# Patient Record
Sex: Female | Born: 1950
Health system: Southern US, Community
[De-identification: ages and names within clinical notes are randomized; demographics above are authoritative.]

## PROBLEM LIST (undated history)

## (undated) DIAGNOSIS — I10 Essential (primary) hypertension: Secondary | ICD-10-CM

## (undated) DIAGNOSIS — M1990 Unspecified osteoarthritis, unspecified site: Secondary | ICD-10-CM

## (undated) DIAGNOSIS — Z85118 Personal history of other malignant neoplasm of bronchus and lung: Secondary | ICD-10-CM

## (undated) DIAGNOSIS — Z95828 Presence of other vascular implants and grafts: Secondary | ICD-10-CM

## (undated) DIAGNOSIS — K219 Gastro-esophageal reflux disease without esophagitis: Secondary | ICD-10-CM

## (undated) DIAGNOSIS — R0600 Dyspnea, unspecified: Secondary | ICD-10-CM

## (undated) DIAGNOSIS — D649 Anemia, unspecified: Secondary | ICD-10-CM

## (undated) DIAGNOSIS — IMO0001 Reserved for inherently not codable concepts without codable children: Secondary | ICD-10-CM

## (undated) DIAGNOSIS — I2510 Atherosclerotic heart disease of native coronary artery without angina pectoris: Secondary | ICD-10-CM

## (undated) DIAGNOSIS — E119 Type 2 diabetes mellitus without complications: Secondary | ICD-10-CM

## (undated) DIAGNOSIS — I350 Nonrheumatic aortic (valve) stenosis: Secondary | ICD-10-CM

## (undated) DIAGNOSIS — C801 Malignant (primary) neoplasm, unspecified: Secondary | ICD-10-CM

## (undated) HISTORY — DX: Essential (primary) hypertension: I10

## (undated) HISTORY — PX: GASTRIC BYPASS: SHX52

## (undated) HISTORY — DX: Nonrheumatic aortic (valve) stenosis: I35.0

## (undated) HISTORY — PX: LAPAROSCOPIC SALPINGOOPHERECTOMY: SUR795

## (undated) HISTORY — PX: VAGINAL HYSTERECTOMY: SUR661

## (undated) HISTORY — DX: Type 2 diabetes mellitus without complications: E11.9

## (undated) HISTORY — DX: Anemia, unspecified: D64.9

## (undated) HISTORY — DX: Personal history of other malignant neoplasm of bronchus and lung: Z85.118

## (undated) HISTORY — DX: Atherosclerotic heart disease of native coronary artery without angina pectoris: I25.10

## (undated) HISTORY — PX: COLONOSCOPY: SHX174

---
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
## 1995-03-21 HISTORY — PX: CHOLECYSTECTOMY: SHX55

## 2000-07-12 ENCOUNTER — Other Ambulatory Visit: Admission: RE | Admit: 2000-07-12 | Discharge: 2000-07-12 | Payer: Self-pay | Admitting: Internal Medicine

## 2001-06-12 ENCOUNTER — Encounter: Payer: Self-pay | Admitting: Family Medicine

## 2001-06-12 ENCOUNTER — Ambulatory Visit (HOSPITAL_COMMUNITY): Admission: RE | Admit: 2001-06-12 | Discharge: 2001-06-12 | Payer: Self-pay | Admitting: Family Medicine

## 2001-06-13 ENCOUNTER — Other Ambulatory Visit: Admission: RE | Admit: 2001-06-13 | Discharge: 2001-06-13 | Payer: Self-pay | Admitting: Internal Medicine

## 2003-06-15 ENCOUNTER — Ambulatory Visit (HOSPITAL_COMMUNITY): Admission: RE | Admit: 2003-06-15 | Discharge: 2003-06-15 | Payer: Self-pay | Admitting: Family Medicine

## 2004-06-16 ENCOUNTER — Ambulatory Visit (HOSPITAL_COMMUNITY): Admission: RE | Admit: 2004-06-16 | Discharge: 2004-06-16 | Payer: Self-pay | Admitting: Family Medicine

## 2004-10-14 ENCOUNTER — Ambulatory Visit (HOSPITAL_COMMUNITY): Admission: RE | Admit: 2004-10-14 | Discharge: 2004-10-14 | Payer: Self-pay | Admitting: Family Medicine

## 2004-10-14 IMAGING — CR DG HIP (WITH OR WITHOUT PELVIS) 2-3V*L*
3 series · 3 of 3 positions shown · non-contrast
Comparison: none

CLINICAL DATA: Persistent hip pain since a fall about one month ago.
 LEFT HIP ? 2 VIEWS ? [DATE]: 
 The bony structures of the pelvis and hips appear normal.  There is a calcified fibroid in the left side of the pelvis.

[view not recorded (1 of 3)]
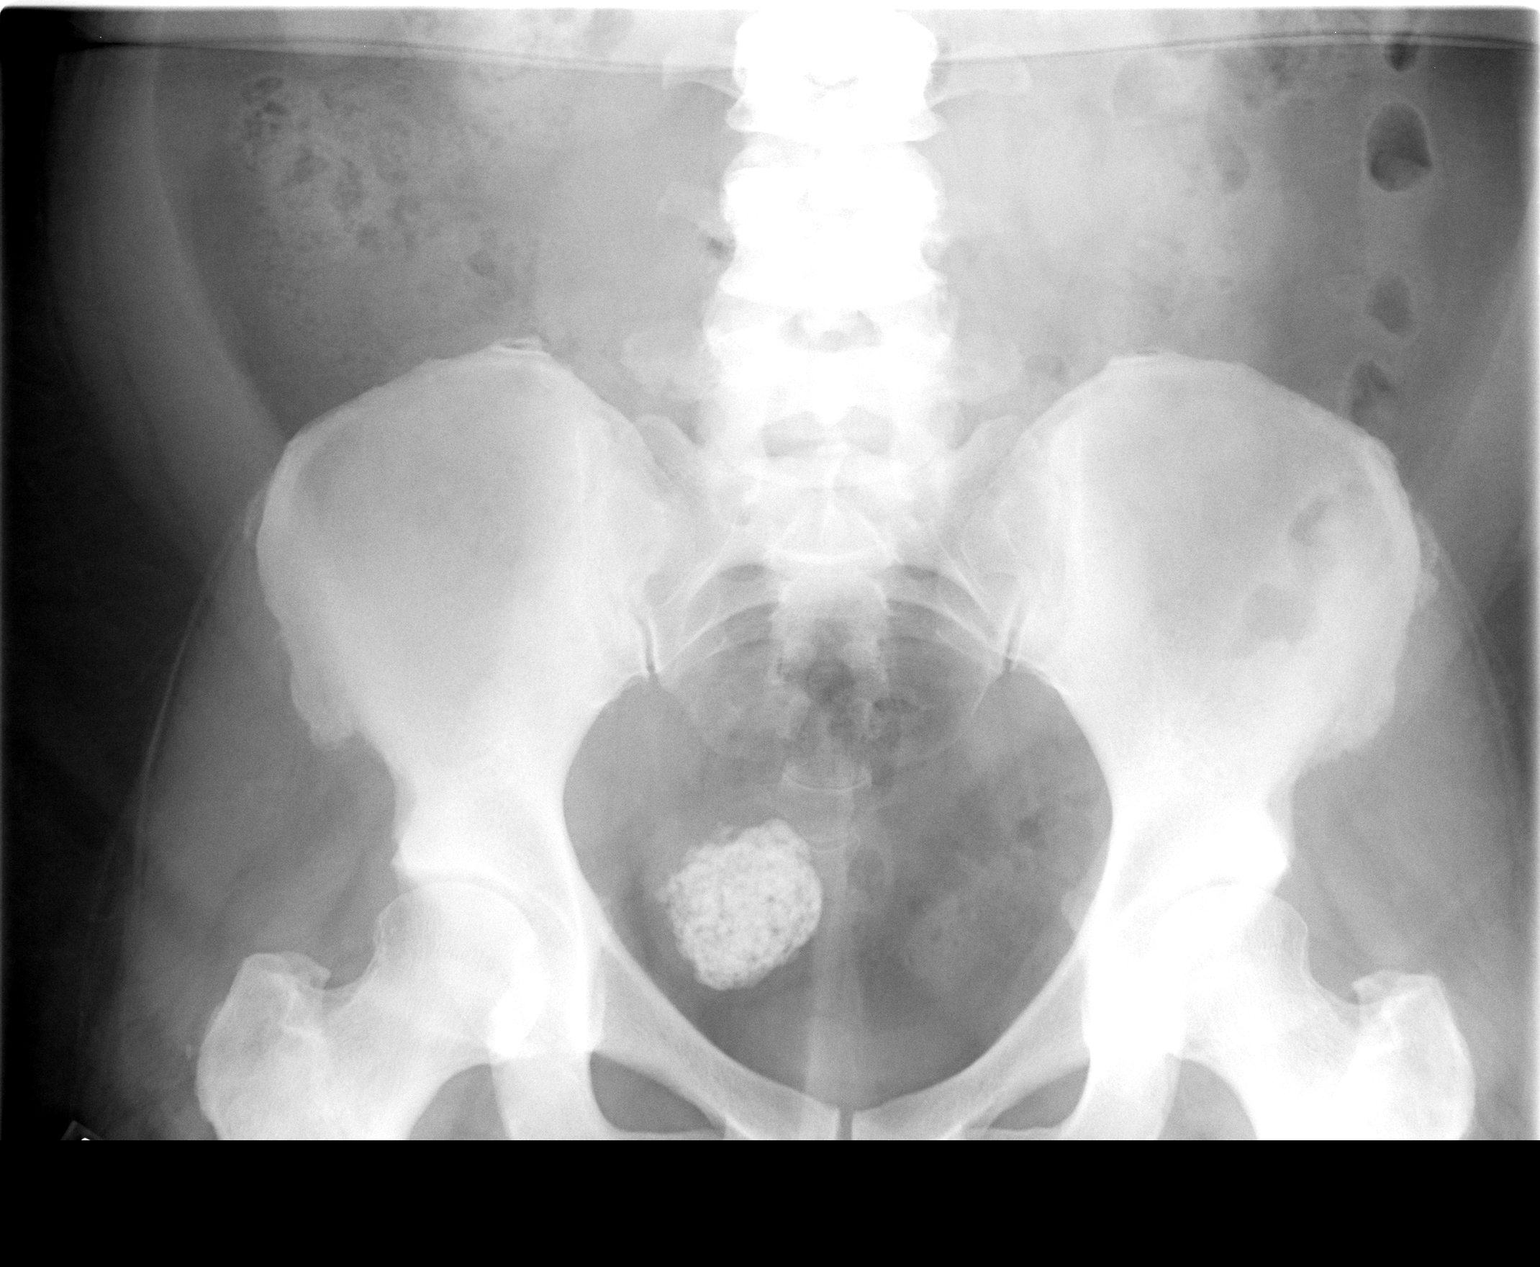

[view not recorded (2 of 3)]
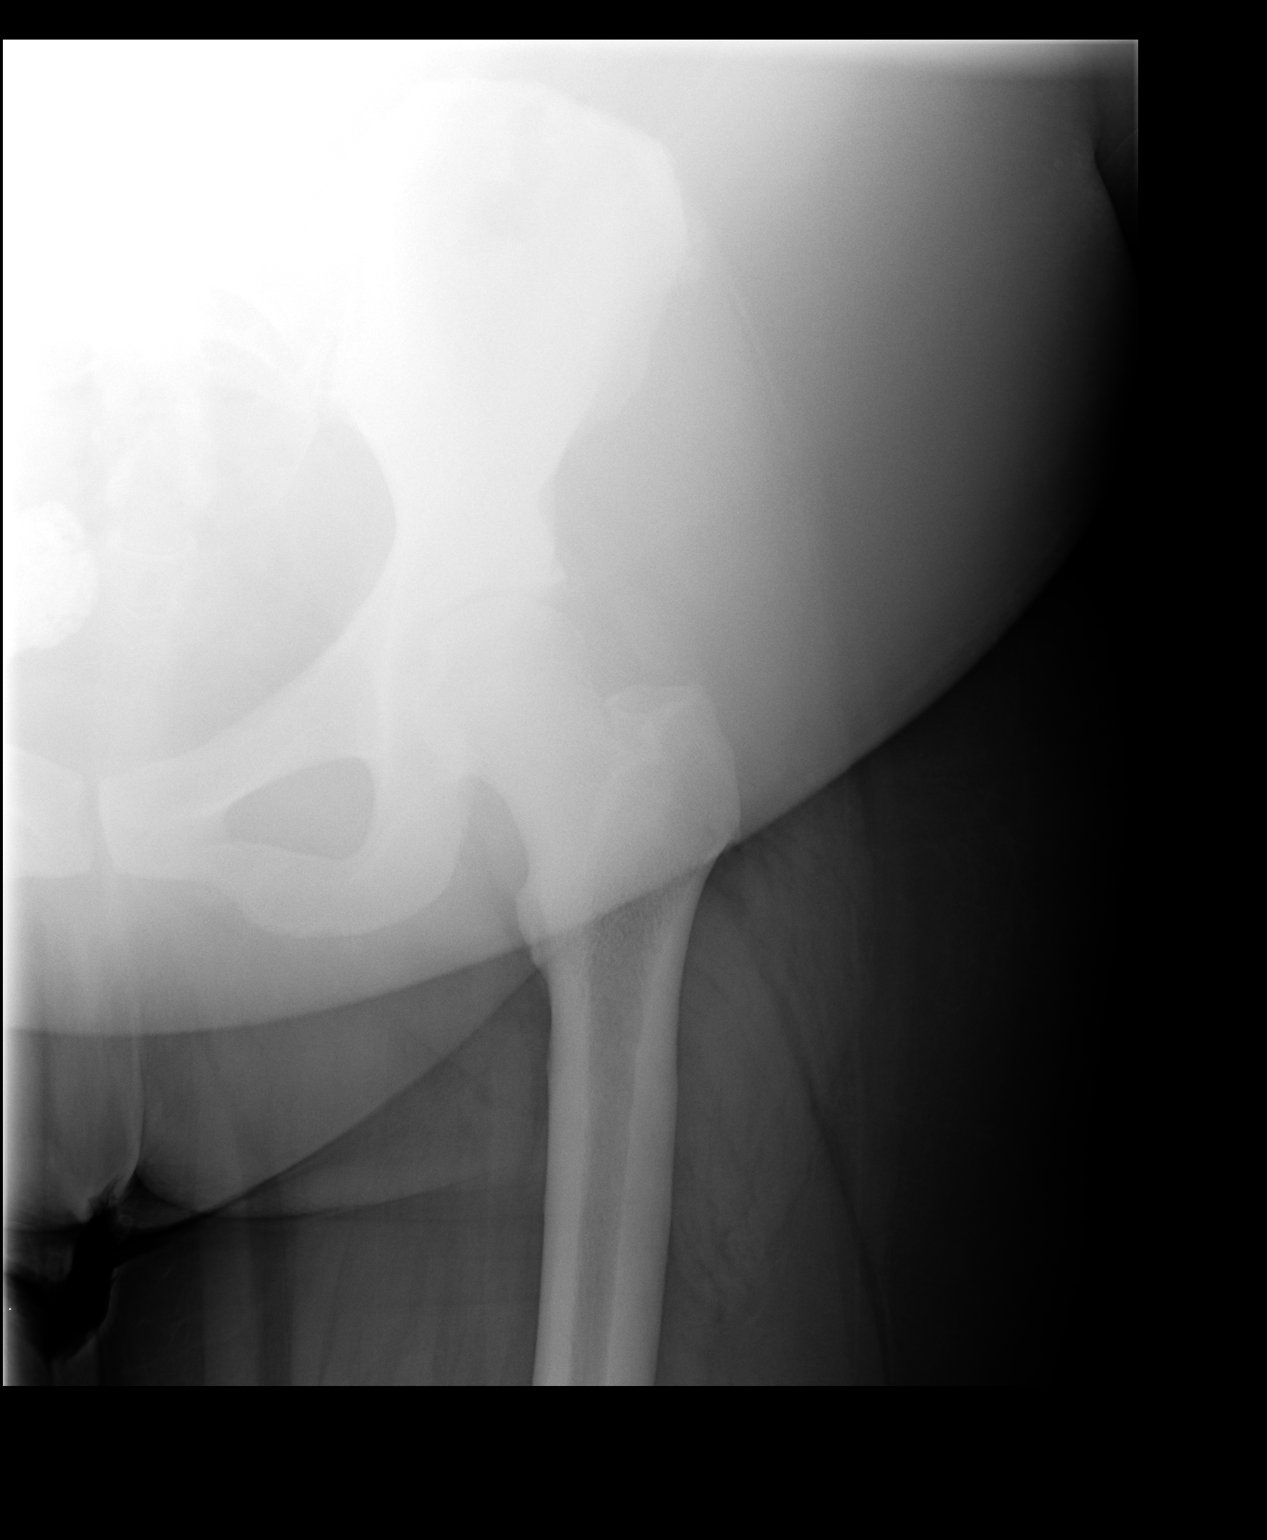

[view not recorded (3 of 3)]
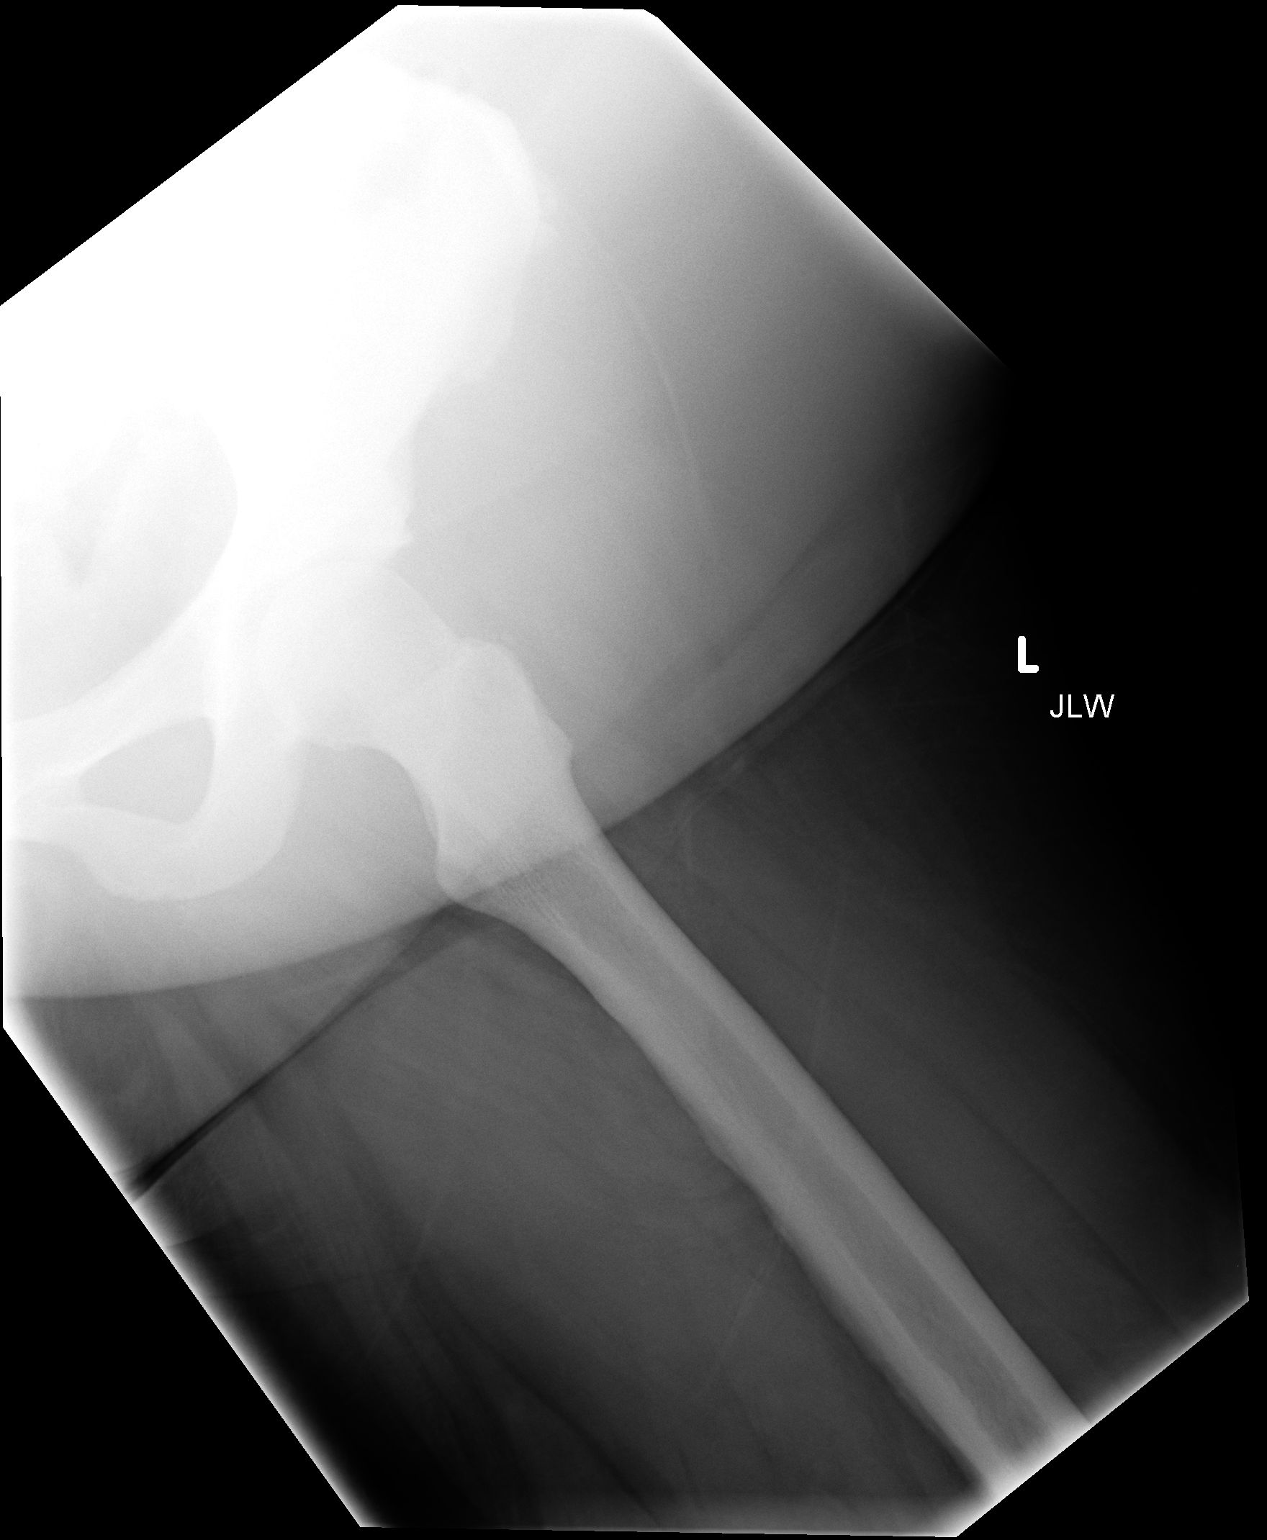

[3 of 3 positions shown; findings below may reference images not displayed]

IMPRESSION: Normal left hip.

## 2004-11-24 ENCOUNTER — Ambulatory Visit (HOSPITAL_COMMUNITY): Admission: RE | Admit: 2004-11-24 | Discharge: 2004-11-24 | Payer: Self-pay | Admitting: General Surgery

## 2004-11-24 ENCOUNTER — Encounter (INDEPENDENT_AMBULATORY_CARE_PROVIDER_SITE_OTHER): Payer: Self-pay | Admitting: General Surgery

## 2005-02-12 IMAGING — CR DG CHEST 2V
2 series · 2 of 2 positions shown · non-contrast
Comparison: none

CLINICAL DATA: Morbid obesity.  Pre-op evaluation for bariatric surgery.  Hypertension.  
 CHEST - 2 VIEW:
 The heart size and mediastinal contours are within normal limits.  Both lungs are clear.  The visualized skeletal structures are unremarkable.

[view not recorded (1 of 2)]
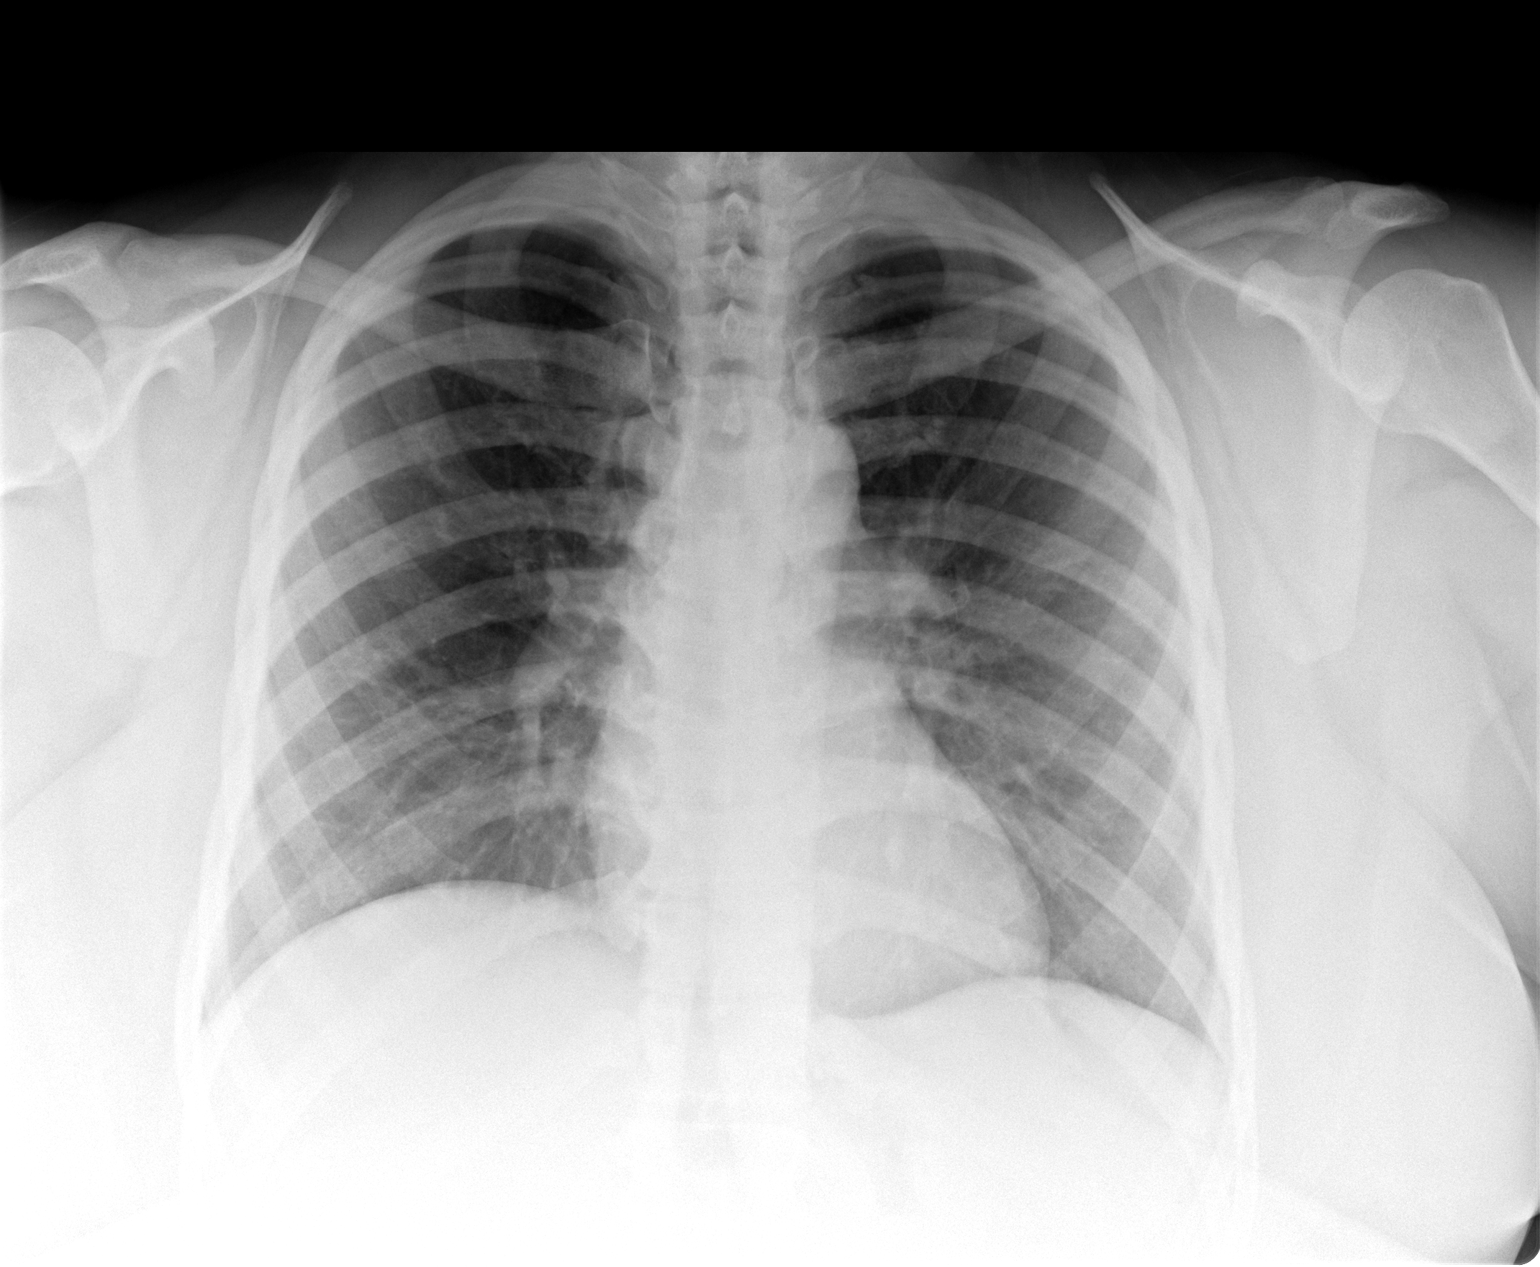

[view not recorded (2 of 2)]
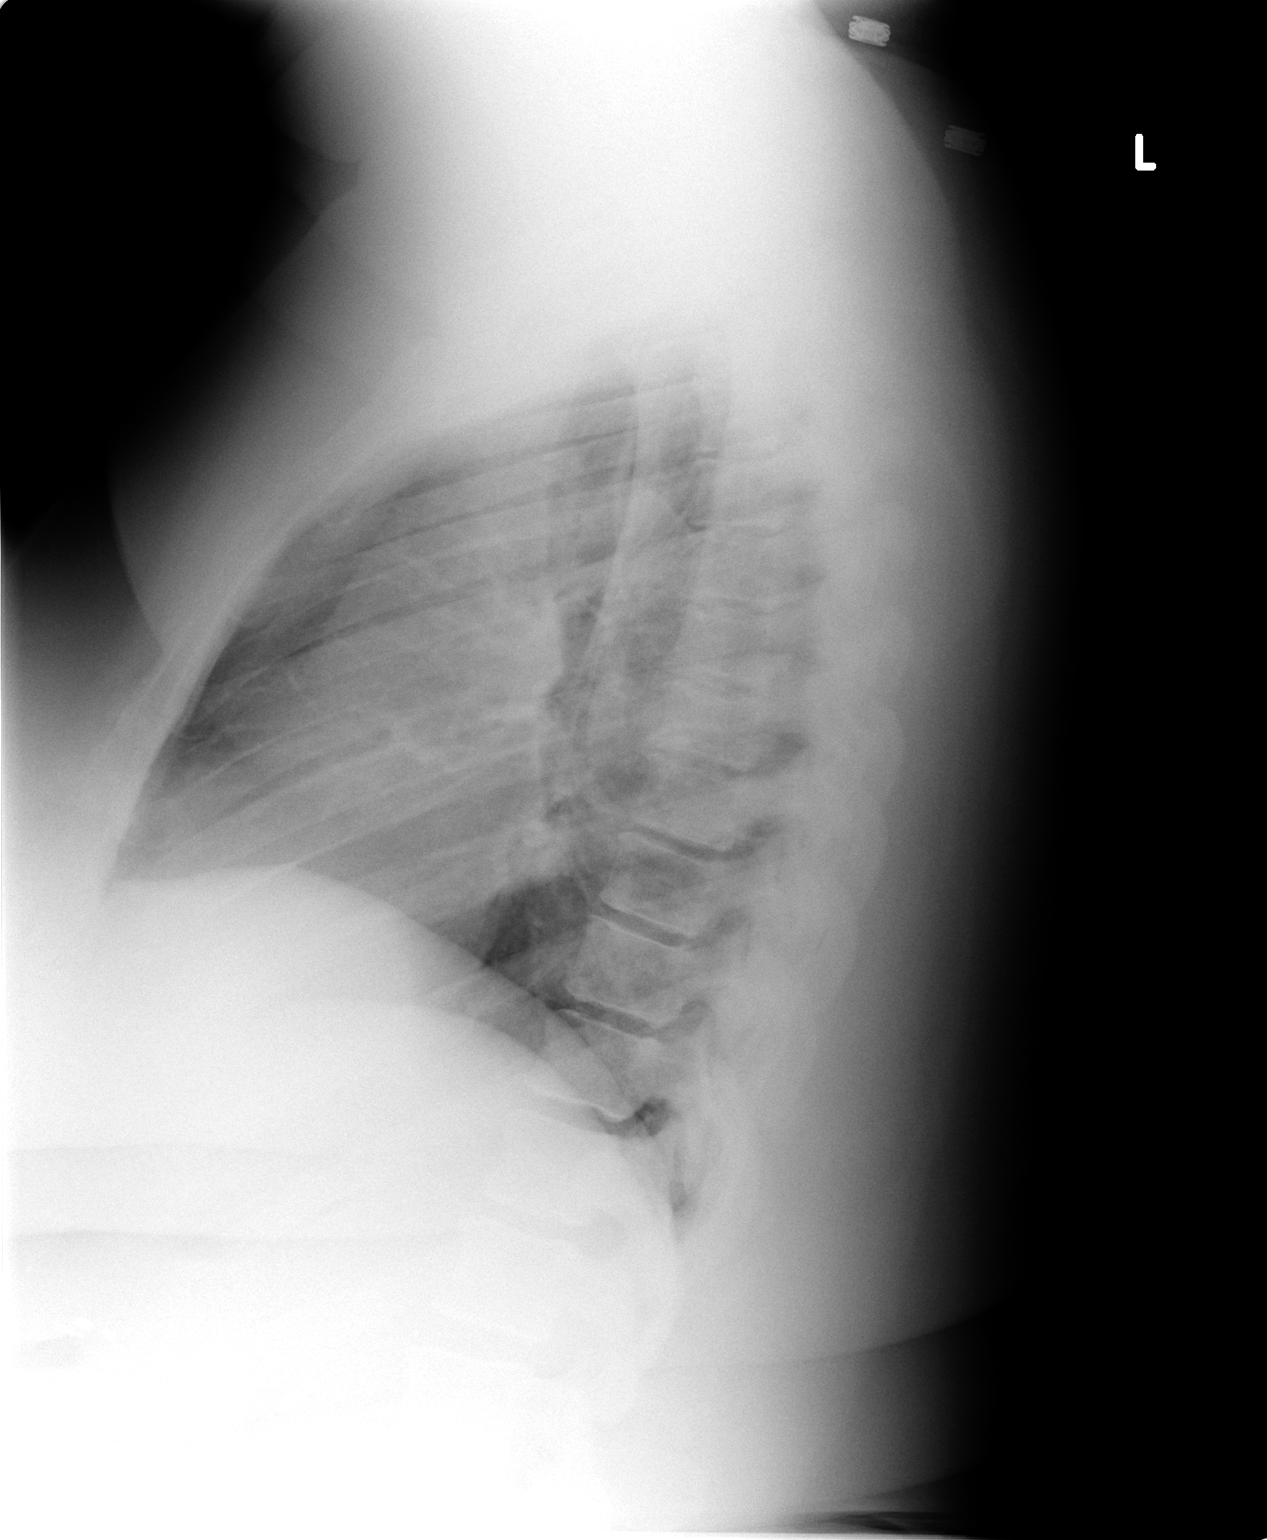

[2 of 2 positions shown; findings below may reference images not displayed]

IMPRESSION: No active cardiopulmonary disease.

## 2005-06-23 ENCOUNTER — Ambulatory Visit (HOSPITAL_COMMUNITY): Admission: RE | Admit: 2005-06-23 | Discharge: 2005-06-23 | Payer: Self-pay | Admitting: Family Medicine

## 2005-07-06 ENCOUNTER — Ambulatory Visit: Payer: Self-pay | Admitting: Orthopedic Surgery

## 2005-10-27 ENCOUNTER — Ambulatory Visit (HOSPITAL_COMMUNITY): Admission: RE | Admit: 2005-10-27 | Discharge: 2005-10-27 | Payer: Self-pay | Admitting: Obstetrics and Gynecology

## 2005-10-30 ENCOUNTER — Ambulatory Visit (HOSPITAL_COMMUNITY): Admission: RE | Admit: 2005-10-30 | Discharge: 2005-10-30 | Payer: Self-pay | Admitting: Surgery

## 2005-11-08 ENCOUNTER — Encounter: Admission: RE | Admit: 2005-11-08 | Discharge: 2006-02-06 | Payer: Self-pay | Admitting: Surgery

## 2006-02-01 ENCOUNTER — Ambulatory Visit: Payer: Self-pay | Admitting: Orthopedic Surgery

## 2006-02-13 ENCOUNTER — Ambulatory Visit (HOSPITAL_COMMUNITY): Admission: RE | Admit: 2006-02-13 | Discharge: 2006-02-13 | Payer: Self-pay | Admitting: Orthopedic Surgery

## 2006-02-13 IMAGING — CT CT PELVIS W/ CM
1 of 2 series · 14 of 32 positions shown, 20 images · IV contrast (omnipaque)
Comparison: Outside films done at [REDACTED] on
[DATE].

CLINICAL DATA: Mass seen on outside lumbar spine films

PELVIS CT WITH CONTRAST
TECHNIQUE: Multidetector CT imaging of the pelvis was performed following the
standard protocol during administration of intravenous contrast.
Contrast:  150 cc Omnipaque 300

[Series 4365: — · axial · 0.87mm/px · z∈[+1372,+1622]mm · 14 of 58 slices shown, 20 images]
[im 4/58  soft-tissue]
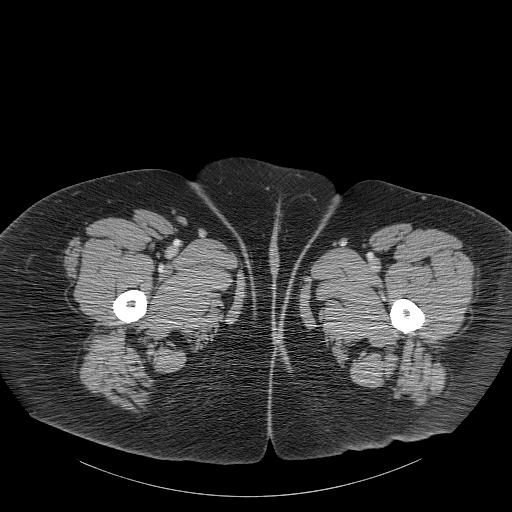
[im 4/58  bone]
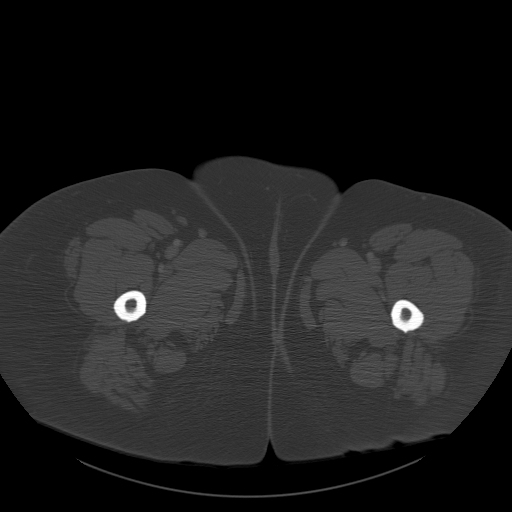
[im 8/58  soft-tissue]
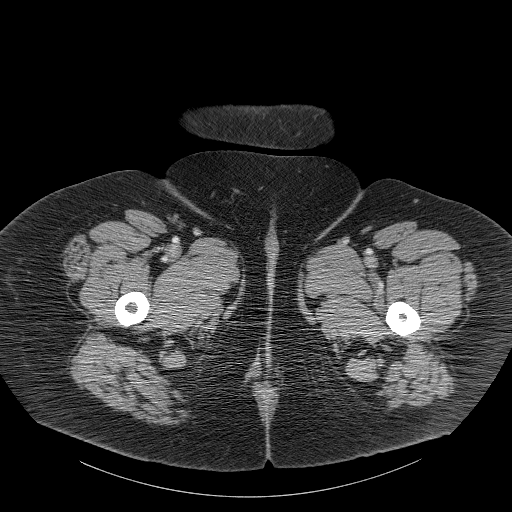
[im 12/58  soft-tissue]
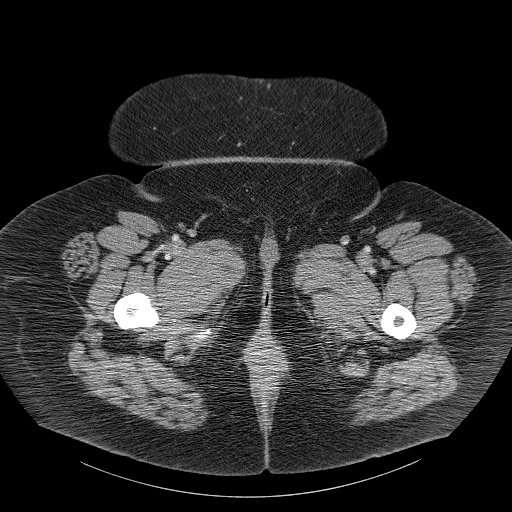
[im 16/58  soft-tissue]
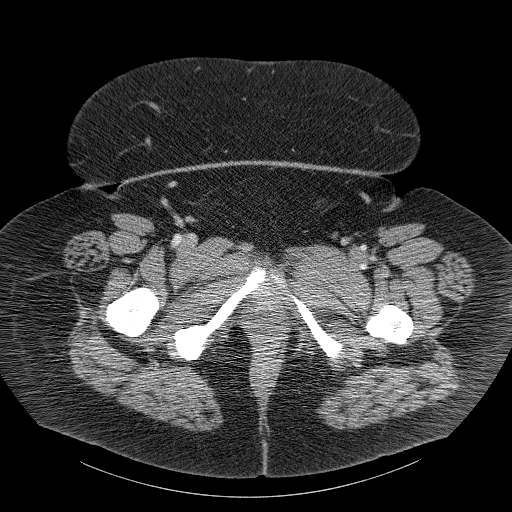
[im 20/58  soft-tissue]
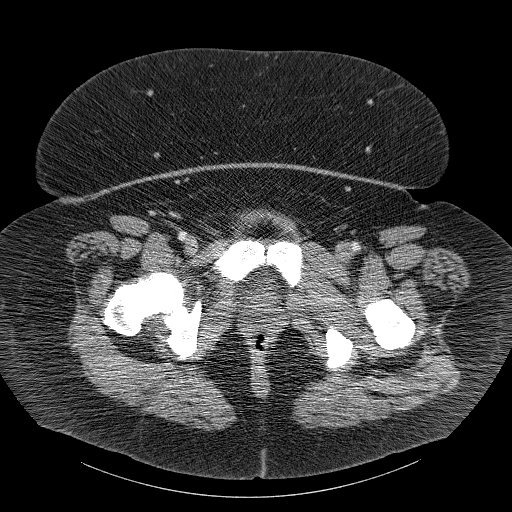
[im 23/58  soft-tissue]
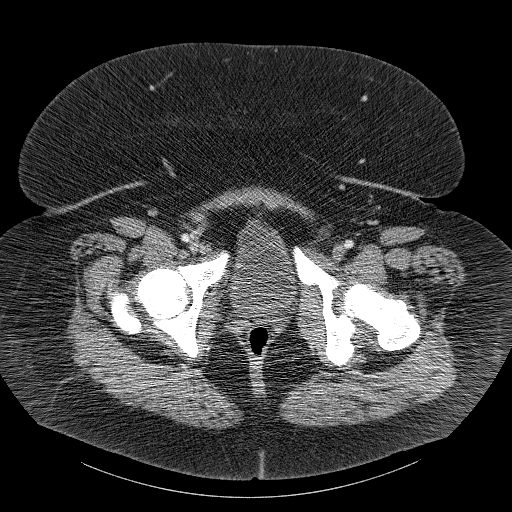
[im 27/58  soft-tissue]
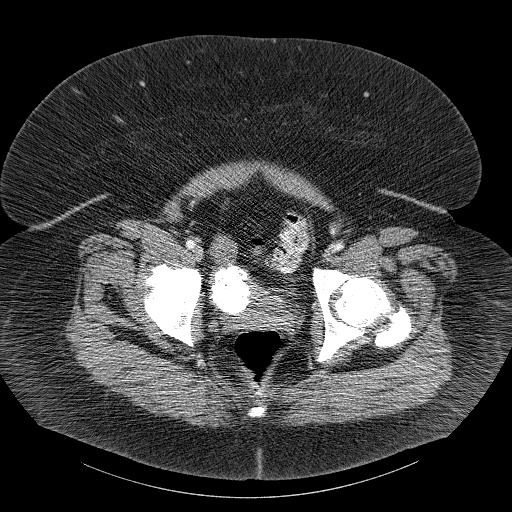
[im 31/58  soft-tissue]
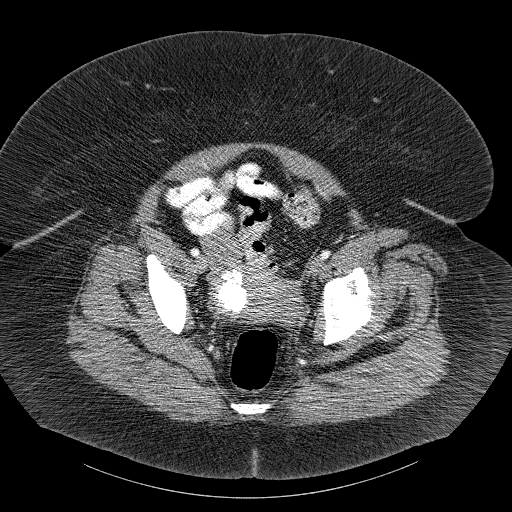
[im 35/58  soft-tissue]
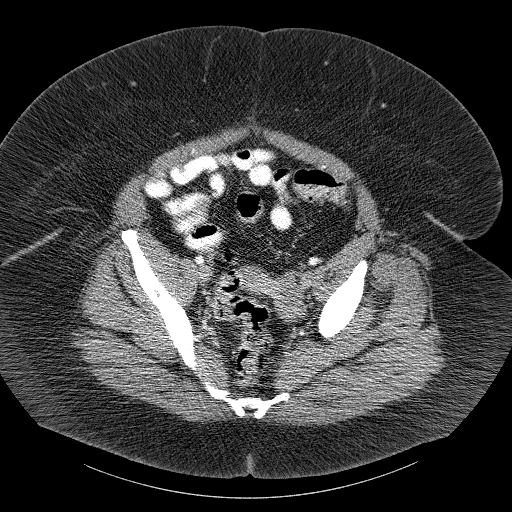
[im 35/58  bone]
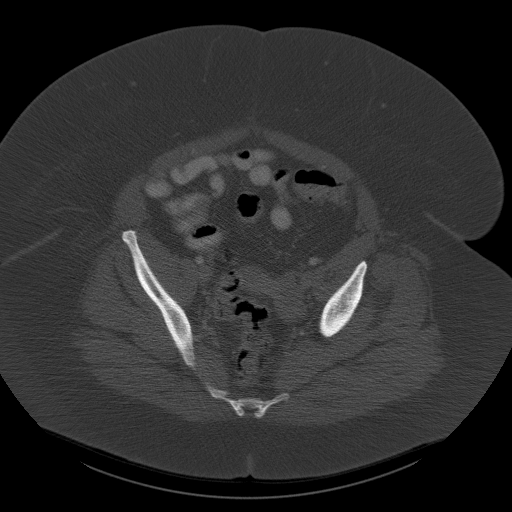
[im 39/58  soft-tissue]
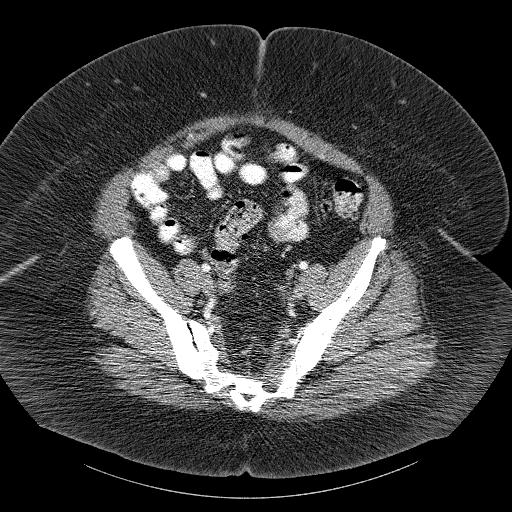
[im 42/58  soft-tissue]
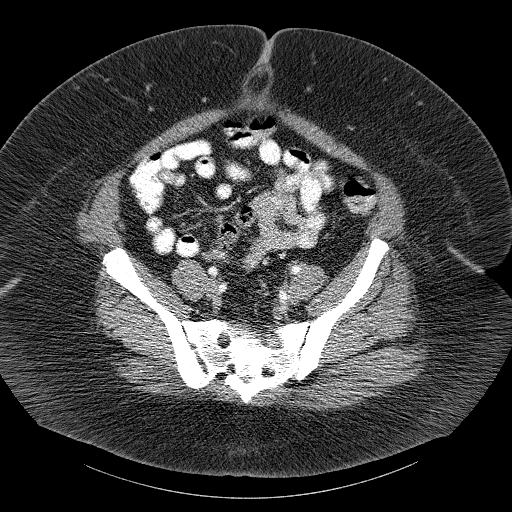
[im 42/58  lung]
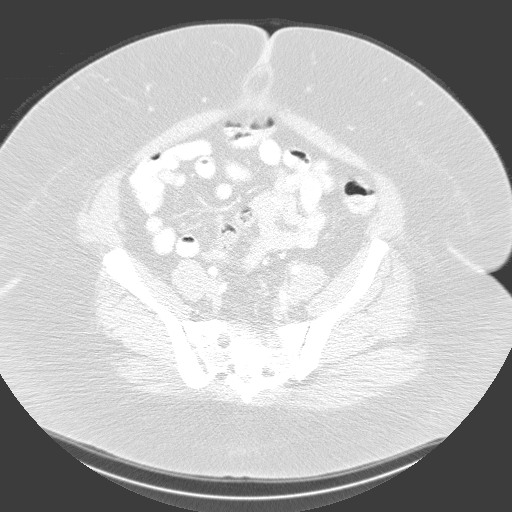
[im 46/58  soft-tissue]
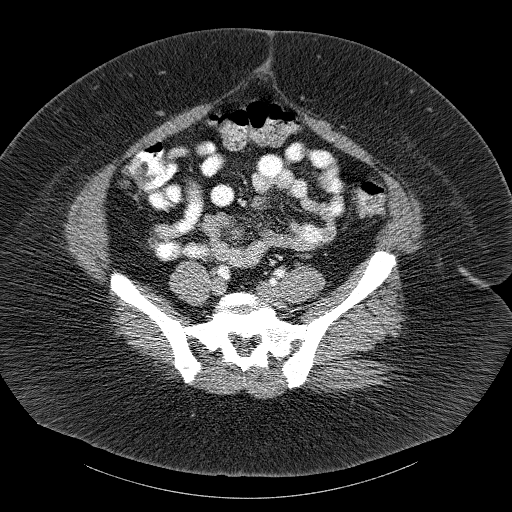
[im 46/58  lung]
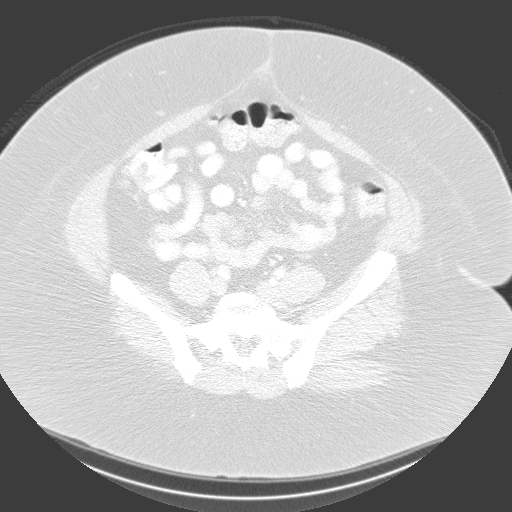
[im 50/58  soft-tissue]
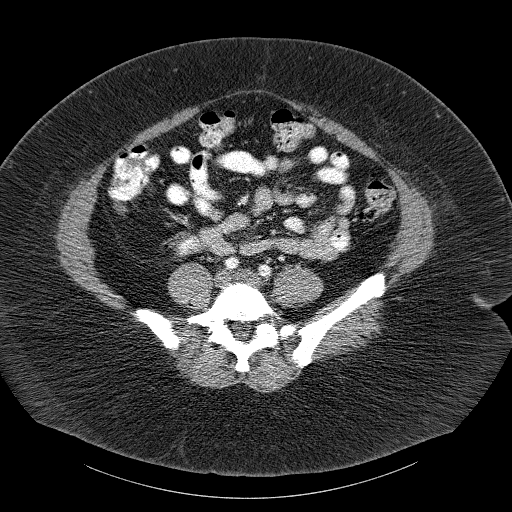
[im 50/58  lung]
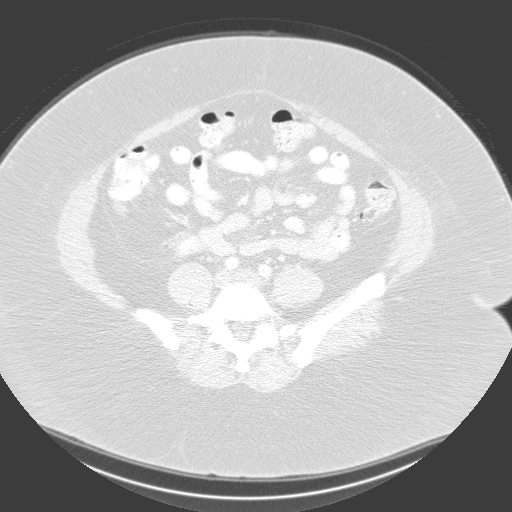
[im 54/58  soft-tissue]
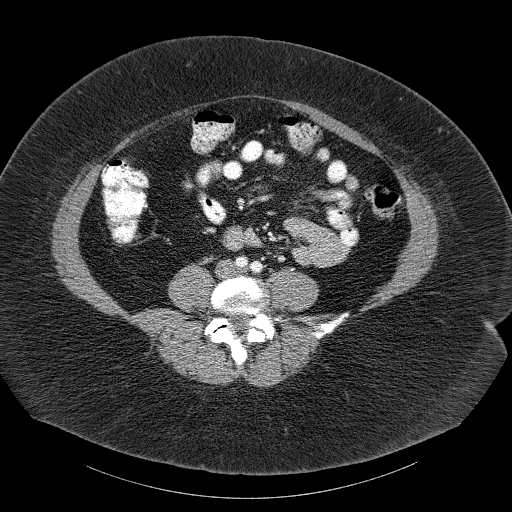
[im 54/58  lung]
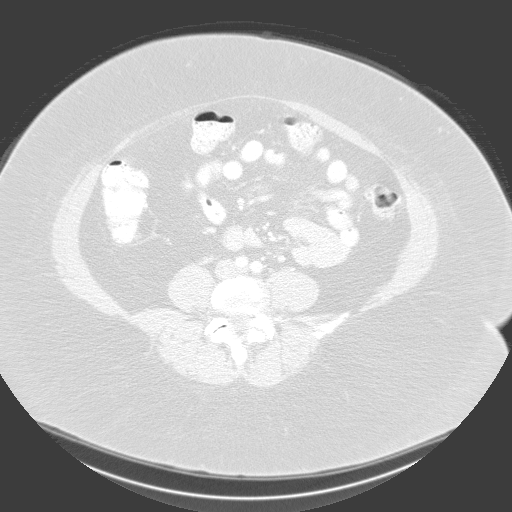

[14 of 32 positions shown; findings below may reference images not displayed]

FINDINGS: The calcified mass seen on outside plain films corresponds to a
calcified fibroid within the uterus. No other masses seen. Ovaries are
unremarkable. No free fluid, free air, or adenopathy. Bowel grossly
unremarkable. Bony structures unremarkable.

IMPRESSION

Mass seen on outside x-rays represents a calcified fibroid within the uterus.

No acute findings.

## 2006-02-15 ENCOUNTER — Encounter (HOSPITAL_COMMUNITY): Admission: RE | Admit: 2006-02-15 | Discharge: 2006-03-17 | Payer: Self-pay | Admitting: Orthopedic Surgery

## 2006-03-26 ENCOUNTER — Ambulatory Visit: Payer: Self-pay | Admitting: Orthopedic Surgery

## 2006-04-06 ENCOUNTER — Encounter (HOSPITAL_COMMUNITY): Admission: RE | Admit: 2006-04-06 | Discharge: 2006-05-06 | Payer: Self-pay | Admitting: Obstetrics and Gynecology

## 2006-04-13 IMAGING — NM NM CARBON 14-UREA BREATH TEST
1 series · 1 of 1 positions shown · non-contrast
Comparison: none

CLINICAL DATA: Preoperative assessment for gastric bypass surgery.
NUCLEAR MEDICINE UREA BREATH TEST:
TECHNIQUE: The patient ingested standardized capsule containing 1 uCi of C-14 labeled urea.

[Series 1: sc scanned · 2.39mm/px · 1 of 1 slices shown]
[im 1/1]
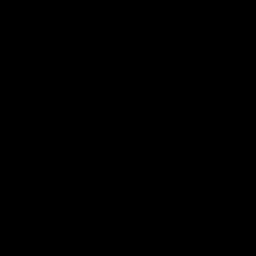

[1 of 1 positions shown; findings below may reference images not displayed]

FINDINGS: Standard breath sample obtained after 10 minutes.
No cleaved C-14 labeled carbon dioxide identified within the breath sample to suggest presence of Helicobacter pylori infection.
IMPRESSION: Negative exam for presence of Helicobacter pylori infection.

## 2006-06-25 ENCOUNTER — Ambulatory Visit (HOSPITAL_COMMUNITY): Admission: RE | Admit: 2006-06-25 | Discharge: 2006-06-25 | Payer: Self-pay | Admitting: Family Medicine

## 2006-07-27 ENCOUNTER — Other Ambulatory Visit: Admission: RE | Admit: 2006-07-27 | Discharge: 2006-07-27 | Payer: Self-pay | Admitting: Family Medicine

## 2006-11-07 ENCOUNTER — Encounter: Admission: RE | Admit: 2006-11-07 | Discharge: 2006-11-07 | Payer: Self-pay | Admitting: Surgery

## 2006-11-20 IMAGING — CR DG CHEST 2V
2 series · 2 of 2 positions shown · non-contrast
Comparison: [DATE]

CLINICAL DATA: Morbid obesity, preop

CHEST - 2 VIEW:

[w chest pa]
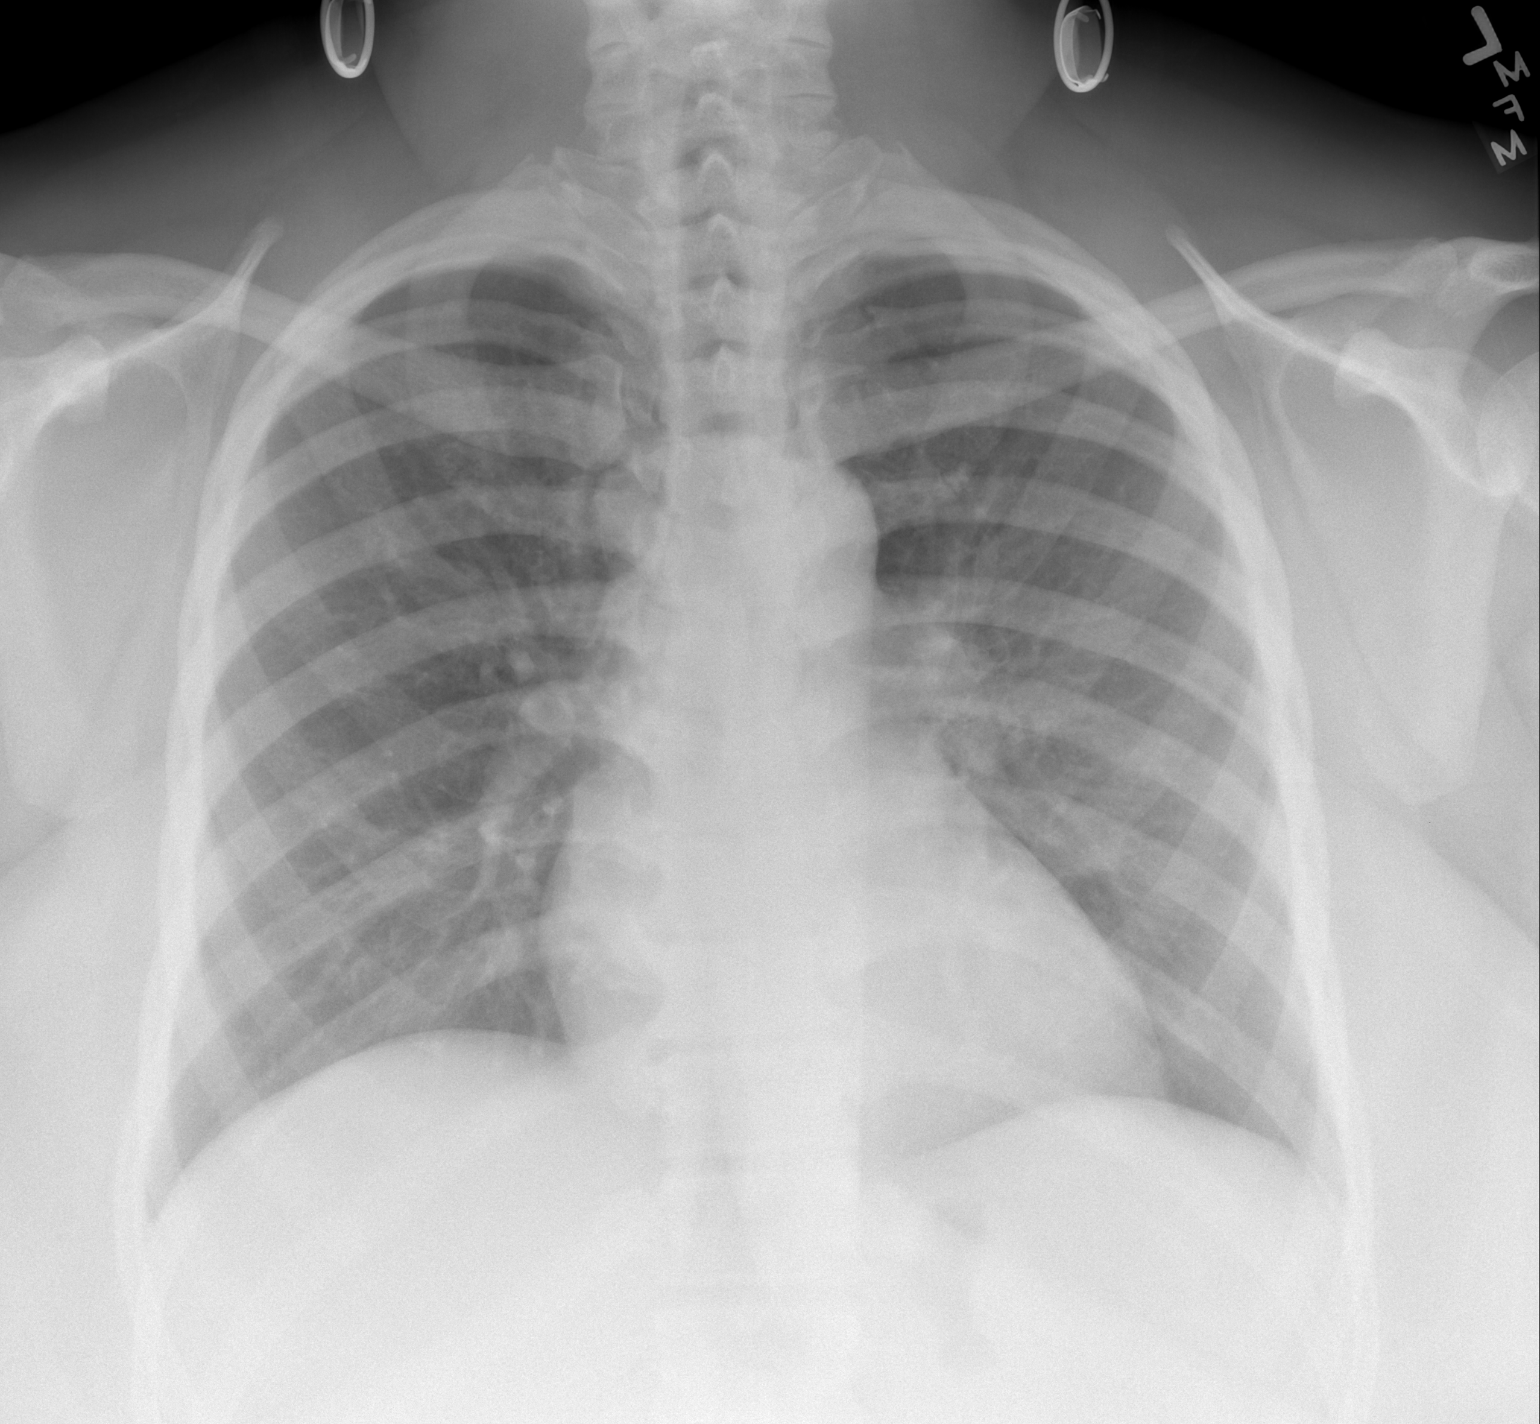

[w chest lat]
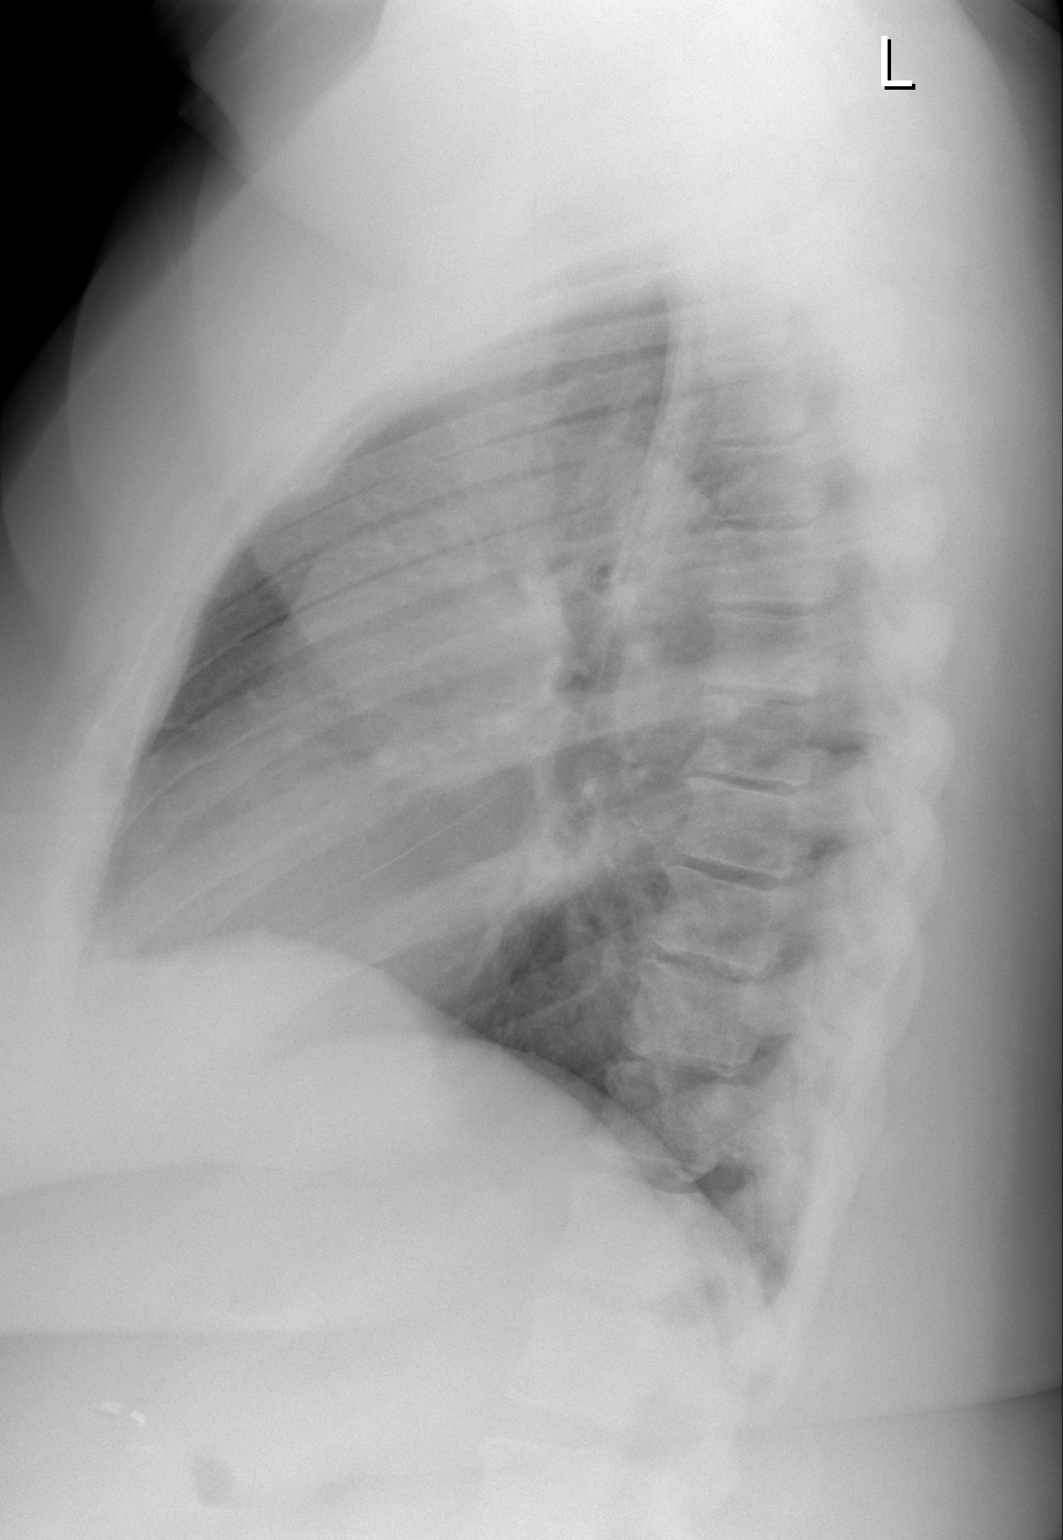

[2 of 2 positions shown; findings below may reference images not displayed]

FINDINGS: The heart size and mediastinal contours are within normal limits. 
Both lungs are clear.  The visualized skeletal structures are unremarkable.
IMPRESSION: No active cardiopulmonary disease

## 2006-11-26 ENCOUNTER — Inpatient Hospital Stay (HOSPITAL_COMMUNITY): Admission: RE | Admit: 2006-11-26 | Discharge: 2006-11-28 | Payer: Self-pay | Admitting: Surgery

## 2006-11-27 IMAGING — RF DG UGI W/ GASTROGRAFIN
14 series · 14 of 14 positions shown · IV contrast (agent unspecified)
Comparison: none

CLINICAL DATA: Morbid obesity/1 day post gastric bypass.
 UPPER GI WITH WATER SOLUBLE CONTRAST:

[Series 1: run · 1 of 1 slices shown (1 of 12)]
[im 1/1]
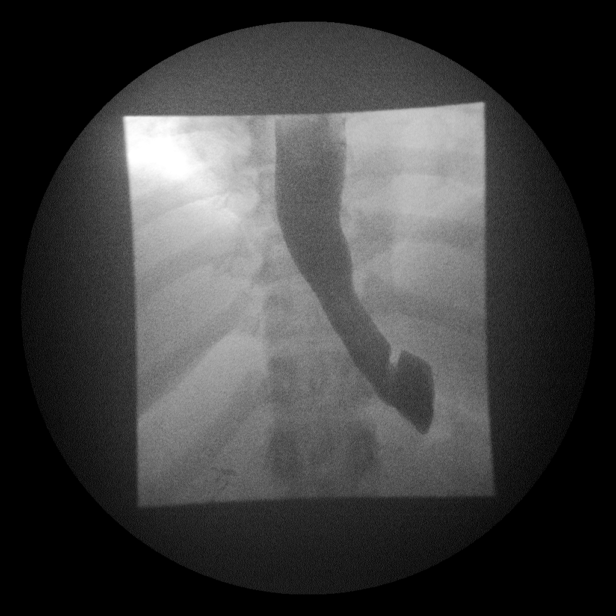

[Series 2: run · 1 of 1 slices shown (2 of 12)]
[im 1/1]
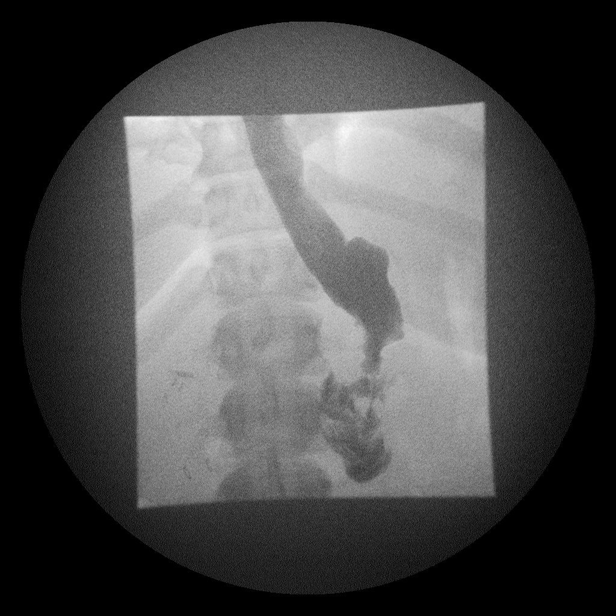

[Series 3: run · 1 of 1 slices shown (3 of 12)]
[im 1/1]
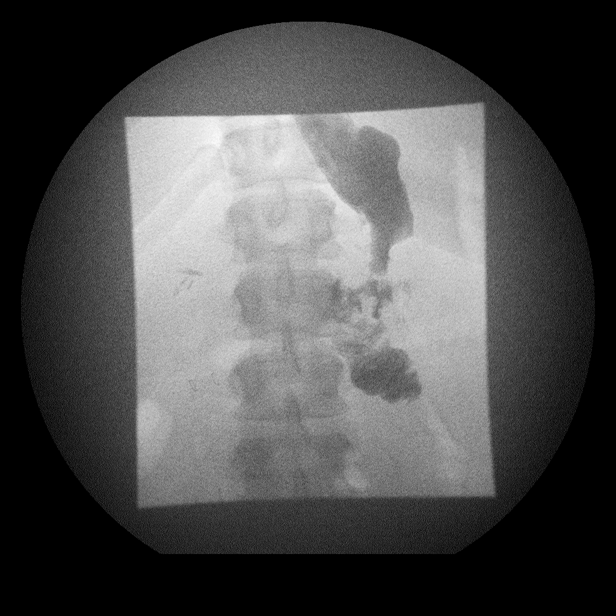

[Series 4: run · 1 of 1 slices shown (4 of 12)]
[im 1/1]
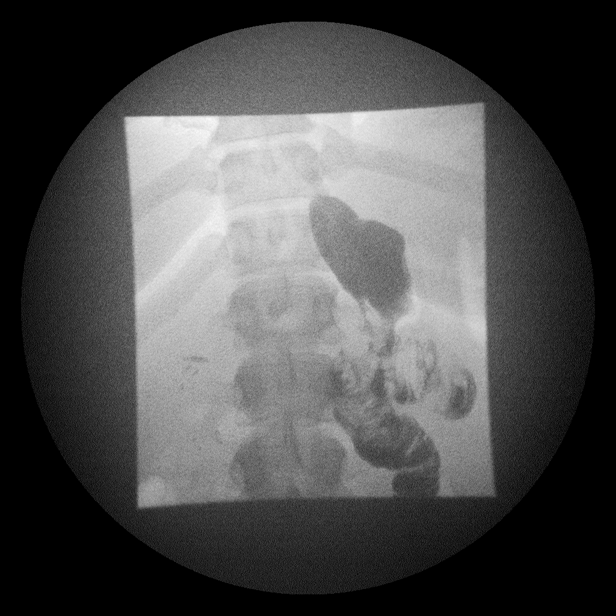

[Series 5: run · 1 of 1 slices shown (5 of 12)]
[im 1/1]
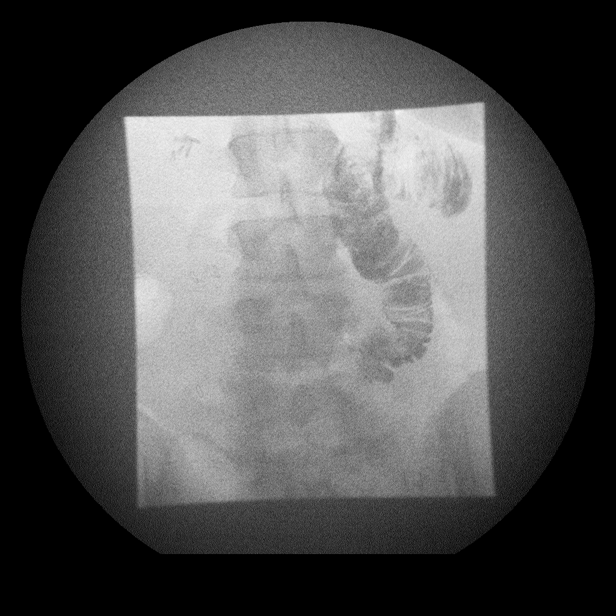

[Series 6: run · 1 of 1 slices shown (6 of 12)]
[im 1/1]
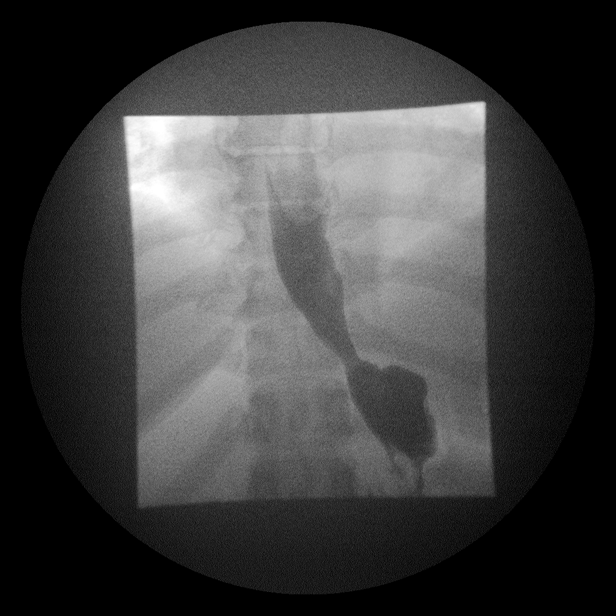

[Series 7: run · 1 of 1 slices shown (7 of 12)]
[im 1/1]
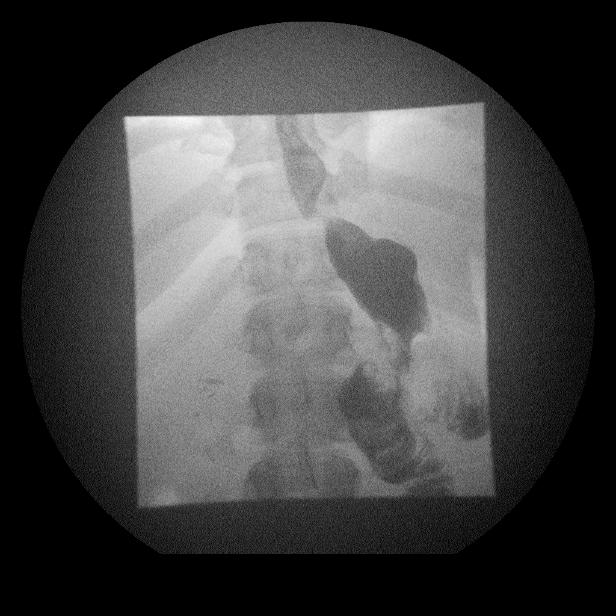

[Series 8: run · 1 of 1 slices shown (8 of 12)]
[im 1/1]
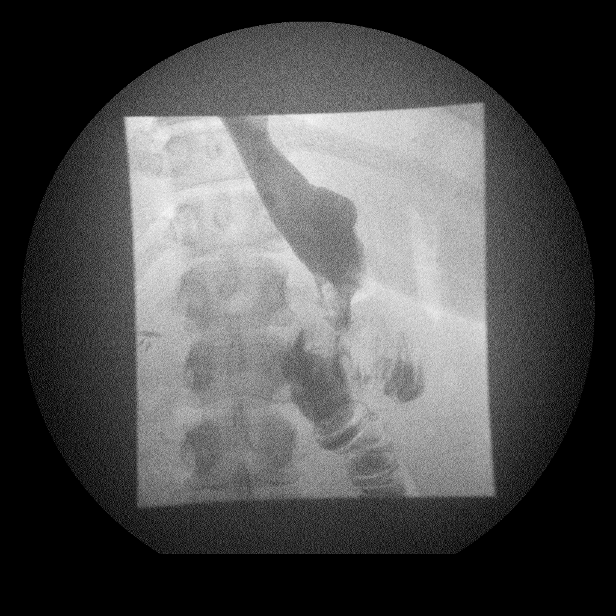

[Series 9: run · 1 of 1 slices shown (9 of 12)]
[im 1/1]
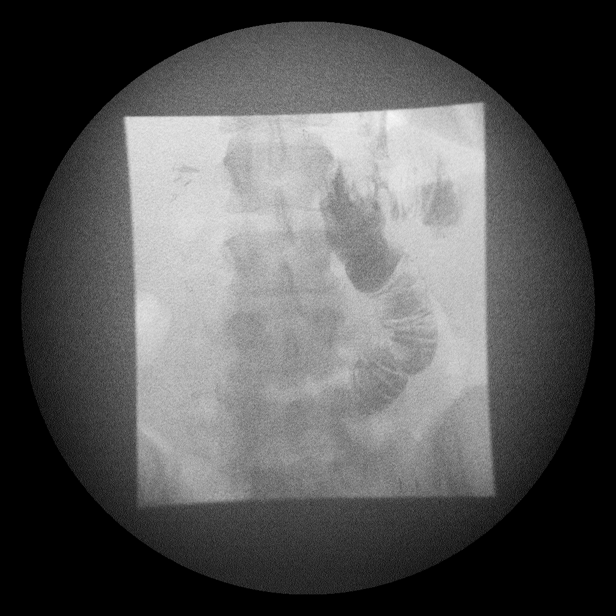

[Series 10: run · 1 of 1 slices shown (10 of 12)]
[im 1/1]
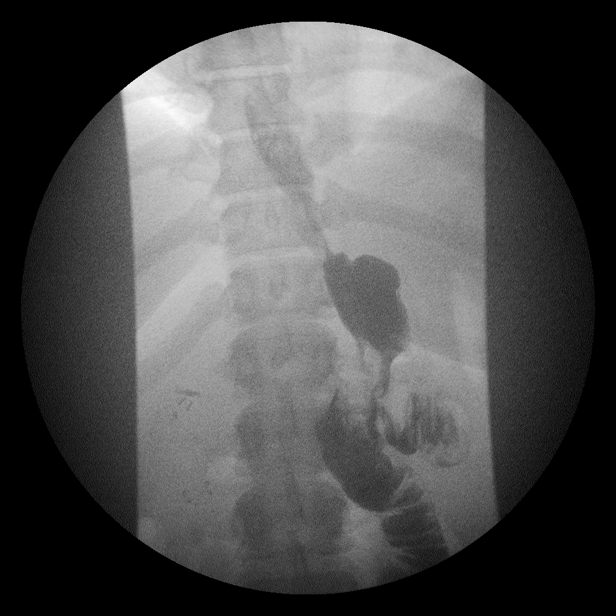

[Series 11: run · 1 of 1 slices shown (11 of 12)]
[im 1/1]
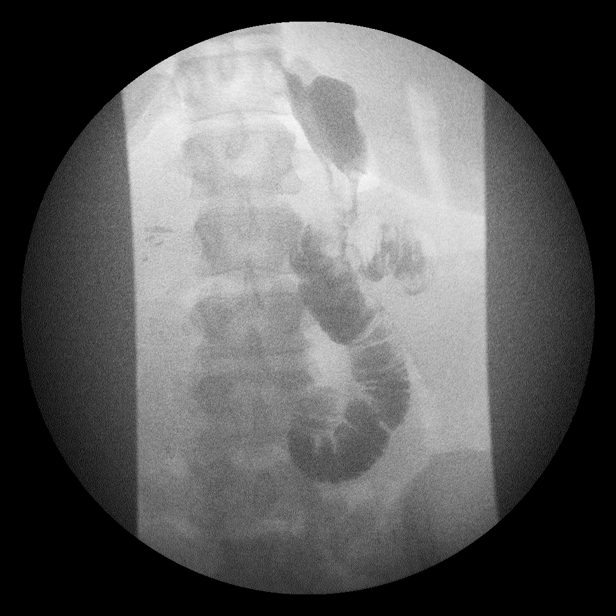

[Series 12: run · 1 of 1 slices shown (12 of 12)]
[im 1/1]
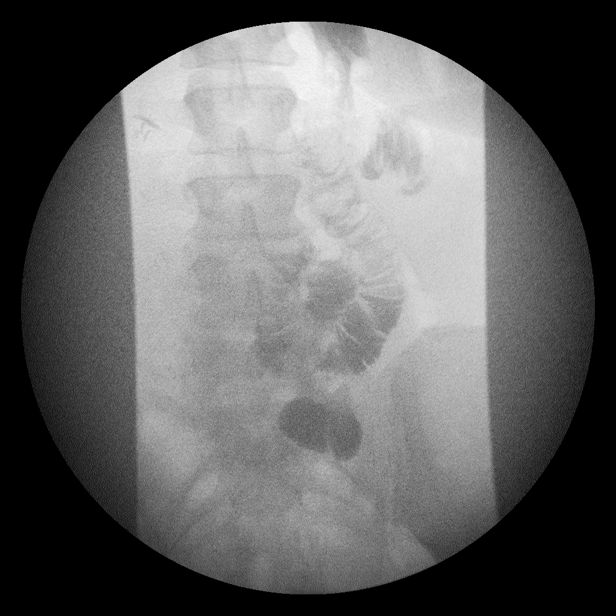

[Series 1001: view not recorded · 0.20mm/px · 1 of 1 slices shown (1 of 2)]
[im 1/1]
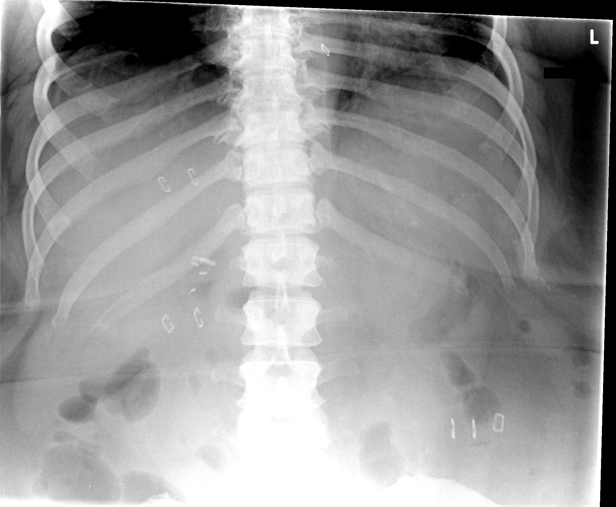

[Series 1002: view not recorded · 0.20mm/px · 1 of 1 slices shown (2 of 2)]
[im 1/1]
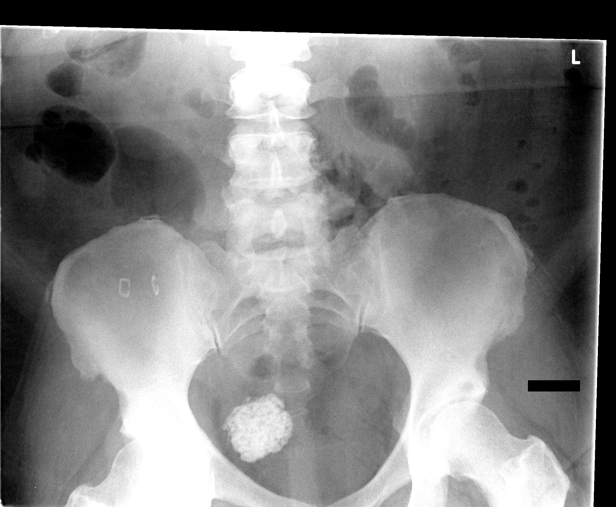

[14 of 14 positions shown; findings below may reference images not displayed]

FINDINGS: Scout view unremarkable except for post surgical clips and skin staples.  
 The patient was given a total of 50 cc of Omnipaque orally.  There is prompt filling of the gastric pouch and prompt emptying into the efferent and afferent loops of the gastrojejunostomy.  There is no extravasation or obstruction.  No filling of the native stomach. 
 The proximal small bowel loops were followed for several minutes.  They are nondilated.  The distal anastomosis is not visualized. 
 One observation noted fluoroscopically is considerable gastroesophageal reflux.
IMPRESSION: 1.  No extravasation or obstruction post gastric bypass.
 2.  Gastroesophageal reflux is noted.

## 2006-12-05 ENCOUNTER — Encounter: Admission: RE | Admit: 2006-12-05 | Discharge: 2007-03-05 | Payer: Self-pay | Admitting: Obstetrics and Gynecology

## 2007-12-20 ENCOUNTER — Ambulatory Visit (HOSPITAL_COMMUNITY): Admission: RE | Admit: 2007-12-20 | Discharge: 2007-12-20 | Payer: Self-pay | Admitting: Family Medicine

## 2009-02-02 ENCOUNTER — Ambulatory Visit: Payer: Self-pay

## 2009-02-02 IMAGING — MG MAM DGTL SCREENING MAMMO W/CAD
1 series · 7 of 7 positions shown · non-contrast
Comparison: none

REASON FOR EXAM: SCREENING MAMMO  CAT 2
COMMENTS:

[Series 273: R CC · right · 7 of 7 slices shown]
[im 1/7]
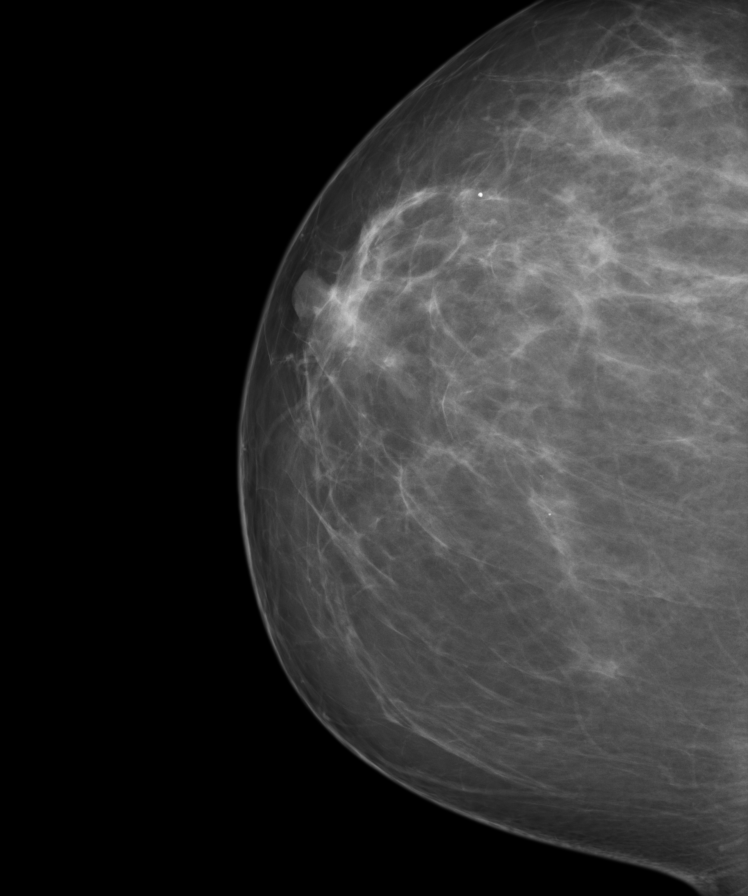
[im 2/7]
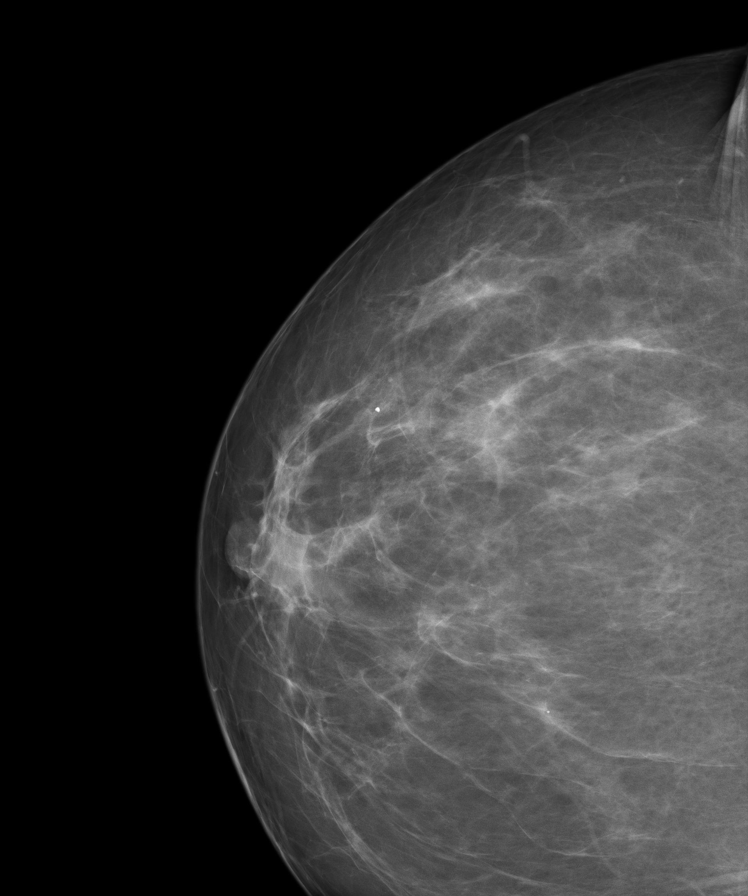
[im 3/7]
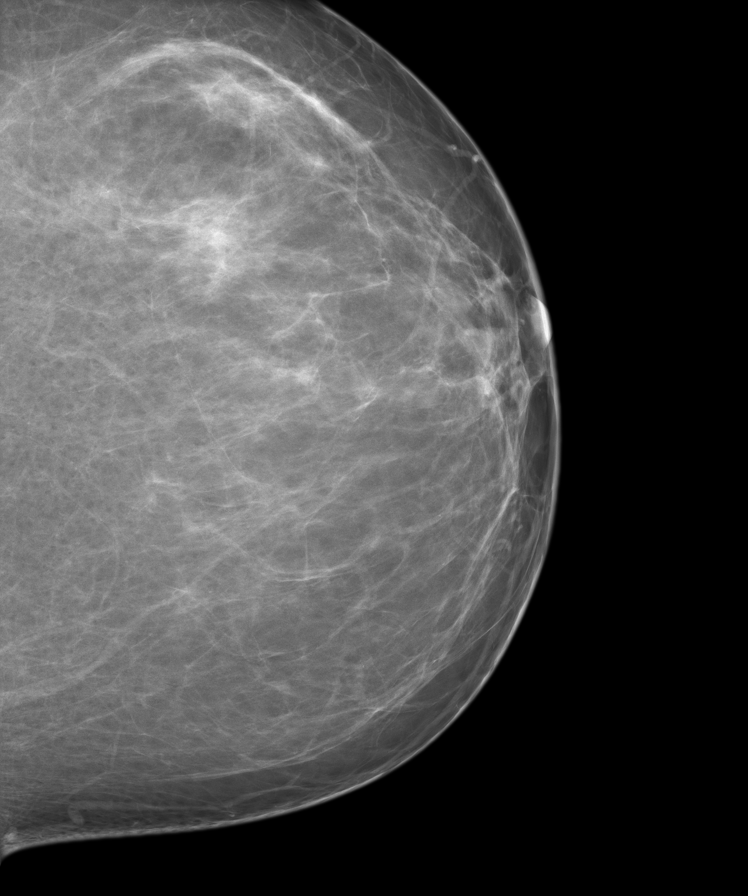
[im 4/7]
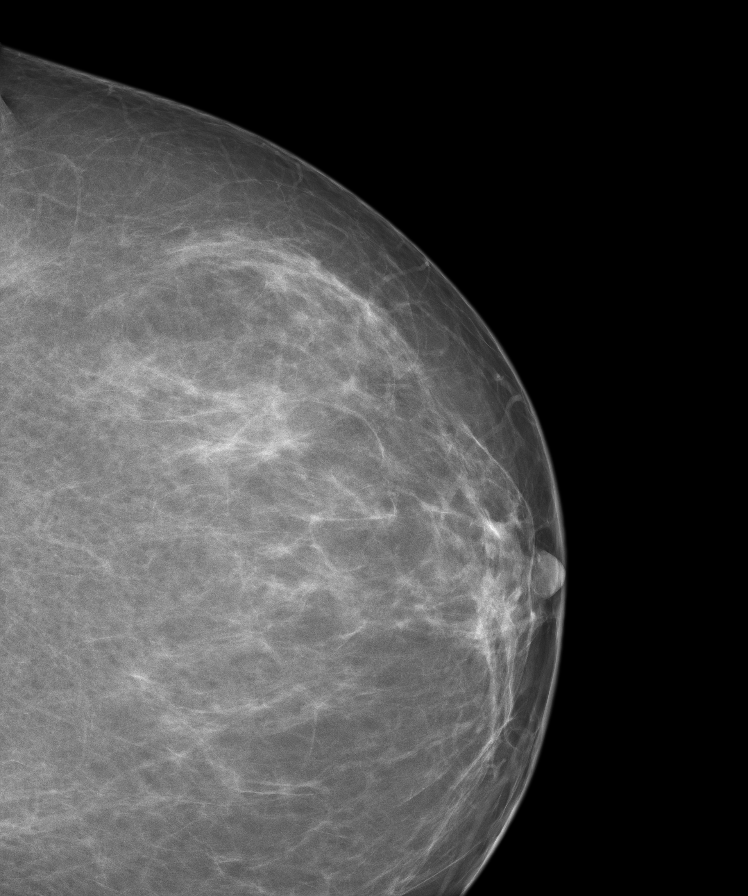
[im 5/7]
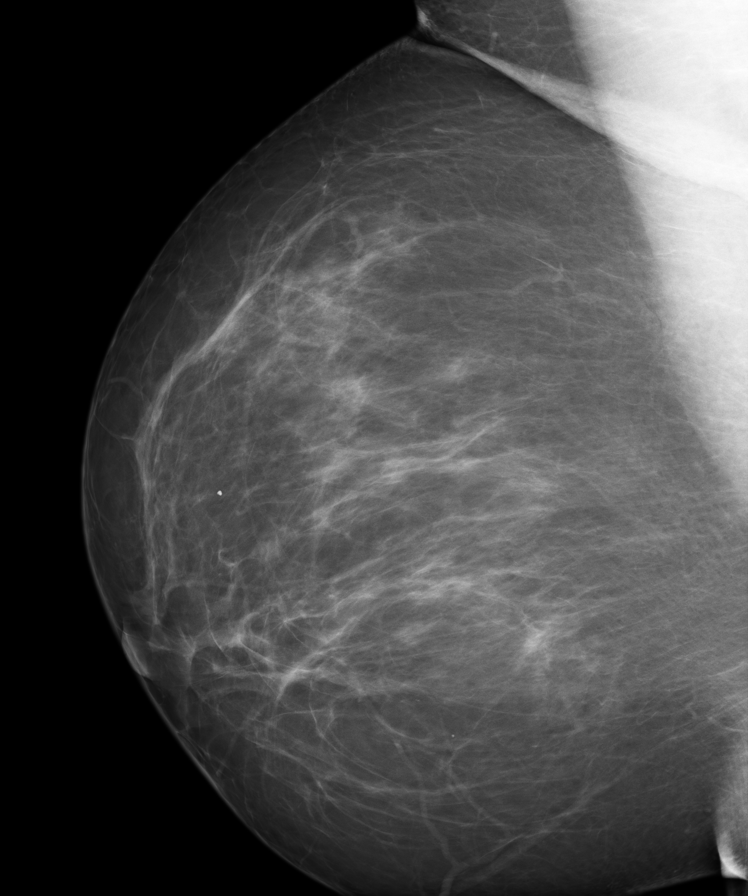
[im 6/7]
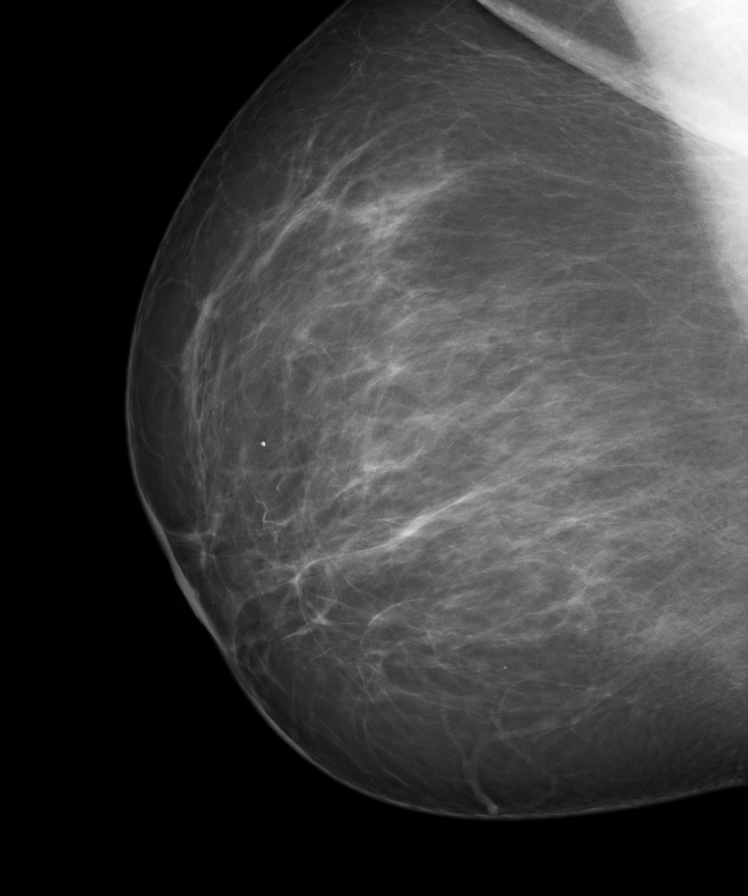
[im 7/7]
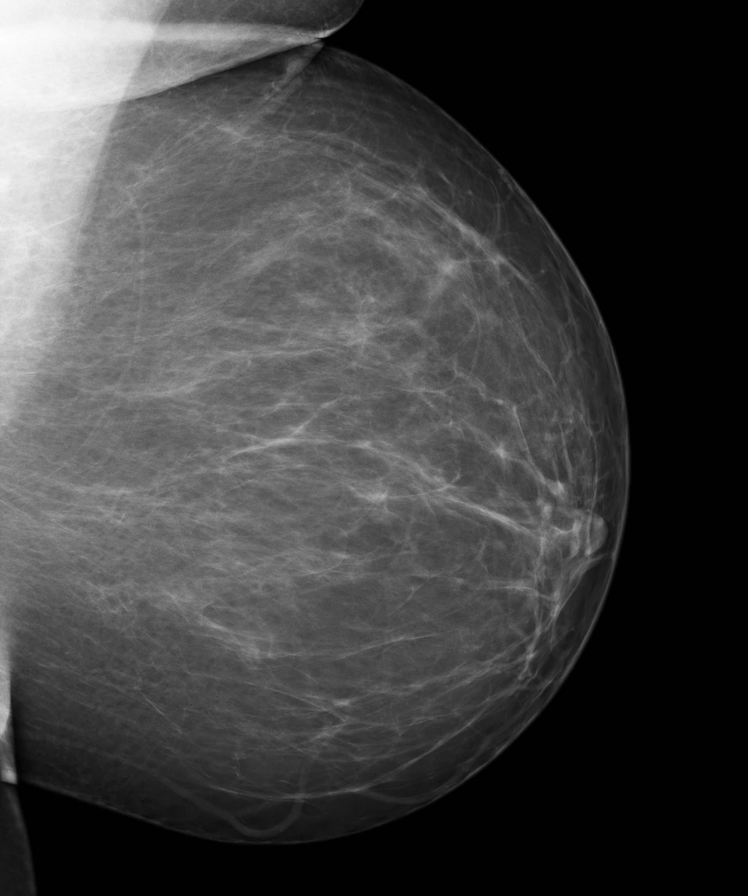

[7 of 7 positions shown; findings below may reference images not displayed]

PROCEDURE:     MAM - MAM DGTL SCREENING MAMMO W/CAD  - [DATE]  [DATE]

RESULT:     Images are submitted on [DATE] for interpretation after
receipt of the previous images dated [DATE] from [facility] in
[HOSPITAL].  There are no other previous exams for comparison. The breasts
exhibit a mildly dense to moderately dense fibroglandular pattern.
Scattered, benign calcifications appear to be present most prominently on
the right.  There is no developing density, dominant mass, or malignant
appearing calcification.
IMPRESSION: 1.Stable, benign appearing bilateral mammogram.

BI-RADS: Category 2 - Benign Findings

RECOMMENDATIONS:

1.     Please continue to encourage yearly mammographic follow-up.

A NEGATIVE MAMMOGRAM REPORT DOES NOT PRECLUDE BIOPSY OR OTHER EVALUATION OF
A CLINICALLY PALPABLE OR OTHERWISE SUSPICIOUS MASS OR LESION. BREAST CANCER
MAY NOT BE DETECTED BY MAMMOGRAPHY IN UP TO 10% OF CASES.

## 2009-02-18 ENCOUNTER — Other Ambulatory Visit: Admission: RE | Admit: 2009-02-18 | Discharge: 2009-02-18 | Payer: Self-pay | Admitting: Family Medicine

## 2010-08-02 NOTE — Op Note (Signed)
Denise [name], Denise [name]                 ACCOUNT NO.:  [id number]   MEDICAL RECORD NO.:  [id number]          PATIENT TYPE:  INP   LOCATION:  1603                         FACILITY:  [facility]   PHYSICIAN:  Sharlet Salina T. Hoxworth, M.D.DATE OF BIRTH:  October 08, 1950   DATE OF PROCEDURE:  11/26/2006  DATE OF DISCHARGE:                               OPERATIVE REPORT   PROCEDURE:  Upper GI endoscopy.   DESCRIPTION OF PROCEDURE:  Upper GI endoscopy is performed  intraoperatively at the completion of laparoscopic Roux-en-Y gastric  bypass by Dr. Ezzard Standing.  The Olympus video endoscope was inserted into the  upper esophagus and then passed under direct vision to the EG junction  at 38 cm.  The esophagus appeared normal.  The gastric pouch was then  entered and was tensely distended with air while the outlet was clamped  by Dr. Ezzard Standing, with no evidence of leakage.  The staple and suture lines  appeared intact, without bleeding.  The anastomosis was visualized and  was patent.  The pouch was a nice tubular pouch and measured 5 cm in  length, with the anastomosis at 43 cm.  Following this, the pouch was  desufflated and the scope withdrawn.      Denise [name]. Hoxworth, M.D.  Electronically Signed     BTH/MEDQ  D:  11/26/2006  T:  11/27/2006  Job:  [id number]

## 2010-08-02 NOTE — Op Note (Signed)
NAMECYRIAH, CHILDREY                 ACCOUNT NO.:  [id number]   MEDICAL RECORD NO.:  [id number]          PATIENT TYPE:  INP   LOCATION:  0001                         [facility]ACILITY:  [facility]   PHYSICIAN:  Sandria Bales. Ezzard Standing, M.D.  DATE OF BIRTH:  10-18-1950   DATE OF PROCEDURE:  11/26/2006  DATE OF DISCHARGE:                               OPERATIVE REPORT   PREOPERATIVE DIAGNOSIS:  Morbid obesity with weight of 280, body mass  index 51.5.   POSTOPERATIVE DIAGNOSIS:  Morbid obesity with weight of 280, body mass  index 51.5.   PROCEDURE:  Laparoscopic Roux-En-Y gastric bypass and upper endoscopy.   SURGEON:  Sandria Bales. Ezzard Standing, M.D.   FIRST ASSISTANT:  Sharlet Salina T. Hoxworth, M.D.   ANESTHESIA:  General endotracheal anesthesia.   ESTIMATED BLOOD LOSS:  Minimal.   INDICATIONS FOR PROCEDURE:  Ms. Carrithers is a 60 year old black female who  is a patient of Dr. Annia Friendly. Hill, who has completed our preoperative  bariatric program and now comes for attempted laparoscopic Roux-En-Y  gastric bypass.   The indications and potential complications of the procedure were  explained to the patient.  Potential complications include, but are not  limited to, bleeding, infection, bile leak, deep venous thrombosis, long-  term nutritional consequences and the possibility of open surgery.   DESCRIPTION OF PROCEDURE:  Patient was taken to the operating room where  she underwent a general endotracheal anesthetic supervised by Jill Side, M.D.  She had an NG tube in place, Foley catheter in place,  PAS stockings in place, was given 2 g of cefoxitin before the surgery.   Her abdomen was prepped with Betadine solution and sterilely draped and  a time-out was held to identify the patient and procedure.   I started with a left upper quadrant trocar in order to access using the  Optiview.  This was an Community education officer.  I then placed six additional  trocars, a 5 mm subxiphoid for the liver retractor, a 5 mm  left lateral  trocar, a 12 mm left paramedian trocar, a 12 mm right paramedian trocar,  a 12 mm right subcostal trocar and then a 10-11 trocar to the left of  the umbilicus for the camera.   First portion of the operation was exploration.  Both lobes of the liver  were unremarkable.  The stomach was unremarkable.  The bowel was covered  with omentum.   First thing identified was the ligament of Treitz which I found.  I  counted 40 cm beyond the ligament of Treitz and divided the small-bowel  using a white load of the Ethicon Endo GIA 45 stapler.   I then placed a Penrose drain on the future gastric limb and then  counted 120 cm and did a side-to-side jejunojejunal anastomosis with  another white load of the 45 Ethicon stapler.  I closed the neurotomy  with two running 2-0 Vicryl sutures.  I then closed the mesentery with a  running 2-0 Vicryl suture.   I then placed a seal over the jejunostomy and then turned my attention  towards  her upper abdomen.   The liver retractor was placed into the left lobe of the liver.  She  actually had a fairly large left lobe of the liver which went way over  to the left side, but I found the esophagogastric junction.  I created a  hole along the angle of His and along the lesser sac and counted about 4-  5 cm below the esophagogastric junction and got into the retrogastric  space and lesser sac.   The first load firing was the blue load of the Ethicon Endo GIA 45  stapler and then I used two loads of the 60 for the gold load for the  stomach and then a final 45 with less than a centimeter of tissue  remaining.  During this time, I passed an Ewald down and visualized the  Ewald in the gastric pouch.  I thought I created a fairly tubular  structure of about 3 cm of width and 5 cm in length.  I then brought the  jejunal limb anticolic  antegastric.  I did divide the omentum prior to  doing this and brought this anticolic antegastric with the Roux  limb  facing toward the left.   I then put a back row of a running 2-0 Vicryl suture.  I did a stapled  side-to-side gastrojejunostomy.  Actually I did a second firing because  at first I was not comfortable I had enough of an opening, but thought I  created a 2-2.5 cm opening.  I then closed the enterotomy with a running  2-0 Vicryl suture.  I actually had to put two buttress sutures in for  that anastomosis.   I then did an anterior layer of 2-0 Vicryl suture with laparoties on  both ends.   I then attempted to close the Peterson's defect.  I did get a bleeder at  the very base of the mesentery which I oversewed but put kind of a  figure-of-eight stitch between the mesentery of the gastric limb and the  transverse colon mesentery.   At this point, Dr. Johna Sheriff broke scrub to pass an endoscope down her  esophagus into the stomach.  The gastrojejunal anastomosis was  visualized at 43 cm.  The esophagogastric junction was visualized at 38  cm for a 5 cm pouch which looked tubular.  There was no bleeding.  The  anastomosis was patent.  He insufflated air while I clamped off the  small-bowel and I flowed the upper abdomen with saline and there was no  leak.  He will dictate this portion of the operation.   He then removed the endoscope.  I then placed Tisseel over the  gastrojejunal anastomosis and the stump of the jejunum and this time I  thought everything layed fine without any tension.  There was no  evidence of any compromised bowel.  I then removed the trocars in turn.  I did bang up a segment of the left lobe of the liver where I had my  trocars entering.  Both liver edges looked good with no bleeding at the  end of the case.   I did irrigate the abdomen out with about a liter of saline.  Again I  use Tisseel over the gastrojejunostomy.  Sponge, needle and instrument  counts were correct at the end of the case.  Trocars were removed.  I  then stapled the skin wounds.  Sterile  dressing with 4x4s.  Patient was  transported to recovery room in  good condition.      Sandria Bales. Ezzard Standing, M.D.  Electronically Signed     DHN/MEDQ  D:  11/26/2006  T:  11/26/2006  Job:  [id number]   cc:   Annia Friendly. Loleta Chance, MD  Fax: [phone]

## 2010-08-05 NOTE — H&P (Signed)
Denise [name], Denise [name]                 ACCOUNT NO.:  [id number]   MEDICAL RECORD NO.:  [id number]          PATIENT TYPE:  AMB   LOCATION:  DAY                           FACILITY:  [facility]   PHYSICIAN:  Jerolyn Shin C. Katrinka Blazing, M.D.   DATE OF BIRTH:  10-14-1950   DATE OF ADMISSION:  DATE OF DISCHARGE:  LH                                HISTORY & PHYSICAL   A 60 year old female with a history of rectal bleeding.  She relates to a  fall, but she specifically states that she did not injure her abdomen.  There is no family history of colon cancer.  She denies abdominal pain, but  she has had 2-3 diarrheal stools per day.  She is, therefore, scheduled for  a colonoscopy.   PAST HISTORY:  1.  Diabetes mellitus.  2.  Hypertension.  3.  Obesity.   MEDICATIONS:  1.  Glucotrol XL 5 mg daily.  2.  Metformin 500 mg, two daily.  3.  Potassium 10 mEq, four daily.  4.  Atenolol 50/25, one daily.   SURGERY:  Cholecystectomy.   PHYSICAL EXAMINATION:  VITAL SIGNS:  Blood pressure 132/78, pulse 74,  respirations 20, weight 300 pounds.  HEENT:  Unremarkable.  NECK:  Supple without JVD, bruit, adenopathy, or thyromegaly.  CHEST:  Clear to auscultation.  HEART:  Regular rate and rhythm without murmur, gallop, or rub.  ABDOMEN:  Soft, nontender.  No masses.  EXTREMITIES:  No cyanosis, clubbing, or edema.  NEUROLOGIC:  No focal motor, sensory, or cerebellar deficit.   IMPRESSION:  1.  History of rectal bleeding.  2.  Hypertension.  3.  Diabetes mellitus.   PLAN:  Screening colonoscopy.      Dirk Dress. Katrinka Blazing, M.D.  Electronically Signed     LCS/MEDQ  D:  11/23/2004  T:  11/23/2004  Job:  [id number]

## 2010-11-03 ENCOUNTER — Encounter: Payer: Self-pay | Admitting: Orthopedic Surgery

## 2010-11-03 ENCOUNTER — Ambulatory Visit (INDEPENDENT_AMBULATORY_CARE_PROVIDER_SITE_OTHER): Payer: PRIVATE HEALTH INSURANCE | Admitting: Orthopedic Surgery

## 2010-11-03 VITALS — Resp 18 | Ht 62.0 in | Wt 264.0 lb

## 2010-11-03 DIAGNOSIS — M1610 Unilateral primary osteoarthritis, unspecified hip: Secondary | ICD-10-CM | POA: Insufficient documentation

## 2010-11-03 DIAGNOSIS — M5410 Radiculopathy, site unspecified: Secondary | ICD-10-CM

## 2010-11-03 DIAGNOSIS — IMO0002 Reserved for concepts with insufficient information to code with codable children: Secondary | ICD-10-CM

## 2010-11-03 MED ORDER — ACETAMINOPHEN-CODEINE 300-30 MG PO TABS: 1.0000 | ORAL_TABLET | Freq: Four times a day (QID) | ORAL | Status: DC | PRN

## 2010-11-03 MED ORDER — GABAPENTIN 100 MG PO CAPS: 100.0000 mg | ORAL_CAPSULE | Freq: Three times a day (TID) | ORAL | Status: DC

## 2010-11-03 MED ORDER — PREDNISONE 10 MG PO KIT: 10.0000 mg | PACK | ORAL | Status: DC

## 2010-11-03 NOTE — Progress Notes (Signed)
Chief complaint: Pain RIGHT hip HPI:(80) 60 years old complains of pain in the RIGHT hip for the last 2-3 months.  The pain started gradually and was not associated with injury.  She has been taking some over-the-counter [facility] pain reliever the 12 our version for the last 3 weeks.  She describes her pain as sharp, 10 in intensity, intermittent in terms of timing and worse when she gets up from a chair better when sitting still.  She denies numbness tingling locking or catching but says the pain radiates from her back across her hip and down into her knee.  She denies groin pain or anterior thigh pain  ROS:(2) She reports frequency joint pain excessive urination denies numbness tingling or unsteady gait or chest pain  PFSH: (1) Hypertension, gallbladder surgery and gastric bypass  Physical Exam(12) GENERAL: normal development   CDV: pulses are normal   Skin: normal  Lymph: nodes were not palpable/normal  Psychiatric: awake, alert and oriented  Neuro: normal sensation  MSK Ambulation is normal standing posture shows increased lordosis and the lumbar spine 1 There is tenderness in the RIGHT side of the lower back no tenderness in the midline.  She is a negative straight leg raising normal reflexes 2 RIGHT hip evaluation reveals no tenderness to palpation over the greater trochanter or in the groin.  Hip flexion is normal but internal rotation causes pain.  Muscle tone is normal and the hip is stable 3 LEFT hip exam normal flexion extension no pain or tenderness normal strength no atrophy and no instability  Imaging: AP pelvis was done which showed joint space narrowing of the RIGHT hip. Imaging 3 views of lumbar spine showed degenerative disc disease of the lumbar spine  Assessment: Primarily she has pains or radicular back pain.  She has no symptomatic arthritis of the hip but joint space narrowing on x-ray    Plan: Recommend standard treatment protocol #1 physical therapy of the lumbar  spine #2 Sterapred Dosepak 12 days #3 Neurontin to control neurologic symptoms #4 codeine for pain  Separate identifiable x-ray report AP pelvis Reason for x-ray hip pain  Findings: There is a noticeable joint space narrowing of the RIGHT hip compared to the LEFT  Impression mild to moderate osteoarthritis RIGHT hip  Separate x-ray report 3 views lumbar spine Reason for x-ray "Hip pain" This is actually back pain  X-rays show degenerative disc disease lumbar spine reasonable sagittal alignment slightly abnormal coronal alignment.  This space narrowing is seen in facet joint arthritis is seen  Impression degenerative disc disease lumbar spine

## 2010-11-08 ENCOUNTER — Telehealth: Payer: Self-pay | Admitting: Orthopedic Surgery

## 2010-11-08 NOTE — Telephone Encounter (Signed)
Error

## 2010-12-15 ENCOUNTER — Encounter: Payer: Self-pay | Admitting: Orthopedic Surgery

## 2010-12-15 ENCOUNTER — Ambulatory Visit (INDEPENDENT_AMBULATORY_CARE_PROVIDER_SITE_OTHER): Payer: PRIVATE HEALTH INSURANCE | Admitting: Orthopedic Surgery

## 2010-12-15 VITALS — Ht 62.0 in | Wt 264.0 lb

## 2010-12-15 DIAGNOSIS — M549 Dorsalgia, unspecified: Secondary | ICD-10-CM

## 2010-12-15 MED ORDER — PREDNISONE 10 MG PO TABS: 10.0000 mg | ORAL_TABLET | Freq: Every day | ORAL | Status: AC

## 2010-12-15 MED ORDER — PREDNISONE 10 MG PO TABS: 10.0000 mg | ORAL_TABLET | Freq: Every day | ORAL | Status: DC

## 2010-12-15 NOTE — Progress Notes (Signed)
Back pain and RIGHT hip pain.  60 years old. Recently completed physical therapy for back pain. He also gave her some steroids, and some Tylenol with codeine. She completed her physical therapy, but has not improved. The Neurontin was also given and she still having catching when she stands from seated position pain radiating down the leg. She also has some anterior thigh pain, which we believed to be secondary to her hip arthritis.  Agreed to go ahead and stop the physical therapy at this time and sent her for MRI of the back look for herniated disc.  Review of systems no bowel or bladder dysfunction. No fever, chills, or night sweats.  Clinical exam, unchanged.  She will continue prednisone 10 mg twice a day as this seemed to help the most. She will get an intramuscular shot of Depo-Medrol 40 mg. She will return after her MRI [facility] hospital with a disc showing the films  I am cortisone shot, RIGHT hip.  Timeout was completed.  Sterile technique was given.  I am shot 40 mg Depo-Medrol given muscular.  No complications

## 2010-12-15 NOTE — Patient Instructions (Signed)
MRI AT [facility]

## 2010-12-19 ENCOUNTER — Telehealth: Payer: Self-pay | Admitting: *Deleted

## 2010-12-19 NOTE — Telephone Encounter (Signed)
No precert needed for MRI per Marylu Lund with [employer], MRI [place] on 12/21/10 at 915am to bring films and report to fu appt with [place] on 12/29/10 at 4pm.

## 2010-12-29 ENCOUNTER — Ambulatory Visit (INDEPENDENT_AMBULATORY_CARE_PROVIDER_SITE_OTHER): Payer: PRIVATE HEALTH INSURANCE | Admitting: Orthopedic Surgery

## 2010-12-29 ENCOUNTER — Encounter: Payer: Self-pay | Admitting: Orthopedic Surgery

## 2010-12-29 VITALS — BP 130/80 | Ht 64.0 in | Wt 264.0 lb

## 2010-12-29 DIAGNOSIS — IMO0002 Reserved for concepts with insufficient information to code with codable children: Secondary | ICD-10-CM

## 2010-12-29 DIAGNOSIS — M5410 Radiculopathy, site unspecified: Secondary | ICD-10-CM

## 2010-12-29 MED ORDER — GABAPENTIN 100 MG PO CAPS: 100.0000 mg | ORAL_CAPSULE | Freq: Three times a day (TID) | ORAL | Status: DC

## 2010-12-29 MED ORDER — PREDNISONE 10 MG PO TABS: 10.0000 mg | ORAL_TABLET | Freq: Two times a day (BID) | ORAL | Status: AC

## 2010-12-29 NOTE — Patient Instructions (Signed)
Epidural injection series x 3   Dr Nickola Major referral

## 2010-12-29 NOTE — Progress Notes (Signed)
MRI followup.  MRI review with report.  Films reviewed.  Continued pain, RIGHT hip.  The MRI shows lumbar spondylosis and degenerative disc disease causing mild LEFT and borderline RIGHT foraminal stenosis at L4-L5. There are bilateral facet joint effusions at L4 and 5.  Recommend epidural injections continue prednisone 10 mg twice a day and Neurontin 100 mg 3 times a day

## 2010-12-30 LAB — DIFFERENTIAL
Basophils Absolute: 0
Basophils Absolute: 0
Basophils Absolute: 0
Basophils Relative: 0
Basophils Relative: 0
Basophils Relative: 1
Eosinophils Absolute: 0
Eosinophils Absolute: 0.1
Eosinophils Absolute: 0.1
Eosinophils Relative: 0
Eosinophils Relative: 1
Eosinophils Relative: 1
Lymphocytes Relative: 15
Lymphocytes Relative: 18
Lymphocytes Relative: 36
Lymphs Abs: 1.3
Lymphs Abs: 1.3
Lymphs Abs: 2.2
Monocytes Absolute: 0.4
Monocytes Absolute: 0.4
Monocytes Absolute: 0.5
Monocytes Relative: 5
Monocytes Relative: 6
Monocytes Relative: 7
Neutro Abs: 3.3
Neutro Abs: 5.1
Neutro Abs: 6.8
Neutrophils Relative %: 55
Neutrophils Relative %: 75
Neutrophils Relative %: 79 — ABNORMAL HIGH

## 2010-12-30 LAB — CBC
HCT: 32.1 — ABNORMAL LOW
HCT: 32.6 — ABNORMAL LOW
HCT: 35.5 — ABNORMAL LOW
Hemoglobin: 10.7 — ABNORMAL LOW
Hemoglobin: 10.7 — ABNORMAL LOW
Hemoglobin: 11.6 — ABNORMAL LOW
MCHC: 32.6
MCHC: 32.8
MCHC: 33.2
MCV: 77.5 — ABNORMAL LOW
MCV: 77.9 — ABNORMAL LOW
MCV: 78.1
Platelets: 323
Platelets: 363
Platelets: 407 — ABNORMAL HIGH
RBC: 4.14
RBC: 4.18
RBC: 4.56
RDW: 16.4 — ABNORMAL HIGH
RDW: 16.4 — ABNORMAL HIGH
RDW: 16.6 — ABNORMAL HIGH
WBC: 6
WBC: 6.8
WBC: 8.7

## 2010-12-30 LAB — URINALYSIS, ROUTINE W REFLEX MICROSCOPIC
Bilirubin Urine: NEGATIVE
Glucose, UA: NEGATIVE
Hgb urine dipstick: NEGATIVE
Ketones, ur: NEGATIVE
Nitrite: NEGATIVE
Protein, ur: NEGATIVE
Specific Gravity, Urine: 1.021
Urobilinogen, UA: 0.2
pH: 5.5

## 2010-12-30 LAB — COMPREHENSIVE METABOLIC PANEL
ALT: 26
AST: 23
Albumin: 3.9
Alkaline Phosphatase: 59
BUN: 8
CO2: 30
Calcium: 9.5
Chloride: 105
Creatinine, Ser: 0.82
GFR calc Af Amer: >60
GFR calc non Af Amer: >60
Glucose, Bld: 104 — ABNORMAL HIGH
Potassium: 3.2 — ABNORMAL LOW
Sodium: 145
Total Bilirubin: 0.6
Total Protein: 6.8

## 2010-12-30 LAB — HEMOGLOBIN AND HEMATOCRIT, BLOOD
HCT: 34.2 — ABNORMAL LOW
HCT: 35.8 — ABNORMAL LOW
Hemoglobin: 11.3 — ABNORMAL LOW
Hemoglobin: 11.7 — ABNORMAL LOW

## 2010-12-30 LAB — PREGNANCY, URINE: Preg Test, Ur: NEGATIVE

## 2011-01-09 ENCOUNTER — Telehealth: Payer: Self-pay | Admitting: Radiology

## 2011-01-09 NOTE — Telephone Encounter (Signed)
I faxed a referral for this patient to Dr. Bethea for ESI injections at level L4-5. 

## 2011-01-10 ENCOUNTER — Telehealth: Payer: Self-pay | Admitting: Orthopedic Surgery

## 2011-01-10 NOTE — Telephone Encounter (Signed)
Patient called to relay that she was contacted by & went to Dr. Ozzie Hoyle office yesterday, 01/09/11, for her referral appointment.  She states that their office did an insurance check, and that their office is considered out of network, therefore she would have a higher copay and deductible amount to pay.  She therefore cancelled the appointment.   She works for [employer] and is asking about referral to Dr. Channing Mutters.  Can she self-refer there?  Her work phone # is [phone].

## 2011-01-10 NOTE — Telephone Encounter (Signed)
Patient called back and would like to be referred to Dr. Laurian Brim in [place]? I have never heard of him. She says that he is pain clinic or a neurosurgeon. Advise

## 2011-01-11 NOTE — Telephone Encounter (Signed)
Ok   She can be referred to whomever she wishes

## 2011-01-16 ENCOUNTER — Telehealth: Payer: Self-pay | Admitting: Radiology

## 2011-01-16 NOTE — Telephone Encounter (Signed)
I faxed a referral for the patient to Dr. Laurian Brim per request by the patient to be seen for her back.

## 2011-01-17 ENCOUNTER — Encounter: Payer: Self-pay | Admitting: Orthopedic Surgery

## 2011-01-20 ENCOUNTER — Ambulatory Visit: Payer: PRIVATE HEALTH INSURANCE | Admitting: Urgent Care

## 2011-01-20 ENCOUNTER — Telehealth: Payer: Self-pay | Admitting: Urgent Care

## 2011-01-20 NOTE — Telephone Encounter (Signed)
Pt was a no show

## 2011-01-20 NOTE — Telephone Encounter (Signed)
Please reschedule

## 2011-01-24 NOTE — Telephone Encounter (Signed)
LMOM for patient to call and [facility] her OV

## 2011-03-24 ENCOUNTER — Other Ambulatory Visit: Payer: Self-pay | Admitting: Orthopedic Surgery

## 2011-03-28 ENCOUNTER — Encounter: Payer: Self-pay | Admitting: Gynecologic Oncology

## 2011-03-29 ENCOUNTER — Ambulatory Visit: Payer: PRIVATE HEALTH INSURANCE | Attending: Gynecologic Oncology | Admitting: Gynecologic Oncology

## 2011-03-29 ENCOUNTER — Encounter: Payer: Self-pay | Admitting: Gynecologic Oncology

## 2011-03-29 VITALS — BP 140/84 | HR 68 | Temp 97.9°F | Resp 20 | Ht 62.32 in | Wt 260.2 lb

## 2011-03-29 DIAGNOSIS — R1900 Intra-abdominal and pelvic swelling, mass and lump, unspecified site: Secondary | ICD-10-CM | POA: Insufficient documentation

## 2011-03-29 DIAGNOSIS — Z79899 Other long term (current) drug therapy: Secondary | ICD-10-CM | POA: Insufficient documentation

## 2011-03-29 DIAGNOSIS — N839 Noninflammatory disorder of ovary, fallopian tube and broad ligament, unspecified: Secondary | ICD-10-CM | POA: Insufficient documentation

## 2011-03-29 DIAGNOSIS — I10 Essential (primary) hypertension: Secondary | ICD-10-CM | POA: Insufficient documentation

## 2011-03-29 DIAGNOSIS — Z9884 Bariatric surgery status: Secondary | ICD-10-CM | POA: Insufficient documentation

## 2011-03-29 DIAGNOSIS — Z803 Family history of malignant neoplasm of breast: Secondary | ICD-10-CM | POA: Insufficient documentation

## 2011-03-29 DIAGNOSIS — Z7982 Long term (current) use of aspirin: Secondary | ICD-10-CM | POA: Insufficient documentation

## 2011-03-29 NOTE — Progress Notes (Signed)
Consult Note: Gyn-Onc  Consult was requested by Dr. Galloway for the evaluation of Denise [name] 60 y.o. female  CC:  Chief Complaint  Patient presents with  . Ovarian Mass    New pt    HPI:  Denise [name] is a 60-year-old nulliparous female with low back pain since early 2011. An MRI of the lumbar spine was obtained and showed an abnormality in the area of the adnexa.  A transvaginal ultrasound obtained on 01/19/2011 showed a right ovary measuring 4.6 x 3.4 3.1 cm with a small complex lesion measuring 1.6 x 1.4 x 1.1 cm. Left ovary measures 4.7 x 3.2 x 3.3 cm and complete a complex cyst the cyst is unilocular and contain a mural nodule measuring 1.2 x 1.2 x 1.6 cm the mural nodule had both solid and cystic components. No color flow is identified within the nodule there is a trace amount of simple free fluid in the cul-de-sac.   An MRI of the pelvis was completed on 01/29/2011. It demonstrated a postmenopausal uterus measuring 6.5 x 3.1 x 3.7 cm. A 4.9 x 4.1 x 3.6 cm right lateral subserosal fibroids appreciated. Endometrial stripe measured 5 mm. Left ovary contained a complex cystic lesion measuring 3.0 x 2.8 x 2.4 cm. This lesion showed a few thin internal septations and a small less than 1 cm mural nodular density. The right ovary contained a 1.5 cm cyst consistent with endometrioma. There is no evidence of pelvic lymphadenopathy or free fluid sigmoid diverticulosis was identified  Interval History: Denise [name] denies any pelvic pain weight loss or satiety change in appetite alternating diarrhea constipation or bleeding of the rectum bladder or vagina.  Review of Systems:  Constitutional  Feels well,  Skin/Breast  No rash, sores, jaundice, itching, dryness Cardiovascular  No chest pain, shortness of breath, or edema  Pulmonary  No cough or wheeze.  Gastro Intestinal  No nausea, vomitting, or diarrhoea. No bright red blood per rectum, no abdominal pain, change in bowel movement, or  constipation. No bloating no early satiety or change in appetite. Genito Urinary  No frequency, urgency, dysuria, no vaginal bleeding vaginal discharge. Musculo Skeletal  No myalgia, arthralgia, joint swelling or pain  Neurologic  No weakness, numbness, change in gait,  Psychology  No depression, anxiety, insomnia.    Current Meds:  Outpatient Encounter Prescriptions as of 03/29/2011  Medication Sig Dispense Refill  . amLODipine (NORVASC) 5 MG tablet Take 5 mg by mouth daily.      . aspirin 81 MG tablet Take 81 mg by mouth daily.        . calcium carbonate (OS-CAL - DOSED IN MG OF ELEMENTAL CALCIUM) 1250 MG tablet Take 1 tablet by mouth daily.        . Calcium Carbonate Antacid (TUMS PO) Take by mouth.        . Ferrous Gluconate (IRON) 240 (27 FE) MG TABS Take by mouth daily.       . gabapentin (NEURONTIN) 100 MG capsule TAKE 1 CAPSULE 3 TIMES A DAY  90 capsule  2  . Multiple Vitamin (MULTIVITAMIN) capsule Take 1 capsule by mouth daily.        . traMADol (ULTRAM) 50 MG tablet Take 50 mg by mouth every 6 (six) hours as needed. Taking twice a day      . vitamin B-12 (CYANOCOBALAMIN) 250 MCG tablet Take 250 mcg by mouth daily.        . Acetaminophen-Codeine 300-30 MG per tablet Take 1-2   tablets by mouth every 6 (six) hours as needed for pain.  40 tablet  1   Prednisone 10 mg twice daily for the past day and a half Allergy: No Known Allergies  Social Hx:   History   Social History  . Marital Status: Single    Spouse Name: N/A    Number of Children: N/A  . Years of Education: 12th grade   Occupational History  . unemployed    Social History Main Topics  . Smoking status: Former Smoker    Types: Cigarettes    Quit date: 03/20/1976  . Smokeless tobacco: Not on file  . Alcohol Use: No  . Drug Use: Not on file  . Sexually Active: Not on file   Other Topics Concern  . Not on file   Social History Narrative  . No narrative on file    Past Surgical Hx:  Past Surgical  History  Procedure Date  . Gallbladder surgery   . Gastric bypass     Past Medical Hx:  Past Medical History  Diagnosis Date  . HTN (hypertension)   . Anemia   . HTN (hypertension)     Past Gynecological History: Gravida 0 menarche in early teenage years with regular menses last Pap test November 2012 within normal limits. Denies any history of abnormal Pap tests are abnormal uterine bleeding  Family Hx:  Family History  Problem Relation Age of Onset  . Arthritis    . Cancer    . Breast cancer Mother   . Hypertension Father   . Diabetes Father     Vitals:  Blood pressure 140/84, pulse 68, temperature 97.9 F (36.6 C), resp. rate 20, height 5' 2.32" (1.583 m), weight 260 lb 3.2 oz (118.026 kg).  Physical Exam: WD in NAD Neck  Supple NROM, without any enlargements.  Lymph Node Survey No cervical supraclavicular or inguinal adenopathy Cardiovascular  Pulse normal rate, regularity and rhythm. S1 and S2 normal.  Lungs  Clear to auscultation bilateraly, without wheezes/crackles/rhonchi. Good air movement.  Skin  No rash/lesions/breakdown  Psychiatry  Alert and oriented to person, place, and time  Abdomen  Normoactive bowel sounds, abdomen soft, non-tender and obese. Surgical  sites intact without evidence of hernia. No palpable fluid wave or omental cake no evidence of hepatosplenomegaly Back No CVA tenderness Genito Urinary  Vulva/vagina: Normal external female genitalia.  No lesions. No discharge or bleeding.  Bladder/urethra:  No lesions or masses  Vagina: No evidence of prolapse atrophic.  Narrow vagina  Cervix: Normal appearing, no lesions. Ultimately 1.5 cm  Uterus: Small, mobile, no parametrial involvement or nodularity. Right broad ligament fibroid palpable at the inferior aspect. The uterus is very well supported.  Adnexa: No palpable masses. Rectal  Good tone, no masses no cul de sac nodularity.  Extremities  No bilateral cyanosis, clubbing or  edema.   Assessment/Plan:  Denise [name]  is a 60 y.o.  year old with a complex left-sided adnexal mass with a mural nodule. There is a right-sided mass consistent with endometrioma. CA 125 is within normal limits the value of 9.4. Recommendations was given to the patient for bilateral salpingo-oophorectomy, with the inclusion of a hysterectomy if that were her preference. Should Denise [name] is aware that if the frozen section diagnosis consistent with a malignancy the standard of care would include hysterectomy omentectomy and possible lymph node dissection. The goal is to attempt a minimally invasive approach given her morbid obesity.  The patient at this time   elects for minimally invasive bilateral salpingo-oophorectomy. This likely will be completed robotic assistance.  The patient is aware that they're always exists the possibility of not being able to complete the procedure in a minimally invasive approach and that laparotomy may be required for safety. In the event of malignancy is identified at that time it is likely that the procedure will be completed via a laparotomy.  Risks and benefits of the procedure discussed with the patient her mother and her niece were that of infection bleeding damage to surrounding structures prolonged hospitalization or reoperation.  It was noted that she has been on prednisone for a day and a half. I've asked that she discontinue this until after surgery.  The plan is to complete the surgery on Tuesday, 04/04/2011     Marialy Urbanczyk, MD, PhD 03/29/2011, 12:09 PM    

## 2011-03-29 NOTE — Patient Instructions (Signed)
Preoperative evaluation as counseled. Robotic assisted laparoscopic Bilateral salpingo-oophorectomy with possible laparotomy and staging to be performed on 04/04/2011. Discontinue use of prednisone

## 2011-03-31 ENCOUNTER — Other Ambulatory Visit: Payer: Self-pay | Admitting: *Deleted

## 2011-03-31 ENCOUNTER — Ambulatory Visit (HOSPITAL_COMMUNITY)
Admission: RE | Admit: 2011-03-31 | Discharge: 2011-03-31 | Disposition: A | Discharge status: Home / Self Care | Payer: PRIVATE HEALTH INSURANCE | Source: Ambulatory Visit | Attending: Obstetrics & Gynecology | Admitting: Obstetrics & Gynecology

## 2011-03-31 ENCOUNTER — Encounter (HOSPITAL_COMMUNITY)
Admission: RE | Admit: 2011-03-31 | Discharge: 2011-03-31 | Disposition: A | Discharge status: Home / Self Care | Payer: PRIVATE HEALTH INSURANCE | Source: Ambulatory Visit | Attending: Obstetrics & Gynecology | Admitting: Obstetrics & Gynecology

## 2011-03-31 ENCOUNTER — Encounter (HOSPITAL_COMMUNITY): Payer: Self-pay

## 2011-03-31 ENCOUNTER — Other Ambulatory Visit: Payer: Self-pay

## 2011-03-31 DIAGNOSIS — Z01810 Encounter for preprocedural cardiovascular examination: Secondary | ICD-10-CM | POA: Insufficient documentation

## 2011-03-31 DIAGNOSIS — Z87891 Personal history of nicotine dependence: Secondary | ICD-10-CM | POA: Insufficient documentation

## 2011-03-31 DIAGNOSIS — Z01818 Encounter for other preprocedural examination: Secondary | ICD-10-CM | POA: Insufficient documentation

## 2011-03-31 DIAGNOSIS — Z01812 Encounter for preprocedural laboratory examination: Secondary | ICD-10-CM | POA: Insufficient documentation

## 2011-03-31 DIAGNOSIS — I491 Atrial premature depolarization: Secondary | ICD-10-CM | POA: Insufficient documentation

## 2011-03-31 HISTORY — DX: Unspecified osteoarthritis, unspecified site: M19.90

## 2011-03-31 LAB — CBC
HCT: 38.5 % (ref 36.0–46.0)
Hemoglobin: 12.2 g/dL (ref 12.0–15.0)
MCH: 26.2 pg (ref 26.0–34.0)
MCHC: 31.7 g/dL (ref 30.0–36.0)
MCV: 82.6 fL (ref 78.0–100.0)
Platelets: 394 10*3/uL (ref 150–400)
RBC: 4.66 MIL/uL (ref 3.87–5.11)
RDW: 14.8 % (ref 11.5–15.5)
WBC: 7.5 10*3/uL (ref 4.0–10.5)

## 2011-03-31 LAB — COMPREHENSIVE METABOLIC PANEL
ALT: 15 U/L (ref 0–35)
AST: 12 U/L (ref 0–37)
Albumin: 3.5 g/dL (ref 3.5–5.2)
Alkaline Phosphatase: 89 U/L (ref 39–117)
BUN: 10 mg/dL (ref 6–23)
CO2: 28 mEq/L (ref 19–32)
Calcium: 9.5 mg/dL (ref 8.4–10.5)
Chloride: 105 mEq/L (ref 96–112)
Creatinine, Ser: 0.84 mg/dL (ref 0.50–1.10)
GFR calc Af Amer: 86 mL/min — ABNORMAL LOW (ref >90)
GFR calc non Af Amer: 75 mL/min — ABNORMAL LOW (ref >90)
Glucose, Bld: 95 mg/dL (ref 70–99)
Potassium: 3.7 mEq/L (ref 3.5–5.1)
Sodium: 141 mEq/L (ref 135–145)
Total Bilirubin: 0.2 mg/dL — ABNORMAL LOW (ref 0.3–1.2)
Total Protein: 6.8 g/dL (ref 6.0–8.3)

## 2011-03-31 LAB — DIFFERENTIAL
Basophils Absolute: 0.1 10*3/uL (ref 0.0–0.1)
Basophils Relative: 1 % (ref 0–1)
Eosinophils Absolute: 0.1 10*3/uL (ref 0.0–0.7)
Eosinophils Relative: 2 % (ref 0–5)
Lymphocytes Relative: 30 % (ref 12–46)
Lymphs Abs: 2.3 10*3/uL (ref 0.7–4.0)
Monocytes Absolute: 0.6 10*3/uL (ref 0.1–1.0)
Monocytes Relative: 7 % (ref 3–12)
Neutro Abs: 4.5 10*3/uL (ref 1.7–7.7)
Neutrophils Relative %: 60 % (ref 43–77)

## 2011-03-31 LAB — SURGICAL PCR SCREEN
MRSA, PCR: NEGATIVE
Staphylococcus aureus: NEGATIVE

## 2011-03-31 IMAGING — CR DG CHEST 2V
2 series · 2 of 2 positions shown · non-contrast
Comparison: Chest x-ray [DATE].

CLINICAL DATA: Preoperative study from robotic assisted bilateral
salpingo-oophorectomy.  No chest complaints.  Ex-smoker.

CHEST - 2 VIEW

[w chest pa]
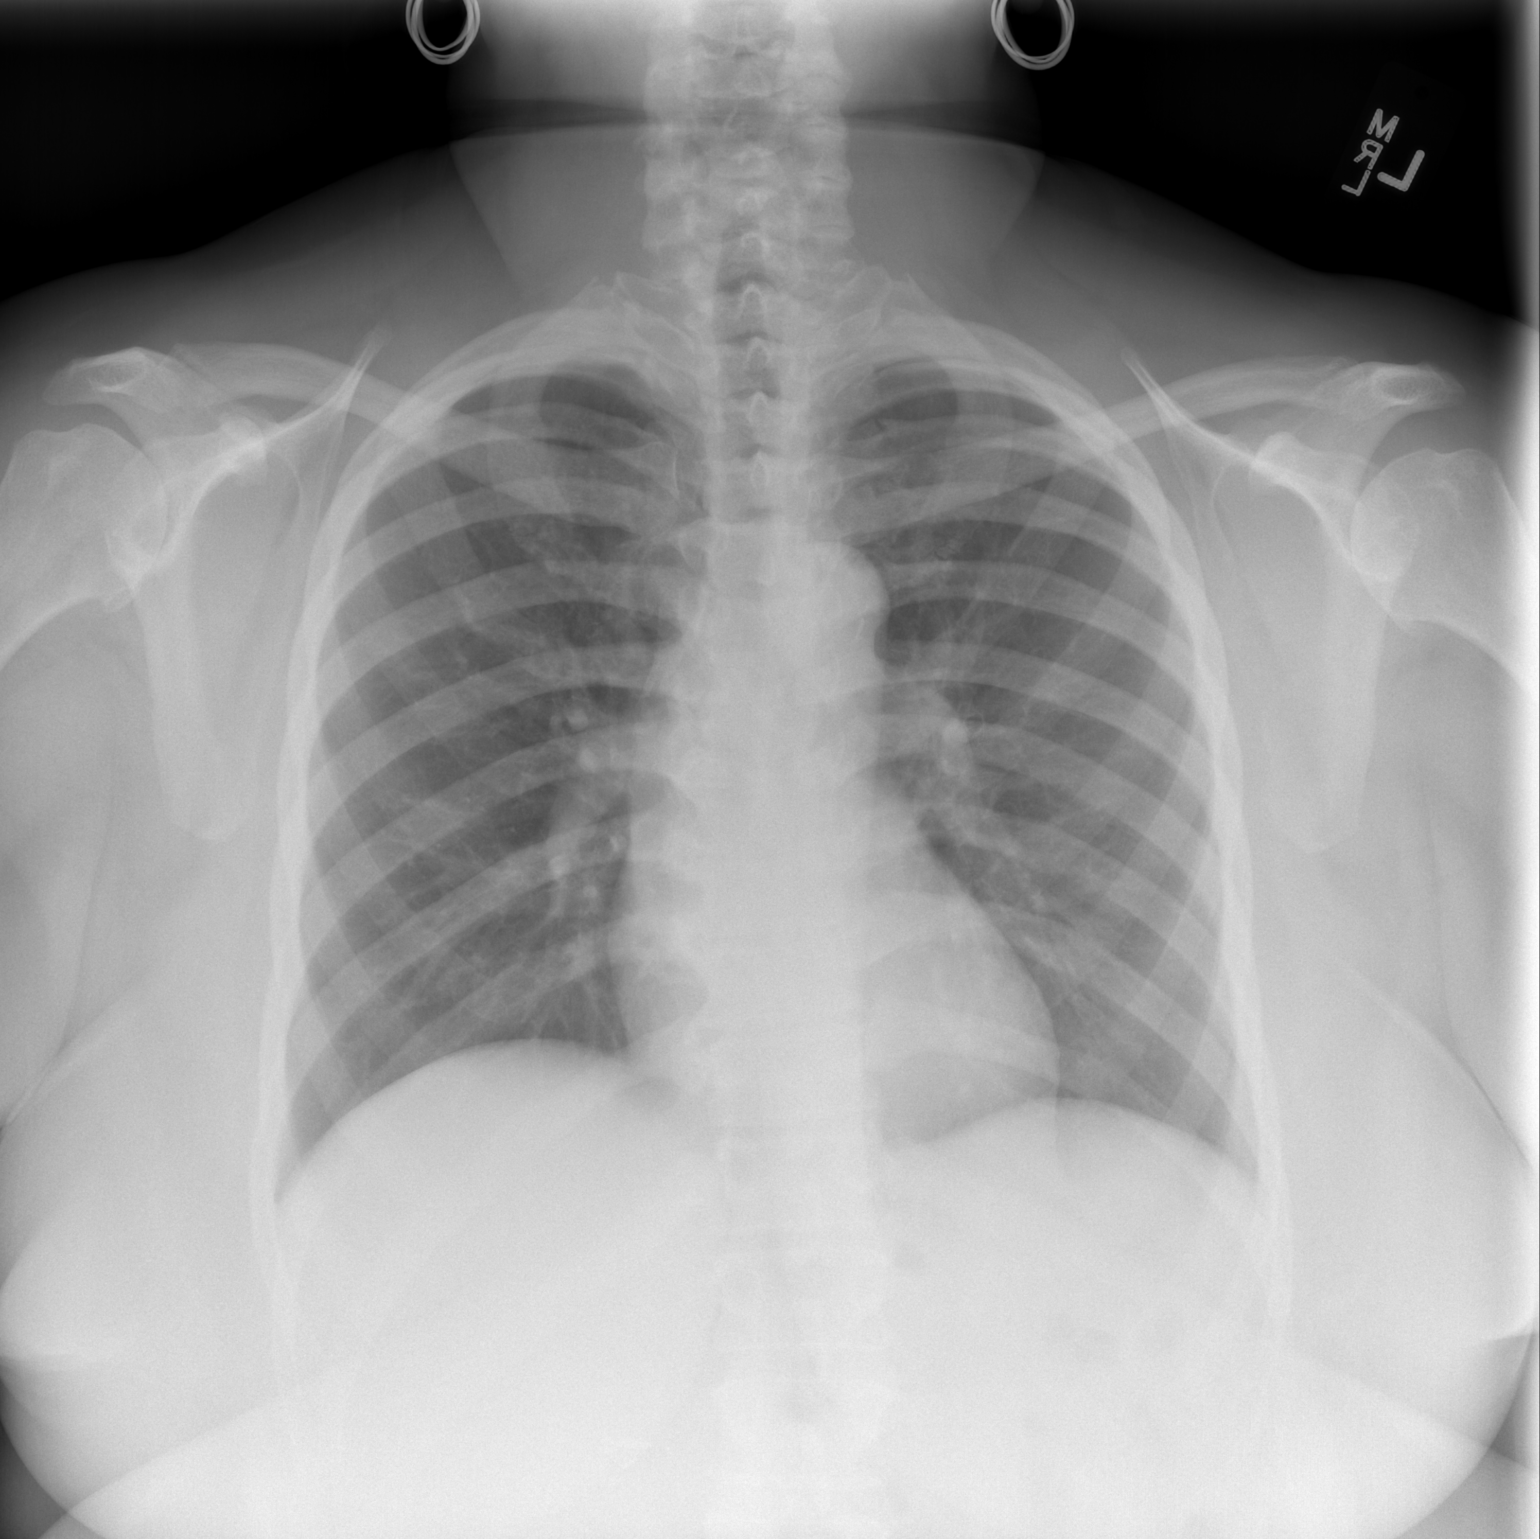

[w chest lat]
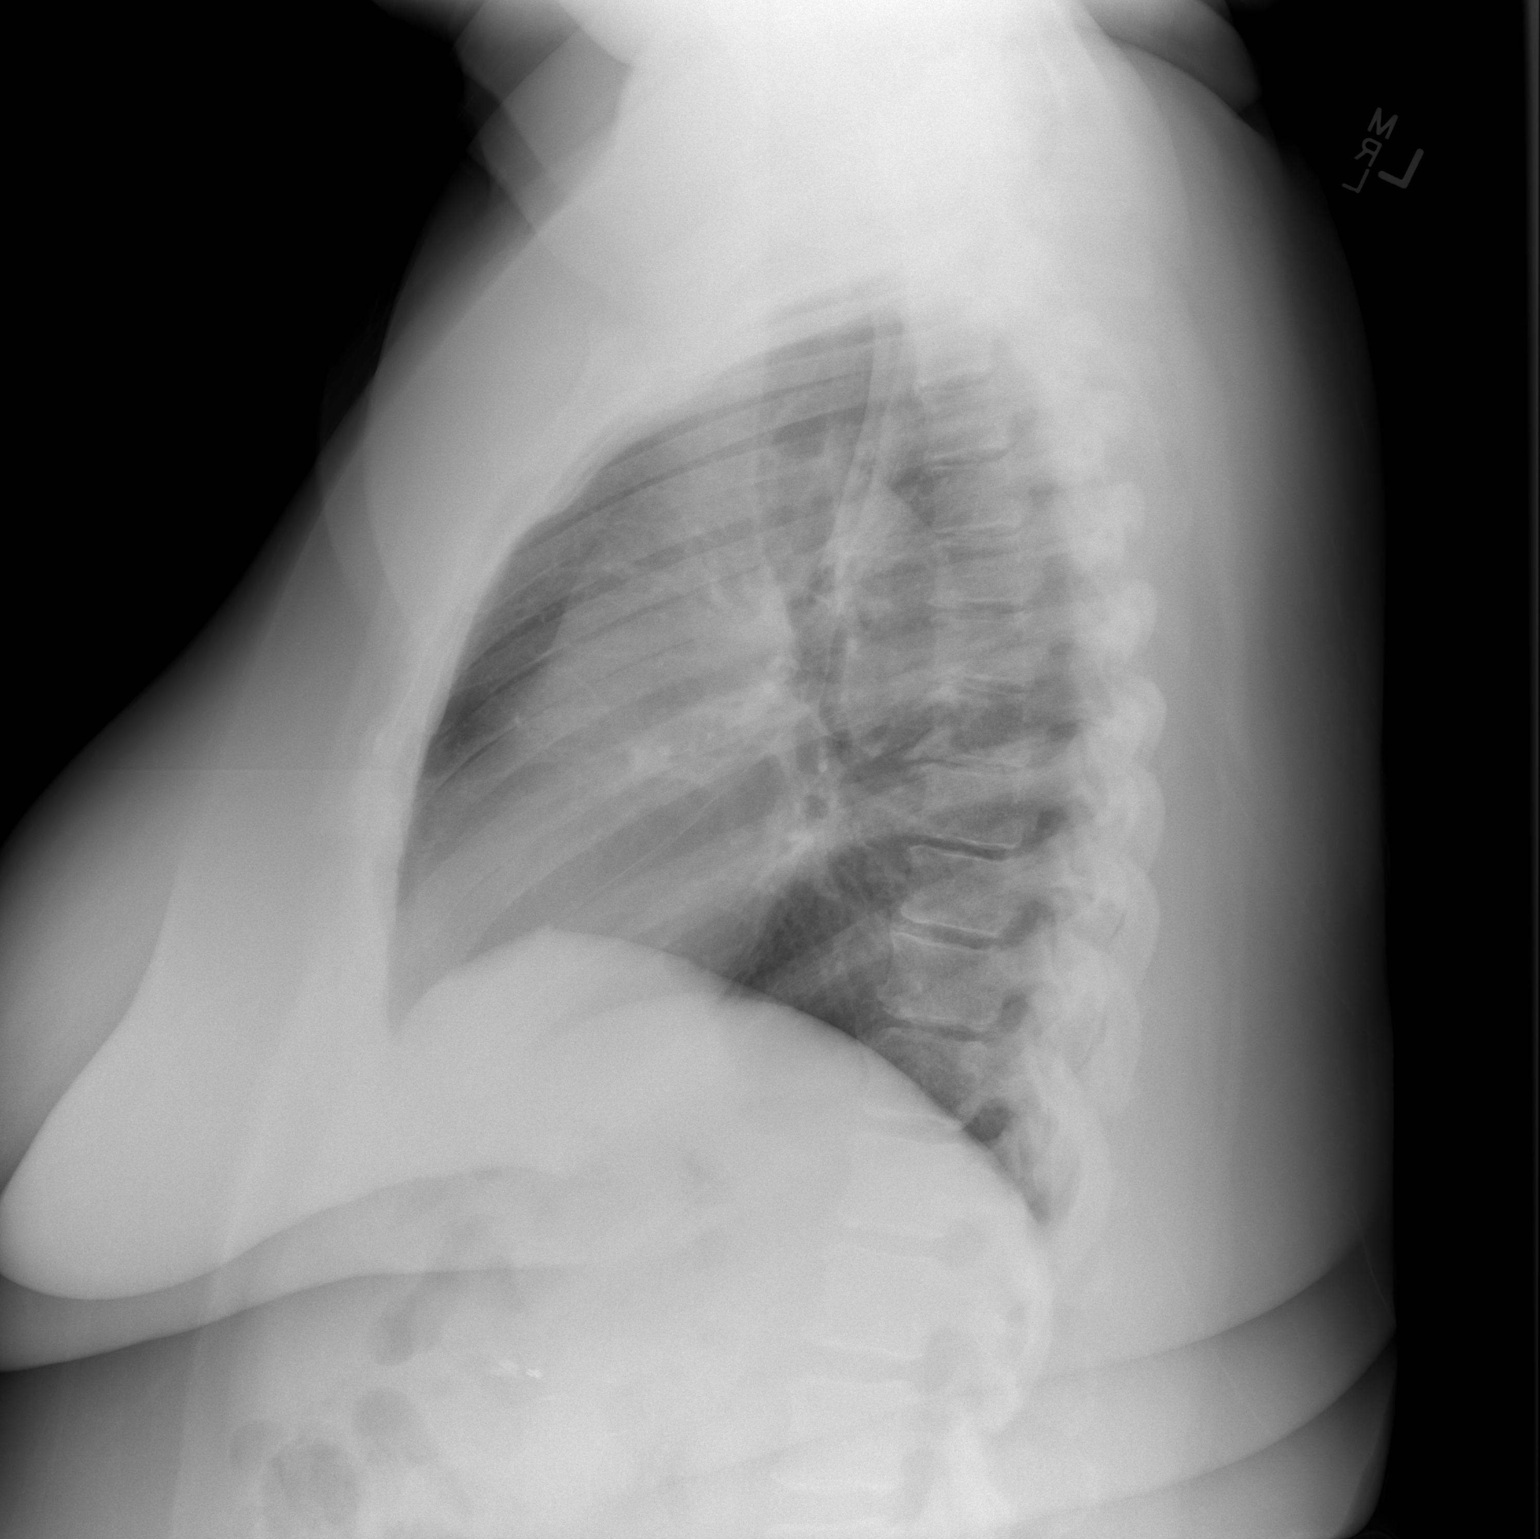

[2 of 2 positions shown; findings below may reference images not displayed]

FINDINGS: Lung volumes are normal.  No consolidative airspace
disease.  No pleural effusions.  Pulmonary vasculature and the
cardiomediastinal silhouette are within normal limits.  Surgical
clips in the right upper quadrant of the abdomen, likely from prior
cholecystectomy.
IMPRESSION: No radiographic evidence of acute cardiopulmonary disease.

## 2011-03-31 NOTE — Patient Instructions (Addendum)
[name]  03/31/2011   Your procedure is scheduled on:  Tues. 04/04/2011  Report to [facility] at 0830 AM.  Call this number if you have problems the morning of surgery: [phone]   Remember:No solid foods after Sunday 04/02/2011!Marland KitchenDRINK CLEAR LIQUIDS ALL DAY ON Monday 04/03/2011 -FOLLOW CLEAR LIQUID DIET!   Do not eat food:After Midnight.  May have clear liquids:until Midnight .  Clear liquids include soda, tea, black coffee, apple or grape juice, broth.  Take these medicines the morning of surgery with A SIP OF WATER: Neurontin, Norvasc, Ultram   Do not wear jewelry, make-up or nail polish.  Do not wear lotions, powders, or perfumes.   Do not shave 48 hours prior to surgery.(women only-shaving legs)  Do not bring valuables to the hospital.  Contacts, dentures or bridgework may not be worn into surgery.  Leave suitcase in the car. After surgery it may be brought to your room.  For patients admitted to the hospital, checkout time is 11:00 AM the day of discharge.   Patients discharged the day of surgery will not be allowed to drive home.  Name and phone number of your driver:   Special Instructions: CHG Shower Use Special Wash: 1/2 bottle night before surgery and 1/2 bottle morning of surgery.   Please read over the following fact sheets that you were given: MRSA Information

## 2011-03-31 NOTE — Pre-Procedure Instructions (Signed)
EKG-03/10/2009 on chart, Adult Echocardiography Report from 03/18/2009 [facility] on chart, and Rest /Gated Stress from 03/18/2009

## 2011-04-04 ENCOUNTER — Ambulatory Visit (HOSPITAL_COMMUNITY)
Admission: RE | Admit: 2011-04-04 | Discharge: 2011-04-04 | Disposition: A | Discharge status: Home / Self Care | Payer: PRIVATE HEALTH INSURANCE | Source: Ambulatory Visit | Attending: Obstetrics & Gynecology | Admitting: Obstetrics & Gynecology

## 2011-04-04 ENCOUNTER — Other Ambulatory Visit: Payer: Self-pay | Admitting: Gynecologic Oncology

## 2011-04-04 ENCOUNTER — Encounter (HOSPITAL_COMMUNITY): Payer: Self-pay | Admitting: Anesthesiology

## 2011-04-04 ENCOUNTER — Encounter (HOSPITAL_COMMUNITY): Payer: Self-pay | Admitting: *Deleted

## 2011-04-04 ENCOUNTER — Ambulatory Visit (HOSPITAL_COMMUNITY): Payer: PRIVATE HEALTH INSURANCE | Admitting: Anesthesiology

## 2011-04-04 ENCOUNTER — Encounter (HOSPITAL_COMMUNITY)
Admission: RE | Disposition: A | Discharge status: Home / Self Care | Payer: Self-pay | Source: Ambulatory Visit | Attending: Obstetrics & Gynecology

## 2011-04-04 DIAGNOSIS — M545 Low back pain, unspecified: Secondary | ICD-10-CM | POA: Insufficient documentation

## 2011-04-04 DIAGNOSIS — N9489 Other specified conditions associated with female genital organs and menstrual cycle: Secondary | ICD-10-CM | POA: Insufficient documentation

## 2011-04-04 DIAGNOSIS — R1900 Intra-abdominal and pelvic swelling, mass and lump, unspecified site: Secondary | ICD-10-CM

## 2011-04-04 DIAGNOSIS — D649 Anemia, unspecified: Secondary | ICD-10-CM | POA: Insufficient documentation

## 2011-04-04 DIAGNOSIS — N83209 Unspecified ovarian cyst, unspecified side: Secondary | ICD-10-CM | POA: Insufficient documentation

## 2011-04-04 DIAGNOSIS — I10 Essential (primary) hypertension: Secondary | ICD-10-CM | POA: Insufficient documentation

## 2011-04-04 DIAGNOSIS — Z79899 Other long term (current) drug therapy: Secondary | ICD-10-CM | POA: Insufficient documentation

## 2011-04-04 LAB — BODY FLUID CULTURE
Culture: NO GROWTH
Gram Stain: NONE SEEN

## 2011-04-04 LAB — GLUCOSE, CAPILLARY: Glucose-Capillary: 102 mg/dL — ABNORMAL HIGH (ref 70–99)

## 2011-04-04 SURGERY — ROBOTIC ASSISTED BILATERAL SALPINGO OOPHORECTOMY
Anesthesia: General | Site: Abdomen | Laterality: Bilateral | Wound class: Clean Contaminated

## 2011-04-04 MED ORDER — BUPIVACAINE HCL (PF) 0.25 % IJ SOLN
INTRAMUSCULAR | Status: DC | PRN
  Administered 2011-04-04: 12 mL

## 2011-04-04 MED ORDER — PROPOFOL 10 MG/ML IV EMUL
INTRAVENOUS | Status: DC | PRN
  Administered 2011-04-04: 40 mg via INTRAVENOUS
  Administered 2011-04-04: 100 mg via INTRAVENOUS
  Administered 2011-04-04: 40 mg via INTRAVENOUS
  Administered 2011-04-04: 160 mg via INTRAVENOUS

## 2011-04-04 MED ORDER — SUCCINYLCHOLINE CHLORIDE 20 MG/ML IJ SOLN
INTRAMUSCULAR | Status: DC | PRN
  Administered 2011-04-04: 100 mg via INTRAVENOUS

## 2011-04-04 MED ORDER — ACETAMINOPHEN 10 MG/ML IV SOLN
INTRAVENOUS | Status: DC | PRN
  Administered 2011-04-04: 1000 mg via INTRAVENOUS

## 2011-04-04 MED ORDER — HYDROCORTISONE SOD SUCCINATE 100 MG IJ SOLR
INTRAMUSCULAR | Status: AC
  Filled 2011-04-04: qty 2

## 2011-04-04 MED ORDER — DEXTROSE 5 % IV SOLN
1.0000 g | INTRAVENOUS | Status: DC | PRN
  Administered 2011-04-04: 2 g via INTRAVENOUS

## 2011-04-04 MED ORDER — GLYCOPYRROLATE 0.2 MG/ML IJ SOLN
INTRAMUSCULAR | Status: DC | PRN
  Administered 2011-04-04: .6 mg via INTRAVENOUS

## 2011-04-04 MED ORDER — LACTATED RINGERS IV SOLN
INTRAVENOUS | Status: DC | PRN
  Administered 2011-04-04: 1000 mL via INTRAVENOUS

## 2011-04-04 MED ORDER — HYDROMORPHONE HCL PF 1 MG/ML IJ SOLN
INTRAMUSCULAR | Status: DC | PRN
  Administered 2011-04-04: 1 mg via INTRAVENOUS

## 2011-04-04 MED ORDER — HYDROCORTISONE SOD SUCCINATE 1000 MG IJ SOLR
INTRAMUSCULAR | Status: DC | PRN
  Administered 2011-04-04: 50 mg via INTRAVENOUS

## 2011-04-04 MED ORDER — STERILE WATER FOR IRRIGATION IR SOLN
Status: DC | PRN
  Administered 2011-04-04: 1000 mL

## 2011-04-04 MED ORDER — ACETAMINOPHEN 10 MG/ML IV SOLN
INTRAVENOUS | Status: AC
  Filled 2011-04-04: qty 100

## 2011-04-04 MED ORDER — ONDANSETRON HCL 4 MG/2ML IJ SOLN
INTRAMUSCULAR | Status: DC | PRN
  Administered 2011-04-04: 4 mg via INTRAVENOUS

## 2011-04-04 MED ORDER — LACTATED RINGERS IV SOLN
INTRAVENOUS | Status: DC
  Administered 2011-04-04 (×2): via INTRAVENOUS
  Administered 2011-04-04: 1000 mL via INTRAVENOUS

## 2011-04-04 MED ORDER — LIDOCAINE HCL (CARDIAC) 20 MG/ML IV SOLN
INTRAVENOUS | Status: DC | PRN
  Administered 2011-04-04: 80 mg via INTRAVENOUS

## 2011-04-04 MED ORDER — ENOXAPARIN SODIUM 40 MG/0.4ML ~~LOC~~ SOLN
40.0000 mg | SUBCUTANEOUS | Status: AC
  Administered 2011-04-04: 40 mg via SUBCUTANEOUS

## 2011-04-04 MED ORDER — BUPIVACAINE HCL (PF) 0.25 % IJ SOLN
INTRAMUSCULAR | Status: AC
  Filled 2011-04-04: qty 30

## 2011-04-04 MED ORDER — MIDAZOLAM HCL 5 MG/5ML IJ SOLN
INTRAMUSCULAR | Status: DC | PRN
  Administered 2011-04-04: 2 mg via INTRAVENOUS

## 2011-04-04 MED ORDER — OXYCODONE-ACETAMINOPHEN 5-325 MG PO TABS: 2.0000 | ORAL_TABLET | Freq: Four times a day (QID) | ORAL | Status: AC | PRN

## 2011-04-04 MED ORDER — CEFOXITIN SODIUM-DEXTROSE 1-4 GM-% IV SOLR (PREMIX)
INTRAVENOUS | Status: AC
  Filled 2011-04-04: qty 100

## 2011-04-04 MED ORDER — HYDROMORPHONE HCL PF 1 MG/ML IJ SOLN: 0.2500 mg | INTRAMUSCULAR | Status: DC | PRN

## 2011-04-04 MED ORDER — NEOSTIGMINE METHYLSULFATE 1 MG/ML IJ SOLN
INTRAMUSCULAR | Status: DC | PRN
  Administered 2011-04-04: 4 mg via INTRAVENOUS

## 2011-04-04 MED ORDER — LABETALOL HCL 5 MG/ML IV SOLN
INTRAVENOUS | Status: DC | PRN
  Administered 2011-04-04: 2.5 mg via INTRAVENOUS
  Administered 2011-04-04: 5 mg via INTRAVENOUS
  Administered 2011-04-04: 2.5 mg via INTRAVENOUS
  Administered 2011-04-04 (×2): 5 mg via INTRAVENOUS

## 2011-04-04 MED ORDER — SUFENTANIL CITRATE 50 MCG/ML IV SOLN
INTRAVENOUS | Status: DC | PRN
  Administered 2011-04-04 (×2): 10 ug via INTRAVENOUS
  Administered 2011-04-04: 15 ug via INTRAVENOUS
  Administered 2011-04-04: 10 ug via INTRAVENOUS
  Administered 2011-04-04: 15 ug via INTRAVENOUS
  Administered 2011-04-04: 10 ug via INTRAVENOUS

## 2011-04-04 MED ORDER — ROCURONIUM BROMIDE 100 MG/10ML IV SOLN
INTRAVENOUS | Status: DC | PRN
  Administered 2011-04-04: 10 mg via INTRAVENOUS
  Administered 2011-04-04: 55 mg via INTRAVENOUS
  Administered 2011-04-04: 5 mg via INTRAVENOUS

## 2011-04-04 SURGICAL SUPPLY — 46 items
APL SKNCLS STERI-STRIP NONHPOA (GAUZE/BANDAGES/DRESSINGS) ×1
BAG SPEC RTRVL LRG 6X4 10 (ENDOMECHANICALS) ×2
BENZOIN TINCTURE PRP APPL 2/3 (GAUZE/BANDAGES/DRESSINGS) ×2 IMPLANT
CHLORAPREP W/TINT 26ML (MISCELLANEOUS) ×3 IMPLANT
CLOTH BEACON ORANGE TIMEOUT ST (SAFETY) ×2 IMPLANT
CORD HIGH FREQUENCY UNIPOLAR (ELECTROSURGICAL) ×1 IMPLANT
CORDS BIPOLAR (ELECTRODE) ×2 IMPLANT
COVER SURGICAL LIGHT HANDLE (MISCELLANEOUS) ×2 IMPLANT
COVER TIP SHEARS 8 DVNC (MISCELLANEOUS) ×1 IMPLANT
COVER TIP SHEARS 8MM DA VINCI (MISCELLANEOUS) ×1
DECANTER SPIKE VIAL GLASS SM (MISCELLANEOUS) ×1 IMPLANT
DRAPE SURG IRRIG POUCH 19X23 (DRAPES) ×2 IMPLANT
DRAPE UTILITY XL STRL (DRAPES) ×2 IMPLANT
DRSG TEGADERM 6X8 (GAUZE/BANDAGES/DRESSINGS) ×2 IMPLANT
ELECT REM PT RETURN 9FT ADLT (ELECTROSURGICAL) ×2
ELECTRODE REM PT RTRN 9FT ADLT (ELECTROSURGICAL) ×1 IMPLANT
GAUZE VASELINE 3X9 (GAUZE/BANDAGES/DRESSINGS) ×1 IMPLANT
GLOVE BIO SURGEON STRL SZ 6.5 (GLOVE) ×6 IMPLANT
GLOVE BIO SURGEON STRL SZ7.5 (GLOVE) ×5 IMPLANT
GLOVE BIOGEL PI IND STRL 7.0 (GLOVE) ×2 IMPLANT
GLOVE BIOGEL PI INDICATOR 7.0 (GLOVE) ×2
GOWN PREVENTION PLUS XLARGE (GOWN DISPOSABLE) ×10 IMPLANT
GOWN STRL NON-REIN LRG LVL3 (GOWN DISPOSABLE) ×2 IMPLANT
HOLDER FOLEY CATH W/STRAP (MISCELLANEOUS) ×1 IMPLANT
KIT ACCESSORY DA VINCI DISP (KITS) ×1
KIT ACCESSORY DVNC DISP (KITS) ×1 IMPLANT
MANIPULATOR UTERINE 4.5 ZUMI (MISCELLANEOUS) ×2 IMPLANT
OCCLUDER COLPOPNEUMO (BALLOONS) ×2 IMPLANT
PACK ROBOTIC CUSTOM GYN (CUSTOM PROCEDURE TRAY) ×2 IMPLANT
POUCH SPECIMEN RETRIEVAL 10MM (ENDOMECHANICALS) ×4 IMPLANT
SET TUBE IRRIG SUCTION NO TIP (IRRIGATION / IRRIGATOR) ×2 IMPLANT
SOLUTION ELECTROLUBE (MISCELLANEOUS) ×2 IMPLANT
SPONGE LAP 18X18 X RAY DECT (DISPOSABLE) IMPLANT
STRIP CLOSURE SKIN 1/2X4 (GAUZE/BANDAGES/DRESSINGS) ×2 IMPLANT
SUT VIC AB 0 CT1 27 (SUTURE) ×2
SUT VIC AB 0 CT1 27XBRD ANTBC (SUTURE) ×3 IMPLANT
SUT VIC AB 4-0 PS2 27 (SUTURE) ×4 IMPLANT
SUT VICRYL 0 UR6 27IN ABS (SUTURE) ×1 IMPLANT
SYR BULB IRRIGATION 50ML (SYRINGE) IMPLANT
TRAP SPECIMEN MUCOUS 40CC (MISCELLANEOUS) ×2 IMPLANT
TROCAR 12M 150ML BLUNT (TROCAR) ×1 IMPLANT
TROCAR BLADELESS OPT 5 100 (ENDOMECHANICALS) ×1 IMPLANT
TROCAR BLADELESS OPT 5 150 (ENDOMECHANICALS) ×1 IMPLANT
TROCAR XCEL 12X100 BLDLESS (ENDOMECHANICALS) ×1 IMPLANT
TUBING FILTER THERMOFLATOR (ELECTROSURGICAL) ×1 IMPLANT
WATER STERILE IRR 1500ML POUR (IV SOLUTION) ×4 IMPLANT

## 2011-04-04 NOTE — Anesthesia Postprocedure Evaluation (Signed)
  Anesthesia Post-op Note  Patient: Denise [name]  Procedure(s) Performed:  ROBOTIC ASSISTED BILATERAL SALPINGO OOPHERECTOMY  Patient Location: PACU  Anesthesia Type: General  Level of Consciousness: awake and alert   Airway and Oxygen Therapy: Patient Spontanous Breathing  Post-op Pain: mild  Post-op Assessment: Post-op Vital signs reviewed, Patient's Cardiovascular Status Stable, Respiratory Function Stable, Patent Airway and No signs of Nausea or vomiting  Post-op Vital Signs: stable  Complications: No apparent anesthesia complications

## 2011-04-04 NOTE — H&P (View-Only) (Signed)
Consult Note: Gyn-Onc  Consult was requested by Dr. Donzetta Matters for the evaluation of Denise [name] 61 y.o. female  CC:  Chief Complaint  Patient presents with  . Ovarian Mass    New pt    HPI:  Denise [name] is a 61 year old nulliparous female with low back pain since early 2011. An MRI of the lumbar spine was obtained and showed an abnormality in the area of the adnexa.  A transvaginal ultrasound obtained on 01/19/2011 showed a right ovary measuring 4.6 x 3.4 3.1 cm with a small complex lesion measuring 1.6 x 1.4 x 1.1 cm. Left ovary measures 4.7 x 3.2 x 3.3 cm and complete a complex cyst the cyst is unilocular and contain a mural nodule measuring 1.2 x 1.2 x 1.6 cm the mural nodule had both solid and cystic components. No color flow is identified within the nodule there is a trace amount of simple free fluid in the cul-de-sac.   An MRI of the pelvis was completed on 01/29/2011. It demonstrated a postmenopausal uterus measuring 6.5 x 3.1 x 3.7 cm. A 4.9 x 4.1 x 3.6 cm right lateral subserosal fibroids appreciated. Endometrial stripe measured 5 mm. Left ovary contained a complex cystic lesion measuring 3.0 x 2.8 x 2.4 cm. This lesion showed a few thin internal septations and a small less than 1 cm mural nodular density. The right ovary contained a 1.5 cm cyst consistent with endometrioma. There is no evidence of pelvic lymphadenopathy or free fluid sigmoid diverticulosis was identified  Interval History: Denise [name] denies any pelvic pain weight loss or satiety change in appetite alternating diarrhea constipation or bleeding of the rectum bladder or vagina.  Review of Systems:  Constitutional  Feels well,  Skin/Breast  No rash, sores, jaundice, itching, dryness Cardiovascular  No chest pain, shortness of breath, or edema  Pulmonary  No cough or wheeze.  Gastro Intestinal  No nausea, vomitting, or diarrhoea. No bright red blood per rectum, no abdominal pain, change in bowel movement, or  constipation. No bloating no early satiety or change in appetite. Genito Urinary  No frequency, urgency, dysuria, no vaginal bleeding vaginal discharge. Musculo Skeletal  No myalgia, arthralgia, joint swelling or pain  Neurologic  No weakness, numbness, change in gait,  Psychology  No depression, anxiety, insomnia.    Current Meds:  Outpatient Encounter Prescriptions as of 03/29/2011  Medication Sig Dispense Refill  . amLODipine (NORVASC) 5 MG tablet Take 5 mg by mouth daily.      Marland Kitchen aspirin 81 MG tablet Take 81 mg by mouth daily.        . calcium carbonate (OS-CAL - DOSED IN MG OF ELEMENTAL CALCIUM) 1250 MG tablet Take 1 tablet by mouth daily.        . Calcium Carbonate Antacid (TUMS PO) Take by mouth.        . Ferrous Gluconate (IRON) 240 (27 FE) MG TABS Take by mouth daily.       Marland Kitchen gabapentin (NEURONTIN) 100 MG capsule TAKE 1 CAPSULE 3 TIMES A DAY  90 capsule  2  . Multiple Vitamin (MULTIVITAMIN) capsule Take 1 capsule by mouth daily.        . traMADol (ULTRAM) 50 MG tablet Take 50 mg by mouth every 6 (six) hours as needed. Taking twice a day      . vitamin B-12 (CYANOCOBALAMIN) 250 MCG tablet Take 250 mcg by mouth daily.        . Acetaminophen-Codeine 300-30 MG per tablet Take 1-2  tablets by mouth every 6 (six) hours as needed for pain.  40 tablet  1   Prednisone 10 mg twice daily for the past day and a half Allergy: No Known Allergies  Social Hx:   History   Social History  . Marital Status: Single    Spouse Name: N/A    Number of Children: N/A  . Years of Education: 12th grade   Occupational History  . unemployed    Social History Main Topics  . Smoking status: Former Smoker    Types: Cigarettes    Quit date: 03/20/1976  . Smokeless tobacco: Not on file  . Alcohol Use: No  . Drug Use: Not on file  . Sexually Active: Not on file   Other Topics Concern  . Not on file   Social History Narrative  . No narrative on file    Past Surgical Hx:  Past Surgical  History  Procedure Date  . Gallbladder surgery   . Gastric bypass     Past Medical Hx:  Past Medical History  Diagnosis Date  . HTN (hypertension)   . Anemia   . HTN (hypertension)     Past Gynecological History: Gravida 0 menarche in early teenage years with regular menses last Pap test November 2012 within normal limits. Denies any history of abnormal Pap tests are abnormal uterine bleeding  Family Hx:  Family History  Problem Relation Age of Onset  . Arthritis    . Cancer    . Breast cancer Mother   . Hypertension Father   . Diabetes Father     Vitals:  Blood pressure 140/84, pulse 68, temperature 97.9 F (36.6 C), resp. rate 20, height 5' 2.32" (1.583 m), weight 260 lb 3.2 oz (118.026 kg).  Physical Exam: WD in NAD Neck  Supple NROM, without any enlargements.  Lymph Node Survey No cervical supraclavicular or inguinal adenopathy Cardiovascular  Pulse normal rate, regularity and rhythm. S1 and S2 normal.  Lungs  Clear to auscultation bilateraly, without wheezes/crackles/rhonchi. Good air movement.  Skin  No rash/lesions/breakdown  Psychiatry  Alert and oriented to person, place, and time  Abdomen  Normoactive bowel sounds, abdomen soft, non-tender and obese. Surgical  sites intact without evidence of hernia. No palpable fluid wave or omental cake no evidence of hepatosplenomegaly Back No CVA tenderness Genito Urinary  Vulva/vagina: Normal external female genitalia.  No lesions. No discharge or bleeding.  Bladder/urethra:  No lesions or masses  Vagina: No evidence of prolapse atrophic.  Narrow vagina  Cervix: Normal appearing, no lesions. Ultimately 1.5 cm  Uterus: Small, mobile, no parametrial involvement or nodularity. Right broad ligament fibroid palpable at the inferior aspect. The uterus is very well supported.  Adnexa: No palpable masses. Rectal  Good tone, no masses no cul de sac nodularity.  Extremities  No bilateral cyanosis, clubbing or  edema.   Assessment/Plan:  Denise [name]  is a 61 y.o.  year old with a complex left-sided adnexal mass with a mural nodule. There is a right-sided mass consistent with endometrioma. CA 125 is within normal limits the value of 9.4. Recommendations was given to the patient for bilateral salpingo-oophorectomy, with the inclusion of a hysterectomy if that were her preference. Should Denise [name] is aware that if the frozen section diagnosis consistent with a malignancy the standard of care would include hysterectomy omentectomy and possible lymph node dissection. The goal is to attempt a minimally invasive approach given her morbid obesity.  The patient at this time  elects for minimally invasive bilateral salpingo-oophorectomy. This likely will be completed robotic assistance.  The patient is aware that they're always exists the possibility of not being able to complete the procedure in a minimally invasive approach and that laparotomy may be required for safety. In the event of malignancy is identified at that time it is likely that the procedure will be completed via a laparotomy.  Risks and benefits of the procedure discussed with the patient her mother and her niece were that of infection bleeding damage to surrounding structures prolonged hospitalization or reoperation.  It was noted that she has been on prednisone for a day and a half. I've asked that she discontinue this until after surgery.  The plan is to complete the surgery on Tuesday, 04/04/2011     Laurette Schimke, MD, PhD 03/29/2011, 12:09 PM

## 2011-04-04 NOTE — Op Note (Signed)
Preoperative Diagnosis: Bilateral adnexal masses  Morbid obesity  Postoperative Diagnosis: Benign bilateral adnexal masses  Procedure(s) Performed: Robotic-assisted laparoscopic bilateral salpingo-oophorectomy  Surgeon: Maryclare Labrador.  Nelly Rout, M.D. PhD  Assistant Surgeon: Antionette Char M.D. Assistant: Telford Nab RN, MSN  Anesthesia: Gen. endotracheal. 0.25% Marcaine was placed at the site of all the trochars prior to incision  Specimens: Bilateral ovaries, fallopian tubes, pelvic washings  Estimated Blood Loss: Minimal mL. Blood Replacement: None  Complications: None.   Indication for Procedure: This is a 61 year old with ultrasound was noted to have bilateral adnexal masses. A left adnexal mass was noted to have a mural nodule. CA 125 is within normal limits. Ultrasound was remarkable for right broad ligament fibroid.  Operative Findings: Cervix displaced anteriorly almost flush with the vaginal vault. A right broad ligament fibroid extending from the round ligament to the cervical isthmus. Bilateral adnexal masses. No evidence of metastatic peritoneal disease. Left upper abdominal adhesions.  Frozen pathology was consistent with benign bilateral adnexal masses.  Procedure: The patient's taken to the operating room and placed under general endotracheal anesthesia testing difficulty. She is placed in a dorsolithotomy position and cervical acromial pad was placed. The arms were tucked with care taken to pad the olecranon process. And prepped and draped in usual sterile fashion. An attempt was made to place a uterine manipulator however placement was limited by the narrow cervical introitus along vagina the cervical effacement. Vaginal bruising was created an attempt to place this uterine manipulator. A sponge stick was placed in the vagina to facilitate movement of the uterus. Given the patient's upper abdominal surgical sites and the umbilical entry was performed. A 10 mm incision was  made just superior to the umbilicus and a 5 mm Optiview trocar used to enter the abdomen under direct visualization. With entry into the abdomen and then maintenance of 15 mm of mercury the patient was placed in Trendelenburg position. An incision was made approximately 2 cm below the left mid costal margin. It was very difficult to place a trocar through this site given the upper abdominal adhesions. Insight incision was made approximately 2 cm inferior at the prior surgical site through which a 10 mm trocar was inserted under direct visualization. Incisions were made approximately 10 cm lateral to the umbilical incision. 8 mm robotic trochars were inserted. The robot was docked.  The abdomen was inspected as was the pelvis.  Pelvic washings were obtained. An incision was made through the right ligament and the retroperitoneal space entered. The ureter was identified and the right infundibulopelvic vessels were skeletonized cauterized and transected. The utero-ovarian ligaments similarly were cauterized and transected. Specimen was placed in an Endo Catch bag.  In a similar manner the left round ligament was transected retroperitoneal space entered. The utero-ovarian ligament was skeletonized cauterized and transected. The left utero-ovarian ligaments were cauterized and transected in the left adnexa was placed in an Endo Catch bag.  The abdomen was copiously irrigated and drained and all operative sites inspected and hemostasis was assured  The robot was undocked. The camera was placed through the left upper abdominal incision. The contents of the left Endo Catch bag were first aspirated and then morcellated to facilitate removal from the abdominal cavity through the umbilical incision. In a similar fashion the contents of the right Endo Catch bag or morcellated to facilitate removal from the abdominal cavity. Specimens were sent for frozen section evaluation.  While awaiting the results of frozen section  an attempt again was made to  insert a uterine manipulator. The vagina was very narrow and the position of the cervix did not facilitate insertion of a uterine manipulator. The frozen section returned with the results of the bilateral adnexal masses were benign.     The ports were all removed and the subcutaneous tissues were irrigated.  The depth of the subcutaneous fat did not allow for fascial closure of the umbilical incision therefore the deep subcutaneous tissues were reapproximated with 0 Vicryl.  All incisions were closed with a running subcuticular Monocryl suture. Dermabond was placed over the incision sites of the left upper abdominal incisions.  Sponge, lap and needle counts were correct x 3.    The patient had sequential compression devices for VTE prophylaxis.         Disposition: PACU - hemodynamically stable.         Condition: Stable

## 2011-04-04 NOTE — Transfer of Care (Signed)
Immediate Anesthesia Transfer of Care Note  Patient: Denise [name]  Procedure(s) Performed:  ROBOTIC ASSISTED BILATERAL SALPINGO OOPHERECTOMY  Patient Location: PACU  Anesthesia Type: General  Level of Consciousness: awake, alert , oriented and responds to stimulation  Airway & Oxygen Therapy: Patient Spontanous Breathing and Patient connected to face mask oxygen  Post-op Assessment: Report given to PACU RN and Post -op Vital signs reviewed and stable  Post vital signs: stable Filed Vitals:   04/04/11 1455  BP: 146/79  Pulse: 74  Temp: 36.3 C  Resp: 11    Complications: No apparent anesthesia complications

## 2011-04-04 NOTE — Interval H&P Note (Signed)
History and Physical Interval Note:  04/04/2011 11:09 AM  Denise [name]  has presented today for surgery, with the diagnosis of adnexal mass  The various methods of treatment have been discussed with the patient and family. After consideration of risks, benefits and other options for treatment, the patient has consented to  Procedure(s): ROBOTIC ASSISTED BILATERAL SALPINGO OOPHORECTOMY POSSIBLE HYSTERECTOMY LYMPH NODE DISSECTION, STAGING AND LAPAROTOMY  as a surgical intervention .  The patients' history has been reviewed, patient examined, no change in status, stable for surgery.  I have reviewed the patients' chart and labs.  Questions were answered to the patient's satisfaction.     Laurette Schimke, MD

## 2011-04-04 NOTE — Anesthesia Preprocedure Evaluation (Signed)
Anesthesia Evaluation  Patient identified by MRN, date of birth, ID band Patient awake    Reviewed: Allergy & Precautions, H&P , NPO status , Patient's Chart, lab work & pertinent test results  Airway Mallampati: II TM Distance: >3 FB Neck ROM: Full    Dental No notable dental hx.    Pulmonary neg pulmonary ROS,  clear to auscultation  Pulmonary exam normal       Cardiovascular hypertension, Pt. on medications neg cardio ROS Regular Normal    Neuro/Psych Negative Neurological ROS  Negative Psych ROS   GI/Hepatic negative GI ROS, Neg liver ROS,   Endo/Other  Negative Endocrine ROSDiabetes mellitus-, Type 2  Renal/GU negative Renal ROS  Genitourinary negative   Musculoskeletal negative musculoskeletal ROS (+)   Abdominal   Peds negative pediatric ROS (+)  Hematology negative hematology ROS (+)   Anesthesia Other Findings   Reproductive/Obstetrics negative OB ROS                           Anesthesia Physical Anesthesia Plan  ASA: III  Anesthesia Plan: General   Post-op Pain Management:    Induction: Intravenous  Airway Management Planned:   Additional Equipment:   Intra-op Plan:   Post-operative Plan: Extubation in OR  Informed Consent: I have reviewed the patients History and Physical, chart, labs and discussed the procedure including the risks, benefits and alternatives for the proposed anesthesia with the patient or authorized representative who has indicated his/her understanding and acceptance.   Dental advisory given  Plan Discussed with: CRNA  Anesthesia Plan Comments:         Anesthesia Quick Evaluation

## 2011-04-11 ENCOUNTER — Emergency Department (HOSPITAL_COMMUNITY): Payer: PRIVATE HEALTH INSURANCE

## 2011-04-11 ENCOUNTER — Inpatient Hospital Stay (HOSPITAL_COMMUNITY)
Admission: EM | Admit: 2011-04-11 | Discharge: 2011-04-14 | DRG: 354 | Disposition: A | Discharge status: Home / Self Care | Payer: PRIVATE HEALTH INSURANCE | Attending: General Surgery | Admitting: General Surgery

## 2011-04-11 ENCOUNTER — Emergency Department (HOSPITAL_COMMUNITY): Payer: PRIVATE HEALTH INSURANCE | Admitting: Anesthesiology

## 2011-04-11 ENCOUNTER — Encounter (HOSPITAL_COMMUNITY): Admission: EM | Disposition: A | Discharge status: Home / Self Care | Payer: Self-pay | Source: Home / Self Care

## 2011-04-11 ENCOUNTER — Encounter (HOSPITAL_COMMUNITY): Payer: Self-pay | Admitting: Emergency Medicine

## 2011-04-11 ENCOUNTER — Encounter (HOSPITAL_COMMUNITY): Payer: Self-pay | Admitting: Anesthesiology

## 2011-04-11 ENCOUNTER — Other Ambulatory Visit: Payer: Self-pay

## 2011-04-11 DIAGNOSIS — K929 Disease of digestive system, unspecified: Secondary | ICD-10-CM | POA: Diagnosis not present

## 2011-04-11 DIAGNOSIS — K56609 Unspecified intestinal obstruction, unspecified as to partial versus complete obstruction: Secondary | ICD-10-CM

## 2011-04-11 DIAGNOSIS — Z9884 Bariatric surgery status: Secondary | ICD-10-CM

## 2011-04-11 DIAGNOSIS — R109 Unspecified abdominal pain: Secondary | ICD-10-CM

## 2011-04-11 DIAGNOSIS — I10 Essential (primary) hypertension: Secondary | ICD-10-CM | POA: Diagnosis present

## 2011-04-11 DIAGNOSIS — Z9079 Acquired absence of other genital organ(s): Secondary | ICD-10-CM

## 2011-04-11 DIAGNOSIS — E119 Type 2 diabetes mellitus without complications: Secondary | ICD-10-CM | POA: Diagnosis present

## 2011-04-11 DIAGNOSIS — M129 Arthropathy, unspecified: Secondary | ICD-10-CM | POA: Diagnosis present

## 2011-04-11 DIAGNOSIS — Y838 Other surgical procedures as the cause of abnormal reaction of the patient, or of later complication, without mention of misadventure at the time of the procedure: Secondary | ICD-10-CM | POA: Diagnosis not present

## 2011-04-11 DIAGNOSIS — K430 Incisional hernia with obstruction, without gangrene: Principal | ICD-10-CM | POA: Diagnosis present

## 2011-04-11 DIAGNOSIS — K560 Paralytic ileus: Secondary | ICD-10-CM | POA: Diagnosis not present

## 2011-04-11 HISTORY — PX: INCISIONAL HERNIA REPAIR: SHX193

## 2011-04-11 HISTORY — PX: LAPAROTOMY: SHX154

## 2011-04-11 LAB — CBC
HCT: 42.4 % (ref 36.0–46.0)
Hemoglobin: 13.8 g/dL (ref 12.0–15.0)
MCH: 26.4 pg (ref 26.0–34.0)
MCHC: 32.5 g/dL (ref 30.0–36.0)
MCV: 81.2 fL (ref 78.0–100.0)
Platelets: 338 10*3/uL (ref 150–400)
RBC: 5.22 MIL/uL — ABNORMAL HIGH (ref 3.87–5.11)
RDW: 14.6 % (ref 11.5–15.5)
WBC: 9.5 10*3/uL (ref 4.0–10.5)

## 2011-04-11 LAB — LIPASE, BLOOD: Lipase: 11 U/L (ref 11–59)

## 2011-04-11 LAB — GLUCOSE, CAPILLARY
Glucose-Capillary: 110 mg/dL — ABNORMAL HIGH (ref 70–99)
Glucose-Capillary: 158 mg/dL — ABNORMAL HIGH (ref 70–99)

## 2011-04-11 LAB — URINALYSIS, ROUTINE W REFLEX MICROSCOPIC
Glucose, UA: NEGATIVE mg/dL
Hgb urine dipstick: NEGATIVE
Ketones, ur: 15 mg/dL — AB
Leukocytes, UA: NEGATIVE
Nitrite: NEGATIVE
Protein, ur: 30 mg/dL — AB
Specific Gravity, Urine: 1.039 — ABNORMAL HIGH (ref 1.005–1.030)
Urobilinogen, UA: 1 mg/dL (ref 0.0–1.0)
pH: 5.5 (ref 5.0–8.0)

## 2011-04-11 LAB — DIFFERENTIAL
Basophils Absolute: 0 10*3/uL (ref 0.0–0.1)
Basophils Relative: 0 % (ref 0–1)
Eosinophils Absolute: 0 10*3/uL (ref 0.0–0.7)
Eosinophils Relative: 0 % (ref 0–5)
Lymphocytes Relative: 10 % — ABNORMAL LOW (ref 12–46)
Lymphs Abs: 1 10*3/uL (ref 0.7–4.0)
Monocytes Absolute: 0.5 10*3/uL (ref 0.1–1.0)
Monocytes Relative: 5 % (ref 3–12)
Neutro Abs: 8 10*3/uL — ABNORMAL HIGH (ref 1.7–7.7)
Neutrophils Relative %: 85 % — ABNORMAL HIGH (ref 43–77)

## 2011-04-11 LAB — COMPREHENSIVE METABOLIC PANEL
ALT: 13 U/L (ref 0–35)
AST: 22 U/L (ref 0–37)
Albumin: 3.8 g/dL (ref 3.5–5.2)
Alkaline Phosphatase: 100 U/L (ref 39–117)
BUN: 10 mg/dL (ref 6–23)
CO2: 26 mEq/L (ref 19–32)
Calcium: 10.5 mg/dL (ref 8.4–10.5)
Chloride: 101 mEq/L (ref 96–112)
Creatinine, Ser: 0.72 mg/dL (ref 0.50–1.10)
GFR calc Af Amer: >90 mL/min (ref >90)
GFR calc non Af Amer: >90 mL/min (ref >90)
Glucose, Bld: 161 mg/dL — ABNORMAL HIGH (ref 70–99)
Potassium: 3.9 mEq/L (ref 3.5–5.1)
Sodium: 141 mEq/L (ref 135–145)
Total Bilirubin: 0.3 mg/dL (ref 0.3–1.2)
Total Protein: 7.7 g/dL (ref 6.0–8.3)

## 2011-04-11 LAB — URINE MICROSCOPIC-ADD ON

## 2011-04-11 IMAGING — CR DG ABDOMEN ACUTE W/ 1V CHEST
4 series · 4 of 4 positions shown · non-contrast
Comparison: Abdominal radiograph performed [DATE], and CT of
the abdomen and pelvis performed [DATE]; chest radiograph
performed [DATE]

CLINICAL DATA: Nausea and lower abdominal pain for 72 hours.

ACUTE ABDOMEN SERIES (ABDOMEN 2 VIEW & CHEST 1 VIEW)

[w chest pa]
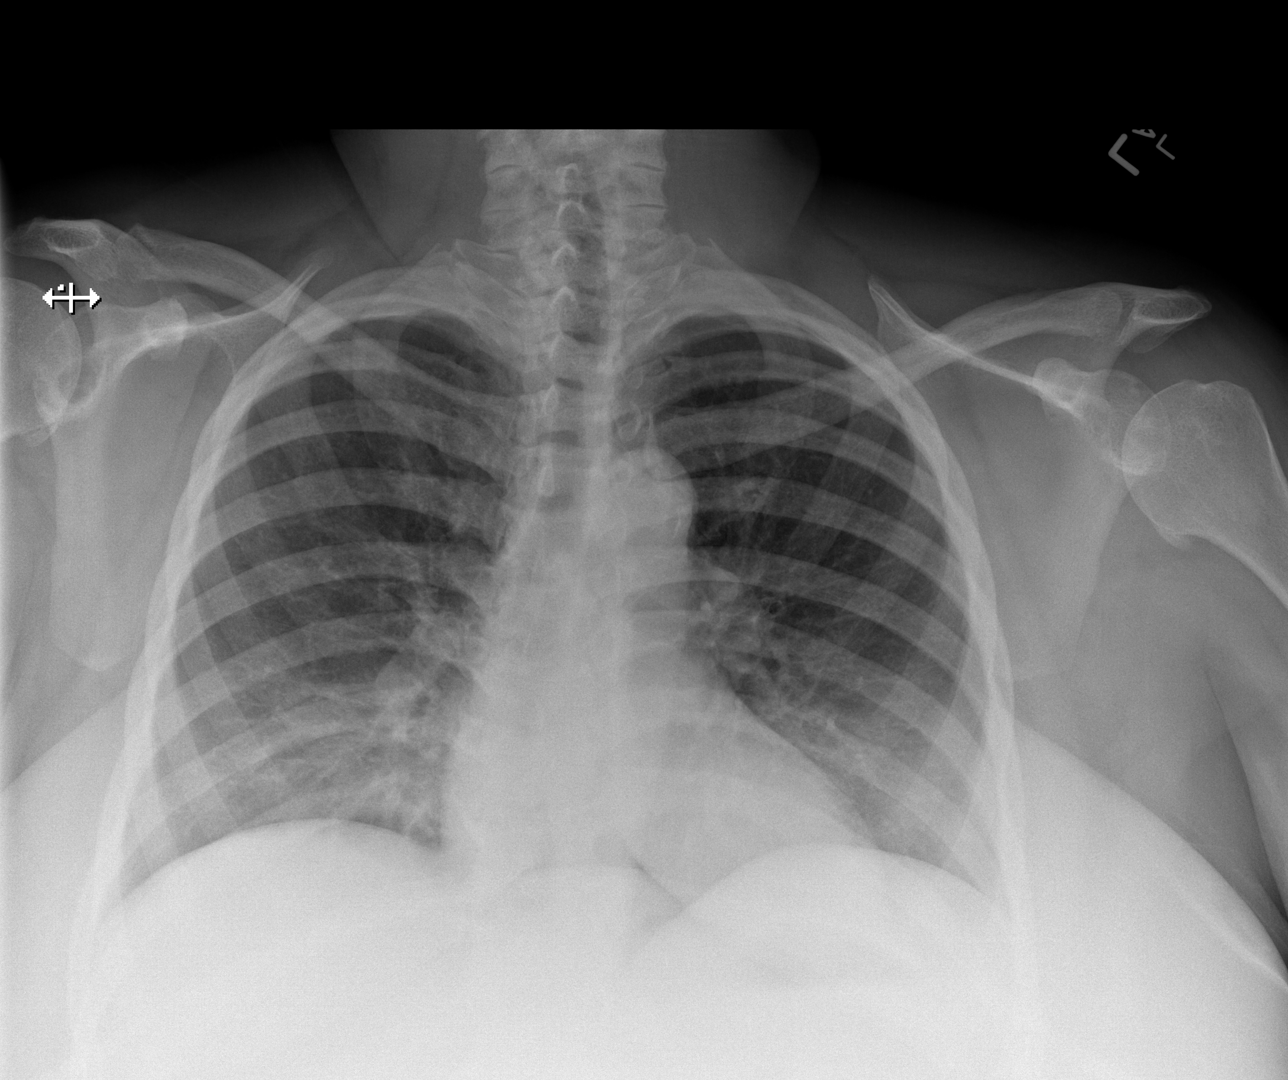

[w abdomen upright]
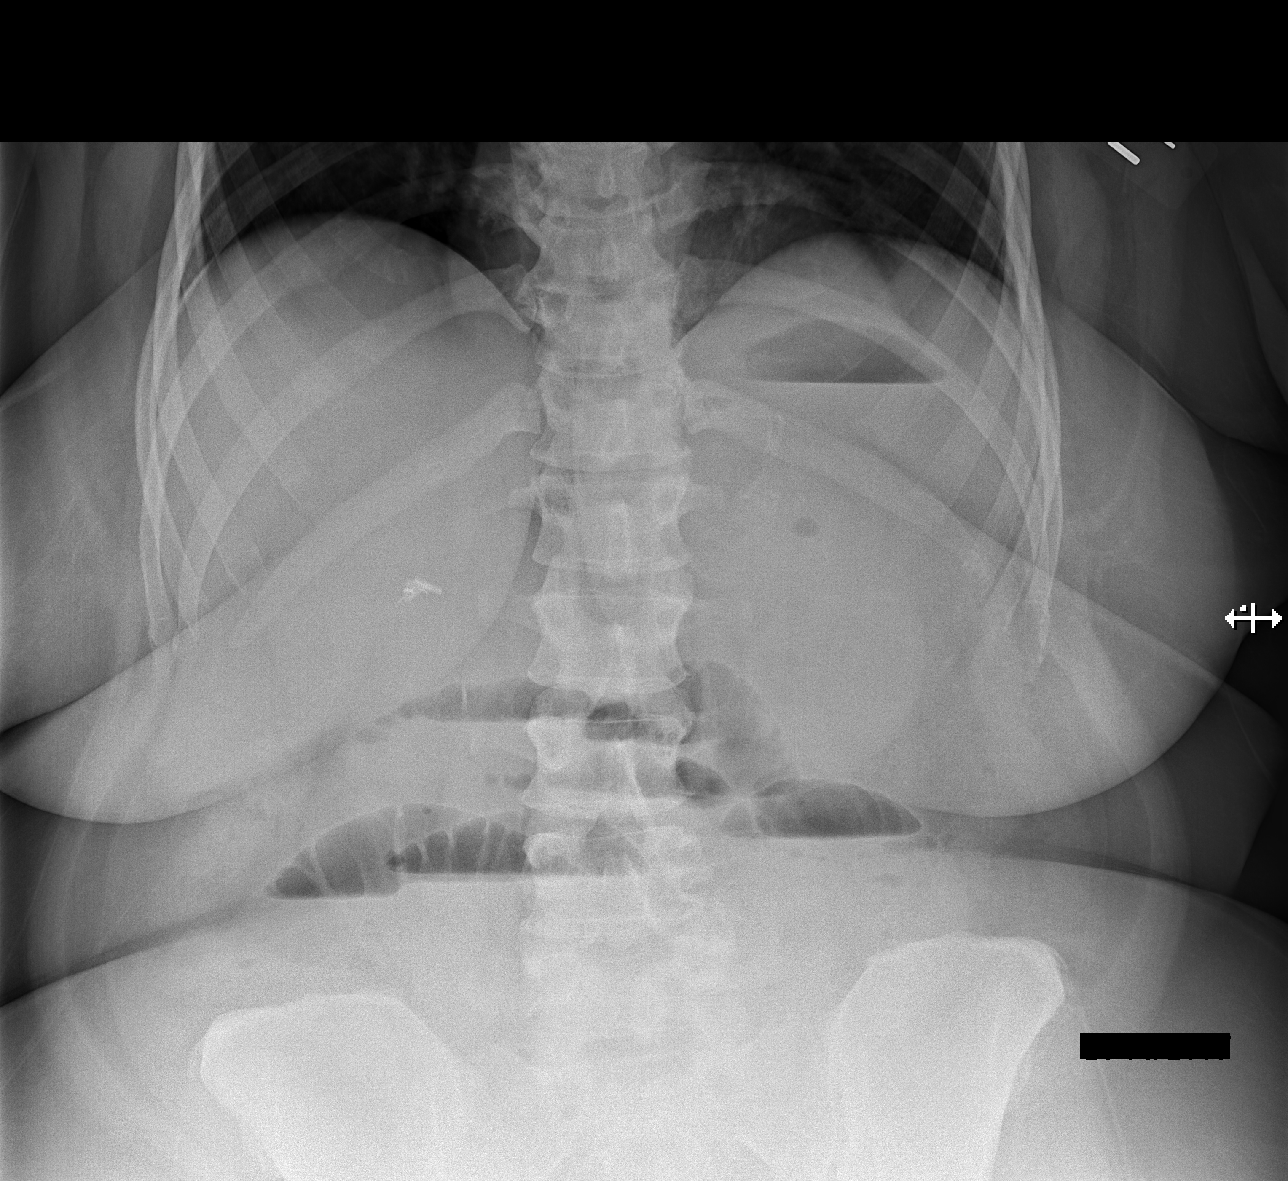

[t abdomen supine (1 of 2)]
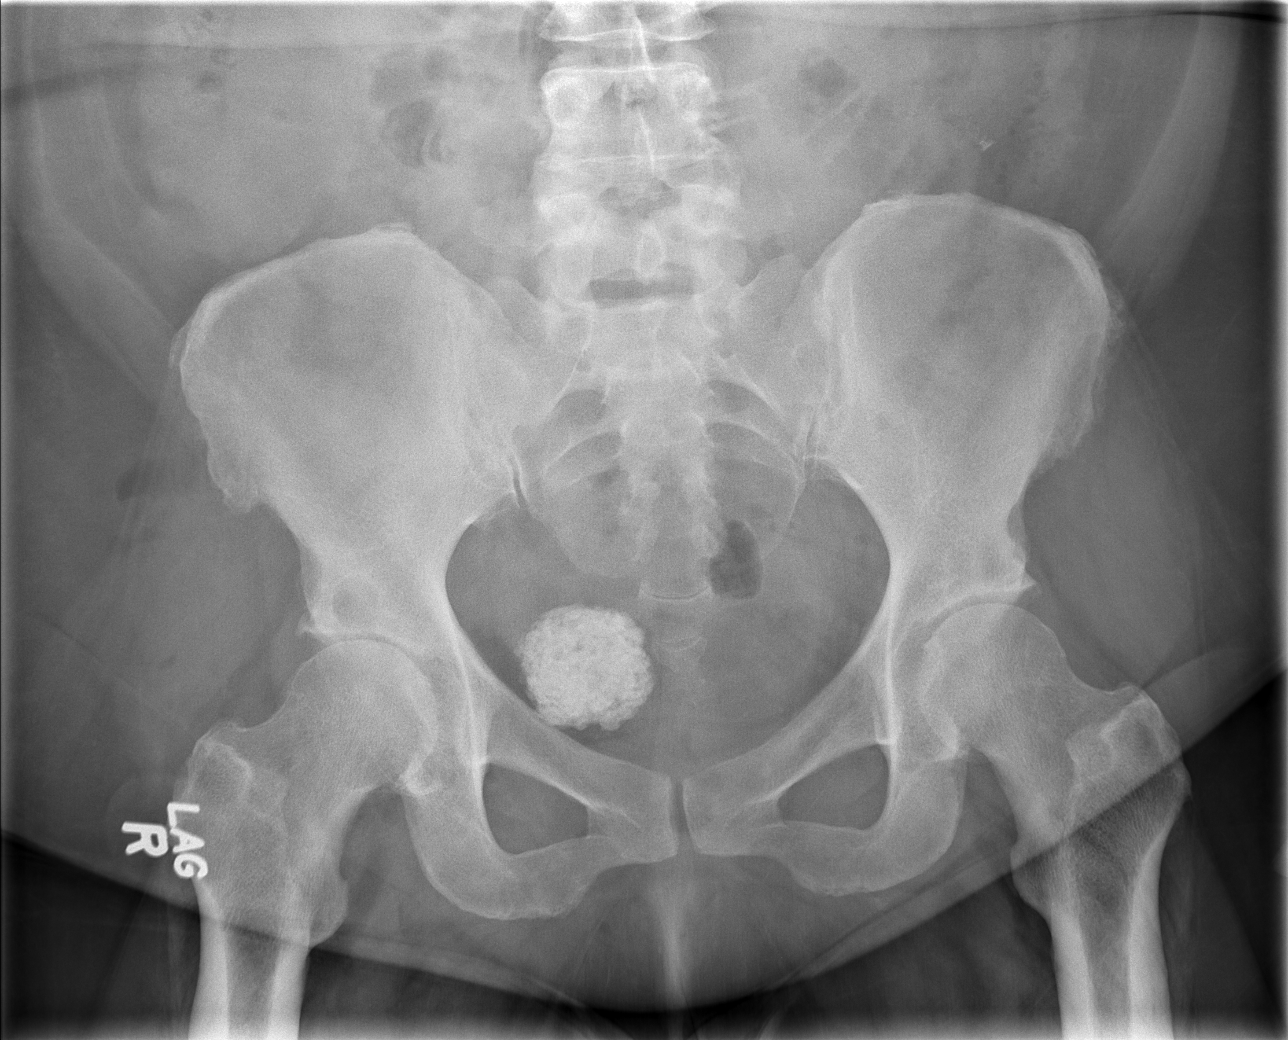

[t abdomen supine (2 of 2)]
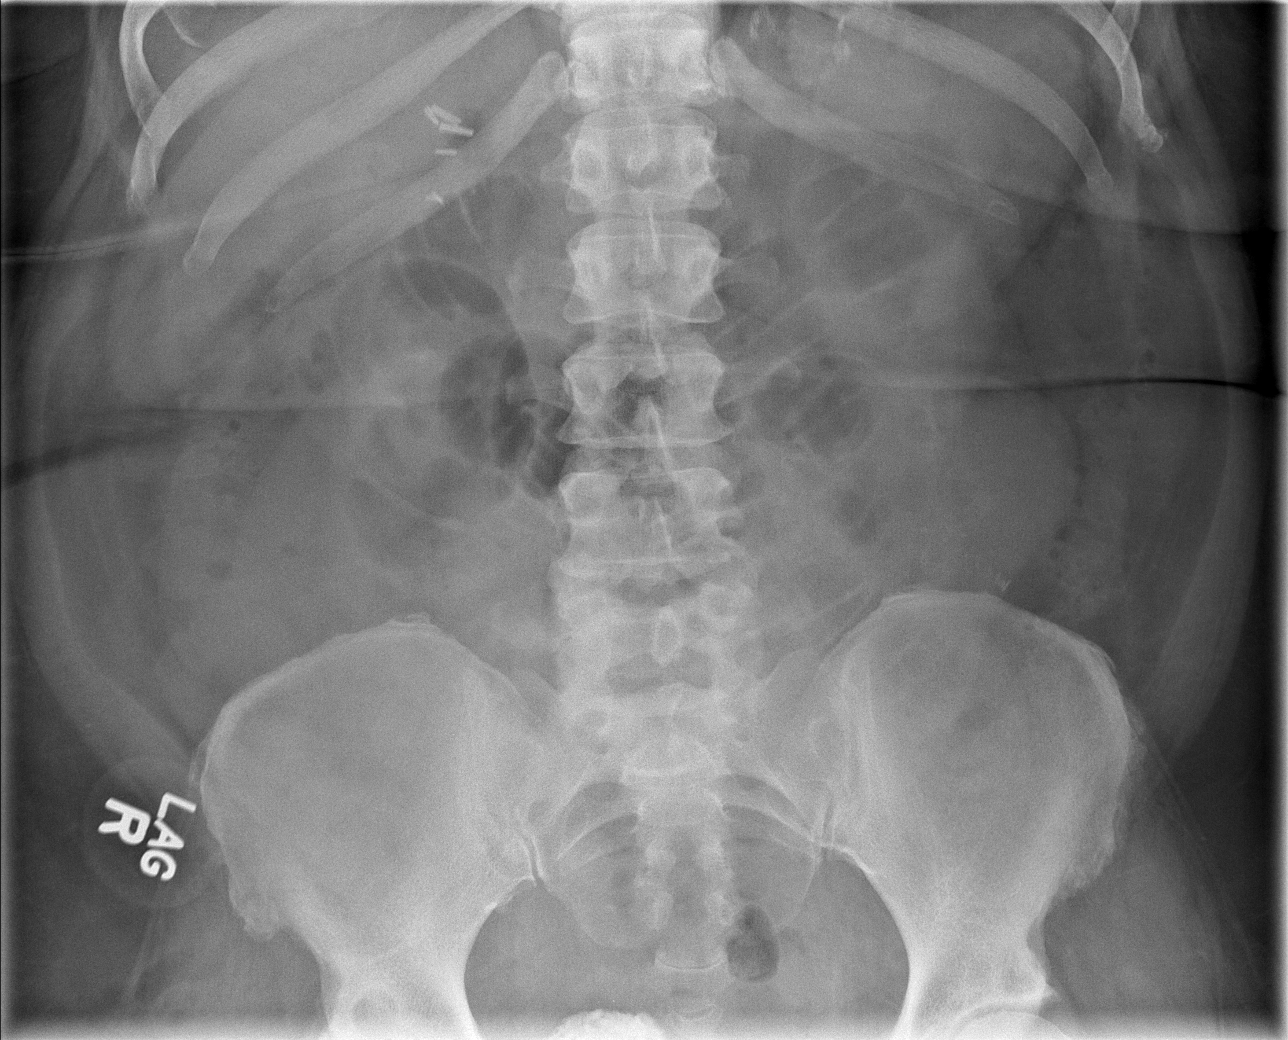

[4 of 4 positions shown; findings below may reference images not displayed]

FINDINGS: The lungs are well-aerated and clear.  There is no
evidence of focal opacification, pleural effusion or pneumothorax.
The cardiomediastinal silhouette is within normal limits.

There is diffuse distension of small bowel loops to 4.4 cm in
maximal diameter, with associated air-fluid levels, concerning for
at least some degree of bowel obstruction.  The colon appears
relatively decompressed.  No free intra-abdominal air is identified
on the provided upright view.  Clips are noted within the right
upper quadrant, reflecting prior cholecystectomy.

No acute osseous abnormalities are seen; the sacroiliac joints are
unremarkable in appearance.  A calcified uterine fibroid is again
noted.
IMPRESSION: 1.  Diffuse distension of small bowel loops to 4.4 cm in maximal
diameter, with associated air-fluid levels, concerning for at least
some degree of bowel obstruction.  No free intra-abdominal air
seen.
2.  No acute cardiopulmonary process identified.
3.  Calcified uterine fibroid again seen.

Findings were discussed with TIGER at [DATE] a.m. on
[DATE].

## 2011-04-11 IMAGING — CT CT ABD-PELV W/ CM
1 of 5 series · 12 of 32 positions shown, 17 images · IV contrast (100 ML OMNI 300)
Comparison: CT of the abdomen and pelvis from [DATE], and
abdominal radiograph performed earlier today at [DATE] a.m.

CLINICAL DATA: Abdominal pain, nausea and vomiting; recent total
hysterectomy.

CT ABDOMEN AND PELVIS WITH CONTRAST
TECHNIQUE: Multidetector CT imaging of the abdomen and pelvis was
performed following the standard protocol during bolus
administration of intravenous contrast.
Contrast: 100mL OMNIPAQUE IOHEXOL 300 MG/ML IV SOLN

[Series 2: abd/pel with · axial · 0.73mm/px · z∈[+1740,+2100]mm · 12 of 82 slices shown, 17 images]
[im 5/82  soft-tissue]
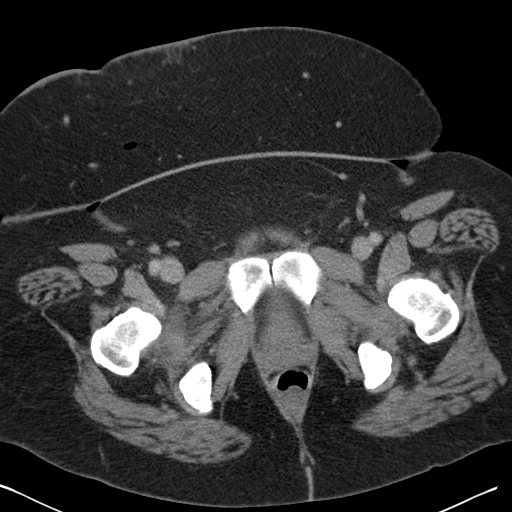
[im 5/82  bone]
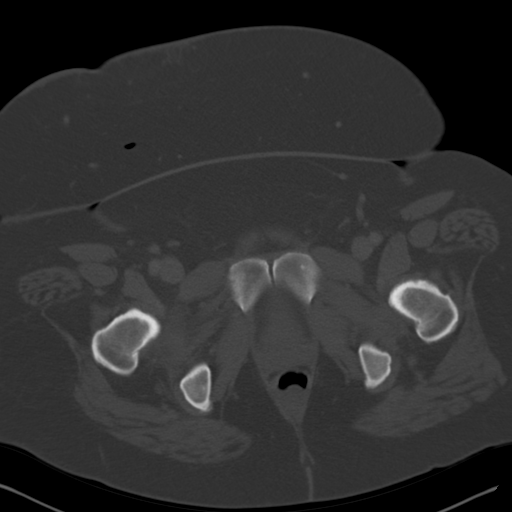
[im 14/82  soft-tissue]
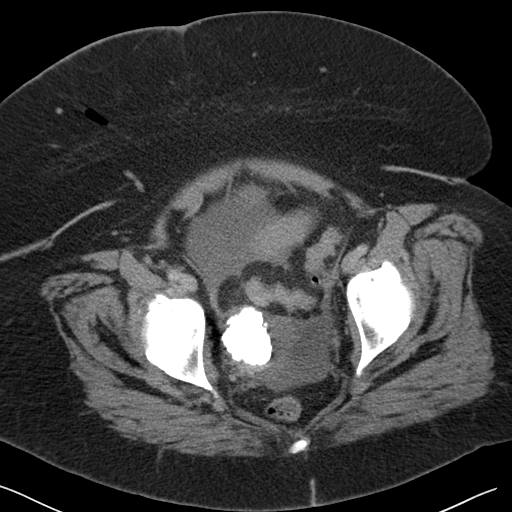
[im 19/82  soft-tissue]
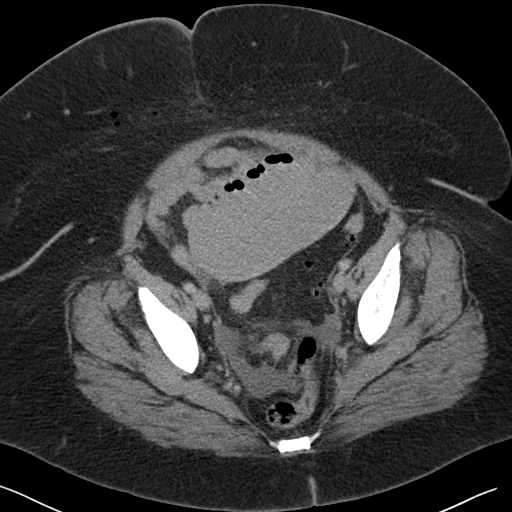
[im 28/82  soft-tissue]
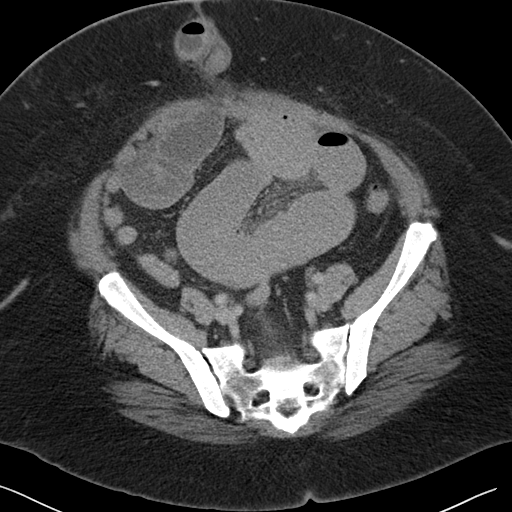
[im 32/82  soft-tissue]
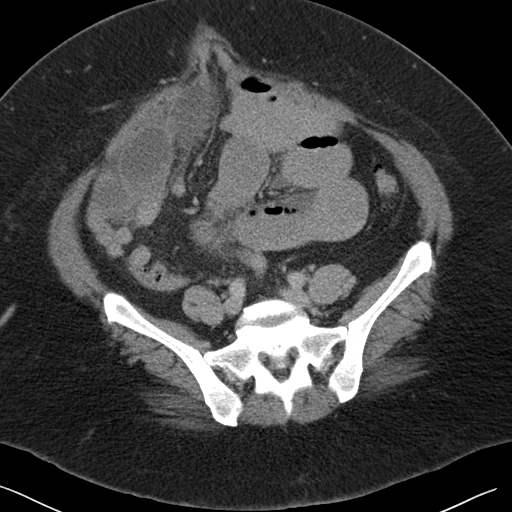
[im 41/82  soft-tissue]
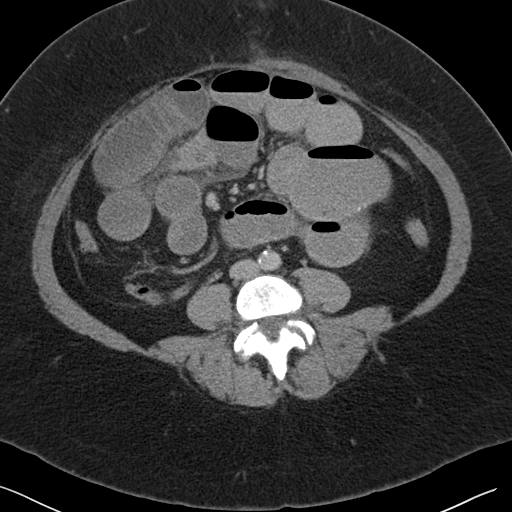
[im 50/82  soft-tissue]
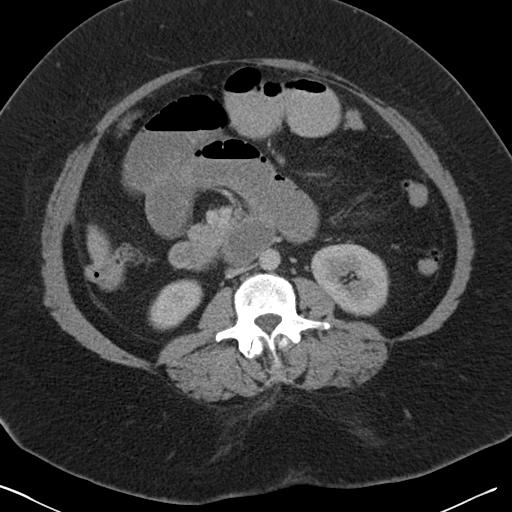
[im 55/82  soft-tissue]
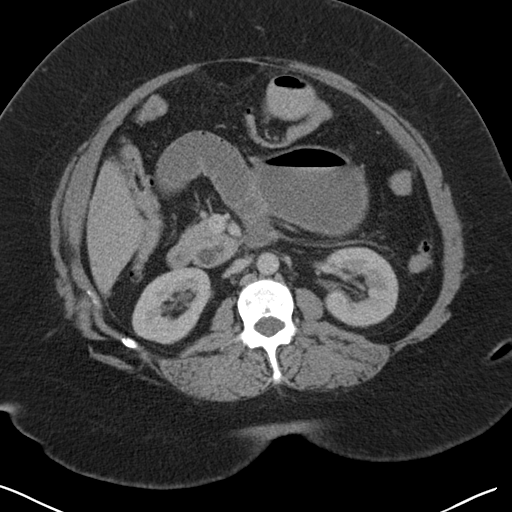
[im 64/82  soft-tissue]
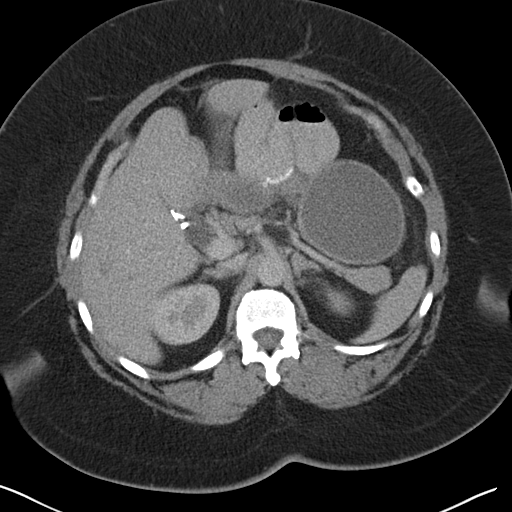
[im 64/82  lung]
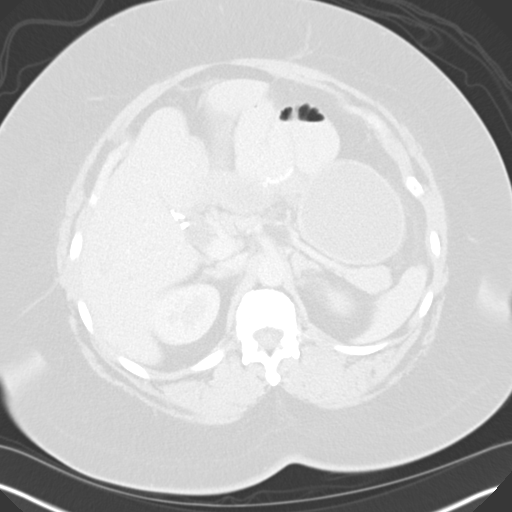
[im 64/82  bone]
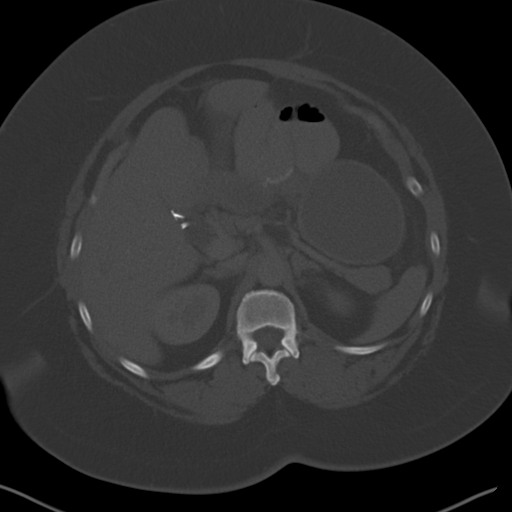
[im 68/82  soft-tissue]
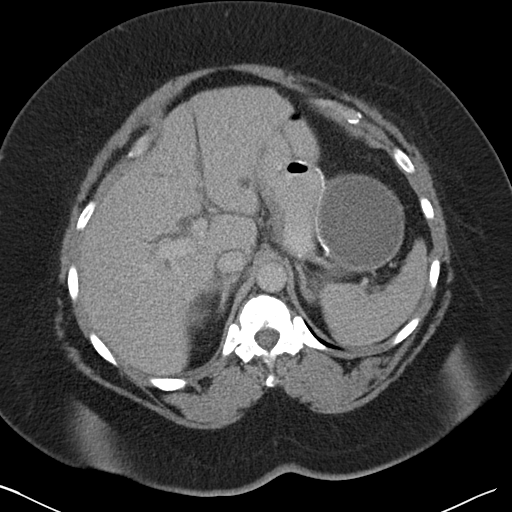
[im 68/82  lung]
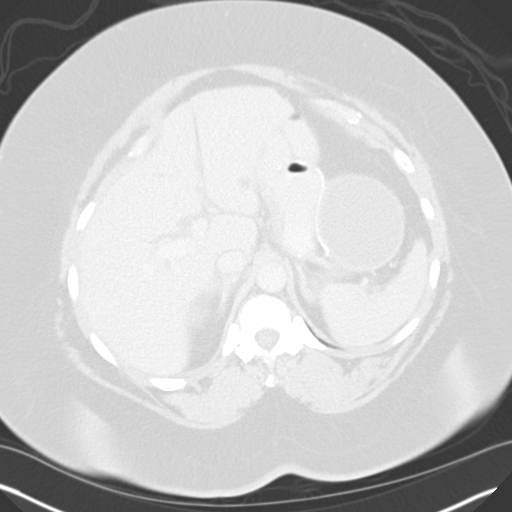
[im 73/82  lung]
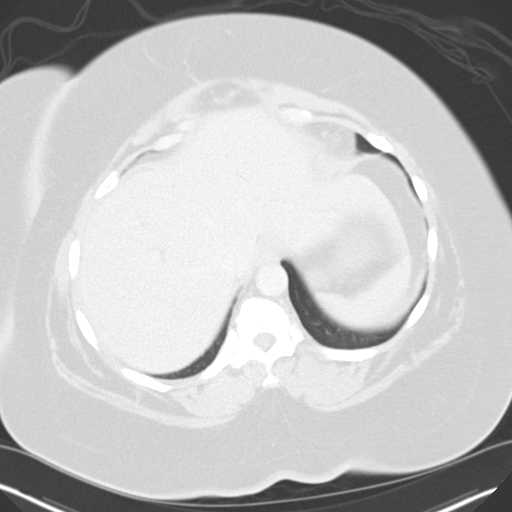
[im 77/82  soft-tissue]
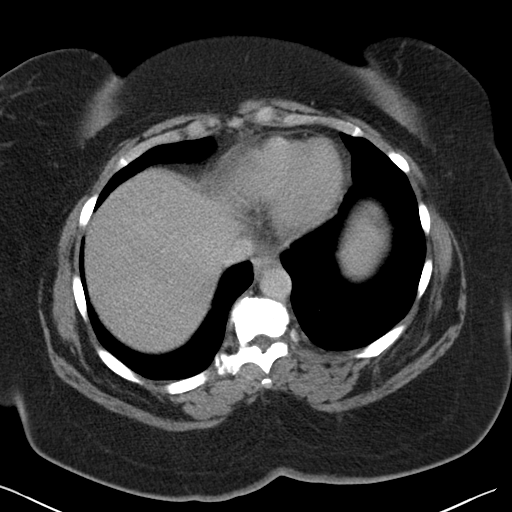
[im 77/82  lung]
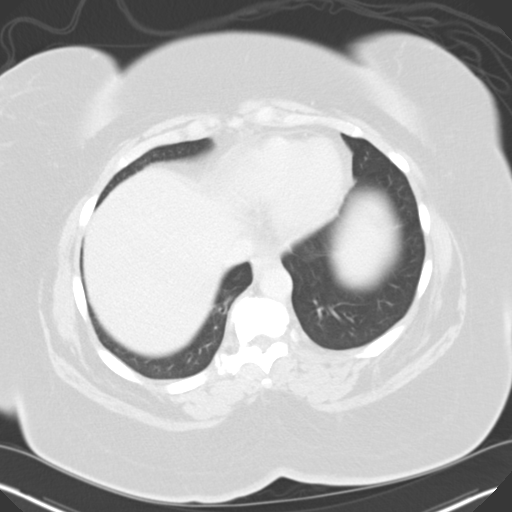

[12 of 32 positions shown; findings below may reference images not displayed]

FINDINGS: The visualized lung bases are clear.

There is diffuse prominence of the intrahepatic biliary ducts.  The
common hepatic duct measures 1.8 cm in diameter; this is unusually
prominent status post cholecystectomy.  However, there is no
definite evidence of distal obstruction.  Scattered clips are noted
along the gallbladder fossa.  The spleen is unremarkable in
appearance.  The pancreas and adrenal glands are grossly
unremarkable.

Focal scarring is noted at the interpole region of the right
kidney.  The kidneys are otherwise unremarkable in appearance.
There is no evidence of hydronephrosis.  No renal or ureteral
stones are seen.  No significant perinephric stranding is
appreciated.

There is diffuse distension of the proximal small bowel, to the
level of the mid to distal ileum.  The small bowel is diffusely
filled with fluid and air; associated free fluid is noted tracking
about small bowel loops at the right mid abdomen and right lower
quadrant, and there is mesenteric inflammation noted at the level
of the pelvis.

A focal transition point is noted within an anterior abdominal wall
hernia at the level of the umbilicus.  A small amount of fluid is
noted in the short segment of bowel within the hernia, with diffuse
associated soft tissue inflammation; more distal small bowel is
entirely decompressed.  Findings are compatible with high-grade
small bowel obstruction.  There is no evidence of bowel perforation
at this time.

 Multiple bowel suture lines are noted within the small bowel, and
along the lesser curvature of the stomach.  The stomach is filled
with fluid and otherwise grossly unremarkable.  No acute vascular
abnormalities are seen.  Mild scattered calcification is noted
along the abdominal aorta and its branches.

The appendix is normal in caliber, without evidence for
appendicitis.  The colon is diffusely decompressed.  Scattered
diverticulosis is noted along the entirety of the colon, without
evidence of diverticulitis.

The bladder is decompressed and not well assessed.  A small to
moderate amount of free fluid is noted within the pelvis; this
likely reflects the small bowel process described above.  The
patient is status post hysterectomy; a residual calcified 4.8 cm
fibroid is noted at the right hemipelvis.  No suspicious adnexal
masses are seen.  No inguinal lymphadenopathy is seen.  Minimal
scattered air is noted within the patient's pannus, likely
postoperative in nature.

No acute osseous abnormalities are identified.  Subcortical cystic
change is seen at both acetabula.
IMPRESSION: 1.  High-grade small bowel obstruction, with a focal transition
point in the anterior abdominal wall hernia at the level of the
umbilicus.  Soft tissue inflammation noted at the umbilical hernia
with an associated incarcerated mid to distal ileal loop;
decompression of the more distal small bowel and the colon.
2.  Soft tissue inflammation and fluid noted tracking about
multiple small bowel loops in the lower abdomen; small to moderate
amount of free fluid noted in the pelvis.  Mesenteric inflammation
noted in the pelvis.  No evidence of perforation.
3.  Dilatation of the common hepatic duct to 1.8 cm in diameter;
diffuse prominence of the intrahepatic biliary ducts.  This is more
than expected status post cholecystectomy, but likely within normal
limits.
4.  Focal right renal scarring noted.
5.  Calcified 4.8 cm fibroid noted at the right hemipelvis.
6.  Mild degenerative change at both hip joints.

Findings were discussed with Dr. TU at [DATE] a.m. on
[DATE].

## 2011-04-11 SURGERY — LAPAROTOMY, EXPLORATORY
Anesthesia: General

## 2011-04-11 SURGERY — LAPAROTOMY, EXPLORATORY
Anesthesia: General | Site: Abdomen | Wound class: Clean

## 2011-04-11 MED ORDER — SUCCINYLCHOLINE CHLORIDE 20 MG/ML IJ SOLN
INTRAMUSCULAR | Status: DC | PRN
  Administered 2011-04-11: 100 mg via INTRAVENOUS

## 2011-04-11 MED ORDER — SODIUM CHLORIDE 0.9 % IV SOLN
12.5000 mg | Freq: Once | INTRAVENOUS | Status: DC
  Administered 2011-04-11: 12.5 mg via INTRAVENOUS

## 2011-04-11 MED ORDER — ONDANSETRON HCL 4 MG/2ML IJ SOLN
4.0000 mg | Freq: Once | INTRAMUSCULAR | Status: AC
  Administered 2011-04-11: 4 mg via INTRAVENOUS
  Filled 2011-04-11: qty 2

## 2011-04-11 MED ORDER — ACETAMINOPHEN 10 MG/ML IV SOLN
INTRAVENOUS | Status: AC
  Filled 2011-04-11: qty 100

## 2011-04-11 MED ORDER — LACTATED RINGERS IV SOLN
INTRAVENOUS | Status: DC | PRN
  Administered 2011-04-11 (×2): via INTRAVENOUS

## 2011-04-11 MED ORDER — ACETAMINOPHEN 10 MG/ML IV SOLN
INTRAVENOUS | Status: DC | PRN
  Administered 2011-04-11: 1000 mg via INTRAVENOUS

## 2011-04-11 MED ORDER — LACTATED RINGERS IV SOLN: INTRAVENOUS | Status: DC

## 2011-04-11 MED ORDER — MIDAZOLAM HCL 5 MG/5ML IJ SOLN
INTRAMUSCULAR | Status: DC | PRN
  Administered 2011-04-11: 1 mg via INTRAVENOUS

## 2011-04-11 MED ORDER — LIDOCAINE HCL (CARDIAC) 20 MG/ML IV SOLN
INTRAVENOUS | Status: DC | PRN
  Administered 2011-04-11: 100 mg via INTRAVENOUS

## 2011-04-11 MED ORDER — CISATRACURIUM BESYLATE 2 MG/ML IV SOLN
INTRAVENOUS | Status: DC | PRN
  Administered 2011-04-11: 8 mg via INTRAVENOUS

## 2011-04-11 MED ORDER — ONDANSETRON HCL 4 MG PO TABS: 4.0000 mg | ORAL_TABLET | Freq: Four times a day (QID) | ORAL | Status: DC | PRN

## 2011-04-11 MED ORDER — CEFAZOLIN SODIUM-DEXTROSE 2-3 GM-% IV SOLR
INTRAVENOUS | Status: AC
  Filled 2011-04-11: qty 50

## 2011-04-11 MED ORDER — SODIUM CHLORIDE 0.9 % IJ SOLN: 9.0000 mL | INTRAMUSCULAR | Status: DC | PRN

## 2011-04-11 MED ORDER — IOHEXOL 300 MG/ML  SOLN
100.0000 mL | Freq: Once | INTRAMUSCULAR | Status: AC | PRN
  Administered 2011-04-11: 100 mL via INTRAVENOUS

## 2011-04-11 MED ORDER — PANTOPRAZOLE SODIUM 40 MG IV SOLR
40.0000 mg | Freq: Two times a day (BID) | INTRAVENOUS | Status: DC
  Administered 2011-04-11 – 2011-04-14 (×7): 40 mg via INTRAVENOUS
  Filled 2011-04-11 (×8): qty 40

## 2011-04-11 MED ORDER — 0.9 % SODIUM CHLORIDE (POUR BTL) OPTIME
TOPICAL | Status: DC | PRN
  Administered 2011-04-11: 1000 mL

## 2011-04-11 MED ORDER — NEOSTIGMINE METHYLSULFATE 1 MG/ML IJ SOLN
INTRAMUSCULAR | Status: DC | PRN
  Administered 2011-04-11: 5 mg via INTRAVENOUS

## 2011-04-11 MED ORDER — ESMOLOL HCL 10 MG/ML IV SOLN
INTRAVENOUS | Status: DC | PRN
  Administered 2011-04-11: 30 mg via INTRAVENOUS

## 2011-04-11 MED ORDER — DIPHENHYDRAMINE HCL 12.5 MG/5ML PO ELIX: 12.5000 mg | ORAL_SOLUTION | Freq: Four times a day (QID) | ORAL | Status: DC | PRN

## 2011-04-11 MED ORDER — ONDANSETRON HCL 4 MG/2ML IJ SOLN
4.0000 mg | Freq: Four times a day (QID) | INTRAMUSCULAR | Status: DC | PRN
  Filled 2011-04-11: qty 2

## 2011-04-11 MED ORDER — MORPHINE SULFATE 2 MG/ML IJ SOLN
2.0000 mg | INTRAMUSCULAR | Status: DC | PRN
  Administered 2011-04-11 (×2): 2 mg via INTRAVENOUS
  Filled 2011-04-11 (×2): qty 1

## 2011-04-11 MED ORDER — PROPOFOL 10 MG/ML IV EMUL
INTRAVENOUS | Status: DC | PRN
  Administered 2011-04-11: 200 mg via INTRAVENOUS

## 2011-04-11 MED ORDER — HEPARIN SODIUM (PORCINE) 5000 UNIT/ML IJ SOLN
5000.0000 [IU] | Freq: Three times a day (TID) | INTRAMUSCULAR | Status: DC
  Filled 2011-04-11 (×4): qty 1

## 2011-04-11 MED ORDER — KETOROLAC TROMETHAMINE 15 MG/ML IJ SOLN
INTRAMUSCULAR | Status: AC
  Filled 2011-04-11: qty 1

## 2011-04-11 MED ORDER — PROMETHAZINE HCL 25 MG/ML IJ SOLN
INTRAMUSCULAR | Status: AC
  Filled 2011-04-11: qty 1

## 2011-04-11 MED ORDER — KETOROLAC TROMETHAMINE 15 MG/ML IJ SOLN
15.0000 mg | Freq: Four times a day (QID) | INTRAMUSCULAR | Status: DC | PRN
  Administered 2011-04-11: 15 mg via INTRAVENOUS

## 2011-04-11 MED ORDER — SODIUM CHLORIDE 0.9 % IV SOLN
1.0000 g | INTRAVENOUS | Status: DC
  Filled 2011-04-11: qty 1

## 2011-04-11 MED ORDER — MORPHINE SULFATE 4 MG/ML IJ SOLN
4.0000 mg | Freq: Once | INTRAMUSCULAR | Status: AC
  Administered 2011-04-11: 4 mg via INTRAVENOUS
  Filled 2011-04-11: qty 1

## 2011-04-11 MED ORDER — ONDANSETRON HCL 4 MG/2ML IJ SOLN: 4.0000 mg | Freq: Four times a day (QID) | INTRAMUSCULAR | Status: DC | PRN

## 2011-04-11 MED ORDER — PROMETHAZINE HCL 25 MG/ML IJ SOLN: 6.2500 mg | INTRAMUSCULAR | Status: DC | PRN

## 2011-04-11 MED ORDER — PROMETHAZINE HCL 25 MG/ML IJ SOLN
12.5000 mg | Freq: Once | INTRAMUSCULAR | Status: AC
  Administered 2011-04-11: 12.5 mg via INTRAVENOUS

## 2011-04-11 MED ORDER — KCL IN DEXTROSE-NACL 20-5-0.45 MEQ/L-%-% IV SOLN
INTRAVENOUS | Status: AC
  Administered 2011-04-11: 1000 mL via INTRAVENOUS
  Filled 2011-04-11: qty 1000

## 2011-04-11 MED ORDER — SODIUM CHLORIDE 0.9 % IV BOLUS (SEPSIS)
500.0000 mL | Freq: Once | INTRAVENOUS | Status: AC
  Administered 2011-04-11: 500 mL via INTRAVENOUS

## 2011-04-11 MED ORDER — SODIUM CHLORIDE 0.9 % IV SOLN: 12.5000 mg | Freq: Once | INTRAVENOUS | Status: DC

## 2011-04-11 MED ORDER — SODIUM CHLORIDE 0.9 % IV SOLN
INTRAVENOUS | Status: DC
  Administered 2011-04-11: 02:00:00 via INTRAVENOUS

## 2011-04-11 MED ORDER — CEFAZOLIN SODIUM-DEXTROSE 2-3 GM-% IV SOLR
2.0000 g | INTRAVENOUS | Status: AC
  Administered 2011-04-11: 2 g via INTRAVENOUS

## 2011-04-11 MED ORDER — FENTANYL CITRATE 0.05 MG/ML IJ SOLN
25.0000 ug | INTRAMUSCULAR | Status: DC | PRN
  Administered 2011-04-11 (×2): 25 ug via INTRAVENOUS

## 2011-04-11 MED ORDER — FENTANYL CITRATE 0.05 MG/ML IJ SOLN
INTRAMUSCULAR | Status: AC
  Administered 2011-04-11: 25 ug via INTRAVENOUS
  Filled 2011-04-11: qty 2

## 2011-04-11 MED ORDER — ONDANSETRON HCL 4 MG/2ML IJ SOLN
INTRAMUSCULAR | Status: DC | PRN
  Administered 2011-04-11: 4 mg via INTRAVENOUS

## 2011-04-11 MED ORDER — CHLORHEXIDINE GLUCONATE 4 % EX LIQD
1.0000 "application " | Freq: Once | CUTANEOUS | Status: DC
  Filled 2011-04-11: qty 15

## 2011-04-11 MED ORDER — FENTANYL CITRATE 0.05 MG/ML IJ SOLN
INTRAMUSCULAR | Status: DC | PRN
  Administered 2011-04-11: 50 ug via INTRAVENOUS
  Administered 2011-04-11: 150 ug via INTRAVENOUS
  Administered 2011-04-11: 100 ug via INTRAVENOUS

## 2011-04-11 MED ORDER — KCL IN DEXTROSE-NACL 20-5-0.45 MEQ/L-%-% IV SOLN
INTRAVENOUS | Status: DC
  Administered 2011-04-11: 1000 mL via INTRAVENOUS
  Administered 2011-04-12: 10:00:00 via INTRAVENOUS
  Administered 2011-04-12: 100 mL via INTRAVENOUS
  Administered 2011-04-13 – 2011-04-14 (×3): via INTRAVENOUS
  Filled 2011-04-11 (×10): qty 1000

## 2011-04-11 MED ORDER — HYDROMORPHONE 0.3 MG/ML IV SOLN
INTRAVENOUS | Status: DC
  Administered 2011-04-11 (×2): 0.3 mg via INTRAVENOUS
  Administered 2011-04-11: 15:00:00 via INTRAVENOUS
  Administered 2011-04-12 (×3): 0.3 mg via INTRAVENOUS
  Administered 2011-04-12 – 2011-04-13 (×4): 0.6 mg via INTRAVENOUS
  Administered 2011-04-13: 0.3 mg via INTRAVENOUS
  Administered 2011-04-13: 15:00:00 via INTRAVENOUS
  Administered 2011-04-14 (×2): 0.3 mg via INTRAVENOUS
  Filled 2011-04-11 (×2): qty 25

## 2011-04-11 MED ORDER — ENOXAPARIN SODIUM 40 MG/0.4ML ~~LOC~~ SOLN
40.0000 mg | SUBCUTANEOUS | Status: DC
  Administered 2011-04-12 – 2011-04-14 (×3): 40 mg via SUBCUTANEOUS
  Filled 2011-04-11 (×4): qty 0.4

## 2011-04-11 MED ORDER — ONDANSETRON HCL 4 MG/2ML IJ SOLN
4.0000 mg | Freq: Once | INTRAMUSCULAR | Status: AC
Start: 2011-04-11 — End: 2011-04-11
  Administered 2011-04-11: 4 mg via INTRAVENOUS
  Filled 2011-04-11: qty 2

## 2011-04-11 MED ORDER — POTASSIUM CHLORIDE IN NACL 20-0.9 MEQ/L-% IV SOLN
INTRAVENOUS | Status: DC
  Administered 2011-04-11: 08:00:00 via INTRAVENOUS
  Filled 2011-04-11 (×5): qty 1000

## 2011-04-11 MED ORDER — DIPHENHYDRAMINE HCL 50 MG/ML IJ SOLN: 12.5000 mg | Freq: Four times a day (QID) | INTRAMUSCULAR | Status: DC | PRN

## 2011-04-11 MED ORDER — GLYCOPYRROLATE 0.2 MG/ML IJ SOLN
INTRAMUSCULAR | Status: DC | PRN
  Administered 2011-04-11: .8 mg via INTRAVENOUS

## 2011-04-11 MED ORDER — NALOXONE HCL 0.4 MG/ML IJ SOLN: 0.4000 mg | INTRAMUSCULAR | Status: DC | PRN

## 2011-04-11 SURGICAL SUPPLY — 41 items
APPLICATOR COTTON TIP 6IN STRL (MISCELLANEOUS) ×1 IMPLANT
BINDER ABD UNIV 9 30-45 (GAUZE/BANDAGES/DRESSINGS) IMPLANT
BINDER ABDOMINAL 9 (GAUZE/BANDAGES/DRESSINGS) ×2
BLADE EXTENDED COATED 6.5IN (ELECTRODE) IMPLANT
BLADE HEX COATED 2.75 (ELECTRODE) ×2 IMPLANT
CANISTER SUCTION 2500CC (MISCELLANEOUS) ×2 IMPLANT
CLOTH BEACON ORANGE TIMEOUT ST (SAFETY) ×2 IMPLANT
COVER MAYO STAND STRL (DRAPES) IMPLANT
DRAPE LAPAROSCOPIC ABDOMINAL (DRAPES) ×2 IMPLANT
DRAPE WARM FLUID 44X44 (DRAPE) IMPLANT
ELECT REM PT RETURN 9FT ADLT (ELECTROSURGICAL) ×2
ELECTRODE REM PT RTRN 9FT ADLT (ELECTROSURGICAL) ×1 IMPLANT
GLOVE BIOGEL PI IND STRL 7.0 (GLOVE) ×1 IMPLANT
GLOVE BIOGEL PI INDICATOR 7.0 (GLOVE)
GLOVE INDICATOR 8.0 STRL GRN (GLOVE) ×4 IMPLANT
GLOVE SS BIOGEL STRL SZ 8 (GLOVE) ×1 IMPLANT
GLOVE SUPERSENSE BIOGEL SZ 8 (GLOVE) ×1
GOWN STRL NON-REIN LRG LVL3 (GOWN DISPOSABLE) ×2 IMPLANT
GOWN STRL REIN XL XLG (GOWN DISPOSABLE) ×4 IMPLANT
KIT BASIN OR (CUSTOM PROCEDURE TRAY) ×2 IMPLANT
NS IRRIG 1000ML POUR BTL (IV SOLUTION) ×2 IMPLANT
PACK GENERAL/GYN (CUSTOM PROCEDURE TRAY) ×2 IMPLANT
SPONGE GAUZE 4X4 12PLY (GAUZE/BANDAGES/DRESSINGS) ×2 IMPLANT
SPONGE LAP 18X18 X RAY DECT (DISPOSABLE) IMPLANT
STAPLER VISISTAT 35W (STAPLE) ×2 IMPLANT
SUCTION POOLE TIP (SUCTIONS) IMPLANT
SUT NOVA 1 T20/GS 25DT (SUTURE) ×2 IMPLANT
SUT NOVA NAB DX-16 0-1 5-0 T12 (SUTURE) ×1 IMPLANT
SUT PDS AB 1 CTX 36 (SUTURE) IMPLANT
SUT SILK 2 0 (SUTURE)
SUT SILK 2-0 18XBRD TIE 12 (SUTURE) IMPLANT
SUT SILK 3 0 (SUTURE)
SUT SILK 3 0 SH CR/8 (SUTURE) IMPLANT
SUT SILK 3-0 18XBRD TIE 12 (SUTURE) IMPLANT
SUT VIC AB 0 CT1 36 (SUTURE) ×1 IMPLANT
SUT VIC AB 2-0 SH 18 (SUTURE) IMPLANT
SUT VIC AB 3-0 SH 18 (SUTURE) IMPLANT
TAPE HYPAFIX 6X30 (GAUZE/BANDAGES/DRESSINGS) ×1 IMPLANT
TOWEL OR 17X26 10 PK STRL BLUE (TOWEL DISPOSABLE) ×4 IMPLANT
TRAY FOLEY CATH 14FRSI W/METER (CATHETERS) ×1 IMPLANT
YANKAUER SUCT BULB TIP NO VENT (SUCTIONS) IMPLANT

## 2011-04-11 NOTE — Anesthesia Procedure Notes (Signed)
Procedure Name: Intubation Date/Time: 04/11/2011 12:47 PM Performed by: Lurlean Leyden, Debbe Crumble L. Patient Re-evaluated:Patient Re-evaluated prior to inductionOxygen Delivery Method: Circle System Utilized Preoxygenation: Pre-oxygenation with 100% oxygen Intubation Type: IV induction, Rapid sequence and Cricoid Pressure applied Laryngoscope Size: Miller and 2 Grade View: Grade I Tube type: Oral Tube size: 7.5 mm Number of attempts: 1 Airway Equipment and Method: stylet Placement Confirmation: ETT inserted through vocal cords under direct vision,  breath sounds checked- equal and bilateral and positive ETCO2 Secured at: 20 cm Tube secured with: Tape Dental Injury: Teeth and Oropharynx as per pre-operative assessment

## 2011-04-11 NOTE — ED Notes (Signed)
Patient is resting comfortably. States pain is 4/10 from moving for radiology. Denies Nausea, Instructed to call if pain progresses.

## 2011-04-11 NOTE — ED Notes (Signed)
Pt states that she had a total hysterectomy on Tuesday of last week.

## 2011-04-11 NOTE — ED Notes (Signed)
IV started. Denise [name] and [name] sent to lab. Limited blood flash back.

## 2011-04-11 NOTE — ED Notes (Signed)
MD at bedside,  Surgeon Dr. Derrell Lolling.

## 2011-04-11 NOTE — Consult Note (Signed)
Pt seen and examined.  Probalble SBO secondary to port site hernia. Recommend exploratory laparotomy.  Pt agrees.  The procedure has been discussed with the patient. The risks,  benefits and alternatives to surgery have been discussed. The patient understands the reason for the procedure and agrees to proceed. All questions are answered today to the best of my ability.Othe treatments discussed.  Risks of bleeding ,  Infection,  Bowel injury,  Bowel resection,  DVT,  Death.

## 2011-04-11 NOTE — Anesthesia Postprocedure Evaluation (Signed)
Anesthesia Post Note  Patient: Denise [name]  Procedure(s) Performed:  EXPLORATORY LAPAROTOMY - closure port hole  Anesthesia type: General  Patient location: PACU  Post pain: Pain level controlled  Post assessment: Post-op Vital signs reviewed  Last Vitals:  Filed Vitals:   04/11/11 1358  BP:   Pulse: 102  Temp: 37.7 C  Resp: 19    Post vital signs: Reviewed  Level of consciousness: sedated  Complications: No apparent anesthesia complications

## 2011-04-11 NOTE — ED Notes (Signed)
Patient transported to X-ray 

## 2011-04-11 NOTE — ED Notes (Signed)
Pt returned from CT. NAD.

## 2011-04-11 NOTE — ED Notes (Signed)
Patient is resting comfortably. Awaiting to be informed of CT scan results from provider.

## 2011-04-11 NOTE — ED Notes (Signed)
Pt states she started having abd pain on Sunday after eating that afternoon  States Monday she started vomiting and has not been able to hold anything down since

## 2011-04-11 NOTE — Anesthesia Preprocedure Evaluation (Signed)
Anesthesia Evaluation  Patient identified by MRN, date of birth, ID band Patient awake    Reviewed: Allergy & Precautions, H&P , NPO status , Patient's Chart, lab work & pertinent test results  Airway Mallampati: II TM Distance: >3 FB     Dental  (+) Edentulous Upper, Edentulous Lower and Dental Advisory Given   Pulmonary neg pulmonary ROS,  clear to auscultation  Pulmonary exam normal       Cardiovascular Exercise Tolerance: Poor hypertension, Pt. on medications Regular Normal    Neuro/Psych Chronic low back pain; on steroid dose pac in recent past. Negative Neurological ROS  Negative Psych ROS   GI/Hepatic negative GI ROS, Neg liver ROS,   Endo/Other  Negative Endocrine ROSDiabetes mellitus-, Type 2, Oral Hypoglycemic AgentsMorbid obesity (Prior gastric by-pass with 90 lb weight loss)  Renal/GU negative Renal ROS  Genitourinary negative   Musculoskeletal negative musculoskeletal ROS (+)   Abdominal (+) obese,   Peds negative pediatric ROS (+)  Hematology negative hematology ROS (+)   Anesthesia Other Findings   Reproductive/Obstetrics negative OB ROS                           Anesthesia Physical Anesthesia Plan  ASA: III and Emergent  Anesthesia Plan: General   Post-op Pain Management:    Induction: Intravenous, Cricoid pressure planned and Rapid sequence  Airway Management Planned: Oral ETT  Additional Equipment:   Intra-op Plan:   Post-operative Plan: Extubation in OR  Informed Consent: I have reviewed the patients History and Physical, chart, labs and discussed the procedure including the risks, benefits and alternatives for the proposed anesthesia with the patient or authorized representative who has indicated his/her understanding and acceptance.   Dental advisory given  Plan Discussed with: CRNA  Anesthesia Plan Comments: (With prior history of gastric bypass will  avoid oral/nasal gastric tube at this time. Steroid bolus intra-op.)        Anesthesia Quick Evaluation

## 2011-04-11 NOTE — ED Provider Notes (Signed)
Medical screening examination/treatment/procedure(s) were performed by non-physician practitioner and as supervising physician I was immediately available for consultation/collaboration.  Jaleesa Cervi K Linker, MD 04/11/11 0732 

## 2011-04-11 NOTE — Preoperative (Signed)
Beta Blockers   Reason not to administer Beta Blockers:Not Applicable 

## 2011-04-11 NOTE — ED Provider Notes (Signed)
History     CSN: [id number]  Arrival date & time 04/11/11  0004   First MD Initiated Contact with Patient 04/11/11 0050      Chief Complaint  Patient presents with  . Emesis  . Abdominal Pain    HPI: Patient is a 61 y.o. female presenting with vomiting and abdominal pain.  Emesis  There has been no fever. Associated symptoms include abdominal pain. Pertinent negatives include no fever.  Abdominal Pain The primary symptoms of the illness include abdominal pain and vomiting. The primary symptoms of the illness do not include fever or dysuria.  Pt reports onset of abd pain and persistent N/V Sunday. Reports > 10 episodes of vomiting in last 24 hours. Emesis is dark green bilious and foul smelling. Has had one small loose stool yesterday am, but none since. Denies fever, CP, SOB or other sx's. Pt reports she had a bil salpingo-oopherectomy on 04/04/2011 s/p dx of ovarian cancer. Past Medical History  Diagnosis Date  . HTN (hypertension)   . Anemia   . HTN (hypertension)   . Arthritis   . Diabetes mellitus     diet controlled    Past Surgical History  Procedure Date  . Gallbladder surgery   . Cholecystectomy 1997  . Gastric bypass   . Laparoscopic salpingoopherectomy     Family History  Problem Relation Age of Onset  . Arthritis    . Cancer    . Breast cancer Mother   . Hypertension Father   . Diabetes Father     History  Substance Use Topics  . Smoking status: Former Smoker -- 1.0 packs/day for 12 years    Types: Cigarettes    Quit date: 03/20/1976  . Smokeless tobacco: Not on file  . Alcohol Use: No    OB History    Grav Para Term Preterm Abortions TAB SAB Ect Mult Living                  Review of Systems  Constitutional: Negative.  Negative for fever.  HENT: Negative.   Eyes: Negative.   Respiratory: Negative.   Cardiovascular: Negative.   Gastrointestinal: Positive for vomiting and abdominal pain.  Genitourinary: Negative.  Negative for dysuria.    Musculoskeletal: Negative.   Skin: Negative.   Neurological: Negative.   Hematological: Negative.   Psychiatric/Behavioral: Negative.     Allergies  Review of patient's allergies indicates no known allergies.  Home Medications   Current Outpatient Rx  Name Route Sig Dispense Refill  . AMLODIPINE BESYLATE 5 MG PO TABS Oral Take 5 mg by mouth daily after breakfast.     . ASPIRIN 81 MG PO TABS Oral Take 81 mg by mouth daily before breakfast.     . CALCIUM 600 PO Oral Take 1 tablet by mouth at bedtime.    . TUMS PO Oral Take 4 tablets by mouth 2 (two) times daily.     Marland Kitchen VITAMIN B-12 2500 MCG SL SUBL Sublingual Place 1 tablet under the tongue daily.    Marland Kitchen FERROUS SULFATE 325 (65 FE) MG PO TABS Oral Take 325 mg by mouth daily with breakfast.    . GABAPENTIN 100 MG PO CAPS Oral Take 100 mg by mouth 2 (two) times daily.     Marland Kitchen GABAPENTIN 300 MG PO CAPS Oral Take 300 mg by mouth at bedtime.    . ADULT MULTIVITAMIN W/MINERALS CH Oral Take 1 tablet by mouth daily before breakfast. Centrum silver    . OXYCODONE-ACETAMINOPHEN  5-325 MG PO TABS Oral Take 2 tablets by mouth every 6 (six) hours as needed for pain. 30 tablet 0    BP 149/78  Pulse 100  Temp(Src) 99 F (37.2 C) (Oral)  Resp 20  SpO2 98%  Physical Exam  Constitutional: She is oriented to person, place, and time. She appears well-developed and well-nourished.  HENT:  Head: Normocephalic and atraumatic.  Eyes: Conjunctivae are normal.  Neck: Neck supple.  Cardiovascular: Normal rate and regular rhythm.   Pulmonary/Chest: Effort normal and breath sounds normal.  Abdominal: Soft. Bowel sounds are normal.       Peri-umbilical TTP. Steri-strips remain in place to umbilicus and (L) and (R) mid abd. Abd distended but remains relatively soft w/ hypoactive BS  Musculoskeletal: Normal range of motion.  Neurological: She is alert and oriented to person, place, and time.  Skin: Skin is warm and dry. No erythema.  Psychiatric: She has a  normal mood and affect.    ED Course  Procedures Patient reports significant relief of pain w/ IV medication, and nausea much improved. Reports past history of Roux-en-Y gastric bypass procedure in 2008. Will hold on NG tube placement until  after scanning. Pt agreeable w/ plan.  Findings and clinical impression discussed with patient. I have discussed patient with Dr. Karma Ganja. Will consult General surgery for plan.  35: I have discussed patient with Dr. Derrell Lolling who has agreed to come see patient in the emergency department.    Labs Reviewed  GLUCOSE, CAPILLARY - Abnormal; Notable for the following:    Glucose-Capillary 158 (*)    All other components within normal limits  CBC - Abnormal; Notable for the following:    RBC 5.22 (*)    All other components within normal limits  COMPREHENSIVE METABOLIC PANEL - Abnormal; Notable for the following:    Glucose, Bld 161 (*)    All other components within normal limits  DIFFERENTIAL  LIPASE, BLOOD  POCT CBG MONITORING  URINALYSIS, ROUTINE W REFLEX MICROSCOPIC   Dg Abd Acute W/chest  04/11/2011  *RADIOLOGY REPORT*  Clinical Data: Nausea and lower abdominal pain for 72 hours.  ACUTE ABDOMEN SERIES (ABDOMEN 2 VIEW & CHEST 1 VIEW)  Comparison: Abdominal radiograph performed 11/27/2006, and CT of the abdomen and pelvis performed 02/13/2006; chest radiograph performed 03/31/2011  Findings: The lungs are well-aerated and clear.  There is no evidence of focal opacification, pleural effusion or pneumothorax. The cardiomediastinal silhouette is within normal limits.  There is diffuse distension of small bowel loops to 4.4 cm in maximal diameter, with associated air-fluid levels, concerning for at least some degree of bowel obstruction.  The colon appears relatively decompressed.  No free intra-abdominal air is identified on the provided upright view.  Clips are noted within the right upper quadrant, reflecting prior cholecystectomy.  No acute osseous  abnormalities are seen; the sacroiliac joints are unremarkable in appearance.  A calcified uterine fibroid is again noted.  IMPRESSION:  1.  Diffuse distension of small bowel loops to 4.4 cm in maximal diameter, with associated air-fluid levels, concerning for at least some degree of bowel obstruction.  No free intra-abdominal air seen. 2.  No acute cardiopulmonary process identified. 3.  Calcified uterine fibroid again seen.  Findings were discussed with Tama Gander at 01:56 a.m. on 04/11/2011.  Original Report Authenticated By: Tonia Ghent, M.D.     No diagnosis found.    MDM  HPI/PE and clinical findings c/w SBO with incarcerated mid to distal ileal loop. NG held  due to past gastric bypass procedure. Vomiting improved w/ medications. Surgery to see in ED        Leanne Chang, NP 04/11/11 [id number]

## 2011-04-11 NOTE — H&P (Signed)
Chief Complaint  Patient presents with  . Emesis  . Abdominal Pain    HISTORY:   This is a 61 year old African American female who underwent a Roux-en-Y gastric bypass for morbid obesity Dr. Ovidio Kin on 11/26/2006. She recently underwent robotic-assisted bilateral salpingo-nephrectomy on 04/04/2011 Dr. Murvin Natal. Pathology is benign. She went home uneventfully.  48 hours ago she had some gas cramps in her abdomen. 24 hours ago she began having nausea and bilious vomiting and centralized abdominal pain.Marland Kitchen Her last good bowel movement was 48 hours ago. She has had markedly decreased flatus.  She came to [place] on emergency room. She was found to have mildly tender abdomen. CT scan shows an incarcerated loop of small bowel as a transition point for a mechanical small bowel obstruction. She also has a dilated common bile duct, but to be due to her prior laparoscopic cholecystectomy. She she is being admitted for management of her incarcerated ventral incisional hernia and associated small bowel obstruction.   Past Medical History  Diagnosis Date  . HTN (hypertension)   . Anemia   . HTN (hypertension)   . Arthritis   . Diabetes mellitus     diet controlled     Current Facility-Administered Medications  Medication Dose Route Frequency Provider Last Rate Last Dose  . 0.9 %  sodium chloride infusion   Intravenous Continuous Roma Kayser Schorr, NP 125 mL/hr at 04/11/11 0147    . iohexol (OMNIPAQUE) 300 MG/ML solution 100 mL  100 mL Intravenous Once PRN Ethelda Chick, MD   100 mL at 04/11/11 0423  . morphine 4 MG/ML injection 4 mg  4 mg Intravenous Once Leanne Chang, NP   4 mg at 04/11/11 0148  . ondansetron (ZOFRAN) injection 4 mg  4 mg Intravenous Once Leanne Chang, NP   4 mg at 04/11/11 0147  . ondansetron (ZOFRAN) injection 4 mg  4 mg Intravenous Once Leanne Chang, NP   4 mg at 04/11/11 0239  . sodium chloride 0.9 % bolus 500 mL  500 mL Intravenous Once Leanne Chang, NP   500 mL at 04/11/11 0147   Current Outpatient Prescriptions  Medication Sig Dispense Refill  . amLODipine (NORVASC) 5 MG tablet Take 5 mg by mouth daily after breakfast.       . aspirin 81 MG tablet Take 81 mg by mouth daily before breakfast.       . Calcium Carbonate (CALCIUM 600 PO) Take 1 tablet by mouth at bedtime.      . Calcium Carbonate Antacid (TUMS PO) Take 4 tablets by mouth 2 (two) times daily.       . Cyanocobalamin (VITAMIN B-12) 2500 MCG SUBL Place 1 tablet under the tongue daily.      . ferrous sulfate 325 (65 FE) MG tablet Take 325 mg by mouth daily with breakfast.      . gabapentin (NEURONTIN) 100 MG capsule Take 100 mg by mouth 2 (two) times daily.       Marland Kitchen gabapentin (NEURONTIN) 300 MG capsule Take 300 mg by mouth at bedtime.      . Multiple Vitamin (MULITIVITAMIN WITH MINERALS) TABS Take 1 tablet by mouth daily before breakfast. Centrum silver      . oxyCODONE-acetaminophen (PERCOCET) 5-325 MG per tablet Take 2 tablets by mouth every 6 (six) hours as needed for pain.  30 tablet  0     No Known Allergies   Family History  Problem Relation Age of Onset  .  Arthritis    . Cancer    . Breast cancer Mother   . Hypertension Father   . Diabetes Father      History   Social History  . Marital Status: Single    Spouse Name: N/A    Number of Children: N/A  . Years of Education: 12th grade   Occupational History  . unemployed    Social History Main Topics  . Smoking status: Former Smoker -- 1.0 packs/day for 12 years    Types: Cigarettes    Quit date: 03/20/1976  . Smokeless tobacco: None  . Alcohol Use: No  . Drug Use: No  . Sexually Active: None   Other Topics Concern  . None   Social History Narrative  . None     REVIEW OF SYSTEMS - PERTINENT POSITIVES ONLY:    No prior history of hernia or obstruction. Denies scleral icterus, a callus cooled stools, or jaundice. Denies hematemesis. Denies hematochezia. Denies chest pain or shortness of  breath.   EXAM: Filed Vitals:   04/11/11 0010  BP: 149/78  Pulse: 100  Temp: 99 F (37.2 C)  Resp: 20    HEENT: normocephalic; pupils equal and reactive; sclerae clear; dentition good; mucous membranes moist NECK:  symmetric on extension; no palpable anterior or posterior cervical lymphadenopathy; no supraclavicular masses; no tenderness CHEST: clear to auscultation bilaterally without rales, rhonchi, or wheezes CARDIAC: regular rate and rhythm without significant murmur; peripheral pulses are full ABDOMEN: Morbidly obese. Soft with mild distention. Recent laparoscopy scars at the umbilicus and to the right and left as well as left upper quadrant. No sign of infection. Tender mass in the midline above the umbilicus, and not reducible. Skin healthy. Bowel sounds occasional, high-pitched. EXT:  non-tender without edema; no deformity NEURO: no gross focal deficits; no sign of tremor   LABORATORY RESULTS: See [facility] (CHL-Epic) for most recent results   RADIOLOGY RESULTS: See [facility] (CHL-Epic) for most recent results   IMPRESSION:    High-grade mechanical small bowel structures secondary to incarcerated incisional hernia  Morbid obesity  Hypertension  History Roux-en-Y gastric bypass  History laparoscopic cholecystectomy  History recent robotic-assisted bilateral salpingo-oophorectomy for benign disease   PLAN:  The patient will be admitted to the hospital and prepared for surgery.  The patient was advised she would need surgical reduction and repair of her an incarcerated and possibly strangulate ventral incisional hernia.  The patient was advised that her surgery will be most likely be performed by Dr. Angelique Blonder who is the doctor of the week this week.she is agreeable to this.  I have discussed indications, details, and potential complications of the surgery. Questions are answered. She understands these issues. She agrees with this  plan.   Angelia Mould. Derrell Lolling, M.D., [facility] General and Minimally invasive Surgery Breast and Colorectal Surgery Office:   [phone] Pager:   [phone]  04/11/2011 6:35 AM     Visit Diagnoses: 1. SBO (small bowel obstruction)     Primary Care Physician: Evlyn Courier, MD, MD

## 2011-04-11 NOTE — Op Note (Signed)
Preop Diagnosis:  Port site hernia with small bowel obstruction and incarceration  Post op diagnosis:  Same  Procedure:  Exploratory laparotomy with closure of port site hernia  Surgeon: Harriette Bouillon MD  Anesthesia : GET  EBL: 50 CC  Specimen:  None  Drains:  None  Indication for procedure:  The patient is 1 week post op from robotic hysterectomy.  She developed nausea and vomiting and was seen in the ED.  CT showed a port site hernia with small bowel obstruction.  Treatment options discussed and I recommended exploratory laparotomy.  Risks,  Benefits and alternative therapies discussed.  She agreed to proceed.The risk of hernia repair include bleeding,  Infection,   Recurrence of the hernia,  Mesh use, chronic pain,  Organ injury,  Bowel injury,  Bladder injury,   nerve injury with numbness around the incision,  Death,  and worsening of preexisting  medical problems.  The alternatives to surgery have been discussed as well..  Long term expectations of both operative and non operative treatments have been discussed.   The patient agrees to proceed.  Description of procedure:  The patient was seen in the holding area and questions were answered.  She was taken to the operating room and placed supine.  General anesthesia was initiated.  The abdomen was prepped and draped in a sterile manner.  Time out was done.  Pt received antibiotics.   A midline incision was used.  An umbilical defect was noted at the umbilical port site.  The small bowel was caught in this defect.    I reduced this.  The bowel was viable.    The fascial defect was closed with number 1 novofil.   The subcutaneous  Tissue was closed with 2 0 vicryl.  Staples were used to close the skin.   All final counts were correct.  The patient was awoke and taken to the PACU in stable condition.

## 2011-04-11 NOTE — Transfer of Care (Signed)
Immediate Anesthesia Transfer of Care Note  Patient: Denise [name]  Procedure(s) Performed:  EXPLORATORY LAPAROTOMY - closure port hole  Patient Location: PACU  Anesthesia Type: General  Level of Consciousness: awake, alert  and oriented  Airway & Oxygen Therapy: Patient Spontanous Breathing and Patient connected to face mask oxygen  Post-op Assessment: Report given to PACU RN and Post -op Vital signs reviewed and stable  Post vital signs: Reviewed and stable  Complications: No apparent anesthesia complications

## 2011-04-12 LAB — CBC
HCT: 33.6 % — ABNORMAL LOW (ref 36.0–46.0)
Hemoglobin: 10.3 g/dL — ABNORMAL LOW (ref 12.0–15.0)
MCH: 25.6 pg — ABNORMAL LOW (ref 26.0–34.0)
MCHC: 30.7 g/dL (ref 30.0–36.0)
MCV: 83.4 fL (ref 78.0–100.0)
Platelets: 404 10*3/uL — ABNORMAL HIGH (ref 150–400)
RBC: 4.03 MIL/uL (ref 3.87–5.11)
RDW: 15 % (ref 11.5–15.5)
WBC: 5.5 10*3/uL (ref 4.0–10.5)

## 2011-04-12 LAB — BASIC METABOLIC PANEL
BUN: 13 mg/dL (ref 6–23)
CO2: 29 mEq/L (ref 19–32)
Calcium: 8.3 mg/dL — ABNORMAL LOW (ref 8.4–10.5)
Chloride: 107 mEq/L (ref 96–112)
Creatinine, Ser: 0.95 mg/dL (ref 0.50–1.10)
GFR calc Af Amer: 75 mL/min — ABNORMAL LOW (ref >90)
GFR calc non Af Amer: 64 mL/min — ABNORMAL LOW (ref >90)
Glucose, Bld: 96 mg/dL (ref 70–99)
Potassium: 3.5 mEq/L (ref 3.5–5.1)
Sodium: 143 mEq/L (ref 135–145)

## 2011-04-12 NOTE — Progress Notes (Signed)
Pt seen.  Chart reviewed.    Very appreciative of care/management of this patient by General Surgery.  Available if needed.

## 2011-04-12 NOTE — Progress Notes (Signed)
Looks ok.  Cont current care.

## 2011-04-12 NOTE — Progress Notes (Signed)
Pt urinary output decreased this shift, urinary output 150 for 7 hours, order to D/C foley this AM. MD on call notified, Urinary output acknowledge, 25 cc /hr ok per MD, and ok to D/C foley this AM as ordered. To continue to moniter.

## 2011-04-12 NOTE — Progress Notes (Signed)
Noted order to D/C foley PO day #1. To leave foley in at this time and continue to moniter.

## 2011-04-12 NOTE — Progress Notes (Signed)
Patient ID: Denise [name], female   DOB: Jun 28, 1950, 61 y.o.   MRN: [id number] 1 Day Post-Op  Subjective: Pt feels better.  She is sore.  Pain is controlled.  No flatus, but not nauseated.   Objective: Vital signs in last 24 hours: Temp:  [97.9 F (36.6 C)-99.9 F (37.7 C)] 98.9 F (37.2 C) (01/23 0558) Pulse Rate:  [81-102] 85  (01/23 0558) Resp:  [8-20] 15  (01/23 0806) BP: (99-140)/(26-79) 126/77 mmHg (01/23 0558) SpO2:  [94 %-100 %] 99 % (01/23 0806) Weight:  [260 lb (117.935 kg)] 260 lb (117.935 kg) (01/22 1840) Last BM Date: 04/10/11  Intake/Output from previous day: 01/22 0701 - 01/23 0700 In: 3206 [I.V.:3106] Out: 425 [Urine:375; Blood:50] Intake/Output this shift:    PE: Abd: soft, tender appropriately.  Few BS.  Incision covered, but clean.  Few areas of dry drainage, nothing fresh. Heart: regular Lungs: CTAB  Lab Results:   Basename 04/12/11 0435 04/11/11 0050  WBC 5.5 9.5  HGB 10.3* 13.8  HCT 33.6* 42.4  PLT 404* 338   BMET  Basename 04/12/11 0435 04/11/11 0050  NA 143 141  K 3.5 3.9  CL 107 101  CO2 29 26  GLUCOSE 96 161*  BUN 13 10  CREATININE 0.95 0.72  CALCIUM 8.3* 10.5   PT/INR No results found for this basename: LABPROT:2,INR:2 in the last 72 hours   Studies/Results: Ct Abdomen Pelvis W Contrast  04/11/2011  *RADIOLOGY REPORT*  Clinical Data: Abdominal pain, nausea and vomiting; recent total hysterectomy.  CT ABDOMEN AND PELVIS WITH CONTRAST  Technique:  Multidetector CT imaging of the abdomen and pelvis was performed following the standard protocol during bolus administration of intravenous contrast.  Contrast: OMNIPAQUE IOHEXOL 300 MG/ML IV SOLN  Comparison: CT of the abdomen and pelvis from 02/13/2006, and abdominal radiograph performed earlier today at 01:35 a.m.  Findings: The visualized lung bases are clear.  There is diffuse prominence of the intrahepatic biliary ducts.  The common hepatic duct measures 1.8 cm in diameter; this is  unusually prominent status post cholecystectomy.  However, there is no definite evidence of distal obstruction.  Scattered clips are noted along the gallbladder fossa.  The spleen is unremarkable in appearance.  The pancreas and adrenal glands are grossly unremarkable.  Focal scarring is noted at the interpole region of the right kidney.  The kidneys are otherwise unremarkable in appearance. There is no evidence of hydronephrosis.  No renal or ureteral stones are seen.  No significant perinephric stranding is appreciated.  There is diffuse distension of the proximal small bowel, to the level of the mid to distal ileum.  The small bowel is diffusely filled with fluid and air; associated free fluid is noted tracking about small bowel loops at the right mid abdomen and right lower quadrant, and there is mesenteric inflammation noted at the level of the pelvis.  A focal transition point is noted within an anterior abdominal wall hernia at the level of the umbilicus.  A small amount of fluid is noted in the short segment of bowel within the hernia, with diffuse associated soft tissue inflammation; more distal small bowel is entirely decompressed.  Findings are compatible with high-grade small bowel obstruction.  There is no evidence of bowel perforation at this time.   Multiple bowel suture lines are noted within the small bowel, and along the lesser curvature of the stomach.  The stomach is filled with fluid and otherwise grossly unremarkable.  No acute vascular abnormalities are  seen.  Mild scattered calcification is noted along the abdominal aorta and its branches.  The appendix is normal in caliber, without evidence for appendicitis.  The colon is diffusely decompressed.  Scattered diverticulosis is noted along the entirety of the colon, without evidence of diverticulitis.  The bladder is decompressed and not well assessed.  A small to moderate amount of free fluid is noted within the pelvis; this likely reflects the  small bowel process described above.  The patient is status post hysterectomy; a residual calcified 4.8 cm fibroid is noted at the right hemipelvis.  No suspicious adnexal masses are seen.  No inguinal lymphadenopathy is seen.  Minimal scattered air is noted within the patient's pannus, likely postoperative in nature.  No acute osseous abnormalities are identified.  Subcortical cystic change is seen at both acetabula.  IMPRESSION:  1.  High-grade small bowel obstruction, with a focal transition point in the anterior abdominal wall hernia at the level of the umbilicus.  Soft tissue inflammation noted at the umbilical hernia with an associated incarcerated mid to distal ileal loop; decompression of the more distal small bowel and the colon. 2.  Soft tissue inflammation and fluid noted tracking about multiple small bowel loops in the lower abdomen; small to moderate amount of free fluid noted in the pelvis.  Mesenteric inflammation noted in the pelvis.  No evidence of perforation. 3.  Dilatation of the common hepatic duct to 1.8 cm in diameter; diffuse prominence of the intrahepatic biliary ducts.  This is more than expected status post cholecystectomy, but likely within normal limits. 4.  Focal right renal scarring noted. 5.  Calcified 4.8 cm fibroid noted at the right hemipelvis. 6.  Mild degenerative change at both hip joints.  Findings were discussed with Dr. Tama Gander at 04:55 a.m. on 04/11/2011.  Original Report Authenticated By: Tonia Ghent, M.D.   Dg Abd Acute W/chest  04/11/2011  *RADIOLOGY REPORT*  Clinical Data: Nausea and lower abdominal pain for 72 hours.  ACUTE ABDOMEN SERIES (ABDOMEN 2 VIEW & CHEST 1 VIEW)  Comparison: Abdominal radiograph performed 11/27/2006, and CT of the abdomen and pelvis performed 02/13/2006; chest radiograph performed 03/31/2011  Findings: The lungs are well-aerated and clear.  There is no evidence of focal opacification, pleural effusion or pneumothorax. The  cardiomediastinal silhouette is within normal limits.  There is diffuse distension of small bowel loops to 4.4 cm in maximal diameter, with associated air-fluid levels, concerning for at least some degree of bowel obstruction.  The colon appears relatively decompressed.  No free intra-abdominal air is identified on the provided upright view.  Clips are noted within the right upper quadrant, reflecting prior cholecystectomy.  No acute osseous abnormalities are seen; the sacroiliac joints are unremarkable in appearance.  A calcified uterine fibroid is again noted.  IMPRESSION:  1.  Diffuse distension of small bowel loops to 4.4 cm in maximal diameter, with associated air-fluid levels, concerning for at least some degree of bowel obstruction.  No free intra-abdominal air seen. 2.  No acute cardiopulmonary process identified. 3.  Calcified uterine fibroid again seen.  Findings were discussed with Tama Gander at 01:56 a.m. on 04/11/2011.  Original Report Authenticated By: Tonia Ghent, M.D.    Anti-infectives: Anti-infectives     Start     Dose/Rate Route Frequency Ordered Stop   04/11/11 0800   ertapenem (INVANZ) 1 g in sodium chloride 0.9 % 50 mL IVPB  Status:  Discontinued        1 g 100 mL/hr  over 30 Minutes Intravenous Every 24 hours 04/11/11 0651 04/11/11 0720   04/11/11 0725   ceFAZolin (ANCEF) IVPB 2 g/50 mL premix        2 g 100 mL/hr over 30 Minutes Intravenous 60 min pre-op 04/11/11 0725 04/11/11 1234           Assessment/Plan  1. S/p repair of incarcerated port site hernia  Plan: 1. Will keep npo except ice chips and sips of clears since she has no nausea and has some bowel sounds. 2. Encourage ambulation and pulm toilet. 3. Cont PCA 4. D/c foley today 5. Await bowel function.   LOS: 1 day    Larenda Reedy E 04/12/2011

## 2011-04-13 LAB — CBC
HCT: 31.7 % — ABNORMAL LOW (ref 36.0–46.0)
Hemoglobin: 9.6 g/dL — ABNORMAL LOW (ref 12.0–15.0)
MCH: 25.3 pg — ABNORMAL LOW (ref 26.0–34.0)
MCHC: 30.3 g/dL (ref 30.0–36.0)
MCV: 83.6 fL (ref 78.0–100.0)
Platelets: 337 10*3/uL (ref 150–400)
RBC: 3.79 MIL/uL — ABNORMAL LOW (ref 3.87–5.11)
RDW: 14.7 % (ref 11.5–15.5)
WBC: 5.9 10*3/uL (ref 4.0–10.5)

## 2011-04-13 NOTE — Progress Notes (Signed)
Patient ID: Denise [name], female   DOB: January 07, 1951, 61 y.o.   MRN: [id number] 2 Days Post-Op  Subjective: Pt with no flatus, lots of belching.  Having some pain.  Objective: Vital signs in last 24 hours: Temp:  [97.3 F (36.3 C)-99.4 F (37.4 C)] 97.9 F (36.6 C) (01/24 0546) Pulse Rate:  [81-89] 81  (01/24 0546) Resp:  [14-20] 16  (01/24 0546) BP: (103-145)/(70-84) 107/70 mmHg (01/24 0546) SpO2:  [94 %-100 %] 100 % (01/24 0546) Last BM Date: 04/09/11  Intake/Output from previous day: 01/23 0701 - 01/24 0700 In: -  Out: 1000 [Urine:1000] Intake/Output this shift:    PE: Abd: soft, very hypoactive BS today.  Incision is c/d/i with staples present. Obese, ND.  Lab Results:   Basename 04/13/11 0411 04/12/11 0435  WBC 5.9 5.5  HGB 9.6* 10.3*  HCT 31.7* 33.6*  PLT 337 404*   BMET  Basename 04/12/11 0435 04/11/11 0050  NA 143 141  K 3.5 3.9  CL 107 101  CO2 29 26  GLUCOSE 96 161*  BUN 13 10  CREATININE 0.95 0.72  CALCIUM 8.3* 10.5   PT/INR No results found for this basename: LABPROT:2,INR:2 in the last 72 hours   Studies/Results: No results found.  Anti-infectives: Anti-infectives     Start     Dose/Rate Route Frequency Ordered Stop   04/11/11 0800   ertapenem (INVANZ) 1 g in sodium chloride 0.9 % 50 mL IVPB  Status:  Discontinued        1 g 100 mL/hr over 30 Minutes Intravenous Every 24 hours 04/11/11 0651 04/11/11 0720   04/11/11 0725   ceFAZolin (ANCEF) IVPB 2 g/50 mL premix        2 g 100 mL/hr over 30 Minutes Intravenous 60 min pre-op 04/11/11 0725 04/11/11 1234           Assessment/Plan  1. S/p incisional hernia repair 2. sBO secondary to #1 3. Post-op ileus  Plan: 1. Will leave on sips of clears.  The patient is not nauseated, but belching more and is without flatus and many BS. 2. Cont ambulation in halls TID   LOS: 2 days    Daymen Hassebrock E 04/13/2011

## 2011-04-13 NOTE — Progress Notes (Signed)
Passing flatus looks good.

## 2011-04-14 LAB — GLUCOSE, CAPILLARY
Glucose-Capillary: 132 mg/dL — ABNORMAL HIGH (ref 70–99)
Glucose-Capillary: 124 mg/dL — ABNORMAL HIGH (ref 70–99)

## 2011-04-14 MED ORDER — OXYCODONE-ACETAMINOPHEN 5-325 MG PO TABS: 1.0000 | ORAL_TABLET | ORAL | Status: DC | PRN

## 2011-04-14 MED ORDER — OXYCODONE-ACETAMINOPHEN 5-325 MG PO TABS: 1.0000 | ORAL_TABLET | ORAL | Status: AC | PRN

## 2011-04-14 NOTE — Discharge Summary (Signed)
Doing well.  Home today 

## 2011-04-14 NOTE — Discharge Summary (Signed)
Patient ID: STPEHANIE MONTROY MRN: [id number] DOB/AGE: June 16, 1950 61 y.o.  Admit date: 04/11/2011 Discharge date: 04/14/2011  Procedures:  Open repair of incarcerated incisional hernia  Consults: None  Reason for Admission:  This is a 61 yo BF who underwent a robotic assisted bilateral salping-oophorectomy on 04-04-11.  She was doing well at home, until she began to have nausea and vomiting approximately a week after surgery.  She came to the [facility] where she was found to have a SBO and incarcerated hernia and her umbilical port site.  She was then admitted.  Admission Diagnoses: 1. SBO secondary to incarcerated incision hernia 2. S/p recent GYN surgery 3. HTN  Hospital Course: The patient was admitted and was taken to the operating room the following day for surgical repair.  She had an open repair with primary closure.  The patient tolerated the procedure well.  Postoperatively, she did well.  She had a mild post-op ileus, but her diet was able to be advanced as she tolerated it.  She was on a solid diet by POD# 3.  Her incision was c/d/i with staples and these remained in place at the time of discharge.  PE: abd: soft, minimally tender, +BS, ND, obese.  Incision c/d/i with staples present.  Discharge Diagnoses:  Principal Problem:  *Small bowel obstruction Active Problems:  Incisional hernia with obstruction s/p laparotomy with repair of port site hernia  Discharge Medications: Medication List  As of 04/14/2011  8:34 AM   TAKE these medications         amLODipine 5 MG tablet   Commonly known as: NORVASC   Take 5 mg by mouth daily after breakfast.      aspirin 81 MG tablet   Take 81 mg by mouth daily before breakfast.      CALCIUM 600 PO   Take 1 tablet by mouth at bedtime.      ferrous sulfate 325 (65 FE) MG tablet   Take 325 mg by mouth daily with breakfast.      gabapentin 300 MG capsule   Commonly known as: NEURONTIN   Take 300 mg by mouth at bedtime.      gabapentin 100  MG capsule   Commonly known as: NEURONTIN   Take 100 mg by mouth 2 (two) times daily.      mulitivitamin with minerals Tabs   Take 1 tablet by mouth daily before breakfast. Centrum silver      oxyCODONE-acetaminophen 5-325 MG per tablet   Commonly known as: PERCOCET   Take 2 tablets by mouth every 6 (six) hours as needed for pain.      oxyCODONE-acetaminophen 5-325 MG per tablet   Commonly known as: PERCOCET   Take 1-2 tablets by mouth every 4 (four) hours as needed.      TUMS PO   Take 4 tablets by mouth 2 (two) times daily.      Vitamin B-12 2500 MCG Subl   Place 1 tablet under the tongue daily.            Discharge Instructions: Follow-up Information    Follow up with Evlyn Courier, MD.      Follow up with Dortha Schwalbe., MD on 04/21/2011. (10:00am for staple removal)    Contact information:   [employer] [address], [address] [place] [address] [phone]          Signed: Letha Cape 04/14/2011, 8:34 AM

## 2011-04-14 NOTE — Progress Notes (Signed)
Patient provided with discharge teaching and prescription. Patient verbalized understanding. Patient discharged to home.  

## 2011-04-14 NOTE — Progress Notes (Signed)
Discharge summary sent to payer through [facility]  

## 2011-04-18 ENCOUNTER — Telehealth (INDEPENDENT_AMBULATORY_CARE_PROVIDER_SITE_OTHER): Payer: Self-pay | Admitting: Surgery

## 2011-04-19 NOTE — Telephone Encounter (Signed)
I SPOKE WITH MS Blossom RE R-T-W NOTE/ I WILL SPEAK WITH DR. Luisa Hart TODAY AND FILL OUT GENERIC FORM/GY

## 2011-04-21 ENCOUNTER — Telehealth (INDEPENDENT_AMBULATORY_CARE_PROVIDER_SITE_OTHER): Payer: Self-pay

## 2011-04-21 ENCOUNTER — Encounter (INDEPENDENT_AMBULATORY_CARE_PROVIDER_SITE_OTHER): Payer: PRIVATE HEALTH INSURANCE | Admitting: Surgery

## 2011-04-21 NOTE — Telephone Encounter (Signed)
Pt called stating she bent over this morning and had watery, clear yellow with a little red fluid with no odor drain out on her shirt from abd wd. No redness,swelling or fever. She states wound is closed except a small area at top of wound where drainage came from. She states suture line still intact. Pt advised this may have been at seroma that has found a way to drain under pressure of her bending over. Pt advised to clean area and cover with dry dsg. Pt advised to call if redness,fever,foul odor or drainage shows any sign of becoming infected. Pt will keep appt for 2-5 to have wd ck and staple removal unless other signs or symptoms occur.

## 2011-04-25 ENCOUNTER — Encounter (INDEPENDENT_AMBULATORY_CARE_PROVIDER_SITE_OTHER): Payer: Self-pay | Admitting: Radiology

## 2011-04-25 ENCOUNTER — Ambulatory Visit (INDEPENDENT_AMBULATORY_CARE_PROVIDER_SITE_OTHER): Payer: PRIVATE HEALTH INSURANCE | Admitting: Radiology

## 2011-04-25 VITALS — BP 128/76 | Ht 62.0 in | Wt 256.8 lb

## 2011-04-25 DIAGNOSIS — Z9889 Other specified postprocedural states: Secondary | ICD-10-CM

## 2011-04-25 DIAGNOSIS — Z8719 Personal history of other diseases of the digestive system: Secondary | ICD-10-CM

## 2011-04-25 NOTE — Progress Notes (Signed)
  Subjective: 61yo AA female a few weeks post op hernia repair by Dr. Luisa Hart from trocar site after lap hysterectomy. She' shere for brief recheck and staple removal. She's doing well. No N/V, mild pains. Some drainage from her incision, serous. Denies fever.  Objective: Incision mostly healed. Few areas of proud flesh and weeping serous fluid. No pus. Staples removed without issue.   Physical Exam: BP 128/76  Ht 5\' 2"  (1.575 m)  Wt 116.484 kg (256 lb 12.8 oz)  BMI 46.97 kg/m2    Assessment: S/p repair incisional hernia from trocar site.     Plan: Staples removed. Has follow up scheduled with GYN and another appointment with Dr. Luisa Hart in a few weeks.   Marianna Fuss PA-C 04/25/2011 2:59 PM

## 2011-05-04 ENCOUNTER — Ambulatory Visit: Payer: PRIVATE HEALTH INSURANCE | Attending: Gynecologic Oncology | Admitting: Gynecologic Oncology

## 2011-05-04 ENCOUNTER — Encounter: Payer: Self-pay | Admitting: Gynecologic Oncology

## 2011-05-04 VITALS — BP 130/80 | HR 74 | Temp 97.9°F | Resp 16 | Ht 62.32 in | Wt 252.8 lb

## 2011-05-04 DIAGNOSIS — Z7982 Long term (current) use of aspirin: Secondary | ICD-10-CM | POA: Insufficient documentation

## 2011-05-04 DIAGNOSIS — K439 Ventral hernia without obstruction or gangrene: Secondary | ICD-10-CM | POA: Insufficient documentation

## 2011-05-04 DIAGNOSIS — M545 Low back pain, unspecified: Secondary | ICD-10-CM | POA: Insufficient documentation

## 2011-05-04 DIAGNOSIS — D252 Subserosal leiomyoma of uterus: Secondary | ICD-10-CM | POA: Insufficient documentation

## 2011-05-04 DIAGNOSIS — N801 Endometriosis of ovary: Secondary | ICD-10-CM | POA: Insufficient documentation

## 2011-05-04 DIAGNOSIS — Z9079 Acquired absence of other genital organ(s): Secondary | ICD-10-CM | POA: Insufficient documentation

## 2011-05-04 DIAGNOSIS — D279 Benign neoplasm of unspecified ovary: Secondary | ICD-10-CM | POA: Insufficient documentation

## 2011-05-04 DIAGNOSIS — Z90710 Acquired absence of both cervix and uterus: Secondary | ICD-10-CM | POA: Insufficient documentation

## 2011-05-04 DIAGNOSIS — D649 Anemia, unspecified: Secondary | ICD-10-CM | POA: Insufficient documentation

## 2011-05-04 DIAGNOSIS — R1900 Intra-abdominal and pelvic swelling, mass and lump, unspecified site: Secondary | ICD-10-CM

## 2011-05-04 DIAGNOSIS — I10 Essential (primary) hypertension: Secondary | ICD-10-CM | POA: Insufficient documentation

## 2011-05-04 DIAGNOSIS — N839 Noninflammatory disorder of ovary, fallopian tube and broad ligament, unspecified: Secondary | ICD-10-CM | POA: Insufficient documentation

## 2011-05-04 DIAGNOSIS — N80109 Endometriosis of ovary, unspecified side, unspecified depth: Secondary | ICD-10-CM | POA: Insufficient documentation

## 2011-05-04 DIAGNOSIS — Z803 Family history of malignant neoplasm of breast: Secondary | ICD-10-CM | POA: Insufficient documentation

## 2011-05-04 DIAGNOSIS — Z79899 Other long term (current) drug therapy: Secondary | ICD-10-CM | POA: Insufficient documentation

## 2011-05-04 NOTE — Progress Notes (Signed)
Consult Note: Gyn-Onc  Consult was requested by Dr. Donzetta Matters for the evaluation of Denise [name] 61 y.o. female  CC:  Chief Complaint  Patient presents with  . Ovarian Mass    New pt    HPI:  Denise [name] is a 61 year old nulliparous female with low back pain since early 2011. An MRI of the lumbar spine was obtained and showed an abnormality in the area of the adnexa.  A transvaginal ultrasound obtained on 01/19/2011 showed a right ovary measuring 4.6 x 3.4 3.1 cm with a small complex lesion measuring 1.6 x 1.4 x 1.1 cm. Left ovary measures 4.7 x 3.2 x 3.3 cm and complete a complex cyst the cyst is unilocular and contain a mural nodule measuring 1.2 x 1.2 x 1.6 cm the mural nodule had both solid and cystic components. No color flow is identified within the nodule there is a trace amount of simple free fluid in the cul-de-sac.   An MRI of the pelvis was completed on 01/29/2011. It demonstrated a postmenopausal uterus measuring 6.5 x 3.1 x 3.7 cm. A 4.9 x 4.1 x 3.6 cm right lateral subserosal fibroids appreciated. Endometrial stripe measured 5 mm. Left ovary contained a complex cystic lesion measuring 3.0 x 2.8 x 2.4 cm. This lesion showed a few thin internal septations and a small less than 1 cm mural nodular density. The right ovary contained a 1.5 cm cyst consistent with endometrioma. There was no evidence of pelvic lymphadenopathy or free fluid sigmoid diverticulosis was identified  Interval History: On 04/04/2011 she underwent robotic-assisted laparoscopic bilateral salpingo-oophorectomy. Final pathology demonstrated the presence of left ovary and fallopian tube with benign ovarian tissue with endometriosis and endosalpingosis. The right ovary was noted to have a benign serous cystadenoma and endometriosis there was no evidence of atypia or malignancy in any of the surgical specimens. One week later the patient developed nausea and vomiting and was evaluated in the emergency department. A CT scan showed  a port site hernia with small bowel obstruction. Talked to [employer] evaluated the patient and performed an exploratory laparotomy with release and reduction of of the entrapped small bowel.  The patient presents with complaints of discharge from the incisional site  Review of Systems:  Constitutional  Feels well,  Skin/Breast  No rash, sores, jaundice, itching, dryness Cardiovascular  No chest pain, shortness of breath, or edema  Pulmonary  No cough or wheeze.  Gastro Intestinal  No nausea, vomitting, or diarrhoea. No bright red blood per rectum, no abdominal pain, change in bowel movement, or constipation. No bloating no early satiety or change in appetite. Genito Urinary  No frequency, urgency, dysuria, no vaginal bleeding vaginal discharge. Musculo Skeletal  No myalgia, arthralgia, joint swelling or pain  Neurologic  No weakness, numbness, change in gait,  Psychology  No depression, anxiety, insomnia.    Current Meds:  Outpatient Encounter Prescriptions as of 03/29/2011  Medication Sig Dispense Refill  . amLODipine (NORVASC) 5 MG tablet Take 5 mg by mouth daily.      Marland Kitchen aspirin 81 MG tablet Take 81 mg by mouth daily.        . calcium carbonate (OS-CAL - DOSED IN MG OF ELEMENTAL CALCIUM) 1250 MG tablet Take 1 tablet by mouth daily.        . Calcium Carbonate Antacid (TUMS PO) Take by mouth.        . Ferrous Gluconate (IRON) 240 (27 FE) MG TABS Take by mouth daily.       Marland Kitchen  gabapentin (NEURONTIN) 100 MG capsule TAKE 1 CAPSULE 3 TIMES A DAY  90 capsule  2  . Multiple Vitamin (MULTIVITAMIN) capsule Take 1 capsule by mouth daily.        . traMADol (ULTRAM) 50 MG tablet Take 50 mg by mouth every 6 (six) hours as needed. Taking twice a day      . vitamin B-12 (CYANOCOBALAMIN) 250 MCG tablet Take 250 mcg by mouth daily.        . Acetaminophen-Codeine 300-30 MG per tablet Take 1-2 tablets by mouth every 6 (six) hours as needed for pain.  40 tablet  1   Prednisone 10 mg twice daily  for the past day and a half Allergy: No Known Allergies  Social Hx:   History   Social History  . Marital Status: Single    Spouse Name: N/A    Number of Children: N/A  . Years of Education: 12th grade   Occupational History  . unemployed    Social History Main Topics  . Smoking status: Former Smoker    Types: Cigarettes    Quit date: 03/20/1976  . Smokeless tobacco: Not on file  . Alcohol Use: No  . Drug Use: Not on file  . Sexually Active: Not on file   Other Topics Concern  . Not on file   Social History Narrative  . No narrative on file    Past Surgical Hx:  Past Surgical History  Procedure Date  . Gallbladder surgery   . Gastric bypass     Past Medical Hx:  Past Medical History  Diagnosis Date  . HTN (hypertension)   . Anemia   . HTN (hypertension)     Past Gynecological History: Gravida 0 menarche in early teenage years with regular menses last Pap test November 2012 within normal limits. Denies any history of abnormal Pap tests are abnormal uterine bleeding  Family Hx:  Family History  Problem Relation Age of Onset  . Arthritis    . Cancer    . Breast cancer Mother   . Hypertension Father   . Diabetes Father     Vitals:  Blood pressure 140/84, pulse 68, temperature 97.9 F (36.6 C), resp. rate 20, height 5' 2.32" (1.583 m), weight 260 lb 3.2 oz (118.026 kg).  Physical Exam: WD in NAD Neck  Supple NROM, without any enlargements.  Lymph Node Survey No cervical supraclavicular or inguinal adenopathy Cardiovascular  Pulse normal rate, regularity and rhythm. S1 and S2 normal.  Lungs  Clear to auscultation bilateraly, without wheezes/crackles/rhonchi. Good air movement.  Skin  No rash/lesions/breakdown  Psychiatry  Alert and oriented to person, place, and time  Abdomen  Normoactive bowel sounds, abdomen soft, non-tender and obese. The periumbilical laparotomy site is notable for a 5 mm defect with a depth of approximately 2 cm.  The other  surgical  sites intact without evidence of hernia. Back No CVA tenderness Extremities  No bilateral cyanosis, clubbing or edema.   Assessment/Plan:  Denise [name]  is a 61 y.o. status post robotic laparoscopic bilateral salpingo-oophorectomy for bilateral endometriomas and a right serous cystadenoma. Postoperative course was complicated a port site hernia which required exploratory laparotomy reduction of the small bowel and closure of the port sites. Patient and her mother were counseled regarding care of the inferior most aspect of the laparotomy site that's open. She's been advised to followup in approximately 4 weeks.   Laurette Schimke, MD, PhD 03/29/2011, 12:09 PM

## 2011-05-04 NOTE — Patient Instructions (Signed)
Patient and her mother were counseled regarding care of the inferior most aspect of the laparotomy site that's open.  Advised to followup in approximately 4 weeks.

## 2011-05-09 ENCOUNTER — Encounter (HOSPITAL_COMMUNITY): Payer: Self-pay | Admitting: Surgery

## 2011-05-15 ENCOUNTER — Ambulatory Visit (INDEPENDENT_AMBULATORY_CARE_PROVIDER_SITE_OTHER): Payer: PRIVATE HEALTH INSURANCE | Admitting: Surgery

## 2011-05-15 ENCOUNTER — Encounter (INDEPENDENT_AMBULATORY_CARE_PROVIDER_SITE_OTHER): Payer: Self-pay | Admitting: Surgery

## 2011-05-15 VITALS — BP 138/86 | HR 68 | Temp 97.8°F | Resp 18 | Ht 62.0 in | Wt 250.8 lb

## 2011-05-15 DIAGNOSIS — Z9889 Other specified postprocedural states: Secondary | ICD-10-CM

## 2011-05-15 NOTE — Patient Instructions (Signed)
Return to work full duty 

## 2011-05-15 NOTE — Progress Notes (Signed)
Patient returns in followup after her repair of a port site hernia one month ago. She is doing well.  Exam: Incision is intact. A small piece of packing noted in superior incision removed. The wound is clean with no signs of infection.  Impression status post repair of port site hernia  Plan: Return to work full duty. Followup p.r.n.

## 2011-05-23 ENCOUNTER — Ambulatory Visit: Payer: PRIVATE HEALTH INSURANCE | Attending: Gynecologic Oncology | Admitting: Gynecologic Oncology

## 2011-05-23 ENCOUNTER — Encounter: Payer: Self-pay | Admitting: Gynecologic Oncology

## 2011-05-23 VITALS — BP 152/76 | HR 86 | Temp 98.3°F | Resp 18 | Ht 62.0 in | Wt 252.2 lb

## 2011-05-23 DIAGNOSIS — Z7982 Long term (current) use of aspirin: Secondary | ICD-10-CM | POA: Insufficient documentation

## 2011-05-23 DIAGNOSIS — D279 Benign neoplasm of unspecified ovary: Secondary | ICD-10-CM | POA: Insufficient documentation

## 2011-05-23 DIAGNOSIS — Z90710 Acquired absence of both cervix and uterus: Secondary | ICD-10-CM | POA: Insufficient documentation

## 2011-05-23 DIAGNOSIS — Z87891 Personal history of nicotine dependence: Secondary | ICD-10-CM | POA: Insufficient documentation

## 2011-05-23 DIAGNOSIS — N83209 Unspecified ovarian cyst, unspecified side: Secondary | ICD-10-CM

## 2011-05-23 DIAGNOSIS — K469 Unspecified abdominal hernia without obstruction or gangrene: Secondary | ICD-10-CM | POA: Insufficient documentation

## 2011-05-23 DIAGNOSIS — Z9889 Other specified postprocedural states: Secondary | ICD-10-CM | POA: Insufficient documentation

## 2011-05-23 DIAGNOSIS — I10 Essential (primary) hypertension: Secondary | ICD-10-CM | POA: Insufficient documentation

## 2011-05-23 DIAGNOSIS — N809 Endometriosis, unspecified: Secondary | ICD-10-CM | POA: Insufficient documentation

## 2011-05-23 NOTE — Patient Instructions (Signed)
  F/U with Dr. Donzetta Matters

## 2011-05-23 NOTE — Progress Notes (Signed)
Consult Note: Gyn-Onc  Consult was requested by Dr. Donzetta Matters for the evaluation of Denise [name] 61 y.o. female  CC:  Chief Complaint  Patient presents with  . Ovarian Mass    New pt    HPI:  Ms. Clauson is a 61 year old nulliparous female with low back pain since early 2011. An MRI of the lumbar spine was obtained and showed an abnormality in the area of the adnexa.  A transvaginal ultrasound obtained on 01/19/2011 showed a right ovary measuring 4.6 x 3.4 3.1 cm with a small complex lesion measuring 1.6 x 1.4 x 1.1 cm. Left ovary measures 4.7 x 3.2 x 3.3 cm and complete a complex cyst the cyst is unilocular and contain a mural nodule measuring 1.2 x 1.2 x 1.6 cm the mural nodule had both solid and cystic components. No color flow is identified within the nodule there is a trace amount of simple free fluid in the cul-de-sac.   An MRI of the pelvis was completed on 01/29/2011. It demonstrated a postmenopausal uterus measuring 6.5 x 3.1 x 3.7 cm. A 4.9 x 4.1 x 3.6 cm right lateral subserosal fibroids appreciated. Endometrial stripe measured 5 mm. Left ovary contained a complex cystic lesion measuring 3.0 x 2.8 x 2.4 cm. This lesion showed a few thin internal septations and a small less than 1 cm mural nodular density. The right ovary contained a 1.5 cm cyst consistent with endometrioma. There was no evidence of pelvic lymphadenopathy or free fluid sigmoid diverticulosis was identified   On 04/04/2011 she underwent robotic-assisted laparoscopic bilateral salpingo-oophorectomy. Final pathology demonstrated the presence of left ovary and fallopian tube with benign ovarian tissue with endometriosis and endosalpingosis. The right ovary was noted to have a benign serous cystadenoma and endometriosis there was no evidence of atypia or malignancy in any of the surgical specimens. One week later the patient developed nausea and vomiting and was evaluated in the emergency department. A CT scan showed a port site  hernia with small bowel obstruction. She was evaluated by Harvie Junior MD. And underwent exploratory laparotomy with release and reduction of of the entrapped small bowel.     Interval History:  Patient is doing well.  Incision has healed.  No further episodes of emesis or nausea.  Review of Systems:  Constitutional  Feels well,  Skin/Breast  No rash, sores, jaundice, itching, dryness Cardiovascular  No chest pain, shortness of breath, or edema  Pulmonary  No cough or wheeze.  Gastro Intestinal  No nausea, vomitting, or diarrhoea. No bright red blood per rectum, no abdominal pain, change in bowel movement, or constipation. No bloating no early satiety or change in appetite. Psychology  No depression, anxiety, insomnia.    Current Meds:  Outpatient Encounter Prescriptions as of 03/29/2011  Medication Sig Dispense Refill  . amLODipine (NORVASC) 5 MG tablet Take 5 mg by mouth daily.      Marland Kitchen aspirin 81 MG tablet Take 81 mg by mouth daily.        . calcium carbonate (OS-CAL - DOSED IN MG OF ELEMENTAL CALCIUM) 1250 MG tablet Take 1 tablet by mouth daily.        . Calcium Carbonate Antacid (TUMS PO) Take by mouth.        . Ferrous Gluconate (IRON) 240 (27 FE) MG TABS Take by mouth daily.       Marland Kitchen gabapentin (NEURONTIN) 100 MG capsule TAKE 1 CAPSULE 3 TIMES A DAY  90 capsule  2  . Multiple Vitamin (  MULTIVITAMIN) capsule Take 1 capsule by mouth daily.        . traMADol (ULTRAM) 50 MG tablet Take 50 mg by mouth every 6 (six) hours as needed. Taking twice a day      . vitamin B-12 (CYANOCOBALAMIN) 250 MCG tablet Take 250 mcg by mouth daily.        . Acetaminophen-Codeine 300-30 MG per tablet Take 1-2 tablets by mouth every 6 (six) hours as needed for pain.  40 tablet  1   Prednisone 10 mg twice daily for the past day and a half Allergy: No Known Allergies  Social Hx:   History   Social History  . Marital Status: Single    Spouse Name: N/A    Number of Children: N/A  . Years of  Education: 12th grade   Occupational History  . unemployed    Social History Main Topics  . Smoking status: Former Smoker    Types: Cigarettes    Quit date: 03/20/1976  . Smokeless tobacco: Not on file  . Alcohol Use: No  . Drug Use: Not on file  . Sexually Active: Not on file   Other Topics Concern  . Not on file   Social History Narrative  . No narrative on file    Past Surgical Hx:  Past Surgical History  Procedure Date  . Gallbladder surgery   . Gastric bypass     Past Medical Hx:  Past Medical History  Diagnosis Date  . HTN (hypertension)   . Anemia   . HTN (hypertension)     Past Gynecological History: Gravida 0 menarche in early teenage years with regular menses last Pap test November 2012 within normal limits. Denies any history of abnormal Pap tests are abnormal uterine bleeding  Family Hx:  Family History  Problem Relation Age of Onset  . Arthritis    . Cancer    . Breast cancer Mother   . Hypertension Father   . Diabetes Father     Physical Exam: WD in NAD Neck  Supple NROM, without any enlargements.  Skin  No rash/lesions/breakdown  Psychiatry  Alert and oriented to person, place, and time  Abdomen  Normoactive bowel sounds, abdomen soft, non-tender and obese. The periumbilical laparotomy site is notable for a 5 mm defect with a depth of approximately 2 cm.  The other surgical  sites intact without evidence of hernia.   Assessment/Plan:  Ms. SHANIELLE CORRELL  is a 61 y.o. status post robotic laparoscopic bilateral salpingo-oophorectomy for bilateral endometriomas and a right serous cystadenoma. Postoperative course was complicated a port site hernia which required exploratory laparotomy reduction of the small bowel and closure of the port site..  F/U with Dr. Roselie Awkward, MD, PhD 03/29/2011, 12:09 PM

## 2011-07-05 ENCOUNTER — Other Ambulatory Visit: Payer: Self-pay | Admitting: Orthopedic Surgery

## 2012-02-07 ENCOUNTER — Ambulatory Visit: Payer: PRIVATE HEALTH INSURANCE | Admitting: Orthopedic Surgery

## 2012-02-29 ENCOUNTER — Encounter (HOSPITAL_COMMUNITY): Payer: Self-pay | Admitting: Pharmacy Technician

## 2012-03-05 ENCOUNTER — Encounter (HOSPITAL_COMMUNITY): Payer: Self-pay

## 2012-03-05 ENCOUNTER — Encounter (HOSPITAL_COMMUNITY)
Admission: RE | Admit: 2012-03-05 | Discharge: 2012-03-05 | Disposition: A | Discharge status: Home / Self Care | Payer: PRIVATE HEALTH INSURANCE | Source: Ambulatory Visit | Attending: Orthopedic Surgery | Admitting: Orthopedic Surgery

## 2012-03-05 LAB — CBC
HCT: 36.9 % (ref 36.0–46.0)
Hemoglobin: 11.7 g/dL — ABNORMAL LOW (ref 12.0–15.0)
MCH: 25.6 pg — ABNORMAL LOW (ref 26.0–34.0)
MCHC: 31.7 g/dL (ref 30.0–36.0)
MCV: 80.7 fL (ref 78.0–100.0)
Platelets: 394 10*3/uL (ref 150–400)
RBC: 4.57 MIL/uL (ref 3.87–5.11)
RDW: 15.1 % (ref 11.5–15.5)
WBC: 6 10*3/uL (ref 4.0–10.5)

## 2012-03-05 LAB — PROTIME-INR
INR: 0.98 (ref 0.00–1.49)
Prothrombin Time: 12.9 seconds (ref 11.6–15.2)

## 2012-03-05 LAB — BASIC METABOLIC PANEL
BUN: 9 mg/dL (ref 6–23)
CO2: 28 mEq/L (ref 19–32)
Calcium: 9.7 mg/dL (ref 8.4–10.5)
Chloride: 101 mEq/L (ref 96–112)
Creatinine, Ser: 0.77 mg/dL (ref 0.50–1.10)
GFR calc Af Amer: >90 mL/min (ref >90)
GFR calc non Af Amer: 89 mL/min — ABNORMAL LOW (ref >90)
Glucose, Bld: 96 mg/dL (ref 70–99)
Potassium: 4.2 mEq/L (ref 3.5–5.1)
Sodium: 142 mEq/L (ref 135–145)

## 2012-03-05 LAB — URINALYSIS, ROUTINE W REFLEX MICROSCOPIC
Bilirubin Urine: NEGATIVE
Glucose, UA: NEGATIVE mg/dL
Hgb urine dipstick: NEGATIVE
Leukocytes, UA: NEGATIVE
Nitrite: NEGATIVE
Protein, ur: NEGATIVE mg/dL
Specific Gravity, Urine: 1.026 (ref 1.005–1.030)
Urobilinogen, UA: 1 mg/dL (ref 0.0–1.0)
pH: 6 (ref 5.0–8.0)

## 2012-03-05 LAB — SURGICAL PCR SCREEN
MRSA, PCR: NEGATIVE
Staphylococcus aureus: NEGATIVE

## 2012-03-05 LAB — HEMOGLOBIN A1C
Hgb A1c MFr Bld: 6.3 % — ABNORMAL HIGH (ref <5.7)
Mean Plasma Glucose: 134 mg/dL — ABNORMAL HIGH (ref <117)

## 2012-03-05 LAB — ABO/RH: ABO/RH(D): O POS

## 2012-03-05 LAB — APTT: aPTT: 33 seconds (ref 24–37)

## 2012-03-05 NOTE — Progress Notes (Signed)
03/05/12 1355  OBSTRUCTIVE SLEEP APNEA  Have you ever been diagnosed with sleep apnea through a sleep study? No  Do you snore loudly (loud enough to be heard through closed doors)?  0  Do you often feel tired, fatigued, or sleepy during the daytime? 1  Has anyone observed you stop breathing during your sleep? 0  Do you have, or are you being treated for high blood pressure? 1  BMI more than 35 kg/m2? 1  Age over 60 years old? 1  Neck circumference greater than 40 cm/18 inches? 0  Gender: 0  Obstructive Sleep Apnea Score 4   Score 4 or greater  Results sent to PCP

## 2012-03-05 NOTE — Pre-Procedure Instructions (Addendum)
PREOP CBC, BMET, PT, PTT, UA, T/S WERE DONE TODAY - AT [facility] - AS PER ORDERS DR. Charlann Boxer.  PT'S EKG AND CXR REPORTS ARE IN EPIC FROM 04/11/11. PT BROUGHT ORDER FROM DR. G. HILL FOR HGB A 1-C--DRAWN WITH PT'S OTHER PREOP LABS.

## 2012-03-05 NOTE — Patient Instructions (Addendum)
YOUR SURGERY IS SCHEDULED AT [facility]  ON:  Thursday  12/19  REPORT TO [facility] AT:  9:00 AM      PHONE # FOR SHORT STAY IS [phone]  DO NOT EAT OR DRINK ANYTHING AFTER MIDNIGHT THE NIGHT BEFORE YOUR SURGERY.  YOU MAY BRUSH YOUR TEETH, RINSE OUT YOUR MOUTH--BUT NO WATER, NO FOOD, NO CHEWING GUM, NO MINTS, NO CANDIES, NO CHEWING TOBACCO.  PLEASE TAKE THE FOLLOWING MEDICATIONS THE AM OF YOUR SURGERY WITH A FEW SIPS OF WATER:  AMLODIPINE.  USE YOUR EYE DROPS.   IF DR. Nilsa Nutting OFFICE OKAYS STAYING ON GABAPENTIN- YOU MAY TAKE GABAPENTIN DAY OF SURGERY.  IF YOU USE INHALERS--USE YOUR INHALERS THE AM OF YOUR SURGERY AND BRING INHALERS TO THE HOSPITAL -TAKE TO SURGERY.    IF YOU ARE DIABETIC:  DO NOT TAKE ANY DIABETIC MEDICATIONS THE AM OF YOUR SURGERY.  IF YOU TAKE INSULIN IN THE EVENINGS--PLEASE ONLY TAKE 1/2 NORMAL EVENING DOSE THE NIGHT BEFORE YOUR SURGERY.  NO INSULIN THE AM OF YOUR SURGERY.  IF YOU HAVE SLEEP APNEA AND USE CPAP OR BIPAP--PLEASE BRING THE MASK AND THE TUBING.  DO NOT BRING YOUR MACHINE.  DO NOT BRING VALUABLES, MONEY, CREDIT CARDS.  DO NOT WEAR JEWELRY, MAKE-UP, NAIL POLISH AND NO METAL PINS OR CLIPS IN YOUR HAIR. CONTACT LENS, DENTURES / PARTIALS, GLASSES SHOULD NOT BE WORN TO SURGERY AND IN MOST CASES-HEARING AIDS WILL NEED TO BE REMOVED.  BRING YOUR GLASSES CASE, ANY EQUIPMENT NEEDED FOR YOUR CONTACT LENS. FOR PATIENTS ADMITTED TO THE HOSPITAL--CHECK OUT TIME THE DAY OF DISCHARGE IS 11:00 AM.  ALL INPATIENT ROOMS ARE PRIVATE - WITH BATHROOM, TELEPHONE, TELEVISION AND WIFI INTERNET.  IF YOU ARE BEING DISCHARGED THE SAME DAY OF YOUR SURGERY--YOU CAN NOT DRIVE YOURSELF HOME--AND SHOULD NOT GO HOME ALONE BY TAXI OR BUS.  NO DRIVING OR OPERATING MACHINERY FOR 24 HOURS FOLLOWING ANESTHESIA / PAIN MEDICATIONS.  PLEASE MAKE ARRANGEMENTS FOR SOMEONE TO BE WITH YOU AT HOME THE FIRST 24 HOURS AFTER SURGERY. RESPONSIBLE DRIVER'S  NAME___________________________                                               PHONE #   _______________________                               PLEASE READ OVER ANY  FACT SHEETS THAT YOU WERE GIVEN: MRSA INFORMATION, BLOOD TRANSFUSION INFORMATION, INCENTIVE SPIROMETER INFORMATION. FAILURE TO FOLLOW THESE INSTRUCTIONS MAY RESULT IN THE CANCELLATION OF YOUR SURGERY.   PATIENT SIGNATURE_________________________________

## 2012-03-06 NOTE — H&P (Signed)
TOTAL HIP ADMISSION H&P  Patient is admitted for right total hip arthroplasty, anterior approach.  Subjective:  Chief Complaint: Right hip OA / pain  HPI: Denise [name], 60 y.o. female, has a history of pain and functional disability in the right hip(s) due to arthritis and patient has failed non-surgical conservative treatments for greater than 12 weeks to include NSAID's and/or analgesics, corticosteriod injections, supervised PT with diminished ADL's post treatment, use of assistive devices and activity modification.  Onset of symptoms was gradual starting 5 years ago with gradually worsening course since that time.The patient noted no past surgery on the right hip(s).  Patient currently rates pain in the right hip at 10 out of 10 with activity. Patient has worsening of pain with activity and weight bearing, trendelenberg gait, pain that interfers with activities of daily living and pain with passive range of motion. Patient has evidence of periarticular osteophytes and joint space narrowing by imaging studies. This condition presents safety issues increasing the risk of falls. There is no current active infection.  Risks, benefits and expectations were discussed with the patient. Patient understand the risks, benefits and expectations and wishes to proceed with surgery.   D/C Plans:  Home with HHPT  Post-op Meds:   Rx given for ASA, Robaxin, Iron, Colace and MiraLax  Tranexamic Acid:  To be given  Decadron:   Not to be given - DM  FYI:  Has had gastric bypass  Patient Active Problem List   Diagnosis Date Noted  . Small bowel obstruction 04/11/2011  . Incisional hernia with obstruction 04/11/2011  . Pelvic mass 03/29/2011  . Radicular low back pain 11/03/2010  . Hip arthritis 11/03/2010   Past Medical History  Diagnosis Date  . HTN (hypertension)   . Anemia   . HTN (hypertension)   . Arthritis   . Diabetes mellitus     diet controlled    Past Surgical History  Procedure Date   . Cholecystectomy 1997  . Gastric bypass   . Laparoscopic salpingoopherectomy   . Incisional hernia repair 04/11/11  . Laparotomy 04/11/2011    Procedure: EXPLORATORY LAPAROTOMY;  Surgeon: Clovis Pu. Cornett, MD;  Location: [facility] ORS;  Service: General;  Laterality: N/A;  closure port hole     No Known Allergies   History  Substance Use Topics  . Smoking status: Former Smoker -- 1.0 packs/day for 12 years    Types: Cigarettes    Quit date: 03/20/1976  . Smokeless tobacco: Never Used  . Alcohol Use: No    Family History  Problem Relation Age of Onset  . Arthritis    . Cancer    . Breast cancer Mother   . Hypertension Father   . Diabetes Father      Review of Systems  Constitutional: Negative.   HENT: Negative.   Eyes: Negative.   Respiratory: Negative.   Cardiovascular: Negative.   Gastrointestinal: Negative.   Genitourinary: Positive for frequency.  Musculoskeletal: Positive for myalgias, back pain and joint pain.  Skin: Negative.   Neurological: Negative.   Endo/Heme/Allergies: Negative.   Psychiatric/Behavioral: Negative.     Objective:  Physical Exam  Constitutional: She is oriented to person, place, and time. She appears well-developed and well-nourished.  HENT:  Head: Normocephalic and atraumatic.  Mouth/Throat: Oropharynx is clear and moist.  Eyes: Pupils are equal, round, and reactive to light.  Neck: Neck supple. No JVD present. No tracheal deviation present. No thyromegaly present.  Cardiovascular: Normal rate, regular rhythm, normal heart sounds and  intact distal pulses.   Respiratory: Effort normal and breath sounds normal. No stridor. No respiratory distress. She has no wheezes.  GI: Soft. There is no tenderness. There is no guarding.  Musculoskeletal:       Right hip: She exhibits decreased range of motion, decreased strength, tenderness and bony tenderness. She exhibits no swelling, no deformity and no laceration.  Lymphadenopathy:    She has no  cervical adenopathy.  Neurological: She is alert and oriented to person, place, and time.  Skin: Skin is warm and dry.  Psychiatric: She has a normal mood and affect.    Labs:  Estimated Body mass index is 46.13 kg/(m^2) as calculated from the following:   Height as of 05/23/11: 5\' 2" (1.575 m).   Weight as of 05/23/11: 252 lb 3.2 oz(114.397 kg).   Imaging Review Plain radiographs demonstrate severe degenerative joint disease of the right hip(s). The bone quality appears to be good for age and reported activity level.  Assessment/Plan:  End stage arthritis, right hip(s)  The patient history, physical examination, clinical judgement of the provider and imaging studies are consistent with end stage degenerative joint disease of the right hip(s) and total hip arthroplasty is deemed medically necessary. The treatment options including medical management, injection therapy, arthroscopy and arthroplasty were discussed at length. The risks and benefits of total hip arthroplasty were presented and reviewed. The risks due to aseptic loosening, infection, stiffness, dislocation/subluxation,  thromboembolic complications and other imponderables were discussed.  The patient acknowledged the explanation, agreed to proceed with the plan and consent was signed. Patient is being admitted for inpatient treatment for surgery, pain control, PT, OT, prophylactic antibiotics, VTE prophylaxis, progressive ambulation and ADL's and discharge planning.The patient is planning to be discharged home with home health services.     Anastasio Auerbach Denise [name]   PAC  03/06/2012, 5:39 PM

## 2012-03-06 NOTE — Pre-Procedure Instructions (Signed)
Results of pt's HGB A 1 C faxed to Dr. Reece Agar. Hill's office.

## 2012-03-07 ENCOUNTER — Encounter (HOSPITAL_COMMUNITY): Payer: Self-pay | Admitting: Anesthesiology

## 2012-03-07 ENCOUNTER — Ambulatory Visit (HOSPITAL_COMMUNITY): Payer: PRIVATE HEALTH INSURANCE

## 2012-03-07 ENCOUNTER — Encounter (HOSPITAL_COMMUNITY)
Admission: RE | Disposition: A | Discharge status: Home Health | Payer: Self-pay | Source: Ambulatory Visit | Attending: Orthopedic Surgery

## 2012-03-07 ENCOUNTER — Ambulatory Visit (HOSPITAL_COMMUNITY): Payer: PRIVATE HEALTH INSURANCE | Admitting: Anesthesiology

## 2012-03-07 ENCOUNTER — Encounter (HOSPITAL_COMMUNITY): Payer: Self-pay | Admitting: *Deleted

## 2012-03-07 ENCOUNTER — Inpatient Hospital Stay (HOSPITAL_COMMUNITY)
Admission: RE | Admit: 2012-03-07 | Discharge: 2012-03-08 | DRG: 470 | Disposition: A | Discharge status: Home Health | Payer: PRIVATE HEALTH INSURANCE | Source: Ambulatory Visit | Attending: Orthopedic Surgery | Admitting: Orthopedic Surgery

## 2012-03-07 DIAGNOSIS — M169 Osteoarthritis of hip, unspecified: Principal | ICD-10-CM | POA: Diagnosis present

## 2012-03-07 DIAGNOSIS — D500 Iron deficiency anemia secondary to blood loss (chronic): Secondary | ICD-10-CM

## 2012-03-07 DIAGNOSIS — M1610 Unilateral primary osteoarthritis, unspecified hip: Principal | ICD-10-CM | POA: Diagnosis present

## 2012-03-07 DIAGNOSIS — I10 Essential (primary) hypertension: Secondary | ICD-10-CM | POA: Diagnosis present

## 2012-03-07 DIAGNOSIS — Z6841 Body Mass Index (BMI) 40.0 and over, adult: Secondary | ICD-10-CM

## 2012-03-07 DIAGNOSIS — Z01812 Encounter for preprocedural laboratory examination: Secondary | ICD-10-CM

## 2012-03-07 DIAGNOSIS — E119 Type 2 diabetes mellitus without complications: Secondary | ICD-10-CM | POA: Diagnosis present

## 2012-03-07 DIAGNOSIS — D62 Acute posthemorrhagic anemia: Secondary | ICD-10-CM | POA: Diagnosis not present

## 2012-03-07 DIAGNOSIS — Z96649 Presence of unspecified artificial hip joint: Secondary | ICD-10-CM

## 2012-03-07 HISTORY — PX: TOTAL HIP ARTHROPLASTY: SHX124

## 2012-03-07 LAB — GLUCOSE, CAPILLARY
Glucose-Capillary: 103 mg/dL — ABNORMAL HIGH (ref 70–99)
Glucose-Capillary: 152 mg/dL — ABNORMAL HIGH (ref 70–99)
Glucose-Capillary: 166 mg/dL — ABNORMAL HIGH (ref 70–99)
Glucose-Capillary: 147 mg/dL — ABNORMAL HIGH (ref 70–99)

## 2012-03-07 LAB — TYPE AND SCREEN
ABO/RH(D): O POS
Antibody Screen: NEGATIVE

## 2012-03-07 IMAGING — CR DG HIP 1V PORT*R*
1 series · 1 of 1 positions shown · non-contrast
Comparison: None.

CLINICAL DATA: Right total hip arthroplasty.

PORTABLE RIGHT HIP - 1 VIEW

[AP]
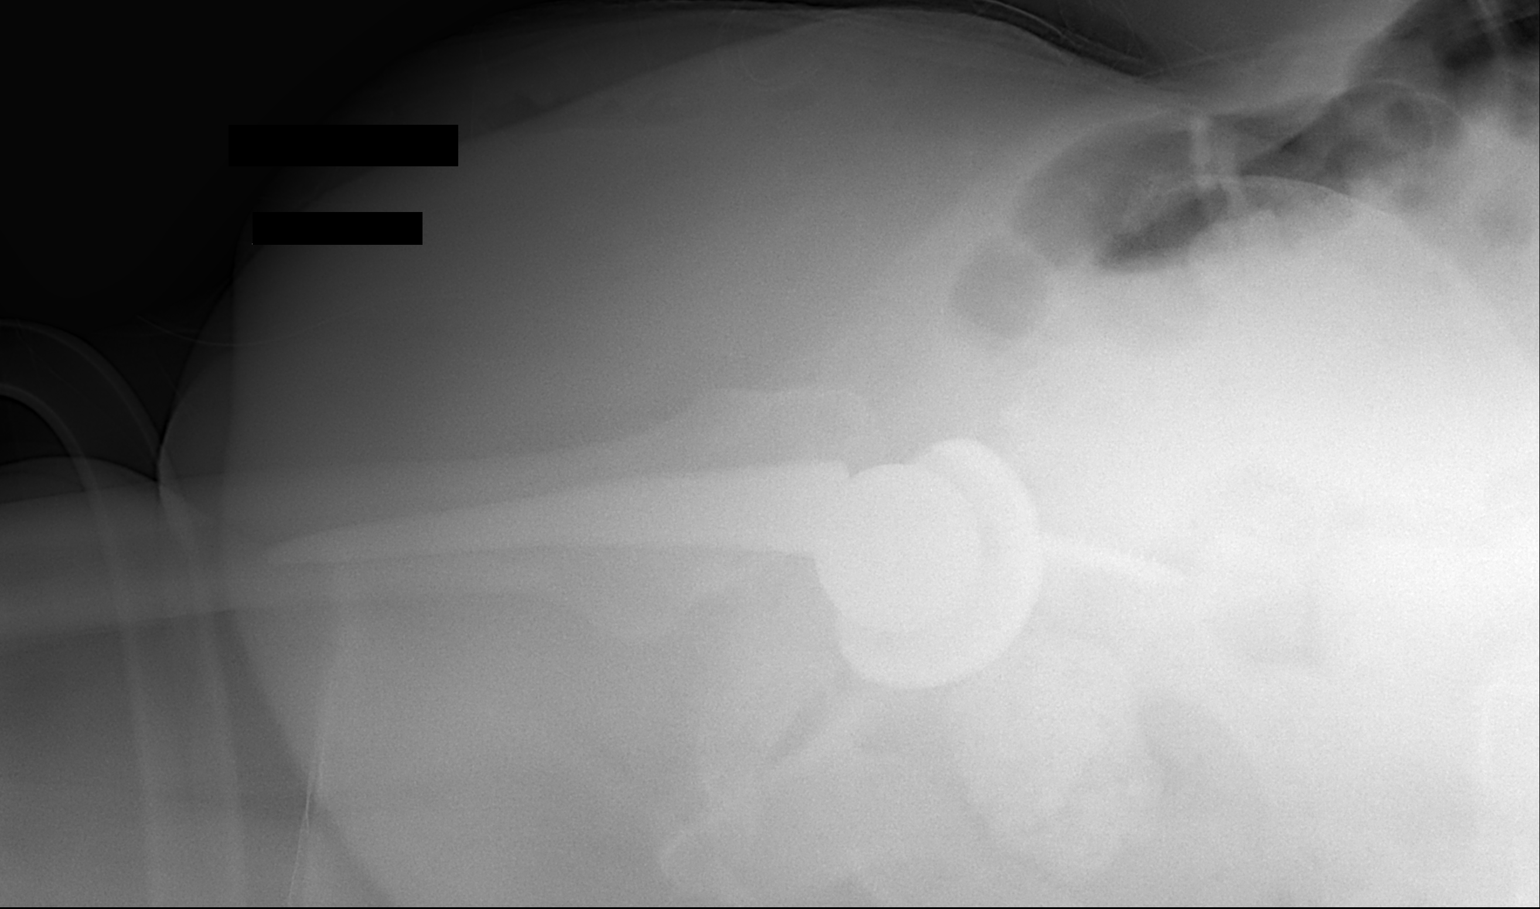

[1 of 1 positions shown; findings below may reference images not displayed]

FINDINGS: Lateral film demonstrates well seated components of a
total hip arthroplasty.  No complicating features.
IMPRESSION: Well seated components of a total right hip arthroplasty.

## 2012-03-07 IMAGING — CR DG PORTABLE PELVIS
1 series · 2 of 2 positions shown · non-contrast
Comparison: [DATE].

CLINICAL DATA: Right total hip arthroplasty.

PORTABLE PELVIS

[Series 1: AP · U · 2 of 2 slices shown]
[im 1/2]
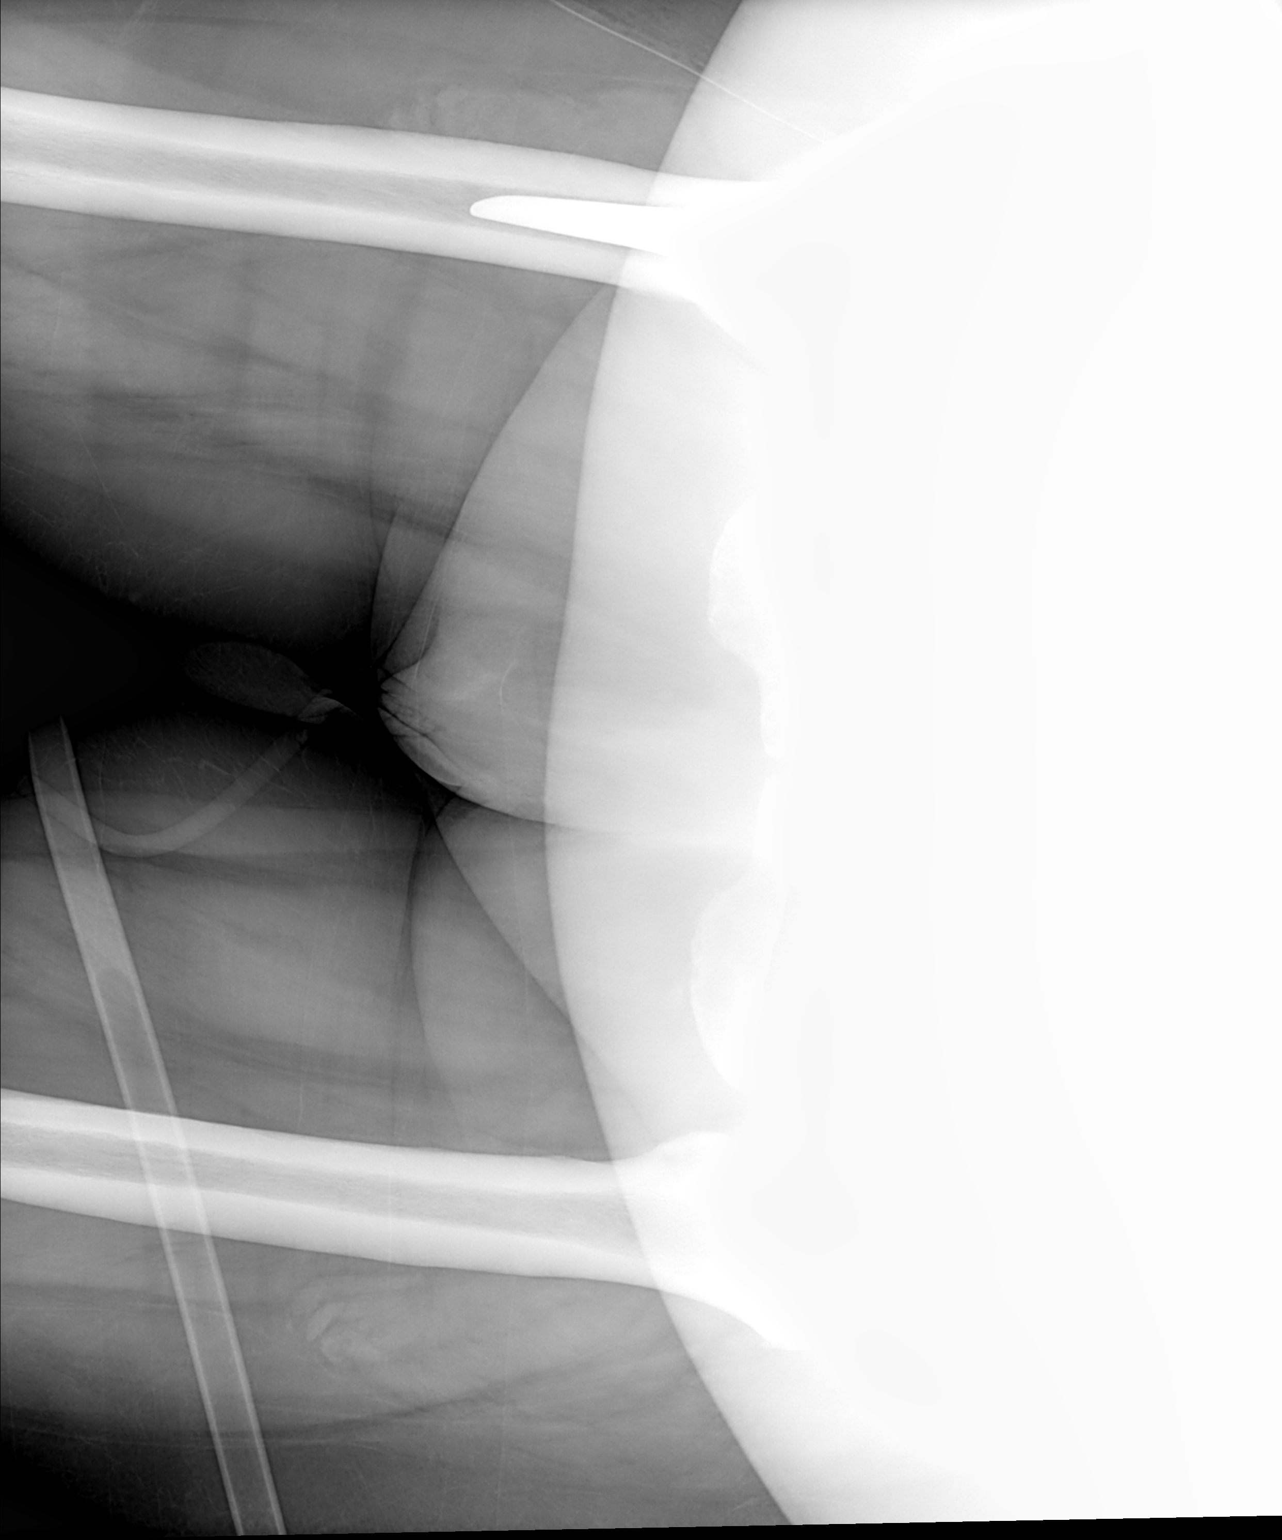
[im 2/2]
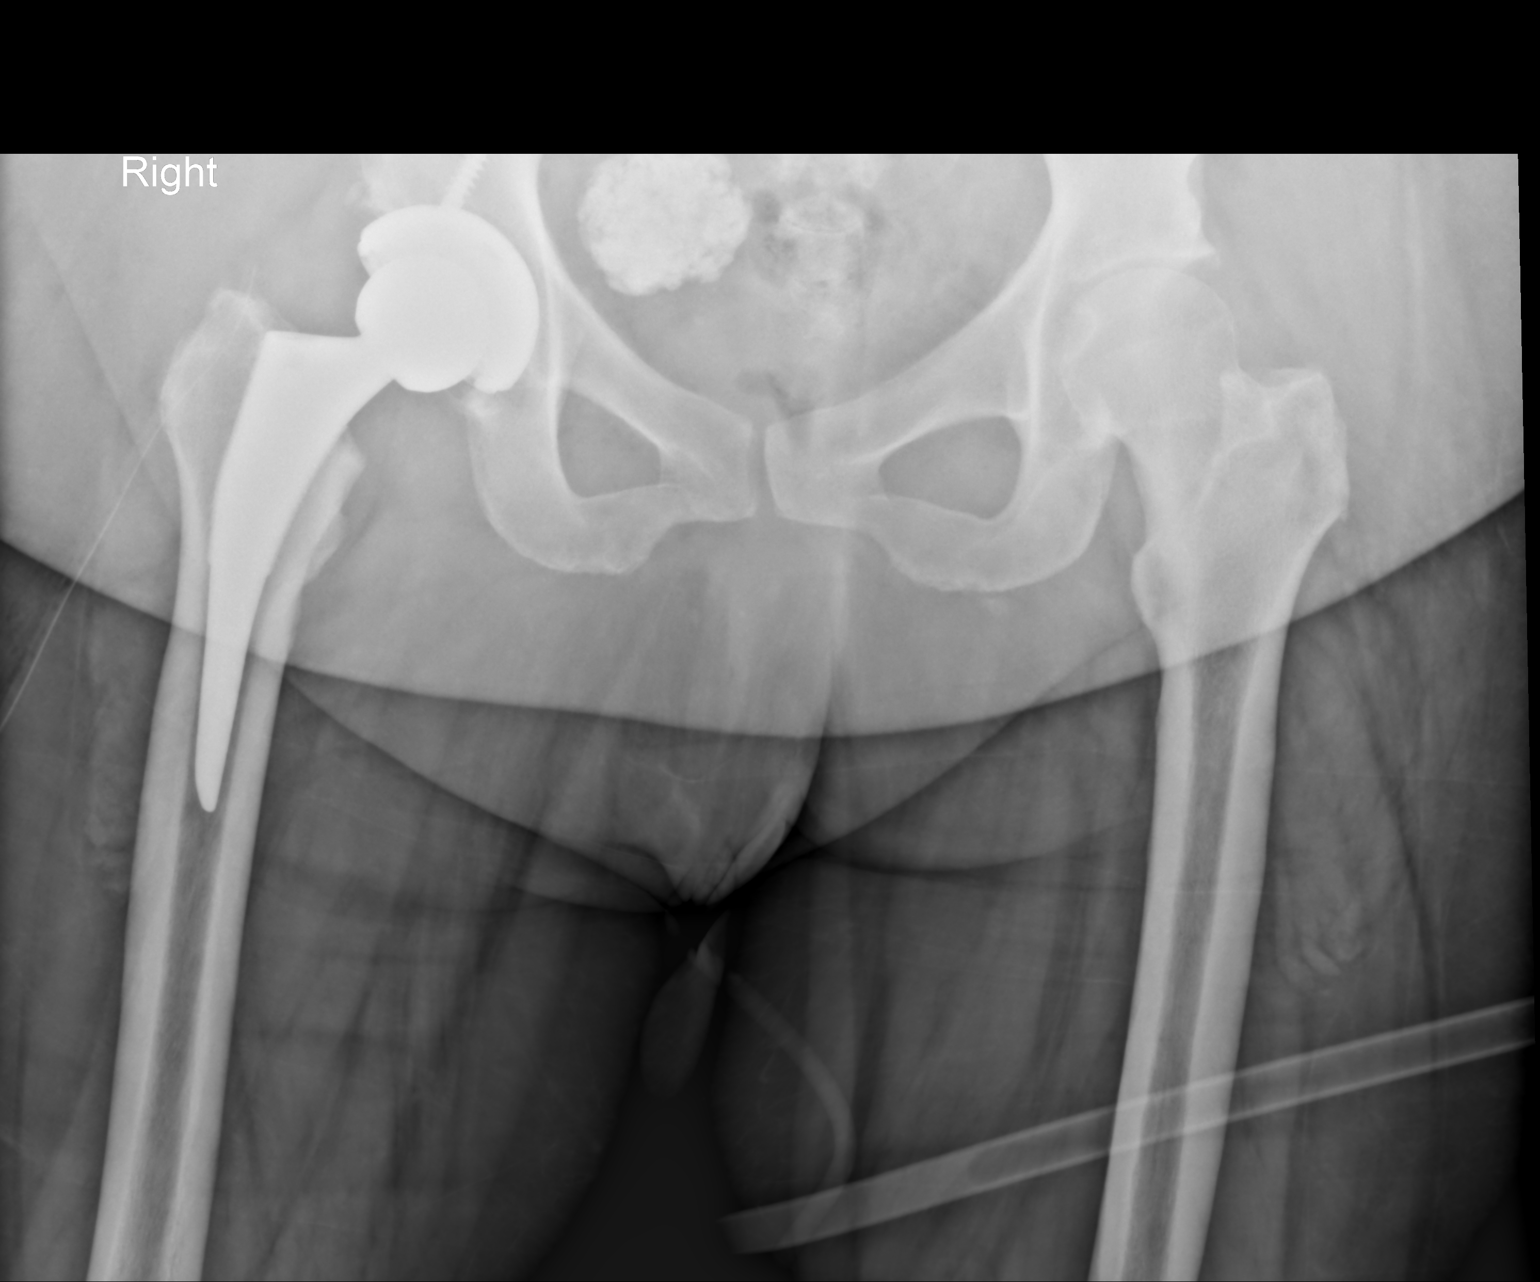

[2 of 2 positions shown; findings below may reference images not displayed]

FINDINGS: The femoral and acetabular components are well seated.
No complicating features.  The bony pelvis is intact.  A large
calcified fibroid is again demonstrated.
IMPRESSION: Well seated components of a right total hip arthroplasty.

## 2012-03-07 SURGERY — ARTHROPLASTY, HIP, TOTAL, ANTERIOR APPROACH
Anesthesia: Spinal | Site: Hip | Laterality: Right | Wound class: Clean

## 2012-03-07 MED ORDER — PROPOFOL 10 MG/ML IV BOLUS
INTRAVENOUS | Status: DC | PRN
  Administered 2012-03-07: 200 mg via INTRAVENOUS

## 2012-03-07 MED ORDER — MEPERIDINE HCL 50 MG/ML IJ SOLN: 6.2500 mg | INTRAMUSCULAR | Status: DC | PRN

## 2012-03-07 MED ORDER — INSULIN ASPART 100 UNIT/ML ~~LOC~~ SOLN
0.0000 [IU] | Freq: Three times a day (TID) | SUBCUTANEOUS | Status: DC
  Administered 2012-03-07: 3 [IU] via SUBCUTANEOUS
  Administered 2012-03-08: 2 [IU] via SUBCUTANEOUS

## 2012-03-07 MED ORDER — CEFAZOLIN SODIUM-DEXTROSE 2-3 GM-% IV SOLR
INTRAVENOUS | Status: AC
  Filled 2012-03-07: qty 50

## 2012-03-07 MED ORDER — FERROUS SULFATE 325 (65 FE) MG PO TABS
325.0000 mg | ORAL_TABLET | Freq: Three times a day (TID) | ORAL | Status: DC
  Administered 2012-03-08 (×2): 325 mg via ORAL
  Filled 2012-03-07 (×5): qty 1

## 2012-03-07 MED ORDER — 0.9 % SODIUM CHLORIDE (POUR BTL) OPTIME
TOPICAL | Status: DC | PRN
  Administered 2012-03-07: 1000 mL

## 2012-03-07 MED ORDER — ACETAMINOPHEN 10 MG/ML IV SOLN
INTRAVENOUS | Status: DC | PRN
  Administered 2012-03-07: 1000 mg via INTRAVENOUS

## 2012-03-07 MED ORDER — HYDROMORPHONE HCL PF 1 MG/ML IJ SOLN
0.2500 mg | INTRAMUSCULAR | Status: DC | PRN
  Administered 2012-03-07 (×2): 0.5 mg via INTRAVENOUS

## 2012-03-07 MED ORDER — CALCIUM CARBONATE ANTACID 500 MG PO CHEW
1.0000 | CHEWABLE_TABLET | Freq: Two times a day (BID) | ORAL | Status: DC | PRN
  Filled 2012-03-07: qty 2

## 2012-03-07 MED ORDER — MENTHOL 3 MG MT LOZG
1.0000 | LOZENGE | OROMUCOSAL | Status: DC | PRN
  Filled 2012-03-07: qty 9

## 2012-03-07 MED ORDER — FLEET ENEMA 7-19 GM/118ML RE ENEM: 1.0000 | ENEMA | Freq: Once | RECTAL | Status: AC | PRN

## 2012-03-07 MED ORDER — KETAMINE HCL 10 MG/ML IJ SOLN
INTRAMUSCULAR | Status: DC | PRN
  Administered 2012-03-07: 25 mg via INTRAVENOUS
  Administered 2012-03-07 (×5): 2 mg via INTRAVENOUS
  Administered 2012-03-07: 4 mg via INTRAVENOUS
  Administered 2012-03-07: 2 mg via INTRAVENOUS

## 2012-03-07 MED ORDER — CEFAZOLIN SODIUM-DEXTROSE 2-3 GM-% IV SOLR
2.0000 g | INTRAVENOUS | Status: AC
  Administered 2012-03-07: 2 g via INTRAVENOUS

## 2012-03-07 MED ORDER — ZOLPIDEM TARTRATE 5 MG PO TABS: 5.0000 mg | ORAL_TABLET | Freq: Every evening | ORAL | Status: DC | PRN

## 2012-03-07 MED ORDER — CELECOXIB 200 MG PO CAPS
200.0000 mg | ORAL_CAPSULE | Freq: Two times a day (BID) | ORAL | Status: DC
  Administered 2012-03-07 – 2012-03-08 (×2): 200 mg via ORAL
  Filled 2012-03-07 (×3): qty 1

## 2012-03-07 MED ORDER — GABAPENTIN 100 MG PO CAPS
100.0000 mg | ORAL_CAPSULE | Freq: Two times a day (BID) | ORAL | Status: DC
  Administered 2012-03-08: 100 mg via ORAL
  Filled 2012-03-07 (×2): qty 1

## 2012-03-07 MED ORDER — ONDANSETRON HCL 4 MG/2ML IJ SOLN: 4.0000 mg | Freq: Four times a day (QID) | INTRAMUSCULAR | Status: DC | PRN

## 2012-03-07 MED ORDER — MIDAZOLAM HCL 5 MG/5ML IJ SOLN
INTRAMUSCULAR | Status: DC | PRN
  Administered 2012-03-07: 2 mg via INTRAVENOUS

## 2012-03-07 MED ORDER — CHLORHEXIDINE GLUCONATE 4 % EX LIQD
60.0000 mL | Freq: Once | CUTANEOUS | Status: DC
  Filled 2012-03-07: qty 60

## 2012-03-07 MED ORDER — HYDROMORPHONE HCL PF 1 MG/ML IJ SOLN
INTRAMUSCULAR | Status: DC | PRN
  Administered 2012-03-07 (×3): 1 mg via INTRAVENOUS

## 2012-03-07 MED ORDER — AMLODIPINE BESYLATE 5 MG PO TABS
5.0000 mg | ORAL_TABLET | Freq: Every morning | ORAL | Status: DC
  Administered 2012-03-08: 5 mg via ORAL
  Filled 2012-03-07: qty 1

## 2012-03-07 MED ORDER — ESMOLOL HCL 10 MG/ML IV SOLN
INTRAVENOUS | Status: DC | PRN
  Administered 2012-03-07: 10 mg via INTRAVENOUS

## 2012-03-07 MED ORDER — ALUM & MAG HYDROXIDE-SIMETH 200-200-20 MG/5ML PO SUSP: 30.0000 mL | ORAL | Status: DC | PRN

## 2012-03-07 MED ORDER — DOCUSATE SODIUM 100 MG PO CAPS
100.0000 mg | ORAL_CAPSULE | Freq: Two times a day (BID) | ORAL | Status: DC
  Administered 2012-03-07 – 2012-03-08 (×2): 100 mg via ORAL

## 2012-03-07 MED ORDER — POLYVINYL ALCOHOL 1.4 % OP SOLN
1.0000 [drp] | Freq: Three times a day (TID) | OPHTHALMIC | Status: DC
  Administered 2012-03-08: 1 [drp] via OPHTHALMIC
  Filled 2012-03-07: qty 15

## 2012-03-07 MED ORDER — HYDROMORPHONE HCL PF 1 MG/ML IJ SOLN
INTRAMUSCULAR | Status: AC
  Filled 2012-03-07: qty 1

## 2012-03-07 MED ORDER — METOCLOPRAMIDE HCL 10 MG PO TABS
5.0000 mg | ORAL_TABLET | Freq: Three times a day (TID) | ORAL | Status: DC | PRN
Start: 2012-03-07 — End: 2012-03-08

## 2012-03-07 MED ORDER — DEXTROSE 5 % IV SOLN
500.0000 mg | Freq: Four times a day (QID) | INTRAVENOUS | Status: DC | PRN
  Administered 2012-03-07: 500 mg via INTRAVENOUS
  Filled 2012-03-07: qty 5

## 2012-03-07 MED ORDER — POLYETHYLENE GLYCOL 3350 17 G PO PACK
17.0000 g | PACK | Freq: Two times a day (BID) | ORAL | Status: DC
  Administered 2012-03-07 – 2012-03-08 (×2): 17 g via ORAL

## 2012-03-07 MED ORDER — LABETALOL HCL 5 MG/ML IV SOLN
INTRAVENOUS | Status: DC | PRN
  Administered 2012-03-07: 5 mg via INTRAVENOUS
  Administered 2012-03-07: 10 mg via INTRAVENOUS
  Administered 2012-03-07 (×2): 5 mg via INTRAVENOUS

## 2012-03-07 MED ORDER — NEOSTIGMINE METHYLSULFATE 1 MG/ML IJ SOLN
INTRAMUSCULAR | Status: DC | PRN
  Administered 2012-03-07: 5 mg via INTRAVENOUS

## 2012-03-07 MED ORDER — RIVAROXABAN 10 MG PO TABS
10.0000 mg | ORAL_TABLET | ORAL | Status: DC
  Administered 2012-03-08: 10 mg via ORAL
  Filled 2012-03-07 (×2): qty 1

## 2012-03-07 MED ORDER — HYDROMORPHONE HCL PF 1 MG/ML IJ SOLN: 0.5000 mg | INTRAMUSCULAR | Status: DC | PRN

## 2012-03-07 MED ORDER — PHENOL 1.4 % MT LIQD
1.0000 | OROMUCOSAL | Status: DC | PRN
  Filled 2012-03-07: qty 177

## 2012-03-07 MED ORDER — POLYETHYL GLYCOL-PROPYL GLYCOL 0.4-0.3 % OP SOLN: 1.0000 [drp] | Freq: Three times a day (TID) | OPHTHALMIC | Status: DC

## 2012-03-07 MED ORDER — DIPHENHYDRAMINE HCL 25 MG PO CAPS: 25.0000 mg | ORAL_CAPSULE | Freq: Four times a day (QID) | ORAL | Status: DC | PRN

## 2012-03-07 MED ORDER — BISACODYL 10 MG RE SUPP: 10.0000 mg | Freq: Every day | RECTAL | Status: DC | PRN

## 2012-03-07 MED ORDER — CEFAZOLIN SODIUM-DEXTROSE 2-3 GM-% IV SOLR
2.0000 g | Freq: Four times a day (QID) | INTRAVENOUS | Status: AC
  Administered 2012-03-07 – 2012-03-08 (×2): 2 g via INTRAVENOUS
  Filled 2012-03-07 (×2): qty 50

## 2012-03-07 MED ORDER — ACETAMINOPHEN 10 MG/ML IV SOLN
INTRAVENOUS | Status: AC
  Filled 2012-03-07: qty 100

## 2012-03-07 MED ORDER — METOCLOPRAMIDE HCL 5 MG/ML IJ SOLN: 5.0000 mg | Freq: Three times a day (TID) | INTRAMUSCULAR | Status: DC | PRN

## 2012-03-07 MED ORDER — HYDROCODONE-ACETAMINOPHEN 7.5-325 MG PO TABS
1.0000 | ORAL_TABLET | ORAL | Status: DC
  Administered 2012-03-07: 1 via ORAL
  Administered 2012-03-07: 2 via ORAL
  Administered 2012-03-08 (×4): 1 via ORAL
  Filled 2012-03-07: qty 2
  Filled 2012-03-07: qty 1
  Filled 2012-03-07: qty 2
  Filled 2012-03-07 (×3): qty 1

## 2012-03-07 MED ORDER — OXYCODONE HCL 5 MG PO TABS: 5.0000 mg | ORAL_TABLET | Freq: Once | ORAL | Status: DC | PRN

## 2012-03-07 MED ORDER — GLYCOPYRROLATE 0.2 MG/ML IJ SOLN
INTRAMUSCULAR | Status: DC | PRN
  Administered 2012-03-07: 0.6 mg via INTRAVENOUS

## 2012-03-07 MED ORDER — ACETAMINOPHEN 10 MG/ML IV SOLN: 1000.0000 mg | Freq: Once | INTRAVENOUS | Status: DC | PRN

## 2012-03-07 MED ORDER — PHENYLEPHRINE HCL 10 MG/ML IJ SOLN
INTRAMUSCULAR | Status: DC | PRN
  Administered 2012-03-07: 80 ug via INTRAVENOUS

## 2012-03-07 MED ORDER — OXYCODONE HCL 5 MG/5ML PO SOLN
5.0000 mg | Freq: Once | ORAL | Status: DC | PRN
  Filled 2012-03-07: qty 5

## 2012-03-07 MED ORDER — ROCURONIUM BROMIDE 100 MG/10ML IV SOLN
INTRAVENOUS | Status: DC | PRN
  Administered 2012-03-07: 50 mg via INTRAVENOUS
  Administered 2012-03-07: 10 mg via INTRAVENOUS

## 2012-03-07 MED ORDER — FENTANYL CITRATE 0.05 MG/ML IJ SOLN
INTRAMUSCULAR | Status: DC | PRN
  Administered 2012-03-07: 25 ug via INTRAVENOUS
  Administered 2012-03-07 (×3): 50 ug via INTRAVENOUS
  Administered 2012-03-07: 25 ug via INTRAVENOUS
  Administered 2012-03-07 (×3): 50 ug via INTRAVENOUS

## 2012-03-07 MED ORDER — GABAPENTIN 300 MG PO CAPS
600.0000 mg | ORAL_CAPSULE | Freq: Every day | ORAL | Status: DC
  Administered 2012-03-07: 600 mg via ORAL
  Filled 2012-03-07 (×2): qty 2

## 2012-03-07 MED ORDER — SODIUM CHLORIDE 0.9 % IV SOLN
100.0000 mL/h | INTRAVENOUS | Status: DC
  Administered 2012-03-07 – 2012-03-08 (×2): 100 mL/h via INTRAVENOUS
  Filled 2012-03-07 (×5): qty 1000

## 2012-03-07 MED ORDER — METHOCARBAMOL 500 MG PO TABS: 500.0000 mg | ORAL_TABLET | Freq: Four times a day (QID) | ORAL | Status: DC | PRN

## 2012-03-07 MED ORDER — LACTATED RINGERS IV SOLN
INTRAVENOUS | Status: DC
  Administered 2012-03-07 (×3): via INTRAVENOUS

## 2012-03-07 MED ORDER — TRANEXAMIC ACID 100 MG/ML IV SOLN
1680.0000 mg | Freq: Once | INTRAVENOUS | Status: AC
  Administered 2012-03-07: 1680 mg via INTRAVENOUS
  Filled 2012-03-07: qty 16.8

## 2012-03-07 MED ORDER — METOCLOPRAMIDE HCL 5 MG/ML IJ SOLN
INTRAMUSCULAR | Status: DC | PRN
  Administered 2012-03-07: 10 mg via INTRAVENOUS

## 2012-03-07 MED ORDER — PROMETHAZINE HCL 25 MG/ML IJ SOLN
6.2500 mg | INTRAMUSCULAR | Status: DC | PRN
Start: 2012-03-07 — End: 2012-03-07

## 2012-03-07 MED ORDER — ONDANSETRON HCL 4 MG PO TABS: 4.0000 mg | ORAL_TABLET | Freq: Four times a day (QID) | ORAL | Status: DC | PRN

## 2012-03-07 SURGICAL SUPPLY — 43 items
ADH SKN CLS APL DERMABOND .7 (GAUZE/BANDAGES/DRESSINGS) ×1
BAG SPEC THK2 15X12 ZIP CLS (MISCELLANEOUS) ×2
BAG ZIPLOCK 12X15 (MISCELLANEOUS) ×4 IMPLANT
BLADE SAW SGTL 18X1.27X75 (BLADE) ×2 IMPLANT
CELLS DAT CNTRL 66122 CELL SVR (MISCELLANEOUS) ×1 IMPLANT
CLOTH BEACON ORANGE TIMEOUT ST (SAFETY) ×2 IMPLANT
DERMABOND ADVANCED (GAUZE/BANDAGES/DRESSINGS) ×1
DERMABOND ADVANCED .7 DNX12 (GAUZE/BANDAGES/DRESSINGS) ×1 IMPLANT
DRAPE C-ARM 42X72 X-RAY (DRAPES) ×2 IMPLANT
DRAPE STERI IOBAN 125X83 (DRAPES) ×2 IMPLANT
DRAPE U-SHAPE 47X51 STRL (DRAPES) ×6 IMPLANT
DRSG AQUACEL AG ADV 3.5X10 (GAUZE/BANDAGES/DRESSINGS) ×2 IMPLANT
DRSG TEGADERM 4X4.75 (GAUZE/BANDAGES/DRESSINGS) ×1 IMPLANT
DURAPREP 26ML APPLICATOR (WOUND CARE) ×2 IMPLANT
ELECT BLADE TIP CTD 4 INCH (ELECTRODE) ×2 IMPLANT
ELECT REM PT RETURN 9FT ADLT (ELECTROSURGICAL) ×2
ELECTRODE REM PT RTRN 9FT ADLT (ELECTROSURGICAL) ×1 IMPLANT
EVACUATOR 1/8 PVC DRAIN (DRAIN) IMPLANT
FACESHIELD LNG OPTICON STERILE (SAFETY) ×8 IMPLANT
GAUZE SPONGE 2X2 8PLY STRL LF (GAUZE/BANDAGES/DRESSINGS) ×1 IMPLANT
GLOVE BIOGEL PI IND STRL 7.5 (GLOVE) ×1 IMPLANT
GLOVE BIOGEL PI IND STRL 8 (GLOVE) ×1 IMPLANT
GLOVE BIOGEL PI INDICATOR 7.5 (GLOVE) ×1
GLOVE BIOGEL PI INDICATOR 8 (GLOVE) ×1
GLOVE ECLIPSE 8.0 STRL XLNG CF (GLOVE) ×2 IMPLANT
GLOVE ORTHO TXT STRL SZ7.5 (GLOVE) ×4 IMPLANT
GOWN BRE IMP PREV XXLGXLNG (GOWN DISPOSABLE) ×4 IMPLANT
GOWN STRL NON-REIN LRG LVL3 (GOWN DISPOSABLE) ×2 IMPLANT
KIT BASIN OR (CUSTOM PROCEDURE TRAY) ×2 IMPLANT
PACK TOTAL JOINT (CUSTOM PROCEDURE TRAY) ×2 IMPLANT
PADDING CAST COTTON 6X4 STRL (CAST SUPPLIES) ×2 IMPLANT
RETRACTOR WND ALEXIS 18 MED (MISCELLANEOUS) IMPLANT
RTRCTR WOUND ALEXIS 18CM MED (MISCELLANEOUS) ×2
SPONGE GAUZE 2X2 STER 10/PKG (GAUZE/BANDAGES/DRESSINGS) ×1
SUCTION FRAZIER 12FR DISP (SUCTIONS) ×1 IMPLANT
SUT MNCRL AB 4-0 PS2 18 (SUTURE) ×2 IMPLANT
SUT VIC AB 1 CT1 36 (SUTURE) ×7 IMPLANT
SUT VIC AB 2-0 CT1 27 (SUTURE) ×4
SUT VIC AB 2-0 CT1 TAPERPNT 27 (SUTURE) ×2 IMPLANT
SUT VLOC 180 0 24IN GS25 (SUTURE) ×2 IMPLANT
TOWEL OR 17X26 10 PK STRL BLUE (TOWEL DISPOSABLE) ×4 IMPLANT
TRAY FOLEY CATH 14FRSI W/METER (CATHETERS) ×2 IMPLANT
WATER STERILE IRR 1500ML POUR (IV SOLUTION) ×2 IMPLANT

## 2012-03-07 NOTE — Anesthesia Preprocedure Evaluation (Addendum)
Anesthesia Evaluation  Patient identified by MRN, date of birth, ID band Patient awake    Reviewed: Allergy & Precautions, H&P , NPO status , Patient's Chart, lab work & pertinent test results  Airway Mallampati: II TM Distance: >3 FB Neck ROM: Full    Dental  (+) Dental Advisory Given, Edentulous Upper and Edentulous Lower   Pulmonary former smoker,  breath sounds clear to auscultation  Pulmonary exam normal       Cardiovascular hypertension, Pt. on medications Rhythm:Regular Rate:Normal     Neuro/Psych negative neurological ROS  negative psych ROS   GI/Hepatic negative GI ROS, Neg liver ROS,   Endo/Other  diabetes, Type obesity  Renal/GU negative Renal ROS     Musculoskeletal negative musculoskeletal ROS (+)   Abdominal   Peds  Hematology negative hematology ROS (+)   Anesthesia Other Findings   Reproductive/Obstetrics                          Anesthesia Physical Anesthesia Plan  ASA: III  Anesthesia Plan: General   Post-op Pain Management:    Induction: Intravenous  Airway Management Planned: Oral ETT  Additional Equipment:   Intra-op Plan:   Post-operative Plan: Extubation in OR  Informed Consent: I have reviewed the patients History and Physical, chart, labs and discussed the procedure including the risks, benefits and alternatives for the proposed anesthesia with the patient or authorized representative who has indicated his/her understanding and acceptance.   Dental advisory given  Plan Discussed with: CRNA  Anesthesia Plan Comments:        Anesthesia Quick Evaluation

## 2012-03-07 NOTE — Op Note (Signed)
NAME:  Denise [name]                ACCOUNT NO.: [id number]      MEDICAL RECORD NO.: [id number]      FACILITY:  [facility]      PHYSICIAN:  Durene Romans D  DATE OF BIRTH:  January 06, 1951     DATE OF PROCEDURE:  03/07/2012                                 OPERATIVE REPORT         PREOPERATIVE DIAGNOSIS: Right  hip osteoarthritis.      POSTOPERATIVE DIAGNOSIS:  Right hip osteoarthritis.      PROCEDURE:  Right total hip replacement through an anterior approach   utilizing DePuy THR system, component size 50mm pinnacle cup, a size 32+4 neutral   Altrex liner, a size 0 Hi Tri Lock stem with a 32+1 delta ceramic   ball.      SURGEON:  Madlyn Frankel. Charlann Boxer, M.D.      ASSISTANT:  Lanney Gins, PA      ANESTHESIA:  General.      SPECIMENS:  None.      COMPLICATIONS:  None.      BLOOD LOSS:  450 cc     DRAINS:  One Hemovac.      INDICATION OF THE PROCEDURE:  Denise [name] is a 61 y.o. female who had   presented to office for evaluation of right hip pain.  Radiographs revealed   progressive degenerative changes with bone-on-bone   articulation to the  hip joint.  The patient had painful limited range of   motion significantly affecting their overall quality of life.  The patient was failing to    respond to conservative measures, and at this point was ready   to proceed with more definitive measures.  The patient has noted progressive   degenerative changes in his hip, progressive problems and dysfunction   with regarding the hip prior to surgery.  Consent was obtained for   benefit of pain relief.  Specific risk of infection, DVT, component   failure, dislocation, need for revision surgery, as well discussion of   the anterior versus posterior approach were reviewed.  Consent was   obtained for benefit of anterior pain relief through an anterior   approach.      PROCEDURE IN DETAIL:  The patient was brought to operative theater.   Once adequate anesthesia,  preoperative antibiotics, 2gm Ancef administered.   The patient was positioned supine on the OSI Hanna table.  Once adequate   padding of boney process was carried out, we had predraped out the hip, and  used fluoroscopy to confirm orientation of the pelvis and position.      The right hip was then prepped and draped from proximal iliac crest to   mid thigh with shower curtain technique.      Time-out was performed identifying the patient, planned procedure, and   extremity.     An incision was then made 2 cm distal and lateral to the   anterior superior iliac spine extending over the orientation of the   tensor fascia lata muscle and sharp dissection was carried down to the   fascia of the muscle and protractor placed in the soft tissues.      The fascia was then incised.  The muscle belly was identified and swept  laterally and retractor placed along the superior neck.  Following   cauterization of the circumflex vessels and removing some pericapsular   fat, a second cobra retractor was placed on the inferior neck.  A third   retractor was placed on the anterior acetabulum after elevating the   anterior rectus.  A L-capsulotomy was along the line of the   superior neck to the trochanteric fossa, then extended proximally and   distally.  Tag sutures were placed and the retractors were then placed   intracapsular.  We then identified the trochanteric fossa and   orientation of my neck cut, confirmed this radiographically   and then made a neck osteotomy with the femur on traction.  The femoral   head was removed without difficulty or complication.  Traction was let   off and retractors were placed posterior and anterior around the   acetabulum.      The labrum and foveal tissue were debrided.  I began reaming with a 45mm   reamer and reamed up to 49mm reamer with good bony bed preparation and a 50   cup was chosen.  The final 50mm Pinnacle cup was then impacted under fluoroscopy  to  confirm the depth of penetration and orientation with respect to   abduction.  A screw was placed followed by the hole eliminator.  The final   32+4 neutral Altrex liner was impacted with good visualized rim fit.  The cup was positioned anatomically within the acetabular portion of the pelvis.      At this point, the femur was rolled at 80 degrees.  Further capsule was   released off the inferior aspect of the femoral neck.  I then   released the superior capsule proximally.  The hook was placed laterally   along the femur and elevated manually and held in position with the bed   hook.  The leg was then extended and adducted with the leg rolled to 100   degrees of external rotation.  Once the proximal femur was fully   exposed, I used a box osteotome to set orientation.  I then began   broaching with the starting chili pepper broach and passed this by hand and then broached up to only zero.  With the zero broach in place I chose a high offset neck and did a trial reduction with the 32+1 ball.  The offset was appropriate, leg lengths   appeared to be equal, confirmed radiographically.   Given these findings, I went ahead and dislocated the hip, repositioned all   retractors and positioned the right hip in the extended and abducted position.  The final 0 Hi Tri Lock stem was   chosen and it was impacted down to the level of neck cut.  Based on this   and the trial reduction, a 32+1 delta ceramic ball was chosen and   impacted onto a clean and dry trunnion, and the hip was reduced.  The   hip had been irrigated throughout the case again at this point.  I did   reapproximate the superior capsular leaflet to the anterior leaflet   using #1 Vicryl, placed a medium Hemovac drain deep.  The fascia of the   tensor fascia lata muscle was then reapproximated using #1 Vicryl.  The   remaining wound was closed with 2-0 Vicryl and running 4-0 Monocryl.   The hip was cleaned, dried, and dressed sterilely  using Dermabond and   Aquacel dressing.  Drain site  dressed separately.  She was then brought   to recovery room in stable condition tolerating the procedure well.    Lanney Gins, PA-C was present for the entirety of the case involved from   preoperative positioning, perioperative retractor management, general   facilitation of the case, as well as primary wound closure as assistant.            Madlyn Frankel Charlann Boxer, M.D.            MDO/MEDQ  D:  01/10/2011  T:  01/10/2011  Job:  [id number]      Electronically Signed by Durene Romans M.D. on 01/16/2011 09:15:38 AM

## 2012-03-07 NOTE — Transfer of Care (Signed)
Immediate Anesthesia Transfer of Care Note  Patient: Denise [name]  Procedure(s) Performed: Procedure(s) (LRB) with comments: TOTAL HIP ARTHROPLASTY ANTERIOR APPROACH (Right)  Patient Location: PACU  Anesthesia Type:General  Level of Consciousness: awake, sedated, patient cooperative and responds to stimulation  Airway & Oxygen Therapy: Patient Spontanous Breathing and Patient connected to face mask oxygen  Post-op Assessment: Report given to PACU RN, Post -op Vital signs reviewed and stable and Patient moving all extremities  Post vital signs: Reviewed and stable  Complications: No apparent anesthesia complications

## 2012-03-07 NOTE — Interval H&P Note (Signed)
History and Physical Interval Note:  03/07/2012 11:17 AM  Denise [name]  has presented today for surgery, with the diagnosis of osteoarthritis of the right hip  The various methods of treatment have been discussed with the patient and family. After consideration of risks, benefits and other options for treatment, the patient has consented to  Procedure(s) (LRB) with comments: TOTAL HIP ARTHROPLASTY ANTERIOR APPROACH (Right) as a surgical intervention .  The patient's history has been reviewed, patient examined, no change in status, stable for surgery.  I have reviewed the patient's chart and labs.  Questions were answered to the patient's satisfaction.     Shelda Pal

## 2012-03-07 NOTE — Anesthesia Postprocedure Evaluation (Signed)
Anesthesia Post Note  Patient: Denise [name]  Procedure(s) Performed: Procedure(s) (LRB): TOTAL HIP ARTHROPLASTY ANTERIOR APPROACH (Right)  Anesthesia type: General  Patient location: PACU  Post pain: Pain level controlled  Post assessment: Post-op Vital signs reviewed  Last Vitals: BP 113/70  Pulse 67  Temp 36.9 C  Resp 13  Ht 5' 1.81" (1.57 m)  Wt 247 lb 12.8 oz (112.4 kg)  BMI 45.60 kg/m2  SpO2 97%  Post vital signs: Reviewed  Level of consciousness: sedated  Complications: No apparent anesthesia complications

## 2012-03-07 NOTE — Preoperative (Signed)
Beta Blockers   Reason not to administer Beta Blockers:Not Applicable, not on home BB 

## 2012-03-08 ENCOUNTER — Encounter (HOSPITAL_COMMUNITY): Payer: Self-pay | Admitting: Orthopedic Surgery

## 2012-03-08 DIAGNOSIS — D500 Iron deficiency anemia secondary to blood loss (chronic): Secondary | ICD-10-CM

## 2012-03-08 DIAGNOSIS — Z6841 Body Mass Index (BMI) 40.0 and over, adult: Secondary | ICD-10-CM

## 2012-03-08 LAB — GLUCOSE, CAPILLARY
Glucose-Capillary: 97 mg/dL (ref 70–99)
Glucose-Capillary: 136 mg/dL — ABNORMAL HIGH (ref 70–99)
Glucose-Capillary: 149 mg/dL — ABNORMAL HIGH (ref 70–99)

## 2012-03-08 LAB — BASIC METABOLIC PANEL
BUN: 8 mg/dL (ref 6–23)
CO2: 27 mEq/L (ref 19–32)
Calcium: 8.6 mg/dL (ref 8.4–10.5)
Chloride: 102 mEq/L (ref 96–112)
Creatinine, Ser: 0.76 mg/dL (ref 0.50–1.10)
GFR calc Af Amer: >90 mL/min (ref >90)
GFR calc non Af Amer: 90 mL/min — ABNORMAL LOW (ref >90)
Glucose, Bld: 117 mg/dL — ABNORMAL HIGH (ref 70–99)
Potassium: 3.9 mEq/L (ref 3.5–5.1)
Sodium: 137 mEq/L (ref 135–145)

## 2012-03-08 LAB — CBC
HCT: 29.3 % — ABNORMAL LOW (ref 36.0–46.0)
Hemoglobin: 9.3 g/dL — ABNORMAL LOW (ref 12.0–15.0)
MCH: 25.5 pg — ABNORMAL LOW (ref 26.0–34.0)
MCHC: 31.7 g/dL (ref 30.0–36.0)
MCV: 80.5 fL (ref 78.0–100.0)
Platelets: 335 10*3/uL (ref 150–400)
RBC: 3.64 MIL/uL — ABNORMAL LOW (ref 3.87–5.11)
RDW: 14.8 % (ref 11.5–15.5)
WBC: 9.6 10*3/uL (ref 4.0–10.5)

## 2012-03-08 MED ORDER — METHOCARBAMOL 500 MG PO TABS: 500.0000 mg | ORAL_TABLET | Freq: Four times a day (QID) | ORAL | Status: DC | PRN

## 2012-03-08 MED ORDER — DIPHENHYDRAMINE HCL 25 MG PO CAPS: 25.0000 mg | ORAL_CAPSULE | Freq: Four times a day (QID) | ORAL | Status: DC | PRN

## 2012-03-08 MED ORDER — ASPIRIN EC 325 MG PO TBEC: 325.0000 mg | DELAYED_RELEASE_TABLET | Freq: Two times a day (BID) | ORAL | Status: DC

## 2012-03-08 MED ORDER — DSS 100 MG PO CAPS: 100.0000 mg | ORAL_CAPSULE | Freq: Two times a day (BID) | ORAL | Status: DC

## 2012-03-08 MED ORDER — HYDROCODONE-ACETAMINOPHEN 7.5-325 MG PO TABS: 1.0000 | ORAL_TABLET | ORAL | Status: DC | PRN

## 2012-03-08 MED ORDER — POLYETHYLENE GLYCOL 3350 17 G PO PACK: 17.0000 g | PACK | Freq: Two times a day (BID) | ORAL | Status: DC

## 2012-03-08 MED ORDER — FERROUS SULFATE 325 (65 FE) MG PO TABS: 325.0000 mg | ORAL_TABLET | Freq: Three times a day (TID) | ORAL | Status: DC

## 2012-03-08 NOTE — Evaluation (Signed)
Physical Therapy Evaluation Patient Details Name: Denise [name] MRN: [id number] DOB: 10/28/1950 Today's Date: 03/08/2012 Time: 4098-1191 PT Time Calculation (min): 25 min  PT Assessment / Plan / Recommendation Clinical Impression  61 yo female s/p R THA-direct anterior-POD1. Mobilizing well. Moderate pain level with ambulation. Pt c/o feeling warm, lightheaded at start of session. Had nurse tech check blood surgar (136); BP 101/68. Plan is for d/c home today. Will check back later for 2nd session to practice steps.     PT Assessment  Patient needs continued PT services    Follow Up Recommendations  Home health PT    Does the patient have the potential to tolerate intense rehabilitation      Barriers to Discharge        Equipment Recommendations  Rolling walker with 5" wheels (wide.)    Recommendations for Other Services OT consult   Frequency 7X/week    Precautions / Restrictions Precautions Precautions: None Restrictions Weight Bearing Restrictions: No RLE Weight Bearing: Weight bearing as tolerated   Pertinent Vitals/Pain 6/10 R hip      Mobility  Bed Mobility Details for Bed Mobility Assistance: pt sitting eob at start of session Transfers Transfers: Sit to Stand;Stand to Sit Sit to Stand: 4: Min guard;From bed;With upper extremity assist Stand to Sit: 4: Min guard;To chair/3-in-1;With upper extremity assist Details for Transfer Assistance: VCs safety, technique, hand placement.  Ambulation/Gait Ambulation/Gait Assistance: 4: Min guard Ambulation Distance (Feet): 70 Feet Assistive device: Rolling walker Ambulation/Gait Assistance Details: VCs safety, sequence. Good gait speed. Pt reports increased pain with ambulation-6/10 Gait Pattern: Step-to pattern;Antalgic;Decreased stride length;Decreased step length - right    Shoulder Instructions     Exercises     PT Diagnosis: Difficulty walking;Abnormality of gait;Acute pain  PT Problem List: Decreased  strength;Decreased range of motion;Decreased activity tolerance;Decreased mobility;Pain;Decreased knowledge of use of DME PT Treatment Interventions: DME instruction;Gait training;Stair training;Functional mobility training;Therapeutic activities;Therapeutic exercise;Patient/family education   PT Goals Acute Rehab PT Goals PT Goal Formulation: With patient Time For Goal Achievement: 03/15/12 Potential to Achieve Goals: Good Pt will go Supine/Side to Sit: with supervision PT Goal: Supine/Side to Sit - Progress: Goal set today Pt will go Sit to Supine/Side: with supervision PT Goal: Sit to Supine/Side - Progress: Goal set today Pt will go Sit to Stand: with supervision PT Goal: Sit to Stand - Progress: Goal set today Pt will Ambulate: 51 - 150 feet;with supervision;with rolling walker PT Goal: Ambulate - Progress: Goal set today Pt will Go Up / Down Stairs: 3-5 stairs;with supervision;with rail(s);with least restrictive assistive device PT Goal: Up/Down Stairs - Progress: Goal set today  Visit Information  Last PT Received On: 03/08/12 Assistance Needed: +1    Subjective Data  Subjective: "I feel warm...Marland Kitchena little funny" Patient Stated Goal: Home   Prior Functioning  Home Living Lives With: Family Type of Home: House Home Access: Stairs to enter Secretary/administrator of Steps: 4 Entrance Stairs-Rails: Right Home Layout: One level Home Adaptive Equipment: None Prior Function Level of Independence: Independent Able to Take Stairs?: Yes Communication Communication: No difficulties    Cognition  Overall Cognitive Status: Appears within functional limits for tasks assessed/performed Arousal/Alertness: Awake/alert Orientation Level: Appears intact for tasks assessed Behavior During Session: [facility] for tasks performed    Extremity/Trunk Assessment Right Lower Extremity Assessment RLE ROM/Strength/Tone: Deficits RLE ROM/Strength/Tone Deficits: moves ankle well.  Left Lower  Extremity Assessment LLE ROM/Strength/Tone: [facility] for tasks assessed   Balance    End of Session PT - End  of Session Activity Tolerance: Patient tolerated treatment well Patient left: in chair;with call bell/phone within reach;with family/visitor present  GP     [name], [name] [facility] 03/08/2012, 10:04 AM [phone]

## 2012-03-08 NOTE — Discharge Summary (Signed)
Physician Discharge Summary  Patient ID: Denise [name] MRN: [id number] DOB/AGE: [birth year] 61 y.o.  Admit date: 03/07/2012 Discharge date:  03/08/2012  Procedures:  Procedure(s) (LRB): TOTAL HIP ARTHROPLASTY ANTERIOR APPROACH (Right)  Attending Physician:  Dr. Durene Romans   Admission Diagnoses:   Right hip OA / pain  Discharge Diagnoses:  Principal Problem:  *S/P right THA, AA Active Problems:  Expected blood loss anemia  Morbid obesity with BMI of 45.0-49.9, adult HTN (hypertension)   Anemia   HTN (hypertension)   Arthritis   Diabetes mellitus    HPI:   Denise [name], 61 y.o. female, has a history of pain and functional disability in the right hip(s) due to arthritis and patient has failed non-surgical conservative treatments for greater than 12 weeks to include NSAID's and/or analgesics, corticosteriod injections, supervised PT with diminished ADL's post treatment, use of assistive devices and activity modification. Onset of symptoms was gradual starting 5 years ago with gradually worsening course since that time.The patient noted no past surgery on the right hip(s). Patient currently rates pain in the right hip at 10 out of 10 with activity. Patient has worsening of pain with activity and weight bearing, trendelenberg gait, pain that interfers with activities of daily living and pain with passive range of motion. Patient has evidence of periarticular osteophytes and joint space narrowing by imaging studies. This condition presents safety issues increasing the risk of falls. There is no current active infection. Risks, benefits and expectations were discussed with the patient. Patient understand the risks, benefits and expectations and wishes to proceed with surgery.   PCP: Denise Courier, MD   Discharged Condition: good  Hospital Course:  Patient underwent the above stated procedure on 03/07/2012. Patient tolerated the procedure well and brought to the recovery room in good  condition and subsequently to the floor.  POD #1 BP: 137/80 ; Pulse: 83 ; Temp: 99.3 F (37.4 C) ; Resp: 14  Pt's foley was removed, as well as the hemovac drain removed. IV was changed to a saline lock. Patient reports pain as mild, pain well controlled. Feels like the leg is a little heavy, but otherwise feels like she is doing well. No events throughout the night. Ready to be discharged home if she does well with PT. Neurovascular intact, dorsiflexion/plantar flexion intact, incision: dressing C/D/I, no cellulitis present and compartment soft.   LABS  Basename  03/08/12 0407   HGB  9.3  HCT  29.3    Discharge Exam: General appearance: alert, cooperative and no distress Extremities: Homans sign is negative, no sign of DVT, no edema, redness or tenderness in the calves or thighs and no ulcers, gangrene or trophic changes  Disposition:   Home or Self Care with follow up in 2 weeks   Follow-up Information    Follow up with Shelda Pal, MD. Schedule an appointment as soon as possible for a visit in 2 weeks.   Contact information:   [address] [place] [address] [phone]          Discharge Orders    Future Orders Please Complete By Expires   Diet - low sodium heart healthy      Call MD / Call 911      Comments:   If you experience chest pain or shortness of breath, CALL 911 and be transported to the hospital emergency room.  If you develope a fever above 101 F, pus (white drainage) or increased drainage or redness at the wound, or  calf pain, call your surgeon's office.   Discharge instructions      Comments:   Maintain surgical dressing for 10-14 days, then replace with gauze and tape. Keep the area dry and clean until follow up. Follow up in 2 weeks at [facility]. Call with any questions or concerns.   Constipation Prevention      Comments:   Drink plenty of fluids.  Prune juice may be helpful.  You may use a stool softener, such as  Colace (over the counter) 100 mg twice a day.  Use MiraLax (over the counter) for constipation as needed.   Increase activity slowly as tolerated      Change dressing      Comments:   Maintain surgical dressing for 10-14 days, then replace with 4x4 guaze and tape. Keep the area dry and clean.   TED hose      Comments:   Use stockings (TED hose) for 2 weeks on both leg(s).  You may remove them at night for sleeping.   Weight bearing as tolerated         Current Discharge Medication List    START taking these medications   Details  diphenhydrAMINE (BENADRYL) 25 mg capsule Take 1 capsule (25 mg total) by mouth every 6 (six) hours as needed for itching, allergies or sleep. Qty: 30 capsule    docusate sodium 100 MG CAPS Take 100 mg by mouth 2 (two) times daily. Qty: 10 capsule    HYDROcodone-acetaminophen (NORCO) 7.5-325 MG per tablet Take 1-2 tablets by mouth every 4 (four) hours as needed for pain. Qty: 120 tablet, Refills: 0    methocarbamol (ROBAXIN) 500 MG tablet Take 1 tablet (500 mg total) by mouth every 6 (six) hours as needed (muscle spasms).    polyethylene glycol (MIRALAX / GLYCOLAX) packet Take 17 g by mouth 2 (two) times daily. Qty: 14 each      CONTINUE these medications which have CHANGED   Details  aspirin EC 325 MG tablet Take 1 tablet (325 mg total) by mouth 2 (two) times daily. X 4 weeks Qty: 60 tablet, Refills: 0    ferrous sulfate 325 (65 FE) MG tablet Take 1 tablet (325 mg total) by mouth 3 (three) times daily after meals.      CONTINUE these medications which have NOT CHANGED   Details  amLODipine (NORVASC) 5 MG tablet Take 5 mg by mouth every morning.     Biotin 5000 MCG TABS Take 5,000 mcg by mouth every morning.    Calcium Carbonate Antacid (TUMS PO) Take 4 tablets by mouth 2 (two) times daily.     Calcium Carbonate-Vitamin D (CALCIUM 600 + D PO) Take 1 tablet by mouth at bedtime.    Cyanocobalamin (VITAMIN B-12) 2500 MCG SUBL Place 2,500 mcg  under the tongue every morning.     !! gabapentin (NEURONTIN) 100 MG capsule Take 100 mg by mouth 2 (two) times daily.     !! gabapentin (NEURONTIN) 300 MG capsule Take 600 mg by mouth at bedtime.     Multiple Vitamin (MULITIVITAMIN WITH MINERALS) TABS Take 1 tablet by mouth every morning.     Polyethyl Glycol-Propyl Glycol (SYSTANE) 0.4-0.3 % SOLN Place 1 drop into both eyes 3 (three) times daily.     !! - Potential duplicate medications found. Please discuss with provider.    STOP taking these medications     diclofenac sodium (VOLTAREN) 1 % GEL Comments:  Reason for Stopping:  naproxen (NAPROSYN) 500 MG tablet Comments:  Reason for Stopping:       traMADol (ULTRAM) 50 MG tablet Comments:  Reason for Stopping:           Signed: Anastasio Auerbach. Saryiah Bencosme   PAC  03/08/2012, 7:56 AM

## 2012-03-08 NOTE — Progress Notes (Signed)
Utilization review completed.  

## 2012-03-08 NOTE — Progress Notes (Signed)
   Subjective: 1 Day Post-Op Procedure(s) (LRB): TOTAL HIP ARTHROPLASTY ANTERIOR APPROACH (Right)   Patient reports pain as mild, pain well controlled. Feels like the leg is a little heavy, but otherwise feels like she is doing well. No events throughout the night. Ready to be discharged home if she does well with PT.  Objective:   VITALS:   Filed Vitals:   03/08/12 0649  BP: 137/80  Pulse: 83  Temp: 99.3 F (37.4 C)  Resp: 14    Neurovascular intact Dorsiflexion/Plantar flexion intact Incision: dressing C/D/I No cellulitis present Compartment soft  LABS  Basename 03/08/12 0407 03/05/12 1210  HGB 9.3* 11.7*  HCT 29.3* 36.9  WBC 9.6 6.0  PLT 335 394     Basename 03/08/12 0407 03/05/12 1210  NA 137 142  K 3.9 4.2  BUN 8 9  CREATININE 0.76 0.77  GLUCOSE 117* 96     Assessment/Plan: 1 Day Post-Op Procedure(s) (LRB): TOTAL HIP ARTHROPLASTY ANTERIOR APPROACH (Right) HV drain d/c'ed Foley cath d/c'ed Advance diet Up with therapy D/C IV fluids Discharge home with home health Follow up in 2 weeks at [facility]. Follow up with OLIN,Loreda Silverio D in 2 weeks.  Contact information:  [facility] [address], [address] [place] [address] [phone]    Expected ABLA  Treated with iron and will observe  Morbid Obesity (BMI >40)  Estimated Body mass index is 46.67 kg/(m^2) as calculated from the following:   Height as of this encounter: 5\' 1" (1.549 m).   Weight as of this encounter: 247 lb(112.038 kg). Patient also counseled that weight may inhibit the healing process Patient counseled that losing weight will help with future health issues        Anastasio Auerbach. Kalianne Fetting   PAC  03/08/2012, 7:50 AM

## 2012-03-08 NOTE — Care Management Note (Signed)
    Page 1 of 2   03/08/2012     1:51:31 PM   CARE MANAGEMENT NOTE 03/08/2012  Patient:  Denise [name], Denise [name]   Account Number:  [id number]  Date Initiated:  03/08/2012  Documentation initiated by:  Colleen Can  Subjective/Objective Assessment:   dx osteoarthritis right hip; total hip replacemnt-anterior approach     Action/Plan:   CM spoke with patient. Plans are for patient to return to her home in [place] where her spouse will be caregiver.   Anticipated DC Date:  03/09/2012   Anticipated DC Plan:  HOME W HOME HEALTH SERVICES  In-house referral  NA      DC Planning Services  CM consult      [facility] Choice  HOME HEALTH  DURABLE MEDICAL EQUIPMENT   Choice offered to / List presented to:  C-1 Patient   DME arranged  3-N-1  Levan Hurst      DME agency  [facility]     HH arranged  HH-2 PT      [facility] agency  [facility]   Status of service:  Completed, signed off Medicare Important Message given?   (If response is "NO", the following Medicare IM given date fields will be blank) Date Medicare IM given:   Date Additional Medicare IM given:    Discharge Disposition:  HOME W HOME HEALTH SERVICES  Per UR Regulation:    If discussed at Long Length of Stay Meetings, dates discussed:    Comments:  03/08/2012 Colleen Can BSN RN CCM [phone] [facility] can provide [facility] services with start date of day after discharge.

## 2012-03-08 NOTE — Progress Notes (Addendum)
Physical Therapy Treatment Patient Details Name: Shaquan Puerta MRN: [id number] DOB: 01-07-1951 Today's Date: 03/08/2012 Time: 1200-1230 PT Time Calculation (min): 25 min  PT Assessment / Plan / Recommendation Comments on Treatment Session  Progressing well. 2nd session to review all education (stairs, ambulation, exercises). All education completed. Ready for d/c home.     Follow Up Recommendations  Home health PT     Does the patient have the potential to tolerate intense rehabilitation     Barriers to Discharge        Equipment Recommendations  Rolling walker with 5" wheels    Recommendations for Other Services OT consult  Frequency 7X/week   Plan Discharge plan remains appropriate    Precautions / Restrictions Precautions Precautions: None Restrictions Weight Bearing Restrictions: No RLE Weight Bearing: Weight bearing as tolerated   Pertinent Vitals/Pain 5/10 R knee    Mobility  Bed Mobility Bed Mobility: Not assessed Details for Bed Mobility Assistance: pt sitting eob at start of session Transfers Transfers: Sit to Stand;Stand to Sit Sit to Stand: 5: Supervision;From chair/3-in-1 Stand to Sit: 5: Supervision;To chair/3-in-1 Details for Transfer Assistance: VCS safety, hand placement. Ambulation/Gait Ambulation/Gait Assistance: 5: Supervision Ambulation Distance (Feet): 150 Feet Assistive device: Rolling walker Ambulation/Gait Assistance Details: VCsd safety,. Good gait speed. Pain 5/10 with ambulation Gait Pattern: Step-through pattern;Antalgic;Decreased stance time - right;Decreased stride length;Decreased step length - right Stairs: Yes Stairs Assistance: 4: Min guard Stairs Assistance Details (indicate cue type and reason): VCs safety, technique, hand placement, sequence.  Stair Management Technique: One rail Left;With crutches Number of Stairs: 4     Exercises Total Joint Exercises Ankle Circles/Pumps: AROM;Both;10 reps;Seated Quad Sets: AROM;Both;10  reps;Seated Short Arc Quad: AROM;Right;10 reps;Seated Heel Slides: AAROM;Right;10 reps;Seated Hip ABduction/ADduction: AAROM;Right;10 reps;Seated   PT Diagnosis: Difficulty walking;Abnormality of gait;Acute pain  PT Problem List: Decreased strength;Decreased range of motion;Decreased activity tolerance;Decreased mobility;Pain;Decreased knowledge of use of DME PT Treatment Interventions: DME instruction;Gait training;Stair training;Functional mobility training;Therapeutic activities;Therapeutic exercise;Patient/family education   PT Goals Acute Rehab PT Goals PT Goal Formulation: With patient Time For Goal Achievement: 03/15/12 Potential to Achieve Goals: Good Pt will go Supine/Side to Sit: with supervision PT Goal: Supine/Side to Sit - Progress: Goal set today Pt will go Sit to Supine/Side: with supervision PT Goal: Sit to Supine/Side - Progress: Goal set today Pt will go Sit to Stand: with supervision PT Goal: Sit to Stand - Progress: Progressing toward goal Pt will Ambulate: 51 - 150 feet;with supervision;with rolling walker PT Goal: Ambulate - Progress: Progressing toward goal Pt will Go Up / Down Stairs: 3-5 stairs;with supervision;with rail(s);with least restrictive assistive device PT Goal: Up/Down Stairs - Progress: Progressing toward goal  Visit Information  Last PT Received On: 03/08/12 Assistance Needed: +1    Subjective Data  Subjective: "It just sore where that incision is" Patient Stated Goal: home   Cognition  Overall Cognitive Status: Appears within functional limits for tasks assessed/performed Arousal/Alertness: Awake/alert Orientation Level: Appears intact for tasks assessed Behavior During Session: [facility] for tasks performed    Balance     End of Session PT - End of Session Equipment Utilized During Treatment: Gait belt Activity Tolerance: Patient tolerated treatment well Patient left: in chair;with call bell/phone within reach   GP     Deann, Mclaine  [facility] 03/08/2012, 12:31 PM [phone]

## 2012-03-08 NOTE — Evaluation (Signed)
Occupational Therapy Evaluation Patient Details Name: Denise [name] MRN: [id number] DOB: 02-Oct-1950 Today's Date: 03/08/2012 Time: 1215-1227 and 10:05-10:17 OT Time Calculation (min): 14 min  OT Assessment / Plan / Recommendation Clinical Impression  This 61 year old female was admitted for R anterior approach THA.  All education was completed.  Pt does not need follow up OT    OT Assessment  Patient does not need any further OT services    Follow Up Recommendations  No OT follow up    Barriers to Discharge      Equipment Recommendations  3 in 1 bedside comode (delivered, but no bucket provided:  RN to call to  correct)    Recommendations for Other Services    Frequency       Precautions / Restrictions Precautions Precautions: None Restrictions Weight Bearing Restrictions: No RLE Weight Bearing: Weight bearing as tolerated   Pertinent Vitals/Pain R hip sore only.  Repositioned    ADL  Lower Body Bathing: Simulated;Minimal assistance Where Assessed - Lower Body Bathing: Supported sit to stand Lower Body Dressing: Simulated;Moderate assistance Where Assessed - Lower Body Dressing: Supported sit to Pharmacist, hospital: Research scientist (life sciences) Method: Sit to Barista: Materials engineer and Hygiene: Simulated;Supervision/safety Where Assessed - Engineer, mining and Hygiene: Sit to stand from 3-in-1 or toilet Tub/Shower Transfer: Performed;Supervision/safety Tub/Shower Transfer Method: Ambulating Transfers/Ambulation Related to ADLs: ambulated to bathroom with supervision ADL Comments: Educated on reacher:  pt feels she will get one.  Will have help at home as needed.  Pt's 3:1 was delivered with a splash guard but no bucket:  RN will call to have this delivered.      OT Diagnosis:    OT Problem List:   OT Treatment Interventions:     OT Goals    Visit Information  Last OT  Received On: 03/08/12 Assistance Needed: +1    Subjective Data  Subjective: I think I will get a reacher Patient Stated Goal: get back to PLOF   Prior Functioning     Home Living Lives With: Family Type of Home: House Home Access: Stairs to enter Secretary/administrator of Steps: 4 Entrance Stairs-Rails: Right Home Layout: One level Bathroom Shower/Tub: Health visitor: Standard Home Adaptive Equipment: None Additional Comments: 3:1 delivered to room Prior Function Level of Independence: Independent Able to Take Stairs?: Yes Communication Communication: No difficulties         Vision/Perception     Cognition  Overall Cognitive Status: Appears within functional limits for tasks assessed/performed Arousal/Alertness: Awake/alert Orientation Level: Appears intact for tasks assessed Behavior During Session: [facility] for tasks performed    Extremity/Trunk Assessment Right Upper Extremity Assessment RUE ROM/Strength/Tone: Within functional levels Left Upper Extremity Assessment LUE ROM/Strength/Tone: Within functional levels Right Lower Extremity Assessment RLE ROM/Strength/Tone: Deficits RLE ROM/Strength/Tone Deficits: moves ankle well.  Left Lower Extremity Assessment LLE ROM/Strength/Tone: [facility] for tasks assessed     Mobility Bed Mobility Bed Mobility: Not assessed Details for Bed Mobility Assistance: pt sitting eob at start of session Transfers Sit to Stand: 5: Supervision;From chair/3-in-1 Stand to Sit: 5: Supervision;To chair/3-in-1 Details for Transfer Assistance: VCS safety, hand placement.     Shoulder Instructions     Exercise    Balance     End of Session OT - End of Session Activity Tolerance: Patient tolerated treatment well Patient left: in chair;with call bell/phone within reach;with family/visitor present  GO     Patriciaann Rabanal 03/08/2012, 12:37 PM Roselee Nova  Karleen Hampshire, OTR/L [phone] 03/08/2012

## 2015-01-14 ENCOUNTER — Encounter (HOSPITAL_COMMUNITY): Payer: Self-pay

## 2015-01-14 ENCOUNTER — Other Ambulatory Visit (HOSPITAL_COMMUNITY): Payer: Self-pay | Admitting: *Deleted

## 2015-01-14 ENCOUNTER — Encounter (HOSPITAL_COMMUNITY)
Admission: RE | Admit: 2015-01-14 | Discharge: 2015-01-14 | Disposition: A | Discharge status: Home / Self Care | Payer: PRIVATE HEALTH INSURANCE | Source: Ambulatory Visit | Attending: Orthopedic Surgery | Admitting: Orthopedic Surgery

## 2015-01-14 DIAGNOSIS — M1990 Unspecified osteoarthritis, unspecified site: Secondary | ICD-10-CM | POA: Insufficient documentation

## 2015-01-14 DIAGNOSIS — Z01812 Encounter for preprocedural laboratory examination: Secondary | ICD-10-CM | POA: Insufficient documentation

## 2015-01-14 DIAGNOSIS — Z01818 Encounter for other preprocedural examination: Secondary | ICD-10-CM | POA: Diagnosis not present

## 2015-01-14 DIAGNOSIS — I10 Essential (primary) hypertension: Secondary | ICD-10-CM | POA: Insufficient documentation

## 2015-01-14 DIAGNOSIS — I452 Bifascicular block: Secondary | ICD-10-CM | POA: Diagnosis not present

## 2015-01-14 HISTORY — DX: Reserved for inherently not codable concepts without codable children: IMO0001

## 2015-01-14 HISTORY — DX: Gastro-esophageal reflux disease without esophagitis: K21.9

## 2015-01-14 LAB — PROTIME-INR
INR: 1.08 (ref 0.00–1.49)
Prothrombin Time: 14.2 seconds (ref 11.6–15.2)

## 2015-01-14 LAB — CBC WITH DIFFERENTIAL/PLATELET
Basophils Absolute: 0 10*3/uL (ref 0.0–0.1)
Basophils Relative: 1 %
Eosinophils Absolute: 0.1 10*3/uL (ref 0.0–0.7)
Eosinophils Relative: 2 %
HCT: 39.4 % (ref 36.0–46.0)
Hemoglobin: 12.1 g/dL (ref 12.0–15.0)
Lymphocytes Relative: 32 %
Lymphs Abs: 1.7 10*3/uL (ref 0.7–4.0)
MCH: 26.4 pg (ref 26.0–34.0)
MCHC: 30.7 g/dL (ref 30.0–36.0)
MCV: 86 fL (ref 78.0–100.0)
Monocytes Absolute: 0.3 10*3/uL (ref 0.1–1.0)
Monocytes Relative: 6 %
Neutro Abs: 3.1 10*3/uL (ref 1.7–7.7)
Neutrophils Relative %: 59 %
Platelets: 351 10*3/uL (ref 150–400)
RBC: 4.58 MIL/uL (ref 3.87–5.11)
RDW: 14.5 % (ref 11.5–15.5)
WBC: 5.2 10*3/uL (ref 4.0–10.5)

## 2015-01-14 LAB — SURGICAL PCR SCREEN
MRSA, PCR: NEGATIVE
Staphylococcus aureus: NEGATIVE

## 2015-01-14 LAB — COMPREHENSIVE METABOLIC PANEL
ALT: 17 U/L (ref 14–54)
AST: 16 U/L (ref 15–41)
Albumin: 3.6 g/dL (ref 3.5–5.0)
Alkaline Phosphatase: 95 U/L (ref 38–126)
Anion gap: 8 (ref 5–15)
BUN: 9 mg/dL (ref 6–20)
CO2: 28 mmol/L (ref 22–32)
Calcium: 9.2 mg/dL (ref 8.9–10.3)
Chloride: 107 mmol/L (ref 101–111)
Creatinine, Ser: 0.72 mg/dL (ref 0.44–1.00)
GFR calc Af Amer: >60 mL/min (ref >60)
GFR calc non Af Amer: >60 mL/min (ref >60)
Glucose, Bld: 101 mg/dL — ABNORMAL HIGH (ref 65–99)
Potassium: 3.6 mmol/L (ref 3.5–5.1)
Sodium: 143 mmol/L (ref 135–145)
Total Bilirubin: 0.3 mg/dL (ref 0.3–1.2)
Total Protein: 6.2 g/dL — ABNORMAL LOW (ref 6.5–8.1)

## 2015-01-14 LAB — GLUCOSE, CAPILLARY: Glucose-Capillary: 90 mg/dL (ref 65–99)

## 2015-01-14 LAB — APTT: aPTT: 31 seconds (ref 24–37)

## 2015-01-14 NOTE — Progress Notes (Signed)
Pt denies cardiac history, chest pain or sob. Pt's PCP is Dr. Iona Beard. She states her fasting blood sugars are around 90 or below. Her diabetes is diet controlled. She thinks her last A1C was in February or March of this year and it was "6.something". Last one noted in EPIC was from 2013 and it was 6.3.

## 2015-01-14 NOTE — Pre-Procedure Instructions (Signed)
Denise [name]  01/14/2015    Your procedure is scheduled on Thursday, January 21, 2015 at 7:30 AM.   Report to [facility] Entrance "A" Admitting Office at 5:30 AM.   Call this number if you have problems the morning of surgery: [phone]   Any questions prior to day of surgery, please call [phone] between 8 & 4 PM.   Remember:  Do not eat food or drink liquids after midnight Wednesday, 01/20/15.  Take these medicines the morning of surgery with A SIP OF WATER: Amlodipine (Norvasc), Ranitidine (Zantac)  Stop Aspirin, NSAIDS (Ibuprofen, Aleve, etc.) and Multivitamins as of today.   Do not wear jewelry, make-up or nail polish.  Do not wear lotions, powders, or perfumes.  You may NOT wear deodorant.  Do not shave 48 hours prior to surgery.    Do not bring valuables to the hospital.  [facility] is not responsible for any belongings or valuables.  Contacts, dentures or bridgework may not be worn into surgery.  Leave your suitcase in the car.  After surgery it may be brought to your room.  For patients admitted to the hospital, discharge time will be determined by your treatment team.  Special instructions: [place] - Preparing for Surgery  Before surgery, you can play an important role.  Because skin is not sterile, your skin needs to be as free of germs as possible.  You can reduce the number of germs on you skin by washing with CHG (chlorahexidine gluconate) soap before surgery.  CHG is an antiseptic cleaner which kills germs and bonds with the skin to continue killing germs even after washing.  Please DO NOT use if you have an allergy to CHG or antibacterial soaps.  If your skin becomes reddened/irritated stop using the CHG and inform your nurse when you arrive at Short Stay.  Do not shave (including legs and underarms) for at least 48 hours prior to the first CHG shower.  You may shave your face.  Please follow these instructions carefully:   1.  Shower with  CHG Soap the night before surgery and the                                morning of Surgery.  2.  If you choose to wash your hair, wash your hair first as usual with your       normal shampoo.  3.  After you shampoo, rinse your hair and body thoroughly to remove the                      Shampoo.  4.  Use CHG as you would any other liquid soap.  You can apply chg directly       to the skin and wash gently with scrungie or a clean washcloth.  5.  Apply the CHG Soap to your body ONLY FROM THE NECK DOWN.        Do not use on open wounds or open sores.  Avoid contact with your eyes, ears, mouth and genitals (private parts).  Wash genitals (private parts) with your normal soap.  6.  Wash thoroughly, paying special attention to the area where your surgery        will be performed.  7.  Thoroughly rinse your body with warm water from the neck down.  8.  DO NOT shower/wash with your normal soap after using  and rinsing off       the CHG Soap.  9.  Pat yourself dry with a clean towel.            10.  Wear clean pajamas.            11.  Place clean sheets on your bed the night of your first shower and do not        sleep with pets.  Day of Surgery  Do not apply any lotions/deodorants the morning of surgery.  Please wear clean clothes to the hospital.  Please read over the following fact sheets that you were given. Pain Booklet, Coughing and Deep Breathing, MRSA Information and Surgical Site Infection Prevention

## 2015-01-15 LAB — HEMOGLOBIN A1C
Hgb A1c MFr Bld: 6.3 % — ABNORMAL HIGH (ref 4.8–5.6)
Mean Plasma Glucose: 134 mg/dL

## 2015-01-20 MED ORDER — CEFAZOLIN SODIUM 10 G IJ SOLR
3.0000 g | INTRAMUSCULAR | Status: AC
  Administered 2015-01-21: 3 g via INTRAVENOUS
  Filled 2015-01-20: qty 3000

## 2015-01-20 MED ORDER — LACTATED RINGERS IV SOLN
INTRAVENOUS | Status: DC
  Administered 2015-01-21 (×2): via INTRAVENOUS

## 2015-01-20 MED ORDER — CHLORHEXIDINE GLUCONATE 4 % EX LIQD: 60.0000 mL | Freq: Once | CUTANEOUS | Status: DC

## 2015-01-20 NOTE — Anesthesia Preprocedure Evaluation (Addendum)
Anesthesia Evaluation  Patient identified by MRN, date of birth, ID band Patient awake    Reviewed: Allergy & Precautions, NPO status , Patient's Chart, lab work & pertinent test results  Airway Mallampati: II  TM Distance: >3 FB     Dental  (+) Edentulous Upper, Edentulous Lower   Pulmonary former smoker,    breath sounds clear to auscultation       Cardiovascular hypertension, Pt. on medications  Rhythm:Regular Rate:Normal     Neuro/Psych negative neurological ROS     GI/Hepatic Neg liver ROS, GERD  ,  Endo/Other  diabetes, Type 2Morbid obesity  Renal/GU negative Renal ROS     Musculoskeletal  (+) Arthritis ,   Abdominal   Peds  Hematology  (+) anemia ,   Anesthesia Other Findings   Reproductive/Obstetrics                            Anesthesia Physical Anesthesia Plan  ASA: III  Anesthesia Plan: General and Regional   Post-op Pain Management: GA combined w/ Regional for post-op pain   Induction: Intravenous  Airway Management Planned: Oral ETT  Additional Equipment:   Intra-op Plan:   Post-operative Plan: Extubation in OR  Informed Consent: I have reviewed the patients History and Physical, chart, labs and discussed the procedure including the risks, benefits and alternatives for the proposed anesthesia with the patient or authorized representative who has indicated his/her understanding and acceptance.   Dental advisory given  Plan Discussed with: CRNA  Anesthesia Plan Comments:         Anesthesia Quick Evaluation

## 2015-01-21 ENCOUNTER — Encounter (HOSPITAL_COMMUNITY): Payer: Self-pay | Admitting: *Deleted

## 2015-01-21 ENCOUNTER — Observation Stay (HOSPITAL_COMMUNITY)
Admission: RE | Admit: 2015-01-21 | Discharge: 2015-01-22 | Disposition: A | Discharge status: Home / Self Care | Payer: PRIVATE HEALTH INSURANCE | Source: Ambulatory Visit | Attending: Orthopedic Surgery | Admitting: Orthopedic Surgery

## 2015-01-21 ENCOUNTER — Ambulatory Visit (HOSPITAL_COMMUNITY): Payer: PRIVATE HEALTH INSURANCE | Admitting: Anesthesiology

## 2015-01-21 ENCOUNTER — Encounter (HOSPITAL_COMMUNITY)
Admission: RE | Disposition: A | Discharge status: Home / Self Care | Payer: Self-pay | Source: Ambulatory Visit | Attending: Orthopedic Surgery

## 2015-01-21 DIAGNOSIS — K219 Gastro-esophageal reflux disease without esophagitis: Secondary | ICD-10-CM | POA: Diagnosis not present

## 2015-01-21 DIAGNOSIS — Z96619 Presence of unspecified artificial shoulder joint: Secondary | ICD-10-CM

## 2015-01-21 DIAGNOSIS — E119 Type 2 diabetes mellitus without complications: Secondary | ICD-10-CM | POA: Diagnosis not present

## 2015-01-21 DIAGNOSIS — R0600 Dyspnea, unspecified: Secondary | ICD-10-CM | POA: Insufficient documentation

## 2015-01-21 DIAGNOSIS — Z87891 Personal history of nicotine dependence: Secondary | ICD-10-CM | POA: Diagnosis not present

## 2015-01-21 DIAGNOSIS — Z6841 Body Mass Index (BMI) 40.0 and over, adult: Secondary | ICD-10-CM | POA: Diagnosis not present

## 2015-01-21 DIAGNOSIS — Z7982 Long term (current) use of aspirin: Secondary | ICD-10-CM | POA: Diagnosis not present

## 2015-01-21 DIAGNOSIS — M19012 Primary osteoarthritis, left shoulder: Principal | ICD-10-CM | POA: Insufficient documentation

## 2015-01-21 DIAGNOSIS — I10 Essential (primary) hypertension: Secondary | ICD-10-CM | POA: Diagnosis not present

## 2015-01-21 DIAGNOSIS — D649 Anemia, unspecified: Secondary | ICD-10-CM | POA: Insufficient documentation

## 2015-01-21 DIAGNOSIS — Z96612 Presence of left artificial shoulder joint: Secondary | ICD-10-CM

## 2015-01-21 DIAGNOSIS — Z9884 Bariatric surgery status: Secondary | ICD-10-CM | POA: Diagnosis not present

## 2015-01-21 HISTORY — PX: TOTAL SHOULDER ARTHROPLASTY: SHX126

## 2015-01-21 LAB — GLUCOSE, CAPILLARY
Glucose-Capillary: 95 mg/dL (ref 65–99)
Glucose-Capillary: 101 mg/dL — ABNORMAL HIGH (ref 65–99)

## 2015-01-21 SURGERY — ARTHROPLASTY, SHOULDER, TOTAL
Anesthesia: Regional | Site: Shoulder | Laterality: Left

## 2015-01-21 MED ORDER — METOCLOPRAMIDE HCL 5 MG/ML IJ SOLN: 5.0000 mg | Freq: Three times a day (TID) | INTRAMUSCULAR | Status: DC | PRN

## 2015-01-21 MED ORDER — ASPIRIN EC 81 MG PO TBEC
81.0000 mg | DELAYED_RELEASE_TABLET | Freq: Every day | ORAL | Status: DC
  Administered 2015-01-21 – 2015-01-22 (×2): 81 mg via ORAL
  Filled 2015-01-21 (×2): qty 1

## 2015-01-21 MED ORDER — PROPOFOL 10 MG/ML IV BOLUS
INTRAVENOUS | Status: DC | PRN
  Administered 2015-01-21: 200 mg via INTRAVENOUS

## 2015-01-21 MED ORDER — ROCURONIUM BROMIDE 100 MG/10ML IV SOLN
INTRAVENOUS | Status: DC | PRN
  Administered 2015-01-21: 50 mg via INTRAVENOUS

## 2015-01-21 MED ORDER — FLEET ENEMA 7-19 GM/118ML RE ENEM: 1.0000 | ENEMA | Freq: Once | RECTAL | Status: DC | PRN

## 2015-01-21 MED ORDER — ONDANSETRON HCL 4 MG PO TABS: 4.0000 mg | ORAL_TABLET | Freq: Four times a day (QID) | ORAL | Status: DC | PRN

## 2015-01-21 MED ORDER — HYDROMORPHONE HCL 1 MG/ML IJ SOLN
0.2500 mg | INTRAMUSCULAR | Status: DC | PRN
Start: 2015-01-21 — End: 2015-01-21
  Administered 2015-01-21 (×4): 0.5 mg via INTRAVENOUS

## 2015-01-21 MED ORDER — ACETAMINOPHEN 650 MG RE SUPP: 650.0000 mg | Freq: Four times a day (QID) | RECTAL | Status: DC | PRN

## 2015-01-21 MED ORDER — METOCLOPRAMIDE HCL 5 MG PO TABS: 5.0000 mg | ORAL_TABLET | Freq: Three times a day (TID) | ORAL | Status: DC | PRN

## 2015-01-21 MED ORDER — FENTANYL CITRATE (PF) 100 MCG/2ML IJ SOLN
INTRAMUSCULAR | Status: DC | PRN
  Administered 2015-01-21: 150 ug via INTRAVENOUS
  Administered 2015-01-21: 100 ug via INTRAVENOUS

## 2015-01-21 MED ORDER — FENTANYL CITRATE (PF) 250 MCG/5ML IJ SOLN
INTRAMUSCULAR | Status: AC
  Filled 2015-01-21: qty 5

## 2015-01-21 MED ORDER — OXYCODONE HCL 5 MG PO TABS: 5.0000 mg | ORAL_TABLET | Freq: Once | ORAL | Status: AC | PRN

## 2015-01-21 MED ORDER — FAMOTIDINE 20 MG PO TABS
20.0000 mg | ORAL_TABLET | Freq: Two times a day (BID) | ORAL | Status: DC
  Administered 2015-01-21 – 2015-01-22 (×2): 20 mg via ORAL
  Filled 2015-01-21 (×2): qty 1

## 2015-01-21 MED ORDER — PROMETHAZINE HCL 25 MG/ML IJ SOLN: 6.2500 mg | INTRAMUSCULAR | Status: DC | PRN

## 2015-01-21 MED ORDER — PHENYLEPHRINE HCL 10 MG/ML IJ SOLN
INTRAMUSCULAR | Status: DC | PRN
  Administered 2015-01-21: 80 ug via INTRAVENOUS
  Administered 2015-01-21 (×3): 60 ug via INTRAVENOUS
  Administered 2015-01-21: 80 ug via INTRAVENOUS
  Administered 2015-01-21: 60 ug via INTRAVENOUS

## 2015-01-21 MED ORDER — HYDROMORPHONE HCL 1 MG/ML IJ SOLN
INTRAMUSCULAR | Status: AC
  Filled 2015-01-21: qty 1

## 2015-01-21 MED ORDER — DIPHENHYDRAMINE HCL 12.5 MG/5ML PO ELIX: 12.5000 mg | ORAL_SOLUTION | ORAL | Status: DC | PRN

## 2015-01-21 MED ORDER — TRANEXAMIC ACID 1000 MG/10ML IV SOLN
1000.0000 mg | INTRAVENOUS | Status: AC
  Administered 2015-01-21: 1000 mg via INTRAVENOUS
  Filled 2015-01-21: qty 10

## 2015-01-21 MED ORDER — PROPOFOL 10 MG/ML IV BOLUS
INTRAVENOUS | Status: AC
  Filled 2015-01-21: qty 20

## 2015-01-21 MED ORDER — PHENOL 1.4 % MT LIQD: 1.0000 | OROMUCOSAL | Status: DC | PRN

## 2015-01-21 MED ORDER — AMLODIPINE BESYLATE 5 MG PO TABS
5.0000 mg | ORAL_TABLET | Freq: Every morning | ORAL | Status: DC
  Administered 2015-01-22: 5 mg via ORAL
  Filled 2015-01-21: qty 1

## 2015-01-21 MED ORDER — GLYCOPYRROLATE 0.2 MG/ML IJ SOLN
INTRAMUSCULAR | Status: DC | PRN
  Administered 2015-01-21: .6 mg via INTRAVENOUS

## 2015-01-21 MED ORDER — MIDAZOLAM HCL 5 MG/5ML IJ SOLN
INTRAMUSCULAR | Status: DC | PRN
  Administered 2015-01-21: 2 mg via INTRAVENOUS

## 2015-01-21 MED ORDER — LACTATED RINGERS IV SOLN: INTRAVENOUS | Status: DC

## 2015-01-21 MED ORDER — ACETAMINOPHEN 325 MG PO TABS: 650.0000 mg | ORAL_TABLET | Freq: Four times a day (QID) | ORAL | Status: DC | PRN

## 2015-01-21 MED ORDER — ESMOLOL HCL 100 MG/10ML IV SOLN
INTRAVENOUS | Status: DC | PRN
  Administered 2015-01-21: 20 mg via INTRAVENOUS

## 2015-01-21 MED ORDER — LIDOCAINE HCL (CARDIAC) 20 MG/ML IV SOLN
INTRAVENOUS | Status: DC | PRN
  Administered 2015-01-21: 80 mg via INTRAVENOUS

## 2015-01-21 MED ORDER — ONDANSETRON HCL 4 MG/2ML IJ SOLN
4.0000 mg | Freq: Four times a day (QID) | INTRAMUSCULAR | Status: DC | PRN
  Administered 2015-01-22: 4 mg via INTRAVENOUS
  Filled 2015-01-21 (×2): qty 2

## 2015-01-21 MED ORDER — MENTHOL 3 MG MT LOZG: 1.0000 | LOZENGE | OROMUCOSAL | Status: DC | PRN

## 2015-01-21 MED ORDER — ARTIFICIAL TEARS OP OINT
TOPICAL_OINTMENT | OPHTHALMIC | Status: DC | PRN
  Administered 2015-01-21: 1 via OPHTHALMIC

## 2015-01-21 MED ORDER — BUPIVACAINE-EPINEPHRINE (PF) 0.5% -1:200000 IJ SOLN
INTRAMUSCULAR | Status: DC | PRN
  Administered 2015-01-21: 25 mL via PERINEURAL

## 2015-01-21 MED ORDER — ALUM & MAG HYDROXIDE-SIMETH 200-200-20 MG/5ML PO SUSP: 30.0000 mL | ORAL | Status: DC | PRN

## 2015-01-21 MED ORDER — BISACODYL 5 MG PO TBEC: 5.0000 mg | DELAYED_RELEASE_TABLET | Freq: Every day | ORAL | Status: DC | PRN

## 2015-01-21 MED ORDER — OXYCODONE HCL 5 MG/5ML PO SOLN
5.0000 mg | Freq: Once | ORAL | Status: AC | PRN
  Administered 2015-01-21: 5 mg via ORAL

## 2015-01-21 MED ORDER — 0.9 % SODIUM CHLORIDE (POUR BTL) OPTIME
TOPICAL | Status: DC | PRN
  Administered 2015-01-21: 1000 mL

## 2015-01-21 MED ORDER — NEOSTIGMINE METHYLSULFATE 10 MG/10ML IV SOLN
INTRAVENOUS | Status: DC | PRN
  Administered 2015-01-21: 4 mg via INTRAVENOUS

## 2015-01-21 MED ORDER — SUCCINYLCHOLINE CHLORIDE 20 MG/ML IJ SOLN
INTRAMUSCULAR | Status: DC | PRN
  Administered 2015-01-21: 120 mg via INTRAVENOUS

## 2015-01-21 MED ORDER — VECURONIUM BROMIDE 10 MG IV SOLR
INTRAVENOUS | Status: DC | PRN
  Administered 2015-01-21: 2 mg via INTRAVENOUS

## 2015-01-21 MED ORDER — MIDAZOLAM HCL 2 MG/2ML IJ SOLN
INTRAMUSCULAR | Status: AC
  Filled 2015-01-21: qty 4

## 2015-01-21 MED ORDER — POLYETHYLENE GLYCOL 3350 17 G PO PACK: 17.0000 g | PACK | Freq: Every day | ORAL | Status: DC | PRN

## 2015-01-21 MED ORDER — CEFAZOLIN SODIUM-DEXTROSE 2-3 GM-% IV SOLR
2.0000 g | Freq: Four times a day (QID) | INTRAVENOUS | Status: AC
  Administered 2015-01-21 – 2015-01-22 (×3): 2 g via INTRAVENOUS
  Filled 2015-01-21 (×3): qty 50

## 2015-01-21 MED ORDER — OXYCODONE HCL 5 MG/5ML PO SOLN
ORAL | Status: AC
Start: 2015-01-21 — End: 2015-01-21
  Filled 2015-01-21: qty 5

## 2015-01-21 MED ORDER — OXYCODONE HCL 5 MG PO TABS
5.0000 mg | ORAL_TABLET | ORAL | Status: DC | PRN
  Administered 2015-01-21 – 2015-01-22 (×3): 10 mg via ORAL
  Administered 2015-01-22: 5 mg via ORAL
  Administered 2015-01-22: 10 mg via ORAL
  Filled 2015-01-21 (×3): qty 2
  Filled 2015-01-21: qty 1
  Filled 2015-01-21: qty 2

## 2015-01-21 MED ORDER — DIAZEPAM 5 MG PO TABS
2.5000 mg | ORAL_TABLET | Freq: Four times a day (QID) | ORAL | Status: DC | PRN
  Administered 2015-01-22: 5 mg via ORAL
  Filled 2015-01-21: qty 1

## 2015-01-21 MED ORDER — DOCUSATE SODIUM 100 MG PO CAPS
100.0000 mg | ORAL_CAPSULE | Freq: Two times a day (BID) | ORAL | Status: DC
  Administered 2015-01-21 – 2015-01-22 (×3): 100 mg via ORAL
  Filled 2015-01-21 (×3): qty 1

## 2015-01-21 MED ORDER — HYDROMORPHONE HCL 1 MG/ML IJ SOLN
1.0000 mg | INTRAMUSCULAR | Status: DC | PRN
  Administered 2015-01-22: 1 mg via INTRAVENOUS
  Filled 2015-01-21: qty 1

## 2015-01-21 MED ORDER — ONDANSETRON HCL 4 MG/2ML IJ SOLN
INTRAMUSCULAR | Status: DC | PRN
  Administered 2015-01-21: 4 mg via INTRAVENOUS

## 2015-01-21 MED ORDER — KETOROLAC TROMETHAMINE 15 MG/ML IJ SOLN
7.5000 mg | Freq: Four times a day (QID) | INTRAMUSCULAR | Status: AC
  Administered 2015-01-21 – 2015-01-22 (×4): 7.5 mg via INTRAVENOUS
  Filled 2015-01-21 (×4): qty 1

## 2015-01-21 SURGICAL SUPPLY — 63 items
ADH SKN CLS LQ APL DERMABOND (GAUZE/BANDAGES/DRESSINGS) ×1
AID PSTN UNV HD RSTRNT DISP (MISCELLANEOUS) ×1
BLADE SAW SGTL 83.5X18.5 (BLADE) ×3 IMPLANT
CAPT SHLDR TOTAL 2 ×2 IMPLANT
CEMENT BONE DEPUY (Cement) ×3 IMPLANT
COVER SURGICAL LIGHT HANDLE (MISCELLANEOUS) ×3 IMPLANT
DERMABOND ADHESIVE PROPEN (GAUZE/BANDAGES/DRESSINGS) ×2
DERMABOND ADVANCED .7 DNX6 (GAUZE/BANDAGES/DRESSINGS) ×1 IMPLANT
DRAPE ORTHO SPLIT 77X108 STRL (DRAPES) ×6
DRAPE SURG 17X11 SM STRL (DRAPES) ×3 IMPLANT
DRAPE SURG ORHT 6 SPLT 77X108 (DRAPES) ×2 IMPLANT
DRAPE U-SHAPE 47X51 STRL (DRAPES) ×3 IMPLANT
DRILL BIT 7/64X5 (BIT) IMPLANT
DRSG AQUACEL AG ADV 3.5X10 (GAUZE/BANDAGES/DRESSINGS) ×3 IMPLANT
DURAPREP 26ML APPLICATOR (WOUND CARE) ×3 IMPLANT
ELECT BLADE 4.0 EZ CLEAN MEGAD (MISCELLANEOUS) ×3
ELECT CAUTERY BLADE 6.4 (BLADE) ×3 IMPLANT
ELECT REM PT RETURN 9FT ADLT (ELECTROSURGICAL) ×3
ELECTRODE BLDE 4.0 EZ CLN MEGD (MISCELLANEOUS) ×1 IMPLANT
ELECTRODE REM PT RTRN 9FT ADLT (ELECTROSURGICAL) ×1 IMPLANT
FACESHIELD WRAPAROUND (MASK) ×9 IMPLANT
FACESHIELD WRAPAROUND OR TEAM (MASK) ×3 IMPLANT
GLOVE BIO SURGEON STRL SZ7.5 (GLOVE) ×3 IMPLANT
GLOVE BIO SURGEON STRL SZ8 (GLOVE) ×3 IMPLANT
GLOVE EUDERMIC 7 POWDERFREE (GLOVE) ×3 IMPLANT
GLOVE SS BIOGEL STRL SZ 7.5 (GLOVE) ×1 IMPLANT
GLOVE SUPERSENSE BIOGEL SZ 7.5 (GLOVE) ×2
GOWN STRL REUS W/ TWL LRG LVL3 (GOWN DISPOSABLE) ×1 IMPLANT
GOWN STRL REUS W/ TWL XL LVL3 (GOWN DISPOSABLE) ×2 IMPLANT
GOWN STRL REUS W/TWL LRG LVL3 (GOWN DISPOSABLE) ×3
GOWN STRL REUS W/TWL XL LVL3 (GOWN DISPOSABLE) ×6
KIT BASIN OR (CUSTOM PROCEDURE TRAY) ×3 IMPLANT
KIT ROOM TURNOVER OR (KITS) ×3 IMPLANT
MANIFOLD NEPTUNE II (INSTRUMENTS) ×3 IMPLANT
NDL HYPO 25GX1X1/2 BEV (NEEDLE) IMPLANT
NDL SUT 6 .5 CRC .975X.05 MAYO (NEEDLE) ×1 IMPLANT
NEEDLE HYPO 25GX1X1/2 BEV (NEEDLE) IMPLANT
NEEDLE MAYO TAPER (NEEDLE) ×3
NS IRRIG 1000ML POUR BTL (IV SOLUTION) ×3 IMPLANT
PACK SHOULDER (CUSTOM PROCEDURE TRAY) ×3 IMPLANT
PAD ARMBOARD 7.5X6 YLW CONV (MISCELLANEOUS) ×6 IMPLANT
PASSER SUT SWANSON 36MM LOOP (INSTRUMENTS) IMPLANT
PIN METAGLENE 2.5 (PIN) ×2 IMPLANT
RESTRAINT HEAD UNIVERSAL NS (MISCELLANEOUS) ×3 IMPLANT
SLING ARM FOAM STRAP LRG (SOFTGOODS) ×2 IMPLANT
SLING ARM XL FOAM STRAP (SOFTGOODS) ×3 IMPLANT
SMARTMIX MINI TOWER (MISCELLANEOUS) ×3
SPONGE LAP 18X18 X RAY DECT (DISPOSABLE) IMPLANT
SPONGE LAP 4X18 X RAY DECT (DISPOSABLE) ×3 IMPLANT
SUCTION FRAZIER TIP 10 FR DISP (SUCTIONS) ×3 IMPLANT
SUT BONE WAX W31G (SUTURE) IMPLANT
SUT FIBERWIRE #2 38 T-5 BLUE (SUTURE) ×6
SUT MNCRL AB 3-0 PS2 18 (SUTURE) ×3 IMPLANT
SUT VIC AB 1 CT1 27 (SUTURE) ×6
SUT VIC AB 1 CT1 27XBRD ANBCTR (SUTURE) ×2 IMPLANT
SUT VIC AB 2-0 CT1 27 (SUTURE) ×3
SUT VIC AB 2-0 CT1 TAPERPNT 27 (SUTURE) ×1 IMPLANT
SUTURE FIBERWR #2 38 T-5 BLUE (SUTURE) ×2 IMPLANT
SYR CONTROL 10ML LL (SYRINGE) IMPLANT
TOWEL OR 17X24 6PK STRL BLUE (TOWEL DISPOSABLE) ×3 IMPLANT
TOWEL OR 17X26 10 PK STRL BLUE (TOWEL DISPOSABLE) ×3 IMPLANT
TOWER SMARTMIX MINI (MISCELLANEOUS) ×1 IMPLANT
WATER STERILE IRR 1000ML POUR (IV SOLUTION) ×3 IMPLANT

## 2015-01-21 NOTE — Anesthesia Postprocedure Evaluation (Signed)
  Anesthesia Post-op Note  Patient: Denise [name]  Procedure(s) Performed: Procedure(s): LEFT TOTAL SHOULDER ARTHROPLASTY (Left)  Patient Location: PACU  Anesthesia Type:GA combined with regional for post-op pain  Level of Consciousness: awake and alert   Airway and Oxygen Therapy: Patient Spontanous Breathing  Post-op Pain: mild  Post-op Assessment: Post-op Vital signs reviewed              Post-op Vital Signs: Reviewed  Last Vitals:  Filed Vitals:   01/21/15 1346  BP: 137/82  Pulse: 87  Temp: 36.4 C  Resp: 18    Complications: No apparent anesthesia complications

## 2015-01-21 NOTE — Transfer of Care (Signed)
Immediate Anesthesia Transfer of Care Note  Patient: Denise [name]  Procedure(s) Performed: Procedure(s): LEFT TOTAL SHOULDER ARTHROPLASTY (Left)  Patient Location: PACU  Anesthesia Type:General  Level of Consciousness: awake, alert  and oriented  Airway & Oxygen Therapy: Patient Spontanous Breathing and Patient connected to nasal cannula oxygen  Post-op Assessment: Report given to RN and Post -op Vital signs reviewed and stable  Post vital signs: Reviewed and stable  Last Vitals:  Filed Vitals:   01/21/15 0601  BP: 157/63  Pulse: 80  Temp: 37 C  Resp: 18    Complications: No apparent anesthesia complications

## 2015-01-21 NOTE — Op Note (Signed)
01/21/2015  10:13 AM  PATIENT:   [name]  64 y.o. female  PRE-OPERATIVE DIAGNOSIS:  OA LEFT SHOULDER   POST-OPERATIVE DIAGNOSIS:  same  PROCEDURE:  L TSA #12 stem 44x18 eccentric head, 40 glenoid  SURGEON:  Shreena Baines, Metta Clines M.D.  ASSISTANTS: Shuford pac  ANESTHESIA:   GET + ISB  EBL: 200  SPECIMEN:  none  Drains: none   PATIENT DISPOSITION:  PACU - hemodynamically stable.    PLAN OF CARE: Admit for overnight observation  Dictation# [id number]   Contact # [phone]

## 2015-01-21 NOTE — Discharge Instructions (Signed)

## 2015-01-21 NOTE — Anesthesia Procedure Notes (Addendum)
Anesthesia Regional Block:  Interscalene brachial plexus block  Pre-Anesthetic Checklist: ,, timeout performed, Correct Patient, Correct Site, Correct Laterality, Correct Procedure, Correct Position, site marked, Risks and benefits discussed,  Surgical consent,  Pre-op evaluation,  At surgeon's request and post-op pain management  Laterality: Left  Prep: chloraprep       Needles:  Injection technique: Single-shot  Needle Type: Echogenic Stimulator Needle     Needle Length: 9cm 9 cm Needle Gauge: 21 and 21 G    Additional Needles:  Procedures: ultrasound guided (picture in chart) and nerve stimulator Interscalene brachial plexus block  Nerve Stimulator or Paresthesia:  Response: deltoid, 0.5 mA,   Additional Responses:   Narrative:  Start time: 01/21/2015 6:57 AM End time: 01/21/2015 7:07 AM Injection made incrementally with aspirations every 5 mL.  Performed by: Personally  Anesthesiologist: Suzette Battiest  Additional Notes: Risks and benefits discussed. Pt tolerated well with no immediate complications.   Procedure Name: Intubation Date/Time: 01/21/2015 7:34 AM Performed by: Mariea Clonts Pre-anesthesia Checklist: Emergency Drugs available, Patient identified, Timeout performed, Suction available and Patient being monitored Patient Re-evaluated:Patient Re-evaluated prior to inductionOxygen Delivery Method: Circle system utilized Preoxygenation: Pre-oxygenation with 100% oxygen Intubation Type: IV induction Ventilation: Mask ventilation without difficulty and Oral airway inserted - appropriate to patient size Laryngoscope Size: Sabra Heck and 2 Grade View: Grade I Tube type: Oral Tube size: 7.0 mm Number of attempts: 1 Placement Confirmation: ETT inserted through vocal cords under direct vision,  breath sounds checked- equal and bilateral and positive ETCO2 Tube secured with: Tape Dental Injury: Teeth and Oropharynx as per pre-operative assessment

## 2015-01-21 NOTE — H&P (Signed)
Denise [name]    Chief Complaint: AO LEFT SHOULDER  HPI: The patient is a 64 y.o. female with end stage left shoulder OA  Past Medical History  Diagnosis Date  . HTN (hypertension)   . Anemia   . HTN (hypertension)   . Arthritis   . Diabetes mellitus     diet controlled  . Shortness of breath dyspnea     with exertion  . GERD (gastroesophageal reflux disease)     Past Surgical History  Procedure Laterality Date  . Cholecystectomy  1997  . Gastric bypass    . Laparoscopic salpingoopherectomy    . Incisional hernia repair  04/11/11  . Laparotomy  04/11/2011    Procedure: EXPLORATORY LAPAROTOMY;  Surgeon: Joyice Faster. Cornett, MD;  Location: [facility] ORS;  Service: General;  Laterality: N/A;  closure port hole  . Total hip arthroplasty  03/07/2012    Procedure: TOTAL HIP ARTHROPLASTY ANTERIOR APPROACH;  Surgeon: Mauri Pole, MD;  Location: [facility] ORS;  Service: Orthopedics;  Laterality: Right;  . Vaginal hysterectomy    . Colonoscopy      Family History  Problem Relation Age of Onset  . Arthritis    . Cancer    . Breast cancer Mother   . COPD Mother   . Arthritis Mother   . Diabetes Mother   . Hypertension Mother   . Hypertension Father   . Diabetes Father     Social History:  reports that she quit smoking about 38 years ago. Her smoking use included Cigarettes. She has a 12 pack-year smoking history. She has never used smokeless tobacco. She reports that she does not drink alcohol or use illicit drugs.  Allergies: No Known Allergies  Medications Prior to Admission  Medication Sig Dispense Refill  . amLODipine (NORVASC) 5 MG tablet Take 5 mg by mouth every morning.     Marland Kitchen aspirin EC 81 MG tablet Take 81 mg by mouth daily.    . Calcium Carbonate Antacid (TUMS PO) Take 2 tablets by mouth 2 (two) times daily.     . Cholecalciferol (VITAMIN D PO) Take 1 tablet by mouth daily.    . Cyanocobalamin (VITAMIN B-12) 2500 MCG SUBL Place 2,500 mcg under the tongue every morning.     .  ferrous sulfate 325 (65 FE) MG tablet Take 1 tablet (325 mg total) by mouth 3 (three) times daily after meals. (Patient taking differently: Take 325 mg by mouth daily. )    . ibuprofen (ADVIL,MOTRIN) 200 MG tablet Take 800 mg by mouth 2 (two) times daily.    . Multiple Vitamin (MULITIVITAMIN WITH MINERALS) TABS Take 1 tablet by mouth every morning.     . ranitidine (ZANTAC) 150 MG tablet Take 150 mg by mouth daily.    Marland Kitchen aspirin EC 325 MG tablet Take 1 tablet (325 mg total) by mouth 2 (two) times daily. X 4 weeks 60 tablet 0  . diphenhydrAMINE (BENADRYL) 25 mg capsule Take 1 capsule (25 mg total) by mouth every 6 (six) hours as needed for itching, allergies or sleep. (Patient not taking: Reported on 01/13/2015) 30 capsule   . docusate sodium 100 MG CAPS Take 100 mg by mouth 2 (two) times daily. (Patient not taking: Reported on 01/13/2015) 10 capsule   . HYDROcodone-acetaminophen (NORCO) 7.5-325 MG per tablet Take 1-2 tablets by mouth every 4 (four) hours as needed for pain. (Patient not taking: Reported on 01/13/2015) 120 tablet 0  . methocarbamol (ROBAXIN) 500 MG tablet Take 1 tablet (  500 mg total) by mouth every 6 (six) hours as needed (muscle spasms). (Patient not taking: Reported on 01/13/2015)    . polyethylene glycol (MIRALAX / GLYCOLAX) packet Take 17 g by mouth 2 (two) times daily. (Patient not taking: Reported on 01/13/2015) 14 each      Physical Exam: left shoulder with painful and restricted motion as noted at recent office visits  Vitals  Temp:  [98.6 F (37 C)] 98.6 F (37 C) (11/03 0601) Pulse Rate:  [80] 80 (11/03 0601) Resp:  [18] 18 (11/03 0601) BP: (157)/(63) 157/63 mmHg (11/03 0601) SpO2:  [100 %] 100 % (11/03 0601)  Assessment/Plan  Impression: OA LEFT SHOULDER   Plan of Action: Procedure(s): LEFT TOTAL SHOULDER ARTHROPLASTY  Bon Dowis M Theus Espin 01/21/2015, 7:16 AM Contact # [phone]

## 2015-01-22 ENCOUNTER — Encounter (HOSPITAL_COMMUNITY): Payer: Self-pay | Admitting: Orthopedic Surgery

## 2015-01-22 DIAGNOSIS — M19012 Primary osteoarthritis, left shoulder: Secondary | ICD-10-CM | POA: Diagnosis not present

## 2015-01-22 MED ORDER — ONDANSETRON HCL 4 MG PO TABS: 4.0000 mg | ORAL_TABLET | Freq: Three times a day (TID) | ORAL | Status: DC | PRN

## 2015-01-22 MED ORDER — IBUPROFEN 800 MG PO TABS: 800.0000 mg | ORAL_TABLET | Freq: Three times a day (TID) | ORAL | Status: DC

## 2015-01-22 MED ORDER — OXYCODONE-ACETAMINOPHEN 5-325 MG PO TABS
1.0000 | ORAL_TABLET | ORAL | Status: DC | PRN
Start: 2015-01-22 — End: 2018-11-14

## 2015-01-22 MED ORDER — HYDROMORPHONE HCL 2 MG PO TABS
2.0000 mg | ORAL_TABLET | ORAL | Status: DC | PRN
Start: 2015-01-22 — End: 2018-11-14

## 2015-01-22 MED ORDER — DIAZEPAM 5 MG PO TABS
2.5000 mg | ORAL_TABLET | Freq: Four times a day (QID) | ORAL | Status: DC | PRN
Start: 2015-01-22 — End: 2018-11-14

## 2015-01-22 NOTE — Evaluation (Signed)
Occupational Therapy Evaluation Patient Details Name: Denise [name] MRN: [id number] DOB: 10/16/1950 Today's Date: 01/22/2015    History of Present Illness 64 y.o. admitted on 01/21/2015 with a chief complaint of severe left shoulder pain with failure to improve with conservative measures, and found to have a diagnosis of AO LEFT SHOULDER . They were brought to the operating room on 01/21/2015 and underwent Procedure(s): LEFT TOTAL SHOULDER ARTHROPLASTY.  PMH of HTN, anemia, GERD, DM   Clinical Impression   Pt reports she was independent with ADLs PTA. Currently pt is min A overall for ADLs and min guard for functional mobility. Session this AM limited by pt having nausea without emesis; RN notified. Began shoulder and ADL education; pt verbalized understanding. Pt plan to d/c home with 24/7 supervision from family. Pt would benefit from continued skilled OT in order to maximize independence and safety with UB ADLs and HEP prior to d/c home.     Follow Up Recommendations  Supervision/Assistance - 24 hour (follow up per MD)    Equipment Recommendations  None recommended by OT    Recommendations for Other Services       Precautions / Restrictions Precautions Precautions: Shoulder;Fall Type of Shoulder Precautions: Passive protocol: AROM elbow, wrist, hand OK. PROM shoulder FF 90, ABD 60, ER 30-no AROM shoulder. Shoulder Interventions: Shoulder sling/immobilizer;At all times;Off for dressing/bathing/exercises Precaution Booklet Issued: Yes (comment) Precaution Comments: Reviewed precautions Required Braces or Orthoses: Sling Restrictions Weight Bearing Restrictions: Yes LUE Weight Bearing: Non weight bearing      Mobility Bed Mobility Overal bed mobility: Needs Assistance Bed Mobility: Supine to Sit     Supine to sit: Supervision     General bed mobility comments: Supervision for safety. VC for NWB on LUE. No physical assist needed.  Transfers Overall transfer level: Needs  assistance Equipment used: None Transfers: Sit to/from Stand Sit to Stand: Min guard         General transfer comment: Min guard for safety. VC for hand placement. Sit to stand from EOB x 1, BSC x 1    Balance Overall balance assessment: Needs assistance Sitting-balance support: Single extremity supported;Feet supported Sitting balance-Leahy Scale: Good     Standing balance support: No upper extremity supported Standing balance-Leahy Scale: Fair                              ADL Overall ADL's : Needs assistance/impaired Eating/Feeding: Set up;Sitting   Grooming: Wash/dry hands;Min guard;Standing   Upper Body Bathing: Minimal assitance;Sitting   Lower Body Bathing: Minimal assistance;Sit to/from stand   Upper Body Dressing : Minimal assistance;Sitting   Lower Body Dressing: Minimal assistance;Sit to/from stand   Toilet Transfer: Min guard;Ambulation;BSC;Grab bars (BSC over toilet)   Toileting- Clothing Manipulation and Hygiene: Min guard;Sit to/from stand (for toilet hygiene)       Functional mobility during ADLs: Min guard General ADL Comments: No family present for OT eval. Educated on UB bathing and dressing technique, edema management techniques, sling management and wear schedule, positioning of L UE, shoulder precautions, NWB status; pt verbalized understanding. Pt with significant nausea without emesis when sitting EOB. Pt participated in functional mobility and toileting but nausea did not resolve; RN notified.      Vision     Perception     Praxis      Pertinent Vitals/Pain Pain Assessment: 0-10 Pain Score: 4  Pain Location: L shoulder Pain Descriptors / Indicators: Aching;Grimacing;Sore Pain Intervention(s): Limited activity  within patient's tolerance;Monitored during session;Repositioned;Ice applied     Hand Dominance Right   Extremity/Trunk Assessment Upper Extremity Assessment Upper Extremity Assessment: LUE deficits/detail LUE  Deficits / Details: elbow to hand ROM WFL LUE: Unable to fully assess due to pain;Unable to fully assess due to immobilization   Lower Extremity Assessment Lower Extremity Assessment: Overall WFL for tasks assessed   Cervical / Trunk Assessment Cervical / Trunk Assessment: Normal   Communication Communication Communication: No difficulties   Cognition Arousal/Alertness: Awake/alert Behavior During Therapy: WFL for tasks assessed/performed Overall Cognitive Status: Within Functional Limits for tasks assessed                     General Comments       Exercises Exercises: Shoulder     Shoulder Instructions Shoulder Instructions Donning/doffing shirt without moving shoulder:  (education completed ) Method for sponge bathing under operated UE:  (education completed ) Donning/doffing sling/immobilizer:  (education completed ) Correct positioning of sling/immobilizer: Minimal assistance (education completed ) Sling wearing schedule (on at all times/off for ADL's): Supervision/safety (education completed ) Proper positioning of operated UE when showering: Minimal assistance (education completed ) Positioning of UE while sleeping: Minimal assistance (education completed )    Home Living Family/patient expects to be discharged to:: Private residence Living Arrangements: Parent (Mom in her 7s but can provide physical assist ) Available Help at Discharge: Family;Available 24 hours/day (brother and sister live next door) Type of Home: House Home Access: Stairs to enter [employer] of Steps: 3 Entrance Stairs-Rails: Right;Left Home Layout: One level     Bathroom Shower/Tub: Occupational psychologist: Handicapped height Bathroom Accessibility: Yes   Home Equipment: Environmental consultant - 2 wheels;Cane - single point;Bedside commode;Shower seat - built in;Grab bars - tub/shower          Prior Functioning/Environment Level of Independence: Independent with assistive  device(s)        Comments: Pt reports she used cane sometimes when at work    OT Diagnosis: Generalized weakness;Acute pain   OT Problem List: Decreased strength;Decreased activity tolerance;Impaired balance (sitting and/or standing);Decreased safety awareness;Decreased knowledge of use of DME or AE;Decreased knowledge of precautions;Pain;Obesity   OT Treatment/Interventions: Self-care/ADL training;Therapeutic exercise;DME and/or AE instruction;Patient/family education    OT Goals(Current goals can be found in the care plan section) Acute Rehab OT Goals Patient Stated Goal: not feel so sick OT Goal Formulation: With patient Time For Goal Achievement: 02/05/15 Potential to Achieve Goals: Good ADL Goals Pt Will Perform Upper Body Bathing: with supervision;sitting Pt Will Perform Upper Body Dressing: with supervision;sitting Pt/caregiver will Perform Home Exercise Program: Increased ROM;Left upper extremity;Independently;With written HEP provided  OT Frequency: Min 2X/week   Barriers to D/C:            Co-evaluation              End of Session Equipment Utilized During Treatment: Other (comment);Gait belt (sling) Nurse Communication: Other (comment) (pt with nausea, OT needs to see 1 more time before d/c)  Activity Tolerance: Other (comment) (Pt limited by nausea) Patient left: in chair;with call bell/phone within reach   Time: 0834-0902 OT Time Calculation (min): 28 min Charges:  OT General Charges $OT Visit: 1 Procedure OT Evaluation $Initial OT Evaluation Tier I: 1 Procedure OT Treatments $Self Care/Home Management : 8-22 mins G-Codes: OT G-codes **NOT FOR INPATIENT CLASS** Functional Assessment Tool Used: Clinical judgement Functional Limitation: Self care Self Care Current Status (O3500): At least 1 percent but less  than 20 percent impaired, limited or restricted Self Care Goal Status (P5374): At least 1 percent but less than 20 percent impaired, limited or  restricted   Binnie Kand M.S., OTR/L Pager: [phone]  01/22/2015, 9:18 AM

## 2015-01-22 NOTE — Discharge Summary (Signed)
PATIENT ID:      Denise [name]  MRN:     [id number] DOB/AGE:    63-Aug-1952 / 63 y.o.     DISCHARGE SUMMARY  ADMISSION DATE:    01/21/2015 DISCHARGE DATE:    ADMISSION DIAGNOSIS: AO LEFT SHOULDER  Past Medical History  Diagnosis Date  . HTN (hypertension)   . Anemia   . HTN (hypertension)   . Arthritis   . Diabetes mellitus     diet controlled  . Shortness of breath dyspnea     with exertion  . GERD (gastroesophageal reflux disease)     DISCHARGE DIAGNOSIS:   Active Problems:   S/P shoulder replacement   PROCEDURE: Procedure(s): LEFT TOTAL SHOULDER ARTHROPLASTY on 01/21/2015  CONSULTS:   none  HISTORY:  See H&P in chart.  HOSPITAL COURSE:  Denise [name] is a 64 y.o. admitted on 01/21/2015 with a chief complaint of severe left shoulder pain with failure to improve with conservative measures, and found to have a diagnosis of AO LEFT SHOULDER .  They were brought to the operating room on 01/21/2015 and underwent Procedure(s): LEFT TOTAL SHOULDER ARTHROPLASTY.    They were given perioperative antibiotics: Anti-infectives    Start     Dose/Rate Route Frequency Ordered Stop   01/21/15 1400  ceFAZolin (ANCEF) IVPB 2 g/50 mL premix     2 g 100 mL/hr over 30 Minutes Intravenous Every 6 hours 01/21/15 1344 01/22/15 0202   01/21/15 0700  ceFAZolin (ANCEF) 3 g in dextrose 5 % 50 mL IVPB     3 g 160 mL/hr over 30 Minutes Intravenous To Surgery 01/20/15 1054 01/21/15 0755    .  Patient underwent the above named procedure and tolerated it well. The following day they were hemodynamically stable and pain was controlled on oral analgesics. She had a single elevated temp but had not used her incentive spirometer yet so this was encouraged.They were neurovascularly intact to the operative extremity. OT was ordered and worked with patient per protocol. They were medically and orthopaedically stable for discharge on day 1.    DIAGNOSTIC STUDIES:  RECENT RADIOGRAPHIC STUDIES :  No results  found.  RECENT VITAL SIGNS:  Patient Vitals for the past 24 hrs:  BP Temp Temp src Pulse Resp SpO2  01/22/15 0500 (!) 144/78 mmHg (!) 101.1 F (38.4 C) Oral 100 18 97 %  01/22/15 0100 100/75 mmHg 98.1 F (36.7 C) Oral 95 18 97 %  01/21/15 2100 135/85 mmHg 97.6 F (36.4 C) Oral 90 18 100 %  01/21/15 1346 137/82 mmHg 97.6 F (36.4 C) Oral 87 18 100 %  01/21/15 1330 - - - 100 20 94 %  01/21/15 1315 138/88 mmHg - - - - -  01/21/15 1300 132/85 mmHg - - 73 11 100 %  01/21/15 1245 (!) 134/91 mmHg - - 90 12 98 %  01/21/15 1230 (!) 135/92 mmHg - - 76 (!) 8 99 %  01/21/15 1215 128/88 mmHg - - 71 11 100 %  01/21/15 1200 - - - 77 14 100 %  01/21/15 1145 - - - 91 17 96 %  01/21/15 1130 (!) 159/88 mmHg - - 78 12 100 %  01/21/15 1115 (!) 155/90 mmHg - - 90 12 99 %  01/21/15 1100 (!) 146/93 mmHg - - 86 11 99 %  01/21/15 1045 (!) 141/85 mmHg - - 84 (!) 21 97 %  01/21/15 1030 (!) 141/102 mmHg - - 93 16 96 %  01/21/15 1025 (!)  147/96 mmHg 97.9 F (36.6 C) - 98 18 96 %  .  RECENT EKG RESULTS:    Orders placed or performed during the hospital encounter of 01/14/15  . EKG 12 lead  . EKG 12 lead    DISCHARGE INSTRUCTIONS:  Discharge Instructions    Discontinue IV    Complete by:  As directed            DISCHARGE MEDICATIONS:     Medication List    STOP taking these medications        HYDROcodone-acetaminophen 7.5-325 MG tablet  Commonly known as:  NORCO      TAKE these medications        amLODipine 5 MG tablet  Commonly known as:  NORVASC  Take 5 mg by mouth every morning.     aspirin EC 81 MG tablet  Take 81 mg by mouth daily.     diazepam 5 MG tablet  Commonly known as:  VALIUM  Take 0.5-1 tablets (2.5-5 mg total) by mouth every 6 (six) hours as needed for muscle spasms or sedation.     diphenhydrAMINE 25 mg capsule  Commonly known as:  BENADRYL  Take 1 capsule (25 mg total) by mouth every 6 (six) hours as needed for itching, allergies or sleep.     DSS 100 MG Caps   Take 100 mg by mouth 2 (two) times daily.     ferrous sulfate 325 (65 FE) MG tablet  Take 1 tablet (325 mg total) by mouth 3 (three) times daily after meals.     HYDROmorphone 2 MG tablet  Commonly known as:  DILAUDID  Take 1 tablet (2 mg total) by mouth every 4 (four) hours as needed for severe pain (for severe pain not controlled with percocet, use instead of).     ibuprofen 200 MG tablet  Commonly known as:  ADVIL,MOTRIN  Take 800 mg by mouth 2 (two) times daily.     methocarbamol 500 MG tablet  Commonly known as:  ROBAXIN  Take 1 tablet (500 mg total) by mouth every 6 (six) hours as needed (muscle spasms).     multivitamin with minerals Tabs tablet  Take 1 tablet by mouth every morning.     ondansetron 4 MG tablet  Commonly known as:  ZOFRAN  Take 1 tablet (4 mg total) by mouth every 8 (eight) hours as needed for nausea or vomiting.     oxyCODONE-acetaminophen 5-325 MG tablet  Commonly known as:  PERCOCET  Take 1-2 tablets by mouth every 4 (four) hours as needed.     polyethylene glycol packet  Commonly known as:  MIRALAX / GLYCOLAX  Take 17 g by mouth 2 (two) times daily.     ranitidine 150 MG tablet  Commonly known as:  ZANTAC  Take 150 mg by mouth daily.     TUMS PO  Take 2 tablets by mouth 2 (two) times daily.     Vitamin B-12 2500 MCG Subl  Place 2,500 mcg under the tongue every morning.     VITAMIN D PO  Take 1 tablet by mouth daily.        FOLLOW UP VISIT:       Follow-up Information    Follow up with Metta Clines SUPPLE, MD.   Specialty:  Orthopedic Surgery   Why:  call to be seen in 10-14 days   Contact information:   [address] [place] [address] [phone]       DISCHARGE  TO: Home  DISPOSITION: Good  DISCHARGE CONDITION:  Good   Yulia Ulrich for Dr. Lennette Bihari Supple 01/22/2015, 8:04 AM

## 2015-01-22 NOTE — Evaluation (Signed)
Physical Therapy Evaluation Patient Details Name: Denise [name] MRN: [id number] DOB: January 01, 1951 Today's Date: 01/22/2015   History of Present Illness  64 y.o. admitted on 01/21/2015 with a chief complaint of severe left shoulder pain with failure to improve with conservative measures, and found to have a diagnosis of AO LEFT SHOULDER . They were brought to the operating room on 01/21/2015 and underwent Procedure(s): LEFT TOTAL SHOULDER ARTHROPLASTY.  PMH of HTN, anemia, GERD, DM  Clinical Impression  Pt is getting up from chair but needs significant help to manage this and is weaker in RLE.  Her PLOF was SPC and did need steadying on hallway.  Due to residual RLE issues and new L shoulder surgery, recommend her to have outpatient PT.    Follow Up Recommendations Outpatient PT;Supervision/Assistance - 24 hour    Equipment Recommendations  None recommended by PT    Recommendations for Other Services Rehab consult     Precautions / Restrictions Precautions Precautions: Shoulder;Fall Type of Shoulder Precautions: Passive protocol: AROM elbow, wrist, hand OK. PROM shoulder FF 90, ABD 60, ER 30-no AROM shoulder. Shoulder Interventions: Shoulder sling/immobilizer;At all times;Off for dressing/bathing/exercises Precaution Booklet Issued: Yes (comment) Precaution Comments: Reviewed precautions Required Braces or Orthoses: Sling Restrictions Weight Bearing Restrictions: Yes LUE Weight Bearing: Non weight bearing      Mobility  Bed Mobility Overal bed mobility: Needs Assistance Bed Mobility: Supine to Sit     Supine to sit: Supervision     General bed mobility comments: up when PT entered  Transfers Overall transfer level: Needs assistance Equipment used: Hemi-walker Transfers: Sit to/from [employer] Sit to Stand: Min guard Stand pivot transfers: Min guard       General transfer comment: unsteady on initial standing   Ambulation/Gait Ambulation/Gait  assistance: Min guard Ambulation Distance (Feet): 100 Feet Assistive device: 1 person hand held assist;Hemi-walker (tried both) Gait Pattern/deviations: Step-through pattern;Wide base of support;Drifts right/left;Antalgic Gait velocity: reduced Gait velocity interpretation: Below normal speed for age/gender General Gait Details: old R THA with some residual gait changes  Stairs            Wheelchair Mobility    Modified Rankin (Stroke Patients Only)       Balance Overall balance assessment: Needs assistance Sitting-balance support: Feet supported Sitting balance-Leahy Scale: Good   Postural control: Posterior lean Standing balance support: Single extremity supported Standing balance-Leahy Scale: Fair Standing balance comment: fair- dynamic balance                             Pertinent Vitals/Pain Pain Assessment: 0-10 Pain Score: 2  Pain Location: L shoulder Pain Descriptors / Indicators: Aching;Operative site guarding Pain Intervention(s): Limited activity within patient's tolerance;Monitored during session;Premedicated before session;Repositioned;Ice applied    Home Living Family/patient expects to be discharged to:: Private residence Living Arrangements: Parent Available Help at Discharge: Family;Available 24 hours/day Type of Home: House Home Access: Stairs to enter Entrance Stairs-Rails: Psychiatric nurse of Steps: 3 Home Layout: One level Home Equipment: Walker - 2 wheels;Cane - single point;Bedside commode;Grab bars - toilet;Grab bars - tub/shower      Prior Function Level of Independence: Independent with assistive device(s)         Comments: cane as needed     Hand Dominance   Dominant Hand: Right    Extremity/Trunk Assessment   Upper Extremity Assessment: LUE deficits/detail       LUE Deficits / Details: arm in a sling for  protection of Total shoulder   Lower Extremity Assessment: RLE deficits/detail;LLE  deficits/detail RLE Deficits / Details: 4- to 4+ strength LLE Deficits / Details: 4+ to 5 strength    Cervical / Trunk Assessment: Normal  Communication   Communication: No difficulties  Cognition Arousal/Alertness: Awake/alert Behavior During Therapy: WFL for tasks assessed/performed Overall Cognitive Status: Within Functional Limits for tasks assessed                      General Comments General comments (skin integrity, edema, etc.): Pt has clean L shoulder skin with waterproof dressing overlying.  No signs of bleeding    Exercises Donning/doffing shirt without moving shoulder:  (education completed ) Method for sponge bathing under operated UE:  (education completed ) Donning/doffing sling/immobilizer:  (education completed ) Correct positioning of sling/immobilizer: Minimal assistance (education completed ) Sling wearing schedule (on at all times/off for ADL's): Supervision/safety (education completed ) Proper positioning of operated UE when showering: Minimal assistance (education completed ) Positioning of UE while sleeping: Minimal assistance (education completed )      Assessment/Plan    PT Assessment Patient needs continued PT services  PT Diagnosis Difficulty walking;Generalized weakness   PT Problem List Decreased strength;Decreased range of motion;Decreased activity tolerance;Decreased balance;Decreased mobility;Decreased coordination;Decreased knowledge of use of DME;Decreased safety awareness;Decreased knowledge of precautions;Obesity;Pain;Decreased skin integrity  PT Treatment Interventions DME instruction;Gait training;Stair training;Functional mobility training;Therapeutic activities;Therapeutic exercise;Balance training;Neuromuscular re-education;Patient/family education   PT Goals (Current goals can be found in the Care Plan section) Acute Rehab PT Goals Patient Stated Goal: to get home walking well and feel better PT Goal Formulation: With  patient/family Time For Goal Achievement: 01/29/15 Potential to Achieve Goals: Good    Frequency Min 4X/week   Barriers to discharge   family prepared to assist pt    Co-evaluation               End of Session Equipment Utilized During Treatment: Gait belt Activity Tolerance: Patient tolerated treatment well Patient left: in chair;with call bell/phone within reach;with family/visitor present Nurse Communication: Mobility status    Functional Assessment Tool Used: clinical judgment Functional Limitation: Mobility: Walking and moving around Mobility: Walking and Moving Around Current Status [phone]): At least 1 percent but less than 20 percent impaired, limited or restricted Mobility: Walking and Moving Around Goal Status [phone]): At least 1 percent but less than 20 percent impaired, limited or restricted    Time: 1012-1038 PT Time Calculation (min) (ACUTE ONLY): 26 min   Charges:   PT Evaluation $Initial PT Evaluation Tier I: 1 Procedure PT Treatments $Gait Training: 8-22 mins   PT G Codes:   PT G-Codes **NOT FOR INPATIENT CLASS** Functional Assessment Tool Used: clinical judgment Functional Limitation: Mobility: Walking and moving around Mobility: Walking and Moving Around Current Status (R6789): At least 1 percent but less than 20 percent impaired, limited or restricted Mobility: Walking and Moving Around Goal Status [phone]): At least 1 percent but less than 20 percent impaired, limited or restricted    Ramond Dial 01/22/2015, 11:32 AM   Mee Hives, PT MS Acute Rehab Dept. Number: [facility] [id number] and [place] [phone]

## 2015-01-22 NOTE — Progress Notes (Signed)
Pt discharge education and instructions completed with pt and family at bedside; both voices understanding and denies any questions. Pt IV removed; sling on with ice intact; incision dsg clean, dry and intact. Pt medicated prior to discharge; pt discharge home with family to transport her home. Pt handed her prescriptions for valium, dilaudid, ibuprofen, zofran and percocet. Pt transported off unit via wheelchair with belongings and daughter to the side. Francis Gaines Joelle Flessner RN.

## 2015-01-22 NOTE — Op Note (Signed)
Denise [name], [name]                 ACCOUNT NO.:  [id number]  MEDICAL RECORD NO.:  [id number]  LOCATION:  5N10C                        FACILITY:  [facility]  PHYSICIAN:  Denise [name], M.D.  DATE OF BIRTH:  1950-05-10  DATE OF PROCEDURE:  01/21/2015 DATE OF DISCHARGE:                              OPERATIVE REPORT   PREOPERATIVE DIAGNOSIS:  End-stage left shoulder osteoarthritis.  POSTOPERATIVE DIAGNOSIS:  End-stage left shoulder osteoarthritis.  PROCEDURE:  Left total shoulder arthroplasty utilizing a DePuy size 12 modular stem with a 44 x 18 eccentric head Denise a 40 pegged cemented glenoid.  SURGEON:  Denise Clines. Denise [name], M.D.  Denise DupontOlivia Mackie A. [name], P.A.-C.  ANESTHESIA:  General endotracheal as well as interscalene block.  ESTIMATED BLOOD LOSS:  200 mL.  DRAINS:  None.  HISTORY:  Denise [name] is a 64 year old female who has had chronic Denise progressively increasing bilateral shoulder pain with end-stage osteoarthritis of left, currently much more symptomatic than the right. Due to her increasing pain Denise functional limitations, she was brought to the operating room at this time for planned left total shoulder arthroplasty.  Preoperatively, I counseled Denise [name] regarding treatment options Denise potential risks versus benefits thereof.  Possible surgical complications were reviewed including bleeding, infection, neurovascular injury, persistent pain, loss of motion, anesthetic complications, failure of the implant, possible need for additional surgery.  She understands Denise accepts Denise agrees with our planned procedure.  PROCEDURE IN DETAIL:  After undergoing routine preop evaluation, the patient received prophylactic antibiotics Denise an interscalene block was established in the holding area by the Anesthesia Department.  Placed supine on the operating table, underwent smooth induction of a general endotracheal anesthesia.  Placed into beach-chair position Denise appropriately  padded Denise protected.  Left shoulder girdle region was sterilely prepped Denise draped in standard fashion.  Time-out was called. An anterior deltopectoral approach to the left shoulder was made through a 10-cm incision anteriorly.  Skin flaps were elevated Denise electrocautery was used for hemostasis.  Dissection carried deeply down to the deltopectoral interval, which we developed bluntly from proximal to distal.  The upper centimeter Denise a half of the pectoralis major tendon was tenotomized to enhance exposure.  Adhesion was divided beneath the deltoid.  The conjoint tendon was identified, mobilized Denise retracted medially.  We then unroofed the long-head biceps tendon, which was then tenotomized for later tenodesis Denise then split the rotator cuff along the rotator interval to the base of the coracoid Denise then divided the subscap away from the lesser tuberosity leaving a 1 cm cuff of tissue for later repair Denise the free margin was tagged with a series of #2 FiberWire sutures, grasping technique.  We then divided the capsular attachments from the anteroinferior Denise inferior aspects of the humeral head allowing [place] to deliver the head through the wound.  We used the extramedullary guide to outline our proposed humeral head resection.  We then performed at approximately 30 degrees of retroversion maintaining the native retroversion Denise carefully protecting the rotator cuff superiorly Denise posteriorly.  We then performed the hand reaming of the humeral canal up to size 12, broached up to size 12 Denise the size  12 trial showed excellent fit Denise fixation.  We then removed the osteophytes from the margin of the humeral neck anteriorly Denise inferiorly.  Metal cap was placed to the cut surface of the proximal humerus.  We then exposed the glenoid with combination of pitchfork, snake tongue Denise [name] retractors.  We performed a circumferential labral resection gaining exposure of the entire periphery of  the glenoid Denise also removing the proximal stump of the biceps.  Once we had complete exposure, we placed a central guidepin Denise reamed the glenoid to a stable bed with the size 40 showing the proper fit.  Residual soft tissue at the margin of the glenoid was then removed Denise we did mobilize the subscapularis as well.  At this point, we then placed a central drill hole followed by the peripheral peg holes Denise the size 40 trial glenoid showed excellent fit Denise fixation.  The glenoid was then copiously irrigated, cleaned Denise dried.  We mixed cement Denise introduced cement into the peripheral peg holes Denise then implanted a final 40 pegged glenoid with excellent fit Denise fixation.  The cement was allowed to harden.  We then returned our attention to the proximal humerus where we implanted our size 12 stem to the appropriate level.  Once again, performed a series of trial reductions Denise the 44 x 18 eccentric head showed approximately 50% translation of the humeral head Denise glenoid Denise in overall good soft tissue balance Denise good mobility.  The final 44 x 18 eccentric head was then impacted onto the [facility] taper.  Final reduction was performed.  We then repaired the subscapularis back to the lesser tuberosity using the #2 FiberWires.  The rotator interval was reapproximated with pair of figure-of-eight #2 FiberWire sutures.  The biceps tendon was then tenodesed at the level of the upper pectoralis major with #2 FiberWire.  The wound was then copiously irrigated. Hemostasis was obtained.  The deltopectoral interval was reapproximated with series of figure-of-eight #1 Vicryl sutures, 2-0 Monocryl was used for the subcu layer Denise intracuticular 3-0 Monocryl for the skin followed by Dermabond Denise Aquacel dressing.  The left arm was then placed into a sling.  The patient was awakened, extubated, Denise taken to the recovery room in stable condition.  Denise A. Shuford, PA-C was used as an Environmental consultant  throughout this case Denise essential for help with positioning of the patient, positioning of the extremity, management of the retractors, tissue manipulation, implantation of the prosthesis, wound closer, Denise intraoperative decision making.     Denise Clines. Kathrine Rieves, M.D.     KMS/MEDQ  D:  01/21/2015  T:  01/22/2015  Job:  [id number]

## 2015-01-22 NOTE — Progress Notes (Signed)
Occupational Therapy Treatment Patient Details Name: Roshawn Lacina MRN: [id number] DOB: Jun 06, 1950 Today's Date: 01/22/2015    History of present illness 64 y.o. admitted on 01/21/2015 with a chief complaint of severe left shoulder pain with failure to improve with conservative measures, and found to have a diagnosis of AO LEFT SHOULDER . They were brought to the operating room on 01/21/2015 and underwent Procedure(s): LEFT TOTAL SHOULDER ARTHROPLASTY.  PMH of HTN, anemia, GERD, DM   OT comments  Pt making progress toward OT goals. Focus of session on L UE exercises; pt able to demonstrate understanding and teach back at end of session. Reviewed all ADL and shoulder education; pt with decreased recall of UB bathing/dressing technique. Pt verbalized understanding of education at end of session. Pt ready to d/c from an OT standpoint. Will continue to follow acutely.    Follow Up Recommendations  Supervision/Assistance - 24 hour (follow up per MD)    Equipment Recommendations  None recommended by OT    Recommendations for Other Services      Precautions / Restrictions Precautions Precautions: Shoulder;Fall Type of Shoulder Precautions: Passive protocol: AROM elbow, wrist, hand OK. PROM shoulder FF 90, ABD 60, ER 30-no AROM shoulder. Shoulder Interventions: Shoulder sling/immobilizer;At all times;Off for dressing/bathing/exercises Precaution Booklet Issued: Yes (comment) Precaution Comments: Reviewed precautions Required Braces or Orthoses: Sling Restrictions Weight Bearing Restrictions: Yes LUE Weight Bearing: Non weight bearing       Mobility Bed Mobility Overal bed mobility: Needs Assistance      General bed mobility comments: Pt in chair  Transfers     General transfer comment: Not assessed at this time    Balance                    ADL Overall ADL's : Needs assistance/impaired           Functional mobility during ADLs: Min guard General ADL Comments:  Sister present for OT session. Reviewed UB dressing/bathing technique, sling management and wear schedule, edema management techniques, home safety; pt verbalized understanding. Educated on L UE ROM exercises and need to complete exercises 3 x per day; pt verbalized understanding with teach back at end of session.       Vision                     Perception     Praxis      Cognition   Behavior During Therapy: WFL for tasks assessed/performed Overall Cognitive Status: Within Functional Limits for tasks assessed       Memory: Decreased recall of precautions               Extremity/Trunk Assessment  Upper Extremity Assessment Upper Extremity Assessment: LUE deficits/detail LUE Deficits / Details: arm in a sling for protection of Total shoulder LUE: Unable to fully assess due to pain;Unable to fully assess due to immobilization   Lower Extremity Assessment Lower Extremity Assessment: RLE deficits/detail;LLE deficits/detail RLE Deficits / Details: 4- to 4+ strength LLE Deficits / Details: 4+ to 5 strength     Cervical / Trunk Assessment Cervical / Trunk Assessment: Normal    Exercises Shoulder Exercises Shoulder Flexion: PROM;Left;10 reps;Supine (0-30 degrees) Shoulder ABduction: PROM;Left;10 reps;Supine (0-45 degrees) Shoulder External Rotation: PROM;Left;10 reps;Supine (0-10 degrees) Elbow Flexion: AROM;Left;10 reps;Supine (full ROM) Wrist Flexion: AROM;Left;10 reps;Supine (full ROM) Digit Composite Flexion: AROM;Left;10 reps;Supine (full ROM) Donning/doffing shirt without moving shoulder: Moderate assistance Method for sponge bathing under operated UE: Moderate assistance Donning/doffing sling/immobilizer: Moderate assistance  Correct positioning of sling/immobilizer: Minimal assistance ROM for elbow, wrist and digits of operated UE: Supervision/safety Sling wearing schedule (on at all times/off for ADL's): Supervision/safety Proper positioning of operated  UE when showering: Minimal assistance Positioning of UE while sleeping: Minimal assistance   Shoulder Instructions Shoulder Instructions Donning/doffing shirt without moving shoulder: Moderate assistance Method for sponge bathing under operated UE: Moderate assistance Donning/doffing sling/immobilizer: Moderate assistance Correct positioning of sling/immobilizer: Minimal assistance ROM for elbow, wrist and digits of operated UE: Supervision/safety Sling wearing schedule (on at all times/off for ADL's): Supervision/safety Proper positioning of operated UE when showering: Minimal assistance Positioning of UE while sleeping: Minimal assistance     General Comments      Pertinent Vitals/ Pain       Pain Assessment: 0-10 Pain Score: 10-Worst pain ever Pain Location: L shoulder with exercises Pain Descriptors / Indicators: Aching;Grimacing;Guarding;Sore Pain Intervention(s): Limited activity within patient's tolerance;Monitored during session;Repositioned;Ice applied  Home Living Family/patient expects to be discharged to:: Private residence Living Arrangements: Parent Available Help at Discharge: Family;Available 24 hours/day Type of Home: House Home Access: Stairs to enter [employer] of Steps: 3 Entrance Stairs-Rails: Right;Left Home Layout: One level     Bathroom Shower/Tub: Occupational psychologist: Handicapped height Bathroom Accessibility: Yes   Home Equipment: Environmental consultant - 2 wheels;Cane - single point;Bedside commode;Grab bars - toilet;Grab bars - tub/shower          Prior Functioning/Environment Level of Independence: Independent with assistive device(s)        Comments: cane as needed   Frequency Min 2X/week     Progress Toward Goals  OT Goals(current goals can now be found in the care plan section)  Progress towards OT goals: Progressing toward goals  Acute Rehab OT Goals Patient Stated Goal: to go home today OT Goal Formulation: With  patient Time For Goal Achievement: 02/05/15 Potential to Achieve Goals: Good ADL Goals Pt Will Perform Upper Body Bathing: with supervision;sitting Pt Will Perform Upper Body Dressing: with supervision;sitting Pt/caregiver will Perform Home Exercise Program: Increased ROM;Left upper extremity;Independently;With written HEP provided  Plan Discharge plan remains appropriate    Co-evaluation                 End of Session Equipment Utilized During Treatment: Other (comment) (sling)   Activity Tolerance Patient limited by pain   Patient Left in chair;with call bell/phone within reach;with family/visitor present   Nurse Communication Patient requests pain meds;Other (comment) (pt ready to d/c from OT standpoint)    Functional Assessment Tool Used: Clinical judgement Functional Limitation: Self care Self Care Current Status (D7824): At least 1 percent but less than 20 percent impaired, limited or restricted Self Care Goal Status (M3536): At least 1 percent but less than 20 percent impaired, limited or restricted   Time: 1205-1222 OT Time Calculation (min): 17 min  Charges: OT G-codes **NOT FOR INPATIENT CLASS** Functional Assessment Tool Used: Clinical judgement Functional Limitation: Self care Self Care Current Status (R4431): At least 1 percent but less than 20 percent impaired, limited or restricted Self Care Goal Status (V4008): At least 1 percent but less than 20 percent impaired, limited or restricted OT General Charges $OT Visit: 1 Procedure OT Evaluation $Initial OT Evaluation Tier I: 1 Procedure OT Treatments $Self Care/Home Management : 8-22 mins $Therapeutic Exercise: 8-22 mins  Binnie Kand M.S., OTR/L Pager: [phone]  01/22/2015, 12:30 PM

## 2016-09-27 DIAGNOSIS — E118 Type 2 diabetes mellitus with unspecified complications: Secondary | ICD-10-CM | POA: Diagnosis not present

## 2016-09-27 DIAGNOSIS — R609 Edema, unspecified: Secondary | ICD-10-CM | POA: Diagnosis not present

## 2016-09-27 DIAGNOSIS — I10 Essential (primary) hypertension: Secondary | ICD-10-CM | POA: Diagnosis not present

## 2016-11-14 DIAGNOSIS — E119 Type 2 diabetes mellitus without complications: Secondary | ICD-10-CM | POA: Diagnosis not present

## 2016-11-14 DIAGNOSIS — R600 Localized edema: Secondary | ICD-10-CM | POA: Diagnosis not present

## 2016-11-14 DIAGNOSIS — I10 Essential (primary) hypertension: Secondary | ICD-10-CM | POA: Diagnosis not present

## 2016-11-22 DIAGNOSIS — R600 Localized edema: Secondary | ICD-10-CM | POA: Diagnosis not present

## 2016-11-22 DIAGNOSIS — E669 Obesity, unspecified: Secondary | ICD-10-CM | POA: Diagnosis not present

## 2016-11-22 DIAGNOSIS — I10 Essential (primary) hypertension: Secondary | ICD-10-CM | POA: Diagnosis not present

## 2017-06-04 DIAGNOSIS — R7303 Prediabetes: Secondary | ICD-10-CM | POA: Diagnosis not present

## 2017-06-04 DIAGNOSIS — I10 Essential (primary) hypertension: Secondary | ICD-10-CM | POA: Diagnosis not present

## 2017-06-04 DIAGNOSIS — M130 Polyarthritis, unspecified: Secondary | ICD-10-CM | POA: Diagnosis not present

## 2017-06-14 DIAGNOSIS — Z1231 Encounter for screening mammogram for malignant neoplasm of breast: Secondary | ICD-10-CM | POA: Diagnosis not present

## 2017-10-02 DIAGNOSIS — Z6841 Body Mass Index (BMI) 40.0 and over, adult: Secondary | ICD-10-CM | POA: Diagnosis not present

## 2017-10-02 DIAGNOSIS — R7303 Prediabetes: Secondary | ICD-10-CM | POA: Diagnosis not present

## 2017-10-02 DIAGNOSIS — I10 Essential (primary) hypertension: Secondary | ICD-10-CM | POA: Diagnosis not present

## 2017-10-02 DIAGNOSIS — D473 Essential (hemorrhagic) thrombocythemia: Secondary | ICD-10-CM | POA: Diagnosis not present

## 2017-10-02 DIAGNOSIS — M130 Polyarthritis, unspecified: Secondary | ICD-10-CM | POA: Diagnosis not present

## 2018-04-22 DIAGNOSIS — Z6841 Body Mass Index (BMI) 40.0 and over, adult: Secondary | ICD-10-CM | POA: Diagnosis not present

## 2018-04-22 DIAGNOSIS — R7303 Prediabetes: Secondary | ICD-10-CM | POA: Diagnosis not present

## 2018-04-22 DIAGNOSIS — I10 Essential (primary) hypertension: Secondary | ICD-10-CM | POA: Diagnosis not present

## 2018-05-14 DIAGNOSIS — I10 Essential (primary) hypertension: Secondary | ICD-10-CM | POA: Diagnosis not present

## 2018-05-14 DIAGNOSIS — R7303 Prediabetes: Secondary | ICD-10-CM | POA: Diagnosis not present

## 2018-10-01 DIAGNOSIS — Z1231 Encounter for screening mammogram for malignant neoplasm of breast: Secondary | ICD-10-CM | POA: Diagnosis not present

## 2018-10-07 DIAGNOSIS — R042 Hemoptysis: Secondary | ICD-10-CM | POA: Diagnosis not present

## 2018-10-07 DIAGNOSIS — I10 Essential (primary) hypertension: Secondary | ICD-10-CM | POA: Diagnosis not present

## 2018-10-07 DIAGNOSIS — R7303 Prediabetes: Secondary | ICD-10-CM | POA: Diagnosis not present

## 2018-10-14 ENCOUNTER — Other Ambulatory Visit: Payer: Self-pay | Admitting: Family Medicine

## 2018-10-14 ENCOUNTER — Other Ambulatory Visit (HOSPITAL_COMMUNITY): Payer: Self-pay | Admitting: Family Medicine

## 2018-10-14 DIAGNOSIS — R042 Hemoptysis: Secondary | ICD-10-CM

## 2018-10-17 ENCOUNTER — Other Ambulatory Visit: Payer: Self-pay

## 2018-10-17 ENCOUNTER — Ambulatory Visit (HOSPITAL_COMMUNITY)
Admission: RE | Admit: 2018-10-17 | Discharge: 2018-10-17 | Disposition: A | Discharge status: Home / Self Care | Payer: Medicare Other | Source: Ambulatory Visit | Attending: Family Medicine | Admitting: Family Medicine

## 2018-10-17 DIAGNOSIS — R918 Other nonspecific abnormal finding of lung field: Secondary | ICD-10-CM | POA: Diagnosis not present

## 2018-10-17 DIAGNOSIS — R042 Hemoptysis: Secondary | ICD-10-CM

## 2018-10-17 IMAGING — CT CT CHEST WITHOUT CONTRAST
2 of 4 series · 15 of 36 positions shown, 18 images · non-contrast
Comparison: None.

CLINICAL DATA: Coughing up bright red blood

EXAM:
CT CHEST WITHOUT CONTRAST
TECHNIQUE: Multidetector CT imaging of the chest was performed following the
standard protocol without IV contrast.

[Series 2: routine chest without · axial · non-contrast · 0.69mm/px · z∈[+1220,+1462]mm · 12 of 143 slices shown, 15 images]
[im 11/143  mediastinal]
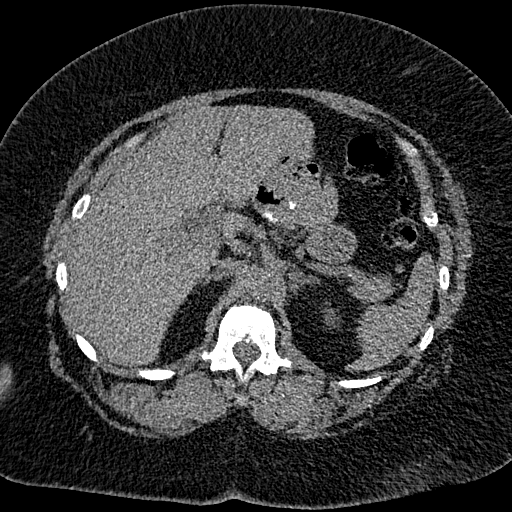
[im 11/143  lung]
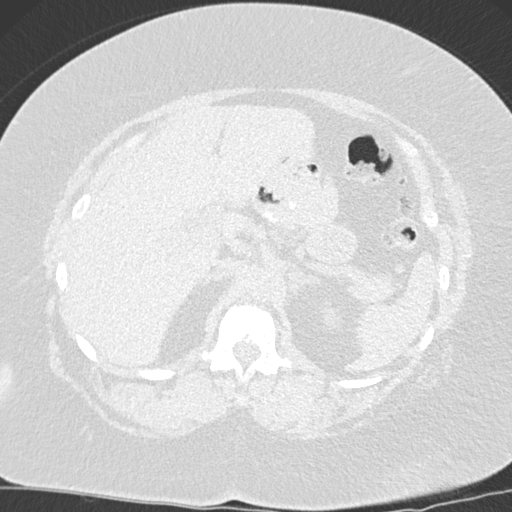
[im 22/143  lung]
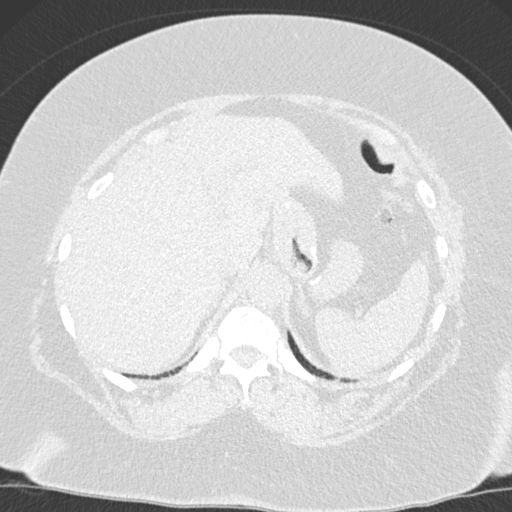
[im 33/143  lung]
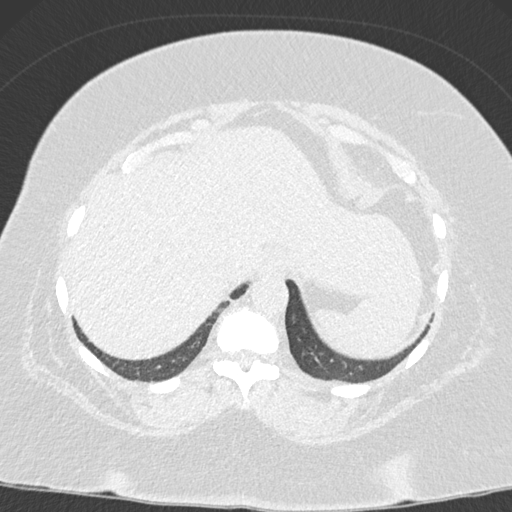
[im 44/143  lung]
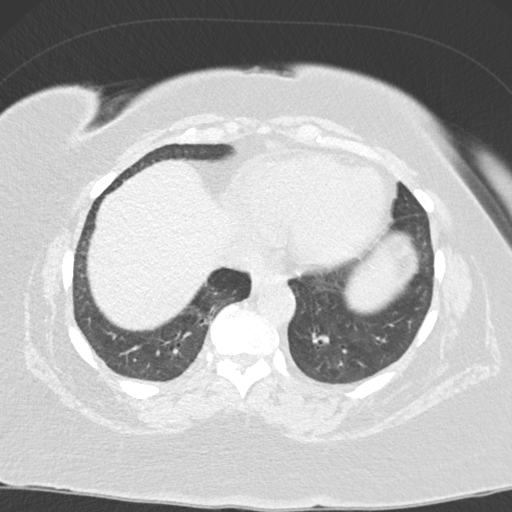
[im 55/143  mediastinal]
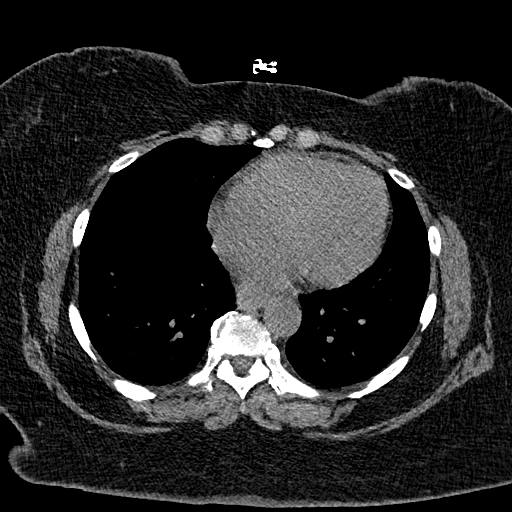
[im 55/143  lung]
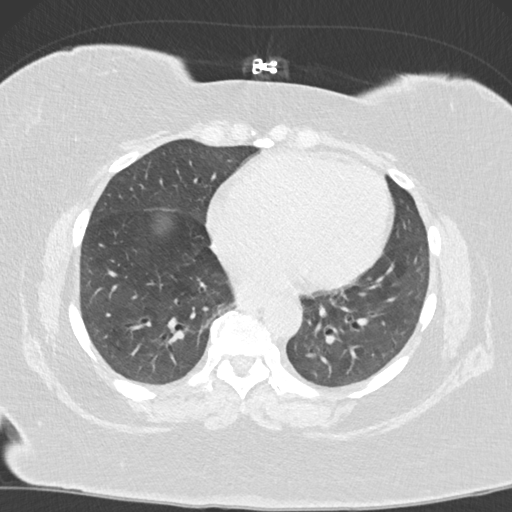
[im 66/143  lung]
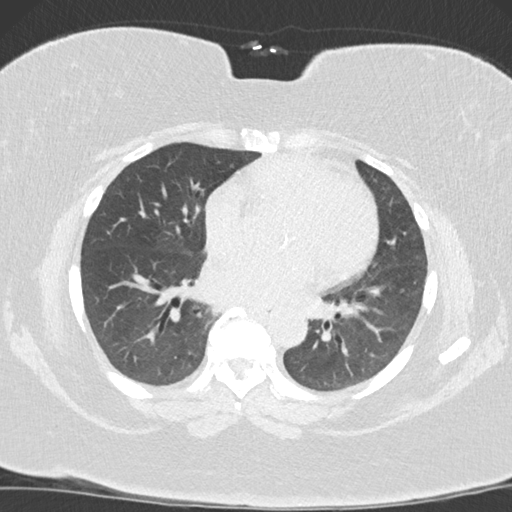
[im 77/143  lung]
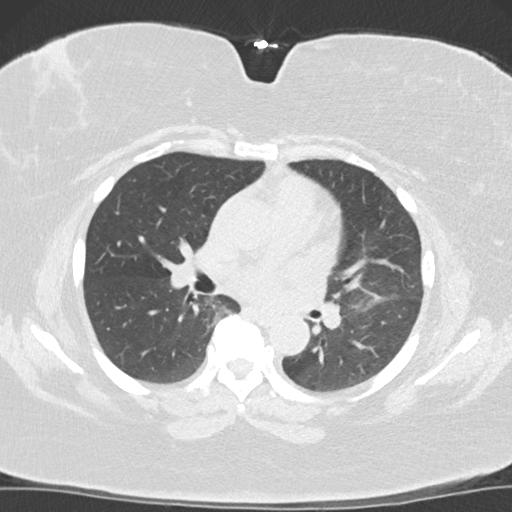
[im 88/143  lung]
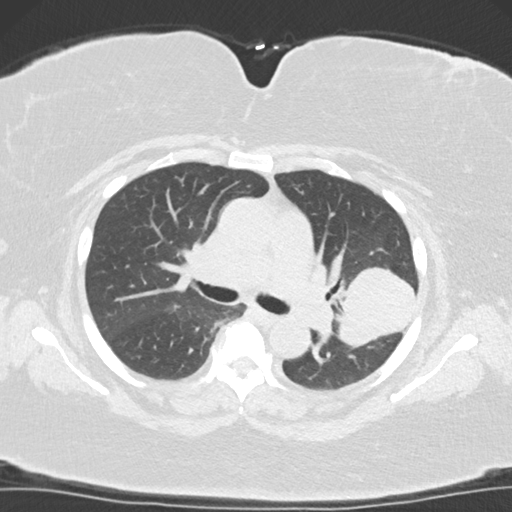
[im 99/143  mediastinal]
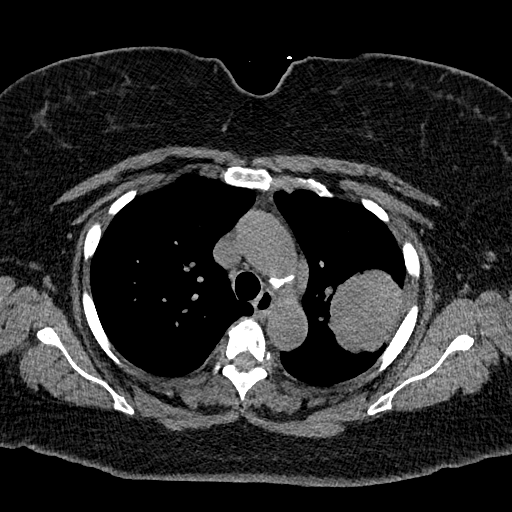
[im 99/143  lung]
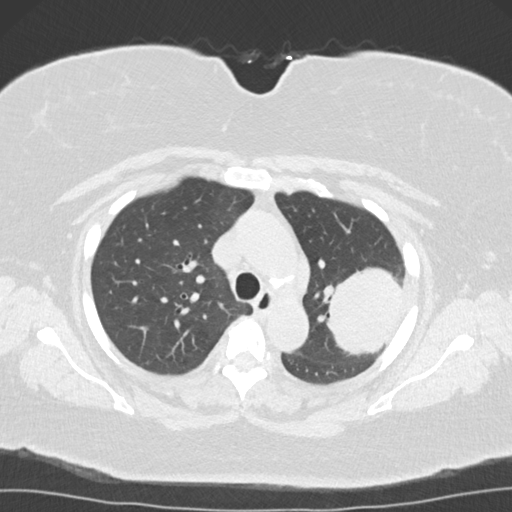
[im 110/143  lung]
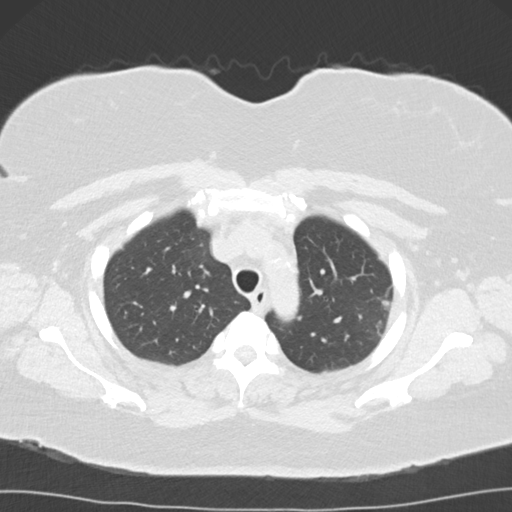
[im 121/143  lung]
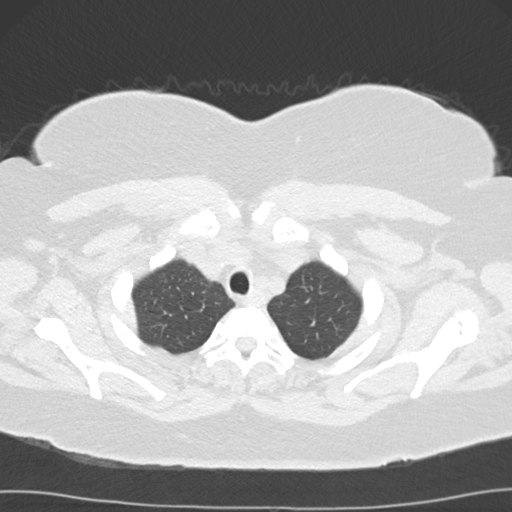
[im 132/143  lung]
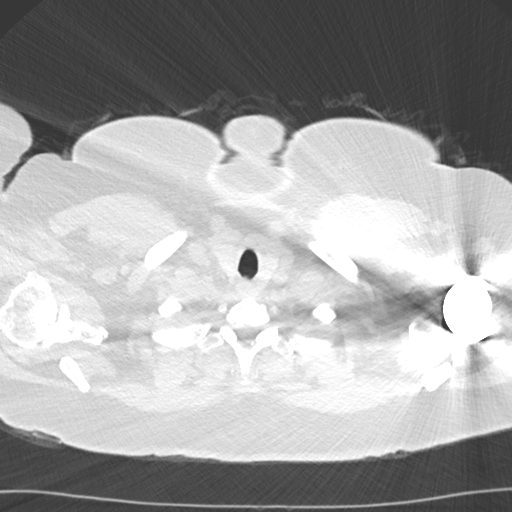

[Series 5: coronal · coronal · 0.59mm/px · 3 of 151 slices shown]
[im 31/151  lung]
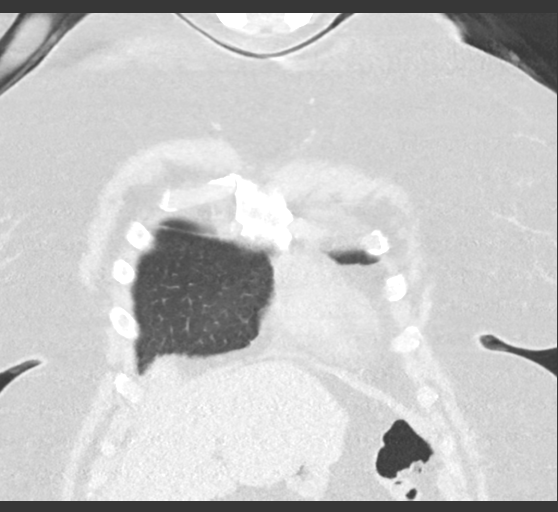
[im 61/151  lung]
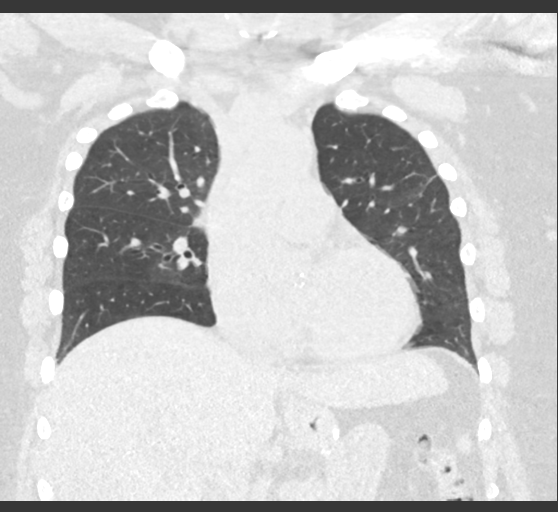
[im 91/151  lung]
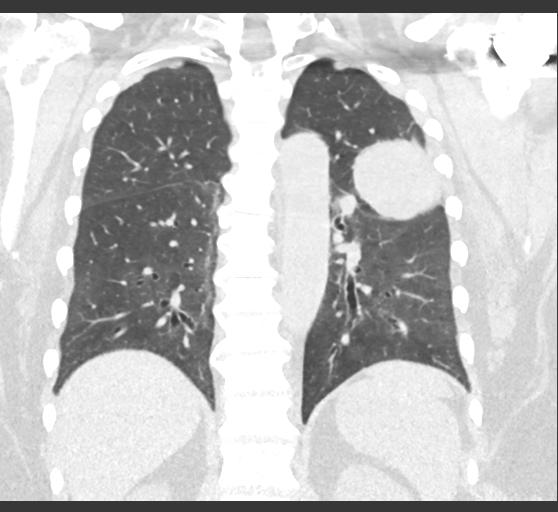

[15 of 36 positions shown; findings below may reference images not displayed]

FINDINGS: Cardiovascular: Aortic atherosclerosis. Normal heart size. No
pericardial effusion.

Mediastinum/Nodes: There is an enlarged AP window lymph node
measuring 3.0 x 1.2 cm. Thyroid gland, trachea, and esophagus
demonstrate no significant findings.

Lungs/Pleura: There is a large mass of the left upper lobe measuring
6.6 x 5.0 x 5.3 cm (series 2, image 48, series 5, image 83). No
pleural effusion or pneumothorax.

Upper Abdomen: No acute abnormality. Postoperative findings of
Roux-en-Y gastric bypass.

Musculoskeletal: No chest wall mass or suspicious bone lesions
identified. Status post left shoulder arthroplasty.
IMPRESSION: There is a large mass of the left upper lobe measuring 6.6 x 5.0 x
5.3 cm (series 2, image 48, series 5, image 83). There is an
enlarged AP window lymph node measuring 3.0 x 1.2 cm. Findings are
highly concerning for primary lung malignancy. This mass is amenable
to percutaneous CT-guided biopsy and PET-CT may be used to
characterize for metabolic activity of mass and lymph nodes.

## 2018-10-23 DIAGNOSIS — I10 Essential (primary) hypertension: Secondary | ICD-10-CM | POA: Diagnosis not present

## 2018-10-23 DIAGNOSIS — R7303 Prediabetes: Secondary | ICD-10-CM | POA: Diagnosis not present

## 2018-11-07 DIAGNOSIS — R9389 Abnormal findings on diagnostic imaging of other specified body structures: Secondary | ICD-10-CM | POA: Diagnosis not present

## 2018-11-07 DIAGNOSIS — I10 Essential (primary) hypertension: Secondary | ICD-10-CM | POA: Diagnosis not present

## 2018-11-07 DIAGNOSIS — E669 Obesity, unspecified: Secondary | ICD-10-CM | POA: Diagnosis not present

## 2018-11-08 ENCOUNTER — Other Ambulatory Visit (HOSPITAL_COMMUNITY): Payer: Self-pay | Admitting: Pulmonary Disease

## 2018-11-08 DIAGNOSIS — R918 Other nonspecific abnormal finding of lung field: Secondary | ICD-10-CM

## 2018-11-08 DIAGNOSIS — R911 Solitary pulmonary nodule: Secondary | ICD-10-CM

## 2018-11-16 ENCOUNTER — Other Ambulatory Visit (HOSPITAL_COMMUNITY)
Admission: RE | Admit: 2018-11-16 | Discharge: 2018-11-16 | Disposition: A | Discharge status: Home / Self Care | Payer: Medicare Other | Source: Ambulatory Visit | Attending: Pulmonary Disease | Admitting: Pulmonary Disease

## 2018-11-16 DIAGNOSIS — Z01812 Encounter for preprocedural laboratory examination: Secondary | ICD-10-CM | POA: Diagnosis not present

## 2018-11-16 DIAGNOSIS — Z20828 Contact with and (suspected) exposure to other viral communicable diseases: Secondary | ICD-10-CM | POA: Insufficient documentation

## 2018-11-16 LAB — SARS CORONAVIRUS 2 (TAT 6-24 HRS): SARS Coronavirus 2: NEGATIVE

## 2018-11-18 ENCOUNTER — Other Ambulatory Visit: Payer: Self-pay | Admitting: Physician Assistant

## 2018-11-19 ENCOUNTER — Ambulatory Visit (HOSPITAL_COMMUNITY)
Admission: RE | Admit: 2018-11-19 | Discharge: 2018-11-19 | Disposition: A | Discharge status: Home / Self Care | Payer: Medicare Other | Source: Ambulatory Visit | Attending: Pulmonary Disease | Admitting: Pulmonary Disease

## 2018-11-19 ENCOUNTER — Other Ambulatory Visit: Payer: Self-pay

## 2018-11-19 ENCOUNTER — Other Ambulatory Visit: Payer: Self-pay | Admitting: Radiology

## 2018-11-19 ENCOUNTER — Ambulatory Visit (HOSPITAL_COMMUNITY)
Admission: RE | Admit: 2018-11-19 | Discharge: 2018-11-19 | Disposition: A | Discharge status: Home / Self Care | Payer: Medicare Other | Source: Ambulatory Visit | Attending: Interventional Radiology | Admitting: Interventional Radiology

## 2018-11-19 ENCOUNTER — Encounter (HOSPITAL_COMMUNITY): Payer: Self-pay

## 2018-11-19 DIAGNOSIS — C3412 Malignant neoplasm of upper lobe, left bronchus or lung: Secondary | ICD-10-CM | POA: Diagnosis not present

## 2018-11-19 DIAGNOSIS — Z7901 Long term (current) use of anticoagulants: Secondary | ICD-10-CM | POA: Diagnosis not present

## 2018-11-19 DIAGNOSIS — M1990 Unspecified osteoarthritis, unspecified site: Secondary | ICD-10-CM | POA: Diagnosis not present

## 2018-11-19 DIAGNOSIS — R911 Solitary pulmonary nodule: Secondary | ICD-10-CM | POA: Insufficient documentation

## 2018-11-19 DIAGNOSIS — D649 Anemia, unspecified: Secondary | ICD-10-CM | POA: Diagnosis not present

## 2018-11-19 DIAGNOSIS — Z7982 Long term (current) use of aspirin: Secondary | ICD-10-CM | POA: Insufficient documentation

## 2018-11-19 DIAGNOSIS — Z87891 Personal history of nicotine dependence: Secondary | ICD-10-CM | POA: Insufficient documentation

## 2018-11-19 DIAGNOSIS — K219 Gastro-esophageal reflux disease without esophagitis: Secondary | ICD-10-CM | POA: Insufficient documentation

## 2018-11-19 DIAGNOSIS — I10 Essential (primary) hypertension: Secondary | ICD-10-CM | POA: Diagnosis not present

## 2018-11-19 DIAGNOSIS — R918 Other nonspecific abnormal finding of lung field: Secondary | ICD-10-CM | POA: Insufficient documentation

## 2018-11-19 DIAGNOSIS — E118 Type 2 diabetes mellitus with unspecified complications: Secondary | ICD-10-CM | POA: Diagnosis not present

## 2018-11-19 DIAGNOSIS — Z48813 Encounter for surgical aftercare following surgery on the respiratory system: Secondary | ICD-10-CM | POA: Diagnosis not present

## 2018-11-19 DIAGNOSIS — J939 Pneumothorax, unspecified: Secondary | ICD-10-CM

## 2018-11-19 DIAGNOSIS — Z79899 Other long term (current) drug therapy: Secondary | ICD-10-CM | POA: Insufficient documentation

## 2018-11-19 LAB — CBC
HCT: 36.9 % (ref 36.0–46.0)
Hemoglobin: 11.2 g/dL — ABNORMAL LOW (ref 12.0–15.0)
MCH: 25.3 pg — ABNORMAL LOW (ref 26.0–34.0)
MCHC: 30.4 g/dL (ref 30.0–36.0)
MCV: 83.3 fL (ref 80.0–100.0)
Platelets: 442 10*3/uL — ABNORMAL HIGH (ref 150–400)
RBC: 4.43 MIL/uL (ref 3.87–5.11)
RDW: 15.8 % — ABNORMAL HIGH (ref 11.5–15.5)
WBC: 7.9 10*3/uL (ref 4.0–10.5)
nRBC: 0 % (ref 0.0–0.2)

## 2018-11-19 LAB — PROTIME-INR
INR: 1.1 (ref 0.8–1.2)
Prothrombin Time: 14.3 seconds (ref 11.4–15.2)

## 2018-11-19 LAB — GLUCOSE, CAPILLARY: Glucose-Capillary: 99 mg/dL (ref 70–99)

## 2018-11-19 IMAGING — CT CT BIOPSY
3 of 11 series · 6 of 32 positions shown, 9 images · non-contrast
Comparison: Chest CT-[DATE]

INDICATION: No known primary, now with indeterminate left upper lobe pulmonary
mass. Please perform CT-guided biopsy for tissue diagnostic
purposes.

EXAM:
CT GUIDED LEFT UPPER LOBE MASS BIOPSY

[Series 2: i-spiral 5.0 b40f · axial · 0.98mm/px · z∈[+1322,+1361]mm · 2 of 35 slices shown, 5 images (1 of 3)]
[im 12/35  soft-tissue]
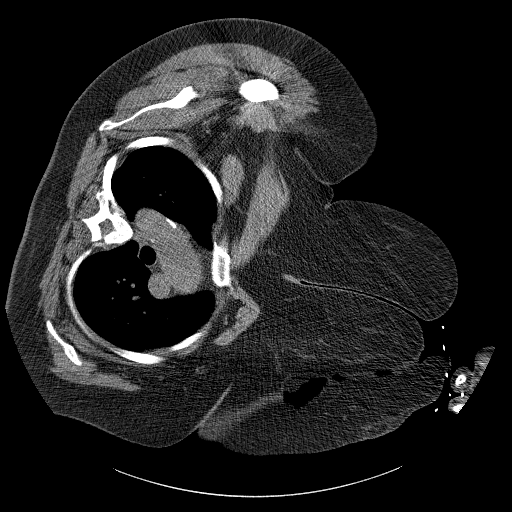
[im 12/35  lung]
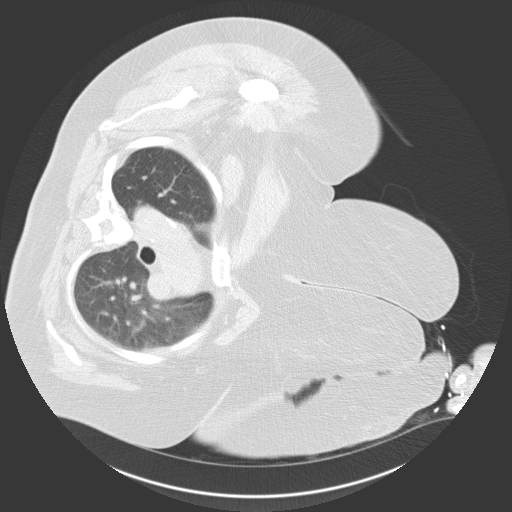
[im 12/35  bone]
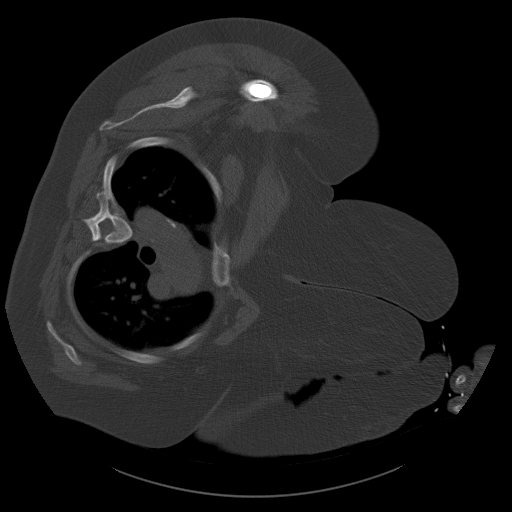
[im 23/35  soft-tissue]
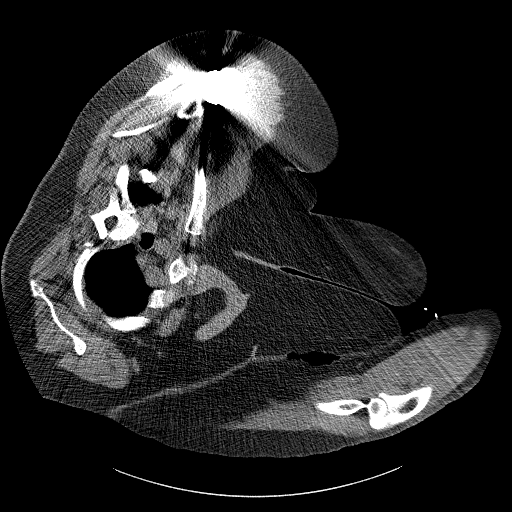
[im 23/35  lung]
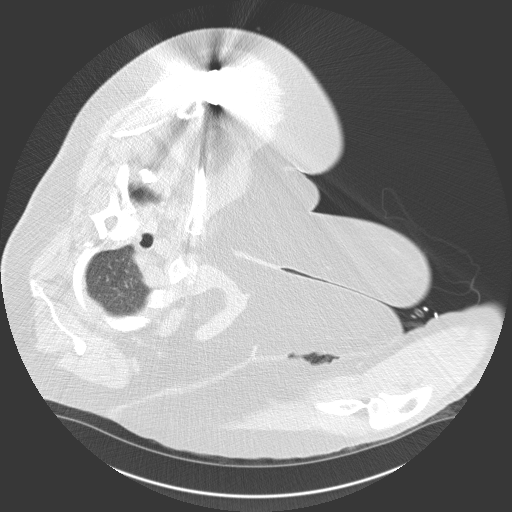

[Series 3: i-spiral 5.0 b40f · axial · 0.98mm/px · z∈[+1255,+1300]mm · 2 of 39 slices shown (2 of 3)]
[im 13/39  soft-tissue]
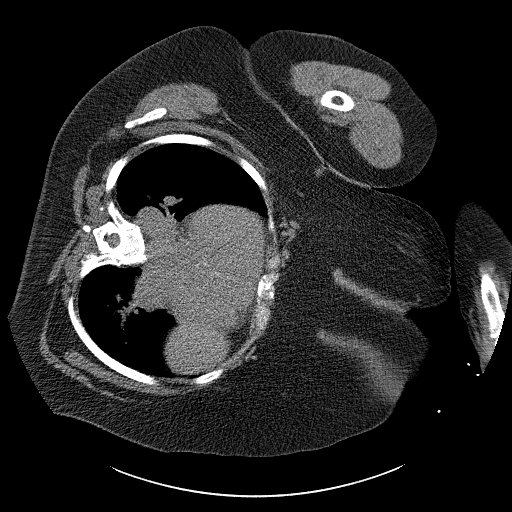
[im 26/39  soft-tissue]
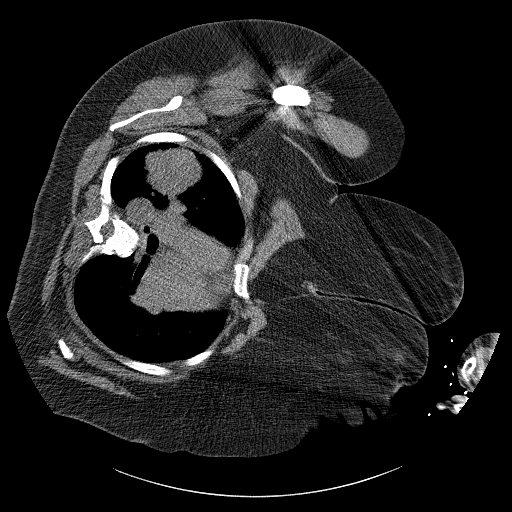

[Series 4: i-spiral 5.0 b40f · axial · 0.98mm/px · z∈[+1255,+1300]mm · 2 of 39 slices shown (3 of 3)]
[im 13/39  soft-tissue]
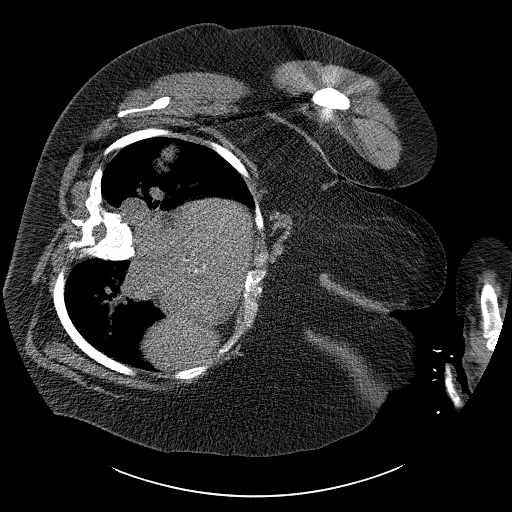
[im 26/39  soft-tissue]
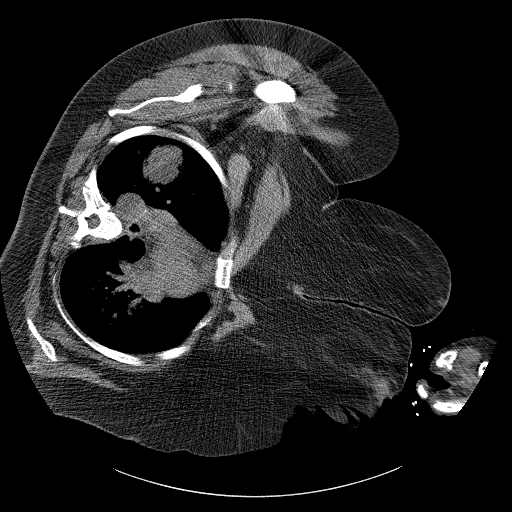

[6 of 32 positions shown; findings below may reference images not displayed]

MEDICATIONS:
None.

ANESTHESIA/SEDATION:
Fentanyl 50 mcg IV; Versed 1 mg IV

Sedation time: 16 minutes; The patient was continuously monitored
during the procedure by the interventional radiology nurse under my
direct supervision.

CONTRAST:  None

COMPLICATIONS:
None immediate.

PROCEDURE:
Informed consent was obtained from the patient following an
explanation of the procedure, risks, benefits and alternatives. The
patient understands,agrees and consents for the procedure. All
questions were addressed. A time out was performed prior to the
initiation of the procedure.

The patient was positioned right lateral decubitus on the CT table
and a limited chest CT was performed for procedural planning
demonstrating unchanged size and appearance of the large left upper
lobe pulmonary mass with dominant component measuring approximately
6.6 x 4.9 cm (image 17, series 3). The operative site was prepped
and draped in the usual sterile fashion. Under sterile conditions
and local anesthesia, a 17 gauge coaxial needle was advanced into
the peripheral aspect of the nodule. Positioning was confirmed with
intermittent CT fluoroscopy and followed by the acquisition of 4
core needle biopsy samples with an 18 gauge core needle biopsy
device. Note, while the samples was placed in saline and set aside
for potential aerobic and anaerobic culture. The coaxial needle was
removed and superficial hemostasis was achieved with manual
compression.

Limited post procedural chest CT was negative for pneumothorax or
additional complication. A dressing was placed. The patient
tolerated the procedure well without immediate postprocedural
complication. The patient was escorted to have an upright chest
radiograph.
IMPRESSION: Technically successful CT guided core needle core biopsy of
indeterminate left upper lobe pulmonary mass.

## 2018-11-19 IMAGING — DX DG CHEST 1V PORT
1 series · 1 of 1 positions shown · non-contrast
Comparison: Chest CT [DATE] and images obtained during
CT-guided biopsy earlier today. Radiographs [DATE].

CLINICAL DATA: Post left upper lobe lung biopsy.

EXAM:
PORTABLE CHEST 1 VIEW

[chest]
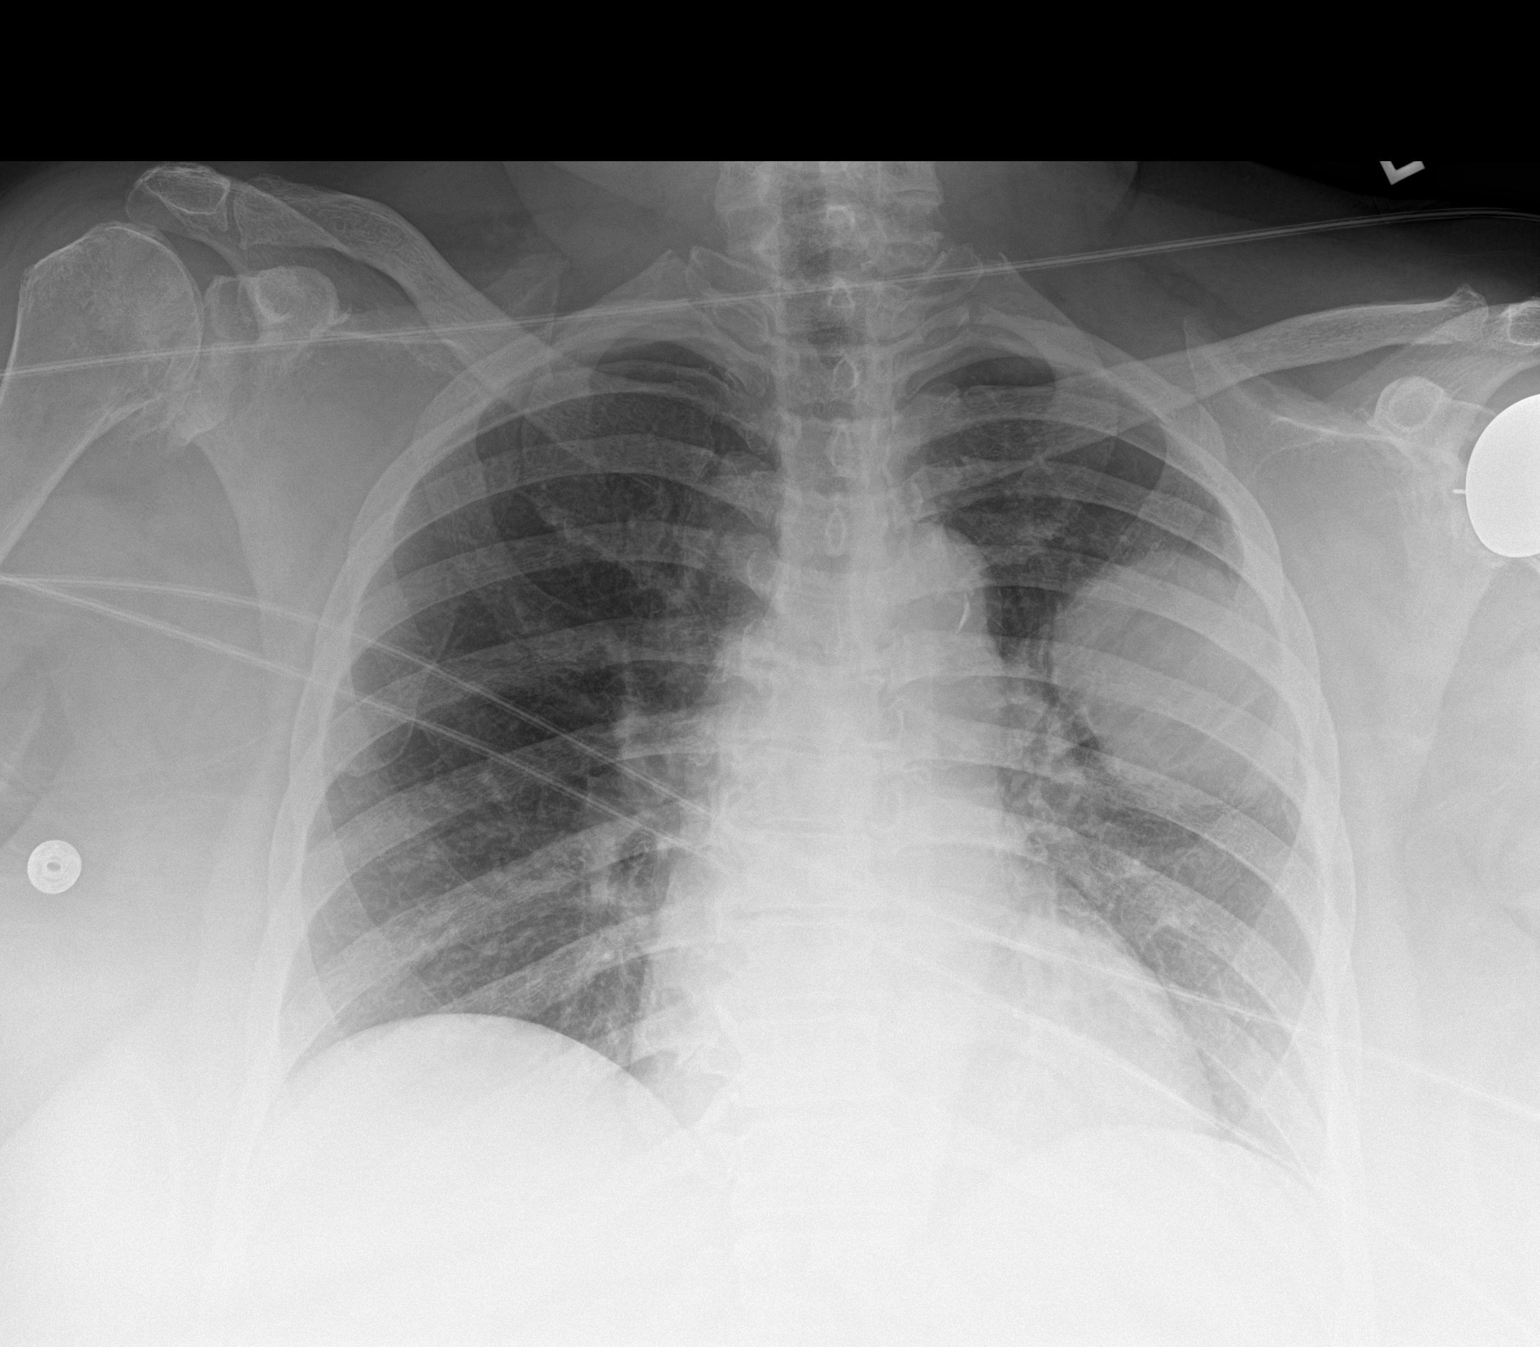

[1 of 1 positions shown; findings below may reference images not displayed]

FINDINGS: [YT] hours. Large left upper lobe mass appears unchanged from the
recent CT. There is no significant surrounding hemorrhage. The heart
size and mediastinal contours are stable with mild fullness of the
AP window and mild aortic atherosclerosis. There is no pleural
effusion or pneumothorax. The right lung is clear. Patient is status
post left shoulder arthroplasty.
IMPRESSION: No demonstrated complication following left upper lobe biopsy.

## 2018-11-19 MED ORDER — LIDOCAINE-EPINEPHRINE 1 %-1:100000 IJ SOLN
INTRAMUSCULAR | Status: AC
  Filled 2018-11-19: qty 1

## 2018-11-19 MED ORDER — MIDAZOLAM HCL 2 MG/2ML IJ SOLN
INTRAMUSCULAR | Status: DC | PRN
  Administered 2018-11-19: 1 mg via INTRAVENOUS

## 2018-11-19 MED ORDER — MIDAZOLAM HCL 2 MG/2ML IJ SOLN
INTRAMUSCULAR | Status: AC
  Filled 2018-11-19: qty 2

## 2018-11-19 MED ORDER — FENTANYL CITRATE (PF) 100 MCG/2ML IJ SOLN
INTRAMUSCULAR | Status: DC | PRN
  Administered 2018-11-19: 50 ug via INTRAVENOUS

## 2018-11-19 MED ORDER — FENTANYL CITRATE (PF) 100 MCG/2ML IJ SOLN
INTRAMUSCULAR | Status: AC
  Filled 2018-11-19: qty 2

## 2018-11-19 MED ORDER — SODIUM CHLORIDE 0.9 % IV SOLN: INTRAVENOUS | Status: DC

## 2018-11-19 NOTE — Procedures (Signed)
Pre procedural Dx: Lung mass Post procedural Dx: Same  Technically successful CT guided biopsy of left upper lobe lung mass   EBL: None.  Complications: None immediate.   Ronny Bacon, MD Pager #: [phone]

## 2018-11-19 NOTE — Discharge Instructions (Signed)
Lung Biopsy, Care After This sheet gives you information about how to care for yourself after your procedure. Your health care provider may also give you more specific instructions depending on the type of biopsy you had. If you have problems or questions, contact your health care provider. What can I expect after the procedure? After the procedure, it is common to have:  A cough.  A sore throat.  Pain where a needle, bronchoscope, or incision was used to collect a biopsy sample (biopsy site). Follow these instructions at home: Medicines  Take over-the-counter and prescription medicines only as told by your health care provider.  Do not drink alcohol if your health care provider tells you not to drink.  Do not drive for 24 hours if you were given a sedative. Biopsy site care   Follow instructions from your health care provider about how to take care of your biopsy site. Make sure you: ? Wash your hands with soap and water before and after you change your bandage (dressing). If soap and water are not available, use hand sanitizer. ? Remove your dressing as told by your health care provider. In 24 hours  Do not take baths, swim, or use a hot tub until your health care provider approves. Ask your health care provider if you may take showers. You may only be allowed to take sponge baths. You may shower in 24 hours.  Check your biopsy site every day for signs of infection. Check for: ? Redness, swelling, or more pain. ? Fluid or blood. ? Warmth. ? Pus or a bad smell. General instructions  Return to your normal activities as told by your health care provider. Ask your health care provider what activities are safe for you.  It is up to you to get the results of your procedure. Ask your health care provider, or the department that is doing the procedure, when your results will be ready.  Keep all follow-up visits as told by your health care provider. This is important. Contact a health  care provider if:  You have a fever.  You have redness, swelling, or more pain around your biopsy site.  You have fluid or blood coming from your biopsy site.  Your biopsy site feels warm to the touch.  You have pus or a bad smell coming from your biopsy site.  You have pain that does not get better with medicine. Get help right away if:  You cough up blood.  You have trouble breathing.  You have chest pain.  You lose consciousness. Summary  After the procedure, it is common to have a sore throat and a cough.  Return to your normal activities as told by your health care provider. Ask your health care provider what activities are safe for you.  Take over-the-counter and prescription medicines only as told by your health care provider.  Report any unusual symptoms to your health care provider. This information is not intended to replace advice given to you by your health care provider. Make sure you discuss any questions you have with your health care provider. Document Released: 04/04/2016 Document Revised: 04/10/2018 Document Reviewed: 04/04/2016 Elsevier Patient Education  [place].  Moderate Conscious Sedation, Adult, Care After These instructions provide you with information about caring for yourself after your procedure. Your health care provider may also give you more specific instructions. Your treatment has been planned according to current medical practices, but problems sometimes occur. Call your health care provider if you have any  problems or questions after your procedure. What can I expect after the procedure? After your procedure, it is common:  To feel sleepy for several hours.  To feel clumsy and have poor balance for several hours.  To have poor judgment for several hours.  To vomit if you eat too soon. Follow these instructions at home: For at least 24 hours after the procedure:   Do not: ? Participate in activities where you could fall  or become injured. ? Drive. ? Use heavy machinery. ? Drink alcohol. ? Take sleeping pills or medicines that cause drowsiness. ? Make important decisions or sign legal documents. ? Take care of children on your own.  Rest. Eating and drinking  Follow the diet recommended by your health care provider.  If you vomit: ? Drink water, juice, or soup when you can drink without vomiting. ? Make sure you have little or no nausea before eating solid foods. General instructions  Have a responsible adult stay with you until you are awake and alert.  Take over-the-counter and prescription medicines only as told by your health care provider.  If you smoke, do not smoke without supervision.  Keep all follow-up visits as told by your health care provider. This is important. Contact a health care provider if:  You keep feeling nauseous or you keep vomiting.  You feel light-headed.  You develop a rash.  You have a fever. Get help right away if:  You have trouble breathing. This information is not intended to replace advice given to you by your health care provider. Make sure you discuss any questions you have with your health care provider. Document Released: 12/25/2012 Document Revised: 02/16/2017 Document Reviewed: 06/26/2015 Elsevier Patient Education  2020 [employer].

## 2018-11-19 NOTE — H&P (Signed)
Chief Complaint: Patient was seen in consultation today for left lung mass biopsy at the request of Hawkins,Edward  Referring Physician(s): Hawkins,Edward  Supervising Physician: Sandi Mariscal  Patient Status: [facility] - Out-pt  History of Present Illness: Denise [name] is a 68 y.o. female   Hx minimal hemoptysis for few weeks Resolved Previous smoker  CT 10/17/18: : IMPRESSION: There is a large mass of the left upper lobe measuring 6.6 x 5.0 x 5.3 cm (series 2, image 48, series 5, image 83). There is an enlarged AP window lymph node measuring 3.0 x 1.2 cm. Findings are highly concerning for primary lung malignancy. This mass is amenable to percutaneous CT-guided biopsy and PET-CT may be used to characterize for metabolic activity of mass and lymph nodes.  Request made for biopsy of LUL mass Approved and now scheduled for today   Past Medical History:  Diagnosis Date  . Anemia   . Arthritis   . Diabetes mellitus    diet controlled  . GERD (gastroesophageal reflux disease)   . HTN (hypertension)   . HTN (hypertension)   . Shortness of breath dyspnea    with exertion    Past Surgical History:  Procedure Laterality Date  . CHOLECYSTECTOMY  1997  . COLONOSCOPY    . GASTRIC BYPASS    . INCISIONAL HERNIA REPAIR  04/11/11  . LAPAROSCOPIC SALPINGOOPHERECTOMY    . LAPAROTOMY  04/11/2011   Procedure: EXPLORATORY LAPAROTOMY;  Surgeon: Joyice Faster. Cornett, MD;  Location: [facility] ORS;  Service: General;  Laterality: N/A;  closure port hole  . TOTAL HIP ARTHROPLASTY  03/07/2012   Procedure: TOTAL HIP ARTHROPLASTY ANTERIOR APPROACH;  Surgeon: Mauri Pole, MD;  Location: [facility];  Service: Orthopedics;  Laterality: Right;  . TOTAL SHOULDER ARTHROPLASTY Left 01/21/2015  . TOTAL SHOULDER ARTHROPLASTY Left 01/21/2015   Procedure: LEFT TOTAL SHOULDER ARTHROPLASTY;  Surgeon: Justice Britain, MD;  Location: [place];  Service: Orthopedics;  Laterality: Left;  Marland Kitchen VAGINAL HYSTERECTOMY       Allergies: Patient has no known allergies.  Medications: Prior to Admission medications   Medication Sig Start Date End Date Taking? Authorizing Provider  amLODipine (NORVASC) 5 MG tablet Take 5 mg by mouth every morning.    Yes [provider]  aspirin EC 81 MG tablet Take 81 mg by mouth daily.   Yes [provider]  Calcium Carbonate Antacid (TUMS PO) Take 2 tablets by mouth 2 (two) times daily.    Yes [provider]  Cyanocobalamin (VITAMIN B-12) 2500 MCG SUBL Place 2,500 mcg under the tongue every morning.    Yes [provider]  ferrous sulfate 325 (65 FE) MG tablet Take 1 tablet (325 mg total) by mouth 3 (three) times daily after meals. Patient taking differently: Take 325 mg by mouth daily.  03/08/12  Yes Babish, Rodman Key, PA-C  gabapentin (NEURONTIN) 300 MG capsule Take 300 mg by mouth at bedtime as needed for pain. 08/07/18  Yes [provider]  ibuprofen (ADVIL,MOTRIN) 800 MG tablet Take 1 tablet (800 mg total) by mouth 3 (three) times daily. 01/22/15  Yes Shuford, Olivia Mackie, PA-C  Multiple Vitamin (MULITIVITAMIN WITH MINERALS) TABS Take 1 tablet by mouth every morning.    Yes [provider]  omeprazole (PRILOSEC) 20 MG capsule Take 20 mg by mouth daily. 09/28/18  Yes [provider]     Family History  Problem Relation Age of Onset  . Arthritis Unknown   . Cancer Unknown   . Breast cancer Mother   .  COPD Mother   . Arthritis Mother   . Diabetes Mother   . Hypertension Mother   . Hypertension Father   . Diabetes Father     Social History   Socioeconomic History  . Marital status: Single    Spouse name: Not on file  . Number of children: Not on file  . Years of education: 12th grade  . Highest education level: Not on file  Occupational History  . Occupation: unemployed    Employer: [employer]  . Financial resource strain: Not on file  . Food insecurity    Worry: Not on file     Inability: Not on file  . Transportation needs    Medical: Not on file    Non-medical: Not on file  Tobacco Use  . Smoking status: Former Smoker    Packs/day: 1.00    Years: 12.00    Pack years: 12.00    Types: Cigarettes    Quit date: 03/20/1976    Years since quitting: 42.6  . Smokeless tobacco: Never Used  Substance and Sexual Activity  . Alcohol use: No  . Drug use: No  . Sexual activity: Never  Lifestyle  . Physical activity    Days per week: Not on file    Minutes per session: Not on file  . Stress: Not on file  Relationships  . Social Herbalist on phone: Not on file    Gets together: Not on file    Attends religious service: Not on file    Active member of club or organization: Not on file    Attends meetings of clubs or organizations: Not on file    Relationship status: Not on file  Other Topics Concern  . Not on file  Social History Narrative  . Not on file    Review of Systems: A 12 point ROS discussed and pertinent positives are indicated in the HPI above.  All other systems are negative.  Review of Systems  Constitutional: Negative for activity change, fatigue and fever.  Respiratory: Negative for cough and shortness of breath.   Cardiovascular: Negative for chest pain.  Gastrointestinal: Negative for abdominal pain.  Psychiatric/Behavioral: Negative for behavioral problems and confusion.    Vital Signs: BP 135/80   Pulse 85   Temp 98.2 F (36.8 C) (Oral)   Resp 16   Ht 5\' 2"  (1.575 m)   Wt 270 lb (122.5 kg)   SpO2 100%   BMI 49.38 kg/m   Physical Exam Vitals signs reviewed.  Cardiovascular:     Rate and Rhythm: Normal rate and regular rhythm.     Heart sounds: Normal heart sounds.  Pulmonary:     Effort: Pulmonary effort is normal.     Breath sounds: Normal breath sounds.  Abdominal:     Tenderness: There is no abdominal tenderness.  Musculoskeletal: Normal range of motion.  Skin:    General: Skin is warm and dry.   Neurological:     Mental Status: She is alert and oriented to person, place, and time.  Psychiatric:        Mood and Affect: Mood normal.        Behavior: Behavior normal.        Thought Content: Thought content normal.        Judgment: Judgment normal.     Imaging: No results found.  Labs:  CBC: No results for input(s): WBC, HGB, HCT, PLT in the last 8760 hours.  COAGS: No results for input(s): INR, APTT in the last 8760 hours.  BMP: No results for input(s): NA, K, CL, CO2, GLUCOSE, BUN, CALCIUM, CREATININE, GFRNONAA, GFRAA in the last 8760 hours.  Invalid input(s): CMP  LIVER FUNCTION TESTS: No results for input(s): BILITOT, AST, ALT, ALKPHOS, PROT, ALBUMIN in the last 8760 hours.  TUMOR MARKERS: No results for input(s): AFPTM, CEA, CA199, CHROMGRNA in the last 8760 hours.  Assessment and Plan:  LUL mass Scheduled for biopsy of same Risks and benefits of CT guided lung nodule biopsy was discussed with the patient including, but not limited to bleeding, hemoptysis, respiratory failure requiring intubation, infection, pneumothorax requiring chest tube placement, stroke from air embolism or even death.  All of the patient's questions were answered and the patient is agreeable to proceed. Consent signed and in chart.   Thank you for this interesting consult.  I greatly enjoyed meeting Denise [name] and look forward to participating in their care.  A copy of this report was sent to the requesting provider on this date.  Electronically Signed: Lavonia Drafts, PA-C 11/19/2018, 9:30 AM   I spent a total of  30 Minutes   in face to face in clinical consultation, greater than 50% of which was counseling/coordinating care for LUL mass bx

## 2018-11-28 ENCOUNTER — Other Ambulatory Visit: Payer: Self-pay | Admitting: *Deleted

## 2018-11-28 NOTE — Progress Notes (Signed)
The proposed treatment discussed in cancer conference 9/10 is for discussion purpose only and is not a binding recommendation.  The patient was not physically examined nor present for their treatment options.  Therefore, final treatment plans cannot be decided.   Will update Nurse Navigator at [facility] on recommendations.

## 2018-12-06 ENCOUNTER — Encounter (HOSPITAL_COMMUNITY): Payer: Self-pay

## 2018-12-09 ENCOUNTER — Other Ambulatory Visit: Payer: Self-pay

## 2018-12-09 ENCOUNTER — Inpatient Hospital Stay (HOSPITAL_COMMUNITY): Payer: Medicare Other

## 2018-12-09 ENCOUNTER — Inpatient Hospital Stay (HOSPITAL_COMMUNITY): Payer: Medicare Other | Attending: Hematology | Admitting: Hematology

## 2018-12-09 ENCOUNTER — Encounter (HOSPITAL_COMMUNITY): Payer: Self-pay | Admitting: Hematology

## 2018-12-09 DIAGNOSIS — R6884 Jaw pain: Secondary | ICD-10-CM | POA: Diagnosis not present

## 2018-12-09 DIAGNOSIS — Z7982 Long term (current) use of aspirin: Secondary | ICD-10-CM | POA: Insufficient documentation

## 2018-12-09 DIAGNOSIS — Z79899 Other long term (current) drug therapy: Secondary | ICD-10-CM | POA: Insufficient documentation

## 2018-12-09 DIAGNOSIS — C3492 Malignant neoplasm of unspecified part of left bronchus or lung: Secondary | ICD-10-CM

## 2018-12-09 DIAGNOSIS — Z833 Family history of diabetes mellitus: Secondary | ICD-10-CM | POA: Diagnosis not present

## 2018-12-09 DIAGNOSIS — Z124 Encounter for screening for malignant neoplasm of cervix: Secondary | ICD-10-CM | POA: Diagnosis not present

## 2018-12-09 DIAGNOSIS — Z87891 Personal history of nicotine dependence: Secondary | ICD-10-CM | POA: Diagnosis not present

## 2018-12-09 DIAGNOSIS — Z803 Family history of malignant neoplasm of breast: Secondary | ICD-10-CM | POA: Insufficient documentation

## 2018-12-09 DIAGNOSIS — I10 Essential (primary) hypertension: Secondary | ICD-10-CM | POA: Insufficient documentation

## 2018-12-09 DIAGNOSIS — E119 Type 2 diabetes mellitus without complications: Secondary | ICD-10-CM | POA: Insufficient documentation

## 2018-12-09 DIAGNOSIS — Z8249 Family history of ischemic heart disease and other diseases of the circulatory system: Secondary | ICD-10-CM | POA: Insufficient documentation

## 2018-12-09 DIAGNOSIS — C3412 Malignant neoplasm of upper lobe, left bronchus or lung: Secondary | ICD-10-CM | POA: Diagnosis not present

## 2018-12-09 LAB — CBC WITH DIFFERENTIAL/PLATELET
Abs Immature Granulocytes: 0.02 10*3/uL (ref 0.00–0.07)
Basophils Absolute: 0 10*3/uL (ref 0.0–0.1)
Basophils Relative: 1 %
Eosinophils Absolute: 0.1 10*3/uL (ref 0.0–0.5)
Eosinophils Relative: 2 %
HCT: 39.6 % (ref 36.0–46.0)
Hemoglobin: 11.7 g/dL — ABNORMAL LOW (ref 12.0–15.0)
Immature Granulocytes: 0 %
Lymphocytes Relative: 23 %
Lymphs Abs: 1.9 10*3/uL (ref 0.7–4.0)
MCH: 24.9 pg — ABNORMAL LOW (ref 26.0–34.0)
MCHC: 29.5 g/dL — ABNORMAL LOW (ref 30.0–36.0)
MCV: 84.4 fL (ref 80.0–100.0)
Monocytes Absolute: 0.6 10*3/uL (ref 0.1–1.0)
Monocytes Relative: 8 %
Neutro Abs: 5.4 10*3/uL (ref 1.7–7.7)
Neutrophils Relative %: 66 %
Platelets: 493 10*3/uL — ABNORMAL HIGH (ref 150–400)
RBC: 4.69 MIL/uL (ref 3.87–5.11)
RDW: 15.8 % — ABNORMAL HIGH (ref 11.5–15.5)
WBC: 8.1 10*3/uL (ref 4.0–10.5)
nRBC: 0 % (ref 0.0–0.2)

## 2018-12-09 LAB — COMPREHENSIVE METABOLIC PANEL
ALT: 16 U/L (ref 0–44)
AST: 22 U/L (ref 15–41)
Albumin: 3.6 g/dL (ref 3.5–5.0)
Alkaline Phosphatase: 121 U/L (ref 38–126)
Anion gap: 9 (ref 5–15)
BUN: 11 mg/dL (ref 8–23)
CO2: 28 mmol/L (ref 22–32)
Calcium: 9.2 mg/dL (ref 8.9–10.3)
Chloride: 106 mmol/L (ref 98–111)
Creatinine, Ser: 0.64 mg/dL (ref 0.44–1.00)
GFR calc Af Amer: >60 mL/min (ref >60)
GFR calc non Af Amer: >60 mL/min (ref >60)
Glucose, Bld: 118 mg/dL — ABNORMAL HIGH (ref 70–99)
Potassium: 4.1 mmol/L (ref 3.5–5.1)
Sodium: 143 mmol/L (ref 135–145)
Total Bilirubin: <0.1 mg/dL — ABNORMAL LOW (ref 0.3–1.2)
Total Protein: 7.3 g/dL (ref 6.5–8.1)

## 2018-12-09 NOTE — Progress Notes (Deleted)
[facility] NOTE  Patient Care Team: Iona Beard, MD as PCP - General (Family Medicine)  CHIEF COMPLAINTS/PURPOSE OF CONSULTATION:  Left lung adenocarcinoma.  HISTORY OF PRESENTING ILLNESS:  Denise [name] 68 y.o. female is seen in consultation today at the request of Dr. Luan Pulling for further work-up and management of left lung adenocarcinoma.  She had episodes of hemoptysis x2, and was seen by Dr. Walker Kehr who ordered a CT scan of the chest without contrast.  This was done on 730 03/21/2018 which showed large mass in the left upper lobe measuring 6.6 x 5.0 x 5.3 cm.  No pleural effusion or pneumothorax.  There is an enlarged AP window lymph node measuring 3.0 x 1.2 cm.  She was then evaluated by Dr. Luan Pulling who has recommended a CT biopsy.  This was done on 11/19/2018 which showed adenocarcinoma of the left upper lobe.  Quit smoking 25 years ago.  She smoked 1 pack/day for 15 years.  She works as a Social research officer, government at [employer].  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  Family history significant for mother with breast cancer, sister with breast cancer, brother with head and neck cancer.  She is in fairly good general health.  Denies any tingling or numbness in extremities.  She reported left-sided job pain in the last 2 to 3 days.  She wears dentures.  Denies any headaches or vision changes.  Denies any fevers, night sweats or weight loss in the last 6 months.  Her diabetes has resolved after she underwent gastric bypass surgery.  She apparently did not lose a lot of weight after bypass surgery.  Appetite and energy levels are 100%.  Left jaw pain is rated as 2 out of 10.  Occasional leg swelling present.  MEDICAL HISTORY:  Past Medical History:  Diagnosis Date  . Anemia   . Arthritis   . Diabetes mellitus    diet controlled  . GERD (gastroesophageal reflux disease)   . HTN (hypertension)   . HTN (hypertension)   . Shortness of breath  dyspnea    with exertion    SURGICAL HISTORY: Past Surgical History:  Procedure Laterality Date  . CHOLECYSTECTOMY  1997  . COLONOSCOPY    . GASTRIC BYPASS    . INCISIONAL HERNIA REPAIR  04/11/11  . LAPAROSCOPIC SALPINGOOPHERECTOMY    . LAPAROTOMY  04/11/2011   Procedure: EXPLORATORY LAPAROTOMY;  Surgeon: Joyice Faster. Cornett, MD;  Location: [facility];  Service: General;  Laterality: N/A;  closure port hole  . TOTAL HIP ARTHROPLASTY  03/07/2012   Procedure: TOTAL HIP ARTHROPLASTY ANTERIOR APPROACH;  Surgeon: Mauri Pole, MD;  Location: [facility];  Service: Orthopedics;  Laterality: Right;  . TOTAL SHOULDER ARTHROPLASTY Left 01/21/2015  . TOTAL SHOULDER ARTHROPLASTY Left 01/21/2015   Procedure: LEFT TOTAL SHOULDER ARTHROPLASTY;  Surgeon: Justice Britain, MD;  Location: [facility];  Service: Orthopedics;  Laterality: Left;  Marland Kitchen VAGINAL HYSTERECTOMY      SOCIAL HISTORY: Social History   Socioeconomic History  . Marital status: Single    Spouse name: Not on file  . Number of children: Not on file  . Years of education: 12th grade  . Highest education level: Not on file  Occupational History  . Occupation: Employed    Employer: [employer]  . Financial resource strain: Not very hard  . Food insecurity    Worry: Never true    Inability: Never true  .  Transportation needs    Medical: No    Non-medical: No  Tobacco Use  . Smoking status: Former Smoker    Packs/day: 1.00    Years: 12.00    Pack years: 12.00    Types: Cigarettes    Quit date: 03/20/1976    Years since quitting: 42.7  . Smokeless tobacco: Never Used  Substance and Sexual Activity  . Alcohol use: No  . Drug use: No  . Sexual activity: Never  Lifestyle  . Physical activity    Days per week: 0 days    Minutes per session: 0 min  . Stress: Only a little  Relationships  . Social connections    Talks on phone: More than three times a week    Gets together: Once a week    Attends religious service:  More than 4 times per year    Active member of club or organization: No    Attends meetings of clubs or organizations: Never    Relationship status: Never married  . Intimate partner violence    Fear of current or ex partner: No    Emotionally abused: No    Physically abused: No    Forced sexual activity: No  Other Topics Concern  . Not on file  Social History Narrative  . Not on file    FAMILY HISTORY: Family History  Problem Relation Age of Onset  . Breast cancer Mother   . COPD Mother   . Arthritis Mother   . Diabetes Mother   . Hypertension Mother   . Hypertension Father   . Diabetes Father   . Breast cancer Sister   . Thyroid cancer Brother   . Huntington's disease Maternal Grandmother   . Heart attack Brother     ALLERGIES:  has No Known Allergies.  MEDICATIONS:  Current Outpatient Medications  Medication Sig Dispense Refill  . amLODipine (NORVASC) 5 MG tablet Take 5 mg by mouth every morning.     Marland Kitchen aspirin EC 81 MG tablet Take 81 mg by mouth daily.    . Calcium Carbonate Antacid (TUMS PO) Take 2 tablets by mouth 2 (two) times daily.     . Cyanocobalamin (VITAMIN B-12) 2500 MCG SUBL Place 2,500 mcg under the tongue every morning.     . ferrous sulfate 325 (65 FE) MG tablet Take 1 tablet (325 mg total) by mouth 3 (three) times daily after meals. (Patient taking differently: Take 325 mg by mouth daily. )    . Multiple Vitamin (MULITIVITAMIN WITH MINERALS) TABS Take 1 tablet by mouth every morning.     Marland Kitchen omeprazole (PRILOSEC) 20 MG capsule Take 20 mg by mouth daily.    Marland Kitchen gabapentin (NEURONTIN) 300 MG capsule Take 300 mg by mouth at bedtime as needed for pain.    Marland Kitchen ibuprofen (ADVIL,MOTRIN) 800 MG tablet Take 1 tablet (800 mg total) by mouth 3 (three) times daily. (Patient not taking: Reported on 12/06/2018) 90 tablet 2   No current facility-administered medications for this visit.     REVIEW OF SYSTEMS:   Constitutional: Denies fevers, chills or abnormal night  sweats Eyes: Denies blurriness of vision, double vision or watery eyes Ears, nose, mouth, throat, and face: Denies mucositis or sore throat Respiratory: Positive for dyspnea on exertion and hemoptysis. Cardiovascular: Denies palpitation, chest discomfort.  Positive for leg swelling. Gastrointestinal:  Denies nausea, heartburn or change in bowel habits Skin: Denies abnormal skin rashes Lymphatics: Denies new lymphadenopathy or easy bruising Neurological:Denies numbness, tingling or new  weaknesses Behavioral/Psych: Mood is stable, no new changes  All other systems were reviewed with the patient and are negative.  PHYSICAL EXAMINATION: ECOG PERFORMANCE STATUS: 0 - Asymptomatic  Vitals:   12/09/18 1328  BP: 138/76  Pulse: 96  Resp: 20  Temp: 97.9 F (36.6 C)  SpO2: 100%   Filed Weights   12/09/18 1328  Weight: 276 lb 12.8 oz (125.6 kg)    GENERAL:alert, no distress and comfortable SKIN: skin color, texture, turgor are normal, no rashes or significant lesions EYES: normal, conjunctiva are pink and non-injected, sclera clear OROPHARYNX:no exudate, no erythema and lips, buccal mucosa, and tongue normal  NECK: supple, thyroid normal size, non-tender, without nodularity LYMPH:  no palpable lymphadenopathy in the cervical, axillary or inguinal LUNGS: clear to auscultation and percussion with normal breathing effort HEART: regular rate & rhythm and no murmurs and no lower extremity edema ABDOMEN:abdomen soft, non-tender and normal bowel sounds Musculoskeletal:no cyanosis of digits and no clubbing  PSYCH: alert & oriented x 3 with fluent speech NEURO: no focal motor/sensory deficits  LABORATORY DATA:  I have reviewed the data as listed Lab Results  Component Value Date   WBC 7.9 11/19/2018   HGB 11.2 (L) 11/19/2018   HCT 36.9 11/19/2018   MCV 83.3 11/19/2018   PLT 442 (H) 11/19/2018     Chemistry      Component Value Date/Time   NA 143 01/14/2015 0958   K 3.6 01/14/2015  0958   CL 107 01/14/2015 0958   CO2 28 01/14/2015 0958   BUN 9 01/14/2015 0958   CREATININE 0.72 01/14/2015 0958      Component Value Date/Time   CALCIUM 9.2 01/14/2015 0958   ALKPHOS 95 01/14/2015 0958   AST 16 01/14/2015 0958   ALT 17 01/14/2015 0958   BILITOT 0.3 01/14/2015 0958       RADIOGRAPHIC STUDIES: I have personally reviewed the radiological images as listed and agreed with the findings in the report. Ct Biopsy  Result Date: 11/19/2018 INDICATION: No known primary, now with indeterminate left upper lobe pulmonary mass. Please perform CT-guided biopsy for tissue diagnostic purposes. EXAM: CT GUIDED LEFT UPPER LOBE MASS BIOPSY COMPARISON:  Chest CT-09/17/2018 MEDICATIONS: None. ANESTHESIA/SEDATION: Fentanyl 50 mcg IV; Versed 1 mg IV Sedation time: 16 minutes; The patient was continuously monitored during the procedure by the interventional radiology nurse under my direct supervision. CONTRAST:  None COMPLICATIONS: None immediate. PROCEDURE: Informed consent was obtained from the patient following an explanation of the procedure, risks, benefits and alternatives. The patient understands,agrees and consents for the procedure. All questions were addressed. A time out was performed prior to the initiation of the procedure. The patient was positioned right lateral decubitus on the CT table and a limited chest CT was performed for procedural planning demonstrating unchanged size and appearance of the large left upper lobe pulmonary mass with dominant component measuring approximately 6.6 x 4.9 cm (image 17, series 3). The operative site was prepped and draped in the usual sterile fashion. Under sterile conditions and local anesthesia, a 17 gauge coaxial needle was advanced into the peripheral aspect of the nodule. Positioning was confirmed with intermittent CT fluoroscopy and followed by the acquisition of 4 core needle biopsy samples with an 18 gauge core needle biopsy device. Note, while the  samples was placed in saline and set aside for potential aerobic and anaerobic culture. The coaxial needle was removed and superficial hemostasis was achieved with manual compression. Limited post procedural chest CT was negative for  pneumothorax or additional complication. A dressing was placed. The patient tolerated the procedure well without immediate postprocedural complication. The patient was escorted to have an upright chest radiograph. IMPRESSION: Technically successful CT guided core needle core biopsy of indeterminate left upper lobe pulmonary mass. Electronically Signed   By: Sandi Mariscal M.D.   On: 11/19/2018 14:32   Dg Chest Port 1 View  Result Date: 11/19/2018 CLINICAL DATA:  Post left upper lobe lung biopsy. EXAM: PORTABLE CHEST 1 VIEW COMPARISON:  Chest CT 10/17/2018 and images obtained during CT-guided biopsy earlier today. Radiographs 03/31/2011. FINDINGS: 1352 hours. Large left upper lobe mass appears unchanged from the recent CT. There is no significant surrounding hemorrhage. The heart size and mediastinal contours are stable with mild fullness of the AP window and mild aortic atherosclerosis. There is no pleural effusion or pneumothorax. The right lung is clear. Patient is status post left shoulder arthroplasty. IMPRESSION: No demonstrated complication following left upper lobe biopsy. Electronically Signed   By: Richardean Sale M.D.   On: 11/19/2018 14:15    ASSESSMENT & PLAN:  Adenocarcinoma of left lung ([place]) 1.  Clinical stage IIIb (T3N2) adenocarcinoma of the left lung: - Presentation with hemoptysis, CT of the chest without contrast on 10/17/2018 showed 6.6 x 5 x 5.3 cm left upper lobe mass with enlarged AP window lymph node measuring 3 x 1.2 cm. - She was evaluated by Dr. Luan Pulling, a CT-guided biopsy on 11/19/2018 consistent with adenocarcinoma. - Patient smoked 1 pack/day for 15 years, quit 25 years ago.  Last episode of hemoptysis 2 weeks ago. - Denies any headaches or vision  changes.  Noticed left side of the jaw hurting for the last 2 to 3 days. -She has been on aspirin 81 mg for many years.  I have told her to discontinue it. -I have reviewed the images with the patient.  I have recommended a PET CT scan and brain MRI with and without contrast for staging purposes. - If there is no evidence of metastatic disease, she will be offered chemoradiation therapy followed by consolidation with immunotherapy.  Orders Placed This Encounter  Procedures  . NM PET Image Initial (PI) Skull Base To Thigh    Standing Status:   Future    Standing Expiration Date:   12/09/2019    Order Specific Question:   ** REASON FOR EXAM (FREE TEXT)    Answer:   lung cancer    Order Specific Question:   If indicated for the ordered procedure, I authorize the administration of a radiopharmaceutical per Radiology protocol    Answer:   Yes    Order Specific Question:   Preferred imaging location?    Answer:   [facility]    Order Specific Question:   Radiology Contrast Protocol - do NOT remove file path    Answer:   \\[email or web address]  . MR Brain W Wo Contrast    Standing Status:   Future    Standing Expiration Date:   12/09/2019    Order Specific Question:   ** REASON FOR EXAM (FREE TEXT)    Answer:   lung cancer    Order Specific Question:   If indicated for the ordered procedure, I authorize the administration of contrast media per Radiology protocol    Answer:   Yes    Order Specific Question:   What is the patient's sedation requirement?    Answer:   No Sedation    Order Specific Question:   Does the patient have  a pacemaker or implanted devices?    Answer:   No    Order Specific Question:   Use SRS Protocol?    Answer:   No    Order Specific Question:   Radiology Contrast Protocol - do NOT remove file path    Answer:   [email or web address]    Order Specific Question:   Preferred imaging location?    Answer:   [facility]  (table limit-350lbs)  . CBC with Differential/Platelet    Standing Status:   Future    Standing Expiration Date:   12/09/2019  . Comprehensive metabolic panel    Standing Status:   Future    Standing Expiration Date:   12/09/2019   Total time spent 60 minutes with more than 50% of the time spent face-to-face discussing and reviewing scan results, further work-up and management, counseling and coordination of care. All questions were answered. The patient knows to call the clinic with any problems, questions or concerns.     Derek Jack, MD 12/09/2018 2:38 PM

## 2018-12-09 NOTE — Assessment & Plan Note (Signed)
1.  Clinical stage IIIb (T3N2) adenocarcinoma of the left lung: - Presentation with hemoptysis, CT of the chest without contrast on 10/17/2018 showed 6.6 x 5 x 5.3 cm left upper lobe mass with enlarged AP window lymph node measuring 3 x 1.2 cm. - She was evaluated by Dr. Luan Pulling, a CT-guided biopsy on 11/19/2018 consistent with adenocarcinoma. - Patient smoked 1 pack/day for 15 years, quit 25 years ago.  Last episode of hemoptysis 2 weeks ago. - Denies any headaches or vision changes.  Noticed left side of the jaw hurting for the last 2 to 3 days. -She has been on aspirin 81 mg for many years.  I have told her to discontinue it. -I have reviewed the images with the patient.  I have recommended a PET CT scan and brain MRI with and without contrast for staging purposes. - If there is no evidence of metastatic disease, she will be offered chemoradiation therapy followed by consolidation with immunotherapy.

## 2018-12-09 NOTE — Progress Notes (Signed)
I met with patient following the appointment with Dr. Delton Coombes.  I provided my contact information so that she can call if she gets home and has any questions or concerns.  I explained that we will not call her with the results that we will wait until her follow up and go over them at that time.  She verbalizes understanding and states that at this time she doesn't have any needs.  I encouraged her to call me should she have anything come up.  She agrees.

## 2018-12-09 NOTE — Patient Instructions (Signed)
**Note Denise-Identified via Obfuscation** Denise [name] at [facility] Discharge Instructions  You were seen today by Dr. Delton Coombes. He went over your history, family history and how you've been feeling lately. He will get blood drawn today before you leave. He will schedule you for a PET scan and a MRI of your brain to further evaluate your cancer. He will see you back after your scans for follow up.   Thank you for choosing [place] at [facility] to provide your oncology and hematology care.  To afford each patient quality time with our provider, please arrive at least 15 minutes before your scheduled appointment time.   If you have a lab appointment with the [facility] please come in thru the  Main Entrance and check in at the main information desk  You need to re-schedule your appointment should you arrive 10 or more minutes late.  We strive to give you quality time with our providers, and arriving late affects you and other patients whose appointments are after yours.  Also, if you no show three or more times for appointments you may be dismissed from the clinic at the providers discretion.     Again, thank you for choosing [facility].  Our hope is that these requests will decrease the amount of time that you wait before being seen by our physicians.       _____________________________________________________________  Should you have questions after your visit to [facility], please contact our office at [phone] between the hours of 8:00 a.m. and 4:30 p.m.  Voicemails left after 4:00 p.m. will not be returned until the following business day.  For prescription refill requests, have your pharmacy contact our office and allow 72 hours.    Cancer Center Support Programs:   > Cancer Support Group  2nd Tuesday of the month 1pm-2pm, Journey Room

## 2018-12-16 ENCOUNTER — Other Ambulatory Visit (HOSPITAL_COMMUNITY): Payer: Medicare Other

## 2018-12-16 ENCOUNTER — Ambulatory Visit (HOSPITAL_COMMUNITY): Payer: Medicare Other

## 2018-12-16 ENCOUNTER — Encounter (HOSPITAL_COMMUNITY)
Admission: RE | Admit: 2018-12-16 | Discharge: 2018-12-16 | Disposition: A | Discharge status: Home / Self Care | Payer: Medicare Other | Source: Ambulatory Visit | Attending: Hematology | Admitting: Hematology

## 2018-12-16 ENCOUNTER — Ambulatory Visit (HOSPITAL_COMMUNITY)
Admission: RE | Admit: 2018-12-16 | Discharge: 2018-12-16 | Disposition: A | Discharge status: Home / Self Care | Payer: Medicare Other | Source: Ambulatory Visit | Attending: Hematology | Admitting: Hematology

## 2018-12-16 ENCOUNTER — Other Ambulatory Visit: Payer: Self-pay

## 2018-12-16 DIAGNOSIS — C3492 Malignant neoplasm of unspecified part of left bronchus or lung: Secondary | ICD-10-CM | POA: Diagnosis not present

## 2018-12-16 DIAGNOSIS — C3490 Malignant neoplasm of unspecified part of unspecified bronchus or lung: Secondary | ICD-10-CM | POA: Diagnosis not present

## 2018-12-16 DIAGNOSIS — R531 Weakness: Secondary | ICD-10-CM | POA: Diagnosis not present

## 2018-12-16 IMAGING — PT NM PET TUM IMG INITIAL (PI) SKULL BASE T - THIGH
1 of 7 series · 2 of 25 positions shown · non-contrast
Comparison: Multiple exams, including chest CT from [DATE]

CLINICAL DATA: Initial treatment strategy for left upper lobe lung
cancer.

EXAM:
NUCLEAR MEDICINE PET SKULL BASE TO THIGH
TECHNIQUE: 16.4 mCi F-18 FDG was injected intravenously. Full-ring PET imaging
was performed from the skull base to thigh after the radiotracer. CT
data was obtained and used for attenuation correction and anatomic
localization.
Fasting blood glucose: 88 mg/dl

[Series 3: ct wb fusion · axial · 5.0mm · 0.98mm/px · z∈[-1276,-751]mm · 2 of 351 slices shown]
[im 141/351  brain]
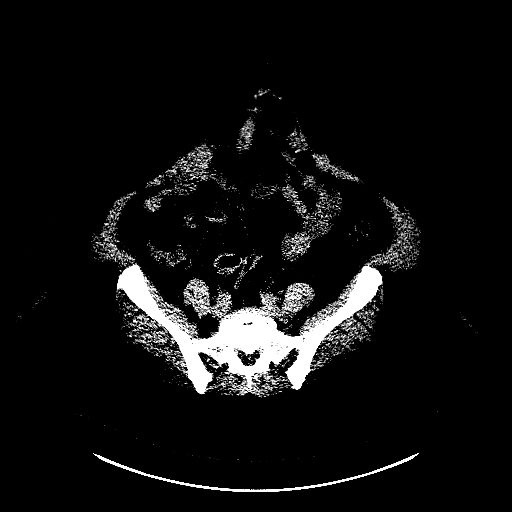
[im 351/351  brain]
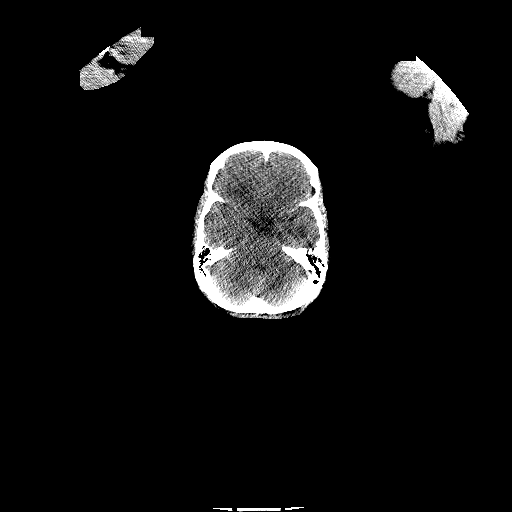

[2 of 25 positions shown; findings below may reference images not displayed]

FINDINGS: Mediastinal blood pool activity: SUV max

Liver activity: SUV max

NECK: No significant abnormal hypermetabolic activity in this
region.

Incidental CT findings: None

CHEST: [DATE] by 5.3 cm left upper lobe mass maximum SUV 14.9. This
abuts the pleural margin but does not appreciably invade the chest
wall.

AP window lymph node measuring 1.0 cm in short axis on image 83/3,
maximum SUV 3.4.

Incidental CT findings: Atherosclerotic aortic arch.

ABDOMEN/PELVIS: Focal activity along the cecum/ileocecal valve,
maximum SUV 11.7, no definite CT correlate.

Incidental CT findings: Gastric bypass. Rectus diastasis with small
herniation of adipose tissue and loops of bowel. Sigmoid colon
diverticulosis. 4.6 by 3.3 cm densely calcified presumed uterine
fundal fibroid. Scattered diverticula in the ascending colon.
Aortoiliac atherosclerotic vascular disease.

SKELETON: Small focus of right antecubital activity is felt to be
injection related. No focal skeletal lesion is identified.

Incidental CT findings: Right total hip prosthesis. Degenerative
facet arthropathy in the lower lumbar spine. Left total shoulder
prosthesis. Severe degenerative arthropathy of the right
glenohumeral joint. Grade 1 degenerative anterolisthesis at L4-5.
IMPRESSION: 1. 7.3 cm left upper lobe mass, maximum SUV 14.9, compatible with
malignancy.
2. 1.0 cm in short axis AP window lymph node has a maximum SUV of
3.4, above mediastinal blood pool activity in just below liver
activity, concerning for early metastatic disease.
3. Focus of accentuated metabolic activity in the cecum, maximum SUV
11.7, but without definite CT correlate. This could be from an
occult polyp or occult lesion, but could also be physiologic.
Correlate with the patient's colon screening history in determining
appropriateness of further workup such as colonoscopy.
4. Other imaging findings of potential clinical significance: Aortic
Atherosclerosis ([52]-[52]). Gastric bypass. Rectus diastasis with
herniation of adipose tissue and loops of small bowel. Calcified
uterine fibroid. Colonic diverticulosis.

## 2018-12-16 IMAGING — MR MR HEAD WO/W CM
6 of 13 series · 23 of 48 positions shown · IV contrast (gadavist)
Comparison: No pertinent prior studies available for comparison.

CLINICAL DATA: Adenocarcinoma of left lung.  Lung cancer, weakness

EXAM:
MRI HEAD WITHOUT AND WITH CONTRAST
TECHNIQUE: Multiplanar, multiecho pulse sequences of the brain and surrounding
structures were obtained without and with intravenous contrast.
CONTRAST:  10mL GADAVIST GADOBUTROL 1 MMOL/ML IV SOLN

[Series 2: DWI · axial · 3.0mm · 0.82mm/px · z∈[-76,+70]mm · 4 of 50 slices shown (1 of 2)]
[im 1/50]
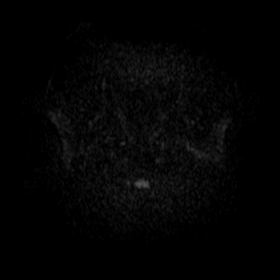
[im 17/50]
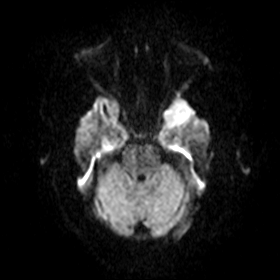
[im 33/50]
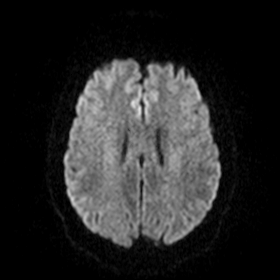
[im 50/50]
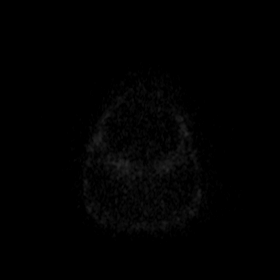

[Series 4: DWI · coronal · 5.0mm · 0.54mm/px · 3 of 34 slices shown (2 of 2)]
[im 1/34]
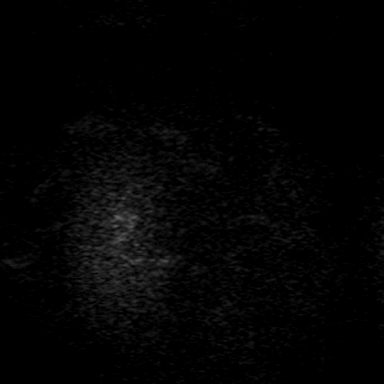
[im 17/34]
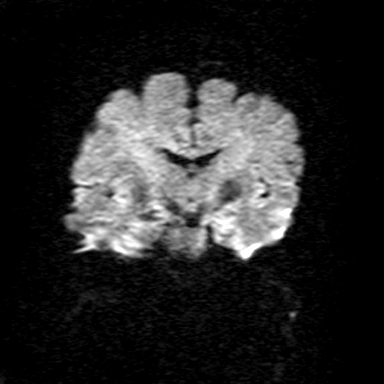
[im 34/34]
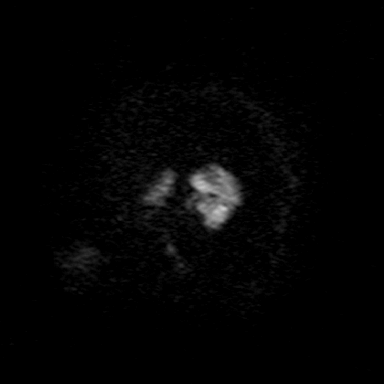

[Series 7: T2 · axial · 5.0mm · 0.60mm/px · 1 of 23 slices shown]
[im 1/23]
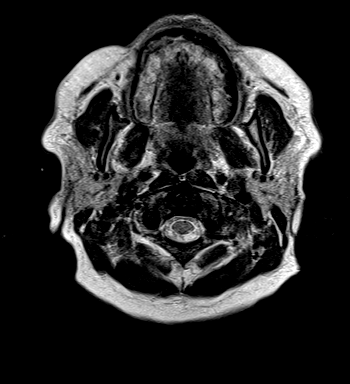

[Series 9: FLAIR · axial · 3.0mm · 0.41mm/px · z∈[-67,+70]mm · 4 of 47 slices shown]
[im 1/47]
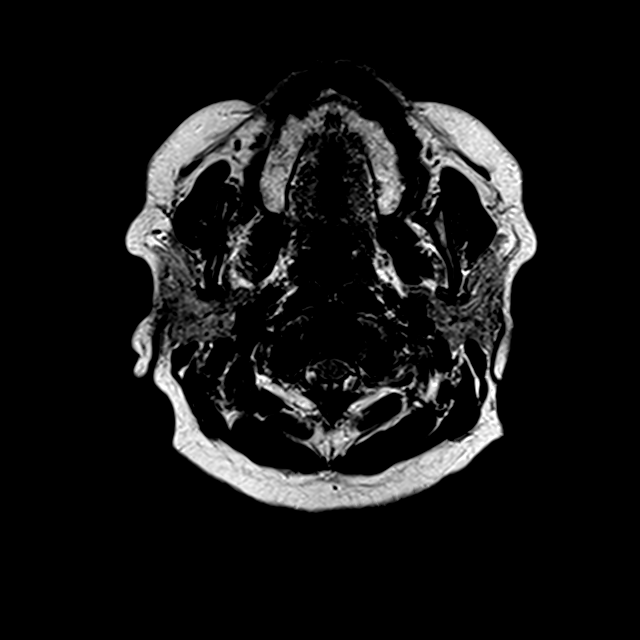
[im 16/47]
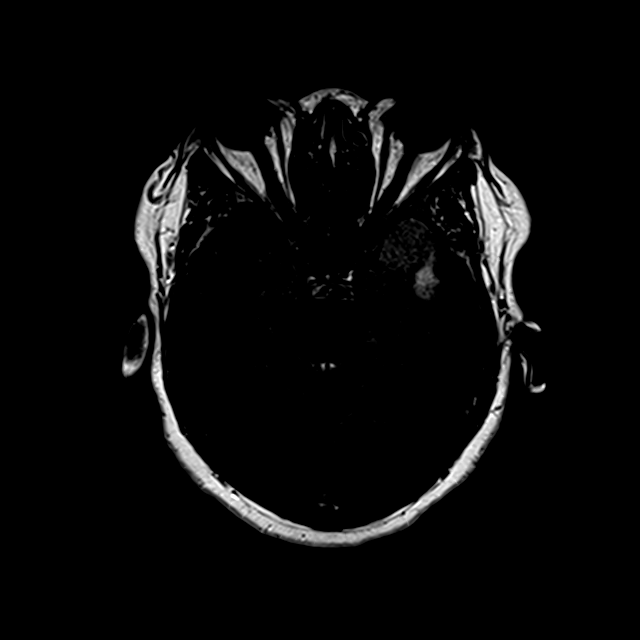
[im 31/47]
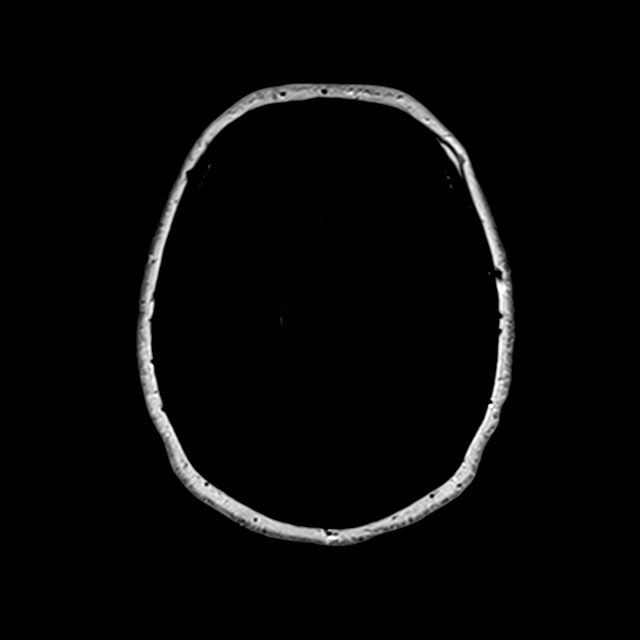
[im 47/47]
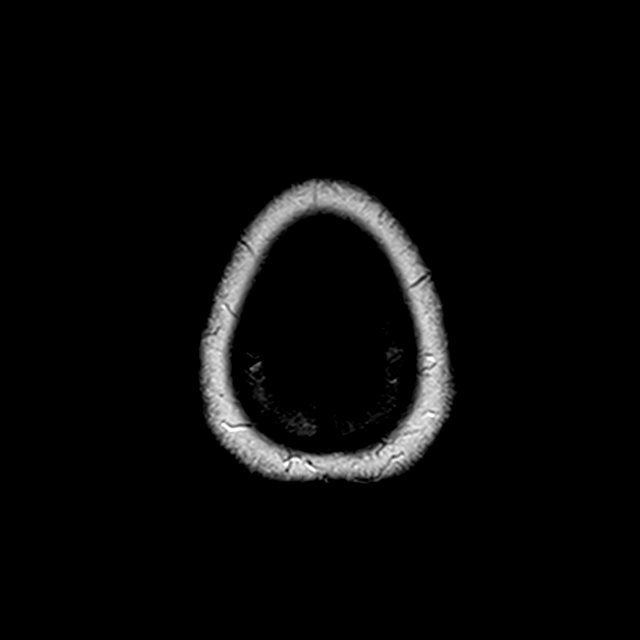

[Series 12: T1 post-contrast · axial · 2.0mm · 0.45mm/px · z∈[-83,+104]mm · 9 of 95 slices shown (1 of 2)]
[im 1/95]
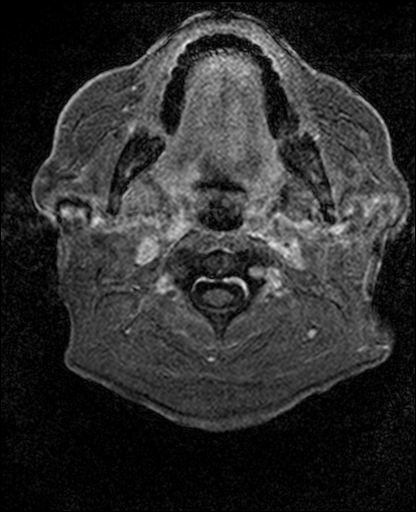
[im 12/95]
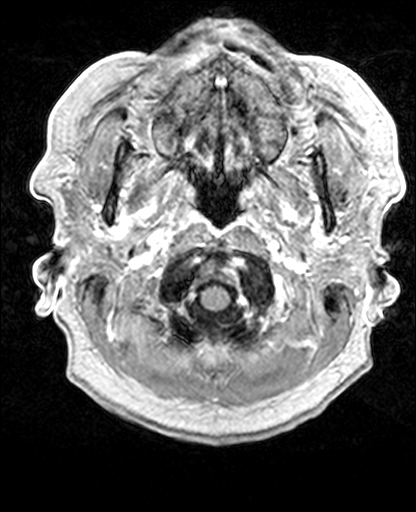
[im 24/95]
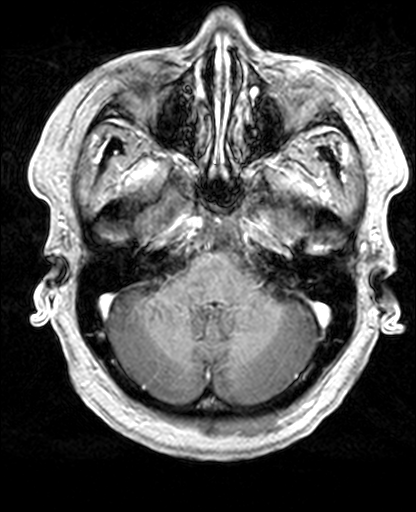
[im 36/95]
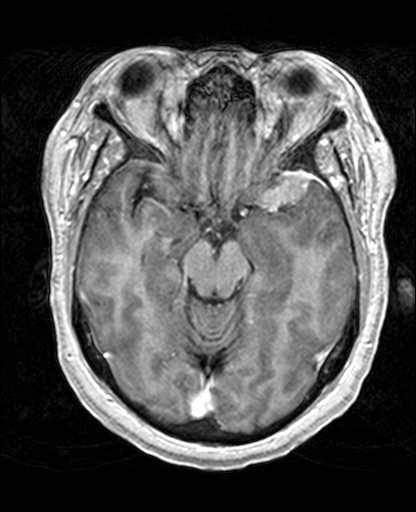
[im 48/95]
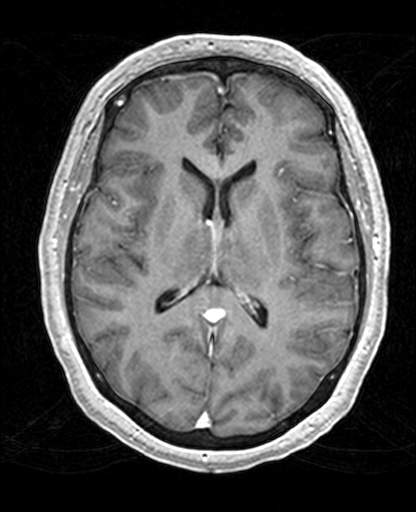
[im 59/95]
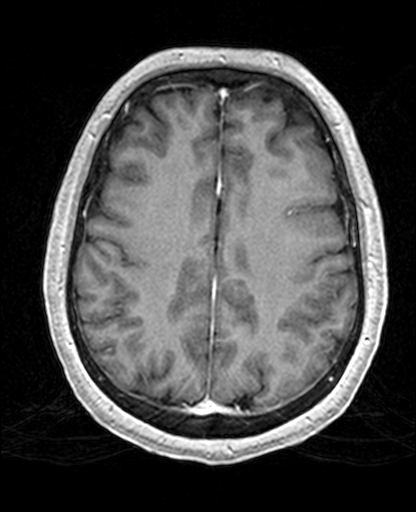
[im 71/95]
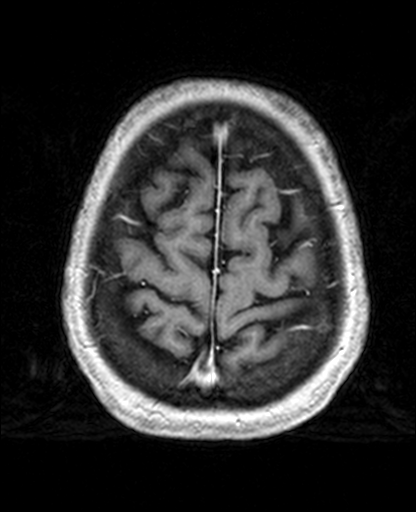
[im 83/95]
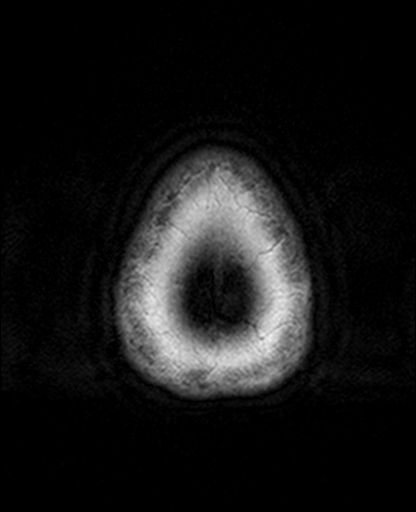
[im 95/95]
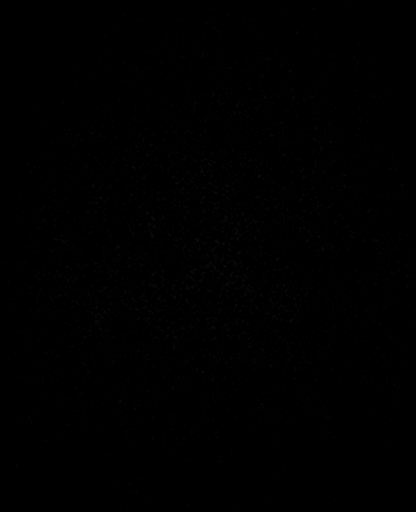

[Series 13: T1 post-contrast · coronal · 5.0mm · 0.42mm/px · 2 of 26 slices shown (2 of 2)]
[im 1/26]
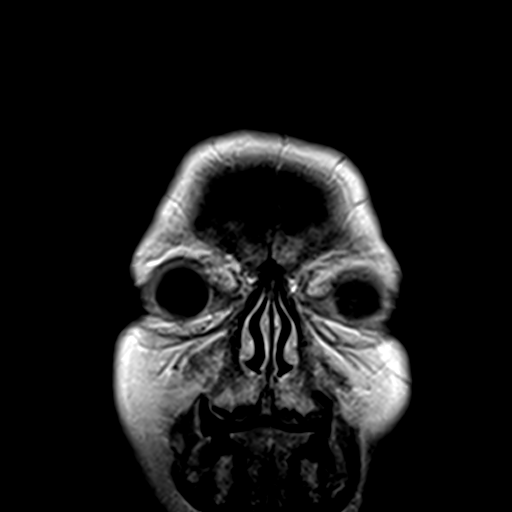
[im 26/26]
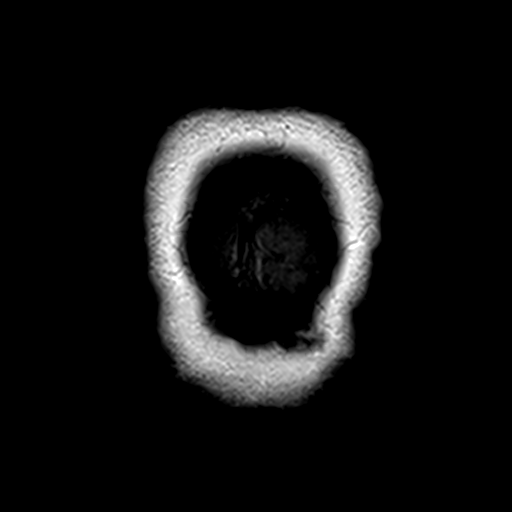

[23 of 48 positions shown; findings below may reference images not displayed]

FINDINGS: Brain:

There is a homogeneously enhancing extra-axial dural-based mass
extending along the left greater wing of sphenoid, which measures
1.8 x 2.6 x 2.6 cm (AP x TV x CC) (series 12, image 33). There is
mass effect upon, and mild vasogenic edema within, the anterior left
temporal lobe. The mass may abut portions of the M1 left MCA as well
as proximal M2 branches. There is no midline shift. The basal
cisterns are patent. No other enhancing intracranial lesions are
identified.

There is no convincing evidence of acute infarct. No extra-axial
fluid collection. No chronic intracranial blood products. Cerebral
volume is normal for age. Partially empty and slightly expanded
sella turcica

Vascular: Flow voids maintained within the proximal large arterial
vessels.

Skull and upper cervical spine: Nonspecific diffuse T1 hypointense
marrow signal within the calvarium and visualized cervical spine. No
focal marrow lesion is identified.

Sinuses/Orbits: Visualized orbits demonstrate no acute abnormality.
Minimal ethmoid sinus mucosal thickening. No significant mastoid
effusion

Impressions 1 and 2 below will be called to the ordering clinician
or representative by the Radiologist Assistant, and communication
documented in the PACS or zVision Dashboard.
IMPRESSION: 1. 1.8 x 2.6 x 2.6 cm homogeneously enhancing extra-axial mass along
the left greater wing of sphenoid. The mass has an imaging
appearance most suggestive of a meningioma. However, a dural-based
metastatic lesion cannot be excluded and short interval MRI
follow-up is recommended to exclude rapid growth. Mass effect upon
the underlying anterior left temporal lobe with associated mild
vasogenic edema.
2. No other enhancing intracranial lesions are identified.
3. Nonspecific diffuse T1 hypointense marrow signal within the
calvarium and visualized cervical spine, nonspecific, but possibly
reflecting treatment associated red marrow reconversion.

## 2018-12-16 MED ORDER — GADOBUTROL 1 MMOL/ML IV SOLN
10.0000 mL | Freq: Once | INTRAVENOUS | Status: AC | PRN
  Administered 2018-12-16: 10 mL via INTRAVENOUS

## 2018-12-17 DIAGNOSIS — C3412 Malignant neoplasm of upper lobe, left bronchus or lung: Secondary | ICD-10-CM | POA: Diagnosis not present

## 2018-12-17 MED ORDER — FLUDEOXYGLUCOSE F - 18 (FDG) INJECTION
16.4000 | Freq: Once | INTRAVENOUS | Status: AC | PRN
  Administered 2018-12-16: 16.4 via INTRAVENOUS

## 2018-12-18 ENCOUNTER — Inpatient Hospital Stay (HOSPITAL_BASED_OUTPATIENT_CLINIC_OR_DEPARTMENT_OTHER): Payer: Medicare Other | Admitting: Hematology

## 2018-12-18 ENCOUNTER — Other Ambulatory Visit: Payer: Self-pay

## 2018-12-18 ENCOUNTER — Encounter (HOSPITAL_COMMUNITY): Payer: Self-pay | Admitting: Hematology

## 2018-12-18 VITALS — BP 140/61 | HR 93 | Temp 97.5°F | Resp 20 | Wt 277.6 lb

## 2018-12-18 DIAGNOSIS — E119 Type 2 diabetes mellitus without complications: Secondary | ICD-10-CM | POA: Diagnosis not present

## 2018-12-18 DIAGNOSIS — C3492 Malignant neoplasm of unspecified part of left bronchus or lung: Secondary | ICD-10-CM | POA: Diagnosis not present

## 2018-12-18 DIAGNOSIS — Z79899 Other long term (current) drug therapy: Secondary | ICD-10-CM | POA: Diagnosis not present

## 2018-12-18 DIAGNOSIS — Z7982 Long term (current) use of aspirin: Secondary | ICD-10-CM | POA: Diagnosis not present

## 2018-12-18 DIAGNOSIS — C3412 Malignant neoplasm of upper lobe, left bronchus or lung: Secondary | ICD-10-CM | POA: Diagnosis not present

## 2018-12-18 DIAGNOSIS — I10 Essential (primary) hypertension: Secondary | ICD-10-CM | POA: Diagnosis not present

## 2018-12-18 DIAGNOSIS — Z87891 Personal history of nicotine dependence: Secondary | ICD-10-CM | POA: Diagnosis not present

## 2018-12-18 NOTE — Progress Notes (Signed)
[place], [place] [address]   CLINIC:  Medical Oncology/Hematology  PCP:  Iona Beard, MD [address] [place] [address] [phone]   REASON FOR VISIT:  Follow-up for left lung adenocarcinoma.  CURRENT THERAPY: Treatment plan and progress.    INTERVAL HISTORY:  Denise [name] 68 y.o. female seen for follow-up of her left lung cancer.  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  She also complained of left hemifacial pain mainly around the left ear, left jaw and left side of the neck started 2 weeks ago.  If she takes ibuprofen, she feels better.  She also complained of generalized discomfort.  Shortness of breath on exertion is present.  Leg swellings are stable.  Appetite is 75%.  Energy levels are low.  Denies any headaches or vision changes.  No hearing difficulty.    REVIEW OF SYSTEMS:  Review of Systems  Respiratory: Positive for shortness of breath.   Cardiovascular: Positive for leg swelling.  All other systems reviewed and are negative.    PAST MEDICAL/SURGICAL HISTORY:  Past Medical History:  Diagnosis Date  . Anemia   . Arthritis   . Diabetes mellitus    diet controlled  . GERD (gastroesophageal reflux disease)   . HTN (hypertension)   . HTN (hypertension)   . Shortness of breath dyspnea    with exertion   Past Surgical History:  Procedure Laterality Date  . CHOLECYSTECTOMY  1997  . COLONOSCOPY    . GASTRIC BYPASS    . INCISIONAL HERNIA REPAIR  04/11/11  . LAPAROSCOPIC SALPINGOOPHERECTOMY    . LAPAROTOMY  04/11/2011   Procedure: EXPLORATORY LAPAROTOMY;  Surgeon: Joyice Faster. Cornett, MD;  Location: [facility];  Service: General;  Laterality: N/A;  closure port hole  . TOTAL HIP ARTHROPLASTY  03/07/2012   Procedure: TOTAL HIP ARTHROPLASTY ANTERIOR APPROACH;  Surgeon: Mauri Pole, MD;  Location: [facility];  Service: Orthopedics;  Laterality: Right;  . TOTAL SHOULDER ARTHROPLASTY Left 01/21/2015  .  TOTAL SHOULDER ARTHROPLASTY Left 01/21/2015   Procedure: LEFT TOTAL SHOULDER ARTHROPLASTY;  Surgeon: Justice Britain, MD;  Location: [facility];  Service: Orthopedics;  Laterality: Left;  Marland Kitchen VAGINAL HYSTERECTOMY       SOCIAL HISTORY:  Social History   Socioeconomic History  . Marital status: Single    Spouse name: Not on file  . Number of children: Not on file  . Years of education: 12th grade  . Highest education level: Not on file  Occupational History  . Occupation: Employed    Employer: [employer]  . Financial resource strain: Not very hard  . Food insecurity    Worry: Never true    Inability: Never true  . Transportation needs    Medical: No    Non-medical: No  Tobacco Use  . Smoking status: Former Smoker    Packs/day: 1.00    Years: 12.00    Pack years: 12.00    Types: Cigarettes    Quit date: 03/20/1976    Years since quitting: 42.7  . Smokeless tobacco: Never Used  Substance and Sexual Activity  . Alcohol use: No  . Drug use: No  . Sexual activity: Never  Lifestyle  . Physical activity    Days per week: 0 days    Minutes per session: 0 min  . Stress: Only a little  Relationships  . Social Herbalist on phone:  More than three times a week    Gets together: Once a week    Attends religious service: More than 4 times per year    Active member of club or organization: No    Attends meetings of clubs or organizations: Never    Relationship status: Never married  . Intimate partner violence    Fear of current or ex partner: No    Emotionally abused: No    Physically abused: No    Forced sexual activity: No  Other Topics Concern  . Not on file  Social History Narrative  . Not on file    FAMILY HISTORY:  Family History  Problem Relation Age of Onset  . Breast cancer Mother   . COPD Mother   . Arthritis Mother   . Diabetes Mother   . Hypertension Mother   . Hypertension Father   . Diabetes Father   . Breast cancer Sister    . Thyroid cancer Brother   . Huntington's disease Maternal Grandmother   . Heart attack Brother     CURRENT MEDICATIONS:  Outpatient Encounter Medications as of 12/18/2018  Medication Sig  . amLODipine (NORVASC) 5 MG tablet Take 5 mg by mouth every morning.   . Calcium Carbonate Antacid (TUMS PO) Take 2 tablets by mouth 2 (two) times daily.   . Cyanocobalamin (VITAMIN B-12) 2500 MCG SUBL Place 2,500 mcg under the tongue every morning.   . ferrous sulfate 325 (65 FE) MG tablet Take 1 tablet (325 mg total) by mouth 3 (three) times daily after meals. (Patient taking differently: Take 325 mg by mouth daily. )  . Multiple Vitamin (MULITIVITAMIN WITH MINERALS) TABS Take 1 tablet by mouth every morning.   Marland Kitchen omeprazole (PRILOSEC) 20 MG capsule Take 20 mg by mouth daily.  Marland Kitchen gabapentin (NEURONTIN) 300 MG capsule Take 300 mg by mouth at bedtime as needed for pain.  Marland Kitchen ibuprofen (ADVIL,MOTRIN) 800 MG tablet Take 1 tablet (800 mg total) by mouth 3 (three) times daily. (Patient not taking: Reported on 12/06/2018)  . [DISCONTINUED] aspirin EC 81 MG tablet Take 81 mg by mouth daily.   No facility-administered encounter medications on file as of 12/18/2018.     ALLERGIES:  No Known Allergies   PHYSICAL EXAM:  ECOG Performance status: 0  Vitals:   12/18/18 1516  BP: 140/61  Pulse: 93  Resp: 20  Temp: (!) 97.5 F (36.4 C)  SpO2: 99%   Filed Weights   12/18/18 1516  Weight: 277 lb 9.6 oz (125.9 kg)    Physical Exam Vitals signs reviewed.  Constitutional:      Appearance: Normal appearance.  Cardiovascular:     Rate and Rhythm: Normal rate and regular rhythm.     Heart sounds: Normal heart sounds.  Pulmonary:     Effort: Pulmonary effort is normal.     Breath sounds: Normal breath sounds.  Abdominal:     General: There is no distension.     Palpations: Abdomen is soft. There is no mass.  Musculoskeletal:        General: Swelling present.  Lymphadenopathy:     Cervical: No cervical  adenopathy.  Skin:    General: Skin is warm.  Neurological:     General: No focal deficit present.     Mental Status: She is alert and oriented to person, place, and time.  Psychiatric:        Mood and Affect: Mood normal.        Behavior: Behavior  normal.      LABORATORY DATA:  I have reviewed the labs as listed.  CBC    Component Value Date/Time   WBC 8.1 12/09/2018 1449   RBC 4.69 12/09/2018 1449   HGB 11.7 (L) 12/09/2018 1449   HCT 39.6 12/09/2018 1449   PLT 493 (H) 12/09/2018 1449   MCV 84.4 12/09/2018 1449   MCH 24.9 (L) 12/09/2018 1449   MCHC 29.5 (L) 12/09/2018 1449   RDW 15.8 (H) 12/09/2018 1449   LYMPHSABS 1.9 12/09/2018 1449   MONOABS 0.6 12/09/2018 1449   EOSABS 0.1 12/09/2018 1449   BASOSABS 0.0 12/09/2018 1449   CMP Latest Ref Rng & Units 12/09/2018 01/14/2015 03/08/2012  Glucose 70 - 99 mg/dL 118(H) 101(H) 117(H)  BUN 8 - 23 mg/dL 11 9 8   Creatinine 0.44 - 1.00 mg/dL 0.64 0.72 0.76  Sodium 135 - 145 mmol/L 143 143 137  Potassium 3.5 - 5.1 mmol/L 4.1 3.6 3.9  Chloride 98 - 111 mmol/L 106 107 102  CO2 22 - 32 mmol/L 28 28 27   Calcium 8.9 - 10.3 mg/dL 9.2 9.2 8.6  Total Protein 6.5 - 8.1 g/dL 7.3 6.2(L) -  Total Bilirubin 0.3 - 1.2 mg/dL <0.1(L) 0.3 -  Alkaline Phos 38 - 126 U/L 121 95 -  AST 15 - 41 U/L 22 16 -  ALT 0 - 44 U/L 16 17 -       DIAGNOSTIC IMAGING:  I have independently reviewed the scans and discussed with the patient.    ASSESSMENT & PLAN:   Adenocarcinoma of left lung ([place]) 1.  Clinical stage IIIb (T3N2) adenocarcinoma the left lung: - Presentation with hemoptysis, CT of the chest on 10/17/2018 showing left upper lobe lung mass and enlarged AP window lymph node. - CT-guided biopsy on 11/19/2018 consistent with adenocarcinoma. -He smoked 1 pack/day for 15 years, quit 25 years ago.  Last episode of hemoptysis was 2 weeks ago. - We reviewed results of the PET scan dated 12/17/2018 which showed 7.3 cm left upper lobe mass, 1 cm short  axis AP window lymph node with SUV 3.4.  Focus of increased metabolic activity in the cecum, without definite CT correlate.  Patient reported having colonoscopy 3 to 4 years ago which was normal. - We reviewed results of MRI of the brain dated 12/16/2018 which showed 1.8 x 2.6 x 2.6 cm enhancing extra-axial mass along the left greater wing of sphenoid.  The mass has imaging appearance of meningioma.  However dural based metastatic lesion cannot be excluded. - She has been having left-sided facial pain, in the left ear, left side of the jaw and left side of the neck for the past 10 days.  Ibuprofen relieves it. -Because of these symptoms, I have recommended seeking opinion from radiation oncology about SBRT to the lesion. - Even if the brain lesion is considered metastatic, once it is treated with SBRT, we can treat lung primary and mediastinal adenopathy has stage III disease.  Hence, I have recommended combination chemoradiation therapy with weekly carboplatin and paclitaxel.  I have made a referral to [facility] radiation oncology. -I have also recommended port placement.  Will place referral. -I will see her back in 2 weeks for follow-up.      Orders placed this encounter:  Orders Placed This Encounter  Procedures  . IR IMAGING GUIDED PORT INSERTION   Total time spent is 40 minutes with more than 50% of the time spent face-to-face discussing scan results, treatment plan, counseling and coordination  of care.   Derek Jack, MD [place] [phone]

## 2018-12-18 NOTE — Progress Notes (Signed)
I met with patient during the visit with Dr. Delton Coombes today. I was able to answer a few questions for her about her upcoming appointments and next steps.  She was advised to call me should she get home and have any further questions. She verbalizes understanding.

## 2018-12-18 NOTE — Assessment & Plan Note (Signed)
1.  Clinical stage IIIb (T3N2) adenocarcinoma the left lung: - Presentation with hemoptysis, CT of the chest on 10/17/2018 showing left upper lobe lung mass and enlarged AP window lymph node. - CT-guided biopsy on 11/19/2018 consistent with adenocarcinoma. -He smoked 1 pack/day for 15 years, quit 25 years ago.  Last episode of hemoptysis was 2 weeks ago. - We reviewed results of the PET scan dated 12/17/2018 which showed 7.3 cm left upper lobe mass, 1 cm short axis AP window lymph node with SUV 3.4.  Focus of increased metabolic activity in the cecum, without definite CT correlate.  Patient reported having colonoscopy 3 to 4 years ago which was normal. - We reviewed results of MRI of the brain dated 12/16/2018 which showed 1.8 x 2.6 x 2.6 cm enhancing extra-axial mass along the left greater wing of sphenoid.  The mass has imaging appearance of meningioma.  However dural based metastatic lesion cannot be excluded. - She has been having left-sided facial pain, in the left ear, left side of the jaw and left side of the neck for the past 10 days.  Ibuprofen relieves it. -Because of these symptoms, I have recommended seeking opinion from radiation oncology about SBRT to the lesion. - Even if the brain lesion is considered metastatic, once it is treated with SBRT, we can treat lung primary and mediastinal adenopathy has stage III disease.  Hence, I have recommended combination chemoradiation therapy with weekly carboplatin and paclitaxel.  I have made a referral to [facility] radiation oncology. -I have also recommended port placement.  Will place referral. -I will see her back in 2 weeks for follow-up.

## 2018-12-23 NOTE — Progress Notes (Signed)
Thoracic Location of Tumor / Histology: left upper lobe lung mass and enlarged AP window lymph node  Patient presented with symptoms of: Presentation with hemoptysis, CT of the chest on 10/17/2018 showing left upper lobe lung mass and enlarged AP window lymph node.  Biopsies revealed: 11/19/18:  Diagnosis Lung, needle/core biopsy(ies), Left upper lobe - ADENOCARCINOMA  Tobacco/Marijuana/Snuff/ETOH use: He smoked 1 pack/day for 15 years, quit 25 years ago.  Tobacco Use  . Smoking status: Former Smoker    Packs/day: 1.00    Years: 12.00    Pack years: 12.00    Types: Cigarettes    Quit date: 03/20/1976    Years since quitting: 42.7  . Smokeless tobacco: Never Used  Substance and Sexual Activity  . Alcohol use: No  . Drug use: No     Past/Anticipated interventions by cardiothoracic surgery, if any: None at this time.  Past/Anticipated interventions by medical oncology, if any: Per Dr. Delton Coombes 12/18/18:  1.  Clinical stage IIIb (T3N2) adenocarcinoma the left lung: - Presentation with hemoptysis, CT of the chest on 10/17/2018 showing left upper lobe lung mass and enlarged AP window lymph node. - CT-guided biopsy on 11/19/2018 consistent with adenocarcinoma. -He smoked 1 pack/day for 15 years, quit 25 years ago.  Last episode of hemoptysis was 2 weeks ago. - We reviewed results of the PET scan dated 12/17/2018 which showed 7.3 cm left upper lobe mass, 1 cm short axis AP window lymph node with SUV 3.4.  Focus of increased metabolic activity in the cecum, without definite CT correlate.  Patient reported having colonoscopy 3 to 4 years ago which was normal. - We reviewed results of MRI of the brain dated 12/16/2018 which showed 1.8 x 2.6 x 2.6 cm enhancing extra-axial mass along the left greater wing of sphenoid.  The mass has imaging appearance of meningioma.  However dural based metastatic lesion cannot be excluded. - She has been having left-sided facial pain, in the left ear, left side  of the jaw and left side of the neck for the past 10 days.  Ibuprofen relieves it. -Because of these symptoms, I have recommended seeking opinion from radiation oncology about SBRT to the lesion. - Even if the brain lesion is considered metastatic, once it is treated with SBRT, we can treat lung primary and mediastinal adenopathy has stage III disease.  Hence, I have recommended combination chemoradiation therapy with weekly carboplatin and paclitaxel.  I have made a referral to [facility] radiation oncology. -I have also recommended port placement.  Will place referral. -I will see her back in 2 weeks for follow-up.  Signs/Symptoms Weight changes, if any:  Wt Readings from Last 3 Encounters:  12/25/18 275 lb 4 oz (124.9 kg)  12/18/18 277 lb 9.6 oz (125.9 kg)  12/09/18 276 lb 12.8 oz (125.6 kg)       Respiratory complaints, if any: She also complained of left hemifacial pain mainly around the left ear, left jaw and left side of the neck started 2 weeks ago.  If she takes ibuprofen, she feels better.  She also complained of generalized discomfort.  Shortness of breath on exertion is present.  Leg swellings are stable.  Appetite is 75%.  Energy levels are low.  Denies any headaches or vision changes.  No hearing difficulty.  Hemoptysis, if any: Pt reports small amount of hemoptysis on Sunday am 12/22/18.  Pain issues, if any:  Pt denies c/o pain.  SAFETY ISSUES:  Prior radiation? No  Pacemaker/ICD? No  Possible  current pregnancy? No  Is the patient on methotrexate? No  Current Complaints / other details:  Pt presents today for initial consult with Dr. Sondra Come for Radiation Oncology.  BP 110/69 (BP Location: Right Arm, Patient Position: Sitting)   Pulse (!) 4   Temp 98.2 F (36.8 C) (Temporal)   Resp 18   Ht 5' 2.5" (1.588 m)   Wt 275 lb 4 oz (124.9 kg)   SpO2 100%   BMI 49.54 kg/m  Loma Sousa, RN BSN

## 2018-12-25 ENCOUNTER — Other Ambulatory Visit: Payer: Self-pay

## 2018-12-25 ENCOUNTER — Encounter: Payer: Self-pay | Admitting: Radiation Oncology

## 2018-12-25 ENCOUNTER — Other Ambulatory Visit: Payer: Self-pay | Admitting: Radiation Therapy

## 2018-12-25 ENCOUNTER — Ambulatory Visit
Admission: RE | Admit: 2018-12-25 | Discharge: 2018-12-25 | Disposition: A | Discharge status: Home / Self Care | Payer: Medicare Other | Source: Ambulatory Visit | Attending: Radiation Oncology | Admitting: Radiation Oncology

## 2018-12-25 VITALS — BP 110/69 | HR 4 | Temp 98.2°F | Resp 18 | Ht 62.5 in | Wt 275.2 lb

## 2018-12-25 DIAGNOSIS — C3492 Malignant neoplasm of unspecified part of left bronchus or lung: Secondary | ICD-10-CM

## 2018-12-25 DIAGNOSIS — Z87891 Personal history of nicotine dependence: Secondary | ICD-10-CM | POA: Insufficient documentation

## 2018-12-25 DIAGNOSIS — D649 Anemia, unspecified: Secondary | ICD-10-CM | POA: Diagnosis not present

## 2018-12-25 DIAGNOSIS — G939 Disorder of brain, unspecified: Secondary | ICD-10-CM | POA: Insufficient documentation

## 2018-12-25 DIAGNOSIS — K219 Gastro-esophageal reflux disease without esophagitis: Secondary | ICD-10-CM | POA: Diagnosis not present

## 2018-12-25 DIAGNOSIS — Z803 Family history of malignant neoplasm of breast: Secondary | ICD-10-CM | POA: Diagnosis not present

## 2018-12-25 DIAGNOSIS — K5730 Diverticulosis of large intestine without perforation or abscess without bleeding: Secondary | ICD-10-CM | POA: Diagnosis not present

## 2018-12-25 DIAGNOSIS — I10 Essential (primary) hypertension: Secondary | ICD-10-CM | POA: Diagnosis not present

## 2018-12-25 DIAGNOSIS — C3412 Malignant neoplasm of upper lobe, left bronchus or lung: Secondary | ICD-10-CM | POA: Diagnosis not present

## 2018-12-25 DIAGNOSIS — M4316 Spondylolisthesis, lumbar region: Secondary | ICD-10-CM | POA: Diagnosis not present

## 2018-12-25 DIAGNOSIS — Z79899 Other long term (current) drug therapy: Secondary | ICD-10-CM | POA: Insufficient documentation

## 2018-12-25 DIAGNOSIS — E119 Type 2 diabetes mellitus without complications: Secondary | ICD-10-CM | POA: Insufficient documentation

## 2018-12-25 DIAGNOSIS — Z808 Family history of malignant neoplasm of other organs or systems: Secondary | ICD-10-CM | POA: Diagnosis not present

## 2018-12-25 NOTE — Progress Notes (Signed)
Radiation Oncology         [phone] ________________________________  Initial Outpatient Consultation  Name: Denise [name] MRN: [id number]  Date: 12/25/2018  DOB: May 10, 1950  PH:XTAV, Berneta Sages, MD  Derek Jack, MD   REFERRING PHYSICIAN: Derek Jack, MD  DIAGNOSIS: The encounter diagnosis was Adenocarcinoma of left lung [facility]).  Clinical stage IIIb (T3N2) adenocarcinoma the left lung  HISTORY OF PRESENT ILLNESS::Denise [name] is a 68 y.o. female who is accompanied by no one due to Covid-19 restrictions. The patient presented to her PCP with 2 consecutive days of blood-tinged sputum on 10/07/2018. She proceeded to chest CT on 10/17/2018, which showed: a 6.6 cm LUL mass; an enlarged AP window lymph node measuring 3 cm.  The patient was referred to Dr. Pascal Lux for biopsy on 11/19/2018, which showed adenocarcinoma. She was referred to Dr. Delton Coombes on 12/09/2018, who ordered PET scan and brain MRI.   Brain MRI performed on 12/16/2018 revealed: 2.6 cm homogeneously enhancing extra-axial mass along the left greater wing of sphenoid with imaging appearance of a meningioma; no other intracranial lesions.  PET scan performed the same day showed: 7.3 cm LUL mass; 1 cm AP window lymph node; focus of accentuated metabolic activity in the cecum without definite CT correlate.  She last saw Dr. Delton Coombes on 12/18/2018, and she reported increasing left-sided facial pain, and left ear, jaw, and neck pain.  This left-sided facial pain is been present on and off for several months.  PREVIOUS RADIATION THERAPY: No  PAST MEDICAL HISTORY:  Past Medical History:  Diagnosis Date   Anemia    Arthritis    Diabetes mellitus    diet controlled   GERD (gastroesophageal reflux disease)    HTN (hypertension)    HTN (hypertension)    Shortness of breath dyspnea    with exertion    PAST SURGICAL HISTORY: Past Surgical History:  Procedure Laterality Date   CHOLECYSTECTOMY  1997    COLONOSCOPY     GASTRIC BYPASS     INCISIONAL HERNIA REPAIR  04/11/11   LAPAROSCOPIC SALPINGOOPHERECTOMY     LAPAROTOMY  04/11/2011   Procedure: EXPLORATORY LAPAROTOMY;  Surgeon: Joyice Faster. Cornett, MD;  Location: [facility];  Service: General;  Laterality: N/A;  closure port hole   TOTAL HIP ARTHROPLASTY  03/07/2012   Procedure: TOTAL HIP ARTHROPLASTY ANTERIOR APPROACH;  Surgeon: Mauri Pole, MD;  Location: [facility];  Service: Orthopedics;  Laterality: Right;   TOTAL SHOULDER ARTHROPLASTY Left 01/21/2015   TOTAL SHOULDER ARTHROPLASTY Left 01/21/2015   Procedure: LEFT TOTAL SHOULDER ARTHROPLASTY;  Surgeon: Justice Britain, MD;  Location: [facility];  Service: Orthopedics;  Laterality: Left;   VAGINAL HYSTERECTOMY      FAMILY HISTORY:  Family History  Problem Relation Age of Onset   Breast cancer Mother    COPD Mother    Arthritis Mother    Diabetes Mother    Hypertension Mother    Hypertension Father    Diabetes Father    Breast cancer Sister    Thyroid cancer Brother    Huntington's disease Maternal Grandmother    Heart attack Brother     SOCIAL HISTORY: 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      Calcium Carbonate Antacid (TUMS PO) Take 4 tablets by mouth daily.      Cyanocobalamin (VITAMIN B-12) 2500 MCG SUBL Place 2,500 mcg under the tongue every morning.      ferrous sulfate 325 (65 FE) MG tablet Take 1 tablet (325 mg total) by mouth 3 (three) times daily after meals. (Patient taking  differently: Take 325 mg by mouth daily. )     gabapentin (NEURONTIN) 300 MG capsule Take 300 mg by mouth at bedtime as needed for pain.     ibuprofen (ADVIL,MOTRIN) 800 MG tablet Take 1 tablet (800 mg total) by mouth 3 (three) times daily. (Patient taking differently: Take 800 mg by mouth every 8 (eight) hours as needed. ) 90 tablet 2   Multiple Vitamin (MULITIVITAMIN WITH MINERALS) TABS Take 1 tablet by mouth every morning.      omeprazole (PRILOSEC) 20 MG capsule Take 20 mg by mouth daily.     No current facility-administered medications for this encounter.     REVIEW OF SYSTEMS:  A 10+ POINT REVIEW OF SYSTEMS WAS OBTAINED including neurology, dermatology, psychiatry, cardiac, respiratory, lymph, extremities, GI, GU, musculoskeletal, constitutional, reproductive, HEENT. Her most recent episode of hemoptysis was a small amount this past Sunday, 12/22/2018. She reports shortness of breath, generalized discomfort, decreased appetite, and fatigue. She denies headaches, vision changes, hearing changes, and pain.  She reports having difficulty walking up a flight of steps in light of her breathing issues.   PHYSICAL EXAM:  height is 5' 2.5" (1.588 m) and weight is 275 lb 4 oz (124.9 kg). Her temporal temperature is 98.2 F (36.8 C). Her blood pressure is 110/69 and her pulse is 4 (abnormal). Her respiration is 18 and oxygen saturation is 100%.   General: Alert and oriented, in no acute distress HEENT: Head is normocephalic. Extraocular movements are intact. Oropharynx is clear. Neck: Neck is supple, no palpable cervical or supraclavicular lymphadenopathy. Heart: Regular in rate and rhythm with no murmurs, rubs, or gallops. Chest: Clear to auscultation bilaterally, with no rhonchi, wheezes, or rales. Abdomen: Soft, nontender, nondistended, with no rigidity or guarding. Extremities: No cyanosis or edema. Lymphatics: see Neck Exam Skin: No concerning lesions. Musculoskeletal: symmetric strength  and muscle tone throughout. Neurologic: Cranial nerves II through XII are grossly intact. No obvious focalities. Speech is fluent. Coordination is intact. Psychiatric: Judgment and insight are intact. Affect is appropriate.   ECOG = 1  0 - Asymptomatic (Fully active, able to carry on all predisease activities without restriction)  1 - Symptomatic but completely ambulatory (Restricted in physically strenuous activity but ambulatory and able to carry out work of a light or sedentary nature. For example, light housework, office work)  2 - Symptomatic, <50% in bed during the day (Ambulatory and capable of all self care but unable to carry out any work activities. Up and about more than 50% of waking hours)  3 - Symptomatic, >50% in bed, but not bedbound (Capable of only limited self-care, confined to bed or chair 50% or more of waking hours)  4 - Bedbound (Completely disabled. Cannot carry on any self-care. Totally confined to bed or chair)  5 - Death   Lolita Rieger, Creech RH, Tormey DC, et  al. [phone]). "Toxicity and response criteria of the [facility]". [place] Oncol. 5 (6): 649-55  LABORATORY DATA:  Lab Results  Component Value Date   WBC 8.1 12/09/2018   HGB 11.7 (L) 12/09/2018   HCT 39.6 12/09/2018   MCV 84.4 12/09/2018   PLT 493 (H) 12/09/2018   NEUTROABS 5.4 12/09/2018   Lab Results  Component Value Date   NA 143 12/09/2018   K 4.1 12/09/2018   CL 106 12/09/2018   CO2 28 12/09/2018   GLUCOSE 118 (H) 12/09/2018   CREATININE 0.64 12/09/2018   CALCIUM 9.2 12/09/2018      RADIOGRAPHY: Mr Jeri Cos OA Contrast  Result Date: 12/16/2018 CLINICAL DATA:  Adenocarcinoma of left lung.  Lung cancer, weakness EXAM: MRI HEAD WITHOUT AND WITH CONTRAST TECHNIQUE: Multiplanar, multiecho pulse sequences of the brain and surrounding structures were obtained without and with intravenous contrast. CONTRAST:  65mL GADAVIST GADOBUTROL 1 MMOL/ML IV SOLN COMPARISON:  No  pertinent prior studies available for comparison. FINDINGS: Brain: There is a homogeneously enhancing extra-axial dural-based mass extending along the left greater wing of sphenoid, which measures 1.8 x 2.6 x 2.6 cm (AP x TV x CC) (series 12, image 33). There is mass effect upon, and mild vasogenic edema within, the anterior left temporal lobe. The mass may abut portions of the M1 left MCA as well as proximal M2 branches. There is no midline shift. The basal cisterns are patent. No other enhancing intracranial lesions are identified. There is no convincing evidence of acute infarct. No extra-axial fluid collection. No chronic intracranial blood products. Cerebral volume is normal for age. Partially empty and slightly expanded sella turcica Vascular: Flow voids maintained within the proximal large arterial vessels. Skull and upper cervical spine: Nonspecific diffuse T1 hypointense marrow signal within the calvarium and visualized cervical spine. No focal marrow lesion is identified. Sinuses/Orbits: Visualized orbits demonstrate no acute abnormality. Minimal ethmoid sinus mucosal thickening. No significant mastoid effusion Impressions 1 and 2 below will be called to the ordering clinician or representative by the Radiologist Assistant, and communication documented in the PACS or zVision Dashboard. IMPRESSION: 1. 1.8 x 2.6 x 2.6 cm homogeneously enhancing extra-axial mass along the left greater wing of sphenoid. The mass has an imaging appearance most suggestive of a meningioma. However, a dural-based metastatic lesion cannot be excluded and short interval MRI follow-up is recommended to exclude rapid growth. Mass effect upon the underlying anterior left temporal lobe with associated mild vasogenic edema. 2. No other enhancing intracranial lesions are identified. 3. Nonspecific diffuse T1 hypointense marrow signal within the calvarium and visualized cervical spine, nonspecific, but possibly reflecting treatment  associated red marrow reconversion. Electronically Signed   By: Kellie Simmering   On: 12/16/2018 17:42   Nm Pet Image Initial (pi) Skull Base To Thigh  Result Date: 12/17/2018 CLINICAL DATA:  Initial treatment strategy for left upper lobe lung cancer. EXAM: NUCLEAR MEDICINE PET SKULL BASE TO THIGH TECHNIQUE: 16.4 mCi F-18 FDG was injected intravenously. Full-ring PET imaging was performed from the skull base to thigh after the radiotracer. CT data was obtained and used for attenuation correction and anatomic localization. Fasting blood glucose: 88 mg/dl COMPARISON:  Multiple exams, including chest CT from 12/18/2018 FINDINGS: Mediastinal blood pool activity: SUV max 2.3 Liver activity: SUV max 3.5 NECK: No significant abnormal hypermetabolic activity in this region. Incidental CT findings: None CHEST: 7.3 by 5.3 cm left upper lobe mass maximum SUV 14.9. This abuts the pleural margin but  does not appreciably invade the chest wall. AP window lymph node measuring 1.0 cm in short axis on image 83/3, maximum SUV 3.4. Incidental CT findings: Atherosclerotic aortic arch. ABDOMEN/PELVIS: Focal activity along the cecum/ileocecal valve, maximum SUV 11.7, no definite CT correlate. Incidental CT findings: Gastric bypass. Rectus diastasis with small herniation of adipose tissue and loops of bowel. Sigmoid colon diverticulosis. 4.6 by 3.3 cm densely calcified presumed uterine fundal fibroid. Scattered diverticula in the ascending colon. Aortoiliac atherosclerotic vascular disease. SKELETON: Small focus of right antecubital activity is felt to be injection related. No focal skeletal lesion is identified. Incidental CT findings: Right total hip prosthesis. Degenerative facet arthropathy in the lower lumbar spine. Left total shoulder prosthesis. Severe degenerative arthropathy of the right glenohumeral joint. Grade 1 degenerative anterolisthesis at L4-5. IMPRESSION: 1. 7.3 cm left upper lobe mass, maximum SUV 14.9, compatible with  malignancy. 2. 1.0 cm in short axis AP window lymph node has a maximum SUV of 3.4, above mediastinal blood pool activity in just below liver activity, concerning for early metastatic disease. 3. Focus of accentuated metabolic activity in the cecum, maximum SUV 11.7, but without definite CT correlate. This could be from an occult polyp or occult lesion, but could also be physiologic. Correlate with the patient's colon screening history in determining appropriateness of further workup such as colonoscopy. 4. Other imaging findings of potential clinical significance: Aortic Atherosclerosis (ICD10-I70.0). Gastric bypass. Rectus diastasis with herniation of adipose tissue and loops of small bowel. Calcified uterine fibroid. Colonic diverticulosis. Electronically Signed   By: Van Clines M.D.   On: 12/17/2018 10:56      IMPRESSION: Clinical stage IIIb (T3N2) adenocarcinoma the left lung  Patient will be a good candidate for a definitive course of radiation therapy directed at the chest along with radiosensitizing chemotherapy.  Patient's brain MRI is most consistent with meningioma but  will have her case presented at the multidisciplinary brain tumor conference early next week for further input and management.  Today, I talked to the patient  about the findings and work-up thus far.  We discussed the natural history of lung cancer and general treatment, highlighting the role of radiotherapy in the management.  We discussed the available radiation techniques, and focused on the details of logistics and delivery.  We reviewed the anticipated acute and late sequelae associated with radiation in this setting.  The patient was encouraged to ask questions that I answered to the best of my ability.  A patient consent form was discussed and signed.  We retained a copy for our records.  The patient would like to proceed with radiation and will be scheduled for CT simulation.  PLAN: The patient will return tomorrow  for CT simulation with treatments to begin in approximately a week along with radiosensitizing chemotherapy. Chemotherapy will be administered at the [facility] in [place].  Anticipate 6 weeks of radiation therapy.  Management of the brain lesion is pending input from from the brain and spine conference scheduled for October 12.    ------------------------------------------------  Blair Promise, PhD, MD  This document serves as a record of services personally performed by Gery Pray, MD. It was created on his behalf by Wilburn Mylar, a trained medical scribe. The creation of this record is based on the scribe's personal observations and the provider's statements to them. This document has been checked and approved by the attending provider.

## 2018-12-25 NOTE — Patient Instructions (Signed)
Coronavirus (COVID-19) Are you at risk?  Are you at risk for the Coronavirus (COVID-19)?  To be considered HIGH RISK for Coronavirus (COVID-19), you have to meet the following criteria:  . Traveled to [place], [place], [place], [place] or [place]; or in the [place] to [place], [place], [place], or [place]; and have fever, cough, and shortness of breath within the last 2 weeks of travel OR . Been in close contact with a person diagnosed with COVID-19 within the last 2 weeks and have fever, cough, and shortness of breath . IF YOU DO NOT MEET THESE CRITERIA, YOU ARE CONSIDERED LOW RISK FOR COVID-19.  What to do if you are HIGH RISK for COVID-19?  . If you are having a medical emergency, call 911. . Seek medical care right away. Before you go to a doctor's office, urgent care or emergency department, call ahead and tell them about your recent travel, contact with someone diagnosed with COVID-19, and your symptoms. You should receive instructions from your physician's office regarding next steps of care.  . When you arrive at healthcare provider, tell the healthcare staff immediately you have returned from visiting [place], [place], [place], [place] or [place]; or traveled in the [place] to [place], [place], [place], or [place]; in the last two weeks or you have been in close contact with a person diagnosed with COVID-19 in the last 2 weeks.   . Tell the health care staff about your symptoms: fever, cough and shortness of breath. . After you have been seen by a medical provider, you will be either: o Tested for (COVID-19) and discharged home on quarantine except to seek medical care if symptoms worsen, and asked to  - Stay home and avoid contact with others until you get your results (4-5 days)  - Avoid travel on public transportation if possible (such as bus, train, or airplane) or o Sent to the Emergency Department by EMS for evaluation, COVID-19 testing, and possible  admission depending on your condition and test results.  What to do if you are LOW RISK for COVID-19?  Reduce your risk of any infection by using the same precautions used for avoiding the common cold or flu:  . Wash your hands often with soap and warm water for at least 20 seconds.  If soap and water are not readily available, use an alcohol-based hand sanitizer with at least 60% alcohol.  . If coughing or sneezing, cover your mouth and nose by coughing or sneezing into the elbow areas of your shirt or coat, into a tissue or into your sleeve (not your hands). . Avoid shaking hands with others and consider head nods or verbal greetings only. . Avoid touching your eyes, nose, or mouth with unwashed hands.  . Avoid close contact with people who are sick. . Avoid places or events with large numbers of people in one location, like concerts or sporting events. . Carefully consider travel plans you have or are making. . If you are planning any travel outside or inside the [place], visit the CDC's Travelers' Health webpage for the latest health notices. . If you have some symptoms but not all symptoms, continue to monitor at home and seek medical attention if your symptoms worsen. . If you are having a medical emergency, call 911.   ADDITIONAL HEALTHCARE OPTIONS FOR PATIENTS  [facility] / e-Visit: [email or web address]         [facility]: [phone]  [facility]: [phone]                   [facility]: [phone]   

## 2018-12-26 ENCOUNTER — Other Ambulatory Visit: Payer: Self-pay

## 2018-12-26 ENCOUNTER — Other Ambulatory Visit: Payer: Self-pay | Admitting: Radiology

## 2018-12-26 ENCOUNTER — Other Ambulatory Visit: Payer: Self-pay | Admitting: *Deleted

## 2018-12-26 ENCOUNTER — Ambulatory Visit
Admission: RE | Admit: 2018-12-26 | Discharge: 2018-12-26 | Disposition: A | Discharge status: Home / Self Care | Payer: Medicare Other | Source: Ambulatory Visit | Attending: Radiation Oncology | Admitting: Radiation Oncology

## 2018-12-26 DIAGNOSIS — Z510 Encounter for antineoplastic radiation therapy: Secondary | ICD-10-CM | POA: Diagnosis not present

## 2018-12-26 DIAGNOSIS — C3492 Malignant neoplasm of unspecified part of left bronchus or lung: Secondary | ICD-10-CM | POA: Diagnosis not present

## 2018-12-26 DIAGNOSIS — C3412 Malignant neoplasm of upper lobe, left bronchus or lung: Secondary | ICD-10-CM | POA: Diagnosis not present

## 2018-12-26 NOTE — Progress Notes (Signed)
The proposed treatment discussed in cancer conference 12/26/18 is for discussion purpose only and is not a binding recommendation.  The patient was not physically examined nor present for their treatment options.  Therefore, final treatment plans cannot be decided.

## 2018-12-26 NOTE — Progress Notes (Signed)
  Radiation Oncology         [phone] ________________________________  Name: Denise [name] MRN: [id number]  Date: 12/26/2018  DOB: 06-16-1950  SIMULATION AND TREATMENT PLANNING NOTE    ICD-10-CM   1. Adenocarcinoma of left lung (HCC)  C34.92     DIAGNOSIS:  Clinical stage IIIb (T3N2) adenocarcinoma the left lung  NARRATIVE:  The patient was brought to the [facility].  Identity was confirmed.  All relevant records and images related to the planned course of therapy were reviewed.  The patient freely provided informed written consent to proceed with treatment after reviewing the details related to the planned course of therapy. The consent form was witnessed and verified by the simulation staff.  Then, the patient was set-up in a stable reproducible  supine position for radiation therapy.  CT images were obtained.  Surface markings were placed.  The CT images were loaded into the planning software.  Then the target and avoidance structures were contoured.  Treatment planning then occurred.  The radiation prescription was entered and confirmed.  Then, I designed and supervised the construction of a total of 6 medically necessary complex treatment devices.  I have requested : 3D Simulation  I have requested a DVH of the following structures: heart, lungs, GTV, PTV, spinal cord, esophagus.  I have ordered:dose calc.  PLAN:  The patient will receive 60 Gy in 30 fractions along with chemotherapy.   Special Treatment Procedure Note: The patient will be receiving radiosensitizing chemotherapy. Given the potential of increased toxicities related to combined therapy and the necessity for close monitoring of the patient and blood work, this constitutes a special treatment procedure.  -----------------------------------  Blair Promise, PhD, MD  This document serves as a record of services personally performed by Gery Pray, MD. It was created on his behalf by Wilburn Mylar, a  trained medical scribe. The creation of this record is based on the scribe's personal observations and the provider's statements to them. This document has been checked and approved by the attending provider.

## 2018-12-27 ENCOUNTER — Ambulatory Visit (HOSPITAL_COMMUNITY)
Admission: RE | Admit: 2018-12-27 | Discharge: 2018-12-27 | Disposition: A | Discharge status: Home / Self Care | Payer: Medicare Other | Source: Ambulatory Visit | Attending: Hematology | Admitting: Hematology

## 2018-12-27 ENCOUNTER — Other Ambulatory Visit: Payer: Self-pay

## 2018-12-27 ENCOUNTER — Encounter (HOSPITAL_COMMUNITY): Payer: Self-pay

## 2018-12-27 DIAGNOSIS — K219 Gastro-esophageal reflux disease without esophagitis: Secondary | ICD-10-CM | POA: Diagnosis not present

## 2018-12-27 DIAGNOSIS — Z79899 Other long term (current) drug therapy: Secondary | ICD-10-CM | POA: Insufficient documentation

## 2018-12-27 DIAGNOSIS — E119 Type 2 diabetes mellitus without complications: Secondary | ICD-10-CM | POA: Diagnosis not present

## 2018-12-27 DIAGNOSIS — I10 Essential (primary) hypertension: Secondary | ICD-10-CM | POA: Insufficient documentation

## 2018-12-27 DIAGNOSIS — M1990 Unspecified osteoarthritis, unspecified site: Secondary | ICD-10-CM | POA: Diagnosis not present

## 2018-12-27 DIAGNOSIS — C3412 Malignant neoplasm of upper lobe, left bronchus or lung: Secondary | ICD-10-CM | POA: Diagnosis not present

## 2018-12-27 DIAGNOSIS — Z5111 Encounter for antineoplastic chemotherapy: Secondary | ICD-10-CM | POA: Diagnosis not present

## 2018-12-27 DIAGNOSIS — C3492 Malignant neoplasm of unspecified part of left bronchus or lung: Secondary | ICD-10-CM

## 2018-12-27 HISTORY — PX: IR IMAGING GUIDED PORT INSERTION: IMG5740

## 2018-12-27 LAB — CBC WITH DIFFERENTIAL/PLATELET
Abs Immature Granulocytes: 0.01 10*3/uL (ref 0.00–0.07)
Basophils Absolute: 0.1 10*3/uL (ref 0.0–0.1)
Basophils Relative: 1 %
Eosinophils Absolute: 0.1 10*3/uL (ref 0.0–0.5)
Eosinophils Relative: 2 %
HCT: 36.8 % (ref 36.0–46.0)
Hemoglobin: 10.8 g/dL — ABNORMAL LOW (ref 12.0–15.0)
Immature Granulocytes: 0 %
Lymphocytes Relative: 24 %
Lymphs Abs: 1.7 10*3/uL (ref 0.7–4.0)
MCH: 24.7 pg — ABNORMAL LOW (ref 26.0–34.0)
MCHC: 29.3 g/dL — ABNORMAL LOW (ref 30.0–36.0)
MCV: 84 fL (ref 80.0–100.0)
Monocytes Absolute: 0.6 10*3/uL (ref 0.1–1.0)
Monocytes Relative: 8 %
Neutro Abs: 4.8 10*3/uL (ref 1.7–7.7)
Neutrophils Relative %: 65 %
Platelets: 468 10*3/uL — ABNORMAL HIGH (ref 150–400)
RBC: 4.38 MIL/uL (ref 3.87–5.11)
RDW: 15.7 % — ABNORMAL HIGH (ref 11.5–15.5)
WBC: 7.2 10*3/uL (ref 4.0–10.5)
nRBC: 0 % (ref 0.0–0.2)

## 2018-12-27 LAB — PROTIME-INR
INR: 1 (ref 0.8–1.2)
Prothrombin Time: 13.2 seconds (ref 11.4–15.2)

## 2018-12-27 IMAGING — US IR IMAGING GUIDED PORT INSERTION
1 series · 1 of 1 positions shown · non-contrast
Comparison: none

INDICATION: 68-year-old female with left upper lobe bronchogenic adenocarcinoma
in need of durable venous access for chemotherapy.

[Series 1: ir fluoro/shunt/fist · 1 of 1 slices shown]
[im 1/1]
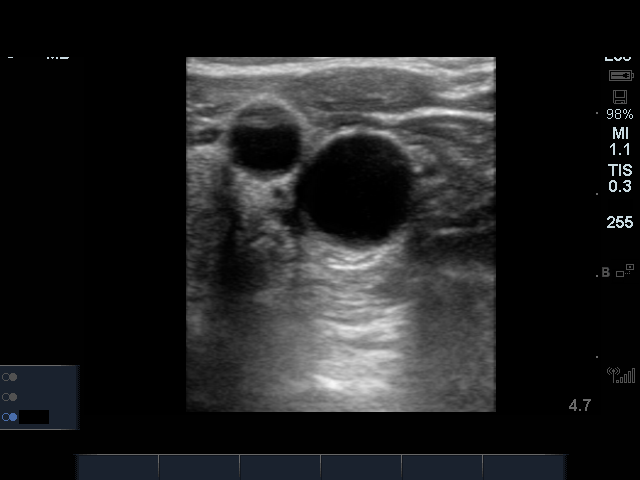

[1 of 1 positions shown; findings below may reference images not displayed]

EXAM:
IMPLANTED PORT A CATH PLACEMENT WITH ULTRASOUND AND FLUOROSCOPIC
GUIDANCE

MEDICATIONS:
2 g Ancef; The antibiotic was administered within an appropriate
time interval prior to skin puncture.

ANESTHESIA/SEDATION:
Versed 4 mg IV; Fentanyl 100 mcg IV;

Moderate Sedation Time:  19 minutes

The patient was continuously monitored during the procedure by the
interventional radiology nurse under my direct supervision.

FLUOROSCOPY TIME:  0 minutes, 12 seconds (12 mGy)

COMPLICATIONS:
None immediate.

PROCEDURE:
The right neck and chest was prepped with chlorhexidine, and draped
in the usual sterile fashion using maximum barrier technique (cap
and mask, sterile gown, sterile gloves, large sterile sheet, hand
hygiene and cutaneous antiseptic). Local anesthesia was attained by
infiltration with 1% lidocaine with epinephrine.

Ultrasound demonstrated patency of the right internal jugular vein,
and this was documented with an image. Under real-time ultrasound
guidance, this vein was accessed with a 21 gauge micropuncture
needle and image documentation was performed. A small dermatotomy
was made at the access site with an 11 scalpel. A 0.018" wire was
advanced into the SVC and the access needle exchanged for a 4F
micropuncture vascular sheath. The 0.018" wire was then removed and
a 0.035" wire advanced into the IVC.



The venous access site was then serially dilated and a peel away
vascular sheath placed over the wire. The wire was removed and the
port catheter advanced into position under fluoroscopic guidance.
The catheter tip is positioned in the superior cavoatrial junction.
This was documented with a spot image. The portacatheter was then
tested and found to flush and aspirate well. The port was flushed
with saline followed by 100 units/mL heparinized saline.

The pocket was then closed in two layers using first subdermal
inverted interrupted absorbable sutures followed by a running
subcuticular suture. The epidermis was then sealed with Dermabond.
The dermatotomy at the venous access site was also closed with
Dermabond.
IMPRESSION: Successful placement of a right IJ approach Power Port with
ultrasound and fluoroscopic guidance. The catheter is ready for use.

## 2018-12-27 IMAGING — XA IR IMAGING GUIDED PORT INSERTION
1 series · 1 of 1 positions shown · non-contrast
Comparison: none

INDICATION: 68-year-old female with left upper lobe bronchogenic adenocarcinoma
in need of durable venous access for chemotherapy.

[Series 1: care single · 1 of 1 slices shown]
[im 1/1]
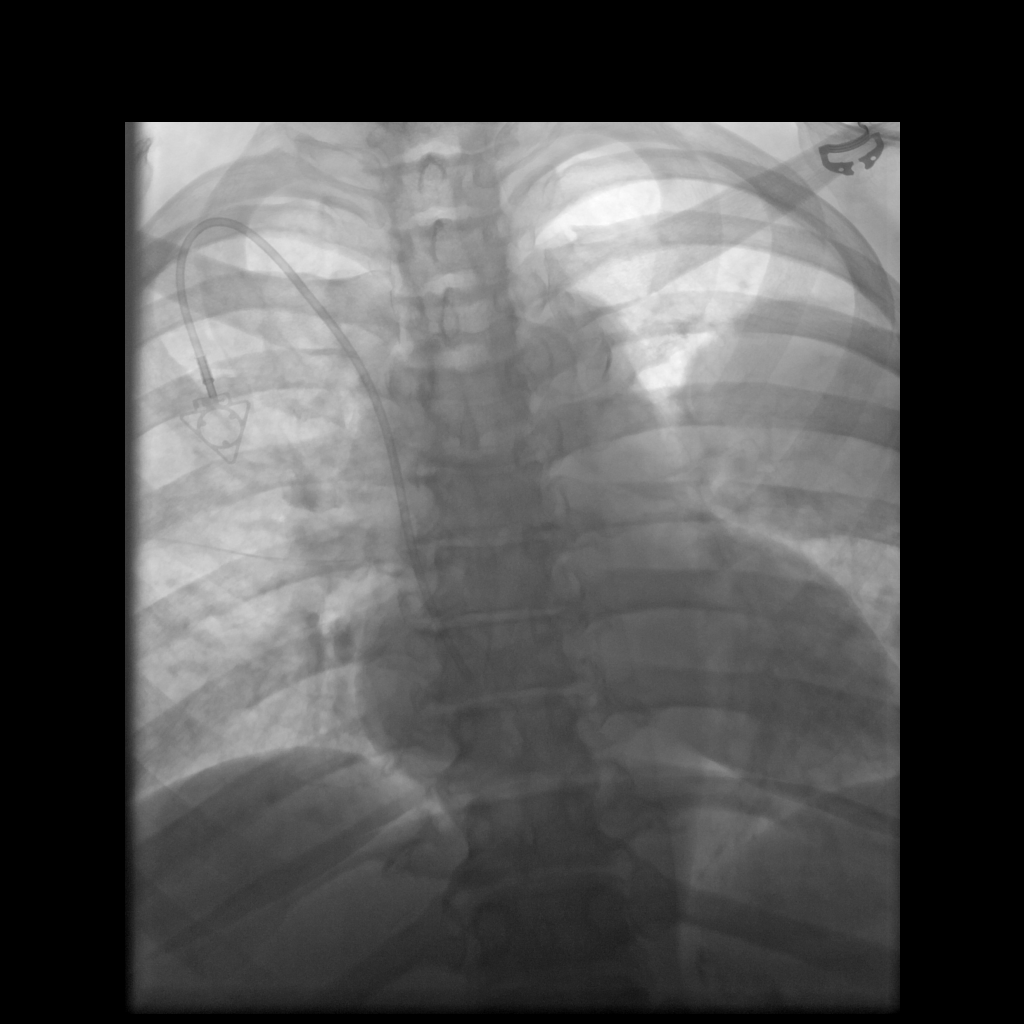

[1 of 1 positions shown; findings below may reference images not displayed]

EXAM:
IMPLANTED PORT A CATH PLACEMENT WITH ULTRASOUND AND FLUOROSCOPIC
GUIDANCE

MEDICATIONS:
2 g Ancef; The antibiotic was administered within an appropriate
time interval prior to skin puncture.

ANESTHESIA/SEDATION:
Versed 4 mg IV; Fentanyl 100 mcg IV;

Moderate Sedation Time:  19 minutes

The patient was continuously monitored during the procedure by the
interventional radiology nurse under my direct supervision.

FLUOROSCOPY TIME:  0 minutes, 12 seconds (12 mGy)

COMPLICATIONS:
None immediate.

PROCEDURE:
The right neck and chest was prepped with chlorhexidine, and draped
in the usual sterile fashion using maximum barrier technique (cap
and mask, sterile gown, sterile gloves, large sterile sheet, hand
hygiene and cutaneous antiseptic). Local anesthesia was attained by
infiltration with 1% lidocaine with epinephrine.

Ultrasound demonstrated patency of the right internal jugular vein,
and this was documented with an image. Under real-time ultrasound
guidance, this vein was accessed with a 21 gauge micropuncture
needle and image documentation was performed. A small dermatotomy
was made at the access site with an 11 scalpel. A 0.018" wire was
advanced into the SVC and the access needle exchanged for a 4F
micropuncture vascular sheath. The 0.018" wire was then removed and
a 0.035" wire advanced into the IVC.



The venous access site was then serially dilated and a peel away
vascular sheath placed over the wire. The wire was removed and the
port catheter advanced into position under fluoroscopic guidance.
The catheter tip is positioned in the superior cavoatrial junction.
This was documented with a spot image. The portacatheter was then
tested and found to flush and aspirate well. The port was flushed
with saline followed by 100 units/mL heparinized saline.

The pocket was then closed in two layers using first subdermal
inverted interrupted absorbable sutures followed by a running
subcuticular suture. The epidermis was then sealed with Dermabond.
The dermatotomy at the venous access site was also closed with
Dermabond.
IMPRESSION: Successful placement of a right IJ approach Power Port with
ultrasound and fluoroscopic guidance. The catheter is ready for use.

## 2018-12-27 MED ORDER — HEPARIN SOD (PORK) LOCK FLUSH 100 UNIT/ML IV SOLN
INTRAVENOUS | Status: AC
  Filled 2018-12-27: qty 5

## 2018-12-27 MED ORDER — SODIUM CHLORIDE 0.9 % IV SOLN
INTRAVENOUS | Status: DC
  Administered 2018-12-27: 14:00:00 via INTRAVENOUS

## 2018-12-27 MED ORDER — FENTANYL CITRATE (PF) 100 MCG/2ML IJ SOLN
INTRAMUSCULAR | Status: AC
  Filled 2018-12-27: qty 2

## 2018-12-27 MED ORDER — CEFAZOLIN SODIUM-DEXTROSE 2-4 GM/100ML-% IV SOLN
2.0000 g | INTRAVENOUS | Status: AC
  Administered 2018-12-27: 15:00:00 2 g via INTRAVENOUS

## 2018-12-27 MED ORDER — LIDOCAINE-EPINEPHRINE (PF) 1 %-1:200000 IJ SOLN
INTRAMUSCULAR | Status: AC | PRN
  Administered 2018-12-27 (×2): 10 mL

## 2018-12-27 MED ORDER — MIDAZOLAM HCL 2 MG/2ML IJ SOLN
INTRAMUSCULAR | Status: AC | PRN
  Administered 2018-12-27 (×4): 1 mg via INTRAVENOUS

## 2018-12-27 MED ORDER — CEFAZOLIN SODIUM-DEXTROSE 2-4 GM/100ML-% IV SOLN
INTRAVENOUS | Status: AC
  Administered 2018-12-27: 2 g via INTRAVENOUS
  Filled 2018-12-27: qty 100

## 2018-12-27 MED ORDER — LIDOCAINE-EPINEPHRINE (PF) 2 %-1:200000 IJ SOLN
INTRAMUSCULAR | Status: AC
  Filled 2018-12-27: qty 20

## 2018-12-27 MED ORDER — HEPARIN SOD (PORK) LOCK FLUSH 100 UNIT/ML IV SOLN
INTRAVENOUS | Status: AC | PRN
  Administered 2018-12-27: 500 [IU] via INTRAVENOUS

## 2018-12-27 MED ORDER — FENTANYL CITRATE (PF) 100 MCG/2ML IJ SOLN
INTRAMUSCULAR | Status: AC | PRN
  Administered 2018-12-27 (×2): 50 ug via INTRAVENOUS

## 2018-12-27 MED ORDER — MIDAZOLAM HCL 2 MG/2ML IJ SOLN
INTRAMUSCULAR | Status: AC
  Filled 2018-12-27: qty 4

## 2018-12-27 NOTE — Discharge Instructions (Signed)
Please contact IR clinic [phone] or after hours at [phone] and ask for the IR doctor on call with any questions or concerns.    DO NOT use EMLA crema for 2 weeks after port placement as this cream will remove surgical glue on your incision.  You may remove your dressing and shower tomorrow.  Implanted Port Insertion, Care After This sheet gives you information about how to care for yourself after your procedure. Your health care provider may also give you more specific instructions. If you have problems or questions, contact your health care provider. What can I expect after the procedure? After the procedure, it is common to have:  Discomfort at the port insertion site.  Bruising on the skin over the port. This should improve over 3-4 days. Follow these instructions at home: [facility] care  After your port is placed, you will get a manufacturer's information card. The card has information about your port. Keep this card with you at all times.  Take care of the port as told by your health care provider. Ask your health care provider if you or a family member can get training for taking care of the port at home. A home health care nurse may also take care of the port.  Make sure to remember what type of port you have. Incision care      Follow instructions from your health care provider about how to take care of your port insertion site. Make sure you: ? Wash your hands with soap and water before and after you change your bandage (dressing). If soap and water are not available, use hand sanitizer. ? Change your dressing as told by your health care provider. ? Leave stitches (sutures), skin glue, or adhesive strips in place. These skin closures may need to stay in place for 2 weeks or longer. If adhesive strip edges start to loosen and curl up, you may trim the loose edges. Do not remove adhesive strips completely unless your health care provider tells you to do that.  Check your  port insertion site every day for signs of infection. Check for: ? Redness, swelling, or pain. ? Fluid or blood. ? Warmth. ? Pus or a bad smell. Activity  Return to your normal activities as told by your health care provider. Ask your health care provider what activities are safe for you.  Do not lift anything that is heavier than 10 lb (4.5 kg), or the limit that you are told, until your health care provider says that it is safe. General instructions  Take over-the-counter and prescription medicines only as told by your health care provider.  Do not take baths, swim, or use a hot tub until your health care provider approves. Ask your health care provider if you may take showers. You may only be allowed to take sponge baths.  Do not drive for 24 hours if you were given a sedative during your procedure.  Wear a medical alert bracelet in case of an emergency. This will tell any health care providers that you have a port.  Keep all follow-up visits as told by your health care provider. This is important. Contact a health care provider if:  You cannot flush your port with saline as directed, or you cannot draw blood from the port.  You have a fever or chills.  You have redness, swelling, or pain around your port insertion site.  You have fluid or blood coming from your port insertion site.  Your port insertion  site feels warm to the touch.  You have pus or a bad smell coming from the port insertion site. Get help right away if:  You have chest pain or shortness of breath.  You have bleeding from your port that you cannot control. Summary  Take care of the port as told by your health care provider. Keep the manufacturer's information card with you at all times.  Change your dressing as told by your health care provider.  Contact a health care provider if you have a fever or chills or if you have redness, swelling, or pain around your port insertion site.  Keep all follow-up  visits as told by your health care provider. This information is not intended to replace advice given to you by your health care provider. Make sure you discuss any questions you have with your health care provider. Document Released: 12/25/2012 Document Revised: 10/02/2017 Document Reviewed: 10/02/2017 Elsevier Patient Education  [place].   Moderate Conscious Sedation, Adult, Care After These instructions provide you with information about caring for yourself after your procedure. Your health care provider may also give you more specific instructions. Your treatment has been planned according to current medical practices, but problems sometimes occur. Call your health care provider if you have any problems or questions after your procedure. What can I expect after the procedure? After your procedure, it is common:  To feel sleepy for several hours.  To feel clumsy and have poor balance for several hours.  To have poor judgment for several hours.  To vomit if you eat too soon. Follow these instructions at home: For at least 24 hours after the procedure:   Do not: ? Participate in activities where you could fall or become injured. ? Drive. ? Use heavy machinery. ? Drink alcohol. ? Take sleeping pills or medicines that cause drowsiness. ? Make important decisions or sign legal documents. ? Take care of children on your own.  Rest. Eating and drinking  Follow the diet recommended by your health care provider.  If you vomit: ? Drink water, juice, or soup when you can drink without vomiting. ? Make sure you have little or no nausea before eating solid foods. General instructions  Have a responsible adult stay with you until you are awake and alert.  Take over-the-counter and prescription medicines only as told by your health care provider.  If you smoke, do not smoke without supervision.  Keep all follow-up visits as told by your health care provider. This is  important. Contact a health care provider if:  You keep feeling nauseous or you keep vomiting.  You feel light-headed.  You develop a rash.  You have a fever. Get help right away if:  You have trouble breathing. This information is not intended to replace advice given to you by your health care provider. Make sure you discuss any questions you have with your health care provider. Document Released: 12/25/2012 Document Revised: 02/16/2017 Document Reviewed: 06/26/2015 Elsevier Patient Education  2020 [employer].

## 2018-12-27 NOTE — Procedures (Signed)
Interventional Radiology Procedure Note  Procedure: Placement of a right IJ approach single lumen PowerPort.  Tip is positioned at the superior cavoatrial junction and catheter is ready for immediate use.  Complications: No immediate Recommendations:  - Ok to shower tomorrow - Do not submerge for 7 days - Routine line care   Signed,  Heath K. McCullough, MD   

## 2018-12-27 NOTE — H&P (Signed)
Referring Physician(s): Nester,Kim R/Katragadda,S  Supervising Physician: Jacqulynn Cadet  Patient Status:  Denise [name] OP  Chief Complaint:  "I'm here for a port a cath"  Subjective: Patient familiar to IR service from left lung mass biopsy on 11/19/2018 yielding adenocarcinoma.  She presents again today for Port-A-Cath placement for chemotherapy.  She denies fever, headache, chest pain, abdominal pain, nausea, vomiting.  She does have some dyspnea with exertion, back pain and occasional blood-tinged sputum.  Past Medical History:  Diagnosis Date  . Anemia   . Arthritis   . Diabetes mellitus    diet controlled  . GERD (gastroesophageal reflux disease)   . HTN (hypertension)   . HTN (hypertension)   . Shortness of breath dyspnea    with exertion   Past Surgical History:  Procedure Laterality Date  . CHOLECYSTECTOMY  1997  . COLONOSCOPY    . GASTRIC BYPASS    . INCISIONAL HERNIA REPAIR  04/11/11  . LAPAROSCOPIC SALPINGOOPHERECTOMY    . LAPAROTOMY  04/11/2011   Procedure: EXPLORATORY LAPAROTOMY;  Surgeon: Joyice Faster. Cornett, MD;  Location: [facility];  Service: General;  Laterality: N/A;  closure port hole  . TOTAL HIP ARTHROPLASTY  03/07/2012   Procedure: TOTAL HIP ARTHROPLASTY ANTERIOR APPROACH;  Surgeon: Mauri Pole, MD;  Location: [facility];  Service: Orthopedics;  Laterality: Right;  . TOTAL SHOULDER ARTHROPLASTY Left 01/21/2015  . TOTAL SHOULDER ARTHROPLASTY Left 01/21/2015   Procedure: LEFT TOTAL SHOULDER ARTHROPLASTY;  Surgeon: Justice Britain, MD;  Location: [place];  Service: Orthopedics;  Laterality: Left;  Marland Kitchen VAGINAL HYSTERECTOMY        Allergies: Patient has no known allergies.  Medications: Prior to Admission medications   Medication Sig Start Date End Date Taking? Authorizing Provider  amLODipine (NORVASC) 5 MG tablet Take 5 mg by mouth every morning.    Yes [provider]  Calcium Carbonate Antacid (TUMS PO) Take 4 tablets by mouth daily.    Yes [provider]  Cyanocobalamin (VITAMIN B-12) 2500 MCG SUBL Place 2,500 mcg under the tongue every morning.    Yes [provider]  ferrous sulfate 325 (65 FE) MG tablet Take 1 tablet (325 mg total) by mouth 3 (three) times daily after meals. Patient taking differently: Take 325 mg by mouth daily.  03/08/12  Yes Babish, Rodman Key, PA-C  ibuprofen (ADVIL,MOTRIN) 800 MG tablet Take 1 tablet (800 mg total) by mouth 3 (three) times daily. Patient taking differently: Take 800 mg by mouth every 8 (eight) hours as needed.  01/22/15  Yes Shuford, Olivia Mackie, PA-C  Multiple Vitamin (MULITIVITAMIN WITH MINERALS) TABS Take 1 tablet by mouth every morning.    Yes [provider]  omeprazole (PRILOSEC) 20 MG capsule Take 20 mg by mouth daily. 09/28/18  Yes [provider]  gabapentin (NEURONTIN) 300 MG capsule Take 300 mg by mouth at bedtime as needed for pain. 08/07/18   [provider]     Vital Signs: BP (!) 149/84   Pulse 94   Temp 98.6 F (37 C) (Oral)   Resp 18   SpO2 100%   Physical Exam awake, alert.  Chest clear to auscultation bilaterally.  Heart with regular rate and rhythm, soft murmur.  Abdomen obese, soft, positive bowel sounds, nontender.  Bilateral lower extremity edema noted.  Imaging: No results found.  Labs:  CBC: Recent Labs    11/19/18 0920 12/09/18 1449  WBC 7.9 8.1  HGB 11.2* 11.7*  HCT 36.9 39.6  PLT 442* 493*  COAGS: Recent Labs    11/19/18 0920  INR 1.1    BMP: Recent Labs    12/09/18 1449  NA 143  K 4.1  CL 106  CO2 28  GLUCOSE 118*  BUN 11  CALCIUM 9.2  CREATININE 0.64  GFRNONAA >60  GFRAA >60    LIVER FUNCTION TESTS: Recent Labs    12/09/18 1449  BILITOT <0.1*  AST 22  ALT 16  ALKPHOS 121  PROT 7.3  ALBUMIN 3.6    Assessment and Plan: Patient with history of newly diagnosed left lung adenocarcinoma; presents today for Port-A-Cath placement for chemotherapy.Risks and benefits of image guided  port-a-catheter placement was discussed with the patient including, but not limited to bleeding, infection, pneumothorax, or fibrin sheath development and need for additional procedures.  All of the patient's questions were answered, patient is agreeable to proceed. Consent signed and in chart.  LABS PENDING  Electronically Signed: D. Rowe Robert, PA-C 12/27/2018, 1:43 PM   I spent a total of  at the the patient's bedside AND on the patient's hospital floor or unit, greater than 50% of which was counseling/coordinating care for Port-A-Cath placement

## 2018-12-30 DIAGNOSIS — C3412 Malignant neoplasm of upper lobe, left bronchus or lung: Secondary | ICD-10-CM | POA: Diagnosis not present

## 2018-12-30 DIAGNOSIS — Z510 Encounter for antineoplastic radiation therapy: Secondary | ICD-10-CM | POA: Diagnosis not present

## 2018-12-30 DIAGNOSIS — C3492 Malignant neoplasm of unspecified part of left bronchus or lung: Secondary | ICD-10-CM | POA: Diagnosis not present

## 2018-12-31 ENCOUNTER — Other Ambulatory Visit (HOSPITAL_COMMUNITY): Payer: Self-pay

## 2018-12-31 DIAGNOSIS — C3492 Malignant neoplasm of unspecified part of left bronchus or lung: Secondary | ICD-10-CM

## 2019-01-01 ENCOUNTER — Other Ambulatory Visit: Payer: Self-pay

## 2019-01-01 ENCOUNTER — Inpatient Hospital Stay (HOSPITAL_COMMUNITY): Payer: Medicare Other | Attending: Hematology | Admitting: Hematology

## 2019-01-01 ENCOUNTER — Encounter (HOSPITAL_COMMUNITY): Payer: Self-pay | Admitting: Hematology

## 2019-01-01 ENCOUNTER — Inpatient Hospital Stay (HOSPITAL_COMMUNITY): Payer: Medicare Other

## 2019-01-01 DIAGNOSIS — C3492 Malignant neoplasm of unspecified part of left bronchus or lung: Secondary | ICD-10-CM

## 2019-01-01 DIAGNOSIS — R110 Nausea: Secondary | ICD-10-CM | POA: Insufficient documentation

## 2019-01-01 DIAGNOSIS — C3412 Malignant neoplasm of upper lobe, left bronchus or lung: Secondary | ICD-10-CM | POA: Insufficient documentation

## 2019-01-01 DIAGNOSIS — Z5111 Encounter for antineoplastic chemotherapy: Secondary | ICD-10-CM | POA: Insufficient documentation

## 2019-01-01 LAB — CBC WITH DIFFERENTIAL/PLATELET
Abs Immature Granulocytes: 0.02 10*3/uL (ref 0.00–0.07)
Basophils Absolute: 0.1 10*3/uL (ref 0.0–0.1)
Basophils Relative: 1 %
Eosinophils Absolute: 0.1 10*3/uL (ref 0.0–0.5)
Eosinophils Relative: 2 %
HCT: 37.9 % (ref 36.0–46.0)
Hemoglobin: 11 g/dL — ABNORMAL LOW (ref 12.0–15.0)
Immature Granulocytes: 0 %
Lymphocytes Relative: 25 %
Lymphs Abs: 1.7 10*3/uL (ref 0.7–4.0)
MCH: 24.6 pg — ABNORMAL LOW (ref 26.0–34.0)
MCHC: 29 g/dL — ABNORMAL LOW (ref 30.0–36.0)
MCV: 84.6 fL (ref 80.0–100.0)
Monocytes Absolute: 0.5 10*3/uL (ref 0.1–1.0)
Monocytes Relative: 8 %
Neutro Abs: 4.5 10*3/uL (ref 1.7–7.7)
Neutrophils Relative %: 64 %
Platelets: 466 10*3/uL — ABNORMAL HIGH (ref 150–400)
RBC: 4.48 MIL/uL (ref 3.87–5.11)
RDW: 15.7 % — ABNORMAL HIGH (ref 11.5–15.5)
WBC: 6.9 10*3/uL (ref 4.0–10.5)
nRBC: 0 % (ref 0.0–0.2)

## 2019-01-01 LAB — COMPREHENSIVE METABOLIC PANEL
ALT: 7 U/L (ref 0–44)
AST: 16 U/L (ref 15–41)
Albumin: 3.2 g/dL — ABNORMAL LOW (ref 3.5–5.0)
Alkaline Phosphatase: 101 U/L (ref 38–126)
Anion gap: 8 (ref 5–15)
BUN: 10 mg/dL (ref 8–23)
CO2: 28 mmol/L (ref 22–32)
Calcium: 8.8 mg/dL — ABNORMAL LOW (ref 8.9–10.3)
Chloride: 105 mmol/L (ref 98–111)
Creatinine, Ser: 0.64 mg/dL (ref 0.44–1.00)
GFR calc Af Amer: >60 mL/min (ref >60)
GFR calc non Af Amer: >60 mL/min (ref >60)
Glucose, Bld: 104 mg/dL — ABNORMAL HIGH (ref 70–99)
Potassium: 4 mmol/L (ref 3.5–5.1)
Sodium: 141 mmol/L (ref 135–145)
Total Bilirubin: 0.3 mg/dL (ref 0.3–1.2)
Total Protein: 7.2 g/dL (ref 6.5–8.1)

## 2019-01-01 NOTE — Patient Instructions (Addendum)
[place] at [facility] Discharge Instructions  You were seen today by Dr. Delton Coombes. He went over your recent test results. He will schedule you for your chemotherapy. You will be treated with Carboplatin and Taxol weekly. He discussed your treatment and it's possible side effects. He also went over how long your treatment will last as well as how many treatments you will receive. He will see you back in 1 week for labs, treatment and follow up.   Thank you for choosing [place] at [facility] to provide your oncology and hematology care.  To afford each patient quality time with our provider, please arrive at least 15 minutes before your scheduled appointment time.   If you have a lab appointment with the [place] please come in thru the  Main Entrance and check in at the main information desk  You need to re-schedule your appointment should you arrive 10 or more minutes late.  We strive to give you quality time with our providers, and arriving late affects you and other patients whose appointments are after yours.  Also, if you no show three or more times for appointments you may be dismissed from the clinic at the providers discretion.     Again, thank you for choosing [facility].  Our hope is that these requests will decrease the amount of time that you wait before being seen by our physicians.       _____________________________________________________________  Should you have questions after your visit to [facility], please contact our office at [phone] between the hours of 8:00 a.m. and 4:30 p.m.  Voicemails left after 4:00 p.m. will not be returned until the following business day.  For prescription refill requests, have your pharmacy contact our office and allow 72 hours.    Cancer Center Support Programs:   > Cancer Support Group  2nd Tuesday of the month 1pm-2pm, Journey Room

## 2019-01-01 NOTE — Progress Notes (Signed)
[place] [place], [place] [address]   CLINIC:  Medical Oncology/Hematology  PCP:  Iona Beard, MD [address] [place] [address] [phone]   REASON FOR VISIT:  Stage III left lung adenocarcinoma.  CURRENT THERAPY: Weekly carbotaxol with XRT.    INTERVAL HISTORY:  [name] 68 y.o. female seen for follow-up of her lung cancer.  She reports that her left hemifacial pain and jaw pain has resolved completely.  No recent episodes of hemoptysis.  She feels short of breath on exertion.  She had port placed by interventional radiology.  Appetite is 100%.  Energy levels are 75%.  Pain is reported as 0.  Leg swellings have been stable.  Denies any new onset pains.  No headaches or vision changes reported.  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    REVIEW OF SYSTEMS:  Review of Systems  Respiratory: Positive for shortness of breath.   Cardiovascular: Positive for leg swelling.  All other systems reviewed and are negative.    PAST MEDICAL/SURGICAL HISTORY:  Past Medical History:  Diagnosis Date  . Anemia   . Arthritis   . Diabetes mellitus    diet controlled  . GERD (gastroesophageal reflux disease)   . HTN (hypertension)   . HTN (hypertension)   . Shortness of breath dyspnea    with exertion   Past Surgical History:  Procedure Laterality Date  . CHOLECYSTECTOMY  1997  . COLONOSCOPY    . GASTRIC BYPASS    . INCISIONAL HERNIA REPAIR  04/11/11  . IR IMAGING GUIDED PORT INSERTION  12/27/2018  . LAPAROSCOPIC SALPINGOOPHERECTOMY    . LAPAROTOMY  04/11/2011   Procedure: EXPLORATORY LAPAROTOMY;  Surgeon: Joyice Faster. Cornett, MD;  Location: [facility];  Service: General;  Laterality: N/A;  closure port hole  . TOTAL HIP ARTHROPLASTY  03/07/2012   Procedure: TOTAL HIP ARTHROPLASTY ANTERIOR APPROACH;  Surgeon: Mauri Pole, MD;  Location: [facility];  Service: Orthopedics;  Laterality: Right;  . TOTAL SHOULDER ARTHROPLASTY  Left 01/21/2015  . TOTAL SHOULDER ARTHROPLASTY Left 01/21/2015   Procedure: LEFT TOTAL SHOULDER ARTHROPLASTY;  Surgeon: Justice Britain, MD;  Location: [facility];  Service: Orthopedics;  Laterality: Left;  Marland Kitchen VAGINAL HYSTERECTOMY       SOCIAL HISTORY:  Social History   Socioeconomic History  . Marital status: Single    Spouse name: Not on file  . Number of children: Not on file  . Years of education: 12th grade  . Highest education level: Not on file  Occupational History  . Occupation: Employed    Employer: [employer]  . Financial resource strain: Not very hard  . Food insecurity    Worry: Never true    Inability: Never true  . Transportation needs    Medical: No    Non-medical: No  Tobacco Use  . Smoking status: Former Smoker    Packs/day: 1.00    Years: 12.00    Pack years: 12.00    Types: Cigarettes    Quit date: 03/20/1976    Years since quitting: 42.8  . Smokeless tobacco: Never Used  Substance and Sexual Activity  . Alcohol use: No  . Drug use: No  . Sexual activity: Never  Lifestyle  . Physical activity    Days per week: 0 days    Minutes per session: 0 min  . Stress: Only a little  Relationships  . Social connections  Talks on phone: More than three times a week    Gets together: Once a week    Attends religious service: More than 4 times per year    Active member of club or organization: No    Attends meetings of clubs or organizations: Never    Relationship status: Never married  . Intimate partner violence    Fear of current or ex partner: No    Emotionally abused: No    Physically abused: No    Forced sexual activity: No  Other Topics Concern  . Not on file  Social History Narrative  . Not on file    FAMILY HISTORY:  Family History  Problem Relation Age of Onset  . Breast cancer Mother   . COPD Mother   . Arthritis Mother   . Diabetes Mother   . Hypertension Mother   . Hypertension Father   . Diabetes Father   .  Breast cancer Sister   . Thyroid cancer Brother   . Huntington's disease Maternal Grandmother   . Heart attack Brother     CURRENT MEDICATIONS:  Outpatient Encounter Medications as of 01/01/2019  Medication Sig  . amLODipine (NORVASC) 5 MG tablet Take 5 mg by mouth every morning.   . Calcium Carbonate Antacid (TUMS PO) Take 4 tablets by mouth daily.   . Cyanocobalamin (VITAMIN B-12) 2500 MCG SUBL Place 2,500 mcg under the tongue every morning.   . ferrous sulfate 325 (65 FE) MG tablet Take 1 tablet (325 mg total) by mouth 3 (three) times daily after meals. (Patient taking differently: Take 325 mg by mouth daily. )  . gabapentin (NEURONTIN) 300 MG capsule Take 300 mg by mouth at bedtime as needed for pain.  Marland Kitchen ibuprofen (ADVIL,MOTRIN) 800 MG tablet Take 1 tablet (800 mg total) by mouth 3 (three) times daily. (Patient taking differently: Take 800 mg by mouth every 8 (eight) hours as needed. )  . Multiple Vitamin (MULITIVITAMIN WITH MINERALS) TABS Take 1 tablet by mouth every morning.   Marland Kitchen omeprazole (PRILOSEC) 20 MG capsule Take 20 mg by mouth daily.   No facility-administered encounter medications on file as of 01/01/2019.     ALLERGIES:  No Known Allergies   PHYSICAL EXAM:  ECOG Performance status: 0  Vitals:   01/01/19 1132  BP: 131/86  Pulse: 88  Resp: 20  Temp: 97.7 F (36.5 C)  SpO2: 99%   Filed Weights   01/01/19 1132  Weight: 276 lb 9.6 oz (125.5 kg)    Physical Exam Vitals signs reviewed.  Constitutional:      Appearance: Normal appearance.  Cardiovascular:     Rate and Rhythm: Normal rate and regular rhythm.     Heart sounds: Normal heart sounds.  Pulmonary:     Effort: Pulmonary effort is normal.     Breath sounds: Normal breath sounds.  Abdominal:     General: There is no distension.     Palpations: Abdomen is soft. There is no mass.  Musculoskeletal:        General: Swelling present.  Lymphadenopathy:     Cervical: No cervical adenopathy.  Skin:     General: Skin is warm.  Neurological:     General: No focal deficit present.     Mental Status: She is alert and oriented to person, place, and time.  Psychiatric:        Mood and Affect: Mood normal.        Behavior: Behavior normal.  LABORATORY DATA:  I have reviewed the labs as listed.  CBC    Component Value Date/Time   WBC 6.9 01/01/2019 1113   RBC 4.48 01/01/2019 1113   HGB 11.0 (L) 01/01/2019 1113   HCT 37.9 01/01/2019 1113   PLT 466 (H) 01/01/2019 1113   MCV 84.6 01/01/2019 1113   MCH 24.6 (L) 01/01/2019 1113   MCHC 29.0 (L) 01/01/2019 1113   RDW 15.7 (H) 01/01/2019 1113   LYMPHSABS 1.7 01/01/2019 1113   MONOABS 0.5 01/01/2019 1113   EOSABS 0.1 01/01/2019 1113   BASOSABS 0.1 01/01/2019 1113   CMP Latest Ref Rng & Units 01/01/2019 12/09/2018 01/14/2015  Glucose 70 - 99 mg/dL 104(H) 118(H) 101(H)  BUN 8 - 23 mg/dL 10 11 9   Creatinine 0.44 - 1.00 mg/dL 0.64 0.64 0.72  Sodium 135 - 145 mmol/L 141 143 143  Potassium 3.5 - 5.1 mmol/L 4.0 4.1 3.6  Chloride 98 - 111 mmol/L 105 106 107  CO2 22 - 32 mmol/L 28 28 28   Calcium 8.9 - 10.3 mg/dL 8.8(L) 9.2 9.2  Total Protein 6.5 - 8.1 g/dL 7.2 7.3 6.2(L)  Total Bilirubin 0.3 - 1.2 mg/dL 0.3 <0.1(L) 0.3  Alkaline Phos 38 - 126 U/L 101 121 95  AST 15 - 41 U/L 16 22 16   ALT 0 - 44 U/L 7 16 17        DIAGNOSTIC IMAGING:  I have independently reviewed the scans and discussed with the patient.    ASSESSMENT & PLAN:   Adenocarcinoma of left lung ([facility]) 1.  Clinical stage IIIb (T3N2) adenocarcinoma the left lung: -Presentation with hemoptysis, CT chest on 10/17/2018 showing left upper lobe lung mass, and enlarged AP window lymph node. -CT-guided biopsy on 11/19/2018 consistent with adenocarcinoma. -She smoked 1 pack/day for 15 years, quit 25 years ago. -PET scan on 12/17/2018 showed 7.3 cm left upper lobe mass, 1 cm short axis AP window lymph node with SUV 3.4.  Focus of increased metabolic activity in the cecum without  definite CT correlate.  Patient reportedly had colonoscopy 3 to 4 years ago which was normal. -MRI of the brain on 12/16/2018 showed 1.8 x 2.6 x 2.6 cm enhancing extra-axial mass along the left greater wing of sphenoid.  Mass has imaging appearance of meningioma.  However dural based metastatic lesion cannot be excluded. -She had left-sided facial pain 2 weeks ago, which subsided at this time. -Her case was discussed at both thoracic oncology tumor board and neuro-oncology tumor board. -It was recommended that MRI of the brain be repeated in 2 months.  I agree with the decision as she has no pain in the left jaw. -She was seen by Dr. Sondra Come and will start radiation therapy on 01/03/2019. -I have talked to her about radiosensitizing chemotherapy with carboplatin and paclitaxel given weekly throughout the course of radiation.  This will be followed by rescanning and consolidation with immunotherapy. -We reviewed her labs today.  We discussed the side effects and the regimen of chemotherapy in detail. -She will start first cycle of chemotherapy early next week.      Orders placed this encounter:  No orders of the defined types were placed in this encounter.  Total time spent is 40 minutes with more than 50% of the time spent face-to-face discussing treatment plan, counseling and coordination of care.   Derek Jack, MD [place] [phone]

## 2019-01-02 ENCOUNTER — Other Ambulatory Visit: Payer: Self-pay | Admitting: Radiation Therapy

## 2019-01-02 ENCOUNTER — Encounter (HOSPITAL_COMMUNITY): Payer: Self-pay | Admitting: Hematology

## 2019-01-02 ENCOUNTER — Other Ambulatory Visit: Payer: Self-pay

## 2019-01-02 ENCOUNTER — Ambulatory Visit
Admission: RE | Admit: 2019-01-02 | Discharge: 2019-01-02 | Disposition: A | Discharge status: Home / Self Care | Payer: Medicare Other | Source: Ambulatory Visit | Attending: Radiation Oncology | Admitting: Radiation Oncology

## 2019-01-02 DIAGNOSIS — C3492 Malignant neoplasm of unspecified part of left bronchus or lung: Secondary | ICD-10-CM | POA: Diagnosis not present

## 2019-01-02 DIAGNOSIS — D329 Benign neoplasm of meninges, unspecified: Secondary | ICD-10-CM

## 2019-01-02 DIAGNOSIS — Z510 Encounter for antineoplastic radiation therapy: Secondary | ICD-10-CM | POA: Diagnosis not present

## 2019-01-02 DIAGNOSIS — C3412 Malignant neoplasm of upper lobe, left bronchus or lung: Secondary | ICD-10-CM | POA: Diagnosis not present

## 2019-01-02 NOTE — Progress Notes (Signed)
  Radiation Oncology         [phone]) [phone] ________________________________  Name: Denise [name] MRN: [id number]  Date: 01/02/2019  DOB: 1950-12-28  Simulation Verification Note    ICD-10-CM   1. Adenocarcinoma of left lung (HCC)  C34.92     Status: outpatient  NARRATIVE: The patient was brought to the treatment unit and placed in the planned treatment position. The clinical setup was verified. Then port films were obtained and uploaded to the radiation oncology medical record software.  The treatment beams were carefully compared against the planned radiation fields. The position location and shape of the radiation fields was reviewed. They targeted volume of tissue appears to be appropriately covered by the radiation beams. Organs at risk appear to be excluded as planned.  Based on my personal review, I approved the simulation verification. The patient's treatment will proceed as planned.  -----------------------------------  Blair Promise, PhD, MD

## 2019-01-02 NOTE — Progress Notes (Signed)
START ON PATHWAY REGIMEN - Non-Small Cell Lung     Administer weekly:     Paclitaxel      Carboplatin   **Always confirm dose/schedule in your pharmacy ordering system**  Patient Characteristics: Stage III - Unresectable, PS = 0, 1 AJCC T Category: T3 Current Disease Status: No Distant Mets or Local Recurrence AJCC N Category: N2 AJCC M Category: M0 AJCC 8 Stage Grouping: IIIB ECOG Performance Status: 0 Intent of Therapy: Curative Intent, Discussed with Patient

## 2019-01-02 NOTE — Assessment & Plan Note (Signed)
1.  Clinical stage IIIb (T3N2) adenocarcinoma the left lung: -Presentation with hemoptysis, CT chest on 10/17/2018 showing left upper lobe lung mass, and enlarged AP window lymph node. -CT-guided biopsy on 11/19/2018 consistent with adenocarcinoma. -She smoked 1 pack/day for 15 years, quit 25 years ago. -PET scan on 12/17/2018 showed 7.3 cm left upper lobe mass, 1 cm short axis AP window lymph node with SUV 3.4.  Focus of increased metabolic activity in the cecum without definite CT correlate.  Patient reportedly had colonoscopy 3 to 4 years ago which was normal. -MRI of the brain on 12/16/2018 showed 1.8 x 2.6 x 2.6 cm enhancing extra-axial mass along the left greater wing of sphenoid.  Mass has imaging appearance of meningioma.  However dural based metastatic lesion cannot be excluded. -She had left-sided facial pain 2 weeks ago, which subsided at this time. -Her case was discussed at both thoracic oncology tumor board and neuro-oncology tumor board. -It was recommended that MRI of the brain be repeated in 2 months.  I agree with the decision as she has no pain in the left jaw. -She was seen by Dr. Sondra Come and will start radiation therapy on 01/03/2019. -I have talked to her about radiosensitizing chemotherapy with carboplatin and paclitaxel given weekly throughout the course of radiation.  This will be followed by rescanning and consolidation with immunotherapy. -We reviewed her labs today.  We discussed the side effects and the regimen of chemotherapy in detail. -She will start first cycle of chemotherapy early next week.

## 2019-01-03 ENCOUNTER — Ambulatory Visit
Admission: RE | Admit: 2019-01-03 | Discharge: 2019-01-03 | Disposition: A | Discharge status: Home / Self Care | Payer: Medicare Other | Source: Ambulatory Visit | Attending: Radiation Oncology | Admitting: Radiation Oncology

## 2019-01-03 ENCOUNTER — Other Ambulatory Visit: Payer: Self-pay

## 2019-01-03 DIAGNOSIS — C3412 Malignant neoplasm of upper lobe, left bronchus or lung: Secondary | ICD-10-CM | POA: Diagnosis not present

## 2019-01-03 DIAGNOSIS — Z510 Encounter for antineoplastic radiation therapy: Secondary | ICD-10-CM | POA: Diagnosis not present

## 2019-01-03 DIAGNOSIS — C3492 Malignant neoplasm of unspecified part of left bronchus or lung: Secondary | ICD-10-CM | POA: Diagnosis not present

## 2019-01-06 ENCOUNTER — Ambulatory Visit
Admission: RE | Admit: 2019-01-06 | Discharge: 2019-01-06 | Disposition: A | Discharge status: Home / Self Care | Payer: Medicare Other | Source: Ambulatory Visit | Attending: Radiation Oncology | Admitting: Radiation Oncology

## 2019-01-06 ENCOUNTER — Other Ambulatory Visit: Payer: Self-pay

## 2019-01-06 ENCOUNTER — Encounter (HOSPITAL_COMMUNITY): Payer: Self-pay

## 2019-01-06 DIAGNOSIS — Z510 Encounter for antineoplastic radiation therapy: Secondary | ICD-10-CM | POA: Diagnosis not present

## 2019-01-06 DIAGNOSIS — C3492 Malignant neoplasm of unspecified part of left bronchus or lung: Secondary | ICD-10-CM | POA: Diagnosis not present

## 2019-01-06 DIAGNOSIS — C3412 Malignant neoplasm of upper lobe, left bronchus or lung: Secondary | ICD-10-CM | POA: Diagnosis not present

## 2019-01-06 DIAGNOSIS — Z95828 Presence of other vascular implants and grafts: Secondary | ICD-10-CM

## 2019-01-06 HISTORY — DX: Presence of other vascular implants and grafts: Z95.828

## 2019-01-06 MED ORDER — PROCHLORPERAZINE MALEATE 10 MG PO TABS: 10.0000 mg | ORAL_TABLET | Freq: Four times a day (QID) | ORAL | 1 refills | Status: DC | PRN

## 2019-01-06 MED ORDER — LIDOCAINE-PRILOCAINE 2.5-2.5 % EX CREA: TOPICAL_CREAM | CUTANEOUS | 3 refills | Status: DC

## 2019-01-06 NOTE — Patient Instructions (Addendum)
[facility] Chemotherapy Teaching   You are diagnosed with Stage IIIb adenocarcinoma the left lung.  You will be treated weekly with paclitaxel (Taxol) and carboplatin in conjunction with radiation therapy.  The intent of this treatment is cure.  You will see the doctor regularly throughout treatment.  We will obtain blood work from you prior to every treatment and monitor your results to make sure it is safe to give your treatment. The doctor monitors your response to treatment by the way you are feeling, your blood work, and by obtaining scans periodically.  There will be wait times while you are here for treatment.  It will take about 30 minutes to 1 hour for your lab work to result.  Then there will be wait times while pharmacy mixes your medications.    Medications you will receive in the clinic prior to your chemotherapy medications:  Aloxi:  ALOXI is used in adults to help prevent the nausea and vomiting that happens with certain anti-cancer medicines (chemotherapy).  Aloxi is a long acting medication, and will remain in your system for about 2 days.   Dexamethasone:  This is a steroid given prior to chemotherapy to help prevent allergic reactions; it may also help prevent and control nausea and diarrhea.   Pepcid:  This medication is a histamine blocker that helps prevent an allergic reaction to your chemotherapy.   Benadryl:  This is a histamine blocker (different from the Pepcid) that helps prevent allergic/infusion reactions to your chemotherapy. This medication may cause dizziness/drowsiness.    Paclitaxel (Taxol)  About This Drug Paclitaxel is a drug used to treat cancer. It is given in the vein (IV).  This will take 1 hour to infuse.  This first infusion will take longer to infuse because it is increased slowly to monitor for reactions.  The nurse will be in the room with you for the first 15 minutes of the first infusion.  Possible Side Effects  . Hair loss. Hair  loss is often temporary, although with certain medicine, hair loss can sometimes be permanent. Hair loss may happen suddenly or gradually. If you lose hair, you may lose it from your head, face, armpits, pubic area, chest, and/or legs. You may also notice your hair getting thin.  . Swelling of your legs, ankles and/or feet (edema)  . Flushing  . Nausea and throwing up (vomiting)  . Loose bowel movements (diarrhea)  . Bone marrow depression. This is a decrease in the number of white blood cells, red blood cells, and platelets. This may raise your risk of infection, make you tired and weak (fatigue), and raise your risk of bleeding.  . Effects on the nerves are called peripheral neuropathy. You may feel numbness, tingling, or pain in your hands and feet. It may be hard for you to button your clothes, open jars, or walk as usual. The effect on the nerves may get worse with more doses of the drug. These effects get better in some people after the drug is stopped but it does not get better in all people.  . Changes in your liver function  . Bone, joint and muscle pain  . Abnormal EKG  . Allergic reaction: Allergic reactions, including anaphylaxis are rare but may happen in some patients. Signs of allergic reaction to this drug may be swelling of the face, feeling like your tongue or throat are swelling, trouble breathing, rash, itching, fever, chills, feeling dizzy, and/or feeling that your heart is beating in  a fast or not normal way. If this happens, do not take another dose of this drug. You should get urgent medical treatment.  . Infection  . Changes in your kidney function.  Note: Each of the side effects above was reported in 20% or greater of patients treated with paclitaxel. Not all possible side effects are included above.  Warnings and Precautions  . Severe allergic reactions  . Severe bone marrow depression  Treating Side Effects  . To help with hair loss, wash with a mild  shampoo and avoid washing your hair every day.  . Avoid rubbing your scalp, instead, pat your hair or scalp dry  . Avoid coloring your hair  . Limit your use of hair spray, electric curlers, blow dryers, and curling irons.  . If you are interested in getting a wig, talk to your nurse. You can also call the [place] at [phone] to find out information about the "Look Good, Feel Better" program close to where you live. It is a free program where women getting chemotherapy can learn about wigs, turbans and scarves as well as makeup techniques and skin and nail care.  . Ask your doctor or nurse about medicines that are available to help stop or lessen diarrhea and/or nausea.  . To help with nausea and vomiting, eat small, frequent meals instead of three large meals a day. Choose foods and drinks that are at room temperature. Ask your nurse or doctor about other helpful tips and medicine that is available to help or stop lessen these symptoms.  . If you get diarrhea, eat low-fiber foods that are high in protein and calories and avoid foods that can irritate your digestive tracts or lead to cramping. Ask your nurse or doctor about medicine that can lessen or stop your diarrhea.  . Mouth care is very important. Your mouth care should consist of routine, gentle cleaning of your teeth or dentures and rinsing your mouth with a mixture of 1/2 teaspoon of salt in 8 ounces of water or  teaspoon of baking soda in 8 ounces of water. This should be done at least after each meal and at bedtime.  . If you have mouth sores, avoid mouthwash that has alcohol. Also avoid alcohol and smoking because they can bother your mouth and throat.  . Drink plenty of fluids (a minimum of eight glasses per day is recommended).  . Take your temperature as your doctor or nurse tells you, and whenever you feel like you may have a fever.  . Talk to your doctor or nurse about precautions you can take to avoid  infections and bleeding.  . Be careful when cooking, walking, and handling sharp objects and hot liquids.  Food and Drug Interactions  . There are no known interactions of paclitaxel with food.  . This drug may interact with other medicines. Tell your doctor and pharmacist about all the medicines and dietary supplements (vitamins, minerals, herbs and others) that you are taking at this time.  . The safety and use of dietary supplements and alternative diets are often not known. Using these might affect your cancer or interfere with your treatment. Until more is known, you should not use dietary supplements or alternative diets without your cancer doctor's help.  When to Call the Doctor  Call your doctor or nurse if you have any of the following symptoms and/or any new or unusual symptoms:  . Fever of 100.5 F (38 C) or above  . Chills  .  Redness, pain, warmth, or swelling at the IV site during the infusion  . Signs of allergic reaction: swelling of the face, feeling like your tongue or throat are swelling, trouble breathing, rash, itching, fever, chills, feeling dizzy, and/or feeling that your heart is beating in a fast or not normal way  . Feeling that your heart is beating in a fast or not normal way (palpitations)  . Weight gain of 5 pounds in one week (fluid retention)  . Decreased urine or very dark urine  . Signs of liver problems: dark urine, pale bowel movements, bad stomach pain, feeling very tired and weak, unusual  itching, or yellowing of the eyes or skin  . Heavy menstrual period that lasts longer than normal  . Easy bruising or bleeding  . Nausea that stops you from eating or drinking, and/or that is not relieved by prescribed medicines.  . Loose bowel movements (diarrhea) more than 4 times a day or diarrhea with weakness or lightheadedness  . Pain in your mouth or throat that makes it hard to eat or drink  . Lasting loss of appetite or rapid weight loss of  five pounds in a week  . Signs of peripheral neuropathy: numbness, tingling, or decreased feeling in fingers or toes; trouble walking or changes in the way you walk; or feeling clumsy when buttoning clothes, opening jars, or other routine activities  . Joint and muscle pain that is not relieved by prescribed medicines  . Extreme fatigue that interferes with normal activities  . While you are getting this drug, please tell your nurse right away if you have any pain, redness, or swelling at the site of the IV infusion.  . If you think you are pregnant.  Reproduction Warnings  . Pregnancy warning: This drug may have harmful effects on the unborn child, it is recommended that effective methods of birth control should be used during your cancer treatment. Let your doctor know right away if you think you may be pregnant.  . Breast feeding warning: Women should not breast feed during treatment because this drug could enter the breastmilk and cause harm to a breast feeding baby.   Carboplatin (Paraplatin, CBDCA)  About This Drug Carboplatin is used to treat cancer. It is given in the vein through your port a cath.  It will take 30 minutes to infuse. You will receive this medication on Days 1-4 (Monday-Thursday) of each cycle.    Possible Side Effects . Bone marrow suppression. This is a decrease in the number of white blood cells, red blood cells, and platelets. This may raise your risk of infection, make you tired and weak (fatigue), and raise your risk of bleeding. . Nausea and vomiting (throwing up) . Weakness . Changes in your liver function . Changes in your kidney function . Electrolyte changes . Pain  Note: Each of the side effects above was reported in 20% or greater of patients treated with carboplatin. Not all possible side effects are included above.  Warnings and Precautions . Severe bone marrow suppression . Allergic reactions, including anaphylaxis are rare but may  happen in some patients. Signs of allergic reaction to this drug may be swelling of the face, feeling like your tongue or throat are swelling, trouble breathing, rash, itching, fever, chills, feeling dizzy, and/or feeling that your heart is beating in a fast or not normal way. If this happens, do not take another dose of this drug. You should get urgent medical treatment. . Severe nausea and  vomiting . Effects on the nerves are called peripheral neuropathy. This risk is increased if you are over the age of 72 or if you have received other medicine with risk of peripheral neuropathy. You may feel numbness, tingling, or pain in your hands and feet. It may be hard for you to button your clothes, open jars, or walk as usual. The effect on the nerves may get worse with more doses of the drug. These effects get better in some people after the drug is stopped but it does not get better in all people. Marland Kitchen Blurred vision, loss of vision or other changes in eyesight . Decreased hearing . Skin and tissue irritation including redness, pain, warmth, or swelling at the IV site if the drug leaks out of the vein and into nearby tissue. . Severe changes in your kidney function, which can cause kidney failure . Severe changes in your liver function, which can cause liver failure  Note: Some of the side effects above are very rare. If you have concerns and/or questions, please discuss them with your medical team.  Important Information . This drug may be present in the saliva, tears, sweat, urine, stool, vomit, semen, and vaginal secretions. Talk to your doctor and/or your nurse about the necessary precautions to take during this time.  Treating Side Effects . Manage tiredness by pacing your activities for the day. . Be sure to include periods of rest between energy-draining activities. . To decrease the risk of infection, wash your hands regularly. . Avoid close contact with people who have a cold, the  flu, or other infections. . Take your temperature as your doctor or nurse tells you, and whenever you feel like you may have a fever. . To help decrease the risk of bleeding, use a soft toothbrush. Check with your nurse before using dental floss. . Be very careful when using knives or tools. . Use an electric shaver instead of a razor. . Drink plenty of fluids (a minimum of eight glasses per day is recommended). . If you throw up or have loose bowel movements, you should drink more fluids so that you do not become dehydrated (lack of water in the body from losing too much fluid). . To help with nausea and vomiting, eat small, frequent meals instead of three large meals a day. Choose foods and drinks that are at room temperature. Ask your nurse or doctor about other helpful tips and medicine that is available to help stop or lessen these symptoms. . If you have numbness and tingling in your hands and feet, be careful when cooking, walking, and handling sharp objects and hot liquids. Marland Kitchen Keeping your pain under control is important to your well-being. Please tell your doctor or nurse if you are experiencing pain.  Food and Drug Interactions . There are no known interactions of carboplatin with food. . This drug may interact with other medicines. Tell your doctor and pharmacist about all the prescription and over-the-counter medicines and dietary supplements (vitamins, minerals, herbs and others) that you are taking at this time. Also, check with your doctor or pharmacist before starting any new prescription or over-the-counter medicines, or dietary supplements to make sure that there are no interactions.  When to Call the Doctor Call your doctor or nurse if you have any of these symptoms and/or any new or unusual symptoms: . Fever of 100.4 F (38 C) or higher . Chills . Tiredness that interferes with your daily activities . Feeling dizzy or lightheaded .  Easy bleeding or bruising .  Nausea that stops you from eating or drinking and/or is not relieved by prescribed medicines . Throwing up more than 3 times a day . Blurred vision or other changes in eyesight . Decrease in hearing or ringing in the ear . Signs of allergic reaction: swelling of the face, feeling like your tongue or throat are swelling, trouble breathing, rash, itching, fever, chills, feeling dizzy, and/or feeling that your heart is beating in a fast or not normal way. If this happens, call 911 for emergency care. . While you are getting this drug, please tell your nurse right away if you have any pain, redness, or swelling at the site of the IV infusion . Signs of possible liver problems: dark urine, pale bowel movements, bad stomach pain, feeling very tired and weak, unusual itching, or yellowing of the eyes or skin . Decreased urine, or very dark urine . Numbness, tingling, or pain in your hands and feet . Pain that does not go away or is not relieved by prescribed medicine . If you think you may be pregnant  Reproduction Warnings . Pregnancy warning: This drug may have harmful effects on the unborn baby. Women of child bearing potential should use effective methods of birth control during your cancer treatment. Let your doctor know right away if you think you may be pregnant. . Breastfeeding warning: It is not known if this drug passes into breast milk. For this reason, women should not breastfeed during treatment because this drug could enter the breast milk and cause harm to a breastfeeding baby. . Fertility warning: Human fertility studies have not been done with this drug. Talk with your doctor or nurse if you plan to have children. Ask for information on sperm or egg banking.  SELF CARE ACTIVITIES WHILE RECEIVING CHEMOTHERAPY:  Hydration Increase your fluid intake 48 hours prior to treatment and drink at least 8 to 12 cups (64 ounces) of water/decaffeinated beverages per day after treatment. You  can still have your cup of coffee or soda but these beverages do not count as part of your 8 to 12 cups that you need to drink daily. No alcohol intake.  Medications Continue taking your normal prescription medication as prescribed.  If you start any new herbal or new supplements please let us know first to make sure it is safe.  Mouth Care Have teeth cleaned professionally before starting treatment. Keep dentures and partial plates clean. Use soft toothbrush and do not use mouthwashes that contain alcohol. Biotene is a good mouthwash that is available at most pharmacies or may be ordered by calling [phone]. Use warm salt water gargles (1 teaspoon salt per 1 quart warm water) before and after meals and at bedtime. If you need dental work, please let the doctor know before you go for your appointment so that we can coordinate the best possible time for you in regards to your chemo regimen. You need to also let your dentist know that you are actively taking chemo. We may need to do labs prior to your dental appointment.  Skin Care Always use sunscreen that has not expired and with SPF (Sun Protection Factor) of 50 or higher. Wear hats to protect your head from the sun. Remember to use sunscreen on your hands, ears, face, & feet.  Use good moisturizing lotions such as udder cream, eucerin, or even Vaseline. Some chemotherapies can cause dry skin, color changes in your skin and nails.    . Avoid  long, hot showers or baths. . Use gentle, fragrance-free soaps and laundry detergent. . Use moisturizers, preferably creams or ointments rather than lotions because the thicker consistency is better at preventing skin dehydration. Apply the cream or ointment within 15 minutes of showering. Reapply moisturizer at night, and moisturize your hands every time after you wash them.  Hair Loss (if your doctor says your hair will fall out)  . If your doctor says that your hair is likely to fall out, decide before  you begin chemo whether you want to wear a wig. You may want to shop before treatment to match your hair color. . Hats, turbans, and scarves can also camouflage hair loss, although some people prefer to leave their heads uncovered. If you go bare-headed outdoors, be sure to use sunscreen on your scalp. . Cut your hair short. It eases the inconvenience of shedding lots of hair, but it also can reduce the emotional impact of watching your hair fall out. . Don't perm or color your hair during chemotherapy. Those chemical treatments are already damaging to hair and can enhance hair loss. Once your chemo treatments are done and your hair has grown back, it's OK to resume dyeing or perming hair.  With chemotherapy, hair loss is almost always temporary. But when it grows back, it may be a different color or texture. In older adults who still had hair color before chemotherapy, the new growth may be completely gray.  Often, new hair is very fine and soft.  Infection Prevention Please wash your hands for at least 30 seconds using warm soapy water. Handwashing is the #1 way to prevent the spread of germs. Stay away from sick people or people who are getting over a cold. If you develop respiratory systems such as green/yellow mucus production or productive cough or persistent cough let us know and we will see if you need an antibiotic. It is a good idea to keep a pair of gloves on when going into grocery stores/[employer] to decrease your risk of coming into contact with germs on the carts, etc. Carry alcohol hand gel with you at all times and use it frequently if out in public. If your temperature reaches 100.5 or higher please call the clinic and let us know.  If it is after hours or on the weekend please go to the ER if your temperature is over 100.5.  Please have your own personal thermometer at home to use.    Sex and bodily fluids If you are going to have sex, a condom must be used to protect the person that  isn't taking chemotherapy. Chemo can decrease your libido (sex drive). For a few days after chemotherapy, chemotherapy can be excreted through your bodily fluids.  When using the toilet please close the lid and flush the toilet twice.  Do this for a few day after you have had chemotherapy.   Effects of chemotherapy on your sex life Some changes are simple and won't last long. They won't affect your sex life permanently.  Sometimes you may feel: . too tired . not strong enough to be very active . sick or sore  . not in the mood . anxious or low Your anxiety might not seem related to sex. For example, you may be worried about the cancer and how your treatment is going. Or you may be worried about money, or about how you family are coping with your illness. These things can cause stress, which can affect your interest  in sex. It's important to talk to your partner about how you feel. Remember - the changes to your sex life don't usually last long. There's usually no medical reason to stop having sex during chemo. The drugs won't have any long term physical effects on your performance or enjoyment of sex. Cancer can't be passed on to your partner during sex  Contraception It's important to use reliable contraception during treatment. Avoid getting pregnant while you or your partner are having chemotherapy. This is because the drugs may harm the baby. Sometimes chemotherapy drugs can leave a man or woman infertile.  This means you would not be able to have children in the future. You might want to talk to someone about permanent infertility. It can be very difficult to learn that you may no longer be able to have children. Some people find counselling helpful. There might be ways to preserve your fertility, although this is easier for men than for women. You may want to speak to a fertility expert. You can talk about sperm banking or harvesting your eggs. You can also ask about other fertility options,  such as donor eggs. If you have or have had breast cancer, your doctor might advise you not to take the contraceptive pill. This is because the hormones in it might affect the cancer.  It is not known for sure whether or not chemotherapy drugs can be passed on through semen or secretions from the vagina. Because of this some doctors advise people to use a barrier method if you have sex during treatment. This applies to vaginal, anal or oral sex. Generally, doctors advise a barrier method only for the time you are actually having the treatment and for about a week after your treatment. Advice like this can be worrying, but this does not mean that you have to avoid being intimate with your partner. You can still have close contact with your partner and continue to enjoy sex.  Animals If you have cats or birds we just ask that you not change the litter or change the cage.  Please have someone else do this for you while you are on chemotherapy.   Food Safety During and After Cancer Treatment Food safety is important for people both during and after cancer treatment. Cancer and cancer treatments, such as chemotherapy, radiation therapy, and stem cell/bone marrow transplantation, often weaken the immune system. This makes it harder for your body to protect itself from foodborne illness, also called food poisoning. Foodborne illness is caused by eating food that contains harmful bacteria, parasites, or viruses.  Foods to avoid Some foods have a higher risk of becoming tainted with bacteria. These include: Marland Kitchen Unwashed fresh fruit and vegetables, especially leafy vegetables that can hide dirt and other contaminants . Raw sprouts, such as alfalfa sprouts . Raw or undercooked beef, especially ground beef, or other raw or undercooked meat and poultry . Fatty, fried, or spicy foods immediately before or after treatment.  These can sit heavy on your stomach and make you feel nauseous. . Raw or undercooked  shellfish, such as oysters. . Sushi and sashimi, which often contain raw fish.  . Unpasteurized beverages, such as unpasteurized fruit juices, raw milk, raw yogurt, or cider . Undercooked eggs, such as soft boiled, over easy, and poached; raw, unpasteurized eggs; or foods made with raw egg, such as homemade raw cookie dough and homemade mayonnaise  Simple steps for food safety  Shop smart. . Do not buy food stored or displayed  in an unclean area. . Do not buy bruised or damaged fruits or vegetables. . Do not buy cans that have cracks, dents, or bulges. . Pick up foods that can spoil at the end of your shopping trip and store them in a cooler on the way home.  Prepare and clean up foods carefully. . Rinse all fresh fruits and vegetables under running water, and dry them with a clean towel or paper towel. . Clean the top of cans before opening them. . After preparing food, wash your hands for 20 seconds with hot water and soap. Pay special attention to areas between fingers and under nails. . Clean your utensils and dishes with hot water and soap. Marland Kitchen Disinfect your kitchen and cutting boards using 1 teaspoon of liquid, unscented bleach mixed into 1 quart of water.    Dispose of old food. . Eat canned and packaged food before its expiration date (the "use by" or "best before" date). . Consume refrigerated leftovers within 3 to 4 days. After that time, throw out the food. Even if the food does not smell or look spoiled, it still may be unsafe. Some bacteria, such as Listeria, can grow even on foods stored in the refrigerator if they are kept for too long.  Take precautions when eating out. . At restaurants, avoid buffets and salad bars where food sits out for a long time and comes in contact with many people. Food can become contaminated when someone with a virus, often a norovirus, or another "bug" handles it. . Put any leftover food in a "to-go" container yourself, rather than having the  server do it. And, refrigerate leftovers as soon as you get home. . Choose restaurants that are clean and that are willing to prepare your food as you order it cooked.   AT HOME MEDICATIONS:                                                                                                                                                                Compazine/Prochlorperazine 10mg  tablet. Take 1 tablet every 6 hours as needed for nausea/vomiting. (This can make you sleepy)   EMLA cream. Apply a quarter size amount to port site 1 hour prior to chemo. Do not rub in. Cover with plastic wrap.    Diarrhea Sheet   If you are having loose stools/diarrhea, please purchase Imodium and begin taking as outlined:  At the first sign of poorly formed or loose stools you should begin taking Imodium (loperamide) 2 mg capsules.  Take two tablets (4mg ) followed by one tablet (2mg ) every 2 hours - DO NOT EXCEED 8 tablets in 24 hours.  If it is bedtime and you are having loose stools, take 2 tablets at bedtime, then 2 tablets every 4  hours until morning.   Always call the [facility] if you are having loose stools/diarrhea that you can't get under control.  Loose stools/diarrhea leads to dehydration (loss of water) in your body.  We have other options of trying to get the loose stools/diarrhea to stop but you must let us know!   Constipation Sheet  Colace - 100 mg capsules - take 2 capsules daily.  If this doesn't help then you can increase to 2 capsules twice daily.  Please call if the above does not work for you. Do not go more than 2 days without a bowel movement.  It is very important that you do not become constipated.  It will make you feel sick to your stomach (nausea) and can cause abdominal pain and vomiting.  Nausea Sheet   Compazine/Prochlorperazine 10mg  tablet. Take 1 tablet every 6 hours as needed for nausea/vomiting (This can make you drowsy).  If you are having persistent nausea (nausea that  does not stop) please call the [facility] and let us know the amount of nausea that you are experiencing.  If you begin to vomit, you need to call the [facility] and if it is the weekend and you have vomited more than one time and can't get it to stop-go to the Emergency Room.  Persistent nausea/vomiting can lead to dehydration (loss of fluid in your body) and will make you feel very weak and unwell. Ice chips, sips of clear liquids, foods that are at room temperature, crackers, and toast tend to be better tolerated.    SYMPTOMS TO REPORT AS SOON AS POSSIBLE AFTER TREATMENT:  FEVER GREATER THAN 100.5 F  CHILLS WITH OR WITHOUT FEVER  NAUSEA AND VOMITING THAT IS NOT CONTROLLED WITH YOUR NAUSEA MEDICATION  UNUSUAL SHORTNESS OF BREATH  UNUSUAL BRUISING OR BLEEDING  TENDERNESS IN MOUTH AND THROAT WITH OR WITHOUT   PRESENCE OF ULCERS  URINARY PROBLEMS  BOWEL PROBLEMS  UNUSUAL RASH      Wear comfortable clothing and clothing appropriate for easy access to any Portacath or PICC line. Let us know if there is anything that we can do to make your therapy better!    What to do if you need assistance after hours or on the weekends: CALL [phone].  HOLD on the line, do not hang up.  You will hear multiple messages but at the end you will be connected with a nurse triage line.  They will contact the doctor if necessary.  Most of the time they will be able to assist you.  Do not call the hospital operator.      I have been informed and understand all of the instructions given to me and have received a copy. I have been instructed to call the clinic [phone] or my family physician as soon as possible for continued medical care, if indicated. I do not have any more questions at this time but understand that I may call the [facility] or the Patient Navigator at [phone] during office hours should I have questions or need assistance in obtaining follow-up  care.

## 2019-01-07 ENCOUNTER — Encounter (HOSPITAL_COMMUNITY): Payer: Self-pay | Admitting: General Practice

## 2019-01-07 ENCOUNTER — Inpatient Hospital Stay (HOSPITAL_COMMUNITY): Payer: Medicare Other | Admitting: General Practice

## 2019-01-07 ENCOUNTER — Inpatient Hospital Stay (HOSPITAL_COMMUNITY): Payer: Medicare Other

## 2019-01-07 ENCOUNTER — Ambulatory Visit
Admission: RE | Admit: 2019-01-07 | Discharge: 2019-01-07 | Disposition: A | Discharge status: Home / Self Care | Payer: Medicare Other | Source: Ambulatory Visit | Attending: Radiation Oncology | Admitting: Radiation Oncology

## 2019-01-07 ENCOUNTER — Other Ambulatory Visit: Payer: Self-pay

## 2019-01-07 VITALS — BP 141/69 | HR 98 | Temp 97.5°F | Resp 16 | Wt 278.2 lb

## 2019-01-07 DIAGNOSIS — C3412 Malignant neoplasm of upper lobe, left bronchus or lung: Secondary | ICD-10-CM | POA: Diagnosis not present

## 2019-01-07 DIAGNOSIS — Z95828 Presence of other vascular implants and grafts: Secondary | ICD-10-CM

## 2019-01-07 DIAGNOSIS — C3492 Malignant neoplasm of unspecified part of left bronchus or lung: Secondary | ICD-10-CM | POA: Diagnosis not present

## 2019-01-07 DIAGNOSIS — Z5111 Encounter for antineoplastic chemotherapy: Secondary | ICD-10-CM | POA: Diagnosis not present

## 2019-01-07 DIAGNOSIS — R110 Nausea: Secondary | ICD-10-CM | POA: Diagnosis not present

## 2019-01-07 DIAGNOSIS — Z510 Encounter for antineoplastic radiation therapy: Secondary | ICD-10-CM | POA: Diagnosis not present

## 2019-01-07 MED ORDER — SODIUM CHLORIDE 0.9 % IV SOLN
266.4000 mg | Freq: Once | INTRAVENOUS | Status: AC
  Administered 2019-01-07: 270 mg via INTRAVENOUS
  Filled 2019-01-07: qty 27

## 2019-01-07 MED ORDER — SODIUM CHLORIDE 0.9 % IV SOLN
20.0000 mg | Freq: Once | INTRAVENOUS | Status: AC
  Administered 2019-01-07: 20 mg via INTRAVENOUS
  Filled 2019-01-07: qty 20

## 2019-01-07 MED ORDER — PALONOSETRON HCL INJECTION 0.25 MG/5ML
0.2500 mg | Freq: Once | INTRAVENOUS | Status: AC
  Administered 2019-01-07: 0.25 mg via INTRAVENOUS
  Filled 2019-01-07: qty 5

## 2019-01-07 MED ORDER — HEPARIN SOD (PORK) LOCK FLUSH 100 UNIT/ML IV SOLN
500.0000 [IU] | Freq: Once | INTRAVENOUS | Status: AC | PRN
  Administered 2019-01-07: 500 [IU]

## 2019-01-07 MED ORDER — DIPHENHYDRAMINE HCL 50 MG/ML IJ SOLN
50.0000 mg | Freq: Once | INTRAMUSCULAR | Status: AC
  Administered 2019-01-07: 50 mg via INTRAVENOUS
  Filled 2019-01-07: qty 1

## 2019-01-07 MED ORDER — FAMOTIDINE IN NACL 20-0.9 MG/50ML-% IV SOLN
20.0000 mg | Freq: Once | INTRAVENOUS | Status: AC
  Administered 2019-01-07: 20 mg via INTRAVENOUS
  Filled 2019-01-07: qty 50

## 2019-01-07 MED ORDER — SODIUM CHLORIDE 0.9% FLUSH
10.0000 mL | INTRAVENOUS | Status: DC | PRN
  Administered 2019-01-07: 10 mL
  Filled 2019-01-07: qty 10

## 2019-01-07 MED ORDER — SODIUM CHLORIDE 0.9 % IV SOLN
Freq: Once | INTRAVENOUS | Status: AC
  Administered 2019-01-07: 09:00:00 via INTRAVENOUS

## 2019-01-07 MED ORDER — SODIUM CHLORIDE 0.9 % IV SOLN
45.0000 mg/m2 | Freq: Once | INTRAVENOUS | Status: AC
  Administered 2019-01-07: 108 mg via INTRAVENOUS
  Filled 2019-01-07: qty 18

## 2019-01-07 NOTE — Patient Instructions (Signed)
[facility] Discharge Instructions for Patients Receiving Chemotherapy  Today you received the following chemotherapy agents   To help prevent nausea and vomiting after your treatment, we encourage you to take your nausea medication   If you develop nausea and vomiting that is not controlled by your nausea medication, call the clinic.   BELOW ARE SYMPTOMS THAT SHOULD BE REPORTED IMMEDIATELY:  *FEVER GREATER THAN 100.5 F  *CHILLS WITH OR WITHOUT FEVER  NAUSEA AND VOMITING THAT IS NOT CONTROLLED WITH YOUR NAUSEA MEDICATION  *UNUSUAL SHORTNESS OF BREATH  *UNUSUAL BRUISING OR BLEEDING  TENDERNESS IN MOUTH AND THROAT WITH OR WITHOUT PRESENCE OF ULCERS  *URINARY PROBLEMS  *BOWEL PROBLEMS  UNUSUAL RASH Items with * indicate a potential emergency and should be followed up as soon as possible.  Feel free to call the clinic should you have any questions or concerns. The clinic phone number is [phone].  Please show the CHEMO ALERT CARD at check-in to the Emergency Department and triage nurse.   

## 2019-01-07 NOTE — Progress Notes (Signed)
Labs reviewed today . Ok to proceed with treatment today per Reynolds Bowl NP  Treatment given per orders. Patient tolerated it well without problems. Vitals stable and discharged home from clinic ambulatory. Follow up as scheduled.

## 2019-01-07 NOTE — Progress Notes (Signed)
[facility] CSW Progress Notes  Call to patient to complete social work initial assessment, no answer at home number.  Left generic VM requesting call back.  Edwyna Shell, LCSW Clinical Social Worker Phone:  [phone] Cell:  [phone]

## 2019-01-07 NOTE — Progress Notes (Signed)

## 2019-01-08 ENCOUNTER — Ambulatory Visit
Admission: RE | Admit: 2019-01-08 | Discharge: 2019-01-08 | Disposition: A | Discharge status: Home / Self Care | Payer: Medicare Other | Source: Ambulatory Visit | Attending: Radiation Oncology | Admitting: Radiation Oncology

## 2019-01-08 ENCOUNTER — Other Ambulatory Visit: Payer: Self-pay

## 2019-01-08 ENCOUNTER — Encounter: Payer: Self-pay | Admitting: General Practice

## 2019-01-08 ENCOUNTER — Telehealth (HOSPITAL_COMMUNITY): Payer: Self-pay

## 2019-01-08 DIAGNOSIS — C3412 Malignant neoplasm of upper lobe, left bronchus or lung: Secondary | ICD-10-CM | POA: Diagnosis not present

## 2019-01-08 DIAGNOSIS — Z510 Encounter for antineoplastic radiation therapy: Secondary | ICD-10-CM | POA: Diagnosis not present

## 2019-01-08 DIAGNOSIS — C3492 Malignant neoplasm of unspecified part of left bronchus or lung: Secondary | ICD-10-CM | POA: Diagnosis not present

## 2019-01-08 NOTE — Progress Notes (Signed)
[facility] Initial Psychosocial Assessment Clinical Social Work  Clinical Social Work contacted by phone to assess psychosocial, emotional, mental health, and spiritual needs of the patient.   Barriers to care/review of distress screen:  - Transportation:  Do you anticipate any problems getting to appointments?  Do you have someone who can help run errands for you if you need it?  Has help w transportation. Lives equally distant from [facility] and [facility].   - Help at home:  What is your living situation (alone, family, other)?  If you are physically unable to care for yourself, who would you call on to help you?  Mother (50) lives w her.  Has sitters for mother to assist w physical care.  Has to have someone with her at all times.  "Good supportive family, sisters, brothers, neighbors."   - Support system:  What does your support system look like?  Who would you call on if you needed some kind of practical help?  What if you needed someone to talk to for emotional support?  Lots of help from family and friends and neighbors.  Belongs to church where she has members that "feel ike family, has friends I have had my whole life."  "Real uplifted by a lot of them."   - Finances:  Are you concerned about finances.  Considering returning to work?  If not, applying for disability?  Mother lives w her, combining bills w her.  Is still working, works from home at present.  Has not retired yet.    What is your understanding of where you are with your cancer? Its cause?  Your treatment plan and what happens next?  New diagnosis of lung cancer.  "When COVID came and we had to put masks on, I'd get to breathing and feel like I was getting a chest cold."  After being sent home to work, spit up bloody mucous.  Made PCP aoot who ordered chest CT which showed lung spot, got needle biopsy.  Smoked after high school, quit at least 30 years ago.  Surprised by diagnosis of lung cancer.  Was scared at first, but "it passed", and "I  accepted it I guess."  What are your worries for the future as you begin treatment for cancer?  Not really worried about treatments, "I havent given it a second thought."  My mother is a 36 year cancer survivor, sister is a breast cancer survivor.  Pt is familiar with process of chemo and radiation after helping sister through her treatments 4 years ago.  She is first lung cancer patient in family.  Has seen others go through this experience.  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  Working in that, you get used to seeing things like that, I always thought lung cancer had no hope."    What are your hopes and priorities during your treatment? What is important to you? What are your goals for your care? "Hoie this treatment for the cancer is working, does what it is supposed to do."  Keep up my strength, "I want to keep my job, Im not ready to give it up."  Maintain energy it takes to do her job.  "even a sitting down job takes energy."    What are you willing to sacrifice during your treatment? Has been thinking about giving up her one cup of coffee/day.  Was told not to do caffeine.  Will closely  follow all recommendations and is willing to do whatever it takes to get well.  What are you NOT willing to sacrifice during your treatment?  Hopes to continue to work and maintain her job  Has had to reduce hours due to treatments (radiation and chemo)   CSW Summary:  Patient and family psychosocial functioning including strengths, limitations, and coping skills:  New diagnosis of lung cancer, has significant social support from friends/family/neighbors.  Currently working reduced hours at job she can work from home.  Mother lives with her - mother requires round the clock supervision but has multiple caregivers who provide physical care so patient is not required to do this.  Positive attitude towards this unexpected diagnosis, has seen other  family members successfully cope with cancer and live long lives. Thus far has tolerated both chemo and radiation treatments with minimal side effects.  Finances are stable as she is continuing to work from home.    Identifications of barriers to care: None noted other than cost of transportation, is paying for someone to give her rides to radiation.  Will enroll in [place] gas card program.  Availability of community resources:  [facility] Cancer [facility], [facility] [place], [place] programming, possibly [employer] while in radiation.    Clinical Social Worker follow up needed: No.   Edwyna Shell, [place] Social Worker Phone:  [phone] Cell:  [phone]

## 2019-01-08 NOTE — Progress Notes (Signed)
[facility] CSW Progress Notes  L[facility] [employer] gas card program application submitted via secure email on patient behalf.  Edwyna Shell, LCSW Clinical Social Worker Phone:  [phone]

## 2019-01-08 NOTE — Telephone Encounter (Signed)
24 hour follow up-no complaints this morning from patient. She has been to radiation and home now. Encouraged her to call for any concerns or questions.

## 2019-01-09 ENCOUNTER — Other Ambulatory Visit: Payer: Self-pay

## 2019-01-09 ENCOUNTER — Ambulatory Visit
Admission: RE | Admit: 2019-01-09 | Discharge: 2019-01-09 | Disposition: A | Discharge status: Home / Self Care | Payer: Medicare Other | Source: Ambulatory Visit | Attending: Radiation Oncology | Admitting: Radiation Oncology

## 2019-01-09 DIAGNOSIS — Z510 Encounter for antineoplastic radiation therapy: Secondary | ICD-10-CM | POA: Diagnosis not present

## 2019-01-09 DIAGNOSIS — C3492 Malignant neoplasm of unspecified part of left bronchus or lung: Secondary | ICD-10-CM | POA: Diagnosis not present

## 2019-01-09 DIAGNOSIS — C3412 Malignant neoplasm of upper lobe, left bronchus or lung: Secondary | ICD-10-CM | POA: Diagnosis not present

## 2019-01-10 ENCOUNTER — Ambulatory Visit
Admission: RE | Admit: 2019-01-10 | Discharge: 2019-01-10 | Disposition: A | Discharge status: Home / Self Care | Payer: Medicare Other | Source: Ambulatory Visit | Attending: Radiation Oncology | Admitting: Radiation Oncology

## 2019-01-10 ENCOUNTER — Other Ambulatory Visit: Payer: Self-pay

## 2019-01-10 DIAGNOSIS — C3412 Malignant neoplasm of upper lobe, left bronchus or lung: Secondary | ICD-10-CM | POA: Diagnosis not present

## 2019-01-10 DIAGNOSIS — Z510 Encounter for antineoplastic radiation therapy: Secondary | ICD-10-CM | POA: Diagnosis not present

## 2019-01-10 DIAGNOSIS — C3492 Malignant neoplasm of unspecified part of left bronchus or lung: Secondary | ICD-10-CM | POA: Diagnosis not present

## 2019-01-13 ENCOUNTER — Ambulatory Visit
Admission: RE | Admit: 2019-01-13 | Discharge: 2019-01-13 | Disposition: A | Discharge status: Home / Self Care | Payer: Medicare Other | Source: Ambulatory Visit | Attending: Radiation Oncology | Admitting: Radiation Oncology

## 2019-01-13 ENCOUNTER — Other Ambulatory Visit: Payer: Self-pay

## 2019-01-13 DIAGNOSIS — Z510 Encounter for antineoplastic radiation therapy: Secondary | ICD-10-CM | POA: Diagnosis not present

## 2019-01-13 DIAGNOSIS — C3412 Malignant neoplasm of upper lobe, left bronchus or lung: Secondary | ICD-10-CM | POA: Diagnosis not present

## 2019-01-13 DIAGNOSIS — C3492 Malignant neoplasm of unspecified part of left bronchus or lung: Secondary | ICD-10-CM | POA: Diagnosis not present

## 2019-01-14 ENCOUNTER — Encounter (HOSPITAL_COMMUNITY): Payer: Self-pay | Admitting: Hematology

## 2019-01-14 ENCOUNTER — Inpatient Hospital Stay (HOSPITAL_COMMUNITY): Payer: Medicare Other

## 2019-01-14 ENCOUNTER — Inpatient Hospital Stay (HOSPITAL_COMMUNITY): Payer: Medicare Other | Attending: Hematology | Admitting: Hematology

## 2019-01-14 ENCOUNTER — Other Ambulatory Visit: Payer: Self-pay

## 2019-01-14 ENCOUNTER — Ambulatory Visit
Admission: RE | Admit: 2019-01-14 | Discharge: 2019-01-14 | Disposition: A | Discharge status: Home / Self Care | Payer: Medicare Other | Source: Ambulatory Visit | Attending: Radiation Oncology | Admitting: Radiation Oncology

## 2019-01-14 VITALS — BP 130/56 | HR 99 | Temp 98.2°F | Resp 18 | Wt 278.8 lb

## 2019-01-14 DIAGNOSIS — C3412 Malignant neoplasm of upper lobe, left bronchus or lung: Secondary | ICD-10-CM | POA: Insufficient documentation

## 2019-01-14 DIAGNOSIS — C3492 Malignant neoplasm of unspecified part of left bronchus or lung: Secondary | ICD-10-CM | POA: Diagnosis not present

## 2019-01-14 DIAGNOSIS — Z5111 Encounter for antineoplastic chemotherapy: Secondary | ICD-10-CM | POA: Diagnosis not present

## 2019-01-14 DIAGNOSIS — Z95828 Presence of other vascular implants and grafts: Secondary | ICD-10-CM

## 2019-01-14 DIAGNOSIS — R110 Nausea: Secondary | ICD-10-CM | POA: Diagnosis not present

## 2019-01-14 DIAGNOSIS — Z510 Encounter for antineoplastic radiation therapy: Secondary | ICD-10-CM | POA: Diagnosis not present

## 2019-01-14 LAB — COMPREHENSIVE METABOLIC PANEL
ALT: 14 U/L (ref 0–44)
AST: 16 U/L (ref 15–41)
Albumin: 3.2 g/dL — ABNORMAL LOW (ref 3.5–5.0)
Alkaline Phosphatase: 99 U/L (ref 38–126)
Anion gap: 7 (ref 5–15)
BUN: 10 mg/dL (ref 8–23)
CO2: 24 mmol/L (ref 22–32)
Calcium: 8.5 mg/dL — ABNORMAL LOW (ref 8.9–10.3)
Chloride: 109 mmol/L (ref 98–111)
Creatinine, Ser: 0.66 mg/dL (ref 0.44–1.00)
GFR calc Af Amer: >60 mL/min (ref >60)
GFR calc non Af Amer: >60 mL/min (ref >60)
Glucose, Bld: 112 mg/dL — ABNORMAL HIGH (ref 70–99)
Potassium: 3.6 mmol/L (ref 3.5–5.1)
Sodium: 140 mmol/L (ref 135–145)
Total Bilirubin: 0.5 mg/dL (ref 0.3–1.2)
Total Protein: 6.8 g/dL (ref 6.5–8.1)

## 2019-01-14 LAB — CBC WITH DIFFERENTIAL/PLATELET
Abs Immature Granulocytes: 0.03 10*3/uL (ref 0.00–0.07)
Basophils Absolute: 0 10*3/uL (ref 0.0–0.1)
Basophils Relative: 0 %
Eosinophils Absolute: 0.1 10*3/uL (ref 0.0–0.5)
Eosinophils Relative: 1 %
HCT: 35.5 % — ABNORMAL LOW (ref 36.0–46.0)
Hemoglobin: 10.6 g/dL — ABNORMAL LOW (ref 12.0–15.0)
Immature Granulocytes: 1 %
Lymphocytes Relative: 14 %
Lymphs Abs: 0.8 10*3/uL (ref 0.7–4.0)
MCH: 25.2 pg — ABNORMAL LOW (ref 26.0–34.0)
MCHC: 29.9 g/dL — ABNORMAL LOW (ref 30.0–36.0)
MCV: 84.3 fL (ref 80.0–100.0)
Monocytes Absolute: 0.5 10*3/uL (ref 0.1–1.0)
Monocytes Relative: 8 %
Neutro Abs: 4.6 10*3/uL (ref 1.7–7.7)
Neutrophils Relative %: 76 %
Platelets: 436 10*3/uL — ABNORMAL HIGH (ref 150–400)
RBC: 4.21 MIL/uL (ref 3.87–5.11)
RDW: 15.9 % — ABNORMAL HIGH (ref 11.5–15.5)
WBC: 6 10*3/uL (ref 4.0–10.5)
nRBC: 0 % (ref 0.0–0.2)

## 2019-01-14 MED ORDER — SODIUM CHLORIDE 0.9 % IV SOLN: 20.0000 mg | Freq: Once | INTRAVENOUS | Status: DC

## 2019-01-14 MED ORDER — SODIUM CHLORIDE 0.9 % IV SOLN
Freq: Once | INTRAVENOUS | Status: AC
  Administered 2019-01-14: 09:00:00 via INTRAVENOUS

## 2019-01-14 MED ORDER — FAMOTIDINE IN NACL 20-0.9 MG/50ML-% IV SOLN
20.0000 mg | Freq: Once | INTRAVENOUS | Status: AC
  Administered 2019-01-14: 20 mg via INTRAVENOUS

## 2019-01-14 MED ORDER — PALONOSETRON HCL INJECTION 0.25 MG/5ML
0.2500 mg | Freq: Once | INTRAVENOUS | Status: AC
  Administered 2019-01-14: 0.25 mg via INTRAVENOUS

## 2019-01-14 MED ORDER — PALONOSETRON HCL INJECTION 0.25 MG/5ML
INTRAVENOUS | Status: AC
  Filled 2019-01-14: qty 5

## 2019-01-14 MED ORDER — FAMOTIDINE IN NACL 20-0.9 MG/50ML-% IV SOLN
INTRAVENOUS | Status: AC
  Filled 2019-01-14: qty 50

## 2019-01-14 MED ORDER — SODIUM CHLORIDE 0.9% FLUSH
10.0000 mL | INTRAVENOUS | Status: DC | PRN
  Administered 2019-01-14: 10 mL
  Filled 2019-01-14: qty 10

## 2019-01-14 MED ORDER — SODIUM CHLORIDE 0.9 % IV SOLN
266.4000 mg | Freq: Once | INTRAVENOUS | Status: AC
  Administered 2019-01-14: 270 mg via INTRAVENOUS
  Filled 2019-01-14: qty 27

## 2019-01-14 MED ORDER — DIPHENHYDRAMINE HCL 50 MG/ML IJ SOLN
50.0000 mg | Freq: Once | INTRAMUSCULAR | Status: AC
  Administered 2019-01-14: 50 mg via INTRAVENOUS
  Filled 2019-01-14: qty 1

## 2019-01-14 MED ORDER — SODIUM CHLORIDE 0.9 % IV SOLN
45.0000 mg/m2 | Freq: Once | INTRAVENOUS | Status: AC
  Administered 2019-01-14: 108 mg via INTRAVENOUS
  Filled 2019-01-14: qty 18

## 2019-01-14 MED ORDER — HEPARIN SOD (PORK) LOCK FLUSH 100 UNIT/ML IV SOLN
500.0000 [IU] | Freq: Once | INTRAVENOUS | Status: AC | PRN
  Administered 2019-01-14: 500 [IU]

## 2019-01-14 MED ORDER — SODIUM CHLORIDE 0.9 % IV SOLN
Freq: Once | INTRAVENOUS | Status: AC
  Administered 2019-01-14: 10:00:00 via INTRAVENOUS
  Filled 2019-01-14: qty 5

## 2019-01-14 NOTE — Patient Instructions (Signed)
[facility] at [facility] Discharge Instructions  You were seen today by Dr. Katragadda. He went over your recent lab results. He will see you back in 1 week for labs, treatment and follow up.   Thank you for choosing [facility] at [facility] to provide your oncology and hematology care.  To afford each patient quality time with our provider, please arrive at least 15 minutes before your scheduled appointment time.   If you have a lab appointment with the Cancer Center please come in thru the  Main Entrance and check in at the main information desk  You need to re-schedule your appointment should you arrive 10 or more minutes late.  We strive to give you quality time with our providers, and arriving late affects you and other patients whose appointments are after yours.  Also, if you no show three or more times for appointments you may be dismissed from the clinic at the providers discretion.     Again, thank you for choosing [facility].  Our hope is that these requests will decrease the amount of time that you wait before being seen by our physicians.       _____________________________________________________________  Should you have questions after your visit to [facility], please contact our office at [phone] between the hours of 8:00 a.m. and 4:30 p.m.  Voicemails left after 4:00 p.m. will not be returned until the following business day.  For prescription refill requests, have your pharmacy contact our office and allow 72 hours.    Cancer Center Support Programs:   > Cancer Support Group  2nd Tuesday of the month 1pm-2pm, Journey Room    

## 2019-01-14 NOTE — Progress Notes (Signed)
0858 Labs reviewed with and pt seen by Dr. Delton Coombes and pt approved for chemo tx today per MD                            [name] tolerated chemo tx well without complaints or incident. VSS upon discharge. Pt discharged self ambulatory in satisfactory condition

## 2019-01-14 NOTE — Progress Notes (Signed)
[address], [place] [address]   CLINIC:  Medical Oncology/Hematology  PCP:  Iona Beard, MD [address] [place] [address] [phone]   REASON FOR VISIT:  Stage III left lung adenocarcinoma.  CURRENT THERAPY: Weekly carbotaxol with XRT.    INTERVAL HISTORY:  Ms. Mcintire 68 y.o. female seen for follow-up and management of lung cancer.  She received first cycle of chemotherapy on 01/07/2019.  She had experienced 1 day of nausea and severe diarrhea.  She denied any vomiting.  She reported some mild sore throat on the left side of the throat.  Numbness in the toes has been stable.  Appetite is 75%.  Energy levels are low.  Denies any fevers or infections.  Shortness of breath on exertion is also stable.    REVIEW OF SYSTEMS:  Review of Systems  Respiratory: Positive for shortness of breath.   Cardiovascular: Positive for leg swelling.  Gastrointestinal: Positive for diarrhea and nausea.  All other systems reviewed and are negative.    PAST MEDICAL/SURGICAL HISTORY:  Past Medical History:  Diagnosis Date  . Anemia   . Arthritis   . Diabetes mellitus    diet controlled  . GERD (gastroesophageal reflux disease)   . HTN (hypertension)   . HTN (hypertension)   . Port-A-Cath in place 01/06/2019  . Shortness of breath dyspnea    with exertion   Past Surgical History:  Procedure Laterality Date  . CHOLECYSTECTOMY  1997  . COLONOSCOPY    . GASTRIC BYPASS    . INCISIONAL HERNIA REPAIR  04/11/11  . IR IMAGING GUIDED PORT INSERTION  12/27/2018  . LAPAROSCOPIC SALPINGOOPHERECTOMY    . LAPAROTOMY  04/11/2011   Procedure: EXPLORATORY LAPAROTOMY;  Surgeon: Joyice Faster. Cornett, MD;  Location: [facility];  Service: General;  Laterality: N/A;  closure port hole  . TOTAL HIP ARTHROPLASTY  03/07/2012   Procedure: TOTAL HIP ARTHROPLASTY ANTERIOR APPROACH;  Surgeon: Mauri Pole, MD;  Location: [facility];  Service: Orthopedics;  Laterality: Right;   . TOTAL SHOULDER ARTHROPLASTY Left 01/21/2015  . TOTAL SHOULDER ARTHROPLASTY Left 01/21/2015   Procedure: LEFT TOTAL SHOULDER ARTHROPLASTY;  Surgeon: Justice Britain, MD;  Location: [facility];  Service: Orthopedics;  Laterality: Left;  Marland Kitchen VAGINAL HYSTERECTOMY       SOCIAL HISTORY:  Social History   Socioeconomic History  . Marital status: Single    Spouse name: Not on file  . Number of children: Not on file  . Years of education: 12th grade  . Highest education level: Not on file  Occupational History  . Occupation: Employed    Employer: [employer]  . Financial resource strain: Not very hard  . Food insecurity    Worry: Never true    Inability: Never true  . Transportation needs    Medical: No    Non-medical: No  Tobacco Use  . Smoking status: Former Smoker    Packs/day: 1.00    Years: 12.00    Pack years: 12.00    Types: Cigarettes    Quit date: 03/20/1976    Years since quitting: 42.8  . Smokeless tobacco: Never Used  Substance and Sexual Activity  . Alcohol use: No  . Drug use: No  . Sexual activity: Never  Lifestyle  . Physical activity    Days per week: 0 days    Minutes per session: 0 min  . Stress: Only a little  Relationships  .  Social connections    Talks on phone: More than three times a week    Gets together: Once a week    Attends religious service: More than 4 times per year    Active member of club or organization: No    Attends meetings of clubs or organizations: Never    Relationship status: Never married  . Intimate partner violence    Fear of current or ex partner: No    Emotionally abused: No    Physically abused: No    Forced sexual activity: No  Other Topics Concern  . Not on file  Social History Narrative  . Not on file    FAMILY HISTORY:  Family History  Problem Relation Age of Onset  . Breast cancer Mother   . COPD Mother   . Arthritis Mother   . Diabetes Mother   . Hypertension Mother   . Hypertension  Father   . Diabetes Father   . Breast cancer Sister   . Thyroid cancer Brother   . Huntington's disease Maternal Grandmother   . Heart attack Brother     CURRENT MEDICATIONS:  Outpatient Encounter Medications as of 01/14/2019  Medication Sig  . amLODipine (NORVASC) 5 MG tablet Take 5 mg by mouth every morning.   . Calcium Carbonate Antacid (TUMS PO) Take 4 tablets by mouth daily.   Marland Kitchen CARBOPLATIN IV Inject into the vein once a week.  . Cyanocobalamin (VITAMIN B-12) 2500 MCG SUBL Place 2,500 mcg under the tongue every morning.   . ferrous sulfate 325 (65 FE) MG tablet Take 1 tablet (325 mg total) by mouth 3 (three) times daily after meals. (Patient taking differently: Take 325 mg by mouth daily. )  . lidocaine-prilocaine (EMLA) cream Apply a small amount to port a cath site and cover with plastic wrap 1 hour prior to chemotherapy appointments  . Multiple Vitamin (MULITIVITAMIN WITH MINERALS) TABS Take 1 tablet by mouth every morning.   Marland Kitchen omeprazole (PRILOSEC) 20 MG capsule Take 20 mg by mouth daily.  Marland Kitchen PACLitaxel (TAXOL IV) Inject into the vein once a week.  . gabapentin (NEURONTIN) 300 MG capsule Take 300 mg by mouth at bedtime as needed for pain.  Marland Kitchen ibuprofen (ADVIL,MOTRIN) 800 MG tablet Take 1 tablet (800 mg total) by mouth 3 (three) times daily. (Patient not taking: Reported on 01/14/2019)  . prochlorperazine (COMPAZINE) 10 MG tablet Take 1 tablet (10 mg total) by mouth every 6 (six) hours as needed (Nausea or vomiting). (Patient not taking: Reported on 01/14/2019)   No facility-administered encounter medications on file as of 01/14/2019.     ALLERGIES:  No Known Allergies   PHYSICAL EXAM:  ECOG Performance status: 0  There were no vitals filed for this visit. There were no vitals filed for this visit.  Physical Exam Vitals signs reviewed.  Constitutional:      Appearance: Normal appearance.  Cardiovascular:     Rate and Rhythm: Normal rate and regular rhythm.     Heart  sounds: Normal heart sounds.  Pulmonary:     Effort: Pulmonary effort is normal.     Breath sounds: Normal breath sounds.  Abdominal:     General: There is no distension.     Palpations: Abdomen is soft. There is no mass.  Musculoskeletal:        General: Swelling present.  Lymphadenopathy:     Cervical: No cervical adenopathy.  Skin:    General: Skin is warm.  Neurological:     General:  No focal deficit present.     Mental Status: She is alert and oriented to person, place, and time.  Psychiatric:        Mood and Affect: Mood normal.        Behavior: Behavior normal.      LABORATORY DATA:  I have reviewed the labs as listed.  CBC    Component Value Date/Time   WBC 6.0 01/14/2019 0815   RBC 4.21 01/14/2019 0815   HGB 10.6 (L) 01/14/2019 0815   HCT 35.5 (L) 01/14/2019 0815   PLT 436 (H) 01/14/2019 0815   MCV 84.3 01/14/2019 0815   MCH 25.2 (L) 01/14/2019 0815   MCHC 29.9 (L) 01/14/2019 0815   RDW 15.9 (H) 01/14/2019 0815   LYMPHSABS 0.8 01/14/2019 0815   MONOABS 0.5 01/14/2019 0815   EOSABS 0.1 01/14/2019 0815   BASOSABS 0.0 01/14/2019 0815   CMP Latest Ref Rng & Units 01/14/2019 01/01/2019 12/09/2018  Glucose 70 - 99 mg/dL 112(H) 104(H) 118(H)  BUN 8 - 23 mg/dL 10 10 11   Creatinine 0.44 - 1.00 mg/dL 0.66 0.64 0.64  Sodium 135 - 145 mmol/L 140 141 143  Potassium 3.5 - 5.1 mmol/L 3.6 4.0 4.1  Chloride 98 - 111 mmol/L 109 105 106  CO2 22 - 32 mmol/L 24 28 28   Calcium 8.9 - 10.3 mg/dL 8.5(L) 8.8(L) 9.2  Total Protein 6.5 - 8.1 g/dL 6.8 7.2 7.3  Total Bilirubin 0.3 - 1.2 mg/dL 0.5 0.3 <0.1(L)  Alkaline Phos 38 - 126 U/L 99 101 121  AST 15 - 41 U/L 16 16 22   ALT 0 - 44 U/L 14 7 16        DIAGNOSTIC IMAGING:  I have independently reviewed the scans and discussed with the patient.    ASSESSMENT & PLAN:   Adenocarcinoma of left lung ([place]) 1.  Clinical stage IIIb (T3N2) adenocarcinoma the left lung: -Presentation with hemoptysis, CT chest on 10/17/2018 showing  left upper lobe lung mass, and enlarged AP window lymph node. -CT-guided biopsy on 11/19/2018 consistent with adenocarcinoma. -She smoked 1 pack/day for 15 years, quit 25 years ago. -PET scan on 12/17/2018 showed 7.3 cm left upper lobe mass, 1 cm short axis AP window lymph node with SUV 3.4.  Focus of increased metabolic activity in the cecum without definite CT correlate.  Patient reportedly had colonoscopy 3 to 4 years ago which was normal. -MRI of the brain on 12/16/2018 showed 1.8 x 2.6 x 2.6 cm enhancing extra-axial mass along the left greater wing of sphenoid.  Mass has imaging appearance of meningioma.  However dural based metastatic lesion cannot be excluded. -She had left-sided facial pain 2 weeks ago, which subsided at this time. -We will plan to repeat MRI of the brain in 2 months. -She was started on combination chemoradiation therapy on 01/07/2019 with carboplatin and paclitaxel. -She reported nausea which was severe 2 days after chemotherapy.  She also reported severe diarrhea for 1 day. -She took Compazine which did not help much with nausea.  I will add Emend to the current regimen today. -I have reviewed labs.  We will proceed with week 2 of treatment without any dose modifications. -She will be seen in 1 week prior to her next treatment.   Total time spent is 25 minutes with more than 50% of the time spent face-to-face discussing treatment plan, counseling and coordination of care.  Orders placed this encounter:  No orders of the defined types were placed in this encounter.  Derek Jack, MD [place] [phone]

## 2019-01-14 NOTE — Progress Notes (Signed)
01/14/19  Received request to add Emend to current treatment plan.  Treatment plan has been modified to reflect this addition as well as a decrease in dexamethasone to 12 mg IV added to the Emend.  T.O. Dr Beckey Downing LPN/Draeden Kellman Ronnald Ramp, PharmD

## 2019-01-14 NOTE — Patient Instructions (Signed)
[facility] at [facility] Discharge Instructions  Labs drawn from portacath today   Thank you for choosing [facility] at [facility] to provide your oncology and hematology care.  To afford each patient quality time with our provider, please arrive at least 15 minutes before your scheduled appointment time.   If you have a lab appointment with the Cancer Center please come in thru the Main Entrance and check in at the main information desk.  You need to re-schedule your appointment should you arrive 10 or more minutes late.  We strive to give you quality time with our providers, and arriving late affects you and other patients whose appointments are after yours.  Also, if you no show three or more times for appointments you may be dismissed from the clinic at the providers discretion.     Again, thank you for choosing [facility].  Our hope is that these requests will decrease the amount of time that you wait before being seen by our physicians.       _____________________________________________________________  Should you have questions after your visit to [facility], please contact our office at [phone] between the hours of 8:00 a.m. and 4:30 p.m.  Voicemails left after 4:00 p.m. will not be returned until the following business day.  For prescription refill requests, have your pharmacy contact our office and allow 72 hours.    Due to Covid, you will need to wear a mask upon entering the hospital. If you do not have a mask, a mask will be given to you at the Main Entrance upon arrival. For doctor visits, patients may have 1 support person with them. For treatment visits, patients can not have anyone with them due to social distancing guidelines and our immunocompromised population.     

## 2019-01-14 NOTE — Assessment & Plan Note (Signed)
1.  Clinical stage IIIb (T3N2) adenocarcinoma the left lung: -Presentation with hemoptysis, CT chest on 10/17/2018 showing left upper lobe lung mass, and enlarged AP window lymph node. -CT-guided biopsy on 11/19/2018 consistent with adenocarcinoma. -She smoked 1 pack/day for 15 years, quit 25 years ago. -PET scan on 12/17/2018 showed 7.3 cm left upper lobe mass, 1 cm short axis AP window lymph node with SUV 3.4.  Focus of increased metabolic activity in the cecum without definite CT correlate.  Patient reportedly had colonoscopy 3 to 4 years ago which was normal. -MRI of the brain on 12/16/2018 showed 1.8 x 2.6 x 2.6 cm enhancing extra-axial mass along the left greater wing of sphenoid.  Mass has imaging appearance of meningioma.  However dural based metastatic lesion cannot be excluded. -She had left-sided facial pain 2 weeks ago, which subsided at this time. -We will plan to repeat MRI of the brain in 2 months. -She was started on combination chemoradiation therapy on 01/07/2019 with carboplatin and paclitaxel. -She reported nausea which was severe 2 days after chemotherapy.  She also reported severe diarrhea for 1 day. -She took Compazine which did not help much with nausea.  I will add Emend to the current regimen today. -I have reviewed labs.  We will proceed with week 2 of treatment without any dose modifications. -She will be seen in 1 week prior to her next treatment.

## 2019-01-14 NOTE — Patient Instructions (Signed)
[facility] Discharge Instructions for Patients Receiving Chemotherapy   Beginning January 23rd 2017 lab work for the Cancer Center will be done in the  Main lab at [facility] on 1st floor. If you have a lab appointment with the Cancer Center please come in thru the  Main Entrance and check in at the main information desk   Today you received the following chemotherapy agents Taxol and Carboplatin. Follow-up as scheduled. Call clinic for any questions or concerns  To help prevent nausea and vomiting after your treatment, we encourage you to take your nausea medication.   If you develop nausea and vomiting, or diarrhea that is not controlled by your medication, call the clinic.  The clinic phone number is [phone]. Office hours are Monday-Friday 8:30am-5:00pm.  BELOW ARE SYMPTOMS THAT SHOULD BE REPORTED IMMEDIATELY:  *FEVER GREATER THAN 101.0 F  *CHILLS WITH OR WITHOUT FEVER  NAUSEA AND VOMITING THAT IS NOT CONTROLLED WITH YOUR NAUSEA MEDICATION  *UNUSUAL SHORTNESS OF BREATH  *UNUSUAL BRUISING OR BLEEDING  TENDERNESS IN MOUTH AND THROAT WITH OR WITHOUT PRESENCE OF ULCERS  *URINARY PROBLEMS  *BOWEL PROBLEMS  UNUSUAL RASH Items with * indicate a potential emergency and should be followed up as soon as possible. If you have an emergency after office hours please contact your primary care physician or go to the nearest emergency department.  Please call the clinic during office hours if you have any questions or concerns.   You may also contact the Patient Navigator at [phone] should you have any questions or need assistance in obtaining follow up care.      Resources For Cancer Patients and their Caregivers ? American Cancer Society: Can assist with transportation, wigs, general needs, runs Look Good Feel Better.        1-888-227-6333 ? Cancer Care: Provides financial assistance, online support groups, medication/co-pay assistance.   1-800-813-HOPE (4673) ? [facility] Assists [place] cancer patients and their families through emotional , educational and financial support.  336-427-4357 ? [place] DSS Where to apply for food stamps, Medicaid and utility assistance. 336-342-1394 ? [facility]: Transportation to medical appointments. 336-347-2287 ? Social Security Administration: May apply for disability if have a Stage IV cancer. 336-342-7796 1-800-772-1213 ? [place] Aging, Disability and Transit Services: Assists with nutrition, care and transit needs. 336-349-2343         

## 2019-01-15 ENCOUNTER — Ambulatory Visit
Admission: RE | Admit: 2019-01-15 | Discharge: 2019-01-15 | Disposition: A | Discharge status: Home / Self Care | Payer: Medicare Other | Source: Ambulatory Visit | Attending: Radiation Oncology | Admitting: Radiation Oncology

## 2019-01-15 ENCOUNTER — Other Ambulatory Visit: Payer: Self-pay

## 2019-01-15 DIAGNOSIS — C3492 Malignant neoplasm of unspecified part of left bronchus or lung: Secondary | ICD-10-CM | POA: Diagnosis not present

## 2019-01-15 DIAGNOSIS — C3412 Malignant neoplasm of upper lobe, left bronchus or lung: Secondary | ICD-10-CM | POA: Diagnosis not present

## 2019-01-15 DIAGNOSIS — Z510 Encounter for antineoplastic radiation therapy: Secondary | ICD-10-CM | POA: Diagnosis not present

## 2019-01-16 ENCOUNTER — Other Ambulatory Visit: Payer: Self-pay

## 2019-01-16 ENCOUNTER — Ambulatory Visit
Admission: RE | Admit: 2019-01-16 | Discharge: 2019-01-16 | Disposition: A | Discharge status: Home / Self Care | Payer: Medicare Other | Source: Ambulatory Visit | Attending: Radiation Oncology | Admitting: Radiation Oncology

## 2019-01-16 DIAGNOSIS — Z510 Encounter for antineoplastic radiation therapy: Secondary | ICD-10-CM | POA: Diagnosis not present

## 2019-01-16 DIAGNOSIS — C3492 Malignant neoplasm of unspecified part of left bronchus or lung: Secondary | ICD-10-CM | POA: Diagnosis not present

## 2019-01-16 DIAGNOSIS — C3412 Malignant neoplasm of upper lobe, left bronchus or lung: Secondary | ICD-10-CM | POA: Diagnosis not present

## 2019-01-17 ENCOUNTER — Ambulatory Visit
Admission: RE | Admit: 2019-01-17 | Discharge: 2019-01-17 | Disposition: A | Discharge status: Home / Self Care | Payer: Medicare Other | Source: Ambulatory Visit | Attending: Radiation Oncology | Admitting: Radiation Oncology

## 2019-01-17 ENCOUNTER — Other Ambulatory Visit: Payer: Self-pay

## 2019-01-17 DIAGNOSIS — C3492 Malignant neoplasm of unspecified part of left bronchus or lung: Secondary | ICD-10-CM | POA: Diagnosis not present

## 2019-01-17 DIAGNOSIS — C3412 Malignant neoplasm of upper lobe, left bronchus or lung: Secondary | ICD-10-CM | POA: Diagnosis not present

## 2019-01-17 DIAGNOSIS — Z510 Encounter for antineoplastic radiation therapy: Secondary | ICD-10-CM | POA: Diagnosis not present

## 2019-01-20 ENCOUNTER — Ambulatory Visit
Admission: RE | Admit: 2019-01-20 | Discharge: 2019-01-20 | Disposition: A | Discharge status: Home / Self Care | Payer: Medicare Other | Source: Ambulatory Visit | Attending: Radiation Oncology | Admitting: Radiation Oncology

## 2019-01-20 ENCOUNTER — Other Ambulatory Visit: Payer: Self-pay

## 2019-01-20 DIAGNOSIS — C3492 Malignant neoplasm of unspecified part of left bronchus or lung: Secondary | ICD-10-CM | POA: Diagnosis not present

## 2019-01-20 DIAGNOSIS — Z510 Encounter for antineoplastic radiation therapy: Secondary | ICD-10-CM | POA: Diagnosis not present

## 2019-01-20 DIAGNOSIS — C3412 Malignant neoplasm of upper lobe, left bronchus or lung: Secondary | ICD-10-CM | POA: Diagnosis not present

## 2019-01-21 ENCOUNTER — Inpatient Hospital Stay (HOSPITAL_BASED_OUTPATIENT_CLINIC_OR_DEPARTMENT_OTHER): Payer: Medicare Other | Admitting: Hematology

## 2019-01-21 ENCOUNTER — Other Ambulatory Visit: Payer: Self-pay

## 2019-01-21 ENCOUNTER — Ambulatory Visit (HOSPITAL_COMMUNITY): Payer: Medicare Other | Admitting: Nurse Practitioner

## 2019-01-21 ENCOUNTER — Ambulatory Visit
Admission: RE | Admit: 2019-01-21 | Discharge: 2019-01-21 | Disposition: A | Discharge status: Home / Self Care | Payer: Medicare Other | Source: Ambulatory Visit | Attending: Radiation Oncology | Admitting: Radiation Oncology

## 2019-01-21 ENCOUNTER — Encounter (HOSPITAL_COMMUNITY): Payer: Self-pay

## 2019-01-21 ENCOUNTER — Inpatient Hospital Stay (HOSPITAL_COMMUNITY): Payer: Medicare Other | Attending: Hematology

## 2019-01-21 ENCOUNTER — Inpatient Hospital Stay (HOSPITAL_COMMUNITY): Payer: Medicare Other

## 2019-01-21 VITALS — BP 136/64 | HR 72 | Temp 96.9°F | Resp 18 | Wt 279.8 lb

## 2019-01-21 DIAGNOSIS — C3412 Malignant neoplasm of upper lobe, left bronchus or lung: Secondary | ICD-10-CM | POA: Insufficient documentation

## 2019-01-21 DIAGNOSIS — D329 Benign neoplasm of meninges, unspecified: Secondary | ICD-10-CM | POA: Diagnosis not present

## 2019-01-21 DIAGNOSIS — C3492 Malignant neoplasm of unspecified part of left bronchus or lung: Secondary | ICD-10-CM

## 2019-01-21 DIAGNOSIS — R110 Nausea: Secondary | ICD-10-CM | POA: Diagnosis not present

## 2019-01-21 DIAGNOSIS — Z95828 Presence of other vascular implants and grafts: Secondary | ICD-10-CM

## 2019-01-21 DIAGNOSIS — E119 Type 2 diabetes mellitus without complications: Secondary | ICD-10-CM | POA: Diagnosis not present

## 2019-01-21 DIAGNOSIS — Z510 Encounter for antineoplastic radiation therapy: Secondary | ICD-10-CM | POA: Diagnosis not present

## 2019-01-21 DIAGNOSIS — Z5111 Encounter for antineoplastic chemotherapy: Secondary | ICD-10-CM | POA: Insufficient documentation

## 2019-01-21 LAB — COMPREHENSIVE METABOLIC PANEL
ALT: 18 U/L (ref 0–44)
AST: 15 U/L (ref 15–41)
Albumin: 3 g/dL — ABNORMAL LOW (ref 3.5–5.0)
Alkaline Phosphatase: 109 U/L (ref 38–126)
Anion gap: 5 (ref 5–15)
BUN: 10 mg/dL (ref 8–23)
CO2: 25 mmol/L (ref 22–32)
Calcium: 8.1 mg/dL — ABNORMAL LOW (ref 8.9–10.3)
Chloride: 111 mmol/L (ref 98–111)
Creatinine, Ser: 0.64 mg/dL (ref 0.44–1.00)
GFR calc Af Amer: >60 mL/min (ref >60)
GFR calc non Af Amer: >60 mL/min (ref >60)
Glucose, Bld: 101 mg/dL — ABNORMAL HIGH (ref 70–99)
Potassium: 3.7 mmol/L (ref 3.5–5.1)
Sodium: 141 mmol/L (ref 135–145)
Total Bilirubin: 0.3 mg/dL (ref 0.3–1.2)
Total Protein: 6.4 g/dL — ABNORMAL LOW (ref 6.5–8.1)

## 2019-01-21 LAB — CBC WITH DIFFERENTIAL/PLATELET
Abs Immature Granulocytes: 0.02 10*3/uL (ref 0.00–0.07)
Basophils Absolute: 0 10*3/uL (ref 0.0–0.1)
Basophils Relative: 1 %
Eosinophils Absolute: 0.1 10*3/uL (ref 0.0–0.5)
Eosinophils Relative: 2 %
HCT: 32.9 % — ABNORMAL LOW (ref 36.0–46.0)
Hemoglobin: 9.9 g/dL — ABNORMAL LOW (ref 12.0–15.0)
Immature Granulocytes: 1 %
Lymphocytes Relative: 14 %
Lymphs Abs: 0.6 10*3/uL — ABNORMAL LOW (ref 0.7–4.0)
MCH: 25.3 pg — ABNORMAL LOW (ref 26.0–34.0)
MCHC: 30.1 g/dL (ref 30.0–36.0)
MCV: 83.9 fL (ref 80.0–100.0)
Monocytes Absolute: 0.3 10*3/uL (ref 0.1–1.0)
Monocytes Relative: 7 %
Neutro Abs: 3.3 10*3/uL (ref 1.7–7.7)
Neutrophils Relative %: 75 %
Platelets: 373 10*3/uL (ref 150–400)
RBC: 3.92 MIL/uL (ref 3.87–5.11)
RDW: 16.3 % — ABNORMAL HIGH (ref 11.5–15.5)
WBC: 4.4 10*3/uL (ref 4.0–10.5)
nRBC: 0 % (ref 0.0–0.2)

## 2019-01-21 MED ORDER — SODIUM CHLORIDE 0.9 % IV SOLN
Freq: Once | INTRAVENOUS | Status: AC
  Administered 2019-01-21: 10:00:00 via INTRAVENOUS
  Filled 2019-01-21: qty 5

## 2019-01-21 MED ORDER — HEPARIN SOD (PORK) LOCK FLUSH 100 UNIT/ML IV SOLN
500.0000 [IU] | Freq: Once | INTRAVENOUS | Status: AC | PRN
  Administered 2019-01-21: 500 [IU]

## 2019-01-21 MED ORDER — SODIUM CHLORIDE 0.9 % IV SOLN
45.0000 mg/m2 | Freq: Once | INTRAVENOUS | Status: AC
  Administered 2019-01-21: 108 mg via INTRAVENOUS
  Filled 2019-01-21: qty 18

## 2019-01-21 MED ORDER — FAMOTIDINE IN NACL 20-0.9 MG/50ML-% IV SOLN
20.0000 mg | Freq: Once | INTRAVENOUS | Status: AC
  Administered 2019-01-21: 20 mg via INTRAVENOUS
  Filled 2019-01-21: qty 50

## 2019-01-21 MED ORDER — OCTREOTIDE ACETATE 30 MG IM KIT
PACK | INTRAMUSCULAR | Status: AC
  Filled 2019-01-21: qty 1

## 2019-01-21 MED ORDER — SODIUM CHLORIDE 0.9 % IV SOLN
266.4000 mg | Freq: Once | INTRAVENOUS | Status: AC
  Administered 2019-01-21: 270 mg via INTRAVENOUS
  Filled 2019-01-21: qty 27

## 2019-01-21 MED ORDER — DIPHENHYDRAMINE HCL 50 MG/ML IJ SOLN
50.0000 mg | Freq: Once | INTRAMUSCULAR | Status: AC
  Administered 2019-01-21: 50 mg via INTRAVENOUS
  Filled 2019-01-21: qty 1

## 2019-01-21 MED ORDER — SODIUM CHLORIDE 0.9 % IV SOLN
Freq: Once | INTRAVENOUS | Status: AC
  Administered 2019-01-21: 09:00:00 via INTRAVENOUS

## 2019-01-21 MED ORDER — PALONOSETRON HCL INJECTION 0.25 MG/5ML
0.2500 mg | Freq: Once | INTRAVENOUS | Status: AC
  Administered 2019-01-21: 0.25 mg via INTRAVENOUS
  Filled 2019-01-21: qty 5

## 2019-01-21 MED ORDER — SODIUM CHLORIDE 0.9% FLUSH
10.0000 mL | INTRAVENOUS | Status: DC | PRN
  Administered 2019-01-21 (×2): 10 mL
  Filled 2019-01-21 (×2): qty 10

## 2019-01-21 NOTE — Patient Instructions (Signed)
[facility] at [facility] Discharge Instructions  Labs drawn from portacath today   Thank you for choosing [facility] at [facility] to provide your oncology and hematology care.  To afford each patient quality time with our provider, please arrive at least 15 minutes before your scheduled appointment time.   If you have a lab appointment with the Cancer Center please come in thru the Main Entrance and check in at the main information desk.  You need to re-schedule your appointment should you arrive 10 or more minutes late.  We strive to give you quality time with our providers, and arriving late affects you and other patients whose appointments are after yours.  Also, if you no show three or more times for appointments you may be dismissed from the clinic at the providers discretion.     Again, thank you for choosing [facility].  Our hope is that these requests will decrease the amount of time that you wait before being seen by our physicians.       _____________________________________________________________  Should you have questions after your visit to [facility], please contact our office at [phone] between the hours of 8:00 a.m. and 4:30 p.m.  Voicemails left after 4:00 p.m. will not be returned until the following business day.  For prescription refill requests, have your pharmacy contact our office and allow 72 hours.    Due to Covid, you will need to wear a mask upon entering the hospital. If you do not have a mask, a mask will be given to you at the Main Entrance upon arrival. For doctor visits, patients may have 1 support person with them. For treatment visits, patients can not have anyone with them due to social distancing guidelines and our immunocompromised population.     

## 2019-01-21 NOTE — Progress Notes (Signed)
[place], [place] [address]   CLINIC:  Medical Oncology/Hematology  PCP:  Iona Beard, [address]  [place] [address] [phone]   REASON FOR VISIT:  Follow-up for Lung Cancer   CURRENT THERAPY: Carbo/ taxel   BRIEF ONCOLOGIC HISTORY:  Oncology History  Adenocarcinoma of left lung ([place] [place])  12/09/2018 Initial Diagnosis   Adenocarcinoma of left lung ([facility])   01/02/2019 Cancer Staging   Staging form: Lung, AJCC 8th Edition - Clinical stage from 01/02/2019: Stage IIIB (cT3, cN2, cM0) - Signed by Derek Jack, MD on 01/02/2019   01/07/2019 -  Chemotherapy   The patient had palonosetron (ALOXI) injection 0.25 mg, 0.25 mg, Intravenous,  Once, 3 of 4 cycles Administration: 0.25 mg (01/07/2019), 0.25 mg (01/14/2019) CARBOplatin (PARAPLATIN) 270 mg in sodium chloride 0.9 % 250 mL chemo infusion, 270 mg (100 % of original dose 266.4 mg), Intravenous,  Once, 3 of 4 cycles Dose modification:   (original dose 266.4 mg, Cycle 1),   (original dose 266.4 mg, Cycle 2),   (original dose 266.4 mg, Cycle 3) Administration: 270 mg (01/07/2019), 270 mg (01/14/2019) PACLitaxel (TAXOL) 108 mg in sodium chloride 0.9 % 250 mL chemo infusion (</= 80mg /m2), 45 mg/m2 = 108 mg, Intravenous,  Once, 3 of 4 cycles Administration: 108 mg (01/07/2019), 108 mg (01/14/2019) fosaprepitant (EMEND) 150 mg, dexamethasone (DECADRON) 12 mg in sodium chloride 0.9 % 145 mL IVPB, , Intravenous,  Once, 2 of 3 cycles Administration:  (01/14/2019)  for chemotherapy treatment.       CANCER STAGING: Cancer Staging Adenocarcinoma of left lung [facility]) Staging form: Lung, AJCC 8th Edition - Clinical stage from 01/02/2019: Stage IIIB (cT3, cN2, cM0) - Signed by Derek Jack, MD on 01/02/2019    INTERVAL HISTORY:  Denise [name] 68 y.o. female presents today for follow up. Reports overall doing well. Reports nausea secondary to chemotherapy. Nausea is controlled  with PRN antiemetics. She denies any fevers, chills, night sweats.  Denies any new cough, shortness of breath, or chest pain.  She states she is ready to proceed with treatment today.    REVIEW OF SYSTEMS:  Review of Systems  Constitutional: Positive for fatigue.  HENT:  Negative.   Eyes: Negative.   Respiratory: Negative.   Cardiovascular: Negative.   Gastrointestinal: Positive for nausea. Negative for diarrhea.  Endocrine: Negative.   Genitourinary: Negative.    Musculoskeletal: Positive for arthralgias and myalgias.  Skin: Negative.   Neurological: Negative.   Hematological: Negative.   Psychiatric/Behavioral: Negative.      PAST MEDICAL/SURGICAL HISTORY:  Past Medical History:  Diagnosis Date  . Anemia   . Arthritis   . Diabetes mellitus    diet controlled  . GERD (gastroesophageal reflux disease)   . HTN (hypertension)   . HTN (hypertension)   . Port-A-Cath in place 01/06/2019  . Shortness of breath dyspnea    with exertion   Past Surgical History:  Procedure Laterality Date  . CHOLECYSTECTOMY  1997  . COLONOSCOPY    . GASTRIC BYPASS    . INCISIONAL HERNIA REPAIR  04/11/11  . IR IMAGING GUIDED PORT INSERTION  12/27/2018  . LAPAROSCOPIC SALPINGOOPHERECTOMY    . LAPAROTOMY  04/11/2011   Procedure: EXPLORATORY LAPAROTOMY;  Surgeon: Joyice Faster. Cornett, MD;  Location: [facility];  Service: General;  Laterality: N/A;  closure port hole  . TOTAL HIP ARTHROPLASTY  03/07/2012   Procedure: TOTAL HIP ARTHROPLASTY ANTERIOR APPROACH;  Surgeon: Mauri Pole, MD;  Location: [facility];  Service: Orthopedics;  Laterality: Right;  . TOTAL SHOULDER ARTHROPLASTY Left 01/21/2015  . TOTAL SHOULDER ARTHROPLASTY Left 01/21/2015   Procedure: LEFT TOTAL SHOULDER ARTHROPLASTY;  Surgeon: Justice Britain, MD;  Location: [facility];  Service: Orthopedics;  Laterality: Left;  Marland Kitchen VAGINAL HYSTERECTOMY       SOCIAL HISTORY:  Social History   Socioeconomic History  . Marital status: Single    Spouse name:  Not on file  . Number of children: Not on file  . Years of education: 12th grade  . Highest education level: Not on file  Occupational History  . Occupation: Employed    Employer: [employer]  . Financial resource strain: Not very hard  . Food insecurity    Worry: Never true    Inability: Never true  . Transportation needs    Medical: No    Non-medical: No  Tobacco Use  . Smoking status: Former Smoker    Packs/day: 1.00    Years: 12.00    Pack years: 12.00    Types: Cigarettes    Quit date: 03/20/1976    Years since quitting: 42.8  . Smokeless tobacco: Never Used  Substance and Sexual Activity  . Alcohol use: No  . Drug use: No  . Sexual activity: Never  Lifestyle  . Physical activity    Days per week: 0 days    Minutes per session: 0 min  . Stress: Only a little  Relationships  . Social connections    Talks on phone: More than three times a week    Gets together: Once a week    Attends religious service: More than 4 times per year    Active member of club or organization: No    Attends meetings of clubs or organizations: Never    Relationship status: Never married  . Intimate partner violence    Fear of current or ex partner: No    Emotionally abused: No    Physically abused: No    Forced sexual activity: No  Other Topics Concern  . Not on file  Social History Narrative  . Not on file    FAMILY HISTORY:  Family History  Problem Relation Age of Onset  . Breast cancer Mother   . COPD Mother   . Arthritis Mother   . Diabetes Mother   . Hypertension Mother   . Hypertension Father   . Diabetes Father   . Breast cancer Sister   . Thyroid cancer Brother   . Huntington's disease Maternal Grandmother   . Heart attack Brother     CURRENT MEDICATIONS:  Outpatient Encounter Medications as of 01/21/2019  Medication Sig  . amLODipine (NORVASC) 5 MG tablet Take 5 mg by mouth every morning.   . Calcium Carbonate Antacid (TUMS PO) Take 4  tablets by mouth daily.   Marland Kitchen CARBOPLATIN IV Inject into the vein once a week.  . Cyanocobalamin (VITAMIN B-12) 2500 MCG SUBL Place 2,500 mcg under the tongue every morning.   . ferrous sulfate 325 (65 FE) MG tablet Take 1 tablet (325 mg total) by mouth 3 (three) times daily after meals. (Patient taking differently: Take 325 mg by mouth daily. )  . gabapentin (NEURONTIN) 300 MG capsule Take 300 mg by mouth at bedtime as needed for pain.  Marland Kitchen ibuprofen (ADVIL,MOTRIN) 800 MG tablet Take 1 tablet (800 mg total) by mouth 3 (three) times daily.  Marland Kitchen lidocaine-prilocaine (EMLA) cream Apply a small amount to port a  cath site and cover with plastic wrap 1 hour prior to chemotherapy appointments  . Multiple Vitamin (MULITIVITAMIN WITH MINERALS) TABS Take 1 tablet by mouth every morning.   Marland Kitchen omeprazole (PRILOSEC) 20 MG capsule Take 20 mg by mouth daily.  Marland Kitchen PACLitaxel (TAXOL IV) Inject into the vein once a week.  . prochlorperazine (COMPAZINE) 10 MG tablet Take 1 tablet (10 mg total) by mouth every 6 (six) hours as needed (Nausea or vomiting).   Facility-Administered Encounter Medications as of 01/21/2019  Medication  . [COMPLETED] 0.9 %  sodium chloride infusion  . [COMPLETED] CARBOplatin (PARAPLATIN) 270 mg in sodium chloride 0.9 % 250 mL chemo infusion  . [COMPLETED] diphenhydrAMINE (BENADRYL) injection 50 mg  . [COMPLETED] famotidine (PEPCID) IVPB 20 mg premix  . [COMPLETED] fosaprepitant (EMEND) 150 mg, dexamethasone (DECADRON) 12 mg in sodium chloride 0.9 % 145 mL IVPB  . heparin lock flush 100 unit/mL  . [COMPLETED] PACLitaxel (TAXOL) 108 mg in sodium chloride 0.9 % 250 mL chemo infusion (</= 80mg /m2)  . [COMPLETED] palonosetron (ALOXI) injection 0.25 mg  . sodium chloride flush (NS) 0.9 % injection 10 mL  . octreotide (SANDOSTATIN LAR) 30 MG IM injection    ALLERGIES:  No Known Allergies   PHYSICAL EXAM:  ECOG Performance status: 1  There were no vitals filed for this visit. There were no  vitals filed for this visit.  Physical Exam HENT:     Head: Normocephalic.     Right Ear: External ear normal.     Left Ear: External ear normal.     Nose: Nose normal.     Mouth/Throat:     Pharynx: Oropharynx is clear.  Eyes:     Conjunctiva/sclera: Conjunctivae normal.  Neck:     Musculoskeletal: Normal range of motion.  Cardiovascular:     Rate and Rhythm: Normal rate and regular rhythm.     Pulses: Normal pulses.     Heart sounds: Normal heart sounds.  Pulmonary:     Effort: Pulmonary effort is normal.     Breath sounds: Normal breath sounds.  Abdominal:     General: Bowel sounds are normal.  Musculoskeletal: Normal range of motion.  Skin:    General: Skin is warm.  Neurological:     General: No focal deficit present.     Mental Status: She is alert and oriented to person, place, and time.  Psychiatric:        Mood and Affect: Mood normal.        Behavior: Behavior normal.        Thought Content: Thought content normal.        Judgment: Judgment normal.      LABORATORY DATA:  I have reviewed the labs as listed.  CBC    Component Value Date/Time   WBC 4.4 01/21/2019 0750   RBC 3.92 01/21/2019 0750   HGB 9.9 (L) 01/21/2019 0750   HCT 32.9 (L) 01/21/2019 0750   PLT 373 01/21/2019 0750   MCV 83.9 01/21/2019 0750   MCH 25.3 (L) 01/21/2019 0750   MCHC 30.1 01/21/2019 0750   RDW 16.3 (H) 01/21/2019 0750   LYMPHSABS 0.6 (L) 01/21/2019 0750   MONOABS 0.3 01/21/2019 0750   EOSABS 0.1 01/21/2019 0750   BASOSABS 0.0 01/21/2019 0750   CMP Latest Ref Rng & Units 01/21/2019 01/14/2019 01/01/2019  Glucose 70 - 99 mg/dL 101(H) 112(H) 104(H)  BUN 8 - 23 mg/dL 10 10 10   Creatinine 0.44 - 1.00 mg/dL 0.64 0.66 0.64  Sodium  135 - 145 mmol/L 141 140 141  Potassium 3.5 - 5.1 mmol/L 3.7 3.6 4.0  Chloride 98 - 111 mmol/L 111 109 105  CO2 22 - 32 mmol/L 25 24 28   Calcium 8.9 - 10.3 mg/dL 8.1(L) 8.5(L) 8.8(L)  Total Protein 6.5 - 8.1 g/dL 6.4(L) 6.8 7.2  Total Bilirubin 0.3  - 1.2 mg/dL 0.3 0.5 0.3  Alkaline Phos 38 - 126 U/L 109 99 101  AST 15 - 41 U/L 15 16 16   ALT 0 - 44 U/L 18 14 7        ASSESSMENT & PLAN:   Adenocarcinoma of left lung ([facility]) 1.  Clinical stage IIIb (T3N2) adenocarcinoma the left lung: -Presentation with hemoptysis, CT chest on 10/17/2018 showing left upper lobe lung mass, and enlarged AP window lymph node. -CT-guided biopsy on 11/19/2018 consistent with adenocarcinoma. -She smoked 1 pack/day for 15 years, quit 25 years ago. -PET scan on 12/17/2018 showed 7.3 cm left upper lobe mass, 1 cm short axis AP window lymph node with SUV 3.4.  Focus of increased metabolic activity in the cecum without definite CT correlate.  Patient reportedly had colonoscopy 3 to 4 years ago which was normal. -MRI of the brain on 12/16/2018 showed 1.8 x 2.6 x 2.6 cm enhancing extra-axial mass along the left greater wing of sphenoid.  Mass has imaging appearance of meningioma.  However dural based metastatic lesion cannot be excluded. -She had left-sided facial pain 2 weeks ago, which subsided at this time. -We will plan to repeat MRI of the brain in 2 months. -She was started on combination chemoradiation therapy on 01/07/2019 with carboplatin and paclitaxel. -Added Emend to the regimen due to nausea. -Labs are acceptable to proceed with Cycle 3 today. -She will return to clinic in 1 week.   2.  Nausea secondary to chemotherapy -Recommend patient continue Compazine every 6 hours as needed.         [place] [phone]

## 2019-01-21 NOTE — Progress Notes (Signed)
Treatment given per orders. Patient tolerated it well without problems. Vitals stable and discharged home from clinic ambulatory. Follow up as scheduled.  

## 2019-01-21 NOTE — Assessment & Plan Note (Signed)
1.  Clinical stage IIIb (T3N2) adenocarcinoma the left lung: -Presentation with hemoptysis, CT chest on 10/17/2018 showing left upper lobe lung mass, and enlarged AP window lymph node. -CT-guided biopsy on 11/19/2018 consistent with adenocarcinoma. -She smoked 1 pack/day for 15 years, quit 25 years ago. -PET scan on 12/17/2018 showed 7.3 cm left upper lobe mass, 1 cm short axis AP window lymph node with SUV 3.4.  Focus of increased metabolic activity in the cecum without definite CT correlate.  Patient reportedly had colonoscopy 3 to 4 years ago which was normal. -MRI of the brain on 12/16/2018 showed 1.8 x 2.6 x 2.6 cm enhancing extra-axial mass along the left greater wing of sphenoid.  Mass has imaging appearance of meningioma.  However dural based metastatic lesion cannot be excluded. -She had left-sided facial pain 2 weeks ago, which subsided at this time. -We will plan to repeat MRI of the brain in 2 months. -She was started on combination chemoradiation therapy on 01/07/2019 with carboplatin and paclitaxel. -Added Emend to the regimen due to nausea. -Labs are acceptable to proceed with Cycle 3 today. -She will return to clinic in 1 week.   2.  Nausea secondary to chemotherapy -Recommend patient continue Compazine every 6 hours as needed.

## 2019-01-21 NOTE — Patient Instructions (Signed)
[facility] Discharge Instructions for Patients Receiving Chemotherapy  Today you received the following chemotherapy agents   To help prevent nausea and vomiting after your treatment, we encourage you to take your nausea medication   If you develop nausea and vomiting that is not controlled by your nausea medication, call the clinic.   BELOW ARE SYMPTOMS THAT SHOULD BE REPORTED IMMEDIATELY:  *FEVER GREATER THAN 100.5 F  *CHILLS WITH OR WITHOUT FEVER  NAUSEA AND VOMITING THAT IS NOT CONTROLLED WITH YOUR NAUSEA MEDICATION  *UNUSUAL SHORTNESS OF BREATH  *UNUSUAL BRUISING OR BLEEDING  TENDERNESS IN MOUTH AND THROAT WITH OR WITHOUT PRESENCE OF ULCERS  *URINARY PROBLEMS  *BOWEL PROBLEMS  UNUSUAL RASH Items with * indicate a potential emergency and should be followed up as soon as possible.  Feel free to call the clinic should you have any questions or concerns. The clinic phone number is [phone].  Please show the CHEMO ALERT CARD at check-in to the Emergency Department and triage nurse.   

## 2019-01-21 NOTE — Progress Notes (Signed)
0910 Labs reviewed with and pt seen by R.Nester NP and p[t approved for chemo tx today per NP

## 2019-01-22 ENCOUNTER — Other Ambulatory Visit: Payer: Self-pay

## 2019-01-22 ENCOUNTER — Ambulatory Visit
Admission: RE | Admit: 2019-01-22 | Discharge: 2019-01-22 | Disposition: A | Discharge status: Home / Self Care | Payer: Medicare Other | Source: Ambulatory Visit | Attending: Radiation Oncology | Admitting: Radiation Oncology

## 2019-01-22 DIAGNOSIS — Z510 Encounter for antineoplastic radiation therapy: Secondary | ICD-10-CM | POA: Diagnosis not present

## 2019-01-22 DIAGNOSIS — C3492 Malignant neoplasm of unspecified part of left bronchus or lung: Secondary | ICD-10-CM | POA: Diagnosis not present

## 2019-01-22 DIAGNOSIS — C3412 Malignant neoplasm of upper lobe, left bronchus or lung: Secondary | ICD-10-CM | POA: Diagnosis not present

## 2019-01-23 ENCOUNTER — Ambulatory Visit
Admission: RE | Admit: 2019-01-23 | Discharge: 2019-01-23 | Disposition: A | Discharge status: Home / Self Care | Payer: Medicare Other | Source: Ambulatory Visit | Attending: Radiation Oncology | Admitting: Radiation Oncology

## 2019-01-23 ENCOUNTER — Other Ambulatory Visit: Payer: Self-pay

## 2019-01-23 DIAGNOSIS — Z510 Encounter for antineoplastic radiation therapy: Secondary | ICD-10-CM | POA: Diagnosis not present

## 2019-01-23 DIAGNOSIS — C3492 Malignant neoplasm of unspecified part of left bronchus or lung: Secondary | ICD-10-CM | POA: Diagnosis not present

## 2019-01-23 DIAGNOSIS — C3412 Malignant neoplasm of upper lobe, left bronchus or lung: Secondary | ICD-10-CM | POA: Diagnosis not present

## 2019-01-24 ENCOUNTER — Ambulatory Visit
Admission: RE | Admit: 2019-01-24 | Discharge: 2019-01-24 | Disposition: A | Discharge status: Home / Self Care | Payer: Medicare Other | Source: Ambulatory Visit | Attending: Radiation Oncology | Admitting: Radiation Oncology

## 2019-01-24 ENCOUNTER — Other Ambulatory Visit: Payer: Self-pay

## 2019-01-24 DIAGNOSIS — Z510 Encounter for antineoplastic radiation therapy: Secondary | ICD-10-CM | POA: Diagnosis not present

## 2019-01-24 DIAGNOSIS — C3492 Malignant neoplasm of unspecified part of left bronchus or lung: Secondary | ICD-10-CM | POA: Diagnosis not present

## 2019-01-24 DIAGNOSIS — C3412 Malignant neoplasm of upper lobe, left bronchus or lung: Secondary | ICD-10-CM | POA: Diagnosis not present

## 2019-01-27 ENCOUNTER — Other Ambulatory Visit: Payer: Self-pay

## 2019-01-27 ENCOUNTER — Ambulatory Visit
Admission: RE | Admit: 2019-01-27 | Discharge: 2019-01-27 | Disposition: A | Discharge status: Home / Self Care | Payer: Medicare Other | Source: Ambulatory Visit | Attending: Radiation Oncology | Admitting: Radiation Oncology

## 2019-01-27 DIAGNOSIS — C3412 Malignant neoplasm of upper lobe, left bronchus or lung: Secondary | ICD-10-CM | POA: Diagnosis not present

## 2019-01-27 DIAGNOSIS — Z510 Encounter for antineoplastic radiation therapy: Secondary | ICD-10-CM | POA: Diagnosis not present

## 2019-01-27 DIAGNOSIS — C3492 Malignant neoplasm of unspecified part of left bronchus or lung: Secondary | ICD-10-CM | POA: Diagnosis not present

## 2019-01-28 ENCOUNTER — Inpatient Hospital Stay (HOSPITAL_COMMUNITY): Payer: Medicare Other

## 2019-01-28 ENCOUNTER — Other Ambulatory Visit: Payer: Self-pay

## 2019-01-28 ENCOUNTER — Ambulatory Visit
Admission: RE | Admit: 2019-01-28 | Discharge: 2019-01-28 | Disposition: A | Discharge status: Home / Self Care | Payer: Medicare Other | Source: Ambulatory Visit | Attending: Radiation Oncology | Admitting: Radiation Oncology

## 2019-01-28 ENCOUNTER — Inpatient Hospital Stay (HOSPITAL_BASED_OUTPATIENT_CLINIC_OR_DEPARTMENT_OTHER): Payer: Medicare Other | Admitting: Hematology

## 2019-01-28 ENCOUNTER — Encounter (HOSPITAL_COMMUNITY): Payer: Self-pay | Admitting: Hematology

## 2019-01-28 VITALS — BP 128/69 | HR 81 | Temp 97.7°F | Resp 18

## 2019-01-28 DIAGNOSIS — R110 Nausea: Secondary | ICD-10-CM | POA: Diagnosis not present

## 2019-01-28 DIAGNOSIS — Z5111 Encounter for antineoplastic chemotherapy: Secondary | ICD-10-CM | POA: Diagnosis not present

## 2019-01-28 DIAGNOSIS — C3412 Malignant neoplasm of upper lobe, left bronchus or lung: Secondary | ICD-10-CM | POA: Diagnosis not present

## 2019-01-28 DIAGNOSIS — C3492 Malignant neoplasm of unspecified part of left bronchus or lung: Secondary | ICD-10-CM | POA: Diagnosis not present

## 2019-01-28 DIAGNOSIS — D329 Benign neoplasm of meninges, unspecified: Secondary | ICD-10-CM | POA: Diagnosis not present

## 2019-01-28 DIAGNOSIS — Z95828 Presence of other vascular implants and grafts: Secondary | ICD-10-CM

## 2019-01-28 DIAGNOSIS — E119 Type 2 diabetes mellitus without complications: Secondary | ICD-10-CM | POA: Diagnosis not present

## 2019-01-28 DIAGNOSIS — Z510 Encounter for antineoplastic radiation therapy: Secondary | ICD-10-CM | POA: Diagnosis not present

## 2019-01-28 LAB — CBC WITH DIFFERENTIAL/PLATELET
Abs Immature Granulocytes: 0.01 10*3/uL (ref 0.00–0.07)
Basophils Absolute: 0 10*3/uL (ref 0.0–0.1)
Basophils Relative: 0 %
Eosinophils Absolute: 0.1 10*3/uL (ref 0.0–0.5)
Eosinophils Relative: 2 %
HCT: 33.5 % — ABNORMAL LOW (ref 36.0–46.0)
Hemoglobin: 10 g/dL — ABNORMAL LOW (ref 12.0–15.0)
Immature Granulocytes: 0 %
Lymphocytes Relative: 13 %
Lymphs Abs: 0.5 10*3/uL — ABNORMAL LOW (ref 0.7–4.0)
MCH: 25.4 pg — ABNORMAL LOW (ref 26.0–34.0)
MCHC: 29.9 g/dL — ABNORMAL LOW (ref 30.0–36.0)
MCV: 85 fL (ref 80.0–100.0)
Monocytes Absolute: 0.3 10*3/uL (ref 0.1–1.0)
Monocytes Relative: 9 %
Neutro Abs: 2.8 10*3/uL (ref 1.7–7.7)
Neutrophils Relative %: 76 %
Platelets: 296 10*3/uL (ref 150–400)
RBC: 3.94 MIL/uL (ref 3.87–5.11)
RDW: 17.1 % — ABNORMAL HIGH (ref 11.5–15.5)
WBC: 3.7 10*3/uL — ABNORMAL LOW (ref 4.0–10.5)
nRBC: 0 % (ref 0.0–0.2)

## 2019-01-28 LAB — COMPREHENSIVE METABOLIC PANEL
ALT: 18 U/L (ref 0–44)
AST: 17 U/L (ref 15–41)
Albumin: 3.1 g/dL — ABNORMAL LOW (ref 3.5–5.0)
Alkaline Phosphatase: 115 U/L (ref 38–126)
Anion gap: 8 (ref 5–15)
BUN: 9 mg/dL (ref 8–23)
CO2: 25 mmol/L (ref 22–32)
Calcium: 8.5 mg/dL — ABNORMAL LOW (ref 8.9–10.3)
Chloride: 110 mmol/L (ref 98–111)
Creatinine, Ser: 0.71 mg/dL (ref 0.44–1.00)
GFR calc Af Amer: >60 mL/min (ref >60)
GFR calc non Af Amer: >60 mL/min (ref >60)
Glucose, Bld: 117 mg/dL — ABNORMAL HIGH (ref 70–99)
Potassium: 4 mmol/L (ref 3.5–5.1)
Sodium: 143 mmol/L (ref 135–145)
Total Bilirubin: 0.2 mg/dL — ABNORMAL LOW (ref 0.3–1.2)
Total Protein: 6.6 g/dL (ref 6.5–8.1)

## 2019-01-28 MED ORDER — SODIUM CHLORIDE 0.9 % IV SOLN
Freq: Once | INTRAVENOUS | Status: AC
  Administered 2019-01-28: 09:00:00 via INTRAVENOUS
  Filled 2019-01-28: qty 5

## 2019-01-28 MED ORDER — HEPARIN SOD (PORK) LOCK FLUSH 100 UNIT/ML IV SOLN
500.0000 [IU] | Freq: Once | INTRAVENOUS | Status: AC | PRN
  Administered 2019-01-28: 500 [IU]

## 2019-01-28 MED ORDER — FAMOTIDINE IN NACL 20-0.9 MG/50ML-% IV SOLN
20.0000 mg | Freq: Once | INTRAVENOUS | Status: AC
  Administered 2019-01-28: 20 mg via INTRAVENOUS
  Filled 2019-01-28: qty 50

## 2019-01-28 MED ORDER — DIPHENHYDRAMINE HCL 50 MG/ML IJ SOLN
50.0000 mg | Freq: Once | INTRAMUSCULAR | Status: AC
  Administered 2019-01-28: 50 mg via INTRAVENOUS
  Filled 2019-01-28: qty 1

## 2019-01-28 MED ORDER — SODIUM CHLORIDE 0.9% FLUSH
10.0000 mL | INTRAVENOUS | Status: DC | PRN
  Administered 2019-01-28: 10 mL
  Filled 2019-01-28: qty 10

## 2019-01-28 MED ORDER — SODIUM CHLORIDE 0.9 % IV SOLN
Freq: Once | INTRAVENOUS | Status: AC
  Administered 2019-01-28: 09:00:00 via INTRAVENOUS

## 2019-01-28 MED ORDER — PALONOSETRON HCL INJECTION 0.25 MG/5ML
0.2500 mg | Freq: Once | INTRAVENOUS | Status: AC
  Administered 2019-01-28: 0.25 mg via INTRAVENOUS
  Filled 2019-01-28: qty 5

## 2019-01-28 MED ORDER — SODIUM CHLORIDE 0.9 % IV SOLN
266.4000 mg | Freq: Once | INTRAVENOUS | Status: AC
  Administered 2019-01-28: 270 mg via INTRAVENOUS
  Filled 2019-01-28: qty 27

## 2019-01-28 MED ORDER — SODIUM CHLORIDE 0.9 % IV SOLN
45.0000 mg/m2 | Freq: Once | INTRAVENOUS | Status: AC
  Administered 2019-01-28: 108 mg via INTRAVENOUS
  Filled 2019-01-28: qty 18

## 2019-01-28 NOTE — Progress Notes (Signed)
To the room for oncology follow up and treatment.  No complaints voiced today.  No s/s of distress noted.  Reviewed treatment and side effects with no questions asked.    Patient seen by Dr. Delton Coombes with verbal order ok to treat today.   Patient tolerated chemotherapy with no complaints voiced.  Port site clean and dry with no bruising or swelling noted at site.  Good blood return noted before and after administration of chemotherapy.  Band aid applied.  Patient left ambulatory with VSS and no s/s of distress noted.

## 2019-01-28 NOTE — Patient Instructions (Signed)
[facility] Discharge Instructions for Patients Receiving Chemotherapy  Today you received the following chemotherapy agents Carboplatin and Taxol.    To help prevent nausea and vomiting after your treatment, we encourage you to take your nausea medication as directed by Dr. Delton Coombes.    If you develop nausea and vomiting that is not controlled by your nausea medication, call the clinic.   BELOW ARE SYMPTOMS THAT SHOULD BE REPORTED IMMEDIATELY:  *FEVER GREATER THAN 100.5 F  *CHILLS WITH OR WITHOUT FEVER  NAUSEA AND VOMITING THAT IS NOT CONTROLLED WITH YOUR NAUSEA MEDICATION  *UNUSUAL SHORTNESS OF BREATH  *UNUSUAL BRUISING OR BLEEDING  TENDERNESS IN MOUTH AND THROAT WITH OR WITHOUT PRESENCE OF ULCERS  *URINARY PROBLEMS  *BOWEL PROBLEMS  UNUSUAL RASH Items with * indicate a potential emergency and should be followed up as soon as possible.  Feel free to call the clinic should you have any questions or concerns. The clinic phone number is [phone].  Please show the [place] at check-in to the Emergency Department and triage nurse.

## 2019-01-28 NOTE — Assessment & Plan Note (Addendum)
1.  Clinical stage IIIb (T3N2) adenocarcinoma the left lung: -Presentation with hemoptysis, CT chest on 10/17/2018 showed left upper lobe lung mass, and enlarged AP window lymph node. -CT-guided biopsy on 11/19/2018 consistent with adenocarcinoma.  She smoked 1 pack/day for 15 years, quit 25 years ago. -PET scan on 12/17/2018 shows 7.3 cm left upper lobe lung mass, 1 cm short axis AP window lymph node with SUV 3.4.  Focus of increased metabolic activity in the cecum without definite CT correlate.  She reportedly had colonoscopy 3 to 4 years ago which was normal. -Combination chemoradiation therapy with carboplatin and paclitaxel weekly started on 01/07/2019.  Week 3 was on 01/21/2019. -She has developed some tingling in the tips of fingers and toes. -We reviewed her labs.  She will proceed with her week for treatment today.  She will be seen back in 1 week for follow-up.  2.  Meningioma: -MRI of the brain on 12/16/2018 showed 1.8 x 2.6 x 2.6 cm enhancing extra-axial mass along the left greater wing of sphenoid.  Mass has imaging appearance of meningioma.  However dural based metastatic lesion cannot be excluded. -She initially had left-sided facial pain lasting about 3 to 4 weeks.  It subsided.  We will plan to repeat MRI of the brain in 2 months.  3.  Nausea/vomiting: -This has gotten better since we added Emend during week 2.  She had mild nausea after last chemo.  She is taking Compazine twice daily.

## 2019-01-28 NOTE — Patient Instructions (Signed)
[facility] at [facility] Discharge Instructions  You were seen today by Dr. Katragadda. He went over your recent lab results. He will see you back in 1 week for labs, treatment and follow up.   Thank you for choosing [facility] at [facility] to provide your oncology and hematology care.  To afford each patient quality time with our provider, please arrive at least 15 minutes before your scheduled appointment time.   If you have a lab appointment with the Cancer Center please come in thru the  Main Entrance and check in at the main information desk  You need to re-schedule your appointment should you arrive 10 or more minutes late.  We strive to give you quality time with our providers, and arriving late affects you and other patients whose appointments are after yours.  Also, if you no show three or more times for appointments you may be dismissed from the clinic at the providers discretion.     Again, thank you for choosing [facility].  Our hope is that these requests will decrease the amount of time that you wait before being seen by our physicians.       _____________________________________________________________  Should you have questions after your visit to [facility], please contact our office at [phone] between the hours of 8:00 a.m. and 4:30 p.m.  Voicemails left after 4:00 p.m. will not be returned until the following business day.  For prescription refill requests, have your pharmacy contact our office and allow 72 hours.    Cancer Center Support Programs:   > Cancer Support Group  2nd Tuesday of the month 1pm-2pm, Journey Room    

## 2019-01-28 NOTE — Progress Notes (Signed)
[address] [place], [place] [address]   CLINIC:  Medical Oncology/Hematology  PCP:  Iona Beard, MD [address] [place] [address] [phone]   REASON FOR VISIT:  Stage III left lung adenocarcinoma.  CURRENT THERAPY: Weekly carbotaxol with XRT.    INTERVAL HISTORY:  Denise [name] 68 y.o. female seen for follow-up of stage III lung cancer.  She is undergoing combination chemoradiation therapy.  After last treatment she had mild nausea but denied any vomiting.  She is taking Compazine twice daily.  She reported tingling in the tips of toes and fingers.  She also reported some thick phlegm in the back of the throat.  Denies any diarrhea or constipation.  Occasional soreness on the left side of the neck present.  Complains of mild shortness of breath on exertion.   REVIEW OF SYSTEMS:  Review of Systems  Respiratory: Positive for shortness of breath.   Cardiovascular: Positive for leg swelling.  Gastrointestinal: Positive for nausea.  Neurological: Positive for numbness.  All other systems reviewed and are negative.    PAST MEDICAL/SURGICAL HISTORY:  Past Medical History:  Diagnosis Date   Anemia    Arthritis    Diabetes mellitus    diet controlled   GERD (gastroesophageal reflux disease)    HTN (hypertension)    HTN (hypertension)    Port-A-Cath in place 01/06/2019   Shortness of breath dyspnea    with exertion   Past Surgical History:  Procedure Laterality Date   CHOLECYSTECTOMY  1997   COLONOSCOPY     GASTRIC BYPASS     INCISIONAL HERNIA REPAIR  04/11/11   IR IMAGING GUIDED PORT INSERTION  12/27/2018   LAPAROSCOPIC SALPINGOOPHERECTOMY     LAPAROTOMY  04/11/2011   Procedure: EXPLORATORY LAPAROTOMY;  Surgeon: Joyice Faster. Cornett, MD;  Location: [facility];  Service: General;  Laterality: N/A;  closure port hole   TOTAL HIP ARTHROPLASTY  03/07/2012   Procedure: TOTAL HIP ARTHROPLASTY ANTERIOR APPROACH;  Surgeon:  Mauri Pole, MD;  Location: [facility];  Service: Orthopedics;  Laterality: Right;   TOTAL SHOULDER ARTHROPLASTY Left 01/21/2015   TOTAL SHOULDER ARTHROPLASTY Left 01/21/2015   Procedure: LEFT TOTAL SHOULDER ARTHROPLASTY;  Surgeon: Justice Britain, MD;  Location: [facility];  Service: Orthopedics;  Laterality: Left;   VAGINAL HYSTERECTOMY       SOCIAL HISTORY:  Social History   Socioeconomic History   Marital status: Single    Spouse name: Not on file   Number of children: Not on file   Years of education: 12th grade   Highest education level: Not on file  Occupational History   Occupation: Employed    Employer: [employer] resource strain: Not very hard   Food insecurity    Worry: Never true    Inability: Never true   Transportation needs    Medical: No    Non-medical: No  Tobacco Use   Smoking status: Former Smoker    Packs/day: 1.00    Years: 12.00    Pack years: 12.00    Types: Cigarettes    Quit date: 03/20/1976    Years since quitting: 42.8   Smokeless tobacco: Never Used  Substance and Sexual Activity   Alcohol use: No   Drug use: No   Sexual activity: Never  Lifestyle   Physical activity    Days per week: 0 days    Minutes per session: 0 min  Stress: Only a little  Relationships   Social connections    Talks on phone: More than three times a week    Gets together: Once a week    Attends religious service: More than 4 times per year    Active member of club or organization: No    Attends meetings of clubs or organizations: Never    Relationship status: Never married   Intimate partner violence    Fear of current or ex partner: No    Emotionally abused: No    Physically abused: No    Forced sexual activity: No  Other Topics Concern   Not on file  Social History Narrative   Not on file    FAMILY HISTORY:  Family History  Problem Relation Age of Onset   Breast cancer Mother    COPD Mother     Arthritis Mother    Diabetes Mother    Hypertension Mother    Hypertension Father    Diabetes Father    Breast cancer Sister    Thyroid cancer Brother    Huntington's disease Maternal Grandmother    Heart attack Brother     CURRENT MEDICATIONS:  Outpatient Encounter Medications as of 01/28/2019  Medication Sig   amLODipine (NORVASC) 5 MG tablet Take 5 mg by mouth every morning.    CARBOPLATIN IV Inject into the vein once a week.   Cyanocobalamin (VITAMIN B-12) 2500 MCG SUBL Place 2,500 mcg under the tongue every morning.    ferrous sulfate 325 (65 FE) MG tablet Take 1 tablet (325 mg total) by mouth 3 (three) times daily after meals. (Patient taking differently: Take 325 mg by mouth daily. )   Multiple Vitamin (MULITIVITAMIN WITH MINERALS) TABS Take 1 tablet by mouth every morning.    omeprazole (PRILOSEC) 20 MG capsule Take 20 mg by mouth daily.   PACLitaxel (TAXOL IV) Inject into the vein once a week.   prochlorperazine (COMPAZINE) 10 MG tablet Take 1 tablet (10 mg total) by mouth every 6 (six) hours as needed (Nausea or vomiting).   Calcium Carbonate Antacid (TUMS PO) Take 4 tablets by mouth daily.    gabapentin (NEURONTIN) 300 MG capsule Take 300 mg by mouth at bedtime as needed for pain.   ibuprofen (ADVIL,MOTRIN) 800 MG tablet Take 1 tablet (800 mg total) by mouth 3 (three) times daily. (Patient not taking: Reported on 01/28/2019)   lidocaine-prilocaine (EMLA) cream Apply a small amount to port a cath site and cover with plastic wrap 1 hour prior to chemotherapy appointments (Patient not taking: Reported on 01/28/2019)   No facility-administered encounter medications on file as of 01/28/2019.     ALLERGIES:  No Known Allergies   PHYSICAL EXAM:  ECOG Performance status: 0  Vitals:   01/28/19 0802 01/28/19 0810  BP: 134/73 134/73  Pulse: 81 81  Resp: 18 18  Temp: (!) 97.1 F (36.2 C) (!) 97.1 F (36.2 C)  SpO2: 100% 100%   Filed Weights    01/28/19 0802 01/28/19 0810  Weight: 279 lb 6.4 oz (126.7 kg) 279 lb 6.4 oz (126.7 kg)    Physical Exam Vitals signs reviewed.  Constitutional:      Appearance: Normal appearance.  Cardiovascular:     Rate and Rhythm: Normal rate and regular rhythm.     Heart sounds: Normal heart sounds.  Pulmonary:     Effort: Pulmonary effort is normal.     Breath sounds: Normal breath sounds.  Abdominal:     General: There  is no distension.     Palpations: Abdomen is soft. There is no mass.  Musculoskeletal:        General: Swelling present.  Lymphadenopathy:     Cervical: No cervical adenopathy.  Skin:    General: Skin is warm.  Neurological:     General: No focal deficit present.     Mental Status: She is alert and oriented to person, place, and time.  Psychiatric:        Mood and Affect: Mood normal.        Behavior: Behavior normal.      LABORATORY DATA:  I have reviewed the labs as listed.  CBC    Component Value Date/Time   WBC 3.7 (L) 01/28/2019 0755   RBC 3.94 01/28/2019 0755   HGB 10.0 (L) 01/28/2019 0755   HCT 33.5 (L) 01/28/2019 0755   PLT 296 01/28/2019 0755   MCV 85.0 01/28/2019 0755   MCH 25.4 (L) 01/28/2019 0755   MCHC 29.9 (L) 01/28/2019 0755   RDW 17.1 (H) 01/28/2019 0755   LYMPHSABS 0.5 (L) 01/28/2019 0755   MONOABS 0.3 01/28/2019 0755   EOSABS 0.1 01/28/2019 0755   BASOSABS 0.0 01/28/2019 0755   CMP Latest Ref Rng & Units 01/28/2019 01/21/2019 01/14/2019  Glucose 70 - 99 mg/dL 117(H) 101(H) 112(H)  BUN 8 - 23 mg/dL 9 10 10   Creatinine 0.44 - 1.00 mg/dL 0.71 0.64 0.66  Sodium 135 - 145 mmol/L 143 141 140  Potassium 3.5 - 5.1 mmol/L 4.0 3.7 3.6  Chloride 98 - 111 mmol/L 110 111 109  CO2 22 - 32 mmol/L 25 25 24   Calcium 8.9 - 10.3 mg/dL 8.5(L) 8.1(L) 8.5(L)  Total Protein 6.5 - 8.1 g/dL 6.6 6.4(L) 6.8  Total Bilirubin 0.3 - 1.2 mg/dL 0.2(L) 0.3 0.5  Alkaline Phos 38 - 126 U/L 115 109 99  AST 15 - 41 U/L 17 15 16   ALT 0 - 44 U/L 18 18 14         DIAGNOSTIC IMAGING:  I have independently reviewed the scans and discussed with the patient.    ASSESSMENT & PLAN:   Adenocarcinoma of left lung ([place]) 1.  Clinical stage IIIb (T3N2) adenocarcinoma the left lung: -Presentation with hemoptysis, CT chest on 10/17/2018 showed left upper lobe lung mass, and enlarged AP window lymph node. -CT-guided biopsy on 11/19/2018 consistent with adenocarcinoma.  She smoked 1 pack/day for 15 years, quit 25 years ago. -PET scan on 12/17/2018 shows 7.3 cm left upper lobe lung mass, 1 cm short axis AP window lymph node with SUV 3.4.  Focus of increased metabolic activity in the cecum without definite CT correlate.  She reportedly had colonoscopy 3 to 4 years ago which was normal. -Combination chemoradiation therapy with carboplatin and paclitaxel weekly started on 01/07/2019.  Week 3 was on 01/21/2019. -She has developed some tingling in the tips of fingers and toes. -We reviewed her labs.  She will proceed with her week for treatment today.  She will be seen back in 1 week for follow-up.  2.  Meningioma: -MRI of the brain on 12/16/2018 showed 1.8 x 2.6 x 2.6 cm enhancing extra-axial mass along the left greater wing of sphenoid.  Mass has imaging appearance of meningioma.  However dural based metastatic lesion cannot be excluded. -She initially had left-sided facial pain lasting about 3 to 4 weeks.  It subsided.  We will plan to repeat MRI of the brain in 2 months.  3.  Nausea/vomiting: -This has gotten better  since we added Emend during week 2.  She had mild nausea after last chemo.  She is taking Compazine twice daily.   Total time spent is 25 minutes with more than 50% of the time spent face-to-face discussing treatment plan, counseling and coordination of care.  Orders placed this encounter:  No orders of the defined types were placed in this encounter.     Derek Jack, MD [place] [phone]

## 2019-01-29 ENCOUNTER — Ambulatory Visit
Admission: RE | Admit: 2019-01-29 | Discharge: 2019-01-29 | Disposition: A | Discharge status: Home / Self Care | Payer: Medicare Other | Source: Ambulatory Visit | Attending: Radiation Oncology | Admitting: Radiation Oncology

## 2019-01-29 ENCOUNTER — Other Ambulatory Visit: Payer: Self-pay

## 2019-01-29 DIAGNOSIS — Z510 Encounter for antineoplastic radiation therapy: Secondary | ICD-10-CM | POA: Diagnosis not present

## 2019-01-29 DIAGNOSIS — C3412 Malignant neoplasm of upper lobe, left bronchus or lung: Secondary | ICD-10-CM | POA: Diagnosis not present

## 2019-01-29 DIAGNOSIS — C3492 Malignant neoplasm of unspecified part of left bronchus or lung: Secondary | ICD-10-CM | POA: Diagnosis not present

## 2019-01-30 ENCOUNTER — Ambulatory Visit
Admission: RE | Admit: 2019-01-30 | Discharge: 2019-01-30 | Disposition: A | Discharge status: Home / Self Care | Payer: Medicare Other | Source: Ambulatory Visit | Attending: Radiation Oncology | Admitting: Radiation Oncology

## 2019-01-30 ENCOUNTER — Other Ambulatory Visit: Payer: Self-pay

## 2019-01-30 DIAGNOSIS — Z510 Encounter for antineoplastic radiation therapy: Secondary | ICD-10-CM | POA: Diagnosis not present

## 2019-01-30 DIAGNOSIS — C3492 Malignant neoplasm of unspecified part of left bronchus or lung: Secondary | ICD-10-CM | POA: Diagnosis not present

## 2019-01-30 DIAGNOSIS — C3412 Malignant neoplasm of upper lobe, left bronchus or lung: Secondary | ICD-10-CM | POA: Diagnosis not present

## 2019-01-31 ENCOUNTER — Other Ambulatory Visit: Payer: Self-pay

## 2019-01-31 ENCOUNTER — Ambulatory Visit
Admission: RE | Admit: 2019-01-31 | Discharge: 2019-01-31 | Disposition: A | Discharge status: Home / Self Care | Payer: Medicare Other | Source: Ambulatory Visit | Attending: Radiation Oncology | Admitting: Radiation Oncology

## 2019-01-31 DIAGNOSIS — Z510 Encounter for antineoplastic radiation therapy: Secondary | ICD-10-CM | POA: Diagnosis not present

## 2019-01-31 DIAGNOSIS — C3492 Malignant neoplasm of unspecified part of left bronchus or lung: Secondary | ICD-10-CM | POA: Diagnosis not present

## 2019-01-31 DIAGNOSIS — C3412 Malignant neoplasm of upper lobe, left bronchus or lung: Secondary | ICD-10-CM | POA: Diagnosis not present

## 2019-02-03 ENCOUNTER — Ambulatory Visit
Admission: RE | Admit: 2019-02-03 | Discharge: 2019-02-03 | Disposition: A | Discharge status: Home / Self Care | Payer: Medicare Other | Source: Ambulatory Visit | Attending: Radiation Oncology | Admitting: Radiation Oncology

## 2019-02-03 ENCOUNTER — Other Ambulatory Visit: Payer: Self-pay

## 2019-02-03 DIAGNOSIS — I10 Essential (primary) hypertension: Secondary | ICD-10-CM | POA: Diagnosis not present

## 2019-02-03 DIAGNOSIS — C3412 Malignant neoplasm of upper lobe, left bronchus or lung: Secondary | ICD-10-CM | POA: Diagnosis not present

## 2019-02-03 DIAGNOSIS — R7303 Prediabetes: Secondary | ICD-10-CM | POA: Diagnosis not present

## 2019-02-03 DIAGNOSIS — C3492 Malignant neoplasm of unspecified part of left bronchus or lung: Secondary | ICD-10-CM | POA: Diagnosis not present

## 2019-02-03 DIAGNOSIS — Z510 Encounter for antineoplastic radiation therapy: Secondary | ICD-10-CM | POA: Diagnosis not present

## 2019-02-03 DIAGNOSIS — C3490 Malignant neoplasm of unspecified part of unspecified bronchus or lung: Secondary | ICD-10-CM | POA: Diagnosis not present

## 2019-02-04 ENCOUNTER — Ambulatory Visit
Admission: RE | Admit: 2019-02-04 | Discharge: 2019-02-04 | Disposition: A | Discharge status: Home / Self Care | Payer: Medicare Other | Source: Ambulatory Visit | Attending: Radiation Oncology | Admitting: Radiation Oncology

## 2019-02-04 ENCOUNTER — Other Ambulatory Visit: Payer: Self-pay

## 2019-02-04 ENCOUNTER — Inpatient Hospital Stay (HOSPITAL_COMMUNITY): Payer: Medicare Other

## 2019-02-04 ENCOUNTER — Inpatient Hospital Stay (HOSPITAL_BASED_OUTPATIENT_CLINIC_OR_DEPARTMENT_OTHER): Payer: Medicare Other | Admitting: Hematology

## 2019-02-04 ENCOUNTER — Encounter (HOSPITAL_COMMUNITY): Payer: Self-pay | Admitting: Hematology

## 2019-02-04 VITALS — BP 144/71 | HR 67 | Temp 97.3°F | Resp 20 | Wt 277.2 lb

## 2019-02-04 VITALS — BP 141/76 | HR 72 | Temp 96.9°F | Resp 20

## 2019-02-04 DIAGNOSIS — Z95828 Presence of other vascular implants and grafts: Secondary | ICD-10-CM

## 2019-02-04 DIAGNOSIS — D329 Benign neoplasm of meninges, unspecified: Secondary | ICD-10-CM | POA: Diagnosis not present

## 2019-02-04 DIAGNOSIS — C3412 Malignant neoplasm of upper lobe, left bronchus or lung: Secondary | ICD-10-CM | POA: Diagnosis not present

## 2019-02-04 DIAGNOSIS — C3492 Malignant neoplasm of unspecified part of left bronchus or lung: Secondary | ICD-10-CM

## 2019-02-04 DIAGNOSIS — Z510 Encounter for antineoplastic radiation therapy: Secondary | ICD-10-CM | POA: Diagnosis not present

## 2019-02-04 DIAGNOSIS — R110 Nausea: Secondary | ICD-10-CM | POA: Diagnosis not present

## 2019-02-04 DIAGNOSIS — Z5111 Encounter for antineoplastic chemotherapy: Secondary | ICD-10-CM | POA: Diagnosis not present

## 2019-02-04 DIAGNOSIS — E119 Type 2 diabetes mellitus without complications: Secondary | ICD-10-CM | POA: Diagnosis not present

## 2019-02-04 LAB — COMPREHENSIVE METABOLIC PANEL
ALT: 19 U/L (ref 0–44)
AST: 16 U/L (ref 15–41)
Albumin: 3.2 g/dL — ABNORMAL LOW (ref 3.5–5.0)
Alkaline Phosphatase: 113 U/L (ref 38–126)
Anion gap: 8 (ref 5–15)
BUN: 13 mg/dL (ref 8–23)
CO2: 25 mmol/L (ref 22–32)
Calcium: 8.9 mg/dL (ref 8.9–10.3)
Chloride: 108 mmol/L (ref 98–111)
Creatinine, Ser: 0.65 mg/dL (ref 0.44–1.00)
GFR calc Af Amer: >60 mL/min (ref >60)
GFR calc non Af Amer: >60 mL/min (ref >60)
Glucose, Bld: 95 mg/dL (ref 70–99)
Potassium: 4.1 mmol/L (ref 3.5–5.1)
Sodium: 141 mmol/L (ref 135–145)
Total Bilirubin: 0.3 mg/dL (ref 0.3–1.2)
Total Protein: 6.6 g/dL (ref 6.5–8.1)

## 2019-02-04 LAB — CBC WITH DIFFERENTIAL/PLATELET
Abs Immature Granulocytes: 0 10*3/uL (ref 0.00–0.07)
Basophils Absolute: 0 10*3/uL (ref 0.0–0.1)
Basophils Relative: 0 %
Eosinophils Absolute: 0 10*3/uL (ref 0.0–0.5)
Eosinophils Relative: 1 %
HCT: 35.1 % — ABNORMAL LOW (ref 36.0–46.0)
Hemoglobin: 10.5 g/dL — ABNORMAL LOW (ref 12.0–15.0)
Immature Granulocytes: 0 %
Lymphocytes Relative: 14 %
Lymphs Abs: 0.4 10*3/uL — ABNORMAL LOW (ref 0.7–4.0)
MCH: 25.3 pg — ABNORMAL LOW (ref 26.0–34.0)
MCHC: 29.9 g/dL — ABNORMAL LOW (ref 30.0–36.0)
MCV: 84.6 fL (ref 80.0–100.0)
Monocytes Absolute: 0.3 10*3/uL (ref 0.1–1.0)
Monocytes Relative: 10 %
Neutro Abs: 2.2 10*3/uL (ref 1.7–7.7)
Neutrophils Relative %: 75 %
Platelets: 236 10*3/uL (ref 150–400)
RBC: 4.15 MIL/uL (ref 3.87–5.11)
RDW: 17.7 % — ABNORMAL HIGH (ref 11.5–15.5)
WBC: 2.9 10*3/uL — ABNORMAL LOW (ref 4.0–10.5)
nRBC: 0 % (ref 0.0–0.2)

## 2019-02-04 MED ORDER — PALONOSETRON HCL INJECTION 0.25 MG/5ML
INTRAVENOUS | Status: AC
  Filled 2019-02-04: qty 5

## 2019-02-04 MED ORDER — SODIUM CHLORIDE 0.9 % IV SOLN
Freq: Once | INTRAVENOUS | Status: AC
  Administered 2019-02-04: 11:00:00 via INTRAVENOUS
  Filled 2019-02-04: qty 5

## 2019-02-04 MED ORDER — PALONOSETRON HCL INJECTION 0.25 MG/5ML
0.2500 mg | Freq: Once | INTRAVENOUS | Status: AC
  Administered 2019-02-04: 0.25 mg via INTRAVENOUS

## 2019-02-04 MED ORDER — SODIUM CHLORIDE 0.9 % IV SOLN
266.4000 mg | Freq: Once | INTRAVENOUS | Status: AC
  Administered 2019-02-04: 270 mg via INTRAVENOUS
  Filled 2019-02-04: qty 27

## 2019-02-04 MED ORDER — FAMOTIDINE IN NACL 20-0.9 MG/50ML-% IV SOLN
20.0000 mg | Freq: Once | INTRAVENOUS | Status: AC
  Administered 2019-02-04: 20 mg via INTRAVENOUS

## 2019-02-04 MED ORDER — FAMOTIDINE IN NACL 20-0.9 MG/50ML-% IV SOLN
INTRAVENOUS | Status: AC
  Filled 2019-02-04: qty 50

## 2019-02-04 MED ORDER — DIPHENHYDRAMINE HCL 50 MG/ML IJ SOLN
INTRAMUSCULAR | Status: AC
  Filled 2019-02-04: qty 1

## 2019-02-04 MED ORDER — HEPARIN SOD (PORK) LOCK FLUSH 100 UNIT/ML IV SOLN
500.0000 [IU] | Freq: Once | INTRAVENOUS | Status: AC | PRN
  Administered 2019-02-04: 500 [IU]

## 2019-02-04 MED ORDER — SODIUM CHLORIDE 0.9 % IV SOLN
45.0000 mg/m2 | Freq: Once | INTRAVENOUS | Status: AC
  Administered 2019-02-04: 108 mg via INTRAVENOUS
  Filled 2019-02-04: qty 18

## 2019-02-04 MED ORDER — DIPHENHYDRAMINE HCL 50 MG/ML IJ SOLN
50.0000 mg | Freq: Once | INTRAMUSCULAR | Status: AC
  Administered 2019-02-04: 50 mg via INTRAVENOUS

## 2019-02-04 MED ORDER — SODIUM CHLORIDE 0.9 % IV SOLN
Freq: Once | INTRAVENOUS | Status: AC
  Administered 2019-02-04: 11:00:00 via INTRAVENOUS

## 2019-02-04 MED ORDER — SODIUM CHLORIDE 0.9% FLUSH
10.0000 mL | INTRAVENOUS | Status: DC | PRN
  Administered 2019-02-04: 10 mL
  Filled 2019-02-04: qty 10

## 2019-02-04 NOTE — Progress Notes (Signed)
[place], [place] [address]   CLINIC:  Medical Oncology/Hematology  PCP:  Iona Beard, MD [address] [place] [address] [phone]   REASON FOR VISIT:  Stage III left lung adenocarcinoma.  CURRENT THERAPY: Weekly carbotaxol with XRT.    INTERVAL HISTORY:  Ms. Mccance 68 y.o. female seen for follow-up of stage III lung cancer.  She is undergoing combination chemoradiation therapy.  She had mild nausea which was controlled with Compazine.  Numbness in the toes has been on and off and stable.  It has not gotten any worse since last week.  Appetite is 100%.  Energy levels are 25%.  Weight has been stable.  She had minor blood-tinged mucus when she blow her nose.  Denies any fevers or chills.   REVIEW OF SYSTEMS:  Review of Systems  Constitutional: Positive for fatigue.  Respiratory: Positive for shortness of breath.   Cardiovascular: Positive for leg swelling.  Gastrointestinal: Positive for nausea.  Neurological: Positive for numbness.  Psychiatric/Behavioral: Positive for sleep disturbance.  All other systems reviewed and are negative.    PAST MEDICAL/SURGICAL HISTORY:  Past Medical History:  Diagnosis Date  . Anemia   . Arthritis   . Diabetes mellitus    diet controlled  . GERD (gastroesophageal reflux disease)   . HTN (hypertension)   . HTN (hypertension)   . Port-A-Cath in place 01/06/2019  . Shortness of breath dyspnea    with exertion   Past Surgical History:  Procedure Laterality Date  . CHOLECYSTECTOMY  1997  . COLONOSCOPY    . GASTRIC BYPASS    . INCISIONAL HERNIA REPAIR  04/11/11  . IR IMAGING GUIDED PORT INSERTION  12/27/2018  . LAPAROSCOPIC SALPINGOOPHERECTOMY    . LAPAROTOMY  04/11/2011   Procedure: EXPLORATORY LAPAROTOMY;  Surgeon: Joyice Faster. Cornett, MD;  Location: [facility];  Service: General;  Laterality: N/A;  closure port hole  . TOTAL HIP ARTHROPLASTY  03/07/2012   Procedure: TOTAL HIP  ARTHROPLASTY ANTERIOR APPROACH;  Surgeon: Mauri Pole, MD;  Location: [facility];  Service: Orthopedics;  Laterality: Right;  . TOTAL SHOULDER ARTHROPLASTY Left 01/21/2015  . TOTAL SHOULDER ARTHROPLASTY Left 01/21/2015   Procedure: LEFT TOTAL SHOULDER ARTHROPLASTY;  Surgeon: Justice Britain, MD;  Location: [facility];  Service: Orthopedics;  Laterality: Left;  Marland Kitchen VAGINAL HYSTERECTOMY       SOCIAL HISTORY:  Social History   Socioeconomic History  . Marital status: Single    Spouse name: Not on file  . Number of children: Not on file  . Years of education: 12th grade  . Highest education level: Not on file  Occupational History  . Occupation: Employed    Employer: [employer]  . Financial resource strain: Not very hard  . Food insecurity    Worry: Never true    Inability: Never true  . Transportation needs    Medical: No    Non-medical: No  Tobacco Use  . Smoking status: Former Smoker    Packs/day: 1.00    Years: 12.00    Pack years: 12.00    Types: Cigarettes    Quit date: 03/20/1976    Years since quitting: 42.9  . Smokeless tobacco: Never Used  Substance and Sexual Activity  . Alcohol use: No  . Drug use: No  . Sexual activity: Never  Lifestyle  . Physical activity    Days per week: 0 days    Minutes per  session: 0 min  . Stress: Only a little  Relationships  . Social connections    Talks on phone: More than three times a week    Gets together: Once a week    Attends religious service: More than 4 times per year    Active member of club or organization: No    Attends meetings of clubs or organizations: Never    Relationship status: Never married  . Intimate partner violence    Fear of current or ex partner: No    Emotionally abused: No    Physically abused: No    Forced sexual activity: No  Other Topics Concern  . Not on file  Social History Narrative  . Not on file    FAMILY HISTORY:  Family History  Problem Relation Age of Onset  .  Breast cancer Mother   . COPD Mother   . Arthritis Mother   . Diabetes Mother   . Hypertension Mother   . Hypertension Father   . Diabetes Father   . Breast cancer Sister   . Thyroid cancer Brother   . Huntington's disease Maternal Grandmother   . Heart attack Brother     CURRENT MEDICATIONS:  Outpatient Encounter Medications as of 02/04/2019  Medication Sig Note  . amLODipine (NORVASC) 5 MG tablet Take 5 mg by mouth every morning.    . Calcium Carbonate Antacid (TUMS PO) Take 4 tablets by mouth daily.    Marland Kitchen CARBOPLATIN IV Inject into the vein once a week.   . Cyanocobalamin (VITAMIN B-12) 2500 MCG SUBL Place 2,500 mcg under the tongue every morning.    . ferrous sulfate 325 (65 FE) MG tablet Take 1 tablet (325 mg total) by mouth 3 (three) times daily after meals. (Patient taking differently: Take 325 mg by mouth daily. )   . lidocaine-prilocaine (EMLA) cream Apply a small amount to port a cath site and cover with plastic wrap 1 hour prior to chemotherapy appointments   . Multiple Vitamin (MULITIVITAMIN WITH MINERALS) TABS Take 1 tablet by mouth every morning.    Marland Kitchen omeprazole (PRILOSEC) 20 MG capsule Take 20 mg by mouth daily.   Marland Kitchen PACLitaxel (TAXOL IV) Inject into the vein once a week.   . prochlorperazine (COMPAZINE) 10 MG tablet Take 1 tablet (10 mg total) by mouth every 6 (six) hours as needed (Nausea or vomiting).   . ALPRAZolam (XANAX) 0.25 MG tablet Take 0.25 mg by mouth as needed.  02/04/2019: Pt hasn't started yet  . gabapentin (NEURONTIN) 300 MG capsule Take 300 mg by mouth at bedtime as needed for pain.   Marland Kitchen ibuprofen (ADVIL,MOTRIN) 800 MG tablet Take 1 tablet (800 mg total) by mouth 3 (three) times daily. (Patient not taking: Reported on 02/04/2019)   . naproxen (NAPROSYN) 500 MG tablet Take 500 mg by mouth 2 (two) times daily. 02/04/2019: Pt hasn't started yet   No facility-administered encounter medications on file as of 02/04/2019.     ALLERGIES:  No Known Allergies    PHYSICAL EXAM:  ECOG Performance status: 0  Vitals:   02/04/19 0916  BP: (!) 144/71  Pulse: 67  Resp: 20  Temp: (!) 97.3 F (36.3 C)  SpO2: 100%   Filed Weights   02/04/19 0916  Weight: 277 lb 3.2 oz (125.7 kg)    Physical Exam Vitals signs reviewed.  Constitutional:      Appearance: Normal appearance.  Cardiovascular:     Rate and Rhythm: Normal rate and regular rhythm.  Heart sounds: Normal heart sounds.  Pulmonary:     Effort: Pulmonary effort is normal.     Breath sounds: Normal breath sounds.  Abdominal:     General: There is no distension.     Palpations: Abdomen is soft. There is no mass.  Musculoskeletal:        General: Swelling present.  Lymphadenopathy:     Cervical: No cervical adenopathy.  Skin:    General: Skin is warm.  Neurological:     General: No focal deficit present.     Mental Status: She is alert and oriented to person, place, and time.  Psychiatric:        Mood and Affect: Mood normal.        Behavior: Behavior normal.      LABORATORY DATA:  I have reviewed the labs as listed.  CBC    Component Value Date/Time   WBC 2.9 (L) 02/04/2019 0910   RBC 4.15 02/04/2019 0910   HGB 10.5 (L) 02/04/2019 0910   HCT 35.1 (L) 02/04/2019 0910   PLT 236 02/04/2019 0910   MCV 84.6 02/04/2019 0910   MCH 25.3 (L) 02/04/2019 0910   MCHC 29.9 (L) 02/04/2019 0910   RDW 17.7 (H) 02/04/2019 0910   LYMPHSABS 0.4 (L) 02/04/2019 0910   MONOABS 0.3 02/04/2019 0910   EOSABS 0.0 02/04/2019 0910   BASOSABS 0.0 02/04/2019 0910   CMP Latest Ref Rng & Units 02/04/2019 01/28/2019 01/21/2019  Glucose 70 - 99 mg/dL 95 117(H) 101(H)  BUN 8 - 23 mg/dL 13 9 10   Creatinine 0.44 - 1.00 mg/dL 0.65 0.71 0.64  Sodium 135 - 145 mmol/L 141 143 141  Potassium 3.5 - 5.1 mmol/L 4.1 4.0 3.7  Chloride 98 - 111 mmol/L 108 110 111  CO2 22 - 32 mmol/L 25 25 25   Calcium 8.9 - 10.3 mg/dL 8.9 8.5(L) 8.1(L)  Total Protein 6.5 - 8.1 g/dL 6.6 6.6 6.4(L)  Total Bilirubin 0.3 -  1.2 mg/dL 0.3 0.2(L) 0.3  Alkaline Phos 38 - 126 U/L 113 115 109  AST 15 - 41 U/L 16 17 15   ALT 0 - 44 U/L 19 18 18        DIAGNOSTIC IMAGING:  I have independently reviewed the scans and discussed with the patient.    ASSESSMENT & PLAN:   Adenocarcinoma of left lung ([place] [place]) 1.  Clinical stage IIIb (T3N2) adenocarcinoma the left lung: -Presentation with hemoptysis, CT chest on 10/17/2018 showed left upper lobe lung mass, and enlarged AP window lymph node. -CT-guided biopsy on 11/19/2018 consistent with adenocarcinoma.  She smoked 1 pack/day for 15 years, quit 25 years ago. -PET scan on 12/17/2018 shows 7.3 cm left upper lobe lung mass, 1 cm short axis AP window lymph node with SUV 3.4.  Focus of increased metabolic activity in the cecum without definite CT correlate.  She reportedly had colonoscopy 3 to 4 years ago which was normal. -Chemoradiation therapy with carboplatin and paclitaxel weekly started on 01/07/2019. -Week 4 of treatment was on 01/28/2019.  She did report numbness in the fingertips and toes on and off, no worse from last week.  Occasional nausea controlled with Compazine. -We reviewed her labs.  She will proceed with her week 5 of treatment today. -We will see her back in next week for follow-up.   2.  Meningioma: -MRI of the brain on 12/16/2018 showed 1.8 x 2.6 x 2.6 cm enhancing extra-axial mass along the left greater wing of sphenoid.  Mass has imaging appearance of  meningioma.  However dural based metastatic lesion cannot be excluded. -She initially had left-sided facial pain lasting about 3 to 4 weeks.  It subsided.  We will plan to repeat MRI of the brain in 2 months.  3.  Nausea/vomiting: -This has gotten better after we added Emend.  She is taking Compazine as needed.   Orders placed this encounter:  Orders Placed This Encounter  Procedures  . CBC with Differential/Platelet  . Comprehensive metabolic panel      Derek Jack, MD [place] [phone]

## 2019-02-04 NOTE — Assessment & Plan Note (Signed)
1.  Clinical stage IIIb (T3N2) adenocarcinoma the left lung: -Presentation with hemoptysis, CT chest on 10/17/2018 showed left upper lobe lung mass, and enlarged AP window lymph node. -CT-guided biopsy on 11/19/2018 consistent with adenocarcinoma.  She smoked 1 pack/day for 15 years, quit 25 years ago. -PET scan on 12/17/2018 shows 7.3 cm left upper lobe lung mass, 1 cm short axis AP window lymph node with SUV 3.4.  Focus of increased metabolic activity in the cecum without definite CT correlate.  She reportedly had colonoscopy 3 to 4 years ago which was normal. -Chemoradiation therapy with carboplatin and paclitaxel weekly started on 01/07/2019. -Week 4 of treatment was on 01/28/2019.  She did report numbness in the fingertips and toes on and off, no worse from last week.  Occasional nausea controlled with Compazine. -We reviewed her labs.  She will proceed with her week 5 of treatment today. -We will see her back in next week for follow-up.   2.  Meningioma: -MRI of the brain on 12/16/2018 showed 1.8 x 2.6 x 2.6 cm enhancing extra-axial mass along the left greater wing of sphenoid.  Mass has imaging appearance of meningioma.  However dural based metastatic lesion cannot be excluded. -She initially had left-sided facial pain lasting about 3 to 4 weeks.  It subsided.  We will plan to repeat MRI of the brain in 2 months.  3.  Nausea/vomiting: -This has gotten better after we added Emend.  She is taking Compazine as needed.

## 2019-02-04 NOTE — Patient Instructions (Signed)
[facility] Discharge Instructions for Patients Receiving Chemotherapy  Today you received the following chemotherapy agents   To help prevent nausea and vomiting after your treatment, we encourage you to take your nausea medication   If you develop nausea and vomiting that is not controlled by your nausea medication, call the clinic.   BELOW ARE SYMPTOMS THAT SHOULD BE REPORTED IMMEDIATELY:  *FEVER GREATER THAN 100.5 F  *CHILLS WITH OR WITHOUT FEVER  NAUSEA AND VOMITING THAT IS NOT CONTROLLED WITH YOUR NAUSEA MEDICATION  *UNUSUAL SHORTNESS OF BREATH  *UNUSUAL BRUISING OR BLEEDING  TENDERNESS IN MOUTH AND THROAT WITH OR WITHOUT PRESENCE OF ULCERS  *URINARY PROBLEMS  *BOWEL PROBLEMS  UNUSUAL RASH Items with * indicate a potential emergency and should be followed up as soon as possible.  Feel free to call the clinic should you have any questions or concerns. The clinic phone number is [phone].  Please show the CHEMO ALERT CARD at check-in to the Emergency Department and triage nurse.   

## 2019-02-04 NOTE — Progress Notes (Signed)
Pt presents today for treatment and follow up visit with Dr. Delton Coombes. Labs reviewed. MAR reviewed. Patient has no complaints of any changes since the last visit. Pt has no complaints of pain today. Message received to proceed with treatment from [facility].   Treatment given today per MD orders. Tolerated infusion without adverse affects. Vital signs stable. No complaints at this time. Discharged from clinic ambulatory. F/U with [facility] as scheduled.

## 2019-02-04 NOTE — Patient Instructions (Addendum)
[facility] at [facility] Discharge Instructions  You were seen today by Dr. Katragadda. He went over your recent lab results. He will see you back in 1 week for labs, treatment and follow up.   Thank you for choosing [facility] at [facility] to provide your oncology and hematology care.  To afford each patient quality time with our provider, please arrive at least 15 minutes before your scheduled appointment time.   If you have a lab appointment with the Cancer Center please come in thru the  Main Entrance and check in at the main information desk  You need to re-schedule your appointment should you arrive 10 or more minutes late.  We strive to give you quality time with our providers, and arriving late affects you and other patients whose appointments are after yours.  Also, if you no show three or more times for appointments you may be dismissed from the clinic at the providers discretion.     Again, thank you for choosing [facility].  Our hope is that these requests will decrease the amount of time that you wait before being seen by our physicians.       _____________________________________________________________  Should you have questions after your visit to [facility], please contact our office at [phone] between the hours of 8:00 a.m. and 4:30 p.m.  Voicemails left after 4:00 p.m. will not be returned until the following business day.  For prescription refill requests, have your pharmacy contact our office and allow 72 hours.    Cancer Center Support Programs:   > Cancer Support Group  2nd Tuesday of the month 1pm-2pm, Journey Room    

## 2019-02-05 ENCOUNTER — Ambulatory Visit
Admission: RE | Admit: 2019-02-05 | Discharge: 2019-02-05 | Disposition: A | Discharge status: Home / Self Care | Payer: Medicare Other | Source: Ambulatory Visit | Attending: Radiation Oncology | Admitting: Radiation Oncology

## 2019-02-05 ENCOUNTER — Other Ambulatory Visit: Payer: Self-pay

## 2019-02-05 DIAGNOSIS — Z510 Encounter for antineoplastic radiation therapy: Secondary | ICD-10-CM | POA: Diagnosis not present

## 2019-02-05 DIAGNOSIS — C3412 Malignant neoplasm of upper lobe, left bronchus or lung: Secondary | ICD-10-CM | POA: Diagnosis not present

## 2019-02-05 DIAGNOSIS — C3492 Malignant neoplasm of unspecified part of left bronchus or lung: Secondary | ICD-10-CM | POA: Diagnosis not present

## 2019-02-06 ENCOUNTER — Ambulatory Visit
Admission: RE | Admit: 2019-02-06 | Discharge: 2019-02-06 | Disposition: A | Discharge status: Home / Self Care | Payer: Medicare Other | Source: Ambulatory Visit | Attending: Radiation Oncology | Admitting: Radiation Oncology

## 2019-02-06 ENCOUNTER — Other Ambulatory Visit: Payer: Self-pay

## 2019-02-06 DIAGNOSIS — C3492 Malignant neoplasm of unspecified part of left bronchus or lung: Secondary | ICD-10-CM | POA: Diagnosis not present

## 2019-02-06 DIAGNOSIS — Z510 Encounter for antineoplastic radiation therapy: Secondary | ICD-10-CM | POA: Diagnosis not present

## 2019-02-06 DIAGNOSIS — C3412 Malignant neoplasm of upper lobe, left bronchus or lung: Secondary | ICD-10-CM | POA: Diagnosis not present

## 2019-02-07 ENCOUNTER — Ambulatory Visit
Admission: RE | Admit: 2019-02-07 | Discharge: 2019-02-07 | Disposition: A | Discharge status: Home / Self Care | Payer: Medicare Other | Source: Ambulatory Visit | Attending: Radiation Oncology | Admitting: Radiation Oncology

## 2019-02-07 ENCOUNTER — Other Ambulatory Visit: Payer: Self-pay

## 2019-02-07 DIAGNOSIS — C3412 Malignant neoplasm of upper lobe, left bronchus or lung: Secondary | ICD-10-CM | POA: Diagnosis not present

## 2019-02-07 DIAGNOSIS — C3492 Malignant neoplasm of unspecified part of left bronchus or lung: Secondary | ICD-10-CM | POA: Diagnosis not present

## 2019-02-07 DIAGNOSIS — Z510 Encounter for antineoplastic radiation therapy: Secondary | ICD-10-CM | POA: Diagnosis not present

## 2019-02-09 ENCOUNTER — Ambulatory Visit
Admission: RE | Admit: 2019-02-09 | Discharge: 2019-02-09 | Disposition: A | Discharge status: Home / Self Care | Payer: Medicare Other | Source: Ambulatory Visit | Attending: Radiation Oncology | Admitting: Radiation Oncology

## 2019-02-09 ENCOUNTER — Other Ambulatory Visit: Payer: Self-pay

## 2019-02-09 DIAGNOSIS — Z510 Encounter for antineoplastic radiation therapy: Secondary | ICD-10-CM | POA: Diagnosis not present

## 2019-02-09 DIAGNOSIS — C3412 Malignant neoplasm of upper lobe, left bronchus or lung: Secondary | ICD-10-CM | POA: Diagnosis not present

## 2019-02-09 DIAGNOSIS — C3492 Malignant neoplasm of unspecified part of left bronchus or lung: Secondary | ICD-10-CM | POA: Diagnosis not present

## 2019-02-10 ENCOUNTER — Ambulatory Visit
Admission: RE | Admit: 2019-02-10 | Discharge: 2019-02-10 | Disposition: A | Discharge status: Home / Self Care | Payer: Medicare Other | Source: Ambulatory Visit | Attending: Radiation Oncology | Admitting: Radiation Oncology

## 2019-02-10 ENCOUNTER — Ambulatory Visit: Payer: Medicare Other

## 2019-02-10 ENCOUNTER — Other Ambulatory Visit: Payer: Self-pay

## 2019-02-10 DIAGNOSIS — C3412 Malignant neoplasm of upper lobe, left bronchus or lung: Secondary | ICD-10-CM | POA: Diagnosis not present

## 2019-02-10 DIAGNOSIS — Z510 Encounter for antineoplastic radiation therapy: Secondary | ICD-10-CM | POA: Diagnosis not present

## 2019-02-10 DIAGNOSIS — C3492 Malignant neoplasm of unspecified part of left bronchus or lung: Secondary | ICD-10-CM | POA: Diagnosis not present

## 2019-02-11 ENCOUNTER — Encounter (HOSPITAL_COMMUNITY): Payer: Self-pay | Admitting: Hematology

## 2019-02-11 ENCOUNTER — Inpatient Hospital Stay (HOSPITAL_BASED_OUTPATIENT_CLINIC_OR_DEPARTMENT_OTHER): Payer: Medicare Other | Admitting: Hematology

## 2019-02-11 ENCOUNTER — Other Ambulatory Visit: Payer: Self-pay

## 2019-02-11 ENCOUNTER — Inpatient Hospital Stay (HOSPITAL_COMMUNITY): Payer: Medicare Other

## 2019-02-11 ENCOUNTER — Encounter: Payer: Self-pay | Admitting: Radiation Oncology

## 2019-02-11 ENCOUNTER — Ambulatory Visit
Admission: RE | Admit: 2019-02-11 | Discharge: 2019-02-11 | Disposition: A | Discharge status: Home / Self Care | Payer: Medicare Other | Source: Ambulatory Visit | Attending: Radiation Oncology | Admitting: Radiation Oncology

## 2019-02-11 ENCOUNTER — Ambulatory Visit: Payer: Medicare Other

## 2019-02-11 VITALS — BP 135/67 | HR 89 | Temp 97.1°F | Resp 20 | Wt 277.4 lb

## 2019-02-11 DIAGNOSIS — C3492 Malignant neoplasm of unspecified part of left bronchus or lung: Secondary | ICD-10-CM | POA: Diagnosis not present

## 2019-02-11 DIAGNOSIS — R110 Nausea: Secondary | ICD-10-CM | POA: Diagnosis not present

## 2019-02-11 DIAGNOSIS — E119 Type 2 diabetes mellitus without complications: Secondary | ICD-10-CM | POA: Diagnosis not present

## 2019-02-11 DIAGNOSIS — Z5111 Encounter for antineoplastic chemotherapy: Secondary | ICD-10-CM | POA: Diagnosis not present

## 2019-02-11 DIAGNOSIS — Z95828 Presence of other vascular implants and grafts: Secondary | ICD-10-CM

## 2019-02-11 DIAGNOSIS — Z510 Encounter for antineoplastic radiation therapy: Secondary | ICD-10-CM | POA: Diagnosis not present

## 2019-02-11 DIAGNOSIS — D329 Benign neoplasm of meninges, unspecified: Secondary | ICD-10-CM | POA: Diagnosis not present

## 2019-02-11 DIAGNOSIS — C3412 Malignant neoplasm of upper lobe, left bronchus or lung: Secondary | ICD-10-CM | POA: Diagnosis not present

## 2019-02-11 LAB — CBC WITH DIFFERENTIAL/PLATELET
Abs Immature Granulocytes: 0.01 10*3/uL (ref 0.00–0.07)
Basophils Absolute: 0 10*3/uL (ref 0.0–0.1)
Basophils Relative: 1 %
Eosinophils Absolute: 0.1 10*3/uL (ref 0.0–0.5)
Eosinophils Relative: 2 %
HCT: 36.1 % (ref 36.0–46.0)
Hemoglobin: 10.9 g/dL — ABNORMAL LOW (ref 12.0–15.0)
Immature Granulocytes: 0 %
Lymphocytes Relative: 16 %
Lymphs Abs: 0.4 10*3/uL — ABNORMAL LOW (ref 0.7–4.0)
MCH: 25.5 pg — ABNORMAL LOW (ref 26.0–34.0)
MCHC: 30.2 g/dL (ref 30.0–36.0)
MCV: 84.5 fL (ref 80.0–100.0)
Monocytes Absolute: 0.2 10*3/uL (ref 0.1–1.0)
Monocytes Relative: 9 %
Neutro Abs: 2.1 10*3/uL (ref 1.7–7.7)
Neutrophils Relative %: 72 %
Platelets: 192 10*3/uL (ref 150–400)
RBC: 4.27 MIL/uL (ref 3.87–5.11)
RDW: 18.1 % — ABNORMAL HIGH (ref 11.5–15.5)
WBC: 2.8 10*3/uL — ABNORMAL LOW (ref 4.0–10.5)
nRBC: 0 % (ref 0.0–0.2)

## 2019-02-11 LAB — COMPREHENSIVE METABOLIC PANEL
ALT: 19 U/L (ref 0–44)
AST: 18 U/L (ref 15–41)
Albumin: 3.5 g/dL (ref 3.5–5.0)
Alkaline Phosphatase: 109 U/L (ref 38–126)
Anion gap: 8 (ref 5–15)
BUN: 14 mg/dL (ref 8–23)
CO2: 25 mmol/L (ref 22–32)
Calcium: 8.8 mg/dL — ABNORMAL LOW (ref 8.9–10.3)
Chloride: 108 mmol/L (ref 98–111)
Creatinine, Ser: 0.72 mg/dL (ref 0.44–1.00)
GFR calc Af Amer: >60 mL/min (ref >60)
GFR calc non Af Amer: >60 mL/min (ref >60)
Glucose, Bld: 143 mg/dL — ABNORMAL HIGH (ref 70–99)
Potassium: 3.9 mmol/L (ref 3.5–5.1)
Sodium: 141 mmol/L (ref 135–145)
Total Bilirubin: 0.6 mg/dL (ref 0.3–1.2)
Total Protein: 6.8 g/dL (ref 6.5–8.1)

## 2019-02-11 MED ORDER — SODIUM CHLORIDE 0.9 % IV SOLN
45.0000 mg/m2 | Freq: Once | INTRAVENOUS | Status: AC
  Administered 2019-02-11: 108 mg via INTRAVENOUS
  Filled 2019-02-11: qty 18

## 2019-02-11 MED ORDER — SODIUM CHLORIDE 0.9 % IV SOLN
266.4000 mg | Freq: Once | INTRAVENOUS | Status: AC
  Administered 2019-02-11: 270 mg via INTRAVENOUS
  Filled 2019-02-11: qty 27

## 2019-02-11 MED ORDER — DIPHENHYDRAMINE HCL 50 MG/ML IJ SOLN
50.0000 mg | Freq: Once | INTRAMUSCULAR | Status: AC
  Administered 2019-02-11: 50 mg via INTRAVENOUS
  Filled 2019-02-11: qty 1

## 2019-02-11 MED ORDER — SODIUM CHLORIDE 0.9 % IV SOLN
Freq: Once | INTRAVENOUS | Status: AC
  Administered 2019-02-11: 12:00:00 via INTRAVENOUS
  Filled 2019-02-11: qty 5

## 2019-02-11 MED ORDER — SODIUM CHLORIDE 0.9% FLUSH
10.0000 mL | INTRAVENOUS | Status: DC | PRN
  Administered 2019-02-11: 10 mL
  Filled 2019-02-11: qty 10

## 2019-02-11 MED ORDER — FAMOTIDINE IN NACL 20-0.9 MG/50ML-% IV SOLN
20.0000 mg | Freq: Once | INTRAVENOUS | Status: AC
  Administered 2019-02-11: 20 mg via INTRAVENOUS
  Filled 2019-02-11: qty 50

## 2019-02-11 MED ORDER — PALONOSETRON HCL INJECTION 0.25 MG/5ML
0.2500 mg | Freq: Once | INTRAVENOUS | Status: AC
  Administered 2019-02-11: 0.25 mg via INTRAVENOUS
  Filled 2019-02-11: qty 5

## 2019-02-11 MED ORDER — HEPARIN SOD (PORK) LOCK FLUSH 100 UNIT/ML IV SOLN
500.0000 [IU] | Freq: Once | INTRAVENOUS | Status: AC | PRN
  Administered 2019-02-11: 500 [IU]

## 2019-02-11 MED ORDER — SODIUM CHLORIDE 0.9 % IV SOLN
Freq: Once | INTRAVENOUS | Status: AC
  Administered 2019-02-11: 11:00:00 via INTRAVENOUS

## 2019-02-11 NOTE — Patient Instructions (Addendum)
[place] at [facility] Discharge Instructions  You were seen today by Dr. Delton Coombes. He went over your recent lab results. He will see you back in 3 weeks for labs, scan and follow up.   Thank you for choosing [place] at [facility] to provide your oncology and hematology care.  To afford each patient quality time with our provider, please arrive at least 15 minutes before your scheduled appointment time.   If you have a lab appointment with the [place] please come in thru the  Main Entrance and check in at the main information desk  You need to re-schedule your appointment should you arrive 10 or more minutes late.  We strive to give you quality time with our providers, and arriving late affects you and other patients whose appointments are after yours.  Also, if you no show three or more times for appointments you may be dismissed from the clinic at the providers discretion.     Again, thank you for choosing [facility].  Our hope is that these requests will decrease the amount of time that you wait before being seen by our physicians.       _____________________________________________________________  Should you have questions after your visit to [facility], please contact our office at [phone] between the hours of 8:00 a.m. and 4:30 p.m.  Voicemails left after 4:00 p.m. will not be returned until the following business day.  For prescription refill requests, have your pharmacy contact our office and allow 72 hours.    Cancer Center Support Programs:   > Cancer Support Group  2nd Tuesday of the month 1pm-2pm, Journey Room

## 2019-02-11 NOTE — Progress Notes (Signed)
[address] [place], [place] [address]   CLINIC:  Medical Oncology/Hematology  PCP:  Iona Beard, MD [address] [place] [phone]   REASON FOR VISIT:  Stage III left lung adenocarcinoma.  CURRENT THERAPY: Weekly carbotaxol with XRT.    INTERVAL HISTORY:  Denise [name] 68 y.o. female seen for follow-up of stage III lung cancer nontoxic reassessment.  She has been receiving chemoradiation therapy with weekly carboplatin and paclitaxel.  She reported appetite of 100%.  Energy levels are 25%.  She reports generalized weakness.  Shortness of breath on exertion present.  She gives out easily.  She had some blood in the mucus when she blows her nose in the mornings.  No spontaneous nosebleeds reported.  She had nausea which was controlled with Compazine.  She did not have vomiting.  Denies any tingling or numbness in extremities.  REVIEW OF SYSTEMS:  Review of Systems  Constitutional: Positive for fatigue.  Respiratory: Positive for shortness of breath.   Gastrointestinal: Positive for nausea.  All other systems reviewed and are negative.    PAST MEDICAL/SURGICAL HISTORY:  Past Medical History:  Diagnosis Date  . Anemia   . Arthritis   . Diabetes mellitus    diet controlled  . GERD (gastroesophageal reflux disease)   . HTN (hypertension)   . HTN (hypertension)   . Port-A-Cath in place 01/06/2019  . Shortness of breath dyspnea    with exertion   Past Surgical History:  Procedure Laterality Date  . CHOLECYSTECTOMY  1997  . COLONOSCOPY    . GASTRIC BYPASS    . INCISIONAL HERNIA REPAIR  04/11/11  . IR IMAGING GUIDED PORT INSERTION  12/27/2018  . LAPAROSCOPIC SALPINGOOPHERECTOMY    . LAPAROTOMY  04/11/2011   Procedure: EXPLORATORY LAPAROTOMY;  Surgeon: Joyice Faster. Cornett, MD;  Location: [facility];  Service: General;  Laterality: N/A;  closure port hole  . TOTAL HIP ARTHROPLASTY  03/07/2012   Procedure: TOTAL HIP ARTHROPLASTY  ANTERIOR APPROACH;  Surgeon: Mauri Pole, MD;  Location: [facility];  Service: Orthopedics;  Laterality: Right;  . TOTAL SHOULDER ARTHROPLASTY Left 01/21/2015  . TOTAL SHOULDER ARTHROPLASTY Left 01/21/2015   Procedure: LEFT TOTAL SHOULDER ARTHROPLASTY;  Surgeon: Justice Britain, MD;  Location: [facility];  Service: Orthopedics;  Laterality: Left;  Marland Kitchen VAGINAL HYSTERECTOMY       SOCIAL HISTORY:  Social History   Socioeconomic History  . Marital status: Single    Spouse name: Not on file  . Number of children: Not on file  . Years of education: 12th grade  . Highest education level: Not on file  Occupational History  . Occupation: Employed    Employer: [employer]  . Financial resource strain: Not very hard  . Food insecurity    Worry: Never true    Inability: Never true  . Transportation needs    Medical: No    Non-medical: No  Tobacco Use  . Smoking status: Former Smoker    Packs/day: 1.00    Years: 12.00    Pack years: 12.00    Types: Cigarettes    Quit date: 03/20/1976    Years since quitting: 42.9  . Smokeless tobacco: Never Used  Substance and Sexual Activity  . Alcohol use: No  . Drug use: No  . Sexual activity: Never  Lifestyle  . Physical activity    Days per week: 0 days    Minutes per session: 0  min  . Stress: Only a little  Relationships  . Social connections    Talks on phone: More than three times a week    Gets together: Once a week    Attends religious service: More than 4 times per year    Active member of club or organization: No    Attends meetings of clubs or organizations: Never    Relationship status: Never married  . Intimate partner violence    Fear of current or ex partner: No    Emotionally abused: No    Physically abused: No    Forced sexual activity: No  Other Topics Concern  . Not on file  Social History Narrative  . Not on file    FAMILY HISTORY:  Family History  Problem Relation Age of Onset  . Breast cancer  Mother   . COPD Mother   . Arthritis Mother   . Diabetes Mother   . Hypertension Mother   . Hypertension Father   . Diabetes Father   . Breast cancer Sister   . Thyroid cancer Brother   . Huntington's disease Maternal Grandmother   . Heart attack Brother     CURRENT MEDICATIONS:  Outpatient Encounter Medications as of 02/11/2019  Medication Sig Note  . amLODipine (NORVASC) 5 MG tablet Take 5 mg by mouth every morning.    . Calcium Carbonate Antacid (TUMS PO) Take 4 tablets by mouth daily.    Marland Kitchen CARBOPLATIN IV Inject into the vein once a week.   . Cyanocobalamin (VITAMIN B-12) 2500 MCG SUBL Place 2,500 mcg under the tongue every morning.    . ferrous sulfate 325 (65 FE) MG tablet Take 1 tablet (325 mg total) by mouth 3 (three) times daily after meals. (Patient taking differently: Take 325 mg by mouth daily. )   . ibuprofen (ADVIL,MOTRIN) 800 MG tablet Take 1 tablet (800 mg total) by mouth 3 (three) times daily. (Patient taking differently: Take 800 mg by mouth as needed. )   . lidocaine-prilocaine (EMLA) cream Apply a small amount to port a cath site and cover with plastic wrap 1 hour prior to chemotherapy appointments   . Multiple Vitamin (MULITIVITAMIN WITH MINERALS) TABS Take 1 tablet by mouth every morning.    Marland Kitchen omeprazole (PRILOSEC) 20 MG capsule Take 20 mg by mouth daily.   Marland Kitchen PACLitaxel (TAXOL IV) Inject into the vein once a week.   . prochlorperazine (COMPAZINE) 10 MG tablet Take 1 tablet (10 mg total) by mouth every 6 (six) hours as needed (Nausea or vomiting).   . ALPRAZolam (XANAX) 0.25 MG tablet Take 0.25 mg by mouth as needed.  02/04/2019: Pt hasn't started yet  . gabapentin (NEURONTIN) 300 MG capsule Take 300 mg by mouth at bedtime as needed for pain.   . naproxen (NAPROSYN) 500 MG tablet Take 500 mg by mouth 2 (two) times daily. 02/04/2019: Pt hasn't started yet   No facility-administered encounter medications on file as of 02/11/2019.     ALLERGIES:  No Known Allergies    PHYSICAL EXAM:  ECOG Performance status: 0  There were no vitals filed for this visit. There were no vitals filed for this visit.  Physical Exam Vitals signs reviewed.  Constitutional:      Appearance: Normal appearance.  Cardiovascular:     Rate and Rhythm: Normal rate and regular rhythm.     Heart sounds: Normal heart sounds.  Pulmonary:     Effort: Pulmonary effort is normal.     Breath sounds:  Normal breath sounds.  Abdominal:     General: There is no distension.     Palpations: Abdomen is soft. There is no mass.  Musculoskeletal:        General: Swelling present.  Lymphadenopathy:     Cervical: No cervical adenopathy.  Skin:    General: Skin is warm.  Neurological:     General: No focal deficit present.     Mental Status: She is alert and oriented to person, place, and time.  Psychiatric:        Mood and Affect: Mood normal.        Behavior: Behavior normal.      LABORATORY DATA:  I have reviewed the labs as listed.  CBC    Component Value Date/Time   WBC 2.8 (L) 02/11/2019 0953   RBC 4.27 02/11/2019 0953   HGB 10.9 (L) 02/11/2019 0953   HCT 36.1 02/11/2019 0953   PLT 192 02/11/2019 0953   MCV 84.5 02/11/2019 0953   MCH 25.5 (L) 02/11/2019 0953   MCHC 30.2 02/11/2019 0953   RDW 18.1 (H) 02/11/2019 0953   LYMPHSABS 0.4 (L) 02/11/2019 0953   MONOABS 0.2 02/11/2019 0953   EOSABS 0.1 02/11/2019 0953   BASOSABS 0.0 02/11/2019 0953   CMP Latest Ref Rng & Units 02/11/2019 02/04/2019 01/28/2019  Glucose 70 - 99 mg/dL 143(H) 95 117(H)  BUN 8 - 23 mg/dL 14 13 9   Creatinine 0.44 - 1.00 mg/dL 0.72 0.65 0.71  Sodium 135 - 145 mmol/L 141 141 143  Potassium 3.5 - 5.1 mmol/L 3.9 4.1 4.0  Chloride 98 - 111 mmol/L 108 108 110  CO2 22 - 32 mmol/L 25 25 25   Calcium 8.9 - 10.3 mg/dL 8.8(L) 8.9 8.5(L)  Total Protein 6.5 - 8.1 g/dL 6.8 6.6 6.6  Total Bilirubin 0.3 - 1.2 mg/dL 0.6 0.3 0.2(L)  Alkaline Phos 38 - 126 U/L 109 113 115  AST 15 - 41 U/L 18 16 17   ALT 0  - 44 U/L 19 19 18        DIAGNOSTIC IMAGING:  I have independently reviewed the scans and discussed with the patient.    ASSESSMENT & PLAN:   Adenocarcinoma of left lung ([place]) 1.  Clinical stage IIIb (T3N2) adenocarcinoma the left lung: -Presentation with hemoptysis, CT chest on 10/17/2018 showed left upper lobe lung mass, and enlarged AP window lymph node. -CT-guided biopsy on 11/19/2018 consistent with adenocarcinoma.  She smoked 1 pack/day for 15 years, quit 25 years ago. -PET scan on 12/17/2018 shows 7.3 cm left upper lobe lung mass, 1 cm short axis AP window lymph node with SUV 3.4.  Focus of increased metabolic activity in the cecum without definite CT correlate.  She reportedly had colonoscopy 3 to 4 years ago which was normal. -Chemoradiation therapy with carboplatin and paclitaxel weekly started on 01/07/2019. -Week 5 of treatment was on 02/04/2019. -She did not report any tingling or numbness.  She reported generalized weakness.  No difficulty swallowing. -I have reviewed labs.  White count is 2.8 with ANC of 2200.  She will proceed with her cycle 6 today. -She is completing radiation therapy today.  This will be her last chemotherapy.  I have recommended CT of the chest in 3 weeks. -We also talked about initiation of immunotherapy at 4 to 6 weeks upon completion of chemoradiation therapy, if she gets at least partial response.  Durvalumab can be used every 2 weeks, although it has new indication to use every 4 weeks.  2.  Meningioma: -MRI of the brain on 12/16/2018 showed 1.8 x 2.6 x 2.6 cm enhancing extra-axial mass along the left greater wing of sphenoid.  Mass has imaging appearance of meningioma.  However dural based metastatic lesion cannot be excluded. -She had left-sided facial pain which subsided. -She has another scan scheduled next week.  3.  Nausea/vomiting: -She did report some nausea but denied any vomiting.  We will continue Compazine as needed.   Orders placed  this encounter:  Orders Placed This Encounter  Procedures  . CT Chest W Contrast  . CBC with Differential/Platelet  . Comprehensive metabolic panel      Derek Jack, MD [place] [phone]

## 2019-02-11 NOTE — Assessment & Plan Note (Signed)
1.  Clinical stage IIIb (T3N2) adenocarcinoma the left lung: -Presentation with hemoptysis, CT chest on 10/17/2018 showed left upper lobe lung mass, and enlarged AP window lymph node. -CT-guided biopsy on 11/19/2018 consistent with adenocarcinoma.  She smoked 1 pack/day for 15 years, quit 25 years ago. -PET scan on 12/17/2018 shows 7.3 cm left upper lobe lung mass, 1 cm short axis AP window lymph node with SUV 3.4.  Focus of increased metabolic activity in the cecum without definite CT correlate.  She reportedly had colonoscopy 3 to 4 years ago which was normal. -Chemoradiation therapy with carboplatin and paclitaxel weekly started on 01/07/2019. -Week 5 of treatment was on 02/04/2019. -She did not report any tingling or numbness.  She reported generalized weakness.  No difficulty swallowing. -I have reviewed labs.  White count is 2.8 with ANC of 2200.  She will proceed with her cycle 6 today. -She is completing radiation therapy today.  This will be her last chemotherapy.  I have recommended CT of the chest in 3 weeks. -We also talked about initiation of immunotherapy at 4 to 6 weeks upon completion of chemoradiation therapy, if she gets at least partial response.  Durvalumab can be used every 2 weeks, although it has new indication to use every 4 weeks.   2.  Meningioma: -MRI of the brain on 12/16/2018 showed 1.8 x 2.6 x 2.6 cm enhancing extra-axial mass along the left greater wing of sphenoid.  Mass has imaging appearance of meningioma.  However dural based metastatic lesion cannot be excluded. -She had left-sided facial pain which subsided. -She has another scan scheduled next week.  3.  Nausea/vomiting: -She did report some nausea but denied any vomiting.  We will continue Compazine as needed.

## 2019-02-11 NOTE — Progress Notes (Signed)
Labs reviewed today with MD. Proceed as planned.   Treatment given per orders. Patient tolerated it well without problems. Vitals stable and discharged home from clinic ambulatory. Follow up as scheduled.

## 2019-02-12 ENCOUNTER — Ambulatory Visit: Payer: Medicare Other

## 2019-02-12 NOTE — Progress Notes (Incomplete)
°  Patient Name: Denise [name] MRN: [id number] DOB: 27-Oct-1950 Referring Physician: Iona Beard (Profile Not Attached) Date of Service: 02/11/2019  [facility], [place]                                                        End Of Treatment Note  Diagnoses: C34.12-Malignant neoplasm of upper lobe, left bronchus or lung  Cancer Staging: Clinical stage IIIb (T3N2) adenocarcinoma the left lung  Intent: Curative  Radiation Treatment Dates: 01/02/2019 through 02/11/2019 Site Technique Total Dose (Gy) Dose per Fx (Gy) Completed Fx Beam Energies  Thorax: Lung_Lt 3D 60/60 2 30/30 6X, 10X, 15X   Narrative: The patient tolerated radiation therapy relatively well. Patient reported moderate fatigue throughout, which was associated with chemotherapy (every Tuesday at [facility]). Patient also reported baseline shortness of breath with exertion and an intermittent cough. Towards the end of treatment, she reported some soreness and erythema beneath left breast. No significant exam findings.  Plan: The patient will follow-up with radiation oncology in one month.  ________________________________________________   Blair Promise, PhD, MD  This document serves as a record of services personally performed by Gery Pray, MD. It was created on his behalf by Clerance Lav, a trained medical scribe. The creation of this record is based on the scribe's personal observations and the provider's statements to them. This document has been checked and approved by the attending provider.

## 2019-02-18 ENCOUNTER — Ambulatory Visit
Admission: RE | Admit: 2019-02-18 | Discharge: 2019-02-18 | Disposition: A | Discharge status: Home / Self Care | Payer: Medicare Other | Source: Ambulatory Visit | Attending: Radiation Oncology | Admitting: Radiation Oncology

## 2019-02-18 DIAGNOSIS — D320 Benign neoplasm of cerebral meninges: Secondary | ICD-10-CM | POA: Diagnosis not present

## 2019-02-18 DIAGNOSIS — D329 Benign neoplasm of meninges, unspecified: Secondary | ICD-10-CM

## 2019-02-18 IMAGING — MR MR HEAD WO/W CM
11 series · 48 of 48 positions shown · IV contrast (multihance)
Comparison: [DATE]

CLINICAL DATA: Follow-up left sphenoid wing meningioma.

EXAM:
MRI HEAD WITHOUT AND WITH CONTRAST
TECHNIQUE: Multiplanar, multiecho pulse sequences of the brain and surrounding
structures were obtained without and with intravenous contrast.
CONTRAST:  20mL MULTIHANCE GADOBENATE DIMEGLUMINE 529 MG/ML IV SOLN

[Series 2: FLAIR · sagittal · 3.0mm · 0.75mm/px · 2 of 39 slices shown (1 of 2)]
[im 1/39]
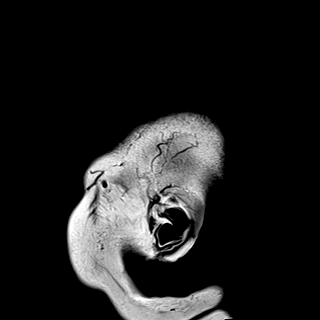
[im 39/39]
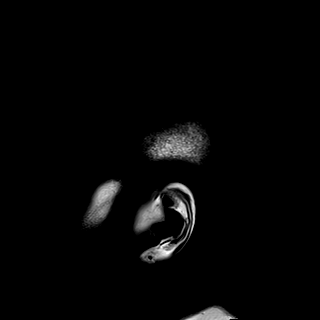

[Series 3: DWI · axial · 3.0mm · 1.50mm/px · z∈[-72,+76]mm · 5 of 78 slices shown (1 of 2)]
[im 1/78]
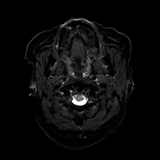
[im 20/78]
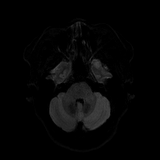
[im 39/78]
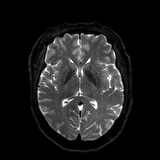
[im 58/78]
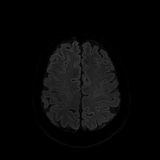
[im 78/78]
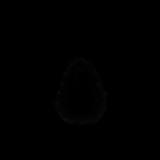

[Series 4: DWI · axial · 3.0mm · 1.50mm/px · z∈[-72,+76]mm · 2 of 39 slices shown (2 of 2)]
[im 1/39]
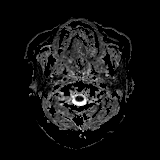
[im 39/39]
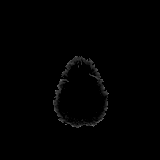

[Series 5: T2 · axial · 5.0mm · 0.57mm/px · z∈[-76,+80]mm · 2 of 27 slices shown]
[im 1/27]
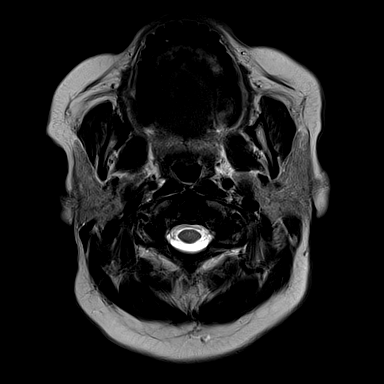
[im 27/27]
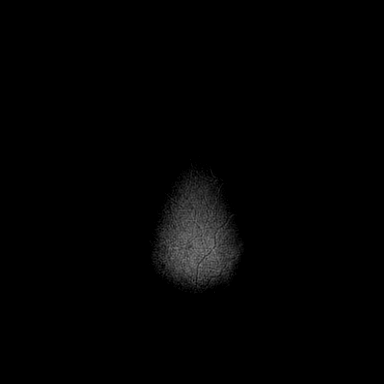

[Series 7: swi_images · axial · 1.5mm · 0.90mm/px · z∈[-69,+73]mm · 6 of 96 slices shown]
[im 1/96]
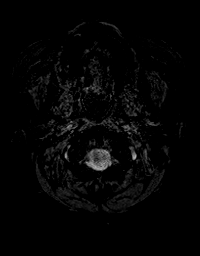
[im 20/96]
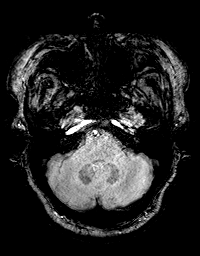
[im 39/96]
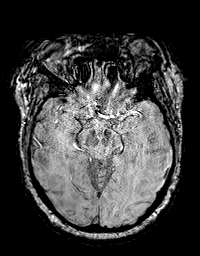
[im 58/96]
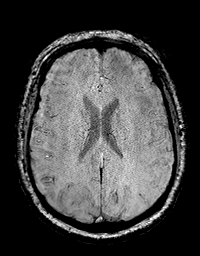
[im 77/96]
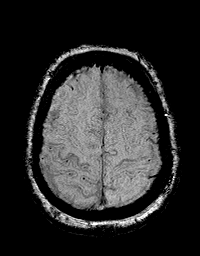
[im 96/96]
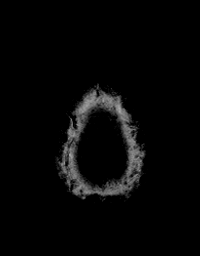

[Series 8: FLAIR · axial · 3.0mm · 0.57mm/px · z∈[-75,+78]mm · 3 of 52 slices shown (2 of 2)]
[im 1/52]
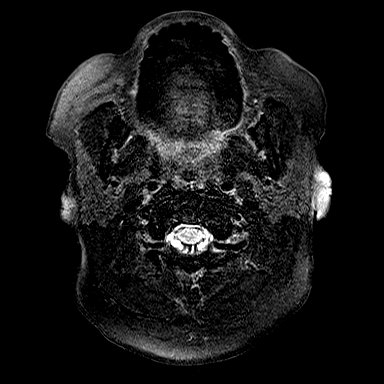
[im 26/52]
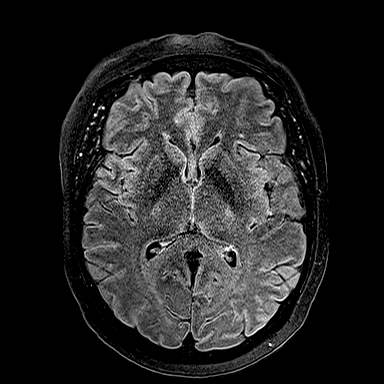
[im 52/52]
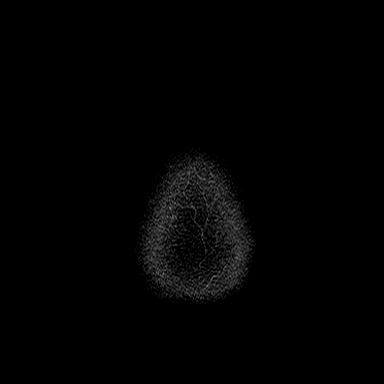

[Series 9: T1 · axial · 1.0mm · 0.75mm/px · z∈[-78,+81]mm · 10 of 160 slices shown]
[im 1/160]
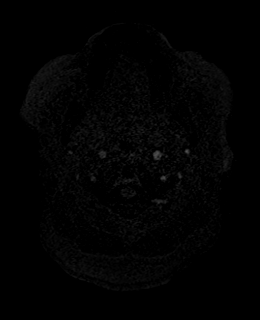
[im 18/160]
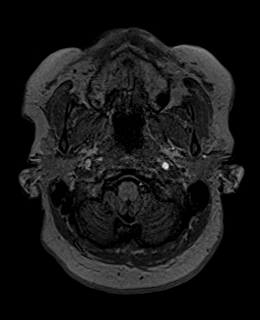
[im 36/160]
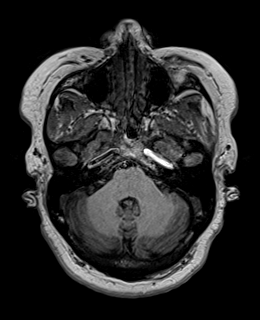
[im 54/160]
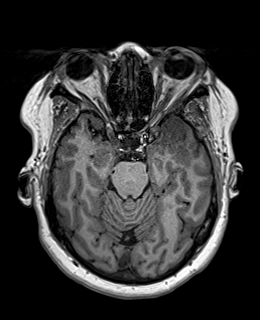
[im 71/160]
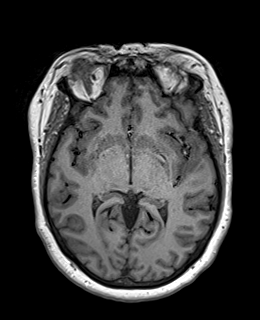
[im 89/160]
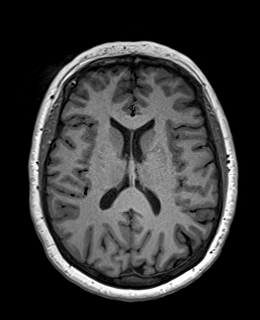
[im 107/160]
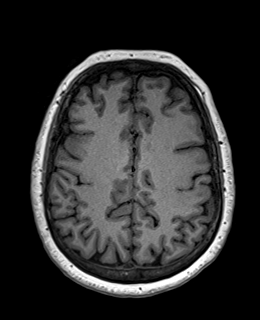
[im 124/160]
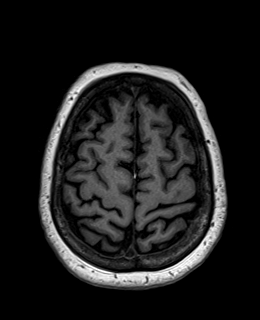
[im 142/160]
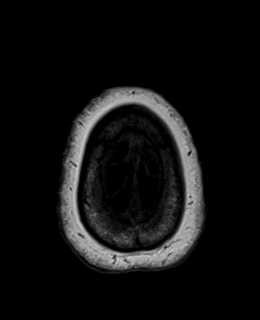
[im 160/160]
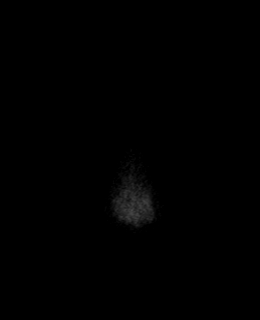

[Series 10: T2 post-contrast · coronal · 3.0mm · 0.57mm/px · 3 of 47 slices shown]
[im 1/47]
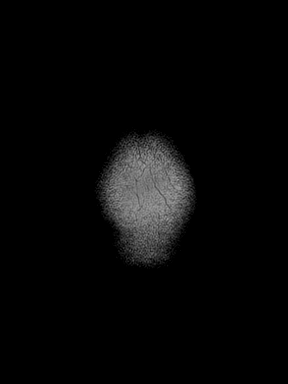
[im 24/47]
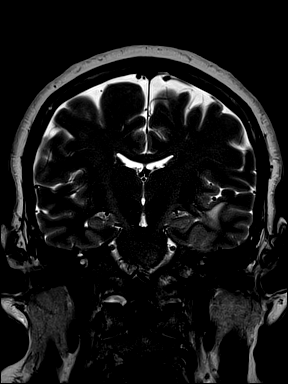
[im 47/47]
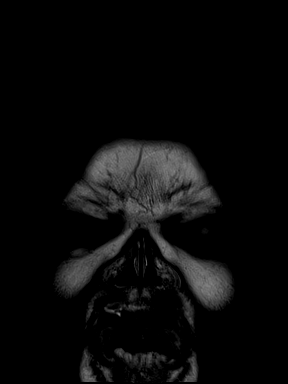

[Series 11: T1 post-contrast · axial · 1.0mm · 0.75mm/px · z∈[-78,+81]mm · 10 of 160 slices shown (1 of 2)]
[im 1/160]
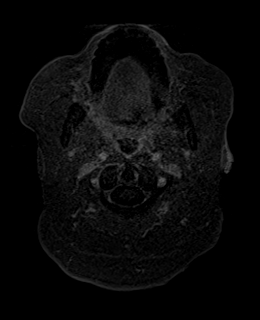
[im 18/160]
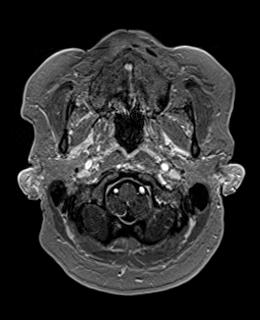
[im 36/160]
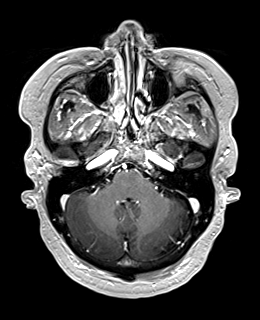
[im 54/160]
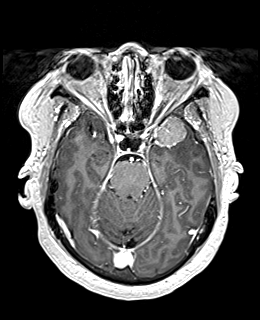
[im 71/160]
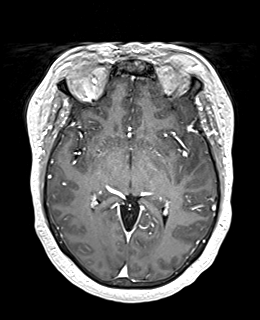
[im 89/160]
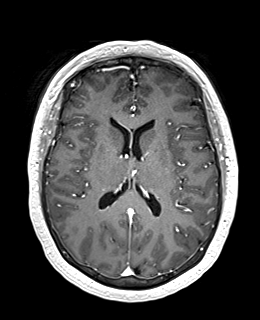
[im 107/160]
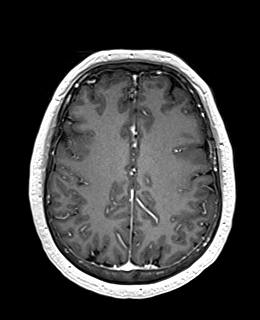
[im 124/160]
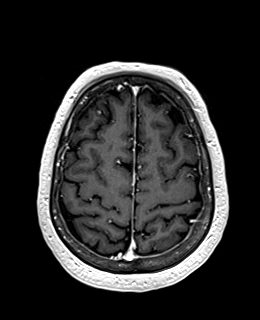
[im 142/160]
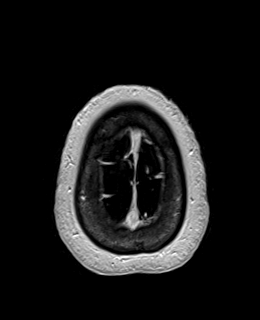
[im 160/160]
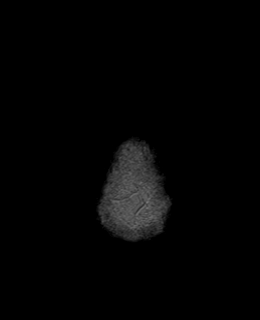

[Series 12: T1 post-contrast · coronal · 3.0mm · 0.57mm/px · 3 of 47 slices shown (2 of 2)]
[im 1/47]
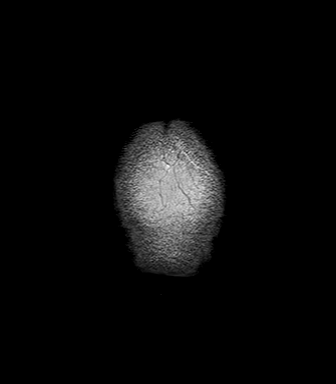
[im 24/47]
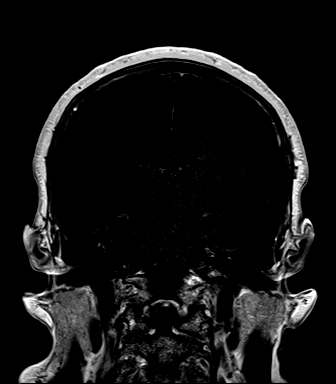
[im 47/47]
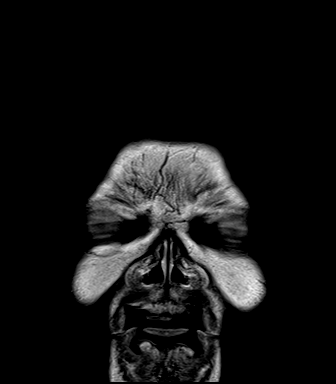

[Series 13: FLAIR post-contrast · sagittal · 3.0mm · 0.75mm/px · 2 of 39 slices shown]
[im 1/39]
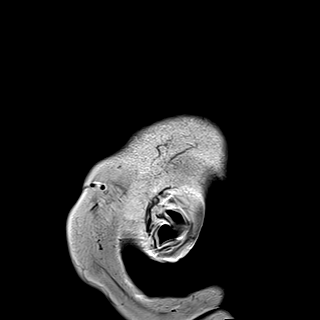
[im 39/39]
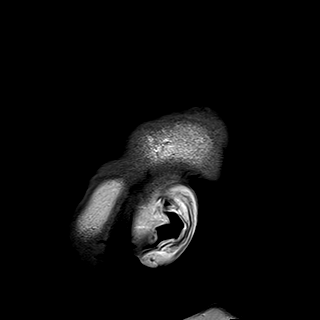

[48 of 48 positions shown; findings below may reference images not displayed]

FINDINGS: Brain: The examination redemonstrates a meningioma the greater wing
of the sphenoid on the left measuring 2.5 x 2.0 x 2.5 cm. Compared
to the same measurements on the study [DATE] there is no
change. Mild mass-effect upon the temporal tip on the left.
Vasogenic edema within the left temporal lobe is slightly more
pronounced. Elsewhere the brain has normal appearance without
evidence of old small or large vessel infarction. No sign metastatic
disease. No hydrocephalus. No extra-axial collection. Chronic
arachnoid herniation into the sella as seen previously.

Vascular: Major vessels at the base of the brain show flow.

Skull and upper cervical spine: Negative

Sinuses/Orbits: Clear/normal

Other: None
IMPRESSION: Similar appearance of a meningioma of the greater wing of the
sphenoid on the left measuring 2.5 x 2.0 x 2.5 cm. Mass-effect upon
the left temporal tip. Vasogenic edema in the left temporal lobe,
probably slightly increased since the previous study. No sign that
this represents a metastatic lesion.

No other brain abnormality.

## 2019-02-18 MED ORDER — GADOBENATE DIMEGLUMINE 529 MG/ML IV SOLN
20.0000 mL | Freq: Once | INTRAVENOUS | Status: AC | PRN
  Administered 2019-02-18: 20 mL via INTRAVENOUS

## 2019-03-03 ENCOUNTER — Inpatient Hospital Stay (HOSPITAL_COMMUNITY): Payer: Medicare Other | Attending: Hematology

## 2019-03-03 ENCOUNTER — Other Ambulatory Visit: Payer: Self-pay

## 2019-03-03 ENCOUNTER — Ambulatory Visit (HOSPITAL_COMMUNITY)
Admission: RE | Admit: 2019-03-03 | Discharge: 2019-03-03 | Disposition: A | Discharge status: Home / Self Care | Payer: Medicare Other | Source: Ambulatory Visit | Attending: Hematology | Admitting: Hematology

## 2019-03-03 DIAGNOSIS — R112 Nausea with vomiting, unspecified: Secondary | ICD-10-CM | POA: Insufficient documentation

## 2019-03-03 DIAGNOSIS — R9389 Abnormal findings on diagnostic imaging of other specified body structures: Secondary | ICD-10-CM | POA: Insufficient documentation

## 2019-03-03 DIAGNOSIS — C3492 Malignant neoplasm of unspecified part of left bronchus or lung: Secondary | ICD-10-CM | POA: Diagnosis not present

## 2019-03-03 DIAGNOSIS — C3412 Malignant neoplasm of upper lobe, left bronchus or lung: Secondary | ICD-10-CM | POA: Insufficient documentation

## 2019-03-03 DIAGNOSIS — Z5112 Encounter for antineoplastic immunotherapy: Secondary | ICD-10-CM | POA: Diagnosis not present

## 2019-03-03 DIAGNOSIS — Z79899 Other long term (current) drug therapy: Secondary | ICD-10-CM | POA: Insufficient documentation

## 2019-03-03 LAB — CBC WITH DIFFERENTIAL/PLATELET
Abs Immature Granulocytes: 0.01 10*3/uL (ref 0.00–0.07)
Basophils Absolute: 0 10*3/uL (ref 0.0–0.1)
Basophils Relative: 1 %
Eosinophils Absolute: 0.1 10*3/uL (ref 0.0–0.5)
Eosinophils Relative: 3 %
HCT: 35.5 % — ABNORMAL LOW (ref 36.0–46.0)
Hemoglobin: 10.6 g/dL — ABNORMAL LOW (ref 12.0–15.0)
Immature Granulocytes: 0 %
Lymphocytes Relative: 20 %
Lymphs Abs: 0.5 10*3/uL — ABNORMAL LOW (ref 0.7–4.0)
MCH: 26.2 pg (ref 26.0–34.0)
MCHC: 29.9 g/dL — ABNORMAL LOW (ref 30.0–36.0)
MCV: 87.7 fL (ref 80.0–100.0)
Monocytes Absolute: 0.4 10*3/uL (ref 0.1–1.0)
Monocytes Relative: 14 %
Neutro Abs: 1.7 10*3/uL (ref 1.7–7.7)
Neutrophils Relative %: 62 %
Platelets: 269 10*3/uL (ref 150–400)
RBC: 4.05 MIL/uL (ref 3.87–5.11)
RDW: 19.7 % — ABNORMAL HIGH (ref 11.5–15.5)
WBC: 2.7 10*3/uL — ABNORMAL LOW (ref 4.0–10.5)
nRBC: 0 % (ref 0.0–0.2)

## 2019-03-03 LAB — COMPREHENSIVE METABOLIC PANEL
ALT: 17 U/L (ref 0–44)
AST: 17 U/L (ref 15–41)
Albumin: 3.4 g/dL — ABNORMAL LOW (ref 3.5–5.0)
Alkaline Phosphatase: 103 U/L (ref 38–126)
Anion gap: 9 (ref 5–15)
BUN: 11 mg/dL (ref 8–23)
CO2: 28 mmol/L (ref 22–32)
Calcium: 9.5 mg/dL (ref 8.9–10.3)
Chloride: 107 mmol/L (ref 98–111)
Creatinine, Ser: 0.69 mg/dL (ref 0.44–1.00)
GFR calc Af Amer: >60 mL/min (ref >60)
GFR calc non Af Amer: >60 mL/min (ref >60)
Glucose, Bld: 96 mg/dL (ref 70–99)
Potassium: 4.4 mmol/L (ref 3.5–5.1)
Sodium: 144 mmol/L (ref 135–145)
Total Bilirubin: 0.5 mg/dL (ref 0.3–1.2)
Total Protein: 6.7 g/dL (ref 6.5–8.1)

## 2019-03-03 IMAGING — CT CT CHEST W/ CM
2 of 3 series · 15 of 36 positions shown, 18 images · IV contrast (Omnipaque or Isovue)
Comparison: PET [DATE] and CT chest [DATE].

CLINICAL DATA: Lung cancer. Chemo radiation started [DATE] and
finished [DATE]. Immunotherapy treatment planned.

EXAM:
CT CHEST WITH CONTRAST
TECHNIQUE: Multidetector CT imaging of the chest was performed during
intravenous contrast administration.
CONTRAST:  75mL OMNIPAQUE IOHEXOL 300 MG/ML  SOLN

[Series 2: routine chest with · axial · 0.73mm/px · z∈[-59,+185]mm · 12 of 151 slices shown, 15 images]
[im 12/151  mediastinal]
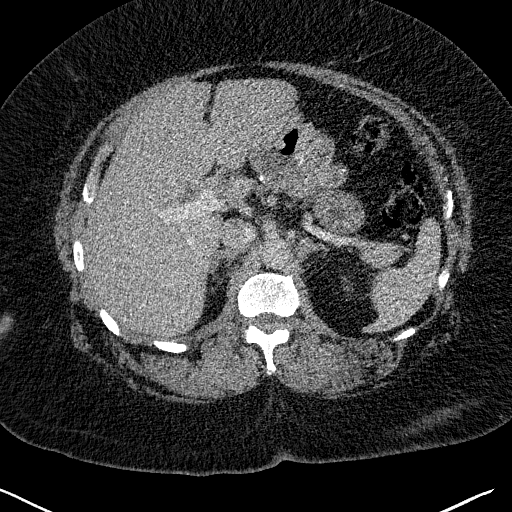
[im 12/151  lung]
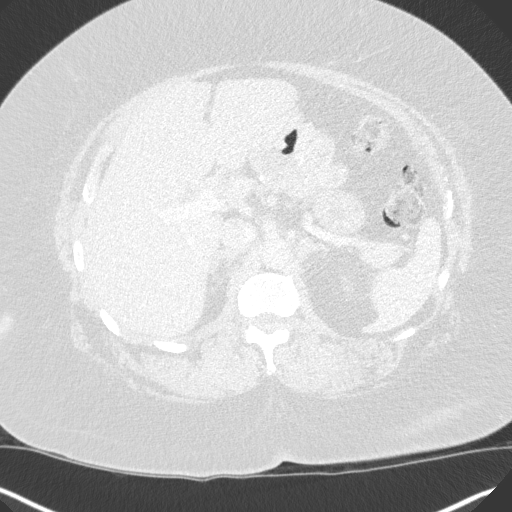
[im 23/151  lung]
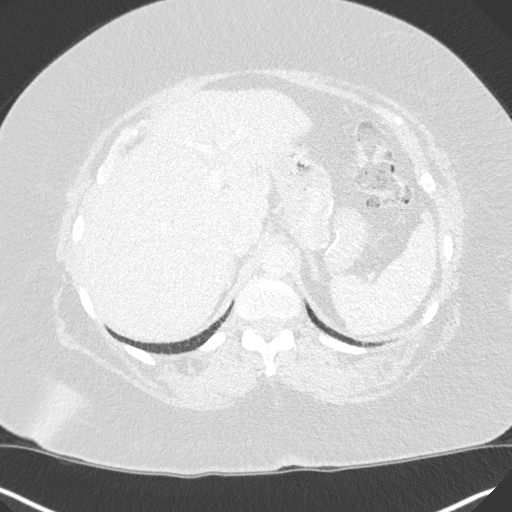
[im 34/151  lung]
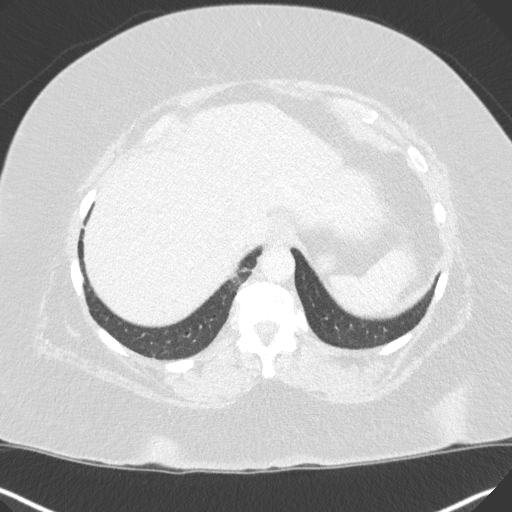
[im 45/151  lung]
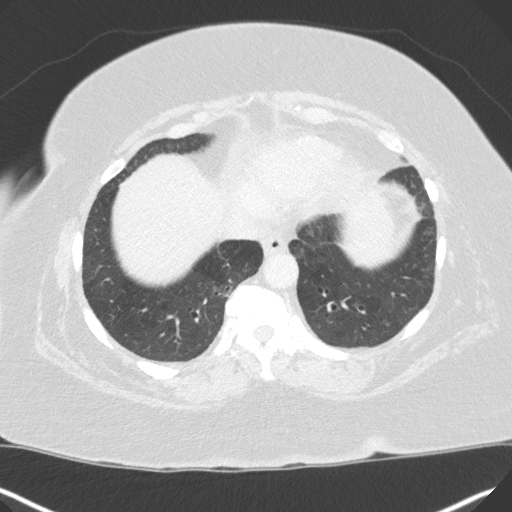
[im 56/151  mediastinal]
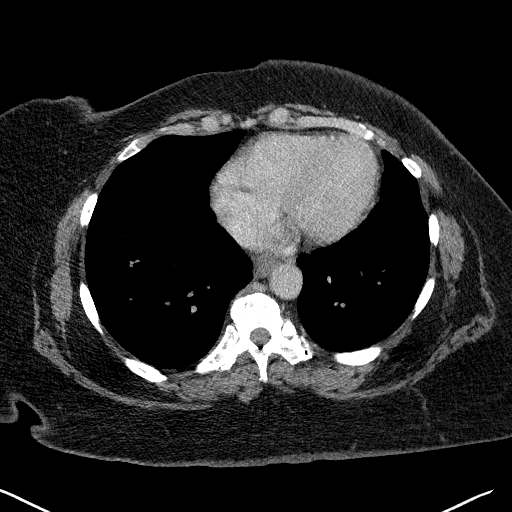
[im 56/151  lung]
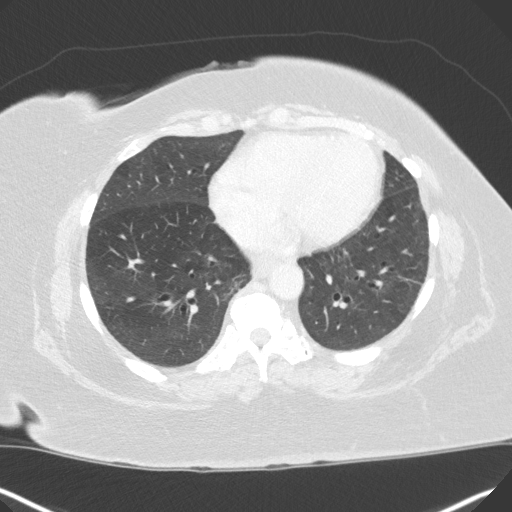
[im 67/151  lung]
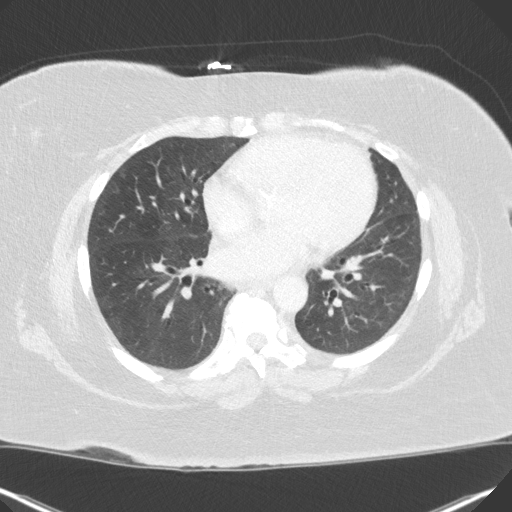
[im 78/151  lung]
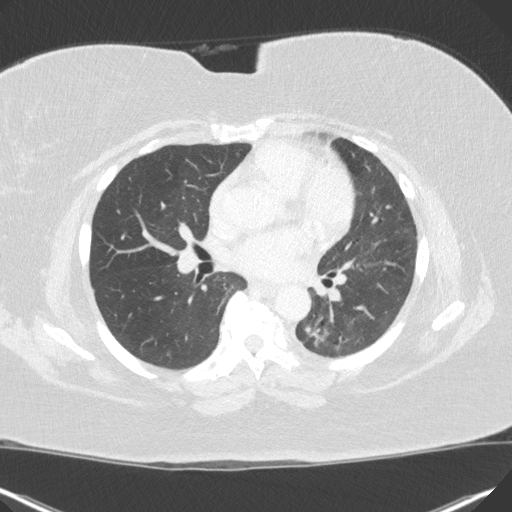
[im 89/151  lung]
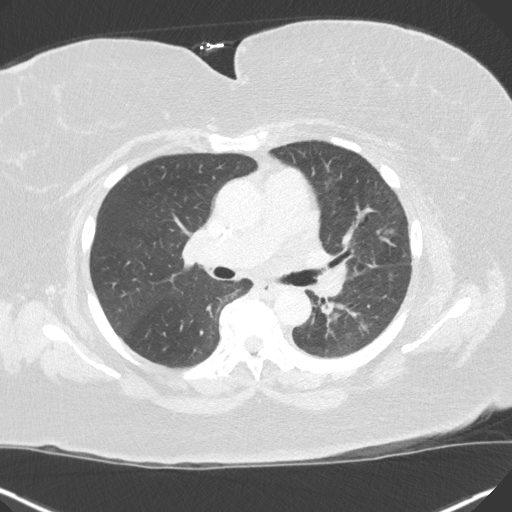
[im 101/151  mediastinal]
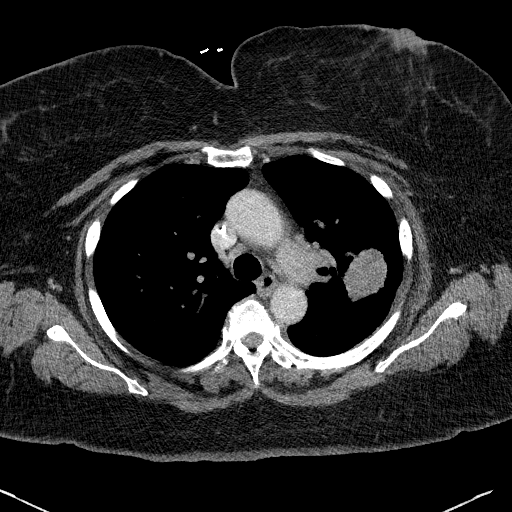
[im 101/151  lung]
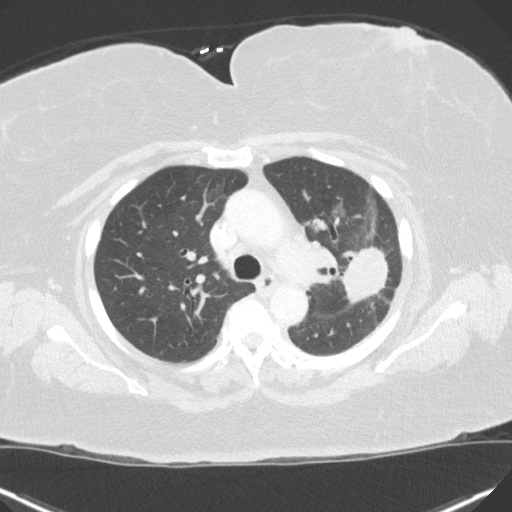
[im 112/151  lung]
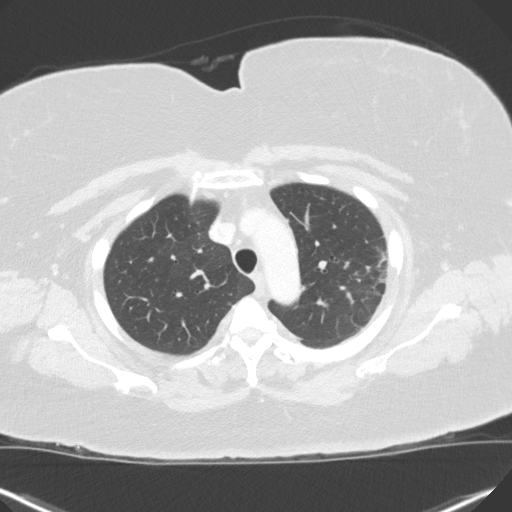
[im 123/151  lung]
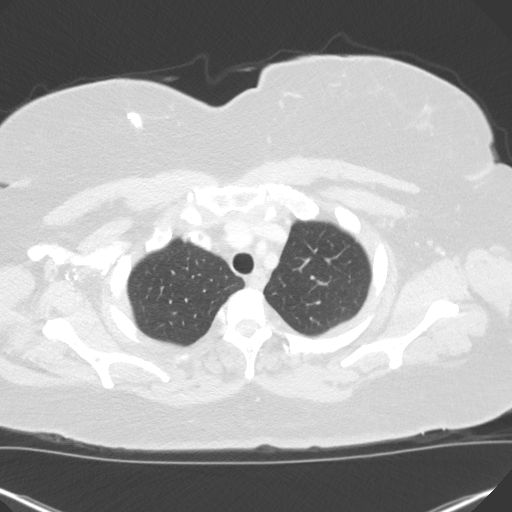
[im 134/151  lung]
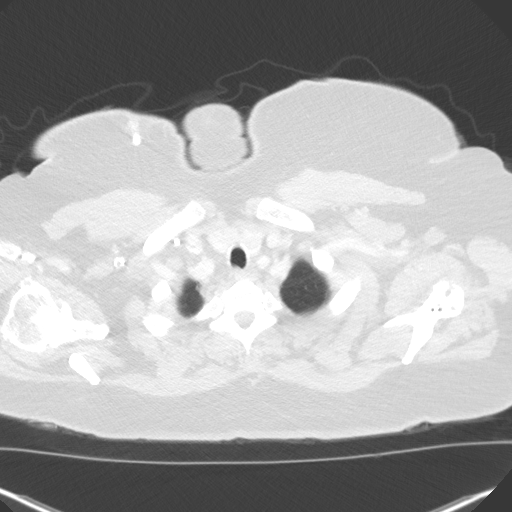

[Series 5: coronal · coronal · 0.69mm/px · 3 of 182 slices shown]
[im 37/182  lung]
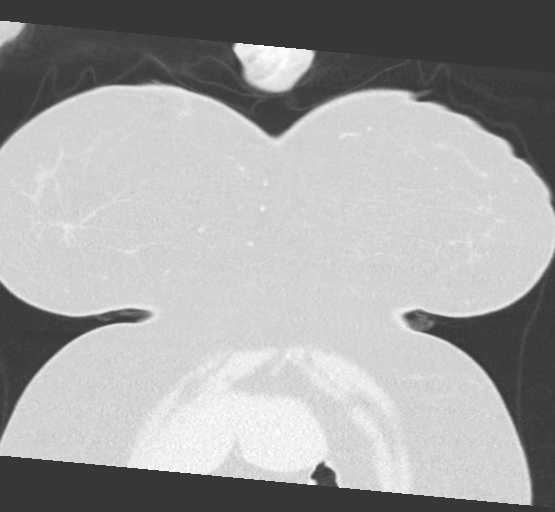
[im 73/182  lung]
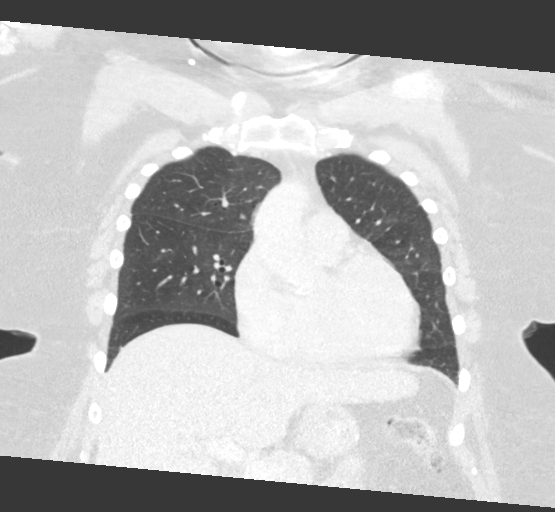
[im 109/182  lung]
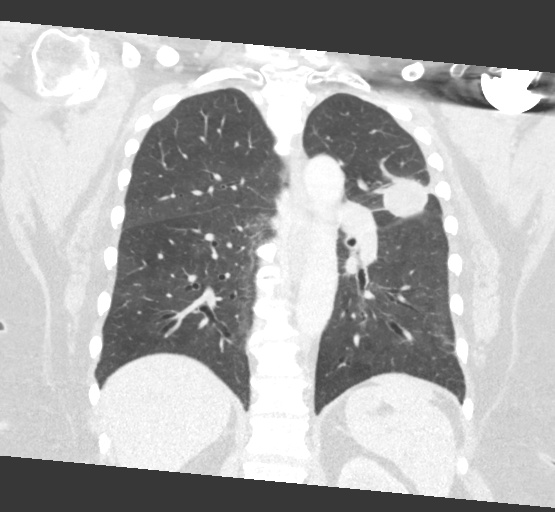

[15 of 36 positions shown; findings below may reference images not displayed]

FINDINGS: Cardiovascular: Right IJ Port-A-Cath terminates in the low SVC.
Atherosclerotic calcification of the aorta and aortic valve.
Pulmonic trunk is enlarged. Heart size within normal limits. No
pericardial effusion.

Mediastinum/Nodes: 7 mm AP window lymph node, minimally decreased
from 8 mm on [DATE]. Otherwise, no pathologically enlarged
mediastinal, hilar or axillary lymph nodes. Esophagus is grossly
unremarkable.

Lungs/Pleura: Mild scarring in the medial right lower lobe.
Spiculated left upper lobe mass has decreased in size, now measuring
3.3 x 4.0 cm, compared to 4.9 x 6.6 cm on [DATE]. Surrounding
patchy ground-glass in the left upper and left lower lobes, most
indicative of radiation therapy. No pleural fluid. Airway is
unremarkable.

Upper Abdomen: Well-circumscribed low-attenuation lesion in the left
hepatic lobe measures 1.9 cm, stable and likely a cyst. Marked
common bile duct dilatation (1.5 cm), likely related to
cholecystectomy, similar to the prior exam. Visualized portions of
the adrenal glands, kidneys, spleen and pancreas are unremarkable.
Postoperative changes in the stomach. No upper abdominal adenopathy.

Musculoskeletal: Degenerative changes in the spine and right
shoulder. Left shoulder arthroplasty.
IMPRESSION: 1. Reduction in size of the left upper lobe mass with evolving
changes of radiation therapy in the left hemithorax. No
pathologically enlarged mediastinal or hilar lymph nodes.
2.  Aortic atherosclerosis ([TF]-170.0).
3. Enlarged pulmonic trunk, indicative of pulmonary arterial
hypertension.

## 2019-03-03 MED ORDER — IOHEXOL 300 MG/ML  SOLN
75.0000 mL | Freq: Once | INTRAMUSCULAR | Status: AC | PRN
  Administered 2019-03-03: 10:00:00 75 mL via INTRAVENOUS

## 2019-03-05 ENCOUNTER — Inpatient Hospital Stay (HOSPITAL_BASED_OUTPATIENT_CLINIC_OR_DEPARTMENT_OTHER): Payer: Medicare Other | Admitting: Hematology

## 2019-03-05 ENCOUNTER — Encounter (HOSPITAL_COMMUNITY): Payer: Self-pay | Admitting: Hematology

## 2019-03-05 DIAGNOSIS — C3492 Malignant neoplasm of unspecified part of left bronchus or lung: Secondary | ICD-10-CM | POA: Diagnosis not present

## 2019-03-05 NOTE — Progress Notes (Signed)
DISCONTINUE ON PATHWAY REGIMEN - Non-Small Cell Lung     Administer weekly:     Paclitaxel      Carboplatin   **Always confirm dose/schedule in your pharmacy ordering system**  REASON: Continuation Of Treatment PRIOR TREATMENT: VUD314: Carboplatin AUC=2 + Paclitaxel 45 mg/m2 Weekly During Radiation TREATMENT RESPONSE: Partial Response (PR)  START ON PATHWAY REGIMEN - Non-Small Cell Lung     A cycle is every 14 days:     Durvalumab   **Always confirm dose/schedule in your pharmacy ordering system**  Patient Characteristics: Stage III - Unresectable, PS = 0, 1 AJCC T Category: T3 Current Disease Status: No Distant Mets or Local Recurrence AJCC N Category: N2 AJCC M Category: M0 AJCC 8 Stage Grouping: IIIB ECOG Performance Status: 0 Intent of Therapy: Curative Intent, Discussed with Patient

## 2019-03-05 NOTE — Progress Notes (Signed)
Virtual Visit via Telephone Note  I connected with Denise [name] on 03/05/19 at  4:00 PM EST by telephone and verified that I am speaking with the correct person using two identifiers.   I discussed the limitations, risks, security and privacy concerns of performing an evaluation and management service by telephone and the availability of in person appointments. I also discussed with the patient that there may be a patient responsible charge related to this service. The patient expressed understanding and agreed to proceed.   History of Present Illness: She was treated in our clinic for clinical stage IIIb (T3N2) adenocarcinoma the left lung: She completed chemoradiation therapy on 02/11/2019.  She also had MRI of the brain on 12/16/2018 which showed a meningioma.   Observations/Objective: She reports that her energy levels are slowly improving.  She still has some shortness of breath with exertion.  Appetite is 100%.  Energy levels are 75%.  Denies any tingling or numbness in extremities.  Denies any nausea or vomiting.  No cough or hemoptysis reported.  No chest pains reported.  Assessment and Plan:  1.  Stage IIIb (T3N2) adenocarcinoma the left lung: -Status post chemoradiation therapy with weekly carboplatin and paclitaxel completed on 02/11/2019. -We reviewed results of the CT of the chest with contrast dated 03/03/2019 which showed left upper lobe lung mass decreased in size measuring 3.3 x 4.0 cm, compared to 4.9 x 6.6 cm on 10/17/2018.  No pathologically enlarged mediastinal, hilar or axillary lymph nodes.  7 mm AP window lymph node, minimally decreased from 8 mm pretreatment. -Based on these findings, she achieved a partial response.  I have recommended consolidation immunotherapy with durvalumab.  This will be given for a total of 1 year.  We will initially start at every 2 weekly dose and transition to every 4 weeks if she tolerates well. -We talked about the side effects of immunotherapy  including but not limited to pneumonitis, hypophysitis, colitis, dermatitis among others. -She will be seen next week for formal chemo education.  She will tentatively start her immunotherapy in the week of 03/17/2019 which is within the 6-week window.  2.  Meningioma: -We reviewed repeat brain MRI dated 02/18/2019 meningioma shows meningioma of the greater wing of the sphenoid on the left measuring 2.5 x 2 x 2.5 cm.  Mass-effect upon the left temporal tip.  Vasogenic edema in the left temporal lobe.  She does not report any headaches or facial pain.   Follow Up Instructions: RTC 1 week for follow-up.   I discussed the assessment and treatment plan with the patient. The patient was provided an opportunity to ask questions and all were answered. The patient agreed with the plan and demonstrated an understanding of the instructions.   The patient was advised to call back or seek an in-person evaluation if the symptoms worsen or if the condition fails to improve as anticipated.  I provided 15 minutes of non-face-to-face time during this encounter.   Derek Jack, MD

## 2019-03-06 ENCOUNTER — Other Ambulatory Visit (HOSPITAL_COMMUNITY): Payer: Self-pay

## 2019-03-06 DIAGNOSIS — C3492 Malignant neoplasm of unspecified part of left bronchus or lung: Secondary | ICD-10-CM

## 2019-03-10 ENCOUNTER — Other Ambulatory Visit: Payer: Self-pay

## 2019-03-11 ENCOUNTER — Inpatient Hospital Stay (HOSPITAL_BASED_OUTPATIENT_CLINIC_OR_DEPARTMENT_OTHER): Payer: Medicare Other | Admitting: Hematology

## 2019-03-11 ENCOUNTER — Encounter (HOSPITAL_COMMUNITY): Payer: Self-pay | Admitting: Hematology

## 2019-03-11 DIAGNOSIS — C3492 Malignant neoplasm of unspecified part of left bronchus or lung: Secondary | ICD-10-CM

## 2019-03-11 DIAGNOSIS — Z5112 Encounter for antineoplastic immunotherapy: Secondary | ICD-10-CM | POA: Diagnosis not present

## 2019-03-11 NOTE — Progress Notes (Signed)
[place], [place] [address]   CLINIC:  Medical Oncology/Hematology  PCP:  Iona Beard, MD [address] [place] [address] [phone]   REASON FOR VISIT:  Stage III left lung adenocarcinoma.  CURRENT THERAPY: Consolidation with durvalumab.    INTERVAL HISTORY:  [name] 68 y.o. female seen for follow-up of stage III lung cancer.  She has completed chemoradiation therapy on 02/11/2019.  Has some baseline cough but clear expectoration which is also improving.  Reports shortness of breath on exertion.  This is gradually improving since completion of radiation.  Appetite is 100%.  Energy levels are 75%.  She is continuing to work from home.  Denies any nausea, vomiting or diarrhea.  No tingling or numbness in the extremities were seen.  Denies any left hemifacial pain.  REVIEW OF SYSTEMS:  Review of Systems  Respiratory: Positive for shortness of breath.   All other systems reviewed and are negative.    PAST MEDICAL/SURGICAL HISTORY:  Past Medical History:  Diagnosis Date  . Anemia   . Arthritis   . Diabetes mellitus    diet controlled  . GERD (gastroesophageal reflux disease)   . HTN (hypertension)   . HTN (hypertension)   . Port-A-Cath in place 01/06/2019  . Shortness of breath dyspnea    with exertion   Past Surgical History:  Procedure Laterality Date  . CHOLECYSTECTOMY  1997  . COLONOSCOPY    . GASTRIC BYPASS    . INCISIONAL HERNIA REPAIR  04/11/11  . IR IMAGING GUIDED PORT INSERTION  12/27/2018  . LAPAROSCOPIC SALPINGOOPHERECTOMY    . LAPAROTOMY  04/11/2011   Procedure: EXPLORATORY LAPAROTOMY;  Surgeon: Joyice Faster. Cornett, MD;  Location: [facility];  Service: General;  Laterality: N/A;  closure port hole  . TOTAL HIP ARTHROPLASTY  03/07/2012   Procedure: TOTAL HIP ARTHROPLASTY ANTERIOR APPROACH;  Surgeon: Mauri Pole, MD;  Location: [facility];  Service: Orthopedics;  Laterality: Right;  . TOTAL SHOULDER ARTHROPLASTY  Left 01/21/2015  . TOTAL SHOULDER ARTHROPLASTY Left 01/21/2015   Procedure: LEFT TOTAL SHOULDER ARTHROPLASTY;  Surgeon: Justice Britain, MD;  Location: [facility];  Service: Orthopedics;  Laterality: Left;  Marland Kitchen VAGINAL HYSTERECTOMY       SOCIAL HISTORY:  Social History   Socioeconomic History  . Marital status: Single    Spouse name: Not on file  . Number of children: Not on file  . Years of education: 12th grade  . Highest education level: Not on file  Occupational History  . Occupation: Employed    Employer: [employer]  Tobacco Use  . Smoking status: Former Smoker    Packs/day: 1.00    Years: 12.00    Pack years: 12.00    Types: Cigarettes    Quit date: 03/20/1976    Years since quitting: 43.0  . Smokeless tobacco: Never Used  Substance and Sexual Activity  . Alcohol use: No  . Drug use: No  . Sexual activity: Never  Other Topics Concern  . Not on file  Social History Narrative  . Not on file   Social Determinants of Health   Financial Resource Strain: Low Risk   . Difficulty of Paying Living Expenses: Not very hard  Food Insecurity: No Food Insecurity  . Worried About Charity fundraiser in the Last Year: Never true  . Ran Out of Food in the Last Year: Never true  Transportation Needs: No Transportation Needs  . Lack  of Transportation (Medical): No  . Lack of Transportation (Non-Medical): No  Physical Activity: Inactive  . Days of Exercise per Week: 0 days  . Minutes of Exercise per Session: 0 min  Stress: No Stress Concern Present  . Feeling of Stress : Only a little  Social Connections: Somewhat Isolated  . Frequency of Communication with Friends and Family: More than three times a week  . Frequency of Social Gatherings with Friends and Family: Once a week  . Attends Religious Services: More than 4 times per year  . Active Member of Clubs or Organizations: No  . Attends Archivist Meetings: Never  . Marital Status: Never married  Intimate  Partner Violence: Not At Risk  . Fear of Current or Ex-Partner: No  . Emotionally Abused: No  . Physically Abused: No  . Sexually Abused: No    FAMILY HISTORY:  Family History  Problem Relation Age of Onset  . Breast cancer Mother   . COPD Mother   . Arthritis Mother   . Diabetes Mother   . Hypertension Mother   . Hypertension Father   . Diabetes Father   . Breast cancer Sister   . Thyroid cancer Brother   . Huntington's disease Maternal Grandmother   . Heart attack Brother     CURRENT MEDICATIONS:  Outpatient Encounter Medications as of 03/11/2019  Medication Sig  . amLODipine (NORVASC) 5 MG tablet Take 5 mg by mouth every morning.   . Calcium Carbonate Antacid (TUMS PO) Take 4 tablets by mouth daily.   Marland Kitchen CARBOPLATIN IV Inject into the vein once a week.  . Cyanocobalamin (VITAMIN B-12) 2500 MCG SUBL Place 2,500 mcg under the tongue every morning.   . ferrous sulfate 325 (65 FE) MG tablet Take 1 tablet (325 mg total) by mouth 3 (three) times daily after meals. (Patient taking differently: Take 325 mg by mouth daily. )  . Multiple Vitamin (MULITIVITAMIN WITH MINERALS) TABS Take 1 tablet by mouth every morning.   Marland Kitchen omeprazole (PRILOSEC) 20 MG capsule Take 20 mg by mouth daily.  Marland Kitchen PACLitaxel (TAXOL IV) Inject into the vein once a week.  . ALPRAZolam (XANAX) 0.25 MG tablet Take 0.25 mg by mouth as needed.   . gabapentin (NEURONTIN) 300 MG capsule Take 300 mg by mouth at bedtime as needed for pain.  Marland Kitchen ibuprofen (ADVIL,MOTRIN) 800 MG tablet Take 1 tablet (800 mg total) by mouth 3 (three) times daily. (Patient not taking: Reported on 03/05/2019)  . naproxen (NAPROSYN) 500 MG tablet Take 500 mg by mouth as needed.   . [DISCONTINUED] prochlorperazine (COMPAZINE) 10 MG tablet Take 1 tablet (10 mg total) by mouth every 6 (six) hours as needed (Nausea or vomiting). (Patient not taking: Reported on 03/05/2019)   No facility-administered encounter medications on file as of 03/11/2019.     ALLERGIES:  No Known Allergies   PHYSICAL EXAM:  ECOG Performance status: 0  Vitals:   03/11/19 1605  BP: 130/81  Pulse: 97  Resp: 20  Temp: (!) 97.1 F (36.2 C)  SpO2: 99%   Filed Weights   03/11/19 1605  Weight: 282 lb 4.8 oz (128.1 kg)    Physical Exam Vitals reviewed.  Constitutional:      Appearance: Normal appearance.  Cardiovascular:     Rate and Rhythm: Normal rate and regular rhythm.     Heart sounds: Normal heart sounds.  Pulmonary:     Effort: Pulmonary effort is normal.     Breath sounds: Normal breath  sounds.  Abdominal:     General: There is no distension.     Palpations: Abdomen is soft. There is no mass.  Musculoskeletal:        General: Swelling present.  Lymphadenopathy:     Cervical: No cervical adenopathy.  Skin:    General: Skin is warm.  Neurological:     General: No focal deficit present.     Mental Status: She is alert and oriented to person, place, and time.  Psychiatric:        Mood and Affect: Mood normal.        Behavior: Behavior normal.      LABORATORY DATA:  I have reviewed the labs as listed.  CBC    Component Value Date/Time   WBC 2.7 (L) 03/03/2019 1057   RBC 4.05 03/03/2019 1057   HGB 10.6 (L) 03/03/2019 1057   HCT 35.5 (L) 03/03/2019 1057   PLT 269 03/03/2019 1057   MCV 87.7 03/03/2019 1057   MCH 26.2 03/03/2019 1057   MCHC 29.9 (L) 03/03/2019 1057   RDW 19.7 (H) 03/03/2019 1057   LYMPHSABS 0.5 (L) 03/03/2019 1057   MONOABS 0.4 03/03/2019 1057   EOSABS 0.1 03/03/2019 1057   BASOSABS 0.0 03/03/2019 1057   CMP Latest Ref Rng & Units 03/03/2019 02/11/2019 02/04/2019  Glucose 70 - 99 mg/dL 96 143(H) 95  BUN 8 - 23 mg/dL 11 14 13   Creatinine 0.44 - 1.00 mg/dL 0.69 0.72 0.65  Sodium 135 - 145 mmol/L 144 141 141  Potassium 3.5 - 5.1 mmol/L 4.4 3.9 4.1  Chloride 98 - 111 mmol/L 107 108 108  CO2 22 - 32 mmol/L 28 25 25   Calcium 8.9 - 10.3 mg/dL 9.5 8.8(L) 8.9  Total Protein 6.5 - 8.1 g/dL 6.7 6.8 6.6   Total Bilirubin 0.3 - 1.2 mg/dL 0.5 0.6 0.3  Alkaline Phos 38 - 126 U/L 103 109 113  AST 15 - 41 U/L 17 18 16   ALT 0 - 44 U/L 17 19 19        DIAGNOSTIC IMAGING:  I have independently reviewed the scans and discussed with the patient.    ASSESSMENT & PLAN:   Adenocarcinoma of left lung ([place]) 1.  Clinical stage IIIb (T3N2) adenocarcinoma the left lung: -Presentation with hemoptysis, CT chest on 10/17/2018 showed left upper lobe lung mass, and enlarged AP window lymph node. -CT-guided biopsy on 11/19/2018 consistent with adenocarcinoma.  She smoked 1 pack/day for 15 years, quit 25 years ago. -PET scan on 12/17/2018 shows 7.3 cm left upper lobe lung mass, 1 cm short axis AP window lymph node with SUV 3.4.  Focus of increased metabolic activity in the cecum without definite CT correlate.  She reportedly had colonoscopy 3 to 4 years ago which was normal. -Chemoradiation therapy with carboplatin and paclitaxel weekly from 01/07/2019 through 02/11/2019.   -We reviewed results and images of the CT of the chest from 03/03/2019 which showed reduction in size of the left upper lobe lung mass with evolving changes of radiation therapy in the left hemithorax.  No pathologically enlarged mediastinal or hilar lymph nodes.  The left upper lobe mass currently measures 3.3 x 4 cm.  It was compared to 4.9 x 6.6 cm. -We talked about consolidation therapy with durvalumab and its effect on improvement in overall survival.  We discussed side effects including but not limited to pneumonitis, diarrhea from colitis, skin rashes among others. -She will come back on 03/19/2019 to initiate durvalumab. -We we will continue to  scan her every 2 to 3 months to follow-up on the lung lesion.  2.  Meningioma: -MRI of the brain on 12/16/2018 showed 1.8 x 2.6 x 2.6 cm enhancing extra-axial mass along the left greater wing of sphenoid.  Mass has imaging appearance of meningioma.  However dural based metastatic lesion cannot be  excluded. -She does not have any left-sided facial pain. -MRI of the brain on 02/18/2019 showed similar appearance of meningioma of the greater wing of the sphenoid measuring 2.5 x 2 x 2.5 cm.  3.  Nausea/vomiting: -She did report some nausea but denied any vomiting.  We will continue Compazine as needed.   Orders placed this encounter:  No orders of the defined types were placed in this encounter.     Derek Jack, MD [place] [phone]

## 2019-03-11 NOTE — Patient Instructions (Signed)
[place] at [facility] Discharge Instructions  You were seen today by Dr. Delton Coombes. He went over your recent scan results. He will see you back as scheduled for labs, treatment and follow up.   Thank you for choosing [place] at [facility] to provide your oncology and hematology care.  To afford each patient quality time with our provider, please arrive at least 15 minutes before your scheduled appointment time.   If you have a lab appointment with the [facility] please come in thru the  Main Entrance and check in at the main information desk  You need to re-schedule your appointment should you arrive 10 or more minutes late.  We strive to give you quality time with our providers, and arriving late affects you and other patients whose appointments are after yours.  Also, if you no show three or more times for appointments you may be dismissed from the clinic at the providers discretion.     Again, thank you for choosing [facility].  Our hope is that these requests will decrease the amount of time that you wait before being seen by our physicians.       _____________________________________________________________  Should you have questions after your visit to [facility], please contact our office at [phone] between the hours of 8:00 a.m. and 4:30 p.m.  Voicemails left after 4:00 p.m. will not be returned until the following business day.  For prescription refill requests, have your pharmacy contact our office and allow 72 hours.    Cancer Center Support Programs:   > Cancer Support Group  2nd Tuesday of the month 1pm-2pm, Journey Room

## 2019-03-11 NOTE — Assessment & Plan Note (Signed)
1.  Clinical stage IIIb (T3N2) adenocarcinoma the left lung: -Presentation with hemoptysis, CT chest on 10/17/2018 showed left upper lobe lung mass, and enlarged AP window lymph node. -CT-guided biopsy on 11/19/2018 consistent with adenocarcinoma.  She smoked 1 pack/day for 15 years, quit 25 years ago. -PET scan on 12/17/2018 shows 7.3 cm left upper lobe lung mass, 1 cm short axis AP window lymph node with SUV 3.4.  Focus of increased metabolic activity in the cecum without definite CT correlate.  She reportedly had colonoscopy 3 to 4 years ago which was normal. -Chemoradiation therapy with carboplatin and paclitaxel weekly from 01/07/2019 through 02/11/2019.   -We reviewed results and images of the CT of the chest from 03/03/2019 which showed reduction in size of the left upper lobe lung mass with evolving changes of radiation therapy in the left hemithorax.  No pathologically enlarged mediastinal or hilar lymph nodes.  The left upper lobe mass currently measures 3.3 x 4 cm.  It was compared to 4.9 x 6.6 cm. -We talked about consolidation therapy with durvalumab and its effect on improvement in overall survival.  We discussed side effects including but not limited to pneumonitis, diarrhea from colitis, skin rashes among others. -She will come back on 03/19/2019 to initiate durvalumab. -We we will continue to scan her every 2 to 3 months to follow-up on the lung lesion.  2.  Meningioma: -MRI of the brain on 12/16/2018 showed 1.8 x 2.6 x 2.6 cm enhancing extra-axial mass along the left greater wing of sphenoid.  Mass has imaging appearance of meningioma.  However dural based metastatic lesion cannot be excluded. -She does not have any left-sided facial pain. -MRI of the brain on 02/18/2019 showed similar appearance of meningioma of the greater wing of the sphenoid measuring 2.5 x 2 x 2.5 cm.  3.  Nausea/vomiting: -She did report some nausea but denied any vomiting.  We will continue Compazine as needed.

## 2019-03-18 ENCOUNTER — Inpatient Hospital Stay (HOSPITAL_COMMUNITY): Payer: Medicare Other

## 2019-03-18 NOTE — Patient Instructions (Signed)
Durvalumab        (dur-VAL-ue-mab)  Trade Name(s): Imfinzi  You will receive durvalumab (Imfinzi) through your port a cath once every two weeks. You will be monitored for an infusion reaction which is a rare occurrence.  Side Effects  Important things to remember about the side effects of durvalumab: Most people will not experience all of the durvalumab side effects listed. Side effects are often predictable in terms of their onset, duration, and severity. Side effects are almost always reversible and will go away after therapy is complete. Side effects may be quite manageable. There are many options to minimize or prevent side effects of durvalumab.  The following side effects are common (occurring in greater than 30%) for patients taking durvalumab:  Fatigue Infection  These are less common side effects (occurring in 10-29%) for patients receiving durvalumab:  Muscle and/or bone pain Constipation Decreased appetite Rash Nausea Swelling Urinary tract infections Abdominal pain Fever Colitis Diarrhea Decreased sodium level Decreased lymphocyte count (a type of white blood cell)  The following are rare but serious complications of durvalumab therapy triggered by an auto-immune reaction where the immune system goes after normal cells in the body. This can happen at any time while taking, and/or after stopping durvalumab. Contact your healthcare provider immediately if you have signs/symptoms of the following:  Pneumonitis (lung problems) identified by:  New or worsening cough Shortness of breath Chest pain  Hepatitis (liver problems) identified by:  Yellowing of your skin or the whites of your eyes Severe nausea and vomiting Pain on the right side of your stomach Dark, tea colored urine Bleeding or bruising more easily than normal  Colitis (intestinal problems) identified by:  Diarrhea Blood in your stools or dark, tarry stool Severe stomach pain or  tenderness  Hormone gland problems (thyroid gland, adrenal gland, and pancreas) identified by:  Rapid heart beat Increased sweating Extreme tiredness Unexpected weight gain or loss Feeling more hungry or thirsty High blood sugar Hair loss Irritability or forgetfulness Constipation Deepening of your voice Low blood pressure More frequent urination Stomach pain Kidney problems identified by: Less frequent urination Blood in your urine Ankle swelling Loss of appetite  Other organ problems:  Headache, change in balance, confusion Severe muscle weakness or pain Chest pain and tightness Trouble breathing Skin rash Change in heartbeat Flu like symptoms  Not all side effects are listed above. Side effects that are very rare -- occurring in less than about 10 percent of patients -- are not listed here. But you should always inform your health care provider if you experience any unusual symptoms.  When to Contact Your Doctor or [facility] Provider  Contact your health care provider immediately, day or night, if you should experience any of the following symptoms:  Fever of 100.4 F (38 C) or higher, chills (possible signs of infection) Shortness of breath, cough Confusion, imbalance  The following symptoms require medical attention, but are not an emergency. Contact your health care provider within 24 hours of noticing any of the following:  Nausea (interferes with ability to eat and unrelieved with prescribed medication) Vomiting (vomiting more than 4-5 times in a 24-hour period) Diarrhea (4-6 episodes in a 24-hour period) Unusual bleeding or bruising Black or tarry stools, or blood in your stools Blood in the urine Pain or burning with urination Extreme fatigue (unable to carry on self-care activities) Yellowing of skin or eyes Constipation unrelieved by laxatives use. Always inform your health care provider if you experience any unusual  symptoms.  Precautions  Before starting durvalumab treatment, make sure you tell your doctor about any other medications you are taking (including prescription, over-the-counter, vitamins, herbal remedies, etc.).  Do not receive any kind of immunization or vaccination without your doctor's approval while taking durvalumab. Inform your health care professional if you are pregnant or may be pregnant prior to starting this treatment.   Pregnancy category X (Durvalumab may cause fetal harm when given to a pregnant woman. This drug must not be given to a pregnant woman or a woman who intends to become pregnant. If a woman becomes pregnant while taking durvalumab, the medication must be stopped immediately and the woman given appropriate counseling.  For both men and women: Use contraceptives, and do not conceive a child (get pregnant) while taking durvalumab. Barrier methods of contraception, such as condoms, are recommended for up to 3 months after last dose of durvalumab.  Do not breast feed while taking durvalumab or for at least 3 months after last dose of durvalumab.  Self-Care Tips  Drink at least two to three quarts of fluid every 24 hours, unless you are instructed otherwise.  You may be at risk of infection so try to avoid crowds or people with colds, and report fever or any other signs of infection immediately to your health care provider.  Wash your hands often.  To reduce nausea, take anti-nausea medication as prescribed by your doctor, and eat small, frequent meals.  Follow regimen of anti-diarrhea medication as prescribed by your health care professional.  Eat foods that may help reduce diarrhea (see managing side effects - diarrhea). In general, drinking alcoholic beverages should be kept to a minimum or avoided completely. You should discuss this with your doctor.  Get plenty of rest.  Maintain good nutrition.  Remain active as you are able. Gentle exercise is encouraged  such as a daily walk.  If you experience symptoms or side effects, be sure to discuss them with your health care team. They can prescribe medications and/or offer other suggestions that are effective in managing such problems.  Monitoring and Testing While Taking Durvalumab  You will be checked regularly by your doctor while you are taking durvalumab to monitor side effects and check your response to therapy. Periodic blood work will be obtained to monitor your complete blood count (CBC), glucose, as well as the function of other organs (such as your kidneys, liver, thyroid) will also be ordered by your doctor.  How Durvalumab Works  Monoclonal antibodies are a relatively new type of "targeted" cancer therapy. Antibodies are an important part of the body's immune system. Normally, antibodies are produced by the body in response to an antigen, such as protein in a germ that the body recognizes as foreign. The antibodies attach to the antigen in order to mark it for destruction by the immune system.  Since monoclonal antibodies target only specific cells, they generally cause less toxicity to healthy cells. A limitation to monoclonal antibody use is that they can only be used for cancer in which antigens (and antibodies that bind them) have been identified.  Programmed death ligand 1 (PD-L1) is a specific protein (an immune check point protein) on tumor cells that binds to programmed death 1 (PD-1) on our body's anti-tumor t-cells which stops our body's t-cells from attacking the tumor cells. Durvalumab is a human immunoglobulin monoclonal antibody in a class called check point inhibitors. Durvalumab blocks PD-L1 on tumor cells from binding to PD-1 and CD80 on our t-cells.  This allows for increased t-cell activation, which then frees up our antitumor t-cells and allows them to attack the cancer cells.

## 2019-03-19 ENCOUNTER — Other Ambulatory Visit: Payer: Self-pay

## 2019-03-19 ENCOUNTER — Encounter (HOSPITAL_COMMUNITY): Payer: Self-pay

## 2019-03-19 ENCOUNTER — Inpatient Hospital Stay (HOSPITAL_COMMUNITY): Payer: Medicare Other

## 2019-03-19 VITALS — BP 124/62 | HR 82 | Temp 97.8°F | Resp 18 | Wt 280.8 lb

## 2019-03-19 DIAGNOSIS — Z5112 Encounter for antineoplastic immunotherapy: Secondary | ICD-10-CM | POA: Diagnosis not present

## 2019-03-19 DIAGNOSIS — Z95828 Presence of other vascular implants and grafts: Secondary | ICD-10-CM

## 2019-03-19 DIAGNOSIS — C3492 Malignant neoplasm of unspecified part of left bronchus or lung: Secondary | ICD-10-CM

## 2019-03-19 LAB — COMPREHENSIVE METABOLIC PANEL
ALT: 18 U/L (ref 0–44)
AST: 16 U/L (ref 15–41)
Albumin: 3.4 g/dL — ABNORMAL LOW (ref 3.5–5.0)
Alkaline Phosphatase: 93 U/L (ref 38–126)
Anion gap: 12 (ref 5–15)
BUN: 10 mg/dL (ref 8–23)
CO2: 24 mmol/L (ref 22–32)
Calcium: 8.8 mg/dL — ABNORMAL LOW (ref 8.9–10.3)
Chloride: 106 mmol/L (ref 98–111)
Creatinine, Ser: 0.68 mg/dL (ref 0.44–1.00)
GFR calc Af Amer: >60 mL/min (ref >60)
GFR calc non Af Amer: >60 mL/min (ref >60)
Glucose, Bld: 113 mg/dL — ABNORMAL HIGH (ref 70–99)
Potassium: 3.8 mmol/L (ref 3.5–5.1)
Sodium: 142 mmol/L (ref 135–145)
Total Bilirubin: 0.4 mg/dL (ref 0.3–1.2)
Total Protein: 6.6 g/dL (ref 6.5–8.1)

## 2019-03-19 LAB — CBC WITH DIFFERENTIAL/PLATELET
Abs Immature Granulocytes: 0.01 10*3/uL (ref 0.00–0.07)
Basophils Absolute: 0 10*3/uL (ref 0.0–0.1)
Basophils Relative: 1 %
Eosinophils Absolute: 0.1 10*3/uL (ref 0.0–0.5)
Eosinophils Relative: 2 %
HCT: 36 % (ref 36.0–46.0)
Hemoglobin: 10.8 g/dL — ABNORMAL LOW (ref 12.0–15.0)
Immature Granulocytes: 0 %
Lymphocytes Relative: 15 %
Lymphs Abs: 0.6 10*3/uL — ABNORMAL LOW (ref 0.7–4.0)
MCH: 26.5 pg (ref 26.0–34.0)
MCHC: 30 g/dL (ref 30.0–36.0)
MCV: 88.2 fL (ref 80.0–100.0)
Monocytes Absolute: 0.4 10*3/uL (ref 0.1–1.0)
Monocytes Relative: 11 %
Neutro Abs: 2.7 10*3/uL (ref 1.7–7.7)
Neutrophils Relative %: 71 %
Platelets: 372 10*3/uL (ref 150–400)
RBC: 4.08 MIL/uL (ref 3.87–5.11)
RDW: 19.7 % — ABNORMAL HIGH (ref 11.5–15.5)
WBC: 3.9 10*3/uL — ABNORMAL LOW (ref 4.0–10.5)
nRBC: 0 % (ref 0.0–0.2)

## 2019-03-19 LAB — TSH: TSH: 1.708 u[IU]/mL (ref 0.350–4.500)

## 2019-03-19 MED ORDER — SODIUM CHLORIDE 0.9 % IV SOLN: Freq: Once | INTRAVENOUS | Status: AC

## 2019-03-19 MED ORDER — SODIUM CHLORIDE 0.9% FLUSH
10.0000 mL | INTRAVENOUS | Status: DC | PRN
  Administered 2019-03-19: 10 mL

## 2019-03-19 MED ORDER — SODIUM CHLORIDE 0.9 % IV SOLN
9.9000 mg/kg | Freq: Once | INTRAVENOUS | Status: AC
  Administered 2019-03-19: 1240 mg via INTRAVENOUS
  Filled 2019-03-19: qty 20

## 2019-03-19 MED ORDER — HEPARIN SOD (PORK) LOCK FLUSH 100 UNIT/ML IV SOLN
500.0000 [IU] | Freq: Once | INTRAVENOUS | Status: AC | PRN
  Administered 2019-03-19: 12:00:00 500 [IU]

## 2019-03-19 NOTE — Progress Notes (Signed)
[id number] Drug-specific information on Imfinzi reviewed with the pt and copy given to pt who verbalized understanding. Permit signed.                                                                                             [name] tolerated Imfinzi infusion well without complaints or incident. Labs reviewed prior to administering this medication. VSS upon discharge. Pt discharged self ambulatory in satisfactory condition

## 2019-03-20 ENCOUNTER — Telehealth (HOSPITAL_COMMUNITY): Payer: Self-pay

## 2019-03-20 ENCOUNTER — Other Ambulatory Visit: Payer: Self-pay

## 2019-03-20 ENCOUNTER — Encounter: Payer: Self-pay | Admitting: Radiation Oncology

## 2019-03-20 ENCOUNTER — Ambulatory Visit
Admission: RE | Admit: 2019-03-20 | Discharge: 2019-03-20 | Disposition: A | Discharge status: Home / Self Care | Payer: Medicare Other | Source: Ambulatory Visit | Attending: Radiation Oncology | Admitting: Radiation Oncology

## 2019-03-20 ENCOUNTER — Inpatient Hospital Stay: Payer: Medicare Other | Attending: Internal Medicine | Admitting: Internal Medicine

## 2019-03-20 VITALS — BP 152/71 | HR 96 | Temp 98.0°F | Resp 18 | Ht 62.5 in | Wt 283.7 lb

## 2019-03-20 VITALS — BP 131/80 | HR 91 | Temp 98.0°F | Resp 18 | Ht 62.5 in | Wt 283.2 lb

## 2019-03-20 DIAGNOSIS — I10 Essential (primary) hypertension: Secondary | ICD-10-CM | POA: Insufficient documentation

## 2019-03-20 DIAGNOSIS — Z803 Family history of malignant neoplasm of breast: Secondary | ICD-10-CM | POA: Insufficient documentation

## 2019-03-20 DIAGNOSIS — Z923 Personal history of irradiation: Secondary | ICD-10-CM | POA: Insufficient documentation

## 2019-03-20 DIAGNOSIS — Z833 Family history of diabetes mellitus: Secondary | ICD-10-CM | POA: Diagnosis not present

## 2019-03-20 DIAGNOSIS — I700 Atherosclerosis of aorta: Secondary | ICD-10-CM | POA: Insufficient documentation

## 2019-03-20 DIAGNOSIS — Z79899 Other long term (current) drug therapy: Secondary | ICD-10-CM | POA: Insufficient documentation

## 2019-03-20 DIAGNOSIS — Z87891 Personal history of nicotine dependence: Secondary | ICD-10-CM | POA: Insufficient documentation

## 2019-03-20 DIAGNOSIS — Z90710 Acquired absence of both cervix and uterus: Secondary | ICD-10-CM | POA: Insufficient documentation

## 2019-03-20 DIAGNOSIS — Z8249 Family history of ischemic heart disease and other diseases of the circulatory system: Secondary | ICD-10-CM | POA: Diagnosis not present

## 2019-03-20 DIAGNOSIS — Z8261 Family history of arthritis: Secondary | ICD-10-CM | POA: Diagnosis not present

## 2019-03-20 DIAGNOSIS — E119 Type 2 diabetes mellitus without complications: Secondary | ICD-10-CM | POA: Diagnosis not present

## 2019-03-20 DIAGNOSIS — C3492 Malignant neoplasm of unspecified part of left bronchus or lung: Secondary | ICD-10-CM | POA: Diagnosis not present

## 2019-03-20 DIAGNOSIS — D320 Benign neoplasm of cerebral meninges: Secondary | ICD-10-CM | POA: Insufficient documentation

## 2019-03-20 DIAGNOSIS — D329 Benign neoplasm of meninges, unspecified: Secondary | ICD-10-CM

## 2019-03-20 DIAGNOSIS — Z791 Long term (current) use of non-steroidal anti-inflammatories (NSAID): Secondary | ICD-10-CM | POA: Insufficient documentation

## 2019-03-20 DIAGNOSIS — Z9884 Bariatric surgery status: Secondary | ICD-10-CM | POA: Insufficient documentation

## 2019-03-20 NOTE — Telephone Encounter (Signed)
24hr New Chemo F/U call : Pt reports feeling well today after receiving Imfinzi(new medication)yesterday. She denies any fever,chills,N+V,diarrhea,dyspnea or pain. Pt instructed to call for any problems or questions and verbalized understanding

## 2019-03-20 NOTE — Progress Notes (Signed)
Pt presents today for f/u with Dr. Sondra Come. Pt denies c/o pain. Pt reports morning cough with clear phlegm which resolves any further wheezing. Pt denies any cough otherwise. Pt denies hemoptysis. Pt reports SOB with exertion. Pt denies difficulty swallowing.  BP 131/80 (BP Location: Left Arm, Patient Position: Sitting)   Pulse 91   Temp 98 F (36.7 C) (Temporal)   Resp 18   Ht 5' 2.5" (1.588 m)   Wt 283 lb 4 oz (128.5 kg)   SpO2 98%   BMI 50.98 kg/m   Wt Readings from Last 3 Encounters:  03/20/19 283 lb 4 oz (128.5 kg)  03/19/19 280 lb 12.8 oz (127.4 kg)  03/11/19 282 lb 4.8 oz (128.1 kg)   Loma Sousa, RN BSN

## 2019-03-20 NOTE — Progress Notes (Signed)
[facility] [place] [place], [place] [address] [phone]   New Patient Evaluation  Date of Service: 03/20/19 Patient Name: Denise [name] Patient MRN: [id number] Patient DOB: 15-Mar-1951 Provider: Ventura Sellers, MD  Identifying Statement:  [name] is a 68 y.o. female with left temporal meningioma who presents for initial consultation and evaluation.    Referring Provider: Iona Beard, MD [address] [place],  [place] [address]  Oncologic History: Oncology History  Adenocarcinoma of left lung ([place])  12/09/2018 Initial Diagnosis   Adenocarcinoma of left lung ([place])   01/02/2019 Cancer Staging   Staging form: Lung, AJCC 8th Edition - Clinical stage from 01/02/2019: Stage IIIB (cT3, cN2, cM0) - Signed by Derek Jack, MD on 01/02/2019   01/07/2019 - 02/17/2019 Chemotherapy   The patient had palonosetron (ALOXI) injection 0.25 mg, 0.25 mg, Intravenous,  Once, 6 of 6 cycles Administration: 0.25 mg (01/07/2019), 0.25 mg (01/14/2019), 0.25 mg (01/21/2019), 0.25 mg (01/28/2019), 0.25 mg (02/04/2019), 0.25 mg (02/11/2019) CARBOplatin (PARAPLATIN) 270 mg in sodium chloride 0.9 % 250 mL chemo infusion, 270 mg (100 % of original dose 266.4 mg), Intravenous,  Once, 6 of 6 cycles Dose modification:   (original dose 266.4 mg, Cycle 1),   (original dose 266.4 mg, Cycle 2),   (original dose 266.4 mg, Cycle 3),   (original dose 266.4 mg, Cycle 4) Administration: 270 mg (01/07/2019), 270 mg (01/14/2019), 270 mg (01/21/2019), 270 mg (01/28/2019), 270 mg (02/04/2019), 270 mg (02/11/2019) PACLitaxel (TAXOL) 108 mg in sodium chloride 0.9 % 250 mL chemo infusion (</= 80mg /m2), 45 mg/m2 = 108 mg, Intravenous,  Once, 6 of 6 cycles Administration: 108 mg (01/07/2019), 108 mg (01/14/2019), 108 mg (01/21/2019), 108 mg (01/28/2019), 108 mg (02/04/2019), 108 mg (02/11/2019) fosaprepitant (EMEND) 150 mg, dexamethasone (DECADRON) 12 mg in sodium chloride  0.9 % 145 mL IVPB, , Intravenous,  Once, 5 of 5 cycles Administration:  (01/14/2019),  (01/21/2019),  (01/28/2019),  (02/04/2019),  (02/11/2019)  for chemotherapy treatment.    03/19/2019 -  Chemotherapy   The patient had durvalumab (IMFINZI) 1,240 mg in sodium chloride 0.9 % 100 mL chemo infusion, 9.9 mg/kg = 1,260 mg, Intravenous,  Once, 1 of 6 cycles Administration: 1,240 mg (03/19/2019)  for chemotherapy treatment.      History of Present Illness: The patient's records from the referring physician were obtained and reviewed and the patient interviewed to confirm this HPI.  [name] presents to clinic today to review recent finding of brain tumor.  The tumor was uncovered in September 2020 after undergoing staging brain MRI for lung cancer.  She denies any neurologic symptoms or any history of neurologic symptoms.  Repeat imaging was performed earlier this month.  She is currently undergoing chemotherapy for [facility] lung cancer with Dr. Delton Coombes at [facility].   Medications: Current Outpatient Medications on File Prior to Visit  Medication Sig Dispense Refill  . ALPRAZolam (XANAX) 0.25 MG tablet Take 0.25 mg by mouth as needed.     Marland Kitchen amLODipine (NORVASC) 5 MG tablet Take 5 mg by mouth every morning.     . Calcium Carbonate Antacid (TUMS PO) Take 4 tablets by mouth daily.     . Cyanocobalamin (VITAMIN B-12) 2500 MCG SUBL Place 2,500 mcg under the tongue every morning.     Hunt Oris IV Inject into the vein every 14 (fourteen) days.    Marland Kitchen gabapentin (NEURONTIN) 300 MG capsule Take 300 mg by mouth at bedtime  as needed for pain.    . Multiple Vitamin (MULITIVITAMIN WITH MINERALS) TABS Take 1 tablet by mouth every morning.     . naproxen (NAPROSYN) 500 MG tablet Take 500 mg by mouth as needed.     Marland Kitchen omeprazole (PRILOSEC) 20 MG capsule Take 20 mg by mouth daily.    . ferrous sulfate 325 (65 FE) MG tablet Take 1 tablet (325 mg total) by mouth 3 (three) times daily after meals. (Patient not  taking: Reported on 03/20/2019)    . ibuprofen (ADVIL,MOTRIN) 800 MG tablet Take 1 tablet (800 mg total) by mouth 3 (three) times daily. (Patient not taking: Reported on 03/20/2019) 90 tablet 2  . [DISCONTINUED] prochlorperazine (COMPAZINE) 10 MG tablet Take 1 tablet (10 mg total) by mouth every 6 (six) hours as needed (Nausea or vomiting). (Patient not taking: Reported on 03/05/2019) 30 tablet 1   No current facility-administered medications on file prior to visit.    Allergies: No Known Allergies Past Medical History:  Past Medical History:  Diagnosis Date  . Anemia   . Arthritis   . Diabetes mellitus    diet controlled  . GERD (gastroesophageal reflux disease)   . HTN (hypertension)   . HTN (hypertension)   . Port-A-Cath in place 01/06/2019  . Shortness of breath dyspnea    with exertion   Past Surgical History:  Past Surgical History:  Procedure Laterality Date  . CHOLECYSTECTOMY  1997  . COLONOSCOPY    . GASTRIC BYPASS    . INCISIONAL HERNIA REPAIR  04/11/11  . IR IMAGING GUIDED PORT INSERTION  12/27/2018  . LAPAROSCOPIC SALPINGOOPHERECTOMY    . LAPAROTOMY  04/11/2011   Procedure: EXPLORATORY LAPAROTOMY;  Surgeon: Joyice Faster. Cornett, MD;  Location: [facility];  Service: General;  Laterality: N/A;  closure port hole  . TOTAL HIP ARTHROPLASTY  03/07/2012   Procedure: TOTAL HIP ARTHROPLASTY ANTERIOR APPROACH;  Surgeon: Mauri Pole, MD;  Location: [facility];  Service: Orthopedics;  Laterality: Right;  . TOTAL SHOULDER ARTHROPLASTY Left 01/21/2015  . TOTAL SHOULDER ARTHROPLASTY Left 01/21/2015   Procedure: LEFT TOTAL SHOULDER ARTHROPLASTY;  Surgeon: Justice Britain, MD;  Location: [facility];  Service: Orthopedics;  Laterality: Left;  Marland Kitchen VAGINAL HYSTERECTOMY     Social History:  Social History   Socioeconomic History  . Marital status: Single    Spouse name: Not on file  . Number of children: Not on file  . Years of education: 12th grade  . Highest education level: Not on file   Occupational History  . Occupation: Employed    Employer: [employer]  Tobacco Use  . Smoking status: Former Smoker    Packs/day: 1.00    Years: 12.00    Pack years: 12.00    Types: Cigarettes    Quit date: 03/20/1976    Years since quitting: 43.0  . Smokeless tobacco: Never Used  Substance and Sexual Activity  . Alcohol use: No  . Drug use: No  . Sexual activity: Never  Other Topics Concern  . Not on file  Social History Narrative  . Not on file   Social Determinants of Health   Financial Resource Strain: Low Risk   . Difficulty of Paying Living Expenses: Not very hard  Food Insecurity: No Food Insecurity  . Worried About Charity fundraiser in the Last Year: Never true  . Ran Out of Food in the Last Year: Never true  Transportation Needs: No Transportation Needs  . Lack of Transportation (Medical):  No  . Lack of Transportation (Non-Medical): No  Physical Activity: Inactive  . Days of Exercise per Week: 0 days  . Minutes of Exercise per Session: 0 min  Stress: No Stress Concern Present  . Feeling of Stress : Only a little  Social Connections: Somewhat Isolated  . Frequency of Communication with Friends and Family: More than three times a week  . Frequency of Social Gatherings with Friends and Family: Once a week  . Attends Religious Services: More than 4 times per year  . Active Member of Clubs or Organizations: No  . Attends Archivist Meetings: Never  . Marital Status: Never married  Intimate Partner Violence: Not At Risk  . Fear of Current or Ex-Partner: No  . Emotionally Abused: No  . Physically Abused: No  . Sexually Abused: No   Family History:  Family History  Problem Relation Age of Onset  . Breast cancer Mother   . COPD Mother   . Arthritis Mother   . Diabetes Mother   . Hypertension Mother   . Hypertension Father   . Diabetes Father   . Breast cancer Sister   . Thyroid cancer Brother   . Huntington's disease Maternal  Grandmother   . Heart attack Brother     Review of Systems: Constitutional: Denies fevers, chills or abnormal weight loss Eyes: Denies blurriness of vision Ears, nose, mouth, throat, and face: Denies mucositis or sore throat Respiratory: Denies cough, dyspnea or wheezes Cardiovascular: Denies palpitation, chest discomfort or lower extremity swelling Gastrointestinal:  Denies nausea, constipation, diarrhea GU: Denies dysuria or incontinence Skin: Denies abnormal skin rashes Neurological: Per HPI Musculoskeletal: Denies joint pain, back or neck discomfort. No decrease in ROM Behavioral/Psych: Denies anxiety, disturbance in thought content, and mood instability  Physical Exam: Vitals:   03/20/19 0957  BP: (!) 152/71  Pulse: 96  Resp: 18  Temp: 98 F (36.7 C)  SpO2: 94%   KPS: 90. General: Alert, cooperative, pleasant, in no acute distress Head: Normal EENT: No conjunctival injection or scleral icterus. Oral mucosa moist Lungs: Resp effort normal Cardiac: Regular rate and rhythm Abdomen: Soft, non-distended abdomen Skin: No rashes cyanosis or petechiae. Extremities: No clubbing or edema  Neurologic Exam: Mental Status: Awake, alert, attentive to examiner. Oriented to self and environment. Language is fluent with intact comprehension.  Cranial Nerves: Visual acuity is grossly normal. Visual fields are full. Extra-ocular movements intact. No ptosis. Face is symmetric, tongue midline. Motor: Tone and bulk are normal. Power is full in both arms and legs. Reflexes are symmetric, no pathologic reflexes present. Intact finger to nose bilaterally Sensory: Intact to light touch and temperature Gait: Normal and tandem gait is deferred.   Labs: I have reviewed the data as listed    Component Value Date/Time   NA 142 03/19/2019 0935   K 3.8 03/19/2019 0935   CL 106 03/19/2019 0935   CO2 24 03/19/2019 0935   GLUCOSE 113 (H) 03/19/2019 0935   BUN 10 03/19/2019 0935   CREATININE  0.68 03/19/2019 0935   CALCIUM 8.8 (L) 03/19/2019 0935   PROT 6.6 03/19/2019 0935   ALBUMIN 3.4 (L) 03/19/2019 0935   AST 16 03/19/2019 0935   ALT 18 03/19/2019 0935   ALKPHOS 93 03/19/2019 0935   BILITOT 0.4 03/19/2019 0935   GFRNONAA >60 03/19/2019 0935   GFRAA >60 03/19/2019 0935   Lab Results  Component Value Date   WBC 3.9 (L) 03/19/2019   NEUTROABS 2.7 03/19/2019   HGB 10.8 (  L) 03/19/2019   HCT 36.0 03/19/2019   MCV 88.2 03/19/2019   PLT 372 03/19/2019    Imaging: [facility] Clinician Interpretation: I have personally reviewed the CNS images as listed.  My interpretation, in the context of the patient's clinical presentation, is stable disease  CT Chest W Contrast  Result Date: 03/03/2019 CLINICAL DATA:  Lung cancer. Chemo radiation started 01/07/2019 and finished 02/11/2019. Immunotherapy treatment planned. EXAM: CT CHEST WITH CONTRAST TECHNIQUE: Multidetector CT imaging of the chest was performed during intravenous contrast administration. CONTRAST:  61mL OMNIPAQUE IOHEXOL 300 MG/ML  SOLN COMPARISON:  PET 12/17/2018 and CT chest 10/17/2018. FINDINGS: Cardiovascular: Right IJ Port-A-Cath terminates in the low SVC. Atherosclerotic calcification of the aorta and aortic valve. Pulmonic trunk is enlarged. Heart size within normal limits. No pericardial effusion. Mediastinum/Nodes: 7 mm AP window lymph node, minimally decreased from 8 mm on 10/17/2018. Otherwise, no pathologically enlarged mediastinal, hilar or axillary lymph nodes. Esophagus is grossly unremarkable. Lungs/Pleura: Mild scarring in the medial right lower lobe. Spiculated left upper lobe mass has decreased in size, now measuring 3.3 x 4.0 cm, compared to 4.9 x 6.6 cm on 10/17/2018. Surrounding patchy ground-glass in the left upper and left lower lobes, most indicative of radiation therapy. No pleural fluid. Airway is unremarkable. Upper Abdomen: Well-circumscribed low-attenuation lesion in the left hepatic lobe measures 1.9 cm,  stable and likely a cyst. Marked common bile duct dilatation (1.5 cm), likely related to cholecystectomy, similar to the prior exam. Visualized portions of the adrenal glands, kidneys, spleen and pancreas are unremarkable. Postoperative changes in the stomach. No upper abdominal adenopathy. Musculoskeletal: Degenerative changes in the spine and right shoulder. Left shoulder arthroplasty. IMPRESSION: 1. Reduction in size of the left upper lobe mass with evolving changes of radiation therapy in the left hemithorax. No pathologically enlarged mediastinal or hilar lymph nodes. 2.  Aortic atherosclerosis (ICD10-170.0). 3. Enlarged pulmonic trunk, indicative of pulmonary arterial hypertension. Electronically Signed   By: Lorin Picket M.D.   On: 03/03/2019 10:30    Assessment/Plan Meningioma ([place]) [D32.9]  We appreciate the opportunity to participate in the care of [name].  Repeat CNS imaging essentially confirms left temporal mass to be meningioma rather than atypical metastasis.  There is slight increase in T2 signal/edema on December scan but no change in size of enhancing mass.     Given lack of symptoms and relative lack of dimensional growth, at this time we recommend continued imaging surveillance.  We recommend she return to clinic in 4 months following MRI brain for evaluation.  Her case will also be reviewed and discussed in brain/spine tumor board.  Screening for potential clinical trials was performed and discussed using eligibility criteria for active protocols at [facility], loco-regional tertiary centers, as well as national database available on [email or web address].    The patient is not a candidate for a research protocol at this time due to stable disease.   We spent twenty additional minutes teaching regarding the natural history, biology, and historical experience in the treatment of brain tumors. We then discussed in detail the current recommendations for therapy focusing on the  mode of administration, mechanism of action, anticipated toxicities, and quality of life issues associated with this plan.   All questions were answered. The patient knows to call the clinic with any problems, questions or concerns. No barriers to learning were detected.  The total time spent in the encounter was 45 minutes and more than 50% was on counseling and review of test results  Ventura Sellers, MD Medical Director of Neuro-Oncology [facility] at [place] 03/20/19 1:15 PM

## 2019-03-20 NOTE — Progress Notes (Signed)
Radiation Oncology         [phone] ________________________________  Name: Denise [name] MRN: [id number]  Date: 03/20/2019  DOB: October 17, 1950  Follow-Up Visit Note  CC: Iona Beard, MD  Iona Beard, MD    ICD-10-CM   1. Adenocarcinoma of left lung (HCC)  C34.92     Diagnosis:   Clinical stage IIIb (T3N2) adenocarcinoma the left lung  Interval Since Last Radiation:  5 weeks  01/02/2019 through 02/11/2019 Site Technique Total Dose (Gy) Dose per Fx (Gy) Completed Fx Beam Energies  Thorax: Lung_Lt 3D 60/60 2 30/30 6X, 10X, 15X    Narrative:  The patient returns today for routine follow-up.     On review of systems, she reports a productive cough in the morning with clear phlegm that resolves on its own, as well as shortness of breath with exertion. She denies any cough otherwise, pain, hemoptysis, and difficulty swallowing.  ALLERGIES:  has No Known Allergies.  Meds: Current Outpatient Medications  Medication Sig Dispense Refill  . ALPRAZolam (XANAX) 0.25 MG tablet Take 0.25 mg by mouth as needed.     Marland Kitchen amLODipine (NORVASC) 5 MG tablet Take 5 mg by mouth every morning.     . Calcium Carbonate Antacid (TUMS PO) Take 4 tablets by mouth daily.     . Cyanocobalamin (VITAMIN B-12) 2500 MCG SUBL Place 2,500 mcg under the tongue every morning.     Hunt Oris IV Inject into the vein every 14 (fourteen) days.    . ferrous sulfate 325 (65 FE) MG tablet Take 1 tablet (325 mg total) by mouth 3 (three) times daily after meals. (Patient not taking: Reported on 03/20/2019)    . gabapentin (NEURONTIN) 300 MG capsule Take 300 mg by mouth at bedtime as needed for pain.    . Multiple Vitamin (MULITIVITAMIN WITH MINERALS) TABS Take 1 tablet by mouth every morning.     . naproxen (NAPROSYN) 500 MG tablet Take 500 mg by mouth as needed.     Marland Kitchen omeprazole (PRILOSEC) 20 MG capsule Take 20 mg by mouth daily.    Marland Kitchen ibuprofen (ADVIL,MOTRIN) 800 MG tablet Take 1 tablet (800 mg total) by mouth 3 (three)  times daily. (Patient not taking: Reported on 03/20/2019) 90 tablet 2   No current facility-administered medications for this encounter.    Physical Findings: The patient is in no acute distress. Patient is alert and oriented.  height is 5' 2.5" (1.588 m) and weight is 283 lb 4 oz (128.5 kg). Her temporal temperature is 98 F (36.7 C). Her blood pressure is 131/80 and her pulse is 91. Her respiration is 18 and oxygen saturation is 98%. .  No significant changes. Lungs are clear to auscultation bilaterally. Heart has regular rate and rhythm. No palpable cervical, supraclavicular, or axillary adenopathy. Abdomen soft, non-tender, normal bowel sounds.  Lab Findings: Lab Results  Component Value Date   WBC 3.9 (L) 03/19/2019   HGB 10.8 (L) 03/19/2019   HCT 36.0 03/19/2019   MCV 88.2 03/19/2019   PLT 372 03/19/2019    Radiographic Findings: CT Chest W Contrast  Result Date: 03/03/2019 CLINICAL DATA:  Lung cancer. Chemo radiation started 01/07/2019 and finished 02/11/2019. Immunotherapy treatment planned. EXAM: CT CHEST WITH CONTRAST TECHNIQUE: Multidetector CT imaging of the chest was performed during intravenous contrast administration. CONTRAST:  8mL OMNIPAQUE IOHEXOL 300 MG/ML  SOLN COMPARISON:  PET 12/17/2018 and CT chest 10/17/2018. FINDINGS: Cardiovascular: Right IJ Port-A-Cath terminates in the low SVC. Atherosclerotic calcification of the aorta and  aortic valve. Pulmonic trunk is enlarged. Heart size within normal limits. No pericardial effusion. Mediastinum/Nodes: 7 mm AP window lymph node, minimally decreased from 8 mm on 10/17/2018. Otherwise, no pathologically enlarged mediastinal, hilar or axillary lymph nodes. Esophagus is grossly unremarkable. Lungs/Pleura: Mild scarring in the medial right lower lobe. Spiculated left upper lobe mass has decreased in size, now measuring 3.3 x 4.0 cm, compared to 4.9 x 6.6 cm on 10/17/2018. Surrounding patchy ground-glass in the left upper and left  lower lobes, most indicative of radiation therapy. No pleural fluid. Airway is unremarkable. Upper Abdomen: Well-circumscribed low-attenuation lesion in the left hepatic lobe measures 1.9 cm, stable and likely a cyst. Marked common bile duct dilatation (1.5 cm), likely related to cholecystectomy, similar to the prior exam. Visualized portions of the adrenal glands, kidneys, spleen and pancreas are unremarkable. Postoperative changes in the stomach. No upper abdominal adenopathy. Musculoskeletal: Degenerative changes in the spine and right shoulder. Left shoulder arthroplasty. IMPRESSION: 1. Reduction in size of the left upper lobe mass with evolving changes of radiation therapy in the left hemithorax. No pathologically enlarged mediastinal or hilar lymph nodes. 2.  Aortic atherosclerosis (ICD10-170.0). 3. Enlarged pulmonic trunk, indicative of pulmonary arterial hypertension. Electronically Signed   By: Lorin Picket M.D.   On: 03/03/2019 10:30   MR Brain W Wo Contrast  Result Date: 02/18/2019 CLINICAL DATA:  Follow-up left sphenoid wing meningioma. EXAM: MRI HEAD WITHOUT AND WITH CONTRAST TECHNIQUE: Multiplanar, multiecho pulse sequences of the brain and surrounding structures were obtained without and with intravenous contrast. CONTRAST:  65mL MULTIHANCE GADOBENATE DIMEGLUMINE 529 MG/ML IV SOLN COMPARISON:  12/16/2018 FINDINGS: Brain: The examination redemonstrates a meningioma the greater wing of the sphenoid on the left measuring 2.5 x 2.0 x 2.5 cm. Compared to the same measurements on the study of September there is no change. Mild mass-effect upon the temporal tip on the left. Vasogenic edema within the left temporal lobe is slightly more pronounced. Elsewhere the brain has normal appearance without evidence of old small or large vessel infarction. No sign metastatic disease. No hydrocephalus. No extra-axial collection. Chronic arachnoid herniation into the sella as seen previously. Vascular: Major  vessels at the base of the brain show flow. Skull and upper cervical spine: Negative Sinuses/Orbits: Clear/normal Other: None IMPRESSION: Similar appearance of a meningioma of the greater wing of the sphenoid on the left measuring 2.5 x 2.0 x 2.5 cm. Mass-effect upon the left temporal tip. Vasogenic edema in the left temporal lobe, probably slightly increased since the previous study. No sign that this represents a metastatic lesion. No other brain abnormality. Electronically Signed   By: Nelson Chimes M.D.   On: 02/18/2019 13:44    Impression:  The patient is recovering from the effects of radiation.  Recent chest CT scan shows significant tumor shrinkage without any new problems.  Plan: As needed follow-up in radiation oncology.  Patient started immunotherapy earlier this week and is tolerating well thus far.  She will continue close follow-up with Dr. Delton Coombes.  ____________________________________ Gery Pray, MD   This document serves as a record of services personally performed by Gery Pray, MD. It was created on his behalf by Wilburn Mylar, a trained medical scribe. The creation of this record is based on the scribe's personal observations and the provider's statements to them. This document has been checked and approved by the attending provider.

## 2019-03-20 NOTE — Patient Instructions (Signed)
Coronavirus (COVID-19) Are you at risk?  Are you at risk for the Coronavirus (COVID-19)?  To be considered HIGH RISK for Coronavirus (COVID-19), you have to meet the following criteria:  . Traveled to [place], [place], [place], [place] or [place]; or in the [place] to [place], [place], [place], or [place]; and have fever, cough, and shortness of breath within the last 2 weeks of travel OR . Been in close contact with a person diagnosed with COVID-19 within the last 2 weeks and have fever, cough, and shortness of breath . IF YOU DO NOT MEET THESE CRITERIA, YOU ARE CONSIDERED LOW RISK FOR COVID-19.  What to do if you are HIGH RISK for COVID-19?  . If you are having a medical emergency, call 911. . Seek medical care right away. Before you go to a doctor's office, urgent care or emergency department, call ahead and tell them about your recent travel, contact with someone diagnosed with COVID-19, and your symptoms. You should receive instructions from your physician's office regarding next steps of care.  . When you arrive at healthcare provider, tell the healthcare staff immediately you have returned from visiting [place], [place], [place], [place] or [place]; or traveled in the [place] to [place], [place], [place], or [place]; in the last two weeks or you have been in close contact with a person diagnosed with COVID-19 in the last 2 weeks.   . Tell the health care staff about your symptoms: fever, cough and shortness of breath. . After you have been seen by a medical provider, you will be either: o Tested for (COVID-19) and discharged home on quarantine except to seek medical care if symptoms worsen, and asked to  - Stay home and avoid contact with others until you get your results (4-5 days)  - Avoid travel on public transportation if possible (such as bus, train, or airplane) or o Sent to the Emergency Department by EMS for evaluation, COVID-19 testing, and possible  admission depending on your condition and test results.  What to do if you are LOW RISK for COVID-19?  Reduce your risk of any infection by using the same precautions used for avoiding the common cold or flu:  . Wash your hands often with soap and warm water for at least 20 seconds.  If soap and water are not readily available, use an alcohol-based hand sanitizer with at least 60% alcohol.  . If coughing or sneezing, cover your mouth and nose by coughing or sneezing into the elbow areas of your shirt or coat, into a tissue or into your sleeve (not your hands). . Avoid shaking hands with others and consider head nods or verbal greetings only. . Avoid touching your eyes, nose, or mouth with unwashed hands.  . Avoid close contact with people who are sick. . Avoid places or events with large numbers of people in one location, like concerts or sporting events. . Carefully consider travel plans you have or are making. . If you are planning any travel outside or inside the [place], visit the CDC's Travelers' Health webpage for the latest health notices. . If you have some symptoms but not all symptoms, continue to monitor at home and seek medical attention if your symptoms worsen. . If you are having a medical emergency, call 911.   ADDITIONAL HEALTHCARE OPTIONS FOR PATIENTS  [facility] / e-Visit: [email or web address]         [facility]: [phone]  [place]   [facility]: [phone]                   [facility]: [phone]   

## 2019-03-24 ENCOUNTER — Telehealth: Payer: Self-pay | Admitting: Internal Medicine

## 2019-03-24 NOTE — Telephone Encounter (Signed)
Scheduled appt per 12/31 los.  Sent a message to HIM pool to get a calendar mailed out.

## 2019-03-25 ENCOUNTER — Encounter (HOSPITAL_COMMUNITY): Payer: Self-pay | Admitting: General Practice

## 2019-03-25 ENCOUNTER — Inpatient Hospital Stay (HOSPITAL_COMMUNITY): Payer: Medicare Other | Attending: Hematology | Admitting: General Practice

## 2019-03-25 DIAGNOSIS — Z5112 Encounter for antineoplastic immunotherapy: Secondary | ICD-10-CM | POA: Insufficient documentation

## 2019-03-25 DIAGNOSIS — R112 Nausea with vomiting, unspecified: Secondary | ICD-10-CM | POA: Insufficient documentation

## 2019-03-25 DIAGNOSIS — Z79899 Other long term (current) drug therapy: Secondary | ICD-10-CM | POA: Insufficient documentation

## 2019-03-25 DIAGNOSIS — C3412 Malignant neoplasm of upper lobe, left bronchus or lung: Secondary | ICD-10-CM | POA: Insufficient documentation

## 2019-03-25 DIAGNOSIS — D329 Benign neoplasm of meninges, unspecified: Secondary | ICD-10-CM | POA: Insufficient documentation

## 2019-03-25 NOTE — Progress Notes (Signed)
[facility] CSW Progress Notes  Call to patient to check in and assess for needs/resources.  Initial assessment completed in Oct 2020.  Was referred to [place] as she expressed concern about affording gas for appointments in [place] and [place].  Will follow up w [place] - patient has not received anything from them.  Will also ask [place] to reach out to see if they can assist in any way.  Patient working today, did not want to talk long.  Will either resubmit [facility] application or advise patient on status of her current request.   Edwyna Shell, [place] Worker Phone:  [phone] Cell:  [phone]

## 2019-03-27 DIAGNOSIS — Z23 Encounter for immunization: Secondary | ICD-10-CM | POA: Diagnosis not present

## 2019-04-01 ENCOUNTER — Other Ambulatory Visit (HOSPITAL_COMMUNITY): Payer: Self-pay | Admitting: Hematology

## 2019-04-01 ENCOUNTER — Other Ambulatory Visit: Payer: Self-pay

## 2019-04-02 ENCOUNTER — Ambulatory Visit (HOSPITAL_COMMUNITY): Payer: Medicare Other | Admitting: Hematology

## 2019-04-02 ENCOUNTER — Encounter (HOSPITAL_COMMUNITY): Payer: Self-pay | Admitting: Hematology

## 2019-04-02 ENCOUNTER — Encounter (HOSPITAL_COMMUNITY): Payer: Self-pay

## 2019-04-02 ENCOUNTER — Inpatient Hospital Stay (HOSPITAL_COMMUNITY): Payer: Medicare Other | Attending: Hematology | Admitting: Hematology

## 2019-04-02 ENCOUNTER — Inpatient Hospital Stay (HOSPITAL_COMMUNITY): Payer: Medicare Other

## 2019-04-02 ENCOUNTER — Other Ambulatory Visit (HOSPITAL_COMMUNITY): Payer: Medicare Other

## 2019-04-02 ENCOUNTER — Ambulatory Visit (HOSPITAL_COMMUNITY): Payer: Medicare Other

## 2019-04-02 VITALS — BP 142/73 | HR 100 | Temp 97.7°F | Resp 20 | Wt 282.0 lb

## 2019-04-02 VITALS — BP 144/70 | HR 81 | Temp 97.1°F | Resp 18 | Wt 282.0 lb

## 2019-04-02 DIAGNOSIS — Z95828 Presence of other vascular implants and grafts: Secondary | ICD-10-CM

## 2019-04-02 DIAGNOSIS — Z79899 Other long term (current) drug therapy: Secondary | ICD-10-CM | POA: Diagnosis not present

## 2019-04-02 DIAGNOSIS — C3412 Malignant neoplasm of upper lobe, left bronchus or lung: Secondary | ICD-10-CM | POA: Diagnosis not present

## 2019-04-02 DIAGNOSIS — C3492 Malignant neoplasm of unspecified part of left bronchus or lung: Secondary | ICD-10-CM

## 2019-04-02 DIAGNOSIS — R112 Nausea with vomiting, unspecified: Secondary | ICD-10-CM | POA: Diagnosis not present

## 2019-04-02 DIAGNOSIS — D329 Benign neoplasm of meninges, unspecified: Secondary | ICD-10-CM | POA: Insufficient documentation

## 2019-04-02 DIAGNOSIS — Z5112 Encounter for antineoplastic immunotherapy: Secondary | ICD-10-CM | POA: Diagnosis not present

## 2019-04-02 LAB — COMPREHENSIVE METABOLIC PANEL
ALT: 15 U/L (ref 0–44)
AST: 18 U/L (ref 15–41)
Albumin: 3.3 g/dL — ABNORMAL LOW (ref 3.5–5.0)
Alkaline Phosphatase: 94 U/L (ref 38–126)
Anion gap: 9 (ref 5–15)
BUN: 10 mg/dL (ref 8–23)
CO2: 25 mmol/L (ref 22–32)
Calcium: 8.9 mg/dL (ref 8.9–10.3)
Chloride: 107 mmol/L (ref 98–111)
Creatinine, Ser: 0.72 mg/dL (ref 0.44–1.00)
GFR calc Af Amer: >60 mL/min (ref >60)
GFR calc non Af Amer: >60 mL/min (ref >60)
Glucose, Bld: 174 mg/dL — ABNORMAL HIGH (ref 70–99)
Potassium: 3.6 mmol/L (ref 3.5–5.1)
Sodium: 141 mmol/L (ref 135–145)
Total Bilirubin: 0.1 mg/dL — ABNORMAL LOW (ref 0.3–1.2)
Total Protein: 6.5 g/dL (ref 6.5–8.1)

## 2019-04-02 LAB — CBC WITH DIFFERENTIAL/PLATELET
Abs Immature Granulocytes: 0.02 10*3/uL (ref 0.00–0.07)
Basophils Absolute: 0 10*3/uL (ref 0.0–0.1)
Basophils Relative: 1 %
Eosinophils Absolute: 0.1 10*3/uL (ref 0.0–0.5)
Eosinophils Relative: 3 %
HCT: 35.6 % — ABNORMAL LOW (ref 36.0–46.0)
Hemoglobin: 11 g/dL — ABNORMAL LOW (ref 12.0–15.0)
Immature Granulocytes: 0 %
Lymphocytes Relative: 12 %
Lymphs Abs: 0.6 10*3/uL — ABNORMAL LOW (ref 0.7–4.0)
MCH: 27.3 pg (ref 26.0–34.0)
MCHC: 30.9 g/dL (ref 30.0–36.0)
MCV: 88.3 fL (ref 80.0–100.0)
Monocytes Absolute: 0.3 10*3/uL (ref 0.1–1.0)
Monocytes Relative: 6 %
Neutro Abs: 4 10*3/uL (ref 1.7–7.7)
Neutrophils Relative %: 78 %
Platelets: 330 10*3/uL (ref 150–400)
RBC: 4.03 MIL/uL (ref 3.87–5.11)
RDW: 18.8 % — ABNORMAL HIGH (ref 11.5–15.5)
WBC: 5.1 10*3/uL (ref 4.0–10.5)
nRBC: 0 % (ref 0.0–0.2)

## 2019-04-02 LAB — TSH: TSH: 1.376 u[IU]/mL (ref 0.350–4.500)

## 2019-04-02 MED ORDER — HEPARIN SOD (PORK) LOCK FLUSH 100 UNIT/ML IV SOLN
500.0000 [IU] | Freq: Once | INTRAVENOUS | Status: AC | PRN
  Administered 2019-04-02: 500 [IU]

## 2019-04-02 MED ORDER — SODIUM CHLORIDE 0.9% FLUSH: 10.0000 mL | Freq: Once | INTRAVENOUS | Status: DC

## 2019-04-02 MED ORDER — SODIUM CHLORIDE 0.9% FLUSH
10.0000 mL | INTRAVENOUS | Status: DC | PRN
  Administered 2019-04-02: 10 mL

## 2019-04-02 MED ORDER — SODIUM CHLORIDE 0.9 % IV SOLN: Freq: Once | INTRAVENOUS | Status: AC

## 2019-04-02 MED ORDER — HEPARIN SOD (PORK) LOCK FLUSH 100 UNIT/ML IV SOLN: 500.0000 [IU] | Freq: Once | INTRAVENOUS | Status: DC

## 2019-04-02 MED ORDER — SODIUM CHLORIDE 0.9 % IV SOLN
9.9000 mg/kg | Freq: Once | INTRAVENOUS | Status: AC
  Administered 2019-04-02: 1240 mg via INTRAVENOUS
  Filled 2019-04-02: qty 20

## 2019-04-02 NOTE — Patient Instructions (Addendum)
[place] at [facility] Discharge Instructions  You were seen today by Dr. Delton Coombes. He went over your recent lab results. Try using an oily moisturizer to help with the skin dryness. Try taking maalox, mylanta, gasX or simethicone to help with bloating/gas feeling. He will see you back in 2 weeks for labs, treatment and follow up.   Thank you for choosing [place] at [facility] to provide your oncology and hematology care.  To afford each patient quality time with our provider, please arrive at least 15 minutes before your scheduled appointment time.   If you have a lab appointment with the [facility] please come in thru the  Main Entrance and check in at the main information desk  You need to re-schedule your appointment should you arrive 10 or more minutes late.  We strive to give you quality time with our providers, and arriving late affects you and other patients whose appointments are after yours.  Also, if you no show three or more times for appointments you may be dismissed from the clinic at the providers discretion.     Again, thank you for choosing [facility].  Our hope is that these requests will decrease the amount of time that you wait before being seen by our physicians.       _____________________________________________________________  Should you have questions after your visit to [facility], please contact our office at [phone] between the hours of 8:00 a.m. and 4:30 p.m.  Voicemails left after 4:00 p.m. will not be returned until the following business day.  For prescription refill requests, have your pharmacy contact our office and allow 72 hours.    Cancer Center Support Programs:   > Cancer Support Group  2nd Tuesday of the month 1pm-2pm, Journey Room

## 2019-04-02 NOTE — Assessment & Plan Note (Signed)
1.  Clinical stage IIIb (T3N2) adenocarcinoma the left lung: -Presentation with hemoptysis, CT chest on 10/17/2018 showed left upper lobe lung mass, and enlarged AP window lymph node. -CT-guided biopsy on 11/19/2018 consistent with adenocarcinoma.  She smoked 1 pack/day for 15 years, quit 25 years ago. -PET scan on 12/17/2018 shows 7.3 cm left upper lobe lung mass, 1 cm short axis AP window lymph node with SUV 3.4.  Focus of increased metabolic activity in the cecum without definite CT correlate.  She reportedly had colonoscopy 3 to 4 years ago which was normal. -Chemoradiation therapy with carboplatin and paclitaxel weekly from 01/07/2019 through 02/11/2019.   -We reviewed results and images of the CT of the chest from 03/03/2019 which showed reduction in size of the left upper lobe lung mass with evolving changes of radiation therapy in the left hemithorax.  No pathologically enlarged mediastinal or hilar lymph nodes.  The left upper lobe mass currently measures 3.3 x 4 cm.  It was compared to 4.9 x 6.6 cm. -Consolidation immunotherapy with Imfinzi on 03/19/2019. -For the last 1 week she has noticed discomfort medial to the left shoulder blade.  As she moved her shoulder to relieve it, she developed slight pain.  She reported that burping improves.  She has noticed increased gas in her abdomen and is having more flatulence.  She has not changed any of her diet. -I have recommended using Gas-X and Maalox as needed.  She denied any diarrhea.  No nausea or vomiting reported. -I have reviewed her blood work which is grossly within normal limits.  She may proceed with cycle 2 today.  2.  Meningioma: -MRI of the brain on 12/16/2018 showed 1.8 x 2.6 x 2.6 cm enhancing extra-axial mass along the left greater wing of sphenoid.  Mass has imaging appearance of meningioma.  However dural based metastatic lesion cannot be excluded. -She does not have any left-sided facial pain. -MRI of the brain on 02/18/2019 showed  similar appearance of meningioma of the greater wing of the sphenoid measuring 2.5 x 2 x 2.5 cm.  3.  Nausea/vomiting: -She did report some nausea but denied any vomiting.  We will continue Compazine as needed.

## 2019-04-02 NOTE — Progress Notes (Signed)
Patient assessed and labs reviewed by Dr. Delton Coombes. Pt is good for Tx.

## 2019-04-02 NOTE — Progress Notes (Signed)
Patient presents today for treatment and follow up visit with Dr. Delton Coombes. Labs within parameters for treatment and reviewed by MD. Vital signs within parameters for treatment. MAR reviewed.   Treatment given today per MD orders. Tolerated infusion without adverse affects. Vital signs stable. No complaints at this time. Discharged from clinic ambulatory. F/U with [facility] as scheduled.

## 2019-04-02 NOTE — Progress Notes (Signed)
[place], [place] [address]   CLINIC:  Medical Oncology/Hematology  PCP:  Iona Beard, MD [address] [place] [id number] [phone]   REASON FOR VISIT:  Stage III left lung adenocarcinoma.  CURRENT THERAPY: Consolidation with durvalumab.    INTERVAL HISTORY:  Ms. Barna 69 y.o. female seen for follow-up of stage III lung cancer.  She started durvalumab consolidation on 03/19/2019.  Did not have diarrhea.  Did not have skin rashes.  However she reported dry skin.  Energy levels are 50%.  Appetite is 100%.  Reported pain medial to the left shoulder blade, relieved by burping.  Shortness of breath on exertion is stable.  REVIEW OF SYSTEMS:  Review of Systems  Respiratory: Positive for shortness of breath.   All other systems reviewed and are negative.    PAST MEDICAL/SURGICAL HISTORY:  Past Medical History:  Diagnosis Date  . Anemia   . Arthritis   . Diabetes mellitus    diet controlled  . GERD (gastroesophageal reflux disease)   . HTN (hypertension)   . HTN (hypertension)   . Port-A-Cath in place 01/06/2019  . Shortness of breath dyspnea    with exertion   Past Surgical History:  Procedure Laterality Date  . CHOLECYSTECTOMY  1997  . COLONOSCOPY    . GASTRIC BYPASS    . INCISIONAL HERNIA REPAIR  04/11/11  . IR IMAGING GUIDED PORT INSERTION  12/27/2018  . LAPAROSCOPIC SALPINGOOPHERECTOMY    . LAPAROTOMY  04/11/2011   Procedure: EXPLORATORY LAPAROTOMY;  Surgeon: Joyice Faster. Cornett, MD;  Location: [facility];  Service: General;  Laterality: N/A;  closure port hole  . TOTAL HIP ARTHROPLASTY  03/07/2012   Procedure: TOTAL HIP ARTHROPLASTY ANTERIOR APPROACH;  Surgeon: Mauri Pole, MD;  Location: [facility];  Service: Orthopedics;  Laterality: Right;  . TOTAL SHOULDER ARTHROPLASTY Left 01/21/2015  . TOTAL SHOULDER ARTHROPLASTY Left 01/21/2015   Procedure: LEFT TOTAL SHOULDER ARTHROPLASTY;  Surgeon: Justice Britain, MD;  Location:  [facility];  Service: Orthopedics;  Laterality: Left;  Marland Kitchen VAGINAL HYSTERECTOMY       SOCIAL HISTORY:  Social History   Socioeconomic History  . Marital status: Single    Spouse name: Not on file  . Number of children: Not on file  . Years of education: 12th grade  . Highest education level: Not on file  Occupational History  . Occupation: Employed    Employer: [employer]  Tobacco Use  . Smoking status: Former Smoker    Packs/day: 1.00    Years: 12.00    Pack years: 12.00    Types: Cigarettes    Quit date: 03/20/1976    Years since quitting: 43.0  . Smokeless tobacco: Never Used  Substance and Sexual Activity  . Alcohol use: No  . Drug use: No  . Sexual activity: Never  Other Topics Concern  . Not on file  Social History Narrative  . Not on file   Social Determinants of Health   Financial Resource Strain: Low Risk   . Difficulty of Paying Living Expenses: Not very hard  Food Insecurity: No Food Insecurity  . Worried About Charity fundraiser in the Last Year: Never true  . Ran Out of Food in the Last Year: Never true  Transportation Needs: No Transportation Needs  . Lack of Transportation (Medical): No  . Lack of Transportation (Non-Medical): No  Physical Activity: Inactive  . Days of Exercise per Week: 0  days  . Minutes of Exercise per Session: 0 min  Stress: No Stress Concern Present  . Feeling of Stress : Only a little  Social Connections: Somewhat Isolated  . Frequency of Communication with Friends and Family: More than three times a week  . Frequency of Social Gatherings with Friends and Family: Once a week  . Attends Religious Services: More than 4 times per year  . Active Member of Clubs or Organizations: No  . Attends Archivist Meetings: Never  . Marital Status: Never married  Intimate Partner Violence: Not At Risk  . Fear of Current or Ex-Partner: No  . Emotionally Abused: No  . Physically Abused: No  . Sexually Abused: No     FAMILY HISTORY:  Family History  Problem Relation Age of Onset  . Breast cancer Mother   . COPD Mother   . Arthritis Mother   . Diabetes Mother   . Hypertension Mother   . Hypertension Father   . Diabetes Father   . Breast cancer Sister   . Thyroid cancer Brother   . Huntington's disease Maternal Grandmother   . Heart attack Brother     CURRENT MEDICATIONS:  Outpatient Encounter Medications as of 04/02/2019  Medication Sig  . amLODipine (NORVASC) 5 MG tablet Take 5 mg by mouth every morning.   . Calcium Carbonate Antacid (TUMS PO) Take 4 tablets by mouth daily.   . Cyanocobalamin (VITAMIN B-12) 2500 MCG SUBL Place 2,500 mcg under the tongue every morning.   Hunt Oris IV Inject into the vein every 14 (fourteen) days.  . ferrous sulfate 325 (65 FE) MG tablet Take 1 tablet (325 mg total) by mouth 3 (three) times daily after meals.  . Multiple Vitamin (MULITIVITAMIN WITH MINERALS) TABS Take 1 tablet by mouth every morning.   Marland Kitchen omeprazole (PRILOSEC) 20 MG capsule Take 20 mg by mouth daily.  Marland Kitchen ALPRAZolam (XANAX) 0.25 MG tablet Take 0.25 mg by mouth as needed.   . gabapentin (NEURONTIN) 300 MG capsule Take 300 mg by mouth at bedtime as needed for pain.  Marland Kitchen ibuprofen (ADVIL,MOTRIN) 800 MG tablet Take 1 tablet (800 mg total) by mouth 3 (three) times daily. (Patient not taking: Reported on 04/02/2019)  . naproxen (NAPROSYN) 500 MG tablet Take 500 mg by mouth as needed.   . [DISCONTINUED] prochlorperazine (COMPAZINE) 10 MG tablet Take 1 tablet (10 mg total) by mouth every 6 (six) hours as needed (Nausea or vomiting). (Patient not taking: Reported on 03/05/2019)   Facility-Administered Encounter Medications as of 04/02/2019  Medication  . [DISCONTINUED] heparin lock flush 100 unit/mL  . [DISCONTINUED] sodium chloride flush (NS) 0.9 % injection 10 mL    ALLERGIES:  No Known Allergies   PHYSICAL EXAM:  ECOG Performance status: 0  Vitals:   04/02/19 1107  BP: (!) 142/73  Pulse:  100  Resp: 20  Temp: 97.7 F (36.5 C)  SpO2: 100%   Filed Weights   04/02/19 1107  Weight: 282 lb (127.9 kg)    Physical Exam Vitals reviewed.  Constitutional:      Appearance: Normal appearance.  Cardiovascular:     Rate and Rhythm: Normal rate and regular rhythm.     Heart sounds: Normal heart sounds.  Pulmonary:     Effort: Pulmonary effort is normal.     Breath sounds: Normal breath sounds.  Abdominal:     General: There is no distension.     Palpations: Abdomen is soft. There is no mass.  Musculoskeletal:  General: Swelling present.  Lymphadenopathy:     Cervical: No cervical adenopathy.  Skin:    General: Skin is warm.  Neurological:     General: No focal deficit present.     Mental Status: She is alert and oriented to person, place, and time.  Psychiatric:        Mood and Affect: Mood normal.        Behavior: Behavior normal.      LABORATORY DATA:  I have reviewed the labs as listed.  CBC    Component Value Date/Time   WBC 5.1 04/02/2019 1039   RBC 4.03 04/02/2019 1039   HGB 11.0 (L) 04/02/2019 1039   HCT 35.6 (L) 04/02/2019 1039   PLT 330 04/02/2019 1039   MCV 88.3 04/02/2019 1039   MCH 27.3 04/02/2019 1039   MCHC 30.9 04/02/2019 1039   RDW 18.8 (H) 04/02/2019 1039   LYMPHSABS 0.6 (L) 04/02/2019 1039   MONOABS 0.3 04/02/2019 1039   EOSABS 0.1 04/02/2019 1039   BASOSABS 0.0 04/02/2019 1039   CMP Latest Ref Rng & Units 04/02/2019 03/19/2019 03/03/2019  Glucose 70 - 99 mg/dL 174(H) 113(H) 96  BUN 8 - 23 mg/dL 10 10 11   Creatinine 0.44 - 1.00 mg/dL 0.72 0.68 0.69  Sodium 135 - 145 mmol/L 141 142 144  Potassium 3.5 - 5.1 mmol/L 3.6 3.8 4.4  Chloride 98 - 111 mmol/L 107 106 107  CO2 22 - 32 mmol/L 25 24 28   Calcium 8.9 - 10.3 mg/dL 8.9 8.8(L) 9.5  Total Protein 6.5 - 8.1 g/dL 6.5 6.6 6.7  Total Bilirubin 0.3 - 1.2 mg/dL 0.1(L) 0.4 0.5  Alkaline Phos 38 - 126 U/L 94 93 103  AST 15 - 41 U/L 18 16 17   ALT 0 - 44 U/L 15 18 17         DIAGNOSTIC IMAGING:  I have independently reviewed the scans and discussed with the patient.    ASSESSMENT & PLAN:   Adenocarcinoma of left lung ([place]) 1.  Clinical stage IIIb (T3N2) adenocarcinoma the left lung: -Presentation with hemoptysis, CT chest on 10/17/2018 showed left upper lobe lung mass, and enlarged AP window lymph node. -CT-guided biopsy on 11/19/2018 consistent with adenocarcinoma.  She smoked 1 pack/day for 15 years, quit 25 years ago. -PET scan on 12/17/2018 shows 7.3 cm left upper lobe lung mass, 1 cm short axis AP window lymph node with SUV 3.4.  Focus of increased metabolic activity in the cecum without definite CT correlate.  She reportedly had colonoscopy 3 to 4 years ago which was normal. -Chemoradiation therapy with carboplatin and paclitaxel weekly from 01/07/2019 through 02/11/2019.   -We reviewed results and images of the CT of the chest from 03/03/2019 which showed reduction in size of the left upper lobe lung mass with evolving changes of radiation therapy in the left hemithorax.  No pathologically enlarged mediastinal or hilar lymph nodes.  The left upper lobe mass currently measures 3.3 x 4 cm.  It was compared to 4.9 x 6.6 cm. -Consolidation immunotherapy with Imfinzi on 03/19/2019. -For the last 1 week she has noticed discomfort medial to the left shoulder blade.  As she moved her shoulder to relieve it, she developed slight pain.  She reported that burping improves.  She has noticed increased gas in her abdomen and is having more flatulence.  She has not changed any of her diet. -I have recommended using Gas-X and Maalox as needed.  She denied any diarrhea.  No nausea or vomiting  reported. -I have reviewed her blood work which is grossly within normal limits.  She may proceed with cycle 2 today.  2.  Meningioma: -MRI of the brain on 12/16/2018 showed 1.8 x 2.6 x 2.6 cm enhancing extra-axial mass along the left greater wing of sphenoid.  Mass has imaging  appearance of meningioma.  However dural based metastatic lesion cannot be excluded. -She does not have any left-sided facial pain. -MRI of the brain on 02/18/2019 showed similar appearance of meningioma of the greater wing of the sphenoid measuring 2.5 x 2 x 2.5 cm.  3.  Nausea/vomiting: -She did report some nausea but denied any vomiting.  We will continue Compazine as needed.   Orders placed this encounter:  Orders Placed This Encounter  Procedures  . CBC with Differential/Platelet  . Comprehensive metabolic panel      Derek Jack, MD [place] [phone]

## 2019-04-02 NOTE — Patient Instructions (Signed)
[facility] Discharge Instructions for Patients Receiving Chemotherapy  Today you received the following chemotherapy agents   To help prevent nausea and vomiting after your treatment, we encourage you to take your nausea medication   If you develop nausea and vomiting that is not controlled by your nausea medication, call the clinic.   BELOW ARE SYMPTOMS THAT SHOULD BE REPORTED IMMEDIATELY:  *FEVER GREATER THAN 100.5 F  *CHILLS WITH OR WITHOUT FEVER  NAUSEA AND VOMITING THAT IS NOT CONTROLLED WITH YOUR NAUSEA MEDICATION  *UNUSUAL SHORTNESS OF BREATH  *UNUSUAL BRUISING OR BLEEDING  TENDERNESS IN MOUTH AND THROAT WITH OR WITHOUT PRESENCE OF ULCERS  *URINARY PROBLEMS  *BOWEL PROBLEMS  UNUSUAL RASH Items with * indicate a potential emergency and should be followed up as soon as possible.  Feel free to call the clinic should you have any questions or concerns. The clinic phone number is [phone].  Please show the CHEMO ALERT CARD at check-in to the Emergency Department and triage nurse.   

## 2019-04-16 ENCOUNTER — Inpatient Hospital Stay (HOSPITAL_COMMUNITY): Payer: Medicare Other

## 2019-04-16 ENCOUNTER — Inpatient Hospital Stay (HOSPITAL_BASED_OUTPATIENT_CLINIC_OR_DEPARTMENT_OTHER): Payer: Medicare Other | Admitting: Hematology

## 2019-04-16 ENCOUNTER — Other Ambulatory Visit: Payer: Self-pay

## 2019-04-16 ENCOUNTER — Encounter (HOSPITAL_COMMUNITY): Payer: Self-pay | Admitting: Hematology

## 2019-04-16 VITALS — BP 116/67 | HR 87 | Temp 96.9°F | Resp 18

## 2019-04-16 DIAGNOSIS — Z79899 Other long term (current) drug therapy: Secondary | ICD-10-CM | POA: Diagnosis not present

## 2019-04-16 DIAGNOSIS — Z95828 Presence of other vascular implants and grafts: Secondary | ICD-10-CM

## 2019-04-16 DIAGNOSIS — C3492 Malignant neoplasm of unspecified part of left bronchus or lung: Secondary | ICD-10-CM

## 2019-04-16 DIAGNOSIS — C3412 Malignant neoplasm of upper lobe, left bronchus or lung: Secondary | ICD-10-CM | POA: Diagnosis not present

## 2019-04-16 DIAGNOSIS — D329 Benign neoplasm of meninges, unspecified: Secondary | ICD-10-CM | POA: Diagnosis not present

## 2019-04-16 DIAGNOSIS — R112 Nausea with vomiting, unspecified: Secondary | ICD-10-CM | POA: Diagnosis not present

## 2019-04-16 DIAGNOSIS — Z5112 Encounter for antineoplastic immunotherapy: Secondary | ICD-10-CM | POA: Diagnosis not present

## 2019-04-16 LAB — CBC WITH DIFFERENTIAL/PLATELET
Abs Immature Granulocytes: 0 10*3/uL (ref 0.00–0.07)
Basophils Absolute: 0 10*3/uL (ref 0.0–0.1)
Basophils Relative: 1 %
Eosinophils Absolute: 0.3 10*3/uL (ref 0.0–0.5)
Eosinophils Relative: 5 %
HCT: 35.9 % — ABNORMAL LOW (ref 36.0–46.0)
Hemoglobin: 11.1 g/dL — ABNORMAL LOW (ref 12.0–15.0)
Immature Granulocytes: 0 %
Lymphocytes Relative: 13 %
Lymphs Abs: 0.6 10*3/uL — ABNORMAL LOW (ref 0.7–4.0)
MCH: 27.5 pg (ref 26.0–34.0)
MCHC: 30.9 g/dL (ref 30.0–36.0)
MCV: 88.9 fL (ref 80.0–100.0)
Monocytes Absolute: 0.5 10*3/uL (ref 0.1–1.0)
Monocytes Relative: 11 %
Neutro Abs: 3.4 10*3/uL (ref 1.7–7.7)
Neutrophils Relative %: 70 %
Platelets: 306 10*3/uL (ref 150–400)
RBC: 4.04 MIL/uL (ref 3.87–5.11)
RDW: 17.3 % — ABNORMAL HIGH (ref 11.5–15.5)
WBC: 4.9 10*3/uL (ref 4.0–10.5)
nRBC: 0 % (ref 0.0–0.2)

## 2019-04-16 LAB — COMPREHENSIVE METABOLIC PANEL
ALT: 14 U/L (ref 0–44)
AST: 15 U/L (ref 15–41)
Albumin: 3.5 g/dL (ref 3.5–5.0)
Alkaline Phosphatase: 91 U/L (ref 38–126)
Anion gap: 10 (ref 5–15)
BUN: 9 mg/dL (ref 8–23)
CO2: 25 mmol/L (ref 22–32)
Calcium: 8.7 mg/dL — ABNORMAL LOW (ref 8.9–10.3)
Chloride: 105 mmol/L (ref 98–111)
Creatinine, Ser: 0.73 mg/dL (ref 0.44–1.00)
GFR calc Af Amer: >60 mL/min (ref >60)
GFR calc non Af Amer: >60 mL/min (ref >60)
Glucose, Bld: 105 mg/dL — ABNORMAL HIGH (ref 70–99)
Potassium: 3.8 mmol/L (ref 3.5–5.1)
Sodium: 140 mmol/L (ref 135–145)
Total Bilirubin: 0.4 mg/dL (ref 0.3–1.2)
Total Protein: 6.6 g/dL (ref 6.5–8.1)

## 2019-04-16 MED ORDER — SODIUM CHLORIDE 0.9% FLUSH
10.0000 mL | INTRAVENOUS | Status: DC | PRN
  Administered 2019-04-16: 12:00:00 10 mL

## 2019-04-16 MED ORDER — SODIUM CHLORIDE 0.9 % IV SOLN
9.9000 mg/kg | Freq: Once | INTRAVENOUS | Status: AC
  Administered 2019-04-16: 11:00:00 1240 mg via INTRAVENOUS
  Filled 2019-04-16: qty 20

## 2019-04-16 MED ORDER — HEPARIN SOD (PORK) LOCK FLUSH 100 UNIT/ML IV SOLN
500.0000 [IU] | Freq: Once | INTRAVENOUS | Status: AC | PRN
  Administered 2019-04-16: 12:00:00 500 [IU]

## 2019-04-16 MED ORDER — SODIUM CHLORIDE 0.9 % IV SOLN: Freq: Once | INTRAVENOUS | Status: AC

## 2019-04-16 NOTE — Assessment & Plan Note (Signed)
1.  Clinical stage IIIb (T3N2) adenocarcinoma the left lung: -Presentation with hemoptysis, CT chest on 10/17/2018 showed left upper lobe lung mass, and enlarged AP window lymph node. -CT-guided biopsy on 11/19/2018 consistent with adenocarcinoma.  She smoked 1 pack/day for 15 years, quit 25 years ago. -PET scan on 12/17/2018 shows 7.3 cm left upper lobe lung mass, 1 cm short axis AP window lymph node with SUV 3.4.  Focus of increased metabolic activity in the cecum without definite CT correlate.  She reportedly had colonoscopy 3 to 4 years ago which was normal. -Chemoradiation therapy with carboplatin and paclitaxel weekly from 01/07/2019 through 02/11/2019.   -We reviewed results and images of the CT of the chest from 03/03/2019 which showed reduction in size of the left upper lobe lung mass with evolving changes of radiation therapy in the left hemithorax.  No pathologically enlarged mediastinal or hilar lymph nodes.  The left upper lobe mass currently measures 3.3 x 4 cm.  It was compared to 4.9 x 6.6 cm. -Consolidation immunotherapy with Imfinzi on 03/19/2019. -Discomfort medial to the left shoulder blade has improved to completely. -She will proceed with her next cycle of durvalumab today.  I have reviewed her labs. -She will come back in 2 weeks for follow-up.  At that time I will change her durvalumab to every 4 weeks.  2.  Meningioma: -MRI of the brain on 12/16/2018 showed 1.8 x 2.6 x 2.6 cm enhancing extra-axial mass along the left greater wing of sphenoid.  Mass has imaging appearance of meningioma.  However dural based metastatic lesion cannot be excluded. -She does not have any left-sided facial pain. -MRI of the brain on 02/18/2019 showed similar appearance of meningioma of the greater wing of the sphenoid measuring 2.5 x 2 x 2.5 cm.  3.  Nausea/vomiting: -She will use Compazine for occasional nausea but denied any vomiting.  4.  Dry skin: -She has itching dry skin which started recently,  mostly in the back region.  She is having to take Benadryl at nighttime for itching.  I have told her to use oily moisturizing lotion.

## 2019-04-16 NOTE — Patient Instructions (Signed)
[facility] Discharge Instructions for Patients Receiving Chemotherapy  Today you received the following chemotherapy agents   To help prevent nausea and vomiting after your treatment, we encourage you to take your nausea medication   If you develop nausea and vomiting that is not controlled by your nausea medication, call the clinic.   BELOW ARE SYMPTOMS THAT SHOULD BE REPORTED IMMEDIATELY:  *FEVER GREATER THAN 100.5 F  *CHILLS WITH OR WITHOUT FEVER  NAUSEA AND VOMITING THAT IS NOT CONTROLLED WITH YOUR NAUSEA MEDICATION  *UNUSUAL SHORTNESS OF BREATH  *UNUSUAL BRUISING OR BLEEDING  TENDERNESS IN MOUTH AND THROAT WITH OR WITHOUT PRESENCE OF ULCERS  *URINARY PROBLEMS  *BOWEL PROBLEMS  UNUSUAL RASH Items with * indicate a potential emergency and should be followed up as soon as possible.  Feel free to call the clinic should you have any questions or concerns. The clinic phone number is [phone].  Please show the CHEMO ALERT CARD at check-in to the Emergency Department and triage nurse.   

## 2019-04-16 NOTE — Patient Instructions (Addendum)
[facility] at [facility] Discharge Instructions  You were seen today by Dr. Katragadda. He went over your recent lab results. He will see you back in 2 weeks for labs and follow up.   Thank you for choosing [facility] at [facility] to provide your oncology and hematology care.  To afford each patient quality time with our provider, please arrive at least 15 minutes before your scheduled appointment time.   If you have a lab appointment with the Cancer Center please come in thru the  Main Entrance and check in at the main information desk  You need to re-schedule your appointment should you arrive 10 or more minutes late.  We strive to give you quality time with our providers, and arriving late affects you and other patients whose appointments are after yours.  Also, if you no show three or more times for appointments you may be dismissed from the clinic at the providers discretion.     Again, thank you for choosing [facility].  Our hope is that these requests will decrease the amount of time that you wait before being seen by our physicians.       _____________________________________________________________  Should you have questions after your visit to [facility], please contact our office at [phone] between the hours of 8:00 a.m. and 4:30 p.m.  Voicemails left after 4:00 p.m. will not be returned until the following business day.  For prescription refill requests, have your pharmacy contact our office and allow 72 hours.    Cancer Center Support Programs:   > Cancer Support Group  2nd Tuesday of the month 1pm-2pm, Journey Room    

## 2019-04-16 NOTE — Progress Notes (Signed)
Patient presents today for treatment and follow up visit with Dr. Delton Coombes. Labs pending. Patient has no complaints of any pain today. Vital signs within parameters for treatment. Patient has complaints of itching with her last treatment. No rash noted per patient's words. Patient states she took Benadryl and it resolved.   Treatment given today per MD orders. Tolerated infusion without adverse affects. Vital signs stable. No complaints at this time. Discharged from clinic ambulatory. F/U with [facility] as scheduled.

## 2019-04-16 NOTE — Progress Notes (Signed)
Patient has been assessed, vital signs and labs have been reviewed by Dr. Katragadda. ANC, Creatinine, LFTs, and Platelets are within treatment parameters per Dr. Katragadda. The patient is good to proceed with treatment at this time.  

## 2019-04-16 NOTE — Progress Notes (Signed)
[place], [place] [address]   CLINIC:  Medical Oncology/Hematology  PCP:  Iona Beard, MD [address] [place] [address] [phone]   REASON FOR VISIT:  Stage III left lung adenocarcinoma.  CURRENT THERAPY: Consolidation with durvalumab.    INTERVAL HISTORY:  Ms. Erb 69 y.o. female seen for follow-up of stage III lung cancer.  She started durvalumab consolidation on 03/19/2019.  Did not have any diarrhea.  However reported dry skin and itching particularly in the back.  The pain and discomfort medial to the left scapula has improved since last 10 days.  Denies any dry cough.  Shortness of breath with exertion is stable.  Feels tired most of the time.  REVIEW OF SYSTEMS:  Review of Systems  Respiratory: Positive for shortness of breath.   All other systems reviewed and are negative.    PAST MEDICAL/SURGICAL HISTORY:  Past Medical History:  Diagnosis Date  . Anemia   . Arthritis   . Diabetes mellitus    diet controlled  . GERD (gastroesophageal reflux disease)   . HTN (hypertension)   . HTN (hypertension)   . Port-A-Cath in place 01/06/2019  . Shortness of breath dyspnea    with exertion   Past Surgical History:  Procedure Laterality Date  . CHOLECYSTECTOMY  1997  . COLONOSCOPY    . GASTRIC BYPASS    . INCISIONAL HERNIA REPAIR  04/11/11  . IR IMAGING GUIDED PORT INSERTION  12/27/2018  . LAPAROSCOPIC SALPINGOOPHERECTOMY    . LAPAROTOMY  04/11/2011   Procedure: EXPLORATORY LAPAROTOMY;  Surgeon: Joyice Faster. Cornett, MD;  Location: [facility];  Service: General;  Laterality: N/A;  closure port hole  . TOTAL HIP ARTHROPLASTY  03/07/2012   Procedure: TOTAL HIP ARTHROPLASTY ANTERIOR APPROACH;  Surgeon: Mauri Pole, MD;  Location: [facility];  Service: Orthopedics;  Laterality: Right;  . TOTAL SHOULDER ARTHROPLASTY Left 01/21/2015  . TOTAL SHOULDER ARTHROPLASTY Left 01/21/2015   Procedure: LEFT TOTAL SHOULDER ARTHROPLASTY;   Surgeon: Justice Britain, MD;  Location: [facility];  Service: Orthopedics;  Laterality: Left;  Marland Kitchen VAGINAL HYSTERECTOMY       SOCIAL HISTORY:  Social History   Socioeconomic History  . Marital status: Single    Spouse name: Not on file  . Number of children: Not on file  . Years of education: 12th grade  . Highest education level: Not on file  Occupational History  . Occupation: Employed    Employer: [employer]  Tobacco Use  . Smoking status: Former Smoker    Packs/day: 1.00    Years: 12.00    Pack years: 12.00    Types: Cigarettes    Quit date: 03/20/1976    Years since quitting: 43.1  . Smokeless tobacco: Never Used  Substance and Sexual Activity  . Alcohol use: No  . Drug use: No  . Sexual activity: Never  Other Topics Concern  . Not on file  Social History Narrative  . Not on file   Social Determinants of Health   Financial Resource Strain: Low Risk   . Difficulty of Paying Living Expenses: Not very hard  Food Insecurity: No Food Insecurity  . Worried About Charity fundraiser in the Last Year: Never true  . Ran Out of Food in the Last Year: Never true  Transportation Needs: No Transportation Needs  . Lack of Transportation (Medical): No  . Lack of Transportation (Non-Medical): No  Physical Activity: Inactive  .  Days of Exercise per Week: 0 days  . Minutes of Exercise per Session: 0 min  Stress: No Stress Concern Present  . Feeling of Stress : Only a little  Social Connections: Somewhat Isolated  . Frequency of Communication with Friends and Family: More than three times a week  . Frequency of Social Gatherings with Friends and Family: Once a week  . Attends Religious Services: More than 4 times per year  . Active Member of Clubs or Organizations: No  . Attends Archivist Meetings: Never  . Marital Status: Never married  Intimate Partner Violence: Not At Risk  . Fear of Current or Ex-Partner: No  . Emotionally Abused: No  . Physically  Abused: No  . Sexually Abused: No    FAMILY HISTORY:  Family History  Problem Relation Age of Onset  . Breast cancer Mother   . COPD Mother   . Arthritis Mother   . Diabetes Mother   . Hypertension Mother   . Hypertension Father   . Diabetes Father   . Breast cancer Sister   . Thyroid cancer Brother   . Huntington's disease Maternal Grandmother   . Heart attack Brother     CURRENT MEDICATIONS:  Outpatient Encounter Medications as of 04/16/2019  Medication Sig  . amLODipine (NORVASC) 5 MG tablet Take 5 mg by mouth every morning.   . Calcium Carbonate Antacid (TUMS PO) Take 4 tablets by mouth daily.   . Cyanocobalamin (VITAMIN B-12) 2500 MCG SUBL Place 2,500 mcg under the tongue every morning.   Hunt Oris IV Inject into the vein every 14 (fourteen) days.  . ferrous sulfate 325 (65 FE) MG tablet Take 1 tablet (325 mg total) by mouth 3 (three) times daily after meals.  . Multiple Vitamin (MULITIVITAMIN WITH MINERALS) TABS Take 1 tablet by mouth every morning.   Marland Kitchen omeprazole (PRILOSEC) 20 MG capsule Take 20 mg by mouth daily.  Marland Kitchen ALPRAZolam (XANAX) 0.25 MG tablet Take 0.25 mg by mouth as needed.   . diphenhydrAMINE (BENADRYL) 25 MG tablet Take 25 mg by mouth every 6 (six) hours as needed.  . gabapentin (NEURONTIN) 300 MG capsule Take 300 mg by mouth at bedtime as needed for pain.  Marland Kitchen ibuprofen (ADVIL,MOTRIN) 800 MG tablet Take 1 tablet (800 mg total) by mouth 3 (three) times daily. (Patient not taking: Reported on 04/02/2019)  . naproxen (NAPROSYN) 500 MG tablet Take 500 mg by mouth as needed.   . [DISCONTINUED] prochlorperazine (COMPAZINE) 10 MG tablet Take 1 tablet (10 mg total) by mouth every 6 (six) hours as needed (Nausea or vomiting). (Patient not taking: Reported on 03/05/2019)   No facility-administered encounter medications on file as of 04/16/2019.    ALLERGIES:  No Known Allergies   PHYSICAL EXAM:  ECOG Performance status: 0  Vitals:   04/16/19 0845  BP: 108/63   Pulse: 91  Resp: 20  Temp: (!) 97.5 F (36.4 C)  SpO2: 98%   Filed Weights   04/16/19 0845  Weight: 282 lb 4.8 oz (128.1 kg)    Physical Exam Vitals reviewed.  Constitutional:      Appearance: Normal appearance.  Cardiovascular:     Rate and Rhythm: Normal rate and regular rhythm.     Heart sounds: Normal heart sounds.  Pulmonary:     Effort: Pulmonary effort is normal.     Breath sounds: Normal breath sounds.  Abdominal:     General: There is no distension.     Palpations: Abdomen is soft.  There is no mass.  Musculoskeletal:        General: Swelling present.  Lymphadenopathy:     Cervical: No cervical adenopathy.  Skin:    General: Skin is warm.  Neurological:     General: No focal deficit present.     Mental Status: She is alert and oriented to person, place, and time.  Psychiatric:        Mood and Affect: Mood normal.        Behavior: Behavior normal.      LABORATORY DATA:  I have reviewed the labs as listed.  CBC    Component Value Date/Time   WBC 4.9 04/16/2019 0919   RBC 4.04 04/16/2019 0919   HGB 11.1 (L) 04/16/2019 0919   HCT 35.9 (L) 04/16/2019 0919   PLT 306 04/16/2019 0919   MCV 88.9 04/16/2019 0919   MCH 27.5 04/16/2019 0919   MCHC 30.9 04/16/2019 0919   RDW 17.3 (H) 04/16/2019 0919   LYMPHSABS 0.6 (L) 04/16/2019 0919   MONOABS 0.5 04/16/2019 0919   EOSABS 0.3 04/16/2019 0919   BASOSABS 0.0 04/16/2019 0919   CMP Latest Ref Rng & Units 04/16/2019 04/02/2019 03/19/2019  Glucose 70 - 99 mg/dL 105(H) 174(H) 113(H)  BUN 8 - 23 mg/dL 9 10 10   Creatinine 0.44 - 1.00 mg/dL 0.73 0.72 0.68  Sodium 135 - 145 mmol/L 140 141 142  Potassium 3.5 - 5.1 mmol/L 3.8 3.6 3.8  Chloride 98 - 111 mmol/L 105 107 106  CO2 22 - 32 mmol/L 25 25 24   Calcium 8.9 - 10.3 mg/dL 8.7(L) 8.9 8.8(L)  Total Protein 6.5 - 8.1 g/dL 6.6 6.5 6.6  Total Bilirubin 0.3 - 1.2 mg/dL 0.4 0.1(L) 0.4  Alkaline Phos 38 - 126 U/L 91 94 93  AST 15 - 41 U/L 15 18 16   ALT 0 - 44 U/L 14  15 18        DIAGNOSTIC IMAGING:  I have independently reviewed the scans and discussed with the patient.    ASSESSMENT & PLAN:   Adenocarcinoma of left lung ([place]) 1.  Clinical stage IIIb (T3N2) adenocarcinoma the left lung: -Presentation with hemoptysis, CT chest on 10/17/2018 showed left upper lobe lung mass, and enlarged AP window lymph node. -CT-guided biopsy on 11/19/2018 consistent with adenocarcinoma.  She smoked 1 pack/day for 15 years, quit 25 years ago. -PET scan on 12/17/2018 shows 7.3 cm left upper lobe lung mass, 1 cm short axis AP window lymph node with SUV 3.4.  Focus of increased metabolic activity in the cecum without definite CT correlate.  She reportedly had colonoscopy 3 to 4 years ago which was normal. -Chemoradiation therapy with carboplatin and paclitaxel weekly from 01/07/2019 through 02/11/2019.   -We reviewed results and images of the CT of the chest from 03/03/2019 which showed reduction in size of the left upper lobe lung mass with evolving changes of radiation therapy in the left hemithorax.  No pathologically enlarged mediastinal or hilar lymph nodes.  The left upper lobe mass currently measures 3.3 x 4 cm.  It was compared to 4.9 x 6.6 cm. -Consolidation immunotherapy with Imfinzi on 03/19/2019. -Discomfort medial to the left shoulder blade has improved to completely. -She will proceed with her next cycle of durvalumab today.  I have reviewed her labs. -She will come back in 2 weeks for follow-up.  At that time I will change her durvalumab to every 4 weeks.  2.  Meningioma: -MRI of the brain on 12/16/2018 showed 1.8 x 2.6  x 2.6 cm enhancing extra-axial mass along the left greater wing of sphenoid.  Mass has imaging appearance of meningioma.  However dural based metastatic lesion cannot be excluded. -She does not have any left-sided facial pain. -MRI of the brain on 02/18/2019 showed similar appearance of meningioma of the greater wing of the sphenoid measuring 2.5  x 2 x 2.5 cm.  3.  Nausea/vomiting: -She will use Compazine for occasional nausea but denied any vomiting.  4.  Dry skin: -She has itching dry skin which started recently, mostly in the back region.  She is having to take Benadryl at nighttime for itching.  I have told her to use oily moisturizing lotion.   Orders placed this encounter:  No orders of the defined types were placed in this encounter.     Derek Jack, MD [place] [phone]

## 2019-04-24 DIAGNOSIS — Z23 Encounter for immunization: Secondary | ICD-10-CM | POA: Diagnosis not present

## 2019-05-01 ENCOUNTER — Other Ambulatory Visit: Payer: Self-pay

## 2019-05-01 ENCOUNTER — Inpatient Hospital Stay (HOSPITAL_COMMUNITY): Payer: Medicare Other

## 2019-05-01 ENCOUNTER — Inpatient Hospital Stay (HOSPITAL_COMMUNITY): Payer: Medicare Other | Attending: Hematology | Admitting: Hematology

## 2019-05-01 ENCOUNTER — Encounter (HOSPITAL_COMMUNITY): Payer: Self-pay | Admitting: Hematology

## 2019-05-01 VITALS — BP 111/52 | HR 107 | Temp 97.0°F | Resp 20 | Wt 283.0 lb

## 2019-05-01 DIAGNOSIS — C3492 Malignant neoplasm of unspecified part of left bronchus or lung: Secondary | ICD-10-CM | POA: Diagnosis not present

## 2019-05-01 DIAGNOSIS — Z452 Encounter for adjustment and management of vascular access device: Secondary | ICD-10-CM | POA: Insufficient documentation

## 2019-05-01 DIAGNOSIS — R0602 Shortness of breath: Secondary | ICD-10-CM | POA: Diagnosis not present

## 2019-05-01 DIAGNOSIS — E119 Type 2 diabetes mellitus without complications: Secondary | ICD-10-CM | POA: Diagnosis not present

## 2019-05-01 DIAGNOSIS — Z79899 Other long term (current) drug therapy: Secondary | ICD-10-CM | POA: Insufficient documentation

## 2019-05-01 DIAGNOSIS — M1990 Unspecified osteoarthritis, unspecified site: Secondary | ICD-10-CM | POA: Insufficient documentation

## 2019-05-01 DIAGNOSIS — D329 Benign neoplasm of meninges, unspecified: Secondary | ICD-10-CM | POA: Diagnosis not present

## 2019-05-01 DIAGNOSIS — K219 Gastro-esophageal reflux disease without esophagitis: Secondary | ICD-10-CM | POA: Insufficient documentation

## 2019-05-01 DIAGNOSIS — C3412 Malignant neoplasm of upper lobe, left bronchus or lung: Secondary | ICD-10-CM | POA: Insufficient documentation

## 2019-05-01 DIAGNOSIS — R0609 Other forms of dyspnea: Secondary | ICD-10-CM

## 2019-05-01 DIAGNOSIS — R112 Nausea with vomiting, unspecified: Secondary | ICD-10-CM | POA: Insufficient documentation

## 2019-05-01 DIAGNOSIS — I10 Essential (primary) hypertension: Secondary | ICD-10-CM | POA: Insufficient documentation

## 2019-05-01 DIAGNOSIS — R0600 Dyspnea, unspecified: Secondary | ICD-10-CM | POA: Diagnosis not present

## 2019-05-01 LAB — CBC WITH DIFFERENTIAL/PLATELET
Abs Immature Granulocytes: 0.01 10*3/uL (ref 0.00–0.07)
Basophils Absolute: 0 10*3/uL (ref 0.0–0.1)
Basophils Relative: 1 %
Eosinophils Absolute: 0.3 10*3/uL (ref 0.0–0.5)
Eosinophils Relative: 5 %
HCT: 36.7 % (ref 36.0–46.0)
Hemoglobin: 11.1 g/dL — ABNORMAL LOW (ref 12.0–15.0)
Immature Granulocytes: 0 %
Lymphocytes Relative: 12 %
Lymphs Abs: 0.7 10*3/uL (ref 0.7–4.0)
MCH: 27.1 pg (ref 26.0–34.0)
MCHC: 30.2 g/dL (ref 30.0–36.0)
MCV: 89.7 fL (ref 80.0–100.0)
Monocytes Absolute: 0.4 10*3/uL (ref 0.1–1.0)
Monocytes Relative: 7 %
Neutro Abs: 4.2 10*3/uL (ref 1.7–7.7)
Neutrophils Relative %: 75 %
Platelets: 313 10*3/uL (ref 150–400)
RBC: 4.09 MIL/uL (ref 3.87–5.11)
RDW: 15.6 % — ABNORMAL HIGH (ref 11.5–15.5)
WBC: 5.6 10*3/uL (ref 4.0–10.5)
nRBC: 0 % (ref 0.0–0.2)

## 2019-05-01 LAB — COMPREHENSIVE METABOLIC PANEL
ALT: 15 U/L (ref 0–44)
AST: 16 U/L (ref 15–41)
Albumin: 3.4 g/dL — ABNORMAL LOW (ref 3.5–5.0)
Alkaline Phosphatase: 80 U/L (ref 38–126)
Anion gap: 6 (ref 5–15)
BUN: 11 mg/dL (ref 8–23)
CO2: 25 mmol/L (ref 22–32)
Calcium: 8.9 mg/dL (ref 8.9–10.3)
Chloride: 111 mmol/L (ref 98–111)
Creatinine, Ser: 0.74 mg/dL (ref 0.44–1.00)
GFR calc Af Amer: >60 mL/min (ref >60)
GFR calc non Af Amer: >60 mL/min (ref >60)
Glucose, Bld: 162 mg/dL — ABNORMAL HIGH (ref 70–99)
Potassium: 3.7 mmol/L (ref 3.5–5.1)
Sodium: 142 mmol/L (ref 135–145)
Total Bilirubin: 0.3 mg/dL (ref 0.3–1.2)
Total Protein: 6.9 g/dL (ref 6.5–8.1)

## 2019-05-01 LAB — BRAIN NATRIURETIC PEPTIDE: B Natriuretic Peptide: 43 pg/mL (ref 0.0–100.0)

## 2019-05-01 LAB — TSH: TSH: 1.791 u[IU]/mL (ref 0.350–4.500)

## 2019-05-01 MED ORDER — SODIUM CHLORIDE 0.9% FLUSH
10.0000 mL | Freq: Once | INTRAVENOUS | Status: AC
  Administered 2019-05-01: 10 mL

## 2019-05-01 MED ORDER — HEPARIN SOD (PORK) LOCK FLUSH 100 UNIT/ML IV SOLN
500.0000 [IU] | Freq: Once | INTRAVENOUS | Status: AC
  Administered 2019-05-01: 500 [IU] via INTRAVENOUS

## 2019-05-01 NOTE — Progress Notes (Signed)
Patient has been assessed, vital signs and labs have been reviewed by Dr. Delton Coombes. ANC, Creatinine, LFTs, and Platelets are within treatment parameters per Dr. Delton Coombes. He will HOLD Tx due to pt's shortness of breath, getting CT chest and ECHO.

## 2019-05-01 NOTE — Progress Notes (Signed)
Patient presents today for follow up visit with Dr. Delton Coombes and treatment. Vital signs stable. Heart Rate 107 on arrival. Labs within parameters for treatment.   Heart rate recheck 90. Documented.   Patient denies any changes since her last treatment. Patient expressed concerns about having SOB on exertion. Patient teaching performed. Patient states she only has issues with ambulating or activity. Patient denies any cough.   Message received from [facility] LPN/ Dr. Melanie Crazier treatment today due to patient's shortness of breath. Add a BNP to patient's labs, CT of the chest and Echo to be scheduled.   No complaints at this time. Discharged from clinic ambulatory. F/U with [facility] as scheduled.

## 2019-05-01 NOTE — Patient Instructions (Addendum)
[facility] at [facility] Discharge Instructions  You were seen today by Dr. Delton Coombes. He went over your recent lab results. He will HOLD your treatment today. He will schedule you for an CT and an ECHO for evaluation. He will see you back after scan and ECHO for follow up.   Thank you for choosing [place] at [facility] to provide your oncology and hematology care.  To afford each patient quality time with our provider, please arrive at least 15 minutes before your scheduled appointment time.   If you have a lab appointment with the [facility] please come in thru the  Main Entrance and check in at the main information desk  You need to re-schedule your appointment should you arrive 10 or more minutes late.  We strive to give you quality time with our providers, and arriving late affects you and other patients whose appointments are after yours.  Also, if you no show three or more times for appointments you may be dismissed from the clinic at the providers discretion.     Again, thank you for choosing [facility].  Our hope is that these requests will decrease the amount of time that you wait before being seen by our physicians.       _____________________________________________________________  Should you have questions after your visit to [facility], please contact our office at [phone] between the hours of 8:00 a.m. and 4:30 p.m.  Voicemails left after 4:00 p.m. will not be returned until the following business day.  For prescription refill requests, have your pharmacy contact our office and allow 72 hours.    Cancer Center Support Programs:   > Cancer Support Group  2nd Tuesday of the month 1pm-2pm, Journey Room

## 2019-05-01 NOTE — Progress Notes (Signed)
[place], [place] [address]   CLINIC:  Medical Oncology/Hematology  PCP:  Iona Beard, MD [address] [place] [address] [phone]   REASON FOR VISIT:  Stage III left lung adenocarcinoma.  CURRENT THERAPY: Consolidation with durvalumab.    INTERVAL HISTORY:  Denise [name] 69 y.o. female is seen for follow-up of stage III lung cancer.  She started durvalumab consolidation in December 2020.  Last treatment was on 04/16/2019.  Denies any nausea, vomiting or diarrhea.  Complains of dry skin for which she is using moisturizing lotion.  Denies any skin rashes.  She reports worsening shortness of breath on exertion.  She also feels like something is sitting on her chest when she exerts.  Energy levels are 25%.  Appetite is 100%.  REVIEW OF SYSTEMS:  Review of Systems  Constitutional: Positive for fatigue.  Respiratory: Positive for shortness of breath.   Skin: Positive for itching.  All other systems reviewed and are negative.    PAST MEDICAL/SURGICAL HISTORY:  Past Medical History:  Diagnosis Date  . Anemia   . Arthritis   . Diabetes mellitus    diet controlled  . GERD (gastroesophageal reflux disease)   . HTN (hypertension)   . HTN (hypertension)   . Port-A-Cath in place 01/06/2019  . Shortness of breath dyspnea    with exertion   Past Surgical History:  Procedure Laterality Date  . CHOLECYSTECTOMY  1997  . COLONOSCOPY    . GASTRIC BYPASS    . INCISIONAL HERNIA REPAIR  04/11/11  . IR IMAGING GUIDED PORT INSERTION  12/27/2018  . LAPAROSCOPIC SALPINGOOPHERECTOMY    . LAPAROTOMY  04/11/2011   Procedure: EXPLORATORY LAPAROTOMY;  Surgeon: Joyice Faster. Cornett, MD;  Location: [facility];  Service: General;  Laterality: N/A;  closure port hole  . TOTAL HIP ARTHROPLASTY  03/07/2012   Procedure: TOTAL HIP ARTHROPLASTY ANTERIOR APPROACH;  Surgeon: Mauri Pole, MD;  Location: [facility];  Service: Orthopedics;  Laterality: Right;  . TOTAL  SHOULDER ARTHROPLASTY Left 01/21/2015  . TOTAL SHOULDER ARTHROPLASTY Left 01/21/2015   Procedure: LEFT TOTAL SHOULDER ARTHROPLASTY;  Surgeon: Justice Britain, MD;  Location: [facility];  Service: Orthopedics;  Laterality: Left;  Marland Kitchen VAGINAL HYSTERECTOMY       SOCIAL HISTORY:  Social History   Socioeconomic History  . Marital status: Single    Spouse name: Not on file  . Number of children: Not on file  . Years of education: 12th grade  . Highest education level: Not on file  Occupational History  . Occupation: Employed    Employer: [employer]  Tobacco Use  . Smoking status: Former Smoker    Packs/day: 1.00    Years: 12.00    Pack years: 12.00    Types: Cigarettes    Quit date: 03/20/1976    Years since quitting: 43.1  . Smokeless tobacco: Never Used  Substance and Sexual Activity  . Alcohol use: No  . Drug use: No  . Sexual activity: Never  Other Topics Concern  . Not on file  Social History Narrative  . Not on file   Social Determinants of Health   Financial Resource Strain: Low Risk   . Difficulty of Paying Living Expenses: Not very hard  Food Insecurity: No Food Insecurity  . Worried About Charity fundraiser in the Last Year: Never true  . Ran Out of Food in the Last Year: Never true  Transportation Needs: No  Transportation Needs  . Lack of Transportation (Medical): No  . Lack of Transportation (Non-Medical): No  Physical Activity: Inactive  . Days of Exercise per Week: 0 days  . Minutes of Exercise per Session: 0 min  Stress: No Stress Concern Present  . Feeling of Stress : Only a little  Social Connections: Somewhat Isolated  . Frequency of Communication with Friends and Family: More than three times a week  . Frequency of Social Gatherings with Friends and Family: Once a week  . Attends Religious Services: More than 4 times per year  . Active Member of Clubs or Organizations: No  . Attends Archivist Meetings: Never  . Marital Status: Never  married  Intimate Partner Violence: Not At Risk  . Fear of Current or Ex-Partner: No  . Emotionally Abused: No  . Physically Abused: No  . Sexually Abused: No    FAMILY HISTORY:  Family History  Problem Relation Age of Onset  . Breast cancer Mother   . COPD Mother   . Arthritis Mother   . Diabetes Mother   . Hypertension Mother   . Hypertension Father   . Diabetes Father   . Breast cancer Sister   . Thyroid cancer Brother   . Huntington's disease Maternal Grandmother   . Heart attack Brother     CURRENT MEDICATIONS:  Outpatient Encounter Medications as of 05/01/2019  Medication Sig  . amLODipine (NORVASC) 5 MG tablet Take 5 mg by mouth every morning.   . Calcium Carbonate Antacid (TUMS PO) Take 4 tablets by mouth daily.   . Cyanocobalamin (VITAMIN B-12) 2500 MCG SUBL Place 2,500 mcg under the tongue every morning.   Hunt Oris IV Inject into the vein every 14 (fourteen) days.  . ferrous sulfate 325 (65 FE) MG tablet Take 1 tablet (325 mg total) by mouth 3 (three) times daily after meals.  . Multiple Vitamin (MULITIVITAMIN WITH MINERALS) TABS Take 1 tablet by mouth every morning.   Marland Kitchen omeprazole (PRILOSEC) 20 MG capsule Take 20 mg by mouth daily.  . [DISCONTINUED] prochlorperazine (COMPAZINE) 10 MG tablet Take 1 tablet (10 mg total) by mouth every 6 (six) hours as needed (Nausea or vomiting).  . ALPRAZolam (XANAX) 0.25 MG tablet Take 0.25 mg by mouth as needed.   . diphenhydrAMINE (BENADRYL) 25 MG tablet Take 25 mg by mouth every 6 (six) hours as needed.  . gabapentin (NEURONTIN) 300 MG capsule Take 300 mg by mouth at bedtime as needed for pain.  Marland Kitchen ibuprofen (ADVIL,MOTRIN) 800 MG tablet Take 1 tablet (800 mg total) by mouth 3 (three) times daily. (Patient not taking: Reported on 04/02/2019)  . naproxen (NAPROSYN) 500 MG tablet Take 500 mg by mouth as needed.    No facility-administered encounter medications on file as of 05/01/2019.    ALLERGIES:  No Known Allergies    PHYSICAL EXAM:  ECOG Performance status: 0  Vitals:   05/01/19 0903  BP: (!) 111/52  Pulse: (!) 107  Resp: 20  Temp: (!) 97 F (36.1 C)  SpO2: 97%   Filed Weights   05/01/19 0903  Weight: 283 lb (128.4 kg)    Physical Exam Vitals reviewed.  Constitutional:      Appearance: Normal appearance.  Cardiovascular:     Rate and Rhythm: Normal rate and regular rhythm.     Heart sounds: Normal heart sounds.  Pulmonary:     Effort: Pulmonary effort is normal.     Breath sounds: Normal breath sounds.  Abdominal:  General: There is no distension.     Palpations: Abdomen is soft. There is no mass.  Musculoskeletal:        General: Swelling present.  Lymphadenopathy:     Cervical: No cervical adenopathy.  Skin:    General: Skin is warm.  Neurological:     General: No focal deficit present.     Mental Status: She is alert and oriented to person, place, and time.  Psychiatric:        Mood and Affect: Mood normal.        Behavior: Behavior normal.      LABORATORY DATA:  I have reviewed the labs as listed.  CBC    Component Value Date/Time   WBC 5.6 05/01/2019 0854   RBC 4.09 05/01/2019 0854   HGB 11.1 (L) 05/01/2019 0854   HCT 36.7 05/01/2019 0854   PLT 313 05/01/2019 0854   MCV 89.7 05/01/2019 0854   MCH 27.1 05/01/2019 0854   MCHC 30.2 05/01/2019 0854   RDW 15.6 (H) 05/01/2019 0854   LYMPHSABS 0.7 05/01/2019 0854   MONOABS 0.4 05/01/2019 0854   EOSABS 0.3 05/01/2019 0854   BASOSABS 0.0 05/01/2019 0854   CMP Latest Ref Rng & Units 05/01/2019 04/16/2019 04/02/2019  Glucose 70 - 99 mg/dL 162(H) 105(H) 174(H)  BUN 8 - 23 mg/dL 11 9 10   Creatinine 0.44 - 1.00 mg/dL 0.74 0.73 0.72  Sodium 135 - 145 mmol/L 142 140 141  Potassium 3.5 - 5.1 mmol/L 3.7 3.8 3.6  Chloride 98 - 111 mmol/L 111 105 107  CO2 22 - 32 mmol/L 25 25 25   Calcium 8.9 - 10.3 mg/dL 8.9 8.7(L) 8.9  Total Protein 6.5 - 8.1 g/dL 6.9 6.6 6.5  Total Bilirubin 0.3 - 1.2 mg/dL 0.3 0.4 0.1(L)  Alkaline  Phos 38 - 126 U/L 80 91 94  AST 15 - 41 U/L 16 15 18   ALT 0 - 44 U/L 15 14 15        DIAGNOSTIC IMAGING:  I have independently reviewed the scans and discussed with the patient.    ASSESSMENT & PLAN:   Adenocarcinoma of left lung ([place]) 1.  Clinical stage IIIb (T3N2) adenocarcinoma the left lung: -Presentation with hemoptysis, CT chest on 10/17/2018 showed left upper lobe lung mass, and enlarged AP window lymph node. -CT-guided biopsy on 11/19/2018 consistent with adenocarcinoma.  She smoked 1 pack/day for 15 years, quit 25 years ago. -PET scan on 12/17/2018 shows 7.3 cm left upper lobe lung mass, 1 cm short axis AP window lymph node with SUV 3.4.  Focus of increased metabolic activity in the cecum without definite CT correlate.  She reportedly had colonoscopy 3 to 4 years ago which was normal. -Chemoradiation therapy with carboplatin and paclitaxel weekly from 01/07/2019 through 02/11/2019.   -CT of the chest from 03/03/2019 shows left upper lobe lung mass measuring 3.3 x 4 cm, improved from previous scan.  No evidence of pathologically enlarged lymph nodes. -Consolidation immunotherapy with Imfinzi started on 03/19/2019.  Last treatment was on 04/16/2019. -She reports shortness of breath on minimal exertion which is new.  Denies any dry cough. -Physical examination did not reveal any adventitious sounds. -I have reviewed her labs which are grossly within normal limits including CBC and LFTs.  Creatinine was normal. -I will hold her treatment today.  I will order CT scan of the chest to rule out pneumonitis. -I will also obtain 2D echocardiogram for further work-up of dyspnea on exertion. -I will see her back after  the scans and will decide whether to continue further immunotherapy or starting of steroids. -If there is no pneumonitis, we will switch her to once a month 1500 mg dose.  2.  Meningioma: -MRI of the brain on 12/16/2018 showed 1.8 x 2.6 x 2.6 cm enhancing extra-axial mass along  the left greater wing of sphenoid.  Mass has imaging appearance of meningioma.  However dural based metastatic lesion cannot be excluded. -MRI of the brain on 02/18/2019 showed similar appearance of meningioma of the greater wing of the sphenoid measuring 2.5 x 2 x 2.5 cm.  She does not have any headaches or left-sided facial pain at this time.  3.  Nausea/vomiting: -She is using Compazine as needed for nausea.  Denies any vomiting.  4.  Dry skin: -She has dry skin from immunotherapy.  She is using moisturizing lotion.   Orders placed this encounter:  Orders Placed This Encounter  Procedures  . CT Chest W Contrast  . Brain natriuretic peptide  . ECHOCARDIOGRAM COMPLETE      Derek Jack, [place] [phone]

## 2019-05-02 NOTE — Assessment & Plan Note (Addendum)
1.  Clinical stage IIIb (T3N2) adenocarcinoma the left lung: -Presentation with hemoptysis, CT chest on 10/17/2018 showed left upper lobe lung mass, and enlarged AP window lymph node. -CT-guided biopsy on 11/19/2018 consistent with adenocarcinoma.  She smoked 1 pack/day for 15 years, quit 25 years ago. -PET scan on 12/17/2018 shows 7.3 cm left upper lobe lung mass, 1 cm short axis AP window lymph node with SUV 3.4.  Focus of increased metabolic activity in the cecum without definite CT correlate.  She reportedly had colonoscopy 3 to 4 years ago which was normal. -Chemoradiation therapy with carboplatin and paclitaxel weekly from 01/07/2019 through 02/11/2019.   -CT of the chest from 03/03/2019 shows left upper lobe lung mass measuring 3.3 x 4 cm, improved from previous scan.  No evidence of pathologically enlarged lymph nodes. -Consolidation immunotherapy with Imfinzi started on 03/19/2019.  Last treatment was on 04/16/2019. -She reports shortness of breath on minimal exertion which is new.  Denies any dry cough. -Physical examination did not reveal any adventitious sounds. -I have reviewed her labs which are grossly within normal limits including CBC and LFTs.  Creatinine was normal. -I will hold her treatment today.  I will order CT scan of the chest to rule out pneumonitis. -I will also obtain 2D echocardiogram for further work-up of dyspnea on exertion. -I will see her back after the scans and will decide whether to continue further immunotherapy or starting of steroids. -If there is no pneumonitis, we will switch her to once a month 1500 mg dose.  2.  Meningioma: -MRI of the brain on 12/16/2018 showed 1.8 x 2.6 x 2.6 cm enhancing extra-axial mass along the left greater wing of sphenoid.  Mass has imaging appearance of meningioma.  However dural based metastatic lesion cannot be excluded. -MRI of the brain on 02/18/2019 showed similar appearance of meningioma of the greater wing of the sphenoid  measuring 2.5 x 2 x 2.5 cm.  She does not have any headaches or left-sided facial pain at this time.  3.  Nausea/vomiting: -She is using Compazine as needed for nausea.  Denies any vomiting.  4.  Dry skin: -She has dry skin from immunotherapy.  She is using moisturizing lotion.

## 2019-05-15 ENCOUNTER — Other Ambulatory Visit: Payer: Self-pay

## 2019-05-15 ENCOUNTER — Ambulatory Visit (HOSPITAL_COMMUNITY)
Admission: RE | Admit: 2019-05-15 | Discharge: 2019-05-15 | Disposition: A | Discharge status: Home / Self Care | Payer: Medicare Other | Source: Ambulatory Visit | Attending: Hematology | Admitting: Hematology

## 2019-05-15 ENCOUNTER — Ambulatory Visit (HOSPITAL_BASED_OUTPATIENT_CLINIC_OR_DEPARTMENT_OTHER)
Admission: RE | Admit: 2019-05-15 | Discharge: 2019-05-15 | Disposition: A | Discharge status: Home / Self Care | Payer: Medicare Other | Source: Ambulatory Visit | Attending: Hematology | Admitting: Hematology

## 2019-05-15 DIAGNOSIS — C3492 Malignant neoplasm of unspecified part of left bronchus or lung: Secondary | ICD-10-CM

## 2019-05-15 DIAGNOSIS — J90 Pleural effusion, not elsewhere classified: Secondary | ICD-10-CM | POA: Diagnosis not present

## 2019-05-15 DIAGNOSIS — R0609 Other forms of dyspnea: Secondary | ICD-10-CM

## 2019-05-15 DIAGNOSIS — I10 Essential (primary) hypertension: Secondary | ICD-10-CM | POA: Diagnosis not present

## 2019-05-15 DIAGNOSIS — I351 Nonrheumatic aortic (valve) insufficiency: Secondary | ICD-10-CM | POA: Insufficient documentation

## 2019-05-15 DIAGNOSIS — R0600 Dyspnea, unspecified: Secondary | ICD-10-CM

## 2019-05-15 DIAGNOSIS — E119 Type 2 diabetes mellitus without complications: Secondary | ICD-10-CM | POA: Diagnosis not present

## 2019-05-15 DIAGNOSIS — Z01818 Encounter for other preprocedural examination: Secondary | ICD-10-CM | POA: Insufficient documentation

## 2019-05-15 IMAGING — CT CT CHEST W/ CM
2 of 3 series · 15 of 36 positions shown, 18 images · IV contrast (omnipaque)
Comparison: Chest CT [DATE]

CLINICAL DATA: Patient with history of left lung cancer status post
radiation. Patient on chemotherapy.

EXAM:
CT CHEST WITH CONTRAST
TECHNIQUE: Multidetector CT imaging of the chest was performed during
intravenous contrast administration.
CONTRAST:  75mL OMNIPAQUE IOHEXOL 300 MG/ML  SOLN

[Series 2: routine chest with · axial · 0.57mm/px · z∈[+1238,+1494]mm · 12 of 152 slices shown, 15 images]
[im 12/152  mediastinal]
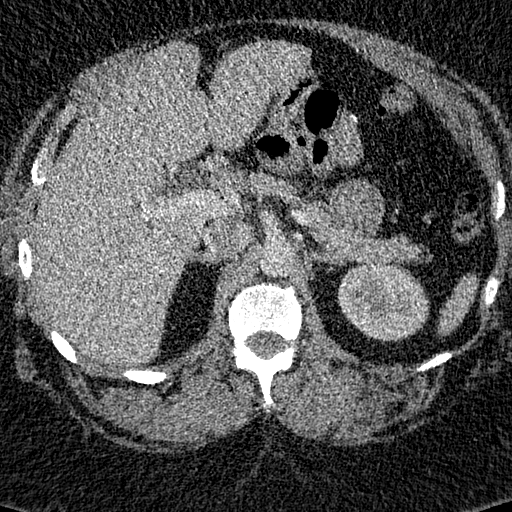
[im 12/152  lung]
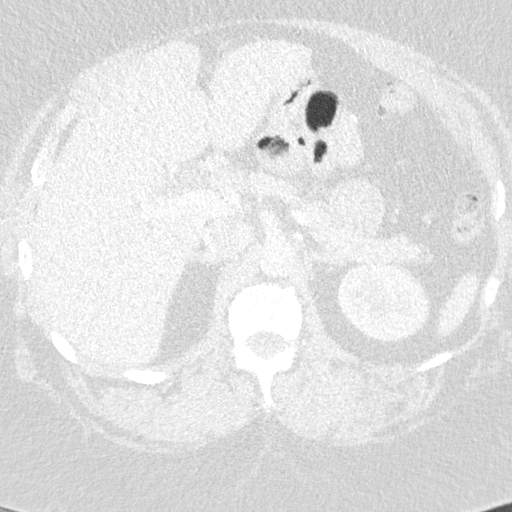
[im 23/152  lung]
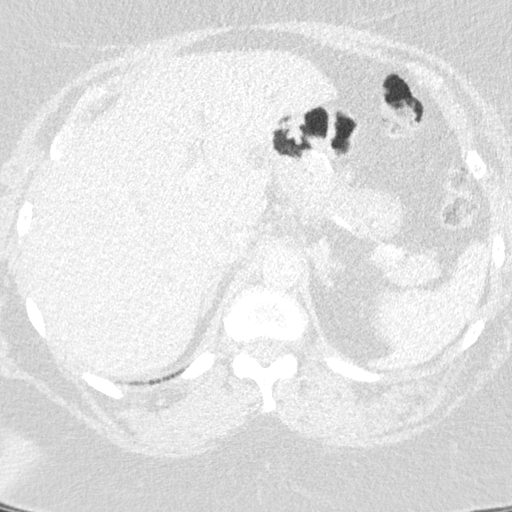
[im 34/152  lung]
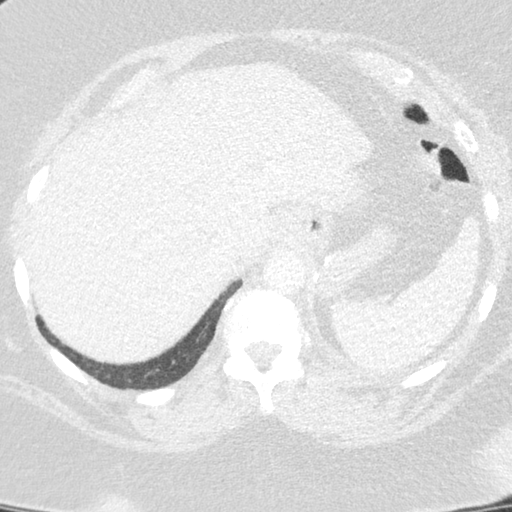
[im 45/152  lung]
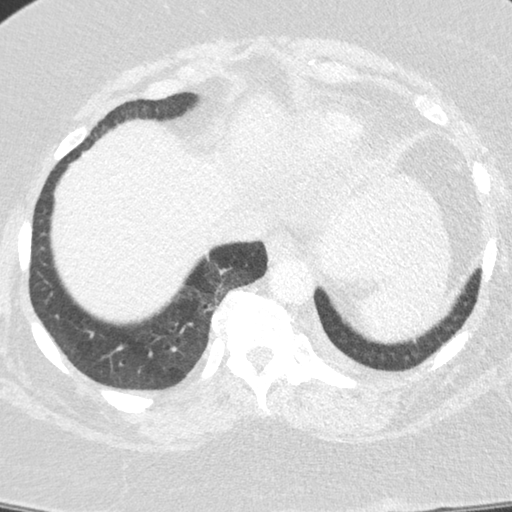
[im 56/152  mediastinal]
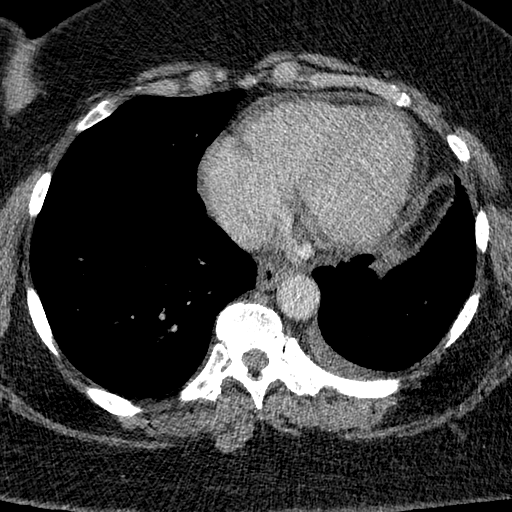
[im 56/152  lung]
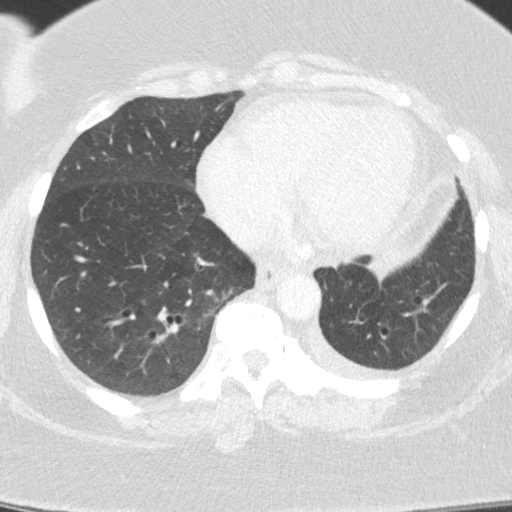
[im 68/152  lung]
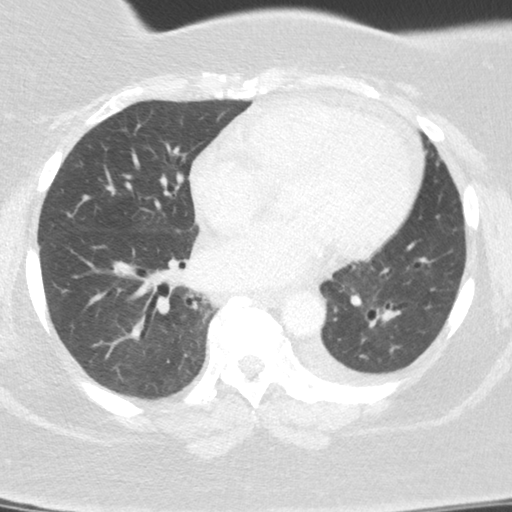
[im 84/152  lung]
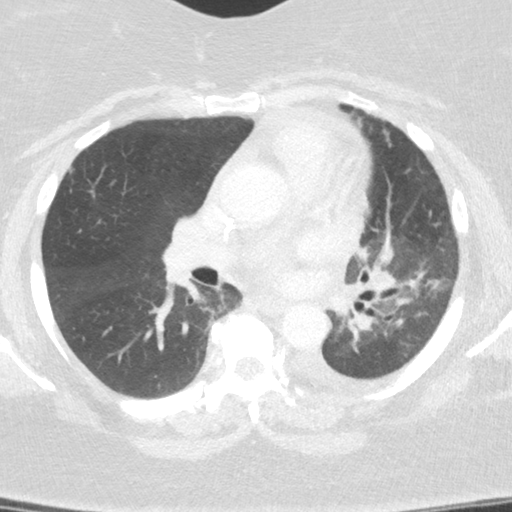
[im 96/152  lung]
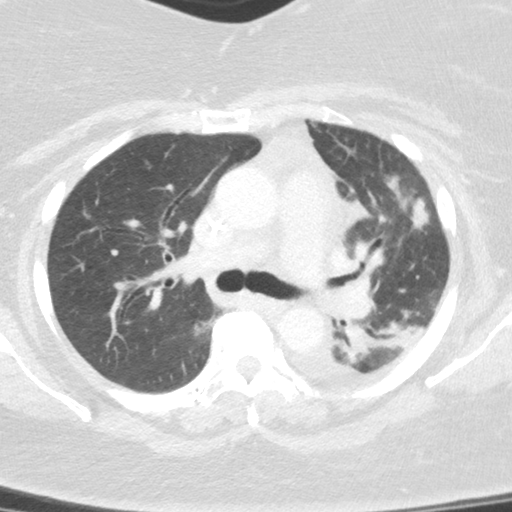
[im 107/152  mediastinal]
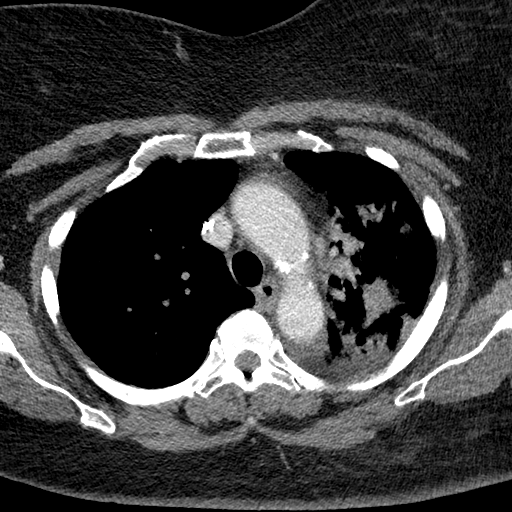
[im 107/152  lung]
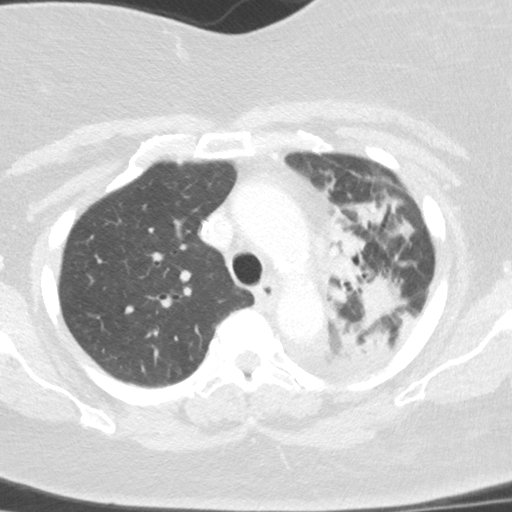
[im 118/152  lung]
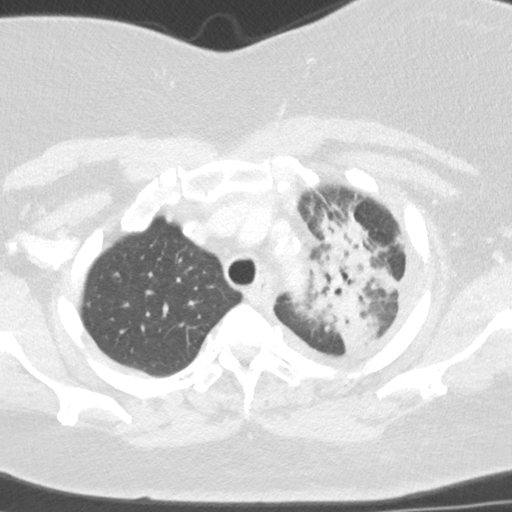
[im 129/152  lung]
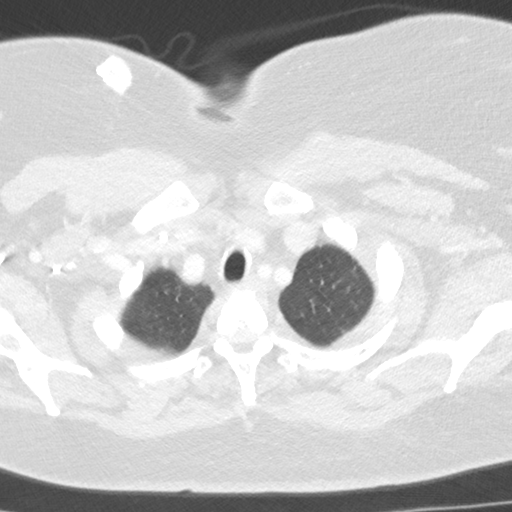
[im 140/152  lung]
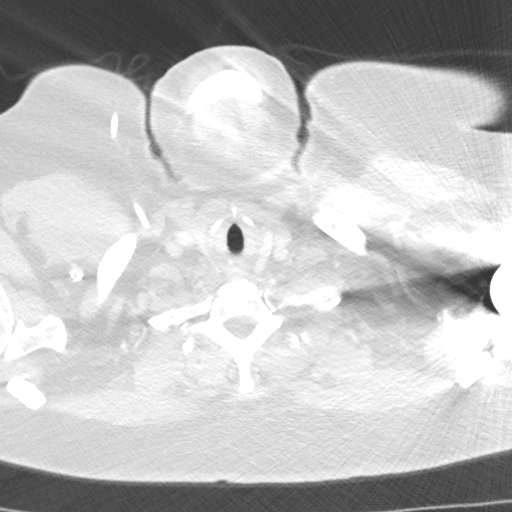

[Series 5: coronal · coronal · 0.62mm/px · 3 of 134 slices shown]
[im 27/134  lung]
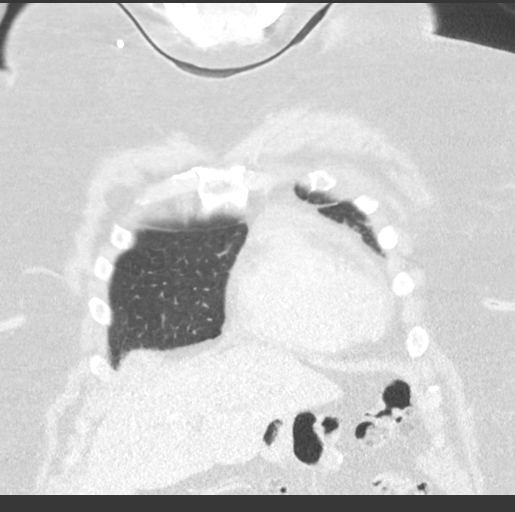
[im 54/134  lung]
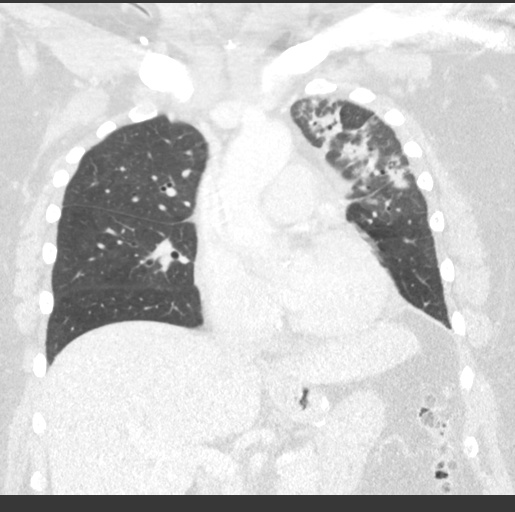
[im 80/134  lung]
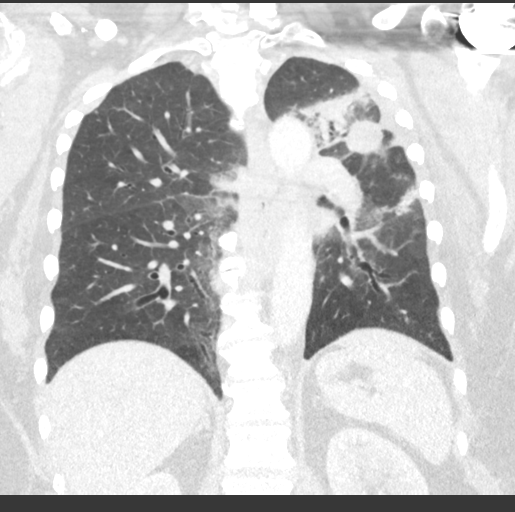

[15 of 36 positions shown; findings below may reference images not displayed]

FINDINGS: Cardiovascular: Right anterior chest wall Port-A-Cath is present
with tip terminating in the superior vena cava. Heart is enlarged.
Small pericardial effusion. Thoracic aortic vascular calcifications.
Main pulmonary artery is prominent measuring 3 cm.

Mediastinum/Nodes: No enlarged axillary, mediastinal or hilar
lymphadenopathy. Normal appearance of esophagus.

Lungs/Pleura: Central airways are patent. Interval decrease in size
of left upper lobe mass measuring 3.2 x 2.6 cm (image 43; series 4),
previously 4.0 x 3.3 cm. Interval development of sharply marginated
consolidation within the surrounding left upper lobe and superior
segment left lower lobe. New small left pleural effusion. Minimal
subpleural scarring medial right lower lobe. No pneumothorax.

Upper Abdomen: Prior cholecystectomy. Similar dilated common bile
duct measuring 14 mm. Postsurgical changes compatible with gastric
bypass procedure. No acute process. Stable 1.5 cm low-attenuation
lesion left hepatic lobe (image 133; series 2), favored to represent
hepatic cyst.

Musculoskeletal: Thoracic spine degenerative changes. No aggressive
or acute appearing osseous lesions. Postsurgical changes left
shoulder.
IMPRESSION: 1. Interval decrease in size of left upper lobe mass.
2. Interval development of sharply marginated consolidation within
the left upper lobe and superior segment left lower lobe which may
represent postradiation changes. Recommend attention on follow-up.
3. New small left pleural effusion.
4. Small pericardial effusion.

Aortic Atherosclerosis ([AS]-[AS]).

## 2019-05-15 MED ORDER — IOHEXOL 300 MG/ML  SOLN
75.0000 mL | Freq: Once | INTRAMUSCULAR | Status: AC | PRN
  Administered 2019-05-15: 75 mL via INTRAVENOUS

## 2019-05-15 NOTE — Progress Notes (Signed)
*  PRELIMINARY RESULTS* Echocardiogram 2D Echocardiogram has been performed.  Denise [name] 05/15/2019, 3:54 PM

## 2019-05-19 ENCOUNTER — Encounter (HOSPITAL_COMMUNITY): Payer: Self-pay | Admitting: Hematology

## 2019-05-19 ENCOUNTER — Other Ambulatory Visit: Payer: Self-pay

## 2019-05-19 ENCOUNTER — Inpatient Hospital Stay (HOSPITAL_COMMUNITY): Payer: Medicare Other | Attending: Hematology | Admitting: Hematology

## 2019-05-19 VITALS — BP 132/72 | HR 100 | Temp 97.1°F | Resp 19 | Wt 283.4 lb

## 2019-05-19 DIAGNOSIS — Z8249 Family history of ischemic heart disease and other diseases of the circulatory system: Secondary | ICD-10-CM | POA: Diagnosis not present

## 2019-05-19 DIAGNOSIS — J700 Acute pulmonary manifestations due to radiation: Secondary | ICD-10-CM | POA: Insufficient documentation

## 2019-05-19 DIAGNOSIS — C3492 Malignant neoplasm of unspecified part of left bronchus or lung: Secondary | ICD-10-CM

## 2019-05-19 DIAGNOSIS — Z9884 Bariatric surgery status: Secondary | ICD-10-CM | POA: Insufficient documentation

## 2019-05-19 DIAGNOSIS — D329 Benign neoplasm of meninges, unspecified: Secondary | ICD-10-CM | POA: Diagnosis not present

## 2019-05-19 DIAGNOSIS — R0602 Shortness of breath: Secondary | ICD-10-CM | POA: Diagnosis not present

## 2019-05-19 DIAGNOSIS — C3412 Malignant neoplasm of upper lobe, left bronchus or lung: Secondary | ICD-10-CM | POA: Insufficient documentation

## 2019-05-19 DIAGNOSIS — Y842 Radiological procedure and radiotherapy as the cause of abnormal reaction of the patient, or of later complication, without mention of misadventure at the time of the procedure: Secondary | ICD-10-CM | POA: Diagnosis not present

## 2019-05-19 DIAGNOSIS — Z79899 Other long term (current) drug therapy: Secondary | ICD-10-CM | POA: Diagnosis not present

## 2019-05-19 DIAGNOSIS — Z836 Family history of other diseases of the respiratory system: Secondary | ICD-10-CM | POA: Insufficient documentation

## 2019-05-19 DIAGNOSIS — Z833 Family history of diabetes mellitus: Secondary | ICD-10-CM | POA: Insufficient documentation

## 2019-05-19 DIAGNOSIS — R05 Cough: Secondary | ICD-10-CM | POA: Insufficient documentation

## 2019-05-19 DIAGNOSIS — Z8349 Family history of other endocrine, nutritional and metabolic diseases: Secondary | ICD-10-CM | POA: Insufficient documentation

## 2019-05-19 DIAGNOSIS — Z87891 Personal history of nicotine dependence: Secondary | ICD-10-CM | POA: Diagnosis not present

## 2019-05-19 DIAGNOSIS — Z803 Family history of malignant neoplasm of breast: Secondary | ICD-10-CM | POA: Diagnosis not present

## 2019-05-19 DIAGNOSIS — Z820 Family history of epilepsy and other diseases of the nervous system: Secondary | ICD-10-CM | POA: Diagnosis not present

## 2019-05-19 MED ORDER — ALBUTEROL SULFATE HFA 108 (90 BASE) MCG/ACT IN AERS: 2.0000 | INHALATION_SPRAY | Freq: Four times a day (QID) | RESPIRATORY_TRACT | 0 refills | Status: DC | PRN

## 2019-05-19 MED ORDER — PREDNISONE 20 MG PO TABS: ORAL_TABLET | ORAL | 0 refills | Status: DC

## 2019-05-19 NOTE — Assessment & Plan Note (Addendum)
1.  Radiation pneumonitis: -We reviewed CT chest dated 05/15/2019 which showed decrease in size of the left upper lobe lung mass. -However there is severe pneumonitis of the left upper lobe and lower lobe.  Right lung is clear. -We reviewed echocardiogram dated 05/15/2019 which showed EF 60 to 65%. -Patient reports easy shortness of breath on exertion and minimal walking. -We will start her on prednisone 60 mg daily and reevaluate her in 7 to 10 days.  If her breathing has improved, we will slowly taper it over the next 4 weeks.  She was told to take Mylanta/Maalox.  She is already taking Tums. -We have also prescribed her albuterol inhaler to be taken twice daily as needed.  2.  Clinical stage IIIb (T3N2) adenocarcinoma of the lung: -Chemoradiation therapy with carboplatin and paclitaxel from 01/07/2019 through 02/11/2019. -Consolidation immunotherapy with durvalumab from 03/19/2019 through 04/16/2019. -CT chest showed decrease in size of the left upper lobe lung mass measuring 3.1 x 2.5 cm, previously 3.9 x 3.2 cm. -We are holding treatment because of radiation pneumonitis.  3.  Meningioma: -MRI of the brain on 02/18/2019 showed stable meningioma of the greater wing of the sphenoid measuring 2.5 x 2 x 2.5 cm.  She does not have any headache or left-sided facial pain at this time.

## 2019-05-19 NOTE — Progress Notes (Signed)
Denise [name], Denise [name] [id number]   CLINIC:  Medical Oncology/Hematology  PCP:  Iona Beard, MD [address] [place] [address] [phone]   REASON FOR VISIT:  Stage III left lung adenocarcinoma.  CURRENT THERAPY: Consolidation with durvalumab.    INTERVAL HISTORY:  [name] 69 y.o. female seen for follow-up of CT scans and a 2D echocardiogram.  Appetite is 100%.  Energy levels are 25%.  Patient reports shortness of breath with minimal walking.  She is having to stop and take a break even if she had to walk small distances.  Chronic cough is stable.  No expectoration.  No headaches or vision changes.  REVIEW OF SYSTEMS:  Review of Systems  Respiratory: Positive for cough and shortness of breath.   All other systems reviewed and are negative.    PAST MEDICAL/SURGICAL HISTORY:  Past Medical History:  Diagnosis Date   Anemia    Arthritis    Diabetes mellitus    diet controlled   GERD (gastroesophageal reflux disease)    HTN (hypertension)    HTN (hypertension)    Port-A-Cath in place 01/06/2019   Shortness of breath dyspnea    with exertion   Past Surgical History:  Procedure Laterality Date   CHOLECYSTECTOMY  1997   COLONOSCOPY     GASTRIC BYPASS     INCISIONAL HERNIA REPAIR  04/11/11   IR IMAGING GUIDED PORT INSERTION  12/27/2018   LAPAROSCOPIC SALPINGOOPHERECTOMY     LAPAROTOMY  04/11/2011   Procedure: EXPLORATORY LAPAROTOMY;  Surgeon: Joyice Faster. Cornett, MD;  Location: [facility];  Service: General;  Laterality: N/A;  closure port hole   TOTAL HIP ARTHROPLASTY  03/07/2012   Procedure: TOTAL HIP ARTHROPLASTY ANTERIOR APPROACH;  Surgeon: Mauri Pole, MD;  Location: [facility];  Service: Orthopedics;  Laterality: Right;   TOTAL SHOULDER ARTHROPLASTY Left 01/21/2015   TOTAL SHOULDER ARTHROPLASTY Left 01/21/2015   Procedure: LEFT TOTAL SHOULDER ARTHROPLASTY;  Surgeon: Justice Britain, MD;  Location: [facility];   Service: Orthopedics;  Laterality: Left;   VAGINAL HYSTERECTOMY       SOCIAL HISTORY:  Social History   Socioeconomic History   Marital status: Single    Spouse name: Not on file   Number of children: Not on file   Years of education: 12th grade   Highest education level: Not on file  Occupational History   Occupation: Employed    Employer: [employer]  Tobacco Use   Smoking status: Former Smoker    Packs/day: 1.00    Years: 12.00    Pack years: 12.00    Types: Cigarettes    Quit date: 03/20/1976    Years since quitting: 43.1   Smokeless tobacco: Never Used  Substance and Sexual Activity   Alcohol use: No   Drug use: No   Sexual activity: Never  Other Topics Concern   Not on file  Social History Narrative   Not on file   Social Determinants of Health   Financial Resource Strain: Low Risk    Difficulty of Paying Living Expenses: Not very hard  Food Insecurity: No Food Insecurity   Worried About Charity fundraiser in the Last Year: Never true   Ran Out of Food in the Last Year: Never true  Transportation Needs: No Transportation Needs   Lack of Transportation (Medical): No   Lack of Transportation (Non-Medical): No  Physical Activity: Inactive   Days of Exercise per Week:  0 days   Minutes of Exercise per Session: 0 min  Stress: No Stress Concern Present   Feeling of Stress : Only a little  Social Connections: Somewhat Isolated   Frequency of Communication with Friends and Family: More than three times a week   Frequency of Social Gatherings with Friends and Family: Once a week   Attends Religious Services: More than 4 times per year   Active Member of [employer] or Organizations: No   Attends Music therapist: Never   Marital Status: Never married  Human resources officer Violence: Not At Risk   Fear of Current or Ex-Partner: No   Emotionally Abused: No   Physically Abused: No   Sexually Abused: No    FAMILY  HISTORY:  Family History  Problem Relation Age of Onset   Breast cancer Mother    COPD Mother    Arthritis Mother    Diabetes Mother    Hypertension Mother    Hypertension Father    Diabetes Father    Breast cancer Sister    Thyroid cancer Brother    Huntington's disease Maternal Grandmother    Heart attack Brother     CURRENT MEDICATIONS:  Outpatient Encounter Medications as of 05/19/2019  Medication Sig   amLODipine (NORVASC) 5 MG tablet Take 5 mg by mouth every morning.    Calcium Carbonate Antacid (TUMS PO) Take 4 tablets by mouth daily.    Cyanocobalamin (VITAMIN B-12) 2500 MCG SUBL Place 2,500 mcg under the tongue every morning.    DURVALUMAB IV Inject into the vein every 14 (fourteen) days.   ferrous sulfate 325 (65 FE) MG tablet Take 1 tablet (325 mg total) by mouth 3 (three) times daily after meals. (Patient taking differently: Take 325 mg by mouth daily. )   Multiple Vitamin (MULITIVITAMIN WITH MINERALS) TABS Take 1 tablet by mouth every morning.    omeprazole (PRILOSEC) 20 MG capsule Take 20 mg by mouth daily.   albuterol (VENTOLIN HFA) 108 (90 Base) MCG/ACT inhaler Inhale 2 puffs into the lungs every 6 (six) hours as needed for wheezing or shortness of breath.   ALPRAZolam (XANAX) 0.25 MG tablet Take 0.25 mg by mouth as needed.    diphenhydrAMINE (BENADRYL) 25 MG tablet Take 25 mg by mouth every 6 (six) hours as needed.   gabapentin (NEURONTIN) 300 MG capsule Take 300 mg by mouth at bedtime as needed for pain.   ibuprofen (ADVIL,MOTRIN) 800 MG tablet Take 1 tablet (800 mg total) by mouth 3 (three) times daily. (Patient not taking: Reported on 04/02/2019)   naproxen (NAPROSYN) 500 MG tablet Take 500 mg by mouth as needed.    predniSONE (DELTASONE) 20 MG tablet Take as directed   [DISCONTINUED] prochlorperazine (COMPAZINE) 10 MG tablet Take 1 tablet (10 mg total) by mouth every 6 (six) hours as needed (Nausea or vomiting).   No facility-administered  encounter medications on file as of 05/19/2019.    ALLERGIES:  No Known Allergies   PHYSICAL EXAM:  ECOG Performance status: 0  Vitals:   05/19/19 1443  BP: 132/72  Pulse: 100  Resp: 19  Temp: (!) 97.1 F (36.2 C)  SpO2: 97%   Filed Weights   05/19/19 1443  Weight: 283 lb 6.4 oz (128.5 kg)    Physical Exam Vitals reviewed.  Constitutional:      Appearance: Normal appearance.  Cardiovascular:     Rate and Rhythm: Normal rate and regular rhythm.     Heart sounds: Normal heart sounds.  Pulmonary:  Effort: Pulmonary effort is normal.     Breath sounds: Normal breath sounds.  Abdominal:     General: There is no distension.     Palpations: Abdomen is soft. There is no mass.  Musculoskeletal:        General: Swelling present.  Lymphadenopathy:     Cervical: No cervical adenopathy.  Skin:    General: Skin is warm.  Neurological:     General: No focal deficit present.     Mental Status: She is alert and oriented to person, place, and time.  Psychiatric:        Mood and Affect: Mood normal.        Behavior: Behavior normal.      LABORATORY DATA:  I have reviewed the labs as listed.  CBC    Component Value Date/Time   WBC 5.6 05/01/2019 0854   RBC 4.09 05/01/2019 0854   HGB 11.1 (L) 05/01/2019 0854   HCT 36.7 05/01/2019 0854   PLT 313 05/01/2019 0854   MCV 89.7 05/01/2019 0854   MCH 27.1 05/01/2019 0854   MCHC 30.2 05/01/2019 0854   RDW 15.6 (H) 05/01/2019 0854   LYMPHSABS 0.7 05/01/2019 0854   MONOABS 0.4 05/01/2019 0854   EOSABS 0.3 05/01/2019 0854   BASOSABS 0.0 05/01/2019 0854   CMP Latest Ref Rng & Units 05/01/2019 04/16/2019 04/02/2019  Glucose 70 - 99 mg/dL 162(H) 105(H) 174(H)  BUN 8 - 23 mg/dL 11 9 10   Creatinine 0.44 - 1.00 mg/dL 0.74 0.73 0.72  Sodium 135 - 145 mmol/L 142 140 141  Potassium 3.5 - 5.1 mmol/L 3.7 3.8 3.6  Chloride 98 - 111 mmol/L 111 105 107  CO2 22 - 32 mmol/L 25 25 25   Calcium 8.9 - 10.3 mg/dL 8.9 8.7(L) 8.9  Total  Protein 6.5 - 8.1 g/dL 6.9 6.6 6.5  Total Bilirubin 0.3 - 1.2 mg/dL 0.3 0.4 0.1(L)  Alkaline Phos 38 - 126 U/L 80 91 94  AST 15 - 41 U/L 16 15 18   ALT 0 - 44 U/L 15 14 15        DIAGNOSTIC IMAGING:  I have independently reviewed the scans and discussed with the patient.    ASSESSMENT & PLAN:   Adenocarcinoma of left lung ([place]) 1.  Radiation pneumonitis: -We reviewed CT chest dated 05/15/2019 which showed decrease in size of the left upper lobe lung mass. -However there is severe pneumonitis of the left upper lobe and lower lobe.  Right lung is clear. -We reviewed echocardiogram dated 05/15/2019 which showed EF 60 to 65%. -Patient reports easy shortness of breath on exertion and minimal walking. -We will start her on prednisone 60 mg daily and reevaluate her in 7 to 10 days.  If her breathing has improved, we will slowly taper it over the next 4 weeks.  She was told to take Mylanta/Maalox.  She is already taking Tums. -We have also prescribed her albuterol inhaler to be taken twice daily as needed.  2.  Clinical stage IIIb (T3N2) adenocarcinoma of the lung: -Chemoradiation therapy with carboplatin and paclitaxel from 01/07/2019 through 02/11/2019. -Consolidation immunotherapy with durvalumab from 03/19/2019 through 04/16/2019. -CT chest showed decrease in size of the left upper lobe lung mass measuring 3.1 x 2.5 cm, previously 3.9 x 3.2 cm. -We are holding treatment because of radiation pneumonitis.  3.  Meningioma: -MRI of the brain on 02/18/2019 showed stable meningioma of the greater wing of the sphenoid measuring 2.5 x 2 x 2.5 cm.  She does not  have any headache or left-sided facial pain at this time.   Orders placed this encounter:  Orders Placed This Encounter  Procedures   CBC with Differential/Platelet   Comprehensive metabolic panel   Magnesium      Derek Jack, MD [place] [phone]

## 2019-05-19 NOTE — Patient Instructions (Signed)
[place] at [facility] Discharge Instructions  You were seen today by Dr. Delton Coombes. He went over your recent scan results. He will send in a new prescription for Prednisone. He will see you back in 10 days for labs and follow up.    Thank you for choosing [place] at [facility] to provide your oncology and hematology care.  To afford each patient quality time with our provider, please arrive at least 15 minutes before your scheduled appointment time.   If you have a lab appointment with the [facility] please come in thru the  Main Entrance and check in at the main information desk  You need to re-schedule your appointment should you arrive 10 or more minutes late.  We strive to give you quality time with our providers, and arriving late affects you and other patients whose appointments are after yours.  Also, if you no show three or more times for appointments you may be dismissed from the clinic at the providers discretion.     Again, thank you for choosing [facility].  Our hope is that these requests will decrease the amount of time that you wait before being seen by our physicians.       _____________________________________________________________  Should you have questions after your visit to [facility], please contact our office at [phone] between the hours of 8:00 a.m. and 4:30 p.m.  Voicemails left after 4:00 p.m. will not be returned until the following business day.  For prescription refill requests, have your pharmacy contact our office and allow 72 hours.    Cancer Center Support Programs:   > Cancer Support Group  2nd Tuesday of the month 1pm-2pm, Journey Room

## 2019-05-27 ENCOUNTER — Other Ambulatory Visit: Payer: Self-pay | Admitting: Radiation Therapy

## 2019-05-29 ENCOUNTER — Inpatient Hospital Stay (HOSPITAL_COMMUNITY): Payer: Medicare Other

## 2019-05-29 ENCOUNTER — Inpatient Hospital Stay (HOSPITAL_BASED_OUTPATIENT_CLINIC_OR_DEPARTMENT_OTHER): Payer: Medicare Other | Admitting: Hematology

## 2019-05-29 ENCOUNTER — Other Ambulatory Visit: Payer: Self-pay

## 2019-05-29 DIAGNOSIS — C3492 Malignant neoplasm of unspecified part of left bronchus or lung: Secondary | ICD-10-CM

## 2019-05-29 DIAGNOSIS — R05 Cough: Secondary | ICD-10-CM | POA: Diagnosis not present

## 2019-05-29 DIAGNOSIS — J700 Acute pulmonary manifestations due to radiation: Secondary | ICD-10-CM | POA: Diagnosis not present

## 2019-05-29 DIAGNOSIS — D329 Benign neoplasm of meninges, unspecified: Secondary | ICD-10-CM | POA: Diagnosis not present

## 2019-05-29 DIAGNOSIS — Z79899 Other long term (current) drug therapy: Secondary | ICD-10-CM | POA: Diagnosis not present

## 2019-05-29 DIAGNOSIS — R0602 Shortness of breath: Secondary | ICD-10-CM | POA: Diagnosis not present

## 2019-05-29 DIAGNOSIS — C3412 Malignant neoplasm of upper lobe, left bronchus or lung: Secondary | ICD-10-CM | POA: Diagnosis not present

## 2019-05-29 LAB — CBC WITH DIFFERENTIAL/PLATELET
Abs Immature Granulocytes: 0.04 10*3/uL (ref 0.00–0.07)
Basophils Absolute: 0 10*3/uL (ref 0.0–0.1)
Basophils Relative: 0 %
Eosinophils Absolute: 0.1 10*3/uL (ref 0.0–0.5)
Eosinophils Relative: 1 %
HCT: 39.8 % (ref 36.0–46.0)
Hemoglobin: 12 g/dL (ref 12.0–15.0)
Immature Granulocytes: 0 %
Lymphocytes Relative: 14 %
Lymphs Abs: 1.4 10*3/uL (ref 0.7–4.0)
MCH: 27.5 pg (ref 26.0–34.0)
MCHC: 30.2 g/dL (ref 30.0–36.0)
MCV: 91.1 fL (ref 80.0–100.0)
Monocytes Absolute: 0.8 10*3/uL (ref 0.1–1.0)
Monocytes Relative: 9 %
Neutro Abs: 7.4 10*3/uL (ref 1.7–7.7)
Neutrophils Relative %: 76 %
Platelets: 361 10*3/uL (ref 150–400)
RBC: 4.37 MIL/uL (ref 3.87–5.11)
RDW: 14.8 % (ref 11.5–15.5)
WBC: 9.8 10*3/uL (ref 4.0–10.5)
nRBC: 0.2 % (ref 0.0–0.2)

## 2019-05-29 LAB — COMPREHENSIVE METABOLIC PANEL
ALT: 36 U/L (ref 0–44)
AST: 26 U/L (ref 15–41)
Albumin: 3.6 g/dL (ref 3.5–5.0)
Alkaline Phosphatase: 91 U/L (ref 38–126)
Anion gap: 11 (ref 5–15)
BUN: 16 mg/dL (ref 8–23)
CO2: 24 mmol/L (ref 22–32)
Calcium: 8.8 mg/dL — ABNORMAL LOW (ref 8.9–10.3)
Chloride: 107 mmol/L (ref 98–111)
Creatinine, Ser: 0.87 mg/dL (ref 0.44–1.00)
GFR calc Af Amer: >60 mL/min (ref >60)
GFR calc non Af Amer: >60 mL/min (ref >60)
Glucose, Bld: 86 mg/dL (ref 70–99)
Potassium: 3.7 mmol/L (ref 3.5–5.1)
Sodium: 142 mmol/L (ref 135–145)
Total Bilirubin: 0.5 mg/dL (ref 0.3–1.2)
Total Protein: 6.8 g/dL (ref 6.5–8.1)

## 2019-05-29 LAB — MAGNESIUM: Magnesium: 2.1 mg/dL (ref 1.7–2.4)

## 2019-05-29 NOTE — Assessment & Plan Note (Signed)
1.  Radiation pneumonitis: -CT chest on 05/15/2019 showed decrease in size of the left upper lobe lung mass.  There is severe pneumonitis in the left upper and left lower lobe.  Right lung is clear. -Prednisone 60 mg daily started on 05/19/2019. -She reported improvement in shortness of breath.  However its not completely back to normal. -She was also told to use her albuterol inhaler twice daily as needed. -I will cut back on prednisone to 40 mg daily today.  I will see her back in 2 weeks for follow-up.  I plan to taper prednisone over 6 to 8 weeks. -I reviewed her labs today which showed normal CBC and LFTs.  2.  Clinical stage IIIb (T3N2) adenocarcinoma of the lung: -Chemoradiation therapy with carboplatin and paclitaxel from 01/07/2019 through 02/11/2019. -Consolidation immunotherapy with durvalumab from 03/19/2019 through 04/16/2019. -CT scan on 05/15/2019 showed left upper lobe lung mass measuring 3.1 x 2.5 cm, previously 3.9 x 3.2 cm. -Immunotherapy on hold due to radiation pneumonitis.  3.  Meningioma: -MRI of the brain on 02/18/2019 showed stable meningioma of the greater wing of the sphenoid measuring 2.5 x 2 x 2.5 cm. -No headaches or left facial pain reported.

## 2019-05-29 NOTE — Patient Instructions (Signed)
[place] at [facility] Discharge Instructions  You were seen today by Dr. Delton Coombes. He went over your recent results. He will see you back in for labs and follow up.   Thank you for choosing [place] at [facility] to provide your oncology and hematology care.  To afford each patient quality time with our provider, please arrive at least 15 minutes before your scheduled appointment time.   If you have a lab appointment with the [facility] please come in thru the  Main Entrance and check in at the main information desk  You need to re-schedule your appointment should you arrive 10 or more minutes late.  We strive to give you quality time with our providers, and arriving late affects you and other patients whose appointments are after yours.  Also, if you no show three or more times for appointments you may be dismissed from the clinic at the providers discretion.     Again, thank you for choosing [facility].  Our hope is that these requests will decrease the amount of time that you wait before being seen by our physicians.       _____________________________________________________________  Should you have questions after your visit to [facility], please contact our office at [phone] between the hours of 8:00 a.m. and 4:30 p.m.  Voicemails left after 4:00 p.m. will not be returned until the following business day.  For prescription refill requests, have your pharmacy contact our office and allow 72 hours.    Cancer Center Support Programs:   > Cancer Support Group  2nd Tuesday of the month 1pm-2pm, Journey Room

## 2019-05-29 NOTE — Progress Notes (Signed)
[place], [place] [address]   CLINIC:  Medical Oncology/Hematology  PCP:  Iona Beard, MD [address] [place] [address] [phone]   REASON FOR VISIT:  Stage III left lung adenocarcinoma.  CURRENT THERAPY: Consolidation with durvalumab.    INTERVAL HISTORY:  Ms. Hand 69 y.o. female seen for follow-up of radiation pneumonitis.  She started prednisone 60 mg daily.  Reports appetite 100%.  Energy levels are 50%.  Shortness of breath on exertion has improved but not completely to baseline.  Reports some leg swelling.  REVIEW OF SYSTEMS:  Review of Systems  Respiratory: Positive for shortness of breath.   Cardiovascular: Positive for leg swelling.  All other systems reviewed and are negative.    PAST MEDICAL/SURGICAL HISTORY:  Past Medical History:  Diagnosis Date  . Anemia   . Arthritis   . Diabetes mellitus    diet controlled  . GERD (gastroesophageal reflux disease)   . HTN (hypertension)   . HTN (hypertension)   . Port-A-Cath in place 01/06/2019  . Shortness of breath dyspnea    with exertion   Past Surgical History:  Procedure Laterality Date  . CHOLECYSTECTOMY  1997  . COLONOSCOPY    . GASTRIC BYPASS    . INCISIONAL HERNIA REPAIR  04/11/11  . IR IMAGING GUIDED PORT INSERTION  12/27/2018  . LAPAROSCOPIC SALPINGOOPHERECTOMY    . LAPAROTOMY  04/11/2011   Procedure: EXPLORATORY LAPAROTOMY;  Surgeon: Joyice Faster. Cornett, MD;  Location: [facility];  Service: General;  Laterality: N/A;  closure port hole  . TOTAL HIP ARTHROPLASTY  03/07/2012   Procedure: TOTAL HIP ARTHROPLASTY ANTERIOR APPROACH;  Surgeon: Mauri Pole, MD;  Location: [facility];  Service: Orthopedics;  Laterality: Right;  . TOTAL SHOULDER ARTHROPLASTY Left 01/21/2015  . TOTAL SHOULDER ARTHROPLASTY Left 01/21/2015   Procedure: LEFT TOTAL SHOULDER ARTHROPLASTY;  Surgeon: Justice Britain, MD;  Location: [facility];  Service: Orthopedics;  Laterality: Left;  Marland Kitchen  VAGINAL HYSTERECTOMY       SOCIAL HISTORY:  Social History   Socioeconomic History  . Marital status: Single    Spouse name: Not on file  . Number of children: Not on file  . Years of education: 12th grade  . Highest education level: Not on file  Occupational History  . Occupation: Employed    Employer: [employer]  Tobacco Use  . Smoking status: Former Smoker    Packs/day: 1.00    Years: 12.00    Pack years: 12.00    Types: Cigarettes    Quit date: 03/20/1976    Years since quitting: 43.2  . Smokeless tobacco: Never Used  Substance and Sexual Activity  . Alcohol use: No  . Drug use: No  . Sexual activity: Never  Other Topics Concern  . Not on file  Social History Narrative  . Not on file   Social Determinants of Health   Financial Resource Strain: Low Risk   . Difficulty of Paying Living Expenses: Not very hard  Food Insecurity: No Food Insecurity  . Worried About Charity fundraiser in the Last Year: Never true  . Ran Out of Food in the Last Year: Never true  Transportation Needs: No Transportation Needs  . Lack of Transportation (Medical): No  . Lack of Transportation (Non-Medical): No  Physical Activity: Inactive  . Days of Exercise per Week: 0 days  . Minutes of Exercise per Session: 0 min  Stress: No Stress Concern  Present  . Feeling of Stress : Only a little  Social Connections: Somewhat Isolated  . Frequency of Communication with Friends and Family: More than three times a week  . Frequency of Social Gatherings with Friends and Family: Once a week  . Attends Religious Services: More than 4 times per year  . Active Member of Clubs or Organizations: No  . Attends Archivist Meetings: Never  . Marital Status: Never married  Intimate Partner Violence: Not At Risk  . Fear of Current or Ex-Partner: No  . Emotionally Abused: No  . Physically Abused: No  . Sexually Abused: No    FAMILY HISTORY:  Family History  Problem Relation Age  of Onset  . Breast cancer Mother   . COPD Mother   . Arthritis Mother   . Diabetes Mother   . Hypertension Mother   . Hypertension Father   . Diabetes Father   . Breast cancer Sister   . Thyroid cancer Brother   . Huntington's disease Maternal Grandmother   . Heart attack Brother     CURRENT MEDICATIONS:  Outpatient Encounter Medications as of 05/29/2019  Medication Sig  . amLODipine (NORVASC) 5 MG tablet Take 5 mg by mouth every morning.   . Calcium Carbonate Antacid (TUMS PO) Take 4 tablets by mouth daily.   . Cyanocobalamin (VITAMIN B-12) 2500 MCG SUBL Place 2,500 mcg under the tongue every morning.   Hunt Oris IV Inject into the vein every 14 (fourteen) days.  . ferrous sulfate 325 (65 FE) MG tablet Take 1 tablet (325 mg total) by mouth 3 (three) times daily after meals. (Patient taking differently: Take 325 mg by mouth daily. )  . Multiple Vitamin (MULITIVITAMIN WITH MINERALS) TABS Take 1 tablet by mouth every morning.   Marland Kitchen omeprazole (PRILOSEC) 20 MG capsule Take 20 mg by mouth daily.  . predniSONE (DELTASONE) 20 MG tablet Take as directed  . albuterol (VENTOLIN HFA) 108 (90 Base) MCG/ACT inhaler Inhale 2 puffs into the lungs every 6 (six) hours as needed for wheezing or shortness of breath. (Patient not taking: Reported on 05/29/2019)  . ALPRAZolam (XANAX) 0.25 MG tablet Take 0.25 mg by mouth as needed.   . diphenhydrAMINE (BENADRYL) 25 MG tablet Take 25 mg by mouth every 6 (six) hours as needed.  . gabapentin (NEURONTIN) 300 MG capsule Take 300 mg by mouth at bedtime as needed for pain.  Marland Kitchen ibuprofen (ADVIL,MOTRIN) 800 MG tablet Take 1 tablet (800 mg total) by mouth 3 (three) times daily. (Patient not taking: Reported on 04/02/2019)  . naproxen (NAPROSYN) 500 MG tablet Take 500 mg by mouth as needed.   . [DISCONTINUED] prochlorperazine (COMPAZINE) 10 MG tablet Take 1 tablet (10 mg total) by mouth every 6 (six) hours as needed (Nausea or vomiting).   No facility-administered  encounter medications on file as of 05/29/2019.    ALLERGIES:  No Known Allergies   PHYSICAL EXAM:  ECOG Performance status: 0  Vitals:   05/29/19 0953  BP: 119/68  Pulse: 88  Resp: 16  Temp: (!) 96.9 F (36.1 C)  SpO2: 98%   Filed Weights   05/29/19 0953  Weight: 277 lb (125.6 kg)    Physical Exam Vitals reviewed.  Constitutional:      Appearance: Normal appearance.  Cardiovascular:     Rate and Rhythm: Normal rate and regular rhythm.     Heart sounds: Normal heart sounds.  Pulmonary:     Effort: Pulmonary effort is normal.  Breath sounds: Normal breath sounds.  Abdominal:     General: There is no distension.     Palpations: Abdomen is soft. There is no mass.  Musculoskeletal:        General: Swelling present.  Lymphadenopathy:     Cervical: No cervical adenopathy.  Skin:    General: Skin is warm.  Neurological:     General: No focal deficit present.     Mental Status: She is alert and oriented to person, place, and time.  Psychiatric:        Mood and Affect: Mood normal.        Behavior: Behavior normal.      LABORATORY DATA:  I have reviewed the labs as listed.  CBC    Component Value Date/Time   WBC 9.8 05/29/2019 0922   RBC 4.37 05/29/2019 0922   HGB 12.0 05/29/2019 0922   HCT 39.8 05/29/2019 0922   PLT 361 05/29/2019 0922   MCV 91.1 05/29/2019 0922   MCH 27.5 05/29/2019 0922   MCHC 30.2 05/29/2019 0922   RDW 14.8 05/29/2019 0922   LYMPHSABS 1.4 05/29/2019 0922   MONOABS 0.8 05/29/2019 0922   EOSABS 0.1 05/29/2019 0922   BASOSABS 0.0 05/29/2019 0922   CMP Latest Ref Rng & Units 05/29/2019 05/01/2019 04/16/2019  Glucose 70 - 99 mg/dL 86 162(H) 105(H)  BUN 8 - 23 mg/dL 16 11 9   Creatinine 0.44 - 1.00 mg/dL 0.87 0.74 0.73  Sodium 135 - 145 mmol/L 142 142 140  Potassium 3.5 - 5.1 mmol/L 3.7 3.7 3.8  Chloride 98 - 111 mmol/L 107 111 105  CO2 22 - 32 mmol/L 24 25 25   Calcium 8.9 - 10.3 mg/dL 8.8(L) 8.9 8.7(L)  Total Protein 6.5 - 8.1 g/dL  6.8 6.9 6.6  Total Bilirubin 0.3 - 1.2 mg/dL 0.5 0.3 0.4  Alkaline Phos 38 - 126 U/L 91 80 91  AST 15 - 41 U/L 26 16 15   ALT 0 - 44 U/L 36 15 14       DIAGNOSTIC IMAGING:  I have independently reviewed her scans.    ASSESSMENT & PLAN:   Adenocarcinoma of left lung ([place]) 1.  Radiation pneumonitis: -CT chest on 05/15/2019 showed decrease in size of the left upper lobe lung mass.  There is severe pneumonitis in the left upper and left lower lobe.  Right lung is clear. -Prednisone 60 mg daily started on 05/19/2019. -She reported improvement in shortness of breath.  However its not completely back to normal. -She was also told to use her albuterol inhaler twice daily as needed. -I will cut back on prednisone to 40 mg daily today.  I will see her back in 2 weeks for follow-up.  I plan to taper prednisone over 6 to 8 weeks. -I reviewed her labs today which showed normal CBC and LFTs.  2.  Clinical stage IIIb (T3N2) adenocarcinoma of the lung: -Chemoradiation therapy with carboplatin and paclitaxel from 01/07/2019 through 02/11/2019. -Consolidation immunotherapy with durvalumab from 03/19/2019 through 04/16/2019. -CT scan on 05/15/2019 showed left upper lobe lung mass measuring 3.1 x 2.5 cm, previously 3.9 x 3.2 cm. -Immunotherapy on hold due to radiation pneumonitis.  3.  Meningioma: -MRI of the brain on 02/18/2019 showed stable meningioma of the greater wing of the sphenoid measuring 2.5 x 2 x 2.5 cm. -No headaches or left facial pain reported.   Orders placed this encounter:  No orders of the defined types were placed in this encounter.     Dirk Dress  Delton Coombes, [place] [phone]

## 2019-06-15 ENCOUNTER — Other Ambulatory Visit (HOSPITAL_COMMUNITY): Payer: Self-pay | Admitting: Hematology

## 2019-06-16 ENCOUNTER — Inpatient Hospital Stay (HOSPITAL_BASED_OUTPATIENT_CLINIC_OR_DEPARTMENT_OTHER): Payer: Medicare Other | Admitting: Hematology

## 2019-06-16 ENCOUNTER — Other Ambulatory Visit (HOSPITAL_COMMUNITY): Payer: Self-pay | Admitting: *Deleted

## 2019-06-16 ENCOUNTER — Inpatient Hospital Stay (HOSPITAL_COMMUNITY): Payer: Medicare Other

## 2019-06-16 ENCOUNTER — Other Ambulatory Visit: Payer: Self-pay

## 2019-06-16 ENCOUNTER — Encounter (HOSPITAL_COMMUNITY): Payer: Self-pay | Admitting: Hematology

## 2019-06-16 VITALS — BP 162/73 | HR 106 | Temp 96.9°F | Resp 19 | Wt 276.8 lb

## 2019-06-16 DIAGNOSIS — J700 Acute pulmonary manifestations due to radiation: Secondary | ICD-10-CM | POA: Diagnosis not present

## 2019-06-16 DIAGNOSIS — R05 Cough: Secondary | ICD-10-CM | POA: Diagnosis not present

## 2019-06-16 DIAGNOSIS — C3492 Malignant neoplasm of unspecified part of left bronchus or lung: Secondary | ICD-10-CM

## 2019-06-16 DIAGNOSIS — R0602 Shortness of breath: Secondary | ICD-10-CM | POA: Diagnosis not present

## 2019-06-16 DIAGNOSIS — Z79899 Other long term (current) drug therapy: Secondary | ICD-10-CM | POA: Diagnosis not present

## 2019-06-16 DIAGNOSIS — C3412 Malignant neoplasm of upper lobe, left bronchus or lung: Secondary | ICD-10-CM | POA: Diagnosis not present

## 2019-06-16 DIAGNOSIS — D329 Benign neoplasm of meninges, unspecified: Secondary | ICD-10-CM | POA: Diagnosis not present

## 2019-06-16 LAB — CBC WITH DIFFERENTIAL/PLATELET
Abs Immature Granulocytes: 0.03 10*3/uL (ref 0.00–0.07)
Basophils Absolute: 0 10*3/uL (ref 0.0–0.1)
Basophils Relative: 0 %
Eosinophils Absolute: 0 10*3/uL (ref 0.0–0.5)
Eosinophils Relative: 0 %
HCT: 41.2 % (ref 36.0–46.0)
Hemoglobin: 12.5 g/dL (ref 12.0–15.0)
Immature Granulocytes: 0 %
Lymphocytes Relative: 4 %
Lymphs Abs: 0.4 10*3/uL — ABNORMAL LOW (ref 0.7–4.0)
MCH: 27.3 pg (ref 26.0–34.0)
MCHC: 30.3 g/dL (ref 30.0–36.0)
MCV: 90 fL (ref 80.0–100.0)
Monocytes Absolute: 0.2 10*3/uL (ref 0.1–1.0)
Monocytes Relative: 2 %
Neutro Abs: 10.3 10*3/uL — ABNORMAL HIGH (ref 1.7–7.7)
Neutrophils Relative %: 94 %
Platelets: 337 10*3/uL (ref 150–400)
RBC: 4.58 MIL/uL (ref 3.87–5.11)
RDW: 15 % (ref 11.5–15.5)
WBC: 11 10*3/uL — ABNORMAL HIGH (ref 4.0–10.5)
nRBC: 0 % (ref 0.0–0.2)

## 2019-06-16 LAB — COMPREHENSIVE METABOLIC PANEL
ALT: 45 U/L — ABNORMAL HIGH (ref 0–44)
AST: 33 U/L (ref 15–41)
Albumin: 3.5 g/dL (ref 3.5–5.0)
Alkaline Phosphatase: 97 U/L (ref 38–126)
Anion gap: 10 (ref 5–15)
BUN: 17 mg/dL (ref 8–23)
CO2: 27 mmol/L (ref 22–32)
Calcium: 9.3 mg/dL (ref 8.9–10.3)
Chloride: 105 mmol/L (ref 98–111)
Creatinine, Ser: 0.95 mg/dL (ref 0.44–1.00)
GFR calc Af Amer: >60 mL/min (ref >60)
GFR calc non Af Amer: >60 mL/min (ref >60)
Glucose, Bld: 161 mg/dL — ABNORMAL HIGH (ref 70–99)
Potassium: 4.6 mmol/L (ref 3.5–5.1)
Sodium: 142 mmol/L (ref 135–145)
Total Bilirubin: 0.3 mg/dL (ref 0.3–1.2)
Total Protein: 6.4 g/dL — ABNORMAL LOW (ref 6.5–8.1)

## 2019-06-16 LAB — TSH: TSH: 0.488 u[IU]/mL (ref 0.350–4.500)

## 2019-06-16 NOTE — Assessment & Plan Note (Signed)
1.  Radiation pneumonitis: -CT chest on 05/18/2019 showed decrease in size of the left upper lobe lung mass.  There is severe pneumonitis in the left upper and left lower lobe.  Right lung is clear. -Prednisone 60 mg daily started on 05/19/2019, dose reduced to 40 mg daily on 05/29/2019. -Her breathing has not deteriorated since the prednisone taper.  She does have some shortness of breath on exertion but denies any worsening of it. -We will further decrease her prednisone to 20 mg daily.  I plan to see her back in 2 weeks for follow-up. -I plan to repeat CT scan of the chest with contrast prior to next visit.  2.  Clinical stage IIIb (T3N2) adenocarcinoma of the lung: -Chemoradiation therapy with carboplatin and paclitaxel from 01/07/2019 through 02/11/2019. -Consolidation immunotherapy with durvalumab from 03/19/2019 through 04/16/2019. -CT on 05/15/2019 showed left upper lobe lung mass measuring 3.1 x 2.5 cm, previously 3.9 x 3.2 cm. -Immunotherapy on hold due to radiation pneumonitis.  3.  Meningioma: -MRI of the brain on 02/18/2019 showed stable meningioma of the greater wing of the sphenoid measuring 2.5 x 2 x 2.5 cm. -No headaches or left facial pain reported.

## 2019-06-16 NOTE — Progress Notes (Signed)
[place], [place] [address]   CLINIC:  Medical Oncology/Hematology  PCP:  Iona Beard, MD [place] [address] [place] [address] [phone]   REASON FOR VISIT:  Stage III left lung adenocarcinoma.  CURRENT THERAPY: Consolidation with durvalumab.    INTERVAL HISTORY:  [name] 69 y.o. female seen for follow-up of radiation pneumonitis and lung cancer.  Currently taking prednisone 40 mg daily which was tapered 2 weeks ago.  Did not appreciate any worsening of shortness of breath on exertion since the taper.  She reports some shortness of breath on exertion which is stable.  Denies any cough or hemoptysis.  Denies any diarrhea.  No skin rashes reported.  Appetite is 100%.  Energy levels are 75%.  No pain reported.  REVIEW OF SYSTEMS:  Review of Systems  Respiratory: Positive for shortness of breath.   Cardiovascular: Positive for leg swelling.  All other systems reviewed and are negative.    PAST MEDICAL/SURGICAL HISTORY:  Past Medical History:  Diagnosis Date   Anemia    Arthritis    Diabetes mellitus    diet controlled   GERD (gastroesophageal reflux disease)    HTN (hypertension)    HTN (hypertension)    Port-A-Cath in place 01/06/2019   Shortness of breath dyspnea    with exertion   Past Surgical History:  Procedure Laterality Date   CHOLECYSTECTOMY  1997   COLONOSCOPY     GASTRIC BYPASS     INCISIONAL HERNIA REPAIR  04/11/11   IR IMAGING GUIDED PORT INSERTION  12/27/2018   LAPAROSCOPIC SALPINGOOPHERECTOMY     LAPAROTOMY  04/11/2011   Procedure: EXPLORATORY LAPAROTOMY;  Surgeon: Joyice Faster. Cornett, MD;  Location: [facility];  Service: General;  Laterality: N/A;  closure port hole   TOTAL HIP ARTHROPLASTY  03/07/2012   Procedure: TOTAL HIP ARTHROPLASTY ANTERIOR APPROACH;  Surgeon: Mauri Pole, MD;  Location: [facility];  Service: Orthopedics;  Laterality: Right;   TOTAL SHOULDER ARTHROPLASTY Left 01/21/2015     TOTAL SHOULDER ARTHROPLASTY Left 01/21/2015   Procedure: LEFT TOTAL SHOULDER ARTHROPLASTY;  Surgeon: Justice Britain, MD;  Location: [facility];  Service: Orthopedics;  Laterality: Left;   VAGINAL HYSTERECTOMY       SOCIAL HISTORY:  Social History   Socioeconomic History   Marital status: Single    Spouse name: Not on file   Number of children: Not on file   Years of education: 12th grade   Highest education level: Not on file  Occupational History   Occupation: Employed    Employer: [employer]  Tobacco Use   Smoking status: Former Smoker    Packs/day: 1.00    Years: 12.00    Pack years: 12.00    Types: Cigarettes    Quit date: 03/20/1976    Years since quitting: 43.2   Smokeless tobacco: Never Used  Substance and Sexual Activity   Alcohol use: No   Drug use: No   Sexual activity: Never  Other Topics Concern   Not on file  Social History Narrative   Not on file   Social Determinants of Health   Financial Resource Strain: Low Risk    Difficulty of Paying Living Expenses: Not very hard  Food Insecurity: No Food Insecurity   Worried About Running Out of Food in the Last Year: Never true   Ran Out of Food in the Last Year: Never true  Transportation Needs: No Transportation Needs   Lack  of Transportation (Medical): No   Lack of Transportation (Non-Medical): No  Physical Activity: Inactive   Days of Exercise per Week: 0 days   Minutes of Exercise per Session: 0 min  Stress: No Stress Concern Present   Feeling of Stress : Only a little  Social Connections: Somewhat Isolated   Frequency of Communication with Friends and Family: More than three times a week   Frequency of Social Gatherings with Friends and Family: Once a week   Attends Religious Services: More than 4 times per year   Active Member of [employer] or Organizations: No   Attends Music therapist: Never   Marital Status: Never married  Human resources officer Violence:  Not At Risk   Fear of Current or Ex-Partner: No   Emotionally Abused: No   Physically Abused: No   Sexually Abused: No    FAMILY HISTORY:  Family History  Problem Relation Age of Onset   Breast cancer Mother    COPD Mother    Arthritis Mother    Diabetes Mother    Hypertension Mother    Hypertension Father    Diabetes Father    Breast cancer Sister    Thyroid cancer Brother    Huntington's disease Maternal Grandmother    Heart attack Brother     CURRENT MEDICATIONS:  Outpatient Encounter Medications as of 06/16/2019  Medication Sig   amLODipine (NORVASC) 5 MG tablet Take 5 mg by mouth every morning.    Calcium Carbonate Antacid (TUMS PO) Take 4 tablets by mouth daily.    Cyanocobalamin (VITAMIN B-12) 2500 MCG SUBL Place 2,500 mcg under the tongue every morning.    DURVALUMAB IV Inject into the vein every 14 (fourteen) days.   ferrous sulfate 325 (65 FE) MG tablet Take 1 tablet (325 mg total) by mouth 3 (three) times daily after meals. (Patient taking differently: Take 325 mg by mouth daily. )   Multiple Vitamin (MULITIVITAMIN WITH MINERALS) TABS Take 1 tablet by mouth every morning.    omeprazole (PRILOSEC) 20 MG capsule Take 20 mg by mouth daily.   predniSONE (DELTASONE) 20 MG tablet Take as directed   albuterol (VENTOLIN HFA) 108 (90 Base) MCG/ACT inhaler TAKE 2 PUFFS BY MOUTH EVERY 6 HOURS AS NEEDED FOR WHEEZE OR SHORTNESS OF BREATH (Patient not taking: Reported on 06/16/2019)   ALPRAZolam (XANAX) 0.25 MG tablet Take 0.25 mg by mouth as needed.    diphenhydrAMINE (BENADRYL) 25 MG tablet Take 25 mg by mouth every 6 (six) hours as needed.   gabapentin (NEURONTIN) 300 MG capsule Take 300 mg by mouth at bedtime as needed for pain.   ibuprofen (ADVIL,MOTRIN) 800 MG tablet Take 1 tablet (800 mg total) by mouth 3 (three) times daily. (Patient not taking: Reported on 04/02/2019)   naproxen (NAPROSYN) 500 MG tablet Take 500 mg by mouth as needed.     [DISCONTINUED] prochlorperazine (COMPAZINE) 10 MG tablet Take 1 tablet (10 mg total) by mouth every 6 (six) hours as needed (Nausea or vomiting).   No facility-administered encounter medications on file as of 06/16/2019.    ALLERGIES:  No Known Allergies   PHYSICAL EXAM:  ECOG Performance status: 0  Vitals:   06/16/19 1505  BP: (!) 162/73  Pulse: (!) 106  Resp: 19  Temp: (!) 96.9 F (36.1 C)  SpO2: 100%   Filed Weights   06/16/19 1505  Weight: 276 lb 12.8 oz (125.6 kg)    Physical Exam Vitals reviewed.  Constitutional:  Appearance: Normal appearance.  Cardiovascular:     Rate and Rhythm: Normal rate and regular rhythm.     Heart sounds: Normal heart sounds.  Pulmonary:     Effort: Pulmonary effort is normal.     Breath sounds: Normal breath sounds.  Abdominal:     General: There is no distension.     Palpations: Abdomen is soft. There is no mass.  Musculoskeletal:        General: Swelling present.  Lymphadenopathy:     Cervical: No cervical adenopathy.  Skin:    General: Skin is warm.  Neurological:     General: No focal deficit present.     Mental Status: She is alert and oriented to person, place, and time.  Psychiatric:        Mood and Affect: Mood normal.        Behavior: Behavior normal.      LABORATORY DATA:  I have reviewed the labs as listed.  CBC    Component Value Date/Time   WBC 11.0 (H) 06/16/2019 1341   RBC 4.58 06/16/2019 1341   HGB 12.5 06/16/2019 1341   HCT 41.2 06/16/2019 1341   PLT 337 06/16/2019 1341   MCV 90.0 06/16/2019 1341   MCH 27.3 06/16/2019 1341   MCHC 30.3 06/16/2019 1341   RDW 15.0 06/16/2019 1341   LYMPHSABS 0.4 (L) 06/16/2019 1341   MONOABS 0.2 06/16/2019 1341   EOSABS 0.0 06/16/2019 1341   BASOSABS 0.0 06/16/2019 1341   CMP Latest Ref Rng & Units 06/16/2019 05/29/2019 05/01/2019  Glucose 70 - 99 mg/dL 161(H) 86 162(H)  BUN 8 - 23 mg/dL 17 16 11   Creatinine 0.44 - 1.00 mg/dL 0.95 0.87 0.74  Sodium 135 - 145  mmol/L 142 142 142  Potassium 3.5 - 5.1 mmol/L 4.6 3.7 3.7  Chloride 98 - 111 mmol/L 105 107 111  CO2 22 - 32 mmol/L 27 24 25   Calcium 8.9 - 10.3 mg/dL 9.3 8.8(L) 8.9  Total Protein 6.5 - 8.1 g/dL 6.4(L) 6.8 6.9  Total Bilirubin 0.3 - 1.2 mg/dL 0.3 0.5 0.3  Alkaline Phos 38 - 126 U/L 97 91 80  AST 15 - 41 U/L 33 26 16  ALT 0 - 44 U/L 45(H) 36 15       DIAGNOSTIC IMAGING:  I have reviewed the scans.    ASSESSMENT & PLAN:   Adenocarcinoma of left lung ([place]) 1.  Radiation pneumonitis: -CT chest on 05/18/2019 showed decrease in size of the left upper lobe lung mass.  There is severe pneumonitis in the left upper and left lower lobe.  Right lung is clear. -Prednisone 60 mg daily started on 05/19/2019, dose reduced to 40 mg daily on 05/29/2019. -Her breathing has not deteriorated since the prednisone taper.  She does have some shortness of breath on exertion but denies any worsening of it. -We will further decrease her prednisone to 20 mg daily.  I plan to see her back in 2 weeks for follow-up. -I plan to repeat CT scan of the chest with contrast prior to next visit.  2.  Clinical stage IIIb (T3N2) adenocarcinoma of the lung: -Chemoradiation therapy with carboplatin and paclitaxel from 01/07/2019 through 02/11/2019. -Consolidation immunotherapy with durvalumab from 03/19/2019 through 04/16/2019. -CT on 05/15/2019 showed left upper lobe lung mass measuring 3.1 x 2.5 cm, previously 3.9 x 3.2 cm. -Immunotherapy on hold due to radiation pneumonitis.  3.  Meningioma: -MRI of the brain on 02/18/2019 showed stable meningioma of the greater wing of  the sphenoid measuring 2.5 x 2 x 2.5 cm. -No headaches or left facial pain reported.   Orders placed this encounter:  Orders Placed This Encounter  Procedures   CT Chest W Contrast   CBC with Differential/Platelet   Comprehensive metabolic panel      Derek Jack, MD [place] [phone]

## 2019-06-16 NOTE — Patient Instructions (Addendum)
[place] at [facility] Discharge Instructions  You were seen today by Dr. Delton Coombes. He went over your recent lab results. Decrease your prednisone to 1 tablet (20mg ) daily. He will repeat a CT scan prior to your next visit. He will see you back in 2 weeks for labs and follow up.   Thank you for choosing [place] at [facility] to provide your oncology and hematology care.  To afford each patient quality time with our provider, please arrive at least 15 minutes before your scheduled appointment time.   If you have a lab appointment with the [facility] please come in thru the  Main Entrance and check in at the main information desk  You need to re-schedule your appointment should you arrive 10 or more minutes late.  We strive to give you quality time with our providers, and arriving late affects you and other patients whose appointments are after yours.  Also, if you no show three or more times for appointments you may be dismissed from the clinic at the providers discretion.     Again, thank you for choosing [facility].  Our hope is that these requests will decrease the amount of time that you wait before being seen by our physicians.       _____________________________________________________________  Should you have questions after your visit to [facility], please contact our office at [phone] between the hours of 8:00 a.m. and 4:30 p.m.  Voicemails left after 4:00 p.m. will not be returned until the following business day.  For prescription refill requests, have your pharmacy contact our office and allow 72 hours.    Cancer Center Support Programs:   > Cancer Support Group  2nd Tuesday of the month 1pm-2pm, Journey Room

## 2019-07-01 ENCOUNTER — Ambulatory Visit (HOSPITAL_COMMUNITY)
Admission: RE | Admit: 2019-07-01 | Discharge: 2019-07-01 | Disposition: A | Discharge status: Home / Self Care | Payer: No Typology Code available for payment source | Source: Ambulatory Visit | Attending: Hematology | Admitting: Hematology

## 2019-07-01 ENCOUNTER — Inpatient Hospital Stay (HOSPITAL_COMMUNITY): Payer: Medicare Other | Attending: Hematology

## 2019-07-01 ENCOUNTER — Other Ambulatory Visit: Payer: Self-pay

## 2019-07-01 DIAGNOSIS — D320 Benign neoplasm of cerebral meninges: Secondary | ICD-10-CM | POA: Insufficient documentation

## 2019-07-01 DIAGNOSIS — E119 Type 2 diabetes mellitus without complications: Secondary | ICD-10-CM | POA: Diagnosis not present

## 2019-07-01 DIAGNOSIS — C3492 Malignant neoplasm of unspecified part of left bronchus or lung: Secondary | ICD-10-CM | POA: Insufficient documentation

## 2019-07-01 DIAGNOSIS — I10 Essential (primary) hypertension: Secondary | ICD-10-CM | POA: Insufficient documentation

## 2019-07-01 LAB — COMPREHENSIVE METABOLIC PANEL
ALT: 34 U/L (ref 0–44)
AST: 21 U/L (ref 15–41)
Albumin: 3.6 g/dL (ref 3.5–5.0)
Alkaline Phosphatase: 97 U/L (ref 38–126)
Anion gap: 9 (ref 5–15)
BUN: 15 mg/dL (ref 8–23)
CO2: 27 mmol/L (ref 22–32)
Calcium: 9 mg/dL (ref 8.9–10.3)
Chloride: 106 mmol/L (ref 98–111)
Creatinine, Ser: 0.86 mg/dL (ref 0.44–1.00)
GFR calc Af Amer: >60 mL/min (ref >60)
GFR calc non Af Amer: >60 mL/min (ref >60)
Glucose, Bld: 111 mg/dL — ABNORMAL HIGH (ref 70–99)
Potassium: 4.6 mmol/L (ref 3.5–5.1)
Sodium: 142 mmol/L (ref 135–145)
Total Bilirubin: 0.4 mg/dL (ref 0.3–1.2)
Total Protein: 6.6 g/dL (ref 6.5–8.1)

## 2019-07-01 LAB — CBC WITH DIFFERENTIAL/PLATELET
Abs Immature Granulocytes: 0.04 10*3/uL (ref 0.00–0.07)
Basophils Absolute: 0 10*3/uL (ref 0.0–0.1)
Basophils Relative: 0 %
Eosinophils Absolute: 0.1 10*3/uL (ref 0.0–0.5)
Eosinophils Relative: 1 %
HCT: 41.7 % (ref 36.0–46.0)
Hemoglobin: 12.3 g/dL (ref 12.0–15.0)
Immature Granulocytes: 0 %
Lymphocytes Relative: 6 %
Lymphs Abs: 0.6 10*3/uL — ABNORMAL LOW (ref 0.7–4.0)
MCH: 26.7 pg (ref 26.0–34.0)
MCHC: 29.5 g/dL — ABNORMAL LOW (ref 30.0–36.0)
MCV: 90.7 fL (ref 80.0–100.0)
Monocytes Absolute: 0.4 10*3/uL (ref 0.1–1.0)
Monocytes Relative: 4 %
Neutro Abs: 9 10*3/uL — ABNORMAL HIGH (ref 1.7–7.7)
Neutrophils Relative %: 89 %
Platelets: 341 10*3/uL (ref 150–400)
RBC: 4.6 MIL/uL (ref 3.87–5.11)
RDW: 15.5 % (ref 11.5–15.5)
WBC: 10.1 10*3/uL (ref 4.0–10.5)
nRBC: 0 % (ref 0.0–0.2)

## 2019-07-02 ENCOUNTER — Ambulatory Visit (HOSPITAL_COMMUNITY)
Admission: RE | Admit: 2019-07-02 | Discharge: 2019-07-02 | Disposition: A | Discharge status: Home / Self Care | Payer: Medicare Other | Source: Ambulatory Visit | Attending: Hematology | Admitting: Hematology

## 2019-07-02 ENCOUNTER — Other Ambulatory Visit (HOSPITAL_COMMUNITY): Payer: No Typology Code available for payment source

## 2019-07-02 DIAGNOSIS — C3492 Malignant neoplasm of unspecified part of left bronchus or lung: Secondary | ICD-10-CM | POA: Insufficient documentation

## 2019-07-02 IMAGING — CT CT CHEST W/ CM
2 of 3 series · 15 of 36 positions shown, 18 images · IV contrast (Omnipaque or Isovue)
Comparison: [DATE]

CLINICAL DATA: Restaging adenocarcinoma of the left lung.

EXAM:
CT CHEST WITH CONTRAST
TECHNIQUE: Multidetector CT imaging of the chest was performed during
intravenous contrast administration.
CONTRAST:  75mL OMNIPAQUE IOHEXOL 300 MG/ML  SOLN

[Series 2: routine chest with · axial · 0.60mm/px · z∈[+1236,+1490]mm · 12 of 151 slices shown, 15 images]
[im 12/151  mediastinal]
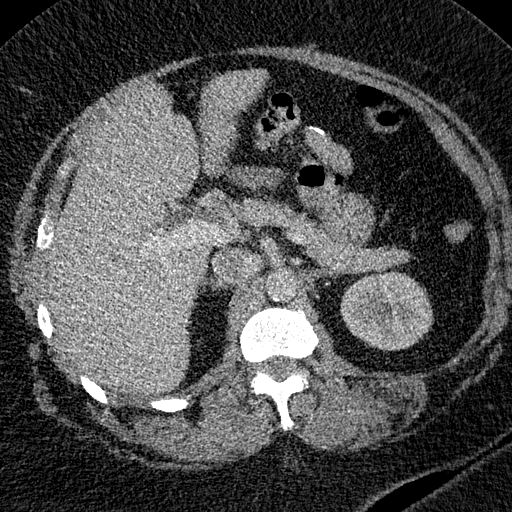
[im 12/151  lung]
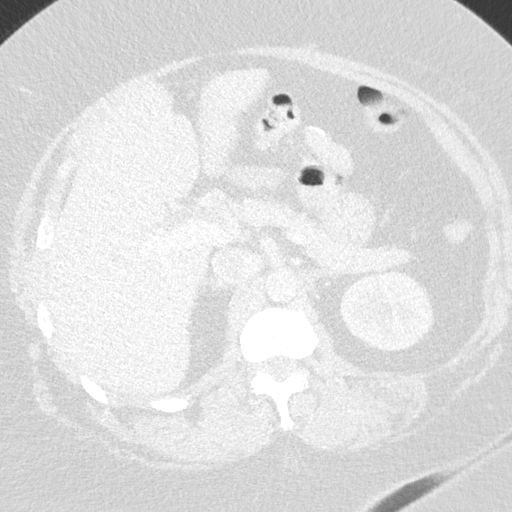
[im 23/151  lung]
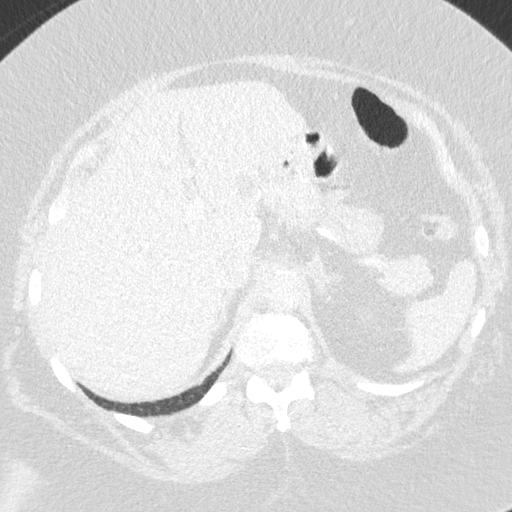
[im 34/151  lung]
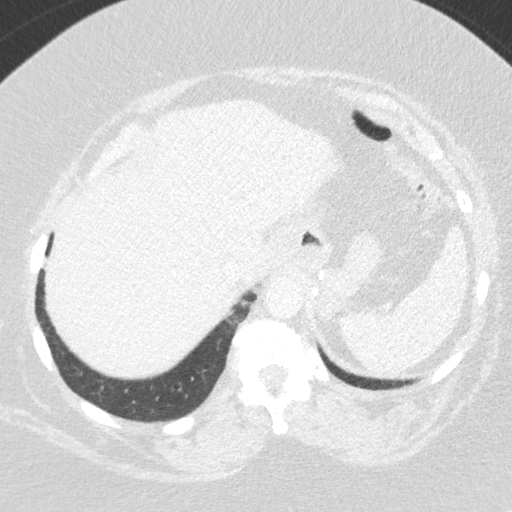
[im 45/151  lung]
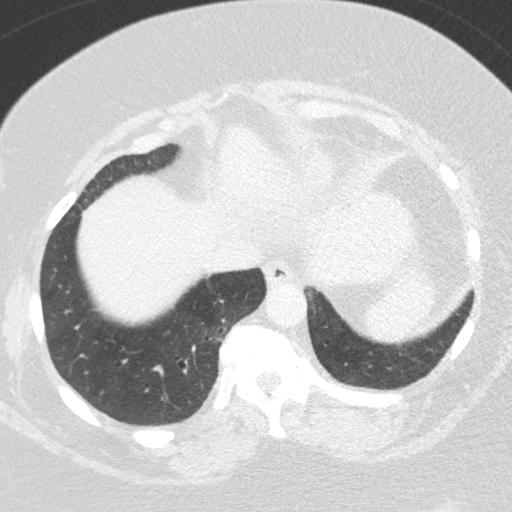
[im 56/151  mediastinal]
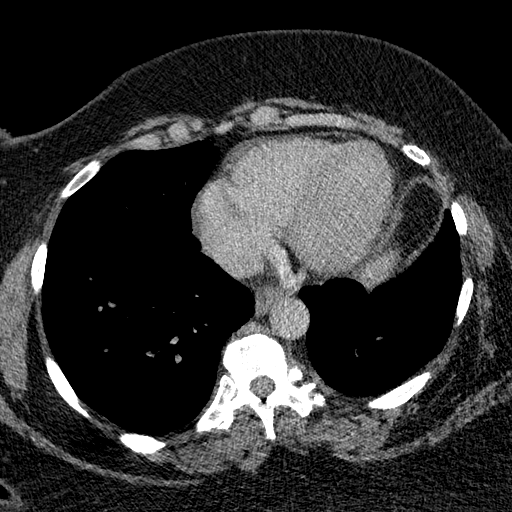
[im 56/151  lung]
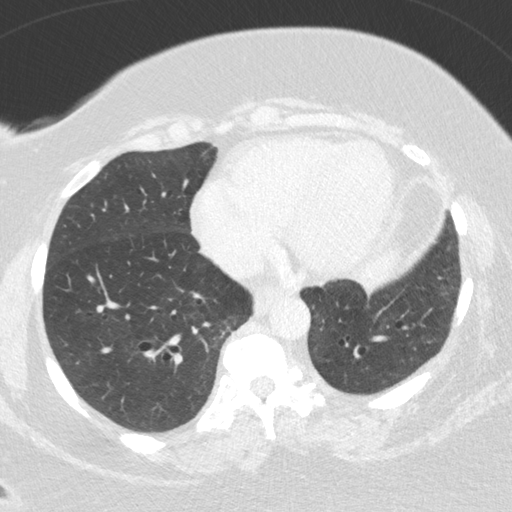
[im 67/151  lung]
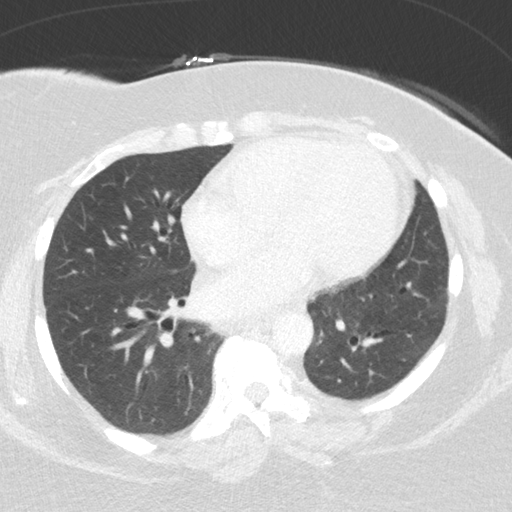
[im 84/151  lung]
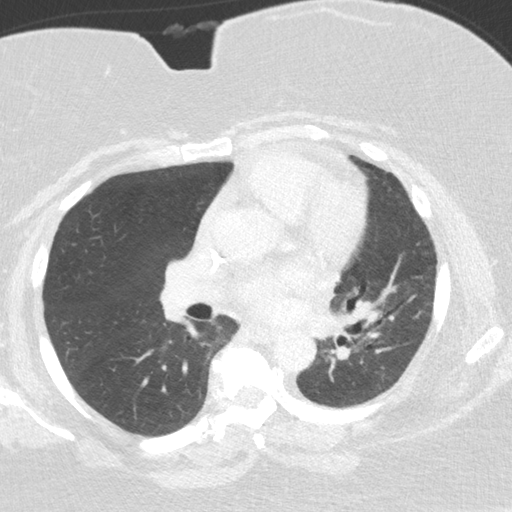
[im 95/151  lung]
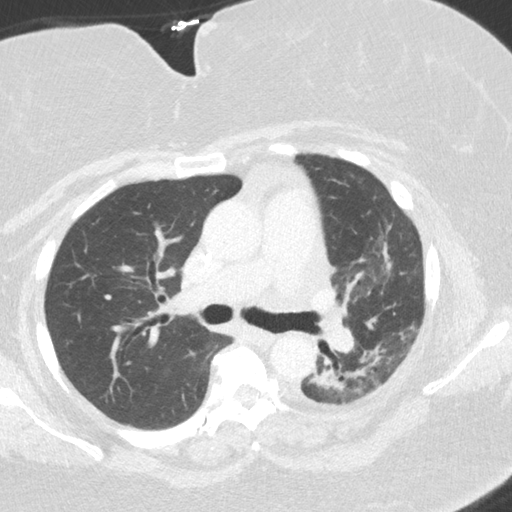
[im 106/151  mediastinal]
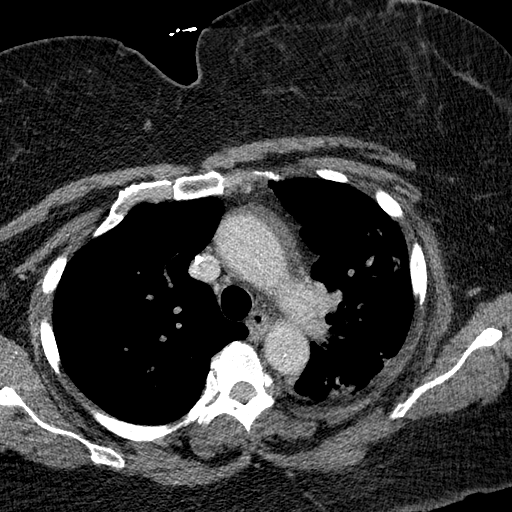
[im 106/151  lung]
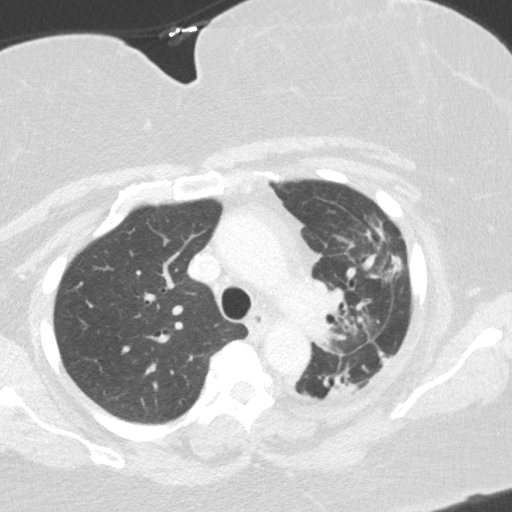
[im 117/151  lung]
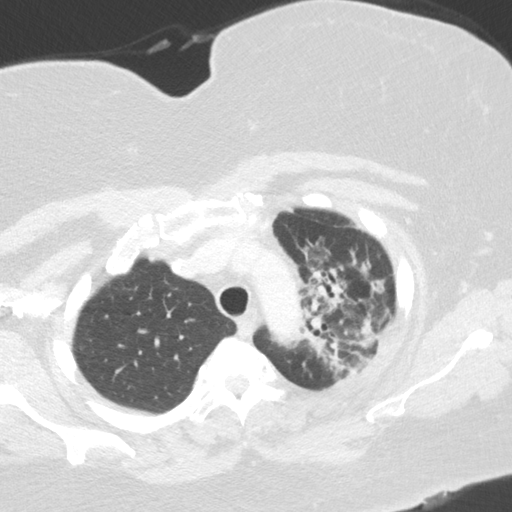
[im 128/151  lung]
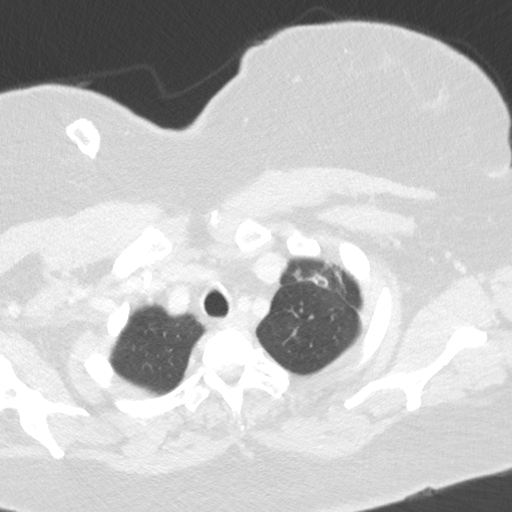
[im 139/151  lung]
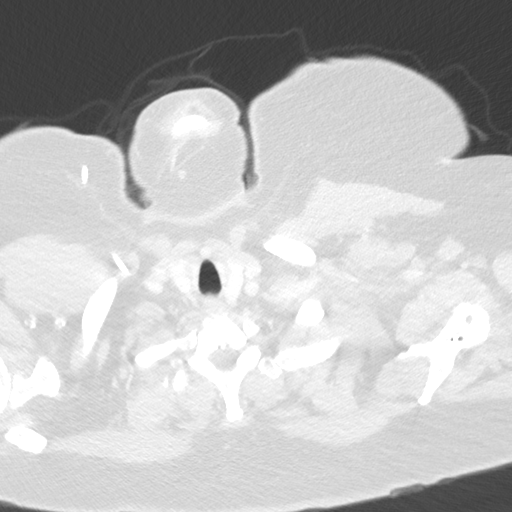

[Series 5: coronal · coronal · 0.60mm/px · 3 of 134 slices shown]
[im 27/134  lung]
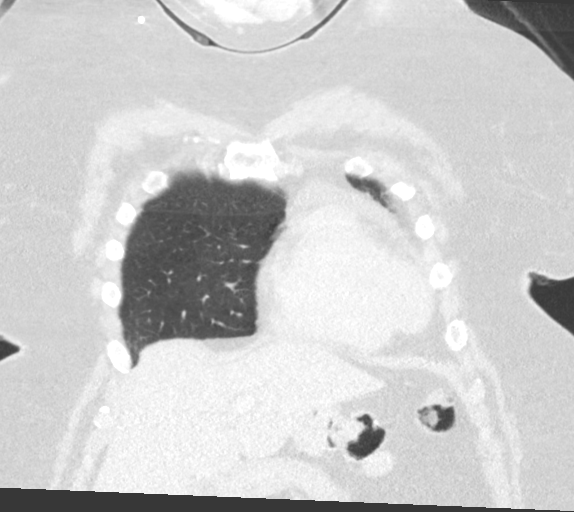
[im 54/134  lung]
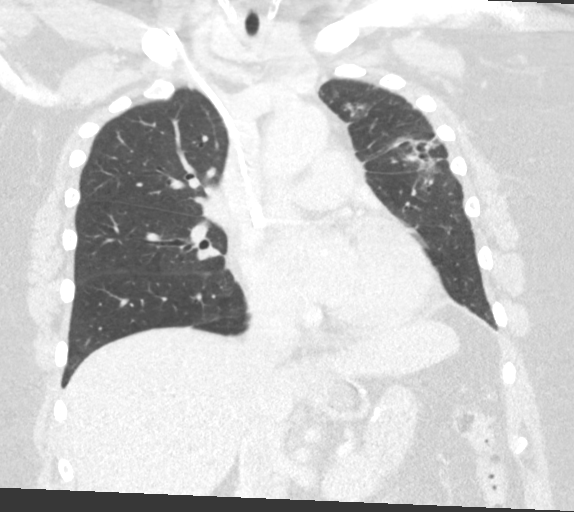
[im 80/134  lung]
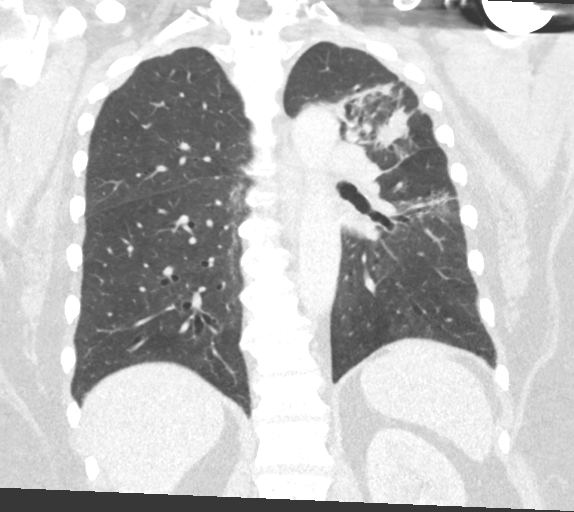

[15 of 36 positions shown; findings below may reference images not displayed]

FINDINGS: Cardiovascular: The heart size is mildly enlarged. Trace pericardial
effusion is decreased. Aortic atherosclerosis.

Mediastinum/Nodes: No enlarged mediastinal, hilar, or axillary lymph
nodes. Thyroid gland, trachea, and esophagus demonstrate no
significant findings.

Lungs/Pleura: Post treatment changes within the left upper lobe
identified including fibrosis, architectural distortion the treated
lung mass in the left upper lobe measures 2.6 x 2.0 cm, image 40/4.
Previously 3.4 x 2.4 cm. No new suspicious nodule or mass
identified.

Upper Abdomen: There are no acute findings identified within the
upper abdomen. Left lobe of liver cyst measures 1.5 cm and is
unchanged.

Musculoskeletal: Spondylosis within the thoracic spine. No
aggressive lytic or sclerotic bone lesion.
IMPRESSION: 1. Continued decrease in size of treated left upper lobe lung mass.
2. No new or progressive disease identified.
3. Aortic atherosclerosis.

Aortic Atherosclerosis ([RH]-[RH]).

## 2019-07-02 MED ORDER — IOHEXOL 300 MG/ML  SOLN
75.0000 mL | Freq: Once | INTRAMUSCULAR | Status: AC | PRN
  Administered 2019-07-02: 75 mL via INTRAVENOUS

## 2019-07-08 ENCOUNTER — Encounter (HOSPITAL_COMMUNITY): Payer: Self-pay | Admitting: Hematology

## 2019-07-08 ENCOUNTER — Other Ambulatory Visit: Payer: Self-pay

## 2019-07-08 ENCOUNTER — Inpatient Hospital Stay (HOSPITAL_BASED_OUTPATIENT_CLINIC_OR_DEPARTMENT_OTHER): Payer: Medicare Other | Admitting: Hematology

## 2019-07-08 DIAGNOSIS — I10 Essential (primary) hypertension: Secondary | ICD-10-CM | POA: Diagnosis not present

## 2019-07-08 DIAGNOSIS — C3492 Malignant neoplasm of unspecified part of left bronchus or lung: Secondary | ICD-10-CM

## 2019-07-08 DIAGNOSIS — D320 Benign neoplasm of cerebral meninges: Secondary | ICD-10-CM | POA: Diagnosis not present

## 2019-07-08 DIAGNOSIS — E119 Type 2 diabetes mellitus without complications: Secondary | ICD-10-CM | POA: Diagnosis not present

## 2019-07-08 NOTE — Progress Notes (Signed)
[address], [place] [id number]   CLINIC:  Medical Oncology/Hematology  PCP:  Iona Beard, MD [address] [place] [address] [phone]   REASON FOR VISIT:  Stage III left lung adenocarcinoma.  CURRENT THERAPY: Consolidation with durvalumab.    INTERVAL HISTORY:  Denise [name] 69 y.o. female seen for follow-up of radiation pneumonitis.  She is taking prednisone 20 mg daily.  Reports shortness of breath on exertion.  Energy levels are 50%.  Appetite is 100%.  Reports is ankle swellings.  No cough or hemoptysis reported.  REVIEW OF SYSTEMS:  Review of Systems  Respiratory: Positive for shortness of breath.   Cardiovascular: Positive for leg swelling.  All other systems reviewed and are negative.    PAST MEDICAL/SURGICAL HISTORY:  Past Medical History:  Diagnosis Date  . Anemia   . Arthritis   . Diabetes mellitus    diet controlled  . GERD (gastroesophageal reflux disease)   . HTN (hypertension)   . HTN (hypertension)   . Port-A-Cath in place 01/06/2019  . Shortness of breath dyspnea    with exertion   Past Surgical History:  Procedure Laterality Date  . CHOLECYSTECTOMY  1997  . COLONOSCOPY    . GASTRIC BYPASS    . INCISIONAL HERNIA REPAIR  04/11/11  . IR IMAGING GUIDED PORT INSERTION  12/27/2018  . LAPAROSCOPIC SALPINGOOPHERECTOMY    . LAPAROTOMY  04/11/2011   Procedure: EXPLORATORY LAPAROTOMY;  Surgeon: Joyice Faster. Cornett, MD;  Location: [facility];  Service: General;  Laterality: N/A;  closure port hole  . TOTAL HIP ARTHROPLASTY  03/07/2012   Procedure: TOTAL HIP ARTHROPLASTY ANTERIOR APPROACH;  Surgeon: Mauri Pole, MD;  Location: [facility];  Service: Orthopedics;  Laterality: Right;  . TOTAL SHOULDER ARTHROPLASTY Left 01/21/2015  . TOTAL SHOULDER ARTHROPLASTY Left 01/21/2015   Procedure: LEFT TOTAL SHOULDER ARTHROPLASTY;  Surgeon: Justice Britain, MD;  Location: [facility];  Service: Orthopedics;  Laterality: Left;  Marland Kitchen VAGINAL  HYSTERECTOMY       SOCIAL HISTORY:  Social History   Socioeconomic History  . Marital status: Single    Spouse name: Not on file  . Number of children: Not on file  . Years of education: 12th grade  . Highest education level: Not on file  Occupational History  . Occupation: Employed    Employer: [employer]  Tobacco Use  . Smoking status: Former Smoker    Packs/day: 1.00    Years: 12.00    Pack years: 12.00    Types: Cigarettes    Quit date: 03/20/1976    Years since quitting: 43.3  . Smokeless tobacco: Never Used  Substance and Sexual Activity  . Alcohol use: No  . Drug use: No  . Sexual activity: Never  Other Topics Concern  . Not on file  Social History Narrative  . Not on file   Social Determinants of Health   Financial Resource Strain: Low Risk   . Difficulty of Paying Living Expenses: Not very hard  Food Insecurity: No Food Insecurity  . Worried About Charity fundraiser in the Last Year: Never true  . Ran Out of Food in the Last Year: Never true  Transportation Needs: No Transportation Needs  . Lack of Transportation (Medical): No  . Lack of Transportation (Non-Medical): No  Physical Activity: Inactive  . Days of Exercise per Week: 0 days  . Minutes of Exercise per Session: 0 min  Stress: No Stress  Concern Present  . Feeling of Stress : Only a little  Social Connections: Somewhat Isolated  . Frequency of Communication with Friends and Family: More than three times a week  . Frequency of Social Gatherings with Friends and Family: Once a week  . Attends Religious Services: More than 4 times per year  . Active Member of Clubs or Organizations: No  . Attends Archivist Meetings: Never  . Marital Status: Never married  Intimate Partner Violence: Not At Risk  . Fear of Current or Ex-Partner: No  . Emotionally Abused: No  . Physically Abused: No  . Sexually Abused: No    FAMILY HISTORY:  Family History  Problem Relation Age of Onset   . Breast cancer Mother   . COPD Mother   . Arthritis Mother   . Diabetes Mother   . Hypertension Mother   . Hypertension Father   . Diabetes Father   . Breast cancer Sister   . Thyroid cancer Brother   . Huntington's disease Maternal Grandmother   . Heart attack Brother     CURRENT MEDICATIONS:  Outpatient Encounter Medications as of 07/08/2019  Medication Sig  . albuterol (VENTOLIN HFA) 108 (90 Base) MCG/ACT inhaler TAKE 2 PUFFS BY MOUTH EVERY 6 HOURS AS NEEDED FOR WHEEZE OR SHORTNESS OF BREATH  . ALPRAZolam (XANAX) 0.25 MG tablet Take 0.25 mg by mouth as needed.   Marland Kitchen amLODipine (NORVASC) 5 MG tablet Take 5 mg by mouth every morning.   . Calcium Carbonate Antacid (TUMS PO) Take 4 tablets by mouth daily.   . Cyanocobalamin (VITAMIN B-12) 2500 MCG SUBL Place 2,500 mcg under the tongue every morning.   . diphenhydrAMINE (BENADRYL) 25 MG tablet Take 25 mg by mouth every 6 (six) hours as needed.  Hunt Oris IV Inject into the vein every 14 (fourteen) days.  . ferrous sulfate 325 (65 FE) MG tablet Take 1 tablet (325 mg total) by mouth 3 (three) times daily after meals. (Patient taking differently: Take 325 mg by mouth daily. )  . gabapentin (NEURONTIN) 300 MG capsule Take 300 mg by mouth at bedtime as needed for pain.  Marland Kitchen ibuprofen (ADVIL,MOTRIN) 800 MG tablet Take 1 tablet (800 mg total) by mouth 3 (three) times daily.  . Multiple Vitamin (MULITIVITAMIN WITH MINERALS) TABS Take 1 tablet by mouth every morning.   . naproxen (NAPROSYN) 500 MG tablet Take 500 mg by mouth as needed.   Marland Kitchen omeprazole (PRILOSEC) 20 MG capsule Take 20 mg by mouth daily.  . predniSONE (DELTASONE) 20 MG tablet Take as directed  . [DISCONTINUED] prochlorperazine (COMPAZINE) 10 MG tablet Take 1 tablet (10 mg total) by mouth every 6 (six) hours as needed (Nausea or vomiting).   No facility-administered encounter medications on file as of 07/08/2019.    ALLERGIES:  No Known Allergies   PHYSICAL EXAM:  ECOG  Performance status: 0  Vitals:   07/08/19 1200  BP: 126/65  Pulse: 76  Resp: 18  Temp: (!) 96.9 F (36.1 C)  SpO2: 100%   Filed Weights   07/08/19 1200  Weight: 286 lb (129.7 kg)    Physical Exam Vitals reviewed.  Constitutional:      Appearance: Normal appearance.  Cardiovascular:     Rate and Rhythm: Normal rate and regular rhythm.     Heart sounds: Normal heart sounds.  Pulmonary:     Effort: Pulmonary effort is normal.     Breath sounds: Normal breath sounds.  Abdominal:     General:  There is no distension.     Palpations: Abdomen is soft. There is no mass.  Musculoskeletal:        General: Swelling present.  Lymphadenopathy:     Cervical: No cervical adenopathy.  Skin:    General: Skin is warm.  Neurological:     General: No focal deficit present.     Mental Status: She is alert and oriented to person, place, and time.  Psychiatric:        Mood and Affect: Mood normal.        Behavior: Behavior normal.      LABORATORY DATA:  I have reviewed the labs as listed.  CBC    Component Value Date/Time   WBC 10.1 07/01/2019 1356   RBC 4.60 07/01/2019 1356   HGB 12.3 07/01/2019 1356   HCT 41.7 07/01/2019 1356   PLT 341 07/01/2019 1356   MCV 90.7 07/01/2019 1356   MCH 26.7 07/01/2019 1356   MCHC 29.5 (L) 07/01/2019 1356   RDW 15.5 07/01/2019 1356   LYMPHSABS 0.6 (L) 07/01/2019 1356   MONOABS 0.4 07/01/2019 1356   EOSABS 0.1 07/01/2019 1356   BASOSABS 0.0 07/01/2019 1356   CMP Latest Ref Rng & Units 07/01/2019 06/16/2019 05/29/2019  Glucose 70 - 99 mg/dL 111(H) 161(H) 86  BUN 8 - 23 mg/dL 15 17 16   Creatinine 0.44 - 1.00 mg/dL 0.86 0.95 0.87  Sodium 135 - 145 mmol/L 142 142 142  Potassium 3.5 - 5.1 mmol/L 4.6 4.6 3.7  Chloride 98 - 111 mmol/L 106 105 107  CO2 22 - 32 mmol/L 27 27 24   Calcium 8.9 - 10.3 mg/dL 9.0 9.3 8.8(L)  Total Protein 6.5 - 8.1 g/dL 6.6 6.4(L) 6.8  Total Bilirubin 0.3 - 1.2 mg/dL 0.4 0.3 0.5  Alkaline Phos 38 - 126 U/L 97 97 91    AST 15 - 41 U/L 21 33 26  ALT 0 - 44 U/L 34 45(H) 36       DIAGNOSTIC IMAGING:  I have reviewed the scans.    ASSESSMENT & PLAN:   Adenocarcinoma of left lung ([place]) 1.  Radiation pneumonitis: -Prednisone 60 mg started on 05/19/2019, dose reduced to 40 mg daily on 05/29/2019, further reduced to 20 mg daily on 06/16/2019. -We reviewed results of CT chest from 07/02/2019.  Pneumonitis has improved.  Mass has also improved in the left upper lobe, measuring 2.6 x 2.0. -We will cut back her prednisone to 10 mg daily for 7 days followed by 5 mg daily for 7 days. -After that we will switch to 5 mg every other day for 10 days and stop it.  2.  Clinical stage IIIb (T3N2) adenocarcinoma of the lung: -Chemoradiation therapy with carboplatin and paclitaxel from 01/07/2019 through 02/11/2019. -Consolidation immunotherapy with durvalumab from 03/19/2019 through 04/16/2019. -CT scan chest from 07/02/2019 shows left upper lobe lung mass measuring 2.6 x 2.0 cm, showing improvement in size. -We will plan to start back on durvalumab in 2 weeks.  3.  Meningioma: -MRI of the brain on 02/18/2019 showed stable management of the greater wing of the sphenoid measuring 2.5 x 2 x 2.5 cm. -No headaches and left facial pain reported.   Orders placed this encounter:  No orders of the defined types were placed in this encounter.     Derek Jack, MD [place] [phone]

## 2019-07-08 NOTE — Assessment & Plan Note (Signed)
1.  Radiation pneumonitis: -Prednisone 60 mg started on 05/19/2019, dose reduced to 40 mg daily on 05/29/2019, further reduced to 20 mg daily on 06/16/2019. -We reviewed results of CT chest from 07/02/2019.  Pneumonitis has improved.  Mass has also improved in the left upper lobe, measuring 2.6 x 2.0. -We will cut back her prednisone to 10 mg daily for 7 days followed by 5 mg daily for 7 days. -After that we will switch to 5 mg every other day for 10 days and stop it.  2.  Clinical stage IIIb (T3N2) adenocarcinoma of the lung: -Chemoradiation therapy with carboplatin and paclitaxel from 01/07/2019 through 02/11/2019. -Consolidation immunotherapy with durvalumab from 03/19/2019 through 04/16/2019. -CT scan chest from 07/02/2019 shows left upper lobe lung mass measuring 2.6 x 2.0 cm, showing improvement in size. -We will plan to start back on durvalumab in 2 weeks.  3.  Meningioma: -MRI of the brain on 02/18/2019 showed stable management of the greater wing of the sphenoid measuring 2.5 x 2 x 2.5 cm. -No headaches and left facial pain reported.

## 2019-07-08 NOTE — Patient Instructions (Signed)
[place] at [facility] Discharge Instructions  You were seen today by Dr. Delton Coombes. He went over your recent lab and scan results. Decrease your prednisone to 1/2 tablet for 1 week and then decrease to 1/4 tablet for a week then stop them. He will restart your treatment once you have stopped the prednisone. He will see you back in 2 weeks for labs, treatment and follow up.   Thank you for choosing [place] at [facility] to provide your oncology and hematology care.  To afford each patient quality time with our provider, please arrive at least 15 minutes before your scheduled appointment time.   If you have a lab appointment with the [facility] please come in thru the  Main Entrance and check in at the main information desk  You need to re-schedule your appointment should you arrive 10 or more minutes late.  We strive to give you quality time with our providers, and arriving late affects you and other patients whose appointments are after yours.  Also, if you no show three or more times for appointments you may be dismissed from the clinic at the providers discretion.     Again, thank you for choosing [facility].  Our hope is that these requests will decrease the amount of time that you wait before being seen by our physicians.       _____________________________________________________________  Should you have questions after your visit to [facility], please contact our office at [phone] between the hours of 8:00 a.m. and 4:30 p.m.  Voicemails left after 4:00 p.m. will not be returned until the following business day.  For prescription refill requests, have your pharmacy contact our office and allow 72 hours.    Cancer Center Support Programs:   > Cancer Support Group  2nd Tuesday of the month 1pm-2pm, Journey Room

## 2019-07-14 DIAGNOSIS — I10 Essential (primary) hypertension: Secondary | ICD-10-CM | POA: Diagnosis not present

## 2019-07-14 DIAGNOSIS — R7303 Prediabetes: Secondary | ICD-10-CM | POA: Diagnosis not present

## 2019-07-14 DIAGNOSIS — C3490 Malignant neoplasm of unspecified part of unspecified bronchus or lung: Secondary | ICD-10-CM | POA: Diagnosis not present

## 2019-07-14 DIAGNOSIS — R0602 Shortness of breath: Secondary | ICD-10-CM | POA: Diagnosis not present

## 2019-07-14 DIAGNOSIS — M25562 Pain in left knee: Secondary | ICD-10-CM | POA: Diagnosis not present

## 2019-07-18 ENCOUNTER — Ambulatory Visit (HOSPITAL_COMMUNITY)
Admission: RE | Admit: 2019-07-18 | Discharge: 2019-07-18 | Disposition: A | Discharge status: Home / Self Care | Payer: Medicare Other | Source: Ambulatory Visit | Attending: Internal Medicine | Admitting: Internal Medicine

## 2019-07-18 ENCOUNTER — Other Ambulatory Visit: Payer: Self-pay

## 2019-07-18 DIAGNOSIS — D329 Benign neoplasm of meninges, unspecified: Secondary | ICD-10-CM | POA: Diagnosis not present

## 2019-07-18 DIAGNOSIS — C719 Malignant neoplasm of brain, unspecified: Secondary | ICD-10-CM | POA: Diagnosis not present

## 2019-07-18 IMAGING — MR MR HEAD WO/W CM
10 of 14 series · 30 of 48 positions shown · IV contrast (gadavist)
Comparison: [DATE] MRI head.

CLINICAL DATA: Brain/CNS neoplasm, assess treatment response.

EXAM:
MRI HEAD WITHOUT AND WITH CONTRAST
TECHNIQUE: Multiplanar, multiecho pulse sequences of the brain and surrounding
structures were obtained without and with intravenous contrast.
CONTRAST:  10mL GADAVIST GADOBUTROL 1 MMOL/ML IV SOLN

[Series 2: T1 · sagittal · 5.0mm · 0.41mm/px · 2 of 20 slices shown (1 of 2)]
[im 1/20]
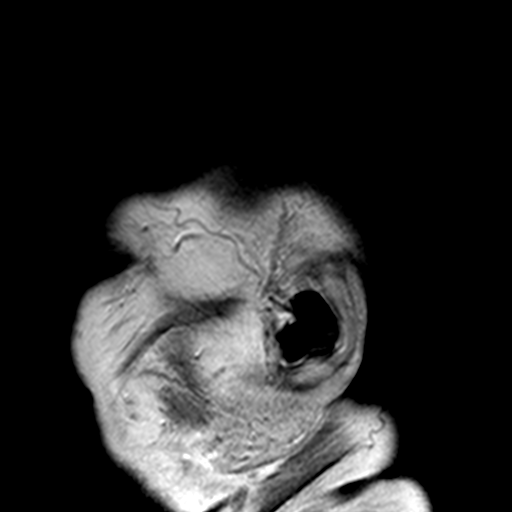
[im 20/20]
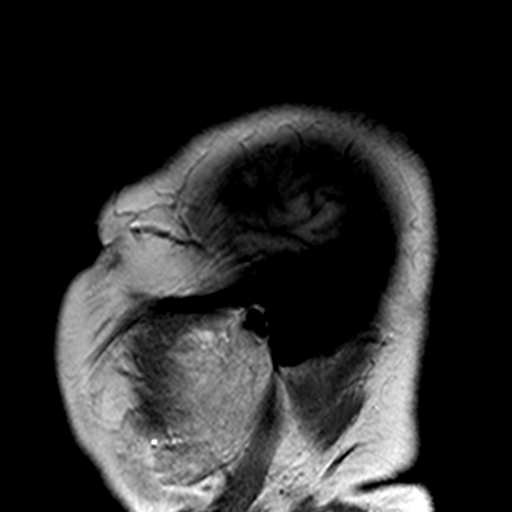

[Series 3: DWI · axial · 3.0mm · 0.82mm/px · z∈[+13,+175]mm · 4 of 55 slices shown (1 of 2)]
[im 1/55]
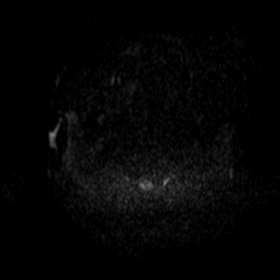
[im 19/55]
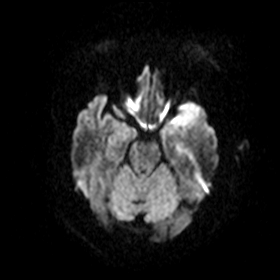
[im 37/55]
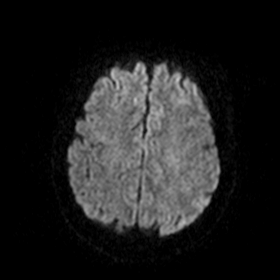
[im 55/55]
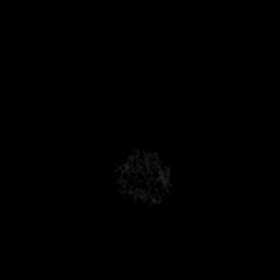

[Series 5: T1 · sagittal · 5.0mm · 0.41mm/px · 1 of 20 slices shown (2 of 2)]
[im 1/20]
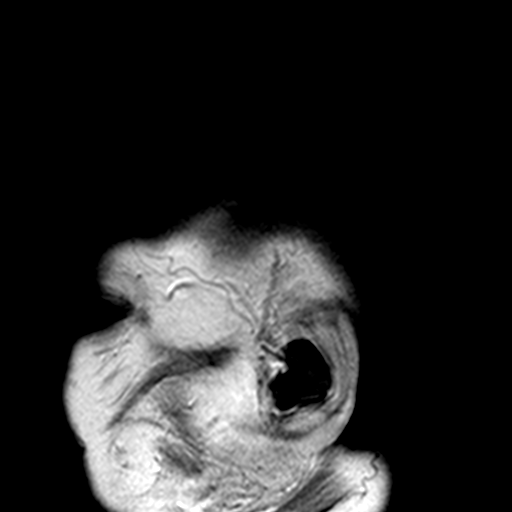

[Series 6: DWI · coronal · 5.0mm · 0.54mm/px · 3 of 34 slices shown (2 of 2)]
[im 1/34]
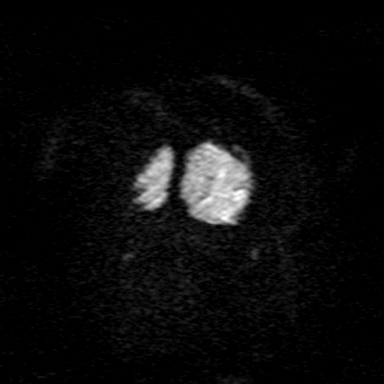
[im 17/34]
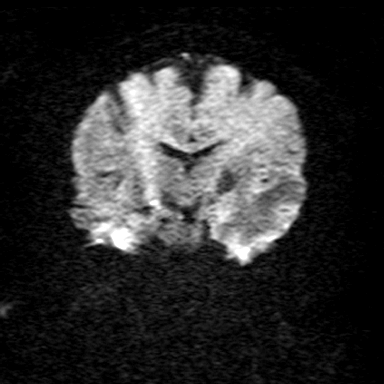
[im 34/34]
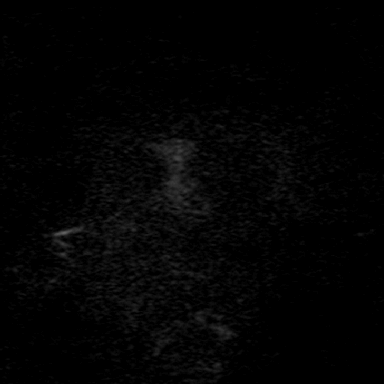

[Series 8: T2 · axial · 5.0mm · 0.75mm/px · z∈[+23,+166]mm · 2 of 23 slices shown (1 of 3)]
[im 1/23]
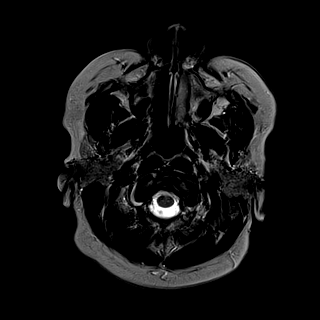
[im 23/23]
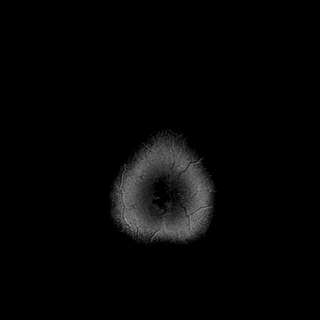

[Series 9: FLAIR · axial · 3.0mm · 0.94mm/px · z∈[+25,+163]mm · 4 of 47 slices shown]
[im 1/47]
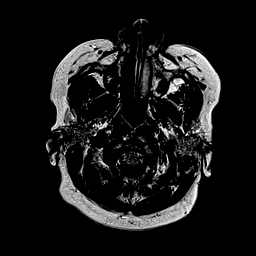
[im 16/47]
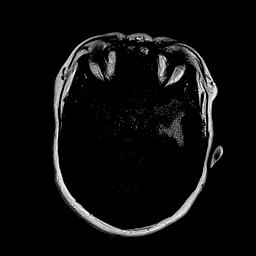
[im 31/47]
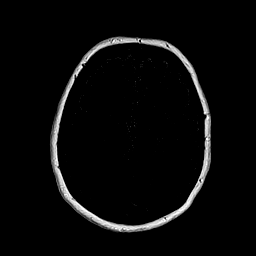
[im 47/47]
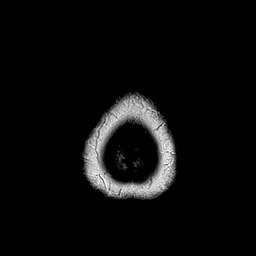

[Series 10: T2 · axial · 5.0mm · 0.45mm/px · z∈[+29,+159]mm · 2 of 21 slices shown (2 of 3)]
[im 1/21]
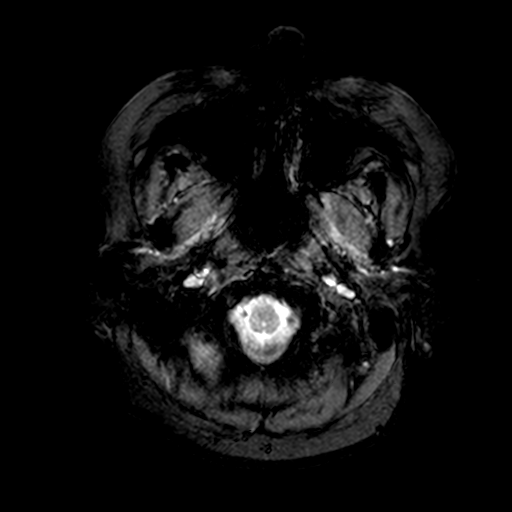
[im 21/21]
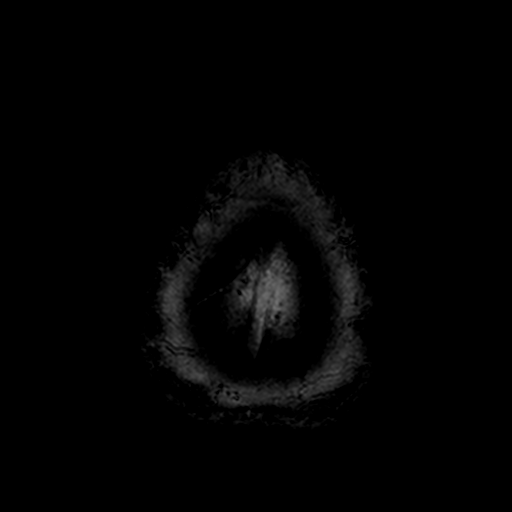

[Series 12: T2 · coronal · 5.0mm · 0.75mm/px · 2 of 28 slices shown (3 of 3)]
[im 1/28]
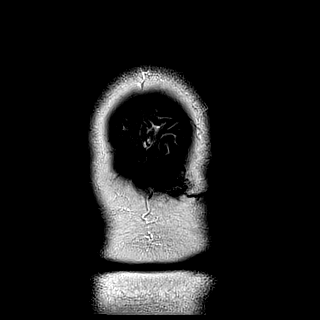
[im 28/28]
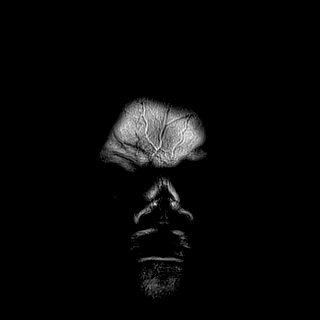

[Series 13: T1 post-contrast · axial · 2.0mm · 0.47mm/px · z∈[-10,+196]mm · 8 of 104 slices shown (1 of 2)]
[im 1/104]
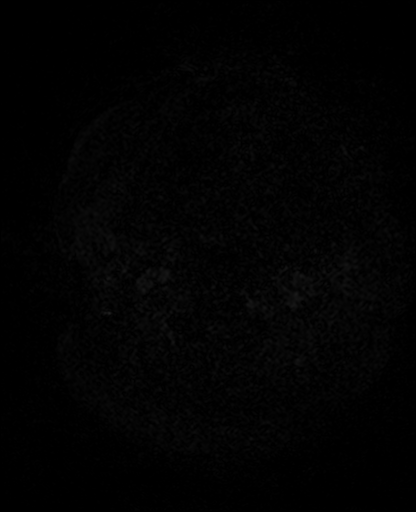
[im 15/104]
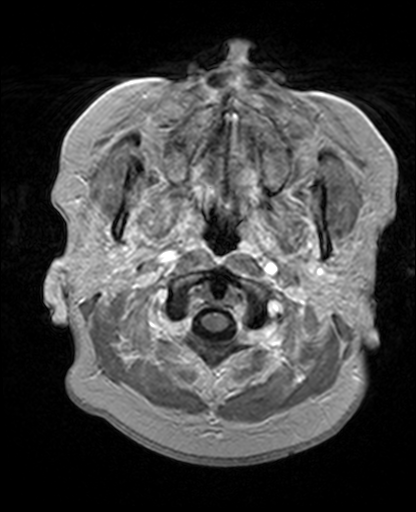
[im 30/104]
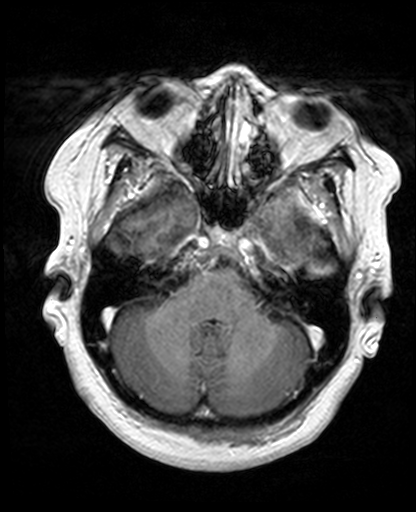
[im 45/104]
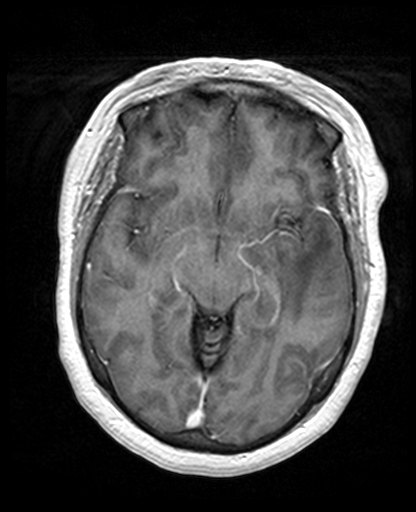
[im 59/104]
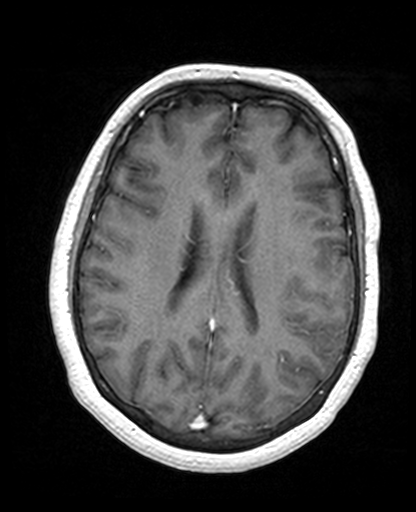
[im 74/104]
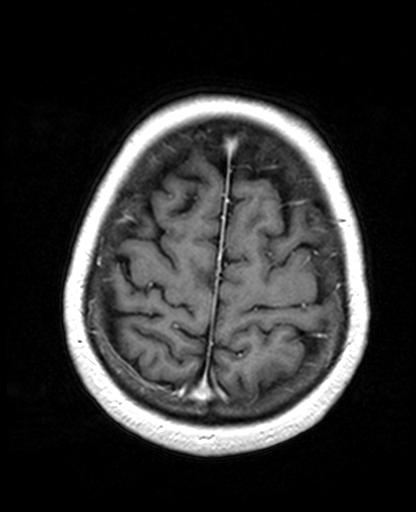
[im 89/104]
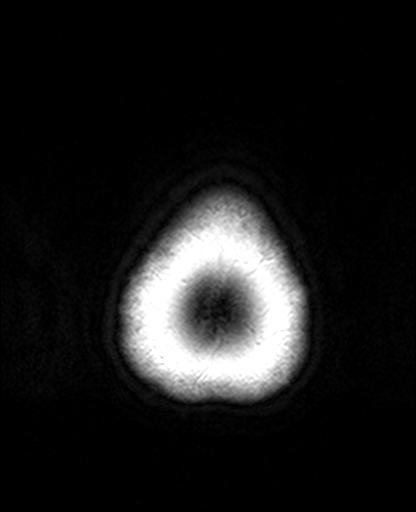
[im 104/104]
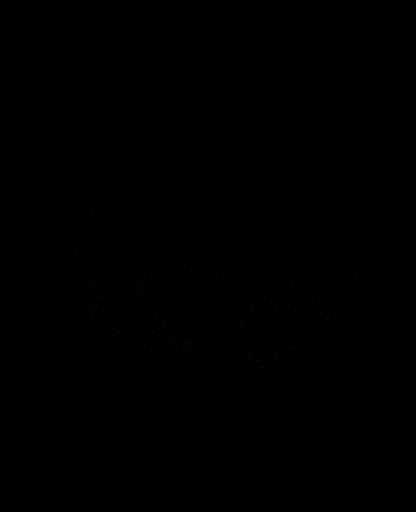

[Series 14: T1 post-contrast · coronal · 5.0mm · 0.49mm/px · 2 of 24 slices shown (2 of 2)]
[im 1/24]
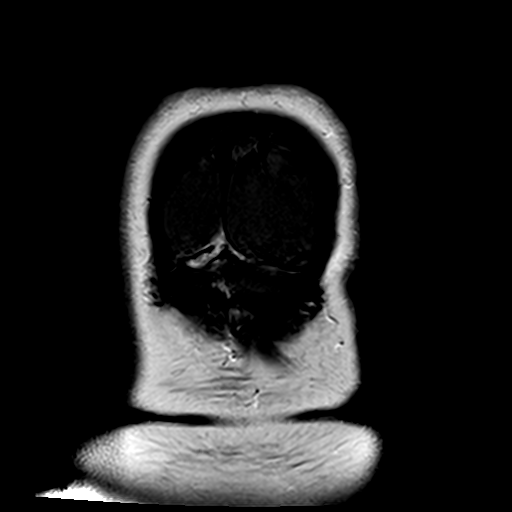
[im 24/24]
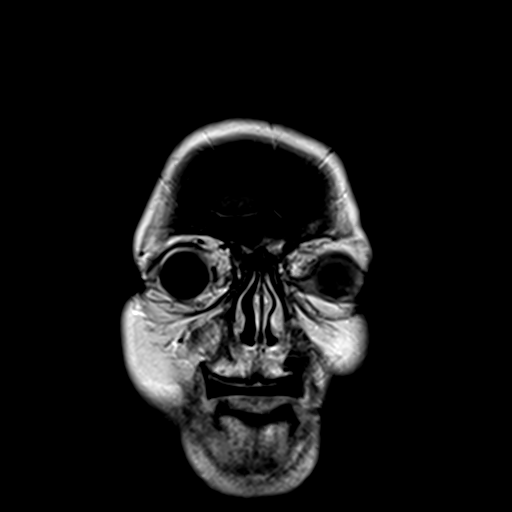

[30 of 48 positions shown; findings below may reference images not displayed]

FINDINGS: Brain: Left sphenoid wing meningioma measuring 2.6 x 2.0 x 2.6 cm is
unchanged ([DATE], [DATE], previously 2.5 x 2.0 x 2.5 cm). Mild mass
effect upon the subjacent anterior temporal lobe is unchanged.
Confluent T2/FLAIR hyperintense left temporal white matter signal is
increased. No new enhancing foci.

No midline shift, ventriculomegaly or extra-axial fluid collection.
No acute infarct or intracranial hemorrhage.

Vascular: Normal flow voids.

Skull and upper cervical spine: Normal marrow signal.

Sinuses/Orbits: Normal orbits. Clear paranasal sinuses. No mastoid
effusion.

Other: None.
IMPRESSION: Unchanged size of left sphenoid wing meningioma. No new enhancing
intracranial lesions.

Increased left temporal white matter edema.

## 2019-07-18 MED ORDER — GADOBUTROL 1 MMOL/ML IV SOLN
10.0000 mL | Freq: Once | INTRAVENOUS | Status: AC | PRN
  Administered 2019-07-18: 10 mL via INTRAVENOUS

## 2019-07-22 ENCOUNTER — Inpatient Hospital Stay: Payer: Medicare Other | Attending: Internal Medicine | Admitting: Internal Medicine

## 2019-07-22 ENCOUNTER — Other Ambulatory Visit: Payer: Self-pay

## 2019-07-22 VITALS — BP 124/64 | HR 91 | Temp 98.3°F | Resp 19 | Ht 62.5 in | Wt 287.9 lb

## 2019-07-22 DIAGNOSIS — C3492 Malignant neoplasm of unspecified part of left bronchus or lung: Secondary | ICD-10-CM | POA: Insufficient documentation

## 2019-07-22 DIAGNOSIS — D329 Benign neoplasm of meninges, unspecified: Secondary | ICD-10-CM

## 2019-07-22 DIAGNOSIS — D320 Benign neoplasm of cerebral meninges: Secondary | ICD-10-CM | POA: Insufficient documentation

## 2019-07-22 NOTE — Progress Notes (Signed)
[facility] at [address], [place] [address] [phone]   Interval Evaluation  Date of Service: 07/22/19 Patient Name: Denise [name] Patient MRN: [id number] Patient DOB: [birth year] Provider: Ventura Sellers, MD  Identifying Statement:  Denise [name] is a 69 y.o. female with left temporal meningioma   Oncologic History: Oncology History  Adenocarcinoma of left lung ([place])  12/09/2018 Initial Diagnosis   Adenocarcinoma of left lung ([place])   01/02/2019 Cancer Staging   Staging form: Lung, AJCC 8th Edition - Clinical stage from 01/02/2019: Stage IIIB (cT3, cN2, cM0) - Signed by Derek Jack, MD on 01/02/2019   01/07/2019 - 02/17/2019 Chemotherapy   The patient had palonosetron (ALOXI) injection 0.25 mg, 0.25 mg, Intravenous,  Once, 6 of 6 cycles Administration: 0.25 mg (01/07/2019), 0.25 mg (01/14/2019), 0.25 mg (01/21/2019), 0.25 mg (01/28/2019), 0.25 mg (02/04/2019), 0.25 mg (02/11/2019) CARBOplatin (PARAPLATIN) 270 mg in sodium chloride 0.9 % 250 mL chemo infusion, 270 mg (100 % of original dose 266.4 mg), Intravenous,  Once, 6 of 6 cycles Dose modification:   (original dose 266.4 mg, Cycle 1),   (original dose 266.4 mg, Cycle 2),   (original dose 266.4 mg, Cycle 3),   (original dose 266.4 mg, Cycle 4) Administration: 270 mg (01/07/2019), 270 mg (01/14/2019), 270 mg (01/21/2019), 270 mg (01/28/2019), 270 mg (02/04/2019), 270 mg (02/11/2019) PACLitaxel (TAXOL) 108 mg in sodium chloride 0.9 % 250 mL chemo infusion (</= 80mg /m2), 45 mg/m2 = 108 mg, Intravenous,  Once, 6 of 6 cycles Administration: 108 mg (01/07/2019), 108 mg (01/14/2019), 108 mg (01/21/2019), 108 mg (01/28/2019), 108 mg (02/04/2019), 108 mg (02/11/2019) fosaprepitant (EMEND) 150 mg, dexamethasone (DECADRON) 12 mg in sodium chloride 0.9 % 145 mL IVPB, , Intravenous,  Once, 5 of 5 cycles Administration:  (01/14/2019),  (01/21/2019),  (01/28/2019),  (02/04/2019),   (02/11/2019)  for chemotherapy treatment.    03/19/2019 -  Chemotherapy   The patient had durvalumab (IMFINZI) 1,240 mg in sodium chloride 0.9 % 100 mL chemo infusion, 9.9 mg/kg = 1,260 mg, Intravenous,  Once, 3 of 6 cycles Administration: 1,240 mg (03/19/2019), 1,240 mg (04/02/2019), 1,240 mg (04/16/2019)  for chemotherapy treatment.      Interval History:  Denise [name] presents to clinic today to review recent MRI brain.  The meningioma was uncovered in September 2020 after undergoing staging brain MRI for lung cancer.  She denies any neurologic symptoms or any history of neurologic symptoms.  She is currently undergoing Ifimzi consolidation for [facility] lung cancer with Dr. Delton Coombes at [facility], following completion of chemoradiotherapy.  Medications: Current Outpatient Medications on File Prior to Visit  Medication Sig Dispense Refill  . albuterol (VENTOLIN HFA) 108 (90 Base) MCG/ACT inhaler TAKE 2 PUFFS BY MOUTH EVERY 6 HOURS AS NEEDED FOR WHEEZE OR SHORTNESS OF BREATH 8 g 6  . ALPRAZolam (XANAX) 0.25 MG tablet Take 0.25 mg by mouth as needed.     Marland Kitchen amLODipine (NORVASC) 5 MG tablet Take 5 mg by mouth every morning.     . Calcium Carbonate Antacid (TUMS PO) Take 4 tablets by mouth daily.     . Cyanocobalamin (VITAMIN B-12) 2500 MCG SUBL Place 2,500 mcg under the tongue every morning.     . diphenhydrAMINE (BENADRYL) 25 MG tablet Take 25 mg by mouth every 6 (six) hours as needed.    Denise [name] IV Inject into the vein every 14 (fourteen) days.    . ferrous sulfate 325 (65 FE) MG tablet Take 1  tablet (325 mg total) by mouth 3 (three) times daily after meals. (Patient taking differently: Take 325 mg by mouth daily. )    . gabapentin (NEURONTIN) 300 MG capsule Take 300 mg by mouth at bedtime as needed for pain.    Marland Kitchen ibuprofen (ADVIL,MOTRIN) 800 MG tablet Take 1 tablet (800 mg total) by mouth 3 (three) times daily. 90 tablet 2  . Multiple Vitamin (MULITIVITAMIN WITH MINERALS) TABS Take 1 tablet  by mouth every morning.     . naproxen (NAPROSYN) 500 MG tablet Take 500 mg by mouth as needed.     Marland Kitchen omeprazole (PRILOSEC) 20 MG capsule Take 20 mg by mouth daily.    . predniSONE (DELTASONE) 20 MG tablet Take as directed 100 tablet 0  . [DISCONTINUED] prochlorperazine (COMPAZINE) 10 MG tablet Take 1 tablet (10 mg total) by mouth every 6 (six) hours as needed (Nausea or vomiting). 30 tablet 1   No current facility-administered medications on file prior to visit.    Allergies: No Known Allergies Past Medical History:  Past Medical History:  Diagnosis Date  . Anemia   . Arthritis   . Diabetes mellitus    diet controlled  . GERD (gastroesophageal reflux disease)   . HTN (hypertension)   . HTN (hypertension)   . Port-A-Cath in place 01/06/2019  . Shortness of breath dyspnea    with exertion   Past Surgical History:  Past Surgical History:  Procedure Laterality Date  . CHOLECYSTECTOMY  1997  . COLONOSCOPY    . GASTRIC BYPASS    . INCISIONAL HERNIA REPAIR  04/11/11  . IR IMAGING GUIDED PORT INSERTION  12/27/2018  . LAPAROSCOPIC SALPINGOOPHERECTOMY    . LAPAROTOMY  04/11/2011   Procedure: EXPLORATORY LAPAROTOMY;  Surgeon: Joyice Faster. Cornett, MD;  Location: [facility];  Service: General;  Laterality: N/A;  closure port hole  . TOTAL HIP ARTHROPLASTY  03/07/2012   Procedure: TOTAL HIP ARTHROPLASTY ANTERIOR APPROACH;  Surgeon: Mauri Pole, MD;  Location: [facility];  Service: Orthopedics;  Laterality: Right;  . TOTAL SHOULDER ARTHROPLASTY Left 01/21/2015  . TOTAL SHOULDER ARTHROPLASTY Left 01/21/2015   Procedure: LEFT TOTAL SHOULDER ARTHROPLASTY;  Surgeon: Justice Britain, MD;  Location: [place];  Service: Orthopedics;  Laterality: Left;  Marland Kitchen VAGINAL HYSTERECTOMY     Social History:  Social History   Socioeconomic History  . Marital status: Single    Spouse name: Not on file  . Number of children: Not on file  . Years of education: 12th grade  . Highest education level: Not on file   Occupational History  . Occupation: Employed    Employer: [employer]  Tobacco Use  . Smoking status: Former Smoker    Packs/day: 1.00    Years: 12.00    Pack years: 12.00    Types: Cigarettes    Quit date: 03/20/1976    Years since quitting: 43.3  . Smokeless tobacco: Never Used  Substance and Sexual Activity  . Alcohol use: No  . Drug use: No  . Sexual activity: Never  Other Topics Concern  . Not on file  Social History Narrative  . Not on file   Social Determinants of Health   Financial Resource Strain: Low Risk   . Difficulty of Paying Living Expenses: Not very hard  Food Insecurity: No Food Insecurity  . Worried About Charity fundraiser in the Last Year: Never true  . Ran Out of Food in the Last Year: Never true  Transportation Needs: No  Transportation Needs  . Lack of Transportation (Medical): No  . Lack of Transportation (Non-Medical): No  Physical Activity: Inactive  . Days of Exercise per Week: 0 days  . Minutes of Exercise per Session: 0 min  Stress: No Stress Concern Present  . Feeling of Stress : Only a little  Social Connections: Somewhat Isolated  . Frequency of Communication with Friends and Family: More than three times a week  . Frequency of Social Gatherings with Friends and Family: Once a week  . Attends Religious Services: More than 4 times per year  . Active Member of Clubs or Organizations: No  . Attends Archivist Meetings: Never  . Marital Status: Never married  Intimate Partner Violence: Not At Risk  . Fear of Current or Ex-Partner: No  . Emotionally Abused: No  . Physically Abused: No  . Sexually Abused: No   Family History:  Family History  Problem Relation Age of Onset  . Breast cancer Mother   . COPD Mother   . Arthritis Mother   . Diabetes Mother   . Hypertension Mother   . Hypertension Father   . Diabetes Father   . Breast cancer Sister   . Thyroid cancer Brother   . Huntington's disease Maternal  Grandmother   . Heart attack Brother     Review of Systems: Constitutional: Denies fevers, chills or abnormal weight loss Eyes: Denies blurriness of vision Ears, nose, mouth, throat, and face: Denies mucositis or sore throat Respiratory: Denies cough, dyspnea or wheezes Cardiovascular: Denies palpitation, chest discomfort or lower extremity swelling Gastrointestinal:  Denies nausea, constipation, diarrhea GU: Denies dysuria or incontinence Skin: Denies abnormal skin rashes Neurological: Per HPI Musculoskeletal: Denies joint pain, back or neck discomfort. No decrease in ROM Behavioral/Psych: Denies anxiety, disturbance in thought content, and mood instability  Physical Exam: Vitals:   07/22/19 1003  BP: 124/64  Pulse: 91  Resp: 19  Temp: 98.3 F (36.8 C)  SpO2: 98%   KPS: 90. General: Alert, cooperative, pleasant, in no acute distress Head: Normal EENT: No conjunctival injection or scleral icterus. Oral mucosa moist Lungs: Resp effort normal Cardiac: Regular rate and rhythm Abdomen: Soft, non-distended abdomen Skin: No rashes cyanosis or petechiae. Extremities: No clubbing or edema  Neurologic Exam: Mental Status: Awake, alert, attentive to examiner. Oriented to self and environment. Language is fluent with intact comprehension.  Cranial Nerves: Visual acuity is grossly normal. Visual fields are full. Extra-ocular movements intact. No ptosis. Face is symmetric, tongue midline. Motor: Tone and bulk are normal. Power is full in both arms and legs. Reflexes are symmetric, no pathologic reflexes present. Intact finger to nose bilaterally Sensory: Intact to light touch and temperature Gait: Normal and tandem gait is deferred.   Labs: I have reviewed the data as listed    Component Value Date/Time   NA 142 07/01/2019 1356   K 4.6 07/01/2019 1356   CL 106 07/01/2019 1356   CO2 27 07/01/2019 1356   GLUCOSE 111 (H) 07/01/2019 1356   BUN 15 07/01/2019 1356   CREATININE  0.86 07/01/2019 1356   CALCIUM 9.0 07/01/2019 1356   PROT 6.6 07/01/2019 1356   ALBUMIN 3.6 07/01/2019 1356   AST 21 07/01/2019 1356   ALT 34 07/01/2019 1356   ALKPHOS 97 07/01/2019 1356   BILITOT 0.4 07/01/2019 1356   GFRNONAA >60 07/01/2019 1356   GFRAA >60 07/01/2019 1356   Lab Results  Component Value Date   WBC 10.1 07/01/2019   NEUTROABS 9.0 (H)  07/01/2019   HGB 12.3 07/01/2019   HCT 41.7 07/01/2019   MCV 90.7 07/01/2019   PLT 341 07/01/2019    Imaging: [place] Clinician Interpretation: I have personally reviewed the CNS images as listed.  My interpretation, in the context of the patient's clinical presentation, is stable disease  CT Chest W Contrast  Result Date: 07/03/2019 CLINICAL DATA:  Restaging adenocarcinoma of the left lung. EXAM: CT CHEST WITH CONTRAST TECHNIQUE: Multidetector CT imaging of the chest was performed during intravenous contrast administration. CONTRAST:  35mL OMNIPAQUE IOHEXOL 300 MG/ML  SOLN COMPARISON:  05/15/2019 FINDINGS: Cardiovascular: The heart size is mildly enlarged. Trace pericardial effusion is decreased. Aortic atherosclerosis. Mediastinum/Nodes: No enlarged mediastinal, hilar, or axillary lymph nodes. Thyroid gland, trachea, and esophagus demonstrate no significant findings. Lungs/Pleura: Post treatment changes within the left upper lobe identified including fibrosis, architectural distortion the treated lung mass in the left upper lobe measures 2.6 x 2.0 cm, image 40/4. Previously 3.4 x 2.4 cm. No new suspicious nodule or mass identified. Upper Abdomen: There are no acute findings identified within the upper abdomen. Left lobe of liver cyst measures 1.5 cm and is unchanged. Musculoskeletal: Spondylosis within the thoracic spine. No aggressive lytic or sclerotic bone lesion. IMPRESSION: 1. Continued decrease in size of treated left upper lobe lung mass. 2. No new or progressive disease identified. 3. Aortic atherosclerosis. Aortic Atherosclerosis  (ICD10-I70.0). Electronically Signed   By: Kerby Moors M.D.   On: 07/03/2019 09:55   MR Brain W Wo Contrast  Result Date: 07/19/2019 CLINICAL DATA:  Brain/CNS neoplasm, assess treatment response. EXAM: MRI HEAD WITHOUT AND WITH CONTRAST TECHNIQUE: Multiplanar, multiecho pulse sequences of the brain and surrounding structures were obtained without and with intravenous contrast. CONTRAST:  57mL GADAVIST GADOBUTROL 1 MMOL/ML IV SOLN COMPARISON:  02/18/2019 MRI head. FINDINGS: Brain: Left sphenoid wing meningioma measuring 2.6 x 2.0 x 2.6 cm is unchanged (13:37, 14:15, previously 2.5 x 2.0 x 2.5 cm). Mild mass effect upon the subjacent anterior temporal lobe is unchanged. Confluent T2/FLAIR hyperintense left temporal white matter signal is increased. No new enhancing foci. No midline shift, ventriculomegaly or extra-axial fluid collection. No acute infarct or intracranial hemorrhage. Vascular: Normal flow voids. Skull and upper cervical spine: Normal marrow signal. Sinuses/Orbits: Normal orbits. Clear paranasal sinuses. No mastoid effusion. Other: None. IMPRESSION: Unchanged size of left sphenoid wing meningioma. No new enhancing intracranial lesions. Increased left temporal white matter edema. Electronically Signed   By: Primitivo Gauze M.D.   On: 07/19/2019 11:05    Assessment/Plan Meningioma ([place]) [D32.9]   Meckenzie Balsley is clinically and radiographically stable today.  Given lack of symptoms and relative lack of dimensional growth, at this time we recommend continued imaging surveillance.  We recommend she return to clinic in 9 months following MRI brain for evaluation.  Her case will also be reviewed and discussed in brain/spine tumor board.  All questions were answered. The patient knows to call the clinic with any problems, questions or concerns. No barriers to learning were detected.  The total time spent in the encounter was 30 minutes and more than 50% was on counseling and review of test  results   Ventura Sellers, MD Medical Director of Neuro-Oncology [facility] at [place] 07/22/19 9:47 AM

## 2019-07-23 ENCOUNTER — Telehealth: Payer: Self-pay | Admitting: Internal Medicine

## 2019-07-23 NOTE — Telephone Encounter (Signed)
Scheduled appt per 5/4 los.  Spoke with pt and she is aware of the appt date and time.

## 2019-07-24 ENCOUNTER — Inpatient Hospital Stay (HOSPITAL_COMMUNITY): Payer: Medicare Other

## 2019-07-24 ENCOUNTER — Inpatient Hospital Stay (HOSPITAL_COMMUNITY): Payer: Medicare Other | Attending: Hematology

## 2019-07-24 ENCOUNTER — Encounter (HOSPITAL_COMMUNITY): Payer: Self-pay | Admitting: Hematology

## 2019-07-24 ENCOUNTER — Other Ambulatory Visit: Payer: Self-pay

## 2019-07-24 ENCOUNTER — Inpatient Hospital Stay (HOSPITAL_BASED_OUTPATIENT_CLINIC_OR_DEPARTMENT_OTHER): Payer: Medicare Other | Admitting: Hematology

## 2019-07-24 VITALS — BP 129/59 | HR 100 | Temp 97.4°F | Resp 18

## 2019-07-24 DIAGNOSIS — Z95828 Presence of other vascular implants and grafts: Secondary | ICD-10-CM

## 2019-07-24 DIAGNOSIS — C3412 Malignant neoplasm of upper lobe, left bronchus or lung: Secondary | ICD-10-CM | POA: Insufficient documentation

## 2019-07-24 DIAGNOSIS — I10 Essential (primary) hypertension: Secondary | ICD-10-CM | POA: Diagnosis not present

## 2019-07-24 DIAGNOSIS — C3492 Malignant neoplasm of unspecified part of left bronchus or lung: Secondary | ICD-10-CM

## 2019-07-24 DIAGNOSIS — Z5112 Encounter for antineoplastic immunotherapy: Secondary | ICD-10-CM | POA: Insufficient documentation

## 2019-07-24 DIAGNOSIS — E119 Type 2 diabetes mellitus without complications: Secondary | ICD-10-CM | POA: Diagnosis not present

## 2019-07-24 DIAGNOSIS — D329 Benign neoplasm of meninges, unspecified: Secondary | ICD-10-CM | POA: Insufficient documentation

## 2019-07-24 DIAGNOSIS — Z79899 Other long term (current) drug therapy: Secondary | ICD-10-CM | POA: Diagnosis not present

## 2019-07-24 DIAGNOSIS — R0602 Shortness of breath: Secondary | ICD-10-CM | POA: Insufficient documentation

## 2019-07-24 LAB — COMPREHENSIVE METABOLIC PANEL
ALT: 18 U/L (ref 0–44)
AST: 18 U/L (ref 15–41)
Albumin: 3.3 g/dL — ABNORMAL LOW (ref 3.5–5.0)
Alkaline Phosphatase: 79 U/L (ref 38–126)
Anion gap: 8 (ref 5–15)
BUN: 12 mg/dL (ref 8–23)
CO2: 25 mmol/L (ref 22–32)
Calcium: 8.7 mg/dL — ABNORMAL LOW (ref 8.9–10.3)
Chloride: 110 mmol/L (ref 98–111)
Creatinine, Ser: 0.75 mg/dL (ref 0.44–1.00)
GFR calc Af Amer: >60 mL/min (ref >60)
GFR calc non Af Amer: >60 mL/min (ref >60)
Glucose, Bld: 107 mg/dL — ABNORMAL HIGH (ref 70–99)
Potassium: 3.9 mmol/L (ref 3.5–5.1)
Sodium: 143 mmol/L (ref 135–145)
Total Bilirubin: 0.4 mg/dL (ref 0.3–1.2)
Total Protein: 6.2 g/dL — ABNORMAL LOW (ref 6.5–8.1)

## 2019-07-24 LAB — CBC WITH DIFFERENTIAL/PLATELET
Abs Immature Granulocytes: 0.02 10*3/uL (ref 0.00–0.07)
Basophils Absolute: 0 10*3/uL (ref 0.0–0.1)
Basophils Relative: 0 %
Eosinophils Absolute: 0.2 10*3/uL (ref 0.0–0.5)
Eosinophils Relative: 3 %
HCT: 37.5 % (ref 36.0–46.0)
Hemoglobin: 11.5 g/dL — ABNORMAL LOW (ref 12.0–15.0)
Immature Granulocytes: 0 %
Lymphocytes Relative: 11 %
Lymphs Abs: 0.6 10*3/uL — ABNORMAL LOW (ref 0.7–4.0)
MCH: 26.9 pg (ref 26.0–34.0)
MCHC: 30.7 g/dL (ref 30.0–36.0)
MCV: 87.8 fL (ref 80.0–100.0)
Monocytes Absolute: 0.4 10*3/uL (ref 0.1–1.0)
Monocytes Relative: 8 %
Neutro Abs: 3.9 10*3/uL (ref 1.7–7.7)
Neutrophils Relative %: 78 %
Platelets: 323 10*3/uL (ref 150–400)
RBC: 4.27 MIL/uL (ref 3.87–5.11)
RDW: 15.9 % — ABNORMAL HIGH (ref 11.5–15.5)
WBC: 5.1 10*3/uL (ref 4.0–10.5)
nRBC: 0 % (ref 0.0–0.2)

## 2019-07-24 LAB — TSH: TSH: 1.174 u[IU]/mL (ref 0.350–4.500)

## 2019-07-24 MED ORDER — SODIUM CHLORIDE 0.9% FLUSH
10.0000 mL | INTRAVENOUS | Status: DC | PRN
  Administered 2019-07-24: 10 mL

## 2019-07-24 MED ORDER — SODIUM CHLORIDE 0.9 % IV SOLN
10.0000 mg/kg | Freq: Once | INTRAVENOUS | Status: AC
  Administered 2019-07-24: 1240 mg via INTRAVENOUS
  Filled 2019-07-24: qty 20

## 2019-07-24 MED ORDER — SODIUM CHLORIDE 0.9 % IV SOLN: Freq: Once | INTRAVENOUS | Status: AC

## 2019-07-24 MED ORDER — HEPARIN SOD (PORK) LOCK FLUSH 100 UNIT/ML IV SOLN
500.0000 [IU] | Freq: Once | INTRAVENOUS | Status: AC | PRN
  Administered 2019-07-24: 500 [IU]

## 2019-07-24 MED ORDER — SODIUM CHLORIDE 0.9% FLUSH
10.0000 mL | INTRAVENOUS | Status: DC | PRN
  Administered 2019-07-24: 10 mL via INTRAVENOUS

## 2019-07-24 NOTE — Progress Notes (Signed)
Patient has been assessed, vital signs and labs have been reviewed by Dr. Katragadda. ANC, Creatinine, LFTs, and Platelets are within treatment parameters per Dr. Katragadda. The patient is good to proceed with treatment at this time.  

## 2019-07-24 NOTE — Patient Instructions (Signed)
[facility] Discharge Instructions for Patients Receiving Chemotherapy  Today you received the following chemotherapy agents   To help prevent nausea and vomiting after your treatment, we encourage you to take your nausea medication   If you develop nausea and vomiting that is not controlled by your nausea medication, call the clinic.   BELOW ARE SYMPTOMS THAT SHOULD BE REPORTED IMMEDIATELY:  *FEVER GREATER THAN 100.5 F  *CHILLS WITH OR WITHOUT FEVER  NAUSEA AND VOMITING THAT IS NOT CONTROLLED WITH YOUR NAUSEA MEDICATION  *UNUSUAL SHORTNESS OF BREATH  *UNUSUAL BRUISING OR BLEEDING  TENDERNESS IN MOUTH AND THROAT WITH OR WITHOUT PRESENCE OF ULCERS  *URINARY PROBLEMS  *BOWEL PROBLEMS  UNUSUAL RASH Items with * indicate a potential emergency and should be followed up as soon as possible.  Feel free to call the clinic should you have any questions or concerns. The clinic phone number is [phone].  Please show the CHEMO ALERT CARD at check-in to the Emergency Department and triage nurse.   

## 2019-07-24 NOTE — Progress Notes (Signed)
Patient presents today for treatment and follow visit with Dr. Delton Coombes. MAR reviewed. Patient has no complaints of any changes since her last visit. Patient denies pain today. Patient has complaints of shortness of breath that has been ongoing.   Treatment given today per MD orders. Tolerated infusion without adverse affects. Vital signs stable. No complaints at this time. Discharged from clinic ambulatory. F/U with [facility] as scheduled.

## 2019-07-24 NOTE — Progress Notes (Signed)
Denise [name], [place] [address]   CLINIC:  Medical Oncology/Hematology  PCP:  Iona Beard, MD [address] [name] [id number] [phone]   REASON FOR VISIT:  Stage III left lung adenocarcinoma.  CURRENT THERAPY: Consolidation with durvalumab.    INTERVAL HISTORY:  Denise [name] 69 y.o. female seen for follow-up of stage III lung adenocarcinoma.  She tapered off her prednisone on Monday.  Her breathing, particularly dyspnea on exertion has not improved much.  She does not have any dyspnea while she is sitting down.  Appetite is not percent.  Energy levels are 50%.  REVIEW OF SYSTEMS:  Review of Systems  Respiratory: Positive for shortness of breath.   All other systems reviewed and are negative.    PAST MEDICAL/SURGICAL HISTORY:  Past Medical History:  Diagnosis Date  . Anemia   . Arthritis   . Diabetes mellitus    diet controlled  . GERD (gastroesophageal reflux disease)   . HTN (hypertension)   . HTN (hypertension)   . Port-A-Cath in place 01/06/2019  . Shortness of breath dyspnea    with exertion   Past Surgical History:  Procedure Laterality Date  . CHOLECYSTECTOMY  1997  . COLONOSCOPY    . GASTRIC BYPASS    . INCISIONAL HERNIA REPAIR  04/11/11  . IR IMAGING GUIDED PORT INSERTION  12/27/2018  . LAPAROSCOPIC SALPINGOOPHERECTOMY    . LAPAROTOMY  04/11/2011   Procedure: EXPLORATORY LAPAROTOMY;  Surgeon: Joyice Faster. Cornett, MD;  Location: [facility];  Service: General;  Laterality: N/A;  closure port hole  . TOTAL HIP ARTHROPLASTY  03/07/2012   Procedure: TOTAL HIP ARTHROPLASTY ANTERIOR APPROACH;  Surgeon: Mauri Pole, MD;  Location: [facility];  Service: Orthopedics;  Laterality: Right;  . TOTAL SHOULDER ARTHROPLASTY Left 01/21/2015  . TOTAL SHOULDER ARTHROPLASTY Left 01/21/2015   Procedure: LEFT TOTAL SHOULDER ARTHROPLASTY;  Surgeon: Justice Britain, MD;  Location: [place];  Service: Orthopedics;  Laterality: Left;  Marland Kitchen VAGINAL  HYSTERECTOMY       SOCIAL HISTORY:  Social History   Socioeconomic History  . Marital status: Single    Spouse name: Not on file  . Number of children: Not on file  . Years of education: 12th grade  . Highest education level: Not on file  Occupational History  . Occupation: Employed    Employer: [employer]  Tobacco Use  . Smoking status: Former Smoker    Packs/day: 1.00    Years: 12.00    Pack years: 12.00    Types: Cigarettes    Quit date: 03/20/1976    Years since quitting: 43.3  . Smokeless tobacco: Never Used  Substance and Sexual Activity  . Alcohol use: No  . Drug use: No  . Sexual activity: Never  Other Topics Concern  . Not on file  Social History Narrative  . Not on file   Social Determinants of Health   Financial Resource Strain: Low Risk   . Difficulty of Paying Living Expenses: Not very hard  Food Insecurity: No Food Insecurity  . Worried About Charity fundraiser in the Last Year: Never true  . Ran Out of Food in the Last Year: Never true  Transportation Needs: No Transportation Needs  . Lack of Transportation (Medical): No  . Lack of Transportation (Non-Medical): No  Physical Activity: Inactive  . Days of Exercise per Week: 0 days  . Minutes of Exercise per Session: 0 min  Stress:  No Stress Concern Present  . Feeling of Stress : Only a little  Social Connections: Somewhat Isolated  . Frequency of Communication with Friends and Family: More than three times a week  . Frequency of Social Gatherings with Friends and Family: Once a week  . Attends Religious Services: More than 4 times per year  . Active Member of Clubs or Organizations: No  . Attends Archivist Meetings: Never  . Marital Status: Never married  Intimate Partner Violence: Not At Risk  . Fear of Current or Ex-Partner: No  . Emotionally Abused: No  . Physically Abused: No  . Sexually Abused: No    FAMILY HISTORY:  Family History  Problem Relation Age of Onset   . Breast cancer Mother   . COPD Mother   . Arthritis Mother   . Diabetes Mother   . Hypertension Mother   . Hypertension Father   . Diabetes Father   . Breast cancer Sister   . Thyroid cancer Brother   . Huntington's disease Maternal Grandmother   . Heart attack Brother     CURRENT MEDICATIONS:  Outpatient Encounter Medications as of 07/24/2019  Medication Sig  . amLODipine (NORVASC) 5 MG tablet Take 5 mg by mouth every morning.   . Calcium Carbonate Antacid (TUMS PO) Take 4 tablets by mouth daily.   . Cyanocobalamin (VITAMIN B-12) 2500 MCG SUBL Place 2,500 mcg under the tongue every morning.   Hunt Oris IV Inject into the vein every 14 (fourteen) days.  . ferrous sulfate 325 (65 FE) MG tablet Take 1 tablet (325 mg total) by mouth 3 (three) times daily after meals. (Patient taking differently: Take 325 mg by mouth daily. )  . Multiple Vitamin (MULITIVITAMIN WITH MINERALS) TABS Take 1 tablet by mouth every morning.   Marland Kitchen omeprazole (PRILOSEC) 20 MG capsule Take 20 mg by mouth daily.  Marland Kitchen albuterol (VENTOLIN HFA) 108 (90 Base) MCG/ACT inhaler TAKE 2 PUFFS BY MOUTH EVERY 6 HOURS AS NEEDED FOR WHEEZE OR SHORTNESS OF BREATH (Patient not taking: Reported on 07/22/2019)  . ALPRAZolam (XANAX) 0.25 MG tablet Take 0.25 mg by mouth as needed.   . diphenhydrAMINE (BENADRYL) 25 MG tablet Take 25 mg by mouth every 6 (six) hours as needed.  . gabapentin (NEURONTIN) 300 MG capsule Take 300 mg by mouth at bedtime as needed for pain.  Marland Kitchen ibuprofen (ADVIL,MOTRIN) 800 MG tablet Take 1 tablet (800 mg total) by mouth 3 (three) times daily. (Patient not taking: Reported on 07/24/2019)  . naproxen (NAPROSYN) 500 MG tablet Take 500 mg by mouth as needed.   . [DISCONTINUED] prochlorperazine (COMPAZINE) 10 MG tablet Take 1 tablet (10 mg total) by mouth every 6 (six) hours as needed (Nausea or vomiting).  . [DISCONTINUED] sodium chloride flush (NS) 0.9 % injection 10 mL    No facility-administered encounter  medications on file as of 07/24/2019.    ALLERGIES:  No Known Allergies   PHYSICAL EXAM:  ECOG Performance status: 0  Vitals:   07/24/19 1041  BP: 139/66  Pulse: 82  Resp: 18  Temp: (!) 96.9 F (36.1 C)  SpO2: 96%   Filed Weights   07/24/19 1041  Weight: 285 lb 1.6 oz (129.3 kg)    Physical Exam Vitals reviewed.  Constitutional:      Appearance: Normal appearance.  Cardiovascular:     Rate and Rhythm: Normal rate and regular rhythm.     Heart sounds: Normal heart sounds.  Pulmonary:     Effort: Pulmonary  effort is normal.     Breath sounds: Normal breath sounds.  Abdominal:     General: There is no distension.     Palpations: Abdomen is soft. There is no mass.  Musculoskeletal:        General: Swelling present.  Lymphadenopathy:     Cervical: No cervical adenopathy.  Skin:    General: Skin is warm.  Neurological:     General: No focal deficit present.     Mental Status: She is alert and oriented to person, place, and time.  Psychiatric:        Mood and Affect: Mood normal.        Behavior: Behavior normal.      LABORATORY DATA:  I have reviewed the labs as listed.  CBC    Component Value Date/Time   WBC 5.1 07/24/2019 0943   RBC 4.27 07/24/2019 0943   HGB 11.5 (L) 07/24/2019 0943   HCT 37.5 07/24/2019 0943   PLT 323 07/24/2019 0943   MCV 87.8 07/24/2019 0943   MCH 26.9 07/24/2019 0943   MCHC 30.7 07/24/2019 0943   RDW 15.9 (H) 07/24/2019 0943   LYMPHSABS 0.6 (L) 07/24/2019 0943   MONOABS 0.4 07/24/2019 0943   EOSABS 0.2 07/24/2019 0943   BASOSABS 0.0 07/24/2019 0943   CMP Latest Ref Rng & Units 07/24/2019 07/01/2019 06/16/2019  Glucose 70 - 99 mg/dL 107(H) 111(H) 161(H)  BUN 8 - 23 mg/dL 12 15 17   Creatinine 0.44 - 1.00 mg/dL 0.75 0.86 0.95  Sodium 135 - 145 mmol/L 143 142 142  Potassium 3.5 - 5.1 mmol/L 3.9 4.6 4.6  Chloride 98 - 111 mmol/L 110 106 105  CO2 22 - 32 mmol/L 25 27 27   Calcium 8.9 - 10.3 mg/dL 8.7(L) 9.0 9.3  Total Protein 6.5 -  8.1 g/dL 6.2(L) 6.6 6.4(L)  Total Bilirubin 0.3 - 1.2 mg/dL 0.4 0.4 0.3  Alkaline Phos 38 - 126 U/L 79 97 97  AST 15 - 41 U/L 18 21 33  ALT 0 - 44 U/L 18 34 45(H)       DIAGNOSTIC IMAGING:  I have reviewed her scans.    ASSESSMENT & PLAN:   Adenocarcinoma of left lung ([place]) 1.  Clinical stage IIIb (T3N2) adenocarcinoma of the lung: -Chemoradiation therapy with carboplatin and paclitaxel from 01/07/2019 through 02/11/2019. -Consolidation immunotherapy durvalumab from 03/19/2019 through 04/15/2018. -CT chest on July 02, 2019 shows left upper lobe mass measuring 2.6 x 2.0 cm, showing improvement in size. -We reviewed her labs.  White count and platelets are normal.  LFTs are normal.  She will proceed with restarting of durvalumab today. -I will reevaluate her in 2 weeks.  If she continues to tolerate every 2 weekly durvalumab, we will switch her to monthly durvalumab.  2.  Radiation pneumonitis: -Prednisone 60 mg started on May 19, 2019, gradually tapered off on Jul 21, 2019. -CT chest on July 02, 2019 showed improved pneumonitis.  3.  Shortness of breath on exertion: -She reported shortness of breath on exertion which has not improved even with prednisone and even with improvement on the CT scan findings. -Her oxygen saturation is 96% on room air.  We have checked her oxygen saturation after walking and she got short winded.  It dropped to 91%.  She could not walk any further.  4.  Meningioma: -MRI of the brain on July 18, 2019 showed left sphenoid wing meningioma measuring 2.6 x 2.0 x 2.6 cm unchanged.  No new enhancing intracranial lesions.  Increased left temporal white matter edema. -As she was symptomatic, watchful waiting is recommended by Dr. Mickeal Skinner.   Orders placed this encounter:  No orders of the defined types were placed in this encounter.     Derek Jack, MD [place] [phone]

## 2019-07-24 NOTE — Patient Instructions (Addendum)
[facility] at [facility] Discharge Instructions  You were seen today by Dr. Katragadda. He went over your recent lab results. He will see you back in 2 weeks for labs and follow up.   Thank you for choosing [facility] at [facility] to provide your oncology and hematology care.  To afford each patient quality time with our provider, please arrive at least 15 minutes before your scheduled appointment time.   If you have a lab appointment with the Cancer Center please come in thru the  Main Entrance and check in at the main information desk  You need to re-schedule your appointment should you arrive 10 or more minutes late.  We strive to give you quality time with our providers, and arriving late affects you and other patients whose appointments are after yours.  Also, if you no show three or more times for appointments you may be dismissed from the clinic at the providers discretion.     Again, thank you for choosing [facility].  Our hope is that these requests will decrease the amount of time that you wait before being seen by our physicians.       _____________________________________________________________  Should you have questions after your visit to [facility], please contact our office at [phone] between the hours of 8:00 a.m. and 4:30 p.m.  Voicemails left after 4:00 p.m. will not be returned until the following business day.  For prescription refill requests, have your pharmacy contact our office and allow 72 hours.    Cancer Center Support Programs:   > Cancer Support Group  2nd Tuesday of the month 1pm-2pm, Journey Room    

## 2019-07-24 NOTE — Progress Notes (Signed)

## 2019-07-25 NOTE — Assessment & Plan Note (Signed)
1.  Clinical stage IIIb (T3N2) adenocarcinoma of the lung: -Chemoradiation therapy with carboplatin and paclitaxel from 01/07/2019 through 02/11/2019. -Consolidation immunotherapy durvalumab from 03/19/2019 through 04/15/2018. -CT chest on July 02, 2019 shows left upper lobe mass measuring 2.6 x 2.0 cm, showing improvement in size. -We reviewed her labs.  White count and platelets are normal.  LFTs are normal.  She will proceed with restarting of durvalumab today. -I will reevaluate her in 2 weeks.  If she continues to tolerate every 2 weekly durvalumab, we will switch her to monthly durvalumab.  2.  Radiation pneumonitis: -Prednisone 60 mg started on May 19, 2019, gradually tapered off on Jul 21, 2019. -CT chest on July 02, 2019 showed improved pneumonitis.  3.  Shortness of breath on exertion: -She reported shortness of breath on exertion which has not improved even with prednisone and even with improvement on the CT scan findings. -Her oxygen saturation is 96% on room air.  We have checked her oxygen saturation after walking and she got short winded.  It dropped to 91%.  She could not walk any further.  4.  Meningioma: -MRI of the brain on July 18, 2019 showed left sphenoid wing meningioma measuring 2.6 x 2.0 x 2.6 cm unchanged.  No new enhancing intracranial lesions.  Increased left temporal white matter edema. -As she was symptomatic, watchful waiting is recommended by Dr. Mickeal Skinner.

## 2019-08-06 NOTE — Progress Notes (Signed)
[place], [place] [address]   CLINIC:  Medical Oncology/Hematology  PCP:  Iona Beard, [address] / [place] [address] [phone]   REASON FOR VISIT:  Follow-up for Stage III left lung adenocarcinoma.  CURRENT THERAPY: Continue with planned chemotherapy treatment: Consolidation with durvalumab.  BRIEF ONCOLOGIC HISTORY:  Oncology History  Adenocarcinoma of left lung ([place])  12/09/2018 Initial Diagnosis   Adenocarcinoma of left lung ([place])   01/02/2019 Cancer Staging   Staging form: Lung, AJCC 8th Edition - Clinical stage from 01/02/2019: Stage IIIB (cT3, cN2, cM0) - Signed by Derek Jack, MD on 01/02/2019   01/07/2019 - 02/17/2019 Chemotherapy   The patient had palonosetron (ALOXI) injection 0.25 mg, 0.25 mg, Intravenous,  Once, 6 of 6 cycles Administration: 0.25 mg (01/07/2019), 0.25 mg (01/14/2019), 0.25 mg (01/21/2019), 0.25 mg (01/28/2019), 0.25 mg (02/04/2019), 0.25 mg (02/11/2019) CARBOplatin (PARAPLATIN) 270 mg in sodium chloride 0.9 % 250 mL chemo infusion, 270 mg (100 % of original dose 266.4 mg), Intravenous,  Once, 6 of 6 cycles Dose modification:   (original dose 266.4 mg, Cycle 1),   (original dose 266.4 mg, Cycle 2),   (original dose 266.4 mg, Cycle 3),   (original dose 266.4 mg, Cycle 4) Administration: 270 mg (01/07/2019), 270 mg (01/14/2019), 270 mg (01/21/2019), 270 mg (01/28/2019), 270 mg (02/04/2019), 270 mg (02/11/2019) PACLitaxel (TAXOL) 108 mg in sodium chloride 0.9 % 250 mL chemo infusion (</= 80mg /m2), 45 mg/m2 = 108 mg, Intravenous,  Once, 6 of 6 cycles Administration: 108 mg (01/07/2019), 108 mg (01/14/2019), 108 mg (01/21/2019), 108 mg (01/28/2019), 108 mg (02/04/2019), 108 mg (02/11/2019) fosaprepitant (EMEND) 150 mg, dexamethasone (DECADRON) 12 mg in sodium chloride 0.9 % 145 mL IVPB, , Intravenous,  Once, 5 of 5 cycles Administration:  (01/14/2019),  (01/21/2019),  (01/28/2019),  (02/04/2019),   (02/11/2019)  for chemotherapy treatment.    03/19/2019 -  Chemotherapy   The patient had durvalumab (IMFINZI) 1,240 mg in sodium chloride 0.9 % 100 mL chemo infusion, 9.9 mg/kg = 1,260 mg, Intravenous,  Once, 5 of 6 cycles Administration: 1,240 mg (03/19/2019), 1,240 mg (04/02/2019), 1,240 mg (04/16/2019), 1,240 mg (07/24/2019), 1,240 mg (08/07/2019)  for chemotherapy treatment.      CANCER STAGING: Cancer Staging Adenocarcinoma of left lung [facility]) Staging form: Lung, AJCC 8th Edition - Clinical stage from 01/02/2019: Stage IIIB (cT3, cN2, cM0) - Signed by Derek Jack, MD on 01/02/2019   INTERVAL HISTORY:  Denise [name] 69 y.o. female returns for routine follow-up and consideration for next cycle of chemotherapy. Denise [name] was last seen on 07/24/2019.  Due for cycle #5 of durvalumab today.   Overall, she tells me she has been feeling pretty well. She notes that she doesn't believe like he breathing has improved. She declines having seen a lung specialist, but she has seen a pulmonologist before. She says that when she takes a deep breath, she feels like she can feel a spot on her lung. She denies pain.   Overall, she feels ready for next cycle of chemo today.    REVIEW OF SYSTEMS:  Review of Systems  Constitutional: Negative for appetite change, chills, diaphoresis, fatigue and fever.  HENT:   Negative for mouth sores, sore throat and trouble swallowing.   Eyes: Negative for eye problems.  Respiratory: Negative for cough, shortness of breath and wheezing.   Cardiovascular: Negative for chest pain, leg swelling and palpitations.  Gastrointestinal: Negative for abdominal pain, constipation, diarrhea, nausea and  vomiting.  Genitourinary: Negative for bladder incontinence, dysuria and frequency.   Musculoskeletal: Negative for arthralgias, back pain and myalgias.  Skin: Negative for rash.  Neurological: Negative for dizziness, extremity weakness, headaches and numbness.    Hematological: Does not bruise/bleed easily.  Psychiatric/Behavioral: Negative for depression and sleep disturbance. The patient is not nervous/anxious.     PAST MEDICAL/SURGICAL HISTORY:  Past Medical History:  Diagnosis Date  . Anemia   . Arthritis   . Diabetes mellitus    diet controlled  . GERD (gastroesophageal reflux disease)   . HTN (hypertension)   . HTN (hypertension)   . Port-A-Cath in place 01/06/2019  . Shortness of breath dyspnea    with exertion   Past Surgical History:  Procedure Laterality Date  . CHOLECYSTECTOMY  1997  . COLONOSCOPY    . GASTRIC BYPASS    . INCISIONAL HERNIA REPAIR  04/11/11  . IR IMAGING GUIDED PORT INSERTION  12/27/2018  . LAPAROSCOPIC SALPINGOOPHERECTOMY    . LAPAROTOMY  04/11/2011   Procedure: EXPLORATORY LAPAROTOMY;  Surgeon: Joyice Faster. Cornett, MD;  Location: [facility];  Service: General;  Laterality: N/A;  closure port hole  . TOTAL HIP ARTHROPLASTY  03/07/2012   Procedure: TOTAL HIP ARTHROPLASTY ANTERIOR APPROACH;  Surgeon: Mauri Pole, MD;  Location: [facility];  Service: Orthopedics;  Laterality: Right;  . TOTAL SHOULDER ARTHROPLASTY Left 01/21/2015  . TOTAL SHOULDER ARTHROPLASTY Left 01/21/2015   Procedure: LEFT TOTAL SHOULDER ARTHROPLASTY;  Surgeon: Justice Britain, MD;  Location: [facility];  Service: Orthopedics;  Laterality: Left;  Marland Kitchen VAGINAL HYSTERECTOMY      SOCIAL HISTORY:  Social History   Socioeconomic History  . Marital status: Single    Spouse name: Not on file  . Number of children: Not on file  . Years of education: 12th grade  . Highest education level: Not on file  Occupational History  . Occupation: Employed    Employer: [employer]  Tobacco Use  . Smoking status: Former Smoker    Packs/day: 1.00    Years: 12.00    Pack years: 12.00    Types: Cigarettes    Quit date: 03/20/1976    Years since quitting: 43.4  . Smokeless tobacco: Never Used  Substance and Sexual Activity  . Alcohol use: No  . Drug use: No   . Sexual activity: Never  Other Topics Concern  . Not on file  Social History Narrative  . Not on file   Social Determinants of Health   Financial Resource Strain: Low Risk   . Difficulty of Paying Living Expenses: Not very hard  Food Insecurity: No Food Insecurity  . Worried About Charity fundraiser in the Last Year: Never true  . Ran Out of Food in the Last Year: Never true  Transportation Needs: No Transportation Needs  . Lack of Transportation (Medical): No  . Lack of Transportation (Non-Medical): No  Physical Activity: Inactive  . Days of Exercise per Week: 0 days  . Minutes of Exercise per Session: 0 min  Stress: No Stress Concern Present  . Feeling of Stress : Only a little  Social Connections: Somewhat Isolated  . Frequency of Communication with Friends and Family: More than three times a week  . Frequency of Social Gatherings with Friends and Family: Once a week  . Attends Religious Services: More than 4 times per year  . Active Member of Clubs or Organizations: No  . Attends Archivist Meetings: Never  . Marital Status: Never  married  Intimate Partner Violence: Not At Risk  . Fear of Current or Ex-Partner: No  . Emotionally Abused: No  . Physically Abused: No  . Sexually Abused: No    FAMILY HISTORY:  Family History  Problem Relation Age of Onset  . Breast cancer Mother   . COPD Mother   . Arthritis Mother   . Diabetes Mother   . Hypertension Mother   . Hypertension Father   . Diabetes Father   . Breast cancer Sister   . Thyroid cancer Brother   . Huntington's disease Maternal Grandmother   . Heart attack Brother     CURRENT MEDICATIONS:  Current Outpatient Medications  Medication Sig Dispense Refill  . amLODipine (NORVASC) 5 MG tablet Take 5 mg by mouth every morning.     . Calcium Carbonate Antacid (TUMS PO) Take 4 tablets by mouth daily.     . Cyanocobalamin (VITAMIN B-12) 2500 MCG SUBL Place 2,500 mcg under the tongue every morning.      Hunt Oris IV Inject into the vein every 14 (fourteen) days.    . ferrous sulfate 325 (65 FE) MG tablet Take 1 tablet (325 mg total) by mouth 3 (three) times daily after meals. (Patient taking differently: Take 325 mg by mouth daily. )    . ibuprofen (ADVIL,MOTRIN) 800 MG tablet Take 1 tablet (800 mg total) by mouth 3 (three) times daily. 90 tablet 2  . Multiple Vitamin (MULITIVITAMIN WITH MINERALS) TABS Take 1 tablet by mouth every morning.     Marland Kitchen omeprazole (PRILOSEC) 20 MG capsule Take 20 mg by mouth daily.    Marland Kitchen albuterol (VENTOLIN HFA) 108 (90 Base) MCG/ACT inhaler TAKE 2 PUFFS BY MOUTH EVERY 6 HOURS AS NEEDED FOR WHEEZE OR SHORTNESS OF BREATH (Patient not taking: Reported on 08/07/2019) 8 g 6  . ALPRAZolam (XANAX) 0.25 MG tablet Take 0.25 mg by mouth as needed.     . diphenhydrAMINE (BENADRYL) 25 MG tablet Take 25 mg by mouth every 6 (six) hours as needed.    . gabapentin (NEURONTIN) 300 MG capsule Take 300 mg by mouth at bedtime as needed for pain.    . naproxen (NAPROSYN) 500 MG tablet Take 500 mg by mouth as needed.      No current facility-administered medications for this visit.    ALLERGIES:  No Known Allergies  PHYSICAL EXAM:  Performance status (ECOG): 1 - Symptomatic but completely ambulatory  Vitals:   08/07/19 0911  BP: (!) 126/59  Pulse: 80  Resp: 19  Temp: (!) 97.1 F (36.2 C)  SpO2: 100%   Wt Readings from Last 3 Encounters:  08/07/19 286 lb 8 oz (130 kg)  07/24/19 285 lb 1.6 oz (129.3 kg)  07/22/19 287 lb 14.4 oz (130.6 kg)   Physical Exam Constitutional:      General: She is not in acute distress.    Appearance: Normal appearance. She is normal weight. She is not ill-appearing.  HENT:     Mouth/Throat:     Mouth: Mucous membranes are moist.     Pharynx: No oropharyngeal exudate or posterior oropharyngeal erythema.  Eyes:     Extraocular Movements: Extraocular movements intact.     Pupils: Pupils are equal, round, and reactive to light.   Cardiovascular:     Rate and Rhythm: Normal rate and regular rhythm.     Pulses: Normal pulses.     Heart sounds: Normal heart sounds. No murmur. No friction rub. No gallop.   Pulmonary:  Effort: Pulmonary effort is normal.     Breath sounds: Normal breath sounds. No wheezing, rhonchi or rales.  Abdominal:     Palpations: There is no mass.     Tenderness: There is no abdominal tenderness. There is no guarding.  Musculoskeletal:        General: No swelling or tenderness.     Right lower leg: No edema.     Left lower leg: No edema.  Skin:    Findings: No bruising or erythema.  Neurological:     Mental Status: She is alert and oriented to person, place, and time.     Sensory: No sensory deficit.  Psychiatric:        Mood and Affect: Mood normal.        Behavior: Behavior normal.        Thought Content: Thought content normal.        Judgment: Judgment normal.     LABORATORY DATA:  I have reviewed the labs as listed.  CBC Latest Ref Rng & Units 08/07/2019 07/24/2019 07/01/2019  WBC 4.0 - 10.5 K/uL 4.4 5.1 10.1  Hemoglobin 12.0 - 15.0 g/dL 11.5(L) 11.5(L) 12.3  Hematocrit 36.0 - 46.0 % 38.4 37.5 41.7  Platelets 150 - 400 K/uL 340 323 341   CMP Latest Ref Rng & Units 08/07/2019 07/24/2019 07/01/2019  Glucose 70 - 99 mg/dL 115(H) 107(H) 111(H)  BUN 8 - 23 mg/dL 12 12 15   Creatinine 0.44 - 1.00 mg/dL 0.69 0.75 0.86  Sodium 135 - 145 mmol/L 141 143 142  Potassium 3.5 - 5.1 mmol/L 4.0 3.9 4.6  Chloride 98 - 111 mmol/L 105 110 106  CO2 22 - 32 mmol/L 27 25 27   Calcium 8.9 - 10.3 mg/dL 9.0 8.7(L) 9.0  Total Protein 6.5 - 8.1 g/dL 6.3(L) 6.2(L) 6.6  Total Bilirubin 0.3 - 1.2 mg/dL 0.5 0.4 0.4  Alkaline Phos 38 - 126 U/L 87 79 97  AST 15 - 41 U/L 15 18 21   ALT 0 - 44 U/L 15 18 34    DIAGNOSTIC IMAGING:  I have independently reviewed the scans and discussed with the patient.  ASSESSMENT & PLAN:  Adenocarcinoma of left lung ([place]) Assessment: -Chemoradiation therapy with  carboplatin and paclitaxel from 01/07/2019 through 02/11/2019. -Consolidation immunotherapy with durvalumab from 03/19/2019 through 04/15/2018, held due to pneumonitis. -CT chest on 07/02/2019 shows left upper lobe lung mass measuring 2.6 x 2.0 cm.  It shows improvement in size. -MRI of the brain on 07/18/2019 showed left sphenoid wing meningioma measuring 2.6 x 2.0 x 2.6 cm unchanged.  No new enhancing intracranial lesion.  Increased left temporal white matter edema.  Plan: #1 clinical stage IIIb (T3N2) adenocarcinoma of the lung: -Chemoradiation therapy with carboplatin and paclitaxel from 01/07/2019 through 02/11/2019. -Consolidation immunotherapy durvalumab restarted on 07/24/2019. -She did not experience any worsening of shortness of breath on exertion since we started her back on durvalumab. -I have reviewed her labs.  She will proceed with her next treatment today.  We will reevaluate her in 2 weeks.  #2 shortness of breath on exertion: -She has 100% saturation on room air.  She only gets short of breath when she tries to walk.  She has been weaned off of prednisone on 07/21/2019.  Her oxygen saturations after walking are at 91%. -I will make a referral to pulmonary.  #3 radiation pneumonitis: -Prednisone 60 mg started on 05/19/2019, gradually tapered off on 07/21/2019. -CT chest on 07/02/2019 showed improved pneumonitis.  #4 meningioma: -She does  not report any left hemifacial pain.  As she was asymptomatic, watchful waiting was recommended by Dr. Mickeal Skinner.    Orders placed this encounter:  No orders of the defined types were placed in this encounter.     Derek Jack, MD, 08/07/19 6:10 PM  [place] [phone]   I, Jacqualyn Posey, am acting as a scribe for Dr. Sanda Linger.  I, Derek Jack MD, have reviewed the above documentation for accuracy and completeness, and I agree with the above.

## 2019-08-07 ENCOUNTER — Encounter (HOSPITAL_COMMUNITY): Payer: Self-pay | Admitting: Hematology

## 2019-08-07 ENCOUNTER — Other Ambulatory Visit: Payer: Self-pay

## 2019-08-07 ENCOUNTER — Inpatient Hospital Stay (HOSPITAL_COMMUNITY): Payer: Medicare Other

## 2019-08-07 ENCOUNTER — Inpatient Hospital Stay (HOSPITAL_BASED_OUTPATIENT_CLINIC_OR_DEPARTMENT_OTHER): Payer: Medicare Other | Admitting: Hematology

## 2019-08-07 VITALS — BP 107/45 | HR 94 | Temp 96.9°F | Resp 18

## 2019-08-07 DIAGNOSIS — E119 Type 2 diabetes mellitus without complications: Secondary | ICD-10-CM | POA: Diagnosis not present

## 2019-08-07 DIAGNOSIS — C3492 Malignant neoplasm of unspecified part of left bronchus or lung: Secondary | ICD-10-CM

## 2019-08-07 DIAGNOSIS — R0602 Shortness of breath: Secondary | ICD-10-CM | POA: Diagnosis not present

## 2019-08-07 DIAGNOSIS — Z5112 Encounter for antineoplastic immunotherapy: Secondary | ICD-10-CM | POA: Diagnosis not present

## 2019-08-07 DIAGNOSIS — D329 Benign neoplasm of meninges, unspecified: Secondary | ICD-10-CM | POA: Diagnosis not present

## 2019-08-07 DIAGNOSIS — I10 Essential (primary) hypertension: Secondary | ICD-10-CM | POA: Diagnosis not present

## 2019-08-07 DIAGNOSIS — C3412 Malignant neoplasm of upper lobe, left bronchus or lung: Secondary | ICD-10-CM | POA: Diagnosis not present

## 2019-08-07 DIAGNOSIS — Z95828 Presence of other vascular implants and grafts: Secondary | ICD-10-CM

## 2019-08-07 LAB — CBC WITH DIFFERENTIAL/PLATELET
Abs Immature Granulocytes: 0.01 10*3/uL (ref 0.00–0.07)
Basophils Absolute: 0 10*3/uL (ref 0.0–0.1)
Basophils Relative: 1 %
Eosinophils Absolute: 0.1 10*3/uL (ref 0.0–0.5)
Eosinophils Relative: 3 %
HCT: 38.4 % (ref 36.0–46.0)
Hemoglobin: 11.5 g/dL — ABNORMAL LOW (ref 12.0–15.0)
Immature Granulocytes: 0 %
Lymphocytes Relative: 13 %
Lymphs Abs: 0.6 10*3/uL — ABNORMAL LOW (ref 0.7–4.0)
MCH: 26.2 pg (ref 26.0–34.0)
MCHC: 29.9 g/dL — ABNORMAL LOW (ref 30.0–36.0)
MCV: 87.5 fL (ref 80.0–100.0)
Monocytes Absolute: 0.4 10*3/uL (ref 0.1–1.0)
Monocytes Relative: 8 %
Neutro Abs: 3.3 10*3/uL (ref 1.7–7.7)
Neutrophils Relative %: 75 %
Platelets: 340 10*3/uL (ref 150–400)
RBC: 4.39 MIL/uL (ref 3.87–5.11)
RDW: 15.5 % (ref 11.5–15.5)
WBC: 4.4 10*3/uL (ref 4.0–10.5)
nRBC: 0 % (ref 0.0–0.2)

## 2019-08-07 LAB — COMPREHENSIVE METABOLIC PANEL
ALT: 15 U/L (ref 0–44)
AST: 15 U/L (ref 15–41)
Albumin: 3.4 g/dL — ABNORMAL LOW (ref 3.5–5.0)
Alkaline Phosphatase: 87 U/L (ref 38–126)
Anion gap: 9 (ref 5–15)
BUN: 12 mg/dL (ref 8–23)
CO2: 27 mmol/L (ref 22–32)
Calcium: 9 mg/dL (ref 8.9–10.3)
Chloride: 105 mmol/L (ref 98–111)
Creatinine, Ser: 0.69 mg/dL (ref 0.44–1.00)
GFR calc Af Amer: >60 mL/min (ref >60)
GFR calc non Af Amer: >60 mL/min (ref >60)
Glucose, Bld: 115 mg/dL — ABNORMAL HIGH (ref 70–99)
Potassium: 4 mmol/L (ref 3.5–5.1)
Sodium: 141 mmol/L (ref 135–145)
Total Bilirubin: 0.5 mg/dL (ref 0.3–1.2)
Total Protein: 6.3 g/dL — ABNORMAL LOW (ref 6.5–8.1)

## 2019-08-07 LAB — TSH: TSH: 1.411 u[IU]/mL (ref 0.350–4.500)

## 2019-08-07 MED ORDER — HEPARIN SOD (PORK) LOCK FLUSH 100 UNIT/ML IV SOLN
500.0000 [IU] | Freq: Once | INTRAVENOUS | Status: AC | PRN
  Administered 2019-08-07: 500 [IU]

## 2019-08-07 MED ORDER — SODIUM CHLORIDE 0.9 % IV SOLN
10.0000 mg/kg | Freq: Once | INTRAVENOUS | Status: AC
  Administered 2019-08-07: 1240 mg via INTRAVENOUS
  Filled 2019-08-07: qty 4.8

## 2019-08-07 MED ORDER — SODIUM CHLORIDE 0.9% FLUSH
10.0000 mL | INTRAVENOUS | Status: DC | PRN
  Administered 2019-08-07: 10 mL via INTRAVENOUS

## 2019-08-07 MED ORDER — SODIUM CHLORIDE 0.9% FLUSH: 10.0000 mL | INTRAVENOUS | Status: DC | PRN

## 2019-08-07 MED ORDER — SODIUM CHLORIDE 0.9 % IV SOLN: Freq: Once | INTRAVENOUS | Status: AC

## 2019-08-07 NOTE — Progress Notes (Signed)
Patient presents today for treatment and follow up visit with Dr. Delton Coombes. Labs reviewed by MD. Message received to proceed with treatment today.  Vital signs within parameters for treatment.   Treatment given today per MD orders. Tolerated infusion without adverse affects. Vital signs stable. No complaints at this time. Discharged from clinic ambulatory. F/U with [facility] as scheduled.

## 2019-08-07 NOTE — Patient Instructions (Signed)
[facility] Discharge Instructions for Patients Receiving Chemotherapy  Today you received the following chemotherapy agents   To help prevent nausea and vomiting after your treatment, we encourage you to take your nausea medication   If you develop nausea and vomiting that is not controlled by your nausea medication, call the clinic.   BELOW ARE SYMPTOMS THAT SHOULD BE REPORTED IMMEDIATELY:  *FEVER GREATER THAN 100.5 F  *CHILLS WITH OR WITHOUT FEVER  NAUSEA AND VOMITING THAT IS NOT CONTROLLED WITH YOUR NAUSEA MEDICATION  *UNUSUAL SHORTNESS OF BREATH  *UNUSUAL BRUISING OR BLEEDING  TENDERNESS IN MOUTH AND THROAT WITH OR WITHOUT PRESENCE OF ULCERS  *URINARY PROBLEMS  *BOWEL PROBLEMS  UNUSUAL RASH Items with * indicate a potential emergency and should be followed up as soon as possible.  Feel free to call the clinic should you have any questions or concerns. The clinic phone number is [phone].  Please show the CHEMO ALERT CARD at check-in to the Emergency Department and triage nurse.   

## 2019-08-07 NOTE — Assessment & Plan Note (Signed)
Assessment: -Chemoradiation therapy with carboplatin and paclitaxel from 01/07/2019 through 02/11/2019. -Consolidation immunotherapy with durvalumab from 03/19/2019 through 04/15/2018, held due to pneumonitis. -CT chest on 07/02/2019 shows left upper lobe lung mass measuring 2.6 x 2.0 cm.  It shows improvement in size. -MRI of the brain on 07/18/2019 showed left sphenoid wing meningioma measuring 2.6 x 2.0 x 2.6 cm unchanged.  No new enhancing intracranial lesion.  Increased left temporal white matter edema.  Plan: #1 clinical stage IIIb (T3N2) adenocarcinoma of the lung: -Chemoradiation therapy with carboplatin and paclitaxel from 01/07/2019 through 02/11/2019. -Consolidation immunotherapy durvalumab restarted on 07/24/2019. -She did not experience any worsening of shortness of breath on exertion since we started her back on durvalumab. -I have reviewed her labs.  She will proceed with her next treatment today.  We will reevaluate her in 2 weeks.  #2 shortness of breath on exertion: -She has 100% saturation on room air.  She only gets short of breath when she tries to walk.  She has been weaned off of prednisone on 07/21/2019.  Her oxygen saturations after walking are at 91%. -I will make a referral to pulmonary.  #3 radiation pneumonitis: -Prednisone 60 mg started on 05/19/2019, gradually tapered off on 07/21/2019. -CT chest on 07/02/2019 showed improved pneumonitis.  #4 meningioma: -She does not report any left hemifacial pain.  As she was asymptomatic, watchful waiting was recommended by Dr. Mickeal Skinner.

## 2019-08-07 NOTE — Progress Notes (Signed)
Patient has been assessed, vital signs and labs have been reviewed by Dr. Katragadda. ANC, Creatinine, LFTs, and Platelets are within treatment parameters per Dr. Katragadda. The patient is good to proceed with treatment at this time.  

## 2019-08-07 NOTE — Patient Instructions (Signed)
[place] at [facility] Discharge Instructions  You were seen today by Dr. Delton Coombes. He went over your recent results. He will make a referral to a lung specialist to see if we can improve your breathing. He will see you back in for labs and follow up.   Thank you for choosing [place] at [facility] to provide your oncology and hematology care.  To afford each patient quality time with our provider, please arrive at least 15 minutes before your scheduled appointment time.   If you have a lab appointment with the [facility] please come in thru the  Main Entrance and check in at the main information desk  You need to re-schedule your appointment should you arrive 10 or more minutes late.  We strive to give you quality time with our providers, and arriving late affects you and other patients whose appointments are after yours.  Also, if you no show three or more times for appointments you may be dismissed from the clinic at the providers discretion.     Again, thank you for choosing [facility].  Our hope is that these requests will decrease the amount of time that you wait before being seen by our physicians.       _____________________________________________________________  Should you have questions after your visit to [facility], please contact our office at [phone] between the hours of 8:00 a.m. and 4:30 p.m.  Voicemails left after 4:00 p.m. will not be returned until the following business day.  For prescription refill requests, have your pharmacy contact our office and allow 72 hours.    Cancer Center Support Programs:   > Cancer Support Group  2nd Tuesday of the month 1pm-2pm, Journey Room

## 2019-08-19 NOTE — Progress Notes (Signed)

## 2019-08-21 ENCOUNTER — Inpatient Hospital Stay (HOSPITAL_BASED_OUTPATIENT_CLINIC_OR_DEPARTMENT_OTHER): Payer: Medicare Other | Admitting: Hematology

## 2019-08-21 ENCOUNTER — Inpatient Hospital Stay (HOSPITAL_COMMUNITY): Payer: Medicare Other | Attending: Hematology

## 2019-08-21 ENCOUNTER — Other Ambulatory Visit: Payer: Self-pay

## 2019-08-21 ENCOUNTER — Inpatient Hospital Stay (HOSPITAL_COMMUNITY): Payer: Medicare Other

## 2019-08-21 VITALS — BP 111/62 | HR 84 | Temp 97.9°F | Resp 18 | Wt 290.2 lb

## 2019-08-21 DIAGNOSIS — C3412 Malignant neoplasm of upper lobe, left bronchus or lung: Secondary | ICD-10-CM | POA: Insufficient documentation

## 2019-08-21 DIAGNOSIS — Z8349 Family history of other endocrine, nutritional and metabolic diseases: Secondary | ICD-10-CM | POA: Diagnosis not present

## 2019-08-21 DIAGNOSIS — Z95828 Presence of other vascular implants and grafts: Secondary | ICD-10-CM

## 2019-08-21 DIAGNOSIS — R42 Dizziness and giddiness: Secondary | ICD-10-CM | POA: Diagnosis not present

## 2019-08-21 DIAGNOSIS — C3492 Malignant neoplasm of unspecified part of left bronchus or lung: Secondary | ICD-10-CM

## 2019-08-21 DIAGNOSIS — Z87891 Personal history of nicotine dependence: Secondary | ICD-10-CM | POA: Insufficient documentation

## 2019-08-21 DIAGNOSIS — Z5112 Encounter for antineoplastic immunotherapy: Secondary | ICD-10-CM | POA: Diagnosis not present

## 2019-08-21 DIAGNOSIS — D329 Benign neoplasm of meninges, unspecified: Secondary | ICD-10-CM | POA: Insufficient documentation

## 2019-08-21 DIAGNOSIS — Z801 Family history of malignant neoplasm of trachea, bronchus and lung: Secondary | ICD-10-CM | POA: Diagnosis not present

## 2019-08-21 LAB — CBC WITH DIFFERENTIAL/PLATELET
Abs Immature Granulocytes: 0.01 10*3/uL (ref 0.00–0.07)
Basophils Absolute: 0 10*3/uL (ref 0.0–0.1)
Basophils Relative: 1 %
Eosinophils Absolute: 0.1 10*3/uL (ref 0.0–0.5)
Eosinophils Relative: 3 %
HCT: 37.6 % (ref 36.0–46.0)
Hemoglobin: 11.4 g/dL — ABNORMAL LOW (ref 12.0–15.0)
Immature Granulocytes: 0 %
Lymphocytes Relative: 15 %
Lymphs Abs: 0.7 10*3/uL (ref 0.7–4.0)
MCH: 26.6 pg (ref 26.0–34.0)
MCHC: 30.3 g/dL (ref 30.0–36.0)
MCV: 87.6 fL (ref 80.0–100.0)
Monocytes Absolute: 0.4 10*3/uL (ref 0.1–1.0)
Monocytes Relative: 9 %
Neutro Abs: 3.5 10*3/uL (ref 1.7–7.7)
Neutrophils Relative %: 72 %
Platelets: 310 10*3/uL (ref 150–400)
RBC: 4.29 MIL/uL (ref 3.87–5.11)
RDW: 15.7 % — ABNORMAL HIGH (ref 11.5–15.5)
WBC: 4.8 10*3/uL (ref 4.0–10.5)
nRBC: 0 % (ref 0.0–0.2)

## 2019-08-21 LAB — COMPREHENSIVE METABOLIC PANEL
ALT: 15 U/L (ref 0–44)
AST: 17 U/L (ref 15–41)
Albumin: 3.4 g/dL — ABNORMAL LOW (ref 3.5–5.0)
Alkaline Phosphatase: 74 U/L (ref 38–126)
Anion gap: 8 (ref 5–15)
BUN: 10 mg/dL (ref 8–23)
CO2: 26 mmol/L (ref 22–32)
Calcium: 8.7 mg/dL — ABNORMAL LOW (ref 8.9–10.3)
Chloride: 107 mmol/L (ref 98–111)
Creatinine, Ser: 0.77 mg/dL (ref 0.44–1.00)
GFR calc Af Amer: >60 mL/min (ref >60)
GFR calc non Af Amer: >60 mL/min (ref >60)
Glucose, Bld: 119 mg/dL — ABNORMAL HIGH (ref 70–99)
Potassium: 3.9 mmol/L (ref 3.5–5.1)
Sodium: 141 mmol/L (ref 135–145)
Total Bilirubin: 0.4 mg/dL (ref 0.3–1.2)
Total Protein: 6.1 g/dL — ABNORMAL LOW (ref 6.5–8.1)

## 2019-08-21 LAB — CORTISOL: Cortisol, Plasma: 9.5 ug/dL

## 2019-08-21 MED ORDER — SODIUM CHLORIDE 0.9% FLUSH
10.0000 mL | INTRAVENOUS | Status: DC | PRN
  Administered 2019-08-21: 10 mL

## 2019-08-21 MED ORDER — SODIUM CHLORIDE 0.9 % IV SOLN: Freq: Once | INTRAVENOUS | Status: AC

## 2019-08-21 MED ORDER — HEPARIN SOD (PORK) LOCK FLUSH 100 UNIT/ML IV SOLN
500.0000 [IU] | Freq: Once | INTRAVENOUS | Status: AC | PRN
  Administered 2019-08-21: 500 [IU]

## 2019-08-21 MED ORDER — SODIUM CHLORIDE 0.9 % IV SOLN
10.0000 mg/kg | Freq: Once | INTRAVENOUS | Status: AC
  Administered 2019-08-21: 1240 mg via INTRAVENOUS
  Filled 2019-08-21: qty 20

## 2019-08-21 NOTE — Progress Notes (Signed)
[place], [place] [address]   CLINIC:  Medical Oncology/Hematology  PCP:  Iona Beard, [address] / [place] [address] [phone]   REASON FOR VISIT:  Follow-up for stage III left lung adenocarcinoma.  CURRENT THERAPY: consolidation with durvalumab.  BRIEF ONCOLOGIC HISTORY:  Oncology History  Adenocarcinoma of left lung ([place])  12/09/2018 Initial Diagnosis   Adenocarcinoma of left lung ([place])   01/02/2019 Cancer Staging   Staging form: Lung, AJCC 8th Edition - Clinical stage from 01/02/2019: Stage IIIB (cT3, cN2, cM0) - Signed by Derek Jack, MD on 01/02/2019   01/07/2019 - 02/17/2019 Chemotherapy   The patient had palonosetron (ALOXI) injection 0.25 mg, 0.25 mg, Intravenous,  Once, 6 of 6 cycles Administration: 0.25 mg (01/07/2019), 0.25 mg (01/14/2019), 0.25 mg (01/21/2019), 0.25 mg (01/28/2019), 0.25 mg (02/04/2019), 0.25 mg (02/11/2019) CARBOplatin (PARAPLATIN) 270 mg in sodium chloride 0.9 % 250 mL chemo infusion, 270 mg (100 % of original dose 266.4 mg), Intravenous,  Once, 6 of 6 cycles Dose modification:   (original dose 266.4 mg, Cycle 1),   (original dose 266.4 mg, Cycle 2),   (original dose 266.4 mg, Cycle 3),   (original dose 266.4 mg, Cycle 4) Administration: 270 mg (01/07/2019), 270 mg (01/14/2019), 270 mg (01/21/2019), 270 mg (01/28/2019), 270 mg (02/04/2019), 270 mg (02/11/2019) PACLitaxel (TAXOL) 108 mg in sodium chloride 0.9 % 250 mL chemo infusion (</= 80mg /m2), 45 mg/m2 = 108 mg, Intravenous,  Once, 6 of 6 cycles Administration: 108 mg (01/07/2019), 108 mg (01/14/2019), 108 mg (01/21/2019), 108 mg (01/28/2019), 108 mg (02/04/2019), 108 mg (02/11/2019) fosaprepitant (EMEND) 150 mg, dexamethasone (DECADRON) 12 mg in sodium chloride 0.9 % 145 mL IVPB, , Intravenous,  Once, 5 of 5 cycles Administration:  (01/14/2019),  (01/21/2019),  (01/28/2019),  (02/04/2019),  (02/11/2019)  for chemotherapy treatment.      03/19/2019 -  Chemotherapy   The patient had durvalumab (IMFINZI) 1,240 mg in sodium chloride 0.9 % 100 mL chemo infusion, 9.9 mg/kg = 1,260 mg, Intravenous,  Once, 5 of 6 cycles Administration: 1,240 mg (03/19/2019), 1,240 mg (04/02/2019), 1,240 mg (04/16/2019), 1,240 mg (07/24/2019), 1,240 mg (08/07/2019)  for chemotherapy treatment.      CANCER STAGING: Cancer Staging Adenocarcinoma of left lung [facility]) Staging form: Lung, AJCC 8th Edition - Clinical stage from 01/02/2019: Stage IIIB (cT3, cN2, cM0) - Signed by Derek Jack, MD on 01/02/2019   INTERVAL HISTORY:  [name], a 69 y.o. female, returns for routine follow-up and consideration for next cycle of chemotherapy. Julie-Ann was last seen on 08/07/2019.  Due for cycle #6 of durvalumab today.   Overall, she tells me she has been feeling pretty well. She had one recent episode of dizziness w/ the room spinning, but then it resolved when she sat down. She was fine afterwards and started moving again. She denies any position that exacerbates her dizziness and denies headaches. Her SOB is stable as long as she doesn't exert herself. She checks her BP at home and takes her amlodipine daily.   Overall, she feels ready for next cycle of chemo today.    REVIEW OF SYSTEMS:  Review of Systems  Constitutional: Positive for fatigue (moderate). Negative for appetite change.  Respiratory: Positive for shortness of breath.   Neurological: Positive for dizziness. Negative for headaches.  All other systems reviewed and are negative.   PAST MEDICAL/SURGICAL HISTORY:  Past Medical History:  Diagnosis Date  . Anemia   .  Arthritis   . Diabetes mellitus    diet controlled  . GERD (gastroesophageal reflux disease)   . HTN (hypertension)   . HTN (hypertension)   . Port-A-Cath in place 01/06/2019  . Shortness of breath dyspnea    with exertion   Past Surgical History:  Procedure Laterality Date  . CHOLECYSTECTOMY  1997  .  COLONOSCOPY    . GASTRIC BYPASS    . INCISIONAL HERNIA REPAIR  04/11/11  . IR IMAGING GUIDED PORT INSERTION  12/27/2018  . LAPAROSCOPIC SALPINGOOPHERECTOMY    . LAPAROTOMY  04/11/2011   Procedure: EXPLORATORY LAPAROTOMY;  Surgeon: Joyice Faster. Cornett, MD;  Location: [facility];  Service: General;  Laterality: N/A;  closure port hole  . TOTAL HIP ARTHROPLASTY  03/07/2012   Procedure: TOTAL HIP ARTHROPLASTY ANTERIOR APPROACH;  Surgeon: Mauri Pole, MD;  Location: [facility];  Service: Orthopedics;  Laterality: Right;  . TOTAL SHOULDER ARTHROPLASTY Left 01/21/2015  . TOTAL SHOULDER ARTHROPLASTY Left 01/21/2015   Procedure: LEFT TOTAL SHOULDER ARTHROPLASTY;  Surgeon: Justice Britain, MD;  Location: [facility];  Service: Orthopedics;  Laterality: Left;  Marland Kitchen VAGINAL HYSTERECTOMY      SOCIAL HISTORY:  Social History   Socioeconomic History  . Marital status: Single    Spouse name: Not on file  . Number of children: Not on file  . Years of education: 12th grade  . Highest education level: Not on file  Occupational History  . Occupation: Employed    Employer: [employer]  Tobacco Use  . Smoking status: Former Smoker    Packs/day: 1.00    Years: 12.00    Pack years: 12.00    Types: Cigarettes    Quit date: 03/20/1976    Years since quitting: 43.4  . Smokeless tobacco: Never Used  Substance and Sexual Activity  . Alcohol use: No  . Drug use: No  . Sexual activity: Never  Other Topics Concern  . Not on file  Social History Narrative  . Not on file   Social Determinants of Health   Financial Resource Strain: Low Risk   . Difficulty of Paying Living Expenses: Not very hard  Food Insecurity: No Food Insecurity  . Worried About Charity fundraiser in the Last Year: Never true  . Ran Out of Food in the Last Year: Never true  Transportation Needs: No Transportation Needs  . Lack of Transportation (Medical): No  . Lack of Transportation (Non-Medical): No  Physical Activity: Inactive  . Days  of Exercise per Week: 0 days  . Minutes of Exercise per Session: 0 min  Stress: No Stress Concern Present  . Feeling of Stress : Only a little  Social Connections: Somewhat Isolated  . Frequency of Communication with Friends and Family: More than three times a week  . Frequency of Social Gatherings with Friends and Family: Once a week  . Attends Religious Services: More than 4 times per year  . Active Member of Clubs or Organizations: No  . Attends Archivist Meetings: Never  . Marital Status: Never married  Intimate Partner Violence: Not At Risk  . Fear of Current or Ex-Partner: No  . Emotionally Abused: No  . Physically Abused: No  . Sexually Abused: No    FAMILY HISTORY:  Family History  Problem Relation Age of Onset  . Breast cancer Mother   . COPD Mother   . Arthritis Mother   . Diabetes Mother   . Hypertension Mother   . Hypertension Father   .  Diabetes Father   . Breast cancer Sister   . Thyroid cancer Brother   . Huntington's disease Maternal Grandmother   . Heart attack Brother     CURRENT MEDICATIONS:  Current Outpatient Medications  Medication Sig Dispense Refill  . albuterol (VENTOLIN HFA) 108 (90 Base) MCG/ACT inhaler TAKE 2 PUFFS BY MOUTH EVERY 6 HOURS AS NEEDED FOR WHEEZE OR SHORTNESS OF BREATH (Patient not taking: Reported on 08/07/2019) 8 g 6  . ALPRAZolam (XANAX) 0.25 MG tablet Take 0.25 mg by mouth as needed.     Marland Kitchen amLODipine (NORVASC) 5 MG tablet Take 5 mg by mouth every morning.     . Calcium Carbonate Antacid (TUMS PO) Take 4 tablets by mouth daily.     . Cyanocobalamin (VITAMIN B-12) 2500 MCG SUBL Place 2,500 mcg under the tongue every morning.     . diphenhydrAMINE (BENADRYL) 25 MG tablet Take 25 mg by mouth every 6 (six) hours as needed.    [place] IV Inject into the vein every 14 (fourteen) days.    . ferrous sulfate 325 (65 FE) MG tablet Take 1 tablet (325 mg total) by mouth 3 (three) times daily after meals. (Patient taking  differently: Take 325 mg by mouth daily. )    . gabapentin (NEURONTIN) 300 MG capsule Take 300 mg by mouth at bedtime as needed for pain.    Marland Kitchen ibuprofen (ADVIL,MOTRIN) 800 MG tablet Take 1 tablet (800 mg total) by mouth 3 (three) times daily. 90 tablet 2  . Multiple Vitamin (MULITIVITAMIN WITH MINERALS) TABS Take 1 tablet by mouth every morning.     . naproxen (NAPROSYN) 500 MG tablet Take 500 mg by mouth as needed.     Marland Kitchen omeprazole (PRILOSEC) 20 MG capsule Take 20 mg by mouth daily.     No current facility-administered medications for this visit.    ALLERGIES:  No Known Allergies  PHYSICAL EXAM:  Performance status (ECOG): 1 - Symptomatic but completely ambulatory  There were no vitals filed for this visit. Wt Readings from Last 3 Encounters:  08/07/19 286 lb 8 oz (130 kg)  07/24/19 285 lb 1.6 oz (129.3 kg)  07/22/19 287 lb 14.4 oz (130.6 kg)   Physical Exam Vitals reviewed.  Constitutional:      Appearance: Normal appearance.  Cardiovascular:     Rate and Rhythm: Normal rate and regular rhythm.  Pulmonary:     Effort: Pulmonary effort is normal.     Breath sounds: Normal breath sounds.  Neurological:     General: No focal deficit present.     Mental Status: She is alert and oriented to person, place, and time.  Psychiatric:        Mood and Affect: Mood normal.        Behavior: Behavior normal.     LABORATORY DATA:  I have reviewed the labs as listed.  CBC Latest Ref Rng & Units 08/07/2019 07/24/2019 07/01/2019  WBC 4.0 - 10.5 K/uL 4.4 5.1 10.1  Hemoglobin 12.0 - 15.0 g/dL 11.5(L) 11.5(L) 12.3  Hematocrit 36.0 - 46.0 % 38.4 37.5 41.7  Platelets 150 - 400 K/uL 340 323 341   CMP Latest Ref Rng & Units 08/07/2019 07/24/2019 07/01/2019  Glucose 70 - 99 mg/dL 115(H) 107(H) 111(H)  BUN 8 - 23 mg/dL 12 12 15   Creatinine 0.44 - 1.00 mg/dL 0.69 0.75 0.86  Sodium 135 - 145 mmol/L 141 143 142  Potassium 3.5 - 5.1 mmol/L 4.0 3.9 4.6  Chloride 98 - 111 mmol/L  105 110 106  CO2 22 -  32 mmol/L 27 25 27   Calcium 8.9 - 10.3 mg/dL 9.0 8.7(L) 9.0  Total Protein 6.5 - 8.1 g/dL 6.3(L) 6.2(L) 6.6  Total Bilirubin 0.3 - 1.2 mg/dL 0.5 0.4 0.4  Alkaline Phos 38 - 126 U/L 87 79 97  AST 15 - 41 U/L 15 18 21   ALT 0 - 44 U/L 15 18 34    DIAGNOSTIC IMAGING:  I have independently reviewed the scans and discussed with the patient. No results found.   ASSESSMENT:  Adenocarcinoma of left lung ([place]) -Chemoradiation therapy with carboplatin and paclitaxel from 01/07/2019 through 02/11/2019. -Consolidation immunotherapy with durvalumab from 03/19/2019 through 04/15/2018, held due to pneumonitis. -CT chest on 07/02/2019 shows left upper lobe lung mass measuring 2.6 x 2.0 cm.  It shows improvement in size. -MRI of the brain on 07/18/2019 showed left sphenoid wing meningioma measuring 2.6 x 2.0 x 2.6 cm unchanged.  No new enhancing intracranial lesion.  Increased left temporal white matter edema.    PLAN:  1.  Clinical stage IIIb (T3N2) adenocarcinoma of the lung: -Durvalumab restarted on 07/24/2019. -No worsening of shortness of breath since last treatment. -I reviewed her labs.  They are adequate to proceed with next cycle of durvalumab.  I plan to see her back in 2 weeks.  At that time we will set her up for CT scans in 4 weeks.   2.  Shortness of breath on exertion: -She complains of continued dyspnea on exertion.  We have made a referral to pulmonary.  3.  Radiation pneumonitis: -Prednisone 60 mg started on 05/19/2019, gradually tapered off on 07/21/2019. -CT chest on 07/02/2019 showed improved pneumonitis.  4.  Meningioma: -She does not have any left hemifacial pain.  She was recommended watchful waiting.  5.  Dizziness: -She reported dizziness in the last few days.  This is mainly when she gets up to stand and happen mainly at work.  It happened about 4-5 times till now. -I have recommended her to check her blood pressure in the morning prior to taking Norvasc.  She was  instructed not to take it if the blood pressure is in the normal range. -We will also check a cortisol level.    Orders placed this encounter:  No orders of the defined types were placed in this encounter.    Derek Jack, MD [facility] [phone]   I, Jacqualyn Posey, am acting as a scribe for Dr. Sanda Linger.  I, Derek Jack MD, have reviewed the above documentation for accuracy and completeness, and I agree with the above.

## 2019-08-21 NOTE — Patient Instructions (Signed)
[place] at [facility] Discharge Instructions  You were seen today by Dr. Delton Coombes. He went over your recent results. Dr. Delton Coombes wants you to continue monitoring your blood pressure at home daily. If your systolic blood pressure drops below 120, do not take your amlodipine. He will see you back in 2 weeks for labs and follow up.   Thank you for choosing [place] at [facility] to provide your oncology and hematology care.  To afford each patient quality time with our provider, please arrive at least 15 minutes before your scheduled appointment time.   If you have a lab appointment with the [facility] please come in thru the  Main Entrance and check in at the main information desk  You need to re-schedule your appointment should you arrive 10 or more minutes late.  We strive to give you quality time with our providers, and arriving late affects you and other patients whose appointments are after yours.  Also, if you no show three or more times for appointments you may be dismissed from the clinic at the providers discretion.     Again, thank you for choosing [facility].  Our hope is that these requests will decrease the amount of time that you wait before being seen by our physicians.       _____________________________________________________________  Should you have questions after your visit to [facility], please contact our office at [phone] between the hours of 8:00 a.m. and 4:30 p.m.  Voicemails left after 4:00 p.m. will not be returned until the following business day.  For prescription refill requests, have your pharmacy contact our office and allow 72 hours.    Cancer Center Support Programs:   > Cancer Support Group  2nd Tuesday of the month 1pm-2pm, Journey Room

## 2019-08-21 NOTE — Patient Instructions (Signed)
[facility] at [facility] Discharge Instructions  Labs drawn from portacath today   Thank you for choosing [facility] at [facility] to provide your oncology and hematology care.  To afford each patient quality time with our provider, please arrive at least 15 minutes before your scheduled appointment time.   If you have a lab appointment with the Cancer Center please come in thru the Main Entrance and check in at the main information desk.  You need to re-schedule your appointment should you arrive 10 or more minutes late.  We strive to give you quality time with our providers, and arriving late affects you and other patients whose appointments are after yours.  Also, if you no show three or more times for appointments you may be dismissed from the clinic at the providers discretion.     Again, thank you for choosing [facility].  Our hope is that these requests will decrease the amount of time that you wait before being seen by our physicians.       _____________________________________________________________  Should you have questions after your visit to [facility], please contact our office at [phone] between the hours of 8:00 a.m. and 4:30 p.m.  Voicemails left after 4:00 p.m. will not be returned until the following business day.  For prescription refill requests, have your pharmacy contact our office and allow 72 hours.    Due to Covid, you will need to wear a mask upon entering the hospital. If you do not have a mask, a mask will be given to you at the Main Entrance upon arrival. For doctor visits, patients may have 1 support person with them. For treatment visits, patients can not have anyone with them due to social distancing guidelines and our immunocompromised population.     

## 2019-08-21 NOTE — Progress Notes (Signed)
Patient has been assessed, vital signs and labs have been reviewed by Dr. Katragadda. ANC, Creatinine, LFTs, and Platelets are within treatment parameters per Dr. Katragadda. The patient is good to proceed with treatment at this time.  

## 2019-08-21 NOTE — Patient Instructions (Signed)
[facility] Discharge Instructions for Patients Receiving Chemotherapy   Beginning January 23rd 2017 lab work for the [facility] will be done in the  Main lab at [facility] on 1st floor. If you have a lab appointment with the [facility] please come in thru the  Main Entrance and check in at the main information desk   Today you received the following chemotherapy agents Imfinzi. Follow-up as scheduled  To help prevent nausea and vomiting after your treatment, we encourage you to take your nausea medication   If you develop nausea and vomiting, or diarrhea that is not controlled by your medication, call the clinic.  The clinic phone number is [phone]. Office hours are Monday-Friday 8:30am-5:00pm.  BELOW ARE SYMPTOMS THAT SHOULD BE REPORTED IMMEDIATELY:  *FEVER GREATER THAN 101.0 F  *CHILLS WITH OR WITHOUT FEVER  NAUSEA AND VOMITING THAT IS NOT CONTROLLED WITH YOUR NAUSEA MEDICATION  *UNUSUAL SHORTNESS OF BREATH  *UNUSUAL BRUISING OR BLEEDING  TENDERNESS IN MOUTH AND THROAT WITH OR WITHOUT PRESENCE OF ULCERS  *URINARY PROBLEMS  *BOWEL PROBLEMS  UNUSUAL RASH Items with * indicate a potential emergency and should be followed up as soon as possible. If you have an emergency after office hours please contact your primary care physician or go to the nearest emergency department.  Please call the clinic during office hours if you have any questions or concerns.   You may also contact the Patient Navigator at [phone] should you have any questions or need assistance in obtaining follow up care.      Resources For Cancer Patients and their Caregivers ? American Cancer Society: Can assist with transportation, wigs, general needs, runs Look Good Feel Better.        (619)192-5665 ? Cancer Care: Provides financial assistance, online support groups, medication/co-pay assistance.  1-800-813-HOPE (414) 392-4687) ? [place] Assists [place] cancer patients and their families through emotional , educational and financial support.  612-256-1177 ? [place] DSS Where to apply for food stamps, Medicaid and utility assistance. (773) 110-0587 ? [employer]: Transportation to medical appointments. 862-723-8918 ? Social Security Administration: May apply for disability if have a Stage IV cancer. ([phone] 228-460-0245 ? [employer], Disability and Transit Services: Assists with nutrition, care and transit needs. 9376037471

## 2019-08-21 NOTE — Progress Notes (Signed)
1134 Labs reviewed with and pt seen by Dr. Delton Coombes and pt approved for Imfinzi infusion today per MD                                     [name] tolerated Imfinzi infusion well without complaints or incident. VSS upon discharge. Pt discharged self ambulatory in satisfactory condition

## 2019-09-04 ENCOUNTER — Inpatient Hospital Stay (HOSPITAL_COMMUNITY): Payer: Medicare Other

## 2019-09-04 ENCOUNTER — Inpatient Hospital Stay (HOSPITAL_BASED_OUTPATIENT_CLINIC_OR_DEPARTMENT_OTHER): Payer: Medicare Other | Admitting: Hematology

## 2019-09-04 ENCOUNTER — Other Ambulatory Visit: Payer: Self-pay

## 2019-09-04 VITALS — BP 118/51 | HR 88 | Temp 98.4°F | Resp 20 | Wt 292.2 lb

## 2019-09-04 DIAGNOSIS — Z5112 Encounter for antineoplastic immunotherapy: Secondary | ICD-10-CM | POA: Diagnosis not present

## 2019-09-04 DIAGNOSIS — C3492 Malignant neoplasm of unspecified part of left bronchus or lung: Secondary | ICD-10-CM

## 2019-09-04 DIAGNOSIS — Z95828 Presence of other vascular implants and grafts: Secondary | ICD-10-CM

## 2019-09-04 LAB — CBC WITH DIFFERENTIAL/PLATELET
Abs Immature Granulocytes: 0.01 10*3/uL (ref 0.00–0.07)
Basophils Absolute: 0 10*3/uL (ref 0.0–0.1)
Basophils Relative: 1 %
Eosinophils Absolute: 0.2 10*3/uL (ref 0.0–0.5)
Eosinophils Relative: 4 %
HCT: 36.7 % (ref 36.0–46.0)
Hemoglobin: 11.3 g/dL — ABNORMAL LOW (ref 12.0–15.0)
Immature Granulocytes: 0 %
Lymphocytes Relative: 13 %
Lymphs Abs: 0.6 10*3/uL — ABNORMAL LOW (ref 0.7–4.0)
MCH: 26.9 pg (ref 26.0–34.0)
MCHC: 30.8 g/dL (ref 30.0–36.0)
MCV: 87.4 fL (ref 80.0–100.0)
Monocytes Absolute: 0.4 10*3/uL (ref 0.1–1.0)
Monocytes Relative: 9 %
Neutro Abs: 3.1 10*3/uL (ref 1.7–7.7)
Neutrophils Relative %: 73 %
Platelets: 321 10*3/uL (ref 150–400)
RBC: 4.2 MIL/uL (ref 3.87–5.11)
RDW: 15.4 % (ref 11.5–15.5)
WBC: 4.3 10*3/uL (ref 4.0–10.5)
nRBC: 0 % (ref 0.0–0.2)

## 2019-09-04 LAB — COMPREHENSIVE METABOLIC PANEL
ALT: 18 U/L (ref 0–44)
AST: 17 U/L (ref 15–41)
Albumin: 3.4 g/dL — ABNORMAL LOW (ref 3.5–5.0)
Alkaline Phosphatase: 77 U/L (ref 38–126)
Anion gap: 8 (ref 5–15)
BUN: 14 mg/dL (ref 8–23)
CO2: 27 mmol/L (ref 22–32)
Calcium: 8.7 mg/dL — ABNORMAL LOW (ref 8.9–10.3)
Chloride: 106 mmol/L (ref 98–111)
Creatinine, Ser: 0.68 mg/dL (ref 0.44–1.00)
GFR calc Af Amer: >60 mL/min (ref >60)
GFR calc non Af Amer: >60 mL/min (ref >60)
Glucose, Bld: 114 mg/dL — ABNORMAL HIGH (ref 70–99)
Potassium: 3.7 mmol/L (ref 3.5–5.1)
Sodium: 141 mmol/L (ref 135–145)
Total Bilirubin: 0.4 mg/dL (ref 0.3–1.2)
Total Protein: 6.1 g/dL — ABNORMAL LOW (ref 6.5–8.1)

## 2019-09-04 MED ORDER — HEPARIN SOD (PORK) LOCK FLUSH 100 UNIT/ML IV SOLN
500.0000 [IU] | Freq: Once | INTRAVENOUS | Status: AC | PRN
  Administered 2019-09-04: 500 [IU]

## 2019-09-04 MED ORDER — SODIUM CHLORIDE 0.9% FLUSH
10.0000 mL | INTRAVENOUS | Status: DC | PRN
  Administered 2019-09-04: 10 mL via INTRAVENOUS

## 2019-09-04 MED ORDER — SODIUM CHLORIDE 0.9% FLUSH
10.0000 mL | INTRAVENOUS | Status: DC | PRN
  Administered 2019-09-04: 10 mL

## 2019-09-04 MED ORDER — SODIUM CHLORIDE 0.9 % IV SOLN: Freq: Once | INTRAVENOUS | Status: AC

## 2019-09-04 MED ORDER — SODIUM CHLORIDE 0.9 % IV SOLN
10.0000 mg/kg | Freq: Once | INTRAVENOUS | Status: AC
  Administered 2019-09-04: 1240 mg via INTRAVENOUS
  Filled 2019-09-04: qty 4.8

## 2019-09-04 NOTE — Patient Instructions (Signed)
[facility] Discharge Instructions for Patients Receiving Chemotherapy   Beginning January 23rd 2017 lab work for the [facility] will be done in the  Main lab at [facility] on 1st floor. If you have a lab appointment with the [facility] please come in thru the  Main Entrance and check in at the main information desk   Today you received the following chemotherapy agents Imfinzi. Follow-up as scheduled  To help prevent nausea and vomiting after your treatment, we encourage you to take your nausea medication   If you develop nausea and vomiting, or diarrhea that is not controlled by your medication, call the clinic.  The clinic phone number is [phone]. Office hours are Monday-Friday 8:30am-5:00pm.  BELOW ARE SYMPTOMS THAT SHOULD BE REPORTED IMMEDIATELY:  *FEVER GREATER THAN 101.0 F  *CHILLS WITH OR WITHOUT FEVER  NAUSEA AND VOMITING THAT IS NOT CONTROLLED WITH YOUR NAUSEA MEDICATION  *UNUSUAL SHORTNESS OF BREATH  *UNUSUAL BRUISING OR BLEEDING  TENDERNESS IN MOUTH AND THROAT WITH OR WITHOUT PRESENCE OF ULCERS  *URINARY PROBLEMS  *BOWEL PROBLEMS  UNUSUAL RASH Items with * indicate a potential emergency and should be followed up as soon as possible. If you have an emergency after office hours please contact your primary care physician or go to the nearest emergency department.  Please call the clinic during office hours if you have any questions or concerns.   You may also contact the Patient Navigator at [phone] should you have any questions or need assistance in obtaining follow up care.      Resources For Cancer Patients and their Caregivers ? American Cancer Society: Can assist with transportation, wigs, general needs, runs Look Good Feel Better.        (804)275-8666 ? Cancer Care: Provides financial assistance, online support groups, medication/co-pay assistance.  1-800-813-HOPE 639-100-5507) ? [facility] Assists [place] cancer patients and their families through emotional , educational and financial support.  (817)617-2962 ? [place] DSS Where to apply for food stamps, Medicaid and utility assistance. 253-401-1541 ? RCATS: Transportation to medical appointments. 431-133-0636 ? Social Security Administration: May apply for disability if have a Stage IV cancer. 613 472 1171 769-724-0779 ? [employer], Disability and Transit Services: Assists with nutrition, care and transit needs. 815-394-8382

## 2019-09-04 NOTE — Progress Notes (Signed)
Patient has been assessed, vital signs and labs have been reviewed by Dr. Katragadda. ANC, Creatinine, LFTs, and Platelets are within treatment parameters per Dr. Katragadda. The patient is good to proceed with treatment at this time.  

## 2019-09-04 NOTE — Progress Notes (Signed)
1112 Labs reviewed with and pt seen by Dr. Delton Coombes and pt approved for Imfinzi infusion today per MD                                     [name] tolerated Imfinzi infusion well without complaints or incident. VSS upon discharge. Pt discharged self ambulatory in satisfactory condition

## 2019-09-04 NOTE — Patient Instructions (Signed)
[place] at [facility] Discharge Instructions  You were seen today by Dr. Delton Coombes. He went over your recent results. You will receive treatment today and in 2 weeks. You will be scheduled for a CT scan of your chest. Dr. Delton Coombes will see you back in 4 weeks for labs and follow up.   Thank you for choosing [place] at [facility] to provide your oncology and hematology care.  To afford each patient quality time with our provider, please arrive at least 15 minutes before your scheduled appointment time.   If you have a lab appointment with the [facility] please come in thru the Main Entrance and check in at the main information desk  You need to re-schedule your appointment should you arrive 10 or more minutes late.  We strive to give you quality time with our providers, and arriving late affects you and other patients whose appointments are after yours.  Also, if you no show three or more times for appointments you may be dismissed from the clinic at the providers discretion.     Again, thank you for choosing [facility].  Our hope is that these requests will decrease the amount of time that you wait before being seen by our physicians.       _____________________________________________________________  Should you have questions after your visit to [facility], please contact our office at [phone] between the hours of 8:00 a.m. and 4:30 p.m.  Voicemails left after 4:00 p.m. will not be returned until the following business day.  For prescription refill requests, have your pharmacy contact our office and allow 72 hours.    Cancer Center Support Programs:   > Cancer Support Group  2nd Tuesday of the month 1pm-2pm, Journey Room

## 2019-09-04 NOTE — Progress Notes (Signed)
[place], [place] [address]   CLINIC:  Medical Oncology/Hematology  PCP:  Iona Beard, [address] / [place] [address] [phone]   REASON FOR VISIT:  Follow-up for stage III left lung adenocarcinoma  CURRENT THERAPY: Consolidation with durvalumab.  BRIEF ONCOLOGIC HISTORY:  Oncology History  Adenocarcinoma of left lung ([facility])  12/09/2018 Initial Diagnosis   Adenocarcinoma of left lung ([place])   01/02/2019 Cancer Staging   Staging form: Lung, AJCC 8th Edition - Clinical stage from 01/02/2019: Stage IIIB (cT3, cN2, cM0) - Signed by Derek Jack, MD on 01/02/2019   01/07/2019 - 02/17/2019 Chemotherapy   The patient had palonosetron (ALOXI) injection 0.25 mg, 0.25 mg, Intravenous,  Once, 6 of 6 cycles Administration: 0.25 mg (01/07/2019), 0.25 mg (01/14/2019), 0.25 mg (01/21/2019), 0.25 mg (01/28/2019), 0.25 mg (02/04/2019), 0.25 mg (02/11/2019) CARBOplatin (PARAPLATIN) 270 mg in sodium chloride 0.9 % 250 mL chemo infusion, 270 mg (100 % of original dose 266.4 mg), Intravenous,  Once, 6 of 6 cycles Dose modification:   (original dose 266.4 mg, Cycle 1),   (original dose 266.4 mg, Cycle 2),   (original dose 266.4 mg, Cycle 3),   (original dose 266.4 mg, Cycle 4) Administration: 270 mg (01/07/2019), 270 mg (01/14/2019), 270 mg (01/21/2019), 270 mg (01/28/2019), 270 mg (02/04/2019), 270 mg (02/11/2019) PACLitaxel (TAXOL) 108 mg in sodium chloride 0.9 % 250 mL chemo infusion (</= 80mg /m2), 45 mg/m2 = 108 mg, Intravenous,  Once, 6 of 6 cycles Administration: 108 mg (01/07/2019), 108 mg (01/14/2019), 108 mg (01/21/2019), 108 mg (01/28/2019), 108 mg (02/04/2019), 108 mg (02/11/2019) fosaprepitant (EMEND) 150 mg, dexamethasone (DECADRON) 12 mg in sodium chloride 0.9 % 145 mL IVPB, , Intravenous,  Once, 5 of 5 cycles Administration:  (01/14/2019),  (01/21/2019),  (01/28/2019),  (02/04/2019),  (02/11/2019)  for chemotherapy treatment.     03/19/2019 -  Chemotherapy   The patient had durvalumab (IMFINZI) 1,240 mg in sodium chloride 0.9 % 100 mL chemo infusion, 9.9 mg/kg = 1,260 mg, Intravenous,  Once, 6 of 9 cycles Administration: 1,240 mg (03/19/2019), 1,240 mg (04/02/2019), 1,240 mg (04/16/2019), 1,240 mg (07/24/2019), 1,240 mg (08/07/2019), 1,240 mg (08/21/2019)  for chemotherapy treatment.      CANCER STAGING: Cancer Staging Adenocarcinoma of left lung [facility]) Staging form: Lung, AJCC 8th Edition - Clinical stage from 01/02/2019: Stage IIIB (cT3, cN2, cM0) - Signed by Derek Jack, MD on 01/02/2019   INTERVAL HISTORY:  [name], a 69 y.o. female, returns for routine follow-up and consideration for next cycle of chemotherapy. [name] was last seen on 08/21/2019.  Due for cycle #7 of durvalumab today.   Overall, she tells me she has been feeling pretty well. She is tolerating the treatments well. She is still getting SOB with exertion. Her dizziness is improving. She denies N/V/D or skin rashes or headaches.  Overall, she feels ready for next cycle of chemo today.    REVIEW OF SYSTEMS:  Review of Systems  Constitutional: Positive for fatigue. Negative for appetite change.  Respiratory: Positive for shortness of breath (w/ exertion).   Gastrointestinal: Negative for diarrhea, nausea and vomiting.  Skin: Negative for rash.  Neurological: Negative for headaches.  All other systems reviewed and are negative.   PAST MEDICAL/SURGICAL HISTORY:  Past Medical History:  Diagnosis Date  . Anemia   . Arthritis   . Diabetes mellitus    diet controlled  . GERD (gastroesophageal reflux disease)   . HTN (hypertension)   .  HTN (hypertension)   . Port-A-Cath in place 01/06/2019  . Shortness of breath dyspnea    with exertion   Past Surgical History:  Procedure Laterality Date  . CHOLECYSTECTOMY  1997  . COLONOSCOPY    . GASTRIC BYPASS    . INCISIONAL HERNIA REPAIR  04/11/11  . IR IMAGING GUIDED PORT INSERTION   12/27/2018  . LAPAROSCOPIC SALPINGOOPHERECTOMY    . LAPAROTOMY  04/11/2011   Procedure: EXPLORATORY LAPAROTOMY;  Surgeon: Joyice Faster. Cornett, MD;  Location: [facility];  Service: General;  Laterality: N/A;  closure port hole  . TOTAL HIP ARTHROPLASTY  03/07/2012   Procedure: TOTAL HIP ARTHROPLASTY ANTERIOR APPROACH;  Surgeon: Mauri Pole, MD;  Location: [facility];  Service: Orthopedics;  Laterality: Right;  . TOTAL SHOULDER ARTHROPLASTY Left 01/21/2015  . TOTAL SHOULDER ARTHROPLASTY Left 01/21/2015   Procedure: LEFT TOTAL SHOULDER ARTHROPLASTY;  Surgeon: Justice Britain, MD;  Location: [facility];  Service: Orthopedics;  Laterality: Left;  Marland Kitchen VAGINAL HYSTERECTOMY      SOCIAL HISTORY:  Social History   Socioeconomic History  . Marital status: Single    Spouse name: Not on file  . Number of children: Not on file  . Years of education: 12th grade  . Highest education level: Not on file  Occupational History  . Occupation: Employed    Employer: [employer]  Tobacco Use  . Smoking status: Former Smoker    Packs/day: 1.00    Years: 12.00    Pack years: 12.00    Types: Cigarettes    Quit date: 03/20/1976    Years since quitting: 43.4  . Smokeless tobacco: Never Used  Vaping Use  . Vaping Use: Never used  Substance and Sexual Activity  . Alcohol use: No  . Drug use: No  . Sexual activity: Never  Other Topics Concern  . Not on file  Social History Narrative  . Not on file   Social Determinants of Health   Financial Resource Strain: Low Risk   . Difficulty of Paying Living Expenses: Not very hard  Food Insecurity: No Food Insecurity  . Worried About Charity fundraiser in the Last Year: Never true  . Ran Out of Food in the Last Year: Never true  Transportation Needs: No Transportation Needs  . Lack of Transportation (Medical): No  . Lack of Transportation (Non-Medical): No  Physical Activity: Inactive  . Days of Exercise per Week: 0 days  . Minutes of Exercise per Session: 0  min  Stress: No Stress Concern Present  . Feeling of Stress : Only a little  Social Connections: Moderately Isolated  . Frequency of Communication with Friends and Family: More than three times a week  . Frequency of Social Gatherings with Friends and Family: Once a week  . Attends Religious Services: More than 4 times per year  . Active Member of Clubs or Organizations: No  . Attends Archivist Meetings: Never  . Marital Status: Never married  Intimate Partner Violence: Not At Risk  . Fear of Current or Ex-Partner: No  . Emotionally Abused: No  . Physically Abused: No  . Sexually Abused: No    FAMILY HISTORY:  Family History  Problem Relation Age of Onset  . Breast cancer Mother   . COPD Mother   . Arthritis Mother   . Diabetes Mother   . Hypertension Mother   . Hypertension Father   . Diabetes Father   . Breast cancer Sister   . Thyroid cancer  Brother   . Huntington's disease Maternal Grandmother   . Heart attack Brother     CURRENT MEDICATIONS:  Current Outpatient Medications  Medication Sig Dispense Refill  . amLODipine (NORVASC) 5 MG tablet Take 5 mg by mouth every morning.     . Calcium Carbonate Antacid (TUMS PO) Take 4 tablets by mouth daily.     . Cyanocobalamin (VITAMIN B-12) 2500 MCG SUBL Place 2,500 mcg under the tongue every morning.     Hunt Oris IV Inject into the vein every 14 (fourteen) days.    . ferrous sulfate 325 (65 FE) MG tablet Take 1 tablet (325 mg total) by mouth 3 (three) times daily after meals. (Patient taking differently: Take 325 mg by mouth daily. )    . ibuprofen (ADVIL,MOTRIN) 800 MG tablet Take 1 tablet (800 mg total) by mouth 3 (three) times daily. 90 tablet 2  . Multiple Vitamin (MULITIVITAMIN WITH MINERALS) TABS Take 1 tablet by mouth every morning.     Marland Kitchen omeprazole (PRILOSEC) 20 MG capsule Take 20 mg by mouth daily.    Marland Kitchen albuterol (VENTOLIN HFA) 108 (90 Base) MCG/ACT inhaler TAKE 2 PUFFS BY MOUTH EVERY 6 HOURS AS NEEDED  FOR WHEEZE OR SHORTNESS OF BREATH (Patient not taking: Reported on 09/04/2019) 8 g 6  . ALPRAZolam (XANAX) 0.25 MG tablet Take 0.25 mg by mouth as needed.  (Patient not taking: Reported on 09/04/2019)    . diphenhydrAMINE (BENADRYL) 25 MG tablet Take 25 mg by mouth every 6 (six) hours as needed. (Patient not taking: Reported on 09/04/2019)    . gabapentin (NEURONTIN) 300 MG capsule Take 300 mg by mouth at bedtime as needed for pain. (Patient not taking: Reported on 09/04/2019)    . naproxen (NAPROSYN) 500 MG tablet Take 500 mg by mouth as needed.  (Patient not taking: Reported on 09/04/2019)     No current facility-administered medications for this visit.    ALLERGIES:  No Known Allergies  PHYSICAL EXAM:  Performance status (ECOG): 1 - Symptomatic but completely ambulatory  There were no vitals filed for this visit. Wt Readings from Last 3 Encounters:  09/04/19 292 lb 3.2 oz (132.5 kg)  08/21/19 290 lb 3.2 oz (131.6 kg)  08/07/19 286 lb 8 oz (130 kg)   Physical Exam Vitals reviewed.  Constitutional:      Appearance: Normal appearance. She is obese.  Cardiovascular:     Rate and Rhythm: Normal rate and regular rhythm.     Pulses: Normal pulses.     Heart sounds: Normal heart sounds.  Pulmonary:     Effort: Pulmonary effort is normal.     Breath sounds: Normal breath sounds.  Abdominal:     Palpations: Abdomen is soft.     Tenderness: There is no abdominal tenderness.  Musculoskeletal:     Right hand: Swelling present.     Left hand: Swelling present.     Right lower leg: Edema (1+) present.     Left lower leg: Edema (1+) present.  Neurological:     General: No focal deficit present.     Mental Status: She is alert and oriented to person, place, and time.  Psychiatric:        Mood and Affect: Mood normal.        Behavior: Behavior normal.     LABORATORY DATA:  I have reviewed the labs as listed.  CBC Latest Ref Rng & Units 09/04/2019 08/21/2019 08/07/2019  WBC 4.0 - 10.5 K/uL  4.3 4.8 4.4  Hemoglobin 12.0 - 15.0 g/dL 11.3(L) 11.4(L) 11.5(L)  Hematocrit 36 - 46 % 36.7 37.6 38.4  Platelets 150 - 400 K/uL 321 310 340   CMP Latest Ref Rng & Units 09/04/2019 08/21/2019 08/07/2019  Glucose 70 - 99 mg/dL 114(H) 119(H) 115(H)  BUN 8 - 23 mg/dL 14 10 12   Creatinine 0.44 - 1.00 mg/dL 0.68 0.77 0.69  Sodium 135 - 145 mmol/L 141 141 141  Potassium 3.5 - 5.1 mmol/L 3.7 3.9 4.0  Chloride 98 - 111 mmol/L 106 107 105  CO2 22 - 32 mmol/L 27 26 27   Calcium 8.9 - 10.3 mg/dL 8.7(L) 8.7(L) 9.0  Total Protein 6.5 - 8.1 g/dL 6.1(L) 6.1(L) 6.3(L)  Total Bilirubin 0.3 - 1.2 mg/dL 0.4 0.4 0.5  Alkaline Phos 38 - 126 U/L 77 74 87  AST 15 - 41 U/L 17 17 15   ALT 0 - 44 U/L 18 15 15     DIAGNOSTIC IMAGING:  I have independently reviewed the scans and discussed with the patient. No results found.   ASSESSMENT:  1.  Adenocarcinoma of left lung ([facility]) -Chemoradiation therapy with carboplatin and paclitaxel from 01/07/2019 through 02/11/2019. -Consolidation immunotherapy with durvalumab from 03/19/2019 through 04/15/2018, held due to pneumonitis. -CT chest on 07/02/2019 shows left upper lobe lung mass measuring 2.6 x 2.0 cm. It shows improvement in size. -MRI of the brain on 07/18/2019 showed left sphenoid wing meningioma measuring 2.6 x 2.0 x 2.6 cm unchanged. No new enhancing intracranial lesion. Increased left temporal white matter edema. -Durvalumab restarted on 07/24/2019.   PLAN:  1.  Clinical stage IIIb (T3N2) adenocarcinoma of the lung: -She has been tolerating durvalumab reasonably well.  No immunotherapy related side effects. -She continues to have dyspnea on exertion. -I have reviewed her labs.  LFTs and CBC is normal. -We will proceed with durvalumab today and in 2 weeks.  I plan to see her back in 4 weeks with repeat CT scan of the chest.   2.  Shortness of breath on exertion: -She continues to have dyspnea on exertion.  We have made a referral to pulmonary.  She will be  seeing them in July.  3.  Radiation pneumonitis: -Prednisone 60 mg started on 05/19/2019 tapered off on 07/21/2019.  4.  Meningioma: -She does not have any left hemifacial pain.  She was recommended watchful waiting.  5.  Dizziness: -She reports that dizziness  resolved in 2 weeks.  She is continuing Norvasc with good blood pressure control.   Orders placed this encounter:  No orders of the defined types were placed in this encounter.    Derek Jack, MD [place] [phone]   I, Milinda Antis, am acting as a scribe for Dr. Sanda Linger.  I, Derek Jack MD, have reviewed the above documentation for accuracy and completeness, and I agree with the above.

## 2019-09-19 ENCOUNTER — Inpatient Hospital Stay (HOSPITAL_COMMUNITY): Payer: Medicare Other | Attending: Hematology

## 2019-09-19 ENCOUNTER — Encounter (HOSPITAL_COMMUNITY): Payer: Self-pay

## 2019-09-19 ENCOUNTER — Inpatient Hospital Stay (HOSPITAL_COMMUNITY): Payer: Medicare Other

## 2019-09-19 VITALS — BP 133/73 | HR 99 | Temp 97.9°F | Resp 19

## 2019-09-19 DIAGNOSIS — Z5112 Encounter for antineoplastic immunotherapy: Secondary | ICD-10-CM | POA: Diagnosis not present

## 2019-09-19 DIAGNOSIS — I10 Essential (primary) hypertension: Secondary | ICD-10-CM | POA: Diagnosis not present

## 2019-09-19 DIAGNOSIS — Z79899 Other long term (current) drug therapy: Secondary | ICD-10-CM | POA: Diagnosis not present

## 2019-09-19 DIAGNOSIS — C3492 Malignant neoplasm of unspecified part of left bronchus or lung: Secondary | ICD-10-CM

## 2019-09-19 DIAGNOSIS — C3412 Malignant neoplasm of upper lobe, left bronchus or lung: Secondary | ICD-10-CM | POA: Diagnosis not present

## 2019-09-19 DIAGNOSIS — Z95828 Presence of other vascular implants and grafts: Secondary | ICD-10-CM

## 2019-09-19 DIAGNOSIS — E119 Type 2 diabetes mellitus without complications: Secondary | ICD-10-CM | POA: Diagnosis not present

## 2019-09-19 LAB — COMPREHENSIVE METABOLIC PANEL
ALT: 18 U/L (ref 0–44)
AST: 17 U/L (ref 15–41)
Albumin: 3.6 g/dL (ref 3.5–5.0)
Alkaline Phosphatase: 79 U/L (ref 38–126)
Anion gap: 9 (ref 5–15)
BUN: 16 mg/dL (ref 8–23)
CO2: 26 mmol/L (ref 22–32)
Calcium: 8.9 mg/dL (ref 8.9–10.3)
Chloride: 106 mmol/L (ref 98–111)
Creatinine, Ser: 0.7 mg/dL (ref 0.44–1.00)
GFR calc Af Amer: >60 mL/min (ref >60)
GFR calc non Af Amer: >60 mL/min (ref >60)
Glucose, Bld: 113 mg/dL — ABNORMAL HIGH (ref 70–99)
Potassium: 4 mmol/L (ref 3.5–5.1)
Sodium: 141 mmol/L (ref 135–145)
Total Bilirubin: 0.4 mg/dL (ref 0.3–1.2)
Total Protein: 6.3 g/dL — ABNORMAL LOW (ref 6.5–8.1)

## 2019-09-19 LAB — CBC WITH DIFFERENTIAL/PLATELET
Abs Immature Granulocytes: 0 10*3/uL (ref 0.00–0.07)
Basophils Absolute: 0 10*3/uL (ref 0.0–0.1)
Basophils Relative: 1 %
Eosinophils Absolute: 0.1 10*3/uL (ref 0.0–0.5)
Eosinophils Relative: 3 %
HCT: 36.9 % (ref 36.0–46.0)
Hemoglobin: 11.4 g/dL — ABNORMAL LOW (ref 12.0–15.0)
Immature Granulocytes: 0 %
Lymphocytes Relative: 13 %
Lymphs Abs: 0.5 10*3/uL — ABNORMAL LOW (ref 0.7–4.0)
MCH: 27.3 pg (ref 26.0–34.0)
MCHC: 30.9 g/dL (ref 30.0–36.0)
MCV: 88.3 fL (ref 80.0–100.0)
Monocytes Absolute: 0.3 10*3/uL (ref 0.1–1.0)
Monocytes Relative: 8 %
Neutro Abs: 3.1 10*3/uL (ref 1.7–7.7)
Neutrophils Relative %: 75 %
Platelets: 314 10*3/uL (ref 150–400)
RBC: 4.18 MIL/uL (ref 3.87–5.11)
RDW: 15 % (ref 11.5–15.5)
WBC: 4.1 10*3/uL (ref 4.0–10.5)
nRBC: 0 % (ref 0.0–0.2)

## 2019-09-19 LAB — TSH: TSH: 1.36 u[IU]/mL (ref 0.350–4.500)

## 2019-09-19 MED ORDER — SODIUM CHLORIDE 0.9 % IV SOLN: Freq: Once | INTRAVENOUS | Status: AC

## 2019-09-19 MED ORDER — SODIUM CHLORIDE 0.9 % IV SOLN
10.0000 mg/kg | Freq: Once | INTRAVENOUS | Status: AC
  Administered 2019-09-19: 1240 mg via INTRAVENOUS
  Filled 2019-09-19: qty 20

## 2019-09-19 MED ORDER — HEPARIN SOD (PORK) LOCK FLUSH 100 UNIT/ML IV SOLN
500.0000 [IU] | Freq: Once | INTRAVENOUS | Status: AC | PRN
  Administered 2019-09-19: 500 [IU]

## 2019-09-19 MED ORDER — SODIUM CHLORIDE 0.9% FLUSH
10.0000 mL | INTRAVENOUS | Status: DC | PRN
  Administered 2019-09-19: 10 mL

## 2019-09-19 NOTE — Patient Instructions (Signed)
[facility] Discharge Instructions for Patients Receiving Chemotherapy  Today you received the following chemotherapy agents   To help prevent nausea and vomiting after your treatment, we encourage you to take your nausea medication   If you develop nausea and vomiting that is not controlled by your nausea medication, call the clinic.   BELOW ARE SYMPTOMS THAT SHOULD BE REPORTED IMMEDIATELY:  *FEVER GREATER THAN 100.5 F  *CHILLS WITH OR WITHOUT FEVER  NAUSEA AND VOMITING THAT IS NOT CONTROLLED WITH YOUR NAUSEA MEDICATION  *UNUSUAL SHORTNESS OF BREATH  *UNUSUAL BRUISING OR BLEEDING  TENDERNESS IN MOUTH AND THROAT WITH OR WITHOUT PRESENCE OF ULCERS  *URINARY PROBLEMS  *BOWEL PROBLEMS  UNUSUAL RASH Items with * indicate a potential emergency and should be followed up as soon as possible.  Feel free to call the clinic should you have any questions or concerns. The clinic phone number is [phone].  Please show the CHEMO ALERT CARD at check-in to the Emergency Department and triage nurse.   

## 2019-09-19 NOTE — Progress Notes (Signed)
Patient presents today for treatment. Labs pending. Vital signs stable. Patient has no complaints of any significant changes since her last visit. MAR reviewed.   Labs reviewed by [facility] NP. Proceed with treatment verbal order received.   Treatment given today per MD orders. Tolerated infusion without adverse affects. Vital signs stable. No complaints at this time. Discharged from clinic ambulatory. F/U with [facility] as scheduled.

## 2019-09-29 ENCOUNTER — Ambulatory Visit (HOSPITAL_COMMUNITY)
Admission: RE | Admit: 2019-09-29 | Discharge: 2019-09-29 | Disposition: A | Discharge status: Home / Self Care | Payer: Medicare Other | Source: Ambulatory Visit | Attending: Hematology | Admitting: Hematology

## 2019-09-29 ENCOUNTER — Other Ambulatory Visit: Payer: Self-pay

## 2019-09-29 DIAGNOSIS — C3492 Malignant neoplasm of unspecified part of left bronchus or lung: Secondary | ICD-10-CM | POA: Insufficient documentation

## 2019-09-29 DIAGNOSIS — I700 Atherosclerosis of aorta: Secondary | ICD-10-CM | POA: Diagnosis not present

## 2019-09-29 DIAGNOSIS — I313 Pericardial effusion (noninflammatory): Secondary | ICD-10-CM | POA: Diagnosis not present

## 2019-09-29 DIAGNOSIS — J984 Other disorders of lung: Secondary | ICD-10-CM | POA: Diagnosis not present

## 2019-09-29 DIAGNOSIS — I2510 Atherosclerotic heart disease of native coronary artery without angina pectoris: Secondary | ICD-10-CM | POA: Diagnosis not present

## 2019-09-29 IMAGING — CT CT CHEST W/ CM
2 of 3 series · 15 of 36 positions shown, 18 images · IV contrast (omnipaque)
Comparison: [DATE]

CLINICAL DATA: Follow-up pulmonary adenocarcinoma.

EXAM:
CT CHEST WITH CONTRAST
TECHNIQUE: Multidetector CT imaging of the chest was performed during
intravenous contrast administration.
CONTRAST:  75mL OMNIPAQUE IOHEXOL 300 MG/ML  SOLN

[Series 2: routine chest with · axial · 0.69mm/px · z∈[+1196,+1444]mm · 12 of 146 slices shown, 15 images]
[im 11/146  mediastinal]
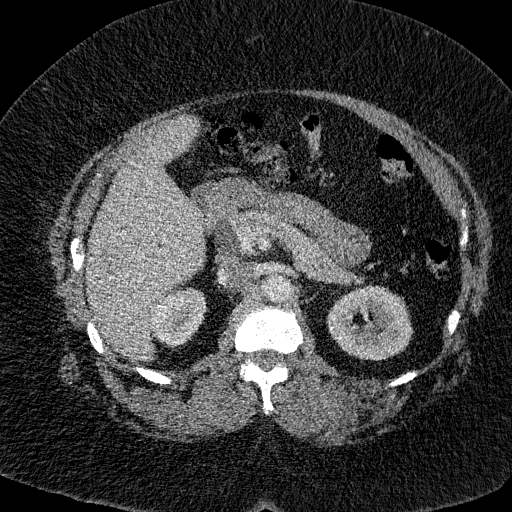
[im 11/146  lung]
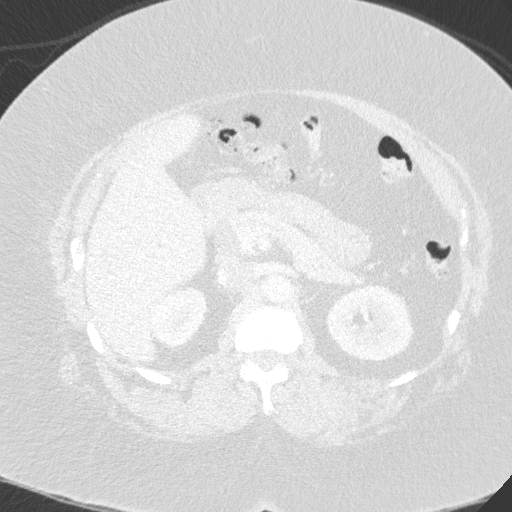
[im 22/146  lung]
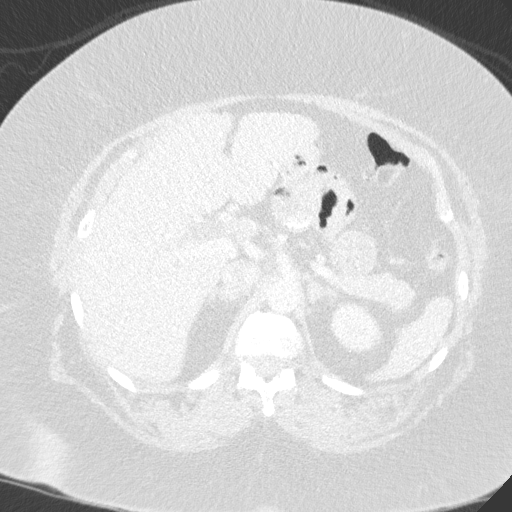
[im 33/146  lung]
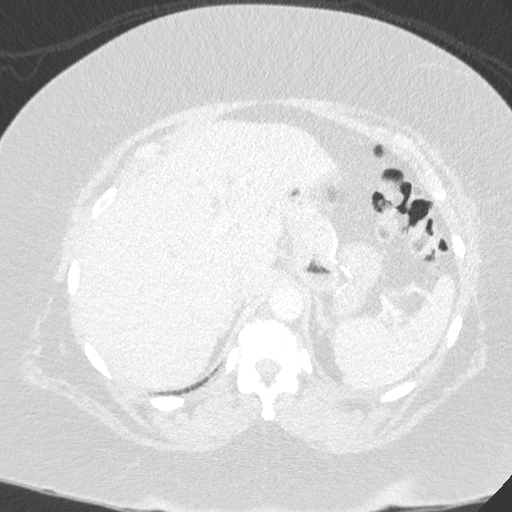
[im 43/146  lung]
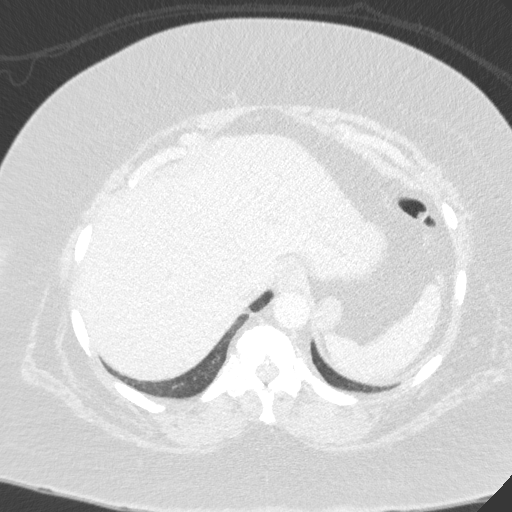
[im 54/146  mediastinal]
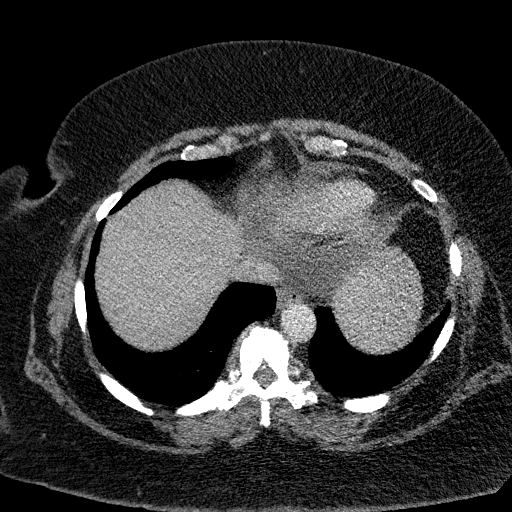
[im 54/146  lung]
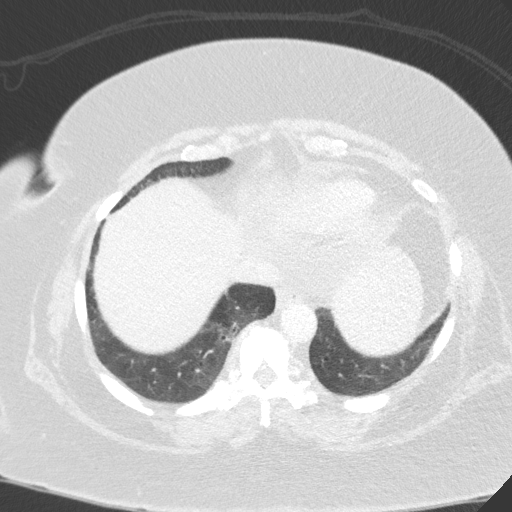
[im 65/146  lung]
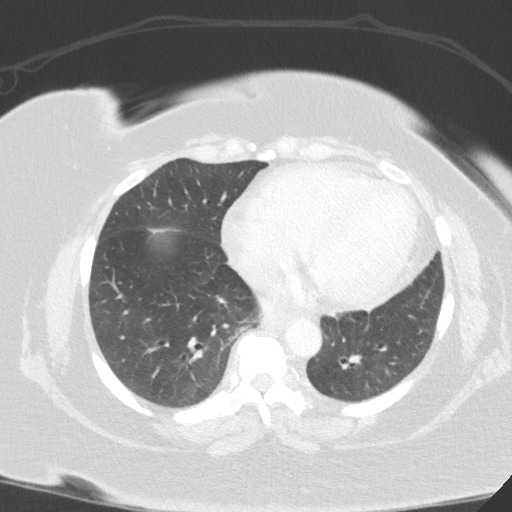
[im 81/146  lung]
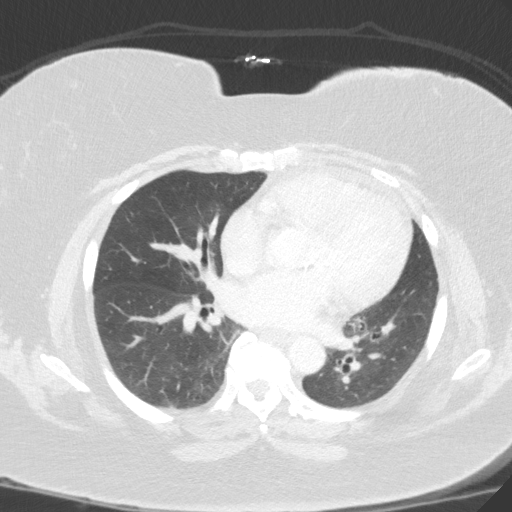
[im 92/146  lung]
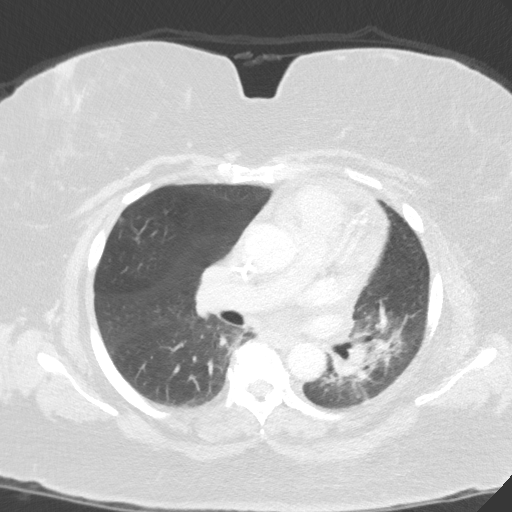
[im 103/146  mediastinal]
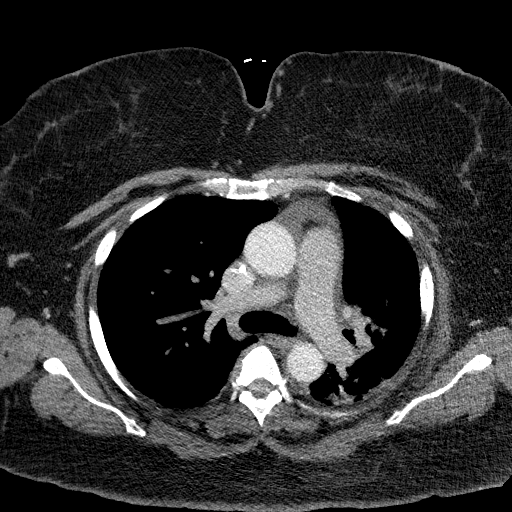
[im 103/146  lung]
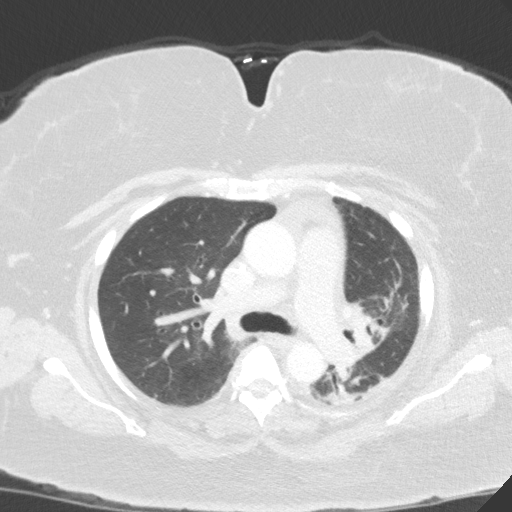
[im 113/146  lung]
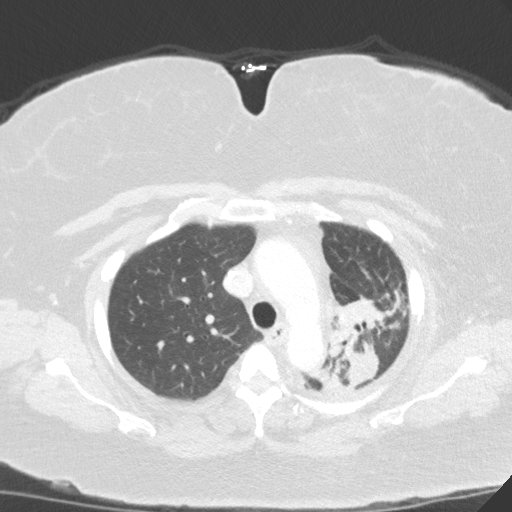
[im 124/146  lung]
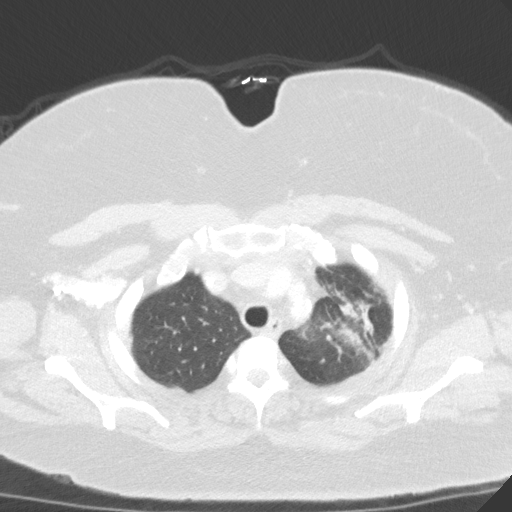
[im 135/146  lung]
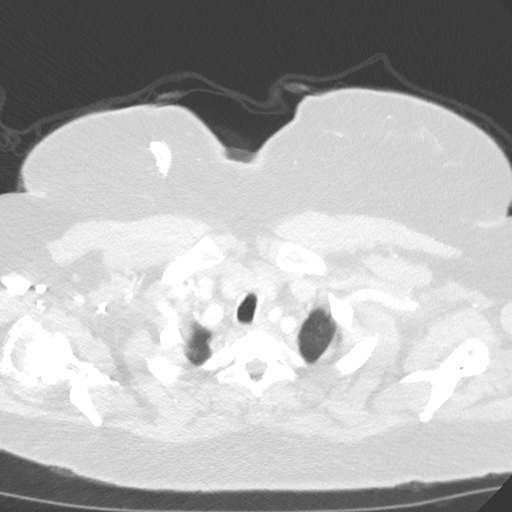

[Series 5: coronal · coronal · 0.65mm/px · 3 of 149 slices shown]
[im 30/149  lung]
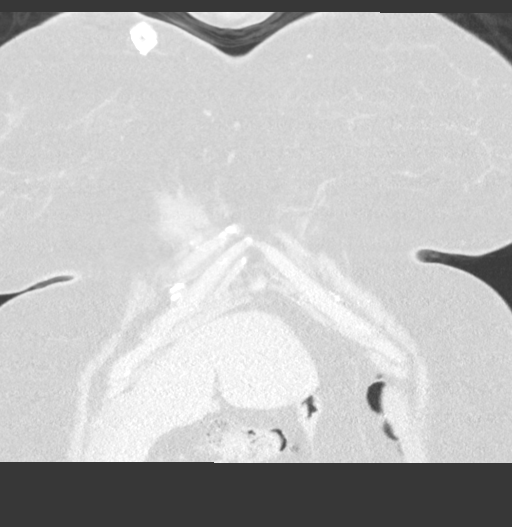
[im 60/149  lung]
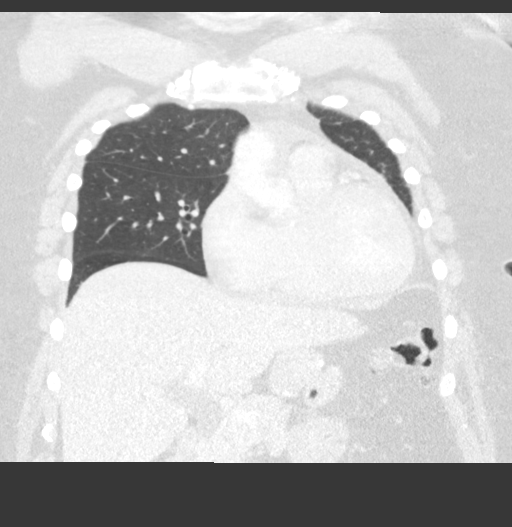
[im 89/149  lung]
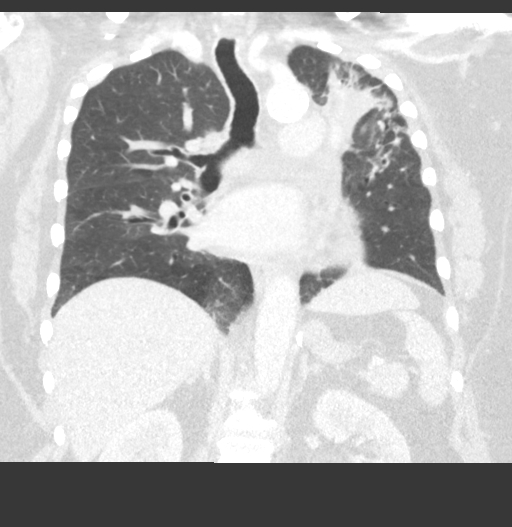

[15 of 36 positions shown; findings below may reference images not displayed]

FINDINGS: Cardiovascular: Mild cardiac enlargement. Pericardial effusion is
again noted and appears similar. Aortic atherosclerosis. Three
vessel coronary artery atherosclerotic calcifications.

Mediastinum/Nodes: Normal appearance of the thyroid gland. The
trachea appears patent and is midline. Normal appearance of the
esophagus. No axillary, supraclavicular, mediastinal, or hilar
adenopathy.

Lungs/Pleura: Post treatment changes within the left upper lobe
including fibrosis, masslike architectural distortion, and volume
loss. Treated lung lesion within the posterior left upper lobe
measures 2.7 x 2.1 cm, image 35/4. Unchanged when compared with the
previous exam. Anterior and medial to the treated lung lesion is a
new bandlike area of increased soft tissue measuring 3.2 x 1.4 x
cm, image 32/4 and image 137/6.

Upper Abdomen: Status post cholecystectomy with increase caliber of
the CBD. Previous gastric bypass. No acute findings within the
imaged portions of the upper abdomen. Unchanged cyst within left
hepatic lobe measuring 1.3 cm.

Musculoskeletal: Spondylosis identified within the thoracic spine.
No acute or aggressive bone lesions. Advanced degenerative changes
noted involving the right glenohumeral joint. Previous left shoulder
arthroplasty.
IMPRESSION: 1. Stable appearance of treated lung lesion within the posterior
left upper lobe.
2. Interval development of new bandlike area of increased soft
tissue density within the anterior to the treated lung lesion. This
is favored to represent progressive changes of external beam
radiation. Local tumor recurrence is less favored but cannot be
excluded based on today's exam. Consider short-term interval
follow-up versus PET-CT.
3. Stable pericardial effusion.
4. Three vessel coronary artery atherosclerotic calcifications.
5. Aortic atherosclerosis.

Aortic Atherosclerosis ([AB]-[AB]).

## 2019-09-29 MED ORDER — IOHEXOL 300 MG/ML  SOLN
75.0000 mL | Freq: Once | INTRAMUSCULAR | Status: AC | PRN
  Administered 2019-09-29: 75 mL via INTRAVENOUS

## 2019-10-02 ENCOUNTER — Inpatient Hospital Stay (HOSPITAL_BASED_OUTPATIENT_CLINIC_OR_DEPARTMENT_OTHER): Payer: Medicare Other | Admitting: Hematology

## 2019-10-02 ENCOUNTER — Inpatient Hospital Stay (HOSPITAL_COMMUNITY): Payer: Medicare Other

## 2019-10-02 ENCOUNTER — Ambulatory Visit (HOSPITAL_COMMUNITY)
Admission: RE | Admit: 2019-10-02 | Discharge: 2019-10-02 | Disposition: A | Discharge status: Home / Self Care | Payer: Medicare Other | Source: Ambulatory Visit | Attending: Hematology | Admitting: Hematology

## 2019-10-02 ENCOUNTER — Other Ambulatory Visit: Payer: Self-pay

## 2019-10-02 ENCOUNTER — Other Ambulatory Visit (HOSPITAL_COMMUNITY): Payer: Self-pay | Admitting: Hematology

## 2019-10-02 VITALS — BP 121/52 | HR 86 | Temp 97.3°F | Resp 18

## 2019-10-02 VITALS — BP 117/48 | HR 94 | Temp 97.2°F | Resp 20 | Wt 289.0 lb

## 2019-10-02 DIAGNOSIS — Z95828 Presence of other vascular implants and grafts: Secondary | ICD-10-CM

## 2019-10-02 DIAGNOSIS — M79661 Pain in right lower leg: Secondary | ICD-10-CM | POA: Diagnosis not present

## 2019-10-02 DIAGNOSIS — E119 Type 2 diabetes mellitus without complications: Secondary | ICD-10-CM | POA: Diagnosis not present

## 2019-10-02 DIAGNOSIS — C3492 Malignant neoplasm of unspecified part of left bronchus or lung: Secondary | ICD-10-CM

## 2019-10-02 DIAGNOSIS — I10 Essential (primary) hypertension: Secondary | ICD-10-CM | POA: Diagnosis not present

## 2019-10-02 DIAGNOSIS — C3412 Malignant neoplasm of upper lobe, left bronchus or lung: Secondary | ICD-10-CM | POA: Diagnosis not present

## 2019-10-02 DIAGNOSIS — Z5112 Encounter for antineoplastic immunotherapy: Secondary | ICD-10-CM | POA: Diagnosis not present

## 2019-10-02 DIAGNOSIS — M79604 Pain in right leg: Secondary | ICD-10-CM

## 2019-10-02 DIAGNOSIS — M7989 Other specified soft tissue disorders: Secondary | ICD-10-CM

## 2019-10-02 DIAGNOSIS — R609 Edema, unspecified: Secondary | ICD-10-CM | POA: Diagnosis not present

## 2019-10-02 DIAGNOSIS — Z79899 Other long term (current) drug therapy: Secondary | ICD-10-CM | POA: Diagnosis not present

## 2019-10-02 LAB — COMPREHENSIVE METABOLIC PANEL
ALT: 18 U/L (ref 0–44)
AST: 17 U/L (ref 15–41)
Albumin: 3.5 g/dL (ref 3.5–5.0)
Alkaline Phosphatase: 80 U/L (ref 38–126)
Anion gap: 8 (ref 5–15)
BUN: 12 mg/dL (ref 8–23)
CO2: 25 mmol/L (ref 22–32)
Calcium: 8.8 mg/dL — ABNORMAL LOW (ref 8.9–10.3)
Chloride: 108 mmol/L (ref 98–111)
Creatinine, Ser: 0.71 mg/dL (ref 0.44–1.00)
GFR calc Af Amer: >60 mL/min (ref >60)
GFR calc non Af Amer: >60 mL/min (ref >60)
Glucose, Bld: 119 mg/dL — ABNORMAL HIGH (ref 70–99)
Potassium: 4 mmol/L (ref 3.5–5.1)
Sodium: 141 mmol/L (ref 135–145)
Total Bilirubin: 0.4 mg/dL (ref 0.3–1.2)
Total Protein: 6.3 g/dL — ABNORMAL LOW (ref 6.5–8.1)

## 2019-10-02 LAB — CBC WITH DIFFERENTIAL/PLATELET
Abs Immature Granulocytes: 0.01 10*3/uL (ref 0.00–0.07)
Basophils Absolute: 0 10*3/uL (ref 0.0–0.1)
Basophils Relative: 1 %
Eosinophils Absolute: 0.1 10*3/uL (ref 0.0–0.5)
Eosinophils Relative: 3 %
HCT: 38.6 % (ref 36.0–46.0)
Hemoglobin: 11.8 g/dL — ABNORMAL LOW (ref 12.0–15.0)
Immature Granulocytes: 0 %
Lymphocytes Relative: 18 %
Lymphs Abs: 0.9 10*3/uL (ref 0.7–4.0)
MCH: 26.8 pg (ref 26.0–34.0)
MCHC: 30.6 g/dL (ref 30.0–36.0)
MCV: 87.7 fL (ref 80.0–100.0)
Monocytes Absolute: 0.4 10*3/uL (ref 0.1–1.0)
Monocytes Relative: 9 %
Neutro Abs: 3.5 10*3/uL (ref 1.7–7.7)
Neutrophils Relative %: 69 %
Platelets: 327 10*3/uL (ref 150–400)
RBC: 4.4 MIL/uL (ref 3.87–5.11)
RDW: 14.7 % (ref 11.5–15.5)
WBC: 5 10*3/uL (ref 4.0–10.5)
nRBC: 0 % (ref 0.0–0.2)

## 2019-10-02 LAB — TSH: TSH: 1.947 u[IU]/mL (ref 0.350–4.500)

## 2019-10-02 IMAGING — US US EXTREM LOW VENOUS*R*
1 series · 13 of 24 positions shown · non-contrast
Comparison: None.

CLINICAL DATA: Right lower extremity pain and edema. Former smoker.
History of lung cancer. Evaluate for DVT.



[Series 1: us venous img lower uni right (dvt) · portal-venous · 13 of 36 slices shown]
[im 1/36]
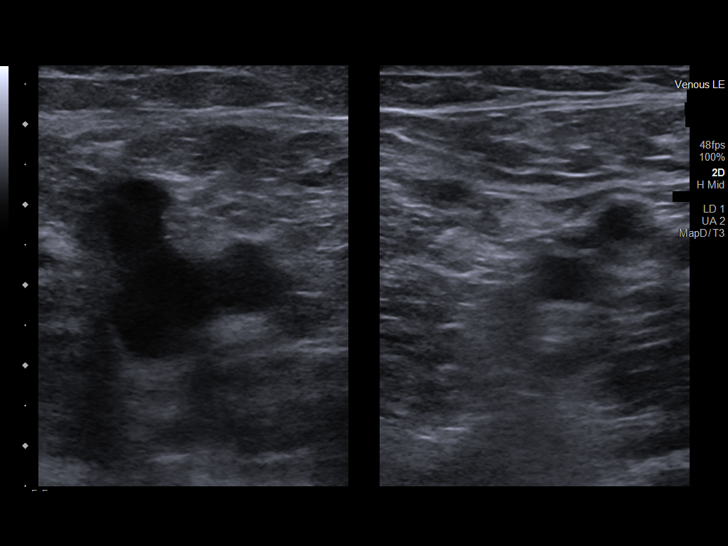
[im 4/36]
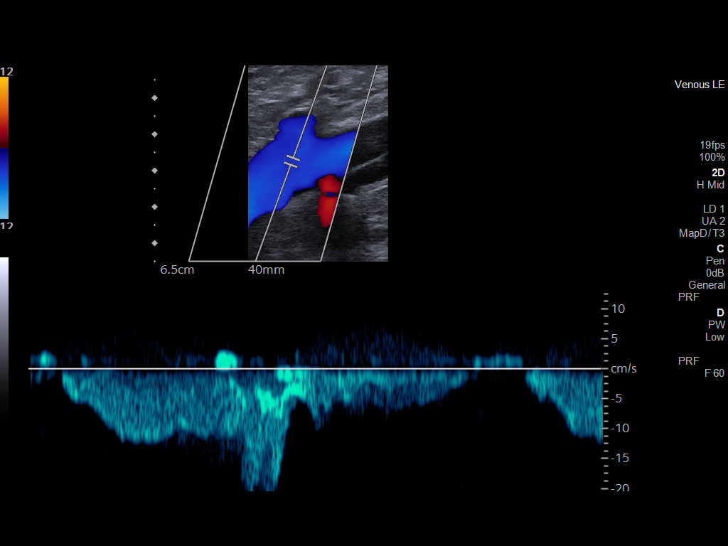
[im 7/36]
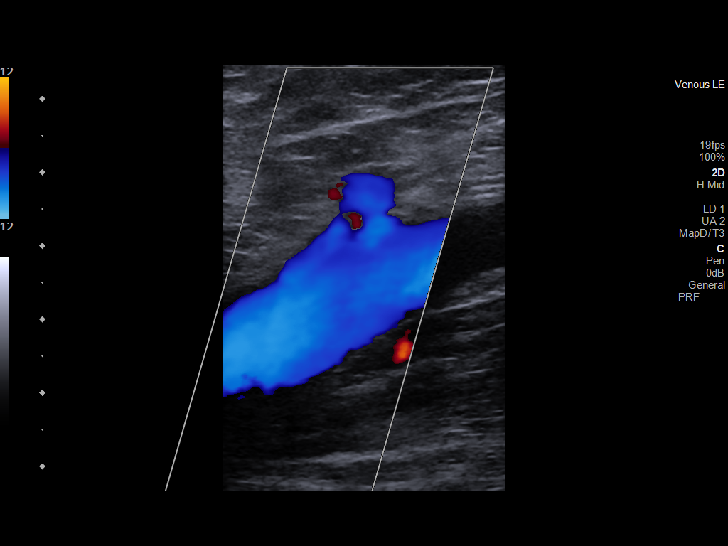
[im 10/36]
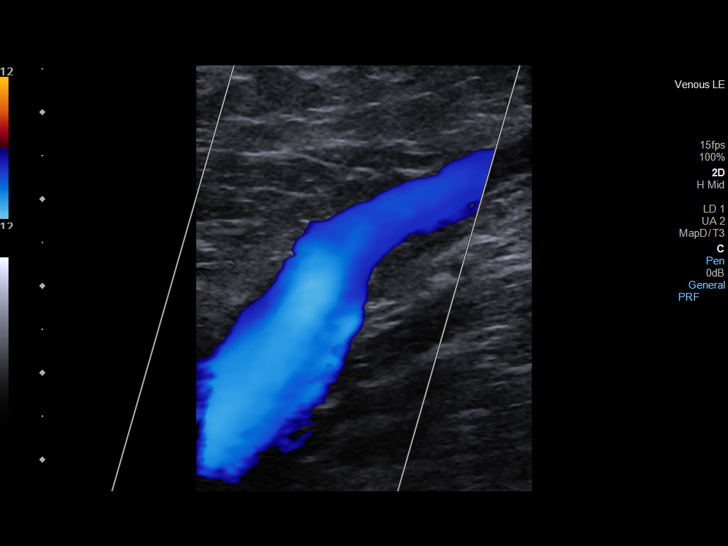
[im 13/36]
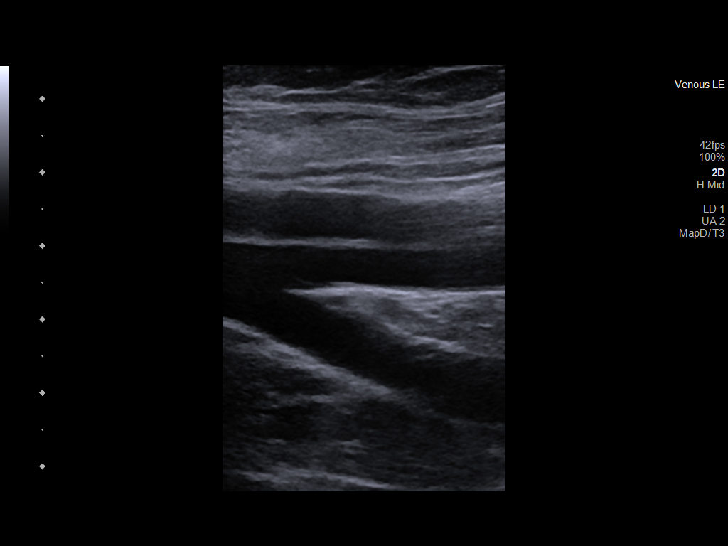
[im 16/36]
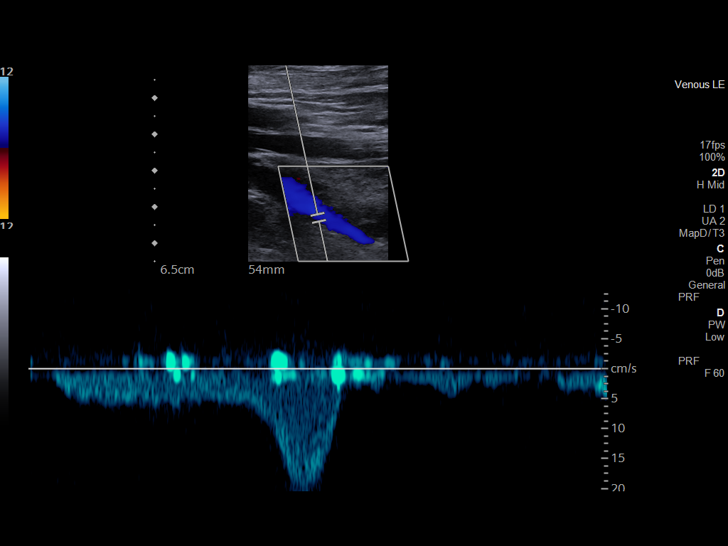
[im 19/36]
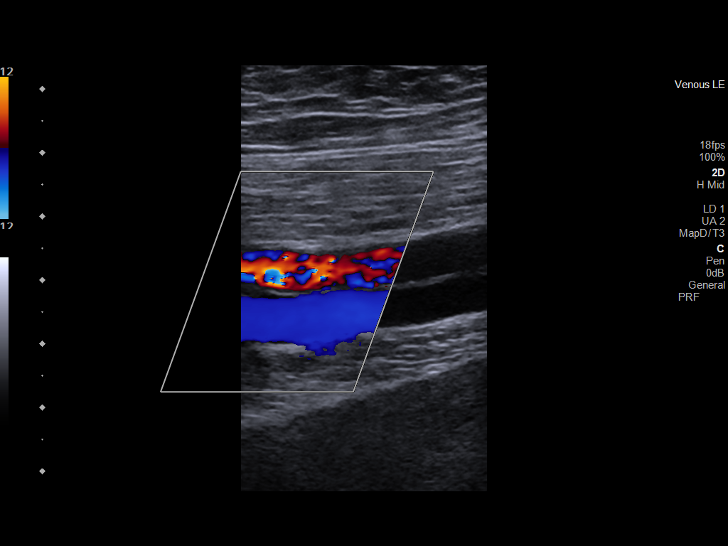
[im 20/36]
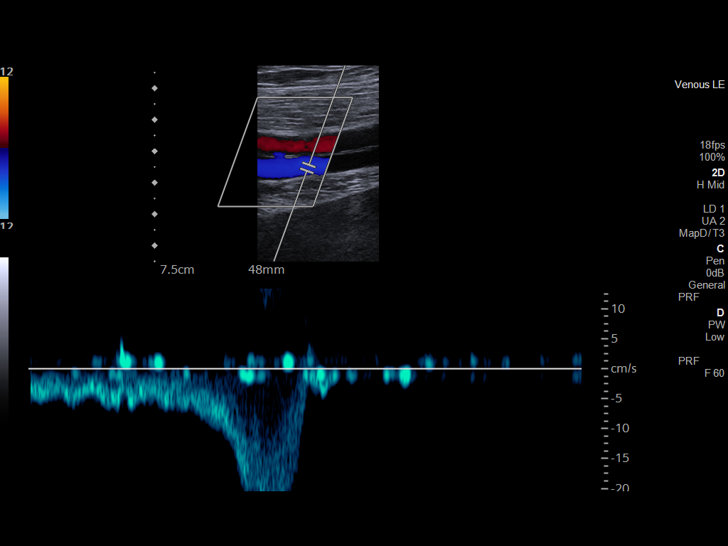
[im 23/36]
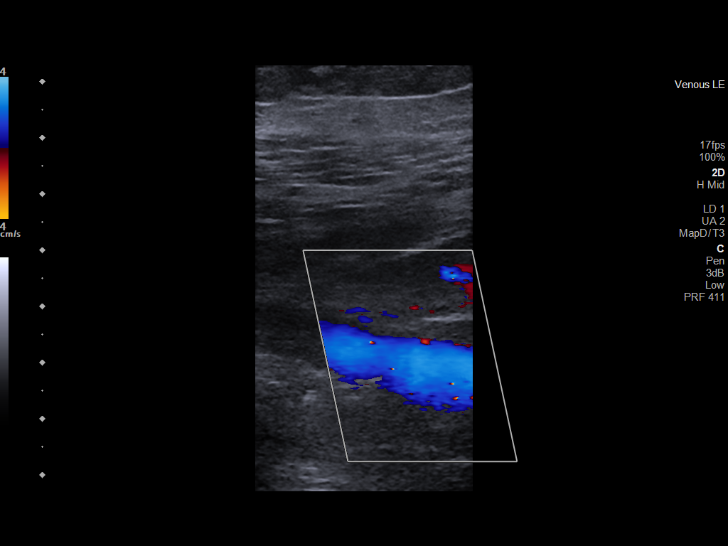
[im 26/36]
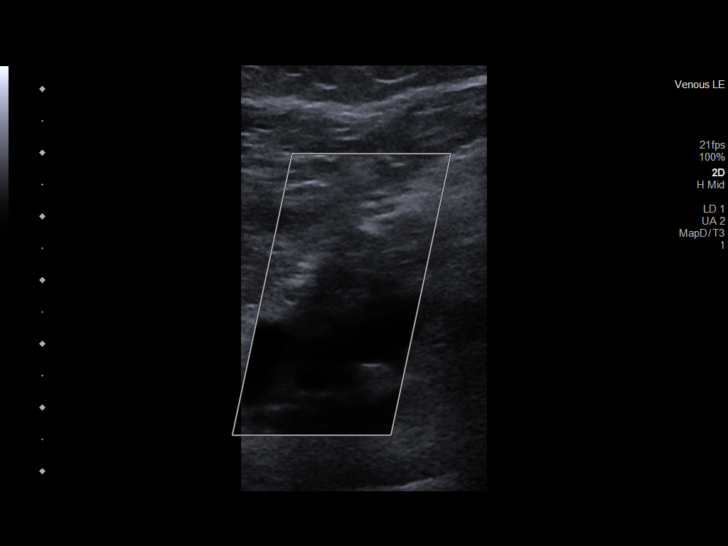
[im 29/36]
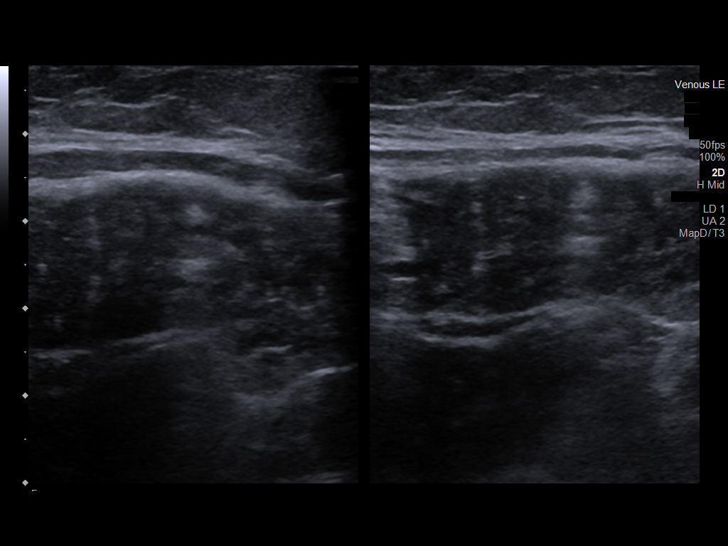
[im 32/36]
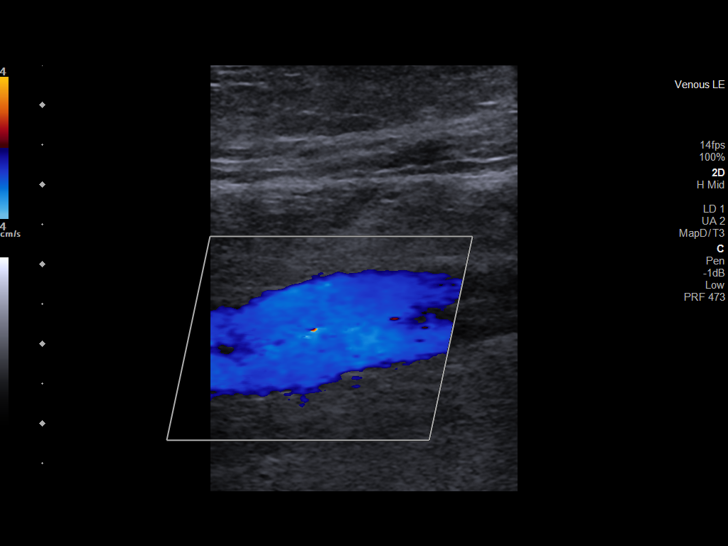
[im 36/36]
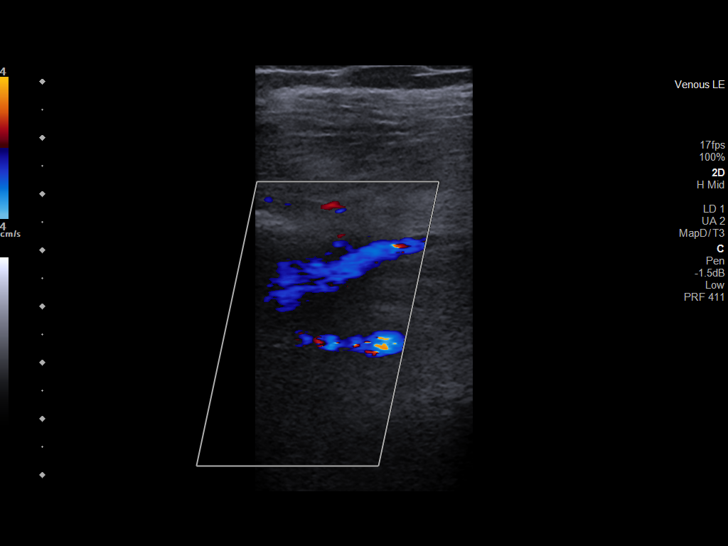

[13 of 24 positions shown; findings below may reference images not displayed]

FINDINGS: Contralateral Common Femoral Vein: Respiratory phasicity is normal
and symmetric with the symptomatic side. No evidence of thrombus.
Normal compressibility.

Common Femoral Vein: No evidence of thrombus. Normal
compressibility, respiratory phasicity and response to augmentation.

Saphenofemoral Junction: No evidence of thrombus. Normal
compressibility and flow on color Doppler imaging.

Profunda Femoral Vein: No evidence of thrombus. Normal
compressibility and flow on color Doppler imaging.

Femoral Vein: No evidence of thrombus. Normal compressibility,
respiratory phasicity and response to augmentation.

Popliteal Vein: No evidence of thrombus. Normal compressibility,
respiratory phasicity and response to augmentation.

Calf Veins: No evidence of thrombus. Normal compressibility and flow
on color Doppler imaging.

Superficial Great Saphenous Vein: No evidence of thrombus. Normal
compressibility.

Venous Reflux:  None.

Other Findings:  None.
IMPRESSION: No evidence of DVT within the right lower extremity.

## 2019-10-02 MED ORDER — HEPARIN SOD (PORK) LOCK FLUSH 100 UNIT/ML IV SOLN
500.0000 [IU] | Freq: Once | INTRAVENOUS | Status: AC | PRN
  Administered 2019-10-02: 500 [IU]

## 2019-10-02 MED ORDER — SODIUM CHLORIDE 0.9 % IV SOLN: Freq: Once | INTRAVENOUS | Status: AC

## 2019-10-02 MED ORDER — SODIUM CHLORIDE 0.9% FLUSH
10.0000 mL | INTRAVENOUS | Status: DC | PRN
  Administered 2019-10-02: 10 mL

## 2019-10-02 MED ORDER — SODIUM CHLORIDE 0.9 % IV SOLN
10.0000 mg/kg | Freq: Once | INTRAVENOUS | Status: AC
  Administered 2019-10-02: 1240 mg via INTRAVENOUS
  Filled 2019-10-02: qty 4.8

## 2019-10-02 NOTE — Progress Notes (Signed)
Denise [name], [place] [address]   CLINIC:  Medical Oncology/Hematology  PCP:  Denise [name], [address] / [place] [address] [phone]   REASON FOR VISIT:  Follow-up for stage III left lung adenocarcinoma  PRIOR THERAPY:  1. Chemoradiation with carboplatin and paclitaxel from 01/07/2019 to 02/11/2019 2. Consolidation with durvalumab from 03/19/2019 to 04/16/2019, held due to pneumonitis  CURRENT THERAPY: Consolidation with durvalumab  BRIEF ONCOLOGIC HISTORY:  Oncology History  Adenocarcinoma of left lung ([place])  12/09/2018 Initial Diagnosis   Adenocarcinoma of left lung ([place])   01/02/2019 Cancer Staging   Staging form: Lung, AJCC 8th Edition - Clinical stage from 01/02/2019: Stage IIIB (cT3, cN2, cM0) - Signed by Derek Jack, MD on 01/02/2019   01/07/2019 - 02/17/2019 Chemotherapy   The patient had palonosetron (ALOXI) injection 0.25 mg, 0.25 mg, Intravenous,  Once, 6 of 6 cycles Administration: 0.25 mg (01/07/2019), 0.25 mg (01/14/2019), 0.25 mg (01/21/2019), 0.25 mg (01/28/2019), 0.25 mg (02/04/2019), 0.25 mg (02/11/2019) CARBOplatin (PARAPLATIN) 270 mg in sodium chloride 0.9 % 250 mL chemo infusion, 270 mg (100 % of original dose 266.4 mg), Intravenous,  Once, 6 of 6 cycles Dose modification:   (original dose 266.4 mg, Cycle 1),   (original dose 266.4 mg, Cycle 2),   (original dose 266.4 mg, Cycle 3),   (original dose 266.4 mg, Cycle 4) Administration: 270 mg (01/07/2019), 270 mg (01/14/2019), 270 mg (01/21/2019), 270 mg (01/28/2019), 270 mg (02/04/2019), 270 mg (02/11/2019) PACLitaxel (TAXOL) 108 mg in sodium chloride 0.9 % 250 mL chemo infusion (</= 80mg /m2), 45 mg/m2 = 108 mg, Intravenous,  Once, 6 of 6 cycles Administration: 108 mg (01/07/2019), 108 mg (01/14/2019), 108 mg (01/21/2019), 108 mg (01/28/2019), 108 mg (02/04/2019), 108 mg (02/11/2019) fosaprepitant (EMEND) 150 mg, dexamethasone (DECADRON) 12 mg in  sodium chloride 0.9 % 145 mL IVPB, , Intravenous,  Once, 5 of 5 cycles Administration:  (01/14/2019),  (01/21/2019),  (01/28/2019),  (02/04/2019),  (02/11/2019)  for chemotherapy treatment.    03/19/2019 -  Chemotherapy   The patient had durvalumab (IMFINZI) 1,240 mg in sodium chloride 0.9 % 100 mL chemo infusion, 9.9 mg/kg = 1,260 mg, Intravenous,  Once, 8 of 9 cycles Administration: 1,240 mg (03/19/2019), 1,240 mg (04/02/2019), 1,240 mg (04/16/2019), 1,240 mg (07/24/2019), 1,240 mg (08/07/2019), 1,240 mg (08/21/2019), 1,240 mg (09/04/2019), 1,240 mg (09/19/2019)  for chemotherapy treatment.      CANCER STAGING: Cancer Staging Adenocarcinoma of left lung [facility]) Staging form: Lung, AJCC 8th Edition - Clinical stage from 01/02/2019: Stage IIIB (cT3, cN2, cM0) - Signed by Derek Jack, MD on 01/02/2019   INTERVAL HISTORY:  Denise [name], a 69 y.o. female, returns for routine follow-up and consideration for next cycle of chemotherapy. Denise [name] was last seen on 09/04/2019.  Due for cycle #9 of durvalumab today.   Today she reports having pain in her right calf when she stands, especially over the inner side of the calf near the knee. She denies having DVT in the past and still has SOB when walking, which is stable.   REVIEW OF SYSTEMS:  Review of Systems  Constitutional: Positive for fatigue. Negative for appetite change.  Respiratory: Positive for shortness of breath (w/ exertion).   Cardiovascular: Positive for leg swelling (R calf swollen).  Gastrointestinal: Negative for diarrhea.  Musculoskeletal: Positive for arthralgias (8/10 R calf pain when walking).  Skin: Negative for rash.  All other systems reviewed and are negative.   PAST MEDICAL/SURGICAL HISTORY:  Past Medical History:  Diagnosis Date   Anemia    Arthritis    Diabetes mellitus    diet controlled   GERD (gastroesophageal reflux disease)    HTN (hypertension)    HTN (hypertension)    Port-A-Cath in place  01/06/2019   Shortness of breath dyspnea    with exertion   Past Surgical History:  Procedure Laterality Date   CHOLECYSTECTOMY  1997   COLONOSCOPY     GASTRIC BYPASS     INCISIONAL HERNIA REPAIR  04/11/11   IR IMAGING GUIDED PORT INSERTION  12/27/2018   LAPAROSCOPIC SALPINGOOPHERECTOMY     LAPAROTOMY  04/11/2011   Procedure: EXPLORATORY LAPAROTOMY;  Surgeon: Joyice Faster. Cornett, MD;  Location: [facility];  Service: General;  Laterality: N/A;  closure port hole   TOTAL HIP ARTHROPLASTY  03/07/2012   Procedure: TOTAL HIP ARTHROPLASTY ANTERIOR APPROACH;  Surgeon: Mauri Pole, MD;  Location: [facility];  Service: Orthopedics;  Laterality: Right;   TOTAL SHOULDER ARTHROPLASTY Left 01/21/2015   TOTAL SHOULDER ARTHROPLASTY Left 01/21/2015   Procedure: LEFT TOTAL SHOULDER ARTHROPLASTY;  Surgeon: Justice Britain, MD;  Location: [facility];  Service: Orthopedics;  Laterality: Left;   VAGINAL HYSTERECTOMY      SOCIAL HISTORY:  Social History   Socioeconomic History   Marital status: Single    Spouse name: Not on file   Number of children: Not on file   Years of education: 12th grade   Highest education level: Not on file  Occupational History   Occupation: Employed    Employer: [employer]  Tobacco Use   Smoking status: Former Smoker    Packs/day: 1.00    Years: 12.00    Pack years: 12.00    Types: Cigarettes    Quit date: 03/20/1976    Years since quitting: 43.5   Smokeless tobacco: Never Used  Vaping Use   Vaping Use: Never used  Substance and Sexual Activity   Alcohol use: No   Drug use: No   Sexual activity: Never  Other Topics Concern   Not on file  Social History Narrative   Not on file   Social Determinants of Health   Financial Resource Strain: Low Risk    Difficulty of Paying Living Expenses: Not very hard  Food Insecurity: No Food Insecurity   Worried About Charity fundraiser in the Last Year: Never true   [place] in the Last  Year: Never true  Transportation Needs: No Transportation Needs   Lack of Transportation (Medical): No   Lack of Transportation (Non-Medical): No  Physical Activity: Inactive   Days of Exercise per Week: 0 days   Minutes of Exercise per Session: 0 min  Stress: No Stress Concern Present   Feeling of Stress : Only a little  Social Connections: Moderately Isolated   Frequency of Communication with Friends and Family: More than three times a week   Frequency of Social Gatherings with Friends and Family: Once a week   Attends Religious Services: More than 4 times per year   Active Member of [employer] or Organizations: No   Attends Archivist Meetings: Never   Marital Status: Never married  Human resources officer Violence: Not At Risk   Fear of Current or Ex-Partner: No   Emotionally Abused: No   Physically Abused: No   Sexually Abused: No    FAMILY HISTORY:  Family History  Problem Relation Age of Onset   Breast cancer Mother    COPD  Mother    Arthritis Mother    Diabetes Mother    Hypertension Mother    Hypertension Father    Diabetes Father    Breast cancer Sister    Thyroid cancer Brother    Huntington's disease Maternal Grandmother    Heart attack Brother     CURRENT MEDICATIONS:  Current Outpatient Medications  Medication Sig Dispense Refill   amLODipine (NORVASC) 5 MG tablet Take 5 mg by mouth every morning.      Calcium Carbonate Antacid (TUMS PO) Take 4 tablets by mouth daily.      Cyanocobalamin (VITAMIN B-12) 2500 MCG SUBL Place 2,500 mcg under the tongue every morning.      DURVALUMAB IV Inject into the vein every 14 (fourteen) days.     ferrous sulfate 325 (65 FE) MG tablet Take 1 tablet (325 mg total) by mouth 3 (three) times daily after meals. (Patient taking differently: Take 325 mg by mouth daily. )     Multiple Vitamin (MULITIVITAMIN WITH MINERALS) TABS Take 1 tablet by mouth every morning.      omeprazole (PRILOSEC) 20 MG  capsule Take 20 mg by mouth daily.     albuterol (VENTOLIN HFA) 108 (90 Base) MCG/ACT inhaler TAKE 2 PUFFS BY MOUTH EVERY 6 HOURS AS NEEDED FOR WHEEZE OR SHORTNESS OF BREATH (Patient not taking: Reported on 10/02/2019) 8 g 6   ALPRAZolam (XANAX) 0.25 MG tablet Take 0.25 mg by mouth as needed.  (Patient not taking: Reported on 10/02/2019)     diphenhydrAMINE (BENADRYL) 25 MG tablet Take 25 mg by mouth every 6 (six) hours as needed.  (Patient not taking: Reported on 10/02/2019)     gabapentin (NEURONTIN) 300 MG capsule Take 300 mg by mouth at bedtime as needed for pain.  (Patient not taking: Reported on 10/02/2019)     ibuprofen (ADVIL,MOTRIN) 800 MG tablet Take 1 tablet (800 mg total) by mouth 3 (three) times daily. (Patient not taking: Reported on 10/02/2019) 90 tablet 2   naproxen (NAPROSYN) 500 MG tablet Take 500 mg by mouth as needed.  (Patient not taking: Reported on 10/02/2019)     No current facility-administered medications for this visit.    ALLERGIES:  No Known Allergies  PHYSICAL EXAM:  Performance status (ECOG): 1 - Symptomatic but completely ambulatory  Vitals:   10/02/19 0943  BP: (!) 117/48  Pulse: 94  Resp: 20  Temp: (!) 97.2 F (36.2 C)  SpO2: 100%   Wt Readings from Last 3 Encounters:  10/02/19 289 lb 0.4 oz (131.1 kg)  09/04/19 292 lb 3.2 oz (132.5 kg)  08/21/19 290 lb 3.2 oz (131.6 kg)   Physical Exam Vitals reviewed.  Constitutional:      Appearance: Normal appearance. She is obese.  Cardiovascular:     Rate and Rhythm: Normal rate and regular rhythm.     Pulses: Normal pulses.     Heart sounds: Normal heart sounds.  Pulmonary:     Effort: Pulmonary effort is normal.     Breath sounds: Normal breath sounds.  Chest:     Comments: Port on R chest Musculoskeletal:     Right lower leg: Swelling (R calf swollen > L calf) present. Edema present.  Neurological:     General: No focal deficit present.     Mental Status: She is alert and oriented to person,  place, and time.     LABORATORY DATA:  I have reviewed the labs as listed.  CBC Latest Ref Rng & Units 10/02/2019  09/19/2019 09/04/2019  WBC 4.0 - 10.5 K/uL 5.0 4.1 4.3  Hemoglobin 12.0 - 15.0 g/dL 11.8(L) 11.4(L) 11.3(L)  Hematocrit 36 - 46 % 38.6 36.9 36.7  Platelets 150 - 400 K/uL 327 314 321   CMP Latest Ref Rng & Units 10/02/2019 09/19/2019 09/04/2019  Glucose 70 - 99 mg/dL 119(H) 113(H) 114(H)  BUN 8 - 23 mg/dL 12 16 14   Creatinine 0.44 - 1.00 mg/dL 0.71 0.70 0.68  Sodium 135 - 145 mmol/L 141 141 141  Potassium 3.5 - 5.1 mmol/L 4.0 4.0 3.7  Chloride 98 - 111 mmol/L 108 106 106  CO2 22 - 32 mmol/L 25 26 27   Calcium 8.9 - 10.3 mg/dL 8.8(L) 8.9 8.7(L)  Total Protein 6.5 - 8.1 g/dL 6.3(L) 6.3(L) 6.1(L)  Total Bilirubin 0.3 - 1.2 mg/dL 0.4 0.4 0.4  Alkaline Phos 38 - 126 U/L 80 79 77  AST 15 - 41 U/L 17 17 17   ALT 0 - 44 U/L 18 18 18     DIAGNOSTIC IMAGING:  I have independently reviewed the scans and discussed with the patient. CT Chest W Contrast  Result Date: 09/29/2019 CLINICAL DATA:  Follow-up pulmonary adenocarcinoma. EXAM: CT CHEST WITH CONTRAST TECHNIQUE: Multidetector CT imaging of the chest was performed during intravenous contrast administration. CONTRAST:  51mL OMNIPAQUE IOHEXOL 300 MG/ML  SOLN COMPARISON:  07/02/2019 FINDINGS: Cardiovascular: Mild cardiac enlargement. Pericardial effusion is again noted and appears similar. Aortic atherosclerosis. Three vessel coronary artery atherosclerotic calcifications. Mediastinum/Nodes: Normal appearance of the thyroid gland. The trachea appears patent and is midline. Normal appearance of the esophagus. No axillary, supraclavicular, mediastinal, or hilar adenopathy. Lungs/Pleura: Post treatment changes within the left upper lobe including fibrosis, masslike architectural distortion, and volume loss. Treated lung lesion within the posterior left upper lobe measures 2.7 x 2.1 cm, image 35/4. Unchanged when compared with the previous exam.  Anterior and medial to the treated lung lesion is a new bandlike area of increased soft tissue measuring 3.2 x 1.4 x 3.6 cm, image 32/4 and image 137/6. Upper Abdomen: Status post cholecystectomy with increase caliber of the CBD. Previous gastric bypass. No acute findings within the imaged portions of the upper abdomen. Unchanged cyst within left hepatic lobe measuring 1.3 cm. Musculoskeletal: Spondylosis identified within the thoracic spine. No acute or aggressive bone lesions. Advanced degenerative changes noted involving the right glenohumeral joint. Previous left shoulder arthroplasty. IMPRESSION: 1. Stable appearance of treated lung lesion within the posterior left upper lobe. 2. Interval development of new bandlike area of increased soft tissue density within the anterior to the treated lung lesion. This is favored to represent progressive changes of external beam radiation. Local tumor recurrence is less favored but cannot be excluded based on today's exam. Consider short-term interval follow-up versus PET-CT. 3. Stable pericardial effusion. 4. Three vessel coronary artery atherosclerotic calcifications. 5. Aortic atherosclerosis. Aortic Atherosclerosis (ICD10-I70.0). Electronically Signed   By: Kerby Moors M.D.   On: 09/29/2019 16:51     ASSESSMENT:  1.  Adenocarcinoma of left lung ([facility]) -Chemoradiation therapy with carboplatin and paclitaxel from 01/07/2019 through 02/11/2019. -Consolidation immunotherapy with durvalumab from 03/19/2019 through 04/15/2018, held due to pneumonitis. -CT chest on 07/02/2019 shows left upper lobe lung mass measuring 2.6 x 2.0 cm. It shows improvement in size. -MRI of the brain on 07/18/2019 showed left sphenoid wing meningioma measuring 2.6 x 2.0 x 2.6 cm unchanged. No new enhancing intracranial lesion. Increased left temporal white matter edema. -Durvalumab restarted on 07/24/2019. -We reviewed CT chest with contrast from 09/29/2019  which showed stable appearance of  treated lung lesion within the posterior left upper lobe.  New bandlike area of increased soft tissue density within the anterior to the treated lung lesion favored to be progressive changes from radiation.  Local tumor recurrence is less favored.  Stable pericardial effusion.   PLAN:  1. Clinical stage IIIb (T3N2) adenocarcinoma of the lung: -I have reviewed CT scan results with the patient. -I have also reviewed labs which show normal CBC and LFTs.  Creatinine is normal. -We will proceed with her treatment today.  RTC 2 weeks with labs and treatment.   2. Shortness of breath on exertion: -She has an appointment with pulmonary soon.  3. Radiation pneumonitis: -Prednisone was tapered off on 07/21/2019.  4. Meningioma: -Does not report any left hemifacial pain.  Recommended watchful waiting.  5.  Hypertension: -Continue Norvasc.  6.  Right leg pain and swelling: -I have done Doppler in our office today.  It did not show any evidence of DVT.   Orders placed this encounter:  No orders of the defined types were placed in this encounter.    Derek Jack, MD [place] [phone]   I, Milinda Antis, am acting as a scribe for Dr. Sanda Linger.  I, Derek Jack MD, have reviewed the above documentation for accuracy and completeness, and I agree with the above.

## 2019-10-02 NOTE — Patient Instructions (Addendum)
[place] at [facility] Discharge Instructions  You were seen today by Dr. Delton Coombes. He went over your recent results and scans. You received your treatment today. You will be scheduled for an ultrasound study of your right calf to make sure you do not have a blood clot. Take an ibuprofen tonight for the calf pain if it does not resolve. Dr. Delton Coombes will see you back in 2 weeks for labs and follow up.   Thank you for choosing [place] at [facility] to provide your oncology and hematology care.  To afford each patient quality time with our provider, please arrive at least 15 minutes before your scheduled appointment time.   If you have a lab appointment with the [place] please come in thru the Main Entrance and check in at the main information desk  You need to re-schedule your appointment should you arrive 10 or more minutes late.  We strive to give you quality time with our providers, and arriving late affects you and other patients whose appointments are after yours.  Also, if you no show three or more times for appointments you may be dismissed from the clinic at the providers discretion.     Again, thank you for choosing [facility].  Our hope is that these requests will decrease the amount of time that you wait before being seen by our physicians.       _____________________________________________________________  Should you have questions after your visit to [facility], please contact our office at [phone] between the hours of 8:00 a.m. and 4:30 p.m.  Voicemails left after 4:00 p.m. will not be returned until the following business day.  For prescription refill requests, have your pharmacy contact our office and allow 72 hours.    Cancer Center Support Programs:   > Cancer Support Group  2nd Tuesday of the month 1pm-2pm, Journey Room

## 2019-10-02 NOTE — Patient Instructions (Signed)
[facility] Discharge Instructions for Patients Receiving Chemotherapy   Beginning January 23rd 2017 lab work for the Cancer Center will be done in the  Main lab at [facility] on 1st floor. If you have a lab appointment with the Cancer Center please come in thru the  Main Entrance and check in at the main information desk   Today you received the following chemotherapy agents Imfinzi  To help prevent nausea and vomiting after your treatment, we encourage you to take your nausea medication   If you develop nausea and vomiting, or diarrhea that is not controlled by your medication, call the clinic.  The clinic phone number is [phone]. Office hours are Monday-Friday 8:30am-5:00pm.  BELOW ARE SYMPTOMS THAT SHOULD BE REPORTED IMMEDIATELY:  *FEVER GREATER THAN 101.0 F  *CHILLS WITH OR WITHOUT FEVER  NAUSEA AND VOMITING THAT IS NOT CONTROLLED WITH YOUR NAUSEA MEDICATION  *UNUSUAL SHORTNESS OF BREATH  *UNUSUAL BRUISING OR BLEEDING  TENDERNESS IN MOUTH AND THROAT WITH OR WITHOUT PRESENCE OF ULCERS  *URINARY PROBLEMS  *BOWEL PROBLEMS  UNUSUAL RASH Items with * indicate a potential emergency and should be followed up as soon as possible. If you have an emergency after office hours please contact your primary care physician or go to the nearest emergency department.  Please call the clinic during office hours if you have any questions or concerns.   You may also contact the Patient Navigator at [phone] should you have any questions or need assistance in obtaining follow up care.      Resources For Cancer Patients and their Caregivers ? American Cancer Society: Can assist with transportation, wigs, general needs, runs Look Good Feel Better.        1-888-227-6333 ? Cancer Care: Provides financial assistance, online support groups, medication/co-pay assistance.  1-800-813-HOPE (4673) ? [facility] Assists [place] cancer  patients and their families through emotional , educational and financial support.  336-427-4357 ? [place] DSS Where to apply for food stamps, Medicaid and utility assistance. 336-342-1394 ? [facility]: Transportation to medical appointments. 336-347-2287 ? Social Security Administration: May apply for disability if have a Stage IV cancer. 336-342-7796 1-800-772-1213 ? [place] Aging, Disability and Transit Services: Assists with nutrition, care and transit needs. 336-349-2343          

## 2019-10-02 NOTE — Progress Notes (Signed)
Denise [name] presents today for D1C9 Imfinzi. Pt denies any new changes or symptoms since last treatment. Lab results and vitals have been reviewed and are stable and within parameters for treatment. Patient has been assessed by Dr. Delton Coombes who has approved proceeding with treatment today as planned.  Infusions tolerated without incident or complaint. VSS upon completion of treatment. Port flushed and deaccessed per protocol, see MAR and IV flowsheet for details. Discharged in satisfactory condition with follow up instructions.

## 2019-10-02 NOTE — Progress Notes (Signed)
Patient has been assessed, vital signs and labs have been reviewed by Dr. Katragadda. ANC, Creatinine, LFTs, and Platelets are within treatment parameters per Dr. Katragadda. The patient is good to proceed with treatment at this time.  

## 2019-10-07 DIAGNOSIS — Z6841 Body Mass Index (BMI) 40.0 and over, adult: Secondary | ICD-10-CM | POA: Diagnosis not present

## 2019-10-07 DIAGNOSIS — M170 Bilateral primary osteoarthritis of knee: Secondary | ICD-10-CM | POA: Diagnosis not present

## 2019-10-09 ENCOUNTER — Other Ambulatory Visit: Payer: Self-pay

## 2019-10-09 ENCOUNTER — Encounter: Payer: Self-pay | Admitting: Internal Medicine

## 2019-10-09 ENCOUNTER — Ambulatory Visit (INDEPENDENT_AMBULATORY_CARE_PROVIDER_SITE_OTHER): Payer: Medicare Other | Admitting: Internal Medicine

## 2019-10-09 DIAGNOSIS — Z6841 Body Mass Index (BMI) 40.0 and over, adult: Secondary | ICD-10-CM | POA: Diagnosis not present

## 2019-10-09 DIAGNOSIS — R0600 Dyspnea, unspecified: Secondary | ICD-10-CM | POA: Diagnosis not present

## 2019-10-09 DIAGNOSIS — R0609 Other forms of dyspnea: Secondary | ICD-10-CM | POA: Insufficient documentation

## 2019-10-09 NOTE — Assessment & Plan Note (Addendum)
Body mass index is 52.86 kg/m.   Lab Results  Component Value Date   TSH 1.947 10/02/2019     Contributing to gerd risk/ doe/reviewed the need and the process to achieve and maintain neg calorie balance > defer f/u primary care including intermittently monitoring thyroid status            Each maintenance medication was reviewed in detail including emphasizing most importantly the difference between maintenance and prns and under what circumstances the prns are to be triggered using an action plan format where appropriate.  Total time for H and P, chart review, counseling  directly observing portions of ambulatory 02 saturation study/  and generating customized AVS unique to this office visit / charting = 60 min

## 2019-10-09 NOTE — Patient Instructions (Addendum)
We will be referring you to the cardiology office in [place]   No other options for now   Make sure you check your oxygen saturations at highest level of activity to be sure it stays over 90% and keep track of it at least once a week, more often if breathing getting worse, and let me know if losing ground.    Pulmonary follow up is as needed

## 2019-10-09 NOTE — Assessment & Plan Note (Signed)
Onset around Jan 1st 2021 / slowly progressive Echo 05/15/19 Left ventricular ejection fraction, by estimation, is 60 to 65%. The  left ventricle has normal function. The left ventricle has no regional  wall motion abnormalities. There is moderate left ventricular hypertrophy.  Left ventricular diastolic  parameters are consistent with Grade I diastolic dysfunction (impaired  relaxation). Elevated left atrial pressure.  2. Right ventricular systolic function is normal. The right ventricular  size is normal. There is normal pulmonary artery systolic pressure.  3. Left atrial size was moderately dilated.  4. The mitral valve is normal in structure and function. No evidence of  mitral valve regurgitation. No evidence of mitral stenosis.  5. The aortic valve is tricuspid. Aortic valve regurgitation is mild.  Moderate aortic valve stenosis.Aortic valve mean gradient measures 21.0  mmHg. Aortic valve peak gradient measures 36.2 mmHg. Aortic valve area, by  VTI measures 1.27 cm  6. The inferior vena cava is normal in size with greater than 50%  respiratory variability, suggesting right atrial pressure of 3 mmHg.  7. Cannot exclude small PFO with left to right shunt.  -  10/09/2019   Walked RA  approx   200 ft  @ moderate pace  stopped due to  Sob with sats 92%   Clearly multifactorial:  diastolic dysfunction with AS/ Mod LAE since at least 04/2019 combined with obesity/ conditioning and RT lung injury (mostly to upper zone on L which is the smallest and least important lobe in the lung ) in that order should be considered here and given the echo findings I would like her to see cards before additonal pulmonary eval which should include PFT's to be complete.  However: NB When respiratory symptoms begin or become refractory well after a patient reports complete smoking cessation,  Especially when this wasn't the case while they were smoking, a red flag is raised based on the work of Dr Kris Mouton  which states:  if you quit smoking when your best day FEV1 is still well preserved it is highly unlikely you will progress to severe disease. That is to say, once the smoking stops,  the symptoms should not suddenly erupt or markedly worsen.  If so, the differential diagnosis should include  obesity/deconditioning,  LPR/Reflux/Aspiration syndromes,  occult CHF, or  especially side effect of medications commonly used in this population (radiation chemo)

## 2019-10-09 NOTE — Progress Notes (Signed)
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 Consult    Patient has shortness of breath with exertion and only saw Dr. Luan Pulling 1 time. She had a tumor on her lung and was coughing up blood and had a needle biopsy done and it was cancer and is currently doing treatment and did raditaion and chemo that ended in December.   Dyspnea:  Very slow pace x couple aisles then stop at [employer]lion Cough: none  Sleep: sleep in recliner 30 -45 degrees prior to treatment due to back problems /  not resp ccs SABA use: not helping   No obvious day to day or daytime variability or assoc excess/ purulent sputum or mucus plugs or hemoptysis or cp or chest tightness, subjective wheeze or overt sinus or hb symptoms.   Sleeping as above  without nocturnal  or early am exacerbation  of respiratory  c/o's or need for noct saba. Also denies any obvious fluctuation of symptoms with weather or environmental changes or other aggravating or alleviating factors except as outlined above   No unusual exposure hx or h/o childhood pna/ asthma or knowledge of premature birth.  Current Allergies, Complete Past Medical History, Past Surgical History, Family History, and Social History were reviewed in [employer] record.  ROS  The following are not active complaints unless bolded Hoarseness, sore throat, dysphagia, dental problems, itching, sneezing,  nasal congestion or discharge of excess mucus or purulent secretions, ear  ache,   fever, chills, sweats, unintended wt loss or wt gain, classically pleuritic or exertional cp,  orthopnea pnd or arm/hand swelling  or leg swelling, presyncope, palpitations, abdominal pain, anorexia, nausea, vomiting, diarrhea  or change in bowel habits or change in bladder habits, change in stools or change in urine, dysuria, hematuria,  rash, arthralgias, visual complaints, headache, numbness, weakness or ataxia or problems with walking or coordination,  change in mood or  memory.           Past Medical History:  Diagnosis Date  . Anemia   . Arthritis   . Diabetes mellitus    diet controlled  . GERD (gastroesophageal reflux disease)   . HTN (hypertension)   . HTN (hypertension)   . Port-A-Cath in place 01/06/2019  . Shortness of breath dyspnea    with exertion    Outpatient Medications Prior to Visit  Medication Sig Dispense Refill  . albuterol (VENTOLIN HFA) 108 (90 Base) MCG/ACT inhaler TAKE 2 PUFFS BY MOUTH EVERY 6 HOURS AS NEEDED FOR WHEEZE OR SHORTNESS OF BREATH 8 g 6  . ALPRAZolam (XANAX) 0.25 MG tablet Take 0.25 mg by mouth as needed.     Marland Kitchen amLODipine (NORVASC) 5 MG tablet Take 5 mg by mouth every morning.     . Calcium Carbonate Antacid (TUMS PO) Take 4 tablets by mouth daily.     . Cyanocobalamin (VITAMIN B-12) 2500 MCG SUBL Place 2,500 mcg under the tongue every morning.     Marland Kitchen  diphenhydrAMINE (BENADRYL) 25 MG tablet Take 25 mg by mouth every 6 (six) hours as needed.     Hunt Oris IV Inject into the vein every 14 (fourteen) days.    . ferrous sulfate 325 (65 FE) MG tablet Take 1 tablet (325 mg total) by mouth 3 (three) times daily after meals. (Patient taking differently: Take 325 mg by mouth daily. )    . gabapentin (NEURONTIN) 300 MG capsule Take 300 mg by mouth at bedtime as needed for pain.     Marland Kitchen ibuprofen (ADVIL,MOTRIN) 800 MG tablet Take 1 tablet (800 mg total) by mouth 3 (three) times daily. 90 tablet 2  . Multiple Vitamin (MULITIVITAMIN WITH MINERALS) TABS  Take 1 tablet by mouth every morning.     . naproxen (NAPROSYN) 500 MG tablet Take 500 mg by mouth as needed.     Marland Kitchen omeprazole (PRILOSEC) 20 MG capsule Take 20 mg by mouth daily.    .        No facility-administered medications prior to visit.     Objective:     BP (!) 130/80 (BP Location: Left Arm, Patient Position: Sitting, Cuff Size: Large)   Pulse 98   Temp (!) 96.8 F (36 C) (Oral)   Ht 5\' 2"  (1.575 m)   Wt (!) 289 lb (131.1 kg)   SpO2 91%   BMI 52.86 kg/m   SpO2: 91 %  RA'   Wt Readings from Last 3 Encounters:  10/09/19 (!) 289 lb (131.1 kg)  10/02/19 289 lb 0.4 oz (131.1 kg)  09/04/19 292 lb 3.2 oz (132.5 kg)     Obese very pleasant amb bf nad    HEENT : pt wearing mask not removed for exam due to covid -19 concerns.    NECK :  without JVD/Nodes/TM/ nl carotid upstrokes bilaterally   LUNGS: no acc muscle use,  Nl contour chest which is clear to A and P bilaterally without cough on insp or exp maneuvers   CV:  RRR  no s3  III/VI SEM ? Reduced P2 s  increase in P2, and trace bilateral lower ext  edema   ABD:  Obese soft and nontender with nl inspiratory excursion in the supine position. No bruits or organomegaly appreciated, bowel sounds nl  MS:  Nl gait/ ext warm without deformities, calf tenderness, cyanosis or clubbing No obvious joint restrictions   SKIN: warm and dry without lesions    NEURO:  alert, approp, nl sensorium with  no motor or cerebellar deficits apparent.     I personally reviewed images and agree with radiology impression as follows:   Chest CT with contrast 09/29/19 1. Stable appearance of treated lung lesion within the posterior left upper lobe. 2. Interval development of new bandlike area of increased soft tissue density within the anterior to the treated lung lesion. This is favored to represent progressive changes of external beam radiation. Local tumor recurrence is less favored but cannot be excluded based on today's exam.  Consider short-term interval follow-up versus PET-CT. 3. Stable pericardial effusion. 4. Three vessel coronary artery atherosclerotic calcifications. 5. Aortic atherosclerosis.   Labs  reviewed:      Chemistry      Component Value Date/Time   NA 141 10/02/2019 0945   K 4.0 10/02/2019 0945   CL 108 10/02/2019 0945   CO2 25 10/02/2019 0945   BUN 12 10/02/2019 0945   CREATININE 0.71 10/02/2019 0945      Component Value Date/Time   CALCIUM 8.8 (  L) 10/02/2019 0945   ALKPHOS 80 10/02/2019 0945   AST 17 10/02/2019 0945   ALT 18 10/02/2019 0945   BILITOT 0.4 10/02/2019 0945        Lab Results  Component Value Date   WBC 5.0 10/02/2019   HGB 11.8 (L) 10/02/2019   HCT 38.6 10/02/2019   MCV 87.7 10/02/2019   PLT 327 10/02/2019       Lab Results  Component Value Date   TSH 1.947 10/02/2019            Assessment   DOE (dyspnea on exertion) Onset around Jan 1st 2021 / slowly progressive Echo 05/15/19 Left ventricular ejection fraction, by estimation, is 60 to 65%. The  left ventricle has normal function. The left ventricle has no regional  wall motion abnormalities. There is moderate left ventricular hypertrophy.  Left ventricular diastolic  parameters are consistent with Grade I diastolic dysfunction (impaired  relaxation). Elevated left atrial pressure.  2. Right ventricular systolic function is normal. The right ventricular  size is normal. There is normal pulmonary artery systolic pressure.  3. Left atrial size was moderately dilated.  4. The mitral valve is normal in structure and function. No evidence of  mitral valve regurgitation. No evidence of mitral stenosis.  5. The aortic valve is tricuspid. Aortic valve regurgitation is mild.  Moderate aortic valve stenosis.Aortic valve mean gradient measures 21.0  mmHg. Aortic valve peak gradient measures 36.2 mmHg. Aortic valve area, by  VTI measures 1.27 cm  6. The inferior vena cava is normal in size with  greater than 50%  respiratory variability, suggesting right atrial pressure of 3 mmHg.  7. Cannot exclude small PFO with left to right shunt.  -  10/09/2019   Walked RA  approx   200 ft  @ moderate pace  stopped due to  Sob with sats 92%   Clearly multifactorial:  diastolic dysfunction with AS/ Mod LAE since at least 04/2019 combined with obesity/ conditioning and RT lung injury (mostly to upper zone on L which is the smallest and least important lobe in the lung ) in that order should be considered here and given the echo findings I would like her to see cards before additonal pulmonary eval which should include PFT's to be complete.  However: NB When respiratory symptoms begin or become refractory well after a patient reports complete smoking cessation,  Especially when this wasn't the case while they were smoking, a red flag is raised based on the work of Dr Kris Mouton which states:  if you quit smoking when your best day FEV1 is still well preserved it is highly unlikely you will progress to severe disease. That is to say, once the smoking stops,  the symptoms should not suddenly erupt or markedly worsen.  If so, the differential diagnosis should include  obesity/deconditioning,  LPR/Reflux/Aspiration syndromes,  occult CHF, or  especially side effect of medications commonly used in this population (radiation chemo)      Morbid obesity with BMI of 45.0-49.9, adult (HCC) Body mass index is 52.86 kg/m.   Lab Results  Component Value Date   TSH 1.947 10/02/2019    Contributing to gerd risk/ doe/reviewed the need and the process to achieve and maintain neg calorie balance > defer f/u primary care including intermittently monitoring thyroid status          Each maintenance medication was reviewed in detail including emphasizing most importantly the difference between maintenance and prns and under what circumstances the prns  are to be triggered using an action plan format where  appropriate.  Total time for H and P, chart review, counseling  directly observing portions of ambulatory 02 saturation study/  and generating customized AVS unique to this office visit / charting = 60 min       Christinia Gully, MD 10/09/2019

## 2019-10-17 ENCOUNTER — Inpatient Hospital Stay (HOSPITAL_COMMUNITY): Payer: Medicare Other

## 2019-10-17 ENCOUNTER — Inpatient Hospital Stay (HOSPITAL_BASED_OUTPATIENT_CLINIC_OR_DEPARTMENT_OTHER): Payer: Medicare Other | Admitting: Hematology

## 2019-10-17 VITALS — BP 113/52 | HR 91 | Temp 97.8°F | Resp 18

## 2019-10-17 VITALS — BP 121/63 | HR 73 | Temp 97.2°F | Resp 20 | Wt 286.4 lb

## 2019-10-17 DIAGNOSIS — Z5112 Encounter for antineoplastic immunotherapy: Secondary | ICD-10-CM | POA: Diagnosis not present

## 2019-10-17 DIAGNOSIS — I10 Essential (primary) hypertension: Secondary | ICD-10-CM | POA: Diagnosis not present

## 2019-10-17 DIAGNOSIS — C3492 Malignant neoplasm of unspecified part of left bronchus or lung: Secondary | ICD-10-CM

## 2019-10-17 DIAGNOSIS — Z95828 Presence of other vascular implants and grafts: Secondary | ICD-10-CM

## 2019-10-17 DIAGNOSIS — E119 Type 2 diabetes mellitus without complications: Secondary | ICD-10-CM | POA: Diagnosis not present

## 2019-10-17 DIAGNOSIS — Z79899 Other long term (current) drug therapy: Secondary | ICD-10-CM | POA: Diagnosis not present

## 2019-10-17 DIAGNOSIS — C3412 Malignant neoplasm of upper lobe, left bronchus or lung: Secondary | ICD-10-CM | POA: Diagnosis not present

## 2019-10-17 LAB — COMPREHENSIVE METABOLIC PANEL
ALT: 26 U/L (ref 0–44)
AST: 20 U/L (ref 15–41)
Albumin: 3.6 g/dL (ref 3.5–5.0)
Alkaline Phosphatase: 94 U/L (ref 38–126)
Anion gap: 9 (ref 5–15)
BUN: 16 mg/dL (ref 8–23)
CO2: 25 mmol/L (ref 22–32)
Calcium: 8.9 mg/dL (ref 8.9–10.3)
Chloride: 106 mmol/L (ref 98–111)
Creatinine, Ser: 0.79 mg/dL (ref 0.44–1.00)
GFR calc Af Amer: >60 mL/min (ref >60)
GFR calc non Af Amer: >60 mL/min (ref >60)
Glucose, Bld: 116 mg/dL — ABNORMAL HIGH (ref 70–99)
Potassium: 4 mmol/L (ref 3.5–5.1)
Sodium: 140 mmol/L (ref 135–145)
Total Bilirubin: 0.4 mg/dL (ref 0.3–1.2)
Total Protein: 6.6 g/dL (ref 6.5–8.1)

## 2019-10-17 LAB — CBC WITH DIFFERENTIAL/PLATELET
Abs Immature Granulocytes: 0.01 10*3/uL (ref 0.00–0.07)
Basophils Absolute: 0 10*3/uL (ref 0.0–0.1)
Basophils Relative: 1 %
Eosinophils Absolute: 0.1 10*3/uL (ref 0.0–0.5)
Eosinophils Relative: 2 %
HCT: 38.2 % (ref 36.0–46.0)
Hemoglobin: 11.7 g/dL — ABNORMAL LOW (ref 12.0–15.0)
Immature Granulocytes: 0 %
Lymphocytes Relative: 11 %
Lymphs Abs: 0.6 10*3/uL — ABNORMAL LOW (ref 0.7–4.0)
MCH: 26.9 pg (ref 26.0–34.0)
MCHC: 30.6 g/dL (ref 30.0–36.0)
MCV: 87.8 fL (ref 80.0–100.0)
Monocytes Absolute: 0.3 10*3/uL (ref 0.1–1.0)
Monocytes Relative: 6 %
Neutro Abs: 4.3 10*3/uL (ref 1.7–7.7)
Neutrophils Relative %: 80 %
Platelets: 347 10*3/uL (ref 150–400)
RBC: 4.35 MIL/uL (ref 3.87–5.11)
RDW: 14.4 % (ref 11.5–15.5)
WBC: 5.3 10*3/uL (ref 4.0–10.5)
nRBC: 0 % (ref 0.0–0.2)

## 2019-10-17 LAB — TSH: TSH: 1.619 u[IU]/mL (ref 0.350–4.500)

## 2019-10-17 MED ORDER — SODIUM CHLORIDE 0.9 % IV SOLN: Freq: Once | INTRAVENOUS | Status: AC

## 2019-10-17 MED ORDER — SODIUM CHLORIDE 0.9 % IV SOLN
10.0000 mg/kg | Freq: Once | INTRAVENOUS | Status: AC
  Administered 2019-10-17: 1240 mg via INTRAVENOUS
  Filled 2019-10-17: qty 4.8

## 2019-10-17 MED ORDER — SODIUM CHLORIDE 0.9% FLUSH
10.0000 mL | INTRAVENOUS | Status: DC | PRN
  Administered 2019-10-17: 10 mL

## 2019-10-17 MED ORDER — FUROSEMIDE 20 MG PO TABS
20.0000 mg | ORAL_TABLET | ORAL | 0 refills | Status: DC | PRN
Start: 2019-10-17 — End: 2019-11-10

## 2019-10-17 MED ORDER — HEPARIN SOD (PORK) LOCK FLUSH 100 UNIT/ML IV SOLN
500.0000 [IU] | Freq: Once | INTRAVENOUS | Status: AC | PRN
  Administered 2019-10-17: 500 [IU]

## 2019-10-17 NOTE — Progress Notes (Signed)
[id number] Labs reviewed with and pt seen by Dr. Delton Coombes and pt approved for Imfinzi infusion today per MD                                     [name] tolerated Imfinzi infusion well without complaints or incident. VSS upon discharge. Pt discharged self ambulatory using her cane in satisfactory condition

## 2019-10-17 NOTE — Patient Instructions (Signed)
[place] at [facility] Discharge Instructions  You were seen and examined by Dr. Delton Coombes today. You report that you have noticed improvements in your breathing. You reported that you have recently been diagnosed with arthritis in both knees by an Orthopedist. Dr. Delton Coombes noticed swelling in your feet especially. Dr. Delton Coombes has prescribed Lasix as needed for when you notice swelling.  You will see Dr. Delton Coombes again in 4 weeks.  Thank you for choosing [place] at [facility] to provide your oncology and hematology care.  To afford each patient quality time with our provider, please arrive at least 15 minutes before your scheduled appointment time.   If you have a lab appointment with the [facility] please come in thru the Main Entrance and check in at the main information desk.  You need to re-schedule your appointment should you arrive 10 or more minutes late.  We strive to give you quality time with our providers, and arriving late affects you and other patients whose appointments are after yours.  Also, if you no show three or more times for appointments you may be dismissed from the clinic at the providers discretion.     Again, thank you for choosing [facility].  Our hope is that these requests will decrease the amount of time that you wait before being seen by our physicians.       _____________________________________________________________  Should you have questions after your visit to [facility], please contact our office at [phone] and follow the prompts.  Our office hours are 8:00 a.m. and 4:30 p.m. Monday - Friday.  Please note that voicemails left after 4:00 p.m. may not be returned until the following business day.  We are closed weekends and major holidays.  You do have access to a nurse 24-7, just call the main number to the clinic [phone] and do not press any options, hold on the  line and a nurse will answer the phone.    For prescription refill requests, have your pharmacy contact our office and allow 72 hours.    Due to Covid, you will need to wear a mask upon entering the hospital. If you do not have a mask, a mask will be given to you at the Main Entrance upon arrival. For doctor visits, patients may have 1 support person age 61 or older with them. For treatment visits, patients can not have anyone with them due to social distancing guidelines and our immunocompromised population.

## 2019-10-17 NOTE — Progress Notes (Signed)
[place], [place] [address]   CLINIC:  Medical Oncology/Hematology  PCP:  Denise [name], [address] / [place] [id number] [phone]   REASON FOR VISIT:  Follow-up for stage III left lung adenocarcinoma  PRIOR THERAPY:  1. Chemoradiation with carboplatin and paclitaxel from 01/07/2019 to 02/11/2019 2. Consolidation with durvalumab from 03/19/2019 to 04/16/2019, held due to pneumonitis  CURRENT THERAPY: Consolidation with durvalumab  BRIEF ONCOLOGIC HISTORY:  Oncology History  Adenocarcinoma of left lung ([facility])  12/09/2018 Initial Diagnosis   Adenocarcinoma of left lung ([facility])   01/02/2019 Cancer Staging   Staging form: Lung, AJCC 8th Edition - Clinical stage from 01/02/2019: Stage IIIB (cT3, cN2, cM0) - Signed by Derek Jack, MD on 01/02/2019   01/07/2019 - 02/17/2019 Chemotherapy   The patient had palonosetron (ALOXI) injection 0.25 mg, 0.25 mg, Intravenous,  Once, 6 of 6 cycles Administration: 0.25 mg (01/07/2019), 0.25 mg (01/14/2019), 0.25 mg (01/21/2019), 0.25 mg (01/28/2019), 0.25 mg (02/04/2019), 0.25 mg (02/11/2019) CARBOplatin (PARAPLATIN) 270 mg in sodium chloride 0.9 % 250 mL chemo infusion, 270 mg (100 % of original dose 266.4 mg), Intravenous,  Once, 6 of 6 cycles Dose modification:   (original dose 266.4 mg, Cycle 1),   (original dose 266.4 mg, Cycle 2),   (original dose 266.4 mg, Cycle 3),   (original dose 266.4 mg, Cycle 4) Administration: 270 mg (01/07/2019), 270 mg (01/14/2019), 270 mg (01/21/2019), 270 mg (01/28/2019), 270 mg (02/04/2019), 270 mg (02/11/2019) PACLitaxel (TAXOL) 108 mg in sodium chloride 0.9 % 250 mL chemo infusion (</= 80mg /m2), 45 mg/m2 = 108 mg, Intravenous,  Once, 6 of 6 cycles Administration: 108 mg (01/07/2019), 108 mg (01/14/2019), 108 mg (01/21/2019), 108 mg (01/28/2019), 108 mg (02/04/2019), 108 mg (02/11/2019) fosaprepitant (EMEND) 150 mg, dexamethasone (DECADRON) 12 mg in  sodium chloride 0.9 % 145 mL IVPB, , Intravenous,  Once, 5 of 5 cycles Administration:  (01/14/2019),  (01/21/2019),  (01/28/2019),  (02/04/2019),  (02/11/2019)  for chemotherapy treatment.    03/19/2019 -  Chemotherapy   The patient had durvalumab (IMFINZI) 1,240 mg in sodium chloride 0.9 % 100 mL chemo infusion, 9.9 mg/kg = 1,260 mg, Intravenous,  Once, 9 of 12 cycles Administration: 1,240 mg (03/19/2019), 1,240 mg (04/02/2019), 1,240 mg (04/16/2019), 1,240 mg (07/24/2019), 1,240 mg (08/07/2019), 1,240 mg (08/21/2019), 1,240 mg (09/04/2019), 1,240 mg (09/19/2019), 1,240 mg (10/02/2019)  for chemotherapy treatment.      CANCER STAGING: Cancer Staging Adenocarcinoma of left lung [facility]) Staging form: Lung, AJCC 8th Edition - Clinical stage from 01/02/2019: Stage IIIB (cT3, cN2, cM0) - Signed by Derek Jack, MD on 01/02/2019   INTERVAL HISTORY:  Denise [name], a 69 y.o. female, returns for routine follow-up and consideration for next cycle of chemotherapy. Denise [name] was last seen on 10/02/2019.  Due for cycle #10 of durvalumab today.   Today she reports that her SOB has improved and she has cut down on her albuterol use. She had an XR of her right knee which showed degeneration with bone-on-bone grinding; she is ambulating with a cane and walking slower, which helps with her SOB. She notices that her feet swell as the day continues. She used to be on a diuretic 2-3 years ago but does not remember if it helped with her swelling.  Overall, she feels ready for next cycle of chemo today.    REVIEW OF SYSTEMS:  Review of Systems  Constitutional: Positive for fatigue (moderate). Negative for appetite change.  Respiratory: Positive for shortness of breath (w/ exertion).   Cardiovascular: Positive for leg swelling.  Musculoskeletal: Positive for arthralgias (10/10 R knee pain).  All other systems reviewed and are negative.   PAST MEDICAL/SURGICAL HISTORY:  Past Medical History:  Diagnosis Date    Anemia    Arthritis    Diabetes mellitus    diet controlled   GERD (gastroesophageal reflux disease)    HTN (hypertension)    HTN (hypertension)    Port-A-Cath in place 01/06/2019   Shortness of breath dyspnea    with exertion   Past Surgical History:  Procedure Laterality Date   CHOLECYSTECTOMY  1997   COLONOSCOPY     GASTRIC BYPASS     INCISIONAL HERNIA REPAIR  04/11/11   IR IMAGING GUIDED PORT INSERTION  12/27/2018   LAPAROSCOPIC SALPINGOOPHERECTOMY     LAPAROTOMY  04/11/2011   Procedure: EXPLORATORY LAPAROTOMY;  Surgeon: Joyice Faster. Cornett, MD;  Location: [facility];  Service: General;  Laterality: N/A;  closure port hole   TOTAL HIP ARTHROPLASTY  03/07/2012   Procedure: TOTAL HIP ARTHROPLASTY ANTERIOR APPROACH;  Surgeon: Mauri Pole, MD;  Location: [facility];  Service: Orthopedics;  Laterality: Right;   TOTAL SHOULDER ARTHROPLASTY Left 01/21/2015   TOTAL SHOULDER ARTHROPLASTY Left 01/21/2015   Procedure: LEFT TOTAL SHOULDER ARTHROPLASTY;  Surgeon: Justice Britain, MD;  Location: [facility];  Service: Orthopedics;  Laterality: Left;   VAGINAL HYSTERECTOMY      SOCIAL HISTORY:  Social History   Socioeconomic History   Marital status: Single    Spouse name: Not on file   Number of children: Not on file   Years of education: 12th grade   Highest education level: Not on file  Occupational History   Occupation: Employed    Employer: [employer]  Tobacco Use   Smoking status: Former Smoker    Packs/day: 1.00    Years: 12.00    Pack years: 12.00    Types: Cigarettes    Quit date: 03/20/1976    Years since quitting: 43.6   Smokeless tobacco: Never Used  Vaping Use   Vaping Use: Never used  Substance and Sexual Activity   Alcohol use: No   Drug use: No   Sexual activity: Never  Other Topics Concern   Not on file  Social History Narrative   Not on file   Social Determinants of Health   Financial Resource Strain: Low Risk     Difficulty of Paying Living Expenses: Not very hard  Food Insecurity: No Food Insecurity   Worried About Charity fundraiser in the Last Year: Never true   [place] in the Last Year: Never true  Transportation Needs: No Transportation Needs   Lack of Transportation (Medical): No   Lack of Transportation (Non-Medical): No  Physical Activity: Inactive   Days of Exercise per Week: 0 days   Minutes of Exercise per Session: 0 min  Stress: No Stress Concern Present   Feeling of Stress : Only a little  Social Connections: Moderately Isolated   Frequency of Communication with Friends and Family: More than three times a week   Frequency of Social Gatherings with Friends and Family: Once a week   Attends Religious Services: More than 4 times per year   Active Member of [employer] or Organizations: No   Attends Archivist Meetings: Never   Marital Status: Never married  Human resources officer Violence: Not At Risk   Fear of Current or Ex-Partner: No  Emotionally Abused: No   Physically Abused: No   Sexually Abused: No    FAMILY HISTORY:  Family History  Problem Relation Age of Onset   Breast cancer Mother    COPD Mother    Arthritis Mother    Diabetes Mother    Hypertension Mother    Hypertension Father    Diabetes Father    Breast cancer Sister    Thyroid cancer Brother    Huntington's disease Maternal Grandmother    Heart attack Brother     CURRENT MEDICATIONS:  Current Outpatient Medications  Medication Sig Dispense Refill   amLODipine (NORVASC) 5 MG tablet Take 5 mg by mouth every morning.      Calcium Carbonate Antacid (TUMS PO) Take 4 tablets by mouth daily.      Cyanocobalamin (VITAMIN B-12) 2500 MCG SUBL Place 2,500 mcg under the tongue every morning.      DURVALUMAB IV Inject into the vein every 14 (fourteen) days.     ferrous sulfate 325 (65 FE) MG tablet Take 1 tablet (325 mg total) by mouth 3 (three) times daily after meals.  (Patient taking differently: Take 325 mg by mouth daily. )     gabapentin (NEURONTIN) 300 MG capsule Take 300 mg by mouth at bedtime as needed for pain.      ibuprofen (ADVIL,MOTRIN) 800 MG tablet Take 1 tablet (800 mg total) by mouth 3 (three) times daily. 90 tablet 2   Multiple Vitamin (MULITIVITAMIN WITH MINERALS) TABS Take 1 tablet by mouth every morning.      naproxen (NAPROSYN) 500 MG tablet Take 500 mg by mouth as needed.      omeprazole (PRILOSEC) 20 MG capsule Take 20 mg by mouth daily.     albuterol (VENTOLIN HFA) 108 (90 Base) MCG/ACT inhaler TAKE 2 PUFFS BY MOUTH EVERY 6 HOURS AS NEEDED FOR WHEEZE OR SHORTNESS OF BREATH (Patient not taking: Reported on 10/17/2019) 8 g 6   ALPRAZolam (XANAX) 0.25 MG tablet Take 0.25 mg by mouth as needed.  (Patient not taking: Reported on 10/17/2019)     diphenhydrAMINE (BENADRYL) 25 MG tablet Take 25 mg by mouth every 6 (six) hours as needed.  (Patient not taking: Reported on 10/17/2019)     No current facility-administered medications for this visit.    ALLERGIES:  No Known Allergies  PHYSICAL EXAM:  Performance status (ECOG): 1 - Symptomatic but completely ambulatory  Vitals:   10/17/19 0910  BP: (!) 121/63  Pulse: 73  Resp: 20  Temp: (!) 97.2 F (36.2 C)  SpO2: 100%   Wt Readings from Last 3 Encounters:  10/17/19 (!) 286 lb 6.4 oz (129.9 kg)  10/09/19 (!) 289 lb (131.1 kg)  10/02/19 289 lb 0.4 oz (131.1 kg)   Physical Exam Constitutional:      Appearance: Normal appearance. She is obese.  Cardiovascular:     Rate and Rhythm: Normal rate and regular rhythm.     Pulses: Normal pulses.     Heart sounds: Normal heart sounds.  Pulmonary:     Effort: Pulmonary effort is normal.     Breath sounds: Normal breath sounds.  Chest:     Comments: Port-a-Cath on R chest Musculoskeletal:     Right lower leg: Edema (2+) present.     Left lower leg: Edema (2+) present.  Neurological:     General: No focal deficit present.      Mental Status: She is alert and oriented to person, place, and time.  Psychiatric:  Mood and Affect: Mood normal.        Behavior: Behavior normal.     LABORATORY DATA:  I have reviewed the labs as listed.  CBC Latest Ref Rng & Units 10/17/2019 10/02/2019 09/19/2019  WBC 4.0 - 10.5 K/uL 5.3 5.0 4.1  Hemoglobin 12.0 - 15.0 g/dL 11.7(L) 11.8(L) 11.4(L)  Hematocrit 36 - 46 % 38.2 38.6 36.9  Platelets 150 - 400 K/uL 347 327 314   CMP Latest Ref Rng & Units 10/17/2019 10/02/2019 09/19/2019  Glucose 70 - 99 mg/dL 116(H) 119(H) 113(H)  BUN 8 - 23 mg/dL 16 12 16   Creatinine 0.44 - 1.00 mg/dL 0.79 0.71 0.70  Sodium 135 - 145 mmol/L 140 141 141  Potassium 3.5 - 5.1 mmol/L 4.0 4.0 4.0  Chloride 98 - 111 mmol/L 106 108 106  CO2 22 - 32 mmol/L 25 25 26   Calcium 8.9 - 10.3 mg/dL 8.9 8.8(L) 8.9  Total Protein 6.5 - 8.1 g/dL 6.6 6.3(L) 6.3(L)  Total Bilirubin 0.3 - 1.2 mg/dL 0.4 0.4 0.4  Alkaline Phos 38 - 126 U/L 94 80 79  AST 15 - 41 U/L 20 17 17   ALT 0 - 44 U/L 26 18 18     DIAGNOSTIC IMAGING:  I have independently reviewed the scans and discussed with the patient. CT Chest W Contrast  Result Date: 09/29/2019 CLINICAL DATA:  Follow-up pulmonary adenocarcinoma. EXAM: CT CHEST WITH CONTRAST TECHNIQUE: Multidetector CT imaging of the chest was performed during intravenous contrast administration. CONTRAST:  68mL OMNIPAQUE IOHEXOL 300 MG/ML  SOLN COMPARISON:  07/02/2019 FINDINGS: Cardiovascular: Mild cardiac enlargement. Pericardial effusion is again noted and appears similar. Aortic atherosclerosis. Three vessel coronary artery atherosclerotic calcifications. Mediastinum/Nodes: Normal appearance of the thyroid gland. The trachea appears patent and is midline. Normal appearance of the esophagus. No axillary, supraclavicular, mediastinal, or hilar adenopathy. Lungs/Pleura: Post treatment changes within the left upper lobe including fibrosis, masslike architectural distortion, and volume loss. Treated  lung lesion within the posterior left upper lobe measures 2.7 x 2.1 cm, image 35/4. Unchanged when compared with the previous exam. Anterior and medial to the treated lung lesion is a new bandlike area of increased soft tissue measuring 3.2 x 1.4 x 3.6 cm, image 32/4 and image 137/6. Upper Abdomen: Status post cholecystectomy with increase caliber of the CBD. Previous gastric bypass. No acute findings within the imaged portions of the upper abdomen. Unchanged cyst within left hepatic lobe measuring 1.3 cm. Musculoskeletal: Spondylosis identified within the thoracic spine. No acute or aggressive bone lesions. Advanced degenerative changes noted involving the right glenohumeral joint. Previous left shoulder arthroplasty. IMPRESSION: 1. Stable appearance of treated lung lesion within the posterior left upper lobe. 2. Interval development of new bandlike area of increased soft tissue density within the anterior to the treated lung lesion. This is favored to represent progressive changes of external beam radiation. Local tumor recurrence is less favored but cannot be excluded based on today's exam. Consider short-term interval follow-up versus PET-CT. 3. Stable pericardial effusion. 4. Three vessel coronary artery atherosclerotic calcifications. 5. Aortic atherosclerosis. Aortic Atherosclerosis (ICD10-I70.0). Electronically Signed   By: Kerby Moors M.D.   On: 09/29/2019 16:51   US Venous Img Lower Unilateral Right  Result Date: 10/02/2019 CLINICAL DATA:  Right lower extremity pain and edema. Former smoker. History of lung cancer. Evaluate for DVT. EXAM: RIGHT LOWER EXTREMITY VENOUS DOPPLER ULTRASOUND TECHNIQUE: Gray-scale sonography with graded compression, as well as color Doppler and duplex ultrasound were performed to evaluate the lower extremity deep venous  systems from the level of the common femoral vein and including the common femoral, femoral, profunda femoral, popliteal and calf veins including the  posterior tibial, peroneal and gastrocnemius veins when visible. The superficial great saphenous vein was also interrogated. Spectral Doppler was utilized to evaluate flow at rest and with distal augmentation maneuvers in the common femoral, femoral and popliteal veins. COMPARISON:  None. FINDINGS: Contralateral Common Femoral Vein: Respiratory phasicity is normal and symmetric with the symptomatic side. No evidence of thrombus. Normal compressibility. Common Femoral Vein: No evidence of thrombus. Normal compressibility, respiratory phasicity and response to augmentation. Saphenofemoral Junction: No evidence of thrombus. Normal compressibility and flow on color Doppler imaging. Profunda Femoral Vein: No evidence of thrombus. Normal compressibility and flow on color Doppler imaging. Femoral Vein: No evidence of thrombus. Normal compressibility, respiratory phasicity and response to augmentation. Popliteal Vein: No evidence of thrombus. Normal compressibility, respiratory phasicity and response to augmentation. Calf Veins: No evidence of thrombus. Normal compressibility and flow on color Doppler imaging. Superficial Great Saphenous Vein: No evidence of thrombus. Normal compressibility. Venous Reflux:  None. Other Findings:  None. IMPRESSION: No evidence of DVT within the right lower extremity. Electronically Signed   By: Sandi Mariscal M.D.   On: 10/02/2019 14:30     ASSESSMENT:  1.Adenocarcinoma of left lung ([facility]) -Chemoradiation therapy with carboplatin and paclitaxel from 01/07/2019 through 02/11/2019. -Consolidation immunotherapy with durvalumab from 03/19/2019 through 04/15/2018, held due to pneumonitis. -CT chest on 07/02/2019 shows left upper lobe lung mass measuring 2.6 x 2.0 cm. It shows improvement in size. -MRI of the brain on 07/18/2019 showed left sphenoid wing meningioma measuring 2.6 x 2.0 x 2.6 cm unchanged. No new enhancing intracranial lesion. Increased left temporal white matter  edema. -Durvalumab restarted on 07/24/2019. -We reviewed CT chest with contrast from 09/29/2019 which showed stable appearance of treated lung lesion within the posterior left upper lobe.  New bandlike area of increased soft tissue density within the anterior to the treated lung lesion favored to be progressive changes from radiation.  Local tumor recurrence is less favored.  Stable pericardial effusion.   PLAN:  1. Clinical stage IIIb (T3N2) adenocarcinoma of the lung: -She reported slight improvement in her breathing.  I have reviewed her labs which are grossly within normal limits. As she does not have any immunotherapy related side effects. -She will proceed with her treatment today and in 2 weeks.  I will see her back in 4 weeks for follow-up.  2. Shortness of breath on exertion: -She was evaluated by Dr. Melvyn Novas.  Albuterol was discontinued.  3. Radiation pneumonitis: -Prednisone was tapered off on 07/21/2019.  Breathing improved.  4. Meningioma: -No left hemifacial pain.  Watchful waiting recommended.  5.  Hypertension: -Continue Norvasc.  6.  Bilateral leg swelling: -Recent lower extremity Doppler was negative for clots.  We will start her on Lasix 20 mg daily as needed.  7.  Bilateral knee pains, right more than left: -She was evaluated by Dr. Para March and was given injections into the knees.  She was told that she has severe arthritis. -She reports improvement in pain in the left knee but not in the right knee.  She will reach out to him.   Orders placed this encounter:  No orders of the defined types were placed in this encounter.    Derek Jack, MD [place] [phone]   I, Milinda Antis, am acting as a scribe for Dr. Sanda Linger.  Kinnie Scales MD, have reviewed  the above documentation for accuracy and completeness, and I agree with the above.

## 2019-10-17 NOTE — Patient Instructions (Signed)
[facility] Discharge Instructions for Patients Receiving Chemotherapy   Beginning January 23rd 2017 lab work for the [facility] will be done in the  Main lab at [facility] on 1st floor. If you have a lab appointment with the [facility] please come in thru the  Main Entrance and check in at the main information desk   Today you received the following chemotherapy agents Imfinzi. Follow-up as scheduled  To help prevent nausea and vomiting after your treatment, we encourage you to take your nausea medication   If you develop nausea and vomiting, or diarrhea that is not controlled by your medication, call the clinic.  The clinic phone number is [phone]. Office hours are Monday-Friday 8:30am-5:00pm.  BELOW ARE SYMPTOMS THAT SHOULD BE REPORTED IMMEDIATELY:  *FEVER GREATER THAN 101.0 F  *CHILLS WITH OR WITHOUT FEVER  NAUSEA AND VOMITING THAT IS NOT CONTROLLED WITH YOUR NAUSEA MEDICATION  *UNUSUAL SHORTNESS OF BREATH  *UNUSUAL BRUISING OR BLEEDING  TENDERNESS IN MOUTH AND THROAT WITH OR WITHOUT PRESENCE OF ULCERS  *URINARY PROBLEMS  *BOWEL PROBLEMS  UNUSUAL RASH Items with * indicate a potential emergency and should be followed up as soon as possible. If you have an emergency after office hours please contact your primary care physician or go to the nearest emergency department.  Please call the clinic during office hours if you have any questions or concerns.   You may also contact the Patient Navigator at [phone] should you have any questions or need assistance in obtaining follow up care.      Resources For Cancer Patients and their Caregivers ? American Cancer Society: Can assist with transportation, wigs, general needs, runs Look Good Feel Better.        6364911258 ? Cancer Care: Provides financial assistance, online support groups, medication/co-pay assistance.  1-800-813-HOPE [phone]) ? [place] Assists [place] cancer patients and their families through emotional , educational and financial support.  7197967634 ? [place] DSS Where to apply for food stamps, Medicaid and utility assistance. [phone] ? [facility]: Transportation to medical appointments. [phone] ? Social Security Administration: May apply for disability if have a Stage IV cancer. [phone] ? [employer], Disability and Transit Services: Assists with nutrition, care and transit needs. [phone]

## 2019-10-20 DIAGNOSIS — M170 Bilateral primary osteoarthritis of knee: Secondary | ICD-10-CM | POA: Diagnosis not present

## 2019-10-20 DIAGNOSIS — R0602 Shortness of breath: Secondary | ICD-10-CM | POA: Diagnosis not present

## 2019-10-20 DIAGNOSIS — D630 Anemia in neoplastic disease: Secondary | ICD-10-CM | POA: Diagnosis not present

## 2019-10-20 DIAGNOSIS — R7303 Prediabetes: Secondary | ICD-10-CM | POA: Diagnosis not present

## 2019-10-20 DIAGNOSIS — C3412 Malignant neoplasm of upper lobe, left bronchus or lung: Secondary | ICD-10-CM | POA: Diagnosis not present

## 2019-10-31 ENCOUNTER — Other Ambulatory Visit: Payer: Self-pay

## 2019-10-31 ENCOUNTER — Encounter (HOSPITAL_COMMUNITY): Payer: Self-pay

## 2019-10-31 ENCOUNTER — Inpatient Hospital Stay (HOSPITAL_COMMUNITY): Payer: Medicare Other

## 2019-10-31 ENCOUNTER — Other Ambulatory Visit (HOSPITAL_COMMUNITY): Payer: Self-pay | Admitting: Nurse Practitioner

## 2019-10-31 ENCOUNTER — Inpatient Hospital Stay (HOSPITAL_COMMUNITY): Payer: Medicare Other | Attending: Hematology

## 2019-10-31 ENCOUNTER — Ambulatory Visit (HOSPITAL_COMMUNITY): Payer: Medicare Other | Admitting: Hematology

## 2019-10-31 VITALS — BP 126/67 | HR 74 | Temp 98.2°F | Resp 18

## 2019-10-31 DIAGNOSIS — I10 Essential (primary) hypertension: Secondary | ICD-10-CM | POA: Diagnosis not present

## 2019-10-31 DIAGNOSIS — Z79899 Other long term (current) drug therapy: Secondary | ICD-10-CM | POA: Diagnosis not present

## 2019-10-31 DIAGNOSIS — Z5112 Encounter for antineoplastic immunotherapy: Secondary | ICD-10-CM | POA: Insufficient documentation

## 2019-10-31 DIAGNOSIS — E119 Type 2 diabetes mellitus without complications: Secondary | ICD-10-CM | POA: Diagnosis not present

## 2019-10-31 DIAGNOSIS — Z95828 Presence of other vascular implants and grafts: Secondary | ICD-10-CM

## 2019-10-31 DIAGNOSIS — C3412 Malignant neoplasm of upper lobe, left bronchus or lung: Secondary | ICD-10-CM | POA: Diagnosis not present

## 2019-10-31 DIAGNOSIS — C3492 Malignant neoplasm of unspecified part of left bronchus or lung: Secondary | ICD-10-CM

## 2019-10-31 LAB — CBC WITH DIFFERENTIAL/PLATELET
Abs Immature Granulocytes: 0.01 10*3/uL (ref 0.00–0.07)
Basophils Absolute: 0 10*3/uL (ref 0.0–0.1)
Basophils Relative: 1 %
Eosinophils Absolute: 0.1 10*3/uL (ref 0.0–0.5)
Eosinophils Relative: 2 %
HCT: 37.5 % (ref 36.0–46.0)
Hemoglobin: 11.6 g/dL — ABNORMAL LOW (ref 12.0–15.0)
Immature Granulocytes: 0 %
Lymphocytes Relative: 11 %
Lymphs Abs: 0.6 10*3/uL — ABNORMAL LOW (ref 0.7–4.0)
MCH: 27.1 pg (ref 26.0–34.0)
MCHC: 30.9 g/dL (ref 30.0–36.0)
MCV: 87.6 fL (ref 80.0–100.0)
Monocytes Absolute: 0.4 10*3/uL (ref 0.1–1.0)
Monocytes Relative: 7 %
Neutro Abs: 4.1 10*3/uL (ref 1.7–7.7)
Neutrophils Relative %: 79 %
Platelets: 305 10*3/uL (ref 150–400)
RBC: 4.28 MIL/uL (ref 3.87–5.11)
RDW: 14.8 % (ref 11.5–15.5)
WBC: 5.1 10*3/uL (ref 4.0–10.5)
nRBC: 0 % (ref 0.0–0.2)

## 2019-10-31 LAB — COMPREHENSIVE METABOLIC PANEL
ALT: 20 U/L (ref 0–44)
AST: 15 U/L (ref 15–41)
Albumin: 3.3 g/dL — ABNORMAL LOW (ref 3.5–5.0)
Alkaline Phosphatase: 84 U/L (ref 38–126)
Anion gap: 9 (ref 5–15)
BUN: 19 mg/dL (ref 8–23)
CO2: 26 mmol/L (ref 22–32)
Calcium: 8.8 mg/dL — ABNORMAL LOW (ref 8.9–10.3)
Chloride: 108 mmol/L (ref 98–111)
Creatinine, Ser: 0.85 mg/dL (ref 0.44–1.00)
GFR calc Af Amer: >60 mL/min (ref >60)
GFR calc non Af Amer: >60 mL/min (ref >60)
Glucose, Bld: 102 mg/dL — ABNORMAL HIGH (ref 70–99)
Potassium: 3.8 mmol/L (ref 3.5–5.1)
Sodium: 143 mmol/L (ref 135–145)
Total Bilirubin: 0.5 mg/dL (ref 0.3–1.2)
Total Protein: 6.3 g/dL — ABNORMAL LOW (ref 6.5–8.1)

## 2019-10-31 LAB — HEMOGLOBIN A1C
Hgb A1c MFr Bld: 6.6 % — ABNORMAL HIGH (ref 4.8–5.6)
Mean Plasma Glucose: 142.72 mg/dL

## 2019-10-31 MED ORDER — SODIUM CHLORIDE 0.9 % IV SOLN
10.0000 mg/kg | Freq: Once | INTRAVENOUS | Status: AC
  Administered 2019-10-31: 1240 mg via INTRAVENOUS
  Filled 2019-10-31: qty 20

## 2019-10-31 MED ORDER — HEPARIN SOD (PORK) LOCK FLUSH 100 UNIT/ML IV SOLN
500.0000 [IU] | Freq: Once | INTRAVENOUS | Status: AC | PRN
  Administered 2019-10-31: 500 [IU]

## 2019-10-31 MED ORDER — SODIUM CHLORIDE 0.9 % IV SOLN: Freq: Once | INTRAVENOUS | Status: AC

## 2019-10-31 MED ORDER — SODIUM CHLORIDE 0.9% FLUSH
10.0000 mL | INTRAVENOUS | Status: DC | PRN
  Administered 2019-10-31: 10 mL

## 2019-10-31 NOTE — Patient Instructions (Signed)
[facility] Discharge Instructions for Patients Receiving Chemotherapy   Beginning January 23rd 2017 lab work for the [facility] will be done in the  Main lab at [facility] on 1st floor. If you have a lab appointment with the [facility] please come in thru the  Main Entrance and check in at the main information desk   Today you received the following chemotherapy agents Imfinzi. Follow-up as scheduled  To help prevent nausea and vomiting after your treatment, we encourage you to take your nausea medication   If you develop nausea and vomiting, or diarrhea that is not controlled by your medication, call the clinic.  The clinic phone number is [phone]. Office hours are Monday-Friday 8:30am-5:00pm.  BELOW ARE SYMPTOMS THAT SHOULD BE REPORTED IMMEDIATELY:  *FEVER GREATER THAN 101.0 F  *CHILLS WITH OR WITHOUT FEVER  NAUSEA AND VOMITING THAT IS NOT CONTROLLED WITH YOUR NAUSEA MEDICATION  *UNUSUAL SHORTNESS OF BREATH  *UNUSUAL BRUISING OR BLEEDING  TENDERNESS IN MOUTH AND THROAT WITH OR WITHOUT PRESENCE OF ULCERS  *URINARY PROBLEMS  *BOWEL PROBLEMS  UNUSUAL RASH Items with * indicate a potential emergency and should be followed up as soon as possible. If you have an emergency after office hours please contact your primary care physician or go to the nearest emergency department.  Please call the clinic during office hours if you have any questions or concerns.   You may also contact the Patient Navigator at [phone] should you have any questions or need assistance in obtaining follow up care.      Resources For Cancer Patients and their Caregivers ? American Cancer Society: Can assist with transportation, wigs, general needs, runs Look Good Feel Better.        210-813-7966 ? Cancer Care: Provides financial assistance, online support groups, medication/co-pay assistance.  1-800-813-HOPE 517-786-2661) ? [place] Assists [place] cancer patients and their families through emotional , educational and financial support.  641-519-7008 ? [place] DSS Where to apply for food stamps, Medicaid and utility assistance. 925 631 5697 ? [facility]: Transportation to medical appointments. ([phone] ? Social Security Administration: May apply for disability if have a Stage IV cancer. ([phone] ? [employer], Disability and Transit Services: Assists with nutrition, care and transit needs. [phone]

## 2019-10-31 NOTE — Progress Notes (Signed)
Patient presents today for treatment. Vital signs within parameters for treatment. Labs pending. Patient has no complaints of any  Changes since her last visit. Patient states her shortness of breath has improved. Patient has pain today, chronic, right knee that she said is the same. MAR reviewed and updated.   Labs within parameters for treatment.

## 2019-10-31 NOTE — Progress Notes (Signed)
Denise [name] tolerated Imfinzi infusion well without complaints or incident. VSS upon discharge. Pt discharged self ambulatory using her cane in satisfactory condition

## 2019-11-08 ENCOUNTER — Other Ambulatory Visit (HOSPITAL_COMMUNITY): Payer: Self-pay | Admitting: Hematology

## 2019-11-13 ENCOUNTER — Inpatient Hospital Stay (HOSPITAL_BASED_OUTPATIENT_CLINIC_OR_DEPARTMENT_OTHER): Payer: Medicare Other | Admitting: Hematology

## 2019-11-13 ENCOUNTER — Other Ambulatory Visit: Payer: Self-pay

## 2019-11-13 ENCOUNTER — Inpatient Hospital Stay (HOSPITAL_COMMUNITY): Payer: Medicare Other

## 2019-11-13 ENCOUNTER — Encounter (HOSPITAL_COMMUNITY): Payer: Self-pay | Admitting: Hematology

## 2019-11-13 VITALS — BP 121/68 | HR 67 | Temp 96.9°F | Resp 18

## 2019-11-13 VITALS — BP 123/70 | HR 67 | Temp 96.8°F | Resp 18 | Wt 282.6 lb

## 2019-11-13 DIAGNOSIS — I10 Essential (primary) hypertension: Secondary | ICD-10-CM | POA: Diagnosis not present

## 2019-11-13 DIAGNOSIS — Z79899 Other long term (current) drug therapy: Secondary | ICD-10-CM | POA: Diagnosis not present

## 2019-11-13 DIAGNOSIS — E119 Type 2 diabetes mellitus without complications: Secondary | ICD-10-CM | POA: Diagnosis not present

## 2019-11-13 DIAGNOSIS — C3492 Malignant neoplasm of unspecified part of left bronchus or lung: Secondary | ICD-10-CM

## 2019-11-13 DIAGNOSIS — Z95828 Presence of other vascular implants and grafts: Secondary | ICD-10-CM

## 2019-11-13 DIAGNOSIS — C3412 Malignant neoplasm of upper lobe, left bronchus or lung: Secondary | ICD-10-CM | POA: Diagnosis not present

## 2019-11-13 DIAGNOSIS — Z5112 Encounter for antineoplastic immunotherapy: Secondary | ICD-10-CM | POA: Diagnosis not present

## 2019-11-13 LAB — CBC WITH DIFFERENTIAL/PLATELET
Abs Immature Granulocytes: 0.01 10*3/uL (ref 0.00–0.07)
Basophils Absolute: 0 10*3/uL (ref 0.0–0.1)
Basophils Relative: 1 %
Eosinophils Absolute: 0.1 10*3/uL (ref 0.0–0.5)
Eosinophils Relative: 2 %
HCT: 37.4 % (ref 36.0–46.0)
Hemoglobin: 11.6 g/dL — ABNORMAL LOW (ref 12.0–15.0)
Immature Granulocytes: 0 %
Lymphocytes Relative: 12 %
Lymphs Abs: 0.6 10*3/uL — ABNORMAL LOW (ref 0.7–4.0)
MCH: 27.2 pg (ref 26.0–34.0)
MCHC: 31 g/dL (ref 30.0–36.0)
MCV: 87.6 fL (ref 80.0–100.0)
Monocytes Absolute: 0.4 10*3/uL (ref 0.1–1.0)
Monocytes Relative: 8 %
Neutro Abs: 3.6 10*3/uL (ref 1.7–7.7)
Neutrophils Relative %: 77 %
Platelets: 312 10*3/uL (ref 150–400)
RBC: 4.27 MIL/uL (ref 3.87–5.11)
RDW: 15.2 % (ref 11.5–15.5)
WBC: 4.7 10*3/uL (ref 4.0–10.5)
nRBC: 0 % (ref 0.0–0.2)

## 2019-11-13 LAB — COMPREHENSIVE METABOLIC PANEL
ALT: 21 U/L (ref 0–44)
AST: 16 U/L (ref 15–41)
Albumin: 3.4 g/dL — ABNORMAL LOW (ref 3.5–5.0)
Alkaline Phosphatase: 79 U/L (ref 38–126)
Anion gap: 7 (ref 5–15)
BUN: 15 mg/dL (ref 8–23)
CO2: 25 mmol/L (ref 22–32)
Calcium: 8.7 mg/dL — ABNORMAL LOW (ref 8.9–10.3)
Chloride: 109 mmol/L (ref 98–111)
Creatinine, Ser: 0.71 mg/dL (ref 0.44–1.00)
GFR calc Af Amer: >60 mL/min (ref >60)
GFR calc non Af Amer: >60 mL/min (ref >60)
Glucose, Bld: 108 mg/dL — ABNORMAL HIGH (ref 70–99)
Potassium: 3.8 mmol/L (ref 3.5–5.1)
Sodium: 141 mmol/L (ref 135–145)
Total Bilirubin: 0.4 mg/dL (ref 0.3–1.2)
Total Protein: 6.3 g/dL — ABNORMAL LOW (ref 6.5–8.1)

## 2019-11-13 MED ORDER — SODIUM CHLORIDE 0.9% FLUSH
10.0000 mL | INTRAVENOUS | Status: DC | PRN
  Administered 2019-11-13: 10 mL

## 2019-11-13 MED ORDER — HEPARIN SOD (PORK) LOCK FLUSH 100 UNIT/ML IV SOLN
500.0000 [IU] | Freq: Once | INTRAVENOUS | Status: AC | PRN
  Administered 2019-11-13: 500 [IU]

## 2019-11-13 MED ORDER — SODIUM CHLORIDE 0.9 % IV SOLN
10.0000 mg/kg | Freq: Once | INTRAVENOUS | Status: AC
  Administered 2019-11-13: 1240 mg via INTRAVENOUS
  Filled 2019-11-13: qty 20

## 2019-11-13 MED ORDER — SODIUM CHLORIDE 0.9 % IV SOLN: Freq: Once | INTRAVENOUS | Status: AC

## 2019-11-13 NOTE — Progress Notes (Signed)
Denise [name], [place] [address]   CLINIC:  Medical Oncology/Hematology  PCP:  Iona Beard, [address] / [place] [address] [phone]   REASON FOR VISIT:  Follow-up for stage III left lung adenocarcinoma  PRIOR THERAPY:  1. Chemoradiation with carboplatin and paclitaxel from 01/07/2019 to 02/11/2019. 2. Consolidation with durvalumab from 03/19/2019 to 04/16/2019, held due to pneumonitis.  NGS Results: Not done  CURRENT THERAPY: Consolidation with durvalumab every 2 weeks  BRIEF ONCOLOGIC HISTORY:  Oncology History  Adenocarcinoma of left lung ([place])  12/09/2018 Initial Diagnosis   Adenocarcinoma of left lung ([place])   01/02/2019 Cancer Staging   Staging form: Lung, AJCC 8th Edition - Clinical stage from 01/02/2019: Stage IIIB (cT3, cN2, cM0) - Signed by Derek Jack, MD on 01/02/2019   01/07/2019 - 02/17/2019 Chemotherapy   The patient had palonosetron (ALOXI) injection 0.25 mg, 0.25 mg, Intravenous,  Once, 6 of 6 cycles Administration: 0.25 mg (01/07/2019), 0.25 mg (01/14/2019), 0.25 mg (01/21/2019), 0.25 mg (01/28/2019), 0.25 mg (02/04/2019), 0.25 mg (02/11/2019) CARBOplatin (PARAPLATIN) 270 mg in sodium chloride 0.9 % 250 mL chemo infusion, 270 mg (100 % of original dose 266.4 mg), Intravenous,  Once, 6 of 6 cycles Dose modification:   (original dose 266.4 mg, Cycle 1),   (original dose 266.4 mg, Cycle 2),   (original dose 266.4 mg, Cycle 3),   (original dose 266.4 mg, Cycle 4) Administration: 270 mg (01/07/2019), 270 mg (01/14/2019), 270 mg (01/21/2019), 270 mg (01/28/2019), 270 mg (02/04/2019), 270 mg (02/11/2019) PACLitaxel (TAXOL) 108 mg in sodium chloride 0.9 % 250 mL chemo infusion (</= 80mg /m2), 45 mg/m2 = 108 mg, Intravenous,  Once, 6 of 6 cycles Administration: 108 mg (01/07/2019), 108 mg (01/14/2019), 108 mg (01/21/2019), 108 mg (01/28/2019), 108 mg (02/04/2019), 108 mg (02/11/2019) fosaprepitant (EMEND) 150  mg, dexamethasone (DECADRON) 12 mg in sodium chloride 0.9 % 145 mL IVPB, , Intravenous,  Once, 5 of 5 cycles Administration:  (01/14/2019),  (01/21/2019),  (01/28/2019),  (02/04/2019),  (02/11/2019)  for chemotherapy treatment.    03/19/2019 -  Chemotherapy   The patient had durvalumab (IMFINZI) 1,240 mg in sodium chloride 0.9 % 100 mL chemo infusion, 9.9 mg/kg = 1,260 mg, Intravenous,  Once, 11 of 14 cycles Administration: 1,240 mg (03/19/2019), 1,240 mg (04/02/2019), 1,240 mg (04/16/2019), 1,240 mg (07/24/2019), 1,240 mg (08/07/2019), 1,240 mg (08/21/2019), 1,240 mg (09/04/2019), 1,240 mg (09/19/2019), 1,240 mg (10/02/2019), 1,240 mg (10/17/2019), 1,240 mg (10/31/2019)  for chemotherapy treatment.      CANCER STAGING: Cancer Staging Adenocarcinoma of left lung [facility]) Staging form: Lung, AJCC 8th Edition - Clinical stage from 01/02/2019: Stage IIIB (cT3, cN2, cM0) - Signed by Derek Jack, MD on 01/02/2019   INTERVAL HISTORY:  Denise [name], a 69 y.o. female, returns for routine follow-up and consideration for next cycle of chemotherapy. Denise [name] was last seen on 10/17/2019.  Due for cycle #12 of durvalumab today.   Overall, today she tells me she has been feeling pretty well. Her breathing is better since she is walking slower with a cane. She denies having diarrhea, cough, rashes or itching. Her appetite is good and she is drinking protein shakes daily.  Overall, she feels ready for next cycle of chemo today.    REVIEW OF SYSTEMS:  Review of Systems  Constitutional: Positive for fatigue (severe). Negative for appetite change.  Respiratory: Positive for shortness of breath (stable). Negative for cough.   Gastrointestinal: Negative for diarrhea.  Musculoskeletal: Positive  for arthralgias (R knee pain).  Skin: Negative for itching and rash.  All other systems reviewed and are negative.   PAST MEDICAL/SURGICAL HISTORY:  Past Medical History:  Diagnosis Date   Anemia    Arthritis      Diabetes mellitus    diet controlled   GERD (gastroesophageal reflux disease)    HTN (hypertension)    HTN (hypertension)    Port-A-Cath in place 01/06/2019   Shortness of breath dyspnea    with exertion   Past Surgical History:  Procedure Laterality Date   CHOLECYSTECTOMY  1997   COLONOSCOPY     GASTRIC BYPASS     INCISIONAL HERNIA REPAIR  04/11/11   IR IMAGING GUIDED PORT INSERTION  12/27/2018   LAPAROSCOPIC SALPINGOOPHERECTOMY     LAPAROTOMY  04/11/2011   Procedure: EXPLORATORY LAPAROTOMY;  Surgeon: Joyice Faster. Cornett, MD;  Location: [facility];  Service: General;  Laterality: N/A;  closure port hole   TOTAL HIP ARTHROPLASTY  03/07/2012   Procedure: TOTAL HIP ARTHROPLASTY ANTERIOR APPROACH;  Surgeon: Mauri Pole, MD;  Location: [facility];  Service: Orthopedics;  Laterality: Right;   TOTAL SHOULDER ARTHROPLASTY Left 01/21/2015   TOTAL SHOULDER ARTHROPLASTY Left 01/21/2015   Procedure: LEFT TOTAL SHOULDER ARTHROPLASTY;  Surgeon: Justice Britain, MD;  Location: [facility];  Service: Orthopedics;  Laterality: Left;   VAGINAL HYSTERECTOMY      SOCIAL HISTORY:  Social History   Socioeconomic History   Marital status: Single    Spouse name: Not on file   Number of children: Not on file   Years of education: 12th grade   Highest education level: Not on file  Occupational History   Occupation: Employed    Employer: [employer]  Tobacco Use   Smoking status: Former Smoker    Packs/day: 1.00    Years: 12.00    Pack years: 12.00    Types: Cigarettes    Quit date: 03/20/1976    Years since quitting: 43.6   Smokeless tobacco: Never Used  Vaping Use   Vaping Use: Never used  Substance and Sexual Activity   Alcohol use: No   Drug use: No   Sexual activity: Never  Other Topics Concern   Not on file  Social History Narrative   Not on file   Social Determinants of Health   Financial Resource Strain: Low Risk    Difficulty of Paying Living  Expenses: Not very hard  Food Insecurity: No Food Insecurity   Worried About Charity fundraiser in the Last Year: Never true   [place] in the Last Year: Never true  Transportation Needs: No Transportation Needs   Lack of Transportation (Medical): No   Lack of Transportation (Non-Medical): No  Physical Activity: Inactive   Days of Exercise per Week: 0 days   Minutes of Exercise per Session: 0 min  Stress: No Stress Concern Present   Feeling of Stress : Only a little  Social Connections: Moderately Isolated   Frequency of Communication with Friends and Family: More than three times a week   Frequency of Social Gatherings with Friends and Family: Once a week   Attends Religious Services: More than 4 times per year   Active Member of [employer] or Organizations: No   Attends Archivist Meetings: Never   Marital Status: Never married  Human resources officer Violence: Not At Risk   Fear of Current or Ex-Partner: No   Emotionally Abused: No   Physically Abused: No  Sexually Abused: No    FAMILY HISTORY:  Family History  Problem Relation Age of Onset   Breast cancer Mother    COPD Mother    Arthritis Mother    Diabetes Mother    Hypertension Mother    Hypertension Father    Diabetes Father    Breast cancer Sister    Thyroid cancer Brother    Huntington's disease Maternal Grandmother    Heart attack Brother     CURRENT MEDICATIONS:  Current Outpatient Medications  Medication Sig Dispense Refill   albuterol (VENTOLIN HFA) 108 (90 Base) MCG/ACT inhaler TAKE 2 PUFFS BY MOUTH EVERY 6 HOURS AS NEEDED FOR WHEEZE OR SHORTNESS OF BREATH (Patient not taking: Reported on 10/17/2019) 8 g 6   ALPRAZolam (XANAX) 0.25 MG tablet Take 0.25 mg by mouth as needed.  (Patient not taking: Reported on 10/17/2019)     amLODipine (NORVASC) 5 MG tablet Take 5 mg by mouth every morning.      Calcium Carbonate Antacid (TUMS PO) Take 4 tablets by mouth daily.       Cyanocobalamin (VITAMIN B-12) 2500 MCG SUBL Place 2,500 mcg under the tongue every morning.      diphenhydrAMINE (BENADRYL) 25 MG tablet Take 25 mg by mouth every 6 (six) hours as needed.  (Patient not taking: Reported on 10/17/2019)     DURVALUMAB IV Inject into the vein every 14 (fourteen) days.     ferrous sulfate 325 (65 FE) MG tablet Take 1 tablet (325 mg total) by mouth 3 (three) times daily after meals. (Patient taking differently: Take 325 mg by mouth daily. )     furosemide (LASIX) 20 MG tablet TAKE 1 TABLET (20 MG TOTAL) BY MOUTH AS NEEDED FOR FLUID OR EDEMA (DAILY AS NEEDED FOR SWELLING). 30 tablet 3   gabapentin (NEURONTIN) 300 MG capsule Take 300 mg by mouth at bedtime as needed for pain.      HYDROcodone-acetaminophen (NORCO/VICODIN) 5-325 MG tablet Take 1 tablet by mouth 2 (two) times daily as needed.     ibuprofen (ADVIL,MOTRIN) 800 MG tablet Take 1 tablet (800 mg total) by mouth 3 (three) times daily. 90 tablet 2   Multiple Vitamin (MULITIVITAMIN WITH MINERALS) TABS Take 1 tablet by mouth every morning.      naproxen (NAPROSYN) 500 MG tablet Take 500 mg by mouth as needed.      omeprazole (PRILOSEC) 20 MG capsule Take 20 mg by mouth daily.     No current facility-administered medications for this visit.    ALLERGIES:  No Known Allergies  PHYSICAL EXAM:  Performance status (ECOG): 1 - Symptomatic but completely ambulatory  There were no vitals filed for this visit. Wt Readings from Last 3 Encounters:  10/31/19 282 lb 6.6 oz (128.1 kg)  10/17/19 (!) 286 lb 6.4 oz (129.9 kg)  10/09/19 (!) 289 lb (131.1 kg)   Physical Exam Vitals reviewed.  Constitutional:      Appearance: Normal appearance. She is obese.  Cardiovascular:     Rate and Rhythm: Normal rate and regular rhythm.     Pulses: Normal pulses.     Heart sounds: Normal heart sounds.  Pulmonary:     Effort: Pulmonary effort is normal.     Breath sounds: Normal breath sounds.  Chest:     Comments:  Port-a-Cath in R chest Musculoskeletal:     Right lower leg: Edema (trace) present.     Left lower leg: No edema.  Neurological:     General: No focal  deficit present.     Mental Status: She is alert and oriented to person, place, and time.  Psychiatric:        Mood and Affect: Mood normal.        Behavior: Behavior normal.     LABORATORY DATA:  I have reviewed the labs as listed.  CBC Latest Ref Rng & Units 10/31/2019 10/17/2019 10/02/2019  WBC 4.0 - 10.5 K/uL 5.1 5.3 5.0  Hemoglobin 12.0 - 15.0 g/dL 11.6(L) 11.7(L) 11.8(L)  Hematocrit 36 - 46 % 37.5 38.2 38.6  Platelets 150 - 400 K/uL 305 347 327   CMP Latest Ref Rng & Units 10/31/2019 10/17/2019 10/02/2019  Glucose 70 - 99 mg/dL 102(H) 116(H) 119(H)  BUN 8 - 23 mg/dL 19 16 12   Creatinine 0.44 - 1.00 mg/dL 0.85 0.79 0.71  Sodium 135 - 145 mmol/L 143 140 141  Potassium 3.5 - 5.1 mmol/L 3.8 4.0 4.0  Chloride 98 - 111 mmol/L 108 106 108  CO2 22 - 32 mmol/L 26 25 25   Calcium 8.9 - 10.3 mg/dL 8.8(L) 8.9 8.8(L)  Total Protein 6.5 - 8.1 g/dL 6.3(L) 6.6 6.3(L)  Total Bilirubin 0.3 - 1.2 mg/dL 0.5 0.4 0.4  Alkaline Phos 38 - 126 U/L 84 94 80  AST 15 - 41 U/L 15 20 17   ALT 0 - 44 U/L 20 26 18     DIAGNOSTIC IMAGING:  I have independently reviewed the scans and discussed with the patient. No results found.   ASSESSMENT:  1.Adenocarcinoma of left lung ([facility]) -Chemoradiation therapy with carboplatin and paclitaxel from 01/07/2019 through 02/11/2019. -Consolidation immunotherapy with durvalumab from 03/19/2019 through 04/15/2018, held due to pneumonitis. -CT chest on 07/02/2019 shows left upper lobe lung mass measuring 2.6 x 2.0 cm. It shows improvement in size. -MRI of the brain on 07/18/2019 showed left sphenoid wing meningioma measuring 2.6 x 2.0 x 2.6 cm unchanged. No new enhancing intracranial lesion. Increased left temporal white matter edema. -Durvalumab restarted on 07/24/2019. -We reviewed CT chest with contrast from 09/29/2019  which showed stable appearance of treated lung lesion within the posterior left upper lobe. New bandlike area of increased soft tissue density within the anterior to the treated lung lesion favored to be progressive changes from radiation. Local tumor recurrence is less favored. Stable pericardial effusion.   PLAN:  1. Clinical stage IIIb (T3N2) adenocarcinoma of the lung: -She reported improvement in her breathing when she walks slow. -No worsening of breathing.  No diarrhea.  No skin rashes. -Reviewed labs.  LFTs are normal.  CBC is also normal.  Last TSH was normal. -She will proceed with immunotherapy today and in 2 weeks.  I plan to see her back in 1 month.  2. Shortness of breath on exertion: -This has improved since she started walking slowly.  3. Radiation pneumonitis: -Prednisone tapered off on 07/21/2019.  Breathing improved.  4. Meningioma: -Watchful waiting recommended.  No hemifacial pain.  5.Hypertension: -Norvasc dose was reduced.  6.  Bilateral leg swelling: -Continue Lasix 20 mg daily as needed.  7.  Bilateral knee pains, right more than left: -She had injections in her knees.  Improvement in pain in the left knee noted but not in the right knee.  She follows up with Dr. Para March.   Orders placed this encounter:  No orders of the defined types were placed in this encounter.    Derek Jack, MD [place] [phone]   I, Milinda Antis, am acting as a scribe for Dr. Sanda Linger.  I, Derek Jack MD, have reviewed the above documentation for accuracy and completeness, and I agree with the above.

## 2019-11-13 NOTE — Patient Instructions (Signed)
[facility] Discharge Instructions for Patients Receiving Chemotherapy   Beginning January 23rd 2017 lab work for the Cancer Center will be done in the  Main lab at [facility] on 1st floor. If you have a lab appointment with the Cancer Center please come in thru the  Main Entrance and check in at the main information desk   Today you received the following chemotherapy agents Imfinzi  To help prevent nausea and vomiting after your treatment, we encourage you to take your nausea medication   If you develop nausea and vomiting, or diarrhea that is not controlled by your medication, call the clinic.  The clinic phone number is [phone]. Office hours are Monday-Friday 8:30am-5:00pm.  BELOW ARE SYMPTOMS THAT SHOULD BE REPORTED IMMEDIATELY:  *FEVER GREATER THAN 101.0 F  *CHILLS WITH OR WITHOUT FEVER  NAUSEA AND VOMITING THAT IS NOT CONTROLLED WITH YOUR NAUSEA MEDICATION  *UNUSUAL SHORTNESS OF BREATH  *UNUSUAL BRUISING OR BLEEDING  TENDERNESS IN MOUTH AND THROAT WITH OR WITHOUT PRESENCE OF ULCERS  *URINARY PROBLEMS  *BOWEL PROBLEMS  UNUSUAL RASH Items with * indicate a potential emergency and should be followed up as soon as possible. If you have an emergency after office hours please contact your primary care physician or go to the nearest emergency department.  Please call the clinic during office hours if you have any questions or concerns.   You may also contact the Patient Navigator at [phone] should you have any questions or need assistance in obtaining follow up care.      Resources For Cancer Patients and their Caregivers ? American Cancer Society: Can assist with transportation, wigs, general needs, runs Look Good Feel Better.        1-888-227-6333 ? Cancer Care: Provides financial assistance, online support groups, medication/co-pay assistance.  1-800-813-HOPE (4673) ? [facility] Assists [place] cancer  patients and their families through emotional , educational and financial support.  336-427-4357 ? [place] DSS Where to apply for food stamps, Medicaid and utility assistance. 336-342-1394 ? [facility]: Transportation to medical appointments. 336-347-2287 ? Social Security Administration: May apply for disability if have a Stage IV cancer. 336-342-7796 1-800-772-1213 ? [place] Aging, Disability and Transit Services: Assists with nutrition, care and transit needs. 336-349-2343          

## 2019-11-13 NOTE — Progress Notes (Signed)
Denise [name] presents today for D1C12 Imfinzi . Pt denies any new changes or symptoms since last treatment. Lab results and vitals have been reviewed and are stable and within parameters for treatment. Patient has been assessed by Dr. Delton Coombes who has approved proceeding with treatment today as planned.  Infusions tolerated without incident or complaint. VSS upon completion of treatment. Port flushed and deaccessed per protocol, see MAR and IV flowsheet for details. Discharged in satisfactory condition with follow up instructions.

## 2019-11-13 NOTE — Patient Instructions (Signed)
[place] at [facility] Discharge Instructions  You were seen today by Dr. Delton Coombes. He went over your recent results. You received your treatment today; continue getting your treatment every 2 weeks. Dr. Delton Coombes will see you back in 4 weeks for labs and follow up.   Thank you for choosing [place] at [facility] to provide your oncology and hematology care.  To afford each patient quality time with our provider, please arrive at least 15 minutes before your scheduled appointment time.   If you have a lab appointment with the [place] please come in thru the Main Entrance and check in at the main information desk  You need to re-schedule your appointment should you arrive 10 or more minutes late.  We strive to give you quality time with our providers, and arriving late affects you and other patients whose appointments are after yours.  Also, if you no show three or more times for appointments you may be dismissed from the clinic at the providers discretion.     Again, thank you for choosing [facility].  Our hope is that these requests will decrease the amount of time that you wait before being seen by our physicians.       _____________________________________________________________  Should you have questions after your visit to [facility], please contact our office at [phone] between the hours of 8:00 a.m. and 4:30 p.m.  Voicemails left after 4:00 p.m. will not be returned until the following business day.  For prescription refill requests, have your pharmacy contact our office and allow 72 hours.    Cancer Center Support Programs:   > Cancer Support Group  2nd Tuesday of the month 1pm-2pm, Journey Room

## 2019-11-13 NOTE — Progress Notes (Signed)
Patient was assessed by Dr. Katragadda and labs have been reviewed.  Patient is okay to proceed with treatment today. Primary RN and pharmacy aware.   

## 2019-11-21 DIAGNOSIS — H40053 Ocular hypertension, bilateral: Secondary | ICD-10-CM | POA: Diagnosis not present

## 2019-11-21 DIAGNOSIS — H40003 Preglaucoma, unspecified, bilateral: Secondary | ICD-10-CM | POA: Diagnosis not present

## 2019-11-27 ENCOUNTER — Encounter (HOSPITAL_COMMUNITY): Payer: Self-pay | Admitting: Hematology

## 2019-11-27 ENCOUNTER — Inpatient Hospital Stay (HOSPITAL_BASED_OUTPATIENT_CLINIC_OR_DEPARTMENT_OTHER): Payer: Medicare Other | Admitting: Hematology

## 2019-11-27 ENCOUNTER — Inpatient Hospital Stay (HOSPITAL_COMMUNITY): Payer: Medicare Other | Attending: Hematology

## 2019-11-27 ENCOUNTER — Other Ambulatory Visit: Payer: Self-pay

## 2019-11-27 ENCOUNTER — Inpatient Hospital Stay (HOSPITAL_COMMUNITY): Payer: Medicare Other

## 2019-11-27 ENCOUNTER — Encounter (HOSPITAL_COMMUNITY): Payer: Self-pay

## 2019-11-27 VITALS — BP 123/51 | HR 89 | Temp 97.1°F | Resp 16

## 2019-11-27 DIAGNOSIS — Z5112 Encounter for antineoplastic immunotherapy: Secondary | ICD-10-CM | POA: Diagnosis not present

## 2019-11-27 DIAGNOSIS — J700 Acute pulmonary manifestations due to radiation: Secondary | ICD-10-CM | POA: Insufficient documentation

## 2019-11-27 DIAGNOSIS — D329 Benign neoplasm of meninges, unspecified: Secondary | ICD-10-CM | POA: Insufficient documentation

## 2019-11-27 DIAGNOSIS — Z79899 Other long term (current) drug therapy: Secondary | ICD-10-CM | POA: Diagnosis not present

## 2019-11-27 DIAGNOSIS — I10 Essential (primary) hypertension: Secondary | ICD-10-CM | POA: Diagnosis not present

## 2019-11-27 DIAGNOSIS — C3492 Malignant neoplasm of unspecified part of left bronchus or lung: Secondary | ICD-10-CM

## 2019-11-27 DIAGNOSIS — M549 Dorsalgia, unspecified: Secondary | ICD-10-CM | POA: Diagnosis not present

## 2019-11-27 DIAGNOSIS — Z95828 Presence of other vascular implants and grafts: Secondary | ICD-10-CM

## 2019-11-27 DIAGNOSIS — I313 Pericardial effusion (noninflammatory): Secondary | ICD-10-CM | POA: Insufficient documentation

## 2019-11-27 DIAGNOSIS — C3412 Malignant neoplasm of upper lobe, left bronchus or lung: Secondary | ICD-10-CM | POA: Diagnosis not present

## 2019-11-27 LAB — CBC WITH DIFFERENTIAL/PLATELET
Abs Immature Granulocytes: 0.01 10*3/uL (ref 0.00–0.07)
Basophils Absolute: 0 10*3/uL (ref 0.0–0.1)
Basophils Relative: 1 %
Eosinophils Absolute: 0.1 10*3/uL (ref 0.0–0.5)
Eosinophils Relative: 2 %
HCT: 37.7 % (ref 36.0–46.0)
Hemoglobin: 11.7 g/dL — ABNORMAL LOW (ref 12.0–15.0)
Immature Granulocytes: 0 %
Lymphocytes Relative: 13 %
Lymphs Abs: 0.6 10*3/uL — ABNORMAL LOW (ref 0.7–4.0)
MCH: 27.1 pg (ref 26.0–34.0)
MCHC: 31 g/dL (ref 30.0–36.0)
MCV: 87.5 fL (ref 80.0–100.0)
Monocytes Absolute: 0.3 10*3/uL (ref 0.1–1.0)
Monocytes Relative: 7 %
Neutro Abs: 3.6 10*3/uL (ref 1.7–7.7)
Neutrophils Relative %: 77 %
Platelets: 339 10*3/uL (ref 150–400)
RBC: 4.31 MIL/uL (ref 3.87–5.11)
RDW: 15.3 % (ref 11.5–15.5)
WBC: 4.6 10*3/uL (ref 4.0–10.5)
nRBC: 0 % (ref 0.0–0.2)

## 2019-11-27 LAB — COMPREHENSIVE METABOLIC PANEL
ALT: 21 U/L (ref 0–44)
AST: 19 U/L (ref 15–41)
Albumin: 3.5 g/dL (ref 3.5–5.0)
Alkaline Phosphatase: 77 U/L (ref 38–126)
Anion gap: 10 (ref 5–15)
BUN: 14 mg/dL (ref 8–23)
CO2: 24 mmol/L (ref 22–32)
Calcium: 9 mg/dL (ref 8.9–10.3)
Chloride: 108 mmol/L (ref 98–111)
Creatinine, Ser: 0.78 mg/dL (ref 0.44–1.00)
GFR calc Af Amer: >60 mL/min (ref >60)
GFR calc non Af Amer: >60 mL/min (ref >60)
Glucose, Bld: 123 mg/dL — ABNORMAL HIGH (ref 70–99)
Potassium: 3.6 mmol/L (ref 3.5–5.1)
Sodium: 142 mmol/L (ref 135–145)
Total Bilirubin: 0.5 mg/dL (ref 0.3–1.2)
Total Protein: 6.4 g/dL — ABNORMAL LOW (ref 6.5–8.1)

## 2019-11-27 LAB — TSH: TSH: 1.435 u[IU]/mL (ref 0.350–4.500)

## 2019-11-27 MED ORDER — SODIUM CHLORIDE 0.9 % IV SOLN
10.0000 mg/kg | Freq: Once | INTRAVENOUS | Status: AC
  Administered 2019-11-27: 1240 mg via INTRAVENOUS
  Filled 2019-11-27: qty 20

## 2019-11-27 MED ORDER — SODIUM CHLORIDE 0.9 % IV SOLN: Freq: Once | INTRAVENOUS | Status: AC

## 2019-11-27 MED ORDER — HEPARIN SOD (PORK) LOCK FLUSH 100 UNIT/ML IV SOLN
500.0000 [IU] | Freq: Once | INTRAVENOUS | Status: AC | PRN
  Administered 2019-11-27: 500 [IU]

## 2019-11-27 MED ORDER — SODIUM CHLORIDE 0.9% FLUSH
10.0000 mL | INTRAVENOUS | Status: DC | PRN
  Administered 2019-11-27: 10 mL

## 2019-11-27 NOTE — Patient Instructions (Signed)
[facility] Discharge Instructions for Patients Receiving Chemotherapy  Today you received the following chemotherapy agents   To help prevent nausea and vomiting after your treatment, we encourage you to take your nausea medication   If you develop nausea and vomiting that is not controlled by your nausea medication, call the clinic.   BELOW ARE SYMPTOMS THAT SHOULD BE REPORTED IMMEDIATELY:  *FEVER GREATER THAN 100.5 F  *CHILLS WITH OR WITHOUT FEVER  NAUSEA AND VOMITING THAT IS NOT CONTROLLED WITH YOUR NAUSEA MEDICATION  *UNUSUAL SHORTNESS OF BREATH  *UNUSUAL BRUISING OR BLEEDING  TENDERNESS IN MOUTH AND THROAT WITH OR WITHOUT PRESENCE OF ULCERS  *URINARY PROBLEMS  *BOWEL PROBLEMS  UNUSUAL RASH Items with * indicate a potential emergency and should be followed up as soon as possible.  Feel free to call the clinic should you have any questions or concerns. The clinic phone number is [phone].  Please show the CHEMO ALERT CARD at check-in to the Emergency Department and triage nurse.   

## 2019-11-27 NOTE — Patient Instructions (Signed)
[place] at [facility] Discharge Instructions  You were seen today by Dr. Delton Coombes. He went over your recent results. You received your treatment today. You will be scheduled for a CT scan of your chest and an MRI of your back. Purchase GasX over the counter and take as needed for your indigestion. Dr. Delton Coombes will see you back in 2 weeks for labs and follow up.   Thank you for choosing [place] at [facility] to provide your oncology and hematology care.  To afford each patient quality time with our provider, please arrive at least 15 minutes before your scheduled appointment time.   If you have a lab appointment with the [place] please come in thru the Main Entrance and check in at the main information desk  You need to re-schedule your appointment should you arrive 10 or more minutes late.  We strive to give you quality time with our providers, and arriving late affects you and other patients whose appointments are after yours.  Also, if you no show three or more times for appointments you may be dismissed from the clinic at the providers discretion.     Again, thank you for choosing [facility]  Our hope is that these requests will decrease the amount of time that you wait before being seen by our physicians.       _____________________________________________________________  Should you have questions after your visit to [facility], please contact our office at [phone] between the hours of 8:00 a.m. and 4:30 p.m.  Voicemails left after 4:00 p.m. will not be returned until the following business day.  For prescription refill requests, have your pharmacy contact our office and allow 72 hours.    Cancer Center Support Programs:   > Cancer Support Group  2nd Tuesday of the month 1pm-2pm, Journey Room

## 2019-11-27 NOTE — Progress Notes (Signed)
Denise [name], [place] [address]   CLINIC:  Medical Oncology/Hematology  PCP:  Iona Beard, [address] / [place] [address] [phone]   REASON FOR VISIT:  Follow-up for stage III left lung adenocarcinoma  PRIOR THERAPY:  1. Chemoradiation with carboplatin and paclitaxel from 01/07/2019 to 02/11/2019. 2. Consolidation with durvalumab from 03/19/2019 to 04/16/2019, held due to pneumonitis.  NGS Results: Not done  CURRENT THERAPY: Consolidation with durvalumab every 2 weeks  BRIEF ONCOLOGIC HISTORY:  Oncology History  Adenocarcinoma of left lung ([place])  12/09/2018 Initial Diagnosis   Adenocarcinoma of left lung ([place])   01/02/2019 Cancer Staging   Staging form: Lung, AJCC 8th Edition - Clinical stage from 01/02/2019: Stage IIIB (cT3, cN2, cM0) - Signed by Derek Jack, MD on 01/02/2019   01/07/2019 - 02/17/2019 Chemotherapy   The patient had palonosetron (ALOXI) injection 0.25 mg, 0.25 mg, Intravenous,  Once, 6 of 6 cycles Administration: 0.25 mg (01/07/2019), 0.25 mg (01/14/2019), 0.25 mg (01/21/2019), 0.25 mg (01/28/2019), 0.25 mg (02/04/2019), 0.25 mg (02/11/2019) CARBOplatin (PARAPLATIN) 270 mg in sodium chloride 0.9 % 250 mL chemo infusion, 270 mg (100 % of original dose 266.4 mg), Intravenous,  Once, 6 of 6 cycles Dose modification:   (original dose 266.4 mg, Cycle 1),   (original dose 266.4 mg, Cycle 2),   (original dose 266.4 mg, Cycle 3),   (original dose 266.4 mg, Cycle 4) Administration: 270 mg (01/07/2019), 270 mg (01/14/2019), 270 mg (01/21/2019), 270 mg (01/28/2019), 270 mg (02/04/2019), 270 mg (02/11/2019) PACLitaxel (TAXOL) 108 mg in sodium chloride 0.9 % 250 mL chemo infusion (</= 80mg /m2), 45 mg/m2 = 108 mg, Intravenous,  Once, 6 of 6 cycles Administration: 108 mg (01/07/2019), 108 mg (01/14/2019), 108 mg (01/21/2019), 108 mg (01/28/2019), 108 mg (02/04/2019), 108 mg (02/11/2019) fosaprepitant (EMEND) 150  mg, dexamethasone (DECADRON) 12 mg in sodium chloride 0.9 % 145 mL IVPB, , Intravenous,  Once, 5 of 5 cycles Administration:  (01/14/2019),  (01/21/2019),  (01/28/2019),  (02/04/2019),  (02/11/2019)  for chemotherapy treatment.    03/19/2019 -  Chemotherapy   The patient had durvalumab (IMFINZI) 1,240 mg in sodium chloride 0.9 % 100 mL chemo infusion, 9.9 mg/kg = 1,260 mg, Intravenous,  Once, 12 of 14 cycles Administration: 1,240 mg (03/19/2019), 1,240 mg (04/02/2019), 1,240 mg (04/16/2019), 1,240 mg (07/24/2019), 1,240 mg (08/07/2019), 1,240 mg (08/21/2019), 1,240 mg (09/04/2019), 1,240 mg (09/19/2019), 1,240 mg (10/02/2019), 1,240 mg (10/17/2019), 1,240 mg (10/31/2019), 1,240 mg (11/13/2019)  for chemotherapy treatment.      CANCER STAGING: Cancer Staging Adenocarcinoma of left lung [facility]) Staging form: Lung, AJCC 8th Edition - Clinical stage from 01/02/2019: Stage IIIB (cT3, cN2, cM0) - Signed by Derek Jack, MD on 01/02/2019   INTERVAL HISTORY:  [name], a 69 y.o. female, returns for routine follow-up and consideration for next cycle of chemotherapy. Denise [name] was last seen on 11/13/2019.  Due for cycle #13 of durvalumab today.   Today she complains of worsening low back pain when walking,cough, and indigestion with burping before she received her chemo, worsening last week. The back pain is dull and heavy and worse when she walks but does not radiate down her legs; sitting down relieves the pain in a few minutes. She is coughing up clear sputum. She denies having diarrhea or rash, though she gets dry skin and has a BM every morning. She is burping loudly again, similar to what she was experiencing in March 2020; the burping improved  when she had chemoradiation. She is taking omeprazole daily. She denies having CP with her indigestion. Her SOB is exacerbated when she walks briskly but is tolerable when she slows down. Her leg swelling is stable and she takes Lasix a couple times per  week.  Overall, she feels ready for next cycle of chemo today.    REVIEW OF SYSTEMS:  Review of Systems  Constitutional: Positive for appetite change and fatigue.  Respiratory: Positive for cough (w/ clear sputum) and shortness of breath (w/ exertion).   Cardiovascular: Positive for leg swelling (stable). Negative for chest pain.  Gastrointestinal: Negative for diarrhea.  Musculoskeletal: Positive for back pain (lower back pain).  Skin: Negative for rash.  All other systems reviewed and are negative.   PAST MEDICAL/SURGICAL HISTORY:  Past Medical History:  Diagnosis Date  . Anemia   . Arthritis   . Diabetes mellitus    diet controlled  . GERD (gastroesophageal reflux disease)   . HTN (hypertension)   . HTN (hypertension)   . Port-A-Cath in place 01/06/2019  . Shortness of breath dyspnea    with exertion   Past Surgical History:  Procedure Laterality Date  . CHOLECYSTECTOMY  1997  . COLONOSCOPY    . GASTRIC BYPASS    . INCISIONAL HERNIA REPAIR  04/11/11  . IR IMAGING GUIDED PORT INSERTION  12/27/2018   Right  . LAPAROSCOPIC SALPINGOOPHERECTOMY    . LAPAROTOMY  04/11/2011   Procedure: EXPLORATORY LAPAROTOMY;  Surgeon: Joyice Faster. Cornett, MD;  Location: [facility];  Service: General;  Laterality: N/A;  closure port hole  . TOTAL HIP ARTHROPLASTY  03/07/2012   Procedure: TOTAL HIP ARTHROPLASTY ANTERIOR APPROACH;  Surgeon: Mauri Pole, MD;  Location: [facility];  Service: Orthopedics;  Laterality: Right;  . TOTAL SHOULDER ARTHROPLASTY Left 01/21/2015  . TOTAL SHOULDER ARTHROPLASTY Left 01/21/2015   Procedure: LEFT TOTAL SHOULDER ARTHROPLASTY;  Surgeon: Justice Britain, MD;  Location: [facility];  Service: Orthopedics;  Laterality: Left;  Marland Kitchen VAGINAL HYSTERECTOMY      SOCIAL HISTORY:  Social History   Socioeconomic History  . Marital status: Single    Spouse name: Not on file  . Number of children: Not on file  . Years of education: 12th grade  . Highest education level: Not on file   Occupational History  . Occupation: Employed    Employer: [employer]  Tobacco Use  . Smoking status: Former Smoker    Packs/day: 1.00    Years: 12.00    Pack years: 12.00    Types: Cigarettes    Quit date: 03/20/1976    Years since quitting: 43.7  . Smokeless tobacco: Never Used  Vaping Use  . Vaping Use: Never used  Substance and Sexual Activity  . Alcohol use: No  . Drug use: No  . Sexual activity: Never  Other Topics Concern  . Not on file  Social History Narrative  . Not on file   Social Determinants of Health   Financial Resource Strain: Low Risk   . Difficulty of Paying Living Expenses: Not very hard  Food Insecurity: No Food Insecurity  . Worried About Charity fundraiser in the Last Year: Never true  . Ran Out of Food in the Last Year: Never true  Transportation Needs: No Transportation Needs  . Lack of Transportation (Medical): No  . Lack of Transportation (Non-Medical): No  Physical Activity: Inactive  . Days of Exercise per Week: 0 days  . Minutes of Exercise per Session: 0  min  Stress: No Stress Concern Present  . Feeling of Stress : Only a little  Social Connections: Moderately Isolated  . Frequency of Communication with Friends and Family: More than three times a week  . Frequency of Social Gatherings with Friends and Family: Once a week  . Attends Religious Services: More than 4 times per year  . Active Member of Clubs or Organizations: No  . Attends Archivist Meetings: Never  . Marital Status: Never married  Intimate Partner Violence: Not At Risk  . Fear of Current or Ex-Partner: No  . Emotionally Abused: No  . Physically Abused: No  . Sexually Abused: No    FAMILY HISTORY:  Family History  Problem Relation Age of Onset  . Breast cancer Mother   . COPD Mother   . Arthritis Mother   . Diabetes Mother   . Hypertension Mother   . Hypertension Father   . Diabetes Father   . Breast cancer Sister   . Thyroid cancer  Brother   . Huntington's disease Maternal Grandmother   . Heart attack Brother     CURRENT MEDICATIONS:  Current Outpatient Medications  Medication Sig Dispense Refill  . amLODipine (NORVASC) 5 MG tablet Take 5 mg by mouth every morning.     . Calcium Carbonate Antacid (TUMS PO) Take 4 tablets by mouth daily.     . Cyanocobalamin (VITAMIN B-12) 2500 MCG SUBL Place 2,500 mcg under the tongue every morning.     . diphenhydrAMINE (BENADRYL) 25 MG tablet Take 25 mg by mouth every 6 (six) hours as needed.     Hunt Oris IV Inject into the vein every 14 (fourteen) days.    . ferrous sulfate 325 (65 FE) MG tablet Take 1 tablet (325 mg total) by mouth 3 (three) times daily after meals. (Patient taking differently: Take 325 mg by mouth daily. )    . furosemide (LASIX) 20 MG tablet TAKE 1 TABLET (20 MG TOTAL) BY MOUTH AS NEEDED FOR FLUID OR EDEMA (DAILY AS NEEDED FOR SWELLING). 30 tablet 3  . gabapentin (NEURONTIN) 300 MG capsule Take 300 mg by mouth at bedtime as needed for pain.     Marland Kitchen HYDROcodone-acetaminophen (NORCO/VICODIN) 5-325 MG tablet Take 1 tablet by mouth 2 (two) times daily as needed.     Marland Kitchen ibuprofen (ADVIL,MOTRIN) 800 MG tablet Take 1 tablet (800 mg total) by mouth 3 (three) times daily. 90 tablet 2  . Multiple Vitamin (MULITIVITAMIN WITH MINERALS) TABS Take 1 tablet by mouth every morning.     . naproxen (NAPROSYN) 500 MG tablet Take 500 mg by mouth as needed.     Marland Kitchen omeprazole (PRILOSEC) 20 MG capsule Take 20 mg by mouth daily.     No current facility-administered medications for this visit.    ALLERGIES:  No Known Allergies  PHYSICAL EXAM:  Performance status (ECOG): 1 - Symptomatic but completely ambulatory  There were no vitals filed for this visit. Wt Readings from Last 3 Encounters:  11/27/19 285 lb 12.8 oz (129.6 kg)  11/13/19 282 lb 9.6 oz (128.2 kg)  10/31/19 282 lb 6.6 oz (128.1 kg)   Physical Exam Vitals reviewed.  Constitutional:      Appearance: Normal  appearance. She is obese.  Cardiovascular:     Rate and Rhythm: Normal rate and regular rhythm.     Pulses: Normal pulses.     Heart sounds: Normal heart sounds.  Pulmonary:     Effort: Pulmonary effort is normal.  Breath sounds: Normal breath sounds.  Chest:     Comments: Port-a-Cath in R chest Musculoskeletal:     Right lower leg: Edema (trace) present.     Left lower leg: Edema (trace) present.  Neurological:     General: No focal deficit present.     Mental Status: She is alert and oriented to person, place, and time.  Psychiatric:        Mood and Affect: Mood normal.        Behavior: Behavior normal.     LABORATORY DATA:  I have reviewed the labs as listed.  CBC Latest Ref Rng & Units 11/27/2019 11/13/2019 10/31/2019  WBC 4.0 - 10.5 K/uL 4.6 4.7 5.1  Hemoglobin 12.0 - 15.0 g/dL 11.7(L) 11.6(L) 11.6(L)  Hematocrit 36 - 46 % 37.7 37.4 37.5  Platelets 150 - 400 K/uL 339 312 305   CMP Latest Ref Rng & Units 11/27/2019 11/13/2019 10/31/2019  Glucose 70 - 99 mg/dL 123(H) 108(H) 102(H)  BUN 8 - 23 mg/dL 14 15 19   Creatinine 0.44 - 1.00 mg/dL 0.78 0.71 0.85  Sodium 135 - 145 mmol/L 142 141 143  Potassium 3.5 - 5.1 mmol/L 3.6 3.8 3.8  Chloride 98 - 111 mmol/L 108 109 108  CO2 22 - 32 mmol/L 24 25 26   Calcium 8.9 - 10.3 mg/dL 9.0 8.7(L) 8.8(L)  Total Protein 6.5 - 8.1 g/dL 6.4(L) 6.3(L) 6.3(L)  Total Bilirubin 0.3 - 1.2 mg/dL 0.5 0.4 0.5  Alkaline Phos 38 - 126 U/L 77 79 84  AST 15 - 41 U/L 19 16 15   ALT 0 - 44 U/L 21 21 20     DIAGNOSTIC IMAGING:  I have independently reviewed the scans and discussed with the patient. No results found.   ASSESSMENT:  1.Adenocarcinoma of left lung ([facility]) -Chemoradiation therapy with carboplatin and paclitaxel from 01/07/2019 through 02/11/2019. -Consolidation immunotherapy with durvalumab from 03/19/2019 through 04/15/2018, held due to pneumonitis. -CT chest on 07/02/2019 shows left upper lobe lung mass measuring 2.6 x 2.0 cm. It shows  improvement in size. -MRI of the brain on 07/18/2019 showed left sphenoid wing meningioma measuring 2.6 x 2.0 x 2.6 cm unchanged. No new enhancing intracranial lesion. Increased left temporal white matter edema. -Durvalumab restarted on 07/24/2019. -We reviewed CT chest with contrast from 09/29/2019 which showed stable appearance of treated lung lesion within the posterior left upper lobe. New bandlike area of increased soft tissue density within the anterior to the treated lung lesion favored to be progressive changes from radiation. Local tumor recurrence is less favored. Stable pericardial effusion.   PLAN:  1. Clinical stage IIIb (T3N2) adenocarcinoma of the lung: -She complained of back pain with walking few steps.  It is in the lower back.  It started last week.  She is very concerned as she had similar pain at the time of diagnosis. -She also complained of cough and clear mucus expectoration in the last few days.  Also developed belching/indigestion after eating.  She is taking Protonix. -Recommended taking Gas-X for belching.  For the low back pain, will order MRI of the lumbar spine. -We will also order CT scan of the chest with contrast prior to next visit in 2 weeks.  She will proceed with her immunotherapy today.  2. Shortness of breath on exertion: -This has improved when she started walking slowly.  3. Radiation pneumonitis: -Prednisone tapered off on 07/21/2019.  4. Meningioma: -Watchful waiting recommended.  No hemifacial pain.  5.Hypertension: -Norvasc dose was reduced.  6.Bilateral leg  swelling: -She is taking Lasix twice weekly.  7. Bilateral knee pains, right more than left: -She had bilateral knee injections by Dr. Para March.  Left knee has improved but right knee did not.   Orders placed this encounter:  Orders Placed This Encounter  Procedures  . MR Lumbar Spine W Wo Contrast  . CT Chest W Contrast     Derek Jack, MD [place] [phone]   I, Milinda Antis, am acting as a scribe for Dr. Sanda Linger.  I, Derek Jack MD, have reviewed the above documentation for accuracy and completeness, and I agree with the above.

## 2019-11-27 NOTE — Progress Notes (Signed)
Patient presents today for treatment. Labs pending. Vital signs are within parameters for treatment. Patient has complaints of back pain today that she says is new like before she started treatment. Patient requests for MD to be made aware of the back pain, cough, shortness of breath. Patient states I feel like I did when I first started chemotherapy. Patient has complaints of indigestion.  Patient states there has been a change in her symptoms and scans will not be performed until October. Concerns addressed and Dr. Delton Coombes notified verbally.   Labs within parameters for treatment.    Patient to be seen by Dr. Delton Coombes today. Appointment scheduled.   Message received from [place] RN/ Dr. Delton Coombes. Proceed with treatment today.   Treatment given today per MD orders. Tolerated infusion without adverse affects. Vital signs stable. No complaints at this time. Discharged from clinic ambulatory. F/U with [facility] as scheduled.

## 2019-11-27 NOTE — Progress Notes (Signed)
Patient was assessed by Dr. Delton Coombes and labs have been reviewed.  Patient will be scheduled for MR lumbar spine and CT chest and then follow up with physician.  Patient is okay to proceed with treatment today. Primary RN and pharmacy aware.

## 2019-11-27 NOTE — Patient Instructions (Signed)
[facility] at [facility] Discharge Instructions  Labs drawn from portacath today   Thank you for choosing [facility] at [facility] to provide your oncology and hematology care.  To afford each patient quality time with our provider, please arrive at least 15 minutes before your scheduled appointment time.   If you have a lab appointment with the Cancer Center please come in thru the Main Entrance and check in at the main information desk.  You need to re-schedule your appointment should you arrive 10 or more minutes late.  We strive to give you quality time with our providers, and arriving late affects you and other patients whose appointments are after yours.  Also, if you no show three or more times for appointments you may be dismissed from the clinic at the providers discretion.     Again, thank you for choosing [facility].  Our hope is that these requests will decrease the amount of time that you wait before being seen by our physicians.       _____________________________________________________________  Should you have questions after your visit to [facility], please contact our office at [phone] and follow the prompts.  Our office hours are 8:00 a.m. and 4:30 p.m. Monday - Friday.  Please note that voicemails left after 4:00 p.m. may not be returned until the following business day.  We are closed weekends and major holidays.  You do have access to a nurse 24-7, just call the main number to the clinic [phone] and do not press any options, hold on the line and a nurse will answer the phone.    For prescription refill requests, have your pharmacy contact our office and allow 72 hours.    Due to Covid, you will need to wear a mask upon entering the hospital. If you do not have a mask, a mask will be given to you at the Main Entrance upon arrival. For doctor visits, patients may have 1 support person age 18  or older with them. For treatment visits, patients can not have anyone with them due to social distancing guidelines and our immunocompromised population.     

## 2019-12-03 ENCOUNTER — Encounter: Payer: Self-pay | Admitting: Cardiology

## 2019-12-03 NOTE — Progress Notes (Signed)
Cardiology Office Note  Date: 12/04/2019   ID: Makenna Macaluso, DOB 12/07/1950, MRN [id number]  PCP:  Iona Beard, MD  Cardiologist:  Rozann Lesches, MD Electrophysiologist:  None   Chief Complaint  Patient presents with  . Shortness of Breath    History of Present Illness: Denise [name] is a 69 y.o. female referred for cardiology consultation by Dr. Melvyn Novas for evaluation of multifactorial dyspnea on exertion with concern for contribution from cardiac components.  I reviewed her records and updated the chart.   She has a history of stage III left lung adenocarcinoma status post chemoradiation and previous concerns for pneumonitis treated with steroids.  Echocardiography from February of this year revealed LVEF 60 to 65% with mild diastolic dysfunction, normal RV contraction and estimated PASP, also moderate aortic stenosis with mean gradient 21 mmHg.  Chest CT in July mentioned multivessel coronary artery calcification in addition to aortic atherosclerosis.  She tells me that she experiences dyspnea on exertion, NYHA class II-III per description, no chest tightness, palpitations, or syncope.  No definite wheezing. She does have some cough with phlegm in the mornings, but not during the daytime.  The symptoms have been occurring since radiation in December of last year by report.  I personally reviewed her ECG today which shows sinus rhythm with incomplete right bundle branch block and left anterior fascicular block, also increased voltage.  She is limited by chronic bilateral arthritic knee pain, trying to lose some weight as well.  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   Past Medical History:  Diagnosis Date  . Anemia   . Aortic stenosis   . Arthritis   . Coronary artery calcification seen on CT scan   . Essential hypertension   . GERD (gastroesophageal reflux disease)   . History of lung cancer    Stage III adenocarcinoma status post chemoradiation  .  Port-A-Cath in place 01/06/2019  . Type 2 diabetes mellitus ([place])     Past Surgical History:  Procedure Laterality Date  . CHOLECYSTECTOMY  1997  . COLONOSCOPY    . GASTRIC BYPASS    . INCISIONAL HERNIA REPAIR  04/11/11  . IR IMAGING GUIDED PORT INSERTION  12/27/2018   Right  . LAPAROSCOPIC SALPINGOOPHERECTOMY    . LAPAROTOMY  04/11/2011   Procedure: EXPLORATORY LAPAROTOMY;  Surgeon: Joyice Faster. Cornett, MD;  Location: [facility];  Service: General;  Laterality: N/A;  closure port hole  . TOTAL HIP ARTHROPLASTY  03/07/2012   Procedure: TOTAL HIP ARTHROPLASTY ANTERIOR APPROACH;  Surgeon: Mauri Pole, MD;  Location: [facility];  Service: Orthopedics;  Laterality: Right;  . TOTAL SHOULDER ARTHROPLASTY Left 01/21/2015  . TOTAL SHOULDER ARTHROPLASTY Left 01/21/2015   Procedure: LEFT TOTAL SHOULDER ARTHROPLASTY;  Surgeon: Justice Britain, MD;  Location: [facility];  Service: Orthopedics;  Laterality: Left;  Marland Kitchen VAGINAL HYSTERECTOMY      Current Outpatient Medications  Medication Sig Dispense Refill  . amLODipine (NORVASC) 5 MG tablet Take 5 mg by mouth every morning.     Marland Kitchen aspirin EC 81 MG tablet Take 81 mg by mouth daily. Swallow whole.    . Calcium Carbonate Antacid (TUMS PO) Take 4 tablets by mouth daily.     . Cyanocobalamin (VITAMIN B-12) 2500 MCG SUBL Place 2,500 mcg under the tongue every morning.     . diphenhydrAMINE (BENADRYL) 25 MG tablet Take 25 mg by mouth every 6 (six) hours as needed.     Hunt Oris IV Inject into  the vein every 14 (fourteen) days.    . ferrous sulfate 325 (65 FE) MG tablet Take 1 tablet (325 mg total) by mouth 3 (three) times daily after meals. (Patient taking differently: Take 325 mg by mouth daily. )    . furosemide (LASIX) 20 MG tablet TAKE 1 TABLET (20 MG TOTAL) BY MOUTH AS NEEDED FOR FLUID OR EDEMA (DAILY AS NEEDED FOR SWELLING). 30 tablet 3  . gabapentin (NEURONTIN) 300 MG capsule Take 300 mg by mouth at bedtime as needed for pain.     Marland Kitchen HYDROcodone-acetaminophen  (NORCO/VICODIN) 5-325 MG tablet Take 1 tablet by mouth 2 (two) times daily as needed.     Marland Kitchen ibuprofen (ADVIL,MOTRIN) 800 MG tablet Take 1 tablet (800 mg total) by mouth 3 (three) times daily. 90 tablet 2  . Multiple Vitamin (MULITIVITAMIN WITH MINERALS) TABS Take 1 tablet by mouth every morning.     . naproxen (NAPROSYN) 500 MG tablet Take 500 mg by mouth as needed.     Marland Kitchen omeprazole (PRILOSEC) 20 MG capsule Take 20 mg by mouth daily.     No current facility-administered medications for this visit.   Allergies:  Patient has no known allergies.   Social History: The patient  reports that she quit smoking about 43 years ago. Her smoking use included cigarettes. She has a 12.00 pack-year smoking history. She has never used smokeless tobacco. She reports that she does not drink alcohol and does not use drugs.   Family History: The patient's family history includes Arthritis in her mother; Breast cancer in her mother and sister; COPD in her mother; Diabetes in her father and mother; Heart attack in her brother; Huntington's disease in her maternal grandmother; Hypertension in her father and mother; Thyroid cancer in her brother.   ROS:   No orthopnea or PND.  Chronic leg swelling.  Physical Exam: VS:  BP (!) 144/82   Pulse 86   Ht 5\' 2"  (1.575 m)   Wt 286 lb (129.7 kg)   SpO2 94%   BMI 52.31 kg/m , BMI Body mass index is 52.31 kg/m.  Wt Readings from Last 3 Encounters:  12/04/19 286 lb (129.7 kg)  11/27/19 285 lb 12.8 oz (129.6 kg)  11/13/19 282 lb 9.6 oz (128.2 kg)    General: Patient appears comfortable at rest. HEENT: Conjunctiva and lids normal, wearing a mask. Neck: Supple, no elevated JVP or carotid bruits, no thyromegaly. Lungs: Decreased breath sounds without wheezing or rhonchi. Cardiac: Regular rate and rhythm, no S3, 3/6 systolic murmur consistent with aortic stenosis, no pericardial rub. Abdomen: Soft, nontender, bowel sounds present. Extremities: 1-2+ lower leg  edema. Skin: Warm and dry. Musculoskeletal: No kyphosis. Neuropsychiatric: Alert and oriented x3, affect grossly appropriate.  ECG:  An ECG dated 01/14/2015 was personally reviewed today and demonstrated:  Sinus rhythm with PAC, left anterior fascicular block, LVH.  Recent Labwork: 05/01/2019: B Natriuretic Peptide 43.0 05/29/2019: Magnesium 2.1 11/27/2019: ALT 21; AST 19; BUN 14; Creatinine, Ser 0.78; Hemoglobin 11.7; Platelets 339; Potassium 3.6; Sodium 142; TSH 1.435   Other Studies Reviewed Today:  Echocardiogram 05/15/2019: 1. Left ventricular ejection fraction, by estimation, is 60 to 65%. The  left ventricle has normal function. The left ventricle has no regional  wall motion abnormalities. There is moderate left ventricular hypertrophy.  Left ventricular diastolic  parameters are consistent with Grade I diastolic dysfunction (impaired  relaxation). Elevated left atrial pressure.  2. Right ventricular systolic function is normal. The right ventricular  size is normal.  There is normal pulmonary artery systolic pressure.  3. Left atrial size was moderately dilated.  4. The mitral valve is normal in structure and function. No evidence of  mitral valve regurgitation. No evidence of mitral stenosis.  5. The aortic valve is tricuspid. Aortic valve regurgitation is mild.  Moderate aortic valve stenosis.Aortic valve mean gradient measures 21.0  mmHg. Aortic valve peak gradient measures 36.2 mmHg. Aortic valve area, by  VTI measures 1.27 cm  6. The inferior vena cava is normal in size with greater than 50%  respiratory variability, suggesting right atrial pressure of 3 mmHg.  7. Cannot exclude small PFO with left to right shunt.   Assessment and Plan:  1.  Dyspnea on exertion, likely multifactorial in the setting of stage III left lung adenocarcinoma status post chemotherapy and radiation with prior concerns for pneumonitis, obesity, moderate calcific aortic stenosis, and mild  diastolic dysfunction.  Based on her echocardiogram from February, it is not entirely clear that the degree of aortic stenosis would necessarily be causing symptoms, although this needs to be followed closely going forward.  She has coronary artery calcification evident by CT imaging, although this does not necessarily equate to obstructive stenoses.  We discussed plan for basic ischemic evaluation and will proceed with Lexiscan Myoview at this point, 2-day protocol most likely.  If she does have significant ischemia, I have talked with her about cardiac catheterization as a next step.  Also obtain baseline lipids as she would likely benefit from statin therapy.  She is taking an aspirin 81 mg daily.  2.  Type 2 diabetes mellitus followed by Dr. Berdine Addison.  Not certain about overall control over time.  I did see she had a hemoglobin A1c of 6.5% in August of last year.  3.  Obesity, we did discuss weight loss and diet.  4.  Essential hypertension, on Norvasc.  Medication Adjustments/Labs and Tests Ordered: Current medicines are reviewed at length with the patient today.  Concerns regarding medicines are outlined above.   Tests Ordered: Orders Placed This Encounter  Procedures  . NM Myocar Multi W/Spect W/[place] / EF  . Lipid panel  . EKG 12-Lead    Medication Changes: No orders of the defined types were placed in this encounter.   Disposition:  Follow up test results.  Signed, Satira Sark, MD, [facility] 12/04/2019 9:38 AM    [facility] [address], [place], [place] [address] Phone: [phone]; Fax: [phone]

## 2019-12-04 ENCOUNTER — Other Ambulatory Visit: Payer: Self-pay

## 2019-12-04 ENCOUNTER — Telehealth: Payer: Self-pay | Admitting: Cardiology

## 2019-12-04 ENCOUNTER — Ambulatory Visit (INDEPENDENT_AMBULATORY_CARE_PROVIDER_SITE_OTHER): Payer: Medicare Other | Admitting: Cardiology

## 2019-12-04 ENCOUNTER — Encounter: Payer: Self-pay | Admitting: Cardiology

## 2019-12-04 ENCOUNTER — Encounter: Payer: Self-pay | Admitting: *Deleted

## 2019-12-04 VITALS — BP 144/82 | HR 86 | Ht 62.0 in | Wt 286.0 lb

## 2019-12-04 DIAGNOSIS — I2510 Atherosclerotic heart disease of native coronary artery without angina pectoris: Secondary | ICD-10-CM | POA: Diagnosis not present

## 2019-12-04 DIAGNOSIS — I350 Nonrheumatic aortic (valve) stenosis: Secondary | ICD-10-CM

## 2019-12-04 DIAGNOSIS — R0600 Dyspnea, unspecified: Secondary | ICD-10-CM

## 2019-12-04 DIAGNOSIS — Z13220 Encounter for screening for lipoid disorders: Secondary | ICD-10-CM | POA: Diagnosis not present

## 2019-12-04 DIAGNOSIS — R0609 Other forms of dyspnea: Secondary | ICD-10-CM

## 2019-12-04 NOTE — Telephone Encounter (Signed)
Dr. Domenic Polite Mrs. Denise [name] stated that she cannot be out of work for the 2 day [employer]. She can only do the test on Fridays. I checked with Nuclear Medicine and they said that because she is under 300 lbs they could up the dose . Can you advise?  We can do a 1 day protocol if that is more convenient and nuclear medicine agrees.

## 2019-12-04 NOTE — Patient Instructions (Addendum)
Medication Instructions:   Your physician recommends that you continue on your current medications as directed. Please refer to the Current Medication list given to you today.  Labwork:  Your physician recommends that you return for a FASTING lipid profile: within the next week. This may be done at [facility] or [facility] ([place]) Monday-Friday from 8:00 am - 4:00 pm. No appointment is needed.  Testing/Procedures: Your physician has requested that you have a lexiscan myoview. For further information please visit [email or web address]. Please follow instruction sheet, as given.  Follow-Up:  Your physician recommends that you schedule a follow-up appointment in: pending.  Any Other Special Instructions Will Be Listed Below (If Applicable).  If you need a refill on your cardiac medications before your next appointment, please call your pharmacy.

## 2019-12-04 NOTE — Telephone Encounter (Signed)
Pre-cert Verification for the following procedure    LEXISCAN MYOVIEW   DATE:   12/12/2019  LOCATION: [facility]

## 2019-12-10 ENCOUNTER — Other Ambulatory Visit: Payer: Self-pay

## 2019-12-10 ENCOUNTER — Ambulatory Visit (HOSPITAL_COMMUNITY)
Admission: RE | Admit: 2019-12-10 | Discharge: 2019-12-10 | Disposition: A | Discharge status: Home / Self Care | Payer: Medicare Other | Source: Ambulatory Visit | Attending: Hematology | Admitting: Hematology

## 2019-12-10 DIAGNOSIS — J8410 Pulmonary fibrosis, unspecified: Secondary | ICD-10-CM | POA: Diagnosis not present

## 2019-12-10 DIAGNOSIS — I2510 Atherosclerotic heart disease of native coronary artery without angina pectoris: Secondary | ICD-10-CM | POA: Diagnosis not present

## 2019-12-10 DIAGNOSIS — I313 Pericardial effusion (noninflammatory): Secondary | ICD-10-CM | POA: Diagnosis not present

## 2019-12-10 DIAGNOSIS — C3492 Malignant neoplasm of unspecified part of left bronchus or lung: Secondary | ICD-10-CM

## 2019-12-10 DIAGNOSIS — M47816 Spondylosis without myelopathy or radiculopathy, lumbar region: Secondary | ICD-10-CM | POA: Diagnosis not present

## 2019-12-10 DIAGNOSIS — M48061 Spinal stenosis, lumbar region without neurogenic claudication: Secondary | ICD-10-CM | POA: Diagnosis not present

## 2019-12-10 DIAGNOSIS — M5126 Other intervertebral disc displacement, lumbar region: Secondary | ICD-10-CM | POA: Diagnosis not present

## 2019-12-10 DIAGNOSIS — I700 Atherosclerosis of aorta: Secondary | ICD-10-CM | POA: Diagnosis not present

## 2019-12-10 DIAGNOSIS — M4319 Spondylolisthesis, multiple sites in spine: Secondary | ICD-10-CM | POA: Diagnosis not present

## 2019-12-10 IMAGING — MR MR LUMBAR SPINE WO/W CM
6 of 7 series · 32 of 48 positions shown · IV contrast (7ML Gadavist)
Comparison: [DATE] CT abdomen pelvis. [DATE] MRI lumbar
spine and prior.

CLINICAL DATA: Low back pain

EXAM:
MRI LUMBAR SPINE WITHOUT AND WITH CONTRAST
TECHNIQUE: Multiplanar and multiecho pulse sequences of the lumbar spine were
obtained without and with intravenous contrast.
CONTRAST:  7.5mL GADAVIST GADOBUTROL 1 MMOL/ML IV SOLN

[Series 9: T2 · sagittal · 4.0mm · 0.68mm/px · 5 of 16 slices shown (1 of 2)]
[im 1/16]
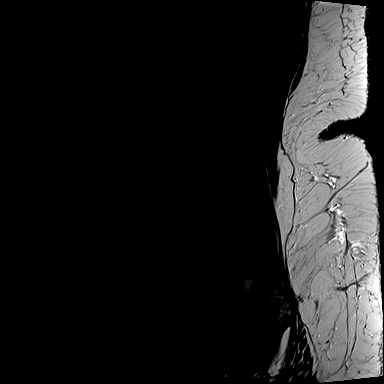
[im 4/16]
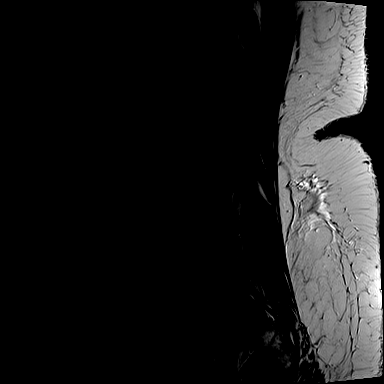
[im 8/16]
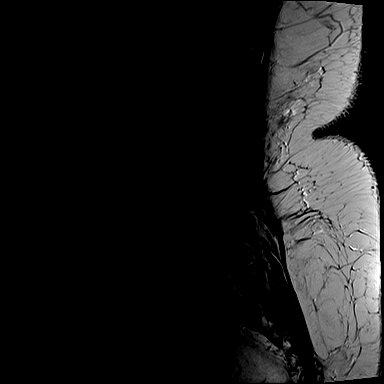
[im 12/16]
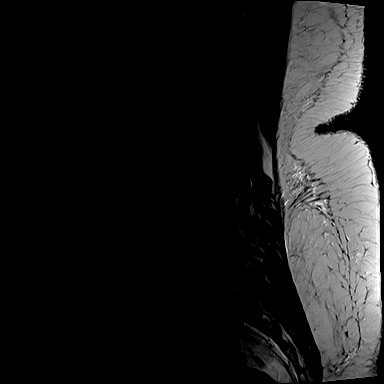
[im 16/16]
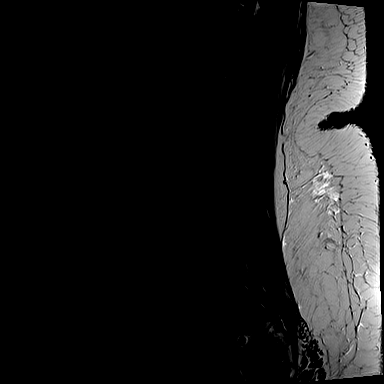

[Series 10: T1 · sagittal · 4.0mm · 0.81mm/px · 4 of 15 slices shown (1 of 3)]
[im 1/15]
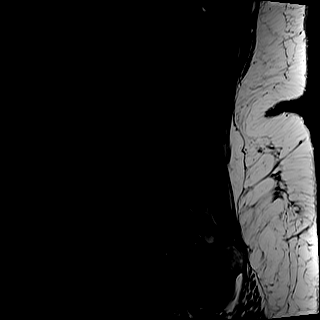
[im 5/15]
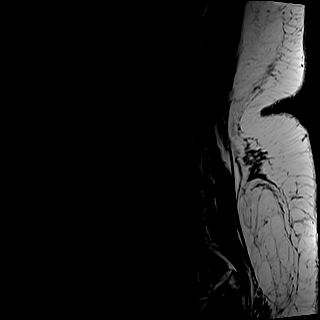
[im 10/15]
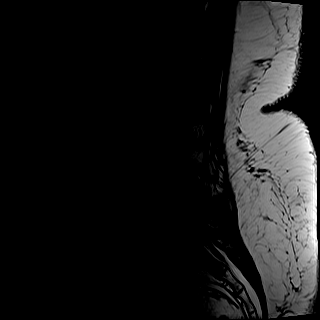
[im 15/15]
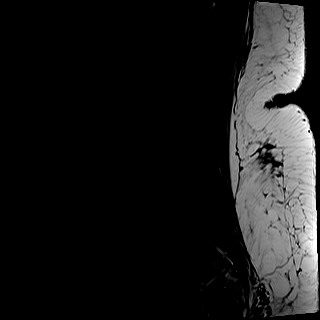

[Series 12: T2 · axial · 4.0mm · 0.70mm/px · z∈[-6,+187]mm · 8 of 33 slices shown (2 of 2)]
[im 1/33]
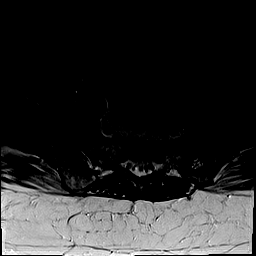
[im 4/33]
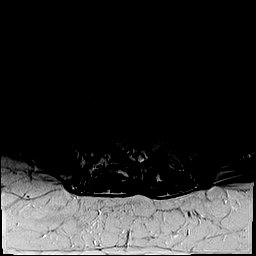
[im 11/33]
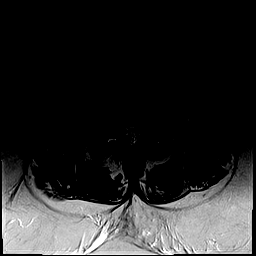
[im 15/33]
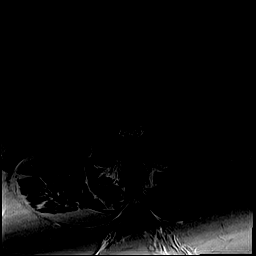
[im 18/33]
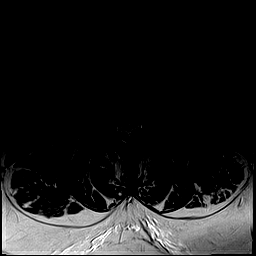
[im 22/33]
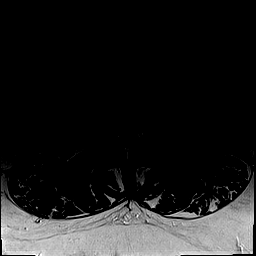
[im 29/33]
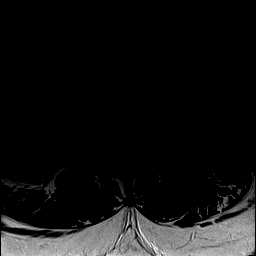
[im 33/33]
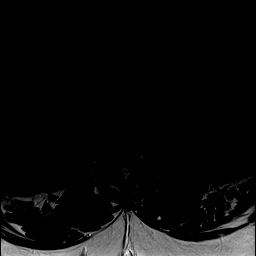

[Series 13: T1 · axial · 4.0mm · 0.35mm/px · z∈[-6,+187]mm · 8 of 33 slices shown (2 of 3)]
[im 1/33]
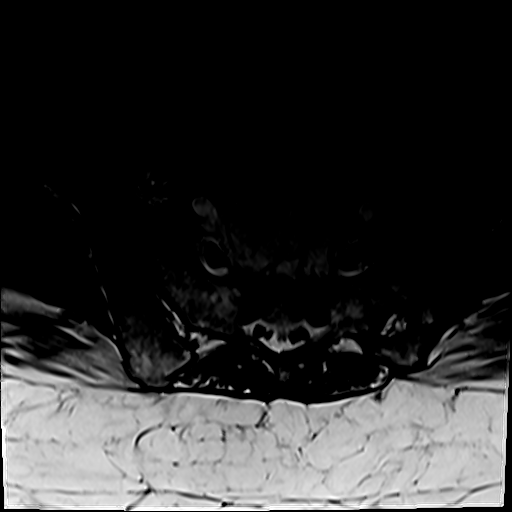
[im 4/33]
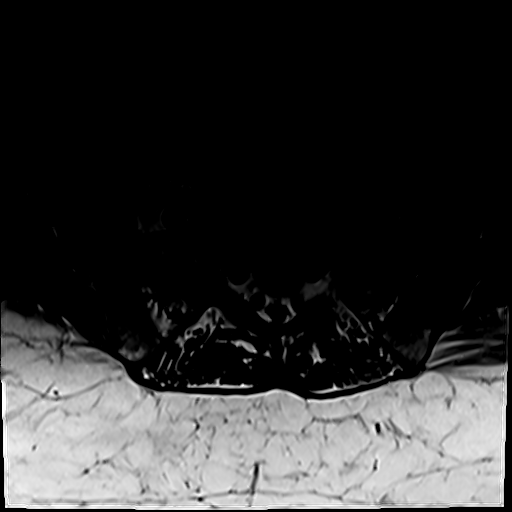
[im 11/33]
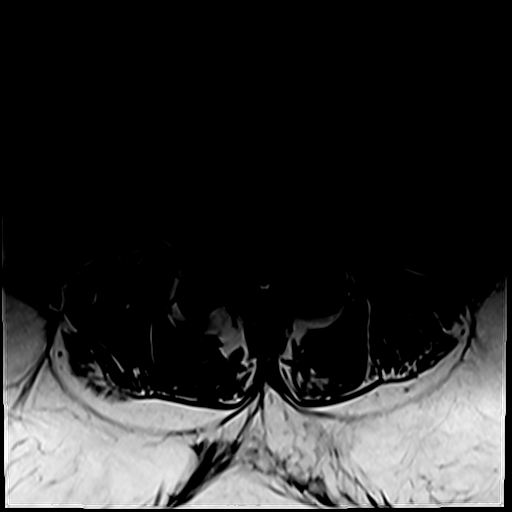
[im 15/33]
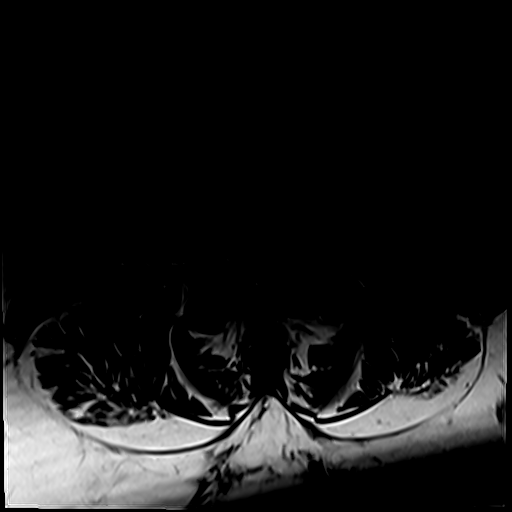
[im 18/33]
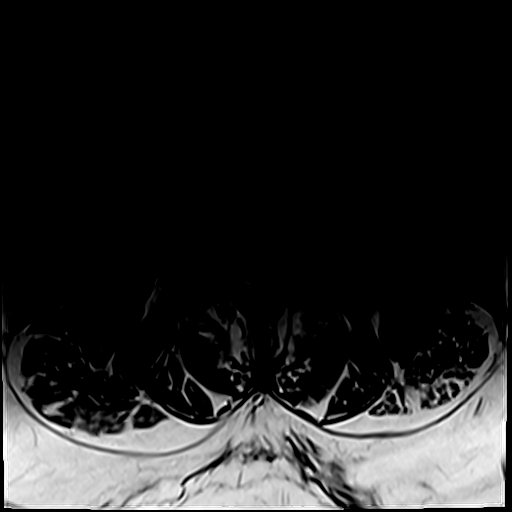
[im 22/33]
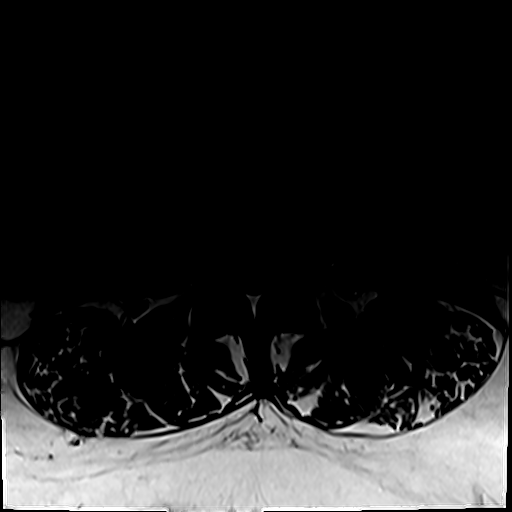
[im 29/33]
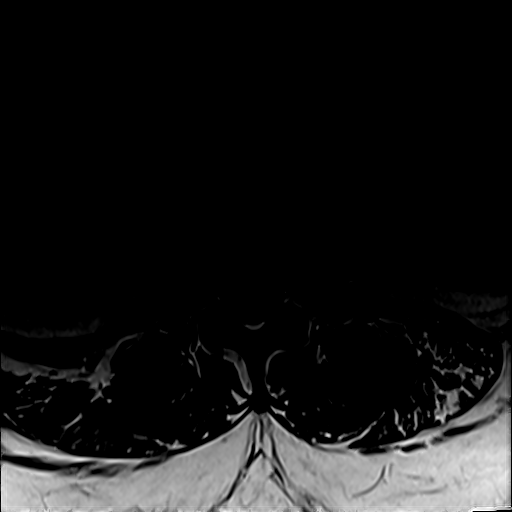
[im 33/33]
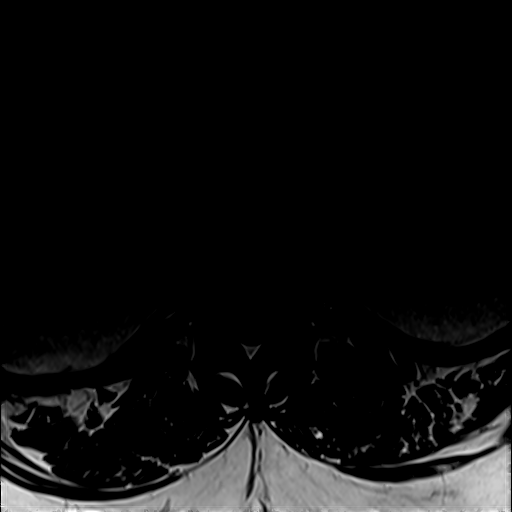

[Series 14: T1 fat-sat post-contrast · sagittal · 4.0mm · 0.90mm/px · 5 of 16 slices shown]
[im 1/16]
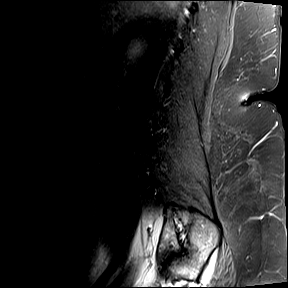
[im 4/16]
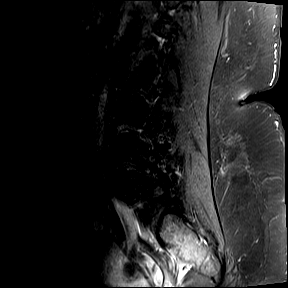
[im 8/16]
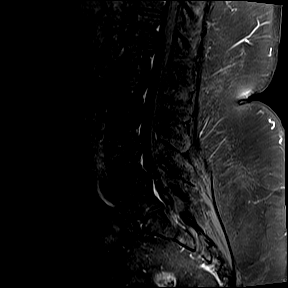
[im 12/16]
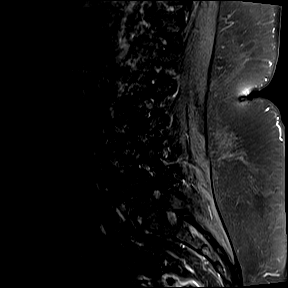
[im 16/16]
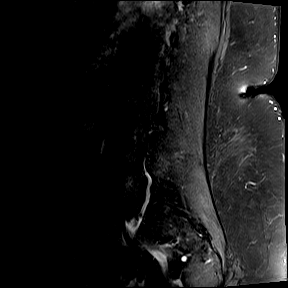

[Series 15: T1 · axial · 4.0mm · 0.35mm/px · z∈[-6,+11]mm · 2 of 33 slices shown (3 of 3)]
[im 1/33]
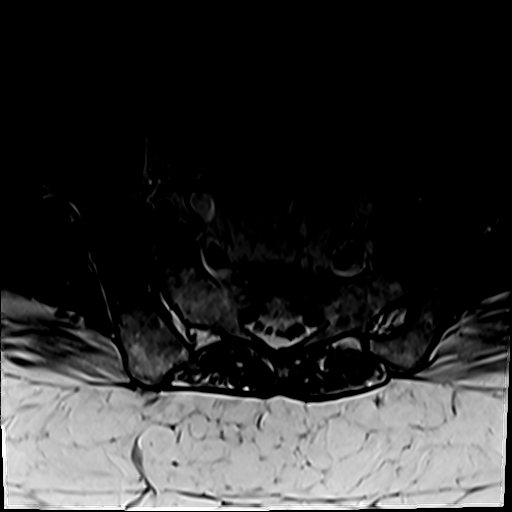
[im 4/33]
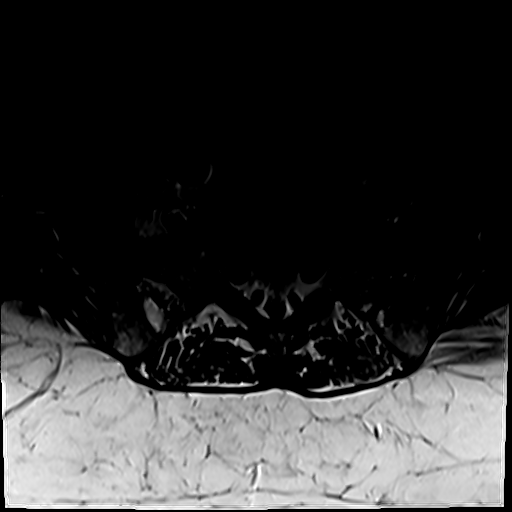

[32 of 48 positions shown; findings below may reference images not displayed]

FINDINGS: Segmentation:  Standard.

Alignment: Grade 1 L4-5 anterolisthesis. Minimal grade 1 L5-S1
retrolisthesis.

Vertebrae: Vertebral body heights are preserved. 5 mm focal STIR
hyperintensity and enhancement involving the right L5 lamina ([DATE],
[DATE]).

Conus medullaris and cauda equina: Conus extends to the L1-2 level.
Conus and cauda equina appear normal. No abnormal enhancement.

Disc levels: Multilevel desiccation with mild disc space loss most
prominent at the L3-S1 levels.

L1-2: Minimal disc bulge with right extraforaminal annular
fissuring. Patent spinal canal and neural foramen.

L2-3: Disc bulge and bilateral facet hypertrophy. Patent spinal
canal and neural foramen.

L3-4: Disc bulge with small superimposed left foraminal
protrusion/annular fissuring. Ligamentum flavum and bilateral facet
hypertrophy. Mild spinal canal and bilateral neural foraminal
narrowing.

L4-5: Right predominant disc bulge, ligamentum flavum and bilateral
facet hypertrophy. Trace bilateral facet effusions. Mild spinal
canal and bilateral neural foraminal narrowing.

L5-S1: Disc bulge with superimposed central and right foraminal
protrusions. Bilateral facet hypertrophy. Patent spinal canal. Mild
bilateral neural foraminal narrowing.

Paraspinal and other soft tissues: Negative.
IMPRESSION: 5 mm right L5 lamina enhancing focus is likely degenerative given
proximity to the facet joint. Consider short-term interval follow-up
if suspicion persists.

Multilevel spondylosis as detailed above.

## 2019-12-10 IMAGING — CT CT CHEST W/ CM
2 of 3 series · 15 of 36 positions shown, 18 images · IV contrast (Omnipaque or Isovue)
Comparison: [DATE], [DATE]

CLINICAL DATA: Metastatic lung cancer, assess treatment response,
concern for pneumonitis

EXAM:
CT CHEST WITH CONTRAST
TECHNIQUE: Multidetector CT imaging of the chest was performed during
intravenous contrast administration.
CONTRAST:  75mL OMNIPAQUE IOHEXOL 300 MG/ML  SOLN

[Series 2: routine chest with · axial · 0.57mm/px · z∈[-124,+94]mm · 12 of 129 slices shown, 15 images]
[im 10/129  mediastinal]
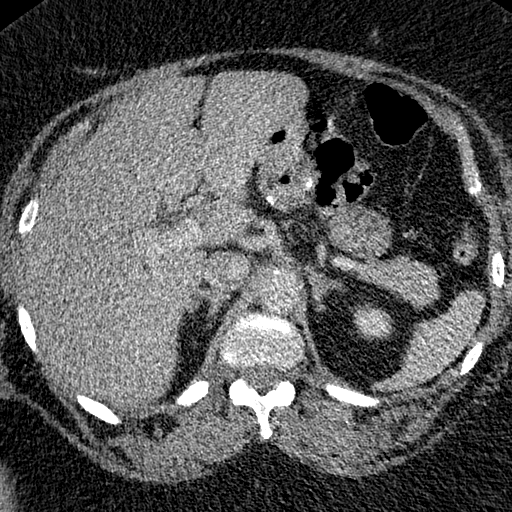
[im 10/129  lung]
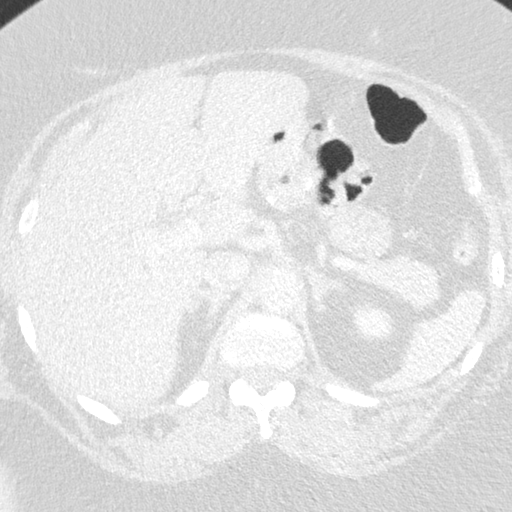
[im 19/129  lung]
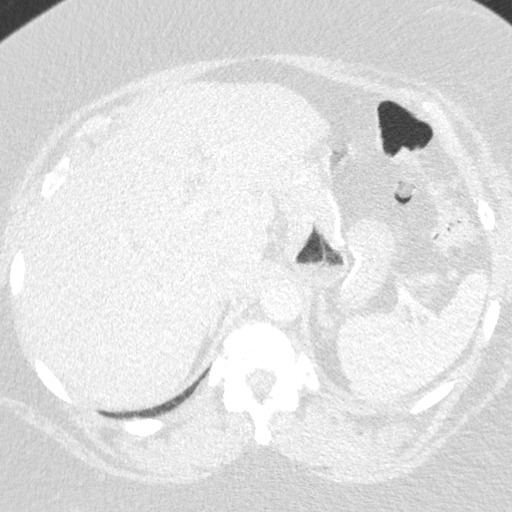
[im 29/129  lung]
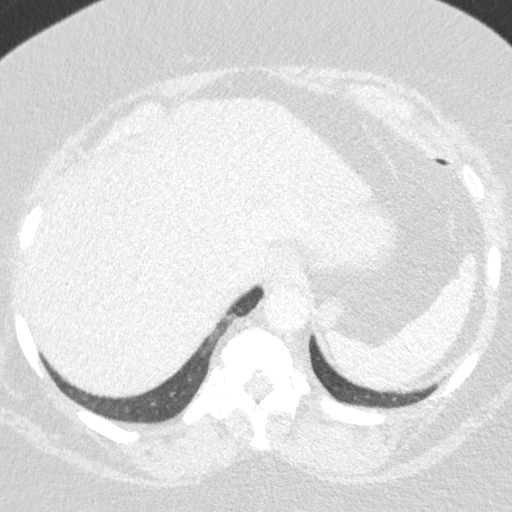
[im 38/129  lung]
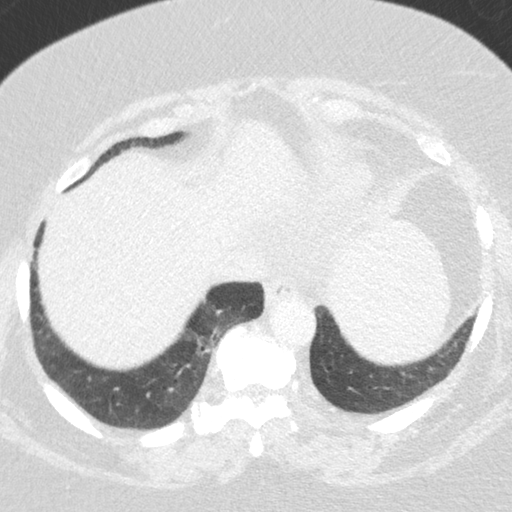
[im 48/129  mediastinal]
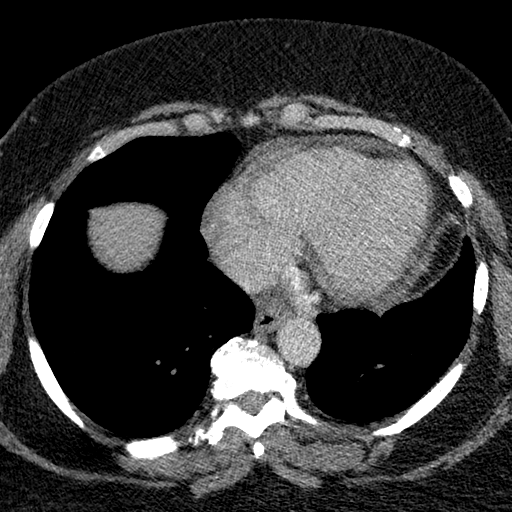
[im 48/129  lung]
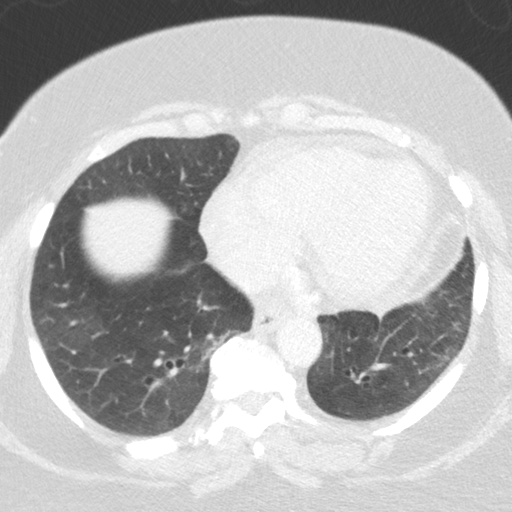
[im 57/129  lung]
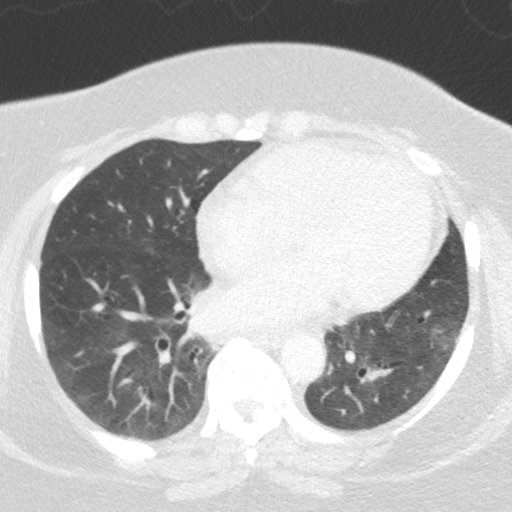
[im 72/129  lung]
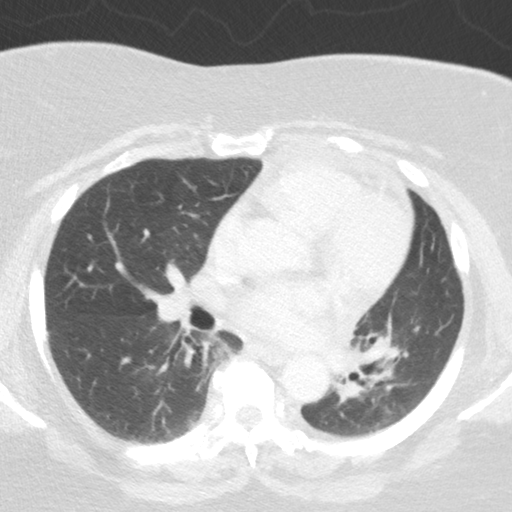
[im 81/129  lung]
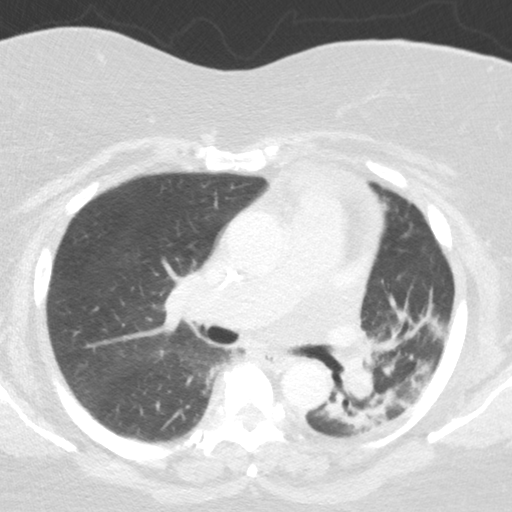
[im 91/129  mediastinal]
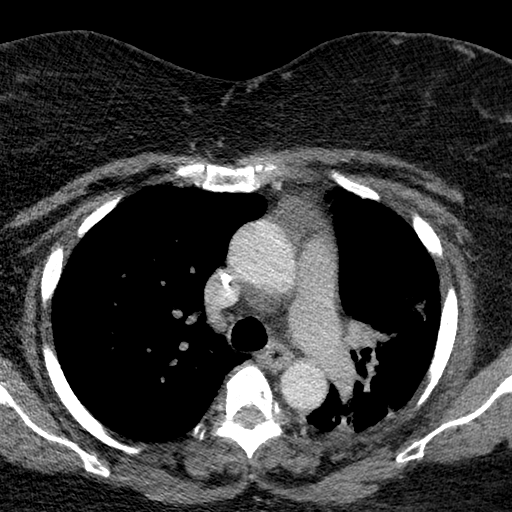
[im 91/129  lung]
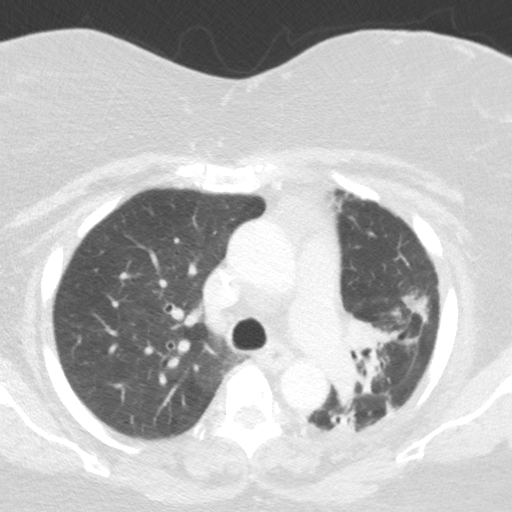
[im 100/129  lung]
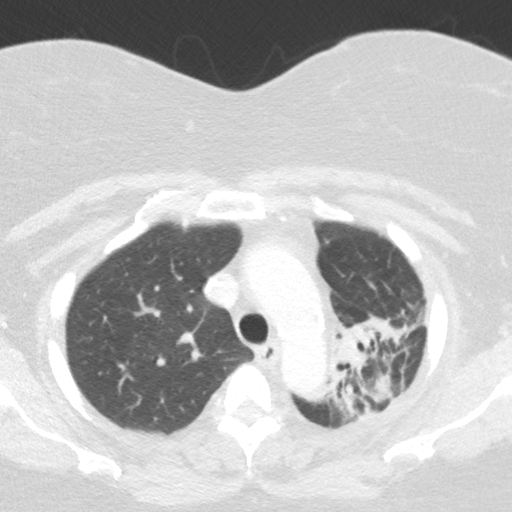
[im 110/129  lung]
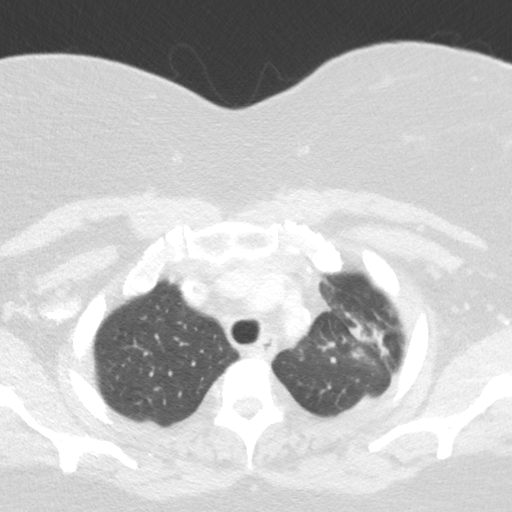
[im 119/129  lung]
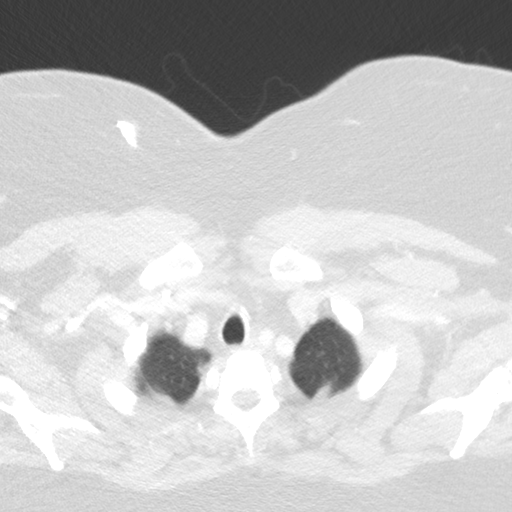

[Series 5: coronal · coronal · 0.52mm/px · 3 of 134 slices shown]
[im 27/134  lung]
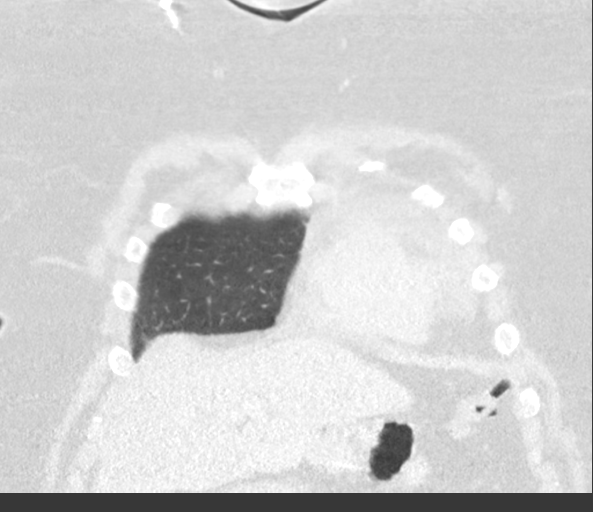
[im 54/134  lung]
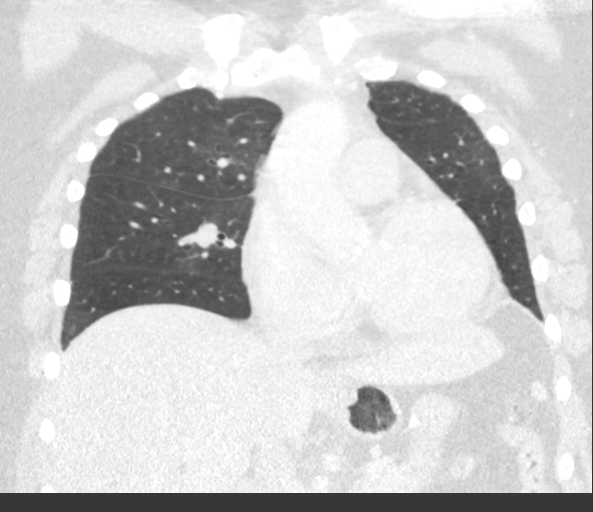
[im 80/134  lung]
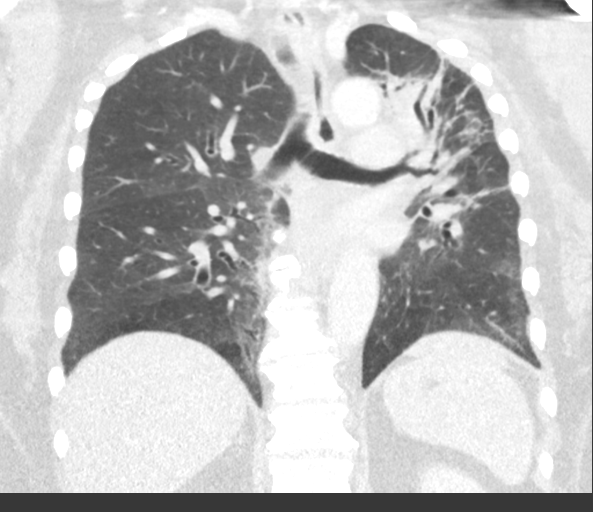

[15 of 36 positions shown; findings below may reference images not displayed]

FINDINGS: Cardiovascular: Aortic atherosclerosis. Aortic valve calcifications.
Normal heart size. Scattered coronary artery calcifications.
Unchanged small pericardial effusion. Right chest port catheter.

Mediastinum/Nodes: No enlarged mediastinal, hilar, or axillary lymph
nodes. Thyroid gland, trachea, and esophagus demonstrate no
significant findings.

Lungs/Pleura: No significant change in post treatment appearance of
a nodule of the posterior left upper lobe measuring 2.5 x 2.0 cm
(series 4, image 33). Unchanged adjacent fibrotic scarring,
architectural distortion, and volume loss. No pleural effusion or
pneumothorax.

Upper Abdomen: No acute abnormality. Postoperative findings of
cholecystectomy and Roux type gastric bypass in the included upper
abdomen.

Musculoskeletal: No chest wall mass or suspicious bone lesions
identified.
IMPRESSION: 1. Unchanged post treatment appearance of the left lung with
associated fibrotic scarring and volume loss.
2. No evidence of recurrent or metastatic disease in the chest.
3. Unchanged small pericardial effusion.
4. Coronary artery disease.
5. Aortic Atherosclerosis ([NF]-[NF]). Aortic valve
calcifications.

## 2019-12-10 MED ORDER — GADOBUTROL 1 MMOL/ML IV SOLN
7.5000 mL | Freq: Once | INTRAVENOUS | Status: AC | PRN
  Administered 2019-12-10: 7.5 mL via INTRAVENOUS

## 2019-12-10 MED ORDER — IOHEXOL 300 MG/ML  SOLN
75.0000 mL | Freq: Once | INTRAMUSCULAR | Status: AC | PRN
  Administered 2019-12-10: 75 mL via INTRAVENOUS

## 2019-12-11 ENCOUNTER — Inpatient Hospital Stay (HOSPITAL_BASED_OUTPATIENT_CLINIC_OR_DEPARTMENT_OTHER): Payer: Medicare Other | Admitting: Hematology

## 2019-12-11 ENCOUNTER — Inpatient Hospital Stay (HOSPITAL_COMMUNITY): Payer: Medicare Other

## 2019-12-11 VITALS — BP 148/68 | HR 85 | Temp 96.8°F | Resp 18 | Wt 285.4 lb

## 2019-12-11 VITALS — BP 150/65 | HR 79 | Temp 97.1°F | Resp 18

## 2019-12-11 DIAGNOSIS — I313 Pericardial effusion (noninflammatory): Secondary | ICD-10-CM | POA: Diagnosis not present

## 2019-12-11 DIAGNOSIS — C3492 Malignant neoplasm of unspecified part of left bronchus or lung: Secondary | ICD-10-CM

## 2019-12-11 DIAGNOSIS — Z95828 Presence of other vascular implants and grafts: Secondary | ICD-10-CM

## 2019-12-11 DIAGNOSIS — D329 Benign neoplasm of meninges, unspecified: Secondary | ICD-10-CM | POA: Diagnosis not present

## 2019-12-11 DIAGNOSIS — J700 Acute pulmonary manifestations due to radiation: Secondary | ICD-10-CM | POA: Diagnosis not present

## 2019-12-11 DIAGNOSIS — Z5112 Encounter for antineoplastic immunotherapy: Secondary | ICD-10-CM | POA: Diagnosis not present

## 2019-12-11 DIAGNOSIS — C3412 Malignant neoplasm of upper lobe, left bronchus or lung: Secondary | ICD-10-CM | POA: Diagnosis not present

## 2019-12-11 DIAGNOSIS — I10 Essential (primary) hypertension: Secondary | ICD-10-CM | POA: Diagnosis not present

## 2019-12-11 LAB — CBC WITH DIFFERENTIAL/PLATELET
Abs Immature Granulocytes: 0.01 10*3/uL (ref 0.00–0.07)
Basophils Absolute: 0.1 10*3/uL (ref 0.0–0.1)
Basophils Relative: 1 %
Eosinophils Absolute: 0.1 10*3/uL (ref 0.0–0.5)
Eosinophils Relative: 2 %
HCT: 38.1 % (ref 36.0–46.0)
Hemoglobin: 11.7 g/dL — ABNORMAL LOW (ref 12.0–15.0)
Immature Granulocytes: 0 %
Lymphocytes Relative: 13 %
Lymphs Abs: 0.6 10*3/uL — ABNORMAL LOW (ref 0.7–4.0)
MCH: 26.8 pg (ref 26.0–34.0)
MCHC: 30.7 g/dL (ref 30.0–36.0)
MCV: 87.2 fL (ref 80.0–100.0)
Monocytes Absolute: 0.4 10*3/uL (ref 0.1–1.0)
Monocytes Relative: 8 %
Neutro Abs: 3.7 10*3/uL (ref 1.7–7.7)
Neutrophils Relative %: 76 %
Platelets: 346 10*3/uL (ref 150–400)
RBC: 4.37 MIL/uL (ref 3.87–5.11)
RDW: 15.7 % — ABNORMAL HIGH (ref 11.5–15.5)
WBC: 4.8 10*3/uL (ref 4.0–10.5)
nRBC: 0 % (ref 0.0–0.2)

## 2019-12-11 LAB — COMPREHENSIVE METABOLIC PANEL
ALT: 21 U/L (ref 0–44)
AST: 20 U/L (ref 15–41)
Albumin: 3.6 g/dL (ref 3.5–5.0)
Alkaline Phosphatase: 74 U/L (ref 38–126)
Anion gap: 8 (ref 5–15)
BUN: 11 mg/dL (ref 8–23)
CO2: 25 mmol/L (ref 22–32)
Calcium: 8.8 mg/dL — ABNORMAL LOW (ref 8.9–10.3)
Chloride: 108 mmol/L (ref 98–111)
Creatinine, Ser: 0.69 mg/dL (ref 0.44–1.00)
GFR calc Af Amer: >60 mL/min (ref >60)
GFR calc non Af Amer: >60 mL/min (ref >60)
Glucose, Bld: 112 mg/dL — ABNORMAL HIGH (ref 70–99)
Potassium: 3.8 mmol/L (ref 3.5–5.1)
Sodium: 141 mmol/L (ref 135–145)
Total Bilirubin: 0.6 mg/dL (ref 0.3–1.2)
Total Protein: 6.4 g/dL — ABNORMAL LOW (ref 6.5–8.1)

## 2019-12-11 LAB — TSH: TSH: 1.449 u[IU]/mL (ref 0.350–4.500)

## 2019-12-11 LAB — LIPID PANEL
Cholesterol: 142 mg/dL (ref 0–200)
HDL: 56 mg/dL (ref >40)
LDL Cholesterol: 73 mg/dL (ref 0–99)
Total CHOL/HDL Ratio: 2.5 RATIO
Triglycerides: 66 mg/dL (ref <150)
VLDL: 13 mg/dL (ref 0–40)

## 2019-12-11 MED ORDER — SODIUM CHLORIDE 0.9 % IV SOLN
10.0000 mg/kg | Freq: Once | INTRAVENOUS | Status: AC
  Administered 2019-12-11: 1240 mg via INTRAVENOUS
  Filled 2019-12-11: qty 20

## 2019-12-11 MED ORDER — SODIUM CHLORIDE 0.9% FLUSH: 10.0000 mL | INTRAVENOUS | Status: DC | PRN

## 2019-12-11 MED ORDER — SODIUM CHLORIDE 0.9 % IV SOLN: Freq: Once | INTRAVENOUS | Status: AC

## 2019-12-11 MED ORDER — HEPARIN SOD (PORK) LOCK FLUSH 100 UNIT/ML IV SOLN
500.0000 [IU] | Freq: Once | INTRAVENOUS | Status: AC | PRN
  Administered 2019-12-11: 500 [IU]

## 2019-12-11 NOTE — Progress Notes (Signed)
Patient was assessed by Dr. Katragadda and labs have been reviewed.  Patient is okay to proceed with treatment today. Primary RN and pharmacy aware.   

## 2019-12-11 NOTE — Patient Instructions (Signed)
[facility] Discharge Instructions for Patients Receiving Chemotherapy  Today you received the following chemotherapy agents   To help prevent nausea and vomiting after your treatment, we encourage you to take your nausea medication    If you develop nausea and vomiting that is not controlled by your nausea medication, call the clinic.   BELOW ARE SYMPTOMS THAT SHOULD BE REPORTED IMMEDIATELY:  *FEVER GREATER THAN 100.5 F  *CHILLS WITH OR WITHOUT FEVER  NAUSEA AND VOMITING THAT IS NOT CONTROLLED WITH YOUR NAUSEA MEDICATION  *UNUSUAL SHORTNESS OF BREATH  *UNUSUAL BRUISING OR BLEEDING  TENDERNESS IN MOUTH AND THROAT WITH OR WITHOUT PRESENCE OF ULCERS  *URINARY PROBLEMS  *BOWEL PROBLEMS  UNUSUAL RASH Items with * indicate a potential emergency and should be followed up as soon as possible.  Feel free to call the clinic should you have any questions or concerns. The clinic phone number is [phone].  Please show the [place] at check-in to the Emergency Department and triage nurse.

## 2019-12-11 NOTE — Progress Notes (Signed)
[place], [place] [address]   CLINIC:  Medical Oncology/Hematology  PCP:  Iona Beard, [address] / [place] [address] [phone]   REASON FOR VISIT:  Follow-up for stage III left lung adenocarcinoma  PRIOR THERAPY:  1. Chemoradiation with carboplatin and paclitaxel from 01/07/2019 to 02/11/2019. 2. Consolidation with durvalumab from 03/19/2019 to 04/16/2019, held due to pneumonitis.  NGS Results: Not done  CURRENT THERAPY: Consolidation with durvalumab every 2 weeks  BRIEF ONCOLOGIC HISTORY:  Oncology History  Adenocarcinoma of left lung ([place])  12/09/2018 Initial Diagnosis   Adenocarcinoma of left lung ([place])   01/02/2019 Cancer Staging   Staging form: Lung, AJCC 8th Edition - Clinical stage from 01/02/2019: Stage IIIB (cT3, cN2, cM0) - Signed by Derek Jack, MD on 01/02/2019   01/07/2019 - 02/17/2019 Chemotherapy   The patient had palonosetron (ALOXI) injection 0.25 mg, 0.25 mg, Intravenous,  Once, 6 of 6 cycles Administration: 0.25 mg (01/07/2019), 0.25 mg (01/14/2019), 0.25 mg (01/21/2019), 0.25 mg (01/28/2019), 0.25 mg (02/04/2019), 0.25 mg (02/11/2019) CARBOplatin (PARAPLATIN) 270 mg in sodium chloride 0.9 % 250 mL chemo infusion, 270 mg (100 % of original dose 266.4 mg), Intravenous,  Once, 6 of 6 cycles Dose modification:   (original dose 266.4 mg, Cycle 1),   (original dose 266.4 mg, Cycle 2),   (original dose 266.4 mg, Cycle 3),   (original dose 266.4 mg, Cycle 4) Administration: 270 mg (01/07/2019), 270 mg (01/14/2019), 270 mg (01/21/2019), 270 mg (01/28/2019), 270 mg (02/04/2019), 270 mg (02/11/2019) PACLitaxel (TAXOL) 108 mg in sodium chloride 0.9 % 250 mL chemo infusion (</= 80mg /m2), 45 mg/m2 = 108 mg, Intravenous,  Once, 6 of 6 cycles Administration: 108 mg (01/07/2019), 108 mg (01/14/2019), 108 mg (01/21/2019), 108 mg (01/28/2019), 108 mg (02/04/2019), 108 mg (02/11/2019) fosaprepitant (EMEND) 150  mg, dexamethasone (DECADRON) 12 mg in sodium chloride 0.9 % 145 mL IVPB, , Intravenous,  Once, 5 of 5 cycles Administration:  (01/14/2019),  (01/21/2019),  (01/28/2019),  (02/04/2019),  (02/11/2019)  for chemotherapy treatment.    03/19/2019 -  Chemotherapy   The patient had durvalumab (IMFINZI) 1,240 mg in sodium chloride 0.9 % 100 mL chemo infusion, 9.9 mg/kg = 1,260 mg, Intravenous,  Once, 13 of 16 cycles Administration: 1,240 mg (03/19/2019), 1,240 mg (04/02/2019), 1,240 mg (04/16/2019), 1,240 mg (07/24/2019), 1,240 mg (08/07/2019), 1,240 mg (08/21/2019), 1,240 mg (09/04/2019), 1,240 mg (09/19/2019), 1,240 mg (10/02/2019), 1,240 mg (10/17/2019), 1,240 mg (10/31/2019), 1,240 mg (11/13/2019), 1,240 mg (11/27/2019)  for chemotherapy treatment.      CANCER STAGING: Cancer Staging Adenocarcinoma of left lung [facility]) Staging form: Lung, AJCC 8th Edition - Clinical stage from 01/02/2019: Stage IIIB (cT3, cN2, cM0) - Signed by Derek Jack, MD on 01/02/2019   INTERVAL HISTORY:  Denise [name], a 69 y.o. female, returns for routine follow-up and consideration for next cycle of chemotherapy. Denise [name] was last seen on 11/27/2019.  Due for cycle #14 of durvalumab today.   Overall, she tells me she has been feeling pretty well. She continues having belching after eating and back pain, similar to what she had before she treatment. She denies regurgitating food back. The back pain is worse when she is walking but eases when she sits down. Her SOB with exertion is stable. She denies N/V/D.  Overall, she feels ready for next cycle of chemo today.    REVIEW OF SYSTEMS:  Review of Systems  Constitutional: Positive for fatigue (mild). Negative for appetite change.  Respiratory: Positive for shortness of breath.   Gastrointestinal: Negative for diarrhea, nausea and vomiting.  Genitourinary: Positive for frequency.   Musculoskeletal: Positive for arthralgias (knees pain) and back pain (6/10 lower back pain).    All other systems reviewed and are negative.   PAST MEDICAL/SURGICAL HISTORY:  Past Medical History:  Diagnosis Date  . Anemia   . Aortic stenosis   . Arthritis   . Coronary artery calcification seen on CT scan   . Essential hypertension   . GERD (gastroesophageal reflux disease)   . History of lung cancer    Stage III adenocarcinoma status post chemoradiation  . Port-A-Cath in place 01/06/2019  . Type 2 diabetes mellitus ([facility])    Past Surgical History:  Procedure Laterality Date  . CHOLECYSTECTOMY  1997  . COLONOSCOPY    . GASTRIC BYPASS    . INCISIONAL HERNIA REPAIR  04/11/11  . IR IMAGING GUIDED PORT INSERTION  12/27/2018   Right  . LAPAROSCOPIC SALPINGOOPHERECTOMY    . LAPAROTOMY  04/11/2011   Procedure: EXPLORATORY LAPAROTOMY;  Surgeon: Joyice Faster. Cornett, MD;  Location: [facility];  Service: General;  Laterality: N/A;  closure port hole  . TOTAL HIP ARTHROPLASTY  03/07/2012   Procedure: TOTAL HIP ARTHROPLASTY ANTERIOR APPROACH;  Surgeon: Mauri Pole, MD;  Location: [facility];  Service: Orthopedics;  Laterality: Right;  . TOTAL SHOULDER ARTHROPLASTY Left 01/21/2015  . TOTAL SHOULDER ARTHROPLASTY Left 01/21/2015   Procedure: LEFT TOTAL SHOULDER ARTHROPLASTY;  Surgeon: Justice Britain, MD;  Location: [facility];  Service: Orthopedics;  Laterality: Left;  Marland Kitchen VAGINAL HYSTERECTOMY      SOCIAL HISTORY:  Social History   Socioeconomic History  . Marital status: Single    Spouse name: Not on file  . Number of children: Not on file  . Years of education: 12th grade  . Highest education level: Not on file  Occupational History  . Occupation: Employed    Employer: [employer]  Tobacco Use  . Smoking status: Former Smoker    Packs/day: 1.00    Years: 12.00    Pack years: 12.00    Types: Cigarettes    Quit date: 03/20/1976    Years since quitting: 43.7  . Smokeless tobacco: Never Used  Vaping Use  . Vaping Use: Never used  Substance and Sexual Activity  . Alcohol use: No   . Drug use: No  . Sexual activity: Not on file  Other Topics Concern  . Not on file  Social History Narrative  . Not on file   Social Determinants of Health   Financial Resource Strain:   . Difficulty of Paying Living Expenses: Not on file  Food Insecurity:   . Worried About Charity fundraiser in the Last Year: Not on file  . Ran Out of Food in the Last Year: Not on file  Transportation Needs:   . Lack of Transportation (Medical): Not on file  . Lack of Transportation (Non-Medical): Not on file  Physical Activity:   . Days of Exercise per Week: Not on file  . Minutes of Exercise per Session: Not on file  Stress:   . Feeling of Stress : Not on file  Social Connections:   . Frequency of Communication with Friends and Family: Not on file  . Frequency of Social Gatherings with Friends and Family: Not on file  . Attends Religious Services: Not on file  . Active Member of Clubs or Organizations: Not on file  . Attends Club or  Organization Meetings: Not on file  . Marital Status: Not on file  Intimate Partner Violence:   . Fear of Current or Ex-Partner: Not on file  . Emotionally Abused: Not on file  . Physically Abused: Not on file  . Sexually Abused: Not on file    FAMILY HISTORY:  Family History  Problem Relation Age of Onset  . Breast cancer Mother   . COPD Mother   . Arthritis Mother   . Diabetes Mother   . Hypertension Mother   . Hypertension Father   . Diabetes Father   . Breast cancer Sister   . Thyroid cancer Brother   . Huntington's disease Maternal Grandmother   . Heart attack Brother     CURRENT MEDICATIONS:  Current Outpatient Medications  Medication Sig Dispense Refill  . Acetaminophen (TYLENOL 8 HOUR ARTHRITIS PAIN PO) Take 1 tablet by mouth 2 (two) times daily.    Marland Kitchen amLODipine (NORVASC) 5 MG tablet Take 5 mg by mouth every morning.     Marland Kitchen aspirin EC 81 MG tablet Take 81 mg by mouth daily. Swallow whole.    . Calcium Carbonate Antacid (TUMS PO) Take  4 tablets by mouth daily.     . Cyanocobalamin (VITAMIN B-12) 2500 MCG SUBL Place 2,500 mcg under the tongue every morning.     . diphenhydrAMINE (BENADRYL) 25 MG tablet Take 25 mg by mouth every 6 (six) hours as needed.     Hunt Oris IV Inject into the vein every 14 (fourteen) days.    . ferrous sulfate 325 (65 FE) MG tablet Take 1 tablet (325 mg total) by mouth 3 (three) times daily after meals. (Patient taking differently: Take 325 mg by mouth daily. )    . furosemide (LASIX) 20 MG tablet TAKE 1 TABLET (20 MG TOTAL) BY MOUTH AS NEEDED FOR FLUID OR EDEMA (DAILY AS NEEDED FOR SWELLING). 30 tablet 3  . gabapentin (NEURONTIN) 300 MG capsule Take 300 mg by mouth at bedtime as needed for pain.     Marland Kitchen ibuprofen (ADVIL,MOTRIN) 800 MG tablet Take 1 tablet (800 mg total) by mouth 3 (three) times daily. 90 tablet 2  . Multiple Vitamin (MULITIVITAMIN WITH MINERALS) TABS Take 1 tablet by mouth every morning.     . naproxen (NAPROSYN) 500 MG tablet Take 500 mg by mouth as needed.     Marland Kitchen omeprazole (PRILOSEC) 20 MG capsule Take 20 mg by mouth daily.    Marland Kitchen HYDROcodone-acetaminophen (NORCO/VICODIN) 5-325 MG tablet Take 1 tablet by mouth 2 (two) times daily as needed.  (Patient not taking: Reported on 12/11/2019)     No current facility-administered medications for this visit.    ALLERGIES:  No Known Allergies  PHYSICAL EXAM:  Performance status (ECOG): 1 - Symptomatic but completely ambulatory  Vitals:   12/11/19 0830  BP: (!) 148/68  Pulse: 85  Resp: 18  Temp: (!) 96.8 F (36 C)  SpO2: 100%   Wt Readings from Last 3 Encounters:  12/11/19 285 lb 6.4 oz (129.5 kg)  12/04/19 286 lb (129.7 kg)  11/27/19 285 lb 12.8 oz (129.6 kg)   Physical Exam Vitals reviewed.  Constitutional:      Appearance: Normal appearance. She is obese.  Cardiovascular:     Rate and Rhythm: Normal rate and regular rhythm.     Pulses: Normal pulses.     Heart sounds: Normal heart sounds.  Pulmonary:     Effort:  Pulmonary effort is normal.     Breath sounds: Normal  breath sounds.  Chest:     Comments: Port-a-Cath in R chest Musculoskeletal:     Right lower leg: Edema (1+) present.     Left lower leg: Edema (1+) present.  Neurological:     General: No focal deficit present.     Mental Status: She is alert and oriented to person, place, and time.  Psychiatric:        Mood and Affect: Mood normal.        Behavior: Behavior normal.     LABORATORY DATA:  I have reviewed the labs as listed.  CBC Latest Ref Rng & Units 12/11/2019 11/27/2019 11/13/2019  WBC 4.0 - 10.5 K/uL 4.8 4.6 4.7  Hemoglobin 12.0 - 15.0 g/dL 11.7(L) 11.7(L) 11.6(L)  Hematocrit 36 - 46 % 38.1 37.7 37.4  Platelets 150 - 400 K/uL 346 339 312   CMP Latest Ref Rng & Units 12/11/2019 11/27/2019 11/13/2019  Glucose 70 - 99 mg/dL 112(H) 123(H) 108(H)  BUN 8 - 23 mg/dL 11 14 15   Creatinine 0.44 - 1.00 mg/dL 0.69 0.78 0.71  Sodium 135 - 145 mmol/L 141 142 141  Potassium 3.5 - 5.1 mmol/L 3.8 3.6 3.8  Chloride 98 - 111 mmol/L 108 108 109  CO2 22 - 32 mmol/L 25 24 25   Calcium 8.9 - 10.3 mg/dL 8.8(L) 9.0 8.7(L)  Total Protein 6.5 - 8.1 g/dL 6.4(L) 6.4(L) 6.3(L)  Total Bilirubin 0.3 - 1.2 mg/dL 0.6 0.5 0.4  Alkaline Phos 38 - 126 U/L 74 77 79  AST 15 - 41 U/L 20 19 16   ALT 0 - 44 U/L 21 21 21     DIAGNOSTIC IMAGING:  I have independently reviewed the scans and discussed with the patient. CT Chest W Contrast  Result Date: 12/11/2019 CLINICAL DATA:  Metastatic lung cancer, assess treatment response, concern for pneumonitis EXAM: CT CHEST WITH CONTRAST TECHNIQUE: Multidetector CT imaging of the chest was performed during intravenous contrast administration. CONTRAST:  13mL OMNIPAQUE IOHEXOL 300 MG/ML  SOLN COMPARISON:  09/29/2019, 07/02/2019 FINDINGS: Cardiovascular: Aortic atherosclerosis. Aortic valve calcifications. Normal heart size. Scattered coronary artery calcifications. Unchanged small pericardial effusion. Right chest port catheter.  Mediastinum/Nodes: No enlarged mediastinal, hilar, or axillary lymph nodes. Thyroid gland, trachea, and esophagus demonstrate no significant findings. Lungs/Pleura: No significant change in post treatment appearance of a nodule of the posterior left upper lobe measuring 2.5 x 2.0 cm (series 4, image 33). Unchanged adjacent fibrotic scarring, architectural distortion, and volume loss. No pleural effusion or pneumothorax. Upper Abdomen: No acute abnormality. Postoperative findings of cholecystectomy and Roux type gastric bypass in the included upper abdomen. Musculoskeletal: No chest wall mass or suspicious bone lesions identified. IMPRESSION: 1. Unchanged post treatment appearance of the left lung with associated fibrotic scarring and volume loss. 2. No evidence of recurrent or metastatic disease in the chest. 3. Unchanged small pericardial effusion. 4. Coronary artery disease. 5. Aortic Atherosclerosis (ICD10-I70.0). Aortic valve calcifications. Electronically Signed   By: Eddie Candle M.D.   On: 12/11/2019 08:58   MR Lumbar Spine W Wo Contrast  Result Date: 12/11/2019 CLINICAL DATA:  Low back pain EXAM: MRI LUMBAR SPINE WITHOUT AND WITH CONTRAST TECHNIQUE: Multiplanar and multiecho pulse sequences of the lumbar spine were obtained without and with intravenous contrast. CONTRAST:  7.80mL GADAVIST GADOBUTROL 1 MMOL/ML IV SOLN COMPARISON:  04/11/2011 CT abdomen pelvis. 12/11/2011 MRI lumbar spine and prior. FINDINGS: Segmentation:  Standard. Alignment: Grade 1 L4-5 anterolisthesis. Minimal grade 1 L5-S1 retrolisthesis. Vertebrae: Vertebral body heights are preserved. 5 mm focal STIR  hyperintensity and enhancement involving the right L5 lamina (15:23, 14:6). Conus medullaris and cauda equina: Conus extends to the L1-2 level. Conus and cauda equina appear normal. No abnormal enhancement. Disc levels: Multilevel desiccation with mild disc space loss most prominent at the L3-S1 levels. L1-2: Minimal disc bulge with  right extraforaminal annular fissuring. Patent spinal canal and neural foramen. L2-3: Disc bulge and bilateral facet hypertrophy. Patent spinal canal and neural foramen. L3-4: Disc bulge with small superimposed left foraminal protrusion/annular fissuring. Ligamentum flavum and bilateral facet hypertrophy. Mild spinal canal and bilateral neural foraminal narrowing. L4-5: Right predominant disc bulge, ligamentum flavum and bilateral facet hypertrophy. Trace bilateral facet effusions. Mild spinal canal and bilateral neural foraminal narrowing. L5-S1: Disc bulge with superimposed central and right foraminal protrusions. Bilateral facet hypertrophy. Patent spinal canal. Mild bilateral neural foraminal narrowing. Paraspinal and other soft tissues: Negative. IMPRESSION: 5 mm right L5 lamina enhancing focus is likely degenerative given proximity to the facet joint. Consider short-term interval follow-up if suspicion persists. Multilevel spondylosis as detailed above. Electronically Signed   By: Primitivo Gauze M.D.   On: 12/11/2019 09:25     ASSESSMENT:  1.Adenocarcinoma of left lung ([facility]) -Chemoradiation therapy with carboplatin and paclitaxel from 01/07/2019 through 02/11/2019. -Consolidation immunotherapy with durvalumab from 03/19/2019 through 04/15/2018, held due to pneumonitis. -CT chest on 07/02/2019 shows left upper lobe lung mass measuring 2.6 x 2.0 cm. It shows improvement in size. -MRI of the brain on 07/18/2019 showed left sphenoid wing meningioma measuring 2.6 x 2.0 x 2.6 cm unchanged. No new enhancing intracranial lesion. Increased left temporal white matter edema. -Durvalumab restarted on 07/24/2019. -We reviewed CT chest with contrast from 09/29/2019 which showed stable appearance of treated lung lesion within the posterior left upper lobe. New bandlike area of increased soft tissue density within the anterior to the treated lung lesion favored to be progressive changes from radiation. Local  tumor recurrence is less favored. Stable pericardial effusion.   PLAN:  1. Clinical stage IIIb (T3N2) adenocarcinoma of the lung: -We reviewed results of CT scan of the chest which did not show any evidence of progression of metastatic disease. -MRI of the lumbar spine showed degenerative disc disease. -Reviewed her labs today.  TSH is 1.449.  CBC and LFTs are grossly normal. -She will proceed with her treatment today.  We will see her back in 2 weeks for follow-up.  2. Shortness of breath on exertion: -This is improved when she started walking slowly.  This is staying stable.  3. Radiation pneumonitis: -Prednisone tapered off on 07/21/2019.  4. Meningioma: -No symptoms at this time.  5.Hypertension: -Continue Norvasc.  6.Bilateral leg swelling: -Continue Lasix as needed.  7. Bilateral knee pains, right more than left: -She had knee injections by Dr. Para March.  Pain has improved overall.   Orders placed this encounter:  Orders Placed This Encounter  Procedures  . Lipid panel     Derek Jack, MD [place] [phone]   I, Milinda Antis, am acting as a scribe for Dr. Sanda Linger.  I, Derek Jack MD, have reviewed the above documentation for accuracy and completeness, and I agree with the above.

## 2019-12-11 NOTE — Patient Instructions (Addendum)
[place] at [facility] Discharge Instructions  You were seen today by Dr. Delton Coombes. He went over your recent results and scans. You received your treatment today; continue receiving your treatments every 2 weeks. Dr. Delton Coombes will see you back in 4 weeks for labs and follow up.   Thank you for choosing [place] at [facility] to provide your oncology and hematology care.  To afford each patient quality time with our provider, please arrive at least 15 minutes before your scheduled appointment time.   If you have a lab appointment with the [facility] please come in thru the Main Entrance and check in at the main information desk  You need to re-schedule your appointment should you arrive 10 or more minutes late.  We strive to give you quality time with our providers, and arriving late affects you and other patients whose appointments are after yours.  Also, if you no show three or more times for appointments you may be dismissed from the clinic at the providers discretion.     Again, thank you for choosing [facility].  Our hope is that these requests will decrease the amount of time that you wait before being seen by our physicians.       _____________________________________________________________  Should you have questions after your visit to [facility], please contact our office at [phone] between the hours of 8:00 a.m. and 4:30 p.m.  Voicemails left after 4:00 p.m. will not be returned until the following business day.  For prescription refill requests, have your pharmacy contact our office and allow 72 hours.    Cancer Center Support Programs:   > Cancer Support Group  2nd Tuesday of the month 1pm-2pm, Journey Room

## 2019-12-11 NOTE — Progress Notes (Signed)
Labs reviewed with MD. Proceed as planned.   Treatment given per orders. Patient tolerated it well without problems. Vitals stable and discharged home from clinic ambulatory in stable condition.  Follow up as scheduled.

## 2019-12-12 ENCOUNTER — Encounter (HOSPITAL_COMMUNITY): Payer: Self-pay

## 2019-12-12 ENCOUNTER — Other Ambulatory Visit: Payer: Self-pay

## 2019-12-12 ENCOUNTER — Encounter (HOSPITAL_COMMUNITY)
Admission: RE | Admit: 2019-12-12 | Discharge: 2019-12-12 | Disposition: A | Discharge status: Home / Self Care | Payer: Medicare Other | Source: Ambulatory Visit | Attending: Cardiology | Admitting: Cardiology

## 2019-12-12 ENCOUNTER — Encounter (HOSPITAL_BASED_OUTPATIENT_CLINIC_OR_DEPARTMENT_OTHER)
Admission: RE | Admit: 2019-12-12 | Discharge: 2019-12-12 | Disposition: A | Discharge status: Home / Self Care | Payer: Medicare Other | Source: Ambulatory Visit | Attending: Cardiology | Admitting: Cardiology

## 2019-12-12 DIAGNOSIS — R0600 Dyspnea, unspecified: Secondary | ICD-10-CM

## 2019-12-12 DIAGNOSIS — R0609 Other forms of dyspnea: Secondary | ICD-10-CM

## 2019-12-12 HISTORY — DX: Malignant (primary) neoplasm, unspecified: C80.1

## 2019-12-12 MED ORDER — SODIUM CHLORIDE FLUSH 0.9 % IV SOLN
INTRAVENOUS | Status: AC
  Administered 2019-12-12: 10 mL via INTRAVENOUS
  Filled 2019-12-12: qty 10

## 2019-12-12 MED ORDER — TECHNETIUM TC 99M TETROFOSMIN IV KIT
30.0000 | PACK | Freq: Once | INTRAVENOUS | Status: AC | PRN
  Administered 2019-12-12: 30.4 via INTRAVENOUS

## 2019-12-12 MED ORDER — REGADENOSON 0.4 MG/5ML IV SOLN
INTRAVENOUS | Status: AC
  Administered 2019-12-12: 0.4 mg via INTRAVENOUS
  Filled 2019-12-12: qty 5

## 2019-12-12 MED ORDER — TECHNETIUM TC 99M TETROFOSMIN IV KIT
10.0000 | PACK | Freq: Once | INTRAVENOUS | Status: AC | PRN
  Administered 2019-12-12: 10.8 via INTRAVENOUS

## 2019-12-15 LAB — NM MYOCAR MULTI W/SPECT W/WALL MOTION / EF
LV dias vol: 70 mL (ref 46–106)
LV sys vol: 38 mL
Peak HR: 111 {beats}/min
RATE: 0.29
Rest HR: 68 {beats}/min
SDS: 2
SRS: 24
SSS: 26
TID: 1.4

## 2019-12-17 ENCOUNTER — Other Ambulatory Visit: Payer: Self-pay | Admitting: Cardiology

## 2019-12-17 ENCOUNTER — Encounter: Payer: Self-pay | Admitting: *Deleted

## 2019-12-17 ENCOUNTER — Telehealth: Payer: Self-pay | Admitting: *Deleted

## 2019-12-17 DIAGNOSIS — R9439 Abnormal result of other cardiovascular function study: Secondary | ICD-10-CM

## 2019-12-17 MED ORDER — SODIUM CHLORIDE 0.9% FLUSH: 3.0000 mL | Freq: Two times a day (BID) | INTRAVENOUS | Status: DC

## 2019-12-17 NOTE — Telephone Encounter (Signed)
-----   Message from Satira Sark, MD sent at 12/15/2019  6:07 PM EDT ----- Results reviewed.  Please let her know that the stress test was abnormal suggesting evidence of previous infarct scar and also residual mild to moderate ischemia which could be seen with obstructive CAD.  Given recent concerns about her multifactorial shortness of breath, a diagnostic cardiac catheterization would be useful to evaluate the situation further.  I would recommend that we set up a right and left heart catheterization given history of moderate aortic stenosis as well.

## 2019-12-17 NOTE — Telephone Encounter (Signed)
Patient informed and verbalized understanding of plan. Agrees to setting up heart cath now. Request heart cath be arranged for 12/29/2019. Advised that she will be contacted with details.

## 2019-12-17 NOTE — Progress Notes (Addendum)
Addendum to initial consultation.  Follow-up [facility] concerning for prior infarct scar and mild to moderate residual ischemia raising possibility of obstructive CAD.  In light of this and previously documented moderate aortic stenosis, left and right heart catheterization is being arranged.  Risks and benefits discussed and patient is in agreement to proceed.

## 2019-12-25 ENCOUNTER — Other Ambulatory Visit (HOSPITAL_COMMUNITY)
Admission: RE | Admit: 2019-12-25 | Discharge: 2019-12-25 | Disposition: A | Discharge status: Home / Self Care | Payer: Medicare Other | Source: Ambulatory Visit | Attending: Cardiovascular Disease | Admitting: Cardiovascular Disease

## 2019-12-25 ENCOUNTER — Telehealth: Payer: Self-pay | Admitting: *Deleted

## 2019-12-25 ENCOUNTER — Other Ambulatory Visit (HOSPITAL_COMMUNITY): Payer: Medicare Other

## 2019-12-25 ENCOUNTER — Ambulatory Visit (HOSPITAL_COMMUNITY): Payer: Medicare Other

## 2019-12-25 ENCOUNTER — Other Ambulatory Visit: Payer: Self-pay

## 2019-12-25 ENCOUNTER — Ambulatory Visit (HOSPITAL_COMMUNITY): Payer: Medicare Other | Admitting: Hematology

## 2019-12-25 DIAGNOSIS — Z20822 Contact with and (suspected) exposure to covid-19: Secondary | ICD-10-CM | POA: Diagnosis not present

## 2019-12-25 DIAGNOSIS — Z01812 Encounter for preprocedural laboratory examination: Secondary | ICD-10-CM | POA: Insufficient documentation

## 2019-12-25 NOTE — Telephone Encounter (Signed)
Pt contacted pre-catheterization scheduled at [facility] for: Monday December 29, 2019 7:30 AM  Verified arrival time and place: [facility]) at: 5:30 AM   No solid food after midnight prior to cath, clear liquids until 5 AM day of procedure.  Hold: Lasix-AM of procedure  Except hold medications AM meds can be  taken pre-cath with sips of water including: ASA 81 mg   Confirmed patient has responsible adult to drive home post procedure and be with patient first 24 hours after arriving home: yes  You are allowed ONE visitor in the waiting room during the time you are at the hospital for your procedure. Both you and your visitor must wear a mask once you enter the hospital.       COVID-19 Pre-Screening Questions:  . In the past 14 days have you had a new cough, new headache, new nasal congestion, fever (100.4 or greater) unexplained body aches, new sore throat, or sudden loss of taste or sense of smell? no . In the past 14 days have you been around anyone with known Covid 19? no . Have you been vaccinated for COVID-19? Yes, see immunization history   Reviewed procedure/mask/visitor instructions, COVID-19 questions with patient.

## 2019-12-26 ENCOUNTER — Inpatient Hospital Stay (HOSPITAL_COMMUNITY): Payer: Medicare Other

## 2019-12-26 ENCOUNTER — Encounter (HOSPITAL_COMMUNITY): Payer: Self-pay

## 2019-12-26 ENCOUNTER — Inpatient Hospital Stay (HOSPITAL_COMMUNITY): Payer: Medicare Other | Attending: Hematology

## 2019-12-26 VITALS — BP 160/85 | HR 88 | Temp 96.8°F | Resp 18

## 2019-12-26 DIAGNOSIS — Z5112 Encounter for antineoplastic immunotherapy: Secondary | ICD-10-CM | POA: Diagnosis not present

## 2019-12-26 DIAGNOSIS — Z79899 Other long term (current) drug therapy: Secondary | ICD-10-CM | POA: Insufficient documentation

## 2019-12-26 DIAGNOSIS — C3492 Malignant neoplasm of unspecified part of left bronchus or lung: Secondary | ICD-10-CM

## 2019-12-26 DIAGNOSIS — Z95828 Presence of other vascular implants and grafts: Secondary | ICD-10-CM

## 2019-12-26 DIAGNOSIS — C3412 Malignant neoplasm of upper lobe, left bronchus or lung: Secondary | ICD-10-CM | POA: Diagnosis not present

## 2019-12-26 DIAGNOSIS — I10 Essential (primary) hypertension: Secondary | ICD-10-CM | POA: Diagnosis not present

## 2019-12-26 LAB — COMPREHENSIVE METABOLIC PANEL
ALT: 25 U/L (ref 0–44)
AST: 23 U/L (ref 15–41)
Albumin: 3.6 g/dL (ref 3.5–5.0)
Alkaline Phosphatase: 80 U/L (ref 38–126)
Anion gap: 8 (ref 5–15)
BUN: 11 mg/dL (ref 8–23)
CO2: 27 mmol/L (ref 22–32)
Calcium: 8.9 mg/dL (ref 8.9–10.3)
Chloride: 105 mmol/L (ref 98–111)
Creatinine, Ser: 0.71 mg/dL (ref 0.44–1.00)
GFR calc non Af Amer: >60 mL/min (ref >60)
Glucose, Bld: 106 mg/dL — ABNORMAL HIGH (ref 70–99)
Potassium: 4.2 mmol/L (ref 3.5–5.1)
Sodium: 140 mmol/L (ref 135–145)
Total Bilirubin: 0.3 mg/dL (ref 0.3–1.2)
Total Protein: 6.5 g/dL (ref 6.5–8.1)

## 2019-12-26 LAB — CBC WITH DIFFERENTIAL/PLATELET
Abs Immature Granulocytes: 0.01 10*3/uL (ref 0.00–0.07)
Basophils Absolute: 0 10*3/uL (ref 0.0–0.1)
Basophils Relative: 0 %
Eosinophils Absolute: 0.2 10*3/uL (ref 0.0–0.5)
Eosinophils Relative: 3 %
HCT: 38.2 % (ref 36.0–46.0)
Hemoglobin: 11.8 g/dL — ABNORMAL LOW (ref 12.0–15.0)
Immature Granulocytes: 0 %
Lymphocytes Relative: 13 %
Lymphs Abs: 0.6 10*3/uL — ABNORMAL LOW (ref 0.7–4.0)
MCH: 27.1 pg (ref 26.0–34.0)
MCHC: 30.9 g/dL (ref 30.0–36.0)
MCV: 87.8 fL (ref 80.0–100.0)
Monocytes Absolute: 0.4 10*3/uL (ref 0.1–1.0)
Monocytes Relative: 8 %
Neutro Abs: 3.6 10*3/uL (ref 1.7–7.7)
Neutrophils Relative %: 76 %
Platelets: 355 10*3/uL (ref 150–400)
RBC: 4.35 MIL/uL (ref 3.87–5.11)
RDW: 16.2 % — ABNORMAL HIGH (ref 11.5–15.5)
WBC: 4.9 10*3/uL (ref 4.0–10.5)
nRBC: 0 % (ref 0.0–0.2)

## 2019-12-26 LAB — SARS CORONAVIRUS 2 (TAT 6-24 HRS): SARS Coronavirus 2: NEGATIVE

## 2019-12-26 LAB — TSH: TSH: 2.256 u[IU]/mL (ref 0.350–4.500)

## 2019-12-26 MED ORDER — SODIUM CHLORIDE 0.9 % IV SOLN
10.0000 mg/kg | Freq: Once | INTRAVENOUS | Status: AC
  Administered 2019-12-26: 1240 mg via INTRAVENOUS
  Filled 2019-12-26: qty 4.8

## 2019-12-26 MED ORDER — SODIUM CHLORIDE 0.9 % IV SOLN: Freq: Once | INTRAVENOUS | Status: AC

## 2019-12-26 MED ORDER — HEPARIN SOD (PORK) LOCK FLUSH 100 UNIT/ML IV SOLN
500.0000 [IU] | Freq: Once | INTRAVENOUS | Status: AC | PRN
  Administered 2019-12-26: 500 [IU]

## 2019-12-26 NOTE — Patient Instructions (Signed)
[facility] Discharge Instructions for Patients Receiving Chemotherapy   Beginning January 23rd 2017 lab work for the [facility] will be done in the  Main lab at [facility] on 1st floor. If you have a lab appointment with the [place] please come in thru the  Main Entrance and check in at the main information desk   Today you received the following immunotherapy agents:  Imfinzi    If you develop nausea and vomiting, or diarrhea that is not controlled by your medication, call the clinic.  The clinic phone number is [phone]. Office hours are Monday-Friday 8:30am-5:00pm.  BELOW ARE SYMPTOMS THAT SHOULD BE REPORTED IMMEDIATELY:  *FEVER GREATER THAN 101.0 F  *CHILLS WITH OR WITHOUT FEVER  NAUSEA AND VOMITING THAT IS NOT CONTROLLED WITH YOUR NAUSEA MEDICATION  *UNUSUAL SHORTNESS OF BREATH  *UNUSUAL BRUISING OR BLEEDING  TENDERNESS IN MOUTH AND THROAT WITH OR WITHOUT PRESENCE OF ULCERS  *URINARY PROBLEMS  *BOWEL PROBLEMS  UNUSUAL RASH Items with * indicate a potential emergency and should be followed up as soon as possible. If you have an emergency after office hours please contact your primary care physician or go to the nearest emergency department.  Please call the clinic during office hours if you have any questions or concerns.   You may also contact the Patient Navigator at [phone] should you have any questions or need assistance in obtaining follow up care.      Resources For Cancer Patients and their Caregivers ? American Cancer Society: Can assist with transportation, wigs, general needs, runs Look Good Feel Better.        (541)282-4954 ? Cancer Care: Provides financial assistance, online support groups, medication/co-pay assistance.  1-800-813-HOPE (726)562-2041) ? [place] Assists [place] cancer patients and their families through emotional , educational and financial support.   804-666-9660 ? [place] DSS Where to apply for food stamps, Medicaid and utility assistance. (623) 804-8098 ? [facility]: Transportation to medical appointments. (820)347-3621 ? Social Security Administration: May apply for disability if have a Stage IV cancer. 814 [phone] ? [employer], Disability and Transit Services: Assists with nutrition, care and transit needs. 937-135-2269

## 2019-12-26 NOTE — Progress Notes (Signed)
Tolerated infusion w/o adverse reaction.  Alert, in no distress.  VSS.  Discharged ambulatory in stable condition.  

## 2019-12-26 NOTE — H&P (View-Only) (Signed)
Tolerated infusion w/o adverse reaction.  Alert, in no distress.  VSS.  Discharged ambulatory in stable condition.  

## 2019-12-29 ENCOUNTER — Encounter (HOSPITAL_COMMUNITY)
Admission: RE | Disposition: A | Discharge status: Home / Self Care | Payer: Medicare Other | Source: Home / Self Care | Attending: Cardiovascular Disease

## 2019-12-29 ENCOUNTER — Other Ambulatory Visit: Payer: Self-pay

## 2019-12-29 ENCOUNTER — Ambulatory Visit (HOSPITAL_COMMUNITY)
Admission: RE | Admit: 2019-12-29 | Discharge: 2019-12-29 | Disposition: A | Discharge status: Home / Self Care | Payer: Medicare Other | Attending: Cardiovascular Disease | Admitting: Cardiovascular Disease

## 2019-12-29 DIAGNOSIS — Z9049 Acquired absence of other specified parts of digestive tract: Secondary | ICD-10-CM | POA: Insufficient documentation

## 2019-12-29 DIAGNOSIS — M1990 Unspecified osteoarthritis, unspecified site: Secondary | ICD-10-CM | POA: Insufficient documentation

## 2019-12-29 DIAGNOSIS — I10 Essential (primary) hypertension: Secondary | ICD-10-CM | POA: Insufficient documentation

## 2019-12-29 DIAGNOSIS — E119 Type 2 diabetes mellitus without complications: Secondary | ICD-10-CM | POA: Insufficient documentation

## 2019-12-29 DIAGNOSIS — Z8249 Family history of ischemic heart disease and other diseases of the circulatory system: Secondary | ICD-10-CM | POA: Insufficient documentation

## 2019-12-29 DIAGNOSIS — Z6841 Body Mass Index (BMI) 40.0 and over, adult: Secondary | ICD-10-CM | POA: Insufficient documentation

## 2019-12-29 DIAGNOSIS — R9439 Abnormal result of other cardiovascular function study: Secondary | ICD-10-CM

## 2019-12-29 DIAGNOSIS — Z7982 Long term (current) use of aspirin: Secondary | ICD-10-CM | POA: Insufficient documentation

## 2019-12-29 DIAGNOSIS — Z85118 Personal history of other malignant neoplasm of bronchus and lung: Secondary | ICD-10-CM | POA: Insufficient documentation

## 2019-12-29 DIAGNOSIS — K219 Gastro-esophageal reflux disease without esophagitis: Secondary | ICD-10-CM | POA: Diagnosis not present

## 2019-12-29 DIAGNOSIS — Z96641 Presence of right artificial hip joint: Secondary | ICD-10-CM | POA: Diagnosis not present

## 2019-12-29 DIAGNOSIS — Z87891 Personal history of nicotine dependence: Secondary | ICD-10-CM | POA: Diagnosis not present

## 2019-12-29 DIAGNOSIS — Z833 Family history of diabetes mellitus: Secondary | ICD-10-CM | POA: Insufficient documentation

## 2019-12-29 DIAGNOSIS — E669 Obesity, unspecified: Secondary | ICD-10-CM | POA: Diagnosis not present

## 2019-12-29 DIAGNOSIS — Z8261 Family history of arthritis: Secondary | ICD-10-CM | POA: Diagnosis not present

## 2019-12-29 DIAGNOSIS — Z90710 Acquired absence of both cervix and uterus: Secondary | ICD-10-CM | POA: Insufficient documentation

## 2019-12-29 DIAGNOSIS — R0602 Shortness of breath: Secondary | ICD-10-CM | POA: Diagnosis not present

## 2019-12-29 DIAGNOSIS — I350 Nonrheumatic aortic (valve) stenosis: Secondary | ICD-10-CM | POA: Insufficient documentation

## 2019-12-29 DIAGNOSIS — Z79899 Other long term (current) drug therapy: Secondary | ICD-10-CM | POA: Diagnosis not present

## 2019-12-29 DIAGNOSIS — I272 Pulmonary hypertension, unspecified: Secondary | ICD-10-CM | POA: Diagnosis not present

## 2019-12-29 DIAGNOSIS — D649 Anemia, unspecified: Secondary | ICD-10-CM | POA: Diagnosis not present

## 2019-12-29 DIAGNOSIS — Z9884 Bariatric surgery status: Secondary | ICD-10-CM | POA: Insufficient documentation

## 2019-12-29 HISTORY — PX: RIGHT/LEFT HEART CATH AND CORONARY ANGIOGRAPHY: CATH118266

## 2019-12-29 LAB — POCT I-STAT EG7
Acid-Base Excess: 1 mmol/L (ref 0.0–2.0)
Acid-Base Excess: 2 mmol/L (ref 0.0–2.0)
Bicarbonate: 26.9 mmol/L (ref 20.0–28.0)
Bicarbonate: 27.2 mmol/L (ref 20.0–28.0)
Calcium, Ion: 1.26 mmol/L (ref 1.15–1.40)
Calcium, Ion: 1.26 mmol/L (ref 1.15–1.40)
HCT: 35 % — ABNORMAL LOW (ref 36.0–46.0)
HCT: 35 % — ABNORMAL LOW (ref 36.0–46.0)
Hemoglobin: 11.9 g/dL — ABNORMAL LOW (ref 12.0–15.0)
Hemoglobin: 11.9 g/dL — ABNORMAL LOW (ref 12.0–15.0)
O2 Saturation: 73 %
O2 Saturation: 71 %
Potassium: 4 mmol/L (ref 3.5–5.1)
Potassium: 4 mmol/L (ref 3.5–5.1)
Sodium: 143 mmol/L (ref 135–145)
Sodium: 142 mmol/L (ref 135–145)
TCO2: 28 mmol/L (ref 22–32)
TCO2: 29 mmol/L (ref 22–32)
pCO2, Ven: 46 mmHg (ref 44.0–60.0)
pCO2, Ven: 46.1 mmHg (ref 44.0–60.0)
pH, Ven: 7.375 (ref 7.250–7.430)
pH, Ven: 7.379 (ref 7.250–7.430)
pO2, Ven: 40 mmHg (ref 32.0–45.0)
pO2, Ven: 39 mmHg (ref 32.0–45.0)

## 2019-12-29 LAB — POCT I-STAT 7, (LYTES, BLD GAS, ICA,H+H)
Acid-Base Excess: 1 mmol/L (ref 0.0–2.0)
Bicarbonate: 26.2 mmol/L (ref 20.0–28.0)
Calcium, Ion: 1.23 mmol/L (ref 1.15–1.40)
HCT: 34 % — ABNORMAL LOW (ref 36.0–46.0)
Hemoglobin: 11.6 g/dL — ABNORMAL LOW (ref 12.0–15.0)
O2 Saturation: 99 %
Potassium: 3.9 mmol/L (ref 3.5–5.1)
Sodium: 142 mmol/L (ref 135–145)
TCO2: 27 mmol/L (ref 22–32)
pCO2 arterial: 42 mmHg (ref 32.0–48.0)
pH, Arterial: 7.402 (ref 7.350–7.450)
pO2, Arterial: 117 mmHg — ABNORMAL HIGH (ref 83.0–108.0)

## 2019-12-29 SURGERY — RIGHT/LEFT HEART CATH AND CORONARY ANGIOGRAPHY
Anesthesia: LOCAL

## 2019-12-29 MED ORDER — ASPIRIN 81 MG PO CHEW: 81.0000 mg | CHEWABLE_TABLET | ORAL | Status: DC

## 2019-12-29 MED ORDER — SODIUM CHLORIDE 0.9% FLUSH: 3.0000 mL | Freq: Two times a day (BID) | INTRAVENOUS | Status: DC

## 2019-12-29 MED ORDER — SODIUM CHLORIDE 0.9 % IV SOLN: INTRAVENOUS | Status: DC

## 2019-12-29 MED ORDER — ACETAMINOPHEN 325 MG PO TABS: 650.0000 mg | ORAL_TABLET | ORAL | Status: DC | PRN

## 2019-12-29 MED ORDER — SODIUM CHLORIDE 0.9% FLUSH: 3.0000 mL | INTRAVENOUS | Status: DC | PRN

## 2019-12-29 MED ORDER — LIDOCAINE HCL (PF) 1 % IJ SOLN
INTRAMUSCULAR | Status: AC
  Filled 2019-12-29: qty 30

## 2019-12-29 MED ORDER — ONDANSETRON HCL 4 MG/2ML IJ SOLN: 4.0000 mg | Freq: Four times a day (QID) | INTRAMUSCULAR | Status: DC | PRN

## 2019-12-29 MED ORDER — LABETALOL HCL 5 MG/ML IV SOLN: 10.0000 mg | INTRAVENOUS | Status: DC | PRN

## 2019-12-29 MED ORDER — VERAPAMIL HCL 2.5 MG/ML IV SOLN
INTRAVENOUS | Status: DC | PRN
  Administered 2019-12-29: 10 mL via INTRA_ARTERIAL

## 2019-12-29 MED ORDER — HYDRALAZINE HCL 20 MG/ML IJ SOLN: 10.0000 mg | INTRAMUSCULAR | Status: DC | PRN

## 2019-12-29 MED ORDER — SODIUM CHLORIDE 0.9 % IV SOLN: 250.0000 mL | INTRAVENOUS | Status: DC | PRN

## 2019-12-29 MED ORDER — FENTANYL CITRATE (PF) 100 MCG/2ML IJ SOLN
INTRAMUSCULAR | Status: AC
  Filled 2019-12-29: qty 2

## 2019-12-29 MED ORDER — HEPARIN (PORCINE) IN NACL 1000-0.9 UT/500ML-% IV SOLN
INTRAVENOUS | Status: DC | PRN
  Administered 2019-12-29 (×2): 500 mL

## 2019-12-29 MED ORDER — MIDAZOLAM HCL 2 MG/2ML IJ SOLN
INTRAMUSCULAR | Status: AC
  Filled 2019-12-29: qty 2

## 2019-12-29 MED ORDER — HEPARIN SODIUM (PORCINE) 1000 UNIT/ML IJ SOLN
INTRAMUSCULAR | Status: DC | PRN
  Administered 2019-12-29: 6500 [IU] via INTRAVENOUS

## 2019-12-29 MED ORDER — FENTANYL CITRATE (PF) 100 MCG/2ML IJ SOLN
INTRAMUSCULAR | Status: DC | PRN
Start: 2019-12-29 — End: 2019-12-29
  Administered 2019-12-29 (×2): 25 ug via INTRAVENOUS

## 2019-12-29 MED ORDER — IOHEXOL 350 MG/ML SOLN
INTRAVENOUS | Status: DC | PRN
  Administered 2019-12-29: 60 mL

## 2019-12-29 MED ORDER — LIDOCAINE HCL (PF) 1 % IJ SOLN
INTRAMUSCULAR | Status: DC | PRN
  Administered 2019-12-29 (×2): 2 mL

## 2019-12-29 MED ORDER — SODIUM CHLORIDE 0.9 % WEIGHT BASED INFUSION
3.0000 mL/kg/h | INTRAVENOUS | Status: AC
  Administered 2019-12-29: 3 mL/kg/h via INTRAVENOUS

## 2019-12-29 MED ORDER — DIAZEPAM 5 MG PO TABS: 5.0000 mg | ORAL_TABLET | ORAL | Status: DC | PRN

## 2019-12-29 MED ORDER — MIDAZOLAM HCL 2 MG/2ML IJ SOLN
INTRAMUSCULAR | Status: DC | PRN
  Administered 2019-12-29 (×2): 1 mg via INTRAVENOUS

## 2019-12-29 MED ORDER — VERAPAMIL HCL 2.5 MG/ML IV SOLN
INTRAVENOUS | Status: AC
  Filled 2019-12-29: qty 2

## 2019-12-29 MED ORDER — SODIUM CHLORIDE 0.9 % WEIGHT BASED INFUSION: 1.0000 mL/kg/h | INTRAVENOUS | Status: DC

## 2019-12-29 MED ORDER — HEPARIN (PORCINE) IN NACL 1000-0.9 UT/500ML-% IV SOLN
INTRAVENOUS | Status: AC
  Filled 2019-12-29: qty 1000

## 2019-12-29 SURGICAL SUPPLY — 14 items
CATH INFINITI JR4 5F (CATHETERS) ×1 IMPLANT
CATH OPTITORQUE TIG 4.0 5F (CATHETERS) ×1 IMPLANT
CATH SWAN GANZ 7F STRAIGHT (CATHETERS) ×1 IMPLANT
DEVICE RAD COMP TR BAND LRG (VASCULAR PRODUCTS) ×1 IMPLANT
GLIDESHEATH SLEND SS 6F .021 (SHEATH) ×2 IMPLANT
GLIDESHEATH SLENDER 7FR .021G (SHEATH) ×1 IMPLANT
GUIDEWIRE INQWIRE 1.5J.035X260 (WIRE) IMPLANT
INQWIRE 1.5J .035X260CM (WIRE) ×2
KIT HEART LEFT (KITS) ×2 IMPLANT
MAT PREVALON FULL STRYKER (MISCELLANEOUS) ×1 IMPLANT
PACK CARDIAC CATHETERIZATION (CUSTOM PROCEDURE TRAY) ×2 IMPLANT
TRANSDUCER W/STOPCOCK (MISCELLANEOUS) ×2 IMPLANT
TUBING CIL FLEX 10 FLL-RA (TUBING) ×2 IMPLANT
WIRE EMERALD ST .035X150CM (WIRE) ×1 IMPLANT

## 2019-12-29 NOTE — Interval H&P Note (Signed)
Cath Lab Visit (complete for each Cath Lab visit)  Clinical Evaluation Leading to the Procedure:   ACS: No.  Non-ACS:    Anginal Classification: CCS II  Anti-ischemic medical therapy: Minimal Therapy (1 class of medications)  Non-Invasive Test Results: Intermediate-risk stress test findings: cardiac mortality 1-3%/year  Prior CABG: No previous CABG      History and Physical Interval Note:  12/29/2019 7:48 AM  Denise [name]  has presented today for surgery, with the diagnosis of abnormal stress test.  The various methods of treatment have been discussed with the patient and family. After consideration of risks, benefits and other options for treatment, the patient has consented to  Procedure(s): RIGHT/LEFT HEART CATH AND CORONARY ANGIOGRAPHY (N/A) as a surgical intervention.  The patient's history has been reviewed, patient examined, no change in status, stable for surgery.  I have reviewed the patient's chart and labs.  Questions were answered to the patient's satisfaction.     Shelva Majestic

## 2019-12-29 NOTE — Discharge Instructions (Signed)
Radial Site Care  This sheet gives you information about how to care for yourself after your procedure. Your health care provider may also give you more specific instructions. If you have problems or questions, contact your health care provider. What can I expect after the procedure? After the procedure, it is common to have:  Bruising and tenderness at the catheter insertion area. Follow these instructions at home: Medicines  Take over-the-counter and prescription medicines only as told by your health care provider. Insertion site care  Follow instructions from your health care provider about how to take care of your insertion site. Make sure you: ? Wash your hands with soap and water before you change your bandage (dressing). If soap and water are not available, use hand sanitizer. ? Change your dressing as told by your health care provider. ? Leave stitches (sutures), skin glue, or adhesive strips in place. These skin closures may need to stay in place for 2 weeks or longer. If adhesive strip edges start to loosen and curl up, you may trim the loose edges. Do not remove adhesive strips completely unless your health care provider tells you to do that.  Check your insertion site every day for signs of infection. Check for: ? Redness, swelling, or pain. ? Fluid or blood. ? Pus or a bad smell. ? Warmth.  Do not take baths, swim, or use a hot tub until your health care provider approves.  You may shower 24-48 hours after the procedure, or as directed by your health care provider. ? Remove the dressing and gently wash the site with plain soap and water. ? Pat the area dry with a clean towel. ? Do not rub the site. That could cause bleeding.  Do not apply powder or lotion to the site. Activity   For 24 hours after the procedure, or as directed by your health care provider: ? Do not flex or bend the affected arm. ? Do not push or pull heavy objects with the affected arm. ? Do not  drive yourself home from the hospital or clinic. You may drive 24 hours after the procedure unless your health care provider tells you not to. ? Do not operate machinery or power tools.  Do not lift anything that is heavier than 10 lb (4.5 kg), or the limit that you are told, until your health care provider says that it is safe.  Ask your health care provider when it is okay to: ? Return to work or school. ? Resume usual physical activities or sports. ? Resume sexual activity. General instructions  If the catheter site starts to bleed, raise your arm and put firm pressure on the site. If the bleeding does not stop, get help right away. This is a medical emergency.  If you went home on the same day as your procedure, a responsible adult should be with you for the first 24 hours after you arrive home.  Keep all follow-up visits as told by your health care provider. This is important. Contact a health care provider if:  You have a fever.  You have redness, swelling, or yellow drainage around your insertion site. Get help right away if:  You have unusual pain at the radial site.  The catheter insertion area swells very fast.  The insertion area is bleeding, and the bleeding does not stop when you hold steady pressure on the area.  Your arm or hand becomes pale, cool, tingly, or numb. These symptoms may represent a serious problem   that is an emergency. Do not wait to see if the symptoms will go away. Get medical help right away. Call your local emergency services (911 in the [place] Do not drive yourself to the hospital. Summary  After the procedure, it is common to have bruising and tenderness at the site.  Follow instructions from your health care provider about how to take care of your radial site wound. Check the wound every day for signs of infection.  Do not lift anything that is heavier than 10 lb (4.5 kg), or the limit that you are told, until your health care provider says  that it is safe. This information is not intended to replace advice given to you by your health care provider. Make sure you discuss any questions you have with your health care provider. Document Revised: 04/11/2017 Document Reviewed: 04/11/2017 [employer] Patient Education  2020 [employer]  

## 2019-12-29 NOTE — Progress Notes (Signed)
Pt ambulatory to bathroom. Tolerated well. Right wrist/brachial site unremarkable.  Will continue to monitor

## 2019-12-30 ENCOUNTER — Encounter (HOSPITAL_COMMUNITY): Payer: Self-pay | Admitting: Cardiovascular Disease

## 2020-01-08 ENCOUNTER — Inpatient Hospital Stay (HOSPITAL_COMMUNITY): Payer: Medicare Other

## 2020-01-08 ENCOUNTER — Inpatient Hospital Stay (HOSPITAL_BASED_OUTPATIENT_CLINIC_OR_DEPARTMENT_OTHER): Payer: Medicare Other | Admitting: Hematology

## 2020-01-08 VITALS — BP 122/55 | HR 87 | Temp 97.7°F | Resp 18

## 2020-01-08 VITALS — BP 119/64 | HR 89 | Temp 97.1°F | Resp 20 | Wt 283.0 lb

## 2020-01-08 DIAGNOSIS — I10 Essential (primary) hypertension: Secondary | ICD-10-CM | POA: Diagnosis not present

## 2020-01-08 DIAGNOSIS — C3492 Malignant neoplasm of unspecified part of left bronchus or lung: Secondary | ICD-10-CM

## 2020-01-08 DIAGNOSIS — C3412 Malignant neoplasm of upper lobe, left bronchus or lung: Secondary | ICD-10-CM | POA: Diagnosis not present

## 2020-01-08 DIAGNOSIS — Z5112 Encounter for antineoplastic immunotherapy: Secondary | ICD-10-CM | POA: Diagnosis not present

## 2020-01-08 DIAGNOSIS — Z95828 Presence of other vascular implants and grafts: Secondary | ICD-10-CM

## 2020-01-08 DIAGNOSIS — Z79899 Other long term (current) drug therapy: Secondary | ICD-10-CM | POA: Diagnosis not present

## 2020-01-08 LAB — CBC WITH DIFFERENTIAL/PLATELET
Abs Immature Granulocytes: 0.01 10*3/uL (ref 0.00–0.07)
Basophils Absolute: 0 10*3/uL (ref 0.0–0.1)
Basophils Relative: 0 %
Eosinophils Absolute: 0.1 10*3/uL (ref 0.0–0.5)
Eosinophils Relative: 2 %
HCT: 37.6 % (ref 36.0–46.0)
Hemoglobin: 11.9 g/dL — ABNORMAL LOW (ref 12.0–15.0)
Immature Granulocytes: 0 %
Lymphocytes Relative: 14 %
Lymphs Abs: 0.7 10*3/uL (ref 0.7–4.0)
MCH: 27.4 pg (ref 26.0–34.0)
MCHC: 31.6 g/dL (ref 30.0–36.0)
MCV: 86.4 fL (ref 80.0–100.0)
Monocytes Absolute: 0.4 10*3/uL (ref 0.1–1.0)
Monocytes Relative: 8 %
Neutro Abs: 3.7 10*3/uL (ref 1.7–7.7)
Neutrophils Relative %: 76 %
Platelets: 376 10*3/uL (ref 150–400)
RBC: 4.35 MIL/uL (ref 3.87–5.11)
RDW: 15.9 % — ABNORMAL HIGH (ref 11.5–15.5)
WBC: 4.9 10*3/uL (ref 4.0–10.5)
nRBC: 0 % (ref 0.0–0.2)

## 2020-01-08 LAB — COMPREHENSIVE METABOLIC PANEL
ALT: 19 U/L (ref 0–44)
AST: 21 U/L (ref 15–41)
Albumin: 3.7 g/dL (ref 3.5–5.0)
Alkaline Phosphatase: 74 U/L (ref 38–126)
Anion gap: 12 (ref 5–15)
BUN: 15 mg/dL (ref 8–23)
CO2: 25 mmol/L (ref 22–32)
Calcium: 9.1 mg/dL (ref 8.9–10.3)
Chloride: 106 mmol/L (ref 98–111)
Creatinine, Ser: 0.79 mg/dL (ref 0.44–1.00)
GFR, Estimated: >60 mL/min (ref >60)
Glucose, Bld: 106 mg/dL — ABNORMAL HIGH (ref 70–99)
Potassium: 3.9 mmol/L (ref 3.5–5.1)
Sodium: 143 mmol/L (ref 135–145)
Total Bilirubin: 0.4 mg/dL (ref 0.3–1.2)
Total Protein: 6.7 g/dL (ref 6.5–8.1)

## 2020-01-08 MED ORDER — SODIUM CHLORIDE 0.9 % IV SOLN: Freq: Once | INTRAVENOUS | Status: AC

## 2020-01-08 MED ORDER — SODIUM CHLORIDE 0.9 % IV SOLN
10.0000 mg/kg | Freq: Once | INTRAVENOUS | Status: AC
  Administered 2020-01-08: 1240 mg via INTRAVENOUS
  Filled 2020-01-08: qty 20

## 2020-01-08 MED ORDER — HEPARIN SOD (PORK) LOCK FLUSH 100 UNIT/ML IV SOLN
500.0000 [IU] | Freq: Once | INTRAVENOUS | Status: AC | PRN
  Administered 2020-01-08: 500 [IU]

## 2020-01-08 MED ORDER — SODIUM CHLORIDE 0.9% FLUSH
10.0000 mL | INTRAVENOUS | Status: DC | PRN
  Administered 2020-01-08: 10 mL

## 2020-01-08 NOTE — Patient Instructions (Signed)
[facility] Discharge Instructions for Patients Receiving Chemotherapy  Today you received the following chemotherapy agents   To help prevent nausea and vomiting after your treatment, we encourage you to take your nausea medication   If you develop nausea and vomiting that is not controlled by your nausea medication, call the clinic.   BELOW ARE SYMPTOMS THAT SHOULD BE REPORTED IMMEDIATELY:  *FEVER GREATER THAN 100.5 F  *CHILLS WITH OR WITHOUT FEVER  NAUSEA AND VOMITING THAT IS NOT CONTROLLED WITH YOUR NAUSEA MEDICATION  *UNUSUAL SHORTNESS OF BREATH  *UNUSUAL BRUISING OR BLEEDING  TENDERNESS IN MOUTH AND THROAT WITH OR WITHOUT PRESENCE OF ULCERS  *URINARY PROBLEMS  *BOWEL PROBLEMS  UNUSUAL RASH Items with * indicate a potential emergency and should be followed up as soon as possible.  Feel free to call the clinic should you have any questions or concerns. The clinic phone number is [phone].  Please show the CHEMO ALERT CARD at check-in to the Emergency Department and triage nurse.   

## 2020-01-08 NOTE — Progress Notes (Signed)
Patient was assessed by Dr. Katragadda and labs have been reviewed.  Patient is okay to proceed with treatment today. Primary RN and pharmacy aware.   

## 2020-01-08 NOTE — Progress Notes (Signed)
Denise [name], [place] [address]   CLINIC:  Medical Oncology/Hematology  PCP:  Iona Beard, [address] / [place] [address] [phone]   REASON FOR VISIT:  Follow-up for stage III left lung adenocarcinoma  PRIOR THERAPY:  1. Chemoradiation with carboplatin and paclitaxel from 01/07/2019 to 02/11/2019. 2. Consolidation with durvalumab from 03/19/2019 to 04/16/2019, held due to pneumonitis.  NGS Results: Not done  CURRENT THERAPY: Consolidation with durvalumab every 2 weeks  BRIEF ONCOLOGIC HISTORY:  Oncology History  Adenocarcinoma of left lung ([facility])  12/09/2018 Initial Diagnosis   Adenocarcinoma of left lung ([facility])   01/02/2019 Cancer Staging   Staging form: Lung, AJCC 8th Edition - Clinical stage from 01/02/2019: Stage IIIB (cT3, cN2, cM0) - Signed by Derek Jack, MD on 01/02/2019   01/07/2019 - 02/11/2019 Chemotherapy   The patient had palonosetron (ALOXI) injection 0.25 mg, 0.25 mg, Intravenous,  Once, 6 of 6 cycles Administration: 0.25 mg (01/07/2019), 0.25 mg (01/14/2019), 0.25 mg (01/21/2019), 0.25 mg (01/28/2019), 0.25 mg (02/04/2019), 0.25 mg (02/11/2019) CARBOplatin (PARAPLATIN) 270 mg in sodium chloride 0.9 % 250 mL chemo infusion, 270 mg (100 % of original dose 266.4 mg), Intravenous,  Once, 6 of 6 cycles Dose modification:   (original dose 266.4 mg, Cycle 1),   (original dose 266.4 mg, Cycle 2),   (original dose 266.4 mg, Cycle 3),   (original dose 266.4 mg, Cycle 4) Administration: 270 mg (01/07/2019), 270 mg (01/14/2019), 270 mg (01/21/2019), 270 mg (01/28/2019), 270 mg (02/04/2019), 270 mg (02/11/2019) PACLitaxel (TAXOL) 108 mg in sodium chloride 0.9 % 250 mL chemo infusion (</= 80mg /m2), 45 mg/m2 = 108 mg, Intravenous,  Once, 6 of 6 cycles Administration: 108 mg (01/07/2019), 108 mg (01/14/2019), 108 mg (01/21/2019), 108 mg (01/28/2019), 108 mg (02/04/2019), 108 mg (02/11/2019) fosaprepitant (EMEND) 150  mg, dexamethasone (DECADRON) 12 mg in sodium chloride 0.9 % 145 mL IVPB, , Intravenous,  Once, 5 of 5 cycles Administration:  (01/14/2019),  (01/21/2019),  (01/28/2019),  (02/04/2019),  (02/11/2019)  for chemotherapy treatment.    03/19/2019 -  Chemotherapy   The patient had durvalumab (IMFINZI) 1,240 mg in sodium chloride 0.9 % 100 mL chemo infusion, 9.9 mg/kg = 1,260 mg, Intravenous,  Once, 15 of 16 cycles Administration: 1,240 mg (03/19/2019), 1,240 mg (04/02/2019), 1,240 mg (04/16/2019), 1,240 mg (07/24/2019), 1,240 mg (08/07/2019), 1,240 mg (08/21/2019), 1,240 mg (09/04/2019), 1,240 mg (09/19/2019), 1,240 mg (10/02/2019), 1,240 mg (10/17/2019), 1,240 mg (10/31/2019), 1,240 mg (11/13/2019), 1,240 mg (11/27/2019), 1,240 mg (12/11/2019), 1,240 mg (12/26/2019)  for chemotherapy treatment.      CANCER STAGING: Cancer Staging Adenocarcinoma of left lung [facility]) Staging form: Lung, AJCC 8th Edition - Clinical stage from 01/02/2019: Stage IIIB (cT3, cN2, cM0) - Signed by Derek Jack, MD on 01/02/2019   INTERVAL HISTORY:  Denise [name], a 69 y.o. female, returns for routine follow-up and consideration for next cycle of chemotherapy. Jamonica was last seen on 12/11/2019.  Due for cycle #16 of durvalumab today.   Overall, she tells me she has been feeling pretty well. She reports that she did the cardiac cath and that it was negative for blockages. She continues having SOB with activity, but denies CP, diarrhea or skin rashes. Her appetite is excellent.  She will see Dr. Myles Gip PA on 11/12.   Overall, she feels ready for next cycle of chemo today.    REVIEW OF SYSTEMS:  Review of Systems  Constitutional: Positive for fatigue (50%). Negative for  appetite change.  Respiratory: Positive for shortness of breath (w/ exertion).   Cardiovascular: Negative for chest pain.  Gastrointestinal: Negative for diarrhea.  Genitourinary: Positive for frequency.   Musculoskeletal: Positive for arthralgias  (arthritis).  Skin: Negative for rash.  All other systems reviewed and are negative.   PAST MEDICAL/SURGICAL HISTORY:  Past Medical History:  Diagnosis Date   Anemia    Aortic stenosis    Arthritis    Cancer ([facility])    Lung   Coronary artery calcification seen on CT scan    Essential hypertension    GERD (gastroesophageal reflux disease)    History of lung cancer    Stage III adenocarcinoma status post chemoradiation   Port-A-Cath in place 01/06/2019   Type 2 diabetes mellitus [facility])    Past Surgical History:  Procedure Laterality Date   CHOLECYSTECTOMY  1997   COLONOSCOPY     GASTRIC BYPASS     INCISIONAL HERNIA REPAIR  04/11/11   IR IMAGING GUIDED PORT INSERTION  12/27/2018   Right   LAPAROSCOPIC SALPINGOOPHERECTOMY     LAPAROTOMY  04/11/2011   Procedure: EXPLORATORY LAPAROTOMY;  Surgeon: Joyice Faster. Cornett, MD;  Location: [facility];  Service: General;  Laterality: N/A;  closure port hole   RIGHT/LEFT HEART CATH AND CORONARY ANGIOGRAPHY N/A 12/29/2019   Procedure: RIGHT/LEFT HEART CATH AND CORONARY ANGIOGRAPHY;  Surgeon: Troy Sine, MD;  Location: [facility];  Service: Cardiovascular;  Laterality: N/A;   TOTAL HIP ARTHROPLASTY  03/07/2012   Procedure: TOTAL HIP ARTHROPLASTY ANTERIOR APPROACH;  Surgeon: Mauri Pole, MD;  Location: [facility];  Service: Orthopedics;  Laterality: Right;   TOTAL SHOULDER ARTHROPLASTY Left 01/21/2015   TOTAL SHOULDER ARTHROPLASTY Left 01/21/2015   Procedure: LEFT TOTAL SHOULDER ARTHROPLASTY;  Surgeon: Justice Britain, MD;  Location: [facility];  Service: Orthopedics;  Laterality: Left;   VAGINAL HYSTERECTOMY      SOCIAL HISTORY:  Social History   Socioeconomic History   Marital status: Single    Spouse name: Not on file   Number of children: Not on file   Years of education: 12th grade   Highest education level: Not on file  Occupational History   Occupation: Employed    Employer: [employer]  Tobacco  Use   Smoking status: Former Smoker    Packs/day: 1.00    Years: 12.00    Pack years: 12.00    Types: Cigarettes    Quit date: 03/20/1976    Years since quitting: 43.8   Smokeless tobacco: Never Used  Vaping Use   Vaping Use: Never used  Substance and Sexual Activity   Alcohol use: No   Drug use: No   Sexual activity: Not on file  Other Topics Concern   Not on file  Social History Narrative   Not on file   Social Determinants of Health   Financial Resource Strain:    Difficulty of Paying Living Expenses: Not on file  Food Insecurity:    Worried About Charity fundraiser in the Last Year: Not on file   [employer] of Food in the Last Year: Not on file  Transportation Needs:    Lack of Transportation (Medical): Not on file   Lack of Transportation (Non-Medical): Not on file  Physical Activity:    Days of Exercise per Week: Not on file   Minutes of Exercise per Session: Not on file  Stress:    Feeling of Stress : Not on file  Social Connections:  Frequency of Communication with Friends and Family: Not on file   Frequency of Social Gatherings with Friends and Family: Not on file   Attends Religious Services: Not on file   Active Member of Clubs or Organizations: Not on file   Attends Archivist Meetings: Not on file   Marital Status: Not on file  Intimate Partner Violence:    Fear of Current or Ex-Partner: Not on file   Emotionally Abused: Not on file   Physically Abused: Not on file   Sexually Abused: Not on file    FAMILY HISTORY:  Family History  Problem Relation Age of Onset   Breast cancer Mother    COPD Mother    Arthritis Mother    Diabetes Mother    Hypertension Mother    Hypertension Father    Diabetes Father    Breast cancer Sister    Thyroid cancer Brother    Huntington's disease Maternal Grandmother    Heart attack Brother     CURRENT MEDICATIONS:  Current Outpatient Medications  Medication Sig  Dispense Refill   Acetaminophen (TYLENOL 8 HOUR ARTHRITIS PAIN PO) Take 650 ng by mouth 2 (two) times daily.      amLODipine (NORVASC) 5 MG tablet Take 5 mg by mouth daily.      aspirin EC 81 MG tablet Take 81 mg by mouth daily. Swallow whole.     Calcium Carbonate Antacid (TUMS PO) Take 4 tablets by mouth daily.      carboxymethylcellul-glycerin (OPTIVE) 0.5-0.9 % ophthalmic solution Place 1-2 drops into both eyes 3 (three) times daily as needed (dry/irritated eyes.).     Cyanocobalamin (VITAMIN B-12) 2500 MCG SUBL Place 2,500 mcg under the tongue every morning.      diphenhydrAMINE (BENADRYL) 25 MG tablet Take 25 mg by mouth every 6 (six) hours as needed for itching or allergies.      DURVALUMAB IV Inject into the vein every 14 (fourteen) days.     ferrous sulfate 325 (65 FE) MG tablet Take 325 mg by mouth every evening.     furosemide (LASIX) 20 MG tablet TAKE 1 TABLET (20 MG TOTAL) BY MOUTH AS NEEDED FOR FLUID OR EDEMA (DAILY AS NEEDED FOR SWELLING). 30 tablet 3   Multiple Vitamin (MULITIVITAMIN WITH MINERALS) TABS Take 1 tablet by mouth daily.      naproxen (NAPROSYN) 500 MG tablet Take 500 mg by mouth every 12 (twelve) hours.      omeprazole (PRILOSEC) 20 MG capsule Take 20 mg by mouth daily.     Current Facility-Administered Medications  Medication Dose Route Frequency Provider Last Rate Last Admin   sodium chloride flush (NS) 0.9 % injection 3 mL  3 mL Intravenous Q12H Satira Sark, MD        ALLERGIES:  No Known Allergies  PHYSICAL EXAM:  Performance status (ECOG): 1 - Symptomatic but completely ambulatory  Vitals:   01/08/20 0848  BP: 119/64  Pulse: 89  Resp: 20  Temp: (!) 97.1 F (36.2 C)  SpO2: 99%   Wt Readings from Last 3 Encounters:  01/08/20 283 lb (128.4 kg)  12/29/19 288 lb (130.6 kg)  12/26/19 289 lb (131.1 kg)   Physical Exam Vitals reviewed.  Constitutional:      Appearance: Normal appearance. She is obese.  Cardiovascular:      Rate and Rhythm: Normal rate and regular rhythm.     Pulses: Normal pulses.     Heart sounds: Normal heart sounds.  Pulmonary:  Effort: Pulmonary effort is normal.     Breath sounds: Normal breath sounds.  Chest:     Comments: Port-a-Cath in R chest Musculoskeletal:     Right lower leg: No edema.     Left lower leg: No edema.  Neurological:     General: No focal deficit present.     Mental Status: She is alert and oriented to person, place, and time.  Psychiatric:        Mood and Affect: Mood normal.        Behavior: Behavior normal.     LABORATORY DATA:  I have reviewed the labs as listed.  CBC Latest Ref Rng & Units 01/08/2020 12/29/2019 12/29/2019  WBC 4.0 - 10.5 K/uL 4.9 - -  Hemoglobin 12.0 - 15.0 g/dL 11.9(L) 11.6(L) 11.9(L)  Hematocrit 36 - 46 % 37.6 34.0(L) 35.0(L)  Platelets 150 - 400 K/uL 376 - -   CMP Latest Ref Rng & Units 01/08/2020 12/29/2019 12/29/2019  Glucose 70 - 99 mg/dL 106(H) - -  BUN 8 - 23 mg/dL 15 - -  Creatinine 0.44 - 1.00 mg/dL 0.79 - -  Sodium 135 - 145 mmol/L 143 142 142  Potassium 3.5 - 5.1 mmol/L 3.9 3.9 4.0  Chloride 98 - 111 mmol/L 106 - -  CO2 22 - 32 mmol/L 25 - -  Calcium 8.9 - 10.3 mg/dL 9.1 - -  Total Protein 6.5 - 8.1 g/dL 6.7 - -  Total Bilirubin 0.3 - 1.2 mg/dL 0.4 - -  Alkaline Phos 38 - 126 U/L 74 - -  AST 15 - 41 U/L 21 - -  ALT 0 - 44 U/L 19 - -    DIAGNOSTIC IMAGING:  I have independently reviewed the scans and discussed with the patient. CT Chest W Contrast  Result Date: 12/11/2019 CLINICAL DATA:  Metastatic lung cancer, assess treatment response, concern for pneumonitis EXAM: CT CHEST WITH CONTRAST TECHNIQUE: Multidetector CT imaging of the chest was performed during intravenous contrast administration. CONTRAST:  20mL OMNIPAQUE IOHEXOL 300 MG/ML  SOLN COMPARISON:  09/29/2019, 07/02/2019 FINDINGS: Cardiovascular: Aortic atherosclerosis. Aortic valve calcifications. Normal heart size. Scattered coronary artery  calcifications. Unchanged small pericardial effusion. Right chest port catheter. Mediastinum/Nodes: No enlarged mediastinal, hilar, or axillary lymph nodes. Thyroid gland, trachea, and esophagus demonstrate no significant findings. Lungs/Pleura: No significant change in post treatment appearance of a nodule of the posterior left upper lobe measuring 2.5 x 2.0 cm (series 4, image 33). Unchanged adjacent fibrotic scarring, architectural distortion, and volume loss. No pleural effusion or pneumothorax. Upper Abdomen: No acute abnormality. Postoperative findings of cholecystectomy and Roux type gastric bypass in the included upper abdomen. Musculoskeletal: No chest wall mass or suspicious bone lesions identified. IMPRESSION: 1. Unchanged post treatment appearance of the left lung with associated fibrotic scarring and volume loss. 2. No evidence of recurrent or metastatic disease in the chest. 3. Unchanged small pericardial effusion. 4. Coronary artery disease. 5. Aortic Atherosclerosis (ICD10-I70.0). Aortic valve calcifications. Electronically Signed   By: Eddie Candle M.D.   On: 12/11/2019 08:58   MR Lumbar Spine W Wo Contrast  Result Date: 12/11/2019 CLINICAL DATA:  Low back pain EXAM: MRI LUMBAR SPINE WITHOUT AND WITH CONTRAST TECHNIQUE: Multiplanar and multiecho pulse sequences of the lumbar spine were obtained without and with intravenous contrast. CONTRAST:  7.61mL GADAVIST GADOBUTROL 1 MMOL/ML IV SOLN COMPARISON:  04/11/2011 CT abdomen pelvis. 12/11/2011 MRI lumbar spine and prior. FINDINGS: Segmentation:  Standard. Alignment: Grade 1 L4-5 anterolisthesis. Minimal grade 1 L5-S1 retrolisthesis.  Vertebrae: Vertebral body heights are preserved. 5 mm focal STIR hyperintensity and enhancement involving the right L5 lamina (15:23, 14:6). Conus medullaris and cauda equina: Conus extends to the L1-2 level. Conus and cauda equina appear normal. No abnormal enhancement. Disc levels: Multilevel desiccation with mild disc  space loss most prominent at the L3-S1 levels. L1-2: Minimal disc bulge with right extraforaminal annular fissuring. Patent spinal canal and neural foramen. L2-3: Disc bulge and bilateral facet hypertrophy. Patent spinal canal and neural foramen. L3-4: Disc bulge with small superimposed left foraminal protrusion/annular fissuring. Ligamentum flavum and bilateral facet hypertrophy. Mild spinal canal and bilateral neural foraminal narrowing. L4-5: Right predominant disc bulge, ligamentum flavum and bilateral facet hypertrophy. Trace bilateral facet effusions. Mild spinal canal and bilateral neural foraminal narrowing. L5-S1: Disc bulge with superimposed central and right foraminal protrusions. Bilateral facet hypertrophy. Patent spinal canal. Mild bilateral neural foraminal narrowing. Paraspinal and other soft tissues: Negative. IMPRESSION: 5 mm right L5 lamina enhancing focus is likely degenerative given proximity to the facet joint. Consider short-term interval follow-up if suspicion persists. Multilevel spondylosis as detailed above. Electronically Signed   By: Primitivo Gauze M.D.   On: 12/11/2019 09:25   CARDIAC CATHETERIZATION  Result Date: 12/29/2019  The left ventricular systolic function is normal.  LV end diastolic pressure is normal.  The left ventricular ejection fraction is 55-65% by visual estimate.  There is moderate aortic valve stenosis.  Mildly elevated right heart pressures with mild pulmonary hypertension with a mean PA pressure of 33 mmHg. Normal LV function with EF estimate approximately 60%. Normal coronary arteries. Moderate aortic valve stenosis with a peak to peak gradient of 20, mean gradient 18.5, and peak instantaneous gradient at 25.8 mmHg; aortic valve area 1.4 cm. RECOMMENDATION: Medical therapy with follow-up with Dr. Johnny Bridge.  Suspect the abnormal nuclear perfusion study is contributed by attenuation artifact in this patient with a BMI of 52.7 kg/m.  No inhalation  she is an outpatient pulmonary consult was conducted by   [facility] Myocar Multi W/Spect W/Wall Motion / EF  Result Date: 12/15/2019  There was no ST segment deviation noted during stress.  Findings consistent with prior large anterior/anterolateral/lateral myocardial infarction with mild to moderate peri-infarct ischemia.  The left ventricular ejection fraction is mildly decreased (46%).  Moderate to high risk based on mild to moderate current ischemia and LVEF 46%. Elevated TID 1.40 may suggest higher cardiac risk.      ASSESSMENT:  1.Adenocarcinoma of left lung ([facility]) -Chemoradiation therapy with carboplatin and paclitaxel from 01/07/2019 through 02/11/2019. -Consolidation immunotherapy with durvalumab from 03/19/2019 through 04/15/2018, held due to pneumonitis. -CT chest on 07/02/2019 shows left upper lobe lung mass measuring 2.6 x 2.0 cm. It shows improvement in size. -MRI of the brain on 07/18/2019 showed left sphenoid wing meningioma measuring 2.6 x 2.0 x 2.6 cm unchanged. No new enhancing intracranial lesion. Increased left temporal white matter edema. -Durvalumab restarted on 07/24/2019. -We reviewed CT chest with contrast from 09/29/2019 which showed stable appearance of treated lung lesion within the posterior left upper lobe. New bandlike area of increased soft tissue density within the anterior to the treated lung lesion favored to be progressive changes from radiation. Local tumor recurrence is less favored. Stable pericardial effusion. -CT chest on 12/10/2019 shows posttreatment appearance of the left lung with associated scarring and volume loss.  No evidence of recurrence or metastatic disease.  Unchanged small pericardial effusion.   PLAN:  1. Clinical stage IIIb (T3N2) adenocarcinoma of the lung: -She was  evaluated by cardiology and a stress test was done.  EF was 46%.  She underwent catheterization which did not show any abnormalities. -No immunotherapy related side effects.   Reviewed labs from today which did not show any abnormalities in the LFTs or CBC. -Proceed with her treatment today and in 2 weeks.  Plan to see her back in 4 weeks for follow-up. -Plan to repeat CT scan in 3 months.  2. Shortness of breath on exertion: -This is improved when she started walking slowly.  3. Radiation pneumonitis: -Prednisone was tapered off in May 2021.  No worsening on subsequent scans.  4. Meningioma: -No headaches or other symptoms at this time.  5.Hypertension: -Continue Norvasc.  6.Bilateral leg swelling: -Continue Lasix as needed.  7. Bilateral knee pains, right more than left: -She had bilateral knee injections by Dr. Para March.  Pain has improved overall.   Orders placed this encounter:  No orders of the defined types were placed in this encounter.    Derek Jack, MD [place] [phone]   I, Milinda Antis, am acting as a scribe for Dr. Sanda Linger.  I, Derek Jack MD, have reviewed the above documentation for accuracy and completeness, and I agree with the above.

## 2020-01-08 NOTE — Patient Instructions (Signed)
[facility] at [facility] Discharge Instructions  Labs drawn from portacath today   Thank you for choosing [facility] at [facility] to provide your oncology and hematology care.  To afford each patient quality time with our provider, please arrive at least 15 minutes before your scheduled appointment time.   If you have a lab appointment with the Cancer Center please come in thru the Main Entrance and check in at the main information desk.  You need to re-schedule your appointment should you arrive 10 or more minutes late.  We strive to give you quality time with our providers, and arriving late affects you and other patients whose appointments are after yours.  Also, if you no show three or more times for appointments you may be dismissed from the clinic at the providers discretion.     Again, thank you for choosing [facility].  Our hope is that these requests will decrease the amount of time that you wait before being seen by our physicians.       _____________________________________________________________  Should you have questions after your visit to [facility], please contact our office at [phone] and follow the prompts.  Our office hours are 8:00 a.m. and 4:30 p.m. Monday - Friday.  Please note that voicemails left after 4:00 p.m. may not be returned until the following business day.  We are closed weekends and major holidays.  You do have access to a nurse 24-7, just call the main number to the clinic [phone] and do not press any options, hold on the line and a nurse will answer the phone.    For prescription refill requests, have your pharmacy contact our office and allow 72 hours.    Due to Covid, you will need to wear a mask upon entering the hospital. If you do not have a mask, a mask will be given to you at the Main Entrance upon arrival. For doctor visits, patients may have 1 support person age 18  or older with them. For treatment visits, patients can not have anyone with them due to social distancing guidelines and our immunocompromised population.     

## 2020-01-08 NOTE — Patient Instructions (Signed)
[facility] at [facility] Discharge Instructions  You were seen today by Dr. Delton Coombes. He went over your recent results. You received your treatment today; continue receiving your treatment every 2 weeks. Dr. Delton Coombes will see you back in 4 weeks for labs and follow up.   Thank you for choosing [facility] at [facility] to provide your oncology and hematology care.  To afford each patient quality time with our provider, please arrive at least 15 minutes before your scheduled appointment time.   If you have a lab appointment with the [facility] please come in thru the Main Entrance and check in at the main information desk  You need to re-schedule your appointment should you arrive 10 or more minutes late.  We strive to give you quality time with our providers, and arriving late affects you and other patients whose appointments are after yours.  Also, if you no show three or more times for appointments you may be dismissed from the clinic at the providers discretion.     Again, thank you for choosing [facility].  Our hope is that these requests will decrease the amount of time that you wait before being seen by our physicians.       _____________________________________________________________  Should you have questions after your visit to [facility], please contact our office at [phone] between the hours of 8:00 a.m. and 4:30 p.m.  Voicemails left after 4:00 p.m. will not be returned until the following business day.  For prescription refill requests, have your pharmacy contact our office and allow 72 hours.    Cancer Center Support Programs:   > Cancer Support Group  2nd Tuesday of the month 1pm-2pm, Journey Room

## 2020-01-08 NOTE — Progress Notes (Signed)
Pt here for D1C16 of imfinzi.  No complaints today.  Labs reviewed with Dr Jill Poling for treatment today.  Tolerated treatment well today without incidence. Vital signs stable prior to discharge.  Discharged in stable condition ambulatory.

## 2020-01-12 DIAGNOSIS — C3412 Malignant neoplasm of upper lobe, left bronchus or lung: Secondary | ICD-10-CM | POA: Diagnosis not present

## 2020-01-12 DIAGNOSIS — R7303 Prediabetes: Secondary | ICD-10-CM | POA: Diagnosis not present

## 2020-01-12 DIAGNOSIS — M159 Polyosteoarthritis, unspecified: Secondary | ICD-10-CM | POA: Diagnosis not present

## 2020-01-12 DIAGNOSIS — M170 Bilateral primary osteoarthritis of knee: Secondary | ICD-10-CM | POA: Diagnosis not present

## 2020-01-22 ENCOUNTER — Other Ambulatory Visit: Payer: Self-pay

## 2020-01-22 ENCOUNTER — Inpatient Hospital Stay (HOSPITAL_COMMUNITY): Payer: Medicare Other | Attending: Hematology

## 2020-01-22 ENCOUNTER — Inpatient Hospital Stay (HOSPITAL_COMMUNITY): Payer: Medicare Other | Attending: Cardiology

## 2020-01-22 VITALS — BP 126/61 | HR 91 | Temp 98.0°F | Resp 18

## 2020-01-22 DIAGNOSIS — Z5112 Encounter for antineoplastic immunotherapy: Secondary | ICD-10-CM | POA: Insufficient documentation

## 2020-01-22 DIAGNOSIS — E119 Type 2 diabetes mellitus without complications: Secondary | ICD-10-CM | POA: Insufficient documentation

## 2020-01-22 DIAGNOSIS — C3492 Malignant neoplasm of unspecified part of left bronchus or lung: Secondary | ICD-10-CM

## 2020-01-22 DIAGNOSIS — C3412 Malignant neoplasm of upper lobe, left bronchus or lung: Secondary | ICD-10-CM | POA: Insufficient documentation

## 2020-01-22 DIAGNOSIS — Z79899 Other long term (current) drug therapy: Secondary | ICD-10-CM | POA: Diagnosis not present

## 2020-01-22 DIAGNOSIS — D329 Benign neoplasm of meninges, unspecified: Secondary | ICD-10-CM | POA: Diagnosis not present

## 2020-01-22 DIAGNOSIS — Z95828 Presence of other vascular implants and grafts: Secondary | ICD-10-CM

## 2020-01-22 DIAGNOSIS — I10 Essential (primary) hypertension: Secondary | ICD-10-CM | POA: Diagnosis not present

## 2020-01-22 LAB — CBC WITH DIFFERENTIAL/PLATELET
Abs Immature Granulocytes: 0.01 10*3/uL (ref 0.00–0.07)
Basophils Absolute: 0 10*3/uL (ref 0.0–0.1)
Basophils Relative: 1 %
Eosinophils Absolute: 0.1 10*3/uL (ref 0.0–0.5)
Eosinophils Relative: 2 %
HCT: 37.9 % (ref 36.0–46.0)
Hemoglobin: 11.5 g/dL — ABNORMAL LOW (ref 12.0–15.0)
Immature Granulocytes: 0 %
Lymphocytes Relative: 9 %
Lymphs Abs: 0.5 10*3/uL — ABNORMAL LOW (ref 0.7–4.0)
MCH: 27 pg (ref 26.0–34.0)
MCHC: 30.3 g/dL (ref 30.0–36.0)
MCV: 89 fL (ref 80.0–100.0)
Monocytes Absolute: 0.3 10*3/uL (ref 0.1–1.0)
Monocytes Relative: 6 %
Neutro Abs: 4.5 10*3/uL (ref 1.7–7.7)
Neutrophils Relative %: 82 %
Platelets: 335 10*3/uL (ref 150–400)
RBC: 4.26 MIL/uL (ref 3.87–5.11)
RDW: 15.7 % — ABNORMAL HIGH (ref 11.5–15.5)
WBC: 5.5 10*3/uL (ref 4.0–10.5)
nRBC: 0 % (ref 0.0–0.2)

## 2020-01-22 LAB — TSH: TSH: 0.765 u[IU]/mL (ref 0.350–4.500)

## 2020-01-22 LAB — COMPREHENSIVE METABOLIC PANEL
ALT: 19 U/L (ref 0–44)
AST: 18 U/L (ref 15–41)
Albumin: 3.6 g/dL (ref 3.5–5.0)
Alkaline Phosphatase: 74 U/L (ref 38–126)
Anion gap: 8 (ref 5–15)
BUN: 14 mg/dL (ref 8–23)
CO2: 25 mmol/L (ref 22–32)
Calcium: 8.8 mg/dL — ABNORMAL LOW (ref 8.9–10.3)
Chloride: 108 mmol/L (ref 98–111)
Creatinine, Ser: 0.73 mg/dL (ref 0.44–1.00)
GFR, Estimated: >60 mL/min (ref >60)
Glucose, Bld: 106 mg/dL — ABNORMAL HIGH (ref 70–99)
Potassium: 3.9 mmol/L (ref 3.5–5.1)
Sodium: 141 mmol/L (ref 135–145)
Total Bilirubin: 0.5 mg/dL (ref 0.3–1.2)
Total Protein: 6.6 g/dL (ref 6.5–8.1)

## 2020-01-22 LAB — LIPID PANEL
Cholesterol: 147 mg/dL (ref 0–200)
HDL: 58 mg/dL (ref >40)
LDL Cholesterol: 72 mg/dL (ref 0–99)
Total CHOL/HDL Ratio: 2.5 RATIO
Triglycerides: 84 mg/dL (ref <150)
VLDL: 17 mg/dL (ref 0–40)

## 2020-01-22 LAB — HEMOGLOBIN A1C
Hgb A1c MFr Bld: 6.2 % — ABNORMAL HIGH (ref 4.8–5.6)
Mean Plasma Glucose: 131.24 mg/dL

## 2020-01-22 MED ORDER — SODIUM CHLORIDE 0.9% FLUSH
10.0000 mL | INTRAVENOUS | Status: DC | PRN
  Administered 2020-01-22: 10 mL

## 2020-01-22 MED ORDER — HEPARIN SOD (PORK) LOCK FLUSH 100 UNIT/ML IV SOLN
500.0000 [IU] | Freq: Once | INTRAVENOUS | Status: AC | PRN
  Administered 2020-01-22: 500 [IU]

## 2020-01-22 MED ORDER — SODIUM CHLORIDE 0.9 % IV SOLN
10.0000 mg/kg | Freq: Once | INTRAVENOUS | Status: AC
  Administered 2020-01-22: 1240 mg via INTRAVENOUS
  Filled 2020-01-22: qty 20

## 2020-01-22 MED ORDER — SODIUM CHLORIDE 0.9 % IV SOLN: Freq: Once | INTRAVENOUS | Status: AC

## 2020-01-22 NOTE — Patient Instructions (Signed)
[facility] Discharge Instructions for Patients Receiving Chemotherapy   Beginning January 23rd 2017 lab work for the Cancer Center will be done in the  Main lab at [facility] on 1st floor. If you have a lab appointment with the Cancer Center please come in thru the  Main Entrance and check in at the main information desk   Today you received the following chemotherapy agents Imfinzi  To help prevent nausea and vomiting after your treatment, we encourage you to take your nausea medication   If you develop nausea and vomiting, or diarrhea that is not controlled by your medication, call the clinic.  The clinic phone number is [phone]. Office hours are Monday-Friday 8:30am-5:00pm.  BELOW ARE SYMPTOMS THAT SHOULD BE REPORTED IMMEDIATELY:  *FEVER GREATER THAN 101.0 F  *CHILLS WITH OR WITHOUT FEVER  NAUSEA AND VOMITING THAT IS NOT CONTROLLED WITH YOUR NAUSEA MEDICATION  *UNUSUAL SHORTNESS OF BREATH  *UNUSUAL BRUISING OR BLEEDING  TENDERNESS IN MOUTH AND THROAT WITH OR WITHOUT PRESENCE OF ULCERS  *URINARY PROBLEMS  *BOWEL PROBLEMS  UNUSUAL RASH Items with * indicate a potential emergency and should be followed up as soon as possible. If you have an emergency after office hours please contact your primary care physician or go to the nearest emergency department.  Please call the clinic during office hours if you have any questions or concerns.   You may also contact the Patient Navigator at [phone] should you have any questions or need assistance in obtaining follow up care.      Resources For Cancer Patients and their Caregivers ? American Cancer Society: Can assist with transportation, wigs, general needs, runs Look Good Feel Better.        1-888-227-6333 ? Cancer Care: Provides financial assistance, online support groups, medication/co-pay assistance.  1-800-813-HOPE (4673) ? [facility] Assists [place] cancer  patients and their families through emotional , educational and financial support.  336-427-4357 ? [place] DSS Where to apply for food stamps, Medicaid and utility assistance. 336-342-1394 ? [facility]: Transportation to medical appointments. 336-347-2287 ? Social Security Administration: May apply for disability if have a Stage IV cancer. 336-342-7796 1-800-772-1213 ? [place] Aging, Disability and Transit Services: Assists with nutrition, care and transit needs. 336-349-2343          

## 2020-01-22 NOTE — Progress Notes (Signed)
Denise [name] presents today for D1C17 Imfinzi. Pt denies any new changes or symptoms since last treatment. Lab results and vitals have been reviewed and are stable and within parameters for treatment. Proceeding with treatment today as planned.  Infusion tolerated without incident or complaint. VSS upon completion of treatment. Port flushed and deaccessed per protocol, see MAR and IV flowsheet for details. Discharged in satisfactory condition with follow up instructions.

## 2020-01-29 NOTE — Progress Notes (Signed)
Cardiology Office Note  Date: 01/29/2020   ID: Denise [name], DOB Dec 07, 1950, MRN [id number]  PCP:  Iona Beard, MD  Cardiologist:  Rozann Lesches, MD Electrophysiologist:  None   Chief Complaint: Follow-up cardiac catheterization   History of Present Illness: Denise [name] is a 69 y.o. female with a history of dyspnea on exertion, coronary artery calcification seen on CT scan, nonrheumatic aortic valve stenosis, stage III left lung adenocarcinoma status post chemoradiation.  Last encounter with Dr. Domenic Polite 12/04/2019 she was experiencing dyspnea on exertion classified as NYHA class II-III.  No chest tightness, palpitations, syncope, wheezing.  Symptom onset after chemoradiation treatment in December 2020.  Stress test was considered moderate to high risk.  Patient underwent cardiac catheterization revealing normal coronary arteries.  Moderate aortic valve stenosis.  She is here for follow-up status post cardiac catheterization. States she has been doing well other than continued chronic dyspnea on exertion. Right radial cath access site clean and dry with 2+ pulses. She denies any palpitations or arrhythmias, orthostatic symptoms i.e. lightheadedness, dizziness, presyncope or near syncopal episode. Denies any anginal symptoms. States she does have significant issues with dyspnea on exertion. She states "I have no symptoms when I'm at rest". States DOE appears to gotten worse gradually. She does have moderate aortic valve stenosis on recent echocardiogram from February 2021. Also complains of chronic lower extremity edema for which she takes Lasix 20 mg as needed. Today's weight is 285. It appears she is maintaining her weight around 283 to 285 lbs. She continues to see pulmonary Dr. Melvyn Novas and Dr. Delton Coombes. She is receiving Imfinzi infusions at [facility] and tolerating well. At her visit with Dr. Melvyn Novas on 10/09/2019 he characterized her dyspnea on exertion as clearly multifactorial  with diastolic dysfunction was aortic stenosis, moderate LAE combined with obesity, deconditioning and related to lung injury. He planned on PFTs to complete the pulmonary evaluation.   Past Medical History:  Diagnosis Date  . Anemia   . Aortic stenosis   . Arthritis   . Cancer ([place])    Lung  . Coronary artery calcification seen on CT scan   . Essential hypertension   . GERD (gastroesophageal reflux disease)   . History of lung cancer    Stage III adenocarcinoma status post chemoradiation  . Port-A-Cath in place 01/06/2019  . Type 2 diabetes mellitus ([place])     Past Surgical History:  Procedure Laterality Date  . CHOLECYSTECTOMY  1997  . COLONOSCOPY    . GASTRIC BYPASS    . INCISIONAL HERNIA REPAIR  04/11/11  . IR IMAGING GUIDED PORT INSERTION  12/27/2018   Right  . LAPAROSCOPIC SALPINGOOPHERECTOMY    . LAPAROTOMY  04/11/2011   Procedure: EXPLORATORY LAPAROTOMY;  Surgeon: Joyice Faster. Cornett, MD;  Location: [facility];  Service: General;  Laterality: N/A;  closure port hole  . RIGHT/LEFT HEART CATH AND CORONARY ANGIOGRAPHY N/A 12/29/2019   Procedure: RIGHT/LEFT HEART CATH AND CORONARY ANGIOGRAPHY;  Surgeon: Troy Sine, MD;  Location: [facility];  Service: Cardiovascular;  Laterality: N/A;  . TOTAL HIP ARTHROPLASTY  03/07/2012   Procedure: TOTAL HIP ARTHROPLASTY ANTERIOR APPROACH;  Surgeon: Mauri Pole, MD;  Location: [facility];  Service: Orthopedics;  Laterality: Right;  . TOTAL SHOULDER ARTHROPLASTY Left 01/21/2015  . TOTAL SHOULDER ARTHROPLASTY Left 01/21/2015   Procedure: LEFT TOTAL SHOULDER ARTHROPLASTY;  Surgeon: Justice Britain, MD;  Location: [facility];  Service: Orthopedics;  Laterality: Left;  Marland Kitchen VAGINAL HYSTERECTOMY  Current Outpatient Medications  Medication Sig Dispense Refill  . Acetaminophen (TYLENOL 8 HOUR ARTHRITIS PAIN PO) Take 650 ng by mouth 2 (two) times daily.     Marland Kitchen amLODipine (NORVASC) 5 MG tablet Take 5 mg by mouth daily.     Marland Kitchen aspirin EC 81 MG tablet  Take 81 mg by mouth daily. Swallow whole.    . Calcium Carbonate Antacid (TUMS PO) Take 4 tablets by mouth daily.     . carboxymethylcellul-glycerin (OPTIVE) 0.5-0.9 % ophthalmic solution Place 1-2 drops into both eyes 3 (three) times daily as needed (dry/irritated eyes.).    Marland Kitchen Cyanocobalamin (VITAMIN B-12) 2500 MCG SUBL Place 2,500 mcg under the tongue every morning.     . diphenhydrAMINE (BENADRYL) 25 MG tablet Take 25 mg by mouth every 6 (six) hours as needed for itching or allergies.     Hunt Oris IV Inject into the vein every 14 (fourteen) days.    . ferrous sulfate 325 (65 FE) MG tablet Take 325 mg by mouth every evening.    . furosemide (LASIX) 20 MG tablet TAKE 1 TABLET (20 MG TOTAL) BY MOUTH AS NEEDED FOR FLUID OR EDEMA (DAILY AS NEEDED FOR SWELLING). 30 tablet 3  . HYDROcodone-acetaminophen (NORCO/VICODIN) 5-325 MG tablet Take 1 tablet by mouth 2 (two) times daily as needed.    . Multiple Vitamin (MULITIVITAMIN WITH MINERALS) TABS Take 1 tablet by mouth daily.     . naproxen (NAPROSYN) 500 MG tablet Take 500 mg by mouth every 12 (twelve) hours.     Marland Kitchen omeprazole (PRILOSEC) 20 MG capsule Take 20 mg by mouth daily.     Current Facility-Administered Medications  Medication Dose Route Frequency Provider Last Rate Last Admin  . sodium chloride flush (NS) 0.9 % injection 3 mL  3 mL Intravenous Q12H Satira Sark, MD       Allergies:  Patient has no known allergies.   Social History: The patient  reports that she quit smoking about 43 years ago. Her smoking use included cigarettes. She has a 12.00 pack-year smoking history. She has never used smokeless tobacco. She reports that she does not drink alcohol and does not use drugs.   Family History: The patient's family history includes Arthritis in her mother; Breast cancer in her mother and sister; COPD in her mother; Diabetes in her father and mother; Heart attack in her brother; Huntington's disease in her maternal grandmother;  Hypertension in her father and mother; Thyroid cancer in her brother.   ROS:  Please see the history of present illness. Otherwise, complete review of systems is positive for none.  All other systems are reviewed and negative.   Physical Exam: VS:  There were no vitals taken for this visit., BMI There is no height or weight on file to calculate BMI.  Wt Readings from Last 3 Encounters:  01/22/20 285 lb 3.2 oz (129.4 kg)  01/08/20 283 lb (128.4 kg)  12/29/19 288 lb (130.6 kg)    General: Morbidly obese patient appears comfortable at rest. Neck: Supple, no elevated JVP or carotid bruits, no thyromegaly. Lungs: Clear to auscultation, nonlabored breathing at rest. Cardiac: Regular rate and rhythm, no S3 2/6 systolic ejection murmur heard most prominently at left and right sternal border, no pericardial rub. Extremities: Mild non-pitting edema, distal pulses 2+. Skin: Warm and dry. Musculoskeletal: No kyphosis. Neuropsychiatric: Alert and oriented x3, affect grossly appropriate.  ECG:  EKG December 04, 2019: Normal sinus rhythm, RBBB, LAFB, bifascicular block, rate of 83, voltage  criteria for LVH  Recent Labwork: 05/01/2019: B Natriuretic Peptide 43.0 05/29/2019: Magnesium 2.1 01/22/2020: ALT 19; AST 18; BUN 14; Creatinine, Ser 0.73; Hemoglobin 11.5; Platelets 335; Potassium 3.9; Sodium 141; TSH 0.765     Component Value Date/Time   CHOL 147 01/22/2020 0912   TRIG 84 01/22/2020 0912   HDL 58 01/22/2020 0912   CHOLHDL 2.5 01/22/2020 0912   VLDL 17 01/22/2020 0912   LDLCALC 72 01/22/2020 0912    Other Studies Reviewed Today:  LHC/ RHC 12/29/2019 RIGHT/LEFT HEART CATH AND CORONARY ANGIOGRAPHY  Conclusion    The left ventricular systolic function is normal.  LV end diastolic pressure is normal.  The left ventricular ejection fraction is 55-65% by visual estimate.  There is moderate aortic valve stenosis.   Mildly elevated right heart pressures with mild pulmonary  hypertension with a mean PA pressure of 33 mmHg.  Normal LV function with EF estimate approximately 60%.  Normal coronary arteries.  Moderate aortic valve stenosis with a peak to peak gradient of 20, mean gradient 18.5, and peak instantaneous gradient at 25.8 mmHg; aortic valve area 1.4 cm.  RECOMMENDATION: Medical therapy with follow-up with Dr. Johnny Bridge.  Suspect the abnormal nuclear perfusion study is contributed by attenuation artifact in this patient with a BMI of 52.7 kg/m.  No inhalation she is an outpatient pulmonary consult was conducted by  Diagnostic Dominance: Right   NST 12/15/2019 Study Result  Narrative & Impression   There was no ST segment deviation noted during stress.  Findings consistent with prior large anterior/anterolateral/lateral myocardial infarction with mild to moderate peri-infarct ischemia.  The left ventricular ejection fraction is mildly decreased (46%).  Moderate to high risk based on mild to moderate current ischemia and LVEF 46%. Elevated TID 1.40 may suggest higher cardiac risk.      Echocardiogram 05/15/2019 1. Left ventricular ejection fraction, by estimation, is 60 to 65%. The left ventricle has normal function. The left ventricle has no regional wall motion abnormalities. There is moderate left ventricular hypertrophy. Left ventricular diastolic parameters are consistent with Grade I diastolic dysfunction (impaired relaxation). Elevated left atrial pressure. 2. Right ventricular systolic function is normal. The right ventricular size is normal. There is normal pulmonary artery systolic pressure. 3. Left atrial size was moderately dilated. 4. The mitral valve is normal in structure and function. No evidence of mitral valve regurgitation. No evidence of mitral stenosis. 5. The aortic valve is tricuspid. Aortic valve regurgitation is mild. Moderate aortic valve stenosis.Aortic valve mean gradient measures 21.0 mmHg. Aortic valve  peak gradient measures 36.2 mmHg. Aortic valve area, by VTI measures 1.27 cm 6. The inferior vena cava is normal in size with greater than 50% respiratory variability, suggesting right atrial pressure of 3 mmHg. 7. Cannot exclude small PFO with left to right shunt.   Assessment and Plan:  1. DOE (dyspnea on exertion)   2. Type 2 diabetes mellitus without complication, without long-term current use of insulin ([place])   3. Essential hypertension   4. Morbid obesity with BMI of 45.0-49.9, adult ([place])   5. Stage III adenocarcinoma of lung, left ([place])    1. DOE (dyspnea on exertion) Recent cardiac catheterization due to increasing dyspnea on exertion and abnormal stress test. Cardiac catheterization showed normal coronary arteries with evidence of moderate aortic stenosis. Patient saw Dr. Melvyn Novas pulmonology in July 2021. He stated her DOE was clearly multifactorial and related to diastolic dysfunction with moderate aortic stenosis, moderate left atrial enlargement since at least February 2021  combined with obesity/deconditioning and lung injury from chemo/radiation for lung adenocarcinoma. He planned on further testing including PFTs  2. Type 2 diabetes mellitus without complication, without long-term current use of insulin ([facility]) Recent hemoglobin A1c on 01/22/2020 was 6.2%. She is following with PCP for diabetes management.  3. Essential hypertension Blood pressure is elevated today at 142/88. She continues on amlodipine 5 mg daily. She states amlodipine seems to have lost its effectiveness. She states she had previously stopped the medication due to low blood pressures but had to restart it. She states now her blood pressure seems to be continuously elevated. Continue amlodipine 5 mg p.o. daily. Start carvedilol 3.125 mg p.o. twice daily.  4. Morbid obesity with BMI of 45.0-49.9, adult ([place]) Current BMI is 52.13. Patient has issues with activity tolerance due to significant DOE when she attempts to  perform any activity more than normal ADLs.  5. Stage III adenocarcinoma of lung, left ([place]) Status post chemo and radiation therapy. Follows with Dr. Delton Coombes. She has been receiving  Imfinzi infusions at [facility] and tolerating well.  6. Moderate aortic stenosis Recent echocardiogram in February 2021 showed normal EF 60 to 65%,Mild LVH, G1 DD, moderate aortic valve stenosis. We'll perform follow-up echocardiogram to assess aortic valve 1 week prior to 63-month follow-up. Educated patient on natural progression of aortic stenosis. Reminded her to be cognizant of symptoms including worsening DOE, feelings of faintness or fainting, or anginal symptoms such as chest pain. She verbalizes understanding  Medication Adjustments/Labs and Tests Ordered: Current medicines are reviewed at length with the patient today.  Concerns regarding medicines are outlined above.   Disposition: Follow-up with Dr. Domenic Polite or APP 6 months  Signed, Levell July, NP 01/29/2020 10:33 PM    [facility] [place], [place], [place] [address] Phone: [phone]; Fax: [phone]

## 2020-01-30 ENCOUNTER — Encounter: Payer: Self-pay | Admitting: Family Medicine

## 2020-01-30 ENCOUNTER — Ambulatory Visit (INDEPENDENT_AMBULATORY_CARE_PROVIDER_SITE_OTHER): Payer: Medicare Other | Admitting: Family Medicine

## 2020-01-30 VITALS — BP 142/88 | HR 90 | Ht 62.0 in | Wt 285.0 lb

## 2020-01-30 DIAGNOSIS — I10 Essential (primary) hypertension: Secondary | ICD-10-CM | POA: Diagnosis not present

## 2020-01-30 DIAGNOSIS — E119 Type 2 diabetes mellitus without complications: Secondary | ICD-10-CM

## 2020-01-30 DIAGNOSIS — Z6841 Body Mass Index (BMI) 40.0 and over, adult: Secondary | ICD-10-CM | POA: Diagnosis not present

## 2020-01-30 DIAGNOSIS — R0609 Other forms of dyspnea: Secondary | ICD-10-CM

## 2020-01-30 DIAGNOSIS — C3492 Malignant neoplasm of unspecified part of left bronchus or lung: Secondary | ICD-10-CM | POA: Diagnosis not present

## 2020-01-30 DIAGNOSIS — I350 Nonrheumatic aortic (valve) stenosis: Secondary | ICD-10-CM | POA: Diagnosis not present

## 2020-01-30 DIAGNOSIS — R0600 Dyspnea, unspecified: Secondary | ICD-10-CM

## 2020-01-30 MED ORDER — CARVEDILOL 3.125 MG PO TABS: 3.1250 mg | ORAL_TABLET | Freq: Two times a day (BID) | ORAL | 6 refills | Status: DC

## 2020-01-30 NOTE — Patient Instructions (Addendum)
Medication Instructions:   Begin Coreg 3.125mg  twice a day.    Continue all other current medications.  Labwork: none  Testing/Procedures: Your physician has requested that you have an echocardiogram. Echocardiography is a painless test that uses sound waves to create images of your heart. It provides your doctor with information about the size and shape of your heart and how well your heart's chambers and valves are working. This procedure takes approximately one hour. There are no restrictions for this procedure - DUE IN 6 MONTHS JUST PRIOR TO NEXT VISIT  Follow-Up: 6 months   Any Other Special Instructions Will Be Listed Below (If Applicable).  If you need a refill on your cardiac medications before your next appointment, please call your pharmacy.

## 2020-02-05 ENCOUNTER — Inpatient Hospital Stay (HOSPITAL_BASED_OUTPATIENT_CLINIC_OR_DEPARTMENT_OTHER): Payer: Medicare Other | Admitting: Hematology

## 2020-02-05 ENCOUNTER — Inpatient Hospital Stay (HOSPITAL_COMMUNITY): Payer: Medicare Other

## 2020-02-05 ENCOUNTER — Other Ambulatory Visit: Payer: Self-pay

## 2020-02-05 VITALS — BP 147/76 | HR 77 | Temp 98.0°F | Resp 20 | Wt 281.5 lb

## 2020-02-05 VITALS — BP 135/51 | HR 77 | Temp 97.8°F | Resp 20

## 2020-02-05 DIAGNOSIS — Z95828 Presence of other vascular implants and grafts: Secondary | ICD-10-CM

## 2020-02-05 DIAGNOSIS — M25562 Pain in left knee: Secondary | ICD-10-CM | POA: Diagnosis not present

## 2020-02-05 DIAGNOSIS — D329 Benign neoplasm of meninges, unspecified: Secondary | ICD-10-CM | POA: Diagnosis not present

## 2020-02-05 DIAGNOSIS — M170 Bilateral primary osteoarthritis of knee: Secondary | ICD-10-CM | POA: Diagnosis not present

## 2020-02-05 DIAGNOSIS — I10 Essential (primary) hypertension: Secondary | ICD-10-CM | POA: Diagnosis not present

## 2020-02-05 DIAGNOSIS — M25361 Other instability, right knee: Secondary | ICD-10-CM | POA: Diagnosis not present

## 2020-02-05 DIAGNOSIS — C3492 Malignant neoplasm of unspecified part of left bronchus or lung: Secondary | ICD-10-CM | POA: Diagnosis not present

## 2020-02-05 DIAGNOSIS — M21161 Varus deformity, not elsewhere classified, right knee: Secondary | ICD-10-CM | POA: Diagnosis not present

## 2020-02-05 DIAGNOSIS — M25461 Effusion, right knee: Secondary | ICD-10-CM | POA: Diagnosis not present

## 2020-02-05 DIAGNOSIS — M25362 Other instability, left knee: Secondary | ICD-10-CM | POA: Diagnosis not present

## 2020-02-05 DIAGNOSIS — M25561 Pain in right knee: Secondary | ICD-10-CM | POA: Diagnosis not present

## 2020-02-05 DIAGNOSIS — Z79899 Other long term (current) drug therapy: Secondary | ICD-10-CM | POA: Diagnosis not present

## 2020-02-05 DIAGNOSIS — Z5112 Encounter for antineoplastic immunotherapy: Secondary | ICD-10-CM | POA: Diagnosis not present

## 2020-02-05 DIAGNOSIS — E119 Type 2 diabetes mellitus without complications: Secondary | ICD-10-CM | POA: Diagnosis not present

## 2020-02-05 DIAGNOSIS — C3412 Malignant neoplasm of upper lobe, left bronchus or lung: Secondary | ICD-10-CM | POA: Diagnosis not present

## 2020-02-05 DIAGNOSIS — M21162 Varus deformity, not elsewhere classified, left knee: Secondary | ICD-10-CM | POA: Diagnosis not present

## 2020-02-05 LAB — CBC WITH DIFFERENTIAL/PLATELET
Abs Immature Granulocytes: 0.01 10*3/uL (ref 0.00–0.07)
Basophils Absolute: 0 10*3/uL (ref 0.0–0.1)
Basophils Relative: 1 %
Eosinophils Absolute: 0.1 10*3/uL (ref 0.0–0.5)
Eosinophils Relative: 3 %
HCT: 37.8 % (ref 36.0–46.0)
Hemoglobin: 11.5 g/dL — ABNORMAL LOW (ref 12.0–15.0)
Immature Granulocytes: 0 %
Lymphocytes Relative: 14 %
Lymphs Abs: 0.6 10*3/uL — ABNORMAL LOW (ref 0.7–4.0)
MCH: 26.9 pg (ref 26.0–34.0)
MCHC: 30.4 g/dL (ref 30.0–36.0)
MCV: 88.5 fL (ref 80.0–100.0)
Monocytes Absolute: 0.4 10*3/uL (ref 0.1–1.0)
Monocytes Relative: 8 %
Neutro Abs: 3.5 10*3/uL (ref 1.7–7.7)
Neutrophils Relative %: 74 %
Platelets: 340 10*3/uL (ref 150–400)
RBC: 4.27 MIL/uL (ref 3.87–5.11)
RDW: 15.2 % (ref 11.5–15.5)
WBC: 4.7 10*3/uL (ref 4.0–10.5)
nRBC: 0 % (ref 0.0–0.2)

## 2020-02-05 LAB — COMPREHENSIVE METABOLIC PANEL
ALT: 19 U/L (ref 0–44)
AST: 18 U/L (ref 15–41)
Albumin: 3.5 g/dL (ref 3.5–5.0)
Alkaline Phosphatase: 76 U/L (ref 38–126)
Anion gap: 6 (ref 5–15)
BUN: 12 mg/dL (ref 8–23)
CO2: 26 mmol/L (ref 22–32)
Calcium: 8.9 mg/dL (ref 8.9–10.3)
Chloride: 107 mmol/L (ref 98–111)
Creatinine, Ser: 0.67 mg/dL (ref 0.44–1.00)
GFR, Estimated: >60 mL/min (ref >60)
Glucose, Bld: 104 mg/dL — ABNORMAL HIGH (ref 70–99)
Potassium: 3.9 mmol/L (ref 3.5–5.1)
Sodium: 139 mmol/L (ref 135–145)
Total Bilirubin: 0.5 mg/dL (ref 0.3–1.2)
Total Protein: 6.5 g/dL (ref 6.5–8.1)

## 2020-02-05 MED ORDER — HEPARIN SOD (PORK) LOCK FLUSH 100 UNIT/ML IV SOLN
500.0000 [IU] | Freq: Once | INTRAVENOUS | Status: AC | PRN
  Administered 2020-02-05: 500 [IU]

## 2020-02-05 MED ORDER — SODIUM CHLORIDE 0.9% FLUSH
10.0000 mL | INTRAVENOUS | Status: DC | PRN
  Administered 2020-02-05: 10 mL

## 2020-02-05 MED ORDER — SODIUM CHLORIDE 0.9 % IV SOLN: Freq: Once | INTRAVENOUS | Status: AC

## 2020-02-05 MED ORDER — SODIUM CHLORIDE 0.9 % IV SOLN
10.0000 mg/kg | Freq: Once | INTRAVENOUS | Status: AC
  Administered 2020-02-05: 1240 mg via INTRAVENOUS
  Filled 2020-02-05: qty 20

## 2020-02-05 NOTE — Patient Instructions (Signed)
[facility] Discharge Instructions for Patients Receiving Chemotherapy  Today you received the following chemotherapy agents   To help prevent nausea and vomiting after your treatment, we encourage you to take your nausea medication   If you develop nausea and vomiting that is not controlled by your nausea medication, call the clinic.   BELOW ARE SYMPTOMS THAT SHOULD BE REPORTED IMMEDIATELY:  *FEVER GREATER THAN 100.5 F  *CHILLS WITH OR WITHOUT FEVER  NAUSEA AND VOMITING THAT IS NOT CONTROLLED WITH YOUR NAUSEA MEDICATION  *UNUSUAL SHORTNESS OF BREATH  *UNUSUAL BRUISING OR BLEEDING  TENDERNESS IN MOUTH AND THROAT WITH OR WITHOUT PRESENCE OF ULCERS  *URINARY PROBLEMS  *BOWEL PROBLEMS  UNUSUAL RASH Items with * indicate a potential emergency and should be followed up as soon as possible.  Feel free to call the clinic should you have any questions or concerns. The clinic phone number is [phone].  Please show the CHEMO ALERT CARD at check-in to the Emergency Department and triage nurse.   

## 2020-02-05 NOTE — Progress Notes (Signed)
Pt here for D1C18 of imfinzi.  Labs reviewed with Dr Raliegh Ip, labs WNL for treatment. Okay to proceed with treatment today.  Pt tolerated infusion well today.  Vital signs stable prior to discharge.  Discharged ambulatory in stable condition.

## 2020-02-05 NOTE — Progress Notes (Signed)
[place], [place] [address]   CLINIC:  Medical Oncology/Hematology  PCP:  Iona Beard, [address] / [place] [address] [phone]   REASON FOR VISIT:  Follow-up for stage III left lung adenocarcinoma  PRIOR THERAPY:  1. Chemoradiation with carboplatin and paclitaxel from 01/07/2019 to 02/11/2019. 2. Consolidation with durvalumab from 03/19/2019 to 04/16/2019, held due to pneumonitis.  NGS Results: Not done  CURRENT THERAPY: Consolidation with durvalumab every 2 weeks  BRIEF ONCOLOGIC HISTORY:  Oncology History  Adenocarcinoma of left lung ([place])  12/09/2018 Initial Diagnosis   Adenocarcinoma of left lung ([place])   01/02/2019 Cancer Staging   Staging form: Lung, AJCC 8th Edition - Clinical stage from 01/02/2019: Stage IIIB (cT3, cN2, cM0) - Signed by Derek Jack, MD on 01/02/2019   01/07/2019 - 02/11/2019 Chemotherapy   The patient had palonosetron (ALOXI) injection 0.25 mg, 0.25 mg, Intravenous,  Once, 6 of 6 cycles Administration: 0.25 mg (01/07/2019), 0.25 mg (01/14/2019), 0.25 mg (01/21/2019), 0.25 mg (01/28/2019), 0.25 mg (02/04/2019), 0.25 mg (02/11/2019) CARBOplatin (PARAPLATIN) 270 mg in sodium chloride 0.9 % 250 mL chemo infusion, 270 mg (100 % of original dose 266.4 mg), Intravenous,  Once, 6 of 6 cycles Dose modification:   (original dose 266.4 mg, Cycle 1),   (original dose 266.4 mg, Cycle 2),   (original dose 266.4 mg, Cycle 3),   (original dose 266.4 mg, Cycle 4) Administration: 270 mg (01/07/2019), 270 mg (01/14/2019), 270 mg (01/21/2019), 270 mg (01/28/2019), 270 mg (02/04/2019), 270 mg (02/11/2019) PACLitaxel (TAXOL) 108 mg in sodium chloride 0.9 % 250 mL chemo infusion (</= 80mg /m2), 45 mg/m2 = 108 mg, Intravenous,  Once, 6 of 6 cycles Administration: 108 mg (01/07/2019), 108 mg (01/14/2019), 108 mg (01/21/2019), 108 mg (01/28/2019), 108 mg (02/04/2019), 108 mg (02/11/2019) fosaprepitant (EMEND) 150  mg, dexamethasone (DECADRON) 12 mg in sodium chloride 0.9 % 145 mL IVPB, , Intravenous,  Once, 5 of 5 cycles Administration:  (01/14/2019),  (01/21/2019),  (01/28/2019),  (02/04/2019),  (02/11/2019)  for chemotherapy treatment.    03/19/2019 -  Chemotherapy   The patient had durvalumab (IMFINZI) 1,240 mg in sodium chloride 0.9 % 100 mL chemo infusion, 9.9 mg/kg = 1,260 mg, Intravenous,  Once, 17 of 21 cycles Administration: 1,240 mg (03/19/2019), 1,240 mg (04/02/2019), 1,240 mg (04/16/2019), 1,240 mg (07/24/2019), 1,240 mg (08/07/2019), 1,240 mg (08/21/2019), 1,240 mg (09/04/2019), 1,240 mg (09/19/2019), 1,240 mg (10/02/2019), 1,240 mg (10/17/2019), 1,240 mg (10/31/2019), 1,240 mg (11/13/2019), 1,240 mg (11/27/2019), 1,240 mg (12/11/2019), 1,240 mg (12/26/2019), 1,240 mg (01/08/2020), 1,240 mg (01/22/2020)  for chemotherapy treatment.      CANCER STAGING: Cancer Staging Adenocarcinoma of left lung [facility]) Staging form: Lung, AJCC 8th Edition - Clinical stage from 01/02/2019: Stage IIIB (cT3, cN2, cM0) - Signed by Derek Jack, MD on 01/02/2019   INTERVAL HISTORY:  Denise [name], a 69 y.o. female, returns for routine follow-up and consideration for next cycle of immunotherapy. Denise [name] was last seen on 01/08/2020.  Due for cycle #18 of durvalumab today.   Overall, she tells me she has been feeling pretty well. She complains of SOB with exertion and ongoing pain in her right knee from arthritis. She received steroid injections in both knees and it helped her left knee, but it did not alleviate her right knee pain. Her cough and SOB are stable. Her appetite is excellent and she denies N/V/D.  Overall, she feels ready for next cycle of immunotherapy today.  REVIEW OF SYSTEMS:  Review of Systems  Constitutional: Positive for fatigue (75%). Negative for appetite change.  Respiratory: Positive for cough (stable) and shortness of breath (w/ exertion).   Gastrointestinal: Negative for diarrhea, nausea and  vomiting.  Musculoskeletal: Positive for arthralgias (6/10 R knee pain).  All other systems reviewed and are negative.   PAST MEDICAL/SURGICAL HISTORY:  Past Medical History:  Diagnosis Date  . Anemia   . Aortic stenosis   . Arthritis   . Cancer ([facility])    Lung  . Coronary artery calcification seen on CT scan   . Essential hypertension   . GERD (gastroesophageal reflux disease)   . History of lung cancer    Stage III adenocarcinoma status post chemoradiation  . Port-A-Cath in place 01/06/2019  . Type 2 diabetes mellitus ([place])    Past Surgical History:  Procedure Laterality Date  . CHOLECYSTECTOMY  1997  . COLONOSCOPY    . GASTRIC BYPASS    . INCISIONAL HERNIA REPAIR  04/11/11  . IR IMAGING GUIDED PORT INSERTION  12/27/2018   Right  . LAPAROSCOPIC SALPINGOOPHERECTOMY    . LAPAROTOMY  04/11/2011   Procedure: EXPLORATORY LAPAROTOMY;  Surgeon: Joyice Faster. Cornett, MD;  Location: [facility];  Service: General;  Laterality: N/A;  closure port hole  . RIGHT/LEFT HEART CATH AND CORONARY ANGIOGRAPHY N/A 12/29/2019   Procedure: RIGHT/LEFT HEART CATH AND CORONARY ANGIOGRAPHY;  Surgeon: Troy Sine, MD;  Location: [facility];  Service: Cardiovascular;  Laterality: N/A;  . TOTAL HIP ARTHROPLASTY  03/07/2012   Procedure: TOTAL HIP ARTHROPLASTY ANTERIOR APPROACH;  Surgeon: Mauri Pole, MD;  Location: [facility];  Service: Orthopedics;  Laterality: Right;  . TOTAL SHOULDER ARTHROPLASTY Left 01/21/2015  . TOTAL SHOULDER ARTHROPLASTY Left 01/21/2015   Procedure: LEFT TOTAL SHOULDER ARTHROPLASTY;  Surgeon: Justice Britain, MD;  Location: [facility];  Service: Orthopedics;  Laterality: Left;  Marland Kitchen VAGINAL HYSTERECTOMY      SOCIAL HISTORY:  Social History   Socioeconomic History  . Marital status: Single    Spouse name: Not on file  . Number of children: Not on file  . Years of education: 12th grade  . Highest education level: Not on file  Occupational History  . Occupation: Employed    Employer:  [employer]  Tobacco Use  . Smoking status: Former Smoker    Packs/day: 1.00    Years: 12.00    Pack years: 12.00    Types: Cigarettes    Quit date: 03/20/1976    Years since quitting: 43.9  . Smokeless tobacco: Never Used  Vaping Use  . Vaping Use: Never used  Substance and Sexual Activity  . Alcohol use: No  . Drug use: No  . Sexual activity: Not on file  Other Topics Concern  . Not on file  Social History Narrative  . Not on file   Social Determinants of Health   Financial Resource Strain:   . Difficulty of Paying Living Expenses: Not on file  Food Insecurity:   . Worried About Charity fundraiser in the Last Year: Not on file  . Ran Out of Food in the Last Year: Not on file  Transportation Needs:   . Lack of Transportation (Medical): Not on file  . Lack of Transportation (Non-Medical): Not on file  Physical Activity:   . Days of Exercise per Week: Not on file  . Minutes of Exercise per Session: Not on file  Stress:   . Feeling of Stress :  Not on file  Social Connections:   . Frequency of Communication with Friends and Family: Not on file  . Frequency of Social Gatherings with Friends and Family: Not on file  . Attends Religious Services: Not on file  . Active Member of Clubs or Organizations: Not on file  . Attends Archivist Meetings: Not on file  . Marital Status: Not on file  Intimate Partner Violence:   . Fear of Current or Ex-Partner: Not on file  . Emotionally Abused: Not on file  . Physically Abused: Not on file  . Sexually Abused: Not on file    FAMILY HISTORY:  Family History  Problem Relation Age of Onset  . Breast cancer Mother   . COPD Mother   . Arthritis Mother   . Diabetes Mother   . Hypertension Mother   . Hypertension Father   . Diabetes Father   . Breast cancer Sister   . Thyroid cancer Brother   . Huntington's disease Maternal Grandmother   . Heart attack Brother     CURRENT MEDICATIONS:  Current Outpatient  Medications  Medication Sig Dispense Refill  . Acetaminophen (TYLENOL 8 HOUR ARTHRITIS PAIN PO) Take 650 ng by mouth 2 (two) times daily.     Marland Kitchen albuterol (VENTOLIN HFA) 108 (90 Base) MCG/ACT inhaler Inhale into the lungs.    Marland Kitchen amLODipine (NORVASC) 5 MG tablet Take 5 mg by mouth daily.     Marland Kitchen aspirin EC 81 MG tablet Take 81 mg by mouth daily. Swallow whole.    . Calcium Carbonate Antacid (TUMS PO) Take 4 tablets by mouth daily.     . carboxymethylcellul-glycerin (OPTIVE) 0.5-0.9 % ophthalmic solution Place 1-2 drops into both eyes 3 (three) times daily as needed (dry/irritated eyes.).    Marland Kitchen carvedilol (COREG) 3.125 MG tablet Take 1 tablet (3.125 mg total) by mouth 2 (two) times daily. 60 tablet 6  . Cyanocobalamin (VITAMIN B-12) 2500 MCG SUBL Place 2,500 mcg under the tongue every morning.     . diphenhydrAMINE (BENADRYL) 25 MG tablet Take 25 mg by mouth every 6 (six) hours as needed for itching or allergies.     Hunt Oris IV Inject into the vein every 14 (fourteen) days.    . ferrous sulfate 325 (65 FE) MG tablet Take 325 mg by mouth every evening.    . furosemide (LASIX) 20 MG tablet TAKE 1 TABLET (20 MG TOTAL) BY MOUTH AS NEEDED FOR FLUID OR EDEMA (DAILY AS NEEDED FOR SWELLING). 30 tablet 3  . HYDROcodone-acetaminophen (NORCO/VICODIN) 5-325 MG tablet Take 1 tablet by mouth 2 (two) times daily as needed.    . Multiple Vitamin (MULITIVITAMIN WITH MINERALS) TABS Take 1 tablet by mouth daily.     . naproxen (NAPROSYN) 500 MG tablet Take 500 mg by mouth every 12 (twelve) hours.     Marland Kitchen omeprazole (PRILOSEC) 20 MG capsule Take 20 mg by mouth daily.     Current Facility-Administered Medications  Medication Dose Route Frequency Provider Last Rate Last Admin  . sodium chloride flush (NS) 0.9 % injection 3 mL  3 mL Intravenous Q12H Satira Sark, MD        ALLERGIES:  No Known Allergies  PHYSICAL EXAM:  Performance status (ECOG): 1 - Symptomatic but completely ambulatory  Vitals:    02/05/20 0925  BP: (!) 147/76  Pulse: 77  Resp: 20  Temp: 98 F (36.7 C)  SpO2: 97%   Wt Readings from Last 3 Encounters:  02/05/20 281 lb  8 oz (127.7 kg)  01/30/20 285 lb (129.3 kg)  01/22/20 285 lb 3.2 oz (129.4 kg)   Physical Exam Vitals reviewed.  Constitutional:      Appearance: Normal appearance. She is obese.  Cardiovascular:     Rate and Rhythm: Normal rate and regular rhythm.     Pulses: Normal pulses.     Heart sounds: Normal heart sounds.  Pulmonary:     Effort: Pulmonary effort is normal.     Breath sounds: Normal breath sounds.  Musculoskeletal:     Right lower leg: No edema.     Left lower leg: No edema.  Neurological:     General: No focal deficit present.     Mental Status: She is alert and oriented to person, place, and time.  Psychiatric:        Mood and Affect: Mood normal.        Behavior: Behavior normal.     LABORATORY DATA:  I have reviewed the labs as listed.  CBC Latest Ref Rng & Units 02/05/2020 01/22/2020 01/08/2020  WBC 4.0 - 10.5 K/uL 4.7 5.5 4.9  Hemoglobin 12.0 - 15.0 g/dL 11.5(L) 11.5(L) 11.9(L)  Hematocrit 36 - 46 % 37.8 37.9 37.6  Platelets 150 - 400 K/uL 340 335 376   CMP Latest Ref Rng & Units 02/05/2020 01/22/2020 01/08/2020  Glucose 70 - 99 mg/dL 104(H) 106(H) 106(H)  BUN 8 - 23 mg/dL 12 14 15   Creatinine 0.44 - 1.00 mg/dL 0.67 0.73 0.79  Sodium 135 - 145 mmol/L 139 141 143  Potassium 3.5 - 5.1 mmol/L 3.9 3.9 3.9  Chloride 98 - 111 mmol/L 107 108 106  CO2 22 - 32 mmol/L 26 25 25   Calcium 8.9 - 10.3 mg/dL 8.9 8.8(L) 9.1  Total Protein 6.5 - 8.1 g/dL 6.5 6.6 6.7  Total Bilirubin 0.3 - 1.2 mg/dL 0.5 0.5 0.4  Alkaline Phos 38 - 126 U/L 76 74 74  AST 15 - 41 U/L 18 18 21   ALT 0 - 44 U/L 19 19 19     DIAGNOSTIC IMAGING:  I have independently reviewed the scans and discussed with the patient. No results found.   ASSESSMENT:  1.Adenocarcinoma of left lung ([facility]) -Chemoradiation therapy with carboplatin and paclitaxel from  01/07/2019 through 02/11/2019. -Consolidation immunotherapy with durvalumab from 03/19/2019 through 04/15/2018, held due to pneumonitis. -CT chest on 07/02/2019 shows left upper lobe lung mass measuring 2.6 x 2.0 cm. It shows improvement in size. -MRI of the brain on 07/18/2019 showed left sphenoid wing meningioma measuring 2.6 x 2.0 x 2.6 cm unchanged. No new enhancing intracranial lesion. Increased left temporal white matter edema. -Durvalumab restarted on 07/24/2019. -We reviewed CT chest with contrast from 09/29/2019 which showed stable appearance of treated lung lesion within the posterior left upper lobe. New bandlike area of increased soft tissue density within the anterior to the treated lung lesion favored to be progressive changes from radiation. Local tumor recurrence is less favored. Stable pericardial effusion. -CT chest on 12/10/2019 shows posttreatment appearance of the left lung with associated scarring and volume loss.  No evidence of recurrence or metastatic disease.  Unchanged small pericardial effusion. -She was evaluated by cardiology with a stress test.  EF was 46%.  She underwent cardiac catheterization which did not show any abnormalities.   PLAN:  1. Clinical stage IIIb (T3N2) adenocarcinoma of the lung: -She does not report any immunotherapy related side effects after last treatment. -Baseline shortness of breath on exertion is stable. -Reviewed labs which showed  normal chemistries and CBC.  TSH was 1.26. -She will proceed with her treatment today and in 2 weeks.  RTC 4 weeks with repeat labs.  I will also plan to repeat CT scan of the chest with contrast.  2. Shortness of breath on exertion: -This has improved when she started walking slowly as suggested by pulmonary. -Continue follow-up with pulmonary.  3. Radiation pneumonitis: -Prednisone was tapered off in May 2021.  No worsening on subsequent scans.  No worsening shortness of breath.  4. Meningioma: -No  headaches or other symptoms at this time.  5.Hypertension: -Continue Norvasc.  Blood pressure is well controlled.  6.Bilateral leg swelling: -Continue Lasix as needed.  7. Bilateral knee pains, right more than left: -She had bilateral knee injections.  She still has right knee pain.   Orders placed this encounter:  No orders of the defined types were placed in this encounter.    Derek Jack, MD [place] [phone]   I, Milinda Antis, am acting as a scribe for Dr. Sanda Linger.  I, Derek Jack MD, have reviewed the above documentation for accuracy and completeness, and I agree with the above.

## 2020-02-05 NOTE — Patient Instructions (Signed)
[place] at [facility] Discharge Instructions  You were seen today by Dr. Delton Coombes. He went over your recent results. You received your treatment today; continue receiving it every 2 weeks. You will be scheduled for a CT scan of your chest before your next visit. Dr. Delton Coombes will see you back in 4 weeks for labs and follow up.   Thank you for choosing [place] at [facility] to provide your oncology and hematology care.  To afford each patient quality time with our provider, please arrive at least 15 minutes before your scheduled appointment time.   If you have a lab appointment with the [facility] please come in thru the Main Entrance and check in at the main information desk  You need to re-schedule your appointment should you arrive 10 or more minutes late.  We strive to give you quality time with our providers, and arriving late affects you and other patients whose appointments are after yours.  Also, if you no show three or more times for appointments you may be dismissed from the clinic at the providers discretion.     Again, thank you for choosing [facility].  Our hope is that these requests will decrease the amount of time that you wait before being seen by our physicians.       _____________________________________________________________  Should you have questions after your visit to [facility], please contact our office at [phone] between the hours of 8:00 a.m. and 4:30 p.m.  Voicemails left after 4:00 p.m. will not be returned until the following business day.  For prescription refill requests, have your pharmacy contact our office and allow 72 hours.    Cancer Center Support Programs:   > Cancer Support Group  2nd Tuesday of the month 1pm-2pm, Journey Room

## 2020-02-05 NOTE — Progress Notes (Signed)
Patient assessed and labs reviewed by Dr. Delton Coombes. Okay to proceed with treatment today. Pharmacy and Primary RN aware.

## 2020-02-06 LAB — TSH: TSH: 1.266 u[IU]/mL (ref 0.350–4.500)

## 2020-02-08 ENCOUNTER — Other Ambulatory Visit (HOSPITAL_COMMUNITY): Payer: Self-pay | Admitting: Hematology

## 2020-02-11 ENCOUNTER — Other Ambulatory Visit (HOSPITAL_COMMUNITY): Payer: Self-pay | Admitting: *Deleted

## 2020-02-11 DIAGNOSIS — M25562 Pain in left knee: Secondary | ICD-10-CM | POA: Diagnosis not present

## 2020-02-11 DIAGNOSIS — M25561 Pain in right knee: Secondary | ICD-10-CM | POA: Diagnosis not present

## 2020-02-11 DIAGNOSIS — M170 Bilateral primary osteoarthritis of knee: Secondary | ICD-10-CM | POA: Diagnosis not present

## 2020-02-19 DIAGNOSIS — M25562 Pain in left knee: Secondary | ICD-10-CM | POA: Diagnosis not present

## 2020-02-19 DIAGNOSIS — M25561 Pain in right knee: Secondary | ICD-10-CM | POA: Diagnosis not present

## 2020-02-19 DIAGNOSIS — M170 Bilateral primary osteoarthritis of knee: Secondary | ICD-10-CM | POA: Diagnosis not present

## 2020-02-20 ENCOUNTER — Other Ambulatory Visit: Payer: Self-pay

## 2020-02-20 ENCOUNTER — Inpatient Hospital Stay (HOSPITAL_COMMUNITY): Payer: Medicare Other

## 2020-02-20 ENCOUNTER — Inpatient Hospital Stay (HOSPITAL_COMMUNITY): Payer: Medicare Other | Attending: Hematology

## 2020-02-20 VITALS — BP 139/61 | HR 89 | Temp 96.9°F | Resp 19

## 2020-02-20 DIAGNOSIS — M7989 Other specified soft tissue disorders: Secondary | ICD-10-CM | POA: Diagnosis not present

## 2020-02-20 DIAGNOSIS — Z95828 Presence of other vascular implants and grafts: Secondary | ICD-10-CM

## 2020-02-20 DIAGNOSIS — Z9221 Personal history of antineoplastic chemotherapy: Secondary | ICD-10-CM | POA: Insufficient documentation

## 2020-02-20 DIAGNOSIS — Z87891 Personal history of nicotine dependence: Secondary | ICD-10-CM | POA: Insufficient documentation

## 2020-02-20 DIAGNOSIS — Z923 Personal history of irradiation: Secondary | ICD-10-CM | POA: Insufficient documentation

## 2020-02-20 DIAGNOSIS — I10 Essential (primary) hypertension: Secondary | ICD-10-CM | POA: Insufficient documentation

## 2020-02-20 DIAGNOSIS — R0602 Shortness of breath: Secondary | ICD-10-CM | POA: Diagnosis not present

## 2020-02-20 DIAGNOSIS — M25561 Pain in right knee: Secondary | ICD-10-CM | POA: Diagnosis not present

## 2020-02-20 DIAGNOSIS — C3412 Malignant neoplasm of upper lobe, left bronchus or lung: Secondary | ICD-10-CM | POA: Insufficient documentation

## 2020-02-20 DIAGNOSIS — C3492 Malignant neoplasm of unspecified part of left bronchus or lung: Secondary | ICD-10-CM

## 2020-02-20 DIAGNOSIS — D320 Benign neoplasm of cerebral meninges: Secondary | ICD-10-CM | POA: Insufficient documentation

## 2020-02-20 DIAGNOSIS — Z5112 Encounter for antineoplastic immunotherapy: Secondary | ICD-10-CM | POA: Diagnosis not present

## 2020-02-20 DIAGNOSIS — Z79899 Other long term (current) drug therapy: Secondary | ICD-10-CM | POA: Insufficient documentation

## 2020-02-20 LAB — COMPREHENSIVE METABOLIC PANEL
ALT: 16 U/L (ref 0–44)
AST: 15 U/L (ref 15–41)
Albumin: 3.5 g/dL (ref 3.5–5.0)
Alkaline Phosphatase: 63 U/L (ref 38–126)
Anion gap: 9 (ref 5–15)
BUN: 13 mg/dL (ref 8–23)
CO2: 26 mmol/L (ref 22–32)
Calcium: 8.8 mg/dL — ABNORMAL LOW (ref 8.9–10.3)
Chloride: 106 mmol/L (ref 98–111)
Creatinine, Ser: 0.67 mg/dL (ref 0.44–1.00)
GFR, Estimated: >60 mL/min (ref >60)
Glucose, Bld: 107 mg/dL — ABNORMAL HIGH (ref 70–99)
Potassium: 3.5 mmol/L (ref 3.5–5.1)
Sodium: 141 mmol/L (ref 135–145)
Total Bilirubin: 0.5 mg/dL (ref 0.3–1.2)
Total Protein: 6.3 g/dL — ABNORMAL LOW (ref 6.5–8.1)

## 2020-02-20 LAB — CBC WITH DIFFERENTIAL/PLATELET
Abs Immature Granulocytes: 0.01 10*3/uL (ref 0.00–0.07)
Basophils Absolute: 0 10*3/uL (ref 0.0–0.1)
Basophils Relative: 1 %
Eosinophils Absolute: 0.1 10*3/uL (ref 0.0–0.5)
Eosinophils Relative: 3 %
HCT: 35.8 % — ABNORMAL LOW (ref 36.0–46.0)
Hemoglobin: 11.2 g/dL — ABNORMAL LOW (ref 12.0–15.0)
Immature Granulocytes: 0 %
Lymphocytes Relative: 14 %
Lymphs Abs: 0.6 10*3/uL — ABNORMAL LOW (ref 0.7–4.0)
MCH: 27.6 pg (ref 26.0–34.0)
MCHC: 31.3 g/dL (ref 30.0–36.0)
MCV: 88.2 fL (ref 80.0–100.0)
Monocytes Absolute: 0.4 10*3/uL (ref 0.1–1.0)
Monocytes Relative: 8 %
Neutro Abs: 3.3 10*3/uL (ref 1.7–7.7)
Neutrophils Relative %: 74 %
Platelets: 310 10*3/uL (ref 150–400)
RBC: 4.06 MIL/uL (ref 3.87–5.11)
RDW: 14.7 % (ref 11.5–15.5)
WBC: 4.5 10*3/uL (ref 4.0–10.5)
nRBC: 0 % (ref 0.0–0.2)

## 2020-02-20 MED ORDER — HEPARIN SOD (PORK) LOCK FLUSH 100 UNIT/ML IV SOLN
500.0000 [IU] | Freq: Once | INTRAVENOUS | Status: AC | PRN
  Administered 2020-02-20: 500 [IU]

## 2020-02-20 MED ORDER — SODIUM CHLORIDE 0.9% FLUSH
10.0000 mL | INTRAVENOUS | Status: DC | PRN
  Administered 2020-02-20: 10 mL

## 2020-02-20 MED ORDER — SODIUM CHLORIDE 0.9 % IV SOLN: Freq: Once | INTRAVENOUS | Status: AC

## 2020-02-20 MED ORDER — SODIUM CHLORIDE 0.9 % IV SOLN
10.0000 mg/kg | Freq: Once | INTRAVENOUS | Status: AC
  Administered 2020-02-20: 1240 mg via INTRAVENOUS
  Filled 2020-02-20: qty 20

## 2020-02-20 NOTE — Patient Instructions (Signed)
[facility] Discharge Instructions for Patients Receiving Chemotherapy  Today you received the following chemotherapy agents Imfinzi  To help prevent nausea and vomiting after your treatment, we encourage you to take your nausea medication  If you develop nausea and vomiting that is not controlled by your nausea medication, call the clinic.   BELOW ARE SYMPTOMS THAT SHOULD BE REPORTED IMMEDIATELY:  *FEVER GREATER THAN 100.5 F  *CHILLS WITH OR WITHOUT FEVER  NAUSEA AND VOMITING THAT IS NOT CONTROLLED WITH YOUR NAUSEA MEDICATION  *UNUSUAL SHORTNESS OF BREATH  *UNUSUAL BRUISING OR BLEEDING  TENDERNESS IN MOUTH AND THROAT WITH OR WITHOUT PRESENCE OF ULCERS  *URINARY PROBLEMS  *BOWEL PROBLEMS  UNUSUAL RASH Items with * indicate a potential emergency and should be followed up as soon as possible.  Feel free to call the clinic should you have any questions or concerns. The clinic phone number is [phone].  Please show the [facility] at check-in to the Emergency Department and triage nurse.

## 2020-02-20 NOTE — Progress Notes (Signed)
Patient presents today for Imfinzi infusion.  Vital signs stable.  No new complaints since last visit.  Labs reviewed and within parameters for treatment.  Treatment given today per MD orders.  Tolerated infusion without adverse affects.  Vital signs stable.  No complaints at this time.  Discharge from clinic ambulatory in stable condition.  Alert and oriented X 3.  Follow up with [facility] as scheduled.

## 2020-03-02 ENCOUNTER — Other Ambulatory Visit: Payer: Self-pay

## 2020-03-02 ENCOUNTER — Ambulatory Visit (HOSPITAL_COMMUNITY)
Admission: RE | Admit: 2020-03-02 | Discharge: 2020-03-02 | Disposition: A | Discharge status: Home / Self Care | Payer: Medicare Other | Source: Ambulatory Visit | Attending: Hematology | Admitting: Hematology

## 2020-03-02 DIAGNOSIS — I517 Cardiomegaly: Secondary | ICD-10-CM | POA: Diagnosis not present

## 2020-03-02 DIAGNOSIS — C3490 Malignant neoplasm of unspecified part of unspecified bronchus or lung: Secondary | ICD-10-CM | POA: Diagnosis not present

## 2020-03-02 DIAGNOSIS — I700 Atherosclerosis of aorta: Secondary | ICD-10-CM | POA: Diagnosis not present

## 2020-03-02 DIAGNOSIS — C3492 Malignant neoplasm of unspecified part of left bronchus or lung: Secondary | ICD-10-CM | POA: Diagnosis not present

## 2020-03-02 DIAGNOSIS — M19011 Primary osteoarthritis, right shoulder: Secondary | ICD-10-CM | POA: Diagnosis not present

## 2020-03-02 IMAGING — CT CT CHEST W/ CM
2 of 4 series · 15 of 36 positions shown, 18 images · IV contrast (omnipaque)
Comparison: [DATE].

CLINICAL DATA: Lung cancer status post chemo radiation.

EXAM:
CT CHEST WITH CONTRAST
TECHNIQUE: Multidetector CT imaging of the chest was performed during
intravenous contrast administration.
CONTRAST:  75mL OMNIPAQUE IOHEXOL 300 MG/ML  SOLN

[Series 2: routine chest with · axial · 0.72mm/px · z∈[-176,+72]mm · 12 of 146 slices shown, 15 images]
[im 11/146  mediastinal]
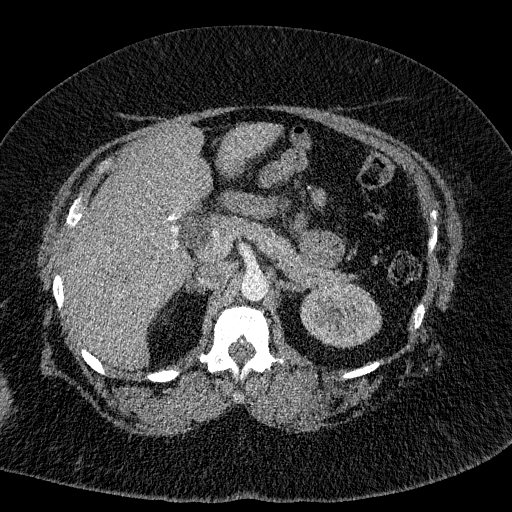
[im 11/146  lung]
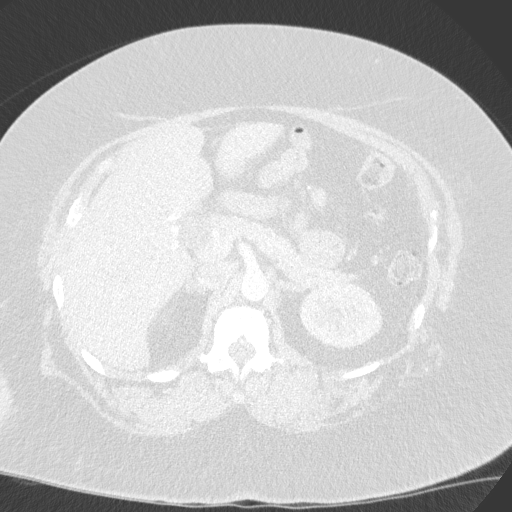
[im 21/146  lung]
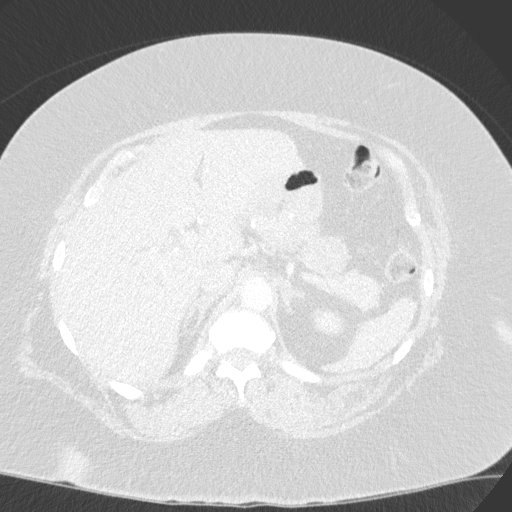
[im 32/146  lung]
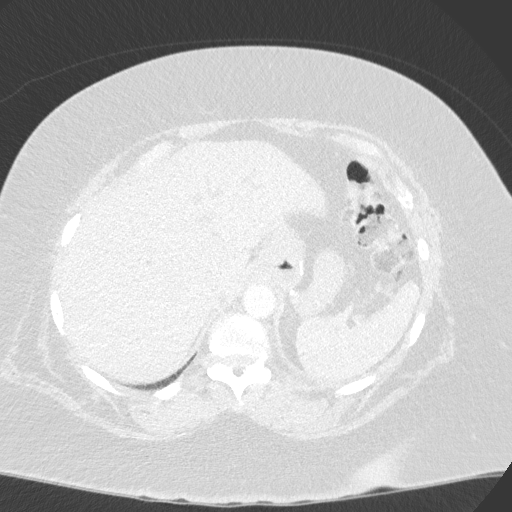
[im 42/146  lung]
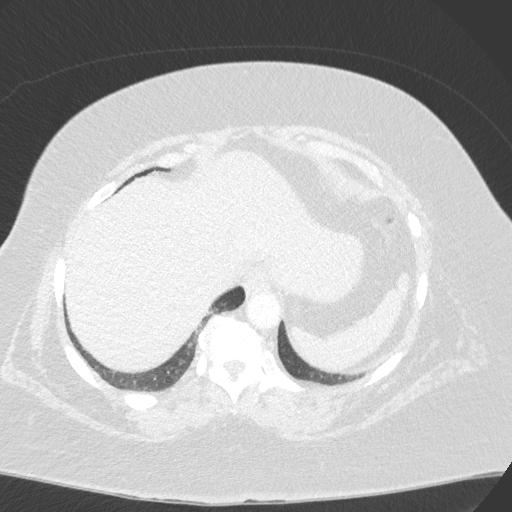
[im 52/146  mediastinal]
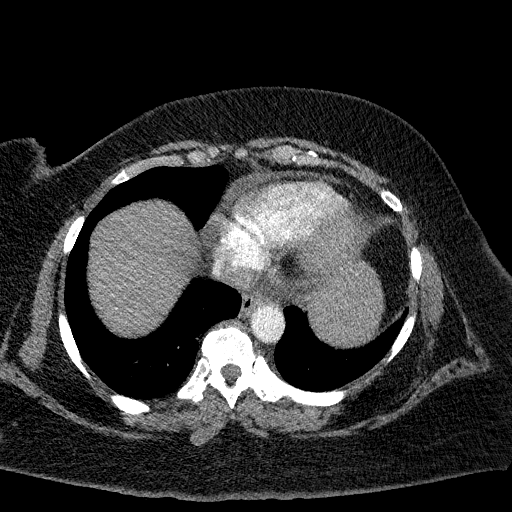
[im 52/146  lung]
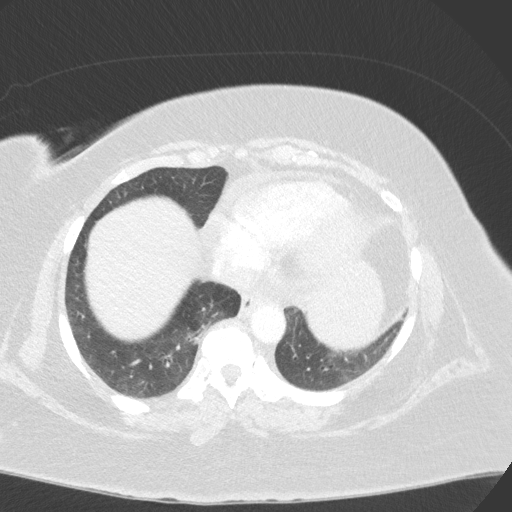
[im 63/146  lung]
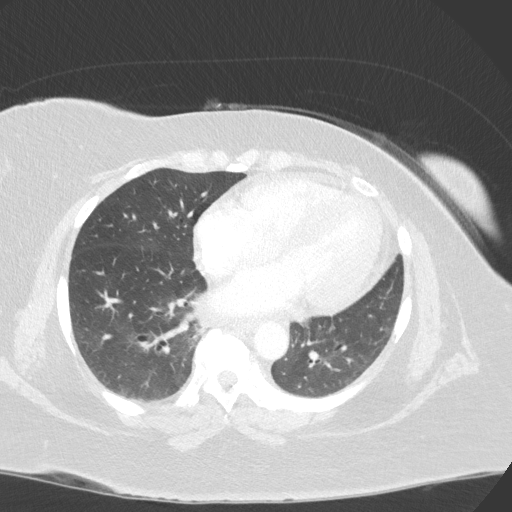
[im 83/146  lung]
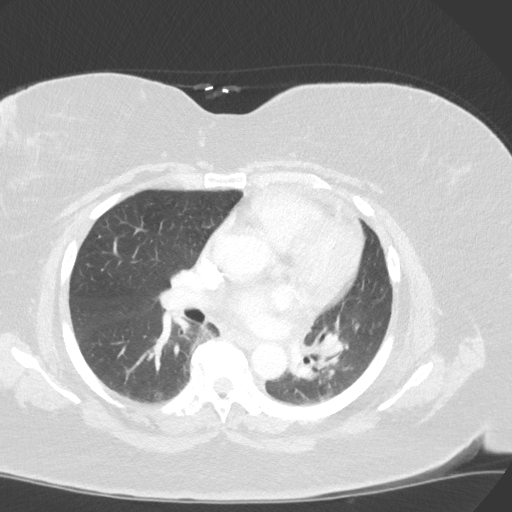
[im 94/146  lung]
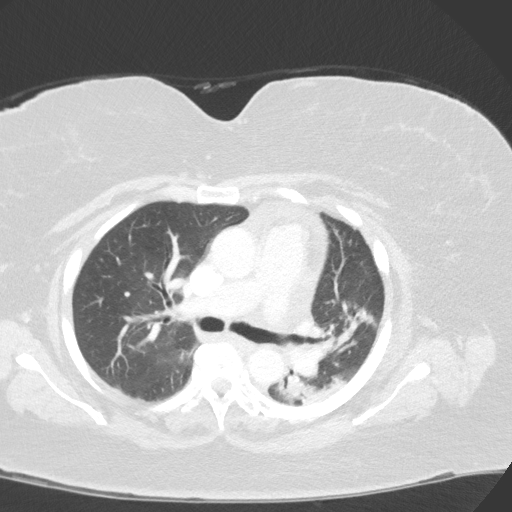
[im 104/146  mediastinal]
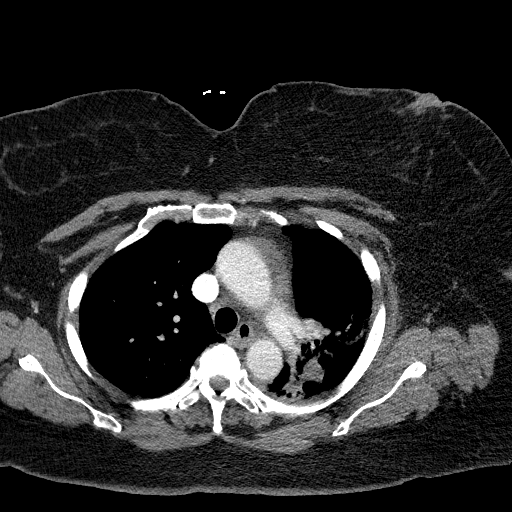
[im 104/146  lung]
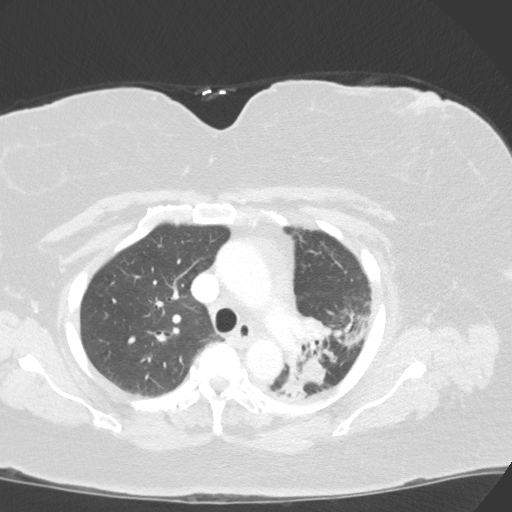
[im 114/146  lung]
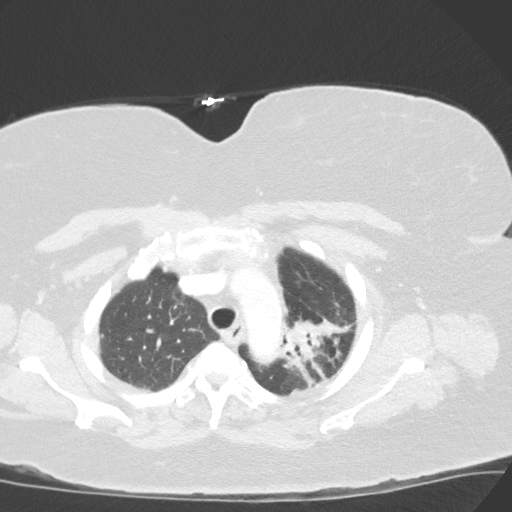
[im 125/146  lung]
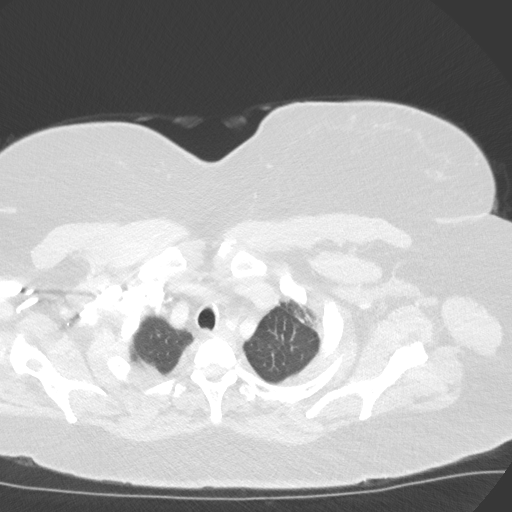
[im 135/146  lung]
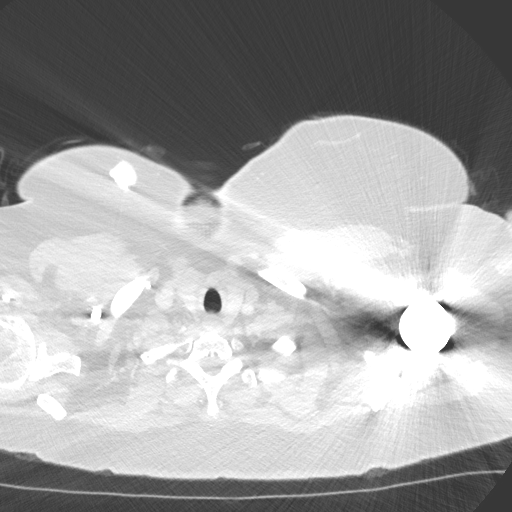

[Series 5: coronal · coronal · 0.60mm/px · 3 of 121 slices shown]
[im 25/121  lung]
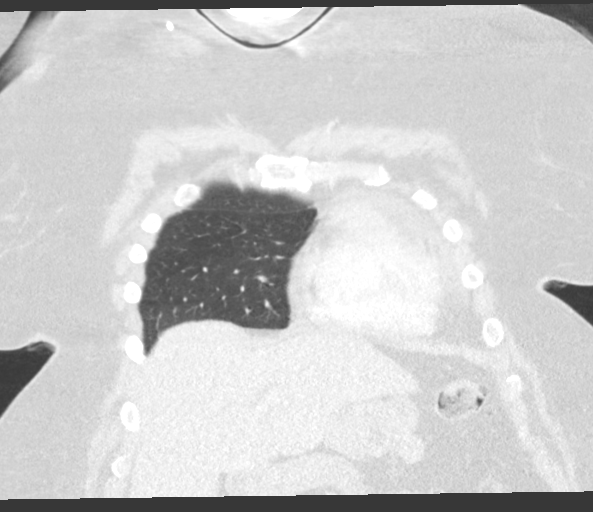
[im 49/121  lung]
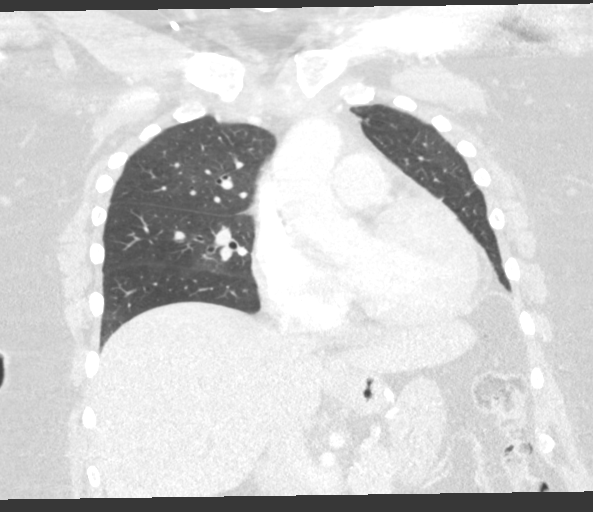
[im 73/121  lung]
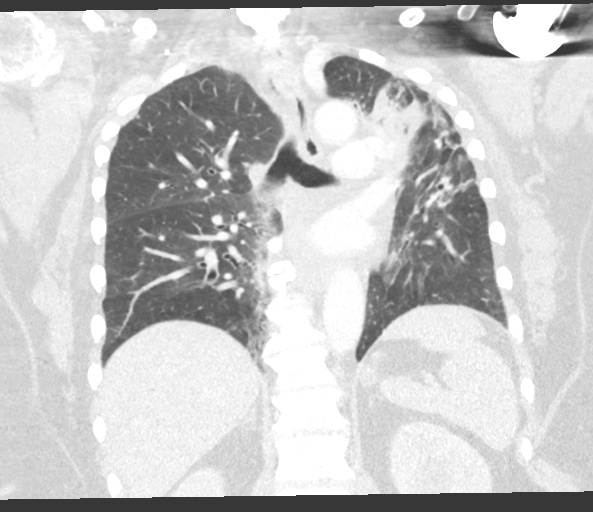

[15 of 36 positions shown; findings below may reference images not displayed]

FINDINGS: Cardiovascular: Right IJ Port-A-Cath terminates at the SVC RA
junction. Atherosclerotic calcification of the aorta and aortic
valve. Heart is enlarged. Small amount of pericardial fluid appears
similar.

Mediastinum/Nodes: No pathologically enlarged mediastinal, hilar or
axillary lymph nodes. Esophagus is unremarkable.

Lungs/Pleura: Nodular soft tissue in the posterior left upper lobe
measures 1.6 x 2.0 cm (4/40), decreased slightly in size from 2.0 x
2.5 cm on [DATE]. Surrounding parenchymal retraction,
bronchiectasis and architectural distortion, indicative of radiation
therapy. Nonspecific mosaic attenuation in the right lower lobe,
similar. No pleural fluid. Airway is unremarkable.

Upper Abdomen: Biliary ductal dilatation after cholecystectomy
appears stable. 1.8 cm low-attenuation lesion in the left hepatic
lobe is unchanged and likely a cyst. Slight nodular thickening of
the adrenal glands. Visualized portions of the kidneys, spleen and
pancreas are unremarkable. Postoperative changes in the stomach. No
upper abdominal adenopathy.

Musculoskeletal: Degenerative changes in the spine. Advanced
degenerative changes in the right shoulder. Left shoulder
arthroplasty.
IMPRESSION: 1. Slight decrease in size of a posterior left upper lobe nodule
with surrounding changes of radiation therapy. No evidence of
metastatic disease.
2. Small amount of pericardial fluid, similar.
3.  Aortic atherosclerosis ([XE]-[XE]).

## 2020-03-02 MED ORDER — IOHEXOL 300 MG/ML  SOLN: 100.0000 mL | Freq: Once | INTRAMUSCULAR | Status: DC | PRN

## 2020-03-02 MED ORDER — IOHEXOL 300 MG/ML  SOLN
75.0000 mL | Freq: Once | INTRAMUSCULAR | Status: AC | PRN
  Administered 2020-03-02: 16:00:00 75 mL via INTRAVENOUS

## 2020-03-03 NOTE — Progress Notes (Signed)
[place], [place] [id number]   CLINIC:  Medical Oncology/Hematology  PCP:  Iona Beard, [address] / [place] [address] [phone]  REASON FOR VISIT:  Follow-up for stage III left lung adenocarcinoma  PRIOR THERAPY:   1. Chemoradiation with carboplatin and paclitaxel from 01/07/2019 to 02/11/2019. 2. Consolidation with durvalumab from 03/19/2019 to 04/16/2019, held due to pneumonitis.  NGS Results: Not done  CURRENT THERAPY: Consolidation with durvalumab every 2 weeks  BRIEF ONCOLOGIC HISTORY:  Oncology History  Adenocarcinoma of left lung ([facility])  12/09/2018 Initial Diagnosis   Adenocarcinoma of left lung ([facility])   01/02/2019 Cancer Staging   Staging form: Lung, AJCC 8th Edition - Clinical stage from 01/02/2019: Stage IIIB (cT3, cN2, cM0) - Signed by Derek Jack, MD on 01/02/2019   01/07/2019 - 02/11/2019 Chemotherapy   The patient had palonosetron (ALOXI) injection 0.25 mg, 0.25 mg, Intravenous,  Once, 6 of 6 cycles Administration: 0.25 mg (01/07/2019), 0.25 mg (01/14/2019), 0.25 mg (01/21/2019), 0.25 mg (01/28/2019), 0.25 mg (02/04/2019), 0.25 mg (02/11/2019) CARBOplatin (PARAPLATIN) 270 mg in sodium chloride 0.9 % 250 mL chemo infusion, 270 mg (100 % of original dose 266.4 mg), Intravenous,  Once, 6 of 6 cycles Dose modification:   (original dose 266.4 mg, Cycle 1),   (original dose 266.4 mg, Cycle 2),   (original dose 266.4 mg, Cycle 3),   (original dose 266.4 mg, Cycle 4) Administration: 270 mg (01/07/2019), 270 mg (01/14/2019), 270 mg (01/21/2019), 270 mg (01/28/2019), 270 mg (02/04/2019), 270 mg (02/11/2019) PACLitaxel (TAXOL) 108 mg in sodium chloride 0.9 % 250 mL chemo infusion (</= 80mg /m2), 45 mg/m2 = 108 mg, Intravenous,  Once, 6 of 6 cycles Administration: 108 mg (01/07/2019), 108 mg (01/14/2019), 108 mg (01/21/2019), 108 mg (01/28/2019), 108 mg (02/04/2019), 108 mg (02/11/2019) fosaprepitant (EMEND) 150  mg, dexamethasone (DECADRON) 12 mg in sodium chloride 0.9 % 145 mL IVPB, , Intravenous,  Once, 5 of 5 cycles Administration:  (01/14/2019),  (01/21/2019),  (01/28/2019),  (02/04/2019),  (02/11/2019)  for chemotherapy treatment.    03/19/2019 -  Chemotherapy   The patient had durvalumab (IMFINZI) 1,240 mg in sodium chloride 0.9 % 100 mL chemo infusion, 9.9 mg/kg = 1,260 mg, Intravenous,  Once, 19 of 21 cycles Administration: 1,240 mg (03/19/2019), 1,240 mg (04/02/2019), 1,240 mg (04/16/2019), 1,240 mg (07/24/2019), 1,240 mg (08/07/2019), 1,240 mg (08/21/2019), 1,240 mg (09/04/2019), 1,240 mg (09/19/2019), 1,240 mg (10/02/2019), 1,240 mg (10/17/2019), 1,240 mg (10/31/2019), 1,240 mg (11/13/2019), 1,240 mg (11/27/2019), 1,240 mg (12/11/2019), 1,240 mg (12/26/2019), 1,240 mg (01/08/2020), 1,240 mg (01/22/2020), 1,240 mg (02/05/2020), 1,240 mg (02/20/2020)  for chemotherapy treatment.      CANCER STAGING: Cancer Staging Adenocarcinoma of left lung [facility]) Staging form: Lung, AJCC 8th Edition - Clinical stage from 01/02/2019: Stage IIIB (cT3, cN2, cM0) - Signed by Derek Jack, MD on 01/02/2019   INTERVAL HISTORY:  Ms. Harriett Azar, a 69 y.o. female, returns for routine follow-up and consideration for next cycle of immunotherapy. Chieko was last seen on 01/08/2020.  Due for cycle #20 of durvalumab today.   Overall, she tells me she has been feeling pretty well. She complains of SOB with exertion and ongoing pain in her right knee from arthritis. Her appetite is excellent and she denies N/V/D.  Overall, she feels ready for next cycle of immunotherapy today.    REVIEW OF SYSTEMS:  Review of Systems  Constitutional: Positive for fatigue (75%). Negative for appetite change.  Respiratory: Positive for cough (  stable) and shortness of breath (w/ exertion).   Gastrointestinal: Negative for diarrhea, nausea and vomiting.  Musculoskeletal: Positive for arthralgias (6/10 R knee pain).  All other systems reviewed and  are negative.   PAST MEDICAL/SURGICAL HISTORY:  Past Medical History:  Diagnosis Date  . Anemia   . Aortic stenosis   . Arthritis   . Cancer ([facility])    Lung  . Coronary artery calcification seen on CT scan   . Essential hypertension   . GERD (gastroesophageal reflux disease)   . History of lung cancer    Stage III adenocarcinoma status post chemoradiation  . Port-A-Cath in place 01/06/2019  . Type 2 diabetes mellitus ([facility])    Past Surgical History:  Procedure Laterality Date  . CHOLECYSTECTOMY  1997  . COLONOSCOPY    . GASTRIC BYPASS    . INCISIONAL HERNIA REPAIR  04/11/11  . IR IMAGING GUIDED PORT INSERTION  12/27/2018   Right  . LAPAROSCOPIC SALPINGOOPHERECTOMY    . LAPAROTOMY  04/11/2011   Procedure: EXPLORATORY LAPAROTOMY;  Surgeon: Joyice Faster. Cornett, MD;  Location: [facility];  Service: General;  Laterality: N/A;  closure port hole  . RIGHT/LEFT HEART CATH AND CORONARY ANGIOGRAPHY N/A 12/29/2019   Procedure: RIGHT/LEFT HEART CATH AND CORONARY ANGIOGRAPHY;  Surgeon: Troy Sine, MD;  Location: [facility];  Service: Cardiovascular;  Laterality: N/A;  . TOTAL HIP ARTHROPLASTY  03/07/2012   Procedure: TOTAL HIP ARTHROPLASTY ANTERIOR APPROACH;  Surgeon: Mauri Pole, MD;  Location: [facility];  Service: Orthopedics;  Laterality: Right;  . TOTAL SHOULDER ARTHROPLASTY Left 01/21/2015  . TOTAL SHOULDER ARTHROPLASTY Left 01/21/2015   Procedure: LEFT TOTAL SHOULDER ARTHROPLASTY;  Surgeon: Justice Britain, MD;  Location: [facility];  Service: Orthopedics;  Laterality: Left;  Marland Kitchen VAGINAL HYSTERECTOMY      SOCIAL HISTORY:  Social History   Socioeconomic History  . Marital status: Single    Spouse name: Not on file  . Number of children: Not on file  . Years of education: 12th grade  . Highest education level: Not on file  Occupational History  . Occupation: Employed    Employer: [employer]  Tobacco Use  . Smoking status: Former Smoker    Packs/day: 1.00    Years:  12.00    Pack years: 12.00    Types: Cigarettes    Quit date: 03/20/1976    Years since quitting: 43.9  . Smokeless tobacco: Never Used  Vaping Use  . Vaping Use: Never used  Substance and Sexual Activity  . Alcohol use: No  . Drug use: No  . Sexual activity: Not on file  Other Topics Concern  . Not on file  Social History Narrative  . Not on file   Social Determinants of Health   Financial Resource Strain: Not on file  Food Insecurity: Not on file  Transportation Needs: Not on file  Physical Activity: Not on file  Stress: Not on file  Social Connections: Not on file  Intimate Partner Violence: Not on file    FAMILY HISTORY:  Family History  Problem Relation Age of Onset  . Breast cancer Mother   . COPD Mother   . Arthritis Mother   . Diabetes Mother   . Hypertension Mother   . Hypertension Father   . Diabetes Father   . Breast cancer Sister   . Thyroid cancer Brother   . Huntington's disease Maternal Grandmother   . Heart attack Brother     CURRENT MEDICATIONS:  Current Outpatient Medications  Medication Sig Dispense Refill  . Acetaminophen (TYLENOL 8 HOUR ARTHRITIS PAIN PO) Take 650 ng by mouth 2 (two) times daily.     Marland Kitchen albuterol (VENTOLIN HFA) 108 (90 Base) MCG/ACT inhaler Inhale into the lungs.    Marland Kitchen amLODipine (NORVASC) 5 MG tablet Take 5 mg by mouth daily.     Marland Kitchen aspirin EC 81 MG tablet Take 81 mg by mouth daily. Swallow whole.    . Calcium Carbonate Antacid (TUMS PO) Take 4 tablets by mouth daily.     . carboxymethylcellul-glycerin (OPTIVE) 0.5-0.9 % ophthalmic solution Place 1-2 drops into both eyes 3 (three) times daily as needed (dry/irritated eyes.).    Marland Kitchen carvedilol (COREG) 3.125 MG tablet Take 1 tablet (3.125 mg total) by mouth 2 (two) times daily. 60 tablet 6  . Cyanocobalamin (VITAMIN B-12) 2500 MCG SUBL Place 2,500 mcg under the tongue every morning.     . diphenhydrAMINE (BENADRYL) 25 MG tablet Take 25 mg by mouth every 6 (six) hours as needed for  itching or allergies.     Hunt Oris IV Inject into the vein every 14 (fourteen) days.    . ferrous sulfate 325 (65 FE) MG tablet Take 325 mg by mouth every evening.    . furosemide (LASIX) 20 MG tablet TAKE 1 TABLET (20 MG TOTAL) BY MOUTH AS NEEDED FOR FLUID OR EDEMA (DAILY AS NEEDED FOR SWELLING). 90 tablet 1  . HYDROcodone-acetaminophen (NORCO/VICODIN) 5-325 MG tablet Take 1 tablet by mouth 2 (two) times daily as needed.    . Multiple Vitamin (MULITIVITAMIN WITH MINERALS) TABS Take 1 tablet by mouth daily.     . naproxen (NAPROSYN) 500 MG tablet Take 500 mg by mouth every 12 (twelve) hours.     Marland Kitchen omeprazole (PRILOSEC) 20 MG capsule Take 20 mg by mouth daily.     Current Facility-Administered Medications  Medication Dose Route Frequency Provider Last Rate Last Admin  . sodium chloride flush (NS) 0.9 % injection 3 mL  3 mL Intravenous Q12H Satira Sark, MD        ALLERGIES:  No Known Allergies  PHYSICAL EXAM:  Performance status (ECOG): 1 - Symptomatic but completely ambulatory  There were no vitals filed for this visit. Wt Readings from Last 3 Encounters:  02/20/20 286 lb 3.2 oz (129.8 kg)  02/05/20 281 lb 8 oz (127.7 kg)  01/30/20 285 lb (129.3 kg)   Physical Exam Vitals reviewed.  Constitutional:      Appearance: Normal appearance. She is obese.  Cardiovascular:     Rate and Rhythm: Normal rate and regular rhythm.     Pulses: Normal pulses.     Heart sounds: Normal heart sounds.  Pulmonary:     Effort: Pulmonary effort is normal.     Breath sounds: Normal breath sounds.  Musculoskeletal:     Right lower leg: No edema.     Left lower leg: No edema.  Neurological:     General: No focal deficit present.     Mental Status: She is alert and oriented to person, place, and time.  Psychiatric:        Mood and Affect: Mood normal.        Behavior: Behavior normal.     LABORATORY DATA:  I have reviewed the labs as listed.  CBC Latest Ref Rng & Units 02/20/2020  02/05/2020 01/22/2020  WBC 4.0 - 10.5 K/uL 4.5 4.7 5.5  Hemoglobin 12.0 - 15.0 g/dL 11.2(L) 11.5(L) 11.5(L)  Hematocrit 36.0 -  46.0 % 35.8(L) 37.8 37.9  Platelets 150 - 400 K/uL 310 340 335   CMP Latest Ref Rng & Units 02/20/2020 02/05/2020 01/22/2020  Glucose 70 - 99 mg/dL 107(H) 104(H) 106(H)  BUN 8 - 23 mg/dL 13 12 14   Creatinine 0.44 - 1.00 mg/dL 0.67 0.67 0.73  Sodium 135 - 145 mmol/L 141 139 141  Potassium 3.5 - 5.1 mmol/L 3.5 3.9 3.9  Chloride 98 - 111 mmol/L 106 107 108  CO2 22 - 32 mmol/L 26 26 25   Calcium 8.9 - 10.3 mg/dL 8.8(L) 8.9 8.8(L)  Total Protein 6.5 - 8.1 g/dL 6.3(L) 6.5 6.6  Total Bilirubin 0.3 - 1.2 mg/dL 0.5 0.5 0.5  Alkaline Phos 38 - 126 U/L 63 76 74  AST 15 - 41 U/L 15 18 18   ALT 0 - 44 U/L 16 19 19     DIAGNOSTIC IMAGING:  I have independently reviewed the scans and discussed with the patient. CT Chest W Contrast  Result Date: 03/03/2020 CLINICAL DATA:  Lung cancer status post chemo radiation. EXAM: CT CHEST WITH CONTRAST TECHNIQUE: Multidetector CT imaging of the chest was performed during intravenous contrast administration. CONTRAST:  42mL OMNIPAQUE IOHEXOL 300 MG/ML  SOLN COMPARISON:  12/10/2019. FINDINGS: Cardiovascular: Right IJ Port-A-Cath terminates at the SVC RA junction. Atherosclerotic calcification of the aorta and aortic valve. Heart is enlarged. Small amount of pericardial fluid appears similar. Mediastinum/Nodes: No pathologically enlarged mediastinal, hilar or axillary lymph nodes. Esophagus is unremarkable. Lungs/Pleura: Nodular soft tissue in the posterior left upper lobe measures 1.6 x 2.0 cm (4/40), decreased slightly in size from 2.0 x 2.5 cm on 12/10/2019. Surrounding parenchymal retraction, bronchiectasis and architectural distortion, indicative of radiation therapy. Nonspecific mosaic attenuation in the right lower lobe, similar. No pleural fluid. Airway is unremarkable. Upper Abdomen: Biliary ductal dilatation after cholecystectomy appears  stable. 1.8 cm low-attenuation lesion in the left hepatic lobe is unchanged and likely a cyst. Slight nodular thickening of the adrenal glands. Visualized portions of the kidneys, spleen and pancreas are unremarkable. Postoperative changes in the stomach. No upper abdominal adenopathy. Musculoskeletal: Degenerative changes in the spine. Advanced degenerative changes in the right shoulder. Left shoulder arthroplasty. IMPRESSION: 1. Slight decrease in size of a posterior left upper lobe nodule with surrounding changes of radiation therapy. No evidence of metastatic disease. 2. Small amount of pericardial fluid, similar. 3.  Aortic atherosclerosis (ICD10-I70.0). Electronically Signed   By: Lorin Picket M.D.   On: 03/03/2020 10:49     ASSESSMENT:  1.Adenocarcinoma of left lung ([facility]) -Chemoradiation therapy with carboplatin and paclitaxel from 01/07/2019 through 02/11/2019. -Consolidation immunotherapy with durvalumab from 03/19/2019 through 04/15/2018, held due to pneumonitis. -CT chest on 07/02/2019 shows left upper lobe lung mass measuring 2.6 x 2.0 cm. It shows improvement in size. -MRI of the brain on 07/18/2019 showed left sphenoid wing meningioma measuring 2.6 x 2.0 x 2.6 cm unchanged. No new enhancing intracranial lesion. Increased left temporal white matter edema. -Durvalumab restarted on 07/24/2019. -We reviewed CT chest with contrast from 09/29/2019 which showed stable appearance of treated lung lesion within the posterior left upper lobe. New bandlike area of increased soft tissue density within the anterior to the treated lung lesion favored to be progressive changes from radiation. Local tumor recurrence is less favored. Stable pericardial effusion. -CT chest on 12/10/2019 shows posttreatment appearance of the left lung with associated scarring and volume loss.  No evidence of recurrence or metastatic disease.  Unchanged small pericardial effusion. -She was evaluated by cardiology with a  stress test.  EF was 46%.  She underwent cardiac catheterization which did not show any abnormalities.   PLAN:   1. Clinical stage IIIb (T3N2) adenocarcinoma of the lung: -She does not report any immunotherapy related side effects after last treatment. -Baseline shortness of breath on exertion is stable. -Reviewed labs which showed normal chemistries and CBC.  TSH was 1.57 -She will proceed with her treatment today and in 2 weeks.   RTC 4 weeks with repeat labs.   Discussed most recent imaging which showed slight decrease in size of LUL nodule.  2. Shortness of breath on exertion: -Continue follow-up with pulmonary.  3. Radiation pneumonitis: -Prednisone was tapered off in May 2021.  No worsening on recent scans.  No worsening shortness of breath.  4. Meningioma: -No headaches or other symptoms at this time.  5.Hypertension: -Continue Norvasc.  Blood pressure is well controlled.  6.Bilateral leg swelling: -Continue Lasix as needed.  7. Bilateral knee pains, right more than left: -She had bilateral knee injections.  She still has right knee pain.   Orders placed this encounter:  No orders of the defined types were placed in this encounter.  Benay Pike MD    I spent 30 min in the care of this patient including review of records, H and P, planning, coordination of care.

## 2020-03-04 ENCOUNTER — Inpatient Hospital Stay (HOSPITAL_BASED_OUTPATIENT_CLINIC_OR_DEPARTMENT_OTHER): Payer: Medicare Other | Admitting: Hematology and Oncology

## 2020-03-04 ENCOUNTER — Inpatient Hospital Stay (HOSPITAL_COMMUNITY): Payer: Medicare Other

## 2020-03-04 ENCOUNTER — Other Ambulatory Visit: Payer: Self-pay

## 2020-03-04 VITALS — BP 95/80 | HR 81 | Temp 97.0°F | Resp 18

## 2020-03-04 VITALS — BP 136/85 | HR 92 | Temp 97.2°F | Resp 20 | Wt 281.6 lb

## 2020-03-04 DIAGNOSIS — Z87891 Personal history of nicotine dependence: Secondary | ICD-10-CM | POA: Diagnosis not present

## 2020-03-04 DIAGNOSIS — R0602 Shortness of breath: Secondary | ICD-10-CM | POA: Diagnosis not present

## 2020-03-04 DIAGNOSIS — C3492 Malignant neoplasm of unspecified part of left bronchus or lung: Secondary | ICD-10-CM

## 2020-03-04 DIAGNOSIS — Z923 Personal history of irradiation: Secondary | ICD-10-CM | POA: Diagnosis not present

## 2020-03-04 DIAGNOSIS — Z9221 Personal history of antineoplastic chemotherapy: Secondary | ICD-10-CM | POA: Diagnosis not present

## 2020-03-04 DIAGNOSIS — Z95828 Presence of other vascular implants and grafts: Secondary | ICD-10-CM

## 2020-03-04 DIAGNOSIS — C3412 Malignant neoplasm of upper lobe, left bronchus or lung: Secondary | ICD-10-CM | POA: Diagnosis not present

## 2020-03-04 DIAGNOSIS — Z5112 Encounter for antineoplastic immunotherapy: Secondary | ICD-10-CM | POA: Diagnosis not present

## 2020-03-04 LAB — CBC WITH DIFFERENTIAL/PLATELET
Abs Immature Granulocytes: 0 10*3/uL (ref 0.00–0.07)
Basophils Absolute: 0 10*3/uL (ref 0.0–0.1)
Basophils Relative: 1 %
Eosinophils Absolute: 0.1 10*3/uL (ref 0.0–0.5)
Eosinophils Relative: 2 %
HCT: 38.9 % (ref 36.0–46.0)
Hemoglobin: 11.9 g/dL — ABNORMAL LOW (ref 12.0–15.0)
Immature Granulocytes: 0 %
Lymphocytes Relative: 14 %
Lymphs Abs: 0.7 10*3/uL (ref 0.7–4.0)
MCH: 27.3 pg (ref 26.0–34.0)
MCHC: 30.6 g/dL (ref 30.0–36.0)
MCV: 89.2 fL (ref 80.0–100.0)
Monocytes Absolute: 0.5 10*3/uL (ref 0.1–1.0)
Monocytes Relative: 9 %
Neutro Abs: 3.9 10*3/uL (ref 1.7–7.7)
Neutrophils Relative %: 74 %
Platelets: 400 10*3/uL (ref 150–400)
RBC: 4.36 MIL/uL (ref 3.87–5.11)
RDW: 14.6 % (ref 11.5–15.5)
WBC: 5.2 10*3/uL (ref 4.0–10.5)
nRBC: 0 % (ref 0.0–0.2)

## 2020-03-04 LAB — COMPREHENSIVE METABOLIC PANEL
ALT: 24 U/L (ref 0–44)
AST: 19 U/L (ref 15–41)
Albumin: 3.6 g/dL (ref 3.5–5.0)
Alkaline Phosphatase: 75 U/L (ref 38–126)
Anion gap: 7 (ref 5–15)
BUN: 11 mg/dL (ref 8–23)
CO2: 25 mmol/L (ref 22–32)
Calcium: 8.8 mg/dL — ABNORMAL LOW (ref 8.9–10.3)
Chloride: 105 mmol/L (ref 98–111)
Creatinine, Ser: 0.71 mg/dL (ref 0.44–1.00)
GFR, Estimated: >60 mL/min (ref >60)
Glucose, Bld: 106 mg/dL — ABNORMAL HIGH (ref 70–99)
Potassium: 3.8 mmol/L (ref 3.5–5.1)
Sodium: 137 mmol/L (ref 135–145)
Total Bilirubin: 0.5 mg/dL (ref 0.3–1.2)
Total Protein: 6.6 g/dL (ref 6.5–8.1)

## 2020-03-04 LAB — TSH: TSH: 1.574 u[IU]/mL (ref 0.350–4.500)

## 2020-03-04 MED ORDER — SODIUM CHLORIDE 0.9 % IV SOLN: Freq: Once | INTRAVENOUS | Status: AC

## 2020-03-04 MED ORDER — SODIUM CHLORIDE 0.9 % IV SOLN
10.0000 mg/kg | Freq: Once | INTRAVENOUS | Status: AC
  Administered 2020-03-04: 12:00:00 1240 mg via INTRAVENOUS
  Filled 2020-03-04: qty 20

## 2020-03-04 MED ORDER — SODIUM CHLORIDE 0.9% FLUSH
10.0000 mL | INTRAVENOUS | Status: DC | PRN
  Administered 2020-03-04: 13:00:00 10 mL

## 2020-03-04 MED ORDER — HEPARIN SOD (PORK) LOCK FLUSH 100 UNIT/ML IV SOLN
500.0000 [IU] | Freq: Once | INTRAVENOUS | Status: AC | PRN
  Administered 2020-03-04: 13:00:00 500 [IU]

## 2020-03-04 NOTE — Progress Notes (Signed)
Patient presents today for treatment.  Vital signs and labs within parameters for treatment.  Patients only complaint is shortness of breath and fatigue with exertion, usually relieved with rest.  Labs reviewed and within parameters for treatment.  Messaged received from Dr. Chryl Heck tto proceed with treatment.  Treatment given today per MD orders.  Tolerated infusion without adverse affects.  Vital signs stable.  No complaints at this time.  Discharge from clinic ambulatory in stable condition.  Alert and oriented X 3.  Follow up with [facility] as scheduled.

## 2020-03-04 NOTE — Patient Instructions (Signed)
[facility] Discharge Instructions for Patients Receiving Chemotherapy  Today you received the following chemotherapy agents   To help prevent nausea and vomiting after your treatment, we encourage you to take your nausea medication   If you develop nausea and vomiting that is not controlled by your nausea medication, call the clinic.   BELOW ARE SYMPTOMS THAT SHOULD BE REPORTED IMMEDIATELY:  *FEVER GREATER THAN 100.5 F  *CHILLS WITH OR WITHOUT FEVER  NAUSEA AND VOMITING THAT IS NOT CONTROLLED WITH YOUR NAUSEA MEDICATION  *UNUSUAL SHORTNESS OF BREATH  *UNUSUAL BRUISING OR BLEEDING  TENDERNESS IN MOUTH AND THROAT WITH OR WITHOUT PRESENCE OF ULCERS  *URINARY PROBLEMS  *BOWEL PROBLEMS  UNUSUAL RASH Items with * indicate a potential emergency and should be followed up as soon as possible.  Feel free to call the clinic should you have any questions or concerns. The clinic phone number is [phone].  Please show the CHEMO ALERT CARD at check-in to the Emergency Department and triage nurse.   

## 2020-03-18 ENCOUNTER — Inpatient Hospital Stay (HOSPITAL_COMMUNITY): Payer: Medicare Other

## 2020-03-18 ENCOUNTER — Other Ambulatory Visit: Payer: Self-pay

## 2020-03-18 VITALS — BP 158/57 | HR 98 | Temp 96.8°F | Resp 20 | Wt 285.0 lb

## 2020-03-18 DIAGNOSIS — Z95828 Presence of other vascular implants and grafts: Secondary | ICD-10-CM

## 2020-03-18 DIAGNOSIS — Z87891 Personal history of nicotine dependence: Secondary | ICD-10-CM | POA: Diagnosis not present

## 2020-03-18 DIAGNOSIS — Z5112 Encounter for antineoplastic immunotherapy: Secondary | ICD-10-CM | POA: Diagnosis not present

## 2020-03-18 DIAGNOSIS — C3492 Malignant neoplasm of unspecified part of left bronchus or lung: Secondary | ICD-10-CM

## 2020-03-18 DIAGNOSIS — Z9221 Personal history of antineoplastic chemotherapy: Secondary | ICD-10-CM | POA: Diagnosis not present

## 2020-03-18 DIAGNOSIS — C3412 Malignant neoplasm of upper lobe, left bronchus or lung: Secondary | ICD-10-CM | POA: Diagnosis not present

## 2020-03-18 DIAGNOSIS — R0602 Shortness of breath: Secondary | ICD-10-CM | POA: Diagnosis not present

## 2020-03-18 DIAGNOSIS — Z923 Personal history of irradiation: Secondary | ICD-10-CM | POA: Diagnosis not present

## 2020-03-18 LAB — COMPREHENSIVE METABOLIC PANEL
ALT: 17 U/L (ref 0–44)
AST: 18 U/L (ref 15–41)
Albumin: 3.3 g/dL — ABNORMAL LOW (ref 3.5–5.0)
Alkaline Phosphatase: 76 U/L (ref 38–126)
Anion gap: 8 (ref 5–15)
BUN: 12 mg/dL (ref 8–23)
CO2: 25 mmol/L (ref 22–32)
Calcium: 8.4 mg/dL — ABNORMAL LOW (ref 8.9–10.3)
Chloride: 108 mmol/L (ref 98–111)
Creatinine, Ser: 0.65 mg/dL (ref 0.44–1.00)
GFR, Estimated: >60 mL/min (ref >60)
Glucose, Bld: 109 mg/dL — ABNORMAL HIGH (ref 70–99)
Potassium: 3.6 mmol/L (ref 3.5–5.1)
Sodium: 141 mmol/L (ref 135–145)
Total Bilirubin: 0.5 mg/dL (ref 0.3–1.2)
Total Protein: 6.3 g/dL — ABNORMAL LOW (ref 6.5–8.1)

## 2020-03-18 LAB — CBC WITH DIFFERENTIAL/PLATELET
Abs Immature Granulocytes: 0.01 10*3/uL (ref 0.00–0.07)
Basophils Absolute: 0 10*3/uL (ref 0.0–0.1)
Basophils Relative: 1 %
Eosinophils Absolute: 0.1 10*3/uL (ref 0.0–0.5)
Eosinophils Relative: 3 %
HCT: 36.1 % (ref 36.0–46.0)
Hemoglobin: 11.1 g/dL — ABNORMAL LOW (ref 12.0–15.0)
Immature Granulocytes: 0 %
Lymphocytes Relative: 12 %
Lymphs Abs: 0.6 10*3/uL — ABNORMAL LOW (ref 0.7–4.0)
MCH: 27.3 pg (ref 26.0–34.0)
MCHC: 30.7 g/dL (ref 30.0–36.0)
MCV: 88.9 fL (ref 80.0–100.0)
Monocytes Absolute: 0.4 10*3/uL (ref 0.1–1.0)
Monocytes Relative: 8 %
Neutro Abs: 3.6 10*3/uL (ref 1.7–7.7)
Neutrophils Relative %: 76 %
Platelets: 313 10*3/uL (ref 150–400)
RBC: 4.06 MIL/uL (ref 3.87–5.11)
RDW: 14.5 % (ref 11.5–15.5)
WBC: 4.7 10*3/uL (ref 4.0–10.5)
nRBC: 0 % (ref 0.0–0.2)

## 2020-03-18 MED ORDER — SODIUM CHLORIDE 0.9 % IV SOLN: Freq: Once | INTRAVENOUS | Status: AC

## 2020-03-18 MED ORDER — HEPARIN SOD (PORK) LOCK FLUSH 100 UNIT/ML IV SOLN
500.0000 [IU] | Freq: Once | INTRAVENOUS | Status: AC | PRN
  Administered 2020-03-18: 10:00:00 500 [IU]

## 2020-03-18 MED ORDER — SODIUM CHLORIDE 0.9 % IV SOLN
10.0000 mg/kg | Freq: Once | INTRAVENOUS | Status: AC
  Administered 2020-03-18: 09:00:00 1240 mg via INTRAVENOUS
  Filled 2020-03-18: qty 20

## 2020-03-18 MED ORDER — SODIUM CHLORIDE 0.9% FLUSH
10.0000 mL | INTRAVENOUS | Status: DC | PRN
  Administered 2020-03-18: 10:00:00 10 mL

## 2020-03-18 NOTE — Progress Notes (Signed)
Patient presents today for Imfinzi infusion.  Vital signs within parameters for treatment.  Labs pending.  Patient has no new complaints since last visit.  Labs reviewed and within parameters for treatment.  Treatment given today per MD orders.  Tolerated infusion without adverse affects.  Vital signs stable.  No complaints at this time.  Discharge from clinic ambulatory in stable condition.  Alert and oriented X 3.  Follow up with [facility] as scheduled.

## 2020-03-18 NOTE — Patient Instructions (Signed)
[facility] Discharge Instructions for Patients Receiving Chemotherapy  Today you received the following chemotherapy agents   To help prevent nausea and vomiting after your treatment, we encourage you to take your nausea medication   If you develop nausea and vomiting that is not controlled by your nausea medication, call the clinic.   BELOW ARE SYMPTOMS THAT SHOULD BE REPORTED IMMEDIATELY:  *FEVER GREATER THAN 100.5 F  *CHILLS WITH OR WITHOUT FEVER  NAUSEA AND VOMITING THAT IS NOT CONTROLLED WITH YOUR NAUSEA MEDICATION  *UNUSUAL SHORTNESS OF BREATH  *UNUSUAL BRUISING OR BLEEDING  TENDERNESS IN MOUTH AND THROAT WITH OR WITHOUT PRESENCE OF ULCERS  *URINARY PROBLEMS  *BOWEL PROBLEMS  UNUSUAL RASH Items with * indicate a potential emergency and should be followed up as soon as possible.  Feel free to call the clinic should you have any questions or concerns. The clinic phone number is [phone].  Please show the CHEMO ALERT CARD at check-in to the Emergency Department and triage nurse.   

## 2020-03-31 ENCOUNTER — Other Ambulatory Visit: Payer: Self-pay

## 2020-03-31 ENCOUNTER — Inpatient Hospital Stay (HOSPITAL_COMMUNITY): Payer: Medicare Other

## 2020-03-31 ENCOUNTER — Inpatient Hospital Stay (HOSPITAL_BASED_OUTPATIENT_CLINIC_OR_DEPARTMENT_OTHER): Payer: Medicare Other | Admitting: Hematology

## 2020-03-31 ENCOUNTER — Inpatient Hospital Stay (HOSPITAL_COMMUNITY): Payer: Medicare Other | Attending: Hematology

## 2020-03-31 VITALS — BP 141/61 | HR 75 | Temp 96.8°F | Resp 20

## 2020-03-31 VITALS — BP 143/76 | HR 76 | Temp 96.8°F | Resp 20

## 2020-03-31 DIAGNOSIS — D329 Benign neoplasm of meninges, unspecified: Secondary | ICD-10-CM | POA: Insufficient documentation

## 2020-03-31 DIAGNOSIS — C3492 Malignant neoplasm of unspecified part of left bronchus or lung: Secondary | ICD-10-CM | POA: Diagnosis not present

## 2020-03-31 DIAGNOSIS — E119 Type 2 diabetes mellitus without complications: Secondary | ICD-10-CM | POA: Insufficient documentation

## 2020-03-31 DIAGNOSIS — C3412 Malignant neoplasm of upper lobe, left bronchus or lung: Secondary | ICD-10-CM | POA: Diagnosis not present

## 2020-03-31 DIAGNOSIS — Z5112 Encounter for antineoplastic immunotherapy: Secondary | ICD-10-CM | POA: Insufficient documentation

## 2020-03-31 DIAGNOSIS — I119 Hypertensive heart disease without heart failure: Secondary | ICD-10-CM | POA: Insufficient documentation

## 2020-03-31 DIAGNOSIS — M25562 Pain in left knee: Secondary | ICD-10-CM | POA: Insufficient documentation

## 2020-03-31 DIAGNOSIS — Z79899 Other long term (current) drug therapy: Secondary | ICD-10-CM | POA: Diagnosis not present

## 2020-03-31 DIAGNOSIS — M25561 Pain in right knee: Secondary | ICD-10-CM | POA: Diagnosis not present

## 2020-03-31 DIAGNOSIS — Z95828 Presence of other vascular implants and grafts: Secondary | ICD-10-CM

## 2020-03-31 LAB — CBC WITH DIFFERENTIAL/PLATELET
Abs Immature Granulocytes: 0.01 10*3/uL (ref 0.00–0.07)
Basophils Absolute: 0 10*3/uL (ref 0.0–0.1)
Basophils Relative: 1 %
Eosinophils Absolute: 0.1 10*3/uL (ref 0.0–0.5)
Eosinophils Relative: 2 %
HCT: 40.3 % (ref 36.0–46.0)
Hemoglobin: 12.3 g/dL (ref 12.0–15.0)
Immature Granulocytes: 0 %
Lymphocytes Relative: 16 %
Lymphs Abs: 0.7 10*3/uL (ref 0.7–4.0)
MCH: 27.3 pg (ref 26.0–34.0)
MCHC: 30.5 g/dL (ref 30.0–36.0)
MCV: 89.4 fL (ref 80.0–100.0)
Monocytes Absolute: 0.4 10*3/uL (ref 0.1–1.0)
Monocytes Relative: 9 %
Neutro Abs: 3.3 10*3/uL (ref 1.7–7.7)
Neutrophils Relative %: 72 %
Platelets: 369 10*3/uL (ref 150–400)
RBC: 4.51 MIL/uL (ref 3.87–5.11)
RDW: 14.6 % (ref 11.5–15.5)
WBC: 4.6 10*3/uL (ref 4.0–10.5)
nRBC: 0 % (ref 0.0–0.2)

## 2020-03-31 LAB — COMPREHENSIVE METABOLIC PANEL
ALT: 18 U/L (ref 0–44)
AST: 17 U/L (ref 15–41)
Albumin: 3.8 g/dL (ref 3.5–5.0)
Alkaline Phosphatase: 77 U/L (ref 38–126)
Anion gap: 8 (ref 5–15)
BUN: 14 mg/dL (ref 8–23)
CO2: 27 mmol/L (ref 22–32)
Calcium: 9 mg/dL (ref 8.9–10.3)
Chloride: 106 mmol/L (ref 98–111)
Creatinine, Ser: 0.72 mg/dL (ref 0.44–1.00)
GFR, Estimated: >60 mL/min (ref >60)
Glucose, Bld: 112 mg/dL — ABNORMAL HIGH (ref 70–99)
Potassium: 4.5 mmol/L (ref 3.5–5.1)
Sodium: 141 mmol/L (ref 135–145)
Total Bilirubin: 0.4 mg/dL (ref 0.3–1.2)
Total Protein: 6.8 g/dL (ref 6.5–8.1)

## 2020-03-31 LAB — TSH: TSH: 0.914 u[IU]/mL (ref 0.350–4.500)

## 2020-03-31 MED ORDER — HEPARIN SOD (PORK) LOCK FLUSH 100 UNIT/ML IV SOLN
500.0000 [IU] | Freq: Once | INTRAVENOUS | Status: AC | PRN
Start: 2020-03-31 — End: 2020-03-31
  Administered 2020-03-31: 500 [IU]

## 2020-03-31 MED ORDER — SODIUM CHLORIDE 0.9 % IV SOLN: Freq: Once | INTRAVENOUS | Status: AC

## 2020-03-31 MED ORDER — SODIUM CHLORIDE 0.9 % IV SOLN
10.0000 mg/kg | Freq: Once | INTRAVENOUS | Status: AC
  Administered 2020-03-31: 1240 mg via INTRAVENOUS
  Filled 2020-03-31: qty 20

## 2020-03-31 MED ORDER — SODIUM CHLORIDE 0.9% FLUSH
10.0000 mL | INTRAVENOUS | Status: DC | PRN
  Administered 2020-03-31: 10 mL

## 2020-03-31 NOTE — Progress Notes (Signed)
Patient tolerated therapy with no complaints voiced.  Side effects with management reviewed with understanding verbalized.  Port site clean and dry with no bruising or swelling noted at site.  Good blood return noted before and after administration of therapy.  Band aid applied.  Patient left in satisfactory condition with VSS and no s/s of distress noted.

## 2020-03-31 NOTE — Progress Notes (Signed)
[place] [address], [place] [address]   CLINIC:  Medical Oncology/Hematology  PCP:  Iona Beard, [address] / [place] [address] [phone]   REASON FOR VISIT:  Follow-up for stage III left lung adenocarcinoma  PRIOR THERAPY:  1. Chemoradiation with carboplatin and paclitaxel from 01/07/2019 to 02/11/2019. 2. Consolidation with durvalumab from 03/19/2019 to 04/16/2019, held due to pneumonitis.  NGS Results: Not done  CURRENT THERAPY: Consolidation with durvalumab every 2 weeks  BRIEF ONCOLOGIC HISTORY:  Oncology History  Adenocarcinoma of left lung ([facility])  12/09/2018 Initial Diagnosis   Adenocarcinoma of left lung ([facility])   01/02/2019 Cancer Staging   Staging form: Lung, AJCC 8th Edition - Clinical stage from 01/02/2019: Stage IIIB (cT3, cN2, cM0) - Signed by Derek Jack, MD on 01/02/2019   01/07/2019 - 02/11/2019 Chemotherapy   The patient had palonosetron (ALOXI) injection 0.25 mg, 0.25 mg, Intravenous,  Once, 6 of 6 cycles Administration: 0.25 mg (01/07/2019), 0.25 mg (01/14/2019), 0.25 mg (01/21/2019), 0.25 mg (01/28/2019), 0.25 mg (02/04/2019), 0.25 mg (02/11/2019) CARBOplatin (PARAPLATIN) 270 mg in sodium chloride 0.9 % 250 mL chemo infusion, 270 mg (100 % of original dose 266.4 mg), Intravenous,  Once, 6 of 6 cycles Dose modification:   (original dose 266.4 mg, Cycle 1),   (original dose 266.4 mg, Cycle 2),   (original dose 266.4 mg, Cycle 3),   (original dose 266.4 mg, Cycle 4) Administration: 270 mg (01/07/2019), 270 mg (01/14/2019), 270 mg (01/21/2019), 270 mg (01/28/2019), 270 mg (02/04/2019), 270 mg (02/11/2019) PACLitaxel (TAXOL) 108 mg in sodium chloride 0.9 % 250 mL chemo infusion (</= 80mg /m2), 45 mg/m2 = 108 mg, Intravenous,  Once, 6 of 6 cycles Administration: 108 mg (01/07/2019), 108 mg (01/14/2019), 108 mg (01/21/2019), 108 mg (01/28/2019), 108 mg (02/04/2019), 108 mg (02/11/2019) fosaprepitant (EMEND) 150  mg, dexamethasone (DECADRON) 12 mg in sodium chloride 0.9 % 145 mL IVPB, , Intravenous,  Once, 5 of 5 cycles Administration:  (01/14/2019),  (01/21/2019),  (01/28/2019),  (02/04/2019),  (02/11/2019)  for chemotherapy treatment.    03/19/2019 -  Chemotherapy    Patient is on Treatment Plan: LUNG DURVALUMAB Q14D        CANCER STAGING: Cancer Staging Adenocarcinoma of left lung [facility]) Staging form: Lung, AJCC 8th Edition - Clinical stage from 01/02/2019: Stage IIIB (cT3, cN2, cM0) - Signed by Derek Jack, MD on 01/02/2019   INTERVAL HISTORY:  [name], a 70 y.o. female, returns for routine follow-up and consideration for next cycle of immunotherapy. Denise [name] was last seen by Dr. Arletha Pili Iruku on 03/04/2020.  Due for cycle #22 of durvalumab today.   Overall, she tells me she has been feeling okay. She tolerated the previous treatment well and denies having any diarrhea. Her SOB is stable and she has not taken prednisone recently. She reports that she coughs up more sputum when she gets up in the morning. She denies having recent headaches or numbness on her left side. Her right knee continues hurting, while her left knee improved after getting an injection in July by Dr. Para March.  She is working in the office 3 days a week now; she plans on retiring in February.  Overall, she feels ready for next cycle of immunotherapy today.    REVIEW OF SYSTEMS:  Review of Systems  Constitutional: Positive for fatigue (25%). Negative for appetite change.  Respiratory: Positive for cough (productive most prominent in AM) and shortness of breath (stable).   Gastrointestinal: Negative  for diarrhea.  Musculoskeletal: Positive for arthralgias (chronic R knee pain; L knee improved).  Neurological: Negative for headaches and numbness.  All other systems reviewed and are negative.   PAST MEDICAL/SURGICAL HISTORY:  Past Medical History:  Diagnosis Date  . Anemia   . Aortic stenosis   .  Arthritis   . Cancer ([facility])    Lung  . Coronary artery calcification seen on CT scan   . Essential hypertension   . GERD (gastroesophageal reflux disease)   . History of lung cancer    Stage III adenocarcinoma status post chemoradiation  . Port-A-Cath in place 01/06/2019  . Type 2 diabetes mellitus ([place])    Past Surgical History:  Procedure Laterality Date  . CHOLECYSTECTOMY  1997  . COLONOSCOPY    . GASTRIC BYPASS    . INCISIONAL HERNIA REPAIR  04/11/11  . IR IMAGING GUIDED PORT INSERTION  12/27/2018   Right  . LAPAROSCOPIC SALPINGOOPHERECTOMY    . LAPAROTOMY  04/11/2011   Procedure: EXPLORATORY LAPAROTOMY;  Surgeon: Joyice Faster. Cornett, MD;  Location: [facility];  Service: General;  Laterality: N/A;  closure port hole  . RIGHT/LEFT HEART CATH AND CORONARY ANGIOGRAPHY N/A 12/29/2019   Procedure: RIGHT/LEFT HEART CATH AND CORONARY ANGIOGRAPHY;  Surgeon: Troy Sine, MD;  Location: [facility];  Service: Cardiovascular;  Laterality: N/A;  . TOTAL HIP ARTHROPLASTY  03/07/2012   Procedure: TOTAL HIP ARTHROPLASTY ANTERIOR APPROACH;  Surgeon: Mauri Pole, MD;  Location: [facility];  Service: Orthopedics;  Laterality: Right;  . TOTAL SHOULDER ARTHROPLASTY Left 01/21/2015  . TOTAL SHOULDER ARTHROPLASTY Left 01/21/2015   Procedure: LEFT TOTAL SHOULDER ARTHROPLASTY;  Surgeon: Justice Britain, MD;  Location: [facility];  Service: Orthopedics;  Laterality: Left;  Marland Kitchen VAGINAL HYSTERECTOMY      SOCIAL HISTORY:  Social History   Socioeconomic History  . Marital status: Single    Spouse name: Not on file  . Number of children: Not on file  . Years of education: 12th grade  . Highest education level: Not on file  Occupational History  . Occupation: Employed    Employer: [employer]  Tobacco Use  . Smoking status: Former Smoker    Packs/day: 1.00    Years: 12.00    Pack years: 12.00    Types: Cigarettes    Quit date: 03/20/1976    Years since quitting: 44.0  . Smokeless tobacco:  Never Used  Vaping Use  . Vaping Use: Never used  Substance and Sexual Activity  . Alcohol use: No  . Drug use: No  . Sexual activity: Not on file  Other Topics Concern  . Not on file  Social History Narrative  . Not on file   Social Determinants of Health   Financial Resource Strain: Not on file  Food Insecurity: Not on file  Transportation Needs: Not on file  Physical Activity: Not on file  Stress: Not on file  Social Connections: Not on file  Intimate Partner Violence: Not on file    FAMILY HISTORY:  Family History  Problem Relation Age of Onset  . Breast cancer Mother   . COPD Mother   . Arthritis Mother   . Diabetes Mother   . Hypertension Mother   . Hypertension Father   . Diabetes Father   . Breast cancer Sister   . Thyroid cancer Brother   . Huntington's disease Maternal Grandmother   . Heart attack Brother     CURRENT MEDICATIONS:  Current Outpatient Medications  Medication Sig Dispense Refill  . Acetaminophen (TYLENOL 8 HOUR ARTHRITIS PAIN PO) Take 650 ng by mouth 2 (two) times daily.     Marland Kitchen albuterol (VENTOLIN HFA) 108 (90 Base) MCG/ACT inhaler Inhale into the lungs.    Marland Kitchen amLODipine (NORVASC) 5 MG tablet Take 5 mg by mouth daily.     Marland Kitchen aspirin EC 81 MG tablet Take 81 mg by mouth daily. Swallow whole.    . Calcium Carbonate Antacid (TUMS PO) Take 4 tablets by mouth daily.    . carboxymethylcellul-glycerin (OPTIVE) 0.5-0.9 % ophthalmic solution Place 1-2 drops into both eyes 3 (three) times daily as needed (dry/irritated eyes.).    Marland Kitchen carvedilol (COREG) 3.125 MG tablet Take 1 tablet (3.125 mg total) by mouth 2 (two) times daily. 60 tablet 6  . Cyanocobalamin (VITAMIN B-12) 2500 MCG SUBL Place 2,500 mcg under the tongue every morning.     . diphenhydrAMINE (BENADRYL) 25 MG tablet Take 25 mg by mouth every 6 (six) hours as needed for itching or allergies.    Hunt Oris IV Inject into the vein every 14 (fourteen) days.    . ferrous sulfate 325 (65 FE) MG  tablet Take 325 mg by mouth every evening.    . furosemide (LASIX) 20 MG tablet TAKE 1 TABLET (20 MG TOTAL) BY MOUTH AS NEEDED FOR FLUID OR EDEMA (DAILY AS NEEDED FOR SWELLING). 90 tablet 1  . Multiple Vitamin (MULITIVITAMIN WITH MINERALS) TABS Take 1 tablet by mouth daily.    . naproxen (NAPROSYN) 500 MG tablet Take 500 mg by mouth every 12 (twelve) hours.     Marland Kitchen omeprazole (PRILOSEC) 20 MG capsule Take 20 mg by mouth daily.    Marland Kitchen HYDROcodone-acetaminophen (NORCO/VICODIN) 5-325 MG tablet Take 1 tablet by mouth 2 (two) times daily as needed. (Patient not taking: Reported on 03/31/2020)     Current Facility-Administered Medications  Medication Dose Route Frequency Provider Last Rate Last Admin  . sodium chloride flush (NS) 0.9 % injection 3 mL  3 mL Intravenous Q12H Satira Sark, MD        ALLERGIES:  No Known Allergies  PHYSICAL EXAM:  Performance status (ECOG): 1 - Symptomatic but completely ambulatory  Vitals:   03/31/20 0857  BP: (!) 143/76  Pulse: 76  Resp: 20  Temp: (!) 96.8 F (36 C)  SpO2: 100%   Wt Readings from Last 3 Encounters:  03/31/20 282 lb 6.4 oz (128.1 kg)  03/18/20 285 lb (129.3 kg)  03/04/20 281 lb 9.6 oz (127.7 kg)   Physical Exam Vitals reviewed.  Constitutional:      Appearance: Normal appearance. She is obese.  Cardiovascular:     Rate and Rhythm: Normal rate and regular rhythm.     Pulses: Normal pulses.     Heart sounds: Normal heart sounds.  Pulmonary:     Effort: Pulmonary effort is normal.     Breath sounds: Normal breath sounds.  Chest:     Comments: Port-a-Cath in R chest Musculoskeletal:     Right lower leg: Edema (trace) present.     Left lower leg: Edema (trace) present.  Neurological:     General: No focal deficit present.     Mental Status: She is alert and oriented to person, place, and time.  Psychiatric:        Mood and Affect: Mood normal.        Behavior: Behavior normal.     LABORATORY DATA:  I have reviewed the  labs as listed.  CBC Latest Ref Rng & Units 03/31/2020 03/18/2020 03/04/2020  WBC 4.0 - 10.5 K/uL 4.6 4.7 5.2  Hemoglobin 12.0 - 15.0 g/dL 12.3 11.1(L) 11.9(L)  Hematocrit 36.0 - 46.0 % 40.3 36.1 38.9  Platelets 150 - 400 K/uL 369 313 400   CMP Latest Ref Rng & Units 03/31/2020 03/18/2020 03/04/2020  Glucose 70 - 99 mg/dL 112(H) 109(H) 106(H)  BUN 8 - 23 mg/dL 14 12 11   Creatinine 0.44 - 1.00 mg/dL 0.72 0.65 0.71  Sodium 135 - 145 mmol/L 141 141 137  Potassium 3.5 - 5.1 mmol/L 4.5 3.6 3.8  Chloride 98 - 111 mmol/L 106 108 105  CO2 22 - 32 mmol/L 27 25 25   Calcium 8.9 - 10.3 mg/dL 9.0 8.4(L) 8.8(L)  Total Protein 6.5 - 8.1 g/dL 6.8 6.3(L) 6.6  Total Bilirubin 0.3 - 1.2 mg/dL 0.4 0.5 0.5  Alkaline Phos 38 - 126 U/L 77 76 75  AST 15 - 41 U/L 17 18 19   ALT 0 - 44 U/L 18 17 24     DIAGNOSTIC IMAGING:  I have independently reviewed the scans and discussed with the patient. CT Chest W Contrast  Result Date: 03/03/2020 CLINICAL DATA:  Lung cancer status post chemo radiation. EXAM: CT CHEST WITH CONTRAST TECHNIQUE: Multidetector CT imaging of the chest was performed during intravenous contrast administration. CONTRAST:  76mL OMNIPAQUE IOHEXOL 300 MG/ML  SOLN COMPARISON:  12/10/2019. FINDINGS: Cardiovascular: Right IJ Port-A-Cath terminates at the SVC RA junction. Atherosclerotic calcification of the aorta and aortic valve. Heart is enlarged. Small amount of pericardial fluid appears similar. Mediastinum/Nodes: No pathologically enlarged mediastinal, hilar or axillary lymph nodes. Esophagus is unremarkable. Lungs/Pleura: Nodular soft tissue in the posterior left upper lobe measures 1.6 x 2.0 cm (4/40), decreased slightly in size from 2.0 x 2.5 cm on 12/10/2019. Surrounding parenchymal retraction, bronchiectasis and architectural distortion, indicative of radiation therapy. Nonspecific mosaic attenuation in the right lower lobe, similar. No pleural fluid. Airway is unremarkable. Upper Abdomen: Biliary  ductal dilatation after cholecystectomy appears stable. 1.8 cm low-attenuation lesion in the left hepatic lobe is unchanged and likely a cyst. Slight nodular thickening of the adrenal glands. Visualized portions of the kidneys, spleen and pancreas are unremarkable. Postoperative changes in the stomach. No upper abdominal adenopathy. Musculoskeletal: Degenerative changes in the spine. Advanced degenerative changes in the right shoulder. Left shoulder arthroplasty. IMPRESSION: 1. Slight decrease in size of a posterior left upper lobe nodule with surrounding changes of radiation therapy. No evidence of metastatic disease. 2. Small amount of pericardial fluid, similar. 3.  Aortic atherosclerosis (ICD10-I70.0). Electronically Signed   By: Lorin Picket M.D.   On: 03/03/2020 10:49     ASSESSMENT:  1.Adenocarcinoma of left lung ([facility]) -Chemoradiation therapy with carboplatin and paclitaxel from 01/07/2019 through 02/11/2019. -Consolidation immunotherapy with durvalumab from 03/19/2019 through 04/15/2018, held due to pneumonitis. -CT chest on 07/02/2019 shows left upper lobe lung mass measuring 2.6 x 2.0 cm. It shows improvement in size. -MRI of the brain on 07/18/2019 showed left sphenoid wing meningioma measuring 2.6 x 2.0 x 2.6 cm unchanged. No new enhancing intracranial lesion. Increased left temporal white matter edema. -Durvalumab restarted on 07/24/2019. -We reviewed CT chest with contrast from 09/29/2019 which showed stable appearance of treated lung lesion within the posterior left upper lobe. New bandlike area of increased soft tissue density within the anterior to the treated lung lesion favored to be progressive changes from radiation. Local tumor recurrence is less favored. Stable pericardial effusion. -CT chest on 12/10/2019 shows posttreatment appearance  of the left lung with associated scarring and volume loss. No evidence of recurrence or metastatic disease. Unchanged small pericardial  effusion. -She was evaluated by cardiology with a stress test.  EF was 46%.  She underwent cardiac catheterization which did not show any abnormalities.   PLAN:  1. Clinical stage IIIb (T3N2) adenocarcinoma of the lung: -No immunotherapy related side effects noted. - Shortness of breath on exertion is at baseline. - Reviewed labs which showed normal LFTs and CBC.  TSH is 0.914. - Proceed with durvalumab every 2 weeks.  She will complete at end of April.  She will come back in 6 weeks for follow-up. - We will plan to repeat CT scans 3 months from the last.  2. Shortness of breath on exertion: -Continue follow-up with pulmonary.  This has been stable.  3.  Meningioma: - No headaches or other symptoms at this time.  4.  Hypertension: - Continue Norvasc.  Blood pressure is reasonably well controlled.  5.  Bilateral leg swelling: - Continue Lasix as needed.  6.  Bilateral knee pains, right more than left: - This is secondary to arthritis.  She follows up with Dr. Para March.    Orders placed this encounter:  No orders of the defined types were placed in this encounter.    Derek Jack, MD [place] [phone]   I, Milinda Antis, am acting as a scribe for Dr. Sanda Linger.  I, Derek Jack MD, have reviewed the above documentation for accuracy and completeness, and I agree with the above.

## 2020-03-31 NOTE — Patient Instructions (Addendum)
Denise [name] at [facility] Discharge Instructions  You were seen today by Dr. Delton Coombes. He went over your recent results and scans. You received your treatment today; continue getting your treatment every 2 weeks. Dr. Delton Coombes will see you back in 6 weeks for labs and follow up.   Thank you for choosing [place] at [facility] to provide your oncology and hematology care.  To afford each patient quality time with our provider, please arrive at least 15 minutes before your scheduled appointment time.   If you have a lab appointment with the [facility] please come in thru the Main Entrance and check in at the main information desk  You need to re-schedule your appointment should you arrive 10 or more minutes late.  We strive to give you quality time with our providers, and arriving late affects you and other patients whose appointments are after yours.  Also, if you no show three or more times for appointments you may be dismissed from the clinic at the providers discretion.     Again, thank you for choosing [facility].  Our hope is that these requests will decrease the amount of time that you wait before being seen by our physicians.       _____________________________________________________________  Should you have questions after your visit to [facility], please contact our office at [phone] between the hours of 8:00 a.m. and 4:30 p.m.  Voicemails left after 4:00 p.m. will not be returned until the following business day.  For prescription refill requests, have your pharmacy contact our office and allow 72 hours.    Cancer Center Support Programs:   > Cancer Support Group  2nd Tuesday of the month 1pm-2pm, Journey Room

## 2020-03-31 NOTE — Progress Notes (Signed)
Patient assessed and labs reviewed by Dr. Katragadda. Okay to proceed with treatment. Primary RN and pharmacy aware. 

## 2020-03-31 NOTE — Progress Notes (Signed)
Patients port flushed without difficulty.  No blood return noted with no bruising or swelling noted at site.  Transparent dressing applied.  Patient remains accessed for chemotherapy treatment.

## 2020-04-14 ENCOUNTER — Inpatient Hospital Stay (HOSPITAL_COMMUNITY): Payer: Medicare Other

## 2020-04-14 ENCOUNTER — Telehealth: Payer: Self-pay | Admitting: Internal Medicine

## 2020-04-14 ENCOUNTER — Other Ambulatory Visit: Payer: Self-pay

## 2020-04-14 VITALS — BP 129/61 | HR 83 | Temp 96.9°F | Resp 20 | Wt 285.0 lb

## 2020-04-14 DIAGNOSIS — I119 Hypertensive heart disease without heart failure: Secondary | ICD-10-CM | POA: Diagnosis not present

## 2020-04-14 DIAGNOSIS — M25561 Pain in right knee: Secondary | ICD-10-CM | POA: Diagnosis not present

## 2020-04-14 DIAGNOSIS — Z5112 Encounter for antineoplastic immunotherapy: Secondary | ICD-10-CM | POA: Diagnosis not present

## 2020-04-14 DIAGNOSIS — Z95828 Presence of other vascular implants and grafts: Secondary | ICD-10-CM

## 2020-04-14 DIAGNOSIS — D329 Benign neoplasm of meninges, unspecified: Secondary | ICD-10-CM | POA: Diagnosis not present

## 2020-04-14 DIAGNOSIS — C3492 Malignant neoplasm of unspecified part of left bronchus or lung: Secondary | ICD-10-CM

## 2020-04-14 DIAGNOSIS — C3412 Malignant neoplasm of upper lobe, left bronchus or lung: Secondary | ICD-10-CM | POA: Diagnosis not present

## 2020-04-14 DIAGNOSIS — E119 Type 2 diabetes mellitus without complications: Secondary | ICD-10-CM | POA: Diagnosis not present

## 2020-04-14 LAB — CBC WITH DIFFERENTIAL/PLATELET
Abs Immature Granulocytes: 0 10*3/uL (ref 0.00–0.07)
Basophils Absolute: 0 10*3/uL (ref 0.0–0.1)
Basophils Relative: 1 %
Eosinophils Absolute: 0.2 10*3/uL (ref 0.0–0.5)
Eosinophils Relative: 3 %
HCT: 39.9 % (ref 36.0–46.0)
Hemoglobin: 12 g/dL (ref 12.0–15.0)
Immature Granulocytes: 0 %
Lymphocytes Relative: 14 %
Lymphs Abs: 0.7 10*3/uL (ref 0.7–4.0)
MCH: 26.5 pg (ref 26.0–34.0)
MCHC: 30.1 g/dL (ref 30.0–36.0)
MCV: 88.1 fL (ref 80.0–100.0)
Monocytes Absolute: 0.4 10*3/uL (ref 0.1–1.0)
Monocytes Relative: 8 %
Neutro Abs: 3.6 10*3/uL (ref 1.7–7.7)
Neutrophils Relative %: 74 %
Platelets: 325 10*3/uL (ref 150–400)
RBC: 4.53 MIL/uL (ref 3.87–5.11)
RDW: 14.6 % (ref 11.5–15.5)
WBC: 4.9 10*3/uL (ref 4.0–10.5)
nRBC: 0 % (ref 0.0–0.2)

## 2020-04-14 LAB — COMPREHENSIVE METABOLIC PANEL
ALT: 20 U/L (ref 0–44)
AST: 17 U/L (ref 15–41)
Albumin: 3.7 g/dL (ref 3.5–5.0)
Alkaline Phosphatase: 77 U/L (ref 38–126)
Anion gap: 6 (ref 5–15)
BUN: 16 mg/dL (ref 8–23)
CO2: 26 mmol/L (ref 22–32)
Calcium: 9 mg/dL (ref 8.9–10.3)
Chloride: 108 mmol/L (ref 98–111)
Creatinine, Ser: 0.74 mg/dL (ref 0.44–1.00)
GFR, Estimated: >60 mL/min (ref >60)
Glucose, Bld: 108 mg/dL — ABNORMAL HIGH (ref 70–99)
Potassium: 4.1 mmol/L (ref 3.5–5.1)
Sodium: 140 mmol/L (ref 135–145)
Total Bilirubin: 0.4 mg/dL (ref 0.3–1.2)
Total Protein: 6.5 g/dL (ref 6.5–8.1)

## 2020-04-14 MED ORDER — HEPARIN SOD (PORK) LOCK FLUSH 100 UNIT/ML IV SOLN
500.0000 [IU] | Freq: Once | INTRAVENOUS | Status: AC | PRN
  Administered 2020-04-14: 500 [IU]

## 2020-04-14 MED ORDER — SODIUM CHLORIDE 0.9% FLUSH
10.0000 mL | INTRAVENOUS | Status: DC | PRN
  Administered 2020-04-14: 10 mL

## 2020-04-14 MED ORDER — SODIUM CHLORIDE 0.9 % IV SOLN: Freq: Once | INTRAVENOUS | Status: AC

## 2020-04-14 MED ORDER — SODIUM CHLORIDE 0.9 % IV SOLN
10.0000 mg/kg | Freq: Once | INTRAVENOUS | Status: AC
Start: 2020-04-14 — End: 2020-04-14
  Administered 2020-04-14: 1240 mg via INTRAVENOUS
  Filled 2020-04-14: qty 20

## 2020-04-14 NOTE — Progress Notes (Signed)
Patient presents today for Imfinzi infusion.  Vital signs within parameters for treatment.  Labs pending.  No new complaints since last visit.  Labs reviewed and within parameters for treatment.  Treatment given today per MD orders.  Tolerated infusion without adverse affects.  Vital signs stable.  No complaints at this time.  Discharge from clinic ambulatory in stable condition.  Alert and oriented X 3.  Follow up with [facility] as scheduled.

## 2020-04-14 NOTE — Patient Instructions (Signed)
[facility] Discharge Instructions for Patients Receiving Chemotherapy  Today you received the following chemotherapy agents   To help prevent nausea and vomiting after your treatment, we encourage you to take your nausea medication   If you develop nausea and vomiting that is not controlled by your nausea medication, call the clinic.   BELOW ARE SYMPTOMS THAT SHOULD BE REPORTED IMMEDIATELY:  *FEVER GREATER THAN 100.5 F  *CHILLS WITH OR WITHOUT FEVER  NAUSEA AND VOMITING THAT IS NOT CONTROLLED WITH YOUR NAUSEA MEDICATION  *UNUSUAL SHORTNESS OF BREATH  *UNUSUAL BRUISING OR BLEEDING  TENDERNESS IN MOUTH AND THROAT WITH OR WITHOUT PRESENCE OF ULCERS  *URINARY PROBLEMS  *BOWEL PROBLEMS  UNUSUAL RASH Items with * indicate a potential emergency and should be followed up as soon as possible.  Feel free to call the clinic should you have any questions or concerns. The clinic phone number is [phone].  Please show the CHEMO ALERT CARD at check-in to the Emergency Department and triage nurse.   

## 2020-04-14 NOTE — Telephone Encounter (Signed)
Rescheduled upcoming appointment per 1/26 schedule message. Patient is aware of changes.

## 2020-04-27 ENCOUNTER — Ambulatory Visit: Payer: Medicare Other | Admitting: Internal Medicine

## 2020-04-28 ENCOUNTER — Other Ambulatory Visit (HOSPITAL_COMMUNITY): Payer: Medicare Other

## 2020-04-28 ENCOUNTER — Ambulatory Visit (HOSPITAL_COMMUNITY): Payer: Medicare Other

## 2020-04-29 ENCOUNTER — Other Ambulatory Visit: Payer: Self-pay

## 2020-04-29 ENCOUNTER — Encounter (HOSPITAL_COMMUNITY): Payer: Self-pay

## 2020-04-29 ENCOUNTER — Inpatient Hospital Stay (HOSPITAL_COMMUNITY): Payer: Medicare Other | Attending: Hematology

## 2020-04-29 ENCOUNTER — Inpatient Hospital Stay (HOSPITAL_COMMUNITY): Payer: Medicare Other

## 2020-04-29 VITALS — BP 133/60 | HR 89 | Temp 97.0°F | Resp 18

## 2020-04-29 DIAGNOSIS — C3412 Malignant neoplasm of upper lobe, left bronchus or lung: Secondary | ICD-10-CM | POA: Insufficient documentation

## 2020-04-29 DIAGNOSIS — Z5112 Encounter for antineoplastic immunotherapy: Secondary | ICD-10-CM | POA: Diagnosis not present

## 2020-04-29 DIAGNOSIS — Z79899 Other long term (current) drug therapy: Secondary | ICD-10-CM | POA: Insufficient documentation

## 2020-04-29 DIAGNOSIS — Z95828 Presence of other vascular implants and grafts: Secondary | ICD-10-CM

## 2020-04-29 DIAGNOSIS — I10 Essential (primary) hypertension: Secondary | ICD-10-CM | POA: Insufficient documentation

## 2020-04-29 DIAGNOSIS — C3492 Malignant neoplasm of unspecified part of left bronchus or lung: Secondary | ICD-10-CM

## 2020-04-29 LAB — CBC WITH DIFFERENTIAL/PLATELET
Abs Immature Granulocytes: 0.01 10*3/uL (ref 0.00–0.07)
Basophils Absolute: 0 10*3/uL (ref 0.0–0.1)
Basophils Relative: 1 %
Eosinophils Absolute: 0.1 10*3/uL (ref 0.0–0.5)
Eosinophils Relative: 3 %
HCT: 37.2 % (ref 36.0–46.0)
Hemoglobin: 11.3 g/dL — ABNORMAL LOW (ref 12.0–15.0)
Immature Granulocytes: 0 %
Lymphocytes Relative: 13 %
Lymphs Abs: 0.6 10*3/uL — ABNORMAL LOW (ref 0.7–4.0)
MCH: 27 pg (ref 26.0–34.0)
MCHC: 30.4 g/dL (ref 30.0–36.0)
MCV: 88.8 fL (ref 80.0–100.0)
Monocytes Absolute: 0.4 10*3/uL (ref 0.1–1.0)
Monocytes Relative: 8 %
Neutro Abs: 3.6 10*3/uL (ref 1.7–7.7)
Neutrophils Relative %: 75 %
Platelets: 344 10*3/uL (ref 150–400)
RBC: 4.19 MIL/uL (ref 3.87–5.11)
RDW: 15.2 % (ref 11.5–15.5)
WBC: 4.9 10*3/uL (ref 4.0–10.5)
nRBC: 0 % (ref 0.0–0.2)

## 2020-04-29 LAB — COMPREHENSIVE METABOLIC PANEL
ALT: 18 U/L (ref 0–44)
AST: 19 U/L (ref 15–41)
Albumin: 3.5 g/dL (ref 3.5–5.0)
Alkaline Phosphatase: 73 U/L (ref 38–126)
Anion gap: 5 (ref 5–15)
BUN: 14 mg/dL (ref 8–23)
CO2: 27 mmol/L (ref 22–32)
Calcium: 8.8 mg/dL — ABNORMAL LOW (ref 8.9–10.3)
Chloride: 107 mmol/L (ref 98–111)
Creatinine, Ser: 0.64 mg/dL (ref 0.44–1.00)
GFR, Estimated: >60 mL/min (ref >60)
Glucose, Bld: 107 mg/dL — ABNORMAL HIGH (ref 70–99)
Potassium: 4 mmol/L (ref 3.5–5.1)
Sodium: 139 mmol/L (ref 135–145)
Total Bilirubin: 0.6 mg/dL (ref 0.3–1.2)
Total Protein: 6.5 g/dL (ref 6.5–8.1)

## 2020-04-29 LAB — TSH: TSH: 0.978 u[IU]/mL (ref 0.350–4.500)

## 2020-04-29 MED ORDER — HEPARIN SOD (PORK) LOCK FLUSH 100 UNIT/ML IV SOLN
500.0000 [IU] | Freq: Once | INTRAVENOUS | Status: AC | PRN
  Administered 2020-04-29: 500 [IU]

## 2020-04-29 MED ORDER — SODIUM CHLORIDE 0.9 % IV SOLN: Freq: Once | INTRAVENOUS | Status: AC

## 2020-04-29 MED ORDER — SODIUM CHLORIDE 0.9 % IV SOLN
10.0000 mg/kg | Freq: Once | INTRAVENOUS | Status: AC
  Administered 2020-04-29: 1240 mg via INTRAVENOUS
  Filled 2020-04-29: qty 20

## 2020-04-29 NOTE — Progress Notes (Signed)
Infusion tolerated without incident or complaint. VSS upon completion of treatment. Port flushed and deaccessed per protocol, see MAR and IV flowsheet for details. Discharged ambulatory in satisfactory condition with follow up instructions. 

## 2020-05-02 ENCOUNTER — Other Ambulatory Visit (HOSPITAL_COMMUNITY): Payer: Self-pay | Admitting: Hematology

## 2020-05-02 DIAGNOSIS — C3492 Malignant neoplasm of unspecified part of left bronchus or lung: Secondary | ICD-10-CM

## 2020-05-02 DIAGNOSIS — Z95828 Presence of other vascular implants and grafts: Secondary | ICD-10-CM

## 2020-05-03 ENCOUNTER — Encounter: Payer: Self-pay | Admitting: *Deleted

## 2020-05-04 ENCOUNTER — Inpatient Hospital Stay: Payer: Medicare Other | Admitting: Internal Medicine

## 2020-05-04 ENCOUNTER — Telehealth: Payer: Self-pay | Admitting: Internal Medicine

## 2020-05-04 NOTE — Telephone Encounter (Signed)
Called pt per 2/14 sch msg - pt is aware of aptp date and time

## 2020-05-12 ENCOUNTER — Other Ambulatory Visit (HOSPITAL_COMMUNITY): Payer: Medicare Other

## 2020-05-12 ENCOUNTER — Ambulatory Visit (HOSPITAL_COMMUNITY): Payer: Medicare Other | Admitting: Hematology

## 2020-05-12 ENCOUNTER — Ambulatory Visit (HOSPITAL_COMMUNITY): Payer: Medicare Other

## 2020-05-13 ENCOUNTER — Other Ambulatory Visit: Payer: Self-pay

## 2020-05-13 ENCOUNTER — Inpatient Hospital Stay (HOSPITAL_COMMUNITY): Payer: Medicare Other

## 2020-05-13 ENCOUNTER — Inpatient Hospital Stay (HOSPITAL_BASED_OUTPATIENT_CLINIC_OR_DEPARTMENT_OTHER): Payer: Medicare Other | Admitting: Hematology

## 2020-05-13 VITALS — BP 135/66 | HR 73 | Temp 96.9°F | Resp 20

## 2020-05-13 VITALS — BP 142/62 | HR 80 | Temp 96.9°F | Resp 19

## 2020-05-13 DIAGNOSIS — Z79899 Other long term (current) drug therapy: Secondary | ICD-10-CM | POA: Diagnosis not present

## 2020-05-13 DIAGNOSIS — C3492 Malignant neoplasm of unspecified part of left bronchus or lung: Secondary | ICD-10-CM

## 2020-05-13 DIAGNOSIS — Z95828 Presence of other vascular implants and grafts: Secondary | ICD-10-CM

## 2020-05-13 DIAGNOSIS — I10 Essential (primary) hypertension: Secondary | ICD-10-CM | POA: Diagnosis not present

## 2020-05-13 DIAGNOSIS — C3412 Malignant neoplasm of upper lobe, left bronchus or lung: Secondary | ICD-10-CM | POA: Diagnosis not present

## 2020-05-13 DIAGNOSIS — Z5112 Encounter for antineoplastic immunotherapy: Secondary | ICD-10-CM | POA: Diagnosis not present

## 2020-05-13 LAB — CBC WITH DIFFERENTIAL/PLATELET
Abs Immature Granulocytes: 0.01 10*3/uL (ref 0.00–0.07)
Basophils Absolute: 0 10*3/uL (ref 0.0–0.1)
Basophils Relative: 1 %
Eosinophils Absolute: 0.1 10*3/uL (ref 0.0–0.5)
Eosinophils Relative: 3 %
HCT: 38.5 % (ref 36.0–46.0)
Hemoglobin: 11.8 g/dL — ABNORMAL LOW (ref 12.0–15.0)
Immature Granulocytes: 0 %
Lymphocytes Relative: 16 %
Lymphs Abs: 0.8 10*3/uL (ref 0.7–4.0)
MCH: 26.8 pg (ref 26.0–34.0)
MCHC: 30.6 g/dL (ref 30.0–36.0)
MCV: 87.3 fL (ref 80.0–100.0)
Monocytes Absolute: 0.4 10*3/uL (ref 0.1–1.0)
Monocytes Relative: 9 %
Neutro Abs: 3.5 10*3/uL (ref 1.7–7.7)
Neutrophils Relative %: 71 %
Platelets: 334 10*3/uL (ref 150–400)
RBC: 4.41 MIL/uL (ref 3.87–5.11)
RDW: 14.9 % (ref 11.5–15.5)
WBC: 4.8 10*3/uL (ref 4.0–10.5)
nRBC: 0 % (ref 0.0–0.2)

## 2020-05-13 LAB — COMPREHENSIVE METABOLIC PANEL
ALT: 15 U/L (ref 0–44)
AST: 17 U/L (ref 15–41)
Albumin: 3.6 g/dL (ref 3.5–5.0)
Alkaline Phosphatase: 73 U/L (ref 38–126)
Anion gap: 9 (ref 5–15)
BUN: 12 mg/dL (ref 8–23)
CO2: 27 mmol/L (ref 22–32)
Calcium: 9.3 mg/dL (ref 8.9–10.3)
Chloride: 106 mmol/L (ref 98–111)
Creatinine, Ser: 0.72 mg/dL (ref 0.44–1.00)
GFR, Estimated: >60 mL/min (ref >60)
Glucose, Bld: 110 mg/dL — ABNORMAL HIGH (ref 70–99)
Potassium: 3.9 mmol/L (ref 3.5–5.1)
Sodium: 142 mmol/L (ref 135–145)
Total Bilirubin: 0.3 mg/dL (ref 0.3–1.2)
Total Protein: 6.4 g/dL — ABNORMAL LOW (ref 6.5–8.1)

## 2020-05-13 MED ORDER — HEPARIN SOD (PORK) LOCK FLUSH 100 UNIT/ML IV SOLN
500.0000 [IU] | Freq: Once | INTRAVENOUS | Status: AC | PRN
  Administered 2020-05-13: 500 [IU]

## 2020-05-13 MED ORDER — SODIUM CHLORIDE 0.9 % IV SOLN
10.0000 mg/kg | Freq: Once | INTRAVENOUS | Status: AC
  Administered 2020-05-13: 1240 mg via INTRAVENOUS
  Filled 2020-05-13: qty 20

## 2020-05-13 MED ORDER — SODIUM CHLORIDE 0.9% FLUSH
10.0000 mL | INTRAVENOUS | Status: DC | PRN
Start: 2020-05-13 — End: 2020-05-13
  Administered 2020-05-13: 10 mL

## 2020-05-13 MED ORDER — SODIUM CHLORIDE 0.9 % IV SOLN: Freq: Once | INTRAVENOUS | Status: AC

## 2020-05-13 NOTE — Progress Notes (Signed)
[place], [place] [address]   CLINIC:  Medical Oncology/Hematology  PCP:  Iona Beard, [address] / [place] [address] [phone]   REASON FOR VISIT:  Follow-up for stage III left lung adenocarcinoma  PRIOR THERAPY:  1. Chemoradiation with carboplatin and paclitaxel from 01/07/2019 to 02/11/2019. 2. Consolidation with durvalumab from 03/19/2019 to 04/16/2019, held due to pneumonitis.  NGS Results: Not done  CURRENT THERAPY: Consolidation with durvalumab every 2 weeks  BRIEF ONCOLOGIC HISTORY:  Oncology History  Adenocarcinoma of left lung ([facility])  12/09/2018 Initial Diagnosis   Adenocarcinoma of left lung ([facility])   01/02/2019 Cancer Staging   Staging form: Lung, AJCC 8th Edition - Clinical stage from 01/02/2019: Stage IIIB (cT3, cN2, cM0) - Signed by Derek Jack, MD on 01/02/2019   01/07/2019 - 02/11/2019 Chemotherapy   The patient had palonosetron (ALOXI) injection 0.25 mg, 0.25 mg, Intravenous,  Once, 6 of 6 cycles Administration: 0.25 mg (01/07/2019), 0.25 mg (01/14/2019), 0.25 mg (01/21/2019), 0.25 mg (01/28/2019), 0.25 mg (02/04/2019), 0.25 mg (02/11/2019) CARBOplatin (PARAPLATIN) 270 mg in sodium chloride 0.9 % 250 mL chemo infusion, 270 mg (100 % of original dose 266.4 mg), Intravenous,  Once, 6 of 6 cycles Dose modification:   (original dose 266.4 mg, Cycle 1),   (original dose 266.4 mg, Cycle 2),   (original dose 266.4 mg, Cycle 3),   (original dose 266.4 mg, Cycle 4) Administration: 270 mg (01/07/2019), 270 mg (01/14/2019), 270 mg (01/21/2019), 270 mg (01/28/2019), 270 mg (02/04/2019), 270 mg (02/11/2019) PACLitaxel (TAXOL) 108 mg in sodium chloride 0.9 % 250 mL chemo infusion (</= 80mg /m2), 45 mg/m2 = 108 mg, Intravenous,  Once, 6 of 6 cycles Administration: 108 mg (01/07/2019), 108 mg (01/14/2019), 108 mg (01/21/2019), 108 mg (01/28/2019), 108 mg (02/04/2019), 108 mg (02/11/2019) fosaprepitant (EMEND) 150  mg, dexamethasone (DECADRON) 12 mg in sodium chloride 0.9 % 145 mL IVPB, , Intravenous,  Once, 5 of 5 cycles Administration:  (01/14/2019),  (01/21/2019),  (01/28/2019),  (02/04/2019),  (02/11/2019)  for chemotherapy treatment.    03/19/2019 -  Chemotherapy    Patient is on Treatment Plan: LUNG DURVALUMAB Q14D        CANCER STAGING: Cancer Staging Adenocarcinoma of left lung [facility]) Staging form: Lung, AJCC 8th Edition - Clinical stage from 01/02/2019: Stage IIIB (cT3, cN2, cM0) - Signed by Derek Jack, MD on 01/02/2019   INTERVAL HISTORY:  Denise [name], a 70 y.o. female, returns for routine follow-up and consideration for next cycle of immunotherapy. Denise [name] was last seen on 03/31/2020.  Due for cycle #25 of durvalumab today.   Overall, she tells me she has been feeling good. Her SOB with minimal activity is stable and she denies having N/V, and occasional productive cough. She continues having pain in her knees from arthritis. She denies having abdominal pain when pressed, but when she stands up she notes having suprapubic pain.  She is back to working in the office. She will have an MRI brain on 02/25.  Overall, she feels ready for next cycle of immunotherapy today.    REVIEW OF SYSTEMS:  Review of Systems  Constitutional: Positive for fatigue (50%). Negative for appetite change.  Respiratory: Positive for cough (occasionally productive) and shortness of breath (w/ minimal exertion).   Gastrointestinal: Negative for nausea and vomiting.  All other systems reviewed and are negative.   PAST MEDICAL/SURGICAL HISTORY:  Past Medical History:  Diagnosis Date   Anemia    Aortic  stenosis    Arthritis    Cancer ([facility])    Lung   Coronary artery calcification seen on CT scan    Essential hypertension    GERD (gastroesophageal reflux disease)    History of lung cancer    Stage III adenocarcinoma status post chemoradiation   Port-A-Cath in place 01/06/2019    Type 2 diabetes mellitus [facility])    Past Surgical History:  Procedure Laterality Date   CHOLECYSTECTOMY  1997   COLONOSCOPY     GASTRIC BYPASS     INCISIONAL HERNIA REPAIR  04/11/11   IR IMAGING GUIDED PORT INSERTION  12/27/2018   Right   LAPAROSCOPIC SALPINGOOPHERECTOMY     LAPAROTOMY  04/11/2011   Procedure: EXPLORATORY LAPAROTOMY;  Surgeon: Joyice Faster. Cornett, MD;  Location: [facility];  Service: General;  Laterality: N/A;  closure port hole   RIGHT/LEFT HEART CATH AND CORONARY ANGIOGRAPHY N/A 12/29/2019   Procedure: RIGHT/LEFT HEART CATH AND CORONARY ANGIOGRAPHY;  Surgeon: Troy Sine, MD;  Location: [facility];  Service: Cardiovascular;  Laterality: N/A;   TOTAL HIP ARTHROPLASTY  03/07/2012   Procedure: TOTAL HIP ARTHROPLASTY ANTERIOR APPROACH;  Surgeon: Mauri Pole, MD;  Location: [facility];  Service: Orthopedics;  Laterality: Right;   TOTAL SHOULDER ARTHROPLASTY Left 01/21/2015   TOTAL SHOULDER ARTHROPLASTY Left 01/21/2015   Procedure: LEFT TOTAL SHOULDER ARTHROPLASTY;  Surgeon: Justice Britain, MD;  Location: [facility];  Service: Orthopedics;  Laterality: Left;   VAGINAL HYSTERECTOMY      SOCIAL HISTORY:  Social History   Socioeconomic History   Marital status: Single    Spouse name: Not on file   Number of children: Not on file   Years of education: 12th grade   Highest education level: Not on file  Occupational History   Occupation: Employed    Employer: [employer]  Tobacco Use   Smoking status: Former Smoker    Packs/day: 1.00    Years: 12.00    Pack years: 12.00    Types: Cigarettes    Quit date: 03/20/1976    Years since quitting: 44.1   Smokeless tobacco: Never Used  Vaping Use   Vaping Use: Never used  Substance and Sexual Activity   Alcohol use: No   Drug use: No   Sexual activity: Not on file  Other Topics Concern   Not on file  Social History Narrative   Not on file   Social Determinants of Health   Financial  Resource Strain: Not on file  Food Insecurity: Not on file  Transportation Needs: Not on file  Physical Activity: Not on file  Stress: Not on file  Social Connections: Not on file  Intimate Partner Violence: Not on file    FAMILY HISTORY:  Family History  Problem Relation Age of Onset   Breast cancer Mother    COPD Mother    Arthritis Mother    Diabetes Mother    Hypertension Mother    Hypertension Father    Diabetes Father    Breast cancer Sister    Thyroid cancer Brother    Huntington's disease Maternal Grandmother    Heart attack Brother     CURRENT MEDICATIONS:  Current Outpatient Medications  Medication Sig Dispense Refill   Acetaminophen (TYLENOL 8 HOUR ARTHRITIS PAIN PO) Take 650 ng by mouth 2 (two) times daily.      albuterol (VENTOLIN HFA) 108 (90 Base) MCG/ACT inhaler Inhale into the lungs.     amLODipine (NORVASC) 5 MG tablet  Take 5 mg by mouth daily.      aspirin EC 81 MG tablet Take 81 mg by mouth daily. Swallow whole.     Calcium Carbonate Antacid (TUMS PO) Take 4 tablets by mouth daily.     carboxymethylcellul-glycerin (OPTIVE) 0.5-0.9 % ophthalmic solution Place 1-2 drops into both eyes 3 (three) times daily as needed (dry/irritated eyes.).     Cyanocobalamin (VITAMIN B-12) 2500 MCG SUBL Place 2,500 mcg under the tongue every morning.      diphenhydrAMINE (BENADRYL) 25 MG tablet Take 25 mg by mouth every 6 (six) hours as needed for itching or allergies.     DURVALUMAB IV Inject into the vein every 14 (fourteen) days.     ferrous sulfate 325 (65 FE) MG tablet Take 325 mg by mouth every evening.     furosemide (LASIX) 20 MG tablet TAKE 1 TABLET (20 MG TOTAL) BY MOUTH AS NEEDED FOR FLUID OR EDEMA (DAILY AS NEEDED FOR SWELLING). 90 tablet 1   HYDROcodone-acetaminophen (NORCO/VICODIN) 5-325 MG tablet Take 1 tablet by mouth 2 (two) times daily as needed.     lidocaine-prilocaine (EMLA) cream APPLY SMALL AMOUNT TO PORT A CATH SITE AND COVER  WITH PLASTIC WRAP 1 HR PRIOR TO CHEMOTHERAPY APPTS 30 g 3   Multiple Vitamin (MULITIVITAMIN WITH MINERALS) TABS Take 1 tablet by mouth daily.     naproxen (NAPROSYN) 500 MG tablet Take 500 mg by mouth every 12 (twelve) hours.      omeprazole (PRILOSEC) 20 MG capsule Take 20 mg by mouth daily.     carvedilol (COREG) 3.125 MG tablet Take 1 tablet (3.125 mg total) by mouth 2 (two) times daily. 60 tablet 6   Current Facility-Administered Medications  Medication Dose Route Frequency Provider Last Rate Last Admin   sodium chloride flush (NS) 0.9 % injection 3 mL  3 mL Intravenous Q12H Satira Sark, MD        ALLERGIES:  No Known Allergies  PHYSICAL EXAM:  Performance status (ECOG): 1 - Symptomatic but completely ambulatory  Vitals:   05/13/20 0856  BP: 135/66  Pulse: 73  Resp: 20  Temp: (!) 96.9 F (36.1 C)  SpO2: 100%   Wt Readings from Last 3 Encounters:  04/29/20 283 lb 14.4 oz (128.8 kg)  04/14/20 285 lb (129.3 kg)  03/31/20 282 lb 6.4 oz (128.1 kg)   Physical Exam Vitals reviewed.  Constitutional:      Appearance: Normal appearance. She is obese.  Cardiovascular:     Rate and Rhythm: Normal rate and regular rhythm.     Pulses: Normal pulses.     Heart sounds: Normal heart sounds.  Pulmonary:     Effort: Pulmonary effort is normal.     Breath sounds: Normal breath sounds.  Chest:  Breasts:     Right: No supraclavicular adenopathy.     Left: No supraclavicular adenopathy.      Comments: Port-a-Cath in R chest Abdominal:     Palpations: Abdomen is soft. There is no mass.     Tenderness: There is no abdominal tenderness.  Musculoskeletal:     Right lower leg: Edema (chronic lymphedema) present.     Left lower leg: Edema (chronic lymphedema) present.  Lymphadenopathy:     Upper Body:     Right upper body: No supraclavicular adenopathy.     Left upper body: No supraclavicular adenopathy.  Neurological:     General: No focal deficit present.     Mental  Status: She is alert and  oriented to person, place, and time.  Psychiatric:        Mood and Affect: Mood normal.        Behavior: Behavior normal.     LABORATORY DATA:  I have reviewed the labs as listed.  CBC Latest Ref Rng & Units 05/13/2020 04/29/2020 04/14/2020  WBC 4.0 - 10.5 K/uL 4.8 4.9 4.9  Hemoglobin 12.0 - 15.0 g/dL 11.8(L) 11.3(L) 12.0  Hematocrit 36.0 - 46.0 % 38.5 37.2 39.9  Platelets 150 - 400 K/uL 334 344 325   CMP Latest Ref Rng & Units 05/13/2020 04/29/2020 04/14/2020  Glucose 70 - 99 mg/dL 110(H) 107(H) 108(H)  BUN 8 - 23 mg/dL 12 14 16   Creatinine 0.44 - 1.00 mg/dL 0.72 0.64 0.74  Sodium 135 - 145 mmol/L 142 139 140  Potassium 3.5 - 5.1 mmol/L 3.9 4.0 4.1  Chloride 98 - 111 mmol/L 106 107 108  CO2 22 - 32 mmol/L 27 27 26   Calcium 8.9 - 10.3 mg/dL 9.3 8.8(L) 9.0  Total Protein 6.5 - 8.1 g/dL 6.4(L) 6.5 6.5  Total Bilirubin 0.3 - 1.2 mg/dL 0.3 0.6 0.4  Alkaline Phos 38 - 126 U/L 73 73 77  AST 15 - 41 U/L 17 19 17   ALT 0 - 44 U/L 15 18 20     DIAGNOSTIC IMAGING:  I have independently reviewed the scans and discussed with the patient. No results found.   ASSESSMENT:  1.Adenocarcinoma of left lung ([facility]) -Chemoradiation therapy with carboplatin and paclitaxel from 01/07/2019 through 02/11/2019. -Consolidation immunotherapy with durvalumab from 03/19/2019 through 04/15/2018, held due to pneumonitis. -CT chest on 07/02/2019 shows left upper lobe lung mass measuring 2.6 x 2.0 cm. It shows improvement in size. -MRI of the brain on 07/18/2019 showed left sphenoid wing meningioma measuring 2.6 x 2.0 x 2.6 cm unchanged. No new enhancing intracranial lesion. Increased left temporal white matter edema. -Durvalumab restarted on 07/24/2019. -We reviewed CT chest with contrast from 09/29/2019 which showed stable appearance of treated lung lesion within the posterior left upper lobe. New bandlike area of increased soft tissue density within the anterior to the treated lung lesion  favored to be progressive changes from radiation. Local tumor recurrence is less favored. Stable pericardial effusion. -CT chest on 12/10/2019 shows posttreatment appearance of the left lung with associated scarring and volume loss. No evidence of recurrence or metastatic disease. Unchanged small pericardial effusion. -She was evaluated by cardiology with a stress test. EF was 46%. She underwent cardiac catheterization which did not show any abnormalities.   PLAN:  1. Clinical stage IIIb (T3N2) adenocarcinoma of the lung: -Denies any diarrhea, skin rashes, worsening shortness of breath or dry cough. -I have reviewed her labs today which showed normal LFTs and renal function. TSH was 0.97. -We will proceed with Durvalumab today and in 2 weeks. -Repeat CT of the chest with contrast in 4 weeks for follow-up.  2. Shortness of breath on exertion: -Continue follow-up with pulmonary. Continue inhalers.  3.  Meningioma: -No headaches or other symptoms. She has an MRI tomorrow.  4.  Hypertension: -Continue Norvasc. Blood pressure today is 135/66.  5.  Bilateral leg swelling: -Continue Lasix as needed.  6.  Bilateral knee pains, right more than left: - Bilateral osteoarthritis. Continue follow-up with Dr. Para March.   Orders placed this encounter:  No orders of the defined types were placed in this encounter.    Derek Jack, MD [facility] [phone]   I, Milinda Antis, am acting as a Education administrator for  Dr. Sanda Linger.  I, Derek Jack MD, have reviewed the above documentation for accuracy and completeness, and I agree with the above.

## 2020-05-13 NOTE — Progress Notes (Signed)
Patient was assessed by Dr. Katragadda and labs have been reviewed.  Patient is okay to proceed with treatment today. Primary RN and pharmacy aware.   

## 2020-05-13 NOTE — Patient Instructions (Signed)
[facility] Discharge Instructions for Patients Receiving Chemotherapy  Today you received the chemotherapy agents.  To help prevent nausea and vomiting after your treatment, we encourage you to take your nausea medication.   If you develop nausea and vomiting that is not controlled by your nausea medication, call the clinic.   BELOW ARE SYMPTOMS THAT SHOULD BE REPORTED IMMEDIATELY:  *FEVER GREATER THAN 100.5 F  *CHILLS WITH OR WITHOUT FEVER  NAUSEA AND VOMITING THAT IS NOT CONTROLLED WITH YOUR NAUSEA MEDICATION  *UNUSUAL SHORTNESS OF BREATH  *UNUSUAL BRUISING OR BLEEDING  TENDERNESS IN MOUTH AND THROAT WITH OR WITHOUT PRESENCE OF ULCERS  *URINARY PROBLEMS  *BOWEL PROBLEMS  UNUSUAL RASH Items with * indicate a potential emergency and should be followed up as soon as possible.  Feel free to call the clinic should you have any questions or concerns. The clinic phone number is [phone].  Please show the [facility] at check-in to the Emergency Department and triage nurse.

## 2020-05-13 NOTE — Patient Instructions (Signed)
[place] at [facility] Discharge Instructions  You were seen today by Dr. Delton Coombes. He went over your recent results. You received your treatment today; continue getting your treatment every 2 weeks. You will be scheduled to have a CT scan of your chest before your next visit. Dr. Delton Coombes will see you back in 4 weeks for labs and follow up.   Thank you for choosing [place] at [facility] to provide your oncology and hematology care.  To afford each patient quality time with our provider, please arrive at least 15 minutes before your scheduled appointment time.   If you have a lab appointment with the [facility] please come in thru the Main Entrance and check in at the main information desk  You need to re-schedule your appointment should you arrive 10 or more minutes late.  We strive to give you quality time with our providers, and arriving late affects you and other patients whose appointments are after yours.  Also, if you no show three or more times for appointments you may be dismissed from the clinic at the providers discretion.     Again, thank you for choosing [facility].  Our hope is that these requests will decrease the amount of time that you wait before being seen by our physicians.       _____________________________________________________________  Should you have questions after your visit to [facility], please contact our office at [phone] between the hours of 8:00 a.m. and 4:30 p.m.  Voicemails left after 4:00 p.m. will not be returned until the following business day.  For prescription refill requests, have your pharmacy contact our office and allow 72 hours.    Cancer Center Support Programs:   > Cancer Support Group  2nd Tuesday of the month 1pm-2pm, Journey Room

## 2020-05-13 NOTE — Progress Notes (Signed)
Patient presents today for treatment and follow up visit with Dr. Delton Coombes. Labs pending. Vital signs within parameters for treatment today. Patient denies any changes since her last treatment. Patient has generalized pain she rates a 3/10 that is chronic from arthritis. Patient has complaints of shortness of breath on exertion that is her normal per patient's words.   Message received from [facility] LPN/ Dr. Delton Coombes. Proceed with treatment.  Labs reviewed.   Treatment given today per MD orders. Tolerated infusion without adverse affects. Vital signs stable. No complaints at this time. Discharged from clinic ambulatory in stable condition. Alert and oriented x 3. F/U with [facility] as scheduled.

## 2020-05-14 ENCOUNTER — Ambulatory Visit (HOSPITAL_COMMUNITY)
Admission: RE | Admit: 2020-05-14 | Discharge: 2020-05-14 | Disposition: A | Discharge status: Home / Self Care | Payer: Medicare Other | Source: Ambulatory Visit | Attending: Internal Medicine | Admitting: Internal Medicine

## 2020-05-14 DIAGNOSIS — C719 Malignant neoplasm of brain, unspecified: Secondary | ICD-10-CM | POA: Diagnosis not present

## 2020-05-14 DIAGNOSIS — G936 Cerebral edema: Secondary | ICD-10-CM | POA: Diagnosis not present

## 2020-05-14 DIAGNOSIS — D329 Benign neoplasm of meninges, unspecified: Secondary | ICD-10-CM | POA: Insufficient documentation

## 2020-05-14 DIAGNOSIS — D496 Neoplasm of unspecified behavior of brain: Secondary | ICD-10-CM | POA: Diagnosis not present

## 2020-05-14 DIAGNOSIS — G935 Compression of brain: Secondary | ICD-10-CM | POA: Diagnosis not present

## 2020-05-14 IMAGING — MR MR HEAD WO/W CM
14 of 15 series · 38 of 48 positions shown · IV contrast (gadavist)
Comparison: MRI head [DATE].
COMPARISON: MRI head [DATE].

Addendum:
CLINICAL DATA: Brain/CNS neoplasm, assess treatment response.

EXAM:
MRI HEAD WITHOUT AND WITH CONTRAST
TECHNIQUE: Multiplanar, multiecho pulse sequences of the brain and surrounding
structures were obtained without and with intravenous contrast.
CONTRAST:  10mL GADAVIST GADOBUTROL 1 MMOL/ML IV SOLN

[Series 5: DWI · axial · 3.0mm · 0.77mm/px · z∈[-87,+51]mm · 2 of 50 slices shown (1 of 4)]
[im 1/50]
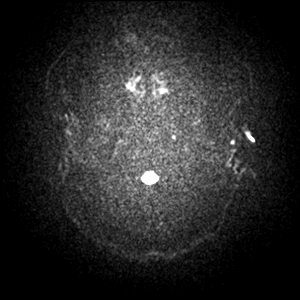
[im 50/50]
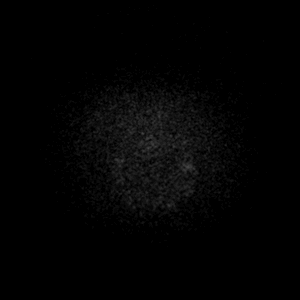

[Series 6: DWI · axial · 3.0mm · 0.77mm/px · z∈[-87,+51]mm · 3 of 50 slices shown (2 of 4)]
[im 1/50]
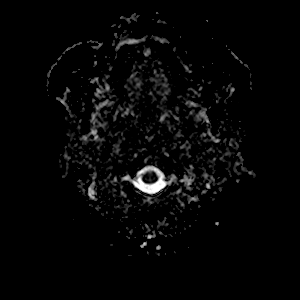
[im 25/50]
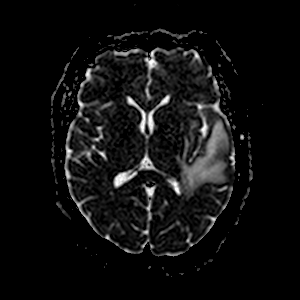
[im 50/50]
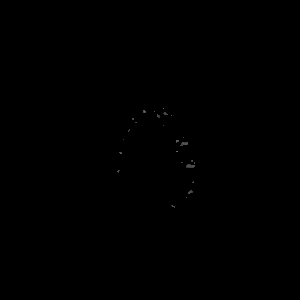

[Series 7: DWI · coronal · 5.0mm · 0.88mm/px · 2 of 30 slices shown (3 of 4)]
[im 1/30]
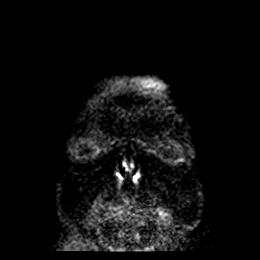
[im 30/30]
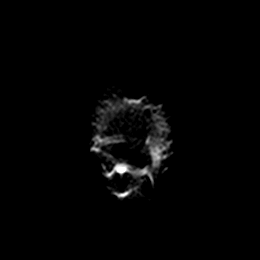

[Series 8: DWI · coronal · 5.0mm · 0.88mm/px · 2 of 30 slices shown (4 of 4)]
[im 1/30]
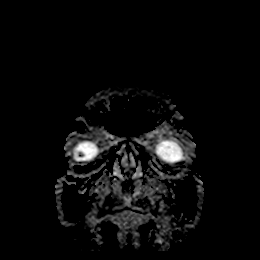
[im 30/30]
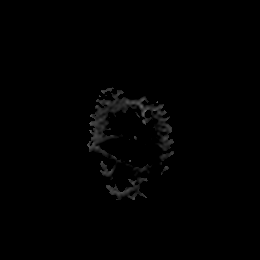

[Series 9: T1 · sagittal · 5.0mm · 0.75mm/px · 1 of 21 slices shown (1 of 2)]
[im 1/21]
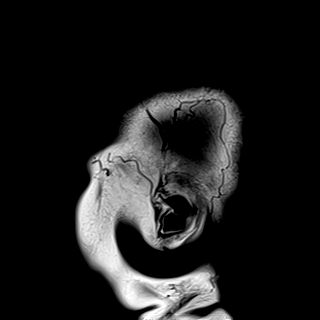

[Series 10: T2 · axial · 5.0mm · 0.72mm/px · 1 of 21 slices shown (1 of 2)]
[im 1/21]
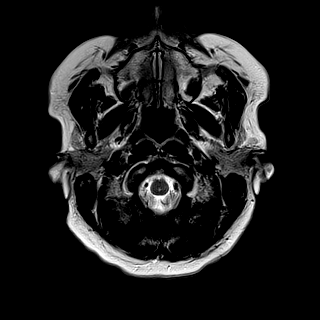

[Series 11: mag_images · axial · 3.0mm · 0.90mm/px · z∈[-93,+51]mm · 3 of 52 slices shown]
[im 1/52]
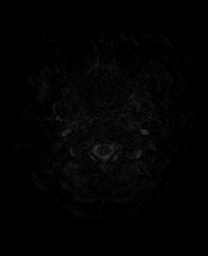
[im 26/52]
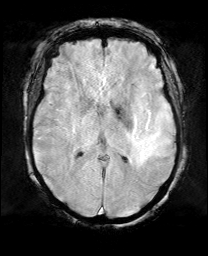
[im 52/52]
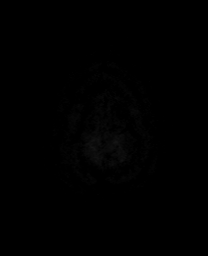

[Series 12: pha_images · axial · 3.0mm · 0.90mm/px · z∈[-93,+51]mm · 3 of 52 slices shown]
[im 1/52]
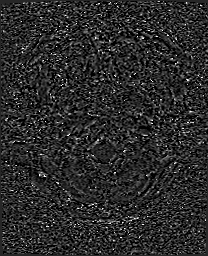
[im 26/52]
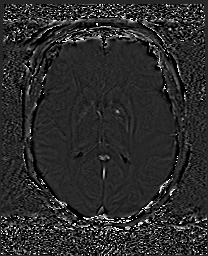
[im 52/52]
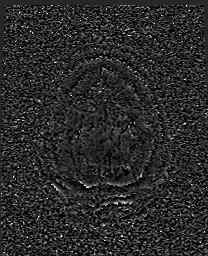

[Series 13: swi_images · axial · 3.0mm · 0.90mm/px · z∈[-93,+51]mm · 3 of 52 slices shown]
[im 1/52]
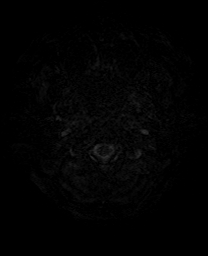
[im 26/52]
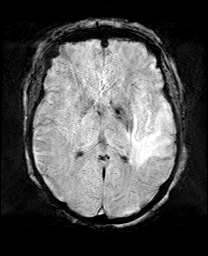
[im 52/52]
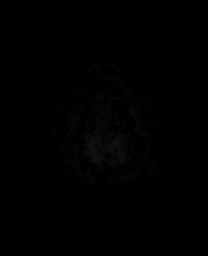

[Series 15: FLAIR · axial · 3.0mm · 0.45mm/px · z∈[-88,+51]mm · 3 of 50 slices shown]
[im 1/50]
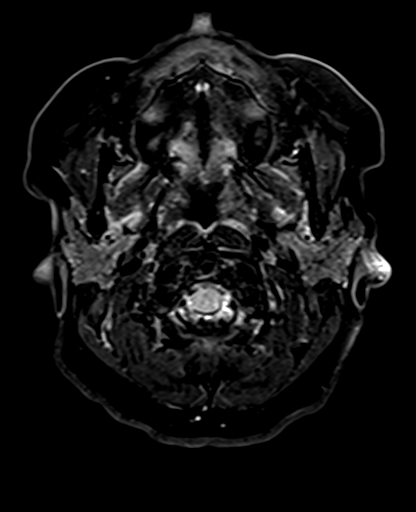
[im 25/50]
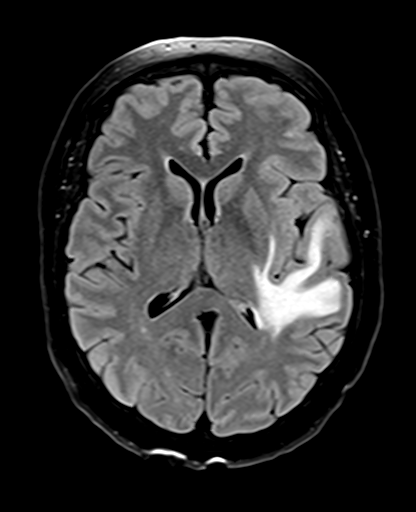
[im 50/50]
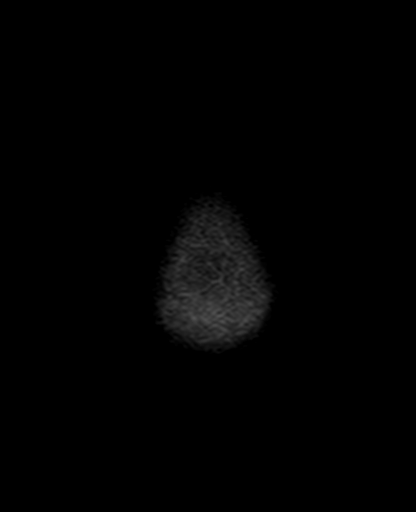

[Series 16: T1 · axial · 1.0mm · 0.98mm/px · z∈[-101,+49]mm · 10 of 160 slices shown (2 of 2)]
[im 1/160]
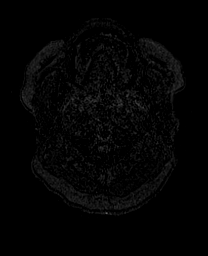
[im 18/160]
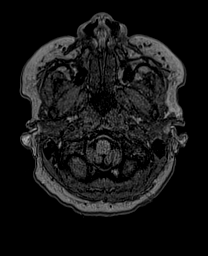
[im 36/160]
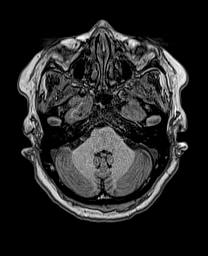
[im 54/160]
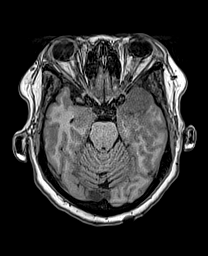
[im 71/160]
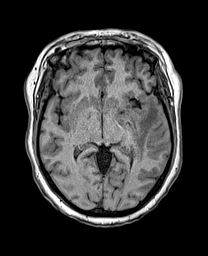
[im 89/160]
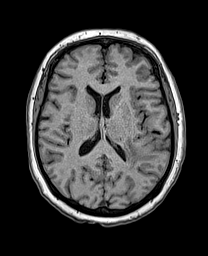
[im 107/160]
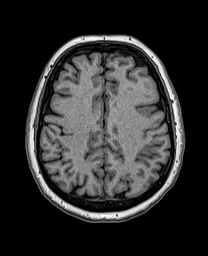
[im 124/160]
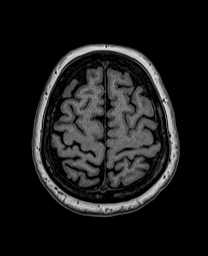
[im 142/160]
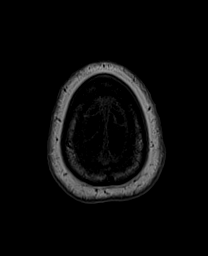
[im 160/160]
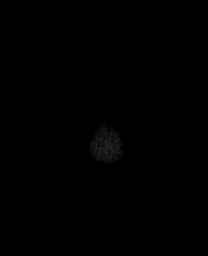

[Series 17: T2 · coronal · 5.0mm · 0.72mm/px · 2 of 30 slices shown (2 of 2)]
[im 1/30]
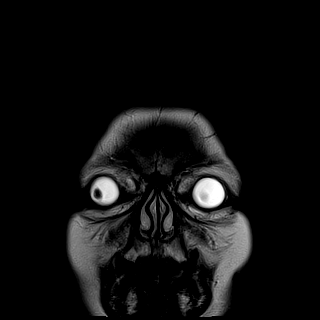
[im 30/30]
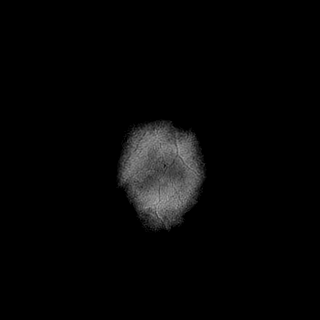

[Series 19: T1 post-contrast · coronal · 5.0mm · 0.34mm/px · 2 of 28 slices shown (1 of 2)]
[im 1/28]
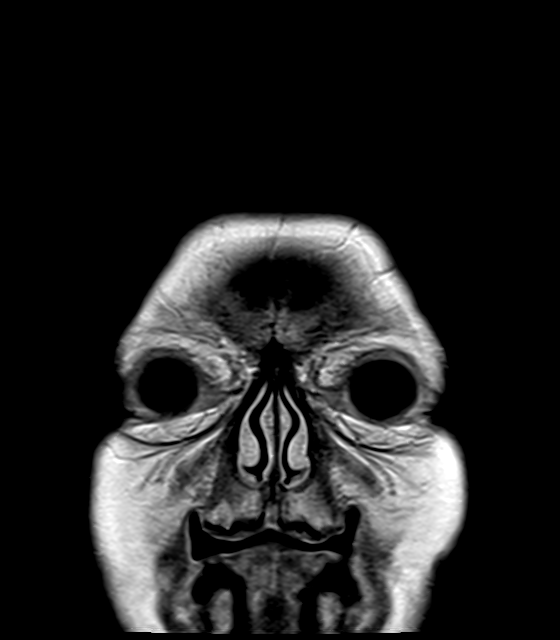
[im 28/28]
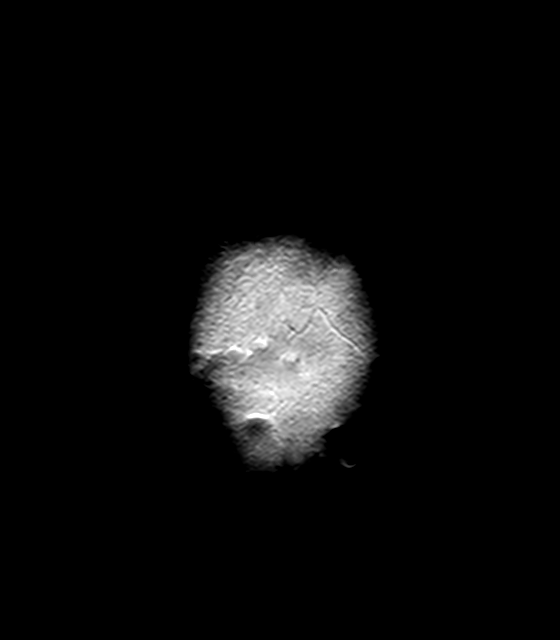

[Series 20: T1 post-contrast · sagittal · 5.0mm · 0.75mm/px · 1 of 21 slices shown (2 of 2)]
[im 1/21]
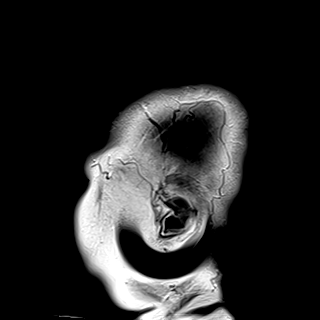

[38 of 48 positions shown; findings below may reference images not displayed]

FINDINGS: Brain: Increased size of the left sphenoid wing meningioma measuring
3.0 x 2.4 x 3.1 cm (previously 2.6 x 2.0 x 2.6 cm). Superiorly and
anteriorly, enhancing tumor extends slightly into the posterior
aspect of the anterior left cranial fossa (series 19, image 20).
Anteromedially, enhancing tumor abuts the left cavernous sinus.
There is slight enhancement of the adjacent left sphenoid wing,
suggesting an element of bony involvement. There is increased
surrounding vasogenic edema, which extends slightly farther
posteriorly in the temporal lobe. Similar extension of edema into
the white matter tracts of the left basal ganglia. There is slight
associated rightward uncal herniation. The basal cisterns and
suprasellar cistern are patent. Partial effacement of the temporal
horn and atrium of the left lateral ventricle without evidence of
hydrocephalus. No midline shift. No acute infarct. No acute
hemorrhage. No extra-axial fluid collection.

Vascular: Major arterial flow voids are maintained at the skull
base. Left MCA branches are inseparable from the mass.

Skull and upper cervical spine: Normal marrow signal.

Sinuses/Orbits: Clear sinuses.  Unremarkable orbits.

Other: No sizable mastoid effusions.
IMPRESSION: Increased size of the left sphenoid wing meningioma and increased
surrounding vasogenic edema, as detailed above.

ADDENDUM:
On further review, there is are two small (3-4 mm) enhancing lesions
in the left frontal cortex (series 18, image 92 and series 18, image
117) with subtle associated edema on FLAIR, concerning for
metastases in this patient with a history of lung cancer.

Findings discussed with Dr. MIRCIO at [DATE] p.m. on [DATE].

*** End of Addendum ***
FINDINGS: Brain: Increased size of the left sphenoid wing meningioma measuring
3.0 x 2.4 x 3.1 cm (previously 2.6 x 2.0 x 2.6 cm). Superiorly and
anteriorly, enhancing tumor extends slightly into the posterior
aspect of the anterior left cranial fossa (series 19, image 20).
Anteromedially, enhancing tumor abuts the left cavernous sinus.
There is slight enhancement of the adjacent left sphenoid wing,
suggesting an element of bony involvement. There is increased
surrounding vasogenic edema, which extends slightly farther
posteriorly in the temporal lobe. Similar extension of edema into
the white matter tracts of the left basal ganglia. There is slight
associated rightward uncal herniation. The basal cisterns and
suprasellar cistern are patent. Partial effacement of the temporal
horn and atrium of the left lateral ventricle without evidence of
hydrocephalus. No midline shift. No acute infarct. No acute
hemorrhage. No extra-axial fluid collection.

Vascular: Major arterial flow voids are maintained at the skull
base. Left MCA branches are inseparable from the mass.

Skull and upper cervical spine: Normal marrow signal.

Sinuses/Orbits: Clear sinuses.  Unremarkable orbits.

Other: No sizable mastoid effusions.
IMPRESSION: Increased size of the left sphenoid wing meningioma and increased
surrounding vasogenic edema, as detailed above.

## 2020-05-14 MED ORDER — GADOBUTROL 1 MMOL/ML IV SOLN
10.0000 mL | Freq: Once | INTRAVENOUS | Status: AC | PRN
  Administered 2020-05-14: 10 mL via INTRAVENOUS

## 2020-05-17 ENCOUNTER — Inpatient Hospital Stay: Payer: Medicare Other | Attending: Internal Medicine | Admitting: Internal Medicine

## 2020-05-17 ENCOUNTER — Other Ambulatory Visit: Payer: Self-pay

## 2020-05-17 ENCOUNTER — Other Ambulatory Visit: Payer: Self-pay | Admitting: Radiation Therapy

## 2020-05-17 VITALS — BP 141/72 | HR 81 | Temp 97.2°F | Resp 18 | Ht 62.0 in | Wt 285.5 lb

## 2020-05-17 DIAGNOSIS — C3492 Malignant neoplasm of unspecified part of left bronchus or lung: Secondary | ICD-10-CM | POA: Diagnosis not present

## 2020-05-17 DIAGNOSIS — D329 Benign neoplasm of meninges, unspecified: Secondary | ICD-10-CM

## 2020-05-17 DIAGNOSIS — Z803 Family history of malignant neoplasm of breast: Secondary | ICD-10-CM | POA: Insufficient documentation

## 2020-05-17 DIAGNOSIS — Z9884 Bariatric surgery status: Secondary | ICD-10-CM | POA: Diagnosis not present

## 2020-05-17 DIAGNOSIS — Z79899 Other long term (current) drug therapy: Secondary | ICD-10-CM | POA: Insufficient documentation

## 2020-05-17 DIAGNOSIS — Z833 Family history of diabetes mellitus: Secondary | ICD-10-CM | POA: Diagnosis not present

## 2020-05-17 DIAGNOSIS — Z808 Family history of malignant neoplasm of other organs or systems: Secondary | ICD-10-CM | POA: Insufficient documentation

## 2020-05-17 DIAGNOSIS — I2510 Atherosclerotic heart disease of native coronary artery without angina pectoris: Secondary | ICD-10-CM | POA: Insufficient documentation

## 2020-05-17 DIAGNOSIS — Z8261 Family history of arthritis: Secondary | ICD-10-CM | POA: Insufficient documentation

## 2020-05-17 DIAGNOSIS — Z8249 Family history of ischemic heart disease and other diseases of the circulatory system: Secondary | ICD-10-CM | POA: Insufficient documentation

## 2020-05-17 DIAGNOSIS — Z9049 Acquired absence of other specified parts of digestive tract: Secondary | ICD-10-CM | POA: Insufficient documentation

## 2020-05-17 DIAGNOSIS — Z836 Family history of other diseases of the respiratory system: Secondary | ICD-10-CM | POA: Insufficient documentation

## 2020-05-17 DIAGNOSIS — Z87891 Personal history of nicotine dependence: Secondary | ICD-10-CM | POA: Diagnosis not present

## 2020-05-17 DIAGNOSIS — D320 Benign neoplasm of cerebral meninges: Secondary | ICD-10-CM | POA: Insufficient documentation

## 2020-05-17 NOTE — Progress Notes (Signed)
[facility] [place], [place] [address] [phone]   Interval Evaluation  Date of Service: 05/17/20 Patient Name: Denise [name] Patient MRN: [id number] Patient DOB: 04-26-1950 Provider: Ventura Sellers, MD  Identifying Statement:  Denise [name] is a 70 y.o. female with left temporal meningioma   Oncologic History: Oncology History  Adenocarcinoma of left lung ([place])  12/09/2018 Initial Diagnosis   Adenocarcinoma of left lung ([place])   01/02/2019 Cancer Staging   Staging form: Lung, AJCC 8th Edition - Clinical stage from 01/02/2019: Stage IIIB (cT3, cN2, cM0) - Signed by Derek Jack, MD on 01/02/2019   01/07/2019 - 02/11/2019 Chemotherapy   The patient had palonosetron (ALOXI) injection 0.25 mg, 0.25 mg, Intravenous,  Once, 6 of 6 cycles Administration: 0.25 mg (01/07/2019), 0.25 mg (01/14/2019), 0.25 mg (01/21/2019), 0.25 mg (01/28/2019), 0.25 mg (02/04/2019), 0.25 mg (02/11/2019) CARBOplatin (PARAPLATIN) 270 mg in sodium chloride 0.9 % 250 mL chemo infusion, 270 mg (100 % of original dose 266.4 mg), Intravenous,  Once, 6 of 6 cycles Dose modification:   (original dose 266.4 mg, Cycle 1),   (original dose 266.4 mg, Cycle 2),   (original dose 266.4 mg, Cycle 3),   (original dose 266.4 mg, Cycle 4) Administration: 270 mg (01/07/2019), 270 mg (01/14/2019), 270 mg (01/21/2019), 270 mg (01/28/2019), 270 mg (02/04/2019), 270 mg (02/11/2019) PACLitaxel (TAXOL) 108 mg in sodium chloride 0.9 % 250 mL chemo infusion (</= 80mg /m2), 45 mg/m2 = 108 mg, Intravenous,  Once, 6 of 6 cycles Administration: 108 mg (01/07/2019), 108 mg (01/14/2019), 108 mg (01/21/2019), 108 mg (01/28/2019), 108 mg (02/04/2019), 108 mg (02/11/2019) fosaprepitant (EMEND) 150 mg, dexamethasone (DECADRON) 12 mg in sodium chloride 0.9 % 145 mL IVPB, , Intravenous,  Once, 5 of 5 cycles Administration:  (01/14/2019),  (01/21/2019),  (01/28/2019),  (02/04/2019),   (02/11/2019)  for chemotherapy treatment.    03/19/2019 -  Chemotherapy    Patient is on Treatment Plan: LUNG DURVALUMAB Q14D        Interval History:  Denise [name] presents to clinic today to review recent MRI brain.  The meningioma was uncovered in September 2020 after undergoing staging brain MRI for lung cancer.  No new or progressive neurologic deficits.  No seizures or headaches.    Medications: Current Outpatient Medications on File Prior to Visit  Medication Sig Dispense Refill  . Acetaminophen (TYLENOL 8 HOUR ARTHRITIS PAIN PO) Take 650 ng by mouth 2 (two) times daily.     Marland Kitchen albuterol (VENTOLIN HFA) 108 (90 Base) MCG/ACT inhaler Inhale into the lungs.    Marland Kitchen amLODipine (NORVASC) 5 MG tablet Take 5 mg by mouth daily.     Marland Kitchen aspirin EC 81 MG tablet Take 81 mg by mouth daily. Swallow whole.    . Calcium Carbonate Antacid (TUMS PO) Take 4 tablets by mouth daily.    . carboxymethylcellul-glycerin (OPTIVE) 0.5-0.9 % ophthalmic solution Place 1-2 drops into both eyes 3 (three) times daily as needed (dry/irritated eyes.).    Marland Kitchen carvedilol (COREG) 3.125 MG tablet Take 1 tablet (3.125 mg total) by mouth 2 (two) times daily. 60 tablet 6  . Cyanocobalamin (VITAMIN B-12) 2500 MCG SUBL Place 2,500 mcg under the tongue every morning.     . diphenhydrAMINE (BENADRYL) 25 MG tablet Take 25 mg by mouth every 6 (six) hours as needed for itching or allergies.    Hunt Oris IV Inject into the vein every 14 (fourteen) days.    . ferrous sulfate 325 (  65 FE) MG tablet Take 325 mg by mouth every evening.    . furosemide (LASIX) 20 MG tablet TAKE 1 TABLET (20 MG TOTAL) BY MOUTH AS NEEDED FOR FLUID OR EDEMA (DAILY AS NEEDED FOR SWELLING). 90 tablet 1  . HYDROcodone-acetaminophen (NORCO/VICODIN) 5-325 MG tablet Take 1 tablet by mouth 2 (two) times daily as needed.    . lidocaine-prilocaine (EMLA) cream APPLY SMALL AMOUNT TO PORT A CATH SITE AND COVER WITH PLASTIC WRAP 1 HR PRIOR TO CHEMOTHERAPY APPTS 30 g 3   . Multiple Vitamin (MULITIVITAMIN WITH MINERALS) TABS Take 1 tablet by mouth daily.    . naproxen (NAPROSYN) 500 MG tablet Take 500 mg by mouth every 12 (twelve) hours.     Marland Kitchen omeprazole (PRILOSEC) 20 MG capsule Take 20 mg by mouth daily.    . [DISCONTINUED] prochlorperazine (COMPAZINE) 10 MG tablet Take 1 tablet (10 mg total) by mouth every 6 (six) hours as needed (Nausea or vomiting). 30 tablet 1   Current Facility-Administered Medications on File Prior to Visit  Medication Dose Route Frequency Provider Last Rate Last Admin  . sodium chloride flush (NS) 0.9 % injection 3 mL  3 mL Intravenous Q12H Satira Sark, MD        Allergies: No Known Allergies Past Medical History:  Past Medical History:  Diagnosis Date  . Anemia   . Aortic stenosis   . Arthritis   . Cancer ([facility])    Lung  . Coronary artery calcification seen on CT scan   . Essential hypertension   . GERD (gastroesophageal reflux disease)   . History of lung cancer    Stage III adenocarcinoma status post chemoradiation  . Port-A-Cath in place 01/06/2019  . Type 2 diabetes mellitus ([facility])    Past Surgical History:  Past Surgical History:  Procedure Laterality Date  . CHOLECYSTECTOMY  1997  . COLONOSCOPY    . GASTRIC BYPASS    . INCISIONAL HERNIA REPAIR  04/11/11  . IR IMAGING GUIDED PORT INSERTION  12/27/2018   Right  . LAPAROSCOPIC SALPINGOOPHERECTOMY    . LAPAROTOMY  04/11/2011   Procedure: EXPLORATORY LAPAROTOMY;  Surgeon: Joyice Faster. Cornett, MD;  Location: [facility];  Service: General;  Laterality: N/A;  closure port hole  . RIGHT/LEFT HEART CATH AND CORONARY ANGIOGRAPHY N/A 12/29/2019   Procedure: RIGHT/LEFT HEART CATH AND CORONARY ANGIOGRAPHY;  Surgeon: Troy Sine, MD;  Location: [facility];  Service: Cardiovascular;  Laterality: N/A;  . TOTAL HIP ARTHROPLASTY  03/07/2012   Procedure: TOTAL HIP ARTHROPLASTY ANTERIOR APPROACH;  Surgeon: Mauri Pole, MD;  Location: [facility];  Service: Orthopedics;   Laterality: Right;  . TOTAL SHOULDER ARTHROPLASTY Left 01/21/2015  . TOTAL SHOULDER ARTHROPLASTY Left 01/21/2015   Procedure: LEFT TOTAL SHOULDER ARTHROPLASTY;  Surgeon: Justice Britain, MD;  Location: [facility];  Service: Orthopedics;  Laterality: Left;  Marland Kitchen VAGINAL HYSTERECTOMY     Social History:  Social History   Socioeconomic History  . Marital status: Single    Spouse name: Not on file  . Number of children: Not on file  . Years of education: 12th grade  . Highest education level: Not on file  Occupational History  . Occupation: Employed    Employer: [employer]  Tobacco Use  . Smoking status: Former Smoker    Packs/day: 1.00    Years: 12.00    Pack years: 12.00    Types: Cigarettes    Quit date: 03/20/1976    Years since quitting: 44.1  .  Smokeless tobacco: Never Used  Vaping Use  . Vaping Use: Never used  Substance and Sexual Activity  . Alcohol use: No  . Drug use: No  . Sexual activity: Not on file  Other Topics Concern  . Not on file  Social History Narrative  . Not on file   Social Determinants of Health   Financial Resource Strain: Not on file  Food Insecurity: Not on file  Transportation Needs: Not on file  Physical Activity: Not on file  Stress: Not on file  Social Connections: Not on file  Intimate Partner Violence: Not on file   Family History:  Family History  Problem Relation Age of Onset  . Breast cancer Mother   . COPD Mother   . Arthritis Mother   . Diabetes Mother   . Hypertension Mother   . Hypertension Father   . Diabetes Father   . Breast cancer Sister   . Thyroid cancer Brother   . Huntington's disease Maternal Grandmother   . Heart attack Brother     Review of Systems: Constitutional: Denies fevers, chills or abnormal weight loss Eyes: Denies blurriness of vision Ears, nose, mouth, throat, and face: Denies mucositis or sore throat Respiratory: Denies cough, dyspnea or wheezes Cardiovascular: Denies palpitation, chest  discomfort or lower extremity swelling Gastrointestinal:  Denies nausea, constipation, diarrhea GU: Denies dysuria or incontinence Skin: Denies abnormal skin rashes Neurological: Per HPI Musculoskeletal: Denies joint pain, back or neck discomfort. No decrease in ROM Behavioral/Psych: Denies anxiety, disturbance in thought content, and mood instability  Physical Exam: Vitals:   05/17/20 1018  BP: (!) 141/72  Pulse: 81  Resp: 18  Temp: (!) 97.2 F (36.2 C)  SpO2: 97%   KPS: 90. General: Alert, cooperative, pleasant, in no acute distress Head: Normal EENT: No conjunctival injection or scleral icterus. Oral mucosa moist Lungs: Resp effort normal Cardiac: Regular rate and rhythm Abdomen: Soft, non-distended abdomen Skin: No rashes cyanosis or petechiae. Extremities: No clubbing or edema  Neurologic Exam: Mental Status: Awake, alert, attentive to examiner. Oriented to self and environment. Language is fluent with intact comprehension.  Cranial Nerves: Visual acuity is grossly normal. Visual fields are full. Extra-ocular movements intact. No ptosis. Face is symmetric, tongue midline. Motor: Tone and bulk are normal. Power is full in both arms and legs. Reflexes are symmetric, no pathologic reflexes present. Intact finger to nose bilaterally Sensory: Intact to light touch and temperature Gait: Normal and tandem gait is deferred.   Labs: I have reviewed the data as listed    Component Value Date/Time   NA 142 05/13/2020 0902   K 3.9 05/13/2020 0902   CL 106 05/13/2020 0902   CO2 27 05/13/2020 0902   GLUCOSE 110 (H) 05/13/2020 0902   BUN 12 05/13/2020 0902   CREATININE 0.72 05/13/2020 0902   CALCIUM 9.3 05/13/2020 0902   PROT 6.4 (L) 05/13/2020 0902   ALBUMIN 3.6 05/13/2020 0902   AST 17 05/13/2020 0902   ALT 15 05/13/2020 0902   ALKPHOS 73 05/13/2020 0902   BILITOT 0.3 05/13/2020 0902   GFRNONAA >60 05/13/2020 0902   GFRAA >60 12/11/2019 0817   Lab Results  Component  Value Date   WBC 4.8 05/13/2020   NEUTROABS 3.5 05/13/2020   HGB 11.8 (L) 05/13/2020   HCT 38.5 05/13/2020   MCV 87.3 05/13/2020   PLT 334 05/13/2020    Imaging: [facility] Clinician Interpretation: I have personally reviewed the CNS images as listed.  My interpretation, in the context of  the patient's clinical presentation, is progressive disease  MR BRAIN W WO CONTRAST  Result Date: 05/14/2020 CLINICAL DATA:  Brain/CNS neoplasm, assess treatment response. EXAM: MRI HEAD WITHOUT AND WITH CONTRAST TECHNIQUE: Multiplanar, multiecho pulse sequences of the brain and surrounding structures were obtained without and with intravenous contrast. CONTRAST:  16mL GADAVIST GADOBUTROL 1 MMOL/ML IV SOLN COMPARISON:  MRI head April 30, 21. FINDINGS: Brain: Increased size of the left sphenoid wing meningioma measuring 3.0 x 2.4 x 3.1 cm (previously 2.6 x 2.0 x 2.6 cm). Superiorly and anteriorly, enhancing tumor extends slightly into the posterior aspect of the anterior left cranial fossa (series 19, image 20). Anteromedially, enhancing tumor abuts the left cavernous sinus. There is slight enhancement of the adjacent left sphenoid wing, suggesting an element of bony involvement. There is increased surrounding vasogenic edema, which extends slightly farther posteriorly in the temporal lobe. Similar extension of edema into the white matter tracts of the left basal ganglia. There is slight associated rightward uncal herniation. The basal cisterns and suprasellar cistern are patent. Partial effacement of the temporal horn and atrium of the left lateral ventricle without evidence of hydrocephalus. No midline shift. No acute infarct. No acute hemorrhage. No extra-axial fluid collection. Vascular: Major arterial flow voids are maintained at the skull base. Left MCA branches are inseparable from the mass. Skull and upper cervical spine: Normal marrow signal. Sinuses/Orbits: Clear sinuses.  Unremarkable orbits. Other: No sizable  mastoid effusions. IMPRESSION: Increased size of the left sphenoid wing meningioma and increased surrounding vasogenic edema, as detailed above. Electronically Signed   By: Margaretha Sheffield MD   On: 05/14/2020 18:13    Assessment/Plan Meningioma ([place]) [D32.9]   Denise [name] is clinically stable today.  Unfortunately, brain MRI demonstrates clear growth of L sphenoid wing meningioma.  There is some encroachment towards cavernous sinus and L optic nerve.    We reviewed treatment options with her, including surgical resection, fractionated radiosurgery, and continued observation.  She understands that if the lesion remains untreated it will continue to grow and compromise her vision and left sided cranial nerve pathways.    At this time, her preference is to defer surgery, but to move forward with radiosurgery.  She understands the risks, including tumor inflammation, tumor mutation, tumor growth through treatment.    We will reach out to radiation oncology team for consult and evaluation, with plan to treat in ~5 fractions.  She should follow up in 3 months following post-RT MRI study.  All questions were answered. The patient knows to call the clinic with any problems, questions or concerns. No barriers to learning were detected.  The total time spent in the encounter was 40 minutes and more than 50% was on counseling and review of test results   Ventura Sellers, MD Medical Director of Neuro-Oncology [facility] at [place] 05/17/20 10:03 AM

## 2020-05-17 NOTE — Progress Notes (Signed)
Location/Histology of Brain Tumor:  LEFT temporal meningioma   Patient presented with symptoms of:  meningioma was uncovered in September 2020 after undergoing staging brain MRI for lung cancer  Past or anticipated interventions, if any, per neurosurgery:  Patient is deferring surgical resection at this time  Past or anticipated interventions, if any, per medical oncology:  Under care of Dr. Cecil Cobbs 05/17/2020 --Denise [name] is clinically stable today.  Unfortunately, brain MRI demonstrates clear growth of L sphenoid wing meningioma.  There is some encroachment towards cavernous sinus and L optic nerve.   --We reviewed treatment options with her, including surgical resection, fractionated radiosurgery, and continued observation.  She understands that if the lesion remains untreated it will continue to grow and compromise her vision and left sided cranial nerve pathways.   --At this time, her preference is to defer surgery, but to move forward with radiosurgery.  She understands the risks, including tumor inflammation, tumor mutation, tumor growth through treatment.   --We will reach out to radiation oncology team for consult and evaluation, with plan to treat in ~5 fractions. --She should follow up in 3 months following post-RT MRI study  Under care of Dr. Derek Jack 05/13/2020 1.Adenocarcinoma of left lung ([facility]) -Chemoradiation therapy with carboplatin and paclitaxel from 01/07/2019 through 02/11/2019. -Consolidation immunotherapy with durvalumab from 03/19/2019 through 04/15/2018, held due to pneumonitis. -CT chest on 07/02/2019 shows left upper lobe lung mass measuring 2.6 x 2.0 cm. It shows improvement in size. -MRI of the brain on 07/18/2019 showed left sphenoid wing meningioma measuring 2.6 x 2.0 x 2.6 cm unchanged. No new enhancing intracranial lesion. Increased left temporal white matter edema. -Durvalumab restarted on 07/24/2019. -We reviewed CT chest with contrast  from 09/29/2019 which showed stable appearance of treated lung lesion within the posterior left upper lobe. New bandlike area of increased soft tissue density within the anterior to the treated lung lesion favored to be progressive changes from radiation. Local tumor recurrence is less favored. Stable pericardial effusion. -CT chest on 12/10/2019 shows posttreatment appearance of the left lung with associated scarring and volume loss. No evidence of recurrence or metastatic disease. Unchanged small pericardial effusion. -She was evaluated by cardiology with a stress test. EF was 46%. She underwent cardiac catheterization which did not show any abnormalities. PLAN:  1. Clinical stage IIIb (T3N2) adenocarcinoma of the lung: -Denies any diarrhea, skin rashes, worsening shortness of breath or dry cough. -I have reviewed her labs today which showed normal LFTs and renal function. TSH was 0.97. -We will proceed with Durvalumab today and in 2 weeks. -Repeat CT of the chest with contrast in 4 weeks for follow-up.  Dose of Decadron, if applicable: Not prescribed  Recent neurologic symptoms, if any:   Seizures: patient denies  Headaches: patient denies  Nausea: patient denies  Dizziness/ataxia: patient denies  Difficulty with hand coordination: patient denies  Focal numbness/weakness: patient denies  Visual deficits/changes: patient denies  Confusion/Memory deficits: patient denies anything not typical with aging   SAFETY ISSUES:  Prior radiation?  01/02/2019 through 02/11/2019 (Dr. Gery Pray) Site Technique Total Dose (Gy) Dose per Fx (Gy) Completed Fx Beam Energies  Thorax: Lung_Lt 3D 60/60 2 30/30 6X, 10X, 15X    Pacemaker/ICD? No  Possible current pregnancy? No-postmenopausal   Is the patient on methotrexate? No  Additional Complaints / other details: Scheduled for 3T MRI on 05/28/2020. Patient has received both Moderna vaccines as well as Moderna booster

## 2020-05-18 ENCOUNTER — Encounter: Payer: Self-pay | Admitting: Radiation Oncology

## 2020-05-18 ENCOUNTER — Ambulatory Visit
Admission: RE | Admit: 2020-05-18 | Discharge: 2020-05-18 | Disposition: A | Discharge status: Home / Self Care | Payer: Medicare Other | Source: Ambulatory Visit | Attending: Radiation Oncology | Admitting: Radiation Oncology

## 2020-05-18 DIAGNOSIS — D329 Benign neoplasm of meninges, unspecified: Secondary | ICD-10-CM

## 2020-05-18 DIAGNOSIS — D320 Benign neoplasm of cerebral meninges: Secondary | ICD-10-CM | POA: Insufficient documentation

## 2020-05-18 DIAGNOSIS — I10 Essential (primary) hypertension: Secondary | ICD-10-CM | POA: Diagnosis not present

## 2020-05-18 DIAGNOSIS — Z6841 Body Mass Index (BMI) 40.0 and over, adult: Secondary | ICD-10-CM | POA: Diagnosis not present

## 2020-05-18 NOTE — Progress Notes (Signed)
Radiation Oncology         [phone] ________________________________  Initial Outpatient Consultation by telephone.  The patient opted for telemedicine to maximize safety during the pandemic.  MyChart video was not obtainable.  Name: Denise [name] MRN: [id number]  Date: 05/18/2020  DOB: [birth year]  XM:IWOE, Berneta Sages, MD  Iona Beard, MD   REFERRING PHYSICIAN: Iona Beard, MD  DIAGNOSIS:     ICD-10-CM   1. Meningioma ([place])  D32.9   2. Meningioma, cerebral ([place])  D32.0      HISTORY OF PRESENT ILLNESS::Denise [name] is a 70 y.o. female who has been under the close care of Dr. Delton Coombes for history of left lung cancer.  In 2020, the patient was diagnosed with clinical stage IIIB (T3, N2) adenocarcinoma of the left lung and was treated with chemoradiation therapy with Carboplatin and Paclitaxel from 01/07/2019 - 02/10/2021. See below for radiation therapy details. She was then treated with consolidation immunotherapy with Durvalumab from 03/19/2019 - 04/16/2019, held due to pneumonitis, and restarted on 07/24/2019.  During her initial staging work-up, she underwent a brian MRI on 12/16/2018 that revealed a 1.8 x 2.6 x 2.6 cm homogeneously enhancing extra-axial mass along the left greater wing of the sphenoid. Imaging appearance was most suggestive of a meningioma.  The patient has undergone serial imaging to follow-up on the meningioma, all of which remained relatively stable until the most recent study. Most recent MRI of the brain on 05/14/2020 showed an increase in the size of the left sphenoid wing meningioma and increased surrounding vasogenic edema.  Recent neurologic symptoms, if any:   Seizures: patient denies  Headaches: patient denies  Nausea: patient denies  Dizziness/ataxia: patient denies  Difficulty with hand coordination: patient denies  Focal numbness/weakness: patient denies  Visual deficits/changes: patient denies  Confusion/Memory deficits: patient denies  anything not typical with aging   Has a pending consult with [facility], Dr. Christella Noa.  PREVIOUS RADIATION THERAPY: Yes - Dr. Sondra Come  Radiation Treatment Dates: 01/02/2019 - 02/11/2019:  Site: Thorax, left lung Technique: 3D Total Dose (Gy): 60/60 Dose per Fx (Gy): 1 Completed Fx: 30/30 Beam Energies: 6X, 10X, 15X  PAST MEDICAL HISTORY:  has a past medical history of Anemia, Aortic stenosis, Arthritis, Cancer ([place]), Coronary artery calcification seen on CT scan, Essential hypertension, GERD (gastroesophageal reflux disease), History of lung cancer, Port-A-Cath in place (01/06/2019), and Type 2 diabetes mellitus ([place]).    PAST SURGICAL HISTORY: Past Surgical History:  Procedure Laterality Date  . CHOLECYSTECTOMY  1997  . COLONOSCOPY    . GASTRIC BYPASS    . INCISIONAL HERNIA REPAIR  04/11/11  . IR IMAGING GUIDED PORT INSERTION  12/27/2018   Right  . LAPAROSCOPIC SALPINGOOPHERECTOMY    . LAPAROTOMY  04/11/2011   Procedure: EXPLORATORY LAPAROTOMY;  Surgeon: Joyice Faster. Cornett, MD;  Location: [facility];  Service: General;  Laterality: N/A;  closure port hole  . RIGHT/LEFT HEART CATH AND CORONARY ANGIOGRAPHY N/A 12/29/2019   Procedure: RIGHT/LEFT HEART CATH AND CORONARY ANGIOGRAPHY;  Surgeon: Troy Sine, MD;  Location: [facility];  Service: Cardiovascular;  Laterality: N/A;  . TOTAL HIP ARTHROPLASTY  03/07/2012   Procedure: TOTAL HIP ARTHROPLASTY ANTERIOR APPROACH;  Surgeon: Mauri Pole, MD;  Location: [facility];  Service: Orthopedics;  Laterality: Right;  . TOTAL SHOULDER ARTHROPLASTY Left 01/21/2015  . TOTAL SHOULDER ARTHROPLASTY Left 01/21/2015   Procedure: LEFT TOTAL SHOULDER ARTHROPLASTY;  Surgeon: Justice Britain, MD;  Location: [facility];  Service: Orthopedics;  Laterality: Left;  .  VAGINAL HYSTERECTOMY      FAMILY HISTORY: family history includes Arthritis in her mother; Breast cancer in her mother and sister; COPD in her mother; Diabetes in her father and mother; Heart attack in her  brother; Huntington's disease in her maternal grandmother; Hypertension in her father and mother; Thyroid cancer in her brother.  SOCIAL HISTORY:  reports that she quit smoking about 44 years ago. Her smoking use included cigarettes. She has a 12.00 pack-year smoking history. She has never used smokeless tobacco. She reports that she does not drink alcohol and does not use drugs.  ALLERGIES: Patient has no known allergies.  MEDICATIONS:  Current Outpatient Medications  Medication Sig Dispense Refill  . Acetaminophen (TYLENOL 8 HOUR ARTHRITIS PAIN PO) Take 650 ng by mouth 2 (two) times daily.     Marland Kitchen albuterol (VENTOLIN HFA) 108 (90 Base) MCG/ACT inhaler Inhale into the lungs.    Marland Kitchen amLODipine (NORVASC) 5 MG tablet Take 5 mg by mouth daily.     Marland Kitchen aspirin EC 81 MG tablet Take 81 mg by mouth daily. Swallow whole.    . Calcium Carbonate Antacid (TUMS PO) Take 4 tablets by mouth daily.    . carboxymethylcellul-glycerin (OPTIVE) 0.5-0.9 % ophthalmic solution Place 1-2 drops into both eyes 3 (three) times daily as needed (dry/irritated eyes.).    Marland Kitchen carvedilol (COREG) 3.125 MG tablet Take 1 tablet (3.125 mg total) by mouth 2 (two) times daily. 60 tablet 6  . Cyanocobalamin (VITAMIN B-12) 2500 MCG SUBL Place 2,500 mcg under the tongue every morning.     . diphenhydrAMINE (BENADRYL) 25 MG tablet Take 25 mg by mouth every 6 (six) hours as needed for itching or allergies.    Hunt Oris IV Inject into the vein every 14 (fourteen) days.    . ferrous sulfate 325 (65 FE) MG tablet Take 325 mg by mouth every evening.    . furosemide (LASIX) 20 MG tablet TAKE 1 TABLET (20 MG TOTAL) BY MOUTH AS NEEDED FOR FLUID OR EDEMA (DAILY AS NEEDED FOR SWELLING). 90 tablet 1  . HYDROcodone-acetaminophen (NORCO/VICODIN) 5-325 MG tablet Take 1 tablet by mouth 2 (two) times daily as needed.    . lidocaine-prilocaine (EMLA) cream APPLY SMALL AMOUNT TO PORT A CATH SITE AND COVER WITH PLASTIC WRAP 1 HR PRIOR TO CHEMOTHERAPY APPTS  30 g 3  . Multiple Vitamin (MULITIVITAMIN WITH MINERALS) TABS Take 1 tablet by mouth daily.    . naproxen (NAPROSYN) 500 MG tablet Take 500 mg by mouth every 12 (twelve) hours.     Marland Kitchen omeprazole (PRILOSEC) 20 MG capsule Take 20 mg by mouth daily.     Current Facility-Administered Medications  Medication Dose Route Frequency Provider Last Rate Last Admin  . sodium chloride flush (NS) 0.9 % injection 3 mL  3 mL Intravenous Q12H Satira Sark, MD        REVIEW OF SYSTEMS:  As above.   PHYSICAL EXAM:  vitals were not taken for this visit.   General: Alert and oriented, in no acute distress    LABORATORY DATA:  Lab Results  Component Value Date   WBC 4.8 05/13/2020   HGB 11.8 (L) 05/13/2020   HCT 38.5 05/13/2020   MCV 87.3 05/13/2020   PLT 334 05/13/2020   CMP     Component Value Date/Time   NA 142 05/13/2020 0902   K 3.9 05/13/2020 0902   CL 106 05/13/2020 0902   CO2 27 05/13/2020 0902   GLUCOSE 110 (H) 05/13/2020 0902  BUN 12 05/13/2020 0902   CREATININE 0.72 05/13/2020 0902   CALCIUM 9.3 05/13/2020 0902   PROT 6.4 (L) 05/13/2020 0902   ALBUMIN 3.6 05/13/2020 0902   AST 17 05/13/2020 0902   ALT 15 05/13/2020 0902   ALKPHOS 73 05/13/2020 0902   BILITOT 0.3 05/13/2020 0902   GFRNONAA >60 05/13/2020 0902   GFRAA >60 12/11/2019 0817         RADIOGRAPHY: MR BRAIN W WO CONTRAST  Result Date: 05/14/2020 CLINICAL DATA:  Brain/CNS neoplasm, assess treatment response. EXAM: MRI HEAD WITHOUT AND WITH CONTRAST TECHNIQUE: Multiplanar, multiecho pulse sequences of the brain and surrounding structures were obtained without and with intravenous contrast. CONTRAST:  11mL GADAVIST GADOBUTROL 1 MMOL/ML IV SOLN COMPARISON:  MRI head April 30, 21. FINDINGS: Brain: Increased size of the left sphenoid wing meningioma measuring 3.0 x 2.4 x 3.1 cm (previously 2.6 x 2.0 x 2.6 cm). Superiorly and anteriorly, enhancing tumor extends slightly into the posterior aspect of the anterior left  cranial fossa (series 19, image 20). Anteromedially, enhancing tumor abuts the left cavernous sinus. There is slight enhancement of the adjacent left sphenoid wing, suggesting an element of bony involvement. There is increased surrounding vasogenic edema, which extends slightly farther posteriorly in the temporal lobe. Similar extension of edema into the white matter tracts of the left basal ganglia. There is slight associated rightward uncal herniation. The basal cisterns and suprasellar cistern are patent. Partial effacement of the temporal horn and atrium of the left lateral ventricle without evidence of hydrocephalus. No midline shift. No acute infarct. No acute hemorrhage. No extra-axial fluid collection. Vascular: Major arterial flow voids are maintained at the skull base. Left MCA branches are inseparable from the mass. Skull and upper cervical spine: Normal marrow signal. Sinuses/Orbits: Clear sinuses.  Unremarkable orbits. Other: No sizable mastoid effusions. IMPRESSION: Increased size of the left sphenoid wing meningioma and increased surrounding vasogenic edema, as detailed above. Electronically Signed   By: Margaretha Sheffield MD   On: 05/14/2020 18:13      IMPRESSION/PLAN: Left sphenoid wing meningioma  I had a lengthy discussion with the patient after reviewing their MRI results with them. I summarized our discussion that occurred with the CNS oncology team at this week's tumor board.  Due to the interval growth of her meningioma, treatment is recommended. The consensus is for her to undergo noninvasive treatment, and I do think that radiosurgery is the best option for her. It would be fractionated (anticipate 5 treatments).    We discussed the available radiation techniques, and focused on the details of logistics and delivery.    We discussed the risks, benefits, and side effects of radiosurgery. Side effects may include but not necessarily be limited to: hair loss, headache, rare injury to  the brain or cranial nerves, rare visual loss, rare cognitive decline, inflammation / necrosis of tumor requiring further treatment, rare neurologic symptoms (ie seizures) due to radiation effects. No guarantees of treatment were given. The patient and her family were encouraged to ask questions that I answered to the best of my ability.   After lengthy discussion, the patient would like to proceed with stereotactic brain radiosurgery to their disease. They will meet with neurosurgery in the near future to discuss this further; a neurosurgeon will participate in their case.  Planning 3 T MRI pending to help with accurate planning. CT simulation will take place soon and consent form will be signed at that time.   This encounter was provided by telemedicine  platform; patient desired telemedicine during pandemic precautions.  MyChart video was not accessible and therefore telephone was used. The patient has given verbal consent for this type of encounter and has been advised to only accept a meeting of this type in a secure network environment. On date of service, in total, I spent 35 minutes on this encounter.   The attendants for this meeting include Eppie Gibson  and [name] During the encounter, Eppie Gibson was located at [facility].  Mitra Duling was located at home.   __________________________________________   Eppie Gibson, MD  This document serves as a record of services personally performed by Eppie Gibson, MD. It was created on his behalf by Clerance Lav, a trained medical scribe. The creation of this record is based on the scribe's personal observations and the provider's statements to them. This document has been checked and approved by the attending provider.

## 2020-05-21 ENCOUNTER — Ambulatory Visit: Payer: Medicare Other | Admitting: Internal Medicine

## 2020-05-22 ENCOUNTER — Other Ambulatory Visit: Payer: Self-pay | Admitting: Family Medicine

## 2020-05-22 ENCOUNTER — Other Ambulatory Visit (HOSPITAL_COMMUNITY): Payer: Self-pay | Admitting: Hematology

## 2020-05-23 ENCOUNTER — Encounter: Payer: Self-pay | Admitting: Radiation Oncology

## 2020-05-24 ENCOUNTER — Ambulatory Visit: Payer: Medicare Other | Admitting: Radiation Oncology

## 2020-05-26 DIAGNOSIS — M1711 Unilateral primary osteoarthritis, right knee: Secondary | ICD-10-CM | POA: Diagnosis not present

## 2020-05-26 DIAGNOSIS — M170 Bilateral primary osteoarthritis of knee: Secondary | ICD-10-CM | POA: Diagnosis not present

## 2020-05-28 ENCOUNTER — Inpatient Hospital Stay (HOSPITAL_COMMUNITY): Payer: Medicare Other

## 2020-05-28 ENCOUNTER — Inpatient Hospital Stay (HOSPITAL_COMMUNITY): Payer: Medicare Other | Attending: Hematology

## 2020-05-28 ENCOUNTER — Other Ambulatory Visit: Payer: Self-pay

## 2020-05-28 ENCOUNTER — Encounter (HOSPITAL_COMMUNITY): Payer: Self-pay

## 2020-05-28 ENCOUNTER — Ambulatory Visit (HOSPITAL_COMMUNITY)
Admission: RE | Admit: 2020-05-28 | Discharge: 2020-05-28 | Disposition: A | Discharge status: Home / Self Care | Payer: Medicare Other | Source: Ambulatory Visit | Attending: Radiation Oncology | Admitting: Radiation Oncology

## 2020-05-28 VITALS — BP 137/59 | HR 70 | Temp 97.2°F | Resp 20 | Wt 282.2 lb

## 2020-05-28 DIAGNOSIS — Z95828 Presence of other vascular implants and grafts: Secondary | ICD-10-CM

## 2020-05-28 DIAGNOSIS — I10 Essential (primary) hypertension: Secondary | ICD-10-CM | POA: Insufficient documentation

## 2020-05-28 DIAGNOSIS — Z79899 Other long term (current) drug therapy: Secondary | ICD-10-CM | POA: Insufficient documentation

## 2020-05-28 DIAGNOSIS — R0602 Shortness of breath: Secondary | ICD-10-CM | POA: Insufficient documentation

## 2020-05-28 DIAGNOSIS — G9389 Other specified disorders of brain: Secondary | ICD-10-CM | POA: Diagnosis not present

## 2020-05-28 DIAGNOSIS — G935 Compression of brain: Secondary | ICD-10-CM | POA: Diagnosis not present

## 2020-05-28 DIAGNOSIS — D329 Benign neoplasm of meninges, unspecified: Secondary | ICD-10-CM | POA: Insufficient documentation

## 2020-05-28 DIAGNOSIS — M7989 Other specified soft tissue disorders: Secondary | ICD-10-CM | POA: Diagnosis not present

## 2020-05-28 DIAGNOSIS — Z5112 Encounter for antineoplastic immunotherapy: Secondary | ICD-10-CM | POA: Insufficient documentation

## 2020-05-28 DIAGNOSIS — M25562 Pain in left knee: Secondary | ICD-10-CM | POA: Insufficient documentation

## 2020-05-28 DIAGNOSIS — C3492 Malignant neoplasm of unspecified part of left bronchus or lung: Secondary | ICD-10-CM

## 2020-05-28 DIAGNOSIS — I313 Pericardial effusion (noninflammatory): Secondary | ICD-10-CM | POA: Diagnosis not present

## 2020-05-28 DIAGNOSIS — D496 Neoplasm of unspecified behavior of brain: Secondary | ICD-10-CM | POA: Diagnosis not present

## 2020-05-28 DIAGNOSIS — M25561 Pain in right knee: Secondary | ICD-10-CM | POA: Insufficient documentation

## 2020-05-28 DIAGNOSIS — C3412 Malignant neoplasm of upper lobe, left bronchus or lung: Secondary | ICD-10-CM | POA: Diagnosis not present

## 2020-05-28 LAB — COMPREHENSIVE METABOLIC PANEL
ALT: 21 U/L (ref 0–44)
AST: 21 U/L (ref 15–41)
Albumin: 3.7 g/dL (ref 3.5–5.0)
Alkaline Phosphatase: 79 U/L (ref 38–126)
Anion gap: 9 (ref 5–15)
BUN: 17 mg/dL (ref 8–23)
CO2: 25 mmol/L (ref 22–32)
Calcium: 9.3 mg/dL (ref 8.9–10.3)
Chloride: 108 mmol/L (ref 98–111)
Creatinine, Ser: 0.73 mg/dL (ref 0.44–1.00)
GFR, Estimated: >60 mL/min (ref >60)
Glucose, Bld: 107 mg/dL — ABNORMAL HIGH (ref 70–99)
Potassium: 4.2 mmol/L (ref 3.5–5.1)
Sodium: 142 mmol/L (ref 135–145)
Total Bilirubin: 0.3 mg/dL (ref 0.3–1.2)
Total Protein: 6.8 g/dL (ref 6.5–8.1)

## 2020-05-28 LAB — CBC WITH DIFFERENTIAL/PLATELET
Abs Immature Granulocytes: 0.02 10*3/uL (ref 0.00–0.07)
Basophils Absolute: 0 10*3/uL (ref 0.0–0.1)
Basophils Relative: 0 %
Eosinophils Absolute: 0 10*3/uL (ref 0.0–0.5)
Eosinophils Relative: 0 %
HCT: 39.8 % (ref 36.0–46.0)
Hemoglobin: 12.1 g/dL (ref 12.0–15.0)
Immature Granulocytes: 0 %
Lymphocytes Relative: 9 %
Lymphs Abs: 0.8 10*3/uL (ref 0.7–4.0)
MCH: 26.9 pg (ref 26.0–34.0)
MCHC: 30.4 g/dL (ref 30.0–36.0)
MCV: 88.6 fL (ref 80.0–100.0)
Monocytes Absolute: 0.6 10*3/uL (ref 0.1–1.0)
Monocytes Relative: 7 %
Neutro Abs: 6.9 10*3/uL (ref 1.7–7.7)
Neutrophils Relative %: 84 %
Platelets: 367 10*3/uL (ref 150–400)
RBC: 4.49 MIL/uL (ref 3.87–5.11)
RDW: 15.6 % — ABNORMAL HIGH (ref 11.5–15.5)
WBC: 8.3 10*3/uL (ref 4.0–10.5)
nRBC: 0 % (ref 0.0–0.2)

## 2020-05-28 IMAGING — MR MR HEAD WO/W CM
10 of 14 series · 20 of 48 positions shown · IV contrast (gadavist)
Comparison: MR head without and with contrast [DATE] and
[DATE]
COMPARISON: MR head without and with contrast [DATE] and
[DATE]

Addendum:
CLINICAL DATA: Meningioma. Status post SRS.

EXAM:
MRI HEAD WITHOUT AND WITH CONTRAST
TECHNIQUE: Multiplanar, multiecho pulse sequences of the brain and surrounding
structures were obtained without and with intravenous contrast.
CONTRAST:  10mL GADAVIST GADOBUTROL 1 MMOL/ML IV SOLN

[Series 2: FLAIR · sagittal · 3.0mm · 0.47mm/px · 1 of 50 slices shown (1 of 2)]
[im 1/50]
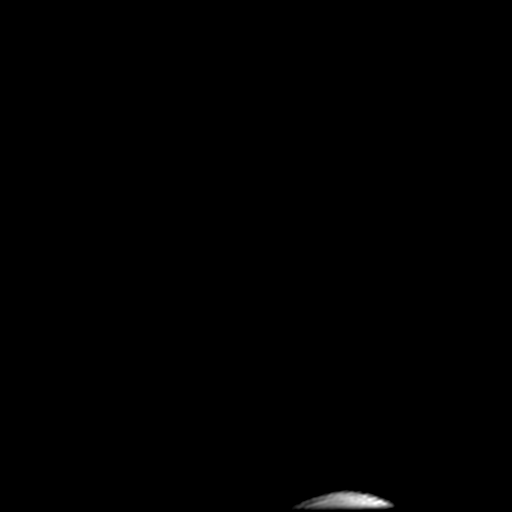

[Series 3: DWI · axial · 3.0mm · 0.94mm/px · z∈[-92,+79]mm · 3 of 115 slices shown]
[im 1/115]
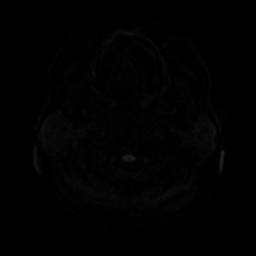
[im 58/115]
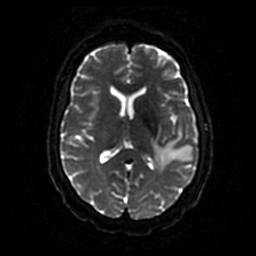
[im 115/115]
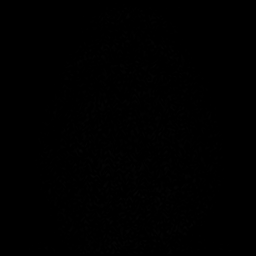

[Series 4: FLAIR · axial · 3.0mm · 0.47mm/px · 1 of 60 slices shown (2 of 2)]
[im 1/60]
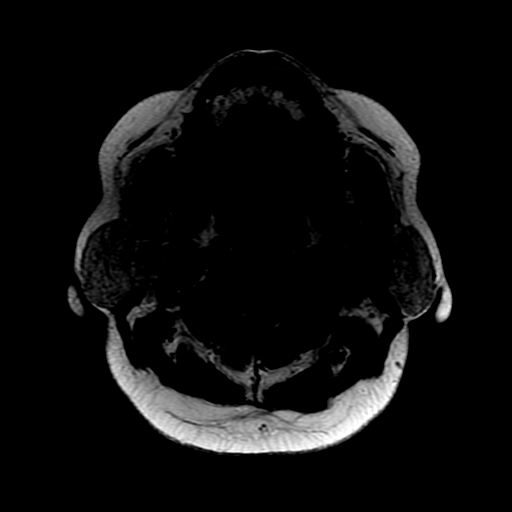

[Series 5: SWI · axial · 3.0mm · 0.47mm/px · z∈[-100,+72]mm · 3 of 116 slices shown]
[im 1/116]
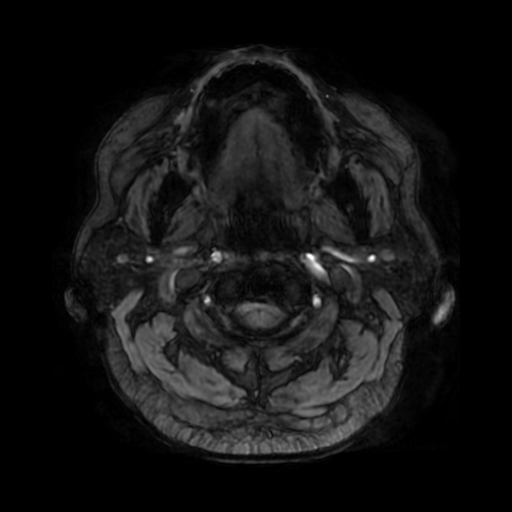
[im 58/116]
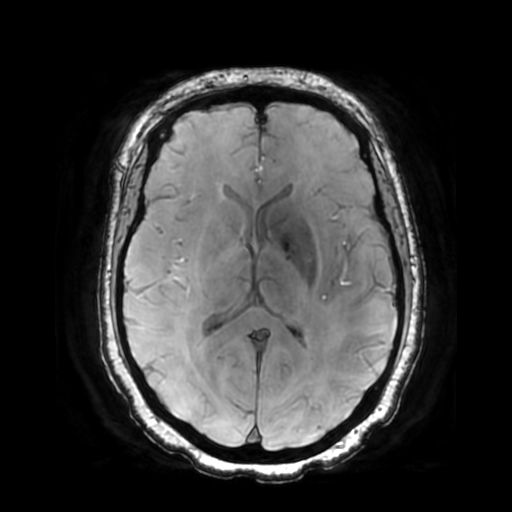
[im 116/116]
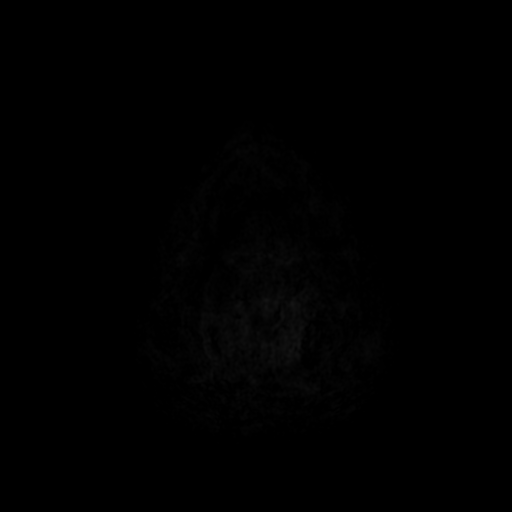

[Series 8: T2 post-contrast · axial · 5.0mm · 0.47mm/px · 1 of 30 slices shown (1 of 2)]
[im 1/30]
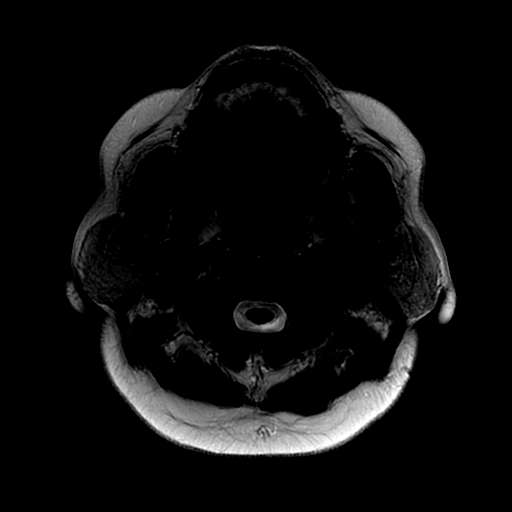

[Series 9: T1 post-contrast · coronal · 3.0mm · 0.39mm/px · 1 of 55 slices shown (1 of 2)]
[im 1/55]
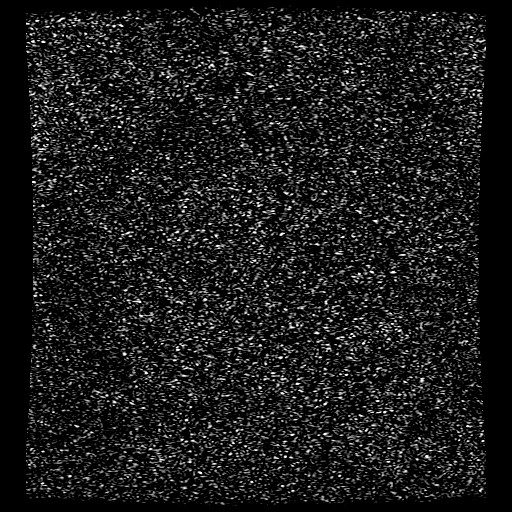

[Series 10: FLAIR post-contrast · sagittal · 3.0mm · 0.47mm/px · 1 of 50 slices shown]
[im 1/50]
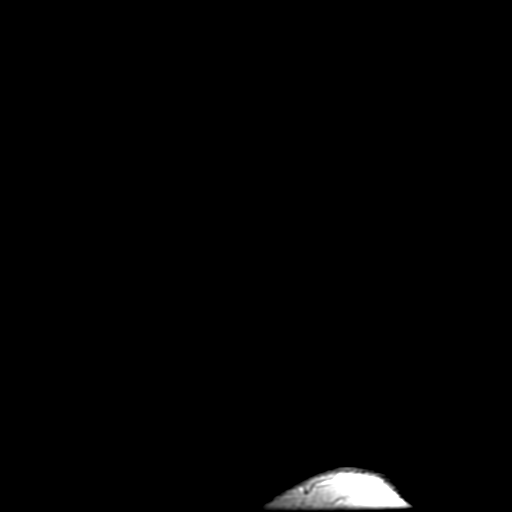

[Series 12: T2 post-contrast · coronal · 3.0mm · 0.39mm/px · 1 of 55 slices shown (2 of 2)]
[im 1/55]
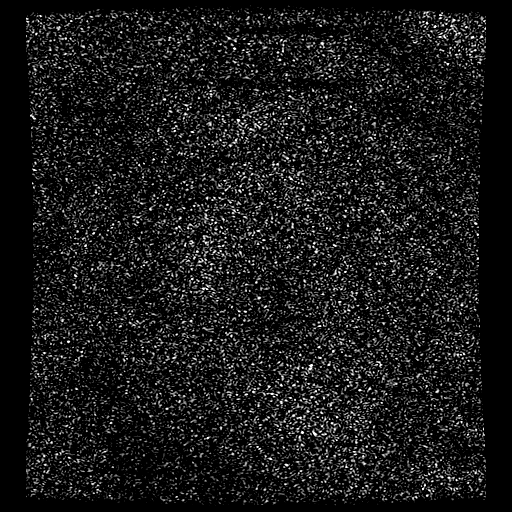

[Series 350: ADC · axial · 3.0mm · 0.94mm/px · 1 of 58 slices shown]
[im 1/58]
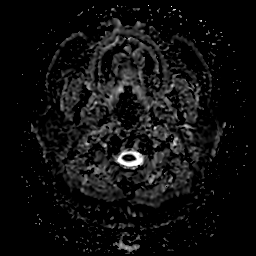

[Series 1100: T1 post-contrast · axial · 0.9mm · 0.50mm/px · z∈[-150,+105]mm · 7 of 301 slices shown (2 of 2)]
[im 1/301]
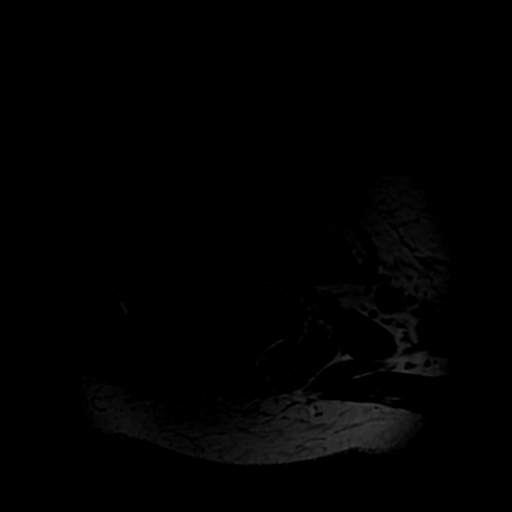
[im 51/301]
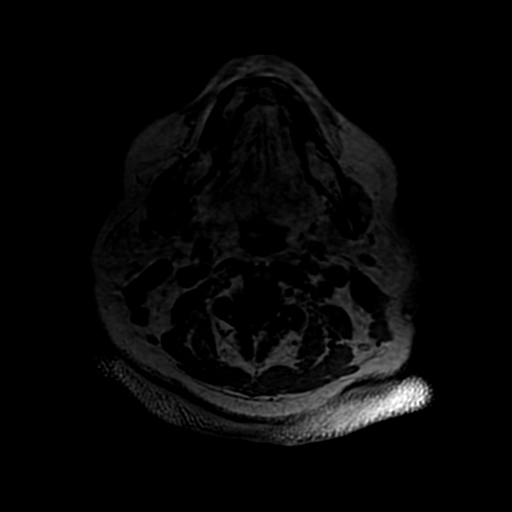
[im 101/301]
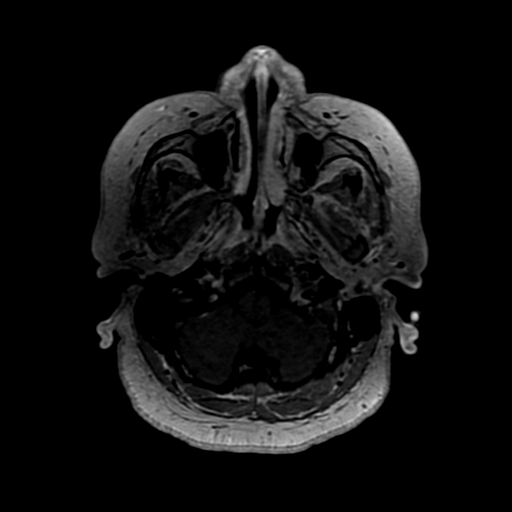
[im 151/301]
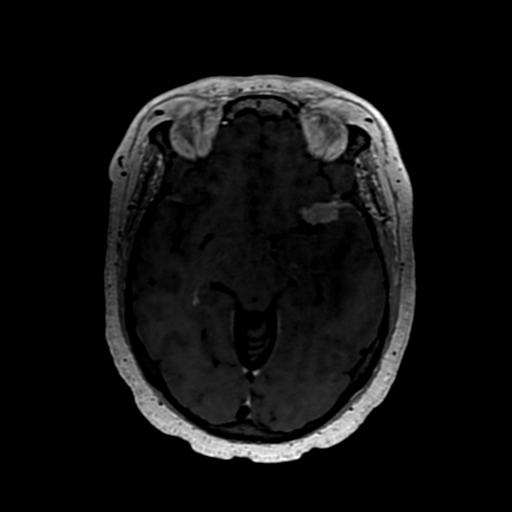
[im 201/301]
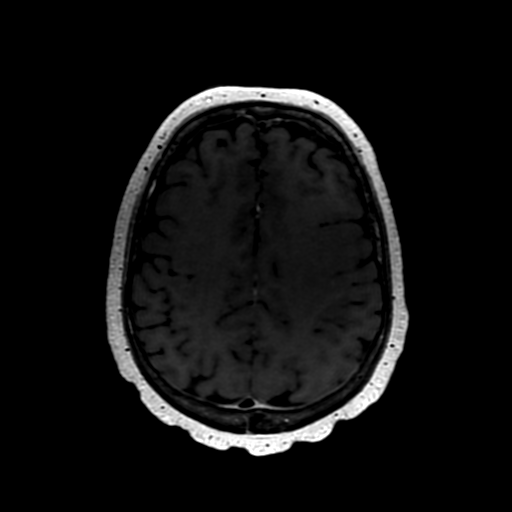
[im 251/301]
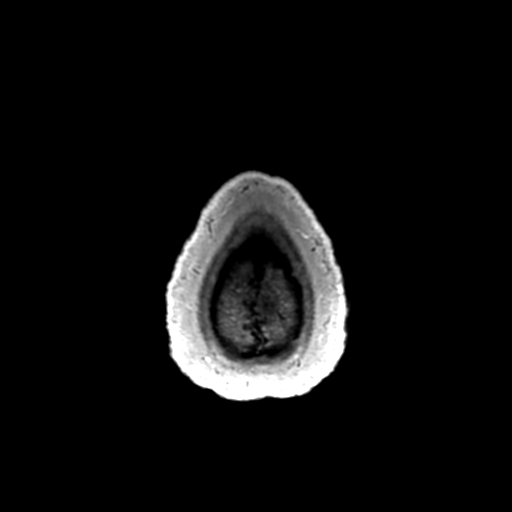
[im 301/301]
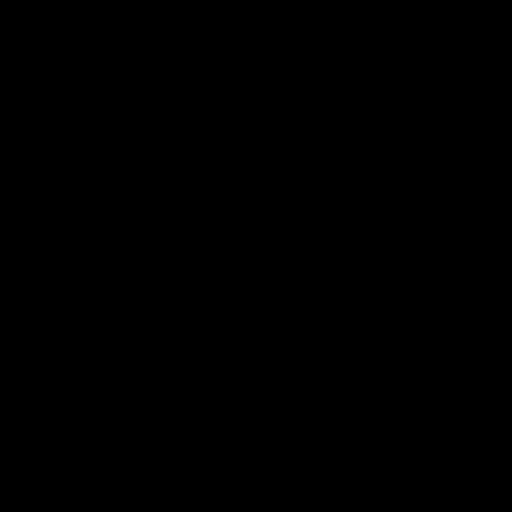

[20 of 48 positions shown; findings below may reference images not displayed]

FINDINGS: Brain: Left sphenoid wing meningioma is stable in size measuring
x 2.2 x 2.8 cm. Tumor extends to the left cavernous sinus without
extension into the cavernous sinus.

Local mass effect is present with some uncal herniation. T2 signal
is present in the left temporal white matter extending into the left
parietal lobe.

No additional lesions are present.

No acute infarct or hemorrhage is present.

There is some mass effect on the left lateral ventricle. 2 mm
midline shift noted.

The internal auditory canals are within normal limits. The brainstem
and cerebellum are within normal limits.

Vascular: The left MCA is immediately adjacent to the tumor. Flow is
present in the major intracranial arteries.

Skull and upper cervical spine: The craniocervical junction is
normal. Upper cervical spine is within normal limits. Marrow signal
is unremarkable.

Sinuses/Orbits: The globes and orbits are within normal limits. The
paranasal sinuses and mastoid air cells are clear.

Other:
IMPRESSION: 1. Stable left sphenoid wing meningioma with local mass effect and 2
mm midline shift.
2. Stable white matter T2 signal abnormality in the left temporal
white matter extending into the left parietal lobe secondary to mass
effect.

ADDENDUM:
A focal cortical lesion the left middle frontal lobe gyrus on image
212 of series [HK] is stable at 4 mm. A second more anterior lesion
in the left frontal lobe med image 180 is also stable 3 mm.

Impressions: 2 small 3 mm cortical based lesions in the anterior
left frontal lobe are compatible with metastases known lung cancer.
These findings were discussed by Dr. BANAAG is with Dr. BANAAG
[DATE]

*** End of Addendum ***
FINDINGS: Brain: Left sphenoid wing meningioma is stable in size measuring
x 2.2 x 2.8 cm. Tumor extends to the left cavernous sinus without
extension into the cavernous sinus.

Local mass effect is present with some uncal herniation. T2 signal
is present in the left temporal white matter extending into the left
parietal lobe.

No additional lesions are present.

No acute infarct or hemorrhage is present.

There is some mass effect on the left lateral ventricle. 2 mm
midline shift noted.

The internal auditory canals are within normal limits. The brainstem
and cerebellum are within normal limits.

Vascular: The left MCA is immediately adjacent to the tumor. Flow is
present in the major intracranial arteries.

Skull and upper cervical spine: The craniocervical junction is
normal. Upper cervical spine is within normal limits. Marrow signal
is unremarkable.

Sinuses/Orbits: The globes and orbits are within normal limits. The
paranasal sinuses and mastoid air cells are clear.

Other:
IMPRESSION: 1. Stable left sphenoid wing meningioma with local mass effect and 2
mm midline shift.
2. Stable white matter T2 signal abnormality in the left temporal
white matter extending into the left parietal lobe secondary to mass
effect.

## 2020-05-28 MED ORDER — HEPARIN SOD (PORK) LOCK FLUSH 100 UNIT/ML IV SOLN
500.0000 [IU] | INTRAVENOUS | Status: AC | PRN
  Administered 2020-05-28: 500 [IU]
  Filled 2020-05-28: qty 5

## 2020-05-28 MED ORDER — GADOBUTROL 1 MMOL/ML IV SOLN
10.0000 mL | Freq: Once | INTRAVENOUS | Status: AC | PRN
  Administered 2020-05-28: 10 mL via INTRAVENOUS

## 2020-05-28 MED ORDER — SODIUM CHLORIDE 0.9 % IV SOLN: Freq: Once | INTRAVENOUS | Status: AC

## 2020-05-28 MED ORDER — SODIUM CHLORIDE 0.9% FLUSH
10.0000 mL | INTRAVENOUS | Status: DC | PRN
  Administered 2020-05-28: 10 mL

## 2020-05-28 MED ORDER — HEPARIN SOD (PORK) LOCK FLUSH 100 UNIT/ML IV SOLN
500.0000 [IU] | Freq: Once | INTRAVENOUS | Status: AC | PRN
  Administered 2020-05-28: 500 [IU]

## 2020-05-28 MED ORDER — SODIUM CHLORIDE 0.9 % IV SOLN
10.0000 mg/kg | Freq: Once | INTRAVENOUS | Status: AC
  Administered 2020-05-28: 1240 mg via INTRAVENOUS
  Filled 2020-05-28: qty 4.8

## 2020-05-28 NOTE — Patient Instructions (Signed)
[facility] at [facility]  Discharge Instructions:   _______________________________________________________________  Thank you for choosing [facility] at [facility] to provide your oncology and hematology care.  To afford each patient quality time with our providers, please arrive at least 15 minutes before your scheduled appointment.  You need to re-schedule your appointment if you arrive 10 or more minutes late.  We strive to give you quality time with our providers, and arriving late affects you and other patients whose appointments are after yours.  Also, if you no show three or more times for appointments you may be dismissed from the clinic.  Again, thank you for choosing [facility] at [facility]. Our hope is that these requests will allow you access to exceptional care and in a timely manner. _______________________________________________________________  If you have questions after your visit, please contact our office at [phone] between the hours of 8:30 a.m. and 5:00 p.m. Voicemails left after 4:30 p.m. will not be returned until the following business day. _______________________________________________________________  For prescription refill requests, have your pharmacy contact our office. _______________________________________________________________  Recommendations made by the consultant and any test results will be sent to your referring physician. _______________________________________________________________ 

## 2020-05-28 NOTE — Progress Notes (Signed)
Patient tolerated therapy with no complaints voiced.  Side effects with management reviewed with understanding verbalized.  Port site clean and dry with no bruising or swelling noted at site.   No blood return noted before and after administration of therapy.  No complaints of pain with flush.  Flushed easily before and after treatment. Band aid applied.  Patient left in satisfactory condition with VSS and no s/s of distress noted.

## 2020-05-31 ENCOUNTER — Other Ambulatory Visit: Payer: Self-pay

## 2020-05-31 ENCOUNTER — Ambulatory Visit
Admission: RE | Admit: 2020-05-31 | Discharge: 2020-05-31 | Disposition: A | Discharge status: Home / Self Care | Payer: Medicare Other | Source: Ambulatory Visit | Attending: Radiation Oncology | Admitting: Radiation Oncology

## 2020-05-31 VITALS — BP 151/82 | HR 80 | Temp 96.9°F | Resp 20

## 2020-05-31 DIAGNOSIS — Z452 Encounter for adjustment and management of vascular access device: Secondary | ICD-10-CM | POA: Diagnosis not present

## 2020-05-31 DIAGNOSIS — Z95828 Presence of other vascular implants and grafts: Secondary | ICD-10-CM

## 2020-05-31 DIAGNOSIS — Z510 Encounter for antineoplastic radiation therapy: Secondary | ICD-10-CM | POA: Insufficient documentation

## 2020-05-31 DIAGNOSIS — D320 Benign neoplasm of cerebral meninges: Secondary | ICD-10-CM | POA: Insufficient documentation

## 2020-05-31 DIAGNOSIS — C7931 Secondary malignant neoplasm of brain: Secondary | ICD-10-CM | POA: Diagnosis not present

## 2020-05-31 MED ORDER — SODIUM CHLORIDE 0.9% FLUSH
10.0000 mL | Freq: Once | INTRAVENOUS | Status: AC
  Administered 2020-05-31: 10 mL via INTRAVENOUS

## 2020-05-31 MED ORDER — HEPARIN SOD (PORK) LOCK FLUSH 100 UNIT/ML IV SOLN
500.0000 [IU] | Freq: Once | INTRAVENOUS | Status: AC
  Administered 2020-05-31: 500 [IU] via INTRAVENOUS

## 2020-05-31 NOTE — Progress Notes (Signed)
Has armband been applied?  Yes.    Does patient have an allergy to IV contrast dye?: No.   Has patient ever received premedication for IV contrast dye?: No.   Does patient take metformin?: No.  Date of lab work: May 28, 2020 BUN: 17 CR: 0.73  IV site: subclavian right, condition patent and no redness  Has IV site been added to flowsheet?  Yes.    Vitals:   05/31/20 1000  BP: (!) 151/82  Pulse: 80  Resp: 20  Temp: (!) 96.9 F (36.1 C)  SpO2: 97%

## 2020-06-04 ENCOUNTER — Ambulatory Visit
Admission: RE | Admit: 2020-06-04 | Discharge: 2020-06-04 | Disposition: A | Discharge status: Home / Self Care | Payer: Medicare Other | Source: Ambulatory Visit | Attending: Radiation Oncology | Admitting: Radiation Oncology

## 2020-06-04 VITALS — BP 113/68 | HR 72 | Temp 97.0°F | Resp 19

## 2020-06-04 DIAGNOSIS — Z452 Encounter for adjustment and management of vascular access device: Secondary | ICD-10-CM | POA: Diagnosis not present

## 2020-06-04 DIAGNOSIS — Z510 Encounter for antineoplastic radiation therapy: Secondary | ICD-10-CM | POA: Diagnosis not present

## 2020-06-04 DIAGNOSIS — D320 Benign neoplasm of cerebral meninges: Secondary | ICD-10-CM

## 2020-06-04 DIAGNOSIS — C7931 Secondary malignant neoplasm of brain: Secondary | ICD-10-CM | POA: Diagnosis not present

## 2020-06-04 NOTE — Progress Notes (Signed)
  Radiation Oncology         [phone]) [phone] ________________________________  Name: Jala Dundon MRN: [id number]  Date: 06/04/2020  DOB: 1950-09-21  Stereotactic Treatment Procedure Note  SPECIAL TREATMENT PROCEDURE  Outpatient    ICD-10-CM   1. Meningioma, cerebral ([place])  D32.0   2. Brain metastases ([place])  C79.31     3D TREATMENT PLANNING AND DOSIMETRY:  The patient's radiation plan was reviewed and approved by neurosurgery and radiation oncology prior to treatment.  It showed 3-dimensional radiation distributions overlaid onto the planning CT/MRI image set.  The [facility] for the target structures as well as the organs at risk were reviewed. The documentation of the 3D plan and dosimetry are filed in the radiation oncology EMR.  NARRATIVE:  Tyeshia Cornforth was brought to the TrueBeam stereotactic radiation treatment machine and placed supine on the CT couch. The head frame was applied, and the patient was set up for stereotactic radiosurgery.  Neurosurgery was present for the set-up and delivery  SIMULATION VERIFICATION:  In the couch zero-angle position, the patient underwent Exactrac imaging using the Brainlab system with orthogonal KV images.  These were carefully aligned and repeated to confirm treatment position for each of the isocenters.  The Exactrac snap film verification was repeated at each couch angle.  SPECIAL TREATMENT PROCEDURE: [name] received stereotactic radiosurgery to the following targets: Left sphenoid wing meningioma received 5 Gray  Left frontal 5 mm metastasis and left frontal 6 mm metastasis received 20 Gray  All lesions were treated with radiosurgery, 6 MV photons, flattening filter free, radiosurgery technique with IMRT.  Next week she will return to receive her second fraction to the meningioma -anticipate 25 Gray in 5 fractions.  The metastases have been treated in a single fraction and will no longer get treated   ExacTrac Snap verification was performed for  each couch angle.  This constitutes a special treatment procedure due to the ablative dose delivered and the technical nature of treatment.  This highly technical modality of treatment ensures that the ablative dose is centered on the patient's tumor while sparing normal tissues from excessive dose and risk of detrimental effects.  STEREOTACTIC TREATMENT MANAGEMENT:  Following delivery, the patient was transported to nursing in stable condition and monitored for possible acute effects.  Vital signs were recorded BP 113/68 (BP Location: Right Arm, Patient Position: Sitting)   Pulse 72   Temp (!) 97 F (36.1 C) (Temporal)   Resp 19   SpO2 100% . The patient tolerated treatment without significant acute effects, and was discharged to home in stable condition.    PLAN: Follow-up in approximately 3 days for next treatment.  ________________________________   Eppie Gibson, MD

## 2020-06-04 NOTE — Progress Notes (Signed)
Nurse monitoring complete status post 1 of 5 SRS treatments. Patient without complaints. Patient denies new or worsening neurologic symptoms. Vitals stable. Instructed patient to avoid strenuous activity for the next 24 hours. Instructed patient to call [phone] with needs related to treatment after hours or over the weekend. Patient verbalized understanding. Ambulated out of clinic with personal cane to family member's vehicle without incident   Vitals:   06/04/20 1450  BP: 113/68  Pulse: 72  Resp: 19  Temp: (!) 97 F (36.1 C)  SpO2: 100%

## 2020-06-04 NOTE — Progress Notes (Signed)
I met with the patient in person today before her radiosurgery.  I informed her that upon reviewing her most recent brain MRI, I noted 2 subcentimeter lesions in the brain that were suspicious for metastases.  I spoke about these lesions with radiology and they concurred that they are likely metastases related to her lung cancer.  I showed her these lesions on her MRI, both are located in the left frontal lobe.  I let her know that I have modified her plan so that the radiosurgery not only focuses upon the meningioma, but that it will also treat her 2 additional lesions consistent with metastases.  The plan is to treat all 3 lesions today.  For the next 4 treatment she will receive more fractionated dose to the meningioma.  The 2 tiny metastases will be treated to a high dose in just 1 treatment today.  I do not think she will have any significant additional side effects if we add these 2 lesions to the plan/treatment because they are so small.  She is enthusiastic to proceed.  She understands that we will watch her closely with MRIs roughly every 3 months to monitor her brain in case other lesions arise.  -----------------------------------  Eppie Gibson, MD

## 2020-06-07 ENCOUNTER — Ambulatory Visit
Admission: RE | Admit: 2020-06-07 | Discharge: 2020-06-07 | Disposition: A | Discharge status: Home / Self Care | Payer: Medicare Other | Source: Ambulatory Visit | Attending: Radiation Oncology | Admitting: Radiation Oncology

## 2020-06-07 ENCOUNTER — Ambulatory Visit (HOSPITAL_COMMUNITY)
Admission: RE | Admit: 2020-06-07 | Discharge: 2020-06-07 | Disposition: A | Discharge status: Home / Self Care | Payer: Medicare Other | Source: Ambulatory Visit | Attending: Hematology | Admitting: Hematology

## 2020-06-07 ENCOUNTER — Ambulatory Visit (HOSPITAL_COMMUNITY): Payer: Medicare Other

## 2020-06-07 ENCOUNTER — Other Ambulatory Visit (HOSPITAL_COMMUNITY): Payer: Self-pay | Admitting: *Deleted

## 2020-06-07 ENCOUNTER — Other Ambulatory Visit: Payer: Self-pay

## 2020-06-07 VITALS — BP 128/67 | HR 70 | Temp 96.9°F | Resp 18

## 2020-06-07 DIAGNOSIS — C3492 Malignant neoplasm of unspecified part of left bronchus or lung: Secondary | ICD-10-CM | POA: Diagnosis not present

## 2020-06-07 DIAGNOSIS — D320 Benign neoplasm of cerebral meninges: Secondary | ICD-10-CM

## 2020-06-07 DIAGNOSIS — K838 Other specified diseases of biliary tract: Secondary | ICD-10-CM | POA: Diagnosis not present

## 2020-06-07 DIAGNOSIS — I313 Pericardial effusion (noninflammatory): Secondary | ICD-10-CM | POA: Diagnosis not present

## 2020-06-07 DIAGNOSIS — I2510 Atherosclerotic heart disease of native coronary artery without angina pectoris: Secondary | ICD-10-CM | POA: Diagnosis not present

## 2020-06-07 DIAGNOSIS — C3490 Malignant neoplasm of unspecified part of unspecified bronchus or lung: Secondary | ICD-10-CM | POA: Diagnosis not present

## 2020-06-07 IMAGING — CT CT CHEST W/ CM
2 of 4 series · 14 of 36 positions shown, 17 images · IV contrast (omnipaque)
Comparison: [DATE]

CLINICAL DATA: Non-small-cell lung cancer.  Restaging.

EXAM:
CT CHEST WITH CONTRAST
TECHNIQUE: Multidetector CT imaging of the chest was performed during
intravenous contrast administration.
CONTRAST:  75mL OMNIPAQUE IOHEXOL 300 MG/ML  SOLN

[Series 2: axial st · axial · 0.83mm/px · z∈[+669,+909]mm · 11 of 142 slices shown, 14 images]
[im 11/142  mediastinal]
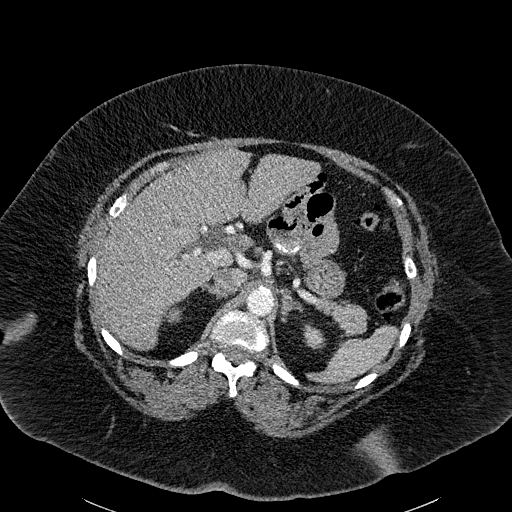
[im 11/142  lung]
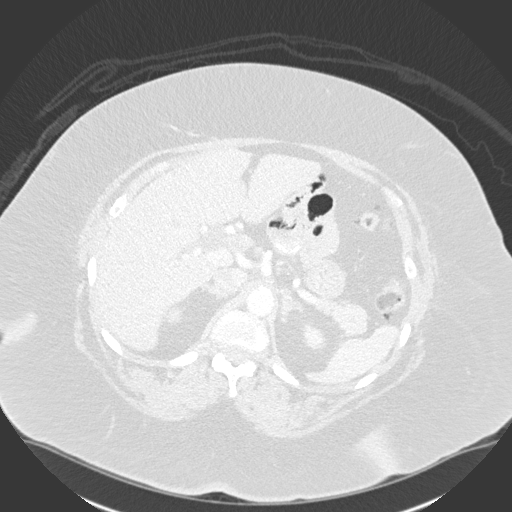
[im 22/142  lung]
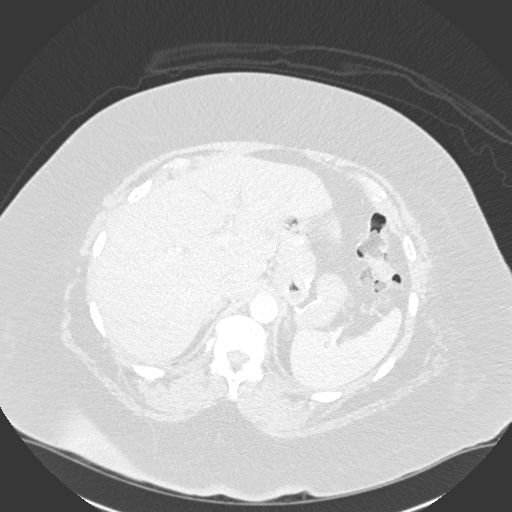
[im 33/142  lung]
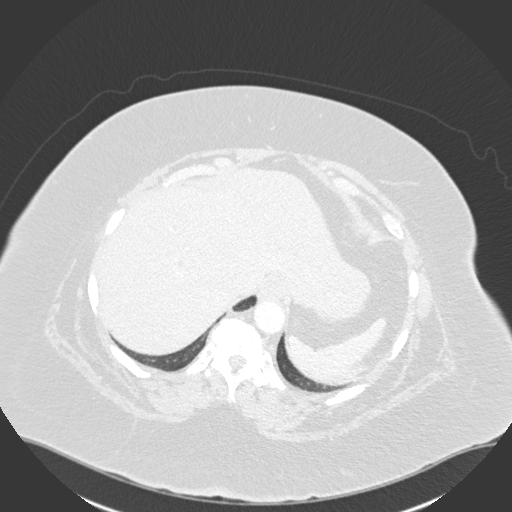
[im 44/142  lung]
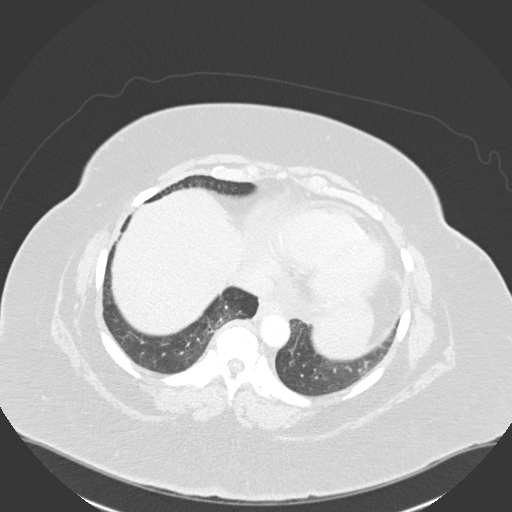
[im 55/142  mediastinal]
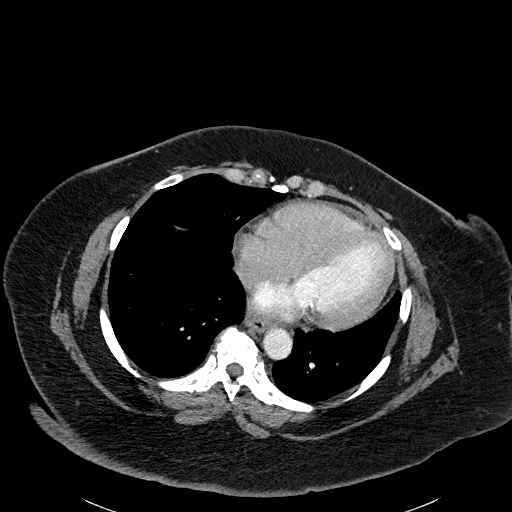
[im 55/142  lung]
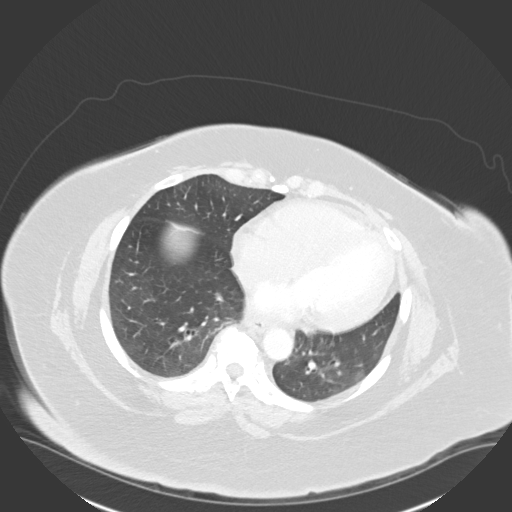
[im 76/142  lung]
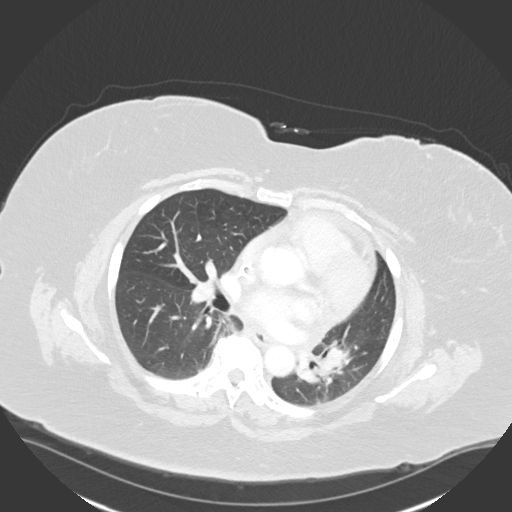
[im 87/142  lung]
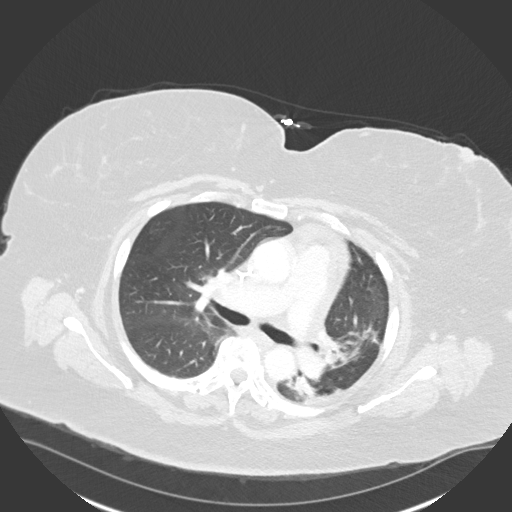
[im 98/142  lung]
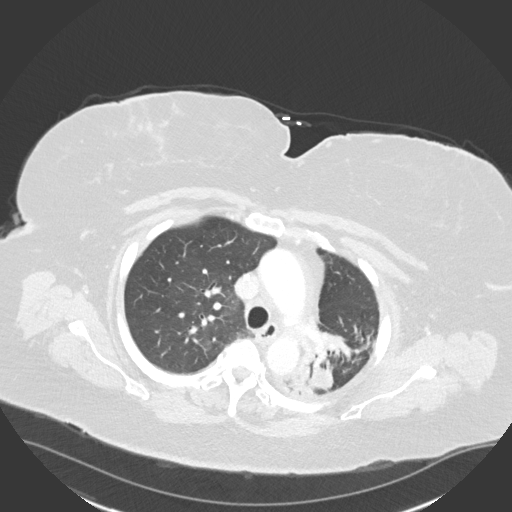
[im 109/142  mediastinal]
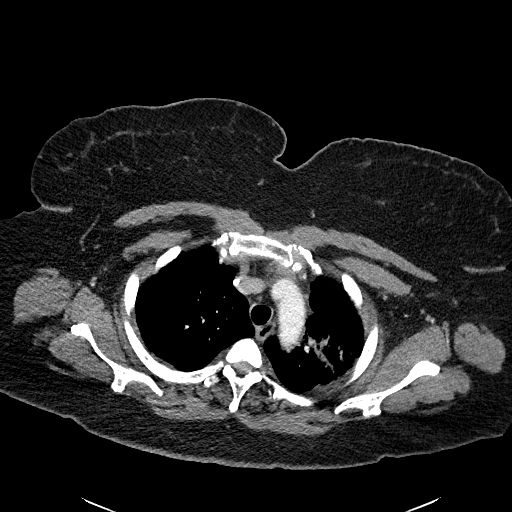
[im 109/142  lung]
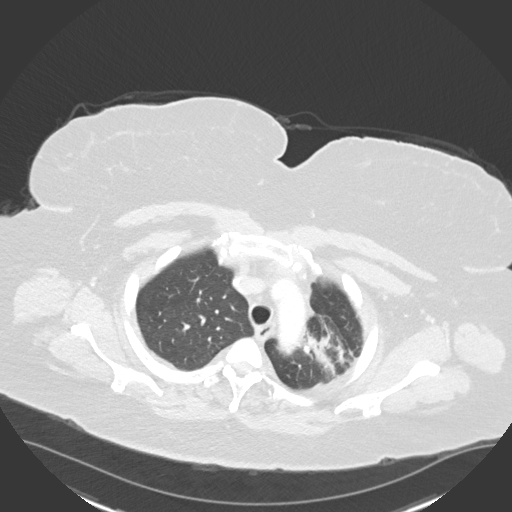
[im 120/142  lung]
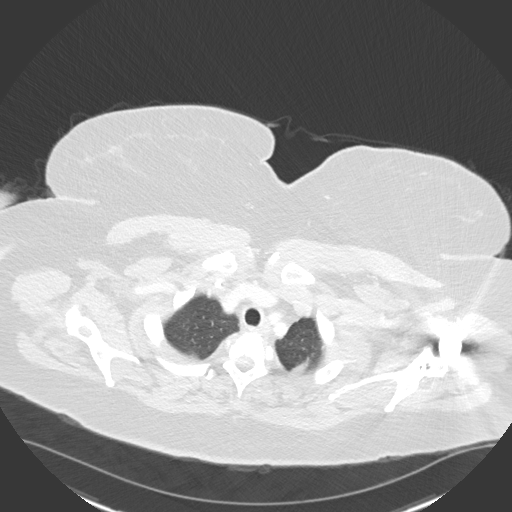
[im 131/142  lung]
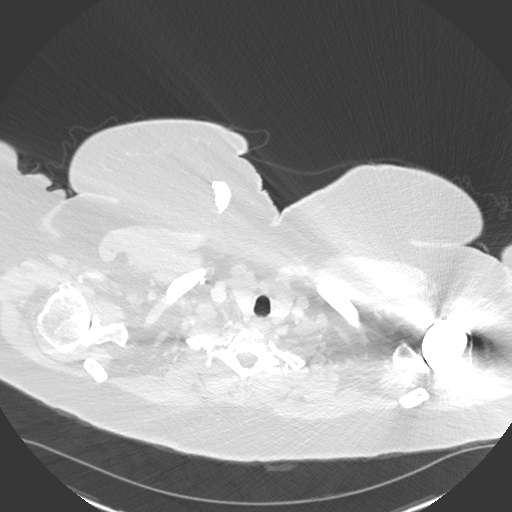

[Series 5: coronal · coronal · 0.55mm/px · 3 of 152 slices shown]
[im 31/152  lung]
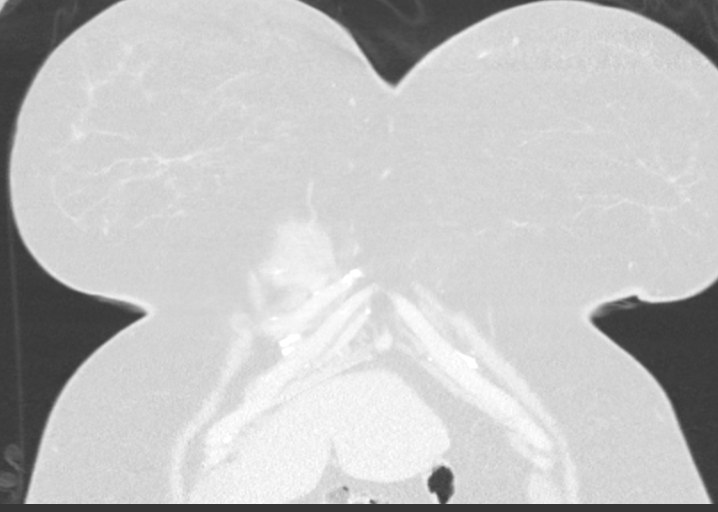
[im 61/152  lung]
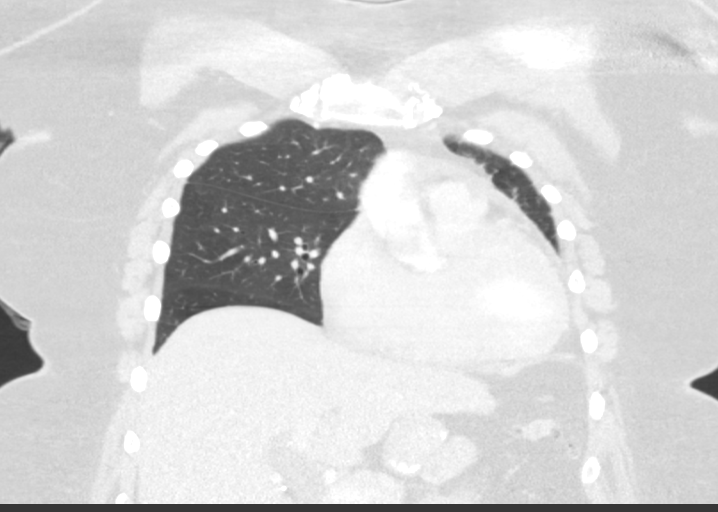
[im 91/152  lung]
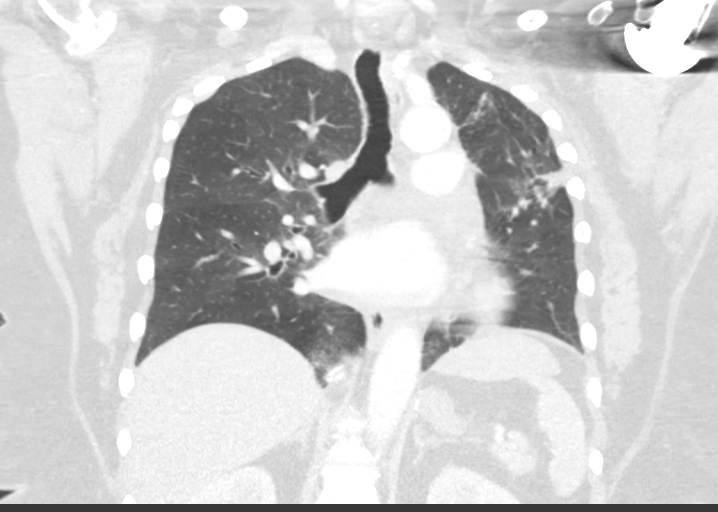

[14 of 36 positions shown; findings below may reference images not displayed]

FINDINGS: Cardiovascular: The heart size is normal. Trace pericardial
effusion. Atherosclerotic calcification is noted in the wall of the
thoracic aorta. Right Port-A-Cath tip is positioned at the SVC/RA
junction.

Mediastinum/Nodes: No mediastinal lymphadenopathy. There is no hilar
lymphadenopathy. Irregular soft tissue in the left hilum is
compatible with post treatment scarring. The esophagus has normal
imaging features. There is no axillary lymphadenopathy.

Lungs/Pleura: Left parahilar radiation fibrosis is similar to prior.
Nodular lesion measured previously at 2.0 x 1.6 cm is similar today
at 2.0 x 1.3 cm. Subtle mosaic attenuation in the right lower lobe
remains stable. No new or progressive lung findings on today's
study. No pleural effusion.

Upper Abdomen: Similar appearance of intra and extrahepatic biliary
duct dilatation. Small hypoattenuating liver lesions appear similar
in the interval, likely benign. Main pancreatic duct is mildly
distended at 3-4 mm in the head of pancreas. Gastric bypass anatomy
noted in the stomach.

Musculoskeletal: No worrisome lytic or sclerotic osseous
abnormality. Degenerative changes noted right shoulder. Left
shoulder replacement.
IMPRESSION: 1. Left upper lobe pulmonary nodule measures minimally smaller today
with similar appearance of surrounding post radiation scarring.
2. No new or progressive findings in the chest.
3. Intra and extrahepatic biliary duct dilatation with mild
dilatation of the main pancreatic duct. Imaging features not
substantially different than abdomen CT of [DATE].
4.  Aortic Atherosclerois ([74]-170.0)

## 2020-06-07 MED ORDER — HEPARIN SOD (PORK) LOCK FLUSH 100 UNIT/ML IV SOLN
INTRAVENOUS | Status: AC
  Filled 2020-06-07: qty 5

## 2020-06-07 MED ORDER — IOHEXOL 300 MG/ML  SOLN
75.0000 mL | Freq: Once | INTRAMUSCULAR | Status: AC | PRN
  Administered 2020-06-07: 75 mL via INTRAVENOUS

## 2020-06-07 MED ORDER — HEPARIN SOD (PORK) LOCK FLUSH 100 UNIT/ML IV SOLN
500.0000 [IU] | Freq: Once | INTRAVENOUS | Status: AC
  Administered 2020-06-07: 500 [IU] via INTRAVENOUS

## 2020-06-07 NOTE — Progress Notes (Signed)
Nurse monitoring complete status post 2 of 5 SRS treatments. Patient without complaints. Patient denies new or worsening neurologic symptoms. Vitals stable. Instructed patient to avoid strenuous activity for the next 24 hours. Instructed patient to call [phone] with needs related to treatment after hours or over the weekend. Patient verbalized understanding. Patient escorted via wheelchair over to main hospital for CT scan. Confirmed with CT staff that someone would be able to deaccess patient's PAC so she wouldn't have to return to [facility] after scan. No other needs identified at this time   Vitals:   06/07/20 1510  BP: 128/67  Pulse: 70  Resp: 18  Temp: (!) 96.9 F (36.1 C)  SpO2: 100%

## 2020-06-09 ENCOUNTER — Other Ambulatory Visit: Payer: Self-pay

## 2020-06-09 ENCOUNTER — Ambulatory Visit
Admission: RE | Admit: 2020-06-09 | Discharge: 2020-06-09 | Disposition: A | Discharge status: Home / Self Care | Payer: Medicare Other | Source: Ambulatory Visit | Attending: Radiation Oncology | Admitting: Radiation Oncology

## 2020-06-09 VITALS — BP 147/89 | HR 72 | Temp 98.0°F | Resp 19

## 2020-06-09 DIAGNOSIS — D320 Benign neoplasm of cerebral meninges: Secondary | ICD-10-CM | POA: Diagnosis not present

## 2020-06-09 DIAGNOSIS — Z452 Encounter for adjustment and management of vascular access device: Secondary | ICD-10-CM | POA: Diagnosis not present

## 2020-06-09 DIAGNOSIS — C7931 Secondary malignant neoplasm of brain: Secondary | ICD-10-CM | POA: Diagnosis not present

## 2020-06-09 DIAGNOSIS — Z510 Encounter for antineoplastic radiation therapy: Secondary | ICD-10-CM | POA: Diagnosis not present

## 2020-06-09 NOTE — Progress Notes (Signed)
  Radiation Oncology         [phone]) [phone] ________________________________  Name: Denise [name] MRN: [id number]  Date: 06/07/2020  DOB: 09-Nov-1950  Stereotactic Treatment Procedure Note  SPECIAL TREATMENT PROCEDURE  Outpatient    ICD-10-CM   1. Meningioma, cerebral ([place])  D32.0     3D TREATMENT PLANNING AND DOSIMETRY:  The patient's radiation plan was reviewed and approved by neurosurgery and radiation oncology prior to treatment.  It showed 3-dimensional radiation distributions overlaid onto the planning CT/MRI image set.  The [facility] for the target structures as well as the organs at risk were reviewed. The documentation of the 3D plan and dosimetry are filed in the radiation oncology EMR.  NARRATIVE:  Denise [name] was brought to the TrueBeam stereotactic radiation treatment machine and placed supine on the CT couch. The head frame was applied, and the patient was set up for stereotactic radiosurgery.  Neurosurgery was present for the set-up and delivery  SIMULATION VERIFICATION:  In the couch zero-angle position, the patient underwent Exactrac imaging using the Brainlab system with orthogonal KV images.  These were carefully aligned and repeated to confirm treatment position for each of the isocenters.  The Exactrac snap film verification was repeated at each couch angle.  SPECIAL TREATMENT PROCEDURE: Denise [name] received stereotactic radiosurgery to the following targets: Left sphenoid wing meningioma received 5 Gray   Treated with radiosurgery, 6 MV photons, flattening filter free, IMRT.  ExacTrac Snap verification was performed for each couch angle.  This constitutes a special treatment procedure due to the ablative dose delivered and the technical nature of treatment.  This highly technical modality of treatment ensures that the ablative dose is centered on the patient's tumor while sparing normal tissues from excessive dose and risk of detrimental effects.  STEREOTACTIC TREATMENT  MANAGEMENT:  Following delivery, the patient was transported to nursing in stable condition and monitored for possible acute effects.  Vital signs were recorded BP 128/67 (BP Location: Right Arm, Patient Position: Sitting)   Pulse 70   Temp (!) 96.9 F (36.1 C) (Temporal)   Resp 18   SpO2 100% . The patient tolerated treatment without significant acute effects, and was discharged to home in stable condition.    PLAN: Follow-up in 2 days for next treatment.  ________________________________   Eppie Gibson, MD

## 2020-06-09 NOTE — Progress Notes (Signed)
Nurse monitoring complete status post 3 of 5 SRS treatments. Patient without complaints. Patient denies new or worsening neurologic symptoms. Vital signs stable. Instructed patient to avoid strenuous activity for the next 24 hours. Instructed patient to call [phone] with needs related to treatment after hours or over the weekend. Patient verbalized understanding. Patient ambulated out of clinic with cane unassisted to family member's vehicle without incident  Vitals:   06/09/20 1525  BP: (!) 147/89  Pulse: 72  Resp: 19  Temp: 98 F (36.7 C)  SpO2: 100%

## 2020-06-09 NOTE — Progress Notes (Signed)
  Radiation Oncology         [phone]) [phone] ________________________________  Name: Denise [name] MRN: [id number]  Date: 06/09/2020  DOB: January 08, 1951  Stereotactic Treatment Procedure Note  SPECIAL TREATMENT PROCEDURE  Outpatient    ICD-10-CM   1. Meningioma, cerebral ([place])  D32.0     3D TREATMENT PLANNING AND DOSIMETRY:  The patient's radiation plan was reviewed and approved by neurosurgery and radiation oncology prior to treatment.  It showed 3-dimensional radiation distributions overlaid onto the planning CT/MRI image set.  The [facility] for the target structures as well as the organs at risk were reviewed. The documentation of the 3D plan and dosimetry are filed in the radiation oncology EMR.  NARRATIVE:  Denise [name] was brought to the TrueBeam stereotactic radiation treatment machine and placed supine on the CT couch. The head frame was applied, and the patient was set up for stereotactic radiosurgery.  Neurosurgery was present for the set-up and delivery  SIMULATION VERIFICATION:  In the couch zero-angle position, the patient underwent Exactrac imaging using the Brainlab system with orthogonal KV images.  These were carefully aligned and repeated to confirm treatment position for each of the isocenters.  The Exactrac snap film verification was repeated at each couch angle.  SPECIAL TREATMENT PROCEDURE: Denise [name] received stereotactic radiosurgery to the following targets: Left sphenoid wing meningioma received 5 Gray   Treated with radiosurgery, 6 MV photons, flattening filter free, IMRT.  ExacTrac Snap verification was performed for each couch angle.  This constitutes a special treatment procedure due to the ablative dose delivered and the technical nature of treatment.  This highly technical modality of treatment ensures that the ablative dose is centered on the patient's tumor while sparing normal tissues from excessive dose and risk of detrimental effects.  STEREOTACTIC TREATMENT  MANAGEMENT:  Following delivery, the patient was transported to nursing in stable condition and monitored for possible acute effects.  Vital signs were recorded BP (!) 147/89 (BP Location: Left Wrist)   Pulse 72   Temp 98 F (36.7 C) (Oral)   Resp 19   SpO2 100% . The patient tolerated treatment without significant acute effects, and was discharged to home in stable condition.    PLAN: Follow-up in 2 days for next treatment.  ________________________________   Eppie Gibson, MD

## 2020-06-10 ENCOUNTER — Inpatient Hospital Stay (HOSPITAL_BASED_OUTPATIENT_CLINIC_OR_DEPARTMENT_OTHER): Payer: Medicare Other | Admitting: Hematology

## 2020-06-10 ENCOUNTER — Inpatient Hospital Stay (HOSPITAL_COMMUNITY): Payer: Medicare Other

## 2020-06-10 VITALS — BP 139/72 | HR 62 | Temp 96.8°F | Resp 20 | Wt 281.2 lb

## 2020-06-10 VITALS — BP 143/83 | HR 69 | Temp 97.0°F | Resp 18

## 2020-06-10 DIAGNOSIS — C3492 Malignant neoplasm of unspecified part of left bronchus or lung: Secondary | ICD-10-CM

## 2020-06-10 DIAGNOSIS — M25561 Pain in right knee: Secondary | ICD-10-CM | POA: Diagnosis not present

## 2020-06-10 DIAGNOSIS — M7989 Other specified soft tissue disorders: Secondary | ICD-10-CM | POA: Diagnosis not present

## 2020-06-10 DIAGNOSIS — C3412 Malignant neoplasm of upper lobe, left bronchus or lung: Secondary | ICD-10-CM | POA: Diagnosis not present

## 2020-06-10 DIAGNOSIS — Z5112 Encounter for antineoplastic immunotherapy: Secondary | ICD-10-CM | POA: Diagnosis not present

## 2020-06-10 DIAGNOSIS — I313 Pericardial effusion (noninflammatory): Secondary | ICD-10-CM | POA: Diagnosis not present

## 2020-06-10 DIAGNOSIS — Z95828 Presence of other vascular implants and grafts: Secondary | ICD-10-CM

## 2020-06-10 DIAGNOSIS — I10 Essential (primary) hypertension: Secondary | ICD-10-CM | POA: Diagnosis not present

## 2020-06-10 LAB — CBC WITH DIFFERENTIAL/PLATELET
Abs Immature Granulocytes: 0.01 10*3/uL (ref 0.00–0.07)
Basophils Absolute: 0 10*3/uL (ref 0.0–0.1)
Basophils Relative: 0 %
Eosinophils Absolute: 0.1 10*3/uL (ref 0.0–0.5)
Eosinophils Relative: 1 %
HCT: 38.1 % (ref 36.0–46.0)
Hemoglobin: 11.8 g/dL — ABNORMAL LOW (ref 12.0–15.0)
Immature Granulocytes: 0 %
Lymphocytes Relative: 12 %
Lymphs Abs: 0.7 10*3/uL (ref 0.7–4.0)
MCH: 27.2 pg (ref 26.0–34.0)
MCHC: 31 g/dL (ref 30.0–36.0)
MCV: 87.8 fL (ref 80.0–100.0)
Monocytes Absolute: 0.4 10*3/uL (ref 0.1–1.0)
Monocytes Relative: 7 %
Neutro Abs: 4.4 10*3/uL (ref 1.7–7.7)
Neutrophils Relative %: 80 %
Platelets: 304 10*3/uL (ref 150–400)
RBC: 4.34 MIL/uL (ref 3.87–5.11)
RDW: 15.6 % — ABNORMAL HIGH (ref 11.5–15.5)
WBC: 5.5 10*3/uL (ref 4.0–10.5)
nRBC: 0 % (ref 0.0–0.2)

## 2020-06-10 LAB — COMPREHENSIVE METABOLIC PANEL
ALT: 16 U/L (ref 0–44)
AST: 17 U/L (ref 15–41)
Albumin: 3.3 g/dL — ABNORMAL LOW (ref 3.5–5.0)
Alkaline Phosphatase: 71 U/L (ref 38–126)
Anion gap: 8 (ref 5–15)
BUN: 10 mg/dL (ref 8–23)
CO2: 24 mmol/L (ref 22–32)
Calcium: 8.8 mg/dL — ABNORMAL LOW (ref 8.9–10.3)
Chloride: 108 mmol/L (ref 98–111)
Creatinine, Ser: 0.73 mg/dL (ref 0.44–1.00)
GFR, Estimated: >60 mL/min (ref >60)
Glucose, Bld: 102 mg/dL — ABNORMAL HIGH (ref 70–99)
Potassium: 3.9 mmol/L (ref 3.5–5.1)
Sodium: 140 mmol/L (ref 135–145)
Total Bilirubin: 0.4 mg/dL (ref 0.3–1.2)
Total Protein: 6.3 g/dL — ABNORMAL LOW (ref 6.5–8.1)

## 2020-06-10 LAB — TSH: TSH: 1.974 u[IU]/mL (ref 0.350–4.500)

## 2020-06-10 MED ORDER — SODIUM CHLORIDE 0.9% FLUSH
10.0000 mL | INTRAVENOUS | Status: DC | PRN
  Administered 2020-06-10: 10 mL

## 2020-06-10 MED ORDER — SODIUM CHLORIDE 0.9 % IV SOLN
10.0000 mg/kg | Freq: Once | INTRAVENOUS | Status: AC
  Administered 2020-06-10: 1240 mg via INTRAVENOUS
  Filled 2020-06-10: qty 20

## 2020-06-10 MED ORDER — HEPARIN SOD (PORK) LOCK FLUSH 100 UNIT/ML IV SOLN
500.0000 [IU] | Freq: Once | INTRAVENOUS | Status: AC | PRN
  Administered 2020-06-10: 500 [IU]

## 2020-06-10 MED ORDER — SODIUM CHLORIDE 0.9 % IV SOLN: Freq: Once | INTRAVENOUS | Status: AC

## 2020-06-10 MED ORDER — SODIUM CHLORIDE 0.9% FLUSH
10.0000 mL | INTRAVENOUS | Status: DC | PRN
  Administered 2020-06-10: 10 mL via INTRAVENOUS

## 2020-06-10 NOTE — Patient Instructions (Signed)
Durvalumab infusion What is this medicine? DURVALUMAB (dur VAL ue mab) is a monoclonal antibody. It is used to treat lung cancer. This medicine may be used for other purposes; ask your health care provider or pharmacist if you have questions. COMMON BRAND NAME(S): IMFINZI What should I tell my health care provider before I take this medicine? They need to know if you have any of these conditions:  autoimmune diseases like Crohn's disease, ulcerative colitis, or lupus  have had or planning to have an allogeneic stem cell transplant (uses someone else's stem cells)  history of organ transplant  history of radiation to the chest  nervous system problems like myasthenia gravis or Guillain-Barre syndrome  an unusual or allergic reaction to durvalumab, other medicines, foods, dyes, or preservatives  pregnant or trying to get pregnant  breast-feeding How should I use this medicine? This medicine is for infusion into a vein. It is given by a health care professional in a hospital or clinic setting. A special MedGuide will be given to you before each treatment. Be sure to read this information carefully each time. Talk to your pediatrician regarding the use of this medicine in children. Special care may be needed. Overdosage: If you think you have taken too much of this medicine contact a poison control center or emergency room at once. NOTE: This medicine is only for you. Do not share this medicine with others. What if I miss a dose? It is important not to miss your dose. Call your doctor or health care professional if you are unable to keep an appointment. What may interact with this medicine? Interactions have not been studied. This list may not describe all possible interactions. Give your health care provider a list of all the medicines, herbs, non-prescription drugs, or dietary supplements you use. Also tell them if you smoke, drink alcohol, or use illegal drugs. Some items may interact  with your medicine. What should I watch for while using this medicine? This drug may make you feel generally unwell. Continue your course of treatment even though you feel ill unless your doctor tells you to stop. You may need blood work done while you are taking this medicine. Do not become pregnant while taking this medicine or for 3 months after stopping it. Women should inform their doctor if they wish to become pregnant or think they might be pregnant. There is a potential for serious side effects to an unborn child. Talk to your health care professional or pharmacist for more information. Do not breast-feed an infant while taking this medicine or for 3 months after stopping it. What side effects may I notice from receiving this medicine? Side effects that you should report to your doctor or health care professional as soon as possible:  allergic reactions like skin rash, itching or hives, swelling of the face, lips, or tongue  black, tarry stools  bloody or watery diarrhea  breathing problems  change in emotions or moods  change in sex drive  changes in vision  chest pain or chest tightness  chills  confusion  cough  facial flushing  fever  headache  signs and symptoms of high blood sugar such as dizziness; dry mouth; dry skin; fruity breath; nausea; stomach pain; increased hunger or thirst; increased urination  signs and symptoms of liver injury like dark yellow or brown urine; general ill feeling or flu-like symptoms; light-colored stools; loss of appetite; nausea; right upper belly pain; unusually weak or tired; yellowing of the eyes or skin  stomach pain  trouble passing urine or change in the amount of urine  weight gain or weight loss Side effects that usually do not require medical attention (report these to your doctor or health care professional if they continue or are bothersome):  bone pain  constipation  loss of appetite  muscle  pain  nausea  swelling of the ankles, feet, hands  tiredness This list may not describe all possible side effects. Call your doctor for medical advice about side effects. You may report side effects to FDA at 1-800-FDA-1088. Where should I keep my medicine? This drug is given in a hospital or clinic and will not be stored at home. NOTE: This sheet is a summary. It may not cover all possible information. If you have questions about this medicine, talk to your doctor, pharmacist, or health care provider.  2021 Elsevier/Gold Standard (2019-05-15 13:01:29)

## 2020-06-10 NOTE — Patient Instructions (Signed)
[place] at [facility] Discharge Instructions  You were seen today by Dr. Delton Coombes. He went over your recent results and scans. You received your treatment today; continue getting your treatment every 2 weeks. Dr. Delton Coombes will see you back in 1 month for labs and follow up.   Thank you for choosing [place] at [facility] to provide your oncology and hematology care.  To afford each patient quality time with our provider, please arrive at least 15 minutes before your scheduled appointment time.   If you have a lab appointment with the [facility] please come in thru the Main Entrance and check in at the main information desk  You need to re-schedule your appointment should you arrive 10 or more minutes late.  We strive to give you quality time with our providers, and arriving late affects you and other patients whose appointments are after yours.  Also, if you no show three or more times for appointments you may be dismissed from the clinic at the providers discretion.     Again, thank you for choosing [facility].  Our hope is that these requests will decrease the amount of time that you wait before being seen by our physicians.       _____________________________________________________________  Should you have questions after your visit to [facility], please contact our office at [phone] between the hours of 8:00 a.m. and 4:30 p.m.  Voicemails left after 4:00 p.m. will not be returned until the following business day.  For prescription refill requests, have your pharmacy contact our office and allow 72 hours.    Cancer Center Support Programs:   > Cancer Support Group  2nd Tuesday of the month 1pm-2pm, Journey Room

## 2020-06-10 NOTE — Progress Notes (Signed)
Patient here today for port flush with lab work, physician appointment and treatment.  Port accessed and labs drawn without incidence.  Patient tolerated well.  Port left accessed for treatment. She was sent to the waiting room ambulatory and in stable condition.

## 2020-06-10 NOTE — Progress Notes (Signed)
Nurse monitoring complete status post 4 of 5 SRS treatments. Patient without complaints. Patient denies new or worsening neurologic symptoms. Vitals stable. Instructed patient to avoid strenuous activity for the next 24 hours. Instructed patient to call [phone] with needs related to treatment after hours or over the weekend. Patient verbalized understanding. Ambulated out of clinic with cane unassisted to family member's vehicle without incident   BP 131/71 (BP Location: Left Arm, Patient Position: Sitting, Cuff Size: Large)   Pulse 81   Temp 97.8 F (36.6 C)   Resp 20   SpO2 98%

## 2020-06-10 NOTE — Progress Notes (Signed)
Patient presents today for Imfinzi infusion.  Vital signs within parameters for treatment.  No new complaints since last visit.  Imfinzi infusion given today per MD orders.  Stable during infusion without adverse affects.  Vital signs stable.  No complaints at this time.  Discharge from clinic ambulatory in stable condition.  Alert and oriented X 3.  Follow up with [facility] as scheduled.

## 2020-06-10 NOTE — Progress Notes (Signed)
[address], [place] [address]   CLINIC:  Medical Oncology/Hematology  PCP:  Iona Beard, [address] / [place] [address] [phone]   REASON FOR VISIT:  Follow-up for stage III left lung adenocarcinoma  PRIOR THERAPY:  1. Chemoradiation with carboplatin and paclitaxel from 01/07/2019 to 02/11/2019. 2. Consolidation with durvalumab from 03/19/2019 to 04/16/2019, held due to pneumonitis.  NGS Results: Not done  CURRENT THERAPY: Consolidation with durvalumab every 2 weeks; brain mets SRS in 5 fractions  BRIEF ONCOLOGIC HISTORY:  Oncology History  Adenocarcinoma of left lung ([facility])  12/09/2018 Initial Diagnosis   Adenocarcinoma of left lung ([facility])   01/02/2019 Cancer Staging   Staging form: Lung, AJCC 8th Edition - Clinical stage from 01/02/2019: Stage IIIB (cT3, cN2, cM0) - Signed by Derek Jack, MD on 01/02/2019   01/07/2019 - 02/11/2019 Chemotherapy   The patient had palonosetron (ALOXI) injection 0.25 mg, 0.25 mg, Intravenous,  Once, 6 of 6 cycles Administration: 0.25 mg (01/07/2019), 0.25 mg (01/14/2019), 0.25 mg (01/21/2019), 0.25 mg (01/28/2019), 0.25 mg (02/04/2019), 0.25 mg (02/11/2019) CARBOplatin (PARAPLATIN) 270 mg in sodium chloride 0.9 % 250 mL chemo infusion, 270 mg (100 % of original dose 266.4 mg), Intravenous,  Once, 6 of 6 cycles Dose modification:   (original dose 266.4 mg, Cycle 1),   (original dose 266.4 mg, Cycle 2),   (original dose 266.4 mg, Cycle 3),   (original dose 266.4 mg, Cycle 4) Administration: 270 mg (01/07/2019), 270 mg (01/14/2019), 270 mg (01/21/2019), 270 mg (01/28/2019), 270 mg (02/04/2019), 270 mg (02/11/2019) PACLitaxel (TAXOL) 108 mg in sodium chloride 0.9 % 250 mL chemo infusion (</= 80mg /m2), 45 mg/m2 = 108 mg, Intravenous,  Once, 6 of 6 cycles Administration: 108 mg (01/07/2019), 108 mg (01/14/2019), 108 mg (01/21/2019), 108 mg (01/28/2019), 108 mg (02/04/2019), 108 mg  (02/11/2019) fosaprepitant (EMEND) 150 mg, dexamethasone (DECADRON) 12 mg in sodium chloride 0.9 % 145 mL IVPB, , Intravenous,  Once, 5 of 5 cycles Administration:  (01/14/2019),  (01/21/2019),  (01/28/2019),  (02/04/2019),  (02/11/2019)  for chemotherapy treatment.    03/19/2019 -  Chemotherapy    Patient is on Treatment Plan: LUNG DURVALUMAB Q14D        CANCER STAGING: Cancer Staging Adenocarcinoma of left lung [facility]) Staging form: Lung, AJCC 8th Edition - Clinical stage from 01/02/2019: Stage IIIB (cT3, cN2, cM0) - Signed by Derek Jack, MD on 01/02/2019   INTERVAL HISTORY:  Denise [name], a 70 y.o. female, returns for routine follow-up and consideration for next cycle of immunotherapy. Denise [name] was last seen on 05/13/2020.  Due for cycle #27 of durvalumab today.   Overall, she tells me she has been feeling pretty well. She has started radiation on 03/18 and will receive 5 treatments with Dr. Isidore Moos. She denies having headaches or diarrhea.  Overall, she feels ready for next cycle of immunotherapy today.    REVIEW OF SYSTEMS:  Review of Systems  Constitutional: Positive for fatigue (75%). Negative for appetite change.  Gastrointestinal: Negative for diarrhea.  Neurological: Negative for headaches.  All other systems reviewed and are negative.   PAST MEDICAL/SURGICAL HISTORY:  Past Medical History:  Diagnosis Date  . Anemia   . Aortic stenosis   . Arthritis   . Cancer ([facility])    Lung  . Coronary artery calcification seen on CT scan   . Essential hypertension   . GERD (gastroesophageal reflux disease)   . History of lung cancer  Stage III adenocarcinoma status post chemoradiation  . Port-A-Cath in place 01/06/2019  . Type 2 diabetes mellitus ([facility])    Past Surgical History:  Procedure Laterality Date  . CHOLECYSTECTOMY  1997  . COLONOSCOPY    . GASTRIC BYPASS    . INCISIONAL HERNIA REPAIR  04/11/11  . IR IMAGING GUIDED PORT INSERTION  12/27/2018   Right   . LAPAROSCOPIC SALPINGOOPHERECTOMY    . LAPAROTOMY  04/11/2011   Procedure: EXPLORATORY LAPAROTOMY;  Surgeon: Joyice Faster. Cornett, MD;  Location: [facility];  Service: General;  Laterality: N/A;  closure port hole  . RIGHT/LEFT HEART CATH AND CORONARY ANGIOGRAPHY N/A 12/29/2019   Procedure: RIGHT/LEFT HEART CATH AND CORONARY ANGIOGRAPHY;  Surgeon: Troy Sine, MD;  Location: [facility];  Service: Cardiovascular;  Laterality: N/A;  . TOTAL HIP ARTHROPLASTY  03/07/2012   Procedure: TOTAL HIP ARTHROPLASTY ANTERIOR APPROACH;  Surgeon: Mauri Pole, MD;  Location: [facility];  Service: Orthopedics;  Laterality: Right;  . TOTAL SHOULDER ARTHROPLASTY Left 01/21/2015  . TOTAL SHOULDER ARTHROPLASTY Left 01/21/2015   Procedure: LEFT TOTAL SHOULDER ARTHROPLASTY;  Surgeon: Justice Britain, MD;  Location: [facility];  Service: Orthopedics;  Laterality: Left;  Marland Kitchen VAGINAL HYSTERECTOMY      SOCIAL HISTORY:  Social History   Socioeconomic History  . Marital status: Single    Spouse name: Not on file  . Number of children: Not on file  . Years of education: 12th grade  . Highest education level: Not on file  Occupational History  . Occupation: Employed    Employer: [employer]  Tobacco Use  . Smoking status: Former Smoker    Packs/day: 1.00    Years: 12.00    Pack years: 12.00    Types: Cigarettes    Quit date: 03/20/1976    Years since quitting: 44.2  . Smokeless tobacco: Never Used  Vaping Use  . Vaping Use: Never used  Substance and Sexual Activity  . Alcohol use: No  . Drug use: No  . Sexual activity: Not Currently  Other Topics Concern  . Not on file  Social History Narrative  . Not on file   Social Determinants of Health   Financial Resource Strain: Not on file  Food Insecurity: Not on file  Transportation Needs: Not on file  Physical Activity: Not on file  Stress: Not on file  Social Connections: Not on file  Intimate Partner Violence: Not on file    FAMILY HISTORY:   Family History  Problem Relation Age of Onset  . Breast cancer Mother   . COPD Mother   . Arthritis Mother   . Diabetes Mother   . Hypertension Mother   . Hypertension Father   . Diabetes Father   . Breast cancer Sister   . Thyroid cancer Brother   . Huntington's disease Maternal Grandmother   . Heart attack Brother     CURRENT MEDICATIONS:  Current Outpatient Medications  Medication Sig Dispense Refill  . Acetaminophen (TYLENOL 8 HOUR ARTHRITIS PAIN PO) Take 650 ng by mouth 2 (two) times daily.     Marland Kitchen albuterol (VENTOLIN HFA) 108 (90 Base) MCG/ACT inhaler Inhale into the lungs.    Marland Kitchen amLODipine (NORVASC) 5 MG tablet Take 5 mg by mouth daily.     Marland Kitchen aspirin EC 81 MG tablet Take 81 mg by mouth daily. Swallow whole.    . Calcium Carbonate Antacid (TUMS PO) Take 4 tablets by mouth daily.    . carboxymethylcellul-glycerin (OPTIVE) 0.5-0.9 %  ophthalmic solution Place 1-2 drops into both eyes 3 (three) times daily as needed (dry/irritated eyes.).    Marland Kitchen carvedilol (COREG) 3.125 MG tablet TAKE 1 TABLET BY MOUTH 2 TIMES DAILY. 180 tablet 2  . Cyanocobalamin (VITAMIN B-12) 2500 MCG SUBL Place 2,500 mcg under the tongue every morning.     . diphenhydrAMINE (BENADRYL) 25 MG tablet Take 25 mg by mouth every 6 (six) hours as needed for itching or allergies.    Hunt Oris IV Inject into the vein every 14 (fourteen) days.    . ferrous sulfate 325 (65 FE) MG tablet Take 325 mg by mouth every evening.    . furosemide (LASIX) 20 MG tablet TAKE 1 TABLET BY MOUTH AS NEEDED FOR FLUID OR EDEMA (DAILY AS NEEDED FOR SWELLING). 90 tablet 1  . gabapentin (NEURONTIN) 300 MG capsule Take by mouth.    Marland Kitchen HYDROcodone-acetaminophen (NORCO/VICODIN) 5-325 MG tablet Take 1 tablet by mouth 2 (two) times daily as needed.    . lidocaine-prilocaine (EMLA) cream APPLY SMALL AMOUNT TO PORT A CATH SITE AND COVER WITH PLASTIC WRAP 1 HR PRIOR TO CHEMOTHERAPY APPTS 30 g 3  . Multiple Vitamin (MULITIVITAMIN WITH MINERALS) TABS  Take 1 tablet by mouth daily.    . naproxen (NAPROSYN) 500 MG tablet Take 500 mg by mouth every 12 (twelve) hours.     Marland Kitchen omeprazole (PRILOSEC) 20 MG capsule Take 20 mg by mouth daily.     Current Facility-Administered Medications  Medication Dose Route Frequency Provider Last Rate Last Admin  . sodium chloride flush (NS) 0.9 % injection 3 mL  3 mL Intravenous Q12H Satira Sark, MD        ALLERGIES:  No Known Allergies  PHYSICAL EXAM:  Performance status (ECOG): 1 - Symptomatic but completely ambulatory  Vitals:   06/10/20 1014  BP: 139/72  Pulse: 62  Resp: 20  Temp: (!) 96.8 F (36 C)  SpO2: 100%   Wt Readings from Last 3 Encounters:  06/10/20 281 lb 3.2 oz (127.6 kg)  05/28/20 282 lb 3.2 oz (128 kg)  05/17/20 285 lb 8 oz (129.5 kg)   Physical Exam Vitals reviewed.  Constitutional:      Appearance: Normal appearance. She is obese.  Cardiovascular:     Rate and Rhythm: Normal rate and regular rhythm.     Pulses: Normal pulses.     Heart sounds: Normal heart sounds.  Pulmonary:     Effort: Pulmonary effort is normal.     Breath sounds: Normal breath sounds.  Chest:     Comments: Port-a-Cath in R chest Neurological:     General: No focal deficit present.     Mental Status: She is alert and oriented to person, place, and time.  Psychiatric:        Mood and Affect: Mood normal.        Behavior: Behavior normal.     LABORATORY DATA:  I have reviewed the labs as listed.  CBC Latest Ref Rng & Units 06/10/2020 05/28/2020 05/13/2020  WBC 4.0 - 10.5 K/uL 5.5 8.3 4.8  Hemoglobin 12.0 - 15.0 g/dL 11.8(L) 12.1 11.8(L)  Hematocrit 36.0 - 46.0 % 38.1 39.8 38.5  Platelets 150 - 400 K/uL 304 367 334   CMP Latest Ref Rng & Units 06/10/2020 05/28/2020 05/13/2020  Glucose 70 - 99 mg/dL 102(H) 107(H) 110(H)  BUN 8 - 23 mg/dL 10 17 12   Creatinine 0.44 - 1.00 mg/dL 0.73 0.73 0.72  Sodium 135 - 145 mmol/L 140 142  142  Potassium 3.5 - 5.1 mmol/L 3.9 4.2 3.9  Chloride 98 - 111  mmol/L 108 108 106  CO2 22 - 32 mmol/L 24 25 27   Calcium 8.9 - 10.3 mg/dL 8.8(L) 9.3 9.3  Total Protein 6.5 - 8.1 g/dL 6.3(L) 6.8 6.4(L)  Total Bilirubin 0.3 - 1.2 mg/dL 0.4 0.3 0.3  Alkaline Phos 38 - 126 U/L 71 79 73  AST 15 - 41 U/L 17 21 17   ALT 0 - 44 U/L 16 21 15     DIAGNOSTIC IMAGING:  I have independently reviewed the scans and discussed with the patient. CT Chest W Contrast  Result Date: 06/08/2020 CLINICAL DATA:  Non-small-cell lung cancer.  Restaging. EXAM: CT CHEST WITH CONTRAST TECHNIQUE: Multidetector CT imaging of the chest was performed during intravenous contrast administration. CONTRAST:  13mL OMNIPAQUE IOHEXOL 300 MG/ML  SOLN COMPARISON:  03/02/2020 FINDINGS: Cardiovascular: The heart size is normal. Trace pericardial effusion. Atherosclerotic calcification is noted in the wall of the thoracic aorta. Right Port-A-Cath tip is positioned at the SVC/RA junction. Mediastinum/Nodes: No mediastinal lymphadenopathy. There is no hilar lymphadenopathy. Irregular soft tissue in the left hilum is compatible with post treatment scarring. The esophagus has normal imaging features. There is no axillary lymphadenopathy. 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 Nodular lesion measured previously at 2.0 x 1.6 cm is similar today at 2.0 x 1.3 cm. Subtle mosaic attenuation in the right lower lobe remains stable. No new or progressive lung findings on today's study. No pleural effusion. Upper Abdomen: Similar appearance of intra and extrahepatic biliary duct dilatation. Small hypoattenuating liver lesions appear similar in the interval, likely benign. Main pancreatic duct is mildly distended at 3-4 mm in the head of pancreas. Gastric bypass anatomy noted in the stomach. Musculoskeletal: No worrisome lytic or sclerotic osseous abnormality. Degenerative changes noted right shoulder. Left shoulder replacement. IMPRESSION: 1. Left upper lobe pulmonary nodule measures minimally  smaller today with similar appearance of surrounding post radiation scarring. 2. No new or progressive findings in the chest. 3. Intra and extrahepatic biliary duct dilatation with mild dilatation of the main pancreatic duct. Imaging features not substantially different than abdomen CT of 04/11/2011. 4.  Aortic Atherosclerois (ICD10-170.0) Electronically Signed   By: Misty Stanley M.D.   On: 06/08/2020 13:25   MR Brain W Wo Contrast  Addendum Date: 06/04/2020   ADDENDUM REPORT: 06/04/2020 16:05 ADDENDUM: A focal cortical lesion the left middle frontal lobe gyrus on image 212 of series 1100 is stable at 4 mm. A second more anterior lesion in the left frontal lobe med image 180 is also stable 3 mm. Impressions: 2 small 3 mm cortical based lesions in the anterior left frontal lobe are compatible with metastases known lung cancer. These findings were discussed by Dr. Lissa Merlin is with Dr. Isidore Moos 06/02/20 Electronically Signed   By: San Morelle M.D.   On: 06/04/2020 16:05   Result Date: 06/04/2020 CLINICAL DATA:  Meningioma. Status post SRS. EXAM: MRI HEAD WITHOUT AND WITH CONTRAST TECHNIQUE: Multiplanar, multiecho pulse sequences of the brain and surrounding structures were obtained without and with intravenous contrast. CONTRAST:  38mL GADAVIST GADOBUTROL 1 MMOL/ML IV SOLN COMPARISON:  MR head without and with contrast 05/14/2020 and 07/18/2019 FINDINGS: Brain: Left sphenoid wing meningioma is stable in size measuring 3.0 x 2.2 x 2.8 cm. Tumor extends to the left cavernous sinus without extension into the cavernous sinus. Local mass effect is present with some uncal herniation. T2 signal is present in the left  temporal white matter extending into the left parietal lobe. No additional lesions are present. No acute infarct or hemorrhage is present. There is some mass effect on the left lateral ventricle. 2 mm midline shift noted. The internal auditory canals are within normal limits. The brainstem and  cerebellum are within normal limits. Vascular: The left MCA is immediately adjacent to the tumor. Flow is present in the major intracranial arteries. Skull and upper cervical spine: The craniocervical junction is normal. Upper cervical spine is within normal limits. Marrow signal is unremarkable. Sinuses/Orbits: The globes and orbits are within normal limits. The paranasal sinuses and mastoid air cells are clear. Other: IMPRESSION: 1. Stable left sphenoid wing meningioma with local mass effect and 2 mm midline shift. 2. Stable white matter T2 signal abnormality in the left temporal white matter extending into the left parietal lobe secondary to mass effect. Electronically Signed: By: San Morelle M.D. On: 05/29/2020 17:35   MR BRAIN W WO CONTRAST  Addendum Date: 06/02/2020   ADDENDUM REPORT: 06/02/2020 16:01 ADDENDUM: On further review, there is are two small (3-4 mm) enhancing lesions in the left frontal cortex (series 18, image 92 and series 18, image 117) with subtle associated edema on FLAIR, concerning for metastases in this patient with a history of lung cancer. Findings discussed with Dr. Isidore Moos at 3:35 p.m. on 06/02/2020. Electronically Signed   By: Margaretha Sheffield MD   On: 06/02/2020 16:01   Result Date: 06/02/2020 CLINICAL DATA:  Brain/CNS neoplasm, assess treatment response. EXAM: MRI HEAD WITHOUT AND WITH CONTRAST TECHNIQUE: Multiplanar, multiecho pulse sequences of the brain and surrounding structures were obtained without and with intravenous contrast. CONTRAST:  73mL GADAVIST GADOBUTROL 1 MMOL/ML IV SOLN COMPARISON:  MRI head April 30, 21. FINDINGS: Brain: Increased size of the left sphenoid wing meningioma measuring 3.0 x 2.4 x 3.1 cm (previously 2.6 x 2.0 x 2.6 cm). Superiorly and anteriorly, enhancing tumor extends slightly into the posterior aspect of the anterior left cranial fossa (series 19, image 20). Anteromedially, enhancing tumor abuts the left cavernous sinus. There is  slight enhancement of the adjacent left sphenoid wing, suggesting an element of bony involvement. There is increased surrounding vasogenic edema, which extends slightly farther posteriorly in the temporal lobe. Similar extension of edema into the white matter tracts of the left basal ganglia. There is slight associated rightward uncal herniation. The basal cisterns and suprasellar cistern are patent. Partial effacement of the temporal horn and atrium of the left lateral ventricle without evidence of hydrocephalus. No midline shift. No acute infarct. No acute hemorrhage. No extra-axial fluid collection. Vascular: Major arterial flow voids are maintained at the skull base. Left MCA branches are inseparable from the mass. Skull and upper cervical spine: Normal marrow signal. Sinuses/Orbits: Clear sinuses.  Unremarkable orbits. Other: No sizable mastoid effusions. IMPRESSION: Increased size of the left sphenoid wing meningioma and increased surrounding vasogenic edema, as detailed above. Electronically Signed: By: Margaretha Sheffield MD On: 05/14/2020 18:13     ASSESSMENT:  1.Adenocarcinoma of left lung ([facility]) -Chemoradiation therapy with carboplatin and paclitaxel from 01/07/2019 through 02/11/2019. -Consolidation immunotherapy with durvalumab from 03/19/2019 through 04/15/2018, held due to pneumonitis. -CT chest on 07/02/2019 shows left upper lobe lung mass measuring 2.6 x 2.0 cm. It shows improvement in size. -MRI of the brain on 07/18/2019 showed left sphenoid wing meningioma measuring 2.6 x 2.0 x 2.6 cm unchanged. No new enhancing intracranial lesion. Increased left temporal white matter edema. -Durvalumab restarted on 07/24/2019. -We reviewed CT chest  with contrast from 09/29/2019 which showed stable appearance of treated lung lesion within the posterior left upper lobe. New bandlike area of increased soft tissue density within the anterior to the treated lung lesion favored to be progressive changes from  radiation. Local tumor recurrence is less favored. Stable pericardial effusion. -CT chest on 12/10/2019 shows posttreatment appearance of the left lung with associated scarring and volume loss. No evidence of recurrence or metastatic disease. Unchanged small pericardial effusion. -She was evaluated by cardiology with a stress test. EF was 46%. She underwent cardiac catheterization which did not show any abnormalities.   PLAN:  1. Clinical stage IIIb (T3N2) adenocarcinoma of the lung: -She denies any immunotherapy related side effects. -Shortness of breath on exertion is stable. -Reviewed labs which showed normal LFTs and CBC.  TSH is 1.9. -Reviewed CT chest with contrast from 06/07/2020 which showed left upper lobe pulmonary nodule minimally smaller compared to last scan.  No new or progressive findings in the chest.  Intra and extrahepatic biliary ductal dilatation with mild dilation of the pancreatic duct stable since 2013. -Proceed with treatment today and in 2 weeks.  RTC 4 weeks. -She will finish durvalumab in May.  2. Shortness of breath on exertion: -Continue follow-up with pulmonary.  Continue inhalers.  3.Meningioma/brain mets: -Recent MRI showed two 3 mm lesions. -She completed 3 out of 5 SRS treatments to the brain.  4. Hypertension: -Continue Norvasc.  Blood pressure today is 139/72.  5. Bilateral leg swelling: -Continue Lasix as needed.  6. Bilateral knee pains, right more than left: -Pain from bilateral osteoarthritis.  Continue to follow-up with Dr. Para March.   Orders placed this encounter:  No orders of the defined types were placed in this encounter.    Derek Jack, MD [place] [phone]   I, Milinda Antis, am acting as a scribe for Dr. Sanda Linger.  I, Derek Jack MD, have reviewed the above documentation for accuracy and completeness, and I agree with the above.

## 2020-06-10 NOTE — Progress Notes (Signed)
Patient was assessed by Dr. Katragadda and labs have been reviewed.  Patient is okay to proceed with treatment today. Primary RN and pharmacy aware.   

## 2020-06-11 ENCOUNTER — Ambulatory Visit
Admission: RE | Admit: 2020-06-11 | Discharge: 2020-06-11 | Disposition: A | Discharge status: Home / Self Care | Payer: Medicare Other | Source: Ambulatory Visit | Attending: Radiation Oncology | Admitting: Radiation Oncology

## 2020-06-11 ENCOUNTER — Other Ambulatory Visit: Payer: Self-pay

## 2020-06-11 DIAGNOSIS — D320 Benign neoplasm of cerebral meninges: Secondary | ICD-10-CM | POA: Diagnosis not present

## 2020-06-11 DIAGNOSIS — Z452 Encounter for adjustment and management of vascular access device: Secondary | ICD-10-CM | POA: Diagnosis not present

## 2020-06-11 DIAGNOSIS — C7931 Secondary malignant neoplasm of brain: Secondary | ICD-10-CM | POA: Diagnosis not present

## 2020-06-11 DIAGNOSIS — Z510 Encounter for antineoplastic radiation therapy: Secondary | ICD-10-CM | POA: Diagnosis not present

## 2020-06-14 ENCOUNTER — Other Ambulatory Visit: Payer: Self-pay

## 2020-06-14 ENCOUNTER — Ambulatory Visit
Admission: RE | Admit: 2020-06-14 | Discharge: 2020-06-14 | Disposition: A | Discharge status: Home / Self Care | Payer: Medicare Other | Source: Ambulatory Visit | Attending: Radiation Oncology | Admitting: Radiation Oncology

## 2020-06-14 ENCOUNTER — Encounter: Payer: Self-pay | Admitting: Radiation Oncology

## 2020-06-14 VITALS — BP 144/90 | HR 82 | Temp 96.8°F | Resp 19

## 2020-06-14 DIAGNOSIS — Z510 Encounter for antineoplastic radiation therapy: Secondary | ICD-10-CM | POA: Diagnosis not present

## 2020-06-14 DIAGNOSIS — D320 Benign neoplasm of cerebral meninges: Secondary | ICD-10-CM

## 2020-06-14 DIAGNOSIS — Z452 Encounter for adjustment and management of vascular access device: Secondary | ICD-10-CM | POA: Diagnosis not present

## 2020-06-14 DIAGNOSIS — C7931 Secondary malignant neoplasm of brain: Secondary | ICD-10-CM | POA: Diagnosis not present

## 2020-06-14 NOTE — Progress Notes (Signed)
Nurse monitoring complete status post 5 of 5 SRS treatments. Patient without complaints. Patient denies new or worsening neurologic symptoms. Vitals stable. Instructed patient to avoid strenuous activity for the next 24 hours. Instructed patient to call [phone] with needs related to treatment after hours or over the weekend. Patient verbalized understanding. Patient aware of 1 month F/U appointment with Dr. Isidore Moos. Patient ambulated out with clinic unasissted with cane to family member's vehicle without incident.   Vitals:   06/14/20 1515  BP: (!) 144/90  Pulse: 82  Resp: 19  Temp: (!) 96.8 F (36 C)  SpO2: 98%

## 2020-06-18 NOTE — Progress Notes (Signed)
  Radiation Oncology         [phone]) [phone] ________________________________  Name: Denise [name] MRN: [id number]  Date: 06/14/2020  DOB: [birth year]  Stereotactic Treatment Procedure Note  SPECIAL TREATMENT PROCEDURE  Outpatient      ICD-10-CM   1. Meningioma, cerebral ([place])  D32.0      3D TREATMENT PLANNING AND DOSIMETRY:  The patient's radiation plan was reviewed and approved by neurosurgery and radiation oncology prior to treatment.  It showed 3-dimensional radiation distributions overlaid onto the planning CT/MRI image set.  The [facility] for the target structures as well as the organs at risk were reviewed. The documentation of the 3D plan and dosimetry are filed in the radiation oncology EMR.  NARRATIVE:  Denise [name] was brought to the True[employer]am stereotactic radiation treatment machine and placed supine on the CT couch. The head frame was applied, and the patient was set up for stereotactic radiosurgery.  Neurosurgery was present for the set-up and delivery  SIMULATION VERIFICATION:  In the couch zero-angle position, the patient underwent Exactrac imaging using the Brainlab system with orthogonal KV images.  These were carefully aligned and repeated to confirm treatment position for each of the isocenters.  The Exactrac snap film verification was repeated at each couch angle.  SPECIAL TREATMENT PROCEDURE: Denise [name] received stereotactic radiosurgery to the following targets: Left sphenoid wing meningioma received 5 Gray   Treated with radiosurgery, 6 MV photons, flattening filter free, IMRT.  ExacTrac Snap verification was performed for each couch angle.  This constitutes a special treatment procedure due to the ablative dose delivered and the technical nature of treatment.  This highly technical modality of treatment ensures that the ablative dose is centered on the patient's tumor while sparing normal tissues from excessive dose and risk of detrimental effects.  STEREOTACTIC TREATMENT  MANAGEMENT:  Following delivery, the patient was transported to nursing in stable condition and monitored for possible acute effects.  Vital signs were recorded BP (!) 144/90 (BP Location: Left Arm, Patient Position: Sitting)   Pulse 82   Temp (!) 96.8 F (36 C) (Temporal)   Resp 19   SpO2 98% . The patient tolerated treatment without significant acute effects, and was discharged to home in stable condition.    PLAN: Follow-up in 4mo.  ________________________________   Eppie Gibson, MD

## 2020-06-24 ENCOUNTER — Encounter (HOSPITAL_COMMUNITY): Payer: Self-pay

## 2020-06-24 ENCOUNTER — Inpatient Hospital Stay (HOSPITAL_COMMUNITY): Payer: Medicare Other

## 2020-06-24 ENCOUNTER — Other Ambulatory Visit: Payer: Self-pay

## 2020-06-24 ENCOUNTER — Inpatient Hospital Stay (HOSPITAL_COMMUNITY): Payer: Medicare Other | Attending: Hematology

## 2020-06-24 VITALS — BP 125/57 | HR 77 | Resp 18

## 2020-06-24 DIAGNOSIS — C3492 Malignant neoplasm of unspecified part of left bronchus or lung: Secondary | ICD-10-CM

## 2020-06-24 DIAGNOSIS — Z5112 Encounter for antineoplastic immunotherapy: Secondary | ICD-10-CM | POA: Insufficient documentation

## 2020-06-24 DIAGNOSIS — Z95828 Presence of other vascular implants and grafts: Secondary | ICD-10-CM

## 2020-06-24 DIAGNOSIS — Z79899 Other long term (current) drug therapy: Secondary | ICD-10-CM | POA: Diagnosis not present

## 2020-06-24 DIAGNOSIS — C3412 Malignant neoplasm of upper lobe, left bronchus or lung: Secondary | ICD-10-CM | POA: Diagnosis not present

## 2020-06-24 LAB — COMPREHENSIVE METABOLIC PANEL
ALT: 18 U/L (ref 0–44)
AST: 18 U/L (ref 15–41)
Albumin: 3.6 g/dL (ref 3.5–5.0)
Alkaline Phosphatase: 75 U/L (ref 38–126)
Anion gap: 9 (ref 5–15)
BUN: 14 mg/dL (ref 8–23)
CO2: 25 mmol/L (ref 22–32)
Calcium: 9.3 mg/dL (ref 8.9–10.3)
Chloride: 110 mmol/L (ref 98–111)
Creatinine, Ser: 0.66 mg/dL (ref 0.44–1.00)
GFR, Estimated: >60 mL/min (ref >60)
Glucose, Bld: 104 mg/dL — ABNORMAL HIGH (ref 70–99)
Potassium: 3.8 mmol/L (ref 3.5–5.1)
Sodium: 144 mmol/L (ref 135–145)
Total Bilirubin: 0.5 mg/dL (ref 0.3–1.2)
Total Protein: 6.2 g/dL — ABNORMAL LOW (ref 6.5–8.1)

## 2020-06-24 LAB — CBC WITH DIFFERENTIAL/PLATELET
Abs Immature Granulocytes: 0.01 10*3/uL (ref 0.00–0.07)
Basophils Absolute: 0 10*3/uL (ref 0.0–0.1)
Basophils Relative: 1 %
Eosinophils Absolute: 0.1 10*3/uL (ref 0.0–0.5)
Eosinophils Relative: 3 %
HCT: 37.4 % (ref 36.0–46.0)
Hemoglobin: 11.5 g/dL — ABNORMAL LOW (ref 12.0–15.0)
Immature Granulocytes: 0 %
Lymphocytes Relative: 15 %
Lymphs Abs: 0.6 10*3/uL — ABNORMAL LOW (ref 0.7–4.0)
MCH: 27.3 pg (ref 26.0–34.0)
MCHC: 30.7 g/dL (ref 30.0–36.0)
MCV: 88.6 fL (ref 80.0–100.0)
Monocytes Absolute: 0.4 10*3/uL (ref 0.1–1.0)
Monocytes Relative: 8 %
Neutro Abs: 3.1 10*3/uL (ref 1.7–7.7)
Neutrophils Relative %: 73 %
Platelets: 313 10*3/uL (ref 150–400)
RBC: 4.22 MIL/uL (ref 3.87–5.11)
RDW: 15.9 % — ABNORMAL HIGH (ref 11.5–15.5)
WBC: 4.3 10*3/uL (ref 4.0–10.5)
nRBC: 0 % (ref 0.0–0.2)

## 2020-06-24 LAB — TSH: TSH: 0.84 u[IU]/mL (ref 0.350–4.500)

## 2020-06-24 MED ORDER — SODIUM CHLORIDE 0.9 % IV SOLN
Freq: Once | INTRAVENOUS | Status: AC
Start: 2020-06-24 — End: 2020-06-24

## 2020-06-24 MED ORDER — HEPARIN SOD (PORK) LOCK FLUSH 100 UNIT/ML IV SOLN
500.0000 [IU] | Freq: Once | INTRAVENOUS | Status: AC | PRN
  Administered 2020-06-24: 500 [IU]

## 2020-06-24 MED ORDER — SODIUM CHLORIDE 0.9 % IV SOLN
10.0000 mg/kg | Freq: Once | INTRAVENOUS | Status: AC
  Administered 2020-06-24: 1240 mg via INTRAVENOUS
  Filled 2020-06-24: qty 20

## 2020-06-24 MED ORDER — SODIUM CHLORIDE 0.9% FLUSH
10.0000 mL | INTRAVENOUS | Status: DC | PRN
  Administered 2020-06-24: 10 mL

## 2020-06-24 NOTE — Patient Instructions (Signed)
[facility] Discharge Instructions for Patients Receiving Chemotherapy   Beginning January 23rd 2017 lab work for the Cancer Center will be done in the  Main lab at [facility] on 1st floor. If you have a lab appointment with the Cancer Center please come in thru the  Main Entrance and check in at the main information desk   Today you received the following chemotherapy agents Imfinzi  To help prevent nausea and vomiting after your treatment, we encourage you to take your nausea medication   If you develop nausea and vomiting, or diarrhea that is not controlled by your medication, call the clinic.  The clinic phone number is [phone]. Office hours are Monday-Friday 8:30am-5:00pm.  BELOW ARE SYMPTOMS THAT SHOULD BE REPORTED IMMEDIATELY:  *FEVER GREATER THAN 101.0 F  *CHILLS WITH OR WITHOUT FEVER  NAUSEA AND VOMITING THAT IS NOT CONTROLLED WITH YOUR NAUSEA MEDICATION  *UNUSUAL SHORTNESS OF BREATH  *UNUSUAL BRUISING OR BLEEDING  TENDERNESS IN MOUTH AND THROAT WITH OR WITHOUT PRESENCE OF ULCERS  *URINARY PROBLEMS  *BOWEL PROBLEMS  UNUSUAL RASH Items with * indicate a potential emergency and should be followed up as soon as possible. If you have an emergency after office hours please contact your primary care physician or go to the nearest emergency department.  Please call the clinic during office hours if you have any questions or concerns.   You may also contact the Patient Navigator at [phone] should you have any questions or need assistance in obtaining follow up care.      Resources For Cancer Patients and their Caregivers ? American Cancer Society: Can assist with transportation, wigs, general needs, runs Look Good Feel Better.        1-888-227-6333 ? Cancer Care: Provides financial assistance, online support groups, medication/co-pay assistance.  1-800-813-HOPE (4673) ? [facility] Assists [place] cancer  patients and their families through emotional , educational and financial support.  336-427-4357 ? [place] DSS Where to apply for food stamps, Medicaid and utility assistance. 336-342-1394 ? [facility]: Transportation to medical appointments. 336-347-2287 ? Social Security Administration: May apply for disability if have a Stage IV cancer. 336-342-7796 1-800-772-1213 ? [place] Aging, Disability and Transit Services: Assists with nutrition, care and transit needs. 336-349-2343          

## 2020-06-24 NOTE — Progress Notes (Signed)
Denise [name] presents today for D1C29 Imfinzi. Pt denies any new changes or symptoms since last treatment. Lab results and vitals have been reviewed and are stable and within parameters for treatment. Proceeding with treatment today as planned.  Infusions tolerated without incident or complaint. VSS upon completion of treatment. Port flushed and deaccessed per protocol, see MAR and IV flowsheet for details. Discharged in satisfactory condition with follow up instructions.

## 2020-06-29 DIAGNOSIS — I2510 Atherosclerotic heart disease of native coronary artery without angina pectoris: Secondary | ICD-10-CM | POA: Diagnosis not present

## 2020-06-29 DIAGNOSIS — D329 Benign neoplasm of meninges, unspecified: Secondary | ICD-10-CM | POA: Diagnosis not present

## 2020-06-29 DIAGNOSIS — M150 Primary generalized (osteo)arthritis: Secondary | ICD-10-CM | POA: Diagnosis not present

## 2020-06-29 DIAGNOSIS — R7303 Prediabetes: Secondary | ICD-10-CM | POA: Diagnosis not present

## 2020-06-29 DIAGNOSIS — I10 Essential (primary) hypertension: Secondary | ICD-10-CM | POA: Diagnosis not present

## 2020-06-29 DIAGNOSIS — C3412 Malignant neoplasm of upper lobe, left bronchus or lung: Secondary | ICD-10-CM | POA: Diagnosis not present

## 2020-07-08 ENCOUNTER — Inpatient Hospital Stay (HOSPITAL_COMMUNITY): Payer: Medicare Other

## 2020-07-08 ENCOUNTER — Encounter (HOSPITAL_COMMUNITY): Payer: Self-pay | Admitting: Hematology

## 2020-07-08 ENCOUNTER — Inpatient Hospital Stay (HOSPITAL_BASED_OUTPATIENT_CLINIC_OR_DEPARTMENT_OTHER): Payer: Medicare Other | Admitting: Hematology

## 2020-07-08 ENCOUNTER — Other Ambulatory Visit: Payer: Self-pay

## 2020-07-08 VITALS — BP 144/82 | HR 85 | Temp 97.0°F | Resp 20 | Wt 285.5 lb

## 2020-07-08 VITALS — BP 150/74 | HR 82 | Temp 97.1°F | Resp 20

## 2020-07-08 DIAGNOSIS — Z79899 Other long term (current) drug therapy: Secondary | ICD-10-CM | POA: Diagnosis not present

## 2020-07-08 DIAGNOSIS — C3492 Malignant neoplasm of unspecified part of left bronchus or lung: Secondary | ICD-10-CM

## 2020-07-08 DIAGNOSIS — R7303 Prediabetes: Secondary | ICD-10-CM

## 2020-07-08 DIAGNOSIS — I2510 Atherosclerotic heart disease of native coronary artery without angina pectoris: Secondary | ICD-10-CM

## 2020-07-08 DIAGNOSIS — C3412 Malignant neoplasm of upper lobe, left bronchus or lung: Secondary | ICD-10-CM | POA: Diagnosis not present

## 2020-07-08 DIAGNOSIS — Z95828 Presence of other vascular implants and grafts: Secondary | ICD-10-CM

## 2020-07-08 DIAGNOSIS — M150 Primary generalized (osteo)arthritis: Secondary | ICD-10-CM

## 2020-07-08 DIAGNOSIS — Z5112 Encounter for antineoplastic immunotherapy: Secondary | ICD-10-CM | POA: Diagnosis not present

## 2020-07-08 LAB — CBC WITH DIFFERENTIAL/PLATELET
Abs Immature Granulocytes: 0.01 10*3/uL (ref 0.00–0.07)
Basophils Absolute: 0 10*3/uL (ref 0.0–0.1)
Basophils Relative: 1 %
Eosinophils Absolute: 0.1 10*3/uL (ref 0.0–0.5)
Eosinophils Relative: 3 %
HCT: 38.1 % (ref 36.0–46.0)
Hemoglobin: 11.8 g/dL — ABNORMAL LOW (ref 12.0–15.0)
Immature Granulocytes: 0 %
Lymphocytes Relative: 13 %
Lymphs Abs: 0.6 10*3/uL — ABNORMAL LOW (ref 0.7–4.0)
MCH: 27.5 pg (ref 26.0–34.0)
MCHC: 31 g/dL (ref 30.0–36.0)
MCV: 88.8 fL (ref 80.0–100.0)
Monocytes Absolute: 0.3 10*3/uL (ref 0.1–1.0)
Monocytes Relative: 8 %
Neutro Abs: 3.3 10*3/uL (ref 1.7–7.7)
Neutrophils Relative %: 75 %
Platelets: 343 10*3/uL (ref 150–400)
RBC: 4.29 MIL/uL (ref 3.87–5.11)
RDW: 15.7 % — ABNORMAL HIGH (ref 11.5–15.5)
WBC: 4.3 10*3/uL (ref 4.0–10.5)
nRBC: 0 % (ref 0.0–0.2)

## 2020-07-08 LAB — COMPREHENSIVE METABOLIC PANEL
ALT: 22 U/L (ref 0–44)
AST: 20 U/L (ref 15–41)
Albumin: 3.5 g/dL (ref 3.5–5.0)
Alkaline Phosphatase: 80 U/L (ref 38–126)
Anion gap: 6 (ref 5–15)
BUN: 10 mg/dL (ref 8–23)
CO2: 26 mmol/L (ref 22–32)
Calcium: 8.8 mg/dL — ABNORMAL LOW (ref 8.9–10.3)
Chloride: 108 mmol/L (ref 98–111)
Creatinine, Ser: 0.61 mg/dL (ref 0.44–1.00)
GFR, Estimated: >60 mL/min (ref >60)
Glucose, Bld: 107 mg/dL — ABNORMAL HIGH (ref 70–99)
Potassium: 3.8 mmol/L (ref 3.5–5.1)
Sodium: 140 mmol/L (ref 135–145)
Total Bilirubin: 0.6 mg/dL (ref 0.3–1.2)
Total Protein: 6.4 g/dL — ABNORMAL LOW (ref 6.5–8.1)

## 2020-07-08 LAB — LIPID PANEL
Cholesterol: 148 mg/dL (ref 0–200)
HDL: 61 mg/dL (ref >40)
LDL Cholesterol: 67 mg/dL (ref 0–99)
Total CHOL/HDL Ratio: 2.4 RATIO
Triglycerides: 100 mg/dL (ref <150)
VLDL: 20 mg/dL (ref 0–40)

## 2020-07-08 LAB — HEMOGLOBIN A1C
Hgb A1c MFr Bld: 6.5 % — ABNORMAL HIGH (ref 4.8–5.6)
Mean Plasma Glucose: 139.85 mg/dL

## 2020-07-08 LAB — TSH: TSH: 1.036 u[IU]/mL (ref 0.350–4.500)

## 2020-07-08 MED ORDER — SODIUM CHLORIDE 0.9 % IV SOLN
10.0000 mg/kg | Freq: Once | INTRAVENOUS | Status: AC
  Administered 2020-07-08: 1240 mg via INTRAVENOUS
  Filled 2020-07-08: qty 20

## 2020-07-08 MED ORDER — SODIUM CHLORIDE 0.9% FLUSH
10.0000 mL | INTRAVENOUS | Status: DC | PRN
  Administered 2020-07-08: 10 mL

## 2020-07-08 MED ORDER — HEPARIN SOD (PORK) LOCK FLUSH 100 UNIT/ML IV SOLN
500.0000 [IU] | Freq: Once | INTRAVENOUS | Status: AC | PRN
  Administered 2020-07-08: 500 [IU]

## 2020-07-08 MED ORDER — SODIUM CHLORIDE 0.9 % IV SOLN
Freq: Once | INTRAVENOUS | Status: AC
Start: 2020-07-08 — End: 2020-07-08

## 2020-07-08 NOTE — Progress Notes (Signed)
Denise [name], [place] [address]   CLINIC:  Medical Oncology/Hematology  PCP:  Iona Beard, [address] / [place] [address] [phone]   REASON FOR VISIT:  Follow-up for stage III left lung adenocarcinoma  PRIOR THERAPY:  1. Chemoradiation with carboplatin and paclitaxel from 01/07/2019 to 02/11/2019. 2. Consolidation with durvalumab from 03/19/2019 to 04/16/2019, held due to pneumonitis.  NGS Results: Not done  CURRENT THERAPY: Consolidation with durvalumab every 2 weeks; brain mets SRS in 5 fractions  BRIEF ONCOLOGIC HISTORY:  Oncology History  Adenocarcinoma of left lung ([place])  12/09/2018 Initial Diagnosis   Adenocarcinoma of left lung ([place])   01/02/2019 Cancer Staging   Staging form: Lung, AJCC 8th Edition - Clinical stage from 01/02/2019: Stage IIIB (cT3, cN2, cM0) - Signed by Derek Jack, MD on 01/02/2019   01/07/2019 - 02/11/2019 Chemotherapy   The patient had palonosetron (ALOXI) injection 0.25 mg, 0.25 mg, Intravenous,  Once, 6 of 6 cycles Administration: 0.25 mg (01/07/2019), 0.25 mg (01/14/2019), 0.25 mg (01/21/2019), 0.25 mg (01/28/2019), 0.25 mg (02/04/2019), 0.25 mg (02/11/2019) CARBOplatin (PARAPLATIN) 270 mg in sodium chloride 0.9 % 250 mL chemo infusion, 270 mg (100 % of original dose 266.4 mg), Intravenous,  Once, 6 of 6 cycles Dose modification:   (original dose 266.4 mg, Cycle 1),   (original dose 266.4 mg, Cycle 2),   (original dose 266.4 mg, Cycle 3),   (original dose 266.4 mg, Cycle 4) Administration: 270 mg (01/07/2019), 270 mg (01/14/2019), 270 mg (01/21/2019), 270 mg (01/28/2019), 270 mg (02/04/2019), 270 mg (02/11/2019) PACLitaxel (TAXOL) 108 mg in sodium chloride 0.9 % 250 mL chemo infusion (</= 80mg /m2), 45 mg/m2 = 108 mg, Intravenous,  Once, 6 of 6 cycles Administration: 108 mg (01/07/2019), 108 mg (01/14/2019), 108 mg (01/21/2019), 108 mg (01/28/2019), 108 mg (02/04/2019), 108 mg  (02/11/2019) fosaprepitant (EMEND) 150 mg, dexamethasone (DECADRON) 12 mg in sodium chloride 0.9 % 145 mL IVPB, , Intravenous,  Once, 5 of 5 cycles Administration:  (01/14/2019),  (01/21/2019),  (01/28/2019),  (02/04/2019),  (02/11/2019)  for chemotherapy treatment.    03/19/2019 -  Chemotherapy    Patient is on Treatment Plan: LUNG DURVALUMAB Q14D        CANCER STAGING: Cancer Staging Adenocarcinoma of left lung [facility]) Staging form: Lung, AJCC 8th Edition - Clinical stage from 01/02/2019: Stage IIIB (cT3, cN2, cM0) - Signed by Derek Jack, MD on 01/02/2019   INTERVAL HISTORY:  [name], a 70 y.o. female, returns for routine follow-up and consideration for next cycle of immunotherapy. Denise [name] was last seen on 06/10/2020.  Due for cycle #29 of durvalumab today.   Overall, she tells me she has been feeling pretty well. She reports that her SOB is stable and denies having any new cough. She tolerated the previous treatment well and denies having diarrhea, skin rash or dry cough, though she reports having dry skin which is sometimes itchy, so she applies a lotion multiple times a day.  Overall, she feels ready for next cycle of immunotherapy today.    REVIEW OF SYSTEMS:  Review of Systems  Constitutional: Positive for fatigue (75%). Negative for appetite change.  Respiratory: Positive for shortness of breath (stable). Negative for cough.   Gastrointestinal: Negative for diarrhea.  Skin: Positive for itching (d/t dry skin). Negative for rash.       Dry skin  All other systems reviewed and are negative.   PAST MEDICAL/SURGICAL HISTORY:  Past Medical History:  Diagnosis Date  . Anemia   . Aortic stenosis   . Arthritis   . Cancer ([place])    Lung  . Coronary artery calcification seen on CT scan   . Essential hypertension   . GERD (gastroesophageal reflux disease)   . History of lung cancer    Stage III adenocarcinoma status post chemoradiation  . Port-A-Cath in  place 01/06/2019  . Type 2 diabetes mellitus ([place])    Past Surgical History:  Procedure Laterality Date  . CHOLECYSTECTOMY  1997  . COLONOSCOPY    . GASTRIC BYPASS    . INCISIONAL HERNIA REPAIR  04/11/11  . IR IMAGING GUIDED PORT INSERTION  12/27/2018   Right  . LAPAROSCOPIC SALPINGOOPHERECTOMY    . LAPAROTOMY  04/11/2011   Procedure: EXPLORATORY LAPAROTOMY;  Surgeon: Joyice Faster. Cornett, MD;  Location: [facility];  Service: General;  Laterality: N/A;  closure port hole  . RIGHT/LEFT HEART CATH AND CORONARY ANGIOGRAPHY N/A 12/29/2019   Procedure: RIGHT/LEFT HEART CATH AND CORONARY ANGIOGRAPHY;  Surgeon: Troy Sine, MD;  Location: [facility];  Service: Cardiovascular;  Laterality: N/A;  . TOTAL HIP ARTHROPLASTY  03/07/2012   Procedure: TOTAL HIP ARTHROPLASTY ANTERIOR APPROACH;  Surgeon: Mauri Pole, MD;  Location: [facility];  Service: Orthopedics;  Laterality: Right;  . TOTAL SHOULDER ARTHROPLASTY Left 01/21/2015  . TOTAL SHOULDER ARTHROPLASTY Left 01/21/2015   Procedure: LEFT TOTAL SHOULDER ARTHROPLASTY;  Surgeon: Justice Britain, MD;  Location: [facility];  Service: Orthopedics;  Laterality: Left;  Marland Kitchen VAGINAL HYSTERECTOMY      SOCIAL HISTORY:  Social History   Socioeconomic History  . Marital status: Single    Spouse name: Not on file  . Number of children: Not on file  . Years of education: 12th grade  . Highest education level: Not on file  Occupational History  . Occupation: Employed    Employer: [employer]  Tobacco Use  . Smoking status: Former Smoker    Packs/day: 1.00    Years: 12.00    Pack years: 12.00    Types: Cigarettes    Quit date: 03/20/1976    Years since quitting: 44.3  . Smokeless tobacco: Never Used  Vaping Use  . Vaping Use: Never used  Substance and Sexual Activity  . Alcohol use: No  . Drug use: No  . Sexual activity: Not Currently  Other Topics Concern  . Not on file  Social History Narrative  . Not on file   Social Determinants of  Health   Financial Resource Strain: Not on file  Food Insecurity: Not on file  Transportation Needs: Not on file  Physical Activity: Not on file  Stress: Not on file  Social Connections: Not on file  Intimate Partner Violence: Not on file    FAMILY HISTORY:  Family History  Problem Relation Age of Onset  . Breast cancer Mother   . COPD Mother   . Arthritis Mother   . Diabetes Mother   . Hypertension Mother   . Hypertension Father   . Diabetes Father   . Breast cancer Sister   . Thyroid cancer Brother   . Huntington's disease Maternal Grandmother   . Heart attack Brother     CURRENT MEDICATIONS:  Current Outpatient Medications  Medication Sig Dispense Refill  . Acetaminophen (TYLENOL 8 HOUR ARTHRITIS PAIN PO) Take 650 ng by mouth 2 (two) times daily.     Marland Kitchen albuterol (VENTOLIN HFA) 108 (90 Base) MCG/ACT inhaler Inhale into the lungs.    Marland Kitchen  amLODipine (NORVASC) 5 MG tablet Take 5 mg by mouth daily.     Marland Kitchen aspirin EC 81 MG tablet Take 81 mg by mouth daily. Swallow whole.    . Calcium Carbonate Antacid (TUMS PO) Take 4 tablets by mouth daily.    . carboxymethylcellul-glycerin (OPTIVE) 0.5-0.9 % ophthalmic solution Place 1-2 drops into both eyes 3 (three) times daily as needed (dry/irritated eyes.).    Marland Kitchen carvedilol (COREG) 3.125 MG tablet TAKE 1 TABLET BY MOUTH 2 TIMES DAILY. 180 tablet 2  . Cyanocobalamin (VITAMIN B-12) 2500 MCG SUBL Place 2,500 mcg under the tongue every morning.     . diphenhydrAMINE (BENADRYL) 25 MG tablet Take 25 mg by mouth every 6 (six) hours as needed for itching or allergies.    Hunt Oris IV Inject into the vein every 14 (fourteen) days.    . ferrous sulfate 325 (65 FE) MG tablet Take 325 mg by mouth every evening.    . furosemide (LASIX) 20 MG tablet TAKE 1 TABLET BY MOUTH AS NEEDED FOR FLUID OR EDEMA (DAILY AS NEEDED FOR SWELLING). 90 tablet 1  . gabapentin (NEURONTIN) 300 MG capsule Take by mouth.    Marland Kitchen HYDROcodone-acetaminophen (NORCO/VICODIN) 5-325  MG tablet Take 1 tablet by mouth 2 (two) times daily as needed.    . lidocaine-prilocaine (EMLA) cream APPLY SMALL AMOUNT TO PORT A CATH SITE AND COVER WITH PLASTIC WRAP 1 HR PRIOR TO CHEMOTHERAPY APPTS 30 g 3  . Multiple Vitamin (MULITIVITAMIN WITH MINERALS) TABS Take 1 tablet by mouth daily.    . naproxen (NAPROSYN) 500 MG tablet Take 500 mg by mouth every 12 (twelve) hours.     Marland Kitchen omeprazole (PRILOSEC) 20 MG capsule Take 20 mg by mouth daily.     Current Facility-Administered Medications  Medication Dose Route Frequency Provider Last Rate Last Admin  . sodium chloride flush (NS) 0.9 % injection 3 mL  3 mL Intravenous Q12H Satira Sark, MD        ALLERGIES:  No Known Allergies  PHYSICAL EXAM:  Performance status (ECOG): 1 - Symptomatic but completely ambulatory  Vitals:   07/08/20 0937  BP: (!) 144/82  Pulse: 85  Resp: 20  Temp: (!) 97 F (36.1 C)  SpO2: 99%   Wt Readings from Last 3 Encounters:  07/08/20 285 lb 7.9 oz (129.5 kg)  06/10/20 281 lb 3.2 oz (127.6 kg)  05/28/20 282 lb 3.2 oz (128 kg)   Physical Exam Vitals reviewed.  Constitutional:      Appearance: Normal appearance. She is obese.  Cardiovascular:     Rate and Rhythm: Normal rate and regular rhythm.     Pulses: Normal pulses.     Heart sounds: Murmur (systolic murmur) heard.    Pulmonary:     Effort: Pulmonary effort is normal.     Breath sounds: Normal breath sounds.  Chest:     Comments: Port-a-Cath in R chest Musculoskeletal:     Right lower leg: Edema (1+) present.     Left lower leg: Edema (1+) present.  Neurological:     General: No focal deficit present.     Mental Status: She is alert and oriented to person, place, and time.  Psychiatric:        Mood and Affect: Mood normal.        Behavior: Behavior normal.     LABORATORY DATA:  I have reviewed the labs as listed.  CBC Latest Ref Rng & Units 07/08/2020 06/24/2020 06/10/2020  WBC 4.0 -  10.5 K/uL 4.3 4.3 5.5  Hemoglobin 12.0 - 15.0  g/dL 11.8(L) 11.5(L) 11.8(L)  Hematocrit 36.0 - 46.0 % 38.1 37.4 38.1  Platelets 150 - 400 K/uL 343 313 304   CMP Latest Ref Rng & Units 07/08/2020 06/24/2020 06/10/2020  Glucose 70 - 99 mg/dL 107(H) 104(H) 102(H)  BUN 8 - 23 mg/dL 10 14 10   Creatinine 0.44 - 1.00 mg/dL 0.61 0.66 0.73  Sodium 135 - 145 mmol/L 140 144 140  Potassium 3.5 - 5.1 mmol/L 3.8 3.8 3.9  Chloride 98 - 111 mmol/L 108 110 108  CO2 22 - 32 mmol/L 26 25 24   Calcium 8.9 - 10.3 mg/dL 8.8(L) 9.3 8.8(L)  Total Protein 6.5 - 8.1 g/dL 6.4(L) 6.2(L) 6.3(L)  Total Bilirubin 0.3 - 1.2 mg/dL 0.6 0.5 0.4  Alkaline Phos 38 - 126 U/L 80 75 71  AST 15 - 41 U/L 20 18 17   ALT 0 - 44 U/L 22 18 16     DIAGNOSTIC IMAGING:  I have independently reviewed the scans and discussed with the patient. No results found.   ASSESSMENT:  1.Adenocarcinoma of left lung ([facility]) -Chemoradiation therapy with carboplatin and paclitaxel from 01/07/2019 through 02/11/2019. -Consolidation immunotherapy with durvalumab from 03/19/2019 through 04/15/2018, held due to pneumonitis. -CT chest on 07/02/2019 shows left upper lobe lung mass measuring 2.6 x 2.0 cm. It shows improvement in size. -MRI of the brain on 07/18/2019 showed left sphenoid wing meningioma measuring 2.6 x 2.0 x 2.6 cm unchanged. No new enhancing intracranial lesion. Increased left temporal white matter edema. -Durvalumab restarted on 07/24/2019. -We reviewed CT chest with contrast from 09/29/2019 which showed stable appearance of treated lung lesion within the posterior left upper lobe. New bandlike area of increased soft tissue density within the anterior to the treated lung lesion favored to be progressive changes from radiation. Local tumor recurrence is less favored. Stable pericardial effusion. -CT chest on 12/10/2019 shows posttreatment appearance of the left lung with associated scarring and volume loss. No evidence of recurrence or metastatic disease. Unchanged small pericardial  effusion. -She was evaluated by cardiology with a stress test. EF was 46%. She underwent cardiac catheterization which did not show any abnormalities.   PLAN:  1. Clinical stage IIIb (T3N2) adenocarcinoma of the lung: -Shortness of breath on exertion is stable.  Denies any other immunotherapy related side effects. - CT chest on 06/07/2020 showed left upper lobe pulmonary nodule minimally smaller compared to prior scan with no new findings. - Reviewed labs today which showed normal TSH and hepatic function panel. - She will proceed with treatment today and in 2 weeks.  This concludes 1 year of Imfinzi. - RTC 8 weeks with CT of the chest with contrast and repeat labs.  2. Shortness of breath on exertion: -Continue follow-up with pulmonary.  Continue inhalers.  3.Meningioma/brain mets: -Recent brain MRI showed 2 3 mm lesions for which she completed SRS treatments to the brain.  4. Hypertension: -Continue Norvasc daily.  Systolic blood pressure is 144 today.  5. Bilateral leg swelling: -Continue Lasix as needed.  6. Bilateral knee pains, right more than left: -She has pain from bilateral osteoarthritis.  Continue follow-up with Dr. Para March.   Orders placed this encounter:  No orders of the defined types were placed in this encounter.    Derek Jack, MD [place] [phone]   I, Milinda Antis, am acting as a scribe for Dr. Sanda Linger.  I, Derek Jack MD, have reviewed the above documentation for accuracy and  completeness, and I agree with the above.

## 2020-07-08 NOTE — Patient Instructions (Signed)
[place] at [facility] Discharge Instructions  You were seen today by Dr. Delton Coombes. He went over your recent results. You received your treatment today; your last treatment will be in 2 weeks. You will be scheduled to have a CT scan of your chest done before your next visit. Dr. Delton Coombes will see you back in 2 months for labs and follow up.   Thank you for choosing [place] at [facility] to provide your oncology and hematology care.  To afford each patient quality time with our provider, please arrive at least 15 minutes before your scheduled appointment time.   If you have a lab appointment with the [place] [facility] please come in thru the Main Entrance and check in at the main information desk  You need to re-schedule your appointment should you arrive 10 or more minutes late.  We strive to give you quality time with our providers, and arriving late affects you and other patients whose appointments are after yours.  Also, if you no show three or more times for appointments you may be dismissed from the clinic at the providers discretion.     Again, thank you for choosing [facility].  Our hope is that these requests will decrease the amount of time that you wait before being seen by our physicians.       _____________________________________________________________  Should you have questions after your visit to [facility], please contact our office at [phone] between the hours of 8:00 a.m. and 4:30 p.m.  Voicemails left after 4:00 p.m. will not be returned until the following business day.  For prescription refill requests, have your pharmacy contact our office and allow 72 hours.    Cancer Center Support Programs:   > Cancer Support Group  2nd Tuesday of the month 1pm-2pm, Journey Room

## 2020-07-08 NOTE — Progress Notes (Signed)
Patient was assessed by Dr. Katragadda and labs have been reviewed.  Patient is okay to proceed with treatment today. Primary RN and pharmacy aware.   

## 2020-07-08 NOTE — Progress Notes (Signed)
Patient tolerated therapy with no complaints voiced.  Side effects with management reviewed with understanding verbalized.  Port site clean and dry with no bruising or swelling noted at site.  Good blood return noted before and after administration of therapy.  Band aid applied.  Patient left in satisfactory condition with VSS and no s/s of distress noted.

## 2020-07-08 NOTE — Patient Instructions (Signed)
[place] at [facility]  Discharge Instructions:   Implanted [place] An implanted port is a device that is placed under the skin. It is usually placed in the chest. The device can be used to give IV medicine, to take blood, or for dialysis. You may have an implanted port if:  You need IV medicine that would be irritating to the small veins in your hands or arms.  You need IV medicines, such as antibiotics, for a long period of time.  You need IV nutrition for a long period of time.  You need dialysis. When you have a port, your health care provider can choose to use the port instead of veins in your arms for these procedures. You may have fewer limitations when using a port than you would if you used other types of long-term IVs, and you will likely be able to return to normal activities after your incision heals. An implanted port has two main parts:  Reservoir. The reservoir is the part where a needle is inserted to give medicines or draw blood. The reservoir is round. After it is placed, it appears as a small, raised area under your skin.  Catheter. The catheter is a thin, flexible tube that connects the reservoir to a vein. Medicine that is inserted into the reservoir goes into the catheter and then into the vein. How is my port accessed? To access your port:  A numbing cream may be placed on the skin over the port site.  Your health care provider will put on a mask and sterile gloves.  The skin over your port will be cleaned carefully with a germ-killing soap and allowed to dry.  Your health care provider will gently pinch the port and insert a needle into it.  Your health care provider will check for a blood return to make sure the port is in the vein and is not clogged.  If your port needs to remain accessed to get medicine continuously (constant infusion), your health care provider will place a clear bandage (dressing) over the needle site.  The dressing and needle will need to be changed every week, or as told by your health care provider. What is flushing? Flushing helps keep the port from getting clogged. Follow instructions from your health care provider about how and when to flush the port. Ports are usually flushed with saline solution or a medicine called heparin. The need for flushing will depend on how the port is used:  If the port is only used from time to time to give medicines or draw blood, the port may need to be flushed: ? Before and after medicines have been given. ? Before and after blood has been drawn. ? As part of routine maintenance. Flushing may be recommended every 4-6 weeks.  If a constant infusion is running, the port may not need to be flushed.  Throw away any syringes in a disposal container that is meant for sharp items (sharps container). You can buy a sharps container from a pharmacy, or you can make one by using an empty hard plastic bottle with a cover. How long will my port stay implanted? The port can stay in for as long as your health care provider thinks it is needed. When it is time for the port to come out, a surgery will be done to remove it. The surgery will be similar to the procedure that was done to put the port in. Follow these  instructions at home:  Flush your port as told by your health care provider.  If you need an infusion over several days, follow instructions from your health care provider about how to take care of your port site. Make sure you: ? Wash your hands with soap and water before you change your dressing. If soap and water are not available, use alcohol-based hand sanitizer. ? Change your dressing as told by your health care provider. ? Place any used dressings or infusion bags into a plastic bag. Throw that bag in the trash. ? Keep the dressing that covers the needle clean and dry. Do not get it wet. ? Do not use scissors or sharp objects near the tube. ? Keep the  tube clamped, unless it is being used.  Check your port site every day for signs of infection. Check for: ? Redness, swelling, or pain. ? Fluid or blood. ? Pus or a bad smell.  Protect the skin around the port site. ? Avoid wearing bra straps that rub or irritate the site. ? Protect the skin around your port from seat belts. Place a soft pad over your chest if needed.  Bathe or shower as told by your health care provider. The site may get wet as long as you are not actively receiving an infusion.  Return to your normal activities as told by your health care provider. Ask your health care provider what activities are safe for you.  Carry a medical alert card or wear a medical alert bracelet at all times. This will let health care providers know that you have an implanted port in case of an emergency.   Get help right away if:  You have redness, swelling, or pain at the port site.  You have fluid or blood coming from your port site.  You have pus or a bad smell coming from the port site.  You have a fever. Summary  Implanted ports are usually placed in the chest for long-term IV access.  Follow instructions from your health care provider about flushing the port and changing bandages (dressings).  Take care of the area around your port by avoiding clothing that puts pressure on the area, and by watching for signs of infection.  Protect the skin around your port from seat belts. Place a soft pad over your chest if needed.  Get help right away if you have a fever or you have redness, swelling, pain, drainage, or a bad smell at the port site. This information is not intended to replace advice given to you by your health care provider. Make sure you discuss any questions you have with your health care provider. Document Revised: 07/21/2019 Document Reviewed: 07/21/2019 Elsevier Patient Education  2021 [place].  _______________________________________________________________  Thank you for choosing [place] at [facility] to provide your oncology and hematology care.  To afford each patient quality time with our providers, please arrive at least 15 minutes before your scheduled appointment.  You need to re-schedule your appointment if you arrive 10 or more minutes late.  We strive to give you quality time with our providers, and arriving late affects you and other patients whose appointments are after yours.  Also, if you no show three or more times for appointments you may be dismissed from the clinic.  Again, thank you for choosing [place] at [place] hope is that these requests will allow you access to exceptional care and in  a timely manner. _______________________________________________________________  If you have questions after your visit, please contact our office at [phone] between the hours of 8:30 a.m. and 5:00 p.m. Voicemails left after 4:30 p.m. will not be returned until the following business day. _______________________________________________________________  For prescription refill requests, have your pharmacy contact our office. _______________________________________________________________  Recommendations made by the consultant and any test results will be sent to your referring physician. _______________________________________________________________

## 2020-07-08 NOTE — Patient Instructions (Signed)
[place] Discharge Instructions for Patients Receiving Chemotherapy  Today you received the following chemotherapy agents Infinzi.   To help prevent nausea and vomiting after your treatment, we encourage you to take your nausea medication as directed.    If you develop nausea and vomiting that is not controlled by your nausea medication, call the clinic.   BELOW ARE SYMPTOMS THAT SHOULD BE REPORTED IMMEDIATELY:  *FEVER GREATER THAN 100.5 F  *CHILLS WITH OR WITHOUT FEVER  NAUSEA AND VOMITING THAT IS NOT CONTROLLED WITH YOUR NAUSEA MEDICATION  *UNUSUAL SHORTNESS OF BREATH  *UNUSUAL BRUISING OR BLEEDING  TENDERNESS IN MOUTH AND THROAT WITH OR WITHOUT PRESENCE OF ULCERS  *URINARY PROBLEMS  *BOWEL PROBLEMS  UNUSUAL RASH Items with * indicate a potential emergency and should be followed up as soon as possible.  Feel free to call the clinic should you have any questions or concerns. The clinic phone number is [phone].  Please show the [place] at check-in to the Emergency Department and triage nurse.

## 2020-07-16 ENCOUNTER — Encounter: Payer: Self-pay | Admitting: Radiation Oncology

## 2020-07-16 ENCOUNTER — Ambulatory Visit
Admission: RE | Admit: 2020-07-16 | Discharge: 2020-07-16 | Disposition: A | Discharge status: Home / Self Care | Payer: Medicare Other | Source: Ambulatory Visit | Attending: Radiation Oncology | Admitting: Radiation Oncology

## 2020-07-16 DIAGNOSIS — D320 Benign neoplasm of cerebral meninges: Secondary | ICD-10-CM

## 2020-07-16 DIAGNOSIS — C7931 Secondary malignant neoplasm of brain: Secondary | ICD-10-CM

## 2020-07-16 NOTE — Progress Notes (Signed)
Radiation Oncology         [phone]) [phone] ________________________________  Name: Denise [name] MRN: [id number]  Date: 07/16/2020  DOB: [birth year]  Follow-Up Visit Note by telephone.  The patient opted for telemedicine to maximize safety during the pandemic.  MyChart video was not obtainable.  Outpatient  CC: Denise Beard, MD  Denise Beard, MD  Diagnosis and Prior Radiotherapy:    ICD-10-CM   1. Meningioma, cerebral ([place])  D32.0   2. Brain metastases ([place])  C79.31     She received brain radiosurgery in March 2022 Left frontal 5 mm metastasis and left frontal 6 mm metastasis received 20 Gray /1 fraction Left Sphenoid wing meningioma -  25 Gray in 5 fractions.     CHIEF COMPLAINT: Here for follow-up and surveillance of brain metastases and meningioma (left sphenoid)   Narrative:  The patient returns today for routine follow-up.  She is doing well.  Occasional HA behind left eye.  Has only happened 2-3 times since completing SRS.  They are mild and short lived.   No other new neurologic issues.               ALLERGIES:  has No Known Allergies.  Meds: Current Outpatient Medications  Medication Sig Dispense Refill  . Acetaminophen (TYLENOL 8 HOUR ARTHRITIS PAIN PO) Take 650 ng by mouth 2 (two) times daily.     Marland Kitchen albuterol (VENTOLIN HFA) 108 (90 Base) MCG/ACT inhaler Inhale into the lungs.    Marland Kitchen amLODipine (NORVASC) 5 MG tablet Take 5 mg by mouth daily.     Marland Kitchen aspirin EC 81 MG tablet Take 81 mg by mouth daily. Swallow whole.    . Calcium Carbonate Antacid (TUMS PO) Take 4 tablets by mouth daily.    . carboxymethylcellul-glycerin (OPTIVE) 0.5-0.9 % ophthalmic solution Place 1-2 drops into both eyes 3 (three) times daily as needed (dry/irritated eyes.).    Marland Kitchen carvedilol (COREG) 3.125 MG tablet TAKE 1 TABLET BY MOUTH 2 TIMES DAILY. 180 tablet 2  . Cyanocobalamin (VITAMIN B-12) 2500 MCG SUBL Place 2,500 mcg under the tongue every morning.     . diphenhydrAMINE (BENADRYL) 25 MG tablet Take 25  mg by mouth every 6 (six) hours as needed for itching or allergies.    Hunt Oris IV Inject into the vein every 14 (fourteen) days.    . ferrous sulfate 325 (65 FE) MG tablet Take 325 mg by mouth every evening.    . furosemide (LASIX) 20 MG tablet TAKE 1 TABLET BY MOUTH AS NEEDED FOR FLUID OR EDEMA (DAILY AS NEEDED FOR SWELLING). 90 tablet 1  . gabapentin (NEURONTIN) 300 MG capsule Take by mouth.    Marland Kitchen HYDROcodone-acetaminophen (NORCO/VICODIN) 5-325 MG tablet Take 1 tablet by mouth 2 (two) times daily as needed.    . lidocaine-prilocaine (EMLA) cream APPLY SMALL AMOUNT TO PORT A CATH SITE AND COVER WITH PLASTIC WRAP 1 HR PRIOR TO CHEMOTHERAPY APPTS 30 g 3  . Multiple Vitamin (MULITIVITAMIN WITH MINERALS) TABS Take 1 tablet by mouth daily.    . naproxen (NAPROSYN) 500 MG tablet Take 500 mg by mouth every 12 (twelve) hours.     Marland Kitchen omeprazole (PRILOSEC) 20 MG capsule Take 20 mg by mouth daily.     Current Facility-Administered Medications  Medication Dose Route Frequency Provider Last Rate Last Admin  . sodium chloride flush (NS) 0.9 % injection 3 mL  3 mL Intravenous Q12H Satira Sark, MD        Physical Findings: The  patient is in no acute distress. Patient is alert and oriented.  vitals were not taken for this visit. .        Lab Findings: Lab Results  Component Value Date   WBC 4.3 07/08/2020   HGB 11.8 (L) 07/08/2020   HCT 38.1 07/08/2020   MCV 88.8 07/08/2020   PLT 343 07/08/2020    Radiographic Findings: No results found.  Impression/Plan: She is doing well clinically after radiosurgery to the brain.  Plan for MRI of brain in 2 months and follow-up with neurosurgery at that time.  I will see her for follow-ups alternating with neurosurgery after her serial MRIs.  She is pleased with this plan and knows to call if she has any questions or concerns in the interim.   This encounter was provided by telemedicine platform; patient desired telemedicine during pandemic  precautions.  Telephone was used.  MyChart video was not available. The patient has given verbal consent for this type of encounter and has been advised to only accept a meeting of this type in a secure network environment. On date of service, in total, I spent 15 minutes on this encounter.   The attendants for this meeting include Eppie Gibson  and [name] During the encounter, Eppie Gibson was located at [facility] Radiation Oncology Department.  Marrion Finan was located at home.    Eppie Gibson, MD

## 2020-07-21 ENCOUNTER — Ambulatory Visit (INDEPENDENT_AMBULATORY_CARE_PROVIDER_SITE_OTHER): Payer: Medicare Other | Admitting: Cardiology

## 2020-07-21 ENCOUNTER — Encounter: Payer: Self-pay | Admitting: Cardiology

## 2020-07-21 VITALS — BP 132/76 | HR 83 | Ht 62.0 in | Wt 284.4 lb

## 2020-07-21 DIAGNOSIS — I2510 Atherosclerotic heart disease of native coronary artery without angina pectoris: Secondary | ICD-10-CM | POA: Diagnosis not present

## 2020-07-21 DIAGNOSIS — I10 Essential (primary) hypertension: Secondary | ICD-10-CM

## 2020-07-21 DIAGNOSIS — I350 Nonrheumatic aortic (valve) stenosis: Secondary | ICD-10-CM | POA: Diagnosis not present

## 2020-07-21 NOTE — Patient Instructions (Addendum)
Medication Instructions:   Your physician recommends that you continue on your current medications as directed. Please refer to the Current Medication list given to you today.  Labwork:  none  Testing/Procedures:  Please reschedule your echocardiogram  Follow-Up:  Your physician recommends that you schedule a follow-up appointment in: 6 months.  Any Other Special Instructions Will Be Listed Below (If Applicable).  If you need a refill on your cardiac medications before your next appointment, please call your pharmacy.

## 2020-07-21 NOTE — Progress Notes (Signed)
Cardiology Office Note  Date: 07/21/2020   ID: Denise [name], DOB September 27, 1950, MRN [id number]  PCP:  Denise Beard, MD  Cardiologist:  Rozann Lesches, MD Electrophysiologist:  None   Chief Complaint  Patient presents with  . Cardiac follow-up    History of Present Illness: Denise [name] is a 70 y.o. female last seen in November 2021 by Mr. Denise [name].  She presents for a routine visit.  Reports no progressive shortness of breath or angina symptoms.  She continues to follow with Dr. Delton [name], undergoing chemotherapy for stage III left lung adenocarcinoma.  She is due for a follow-up echocardiogram for reassessment of aortic stenosis.  We discussed this today.  Cardiac testing from last year is outlined below.  We went over her medications.  Also recent lab work is reviewed below.  Past Medical History:  Diagnosis Date  . Anemia   . Aortic stenosis   . Arthritis   . Cancer ([place])    Lung  . Coronary artery calcification seen on CT scan   . Essential hypertension   . GERD (gastroesophageal reflux disease)   . History of lung cancer    Stage III adenocarcinoma status post chemoradiation  . Port-A-Cath in place 01/06/2019  . Type 2 diabetes mellitus ([place])     Past Surgical History:  Procedure Laterality Date  . CHOLECYSTECTOMY  1997  . COLONOSCOPY    . GASTRIC BYPASS    . INCISIONAL HERNIA REPAIR  04/11/11  . IR IMAGING GUIDED PORT INSERTION  12/27/2018   Right  . LAPAROSCOPIC SALPINGOOPHERECTOMY    . LAPAROTOMY  04/11/2011   Procedure: EXPLORATORY LAPAROTOMY;  Surgeon: Joyice Faster. Cornett, MD;  Location: [facility] OR[facility];  Service: General;  Laterality: N/A;  closure port hole  . RIGHT/LEFT HEART CATH AND CORONARY ANGIOGRAPHY N/A 12/29/2019   Procedure: RIGHT/LEFT HEART CATH AND CORONARY ANGIOGRAPHY;  Surgeon: Troy Sine, MD;  Location: [facility] CV LAB;  Service: Cardiovascular;  Laterality: N/A;  . TOTAL HIP ARTHROPLASTY  03/07/2012   Procedure: TOTAL HIP ARTHROPLASTY ANTERIOR  APPROACH;  Surgeon: Mauri Pole, MD;  Location: [facility] OR[facility];  Service: Orthopedics;  Laterality: Right;  . TOTAL SHOULDER ARTHROPLASTY Left 01/21/2015  . TOTAL SHOULDER ARTHROPLASTY Left 01/21/2015   Procedure: LEFT TOTAL SHOULDER ARTHROPLASTY;  Surgeon: Justice Britain, MD;  Location: [place];  Service: Orthopedics;  Laterality: Left;  Marland Kitchen VAGINAL HYSTERECTOMY      Current Outpatient Medications  Medication Sig Dispense Refill  . Acetaminophen (TYLENOL 8 HOUR ARTHRITIS PAIN PO) Take 650 ng by mouth 2 (two) times daily.     Marland Kitchen albuterol (VENTOLIN HFA) 108 (90 Base) MCG/ACT inhaler Inhale into the lungs.    Marland Kitchen amLODipine (NORVASC) 5 MG tablet Take 5 mg by mouth daily.     Marland Kitchen aspirin EC 81 MG tablet Take 81 mg by mouth daily. Swallow whole.    . Calcium Carbonate Antacid (TUMS PO) Take 4 tablets by mouth daily.    . carboxymethylcellul-glycerin (OPTIVE) 0.5-0.9 % ophthalmic solution Place 1-2 drops into both eyes 3 (three) times daily as needed (dry/irritated eyes.).    Marland Kitchen carvedilol (COREG) 3.125 MG tablet TAKE 1 TABLET BY MOUTH 2 TIMES DAILY. 180 tablet 2  . Cyanocobalamin (VITAMIN B-12) 2500 MCG SUBL Place 2,500 mcg under the tongue every morning.     . diphenhydrAMINE (BENADRYL) 25 MG tablet Take 25 mg by mouth every 6 (six) hours as needed for itching or allergies.    Hunt Oris IV Inject into the  vein every 14 (fourteen) days.    . ferrous sulfate 325 (65 FE) MG tablet Take 325 mg by mouth every evening.    . furosemide (LASIX) 20 MG tablet TAKE 1 TABLET BY MOUTH AS NEEDED FOR FLUID OR EDEMA (DAILY AS NEEDED FOR SWELLING). 90 tablet 1  . gabapentin (NEURONTIN) 300 MG capsule Take by mouth.    Marland Kitchen HYDROcodone-acetaminophen (NORCO/VICODIN) 5-325 MG tablet Take 1 tablet by mouth 2 (two) times daily as needed.    . lidocaine-prilocaine (EMLA) cream APPLY SMALL AMOUNT TO PORT A CATH SITE AND COVER WITH PLASTIC WRAP 1 HR PRIOR TO CHEMOTHERAPY APPTS 30 g 3  . Multiple Vitamin (MULITIVITAMIN WITH MINERALS)  TABS Take 1 tablet by mouth daily.    . naproxen (NAPROSYN) 500 MG tablet Take 500 mg by mouth every 12 (twelve) hours.     Marland Kitchen omeprazole (PRILOSEC) 20 MG capsule Take 20 mg by mouth daily.     Current Facility-Administered Medications  Medication Dose Route Frequency Provider Last Rate Last Admin  . sodium chloride flush (NS) 0.9 % injection 3 mL  3 mL Intravenous Q12H Satira Sark, MD       Allergies:  Patient has no known allergies.   ROS: No palpitations or syncope.  Physical Exam: VS:  BP 132/76   Pulse 83   Ht 5\' 2"  (1.575 m)   Wt 284 lb 6.4 oz (129 kg)   SpO2 97%   BMI 52.02 kg/m , BMI Body mass index is 52.02 kg/m.  Wt Readings from Last 3 Encounters:  07/21/20 284 lb 6.4 oz (129 kg)  07/08/20 285 lb 7.9 oz (129.5 kg)  06/10/20 281 lb 3.2 oz (127.6 kg)    General: Patient appears comfortable at rest. HEENT: Conjunctiva and lids normal, wearing a mask. Neck: Supple, no elevated JVP or carotid bruits, no thyromegaly. Lungs: Clear to auscultation, nonlabored breathing at rest. Cardiac: Regular rate and rhythm, no S3, 3/6 systolic murmur, no pericardial rub. Extremities: No pitting edema.  ECG:  An ECG dated 12/04/2019 was personally reviewed today and demonstrated:  Sinus rhythm with right bundle branch block and left anterior fascicular block.  Recent Labwork: 07/08/2020: ALT 22; AST 20; BUN 10; Creatinine, Ser 0.61; Hemoglobin 11.8; Platelets 343; Potassium 3.8; Sodium 140; TSH 1.036     Component Value Date/Time   CHOL 148 07/08/2020 0951   TRIG 100 07/08/2020 0951   HDL 61 07/08/2020 0951   CHOLHDL 2.4 07/08/2020 0951   VLDL 20 07/08/2020 0951   LDLCALC 67 07/08/2020 0951    Other Studies Reviewed Today:  Echocardiogram 05/15/2019: 1. Left ventricular ejection fraction, by estimation, is 60 to 65%. The  left ventricle has normal function. The left ventricle has no regional  wall motion abnormalities. There is moderate left ventricular hypertrophy.   Left ventricular diastolic  parameters are consistent with Grade I diastolic dysfunction (impaired  relaxation). Elevated left atrial pressure.  2. Right ventricular systolic function is normal. The right ventricular  size is normal. There is normal pulmonary artery systolic pressure.  3. Left atrial size was moderately dilated.  4. The mitral valve is normal in structure and function. No evidence of  mitral valve regurgitation. No evidence of mitral stenosis.  5. The aortic valve is tricuspid. Aortic valve regurgitation is mild.  Moderate aortic valve stenosis.Aortic valve mean gradient measures 21.0  mmHg. Aortic valve peak gradient measures 36.2 mmHg. Aortic valve area, by  VTI measures 1.27 cm  6. The inferior vena cava is  normal in size with greater than 50%  respiratory variability, suggesting right atrial pressure of 3 mmHg.  7. Cannot exclude small PFO with left to right shunt.   Cardiac catheterization 12/29/2019:  The left ventricular systolic function is normal.  LV end diastolic pressure is normal.  The left ventricular ejection fraction is 55-65% by visual estimate.  There is moderate aortic valve stenosis.   Mildly elevated right heart pressures with mild pulmonary hypertension with a mean PA pressure of 33 mmHg.  Normal LV function with EF estimate approximately 60%.  Normal coronary arteries.  Moderate aortic valve stenosis with a peak to peak gradient of 20, mean gradient 18.5, and peak instantaneous gradient at 25.8 mmHg; aortic valve area 1.4 cm.  Assessment and Plan:  1.  Moderate calcific aortic stenosis by assessment last year.  She is due for a follow-up echocardiogram which will be arranged.  2.  Normal coronary arteries at cardiac catheterization in October 2021.  3.  Essential hypertension, systolic in the 580D today.  Continue current regimen including Coreg and Norvasc.  Medication Adjustments/Labs and Tests Ordered: Current  medicines are reviewed at length with the patient today.  Concerns regarding medicines are outlined above.   Tests Ordered: Orders Placed This Encounter  Procedures  . ECHOCARDIOGRAM COMPLETE    Medication Changes: No orders of the defined types were placed in this encounter.   Disposition:  Follow up 6 months.  Signed, Satira Sark, MD, [facility] 07/21/2020 4:00 PM    [facility] [place], [place], [place] [address] Phone: [phone]; Fax: [phone]

## 2020-07-23 ENCOUNTER — Inpatient Hospital Stay (HOSPITAL_COMMUNITY): Payer: Medicare Other

## 2020-07-23 ENCOUNTER — Other Ambulatory Visit: Payer: Self-pay

## 2020-07-23 ENCOUNTER — Other Ambulatory Visit (HOSPITAL_COMMUNITY): Payer: Self-pay | Admitting: Hematology

## 2020-07-23 ENCOUNTER — Inpatient Hospital Stay (HOSPITAL_COMMUNITY): Payer: Medicare Other | Attending: Hematology

## 2020-07-23 ENCOUNTER — Encounter (HOSPITAL_COMMUNITY): Payer: Self-pay

## 2020-07-23 VITALS — BP 157/72 | HR 87 | Temp 96.8°F | Resp 18

## 2020-07-23 VITALS — BP 129/71 | HR 79 | Temp 96.2°F | Resp 18

## 2020-07-23 DIAGNOSIS — Z95828 Presence of other vascular implants and grafts: Secondary | ICD-10-CM

## 2020-07-23 DIAGNOSIS — C3412 Malignant neoplasm of upper lobe, left bronchus or lung: Secondary | ICD-10-CM | POA: Diagnosis not present

## 2020-07-23 DIAGNOSIS — C3492 Malignant neoplasm of unspecified part of left bronchus or lung: Secondary | ICD-10-CM

## 2020-07-23 DIAGNOSIS — Z5112 Encounter for antineoplastic immunotherapy: Secondary | ICD-10-CM | POA: Diagnosis not present

## 2020-07-23 DIAGNOSIS — Z79899 Other long term (current) drug therapy: Secondary | ICD-10-CM | POA: Diagnosis not present

## 2020-07-23 LAB — CBC WITH DIFFERENTIAL/PLATELET
Abs Immature Granulocytes: 0.01 10*3/uL (ref 0.00–0.07)
Basophils Absolute: 0 10*3/uL (ref 0.0–0.1)
Basophils Relative: 1 %
Eosinophils Absolute: 0.1 10*3/uL (ref 0.0–0.5)
Eosinophils Relative: 2 %
HCT: 37.4 % (ref 36.0–46.0)
Hemoglobin: 11.5 g/dL — ABNORMAL LOW (ref 12.0–15.0)
Immature Granulocytes: 0 %
Lymphocytes Relative: 13 %
Lymphs Abs: 0.6 10*3/uL — ABNORMAL LOW (ref 0.7–4.0)
MCH: 27.4 pg (ref 26.0–34.0)
MCHC: 30.7 g/dL (ref 30.0–36.0)
MCV: 89 fL (ref 80.0–100.0)
Monocytes Absolute: 0.4 10*3/uL (ref 0.1–1.0)
Monocytes Relative: 9 %
Neutro Abs: 3.4 10*3/uL (ref 1.7–7.7)
Neutrophils Relative %: 75 %
Platelets: 347 10*3/uL (ref 150–400)
RBC: 4.2 MIL/uL (ref 3.87–5.11)
RDW: 15.7 % — ABNORMAL HIGH (ref 11.5–15.5)
WBC: 4.5 10*3/uL (ref 4.0–10.5)
nRBC: 0 % (ref 0.0–0.2)

## 2020-07-23 LAB — COMPREHENSIVE METABOLIC PANEL
ALT: 19 U/L (ref 0–44)
AST: 19 U/L (ref 15–41)
Albumin: 3.5 g/dL (ref 3.5–5.0)
Alkaline Phosphatase: 74 U/L (ref 38–126)
Anion gap: 6 (ref 5–15)
BUN: 9 mg/dL (ref 8–23)
CO2: 25 mmol/L (ref 22–32)
Calcium: 8.7 mg/dL — ABNORMAL LOW (ref 8.9–10.3)
Chloride: 109 mmol/L (ref 98–111)
Creatinine, Ser: 0.69 mg/dL (ref 0.44–1.00)
GFR, Estimated: >60 mL/min (ref >60)
Glucose, Bld: 107 mg/dL — ABNORMAL HIGH (ref 70–99)
Potassium: 3.8 mmol/L (ref 3.5–5.1)
Sodium: 140 mmol/L (ref 135–145)
Total Bilirubin: 0.4 mg/dL (ref 0.3–1.2)
Total Protein: 6.2 g/dL — ABNORMAL LOW (ref 6.5–8.1)

## 2020-07-23 LAB — TSH: TSH: 1.378 u[IU]/mL (ref 0.350–4.500)

## 2020-07-23 MED ORDER — SODIUM CHLORIDE 0.9% FLUSH
10.0000 mL | INTRAVENOUS | Status: DC | PRN
  Administered 2020-07-23: 10 mL

## 2020-07-23 MED ORDER — HEPARIN SOD (PORK) LOCK FLUSH 100 UNIT/ML IV SOLN
500.0000 [IU] | Freq: Once | INTRAVENOUS | Status: AC | PRN
  Administered 2020-07-23: 500 [IU]

## 2020-07-23 MED ORDER — SODIUM CHLORIDE 0.9 % IV SOLN
Freq: Once | INTRAVENOUS | Status: AC
Start: 2020-07-23 — End: 2020-07-23

## 2020-07-23 MED ORDER — DURVALUMAB 500 MG/10ML IV SOLN
10.0000 mg/kg | Freq: Once | INTRAVENOUS | Status: AC
  Administered 2020-07-23: 1240 mg via INTRAVENOUS
  Filled 2020-07-23: qty 20

## 2020-07-23 NOTE — Progress Notes (Signed)
Patient presents today for Imfinzi infusion. Vital signs within parameters for treatment. Labs pending. MAR reviewed and updated. Patient denies any changes since her last visit. No complaints today. Patient has chronic, right knee pain which she rates a 9/10.   Imfinzi given today per MD orders. Tolerated infusion without adverse affects. Vital signs stable. No complaints at this time. Discharged from clinic via wheel chair in stable condition. Alert and oriented x 3. F/U with [facility] as scheduled.

## 2020-07-23 NOTE — Patient Instructions (Signed)
[facility]  Discharge Instructions: Thank you for choosing [facility] to provide your oncology and hematology care.  If you have a lab appointment with the [facility], please come in thru the Main Entrance and check in at the main information desk.  Wear comfortable clothing and clothing appropriate for easy access to any Portacath or PICC line.   We strive to give you quality time with your provider. You may need to reschedule your appointment if you arrive late (15 or more minutes).  Arriving late affects you and other patients whose appointments are after yours.  Also, if you miss three or more appointments without notifying the office, you may be dismissed from the clinic at the provider's discretion.      For prescription refill requests, have your pharmacy contact our office and allow 72 hours for refills to be completed.    Today you received the following chemotherapy and/or immunotherapy agents Imfinzi.    To help prevent nausea and vomiting after your treatment, we encourage you to take your nausea medication as directed.  BELOW ARE SYMPTOMS THAT SHOULD BE REPORTED IMMEDIATELY: . *FEVER GREATER THAN 100.4 F (38 C) OR HIGHER . *CHILLS OR SWEATING . *NAUSEA AND VOMITING THAT IS NOT CONTROLLED WITH YOUR NAUSEA MEDICATION . *UNUSUAL SHORTNESS OF BREATH . *UNUSUAL BRUISING OR BLEEDING . *URINARY PROBLEMS (pain or burning when urinating, or frequent urination) . *BOWEL PROBLEMS (unusual diarrhea, constipation, pain near the anus) . TENDERNESS IN MOUTH AND THROAT WITH OR WITHOUT PRESENCE OF ULCERS (sore throat, sores in mouth, or a toothache) . UNUSUAL RASH, SWELLING OR PAIN  . UNUSUAL VAGINAL DISCHARGE OR ITCHING   Items with * indicate a potential emergency and should be followed up as soon as possible or go to the Emergency Department if any problems should occur.  Please show the CHEMOTHERAPY ALERT CARD or IMMUNOTHERAPY ALERT CARD at check-in to the  Emergency Department and triage nurse.  Should you have questions after your visit or need to cancel or reschedule your appointment, please contact [facility] [phone]  and follow the prompts.  Office hours are 8:00 a.m. to 4:30 p.m. Monday - Friday. Please note that voicemails left after 4:00 p.m. may not be returned until the following business day.  We are closed weekends and major holidays. You have access to a nurse at all times for urgent questions. Please call the main number to the clinic [phone] and follow the prompts.  For any non-urgent questions, you may also contact your provider using MyChart. We now offer e-Visits for anyone 49 and older to request care online for non-urgent symptoms. For details visit [email or web address].   Also download the MyChart app! Go to the app store, search "MyChart", open the app, select [facility], and log in with your MyChart username and password.  Due to Covid, a mask is required upon entering the hospital/clinic. If you do not have a mask, one will be given to you upon arrival. For doctor visits, patients may have 1 support person aged 43 or older with them. For treatment visits, patients cannot have anyone with them due to current Covid guidelines and our immunocompromised population.

## 2020-07-26 ENCOUNTER — Other Ambulatory Visit (HOSPITAL_COMMUNITY): Payer: Self-pay | Admitting: Hematology

## 2020-07-26 NOTE — Progress Notes (Signed)
  Patient Name: Denise [name] MRN: [id number] DOB: 08/21/1950 Referring Physician: Iona Beard (Profile Not Attached) Date of Service: 06/14/2020 [facility], [place]                                                        End Of Treatment Note  Diagnoses: C79.31-Secondary malignant neoplasm of brain D32.0-Benign neoplasm of cerebral meninges  Cancer Staging: Cancer Staging Adenocarcinoma of left lung [facility]) Staging form: Lung, AJCC 8th Edition - Clinical stage from 01/02/2019: Stage IIIB (cT3, cN2, cM0) - Signed by Derek Jack, MD on 01/02/2019   Intent: Curative (meningioma) / palliative (metastases)  Radiation Treatment Dates: 06/04/2020 through 06/14/2020 Site Technique Total Dose (Gy) Dose per Fx (Gy) Completed Fx Beam Energies  Brain: Brain_L_Sphen IMRT 25/25 5 5/5 6XFFF  Brain: Brain_SRS_METS IMRT 20/20 20 1/1 6XFFF   Left sphenoid wing meningioma received 25 Gray/51fractions  Left frontal 5 mm metastasis and left frontal 6 mm metastasis received 20 Gray/40fraction  Narrative: The patient tolerated radiosurgery to the brain relatively well.   Plan: The patient will follow-up with radiation oncology in 11mo. -----------------------------------  Eppie Gibson, MD

## 2020-08-03 ENCOUNTER — Ambulatory Visit (INDEPENDENT_AMBULATORY_CARE_PROVIDER_SITE_OTHER): Payer: Medicare Other

## 2020-08-03 DIAGNOSIS — I350 Nonrheumatic aortic (valve) stenosis: Secondary | ICD-10-CM | POA: Diagnosis not present

## 2020-08-03 LAB — ECHOCARDIOGRAM COMPLETE
AR max vel: 0.91 cm2
AV Area VTI: 0.99 cm2
AV Area mean vel: 0.98 cm2
AV Mean grad: 27.6 mmHg
AV Peak grad: 47.2 mmHg
AV Vena cont: 0.27 cm
Ao pk vel: 3.43 m/s
Area-P 1/2: 2.76 cm2
Calc EF: 61 %
MV M vel: 4.73 m/s
MV Peak grad: 89.6 mmHg
P 1/2 time: 545 msec
S' Lateral: 2.35 cm
Single Plane A2C EF: 56.9 %
Single Plane A4C EF: 60.9 %

## 2020-08-04 ENCOUNTER — Telehealth: Payer: Self-pay | Admitting: *Deleted

## 2020-08-04 NOTE — Telephone Encounter (Signed)
Patient informed. Copy sent to PCP °

## 2020-08-04 NOTE — Telephone Encounter (Signed)
-----   Message from Satira Sark, MD sent at 08/03/2020  2:02 PM EDT ----- Results reviewed.  LVEF normal at 65 to 70%.  Small pericardial effusion is likely not symptom provoking.  Aortic valve stenosis is moderate range, gradients have increased somewhat since the last study.  Keep follow-up as scheduled.

## 2020-08-05 ENCOUNTER — Ambulatory Visit: Payer: Medicare Other | Admitting: Cardiology

## 2020-08-18 ENCOUNTER — Other Ambulatory Visit: Payer: Self-pay | Admitting: Radiation Therapy

## 2020-08-18 DIAGNOSIS — C7931 Secondary malignant neoplasm of brain: Secondary | ICD-10-CM

## 2020-08-24 ENCOUNTER — Other Ambulatory Visit: Payer: Medicare Other

## 2020-08-24 ENCOUNTER — Telehealth: Payer: Self-pay | Admitting: Radiation Therapy

## 2020-08-24 NOTE — Telephone Encounter (Addendum)
Left a detailed message on the patient's home line, about her upcoming brain MRI and follow-up with Dr. Christella Noa in July. I also included my contact information and requested a call back to verify that she received the message and to answer any questions she might have.   Mont Dutton R.T.(R)(T) Radiation Special Procedures Navigator   Follow-up with Dr. Christella Noa 7/11 @ 10:30  UPDATE: 08/25/20 Pt called back to confirm she received my message and that these appointments work out with her schedule.   Mont Dutton R.T.(R)(T)

## 2020-08-30 ENCOUNTER — Inpatient Hospital Stay (HOSPITAL_COMMUNITY): Payer: Medicare Other | Attending: Hematology

## 2020-08-30 ENCOUNTER — Other Ambulatory Visit: Payer: Self-pay

## 2020-08-30 ENCOUNTER — Encounter (HOSPITAL_COMMUNITY): Payer: Self-pay | Admitting: Radiology

## 2020-08-30 ENCOUNTER — Ambulatory Visit (HOSPITAL_COMMUNITY)
Admission: RE | Admit: 2020-08-30 | Discharge: 2020-08-30 | Disposition: A | Discharge status: Home / Self Care | Payer: Medicare Other | Source: Ambulatory Visit | Attending: Hematology | Admitting: Hematology

## 2020-08-30 DIAGNOSIS — I10 Essential (primary) hypertension: Secondary | ICD-10-CM | POA: Diagnosis not present

## 2020-08-30 DIAGNOSIS — Z79899 Other long term (current) drug therapy: Secondary | ICD-10-CM | POA: Diagnosis not present

## 2020-08-30 DIAGNOSIS — E119 Type 2 diabetes mellitus without complications: Secondary | ICD-10-CM | POA: Diagnosis not present

## 2020-08-30 DIAGNOSIS — Z923 Personal history of irradiation: Secondary | ICD-10-CM | POA: Insufficient documentation

## 2020-08-30 DIAGNOSIS — Z9884 Bariatric surgery status: Secondary | ICD-10-CM | POA: Diagnosis not present

## 2020-08-30 DIAGNOSIS — I2510 Atherosclerotic heart disease of native coronary artery without angina pectoris: Secondary | ICD-10-CM | POA: Insufficient documentation

## 2020-08-30 DIAGNOSIS — J8410 Pulmonary fibrosis, unspecified: Secondary | ICD-10-CM | POA: Diagnosis not present

## 2020-08-30 DIAGNOSIS — C7931 Secondary malignant neoplasm of brain: Secondary | ICD-10-CM | POA: Diagnosis not present

## 2020-08-30 DIAGNOSIS — I700 Atherosclerosis of aorta: Secondary | ICD-10-CM | POA: Diagnosis not present

## 2020-08-30 DIAGNOSIS — K219 Gastro-esophageal reflux disease without esophagitis: Secondary | ICD-10-CM | POA: Insufficient documentation

## 2020-08-30 DIAGNOSIS — C3492 Malignant neoplasm of unspecified part of left bronchus or lung: Secondary | ICD-10-CM | POA: Insufficient documentation

## 2020-08-30 DIAGNOSIS — Z9221 Personal history of antineoplastic chemotherapy: Secondary | ICD-10-CM | POA: Diagnosis not present

## 2020-08-30 DIAGNOSIS — C3412 Malignant neoplasm of upper lobe, left bronchus or lung: Secondary | ICD-10-CM | POA: Insufficient documentation

## 2020-08-30 DIAGNOSIS — Z7982 Long term (current) use of aspirin: Secondary | ICD-10-CM | POA: Diagnosis not present

## 2020-08-30 DIAGNOSIS — I313 Pericardial effusion (noninflammatory): Secondary | ICD-10-CM | POA: Diagnosis not present

## 2020-08-30 LAB — COMPREHENSIVE METABOLIC PANEL
ALT: 18 U/L (ref 0–44)
AST: 18 U/L (ref 15–41)
Albumin: 3.5 g/dL (ref 3.5–5.0)
Alkaline Phosphatase: 74 U/L (ref 38–126)
Anion gap: 6 (ref 5–15)
BUN: 15 mg/dL (ref 8–23)
CO2: 26 mmol/L (ref 22–32)
Calcium: 8.7 mg/dL — ABNORMAL LOW (ref 8.9–10.3)
Chloride: 109 mmol/L (ref 98–111)
Creatinine, Ser: 0.66 mg/dL (ref 0.44–1.00)
GFR, Estimated: >60 mL/min (ref >60)
Glucose, Bld: 103 mg/dL — ABNORMAL HIGH (ref 70–99)
Potassium: 3.9 mmol/L (ref 3.5–5.1)
Sodium: 141 mmol/L (ref 135–145)
Total Bilirubin: 0.3 mg/dL (ref 0.3–1.2)
Total Protein: 6.5 g/dL (ref 6.5–8.1)

## 2020-08-30 LAB — CBC WITH DIFFERENTIAL/PLATELET
Abs Immature Granulocytes: 0 10*3/uL (ref 0.00–0.07)
Basophils Absolute: 0 10*3/uL (ref 0.0–0.1)
Basophils Relative: 1 %
Eosinophils Absolute: 0.1 10*3/uL (ref 0.0–0.5)
Eosinophils Relative: 3 %
HCT: 37.2 % (ref 36.0–46.0)
Hemoglobin: 11.6 g/dL — ABNORMAL LOW (ref 12.0–15.0)
Immature Granulocytes: 0 %
Lymphocytes Relative: 14 %
Lymphs Abs: 0.6 10*3/uL — ABNORMAL LOW (ref 0.7–4.0)
MCH: 27.5 pg (ref 26.0–34.0)
MCHC: 31.2 g/dL (ref 30.0–36.0)
MCV: 88.2 fL (ref 80.0–100.0)
Monocytes Absolute: 0.4 10*3/uL (ref 0.1–1.0)
Monocytes Relative: 8 %
Neutro Abs: 3.3 10*3/uL (ref 1.7–7.7)
Neutrophils Relative %: 74 %
Platelets: 328 10*3/uL (ref 150–400)
RBC: 4.22 MIL/uL (ref 3.87–5.11)
RDW: 15 % (ref 11.5–15.5)
WBC: 4.4 10*3/uL (ref 4.0–10.5)
nRBC: 0 % (ref 0.0–0.2)

## 2020-08-30 LAB — TSH: TSH: 1.302 u[IU]/mL (ref 0.350–4.500)

## 2020-08-30 IMAGING — CT CT CHEST W/ CM
2 of 3 series · 15 of 36 positions shown, 18 images · IV contrast (omnipaque)
Comparison: [DATE]

CLINICAL DATA: Left lung adenocarcinoma metastatic to brain,
restaging, assess treatment response

EXAM:
CT CHEST WITH CONTRAST
TECHNIQUE: Multidetector CT imaging of the chest was performed during
intravenous contrast administration.
CONTRAST:  75mL OMNIPAQUE IOHEXOL 300 MG/ML  SOLN

[Series 2: routine chest with · axial · 0.87mm/px · z∈[-312,-68]mm · 12 of 144 slices shown, 15 images]
[im 11/144  mediastinal]
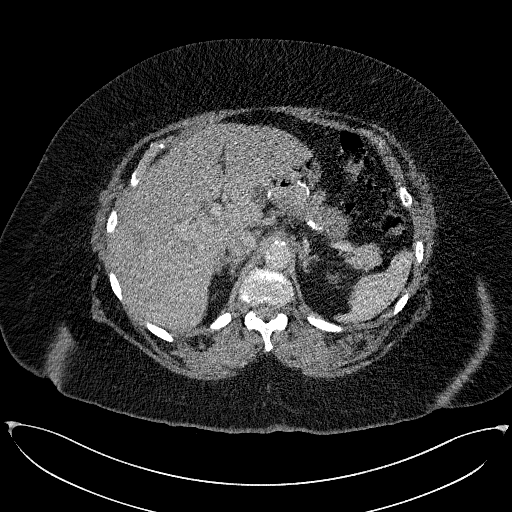
[im 11/144  lung]
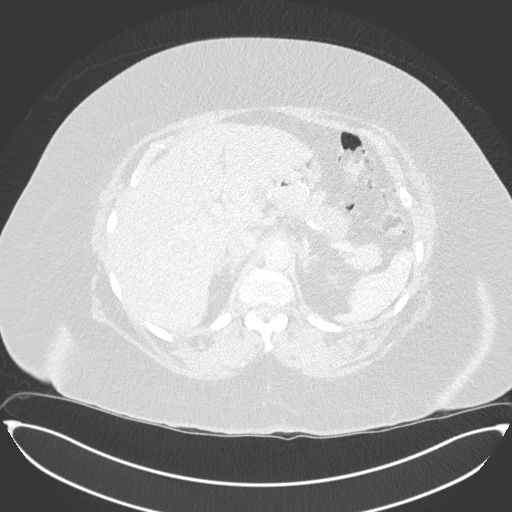
[im 22/144  lung]
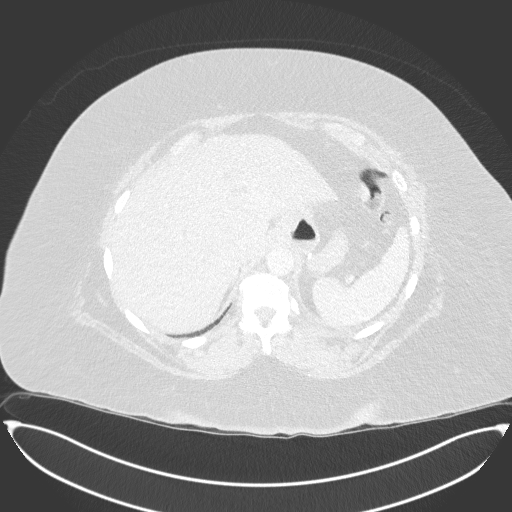
[im 32/144  lung]
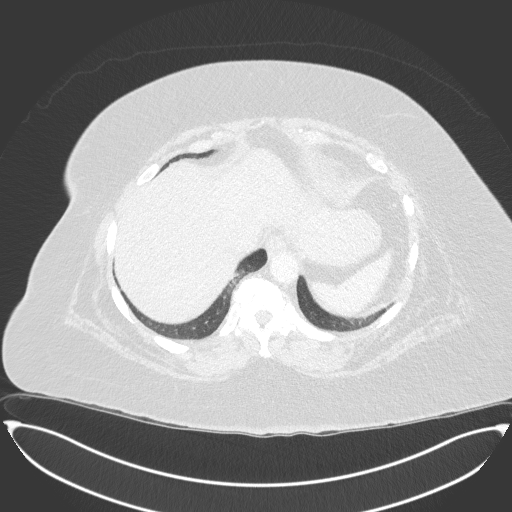
[im 43/144  lung]
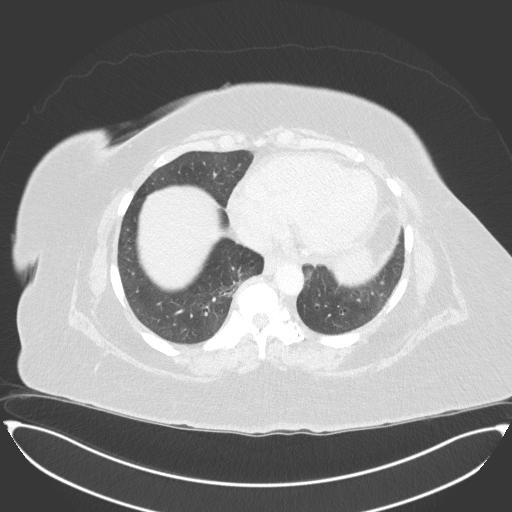
[im 53/144  mediastinal]
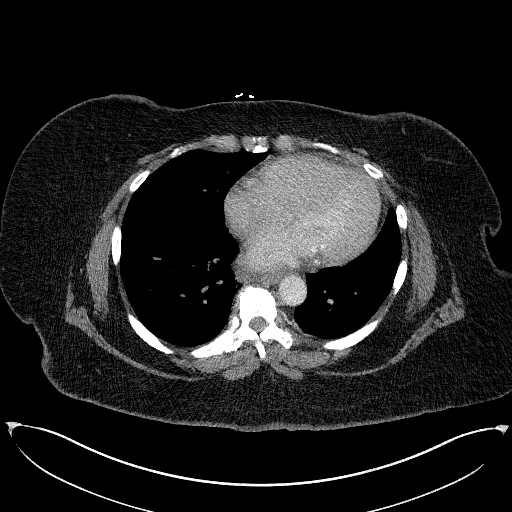
[im 53/144  lung]
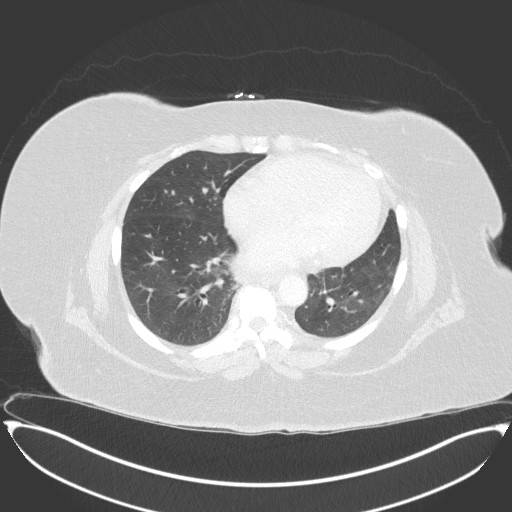
[im 64/144  lung]
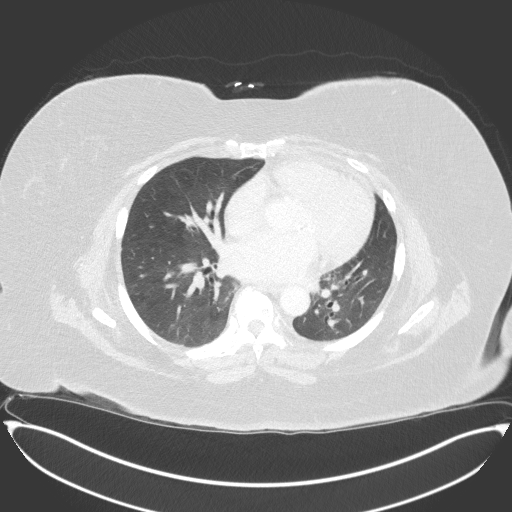
[im 80/144  lung]
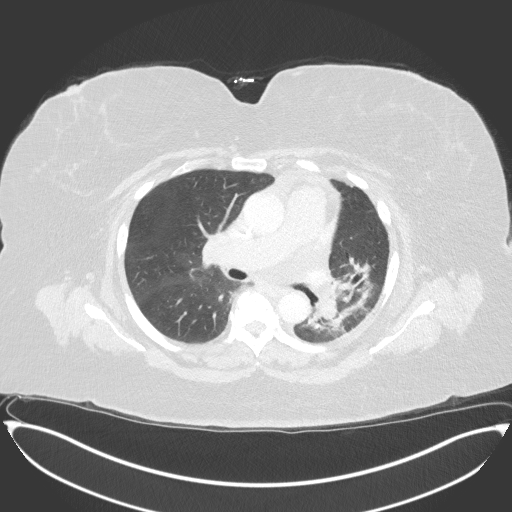
[im 91/144  lung]
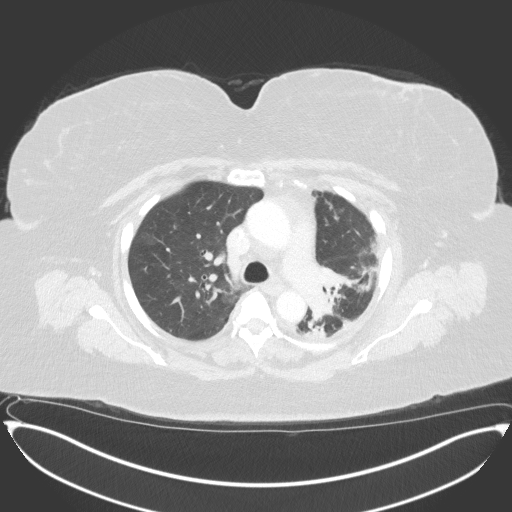
[im 101/144  mediastinal]
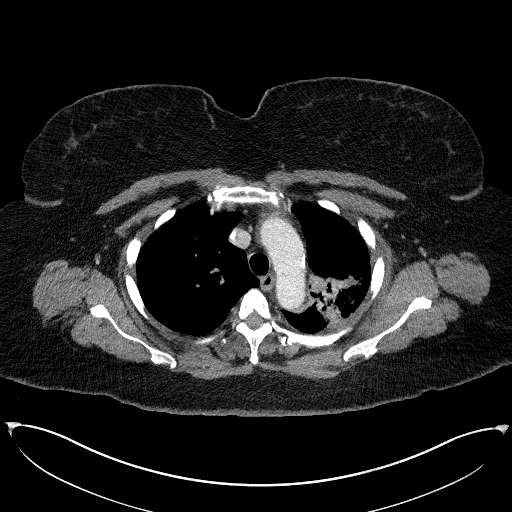
[im 101/144  lung]
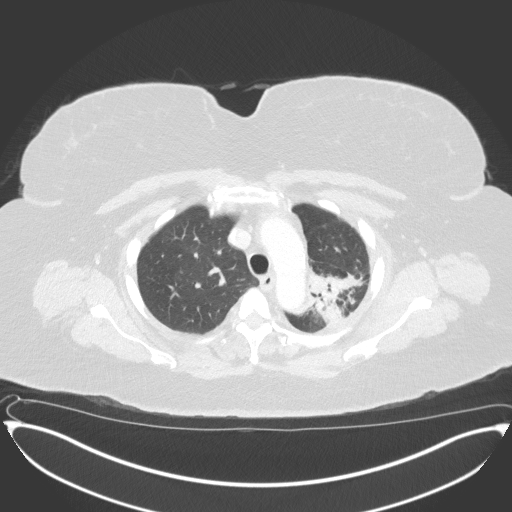
[im 112/144  lung]
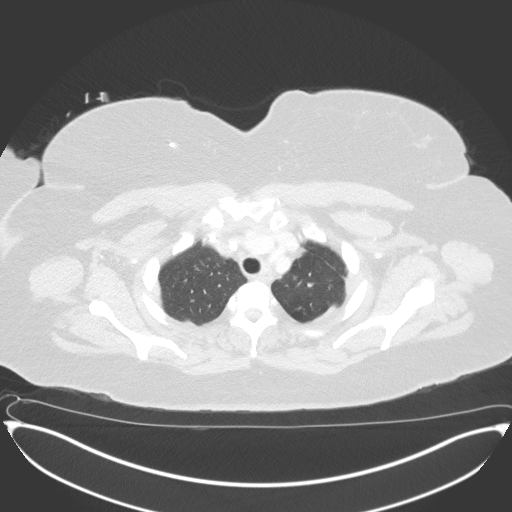
[im 122/144  lung]
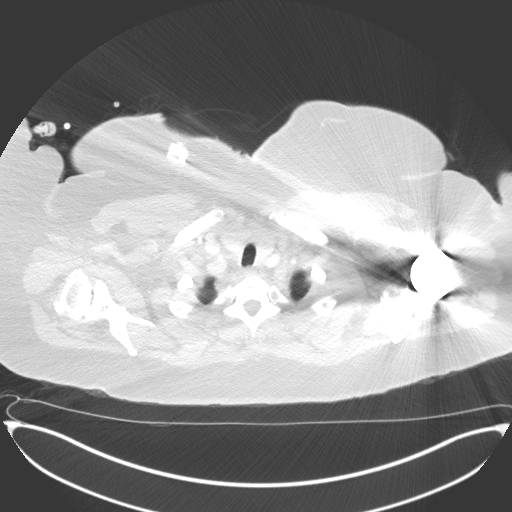
[im 133/144  lung]
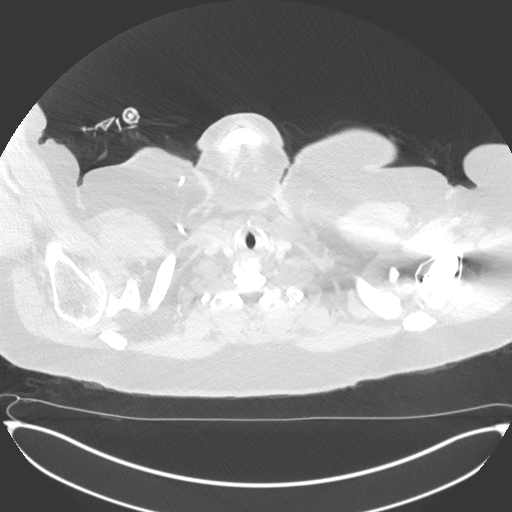

[Series 5: coronal · coronal · 0.62mm/px · 3 of 173 slices shown]
[im 35/173  lung]
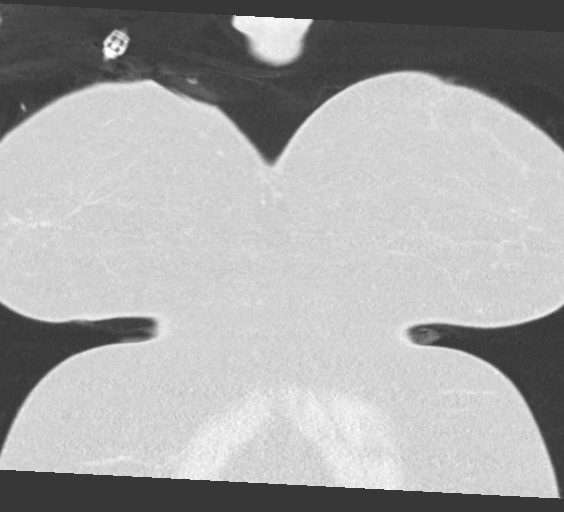
[im 69/173  lung]
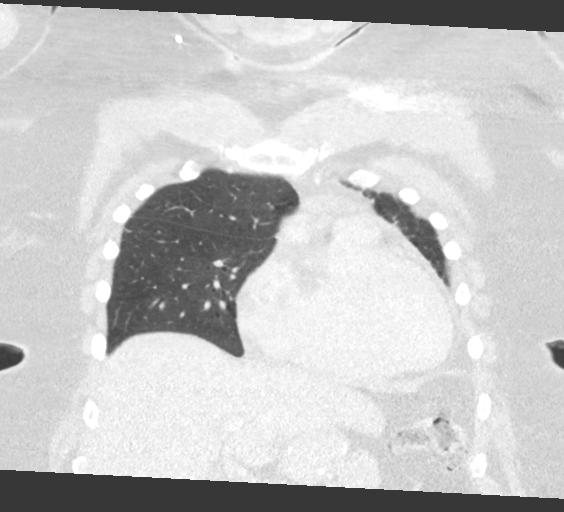
[im 104/173  lung]
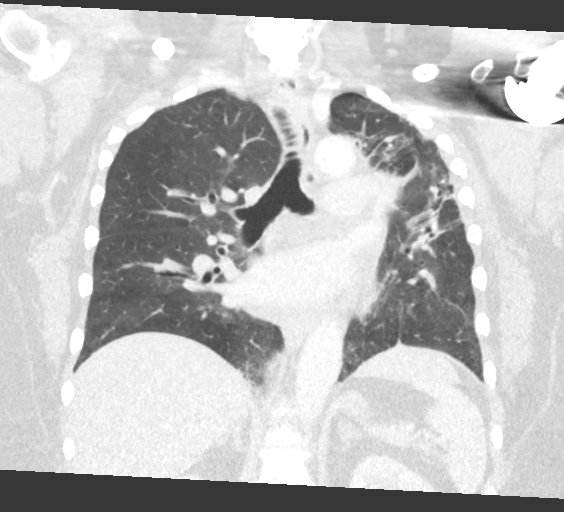

[15 of 36 positions shown; findings below may reference images not displayed]

FINDINGS: Cardiovascular: Right chest port catheter. Aortic atherosclerosis.
Normal heart size. Trace pericardial effusion, unchanged.

Mediastinum/Nodes: Unchanged post treatment appearance of soft
tissue in the left hilum. No discretely enlarged mediastinal, hilar,
or axillary lymph nodes. Thyroid gland, trachea, and esophagus
demonstrate no significant findings.

Lungs/Pleura: Unchanged post treatment appearance of the left chest
with dense perihilar and suprahilar post treatment fibrosis and
consolidation as well as architectural distortion and volume loss.
No pleural effusion or pneumothorax.

Upper Abdomen: No acute abnormality. Partially imaged postoperative
findings of gastric bypass.

Musculoskeletal: No chest wall mass or suspicious bone lesions
identified. Status post left shoulder arthroplasty.
IMPRESSION: 1. Unchanged post treatment appearance of the left chest with dense
perihilar and suprahilar post treatment fibrosis and consolidation
as well as architectural distortion and volume loss.
2. Unchanged post treatment appearance of soft tissue in the left
hilum. No lymphadenopathy.
3. No evidence of recurrent or metastatic disease in the chest.

Aortic Atherosclerosis ([Y8]-[Y8]).

## 2020-08-30 MED ORDER — IOHEXOL 300 MG/ML  SOLN
75.0000 mL | Freq: Once | INTRAMUSCULAR | Status: AC | PRN
  Administered 2020-08-30: 75 mL via INTRAVENOUS

## 2020-08-30 MED ORDER — HEPARIN SOD (PORK) LOCK FLUSH 100 UNIT/ML IV SOLN
500.0000 [IU] | Freq: Once | INTRAVENOUS | Status: AC
  Administered 2020-08-30: 500 [IU] via INTRAVENOUS

## 2020-08-30 MED ORDER — SODIUM CHLORIDE 0.9% FLUSH
10.0000 mL | Freq: Once | INTRAVENOUS | Status: AC
  Administered 2020-08-30: 10 mL via INTRAVENOUS

## 2020-08-30 MED ORDER — SODIUM CHLORIDE 0.9% FLUSH
10.0000 mL | INTRAVENOUS | Status: DC | PRN
  Administered 2020-08-30: 10 mL via INTRAVENOUS

## 2020-08-30 NOTE — Progress Notes (Signed)
Patients port flushed without difficulty.  Good blood return noted with no bruising or swelling noted at site.  Patient remains accessed for CT scan today.  Patient stable with no complaints voiced.

## 2020-08-30 NOTE — Progress Notes (Signed)
  Denise [name] presented for Portacath de-access and flush.  Portacath clean, dry and intact.  Portacath flushed with 10 ml NS and 500U/67ml Heparin and needle removed intact.  Procedure tolerated well and without incident.    No complaints at this time. Discharged from clinic ambulatory in stable condition. Alert and oriented x 3. F/U with [facility] as scheduled.

## 2020-08-30 NOTE — Addendum Note (Signed)
Addended by: Benjiman Core D on: 08/30/2020 10:49 AM   Modules accepted: Orders

## 2020-09-06 ENCOUNTER — Ambulatory Visit (HOSPITAL_COMMUNITY): Payer: Medicare Other | Admitting: Hematology

## 2020-09-16 ENCOUNTER — Other Ambulatory Visit: Payer: Self-pay

## 2020-09-16 ENCOUNTER — Encounter (HOSPITAL_COMMUNITY): Payer: Self-pay | Admitting: Hematology and Oncology

## 2020-09-16 ENCOUNTER — Inpatient Hospital Stay (HOSPITAL_BASED_OUTPATIENT_CLINIC_OR_DEPARTMENT_OTHER): Payer: Medicare Other | Admitting: Hematology and Oncology

## 2020-09-16 VITALS — BP 150/77 | HR 89 | Temp 97.9°F | Resp 22 | Wt 278.8 lb

## 2020-09-16 DIAGNOSIS — C3492 Malignant neoplasm of unspecified part of left bronchus or lung: Secondary | ICD-10-CM

## 2020-09-16 DIAGNOSIS — I2510 Atherosclerotic heart disease of native coronary artery without angina pectoris: Secondary | ICD-10-CM | POA: Diagnosis not present

## 2020-09-16 DIAGNOSIS — C3412 Malignant neoplasm of upper lobe, left bronchus or lung: Secondary | ICD-10-CM | POA: Diagnosis not present

## 2020-09-16 DIAGNOSIS — I10 Essential (primary) hypertension: Secondary | ICD-10-CM | POA: Diagnosis not present

## 2020-09-16 DIAGNOSIS — K219 Gastro-esophageal reflux disease without esophagitis: Secondary | ICD-10-CM | POA: Diagnosis not present

## 2020-09-16 DIAGNOSIS — E119 Type 2 diabetes mellitus without complications: Secondary | ICD-10-CM | POA: Diagnosis not present

## 2020-09-16 DIAGNOSIS — C7931 Secondary malignant neoplasm of brain: Secondary | ICD-10-CM | POA: Diagnosis not present

## 2020-09-16 NOTE — Progress Notes (Signed)
[address], [place] [address]   CLINIC:  Medical Oncology/Hematology  PCP:  Iona Beard, [address] / [place] [address] [phone]   REASON FOR VISIT:  Follow-up for stage III left lung adenocarcinoma  PRIOR THERAPY:  1. Chemoradiation with carboplatin and paclitaxel from 01/07/2019 to 02/11/2019. 2. Consolidation with durvalumab from 03/19/2019 to 04/16/2019, held due to pneumonitis.  NGS Results: Not done  CURRENT THERAPY: Consolidation with durvalumab every 2 weeks; brain mets SRS in 5 fractions  BRIEF ONCOLOGIC HISTORY:  Oncology History  Adenocarcinoma of left lung ([facility])  12/09/2018 Initial Diagnosis   Adenocarcinoma of left lung ([facility])   01/02/2019 Cancer Staging   Staging form: Lung, AJCC 8th Edition - Clinical stage from 01/02/2019: Stage IIIB (cT3, cN2, cM0) - Signed by Derek Jack, MD on 01/02/2019   01/07/2019 - 02/11/2019 Chemotherapy   The patient had palonosetron (ALOXI) injection 0.25 mg, 0.25 mg, Intravenous,  Once, 6 of 6 cycles Administration: 0.25 mg (01/07/2019), 0.25 mg (01/14/2019), 0.25 mg (01/21/2019), 0.25 mg (01/28/2019), 0.25 mg (02/04/2019), 0.25 mg (02/11/2019) CARBOplatin (PARAPLATIN) 270 mg in sodium chloride 0.9 % 250 mL chemo infusion, 270 mg (100 % of original dose 266.4 mg), Intravenous,  Once, 6 of 6 cycles Dose modification:   (original dose 266.4 mg, Cycle 1),   (original dose 266.4 mg, Cycle 2),   (original dose 266.4 mg, Cycle 3),   (original dose 266.4 mg, Cycle 4) Administration: 270 mg (01/07/2019), 270 mg (01/14/2019), 270 mg (01/21/2019), 270 mg (01/28/2019), 270 mg (02/04/2019), 270 mg (02/11/2019) PACLitaxel (TAXOL) 108 mg in sodium chloride 0.9 % 250 mL chemo infusion (</= 80mg /m2), 45 mg/m2 = 108 mg, Intravenous,  Once, 6 of 6 cycles Administration: 108 mg (01/07/2019), 108 mg (01/14/2019), 108 mg (01/21/2019), 108 mg (01/28/2019), 108 mg (02/04/2019), 108 mg  (02/11/2019) fosaprepitant (EMEND) 150 mg, dexamethasone (DECADRON) 12 mg in sodium chloride 0.9 % 145 mL IVPB, , Intravenous,  Once, 5 of 5 cycles Administration:  (01/14/2019),  (01/21/2019),  (01/28/2019),  (02/04/2019),  (02/11/2019)  for chemotherapy treatment.    03/19/2019 -  Chemotherapy    Patient is on Treatment Plan: LUNG DURVALUMAB Q14D        CANCER STAGING: Cancer Staging Adenocarcinoma of left lung [facility]) Staging form: Lung, AJCC 8th Edition - Clinical stage from 01/02/2019: Stage IIIB (cT3, cN2, cM0) - Signed by Derek Jack, MD on 01/02/2019   INTERVAL HISTORY:  [name], a 70 y.o. female, returns for routine follow-up and consideration for next cycle of immunotherapy. Denise [name] was last seen on 06/10/2020. She is feeling well today. No complaints SOB at baseline. Besides difficulty remembering things, no neurological complaints.  REVIEW OF SYSTEMS:  Review of Systems  Constitutional:  Positive for fatigue. Negative for appetite change.  Respiratory:  Positive for shortness of breath (stable). Negative for cough.   Gastrointestinal:  Negative for diarrhea.  Skin:  Negative for itching and rash.  All other systems reviewed and are negative.  PAST MEDICAL/SURGICAL HISTORY:  Past Medical History:  Diagnosis Date   Anemia    Aortic stenosis    Arthritis    Cancer (HCC)    Lung   Coronary artery calcification seen on CT scan    Essential hypertension    GERD (gastroesophageal reflux disease)    History of lung cancer    Stage III adenocarcinoma status post chemoradiation   Port-A-Cath in place 01/06/2019   Type 2 diabetes mellitus ([facility])  Past Surgical History:  Procedure Laterality Date   CHOLECYSTECTOMY  1997   COLONOSCOPY     GASTRIC BYPASS     INCISIONAL HERNIA REPAIR  04/11/11   IR IMAGING GUIDED PORT INSERTION  12/27/2018   Right   LAPAROSCOPIC SALPINGOOPHERECTOMY     LAPAROTOMY  04/11/2011   Procedure: EXPLORATORY LAPAROTOMY;   Surgeon: Joyice Faster. Cornett, MD;  Location: [facility];  Service: General;  Laterality: N/A;  closure port hole   RIGHT/LEFT HEART CATH AND CORONARY ANGIOGRAPHY N/A 12/29/2019   Procedure: RIGHT/LEFT HEART CATH AND CORONARY ANGIOGRAPHY;  Surgeon: Troy Sine, MD;  Location: [facility];  Service: Cardiovascular;  Laterality: N/A;   TOTAL HIP ARTHROPLASTY  03/07/2012   Procedure: TOTAL HIP ARTHROPLASTY ANTERIOR APPROACH;  Surgeon: Mauri Pole, MD;  Location: [facility];  Service: Orthopedics;  Laterality: Right;   TOTAL SHOULDER ARTHROPLASTY Left 01/21/2015   TOTAL SHOULDER ARTHROPLASTY Left 01/21/2015   Procedure: LEFT TOTAL SHOULDER ARTHROPLASTY;  Surgeon: Justice Britain, MD;  Location: [facility];  Service: Orthopedics;  Laterality: Left;   VAGINAL HYSTERECTOMY      SOCIAL HISTORY:  Social History   Socioeconomic History   Marital status: Single    Spouse name: Not on file   Number of children: Not on file   Years of education: 12th grade   Highest education level: Not on file  Occupational History   Occupation: Employed    Employer: [employer]  Tobacco Use   Smoking status: Former    Packs/day: 1.00    Years: 12.00    Pack years: 12.00    Types: Cigarettes    Quit date: 03/20/1976    Years since quitting: 44.5   Smokeless tobacco: Never  Vaping Use   Vaping Use: Never used  Substance and Sexual Activity   Alcohol use: No   Drug use: No   Sexual activity: Not Currently  Other Topics Concern   Not on file  Social History Narrative   Not on file   Social Determinants of Health   Financial Resource Strain: Not on file  Food Insecurity: Not on file  Transportation Needs: Not on file  Physical Activity: Not on file  Stress: Not on file  Social Connections: Not on file  Intimate Partner Violence: Not on file    FAMILY HISTORY:  Family History  Problem Relation Age of Onset   Breast cancer Mother    COPD Mother    Arthritis Mother    Diabetes Mother     Hypertension Mother    Hypertension Father    Diabetes Father    Breast cancer Sister    Thyroid cancer Brother    Huntington's disease Maternal Grandmother    Heart attack Brother     CURRENT MEDICATIONS:  Current Outpatient Medications  Medication Sig Dispense Refill   Acetaminophen (TYLENOL 8 HOUR ARTHRITIS PAIN PO) Take 650 ng by mouth 2 (two) times daily.      albuterol (VENTOLIN HFA) 108 (90 Base) MCG/ACT inhaler TAKE 2 PUFFS BY MOUTH EVERY 6 HOURS AS NEEDED FOR WHEEZE OR SHORTNESS OF BREATH 18 each 6   amLODipine (NORVASC) 5 MG tablet Take 5 mg by mouth daily.      aspirin EC 81 MG tablet Take 81 mg by mouth daily. Swallow whole.     Calcium Carbonate Antacid (TUMS PO) Take 4 tablets by mouth daily.     carboxymethylcellul-glycerin (OPTIVE) 0.5-0.9 % ophthalmic solution Place 1-2 drops into both eyes 3 (three)  times daily as needed (dry/irritated eyes.).     carvedilol (COREG) 3.125 MG tablet TAKE 1 TABLET BY MOUTH 2 TIMES DAILY. 180 tablet 2   Cyanocobalamin (VITAMIN B-12) 2500 MCG SUBL Place 2,500 mcg under the tongue every morning.      diphenhydrAMINE (BENADRYL) 25 MG tablet Take 25 mg by mouth every 6 (six) hours as needed for itching or allergies.     DURVALUMAB IV Inject into the vein every 14 (fourteen) days.     ferrous sulfate 325 (65 FE) MG tablet Take 325 mg by mouth every evening.     furosemide (LASIX) 20 MG tablet TAKE 1 TABLET BY MOUTH AS NEEDED FOR FLUID OR EDEMA (DAILY AS NEEDED FOR SWELLING). 90 tablet 1   gabapentin (NEURONTIN) 300 MG capsule Take by mouth.     HYDROcodone-acetaminophen (NORCO/VICODIN) 5-325 MG tablet Take 1 tablet by mouth 2 (two) times daily as needed.     lidocaine-prilocaine (EMLA) cream APPLY SMALL AMOUNT TO PORT A CATH SITE AND COVER WITH PLASTIC WRAP 1 HR PRIOR TO CHEMOTHERAPY APPTS 30 g 3   Multiple Vitamin (MULITIVITAMIN WITH MINERALS) TABS Take 1 tablet by mouth daily.     naproxen (NAPROSYN) 500 MG tablet Take 500 mg by mouth every 12  (twelve) hours.      omeprazole (PRILOSEC) 20 MG capsule Take 20 mg by mouth daily.     Current Facility-Administered Medications  Medication Dose Route Frequency Provider Last Rate Last Admin   sodium chloride flush (NS) 0.9 % injection 3 mL  3 mL Intravenous Q12H Satira Sark, MD        ALLERGIES:  No Known Allergies  PHYSICAL EXAM:  Performance status (ECOG): 1 - Symptomatic but completely ambulatory  Vitals:   09/16/20 1033  BP: (!) 150/77  Pulse: 89  Resp: (!) 22  Temp: 97.9 F (36.6 C)  SpO2: 95%   Wt Readings from Last 3 Encounters:  09/16/20 278 lb 12.8 oz (126.5 kg)  07/21/20 284 lb 6.4 oz (129 kg)  07/08/20 285 lb 7.9 oz (129.5 kg)   Physical Exam Vitals reviewed.  Constitutional:      Appearance: Normal appearance. She is obese.  Cardiovascular:     Rate and Rhythm: Normal rate and regular rhythm.     Pulses: Normal pulses.     Heart sounds: Murmur (systolic murmur) heard.  Pulmonary:     Effort: Pulmonary effort is normal.     Breath sounds: Normal breath sounds.  Musculoskeletal:     Right lower leg: Edema (1+) present.     Left lower leg: Edema (1+) present.  Neurological:     General: No focal deficit present.     Mental Status: She is alert and oriented to person, place, and time.  Psychiatric:        Mood and Affect: Mood normal.        Behavior: Behavior normal.    LABORATORY DATA:  I have reviewed the labs as listed.  CBC Latest Ref Rng & Units 08/30/2020 07/23/2020 07/08/2020  WBC 4.0 - 10.5 K/uL 4.4 4.5 4.3  Hemoglobin 12.0 - 15.0 g/dL 11.6(L) 11.5(L) 11.8(L)  Hematocrit 36.0 - 46.0 % 37.2 37.4 38.1  Platelets 150 - 400 K/uL 328 347 343   CMP Latest Ref Rng & Units 08/30/2020 07/23/2020 07/08/2020  Glucose 70 - 99 mg/dL 103(H) 107(H) 107(H)  BUN 8 - 23 mg/dL 15 9 10   Creatinine 0.44 - 1.00 mg/dL 0.66 0.69 0.61  Sodium 135 - 145  mmol/L 141 140 140  Potassium 3.5 - 5.1 mmol/L 3.9 3.8 3.8  Chloride 98 - 111 mmol/L 109 109 108  CO2 22 -  32 mmol/L 26 25 26   Calcium 8.9 - 10.3 mg/dL 8.7(L) 8.7(L) 8.8(L)  Total Protein 6.5 - 8.1 g/dL 6.5 6.2(L) 6.4(L)  Total Bilirubin 0.3 - 1.2 mg/dL 0.3 0.4 0.6  Alkaline Phos 38 - 126 U/L 74 74 80  AST 15 - 41 U/L 18 19 20   ALT 0 - 44 U/L 18 19 22     DIAGNOSTIC IMAGING:  I have independently reviewed the scans and discussed with the patient. CT Chest W Contrast  Result Date: 08/30/2020 CLINICAL DATA:  Left lung adenocarcinoma metastatic to brain, restaging, assess treatment response EXAM: CT CHEST WITH CONTRAST TECHNIQUE: Multidetector CT imaging of the chest was performed during intravenous contrast administration. CONTRAST:  47mL OMNIPAQUE IOHEXOL 300 MG/ML  SOLN COMPARISON:  06/07/2020 FINDINGS: Cardiovascular: Right chest port catheter. Aortic atherosclerosis. Normal heart size. Trace pericardial effusion, unchanged. Mediastinum/Nodes: Unchanged post treatment appearance of soft tissue in the left hilum. No discretely enlarged mediastinal, hilar, or axillary lymph nodes. Thyroid gland, trachea, and esophagus demonstrate no significant findings. Lungs/Pleura: Unchanged post treatment appearance of the left chest with dense perihilar and suprahilar post treatment fibrosis and consolidation as well as architectural distortion and volume loss. No pleural effusion or pneumothorax. Upper Abdomen: No acute abnormality. Partially imaged postoperative findings of gastric bypass. Musculoskeletal: No chest wall mass or suspicious bone lesions identified. Status post left shoulder arthroplasty. IMPRESSION: 1. Unchanged post treatment appearance of the left chest with dense perihilar and suprahilar post treatment fibrosis and consolidation as well as architectural distortion and volume loss. 2. Unchanged post treatment appearance of soft tissue in the left hilum. No lymphadenopathy. 3. No evidence of recurrent or metastatic disease in the chest. Aortic Atherosclerosis (ICD10-I70.0). Electronically Signed   By:  Eddie Candle M.D.   On: 08/30/2020 14:13     ASSESSMENT:  1.  Adenocarcinoma of left lung (HCC) -Chemoradiation therapy with carboplatin and paclitaxel from 01/07/2019 through 02/11/2019. -Consolidation immunotherapy with durvalumab from 03/19/2019 through 04/15/2018, held due to pneumonitis. -CT chest on 07/02/2019 shows left upper lobe lung mass measuring 2.6 x 2.0 cm.  It shows improvement in size. -MRI of the brain on 07/18/2019 showed left sphenoid wing meningioma measuring 2.6 x 2.0 x 2.6 cm unchanged.  No new enhancing intracranial lesion.  Increased left temporal white matter edema. -Durvalumab restarted on 07/24/2019. -We reviewed CT chest with contrast from 09/29/2019 which showed stable appearance of treated lung lesion within the posterior left upper lobe.  New bandlike area of increased soft tissue density within the anterior to the treated lung lesion favored to be progressive changes from radiation.  Local tumor recurrence is less favored.  Stable pericardial effusion. -CT chest on 12/10/2019 shows posttreatment appearance of the left lung with associated scarring and volume loss.  No evidence of recurrence or metastatic disease.  Unchanged small pericardial effusion. -She was evaluated by cardiology with a stress test.  EF was 46%.  She underwent cardiac catheterization which did not show any abnormalities.   PLAN:  1.  Clinical stage IIIb (T3N2) adenocarcinoma of the lung: -completed 1 yr of adjuvant imfinzi. - Unchanged post treatment CT scan No evidence of metastatic disease. RTC in 3 months with repeat staging. CT chest ordered, request made to include adrenals in CT  2.  Shortness of breath on exertion: -Continue follow-up with pulmonary.  Continue inhalers.  3.  Meningioma/brain mets: -She completed SRS treatments to the brain. __repeat MRI pending, scheduled July 7 th.   4.  Hypertension: -Continue Norvasc daily.  Systolic blood pressure is 150 today.   5.  Bilateral  leg swelling: -Continue Lasix as needed.     Orders placed this encounter:  No orders of the defined types were placed in this encounter.  Benay Pike MD

## 2020-09-17 ENCOUNTER — Other Ambulatory Visit (HOSPITAL_COMMUNITY): Payer: Self-pay

## 2020-09-17 DIAGNOSIS — M25561 Pain in right knee: Secondary | ICD-10-CM | POA: Diagnosis not present

## 2020-09-17 DIAGNOSIS — M1711 Unilateral primary osteoarthritis, right knee: Secondary | ICD-10-CM | POA: Diagnosis not present

## 2020-09-17 DIAGNOSIS — C3492 Malignant neoplasm of unspecified part of left bronchus or lung: Secondary | ICD-10-CM

## 2020-09-17 DIAGNOSIS — Z6841 Body Mass Index (BMI) 40.0 and over, adult: Secondary | ICD-10-CM | POA: Diagnosis not present

## 2020-09-23 ENCOUNTER — Other Ambulatory Visit: Payer: Self-pay

## 2020-09-23 ENCOUNTER — Ambulatory Visit (HOSPITAL_COMMUNITY)
Admission: RE | Admit: 2020-09-23 | Discharge: 2020-09-23 | Disposition: A | Discharge status: Home / Self Care | Payer: Medicare Other | Source: Ambulatory Visit | Attending: Radiation Oncology | Admitting: Radiation Oncology

## 2020-09-23 DIAGNOSIS — C7931 Secondary malignant neoplasm of brain: Secondary | ICD-10-CM | POA: Diagnosis not present

## 2020-09-23 DIAGNOSIS — G9389 Other specified disorders of brain: Secondary | ICD-10-CM | POA: Diagnosis not present

## 2020-09-23 IMAGING — MR MR HEAD WO/W CM
10 of 14 series · 21 of 48 positions shown · IV contrast (10 gad)
Comparison: [DATE]

CLINICAL DATA: Meningioma post SRS

EXAM:
MRI HEAD WITHOUT AND WITH CONTRAST
TECHNIQUE: Multiplanar, multiecho pulse sequences of the brain and surrounding
structures were obtained without and with intravenous contrast.
CONTRAST:  10mL GADAVIST GADOBUTROL 1 MMOL/ML IV SOLN

[Series 3: FLAIR · sagittal · 3.0mm · 0.47mm/px · 2 of 44 slices shown (1 of 2)]
[im 1/44]
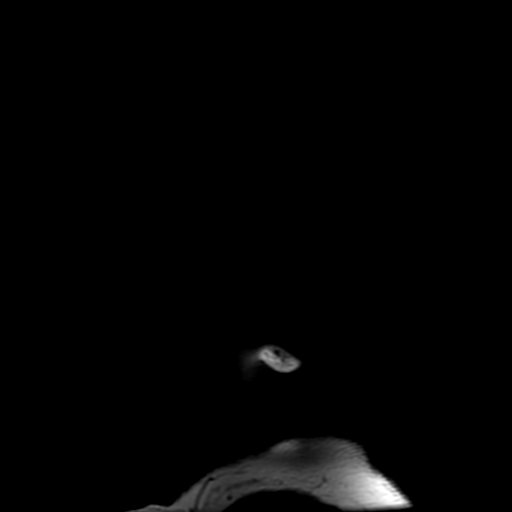
[im 44/44]
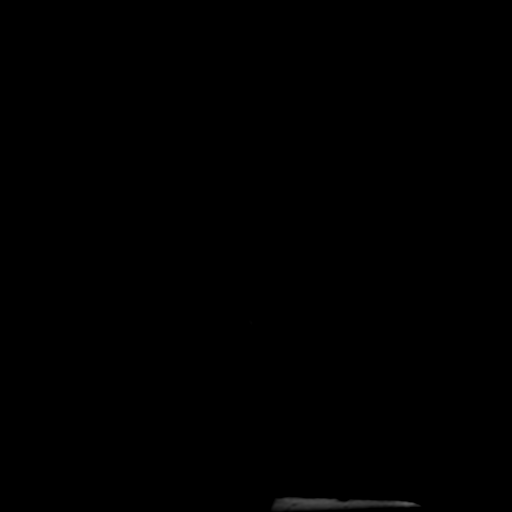

[Series 4: DWI · axial · 3.0mm · 0.94mm/px · z∈[-41,+120]mm · 3 of 108 slices shown]
[im 1/108]
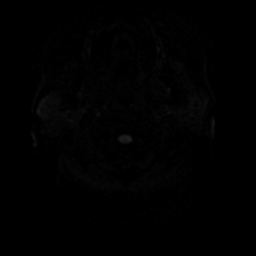
[im 54/108]
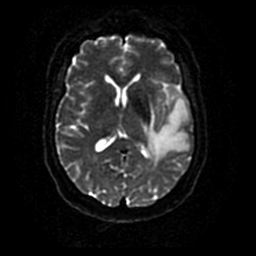
[im 108/108]
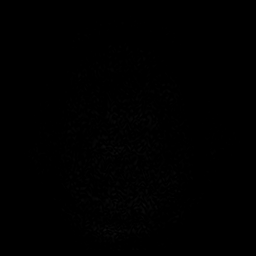

[Series 5: FLAIR · axial · 3.0mm · 0.47mm/px · z∈[-71,+115]mm · 2 of 63 slices shown (2 of 2)]
[im 1/63]
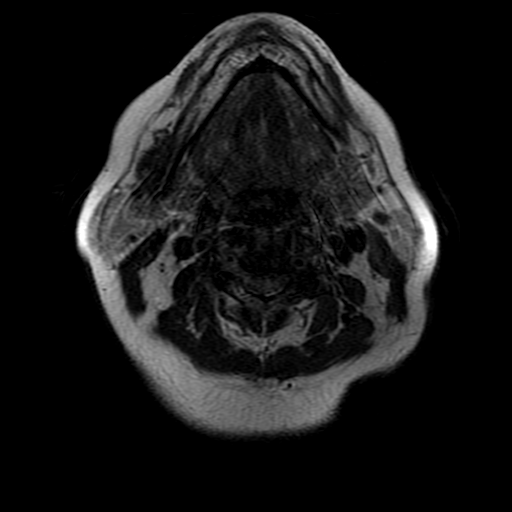
[im 63/63]
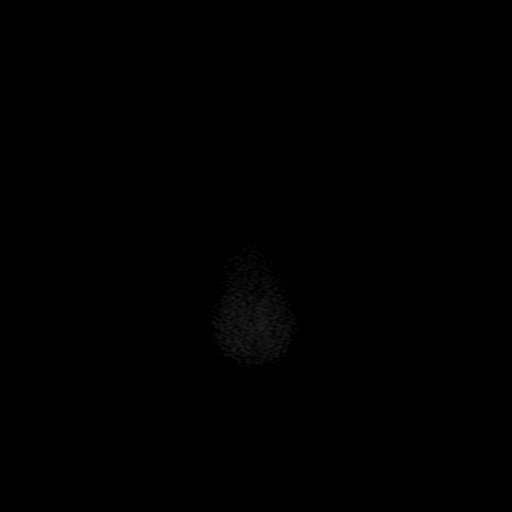

[Series 6: SWI · axial · 3.0mm · 0.47mm/px · z∈[-42,+43]mm · 2 of 116 slices shown]
[im 1/116]
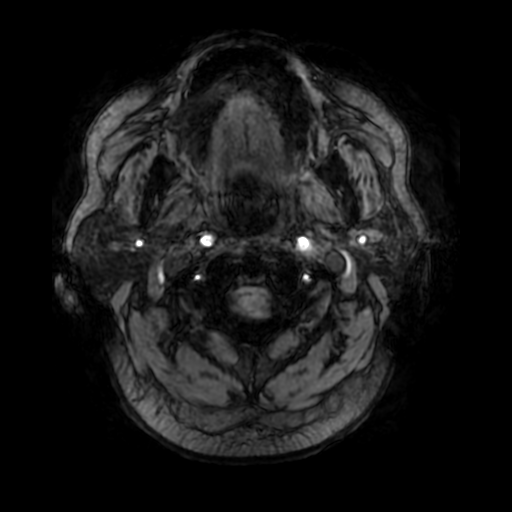
[im 58/116]
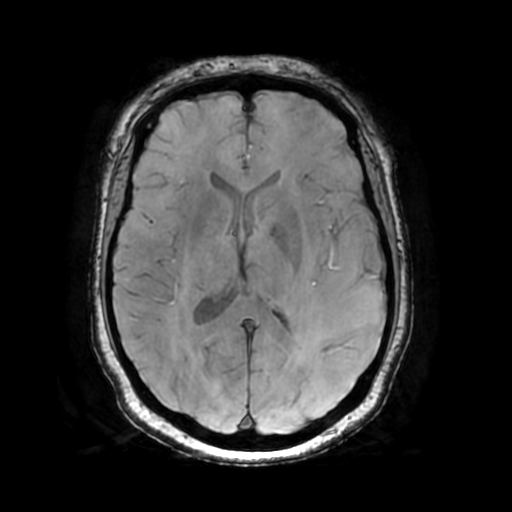

[Series 8: T2 post-contrast · coronal · 3.0mm · 0.39mm/px · 1 of 53 slices shown (1 of 2)]
[im 1/53]
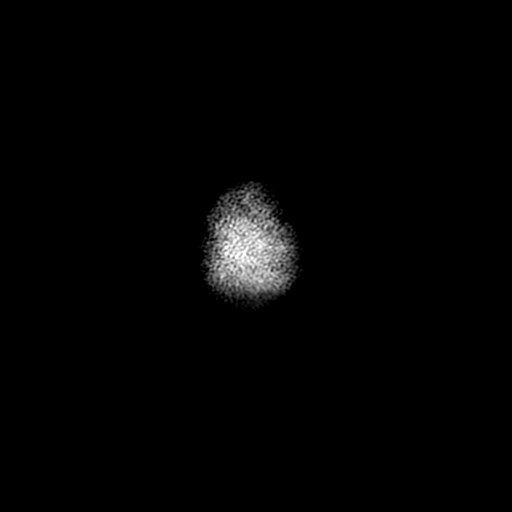

[Series 9: T2 post-contrast · axial · 5.0mm · 0.47mm/px · 1 of 31 slices shown (2 of 2)]
[im 1/31]
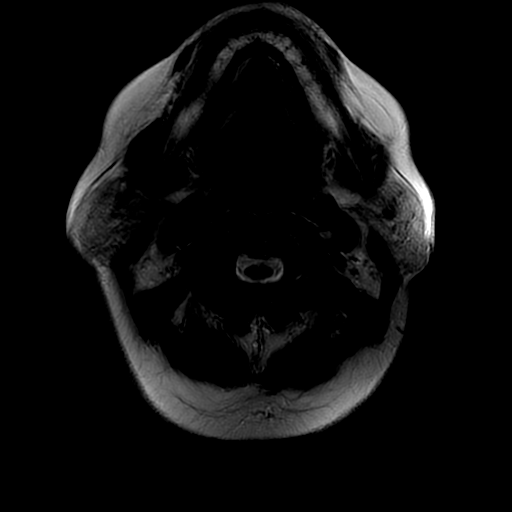

[Series 10: T1 post-contrast · coronal · 3.0mm · 0.39mm/px · 1 of 53 slices shown (1 of 2)]
[im 1/53]
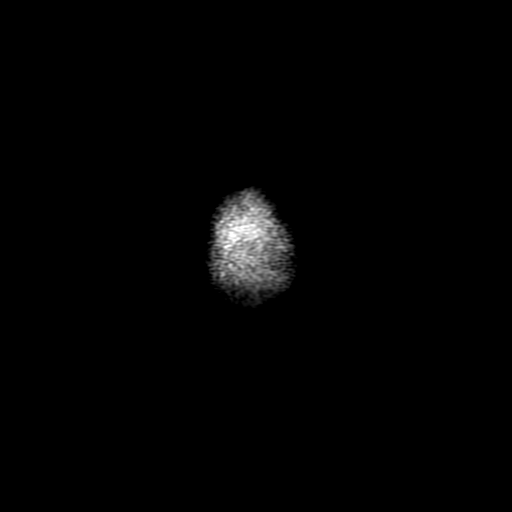

[Series 11: FLAIR post-contrast · sagittal · 3.0mm · 0.47mm/px · 1 of 44 slices shown]
[im 1/44]
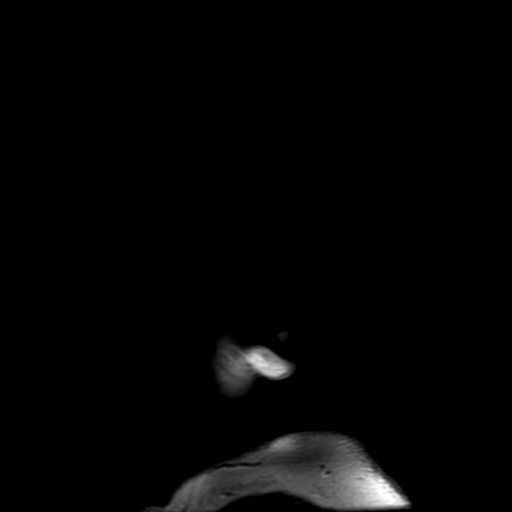

[Series 450: ADC · axial · 3.0mm · 0.94mm/px · 1 of 55 slices shown]
[im 1/55]
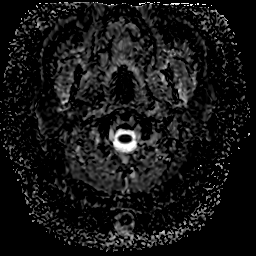

[Series 1200: T1 post-contrast · axial · 0.9mm · 0.50mm/px · z∈[-129,+125]mm · 7 of 301 slices shown (2 of 2)]
[im 1/301]
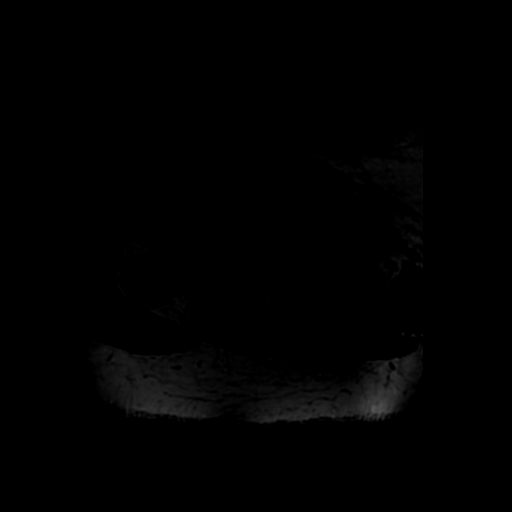
[im 51/301]
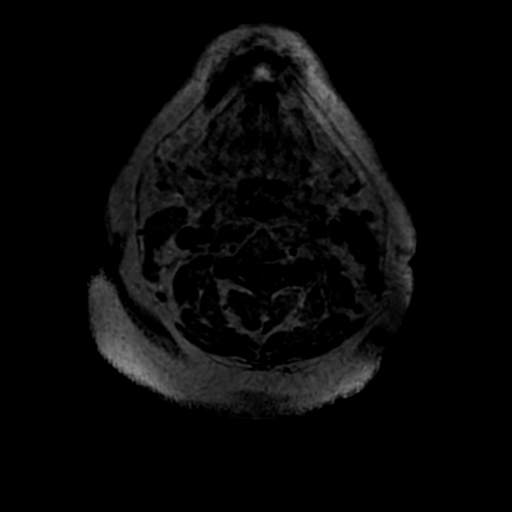
[im 101/301]
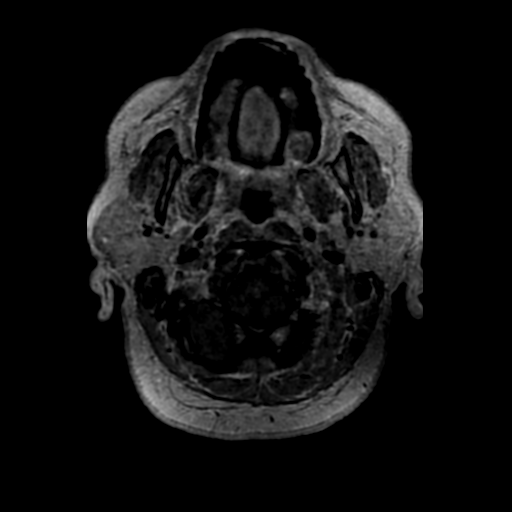
[im 151/301]
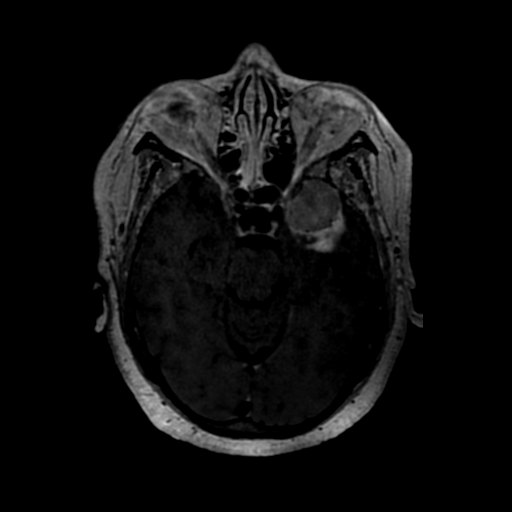
[im 201/301]
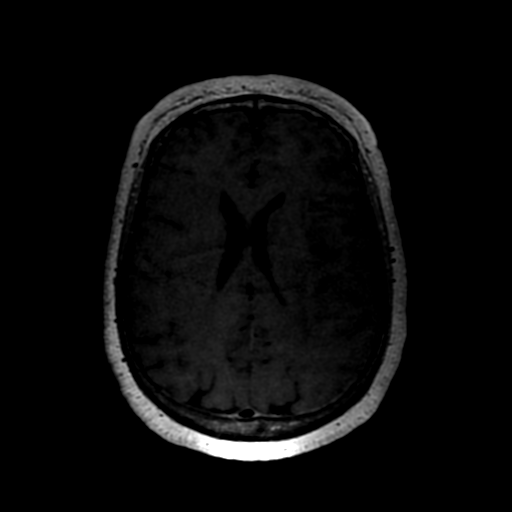
[im 251/301]
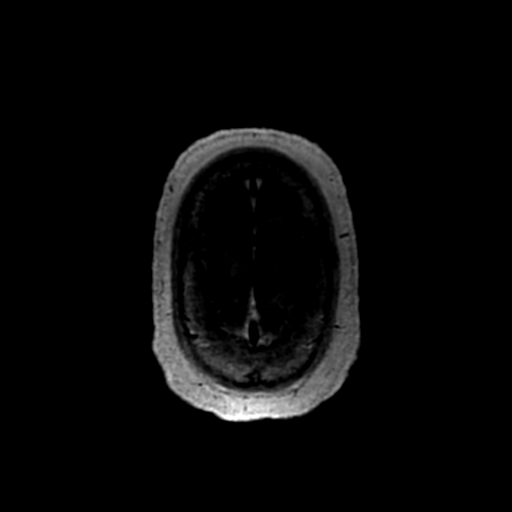
[im 301/301]
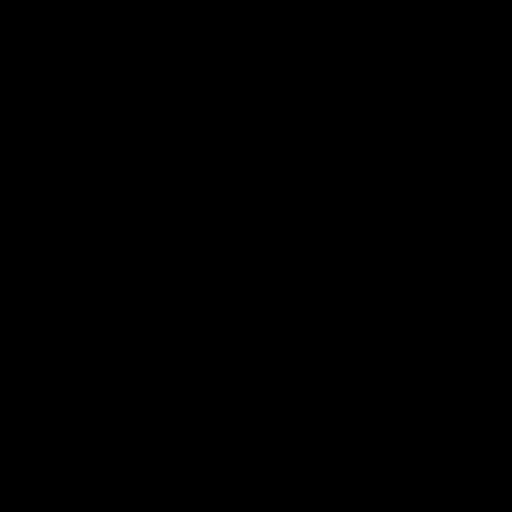

[21 of 48 positions shown; findings below may reference images not displayed]

FINDINGS: Motion artifact is present.

Brain: Meningioma is again identified along the left greater and
lesser sphenoid wings. Measures approximately 2.5 by 2.9 x 2.9 cm
(AP x TV x CC). The size is not substantially changed when allowing
for differences in measurement technique and slice angulation. There
is new enhancement in the underlying left temporal lobe parenchyma.

Edema is again noted in the underlying temporal lobe extending into
the inferior parietal lobe and along the central white matter tracks
to the periatrial white matter. This has increased since the prior
study. Mass effect is not substantially changed with medialization
of the left uncus, partial effacement of the left lateral ventricle,
and minimal rightward midline shift.

The two subcentimeter metastatic foci in the left frontal lobe have
resolved. No new mass.

No acute infarction. There is no hydrocephalus or extra-axial
collection. Unchanged foci of susceptibility hypointensity in the
left frontal subcortical white matter likely reflecting chronic
microhemorrhages.

Vascular: Major vessel flow voids at the skull base are preserved.

Skull and upper cervical spine: Normal marrow signal is preserved.

Sinuses/Orbits: Paranasal sinuses are aerated. Orbits are
unremarkable.

Other: Sella is unremarkable.  Mastoid air cells are clear.
IMPRESSION: Subcentimeter left frontal metastatic lesions are no longer
identified.

No substantial change in size of left sphenoid wing meningioma. New
underlying parenchymal enhancement and increased edema likely
related to radiation therapy. Regional mass effect remains similar.

## 2020-09-23 MED ORDER — GADOBUTROL 1 MMOL/ML IV SOLN
10.0000 mL | Freq: Once | INTRAVENOUS | Status: AC | PRN
  Administered 2020-09-23: 10 mL via INTRAVENOUS

## 2020-09-28 DIAGNOSIS — C3412 Malignant neoplasm of upper lobe, left bronchus or lung: Secondary | ICD-10-CM | POA: Diagnosis not present

## 2020-09-28 DIAGNOSIS — D329 Benign neoplasm of meninges, unspecified: Secondary | ICD-10-CM | POA: Diagnosis not present

## 2020-09-28 DIAGNOSIS — M170 Bilateral primary osteoarthritis of knee: Secondary | ICD-10-CM | POA: Diagnosis not present

## 2020-09-28 DIAGNOSIS — R7303 Prediabetes: Secondary | ICD-10-CM | POA: Diagnosis not present

## 2020-09-28 DIAGNOSIS — I2510 Atherosclerotic heart disease of native coronary artery without angina pectoris: Secondary | ICD-10-CM | POA: Diagnosis not present

## 2020-09-28 DIAGNOSIS — I119 Hypertensive heart disease without heart failure: Secondary | ICD-10-CM | POA: Diagnosis not present

## 2020-10-04 DIAGNOSIS — R030 Elevated blood-pressure reading, without diagnosis of hypertension: Secondary | ICD-10-CM | POA: Diagnosis not present

## 2020-10-04 DIAGNOSIS — Z6841 Body Mass Index (BMI) 40.0 and over, adult: Secondary | ICD-10-CM | POA: Diagnosis not present

## 2020-10-04 DIAGNOSIS — D329 Benign neoplasm of meninges, unspecified: Secondary | ICD-10-CM | POA: Diagnosis not present

## 2020-10-08 ENCOUNTER — Other Ambulatory Visit (HOSPITAL_COMMUNITY)
Admission: RE | Admit: 2020-10-08 | Discharge: 2020-10-08 | Disposition: A | Discharge status: Home / Self Care | Payer: Medicare Other | Source: Ambulatory Visit | Attending: Family Medicine | Admitting: Family Medicine

## 2020-10-08 DIAGNOSIS — R7303 Prediabetes: Secondary | ICD-10-CM | POA: Insufficient documentation

## 2020-10-08 LAB — COMPREHENSIVE METABOLIC PANEL
ALT: 16 U/L (ref 0–44)
AST: 18 U/L (ref 15–41)
Albumin: 3.9 g/dL (ref 3.5–5.0)
Alkaline Phosphatase: 94 U/L (ref 38–126)
Anion gap: 6 (ref 5–15)
BUN: 10 mg/dL (ref 8–23)
CO2: 26 mmol/L (ref 22–32)
Calcium: 8.9 mg/dL (ref 8.9–10.3)
Chloride: 109 mmol/L (ref 98–111)
Creatinine, Ser: 0.75 mg/dL (ref 0.44–1.00)
GFR, Estimated: >60 mL/min (ref >60)
Glucose, Bld: 112 mg/dL — ABNORMAL HIGH (ref 70–99)
Potassium: 4.2 mmol/L (ref 3.5–5.1)
Sodium: 141 mmol/L (ref 135–145)
Total Bilirubin: 0.5 mg/dL (ref 0.3–1.2)
Total Protein: 7.1 g/dL (ref 6.5–8.1)

## 2020-10-08 LAB — CBC
HCT: 41.8 % (ref 36.0–46.0)
Hemoglobin: 13 g/dL (ref 12.0–15.0)
MCH: 27.5 pg (ref 26.0–34.0)
MCHC: 31.1 g/dL (ref 30.0–36.0)
MCV: 88.6 fL (ref 80.0–100.0)
Platelets: 352 10*3/uL (ref 150–400)
RBC: 4.72 MIL/uL (ref 3.87–5.11)
RDW: 15.2 % (ref 11.5–15.5)
WBC: 4.8 10*3/uL (ref 4.0–10.5)
nRBC: 0 % (ref 0.0–0.2)

## 2020-10-08 LAB — LIPID PANEL
Cholesterol: 168 mg/dL (ref 0–200)
HDL: 67 mg/dL (ref >40)
LDL Cholesterol: 85 mg/dL (ref 0–99)
Total CHOL/HDL Ratio: 2.5 RATIO
Triglycerides: 82 mg/dL (ref <150)
VLDL: 16 mg/dL (ref 0–40)

## 2020-10-09 LAB — HEMOGLOBIN A1C
Hgb A1c MFr Bld: 6.4 % — ABNORMAL HIGH (ref 4.8–5.6)
Mean Plasma Glucose: 137 mg/dL

## 2020-10-13 ENCOUNTER — Other Ambulatory Visit: Payer: Self-pay | Admitting: Radiation Therapy

## 2020-11-19 ENCOUNTER — Other Ambulatory Visit: Payer: Self-pay | Admitting: Radiation Therapy

## 2020-11-19 DIAGNOSIS — D329 Benign neoplasm of meninges, unspecified: Secondary | ICD-10-CM

## 2020-12-15 ENCOUNTER — Ambulatory Visit (HOSPITAL_COMMUNITY)
Admission: RE | Admit: 2020-12-15 | Discharge: 2020-12-15 | Disposition: A | Discharge status: Home / Self Care | Payer: Medicare Other | Source: Ambulatory Visit | Attending: Hematology and Oncology | Admitting: Hematology and Oncology

## 2020-12-15 ENCOUNTER — Inpatient Hospital Stay (HOSPITAL_COMMUNITY): Payer: Medicare Other | Attending: Hematology

## 2020-12-15 ENCOUNTER — Other Ambulatory Visit: Payer: Self-pay

## 2020-12-15 DIAGNOSIS — C3492 Malignant neoplasm of unspecified part of left bronchus or lung: Secondary | ICD-10-CM | POA: Diagnosis not present

## 2020-12-15 DIAGNOSIS — I700 Atherosclerosis of aorta: Secondary | ICD-10-CM | POA: Diagnosis not present

## 2020-12-15 DIAGNOSIS — Z5189 Encounter for other specified aftercare: Secondary | ICD-10-CM | POA: Insufficient documentation

## 2020-12-15 DIAGNOSIS — R911 Solitary pulmonary nodule: Secondary | ICD-10-CM | POA: Diagnosis not present

## 2020-12-15 LAB — CBC WITH DIFFERENTIAL/PLATELET
Abs Immature Granulocytes: 0.01 10*3/uL (ref 0.00–0.07)
Basophils Absolute: 0 10*3/uL (ref 0.0–0.1)
Basophils Relative: 1 %
Eosinophils Absolute: 0.1 10*3/uL (ref 0.0–0.5)
Eosinophils Relative: 3 %
HCT: 41.8 % (ref 36.0–46.0)
Hemoglobin: 12.8 g/dL (ref 12.0–15.0)
Immature Granulocytes: 0 %
Lymphocytes Relative: 15 %
Lymphs Abs: 0.8 10*3/uL (ref 0.7–4.0)
MCH: 27.8 pg (ref 26.0–34.0)
MCHC: 30.6 g/dL (ref 30.0–36.0)
MCV: 90.7 fL (ref 80.0–100.0)
Monocytes Absolute: 0.5 10*3/uL (ref 0.1–1.0)
Monocytes Relative: 9 %
Neutro Abs: 3.9 10*3/uL (ref 1.7–7.7)
Neutrophils Relative %: 72 %
Platelets: 346 10*3/uL (ref 150–400)
RBC: 4.61 MIL/uL (ref 3.87–5.11)
RDW: 15.4 % (ref 11.5–15.5)
WBC: 5.3 10*3/uL (ref 4.0–10.5)
nRBC: 0 % (ref 0.0–0.2)

## 2020-12-15 LAB — COMPREHENSIVE METABOLIC PANEL
ALT: 17 U/L (ref 0–44)
AST: 18 U/L (ref 15–41)
Albumin: 3.7 g/dL (ref 3.5–5.0)
Alkaline Phosphatase: 93 U/L (ref 38–126)
Anion gap: 9 (ref 5–15)
BUN: 10 mg/dL (ref 8–23)
CO2: 27 mmol/L (ref 22–32)
Calcium: 9.1 mg/dL (ref 8.9–10.3)
Chloride: 106 mmol/L (ref 98–111)
Creatinine, Ser: 0.7 mg/dL (ref 0.44–1.00)
GFR, Estimated: >60 mL/min (ref >60)
Glucose, Bld: 117 mg/dL — ABNORMAL HIGH (ref 70–99)
Potassium: 4.3 mmol/L (ref 3.5–5.1)
Sodium: 142 mmol/L (ref 135–145)
Total Bilirubin: 0.3 mg/dL (ref 0.3–1.2)
Total Protein: 6.8 g/dL (ref 6.5–8.1)

## 2020-12-15 IMAGING — CT CT CHEST W/ CM
2 of 3 series · 14 of 36 positions shown, 17 images · IV contrast (Omnipaque or Isovue)
Comparison: Chest CT [DATE].

CLINICAL DATA: 70-year-old female with history of non-small cell
lung cancer, status post chemotherapy and radiation therapy in [CZ],
currently on immunotherapy. Follow-up study.

EXAM:
CT CHEST WITH CONTRAST
TECHNIQUE: Multidetector CT imaging of the chest was performed during
intravenous contrast administration.
CONTRAST:  75mL OMNIPAQUE IOHEXOL 300 MG/ML  SOLN

[Series 2: routine chest with · axial · 0.76mm/px · z∈[+1287,+1537]mm · 11 of 147 slices shown, 14 images]
[im 11/147  mediastinal]
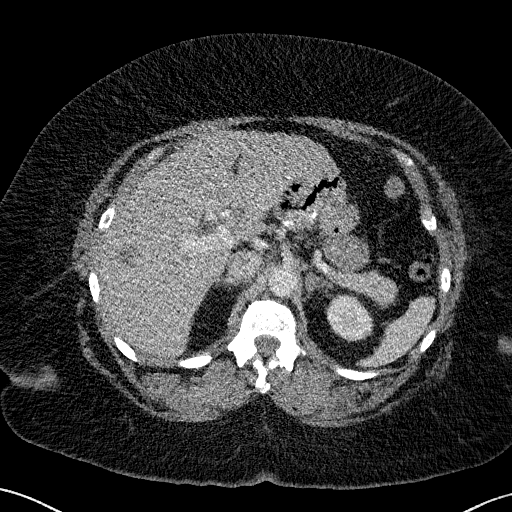
[im 11/147  lung]
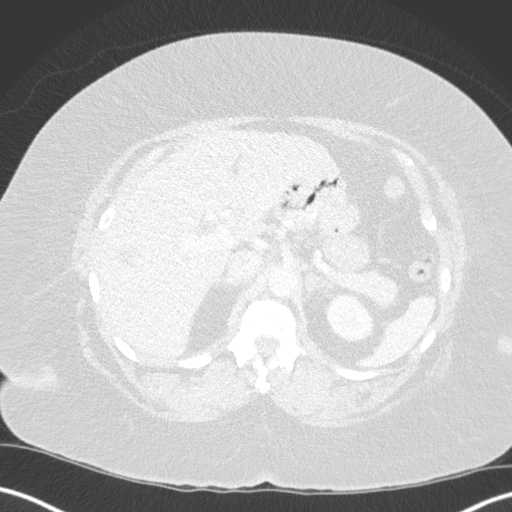
[im 22/147  lung]
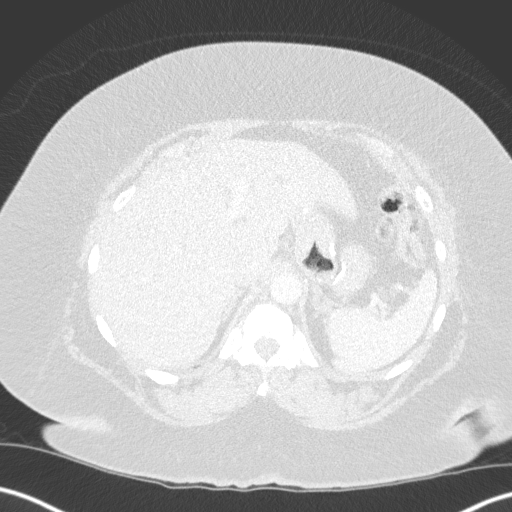
[im 33/147  lung]
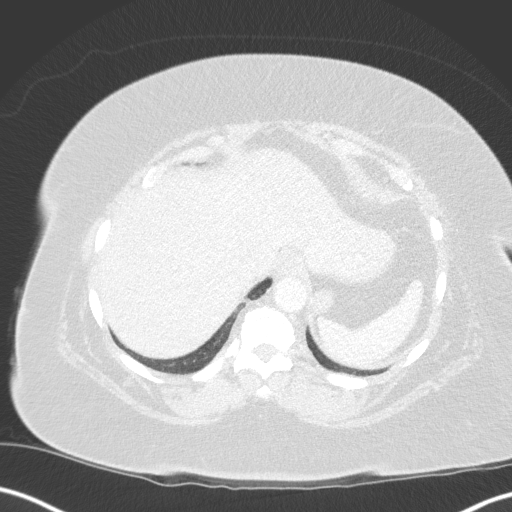
[im 49/147  lung]
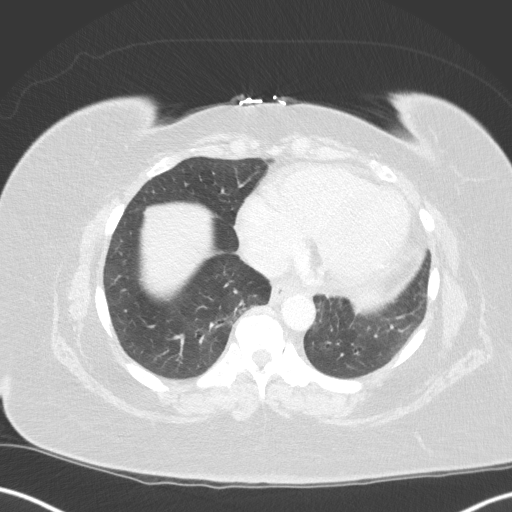
[im 60/147  mediastinal]
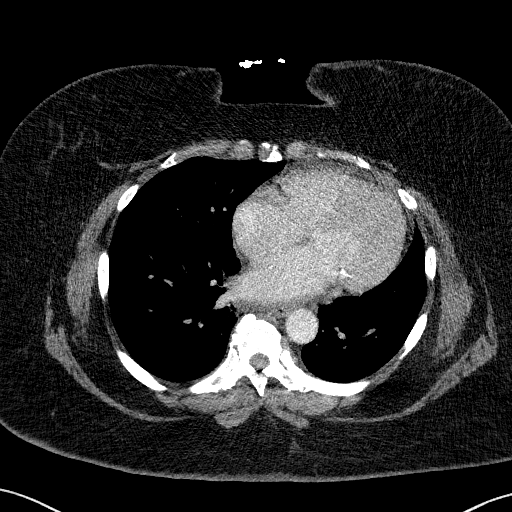
[im 60/147  lung]
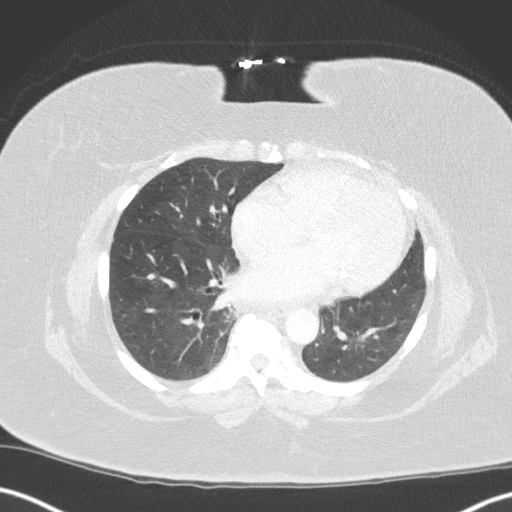
[im 76/147  lung]
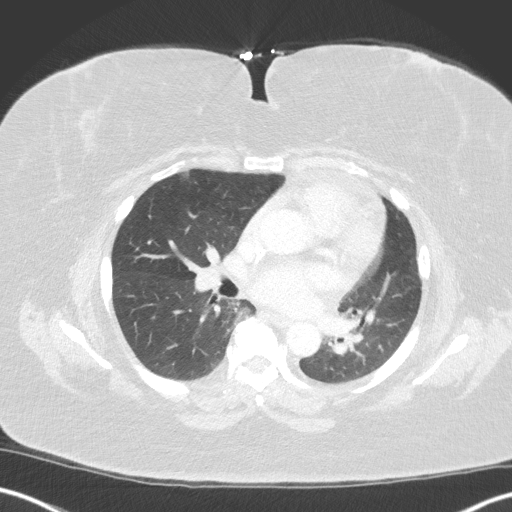
[im 87/147  lung]
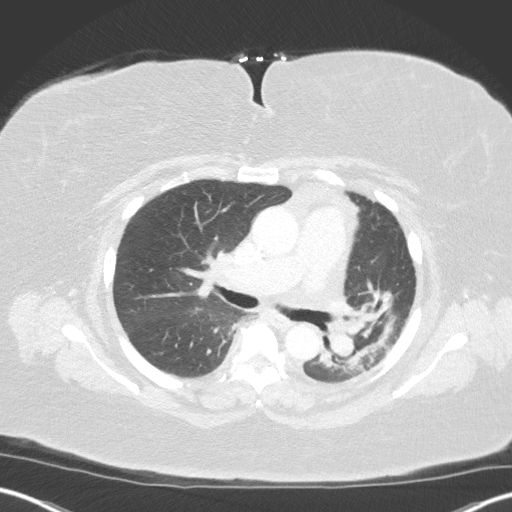
[im 98/147  lung]
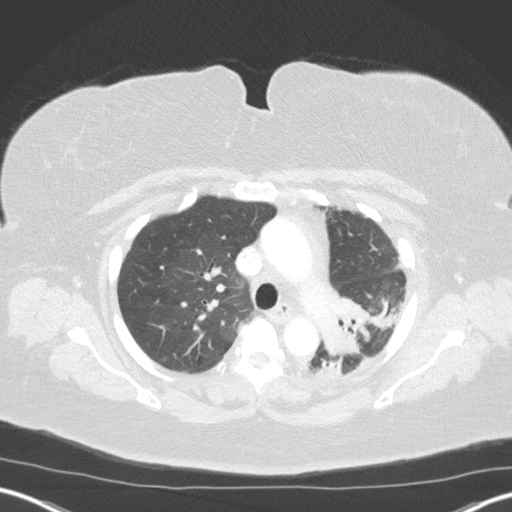
[im 114/147  mediastinal]
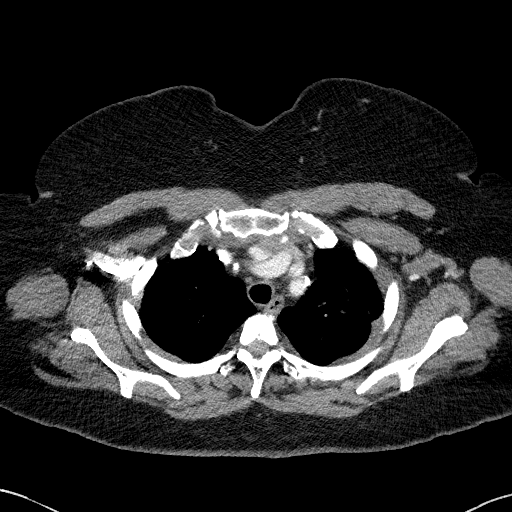
[im 114/147  lung]
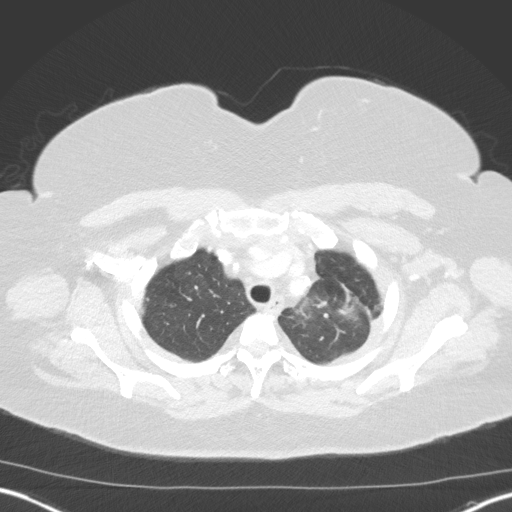
[im 125/147  lung]
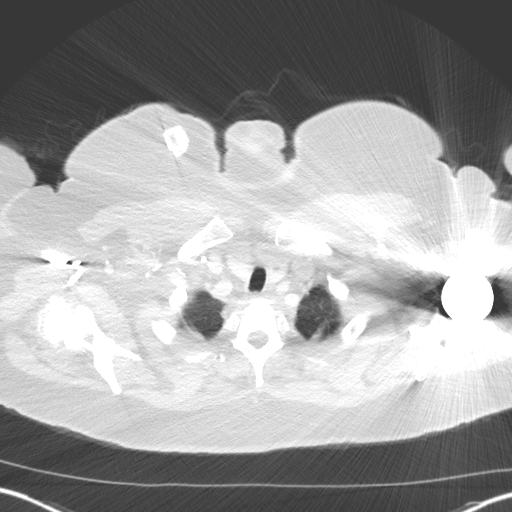
[im 136/147  lung]
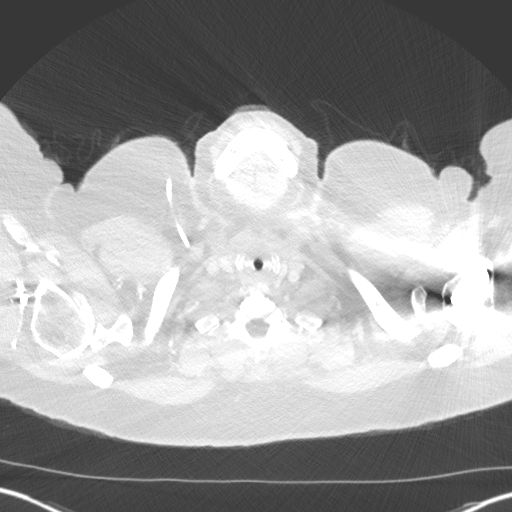

[Series 5: coronal · coronal · 0.61mm/px · 3 of 174 slices shown]
[im 35/174  lung]
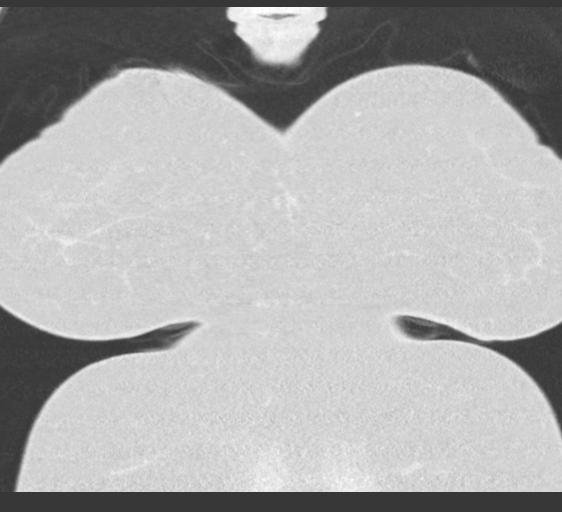
[im 70/174  lung]
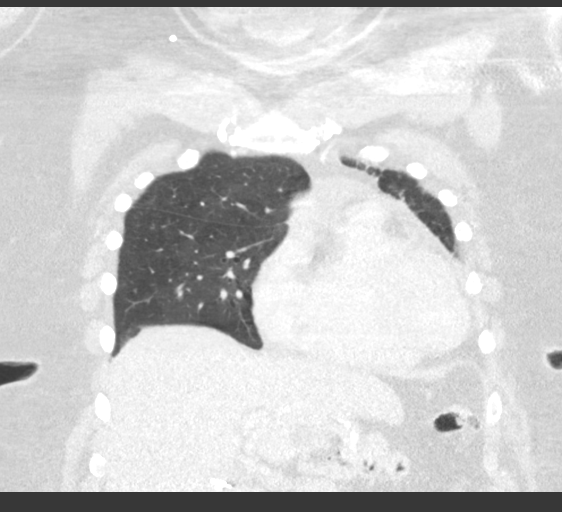
[im 104/174  lung]
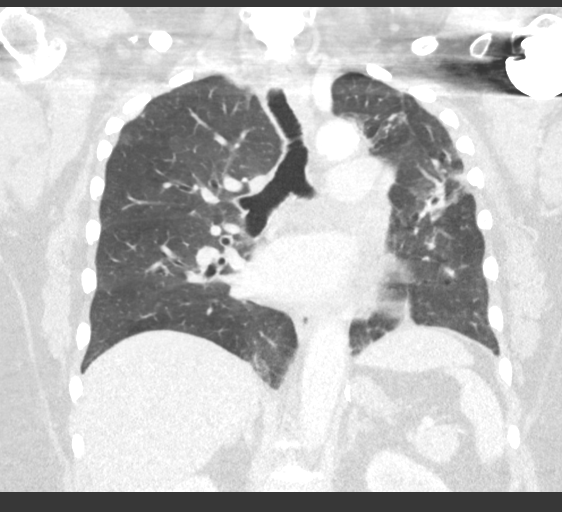

[14 of 36 positions shown; findings below may reference images not displayed]

FINDINGS: Cardiovascular: Heart size is No pathologically enlarged mediastinal
or hilar lymph nodes. Small amount of anterior pericardial fluid
and/or thickening, similar to the prior study and unlikely to be of
any hemodynamic significance at this time. No pericardial
calcification. There is aortic atherosclerosis, as well as
atherosclerosis of the great vessels of the mediastinum and the
coronary arteries, including calcified atherosclerotic plaque in the
left anterior descending, left circumflex and right coronary
arteries. Severe calcifications of the aortic valve. Mild
calcifications of the mitral annulus. Right internal jugular
single-lumen porta cath with tip terminating at the superior
cavoatrial junction.

Mediastinum/Nodes: No pathologically enlarged mediastinal or hilar
lymph nodes. Esophagus is unremarkable in appearance. No axillary
lymphadenopathy.

Lungs/Pleura: Extensive chronic volume loss and mass-like
architectural distortion is again noted in the left mid to upper
lung, very similar to the prior examination, most compatible with an
area of chronic postradiation masslike fibrosis. In the right lung
there are 2 new ground-glass attenuation nodules measuring 1.5 x
cm in the anterior aspect of the right middle lobe (axial image 74
of series 4), and 1.7 x 2.2 cm (axial image 42 of series 4) in the
periphery of the right upper lobe near the apex. No other definite
suspicious appearing pulmonary nodules or masses are noted. No acute
consolidative airspace disease. No pleural effusions.

Upper Abdomen: Postoperative changes of prior Roux-en-Y gastric
bypass. Status post cholecystectomy. Low-attenuation lesions in the
liver, similar to the prior study, largest of which is in the left
lobe between segments 2 and 3, measuring 1.9 x 1.5 cm, compatible
with simple cysts.

Musculoskeletal: There are no aggressive appearing lytic or blastic
lesions noted in the visualized portions of the skeleton.
IMPRESSION: 1. Stable chronic postradiation mass-like fibrosis in the left mid
to upper lung. No findings to suggest locally recurrent disease, and
no metastatic lymphadenopathy noted in the thorax.
2. New ground-glass attenuation nodules in the right lung, as above.
These are nonspecific and could be infectious or inflammatory in
etiology. Attention on follow-up studies is recommended to ensure
the stability or resolution of these findings.
3. Aortic atherosclerosis, in addition to 3 vessel coronary artery
disease. Please note that although the presence of coronary artery
calcium documents the presence of coronary artery disease, the
severity of this disease and any potential stenosis cannot be
assessed on this non-gated CT examination. Assessment for potential
risk factor modification, dietary therapy or pharmacologic therapy
may be warranted, if clinically indicated.
4. There are calcifications of the aortic valve and mitral annulus.
Echocardiographic correlation for evaluation of potential valvular
dysfunction may be warranted if clinically indicated.
5. Additional incidental findings, as above.

Aortic Atherosclerosis ([CZ]-[CZ]).

## 2020-12-15 MED ORDER — IOHEXOL 300 MG/ML  SOLN
75.0000 mL | Freq: Once | INTRAMUSCULAR | Status: AC | PRN
  Administered 2020-12-15: 75 mL via INTRAVENOUS

## 2020-12-18 NOTE — Progress Notes (Signed)
[place]  [address]   CLINIC:  Medical Oncology/Hematology  PCP:  Denise [name], [address] / [place] [address] [phone]   REASON FOR VISIT:  Follow-up for stage III left lung adenocarcinoma  PRIOR THERAPY:  1. Chemoradiation with carboplatin and paclitaxel from 01/07/2019 to 02/11/2019. 2. Consolidation with durvalumab from 03/19/2019 to 04/16/2019, held due to pneumonitis.  NGS Results: not done  CURRENT THERAPY: Consolidation with durvalumab every 2 weeks; brain mets SRS in 5 fractions  BRIEF ONCOLOGIC HISTORY:  Oncology History  Adenocarcinoma of left lung ([place])  12/09/2018 Initial Diagnosis   Adenocarcinoma of left lung ([place])   01/02/2019 Cancer Staging   Staging form: Lung, AJCC 8th Edition - Clinical stage from 01/02/2019: Stage IIIB (cT3, cN2, cM0) - Signed by Derek Jack, MD on 01/02/2019   01/07/2019 - 02/11/2019 Chemotherapy   The patient had palonosetron (ALOXI) injection 0.25 mg, 0.25 mg, Intravenous,  Once, 6 of 6 cycles Administration: 0.25 mg (01/07/2019), 0.25 mg (01/14/2019), 0.25 mg (01/21/2019), 0.25 mg (01/28/2019), 0.25 mg (02/04/2019), 0.25 mg (02/11/2019) CARBOplatin (PARAPLATIN) 270 mg in sodium chloride 0.9 % 250 mL chemo infusion, 270 mg (100 % of original dose 266.4 mg), Intravenous,  Once, 6 of 6 cycles Dose modification:   (original dose 266.4 mg, Cycle 1),   (original dose 266.4 mg, Cycle 2),   (original dose 266.4 mg, Cycle 3),   (original dose 266.4 mg, Cycle 4) Administration: 270 mg (01/07/2019), 270 mg (01/14/2019), 270 mg (01/21/2019), 270 mg (01/28/2019), 270 mg (02/04/2019), 270 mg (02/11/2019) PACLitaxel (TAXOL) 108 mg in sodium chloride 0.9 % 250 mL chemo infusion (</= 80mg /m2), 45 mg/m2 = 108 mg, Intravenous,  Once, 6 of 6 cycles Administration: 108 mg (01/07/2019), 108 mg (01/14/2019), 108 mg (01/21/2019), 108 mg (01/28/2019), 108 mg (02/04/2019), 108 mg  (02/11/2019) fosaprepitant (EMEND) 150 mg, dexamethasone (DECADRON) 12 mg in sodium chloride 0.9 % 145 mL IVPB, , Intravenous,  Once, 5 of 5 cycles Administration:  (01/14/2019),  (01/21/2019),  (01/28/2019),  (02/04/2019),  (02/11/2019)   for chemotherapy treatment.     03/19/2019 -  Chemotherapy    Patient is on Treatment Plan: LUNG DURVALUMAB Q14D         CANCER STAGING: Cancer Staging Adenocarcinoma of left lung [facility]) Staging form: Lung, AJCC 8th Edition - Clinical stage from 01/02/2019: Stage IIIB (cT3, cN2, cM0) - Signed by Derek Jack, MD on 01/02/2019   INTERVAL HISTORY:  [name], a 70 y.o. female, returns for routine follow-up of her  stage III left lung adenocarcinoma. [name] was last seen on 07/08/2020.   Today she reports feeling good. She reports right knee pain and SOB upon exertion. She denies any recent infections, skin rash, nausea, and vomiting. She reports cough in the morning productive of sputum and frequent urination.   REVIEW OF SYSTEMS:  Review of Systems  Constitutional:  Negative for appetite change and fatigue.  Respiratory:  Positive for cough and shortness of breath.   Gastrointestinal:  Negative for nausea and vomiting.  Genitourinary:  Positive for frequency.   Musculoskeletal:  Positive for arthralgias (6/10 R knee).  Skin:  Negative for rash.  All other systems reviewed and are negative.  PAST MEDICAL/SURGICAL HISTORY:  Past Medical History:  Diagnosis Date   Anemia    Aortic stenosis    Arthritis    Cancer (HCC)    Lung   Coronary artery calcification seen on CT scan  Essential hypertension    GERD (gastroesophageal reflux disease)    History of lung cancer    Stage III adenocarcinoma status post chemoradiation   Port-A-Cath in place 01/06/2019   Type 2 diabetes mellitus [facility])    Past Surgical History:  Procedure Laterality Date   CHOLECYSTECTOMY  1997   COLONOSCOPY     GASTRIC BYPASS     INCISIONAL HERNIA  REPAIR  04/11/11   IR IMAGING GUIDED PORT INSERTION  12/27/2018   Right   LAPAROSCOPIC SALPINGOOPHERECTOMY     LAPAROTOMY  04/11/2011   Procedure: EXPLORATORY LAPAROTOMY;  Surgeon: Joyice Faster. Cornett, MD;  Location: [facility];  Service: General;  Laterality: N/A;  closure port hole   RIGHT/LEFT HEART CATH AND CORONARY ANGIOGRAPHY N/A 12/29/2019   Procedure: RIGHT/LEFT HEART CATH AND CORONARY ANGIOGRAPHY;  Surgeon: Troy Sine, MD;  Location: [facility];  Service: Cardiovascular;  Laterality: N/A;   TOTAL HIP ARTHROPLASTY  03/07/2012   Procedure: TOTAL HIP ARTHROPLASTY ANTERIOR APPROACH;  Surgeon: Mauri Pole, MD;  Location: [facility];  Service: Orthopedics;  Laterality: Right;   TOTAL SHOULDER ARTHROPLASTY Left 01/21/2015   TOTAL SHOULDER ARTHROPLASTY Left 01/21/2015   Procedure: LEFT TOTAL SHOULDER ARTHROPLASTY;  Surgeon: Justice Britain, MD;  Location: [facility];  Service: Orthopedics;  Laterality: Left;   VAGINAL HYSTERECTOMY      SOCIAL HISTORY:  Social History   Socioeconomic History   Marital status: Single    Spouse name: Not on file   Number of children: Not on file   Years of education: 12th grade   Highest education level: Not on file  Occupational History   Occupation: Employed    Employer: [employer]  Tobacco Use   Smoking status: Former    Packs/day: 1.00    Years: 12.00    Pack years: 12.00    Types: Cigarettes    Quit date: 03/20/1976    Years since quitting: 44.7   Smokeless tobacco: Never  Vaping Use   Vaping Use: Never used  Substance and Sexual Activity   Alcohol use: No   Drug use: No   Sexual activity: Not Currently  Other Topics Concern   Not on file  Social History Narrative   Not on file   Social Determinants of Health   Financial Resource Strain: Not on file  Food Insecurity: Not on file  Transportation Needs: Not on file  Physical Activity: Not on file  Stress: Not on file  Social Connections: Not on file  Intimate Partner  Violence: Not on file    FAMILY HISTORY:  Family History  Problem Relation Age of Onset   Breast cancer Mother    COPD Mother    Arthritis Mother    Diabetes Mother    Hypertension Mother    Hypertension Father    Diabetes Father    Breast cancer Sister    Thyroid cancer Brother    Huntington's disease Maternal Grandmother    Heart attack Brother     CURRENT MEDICATIONS:  Current Outpatient Medications  Medication Sig Dispense Refill   Acetaminophen (TYLENOL 8 HOUR ARTHRITIS PAIN PO) Take 650 ng by mouth 2 (two) times daily.      albuterol (VENTOLIN HFA) 108 (90 Base) MCG/ACT inhaler TAKE 2 PUFFS BY MOUTH EVERY 6 HOURS AS NEEDED FOR WHEEZE OR SHORTNESS OF BREATH 18 each 6   amLODipine (NORVASC) 5 MG tablet Take 5 mg by mouth daily.      aspirin EC 81 MG tablet  Take 81 mg by mouth daily. Swallow whole.     Calcium Carbonate Antacid (TUMS PO) Take 4 tablets by mouth daily.     carboxymethylcellul-glycerin (OPTIVE) 0.5-0.9 % ophthalmic solution Place 1-2 drops into both eyes 3 (three) times daily as needed (dry/irritated eyes.).     carvedilol (COREG) 3.125 MG tablet TAKE 1 TABLET BY MOUTH 2 TIMES DAILY. 180 tablet 2   Cyanocobalamin (VITAMIN B-12) 2500 MCG SUBL Place 2,500 mcg under the tongue every morning.      diphenhydrAMINE (BENADRYL) 25 MG tablet Take 25 mg by mouth every 6 (six) hours as needed for itching or allergies.     DURVALUMAB IV Inject into the vein every 14 (fourteen) days.     ferrous sulfate 325 (65 FE) MG tablet Take 325 mg by mouth every evening.     furosemide (LASIX) 20 MG tablet TAKE 1 TABLET BY MOUTH AS NEEDED FOR FLUID OR EDEMA (DAILY AS NEEDED FOR SWELLING). 90 tablet 1   gabapentin (NEURONTIN) 300 MG capsule Take by mouth.     HYDROcodone-acetaminophen (NORCO/VICODIN) 5-325 MG tablet Take 1 tablet by mouth 2 (two) times daily as needed.     lidocaine-prilocaine (EMLA) cream APPLY SMALL AMOUNT TO PORT A CATH SITE AND COVER WITH PLASTIC WRAP 1 HR PRIOR TO  CHEMOTHERAPY APPTS 30 g 3   Multiple Vitamin (MULITIVITAMIN WITH MINERALS) TABS Take 1 tablet by mouth daily.     naproxen (NAPROSYN) 500 MG tablet Take 500 mg by mouth every 12 (twelve) hours.      omeprazole (PRILOSEC) 20 MG capsule Take 20 mg by mouth daily.     Current Facility-Administered Medications  Medication Dose Route Frequency Provider Last Rate Last Admin   sodium chloride flush (NS) 0.9 % injection 3 mL  3 mL Intravenous Q12H Satira Sark, MD        ALLERGIES:  No Known Allergies  PHYSICAL EXAM:  Performance status (ECOG): 1 - Symptomatic but completely ambulatory  There were no vitals filed for this visit. Wt Readings from Last 3 Encounters:  09/16/20 278 lb 12.8 oz (126.5 kg)  07/21/20 284 lb 6.4 oz (129 kg)  07/08/20 285 lb 7.9 oz (129.5 kg)   Physical Exam Vitals reviewed.  Constitutional:      Appearance: Normal appearance.  Cardiovascular:     Rate and Rhythm: Normal rate and regular rhythm.     Pulses: Normal pulses.     Heart sounds: Murmur heard.  Systolic murmur is present.  Pulmonary:     Effort: Pulmonary effort is normal.     Breath sounds: Normal breath sounds.  Neurological:     General: No focal deficit present.     Mental Status: She is alert and oriented to person, place, and time.  Psychiatric:        Mood and Affect: Mood normal.        Behavior: Behavior normal.     LABORATORY DATA:  I have reviewed the labs as listed.  CBC Latest Ref Rng & Units 12/15/2020 10/08/2020 08/30/2020  WBC 4.0 - 10.5 K/uL 5.3 4.8 4.4  Hemoglobin 12.0 - 15.0 g/dL 12.8 13.0 11.6(L)  Hematocrit 36.0 - 46.0 % 41.8 41.8 37.2  Platelets 150 - 400 K/uL 346 352 328   CMP Latest Ref Rng & Units 12/15/2020 10/08/2020 08/30/2020  Glucose 70 - 99 mg/dL 117(H) 112(H) 103(H)  BUN 8 - 23 mg/dL 10 10 15   Creatinine 0.44 - 1.00 mg/dL 0.70 0.75 0.66  Sodium 135 -  145 mmol/L 142 141 141  Potassium 3.5 - 5.1 mmol/L 4.3 4.2 3.9  Chloride 98 - 111 mmol/L 106 109 109   CO2 22 - 32 mmol/L 27 26 26   Calcium 8.9 - 10.3 mg/dL 9.1 8.9 8.7(L)  Total Protein 6.5 - 8.1 g/dL 6.8 7.1 6.5  Total Bilirubin 0.3 - 1.2 mg/dL 0.3 0.5 0.3  Alkaline Phos 38 - 126 U/L 93 94 74  AST 15 - 41 U/L 18 18 18   ALT 0 - 44 U/L 17 16 18     DIAGNOSTIC IMAGING:  I have independently reviewed the scans and discussed with the patient. CT Chest W Contrast  Result Date: 12/16/2020 CLINICAL DATA:  70 year old female with history of non-small cell lung cancer, status post chemotherapy and radiation therapy in 2020, currently on immunotherapy. Follow-up study. EXAM: CT CHEST WITH CONTRAST TECHNIQUE: Multidetector CT imaging of the chest was performed during intravenous contrast administration. CONTRAST:  52mL OMNIPAQUE IOHEXOL 300 MG/ML  SOLN COMPARISON:  Chest CT 08/30/2020. FINDINGS: Cardiovascular: Heart size is No pathologically enlarged mediastinal or hilar lymph nodes. Small amount of anterior pericardial fluid and/or thickening, similar to the prior study and unlikely to be of any hemodynamic significance at this time. No pericardial calcification. There is aortic atherosclerosis, as well as atherosclerosis of the great vessels of the mediastinum and the coronary arteries, including calcified atherosclerotic plaque in the left anterior descending, left circumflex and right coronary arteries. Severe calcifications of the aortic valve. Mild calcifications of the mitral annulus. Right internal jugular single-lumen porta cath with tip terminating at the superior cavoatrial junction. Mediastinum/Nodes: No pathologically enlarged mediastinal or hilar lymph nodes. Esophagus is unremarkable in appearance. No axillary lymphadenopathy. Lungs/Pleura: Extensive chronic volume loss and mass-like architectural distortion is again noted in the left mid to upper lung, very similar to the prior examination, most compatible with an area of chronic postradiation masslike fibrosis. In the right lung there are 2 new  ground-glass attenuation nodules measuring 1.5 x 0.7 cm in the anterior aspect of the right middle lobe (axial image 74 of series 4), and 1.7 x 2.2 cm (axial image 42 of series 4) in the periphery of the right upper lobe near the apex. No other definite suspicious appearing pulmonary nodules or masses are noted. No acute consolidative airspace disease. No pleural effusions. Upper Abdomen: Postoperative changes of prior Roux-en-Y gastric bypass. Status post cholecystectomy. Low-attenuation lesions in the liver, similar to the prior study, largest of which is in the left lobe between segments 2 and 3, measuring 1.9 x 1.5 cm, compatible with simple cysts. Musculoskeletal: There are no aggressive appearing lytic or blastic lesions noted in the visualized portions of the skeleton. IMPRESSION: 1. Stable chronic postradiation mass-like fibrosis in the left mid to upper lung. No findings to suggest locally recurrent disease, and no metastatic lymphadenopathy noted in the thorax. 2. New ground-glass attenuation nodules in the right lung, as above. These are nonspecific and could be infectious or inflammatory in etiology. Attention on follow-up studies is recommended to ensure the stability or resolution of these findings. 3. Aortic atherosclerosis, in addition to 3 vessel coronary artery disease. Please note that although the presence of coronary artery calcium documents the presence of coronary artery disease, the severity of this disease and any potential stenosis cannot be assessed on this non-gated CT examination. Assessment for potential risk factor modification, dietary therapy or pharmacologic therapy may be warranted, if clinically indicated. 4. There are calcifications of the aortic valve and mitral annulus. Echocardiographic correlation  for evaluation of potential valvular dysfunction may be warranted if clinically indicated. 5. Additional incidental findings, as above. Aortic Atherosclerosis (ICD10-I70.0).  Electronically Signed   By: Vinnie Langton M.D.   On: 12/16/2020 07:04     ASSESSMENT:  1.  Adenocarcinoma of left lung (HCC) -Chemoradiation therapy with carboplatin and paclitaxel from 01/07/2019 through 02/11/2019. -Consolidation immunotherapy with durvalumab from 03/19/2019 through 04/15/2018, held due to pneumonitis. -CT chest on 07/02/2019 shows left upper lobe lung mass measuring 2.6 x 2.0 cm.  It shows improvement in size. -MRI of the brain on 07/18/2019 showed left sphenoid wing meningioma measuring 2.6 x 2.0 x 2.6 cm unchanged.  No new enhancing intracranial lesion.  Increased left temporal white matter edema. -Durvalumab restarted on 07/24/2019. -We reviewed CT chest with contrast from 09/29/2019 which showed stable appearance of treated lung lesion within the posterior left upper lobe.  New bandlike area of increased soft tissue density within the anterior to the treated lung lesion favored to be progressive changes from radiation.  Local tumor recurrence is less favored.  Stable pericardial effusion. -CT chest on 12/10/2019 shows posttreatment appearance of the left lung with associated scarring and volume loss.  No evidence of recurrence or metastatic disease.  Unchanged small pericardial effusion. -She was evaluated by cardiology with a stress test.  EF was 46%.  She underwent cardiac catheterization which did not show any abnormalities. - 1 year of durvalumab completed on 07/23/2020.   PLAN:  1.  Clinical stage IIIb (T3N2) adenocarcinoma of the lung: - She has been doing very well without any major problems.  No recent infections. - She denies any fevers or night sweats or weight loss. - We reviewed CT chest images from 12/15/2020 which showed chronic postradiation changes in the left lung.  New groundglass attenuation in the right lung, nonspecific most likely inflammatory. - She does not have any respiratory symptoms.  We will follow-up in 4 months with repeat scan.   2.  Shortness of  breath on exertion: - Continue inhalers and follow-up with pulmonary.   3.  Meningioma/brain mets: - MRI of the brain from 09/23/2020-subcentimeter left frontal metastatic lesions no longer identified.  No substantial change in the size of the left sphenoid wing meningioma.  Mild edema, likely from radiation therapy. - She reports another MRI coming up next week.   4.  Hypertension: - Continue Norvasc daily.   5.  Bilateral leg swelling: - Continue Lasix as needed.   6.  Bilateral knee pains, right more than left: - She has pain from bilateral osteoarthritis.  Continue to follow-up with Dr. Para March.   Orders placed this encounter:  No orders of the defined types were placed in this encounter.    Derek Jack, MD [facility] [phone]   I, Thana Ates, am acting as a scribe for Dr. Derek Jack.  I, Derek Jack MD, have reviewed the above documentation for accuracy and completeness, and I agree with the above.

## 2020-12-20 ENCOUNTER — Inpatient Hospital Stay (HOSPITAL_COMMUNITY): Payer: Medicare Other | Attending: Hematology | Admitting: Hematology

## 2020-12-20 ENCOUNTER — Other Ambulatory Visit: Payer: Self-pay

## 2020-12-20 VITALS — BP 134/55 | HR 83 | Temp 98.4°F | Resp 18 | Wt 272.4 lb

## 2020-12-20 DIAGNOSIS — I10 Essential (primary) hypertension: Secondary | ICD-10-CM | POA: Diagnosis not present

## 2020-12-20 DIAGNOSIS — Z23 Encounter for immunization: Secondary | ICD-10-CM | POA: Insufficient documentation

## 2020-12-20 DIAGNOSIS — I3139 Other pericardial effusion (noninflammatory): Secondary | ICD-10-CM | POA: Diagnosis not present

## 2020-12-20 DIAGNOSIS — C3412 Malignant neoplasm of upper lobe, left bronchus or lung: Secondary | ICD-10-CM | POA: Diagnosis not present

## 2020-12-20 DIAGNOSIS — M1990 Unspecified osteoarthritis, unspecified site: Secondary | ICD-10-CM | POA: Insufficient documentation

## 2020-12-20 DIAGNOSIS — C7931 Secondary malignant neoplasm of brain: Secondary | ICD-10-CM | POA: Diagnosis not present

## 2020-12-20 DIAGNOSIS — C3492 Malignant neoplasm of unspecified part of left bronchus or lung: Secondary | ICD-10-CM | POA: Diagnosis not present

## 2020-12-20 DIAGNOSIS — Z79899 Other long term (current) drug therapy: Secondary | ICD-10-CM | POA: Diagnosis not present

## 2020-12-20 MED ORDER — INFLUENZA VAC A&B SA ADJ QUAD 0.5 ML IM PRSY
0.5000 mL | PREFILLED_SYRINGE | Freq: Once | INTRAMUSCULAR | Status: AC
  Administered 2020-12-20: 0.5 mL via INTRAMUSCULAR
  Filled 2020-12-20: qty 0.5

## 2020-12-20 MED ORDER — INFLUENZA VAC A&B SA ADJ QUAD 0.5 ML IM PRSY: 0.5000 mL | PREFILLED_SYRINGE | Freq: Once | INTRAMUSCULAR | Status: DC

## 2020-12-20 NOTE — Progress Notes (Signed)
Patient here today for appointment with Dr.Katragadda. Flu vaccine ordered. See MAR of administration information. Patient remained stable throughout visit. Patient discharged ambulatory and in stable condition.

## 2020-12-20 NOTE — Patient Instructions (Addendum)
[place] at [facility] Discharge Instructions  You were seen today by Dr. Delton Coombes. He went over your recent results and scans. You will be scheduled for a CT scan of your chest prior to your next appointment. Dr. Delton Coombes will see you back in 4 months for labs and follow up.   Thank you for choosing [place] at [facility] to provide your oncology and hematology care.  To afford each patient quality time with our provider, please arrive at least 15 minutes before your scheduled appointment time.   If you have a lab appointment with the [facility] please come in thru the Main Entrance and check in at the main information desk  You need to re-schedule your appointment should you arrive 10 or more minutes late.  We strive to give you quality time with our providers, and arriving late affects you and other patients whose appointments are after yours.  Also, if you no show three or more times for appointments you may be dismissed from the clinic at the providers discretion.     Again, thank you for choosing [facility].  Our hope is that these requests will decrease the amount of time that you wait before being seen by our physicians.       _____________________________________________________________  Should you have questions after your visit to [facility], please contact our office at [phone] between the hours of 8:00 a.m. and 4:30 p.m.  Voicemails left after 4:00 p.m. will not be returned until the following business day.  For prescription refill requests, have your pharmacy contact our office and allow 72 hours.    Cancer Center Support Programs:   > Cancer Support Group  2nd Tuesday of the month 1pm-2pm, Journey Room

## 2020-12-29 ENCOUNTER — Other Ambulatory Visit (HOSPITAL_COMMUNITY): Payer: Self-pay | Admitting: Family Medicine

## 2020-12-29 DIAGNOSIS — Z1231 Encounter for screening mammogram for malignant neoplasm of breast: Secondary | ICD-10-CM

## 2020-12-29 DIAGNOSIS — Z124 Encounter for screening for malignant neoplasm of cervix: Secondary | ICD-10-CM | POA: Diagnosis not present

## 2020-12-31 ENCOUNTER — Ambulatory Visit (HOSPITAL_COMMUNITY)
Admission: RE | Admit: 2020-12-31 | Discharge: 2020-12-31 | Disposition: A | Discharge status: Home / Self Care | Payer: Medicare Other | Source: Ambulatory Visit | Attending: Radiation Oncology | Admitting: Radiation Oncology

## 2020-12-31 ENCOUNTER — Other Ambulatory Visit: Payer: Self-pay

## 2020-12-31 DIAGNOSIS — D329 Benign neoplasm of meninges, unspecified: Secondary | ICD-10-CM | POA: Insufficient documentation

## 2020-12-31 DIAGNOSIS — G936 Cerebral edema: Secondary | ICD-10-CM | POA: Diagnosis not present

## 2020-12-31 MED ORDER — GADOBUTROL 1 MMOL/ML IV SOLN
10.0000 mL | Freq: Once | INTRAVENOUS | Status: AC | PRN
  Administered 2020-12-31: 10 mL via INTRAVENOUS

## 2020-12-31 MED ORDER — HEPARIN SOD (PORK) LOCK FLUSH 100 UNIT/ML IV SOLN
500.0000 [IU] | INTRAVENOUS | Status: AC | PRN
  Administered 2020-12-31: 500 [IU]

## 2021-01-03 ENCOUNTER — Inpatient Hospital Stay: Payer: Medicare Other | Attending: Neurosurgery

## 2021-01-04 DIAGNOSIS — D329 Benign neoplasm of meninges, unspecified: Secondary | ICD-10-CM | POA: Diagnosis not present

## 2021-01-06 ENCOUNTER — Other Ambulatory Visit: Payer: Self-pay

## 2021-01-06 ENCOUNTER — Ambulatory Visit (HOSPITAL_COMMUNITY)
Admission: RE | Admit: 2021-01-06 | Discharge: 2021-01-06 | Disposition: A | Discharge status: Home / Self Care | Payer: Medicare Other | Source: Ambulatory Visit | Attending: Family Medicine | Admitting: Family Medicine

## 2021-01-06 ENCOUNTER — Encounter (HOSPITAL_COMMUNITY): Payer: Self-pay

## 2021-01-06 DIAGNOSIS — Z1231 Encounter for screening mammogram for malignant neoplasm of breast: Secondary | ICD-10-CM | POA: Insufficient documentation

## 2021-01-06 IMAGING — MG MM DIGITAL SCREENING BILAT W/ TOMO AND CAD
6 of 12 series · 6 of 36 positions shown · non-contrast
Comparison: Previous exam(s).

CLINICAL DATA: Screening.

EXAM:
DIGITAL SCREENING BILATERAL MAMMOGRAM WITH TOMOSYNTHESIS AND CAD
TECHNIQUE: Bilateral screening digital craniocaudal and mediolateral oblique
mammograms were obtained. Bilateral screening digital breast
tomosynthesis was performed. The images were evaluated with
computer-aided detection.

[R MLO synth-2D (1 of 2)]
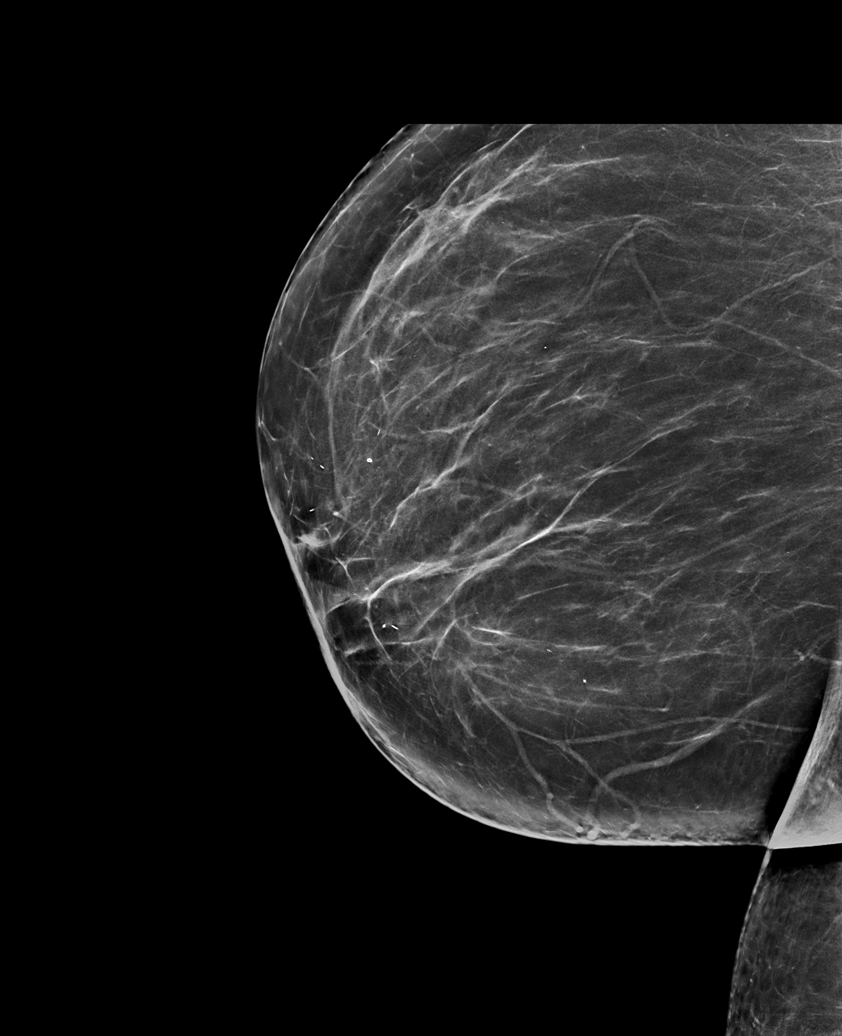

[R CC synth-2D]
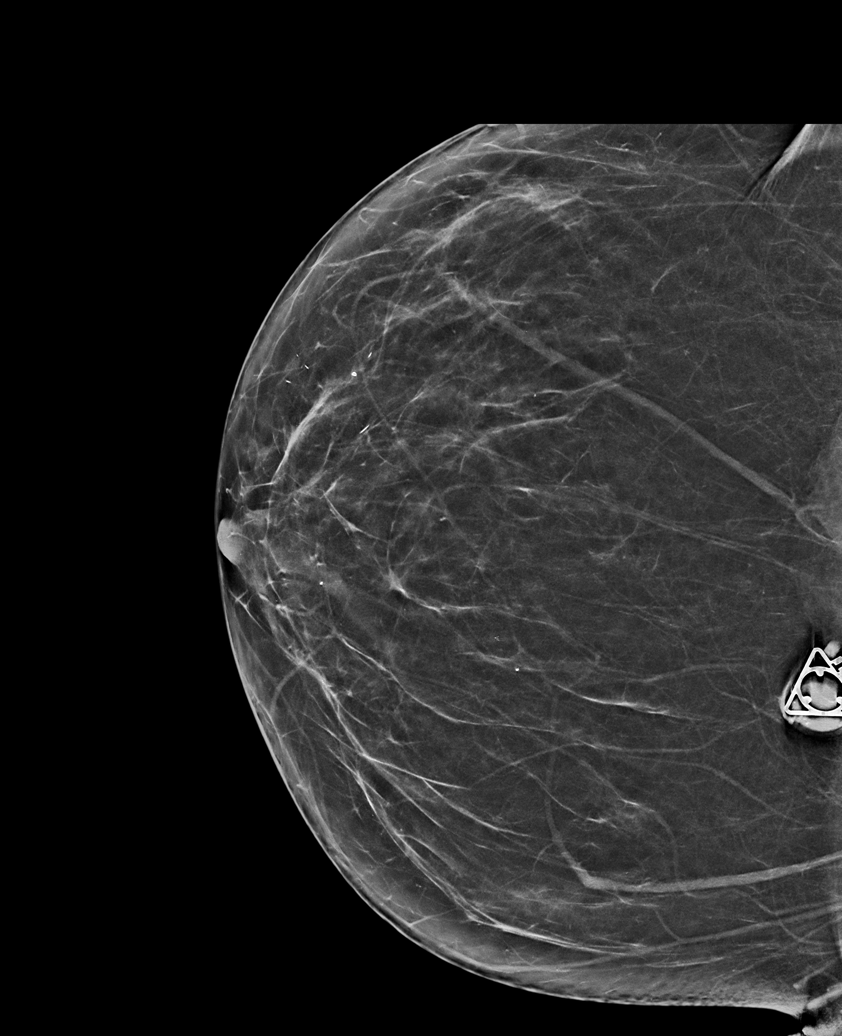

[L CV synth-2D]
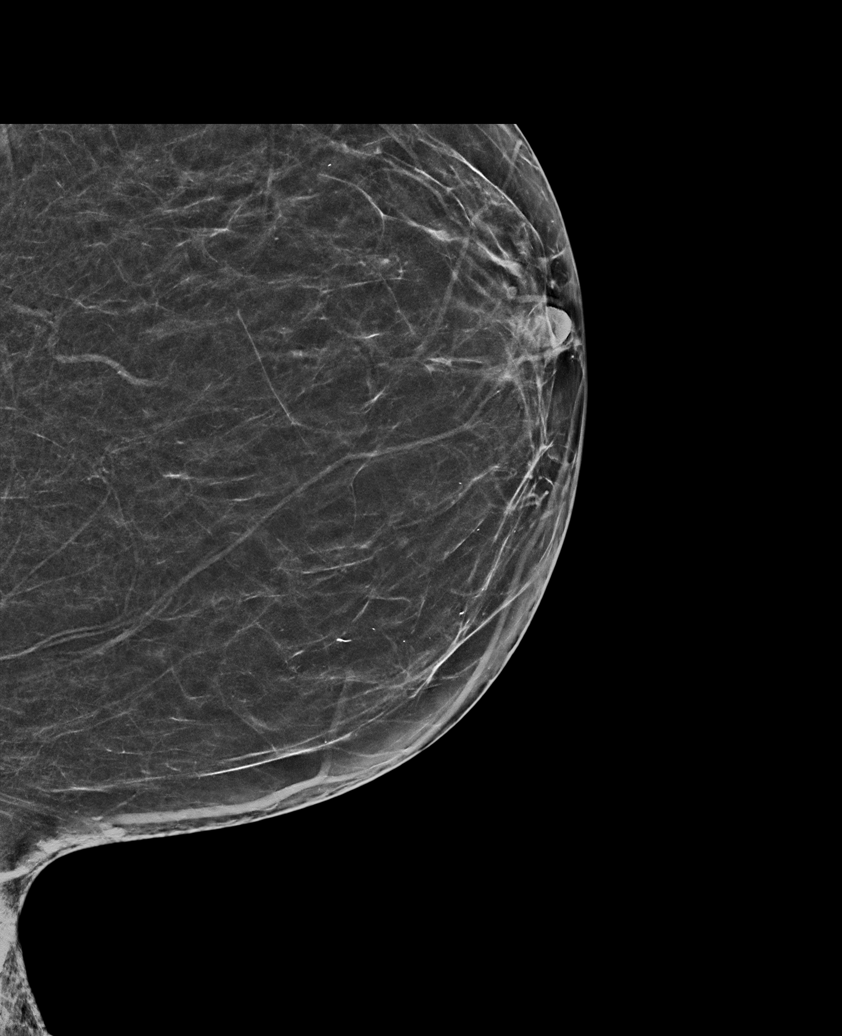

[L MLO synth-2D]
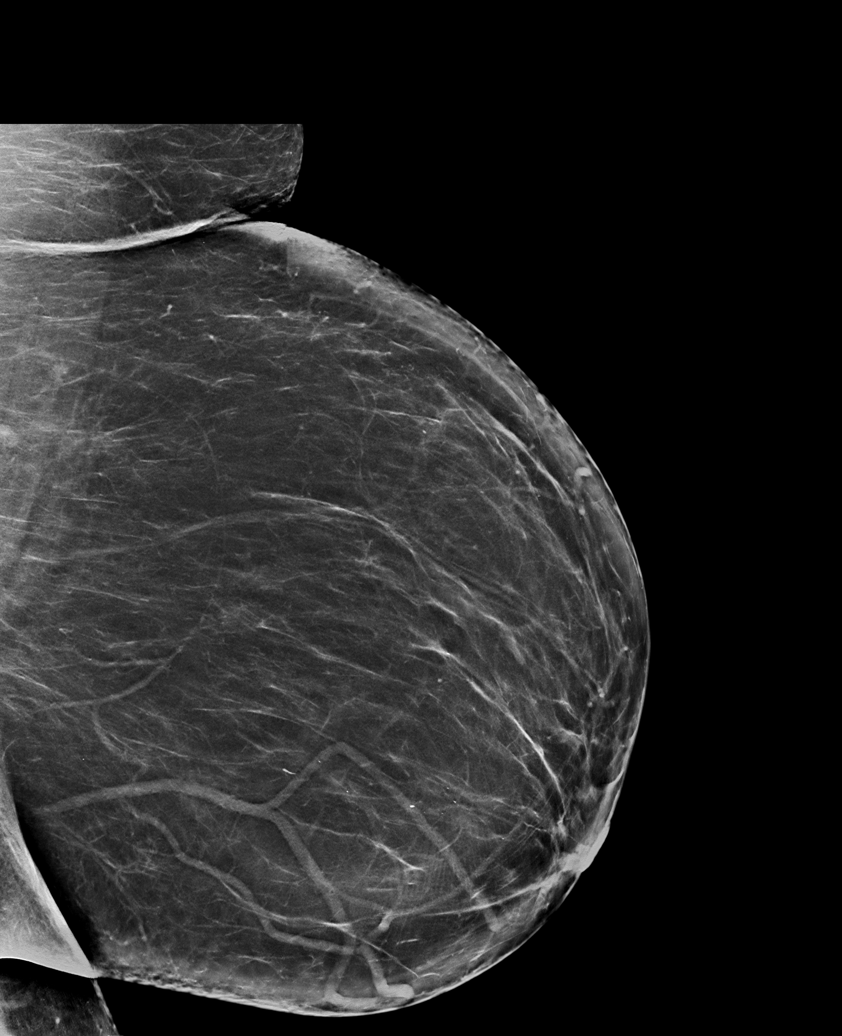

[R MLO synth-2D (2 of 2)]
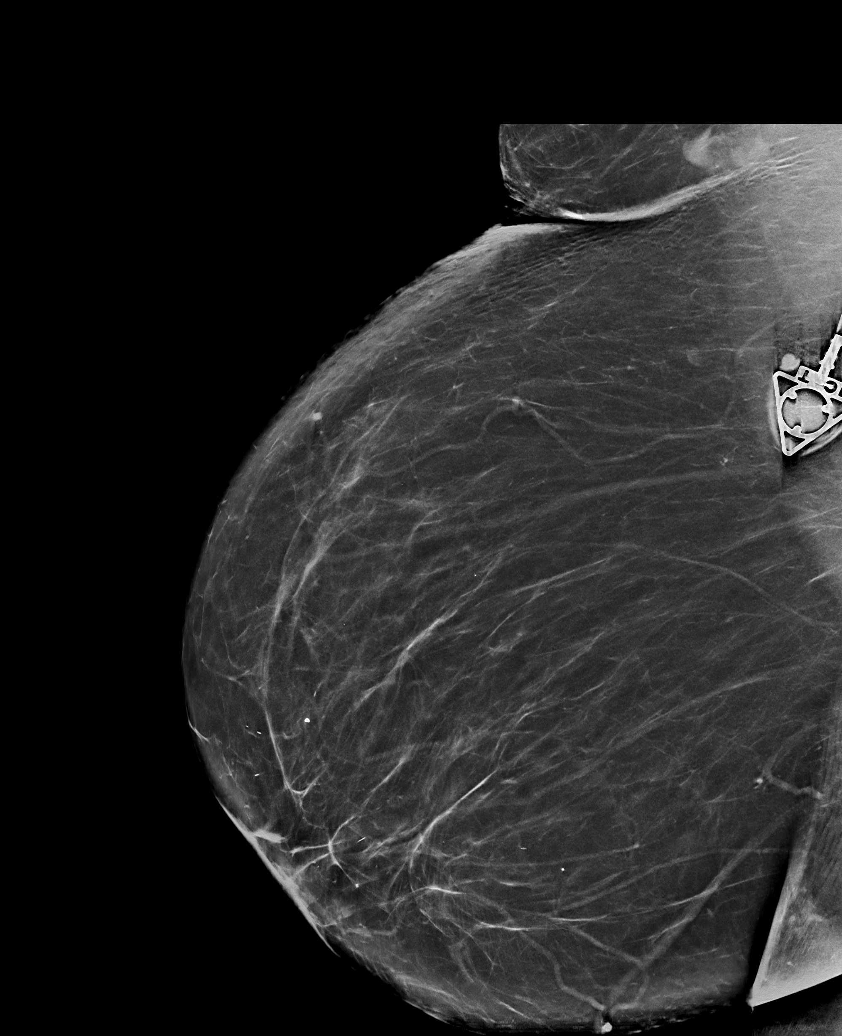

[L CC synth-2D]
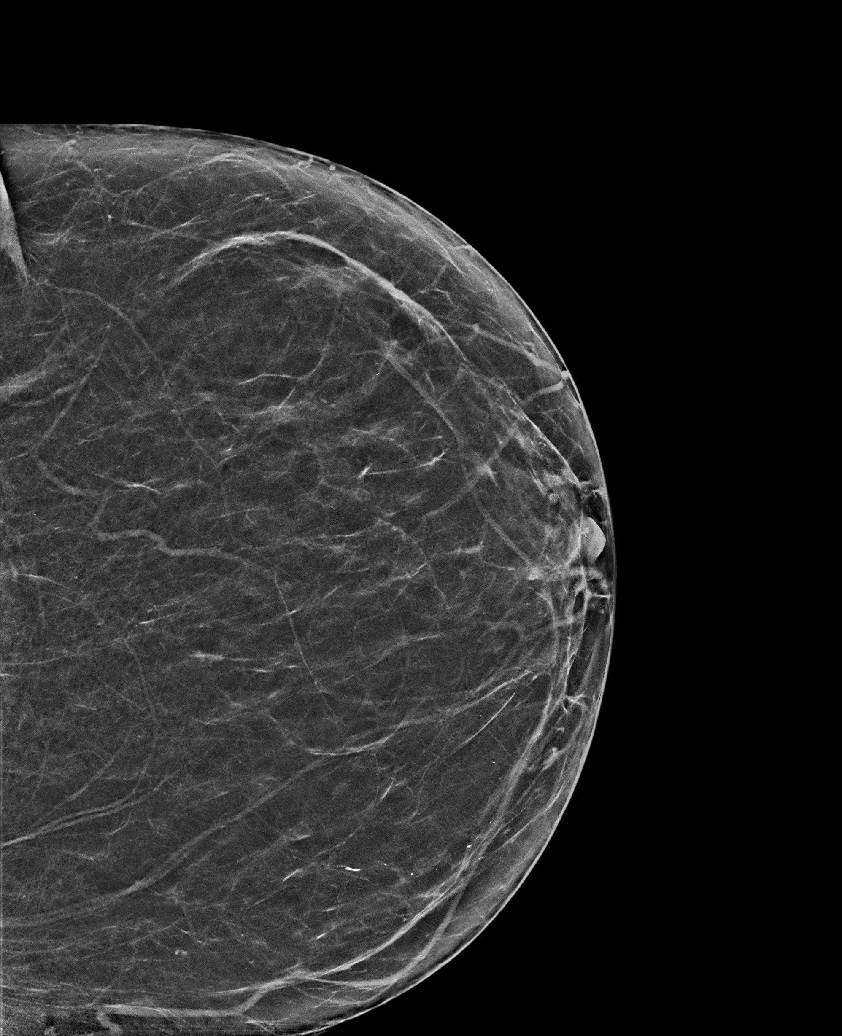

[6 of 36 positions shown; findings below may reference images not displayed]

ACR Breast Density Category b: There are scattered areas of
fibroglandular density.
FINDINGS: There are no findings suspicious for malignancy.
IMPRESSION: No mammographic evidence of malignancy. A result letter of this
screening mammogram will be mailed directly to the patient.

RECOMMENDATION:
Screening mammogram in one year. (Code:[BY])

BI-RADS CATEGORY  1: Negative.

## 2021-01-28 DIAGNOSIS — Z6841 Body Mass Index (BMI) 40.0 and over, adult: Secondary | ICD-10-CM | POA: Diagnosis not present

## 2021-01-28 DIAGNOSIS — M1711 Unilateral primary osteoarthritis, right knee: Secondary | ICD-10-CM | POA: Diagnosis not present

## 2021-02-24 ENCOUNTER — Other Ambulatory Visit: Payer: Self-pay | Admitting: Radiation Therapy

## 2021-02-28 DIAGNOSIS — D329 Benign neoplasm of meninges, unspecified: Secondary | ICD-10-CM | POA: Diagnosis not present

## 2021-02-28 DIAGNOSIS — M170 Bilateral primary osteoarthritis of knee: Secondary | ICD-10-CM | POA: Diagnosis not present

## 2021-02-28 DIAGNOSIS — R7303 Prediabetes: Secondary | ICD-10-CM | POA: Diagnosis not present

## 2021-02-28 DIAGNOSIS — C3412 Malignant neoplasm of upper lobe, left bronchus or lung: Secondary | ICD-10-CM | POA: Diagnosis not present

## 2021-02-28 DIAGNOSIS — I10 Essential (primary) hypertension: Secondary | ICD-10-CM | POA: Diagnosis not present

## 2021-02-28 DIAGNOSIS — I2510 Atherosclerotic heart disease of native coronary artery without angina pectoris: Secondary | ICD-10-CM | POA: Diagnosis not present

## 2021-03-09 ENCOUNTER — Other Ambulatory Visit (HOSPITAL_COMMUNITY): Payer: Self-pay | Admitting: Neurosurgery

## 2021-03-09 ENCOUNTER — Other Ambulatory Visit: Payer: Self-pay | Admitting: Neurosurgery

## 2021-03-09 DIAGNOSIS — D329 Benign neoplasm of meninges, unspecified: Secondary | ICD-10-CM

## 2021-03-22 ENCOUNTER — Encounter (HOSPITAL_COMMUNITY): Payer: Self-pay | Admitting: Hematology

## 2021-03-29 ENCOUNTER — Ambulatory Visit (HOSPITAL_COMMUNITY)
Admission: RE | Admit: 2021-03-29 | Discharge: 2021-03-29 | Disposition: A | Discharge status: Home / Self Care | Payer: Medicare HMO | Source: Ambulatory Visit | Attending: Neurosurgery | Admitting: Neurosurgery

## 2021-03-29 ENCOUNTER — Encounter (HOSPITAL_COMMUNITY): Payer: Self-pay

## 2021-03-29 NOTE — Progress Notes (Signed)
Pt was a No Show for MRI Brain appt today.

## 2021-04-02 ENCOUNTER — Other Ambulatory Visit: Payer: Self-pay

## 2021-04-02 ENCOUNTER — Ambulatory Visit (HOSPITAL_COMMUNITY)
Admission: RE | Admit: 2021-04-02 | Discharge: 2021-04-02 | Disposition: A | Discharge status: Home / Self Care | Payer: Medicare HMO | Source: Ambulatory Visit | Attending: Neurosurgery | Admitting: Neurosurgery

## 2021-04-02 DIAGNOSIS — D329 Benign neoplasm of meninges, unspecified: Secondary | ICD-10-CM | POA: Insufficient documentation

## 2021-04-02 DIAGNOSIS — G936 Cerebral edema: Secondary | ICD-10-CM | POA: Diagnosis not present

## 2021-04-02 IMAGING — MR MR HEAD WO/W CM
14 of 16 series · 42 of 48 positions shown · IV contrast (gadavist)
Comparison: [DATE]

CLINICAL DATA: Follow-up sphenoid wing meningioma

EXAM:
MRI HEAD WITHOUT AND WITH CONTRAST
TECHNIQUE: Multiplanar, multiecho pulse sequences of the brain and surrounding
structures were obtained without and with intravenous contrast.
CONTRAST:  10mL GADAVIST GADOBUTROL 1 MMOL/ML IV SOLN

[Series 5: DWI · axial · 3.0mm · 0.88mm/px · z∈[-121,+24]mm · 8 of 104 slices shown (1 of 4)]
[im 1/104]
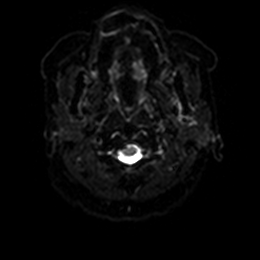
[im 15/104]
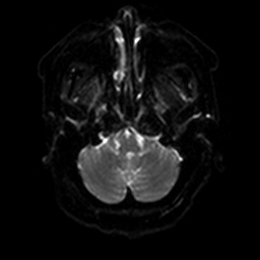
[im 30/104]
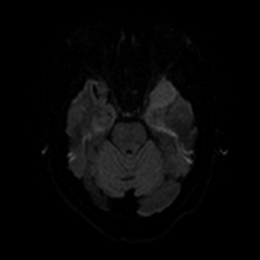
[im 45/104]
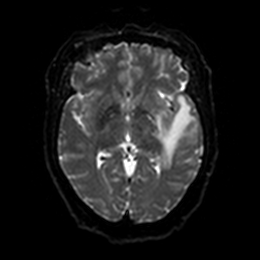
[im 59/104]
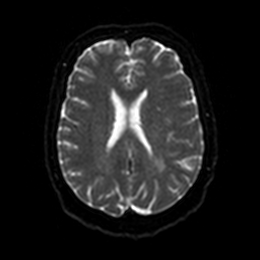
[im 74/104]
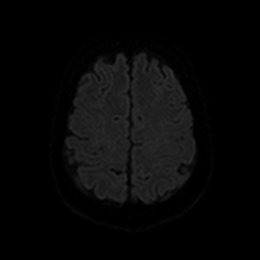
[im 89/104]
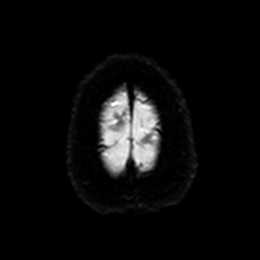
[im 104/104]
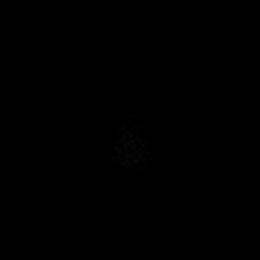

[Series 6: DWI · axial · 3.0mm · 0.88mm/px · z∈[-121,+24]mm · 3 of 52 slices shown (2 of 4)]
[im 1/52]
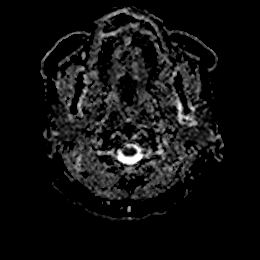
[im 26/52]
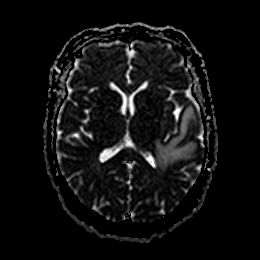
[im 52/52]
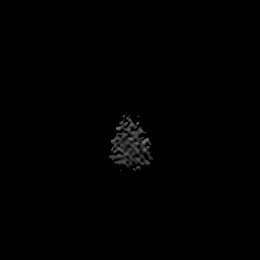

[Series 7: DWI · coronal · 4.0mm · 0.88mm/px · 5 of 72 slices shown (3 of 4)]
[im 1/72]
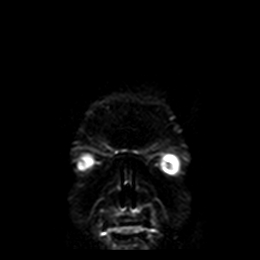
[im 18/72]
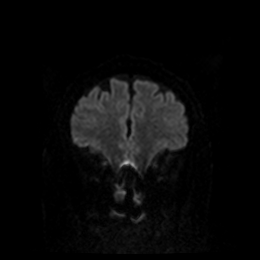
[im 36/72]
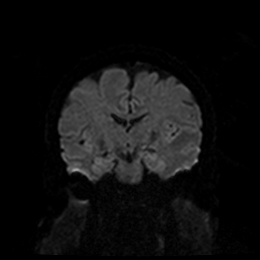
[im 54/72]
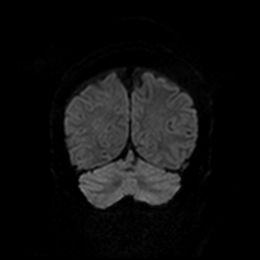
[im 72/72]
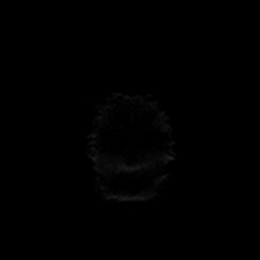

[Series 8: DWI · coronal · 4.0mm · 0.88mm/px · 2 of 36 slices shown (4 of 4)]
[im 1/36]
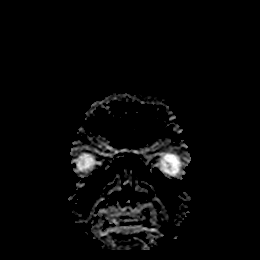
[im 36/36]
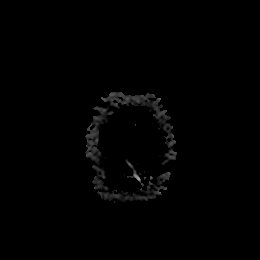

[Series 9: T1 · sagittal · 5.0mm · 0.75mm/px · 2 of 25 slices shown]
[im 1/25]
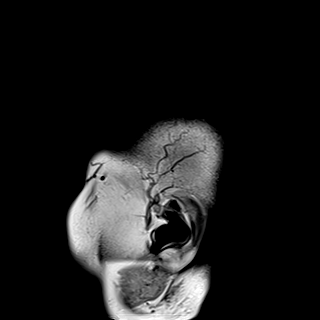
[im 25/25]
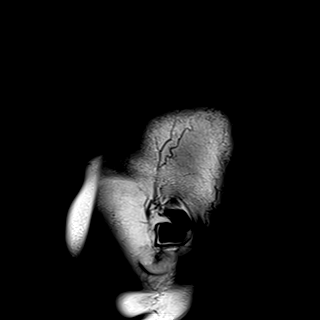

[Series 10: T2 · axial · 5.0mm · 0.72mm/px · z∈[-119,+22]mm · 2 of 26 slices shown]
[im 1/26]
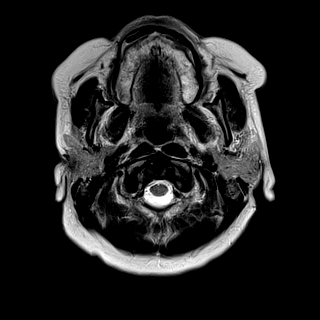
[im 26/26]
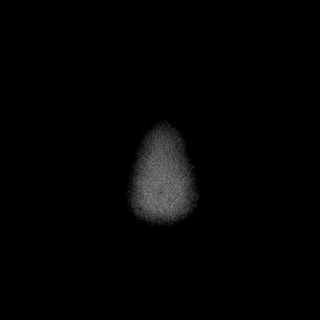

[Series 11: FLAIR · axial · 5.0mm · 0.45mm/px · z∈[-119,+22]mm · 2 of 26 slices shown]
[im 1/26]
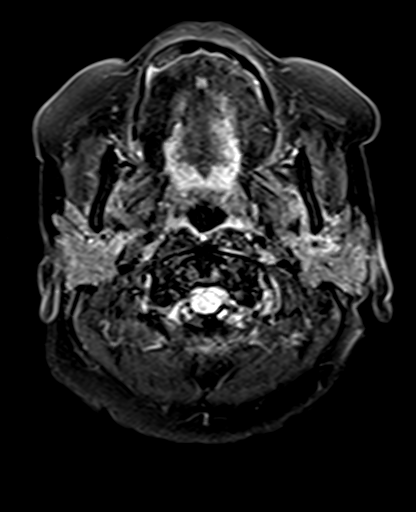
[im 26/26]
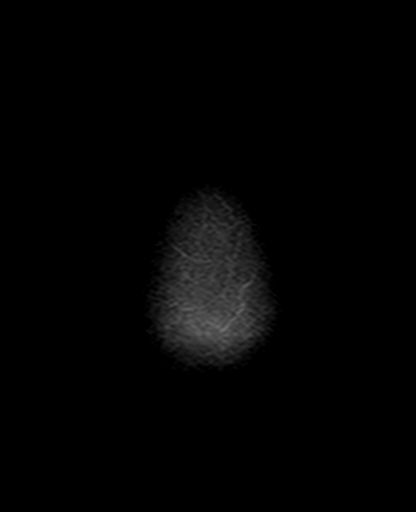

[Series 12: mag_images · axial · 3.0mm · 0.90mm/px · z∈[-121,+24]mm · 3 of 52 slices shown]
[im 1/52]
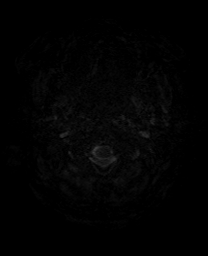
[im 26/52]
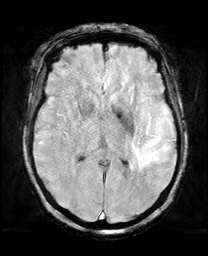
[im 52/52]
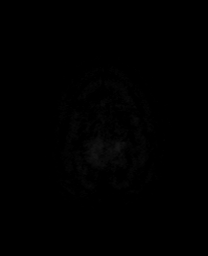

[Series 13: pha_images · axial · 3.0mm · 0.90mm/px · z∈[-121,+24]mm · 3 of 52 slices shown]
[im 1/52]
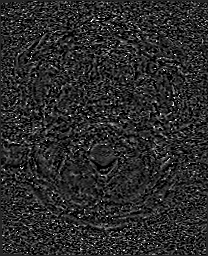
[im 26/52]
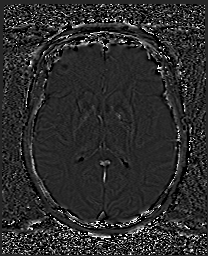
[im 52/52]
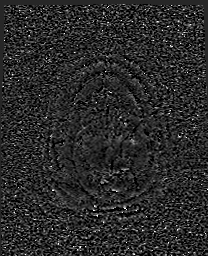

[Series 14: swi_images · axial · 3.0mm · 0.90mm/px · z∈[-121,+24]mm · 3 of 52 slices shown]
[im 1/52]
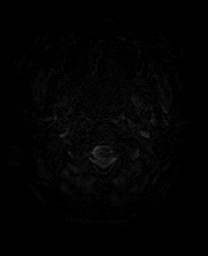
[im 26/52]
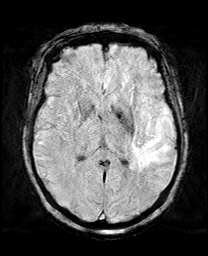
[im 52/52]
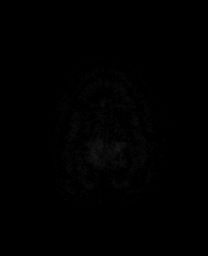

[Series 15: mip_images(sw) · axial · 24.0mm · 0.90mm/px · z∈[-111,+14]mm · 3 of 45 slices shown]
[im 1/45]
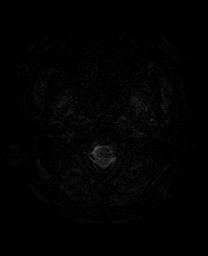
[im 23/45]
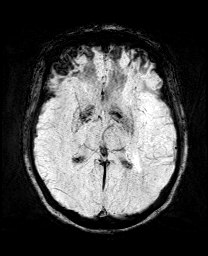
[im 45/45]
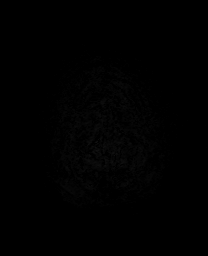

[Series 17: T2 post-contrast · coronal · 5.0mm · 0.72mm/px · 2 of 30 slices shown]
[im 1/30]
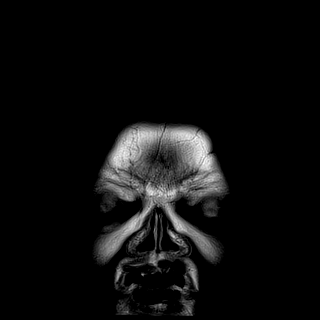
[im 30/30]
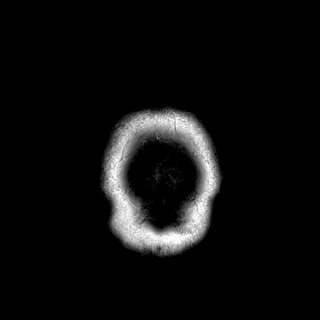

[Series 19: T1 post-contrast · coronal · 5.0mm · 0.34mm/px · 2 of 30 slices shown (1 of 2)]
[im 1/30]
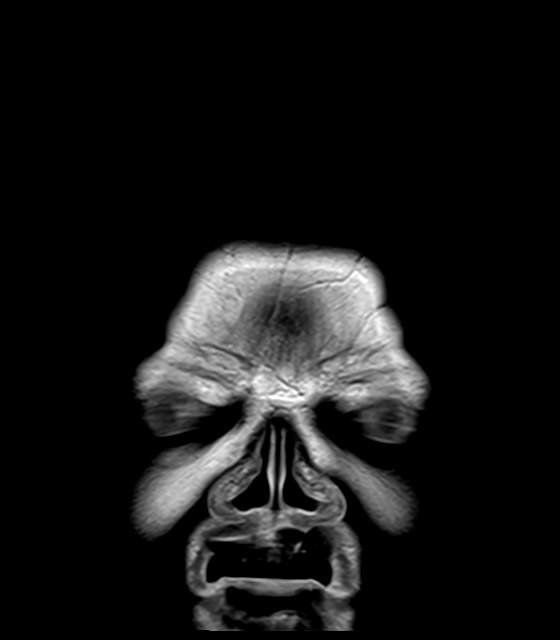
[im 30/30]
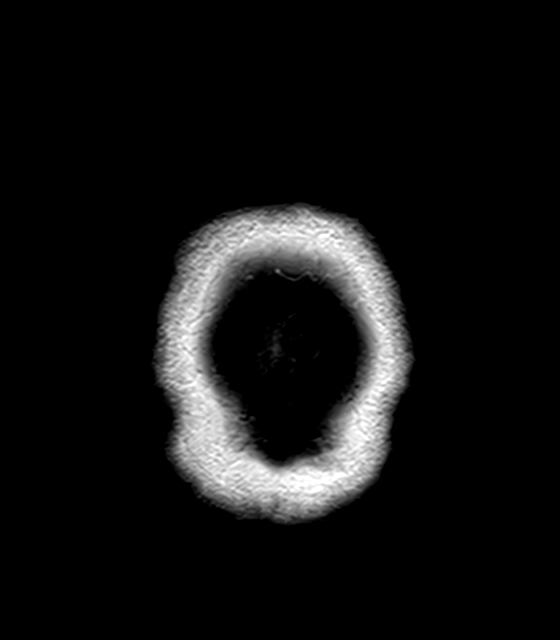

[Series 20: T1 post-contrast · sagittal · 5.0mm · 0.72mm/px · 2 of 25 slices shown (2 of 2)]
[im 1/25]
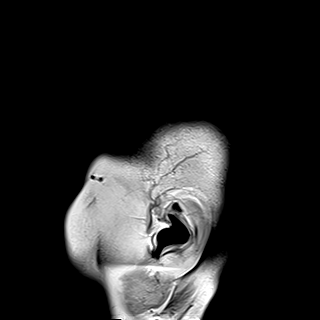
[im 25/25]
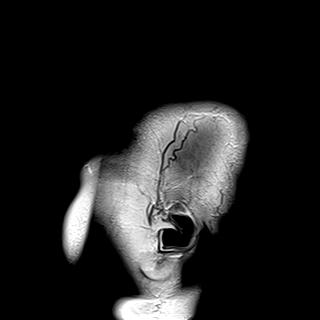

[42 of 48 positions shown; findings below may reference images not displayed]

FINDINGS: Brain: No enlargement of the dural based mass along the greater wing
left sphenoid, measuring up to 2.9 x 2.7 x 2.5 cm. In fact the mass
measures somewhat smaller, although this is likely from
measurement/reproducibility error. Parenchymal enhancement in the
anterior temporal lobe appears slightly less full. There is
vasogenic edema to an extensive degree in the left temporal lobe and
deep left white matter tracks including external capsule, but
diminished in extent and degree of swelling. No progressive mass
effect or invasive features at the level of the adjacent cavernous
sinus and optic canal.

No complicating infarct, shift, or hydrocephalus.

Vascular: Major flow voids are preserved

Skull and upper cervical spine: Unchanged marrow signal.

Sinuses/Orbits: Negative
IMPRESSION: No progression of the left sphenoid wing meningioma since [DATE]. Adjacent parenchymal enhancement at the temporal lobe is
likely decreased. Vasogenic edema in the adjacent brain is extensive
but decreased.

## 2021-04-02 MED ORDER — GADOBUTROL 1 MMOL/ML IV SOLN
10.0000 mL | Freq: Once | INTRAVENOUS | Status: AC | PRN
  Administered 2021-04-02: 10 mL via INTRAVENOUS

## 2021-04-04 ENCOUNTER — Inpatient Hospital Stay: Payer: No Typology Code available for payment source | Attending: Neurosurgery

## 2021-04-05 DIAGNOSIS — Z6841 Body Mass Index (BMI) 40.0 and over, adult: Secondary | ICD-10-CM | POA: Diagnosis not present

## 2021-04-05 DIAGNOSIS — I10 Essential (primary) hypertension: Secondary | ICD-10-CM | POA: Diagnosis not present

## 2021-04-05 DIAGNOSIS — D329 Benign neoplasm of meninges, unspecified: Secondary | ICD-10-CM | POA: Diagnosis not present

## 2021-04-19 ENCOUNTER — Encounter (HOSPITAL_COMMUNITY): Payer: Self-pay | Admitting: Hematology

## 2021-04-21 ENCOUNTER — Encounter (HOSPITAL_COMMUNITY): Payer: Self-pay | Admitting: Hematology

## 2021-04-21 ENCOUNTER — Other Ambulatory Visit: Payer: Self-pay

## 2021-04-21 ENCOUNTER — Inpatient Hospital Stay (HOSPITAL_COMMUNITY): Payer: Medicare HMO | Attending: Hematology

## 2021-04-21 ENCOUNTER — Ambulatory Visit (HOSPITAL_COMMUNITY)
Admission: RE | Admit: 2021-04-21 | Discharge: 2021-04-21 | Disposition: A | Discharge status: Home / Self Care | Payer: Medicare HMO | Source: Ambulatory Visit | Attending: Hematology | Admitting: Hematology

## 2021-04-21 DIAGNOSIS — I3139 Other pericardial effusion (noninflammatory): Secondary | ICD-10-CM | POA: Diagnosis not present

## 2021-04-21 DIAGNOSIS — C3492 Malignant neoplasm of unspecified part of left bronchus or lung: Secondary | ICD-10-CM

## 2021-04-21 DIAGNOSIS — C3490 Malignant neoplasm of unspecified part of unspecified bronchus or lung: Secondary | ICD-10-CM | POA: Diagnosis not present

## 2021-04-21 DIAGNOSIS — I700 Atherosclerosis of aorta: Secondary | ICD-10-CM | POA: Diagnosis not present

## 2021-04-21 LAB — CBC WITH DIFFERENTIAL/PLATELET
Abs Immature Granulocytes: 0.02 10*3/uL (ref 0.00–0.07)
Basophils Absolute: 0 10*3/uL (ref 0.0–0.1)
Basophils Relative: 1 %
Eosinophils Absolute: 0.2 10*3/uL (ref 0.0–0.5)
Eosinophils Relative: 5 %
HCT: 40.3 % (ref 36.0–46.0)
Hemoglobin: 12.2 g/dL (ref 12.0–15.0)
Immature Granulocytes: 1 %
Lymphocytes Relative: 15 %
Lymphs Abs: 0.6 10*3/uL — ABNORMAL LOW (ref 0.7–4.0)
MCH: 26.9 pg (ref 26.0–34.0)
MCHC: 30.3 g/dL (ref 30.0–36.0)
MCV: 89 fL (ref 80.0–100.0)
Monocytes Absolute: 0.4 10*3/uL (ref 0.1–1.0)
Monocytes Relative: 9 %
Neutro Abs: 2.8 10*3/uL (ref 1.7–7.7)
Neutrophils Relative %: 69 %
Platelets: 376 10*3/uL (ref 150–400)
RBC: 4.53 MIL/uL (ref 3.87–5.11)
RDW: 14.7 % (ref 11.5–15.5)
WBC: 4.1 10*3/uL (ref 4.0–10.5)
nRBC: 0 % (ref 0.0–0.2)

## 2021-04-21 LAB — COMPREHENSIVE METABOLIC PANEL
ALT: 19 U/L (ref 0–44)
AST: 20 U/L (ref 15–41)
Albumin: 3.4 g/dL — ABNORMAL LOW (ref 3.5–5.0)
Alkaline Phosphatase: 102 U/L (ref 38–126)
Anion gap: 8 (ref 5–15)
BUN: 8 mg/dL (ref 8–23)
CO2: 28 mmol/L (ref 22–32)
Calcium: 8.8 mg/dL — ABNORMAL LOW (ref 8.9–10.3)
Chloride: 106 mmol/L (ref 98–111)
Creatinine, Ser: 0.81 mg/dL (ref 0.44–1.00)
GFR, Estimated: >60 mL/min (ref >60)
Glucose, Bld: 102 mg/dL — ABNORMAL HIGH (ref 70–99)
Potassium: 4.6 mmol/L (ref 3.5–5.1)
Sodium: 142 mmol/L (ref 135–145)
Total Bilirubin: 0.2 mg/dL — ABNORMAL LOW (ref 0.3–1.2)
Total Protein: 6.5 g/dL (ref 6.5–8.1)

## 2021-04-21 IMAGING — CT CT CHEST W/ CM
2 of 4 series · 15 of 36 positions shown, 18 images · IV contrast (Omnipaque or Isovue)
Comparison: [DATE]

CLINICAL DATA: Metastatic left lung cancer restaging

EXAM:
CT CHEST WITH CONTRAST
TECHNIQUE: Multidetector CT imaging of the chest was performed during
intravenous contrast administration.

[Series 2: routine chest with · axial · 0.60mm/px · z∈[+1135,+1377]mm · 12 of 145 slices shown, 15 images]
[im 12/145  mediastinal]
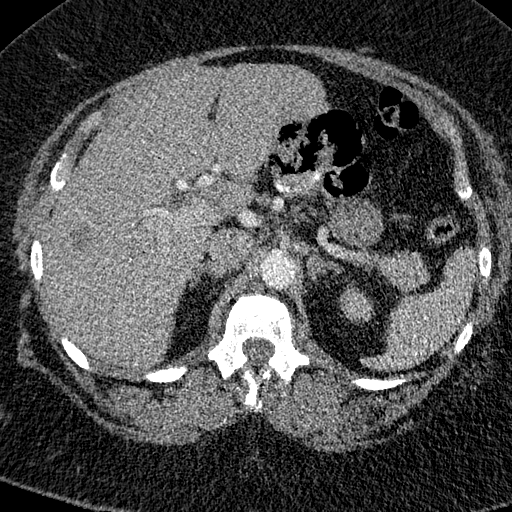
[im 12/145  lung]
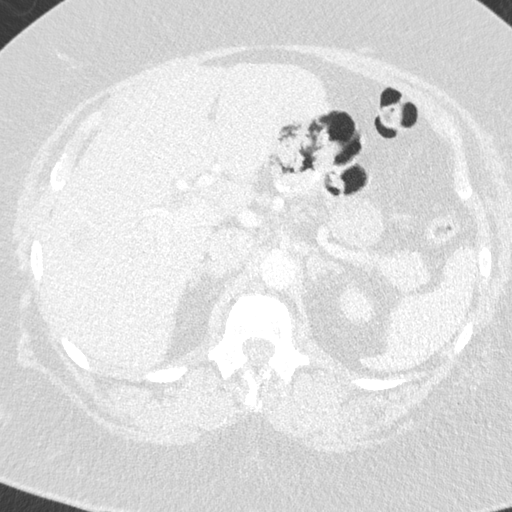
[im 23/145  lung]
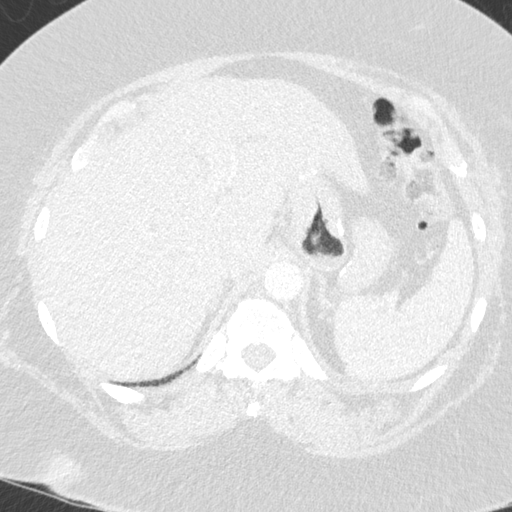
[im 34/145  lung]
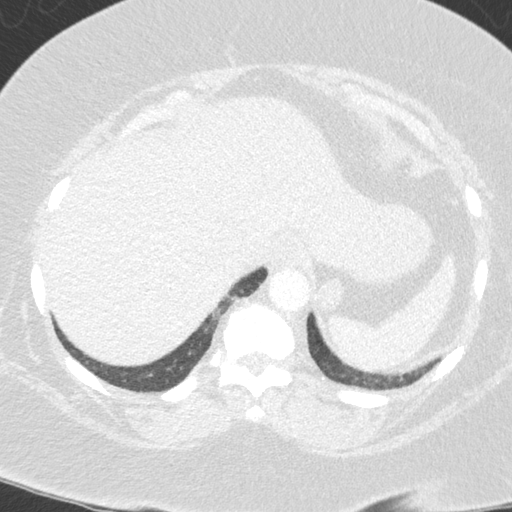
[im 45/145  lung]
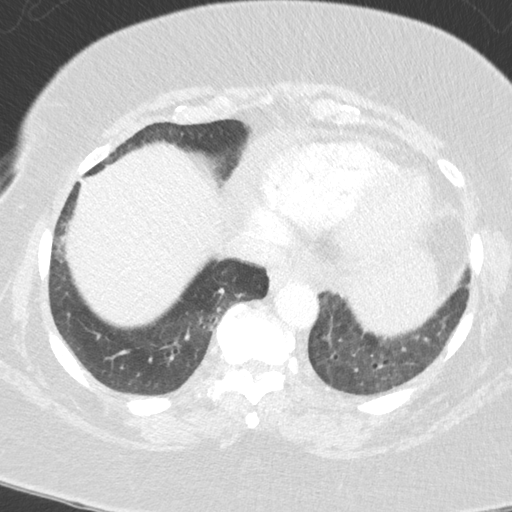
[im 56/145  mediastinal]
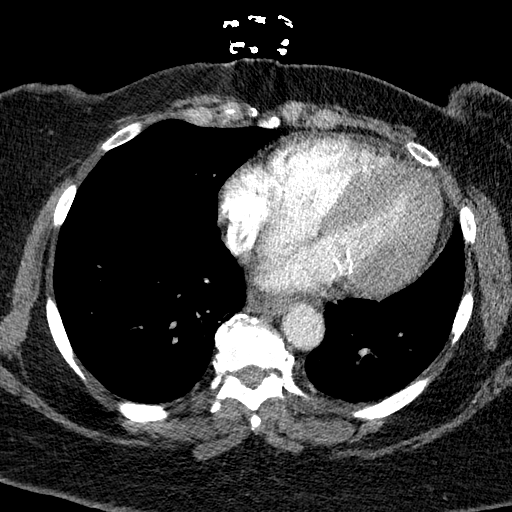
[im 56/145  lung]
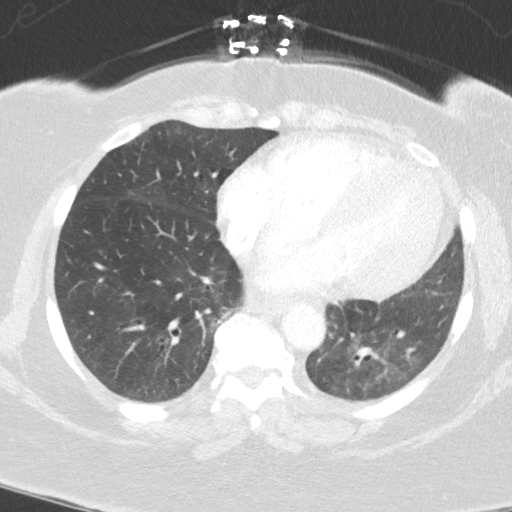
[im 67/145  lung]
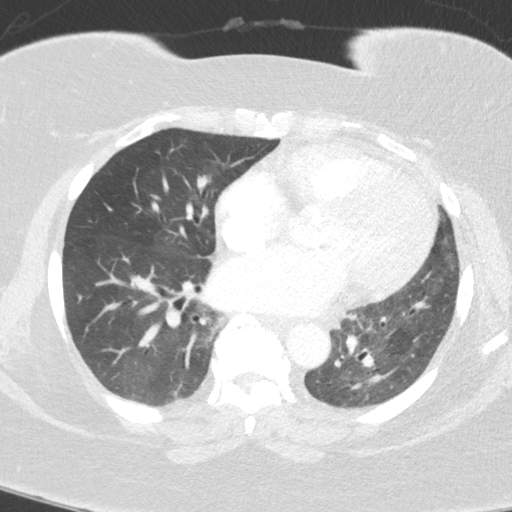
[im 78/145  lung]
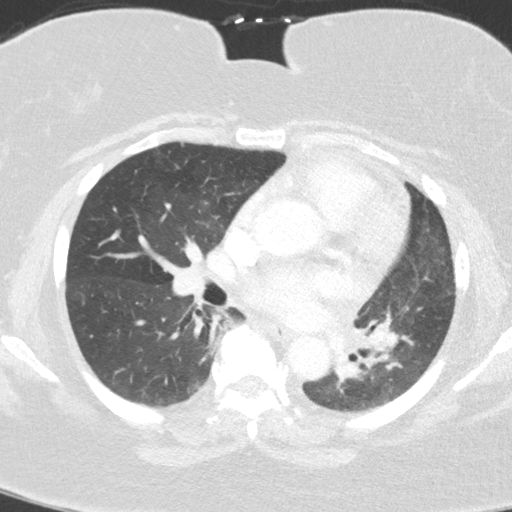
[im 89/145  lung]
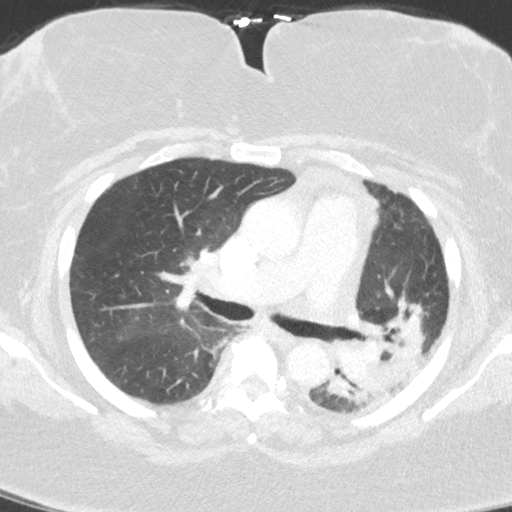
[im 100/145  mediastinal]
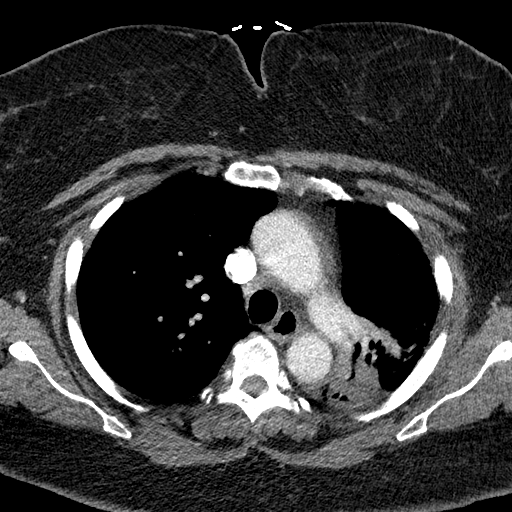
[im 100/145  lung]
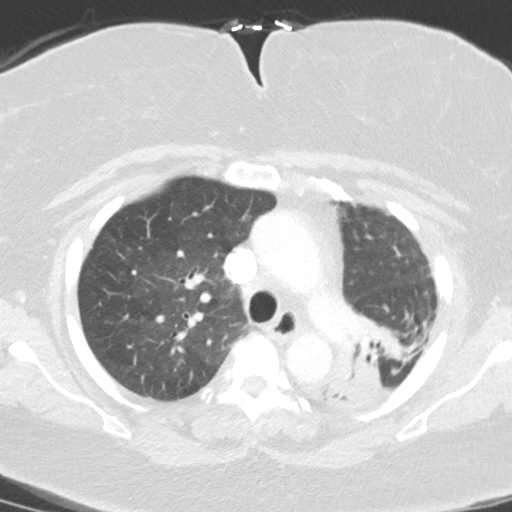
[im 111/145  lung]
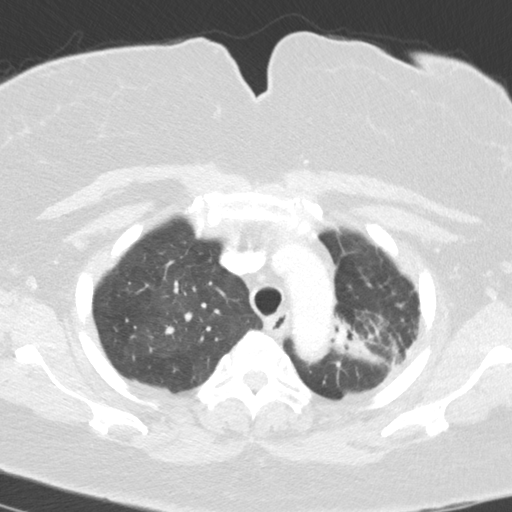
[im 122/145  lung]
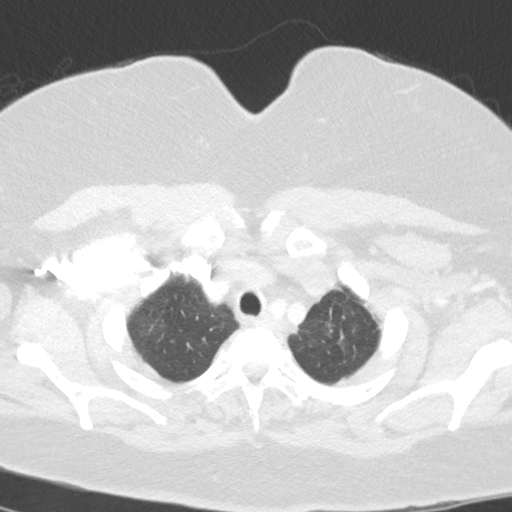
[im 133/145  lung]
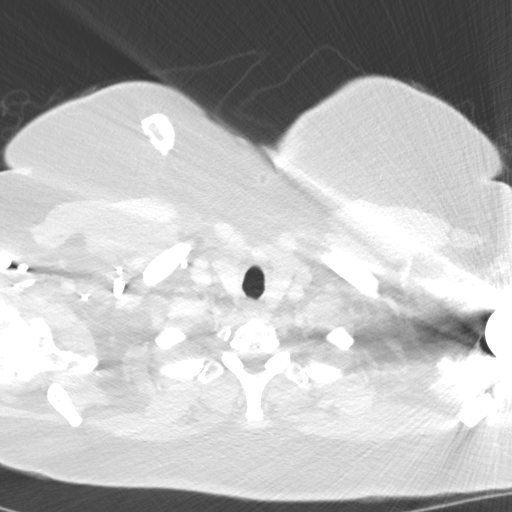

[Series 5: coronal · coronal · 0.61mm/px · 3 of 134 slices shown]
[im 27/134  lung]
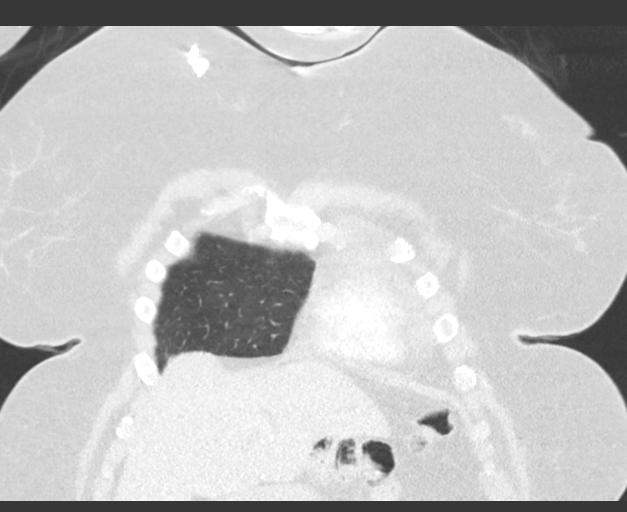
[im 54/134  lung]
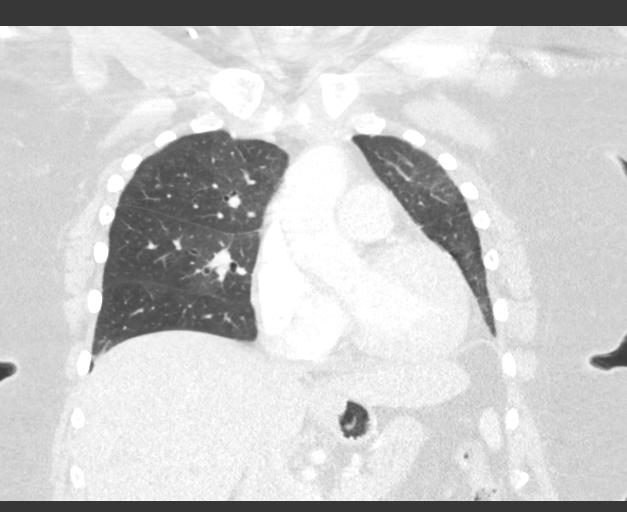
[im 80/134  lung]
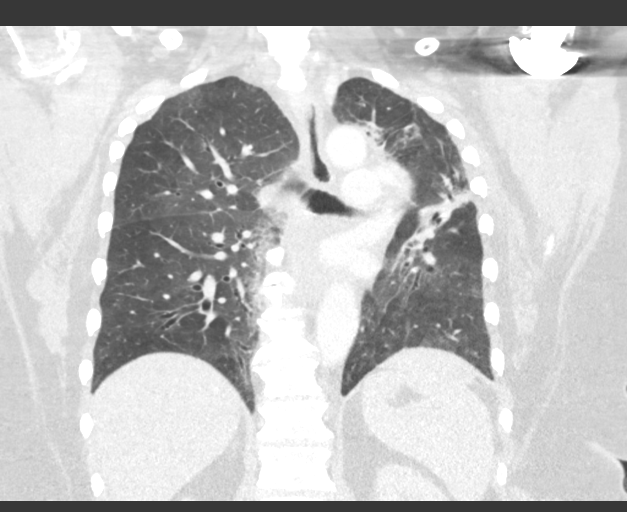

[15 of 36 positions shown; findings below may reference images not displayed]

RADIATION DOSE REDUCTION: This exam was performed according to the
departmental dose-optimization program which includes automated
exposure control, adjustment of the mA and/or kV according to
patient size and/or use of iterative reconstruction technique.

CONTRAST:  75mL OMNIPAQUE IOHEXOL 300 MG/ML  SOLN
FINDINGS: Cardiovascular: Right chest port catheter. Aortic atherosclerosis.
Aortic valve calcifications. Mild cardiomegaly. Unchanged trace
pericardial effusion.

Mediastinum/Nodes: Unchanged post treatment appearance of soft
tissue about the left hilum (series 2, image 63) no discretely
enlarged mediastinal, hilar, or axillary lymph nodes. Thyroid gland,
trachea, and esophagus demonstrate no significant findings.

Lungs/Pleura: Unchanged post treatment appearance of the left chest
with extensive perihilar fibrosis and consolidation of the left lung
and volume loss of the left hemithorax (series 4, image 57). No
pleural effusion or pneumothorax.

Upper Abdomen: No acute abnormality. Multiple low-attenuation liver
lesions are incompletely characterized on this examination but
unchanged. Partially imaged postoperative findings of Roux type
gastric bypass. Status post cholecystectomy.

Musculoskeletal: No chest wall abnormality. No suspicious osseous
lesions identified.
IMPRESSION: 1. Unchanged post treatment appearance of the left chest with
extensive perihilar fibrosis and consolidation of the left lung as
well as volume loss of the left hemithorax.
2. Unchanged post treatment appearance of the left hilum. No
discretely enlarged lymph nodes.
3. No evidence of metastatic disease in the chest.
4. Cardiomegaly.
5. Aortic valve calcifications. Correlate with echocardiographic
evidence of aortic valve dysfunction.

Aortic Atherosclerosis ([7Z]-[7Z]).

## 2021-04-21 MED ORDER — IOHEXOL 300 MG/ML  SOLN
75.0000 mL | Freq: Once | INTRAMUSCULAR | Status: AC | PRN
  Administered 2021-04-21: 75 mL via INTRAVENOUS

## 2021-04-22 ENCOUNTER — Other Ambulatory Visit: Payer: Self-pay | Admitting: Cardiology

## 2021-04-22 ENCOUNTER — Other Ambulatory Visit: Payer: Self-pay | Admitting: Radiation Therapy

## 2021-04-28 ENCOUNTER — Other Ambulatory Visit: Payer: Self-pay

## 2021-04-28 ENCOUNTER — Inpatient Hospital Stay (HOSPITAL_COMMUNITY): Payer: Medicare HMO | Admitting: Hematology

## 2021-04-28 ENCOUNTER — Encounter (HOSPITAL_COMMUNITY): Payer: Self-pay | Admitting: Hematology

## 2021-04-28 VITALS — BP 160/89 | HR 91 | Temp 97.8°F | Resp 20 | Wt 270.0 lb

## 2021-04-28 DIAGNOSIS — I3139 Other pericardial effusion (noninflammatory): Secondary | ICD-10-CM | POA: Insufficient documentation

## 2021-04-28 DIAGNOSIS — C3412 Malignant neoplasm of upper lobe, left bronchus or lung: Secondary | ICD-10-CM | POA: Insufficient documentation

## 2021-04-28 DIAGNOSIS — C3492 Malignant neoplasm of unspecified part of left bronchus or lung: Secondary | ICD-10-CM

## 2021-04-28 NOTE — Progress Notes (Signed)
[place], [place] [address]   CLINIC:  Medical Oncology/Hematology  PCP:  Iona Beard, [address] / [place] [address] [phone]   REASON FOR VISIT:  Follow-up for stage III left lung adenocarcinoma  PRIOR THERAPY:  1. Chemoradiation with carboplatin and paclitaxel from 01/07/2019 to 02/11/2019. 2. Consolidation with durvalumab from 03/19/2019 to 04/16/2019, held due to pneumonitis.  NGS Results: not done  CURRENT THERAPY: Consolidation with durvalumab every 2 weeks; brain mets SRS in 5 fractions  BRIEF ONCOLOGIC HISTORY:  Oncology History  Adenocarcinoma of left lung ([place])  12/09/2018 Initial Diagnosis   Adenocarcinoma of left lung ([facility])   01/02/2019 Cancer Staging   Staging form: Lung, AJCC 8th Edition - Clinical stage from 01/02/2019: Stage IIIB (cT3, cN2, cM0) - Signed by Derek Jack, MD on 01/02/2019    01/07/2019 - 02/11/2019 Chemotherapy   The patient had palonosetron (ALOXI) injection 0.25 mg, 0.25 mg, Intravenous,  Once, 6 of 6 cycles Administration: 0.25 mg (01/07/2019), 0.25 mg (01/14/2019), 0.25 mg (01/21/2019), 0.25 mg (01/28/2019), 0.25 mg (02/04/2019), 0.25 mg (02/11/2019) CARBOplatin (PARAPLATIN) 270 mg in sodium chloride 0.9 % 250 mL chemo infusion, 270 mg (100 % of original dose 266.4 mg), Intravenous,  Once, 6 of 6 cycles Dose modification:   (original dose 266.4 mg, Cycle 1),   (original dose 266.4 mg, Cycle 2),   (original dose 266.4 mg, Cycle 3),   (original dose 266.4 mg, Cycle 4) Administration: 270 mg (01/07/2019), 270 mg (01/14/2019), 270 mg (01/21/2019), 270 mg (01/28/2019), 270 mg (02/04/2019), 270 mg (02/11/2019) PACLitaxel (TAXOL) 108 mg in sodium chloride 0.9 % 250 mL chemo infusion (</= 80mg /m2), 45 mg/m2 = 108 mg, Intravenous,  Once, 6 of 6 cycles Administration: 108 mg (01/07/2019), 108 mg (01/14/2019), 108 mg (01/21/2019), 108 mg (01/28/2019), 108 mg (02/04/2019), 108 mg  (02/11/2019) fosaprepitant (EMEND) 150 mg, dexamethasone (DECADRON) 12 mg in sodium chloride 0.9 % 145 mL IVPB, , Intravenous,  Once, 5 of 5 cycles Administration:  (01/14/2019),  (01/21/2019),  (01/28/2019),  (02/04/2019),  (02/11/2019)   for chemotherapy treatment.     03/19/2019 -  Chemotherapy    Patient is on Treatment Plan: LUNG DURVALUMAB Q14D         CANCER STAGING:  Cancer Staging  Adenocarcinoma of left lung [facility]) Staging form: Lung, AJCC 8th Edition - Clinical stage from 01/02/2019: Stage IIIB (cT3, cN2, cM0) - Signed by Derek Jack, MD on 01/02/2019   INTERVAL HISTORY:  Denise [name], a 71 y.o. female, returns for routine follow-up of her stage III left lung adenocarcinoma. Denise [name] was last seen on 12/20/2020.   Today she reports feeling good. She denies hemoptysis and CP. She reports occasional cough in the morning productive of clear mucous.   REVIEW OF SYSTEMS:  Review of Systems  Constitutional:  Positive for fatigue. Negative for appetite change.  Respiratory:  Positive for cough and shortness of breath (with exertion). Negative for hemoptysis.   Cardiovascular:  Negative for chest pain.  Musculoskeletal:  Positive for arthralgias (7/10 R knee).  All other systems reviewed and are negative.  PAST MEDICAL/SURGICAL HISTORY:  Past Medical History:  Diagnosis Date   Anemia    Aortic stenosis    Arthritis    Cancer (HCC)    Lung   Coronary artery calcification seen on CT scan    Essential hypertension    GERD (gastroesophageal reflux disease)    History of lung cancer    Stage  III adenocarcinoma status post chemoradiation   Port-A-Cath in place 01/06/2019   Type 2 diabetes mellitus [facility])    Past Surgical History:  Procedure Laterality Date   CHOLECYSTECTOMY  1997   COLONOSCOPY     GASTRIC BYPASS     INCISIONAL HERNIA REPAIR  04/11/11   IR IMAGING GUIDED PORT INSERTION  12/27/2018   Right   LAPAROSCOPIC SALPINGOOPHERECTOMY     LAPAROTOMY   04/11/2011   Procedure: EXPLORATORY LAPAROTOMY;  Surgeon: Joyice Faster. Cornett, MD;  Location: [facility];  Service: General;  Laterality: N/A;  closure port hole   RIGHT/LEFT HEART CATH AND CORONARY ANGIOGRAPHY N/A 12/29/2019   Procedure: RIGHT/LEFT HEART CATH AND CORONARY ANGIOGRAPHY;  Surgeon: Troy Sine, MD;  Location: [place] [facility];  Service: Cardiovascular;  Laterality: N/A;   TOTAL HIP ARTHROPLASTY  03/07/2012   Procedure: TOTAL HIP ARTHROPLASTY ANTERIOR APPROACH;  Surgeon: Mauri Pole, MD;  Location: [facility];  Service: Orthopedics;  Laterality: Right;   TOTAL SHOULDER ARTHROPLASTY Left 01/21/2015   TOTAL SHOULDER ARTHROPLASTY Left 01/21/2015   Procedure: LEFT TOTAL SHOULDER ARTHROPLASTY;  Surgeon: Justice Britain, MD;  Location: [place];  Service: Orthopedics;  Laterality: Left;   VAGINAL HYSTERECTOMY      SOCIAL HISTORY:  Social History   Socioeconomic History   Marital status: Single    Spouse name: Not on file   Number of children: Not on file   Years of education: 12th grade   Highest education level: Not on file  Occupational History   Occupation: Employed    Employer: [employer]  Tobacco Use   Smoking status: Former    Packs/day: 1.00    Years: 12.00    Pack years: 12.00    Types: Cigarettes    Quit date: 03/20/1976    Years since quitting: 45.1   Smokeless tobacco: Never  Vaping Use   Vaping Use: Never used  Substance and Sexual Activity   Alcohol use: No   Drug use: No   Sexual activity: Not Currently  Other Topics Concern   Not on file  Social History Narrative   Not on file   Social Determinants of Health   Financial Resource Strain: Not on file  Food Insecurity: Not on file  Transportation Needs: Not on file  Physical Activity: Not on file  Stress: Not on file  Social Connections: Not on file  Intimate Partner Violence: Not on file    FAMILY HISTORY:  Family History  Problem Relation Age of Onset   Breast cancer Mother    COPD  Mother    Arthritis Mother    Diabetes Mother    Hypertension Mother    Hypertension Father    Diabetes Father    Breast cancer Sister    Thyroid cancer Brother    Huntington's disease Maternal Grandmother    Heart attack Brother     CURRENT MEDICATIONS:  Current Outpatient Medications  Medication Sig Dispense Refill   Acetaminophen (TYLENOL 8 HOUR ARTHRITIS PAIN PO) Take 650 ng by mouth 2 (two) times daily.      albuterol (VENTOLIN HFA) 108 (90 Base) MCG/ACT inhaler TAKE 2 PUFFS BY MOUTH EVERY 6 HOURS AS NEEDED FOR WHEEZE OR SHORTNESS OF BREATH 18 each 6   amLODipine (NORVASC) 5 MG tablet Take 5 mg by mouth daily.      aspirin EC 81 MG tablet Take 81 mg by mouth daily. Swallow whole.     Calcium Carbonate Antacid (TUMS PO) Take 4 tablets  by mouth daily.     carboxymethylcellul-glycerin (OPTIVE) 0.5-0.9 % ophthalmic solution Place 1-2 drops into both eyes 3 (three) times daily as needed (dry/irritated eyes.).     carvedilol (COREG) 3.125 MG tablet TAKE 1 TABLET BY MOUTH TWICE A DAY 60 tablet 0   Cyanocobalamin (VITAMIN B-12) 2500 MCG SUBL Place 2,500 mcg under the tongue every morning.      diphenhydrAMINE (BENADRYL) 25 MG tablet Take 25 mg by mouth every 6 (six) hours as needed for itching or allergies.     DURVALUMAB IV Inject into the vein every 14 (fourteen) days.     ferrous sulfate 325 (65 FE) MG tablet Take 325 mg by mouth every evening.     furosemide (LASIX) 20 MG tablet TAKE 1 TABLET BY MOUTH AS NEEDED FOR FLUID OR EDEMA (DAILY AS NEEDED FOR SWELLING). 90 tablet 1   gabapentin (NEURONTIN) 300 MG capsule Take by mouth.     HYDROcodone-acetaminophen (NORCO/VICODIN) 5-325 MG tablet Take 1 tablet by mouth 2 (two) times daily as needed.     lidocaine-prilocaine (EMLA) cream APPLY SMALL AMOUNT TO PORT A CATH SITE AND COVER WITH PLASTIC WRAP 1 HR PRIOR TO CHEMOTHERAPY APPTS 30 g 3   Multiple Vitamin (MULITIVITAMIN WITH MINERALS) TABS Take 1 tablet by mouth daily.     naproxen  (NAPROSYN) 500 MG tablet Take 500 mg by mouth every 12 (twelve) hours.      omeprazole (PRILOSEC) 20 MG capsule Take 20 mg by mouth daily.     Current Facility-Administered Medications  Medication Dose Route Frequency Provider Last Rate Last Admin   sodium chloride flush (NS) 0.9 % injection 3 mL  3 mL Intravenous Q12H Satira Sark, MD        ALLERGIES:  No Known Allergies  PHYSICAL EXAM:  Performance status (ECOG): 1 - Symptomatic but completely ambulatory  Vitals:   04/28/21 1444  BP: (!) 160/89  Pulse: 91  Resp: 20  Temp: 97.8 F (36.6 C)  SpO2: 94%   Wt Readings from Last 3 Encounters:  04/28/21 270 lb (122.5 kg)  12/20/20 272 lb 6.4 oz (123.6 kg)  09/16/20 278 lb 12.8 oz (126.5 kg)   Physical Exam Vitals reviewed.  Constitutional:      Appearance: Normal appearance. She is obese.  Cardiovascular:     Rate and Rhythm: Normal rate and regular rhythm.     Pulses: Normal pulses.     Heart sounds: Murmur heard.  Pulmonary:     Effort: Pulmonary effort is normal.     Breath sounds: Normal breath sounds.  Neurological:     General: No focal deficit present.     Mental Status: She is alert and oriented to person, place, and time.  Psychiatric:        Mood and Affect: Mood normal.        Behavior: Behavior normal.     LABORATORY DATA:  I have reviewed the labs as listed.  CBC Latest Ref Rng & Units 04/21/2021 12/15/2020 10/08/2020  WBC 4.0 - 10.5 K/uL 4.1 5.3 4.8  Hemoglobin 12.0 - 15.0 g/dL 12.2 12.8 13.0  Hematocrit 36.0 - 46.0 % 40.3 41.8 41.8  Platelets 150 - 400 K/uL 376 346 352   CMP Latest Ref Rng & Units 04/21/2021 12/15/2020 10/08/2020  Glucose 70 - 99 mg/dL 102(H) 117(H) 112(H)  BUN 8 - 23 mg/dL 8 10 10   Creatinine 0.44 - 1.00 mg/dL 0.81 0.70 0.75  Sodium 135 - 145 mmol/L 142 142 141  Potassium 3.5 - 5.1 mmol/L 4.6 4.3 4.2  Chloride 98 - 111 mmol/L 106 106 109  CO2 22 - 32 mmol/L 28 27 26   Calcium 8.9 - 10.3 mg/dL 8.8(L) 9.1 8.9  Total Protein 6.5 -  8.1 g/dL 6.5 6.8 7.1  Total Bilirubin 0.3 - 1.2 mg/dL 0.2(L) 0.3 0.5  Alkaline Phos 38 - 126 U/L 102 93 94  AST 15 - 41 U/L 20 18 18   ALT 0 - 44 U/L 19 17 16     DIAGNOSTIC IMAGING:  I have independently reviewed the scans and discussed with the patient. CT Chest W Contrast  Result Date: 04/22/2021 CLINICAL DATA:  Metastatic left lung cancer restaging EXAM: CT CHEST WITH CONTRAST TECHNIQUE: Multidetector CT imaging of the chest was performed during intravenous contrast administration. RADIATION DOSE REDUCTION: This exam was performed according to the departmental dose-optimization program which includes automated exposure control, adjustment of the mA and/or kV according to patient size and/or use of iterative reconstruction technique. CONTRAST:  45mL OMNIPAQUE IOHEXOL 300 MG/ML  SOLN COMPARISON:  12/15/2020 FINDINGS: Cardiovascular: Right chest port catheter. Aortic atherosclerosis. Aortic valve calcifications. Mild cardiomegaly. Unchanged trace pericardial effusion. Mediastinum/Nodes: Unchanged post treatment appearance of soft tissue about the left hilum (series 2, image 63) no discretely enlarged mediastinal, hilar, or axillary lymph nodes. Thyroid gland, trachea, and esophagus demonstrate no significant findings. Lungs/Pleura: Unchanged post treatment appearance of the left chest with extensive perihilar fibrosis and consolidation of the left lung and volume loss of the left hemithorax (series 4, image 57). No pleural effusion or pneumothorax. Upper Abdomen: No acute abnormality. Multiple low-attenuation liver lesions are incompletely characterized on this examination but unchanged. Partially imaged postoperative findings of Roux type gastric bypass. Status post cholecystectomy. Musculoskeletal: No chest wall abnormality. No suspicious osseous lesions identified. IMPRESSION: 1. Unchanged post treatment appearance of the left chest with extensive perihilar fibrosis and consolidation of the left lung as  well as volume loss of the left hemithorax. 2. Unchanged post treatment appearance of the left hilum. No discretely enlarged lymph nodes. 3. No evidence of metastatic disease in the chest. 4. Cardiomegaly. 5. Aortic valve calcifications. Correlate with echocardiographic evidence of aortic valve dysfunction. Aortic Atherosclerosis (ICD10-I70.0). Electronically Signed   By: Delanna Ahmadi M.D.   On: 04/22/2021 17:15   MR BRAIN W WO CONTRAST  Result Date: 04/03/2021 CLINICAL DATA:  Follow-up sphenoid wing meningioma EXAM: MRI HEAD WITHOUT AND WITH CONTRAST TECHNIQUE: Multiplanar, multiecho pulse sequences of the brain and surrounding structures were obtained without and with intravenous contrast. CONTRAST:  48mL GADAVIST GADOBUTROL 1 MMOL/ML IV SOLN COMPARISON:  12/31/2020 FINDINGS: Brain: No enlargement of the dural based mass along the greater wing left sphenoid, measuring up to 2.9 x 2.7 x 2.5 cm. In fact the mass measures somewhat smaller, although this is likely from measurement/reproducibility error. Parenchymal enhancement in the anterior temporal lobe appears slightly less full. There is vasogenic edema to an extensive degree in the left temporal lobe and deep left white matter tracks including external capsule, but diminished in extent and degree of swelling. No progressive mass effect or invasive features at the level of the adjacent cavernous sinus and optic canal. No complicating infarct, shift, or hydrocephalus. Vascular: Major flow voids are preserved Skull and upper cervical spine: Unchanged marrow signal. Sinuses/Orbits: Negative IMPRESSION: No progression of the left sphenoid wing meningioma since October 2022. Adjacent parenchymal enhancement at the temporal lobe is likely decreased. Vasogenic edema in the adjacent brain is extensive but decreased. Electronically Signed  By: Jorje Guild M.D.   On: 04/03/2021 11:04     ASSESSMENT:  1.  Adenocarcinoma of left lung (HCC) -Chemoradiation  therapy with carboplatin and paclitaxel from 01/07/2019 through 02/11/2019. -Consolidation immunotherapy with durvalumab from 03/19/2019 through 04/15/2018, held due to pneumonitis. -CT chest on 07/02/2019 shows left upper lobe lung mass measuring 2.6 x 2.0 cm.  It shows improvement in size. -MRI of the brain on 07/18/2019 showed left sphenoid wing meningioma measuring 2.6 x 2.0 x 2.6 cm unchanged.  No new enhancing intracranial lesion.  Increased left temporal white matter edema. -Durvalumab restarted on 07/24/2019. -We reviewed CT chest with contrast from 09/29/2019 which showed stable appearance of treated lung lesion within the posterior left upper lobe.  New bandlike area of increased soft tissue density within the anterior to the treated lung lesion favored to be progressive changes from radiation.  Local tumor recurrence is less favored.  Stable pericardial effusion. -CT chest on 12/10/2019 shows posttreatment appearance of the left lung with associated scarring and volume loss.  No evidence of recurrence or metastatic disease.  Unchanged small pericardial effusion. -She was evaluated by cardiology with a stress test.  EF was 46%.  She underwent cardiac catheterization which did not show any abnormalities. - 1 year of durvalumab completed on 07/23/2020.   PLAN:  1.  Clinical stage IIIb (T3N2) adenocarcinoma of the lung: - She denies any change in her shortness of breath or cough. - Denies any recent infections or hospitalizations. - Reviewed CT chest with contrast from 04/21/2021 which showed posttreatment changes in the left chest with some perihilar fibrosis and consolidation of the left lung.  No evidence of metastatic disease in the chest.  Other none malignant findings like cardiomegaly and aortic valve calcifications were discussed with the patient. - She does have ejection systolic murmur and moderate AS on most recent echo.  She will follow-up with her cardiologist. - Labs reviewed by me shows  normal CBC and CMP. - Recommend follow-up in 6 months with repeat CT scan with contrast.   2.  Meningioma: - She is following up with her neurosurgeon.  Recent MRI was stable.  They reportedly switched her to 32-month visits.      Orders placed this encounter:  No orders of the defined types were placed in this encounter.    Derek Jack, MD [place] [phone]   I, Thana Ates, am acting as a scribe for Dr. Derek Jack.  I, Derek Jack MD, have reviewed the above documentation for accuracy and completeness, and I agree with the above.

## 2021-04-28 NOTE — Patient Instructions (Signed)
[place] at [facility] Discharge Instructions  You were seen and examined today by Dr. Delton Coombes. He reviewed your most recent labs. Please keep follow up appointment as scheduled in 6 months.   Thank you for choosing [place] at [facility] to provide your oncology and hematology care.  To afford each patient quality time with our provider, please arrive at least 15 minutes before your scheduled appointment time.   If you have a lab appointment with the [facility] please come in thru the Main Entrance and check in at the main information desk.  You need to re-schedule your appointment should you arrive 10 or more minutes late.  We strive to give you quality time with our providers, and arriving late affects you and other patients whose appointments are after yours.  Also, if you no show three or more times for appointments you may be dismissed from the clinic at the providers discretion.     Again, thank you for choosing [facility].  Our hope is that these requests will decrease the amount of time that you wait before being seen by our physicians.       _____________________________________________________________  Should you have questions after your visit to [facility], please contact our office at [phone] and follow the prompts.  Our office hours are 8:00 a.m. and 4:30 p.m. Monday - Friday.  Please note that voicemails left after 4:00 p.m. may not be returned until the following business day.  We are closed weekends and major holidays.  You do have access to a nurse 24-7, just call the main number to the clinic [phone] and do not press any options, hold on the line and a nurse will answer the phone.    For prescription refill requests, have your pharmacy contact our office and allow 72 hours.    Due to Covid, you will need to wear a mask upon entering the hospital. If you do not have a mask, a mask  will be given to you at the Main Entrance upon arrival. For doctor visits, patients may have 1 support person age 55 or older with them. For treatment visits, patients can not have anyone with them due to social distancing guidelines and our immunocompromised population.

## 2021-05-20 ENCOUNTER — Other Ambulatory Visit: Payer: Self-pay | Admitting: Cardiology

## 2021-06-04 ENCOUNTER — Other Ambulatory Visit: Payer: Self-pay | Admitting: Cardiology

## 2021-06-28 ENCOUNTER — Other Ambulatory Visit: Payer: Self-pay | Admitting: Cardiology

## 2021-07-08 ENCOUNTER — Telehealth: Payer: Self-pay | Admitting: Cardiology

## 2021-07-08 MED ORDER — CARVEDILOL 3.125 MG PO TABS: 3.1250 mg | ORAL_TABLET | Freq: Two times a day (BID) | ORAL | 0 refills | Status: DC

## 2021-07-08 NOTE — Telephone Encounter (Signed)
Refilled per patient request. 

## 2021-07-08 NOTE — Telephone Encounter (Signed)
?*  STAT* If patient is at the pharmacy, call can be transferred to refill team. ? ? ?1. Which medications need to be refilled? (please list name of each medication and dose if known)  ?carvedilol (COREG) 3.125 MG tablet ? ?2. Which pharmacy/location (including street and city if local pharmacy) is medication to be sent to? ?[id number] - [place], [place] ? ? ?3. Do they need a 30 day or 90 day supply?  ? ?90 day supply ? ?

## 2021-07-17 ENCOUNTER — Encounter (HOSPITAL_COMMUNITY): Payer: Self-pay | Admitting: *Deleted

## 2021-07-17 ENCOUNTER — Inpatient Hospital Stay (HOSPITAL_COMMUNITY)
Admission: EM | Admit: 2021-07-17 | Discharge: 2021-07-22 | DRG: 175 | Disposition: A | Discharge status: Home Health | Payer: Medicare HMO | Attending: Internal Medicine | Admitting: Internal Medicine

## 2021-07-17 ENCOUNTER — Emergency Department (HOSPITAL_COMMUNITY): Payer: Medicare HMO

## 2021-07-17 ENCOUNTER — Other Ambulatory Visit: Payer: Self-pay

## 2021-07-17 DIAGNOSIS — Z79899 Other long term (current) drug therapy: Secondary | ICD-10-CM

## 2021-07-17 DIAGNOSIS — Z96641 Presence of right artificial hip joint: Secondary | ICD-10-CM | POA: Diagnosis present

## 2021-07-17 DIAGNOSIS — I2609 Other pulmonary embolism with acute cor pulmonale: Secondary | ICD-10-CM | POA: Diagnosis not present

## 2021-07-17 DIAGNOSIS — Z96612 Presence of left artificial shoulder joint: Secondary | ICD-10-CM | POA: Diagnosis present

## 2021-07-17 DIAGNOSIS — E11649 Type 2 diabetes mellitus with hypoglycemia without coma: Secondary | ICD-10-CM | POA: Diagnosis not present

## 2021-07-17 DIAGNOSIS — K219 Gastro-esophageal reflux disease without esophagitis: Secondary | ICD-10-CM | POA: Diagnosis present

## 2021-07-17 DIAGNOSIS — R918 Other nonspecific abnormal finding of lung field: Secondary | ICD-10-CM | POA: Diagnosis not present

## 2021-07-17 DIAGNOSIS — Z6841 Body Mass Index (BMI) 40.0 and over, adult: Secondary | ICD-10-CM | POA: Diagnosis not present

## 2021-07-17 DIAGNOSIS — I700 Atherosclerosis of aorta: Secondary | ICD-10-CM | POA: Diagnosis not present

## 2021-07-17 DIAGNOSIS — Z833 Family history of diabetes mellitus: Secondary | ICD-10-CM

## 2021-07-17 DIAGNOSIS — E1140 Type 2 diabetes mellitus with diabetic neuropathy, unspecified: Secondary | ICD-10-CM | POA: Diagnosis not present

## 2021-07-17 DIAGNOSIS — Z923 Personal history of irradiation: Secondary | ICD-10-CM

## 2021-07-17 DIAGNOSIS — I350 Nonrheumatic aortic (valve) stenosis: Secondary | ICD-10-CM | POA: Diagnosis not present

## 2021-07-17 DIAGNOSIS — I110 Hypertensive heart disease with heart failure: Secondary | ICD-10-CM | POA: Diagnosis present

## 2021-07-17 DIAGNOSIS — M25561 Pain in right knee: Secondary | ICD-10-CM | POA: Diagnosis present

## 2021-07-17 DIAGNOSIS — Z87891 Personal history of nicotine dependence: Secondary | ICD-10-CM | POA: Diagnosis not present

## 2021-07-17 DIAGNOSIS — E876 Hypokalemia: Secondary | ICD-10-CM | POA: Diagnosis not present

## 2021-07-17 DIAGNOSIS — I5082 Biventricular heart failure: Secondary | ICD-10-CM | POA: Diagnosis present

## 2021-07-17 DIAGNOSIS — R0902 Hypoxemia: Secondary | ICD-10-CM | POA: Diagnosis not present

## 2021-07-17 DIAGNOSIS — D509 Iron deficiency anemia, unspecified: Secondary | ICD-10-CM | POA: Diagnosis not present

## 2021-07-17 DIAGNOSIS — Z9884 Bariatric surgery status: Secondary | ICD-10-CM | POA: Diagnosis not present

## 2021-07-17 DIAGNOSIS — E66813 Obesity, class 3: Secondary | ICD-10-CM | POA: Diagnosis present

## 2021-07-17 DIAGNOSIS — I2729 Other secondary pulmonary hypertension: Secondary | ICD-10-CM | POA: Diagnosis present

## 2021-07-17 DIAGNOSIS — I10 Essential (primary) hypertension: Secondary | ICD-10-CM | POA: Diagnosis present

## 2021-07-17 DIAGNOSIS — I083 Combined rheumatic disorders of mitral, aortic and tricuspid valves: Secondary | ICD-10-CM | POA: Diagnosis present

## 2021-07-17 DIAGNOSIS — R0609 Other forms of dyspnea: Secondary | ICD-10-CM | POA: Diagnosis not present

## 2021-07-17 DIAGNOSIS — J9601 Acute respiratory failure with hypoxia: Secondary | ICD-10-CM | POA: Diagnosis not present

## 2021-07-17 DIAGNOSIS — Z9221 Personal history of antineoplastic chemotherapy: Secondary | ICD-10-CM | POA: Diagnosis not present

## 2021-07-17 DIAGNOSIS — I5032 Chronic diastolic (congestive) heart failure: Secondary | ICD-10-CM | POA: Diagnosis present

## 2021-07-17 DIAGNOSIS — I2699 Other pulmonary embolism without acute cor pulmonale: Secondary | ICD-10-CM | POA: Diagnosis not present

## 2021-07-17 DIAGNOSIS — Z85118 Personal history of other malignant neoplasm of bronchus and lung: Secondary | ICD-10-CM | POA: Diagnosis not present

## 2021-07-17 DIAGNOSIS — E1159 Type 2 diabetes mellitus with other circulatory complications: Secondary | ICD-10-CM | POA: Diagnosis not present

## 2021-07-17 DIAGNOSIS — R0602 Shortness of breath: Secondary | ICD-10-CM | POA: Diagnosis not present

## 2021-07-17 DIAGNOSIS — R600 Localized edema: Secondary | ICD-10-CM | POA: Diagnosis not present

## 2021-07-17 DIAGNOSIS — I2510 Atherosclerotic heart disease of native coronary artery without angina pectoris: Secondary | ICD-10-CM | POA: Diagnosis present

## 2021-07-17 DIAGNOSIS — D329 Benign neoplasm of meninges, unspecified: Secondary | ICD-10-CM | POA: Diagnosis present

## 2021-07-17 DIAGNOSIS — Z7982 Long term (current) use of aspirin: Secondary | ICD-10-CM

## 2021-07-17 DIAGNOSIS — I2693 Single subsegmental pulmonary embolism without acute cor pulmonale: Secondary | ICD-10-CM | POA: Diagnosis not present

## 2021-07-17 DIAGNOSIS — Z8249 Family history of ischemic heart disease and other diseases of the circulatory system: Secondary | ICD-10-CM

## 2021-07-17 DIAGNOSIS — E119 Type 2 diabetes mellitus without complications: Secondary | ICD-10-CM

## 2021-07-17 LAB — CBC WITH DIFFERENTIAL/PLATELET
Abs Immature Granulocytes: 0.01 10*3/uL (ref 0.00–0.07)
Basophils Absolute: 0.1 10*3/uL (ref 0.0–0.1)
Basophils Relative: 1 %
Eosinophils Absolute: 0.1 10*3/uL (ref 0.0–0.5)
Eosinophils Relative: 1 %
HCT: 40.8 % (ref 36.0–46.0)
Hemoglobin: 12.9 g/dL (ref 12.0–15.0)
Immature Granulocytes: 0 %
Lymphocytes Relative: 13 %
Lymphs Abs: 0.7 10*3/uL (ref 0.7–4.0)
MCH: 27.4 pg (ref 26.0–34.0)
MCHC: 31.6 g/dL (ref 30.0–36.0)
MCV: 86.6 fL (ref 80.0–100.0)
Monocytes Absolute: 0.4 10*3/uL (ref 0.1–1.0)
Monocytes Relative: 7 %
Neutro Abs: 4.7 10*3/uL (ref 1.7–7.7)
Neutrophils Relative %: 78 %
Platelets: 366 10*3/uL (ref 150–400)
RBC: 4.71 MIL/uL (ref 3.87–5.11)
RDW: 15.8 % — ABNORMAL HIGH (ref 11.5–15.5)
WBC: 5.9 10*3/uL (ref 4.0–10.5)
nRBC: 0 % (ref 0.0–0.2)

## 2021-07-17 LAB — COMPREHENSIVE METABOLIC PANEL
ALT: 45 U/L — ABNORMAL HIGH (ref 0–44)
AST: 29 U/L (ref 15–41)
Albumin: 3.7 g/dL (ref 3.5–5.0)
Alkaline Phosphatase: 93 U/L (ref 38–126)
Anion gap: 9 (ref 5–15)
BUN: 10 mg/dL (ref 8–23)
CO2: 22 mmol/L (ref 22–32)
Calcium: 8.9 mg/dL (ref 8.9–10.3)
Chloride: 111 mmol/L (ref 98–111)
Creatinine, Ser: 0.93 mg/dL (ref 0.44–1.00)
GFR, Estimated: >60 mL/min (ref >60)
Glucose, Bld: 119 mg/dL — ABNORMAL HIGH (ref 70–99)
Potassium: 3.1 mmol/L — ABNORMAL LOW (ref 3.5–5.1)
Sodium: 142 mmol/L (ref 135–145)
Total Bilirubin: 0.4 mg/dL (ref 0.3–1.2)
Total Protein: 6.6 g/dL (ref 6.5–8.1)

## 2021-07-17 LAB — GLUCOSE, CAPILLARY: Glucose-Capillary: 120 mg/dL — ABNORMAL HIGH (ref 70–99)

## 2021-07-17 LAB — MRSA NEXT GEN BY PCR, NASAL: MRSA by PCR Next Gen: NOT DETECTED

## 2021-07-17 LAB — BRAIN NATRIURETIC PEPTIDE: B Natriuretic Peptide: 554 pg/mL — ABNORMAL HIGH (ref 0.0–100.0)

## 2021-07-17 LAB — TROPONIN I (HIGH SENSITIVITY)
Troponin I (High Sensitivity): 56 ng/L — ABNORMAL HIGH (ref <18)
Troponin I (High Sensitivity): 60 ng/L — ABNORMAL HIGH (ref <18)

## 2021-07-17 IMAGING — CT CT CHEST W/ CM
2 of 4 series · 14 of 36 positions shown, 17 images · IV contrast (Omnipaque or Isovue)
Comparison: [DATE].

CLINICAL DATA: A 70-year-old female presents with history of
non-small cell lung cancer. Shortness of breath on exertion for the
past few days.

Insert body on
EXAM:
CT CHEST WITH CONTRAST
TECHNIQUE: Multidetector CT imaging of the chest was performed during
intravenous contrast administration.

[Series 2: routine chest with (person_name) · axial · 0.82mm/px · z∈[+1071,+1357]mm · 11 of 169 slices shown, 14 images]
[im 13/169  mediastinal]
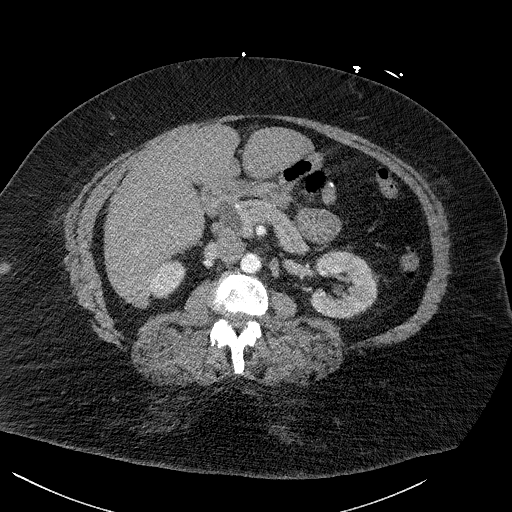
[im 13/169  lung]
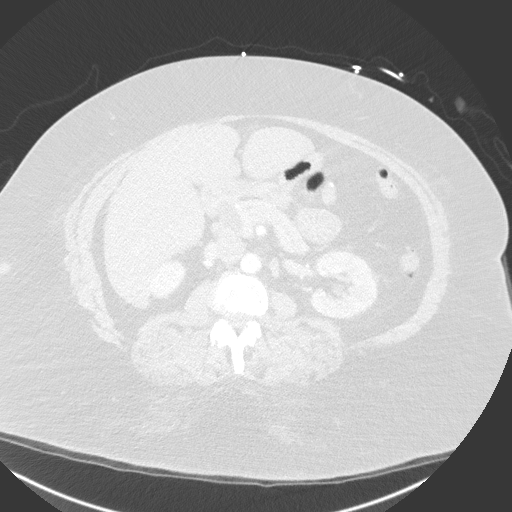
[im 26/169  lung]
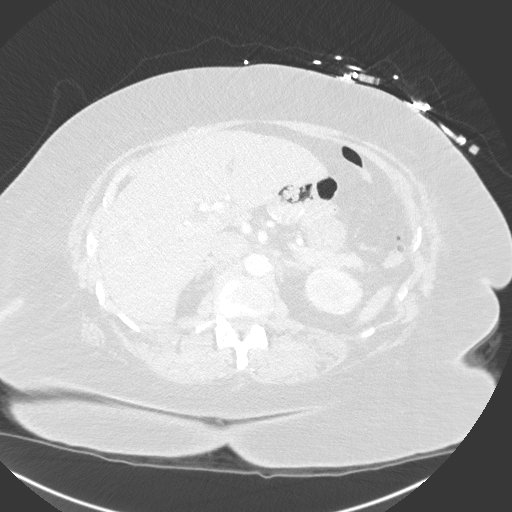
[im 39/169  lung]
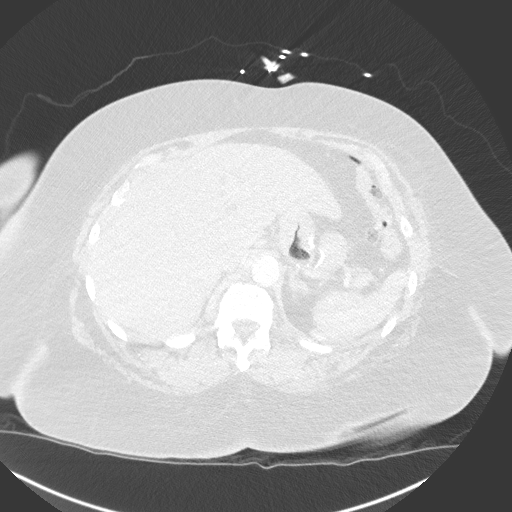
[im 52/169  lung]
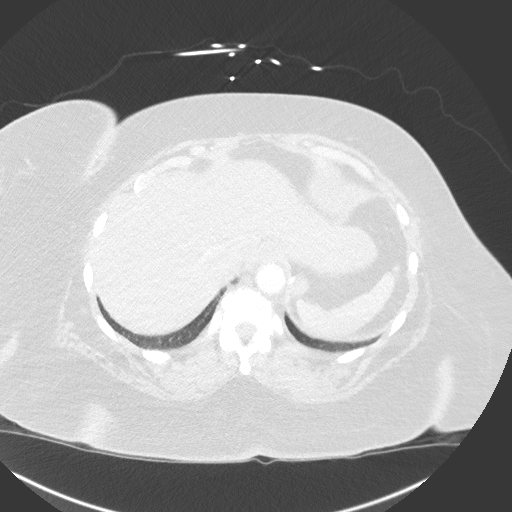
[im 65/169  mediastinal]
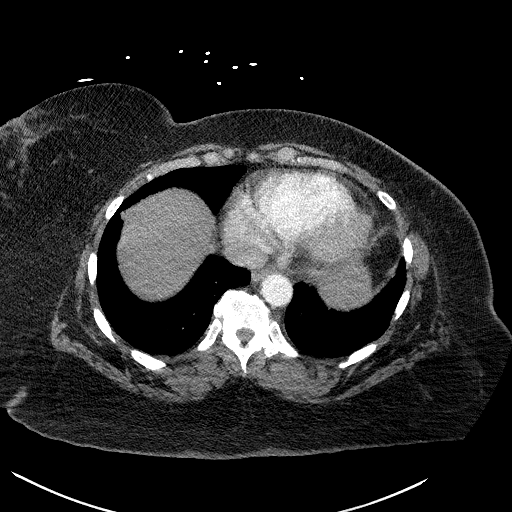
[im 65/169  lung]
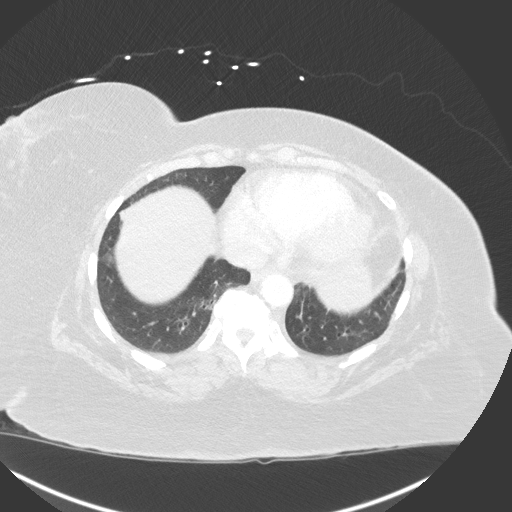
[im 91/169  lung]
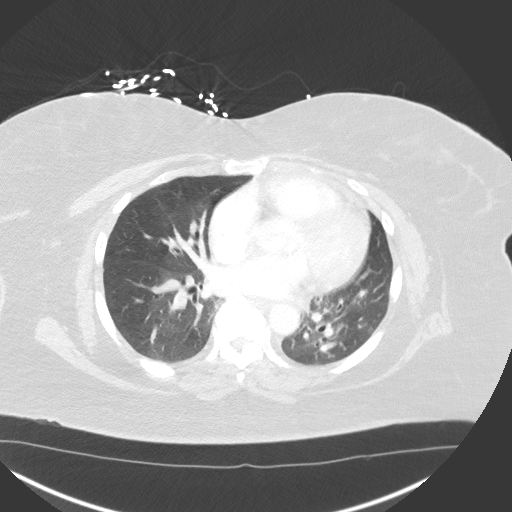
[im 104/169  lung]
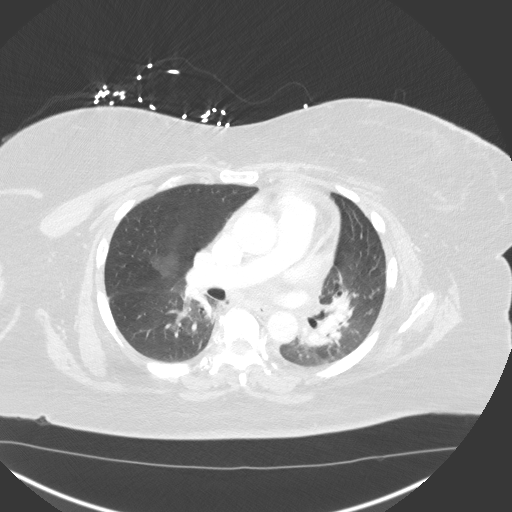
[im 117/169  lung]
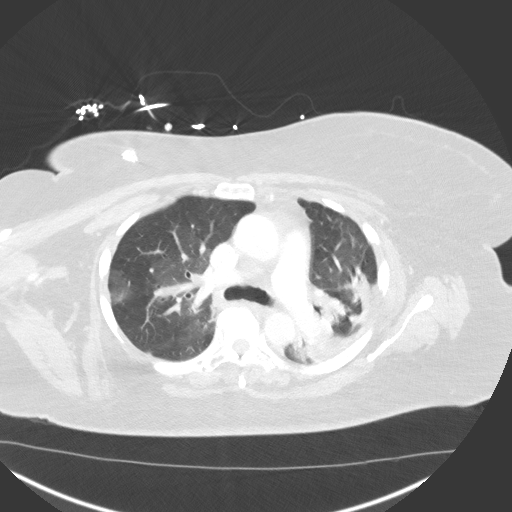
[im 130/169  mediastinal]
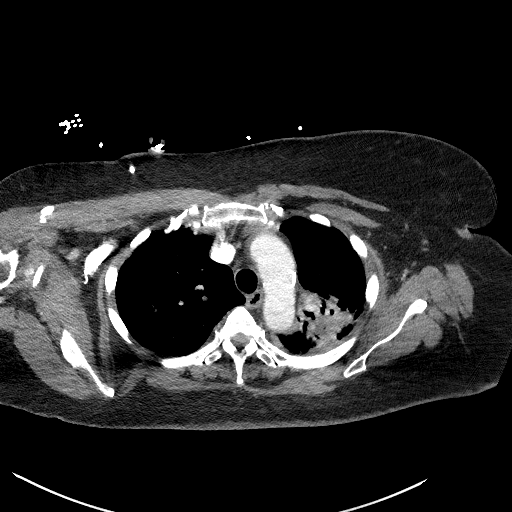
[im 130/169  lung]
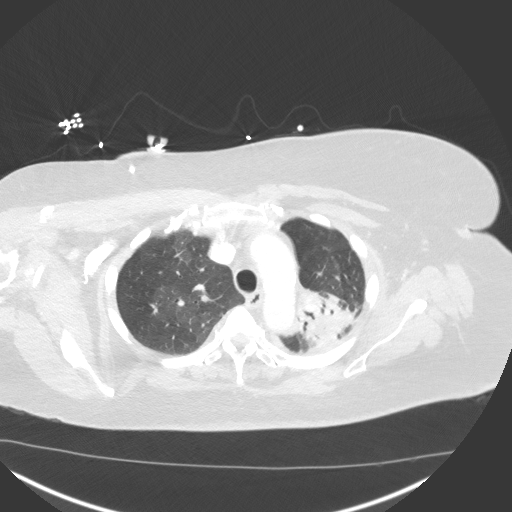
[im 143/169  lung]
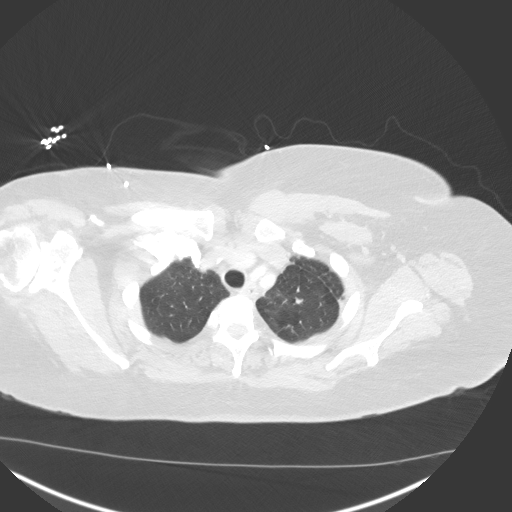
[im 156/169  lung]
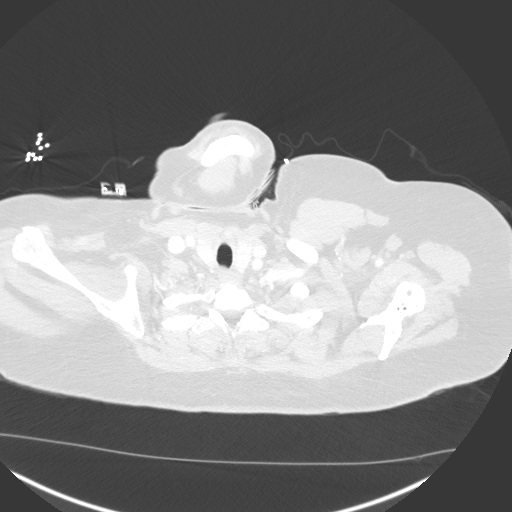

[Series 5: coronal (person_name) · coronal · 0.72mm/px · 3 of 182 slices shown]
[im 37/182  lung]
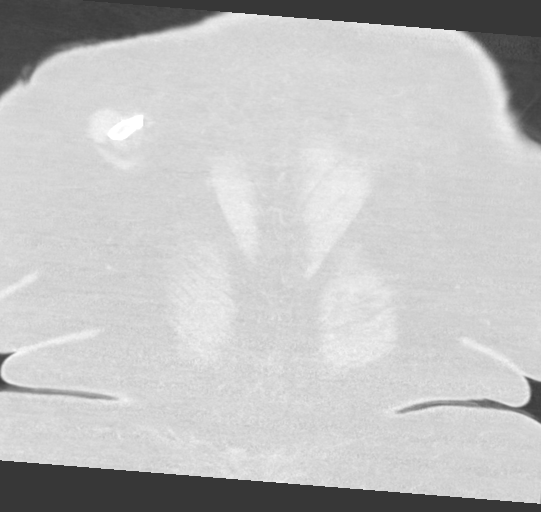
[im 73/182  lung]
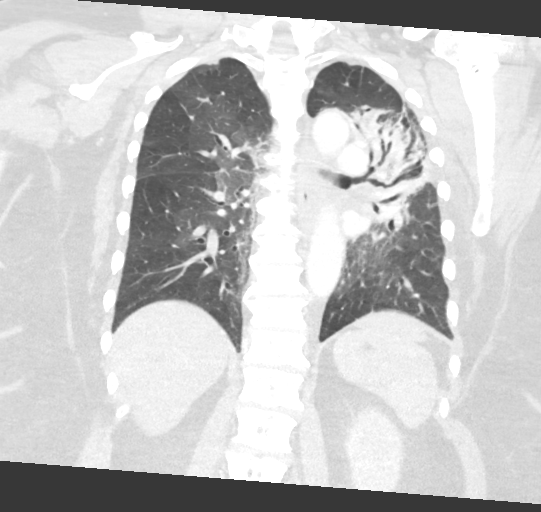
[im 109/182  lung]
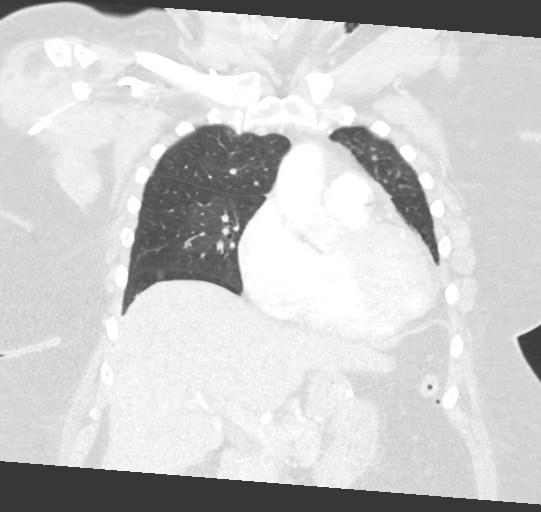

[14 of 36 positions shown; findings below may reference images not displayed]

RADIATION DOSE REDUCTION: This exam was performed according to the
departmental dose-optimization program which includes automated
exposure control, adjustment of the mA and/or kV according to
patient size and/or use of iterative reconstruction technique.

CONTRAST:  75mL OMNIPAQUE IOHEXOL 300 MG/ML  SOLN
FINDINGS: Cardiovascular:

Increased RV to LV ratio at 1.8.  No pericardial effusion.

Aortic caliber is normal with signs of calcified and noncalcified
aortic atherosclerotic plaque.

Central pulmonary arteries are normal caliber opacified 195
Hounsfield units.

Occlusive thrombi are present in segmental branches of the RIGHT
lower and anterior RIGHT upper lobe. Nonocclusive embolus in the
RIGHT lower lobe and RIGHT middle lobe lobar level branches and in
RIGHT upper lobe branches.

No large thrombus in the main pulmonary artery on the LEFT or RIGHT.
Perhaps small emboli also in the LEFT lower lobe at the segmental
level.

Mediastinum/Nodes: No mediastinal adenopathy. No thoracic inlet
adenopathy. No axillary lymphadenopathy. No hilar lymphadenopathy.
RIGHT-sided Port-A-Cath terminates at the distal aspect of the
superior vena cava.

Lungs/Pleura: Patchy, subtle ground-glass is present scattered
throughout the chest more pronounced in the RIGHT chest. Discrete
subsolid nodule measuring 11 x 9 mm. (Image 71/4) in the superior
segment of the RIGHT lower lobe.

Stable appearance of consolidative post treatment changes in the
LEFT chest in the LEFT mid to upper chest extending from the LEFT
hilum. No pleural effusion. No pneumothorax.

Upper Abdomen: Hypodense lesion in the RIGHT hepatic lobe appears
more conspicuous than on previous imaging from [DATE] and
appears new compared to remote imaging from [DATE]
measuring 20 mm (image 143/2). Additional small lesion seen just
inferior to this may also be new. Subtle scattered foci of low
attenuation are suggested elsewhere in the liver. Lobular hepatic
contours and fissural widening. Stable low-density lesion in the
medial segment of the LEFT hepatic lobe compatible with a cyst. Post
cholecystectomy with stable biliary duct dilation both intra and
extrahepatic. Liver is incompletely imaged. Imaged portions of
pancreas, spleen, adrenal glands and kidneys without acute process.
Post gastric bypass. No acute findings related to the
gastrointestinal tract to the extent evaluated.

Musculoskeletal: No acute bone finding. No destructive bone process.
Spinal degenerative changes. Post LEFT shoulder arthroplasty
incompletely evaluated.
IMPRESSION: 1. Positive for acute PE occlusive at the segmental level,
nonocclusive at the lobar level in the RIGHT lower lobe with emboli
also in the upper and middle lobe on the RIGHT. Above findings are
associated with CT evidence of right heart strain (RV/LV Ratio =
1.8) consistent with at least submassive (intermediate risk) PE. The
presence of right heart strain has been associated with an increased
risk of morbidity and mortality. Please activate Code PE / PE
Focused [place] set in [REDACTED] for further guidance.
2. Scattered areas of ground-glass potentially related to subtle
areas of infarct. Some areas of nodularity as outlined above that
warrant attention on follow-up.
3. Suspected new areas of hepatic metastatic disease largest best
seen on coronal image 82/5 measuring up to 2 cm in the RIGHT hepatic
lobe. Consider MRI for further evaluation when the patient is able,
preferably as an outpatient when they were able to cooperate for the
examination and perform breath holding maneuvers.

Aortic Atherosclerosis ([83]-[83]).

Critical Value/emergent results were called by telephone at the time
of interpretation on [DATE] at [DATE] to provider Dr. ALLAN GOABAONE,
Who verbally acknowledged these results.

## 2021-07-17 IMAGING — DX DG CHEST 1V PORT
1 series · 1 of 1 positions shown · non-contrast
Comparison: [DATE]

CLINICAL DATA: Shortness of breath and hypoxia.

EXAM:
PORTABLE CHEST 1 VIEW

[chest ap]
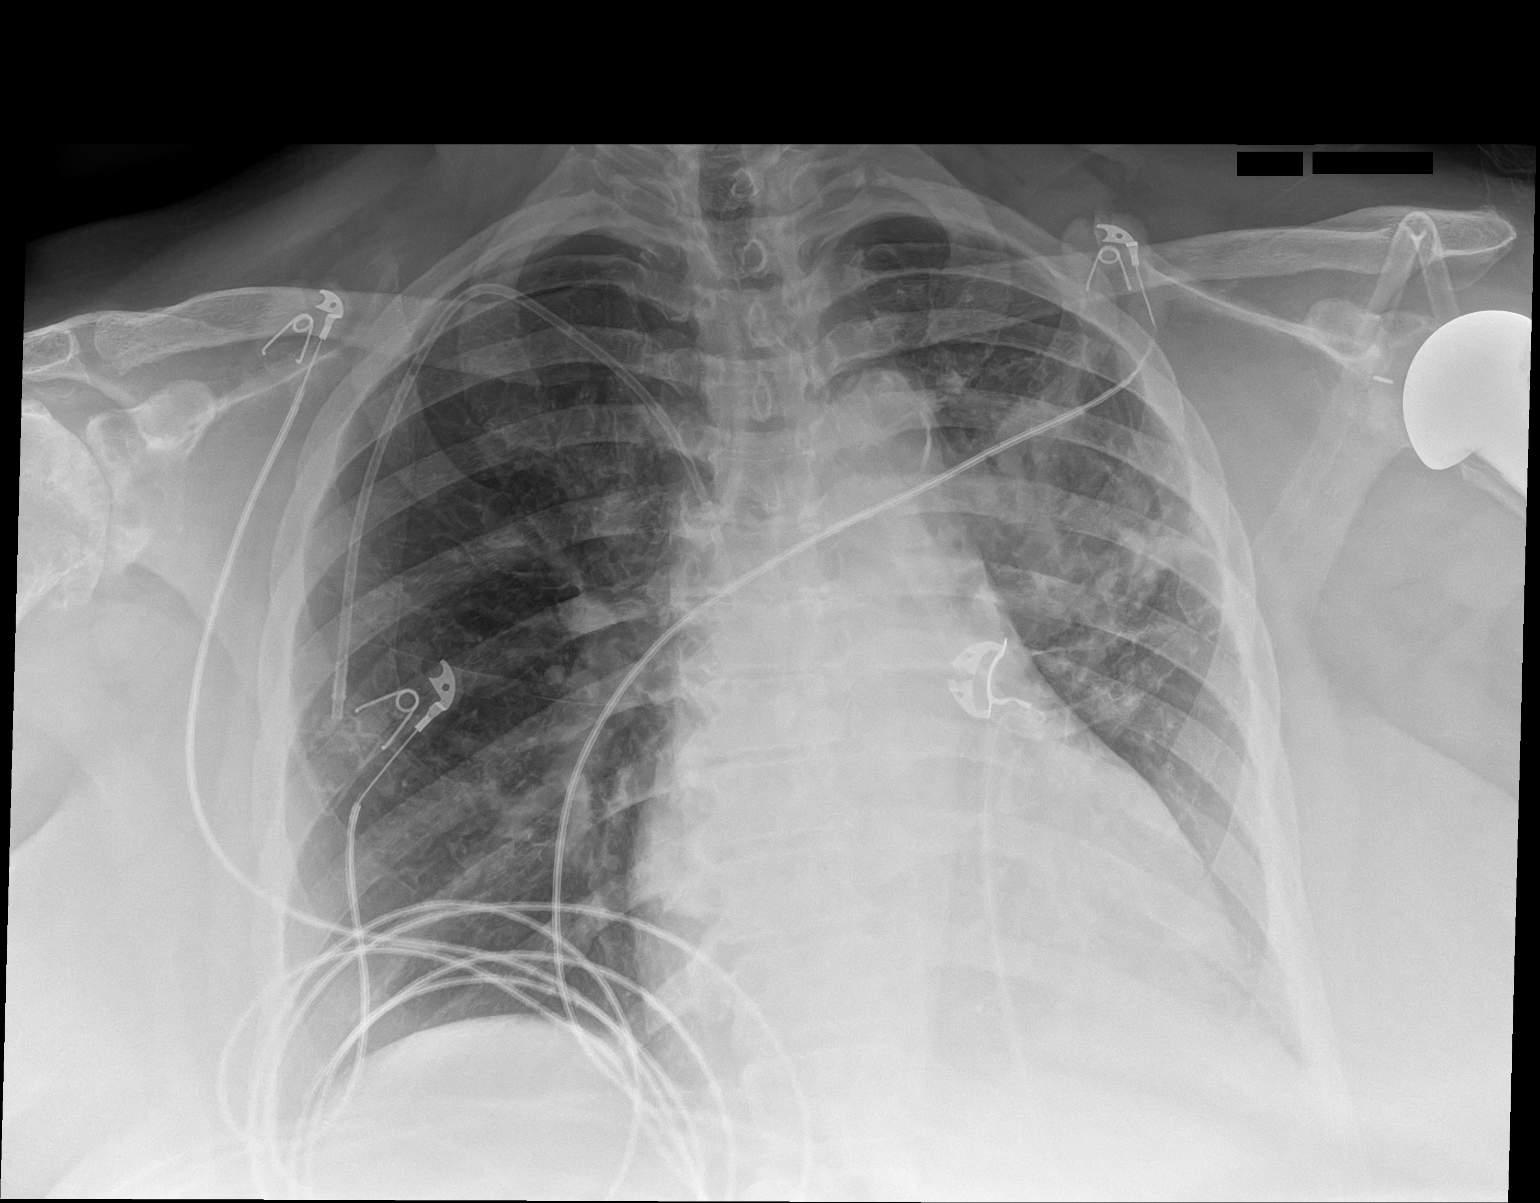

[1 of 1 positions shown; findings below may reference images not displayed]

FINDINGS: [LB] hours. The cardio pericardial silhouette is enlarged. Confluent
masslike lesion seen in the left mid lung previously is decreased in
the interval with more patchy left lung opacity in this region
today, consistent with post treatment scarring noted on multiple
intervening chest CTs. Probable small volume retrocardiac
atelectasis at the left base. No pulmonary edema. Right Port-A-Cath
remains in place. Telemetry leads overlie the chest.
IMPRESSION: Post treatment scarring left mid lung with probable retrocardiac
atelectasis at the left base.

## 2021-07-17 MED ORDER — ALBUTEROL SULFATE (2.5 MG/3ML) 0.083% IN NEBU: 3.0000 mL | INHALATION_SOLUTION | RESPIRATORY_TRACT | Status: DC | PRN

## 2021-07-17 MED ORDER — IOHEXOL 300 MG/ML  SOLN
75.0000 mL | Freq: Once | INTRAMUSCULAR | Status: AC | PRN
  Administered 2021-07-17: 75 mL via INTRAVENOUS

## 2021-07-17 MED ORDER — INSULIN ASPART 100 UNIT/ML IJ SOLN
0.0000 [IU] | Freq: Three times a day (TID) | INTRAMUSCULAR | Status: DC
  Administered 2021-07-19: 1 [IU] via SUBCUTANEOUS

## 2021-07-17 MED ORDER — ONDANSETRON HCL 4 MG PO TABS: 4.0000 mg | ORAL_TABLET | Freq: Four times a day (QID) | ORAL | Status: DC | PRN

## 2021-07-17 MED ORDER — FERROUS SULFATE 325 (65 FE) MG PO TABS
325.0000 mg | ORAL_TABLET | Freq: Every evening | ORAL | Status: DC
  Administered 2021-07-18 – 2021-07-21 (×4): 325 mg via ORAL
  Filled 2021-07-17 (×5): qty 1

## 2021-07-17 MED ORDER — POLYVINYL ALCOHOL 1.4 % OP SOLN: 1.0000 [drp] | Freq: Three times a day (TID) | OPHTHALMIC | Status: DC | PRN

## 2021-07-17 MED ORDER — ORAL CARE MOUTH RINSE
15.0000 mL | Freq: Two times a day (BID) | OROMUCOSAL | Status: DC
  Administered 2021-07-18 – 2021-07-22 (×8): 15 mL via OROMUCOSAL

## 2021-07-17 MED ORDER — ACETAMINOPHEN 325 MG PO TABS: 650.0000 mg | ORAL_TABLET | Freq: Four times a day (QID) | ORAL | Status: DC | PRN

## 2021-07-17 MED ORDER — CARVEDILOL 3.125 MG PO TABS
3.1250 mg | ORAL_TABLET | Freq: Two times a day (BID) | ORAL | Status: DC
  Administered 2021-07-17 – 2021-07-18 (×3): 3.125 mg via ORAL
  Filled 2021-07-17 (×4): qty 1

## 2021-07-17 MED ORDER — POTASSIUM CHLORIDE CRYS ER 20 MEQ PO TBCR
40.0000 meq | EXTENDED_RELEASE_TABLET | ORAL | Status: AC
  Administered 2021-07-17 – 2021-07-18 (×2): 40 meq via ORAL
  Filled 2021-07-17 (×2): qty 2

## 2021-07-17 MED ORDER — HYDROCODONE-ACETAMINOPHEN 5-325 MG PO TABS: 1.0000 | ORAL_TABLET | Freq: Two times a day (BID) | ORAL | Status: DC | PRN

## 2021-07-17 MED ORDER — HEPARIN (PORCINE) 25000 UT/250ML-% IV SOLN
1700.0000 [IU]/h | INTRAVENOUS | Status: DC
  Administered 2021-07-17: 1300 [IU]/h via INTRAVENOUS
  Administered 2021-07-18: 1700 [IU]/h via INTRAVENOUS
  Filled 2021-07-17 (×2): qty 250

## 2021-07-17 MED ORDER — FUROSEMIDE 40 MG PO TABS: 40.0000 mg | ORAL_TABLET | Freq: Every day | ORAL | Status: DC

## 2021-07-17 MED ORDER — HEPARIN BOLUS VIA INFUSION
4500.0000 [IU] | Freq: Once | INTRAVENOUS | Status: AC
  Administered 2021-07-17: 4500 [IU] via INTRAVENOUS

## 2021-07-17 MED ORDER — ACETAMINOPHEN 650 MG RE SUPP: 650.0000 mg | Freq: Four times a day (QID) | RECTAL | Status: DC | PRN

## 2021-07-17 MED ORDER — GABAPENTIN 300 MG PO CAPS
300.0000 mg | ORAL_CAPSULE | Freq: Two times a day (BID) | ORAL | Status: DC
  Administered 2021-07-17 – 2021-07-22 (×10): 300 mg via ORAL
  Filled 2021-07-17 (×10): qty 1

## 2021-07-17 MED ORDER — ADULT MULTIVITAMIN W/MINERALS CH
1.0000 | ORAL_TABLET | Freq: Every day | ORAL | Status: DC
  Administered 2021-07-18 – 2021-07-22 (×5): 1 via ORAL
  Filled 2021-07-17 (×5): qty 1

## 2021-07-17 MED ORDER — DIPHENHYDRAMINE HCL 25 MG PO CAPS: 25.0000 mg | ORAL_CAPSULE | Freq: Four times a day (QID) | ORAL | Status: DC | PRN

## 2021-07-17 MED ORDER — ONDANSETRON HCL 4 MG/2ML IJ SOLN: 4.0000 mg | Freq: Four times a day (QID) | INTRAMUSCULAR | Status: DC | PRN

## 2021-07-17 MED ORDER — PANTOPRAZOLE SODIUM 40 MG PO TBEC
40.0000 mg | DELAYED_RELEASE_TABLET | Freq: Every day | ORAL | Status: DC
  Administered 2021-07-18 – 2021-07-22 (×5): 40 mg via ORAL
  Filled 2021-07-17 (×5): qty 1

## 2021-07-17 MED ORDER — CELECOXIB 100 MG PO CAPS
200.0000 mg | ORAL_CAPSULE | Freq: Two times a day (BID) | ORAL | Status: DC
  Administered 2021-07-17 – 2021-07-22 (×10): 200 mg via ORAL
  Filled 2021-07-17: qty 2
  Filled 2021-07-17: qty 1
  Filled 2021-07-17: qty 2
  Filled 2021-07-17: qty 1
  Filled 2021-07-17: qty 2
  Filled 2021-07-17: qty 1
  Filled 2021-07-17: qty 2
  Filled 2021-07-17 (×2): qty 1
  Filled 2021-07-17: qty 2
  Filled 2021-07-17: qty 1
  Filled 2021-07-17 (×3): qty 2

## 2021-07-17 MED ORDER — FUROSEMIDE 10 MG/ML IJ SOLN
40.0000 mg | Freq: Once | INTRAMUSCULAR | Status: AC
  Administered 2021-07-17: 40 mg via INTRAVENOUS
  Filled 2021-07-17: qty 4

## 2021-07-17 NOTE — Assessment & Plan Note (Addendum)
-  Stage 3 b left adenocarcinoma.  ?-Sp chemotherapy and radiation. ?-Last follow up with no signs of metastasis. ?-CT chest at time of admission demonstrating bilateral pulmonary embolism and suggesting concerns for metastatic lesion to the liver. ?-Outpatient MRI and follow-up with oncology service recommended. ?

## 2021-07-17 NOTE — Assessment & Plan Note (Addendum)
-  With prior history of diabetic neuropathy; continue the use of gabapentin. ?-Continue sliding scale insulin and modify carbohydrate diet. ?-Follow CBGs and adjust hypoglycemic regimen as needed. ? ? ?

## 2021-07-17 NOTE — Assessment & Plan Note (Addendum)
-  Body mass index is 47.46 kg/m?. ?-Low calorie diet, portion control and increase physical activity discussed with patient. ?

## 2021-07-17 NOTE — Assessment & Plan Note (Addendum)
-  Acute pulmonary embolism with possible pulmonary infarcts; patient with acute respiratory hypoxia and the presence of right heart failure..  ?-CT with evidence of right heart strain and 2D echo confirming right ventricular failure with pulmonary hypertension. ?-Case discussed with PCCM; at this point recommended continue IV heparin drip for a total of 5 days prior to transition to oral anticoagulation therapy.  Patient will need outpatient repeat echo in 21-month. ?-Lower extremity Dopplers negative for DVT ?-Planning to use Eliquis at discharge; given prior history of lung cancer with very likely hypercoagulable state plan is for indefinite management. ?

## 2021-07-17 NOTE — H&P (Signed)
?History and Physical  ? ? ?Patient: Denise [name] OFB:[id number] DOB: 1950-12-28 ?DOA: 07/17/2021 ?DOS: the patient was seen and examined on 07/17/2021 ?PCP: Iona Beard, MD  ?Patient coming from: Home ? ?Chief Complaint:  ?Chief Complaint  ?Patient presents with  ? Shortness of Breath  ? ?HPI: Denise [name] is a 71 y.o. female with medical history significant of lung cancer sp chemotherapy and radiation who presented with dyspnea. Reported 2 days of worsening dyspnea on exertion, to the point where she is symptomatic with minimal efforts, no chest pain, cough or hemoptysis.  ?Patient walks with the help of a cane because right knee pain, able to do activities of daily living when at baseline. 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  ? ?Positive chronic lower extremity edema that seems to be worse at the end of the day and improved with leg elevation.  ? ?She denies any thrombosis in the past. No recent change in her medications.  ?Last oncology follow up on 04/2021 with no signs of metastatic disease in the chest.  ? ?  ?Review of Systems: As mentioned in the history of present illness. All other systems reviewed and are negative. ?Past Medical History:  ?Diagnosis Date  ? Anemia   ? Aortic stenosis   ? Arthritis   ? Cancer [facility])   ? Lung  ? Coronary artery calcification seen on CT scan   ? Essential hypertension   ? GERD (gastroesophageal reflux disease)   ? History of lung cancer   ? Stage III adenocarcinoma status post chemoradiation  ? Port-A-Cath in place 01/06/2019  ? Type 2 diabetes mellitus ([place])   ? ?Past Surgical History:  ?Procedure Laterality Date  ? CHOLECYSTECTOMY  1997  ? COLONOSCOPY    ? GASTRIC BYPASS    ? INCISIONAL HERNIA REPAIR  04/11/11  ? IR IMAGING GUIDED PORT INSERTION  12/27/2018  ? Right  ? LAPAROSCOPIC SALPINGOOPHERECTOMY    ? LAPAROTOMY  04/11/2011  ? Procedure: EXPLORATORY LAPAROTOMY;  Surgeon: Joyice Faster. Cornett, MD;   Location: [facility];  Service: General;  Laterality: N/A;  closure port hole  ? RIGHT/LEFT HEART CATH AND CORONARY ANGIOGRAPHY N/A 12/29/2019  ? Procedure: RIGHT/LEFT HEART CATH AND CORONARY ANGIOGRAPHY;  Surgeon: Troy Sine, MD;  Location: [place] [facility];  Service: Cardiovascular;  Laterality: N/A;  ? TOTAL HIP ARTHROPLASTY  03/07/2012  ? Procedure: TOTAL HIP ARTHROPLASTY ANTERIOR APPROACH;  Surgeon: Mauri Pole, MD;  Location: [facility];  Service: Orthopedics;  Laterality: Right;  ? TOTAL SHOULDER ARTHROPLASTY Left 01/21/2015  ? TOTAL SHOULDER ARTHROPLASTY Left 01/21/2015  ? Procedure: LEFT TOTAL SHOULDER ARTHROPLASTY;  Surgeon: Justice Britain, MD;  Location: [place];  Service: Orthopedics;  Laterality: Left;  ? VAGINAL HYSTERECTOMY    ? ?Social History:  reports that she quit smoking about 45 years ago. Her smoking use included cigarettes. She has a 12.00 pack-year smoking history. She has never used smokeless tobacco. She reports that she does not drink alcohol and does not use drugs. ? ?No Known Allergies ? ?Family History  ?Problem Relation Age of Onset  ? Breast cancer Mother   ? COPD Mother   ? Arthritis Mother   ? Diabetes Mother   ? Hypertension Mother   ? Hypertension Father   ? Diabetes Father   ? Breast cancer Sister   ? Thyroid cancer Brother   ? Huntington's disease Maternal Grandmother   ?  Heart attack Brother   ? ? ?Prior to Admission medications   ?Medication Sig Start Date End Date Taking? Authorizing Provider  ?albuterol (VENTOLIN HFA) 108 (90 Base) MCG/ACT inhaler TAKE 2 PUFFS BY MOUTH EVERY 6 HOURS AS NEEDED FOR WHEEZE OR SHORTNESS OF BREATH ?Patient taking differently: Inhale 2 puffs into the lungs every 6 (six) hours as needed for wheezing or shortness of breath. 07/27/20  Yes Derek Jack, MD  ?amLODipine (NORVASC) 5 MG tablet Take 5 mg by mouth daily.    Yes [provider]  ?aspirin EC 81 MG tablet Take 81 mg by mouth daily. Swallow whole.   Yes [provider]   ?Calcium Carbonate Antacid (TUMS PO) Take 4 tablets by mouth daily.   Yes [provider]  ?carvedilol (COREG) 3.125 MG tablet Take 1 tablet (3.125 mg total) by mouth 2 (two) times daily. 07/08/21  Yes Satira Sark, MD  ?Cyanocobalamin (VITAMIN B-12) 2500 MCG SUBL Place 2,500 mcg under the tongue every morning.    Yes [provider]  ?diclofenac (VOLTAREN) 75 MG EC tablet Take 75 mg by mouth 2 (two) times daily. 06/25/21  Yes [provider]  ?diphenhydrAMINE (BENADRYL) 25 MG tablet Take 25 mg by mouth every 6 (six) hours as needed for itching or allergies.   Yes [provider]  ?ferrous sulfate 325 (65 FE) MG tablet Take 325 mg by mouth every evening.   Yes [provider]  ?gabapentin (NEURONTIN) 300 MG capsule Take 300 mg by mouth at bedtime as needed (Nerve Pain).   Yes [provider]  ?Multiple Vitamin (MULITIVITAMIN WITH MINERALS) TABS Take 1 tablet by mouth daily.   Yes [provider]  ?omeprazole (PRILOSEC) 20 MG capsule Take 20 mg by mouth daily. 09/28/18  Yes [provider]  ?Acetaminophen (TYLENOL 8 HOUR ARTHRITIS PAIN PO) Take 650 ng by mouth 2 (two) times daily.  ?Patient not taking: Reported on 07/17/2021    [provider]  ?carboxymethylcellul-glycerin (OPTIVE) 0.5-0.9 % ophthalmic solution Place 1-2 drops into both eyes 3 (three) times daily as needed (dry/irritated eyes.). ?Patient not taking: Reported on 07/17/2021    [provider]  ?[facility] IV Inject into the vein every 14 (fourteen) days. ?Patient not taking: Reported on 07/17/2021 03/19/19   [provider]  ?furosemide (LASIX) 20 MG tablet TAKE 1 TABLET BY MOUTH AS NEEDED FOR FLUID OR EDEMA (DAILY AS NEEDED FOR SWELLING). ?Patient not taking: Reported on 07/17/2021 05/23/20   Derek Jack, MD  ?HYDROcodone-acetaminophen (NORCO/VICODIN) 5-325 MG tablet Take 1 tablet by mouth 2 (two) times daily as needed. ?Patient not taking:  Reported on 07/17/2021 01/12/20   [provider]  ?lidocaine-prilocaine (EMLA) cream APPLY SMALL AMOUNT TO PORT A CATH SITE AND COVER WITH PLASTIC WRAP 1 HR PRIOR TO CHEMOTHERAPY APPTS ?Patient not taking: Reported on 07/17/2021 05/03/20   Derek Jack, MD  ?naproxen (NAPROSYN) 500 MG tablet Take 500 mg by mouth every 12 (twelve) hours.  ?Patient not taking: Reported on 07/17/2021 02/03/19   [provider]  ?prochlorperazine (COMPAZINE) 10 MG tablet Take 1 tablet (10 mg total) by mouth every 6 (six) hours as needed (Nausea or vomiting). 01/06/19 05/29/19  Derek Jack, MD  ? ? ?Physical Exam: ?Vitals:  ? 07/17/21 1730 07/17/21 1745 07/17/21 1800 07/17/21 1901  ?BP: 123/62   (!) 138/47  ?Pulse: 85 85 86 85  ?Resp: 19 (!) 23 (!) 24 16  ?Temp:      ?TempSrc:      ?  SpO2: 93% 96% 95% 97%  ?Weight:      ?Height:      ? ?Neurology awake and alert ?ENT with mild pallor, no icterus ?Cardiovascular with S1 and S2 present and rhythmic with no gallops or rubs, positive systolic murmur 3/6 at the right sternal border ?Respiratory with no rales or wheezing, no rhonchi ?Abdomen with no distention ?Positive lower extremity edema ++ non pitting.  ?Data Reviewed: ? ? ?71 yo female with past medical history of left stage 3b lung adenocarcinoma who presents with worsening dyspnea on exertion. Sedentary lifestyle and no history of thrombosis in the past. On her initial physical examination positive tachypnea and 02 saturation 87% on room air, lungs with no wheezing or rhonchi, heart with S1 and S2 present with positive systolic murmur at the right sternal border, positive lower extremity edema.  ? ?Na 142, K 3,1 CL 111. Bicarbonate 22, glucose 119, bun 10 cr 0.93  ?BNP 554  ?High sensitive troponin 56 and 60  ?Wbc 5,9 hgb 12,9 plt 366  ? ?Chest radiograph with left upper lobe opacity, with no effusions.  ?CT chest with positive acute pulmonary embolism segmental level, non occlusive at the lobar level  right lower lobe. Also emboli in the upper and middle lobe on the right.  ?Bilateral scattered ground glass opacities.  ?Suspected hepatic metastasis.  ? ?EKG 96 bpm, right axis deviation, right bundle branch bl

## 2021-07-17 NOTE — ED Notes (Addendum)
O2 via Start applied at 2 L/M ?

## 2021-07-17 NOTE — Assessment & Plan Note (Addendum)
-  Renal function stable ?-Electrolyte has been repleted and currently within normal limits. ?-Continue to follow trend intermittently. ?

## 2021-07-17 NOTE — Assessment & Plan Note (Addendum)
-  Soft but, stable ?-Continue holding diuretics and also the use of carvedilol at this point.: ?-Heart healthy diet discussed with patient. ?-Follow-up vital signs. ?

## 2021-07-17 NOTE — ED Triage Notes (Signed)
Pt with SOB with exertion for past few days.  ?

## 2021-07-17 NOTE — Assessment & Plan Note (Addendum)
-  With component of chronic and compensated diastolic heart failure.   ?-Repeat echocardiogram demonstrating preserved ejection fraction; right-sided ventricular failure, pulmonary hypertension and dilated right atrium. ?-Continue to follow daily weights, maintain adequate hydration and nutrition. ?-Holding beta-blockers and diuretics in the setting of soft blood pressure currently ?-Resume them once blood pressure stabilized. ?-Continue heart healthy/low-sodium diet ?

## 2021-07-17 NOTE — ED Provider Notes (Signed)
?[place] ?Provider Note ? ? ?CSN: [id number] ?Arrival date & time: 07/17/21  1241 ? ?  ? ?History ? ?Chief Complaint  ?Patient presents with  ? Shortness of Breath  ? ? ?Denise [name] is a 71 y.o. female. ? ?Patient complains of shortness of breath.  Patient has a history of lung cancer.  She also complains of swelling in her legs. ? ?The history is provided by the patient and medical records. No language interpreter was used.  ?Shortness of Breath ?Severity:  Moderate ?Onset quality:  Sudden ?Timing:  Constant ?Progression:  Worsening ?Chronicity:  New ?Context: activity   ?Relieved by:  Nothing ?Worsened by:  Nothing ?Ineffective treatments:  None tried ?Associated symptoms: no abdominal pain, no chest pain, no cough, no headaches and no rash   ? ?  ? ?Home Medications ?Prior to Admission medications   ?Medication Sig Start Date End Date Taking? Authorizing Provider  ?Acetaminophen (TYLENOL 8 HOUR ARTHRITIS PAIN PO) Take 650 ng by mouth 2 (two) times daily.     [provider]  ?albuterol (VENTOLIN HFA) 108 (90 Base) MCG/ACT inhaler TAKE 2 PUFFS BY MOUTH EVERY 6 HOURS AS NEEDED FOR WHEEZE OR SHORTNESS OF BREATH 07/27/20   Derek Jack, MD  ?amLODipine (NORVASC) 5 MG tablet Take 5 mg by mouth daily.     [provider]  ?aspirin EC 81 MG tablet Take 81 mg by mouth daily. Swallow whole.    [provider]  ?Calcium Carbonate Antacid (TUMS PO) Take 4 tablets by mouth daily.    [provider]  ?carboxymethylcellul-glycerin (OPTIVE) 0.5-0.9 % ophthalmic solution Place 1-2 drops into both eyes 3 (three) times daily as needed (dry/irritated eyes.).    [provider]  ?carvedilol (COREG) 3.125 MG tablet Take 1 tablet (3.125 mg total) by mouth 2 (two) times daily. 07/08/21   Satira Sark, MD  ?Cyanocobalamin (VITAMIN B-12) 2500 MCG SUBL Place 2,500 mcg under the tongue every morning.     [provider]  ?diphenhydrAMINE (BENADRYL) 25  MG tablet Take 25 mg by mouth every 6 (six) hours as needed for itching or allergies.    [provider]  ?DURVALUMAB IV Inject into the vein every 14 (fourteen) days. 03/19/19   [provider]  ?ferrous sulfate 325 (65 FE) MG tablet Take 325 mg by mouth every evening.    [provider]  ?furosemide (LASIX) 20 MG tablet TAKE 1 TABLET BY MOUTH AS NEEDED FOR FLUID OR EDEMA (DAILY AS NEEDED FOR SWELLING). 05/23/20   Derek Jack, MD  ?gabapentin (NEURONTIN) 300 MG capsule Take by mouth.    [provider]  ?HYDROcodone-acetaminophen (NORCO/VICODIN) 5-325 MG tablet Take 1 tablet by mouth 2 (two) times daily as needed. 01/12/20   [provider]  ?lidocaine-prilocaine (EMLA) cream APPLY SMALL AMOUNT TO PORT A CATH SITE AND COVER WITH PLASTIC WRAP 1 HR PRIOR TO CHEMOTHERAPY APPTS 05/03/20   Derek Jack, MD  ?Multiple Vitamin (MULITIVITAMIN WITH MINERALS) TABS Take 1 tablet by mouth daily.    [provider]  ?naproxen (NAPROSYN) 500 MG tablet Take 500 mg by mouth every 12 (twelve) hours.  02/03/19   [provider]  ?omeprazole (PRILOSEC) 20 MG capsule Take 20 mg by mouth daily. 09/28/18   [provider]  ?prochlorperazine (COMPAZINE) 10 MG tablet Take 1 tablet (10 mg total) by mouth every 6 (six) hours as needed (Nausea or vomiting). 01/06/19 05/29/19  Derek Jack, MD  ?   ? ?Allergies    ?  Patient has no known allergies.   ? ?Review of Systems   ?Review of Systems  ?Constitutional:  Negative for appetite change and fatigue.  ?HENT:  Negative for congestion, ear discharge and sinus pressure.   ?Eyes:  Negative for discharge.  ?Respiratory:  Positive for shortness of breath. Negative for cough.   ?Cardiovascular:  Negative for chest pain.  ?Gastrointestinal:  Negative for abdominal pain and diarrhea.  ?Genitourinary:  Negative for frequency and hematuria.  ?Musculoskeletal:  Negative for back pain.  ?Skin:  Negative for rash.   ?Neurological:  Negative for seizures and headaches.  ?Psychiatric/Behavioral:  Negative for hallucinations.   ? ?Physical Exam ?Updated Vital Signs ?BP 123/72   Pulse 81   Temp 97.7 ?F (36.5 ?C) (Oral)   Resp (!) 22   Ht 5\' 2"  (1.575 m)   Wt 117.9 kg   SpO2 92%   BMI 47.55 kg/m?  ?Physical Exam ?Vitals and nursing note reviewed.  ?Constitutional:   ?   Appearance: She is well-developed.  ?HENT:  ?   Head: Normocephalic.  ?   Mouth/Throat:  ?   Mouth: Mucous membranes are moist.  ?Eyes:  ?   General: No scleral icterus. ?   Conjunctiva/sclera: Conjunctivae normal.  ?Neck:  ?   Thyroid: No thyromegaly.  ?Cardiovascular:  ?   Rate and Rhythm: Normal rate and regular rhythm.  ?   Heart sounds: No murmur heard. ?  No friction rub. No gallop.  ?Pulmonary:  ?   Breath sounds: No stridor. No wheezing or rales.  ?Chest:  ?   Chest wall: No tenderness.  ?Abdominal:  ?   General: There is no distension.  ?   Tenderness: There is no abdominal tenderness. There is no rebound.  ?Musculoskeletal:     ?   General: Normal range of motion.  ?   Cervical back: Neck supple.  ?   Comments: 3+ edema both lower legs  ?Lymphadenopathy:  ?   Cervical: No cervical adenopathy.  ?Skin: ?   Findings: No erythema or rash.  ?Neurological:  ?   Mental Status: She is alert and oriented to person, place, and time.  ?   Motor: No abnormal muscle tone.  ?   Coordination: Coordination normal.  ?Psychiatric:     ?   Behavior: Behavior normal.  ? ? ?ED Results / Procedures / Treatments   ?Labs ?(all labs ordered are listed, but only abnormal results are displayed) ?Labs Reviewed  ?CBC WITH DIFFERENTIAL/PLATELET - Abnormal; Notable for the following components:  ?    Result Value  ? RDW 15.8 (*)   ? All other components within normal limits  ?COMPREHENSIVE METABOLIC PANEL - Abnormal; Notable for the following components:  ? Potassium 3.1 (*)   ? Glucose, Bld 119 (*)   ? ALT 45 (*)   ? All other components within normal limits  ?BRAIN NATRIURETIC  PEPTIDE - Abnormal; Notable for the following components:  ? B Natriuretic Peptide 554.0 (*)   ? All other components within normal limits  ?TROPONIN I (HIGH SENSITIVITY) - Abnormal; Notable for the following components:  ? Troponin I (High Sensitivity) 56 (*)   ? All other components within normal limits  ?TROPONIN I (HIGH SENSITIVITY)  ? ? ?EKG ?EKG Interpretation ? ?Date/Time:  Sunday July 17 2021 12:51:48 EDT ?Ventricular Rate:  96 ?PR Interval:  154 ?QRS Duration: 128 ?QT Interval:  394 ?QTC Calculation: 498 ?R Axis:   241 ?Text Interpretation: Sinus rhythm IVCD, consider atypical  RBBB Inferior infarct, old Probable anterior infarct, age indeterminate Confirmed by Milton Ferguson ([id number]) on 07/17/2021 2:56:14 PM ? ?Radiology ?DG Chest Portable 1 View ? ?Result Date: 07/17/2021 ?CLINICAL DATA:  Shortness of breath and hypoxia. EXAM: PORTABLE CHEST 1 VIEW COMPARISON:  11/19/2018 FINDINGS: 1305 hours. The cardio pericardial silhouette is enlarged. Confluent masslike lesion seen in the left mid lung previously is decreased in the interval with more patchy left lung opacity in this region today, consistent with post treatment scarring noted on multiple intervening chest CTs. Probable small volume retrocardiac atelectasis at the left base. No pulmonary edema. Right Port-A-Cath remains in place. Telemetry leads overlie the chest. IMPRESSION: Post treatment scarring left mid lung with probable retrocardiac atelectasis at the left base. Electronically Signed   By: Misty Stanley M.D.   On: 07/17/2021 13:30   ? ?Procedures ?Procedures  ? ? ?Medications Ordered in ED ?Medications  ?furosemide (LASIX) injection 40 mg (has no administration in time range)  ?iohexol (OMNIPAQUE) 300 MG/ML solution 75 mL (75 mLs Intravenous Contrast Given 07/17/21 1535)  ? ? ?ED Course/ Medical Decision Making/ A&P ? CRITICAL CARE ?Performed by: Milton Ferguson ?Total critical care time: 35 minutes ?Critical care time was exclusive of separately  billable procedures and treating other patients. ?Critical care was necessary to treat or prevent imminent or life-threatening deterioration. ?Critical care was time spent personally by me on the following [place]

## 2021-07-17 NOTE — ED Provider Notes (Signed)
Patient is a 71 year old female presenting with shortness of breath, she does have a history of lung cancer from several years ago, she went through chemo and radiation, notes that she has had some increasing swelling of her legs but now has a heaviness over her chest and a feeling like she gets severely short of breath with any exertion, has been going on for a couple of days, no fevers or chills, no nausea or vomiting. ? ?On my exam she has bilateral lower extremity significant edema, she has overall pretty clear lung sounds on the right with a subtle decrease sounds on the left lower lobe, she is mildly hypoxic on 5 L which is very unusual because on no oxygen she is severely hypoxic.  She is currently on 5 L and is 93%, her blood pressure is 123/72 and her heart rate is in the 80s. ? ?I personally viewed and interpreted her CT scan which shows that she has bilateral pulmonary embolism more on the right, there is considered to be a submassive pulmonary embolism with right heart strain with RV to LV ratio of 1.8. ? ?We will discuss with critical care, will discuss with hospitalist, heparin has been ordered, patient is critically ill ? ?.Critical Care ?Performed by: Noemi Chapel, MD ?Authorized by: Noemi Chapel, MD  ? ?Critical care provider statement:  ?  Critical care time (minutes):  30 ?  Critical care time was exclusive of:  Separately billable procedures and treating other patients and teaching time ?  Critical care was necessary to treat or prevent imminent or life-threatening deterioration of the following conditions:  Respiratory failure (submassive PE) ?  Critical care was time spent personally by me on the following activities:  Development of treatment plan with patient or surrogate, discussions with consultants, evaluation of patient's response to treatment, examination of patient, ordering and review of laboratory studies, ordering and review of radiographic studies, ordering and performing  treatments and interventions, pulse oximetry, re-evaluation of patient's condition, review of old charts and obtaining history from patient or surrogate ?  I assumed direction of critical care for this patient from another provider in my specialty: yes   ?  Care discussed with: admitting provider   ?Comments:  ?    ? ?Final diagnoses:  ?Acute pulmonary embolism with acute cor pulmonale, unspecified pulmonary embolism type ([place])  ? ? ?  ?Noemi Chapel, MD ?07/17/21 2317 ? ?

## 2021-07-17 NOTE — Progress Notes (Signed)
ANTICOAGULATION CONSULT NOTE - Initial Consult ? ?Pharmacy Consult for Heparin ?Indication: pulmonary embolus ? ?No Known Allergies ? ?Patient Measurements: ?Height: 5\' 2"  (157.5 cm) ?Weight: 117.9 kg (260 lb) ?IBW/kg (Calculated) : 50.1 ?HEPARIN DW (KG): 79.2  ? ?Vital Signs: ?Temp: 97.7 ?F (36.5 ?C) (04/30 1249) ?Temp Source: Oral (04/30 1249) ?BP: 117/66 (04/30 1615) ?Pulse Rate: 87 (04/30 1615) ? ?Labs: ?Recent Labs  ?  07/17/21 ?1320 07/17/21 ?1516  ?HGB 12.9  --   ?HCT 40.8  --   ?PLT 366  --   ?CREATININE 0.93  --   ?TROPONINIHS 56* 60*  ? ? ?Estimated Creatinine Clearance: 69.6 mL/min (by C-G formula based on SCr of 0.93 mg/dL). ? ? ?Medical History: ?Past Medical History:  ?Diagnosis Date  ? Anemia   ? Aortic stenosis   ? Arthritis   ? Cancer [facility])   ? Lung  ? Coronary artery calcification seen on CT scan   ? Essential hypertension   ? GERD (gastroesophageal reflux disease)   ? History of lung cancer   ? Stage III adenocarcinoma status post chemoradiation  ? Port-A-Cath in place 01/06/2019  ? Type 2 diabetes mellitus ([place])   ? ? ?Medications:  ?See med rec ? ?Assessment: ?with shortness of breath, she does have a history of lung cancer from several years ago, she went through chemo and radiation, notes that she has had some increasing swelling of her legs but now has a heaviness over her chest and a feeling like she gets severely short of breath with any exertion, has been going on for a couple of days, no fevers or chills, no nausea or vomiting.  CT scan shows  bilateral pulmonary embolism more on the right, there is considered to be a submassive pulmonary embolism with right heart strain with RV to LV ratio of 1.8.   Patient is not on oral anticoagulants . Pharmacy asked to start heparin ? ?Goal of Therapy:  ?Heparin level 0.3-0.7 units/ml ?Monitor platelets by anticoagulation protocol: Yes ?  ?Plan:  ?Give 4500 units bolus x 1 ?Start heparin infusion at 1300 units/hr ?Check anti-Xa level in 8 hours and  daily while on heparin ?Continue to monitor H&H and platelets ? ?Isac Sarna, BS Pharm D, BCPS ?Clinical Pharmacist ?07/17/2021,4:27 PM ? ? ?

## 2021-07-17 NOTE — ED Notes (Signed)
Assisted pt to bathroom, O2 sat 88-89% on 4 liters of oxygen. Patient is anxious and  restless. Will notify EDP. ?

## 2021-07-18 ENCOUNTER — Inpatient Hospital Stay (HOSPITAL_COMMUNITY): Payer: Medicare HMO

## 2021-07-18 DIAGNOSIS — I2609 Other pulmonary embolism with acute cor pulmonale: Secondary | ICD-10-CM | POA: Diagnosis not present

## 2021-07-18 DIAGNOSIS — I10 Essential (primary) hypertension: Secondary | ICD-10-CM | POA: Diagnosis not present

## 2021-07-18 DIAGNOSIS — I350 Nonrheumatic aortic (valve) stenosis: Secondary | ICD-10-CM | POA: Diagnosis not present

## 2021-07-18 DIAGNOSIS — E1159 Type 2 diabetes mellitus with other circulatory complications: Secondary | ICD-10-CM

## 2021-07-18 DIAGNOSIS — R0609 Other forms of dyspnea: Secondary | ICD-10-CM | POA: Diagnosis not present

## 2021-07-18 LAB — BASIC METABOLIC PANEL
Anion gap: 6 (ref 5–15)
Anion gap: 9 (ref 5–15)
BUN: 14 mg/dL (ref 8–23)
BUN: 16 mg/dL (ref 8–23)
CO2: 26 mmol/L (ref 22–32)
CO2: 19 mmol/L — ABNORMAL LOW (ref 22–32)
Calcium: 8.4 mg/dL — ABNORMAL LOW (ref 8.9–10.3)
Calcium: 8.7 mg/dL — ABNORMAL LOW (ref 8.9–10.3)
Chloride: 112 mmol/L — ABNORMAL HIGH (ref 98–111)
Chloride: 116 mmol/L — ABNORMAL HIGH (ref 98–111)
Creatinine, Ser: 1.07 mg/dL — ABNORMAL HIGH (ref 0.44–1.00)
Creatinine, Ser: 1.03 mg/dL — ABNORMAL HIGH (ref 0.44–1.00)
GFR, Estimated: 56 mL/min — ABNORMAL LOW (ref >60)
GFR, Estimated: 59 mL/min — ABNORMAL LOW (ref >60)
Glucose, Bld: 104 mg/dL — ABNORMAL HIGH (ref 70–99)
Glucose, Bld: 99 mg/dL (ref 70–99)
Potassium: 3.8 mmol/L (ref 3.5–5.1)
Potassium: 4.3 mmol/L (ref 3.5–5.1)
Sodium: 144 mmol/L (ref 135–145)
Sodium: 144 mmol/L (ref 135–145)

## 2021-07-18 LAB — ECHOCARDIOGRAM COMPLETE
AR max vel: 1.01 cm2
AV Area VTI: 1.25 cm2
AV Area mean vel: 0.98 cm2
AV Mean grad: 28.2 mmHg
AV Peak grad: 46.9 mmHg
Ao pk vel: 3.42 m/s
Area-P 1/2: 2.81 cm2
Height: 62 in
P 1/2 time: 715 msec
S' Lateral: 2.1 cm
Weight: 4151.7 oz

## 2021-07-18 LAB — CBC
HCT: 37.8 % (ref 36.0–46.0)
Hemoglobin: 12.1 g/dL (ref 12.0–15.0)
MCH: 28.1 pg (ref 26.0–34.0)
MCHC: 32 g/dL (ref 30.0–36.0)
MCV: 87.9 fL (ref 80.0–100.0)
Platelets: 340 10*3/uL (ref 150–400)
RBC: 4.3 MIL/uL (ref 3.87–5.11)
RDW: 16 % — ABNORMAL HIGH (ref 11.5–15.5)
WBC: 6.8 10*3/uL (ref 4.0–10.5)
nRBC: 0 % (ref 0.0–0.2)

## 2021-07-18 LAB — HEMOGLOBIN A1C
Hgb A1c MFr Bld: 5.9 % — ABNORMAL HIGH (ref 4.8–5.6)
Mean Plasma Glucose: 122.63 mg/dL

## 2021-07-18 LAB — HEPARIN LEVEL (UNFRACTIONATED)
Heparin Unfractionated: <0.1 IU/mL — ABNORMAL LOW (ref 0.30–0.70)
Heparin Unfractionated: 1.06 IU/mL — ABNORMAL HIGH (ref 0.30–0.70)
Heparin Unfractionated: 0.49 IU/mL (ref 0.30–0.70)

## 2021-07-18 LAB — GLUCOSE, CAPILLARY
Glucose-Capillary: 97 mg/dL (ref 70–99)
Glucose-Capillary: 83 mg/dL (ref 70–99)
Glucose-Capillary: 112 mg/dL — ABNORMAL HIGH (ref 70–99)
Glucose-Capillary: 88 mg/dL (ref 70–99)

## 2021-07-18 LAB — MAGNESIUM: Magnesium: 1.9 mg/dL (ref 1.7–2.4)

## 2021-07-18 LAB — HIV ANTIBODY (ROUTINE TESTING W REFLEX): HIV Screen 4th Generation wRfx: NONREACTIVE

## 2021-07-18 IMAGING — US US EXTREM LOW VENOUS
1 series · 13 of 24 positions shown · non-contrast
Comparison: None.

CLINICAL DATA: 70-year-old female with lower extremity edema,
pulmonary embolism.



[Series 1: us venous img lower bilat (dvt) · portal-venous · 13 of 83 slices shown]
[im 1/83]
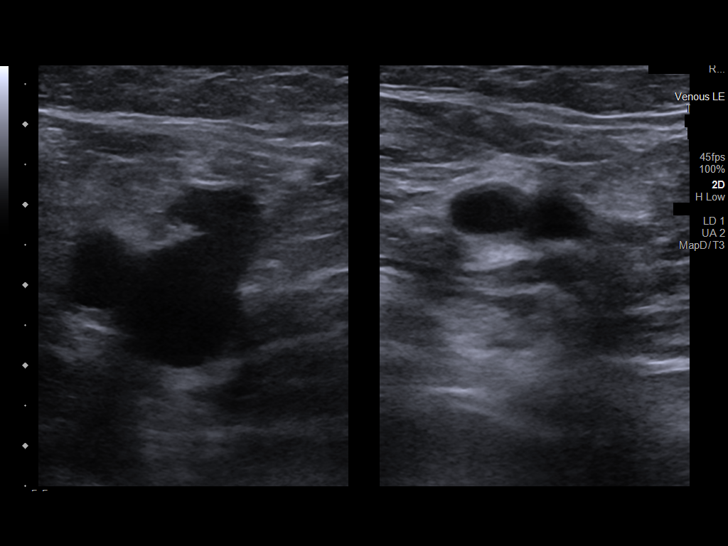
[im 8/83]
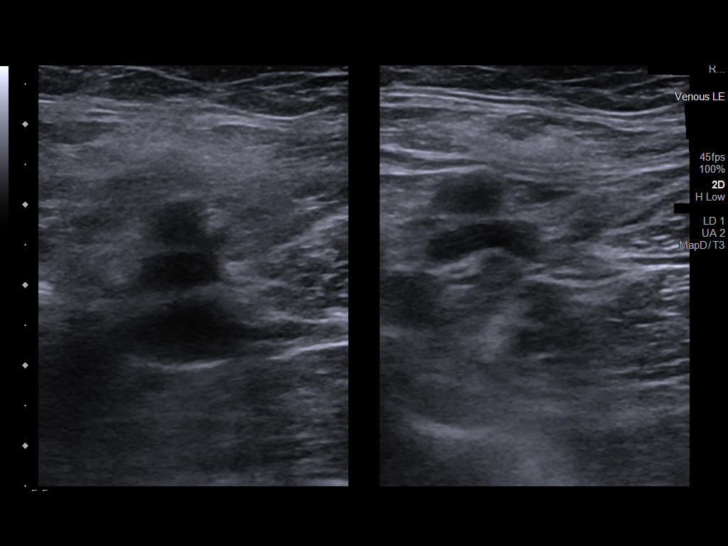
[im 15/83]
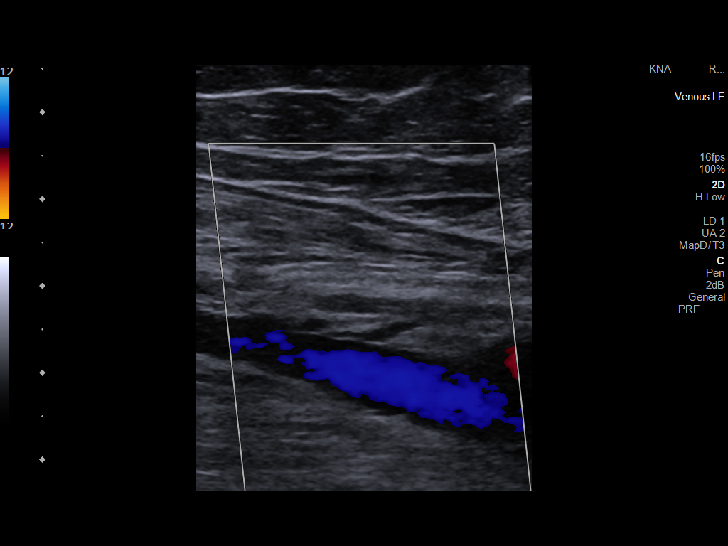
[im 22/83]
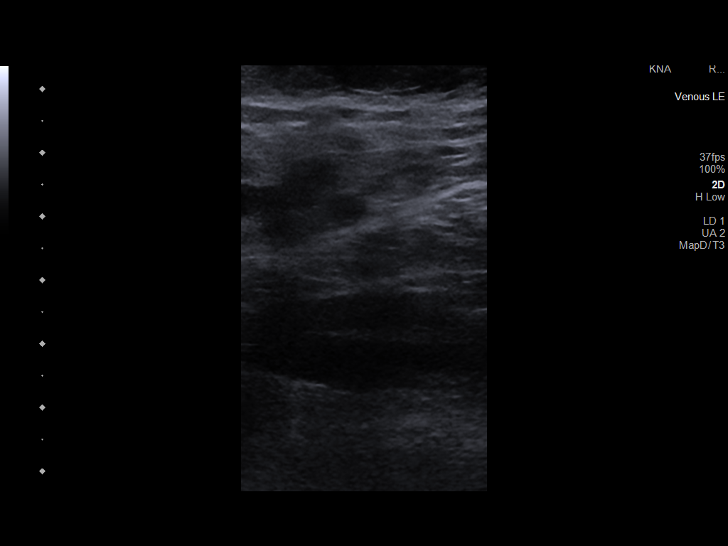
[im 29/83]
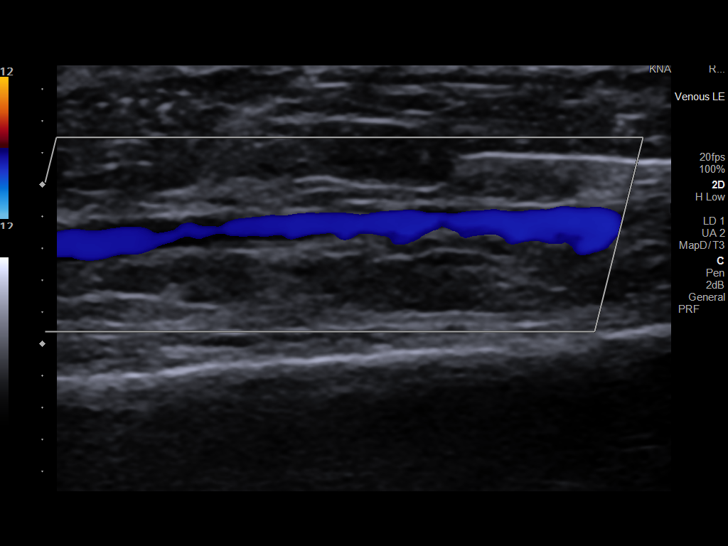
[im 36/83]
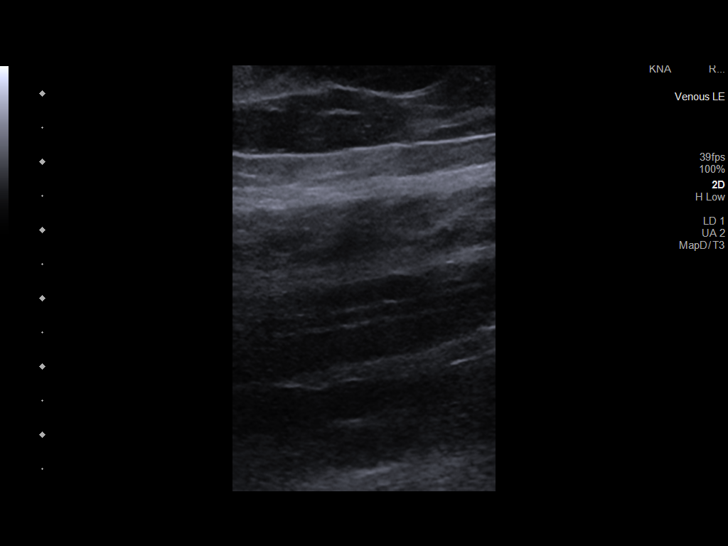
[im 43/83]
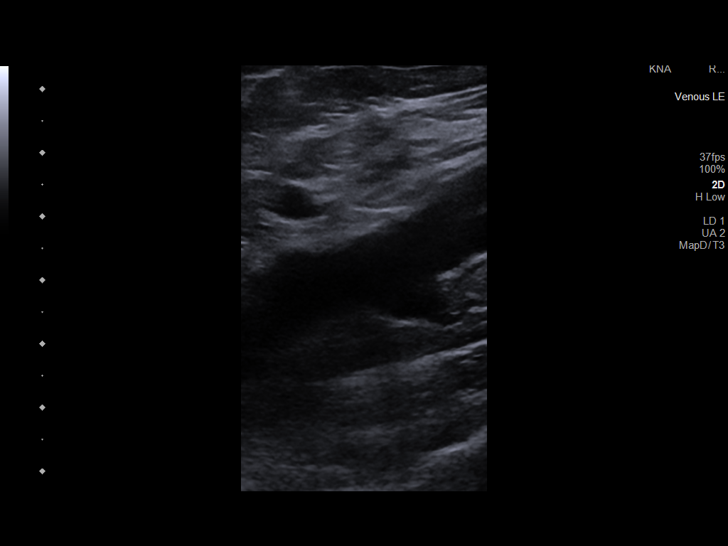
[im 47/83]
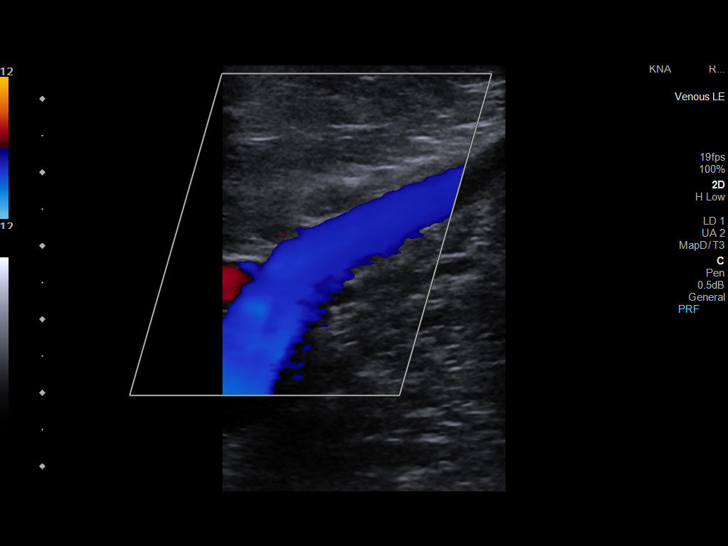
[im 54/83]
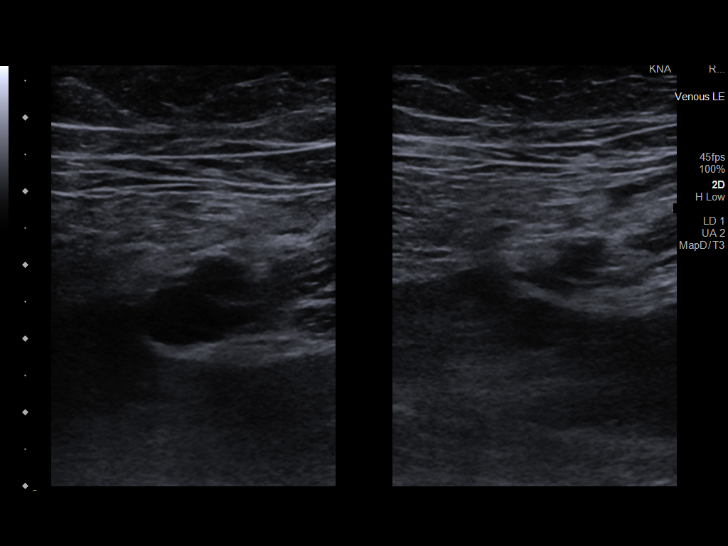
[im 61/83]
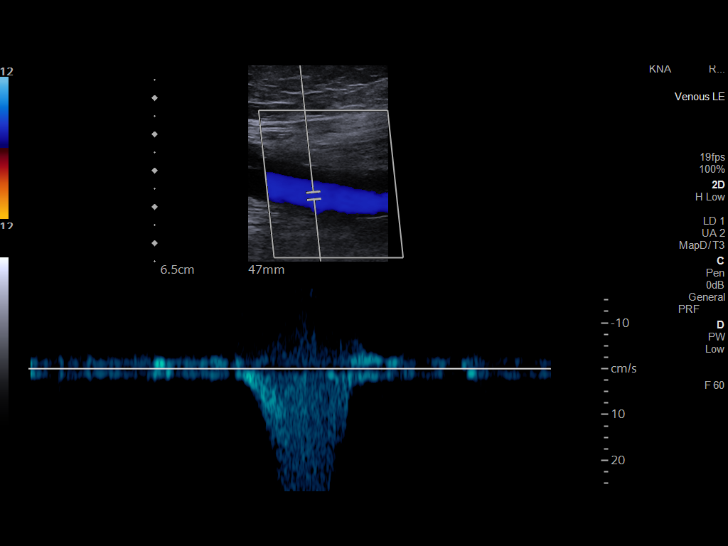
[im 68/83]
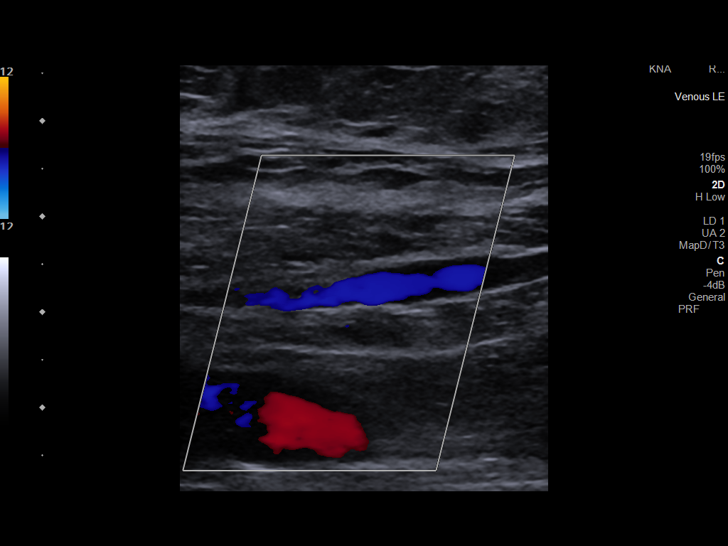
[im 75/83]
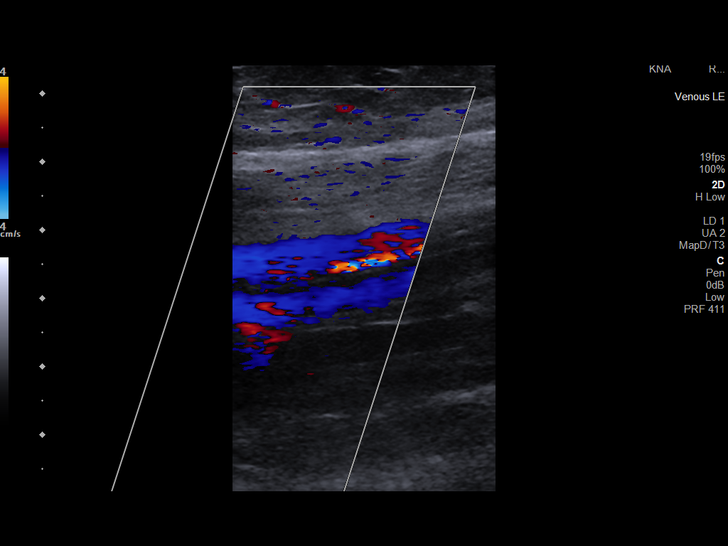
[im 83/83]
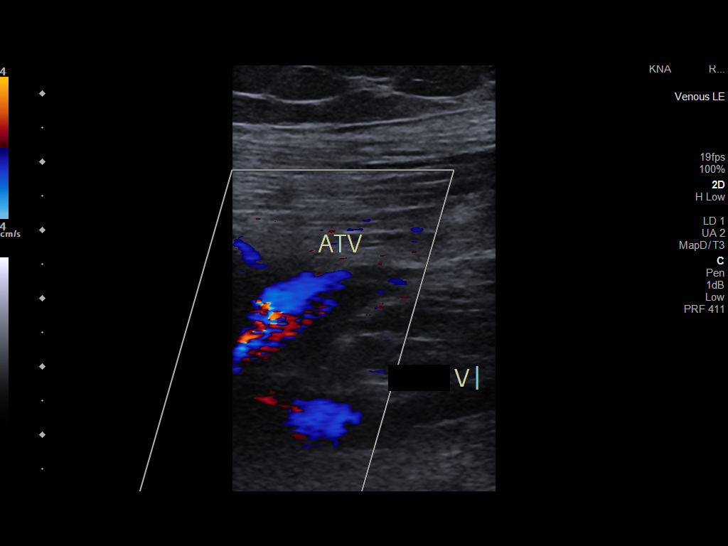

[13 of 24 positions shown; findings below may reference images not displayed]

FINDINGS: RIGHT LOWER EXTREMITY

Common Femoral Vein: No evidence of thrombus. Normal
compressibility, respiratory phasicity and response to augmentation.

Saphenofemoral Junction: No evidence of thrombus. Normal
compressibility and flow on color Doppler imaging.

Profunda Femoral Vein: No evidence of thrombus. Normal
compressibility and flow on color Doppler imaging.

Femoral Vein: No evidence of thrombus. Normal compressibility,
respiratory phasicity and response to augmentation.

Popliteal Vein: No evidence of thrombus. Normal compressibility,
respiratory phasicity and response to augmentation.

Calf Veins: No evidence of thrombus. Normal compressibility and flow
on color Doppler imaging.

Other Findings:  None.

LEFT LOWER EXTREMITY

Common Femoral Vein: No evidence of thrombus. Normal
compressibility, respiratory phasicity and response to augmentation.

Saphenofemoral Junction: No evidence of thrombus. Normal
compressibility and flow on color Doppler imaging.

Profunda Femoral Vein: No evidence of thrombus. Normal
compressibility and flow on color Doppler imaging.

Femoral Vein: No evidence of thrombus. Normal compressibility,
respiratory phasicity and response to augmentation.

Popliteal Vein: No evidence of thrombus. Normal compressibility,
respiratory phasicity and response to augmentation.

Calf Veins: No evidence of thrombus. Normal compressibility and flow
on color Doppler imaging.

Other Findings:  None.
IMPRESSION: No evidence of bilateral lower extremity deep venous thrombosis.

## 2021-07-18 MED ORDER — HEPARIN (PORCINE) 25000 UT/250ML-% IV SOLN
1500.0000 [IU]/h | INTRAVENOUS | Status: AC
  Administered 2021-07-19 – 2021-07-22 (×5): 1500 [IU]/h via INTRAVENOUS
  Filled 2021-07-18 (×5): qty 250

## 2021-07-18 MED ORDER — HEPARIN BOLUS VIA INFUSION
4000.0000 [IU] | Freq: Once | INTRAVENOUS | Status: AC
  Administered 2021-07-18: 4000 [IU] via INTRAVENOUS
  Filled 2021-07-18: qty 4000

## 2021-07-18 MED ORDER — CHLORHEXIDINE GLUCONATE CLOTH 2 % EX PADS
6.0000 | MEDICATED_PAD | Freq: Every day | CUTANEOUS | Status: DC
  Administered 2021-07-18 – 2021-07-20 (×3): 6 via TOPICAL

## 2021-07-18 NOTE — Progress Notes (Signed)
Patient's port accessed d/t patient's AC IV occluding with minimal movement. ?

## 2021-07-18 NOTE — Progress Notes (Signed)
ANTICOAGULATION CONSULT NOTE - ? ?Pharmacy Consult for Heparin ?Indication: pulmonary embolus ? ?No Known Allergies ? ?Patient Measurements: ?Height: 5\' 2"  (157.5 cm) ?Weight: 117.7 kg (259 lb 7.7 oz) ?IBW/kg (Calculated) : 50.1 ?HEPARIN DW (KG): 79.2  ? ?Vital Signs: ?Temp: 97.5 ?F (36.4 ?C) (05/01 1633) ?Temp Source: Oral (05/01 1633) ?BP: 105/52 (05/01 1600) ?Pulse Rate: 83 (05/01 1633) ? ?Labs: ?Recent Labs  ?  07/17/21 ?1320 07/17/21 ?1516 07/18/21 ?0107 07/18/21 ?2122 07/18/21 ?0815 07/18/21 ?1357 07/18/21 ?1908  ?HGB 12.9  --   --  12.1  --   --   --   ?HCT 40.8  --   --  37.8  --   --   --   ?PLT 366  --   --  340  --   --   --   ?HEPARINUNFRC  --   --  <0.10*  --  1.06*  --  0.49  ?CREATININE 0.93  --   --  1.07*  --  1.03*  --   ?TROPONINIHS 56* 60*  --   --   --   --   --   ? ? ? ?Estimated Creatinine Clearance: 62.7 mL/min (A) (by C-G formula based on SCr of 1.03 mg/dL (H)). ? ? ?Medical History: ?Past Medical History:  ?Diagnosis Date  ? Anemia   ? Aortic stenosis   ? Arthritis   ? Cancer [facility])   ? Lung  ? Coronary artery calcification seen on CT scan   ? Essential hypertension   ? GERD (gastroesophageal reflux disease)   ? History of lung cancer   ? Stage III adenocarcinoma status post chemoradiation  ? Port-A-Cath in place 01/06/2019  ? Type 2 diabetes mellitus ([place])   ? ? ?Medications:  ?See med rec ? ?Assessment: ?with shortness of breath, she does have a history of lung cancer from several years ago, she went through chemo and radiation, notes that she has had some increasing swelling of her legs but now has a heaviness over her chest and a feeling like she gets severely short of breath with any exertion, has been going on for a couple of days, no fevers or chills, no nausea or vomiting.  CT scan shows  bilateral pulmonary embolism more on the right, there is considered to be a submassive pulmonary embolism with right heart strain with RV to LV ratio of 1.8.   Patient is not on oral anticoagulants  . Pharmacy asked to start heparin ? ?HL is 0.49, therapeutic. ? ?Goal of Therapy:  ?Heparin level 0.3-0.7 units/ml ?Monitor platelets by anticoagulation protocol: Yes ?  ?Plan:  ?Continue heparin infusion at 1500 units/hr ?Check anti-Xa level in 6 hours and daily while on heparin ?Continue to monitor H&H and platelets ? ?Assunta Curtis, RPh ?Clinical Pharmacist ?07/18/2021,8:36 PM ? ? ?

## 2021-07-18 NOTE — Progress Notes (Addendum)
ANTICOAGULATION CONSULT NOTE - ? ?Pharmacy Consult for Heparin ?Indication: pulmonary embolus ? ?No Known Allergies ? ?Patient Measurements: ?Height: 5\' 2"  (157.5 cm) ?Weight: 117.7 kg (259 lb 7.7 oz) ?IBW/kg (Calculated) : 50.1 ?HEPARIN DW (KG): 79.2  ? ?Vital Signs: ?Temp: 97.7 ?F (36.5 ?C) (05/01 9774) ?Temp Source: Oral (05/01 0716) ?BP: 103/69 (05/01 0600) ?Pulse Rate: 74 (05/01 0716) ? ?Labs: ?Recent Labs  ?  07/17/21 ?1320 07/17/21 ?1516 07/18/21 ?0107 07/18/21 ?1423 07/18/21 ?0815  ?HGB 12.9  --   --  12.1  --   ?HCT 40.8  --   --  37.8  --   ?PLT 366  --   --  340  --   ?HEPARINUNFRC  --   --  <0.10*  --  1.06*  ?CREATININE 0.93  --   --  1.07*  --   ?TROPONINIHS 56* 60*  --   --   --   ? ? ? ?Estimated Creatinine Clearance: 60.4 mL/min (A) (by C-G formula based on SCr of 1.07 mg/dL (H)). ? ? ?Medical History: ?Past Medical History:  ?Diagnosis Date  ? Anemia   ? Aortic stenosis   ? Arthritis   ? Cancer [facility])   ? Lung  ? Coronary artery calcification seen on CT scan   ? Essential hypertension   ? GERD (gastroesophageal reflux disease)   ? History of lung cancer   ? Stage III adenocarcinoma status post chemoradiation  ? Port-A-Cath in place 01/06/2019  ? Type 2 diabetes mellitus ([place])   ? ? ?Medications:  ?See med rec ? ?Assessment: ?with shortness of breath, she does have a history of lung cancer from several years ago, she went through chemo and radiation, notes that she has had some increasing swelling of her legs but now has a heaviness over her chest and a feeling like she gets severely short of breath with any exertion, has been going on for a couple of days, no fevers or chills, no nausea or vomiting.  CT scan shows  bilateral pulmonary embolism more on the right, there is considered to be a submassive pulmonary embolism with right heart strain with RV to LV ratio of 1.8.   Patient is not on oral anticoagulants . Pharmacy asked to start heparin ? ?HL is 1.06, supratherapeutic. No issues with  infusion ? ?Goal of Therapy:  ?Heparin level 0.3-0.7 units/ml ?Monitor platelets by anticoagulation protocol: Yes ?  ?Plan:  ?Hold heparin x 1 hours then restart  heparin infusion at 1500 units/hr ?Check anti-Xa level in 8 hours and daily while on heparin ?Continue to monitor H&H and platelets ? ?Isac Sarna, BS Pharm D, BCPS ?Clinical Pharmacist ?07/18/2021,9:54 AM ? ? ?

## 2021-07-18 NOTE — Progress Notes (Signed)
?  Transition of Care (TOC) Screening Note ? ? ?Patient Details  ?Name: Denise [name] ?Date of Birth: Feb 19, 1951 ? ? ?Transition of Care (TOC) CM/SW Contact:    ?Dalyah Pla D, LCSW ?Phone Number: ?07/18/2021, 3:37 PM ? ? ? ?Transition of Care Department [facility]) has reviewed patient and no TOC needs have been identified at this time. We will continue to monitor patient advancement through interdisciplinary progression rounds. If new patient transition needs arise, please place a TOC consult. ?  ?

## 2021-07-18 NOTE — Progress Notes (Signed)
?Progress Note ? ? ?Patient: Denise [name] SJG:[id number] DOB: [birth year] DOA: 07/17/2021     1 ?DOS: the patient was seen and examined on 07/18/2021 ?  ?Brief hospital course: ?As per H&P written by Dr. Cathlean Sauer on 07/17/2021. ?Mendi Constable is a 71 y.o. female with medical history significant of lung cancer sp chemotherapy and radiation who presented with dyspnea. Reported 2 days of worsening dyspnea on exertion, to the point where she is symptomatic with minimal efforts, no chest pain, cough or hemoptysis.  ?Patient walks with the help of a cane because right knee pain, able to do activities of daily living when at baseline. 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  ?  ?Positive chronic lower extremity edema that seems to be worse at the end of the day and improved with leg elevation.  ?  ?She denies any thrombosis in the past. No recent change in her medications.  ?Last oncology follow up on 04/2021 with no signs of metastatic disease in the chest.  ? ?Assessment and Plan: ?* Pulmonary embolus ([place]) ?-Acute pulmonary embolism with possible pulmonary infarcts.  ?CT evidence of right heart strain. ?-Follow-up 2D echo results ?-Continue IV heparin drip. ?-Lower extremity Dopplers negative for DVT ?-Planning for 48 hours of IV anticoagulation and subsequent transition to Eliquis. ? ?Essential hypertension ?-Soft but, stable ?-Continue holding diuretics: ?-Follow heart healthy diet ?-Continue the use of carvedilol. ? ?Type 2 diabetes mellitus ([place]) ?-With prior history of diabetic neuropathy; continue the use of gabapentin. ?-Continue sliding scale insulin and modify carbohydrate diet. ?-Follow CBGs and adjust hypoglycemic regimen as needed. ? ? ? ?Aortic stenosis ?-With component of chronic and compensated diastolic heart failure.   ?-Follow repeat echocardiogram. ?-Continue to follow daily weights, maintain adequate hydration and  nutrition. ?-Continue the use of carvedilol ?-Resume the use of diuretics once blood pressure stabilized. ?-Continue heart healthy/low-sodium diet ? ?Hypokalemia ?-Renal function stable ?-Electrolyte has been repleted and currently within normal limits. ?-Continue to follow trend. ? ?History of lung cancer ?-Stage 3 b left adenocarcinoma.  ?-Sp chemotherapy and radiation. ?-Last follow up with no signs of metastasis. ?-CT chest at time of admission demonstrating bilateral pulmonary embolism and suggesting concerns for metastatic lesion to the liver. ?-Outpatient MRI and follow-up with oncology service recommended. ? ?Class 3 obesity ([place]) ?-Body mass index is 47.46 kg/m?. ?-Low calorie diet, portion control and increase physical activity discussed with patient. ? ? ?Subjective:  ?Afebrile, no nausea, no vomiting, using 2 L nasal cannula supplementation; expressing short winded sensation with activity and is still ongoing chest pressure with inspiration (improved). ? ?Physical Exam: ?Vitals:  ? 07/18/21 1009 07/18/21 1017 07/18/21 1100 07/18/21 1108  ?BP: 118/71  110/65   ?Pulse:  74 75 70  ?Resp: (!) 22 16 14 18   ?Temp:    98.1 ?F (36.7 ?C)  ?TempSrc:    Oral  ?SpO2:  91% 100% 100%  ?Weight:      ?Height:      ? ?General exam: Alert, awake, oriented x 3; reporting some chest pressure, mildly tachypneic and short winded with activity.  2 L nasal cannula supplementation in place.  Afebrile ?Respiratory system: Clear to auscultation. Respiratory effort normal.  No using accessory muscles at rest. ?Cardiovascular system:RRR. No murmurs, rubs, gallops.  Unable to assess JVD with body habitus. ?Gastrointestinal system: Abdomen is obese, nondistended, soft and nontender. No organomegaly or masses felt. Normal bowel sounds heard. ?Central  nervous system: Alert and oriented. No focal neurological deficits. ?Extremities: No cyanosis or clubbing; 2+ edema appreciated bilaterally. ?Skin: No petechiae. ?Psychiatry: Judgement and  insight appear normal. Mood & affect appropriate.  ? ?Data Reviewed: ?Lower extremity Dopplers negative for DVT ?Basic metabolic panel demonstrating a sodium of 144, potassium 3.8, BUN 14 and creatinine 1.07 ?CBC demonstrating WBCs of 6.8, hemoglobin 12.1 platelet count 340 K ?Nonreactive HIV screen fourth-generation ?Magnesium 1.9 ?CBGs in the 80s to 120. ? ?Family Communication: Daughter at bedside. ? ?Disposition: ?Status is: Inpatient ?Remains inpatient appropriate because: Ongoing heparin drip and treatment for submassive pulmonary embolism. ? ? Planned Discharge Destination: Home ? ? ?Author: ?Barton Dubois, MD ?07/18/2021 1:39 PM ? ?For on call review [email or web address].  ?

## 2021-07-18 NOTE — Progress Notes (Signed)
ANTICOAGULATION CONSULT NOTE - Follow Up Consult ? ?Pharmacy Consult for heparin ?Indication: pulmonary embolus ? ?Labs: ?Recent Labs  ?  07/17/21 ?1320 07/17/21 ?1516 07/18/21 ?0107  ?HGB 12.9  --   --   ?HCT 40.8  --   --   ?PLT 366  --   --   ?HEPARINUNFRC  --   --  <0.10*  ?CREATININE 0.93  --   --   ?TROPONINIHS 56* 60*  --   ? ? ?Assessment: ?71yo female subtherapeutic on heparin with initial dosing for PE; no infusion issues or signs of bleeding per RN. ? ?Goal of Therapy:  ?Heparin level 0.3-0.7 units/ml ?  ?Plan:  ?Will rebolus with heparin 4000 units and increase heparin infusion by 4 units/kg/hr to 1700 units/hr and check level in 6 hours.   ? ?Wynona Neat, PharmD, BCPS  ?07/18/2021,2:12 AM ? ? ?

## 2021-07-19 DIAGNOSIS — I10 Essential (primary) hypertension: Secondary | ICD-10-CM | POA: Diagnosis not present

## 2021-07-19 DIAGNOSIS — I350 Nonrheumatic aortic (valve) stenosis: Secondary | ICD-10-CM | POA: Diagnosis not present

## 2021-07-19 DIAGNOSIS — I2609 Other pulmonary embolism with acute cor pulmonale: Secondary | ICD-10-CM | POA: Diagnosis not present

## 2021-07-19 LAB — GLUCOSE, CAPILLARY
Glucose-Capillary: 108 mg/dL — ABNORMAL HIGH (ref 70–99)
Glucose-Capillary: 138 mg/dL — ABNORMAL HIGH (ref 70–99)
Glucose-Capillary: 88 mg/dL (ref 70–99)
Glucose-Capillary: 148 mg/dL — ABNORMAL HIGH (ref 70–99)

## 2021-07-19 LAB — CBC
HCT: 38.8 % (ref 36.0–46.0)
Hemoglobin: 11.9 g/dL — ABNORMAL LOW (ref 12.0–15.0)
MCH: 27.5 pg (ref 26.0–34.0)
MCHC: 30.7 g/dL (ref 30.0–36.0)
MCV: 89.8 fL (ref 80.0–100.0)
Platelets: 316 10*3/uL (ref 150–400)
RBC: 4.32 MIL/uL (ref 3.87–5.11)
RDW: 16.2 % — ABNORMAL HIGH (ref 11.5–15.5)
WBC: 6 10*3/uL (ref 4.0–10.5)
nRBC: 0 % (ref 0.0–0.2)

## 2021-07-19 LAB — HEPARIN LEVEL (UNFRACTIONATED)
Heparin Unfractionated: 0.46 IU/mL (ref 0.30–0.70)
Heparin Unfractionated: 0.52 IU/mL (ref 0.30–0.70)

## 2021-07-19 NOTE — Progress Notes (Signed)
Patients blood pressure 98/70, pulse 89. Noted patient had Coreg 3.125 mg ordered. MD Dyann Kief made aware. New order given to hold Coreg this morning due to BP.  ? ?

## 2021-07-19 NOTE — Progress Notes (Addendum)
?Progress Note ? ? ?Patient: Denise [name] OXB:[id number] DOB: 02-May-1950 DOA: 07/17/2021     2 ?DOS: the patient was seen and examined on 07/19/2021 ?  ?Brief hospital course: ?As per H&P written by Dr. Cathlean Sauer on 07/17/2021. ?Denise [name] is a 71 y.o. female with medical history significant of lung cancer sp chemotherapy and radiation who presented with dyspnea. Reported 2 days of worsening dyspnea on exertion, to the point where she is symptomatic with minimal efforts, no chest pain, cough or hemoptysis.  ?Patient walks with the help of a cane because right knee pain, able to do activities of daily living when at baseline. 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  ?  ?Positive chronic lower extremity edema that seems to be worse at the end of the day and improved with leg elevation.  ?  ?She denies any thrombosis in the past. No recent change in her medications.  ?Last oncology follow up on 04/2021 with no signs of metastatic disease in the chest.  ? ?Assessment and Plan: ?* Pulmonary embolus ([place]) ?-Acute pulmonary embolism with possible pulmonary infarcts; patient with acute respiratory hypoxia and the presence of right heart failure..  ?-CT with evidence of right heart strain and 2D echo confirming right ventricular failure with pulmonary hypertension. ?-Case discussed with PCCM; at this point recommended continue IV heparin drip for a total of 5 days prior to transition to oral anticoagulation therapy.  Patient will need outpatient repeat echo in 30-month. ?-Lower extremity Dopplers negative for DVT ?-Planning to use Eliquis at discharge; given prior history of lung cancer with very likely hypercoagulable state plan is for indefinite management. ? ?Essential hypertension ?-Soft but, stable ?-Continue holding diuretics and also the use of carvedilol at this point.: ?-Heart healthy diet discussed with patient. ?-Follow-up vital signs. ? ?Type 2  diabetes mellitus ([place]) ?-With prior history of diabetic neuropathy; continue the use of gabapentin. ?-Continue sliding scale insulin and modify carbohydrate diet. ?-Follow CBGs and adjust hypoglycemic regimen as needed. ? ? ? ?Aortic stenosis ?-With component of chronic and compensated diastolic heart failure.   ?-Repeat echocardiogram demonstrating preserved ejection fraction; right-sided ventricular failure, pulmonary hypertension and dilated right atrium. ?-Continue to follow daily weights, maintain adequate hydration and nutrition. ?-Holding beta-blockers and diuretics in the setting of soft blood pressure currently ?-Resume them once blood pressure stabilized. ?-Continue heart healthy/low-sodium diet ? ?Hypokalemia ?-Renal function stable ?-Electrolyte has been repleted and currently within normal limits. ?-Continue to follow trend intermittently. ? ?History of lung cancer ?-Stage 3 b left adenocarcinoma.  ?-Sp chemotherapy and radiation. ?-Last follow up with no signs of metastasis. ?-CT chest at time of admission demonstrating bilateral pulmonary embolism and suggesting concerns for metastatic lesion to the liver. ?-Outpatient MRI and follow-up with oncology service recommended. ? ?Class 3 obesity ([place]) ?-Body mass index is 47.46 kg/m?. ?-Low calorie diet, portion control and increase physical activity discussed with patient. ? ? ?Subjective:  ?Continues present intermittent episodes of chest pressure with inspiration; short winded sensation with activity and desaturation without oxygen supplementation.  Soft but stable blood pressure appreciated. ? ?Physical Exam: ?Vitals:  ? 07/19/21 0243 07/19/21 0459 07/19/21 0917 07/19/21 1226  ?BP: (!) 104/54 (!) 110/58 98/70 126/67  ?Pulse: 76 76 89 79  ?Resp: 20 19 20  (!) 22  ?Temp: 97.7 ?F (36.5 ?C) (!) 97.5 ?F (36.4 ?C) 98.4 ?F (36.9 ?C) 97.8 ?F (36.6 ?C)  ?TempSrc:  Oral    ?  SpO2: 94% 92% 97% 95%  ?Weight:      ?Height:      ? ?General exam: Alert, awake, oriented  x 3; very short winded with minimal activity and desaturating into the mid 80s on room air.  Patient reports no frank pain but expressed pleuritic chest pressure.  No fever, no nausea, no vomiting. ?Respiratory system: Decreased breath sounds at the bases; no wheezing, no crackles, no using accessory muscles at rest; positive tachypnea appreciated with activity.  2-3 L nasal cannula in place. ?Cardiovascular system:RRR. No rubs or gallops; unable to assess JVD with body habitus. ?Gastrointestinal system: Abdomen is obese, nondistended, soft and nontender. No organomegaly or masses felt. Normal bowel sounds heard. ?Central nervous system: Alert and oriented. No focal neurological deficits. ?Extremities: No cyanosis or clubbing; 2+ edema appreciated bilaterally. ?Skin: No rashes, no petechiae. ?Psychiatry: Judgement and insight appear normal. Mood & affect appropriate.  ? ?Data Reviewed: ?Lower extremity Dopplers negative for DVT ?CBC demonstrating hemoglobin of 11.9, WBC 6.0, platelet count 316 K ?CBGs in the 88-112 range. ?Heparin level 0.46>>>> 0.52 (ACT range, followed and adjusted by pharmacy service). ? ?Family Communication: No family at bedside during today's evaluation.. ? ?Disposition: ?Status is: Inpatient ?Remains inpatient appropriate because: Ongoing heparin drip and treatment for submassive pulmonary embolism. ? ? Planned Discharge Destination: Home with home health services. ? ? ?Author: ?Barton Dubois, MD ?07/19/2021 5:41 PM ? ?For on call review [email or web address].  ?

## 2021-07-19 NOTE — Progress Notes (Signed)
ANTICOAGULATION CONSULT NOTE - ? ?Pharmacy Consult for Heparin ?Indication: pulmonary embolus ? ?No Known Allergies ? ?Patient Measurements: ?Height: 5\' 2"  (157.5 cm) ?Weight: 117.7 kg (259 lb 7.7 oz) ?IBW/kg (Calculated) : 50.1 ?HEPARIN DW (KG): 79.2  ? ?Vital Signs: ?Temp: 97.5 ?F (36.4 ?C) (05/02 0459) ?Temp Source: Oral (05/02 0459) ?BP: 110/58 (05/02 0459) ?Pulse Rate: 76 (05/02 0459) ? ?Labs: ?Recent Labs  ?  07/17/21 ?1320 07/17/21 ?1516 07/18/21 ?0107 07/18/21 ?1030 07/18/21 ?0815 07/18/21 ?1357 07/18/21 ?1908 07/19/21 ?0117 07/19/21 ?1314  ?HGB 12.9  --   --  12.1  --   --   --   --  11.9*  ?HCT 40.8  --   --  37.8  --   --   --   --  38.8  ?PLT 366  --   --  340  --   --   --   --  316  ?HEPARINUNFRC  --   --    < >  --    < >  --  0.49 0.46 0.52  ?CREATININE 0.93  --   --  1.07*  --  1.03*  --   --   --   ?TROPONINIHS 56* 60*  --   --   --   --   --   --   --   ? < > = values in this interval not displayed.  ? ? ? ?Estimated Creatinine Clearance: 62.7 mL/min (A) (by C-G formula based on SCr of 1.03 mg/dL (H)). ? ? ?Medical History: ?Past Medical History:  ?Diagnosis Date  ? Anemia   ? Aortic stenosis   ? Arthritis   ? Cancer [facility])   ? Lung  ? Coronary artery calcification seen on CT scan   ? Essential hypertension   ? GERD (gastroesophageal reflux disease)   ? History of lung cancer   ? Stage III adenocarcinoma status post chemoradiation  ? Port-A-Cath in place 01/06/2019  ? Type 2 diabetes mellitus ([place])   ? ? ?Medications:  ?See med rec ? ?Assessment: ?with shortness of breath, she does have a history of lung cancer from several years ago, she went through chemo and radiation, notes that she has had some increasing swelling of her legs but now has a heaviness over her chest and a feeling like she gets severely short of breath with any exertion, has been going on for a couple of days, no fevers or chills, no nausea or vomiting.  CT scan shows  bilateral pulmonary embolism more on the right, there is  considered to be a submassive pulmonary embolism with right heart strain with RV to LV ratio of 1.8.   Patient is not on oral anticoagulants . Pharmacy asked to start heparin ? ?HL is 0.52, therapeutic. ? ?Goal of Therapy:  ?Heparin level 0.3-0.7 units/ml ?Monitor platelets by anticoagulation protocol: Yes ?  ?Plan:  ?Continue heparin infusion at 1500 units/hr ?Check anti-Xa level daily while on heparin ?Continue to monitor H&H and platelets ? ?Donna Christen Fawnda Vitullo, PharmD, MBA ?Clinical Pharmacist ? ?07/19/2021,8:18 AM ? ? ?

## 2021-07-19 NOTE — Plan of Care (Signed)
?  Problem: Acute Rehab PT Goals(only PT should resolve) ?Goal: Pt Will Go Supine/Side To Sit ?Outcome: Progressing ?Flowsheets (Taken 07/19/2021 1638) ?Pt will go Supine/Side to Sit: ? Independently ? with modified independence ?Goal: Patient Will Transfer Sit To/From Stand ?Outcome: Progressing ?Flowsheets (Taken 07/19/2021 1638) ?Patient will transfer sit to/from stand: with modified independence ?Goal: Pt Will Transfer Bed To Chair/Chair To Bed ?Outcome: Progressing ?Flowsheets (Taken 07/19/2021 1638) ?Pt will Transfer Bed to Chair/Chair to Bed: with modified independence ?Goal: Pt Will Ambulate ?Outcome: Progressing ?Flowsheets (Taken 07/19/2021 1638) ?Pt will Ambulate: ? with modified independence ? with cane ? 100 feet ?  ?4:38 PM, 07/19/21 ?Lonell Grandchild, MPT ?Physical Therapist with [place] ?[facility] ?[phone] office ?[phone] mobile phone ? ?

## 2021-07-19 NOTE — Evaluation (Signed)
Physical Therapy Evaluation ?Patient Details ?Name: Denise [name] ?MRN: [id number] ?DOB: 1950-04-04 ?Today's Date: 07/19/2021 ? ?History of Present Illness ? Denise [name] is a 71 y.o. female with medical history significant of lung cancer sp chemotherapy and radiation who presented with dyspnea. Reported 2 days of worsening dyspnea on exertion, to the point where she is symptomatic with minimal efforts, no chest pain, cough or hemoptysis.   Patient walks with the help of a cane because right knee pain, able to do activities of daily living when at baseline. 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 ?  ?Clinical Impression ? Patient functioning near baseline for functional mobility and gait other than having to frequently lean on nearby objects for support while using SPC mostly due to c/o SOB requiring frequent standing rest breaks.  Patient ambulated on room air with SpO2 at 90-95%, no loss of balance and tolerated sitting up at bedside after therapy.  Blood noted coming from IV insertion - RN notified.  Patient will benefit from continued skilled physical therapy in hospital and recommended venue below to increase strength, balance, endurance for safe ADLs and gait.  ?   ?   ? ?Recommendations for follow up therapy are one component of a multi-disciplinary discharge planning process, led by the attending physician.  Recommendations may be updated based on patient status, additional functional criteria and insurance authorization. ? ?Follow Up Recommendations Home health PT ? ?  ?Assistance Recommended at Discharge PRN  ?Patient can return home with the following ? A little help with walking and/or transfers;Assistance with cooking/housework;Help with stairs or ramp for entrance ? ?  ?Equipment Recommendations None recommended by PT  ?Recommendations for Other Services ?    ?  ?Functional Status Assessment Patient has had a recent decline in their  functional status and demonstrates the ability to make significant improvements in function in a reasonable and predictable amount of time.  ? ?  ?Precautions / Restrictions Precautions ?Precautions: Fall ?Restrictions ?Weight Bearing Restrictions: No  ? ?  ? ?Mobility ? Bed Mobility ?Overal bed mobility: Modified Independent ?  ?  ?  ?  ?  ?  ?  ?  ? ?Transfers ?Overall transfer level: Modified independent ?  ?  ?  ?  ?  ?  ?  ?  ?  ?  ? ?Ambulation/Gait ?Ambulation/Gait assistance: Supervision, Min guard ?Gait Distance (Feet): 65 Feet ?Assistive device: Straight cane ?Gait Pattern/deviations: Step-to pattern, Decreased step length - right, Decreased step length - left, Decreased stride length ?Gait velocity: decreased ?  ?  ?General Gait Details: slightly labored cadence with frequent leaning on side rails in hallway for support mostly due to c/o SOB, required standing rest break before returning to room, on room air with SpO2 at 93% ? ?Stairs ?  ?  ?  ?  ?  ? ?Wheelchair Mobility ?  ? ?Modified Rankin (Stroke Patients Only) ?  ? ?  ? ?Balance Overall balance assessment: Needs assistance ?Sitting-balance support: Feet supported, No upper extremity supported ?Sitting balance-Leahy Scale: Good ?Sitting balance - Comments: seated at EOB ?  ?Standing balance support: During functional activity, Single extremity supported ?Standing balance-Leahy Scale: Fair ?Standing balance comment: fair/good using SPC ?  ?  ?  ?  ?  ?  ?  ?  ?  ?  ?  ?   ? ? ? ?Pertinent Vitals/Pain Pain Assessment ?Pain Assessment: No/denies pain  ? ? ?  Home Living Family/patient expects to be discharged to:: Private residence ?Living Arrangements: Parent ?Available Help at Discharge: Family;Available 24 hours/day ?Type of Home: House ?Home Access: Ramped entrance ?  ?  ?  ?Home Layout: One level ?Home Equipment: Conservation officer, nature (2 wheels);Cane - single point;Shower seat - built in;BSC/3in1 ?   ?  ?Prior Function Prior Level of Function :  Independent/Modified Independent ?  ?  ?  ?  ?  ?  ?Mobility Comments: Lawyer SPC, drives ?ADLs Comments: Independent ?  ? ? ?Hand Dominance  ? Dominant Hand: Right ? ?  ?Extremity/Trunk Assessment  ? Upper Extremity Assessment ?Upper Extremity Assessment: Generalized weakness ?  ? ?Lower Extremity Assessment ?Lower Extremity Assessment: Generalized weakness ?  ? ?Cervical / Trunk Assessment ?Cervical / Trunk Assessment: Normal  ?Communication  ? Communication: No difficulties  ?Cognition Arousal/Alertness: Awake/alert ?Behavior During Therapy: [facility] for tasks assessed/performed ?Overall Cognitive Status: Within Functional Limits for tasks assessed ?  ?  ?  ?  ?  ?  ?  ?  ?  ?  ?  ?  ?  ?  ?  ?  ?  ?  ?  ? ?  ?General Comments   ? ?  ?Exercises    ? ?Assessment/Plan  ?  ?PT Assessment Patient needs continued PT services  ?PT Problem List Decreased strength;Decreased activity tolerance;Decreased balance;Decreased mobility ? ?   ?  ?PT Treatment Interventions DME instruction;Gait training;Stair training;Functional mobility training;Therapeutic activities;Therapeutic exercise;Patient/family education;Balance training   ? ?PT Goals (Current goals can be found in the Care Plan section)  ?Acute Rehab PT Goals ?Patient Stated Goal: return home with family to assist ?PT Goal Formulation: With patient ?Time For Goal Achievement: 07/23/21 ?Potential to Achieve Goals: Good ? ?  ?Frequency Min 2X/week ?  ? ? ?Co-evaluation   ?  ?  ?  ?  ? ? ?  ?AM-PAC PT "6 Clicks" Mobility  ?Outcome Measure Help needed turning from your back to your side while in a flat bed without using bedrails?: None ?Help needed moving from lying on your back to sitting on the side of a flat bed without using bedrails?: None ?Help needed moving to and from a bed to a chair (including a wheelchair)?: None ?Help needed standing up from a chair using your arms (e.g., wheelchair or bedside chair)?: None ?Help needed to walk in hospital room?:  A Little ?Help needed climbing 3-5 steps with a railing? : A Lot ?6 Click Score: 21 ? ?  ?End of Session Equipment Utilized During Treatment: Oxygen ?Activity Tolerance: Patient tolerated treatment well;Patient limited by fatigue ?Patient left: in bed;with call bell/phone within reach ?Nurse Communication: Mobility status ?PT Visit Diagnosis: Unsteadiness on feet (R26.81);Other abnormalities of gait and mobility (R26.89);Muscle weakness (generalized) (M62.81) ?  ? ?Time: 7628-3151 ?PT Time Calculation (min) (ACUTE ONLY): 30 min ? ? ?Charges:   PT Evaluation ?$PT Eval Moderate Complexity: 1 Mod ?PT Treatments ?$Therapeutic Activity: 23-37 mins ?  ?   ? ? ?4:37 PM, 07/19/21 ?Lonell Grandchild, MPT ?Physical Therapist with [place] ?[facility] ?[phone] office ?[phone] mobile phone ? ? ?

## 2021-07-20 DIAGNOSIS — I2609 Other pulmonary embolism with acute cor pulmonale: Secondary | ICD-10-CM | POA: Diagnosis not present

## 2021-07-20 LAB — GLUCOSE, CAPILLARY
Glucose-Capillary: 96 mg/dL (ref 70–99)
Glucose-Capillary: 99 mg/dL (ref 70–99)
Glucose-Capillary: 87 mg/dL (ref 70–99)
Glucose-Capillary: 157 mg/dL — ABNORMAL HIGH (ref 70–99)
Glucose-Capillary: 165 mg/dL — ABNORMAL HIGH (ref 70–99)

## 2021-07-20 LAB — CBC
HCT: 38.8 % (ref 36.0–46.0)
Hemoglobin: 11.9 g/dL — ABNORMAL LOW (ref 12.0–15.0)
MCH: 27.2 pg (ref 26.0–34.0)
MCHC: 30.7 g/dL (ref 30.0–36.0)
MCV: 88.8 fL (ref 80.0–100.0)
Platelets: 312 10*3/uL (ref 150–400)
RBC: 4.37 MIL/uL (ref 3.87–5.11)
RDW: 16 % — ABNORMAL HIGH (ref 11.5–15.5)
WBC: 4.8 10*3/uL (ref 4.0–10.5)
nRBC: 0 % (ref 0.0–0.2)

## 2021-07-20 LAB — BASIC METABOLIC PANEL
Anion gap: 7 (ref 5–15)
BUN: 11 mg/dL (ref 8–23)
CO2: 24 mmol/L (ref 22–32)
Calcium: 8.7 mg/dL — ABNORMAL LOW (ref 8.9–10.3)
Chloride: 113 mmol/L — ABNORMAL HIGH (ref 98–111)
Creatinine, Ser: 0.86 mg/dL (ref 0.44–1.00)
GFR, Estimated: >60 mL/min (ref >60)
Glucose, Bld: 93 mg/dL (ref 70–99)
Potassium: 3.7 mmol/L (ref 3.5–5.1)
Sodium: 144 mmol/L (ref 135–145)

## 2021-07-20 LAB — HEPARIN LEVEL (UNFRACTIONATED): Heparin Unfractionated: 0.53 IU/mL (ref 0.30–0.70)

## 2021-07-20 NOTE — TOC Initial Note (Signed)
Transition of Care (TOC) - Initial/Assessment Note  ? ? ?Patient Details  ?Name: Denise [name] ?MRN: [id number] ?Date of Birth: 19-May-1950 ? ?Transition of Care (TOC) CM/SW Contact:    ?Iona Beard, LCSWA ?Phone Number: ?07/20/2021, 11:21 AM ? ?Clinical Narrative:                 ?TOC updated that PT is recommending HH PT for pt at D/C. CSW spoke with pt about HH recommendation. Pt states that she is agreeable to being set up with [facility] as long as insurance covers it. CSW assured pt that her insurance will cover and we will get it all set up. TOC to follow.  ? ?Expected Discharge Plan: [place] ?Barriers to Discharge: Continued Medical Work up ? ? ?Patient Goals and CMS Choice ?Patient states their goals for this hospitalization and ongoing recovery are:: Home with HH ?CMS Medicare.gov Compare Post Acute Care list provided to:: Patient ?Choice offered to / list presented to : Patient ? ?Expected Discharge Plan and Services ?Expected Discharge Plan: [place] ?In-house Referral: Clinical Social Work ?  ?Post Acute Care Choice: Home Health ?Living arrangements for the past 2 months: [place] ?                ?  ?  ?  ?  ?  ?  ?  ?  ?  ?  ? ?Prior Living Arrangements/Services ?Living arrangements for the past 2 months: [place] ?Lives with:: Self ?Patient language and need for interpreter reviewed:: Yes ?Do you feel safe going back to the place where you live?: Yes      ?Need for Family Participation in Patient Care: Yes (Comment) ?Care giver support system in place?: Yes (comment) ?  ?Criminal Activity/Legal Involvement Pertinent to Current Situation/Hospitalization: No - Comment as needed ? ?Activities of Daily Living ?Home Assistive Devices/Equipment: Cane (specify quad or straight) ?ADL Screening (condition at time of admission) ?Patient's cognitive ability adequate to safely complete daily activities?: Yes ?Is the patient deaf or have difficulty hearing?:  No ?Does the patient have difficulty seeing, even when wearing glasses/contacts?: Yes (glasses) ?Does the patient have difficulty concentrating, remembering, or making decisions?: No ?Patient able to express need for assistance with ADLs?: Yes ?Does the patient have difficulty dressing or bathing?: No ?Independently performs ADLs?: Yes (appropriate for developmental age) ?Communication: Independent ?Is this a change from baseline?: Pre-admission baseline ?Dressing (OT): Independent ?Is this a change from baseline?: Pre-admission baseline ?Grooming: Independent ?Feeding: Independent ?Bathing: Independent ?Toileting: Independent with device (comment) (cane) ?In/Out Bed: Independent ?Walks in Home: Independent with device (comment) (cane) ?Does the patient have difficulty walking or climbing stairs?: No ?Weakness of Legs: None ?Weakness of Arms/Hands: None ? ?Permission Sought/Granted ?  ?  ?   ?   ?   ?   ? ?Emotional Assessment ?Appearance:: Appears stated age ?Attitude/Demeanor/Rapport: Engaged ?Affect (typically observed): Accepting ?Orientation: : Oriented to Self, Oriented to Place, Oriented to  Time, Oriented to Situation ?Alcohol / Substance Use: Not Applicable ?Psych Involvement: No (comment) ? ?Admission diagnosis:  Pulmonary embolus ([place]) [I26.99] ?Acute pulmonary embolism with acute cor pulmonale, unspecified pulmonary embolism type ([place]) [I26.09] ?Patient Active Problem List  ? Diagnosis Date Noted  ? Pulmonary embolus ([place]) 07/17/2021  ? Type 2 diabetes mellitus ([place])   ? Essential hypertension   ? History of lung cancer   ? Class 3 obesity (HCC)   ? Hypokalemia   ? Brain metastases  06/04/2020  ? Meningioma, cerebral ([place]) 05/18/2020  ? Abnormal myocardial perfusion study   ? Aortic stenosis   ? DOE (dyspnea on exertion) 10/09/2019  ? Meningioma ([place]) 03/20/2019  ? Port-A-Cath in place 01/06/2019  ? Adenocarcinoma of left lung ([place]) 12/09/2018  ? S/P shoulder replacement 01/21/2015  ? Expected blood loss  anemia 03/08/2012  ? Morbid obesity with BMI of 45.0-49.9, adult ([place]) 03/08/2012  ? S/P right THA, AA 03/07/2012  ? Small bowel obstruction ([place]) 04/11/2011  ? Incisional hernia with obstruction 04/11/2011  ? Pelvic mass 03/29/2011  ? Radicular low back pain 11/03/2010  ? Hip arthritis 11/03/2010  ? ?PCP:  Iona Beard, MD ?Pharmacy:   ?[id number] - [place], [place] [address] ?[place] [address] ?Phone: [phone] Fax: [phone] ? ? ? ? ?Social Determinants of Health (SDOH) Interventions ?  ? ?Readmission Risk Interventions ?   ? View : No data to display.  ?  ?  ?  ? ? ? ?

## 2021-07-20 NOTE — Progress Notes (Signed)
ANTICOAGULATION CONSULT NOTE - ? ?Pharmacy Consult for Heparin ?Indication: pulmonary embolus ? ?No Known Allergies ? ?Patient Measurements: ?Height: 5\' 2"  (157.5 cm) ?Weight: 117.7 kg (259 lb 7.7 oz) ?IBW/kg (Calculated) : 50.1 ?HEPARIN DW (KG): 79.2  ? ?Vital Signs: ?Temp: 97.9 ?F (36.6 ?C) (05/03 3545) ?BP: 120/57 (05/03 0610) ?Pulse Rate: 77 (05/03 0610) ? ?Labs: ?Recent Labs  ?  07/17/21 ?1320 07/17/21 ?1516 07/18/21 ?0107 07/18/21 ?6256 07/18/21 ?0815 07/18/21 ?1357 07/18/21 ?1908 07/19/21 ?0117 07/19/21 ?3893 07/20/21 ?0541  ?HGB 12.9  --   --  12.1  --   --   --   --  11.9* 11.9*  ?HCT 40.8  --   --  37.8  --   --   --   --  38.8 38.8  ?PLT 366  --   --  340  --   --   --   --  316 312  ?HEPARINUNFRC  --   --    < >  --    < >  --    < > 0.46 0.52 0.53  ?CREATININE 0.93  --   --  1.07*  --  1.03*  --   --   --  0.86  ?TROPONINIHS 56* 60*  --   --   --   --   --   --   --   --   ? < > = values in this interval not displayed.  ? ? ? ?Estimated Creatinine Clearance: 75.1 mL/min (by C-G formula based on SCr of 0.86 mg/dL). ? ? ?Medical History: ?Past Medical History:  ?Diagnosis Date  ? Anemia   ? Aortic stenosis   ? Arthritis   ? Cancer [facility])   ? Lung  ? Coronary artery calcification seen on CT scan   ? Essential hypertension   ? GERD (gastroesophageal reflux disease)   ? History of lung cancer   ? Stage III adenocarcinoma status post chemoradiation  ? Port-A-Cath in place 01/06/2019  ? Type 2 diabetes mellitus ([place])   ? ? ?Medications:  ?See med rec ? ?Assessment: ?with shortness of breath, she does have a history of lung cancer from several years ago, she went through chemo and radiation, notes that she has had some increasing swelling of her legs but now has a heaviness over her chest and a feeling like she gets severely short of breath with any exertion, has been going on for a couple of days, no fevers or chills, no nausea or vomiting.  CT scan shows  bilateral pulmonary embolism more on the right, there  is considered to be a submassive pulmonary embolism with right heart strain with RV to LV ratio of 1.8.   Patient is not on oral anticoagulants . Pharmacy asked to start heparin ? ?HL is 0.53, therapeutic. ? ?Goal of Therapy:  ?Heparin level 0.3-0.7 units/ml ?Monitor platelets by anticoagulation protocol: Yes ?  ?Plan:  ?Continue heparin infusion at 1500 units/hr ?Check anti-Xa level daily while on heparin ?Continue to monitor H&H and platelets ? ?Donna Christen Darlen Gledhill, PharmD, MBA ?Clinical Pharmacist ? ?07/20/2021,7:36 AM ? ? ?

## 2021-07-20 NOTE — Consult Note (Signed)
? ?NAME:  Denise [name], MRN:  [id number], DOB:  Sep 10, 1950, LOS: 3 ?ADMISSION DATE:  07/17/2021, CONSULTATION DATE:  07/20/2021 ? ?REFERRING MD:  Dyann Kief, TRH, CHIEF COMPLAINT:  resp distress, PE  ? ?History of Present Illness:  ?71 year old ex-smoker with a history of lung cancer admitted with 2 days of dyspnea and lower extremity edema ?CT angiogram chest showed Occlusive thrombi in segmental branches of right upper and lower lobes and nonocclusive right-sided thrombi with perhaps small emboli in the left lower lobe but no clots in the main pulmonary artery.  RV/LV ratio was 1.8 there was a right liver lesion 2 cm which was new compared to 2021 and slightly more apparent compared to previous imaging from February 2023. ? ?Lower extremity Dopplers negative for DVT ?2D echo obtained 07/19/2021 which showed acute cor pulmonale with severe pulm hypertension.  She was very dyspneic on minimal activity ?Hence PCCM consulted ? ?Pertinent  Medical History  ?Stage IIIb left adenocarcinoma lung diagnosed 11/2018, status post chemoradiation followed by durvalumab for 1 year until 07/2020 ?-Meningioma followed by neurosurgery ?-Type 2 diabetes ?-Hypertension ?-Aortic stenosis ? ?Significant Hospital Events: ?Including procedures, antibiotic start and stop dates in addition to other pertinent events   ? ? ?Interim History / Subjective:  ? ?Short of breath on minimal activity ?On 2 L nasal cannula ? ?Objective   ?Blood pressure (!) 120/57, pulse 77, temperature 97.9 ?F (36.6 ?C), resp. rate 18, height 5\' 2"  (1.575 m), weight 117.7 kg, SpO2 95 %. ?   ?   ? ?Intake/Output Summary (Last 24 hours) at 07/20/2021 1335 ?Last data filed at 07/20/2021 0900 ?Gross per 24 hour  ?Intake 720 ml  ?Output 500 ml  ?Net 220 ml  ? ?Filed Weights  ? 07/17/21 1247 07/18/21 0440  ?Weight: 117.9 kg 117.7 kg  ? ? ?Examination: ?General: Obese female lying supine in bed, no distress, able to speak in full sentences ?HENT: No JVD, mild pallor, no icterus ?Lungs:  Decreased breath sounds bilateral, no accessory muscle use ?Cardiovascular: S1-S2 distant, no murmur ?Abdomen: Soft, obese abdomen no guarding or tenderness ?Extremities: No deformity, no edema ?Neuro: Alert oriented x3, nonfocal ? ? ?Labs show mild hypokalemia, no leukocytosis ? ?Resolved Hospital Problem list   ? ? ?Assessment & Plan:  ?Acute pulmonary embolism ?-Appears to be distal no evidence of clots in the main pulmonary arteries , concern for CTEPH ?-Acute cor pulmonale -previous echo from 07/2020 had estimated RVSP at 43 and now appears to be severely elevated ?-Underlying stage IIIb adenocarcinoma lung which was supposed to be in remission based on CT from February but now appears to have a new liver lesion ?Moderate aortic stenosis ? ?Recommend ?-Continue IV heparin for a longer duration 4 to 5 days before transitioning to DOAC, worthwhile considering Lovenox as an option given underlying history of cancer ?-Check ambulatory saturation on discharge for oxygen requirements ?-Since emboli are distal, she is not a candidate for catheter directed lysis or aspiration procedure ?-Will need outpatient evaluation of liver lesion for metastases, MRI recommended by radiology, defer to oncology to schedule ?-Follow-up echo in 6 to 8 weeks to see if PA pressures have decreased, if not then consider the possibility of CTEPH however therapeutic options may be limited ? ?Best Practice (right click and "Reselect all SmartList Selections" daily)  ? ?Per primary ?Code Status:  full code ?Last date of multidisciplinary goals of care discussion [NA] ? ?Labs   ?CBC: ?Recent Labs  ?Lab 07/17/21 ?1320 07/18/21 ?0427 07/19/21 ?6644 07/20/21 ?  0541  ?WBC 5.9 6.8 6.0 4.8  ?NEUTROABS 4.7  --   --   --   ?HGB 12.9 12.1 11.9* 11.9*  ?HCT 40.8 37.8 38.8 38.8  ?MCV 86.6 87.9 89.8 88.8  ?PLT 366 340 316 312  ? ? ?Basic Metabolic Panel: ?Recent Labs  ?Lab 07/17/21 ?1320 07/18/21 ?0427 07/18/21 ?1357 07/20/21 ?0541  ?NA 142 144 144 144  ?K  3.1* 3.8 4.3 3.7  ?CL 111 112* 116* 113*  ?CO2 22 26 19* 24  ?GLUCOSE 119* 104* 99 93  ?BUN 10 14 16 11   ?CREATININE 0.93 1.07* 1.03* 0.86  ?CALCIUM 8.9 8.4* 8.7* 8.7*  ?MG  --  1.9  --   --   ? ?GFR: ?Estimated Creatinine Clearance: 75.1 mL/min (by C-G formula based on SCr of 0.86 mg/dL). ?Recent Labs  ?Lab 07/17/21 ?1320 07/18/21 ?0427 07/19/21 ?8786 07/20/21 ?0541  ?WBC 5.9 6.8 6.0 4.8  ? ? ?Liver Function Tests: ?Recent Labs  ?Lab 07/17/21 ?1320  ?AST 29  ?ALT 45*  ?ALKPHOS 93  ?BILITOT 0.4  ?PROT 6.6  ?ALBUMIN 3.7  ? ?No results for input(s): LIPASE, AMYLASE in the last 168 hours. ?No results for input(s): AMMONIA in the last 168 hours. ? ?ABG ?   ?Component Value Date/Time  ? PHART 7.402 12/29/2019 0813  ? PCO2ART 42.0 12/29/2019 0813  ? PO2ART 117 (H) 12/29/2019 0813  ? HCO3 26.2 12/29/2019 0813  ? TCO2 27 12/29/2019 0813  ? O2SAT 99.0 12/29/2019 0813  ?  ? ?Coagulation Profile: ?No results for input(s): INR, PROTIME in the last 168 hours. ? ?Cardiac Enzymes: ?No results for input(s): CKTOTAL, CKMB, CKMBINDEX, TROPONINI in the last 168 hours. ? ?HbA1C: ?Hgb A1c MFr Bld  ?Date/Time Value Ref Range Status  ?07/17/2021 01:20 PM 5.9 (H) 4.8 - 5.6 % Final  ?  Comment:  ?  (NOTE) ?Pre diabetes:          5.7%-6.4% ? ?Diabetes:              >6.4% ? ?Glycemic control for   <7.0% ?adults with diabetes ?  ?10/08/2020 11:43 AM 6.4 (H) 4.8 - 5.6 % Final  ?  Comment:  ?  (NOTE) ?        Prediabetes: 5.7 - 6.4 ?        Diabetes: >6.4 ?        Glycemic control for adults with diabetes: <7.0 ?  ? ? ?CBG: ?Recent Labs  ?Lab 07/19/21 ?1115 07/19/21 ?1614 07/19/21 ?2151 07/20/21 ?0719 07/20/21 ?1109  ?GLUCAP 138* 88 148* 96 99  ? ? ?Review of Systems:   ?Shortness of breath ?Chest pain ? ? ?Constitutional: negative for anorexia, fevers and sweats  ?Eyes: negative for irritation, redness and visual disturbance  ?Ears, nose, mouth, throat, and face: negative for earaches, epistaxis, nasal congestion and sore throat  ?Respiratory:  negative for cough, sputum and wheezing  ?Cardiovascular: negative for  lower extremity edema, orthopnea, palpitations and syncope  ?Gastrointestinal: negative for abdominal pain, constipation, diarrhea, melena, nausea and vomiting  ?Genitourinary:negative for dysuria, frequency and hematuria  ?Hematologic/lymphatic: negative for bleeding, easy bruising and lymphadenopathy  ?Musculoskeletal:negative for arthralgias, muscle weakness and stiff joints  ?Neurological: negative for coordination problems, gait problems, headaches and weakness  ?Endocrine: negative for diabetic symptoms including polydipsia, polyuria and weight loss ? ? ?Past Medical History:  ?She,  has a past medical history of Anemia, Aortic stenosis, Arthritis, Cancer ([place]), Coronary artery calcification seen on CT scan, Essential hypertension, GERD (gastroesophageal reflux  disease), History of lung cancer, Port-A-Cath in place (01/06/2019), and Type 2 diabetes mellitus ([place]).  ? ?Surgical History:  ? ?Past Surgical History:  ?Procedure Laterality Date  ? CHOLECYSTECTOMY  1997  ? COLONOSCOPY    ? GASTRIC BYPASS    ? INCISIONAL HERNIA REPAIR  04/11/11  ? IR IMAGING GUIDED PORT INSERTION  12/27/2018  ? Right  ? LAPAROSCOPIC SALPINGOOPHERECTOMY    ? LAPAROTOMY  04/11/2011  ? Procedure: EXPLORATORY LAPAROTOMY;  Surgeon: Joyice Faster. Cornett, MD;  Location: [facility];  Service: General;  Laterality: N/A;  closure port hole  ? RIGHT/LEFT HEART CATH AND CORONARY ANGIOGRAPHY N/A 12/29/2019  ? Procedure: RIGHT/LEFT HEART CATH AND CORONARY ANGIOGRAPHY;  Surgeon: Troy Sine, MD;  Location: [facility];  Service: Cardiovascular;  Laterality: N/A;  ? TOTAL HIP ARTHROPLASTY  03/07/2012  ? Procedure: TOTAL HIP ARTHROPLASTY ANTERIOR APPROACH;  Surgeon: Mauri Pole, MD;  Location: [facility];  Service: Orthopedics;  Laterality: Right;  ? TOTAL SHOULDER ARTHROPLASTY Left 01/21/2015  ? TOTAL SHOULDER ARTHROPLASTY Left 01/21/2015  ? Procedure: LEFT TOTAL SHOULDER  ARTHROPLASTY;  Surgeon: Justice Britain, MD;  Location: [facility];  Service: Orthopedics;  Laterality: Left;  ? VAGINAL HYSTERECTOMY    ?  ? ?Social History:  ? reports that she quit smoking about 45 years ago. Her smo

## 2021-07-20 NOTE — Progress Notes (Signed)
?PROGRESS NOTE ? ? ? ?Denise [name]  GGY:[id number] DOB: 1950-10-23 DOA: 07/17/2021 ?PCP: Iona Beard, MD  ? ?  ?Brief Narrative:  ?Denise [name] is a 71 y.o. female with medical history significant of lung cancer sp chemotherapy and radiation who presented with dyspnea. Reported 2 days of worsening dyspnea on exertion, to the point where she is symptomatic with minimal efforts, no chest pain, cough or hemoptysis.  ? ?Patient walks with the help of a cane because right knee pain, able to do activities of daily living when at baseline. 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  ?  ?Work-up revealed acute PE with right heart strain.  Patient was started on IV heparin.  ? ?New events last 24 hours / Subjective: ?Patient is very short of breath after getting up to the restroom.  About 5 minutes to catch her breath. Denies any chest pains but does have some central chest pressures ? ?Assessment & Plan: ?  ?Principal Problem: ?  Pulmonary embolus ([place]) ?Active Problems: ?  Essential hypertension ?  Type 2 diabetes mellitus ([place]) ?  Aortic stenosis ?  Hypokalemia ?  History of lung cancer ?  Class 3 obesity ([facility]) ? ?PE ?-With evidence of right heart strain, echocardiogram also confirmed right ventricular failure with pulmonary hypertension.  This was discussed with PCCM and they recommended to continue IV heparin for total 5 days (4/30 to 5/4).  She will need repeat echocardiogram in 3 months. ?-Lower extremity Dopplers negative for DVT ?-Plan to transition to Eliquis on 5/5 ? ?Hypertension ?-BP remains stable at this time.  Norvasc, Coreg on hold ? ?Diabetes mellitus type 2 with neuropathy ?-Continue sliding scale insulin, gabapentin ? ?History of lung cancer, adenocarcinoma status post chemo and radiation ?-Follow-up with oncology, Dr. Delton Coombes ? ?Obesity ?-Estimated body mass index is 47.46 kg/m? as calculated from the following: ?  Height as of  this encounter: 5\' 2"  (1.575 m). ?  Weight as of this encounter: 117.7 kg. ? ? ?DVT prophylaxis: IV heparin ? ? ?Code Status: Full code ?Family Communication: No family at bedside ?Disposition Plan:  ?Status is: Inpatient ?Remains inpatient appropriate because: IV heparin ? ? ? ? ?Antimicrobials:  ?Anti-infectives (From admission, onward)  ? ? None  ? ?  ? ? ? ?Objective: ?Vitals:  ? 07/19/21 0917 07/19/21 1226 07/19/21 2153 07/20/21 0610  ?BP: 98/70 126/67 117/60 (!) 120/57  ?Pulse: 89 79 80 77  ?Resp: 20 (!) 22 19 18   ?Temp: 98.4 ?F (36.9 ?C) 97.8 ?F (36.6 ?C) 97.8 ?F (36.6 ?C) 97.9 ?F (36.6 ?C)  ?TempSrc:      ?SpO2: 97% 95% 96% 95%  ?Weight:      ?Height:      ? ? ?Intake/Output Summary (Last 24 hours) at 07/20/2021 1204 ?Last data filed at 07/20/2021 0900 ?Gross per 24 hour  ?Intake 960 ml  ?Output 500 ml  ?Net 460 ml  ? ?Filed Weights  ? 07/17/21 1247 07/18/21 0440  ?Weight: 117.9 kg 117.7 kg  ? ? ?Examination:  ?General exam: Appears calm and comfortable  ?Respiratory system: Clear to auscultation.  Respiratory effort increased and has mild conversational dyspnea, on nasal cannula O2 ?Cardiovascular system: S1 & S2 heard, tachycardic, regular rhythm. No murmurs. No pedal edema. ?Gastrointestinal system: Abdomen is nondistended, soft and nontender. Normal bowel sounds heard. ?Central nervous system: Alert and oriented. No focal neurological deficits. Speech clear.  ?Extremities: Symmetric in  appearance  ?Skin: No rashes, lesions or ulcers on exposed skin  ?Psychiatry: Judgement and insight appear normal. Mood & affect appropriate.  ? ?Data Reviewed: I have personally reviewed following labs and imaging studies ? ?CBC: ?Recent Labs  ?Lab 07/17/21 ?1320 07/18/21 ?0427 07/19/21 ?6720 07/20/21 ?0541  ?WBC 5.9 6.8 6.0 4.8  ?NEUTROABS 4.7  --   --   --   ?HGB 12.9 12.1 11.9* 11.9*  ?HCT 40.8 37.8 38.8 38.8  ?MCV 86.6 87.9 89.8 88.8  ?PLT 366 340 316 312  ? ?Basic Metabolic Panel: ?Recent Labs  ?Lab 07/17/21 ?1320  07/18/21 ?0427 07/18/21 ?1357 07/20/21 ?0541  ?NA 142 144 144 144  ?K 3.1* 3.8 4.3 3.7  ?CL 111 112* 116* 113*  ?CO2 22 26 19* 24  ?GLUCOSE 119* 104* 99 93  ?BUN 10 14 16 11   ?CREATININE 0.93 1.07* 1.03* 0.86  ?CALCIUM 8.9 8.4* 8.7* 8.7*  ?MG  --  1.9  --   --   ? ?GFR: ?Estimated Creatinine Clearance: 75.1 mL/min (by C-G formula based on SCr of 0.86 mg/dL). ?Liver Function Tests: ?Recent Labs  ?Lab 07/17/21 ?1320  ?AST 29  ?ALT 45*  ?ALKPHOS 93  ?BILITOT 0.4  ?PROT 6.6  ?ALBUMIN 3.7  ? ?No results for input(s): LIPASE, AMYLASE in the last 168 hours. ?No results for input(s): AMMONIA in the last 168 hours. ?Coagulation Profile: ?No results for input(s): INR, PROTIME in the last 168 hours. ?Cardiac Enzymes: ?No results for input(s): CKTOTAL, CKMB, CKMBINDEX, TROPONINI in the last 168 hours. ?BNP (last 3 results) ?No results for input(s): PROBNP in the last 8760 hours. ?HbA1C: ?Recent Labs  ?  07/17/21 ?1320  ?HGBA1C 5.9*  ? ?CBG: ?Recent Labs  ?Lab 07/19/21 ?1115 07/19/21 ?1614 07/19/21 ?2151 07/20/21 ?0719 07/20/21 ?1109  ?GLUCAP 138* 88 148* 96 99  ? ?Lipid Profile: ?No results for input(s): CHOL, HDL, LDLCALC, TRIG, CHOLHDL, LDLDIRECT in the last 72 hours. ?Thyroid Function Tests: ?No results for input(s): TSH, T4TOTAL, FREET4, T3FREE, THYROIDAB in the last 72 hours. ?Anemia Panel: ?No results for input(s): VITAMINB12, FOLATE, FERRITIN, TIBC, IRON, RETICCTPCT in the last 72 hours. ?Sepsis Labs: ?No results for input(s): PROCALCITON, LATICACIDVEN in the last 168 hours. ? ?Recent Results (from the past 240 hour(s))  ?MRSA Next Gen by PCR, Nasal     Status: None  ? Collection Time: 07/17/21  6:58 PM  ? Specimen: Nasal Mucosa; Nasal Swab  ?Result Value Ref Range Status  ? MRSA by PCR Next Gen NOT DETECTED NOT DETECTED Final  ?  Comment: (NOTE) ?The GeneXpert MRSA Assay (FDA approved for NASAL specimens only), ?is one component of a comprehensive MRSA colonization surveillance ?program. It is not intended to diagnose  MRSA infection nor to guide ?or monitor treatment for MRSA infections. ?Test performance is not FDA approved in patients less than 2 years ?old. ?Performed at [facility], [address], [place], [place] [address] ?  ?  ? ? ?Radiology Studies: ?ECHOCARDIOGRAM COMPLETE ? ?Result Date: 07/18/2021 ?   ECHOCARDIOGRAM REPORT   Patient Name:   DAJAH FISCHMAN Date of Exam: 07/18/2021 Medical Rec #:  [id number]   Height:       62.0 in Accession #:    [id number]  Weight:       259.5 lb Date of Birth:  1950/11/25    BSA:          2.135 m? Patient Age:    60 years    BP:  107/65 mmHg Patient Gender: F           HR:           76 bpm. Exam Location:  [facility] Procedure: 2D Echo, Color Doppler and Cardiac Doppler Indications:    Dyspnea  History:        Patient has prior history of Echocardiogram examinations, most                 recent 08/03/2020. Risk Factors:Diabetes, Hypertension and Former                 Smoker. Pulmonary Embolus; Lung Cancer.  Sonographer:    Leavy Cella RDCS Referring Phys: [id number] [place]  1. Left ventricular ejection fraction, by estimation, is 65 to 70%. The left ventricle has normal function. Left ventricular endocardial border not optimally defined to evaluate regional wall motion. There is severe left ventricular hypertrophy. Left ventricular diastolic parameters are consistent with Grade I diastolic dysfunction (impaired relaxation).  2. Ventricular septum is flattened in systole consistent with RV pressure overload. . Right ventricular systolic function moderately to severely reduced. The right ventricular size is severely enlarged. There is severely elevated pulmonary artery systolic pressure.  3. Right atrial size was severely dilated.  4. The mitral valve was not well visualized. No evidence of mitral valve regurgitation. No evidence of mitral stenosis.  5. The tricuspid valve is abnormal. Tricuspid valve regurgitation is moderate.  6. The aortic valve has an  indeterminant number of cusps. There is moderate calcification of the aortic valve. There is moderate thickening of the aortic valve. Aortic valve regurgitation is mild. Moderate aortic valve stenosis. Aortic va

## 2021-07-21 DIAGNOSIS — I2609 Other pulmonary embolism with acute cor pulmonale: Secondary | ICD-10-CM | POA: Diagnosis not present

## 2021-07-21 LAB — CBC
HCT: 37.8 % (ref 36.0–46.0)
Hemoglobin: 11.5 g/dL — ABNORMAL LOW (ref 12.0–15.0)
MCH: 27.3 pg (ref 26.0–34.0)
MCHC: 30.4 g/dL (ref 30.0–36.0)
MCV: 89.8 fL (ref 80.0–100.0)
Platelets: 316 10*3/uL (ref 150–400)
RBC: 4.21 MIL/uL (ref 3.87–5.11)
RDW: 16.2 % — ABNORMAL HIGH (ref 11.5–15.5)
WBC: 5.8 10*3/uL (ref 4.0–10.5)
nRBC: 0 % (ref 0.0–0.2)

## 2021-07-21 LAB — GLUCOSE, CAPILLARY
Glucose-Capillary: 105 mg/dL — ABNORMAL HIGH (ref 70–99)
Glucose-Capillary: 109 mg/dL — ABNORMAL HIGH (ref 70–99)
Glucose-Capillary: 84 mg/dL (ref 70–99)
Glucose-Capillary: 139 mg/dL — ABNORMAL HIGH (ref 70–99)

## 2021-07-21 LAB — HEPARIN LEVEL (UNFRACTIONATED): Heparin Unfractionated: 0.51 IU/mL (ref 0.30–0.70)

## 2021-07-21 MED ORDER — CHLORHEXIDINE GLUCONATE CLOTH 2 % EX PADS
6.0000 | MEDICATED_PAD | Freq: Every day | CUTANEOUS | Status: DC
  Administered 2021-07-21 – 2021-07-22 (×2): 6 via TOPICAL

## 2021-07-21 NOTE — Progress Notes (Signed)
Getting a ?PROGRESS NOTE ? ? ? ?Denise [name]  OEV:[id number] DOB: 08-21-1950 DOA: 07/17/2021 ?PCP: Iona Beard, MD  ? ?  ?Brief Narrative:  ?Denise [name] is a 71 y.o. female with medical history significant of lung cancer sp chemotherapy and radiation who presented with dyspnea. Reported 2 days of worsening dyspnea on exertion, to the point where she is symptomatic with minimal efforts, no chest pain, cough or hemoptysis.  ? ?Patient walks with the help of a cane because right knee pain, able to do activities of daily living when at baseline. 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  ?  ?Work-up revealed acute PE with right heart strain.  Patient was started on IV heparin.  ? ?New events last 24 hours / Subjective: ?Patient without any new complaints at rest.  Continues to get very winded to walk short distances. ? ?Assessment & Plan: ?  ?Principal Problem: ?  Pulmonary embolus ([place]) ?Active Problems: ?  Essential hypertension ?  Type 2 diabetes mellitus ([place]) ?  Aortic stenosis ?  Hypokalemia ?  History of lung cancer ?  Class 3 obesity ([facility]) ? ?PE ?-With evidence of right heart strain, echocardiogram also confirmed right ventricular failure with pulmonary hypertension.  This was discussed with PCCM and they recommended to continue IV heparin for total 5 days (4/30 to 5/4).  She will need repeat echocardiogram in 3 months. ?-Lower extremity Dopplers negative for DVT ?-Plan to transition to Eliquis versus Lovenox on 5/5 ? ?Hypertension ?-BP remains stable at this time.  Norvasc, Coreg on hold ? ?Diabetes mellitus type 2 with neuropathy ?-Continue sliding scale insulin, gabapentin ? ?History of lung cancer, adenocarcinoma status post chemo and radiation ?-Follow-up with oncology, Dr. Delton Coombes ? ?Obesity ?-Estimated body mass index is 47.46 kg/m? as calculated from the following: ?  Height as of this encounter: 5\' 2"  (1.575 m). ?  Weight as of  this encounter: 117.7 kg. ? ? ?DVT prophylaxis: IV heparin ? ? ?Code Status: Full code ?Family Communication: No family at bedside ?Disposition Plan:  ?Status is: Inpatient ?Remains inpatient appropriate because: IV heparin ? ? ? ? ?Antimicrobials:  ?Anti-infectives (From admission, onward)  ? ? None  ? ?  ? ? ? ?Objective: ?Vitals:  ? 07/20/21 0610 07/20/21 1350 07/20/21 2115 07/21/21 0456  ?BP: (!) 120/57 102/75 (!) 117/52 (!) 100/53  ?Pulse: 77 85 95 70  ?Resp: 18 17 20 20   ?Temp: 97.9 ?F (36.6 ?C)  98.2 ?F (36.8 ?C) 97.6 ?F (36.4 ?C)  ?TempSrc:    Oral  ?SpO2: 95% 95% 96% 96%  ?Weight:      ?Height:      ? ? ?Intake/Output Summary (Last 24 hours) at 07/21/2021 1110 ?Last data filed at 07/21/2021 0900 ?Gross per 24 hour  ?Intake 1416.64 ml  ?Output 800 ml  ?Net 616.64 ml  ? ? ?Filed Weights  ? 07/17/21 1247 07/18/21 0440  ?Weight: 117.9 kg 117.7 kg  ? ? ?Examination:  ?General exam: Appears calm and comfortable  ?Respiratory system: Clear to auscultation.  No conversational dyspnea.  On 2 L oxygen ?Cardiovascular system: S1 & S2 heard, regular rate and rhythm, + systolic murmur ?Gastrointestinal system: Abdomen is nondistended, soft and nontender. Normal bowel sounds heard. ?Central nervous system: Alert and oriented. No focal neurological deficits. Speech clear.  ?Extremities: Symmetric in appearance  ?Skin: No rashes, lesions or ulcers on exposed skin  ?Psychiatry: Judgement and insight  appear normal. Mood & affect appropriate.  ? ?Data Reviewed: I have personally reviewed following labs and imaging studies ? ?CBC: ?Recent Labs  ?Lab 07/17/21 ?1320 07/18/21 ?0427 07/19/21 ?0092 07/20/21 ?3300 07/21/21 ?0512  ?WBC 5.9 6.8 6.0 4.8 5.8  ?NEUTROABS 4.7  --   --   --   --   ?HGB 12.9 12.1 11.9* 11.9* 11.5*  ?HCT 40.8 37.8 38.8 38.8 37.8  ?MCV 86.6 87.9 89.8 88.8 89.8  ?PLT 366 340 316 312 316  ? ? ?Basic Metabolic Panel: ?Recent Labs  ?Lab 07/17/21 ?1320 07/18/21 ?0427 07/18/21 ?1357 07/20/21 ?0541  ?NA 142 144 144 144   ?K 3.1* 3.8 4.3 3.7  ?CL 111 112* 116* 113*  ?CO2 22 26 19* 24  ?GLUCOSE 119* 104* 99 93  ?BUN 10 14 16 11   ?CREATININE 0.93 1.07* 1.03* 0.86  ?CALCIUM 8.9 8.4* 8.7* 8.7*  ?MG  --  1.9  --   --   ? ? ?GFR: ?Estimated Creatinine Clearance: 75.1 mL/min (by C-G formula based on SCr of 0.86 mg/dL). ?Liver Function Tests: ?Recent Labs  ?Lab 07/17/21 ?1320  ?AST 29  ?ALT 45*  ?ALKPHOS 93  ?BILITOT 0.4  ?PROT 6.6  ?ALBUMIN 3.7  ? ? ?No results for input(s): LIPASE, AMYLASE in the last 168 hours. ?No results for input(s): AMMONIA in the last 168 hours. ?Coagulation Profile: ?No results for input(s): INR, PROTIME in the last 168 hours. ?Cardiac Enzymes: ?No results for input(s): CKTOTAL, CKMB, CKMBINDEX, TROPONINI in the last 168 hours. ?BNP (last 3 results) ?No results for input(s): PROBNP in the last 8760 hours. ?HbA1C: ?No results for input(s): HGBA1C in the last 72 hours. ? ?CBG: ?Recent Labs  ?Lab 07/20/21 ?1109 07/20/21 ?1616 07/20/21 ?2118 07/20/21 ?2121 07/21/21 ?7622  ?GLUCAP 99 87 165* 157* 105*  ? ? ?Lipid Profile: ?No results for input(s): CHOL, HDL, LDLCALC, TRIG, CHOLHDL, LDLDIRECT in the last 72 hours. ?Thyroid Function Tests: ?No results for input(s): TSH, T4TOTAL, FREET4, T3FREE, THYROIDAB in the last 72 hours. ?Anemia Panel: ?No results for input(s): VITAMINB12, FOLATE, FERRITIN, TIBC, IRON, RETICCTPCT in the last 72 hours. ?Sepsis Labs: ?No results for input(s): PROCALCITON, LATICACIDVEN in the last 168 hours. ? ?Recent Results (from the past 240 hour(s))  ?MRSA Next Gen by PCR, Nasal     Status: None  ? Collection Time: 07/17/21  6:58 PM  ? Specimen: Nasal Mucosa; Nasal Swab  ?Result Value Ref Range Status  ? MRSA by PCR Next Gen NOT DETECTED NOT DETECTED Final  ?  Comment: (NOTE) ?The GeneXpert MRSA Assay (FDA approved for NASAL specimens only), ?is one component of a comprehensive MRSA colonization surveillance ?program. It is not intended to diagnose MRSA infection nor to guide ?or monitor treatment  for MRSA infections. ?Test performance is not FDA approved in patients less than 2 years ?old. ?Performed at [facility], [address], [place], [place] [address] ?  ? ?  ? ? ?Radiology Studies: ?No results found. ? ? ? ?Scheduled Meds: ? celecoxib  200 mg Oral BID  ? Chlorhexidine Gluconate Cloth  6 each Topical Daily  ? ferrous sulfate  325 mg Oral QPM  ? gabapentin  300 mg Oral BID  ? insulin aspart  0-9 Units Subcutaneous TID WC  ? mouth rinse  15 mL Mouth Rinse BID  ? multivitamin with minerals  1 tablet Oral Daily  ? pantoprazole  40 mg Oral Daily  ? ?Continuous Infusions: ? heparin 1,500 Units/hr (07/21/21 1011)  ? ? ?  LOS: 4 days  ? ? ? ?Dessa Phi, DO ?[facility] ?07/21/2021, 11:10 AM  ? ?Available via Epic secure chat 7am-7pm ?After these hours, please refer to coverage provider listed on [email or web address] ? ?

## 2021-07-21 NOTE — Progress Notes (Signed)
Physical Therapy Treatment ?Patient Details ?Name: Denise [name] ?MRN: [id number] ?DOB: Jul 20, 1950 ?Today's Date: 07/21/2021 ? ? ?History of Present Illness Denise [name] is a 71 y.o. female with medical history significant of lung cancer sp chemotherapy and radiation who presented with dyspnea. Reported 2 days of worsening dyspnea on exertion, to the point where she is symptomatic with minimal efforts, no chest pain, cough or hemoptysis.   Patient walks with the help of a cane because right knee pain, able to do activities of daily living when at baseline. 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 ? ?  ?PT Comments  ? ? Patient sitting in chair on room air when entered. Patient agreeable to participating the PT session today.  Patient on 2 LPM O2 during ambulation utilizing IV pole. Patient exhibited shortness of breath and dyspnea on exertion. Patient guided to take a standing rest break x2 leaning on hallway railing. Patient's supplemental oxygen was increased to 4 LPM O2 until returning to room and performing deep breathing to recover. Patient was educated on self pacing and energy conservation techniques to perform at home to improve her functional abilities and decrease the severity of shortness of breath. At end of session, family member was present. Patient's was on 2 LPM O2 on the wall through nasal cannula and was comfortable. ?  ?Recommendations for follow up therapy are one component of a multi-disciplinary discharge planning process, led by the attending physician.  Recommendations may be updated based on patient status, additional functional criteria and insurance authorization. ? ?Follow Up Recommendations ? Home health PT ?  ?  ?Assistance Recommended at Discharge PRN  ?Patient can return home with the following A little help with walking and/or transfers;Assistance with cooking/housework;Help with stairs or ramp for entrance ?   ?Equipment Recommendations ? None recommended by PT  ?  ?Recommendations for Other Services   ? ? ?  ?Precautions / Restrictions Precautions ?Precautions: Fall ?Restrictions ?Weight Bearing Restrictions: No  ?  ? ?Mobility ? Bed Mobility ?  ?  ?General bed mobility comments: patient in chair at beginning and end of session ?  ? ?Transfers ?Overall transfer level: Modified independent ?Equipment used: Ambulation equipment used ?  ?  ? ?Ambulation/Gait ?Ambulation/Gait assistance: Supervision, Min guard ?Gait Distance (Feet): 250 Feet ?Assistive device: IV Pole ?Gait Pattern/deviations: Decreased step length - right, Decreased step length - left, Decreased stride length, Step-through pattern, Wide base of support ?Gait velocity: decreased ?  ?  ?General Gait Details: slightly labored cadence with leaning on side rails in hallway for support x2 mostly due to c/o SOB, required standing rest break before returning to room, on 2 LPM O2 increased to 4 LPM toward end of ambulation and recovery. ? ? ?Stairs ?  ?  ?  ?  ?  ? ? ?Wheelchair Mobility ?  ? ?Modified Rankin (Stroke Patients Only) ?  ? ? ?  ?Balance Overall balance assessment: Needs assistance ?Sitting-balance support: Feet supported, No upper extremity supported ?Sitting balance-Leahy Scale: Good ?Sitting balance - Comments: seated in chair ?  ?Standing balance support: During functional activity, Single extremity supported ?Standing balance-Leahy Scale: Fair ?Standing balance comment: fair/good using SPC ?  ?  ? ?  ?Cognition Arousal/Alertness: Awake/alert ?Behavior During Therapy: [facility] for tasks assessed/performed ?Overall Cognitive Status: Within Functional Limits for tasks assessed ?  ?  ?  ? ?  ?Exercises   ? ?  ?General Comments   ?  ?  ? ?  Pertinent Vitals/Pain Pain Assessment ?Pain Assessment: No/denies pain  ? ? ?Home Living   ?  ?  ?  ?  ?  ?Prior Function    ?  ?  ?   ? ?PT Goals (current goals can now be found in the care plan section) Acute Rehab PT  Goals ?Patient Stated Goal: return home with family to assist ?PT Goal Formulation: With patient ?Time For Goal Achievement: 07/23/21 ?Potential to Achieve Goals: Good ?Progress towards PT goals: Progressing toward goals ? ?  ?Frequency ? ? ? Min 2X/week ? ? ? ?  ?PT Plan Current plan remains appropriate  ? ? ?   ?AM-PAC PT "6 Clicks" Mobility   ?Outcome Measure ? Help needed turning from your back to your side while in a flat bed without using bedrails?: None ?Help needed moving from lying on your back to sitting on the side of a flat bed without using bedrails?: None ?Help needed moving to and from a bed to a chair (including a wheelchair)?: None ?Help needed standing up from a chair using your arms (e.g., wheelchair or bedside chair)?: None ?Help needed to walk in hospital room?: A Little ?Help needed climbing 3-5 steps with a railing? : A Lot ?6 Click Score: 21 ? ?  ?End of Session Equipment Utilized During Treatment: Oxygen ?Activity Tolerance: Patient tolerated treatment well;Patient limited by fatigue ?Patient left: with call bell/phone within reach;in chair;with family/visitor present ?Nurse Communication: Mobility status ?PT Visit Diagnosis: Unsteadiness on feet (R26.81);Other abnormalities of gait and mobility (R26.89);Muscle weakness (generalized) (M62.81) ?  ? ? ?Time: 7195-9747 ?PT Time Calculation (min) (ACUTE ONLY): 23 min ? ?Charges:  $Therapeutic Activity: 8-22 mins ?$Self Care/Home Management: 8-22          ?          ? ?Floria Raveling. Hartnett-Rands, MS, PT ?Per [place] ?[place] #[id number] ? ?Roselee Tayloe  Hartnett-Rands ?07/21/2021, 12:26 PM ? ?

## 2021-07-21 NOTE — Progress Notes (Signed)
ANTICOAGULATION CONSULT NOTE - ? ?Pharmacy Consult for Heparin ?Indication: pulmonary embolus ? ?No Known Allergies ? ?Patient Measurements: ?Height: 5\' 2"  (157.5 cm) ?Weight: 117.7 kg (259 lb 7.7 oz) ?IBW/kg (Calculated) : 50.1 ?HEPARIN DW (KG): 79.2  ? ?Vital Signs: ?Temp: 97.6 ?F (36.4 ?C) (05/04 0456) ?Temp Source: Oral (05/04 0456) ?BP: 100/53 (05/04 0456) ?Pulse Rate: 70 (05/04 0456) ? ?Labs: ?Recent Labs  ?  07/18/21 ?1357 07/18/21 ?1908 07/19/21 ?5974 07/20/21 ?1638 07/21/21 ?0512  ?HGB  --    < > 11.9* 11.9* 11.5*  ?HCT  --   --  38.8 38.8 37.8  ?PLT  --   --  316 312 316  ?HEPARINUNFRC  --    < > 0.52 0.53 0.51  ?CREATININE 1.03*  --   --  0.86  --   ? < > = values in this interval not displayed.  ? ? ? ?Estimated Creatinine Clearance: 75.1 mL/min (by C-G formula based on SCr of 0.86 mg/dL). ? ? ?Medical History: ?Past Medical History:  ?Diagnosis Date  ? Anemia   ? Aortic stenosis   ? Arthritis   ? Cancer [facility])   ? Lung  ? Coronary artery calcification seen on CT scan   ? Essential hypertension   ? GERD (gastroesophageal reflux disease)   ? History of lung cancer   ? Stage III adenocarcinoma status post chemoradiation  ? Port-A-Cath in place 01/06/2019  ? Type 2 diabetes mellitus ([place])   ? ? ?Medications:  ?See med rec ? ?Assessment: ?with shortness of breath, she does have a history of lung cancer from several years ago, she went through chemo and radiation, notes that she has had some increasing swelling of her legs but now has a heaviness over her chest and a feeling like she gets severely short of breath with any exertion, has been going on for a couple of days, no fevers or chills, no nausea or vomiting.  CT scan shows  bilateral pulmonary embolism more on the right, there is considered to be a submassive pulmonary embolism with right heart strain with RV to LV ratio of 1.8.   Patient is not on oral anticoagulants . Pharmacy asked to manage heparin ? ?HL is 0.51, therapeutic. ? ?Goal of Therapy:   ?Heparin level 0.3-0.7 units/ml ?Monitor platelets by anticoagulation protocol: Yes ?  ?Plan:  ?Continue heparin infusion at 1500 units/hr ?Check anti-Xa level daily while on heparin ?Continue to monitor H&H and platelets ? ?Donna Christen Geovana Gebel, PharmD, MBA ?Clinical Pharmacist ? ?07/21/2021,7:23 AM ? ? ?

## 2021-07-22 DIAGNOSIS — I2609 Other pulmonary embolism with acute cor pulmonale: Secondary | ICD-10-CM | POA: Diagnosis not present

## 2021-07-22 LAB — CBC
HCT: 39.2 % (ref 36.0–46.0)
Hemoglobin: 11.6 g/dL — ABNORMAL LOW (ref 12.0–15.0)
MCH: 26.9 pg (ref 26.0–34.0)
MCHC: 29.6 g/dL — ABNORMAL LOW (ref 30.0–36.0)
MCV: 90.7 fL (ref 80.0–100.0)
Platelets: 311 10*3/uL (ref 150–400)
RBC: 4.32 MIL/uL (ref 3.87–5.11)
RDW: 16.1 % — ABNORMAL HIGH (ref 11.5–15.5)
WBC: 5.5 10*3/uL (ref 4.0–10.5)
nRBC: 0 % (ref 0.0–0.2)

## 2021-07-22 LAB — GLUCOSE, CAPILLARY
Glucose-Capillary: 106 mg/dL — ABNORMAL HIGH (ref 70–99)
Glucose-Capillary: 74 mg/dL (ref 70–99)

## 2021-07-22 LAB — HEPARIN LEVEL (UNFRACTIONATED): Heparin Unfractionated: 0.59 IU/mL (ref 0.30–0.70)

## 2021-07-22 MED ORDER — ENOXAPARIN SODIUM 120 MG/0.8ML IJ SOSY
120.0000 mg | PREFILLED_SYRINGE | Freq: Two times a day (BID) | INTRAMUSCULAR | Status: DC
  Administered 2021-07-22: 120 mg via SUBCUTANEOUS
  Filled 2021-07-22: qty 0.8

## 2021-07-22 MED ORDER — HEPARIN SOD (PORK) LOCK FLUSH 100 UNIT/ML IV SOLN
500.0000 [IU] | Freq: Once | INTRAVENOUS | Status: AC
  Administered 2021-07-22: 500 [IU] via INTRAVENOUS
  Filled 2021-07-22: qty 5

## 2021-07-22 MED ORDER — ENOXAPARIN SODIUM 120 MG/0.8ML IJ SOSY: 120.0000 mg | PREFILLED_SYRINGE | Freq: Two times a day (BID) | INTRAMUSCULAR | 2 refills | Status: DC

## 2021-07-22 NOTE — Progress Notes (Signed)
SATURATION QUALIFICATIONS: ?Patient Saturations on Room Air at Rest = 93% ? ?Patient Saturations on Room Air while Ambulating = 86% ? ?Patient Saturations on 2 Liters of oxygen while Ambulating = 95% ? ?[place] with walking. Patient walked about 150 feet. Patient was complaining of SOB with  dyspnea with exertion almost half way through. Patient took a short break and we returned to the room.  ?

## 2021-07-22 NOTE — Care Management Important Message (Signed)
Important Message ? ?Patient Details  ?Name: Denise [name] ?MRN: [id number] ?Date of Birth: [birth year] ? ? ?Medicare Important Message Given:  Yes ? ? ? ? ?Tommy Medal ?07/22/2021, 11:33 AM ?

## 2021-07-22 NOTE — TOC Transition Note (Signed)
Transition of Care (TOC) - CM/SW Discharge Note ? ? ?Patient Details  ?Name: Denise [name] ?MRN: [id number] ?Date of Birth: October 28, 1950 ? ?Transition of Care (TOC) CM/SW Contact:  ?Shade Flood, LCSW ?Phone Number: ?07/22/2021, 9:57 AM ? ? ?Clinical Narrative:    ? ?Pt stable for dc today per MD. Pt needs O2 and HH. Spoke with pt to review CMS Provider options. Referred to [facility] for O2 at pt request. They do not accept pt's insurance. Discussed with pt and referred to [place] at pt new request. [facility] referral given to [facility] with [facility] at pt request. Vaughan Basta has accepted. ? ?Pt can dc once portable O2 brought to room. There are no other TOC needs for dc. ? ?Final next level of care: [name] ?Barriers to Discharge: Barriers Resolved ? ? ?Patient Goals and CMS Choice ?Patient states their goals for this hospitalization and ongoing recovery are:: Home with HH ?CMS Medicare.gov Compare Post Acute Care list provided to:: Patient ?Choice offered to / list presented to : Patient ? ?Discharge Placement ?  ?           ?  ?  ?  ?  ? ?Discharge Plan and Services ?In-house Referral: Clinical Social Work ?  ?Post Acute Care Choice: Home Health          ?DME Arranged: Oxygen ?DME Agency: [employer] ?Date DME Agency Contacted: 07/22/21 ?  ?Representative spoke with at DME Agency: Caryl Pina ?HH Arranged: Therapist, sports, PT ?[place] Agency: [facility] ([facility] ?Date HH Agency Contacted: 07/22/21 ?  ?Representative spoke with at [place]: Vaughan Basta ? ?Social Determinants of Health (SDOH) Interventions ?  ? ? ?Readmission Risk Interventions ?   ? View : No data to display.  ?  ?  ?  ? ? ? ? ? ?

## 2021-07-22 NOTE — Discharge Summary (Addendum)
Physician Discharge Summary  ?Treasure Ochs BJY:[id number] DOB: 1950-10-06 DOA: 07/17/2021 ? ?PCP: Iona Beard, MD ? ?Admit date: 07/17/2021 ?Discharge date: 07/22/2021 ? ?Admitted From: Home ?Disposition:  Home ? ?Recommendations for Outpatient Follow-up:  ?Follow up with PCP in 1 week ?Follow up with Dr. Delton Coombes. "Suspected new areas of hepatic metastatic disease largest best seen on coronal image 82/5 measuring up to 2 cm in the RIGHT hepatic lobe. Consider MRI for further evaluation when the patient is able, preferably as an outpatient when they were able to cooperate for the examination and perform breath holding maneuvers." ?Repeat echocardiogram in 6 to 8 weeks ? ?Discharge Condition: Stable ?CODE STATUS: Full Code ?Diet recommendation:  ?Diet Orders (From admission, onward)  ? ?  Start     Ordered  ? 07/22/21 0000  Diet - low sodium heart healthy       ? 07/22/21 0943  ? 07/17/21 1937  Diet heart healthy/carb modified Room service appropriate? Yes; Fluid consistency: Thin  Diet effective now       ?Question Answer Comment  ?Diet-HS Snack? Nothing   ?Room service appropriate? Yes   ?Fluid consistency: Thin   ?  ? 07/17/21 1936  ? ?  ?  ? ?  ? ?Brief/Interim Summary: ?Denise [name] is a 71 y.o. female with medical history significant of lung cancer sp chemotherapy and radiation who presented with dyspnea. Reported 2 days of worsening dyspnea on exertion, to the point where she is symptomatic with minimal efforts, no chest pain, cough or hemoptysis.  ?  ?Patient walks with the help of a cane because right knee pain, able to do activities of daily living when at baseline. 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  ?  ?Work-up revealed acute PE with right heart strain.  Patient was started on IV heparin.  Pulmonology was consulted who recommended IV heparin for prolonged.,  5 days due to her right heart strain.  Patient completed IV heparin  therapy and transitioned to Lovenox. ? ?Discharge Diagnoses:  ? ?Principal Problem: ?  Pulmonary embolus ([place]) ?Active Problems: ?  Essential hypertension ?  Type 2 diabetes mellitus ([place]) ?  Aortic stenosis ?  Hypokalemia ?  History of lung cancer ?  Class 3 obesity (HCC) ? ? ?PE ?-With evidence of right heart strain, echocardiogram also confirmed right ventricular failure with pulmonary hypertension.  This was discussed with PCCM and they recommended to continue IV heparin for total 5 days (4/30 to 5/4).  She will need repeat echocardiogram in 6 to 8 weeks ?-Lower extremity Dopplers negative for DVT ?-Transition IV heparin to Lovenox for discharge ?-Patient qualifies for home oxygen, ordered ?  ?Hypertension ?-Resume home medications ?  ?Diabetes mellitus type 2 with neuropathy ?-Continue gabapentin ? ?History of lung cancer, adenocarcinoma status post chemo and radiation ?-Follow-up with oncology, Dr. Delton Coombes ? ?Obesity ?-Estimated body mass index is 47.46 kg/m? as calculated from the following: ?  Height as of this encounter: 5\' 2"  (1.575 m). ?  Weight as of this encounter: 117.7 kg. ? ?Discharge Instructions ? ?Discharge Instructions   ? ? Call MD for:   Complete by: As directed ?  ? Evidence of bleeding  ? Call MD for:  difficulty breathing, headache or visual disturbances   Complete by: As directed ?  ? Call MD for:  extreme fatigue   Complete by: As directed ?  ? Call MD for:  persistant dizziness or light-headedness   Complete by: As directed ?  ? Call MD for:  persistant nausea and vomiting   Complete by: As directed ?  ? Call MD for:  severe uncontrolled pain   Complete by: As directed ?  ? Call MD for:  temperature >100.4   Complete by: As directed ?  ? Diet - low sodium heart healthy   Complete by: As directed ?  ? Discharge instructions   Complete by: As directed ?  ? You were cared for by a hospitalist during your hospital stay. If you have any questions about your discharge medications or the care  you received while you were in the hospital after you are discharged, you can call the unit and ask to speak with the hospitalist on call if the hospitalist that took care of you is not available. Once you are discharged, your primary care physician will handle any further medical issues. Please note that NO REFILLS for any discharge medications will be authorized once you are discharged, as it is imperative that you return to your primary care physician (or establish a relationship with a primary care physician if you do not have one) for your aftercare needs so that they can reassess your need for medications and monitor your lab values.  ? Increase activity slowly   Complete by: As directed ?  ? ?  ? ?Allergies as of 07/22/2021   ?No Known Allergies ?  ? ?  ?Medication List  ?  ? ?STOP taking these medications   ? ?aspirin EC 81 MG tablet ?  ?diclofenac 75 MG EC tablet ?Commonly known as: VOLTAREN ?  ?DURVALUMAB IV ?  ?furosemide 20 MG tablet ?Commonly known as: LASIX ?  ?HYDROcodone-acetaminophen 5-325 MG tablet ?Commonly known as: NORCO/VICODIN ?  ?lidocaine-prilocaine cream ?Commonly known as: EMLA ?  ?naproxen 500 MG tablet ?Commonly known as: NAPROSYN ?  ?OPTIVE 0.5-0.9 % ophthalmic solution ?Generic drug: carboxymethylcellul-glycerin ?  ?TYLENOL 8 HOUR ARTHRITIS PAIN PO ?  ? ?  ? ?TAKE these medications   ? ?albuterol 108 (90 Base) MCG/ACT inhaler ?Commonly known as: VENTOLIN HFA ?TAKE 2 PUFFS BY MOUTH EVERY 6 HOURS AS NEEDED FOR WHEEZE OR SHORTNESS OF BREATH ?What changed: See the new instructions. ?  ?amLODipine 5 MG tablet ?Commonly known as: NORVASC ?Take 5 mg by mouth daily. ?  ?carvedilol 3.125 MG tablet ?Commonly known as: COREG ?Take 1 tablet (3.125 mg total) by mouth 2 (two) times daily. ?  ?diphenhydrAMINE 25 MG tablet ?Commonly known as: BENADRYL ?Take 25 mg by mouth every 6 (six) hours as needed for itching or allergies. ?  ?enoxaparin 120 MG/0.8ML injection ?Commonly known as: LOVENOX ?Inject 0.8  mLs (120 mg total) into the skin every 12 (twelve) hours. ?  ?ferrous sulfate 325 (65 FE) MG tablet ?Take 325 mg by mouth every evening. ?  ?gabapentin 300 MG capsule ?Commonly known as: NEURONTIN ?Take 300 mg by mouth at bedtime as needed (Nerve Pain). ?  ?multivitamin with minerals Tabs tablet ?Take 1 tablet by mouth daily. ?  ?omeprazole 20 MG capsule ?Commonly known as: PRILOSEC ?Take 20 mg by mouth daily. ?  ?TUMS PO ?Take 4 tablets by mouth daily. ?  ?Vitamin B-12 2500 MCG Subl ?Place 2,500 mcg under the tongue every morning. ?  ? ?  ? ?  ?  ? ? ?  ?Durable Medical Equipment  ?(From admission, onward)  ?  ? ? ?  ? ?  Start  Ordered  ? 07/22/21 0938  For home use only DME oxygen  Once       ?Question Answer Comment  ?Length of Need 6 Months   ?Mode or (Route) Nasal cannula   ?Liters per Minute 2   ?Frequency Continuous (stationary and portable oxygen unit needed)   ?Oxygen delivery system Gas   ?  ? 07/22/21 0937  ? ?  ?  ? ?  ? ? Follow-up Information   ? ? Iona Beard, MD Follow up.   ?Specialty: Family Medicine ?Contact information: ?[address] ?[address] ?[place] [address] ?[phone] ? ? ?  ?  ? ? Derek Jack, MD Follow up.   ?Specialty: Hematology ?Contact information: ?[address] ?[place] [address] ?[phone] ? ? ?  ?  ? ?  ?  ? ?  ? ?No Known Allergies ? ?Consultations: ?PCCM  ? ? ?Procedures/Studies: ?CT Chest W Contrast ? ?Result Date: 07/17/2021 ?CLINICAL DATA:  A 71 year old female presents with history of non-small cell lung cancer. Shortness of breath on exertion for the past few days. Insert body on EXAM: CT CHEST WITH CONTRAST TECHNIQUE: Multidetector CT imaging of the chest was performed during intravenous contrast administration. RADIATION DOSE REDUCTION: This exam was performed according to the departmental dose-optimization program which includes automated exposure control, adjustment of the mA and/or kV according to patient size and/or use of iterative reconstruction  technique. CONTRAST:  40mL OMNIPAQUE IOHEXOL 300 MG/ML  SOLN COMPARISON:  April 21, 2021. FINDINGS: Cardiovascular: Increased RV to LV ratio at 1.8.  No pericardial effusion. Aortic caliber is normal

## 2021-07-22 NOTE — Progress Notes (Signed)
ANTICOAGULATION CONSULT NOTE - ? ?Pharmacy Consult for Heparin ?Indication: pulmonary embolus ? ?No Known Allergies ? ?Patient Measurements: ?Height: 5\' 2"  (157.5 cm) ?Weight: 117.7 kg (259 lb 7.7 oz) ?IBW/kg (Calculated) : 50.1 ?HEPARIN DW (KG): 79.2  ? ?Vital Signs: ?Temp: 97.5 ?F (36.4 ?C) (05/05 0509) ?Temp Source: Oral (05/05 0509) ?BP: 153/129 (05/05 0509) ?Pulse Rate: 77 (05/05 0509) ? ?Labs: ?Recent Labs  ?  07/20/21 ?8550 07/21/21 ?1586 07/22/21 ?8257  ?HGB 11.9* 11.5* 11.6*  ?HCT 38.8 37.8 39.2  ?PLT 312 316 311  ?HEPARINUNFRC 0.53 0.51 0.59  ?CREATININE 0.86  --   --   ? ? ?Estimated Creatinine Clearance: 74.1 mL/min (by C-G formula based on SCr of 0.86 mg/dL). ? ?Assessment: ?71 year old female with hx of lung cancer s/p chemo presented with acute PE with right heart strain. Pharmacy asked to transition totreatment dose lovenox s/p 5 days of heparin therapy.  ? ?Goal of Therapy:  ?Monitor platelets by anticoagulation protocol: Yes ?  ?Plan:  ?Lovenox 1mg /kg (120mg ) q 12 hrs ?DC heparin drip and associated labs ?Monitor CBC and bleeding symptoms ? ?Lorenso Courier, PharmD ?Clinical Pharmacist ?07/22/2021 8:18 AM ? ? ? ?

## 2021-07-26 ENCOUNTER — Encounter (HOSPITAL_COMMUNITY): Payer: Medicare HMO

## 2021-07-26 DIAGNOSIS — R7303 Prediabetes: Secondary | ICD-10-CM | POA: Diagnosis not present

## 2021-07-26 DIAGNOSIS — D329 Benign neoplasm of meninges, unspecified: Secondary | ICD-10-CM | POA: Diagnosis not present

## 2021-07-26 DIAGNOSIS — M170 Bilateral primary osteoarthritis of knee: Secondary | ICD-10-CM | POA: Diagnosis not present

## 2021-07-26 DIAGNOSIS — C3412 Malignant neoplasm of upper lobe, left bronchus or lung: Secondary | ICD-10-CM | POA: Diagnosis not present

## 2021-07-26 DIAGNOSIS — I2699 Other pulmonary embolism without acute cor pulmonale: Secondary | ICD-10-CM | POA: Diagnosis not present

## 2021-07-26 DIAGNOSIS — I10 Essential (primary) hypertension: Secondary | ICD-10-CM | POA: Diagnosis not present

## 2021-07-26 DIAGNOSIS — Z6841 Body Mass Index (BMI) 40.0 and over, adult: Secondary | ICD-10-CM | POA: Diagnosis not present

## 2021-07-26 DIAGNOSIS — I2510 Atherosclerotic heart disease of native coronary artery without angina pectoris: Secondary | ICD-10-CM | POA: Diagnosis not present

## 2021-08-03 ENCOUNTER — Emergency Department (HOSPITAL_COMMUNITY): Payer: Medicare HMO

## 2021-08-03 ENCOUNTER — Encounter (HOSPITAL_COMMUNITY): Payer: Self-pay

## 2021-08-03 ENCOUNTER — Other Ambulatory Visit: Payer: Self-pay

## 2021-08-03 ENCOUNTER — Inpatient Hospital Stay (HOSPITAL_COMMUNITY)
Admission: EM | Admit: 2021-08-03 | Discharge: 2021-08-09 | DRG: 813 | Disposition: A | Discharge status: Home Health | Payer: Medicare HMO | Attending: Family Medicine | Admitting: Family Medicine

## 2021-08-03 DIAGNOSIS — Z87891 Personal history of nicotine dependence: Secondary | ICD-10-CM | POA: Diagnosis not present

## 2021-08-03 DIAGNOSIS — Z96612 Presence of left artificial shoulder joint: Secondary | ICD-10-CM | POA: Diagnosis present

## 2021-08-03 DIAGNOSIS — I2510 Atherosclerotic heart disease of native coronary artery without angina pectoris: Secondary | ICD-10-CM | POA: Diagnosis present

## 2021-08-03 DIAGNOSIS — I10 Essential (primary) hypertension: Secondary | ICD-10-CM | POA: Diagnosis present

## 2021-08-03 DIAGNOSIS — E119 Type 2 diabetes mellitus without complications: Secondary | ICD-10-CM

## 2021-08-03 DIAGNOSIS — I2609 Other pulmonary embolism with acute cor pulmonale: Secondary | ICD-10-CM | POA: Diagnosis not present

## 2021-08-03 DIAGNOSIS — Z79899 Other long term (current) drug therapy: Secondary | ICD-10-CM | POA: Diagnosis not present

## 2021-08-03 DIAGNOSIS — Z8261 Family history of arthritis: Secondary | ICD-10-CM | POA: Diagnosis not present

## 2021-08-03 DIAGNOSIS — R011 Cardiac murmur, unspecified: Secondary | ICD-10-CM | POA: Diagnosis present

## 2021-08-03 DIAGNOSIS — K5730 Diverticulosis of large intestine without perforation or abscess without bleeding: Secondary | ICD-10-CM | POA: Diagnosis not present

## 2021-08-03 DIAGNOSIS — C7931 Secondary malignant neoplasm of brain: Secondary | ICD-10-CM | POA: Diagnosis not present

## 2021-08-03 DIAGNOSIS — Z833 Family history of diabetes mellitus: Secondary | ICD-10-CM

## 2021-08-03 DIAGNOSIS — R1032 Left lower quadrant pain: Secondary | ICD-10-CM | POA: Diagnosis not present

## 2021-08-03 DIAGNOSIS — C3492 Malignant neoplasm of unspecified part of left bronchus or lung: Secondary | ICD-10-CM | POA: Diagnosis present

## 2021-08-03 DIAGNOSIS — C227 Other specified carcinomas of liver: Secondary | ICD-10-CM | POA: Diagnosis not present

## 2021-08-03 DIAGNOSIS — T45515A Adverse effect of anticoagulants, initial encounter: Secondary | ICD-10-CM | POA: Diagnosis present

## 2021-08-03 DIAGNOSIS — S3662XA Contusion of rectum, initial encounter: Secondary | ICD-10-CM | POA: Diagnosis not present

## 2021-08-03 DIAGNOSIS — Z8249 Family history of ischemic heart disease and other diseases of the circulatory system: Secondary | ICD-10-CM

## 2021-08-03 DIAGNOSIS — I350 Nonrheumatic aortic (valve) stenosis: Secondary | ICD-10-CM

## 2021-08-03 DIAGNOSIS — K219 Gastro-esophageal reflux disease without esophagitis: Secondary | ICD-10-CM | POA: Diagnosis not present

## 2021-08-03 DIAGNOSIS — R160 Hepatomegaly, not elsewhere classified: Secondary | ICD-10-CM | POA: Diagnosis not present

## 2021-08-03 DIAGNOSIS — M7981 Nontraumatic hematoma of soft tissue: Secondary | ICD-10-CM | POA: Diagnosis not present

## 2021-08-03 DIAGNOSIS — K769 Liver disease, unspecified: Secondary | ICD-10-CM | POA: Diagnosis not present

## 2021-08-03 DIAGNOSIS — D6832 Hemorrhagic disorder due to extrinsic circulating anticoagulants: Principal | ICD-10-CM | POA: Diagnosis present

## 2021-08-03 DIAGNOSIS — Z803 Family history of malignant neoplasm of breast: Secondary | ICD-10-CM | POA: Diagnosis not present

## 2021-08-03 DIAGNOSIS — Z7901 Long term (current) use of anticoagulants: Secondary | ICD-10-CM

## 2021-08-03 DIAGNOSIS — T148XXA Other injury of unspecified body region, initial encounter: Secondary | ICD-10-CM | POA: Diagnosis not present

## 2021-08-03 DIAGNOSIS — E1159 Type 2 diabetes mellitus with other circulatory complications: Secondary | ICD-10-CM

## 2021-08-03 DIAGNOSIS — Z6841 Body Mass Index (BMI) 40.0 and over, adult: Secondary | ICD-10-CM | POA: Diagnosis not present

## 2021-08-03 DIAGNOSIS — Z825 Family history of asthma and other chronic lower respiratory diseases: Secondary | ICD-10-CM

## 2021-08-03 DIAGNOSIS — Z923 Personal history of irradiation: Secondary | ICD-10-CM

## 2021-08-03 DIAGNOSIS — I2699 Other pulmonary embolism without acute cor pulmonale: Secondary | ICD-10-CM | POA: Diagnosis present

## 2021-08-03 DIAGNOSIS — Z96641 Presence of right artificial hip joint: Secondary | ICD-10-CM | POA: Diagnosis present

## 2021-08-03 DIAGNOSIS — C229 Malignant neoplasm of liver, not specified as primary or secondary: Secondary | ICD-10-CM | POA: Diagnosis not present

## 2021-08-03 DIAGNOSIS — Z9884 Bariatric surgery status: Secondary | ICD-10-CM

## 2021-08-03 DIAGNOSIS — X58XXXA Exposure to other specified factors, initial encounter: Secondary | ICD-10-CM | POA: Diagnosis present

## 2021-08-03 DIAGNOSIS — S301XXA Contusion of abdominal wall, initial encounter: Secondary | ICD-10-CM | POA: Diagnosis present

## 2021-08-03 DIAGNOSIS — Z8505 Personal history of malignant neoplasm of liver: Secondary | ICD-10-CM | POA: Diagnosis not present

## 2021-08-03 DIAGNOSIS — C3490 Malignant neoplasm of unspecified part of unspecified bronchus or lung: Secondary | ICD-10-CM | POA: Diagnosis not present

## 2021-08-03 DIAGNOSIS — Z85118 Personal history of other malignant neoplasm of bronchus and lung: Secondary | ICD-10-CM

## 2021-08-03 DIAGNOSIS — C787 Secondary malignant neoplasm of liver and intrahepatic bile duct: Secondary | ICD-10-CM | POA: Diagnosis not present

## 2021-08-03 DIAGNOSIS — K429 Umbilical hernia without obstruction or gangrene: Secondary | ICD-10-CM | POA: Diagnosis present

## 2021-08-03 DIAGNOSIS — Z95828 Presence of other vascular implants and grafts: Secondary | ICD-10-CM

## 2021-08-03 DIAGNOSIS — Z9221 Personal history of antineoplastic chemotherapy: Secondary | ICD-10-CM

## 2021-08-03 DIAGNOSIS — Z808 Family history of malignant neoplasm of other organs or systems: Secondary | ICD-10-CM | POA: Diagnosis not present

## 2021-08-03 LAB — CBC WITH DIFFERENTIAL/PLATELET
Abs Immature Granulocytes: 0.01 10*3/uL (ref 0.00–0.07)
Basophils Absolute: 0 10*3/uL (ref 0.0–0.1)
Basophils Relative: 0 %
Eosinophils Absolute: 0.1 10*3/uL (ref 0.0–0.5)
Eosinophils Relative: 1 %
HCT: 43.3 % (ref 36.0–46.0)
Hemoglobin: 13.2 g/dL (ref 12.0–15.0)
Immature Granulocytes: 0 %
Lymphocytes Relative: 16 %
Lymphs Abs: 1.2 10*3/uL (ref 0.7–4.0)
MCH: 27.2 pg (ref 26.0–34.0)
MCHC: 30.5 g/dL (ref 30.0–36.0)
MCV: 89.3 fL (ref 80.0–100.0)
Monocytes Absolute: 0.5 10*3/uL (ref 0.1–1.0)
Monocytes Relative: 7 %
Neutro Abs: 5.8 10*3/uL (ref 1.7–7.7)
Neutrophils Relative %: 76 %
Platelets: 299 10*3/uL (ref 150–400)
RBC: 4.85 MIL/uL (ref 3.87–5.11)
RDW: 15.7 % — ABNORMAL HIGH (ref 11.5–15.5)
WBC: 7.6 10*3/uL (ref 4.0–10.5)
nRBC: 0 % (ref 0.0–0.2)

## 2021-08-03 LAB — URINALYSIS, ROUTINE W REFLEX MICROSCOPIC
Bilirubin Urine: NEGATIVE
Glucose, UA: NEGATIVE mg/dL
Hgb urine dipstick: NEGATIVE
Ketones, ur: NEGATIVE mg/dL
Leukocytes,Ua: NEGATIVE
Nitrite: POSITIVE — AB
Protein, ur: NEGATIVE mg/dL
Specific Gravity, Urine: 1.036 — ABNORMAL HIGH (ref 1.005–1.030)
pH: 6 (ref 5.0–8.0)

## 2021-08-03 LAB — COMPREHENSIVE METABOLIC PANEL
ALT: 21 U/L (ref 0–44)
AST: 17 U/L (ref 15–41)
Albumin: 3.9 g/dL (ref 3.5–5.0)
Alkaline Phosphatase: 83 U/L (ref 38–126)
Anion gap: 10 (ref 5–15)
BUN: 8 mg/dL (ref 8–23)
CO2: 27 mmol/L (ref 22–32)
Calcium: 9.5 mg/dL (ref 8.9–10.3)
Chloride: 107 mmol/L (ref 98–111)
Creatinine, Ser: 0.83 mg/dL (ref 0.44–1.00)
GFR, Estimated: >60 mL/min (ref >60)
Glucose, Bld: 123 mg/dL — ABNORMAL HIGH (ref 70–99)
Potassium: 4.5 mmol/L (ref 3.5–5.1)
Sodium: 144 mmol/L (ref 135–145)
Total Bilirubin: 0.3 mg/dL (ref 0.3–1.2)
Total Protein: 7.3 g/dL (ref 6.5–8.1)

## 2021-08-03 LAB — LIPASE, BLOOD: Lipase: 23 U/L (ref 11–51)

## 2021-08-03 LAB — HEMOGLOBIN AND HEMATOCRIT, BLOOD
HCT: 41.3 % (ref 36.0–46.0)
Hemoglobin: 13 g/dL (ref 12.0–15.0)

## 2021-08-03 IMAGING — CT CT ABD-PELV W/ CM
2 of 5 series · 15 of 46 positions shown, 17 images · IV contrast (Omnipaque or Isovue)
Comparison: [DATE] [DATE], [DATE].  [DATE] [DATE], [DATE] chest CT.

CLINICAL DATA: A 71-year-old female presents for evaluation of LEFT
lower quadrant abdominal pain. Pain radiates to the groin and began
yesterday.

* Tracking Code: BO * w
EXAM:
CT ABDOMEN AND PELVIS WITH CONTRAST
TECHNIQUE: Multidetector CT imaging of the abdomen and pelvis was performed
using the standard protocol following bolus administration of
intravenous contrast.

[Series 2: axial st · axial · 0.88mm/px · z∈[+972,+1402]mm · 12 of 96 slices shown, 14 images]
[im 5/96  soft-tissue]
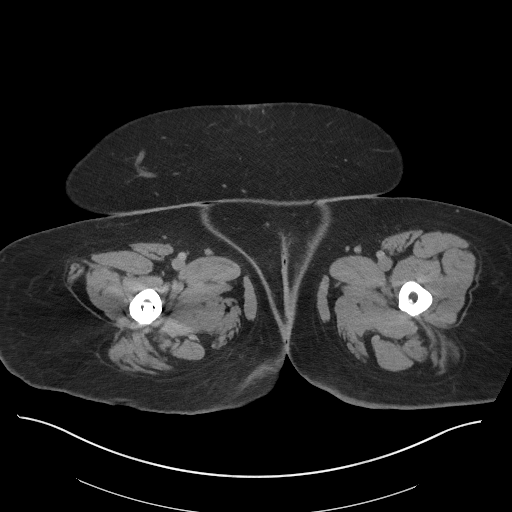
[im 5/96  bone]
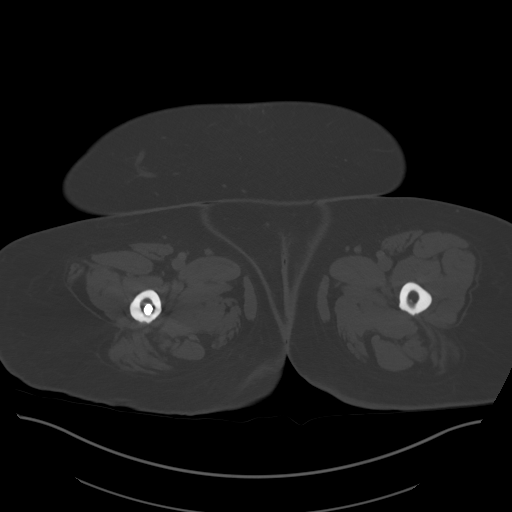
[im 15/96  soft-tissue]
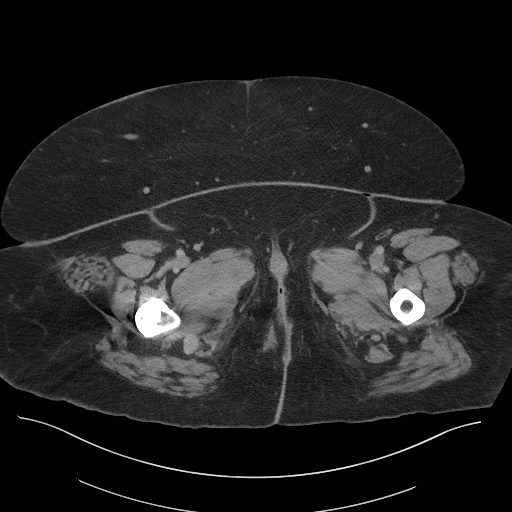
[im 20/96  soft-tissue]
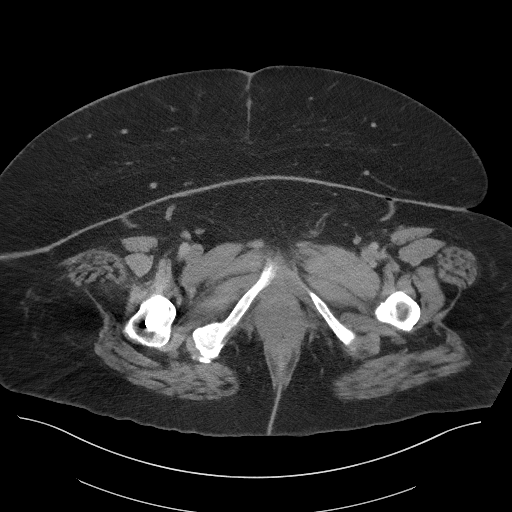
[im 29/96  soft-tissue]
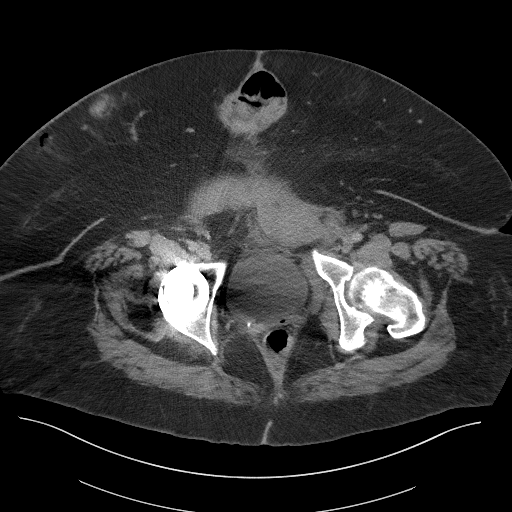
[im 39/96  soft-tissue]
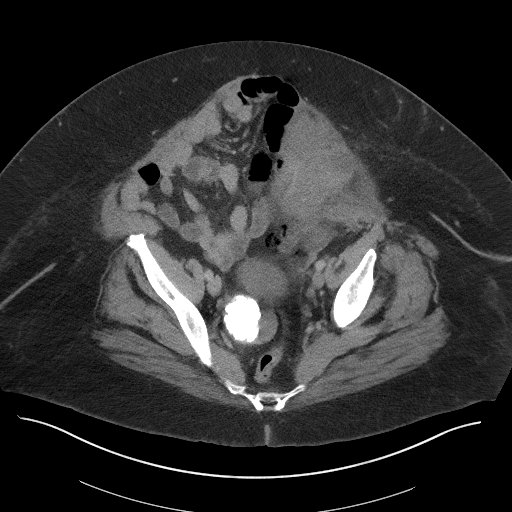
[im 43/96  soft-tissue]
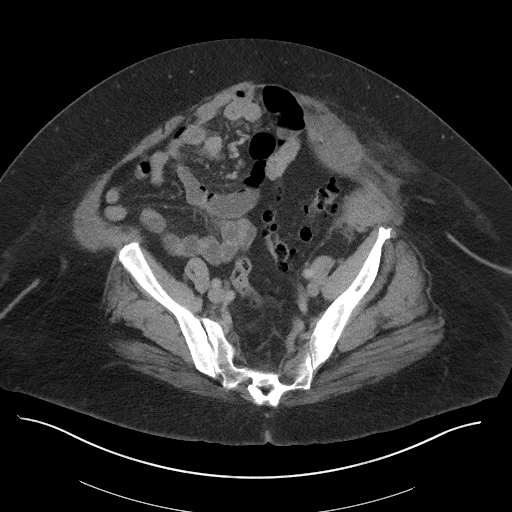
[im 53/96  soft-tissue]
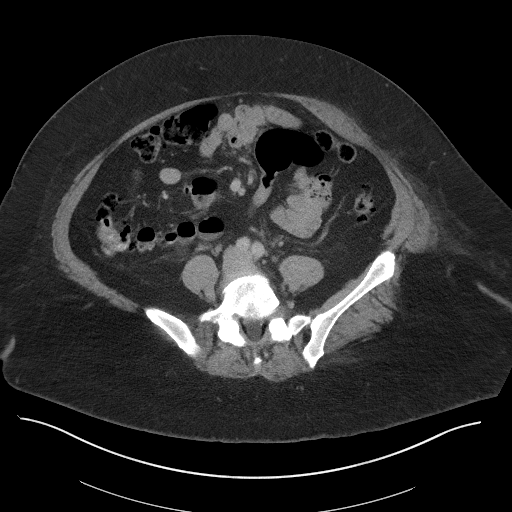
[im 58/96  soft-tissue]
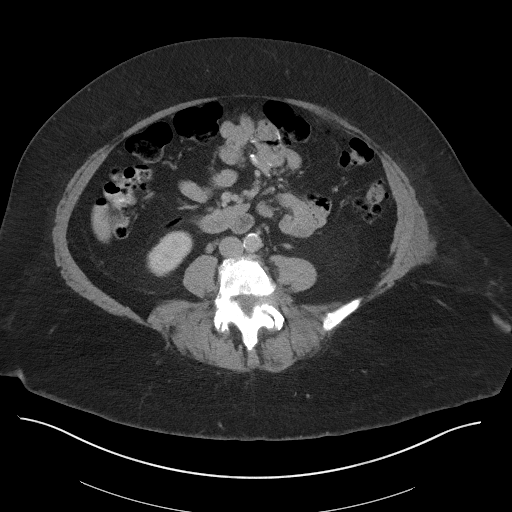
[im 67/96  soft-tissue]
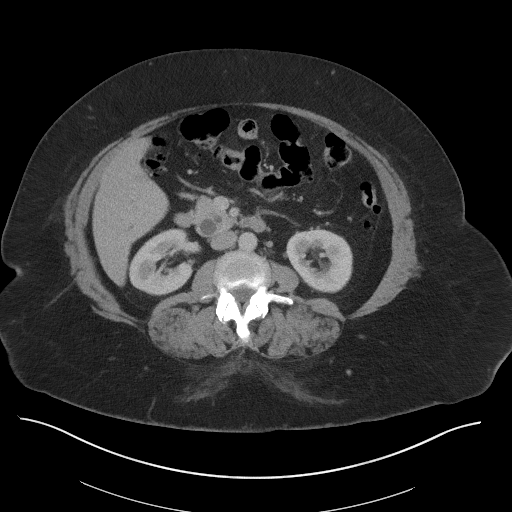
[im 67/96  bone]
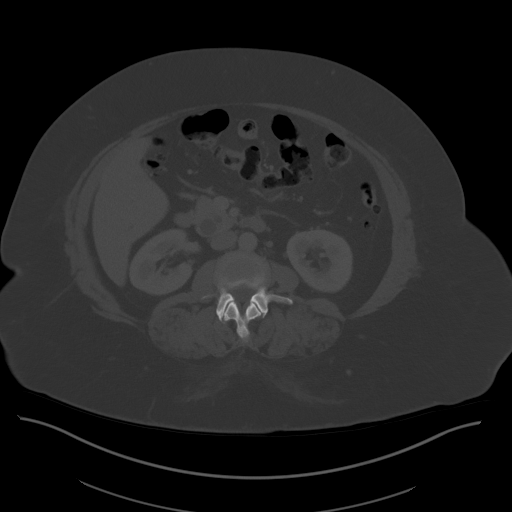
[im 77/96  soft-tissue]
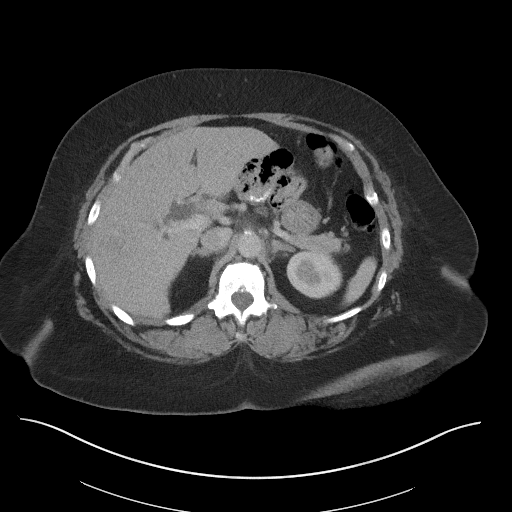
[im 81/96  soft-tissue]
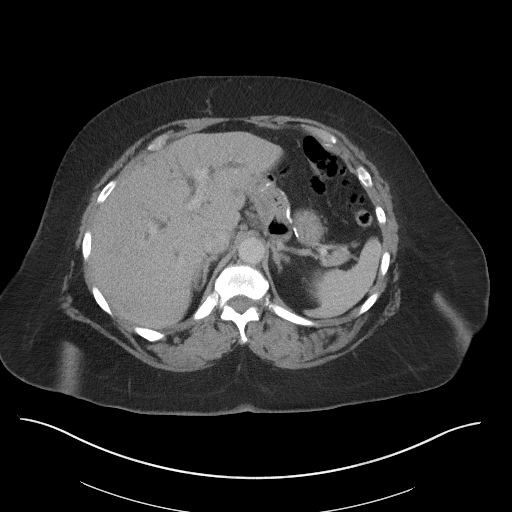
[im 91/96  soft-tissue]
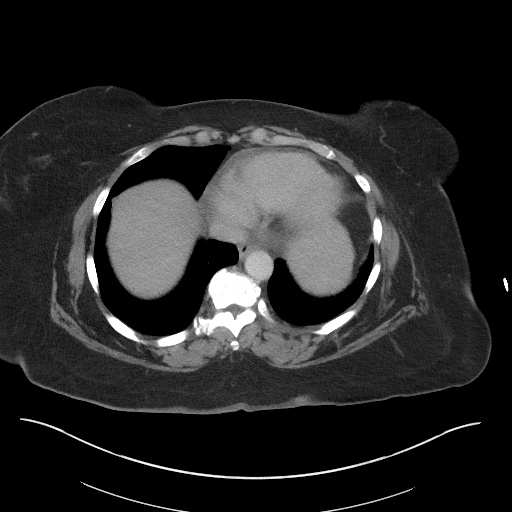

[Series 5: coronal st · coronal · 0.83mm/px · 3 of 127 slices shown]
[im 43/127  soft-tissue]
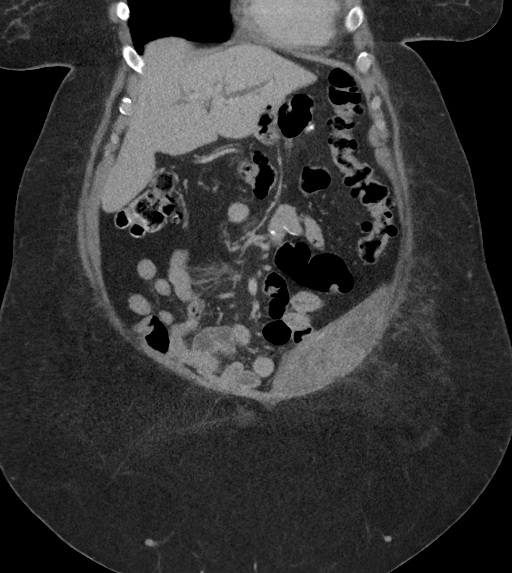
[im 57/127  soft-tissue]
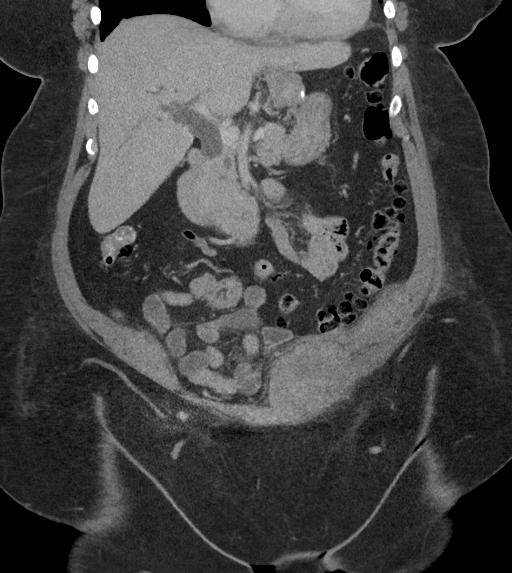
[im 71/127  soft-tissue]
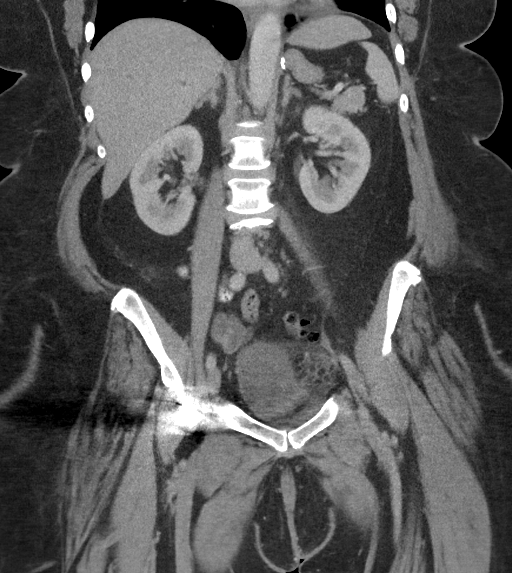

[15 of 46 positions shown; findings below may reference images not displayed]

RADIATION DOSE REDUCTION: This exam was performed according to the
departmental dose-optimization program which includes automated
exposure control, adjustment of the mA and/or kV according to
patient size and/or use of iterative reconstruction technique.

CONTRAST:  100mL OMNIPAQUE IOHEXOL 300 MG/ML  SOLN
FINDINGS: Lower chest: Lung bases are clear aside from mild atelectatic
changes.

Hepatobiliary: Marked dilation of the biliary tree 16 mm caliber of
the common bile duct increased from 12-13 mm in [DATE].
Intrahepatic biliary duct distension also present.

Lesion as large as 3.7 x 3.4 cm in the RIGHT hepatic lobe inferior
to a 17 mm lesion in hepatic subsegment V no additional hepatic
lesions./VIII (image [DATE])

Pancreas: Normal, without mass, inflammation or ductal dilatation.

Spleen: Normal.

Adrenals/Urinary Tract: Adrenal glands are normal.

Symmetric renal enhancement without hydronephrosis. No perivesical
stranding. No suspicious renal lesion.

Stomach/Bowel: Post gastric bypass. No small bowel obstruction.
Small bowel loops contained in a ventral hernia with wide mouth. The
appendix is normal. There is extensive colonic diverticulosis
without acute colonic process.

Vascular/Lymphatic: No adenopathy in the retroperitoneum. No
adenopathy in the pelvis.

Reproductive: Fibroid uterus densely calcified fibroid.

Other: Large rectus sheath hematoma extending to the symphysis pubis
on the LEFT compatible with intramuscular hematoma in the setting of
ongoing anticoagulation. Expands the lower margin of the muscle. No
discrete site of extravasation but with mixed attenuation, a finding
that can be seen in the setting of ongoing bleeding. Hematoma
measuring in the sagittal plane approximately 12.4 x 4.8 cm. Does
not currently across the midline. Stranding about the hematoma.

Musculoskeletal: No acute bone finding. No destructive bone process.
Spinal degenerative changes. Signs of RIGHT hip arthroplasty with
resultant streak artifact.
IMPRESSION: 1. Large LEFT rectus sheath hematoma extending to the symphysis
pubis on the LEFT compatible with intramuscular hematoma. No
discrete site of extravasation but with mixed attenuation, a finding
that can be seen in the setting of ongoing bleeding. Hematoma
measuring 12 cm greatest dimension.
2. Suspected new hepatic metastatic disease and potential lesion as
large as 3.7 cm in the RIGHT hepatic lobe. Follow-up liver MRI with
and without Eovist contrast may be helpful. At this time increasing
biliary duct distension could also be evaluated. This would be best
performed as an outpatient when the patient is better able to hold
their breath and cooperate with this examination, at outside of the
acute setting.
3. Post gastric bypass.
4. Nonobstructing large umbilical hernia containing small bowel
loops.

Findings of large rectus sheath hematoma were called to the
physician caring for the patient as outlined below.

These results were called by telephone at the time of interpretation
on [DATE] at [DATE] to provider Dr. WELLINGTON CESARIO, Who verbally
acknowledged these results.

## 2021-08-03 MED ORDER — MORPHINE SULFATE (PF) 4 MG/ML IV SOLN: 4.0000 mg | Freq: Once | INTRAVENOUS | Status: DC

## 2021-08-03 MED ORDER — ACETAMINOPHEN 325 MG PO TABS
650.0000 mg | ORAL_TABLET | Freq: Four times a day (QID) | ORAL | Status: DC | PRN
  Administered 2021-08-05: 650 mg via ORAL
  Filled 2021-08-03: qty 2

## 2021-08-03 MED ORDER — PANTOPRAZOLE SODIUM 40 MG PO TBEC
40.0000 mg | DELAYED_RELEASE_TABLET | Freq: Every day | ORAL | Status: DC
  Administered 2021-08-04 – 2021-08-09 (×6): 40 mg via ORAL
  Filled 2021-08-03 (×6): qty 1

## 2021-08-03 MED ORDER — ACETAMINOPHEN 650 MG RE SUPP: 650.0000 mg | Freq: Four times a day (QID) | RECTAL | Status: DC | PRN

## 2021-08-03 MED ORDER — POLYETHYLENE GLYCOL 3350 17 G PO PACK: 17.0000 g | PACK | Freq: Every day | ORAL | Status: DC | PRN

## 2021-08-03 MED ORDER — SODIUM CHLORIDE 0.9 % IV SOLN: INTRAVENOUS | Status: AC

## 2021-08-03 MED ORDER — IOHEXOL 300 MG/ML  SOLN
100.0000 mL | Freq: Once | INTRAMUSCULAR | Status: AC | PRN
  Administered 2021-08-03: 100 mL via INTRAVENOUS

## 2021-08-03 MED ORDER — MORPHINE SULFATE (PF) 4 MG/ML IV SOLN
4.0000 mg | Freq: Once | INTRAVENOUS | Status: AC
  Administered 2021-08-03: 4 mg via INTRAVENOUS
  Filled 2021-08-03: qty 1

## 2021-08-03 MED ORDER — MORPHINE SULFATE (PF) 4 MG/ML IV SOLN
INTRAVENOUS | Status: AC
  Administered 2021-08-03: 4 mg via INTRAVENOUS
  Filled 2021-08-03: qty 1

## 2021-08-03 MED ORDER — HYDROCODONE-ACETAMINOPHEN 5-325 MG PO TABS
1.0000 | ORAL_TABLET | Freq: Three times a day (TID) | ORAL | Status: DC | PRN
  Administered 2021-08-03 – 2021-08-05 (×5): 1 via ORAL
  Filled 2021-08-03 (×5): qty 1

## 2021-08-03 MED ORDER — PROMETHAZINE HCL 25 MG PO TABS: 12.5000 mg | ORAL_TABLET | Freq: Four times a day (QID) | ORAL | Status: DC | PRN

## 2021-08-03 NOTE — ED Notes (Signed)
Back from CT. EDPA at [facility].  ?

## 2021-08-03 NOTE — Assessment & Plan Note (Addendum)
Blood pressure soft this admission. Resume home Coreg. Hold home amlodipine. Daily blood pressure checks.

## 2021-08-03 NOTE — Assessment & Plan Note (Addendum)
Diagnosed 4/30.  CT chest with contrast with at least submassive PE, with right heart strain - acute occlusive PE at the segmental level, nonocclusive at the lobar level in the right lower lobe with emboli also in the upper and middle lobe on the right.  She required home O2 on discharge.  PE in the setting of lung cancer. Patient discharged on Eliquis.

## 2021-08-03 NOTE — Assessment & Plan Note (Addendum)
Cardiac murmur present.  Recent echo 07/2021 shows moderate aortic valve stenosis.

## 2021-08-03 NOTE — ED Notes (Signed)
Pt in CT.

## 2021-08-03 NOTE — Plan of Care (Signed)

## 2021-08-03 NOTE — Assessment & Plan Note (Addendum)
Follows with Dr. Delton Coombes.  Status post chemotherapy, immunotherapy. CT abdomen/pelvis significant for concern of hepatic metastatic disease. IR consulted for biopsy which was performed on 5/22. Biopsy pending on discharge.

## 2021-08-03 NOTE — ED Notes (Signed)
[facility] here for transport.  

## 2021-08-03 NOTE — H&P (Signed)
?History and Physical  ? ? ?Denise [name] FTD:[id number] DOB: 03-04-1951 DOA: 08/03/2021 ? ?PCP: Iona Beard, MD  ? ?Patient coming from: Home ? ?I have personally briefly reviewed patient's old medical records in [place] ? ?Chief Complaint: Abdominal pain ? ?HPI: Denise [name] is a 71 y.o. female with medical history significant for pulmonary embolism, diabetes mellitus, hypertension, aortic stenosis, small bowel obstruction, left lung cancer with brain metastasis. ?Patient presented to the ED with complaints of left lower quadrant pain of 1 day duration. ? ?Recently hospitalized 4/30 to 5/5-with pulmonary bolusing with evidence of right heart strain confirmed on echocardiogram.  Patient was treated with IV heparin for 5 days, with plan to repeat echo in 6 to 8 weeks.  Bilateral lower extremity Dopplers was negative for DVT.  She was discharged home on Lovenox. ? ?From a pulmonary embolism standpoint her breathing has improved, she was discharged on home O2 which she uses as needed.  She denies any trauma to her abdomen or any unusual exercises involving her abdomen.  She worked with PT, mostly moving her legs, upper lower extremity, sitting and standing up.  No dizziness.  No chest pains. ? ?ED Course: Heart rate 79-103.  Blood pressure systolic 062-376.  O2 sats 91 to 95% on room air placed on 2 L nasal cannula. ?Hemoglobin stable at 13.2. ?Abdominal/pelvic CT with contrast-large left rectus sheath hematoma extending to the symphysis pubis on the left compatible with intramuscular hematoma, also concerning for ongoing bleeding.  Hematoma measuring 12 cm in its greatest dimension.  Metastatic liver disease also noted, with follow-up MRI recommended as outpatient. ?EDP talked to general surgeon recommended medicine admission, abdominal binder and trend hemoglobin. ? ?Review of Systems: As per HPI all other systems reviewed and negative. ? ?Past Medical History:  ?Diagnosis Date  ? Anemia   ? Aortic stenosis   ?  Arthritis   ? Cancer [facility])   ? Lung  ? Coronary artery calcification seen on CT scan   ? Essential hypertension   ? GERD (gastroesophageal reflux disease)   ? History of lung cancer   ? Stage III adenocarcinoma status post chemoradiation  ? Port-A-Cath in place 01/06/2019  ? Type 2 diabetes mellitus ([place])   ? ? ?Past Surgical History:  ?Procedure Laterality Date  ? CHOLECYSTECTOMY  1997  ? COLONOSCOPY    ? GASTRIC BYPASS    ? INCISIONAL HERNIA REPAIR  04/11/11  ? IR IMAGING GUIDED PORT INSERTION  12/27/2018  ? Right  ? LAPAROSCOPIC SALPINGOOPHERECTOMY    ? LAPAROTOMY  04/11/2011  ? Procedure: EXPLORATORY LAPAROTOMY;  Surgeon: Joyice Faster. Cornett, MD;  Location: [facility];  Service: General;  Laterality: N/A;  closure port hole  ? RIGHT/LEFT HEART CATH AND CORONARY ANGIOGRAPHY N/A 12/29/2019  ? Procedure: RIGHT/LEFT HEART CATH AND CORONARY ANGIOGRAPHY;  Surgeon: Troy Sine, MD;  Location: [facility];  Service: Cardiovascular;  Laterality: N/A;  ? TOTAL HIP ARTHROPLASTY  03/07/2012  ? Procedure: TOTAL HIP ARTHROPLASTY ANTERIOR APPROACH;  Surgeon: Mauri Pole, MD;  Location: [facility];  Service: Orthopedics;  Laterality: Right;  ? TOTAL SHOULDER ARTHROPLASTY Left 01/21/2015  ? TOTAL SHOULDER ARTHROPLASTY Left 01/21/2015  ? Procedure: LEFT TOTAL SHOULDER ARTHROPLASTY;  Surgeon: Justice Britain, MD;  Location: [facility];  Service: Orthopedics;  Laterality: Left;  ? VAGINAL HYSTERECTOMY    ? ? ? reports that she quit smoking about 45 years ago. Her smoking use included cigarettes. She has a 12.00 pack-year smoking history. She  has never used smokeless tobacco. She reports that she does not drink alcohol and does not use drugs. ? ?No Known Allergies ? ?Family History  ?Problem Relation Age of Onset  ? Breast cancer Mother   ? COPD Mother   ? Arthritis Mother   ? Diabetes Mother   ? Hypertension Mother   ? Hypertension Father   ? Diabetes Father   ? Breast cancer Sister   ? Thyroid cancer Brother   ? Huntington's disease  Maternal Grandmother   ? Heart attack Brother   ? ?Prior to Admission medications   ?Medication Sig Start Date End Date Taking? Authorizing Provider  ?albuterol (VENTOLIN HFA) 108 (90 Base) MCG/ACT inhaler TAKE 2 PUFFS BY MOUTH EVERY 6 HOURS AS NEEDED FOR WHEEZE OR SHORTNESS OF BREATH ?Patient taking differently: Inhale 2 puffs into the lungs every 6 (six) hours as needed for wheezing or shortness of breath. 07/27/20  Yes Derek Jack, MD  ?amLODipine (NORVASC) 5 MG tablet Take 5 mg by mouth daily.    Yes [provider]  ?carvedilol (COREG) 3.125 MG tablet Take 1 tablet (3.125 mg total) by mouth 2 (two) times daily. 07/08/21  Yes Satira Sark, MD  ?Cyanocobalamin (VITAMIN B-12) 2500 MCG SUBL Place 2,500 mcg under the tongue every morning.    Yes [provider]  ?diphenhydrAMINE (BENADRYL) 25 MG tablet Take 25 mg by mouth every 6 (six) hours as needed for itching or allergies.   Yes [provider]  ?enoxaparin (LOVENOX) 120 MG/0.8ML injection Inject 0.8 mLs (120 mg total) into the skin every 12 (twelve) hours. 07/22/21 10/20/21 Yes Dessa Phi, DO  ?ferrous sulfate 325 (65 FE) MG tablet Take 325 mg by mouth every evening.   Yes [provider]  ?furosemide (LASIX) 20 MG tablet Take 20 mg by mouth daily.   Yes [provider]  ?gabapentin (NEURONTIN) 300 MG capsule Take 300 mg by mouth at bedtime as needed (Nerve Pain).   Yes [provider]  ?HYDROcodone-acetaminophen (NORCO/VICODIN) 5-325 MG tablet Take 1 tablet by mouth every 8 (eight) hours as needed. for pain 07/26/21  Yes [provider]  ?Multiple Vitamin (MULITIVITAMIN WITH MINERALS) TABS Take 1 tablet by mouth daily.   Yes [provider]  ?omeprazole (PRILOSEC) 20 MG capsule Take 20 mg by mouth daily. 09/28/18  Yes [provider]  ?Potassium Chloride ER 20 MEQ TBCR Take 1 tablet by mouth daily. 07/26/21  Yes [provider]  ?Calcium Carbonate Antacid (TUMS  PO) Take 4 tablets by mouth daily.    [provider]  ?prochlorperazine (COMPAZINE) 10 MG tablet Take 1 tablet (10 mg total) by mouth every 6 (six) hours as needed (Nausea or vomiting). 01/06/19 05/29/19  Derek Jack, MD  ? ? ?Physical Exam: ?Vitals:  ? 08/03/21 1817 08/03/21 1830 08/03/21 1853 08/03/21 1942  ?BP:  118/82  (!) 101/45  ?Pulse: 100   94  ?Resp:    18  ?Temp:   97.9 ?F (36.6 ?C) 98.3 ?F (36.8 ?C)  ?TempSrc:   Oral Oral  ?SpO2: 95%   95%  ?Weight:      ?Height:      ? ? ?Constitutional: NAD, calm, comfortable ?Vitals:  ? 08/03/21 1817 08/03/21 1830 08/03/21 1853 08/03/21 1942  ?BP:  118/82  (!) 101/45  ?Pulse: 100   94  ?Resp:    18  ?Temp:   97.9 ?F (36.6 ?C) 98.3 ?F (36.8 ?C)  ?TempSrc:   Oral Oral  ?SpO2: 95%  95%  ?Weight:      ?Height:      ? ?Eyes: PERRL, lids and conjunctivae normal ?ENMT: Mucous membranes are moist.   ?Neck: normal, supple, no masses, no thyromegaly ?Respiratory: clear to auscultation bilaterally, no wheezing, no crackles. Normal respiratory effort. No accessory muscle use.  ?Cardiovascular: Regular rate and rhythm, known 3/6 systolic  murmurs / no rubs / gallops. Some woody induration to bilateral lower extremities, no pitting.  Port right upper chest no erythema, no tenderness, no fluctuance. ?Abdomen: Abdominal binder present, tenderness to left abdomen,  ?Musculoskeletal: no clubbing / cyanosis. No joint deformity upper and lower extremities. ?Skin: no rashes, lesions, ulcers. No induration ?Neurologic: No apparent cranial nerve abnormality moving extremities spontaneously ?Psychiatric: Normal judgment and insight. Alert and oriented x 3. Normal mood.  ? ?Labs on Admission: I have personally reviewed following labs and imaging studies ? ?CBC: ?Recent Labs  ?Lab 08/03/21 ?5501  ?WBC 7.6  ?NEUTROABS 5.8  ?HGB 13.2  ?HCT 43.3  ?MCV 89.3  ?PLT 299  ? ?Basic Metabolic Panel: ?Recent Labs  ?Lab 08/03/21 ?5868  ?NA 144  ?K 4.5  ?CL 107  ?CO2 27  ?GLUCOSE 123*   ?BUN 8  ?CREATININE 0.83  ?CALCIUM 9.5  ? ?GFR: ?Estimated Creatinine Clearance: 75.8 mL/min (by C-G formula based on SCr of 0.83 mg/dL). ?Liver Function Tests: ?Recent Labs  ?Lab 08/03/21 ?2574  ?AST 17  ?ALT

## 2021-08-03 NOTE — ED Notes (Signed)
abd binder applied, is in place. "Feels somewhat better", tolerating well. ?

## 2021-08-03 NOTE — Assessment & Plan Note (Addendum)
CT evidence of a large left rectus sheath hematoma extending to the symphysis pubis on the left compatible with intramuscular hematoma, with mixed attenuation suggesting ongoing bleeding. Outpatient Lovenox was held on admission and transitioned to Heparin IV. Patient's hemoglobin remained stable while on heparin. Patient transitioned to Eliquis PO on discharge per her oncologist's recommendations.

## 2021-08-03 NOTE — ED Provider Triage Note (Signed)
Emergency Medicine Provider Triage Evaluation Note ? ?Denise [name] , a 71 y.o. female  was evaluated in triage.  Pt complains of LLQ abdominal pain.  She states that same came on gradually yesterday and is much worse today.  She states that and hurts all the time but is exacerbated by movement.  It same also radiates into her left groin.  She does state that she started doing physical therapy on Monday, however denies any known injuries associated with this and states that her pain does not feel musculoskeletal in nature.  She denies any nausea, vomiting, or diarrhea.  Denies any dysuria or hematuria.  Endorses history of gastric bypass and hysterectomy.  States that her pain is currently 10/10. ? ?Review of Systems  ?Positive: Abdominal pain ?Negative: N/V/D ? ?Physical Exam  ?BP 122/79 (BP Location: Right Arm)   Pulse 86   Temp 98.6 ?F (37 ?C)   Resp 18   Ht 5\' 2"  (1.575 m)   Wt 115.2 kg   SpO2 91%   BMI 46.46 kg/m?  ?Gen:   Awake, no distress   ?Resp:  Normal effort  ?MSK:   Moves extremities without difficulty  ?Other:  Tenderness to palpation along the left lower quadrant of the abdomen. ? ?Medical Decision Making  ?Medically screening exam initiated at 1:50 PM.  Appropriate orders placed.  Lynnetta Tom was informed that the remainder of the evaluation will be completed by another provider, this initial triage assessment does not replace that evaluation, and the importance of remaining in the ED until their evaluation is complete. ? ?Work-up initiated ?  ?Bud Face, PA-C ?08/03/21 1352 ? ?

## 2021-08-03 NOTE — ED Provider Notes (Signed)
[facility] Provider Note   CSN: [id number] Arrival date & time: 08/03/21  1209     History  Chief Complaint  Patient presents with   Abdominal Pain    Denise [name] is a 71 y.o. female.  Patient with history of PE, lung cancer, HTN, diabetes presents today with complaints of LLQ abdominal pain. She states that same came on gradually yesterday and is much worse today.  She states that and hurts all the time but is exacerbated by any movement.  She states that same also radiates into her left groin. Of note, patient was just discharged from the hospital on 5/5 for pulmonary embolism with heart strain. On Lovenox shots for same. She does state that she started doing home physical therapy on Monday, however denies any known injuries associated with this and states that her pain does not feel musculoskeletal in nature.  She denies any nausea, vomiting, or diarrhea.  Denies any dysuria or hematuria.  Endorses history of gastric bypass and hysterectomy.  States that her pain is currently 10/10.  The history is provided by the patient. No language interpreter was used.  Abdominal Pain Associated symptoms: no chest pain, no chills, no diarrhea, no fever, no nausea, no shortness of breath and no vomiting       Home Medications Prior to Admission medications   Medication Sig Start Date End Date Taking? Authorizing Provider  albuterol (VENTOLIN HFA) 108 (90 Base) MCG/ACT inhaler TAKE 2 PUFFS BY MOUTH EVERY 6 HOURS AS NEEDED FOR WHEEZE OR SHORTNESS OF BREATH Patient taking differently: Inhale 2 puffs into the lungs every 6 (six) hours as needed for wheezing or shortness of breath. 07/27/20   Derek Jack, MD  amLODipine (NORVASC) 5 MG tablet Take 5 mg by mouth daily.     [provider]  Calcium Carbonate Antacid (TUMS PO) Take 4 tablets by mouth daily.    [provider]  carvedilol (COREG) 3.125 MG tablet Take 1 tablet (3.125 mg total) by mouth 2  (two) times daily. 07/08/21   Satira Sark, MD  Cyanocobalamin (VITAMIN B-12) 2500 MCG SUBL Place 2,500 mcg under the tongue every morning.     [provider]  diphenhydrAMINE (BENADRYL) 25 MG tablet Take 25 mg by mouth every 6 (six) hours as needed for itching or allergies.    [provider]  enoxaparin (LOVENOX) 120 MG/0.8ML injection Inject 0.8 mLs (120 mg total) into the skin every 12 (twelve) hours. 07/22/21 10/20/21  Dessa Phi, DO  ferrous sulfate 325 (65 FE) MG tablet Take 325 mg by mouth every evening.    [provider]  gabapentin (NEURONTIN) 300 MG capsule Take 300 mg by mouth at bedtime as needed (Nerve Pain).    [provider]  Multiple Vitamin (MULITIVITAMIN WITH MINERALS) TABS Take 1 tablet by mouth daily.    [provider]  omeprazole (PRILOSEC) 20 MG capsule Take 20 mg by mouth daily. 09/28/18   [provider]  prochlorperazine (COMPAZINE) 10 MG tablet Take 1 tablet (10 mg total) by mouth every 6 (six) hours as needed (Nausea or vomiting). 01/06/19 05/29/19  Derek Jack, MD      Allergies    Patient has no known allergies.    Review of Systems   Review of Systems  Constitutional:  Negative for chills and fever.  Respiratory:  Negative for shortness of breath.   Cardiovascular:  Negative for chest pain.  Gastrointestinal:  Positive for abdominal pain. Negative for diarrhea,  nausea and vomiting.  All other systems reviewed and are negative.  Physical Exam Updated Vital Signs BP 122/79 (BP Location: Right Arm)   Pulse 86   Temp 98.6 F (37 C)   Resp 18   Ht 5\' 2"  (1.575 m)   Wt 115.2 kg   SpO2 91%   BMI 46.46 kg/m  Physical Exam Vitals and nursing note reviewed.  Constitutional:      General: She is not in acute distress.    Appearance: Normal appearance. She is normal weight. She is not ill-appearing, toxic-appearing or diaphoretic.  HENT:     Head: Normocephalic and atraumatic.   Cardiovascular:     Rate and Rhythm: Normal rate.  Pulmonary:     Effort: Pulmonary effort is normal. No respiratory distress.  Abdominal:     General: Abdomen is flat.     Palpations: Abdomen is soft.     Tenderness: There is abdominal tenderness in the left lower quadrant.     Comments: No overlying abdominal skin changes  Musculoskeletal:        General: Normal range of motion.     Cervical back: Normal range of motion.  Skin:    General: Skin is warm and dry.  Neurological:     General: No focal deficit present.     Mental Status: She is alert.  Psychiatric:        Mood and Affect: Mood normal.        Behavior: Behavior normal.    ED Results / Procedures / Treatments   Labs (all labs ordered are listed, but only abnormal results are displayed) Labs Reviewed  COMPREHENSIVE METABOLIC PANEL - Abnormal; Notable for the following components:      Result Value   Glucose, Bld 123 (*)    All other components within normal limits  CBC WITH DIFFERENTIAL/PLATELET - Abnormal; Notable for the following components:   RDW 15.7 (*)    All other components within normal limits  LIPASE, BLOOD  URINALYSIS, ROUTINE W REFLEX MICROSCOPIC    EKG None  Radiology No results found.  Procedures Procedures    Medications Ordered in ED Medications - No data to display  ED Course/ Medical Decision Making/ A&P                           Medical Decision Making Amount and/or Complexity of Data Reviewed Labs: ordered. Radiology: ordered.  Risk Prescription drug management.   This patient presents to the ED for concern of abdominal pain, this involves an extensive number of treatment options, and is a complaint that carries with it a high risk of complications and morbidity.    Co morbidities that complicate the patient evaluation  Hx CAD, htn, PE, lung cancer, morbid obesity   Additional history obtained:  Additional history obtained from previous admission notes at the  beginning of May   Lab Tests:  I Ordered, and personally interpreted labs.  The pertinent results include:  no acute laboratory findings   Imaging Studies ordered:  I ordered imaging studies including CT abdomen pelvis with contrast  I independently visualized and interpreted imaging which showed  1. Large LEFT rectus sheath hematoma extending to the symphysis pubis on the LEFT compatible with intramuscular hematoma. No discrete site of extravasation but with mixed attenuation, a finding that can be seen in the setting of ongoing bleeding. Hematoma measuring 12 cm greatest dimension. 2. Suspected new hepatic metastatic disease and potential lesion as  large as 3.7 cm in the RIGHT hepatic lobe. Follow-up liver MRI with and without Eovist contrast may be helpful. At this time increasing biliary duct distension could also be evaluated. This would be best performed as an outpatient when the patient is better able to hold their breath and cooperate with this examination, at outside of the acute setting. I agree with the radiologist interpretation   Consultations Obtained:  I requested consultation with the general surgery Dr Constance Haw,  and discussed lab and imaging findings as well as pertinent plan - they recommend: inpatient admission to medicine with trending hemoglobins and abdominal binder   Problem List / ED Course / Critical interventions / Medication management  I ordered medication including morphine  for pain  Reevaluation of the patient after these medicines showed that the patient improved I have reviewed the patients home medicines and have made adjustments as needed  Patient presents today with complaints of LLQ abdominal pain. Patient recently started Lovenox injections BID following admission for PE at the beginning of May. CT imaging shows large rectus sheath hematoma with no evidence of extravasation. Patient is HAD with hemoglobin 13.2. Discussed patient with surgery who  states this is not a surgical case. Will seek admission for hemoglobin monitoring. Patient is understanding and amenable with plan.  Discussed patient with hospitalist who agrees to admit.   This is a shared visit with supervising physician Dr. Sabra Heck who has independently evaluated patient & provided guidance in evaluation/management/disposition, in agreement with care    Final Clinical Impression(s) / ED Diagnoses Final diagnoses:  Hematoma of rectus sheath, initial encounter  Left lower quadrant abdominal pain    Rx / DC Orders ED Discharge Orders     None         Nestor Lewandowsky 08/03/21 1743    Noemi Chapel, MD 08/04/21 1055

## 2021-08-03 NOTE — ED Notes (Signed)
Report called to [facility] °

## 2021-08-03 NOTE — ED Triage Notes (Signed)
Pt c/o LLQ pain radiating into L groin x1 day.  Pain score 10/10 w/ movement.  Denies n/v/d and GU symptoms.  Pt reports she started new exercises last week w/ home health.  ? ?Sts pain w/ walking and coughing.  ?

## 2021-08-03 NOTE — Assessment & Plan Note (Addendum)
Controlled. Hemoglobin A1C 5.9%.

## 2021-08-04 DIAGNOSIS — T148XXA Other injury of unspecified body region, initial encounter: Secondary | ICD-10-CM | POA: Diagnosis not present

## 2021-08-04 LAB — BASIC METABOLIC PANEL
Anion gap: 10 (ref 5–15)
BUN: 7 mg/dL — ABNORMAL LOW (ref 8–23)
CO2: 23 mmol/L (ref 22–32)
Calcium: 8.8 mg/dL — ABNORMAL LOW (ref 8.9–10.3)
Chloride: 107 mmol/L (ref 98–111)
Creatinine, Ser: 0.85 mg/dL (ref 0.44–1.00)
GFR, Estimated: >60 mL/min (ref >60)
Glucose, Bld: 131 mg/dL — ABNORMAL HIGH (ref 70–99)
Potassium: 5.3 mmol/L — ABNORMAL HIGH (ref 3.5–5.1)
Sodium: 140 mmol/L (ref 135–145)

## 2021-08-04 LAB — GLUCOSE, CAPILLARY: Glucose-Capillary: 116 mg/dL — ABNORMAL HIGH (ref 70–99)

## 2021-08-04 LAB — CBC
HCT: 37.1 % (ref 36.0–46.0)
Hemoglobin: 11.9 g/dL — ABNORMAL LOW (ref 12.0–15.0)
MCH: 27.9 pg (ref 26.0–34.0)
MCHC: 32.1 g/dL (ref 30.0–36.0)
MCV: 86.9 fL (ref 80.0–100.0)
Platelets: 294 10*3/uL (ref 150–400)
RBC: 4.27 MIL/uL (ref 3.87–5.11)
RDW: 15.4 % (ref 11.5–15.5)
WBC: 7.6 10*3/uL (ref 4.0–10.5)
nRBC: 0 % (ref 0.0–0.2)

## 2021-08-04 LAB — HEPARIN LEVEL (UNFRACTIONATED): Heparin Unfractionated: 0.41 IU/mL (ref 0.30–0.70)

## 2021-08-04 MED ORDER — HEPARIN (PORCINE) 25000 UT/250ML-% IV SOLN
1450.0000 [IU]/h | INTRAVENOUS | Status: DC
  Administered 2021-08-04: 1400 [IU]/h via INTRAVENOUS
  Administered 2021-08-05 – 2021-08-06 (×2): 1100 [IU]/h via INTRAVENOUS
  Administered 2021-08-07 (×2): 1450 [IU]/h via INTRAVENOUS
  Filled 2021-08-04 (×5): qty 250

## 2021-08-04 MED ORDER — CHLORHEXIDINE GLUCONATE CLOTH 2 % EX PADS
6.0000 | MEDICATED_PAD | Freq: Every day | CUTANEOUS | Status: DC
  Administered 2021-08-04 – 2021-08-09 (×6): 6 via TOPICAL

## 2021-08-04 MED ORDER — SODIUM ZIRCONIUM CYCLOSILICATE 10 G PO PACK
10.0000 g | PACK | Freq: Once | ORAL | Status: AC
  Administered 2021-08-04: 10 g via ORAL
  Filled 2021-08-04: qty 1

## 2021-08-04 NOTE — Progress Notes (Addendum)
ANTICOAGULATION CONSULT NOTE - Initial Consult  Pharmacy Consult for Heparin Indication: h/o  recently diagnosed  pulmonary embolus  No Known Allergies  Patient Measurements: Height: 5\' 2"  (157.5 cm) Weight: 118.3 kg (260 lb 12.9 oz) IBW/kg (Calculated) : 50.1 Heparin Dosing Weight: 79.3 kg  Vital Signs: Temp: 98.9 F (37.2 C) (05/18 0928) Temp Source: Oral (05/18 0928) BP: 116/76 (05/18 0928) Pulse Rate: 100 (05/18 0928)  Labs: Recent Labs    08/03/21 1404 08/03/21 2144 08/04/21 0303  HGB 13.2 13.0 11.9*  HCT 43.3 41.3 37.1  PLT 299  --  294  CREATININE 0.83  --  0.85    Estimated Creatinine Clearance: 75.3 mL/min (by C-G formula based on SCr of 0.85 mg/dL).   Medical History: Past Medical History:  Diagnosis Date   Anemia    Aortic stenosis    Arthritis    Cancer ([place])    Lung   Coronary artery calcification seen on CT scan    Essential hypertension    GERD (gastroesophageal reflux disease)    History of lung cancer    Stage III adenocarcinoma status post chemoradiation   Port-A-Cath in place 01/06/2019   Type 2 diabetes mellitus ([facility])     Medications:  Facility-Administered Medications Prior to Admission  Medication Dose Route Frequency Provider Last Rate Last Admin   sodium chloride flush (NS) 0.9 % injection 3 mL  3 mL Intravenous Q12H Satira Sark, MD       Medications Prior to Admission  Medication Sig Dispense Refill Last Dose   albuterol (VENTOLIN HFA) 108 (90 Base) MCG/ACT inhaler TAKE 2 PUFFS BY MOUTH EVERY 6 HOURS AS NEEDED FOR WHEEZE OR SHORTNESS OF BREATH (Patient taking differently: Inhale 2 puffs into the lungs every 6 (six) hours as needed for wheezing or shortness of breath.) 18 each 6 08/03/2021   amLODipine (NORVASC) 5 MG tablet Take 5 mg by mouth daily.    08/03/2021   carvedilol (COREG) 3.125 MG tablet Take 1 tablet (3.125 mg total) by mouth 2 (two) times daily. 180 tablet 0 08/03/2021 at 1000   Cyanocobalamin (VITAMIN B-12) 2500  MCG SUBL Place 2,500 mcg under the tongue every morning.    08/03/2021   diphenhydrAMINE (BENADRYL) 25 MG tablet Take 25 mg by mouth every 6 (six) hours as needed for itching or allergies.   unknown   enoxaparin (LOVENOX) 120 MG/0.8ML injection Inject 0.8 mLs (120 mg total) into the skin every 12 (twelve) hours. 48 mL 2 08/03/2021 at 0900   ferrous sulfate 325 (65 FE) MG tablet Take 325 mg by mouth every evening.   08/02/2021   furosemide (LASIX) 20 MG tablet Take 20 mg by mouth daily.   08/03/2021   gabapentin (NEURONTIN) 300 MG capsule Take 300 mg by mouth at bedtime as needed (Nerve Pain).   08/02/2021   HYDROcodone-acetaminophen (NORCO/VICODIN) 5-325 MG tablet Take 1 tablet by mouth every 8 (eight) hours as needed. for pain   08/03/2021   Multiple Vitamin (MULITIVITAMIN WITH MINERALS) TABS Take 1 tablet by mouth daily.   08/03/2021   omeprazole (PRILOSEC) 20 MG capsule Take 20 mg by mouth daily.   08/03/2021   Potassium Chloride ER 20 MEQ TBCR Take 1 tablet by mouth daily.   08/03/2021   Calcium Carbonate Antacid (TUMS PO) Take 4 tablets by mouth daily.       Assessment: 71 y.o female with lung cancer with brain mets. admitted on 08/03/21 with complaints of left lower quadrant pain of 1 day  duration.  CT abdomen/pelvis showed a large left rectus sheath hematoma extending to the symphysis pubis on the left compatible with intramuscular hematoma, a She was on Lovenox treatment dose 120 mg SQ q12h prior to admit for recenty diagnosed pulmonary embolism on previous hospitalization 4/30 to 5/523.  Discharged on Lovenox;  noted that oral anticoagulant was not chosen because of her having active cancer as the PE    This admission:  08/03/21:  CT abdomen/pelvis showed a large left rectus sheath hematoma extending to the symphysis pubis on the left compatible with intramuscular hematoma.  Previous admission:  -4/30, CT PA showed at least submassive PE on the right side.  Negative for DVT in lower  extremitiescause of PE.  Lovenox last taken 08/03/21 @ 0900     Goal of Therapy:  Heparin level 0.3-0.7 units/ml Monitor platelets by anticoagulation protocol: Yes   Plan:  Heparin infusion 1100 units/hr. (No bolus per MD's orders).  Chose reduced rate ~ 14 ut/kg/hr due to + large left rectus sheath hematoma.  Check 8 hour heparin level Daily HL and CBC Monitor for s/sx of bleeding  Nicole Cella, RPh Clinical Pharmacist [phone] 08/04/2021,3:26 PM

## 2021-08-04 NOTE — Progress Notes (Signed)
ANTICOAGULATION CONSULT NOTE - Initial Consult  Pharmacy Consult for Heparin Indication: h/o  recently diagnosed  pulmonary embolus  No Known Allergies  Patient Measurements: Height: 5\' 2"  (157.5 cm) Weight: 116.7 kg (257 lb 4.4 oz) IBW/kg (Calculated) : 50.1 Heparin Dosing Weight: 79.3 kg  Vital Signs: Temp: 100 F (37.8 C) (05/18 2059) Temp Source: Oral (05/18 2059) BP: 116/69 (05/18 2059) Pulse Rate: 99 (05/18 2059)  Labs: Recent Labs    08/03/21 1404 08/03/21 2144 08/04/21 0303 08/04/21 2218  HGB 13.2 13.0 11.9*  --   HCT 43.3 41.3 37.1  --   PLT 299  --  294  --   HEPARINUNFRC  --   --   --  0.41  CREATININE 0.83  --  0.85  --      Estimated Creatinine Clearance: 74.6 mL/min (by C-G formula based on SCr of 0.85 mg/dL).   Medical History: Past Medical History:  Diagnosis Date   Anemia    Aortic stenosis    Arthritis    Cancer ([place])    Lung   Coronary artery calcification seen on CT scan    Essential hypertension    GERD (gastroesophageal reflux disease)    History of lung cancer    Stage III adenocarcinoma status post chemoradiation   Port-A-Cath in place 01/06/2019   Type 2 diabetes mellitus (HCC)     Medications:  Facility-Administered Medications Prior to Admission  Medication Dose Route Frequency Provider Last Rate Last Admin   sodium chloride flush (NS) 0.9 % injection 3 mL  3 mL Intravenous Q12H Satira Sark, MD       Medications Prior to Admission  Medication Sig Dispense Refill Last Dose   albuterol (VENTOLIN HFA) 108 (90 Base) MCG/ACT inhaler TAKE 2 PUFFS BY MOUTH EVERY 6 HOURS AS NEEDED FOR WHEEZE OR SHORTNESS OF BREATH (Patient taking differently: Inhale 2 puffs into the lungs every 6 (six) hours as needed for wheezing or shortness of breath.) 18 each 6 08/03/2021   amLODipine (NORVASC) 5 MG tablet Take 5 mg by mouth daily.    08/03/2021   carvedilol (COREG) 3.125 MG tablet Take 1 tablet (3.125 mg total) by mouth 2 (two) times daily.  180 tablet 0 08/03/2021 at 1000   Cyanocobalamin (VITAMIN B-12) 2500 MCG SUBL Place 2,500 mcg under the tongue every morning.    08/03/2021   diphenhydrAMINE (BENADRYL) 25 MG tablet Take 25 mg by mouth every 6 (six) hours as needed for itching or allergies.   unknown   enoxaparin (LOVENOX) 120 MG/0.8ML injection Inject 0.8 mLs (120 mg total) into the skin every 12 (twelve) hours. 48 mL 2 08/03/2021 at 0900   ferrous sulfate 325 (65 FE) MG tablet Take 325 mg by mouth every evening.   08/02/2021   furosemide (LASIX) 20 MG tablet Take 20 mg by mouth daily.   08/03/2021   gabapentin (NEURONTIN) 300 MG capsule Take 300 mg by mouth at bedtime as needed (Nerve Pain).   08/02/2021   HYDROcodone-acetaminophen (NORCO/VICODIN) 5-325 MG tablet Take 1 tablet by mouth every 8 (eight) hours as needed. for pain   08/03/2021   Multiple Vitamin (MULITIVITAMIN WITH MINERALS) TABS Take 1 tablet by mouth daily.   08/03/2021   omeprazole (PRILOSEC) 20 MG capsule Take 20 mg by mouth daily.   08/03/2021   Potassium Chloride ER 20 MEQ TBCR Take 1 tablet by mouth daily.   08/03/2021   Calcium Carbonate Antacid (TUMS PO) Take 4 tablets by mouth daily.  Assessment: 71 y.o female with lung cancer with brain mets. admitted on 08/03/21 with complaints of left lower quadrant pain of 1 day duration.  CT abdomen/pelvis showed a large left rectus sheath hematoma extending to the symphysis pubis on the left compatible with intramuscular hematoma. She was on Lovenox treatment dose 120 mg SQ q12h prior to admit for recenty diagnosed pulmonary embolism on previous hospitalization 4/30 to 5/523.  Discharged on Lovenox;  noted that oral anticoagulant was not chosen because of her having active cancer as the PE    This admission:  08/03/21:  CT abdomen/pelvis showed a large left rectus sheath hematoma extending to the symphysis pubis on the left compatible with intramuscular hematoma.  Previous admission:  -4/30, CT PA showed at least  submassive PE on the right side.  Negative for DVT in lower extremitiescause of PE.  Lovenox last taken 08/03/21 @ 0900   PM update: heparin level of 0.41 is therapeutic on heparin 1100 units/hr. No issues IV infusion or access. No bleeding noted per RN.   Goal of Therapy:  Heparin level 0.3-0.7 units/ml Monitor platelets by anticoagulation protocol: Yes   Plan:  Continue heparin 1100 units/hr  Follow up AM heparin level.  Daily HL and CBC Monitor for s/sx of bleeding  Cristela Felt, PharmD, BCPS Clinical Pharmacist 08/04/2021 10:44 PM

## 2021-08-04 NOTE — TOC Initial Note (Addendum)
Transition of Care [facility]) - Initial/Assessment Note    Patient Details  Name: Denise [name] MRN: [id number] Date of Birth: Jul 17, 1950  [facility]) CM/SW Contact:    Tom-Johnson, Renea Ee, RN Phone Number: 08/04/2021, 4:03 PM  Clinical Narrative:                  CM spoke with patient at bedside abut needs for post hospital transition. Admitted for Intramuscular Hematoma. Recent admit and went home on Lovenox. Has Hx of Lung Ca.  States her 32 yr old mother lives with her. Does not have any children. Has four supportive siblings and several nieces and nephews.  Independent with care and drive self prior to admission. Has a cane and home O2 with [employer].  Currently active with Home Health PT/RN services by [facility].  PCP is Denise Beard, MD [facility] [place].  Awaiting PT/OT eval for disposition. CM will continue to follow with needs.     Barriers to Discharge: Continued Medical Work up   Patient Goals and CMS Choice Patient states their goals for this hospitalization and ongoing recovery are:: To return home. CMS Medicare.gov Compare Post Acute Care list provided to:: Patient    Expected Discharge Plan and Services     Discharge Planning Services: CM Consult   Living arrangements for the past 2 months: Single Family Home                                      Prior Living Arrangements/Services Living arrangements for the past 2 months: Single Family Home Lives with:: Parents (59 yr old mother lives with patient.) Patient language and need for interpreter reviewed:: Yes Do you feel safe going back to the place where you live?: Yes      Need for Family Participation in Patient Care: Yes (Comment) Care giver support system in place?: Yes (comment) Current home services: DME Kasandra Knudsen) Criminal Activity/Legal Involvement Pertinent to Current Situation/Hospitalization: No - Comment as needed  Activities of Daily Living Home Assistive  Devices/Equipment: Cane (specify quad or straight) ADL Screening (condition at time of admission) Patient's cognitive ability adequate to safely complete daily activities?: Yes Is the patient deaf or have difficulty hearing?: No Does the patient have difficulty seeing, even when wearing glasses/contacts?: No Does the patient have difficulty concentrating, remembering, or making decisions?: No Patient able to express need for assistance with ADLs?: Yes Does the patient have difficulty dressing or bathing?: No Independently performs ADLs?: Yes (appropriate for developmental age) Walks in Home: Independent Does the patient have difficulty walking or climbing stairs?: No Weakness of Legs: None Weakness of Arms/Hands: None  Permission Sought/Granted Permission sought to share information with : Case Manager, Family Supports Permission granted to share information with : Yes, Verbal Permission Granted              Emotional Assessment Appearance:: Appears stated age Attitude/Demeanor/Rapport: Engaged, Gracious Affect (typically observed): Accepting, Appropriate, Calm, Hopeful Orientation: : Oriented to Self, Oriented to Place, Oriented to  Time, Oriented to Situation Alcohol / Substance Use: Not Applicable Psych Involvement: No (comment)  Admission diagnosis:  Intramuscular hematoma [T14.8XXA] Hematoma of rectus sheath, initial encounter [S30.1XXA] Left lower quadrant abdominal pain [R10.32] Patient Active Problem List   Diagnosis Date Noted   Intramuscular hematoma 08/03/2021   Pulmonary embolus ([place]) 07/17/2021   Type 2 diabetes mellitus ([place])    Essential hypertension  History of lung cancer    Class 3 obesity (HCC)    Hypokalemia    Malignant neoplasm metastatic to brain ([place]) 06/04/2020   Meningioma, cerebral ([place]) 05/18/2020   Abnormal myocardial perfusion study    Aortic stenosis    DOE (dyspnea on exertion) 10/09/2019   Meningioma ([place]) 03/20/2019   Port-A-Cath in  place 01/06/2019   Adenocarcinoma of left lung ([place]) 12/09/2018   S/P shoulder replacement 01/21/2015   Expected blood loss anemia 03/08/2012   Morbid obesity with BMI of 45.0-49.9, adult ([place]) 03/08/2012   S/P right THA, AA 03/07/2012   Small bowel obstruction ([place]) 04/11/2011   Incisional hernia with obstruction 04/11/2011   Pelvic mass 03/29/2011   Radicular low back pain 11/03/2010   Hip arthritis 11/03/2010   PCP:  Denise Beard, MD [facility]acy[id number] [place] [address] [place] [address] Phone: [phone] Fax: [phone]     Social Determinants of Health (SDOH) Interventions    Readmission Risk Interventions     View : No data to display.

## 2021-08-04 NOTE — Progress Notes (Addendum)
PROGRESS NOTE  Denise [name]  DOB: 07-20-1950  PCP: Iona Beard, MD HUD:[id number]  DOA: 08/03/2021  LOS: 1 day  Hospital Day: 2  Brief narrative: Denise [name] is a 71 y.o. female with PMH significant for recent diagnosis of pulm embolism, lung cancer with brain mets, DM 2, HTN, aortic stenosis, small bowel obstruction. Patient presented to the ED on 5/17 with complaint of left lower quadrant pain for 1 day. She was recently hospitalized 4/30 to 5/5 with pulmonary embolism, treated with IV heparin and discharged home on Lovenox.  Bilateral lower EXTR duplex negative for DVT.  Oral anticoagulant was not chosen because of her having active cancer as the cause of PE.  Hemodynamically stable. Hemoglobin was 13.2 CT abdomen/pelvis showed a large left rectus sheath hematoma extending to the symphysis pubis on the left compatible with intramuscular hematoma, also concerning for ongoing bleeding.  Hematoma measuring 12 cm in its greatest dimension.  Metastatic liver disease was also noted, with follow-up MRI recommended as outpatient. EDP talked to general surgeon who did not recommend surgical intervention and suggested abdominal binder and trend hemoglobin.  Subjective: Patient was seen and examined this morning.  Pleasant elderly African-American female.  Propped up in bed.  Not in distress.  Has an abdominal binder in place. No family at bedside.  Principal Problem:   Intramuscular hematoma Active Problems:   Pulmonary embolus (HCC)   Essential hypertension   Type 2 diabetes mellitus ([place])   Aortic stenosis   History of lung cancer   Adenocarcinoma of left lung ([place])   Port-A-Cath in place    Assessment and Plan: Intramuscular hematoma -CT showing a large left rectus sheath hematoma extending to the symphysis pubis on the left compatible with intramuscular hematoma, with mixed attenuation suggesting ongoing bleeding.  -EDP discussed with general surgery, did not recommend surgical  intervention. -Lovenox has been temporarily held.  -Hemoglobin is 11.9 which is slightly better than yesterday.  Continue to monitor. Recent Labs    07/21/21 0512 07/22/21 0536 08/03/21 1404 08/03/21 2144 08/04/21 0303  HGB 11.5* 11.6* 13.2 13.0 11.9*  MCV 89.8 90.7 89.3  --  86.9    Pulmonary embolus in the setting of lung cancer -4/30, CT PA showed at least submassive PE on the right side.  Negative for DVT in lower extremities.  She was eventually discharged on Lovenox.  I presume oral anticoagulant was not tried because of her having active cancer.  -I discussed with patient's oncologist Dr. Delton Coombes this afternoon.  He recommended me to start her on heparin drip because of high risk of recurrence of PE.  If the hematoma worsens or hemoglobin drops, she may need angiogram for therapeutic ambulation of the bleeder.  Essential hypertension -Home blood pressure meds (Lasix 20 mg daily, carvedilol 3.125, Norvasc 5 mg) on hold because of soft blood pressure.    Type 2 diabetes mellitus -Controlled.  A1c 5.9.  Not on medication. -Daily CBG's   Aortic stenosis -Cardiac murmur present.  Recent echo 07/2021 shows moderate aortic valve stenosis,   Adenocarcinoma of left lung Follows with Dr. Delton Coombes.  Status post chemotherapy, immunotherapy.  Per recent note 04/2021, patient did not have evidence of metastatic disease in the chest.   -5/17, CT abd and pelvis with suspected new hepatic metastatic disease.  Radiology recommended MRI liver with and without  contrast as an outpatient setting. -Per request by her oncologist today, I will obtain MRI abdomen while she is here.   Goals of care  Code Status: Full Code    Mobility: PT eval ordered  Skin assessment:     Nutritional status:  Body mass index is 47.7 kg/m.          Diet:  Diet Order             Diet heart healthy/carb modified Room service appropriate? Yes; Fluid consistency: Thin  Diet effective now                    DVT prophylaxis:  SCDs Start: 08/03/21 2040   Antimicrobials: None Fluid: None Consultants: EDP discussed with surgery.  Oncologist on the phone Family Communication: None at bedside  Status is: Inpatient  Continue in-hospital care because: Need to continue to monitor hemoglobin because of large intra muscular hematoma Level of care: Telemetry Medical   Dispo: The patient is from: Home              Anticipated d/c is to: Home hopefully in next 1 to 2 days              Patient currently is not medically stable to d/c.   Difficult to place patient No     Infusions:    Scheduled Meds:  Chlorhexidine Gluconate Cloth  6 each Topical Daily   morphine (PF)  4 mg Intravenous Once   pantoprazole  40 mg Oral Daily    PRN meds: acetaminophen **OR** acetaminophen, HYDROcodone-acetaminophen, polyethylene glycol, promethazine   Antimicrobials: Anti-infectives (From admission, onward)    None       Objective: Vitals:   08/04/21 0403 08/04/21 0928  BP: 121/68 116/76  Pulse: 90 100  Resp: 19 18  Temp: 98 F (36.7 C) 98.9 F (37.2 C)  SpO2: 94% 92%    Intake/Output Summary (Last 24 hours) at 08/04/2021 1302 Last data filed at 08/04/2021 0616 Gross per 24 hour  Intake 776.58 ml  Output 300 ml  Net 476.58 ml   Filed Weights   08/03/21 1220 08/04/21 0403  Weight: 115.2 kg 118.3 kg   Weight change:  Body mass index is 47.7 kg/m.   Physical Exam: General exam: Pleasant elderly Caucasian female.  Not in physical distress Skin: No rashes, lesions or ulcers. HEENT: Atraumatic, normocephalic, no obvious bleeding Lungs: Clear to auscultation bilaterally CVS: Regular rate and rhythm, no murmur GI/Abd abdominal binder in place.  Nontender on palpation, CNS: Alert, awake, oriented x3 Psychiatry: Mood appropriate Extremities: Trace bilateral pedal edema  Data Review: I have personally reviewed the laboratory data and studies available.  F/u labs  ordered Unresulted Labs (From admission, onward)     Start     Ordered   08/05/21 3729  Basic metabolic panel  Tomorrow morning,   R        08/04/21 0932   08/05/21 0500  CBC with Differential/Platelet  Tomorrow morning,   R        08/04/21 0932            Signed, Terrilee Croak, MD [facility] 08/04/2021

## 2021-08-04 NOTE — Plan of Care (Signed)
  Problem: Nutrition: Goal: Adequate nutrition will be maintained Outcome: Progressing   Problem: Pain Managment: Goal: General experience of comfort will improve Outcome: Progressing   

## 2021-08-04 NOTE — Evaluation (Signed)
Physical Therapy Evaluation Patient Details Name: Margree Gimbel MRN: [id number] DOB: 30-Jan-1951 Today's Date: 08/04/2021  History of Present Illness  Pt is a 71 y/o female admitted secondary to LLQ pain. Imaging showed Large left  rectus sheath hematoma extending to the symphysis  pubis on the Left compatible with intramuscular hematoma. Pt with recent admission for PE and started on anticoagulation. PMH includes lung cancer, DM, and HTN.  Clinical Impression  Pt admitted secondary to problem above with deficits below. Pt requiring min guard A for mobility tasks using RW. Distance limited secondary to pain this session. Pt reports abdominal binder helped some with pain. Pt currently active with HHPT and recommend resumption at d/c. Pt reports sisters can assist as needed at d/c. Will continue to follow acutely.        Recommendations for follow up therapy are one component of a multi-disciplinary discharge planning process, led by the attending physician.  Recommendations may be updated based on patient status, additional functional criteria and insurance authorization.  Follow Up Recommendations Home health PT    Assistance Recommended at Discharge Intermittent Supervision/Assistance  Patient can return home with the following  A little help with bathing/dressing/bathroom;Assistance with cooking/housework;Assist for transportation    Equipment Recommendations Rolling walker (2 wheels)  Recommendations for Other Services       Functional Status Assessment Patient has had a recent decline in their functional status and demonstrates the ability to make significant improvements in function in a reasonable and predictable amount of time.     Precautions / Restrictions Precautions Precautions: Fall Required Braces or Orthoses: Other Brace Other Brace: abdominal binder Restrictions Weight Bearing Restrictions: No      Mobility  Bed Mobility Overal bed mobility: Needs Assistance Bed  Mobility: Supine to Sit, Sit to Supine     Supine to sit: HOB elevated, Min guard Sit to supine: Min assist   General bed mobility comments: Min A For LE assist for return to supine    Transfers Overall transfer level: Needs assistance Equipment used: Rolling walker (2 wheels) Transfers: Sit to/from Stand Sit to Stand: Min guard           General transfer comment: Min guard for safety. Increased time required secondary to pain    Ambulation/Gait Ambulation/Gait assistance: Min guard Gait Distance (Feet): 30 Feet Assistive device: Rolling walker (2 wheels) Gait Pattern/deviations: Step-through pattern, Decreased weight shift to left, Antalgic Gait velocity: Decreased     General Gait Details: Slow, antalgic gait. Min guard for safety. distance limited to within the room secondary to pain.  Stairs            Wheelchair Mobility    Modified Rankin (Stroke Patients Only)       Balance Overall balance assessment: Needs assistance Sitting-balance support: Feet supported Sitting balance-Leahy Scale: Fair     Standing balance support: Bilateral upper extremity supported Standing balance-Leahy Scale: Poor Standing balance comment: Reliant on UE support                             Pertinent Vitals/Pain Pain Assessment Pain Assessment: 0-10 Pain Score: 8  Pain Location: L groin Pain Descriptors / Indicators: Grimacing, Guarding, Moaning Pain Intervention(s): Limited activity within patient's tolerance, Monitored during session, Repositioned    Home Living Family/patient expects to be discharged to:: Private residence Living Arrangements: Other relatives (sister) Available Help at Discharge: Family;Available 24 hours/day Type of Home: House Home Access: Ramped entrance  Home Layout: One level Home Equipment: Cane - single point;Shower seat;BSC/3in1      Prior Function Prior Level of Function : Independent/Modified Independent              Mobility Comments: Uses cane normally       Hand Dominance        Extremity/Trunk Assessment   Upper Extremity Assessment Upper Extremity Assessment: Defer to OT evaluation    Lower Extremity Assessment Lower Extremity Assessment: LLE deficits/detail;Generalized weakness LLE Deficits / Details: Difficulty with hip flexion secondary to pain    Cervical / Trunk Assessment Cervical / Trunk Assessment: Normal  Communication   Communication: No difficulties  Cognition Arousal/Alertness: Awake/alert Behavior During Therapy: WFL for tasks assessed/performed Overall Cognitive Status: Within Functional Limits for tasks assessed                                          General Comments      Exercises     Assessment/Plan    PT Assessment Patient needs continued PT services  PT Problem List Decreased strength;Decreased activity tolerance;Decreased balance;Decreased mobility;Pain       PT Treatment Interventions DME instruction;Gait training;Stair training;Functional mobility training;Therapeutic activities;Therapeutic exercise;Patient/family education;Balance training    PT Goals (Current goals can be found in the Care Plan section)  Acute Rehab PT Goals Patient Stated Goal: to decrease pain and go home PT Goal Formulation: With patient Time For Goal Achievement: 08/18/21 Potential to Achieve Goals: Good    Frequency Min 3X/week     Co-evaluation               AM-PAC PT "6 Clicks" Mobility  Outcome Measure Help needed turning from your back to your side while in a flat bed without using bedrails?: None Help needed moving from lying on your back to sitting on the side of a flat bed without using bedrails?: A Little Help needed moving to and from a bed to a chair (including a wheelchair)?: A Little Help needed standing up from a chair using your arms (e.g., wheelchair or bedside chair)?: A Little Help needed to walk in hospital room?:  A Little Help needed climbing 3-5 steps with a railing? : A Lot 6 Click Score: 18    End of Session Equipment Utilized During Treatment: Gait belt Activity Tolerance: Patient limited by pain Patient left: in bed;with call bell/phone within reach Nurse Communication: Mobility status PT Visit Diagnosis: Unsteadiness on feet (R26.81);Difficulty in walking, not elsewhere classified (R26.2);Pain Pain - Right/Left: Left Pain - part of body:  (groin)    Time: 0488-8916 PT Time Calculation (min) (ACUTE ONLY): 15 min   Charges:   PT Evaluation $PT Eval Moderate Complexity: 1 Mod          Reuel Derby, PT, DPT  Acute Rehabilitation Services  Pager: [phone] Office: [phone]   Rudean Hitt 08/04/2021, 4:20 PM

## 2021-08-05 ENCOUNTER — Inpatient Hospital Stay (HOSPITAL_COMMUNITY): Payer: Medicare HMO

## 2021-08-05 DIAGNOSIS — T148XXA Other injury of unspecified body region, initial encounter: Secondary | ICD-10-CM | POA: Diagnosis not present

## 2021-08-05 LAB — GLUCOSE, CAPILLARY: Glucose-Capillary: 124 mg/dL — ABNORMAL HIGH (ref 70–99)

## 2021-08-05 LAB — CBC WITH DIFFERENTIAL/PLATELET
Abs Immature Granulocytes: 0.02 10*3/uL (ref 0.00–0.07)
Basophils Absolute: 0 10*3/uL (ref 0.0–0.1)
Basophils Relative: 0 %
Eosinophils Absolute: 0.1 10*3/uL (ref 0.0–0.5)
Eosinophils Relative: 1 %
HCT: 35.6 % — ABNORMAL LOW (ref 36.0–46.0)
Hemoglobin: 11.3 g/dL — ABNORMAL LOW (ref 12.0–15.0)
Immature Granulocytes: 0 %
Lymphocytes Relative: 13 %
Lymphs Abs: 1 10*3/uL (ref 0.7–4.0)
MCH: 27.3 pg (ref 26.0–34.0)
MCHC: 31.7 g/dL (ref 30.0–36.0)
MCV: 86 fL (ref 80.0–100.0)
Monocytes Absolute: 0.6 10*3/uL (ref 0.1–1.0)
Monocytes Relative: 8 %
Neutro Abs: 5.9 10*3/uL (ref 1.7–7.7)
Neutrophils Relative %: 78 %
Platelets: 310 10*3/uL (ref 150–400)
RBC: 4.14 MIL/uL (ref 3.87–5.11)
RDW: 15 % (ref 11.5–15.5)
WBC: 7.6 10*3/uL (ref 4.0–10.5)
nRBC: 0 % (ref 0.0–0.2)

## 2021-08-05 LAB — BASIC METABOLIC PANEL
Anion gap: 9 (ref 5–15)
BUN: 5 mg/dL — ABNORMAL LOW (ref 8–23)
CO2: 26 mmol/L (ref 22–32)
Calcium: 8.6 mg/dL — ABNORMAL LOW (ref 8.9–10.3)
Chloride: 106 mmol/L (ref 98–111)
Creatinine, Ser: 0.84 mg/dL (ref 0.44–1.00)
GFR, Estimated: >60 mL/min (ref >60)
Glucose, Bld: 125 mg/dL — ABNORMAL HIGH (ref 70–99)
Potassium: 3.6 mmol/L (ref 3.5–5.1)
Sodium: 141 mmol/L (ref 135–145)

## 2021-08-05 LAB — HEPARIN LEVEL (UNFRACTIONATED): Heparin Unfractionated: 0.35 IU/mL (ref 0.30–0.70)

## 2021-08-05 MED ORDER — HYDROCODONE-ACETAMINOPHEN 5-325 MG PO TABS
1.0000 | ORAL_TABLET | Freq: Four times a day (QID) | ORAL | Status: DC | PRN
  Administered 2021-08-05 – 2021-08-09 (×9): 1 via ORAL
  Filled 2021-08-05 (×10): qty 1

## 2021-08-05 MED ORDER — GADOBUTROL 1 MMOL/ML IV SOLN
10.0000 mL | Freq: Once | INTRAVENOUS | Status: AC | PRN
  Administered 2021-08-05: 10 mL via INTRAVENOUS

## 2021-08-05 NOTE — Progress Notes (Signed)
PROGRESS NOTE  Denise [name]  DOB: 1950/09/17  PCP: Iona Beard, MD YTK:[id number]  DOA: 08/03/2021  LOS: 2 days  Hospital Day: 3  Brief narrative: Denise [name] is a 71 y.o. female with PMH significant for recent diagnosis of pulm embolism, lung cancer with brain mets, DM 2, HTN, aortic stenosis, small bowel obstruction. Patient presented to the ED on 5/17 with complaint of left lower quadrant pain for 1 day. She was recently hospitalized 4/30 to 5/5 with pulmonary embolism, treated with IV heparin and discharged home on Lovenox.  Bilateral lower EXTR duplex negative for DVT.  Oral anticoagulant was not chosen because of her having active cancer as the cause of PE.  Hemodynamically stable. Hemoglobin was 13.2 CT abdomen/pelvis showed a large left rectus sheath hematoma extending to the symphysis pubis on the left compatible with intramuscular hematoma, also concerning for ongoing bleeding.  Hematoma measuring 12 cm in its greatest dimension.  Metastatic liver disease was also noted, with follow-up MRI recommended as outpatient. EDP talked to general surgeon who did not recommend surgical intervention and suggested abdominal binder and trend hemoglobin.  Subjective: Patient was seen and examined this morning.   Lying down in bed.  Complains of new right shoulder pain.   Underwent MRI abdomen this morning.  Pending report. Remains on heparin drip.  Hemoglobin roughly stable  Principal Problem:   Intramuscular hematoma Active Problems:   Pulmonary embolus (HCC)   Essential hypertension   Type 2 diabetes mellitus ([place])   Aortic stenosis   History of lung cancer   Adenocarcinoma of left lung ([place])   Port-A-Cath in place    Assessment and Plan: Intramuscular hematoma -CT showing a large left rectus sheath hematoma extending to the symphysis pubis on the left compatible with intramuscular hematoma, with mixed attenuation suggesting ongoing bleeding.  -EDP discussed with general surgery,  did not recommend surgical intervention. -Lovenox was held.  Currently on heparin drip per recommendation oncology. -Hemoglobin roughly stable. -Continue abdominal binder.  Continue to monitor hemoglobin. Recent Labs    07/22/21 0536 08/03/21 1404 08/03/21 2144 08/04/21 0303 08/05/21 0359  HGB 11.6* 13.2 13.0 11.9* 11.3*  MCV 90.7 89.3  --  86.9 86.0     Pulmonary embolus in the setting of lung cancer -4/30, CT PA showed at least submassive PE on the right side.  Negative for DVT in lower extremities.  She was eventually discharged on Lovenox.  I presume oral anticoagulant was not tried because of her having active cancer.  -5/18, case discussed with patient's oncologist Dr. Delton Coombes.  He was not aware of her diagnosis of PE in last hospitalization and a discharge on Lovenox.  Per his recommendation, I started the patient on heparin drip.  Continue to monitor hemoglobin.  Defer to oncology for the choice of anticoagulation post discharge.  Essential hypertension -Home blood pressure meds (Lasix 20 mg daily, carvedilol 3.125, Norvasc 5 mg) on hold because of soft blood pressure.    Type 2 diabetes mellitus -Controlled.  A1c 5.9.  Not on medication. -Daily CBG's   Aortic stenosis -Cardiac murmur present.  Recent echo 07/2021 shows moderate aortic valve stenosis,   Adenocarcinoma of left lung Follows with Dr. Delton Coombes.  Status post chemotherapy, immunotherapy.  Per recent note 04/2021, patient did not have evidence of metastatic disease in the chest.   -5/17, CT abd and pelvis with suspected new hepatic metastatic disease.  Per recommendation by radiology and oncology, I ordered for MRI abdomen pelvis.  Right shoulder pain -Likely  due to osteoarthritis.  Norco as needed   Goals of care   Code Status: Full Code    Mobility: PT eval ordered  Skin assessment:     Nutritional status:  Body mass index is 47.06 kg/m.          Diet:  Diet Order             Diet heart  healthy/carb modified Room service appropriate? Yes; Fluid consistency: Thin  Diet effective now                   DVT prophylaxis:  SCDs Start: 08/03/21 2040   Antimicrobials: None Fluid: None Consultants: EDP discussed with surgery.  Oncologist on the phone Family Communication: None at bedside  Status is: Inpatient  Continue in-hospital care because: Need to continue to monitor hemoglobin because of large intra muscular hematoma Level of care: Telemetry Medical   Dispo: The patient is from: Home              Anticipated d/c is to: Home hopefully in next 1 to 2 days              Patient currently is not medically stable to d/c.   Difficult to place patient No     Infusions:   heparin 1,100 Units/hr (08/05/21 0521)    Scheduled Meds:  Chlorhexidine Gluconate Cloth  6 each Topical Daily   morphine (PF)  4 mg Intravenous Once   pantoprazole  40 mg Oral Daily    PRN meds: acetaminophen **OR** acetaminophen, HYDROcodone-acetaminophen, polyethylene glycol, promethazine   Antimicrobials: Anti-infectives (From admission, onward)    None       Objective: Vitals:   08/05/21 0531 08/05/21 1147  BP: 123/66 124/66  Pulse: 86 87  Resp: 17 20  Temp: 98.6 F (37 C) 98.3 F (36.8 C)  SpO2: 93% 93%    Intake/Output Summary (Last 24 hours) at 08/05/2021 1234 Last data filed at 08/05/2021 0800 Gross per 24 hour  Intake 508.42 ml  Output 800 ml  Net -291.58 ml    Filed Weights   08/03/21 1220 08/04/21 0403 08/04/21 2100  Weight: 115.2 kg 118.3 kg 116.7 kg   Weight change: 1.486 kg Body mass index is 47.06 kg/m.   Physical Exam: General exam: Pleasant elderly Caucasian female.  Not in physical distress Skin: No rashes, lesions or ulcers. HEENT: Atraumatic, normocephalic, no obvious bleeding Lungs: Clear to auscultation bilaterally CVS: Regular rate and rhythm, no murmur GI/Abd minimal tenderness.  Abdominal binder in place.   CNS: Alert, awake, oriented  x3 Psychiatry: Mood appropriate Extremities: Trace bilateral pedal edema  Data Review: I have personally reviewed the laboratory data and studies available.  F/u labs ordered Unresulted Labs (From admission, onward)     Start     Ordered   08/05/21 0500  Heparin level (unfractionated)  Daily,   R      08/04/21 1353   08/05/21 0500  CBC  Daily,   R      08/04/21 1353            Signed, Terrilee Croak, MD [facility] 08/05/2021

## 2021-08-05 NOTE — Progress Notes (Signed)
ANTICOAGULATION CONSULT NOTE - follow up  Pharmacy Consult for Heparin Indication: h/o  recently diagnosed  pulmonary embolus  No Known Allergies  Patient Measurements: Height: 5\' 2"  (157.5 cm) Weight: 116.7 kg (257 lb 4.4 oz) IBW/kg (Calculated) : 50.1 Heparin Dosing Weight: 79.3 kg  Vital Signs: Temp: 98.3 F (36.8 C) (05/19 1147) Temp Source: Oral (05/19 1147) BP: 124/66 (05/19 1147) Pulse Rate: 87 (05/19 1147)  Labs: Recent Labs    08/03/21 1404 08/03/21 2144 08/04/21 0303 08/04/21 2218 08/05/21 0359  HGB 13.2 13.0 11.9*  --  11.3*  HCT 43.3 41.3 37.1  --  35.6*  PLT 299  --  294  --  310  HEPARINUNFRC  --   --   --  0.41 0.35  CREATININE 0.83  --  0.85  --  0.84     Estimated Creatinine Clearance: 75.5 mL/min (by C-G formula based on SCr of 0.84 mg/dL).   Medical History: Past Medical History:  Diagnosis Date   Anemia    Aortic stenosis    Arthritis    Cancer (HCC)    Lung   Coronary artery calcification seen on CT scan    Essential hypertension    GERD (gastroesophageal reflux disease)    History of lung cancer    Stage III adenocarcinoma status post chemoradiation   Port-A-Cath in place 01/06/2019   Type 2 diabetes mellitus [facility])       Assessment: 71 y.o female with lung cancer with brain mets. admitted on 08/03/21 with complaints of left lower quadrant pain of 1 day duration.  CT abdomen/pelvis 08/03/21 showed a large left rectus sheath hematoma extending to the symphysis pubis on the left compatible with intramuscular hematoma. She was on Lovenox treatment dose 120 mg SQ q12h prior to admit for recenty diagnosed pulmonary embolism on previous hospitalization 4/30 to 5/52 and it was noted that oral anticoagulant was not chosen because of her having active cancer as the PE.   Lovenox held here> pharmacy consulted to start IV heparin infusion. No bolus due to large left rectus sheath hematoma.  08/05/21:   heparin level  0.35 is therapeutic on  heparin 1100 units/hr.  No bleeding noted.  Hgb 11.3, roughly stable. PLTC wnl stable.   Underwent MRI abdomen this morning.  Reprot pending.   Goal of Therapy:  Heparin level 0.3-0.7 units/ml Monitor platelets by anticoagulation protocol: Yes   Plan:  Continue heparin 1100 units/hr  Daily HL and CBC Monitor for s/sx of bleeding,  follow for status/progress of  left rectus hematoma  Nicole Cella, RPh Clinical Pharmacist [phone] 08/05/2021 4:11 PM Please check AMION for all [place] phone numbers After 10:00 PM, call [place] [phone] '

## 2021-08-05 NOTE — Evaluation (Signed)
Occupational Therapy Evaluation Patient Details Name: Denise [name] MRN: [id number] DOB: Aug 18, 1950 Today's Date: 08/05/2021   History of Present Illness Pt is a 71 y/o female admitted secondary to LLQ pain. Imaging showed Large left  rectus sheath hematoma extending to the symphysis  pubis on the Left compatible with intramuscular hematoma. Pt with recent admission for PE and started on anticoagulation. PMH includes lung cancer, DM, and HTN.   Clinical Impression   Pt admitted for concerns listed above. PTA pt reported that she was independent with all ADL's and IADL's, including working for Hospice. At this time, pt is limited mainly by pain and mild balance deficits. She is requiring increased assist for LB due to difficulty bending to reach. Functional mobility limited by pain and she is requiring the use of a RW for safety at this time. Recommending HHOT to maximize her independence and safety at home. OT will follow acutely.       Recommendations for follow up therapy are one component of a multi-disciplinary discharge planning process, led by the attending physician.  Recommendations may be updated based on patient status, additional functional criteria and insurance authorization.   Follow Up Recommendations  Home health OT    Assistance Recommended at Discharge Intermittent Supervision/Assistance  Patient can return home with the following A little help with walking and/or transfers;A lot of help with bathing/dressing/bathroom;Assistance with cooking/housework    Functional Status Assessment  Patient has had a recent decline in their functional status and demonstrates the ability to make significant improvements in function in a reasonable and predictable amount of time.  Equipment Recommendations  BSC/3in1;Other (comment) (RW)    Recommendations for Other Services       Precautions / Restrictions Precautions Precautions: Fall Restrictions Weight Bearing Restrictions: No       Mobility Bed Mobility Overal bed mobility: Needs Assistance Bed Mobility: Supine to Sit, Sit to Supine     Supine to sit: Supervision, HOB elevated Sit to supine: Min assist   General bed mobility comments: Min A For LE assist for return to supine    Transfers Overall transfer level: Needs assistance Equipment used: Rolling walker (2 wheels) Transfers: Sit to/from Stand Sit to Stand: Min guard           General transfer comment: Min guard for safety. Increased time required secondary to pain      Balance Overall balance assessment: Needs assistance Sitting-balance support: Feet supported Sitting balance-Leahy Scale: Fair     Standing balance support: Bilateral upper extremity supported Standing balance-Leahy Scale: Poor Standing balance comment: Reliant on UE support                           ADL either performed or assessed with clinical judgement   ADL Overall ADL's : Needs assistance/impaired Eating/Feeding: Independent;Sitting   Grooming: Supervision/safety;Standing   Upper Body Bathing: Supervision/ safety;Sitting   Lower Body Bathing: Moderate assistance;Sitting/lateral leans;Bed level   Upper Body Dressing : Independent;Sitting   Lower Body Dressing: Moderate assistance;Sitting/lateral leans;Sit to/from stand   Toilet Transfer: Agricultural engineer (2 wheels)   Toileting- Clothing Manipulation and Hygiene: Minimal assistance;Sitting/lateral lean       Functional mobility during ADLs: Min guard;Rolling walker (2 wheels) General ADL Comments: Pt limited by pain     Vision Baseline Vision/History: 0 No visual deficits Ability to See in Adequate Light: 0 Adequate Patient Visual Report: No change from baseline Vision Assessment?: No apparent visual deficits  Perception     Praxis      Pertinent Vitals/Pain Pain Assessment Pain Assessment: Faces Faces Pain Scale: Hurts even more Pain Location: L groin Pain Descriptors /  Indicators: Grimacing, Guarding, Moaning Pain Intervention(s): Limited activity within patient's tolerance, Monitored during session, Repositioned     Hand Dominance Right   Extremity/Trunk Assessment Upper Extremity Assessment Upper Extremity Assessment: Overall WFL for tasks assessed   Lower Extremity Assessment Lower Extremity Assessment: Defer to PT evaluation   Cervical / Trunk Assessment Cervical / Trunk Assessment: Normal   Communication Communication Communication: No difficulties   Cognition Arousal/Alertness: Awake/alert Behavior During Therapy: WFL for tasks assessed/performed Overall Cognitive Status: Within Functional Limits for tasks assessed                                       General Comments  VSS on RA    Exercises     Shoulder Instructions      Home Living Family/patient expects to be discharged to:: Private residence Living Arrangements: Parent;Other relatives Available Help at Discharge: Family;Available 24 hours/day Type of Home: House Home Access: Ramped entrance     Home Layout: One level     Bathroom Shower/Tub: Occupational psychologist: Handicapped height     Home Equipment: [place] - single point;Shower seat          Prior Functioning/Environment Prior Level of Function : Independent/Modified Independent             Mobility Comments: Uses cane normally ADLs Comments: Independent        OT Problem List: Decreased activity tolerance;Impaired balance (sitting and/or standing);Obesity;Pain      OT Treatment/Interventions: Self-care/ADL training;Therapeutic exercise;Energy conservation;DME and/or AE instruction;Therapeutic activities;Balance training;Patient/family education    OT Goals(Current goals can be found in the care plan section) Acute Rehab OT Goals Patient Stated Goal: To reduce pain OT Goal Formulation: With patient Time For Goal Achievement: 08/19/21 Potential to Achieve Goals:  Good ADL Goals Pt Will Perform Lower Body Bathing: with modified independence;sitting/lateral leans;sit to/from stand;with adaptive equipment Pt Will Perform Lower Body Dressing: with modified independence;with adaptive equipment;sitting/lateral leans;sit to/from stand Pt Will Transfer to Toilet: with modified independence;ambulating Pt Will Perform Toileting - Clothing Manipulation and hygiene: with modified independence;sitting/lateral leans;sit to/from stand;with adaptive equipment  OT Frequency: Min 2X/week    Co-evaluation              AM-PAC OT "6 Clicks" Daily Activity     Outcome Measure Help from another person eating meals?: None Help from another person taking care of personal grooming?: A Little Help from another person toileting, which includes using toliet, bedpan, or urinal?: A Lot Help from another person bathing (including washing, rinsing, drying)?: A Lot Help from another person to put on and taking off regular upper body clothing?: None Help from another person to put on and taking off regular lower body clothing?: A Lot 6 Click Score: 17   End of Session Equipment Utilized During Treatment: Rolling walker (2 wheels) Nurse Communication: Mobility status  Activity Tolerance: Patient tolerated treatment well Patient left: in bed;with call bell/phone within reach  OT Visit Diagnosis: Unsteadiness on feet (R26.81);Other abnormalities of gait and mobility (R26.89);Muscle weakness (generalized) (M62.81);Pain                Time: 6222-9798 OT Time Calculation (min): 27 min Charges:  OT General Charges $OT Visit: 1  Visit OT Evaluation $OT Eval Moderate Complexity: 1 Mod OT Treatments $Self Care/Home Management : 8-22 mins  Emersyn Kotarski H., OTR/L Acute Rehabilitation  Chiffon Kittleson Elane Yolanda Bonine 08/05/2021, 6:36 PM

## 2021-08-06 DIAGNOSIS — T148XXA Other injury of unspecified body region, initial encounter: Secondary | ICD-10-CM | POA: Diagnosis not present

## 2021-08-06 LAB — HEPARIN LEVEL (UNFRACTIONATED)
Heparin Unfractionated: 0.27 IU/mL — ABNORMAL LOW (ref 0.30–0.70)
Heparin Unfractionated: 0.27 IU/mL — ABNORMAL LOW (ref 0.30–0.70)

## 2021-08-06 LAB — CBC
HCT: 34.9 % — ABNORMAL LOW (ref 36.0–46.0)
Hemoglobin: 11.3 g/dL — ABNORMAL LOW (ref 12.0–15.0)
MCH: 28.3 pg (ref 26.0–34.0)
MCHC: 32.4 g/dL (ref 30.0–36.0)
MCV: 87.3 fL (ref 80.0–100.0)
Platelets: 342 10*3/uL (ref 150–400)
RBC: 4 MIL/uL (ref 3.87–5.11)
RDW: 14.9 % (ref 11.5–15.5)
WBC: 7.7 10*3/uL (ref 4.0–10.5)
nRBC: 0.3 % — ABNORMAL HIGH (ref 0.0–0.2)

## 2021-08-06 LAB — GLUCOSE, CAPILLARY
Glucose-Capillary: 122 mg/dL — ABNORMAL HIGH (ref 70–99)
Glucose-Capillary: 105 mg/dL — ABNORMAL HIGH (ref 70–99)
Glucose-Capillary: 91 mg/dL (ref 70–99)
Glucose-Capillary: 104 mg/dL — ABNORMAL HIGH (ref 70–99)

## 2021-08-06 NOTE — Progress Notes (Signed)
ANTICOAGULATION CONSULT NOTE - follow up  Pharmacy Consult for Heparin Indication: h/o  recently diagnosed  pulmonary embolus  No Known Allergies  Patient Measurements: Height: 5\' 2"  (157.5 cm) Weight: 116.7 kg (257 lb 4.4 oz) IBW/kg (Calculated) : 50.1 Heparin Dosing Weight: 79.3 kg  Vital Signs: Temp: 97.9 F (36.6 C) (05/20 0833) Temp Source: Oral (05/20 0833) BP: 94/58 (05/20 0833) Pulse Rate: 82 (05/20 0833)  Labs: Recent Labs    08/03/21 1404 08/03/21 2144 08/04/21 0303 08/04/21 2218 08/05/21 0359 08/06/21 0927  HGB 13.2   < > 11.9*  --  11.3* 11.3*  HCT 43.3   < > 37.1  --  35.6* 34.9*  PLT 299  --  294  --  310 342  HEPARINUNFRC  --   --   --  0.41 0.35 0.27*  CREATININE 0.83  --  0.85  --  0.84  --    < > = values in this interval not displayed.     Estimated Creatinine Clearance: 75.5 mL/min (by C-G formula based on SCr of 0.84 mg/dL).   Medical History: Past Medical History:  Diagnosis Date   Anemia    Aortic stenosis    Arthritis    Cancer (HCC)    Lung   Coronary artery calcification seen on CT scan    Essential hypertension    GERD (gastroesophageal reflux disease)    History of lung cancer    Stage III adenocarcinoma status post chemoradiation   Port-A-Cath in place 01/06/2019   Type 2 diabetes mellitus [facility])       Assessment: 71 y.o female with lung cancer with brain mets. admitted on 08/03/21 with complaints of left lower quadrant pain of 1 day duration.  CT abdomen/pelvis 08/03/21 showed a large left rectus sheath hematoma extending to the symphysis pubis on the left compatible with intramuscular hematoma. She was on Lovenox treatment dose 120 mg SQ q12h prior to admit for recenty diagnosed pulmonary embolism on previous hospitalization 4/30 to 5/52 and it was noted that oral anticoagulant was not chosen because of her having active cancer as the PE.   Lovenox held here> pharmacy consulted to start IV heparin infusion. No bolus due to  large left rectus sheath hematoma.  08/06/21:   heparin level =  0.27, decreased/ sub-therapeutic on heparin 1100 ut/hr.  Hgb 11.3 down from admit but stable PLTC is wnl stable. No overt bleeding reported.   5/19 MRI abd: Bilobed mass versus 2 adjacent masses in the right hepatic lobe, highly suspicious for liver metastases. CT Liver biopsy ordered.      Goal of Therapy:  Heparin level 0.3-0.7 units/ml Monitor platelets by anticoagulation protocol: Yes   Plan:  Increase heparin 1300 units/hr  Check 8 hr HL Daily HL and CBC Monitor for s/sx of bleeding,  follow for status/progress of  left rectus hematoma  Nicole Cella, RPh Clinical Pharmacist [phone] 08/06/2021 11:37 AM Please check AMION for all [place] phone numbers After 10:00 PM, call [place] [phone] '

## 2021-08-06 NOTE — Progress Notes (Signed)
PROGRESS NOTE  Denise [name]  DOB: 1951/02/09  PCP: Iona Beard, MD GDJ:[id number]  DOA: 08/03/2021  LOS: 3 days  Hospital Day: 4  Brief narrative: Sebrina [name] is a 71 y.o. female with PMH significant for recent diagnosis of pulm embolism, lung cancer with brain mets, DM 2, HTN, aortic stenosis, small bowel obstruction. Patient presented to the ED on 5/17 with complaint of left lower quadrant pain for 1 day. She was recently hospitalized 4/30 to 5/5 with pulmonary embolism, treated with IV heparin and discharged home on Lovenox.  Bilateral lower EXTR duplex negative for DVT.  Oral anticoagulant was not chosen because of her having active cancer as the cause of PE.  Hemodynamically stable. Hemoglobin was 13.2 CT abdomen/pelvis showed a large left rectus sheath hematoma extending to the symphysis pubis on the left compatible with intramuscular hematoma, also concerning for ongoing bleeding.  Hematoma measuring 12 cm in its greatest dimension.  Metastatic liver disease was also noted, with follow-up MRI recommended as outpatient. EDP talked to general surgeon who did not recommend surgical intervention and suggested abdominal binder and trend hemoglobin.  Subjective: Patient was seen and examined this morning.   Lying on bed.  Not in distress.  Abdominal pain improving.  Remains on heparin drip.  Hemoglobin stable.   Family members at bedside.   We discussed about the MRI abdomen findings and recommendation of liver biopsy by the oncologist.  Principal Problem:   Intramuscular hematoma Active Problems:   Pulmonary embolus (HCC)   Essential hypertension   Type 2 diabetes mellitus ([place])   Aortic stenosis   History of lung cancer   Adenocarcinoma of left lung ([place])   Port-A-Cath in place    Assessment and Plan: Intramuscular hematoma -CT showing a large left rectus sheath hematoma extending to the symphysis pubis on the left compatible with intramuscular hematoma, with mixed  attenuation suggesting ongoing bleeding.  -EDP discussed the case with general surgery, did not recommend surgical intervention. -Lovenox was held.  Currently on heparin drip per recommendation oncology. -Hemoglobin roughly stable. -Continue abdominal binder.  Continue to monitor hemoglobin. Recent Labs    08/03/21 1404 08/03/21 2144 08/04/21 0303 08/05/21 0359 08/06/21 0927  HGB 13.2 13.0 11.9* 11.3* 11.3*  MCV 89.3  --  86.9 86.0 87.3     Pulmonary embolus in the setting of lung cancer -4/30, CT PA showed at least submassive PE on the right side.  Negative for DVT in lower extremities.  She was eventually discharged on Lovenox.  I presume oral anticoagulant was not tried because of her having active cancer.  -5/18, case discussed with patient's oncologist Dr. Delton Coombes.  He was not aware of her diagnosis of PE in last hospitalization and a discharge on Lovenox.  Per his recommendation, I started the patient on heparin drip.  Continue to monitor hemoglobin.  Defer to oncology for the choice of anticoagulation post discharge.  Adenocarcinoma of left lung Follows with Dr. Delton Coombes.  Status post chemotherapy, immunotherapy.  Per recent note 04/2021, patient did not have evidence of metastatic disease in the chest.   -5/17, CT abd and pelvis with suspected new hepatic metastatic disease.  Per recommendation by radiology and oncology, I ordered for MRI abdomen pelvis.  Showed 2 liver masses.  Biopsy recommended by Dr. Delton Coombes.  IR consulted.  Essential hypertension -Home blood pressure meds (Lasix 20 mg daily, carvedilol 3.125, Norvasc 5 mg) on hold because of soft blood pressure.  Blood pressure in 90s this morning.  Continue to  hold   Type 2 diabetes mellitus -Controlled.  A1c 5.9.  Not on medication. -Daily CBG's   Aortic stenosis -Cardiac murmur present.  Recent echo 07/2021 shows moderate aortic valve stenosis,   Right shoulder pain -Likely due to osteoarthritis.  Norco as  needed   Goals of care   Code Status: Full Code    Mobility: PT eval ordered  Skin assessment:     Nutritional status:  Body mass index is 47.06 kg/m.          Diet:  Diet Order             Diet heart healthy/carb modified Room service appropriate? Yes; Fluid consistency: Thin  Diet effective now                   DVT prophylaxis:  SCDs Start: 08/03/21 2040   Antimicrobials: None Fluid: None Consultants: EDP discussed with surgery.  Oncologist on the phone, IR Family Communication: None at bedside  Status is: Inpatient  Continue in-hospital care because: Need to continue to monitor hemoglobin because of large intra muscular hematoma Level of care: Telemetry Medical   Dispo: The patient is from: Home              Anticipated d/c is to: Home hopefully in next 1 to 2 days              Patient currently is not medically stable to d/c.   Difficult to place patient No     Infusions:   heparin 1,300 Units/hr (08/06/21 1256)    Scheduled Meds:  Chlorhexidine Gluconate Cloth  6 each Topical Daily   morphine (PF)  4 mg Intravenous Once   pantoprazole  40 mg Oral Daily    PRN meds: acetaminophen **OR** acetaminophen, HYDROcodone-acetaminophen, polyethylene glycol, promethazine   Antimicrobials: Anti-infectives (From admission, onward)    None       Objective: Vitals:   08/06/21 0454 08/06/21 0833  BP: (!) 104/58 (!) 94/58  Pulse: 78 82  Resp: 17 18  Temp: 98.5 F (36.9 C) 97.9 F (36.6 C)  SpO2: 97% 100%    Intake/Output Summary (Last 24 hours) at 08/06/2021 1439 Last data filed at 08/06/2021 1300 Gross per 24 hour  Intake 1317 ml  Output 1000 ml  Net 317 ml    Filed Weights   08/03/21 1220 08/04/21 0403 08/04/21 2100  Weight: 115.2 kg 118.3 kg 116.7 kg   Weight change:  Body mass index is 47.06 kg/m.   Physical Exam: General exam: Pleasant elderly Caucasian female.  Not in visible distress Skin: No rashes, lesions or  ulcers. HEENT: Atraumatic, normocephalic, no obvious bleeding Lungs: Clear to auscultation bilaterally CVS: Regular rate and rhythm, no murmur GI/Abd minimal tenderness.  Abdominal binder in place.   CNS: Alert, awake, oriented x3 Psychiatry: Mood appropriate Extremities: Trace bilateral pedal edema  Data Review: I have personally reviewed the laboratory data and studies available.  F/u labs ordered Unresulted Labs (From admission, onward)     Start     Ordered   08/06/21 2000  Heparin level (unfractionated)  Once-Timed,   TIMED       Question:  Specimen collection method  Answer:  Lab=Lab collect   08/06/21 1148   08/05/21 0500  Heparin level (unfractionated)  Daily,   R      08/04/21 1353   08/05/21 0500  CBC  Daily,   R      08/04/21 1353  Signed, Terrilee Croak, MD [facility] 08/06/2021

## 2021-08-06 NOTE — Plan of Care (Signed)
  Problem: Skin Integrity: Goal: Risk for impaired skin integrity will decrease Outcome: Adequate for Discharge

## 2021-08-06 NOTE — Progress Notes (Signed)
ANTICOAGULATION CONSULT NOTE - follow up  Pharmacy Consult for Heparin Indication: h/o  recently diagnosed  pulmonary embolus  No Known Allergies  Patient Measurements: Height: 5\' 2"  (157.5 cm) Weight: 116.7 kg (257 lb 4.4 oz) IBW/kg (Calculated) : 50.1 Heparin Dosing Weight: 79.3 kg  Vital Signs: Temp: 98.4 F (36.9 C) (05/20 1655) Temp Source: Oral (05/20 1655) BP: 112/69 (05/20 1655) Pulse Rate: 81 (05/20 1655)  Labs: Recent Labs    08/04/21 0303 08/04/21 2218 08/05/21 0359 08/06/21 0927 08/06/21 2122  HGB 11.9*  --  11.3* 11.3*  --   HCT 37.1  --  35.6* 34.9*  --   PLT 294  --  310 342  --   HEPARINUNFRC  --    < > 0.35 0.27* 0.27*  CREATININE 0.85  --  0.84  --   --    < > = values in this interval not displayed.     Estimated Creatinine Clearance: 75.5 mL/min (by C-G formula based on SCr of 0.84 mg/dL).   Assessment: 71 y.o female with lung cancer with brain mets. admitted on 08/03/21 with complaints of left lower quadrant pain of 1 day duration.  CT abdomen/pelvis 08/03/21 showed a large left rectus sheath hematoma extending to the symphysis pubis on the left compatible with intramuscular hematoma. She was on Lovenox treatment dose 120 mg SQ q12h prior to admit for recenty diagnosed pulmonary embolism on previous hospitalization 4/30 to 5/52 and it was noted that oral anticoagulant was not chosen because of her having active cancer as the PE.   Lovenox held here> pharmacy consulted to start IV heparin infusion. No bolus due to large left rectus sheath hematoma.  Heparin level subtherapeutic (0.27) on infusion at 1300 units/hr. No change after last increase. No issues with line or bleeding reported per RN.  Goal of Therapy:  Heparin level 0.3-0.7 units/ml Monitor platelets by anticoagulation protocol: Yes   Plan:  Increase heparin to 1450 units/hr  Check 8 hr HL Monitor for s/sx of bleeding  Sherlon Handing, PharmD, BCPS Please see amion for complete clinical  pharmacist phone list 08/06/2021 10:23 PM

## 2021-08-07 DIAGNOSIS — T148XXA Other injury of unspecified body region, initial encounter: Secondary | ICD-10-CM | POA: Diagnosis not present

## 2021-08-07 LAB — CBC
HCT: 36.7 % (ref 36.0–46.0)
Hemoglobin: 11.7 g/dL — ABNORMAL LOW (ref 12.0–15.0)
MCH: 27.9 pg (ref 26.0–34.0)
MCHC: 31.9 g/dL (ref 30.0–36.0)
MCV: 87.6 fL (ref 80.0–100.0)
Platelets: 402 10*3/uL — ABNORMAL HIGH (ref 150–400)
RBC: 4.19 MIL/uL (ref 3.87–5.11)
RDW: 15 % (ref 11.5–15.5)
WBC: 7.4 10*3/uL (ref 4.0–10.5)
nRBC: 0 % (ref 0.0–0.2)

## 2021-08-07 LAB — GLUCOSE, CAPILLARY
Glucose-Capillary: 138 mg/dL — ABNORMAL HIGH (ref 70–99)
Glucose-Capillary: 80 mg/dL (ref 70–99)
Glucose-Capillary: 127 mg/dL — ABNORMAL HIGH (ref 70–99)

## 2021-08-07 LAB — HEPARIN LEVEL (UNFRACTIONATED): Heparin Unfractionated: 0.44 IU/mL (ref 0.30–0.70)

## 2021-08-07 MED ORDER — SODIUM CHLORIDE 0.9% FLUSH
10.0000 mL | Freq: Two times a day (BID) | INTRAVENOUS | Status: DC
  Administered 2021-08-08: 10 mL

## 2021-08-07 MED ORDER — SODIUM CHLORIDE 0.9% FLUSH: 10.0000 mL | INTRAVENOUS | Status: DC | PRN

## 2021-08-07 NOTE — Progress Notes (Signed)
PROGRESS NOTE  Denise [name]  DOB: 01-02-1951  PCP: Denise Beard, MD DTO:[id number]  DOA: 08/03/2021  LOS: 4 days  Hospital Day: 5  Brief narrative: Denise [name] is a 71 y.o. female with PMH significant for recent diagnosis of pulm embolism, lung cancer with brain mets, DM 2, HTN, aortic stenosis, small bowel obstruction. Patient presented to the ED on 5/17 with complaint of left lower quadrant pain for 1 day. She was recently hospitalized 4/30 to 5/5 with pulmonary embolism, treated with IV heparin and discharged home on Lovenox.  Bilateral lower EXTR duplex negative for DVT.  Oral anticoagulant was not chosen because of her having active cancer as the cause of PE.  Hemodynamically stable. Hemoglobin was 13.2 CT abdomen/pelvis showed a large left rectus sheath hematoma extending to the symphysis pubis on the left compatible with intramuscular hematoma, also concerning for ongoing bleeding.  Hematoma measuring 12 cm in its greatest dimension.  Metastatic liver disease was also noted, with follow-up MRI recommended as outpatient. EDP talked to general surgeon who did not recommend surgical intervention and suggested abdominal binder and trend hemoglobin.  Subjective: Patient was seen and examined this morning.   Lying down in bed.  Not in distress. Abdominal pain improving.  Hemoglobin stable.  Remains on heparin drip.  Pending liver biopsy  Principal Problem:   Intramuscular hematoma Active Problems:   Pulmonary embolus (HCC)   Essential hypertension   Type 2 diabetes mellitus ([place])   Aortic stenosis   History of lung cancer   Adenocarcinoma of left lung (HCC)   Port-A-Cath in place    Assessment and Plan: Intramuscular hematoma -CT showing a large left rectus sheath hematoma extending to the symphysis pubis on the left compatible with intramuscular hematoma, with mixed attenuation suggesting ongoing bleeding.  -EDP discussed the case with general surgery, did not recommend  surgical intervention. -Lovenox was held.  Currently on heparin drip per recommendation oncology. -Hemoglobin remained stable on heparin drip. -Continue abdominal binder.  Continue to monitor hemoglobin. Recent Labs    08/03/21 2144 08/04/21 0303 08/05/21 0359 08/06/21 0927 08/07/21 0818  HGB 13.0 11.9* 11.3* 11.3* 11.7*  MCV  --  86.9 86.0 87.3 87.6     Pulmonary embolus in the setting of lung cancer -4/30, CT PA showed at least submassive PE on the right side.  Negative for DVT in lower extremities.  She was eventually discharged on Lovenox.  I presume oral anticoagulant was not tried because of her having active cancer.  -5/18, case discussed with patient's oncologist Denise [name].  He was not aware of her diagnosis of PE in last hospitalization and a discharge on Lovenox.  Per his recommendation, I started the patient on heparin drip.  Continue to monitor hemoglobin.  Defer to oncology for the choice of anticoagulation post discharge.  Adenocarcinoma of left lung Follows with Denise [name].  Status post chemotherapy, immunotherapy.  Per recent note 04/2021, patient did not have evidence of metastatic disease in the chest.   -5/17, CT abd and pelvis with suspected new hepatic metastatic disease.  Per recommendation by radiology and oncology, I ordered for MRI abdomen pelvis.  8 showed 2 liver masses.  Biopsy recommended by Denise [name].  IR consulted.  Essential hypertension -Home blood pressure meds (Lasix 20 mg daily, carvedilol 3.125, Norvasc 5 mg) on hold because of soft blood pressure.  Blood pressure in 90s this morning.  Continue to hold   Type 2 diabetes mellitus -Controlled.  A1c 5.9.  Not on medication. -  Daily CBGs   Aortic stenosis -Cardiac murmur present.   -Recent echo 07/2021 shows moderate aortic valve stenosis.  Right shoulder pain -Likely due to osteoarthritis.  Norco as needed   Goals of care   Code Status: Full Code    Mobility: PT eval  obtained.  Skin assessment:     Nutritional status:  Body mass index is 47.06 kg/m.          Diet:  Diet Order             Diet heart healthy/carb modified Room service appropriate? Yes; Fluid consistency: Thin  Diet effective now                   DVT prophylaxis:  SCDs Start: 08/03/21 2040   Antimicrobials: None Fluid: None Consultants: EDP discussed with surgery.  Oncologist on the phone, IR Family Communication: None at bedside  Status is: Inpatient  Continue in-hospital care because: Pending liver biopsy  level of care: Telemetry Medical   Dispo: The patient is from: Home              Anticipated d/c is to: Home hopefully in next 1 to 2 days              Patient currently is not medically stable to d/c.   Difficult to place patient No     Infusions:   heparin 1,450 Units/hr (08/07/21 0329)    Scheduled Meds:  Chlorhexidine Gluconate Cloth  6 each Topical Daily   morphine (PF)  4 mg Intravenous Once   pantoprazole  40 mg Oral Daily    PRN meds: acetaminophen **OR** acetaminophen, HYDROcodone-acetaminophen, polyethylene glycol, promethazine   Antimicrobials: Anti-infectives (From admission, onward)    None       Objective: Vitals:   08/07/21 0604 08/07/21 0842  BP: (!) 103/57 (!) 111/59  Pulse: 84 89  Resp: 18 16  Temp: 98.1 F (36.7 C) 97.8 F (36.6 C)  SpO2: 100% 100%    Intake/Output Summary (Last 24 hours) at 08/07/2021 1149 Last data filed at 08/07/2021 0900 Gross per 24 hour  Intake 1482.69 ml  Output 1600 ml  Net -117.31 ml    Filed Weights   08/03/21 1220 08/04/21 0403 08/04/21 2100  Weight: 115.2 kg 118.3 kg 116.7 kg   Weight change:  Body mass index is 47.06 kg/m.   Physical Exam: General exam: Pleasant elderly Caucasian female.  Not in distress Skin: No rashes, lesions or ulcers. HEENT: Atraumatic, normocephalic, no obvious bleeding Lungs: Clear to auscultation bilaterally CVS: Regular rate and rhythm,  no murmur GI/Abd minimal tenderness improving.  Abdominal binder in place.   CNS: Alert, awake, oriented x3 Psychiatry: Mood appropriate Extremities: Trace bilateral pedal edema  Data Review: I have personally reviewed the laboratory data and studies available.  F/u labs ordered Unresulted Labs (From admission, onward)     Start     Ordered   08/08/21 0500  Heparin level (unfractionated)  Daily,   R     Question:  Specimen collection method  Answer:  Lab=Lab collect   08/06/21 2227   08/08/21 2778  Basic metabolic panel  Daily,   R     Question:  Specimen collection method  Answer:  Lab=Lab collect   08/07/21 0811   08/05/21 0500  CBC  Daily,   R      08/04/21 1353            Signed, Terrilee Croak, MD [facility] 08/07/2021

## 2021-08-07 NOTE — TOC Progression Note (Signed)
Transition of Care [facility]) - Progression Note    Patient Details  Name: Denise [name] MRN: [id number] Date of Birth: April 12, 1950  [facility]) CM/SW Contact  Bartholomew Crews, RN Phone Number: [phone] 08/07/2021, 10:27 AM  Clinical Narrative:     Spoke with patient on hospital room phone to discuss post acute transition. Confirmed currently active with [facility]. Patient stated that she would like to continue services. Patient will need [facility] PT/OT orders with Face to Face. Confirmed PCP and preferred pharmacy as listed in [place]. Patient stated that she has people she can depend on at home and has transportation home. TOC following for transition needs.   Expected Discharge Plan: [place] Barriers to Discharge: Continued Medical Work up  Expected Discharge Plan and Services Expected Discharge Plan: [place]   Discharge Planning Services: CM Consult   Living arrangements for the past 2 months: Single Family Home                           HH Arranged: OT, PT [facility] Agency: [place] ([facility]) Date [place]: 08/07/21 Time [place]: 1026 Representative spoke with at [place]: [employer] ([place]) Interventions    Readmission Risk Interventions     View : No data to display.

## 2021-08-07 NOTE — Plan of Care (Signed)
  Problem: Nutrition: Goal: Adequate nutrition will be maintained Outcome: Adequate for Discharge   

## 2021-08-07 NOTE — Progress Notes (Signed)
ANTICOAGULATION CONSULT NOTE - follow up  Pharmacy Consult for Heparin Indication: h/o  recently diagnosed  pulmonary embolus  No Known Allergies  Patient Measurements: Height: 5\' 2"  (157.5 cm) Weight: 116.7 kg (257 lb 4.4 oz) IBW/kg (Calculated) : 50.1 Heparin Dosing Weight: 79.3 kg  Vital Signs: Temp: 97.8 F (36.6 C) (05/21 0842) Temp Source: Oral (05/21 0842) BP: 111/59 (05/21 0842) Pulse Rate: 89 (05/21 0842)  Labs: Recent Labs    08/05/21 0359 08/06/21 0927 08/06/21 2122 08/07/21 0818  HGB 11.3* 11.3*  --  11.7*  HCT 35.6* 34.9*  --  36.7  PLT 310 342  --  402*  HEPARINUNFRC 0.35 0.27* 0.27* 0.44  CREATININE 0.84  --   --   --      Estimated Creatinine Clearance: 75.5 mL/min (by C-G formula based on SCr of 0.84 mg/dL).   Assessment: 71 y.o female with lung cancer with brain mets. admitted on 08/03/21 with complaints of left lower quadrant pain of 1 day duration.  CT abdomen/pelvis 08/03/21 showed a large left rectus sheath hematoma extending to the symphysis pubis on the left compatible with intramuscular hematoma. She was on Lovenox treatment dose 120 mg SQ q12h prior to admit for recenty diagnosed pulmonary embolism on previous hospitalization 4/30 to 5/52 and it was noted that oral anticoagulant was not chosen because of her having active cancer as the PE.   Lovenox held here> pharmacy consulted to start IV heparin infusion. No bolus due to large left rectus sheath hematoma.  Heparin level  0.44 is therapeutic on IV heparin 1450 ut/hr , level drawn 9 hr post rate increase. No boluses due to large left rectus sheath hematoma.  No acute bleeding reported.  Hgb 13 on admit>>11.3>11.7 stable  Pltc stable at 402k.   SCr 0.84 stable 5/19  Goal of Therapy:  Heparin level 0.3-0.7 units/ml Monitor platelets by anticoagulation protocol: Yes   Plan:  Continue Heparin infusion 1450 units/hr  Daily HL and CBC. Monitor for s/sx of bleeding  Nicole Cella, [place] Clinical  Pharmacist [phone] Please see amion for complete clinical pharmacist phone list/  [phone] after 10PM 08/07/2021 10:56 AM

## 2021-08-07 NOTE — Consult Note (Signed)
Chief Complaint: Patient was seen in consultation today for liver lesion biopsy.  Referring Physician(s): Terrilee Croak, MD  Supervising Physician: Corrie Mckusick  Patient Status: [facility] - In-pt  History of Present Illness: Denise [name] is a 71 y.o. female with a past medical history significant for  recent PE (admitted 4/30-07/22/21), DM, HTN, aortic stenosis, SBO, lung cancer with brain metastasis who presented to [facility] ED 5/17 with complaints of LLQ pain x 1 day. CT abd/pelvis w/contrast showed:  1. Large LEFT rectus sheath hematoma extending to the symphysis pubis on the LEFT compatible with intramuscular hematoma. No discrete site of extravasation but with mixed attenuation, a finding that can be seen in the setting of ongoing bleeding. Hematoma measuring 12 cm greatest dimension. 2. Suspected new hepatic metastatic disease and potential lesion as large as 3.7 cm in the RIGHT hepatic lobe. Follow-up liver MRI with and without Eovist contrast may be helpful. At this time increasing biliary duct distension could also be evaluated. This would be best performed as an outpatient when the patient is better able to hold their breath and cooperate with this examination, at outside of the acute setting. 3. Post gastric bypass. 4. Nonobstructing large umbilical hernia containing small bowel loops.  General surgery was consulted by EDP and recommendations were given for medicine admission, abdominal binder and trend hgb.   MRI abdomen w/ and w/o contrast was obtained 5/19 per the recommendation of patient's oncologist (Dr. Delton Coombes) which showed:  Bilobed mass versus 2 adjacent masses in the right hepatic lobe, highly suspicious for liver metastases.   Several tiny sub-cm lesions are too small to characterize, but some show evidence of mild peripheral rim enhancement raising suspicion for additional tiny liver metastases.   Prior cholecystectomy. Stable diffuse biliary ductal  dilatation, without evidence of choledocholithiasis or other obstructing etiology. Ampullary stenosis cannot be excluded by imaging.  Oncology has recommended liver lesion biopsy during this admission to further direct care. IR has been consulted for same -- imaging reviewed by Dr. Maryelizabeth Kaufmann who approves procedure.  Past Medical History:  Diagnosis Date   Anemia    Aortic stenosis    Arthritis    Cancer ([place])    Lung   Coronary artery calcification seen on CT scan    Essential hypertension    GERD (gastroesophageal reflux disease)    History of lung cancer    Stage III adenocarcinoma status post chemoradiation   Port-A-Cath in place 01/06/2019   Type 2 diabetes mellitus [facility])     Past Surgical History:  Procedure Laterality Date   CHOLECYSTECTOMY  1997   COLONOSCOPY     GASTRIC BYPASS     INCISIONAL HERNIA REPAIR  04/11/11   IR IMAGING GUIDED PORT INSERTION  12/27/2018   Right   LAPAROSCOPIC SALPINGOOPHERECTOMY     LAPAROTOMY  04/11/2011   Procedure: EXPLORATORY LAPAROTOMY;  Surgeon: Joyice Faster. Cornett, MD;  Location: [facility];  Service: General;  Laterality: N/A;  closure port hole   RIGHT/LEFT HEART CATH AND CORONARY ANGIOGRAPHY N/A 12/29/2019   Procedure: RIGHT/LEFT HEART CATH AND CORONARY ANGIOGRAPHY;  Surgeon: Troy Sine, MD;  Location: [facility];  Service: Cardiovascular;  Laterality: N/A;   TOTAL HIP ARTHROPLASTY  03/07/2012   Procedure: TOTAL HIP ARTHROPLASTY ANTERIOR APPROACH;  Surgeon: Mauri Pole, MD;  Location: [facility];  Service: Orthopedics;  Laterality: Right;   TOTAL SHOULDER ARTHROPLASTY Left 01/21/2015   TOTAL SHOULDER ARTHROPLASTY Left 01/21/2015   Procedure: LEFT TOTAL SHOULDER ARTHROPLASTY;  Surgeon:  Justice Britain, MD;  Location: [facility];  Service: Orthopedics;  Laterality: Left;   VAGINAL HYSTERECTOMY      Allergies: Patient has no known allergies.  Medications: Prior to Admission medications   Medication Sig Start Date End Date Taking?  Authorizing Provider  albuterol (VENTOLIN HFA) 108 (90 Base) MCG/ACT inhaler TAKE 2 PUFFS BY MOUTH EVERY 6 HOURS AS NEEDED FOR WHEEZE OR SHORTNESS OF BREATH Patient taking differently: Inhale 2 puffs into the lungs every 6 (six) hours as needed for wheezing or shortness of breath. 07/27/20  Yes Derek Jack, MD  amLODipine (NORVASC) 5 MG tablet Take 5 mg by mouth daily.    Yes [provider]  carvedilol (COREG) 3.125 MG tablet Take 1 tablet (3.125 mg total) by mouth 2 (two) times daily. 07/08/21  Yes Satira Sark, MD  Cyanocobalamin (VITAMIN B-12) 2500 MCG SUBL Place 2,500 mcg under the tongue every morning.    Yes [provider]  diphenhydrAMINE (BENADRYL) 25 MG tablet Take 25 mg by mouth every 6 (six) hours as needed for itching or allergies.   Yes [provider]  enoxaparin (LOVENOX) 120 MG/0.8ML injection Inject 0.8 mLs (120 mg total) into the skin every 12 (twelve) hours. 07/22/21 10/20/21 Yes Dessa Phi, DO  ferrous sulfate 325 (65 FE) MG tablet Take 325 mg by mouth every evening.   Yes [provider]  furosemide (LASIX) 20 MG tablet Take 20 mg by mouth daily.   Yes [provider]  gabapentin (NEURONTIN) 300 MG capsule Take 300 mg by mouth at bedtime as needed (Nerve Pain).   Yes [provider]  HYDROcodone-acetaminophen (NORCO/VICODIN) 5-325 MG tablet Take 1 tablet by mouth every 8 (eight) hours as needed. for pain 07/26/21  Yes [provider]  Multiple Vitamin (MULITIVITAMIN WITH MINERALS) TABS Take 1 tablet by mouth daily.   Yes [provider]  omeprazole (PRILOSEC) 20 MG capsule Take 20 mg by mouth daily. 09/28/18  Yes [provider]  Potassium Chloride ER 20 MEQ TBCR Take 1 tablet by mouth daily. 07/26/21  Yes [provider]  Calcium Carbonate Antacid (TUMS PO) Take 4 tablets by mouth daily.    [provider]  prochlorperazine (COMPAZINE) 10 MG tablet Take 1 tablet (10 mg  total) by mouth every 6 (six) hours as needed (Nausea or vomiting). 01/06/19 05/29/19  Derek Jack, MD     Family History  Problem Relation Age of Onset   Breast cancer Mother    COPD Mother    Arthritis Mother    Diabetes Mother    Hypertension Mother    Hypertension Father    Diabetes Father    Breast cancer Sister    Thyroid cancer Brother    Huntington's disease Maternal Grandmother    Heart attack Brother     Social History   Socioeconomic History   Marital status: Single    Spouse name: Not on file   Number of children: Not on file   Years of education: 12th grade   Highest education level: Not on file  Occupational History   Occupation: Employed    Employer: [employer]  Tobacco Use   Smoking status: Former    Packs/day: 1.00    Years: 12.00    Pack years: 12.00    Types: Cigarettes    Quit date: 03/20/1976    Years since quitting: 45.4   Smokeless tobacco: Never  Vaping Use   Vaping Use: Never used  Substance and Sexual Activity  Alcohol use: No   Drug use: No   Sexual activity: Not Currently  Other Topics Concern   Not on file  Social History Narrative   Not on file   Social Determinants of Health   Financial Resource Strain: Not on file  Food Insecurity: Not on file  Transportation Needs: Not on file  Physical Activity: Not on file  Stress: Not on file  Social Connections: Not on file     Review of Systems: A 12 point ROS discussed and pertinent positives are indicated in the HPI above.  All other systems are negative.  Review of Systems  Constitutional:  Negative for chills and fever.  Respiratory:  Negative for cough and shortness of breath.   Cardiovascular:  Negative for chest pain.  Gastrointestinal:  Positive for abdominal pain (LLQ mostly, mild now). Negative for diarrhea, nausea and vomiting.  Musculoskeletal:  Negative for back pain.  Neurological:  Negative for dizziness and headaches.   Vital Signs: BP (!)  111/59   Pulse 89   Temp 97.8 F (36.6 C) (Oral)   Resp 16   Ht 5\' 2"  (1.575 m)   Wt 257 lb 4.4 oz (116.7 kg)   SpO2 100%   BMI 47.06 kg/m   Physical Exam Vitals and nursing note reviewed.  HENT:     Head: Normocephalic.     Mouth/Throat:     Mouth: Mucous membranes are moist.     Pharynx: Oropharynx is clear. No oropharyngeal exudate or posterior oropharyngeal erythema.  Cardiovascular:     Rate and Rhythm: Normal rate and regular rhythm.  Pulmonary:     Effort: Pulmonary effort is normal.     Breath sounds: Normal breath sounds.  Abdominal:     General: There is no distension.     Palpations: Abdomen is soft.     Tenderness: There is no abdominal tenderness.  Skin:    General: Skin is warm and dry.  Neurological:     Mental Status: She is alert and oriented to person, place, and time.  Psychiatric:        Mood and Affect: Mood normal.        Behavior: Behavior normal.        Thought Content: Thought content normal.        Judgment: Judgment normal.     MD Evaluation Airway: WNL Heart: WNL Abdomen: WNL Chest/ Lungs: WNL ASA  Classification: 3 Mallampati/Airway Score: Two   Imaging: CT Chest W Contrast  Result Date: 07/17/2021 CLINICAL DATA:  A 71 year old female presents with history of non-small cell lung cancer. Shortness of breath on exertion for the past few days. Insert body on EXAM: CT CHEST WITH CONTRAST TECHNIQUE: Multidetector CT imaging of the chest was performed during intravenous contrast administration. RADIATION DOSE REDUCTION: This exam was performed according to the departmental dose-optimization program which includes automated exposure control, adjustment of the mA and/or kV according to patient size and/or use of iterative reconstruction technique. CONTRAST:  51mL OMNIPAQUE IOHEXOL 300 MG/ML  SOLN COMPARISON:  April 21, 2021. FINDINGS: Cardiovascular: Increased RV to LV ratio at 1.8.  No pericardial effusion. Aortic caliber is normal with signs  of calcified and noncalcified aortic atherosclerotic plaque. Central pulmonary arteries are normal caliber opacified 195 Hounsfield units. Occlusive thrombi are present in segmental branches of the RIGHT lower and anterior RIGHT upper lobe. Nonocclusive embolus in the RIGHT lower lobe and RIGHT middle lobe lobar level branches and in RIGHT upper lobe branches. No large thrombus in  the main pulmonary artery on the LEFT or RIGHT. Perhaps small emboli also in the LEFT lower lobe at the segmental level. Mediastinum/Nodes: No mediastinal adenopathy. No thoracic inlet adenopathy. No axillary lymphadenopathy. No hilar lymphadenopathy. RIGHT-sided Port-A-Cath terminates at the distal aspect of the superior vena cava. Lungs/Pleura: Patchy, subtle ground-glass is present scattered throughout the chest more pronounced in the RIGHT chest. Discrete subsolid nodule measuring 11 x 9 mm. (Image 71/4) in the superior segment of the RIGHT lower lobe. Stable appearance of consolidative post treatment changes in the LEFT chest in the LEFT mid to upper chest extending from the LEFT hilum. No pleural effusion. No pneumothorax. Upper Abdomen: Hypodense lesion in the RIGHT hepatic lobe appears more conspicuous than on previous imaging from February of 2023 and appears new compared to remote imaging from December of 2021 measuring 20 mm (image 143/2). Additional small lesion seen just inferior to this may also be new. Subtle scattered foci of low attenuation are suggested elsewhere in the liver. Lobular hepatic contours and fissural widening. Stable low-density lesion in the medial segment of the LEFT hepatic lobe compatible with a cyst. Post cholecystectomy with stable biliary duct dilation both intra and extrahepatic. Liver is incompletely imaged. Imaged portions of pancreas, spleen, adrenal glands and kidneys without acute process. Post gastric bypass. No acute findings related to the gastrointestinal tract to the extent evaluated.  Musculoskeletal: No acute bone finding. No destructive bone process. Spinal degenerative changes. Post LEFT shoulder arthroplasty incompletely evaluated. IMPRESSION: 1. Positive for acute PE occlusive at the segmental level, nonocclusive at the lobar level in the RIGHT lower lobe with emboli also in the upper and middle lobe on the RIGHT. Above findings are associated with CT evidence of right heart strain (RV/LV Ratio = 1.8) consistent with at least submassive (intermediate risk) PE. The presence of right heart strain has been associated with an increased risk of morbidity and mortality. Please activate Code PE / PE Focused Oder set in [facility] for further guidance. 2. Scattered areas of ground-glass potentially related to subtle areas of infarct. Some areas of nodularity as outlined above that warrant attention on follow-up. 3. Suspected new areas of hepatic metastatic disease largest best seen on coronal image 82/5 measuring up to 2 cm in the RIGHT hepatic lobe. Consider MRI for further evaluation when the patient is able, preferably as an outpatient when they were able to cooperate for the examination and perform breath holding maneuvers. Aortic Atherosclerosis (ICD10-I70.0). Critical Value/emergent results were called by telephone at the time of interpretation on 07/17/2021 at 4:14 pm to provider Dr. Sabra Heck, Who verbally acknowledged these results. Electronically Signed   By: Zetta Bills M.D.   On: 07/17/2021 16:16   MR ABDOMEN W WO CONTRAST  Result Date: 08/05/2021 CLINICAL DATA:  Left abdominal pain. Liver lesion seen on recent CT. Lung cancer. EXAM: MRI ABDOMEN WITHOUT AND WITH CONTRAST TECHNIQUE: Multiplanar multisequence MR imaging of the abdomen was performed both before and after the administration of intravenous contrast. CONTRAST:  69mL GADAVIST GADOBUTROL 1 MMOL/ML IV SOLN COMPARISON:  CT on 08/03/2021 FINDINGS: Lower chest: No acute findings. Hepatobiliary: A bilobed mass versus 2 adjacent masses  are seen in the right hepatic lobe which measure 3.7 x 3.7 cm and 4.3 x 2.5 cm. These show areas of central necrosis and are highly suspicious for liver metastases. A benign appearing cyst is seen in segment 3 of the left lobe. In addition, there are other tiny sub-cm lesions which are difficult to characterize due to  their small size, but show evidence of mild peripheral rim enhancement. Early liver metastases cannot be excluded. Prior cholecystectomy again noted. Diffuse biliary ductal dilatation remains stable, and there is no evidence of choledocholithiasis. Pancreas:  No mass or inflammatory changes. Spleen:  Within normal limits in size and appearance. Adrenals/Urinary Tract: No masses identified. No evidence of hydronephrosis. Stomach/Bowel: Colonic diverticulosis noted, without evidence of diverticulitis. Vascular/Lymphatic: No pathologically enlarged lymph nodes identified. No acute vascular findings. Other:  None. Musculoskeletal:  No suspicious bone lesions identified. IMPRESSION: Bilobed mass versus 2 adjacent masses in the right hepatic lobe, highly suspicious for liver metastases. Several tiny sub-cm lesions are too small to characterize, but some show evidence of mild peripheral rim enhancement raising suspicion for additional tiny liver metastases. Prior cholecystectomy. Stable diffuse biliary ductal dilatation, without evidence of choledocholithiasis or other obstructing etiology. Ampullary stenosis cannot be excluded by imaging. Electronically Signed   By: Marlaine Hind M.D.   On: 08/05/2021 11:41   CT ABDOMEN PELVIS W CONTRAST  Result Date: 08/03/2021 CLINICAL DATA:  A 71 year old female presents for evaluation of LEFT lower quadrant abdominal pain. Pain radiates to the groin and began yesterday. * Tracking Code: BO * w EXAM: CT ABDOMEN AND PELVIS WITH CONTRAST TECHNIQUE: Multidetector CT imaging of the abdomen and pelvis was performed using the standard protocol following bolus administration  of intravenous contrast. RADIATION DOSE REDUCTION: This exam was performed according to the departmental dose-optimization program which includes automated exposure control, adjustment of the mA and/or kV according to patient size and/or use of iterative reconstruction technique. CONTRAST:  151mL OMNIPAQUE IOHEXOL 300 MG/ML  SOLN COMPARISON:  April 11, 2011.  July 17, 2021 chest CT. FINDINGS: Lower chest: Lung bases are clear aside from mild atelectatic changes. Hepatobiliary: Marked dilation of the biliary tree 16 mm caliber of the common bile duct increased from 12-13 mm in January of 2023. Intrahepatic biliary duct distension also present. Lesion as large as 3.7 x 3.4 cm in the RIGHT hepatic lobe inferior to a 17 mm lesion in hepatic subsegment V no additional hepatic lesions./VIII (image 19/2) Pancreas: Normal, without mass, inflammation or ductal dilatation. Spleen: Normal. Adrenals/Urinary Tract: Adrenal glands are normal. Symmetric renal enhancement without hydronephrosis. No perivesical stranding. No suspicious renal lesion. Stomach/Bowel: Post gastric bypass. No small bowel obstruction. Small bowel loops contained in a ventral hernia with wide mouth. The appendix is normal. There is extensive colonic diverticulosis without acute colonic process. Vascular/Lymphatic: No adenopathy in the retroperitoneum. No adenopathy in the pelvis. Reproductive: Fibroid uterus densely calcified fibroid. Other: Large rectus sheath hematoma extending to the symphysis pubis on the LEFT compatible with intramuscular hematoma in the setting of ongoing anticoagulation. Expands the lower margin of the muscle. No discrete site of extravasation but with mixed attenuation, a finding that can be seen in the setting of ongoing bleeding. Hematoma measuring in the sagittal plane approximately 12.4 x 4.8 cm. Does not currently across the midline. Stranding about the hematoma. Musculoskeletal: No acute bone finding. No destructive bone  process. Spinal degenerative changes. Signs of RIGHT hip arthroplasty with resultant streak artifact. IMPRESSION: 1. Large LEFT rectus sheath hematoma extending to the symphysis pubis on the LEFT compatible with intramuscular hematoma. No discrete site of extravasation but with mixed attenuation, a finding that can be seen in the setting of ongoing bleeding. Hematoma measuring 12 cm greatest dimension. 2. Suspected new hepatic metastatic disease and potential lesion as large as 3.7 cm in the RIGHT hepatic lobe. Follow-up liver MRI with and  without Eovist contrast may be helpful. At this time increasing biliary duct distension could also be evaluated. This would be best performed as an outpatient when the patient is better able to hold their breath and cooperate with this examination, at outside of the acute setting. 3. Post gastric bypass. 4. Nonobstructing large umbilical hernia containing small bowel loops. Findings of large rectus sheath hematoma were called to the physician caring for the patient as outlined below. These results were called by telephone at the time of interpretation on 08/03/2021 at 5:05 pm to provider Dr. Sabra Heck, Who verbally acknowledged these results. Electronically Signed   By: Zetta Bills M.D.   On: 08/03/2021 17:06   US Venous Img Lower Bilateral (DVT)  Result Date: 07/18/2021 CLINICAL DATA:  71 year old female with lower extremity edema, pulmonary embolism. EXAM: BILATERAL LOWER EXTREMITY VENOUS DOPPLER ULTRASOUND TECHNIQUE: Gray-scale sonography with graded compression, as well as color Doppler and duplex ultrasound were performed to evaluate the lower extremity deep venous systems from the level of the common femoral vein and including the common femoral, femoral, profunda femoral, popliteal and calf veins including the posterior tibial, peroneal and gastrocnemius veins when visible. The superficial great saphenous vein was also interrogated. Spectral Doppler was utilized to  evaluate flow at rest and with distal augmentation maneuvers in the common femoral, femoral and popliteal veins. COMPARISON:  None. FINDINGS: RIGHT LOWER EXTREMITY Common Femoral Vein: No evidence of thrombus. Normal compressibility, respiratory phasicity and response to augmentation. Saphenofemoral Junction: No evidence of thrombus. Normal compressibility and flow on color Doppler imaging. Profunda Femoral Vein: No evidence of thrombus. Normal compressibility and flow on color Doppler imaging. Femoral Vein: No evidence of thrombus. Normal compressibility, respiratory phasicity and response to augmentation. Popliteal Vein: No evidence of thrombus. Normal compressibility, respiratory phasicity and response to augmentation. Calf Veins: No evidence of thrombus. Normal compressibility and flow on color Doppler imaging. Other Findings:  None. LEFT LOWER EXTREMITY Common Femoral Vein: No evidence of thrombus. Normal compressibility, respiratory phasicity and response to augmentation. Saphenofemoral Junction: No evidence of thrombus. Normal compressibility and flow on color Doppler imaging. Profunda Femoral Vein: No evidence of thrombus. Normal compressibility and flow on color Doppler imaging. Femoral Vein: No evidence of thrombus. Normal compressibility, respiratory phasicity and response to augmentation. Popliteal Vein: No evidence of thrombus. Normal compressibility, respiratory phasicity and response to augmentation. Calf Veins: No evidence of thrombus. Normal compressibility and flow on color Doppler imaging. Other Findings:  None. IMPRESSION: No evidence of bilateral lower extremity deep venous thrombosis. Ruthann Cancer, MD [facility] Electronically Signed   By: Ruthann Cancer M.D.   On: 07/18/2021 09:34   DG Chest Portable 1 View  Result Date: 07/17/2021 CLINICAL DATA:  Shortness of breath and hypoxia. EXAM: PORTABLE CHEST 1 VIEW COMPARISON:  11/19/2018  FINDINGS: 1305 hours. The cardio pericardial silhouette is enlarged. Confluent masslike lesion seen in the left mid lung previously is decreased in the interval with more patchy left lung opacity in this region today, consistent with post treatment scarring noted on multiple intervening chest CTs. Probable small volume retrocardiac atelectasis at the left base. No pulmonary edema. Right Port-A-Cath remains in place. Telemetry leads overlie the chest. IMPRESSION: Post treatment scarring left mid lung with probable retrocardiac atelectasis at the left base. Electronically Signed   By: Misty Stanley M.D.   On: 07/17/2021 13:30   ECHOCARDIOGRAM COMPLETE  Result Date: 07/18/2021    ECHOCARDIOGRAM REPORT   Patient Name:   [facility]  Date of Exam: 07/18/2021 Medical Rec #:  [id number]   Height:       62.0 in Accession #:    [id number]  Weight:       259.5 lb Date of Birth:  11-30-1950    BSA:          2.135 m Patient Age:    30 years    BP:           107/65 mmHg Patient Gender: F           HR:           76 bpm. Exam Location:  [facility] Procedure: 2D Echo, Color Doppler and Cardiac Doppler Indications:    Dyspnea  History:        Patient has prior history of Echocardiogram examinations, most                 recent 08/03/2020. Risk Factors:Diabetes, Hypertension and Former                 Smoker. Pulmonary Embolus; Lung Cancer.  Sonographer:    Leavy Cella RDCS Referring Phys: [id number] [place]  1. Left ventricular ejection fraction, by estimation, is 65 to 70%. The left ventricle has normal function. Left ventricular endocardial border not optimally defined to evaluate regional wall motion. There is severe left ventricular hypertrophy. Left ventricular diastolic parameters are consistent with Grade I diastolic dysfunction (impaired relaxation).  2. Ventricular septum is flattened in systole consistent with RV pressure overload. . Right ventricular systolic function moderately to severely  reduced. The right ventricular size is severely enlarged. There is severely elevated pulmonary artery systolic pressure.  3. Right atrial size was severely dilated.  4. The mitral valve was not well visualized. No evidence of mitral valve regurgitation. No evidence of mitral stenosis.  5. The tricuspid valve is abnormal. Tricuspid valve regurgitation is moderate.  6. The aortic valve has an indeterminant number of cusps. There is moderate calcification of the aortic valve. There is moderate thickening of the aortic valve. Aortic valve regurgitation is mild. Moderate aortic valve stenosis. Aortic valve mean gradient measures 28.2 mmHg. Aortic valve peak gradient measures 46.9 mmHg. Aortic valve area, by VTI measures 1.25 cm.  7. The inferior vena cava is normal in size with greater than 50% respiratory variability, suggesting right atrial pressure of 3 mmHg. FINDINGS  Left Ventricle: Left ventricular ejection fraction, by estimation, is 65 to 70%. The left ventricle has normal function. Left ventricular endocardial border not optimally defined to evaluate regional wall motion. The left ventricular internal cavity size was normal in size. There is severe left ventricular hypertrophy. Left ventricular diastolic parameters are consistent with Grade I diastolic dysfunction (impaired relaxation). Normal left ventricular filling pressure. Right Ventricle: Ventricular septum is flattened in systole consistent with RV pressure overload. The right ventricular size is severely enlarged. Right vetricular wall thickness was not well visualized. Right ventricular systolic function moderately to severely reduced. There is severely elevated pulmonary artery systolic pressure. The tricuspid regurgitant velocity is 4.13 m/s, and with an assumed right atrial pressure of 3 mmHg, the estimated right ventricular systolic pressure is 16.0 mmHg. Left Atrium: Left atrial size was normal in size. Right Atrium: Right atrial size was severely  dilated. Pericardium: There is no evidence of pericardial effusion. Mitral Valve: The mitral valve was not well visualized. There is mild thickening of the mitral valve leaflet(s). There is mild calcification of the mitral valve leaflet(s). Mild mitral annular calcification.  No evidence of mitral valve regurgitation. No evidence of mitral valve stenosis. Tricuspid Valve: The tricuspid valve is abnormal. Tricuspid valve regurgitation is moderate . No evidence of tricuspid stenosis. Aortic Valve: The aortic valve has an indeterminant number of cusps. There is moderate calcification of the aortic valve. There is moderate thickening of the aortic valve. There is moderate aortic valve annular calcification. Aortic valve regurgitation is mild. Aortic regurgitation PHT measures 715 msec. Moderate aortic stenosis is present. Aortic valve mean gradient measures 28.2 mmHg. Aortic valve peak gradient measures 46.9 mmHg. Aortic valve area, by VTI measures 1.25 cm. Pulmonic Valve: The pulmonic valve was not well visualized. Pulmonic valve regurgitation is not visualized. No evidence of pulmonic stenosis. Aorta: The aortic root is normal in size and structure. Venous: The inferior vena cava is normal in size with greater than 50% respiratory variability, suggesting right atrial pressure of 3 mmHg. IAS/Shunts: No atrial level shunt detected by color flow Doppler.  LEFT VENTRICLE PLAX 2D LVIDd:         3.60 cm   Diastology LVIDs:         2.10 cm   LV e' medial:    5.00 cm/s LV PW:         1.60 cm   LV E/e' medial:  14.9 LV IVS:        1.40 cm   LV e' lateral:   6.20 cm/s LVOT diam:     1.90 cm   LV E/e' lateral: 12.0 LV SV:         80 LV SV Index:   38 LVOT Area:     2.84 cm  RIGHT VENTRICLE RV Basal diam:  5.70 cm RV Mid diam:    4.40 cm RV S prime:     9.57 cm/s TAPSE (M-mode): 2.0 cm LEFT ATRIUM             Index        RIGHT ATRIUM           Index LA diam:        3.00 cm 1.40 cm/m   RA Area:     23.10 cm LA Vol (A2C):    56.0 ml 26.22 ml/m  RA Volume:   93.90 ml  43.97 ml/m LA Vol (A4C):   36.7 ml 17.19 ml/m LA Biplane Vol: 48.0 ml 22.48 ml/m  AORTIC VALVE AV Area (Vmax):    1.01 cm AV Area (Vmean):   0.98 cm AV Area (VTI):     1.25 cm AV Vmax:           342.43 cm/s AV Vmean:          249.663 cm/s AV VTI:            0.643 m AV Peak Grad:      46.9 mmHg AV Mean Grad:      28.2 mmHg LVOT Vmax:         122.42 cm/s LVOT Vmean:        86.459 cm/s LVOT VTI:          0.283 m LVOT/AV VTI ratio: 0.44 AI PHT:            715 msec  AORTA Ao Root diam: 2.70 cm MITRAL VALVE               TRICUSPID VALVE MV Area (PHT): 2.81 cm    TR Peak grad:   68.2 mmHg MV Decel Time: 270 msec    TR Vmax:  413.00 cm/s MV E velocity: 74.70 cm/s MV A velocity: 96.10 cm/s  SHUNTS MV E/A ratio:  0.78        Systemic VTI:  0.28 m                            Systemic Diam: 1.90 cm Carlyle Dolly MD Electronically signed by Carlyle Dolly MD Signature Date/Time: 07/18/2021/4:39:22 PM    Final     Labs:  CBC: Recent Labs    08/04/21 0303 08/05/21 0359 08/06/21 0927 08/07/21 0818  WBC 7.6 7.6 7.7 7.4  HGB 11.9* 11.3* 11.3* 11.7*  HCT 37.1 35.6* 34.9* 36.7  PLT 294 310 342 402*    COAGS: No results for input(s): INR, APTT in the last 8760 hours.  BMP: Recent Labs    07/20/21 0541 08/03/21 1404 08/04/21 0303 08/05/21 0359  NA 144 144 140 141  K 3.7 4.5 5.3* 3.6  CL 113* 107 107 106  CO2 24 27 23 26   GLUCOSE 93 123* 131* 125*  BUN 11 8 7* 5*  CALCIUM 8.7* 9.5 8.8* 8.6*  CREATININE 0.86 0.83 0.85 0.84  GFRNONAA >60 >60 >60 >60    LIVER FUNCTION TESTS: Recent Labs    12/15/20 0805 04/21/21 0958 07/17/21 1320 08/03/21 1404  BILITOT 0.3 0.2* 0.4 0.3  AST 18 20 29 17   ALT 17 19 45* 21  ALKPHOS 93 102 93 83  PROT 6.8 6.5 6.6 7.3  ALBUMIN 3.7 3.4* 3.7 3.9    TUMOR MARKERS: No results for input(s): AFPTM, CEA, CA199, CHROMGRNA in the last 8760 hours.  Assessment and Plan:  71 y/o F with history of lung cancer  with brain metastasis admitted 5/18 with LLQ pain found to have large left rectus sheath hematoma, no surgical intervention recommended. Patient admitted for PE 4/30-5/5 and was placed on lovenox at d/c. Lovenox has been held due to concern for ongoing bleeding, currently on heparin gtt.  CT abd/pelvis also showed liver lesion concerning for metastatic disease, follow up MRI abdomen showed 1 bilobed vs possibly 2 adjacent masses in the right hepatic lobe highly suspicious for liver metastases.   IR has been consulted for liver lesion biopsy -- patient history and imaging reviewed by Dr. Maryelizabeth Kaufmann who approves procedure. Will plan on biopsy 5/22 pending IR schedule availability -- patient to be NPO at midnight 5/22 (order placed), heparin gtt will need to be held~4 hours which IR RN will coordinate with floor RN once time of procedure is known.  Risks and benefits of liver lesion biopsy was discussed with the patient and/or patient's family including, but not limited to bleeding, infection, damage to adjacent structures or low yield requiring additional tests.  All of the questions were answered and there is agreement to proceed.  Consent signed and in chart.   Thank you for this interesting consult.  I greatly enjoyed meeting Denise [name] and look forward to participating in their care.  A copy of this report was sent to the requesting provider on this date.  Electronically Signed: Joaquim Nam, PA-C 08/07/2021, 12:32 PM   I spent a total of 40 Minutes in face to face in clinical consultation, greater than 50% of which was counseling/coordinating care for liver lesion biopsy.

## 2021-08-08 ENCOUNTER — Inpatient Hospital Stay (HOSPITAL_COMMUNITY): Payer: Medicare HMO

## 2021-08-08 DIAGNOSIS — T148XXA Other injury of unspecified body region, initial encounter: Secondary | ICD-10-CM | POA: Diagnosis not present

## 2021-08-08 HISTORY — PX: IR US GUIDE BX ASP/DRAIN: IMG2392

## 2021-08-08 LAB — PROTIME-INR
INR: 1.1 (ref 0.8–1.2)
Prothrombin Time: 14.2 seconds (ref 11.4–15.2)

## 2021-08-08 LAB — GLUCOSE, CAPILLARY
Glucose-Capillary: 101 mg/dL — ABNORMAL HIGH (ref 70–99)
Glucose-Capillary: 97 mg/dL (ref 70–99)
Glucose-Capillary: 83 mg/dL (ref 70–99)

## 2021-08-08 LAB — CBC
HCT: 34.2 % — ABNORMAL LOW (ref 36.0–46.0)
Hemoglobin: 10.9 g/dL — ABNORMAL LOW (ref 12.0–15.0)
MCH: 27.5 pg (ref 26.0–34.0)
MCHC: 31.9 g/dL (ref 30.0–36.0)
MCV: 86.4 fL (ref 80.0–100.0)
Platelets: 385 10*3/uL (ref 150–400)
RBC: 3.96 MIL/uL (ref 3.87–5.11)
RDW: 14.9 % (ref 11.5–15.5)
WBC: 6.4 10*3/uL (ref 4.0–10.5)
nRBC: 0 % (ref 0.0–0.2)

## 2021-08-08 LAB — BASIC METABOLIC PANEL
Anion gap: 7 (ref 5–15)
BUN: 9 mg/dL (ref 8–23)
CO2: 24 mmol/L (ref 22–32)
Calcium: 8.7 mg/dL — ABNORMAL LOW (ref 8.9–10.3)
Chloride: 110 mmol/L (ref 98–111)
Creatinine, Ser: 0.92 mg/dL (ref 0.44–1.00)
GFR, Estimated: >60 mL/min (ref >60)
Glucose, Bld: 107 mg/dL — ABNORMAL HIGH (ref 70–99)
Potassium: 3.5 mmol/L (ref 3.5–5.1)
Sodium: 141 mmol/L (ref 135–145)

## 2021-08-08 LAB — HEPARIN LEVEL (UNFRACTIONATED): Heparin Unfractionated: 0.39 IU/mL (ref 0.30–0.70)

## 2021-08-08 IMAGING — US IR US GUIDANCE
1 series · 6 of 6 positions shown · non-contrast
Comparison: none

INDICATION: Multiple right liver masses

[Series 1: ir us guidance · 6 of 6 slices shown]
[im 1/6]
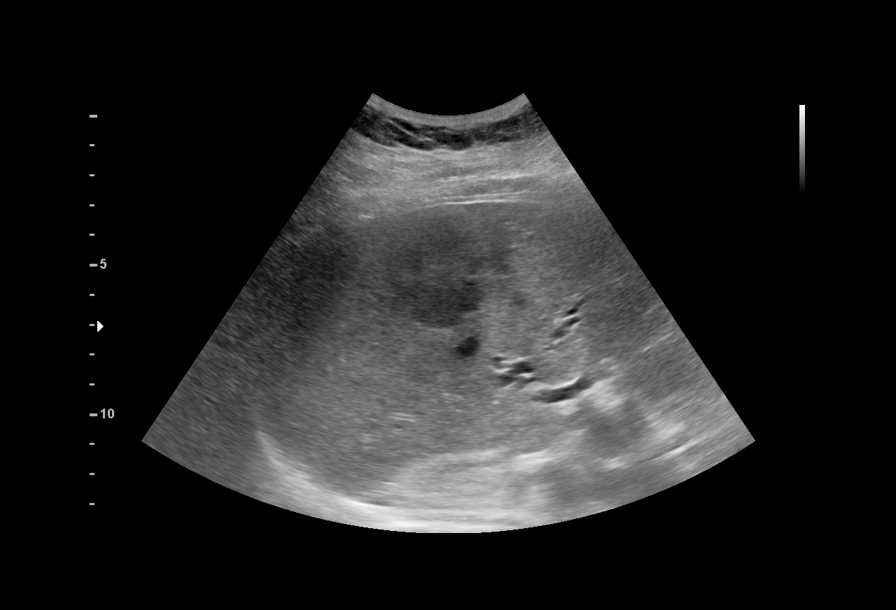
[im 2/6]
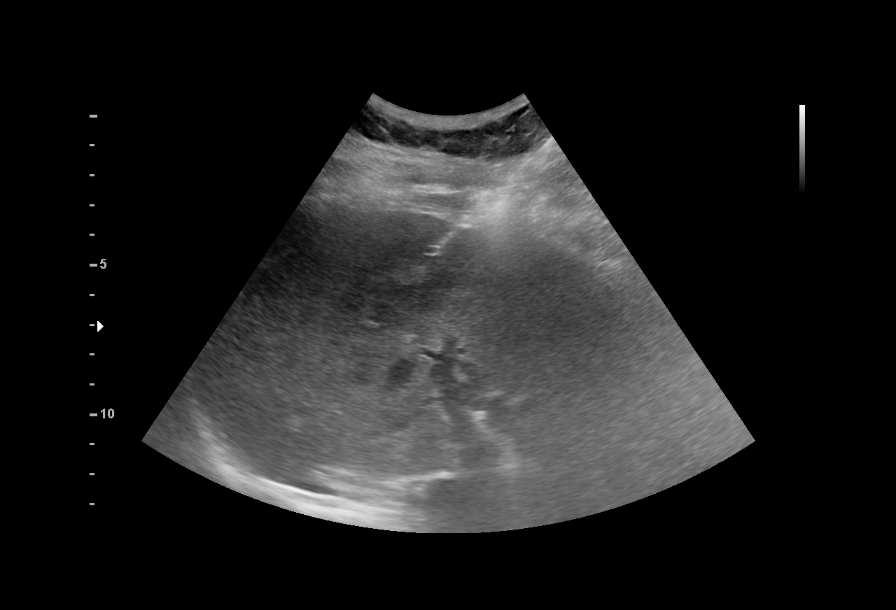
[im 3/6]
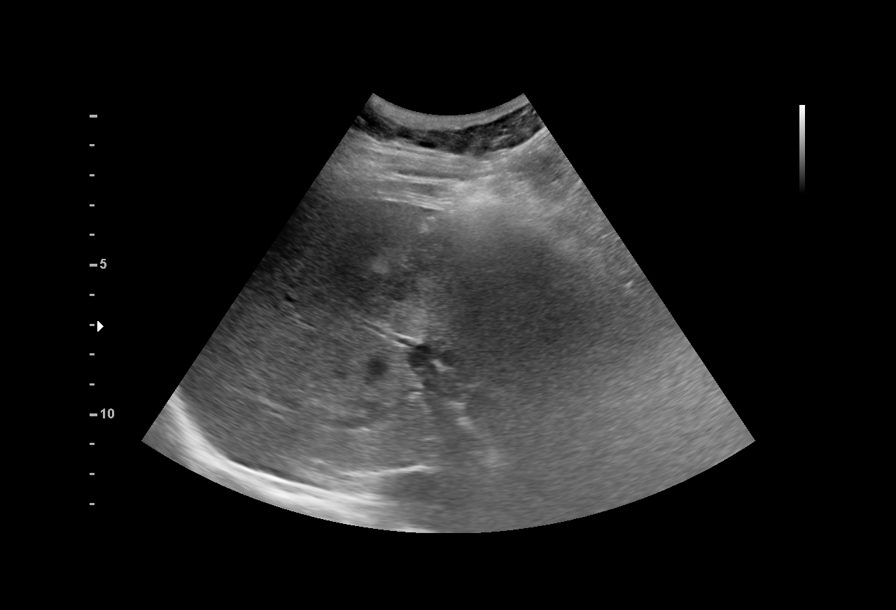
[im 4/6]
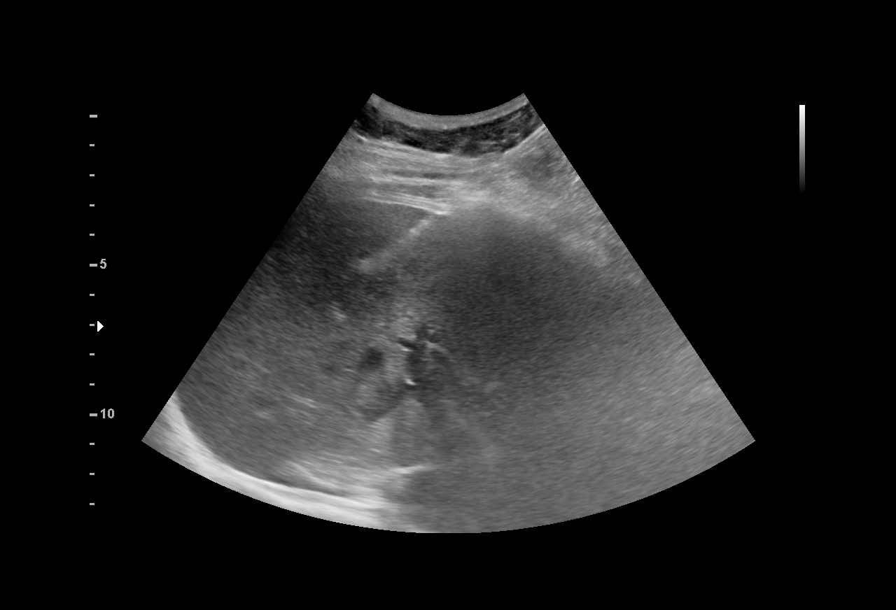
[im 5/6]
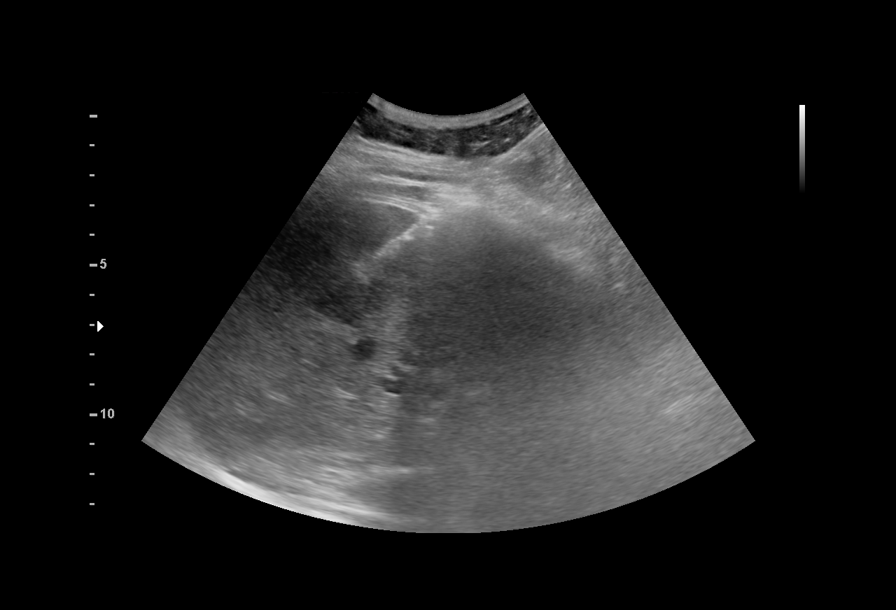
[im 6/6]
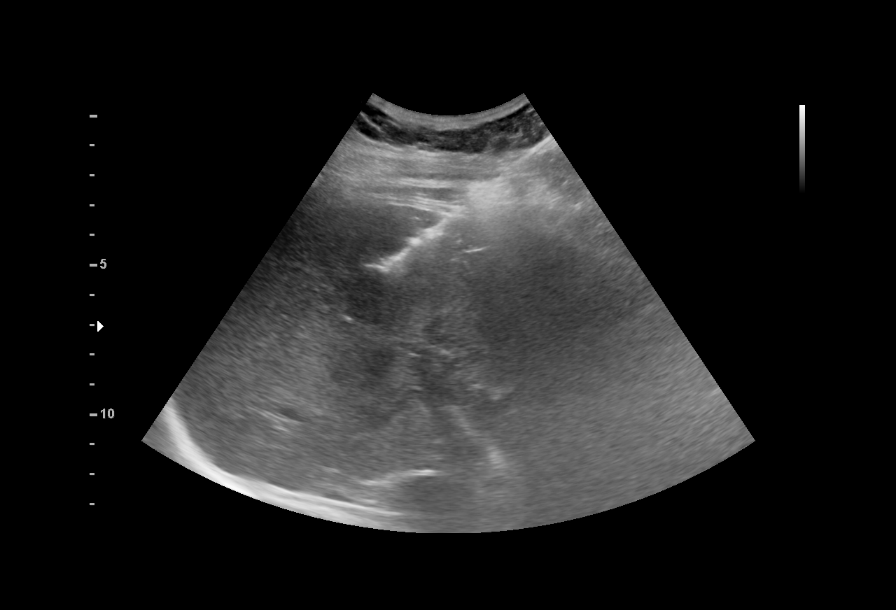

[6 of 6 positions shown; findings below may reference images not displayed]

EXAM:
Ultrasound-guided biopsy of right liver mass

MEDICATIONS:
None.

ANESTHESIA/SEDATION:
Moderate (conscious) sedation was employed during this procedure. A
total of Versed 1.5 mg and Fentanyl 50 mcg was administered
intravenously by the radiology nurse.

Total intra-service moderate Sedation Time: 8 minutes. The patient's
level of consciousness and vital signs were monitored continuously
by radiology nursing throughout the procedure under my direct
supervision.

COMPLICATIONS:
None immediate.

PROCEDURE:
Informed written consent was obtained from the patient after a
thorough discussion of the procedural risks, benefits and
alternatives. All questions were addressed. Maximal Sterile Barrier
Technique was utilized including caps, mask, sterile gowns, sterile
gloves, sterile drape, hand hygiene and skin antiseptic. A timeout
was performed prior to the initiation of the procedure.

Patient position supine on the ultrasound table.

Right upper quadrant skin prepped and draped in usual sterile
fashion.

Following local lidocaine administration, 17 gauge introducer needle
was advanced into 1 of the right hepatic lesions, and 4- 18 gauge
cores were obtained utilizing continuous ultrasound guidance.

Gelfoam slurry was administered through the introducer needle at the
biopsy site.

Samples were sent to pathology in formalin.

Needle removed and hemostasis achieved with 5 minutes of manual
compression.

Post procedure ultrasound images showed no evidence of significant
hemorrhage.
IMPRESSION: Ultrasound-guided biopsy of right liver mass.

## 2021-08-08 MED ORDER — MIDAZOLAM HCL 2 MG/2ML IJ SOLN
INTRAMUSCULAR | Status: AC
  Filled 2021-08-08: qty 2

## 2021-08-08 MED ORDER — MIDAZOLAM HCL 2 MG/2ML IJ SOLN
INTRAMUSCULAR | Status: AC | PRN
  Administered 2021-08-08: 1 mg via INTRAVENOUS
  Administered 2021-08-08: .5 mg via INTRAVENOUS

## 2021-08-08 MED ORDER — GELATIN ABSORBABLE 12-7 MM EX MISC
CUTANEOUS | Status: AC
  Filled 2021-08-08: qty 1

## 2021-08-08 MED ORDER — FENTANYL CITRATE (PF) 100 MCG/2ML IJ SOLN
INTRAMUSCULAR | Status: AC
  Filled 2021-08-08: qty 2

## 2021-08-08 MED ORDER — FENTANYL CITRATE (PF) 100 MCG/2ML IJ SOLN
INTRAMUSCULAR | Status: AC | PRN
  Administered 2021-08-08 (×2): 25 ug via INTRAVENOUS

## 2021-08-08 MED ORDER — HEPARIN (PORCINE) 25000 UT/250ML-% IV SOLN
1450.0000 [IU]/h | INTRAVENOUS | Status: DC
  Administered 2021-08-08 – 2021-08-09 (×2): 1450 [IU]/h via INTRAVENOUS
  Filled 2021-08-08: qty 250

## 2021-08-08 MED ORDER — LIDOCAINE HCL 1 % IJ SOLN
INTRAMUSCULAR | Status: AC
  Filled 2021-08-08: qty 20

## 2021-08-08 NOTE — Progress Notes (Signed)
Occupational Therapy Treatment Patient Details Name: Denise [name] MRN: [id number] DOB: 02-Jun-1950 Today's Date: 08/08/2021   History of present illness Pt is a 71 y/o female admitted secondary to LLQ pain. Imaging showed Large left  rectus sheath hematoma extending to the symphysis  pubis on the Left compatible with intramuscular hematoma. Pt with recent admission for PE and started on anticoagulation. PMH includes lung cancer, DM, and HTN.   OT comments  Patient received in recliner and agreeable to OT treatment. Patient was educated on reacher and sock aide use for LB dressing and provided demonstration. Patient was able to return demonstration with verbal cues and min assist. Patient performed toilet transfer with RW and min guard and able to perform own hygiene seated. Patient stood at sink for hand hygiene. Patient continues to have complaints of groin pain but is making good gains. Acute OT to continue to follow.    Recommendations for follow up therapy are one component of a multi-disciplinary discharge planning process, led by the attending physician.  Recommendations may be updated based on patient status, additional functional criteria and insurance authorization.    Follow Up Recommendations  Home health OT    Assistance Recommended at Discharge Intermittent Supervision/Assistance  Patient can return home with the following  A little help with walking and/or transfers;A lot of help with bathing/dressing/bathroom;Assistance with cooking/housework   Equipment Recommendations  Other (comment);BSC/3in1 (RW)    Recommendations for Other Services      Precautions / Restrictions Precautions Precautions: Fall Required Braces or Orthoses: Other Brace Other Brace: abdominal binder Restrictions Weight Bearing Restrictions: No       Mobility Bed Mobility Overal bed mobility: Needs Assistance             General bed mobility comments: up in recliner    Transfers Overall  transfer level: Needs assistance Equipment used: Rolling walker (2 wheels) Transfers: Sit to/from Stand Sit to Stand: Min guard           General transfer comment: min guard with verbal cues for hand placement     Balance Overall balance assessment: Needs assistance Sitting-balance support: Feet supported Sitting balance-Leahy Scale: Fair Sitting balance - Comments: seated in chair   Standing balance support: Bilateral upper extremity supported, No upper extremity supported, During functional activity Standing balance-Leahy Scale: Poor Standing balance comment: stood at sink for hand hygiene                           ADL either performed or assessed with clinical judgement   ADL Overall ADL's : Needs assistance/impaired     Grooming: Supervision/safety;Standing Grooming Details (indicate cue type and reason): at sink             Lower Body Dressing: Minimal assistance;Sitting/lateral leans Lower Body Dressing Details (indicate cue type and reason): AE training with reacher and sock aide Toilet Transfer: Min guard;Rolling walker (2 wheels);Regular Toilet   Toileting- Clothing Manipulation and Hygiene: Supervision/safety;Sitting/lateral lean         General ADL Comments: performed adaptive equipment training for LB dressing with patient demonstrating good understanding    Extremity/Trunk Assessment              Vision       Perception     Praxis      Cognition Arousal/Alertness: Awake/alert Behavior During Therapy: WFL for tasks assessed/performed Overall Cognitive Status: Within Functional Limits for tasks assessed  Exercises      Shoulder Instructions       General Comments      Pertinent Vitals/ Pain       Pain Assessment Pain Assessment: Faces Faces Pain Scale: Hurts even more Pain Location: groin area and right knee Pain Descriptors / Indicators: Grimacing,  Guarding, Moaning Pain Intervention(s): Limited activity within patient's tolerance, Monitored during session, Repositioned  Home Living                                          Prior Functioning/Environment              Frequency  Min 2X/week        Progress Toward Goals  OT Goals(current goals can now be found in the care plan section)  Progress towards OT goals: Progressing toward goals  Acute Rehab OT Goals Patient Stated Goal: get better OT Goal Formulation: With patient Time For Goal Achievement: 08/19/21 Potential to Achieve Goals: Good ADL Goals Pt Will Perform Lower Body Bathing: with modified independence;sitting/lateral leans;sit to/from stand;with adaptive equipment Pt Will Perform Lower Body Dressing: with modified independence;with adaptive equipment;sitting/lateral leans;sit to/from stand Pt Will Transfer to Toilet: with modified independence;ambulating Pt Will Perform Toileting - Clothing Manipulation and hygiene: with modified independence;sitting/lateral leans;sit to/from stand;with adaptive equipment  Plan Discharge plan remains appropriate    Co-evaluation                 AM-PAC OT "6 Clicks" Daily Activity     Outcome Measure   Help from another person eating meals?: None Help from another person taking care of personal grooming?: A Little Help from another person toileting, which includes using toliet, bedpan, or urinal?: A Little Help from another person bathing (including washing, rinsing, drying)?: A Lot Help from another person to put on and taking off regular upper body clothing?: None Help from another person to put on and taking off regular lower body clothing?: A Lot 6 Click Score: 18    End of Session Equipment Utilized During Treatment: Rolling walker (2 wheels)  OT Visit Diagnosis: Unsteadiness on feet (R26.81);Other abnormalities of gait and mobility (R26.89);Muscle weakness (generalized) (M62.81);Pain    Activity Tolerance Patient tolerated treatment well   Patient Left in chair;with call bell/phone within reach;with chair alarm set   Nurse Communication Mobility status        Time: 3329-5188 OT Time Calculation (min): 21 min  Charges: OT General Charges $OT Visit: 1 Visit OT Treatments $Self Care/Home Management : 8-22 mins  Lodema Hong, [place]  Pager [phone] Office [place] 08/08/2021, 10:02 AM

## 2021-08-08 NOTE — Sedation Documentation (Signed)
Vital signs stable. 

## 2021-08-08 NOTE — Progress Notes (Signed)
Physical Therapy Treatment Patient Details Name: Denise [name] MRN: [id number] DOB: 12-15-1950 Today's Date: 08/08/2021   History of Present Illness Pt is a 71 y/o female admitted secondary to LLQ pain. Imaging showed Large left  rectus sheath hematoma extending to the symphysis  pubis on the Left compatible with intramuscular hematoma. Pt with recent admission for PE and started on anticoagulation. PMH includes lung cancer, DM, and HTN.    PT Comments    Continuing work on functional mobility and activity tolerance;  Able to progress amb disatnce well today, and with good use of RW for steadiness; We discussed RW versus Rollator, and pt prefers regular RW (and this PT is in agreement)   Recommendations for follow up therapy are one component of a multi-disciplinary discharge planning process, led by the attending physician.  Recommendations may be updated based on patient status, additional functional criteria and insurance authorization.  Follow Up Recommendations  Home health PT     Assistance Recommended at Discharge Intermittent Supervision/Assistance  Patient can return home with the following A little help with bathing/dressing/bathroom;Assistance with cooking/housework;Assist for transportation   Equipment Recommendations  Rolling walker (2 wheels)    Recommendations for Other Services       Precautions / Restrictions Precautions Precautions: Fall Required Braces or Orthoses: Other Brace Other Brace: abdominal binder (Reports it rides up and is too constricting in the chair)     Mobility  Bed Mobility               General bed mobility comments: up in recliner    Transfers Overall transfer level: Needs assistance Equipment used: Rolling walker (2 wheels) Transfers: Sit to/from Stand Sit to Stand: Min guard           General transfer comment: min guard with verbal cues for hand placement    Ambulation/Gait Ambulation/Gait assistance: Min guard Gait  Distance (Feet): 110 Feet Assistive device: Rolling walker (2 wheels) Gait Pattern/deviations: Step-through pattern, Decreased weight shift to left, Antalgic Gait velocity: Decreased     General Gait Details: Cues to use RW to unweigh painful LEs and groin by pushing down into RW with hands; Pt was pleased with RW use, and requests one for home (currently uses a cane)   Stairs             Wheelchair Mobility    Modified Rankin (Stroke Patients Only)       Balance     Sitting balance-Leahy Scale: Fair       Standing balance-Leahy Scale: Poor                              Cognition Arousal/Alertness: Awake/alert Behavior During Therapy: WFL for tasks assessed/performed Overall Cognitive Status: Within Functional Limits for tasks assessed                                          Exercises      General Comments General comments (skin integrity, edema, etc.): Winded at end of walk on room air, but VSS      Pertinent Vitals/Pain Pain Assessment Pain Assessment: 0-10 Pain Score: 5  Pain Location: groin area and right knee Pain Descriptors / Indicators: Grimacing, Guarding, Moaning Pain Intervention(s): Monitored during session    Home Living  Prior Function            PT Goals (current goals can now be found in the care plan section) Acute Rehab PT Goals Patient Stated Goal: to decrease pain and go home PT Goal Formulation: With patient Time For Goal Achievement: 08/18/21 Potential to Achieve Goals: Good Progress towards PT goals: Progressing toward goals    Frequency    Min 3X/week      PT Plan Current plan remains appropriate    Co-evaluation              AM-PAC PT "6 Clicks" Mobility   Outcome Measure  Help needed turning from your back to your side while in a flat bed without using bedrails?: None Help needed moving from lying on your back to sitting on the side of a  flat bed without using bedrails?: A Little Help needed moving to and from a bed to a chair (including a wheelchair)?: A Little Help needed standing up from a chair using your arms (e.g., wheelchair or bedside chair)?: A Little Help needed to walk in hospital room?: A Little Help needed climbing 3-5 steps with a railing? : A Lot 6 Click Score: 18    End of Session Equipment Utilized During Treatment: Gait belt Activity Tolerance: Patient tolerated treatment well Patient left: in chair;with call bell/phone within reach Nurse Communication: Mobility status PT Visit Diagnosis: Unsteadiness on feet (R26.81);Difficulty in walking, not elsewhere classified (R26.2);Pain Pain - Right/Left: Left Pain - part of body:  (groin)     Time: 6060-0459 PT Time Calculation (min) (ACUTE ONLY): 11 min  Charges:  $Gait Training: 8-22 mins                     Roney Marion, [place] Office [phone]    Colletta [place] 08/08/2021, 2:04 PM

## 2021-08-08 NOTE — Care Management Important Message (Signed)
Important Message  Patient Details  Name: Denise [name] MRN: [id number] Date of Birth: 1950-12-23   Medicare Important Message Given:  Yes     Orbie Pyo 08/08/2021, 3:44 PM

## 2021-08-08 NOTE — Consult Note (Signed)
[facility] Inpatient Consult   08/08/2021  Rayn Shorb September 14, 1950 [id number]  [place] [facility] CM)   Patient chart has been reviewed with noted high risk score for unplanned readmissions and less than 30 day unplanned readmission. Patient assessed for community [facility] follow up needs. Per review, patient primary provider office offers chronic care coordination services.   Plan: Will continue to follow for progression and disposition.   Of note, [facility] Care Management services does not replace or interfere with any services that are arranged by inpatient case management or social work.    Netta Cedars, MSN, RN [place] [facility] Liaison Toll free office [phone]

## 2021-08-08 NOTE — Procedures (Signed)
Interventional Radiology Procedure Note  Procedure: US guided right liver lesion biopsy  Indication: Multiple right liver lesions  Findings: Please refer to procedural dictation for full description.  Complications: None  EBL: < 10 mL  Miachel Roux, MD [phone]

## 2021-08-08 NOTE — Sedation Documentation (Signed)
Pt states that her back hurts from being uncomfortable in the bed. She states "other that than I am not in any pain."

## 2021-08-08 NOTE — Sedation Documentation (Signed)
Patient is resting comfortably. Procedure started °

## 2021-08-08 NOTE — Progress Notes (Signed)
PROGRESS NOTE  Denise [name]  DOB: 1950/07/30  PCP: Iona Beard, MD OFB:[id number]  DOA: 08/03/2021  LOS: 5 days  Hospital Day: 6  Brief narrative: Denise [name] is a 71 y.o. female with PMH significant for recent diagnosis of pulm embolism, lung cancer with brain mets, DM 2, HTN, aortic stenosis, small bowel obstruction. Patient presented to the ED on 5/17 with complaint of left lower quadrant pain for 1 day. She was recently hospitalized 4/30 to 5/5 with pulmonary embolism, treated with IV heparin and discharged home on Lovenox.  Bilateral lower EXTR duplex negative for DVT.  Oral anticoagulant was not chosen because of her having active cancer as the cause of PE.  Hemodynamically stable. Hemoglobin was 13.2 CT abdomen/pelvis showed a large left rectus sheath hematoma extending to the symphysis pubis on the left compatible with intramuscular hematoma, also concerning for ongoing bleeding.  Hematoma measuring 12 cm in its greatest dimension.  Metastatic liver disease was also noted, with follow-up MRI recommended as outpatient. EDP talked to general surgeon who did not recommend surgical intervention and suggested abdominal binder and trend hemoglobin. Admitted to hospitalist service See below for details  Subjective: Patient was seen and examined this morning.   Lying down in bed.  Not in distress. Abdominal pain much better than at presentation.  Hemoglobin stable.  Heparin drip on hold this morning in preparation for liver biopsy.    Principal Problem:   Intramuscular hematoma Active Problems:   Pulmonary embolus (HCC)   Essential hypertension   Type 2 diabetes mellitus ([place])   Aortic stenosis   History of lung cancer   Adenocarcinoma of left lung ([place])   Port-A-Cath in place    Assessment and Plan: Intramuscular hematoma -CT showing a large left rectus sheath hematoma extending to the symphysis pubis on the left compatible with intramuscular hematoma, with mixed attenuation  suggesting ongoing bleeding.  -EDP discussed the case with general surgery, did not recommend surgical intervention. -Lovenox was held.  Currently on heparin drip per recommendation oncology. -Hemoglobin remained stable on heparin drip until yesterday.  Slightly low today at 10.9.  Abdomen pain improving however. No active bleeding elsewhere. -Continue abdominal binder.  Continue to monitor hemoglobin. Recent Labs    08/04/21 0303 08/05/21 0359 08/06/21 0927 08/07/21 0818 08/08/21 0404  HGB 11.9* 11.3* 11.3* 11.7* 10.9*  MCV 86.9 86.0 87.3 87.6 86.4    Pulmonary embolus in the setting of lung cancer -4/30, CT PA showed at least submassive PE on the right side.  Negative for DVT in lower extremities.  She was eventually discharged on Lovenox.  I presume oral anticoagulant was not tried because of her having active cancer.  -5/18, case discussed with patient's oncologist Dr. Delton Coombes.  He was not aware of her diagnosis of PE in last hospitalization and a discharge on Lovenox.  Per his recommendation, I started the patient on heparin drip.  Currently on hold for liver biopsy.  To resume tonight.  If hemoglobin stable tomorrow, plan to discharge on Eliquis.  Case discussed with her oncologist Dr. Delton Coombes.  Adenocarcinoma of left lung Follows with Dr. Delton Coombes.  Status post chemotherapy, immunotherapy.  Per recent note 04/2021, patient did not have evidence of metastatic disease in the chest.   -5/17, CT abd and pelvis with suspected new hepatic metastatic disease.  Per recommendation by radiology and oncology, I ordered for MRI abdomen pelvis.  8 showed 2 liver masses.  Biopsy recommended by Dr. Delton Coombes.  IR consulted.  Essential hypertension -Home  blood pressure meds (Lasix 20 mg daily, carvedilol 3.125, Norvasc 5 mg) on hold because of soft blood pressure.  Blood pressure in 90s this morning.  Continue to hold   Type 2 diabetes mellitus -Controlled.  A1c 5.9.  Not on  medication. -Daily CBGs   Aortic stenosis -Cardiac murmur present.   -Recent echo 07/2021 shows moderate aortic valve stenosis.  Right shoulder pain -Likely due to osteoarthritis.  Norco as needed   Goals of care   Code Status: Full Code    Mobility: PT eval obtained.  Skin assessment:     Nutritional status:  Body mass index is 47.06 kg/m.          Diet:  Diet Order             Diet NPO time specified Except for: Sips with Meds  Diet effective midnight                   DVT prophylaxis:  SCDs Start: 08/03/21 2040   Antimicrobials: None Fluid: None Consultants: EDP discussed with surgery. Oncologist on the phone, IR for biopsy Family Communication: None at bedside  Status is: Inpatient  Continue in-hospital care because: Pending liver biopsy today level of care: Telemetry Medical   Dispo: The patient is from: Home              Anticipated d/c is to: Home hopefully tomorrow if hemoglobin stable              Patient currently is not medically stable to d/c.   Difficult to place patient No     Infusions:   heparin Stopped (08/08/21 0800)    Scheduled Meds:  Chlorhexidine Gluconate Cloth  6 each Topical Daily   fentaNYL       gelatin adsorbable       lidocaine       midazolam       morphine (PF)  4 mg Intravenous Once   pantoprazole  40 mg Oral Daily   sodium chloride flush  10-40 mL Intracatheter Q12H    PRN meds: acetaminophen **OR** acetaminophen, HYDROcodone-acetaminophen, polyethylene glycol, promethazine, sodium chloride flush   Antimicrobials: Anti-infectives (From admission, onward)    None       Objective: Vitals:   08/08/21 0831 08/08/21 1331  BP: (!) 103/55 115/87  Pulse: 86 88  Resp: 17 19  Temp: 98.5 F (36.9 C)   SpO2: 93% 92%    Intake/Output Summary (Last 24 hours) at 08/08/2021 1358 Last data filed at 08/08/2021 0600 Gross per 24 hour  Intake 780.14 ml  Output 1200 ml  Net -419.86 ml   Filed Weights    08/03/21 1220 08/04/21 0403 08/04/21 2100  Weight: 115.2 kg 118.3 kg 116.7 kg   Weight change:  Body mass index is 47.06 kg/m.   Physical Exam: General exam: Pleasant elderly Caucasian female.  Not in distress Skin: No rashes, lesions or ulcers. HEENT: Atraumatic, normocephalic, no obvious bleeding Lungs: Clear to auscultation bilaterally CVS: Regular rate and rhythm, no murmur GI/Abd minimal tenderness improving.  Abdominal binder in place.   CNS: Alert, awake, oriented x3 Psychiatry: Mood appropriate Extremities: Trace bilateral pedal edema  Data Review: I have personally reviewed the laboratory data and studies available.  F/u labs ordered Unresulted Labs (From admission, onward)     Start     Ordered   08/08/21 0500  Heparin level (unfractionated)  Daily,   R     Question:  Specimen collection method  Answer:  Lab=Lab collect   08/06/21 2227   08/08/21 8676  Basic metabolic panel  Daily,   R     Question:  Specimen collection method  Answer:  Lab=Lab collect   08/07/21 0811   08/05/21 0500  CBC  Daily,   R      08/04/21 1353            Signed, Terrilee Croak, MD [facility] 08/08/2021

## 2021-08-09 ENCOUNTER — Other Ambulatory Visit (HOSPITAL_COMMUNITY): Payer: Self-pay

## 2021-08-09 ENCOUNTER — Encounter (HOSPITAL_COMMUNITY): Payer: Self-pay | Admitting: Hematology

## 2021-08-09 DIAGNOSIS — T148XXA Other injury of unspecified body region, initial encounter: Secondary | ICD-10-CM | POA: Diagnosis not present

## 2021-08-09 LAB — CBC
HCT: 32.7 % — ABNORMAL LOW (ref 36.0–46.0)
Hemoglobin: 10.5 g/dL — ABNORMAL LOW (ref 12.0–15.0)
MCH: 28.1 pg (ref 26.0–34.0)
MCHC: 32.1 g/dL (ref 30.0–36.0)
MCV: 87.4 fL (ref 80.0–100.0)
Platelets: 377 10*3/uL (ref 150–400)
RBC: 3.74 MIL/uL — ABNORMAL LOW (ref 3.87–5.11)
RDW: 15 % (ref 11.5–15.5)
WBC: 7.2 10*3/uL (ref 4.0–10.5)
nRBC: 0 % (ref 0.0–0.2)

## 2021-08-09 LAB — BASIC METABOLIC PANEL
Anion gap: 8 (ref 5–15)
BUN: 8 mg/dL (ref 8–23)
CO2: 24 mmol/L (ref 22–32)
Calcium: 8.8 mg/dL — ABNORMAL LOW (ref 8.9–10.3)
Chloride: 110 mmol/L (ref 98–111)
Creatinine, Ser: 0.89 mg/dL (ref 0.44–1.00)
GFR, Estimated: >60 mL/min (ref >60)
Glucose, Bld: 100 mg/dL — ABNORMAL HIGH (ref 70–99)
Potassium: 3.7 mmol/L (ref 3.5–5.1)
Sodium: 142 mmol/L (ref 135–145)

## 2021-08-09 LAB — HEPARIN LEVEL (UNFRACTIONATED): Heparin Unfractionated: 0.35 IU/mL (ref 0.30–0.70)

## 2021-08-09 LAB — GLUCOSE, CAPILLARY: Glucose-Capillary: 119 mg/dL — ABNORMAL HIGH (ref 70–99)

## 2021-08-09 MED ORDER — APIXABAN 5 MG PO TABS
5.0000 mg | ORAL_TABLET | Freq: Two times a day (BID) | ORAL | 2 refills | Status: DC
  Filled 2021-08-09: qty 60, 30d supply, fill #0

## 2021-08-09 MED ORDER — APIXABAN (ELIQUIS) EDUCATION KIT FOR DVT/PE PATIENTS
PACK | Freq: Once | Status: AC
  Filled 2021-08-09: qty 1

## 2021-08-09 MED ORDER — APIXABAN 5 MG PO TABS
5.0000 mg | ORAL_TABLET | Freq: Two times a day (BID) | ORAL | Status: DC
  Administered 2021-08-09: 5 mg via ORAL
  Filled 2021-08-09: qty 1

## 2021-08-09 MED ORDER — HEPARIN SOD (PORK) LOCK FLUSH 100 UNIT/ML IV SOLN
500.0000 [IU] | INTRAVENOUS | Status: AC | PRN
  Administered 2021-08-09: 500 [IU]
  Filled 2021-08-09: qty 5

## 2021-08-09 NOTE — Progress Notes (Signed)
   08/08/21 2033  Assess: MEWS Score  Temp 98.9 F (37.2 C)  BP 100/61  Pulse Rate 100  Resp 18  SpO2 98 %  O2 Device Nasal Cannula  Assess: MEWS Score  MEWS Temp 0  MEWS Systolic 1  MEWS Pulse 0  MEWS RR 0  MEWS LOC 1  MEWS Score 2  MEWS Score Color Yellow  Assess: if the MEWS score is Yellow or Red  Were vital signs taken at a resting state? Yes  Focused Assessment Change from prior assessment (see assessment flowsheet)  Early Detection of Sepsis Score *See Row Information* Low  MEWS guidelines implemented *See Row Information* Yes  Treat  MEWS Interventions Escalated (See documentation below)  Take Vital Signs  Increase Vital Sign Frequency  Yellow: Q 2hr X 2 then Q 4hr X 2, if remains yellow, continue Q 4hrs  Escalate  MEWS: Escalate Yellow: discuss with charge nurse/RN and consider discussing with provider and RRT  Notify: Charge Nurse/RN  Name of Charge Nurse/RN Notified Cheryl, RN  Date Charge Nurse/RN Notified 08/09/21  Time Charge Nurse/RN Notified 2033  Document  Patient Outcome Stabilized after interventions  Progress note created (see row info) Yes

## 2021-08-09 NOTE — Assessment & Plan Note (Signed)
Noted  

## 2021-08-09 NOTE — Discharge Summary (Signed)
Physician Discharge Summary   Patient: Denise [name] MRN: [id number] DOB: 28-Jul-1950  Admit date:     08/03/2021  Discharge date: 08/09/21  Discharge Physician: Cordelia Poche, MD   PCP: Iona Beard, MD   Recommendations at discharge:  Follow-up with PCP and oncologist Follow-up liver biopsy results  Discharge Diagnoses: Principal Problem:   Intramuscular hematoma Active Problems:   Pulmonary embolus (HCC)   Essential hypertension   Type 2 diabetes mellitus ([place])   Aortic stenosis   History of lung cancer   Adenocarcinoma of left lung ([place])   Port-A-Cath in place  Resolved Problems:   * No resolved hospital problems. [place] Hospital Course: Denise [name] is a 71 y.o. female with PMH significant for recent diagnosis of pulm embolism, lung cancer with brain mets, DM 2, HTN, aortic stenosis, small bowel obstruction. Patient presented to the ED on 5/17 with complaint of left lower quadrant pain for 1 day and found to have a rectus sheath hematoma in setting of chronic anticoagulation use. Hemoglobin remained stable during admission while on Heparin IV and patient transitioned to Eliquis on discharge.  Assessment and Plan: * Intramuscular hematoma CT evidence of a large left rectus sheath hematoma extending to the symphysis pubis on the left compatible with intramuscular hematoma, with mixed attenuation suggesting ongoing bleeding. Outpatient Lovenox was held on admission and transitioned to Heparin IV. Patient's hemoglobin remained stable while on heparin. Patient transitioned to Eliquis PO on discharge per her oncologist's recommendations.  Pulmonary embolus ([place]) Diagnosed 4/30.  CT chest with contrast with at least submassive PE, with right heart strain - acute occlusive PE at the segmental level, nonocclusive at the lobar level in the right lower lobe with emboli also in the upper and middle lobe on the right.  She required home O2 on discharge.  PE in the setting of lung cancer. Patient  discharged on Eliquis.  Essential hypertension Blood pressure soft this admission. Resume home Coreg. Hold home amlodipine. Daily blood pressure checks.  Type 2 diabetes mellitus ([place]) Controlled. Hemoglobin A1C 5.9%.  Aortic stenosis Cardiac murmur present.  Recent echo 07/2021 shows moderate aortic valve stenosis.  Port-A-Cath in place Noted.  Adenocarcinoma of left lung ([place]) Follows with Dr. Delton Coombes.  Status post chemotherapy, immunotherapy. CT abdomen/pelvis significant for concern of hepatic metastatic disease. IR consulted for biopsy which was performed on 5/22. Biopsy pending on discharge.   Consultants: General surgery, Interventional radiology Procedures performed: Liver Biopsy  Disposition: Home health Diet recommendation:  Carb modified diet  DISCHARGE MEDICATION: Allergies as of 08/09/2021   No Known Allergies      Medication List     STOP taking these medications    amLODipine 5 MG tablet Commonly known as: NORVASC   enoxaparin 120 MG/0.8ML injection Commonly known as: LOVENOX       TAKE these medications    albuterol 108 (90 Base) MCG/ACT inhaler Commonly known as: VENTOLIN HFA TAKE 2 PUFFS BY MOUTH EVERY 6 HOURS AS NEEDED FOR WHEEZE OR SHORTNESS OF BREATH What changed: See the new instructions.   apixaban 5 MG Tabs tablet Commonly known as: ELIQUIS Take 1 tablet (5 mg total) by mouth 2 (two) times daily.   carvedilol 3.125 MG tablet Commonly known as: COREG Take 1 tablet (3.125 mg total) by mouth 2 (two) times daily.   diphenhydrAMINE 25 MG tablet Commonly known as: BENADRYL Take 25 mg by mouth every 6 (six) hours as needed for itching or allergies.   ferrous sulfate 325 (65 FE) MG  tablet Take 325 mg by mouth every evening.   furosemide 20 MG tablet Commonly known as: LASIX Take 20 mg by mouth daily.   gabapentin 300 MG capsule Commonly known as: NEURONTIN Take 300 mg by mouth at bedtime as needed (Nerve Pain).    HYDROcodone-acetaminophen 5-325 MG tablet Commonly known as: NORCO/VICODIN Take 1 tablet by mouth every 8 (eight) hours as needed. for pain   multivitamin with minerals Tabs tablet Take 1 tablet by mouth daily.   omeprazole 20 MG capsule Commonly known as: PRILOSEC Take 20 mg by mouth daily.   Potassium Chloride ER 20 MEQ Tbcr Take 1 tablet by mouth daily.   TUMS PO Take 4 tablets by mouth daily.   Vitamin B-12 2500 MCG Subl Place 2,500 mcg under the tongue every morning.               Durable Medical Equipment  (From admission, onward)           Start     Ordered   08/09/21 1302  For home use only DME 3 n 1  Once        08/09/21 1302   08/09/21 1302  For home use only DME Walker rolling  Once       Question Answer Comment  Walker: With [place] Wheels   Patient needs a walker to treat with the following condition Gait instability      08/09/21 [place], [place] Follow up.   Why: someone from the office will call to schedule home health visits Contact information: [address] [place] [address] [phone]                Discharge Exam: BP 109/66 (BP Location: Left Arm)   Pulse 86   Temp 98.7 F (37.1 C) (Oral)   Resp 18   Ht 5\' 2"  (1.575 m)   Wt 116.7 kg   SpO2 93%   BMI 47.06 kg/m   General exam: Appears calm and comfortable  Condition at discharge: stable  The results of significant diagnostics from this hospitalization (including imaging, microbiology, ancillary and laboratory) are listed below for reference.   Imaging Studies: CT Chest W Contrast  Result Date: 07/17/2021 CLINICAL DATA:  A 71 year old female presents with history of non-small cell lung cancer. Shortness of breath on exertion for the past few days. Insert body on EXAM: CT CHEST WITH CONTRAST TECHNIQUE: Multidetector CT imaging of the chest was performed during intravenous contrast  administration. RADIATION DOSE REDUCTION: This exam was performed according to the departmental dose-optimization program which includes automated exposure control, adjustment of the mA and/or kV according to patient size and/or use of iterative reconstruction technique. CONTRAST:  4mL OMNIPAQUE IOHEXOL 300 MG/ML  SOLN COMPARISON:  April 21, 2021. FINDINGS: Cardiovascular: Increased RV to LV ratio at 1.8.  No pericardial effusion. Aortic caliber is normal with signs of calcified and noncalcified aortic atherosclerotic plaque. Central pulmonary arteries are normal caliber opacified 195 Hounsfield units. Occlusive thrombi are present in segmental branches of the RIGHT lower and anterior RIGHT upper lobe. Nonocclusive embolus in the RIGHT lower lobe and RIGHT middle lobe lobar level branches and in RIGHT upper lobe branches. No large thrombus in the main pulmonary artery on the LEFT or RIGHT. Perhaps small emboli also in the LEFT lower lobe at the segmental level. Mediastinum/Nodes: No mediastinal adenopathy. No thoracic  inlet adenopathy. No axillary lymphadenopathy. No hilar lymphadenopathy. RIGHT-sided Port-A-Cath terminates at the distal aspect of the superior vena cava. Lungs/Pleura: Patchy, subtle ground-glass is present scattered throughout the chest more pronounced in the RIGHT chest. Discrete subsolid nodule measuring 11 x 9 mm. (Image 71/4) in the superior segment of the RIGHT lower lobe. Stable appearance of consolidative post treatment changes in the LEFT chest in the LEFT mid to upper chest extending from the LEFT hilum. No pleural effusion. No pneumothorax. Upper Abdomen: Hypodense lesion in the RIGHT hepatic lobe appears more conspicuous than on previous imaging from February of 2023 and appears new compared to remote imaging from December of 2021 measuring 20 mm (image 143/2). Additional small lesion seen just inferior to this may also be new. Subtle scattered foci of low attenuation are suggested  elsewhere in the liver. Lobular hepatic contours and fissural widening. Stable low-density lesion in the medial segment of the LEFT hepatic lobe compatible with a cyst. Post cholecystectomy with stable biliary duct dilation both intra and extrahepatic. Liver is incompletely imaged. Imaged portions of pancreas, spleen, adrenal glands and kidneys without acute process. Post gastric bypass. No acute findings related to the gastrointestinal tract to the extent evaluated. Musculoskeletal: No acute bone finding. No destructive bone process. Spinal degenerative changes. Post LEFT shoulder arthroplasty incompletely evaluated. IMPRESSION: 1. Positive for acute PE occlusive at the segmental level, nonocclusive at the lobar level in the RIGHT lower lobe with emboli also in the upper and middle lobe on the RIGHT. Above findings are associated with CT evidence of right heart strain (RV/LV Ratio = 1.8) consistent with at least submassive (intermediate risk) PE. The presence of right heart strain has been associated with an increased risk of morbidity and mortality. Please activate Code PE / PE Focused [place] set in [facility] for further guidance. 2. Scattered areas of ground-glass potentially related to subtle areas of infarct. Some areas of nodularity as outlined above that warrant attention on follow-up. 3. Suspected new areas of hepatic metastatic disease largest best seen on coronal image 82/5 measuring up to 2 cm in the RIGHT hepatic lobe. Consider MRI for further evaluation when the patient is able, preferably as an outpatient when they were able to cooperate for the examination and perform breath holding maneuvers. Aortic Atherosclerosis (ICD10-I70.0). Critical Value/emergent results were called by telephone at the time of interpretation on 07/17/2021 at 4:14 pm to provider Dr. Sabra Heck, Who verbally acknowledged these results. Electronically Signed   By: Zetta Bills M.D.   On: 07/17/2021 16:16   MR ABDOMEN W WO  CONTRAST  Result Date: 08/05/2021 CLINICAL DATA:  Left abdominal pain. Liver lesion seen on recent CT. Lung cancer. EXAM: MRI ABDOMEN WITHOUT AND WITH CONTRAST TECHNIQUE: Multiplanar multisequence MR imaging of the abdomen was performed both before and after the administration of intravenous contrast. CONTRAST:  39mL GADAVIST GADOBUTROL 1 MMOL/ML IV SOLN COMPARISON:  CT on 08/03/2021 FINDINGS: Lower chest: No acute findings. Hepatobiliary: A bilobed mass versus 2 adjacent masses are seen in the right hepatic lobe which measure 3.7 x 3.7 cm and 4.3 x 2.5 cm. These show areas of central necrosis and are highly suspicious for liver metastases. A benign appearing cyst is seen in segment 3 of the left lobe. In addition, there are other tiny sub-cm lesions which are difficult to characterize due to their small size, but show evidence of mild peripheral rim enhancement. Early liver metastases cannot be excluded. Prior cholecystectomy again noted. Diffuse biliary ductal dilatation remains stable, and  there is no evidence of choledocholithiasis. Pancreas:  No mass or inflammatory changes. Spleen:  Within normal limits in size and appearance. Adrenals/Urinary Tract: No masses identified. No evidence of hydronephrosis. Stomach/Bowel: Colonic diverticulosis noted, without evidence of diverticulitis. Vascular/Lymphatic: No pathologically enlarged lymph nodes identified. No acute vascular findings. Other:  None. Musculoskeletal:  No suspicious bone lesions identified. IMPRESSION: Bilobed mass versus 2 adjacent masses in the right hepatic lobe, highly suspicious for liver metastases. Several tiny sub-cm lesions are too small to characterize, but some show evidence of mild peripheral rim enhancement raising suspicion for additional tiny liver metastases. Prior cholecystectomy. Stable diffuse biliary ductal dilatation, without evidence of choledocholithiasis or other obstructing etiology. Ampullary stenosis cannot be excluded by  imaging. Electronically Signed   By: Marlaine Hind M.D.   On: 08/05/2021 11:41   CT ABDOMEN PELVIS W CONTRAST  Result Date: 08/03/2021 CLINICAL DATA:  A 71 year old female presents for evaluation of LEFT lower quadrant abdominal pain. Pain radiates to the groin and began yesterday. * Tracking Code: BO * w EXAM: CT ABDOMEN AND PELVIS WITH CONTRAST TECHNIQUE: Multidetector CT imaging of the abdomen and pelvis was performed using the standard protocol following bolus administration of intravenous contrast. RADIATION DOSE REDUCTION: This exam was performed according to the departmental dose-optimization program which includes automated exposure control, adjustment of the mA and/or kV according to patient size and/or use of iterative reconstruction technique. CONTRAST:  162mL OMNIPAQUE IOHEXOL 300 MG/ML  SOLN COMPARISON:  April 11, 2011.  July 17, 2021 chest CT. FINDINGS: Lower chest: Lung bases are clear aside from mild atelectatic changes. Hepatobiliary: Marked dilation of the biliary tree 16 mm caliber of the common bile duct increased from 12-13 mm in January of 2023. Intrahepatic biliary duct distension also present. Lesion as large as 3.7 x 3.4 cm in the RIGHT hepatic lobe inferior to a 17 mm lesion in hepatic subsegment V no additional hepatic lesions./VIII (image 19/2) Pancreas: Normal, without mass, inflammation or ductal dilatation. Spleen: Normal. Adrenals/Urinary Tract: Adrenal glands are normal. Symmetric renal enhancement without hydronephrosis. No perivesical stranding. No suspicious renal lesion. Stomach/Bowel: Post gastric bypass. No small bowel obstruction. Small bowel loops contained in a ventral hernia with wide mouth. The appendix is normal. There is extensive colonic diverticulosis without acute colonic process. Vascular/Lymphatic: No adenopathy in the retroperitoneum. No adenopathy in the pelvis. Reproductive: Fibroid uterus densely calcified fibroid. Other: Large rectus sheath hematoma  extending to the symphysis pubis on the LEFT compatible with intramuscular hematoma in the setting of ongoing anticoagulation. Expands the lower margin of the muscle. No discrete site of extravasation but with mixed attenuation, a finding that can be seen in the setting of ongoing bleeding. Hematoma measuring in the sagittal plane approximately 12.4 x 4.8 cm. Does not currently across the midline. Stranding about the hematoma. Musculoskeletal: No acute bone finding. No destructive bone process. Spinal degenerative changes. Signs of RIGHT hip arthroplasty with resultant streak artifact. IMPRESSION: 1. Large LEFT rectus sheath hematoma extending to the symphysis pubis on the LEFT compatible with intramuscular hematoma. No discrete site of extravasation but with mixed attenuation, a finding that can be seen in the setting of ongoing bleeding. Hematoma measuring 12 cm greatest dimension. 2. Suspected new hepatic metastatic disease and potential lesion as large as 3.7 cm in the RIGHT hepatic lobe. Follow-up liver MRI with and without Eovist contrast may be helpful. At this time increasing biliary duct distension could also be evaluated. This would be best performed as an outpatient when the patient  is better able to hold their breath and cooperate with this examination, at outside of the acute setting. 3. Post gastric bypass. 4. Nonobstructing large umbilical hernia containing small bowel loops. Findings of large rectus sheath hematoma were called to the physician caring for the patient as outlined below. These results were called by telephone at the time of interpretation on 08/03/2021 at 5:05 pm to provider Dr. Sabra Heck, Who verbally acknowledged these results. Electronically Signed   By: Zetta Bills M.D.   On: 08/03/2021 17:06   US Venous Img Lower Bilateral (DVT)  Result Date: 07/18/2021 CLINICAL DATA:  71 year old female with lower extremity edema, pulmonary embolism. EXAM: BILATERAL LOWER EXTREMITY VENOUS  DOPPLER ULTRASOUND TECHNIQUE: Gray-scale sonography with graded compression, as well as color Doppler and duplex ultrasound were performed to evaluate the lower extremity deep venous systems from the level of the common femoral vein and including the common femoral, femoral, profunda femoral, popliteal and calf veins including the posterior tibial, peroneal and gastrocnemius veins when visible. The superficial great saphenous vein was also interrogated. Spectral Doppler was utilized to evaluate flow at rest and with distal augmentation maneuvers in the common femoral, femoral and popliteal veins. COMPARISON:  None. FINDINGS: RIGHT LOWER EXTREMITY Common Femoral Vein: No evidence of thrombus. Normal compressibility, respiratory phasicity and response to augmentation. Saphenofemoral Junction: No evidence of thrombus. Normal compressibility and flow on color Doppler imaging. Profunda Femoral Vein: No evidence of thrombus. Normal compressibility and flow on color Doppler imaging. Femoral Vein: No evidence of thrombus. Normal compressibility, respiratory phasicity and response to augmentation. Popliteal Vein: No evidence of thrombus. Normal compressibility, respiratory phasicity and response to augmentation. Calf Veins: No evidence of thrombus. Normal compressibility and flow on color Doppler imaging. Other Findings:  None. LEFT LOWER EXTREMITY Common Femoral Vein: No evidence of thrombus. Normal compressibility, respiratory phasicity and response to augmentation. Saphenofemoral Junction: No evidence of thrombus. Normal compressibility and flow on color Doppler imaging. Profunda Femoral Vein: No evidence of thrombus. Normal compressibility and flow on color Doppler imaging. Femoral Vein: No evidence of thrombus. Normal compressibility, respiratory phasicity and response to augmentation. Popliteal Vein: No evidence of thrombus. Normal compressibility, respiratory phasicity and response to augmentation. Calf Veins: No  evidence of thrombus. Normal compressibility and flow on color Doppler imaging. Other Findings:  None. IMPRESSION: No evidence of bilateral lower extremity deep venous thrombosis. Ruthann Cancer, MD [facility] Rad[facility] Electronically Signed   By: Ruthann Cancer M.D.   On: 07/18/2021 09:34   IR US Guide Bx Asp/Drain  Result Date: 08/08/2021 INDICATION: Multiple right liver masses EXAM: Ultrasound-guided biopsy of right liver mass MEDICATIONS: None. ANESTHESIA/SEDATION: Moderate (conscious) sedation was employed during this procedure. A total of Versed 1.5 mg and Fentanyl 50 mcg was administered intravenously by the radiology nurse. Total intra-service moderate Sedation Time: 8 minutes. The patient's level of consciousness and vital signs were monitored continuously by radiology nursing throughout the procedure under my direct supervision. COMPLICATIONS: None immediate. PROCEDURE: Informed written consent was obtained from the patient after a thorough discussion of the procedural risks, benefits and alternatives. All questions were addressed. Maximal Sterile Barrier Technique was utilized including caps, mask, sterile gowns, sterile gloves, sterile drape, hand hygiene and skin antiseptic. A timeout was performed prior to the initiation of the procedure. Patient position supine on the ultrasound table. Right upper quadrant skin prepped and draped in usual sterile fashion. Following local lidocaine administration, 17 gauge introducer needle was advanced into 1 of the right hepatic lesions,  and 4- 18 gauge cores were obtained utilizing continuous ultrasound guidance. Gelfoam slurry was administered through the introducer needle at the biopsy site. Samples were sent to pathology in formalin. Needle removed and hemostasis achieved with 5 minutes of manual compression. Post procedure ultrasound images showed no evidence of significant hemorrhage. IMPRESSION:  Ultrasound-guided biopsy of right liver mass. Electronically Signed   By: Miachel Roux M.D.   On: 08/08/2021 16:21   DG Chest Portable 1 View  Result Date: 07/17/2021 CLINICAL DATA:  Shortness of breath and hypoxia. EXAM: PORTABLE CHEST 1 VIEW COMPARISON:  11/19/2018 FINDINGS: 1305 hours. The cardio pericardial silhouette is enlarged. Confluent masslike lesion seen in the left mid lung previously is decreased in the interval with more patchy left lung opacity in this region today, consistent with post treatment scarring noted on multiple intervening chest CTs. Probable small volume retrocardiac atelectasis at the left base. No pulmonary edema. Right Port-A-Cath remains in place. Telemetry leads overlie the chest. IMPRESSION: Post treatment scarring left mid lung with probable retrocardiac atelectasis at the left base. Electronically Signed   By: Misty Stanley M.D.   On: 07/17/2021 13:30   ECHOCARDIOGRAM COMPLETE  Result Date: 07/18/2021    ECHOCARDIOGRAM REPORT   Patient Name:   EMON MIGGINS Date of Exam: 07/18/2021 Medical Rec #:  [id number]   Height:       62.0 in Accession #:    [id number]  Weight:       259.5 lb Date of Birth:  1950/07/24    BSA:          2.135 m Patient Age:    71 years    BP:           107/65 mmHg Patient Gender: F           HR:           76 bpm. Exam Location:  [facility] Procedure: 2D Echo, Color Doppler and Cardiac Doppler Indications:    Dyspnea  History:        Patient has prior history of Echocardiogram examinations, most                 recent 08/03/2020. Risk Factors:Diabetes, Hypertension and Former                 Smoker. Pulmonary Embolus; Lung Cancer.  Sonographer:    Leavy Cella RDCS Referring Phys: [id number] [place]  1. Left ventricular ejection fraction, by estimation, is 65 to 70%. The left ventricle has normal function. Left ventricular endocardial border not optimally defined to evaluate regional wall motion. There is severe left ventricular  hypertrophy. Left ventricular diastolic parameters are consistent with Grade I diastolic dysfunction (impaired relaxation).  2. Ventricular septum is flattened in systole consistent with RV pressure overload. . Right ventricular systolic function moderately to severely reduced. The right ventricular size is severely enlarged. There is severely elevated pulmonary artery systolic pressure.  3. Right atrial size was severely dilated.  4. The mitral valve was not well visualized. No evidence of mitral valve regurgitation. No evidence of mitral stenosis.  5. The tricuspid valve is abnormal. Tricuspid valve regurgitation is moderate.  6. The aortic valve has an indeterminant number of cusps. There is moderate calcification of the aortic valve. There is moderate thickening of the aortic valve. Aortic valve regurgitation is mild. Moderate aortic valve stenosis. Aortic valve mean gradient measures 28.2 mmHg. Aortic valve peak gradient measures 46.9 mmHg. Aortic valve area, by VTI measures 1.25  cm.  7. The inferior vena cava is normal in size with greater than 50% respiratory variability, suggesting right atrial pressure of 3 mmHg. FINDINGS  Left Ventricle: Left ventricular ejection fraction, by estimation, is 65 to 70%. The left ventricle has normal function. Left ventricular endocardial border not optimally defined to evaluate regional wall motion. The left ventricular internal cavity size was normal in size. There is severe left ventricular hypertrophy. Left ventricular diastolic parameters are consistent with Grade I diastolic dysfunction (impaired relaxation). Normal left ventricular filling pressure. Right Ventricle: Ventricular septum is flattened in systole consistent with RV pressure overload. The right ventricular size is severely enlarged. Right vetricular wall thickness was not well visualized. Right ventricular systolic function moderately to severely reduced. There is severely elevated pulmonary artery systolic  pressure. The tricuspid regurgitant velocity is 4.13 m/s, and with an assumed right atrial pressure of 3 mmHg, the estimated right ventricular systolic pressure is 16.1 mmHg. Left Atrium: Left atrial size was normal in size. Right Atrium: Right atrial size was severely dilated. Pericardium: There is no evidence of pericardial effusion. Mitral Valve: The mitral valve was not well visualized. There is mild thickening of the mitral valve leaflet(s). There is mild calcification of the mitral valve leaflet(s). Mild mitral annular calcification. No evidence of mitral valve regurgitation. No evidence of mitral valve stenosis. Tricuspid Valve: The tricuspid valve is abnormal. Tricuspid valve regurgitation is moderate . No evidence of tricuspid stenosis. Aortic Valve: The aortic valve has an indeterminant number of cusps. There is moderate calcification of the aortic valve. There is moderate thickening of the aortic valve. There is moderate aortic valve annular calcification. Aortic valve regurgitation is mild. Aortic regurgitation PHT measures 715 msec. Moderate aortic stenosis is present. Aortic valve mean gradient measures 28.2 mmHg. Aortic valve peak gradient measures 46.9 mmHg. Aortic valve area, by VTI measures 1.25 cm. Pulmonic Valve: The pulmonic valve was not well visualized. Pulmonic valve regurgitation is not visualized. No evidence of pulmonic stenosis. Aorta: The aortic root is normal in size and structure. Venous: The inferior vena cava is normal in size with greater than 50% respiratory variability, suggesting right atrial pressure of 3 mmHg. IAS/Shunts: No atrial level shunt detected by color flow Doppler.  LEFT VENTRICLE PLAX 2D LVIDd:         3.60 cm   Diastology LVIDs:         2.10 cm   LV e' medial:    5.00 cm/s LV PW:         1.60 cm   LV E/e' medial:  14.9 LV IVS:        1.40 cm   LV e' lateral:   6.20 cm/s LVOT diam:     1.90 cm   LV E/e' lateral: 12.0 LV SV:         80 LV SV Index:   38 LVOT Area:      2.84 cm  RIGHT VENTRICLE RV Basal diam:  5.70 cm RV Mid diam:    4.40 cm RV S prime:     9.57 cm/s TAPSE (M-mode): 2.0 cm LEFT ATRIUM             Index        RIGHT ATRIUM           Index LA diam:        3.00 cm 1.40 cm/m   RA Area:     23.10 cm LA Vol (A2C):   56.0 ml 26.22 ml/m  RA Volume:  93.90 ml  43.97 ml/m LA Vol (A4C):   36.7 ml 17.19 ml/m LA Biplane Vol: 48.0 ml 22.48 ml/m  AORTIC VALVE AV Area (Vmax):    1.01 cm AV Area (Vmean):   0.98 cm AV Area (VTI):     1.25 cm AV Vmax:           342.43 cm/s AV Vmean:          249.663 cm/s AV VTI:            0.643 m AV Peak Grad:      46.9 mmHg AV Mean Grad:      28.2 mmHg LVOT Vmax:         122.42 cm/s LVOT Vmean:        86.459 cm/s LVOT VTI:          0.283 m LVOT/AV VTI ratio: 0.44 AI PHT:            715 msec  AORTA Ao Root diam: 2.70 cm MITRAL VALVE               TRICUSPID VALVE MV Area (PHT): 2.81 cm    TR Peak grad:   68.2 mmHg MV Decel Time: 270 msec    TR Vmax:        413.00 cm/s MV E velocity: 74.70 cm/s MV A velocity: 96.10 cm/s  SHUNTS MV E/A ratio:  0.78        Systemic VTI:  0.28 m                            Systemic Diam: 1.90 cm Carlyle Dolly MD Electronically signed by Carlyle Dolly MD Signature Date/Time: 07/18/2021/4:39:22 PM    Final     Microbiology: Results for orders placed or performed during the hospital encounter of 07/17/21  MRSA Next Gen by PCR, Nasal     Status: None   Collection Time: 07/17/21  6:58 PM   Specimen: Nasal Mucosa; Nasal Swab  Result Value Ref Range Status   MRSA by PCR Next Gen NOT DETECTED NOT DETECTED Final    Comment: (NOTE) The GeneXpert MRSA Assay (FDA approved for NASAL specimens only), is one component of a comprehensive MRSA colonization surveillance program. It is not intended to diagnose MRSA infection nor to guide or monitor treatment for MRSA infections. Test performance is not FDA approved in patients less than 60 years old. Performed at [facility], [address], [place],  [place] [address]     Labs: CBC: Recent Labs  Lab 08/03/21 1404 08/03/21 2144 08/05/21 0359 08/06/21 0927 08/07/21 0818 08/08/21 0404 08/09/21 0355  WBC 7.6   < > 7.6 7.7 7.4 6.4 7.2  NEUTROABS 5.8  --  5.9  --   --   --   --   HGB 13.2   < > 11.3* 11.3* 11.7* 10.9* 10.5*  HCT 43.3   < > 35.6* 34.9* 36.7 34.2* 32.7*  MCV 89.3   < > 86.0 87.3 87.6 86.4 87.4  PLT 299   < > 310 342 402* 385 377   < > = values in this interval not displayed.   Basic Metabolic Panel: Recent Labs  Lab 08/03/21 1404 08/04/21 0303 08/05/21 0359 08/08/21 0404 08/09/21 0355  NA 144 140 141 141 142  K 4.5 5.3* 3.6 3.5 3.7  CL 107 107 106 110 110  CO2 27 23 26 24 24   GLUCOSE 123* 131* 125* 107* 100*  BUN 8 7* 5* 9 8  CREATININE 0.83 0.85 0.84 0.92 0.89  CALCIUM 9.5 8.8* 8.6* 8.7* 8.8*   Liver Function Tests: Recent Labs  Lab 08/03/21 1404  AST 17  ALT 21  ALKPHOS 83  BILITOT 0.3  PROT 7.3  ALBUMIN 3.9   CBG: Recent Labs  Lab 08/07/21 1557 08/08/21 0723 08/08/21 1131 08/08/21 1445 08/09/21 0607  GLUCAP 127* 101* 97 83 119*    Discharge time spent: 35 minutes.  Signed: Cordelia Poche, MD [facility] 08/09/2021

## 2021-08-09 NOTE — Discharge Instructions (Addendum)
Denise [name],  You were in the hospital with bleeding  over your abdominal muscles. This was related to your blood thinner usage. Thankfully, your bleeding appears to have stopped and you have been stable on blood thinners. You also had a liver biopsy and can follow-up with your primary oncologist for results. You have been transitioned to Eliquis for discharge to manage your previous pulmonary embolism. I have recommended for you to hold your amlodipine because your blood pressure has been on the lower end. Please check your blood pressure every day. If you notice your blood pressure is LESS than 100/70 (either number), please hold your Coreg dose. Please follow-up with your primary care physician.  Information on my medicine - ELIQUIS (apixaban)  This medication education was reviewed with me or my healthcare representative as part of my discharge preparation.    Why was Eliquis prescribed for you? Eliquis was prescribed to treat blood clots that may have been found in the veins of your legs (deep vein thrombosis) or in your lungs (pulmonary embolism) and to reduce the risk of them occurring again.  What do You need to know about Eliquis ? The starting dose  ONE 5 mg tablet taken TWICE daily.  Eliquis may be taken with or without food.   Try to take the dose about the same time in the morning and in the evening. If you have difficulty swallowing the tablet whole please discuss with your pharmacist how to take the medication safely.  Take Eliquis exactly as prescribed and DO NOT stop taking Eliquis without talking to the doctor who prescribed the medication.  Stopping may increase your risk of developing a new blood clot.  Refill your prescription before you run out.  After discharge, you should have regular check-up appointments with your healthcare provider that is prescribing your Eliquis.    What do you do if you miss a dose? If a dose of ELIQUIS is not taken at the scheduled time,  take it as soon as possible on the same day and twice-daily administration should be resumed. The dose should not be doubled to make up for a missed dose.  Important Safety Information A possible side effect of Eliquis is bleeding. You should call your healthcare provider right away if you experience any of the following: Bleeding from an injury or your nose that does not stop. Unusual colored urine (red or dark brown) or unusual colored stools (red or black). Unusual bruising for unknown reasons. A serious fall or if you hit your head (even if there is no bleeding).  Some medicines may interact with Eliquis and might increase your risk of bleeding or clotting while on Eliquis. To help avoid this, consult your healthcare provider or pharmacist prior to using any new prescription or non-prescription medications, including herbals, vitamins, non-steroidal anti-inflammatory drugs (NSAIDs) and supplements.  This website has more information on Eliquis (apixaban): http://www.eliquis.com/eliquis/home

## 2021-08-09 NOTE — TOC Benefit Eligibility Note (Signed)
Patient Teacher, English as a foreign language completed.    The patient is currently admitted and upon discharge could be taking Eliquis 5 mg.  The current 30 day co-pay is, $45.00.   The patient is insured through [employer], [place] Patient Advocate Specialist [place] Patient Advocate Team Direct Number: [phone]  Fax: [phone]

## 2021-08-09 NOTE — TOC Transition Note (Addendum)
Transition of Care [facility]) - CM/SW Discharge Note   Patient Details  Name: Denise [name] MRN: [id number] Date of Birth: Aug 17, 1950  [facility] CM/SW Contact:  Tom-Johnson, Renea Ee, RN Phone Number: 08/09/2021, 12:53 PM   Clinical Narrative:     Patient is scheduled for discharge today. Home health PT/OT/RN/Disease Management resumption of care with [facility] and info on AVS.  Denies any other needs.  Readmission Prevention high risk plan completed.  Family to transport at discharge. No further TOC needs noted.  Final next level of care: [place] Barriers to Discharge: Barriers Resolved   Patient Goals and CMS Choice Patient states their goals for this hospitalization and ongoing recovery are:: To return home CMS Medicare.gov Compare Post Acute Care list provided to:: Patient Choice offered to / list presented to : Patient  Discharge Placement                Patient to be transferred to facility by: Family      Discharge Plan and Services   Discharge Planning Services: CM Consult            DME Arranged: N/A DME Agency: NA       HH Arranged: PT, OT, RN, Disease Management [place] Agency: [place] ([place]) Date HH Agency Contacted: 08/07/21 Time [place]: 1026 Representative spoke with at [place]: [place] ([place]) Interventions     Readmission Risk Interventions     View : No data to display.

## 2021-08-09 NOTE — Progress Notes (Signed)
DISCHARGE NOTE HOME Denise [name] to be discharged Home per MD order. Discussed prescriptions and follow up appointments with the patient. Prescriptions given to patient; medication list explained in detail. Patient verbalized understanding.  Skin clean, dry and intact without evidence of skin break down, no evidence of skin tears noted. IV catheter discontinued intact. Site without signs and symptoms of complications. Dressing and pressure applied. Pt denies pain at the site currently. No complaints noted.  Patient free of lines, drains, and wounds.   An After Visit Summary (AVS) was printed and given to the patient. Patient escorted via wheelchair, and discharged home via private auto.  [place], Zenon Mayo, RN

## 2021-08-10 LAB — SURGICAL PATHOLOGY

## 2021-08-17 ENCOUNTER — Inpatient Hospital Stay (HOSPITAL_COMMUNITY): Payer: Medicare HMO | Attending: Hematology | Admitting: Hematology

## 2021-08-17 VITALS — BP 170/92 | HR 87 | Temp 98.1°F | Resp 18 | Wt 260.1 lb

## 2021-08-17 DIAGNOSIS — D320 Benign neoplasm of cerebral meninges: Secondary | ICD-10-CM | POA: Insufficient documentation

## 2021-08-17 DIAGNOSIS — C787 Secondary malignant neoplasm of liver and intrahepatic bile duct: Secondary | ICD-10-CM | POA: Diagnosis not present

## 2021-08-17 DIAGNOSIS — C3412 Malignant neoplasm of upper lobe, left bronchus or lung: Secondary | ICD-10-CM | POA: Diagnosis not present

## 2021-08-17 DIAGNOSIS — C3492 Malignant neoplasm of unspecified part of left bronchus or lung: Secondary | ICD-10-CM | POA: Diagnosis not present

## 2021-08-17 DIAGNOSIS — I2699 Other pulmonary embolism without acute cor pulmonale: Secondary | ICD-10-CM | POA: Diagnosis not present

## 2021-08-17 NOTE — Progress Notes (Signed)
Denise [name], [place] [address]   CLINIC:  Medical Oncology/Hematology  PCP:  Iona Beard, [address] / [place] [address] [phone]   REASON FOR VISIT:  Follow-up for stage III left lung adenocarcinoma  PRIOR THERAPY:  1. Chemoradiation with carboplatin and paclitaxel from 01/07/2019 to 02/11/2019. 2. Consolidation with durvalumab from 03/19/2019 to 04/16/2019, held due to pneumonitis.  NGS Results: not done  CURRENT THERAPY: Consolidation with durvalumab every 2 weeks; brain mets SRS in 5 fractions  BRIEF ONCOLOGIC HISTORY:  Oncology History  Adenocarcinoma of left lung ([facility])  12/09/2018 Initial Diagnosis   Adenocarcinoma of left lung ([facility])    01/02/2019 Cancer Staging   Staging form: Lung, AJCC 8th Edition - Clinical stage from 01/02/2019: Stage IIIB (cT3, cN2, cM0) - Signed by Derek Jack, MD on 01/02/2019    01/07/2019 - 02/11/2019 Chemotherapy   The patient had palonosetron (ALOXI) injection 0.25 mg, 0.25 mg, Intravenous,  Once, 6 of 6 cycles Administration: 0.25 mg (01/07/2019), 0.25 mg (01/14/2019), 0.25 mg (01/21/2019), 0.25 mg (01/28/2019), 0.25 mg (02/04/2019), 0.25 mg (02/11/2019) CARBOplatin (PARAPLATIN) 270 mg in sodium chloride 0.9 % 250 mL chemo infusion, 270 mg (100 % of original dose 266.4 mg), Intravenous,  Once, 6 of 6 cycles Dose modification:   (original dose 266.4 mg, Cycle 1),   (original dose 266.4 mg, Cycle 2),   (original dose 266.4 mg, Cycle 3),   (original dose 266.4 mg, Cycle 4) Administration: 270 mg (01/07/2019), 270 mg (01/14/2019), 270 mg (01/21/2019), 270 mg (01/28/2019), 270 mg (02/04/2019), 270 mg (02/11/2019) PACLitaxel (TAXOL) 108 mg in sodium chloride 0.9 % 250 mL chemo infusion (</= 80mg /m2), 45 mg/m2 = 108 mg, Intravenous,  Once, 6 of 6 cycles Administration: 108 mg (01/07/2019), 108 mg (01/14/2019), 108 mg (01/21/2019), 108 mg (01/28/2019), 108 mg (02/04/2019), 108 mg  (02/11/2019) fosaprepitant (EMEND) 150 mg, dexamethasone (DECADRON) 12 mg in sodium chloride 0.9 % 145 mL IVPB, , Intravenous,  Once, 5 of 5 cycles Administration:  (01/14/2019),  (01/21/2019),  (01/28/2019),  (02/04/2019),  (02/11/2019)   for chemotherapy treatment.     03/19/2019 -  Chemotherapy    Patient is on Treatment Plan: LUNG DURVALUMAB Q14D         CANCER STAGING:  Cancer Staging  Adenocarcinoma of left lung [facility]) Staging form: Lung, AJCC 8th Edition - Clinical stage from 01/02/2019: Stage IIIB (cT3, cN2, cM0) - Signed by Derek Jack, MD on 01/02/2019   INTERVAL HISTORY:  Denise [name], a 71 y.o. female, returns for routine follow-up of her stage III left lung adenocarcinoma. Denise [name] was last seen on 04/28/2021.   Today she reports feeling well, and she is accompanied by her niece. She reports she had left sided abdominal pain, but she denies current pain. She reports SOB with exertion and reports she does not have SOB when seated. She denies hematuria and hematochezia.   REVIEW OF SYSTEMS:  Review of Systems  Constitutional:  Negative for appetite change.  Respiratory:  Positive for shortness of breath (with exertion).   Gastrointestinal:  Positive for abdominal pain (4/10 L side). Negative for blood in stool.  Genitourinary:  Negative for hematuria.   All other systems reviewed and are negative.  PAST MEDICAL/SURGICAL HISTORY:  Past Medical History:  Diagnosis Date   Anemia    Aortic stenosis    Arthritis    Cancer (HCC)    Lung   Coronary artery calcification seen on CT scan  Essential hypertension    GERD (gastroesophageal reflux disease)    History of lung cancer    Stage III adenocarcinoma status post chemoradiation   Port-A-Cath in place 01/06/2019   Type 2 diabetes mellitus [facility])    Past Surgical History:  Procedure Laterality Date   CHOLECYSTECTOMY  1997   COLONOSCOPY     GASTRIC BYPASS     INCISIONAL HERNIA REPAIR  04/11/11   IR  IMAGING GUIDED PORT INSERTION  12/27/2018   Right   IR US GUIDE BX ASP/DRAIN  08/08/2021   LAPAROSCOPIC SALPINGOOPHERECTOMY     LAPAROTOMY  04/11/2011   Procedure: EXPLORATORY LAPAROTOMY;  Surgeon: Joyice Faster. Cornett, MD;  Location: [facility];  Service: General;  Laterality: N/A;  closure port hole   RIGHT/LEFT HEART CATH AND CORONARY ANGIOGRAPHY N/A 12/29/2019   Procedure: RIGHT/LEFT HEART CATH AND CORONARY ANGIOGRAPHY;  Surgeon: Troy Sine, MD;  Location: [facility];  Service: Cardiovascular;  Laterality: N/A;   TOTAL HIP ARTHROPLASTY  03/07/2012   Procedure: TOTAL HIP ARTHROPLASTY ANTERIOR APPROACH;  Surgeon: Mauri Pole, MD;  Location: [facility];  Service: Orthopedics;  Laterality: Right;   TOTAL SHOULDER ARTHROPLASTY Left 01/21/2015   TOTAL SHOULDER ARTHROPLASTY Left 01/21/2015   Procedure: LEFT TOTAL SHOULDER ARTHROPLASTY;  Surgeon: Justice Britain, MD;  Location: [facility];  Service: Orthopedics;  Laterality: Left;   VAGINAL HYSTERECTOMY      SOCIAL HISTORY:  Social History   Socioeconomic History   Marital status: Single    Spouse name: Not on file   Number of children: Not on file   Years of education: 12th grade   Highest education level: Not on file  Occupational History   Occupation: Employed    Employer: [employer]  Tobacco Use   Smoking status: Former    Packs/day: 1.00    Years: 12.00    Pack years: 12.00    Types: Cigarettes    Quit date: 03/20/1976    Years since quitting: 45.4   Smokeless tobacco: Never  Vaping Use   Vaping Use: Never used  Substance and Sexual Activity   Alcohol use: No   Drug use: No   Sexual activity: Not Currently  Other Topics Concern   Not on file  Social History Narrative   Not on file   Social Determinants of Health   Financial Resource Strain: Not on file  Food Insecurity: Not on file  Transportation Needs: Not on file  Physical Activity: Not on file  Stress: Not on file  Social Connections: Not on file   Intimate Partner Violence: Not on file    FAMILY HISTORY:  Family History  Problem Relation Age of Onset   Breast cancer Mother    COPD Mother    Arthritis Mother    Diabetes Mother    Hypertension Mother    Hypertension Father    Diabetes Father    Breast cancer Sister    Thyroid cancer Brother    Huntington's disease Maternal Grandmother    Heart attack Brother     CURRENT MEDICATIONS:  Current Outpatient Medications  Medication Sig Dispense Refill   albuterol (VENTOLIN HFA) 108 (90 Base) MCG/ACT inhaler TAKE 2 PUFFS BY MOUTH EVERY 6 HOURS AS NEEDED FOR WHEEZE OR SHORTNESS OF BREATH (Patient taking differently: Inhale 2 puffs into the lungs every 6 (six) hours as needed for wheezing or shortness of breath.) 18 each 6   apixaban (ELIQUIS) 5 MG TABS tablet Take 1 tablet (5 mg total)  by mouth 2 (two) times daily. 60 tablet 2   Calcium Carbonate Antacid (TUMS PO) Take 4 tablets by mouth daily.     carvedilol (COREG) 3.125 MG tablet Take 1 tablet (3.125 mg total) by mouth 2 (two) times daily. 180 tablet 0   Cyanocobalamin (VITAMIN B-12) 2500 MCG SUBL Place 2,500 mcg under the tongue every morning.      diphenhydrAMINE (BENADRYL) 25 MG tablet Take 25 mg by mouth every 6 (six) hours as needed for itching or allergies.     ferrous sulfate 325 (65 FE) MG tablet Take 325 mg by mouth every evening.     furosemide (LASIX) 20 MG tablet Take 20 mg by mouth daily.     gabapentin (NEURONTIN) 300 MG capsule Take 300 mg by mouth at bedtime as needed (Nerve Pain).     HYDROcodone-acetaminophen (NORCO/VICODIN) 5-325 MG tablet Take 1 tablet by mouth every 8 (eight) hours as needed. for pain     Multiple Vitamin (MULITIVITAMIN WITH MINERALS) TABS Take 1 tablet by mouth daily.     omeprazole (PRILOSEC) 20 MG capsule Take 20 mg by mouth daily.     Potassium Chloride ER 20 MEQ TBCR Take 1 tablet by mouth daily.     No current facility-administered medications for this visit.    ALLERGIES:  No  Known Allergies  PHYSICAL EXAM:  Performance status (ECOG): 1 - Symptomatic but completely ambulatory  Vitals:   08/17/21 1307  BP: (!) 170/92  Pulse: 87  Resp: 18  Temp: 98.1 F (36.7 C)  SpO2: 95%   Wt Readings from Last 3 Encounters:  08/17/21 260 lb 1.6 oz (118 kg)  08/04/21 257 lb 4.4 oz (116.7 kg)  07/18/21 259 lb 7.7 oz (117.7 kg)   Physical Exam Vitals reviewed.  Constitutional:      Appearance: Normal appearance. She is obese.  Cardiovascular:     Rate and Rhythm: Normal rate and regular rhythm.     Pulses: Normal pulses.     Heart sounds: Murmur heard.  Systolic murmur is present.  Pulmonary:     Effort: Pulmonary effort is normal.     Breath sounds: Normal breath sounds.  Neurological:     General: No focal deficit present.     Mental Status: She is alert and oriented to person, place, and time.  Psychiatric:        Mood and Affect: Mood normal.        Behavior: Behavior normal.     LABORATORY DATA:  I have reviewed the labs as listed.     Latest Ref Rng & Units 08/09/2021    3:55 AM 08/08/2021    4:04 AM 08/07/2021    8:18 AM  CBC  WBC 4.0 - 10.5 K/uL 7.2   6.4   7.4    Hemoglobin 12.0 - 15.0 g/dL 10.5   10.9   11.7    Hematocrit 36.0 - 46.0 % 32.7   34.2   36.7    Platelets 150 - 400 K/uL 377   385   402        Latest Ref Rng & Units 08/09/2021    3:55 AM 08/08/2021    4:04 AM 08/05/2021    3:59 AM  CMP  Glucose 70 - 99 mg/dL 100   107   125    BUN 8 - 23 mg/dL 8   9   5     Creatinine 0.44 - 1.00 mg/dL 0.89   0.92   0.84  Sodium 135 - 145 mmol/L 142   141   141    Potassium 3.5 - 5.1 mmol/L 3.7   3.5   3.6    Chloride 98 - 111 mmol/L 110   110   106    CO2 22 - 32 mmol/L 24   24   26     Calcium 8.9 - 10.3 mg/dL 8.8   8.7   8.6      DIAGNOSTIC IMAGING:  I have independently reviewed the scans and discussed with the patient. MR ABDOMEN W WO CONTRAST  Result Date: 08/05/2021 CLINICAL DATA:  Left abdominal pain. Liver lesion seen on recent  CT. Lung cancer. EXAM: MRI ABDOMEN WITHOUT AND WITH CONTRAST TECHNIQUE: Multiplanar multisequence MR imaging of the abdomen was performed both before and after the administration of intravenous contrast. CONTRAST:  32mL GADAVIST GADOBUTROL 1 MMOL/ML IV SOLN COMPARISON:  CT on 08/03/2021 FINDINGS: Lower chest: No acute findings. Hepatobiliary: A bilobed mass versus 2 adjacent masses are seen in the right hepatic lobe which measure 3.7 x 3.7 cm and 4.3 x 2.5 cm. These show areas of central necrosis and are highly suspicious for liver metastases. A benign appearing cyst is seen in segment 3 of the left lobe. In addition, there are other tiny sub-cm lesions which are difficult to characterize due to their small size, but show evidence of mild peripheral rim enhancement. Early liver metastases cannot be excluded. Prior cholecystectomy again noted. Diffuse biliary ductal dilatation remains stable, and there is no evidence of choledocholithiasis. Pancreas:  No mass or inflammatory changes. Spleen:  Within normal limits in size and appearance. Adrenals/Urinary Tract: No masses identified. No evidence of hydronephrosis. Stomach/Bowel: Colonic diverticulosis noted, without evidence of diverticulitis. Vascular/Lymphatic: No pathologically enlarged lymph nodes identified. No acute vascular findings. Other:  None. Musculoskeletal:  No suspicious bone lesions identified. IMPRESSION: Bilobed mass versus 2 adjacent masses in the right hepatic lobe, highly suspicious for liver metastases. Several tiny sub-cm lesions are too small to characterize, but some show evidence of mild peripheral rim enhancement raising suspicion for additional tiny liver metastases. Prior cholecystectomy. Stable diffuse biliary ductal dilatation, without evidence of choledocholithiasis or other obstructing etiology. Ampullary stenosis cannot be excluded by imaging. Electronically Signed   By: Marlaine Hind M.D.   On: 08/05/2021 11:41   CT ABDOMEN PELVIS W  CONTRAST  Result Date: 08/03/2021 CLINICAL DATA:  A 71 year old female presents for evaluation of LEFT lower quadrant abdominal pain. Pain radiates to the groin and began yesterday. * Tracking Code: BO * w EXAM: CT ABDOMEN AND PELVIS WITH CONTRAST TECHNIQUE: Multidetector CT imaging of the abdomen and pelvis was performed using the standard protocol following bolus administration of intravenous contrast. RADIATION DOSE REDUCTION: This exam was performed according to the departmental dose-optimization program which includes automated exposure control, adjustment of the mA and/or kV according to patient size and/or use of iterative reconstruction technique. CONTRAST:  130mL OMNIPAQUE IOHEXOL 300 MG/ML  SOLN COMPARISON:  April 11, 2011.  July 17, 2021 chest CT. FINDINGS: Lower chest: Lung bases are clear aside from mild atelectatic changes. Hepatobiliary: Marked dilation of the biliary tree 16 mm caliber of the common bile duct increased from 12-13 mm in January of 2023. Intrahepatic biliary duct distension also present. Lesion as large as 3.7 x 3.4 cm in the RIGHT hepatic lobe inferior to a 17 mm lesion in hepatic subsegment V no additional hepatic lesions./VIII (image 19/2) Pancreas: Normal, without mass, inflammation or ductal dilatation. Spleen: Normal. Adrenals/Urinary Tract: Adrenal  glands are normal. Symmetric renal enhancement without hydronephrosis. No perivesical stranding. No suspicious renal lesion. Stomach/Bowel: Post gastric bypass. No small bowel obstruction. Small bowel loops contained in a ventral hernia with wide mouth. The appendix is normal. There is extensive colonic diverticulosis without acute colonic process. Vascular/Lymphatic: No adenopathy in the retroperitoneum. No adenopathy in the pelvis. Reproductive: Fibroid uterus densely calcified fibroid. Other: Large rectus sheath hematoma extending to the symphysis pubis on the LEFT compatible with intramuscular hematoma in the setting of  ongoing anticoagulation. Expands the lower margin of the muscle. No discrete site of extravasation but with mixed attenuation, a finding that can be seen in the setting of ongoing bleeding. Hematoma measuring in the sagittal plane approximately 12.4 x 4.8 cm. Does not currently across the midline. Stranding about the hematoma. Musculoskeletal: No acute bone finding. No destructive bone process. Spinal degenerative changes. Signs of RIGHT hip arthroplasty with resultant streak artifact. IMPRESSION: 1. Large LEFT rectus sheath hematoma extending to the symphysis pubis on the LEFT compatible with intramuscular hematoma. No discrete site of extravasation but with mixed attenuation, a finding that can be seen in the setting of ongoing bleeding. Hematoma measuring 12 cm greatest dimension. 2. Suspected new hepatic metastatic disease and potential lesion as large as 3.7 cm in the RIGHT hepatic lobe. Follow-up liver MRI with and without Eovist contrast may be helpful. At this time increasing biliary duct distension could also be evaluated. This would be best performed as an outpatient when the patient is better able to hold their breath and cooperate with this examination, at outside of the acute setting. 3. Post gastric bypass. 4. Nonobstructing large umbilical hernia containing small bowel loops. Findings of large rectus sheath hematoma were called to the physician caring for the patient as outlined below. These results were called by telephone at the time of interpretation on 08/03/2021 at 5:05 pm to provider Dr. Sabra Heck, Who verbally acknowledged these results. Electronically Signed   By: Zetta Bills M.D.   On: 08/03/2021 17:06   IR US Guide Bx Asp/Drain  Result Date: 08/08/2021 INDICATION: Multiple right liver masses EXAM: Ultrasound-guided biopsy of right liver mass MEDICATIONS: None. ANESTHESIA/SEDATION: Moderate (conscious) sedation was employed during this procedure. A total of Versed 1.5 mg and Fentanyl 50  mcg was administered intravenously by the radiology nurse. Total intra-service moderate Sedation Time: 8 minutes. The patient's level of consciousness and vital signs were monitored continuously by radiology nursing throughout the procedure under my direct supervision. COMPLICATIONS: None immediate. PROCEDURE: Informed written consent was obtained from the patient after a thorough discussion of the procedural risks, benefits and alternatives. All questions were addressed. Maximal Sterile Barrier Technique was utilized including caps, mask, sterile gowns, sterile gloves, sterile drape, hand hygiene and skin antiseptic. A timeout was performed prior to the initiation of the procedure. Patient position supine on the ultrasound table. Right upper quadrant skin prepped and draped in usual sterile fashion. Following local lidocaine administration, 17 gauge introducer needle was advanced into 1 of the right hepatic lesions, and 4- 18 gauge cores were obtained utilizing continuous ultrasound guidance. Gelfoam slurry was administered through the introducer needle at the biopsy site. Samples were sent to pathology in formalin. Needle removed and hemostasis achieved with 5 minutes of manual compression. Post procedure ultrasound images showed no evidence of significant hemorrhage. IMPRESSION: Ultrasound-guided biopsy of right liver mass. Electronically Signed   By: Miachel Roux M.D.   On: 08/08/2021 16:21   ECHOCARDIOGRAM COMPLETE  Result Date: 07/18/2021  ECHOCARDIOGRAM REPORT   Patient Name:   ALLISYN KUNZ Date of Exam: 07/18/2021 Medical Rec #:  [id number]   Height:       62.0 in Accession #:    [id number]  Weight:       259.5 lb Date of Birth:  May 16, 1950    BSA:          2.135 m Patient Age:    72 years    BP:           107/65 mmHg Patient Gender: F           HR:           76 bpm. Exam Location:  [facility] Procedure: 2D Echo, Color Doppler and Cardiac Doppler Indications:    Dyspnea  History:        Patient has prior  history of Echocardiogram examinations, most                 recent 08/03/2020. Risk Factors:Diabetes, Hypertension and Former                 Smoker. Pulmonary Embolus; Lung Cancer.  Sonographer:    Leavy Cella RDCS Referring Phys: [id number] [place]  1. Left ventricular ejection fraction, by estimation, is 65 to 70%. The left ventricle has normal function. Left ventricular endocardial border not optimally defined to evaluate regional wall motion. There is severe left ventricular hypertrophy. Left ventricular diastolic parameters are consistent with Grade I diastolic dysfunction (impaired relaxation).  2. Ventricular septum is flattened in systole consistent with RV pressure overload. . Right ventricular systolic function moderately to severely reduced. The right ventricular size is severely enlarged. There is severely elevated pulmonary artery systolic pressure.  3. Right atrial size was severely dilated.  4. The mitral valve was not well visualized. No evidence of mitral valve regurgitation. No evidence of mitral stenosis.  5. The tricuspid valve is abnormal. Tricuspid valve regurgitation is moderate.  6. The aortic valve has an indeterminant number of cusps. There is moderate calcification of the aortic valve. There is moderate thickening of the aortic valve. Aortic valve regurgitation is mild. Moderate aortic valve stenosis. Aortic valve mean gradient measures 28.2 mmHg. Aortic valve peak gradient measures 46.9 mmHg. Aortic valve area, by VTI measures 1.25 cm.  7. The inferior vena cava is normal in size with greater than 50% respiratory variability, suggesting right atrial pressure of 3 mmHg. FINDINGS  Left Ventricle: Left ventricular ejection fraction, by estimation, is 65 to 70%. The left ventricle has normal function. Left ventricular endocardial border not optimally defined to evaluate regional wall motion. The left ventricular internal cavity size was normal in size. There is  severe left ventricular hypertrophy. Left ventricular diastolic parameters are consistent with Grade I diastolic dysfunction (impaired relaxation). Normal left ventricular filling pressure. Right Ventricle: Ventricular septum is flattened in systole consistent with RV pressure overload. The right ventricular size is severely enlarged. Right vetricular wall thickness was not well visualized. Right ventricular systolic function moderately to severely reduced. There is severely elevated pulmonary artery systolic pressure. The tricuspid regurgitant velocity is 4.13 m/s, and with an assumed right atrial pressure of 3 mmHg, the estimated right ventricular systolic pressure is 09.3 mmHg. Left Atrium: Left atrial size was normal in size. Right Atrium: Right atrial size was severely dilated. Pericardium: There is no evidence of pericardial effusion. Mitral Valve: The mitral valve was not well visualized. There is mild thickening of the mitral valve leaflet(s). There is mild  calcification of the mitral valve leaflet(s). Mild mitral annular calcification. No evidence of mitral valve regurgitation. No evidence of mitral valve stenosis. Tricuspid Valve: The tricuspid valve is abnormal. Tricuspid valve regurgitation is moderate . No evidence of tricuspid stenosis. Aortic Valve: The aortic valve has an indeterminant number of cusps. There is moderate calcification of the aortic valve. There is moderate thickening of the aortic valve. There is moderate aortic valve annular calcification. Aortic valve regurgitation is mild. Aortic regurgitation PHT measures 715 msec. Moderate aortic stenosis is present. Aortic valve mean gradient measures 28.2 mmHg. Aortic valve peak gradient measures 46.9 mmHg. Aortic valve area, by VTI measures 1.25 cm. Pulmonic Valve: The pulmonic valve was not well visualized. Pulmonic valve regurgitation is not visualized. No evidence of pulmonic stenosis. Aorta: The aortic root is normal in size and  structure. Venous: The inferior vena cava is normal in size with greater than 50% respiratory variability, suggesting right atrial pressure of 3 mmHg. IAS/Shunts: No atrial level shunt detected by color flow Doppler.  LEFT VENTRICLE PLAX 2D LVIDd:         3.60 cm   Diastology LVIDs:         2.10 cm   LV e' medial:    5.00 cm/s LV PW:         1.60 cm   LV E/e' medial:  14.9 LV IVS:        1.40 cm   LV e' lateral:   6.20 cm/s LVOT diam:     1.90 cm   LV E/e' lateral: 12.0 LV SV:         80 LV SV Index:   38 LVOT Area:     2.84 cm  RIGHT VENTRICLE RV Basal diam:  5.70 cm RV Mid diam:    4.40 cm RV S prime:     9.57 cm/s TAPSE (M-mode): 2.0 cm LEFT ATRIUM             Index        RIGHT ATRIUM           Index LA diam:        3.00 cm 1.40 cm/m   RA Area:     23.10 cm LA Vol (A2C):   56.0 ml 26.22 ml/m  RA Volume:   93.90 ml  43.97 ml/m LA Vol (A4C):   36.7 ml 17.19 ml/m LA Biplane Vol: 48.0 ml 22.48 ml/m  AORTIC VALVE AV Area (Vmax):    1.01 cm AV Area (Vmean):   0.98 cm AV Area (VTI):     1.25 cm AV Vmax:           342.43 cm/s AV Vmean:          249.663 cm/s AV VTI:            0.643 m AV Peak Grad:      46.9 mmHg AV Mean Grad:      28.2 mmHg LVOT Vmax:         122.42 cm/s LVOT Vmean:        86.459 cm/s LVOT VTI:          0.283 m LVOT/AV VTI ratio: 0.44 AI PHT:            715 msec  AORTA Ao Root diam: 2.70 cm MITRAL VALVE               TRICUSPID VALVE MV Area (PHT): 2.81 cm    TR Peak grad:   68.2 mmHg  MV Decel Time: 270 msec    TR Vmax:        413.00 cm/s MV E velocity: 74.70 cm/s MV A velocity: 96.10 cm/s  SHUNTS MV E/A ratio:  0.78        Systemic VTI:  0.28 m                            Systemic Diam: 1.90 cm Carlyle Dolly MD Electronically signed by Carlyle Dolly MD Signature Date/Time: 07/18/2021/4:39:22 PM    Final      ASSESSMENT:  1.  Adenocarcinoma of left lung ([facility]) -Chemoradiation therapy with carboplatin and paclitaxel from 01/07/2019 through 02/11/2019. -Consolidation immunotherapy with  durvalumab from 03/19/2019 through 04/15/2018, held due to pneumonitis. -CT chest on 07/02/2019 shows left upper lobe lung mass measuring 2.6 x 2.0 cm.  It shows improvement in size. -MRI of the brain on 07/18/2019 showed left sphenoid wing meningioma measuring 2.6 x 2.0 x 2.6 cm unchanged.  No new enhancing intracranial lesion.  Increased left temporal white matter edema. -Durvalumab restarted on 07/24/2019. -We reviewed CT chest with contrast from 09/29/2019 which showed stable appearance of treated lung lesion within the posterior left upper lobe.  New bandlike area of increased soft tissue density within the anterior to the treated lung lesion favored to be progressive changes from radiation.  Local tumor recurrence is less favored.  Stable pericardial effusion. -CT chest on 12/10/2019 shows posttreatment appearance of the left lung with associated scarring and volume loss.  No evidence of recurrence or metastatic disease.  Unchanged small pericardial effusion. -She was evaluated by cardiology with a stress test.  EF was 46%.  She underwent cardiac catheterization which did not show any abnormalities. - 1 year of durvalumab completed on 07/23/2020.   PLAN:  1.  Metastatic adenocarcinoma of the lung to the liver: - She was recently hospitalized with abdominal wall bleeding while on Lovenox. - CTAP showed liver lesion, which was later confirmed with MRI. - I have reviewed records during hospitalization.  I have reviewed scans with the patient and her niece. - Reviewed liver biopsy from 08/08/2021: Adenocarcinoma with IHC positive for CK7.  Negative for TTF-1, Napsin a and CDX2. - This is most likely consistent with adenocarcinoma metastatic from her lung primary.  However I have recommended PET CT scan.  I have also recommended NGS testing with [facility].  We have also discussed various options based on metastatic disease from lung primary. - RTC 2 weeks for follow-up.   2.  Meningioma: - Continue follow-up  with neurosurgery with MRIs.  3.  Unprovoked pulmonary embolism: - Diagnosed on a CT chest on 07/17/2021: At least submassive intermediate risk PE. - Developed rectus sheath hematoma while on Lovenox. - Her symptoms have improved.  Saturation is 95% on room air.  Continue Eliquis 5 mg twice daily indefinitely.     Orders placed this encounter:  Orders Placed This Encounter  Procedures   NM PET Image Restag (PS) Skull Base To Thigh     Derek Jack, MD [place] [phone]   I, Thana Ates, am acting as a scribe for Dr. Derek Jack.  I, Derek Jack MD, have reviewed the above documentation for accuracy and completeness, and I agree with the above.

## 2021-08-17 NOTE — Patient Instructions (Addendum)
[place] at [facility] Discharge Instructions  You were seen and examined today by Dr. Delton Coombes.  Dr. Delton Coombes discussed your recent hospitalization, including biopsy and CT scan. Your recent CT scan revealed a new spot on your liver which was biopsied and consistent with adenocarcinoma (cancer). This is the same type of cancer that was present within your lung.  This most likely means that your lung cancer has recurred and spread to your liver. Dr. Delton Coombes has recommended a PET scan to see if there are any other areas of concern within your body.  Dr. Delton Coombes has also recommended a molecular testing of the tissue collected during your biopsy. There is nothing further for you to do for this testing.   Continue the blood thinner as you have been doing.  Follow-up as scheduled.    Thank you for choosing [place] at [facility] to provide your oncology and hematology care.  To afford each patient quality time with our provider, please arrive at least 15 minutes before your scheduled appointment time.   If you have a lab appointment with the [facility] please come in thru the Main Entrance and check in at the main information desk.  You need to re-schedule your appointment should you arrive 10 or more minutes late.  We strive to give you quality time with our providers, and arriving late affects you and other patients whose appointments are after yours.  Also, if you no show three or more times for appointments you may be dismissed from the clinic at the providers discretion.     Again, thank you for choosing [facility].  Our hope is that these requests will decrease the amount of time that you wait before being seen by our physicians.       _____________________________________________________________  Should you have questions after your visit to [facility], please contact our office at [phone]  and follow the prompts.  Our office hours are 8:00 a.m. and 4:30 p.m. Monday - Friday.  Please note that voicemails left after 4:00 p.m. may not be returned until the following business day.  We are closed weekends and major holidays.  You do have access to a nurse 24-7, just call the main number to the clinic [phone] and do not press any options, hold on the line and a nurse will answer the phone.    For prescription refill requests, have your pharmacy contact our office and allow 72 hours.    Due to Covid, you will need to wear a mask upon entering the hospital. If you do not have a mask, a mask will be given to you at the Main Entrance upon arrival. For doctor visits, patients may have 1 support person age 41 or older with them. For treatment visits, patients can not have anyone with them due to social distancing guidelines and our immunocompromised population.

## 2021-08-18 ENCOUNTER — Encounter (HOSPITAL_COMMUNITY): Payer: Self-pay

## 2021-08-18 NOTE — Progress Notes (Signed)
[place] testing requested on [phone] per Dr. Delton Coombes

## 2021-08-25 ENCOUNTER — Telehealth (HOSPITAL_COMMUNITY): Payer: Self-pay

## 2021-08-25 ENCOUNTER — Other Ambulatory Visit (HOSPITAL_COMMUNITY): Payer: Self-pay

## 2021-08-25 ENCOUNTER — Encounter (HOSPITAL_COMMUNITY)
Admission: RE | Admit: 2021-08-25 | Discharge: 2021-08-25 | Disposition: A | Discharge status: Home / Self Care | Payer: Medicare HMO | Source: Ambulatory Visit | Attending: Hematology | Admitting: Hematology

## 2021-08-25 DIAGNOSIS — K7689 Other specified diseases of liver: Secondary | ICD-10-CM | POA: Diagnosis not present

## 2021-08-25 DIAGNOSIS — C3492 Malignant neoplasm of unspecified part of left bronchus or lung: Secondary | ICD-10-CM | POA: Diagnosis not present

## 2021-08-25 DIAGNOSIS — I2699 Other pulmonary embolism without acute cor pulmonale: Secondary | ICD-10-CM | POA: Diagnosis not present

## 2021-08-25 DIAGNOSIS — C3411 Malignant neoplasm of upper lobe, right bronchus or lung: Secondary | ICD-10-CM | POA: Diagnosis not present

## 2021-08-25 DIAGNOSIS — C3490 Malignant neoplasm of unspecified part of unspecified bronchus or lung: Secondary | ICD-10-CM | POA: Diagnosis not present

## 2021-08-25 DIAGNOSIS — I2510 Atherosclerotic heart disease of native coronary artery without angina pectoris: Secondary | ICD-10-CM | POA: Diagnosis not present

## 2021-08-25 IMAGING — PT NM PET TUM IMG RESTAG (PS) SKULL BASE T - THIGH
1 of 8 series · 1 of 25 positions shown · non-contrast
Comparison: MRI abdomen [DATE], CT chest [DATE]

CLINICAL DATA: Subsequent treatment strategy for non-small cell
lung cancer.

EXAM:
NUCLEAR MEDICINE PET SKULL BASE TO THIGH
TECHNIQUE: 12.45 mCi F-18 FDG was injected intravenously. Full-ring PET imaging
was performed from the skull base to thigh after the radiotracer. CT
data was obtained and used for attenuation correction and anatomic
localization.
Fasting blood glucose: 113 mg/dl

[Series 3: ctac · axial · 3.0mm · 0.98mm/px · 1 of 293 slices shown]
[im 293/293  brain]
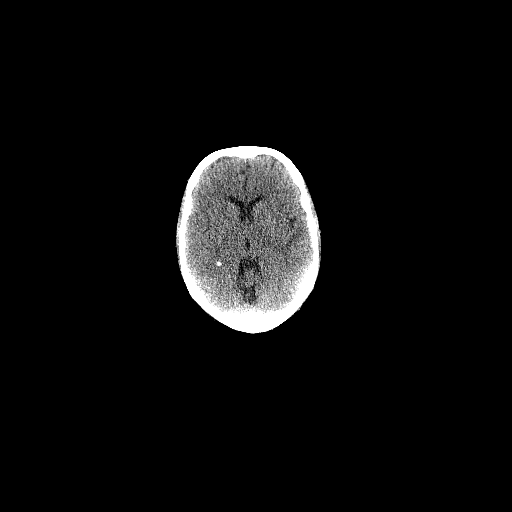

[1 of 25 positions shown; findings below may reference images not displayed]

FINDINGS: Mediastinal blood pool activity: SUV max

Liver activity: SUV max NA

NECK: No hypermetabolic lymph nodes in the neck.

Incidental CT findings: none

CHEST: No tracer avid mediastinal, hilar, axillary, or
supraclavicular lymph nodes. The masslike architectural distortion,
volume loss and fibrosis within the paramediastinal left lung is
again noted without signs of residual/recurrent FDG avid tumor.

-Within the apical segment of the right upper lobe there is a lung
nodule measuring 9 mm within SUV max of 5.0, image 59/3. This is new
compared with [DATE] and [DATE].

- There is a ground-glass and solid nodule within the periphery of
the right upper lobe measures 8 mm with an SUV max of 2.45. On the
previous exam there was a ill-defined area of ground-glass
attenuation attributed to pulmonary infarct in this area measuring
2.5 cm.

Incidental CT findings: Scattered areas of ground-glass attenuation
within the right upper lobe and right middle lobe are identified.
This is nonspecific and may be seen with inflammation or infection.
This could also represent residua of recent pulmonary embolus.
Cardiac enlargement with aortic atherosclerosis and coronary artery
calcifications.

ABDOMEN/PELVIS: Within the lateral right lobe of liver there is a
mass which measures approximately 4.5 x 3.6 cm within SUV max of
7.78, image 129/3. This corresponds to the suspicious lesion
characterized on recent MRI.

No abnormal tracer uptake identified within the pancreas, spleen or
adrenal glands. The no tracer avid abdominopelvic lymph nodes.

Incidental CT findings: Aortic atherosclerosis. Cholecystectomy.
Postop changes involving the stomach and small bowel loops. Moderate
size periumbilical hernia contains nonobstructed loops of small
bowel.

SKELETON:

No signs tracer avid bone metastases.

Incidental CT findings:

There is mild increased radiotracer uptake localizing to previously
noted intramuscular hematoma involving the left lower rectus muscle.
The hematoma has decreased in size measuring 6.1 x 3.9 cm, image
205/3. Previously this measured 11.5 x 8.1 cm.

Scattered, subcutaneous soft tissue nodules within the ventral
abdominal wall exhibit mild increased tracer uptake with SUV max
2.89, image 191/3. These are favored to represent injection sites.
IMPRESSION: 1. Recently characterized by MRI suspicious mass in the right
hepatic lobe is FDG avid and worrisome for metastatic disease.
2. There are 2 new nodules within the right upper lobe which exhibit
mild to moderate increased tracer uptake. These are both new from
[DATE]. The most suspicious nodule is in the apex which appears
more solid and has an SUV max of 5.0. Metastatic disease cannot be
excluded. Mixed ground-glass and solid nodule within the periphery
of the right upper lobe exhibits mild uptake with SUV max of 2.45.
This is in an area of previously characterized pulmonary infarct and
may reflect inflammatory changes secondary to resolving infarct.
Interval surveillance imaging of both nodules is advised to assess
for any temporal change.
3. Stable post radiation changes within the perihilar left lung
without signs of tracer avid locally recurrent tumor.
4. Interval decrease in size of previous left lower rectus muscle
hematoma.

## 2021-08-25 MED ORDER — FLUDEOXYGLUCOSE F - 18 (FDG) INJECTION
12.4500 | Freq: Once | INTRAVENOUS | Status: AC | PRN
  Administered 2021-08-25: 12.45 via INTRAVENOUS

## 2021-08-25 NOTE — Telephone Encounter (Signed)
Transitions of Care Pharmacy  ° °Call attempted for a pharmacy transitions of care follow-up. HIPAA appropriate voicemail was left with call back information provided.  ° °Call attempt #1. Will follow-up in 2-3 days.  °  °

## 2021-08-27 ENCOUNTER — Other Ambulatory Visit: Payer: Self-pay | Admitting: Nurse Practitioner

## 2021-08-31 ENCOUNTER — Other Ambulatory Visit (HOSPITAL_COMMUNITY): Payer: Self-pay | Admitting: Neurosurgery

## 2021-08-31 ENCOUNTER — Other Ambulatory Visit: Payer: Self-pay | Admitting: Neurosurgery

## 2021-08-31 DIAGNOSIS — D329 Benign neoplasm of meninges, unspecified: Secondary | ICD-10-CM

## 2021-09-01 ENCOUNTER — Other Ambulatory Visit (HOSPITAL_COMMUNITY): Payer: Self-pay

## 2021-09-02 ENCOUNTER — Telehealth (HOSPITAL_COMMUNITY): Payer: Self-pay

## 2021-09-02 NOTE — Telephone Encounter (Signed)
Transitions of Care Pharmacy   Call attempted for a pharmacy transitions of care follow-up. HIPAA appropriate voicemail was left with call back information provided.   Call attempt #2. Will follow-up in 2-3 days.    

## 2021-09-07 ENCOUNTER — Inpatient Hospital Stay (HOSPITAL_COMMUNITY): Payer: Medicare HMO | Attending: Hematology | Admitting: Hematology

## 2021-09-07 ENCOUNTER — Telehealth (HOSPITAL_COMMUNITY): Payer: Self-pay | Admitting: Pharmacist

## 2021-09-07 ENCOUNTER — Inpatient Hospital Stay (HOSPITAL_COMMUNITY): Payer: Medicare HMO

## 2021-09-07 ENCOUNTER — Other Ambulatory Visit (HOSPITAL_COMMUNITY): Payer: Self-pay | Admitting: *Deleted

## 2021-09-07 ENCOUNTER — Other Ambulatory Visit (HOSPITAL_COMMUNITY): Payer: Self-pay

## 2021-09-07 VITALS — BP 129/71 | HR 78 | Temp 97.6°F | Resp 18 | Wt 250.0 lb

## 2021-09-07 DIAGNOSIS — Z79899 Other long term (current) drug therapy: Secondary | ICD-10-CM | POA: Diagnosis not present

## 2021-09-07 DIAGNOSIS — C3492 Malignant neoplasm of unspecified part of left bronchus or lung: Secondary | ICD-10-CM | POA: Diagnosis not present

## 2021-09-07 DIAGNOSIS — C3412 Malignant neoplasm of upper lobe, left bronchus or lung: Secondary | ICD-10-CM | POA: Diagnosis not present

## 2021-09-07 DIAGNOSIS — C787 Secondary malignant neoplasm of liver and intrahepatic bile duct: Secondary | ICD-10-CM | POA: Insufficient documentation

## 2021-09-07 DIAGNOSIS — R0602 Shortness of breath: Secondary | ICD-10-CM | POA: Diagnosis not present

## 2021-09-07 DIAGNOSIS — Z7901 Long term (current) use of anticoagulants: Secondary | ICD-10-CM | POA: Diagnosis not present

## 2021-09-07 DIAGNOSIS — C7931 Secondary malignant neoplasm of brain: Secondary | ICD-10-CM | POA: Insufficient documentation

## 2021-09-07 DIAGNOSIS — C3490 Malignant neoplasm of unspecified part of unspecified bronchus or lung: Secondary | ICD-10-CM

## 2021-09-07 DIAGNOSIS — R350 Frequency of micturition: Secondary | ICD-10-CM | POA: Insufficient documentation

## 2021-09-07 DIAGNOSIS — I2699 Other pulmonary embolism without acute cor pulmonale: Secondary | ICD-10-CM | POA: Insufficient documentation

## 2021-09-07 MED ORDER — CYANOCOBALAMIN 1000 MCG/ML IJ SOLN
1000.0000 ug | Freq: Once | INTRAMUSCULAR | Status: AC
  Administered 2021-09-07: 1000 ug via INTRAMUSCULAR
  Filled 2021-09-07: qty 1

## 2021-09-07 MED ORDER — FOLIC ACID 1 MG PO TABS: 1.0000 mg | ORAL_TABLET | Freq: Every day | ORAL | 4 refills | Status: DC

## 2021-09-07 NOTE — Progress Notes (Signed)
DISCONTINUE ON PATHWAY REGIMEN - Non-Small Cell Lung     A cycle is every 14 days:     Durvalumab   **Always confirm dose/schedule in your pharmacy ordering system**  REASON: Other Reason PRIOR TREATMENT: [id number]: Durvalumab 10 mg/kg q14 Days x up to 12 Months TREATMENT RESPONSE: Progressive Disease (PD)    Patient Characteristics: Stage IV Metastatic, Squamous Therapeutic Status: Stage IV Metastatic Histology: Squamous Cell

## 2021-09-07 NOTE — Patient Instructions (Addendum)
[place] at [facility] Discharge Instructions   You were seen and examined today by Dr. Delton Coombes.  He reviewed the results of the special test we sent on your cancer cells. There were no mutations that would allow [place] to give you targeted treatment. The best option would be treatment with chemotherapy and immunotherapy.   He reviewed the results of your PET scan which shows that lung cancer has spread to your liver. This makes your disease a Stage IV disease. This is not curable. But we will continue treatment as long as it helps to keep this cancer under control.   We will give you a B12 shot today and periodically as part of your treatment to protect you from toxicity from the chemotherapy. We also sent a prescription for you for folic acid to your pharmacy. You will need to take this pill every day.   Return as scheduled.      Thank you for choosing [place] at [facility] to provide your oncology and hematology care.  To afford each patient quality time with our provider, please arrive at least 15 minutes before your scheduled appointment time.   If you have a lab appointment with the [facility] please come in thru the Main Entrance and check in at the main information desk.  You need to re-schedule your appointment should you arrive 10 or more minutes late.  We strive to give you quality time with our providers, and arriving late affects you and other patients whose appointments are after yours.  Also, if you no show three or more times for appointments you may be dismissed from the clinic at the providers discretion.     Again, thank you for choosing [facility].  Our hope is that these requests will decrease the amount of time that you wait before being seen by our physicians.       _____________________________________________________________  Should you have questions after your visit to [facility],  please contact our office at [phone] and follow the prompts.  Our office hours are 8:00 a.m. and 4:30 p.m. Monday - Friday.  Please note that voicemails left after 4:00 p.m. may not be returned until the following business day.  We are closed weekends and major holidays.  You do have access to a nurse 24-7, just call the main number to the clinic [phone] and do not press any options, hold on the line and a nurse will answer the phone.    For prescription refill requests, have your pharmacy contact our office and allow 72 hours.    Due to Covid, you will need to wear a mask upon entering the hospital. If you do not have a mask, a mask will be given to you at the Main Entrance upon arrival. For doctor visits, patients may have 1 support person age 32 or older with them. For treatment visits, patients can not have anyone with them due to social distancing guidelines and our immunocompromised population.

## 2021-09-07 NOTE — Patient Instructions (Signed)
[place]  Discharge Instructions: Thank you for choosing [place] to provide your oncology and hematology care.  If you have a lab appointment with the [facility], please come in thru the Main Entrance and check in at the main information desk.  Wear comfortable clothing and clothing appropriate for easy access to any Portacath or PICC line.   We strive to give you quality time with your provider. You may need to reschedule your appointment if you arrive late (15 or more minutes).  Arriving late affects you and other patients whose appointments are after yours.  Also, if you miss three or more appointments without notifying the office, you may be dismissed from the clinic at the provider's discretion.      For prescription refill requests, have your pharmacy contact our office and allow 72 hours for refills to be completed.    Today you received\ vitamin b12 injection.    To help prevent nausea and vomiting after your treatment, we encourage you to take your nausea medication as directed.  BELOW ARE SYMPTOMS THAT SHOULD BE REPORTED IMMEDIATELY: *FEVER GREATER THAN 100.4 F (38 C) OR HIGHER *CHILLS OR SWEATING *NAUSEA AND VOMITING THAT IS NOT CONTROLLED WITH YOUR NAUSEA MEDICATION *UNUSUAL SHORTNESS OF BREATH *UNUSUAL BRUISING OR BLEEDING *URINARY PROBLEMS (pain or burning when urinating, or frequent urination) *BOWEL PROBLEMS (unusual diarrhea, constipation, pain near the anus) TENDERNESS IN MOUTH AND THROAT WITH OR WITHOUT PRESENCE OF ULCERS (sore throat, sores in mouth, or a toothache) UNUSUAL RASH, SWELLING OR PAIN  UNUSUAL VAGINAL DISCHARGE OR ITCHING   Items with * indicate a potential emergency and should be followed up as soon as possible or go to the Emergency Department if any problems should occur.  Please show the CHEMOTHERAPY ALERT CARD or IMMUNOTHERAPY ALERT CARD at check-in to the Emergency Department and triage nurse.  Should you have  questions after your visit or need to cancel or reschedule your appointment, please contact [facility] [phone]  and follow the prompts.  Office hours are 8:00 a.m. to 4:30 p.m. Monday - Friday. Please note that voicemails left after 4:00 p.m. may not be returned until the following business day.  We are closed weekends and major holidays. You have access to a nurse at all times for urgent questions. Please call the main number to the clinic [phone] and follow the prompts.  For any non-urgent questions, you may also contact your provider using MyChart. We now offer e-Visits for anyone 98 and older to request care online for non-urgent symptoms. For details visit [email or web address].   Also download the MyChart app! Go to the app store, search "MyChart", open the app, select [place], and log in with your MyChart username and password.  Masks are optional in the cancer centers. If you would like for your care team to wear a mask while they are taking care of you, please let them know. For doctor visits, patients may have with them one support person who is at least 70 years old. At this time, visitors are not allowed in the infusion area.

## 2021-09-07 NOTE — Progress Notes (Signed)
Denise [name], Denise [name] [address]1974   CLINIC:  Medical Oncology/Hematology  PCP:  Denise [name], [address] / [place] [address] [phone]   REASON FOR VISIT:  Follow-up for stage IV adenocarcinoma of the lung, negative for targetable mutations  PRIOR THERAPY:  1. Chemoradiation with carboplatin and paclitaxel from 01/07/2019 to 02/11/2019. 2. Consolidation with durvalumab from 03/19/2019 to 04/16/2019, held due to pneumonitis.  NGS Results: not done  CURRENT THERAPY: Consolidation with durvalumab every 2 weeks; brain mets SRS in 5 fractions  BRIEF ONCOLOGIC HISTORY:  Oncology History  Adenocarcinoma of left lung ([place])  12/09/2018 Initial Diagnosis   Adenocarcinoma of left lung ([place])   01/02/2019 Cancer Staging   Staging form: Lung, AJCC 8th Edition - Clinical stage from 01/02/2019: Stage IIIB (cT3, cN2, cM0) - Signed by Denise Jack, [name] on 01/02/2019   01/07/2019 - 02/11/2019 Chemotherapy   The patient had palonosetron (ALOXI) injection 0.25 mg, 0.25 mg, Intravenous,  Once, 6 of 6 cycles Administration: 0.25 mg (01/07/2019), 0.25 mg (01/14/2019), 0.25 mg (01/21/2019), 0.25 mg (01/28/2019), 0.25 mg (02/04/2019), 0.25 mg (02/11/2019) CARBOplatin (PARAPLATIN) 270 mg in sodium chloride 0.9 % 250 mL chemo infusion, 270 mg (100 % of original dose 266.4 mg), Intravenous,  Once, 6 of 6 cycles Dose modification:   (original dose 266.4 mg, Cycle 1),   (original dose 266.4 mg, Cycle 2),   (original dose 266.4 mg, Cycle 3),   (original dose 266.4 mg, Cycle 4) Administration: 270 mg (01/07/2019), 270 mg (01/14/2019), 270 mg (01/21/2019), 270 mg (01/28/2019), 270 mg (02/04/2019), 270 mg (02/11/2019) PACLitaxel (TAXOL) 108 mg in sodium chloride 0.9 % 250 mL chemo infusion (</= 64m/m2), 45 mg/m2 = 108 mg, Intravenous,  Once, 6 of 6 cycles Administration: 108 mg (01/07/2019), 108 mg (01/14/2019), 108 mg (01/21/2019), 108 mg (01/28/2019), 108  mg (02/04/2019), 108 mg (02/11/2019) fosaprepitant (EMEND) 150 mg, dexamethasone (DECADRON) 12 mg in sodium chloride 0.9 % 145 mL IVPB, , Intravenous,  Once, 5 of 5 cycles Administration:  (01/14/2019),  (01/21/2019),  (01/28/2019),  (02/04/2019),  (02/11/2019)  for chemotherapy treatment.    03/19/2019 - 07/23/2020 Chemotherapy   Patient is on Treatment Plan : LUNG DURVALUMAB Q14D       CANCER STAGING:  Cancer Staging  Adenocarcinoma of left lung ([facility] Staging form: Lung, AJCC 8th Edition - Clinical stage from 01/02/2019: Stage IIIB (cT3, cN2, cM0) - Signed by Denise [name] on 01/02/2019   INTERVAL HISTORY:  Denise [name] a 71y.o. female, returns for routine follow-up of her stage III left lung adenocarcinoma. [name] last seen on 08/17/2021.   Today she reports feeling well. She reports SOB with exertion. She reports right knee pain. Her appetite is good, and she is eating her typical portions. She has lost 10 lbs since 5/31. She is currently taking a daily vitamin B-12 supplement. She reports swelling in her lower legs below her knees bilaterally for which she is taking 20 mg Lasix daily. She also reports urinary frequency.   REVIEW OF SYSTEMS:  Review of Systems  Constitutional:  Positive for unexpected weight change (-10 lbs). Negative for appetite change.  Respiratory:  Positive for shortness of breath.   Cardiovascular:  Positive for leg swelling.  Genitourinary:  Positive for frequency.   Musculoskeletal:  Positive for arthralgias (R knee).  All other systems reviewed and are negative.   PAST MEDICAL/SURGICAL HISTORY:  Past Medical History:  Diagnosis Date   Anemia  Aortic stenosis    Arthritis    Cancer (HCC)    Lung   Coronary artery calcification seen on CT scan    Essential hypertension    GERD (gastroesophageal reflux disease)    History of lung cancer    Stage III adenocarcinoma status post chemoradiation   Port-A-Cath in place 01/06/2019    Type 2 diabetes mellitus [facility])    Past Surgical History:  Procedure Laterality Date   CHOLECYSTECTOMY  1997   COLONOSCOPY     GASTRIC BYPASS     INCISIONAL HERNIA REPAIR  04/11/11   IR IMAGING GUIDED PORT INSERTION  12/27/2018   Right   IR US GUIDE BX ASP/DRAIN  08/08/2021   LAPAROSCOPIC SALPINGOOPHERECTOMY     LAPAROTOMY  04/11/2011   Procedure: EXPLORATORY LAPAROTOMY;  Surgeon: Joyice Faster. Cornett, [name];  Location: [facility];  Service: General;  Laterality: N/A;  closure port hole   RIGHT/LEFT HEART CATH AND CORONARY ANGIOGRAPHY N/A 12/29/2019   Procedure: RIGHT/LEFT HEART CATH AND CORONARY ANGIOGRAPHY;  Surgeon: Troy Sine, [name];  Location: [place] [facility];  Service: Cardiovascular;  Laterality: N/A;   TOTAL HIP ARTHROPLASTY  03/07/2012   Procedure: TOTAL HIP ARTHROPLASTY ANTERIOR APPROACH;  Surgeon: Mauri Pole, [name];  Location: [facility];  Service: Orthopedics;  Laterality: Right;   TOTAL SHOULDER ARTHROPLASTY Left 01/21/2015   TOTAL SHOULDER ARTHROPLASTY Left 01/21/2015   Procedure: LEFT TOTAL SHOULDER ARTHROPLASTY;  Surgeon: Justice Britain, [name];  Location: [facility];  Service: Orthopedics;  Laterality: Left;   VAGINAL HYSTERECTOMY      SOCIAL HISTORY:  Social History   Socioeconomic History   Marital status: Single    Spouse name: Not on file   Number of children: Not on file   Years of education: 12th grade   Highest education level: Not on file  Occupational History   Occupation: Employed    Employer: [employer]  Tobacco Use   Smoking status: Former    Packs/day: 1.00    Years: 12.00    Total pack years: 12.00    Types: Cigarettes    Quit date: 03/20/1976    Years since quitting: 45.4   Smokeless tobacco: Never  Vaping Use   Vaping Use: Never used  Substance and Sexual Activity   Alcohol use: No   Drug use: No   Sexual activity: Not Currently  Other Topics Concern   Not on file  Social History Narrative   Not on file   Social Determinants of Health    Financial Resource Strain: Low Risk  (12/06/2018)   Overall Financial Resource Strain (CARDIA)    Difficulty of Paying Living Expenses: Not very hard  Food Insecurity: No Food Insecurity (12/06/2018)   Hunger Vital Sign    Worried About Running Out of Food in the Last Year: Never true    Ran Out of Food in the Last Year: Never true  Transportation Needs: No Transportation Needs (12/06/2018)   PRAPARE - Hydrologist (Medical): No    Lack of Transportation (Non-Medical): No  Physical Activity: Inactive (12/06/2018)   Exercise Vital Sign    Days of Exercise per Week: 0 days    Minutes of Exercise per Session: 0 min  Stress: No Stress Concern Present (12/06/2018)   [place]    Feeling of Stress : Only a little  Social Connections: Moderately Isolated (12/06/2018)   Social Connection and Isolation Panel [NHANES]  Frequency of Communication with Friends and Family: More than three times a week    Frequency of Social Gatherings with Friends and Family: Once a week    Attends Religious Services: More than 4 times per year    Active Member of [employer] or Organizations: No    Attends Archivist Meetings: Never    Marital Status: Never married  Intimate Partner Violence: Not At Risk (12/06/2018)   Humiliation, Afraid, Rape, and Kick questionnaire    Fear of Current or Ex-Partner: No    Emotionally Abused: No    Physically Abused: No    Sexually Abused: No    FAMILY HISTORY:  Family History  Problem Relation Age of Onset   Breast cancer Mother    COPD Mother    Arthritis Mother    Diabetes Mother    Hypertension Mother    Hypertension Father    Diabetes Father    Breast cancer Sister    Thyroid cancer Brother    Huntington's disease Maternal Grandmother    Heart attack Brother     CURRENT MEDICATIONS:  Current Outpatient Medications  Medication Sig Dispense Refill   folic  acid (FOLVITE) 1 MG tablet Take 1 tablet (1 mg total) by mouth daily. 30 tablet 4   albuterol (VENTOLIN HFA) 108 (90 Base) MCG/ACT inhaler TAKE 2 PUFFS BY MOUTH EVERY 6 HOURS AS NEEDED FOR WHEEZE OR SHORTNESS OF BREATH (Patient taking differently: Inhale 2 puffs into the lungs every 6 (six) hours as needed for wheezing or shortness of breath.) 18 each 6   apixaban (ELIQUIS) 5 MG TABS tablet Take 1 tablet (5 mg total) by mouth 2 (two) times daily. 60 tablet 2   Calcium Carbonate Antacid (TUMS PO) Take 4 tablets by mouth daily.     carvedilol (COREG) 3.125 MG tablet Take 1 tablet (3.125 mg total) by mouth 2 (two) times daily. 180 tablet 0   Cyanocobalamin (VITAMIN B-12) 2500 MCG SUBL Place 2,500 mcg under the tongue every morning.      diphenhydrAMINE (BENADRYL) 25 MG tablet Take 25 mg by mouth every 6 (six) hours as needed for itching or allergies.     ferrous sulfate 325 (65 FE) MG tablet Take 325 mg by mouth every evening.     furosemide (LASIX) 20 MG tablet Take 20 mg by mouth daily.     gabapentin (NEURONTIN) 300 MG capsule Take 300 mg by mouth at bedtime as needed (Nerve Pain).     HYDROcodone-acetaminophen (NORCO/VICODIN) 5-325 MG tablet Take 1 tablet by mouth every 8 (eight) hours as needed. for pain     Multiple Vitamin (MULITIVITAMIN WITH MINERALS) TABS Take 1 tablet by mouth daily.     omeprazole (PRILOSEC) 20 MG capsule Take 20 mg by mouth daily.     Potassium Chloride ER 20 MEQ TBCR Take 1 tablet by mouth daily.     Current Facility-Administered Medications  Medication Dose Route Frequency Provider Last Rate Last Admin   cyanocobalamin ((VITAMIN B-12)) injection 1,000 mcg  1,000 mcg Intramuscular Once Denise Jack, [name]        ALLERGIES:  No Known Allergies  PHYSICAL EXAM:  Performance status (ECOG): 1 - Symptomatic but completely ambulatory  Vitals:   09/07/21 1053  BP: 129/71  Pulse: 78  Resp: 18  Temp: 97.6 F (36.4 C)  SpO2: 95%   Wt Readings from Last 3  Encounters:  09/07/21 250 lb (113.4 kg)  08/17/21 260 lb 1.6 oz (118 kg)  08/04/21 257 lb  4.4 oz (116.7 kg)   Physical Exam Vitals reviewed.  Constitutional:      Appearance: Normal appearance. She is obese.  Neurological:     Mental Status: She is alert.      LABORATORY DATA:  I have reviewed the labs as listed.     Latest Ref Rng & Units 08/09/2021    3:55 AM 08/08/2021    4:04 AM 08/07/2021    8:18 AM  CBC  WBC 4.0 - 10.5 K/uL 7.2  6.4  7.4   Hemoglobin 12.0 - 15.0 g/dL 10.5  10.9  11.7   Hematocrit 36.0 - 46.0 % 32.7  34.2  36.7   Platelets 150 - 400 K/uL 377  385  402       Latest Ref Rng & Units 08/09/2021    3:55 AM 08/08/2021    4:04 AM 08/05/2021    3:59 AM  CMP  Glucose 70 - 99 mg/dL 100  107  125   BUN 8 - 23 mg/dL '8  9  5   ' Creatinine 0.44 - 1.00 mg/dL 0.89  0.92  0.84   Sodium 135 - 145 mmol/L 142  141  141   Potassium 3.5 - 5.1 mmol/L 3.7  3.5  3.6   Chloride 98 - 111 mmol/L 110  110  106   CO2 22 - 32 mmol/L '24  24  26   ' Calcium 8.9 - 10.3 mg/dL 8.8  8.7  8.6     DIAGNOSTIC IMAGING:  I have independently reviewed the scans and discussed with the patient. NM PET Image Restag (PS) Skull Base To Thigh  Result Date: 08/27/2021 CLINICAL DATA:  Subsequent treatment strategy for non-small cell lung cancer. EXAM: NUCLEAR MEDICINE PET SKULL BASE TO THIGH TECHNIQUE: 12.45 mCi F-18 FDG was injected intravenously. Full-ring PET imaging was performed from the skull base to thigh after the radiotracer. CT data was obtained and used for attenuation correction and anatomic localization. Fasting blood glucose: 113 mg/dl COMPARISON:  MRI abdomen 08/05/2021, CT chest 07/17/2021 FINDINGS: Mediastinal blood pool activity: SUV max 3.1 Liver activity: SUV max NA NECK: No hypermetabolic lymph nodes in the neck. Incidental CT findings: none CHEST: No tracer avid mediastinal, hilar, axillary, or supraclavicular lymph nodes. The masslike architectural distortion, volume loss and fibrosis  within the paramediastinal left lung is again noted without signs of residual/recurrent FDG avid tumor. -Within the apical segment of the right upper lobe there is a lung nodule measuring 9 mm within SUV max of 5.0, image 59/3. This is new compared with 04/21/2021 and 07/17/2021. - There is a ground-glass and solid nodule within the periphery of the right upper lobe measures 8 mm with an SUV max of 2.45. On the previous exam there was a ill-defined area of ground-glass attenuation attributed to pulmonary infarct in this area measuring 2.5 cm. Incidental CT findings: Scattered areas of ground-glass attenuation within the right upper lobe and right middle lobe are identified. This is nonspecific and may be seen with inflammation or infection. This could also represent residua of recent pulmonary embolus. Cardiac enlargement with aortic atherosclerosis and coronary artery calcifications. ABDOMEN/PELVIS: Within the lateral right lobe of liver there is a mass which measures approximately 4.5 x 3.6 cm within SUV max of 7.78, image 129/3. This corresponds to the suspicious lesion characterized on recent MRI. No abnormal tracer uptake identified within the pancreas, spleen or adrenal glands. The no tracer avid abdominopelvic lymph nodes. Incidental CT findings: Aortic atherosclerosis. Cholecystectomy. Postop changes involving the  stomach and small bowel loops. Moderate size periumbilical hernia contains nonobstructed loops of small bowel. SKELETON: No signs tracer avid bone metastases. Incidental CT findings: There is mild increased radiotracer uptake localizing to previously noted intramuscular hematoma involving the left lower rectus muscle. The hematoma has decreased in size measuring 6.1 x 3.9 cm, image 205/3. Previously this measured 11.5 x 8.1 cm. Scattered, subcutaneous soft tissue nodules within the ventral abdominal wall exhibit mild increased tracer uptake with SUV max 2.89, image 191/3. These are favored to  represent injection sites. IMPRESSION: 1. Recently characterized by MRI suspicious mass in the right hepatic lobe is FDG avid and worrisome for metastatic disease. 2. There are 2 new nodules within the right upper lobe which exhibit mild to moderate increased tracer uptake. These are both new from 07/17/2021. The most suspicious nodule is in the apex which appears more solid and has an SUV max of 5.0. Metastatic disease cannot be excluded. Mixed ground-glass and solid nodule within the periphery of the right upper lobe exhibits mild uptake with SUV max of 2.45. This is in an area of previously characterized pulmonary infarct and may reflect inflammatory changes secondary to resolving infarct. Interval surveillance imaging of both nodules is advised to assess for any temporal change. 3. Stable post radiation changes within the perihilar left lung without signs of tracer avid locally recurrent tumor. 4. Interval decrease in size of previous left lower rectus muscle hematoma. Electronically Signed   By: Kerby Moors M.D.   On: 08/27/2021 11:57   IR US Guide Bx Asp/Drain  Result Date: 08/08/2021 INDICATION: Multiple right liver masses EXAM: Ultrasound-guided biopsy of right liver mass MEDICATIONS: None. ANESTHESIA/SEDATION: Moderate (conscious) sedation was employed during this procedure. A total of Versed 1.5 mg and Fentanyl 50 mcg was administered intravenously by the radiology nurse. Total intra-service moderate Sedation Time: 8 minutes. The patient's level of consciousness and vital signs were monitored continuously by radiology nursing throughout the procedure under my direct supervision. COMPLICATIONS: None immediate. PROCEDURE: Informed written consent was obtained from the patient after a thorough discussion of the procedural risks, benefits and alternatives. All questions were addressed. Maximal Sterile Barrier Technique was utilized including caps, mask, sterile gowns, sterile gloves, sterile drape, hand  hygiene and skin antiseptic. A timeout was performed prior to the initiation of the procedure. Patient position supine on the ultrasound table. Right upper quadrant skin prepped and draped in usual sterile fashion. Following local lidocaine administration, 17 gauge introducer needle was advanced into 1 of the right hepatic lesions, and 4- 18 gauge cores were obtained utilizing continuous ultrasound guidance. Gelfoam slurry was administered through the introducer needle at the biopsy site. Samples were sent to pathology in formalin. Needle removed and hemostasis achieved with 5 minutes of manual compression. Post procedure ultrasound images showed no evidence of significant hemorrhage. IMPRESSION: Ultrasound-guided biopsy of right liver mass. Electronically Signed   By: Miachel Roux M.D.   On: 08/08/2021 16:21     ASSESSMENT:  1.  Adenocarcinoma of left lung (HCC) -Chemoradiation therapy with carboplatin and paclitaxel from 01/07/2019 through 02/11/2019. -Consolidation immunotherapy with durvalumab from 03/19/2019 through 04/15/2018, held due to pneumonitis. -CT chest on 07/02/2019 shows left upper lobe lung mass measuring 2.6 x 2.0 cm.  It shows improvement in size. -MRI of the brain on 07/18/2019 showed left sphenoid wing meningioma measuring 2.6 x 2.0 x 2.6 cm unchanged.  No new enhancing intracranial lesion.  Increased left temporal white matter edema. -Durvalumab restarted on 07/24/2019. -She was evaluated  by cardiology with a stress test.  EF was 46%.  She underwent cardiac catheterization which did not show any abnormalities. - 1 year of durvalumab completed on 07/23/2020. - Liver mass biopsy (08/08/2021): Adenocarcinoma, CK7 positive, negative for TTF-1, Napsin a, GATA3, ER, CK20, CDX2 - PET scan (08/25/2021): Right upper lobe lung nodule 9 mm, SUV 5.0.  Groundglass and solid nodule periphery of the right upper lobe 8 mm, SUV 2.45.  Right lobe liver mass 4.5 x 3.6 cm, SUV 7.78. - NGS testing: PD-L1  negative, TMB-low, MSI-stable, K-ras G12 D.  No other targetable mutations. - Carboplatin, pemetrexed and pembrolizumab   PLAN:  1.  Metastatic adenocarcinoma of the lung to the liver and right lung: - I discussed the PET scan images with the patient. - We also discussed results of NGS testing which did not show any targetable mutations. - We talked about palliative chemoimmunotherapy with carboplatin, pemetrexed and pembrolizumab.  Treatment intent is palliative. - We discussed side effects of this regimen in detail. - We will likely start her in the first week of July.   2.  Meningioma: - Last MRI on 04/02/2021 with no progression of the left sphenoid wing meningioma since October 2022.  3.  Unprovoked pulmonary embolism: - Saturations are 95% on room air.  She does have shortness of breath on minimal exertion. - Continue Eliquis 5 mg twice daily indefinitely.  No bleeding issues reported.   Orders placed this encounter:  Orders Placed This Encounter  Procedures   CT CHEST [place], [name] [phone]   I, Thana Ates, am acting as a scribe for Dr. Derek [name].  I, Denise Jack [name], have reviewed the above documentation for accuracy and completeness, and I agree with the above.

## 2021-09-07 NOTE — Progress Notes (Signed)
START ON PATHWAY REGIMEN - Non-Small Cell Lung     A cycle is every 21 days:     Pembrolizumab      Pemetrexed      Carboplatin   **Always confirm dose/schedule in your pharmacy ordering system**  Patient Characteristics: Stage IV Metastatic, Nonsquamous, Molecular Analysis Completed, Molecular Alteration Present and Targeted Therapy Exhausted OR EGFR Exon 20+ or KRAS G12C+ or HER2+ Present and No Prior Chemo/Immunotherapy OR No Alteration Present, Initial  Chemotherapy/Immunotherapy, PS = 0, 1, No Alteration Present, No Alteration Present, Candidate for Immunotherapy, PD-L1 Expression Positive 1-49% (TPS) / Negative / Not Tested / Awaiting Test Results and Immunotherapy Candidate Therapeutic Status: Stage IV Metastatic Histology: Nonsquamous Cell Broad Molecular Profiling Status: Molecular Analysis Completed Molecular Analysis Results: No Alteration Present ECOG Performance Status: 1 Chemotherapy/Immunotherapy Line of Therapy: Initial Chemotherapy/Immunotherapy EGFR Exons 18-21 Mutation Testing Status: Completed and Negative ALK Fusion/Rearrangement Testing Status: Completed and Negative BRAF V600 Mutation Testing Status: Completed and Negative KRAS G12C Mutation Testing Status: Completed and Negative MET Exon 14 Mutation Testing Status: Completed and Negative RET Fusion/Rearrangement Testing Status: Completed and Negative HER2 Mutation Testing Status: Completed and Negative NTRK Fusion/Rearrangement Testing Status: Completed and Negative ROS1 Fusion/Rearrangement Testing Status: Completed and Negative Immunotherapy Candidate Status: Candidate for Immunotherapy PD-L1 Expression Status: PD-L1 Negative Intent of Therapy: Non-Curative / Palliative Intent, Discussed with Patient

## 2021-09-07 NOTE — Progress Notes (Signed)
ON PATHWAY REGIMEN - Non-Small Cell Lung  No Change  Continue With Treatment as Ordered.  Original Decision Date/Time: 03/05/2019 16:20     A cycle is every 14 days:     Durvalumab   **Always confirm dose/schedule in your pharmacy ordering system**  Patient Characteristics: Stage III - Unresectable, PS = 0, 1 AJCC T Category: T3 Current Disease Status: No Distant Mets or Local Recurrence AJCC N Category: N2 AJCC M Category: M0 AJCC 8 Stage Grouping: IIIB ECOG Performance Status: 0 Intent of Therapy: Curative Intent, Discussed with Patient

## 2021-09-07 NOTE — Progress Notes (Signed)
Patient tolerated injection with no complaints voiced.  Site clean and dry with no bruising or swelling noted at site.  See MAR for details.  Band aid applied.  Patient stable during and after injection.  Vss with discharge and left in satisfactory condition with no s/s of distress noted.  

## 2021-09-08 ENCOUNTER — Encounter (HOSPITAL_COMMUNITY): Payer: Self-pay

## 2021-09-16 NOTE — Progress Notes (Signed)
Pharmacist Chemotherapy Monitoring - Initial Assessment    Anticipated start date: 09/22/21   The following has been reviewed per standard work regarding the patient's treatment regimen: The patient's diagnosis, treatment plan and drug doses, and organ/hematologic function Lab orders and baseline tests specific to treatment regimen  The treatment plan start date, drug sequencing, and pre-medications Prior authorization status  Patient's documented medication list, including drug-drug interaction screen and prescriptions for anti-emetics and supportive care specific to the treatment regimen The drug concentrations, fluid compatibility, administration routes, and timing of the medications to be used The patient's access for treatment and lifetime cumulative dose history, if applicable  The patient's medication allergies and previous infusion related reactions, if applicable   Changes made to treatment plan:  N/A  Follow up needed:  N/A  Received B-12 IM - 09/07/21   Wynona Neat, RPH, 09/16/2021  11:26 AM

## 2021-09-19 ENCOUNTER — Ambulatory Visit: Payer: Medicare HMO | Admitting: Internal Medicine

## 2021-09-22 ENCOUNTER — Inpatient Hospital Stay (HOSPITAL_COMMUNITY): Payer: Medicare HMO

## 2021-09-22 ENCOUNTER — Encounter (HOSPITAL_COMMUNITY): Payer: Self-pay

## 2021-09-22 ENCOUNTER — Inpatient Hospital Stay (HOSPITAL_COMMUNITY): Payer: Medicare HMO | Attending: Hematology

## 2021-09-22 VITALS — BP 126/73 | HR 92 | Temp 97.8°F | Resp 20

## 2021-09-22 DIAGNOSIS — Z5112 Encounter for antineoplastic immunotherapy: Secondary | ICD-10-CM | POA: Insufficient documentation

## 2021-09-22 DIAGNOSIS — Z5111 Encounter for antineoplastic chemotherapy: Secondary | ICD-10-CM | POA: Diagnosis not present

## 2021-09-22 DIAGNOSIS — Z79899 Other long term (current) drug therapy: Secondary | ICD-10-CM | POA: Insufficient documentation

## 2021-09-22 DIAGNOSIS — I2699 Other pulmonary embolism without acute cor pulmonale: Secondary | ICD-10-CM | POA: Insufficient documentation

## 2021-09-22 DIAGNOSIS — C3492 Malignant neoplasm of unspecified part of left bronchus or lung: Secondary | ICD-10-CM

## 2021-09-22 DIAGNOSIS — C787 Secondary malignant neoplasm of liver and intrahepatic bile duct: Secondary | ICD-10-CM | POA: Diagnosis not present

## 2021-09-22 DIAGNOSIS — Z7901 Long term (current) use of anticoagulants: Secondary | ICD-10-CM | POA: Insufficient documentation

## 2021-09-22 DIAGNOSIS — C3412 Malignant neoplasm of upper lobe, left bronchus or lung: Secondary | ICD-10-CM | POA: Diagnosis not present

## 2021-09-22 LAB — COMPREHENSIVE METABOLIC PANEL
ALT: 13 U/L (ref 0–44)
AST: 15 U/L (ref 15–41)
Albumin: 3.3 g/dL — ABNORMAL LOW (ref 3.5–5.0)
Alkaline Phosphatase: 76 U/L (ref 38–126)
Anion gap: 5 (ref 5–15)
BUN: 11 mg/dL (ref 8–23)
CO2: 26 mmol/L (ref 22–32)
Calcium: 8.5 mg/dL — ABNORMAL LOW (ref 8.9–10.3)
Chloride: 111 mmol/L (ref 98–111)
Creatinine, Ser: 0.85 mg/dL (ref 0.44–1.00)
GFR, Estimated: >60 mL/min (ref >60)
Glucose, Bld: 107 mg/dL — ABNORMAL HIGH (ref 70–99)
Potassium: 3.5 mmol/L (ref 3.5–5.1)
Sodium: 142 mmol/L (ref 135–145)
Total Bilirubin: 0.3 mg/dL (ref 0.3–1.2)
Total Protein: 6.4 g/dL — ABNORMAL LOW (ref 6.5–8.1)

## 2021-09-22 LAB — CBC WITH DIFFERENTIAL/PLATELET
Abs Immature Granulocytes: 0.01 10*3/uL (ref 0.00–0.07)
Basophils Absolute: 0 10*3/uL (ref 0.0–0.1)
Basophils Relative: 1 %
Eosinophils Absolute: 0.1 10*3/uL (ref 0.0–0.5)
Eosinophils Relative: 3 %
HCT: 38 % (ref 36.0–46.0)
Hemoglobin: 11.7 g/dL — ABNORMAL LOW (ref 12.0–15.0)
Immature Granulocytes: 0 %
Lymphocytes Relative: 16 %
Lymphs Abs: 0.8 10*3/uL (ref 0.7–4.0)
MCH: 27.3 pg (ref 26.0–34.0)
MCHC: 30.8 g/dL (ref 30.0–36.0)
MCV: 88.6 fL (ref 80.0–100.0)
Monocytes Absolute: 0.4 10*3/uL (ref 0.1–1.0)
Monocytes Relative: 8 %
Neutro Abs: 3.4 10*3/uL (ref 1.7–7.7)
Neutrophils Relative %: 72 %
Platelets: 321 10*3/uL (ref 150–400)
RBC: 4.29 MIL/uL (ref 3.87–5.11)
RDW: 14.7 % (ref 11.5–15.5)
WBC: 4.8 10*3/uL (ref 4.0–10.5)
nRBC: 0 % (ref 0.0–0.2)

## 2021-09-22 LAB — TSH: TSH: 2.426 u[IU]/mL (ref 0.350–4.500)

## 2021-09-22 LAB — MAGNESIUM: Magnesium: 2.2 mg/dL (ref 1.7–2.4)

## 2021-09-22 MED ORDER — PALONOSETRON HCL INJECTION 0.25 MG/5ML
0.2500 mg | Freq: Once | INTRAVENOUS | Status: AC
  Administered 2021-09-22: 0.25 mg via INTRAVENOUS
  Filled 2021-09-22: qty 5

## 2021-09-22 MED ORDER — SODIUM CHLORIDE 0.9 % IV SOLN
150.0000 mg | Freq: Once | INTRAVENOUS | Status: AC
  Administered 2021-09-22: 150 mg via INTRAVENOUS
  Filled 2021-09-22: qty 150

## 2021-09-22 MED ORDER — SODIUM CHLORIDE 0.9 % IV SOLN
10.0000 mg | Freq: Once | INTRAVENOUS | Status: AC
  Administered 2021-09-22: 10 mg via INTRAVENOUS
  Filled 2021-09-22: qty 10

## 2021-09-22 MED ORDER — SODIUM CHLORIDE 0.9 % IV SOLN
590.0000 mg | Freq: Once | INTRAVENOUS | Status: AC
  Administered 2021-09-22: 590 mg via INTRAVENOUS
  Filled 2021-09-22: qty 59

## 2021-09-22 MED ORDER — SODIUM CHLORIDE 0.9 % IV SOLN
500.0000 mg/m2 | Freq: Once | INTRAVENOUS | Status: AC
  Administered 2021-09-22: 1100 mg via INTRAVENOUS
  Filled 2021-09-22: qty 40

## 2021-09-22 MED ORDER — SODIUM CHLORIDE 0.9 % IV SOLN
200.0000 mg | Freq: Once | INTRAVENOUS | Status: AC
  Administered 2021-09-22: 200 mg via INTRAVENOUS
  Filled 2021-09-22: qty 8

## 2021-09-22 MED ORDER — SODIUM CHLORIDE 0.9 % IV SOLN: Freq: Once | INTRAVENOUS | Status: AC

## 2021-09-22 MED ORDER — SODIUM CHLORIDE 0.9% FLUSH
10.0000 mL | INTRAVENOUS | Status: DC | PRN
  Administered 2021-09-22 (×2): 10 mL

## 2021-09-22 MED ORDER — HEPARIN SOD (PORK) LOCK FLUSH 100 UNIT/ML IV SOLN
500.0000 [IU] | Freq: Once | INTRAVENOUS | Status: AC | PRN
  Administered 2021-09-22: 500 [IU]

## 2021-09-22 NOTE — Patient Instructions (Signed)
[facility]  Discharge Instructions: Thank you for choosing [facility] to provide your oncology and hematology care.  If you have a lab appointment with the [facility], please come in thru the Main Entrance and check in at the main information desk.  Wear comfortable clothing and clothing appropriate for easy access to any Portacath or PICC line.   We strive to give you quality time with your provider. You may need to reschedule your appointment if you arrive late (15 or more minutes).  Arriving late affects you and other patients whose appointments are after yours.  Also, if you miss three or more appointments without notifying the office, you may be dismissed from the clinic at the provider's discretion.      For prescription refill requests, have your pharmacy contact our office and allow 72 hours for refills to be completed.    Today you received the following chemotherapy and/or immunotherapy agents Carboplatin, Alimta, and Keytruda, return as scheduled. Pemetrexed injection What is this medication? PEMETREXED (PEM e TREX ed) is a chemotherapy drug used to treat lung cancers like non-small cell lung cancer and mesothelioma. It may also be used to treat other cancers. This medicine may be used for other purposes; ask your health care provider or pharmacist if you have questions. COMMON BRAND NAME(S): Alimta, PEMFEXY What should I tell my care team before I take this medication? They need to know if you have any of these conditions: infection (especially a virus infection such as chickenpox, cold sores, or herpes) kidney disease low blood counts, like low white cell, platelet, or red cell counts lung or breathing disease, like asthma radiation therapy an unusual or allergic reaction to pemetrexed, other medicines, foods, dyes, or preservative pregnant or trying to get pregnant breast-feeding How should I use this medication? This drug is given as an  infusion into a vein. It is administered in a hospital or clinic by a specially trained health care professional. Talk to your pediatrician regarding the use of this medicine in children. Special care may be needed. Overdosage: If you think you have taken too much of this medicine contact a poison control center or emergency room at once. NOTE: This medicine is only for you. Do not share this medicine with others. What if I miss a dose? It is important not to miss your dose. Call your doctor or health care professional if you are unable to keep an appointment. What may interact with this medication? This medicine may interact with the following medications: Ibuprofen This list may not describe all possible interactions. Give your health care provider a list of all the medicines, herbs, non-prescription drugs, or dietary supplements you use. Also tell them if you smoke, drink alcohol, or use illegal drugs. Some items may interact with your medicine. What should I watch for while using this medication? Visit your doctor for checks on your progress. This drug may make you feel generally unwell. This is not uncommon, as chemotherapy can affect healthy cells as well as cancer cells. Report any side effects. Continue your course of treatment even though you feel ill unless your doctor tells you to stop. In some cases, you may be given additional medicines to help with side effects. Follow all directions for their use. Call your doctor or health care professional for advice if you get a fever, chills or sore throat, or other symptoms of a cold or flu. Do not treat yourself. This drug decreases your body's ability  to fight infections. Try to avoid being around people who are sick. This medicine may increase your risk to bruise or bleed. Call your doctor or health care professional if you notice any unusual bleeding. Be careful brushing and flossing your teeth or using a toothpick because you may get an  infection or bleed more easily. If you have any dental work done, tell your dentist you are receiving this medicine. Avoid taking products that contain aspirin, acetaminophen, ibuprofen, naproxen, or ketoprofen unless instructed by your doctor. These medicines may hide a fever. Call your doctor or health care professional if you get diarrhea or mouth sores. Do not treat yourself. To protect your kidneys, drink water or other fluids as directed while you are taking this medicine. Do not become pregnant while taking this medicine or for 6 months after stopping it. Women should inform their doctor if they wish to become pregnant or think they might be pregnant. Men should not father a child while taking this medicine and for 3 months after stopping it. This may interfere with the ability to father a child. You should talk to your doctor or health care professional if you are concerned about your fertility. There is a potential for serious side effects to an unborn child. Talk to your health care professional or pharmacist for more information. Do not breast-feed an infant while taking this medicine or for 1 week after stopping it. What side effects may I notice from receiving this medication? Side effects that you should report to your doctor or health care professional as soon as possible: allergic reactions like skin rash, itching or hives, swelling of the face, lips, or tongue breathing problems redness, blistering, peeling or loosening of the skin, including inside the mouth signs and symptoms of bleeding such as bloody or black, tarry stools; red or dark-brown urine; spitting up blood or brown material that looks like coffee grounds; red spots on the skin; unusual bruising or bleeding from the eye, gums, or nose signs and symptoms of infection like fever or chills; cough; sore throat; pain or trouble passing urine signs and symptoms of kidney injury like trouble passing urine or change in the amount of  urine signs and symptoms of liver injury like dark yellow or brown urine; general ill feeling or flu-like symptoms; light-colored stools; loss of appetite; nausea; right upper belly pain; unusually weak or tired; yellowing of the eyes or skin Side effects that usually do not require medical attention (report to your doctor or health care professional if they continue or are bothersome): constipation mouth sores nausea, vomiting unusually weak or tired This list may not describe all possible side effects. Call your doctor for medical advice about side effects. You may report side effects to FDA at 1-800-FDA-1088. Where should I keep my medication? This drug is given in a hospital or clinic and will not be stored at home. NOTE: This sheet is a summary. It may not cover all possible information. If you have questions about this medicine, talk to your doctor, pharmacist, or health care provider.  2023 Elsevier/Gold Standard (2017-05-01 00:00:00) Pembrolizumab injection What is this medication? PEMBROLIZUMAB (pem broe liz ue mab) is a monoclonal antibody. It is used to treat certain types of cancer. This medicine may be used for other purposes; ask your health care provider or pharmacist if you have questions. COMMON BRAND NAME(S): Keytruda What should I tell my care team before I take this medication? They need to know if you have any of  these conditions: autoimmune diseases like Crohn's disease, ulcerative colitis, or lupus have had or planning to have an allogeneic stem cell transplant (uses someone else's stem cells) history of organ transplant history of chest radiation nervous system problems like myasthenia gravis or Guillain-Barre syndrome an unusual or allergic reaction to pembrolizumab, other medicines, foods, dyes, or preservatives pregnant or trying to get pregnant breast-feeding How should I use this medication? This medicine is for infusion into a vein. It is given by a health  care professional in a hospital or clinic setting. A special MedGuide will be given to you before each treatment. Be sure to read this information carefully each time. Talk to your pediatrician regarding the use of this medicine in children. While this drug may be prescribed for children as young as 6 months for selected conditions, precautions do apply. Overdosage: If you think you have taken too much of this medicine contact a poison control center or emergency room at once. NOTE: This medicine is only for you. Do not share this medicine with others. What if I miss a dose? It is important not to miss your dose. Call your doctor or health care professional if you are unable to keep an appointment. What may interact with this medication? Interactions have not been studied. This list may not describe all possible interactions. Give your health care provider a list of all the medicines, herbs, non-prescription drugs, or dietary supplements you use. Also tell them if you smoke, drink alcohol, or use illegal drugs. Some items may interact with your medicine. What should I watch for while using this medication? Your condition will be monitored carefully while you are receiving this medicine. You may need blood work done while you are taking this medicine. Do not become pregnant while taking this medicine or for 4 months after stopping it. Women should inform their doctor if they wish to become pregnant or think they might be pregnant. There is a potential for serious side effects to an unborn child. Talk to your health care professional or pharmacist for more information. Do not breast-feed an infant while taking this medicine or for 4 months after the last dose. What side effects may I notice from receiving this medication? Side effects that you should report to your doctor or health care professional as soon as possible: allergic reactions like skin rash, itching or hives, swelling of the face, lips, or  tongue bloody or black, tarry breathing problems changes in vision chest pain chills confusion constipation cough diarrhea dizziness or feeling faint or lightheaded fast or irregular heartbeat fever flushing joint pain low blood counts - this medicine may decrease the number of white blood cells, red blood cells and platelets. You may be at increased risk for infections and bleeding. muscle pain muscle weakness pain, tingling, numbness in the hands or feet persistent headache redness, blistering, peeling or loosening of the skin, including inside the mouth signs and symptoms of high blood sugar such as dizziness; dry mouth; dry skin; fruity breath; nausea; stomach pain; increased hunger or thirst; increased urination signs and symptoms of kidney injury like trouble passing urine or change in the amount of urine signs and symptoms of liver injury like dark urine, light-colored stools, loss of appetite, nausea, right upper belly pain, yellowing of the eyes or skin sweating swollen lymph nodes weight loss Side effects that usually do not require medical attention (report to your doctor or health care professional if they continue or are bothersome): decreased appetite hair loss tiredness  This list may not describe all possible side effects. Call your doctor for medical advice about side effects. You may report side effects to FDA at 1-800-FDA-1088. Where should I keep my medication? This drug is given in a hospital or clinic and will not be stored at home. NOTE: This sheet is a summary. It may not cover all possible information. If you have questions about this medicine, talk to your doctor, pharmacist, or health care provider.  2023 Elsevier/Gold Standard (2021-02-04 00:00:00) Carboplatin injection What is this medication? CARBOPLATIN (KAR boe pla tin) is a chemotherapy drug. It targets fast dividing cells, like cancer cells, and causes these cells to die. This medicine is used  to treat ovarian cancer and many other cancers. This medicine may be used for other purposes; ask your health care provider or pharmacist if you have questions. COMMON BRAND NAME(S): Paraplatin What should I tell my care team before I take this medication? They need to know if you have any of these conditions: blood disorders hearing problems kidney disease recent or ongoing radiation therapy an unusual or allergic reaction to carboplatin, cisplatin, other chemotherapy, other medicines, foods, dyes, or preservatives pregnant or trying to get pregnant breast-feeding How should I use this medication? This drug is usually given as an infusion into a vein. It is administered in a hospital or clinic by a specially trained health care professional. Talk to your pediatrician regarding the use of this medicine in children. Special care may be needed. Overdosage: If you think you have taken too much of this medicine contact a poison control center or emergency room at once. NOTE: This medicine is only for you. Do not share this medicine with others. What if I miss a dose? It is important not to miss a dose. Call your doctor or health care professional if you are unable to keep an appointment. What may interact with this medication? medicines for seizures medicines to increase blood counts like filgrastim, pegfilgrastim, sargramostim some antibiotics like amikacin, gentamicin, neomycin, streptomycin, tobramycin vaccines Talk to your doctor or health care professional before taking any of these medicines: acetaminophen aspirin ibuprofen ketoprofen naproxen This list may not describe all possible interactions. Give your health care provider a list of all the medicines, herbs, non-prescription drugs, or dietary supplements you use. Also tell them if you smoke, drink alcohol, or use illegal drugs. Some items may interact with your medicine. What should I watch for while using this medication? Your  condition will be monitored carefully while you are receiving this medicine. You will need important blood work done while you are taking this medicine. This drug may make you feel generally unwell. This is not uncommon, as chemotherapy can affect healthy cells as well as cancer cells. Report any side effects. Continue your course of treatment even though you feel ill unless your doctor tells you to stop. In some cases, you may be given additional medicines to help with side effects. Follow all directions for their use. Call your doctor or health care professional for advice if you get a fever, chills or sore throat, or other symptoms of a cold or flu. Do not treat yourself. This drug decreases your body's ability to fight infections. Try to avoid being around people who are sick. This medicine may increase your risk to bruise or bleed. Call your doctor or health care professional if you notice any unusual bleeding. Be careful brushing and flossing your teeth or using a toothpick because you may get an infection or  bleed more easily. If you have any dental work done, tell your dentist you are receiving this medicine. Avoid taking products that contain aspirin, acetaminophen, ibuprofen, naproxen, or ketoprofen unless instructed by your doctor. These medicines may hide a fever. Do not become pregnant while taking this medicine. Women should inform their doctor if they wish to become pregnant or think they might be pregnant. There is a potential for serious side effects to an unborn child. Talk to your health care professional or pharmacist for more information. Do not breast-feed an infant while taking this medicine. What side effects may I notice from receiving this medication? Side effects that you should report to your doctor or health care professional as soon as possible: allergic reactions like skin rash, itching or hives, swelling of the face, lips, or tongue signs of infection - fever or chills,  cough, sore throat, pain or difficulty passing urine signs of decreased platelets or bleeding - bruising, pinpoint red spots on the skin, black, tarry stools, nosebleeds signs of decreased red blood cells - unusually weak or tired, fainting spells, lightheadedness breathing problems changes in hearing changes in vision chest pain high blood pressure low blood counts - This drug may decrease the number of white blood cells, red blood cells and platelets. You may be at increased risk for infections and bleeding. nausea and vomiting pain, swelling, redness or irritation at the injection site pain, tingling, numbness in the hands or feet problems with balance, talking, walking trouble passing urine or change in the amount of urine Side effects that usually do not require medical attention (report to your doctor or health care professional if they continue or are bothersome): hair loss loss of appetite metallic taste in the mouth or changes in taste This list may not describe all possible side effects. Call your doctor for medical advice about side effects. You may report side effects to FDA at 1-800-FDA-1088. Where should I keep my medication? This drug is given in a hospital or clinic and will not be stored at home. NOTE: This sheet is a summary. It may not cover all possible information. If you have questions about this medicine, talk to your doctor, pharmacist, or health care provider.  2023 Elsevier/Gold Standard (2007-08-14 00:00:00)    To help prevent nausea and vomiting after your treatment, we encourage you to take your nausea medication as directed.  BELOW ARE SYMPTOMS THAT SHOULD BE REPORTED IMMEDIATELY: *FEVER GREATER THAN 100.4 F (38 C) OR HIGHER *CHILLS OR SWEATING *NAUSEA AND VOMITING THAT IS NOT CONTROLLED WITH YOUR NAUSEA MEDICATION *UNUSUAL SHORTNESS OF BREATH *UNUSUAL BRUISING OR BLEEDING *URINARY PROBLEMS (pain or burning when urinating, or frequent  urination) *BOWEL PROBLEMS (unusual diarrhea, constipation, pain near the anus) TENDERNESS IN MOUTH AND THROAT WITH OR WITHOUT PRESENCE OF ULCERS (sore throat, sores in mouth, or a toothache) UNUSUAL RASH, SWELLING OR PAIN  UNUSUAL VAGINAL DISCHARGE OR ITCHING   Items with * indicate a potential emergency and should be followed up as soon as possible or go to the Emergency Department if any problems should occur.  Please show the CHEMOTHERAPY ALERT CARD or IMMUNOTHERAPY ALERT CARD at check-in to the Emergency Department and triage nurse.  Should you have questions after your visit or need to cancel or reschedule your appointment, please contact [facility] [phone]  and follow the prompts.  Office hours are 8:00 a.m. to 4:30 p.m. Monday - Friday. Please note that voicemails left after 4:00 p.m. may not be returned until the  following business day.  We are closed weekends and major holidays. You have access to a nurse at all times for urgent questions. Please call the main number to the clinic [phone] and follow the prompts.  For any non-urgent questions, you may also contact your provider using MyChart. We now offer e-Visits for anyone 82 and older to request care online for non-urgent symptoms. For details visit [email or web address].   Also download the MyChart app! Go to the app store, search "MyChart", open the app, select [facility], and log in with your MyChart username and password.  Masks are optional in the cancer centers. If you would like for your care team to wear a mask while they are taking care of you, please let them know. For doctor visits, patients may have with them one support person who is at least 70 years old. At this time, visitors are not allowed in the infusion area.

## 2021-09-22 NOTE — Progress Notes (Signed)
Patient tolerated chemotherapy with no complaints voiced. Side effects with management reviewed understanding verbalized. Port site clean and dry with no bruising or swelling noted at site. Good blood return noted before and after administration of chemotherapy. Band aid applied. Patient left in satisfactory condition with VSS and no s/s of distress noted. 

## 2021-09-23 ENCOUNTER — Telehealth (HOSPITAL_COMMUNITY): Payer: Self-pay

## 2021-09-23 DIAGNOSIS — R112 Nausea with vomiting, unspecified: Secondary | ICD-10-CM

## 2021-09-23 MED ORDER — PROCHLORPERAZINE MALEATE 10 MG PO TABS: 10.0000 mg | ORAL_TABLET | Freq: Four times a day (QID) | ORAL | 3 refills | Status: DC | PRN

## 2021-09-23 NOTE — Telephone Encounter (Signed)
Patient called for 24-hour follow up, patient states she is feeling good, and has only eaten watermelon this morning and is a little nervous to eat something else because she doesn't want to throw it up. Compazine sent into patient's pharmacy and patient made aware. Patient aware to call [facility] if needed.

## 2021-09-27 ENCOUNTER — Ambulatory Visit (HOSPITAL_COMMUNITY)
Admission: RE | Admit: 2021-09-27 | Discharge: 2021-09-27 | Disposition: A | Discharge status: Home / Self Care | Payer: Medicare HMO | Source: Ambulatory Visit | Attending: Neurosurgery | Admitting: Neurosurgery

## 2021-09-27 DIAGNOSIS — D329 Benign neoplasm of meninges, unspecified: Secondary | ICD-10-CM | POA: Insufficient documentation

## 2021-09-27 DIAGNOSIS — G9389 Other specified disorders of brain: Secondary | ICD-10-CM | POA: Diagnosis not present

## 2021-09-27 DIAGNOSIS — G936 Cerebral edema: Secondary | ICD-10-CM | POA: Diagnosis not present

## 2021-09-27 MED ORDER — GADOBUTROL 1 MMOL/ML IV SOLN
10.0000 mL | Freq: Once | INTRAVENOUS | Status: AC | PRN
  Administered 2021-09-27: 10 mL via INTRAVENOUS

## 2021-09-29 DIAGNOSIS — M1711 Unilateral primary osteoarthritis, right knee: Secondary | ICD-10-CM | POA: Insufficient documentation

## 2021-10-03 ENCOUNTER — Inpatient Hospital Stay: Payer: Medicare HMO | Attending: Neurosurgery

## 2021-10-03 DIAGNOSIS — Z6841 Body Mass Index (BMI) 40.0 and over, adult: Secondary | ICD-10-CM | POA: Diagnosis not present

## 2021-10-03 DIAGNOSIS — D329 Benign neoplasm of meninges, unspecified: Secondary | ICD-10-CM | POA: Diagnosis not present

## 2021-10-07 DIAGNOSIS — M170 Bilateral primary osteoarthritis of knee: Secondary | ICD-10-CM | POA: Diagnosis not present

## 2021-10-07 DIAGNOSIS — M25561 Pain in right knee: Secondary | ICD-10-CM | POA: Diagnosis not present

## 2021-10-10 ENCOUNTER — Other Ambulatory Visit: Payer: Self-pay

## 2021-10-13 ENCOUNTER — Inpatient Hospital Stay (HOSPITAL_COMMUNITY): Payer: Medicare HMO | Admitting: Hematology

## 2021-10-13 ENCOUNTER — Inpatient Hospital Stay (HOSPITAL_COMMUNITY): Payer: Medicare HMO

## 2021-10-13 VITALS — Wt 249.0 lb

## 2021-10-13 VITALS — BP 123/65 | HR 71 | Temp 98.0°F | Resp 18

## 2021-10-13 DIAGNOSIS — Z5112 Encounter for antineoplastic immunotherapy: Secondary | ICD-10-CM | POA: Diagnosis not present

## 2021-10-13 DIAGNOSIS — Z79899 Other long term (current) drug therapy: Secondary | ICD-10-CM | POA: Diagnosis not present

## 2021-10-13 DIAGNOSIS — C787 Secondary malignant neoplasm of liver and intrahepatic bile duct: Secondary | ICD-10-CM

## 2021-10-13 DIAGNOSIS — Z7901 Long term (current) use of anticoagulants: Secondary | ICD-10-CM | POA: Diagnosis not present

## 2021-10-13 DIAGNOSIS — C3492 Malignant neoplasm of unspecified part of left bronchus or lung: Secondary | ICD-10-CM

## 2021-10-13 DIAGNOSIS — Z5111 Encounter for antineoplastic chemotherapy: Secondary | ICD-10-CM | POA: Diagnosis not present

## 2021-10-13 DIAGNOSIS — C3412 Malignant neoplasm of upper lobe, left bronchus or lung: Secondary | ICD-10-CM | POA: Diagnosis not present

## 2021-10-13 DIAGNOSIS — I2699 Other pulmonary embolism without acute cor pulmonale: Secondary | ICD-10-CM | POA: Diagnosis not present

## 2021-10-13 LAB — CBC WITH DIFFERENTIAL/PLATELET
Abs Immature Granulocytes: 0.02 10*3/uL (ref 0.00–0.07)
Basophils Absolute: 0 10*3/uL (ref 0.0–0.1)
Basophils Relative: 1 %
Eosinophils Absolute: 0 10*3/uL (ref 0.0–0.5)
Eosinophils Relative: 1 %
HCT: 36 % (ref 36.0–46.0)
Hemoglobin: 11.4 g/dL — ABNORMAL LOW (ref 12.0–15.0)
Immature Granulocytes: 1 %
Lymphocytes Relative: 18 %
Lymphs Abs: 0.7 10*3/uL (ref 0.7–4.0)
MCH: 28 pg (ref 26.0–34.0)
MCHC: 31.7 g/dL (ref 30.0–36.0)
MCV: 88.5 fL (ref 80.0–100.0)
Monocytes Absolute: 0.4 10*3/uL (ref 0.1–1.0)
Monocytes Relative: 10 %
Neutro Abs: 2.7 10*3/uL (ref 1.7–7.7)
Neutrophils Relative %: 69 %
Platelets: 242 10*3/uL (ref 150–400)
RBC: 4.07 MIL/uL (ref 3.87–5.11)
RDW: 15.9 % — ABNORMAL HIGH (ref 11.5–15.5)
WBC: 3.9 10*3/uL — ABNORMAL LOW (ref 4.0–10.5)
nRBC: 0 % (ref 0.0–0.2)

## 2021-10-13 LAB — COMPREHENSIVE METABOLIC PANEL
ALT: 19 U/L (ref 0–44)
AST: 16 U/L (ref 15–41)
Albumin: 3.3 g/dL — ABNORMAL LOW (ref 3.5–5.0)
Alkaline Phosphatase: 83 U/L (ref 38–126)
Anion gap: 5 (ref 5–15)
BUN: 8 mg/dL (ref 8–23)
CO2: 26 mmol/L (ref 22–32)
Calcium: 8.6 mg/dL — ABNORMAL LOW (ref 8.9–10.3)
Chloride: 110 mmol/L (ref 98–111)
Creatinine, Ser: 0.85 mg/dL (ref 0.44–1.00)
GFR, Estimated: >60 mL/min (ref >60)
Glucose, Bld: 98 mg/dL (ref 70–99)
Potassium: 3.9 mmol/L (ref 3.5–5.1)
Sodium: 141 mmol/L (ref 135–145)
Total Bilirubin: 0.5 mg/dL (ref 0.3–1.2)
Total Protein: 6.1 g/dL — ABNORMAL LOW (ref 6.5–8.1)

## 2021-10-13 LAB — TSH: TSH: 2.721 u[IU]/mL (ref 0.350–4.500)

## 2021-10-13 LAB — MAGNESIUM: Magnesium: 2.1 mg/dL (ref 1.7–2.4)

## 2021-10-13 MED ORDER — HEPARIN SOD (PORK) LOCK FLUSH 100 UNIT/ML IV SOLN
500.0000 [IU] | Freq: Once | INTRAVENOUS | Status: AC | PRN
  Administered 2021-10-13: 500 [IU]

## 2021-10-13 MED ORDER — SODIUM CHLORIDE 0.9 % IV SOLN
500.0000 mg/m2 | Freq: Once | INTRAVENOUS | Status: AC
  Administered 2021-10-13: 1100 mg via INTRAVENOUS
  Filled 2021-10-13: qty 40

## 2021-10-13 MED ORDER — PALONOSETRON HCL INJECTION 0.25 MG/5ML
0.2500 mg | Freq: Once | INTRAVENOUS | Status: AC
  Administered 2021-10-13: 0.25 mg via INTRAVENOUS
  Filled 2021-10-13: qty 5

## 2021-10-13 MED ORDER — SODIUM CHLORIDE 0.9% FLUSH
10.0000 mL | INTRAVENOUS | Status: DC | PRN
  Administered 2021-10-13 (×2): 10 mL

## 2021-10-13 MED ORDER — APIXABAN 5 MG PO TABS: 5.0000 mg | ORAL_TABLET | Freq: Two times a day (BID) | ORAL | 6 refills | Status: DC

## 2021-10-13 MED ORDER — SODIUM CHLORIDE 0.9 % IV SOLN
200.0000 mg | Freq: Once | INTRAVENOUS | Status: AC
  Administered 2021-10-13: 200 mg via INTRAVENOUS
  Filled 2021-10-13: qty 8

## 2021-10-13 MED ORDER — SODIUM CHLORIDE 0.9 % IV SOLN: Freq: Once | INTRAVENOUS | Status: AC

## 2021-10-13 MED ORDER — SODIUM CHLORIDE 0.9 % IV SOLN
590.0000 mg | Freq: Once | INTRAVENOUS | Status: AC
  Administered 2021-10-13: 590 mg via INTRAVENOUS
  Filled 2021-10-13: qty 59

## 2021-10-13 MED ORDER — SODIUM CHLORIDE 0.9 % IV SOLN
10.0000 mg | Freq: Once | INTRAVENOUS | Status: AC
  Administered 2021-10-13: 10 mg via INTRAVENOUS
  Filled 2021-10-13: qty 10

## 2021-10-13 MED ORDER — SODIUM CHLORIDE 0.9 % IV SOLN
150.0000 mg | Freq: Once | INTRAVENOUS | Status: AC
  Administered 2021-10-13: 150 mg via INTRAVENOUS
  Filled 2021-10-13: qty 150

## 2021-10-13 NOTE — Patient Instructions (Signed)
[facility]  Discharge Instructions: Thank you for choosing [facility] to provide your oncology and hematology care.  If you have a lab appointment with the [facility], please come in thru the Main Entrance and check in at the main information desk.  Wear comfortable clothing and clothing appropriate for easy access to any Portacath or PICC line.   We strive to give you quality time with your provider. You may need to reschedule your appointment if you arrive late (15 or more minutes).  Arriving late affects you and other patients whose appointments are after yours.  Also, if you miss three or more appointments without notifying the office, you may be dismissed from the clinic at the provider's discretion.      For prescription refill requests, have your pharmacy contact our office and allow 72 hours for refills to be completed.    Today you received the following chemotherapy and/or immunotherapy agents Keytruda, Alimta, and Carboplatin, return as scheduled.   To help prevent nausea and vomiting after your treatment, we encourage you to take your nausea medication as directed.  BELOW ARE SYMPTOMS THAT SHOULD BE REPORTED IMMEDIATELY: *FEVER GREATER THAN 100.4 F (38 C) OR HIGHER *CHILLS OR SWEATING *NAUSEA AND VOMITING THAT IS NOT CONTROLLED WITH YOUR NAUSEA MEDICATION *UNUSUAL SHORTNESS OF BREATH *UNUSUAL BRUISING OR BLEEDING *URINARY PROBLEMS (pain or burning when urinating, or frequent urination) *BOWEL PROBLEMS (unusual diarrhea, constipation, pain near the anus) TENDERNESS IN MOUTH AND THROAT WITH OR WITHOUT PRESENCE OF ULCERS (sore throat, sores in mouth, or a toothache) UNUSUAL RASH, SWELLING OR PAIN  UNUSUAL VAGINAL DISCHARGE OR ITCHING   Items with * indicate a potential emergency and should be followed up as soon as possible or go to the Emergency Department if any problems should occur.  Please show the CHEMOTHERAPY ALERT CARD or IMMUNOTHERAPY  ALERT CARD at check-in to the Emergency Department and triage nurse.  Should you have questions after your visit or need to cancel or reschedule your appointment, please contact [facility] [phone]  and follow the prompts.  Office hours are 8:00 a.m. to 4:30 p.m. Monday - Friday. Please note that voicemails left after 4:00 p.m. may not be returned until the following business day.  We are closed weekends and major holidays. You have access to a nurse at all times for urgent questions. Please call the main number to the clinic [phone] and follow the prompts.  For any non-urgent questions, you may also contact your provider using MyChart. We now offer e-Visits for anyone 82 and older to request care online for non-urgent symptoms. For details visit [email or web address].   Also download the MyChart app! Go to the app store, search "MyChart", open the app, select , and log in with your MyChart username and password.  Masks are optional in the cancer centers. If you would like for your care team to wear a mask while they are taking care of you, please let them know. For doctor visits, patients may have with them one support person who is at least 70 years old. At this time, visitors are not allowed in the infusion area.

## 2021-10-13 NOTE — Progress Notes (Signed)
[address] [place], [place] [address]   CLINIC:  Medical Oncology/Hematology  PCP:  Iona Beard, [address] / [place] [address] [phone]   REASON FOR VISIT:  Follow-up for stage IV adenocarcinoma of the lung, negative for targetable mutations  PRIOR THERAPY:  1. Chemoradiation with carboplatin and paclitaxel from 01/07/2019 to 02/11/2019. 2. Consolidation with durvalumab from 03/19/2019 to 04/16/2019, held due to pneumonitis.  NGS Results: not done  CURRENT THERAPY: Consolidation with durvalumab every 2 weeks; brain mets SRS in 5 fractions  BRIEF ONCOLOGIC HISTORY:  Oncology History  Adenocarcinoma of left lung ([place])  12/09/2018 Initial Diagnosis   Adenocarcinoma of left lung ([place])   01/02/2019 Cancer Staging   Staging form: Lung, AJCC 8th Edition - Clinical stage from 01/02/2019: Stage IIIB (cT3, cN2, cM0) - Signed by Derek Jack, MD on 01/02/2019   01/07/2019 - 02/11/2019 Chemotherapy   The patient had palonosetron (ALOXI) injection 0.25 mg, 0.25 mg, Intravenous,  Once, 6 of 6 cycles Administration: 0.25 mg (01/07/2019), 0.25 mg (01/14/2019), 0.25 mg (01/21/2019), 0.25 mg (01/28/2019), 0.25 mg (02/04/2019), 0.25 mg (02/11/2019) CARBOplatin (PARAPLATIN) 270 mg in sodium chloride 0.9 % 250 mL chemo infusion, 270 mg (100 % of original dose 266.4 mg), Intravenous,  Once, 6 of 6 cycles Dose modification:   (original dose 266.4 mg, Cycle 1),   (original dose 266.4 mg, Cycle 2),   (original dose 266.4 mg, Cycle 3),   (original dose 266.4 mg, Cycle 4) Administration: 270 mg (01/07/2019), 270 mg (01/14/2019), 270 mg (01/21/2019), 270 mg (01/28/2019), 270 mg (02/04/2019), 270 mg (02/11/2019) PACLitaxel (TAXOL) 108 mg in sodium chloride 0.9 % 250 mL chemo infusion (</= 67m/m2), 45 mg/m2 = 108 mg, Intravenous,  Once, 6 of 6 cycles Administration: 108 mg (01/07/2019), 108 mg (01/14/2019), 108 mg (01/21/2019), 108 mg (01/28/2019), 108  mg (02/04/2019), 108 mg (02/11/2019) fosaprepitant (EMEND) 150 mg, dexamethasone (DECADRON) 12 mg in sodium chloride 0.9 % 145 mL IVPB, , Intravenous,  Once, 5 of 5 cycles Administration:  (01/14/2019),  (01/21/2019),  (01/28/2019),  (02/04/2019),  (02/11/2019)  for chemotherapy treatment.    03/19/2019 - 07/23/2020 Chemotherapy   Patient is on Treatment Plan : LUNG DURVALUMAB [id number]    09/22/2021 -  Chemotherapy   Patient is on Treatment Plan : LUNG Carboplatin (5) + Pemetrexed (500) + Pembrolizumab (200) D1 q21d Induction x 4 cycles / Maintenance Pemetrexed (500) + Pembrolizumab (200) D1 q21d       CANCER STAGING:  Cancer Staging  A[place]ocarcinoma of left lung ([place] Staging form: Lung, AJCC 8th Edition - Clinical stage from 01/02/2019: Stage IIIB (cT3, cN2, cM0) - Signed by KDerek Jack MD on 01/02/2019 - Pathologic stage from 09/07/2021: Stage IVA (pTX, pNX, pM1b) - Unsigned   INTERVAL HISTORY:  Ms. ASoliana Kitko a 71y.o. female, returns for routine follow-up and consideration for next cycle of chemotherapy. AKeyrywas last seen on 09/07/2021.  Due for cycle #2 of Carboplatin + Alimta + Keytruda today.   Overall, she tells me she has been feeling pretty well. She reports itching, burning, and "runny" eyes. She denies any history of allergies. Her appetite is good. Her weight stable. She reports an episode of watery diarrhea following each meal for which she take Imodium. Her SOB with exertion is stable, and she denies dry cough. She is taking Lasix every other day.   Overall, she feels ready for next cycle of chemo today.    REVIEW OF  SYSTEMS:  Review of Systems  Constitutional:  Negative for appetite change and fatigue.  Eyes:  Positive for eye problems.  Respiratory:  Positive for shortness of breath (with exertion). Negative for cough.   Gastrointestinal:  Positive for diarrhea and nausea.  Musculoskeletal:  Positive for arthralgias (7/10 R knee).  Skin:  Positive for  itching (eyes).  All other systems reviewed and are negative.   PAST MEDICAL/SURGICAL HISTORY:  Past Medical History:  Diagnosis Date   Anemia    Aortic stenosis    Arthritis    Cancer ([place])    Lung   Coronary artery calcification seen on CT scan    Essential hypertension    GERD (gastroesophageal reflux disease)    History of lung cancer    Stage III adenocarcinoma status post chemoradiation   Port-A-Cath in place 01/06/2019   Type 2 diabetes mellitus [facility])    Past Surgical History:  Procedure Laterality Date   CHOLECYSTECTOMY  1997   COLONOSCOPY     GASTRIC BYPASS     INCISIONAL HERNIA REPAIR  04/11/11   IR IMAGING GUIDED PORT INSERTION  12/27/2018   Right   IR US GUIDE BX ASP/DRAIN  08/08/2021   LAPAROSCOPIC SALPINGOOPHERECTOMY     LAPAROTOMY  04/11/2011   Procedure: EXPLORATORY LAPAROTOMY;  Surgeon: Joyice Faster. Cornett, MD;  Location: [facility];  Service: General;  Laterality: N/A;  closure port hole   RIGHT/LEFT HEART CATH AND CORONARY ANGIOGRAPHY N/A 12/29/2019   Procedure: RIGHT/LEFT HEART CATH AND CORONARY ANGIOGRAPHY;  Surgeon: Troy Sine, MD;  Location: [facility];  Service: Cardiovascular;  Laterality: N/A;   TOTAL HIP ARTHROPLASTY  03/07/2012   Procedure: TOTAL HIP ARTHROPLASTY ANTERIOR APPROACH;  Surgeon: Mauri Pole, MD;  Location: [facility];  Service: Orthopedics;  Laterality: Right;   TOTAL SHOULDER ARTHROPLASTY Left 01/21/2015   TOTAL SHOULDER ARTHROPLASTY Left 01/21/2015   Procedure: LEFT TOTAL SHOULDER ARTHROPLASTY;  Surgeon: Justice Britain, MD;  Location: [facility];  Service: Orthopedics;  Laterality: Left;   VAGINAL HYSTERECTOMY      SOCIAL HISTORY:  Social History   Socioeconomic History   Marital status: Single    Spouse name: Not on file   Number of children: Not on file   Years of education: 12th grade   Highest education level: Not on file  Occupational History   Occupation: Employed    Employer: [employer]  Tobacco Use    Smoking status: Former    Packs/day: 1.00    Years: 12.00    Total pack years: 12.00    Types: Cigarettes    Quit date: 03/20/1976    Years since quitting: 45.5   Smokeless tobacco: Never  Vaping Use   Vaping Use: Never used  Substance and Sexual Activity   Alcohol use: No   Drug use: No   Sexual activity: Not Currently  Other Topics Concern   Not on file  Social History Narrative   Not on file   Social Determinants of Health   Financial Resource Strain: Low Risk  (12/06/2018)   Overall Financial Resource Strain (CARDIA)    Difficulty of Paying Living Expenses: Not very hard  Food Insecurity: No Food Insecurity (12/06/2018)   Hunger Vital Sign    Worried About Running Out of Food in the Last Year: Never true    Ran Out of Food in the Last Year: Never true  Transportation Needs: No Transportation Needs (12/06/2018)   PRAPARE - Transportation    Lack of  Transportation (Medical): No    Lack of Transportation (Non-Medical): No  Physical Activity: Inactive (12/06/2018)   Exercise Vital Sign    Days of Exercise per Week: 0 days    Minutes of Exercise per Session: 0 min  Stress: No Stress Concern Present (12/06/2018)   [place]    Feeling of Stress : Only a little  Social Connections: Moderately Isolated (12/06/2018)   Social Connection and Isolation Panel [NHANES]    Frequency of Communication with Friends and Family: More than three times a week    Frequency of Social Gatherings with Friends and Family: Once a week    Attends Religious Services: More than 4 times per year    Active Member of [employer] or Organizations: No    Attends Archivist Meetings: Never    Marital Status: Never married  Intimate Partner Violence: Not At Risk (12/06/2018)   Humiliation, Afraid, Rape, and Kick questionnaire    Fear of Current or Ex-Partner: No    Emotionally Abused: No    Physically Abused: No    Sexually Abused: No     FAMILY HISTORY:  Family History  Problem Relation Age of Onset   Breast cancer Mother    COPD Mother    Arthritis Mother    Diabetes Mother    Hypertension Mother    Hypertension Father    Diabetes Father    Breast cancer Sister    Thyroid cancer Brother    Huntington's disease Maternal Grandmother    Heart attack Brother     CURRENT MEDICATIONS:  Current Outpatient Medications  Medication Sig Dispense Refill   albuterol (VENTOLIN HFA) 108 (90 Base) MCG/ACT inhaler TAKE 2 PUFFS BY MOUTH EVERY 6 HOURS AS NEEDED FOR WHEEZE OR SHORTNESS OF BREATH (Patient taking differently: Inhale 2 puffs into the lungs every 6 (six) hours as needed for wheezing or shortness of breath.) 18 each 6   Calcium Carbonate Antacid (TUMS PO) Take 4 tablets by mouth daily.     carvedilol (COREG) 3.125 MG tablet Take 1 tablet (3.125 mg total) by mouth 2 (two) times daily. 180 tablet 0   Cyanocobalamin (VITAMIN B-12) 2500 MCG SUBL Place 2,500 mcg under the tongue every morning.      diclofenac (VOLTAREN) 75 MG EC tablet Take 75 mg by mouth 2 (two) times daily.     diphenhydrAMINE (BENADRYL) 25 MG tablet Take 25 mg by mouth every 6 (six) hours as needed for itching or allergies.     ferrous sulfate 325 (65 FE) MG tablet Take 325 mg by mouth every evening.     folic acid (FOLVITE) 1 MG tablet Take 1 tablet (1 mg total) by mouth daily. 30 tablet 4   furosemide (LASIX) 20 MG tablet Take 20 mg by mouth daily.     gabapentin (NEURONTIN) 300 MG capsule Take 300 mg by mouth at bedtime as needed (Nerve Pain).     HYDROcodone-acetaminophen (NORCO/VICODIN) 5-325 MG tablet Take 1 tablet by mouth every 8 (eight) hours as needed. for pain     Multiple Vitamin (MULITIVITAMIN WITH MINERALS) TABS Take 1 tablet by mouth daily.     omeprazole (PRILOSEC) 20 MG capsule Take 20 mg by mouth daily.     Potassium Chloride ER 20 MEQ TBCR Take 1 tablet by mouth daily.     apixaban (ELIQUIS) 5 MG TABS tablet Take 1 tablet (5 mg total)  by mouth 2 (two) times daily. 60 tablet 6  prochlorperazine (COMPAZINE) 10 MG tablet Take 1 tablet (10 mg total) by mouth every 6 (six) hours as needed for nausea or vomiting. (Patient not taking: Reported on 10/13/2021) 30 tablet 3   No current facility-administered medications for this visit.    ALLERGIES:  No Known Allergies  PHYSICAL EXAM:  Performance status (ECOG): 1 - Symptomatic but completely ambulatory  There were no vitals filed for this visit. Wt Readings from Last 3 Encounters:  09/22/21 248 lb 10.9 oz (112.8 kg)  09/07/21 250 lb (113.4 kg)  08/17/21 260 lb 1.6 oz (118 kg)   Physical Exam Vitals reviewed.  Constitutional:      Appearance: Normal appearance.     Comments: In wheelchair  Cardiovascular:     Rate and Rhythm: Normal rate and regular rhythm.     Pulses: Normal pulses.     Heart sounds: Murmur heard.     Systolic murmur is present.  Pulmonary:     Effort: Pulmonary effort is normal.     Breath sounds: Normal breath sounds.  Musculoskeletal:     Right lower leg: No edema.     Left lower leg: No edema.  Neurological:     General: No focal deficit present.     Mental Status: She is alert and oriented to person, place, and time.  Psychiatric:        Mood and Affect: Mood normal.        Behavior: Behavior normal.     LABORATORY DATA:  I have reviewed the labs as listed.     Latest Ref Rng & Units 10/13/2021    9:06 AM 09/22/2021    7:57 AM 08/09/2021    3:55 AM  CBC  WBC 4.0 - 10.5 K/uL 3.9  4.8  7.2   Hemoglobin 12.0 - 15.0 g/dL 11.4  11.7  10.5   Hematocrit 36.0 - 46.0 % 36.0  38.0  32.7   Platelets 150 - 400 K/uL 242  321  377       Latest Ref Rng & Units 10/13/2021    9:06 AM 09/22/2021    7:57 AM 08/09/2021    3:55 AM  CMP  Glucose 70 - 99 mg/dL 98  107  100   BUN 8 - 23 mg/dL '8  11  8   ' Creatinine 0.44 - 1.00 mg/dL 0.85  0.85  0.89   Sodium 135 - 145 mmol/L 141  142  142   Potassium 3.5 - 5.1 mmol/L 3.9  3.5  3.7   Chloride 98 - 111  mmol/L 110  111  110   CO2 22 - 32 mmol/L '26  26  24   ' Calcium 8.9 - 10.3 mg/dL 8.6  8.5  8.8   Total Protein 6.5 - 8.1 g/dL 6.1  6.4    Total Bilirubin 0.3 - 1.2 mg/dL 0.5  0.3    Alkaline Phos 38 - 126 U/L 83  76    AST 15 - 41 U/L 16  15    ALT 0 - 44 U/L 19  13      DIAGNOSTIC IMAGING:  I have independently reviewed the scans and discussed with the patient. MR BRAIN W WO CONTRAST  Result Date: 09/29/2021 CLINICAL DATA:  Meningioma ([place]) D32.9 (ICD-10-CM). EXAM: MRI HEAD WITHOUT AND WITH CONTRAST TECHNIQUE: Multiplanar, multiecho pulse sequences of the brain and surrounding structures were obtained without and with intravenous contrast. CONTRAST:  64m GADAVIST GADOBUTROL 1 MMOL/ML IV SOLN COMPARISON:  MRI of the brain April 02, 2021. FINDINGS: Brain: Again seen dural-based extra-axial enhancing lesion centered at the greater left sphenoid wing extending superiorly along the left anterior clinoid process and measuring approximately 2.6 x 2.6 x 1.9 cm compared to 2.9 x 2.6 x 2.2 cm on prior MRI. Mass effect on the brain parenchyma with associated vasogenic edema which is decreased when compared to prior MRI and now restricted to the left anterior temporal lobe. Persistent area of indistinct anterior cortical margin with focus of parenchymal contrast enhancement, also reduced when compared to prior MRI. No acute infarct, hemorrhage, hydrocephalus or extra-axial collection. Vascular: Normal flow voids. Superior displacement of left MCA branches by the above described mass lesion. Skull and upper cervical spine: Normal marrow signal. Bilateral atlantooccipital joint effusion. Sinuses/Orbits: Negative. Other: None. IMPRESSION: Left sphenoid wing dural-based extra-axial enhancing lesion, most consistent with a meningioma, mildly decreased in size when compared to prior MRI, now measuring up to 2.6 cm (2.9 cm on prior). Interval decrease of the associated parenchymal vasogenic edema and focus of  parenchymal contrast enhancement. Electronically Signed   By: Pedro Earls M.D.   On: 09/29/2021 10:19     ASSESSMENT:  1.  Adenocarcinoma of left lung ([facility]) -Chemoradiation therapy with carboplatin and paclitaxel from 01/07/2019 through 02/11/2019. -Consolidation immunotherapy with durvalumab from 03/19/2019 through 04/15/2018, held due to pneumonitis. -CT chest on 07/02/2019 shows left upper lobe lung mass measuring 2.6 x 2.0 cm.  It shows improvement in size. -MRI of the brain on 07/18/2019 showed left sphenoid wing meningioma measuring 2.6 x 2.0 x 2.6 cm unchanged.  No new enhancing intracranial lesion.  Increased left temporal white matter edema. -Durvalumab restarted on 07/24/2019. -She was evaluated by cardiology with a stress test.  EF was 46%.  She underwent cardiac catheterization which did not show any abnormalities. - 1 year of durvalumab completed on 07/23/2020. - Liver mass biopsy (08/08/2021): Adenocarcinoma, CK7 positive, negative for TTF-1, Napsin a, GATA3, ER, CK20, CDX2 - PET scan (08/25/2021): Right upper lobe lung nodule 9 mm, SUV 5.0.  Groundglass and solid nodule periphery of the right upper lobe 8 mm, SUV 2.45.  Right lobe liver mass 4.5 x 3.6 cm, SUV 7.78. - NGS testing: PD-L1 negative, TMB-low, MSI-stable, K-ras G12 D.  No other targetable mutations. - Carboplatin, pemetrexed and pembrolizumab   PLAN:  1.  Metastatic adenocarcinoma of the lung to the liver and right lung: - She has tolerated first cycle reasonably well.  She reported running watery eyes. - She also reported watery stools after each meal.  She is taking Lasix every other day. - Reviewed labs today which showed normal LFTs and CBC.  TSH was 2.7. - Proceed with cycle 2 today.  RTC 3 weeks for follow-up. - Recommend lubricant eyedrops.  Recommend Imodium prior to meals.   2.  Meningioma: - Reviewed MRI of the brain (09/27/2021): Stable meningioma with no new lesions.  3.  Unprovoked pulmonary  embolism: - Continue Eliquis 5 mg twice daily indefinitely.  No bleeding issues.   Orders placed this encounter:  No orders of the defined types were placed in this encounter.    Derek Jack, MD [place] [phone]   I, Thana Ates, am acting as a scribe for Dr. Derek Jack.  I, Derek Jack MD, have reviewed the above documentation for accuracy and completeness, and I agree with the above.

## 2021-10-13 NOTE — Progress Notes (Signed)
Patient tolerated chemotherapy with no complaints voiced. Side effects with management reviewed understanding verbalized. Port site clean and dry with no bruising or swelling noted at site. Good blood return noted before and after administration of chemotherapy. Band aid applied. Patient left in satisfactory condition with VSS and no s/s of distress noted. 

## 2021-10-13 NOTE — Patient Instructions (Signed)
[place] at [facility] Discharge Instructions   You were seen and examined today by Dr. Delton Coombes.  He reviewed the results of your lab work which are normal/stable.  We will proceed with your treatment today.   Start taking Imodium 30 minutes prior to eating meals to help prevent diarrhea.   Return as scheduled in 3 weeks.    Thank you for choosing [place] at [facility] to provide your oncology and hematology care.  To afford each patient quality time with our provider, please arrive at least 15 minutes before your scheduled appointment time.   If you have a lab appointment with the [facility] please come in thru the Main Entrance and check in at the main information desk.  You need to re-schedule your appointment should you arrive 10 or more minutes late.  We strive to give you quality time with our providers, and arriving late affects you and other patients whose appointments are after yours.  Also, if you no show three or more times for appointments you may be dismissed from the clinic at the providers discretion.     Again, thank you for choosing [facility].  Our hope is that these requests will decrease the amount of time that you wait before being seen by our physicians.       _____________________________________________________________  Should you have questions after your visit to [facility], please contact our office at [phone] and follow the prompts.  Our office hours are 8:00 a.m. and 4:30 p.m. Monday - Friday.  Please note that voicemails left after 4:00 p.m. may not be returned until the following business day.  We are closed weekends and major holidays.  You do have access to a nurse 24-7, just call the main number to the clinic [phone] and do not press any options, hold on the line and a nurse will answer the phone.    For prescription refill requests, have your pharmacy contact  our office and allow 72 hours.    Due to Covid, you will need to wear a mask upon entering the hospital. If you do not have a mask, a mask will be given to you at the Main Entrance upon arrival. For doctor visits, patients may have 1 support person age 69 or older with them. For treatment visits, patients can not have anyone with them due to social distancing guidelines and our immunocompromised population.

## 2021-10-14 ENCOUNTER — Other Ambulatory Visit: Payer: Self-pay

## 2021-10-14 ENCOUNTER — Telehealth: Payer: Self-pay | Admitting: Cardiology

## 2021-10-14 MED ORDER — CARVEDILOL 3.125 MG PO TABS: 3.1250 mg | ORAL_TABLET | Freq: Two times a day (BID) | ORAL | 1 refills | Status: DC

## 2021-10-14 NOTE — Telephone Encounter (Signed)
Done

## 2021-10-14 NOTE — Telephone Encounter (Signed)
*  STAT* If patient is at the pharmacy, call can be transferred to refill team.   1. Which medications need to be refilled? (please list name of each medication and dose if known)   carvedilol (COREG) 3.125 MG tablet  2. Which pharmacy/location (including street and city if local pharmacy) is medication to be sent to?  [id number] - [place], [place]  3. Do they need a 30 day or 90 day supply?   90 day   Patient called stating she is out of this medication.   Patient has appointment scheduled on 10/27/21.

## 2021-10-15 ENCOUNTER — Other Ambulatory Visit: Payer: Self-pay

## 2021-10-16 DIAGNOSIS — M1712 Unilateral primary osteoarthritis, left knee: Secondary | ICD-10-CM | POA: Insufficient documentation

## 2021-10-26 ENCOUNTER — Other Ambulatory Visit (HOSPITAL_COMMUNITY): Payer: Medicare HMO

## 2021-10-26 ENCOUNTER — Ambulatory Visit (HOSPITAL_COMMUNITY): Payer: Medicare HMO

## 2021-10-26 NOTE — Progress Notes (Unsigned)
Cardiology Office Note  Date: 10/26/2021   ID: Denise [name], DOB 1950/07/24, MRN [id number]  PCP:  Iona Beard, MD  Cardiologist:  Rozann Lesches, MD Electrophysiologist:  None   No chief complaint on file.   History of Present Illness: Denise [name] is a 71 y.o. female last seen in May 2022.  Follow-up echocardiogram in May revealed LVEF 65 to 70% with mild diastolic dysfunction, moderate to severe RV dysfunction with severe pulmonary hypertension, moderate aortic stenosis with mean gradient 28 mmHg.  She had a recent visit with Dr. Delton Coombes in July, I reviewed the note.  Being managed for stage IV adenocarcinoma of the lung.  Past Medical History:  Diagnosis Date   Anemia    Aortic stenosis    Arthritis    Cancer ([place])    Lung   Coronary artery calcification seen on CT scan    Essential hypertension    GERD (gastroesophageal reflux disease)    History of lung cancer    Stage III adenocarcinoma status post chemoradiation   Port-A-Cath in place 01/06/2019   Type 2 diabetes mellitus [facility])     Past Surgical History:  Procedure Laterality Date   CHOLECYSTECTOMY  1997   COLONOSCOPY     GASTRIC BYPASS     INCISIONAL HERNIA REPAIR  04/11/11   IR IMAGING GUIDED PORT INSERTION  12/27/2018   Right   IR US GUIDE BX ASP/DRAIN  08/08/2021   LAPAROSCOPIC SALPINGOOPHERECTOMY     LAPAROTOMY  04/11/2011   Procedure: EXPLORATORY LAPAROTOMY;  Surgeon: Joyice Faster. Cornett, MD;  Location: [facility];  Service: General;  Laterality: N/A;  closure port hole   RIGHT/LEFT HEART CATH AND CORONARY ANGIOGRAPHY N/A 12/29/2019   Procedure: RIGHT/LEFT HEART CATH AND CORONARY ANGIOGRAPHY;  Surgeon: Troy Sine, MD;  Location: [place] [facility];  Service: Cardiovascular;  Laterality: N/A;   TOTAL HIP ARTHROPLASTY  03/07/2012   Procedure: TOTAL HIP ARTHROPLASTY ANTERIOR APPROACH;  Surgeon: Mauri Pole, MD;  Location: [facility];  Service: Orthopedics;  Laterality: Right;   TOTAL SHOULDER ARTHROPLASTY  Left 01/21/2015   TOTAL SHOULDER ARTHROPLASTY Left 01/21/2015   Procedure: LEFT TOTAL SHOULDER ARTHROPLASTY;  Surgeon: Justice Britain, MD;  Location: [place];  Service: Orthopedics;  Laterality: Left;   VAGINAL HYSTERECTOMY      Current Outpatient Medications  Medication Sig Dispense Refill   albuterol (VENTOLIN HFA) 108 (90 Base) MCG/ACT inhaler TAKE 2 PUFFS BY MOUTH EVERY 6 HOURS AS NEEDED FOR WHEEZE OR SHORTNESS OF BREATH (Patient taking differently: Inhale 2 puffs into the lungs every 6 (six) hours as needed for wheezing or shortness of breath.) 18 each 6   apixaban (ELIQUIS) 5 MG TABS tablet Take 1 tablet (5 mg total) by mouth 2 (two) times daily. 60 tablet 6   Calcium Carbonate Antacid (TUMS PO) Take 4 tablets by mouth daily.     carvedilol (COREG) 3.125 MG tablet Take 1 tablet (3.125 mg total) by mouth 2 (two) times daily. 180 tablet 1   Cyanocobalamin (VITAMIN B-12) 2500 MCG SUBL Place 2,500 mcg under the tongue every morning.      diclofenac (VOLTAREN) 75 MG EC tablet Take 75 mg by mouth 2 (two) times daily.     diphenhydrAMINE (BENADRYL) 25 MG tablet Take 25 mg by mouth every 6 (six) hours as needed for itching or allergies.     ferrous sulfate 325 (65 FE) MG tablet Take 325 mg by mouth every evening.     folic acid (FOLVITE) 1  MG tablet Take 1 tablet (1 mg total) by mouth daily. 30 tablet 4   furosemide (LASIX) 20 MG tablet Take 20 mg by mouth daily.     gabapentin (NEURONTIN) 300 MG capsule Take 300 mg by mouth at bedtime as needed (Nerve Pain).     HYDROcodone-acetaminophen (NORCO/VICODIN) 5-325 MG tablet Take 1 tablet by mouth every 8 (eight) hours as needed. for pain     Multiple Vitamin (MULITIVITAMIN WITH MINERALS) TABS Take 1 tablet by mouth daily.     omeprazole (PRILOSEC) 20 MG capsule Take 20 mg by mouth daily.     Potassium Chloride ER 20 MEQ TBCR Take 1 tablet by mouth daily.     prochlorperazine (COMPAZINE) 10 MG tablet Take 1 tablet (10 mg total) by mouth every 6 (six)  hours as needed for nausea or vomiting. (Patient not taking: Reported on 10/13/2021) 30 tablet 3   No current facility-administered medications for this visit.   Allergies:  Patient has no known allergies.   Social History: The patient  reports that she quit smoking about 45 years ago. Her smoking use included cigarettes. She has a 12.00 pack-year smoking history. She has never used smokeless tobacco. She reports that she does not drink alcohol and does not use drugs.   Family History: The patient's family history includes Arthritis in her mother; Breast cancer in her mother and sister; COPD in her mother; Diabetes in her father and mother; Heart attack in her brother; Huntington's disease in her maternal grandmother; Hypertension in her father and mother; Thyroid cancer in her brother.   ROS:  Please see the history of present illness. Otherwise, complete review of systems is positive for {NONE DEFAULTED:18576}.  All other systems are reviewed and negative.   Physical Exam: VS:  There were no vitals taken for this visit., BMI There is no height or weight on file to calculate BMI.  Wt Readings from Last 3 Encounters:  10/13/21 249 lb (112.9 kg)  09/22/21 248 lb 10.9 oz (112.8 kg)  09/07/21 250 lb (113.4 kg)    General: Patient appears comfortable at rest. HEENT: Conjunctiva and lids normal, oropharynx clear with moist mucosa. Neck: Supple, no elevated JVP or carotid bruits, no thyromegaly. Lungs: Clear to auscultation, nonlabored breathing at rest. Cardiac: Regular rate and rhythm, no S3 or significant systolic murmur, no pericardial rub. Abdomen: Soft, nontender, no hepatomegaly, bowel sounds present, no guarding or rebound. Extremities: No pitting edema, distal pulses 2+. Skin: Warm and dry. Musculoskeletal: No kyphosis. Neuropsychiatric: Alert and oriented x3, affect grossly appropriate.  ECG:  An ECG dated 07/17/2021 was personally reviewed today and demonstrated:  Sinus rhythm with  right bundle branch block and left anterior fascicular block.  Recent Labwork: 07/17/2021: B Natriuretic Peptide 554.0 10/13/2021: ALT 19; AST 16; BUN 8; Creatinine, Ser 0.85; Hemoglobin 11.4; Magnesium 2.1; Platelets 242; Potassium 3.9; Sodium 141; TSH 2.721     Component Value Date/Time   CHOL 168 10/08/2020 1143   TRIG 82 10/08/2020 1143   HDL 67 10/08/2020 1143   CHOLHDL 2.5 10/08/2020 1143   VLDL 16 10/08/2020 1143   LDLCALC 85 10/08/2020 1143    Other Studies Reviewed Today:  Echocardiogram 07/18/2021:  1. Left ventricular ejection fraction, by estimation, is 65 to 70%. The  left ventricle has normal function. Left ventricular endocardial border  not optimally defined to evaluate regional wall motion. There is severe  left ventricular hypertrophy. Left  ventricular diastolic parameters are consistent with Grade I diastolic  dysfunction (impaired  relaxation).   2. Ventricular septum is flattened in systole consistent with RV pressure  overload. . Right ventricular systolic function moderately to severely  reduced. The right ventricular size is severely enlarged. There is  severely elevated pulmonary artery  systolic pressure.   3. Right atrial size was severely dilated.   4. The mitral valve was not well visualized. No evidence of mitral valve  regurgitation. No evidence of mitral stenosis.   5. The tricuspid valve is abnormal. Tricuspid valve regurgitation is  moderate.   6. The aortic valve has an indeterminant number of cusps. There is  moderate calcification of the aortic valve. There is moderate thickening  of the aortic valve. Aortic valve regurgitation is mild. Moderate aortic  valve stenosis. Aortic valve mean  gradient measures 28.2 mmHg. Aortic valve peak gradient measures 46.9  mmHg. Aortic valve area, by VTI measures 1.25 cm.   7. The inferior vena cava is normal in size with greater than 50%  respiratory variability, suggesting right atrial pressure of 3 mmHg.    Assessment and Plan:    Medication Adjustments/Labs and Tests Ordered: Current medicines are reviewed at length with the patient today.  Concerns regarding medicines are outlined above.   Tests Ordered: No orders of the defined types were placed in this encounter.   Medication Changes: No orders of the defined types were placed in this encounter.   Disposition:  Follow up {follow up:15908}  Signed, Satira Sark, MD, [facility] 10/26/2021 2:19 PM    [facility] at [place], [place], [place] [address] Phone: [phone]; Fax: [phone]

## 2021-10-27 ENCOUNTER — Encounter: Payer: Self-pay | Admitting: Cardiology

## 2021-10-27 ENCOUNTER — Ambulatory Visit (INDEPENDENT_AMBULATORY_CARE_PROVIDER_SITE_OTHER): Payer: Medicare HMO | Admitting: Cardiology

## 2021-10-27 VITALS — BP 112/72 | HR 83 | Ht 62.0 in | Wt 244.8 lb

## 2021-10-27 DIAGNOSIS — I10 Essential (primary) hypertension: Secondary | ICD-10-CM | POA: Diagnosis not present

## 2021-10-27 DIAGNOSIS — I272 Pulmonary hypertension, unspecified: Secondary | ICD-10-CM | POA: Diagnosis not present

## 2021-10-27 DIAGNOSIS — I350 Nonrheumatic aortic (valve) stenosis: Secondary | ICD-10-CM | POA: Diagnosis not present

## 2021-10-27 NOTE — Patient Instructions (Addendum)

## 2021-10-28 ENCOUNTER — Other Ambulatory Visit: Payer: Self-pay

## 2021-11-02 ENCOUNTER — Ambulatory Visit (HOSPITAL_COMMUNITY): Payer: Medicare HMO | Admitting: Hematology

## 2021-11-03 ENCOUNTER — Inpatient Hospital Stay: Payer: Medicare HMO | Attending: Neurosurgery

## 2021-11-03 ENCOUNTER — Inpatient Hospital Stay (HOSPITAL_BASED_OUTPATIENT_CLINIC_OR_DEPARTMENT_OTHER): Payer: Medicare HMO | Admitting: Hematology

## 2021-11-03 ENCOUNTER — Inpatient Hospital Stay: Payer: Medicare HMO

## 2021-11-03 VITALS — BP 144/86 | HR 86 | Temp 96.9°F | Resp 18

## 2021-11-03 DIAGNOSIS — C7931 Secondary malignant neoplasm of brain: Secondary | ICD-10-CM | POA: Diagnosis not present

## 2021-11-03 DIAGNOSIS — Z5111 Encounter for antineoplastic chemotherapy: Secondary | ICD-10-CM | POA: Diagnosis not present

## 2021-11-03 DIAGNOSIS — D329 Benign neoplasm of meninges, unspecified: Secondary | ICD-10-CM | POA: Diagnosis not present

## 2021-11-03 DIAGNOSIS — C3492 Malignant neoplasm of unspecified part of left bronchus or lung: Secondary | ICD-10-CM | POA: Diagnosis not present

## 2021-11-03 DIAGNOSIS — C787 Secondary malignant neoplasm of liver and intrahepatic bile duct: Secondary | ICD-10-CM | POA: Diagnosis not present

## 2021-11-03 DIAGNOSIS — Z5189 Encounter for other specified aftercare: Secondary | ICD-10-CM | POA: Insufficient documentation

## 2021-11-03 DIAGNOSIS — I2699 Other pulmonary embolism without acute cor pulmonale: Secondary | ICD-10-CM | POA: Insufficient documentation

## 2021-11-03 DIAGNOSIS — Z7901 Long term (current) use of anticoagulants: Secondary | ICD-10-CM | POA: Insufficient documentation

## 2021-11-03 DIAGNOSIS — Z5112 Encounter for antineoplastic immunotherapy: Secondary | ICD-10-CM | POA: Insufficient documentation

## 2021-11-03 LAB — MAGNESIUM: Magnesium: 1.8 mg/dL (ref 1.7–2.4)

## 2021-11-03 LAB — COMPREHENSIVE METABOLIC PANEL
ALT: 20 U/L (ref 0–44)
AST: 19 U/L (ref 15–41)
Albumin: 3.2 g/dL — ABNORMAL LOW (ref 3.5–5.0)
Alkaline Phosphatase: 74 U/L (ref 38–126)
Anion gap: 6 (ref 5–15)
BUN: 9 mg/dL (ref 8–23)
CO2: 26 mmol/L (ref 22–32)
Calcium: 8.7 mg/dL — ABNORMAL LOW (ref 8.9–10.3)
Chloride: 110 mmol/L (ref 98–111)
Creatinine, Ser: 0.75 mg/dL (ref 0.44–1.00)
GFR, Estimated: >60 mL/min (ref >60)
Glucose, Bld: 103 mg/dL — ABNORMAL HIGH (ref 70–99)
Potassium: 3.4 mmol/L — ABNORMAL LOW (ref 3.5–5.1)
Sodium: 142 mmol/L (ref 135–145)
Total Bilirubin: 0.3 mg/dL (ref 0.3–1.2)
Total Protein: 6.3 g/dL — ABNORMAL LOW (ref 6.5–8.1)

## 2021-11-03 LAB — CBC WITH DIFFERENTIAL/PLATELET
Abs Immature Granulocytes: 0.01 10*3/uL (ref 0.00–0.07)
Basophils Absolute: 0 10*3/uL (ref 0.0–0.1)
Basophils Relative: 0 %
Eosinophils Absolute: 0 10*3/uL (ref 0.0–0.5)
Eosinophils Relative: 1 %
HCT: 33.6 % — ABNORMAL LOW (ref 36.0–46.0)
Hemoglobin: 10.8 g/dL — ABNORMAL LOW (ref 12.0–15.0)
Immature Granulocytes: 0 %
Lymphocytes Relative: 27 %
Lymphs Abs: 0.7 10*3/uL (ref 0.7–4.0)
MCH: 28.1 pg (ref 26.0–34.0)
MCHC: 32.1 g/dL (ref 30.0–36.0)
MCV: 87.3 fL (ref 80.0–100.0)
Monocytes Absolute: 0.3 10*3/uL (ref 0.1–1.0)
Monocytes Relative: 12 %
Neutro Abs: 1.5 10*3/uL — ABNORMAL LOW (ref 1.7–7.7)
Neutrophils Relative %: 60 %
Platelets: 225 10*3/uL (ref 150–400)
RBC: 3.85 MIL/uL — ABNORMAL LOW (ref 3.87–5.11)
RDW: 16.7 % — ABNORMAL HIGH (ref 11.5–15.5)
WBC: 2.5 10*3/uL — ABNORMAL LOW (ref 4.0–10.5)
nRBC: 0 % (ref 0.0–0.2)

## 2021-11-03 MED ORDER — SODIUM CHLORIDE 0.9 % IV SOLN
590.0000 mg | Freq: Once | INTRAVENOUS | Status: AC
  Administered 2021-11-03: 590 mg via INTRAVENOUS
  Filled 2021-11-03: qty 59

## 2021-11-03 MED ORDER — PALONOSETRON HCL INJECTION 0.25 MG/5ML
0.2500 mg | Freq: Once | INTRAVENOUS | Status: AC
  Administered 2021-11-03: 0.25 mg via INTRAVENOUS

## 2021-11-03 MED ORDER — HEPARIN SOD (PORK) LOCK FLUSH 100 UNIT/ML IV SOLN
500.0000 [IU] | Freq: Once | INTRAVENOUS | Status: AC | PRN
  Administered 2021-11-03: 500 [IU]

## 2021-11-03 MED ORDER — SODIUM CHLORIDE 0.9 % IV SOLN: Freq: Once | INTRAVENOUS | Status: AC

## 2021-11-03 MED ORDER — CYANOCOBALAMIN 1000 MCG/ML IJ SOLN
1000.0000 ug | Freq: Once | INTRAMUSCULAR | Status: AC
  Administered 2021-11-03: 1000 ug via INTRAMUSCULAR
  Filled 2021-11-03: qty 1

## 2021-11-03 MED ORDER — PALONOSETRON HCL INJECTION 0.25 MG/5ML
INTRAVENOUS | Status: AC
  Filled 2021-11-03: qty 5

## 2021-11-03 MED ORDER — SODIUM CHLORIDE 0.9% FLUSH
10.0000 mL | INTRAVENOUS | Status: DC | PRN
  Administered 2021-11-03: 10 mL

## 2021-11-03 MED ORDER — SODIUM CHLORIDE 0.9 % IV SOLN
10.0000 mg | Freq: Once | INTRAVENOUS | Status: AC
  Administered 2021-11-03: 10 mg via INTRAVENOUS
  Filled 2021-11-03: qty 1

## 2021-11-03 MED ORDER — SODIUM CHLORIDE 0.9 % IV SOLN
500.0000 mg/m2 | Freq: Once | INTRAVENOUS | Status: AC
  Administered 2021-11-03: 1100 mg via INTRAVENOUS
  Filled 2021-11-03: qty 40

## 2021-11-03 MED ORDER — SODIUM CHLORIDE 0.9 % IV SOLN
150.0000 mg | Freq: Once | INTRAVENOUS | Status: AC
  Administered 2021-11-03: 150 mg via INTRAVENOUS
  Filled 2021-11-03: qty 150

## 2021-11-03 MED ORDER — SODIUM CHLORIDE 0.9 % IV SOLN
200.0000 mg | Freq: Once | INTRAVENOUS | Status: AC
  Administered 2021-11-03: 200 mg via INTRAVENOUS
  Filled 2021-11-03: qty 8

## 2021-11-03 NOTE — Patient Instructions (Signed)
[place] at [facility] Discharge Instructions   You were seen and examined today by Dr. Delton Coombes.  He reviewed part of your lab work which is stable. Your white blood cell count is normal and hemoglobin is stable.   We will proceed with your treatment today.  We will repeat a CT scan prior to your next visit.   Return as scheduled.    Thank you for choosing [place] at [facility] to provide your oncology and hematology care.  To afford each patient quality time with our provider, please arrive at least 15 minutes before your scheduled appointment time.   If you have a lab appointment with the [facility] please come in thru the Main Entrance and check in at the main information desk.  You need to re-schedule your appointment should you arrive 10 or more minutes late.  We strive to give you quality time with our providers, and arriving late affects you and other patients whose appointments are after yours.  Also, if you no show three or more times for appointments you may be dismissed from the clinic at the providers discretion.     Again, thank you for choosing [facility].  Our hope is that these requests will decrease the amount of time that you wait before being seen by our physicians.       _____________________________________________________________  Should you have questions after your visit to [facility], please contact our office at [phone] and follow the prompts.  Our office hours are 8:00 a.m. and 4:30 p.m. Monday - Friday.  Please note that voicemails left after 4:00 p.m. may not be returned until the following business day.  We are closed weekends and major holidays.  You do have access to a nurse 24-7, just call the main number to the clinic [phone] and do not press any options, hold on the line and a nurse will answer the phone.    For prescription refill requests, have your pharmacy  contact our office and allow 72 hours.    Due to Covid, you will need to wear a mask upon entering the hospital. If you do not have a mask, a mask will be given to you at the Main Entrance upon arrival. For doctor visits, patients may have 1 support person age 46 or older with them. For treatment visits, patients can not have anyone with them due to social distancing guidelines and our immunocompromised population.

## 2021-11-03 NOTE — Patient Instructions (Signed)
[place]  Discharge Instructions: Thank you for choosing [facility] to provide your oncology and hematology care.  If you have a lab appointment with the [facility], please come in thru the Main Entrance and check in at the main information desk.  Wear comfortable clothing and clothing appropriate for easy access to any Portacath or PICC line.   We strive to give you quality time with your provider. You may need to reschedule your appointment if you arrive late (15 or more minutes).  Arriving late affects you and other patients whose appointments are after yours.  Also, if you miss three or more appointments without notifying the office, you may be dismissed from the clinic at the provider's discretion.      For prescription refill requests, have your pharmacy contact our office and allow 72 hours for refills to be completed.    Today you received the following chemotherapy and/or immunotherapy agents Keytruda, Alimat, Carboplatin.  Carboplatin Injection What is this medication? CARBOPLATIN (KAR boe pla tin) treats some types of cancer. It works by slowing down the growth of cancer cells. This medicine may be used for other purposes; ask your health care provider or pharmacist if you have questions. COMMON BRAND NAME(S): Paraplatin What should I tell my care team before I take this medication? They need to know if you have any of these conditions: Blood disorders Hearing problems Kidney disease Recent or ongoing radiation therapy An unusual or allergic reaction to carboplatin, cisplatin, other medications, foods, dyes, or preservatives Pregnant or trying to get pregnant Breast-feeding How should I use this medication? This medication is injected into a vein. It is given by your care team in a hospital or clinic setting. Talk to your care team about the use of this medication in children. Special care may be needed. Overdosage: If you think you  have taken too much of this medicine contact a poison control center or emergency room at once. NOTE: This medicine is only for you. Do not share this medicine with others. What if I miss a dose? Keep appointments for follow-up doses. It is important not to miss your dose. Call your care team if you are unable to keep an appointment. What may interact with this medication? Medications for seizures Some antibiotics, such as amikacin, gentamicin, neomycin, streptomycin, tobramycin Vaccines This list may not describe all possible interactions. Give your health care provider a list of all the medicines, herbs, non-prescription drugs, or dietary supplements you use. Also tell them if you smoke, drink alcohol, or use illegal drugs. Some items may interact with your medicine. What should I watch for while using this medication? Your condition will be monitored carefully while you are receiving this medication. You may need blood work while taking this medication. This medication may make you feel generally unwell. This is not uncommon, as chemotherapy can affect healthy cells as well as cancer cells. Report any side effects. Continue your course of treatment even though you feel ill unless your care team tells you to stop. In some cases, you may be given additional medications to help with side effects. Follow all directions for their use. This medication may increase your risk of getting an infection. Call your care team for advice if you get a fever, chills, sore throat, or other symptoms of a cold or flu. Do not treat yourself. Try to avoid being around people who are sick. Avoid taking medications that contain aspirin, acetaminophen, ibuprofen, naproxen, or  ketoprofen unless instructed by your care team. These medications may hide a fever. Be careful brushing or flossing your teeth or using a toothpick because you may get an infection or bleed more easily. If you have any dental work done, tell your  dentist you are receiving this medication. Talk to your care team if you wish to become pregnant or think you might be pregnant. This medication can cause serious birth defects. Talk to your care team about effective forms of contraception. Do not breast-feed while taking this medication. What side effects may I notice from receiving this medication? Side effects that you should report to your care team as soon as possible: Allergic reactions--skin rash, itching, hives, swelling of the face, lips, tongue, or throat Infection--fever, chills, cough, sore throat, wounds that don't heal, pain or trouble when passing urine, general feeling of discomfort or being unwell Low red blood cell level--unusual weakness or fatigue, dizziness, headache, trouble breathing Pain, tingling, or numbness in the hands or feet, muscle weakness, change in vision, confusion or trouble speaking, loss of balance or coordination, trouble walking, seizures Unusual bruising or bleeding Side effects that usually do not require medical attention (report to your care team if they continue or are bothersome): Hair loss Nausea Unusual weakness or fatigue Vomiting This list may not describe all possible side effects. Call your doctor for medical advice about side effects. You may report side effects to FDA at 1-800-FDA-1088. Where should I keep my medication? This medication is given in a hospital or clinic. It will not be stored at home. NOTE: This sheet is a summary. It may not cover all possible information. If you have questions about this medicine, talk to your doctor, pharmacist, or health care provider.  2023 Elsevier/Gold Standard (2021-06-28 00:00:00) Pemetrexed Injection What is this medication? PEMETREXED (PEM e TREX ed) treats some types of cancer. It works by slowing down the growth of cancer cells. This medicine may be used for other purposes; ask your health care provider or pharmacist if you have  questions. COMMON BRAND NAME(S): Alimta, PEMFEXY What should I tell my care team before I take this medication? They need to know if you have any of these conditions: Infection, such as chickenpox, cold sores, or herpes Kidney disease Low blood cell levels (white cells, red cells, and platelets) Lung or breathing disease, such as asthma Radiation therapy An unusual or allergic reaction to pemetrexed, other medications, foods, dyes, or preservatives If you or your partner are pregnant or trying to get pregnant Breast-feeding How should I use this medication? This medication is injected into a vein. It is given by your care team in a hospital or clinic setting. Talk to your care team about the use of this medication in children. Special care may be needed. Overdosage: If you think you have taken too much of this medicine contact a poison control center or emergency room at once. NOTE: This medicine is only for you. Do not share this medicine with others. What if I miss a dose? Keep appointments for follow-up doses. It is important not to miss your dose. Call your care team if you are unable to keep an appointment. What may interact with this medication? Do not take this medication with any of the following: Live virus vaccines This medication may also interact with the following: Ibuprofen This list may not describe all possible interactions. Give your health care provider a list of all the medicines, herbs, non-prescription drugs, or dietary supplements you use. Also  tell them if you smoke, drink alcohol, or use illegal drugs. Some items may interact with your medicine. What should I watch for while using this medication? Your condition will be monitored carefully while you are receiving this medication. This medication may make you feel generally unwell. This is not uncommon as chemotherapy can affect healthy cells as well as cancer cells. Report any side effects. Continue your course of  treatment even though you feel ill unless your care team tells you to stop. This medication can cause serious side effects. To reduce the risk, your care team may give you other medications to take before receiving this one. Be sure to follow the directions from your care team. This medication can cause a rash or redness in areas of the body that have previously had radiation therapy. If you have had radiation therapy, tell your care team if you notice a rash in this area. This medication may increase your risk of getting an infection. Call your care team for advice if you get a fever, chills, sore throat, or other symptoms of a cold or flu. Do not treat yourself. Try to avoid being around people who are sick. Be careful brushing or flossing your teeth or using a toothpick because you may get an infection or bleed more easily. If you have any dental work done, tell your dentist you are receiving this medication. Avoid taking medications that contain aspirin, acetaminophen, ibuprofen, naproxen, or ketoprofen unless instructed by your care team. These medications may hide a fever. Check with your care team if you have severe diarrhea, nausea, and vomiting, or if you sweat a lot. The loss of too much body fluid may make it dangerous for you to take this medication. Talk to your care team if you or your partner wish to become pregnant or think either of you might be pregnant. This medication can cause serious birth defects if taken during pregnancy and for 6 months after the last dose. A negative pregnancy test is required before starting this medication. A reliable form of contraception is recommended while taking this medication and for 6 months after the last dose. Talk to your care team about reliable forms of contraception. Do not father a child while taking this medication and for 3 months after the last dose. Use a condom while having sex during this time period. Do not breastfeed while taking this  medication and for 1 week after the last dose. This medication may cause infertility. Talk to your care team if you are concerned about your fertility. What side effects may I notice from receiving this medication? Side effects that you should report to your care team as soon as possible: Allergic reactions--skin rash, itching, hives, swelling of the face, lips, tongue, or throat Dry cough, shortness of breath or trouble breathing Infection--fever, chills, cough, sore throat, wounds that don't heal, pain or trouble when passing urine, general feeling of discomfort or being unwell Kidney injury--decrease in the amount of urine, swelling of the ankles, hands, or feet Low red blood cell level--unusual weakness or fatigue, dizziness, headache, trouble breathing Redness, blistering, peeling, or loosening of the skin, including inside the mouth Unusual bruising or bleeding Side effects that usually do not require medical attention (report to your care team if they continue or are bothersome): Fatigue Loss of appetite Nausea Vomiting This list may not describe all possible side effects. Call your doctor for medical advice about side effects. You may report side effects to FDA at 1-800-FDA-1088. Where  should I keep my medication? This medication is given in a hospital or clinic. It will not be stored at home. NOTE: This sheet is a summary. It may not cover all possible information. If you have questions about this medicine, talk to your doctor, pharmacist, or health care provider.  2023 Elsevier/Gold Standard (2021-07-11 00:00:00) Pembrolizumab Injection What is this medication? PEMBROLIZUMAB (PEM broe LIZ ue mab) treats some types of cancer. It works by helping your immune system slow or stop the spread of cancer cells. It is a monoclonal antibody. This medicine may be used for other purposes; ask your health care provider or pharmacist if you have questions. COMMON BRAND NAME(S): Keytruda What  should I tell my care team before I take this medication? They need to know if you have any of these conditions: Allogeneic stem cell transplant (uses someone else's stem cells) Autoimmune diseases, such as Crohn disease, ulcerative colitis, lupus History of chest radiation Nervous system problems, such as Guillain-Barre syndrome, myasthenia gravis Organ transplant An unusual or allergic reaction to pembrolizumab, other medications, foods, dyes, or preservatives Pregnant or trying to get pregnant Breast-feeding How should I use this medication? This medication is injected into a vein. It is given by your care team in a hospital or clinic setting. A special MedGuide will be given to you before each treatment. Be sure to read this information carefully each time. Talk to your care team about the use of this medication in children. While it may be prescribed for children as young as 6 months for selected conditions, precautions do apply. Overdosage: If you think you have taken too much of this medicine contact a poison control center or emergency room at once. NOTE: This medicine is only for you. Do not share this medicine with others. What if I miss a dose? Keep appointments for follow-up doses. It is important not to miss your dose. Call your care team if you are unable to keep an appointment. What may interact with this medication? Interactions have not been studied. This list may not describe all possible interactions. Give your health care provider a list of all the medicines, herbs, non-prescription drugs, or dietary supplements you use. Also tell them if you smoke, drink alcohol, or use illegal drugs. Some items may interact with your medicine. What should I watch for while using this medication? Your condition will be monitored carefully while you are receiving this medication. You may need blood work while taking this medication. This medication may cause serious skin reactions. They can  happen weeks to months after starting the medication. Contact your care team right away if you notice fevers or flu-like symptoms with a rash. The rash may be red or purple and then turn into blisters or peeling of the skin. You may also notice a red rash with swelling of the face, lips, or lymph nodes in your neck or under your arms. Tell your care team right away if you have any change in your eyesight. Talk to your care team if you may be pregnant. Serious birth defects can occur if you take this medication during pregnancy and for 4 months after the last dose. You will need a negative pregnancy test before starting this medication. Contraception is recommended while taking this medication and for 4 months after the last dose. Your care team can help you find the option that works for you. Do not breastfeed while taking this medication and for 4 months after the last dose. What side effects may  I notice from receiving this medication? Side effects that you should report to your care team as soon as possible: Allergic reactions--skin rash, itching, hives, swelling of the face, lips, tongue, or throat Dry cough, shortness of breath or trouble breathing Eye pain, redness, irritation, or discharge with blurry or decreased vision Heart muscle inflammation--unusual weakness or fatigue, shortness of breath, chest pain, fast or irregular heartbeat, dizziness, swelling of the ankles, feet, or hands Hormone gland problems--headache, sensitivity to light, unusual weakness or fatigue, dizziness, fast or irregular heartbeat, increased sensitivity to cold or heat, excessive sweating, constipation, hair loss, increased thirst or amount of urine, tremors or shaking, irritability Infusion reactions--chest pain, shortness of breath or trouble breathing, feeling faint or lightheaded Kidney injury (glomerulonephritis)--decrease in the amount of urine, red or dark brown urine, foamy or bubbly urine, swelling of the  ankles, hands, or feet Liver injury--right upper belly pain, loss of appetite, nausea, light-colored stool, dark yellow or brown urine, yellowing skin or eyes, unusual weakness or fatigue Pain, tingling, or numbness in the hands or feet, muscle weakness, change in vision, confusion or trouble speaking, loss of balance or coordination, trouble walking, seizures Rash, fever, and swollen lymph nodes Redness, blistering, peeling, or loosening of the skin, including inside the mouth Sudden or severe stomach pain, bloody diarrhea, fever, nausea, vomiting Side effects that usually do not require medical attention (report to your care team if they continue or are bothersome): Bone, joint, or muscle pain Diarrhea Fatigue Loss of appetite Nausea Skin rash This list may not describe all possible side effects. Call your doctor for medical advice about side effects. You may report side effects to FDA at 1-800-FDA-1088. Where should I keep my medication? This medication is given in a hospital or clinic. It will not be stored at home. NOTE: This sheet is a summary. It may not cover all possible information. If you have questions about this medicine, talk to your doctor, pharmacist, or health care provider.  2023 Elsevier/Gold Standard (2021-06-27 00:00:00)       To help prevent nausea and vomiting after your treatment, we encourage you to take your nausea medication as directed.  BELOW ARE SYMPTOMS THAT SHOULD BE REPORTED IMMEDIATELY: *FEVER GREATER THAN 100.4 F (38 C) OR HIGHER *CHILLS OR SWEATING *NAUSEA AND VOMITING THAT IS NOT CONTROLLED WITH YOUR NAUSEA MEDICATION *UNUSUAL SHORTNESS OF BREATH *UNUSUAL BRUISING OR BLEEDING *URINARY PROBLEMS (pain or burning when urinating, or frequent urination) *BOWEL PROBLEMS (unusual diarrhea, constipation, pain near the anus) TENDERNESS IN MOUTH AND THROAT WITH OR WITHOUT PRESENCE OF ULCERS (sore throat, sores in mouth, or a toothache) UNUSUAL RASH,  SWELLING OR PAIN  UNUSUAL VAGINAL DISCHARGE OR ITCHING   Items with * indicate a potential emergency and should be followed up as soon as possible or go to the Emergency Department if any problems should occur.  Please show the CHEMOTHERAPY ALERT CARD or IMMUNOTHERAPY ALERT CARD at check-in to the Emergency Department and triage nurse.  Should you have questions after your visit or need to cancel or reschedule your appointment, please contact [facility] [phone]  and follow the prompts.  Office hours are 8:00 a.m. to 4:30 p.m. Monday - Friday. Please note that voicemails left after 4:00 p.m. may not be returned until the following business day.  We are closed weekends and major holidays. You have access to a nurse at all times for urgent questions. Please call the main number to the clinic [phone] and follow the prompts.  For any non-urgent questions, you may also contact your provider using MyChart. We now offer e-Visits for anyone 19 and older to request care online for non-urgent symptoms. For details visit [email or web address].   Also download the MyChart app! Go to the app store, search "MyChart", open the app, select [facility], and log in with your MyChart username and password.  Masks are optional in the cancer centers. If you would like for your care team to wear a mask while they are taking care of you, please let them know. You may have one support person who is at least 70 years old accompany you for your appointments.

## 2021-11-03 NOTE — Progress Notes (Signed)
Denise [name],  [id number]   CLINIC:  Medical Oncology/Hematology  PCP:  Iona Beard, [address] / [place] [address] [phone]   REASON FOR VISIT:  Follow-up for stage IV adenocarcinoma of the lung, negative for targetable mutations  PRIOR THERAPY:  1. Chemoradiation with carboplatin and paclitaxel from 01/07/2019 to 02/11/2019. 2. Consolidation with durvalumab from 03/19/2019 to 04/16/2019, held due to pneumonitis.  NGS Results: not done  CURRENT THERAPY: Consolidation with durvalumab every 2 weeks; brain mets SRS in 5 fractions  BRIEF ONCOLOGIC HISTORY:  Oncology History  Adenocarcinoma of left lung ([place])  12/09/2018 Initial Diagnosis   Adenocarcinoma of left lung ([place])   01/02/2019 Cancer Staging   Staging form: Lung, AJCC 8th Edition - Clinical stage from 01/02/2019: Stage IIIB (cT3, cN2, cM0) - Signed by Derek Jack, MD on 01/02/2019   01/07/2019 - 02/11/2019 Chemotherapy   The patient had palonosetron (ALOXI) injection 0.25 mg, 0.25 mg, Intravenous,  Once, 6 of 6 cycles Administration: 0.25 mg (01/07/2019), 0.25 mg (01/14/2019), 0.25 mg (01/21/2019), 0.25 mg (01/28/2019), 0.25 mg (02/04/2019), 0.25 mg (02/11/2019) CARBOplatin (PARAPLATIN) 270 mg in sodium chloride 0.9 % 250 mL chemo infusion, 270 mg (100 % of original dose 266.4 mg), Intravenous,  Once, 6 of 6 cycles Dose modification:   (original dose 266.4 mg, Cycle 1),   (original dose 266.4 mg, Cycle 2),   (original dose 266.4 mg, Cycle 3),   (original dose 266.4 mg, Cycle 4) Administration: 270 mg (01/07/2019), 270 mg (01/14/2019), 270 mg (01/21/2019), 270 mg (01/28/2019), 270 mg (02/04/2019), 270 mg (02/11/2019) PACLitaxel (TAXOL) 108 mg in sodium chloride 0.9 % 250 mL chemo infusion (</= 59m/m2), 45 mg/m2 = 108 mg, Intravenous,  Once, 6 of 6 cycles Administration: 108 mg (01/07/2019), 108 mg (01/14/2019), 108 mg (01/21/2019), 108 mg (01/28/2019), 108  mg (02/04/2019), 108 mg (02/11/2019) fosaprepitant (EMEND) 150 mg, dexamethasone (DECADRON) 12 mg in sodium chloride 0.9 % 145 mL IVPB, , Intravenous,  Once, 5 of 5 cycles Administration:  (01/14/2019),  (01/21/2019),  (01/28/2019),  (02/04/2019),  (02/11/2019)  for chemotherapy treatment.    03/19/2019 - 07/23/2020 Chemotherapy   Patient is on Treatment Plan : LUNG DURVALUMAB [id number]    09/22/2021 -  Chemotherapy   Patient is on Treatment Plan : LUNG Carboplatin (5) + Pemetrexed (500) + Pembrolizumab (200) D1 q21d Induction x 4 cycles / Maintenance Pemetrexed (500) + Pembrolizumab (200) D1 q21d       CANCER STAGING:  Cancer Staging  Adenocarcinoma of left lung ([place] Staging form: Lung, AJCC 8th Edition - Clinical stage from 01/02/2019: Stage IIIB (cT3, cN2, cM0) - Signed by KDerek Jack MD on 01/02/2019 - Pathologic stage from 09/07/2021: Stage IVA (pTX, pNX, pM1b) - Unsigned   INTERVAL HISTORY:  Denise [name] a 71y.o. female, seen for follow-up of metastatic lung cancer to the liver.  She has completed second cycle on 10/13/2021.  She had some nausea for which Compazine helped.  She had diarrhea for 1 day and Imodium helped.  She has leg swellings.  Denies any fevers or infections.   REVIEW OF SYSTEMS:  Review of Systems  Constitutional:  Negative for appetite change and fatigue.  Eyes:  Negative for eye problems.  Respiratory:  Positive for shortness of breath (with exertion). Negative for cough.   Gastrointestinal:  Positive for nausea. Negative for diarrhea.  Musculoskeletal:  Positive for arthralgias (7/10 R knee).  Skin:  Negative for itching.  All other systems reviewed and are negative.   PAST MEDICAL/SURGICAL HISTORY:  Past Medical History:  Diagnosis Date   Anemia    Aortic stenosis    Arthritis    Coronary artery calcification seen on CT scan    Essential hypertension    GERD (gastroesophageal reflux disease)    History of lung cancer    Stage III  adenocarcinoma status post chemoradiation   Port-A-Cath in place 01/06/2019   Type 2 diabetes mellitus ([place])    Past Surgical History:  Procedure Laterality Date   CHOLECYSTECTOMY  1997   COLONOSCOPY     GASTRIC BYPASS     INCISIONAL HERNIA REPAIR  04/11/11   IR IMAGING GUIDED PORT INSERTION  12/27/2018   Right   IR US GUIDE BX ASP/DRAIN  08/08/2021   LAPAROSCOPIC SALPINGOOPHERECTOMY     LAPAROTOMY  04/11/2011   Procedure: EXPLORATORY LAPAROTOMY;  Surgeon: Joyice Faster. Cornett, MD;  Location: [facility];  Service: General;  Laterality: N/A;  closure port hole   RIGHT/LEFT HEART CATH AND CORONARY ANGIOGRAPHY N/A 12/29/2019   Procedure: RIGHT/LEFT HEART CATH AND CORONARY ANGIOGRAPHY;  Surgeon: Troy Sine, MD;  Location: [facility];  Service: Cardiovascular;  Laterality: N/A;   TOTAL HIP ARTHROPLASTY  03/07/2012   Procedure: TOTAL HIP ARTHROPLASTY ANTERIOR APPROACH;  Surgeon: Mauri Pole, MD;  Location: [facility];  Service: Orthopedics;  Laterality: Right;   TOTAL SHOULDER ARTHROPLASTY Left 01/21/2015   TOTAL SHOULDER ARTHROPLASTY Left 01/21/2015   Procedure: LEFT TOTAL SHOULDER ARTHROPLASTY;  Surgeon: Justice Britain, MD;  Location: [facility];  Service: Orthopedics;  Laterality: Left;   VAGINAL HYSTERECTOMY      SOCIAL HISTORY:  Social History   Socioeconomic History   Marital status: Single    Spouse name: Not on file   Number of children: Not on file   Years of education: 12th grade   Highest education level: Not on file  Occupational History   Occupation: Employed    Employer: [employer]  Tobacco Use   Smoking status: Former    Packs/day: 1.00    Years: 12.00    Total pack years: 12.00    Types: Cigarettes    Quit date: 03/20/1976    Years since quitting: 45.6   Smokeless tobacco: Never  Vaping Use   Vaping Use: Never used  Substance and Sexual Activity   Alcohol use: No   Drug use: No   Sexual activity: Not Currently  Other Topics Concern   Not on file   Social History Narrative   Not on file   Social Determinants of Health   Financial Resource Strain: Low Risk  (12/06/2018)   Overall Financial Resource Strain (CARDIA)    Difficulty of Paying Living Expenses: Not very hard  Food Insecurity: No Food Insecurity (12/06/2018)   Hunger Vital Sign    Worried About Running Out of Food in the Last Year: Never true    Ran Out of Food in the Last Year: Never true  Transportation Needs: No Transportation Needs (12/06/2018)   PRAPARE - Hydrologist (Medical): No    Lack of Transportation (Non-Medical): No  Physical Activity: Inactive (12/06/2018)   Exercise Vital Sign    Days of Exercise per Week: 0 days    Minutes of Exercise per Session: 0 min  Stress: No Stress Concern Present (12/06/2018)   [place]    Feeling of Stress : Only a  little  Social Connections: Moderately Isolated (12/06/2018)   Social Connection and Isolation Panel [NHANES]    Frequency of Communication with Friends and Family: More than three times a week    Frequency of Social Gatherings with Friends and Family: Once a week    Attends Religious Services: More than 4 times per year    Active Member of [employer] or Organizations: No    Attends Archivist Meetings: Never    Marital Status: Never married  Intimate Partner Violence: Not At Risk (12/06/2018)   Humiliation, Afraid, Rape, and Kick questionnaire    Fear of Current or Ex-Partner: No    Emotionally Abused: No    Physically Abused: No    Sexually Abused: No    FAMILY HISTORY:  Family History  Problem Relation Age of Onset   Breast cancer Mother    COPD Mother    Arthritis Mother    Diabetes Mother    Hypertension Mother    Hypertension Father    Diabetes Father    Breast cancer Sister    Thyroid cancer Brother    Huntington's disease Maternal Grandmother    Heart attack Brother     CURRENT MEDICATIONS:   Current Outpatient Medications  Medication Sig Dispense Refill   albuterol (VENTOLIN HFA) 108 (90 Base) MCG/ACT inhaler TAKE 2 PUFFS BY MOUTH EVERY 6 HOURS AS NEEDED FOR WHEEZE OR SHORTNESS OF BREATH (Patient taking differently: Inhale 2 puffs into the lungs every 6 (six) hours as needed for wheezing or shortness of breath.) 18 each 6   apixaban (ELIQUIS) 5 MG TABS tablet Take 1 tablet (5 mg total) by mouth 2 (two) times daily. 60 tablet 6   Calcium Carbonate Antacid (TUMS PO) Take 4 tablets by mouth daily.     carvedilol (COREG) 3.125 MG tablet Take 1 tablet (3.125 mg total) by mouth 2 (two) times daily. 180 tablet 1   Cyanocobalamin (VITAMIN B-12) 2500 MCG SUBL Place 2,500 mcg under the tongue every morning.      diphenhydrAMINE (BENADRYL) 25 MG tablet Take 25 mg by mouth every 6 (six) hours as needed for itching or allergies.     ferrous sulfate 325 (65 FE) MG tablet Take 325 mg by mouth every evening.     folic acid (FOLVITE) 1 MG tablet Take 1 tablet (1 mg total) by mouth daily. 30 tablet 4   furosemide (LASIX) 20 MG tablet Take 20 mg by mouth as needed.     gabapentin (NEURONTIN) 300 MG capsule Take 300 mg by mouth at bedtime as needed (Nerve Pain).     Multiple Vitamin (MULITIVITAMIN WITH MINERALS) TABS Take 1 tablet by mouth daily.     omeprazole (PRILOSEC) 20 MG capsule Take 20 mg by mouth daily.     Potassium Chloride ER 20 MEQ TBCR Take 1 tablet by mouth daily.     prochlorperazine (COMPAZINE) 10 MG tablet Take 1 tablet (10 mg total) by mouth every 6 (six) hours as needed for nausea or vomiting. (Patient not taking: Reported on 11/03/2021) 30 tablet 3   No current facility-administered medications for this visit.    ALLERGIES:  No Known Allergies  PHYSICAL EXAM:  Performance status (ECOG): 1 - Symptomatic but completely ambulatory  There were no vitals filed for this visit. Wt Readings from Last 3 Encounters:  11/03/21 246 lb 7.6 oz (111.8 kg)  10/27/21 244 lb 12.8 oz (111  kg)  10/13/21 249 lb (112.9 kg)   Physical Exam Vitals reviewed.  Constitutional:  Appearance: Normal appearance.     Comments: In wheelchair  Cardiovascular:     Rate and Rhythm: Normal rate and regular rhythm.     Pulses: Normal pulses.     Heart sounds: Murmur heard.     Systolic murmur is present.  Pulmonary:     Effort: Pulmonary effort is normal.     Breath sounds: Normal breath sounds.  Musculoskeletal:     Right lower leg: No edema.     Left lower leg: No edema.  Neurological:     General: No focal deficit present.     Mental Status: She is alert and oriented to person, place, and time.  Psychiatric:        Mood and Affect: Mood normal.        Behavior: Behavior normal.     LABORATORY DATA:  I have reviewed the labs as listed.     Latest Ref Rng & Units 11/03/2021   10:18 AM 10/13/2021    9:06 AM 09/22/2021    7:57 AM  CBC  WBC 4.0 - 10.5 K/uL 2.5  3.9  4.8   Hemoglobin 12.0 - 15.0 g/dL 10.8  11.4  11.7   Hematocrit 36.0 - 46.0 % 33.6  36.0  38.0   Platelets 150 - 400 K/uL 225  242  321       Latest Ref Rng & Units 10/13/2021    9:06 AM 09/22/2021    7:57 AM 08/09/2021    3:55 AM  CMP  Glucose 70 - 99 mg/dL 98  107  100   BUN 8 - 23 mg/dL '8  11  8   ' Creatinine 0.44 - 1.00 mg/dL 0.85  0.85  0.89   Sodium 135 - 145 mmol/L 141  142  142   Potassium 3.5 - 5.1 mmol/L 3.9  3.5  3.7   Chloride 98 - 111 mmol/L 110  111  110   CO2 22 - 32 mmol/L '26  26  24   ' Calcium 8.9 - 10.3 mg/dL 8.6  8.5  8.8   Total Protein 6.5 - 8.1 g/dL 6.1  6.4    Total Bilirubin 0.3 - 1.2 mg/dL 0.5  0.3    Alkaline Phos 38 - 126 U/L 83  76    AST 15 - 41 U/L 16  15    ALT 0 - 44 U/L 19  13      DIAGNOSTIC IMAGING:  I have independently reviewed the scans and discussed with the patient. No results found.   ASSESSMENT:  1.  Adenocarcinoma of left lung ([facility]) -Chemoradiation therapy with carboplatin and paclitaxel from 01/07/2019 through 02/11/2019. -Consolidation immunotherapy  with durvalumab from 03/19/2019 through 04/15/2018, held due to pneumonitis. -CT chest on 07/02/2019 shows left upper lobe lung mass measuring 2.6 x 2.0 cm.  It shows improvement in size. -MRI of the brain on 07/18/2019 showed left sphenoid wing meningioma measuring 2.6 x 2.0 x 2.6 cm unchanged.  No new enhancing intracranial lesion.  Increased left temporal white matter edema. -Durvalumab restarted on 07/24/2019. -She was evaluated by cardiology with a stress test.  EF was 46%.  She underwent cardiac catheterization which did not show any abnormalities. - 1 year of durvalumab completed on 07/23/2020. - Liver mass biopsy (08/08/2021): Adenocarcinoma, CK7 positive, negative for TTF-1, Napsin a, GATA3, ER, CK20, CDX2 - PET scan (08/25/2021): Right upper lobe lung nodule 9 mm, SUV 5.0.  Groundglass and solid nodule periphery of the right upper lobe 8 mm, SUV 2.45.  Right lobe liver mass 4.5 x 3.6 cm, SUV 7.78. - NGS testing: PD-L1 negative, TMB-low, MSI-stable, K-ras G12 D.  No other targetable mutations. - Carboplatin, pemetrexed and pembrolizumab started 09/22/21   PLAN:  1.  Metastatic adenocarcinoma of the lung to the liver and right lung: - She has tolerated cycle 2 reasonably well.  She had some nausea for which Compazine helps.  Diarrhea for 1 day with help from Imodium. - Reviewed labs today which showed normal LFTs with low albumin.  CBC shows white count is 2.5 with ANC of 1.5. - Recommend proceeding cycle 3 today.  Recommend long-acting G-CSF to be added to the current cycle. - RTC 3 weeks for follow-up with repeat CT CAP to evaluate response. - I have told her to start back on Lasix 20 mg as needed with potassium for 2+ leg swelling.   2.  Meningioma: - MRI of the brain on 09/27/2021 with stable meningioma with no new lesions.  3.  Unprovoked pulmonary embolism: - Continue Eliquis 5 mg twice daily indefinitely.  No bleeding issues.   Orders placed this encounter:  No orders of the defined  types were placed in this encounter.    Derek Jack, MD [facility] [phone]

## 2021-11-03 NOTE — Progress Notes (Signed)
Patient presents today for treatment and follow up visit with Dr. Delton Coombes. Labs pending. Vital signs within parameters for treatment. Message received from A. Ouida Sills RN / Dr. Delton Coombes to proceed with treatment pending CMP results. Last TSH 2.721 on 10/13/2021 C2. Message received to add Udenyca to patient's plan moving forward per A. Anderson RN/ Dr. Delton Coombes. [facility] aware. Patient will get B12 injection today with tx.   Labs resulted and within parameters for treatment.   Treatment given today per MD orders. Tolerated infusion without adverse affects. Vital signs stable. No complaints at this time. Discharged from clinic by wheel chair in stable condition. Alert and oriented x 3. F/U with [facility] as scheduled.

## 2021-11-04 ENCOUNTER — Other Ambulatory Visit (HOSPITAL_COMMUNITY): Payer: Self-pay | Admitting: Hematology

## 2021-11-04 ENCOUNTER — Inpatient Hospital Stay: Payer: Medicare HMO

## 2021-11-04 VITALS — BP 119/64 | HR 80 | Temp 96.6°F | Resp 20

## 2021-11-04 DIAGNOSIS — I2699 Other pulmonary embolism without acute cor pulmonale: Secondary | ICD-10-CM | POA: Diagnosis not present

## 2021-11-04 DIAGNOSIS — C3492 Malignant neoplasm of unspecified part of left bronchus or lung: Secondary | ICD-10-CM

## 2021-11-04 DIAGNOSIS — Z5189 Encounter for other specified aftercare: Secondary | ICD-10-CM | POA: Diagnosis not present

## 2021-11-04 DIAGNOSIS — C787 Secondary malignant neoplasm of liver and intrahepatic bile duct: Secondary | ICD-10-CM | POA: Diagnosis not present

## 2021-11-04 DIAGNOSIS — Z7901 Long term (current) use of anticoagulants: Secondary | ICD-10-CM | POA: Diagnosis not present

## 2021-11-04 DIAGNOSIS — Z5112 Encounter for antineoplastic immunotherapy: Secondary | ICD-10-CM | POA: Diagnosis not present

## 2021-11-04 DIAGNOSIS — D329 Benign neoplasm of meninges, unspecified: Secondary | ICD-10-CM | POA: Diagnosis not present

## 2021-11-04 DIAGNOSIS — Z5111 Encounter for antineoplastic chemotherapy: Secondary | ICD-10-CM | POA: Diagnosis not present

## 2021-11-04 MED ORDER — PEGFILGRASTIM-CBQV 6 MG/0.6ML ~~LOC~~ SOSY
6.0000 mg | PREFILLED_SYRINGE | Freq: Once | SUBCUTANEOUS | Status: AC
  Administered 2021-11-04: 6 mg via SUBCUTANEOUS
  Filled 2021-11-04: qty 0.6

## 2021-11-04 NOTE — Progress Notes (Signed)
Patient tolerated Udenyca injection with no complaints voiced.  Site clean and dry with no bruising or swelling noted.  No complaints of pain.  Discharged with vital signs stable and no signs or symptoms of distress noted.  

## 2021-11-04 NOTE — Patient Instructions (Signed)
[facility]  Discharge Instructions: Thank you for choosing [facility] to provide your oncology and hematology care.  If you have a lab appointment with the [facility], please come in thru the Main Entrance and check in at the main information desk.  Wear comfortable clothing and clothing appropriate for easy access to any Portacath or PICC line.   We strive to give you quality time with your provider. You may need to reschedule your appointment if you arrive late (15 or more minutes).  Arriving late affects you and other patients whose appointments are after yours.  Also, if you miss three or more appointments without notifying the office, you may be dismissed from the clinic at the provider's discretion.      For prescription refill requests, have your pharmacy contact our office and allow 72 hours for refills to be completed.    Today you received the following chemotherapy and/or immunotherapy agents Udenyca.  Pegfilgrastim Injection What is this medication? PEGFILGRASTIM (PEG fil gra stim) lowers the risk of infection in people who are receiving chemotherapy. It works by Building control surveyor make more white blood cells, which protects your body from infection. It may also be used to help people who have been exposed to high doses of radiation. This medicine may be used for other purposes; ask your health care provider or pharmacist if you have questions. COMMON BRAND NAME(S): [employer], Neulasta, Nyvepria, Stimufend, UDENYCA, Ziextenzo What should I tell my care team before I take this medication? They need to know if you have any of these conditions: Kidney disease Latex allergy Ongoing radiation therapy Sickle cell disease Skin reactions to acrylic adhesives (On-Body Injector only) An unusual or allergic reaction to pegfilgrastim, filgrastim, other medications, foods, dyes, or preservatives Pregnant or trying to get  pregnant Breast-feeding How should I use this medication? This medication is for injection under the skin. If you get this medication at home, you will be taught how to prepare and give the pre-filled syringe or how to use the On-body Injector. Refer to the patient Instructions for Use for detailed instructions. Use exactly as directed. Tell your care team immediately if you suspect that the On-body Injector may not have performed as intended or if you suspect the use of the On-body Injector resulted in a missed or partial dose. It is important that you put your used needles and syringes in a special sharps container. Do not put them in a trash can. If you do not have a sharps container, call your pharmacist or care team to get one. Talk to your care team about the use of this medication in children. While this medication may be prescribed for selected conditions, precautions do apply. Overdosage: If you think you have taken too much of this medicine contact a poison control center or emergency room at once. NOTE: This medicine is only for you. Do not share this medicine with others. What if I miss a dose? It is important not to miss your dose. Call your care team if you miss your dose. If you miss a dose due to an On-body Injector failure or leakage, a new dose should be administered as soon as possible using a single prefilled syringe for manual use. What may interact with this medication? Interactions have not been studied. This list may not describe all possible interactions. Give your health care provider a list of all the medicines, herbs, non-prescription drugs, or dietary supplements you use. Also tell  them if you smoke, drink alcohol, or use illegal drugs. Some items may interact with your medicine. What should I watch for while using this medication? Your condition will be monitored carefully while you are receiving this medication. You may need blood work done while you are taking this  medication. Talk to your care team about your risk of cancer. You may be more at risk for certain types of cancer if you take this medication. If you are going to need a MRI, CT scan, or other procedure, tell your care team that you are using this medication (On-Body Injector only). What side effects may I notice from receiving this medication? Side effects that you should report to your care team as soon as possible: Allergic reactions--skin rash, itching, hives, swelling of the face, lips, tongue, or throat Capillary leak syndrome--stomach or muscle pain, unusual weakness or fatigue, feeling faint or lightheaded, decrease in the amount of urine, swelling of the ankles, hands, or feet, trouble breathing High white blood cell level--fever, fatigue, trouble breathing, night sweats, change in vision, weight loss Inflammation of the aorta--fever, fatigue, back, chest, or stomach pain, severe headache Kidney injury (glomerulonephritis)--decrease in the amount of urine, red or dark Darron Stuck urine, foamy or bubbly urine, swelling of the ankles, hands, or feet Shortness of breath or trouble breathing Spleen injury--pain in upper left stomach or shoulder Unusual bruising or bleeding Side effects that usually do not require medical attention (report to your care team if they continue or are bothersome): Bone pain Pain in the hands or feet This list may not describe all possible side effects. Call your doctor for medical advice about side effects. You may report side effects to FDA at 1-800-FDA-1088. Where should I keep my medication? Keep out of the reach of children. If you are using this medication at home, you will be instructed on how to store it. Throw away any unused medication after the expiration date on the label. NOTE: This sheet is a summary. It may not cover all possible information. If you have questions about this medicine, talk to your doctor, pharmacist, or health care provider.  2023  Elsevier/Gold Standard (2013-06-06 00:00:00)        To help prevent nausea and vomiting after your treatment, we encourage you to take your nausea medication as directed.  BELOW ARE SYMPTOMS THAT SHOULD BE REPORTED IMMEDIATELY: *FEVER GREATER THAN 100.4 F (38 C) OR HIGHER *CHILLS OR SWEATING *NAUSEA AND VOMITING THAT IS NOT CONTROLLED WITH YOUR NAUSEA MEDICATION *UNUSUAL SHORTNESS OF BREATH *UNUSUAL BRUISING OR BLEEDING *URINARY PROBLEMS (pain or burning when urinating, or frequent urination) *BOWEL PROBLEMS (unusual diarrhea, constipation, pain near the anus) TENDERNESS IN MOUTH AND THROAT WITH OR WITHOUT PRESENCE OF ULCERS (sore throat, sores in mouth, or a toothache) UNUSUAL RASH, SWELLING OR PAIN  UNUSUAL VAGINAL DISCHARGE OR ITCHING   Items with * indicate a potential emergency and should be followed up as soon as possible or go to the Emergency Department if any problems should occur.  Please show the CHEMOTHERAPY ALERT CARD or IMMUNOTHERAPY ALERT CARD at check-in to the Emergency Department and triage nurse.  Should you have questions after your visit or need to cancel or reschedule your appointment, please contact [facility] [phone]  and follow the prompts.  Office hours are 8:00 a.m. to 4:30 p.m. Monday - Friday. Please note that voicemails left after 4:00 p.m. may not be returned until the following business day.  We are closed weekends and major  holidays. You have access to a nurse at all times for urgent questions. Please call the main number to the clinic [phone] and follow the prompts.  For any non-urgent questions, you may also contact your provider using MyChart. We now offer e-Visits for anyone 35 and older to request care online for non-urgent symptoms. For details visit [email or web address].   Also download the MyChart app! Go to the app store, search "MyChart", open the app, select [facility], and log in with your MyChart username and  password.  Masks are optional in the cancer centers. If you would like for your care team to wear a mask while they are taking care of you, please let them know. You may have one support person who is at least 70 years old accompany you for your appointments.

## 2021-11-07 ENCOUNTER — Telehealth: Payer: Self-pay | Admitting: *Deleted

## 2021-11-07 ENCOUNTER — Ambulatory Visit
Admission: EM | Admit: 2021-11-07 | Discharge: 2021-11-07 | Disposition: A | Discharge status: Home / Self Care | Payer: Medicare HMO | Attending: Family Medicine | Admitting: Family Medicine

## 2021-11-07 DIAGNOSIS — Z923 Personal history of irradiation: Secondary | ICD-10-CM | POA: Insufficient documentation

## 2021-11-07 DIAGNOSIS — Z7951 Long term (current) use of inhaled steroids: Secondary | ICD-10-CM | POA: Insufficient documentation

## 2021-11-07 DIAGNOSIS — R42 Dizziness and giddiness: Secondary | ICD-10-CM | POA: Diagnosis not present

## 2021-11-07 DIAGNOSIS — Z20822 Contact with and (suspected) exposure to covid-19: Secondary | ICD-10-CM | POA: Insufficient documentation

## 2021-11-07 DIAGNOSIS — Z9221 Personal history of antineoplastic chemotherapy: Secondary | ICD-10-CM | POA: Insufficient documentation

## 2021-11-07 DIAGNOSIS — Z85118 Personal history of other malignant neoplasm of bronchus and lung: Secondary | ICD-10-CM | POA: Diagnosis not present

## 2021-11-07 DIAGNOSIS — Z86711 Personal history of pulmonary embolism: Secondary | ICD-10-CM | POA: Diagnosis not present

## 2021-11-07 DIAGNOSIS — R0602 Shortness of breath: Secondary | ICD-10-CM | POA: Diagnosis not present

## 2021-11-07 DIAGNOSIS — Z9981 Dependence on supplemental oxygen: Secondary | ICD-10-CM | POA: Diagnosis not present

## 2021-11-07 DIAGNOSIS — J069 Acute upper respiratory infection, unspecified: Secondary | ICD-10-CM | POA: Diagnosis not present

## 2021-11-07 LAB — RESP PANEL BY RT-PCR (FLU A&B, COVID) ARPGX2
Influenza A by PCR: NEGATIVE
Influenza B by PCR: NEGATIVE
SARS Coronavirus 2 by RT PCR: NEGATIVE

## 2021-11-07 MED ORDER — BENZONATATE 100 MG PO CAPS
100.0000 mg | ORAL_CAPSULE | Freq: Three times a day (TID) | ORAL | 0 refills | Status: DC | PRN
Start: 2021-11-07 — End: 2022-03-16

## 2021-11-07 MED ORDER — FLUTICASONE PROPIONATE 50 MCG/ACT NA SUSP: 1.0000 | Freq: Two times a day (BID) | NASAL | 2 refills | Status: DC

## 2021-11-07 NOTE — ED Triage Notes (Signed)
Pt presents with c/o dizziness and sore throat since this morning. Pt states she usually is dizzy after chemo and states she is unaware if the chemo medicine is causing her to have a sore throat.

## 2021-11-07 NOTE — Discharge Instructions (Signed)
Return or go to the emergency department if your symptoms worsen or do not improve

## 2021-11-07 NOTE — Telephone Encounter (Signed)
Patient called to advise that she has sore throat, ear pain and swollen glands associated with SOB.  Advised to see PCP or urgent care today if possible and call us with the outcome.  Verbalized understanding.

## 2021-11-10 ENCOUNTER — Ambulatory Visit (HOSPITAL_COMMUNITY)
Admission: RE | Admit: 2021-11-10 | Discharge: 2021-11-10 | Disposition: A | Discharge status: Home / Self Care | Payer: Medicare HMO | Source: Ambulatory Visit | Attending: Hematology | Admitting: Hematology

## 2021-11-10 DIAGNOSIS — K429 Umbilical hernia without obstruction or gangrene: Secondary | ICD-10-CM | POA: Diagnosis not present

## 2021-11-10 DIAGNOSIS — C3490 Malignant neoplasm of unspecified part of unspecified bronchus or lung: Secondary | ICD-10-CM | POA: Diagnosis not present

## 2021-11-10 DIAGNOSIS — K5730 Diverticulosis of large intestine without perforation or abscess without bleeding: Secondary | ICD-10-CM | POA: Diagnosis not present

## 2021-11-10 DIAGNOSIS — C3492 Malignant neoplasm of unspecified part of left bronchus or lung: Secondary | ICD-10-CM | POA: Insufficient documentation

## 2021-11-10 DIAGNOSIS — D259 Leiomyoma of uterus, unspecified: Secondary | ICD-10-CM | POA: Diagnosis not present

## 2021-11-10 DIAGNOSIS — I358 Other nonrheumatic aortic valve disorders: Secondary | ICD-10-CM | POA: Diagnosis not present

## 2021-11-10 DIAGNOSIS — J984 Other disorders of lung: Secondary | ICD-10-CM | POA: Diagnosis not present

## 2021-11-10 DIAGNOSIS — I3139 Other pericardial effusion (noninflammatory): Secondary | ICD-10-CM | POA: Diagnosis not present

## 2021-11-10 MED ORDER — HEPARIN SOD (PORK) LOCK FLUSH 100 UNIT/ML IV SOLN
INTRAVENOUS | Status: AC
  Filled 2021-11-10: qty 5

## 2021-11-10 MED ORDER — IOHEXOL 300 MG/ML  SOLN
100.0000 mL | Freq: Once | INTRAMUSCULAR | Status: AC | PRN
  Administered 2021-11-10: 100 mL via INTRAVENOUS

## 2021-11-11 NOTE — ED Provider Notes (Signed)
[facility]    CSN: [id number] Arrival date & time: 11/07/21  1111      History   Chief Complaint Chief Complaint  Patient presents with   Dizziness   Sore Throat    HPI Denise [name] is a 71 y.o. female.   Patient presenting today with 1 day history of sore, scratchy feeling throat.  She states has been having some dizzy spells the past few days but that this is common for her after her chemo treatments which she just had several days prior.  She also notes some minor postnasal drip, runny nose but denies significant cough, chest pain, shortness of breath beyond baseline, fever, chills, body aches, abdominal pain, nausea vomiting or diarrhea.  No known sick contacts that she is aware of.  She is on as needed supplemental O2 at home, has not been wearing it often lately and notes that her oxygen is typically in the low 90s, sometimes dipping into the 80s with ambulation.  She feels at her typical baseline breathing status.  History of adenocarcinoma of the left lung, history of pulmonary embolism, aortic stenosis.  Has an albuterol inhaler at home that she has not been taking often.  Not trying anything over-the-counter for symptoms.    Past Medical History:  Diagnosis Date   Anemia    Aortic stenosis    Arthritis    Coronary artery calcification seen on CT scan    Essential hypertension    GERD (gastroesophageal reflux disease)    History of lung cancer    Stage III adenocarcinoma status post chemoradiation   Port-A-Cath in place 01/06/2019   Type 2 diabetes mellitus [facility])     Patient Active Problem List   Diagnosis Date Noted   Intramuscular hematoma 08/03/2021   Pulmonary embolus ([place]) 07/17/2021   Type 2 diabetes mellitus ([place])    Essential hypertension    History of lung cancer    Class 3 obesity ([place])    Hypokalemia    Malignant neoplasm metastatic to brain ([place]) 06/04/2020   Meningioma, cerebral ([place]) 05/18/2020   Abnormal myocardial perfusion study     Aortic stenosis    DOE (dyspnea on exertion) 10/09/2019   Meningioma ([place]) 03/20/2019   Port-A-Cath in place 01/06/2019   Adenocarcinoma of left lung ([place]) 12/09/2018   S/P shoulder replacement 01/21/2015   Expected blood loss anemia 03/08/2012   Morbid obesity with BMI of 45.0-49.9, adult ([place]) 03/08/2012   S/P right THA, AA 03/07/2012   Small bowel obstruction ([place]) 04/11/2011   Incisional hernia with obstruction 04/11/2011   Pelvic mass 03/29/2011   Radicular low back pain 11/03/2010   Hip arthritis 11/03/2010    Past Surgical History:  Procedure Laterality Date   CHOLECYSTECTOMY  1997   COLONOSCOPY     GASTRIC BYPASS     INCISIONAL HERNIA REPAIR  04/11/11   IR IMAGING GUIDED PORT INSERTION  12/27/2018   Right   IR US GUIDE BX ASP/DRAIN  08/08/2021   LAPAROSCOPIC SALPINGOOPHERECTOMY     LAPAROTOMY  04/11/2011   Procedure: EXPLORATORY LAPAROTOMY;  Surgeon: Joyice Faster. Cornett, MD;  Location: [facility];  Service: General;  Laterality: N/A;  closure port hole   RIGHT/LEFT HEART CATH AND CORONARY ANGIOGRAPHY N/A 12/29/2019   Procedure: RIGHT/LEFT HEART CATH AND CORONARY ANGIOGRAPHY;  Surgeon: Troy Sine, MD;  Location: [facility];  Service: Cardiovascular;  Laterality: N/A;   TOTAL HIP ARTHROPLASTY  03/07/2012   Procedure: TOTAL HIP ARTHROPLASTY ANTERIOR APPROACH;  Surgeon: Mauri Pole, MD;  Location: [facility];  Service: Orthopedics;  Laterality: Right;   TOTAL SHOULDER ARTHROPLASTY Left 01/21/2015   TOTAL SHOULDER ARTHROPLASTY Left 01/21/2015   Procedure: LEFT TOTAL SHOULDER ARTHROPLASTY;  Surgeon: Justice Britain, MD;  Location: [facility];  Service: Orthopedics;  Laterality: Left;   VAGINAL HYSTERECTOMY      OB History   No obstetric history on file.      Home Medications    Prior to Admission medications   Medication Sig Start Date End Date Taking? Authorizing Provider  benzonatate (TESSALON) 100 MG capsule Take 1 capsule (100 mg total) by mouth 3 (three) times daily  as needed for cough. 11/07/21  Yes Volney American, PA-C  fluticasone [facility]) 50 MCG/ACT nasal spray Place 1 spray into both nostrils 2 (two) times daily. 11/07/21  Yes Volney American, PA-C  albuterol (VENTOLIN HFA) 108 (90 Base) MCG/ACT inhaler TAKE 2 PUFFS BY MOUTH EVERY 6 HOURS AS NEEDED FOR WHEEZE OR SHORTNESS OF BREATH Patient taking differently: Inhale 2 puffs into the lungs every 6 (six) hours as needed for wheezing or shortness of breath. 07/27/20   Derek Jack, MD  apixaban (ELIQUIS) 5 MG TABS tablet Take 1 tablet (5 mg total) by mouth 2 (two) times daily. 10/13/21 05/11/22  Derek Jack, MD  Calcium Carbonate Antacid (TUMS PO) Take 4 tablets by mouth daily.    [provider]  carvedilol (COREG) 3.125 MG tablet Take 1 tablet (3.125 mg total) by mouth 2 (two) times daily. 10/14/21   Satira Sark, MD  Cyanocobalamin (VITAMIN B-12) 2500 MCG SUBL Place 2,500 mcg under the tongue every morning.     [provider]  diphenhydrAMINE (BENADRYL) 25 MG tablet Take 25 mg by mouth every 6 (six) hours as needed for itching or allergies.    [provider]  ferrous sulfate 325 (65 FE) MG tablet Take 325 mg by mouth every evening.    [provider]  folic acid (FOLVITE) 1 MG tablet TAKE 1 TABLET BY MOUTH EVERY DAY 11/04/21   Derek Jack, MD  furosemide (LASIX) 20 MG tablet Take 20 mg by mouth as needed.    [provider]  gabapentin (NEURONTIN) 300 MG capsule Take 300 mg by mouth at bedtime as needed (Nerve Pain).    [provider]  Multiple Vitamin (MULITIVITAMIN WITH MINERALS) TABS Take 1 tablet by mouth daily.    [provider]  omeprazole (PRILOSEC) 20 MG capsule Take 20 mg by mouth daily. 09/28/18   [provider]  Potassium Chloride ER 20 MEQ TBCR Take 1 tablet by mouth daily. 07/26/21   [provider]  prochlorperazine (COMPAZINE) 10 MG tablet Take 1 tablet (10 mg total) by  mouth every 6 (six) hours as needed for nausea or vomiting. 09/23/21   Derek Jack, MD    Family History Family History  Problem Relation Age of Onset   Breast cancer Mother    COPD Mother    Arthritis Mother    Diabetes Mother    Hypertension Mother    Hypertension Father    Diabetes Father    Breast cancer Sister    Thyroid cancer Brother    Huntington's disease Maternal Grandmother    Heart attack Brother     Social History Social History   Tobacco Use   Smoking status: Former    Packs/day: 1.00    Years: 12.00    Total pack years: 12.00    Types: Cigarettes  Quit date: 03/20/1976    Years since quitting: 45.6   Smokeless tobacco: Never  Vaping Use   Vaping Use: Never used  Substance Use Topics   Alcohol use: No   Drug use: No     Allergies   Patient has no known allergies.   Review of Systems Review of Systems Per HPI  Physical Exam Triage Vital Signs ED Triage Vitals  Enc Vitals Group     BP 11/07/21 1130 111/67     Pulse Rate 11/07/21 1130 88     Resp --      Temp 11/07/21 1130 98.9 F (37.2 C)     Temp Source 11/07/21 1130 Oral     SpO2 11/07/21 1130 90 %     Weight --      Height --      Head Circumference --      Peak Flow --      Pain Score 11/07/21 1129 0     Pain Loc --      Pain Edu? --      Excl. in [place]? --    No data found.  Updated Vital Signs BP 111/67 (BP Location: Right Arm)   Pulse 88   Temp 98.9 F (37.2 C) (Oral)   SpO2 90%   Visual Acuity Right Eye Distance:   Left Eye Distance:   Bilateral Distance:    Right Eye Near:   Left Eye Near:    Bilateral Near:     Physical Exam Vitals and nursing note reviewed.  Constitutional:      Appearance: Normal appearance. She is not ill-appearing.  HENT:     Head: Atraumatic.     Right Ear: Tympanic membrane normal.     Left Ear: Tympanic membrane normal.     Nose: Rhinorrhea present.     Mouth/Throat:     Mouth: Mucous membranes are moist.     Pharynx:  Posterior oropharyngeal erythema present. No oropharyngeal exudate.  Eyes:     Extraocular Movements: Extraocular movements intact.     Conjunctiva/sclera: Conjunctivae normal.  Cardiovascular:     Rate and Rhythm: Normal rate and regular rhythm.     Heart sounds: Normal heart sounds.  Pulmonary:     Effort: Pulmonary effort is normal.     Comments: Breath sounds decreased bilaterally.  No wheezes or rales appreciable on exam.  In no respiratory distress, even prior to application of supplemental O2 she was not tachypneic or laboring despite being around 90% on room air Musculoskeletal:        General: Normal range of motion.     Cervical back: Normal range of motion and neck supple.  Skin:    General: Skin is warm and dry.  Neurological:     Mental Status: She is alert and oriented to person, place, and time.  Psychiatric:        Mood and Affect: Mood normal.        Thought Content: Thought content normal.        Judgment: Judgment normal.      UC Treatments / Results  Labs (all labs ordered are listed, but only abnormal results are displayed) Labs Reviewed  RESP PANEL BY RT-PCR (FLU A&B, COVID) [id number]    EKG   Radiology No results found.  Procedures Procedures (including critical care time)  Medications Ordered in UC Medications - No data to display  Initial Impression / Assessment and Plan / UC Course  I have reviewed the triage  vital signs and the nursing notes.  Pertinent labs & imaging results that were available during my care of the patient were reviewed by me and considered in my medical decision making (see chart for details).     O2 saturations in triage after walking back from the waiting room were hovering around 89 to 92% on room air so she was placed on nasal cannula at 2 L which held her oxygen steady around 98%.  She states this is her baseline and she does not feel more short of breath than usual and has not been coughing or having fevers.  Her  main concern today is the sore throat which is not typically a symptom of her treatments for her so she fears she is getting sick.  She does not wish for the dizziness to be worked up further as she states it is very consistent with her typical chemo side effects.  COVID, flu while pending, discussed supportive over-the-counter medications and Flonase, Tessalon for symptoms.  Return for any worsening symptoms at any time.  Final Clinical Impressions(s) / UC Diagnoses   Final diagnoses:  Viral URI  Dizziness  SOB (shortness of breath)     Discharge Instructions      Return or go to the emergency department if your symptoms worsen or do not improve    ED Prescriptions     Medication Sig Dispense Auth. Provider   fluticasone (FLONASE) 50 MCG/ACT nasal spray Place 1 spray into both nostrils 2 (two) times daily. 16 g Volney American, PA-C   benzonatate (TESSALON) 100 MG capsule Take 1 capsule (100 mg total) by mouth 3 (three) times daily as needed for cough. 21 capsule Volney American, [place]      PDMP not reviewed this encounter.   Merrie Roof [place], [place] 11/11/21 [phone]

## 2021-11-20 ENCOUNTER — Other Ambulatory Visit: Payer: Self-pay | Admitting: Hematology

## 2021-11-20 DIAGNOSIS — C3492 Malignant neoplasm of unspecified part of left bronchus or lung: Secondary | ICD-10-CM

## 2021-11-24 ENCOUNTER — Inpatient Hospital Stay (HOSPITAL_BASED_OUTPATIENT_CLINIC_OR_DEPARTMENT_OTHER): Payer: Medicare HMO | Admitting: Hematology

## 2021-11-24 ENCOUNTER — Inpatient Hospital Stay: Payer: Medicare HMO | Attending: Neurosurgery

## 2021-11-24 ENCOUNTER — Inpatient Hospital Stay: Payer: Medicare HMO

## 2021-11-24 VITALS — BP 93/61 | HR 84 | Temp 97.9°F | Resp 17

## 2021-11-24 DIAGNOSIS — C3492 Malignant neoplasm of unspecified part of left bronchus or lung: Secondary | ICD-10-CM

## 2021-11-24 DIAGNOSIS — Z79899 Other long term (current) drug therapy: Secondary | ICD-10-CM | POA: Insufficient documentation

## 2021-11-24 DIAGNOSIS — Z5189 Encounter for other specified aftercare: Secondary | ICD-10-CM | POA: Insufficient documentation

## 2021-11-24 DIAGNOSIS — D329 Benign neoplasm of meninges, unspecified: Secondary | ICD-10-CM | POA: Insufficient documentation

## 2021-11-24 DIAGNOSIS — I2699 Other pulmonary embolism without acute cor pulmonale: Secondary | ICD-10-CM | POA: Insufficient documentation

## 2021-11-24 DIAGNOSIS — Z5112 Encounter for antineoplastic immunotherapy: Secondary | ICD-10-CM | POA: Diagnosis not present

## 2021-11-24 DIAGNOSIS — Z5111 Encounter for antineoplastic chemotherapy: Secondary | ICD-10-CM | POA: Insufficient documentation

## 2021-11-24 DIAGNOSIS — C787 Secondary malignant neoplasm of liver and intrahepatic bile duct: Secondary | ICD-10-CM | POA: Insufficient documentation

## 2021-11-24 DIAGNOSIS — C3490 Malignant neoplasm of unspecified part of unspecified bronchus or lung: Secondary | ICD-10-CM

## 2021-11-24 DIAGNOSIS — Z7901 Long term (current) use of anticoagulants: Secondary | ICD-10-CM | POA: Diagnosis not present

## 2021-11-24 LAB — COMPREHENSIVE METABOLIC PANEL
ALT: 25 U/L (ref 0–44)
AST: 22 U/L (ref 15–41)
Albumin: 3.3 g/dL — ABNORMAL LOW (ref 3.5–5.0)
Alkaline Phosphatase: 74 U/L (ref 38–126)
Anion gap: 5 (ref 5–15)
BUN: 12 mg/dL (ref 8–23)
CO2: 27 mmol/L (ref 22–32)
Calcium: 8.5 mg/dL — ABNORMAL LOW (ref 8.9–10.3)
Chloride: 109 mmol/L (ref 98–111)
Creatinine, Ser: 0.84 mg/dL (ref 0.44–1.00)
GFR, Estimated: >60 mL/min (ref >60)
Glucose, Bld: 99 mg/dL (ref 70–99)
Potassium: 3.4 mmol/L — ABNORMAL LOW (ref 3.5–5.1)
Sodium: 141 mmol/L (ref 135–145)
Total Bilirubin: 0.5 mg/dL (ref 0.3–1.2)
Total Protein: 6.3 g/dL — ABNORMAL LOW (ref 6.5–8.1)

## 2021-11-24 LAB — CBC WITH DIFFERENTIAL/PLATELET
Abs Immature Granulocytes: 0.02 10*3/uL (ref 0.00–0.07)
Basophils Absolute: 0 10*3/uL (ref 0.0–0.1)
Basophils Relative: 0 %
Eosinophils Absolute: 0 10*3/uL (ref 0.0–0.5)
Eosinophils Relative: 0 %
HCT: 32.3 % — ABNORMAL LOW (ref 36.0–46.0)
Hemoglobin: 10.4 g/dL — ABNORMAL LOW (ref 12.0–15.0)
Immature Granulocytes: 0 %
Lymphocytes Relative: 13 %
Lymphs Abs: 0.7 10*3/uL (ref 0.7–4.0)
MCH: 28.3 pg (ref 26.0–34.0)
MCHC: 32.2 g/dL (ref 30.0–36.0)
MCV: 87.8 fL (ref 80.0–100.0)
Monocytes Absolute: 0.6 10*3/uL (ref 0.1–1.0)
Monocytes Relative: 12 %
Neutro Abs: 4 10*3/uL (ref 1.7–7.7)
Neutrophils Relative %: 75 %
Platelets: 237 10*3/uL (ref 150–400)
RBC: 3.68 MIL/uL — ABNORMAL LOW (ref 3.87–5.11)
RDW: 18.5 % — ABNORMAL HIGH (ref 11.5–15.5)
WBC: 5.4 10*3/uL (ref 4.0–10.5)
nRBC: 0 % (ref 0.0–0.2)

## 2021-11-24 LAB — MAGNESIUM: Magnesium: 1.6 mg/dL — ABNORMAL LOW (ref 1.7–2.4)

## 2021-11-24 LAB — TSH: TSH: 1.995 u[IU]/mL (ref 0.350–4.500)

## 2021-11-24 MED ORDER — SODIUM CHLORIDE 0.9 % IV SOLN
10.0000 mg | Freq: Once | INTRAVENOUS | Status: AC
  Administered 2021-11-24: 10 mg via INTRAVENOUS
  Filled 2021-11-24: qty 10

## 2021-11-24 MED ORDER — HEPARIN SOD (PORK) LOCK FLUSH 100 UNIT/ML IV SOLN
500.0000 [IU] | Freq: Once | INTRAVENOUS | Status: AC | PRN
  Administered 2021-11-24: 500 [IU]

## 2021-11-24 MED ORDER — MAGNESIUM SULFATE 2 GM/50ML IV SOLN
INTRAVENOUS | Status: AC
  Filled 2021-11-24: qty 50

## 2021-11-24 MED ORDER — SODIUM CHLORIDE 0.9 % IV SOLN
200.0000 mg | Freq: Once | INTRAVENOUS | Status: AC
  Administered 2021-11-24: 200 mg via INTRAVENOUS
  Filled 2021-11-24: qty 8

## 2021-11-24 MED ORDER — SODIUM CHLORIDE 0.9 % IV SOLN
587.0000 mg | Freq: Once | INTRAVENOUS | Status: AC
  Administered 2021-11-24: 590 mg via INTRAVENOUS
  Filled 2021-11-24: qty 59

## 2021-11-24 MED ORDER — SODIUM CHLORIDE 0.9 % IV SOLN
150.0000 mg | Freq: Once | INTRAVENOUS | Status: AC
  Administered 2021-11-24: 150 mg via INTRAVENOUS
  Filled 2021-11-24: qty 150

## 2021-11-24 MED ORDER — SODIUM CHLORIDE 0.9 % IV SOLN
500.0000 mg/m2 | Freq: Once | INTRAVENOUS | Status: AC
  Administered 2021-11-24: 1100 mg via INTRAVENOUS
  Filled 2021-11-24: qty 4

## 2021-11-24 MED ORDER — MAGNESIUM SULFATE 2 GM/50ML IV SOLN
2.0000 g | Freq: Once | INTRAVENOUS | Status: AC
  Administered 2021-11-24: 2 g via INTRAVENOUS

## 2021-11-24 MED ORDER — PALONOSETRON HCL INJECTION 0.25 MG/5ML
0.2500 mg | Freq: Once | INTRAVENOUS | Status: AC
  Administered 2021-11-24: 0.25 mg via INTRAVENOUS

## 2021-11-24 MED ORDER — SODIUM CHLORIDE 0.9% FLUSH
10.0000 mL | INTRAVENOUS | Status: DC | PRN
  Administered 2021-11-24: 10 mL

## 2021-11-24 MED ORDER — SODIUM CHLORIDE 0.9 % IV SOLN: Freq: Once | INTRAVENOUS | Status: AC

## 2021-11-24 NOTE — Progress Notes (Signed)
Patient has been examined by Dr. Katragadda, and vital signs and labs have been reviewed. ANC, Creatinine, LFTs, hemoglobin, and platelets are within treatment parameters per M.D. - pt may proceed with treatment.  Primary RN and pharmacy notified.  

## 2021-11-24 NOTE — Progress Notes (Signed)
Patients port flushed without difficulty.  Good blood return noted with no bruising or swelling noted at site.  Patient remains accessed for chemotherapy treatment.  

## 2021-11-24 NOTE — Patient Instructions (Addendum)
[place] at [facility] Discharge Instructions   You were seen and examined today by Dr. Delton Coombes.  He reviewed the results of your CT scan which shows your cancer has shrunk and that treatment is working.   He reviewed the results of you lab work which are normal/stable.   We will proceed with your treatment today.  Return as scheduled.    Thank you for choosing [place] at [facility] to provide your oncology and hematology care.  To afford each patient quality time with our provider, please arrive at least 15 minutes before your scheduled appointment time.   If you have a lab appointment with the [facility] please come in thru the Main Entrance and check in at the main information desk.  You need to re-schedule your appointment should you arrive 10 or more minutes late.  We strive to give you quality time with our providers, and arriving late affects you and other patients whose appointments are after yours.  Also, if you no show three or more times for appointments you may be dismissed from the clinic at the providers discretion.     Again, thank you for choosing [facility].  Our hope is that these requests will decrease the amount of time that you wait before being seen by our physicians.       _____________________________________________________________  Should you have questions after your visit to [facility], please contact our office at [phone] and follow the prompts.  Our office hours are 8:00 a.m. and 4:30 p.m. Monday - Friday.  Please note that voicemails left after 4:00 p.m. may not be returned until the following business day.  We are closed weekends and major holidays.  You do have access to a nurse 24-7, just call the main number to the clinic [phone] and do not press any options, hold on the line and a nurse will answer the phone.    For prescription refill requests, have your  pharmacy contact our office and allow 72 hours.    Due to Covid, you will need to wear a mask upon entering the hospital. If you do not have a mask, a mask will be given to you at the Main Entrance upon arrival. For doctor visits, patients may have 1 support person age 28 or older with them. For treatment visits, patients can not have anyone with them due to social distancing guidelines and our immunocompromised population.

## 2021-11-24 NOTE — Patient Instructions (Signed)
[facility]  Discharge Instructions: Thank you for choosing [facility] to provide your oncology and hematology care.  If you have a lab appointment with the [facility], please come in thru the Main Entrance and check in at the main information desk.  Wear comfortable clothing and clothing appropriate for easy access to any Portacath or PICC line.   We strive to give you quality time with your provider. You may need to reschedule your appointment if you arrive late (15 or more minutes).  Arriving late affects you and other patients whose appointments are after yours.  Also, if you miss three or more appointments without notifying the office, you may be dismissed from the clinic at the provider's discretion.      For prescription refill requests, have your pharmacy contact our office and allow 72 hours for refills to be completed.    Today you received the following chemotherapy and/or immunotherapy agents Keytruda/Alimta/Carboplatin.  Pembrolizumab Injection What is this medication? PEMBROLIZUMAB (PEM broe LIZ ue mab) treats some types of cancer. It works by helping your immune system slow or stop the spread of cancer cells. It is a monoclonal antibody. This medicine may be used for other purposes; ask your health care provider or pharmacist if you have questions. COMMON BRAND NAME(S): Keytruda What should I tell my care team before I take this medication? They need to know if you have any of these conditions: Allogeneic stem cell transplant (uses someone else's stem cells) Autoimmune diseases, such as Crohn disease, ulcerative colitis, lupus History of chest radiation Nervous system problems, such as Guillain-Barre syndrome, myasthenia gravis Organ transplant An unusual or allergic reaction to pembrolizumab, other medications, foods, dyes, or preservatives Pregnant or trying to get pregnant Breast-feeding How should I use this medication? This  medication is injected into a vein. It is given by your care team in a hospital or clinic setting. A special MedGuide will be given to you before each treatment. Be sure to read this information carefully each time. Talk to your care team about the use of this medication in children. While it may be prescribed for children as young as 6 months for selected conditions, precautions do apply. Overdosage: If you think you have taken too much of this medicine contact a poison control center or emergency room at once. NOTE: This medicine is only for you. Do not share this medicine with others. What if I miss a dose? Keep appointments for follow-up doses. It is important not to miss your dose. Call your care team if you are unable to keep an appointment. What may interact with this medication? Interactions have not been studied. This list may not describe all possible interactions. Give your health care provider a list of all the medicines, herbs, non-prescription drugs, or dietary supplements you use. Also tell them if you smoke, drink alcohol, or use illegal drugs. Some items may interact with your medicine. What should I watch for while using this medication? Your condition will be monitored carefully while you are receiving this medication. You may need blood work while taking this medication. This medication may cause serious skin reactions. They can happen weeks to months after starting the medication. Contact your care team right away if you notice fevers or flu-like symptoms with a rash. The rash may be red or purple and then turn into blisters or peeling of the skin. You may also notice a red rash with swelling of the face, lips, or lymph  nodes in your neck or under your arms. Tell your care team right away if you have any change in your eyesight. Talk to your care team if you may be pregnant. Serious birth defects can occur if you take this medication during pregnancy and for 4 months after the last  dose. You will need a negative pregnancy test before starting this medication. Contraception is recommended while taking this medication and for 4 months after the last dose. Your care team can help you find the option that works for you. Do not breastfeed while taking this medication and for 4 months after the last dose. What side effects may I notice from receiving this medication? Side effects that you should report to your care team as soon as possible: Allergic reactions--skin rash, itching, hives, swelling of the face, lips, tongue, or throat Dry cough, shortness of breath or trouble breathing Eye pain, redness, irritation, or discharge with blurry or decreased vision Heart muscle inflammation--unusual weakness or fatigue, shortness of breath, chest pain, fast or irregular heartbeat, dizziness, swelling of the ankles, feet, or hands Hormone gland problems--headache, sensitivity to light, unusual weakness or fatigue, dizziness, fast or irregular heartbeat, increased sensitivity to cold or heat, excessive sweating, constipation, hair loss, increased thirst or amount of urine, tremors or shaking, irritability Infusion reactions--chest pain, shortness of breath or trouble breathing, feeling faint or lightheaded Kidney injury (glomerulonephritis)--decrease in the amount of urine, red or dark Denise [name] urine, foamy or bubbly urine, swelling of the ankles, hands, or feet Liver injury--right upper belly pain, loss of appetite, nausea, light-colored stool, dark yellow or Denise [name] urine, yellowing skin or eyes, unusual weakness or fatigue Pain, tingling, or numbness in the hands or feet, muscle weakness, change in vision, confusion or trouble speaking, loss of balance or coordination, trouble walking, seizures Rash, fever, and swollen lymph nodes Redness, blistering, peeling, or loosening of the skin, including inside the mouth Sudden or severe stomach pain, bloody diarrhea, fever, nausea, vomiting Side effects  that usually do not require medical attention (report to your care team if they continue or are bothersome): Bone, joint, or muscle pain Diarrhea Fatigue Loss of appetite Nausea Skin rash This list may not describe all possible side effects. Call your doctor for medical advice about side effects. You may report side effects to FDA at 1-800-FDA-1088. Where should I keep my medication? This medication is given in a hospital or clinic. It will not be stored at home. NOTE: This sheet is a summary. It may not cover all possible information. If you have questions about this medicine, talk to your doctor, pharmacist, or health care provider.  2023 Elsevier/Gold Standard (2021-06-27 00:00:00)   Pemetrexed Injection What is this medication? PEMETREXED (PEM e TREX ed) treats some types of cancer. It works by slowing down the growth of cancer cells. This medicine may be used for other purposes; ask your health care provider or pharmacist if you have questions. COMMON BRAND NAME(S): Alimta, PEMFEXY What should I tell my care team before I take this medication? They need to know if you have any of these conditions: Infection, such as chickenpox, cold sores, or herpes Kidney disease Low blood cell levels (white cells, red cells, and platelets) Lung or breathing disease, such as asthma Radiation therapy An unusual or allergic reaction to pemetrexed, other medications, foods, dyes, or preservatives If you or your partner are pregnant or trying to get pregnant Breast-feeding How should I use this medication? This medication is injected into a vein. It is  given by your care team in a hospital or clinic setting. Talk to your care team about the use of this medication in children. Special care may be needed. Overdosage: If you think you have taken too much of this medicine contact a poison control center or emergency room at once. NOTE: This medicine is only for you. Do not share this medicine with  others. What if I miss a dose? Keep appointments for follow-up doses. It is important not to miss your dose. Call your care team if you are unable to keep an appointment. What may interact with this medication? Do not take this medication with any of the following: Live virus vaccines This medication may also interact with the following: Ibuprofen This list may not describe all possible interactions. Give your health care provider a list of all the medicines, herbs, non-prescription drugs, or dietary supplements you use. Also tell them if you smoke, drink alcohol, or use illegal drugs. Some items may interact with your medicine. What should I watch for while using this medication? Your condition will be monitored carefully while you are receiving this medication. This medication may make you feel generally unwell. This is not uncommon as chemotherapy can affect healthy cells as well as cancer cells. Report any side effects. Continue your course of treatment even though you feel ill unless your care team tells you to stop. This medication can cause serious side effects. To reduce the risk, your care team may give you other medications to take before receiving this one. Be sure to follow the directions from your care team. This medication can cause a rash or redness in areas of the body that have previously had radiation therapy. If you have had radiation therapy, tell your care team if you notice a rash in this area. This medication may increase your risk of getting an infection. Call your care team for advice if you get a fever, chills, sore throat, or other symptoms of a cold or flu. Do not treat yourself. Try to avoid being around people who are sick. Be careful brushing or flossing your teeth or using a toothpick because you may get an infection or bleed more easily. If you have any dental work done, tell your dentist you are receiving this medication. Avoid taking medications that contain  aspirin, acetaminophen, ibuprofen, naproxen, or ketoprofen unless instructed by your care team. These medications may hide a fever. Check with your care team if you have severe diarrhea, nausea, and vomiting, or if you sweat a lot. The loss of too much body fluid may make it dangerous for you to take this medication. Talk to your care team if you or your partner wish to become pregnant or think either of you might be pregnant. This medication can cause serious birth defects if taken during pregnancy and for 6 months after the last dose. A negative pregnancy test is required before starting this medication. A reliable form of contraception is recommended while taking this medication and for 6 months after the last dose. Talk to your care team about reliable forms of contraception. Do not father a child while taking this medication and for 3 months after the last dose. Use a condom while having sex during this time period. Do not breastfeed while taking this medication and for 1 week after the last dose. This medication may cause infertility. Talk to your care team if you are concerned about your fertility. What side effects may I notice from receiving this medication? Side  effects that you should report to your care team as soon as possible: Allergic reactions--skin rash, itching, hives, swelling of the face, lips, tongue, or throat Dry cough, shortness of breath or trouble breathing Infection--fever, chills, cough, sore throat, wounds that don't heal, pain or trouble when passing urine, general feeling of discomfort or being unwell Kidney injury--decrease in the amount of urine, swelling of the ankles, hands, or feet Low red blood cell level--unusual weakness or fatigue, dizziness, headache, trouble breathing Redness, blistering, peeling, or loosening of the skin, including inside the mouth Unusual bruising or bleeding Side effects that usually do not require medical attention (report to your care team  if they continue or are bothersome): Fatigue Loss of appetite Nausea Vomiting This list may not describe all possible side effects. Call your doctor for medical advice about side effects. You may report side effects to FDA at 1-800-FDA-1088. Where should I keep my medication? This medication is given in a hospital or clinic. It will not be stored at home. NOTE: This sheet is a summary. It may not cover all possible information. If you have questions about this medicine, talk to your doctor, pharmacist, or health care provider.  2023 Elsevier/Gold Standard (2021-07-11 00:00:00)   Carboplatin Injection What is this medication? CARBOPLATIN (KAR boe pla tin) treats some types of cancer. It works by slowing down the growth of cancer cells. This medicine may be used for other purposes; ask your health care provider or pharmacist if you have questions. COMMON BRAND NAME(S): Paraplatin What should I tell my care team before I take this medication? They need to know if you have any of these conditions: Blood disorders Hearing problems Kidney disease Recent or ongoing radiation therapy An unusual or allergic reaction to carboplatin, cisplatin, other medications, foods, dyes, or preservatives Pregnant or trying to get pregnant Breast-feeding How should I use this medication? This medication is injected into a vein. It is given by your care team in a hospital or clinic setting. Talk to your care team about the use of this medication in children. Special care may be needed. Overdosage: If you think you have taken too much of this medicine contact a poison control center or emergency room at once. NOTE: This medicine is only for you. Do not share this medicine with others. What if I miss a dose? Keep appointments for follow-up doses. It is important not to miss your dose. Call your care team if you are unable to keep an appointment. What may interact with this medication? Medications for  seizures Some antibiotics, such as amikacin, gentamicin, neomycin, streptomycin, tobramycin Vaccines This list may not describe all possible interactions. Give your health care provider a list of all the medicines, herbs, non-prescription drugs, or dietary supplements you use. Also tell them if you smoke, drink alcohol, or use illegal drugs. Some items may interact with your medicine. What should I watch for while using this medication? Your condition will be monitored carefully while you are receiving this medication. You may need blood work while taking this medication. This medication may make you feel generally unwell. This is not uncommon, as chemotherapy can affect healthy cells as well as cancer cells. Report any side effects. Continue your course of treatment even though you feel ill unless your care team tells you to stop. In some cases, you may be given additional medications to help with side effects. Follow all directions for their use. This medication may increase your risk of getting an infection. Call your care  team for advice if you get a fever, chills, sore throat, or other symptoms of a cold or flu. Do not treat yourself. Try to avoid being around people who are sick. Avoid taking medications that contain aspirin, acetaminophen, ibuprofen, naproxen, or ketoprofen unless instructed by your care team. These medications may hide a fever. Be careful brushing or flossing your teeth or using a toothpick because you may get an infection or bleed more easily. If you have any dental work done, tell your dentist you are receiving this medication. Talk to your care team if you wish to become pregnant or think you might be pregnant. This medication can cause serious birth defects. Talk to your care team about effective forms of contraception. Do not breast-feed while taking this medication. What side effects may I notice from receiving this medication? Side effects that you should report to your  care team as soon as possible: Allergic reactions--skin rash, itching, hives, swelling of the face, lips, tongue, or throat Infection--fever, chills, cough, sore throat, wounds that don't heal, pain or trouble when passing urine, general feeling of discomfort or being unwell Low red blood cell level--unusual weakness or fatigue, dizziness, headache, trouble breathing Pain, tingling, or numbness in the hands or feet, muscle weakness, change in vision, confusion or trouble speaking, loss of balance or coordination, trouble walking, seizures Unusual bruising or bleeding Side effects that usually do not require medical attention (report to your care team if they continue or are bothersome): Hair loss Nausea Unusual weakness or fatigue Vomiting This list may not describe all possible side effects. Call your doctor for medical advice about side effects. You may report side effects to FDA at 1-800-FDA-1088. Where should I keep my medication? This medication is given in a hospital or clinic. It will not be stored at home. NOTE: This sheet is a summary. It may not cover all possible information. If you have questions about this medicine, talk to your doctor, pharmacist, or health care provider.  2023 Elsevier/Gold Standard (2021-06-28 00:00:00)        To help prevent nausea and vomiting after your treatment, we encourage you to take your nausea medication as directed.  BELOW ARE SYMPTOMS THAT SHOULD BE REPORTED IMMEDIATELY: *FEVER GREATER THAN 100.4 F (38 C) OR HIGHER *CHILLS OR SWEATING *NAUSEA AND VOMITING THAT IS NOT CONTROLLED WITH YOUR NAUSEA MEDICATION *UNUSUAL SHORTNESS OF BREATH *UNUSUAL BRUISING OR BLEEDING *URINARY PROBLEMS (pain or burning when urinating, or frequent urination) *BOWEL PROBLEMS (unusual diarrhea, constipation, pain near the anus) TENDERNESS IN MOUTH AND THROAT WITH OR WITHOUT PRESENCE OF ULCERS (sore throat, sores in mouth, or a toothache) UNUSUAL RASH, SWELLING OR  PAIN  UNUSUAL VAGINAL DISCHARGE OR ITCHING   Items with * indicate a potential emergency and should be followed up as soon as possible or go to the Emergency Department if any problems should occur.  Please show the CHEMOTHERAPY ALERT CARD or IMMUNOTHERAPY ALERT CARD at check-in to the Emergency Department and triage nurse.  Should you have questions after your visit or need to cancel or reschedule your appointment, please contact [facility] [phone]  and follow the prompts.  Office hours are 8:00 a.m. to 4:30 p.m. Monday - Friday. Please note that voicemails left after 4:00 p.m. may not be returned until the following business day.  We are closed weekends and major holidays. You have access to a nurse at all times for urgent questions. Please call the main number to the clinic [phone] and follow  the prompts.  For any non-urgent questions, you may also contact your provider using MyChart. We now offer e-Visits for anyone 40 and older to request care online for non-urgent symptoms. For details visit [email or web address].   Also download the MyChart app! Go to the app store, search "MyChart", open the app, select [facility], and log in with your MyChart username and password.  Masks are optional in the cancer centers. If you would like for your care team to wear a mask while they are taking care of you, please let them know. You may have one support person who is at least 70 years old accompany you for your appointments.

## 2021-11-24 NOTE — Progress Notes (Signed)
Patient presents today for chemotherapy treatment.  Patient is in satisfactory condition with no complaints voiced. Vital signs are stable.  Labs reviewed by Dr. Delton Coombes during her office visit.  All labs are within treatment parameters.  We will proceed with treatment per MD orders.   Patient tolerated treatment well with no complaints voiced.  Patient left via wheelchair in stable condition.  Vital signs stable at discharge.  Follow up as scheduled.

## 2021-11-25 ENCOUNTER — Inpatient Hospital Stay: Payer: Medicare HMO

## 2021-11-25 ENCOUNTER — Encounter: Payer: Self-pay | Admitting: Hematology

## 2021-11-25 VITALS — BP 97/78 | HR 100 | Temp 97.1°F | Resp 18

## 2021-11-25 DIAGNOSIS — Z5111 Encounter for antineoplastic chemotherapy: Secondary | ICD-10-CM | POA: Diagnosis not present

## 2021-11-25 DIAGNOSIS — Z79899 Other long term (current) drug therapy: Secondary | ICD-10-CM | POA: Diagnosis not present

## 2021-11-25 DIAGNOSIS — Z5112 Encounter for antineoplastic immunotherapy: Secondary | ICD-10-CM | POA: Diagnosis not present

## 2021-11-25 DIAGNOSIS — I2699 Other pulmonary embolism without acute cor pulmonale: Secondary | ICD-10-CM | POA: Diagnosis not present

## 2021-11-25 DIAGNOSIS — Z7901 Long term (current) use of anticoagulants: Secondary | ICD-10-CM | POA: Diagnosis not present

## 2021-11-25 DIAGNOSIS — C3492 Malignant neoplasm of unspecified part of left bronchus or lung: Secondary | ICD-10-CM | POA: Diagnosis not present

## 2021-11-25 DIAGNOSIS — Z5189 Encounter for other specified aftercare: Secondary | ICD-10-CM | POA: Diagnosis not present

## 2021-11-25 DIAGNOSIS — C787 Secondary malignant neoplasm of liver and intrahepatic bile duct: Secondary | ICD-10-CM | POA: Diagnosis not present

## 2021-11-25 DIAGNOSIS — D329 Benign neoplasm of meninges, unspecified: Secondary | ICD-10-CM | POA: Diagnosis not present

## 2021-11-25 MED ORDER — PEGFILGRASTIM-CBQV 6 MG/0.6ML ~~LOC~~ SOSY
6.0000 mg | PREFILLED_SYRINGE | Freq: Once | SUBCUTANEOUS | Status: AC
  Administered 2021-11-25: 6 mg via SUBCUTANEOUS
  Filled 2021-11-25: qty 0.6

## 2021-11-25 NOTE — Patient Instructions (Signed)
[facility]  Discharge Instructions: Thank you for choosing [facility] to provide your oncology and hematology care.  If you have a lab appointment with the [facility], please come in thru the Main Entrance and check in at the main information desk.  Wear comfortable clothing and clothing appropriate for easy access to any Portacath or PICC line.   We strive to give you quality time with your provider. You may need to reschedule your appointment if you arrive late (15 or more minutes).  Arriving late affects you and other patients whose appointments are after yours.  Also, if you miss three or more appointments without notifying the office, you may be dismissed from the clinic at the provider's discretion.      For prescription refill requests, have your pharmacy contact our office and allow 72 hours for refills to be completed.    Today you received the following Udenyca injection, return as scheduled.   To help prevent nausea and vomiting after your treatment, we encourage you to take your nausea medication as directed.  BELOW ARE SYMPTOMS THAT SHOULD BE REPORTED IMMEDIATELY: *FEVER GREATER THAN 100.4 F (38 C) OR HIGHER *CHILLS OR SWEATING *NAUSEA AND VOMITING THAT IS NOT CONTROLLED WITH YOUR NAUSEA MEDICATION *UNUSUAL SHORTNESS OF BREATH *UNUSUAL BRUISING OR BLEEDING *URINARY PROBLEMS (pain or burning when urinating, or frequent urination) *BOWEL PROBLEMS (unusual diarrhea, constipation, pain near the anus) TENDERNESS IN MOUTH AND THROAT WITH OR WITHOUT PRESENCE OF ULCERS (sore throat, sores in mouth, or a toothache) UNUSUAL RASH, SWELLING OR PAIN  UNUSUAL VAGINAL DISCHARGE OR ITCHING   Items with * indicate a potential emergency and should be followed up as soon as possible or go to the Emergency Department if any problems should occur.  Please show the CHEMOTHERAPY ALERT CARD or IMMUNOTHERAPY ALERT CARD at check-in to the Emergency Department  and triage nurse.  Should you have questions after your visit or need to cancel or reschedule your appointment, please contact [facility] [phone]  and follow the prompts.  Office hours are 8:00 a.m. to 4:30 p.m. Monday - Friday. Please note that voicemails left after 4:00 p.m. may not be returned until the following business day.  We are closed weekends and major holidays. You have access to a nurse at all times for urgent questions. Please call the main number to the clinic [phone] and follow the prompts.  For any non-urgent questions, you may also contact your provider using MyChart. We now offer e-Visits for anyone 42 and older to request care online for non-urgent symptoms. For details visit [email or web address].   Also download the MyChart app! Go to the app store, search "MyChart", open the app, select [facility], and log in with your MyChart username and password.  Masks are optional in the cancer centers. If you would like for your care team to wear a mask while they are taking care of you, please let them know. You may have one support person who is at least 70 years old accompany you for your appointments.

## 2021-11-25 NOTE — Progress Notes (Signed)
[place], [place] [address]   CLINIC:  Medical Oncology/Hematology  PCP:  Iona Beard, [address] / [place] [address] [phone]   REASON FOR VISIT:  Follow-up for stage IV adenocarcinoma of the lung, negative for targetable mutations  PRIOR THERAPY:  1. Chemoradiation with carboplatin and paclitaxel from 01/07/2019 to 02/11/2019. 2. Consolidation with durvalumab from 03/19/2019 to 04/16/2019, held due to pneumonitis.  NGS Results: not done  CURRENT THERAPY: Consolidation with durvalumab every 2 weeks; brain mets SRS in 5 fractions  BRIEF ONCOLOGIC HISTORY:  Oncology History  Adenocarcinoma of left lung ([place])  12/09/2018 Initial Diagnosis   Adenocarcinoma of left lung ([place])   01/02/2019 Cancer Staging   Staging form: Lung, AJCC 8th Edition - Clinical stage from 01/02/2019: Stage IIIB (cT3, cN2, cM0) - Signed by Derek Jack, MD on 01/02/2019   01/07/2019 - 02/11/2019 Chemotherapy   The patient had palonosetron (ALOXI) injection 0.25 mg, 0.25 mg, Intravenous,  Once, 6 of 6 cycles Administration: 0.25 mg (01/07/2019), 0.25 mg (01/14/2019), 0.25 mg (01/21/2019), 0.25 mg (01/28/2019), 0.25 mg (02/04/2019), 0.25 mg (02/11/2019) CARBOplatin (PARAPLATIN) 270 mg in sodium chloride 0.9 % 250 mL chemo infusion, 270 mg (100 % of original dose 266.4 mg), Intravenous,  Once, 6 of 6 cycles Dose modification:   (original dose 266.4 mg, Cycle 1),   (original dose 266.4 mg, Cycle 2),   (original dose 266.4 mg, Cycle 3),   (original dose 266.4 mg, Cycle 4) Administration: 270 mg (01/07/2019), 270 mg (01/14/2019), 270 mg (01/21/2019), 270 mg (01/28/2019), 270 mg (02/04/2019), 270 mg (02/11/2019) PACLitaxel (TAXOL) 108 mg in sodium chloride 0.9 % 250 mL chemo infusion (</= 37m/m2), 45 mg/m2 = 108 mg, Intravenous,  Once, 6 of 6 cycles Administration: 108 mg (01/07/2019), 108 mg (01/14/2019), 108 mg (01/21/2019), 108 mg (01/28/2019), 108  mg (02/04/2019), 108 mg (02/11/2019) fosaprepitant (EMEND) 150 mg, dexamethasone (DECADRON) 12 mg in sodium chloride 0.9 % 145 mL IVPB, , Intravenous,  Once, 5 of 5 cycles Administration:  (01/14/2019),  (01/21/2019),  (01/28/2019),  (02/04/2019),  (02/11/2019)  for chemotherapy treatment.    03/19/2019 - 07/23/2020 Chemotherapy   Patient is on Treatment Plan : LUNG DURVALUMAB [id number]    09/22/2021 - 11/04/2021 Chemotherapy   Patient is on Treatment Plan : LUNG Carboplatin (5) + Pemetrexed (500) + Pembrolizumab (200) D1 q21d Induction x 4 cycles / Maintenance Pemetrexed (500) + Pembrolizumab (200) D1 q21d     09/22/2021 -  Chemotherapy   Patient is on Treatment Plan : LUNG Carboplatin (5) + Pemetrexed (500) + Pembrolizumab (200) D1 q21d Induction x 4 cycles / Maintenance Pemetrexed (500) + Pembrolizumab (200) D1 q21d       CANCER STAGING:  Cancer Staging  Adenocarcinoma of left lung ([facility] Staging form: Lung, AJCC 8th Edition - Clinical stage from 01/02/2019: Stage IIIB (cT3, cN2, cM0) - Signed by KDerek Jack MD on 01/02/2019 - Pathologic stage from 09/07/2021: Stage IVA (pTX, pNX, pM1b) - Unsigned   INTERVAL HISTORY:  Ms. AChevi Lim a 71y.o. female, seen for follow-up of metastatic lung cancer to the liver.  She has completed 3 cycles of chemotherapy.  She had a CT CAP done on 11/10/2021 to evaluate response.  She did not have any major GI side effects after last chemotherapy.  She reported right arm pain, cramp-like for the last 1 week.  After last cycle of chemotherapy, she had nausea and dizziness lasted about a week.  This has subsided completely.  She lost about 5 pounds since last treatment on 11/03/2021.   REVIEW OF SYSTEMS:  Review of Systems  Musculoskeletal:  Positive for arthralgias (7/10 R knee).  All other systems reviewed and are negative.   PAST MEDICAL/SURGICAL HISTORY:  Past Medical History:  Diagnosis Date   Anemia    Aortic stenosis    Arthritis    Coronary  artery calcification seen on CT scan    Essential hypertension    GERD (gastroesophageal reflux disease)    History of lung cancer    Stage III adenocarcinoma status post chemoradiation   Port-A-Cath in place 01/06/2019   Type 2 diabetes mellitus ([facility])    Past Surgical History:  Procedure Laterality Date   CHOLECYSTECTOMY  1997   COLONOSCOPY     GASTRIC BYPASS     INCISIONAL HERNIA REPAIR  04/11/11   IR IMAGING GUIDED PORT INSERTION  12/27/2018   Right   IR US GUIDE BX ASP/DRAIN  08/08/2021   LAPAROSCOPIC SALPINGOOPHERECTOMY     LAPAROTOMY  04/11/2011   Procedure: EXPLORATORY LAPAROTOMY;  Surgeon: Joyice Faster. Cornett, MD;  Location: [facility];  Service: General;  Laterality: N/A;  closure port hole   RIGHT/LEFT HEART CATH AND CORONARY ANGIOGRAPHY N/A 12/29/2019   Procedure: RIGHT/LEFT HEART CATH AND CORONARY ANGIOGRAPHY;  Surgeon: Troy Sine, MD;  Location: [facility];  Service: Cardiovascular;  Laterality: N/A;   TOTAL HIP ARTHROPLASTY  03/07/2012   Procedure: TOTAL HIP ARTHROPLASTY ANTERIOR APPROACH;  Surgeon: Mauri Pole, MD;  Location: [facility];  Service: Orthopedics;  Laterality: Right;   TOTAL SHOULDER ARTHROPLASTY Left 01/21/2015   TOTAL SHOULDER ARTHROPLASTY Left 01/21/2015   Procedure: LEFT TOTAL SHOULDER ARTHROPLASTY;  Surgeon: Justice Britain, MD;  Location: [facility];  Service: Orthopedics;  Laterality: Left;   VAGINAL HYSTERECTOMY      SOCIAL HISTORY:  Social History   Socioeconomic History   Marital status: Single    Spouse name: Not on file   Number of children: Not on file   Years of education: 12th grade   Highest education level: Not on file  Occupational History   Occupation: Employed    Employer: [employer]  Tobacco Use   Smoking status: Former    Packs/day: 1.00    Years: 12.00    Total pack years: 12.00    Types: Cigarettes    Quit date: 03/20/1976    Years since quitting: 45.7   Smokeless tobacco: Never  Vaping Use   Vaping Use: Never  used  Substance and Sexual Activity   Alcohol use: No   Drug use: No   Sexual activity: Not Currently  Other Topics Concern   Not on file  Social History Narrative   Not on file   Social Determinants of Health   Financial Resource Strain: Low Risk  (12/06/2018)   Overall Financial Resource Strain (CARDIA)    Difficulty of Paying Living Expenses: Not very hard  Food Insecurity: No Food Insecurity (12/06/2018)   Hunger Vital Sign    Worried About Running Out of Food in the Last Year: Never true    Ran Out of Food in the Last Year: Never true  Transportation Needs: No Transportation Needs (12/06/2018)   PRAPARE - Hydrologist (Medical): No    Lack of Transportation (Non-Medical): No  Physical Activity: Inactive (12/06/2018)   Exercise Vital Sign    Days of Exercise per Week: 0 days  Minutes of Exercise per Session: 0 min  Stress: No Stress Concern Present (12/06/2018)   [place]    Feeling of Stress : Only a little  Social Connections: Moderately Isolated (12/06/2018)   Social Connection and Isolation Panel [NHANES]    Frequency of Communication with Friends and Family: More than three times a week    Frequency of Social Gatherings with Friends and Family: Once a week    Attends Religious Services: More than 4 times per year    Active Member of [employer] or Organizations: No    Attends Archivist Meetings: Never    Marital Status: Never married  Intimate Partner Violence: Not At Risk (12/06/2018)   Humiliation, Afraid, Rape, and Kick questionnaire    Fear of Current or Ex-Partner: No    Emotionally Abused: No    Physically Abused: No    Sexually Abused: No    FAMILY HISTORY:  Family History  Problem Relation Age of Onset   Breast cancer Mother    COPD Mother    Arthritis Mother    Diabetes Mother    Hypertension Mother    Hypertension Father    Diabetes Father     Breast cancer Sister    Thyroid cancer Brother    Huntington's disease Maternal Grandmother    Heart attack Brother     CURRENT MEDICATIONS:  Current Outpatient Medications  Medication Sig Dispense Refill   albuterol (VENTOLIN HFA) 108 (90 Base) MCG/ACT inhaler TAKE 2 PUFFS BY MOUTH EVERY 6 HOURS AS NEEDED FOR WHEEZE OR SHORTNESS OF BREATH (Patient taking differently: Inhale 2 puffs into the lungs every 6 (six) hours as needed for wheezing or shortness of breath.) 18 each 6   apixaban (ELIQUIS) 5 MG TABS tablet Take 1 tablet (5 mg total) by mouth 2 (two) times daily. 60 tablet 6   benzonatate (TESSALON) 100 MG capsule Take 1 capsule (100 mg total) by mouth 3 (three) times daily as needed for cough. 21 capsule 0   Calcium Carbonate Antacid (TUMS PO) Take 4 tablets by mouth daily.     carvedilol (COREG) 3.125 MG tablet Take 1 tablet (3.125 mg total) by mouth 2 (two) times daily. 180 tablet 1   Cyanocobalamin (VITAMIN B-12) 2500 MCG SUBL Place 2,500 mcg under the tongue every morning.      diphenhydrAMINE (BENADRYL) 25 MG tablet Take 25 mg by mouth every 6 (six) hours as needed for itching or allergies.     ferrous sulfate 325 (65 FE) MG tablet Take 325 mg by mouth every evening.     fluticasone (FLONASE) 50 MCG/ACT nasal spray Place 1 spray into both nostrils 2 (two) times daily. 16 g 2   folic acid (FOLVITE) 1 MG tablet TAKE 1 TABLET BY MOUTH EVERY DAY 90 tablet 1   furosemide (LASIX) 20 MG tablet Take 20 mg by mouth as needed.     gabapentin (NEURONTIN) 300 MG capsule Take 300 mg by mouth at bedtime as needed (Nerve Pain).     Multiple Vitamin (MULITIVITAMIN WITH MINERALS) TABS Take 1 tablet by mouth daily.     omeprazole (PRILOSEC) 20 MG capsule Take 20 mg by mouth daily.     Potassium Chloride ER 20 MEQ TBCR Take 1 tablet by mouth daily.     prochlorperazine (COMPAZINE) 10 MG tablet Take 1 tablet (10 mg total) by mouth every 6 (six) hours as needed for nausea or vomiting. 30 tablet 3   No  current facility-administered medications for this visit.   Facility-Administered Medications Ordered in Other Visits  Medication Dose Route Frequency Provider Last Rate Last Admin   magnesium sulfate 2 GM/50ML IVPB             ALLERGIES:  No Known Allergies  PHYSICAL EXAM:  Performance status (ECOG): 1 - Symptomatic but completely ambulatory  Vitals:   11/24/21 1000  BP: 125/81  Pulse: 78  Resp: 16  Temp: 98.6 F (37 C)  SpO2: 94%   Wt Readings from Last 3 Encounters:  11/24/21 241 lb 13.5 oz (109.7 kg)  11/03/21 246 lb 7.6 oz (111.8 kg)  10/27/21 244 lb 12.8 oz (111 kg)   Physical Exam Vitals reviewed.  Constitutional:      Appearance: Normal appearance.     Comments: In wheelchair  Cardiovascular:     Rate and Rhythm: Normal rate and regular rhythm.     Pulses: Normal pulses.     Heart sounds: Murmur heard.     Systolic murmur is present.  Pulmonary:     Effort: Pulmonary effort is normal.     Breath sounds: Normal breath sounds.  Musculoskeletal:     Right lower leg: No edema.     Left lower leg: No edema.  Neurological:     General: No focal deficit present.     Mental Status: She is alert and oriented to person, place, and time.  Psychiatric:        Mood and Affect: Mood normal.        Behavior: Behavior normal.     LABORATORY DATA:  I have reviewed the labs as listed.     Latest Ref Rng & Units 11/24/2021    9:05 AM 11/03/2021   10:18 AM 10/13/2021    9:06 AM  CBC  WBC 4.0 - 10.5 K/uL 5.4  2.5  3.9   Hemoglobin 12.0 - 15.0 g/dL 10.4  10.8  11.4   Hematocrit 36.0 - 46.0 % 32.3  33.6  36.0   Platelets 150 - 400 K/uL 237  225  242       Latest Ref Rng & Units 11/24/2021    9:05 AM 11/03/2021   10:18 AM 10/13/2021    9:06 AM  CMP  Glucose 70 - 99 mg/dL 99  103  98   BUN 8 - 23 mg/dL '12  9  8   ' Creatinine 0.44 - 1.00 mg/dL 0.84  0.75  0.85   Sodium 135 - 145 mmol/L 141  142  141   Potassium 3.5 - 5.1 mmol/L 3.4  3.4  3.9   Chloride 98 - 111 mmol/L  109  110  110   CO2 22 - 32 mmol/L '27  26  26   ' Calcium 8.9 - 10.3 mg/dL 8.5  8.7  8.6   Total Protein 6.5 - 8.1 g/dL 6.3  6.3  6.1   Total Bilirubin 0.3 - 1.2 mg/dL 0.5  0.3  0.5   Alkaline Phos 38 - 126 U/L 74  74  83   AST 15 - 41 U/L '22  19  16   ' ALT 0 - 44 U/L '25  20  19     ' DIAGNOSTIC IMAGING:  I have independently reviewed the scans and discussed with the patient. CT CHEST ABDOMEN PELVIS W CONTRAST  Result Date: 11/11/2021 CLINICAL DATA:  Metastatic non-small cell lung cancer restaging. Prior chemotherapy. * Tracking Code: BO * EXAM: CT CHEST, ABDOMEN, AND PELVIS WITH CONTRAST TECHNIQUE: Multidetector  CT imaging of the chest, abdomen and pelvis was performed following the standard protocol during bolus administration of intravenous contrast. RADIATION DOSE REDUCTION: This exam was performed according to the departmental dose-optimization program which includes automated exposure control, adjustment of the mA and/or kV according to patient size and/or use of iterative reconstruction technique. CONTRAST:  149m OMNIPAQUE IOHEXOL 300 MG/ML  SOLN COMPARISON:  Multiple exams, including 08/25/2021 PET-CT FINDINGS: CT CHEST FINDINGS Cardiovascular: Right Port-A-Cath tip: Lower SVC. Atherosclerotic calcification of the aortic arch. Mitral and aortic valve calcifications. Trace anterior pericardial effusion. Mediastinum/Nodes: Unremarkable Lungs/Pleura: Stable volume loss and therapy related findings primarily in the left lower lobe in the perihilar region. The previous right apical nodule has resolved. Previous faintly metabolic ground-glass opacity peripherally in the right upper lobe has resolved. New peripheral airspace opacity in the right upper lobe on image 62 series 4, 2.0 by 1.3 cm. Subtle ground-glass density along its margin. Musculoskeletal: Severe degenerative right glenohumeral arthropathy. Left shoulder joint prosthesis. Thoracic spondylosis. CT ABDOMEN PELVIS FINDINGS Hepatobiliary:  Bilobed right hepatic lobe mass with upper lobulation 1.7 by 2.0 cm on image 55 series 2 (formerly 3.7 by 3.7 cm on 08/05/2021) and lower portion somewhat indistinct but about 3.1 by 1.8 cm on image 59 series 2 (formerly 4.4 by 2.5 cm). Today's measurements are obtained by CT and could have some modality based variation in measurement could compared to the prior MRI. Suspected hepatic cysts posteriorly in the left hepatic lobe on image 52 series 2, 1.0 cm in diameter. Mild intrahepatic biliary dilatation. Common bile duct dilated at 1.6 cm in diameter, similar to previous. Pancreas: Mildly dilated dorsal pancreatic duct. This is similar to the prior exam. Spleen: Unremarkable Adrenals/Urinary Tract: Both adrenal glands appear normal. Mild scarring in the right kidney posteriorly. Small hypodense renal lesions on the left are technically too small to characterize although statistically highly likely to represent cysts, these do not merit individual follow up. Stomach/Bowel: Gastric bypass. Broad anterior abdominal wall umbilical hernia containing adipose tissue in loops of small bowel, without strangulation or obstruction. Descending and proximal sigmoid colon diverticulosis without evidence of active diverticulitis. Vascular/Lymphatic: Atherosclerosis is present, including aortoiliac atherosclerotic disease. Reproductive: Large calcified uterine fibroid. Other: No supplemental non-categorized findings. Musculoskeletal: Right total hip prosthesis. Grade 1 degenerative anterolisthesis at L4-5. Lumbar spondylosis and degenerative disc disease cause bilateral foraminal impingement at L3-4 and L4-5, and potentially at L5-S1 as well. Ventral hernia is noted above. IMPRESSION: 1. Reduced size of the bilobed hepatic mass favoring improved metastatic lesion. 2. Interval resolution of previously identified ground-glass density pulmonary nodules. However, there is a new localized airspace opacity measuring 2.0 by 1.3 cm in the  right upper lobe, probably inflammatory, less likely to be malignant (surveillance suggested). 3. Other imaging findings of potential clinical significance: Aortic Atherosclerosis (ICD10-I70.0). Mitral and aortic valve calcifications. Trace anterior pericardial effusion. Chronic intrahepatic and extrahepatic biliary dilatation. Gastric bypass. Colonic diverticulosis. Umbilical hernia containing adipose tissue and bowel, without complicating feature. Multilevel lumbar impingement. Electronically Signed   By: WVan ClinesM.D.   On: 11/11/2021 13:42     ASSESSMENT:  1.  Adenocarcinoma of left lung ([facility]) -Chemoradiation therapy with carboplatin and paclitaxel from 01/07/2019 through 02/11/2019. -Consolidation immunotherapy with durvalumab from 03/19/2019 through 04/15/2018, held due to pneumonitis. -CT chest on 07/02/2019 shows left upper lobe lung mass measuring 2.6 x 2.0 cm.  It shows improvement in size. -MRI of the brain on 07/18/2019 showed left sphenoid wing meningioma measuring 2.6 x 2.0 x 2.6  cm unchanged.  No new enhancing intracranial lesion.  Increased left temporal white matter edema. -Durvalumab restarted on 07/24/2019. -She was evaluated by cardiology with a stress test.  EF was 46%.  She underwent cardiac catheterization which did not show any abnormalities. - 1 year of durvalumab completed on 07/23/2020. - Liver mass biopsy (08/08/2021): Adenocarcinoma, CK7 positive, negative for TTF-1, Napsin a, GATA3, ER, CK20, CDX2 - PET scan (08/25/2021): Right upper lobe lung nodule 9 mm, SUV 5.0.  Groundglass and solid nodule periphery of the right upper lobe 8 mm, SUV 2.45.  Right lobe liver mass 4.5 x 3.6 cm, SUV 7.78. - NGS testing: PD-L1 negative, TMB-low, MSI-stable, K-ras G12 D.  No other targetable mutations. - Carboplatin, pemetrexed and pembrolizumab 4 cycles from 09/22/2021 through 11/25/2021, followed by maintenance pemetrexed and pembrolizumab -  CT CAP (11/10/2021): Reduced size of liver mass  and interval resolution of previously seen groundglass pulmonary nodules.  There is a new inflammatory right upper lobe nodule.  PLAN:  1.  Metastatic adenocarcinoma of the lung to the liver and right lung: - She has completed 3 cycles of chemoimmunotherapy.  She felt nauseous and dizzy for 1 week after last treatment which subsided. - She lost about 5 pounds since last treatment since the start of Lasix daily. - She reported right arm cramp-like pain for the last week.  It is not swollen on physical exam today.  As she is already on anticoagulation full dose, I think it is unlikely DVT. - Reviewed CT CAP (11/10/2021): Reduced size of liver mass and interval resolution of previously seen groundglass pulmonary nodules.  There is a new inflammatory right upper lobe nodule. - She will proceed with last cycle of chemoimmunotherapy today.  She will start maintenance pembrolizumab and pemetrexed in 3 weeks.  I will see her back in 6 weeks for follow-up.   2.  Meningioma: - MRI of the brain on 09/27/2021 with stable meningioma with no new lesions.  3.  Unprovoked pulmonary embolism: - Continue Eliquis 5 mg twice daily indefinitely.  No bleeding issues reported.  4.  Leg swellings: - She has 2+ edema.  Continue Lasix 20 mg daily as needed.   Orders placed this encounter:  No orders of the defined types were placed in this encounter.    Derek Jack, MD [facility] [phone]

## 2021-11-25 NOTE — Progress Notes (Signed)
Patient tolerated injection with no complaints voiced. Site clean and dry with no bruising or swelling noted at site. See MAR for details. Band aid applied.  Patient stable during and after injection. VSS with discharge and left in satisfactory condition with no s/s of distress noted.  

## 2021-11-29 ENCOUNTER — Other Ambulatory Visit: Payer: Self-pay | Admitting: *Deleted

## 2021-12-12 DIAGNOSIS — R0600 Dyspnea, unspecified: Secondary | ICD-10-CM | POA: Diagnosis not present

## 2021-12-12 DIAGNOSIS — I10 Essential (primary) hypertension: Secondary | ICD-10-CM | POA: Diagnosis not present

## 2021-12-12 DIAGNOSIS — R7303 Prediabetes: Secondary | ICD-10-CM | POA: Diagnosis not present

## 2021-12-12 DIAGNOSIS — I2699 Other pulmonary embolism without acute cor pulmonale: Secondary | ICD-10-CM | POA: Diagnosis not present

## 2021-12-12 DIAGNOSIS — D329 Benign neoplasm of meninges, unspecified: Secondary | ICD-10-CM | POA: Diagnosis not present

## 2021-12-12 DIAGNOSIS — R051 Acute cough: Secondary | ICD-10-CM | POA: Diagnosis not present

## 2021-12-12 DIAGNOSIS — M170 Bilateral primary osteoarthritis of knee: Secondary | ICD-10-CM | POA: Diagnosis not present

## 2021-12-12 DIAGNOSIS — C3412 Malignant neoplasm of upper lobe, left bronchus or lung: Secondary | ICD-10-CM | POA: Diagnosis not present

## 2021-12-12 DIAGNOSIS — Z0000 Encounter for general adult medical examination without abnormal findings: Secondary | ICD-10-CM | POA: Diagnosis not present

## 2021-12-15 ENCOUNTER — Inpatient Hospital Stay: Payer: Medicare HMO

## 2021-12-15 VITALS — BP 111/70 | HR 78 | Temp 98.5°F | Resp 19

## 2021-12-15 VITALS — BP 138/82 | HR 75 | Temp 96.8°F | Resp 20 | Wt 242.5 lb

## 2021-12-15 DIAGNOSIS — C3492 Malignant neoplasm of unspecified part of left bronchus or lung: Secondary | ICD-10-CM | POA: Diagnosis not present

## 2021-12-15 DIAGNOSIS — Z5111 Encounter for antineoplastic chemotherapy: Secondary | ICD-10-CM | POA: Diagnosis not present

## 2021-12-15 DIAGNOSIS — Z79899 Other long term (current) drug therapy: Secondary | ICD-10-CM | POA: Diagnosis not present

## 2021-12-15 DIAGNOSIS — R7303 Prediabetes: Secondary | ICD-10-CM

## 2021-12-15 DIAGNOSIS — C787 Secondary malignant neoplasm of liver and intrahepatic bile duct: Secondary | ICD-10-CM

## 2021-12-15 DIAGNOSIS — Z7901 Long term (current) use of anticoagulants: Secondary | ICD-10-CM | POA: Diagnosis not present

## 2021-12-15 DIAGNOSIS — Z5189 Encounter for other specified aftercare: Secondary | ICD-10-CM | POA: Diagnosis not present

## 2021-12-15 DIAGNOSIS — D329 Benign neoplasm of meninges, unspecified: Secondary | ICD-10-CM | POA: Diagnosis not present

## 2021-12-15 DIAGNOSIS — E222 Syndrome of inappropriate secretion of antidiuretic hormone: Secondary | ICD-10-CM

## 2021-12-15 DIAGNOSIS — Z5112 Encounter for antineoplastic immunotherapy: Secondary | ICD-10-CM | POA: Diagnosis not present

## 2021-12-15 DIAGNOSIS — I2699 Other pulmonary embolism without acute cor pulmonale: Secondary | ICD-10-CM | POA: Diagnosis not present

## 2021-12-15 LAB — COMPREHENSIVE METABOLIC PANEL
ALT: 27 U/L (ref 0–44)
AST: 22 U/L (ref 15–41)
Albumin: 3.2 g/dL — ABNORMAL LOW (ref 3.5–5.0)
Alkaline Phosphatase: 85 U/L (ref 38–126)
Anion gap: 10 (ref 5–15)
BUN: 10 mg/dL (ref 8–23)
CO2: 24 mmol/L (ref 22–32)
Calcium: 8.6 mg/dL — ABNORMAL LOW (ref 8.9–10.3)
Chloride: 108 mmol/L (ref 98–111)
Creatinine, Ser: 0.76 mg/dL (ref 0.44–1.00)
GFR, Estimated: >60 mL/min (ref >60)
Glucose, Bld: 115 mg/dL — ABNORMAL HIGH (ref 70–99)
Potassium: 3.2 mmol/L — ABNORMAL LOW (ref 3.5–5.1)
Sodium: 142 mmol/L (ref 135–145)
Total Bilirubin: 0.5 mg/dL (ref 0.3–1.2)
Total Protein: 6 g/dL — ABNORMAL LOW (ref 6.5–8.1)

## 2021-12-15 LAB — CBC WITH DIFFERENTIAL/PLATELET
Abs Immature Granulocytes: 0.02 10*3/uL (ref 0.00–0.07)
Basophils Absolute: 0 10*3/uL (ref 0.0–0.1)
Basophils Relative: 0 %
Eosinophils Absolute: 0 10*3/uL (ref 0.0–0.5)
Eosinophils Relative: 0 %
HCT: 30.4 % — ABNORMAL LOW (ref 36.0–46.0)
Hemoglobin: 9.7 g/dL — ABNORMAL LOW (ref 12.0–15.0)
Immature Granulocytes: 0 %
Lymphocytes Relative: 14 %
Lymphs Abs: 0.8 10*3/uL (ref 0.7–4.0)
MCH: 29 pg (ref 26.0–34.0)
MCHC: 31.9 g/dL (ref 30.0–36.0)
MCV: 91 fL (ref 80.0–100.0)
Monocytes Absolute: 0.4 10*3/uL (ref 0.1–1.0)
Monocytes Relative: 8 %
Neutro Abs: 4 10*3/uL (ref 1.7–7.7)
Neutrophils Relative %: 78 %
Platelets: 210 10*3/uL (ref 150–400)
RBC: 3.34 MIL/uL — ABNORMAL LOW (ref 3.87–5.11)
RDW: 21.5 % — ABNORMAL HIGH (ref 11.5–15.5)
WBC: 5.3 10*3/uL (ref 4.0–10.5)
nRBC: 0.4 % — ABNORMAL HIGH (ref 0.0–0.2)

## 2021-12-15 LAB — MAGNESIUM: Magnesium: 1.5 mg/dL — ABNORMAL LOW (ref 1.7–2.4)

## 2021-12-15 LAB — LIPID PANEL
Cholesterol: 166 mg/dL (ref 0–200)
HDL: 67 mg/dL (ref >40)
LDL Cholesterol: 79 mg/dL (ref 0–99)
Total CHOL/HDL Ratio: 2.5 RATIO
Triglycerides: 100 mg/dL (ref <150)
VLDL: 20 mg/dL (ref 0–40)

## 2021-12-15 LAB — TSH: TSH: 1.953 u[IU]/mL (ref 0.350–4.500)

## 2021-12-15 LAB — HEMOGLOBIN A1C
Hgb A1c MFr Bld: 6.4 % — ABNORMAL HIGH (ref 4.8–5.6)
Mean Plasma Glucose: 136.98 mg/dL

## 2021-12-15 MED ORDER — SODIUM CHLORIDE 0.9 % IV SOLN: 8.0000 mg | Freq: Once | INTRAVENOUS | Status: DC

## 2021-12-15 MED ORDER — SODIUM CHLORIDE 0.9 % IV SOLN
200.0000 mg | Freq: Once | INTRAVENOUS | Status: AC
  Administered 2021-12-15: 200 mg via INTRAVENOUS
  Filled 2021-12-15: qty 8

## 2021-12-15 MED ORDER — POTASSIUM CHLORIDE CRYS ER 20 MEQ PO TBCR
40.0000 meq | EXTENDED_RELEASE_TABLET | Freq: Once | ORAL | Status: AC
  Administered 2021-12-15: 40 meq via ORAL
  Filled 2021-12-15: qty 2

## 2021-12-15 MED ORDER — MAGNESIUM SULFATE 2 GM/50ML IV SOLN
2.0000 g | Freq: Once | INTRAVENOUS | Status: AC
  Administered 2021-12-15: 2 g via INTRAVENOUS
  Filled 2021-12-15: qty 50

## 2021-12-15 MED ORDER — SODIUM CHLORIDE 0.9 % IV SOLN: Freq: Once | INTRAVENOUS | Status: AC

## 2021-12-15 MED ORDER — HEPARIN SOD (PORK) LOCK FLUSH 100 UNIT/ML IV SOLN
500.0000 [IU] | Freq: Once | INTRAVENOUS | Status: AC | PRN
  Administered 2021-12-15: 500 [IU]

## 2021-12-15 MED ORDER — CYANOCOBALAMIN 1000 MCG/ML IJ SOLN: 1000.0000 ug | Freq: Once | INTRAMUSCULAR | Status: DC

## 2021-12-15 MED ORDER — SODIUM CHLORIDE 0.9 % IV SOLN
500.0000 mg/m2 | Freq: Once | INTRAVENOUS | Status: AC
  Administered 2021-12-15: 1100 mg via INTRAVENOUS
  Filled 2021-12-15: qty 40

## 2021-12-15 MED ORDER — SODIUM CHLORIDE 0.9% FLUSH
10.0000 mL | INTRAVENOUS | Status: DC | PRN
  Administered 2021-12-15: 10 mL

## 2021-12-15 MED ORDER — ONDANSETRON HCL 4 MG/2ML IJ SOLN
8.0000 mg | Freq: Once | INTRAMUSCULAR | Status: AC
  Administered 2021-12-15: 8 mg via INTRAVENOUS
  Filled 2021-12-15: qty 4

## 2021-12-15 NOTE — Patient Instructions (Signed)
[facility]  Discharge Instructions: Thank you for choosing [facility] to provide your oncology and hematology care.  If you have a lab appointment with the [facility], please come in thru the Main Entrance and check in at the main information desk.  Wear comfortable clothing and clothing appropriate for easy access to any Portacath or PICC line.   We strive to give you quality time with your provider. You may need to reschedule your appointment if you arrive late (15 or more minutes).  Arriving late affects you and other patients whose appointments are after yours.  Also, if you miss three or more appointments without notifying the office, you may be dismissed from the clinic at the provider's discretion.      For prescription refill requests, have your pharmacy contact our office and allow 72 hours for refills to be completed.    Today you received the following chemotherapy and/or immunotherapy agents Alimta and Keytruda.Pembrolizumab Injection What is this medication? PEMBROLIZUMAB (PEM broe LIZ ue mab) treats some types of cancer. It works by helping your immune system slow or stop the spread of cancer cells. It is a monoclonal antibody. This medicine may be used for other purposes; ask your health care provider or pharmacist if you have questions. COMMON BRAND NAME(S): Keytruda What should I tell my care team before I take this medication? They need to know if you have any of these conditions: Allogeneic stem cell transplant (uses someone else's stem cells) Autoimmune diseases, such as Crohn disease, ulcerative colitis, lupus History of chest radiation Nervous system problems, such as Guillain-Barre syndrome, myasthenia gravis Organ transplant An unusual or allergic reaction to pembrolizumab, other medications, foods, dyes, or preservatives Pregnant or trying to get pregnant Breast-feeding How should I use this medication? This medication is  injected into a vein. It is given by your care team in a hospital or clinic setting. A special MedGuide will be given to you before each treatment. Be sure to read this information carefully each time. Talk to your care team about the use of this medication in children. While it may be prescribed for children as young as 6 months for selected conditions, precautions do apply. Overdosage: If you think you have taken too much of this medicine contact a poison control center or emergency room at once. NOTE: This medicine is only for you. Do not share this medicine with others. What if I miss a dose? Keep appointments for follow-up doses. It is important not to miss your dose. Call your care team if you are unable to keep an appointment. What may interact with this medication? Interactions have not been studied. This list may not describe all possible interactions. Give your health care provider a list of all the medicines, herbs, non-prescription drugs, or dietary supplements you use. Also tell them if you smoke, drink alcohol, or use illegal drugs. Some items may interact with your medicine. What should I watch for while using this medication? Your condition will be monitored carefully while you are receiving this medication. You may need blood work while taking this medication. This medication may cause serious skin reactions. They can happen weeks to months after starting the medication. Contact your care team right away if you notice fevers or flu-like symptoms with a rash. The rash may be red or purple and then turn into blisters or peeling of the skin. You may also notice a red rash with swelling of the face, lips, or lymph  nodes in your neck or under your arms. Tell your care team right away if you have any change in your eyesight. Talk to your care team if you may be pregnant. Serious birth defects can occur if you take this medication during pregnancy and for 4 months after the last dose. You  will need a negative pregnancy test before starting this medication. Contraception is recommended while taking this medication and for 4 months after the last dose. Your care team can help you find the option that works for you. Do not breastfeed while taking this medication and for 4 months after the last dose. What side effects may I notice from receiving this medication? Side effects that you should report to your care team as soon as possible: Allergic reactions--skin rash, itching, hives, swelling of the face, lips, tongue, or throat Dry cough, shortness of breath or trouble breathing Eye pain, redness, irritation, or discharge with blurry or decreased vision Heart muscle inflammation--unusual weakness or fatigue, shortness of breath, chest pain, fast or irregular heartbeat, dizziness, swelling of the ankles, feet, or hands Hormone gland problems--headache, sensitivity to light, unusual weakness or fatigue, dizziness, fast or irregular heartbeat, increased sensitivity to cold or heat, excessive sweating, constipation, hair loss, increased thirst or amount of urine, tremors or shaking, irritability Infusion reactions--chest pain, shortness of breath or trouble breathing, feeling faint or lightheaded Kidney injury (glomerulonephritis)--decrease in the amount of urine, red or dark brown urine, foamy or bubbly urine, swelling of the ankles, hands, or feet Liver injury--right upper belly pain, loss of appetite, nausea, light-colored stool, dark yellow or brown urine, yellowing skin or eyes, unusual weakness or fatigue Pain, tingling, or numbness in the hands or feet, muscle weakness, change in vision, confusion or trouble speaking, loss of balance or coordination, trouble walking, seizures Rash, fever, and swollen lymph nodes Redness, blistering, peeling, or loosening of the skin, including inside the mouth Sudden or severe stomach pain, bloody diarrhea, fever, nausea, vomiting Side effects that  usually do not require medical attention (report to your care team if they continue or are bothersome): Bone, joint, or muscle pain Diarrhea Fatigue Loss of appetite Nausea Skin rash This list may not describe all possible side effects. Call your doctor for medical advice about side effects. You may report side effects to FDA at 1-800-FDA-1088. Where should I keep my medication? This medication is given in a hospital or clinic. It will not be stored at home. NOTE: This sheet is a summary. It may not cover all possible information. If you have questions about this medicine, talk to your doctor, pharmacist, or health care provider.  2023 Elsevier/Gold Standard (2021-06-27 00:00:00)  Pemetrexed Injection What is this medication? PEMETREXED (PEM e TREX ed) treats some types of cancer. It works by slowing down the growth of cancer cells. This medicine may be used for other purposes; ask your health care provider or pharmacist if you have questions. COMMON BRAND NAME(S): Alimta, PEMFEXY What should I tell my care team before I take this medication? They need to know if you have any of these conditions: Infection, such as chickenpox, cold sores, or herpes Kidney disease Low blood cell levels (white cells, red cells, and platelets) Lung or breathing disease, such as asthma Radiation therapy An unusual or allergic reaction to pemetrexed, other medications, foods, dyes, or preservatives If you or your partner are pregnant or trying to get pregnant Breast-feeding How should I use this medication? This medication is injected into a vein. It is given  by your care team in a hospital or clinic setting. Talk to your care team about the use of this medication in children. Special care may be needed. Overdosage: If you think you have taken too much of this medicine contact a poison control center or emergency room at once. NOTE: This medicine is only for you. Do not share this medicine with  others. What if I miss a dose? Keep appointments for follow-up doses. It is important not to miss your dose. Call your care team if you are unable to keep an appointment. What may interact with this medication? Do not take this medication with any of the following: Live virus vaccines This medication may also interact with the following: Ibuprofen This list may not describe all possible interactions. Give your health care provider a list of all the medicines, herbs, non-prescription drugs, or dietary supplements you use. Also tell them if you smoke, drink alcohol, or use illegal drugs. Some items may interact with your medicine. What should I watch for while using this medication? Your condition will be monitored carefully while you are receiving this medication. This medication may make you feel generally unwell. This is not uncommon as chemotherapy can affect healthy cells as well as cancer cells. Report any side effects. Continue your course of treatment even though you feel ill unless your care team tells you to stop. This medication can cause serious side effects. To reduce the risk, your care team may give you other medications to take before receiving this one. Be sure to follow the directions from your care team. This medication can cause a rash or redness in areas of the body that have previously had radiation therapy. If you have had radiation therapy, tell your care team if you notice a rash in this area. This medication may increase your risk of getting an infection. Call your care team for advice if you get a fever, chills, sore throat, or other symptoms of a cold or flu. Do not treat yourself. Try to avoid being around people who are sick. Be careful brushing or flossing your teeth or using a toothpick because you may get an infection or bleed more easily. If you have any dental work done, tell your dentist you are receiving this medication. Avoid taking medications that contain  aspirin, acetaminophen, ibuprofen, naproxen, or ketoprofen unless instructed by your care team. These medications may hide a fever. Check with your care team if you have severe diarrhea, nausea, and vomiting, or if you sweat a lot. The loss of too much body fluid may make it dangerous for you to take this medication. Talk to your care team if you or your partner wish to become pregnant or think either of you might be pregnant. This medication can cause serious birth defects if taken during pregnancy and for 6 months after the last dose. A negative pregnancy test is required before starting this medication. A reliable form of contraception is recommended while taking this medication and for 6 months after the last dose. Talk to your care team about reliable forms of contraception. Do not father a child while taking this medication and for 3 months after the last dose. Use a condom while having sex during this time period. Do not breastfeed while taking this medication and for 1 week after the last dose. This medication may cause infertility. Talk to your care team if you are concerned about your fertility. What side effects may I notice from receiving this medication? Side effects  that you should report to your care team as soon as possible: Allergic reactions--skin rash, itching, hives, swelling of the face, lips, tongue, or throat Dry cough, shortness of breath or trouble breathing Infection--fever, chills, cough, sore throat, wounds that don't heal, pain or trouble when passing urine, general feeling of discomfort or being unwell Kidney injury--decrease in the amount of urine, swelling of the ankles, hands, or feet Low red blood cell level--unusual weakness or fatigue, dizziness, headache, trouble breathing Redness, blistering, peeling, or loosening of the skin, including inside the mouth Unusual bruising or bleeding Side effects that usually do not require medical attention (report to your care team  if they continue or are bothersome): Fatigue Loss of appetite Nausea Vomiting This list may not describe all possible side effects. Call your doctor for medical advice about side effects. You may report side effects to FDA at 1-800-FDA-1088. Where should I keep my medication? This medication is given in a hospital or clinic. It will not be stored at home. NOTE: This sheet is a summary. It may not cover all possible information. If you have questions about this medicine, talk to your doctor, pharmacist, or health care provider.  2023 Elsevier/Gold Standard (2021-07-11 00:00:00)       To help prevent nausea and vomiting after your treatment, we encourage you to take your nausea medication as directed.  BELOW ARE SYMPTOMS THAT SHOULD BE REPORTED IMMEDIATELY: *FEVER GREATER THAN 100.4 F (38 C) OR HIGHER *CHILLS OR SWEATING *NAUSEA AND VOMITING THAT IS NOT CONTROLLED WITH YOUR NAUSEA MEDICATION *UNUSUAL SHORTNESS OF BREATH *UNUSUAL BRUISING OR BLEEDING *URINARY PROBLEMS (pain or burning when urinating, or frequent urination) *BOWEL PROBLEMS (unusual diarrhea, constipation, pain near the anus) TENDERNESS IN MOUTH AND THROAT WITH OR WITHOUT PRESENCE OF ULCERS (sore throat, sores in mouth, or a toothache) UNUSUAL RASH, SWELLING OR PAIN  UNUSUAL VAGINAL DISCHARGE OR ITCHING   Items with * indicate a potential emergency and should be followed up as soon as possible or go to the Emergency Department if any problems should occur.  Please show the CHEMOTHERAPY ALERT CARD or IMMUNOTHERAPY ALERT CARD at check-in to the Emergency Department and triage nurse.  Should you have questions after your visit or need to cancel or reschedule your appointment, please contact [facility] [phone]  and follow the prompts.  Office hours are 8:00 a.m. to 4:30 p.m. Monday - Friday. Please note that voicemails left after 4:00 p.m. may not be returned until the following business day.  We  are closed weekends and major holidays. You have access to a nurse at all times for urgent questions. Please call the main number to the clinic [phone] and follow the prompts.  For any non-urgent questions, you may also contact your provider using MyChart. We now offer e-Visits for anyone 30 and older to request care online for non-urgent symptoms. For details visit [email or web address].   Also download the MyChart app! Go to the app store, search "MyChart", open the app, select [facility], and log in with your MyChart username and password.  Masks are optional in the cancer centers. If you would like for your care team to wear a mask while they are taking care of you, please let them know. You may have one support person who is at least 70 years old accompany you for your appointments.

## 2021-12-15 NOTE — Progress Notes (Signed)
Patient presents today for treatment D1 C5.  Labs pending. Patient has complaints of numbness in her left foot with her toes being the only affected area of the foot per patient's words. Patient states it started after her last treatment.   Magnesium 1.5 , potassium 3.2. Treatment labs within parameters. Reported labs to Dr. Su Hilt. [facility], [facility] by [employer]. Message received to proceed with treatment today. Infuse 2 grams Magnesium Sulfate. Give 40 mEq Potassium PO x 1 dose today. Give B12 injection today. Message received to discontinue Udenyca injections. Scheduling notified and aware.   Keytruda/Alimta given today per MD orders. Tolerated infusion without adverse affects. Vital signs stable. No complaints at this time. Discharged from clinic ambulatory in stable condition. Alert and oriented x 3. F/U with [facility] as scheduled.

## 2021-12-15 NOTE — Progress Notes (Signed)
B-12 every 9 weeks per Dr Delton Coombes, Next dose is due 01/12/22  T.O. Dr Rhys Martini, PharmD

## 2021-12-16 ENCOUNTER — Inpatient Hospital Stay: Payer: Medicare HMO

## 2021-12-28 DIAGNOSIS — Z96612 Presence of left artificial shoulder joint: Secondary | ICD-10-CM | POA: Diagnosis not present

## 2021-12-28 DIAGNOSIS — M25511 Pain in right shoulder: Secondary | ICD-10-CM | POA: Diagnosis not present

## 2022-01-06 DIAGNOSIS — M170 Bilateral primary osteoarthritis of knee: Secondary | ICD-10-CM | POA: Diagnosis not present

## 2022-01-06 DIAGNOSIS — M25562 Pain in left knee: Secondary | ICD-10-CM | POA: Diagnosis not present

## 2022-01-06 DIAGNOSIS — M25561 Pain in right knee: Secondary | ICD-10-CM | POA: Diagnosis not present

## 2022-01-12 ENCOUNTER — Other Ambulatory Visit: Payer: Self-pay | Admitting: Hematology

## 2022-01-12 ENCOUNTER — Inpatient Hospital Stay: Payer: Medicare HMO

## 2022-01-12 ENCOUNTER — Inpatient Hospital Stay: Payer: Medicare HMO | Attending: Neurosurgery

## 2022-01-12 ENCOUNTER — Inpatient Hospital Stay (HOSPITAL_BASED_OUTPATIENT_CLINIC_OR_DEPARTMENT_OTHER): Payer: Medicare HMO | Admitting: Hematology

## 2022-01-12 VITALS — BP 115/52 | HR 82 | Temp 97.9°F | Resp 18

## 2022-01-12 DIAGNOSIS — Z79899 Other long term (current) drug therapy: Secondary | ICD-10-CM | POA: Diagnosis not present

## 2022-01-12 DIAGNOSIS — C3492 Malignant neoplasm of unspecified part of left bronchus or lung: Secondary | ICD-10-CM | POA: Insufficient documentation

## 2022-01-12 DIAGNOSIS — Z5112 Encounter for antineoplastic immunotherapy: Secondary | ICD-10-CM | POA: Insufficient documentation

## 2022-01-12 DIAGNOSIS — I2699 Other pulmonary embolism without acute cor pulmonale: Secondary | ICD-10-CM | POA: Diagnosis not present

## 2022-01-12 DIAGNOSIS — Z5111 Encounter for antineoplastic chemotherapy: Secondary | ICD-10-CM | POA: Diagnosis not present

## 2022-01-12 DIAGNOSIS — D320 Benign neoplasm of cerebral meninges: Secondary | ICD-10-CM | POA: Diagnosis not present

## 2022-01-12 DIAGNOSIS — Z7901 Long term (current) use of anticoagulants: Secondary | ICD-10-CM | POA: Insufficient documentation

## 2022-01-12 DIAGNOSIS — C3490 Malignant neoplasm of unspecified part of unspecified bronchus or lung: Secondary | ICD-10-CM

## 2022-01-12 DIAGNOSIS — Z23 Encounter for immunization: Secondary | ICD-10-CM

## 2022-01-12 LAB — COMPREHENSIVE METABOLIC PANEL
ALT: 26 U/L (ref 0–44)
AST: 21 U/L (ref 15–41)
Albumin: 3.1 g/dL — ABNORMAL LOW (ref 3.5–5.0)
Alkaline Phosphatase: 74 U/L (ref 38–126)
Anion gap: 7 (ref 5–15)
BUN: 12 mg/dL (ref 8–23)
CO2: 24 mmol/L (ref 22–32)
Calcium: 8.3 mg/dL — ABNORMAL LOW (ref 8.9–10.3)
Chloride: 110 mmol/L (ref 98–111)
Creatinine, Ser: 0.9 mg/dL (ref 0.44–1.00)
GFR, Estimated: >60 mL/min (ref >60)
Glucose, Bld: 100 mg/dL — ABNORMAL HIGH (ref 70–99)
Potassium: 3.3 mmol/L — ABNORMAL LOW (ref 3.5–5.1)
Sodium: 141 mmol/L (ref 135–145)
Total Bilirubin: 0.5 mg/dL (ref 0.3–1.2)
Total Protein: 6.2 g/dL — ABNORMAL LOW (ref 6.5–8.1)

## 2022-01-12 LAB — CBC WITH DIFFERENTIAL/PLATELET
Abs Immature Granulocytes: 0.01 10*3/uL (ref 0.00–0.07)
Basophils Absolute: 0 10*3/uL (ref 0.0–0.1)
Basophils Relative: 0 %
Eosinophils Absolute: 0.1 10*3/uL (ref 0.0–0.5)
Eosinophils Relative: 1 %
HCT: 32.2 % — ABNORMAL LOW (ref 36.0–46.0)
Hemoglobin: 9.9 g/dL — ABNORMAL LOW (ref 12.0–15.0)
Immature Granulocytes: 0 %
Lymphocytes Relative: 10 %
Lymphs Abs: 0.6 10*3/uL — ABNORMAL LOW (ref 0.7–4.0)
MCH: 30.3 pg (ref 26.0–34.0)
MCHC: 30.7 g/dL (ref 30.0–36.0)
MCV: 98.5 fL (ref 80.0–100.0)
Monocytes Absolute: 0.5 10*3/uL (ref 0.1–1.0)
Monocytes Relative: 8 %
Neutro Abs: 4.9 10*3/uL (ref 1.7–7.7)
Neutrophils Relative %: 81 %
Platelets: 303 10*3/uL (ref 150–400)
RBC: 3.27 MIL/uL — ABNORMAL LOW (ref 3.87–5.11)
RDW: 19.4 % — ABNORMAL HIGH (ref 11.5–15.5)
WBC: 6.1 10*3/uL (ref 4.0–10.5)
nRBC: 0 % (ref 0.0–0.2)

## 2022-01-12 LAB — MAGNESIUM: Magnesium: 1.8 mg/dL (ref 1.7–2.4)

## 2022-01-12 LAB — TSH: TSH: 1.255 u[IU]/mL (ref 0.350–4.500)

## 2022-01-12 MED ORDER — SODIUM CHLORIDE 0.9 % IV SOLN: Freq: Once | INTRAVENOUS | Status: AC

## 2022-01-12 MED ORDER — INFLUENZA VAC A&B SA ADJ QUAD 0.5 ML IM PRSY
0.5000 mL | PREFILLED_SYRINGE | Freq: Once | INTRAMUSCULAR | Status: AC
  Administered 2022-01-12: 0.5 mL via INTRAMUSCULAR
  Filled 2022-01-12: qty 0.5

## 2022-01-12 MED ORDER — ONDANSETRON HCL 4 MG/2ML IJ SOLN
8.0000 mg | Freq: Once | INTRAMUSCULAR | Status: AC
  Administered 2022-01-12: 8 mg via INTRAVENOUS
  Filled 2022-01-12: qty 4

## 2022-01-12 MED ORDER — SODIUM CHLORIDE 0.9 % IV SOLN: 8.0000 mg | Freq: Once | INTRAVENOUS | Status: DC

## 2022-01-12 MED ORDER — CYANOCOBALAMIN 1000 MCG/ML IJ SOLN
1000.0000 ug | Freq: Once | INTRAMUSCULAR | Status: AC
  Administered 2022-01-12: 1000 ug via INTRAMUSCULAR
  Filled 2022-01-12: qty 1

## 2022-01-12 MED ORDER — HEPARIN SOD (PORK) LOCK FLUSH 100 UNIT/ML IV SOLN
500.0000 [IU] | Freq: Once | INTRAVENOUS | Status: AC | PRN
  Administered 2022-01-12: 500 [IU]

## 2022-01-12 MED ORDER — SODIUM CHLORIDE 0.9 % IV SOLN
500.0000 mg/m2 | Freq: Once | INTRAVENOUS | Status: AC
  Administered 2022-01-12: 1100 mg via INTRAVENOUS
  Filled 2022-01-12: qty 4

## 2022-01-12 MED ORDER — SODIUM CHLORIDE 0.9 % IV SOLN
200.0000 mg | Freq: Once | INTRAVENOUS | Status: AC
  Administered 2022-01-12: 200 mg via INTRAVENOUS
  Filled 2022-01-12: qty 8

## 2022-01-12 MED ORDER — SODIUM CHLORIDE 0.9% FLUSH
10.0000 mL | INTRAVENOUS | Status: DC | PRN
  Administered 2022-01-12: 10 mL

## 2022-01-12 MED ORDER — XIIDRA 5 % OP SOLN: 1.0000 [drp] | Freq: Two times a day (BID) | OPHTHALMIC | 3 refills | Status: DC

## 2022-01-12 NOTE — Patient Instructions (Signed)
[facility]  Discharge Instructions: Thank you for choosing [facility] to provide your oncology and hematology care.  If you have a lab appointment with the [facility], please come in thru the Main Entrance and check in at the main information desk.  Wear comfortable clothing and clothing appropriate for easy access to any Portacath or PICC line.   We strive to give you quality time with your provider. You may need to reschedule your appointment if you arrive late (15 or more minutes).  Arriving late affects you and other patients whose appointments are after yours.  Also, if you miss three or more appointments without notifying the office, you may be dismissed from the clinic at the provider's discretion.      For prescription refill requests, have your pharmacy contact our office and allow 72 hours for refills to be completed.    Today you received the following chemotherapy and/or immunotherapy agents Keytruda/Alimta.  Pemetrexed Injection What is this medication? PEMETREXED (PEM e TREX ed) treats some types of cancer. It works by slowing down the growth of cancer cells. This medicine may be used for other purposes; ask your health care provider or pharmacist if you have questions. COMMON BRAND NAME(S): Alimta, PEMFEXY What should I tell my care team before I take this medication? They need to know if you have any of these conditions: Infection, such as chickenpox, cold sores, or herpes Kidney disease Low blood cell levels (white cells, red cells, and platelets) Lung or breathing disease, such as asthma Radiation therapy An unusual or allergic reaction to pemetrexed, other medications, foods, dyes, or preservatives If you or your partner are pregnant or trying to get pregnant Breast-feeding How should I use this medication? This medication is injected into a vein. It is given by your care team in a hospital or clinic setting. Talk to your care  team about the use of this medication in children. Special care may be needed. Overdosage: If you think you have taken too much of this medicine contact a poison control center or emergency room at once. NOTE: This medicine is only for you. Do not share this medicine with others. What if I miss a dose? Keep appointments for follow-up doses. It is important not to miss your dose. Call your care team if you are unable to keep an appointment. What may interact with this medication? Do not take this medication with any of the following: Live virus vaccines This medication may also interact with the following: Ibuprofen This list may not describe all possible interactions. Give your health care provider a list of all the medicines, herbs, non-prescription drugs, or dietary supplements you use. Also tell them if you smoke, drink alcohol, or use illegal drugs. Some items may interact with your medicine. What should I watch for while using this medication? Your condition will be monitored carefully while you are receiving this medication. This medication may make you feel generally unwell. This is not uncommon as chemotherapy can affect healthy cells as well as cancer cells. Report any side effects. Continue your course of treatment even though you feel ill unless your care team tells you to stop. This medication can cause serious side effects. To reduce the risk, your care team may give you other medications to take before receiving this one. Be sure to follow the directions from your care team. This medication can cause a rash or redness in areas of the body that have previously had radiation  therapy. If you have had radiation therapy, tell your care team if you notice a rash in this area. This medication may increase your risk of getting an infection. Call your care team for advice if you get a fever, chills, sore throat, or other symptoms of a cold or flu. Do not treat yourself. Try to avoid being around  people who are sick. Be careful brushing or flossing your teeth or using a toothpick because you may get an infection or bleed more easily. If you have any dental work done, tell your dentist you are receiving this medication. Avoid taking medications that contain aspirin, acetaminophen, ibuprofen, naproxen, or ketoprofen unless instructed by your care team. These medications may hide a fever. Check with your care team if you have severe diarrhea, nausea, and vomiting, or if you sweat a lot. The loss of too much body fluid may make it dangerous for you to take this medication. Talk to your care team if you or your partner wish to become pregnant or think either of you might be pregnant. This medication can cause serious birth defects if taken during pregnancy and for 6 months after the last dose. A negative pregnancy test is required before starting this medication. A reliable form of contraception is recommended while taking this medication and for 6 months after the last dose. Talk to your care team about reliable forms of contraception. Do not father a child while taking this medication and for 3 months after the last dose. Use a condom while having sex during this time period. Do not breastfeed while taking this medication and for 1 week after the last dose. This medication may cause infertility. Talk to your care team if you are concerned about your fertility. What side effects may I notice from receiving this medication? Side effects that you should report to your care team as soon as possible: Allergic reactions--skin rash, itching, hives, swelling of the face, lips, tongue, or throat Dry cough, shortness of breath or trouble breathing Infection--fever, chills, cough, sore throat, wounds that don't heal, pain or trouble when passing urine, general feeling of discomfort or being unwell Kidney injury--decrease in the amount of urine, swelling of the ankles, hands, or feet Low red blood cell  level--unusual weakness or fatigue, dizziness, headache, trouble breathing Redness, blistering, peeling, or loosening of the skin, including inside the mouth Unusual bruising or bleeding Side effects that usually do not require medical attention (report to your care team if they continue or are bothersome): Fatigue Loss of appetite Nausea Vomiting This list may not describe all possible side effects. Call your doctor for medical advice about side effects. You may report side effects to FDA at 1-800-FDA-1088. Where should I keep my medication? This medication is given in a hospital or clinic. It will not be stored at home. NOTE: This sheet is a summary. It may not cover all possible information. If you have questions about this medicine, talk to your doctor, pharmacist, or health care provider.  2023 Elsevier/Gold Standard (2021-07-11 00:00:00)    Pembrolizumab Injection What is this medication? PEMBROLIZUMAB (PEM broe LIZ ue mab) treats some types of cancer. It works by helping your immune system slow or stop the spread of cancer cells. It is a monoclonal antibody. This medicine may be used for other purposes; ask your health care provider or pharmacist if you have questions. COMMON BRAND NAME(S): Keytruda What should I tell my care team before I take this medication? They need to know if you  have any of these conditions: Allogeneic stem cell transplant (uses someone else's stem cells) Autoimmune diseases, such as Crohn disease, ulcerative colitis, lupus History of chest radiation Nervous system problems, such as Guillain-Barre syndrome, myasthenia gravis Organ transplant An unusual or allergic reaction to pembrolizumab, other medications, foods, dyes, or preservatives Pregnant or trying to get pregnant Breast-feeding How should I use this medication? This medication is injected into a vein. It is given by your care team in a hospital or clinic setting. A special MedGuide will be  given to you before each treatment. Be sure to read this information carefully each time. Talk to your care team about the use of this medication in children. While it may be prescribed for children as young as 6 months for selected conditions, precautions do apply. Overdosage: If you think you have taken too much of this medicine contact a poison control center or emergency room at once. NOTE: This medicine is only for you. Do not share this medicine with others. What if I miss a dose? Keep appointments for follow-up doses. It is important not to miss your dose. Call your care team if you are unable to keep an appointment. What may interact with this medication? Interactions have not been studied. This list may not describe all possible interactions. Give your health care provider a list of all the medicines, herbs, non-prescription drugs, or dietary supplements you use. Also tell them if you smoke, drink alcohol, or use illegal drugs. Some items may interact with your medicine. What should I watch for while using this medication? Your condition will be monitored carefully while you are receiving this medication. You may need blood work while taking this medication. This medication may cause serious skin reactions. They can happen weeks to months after starting the medication. Contact your care team right away if you notice fevers or flu-like symptoms with a rash. The rash may be red or purple and then turn into blisters or peeling of the skin. You may also notice a red rash with swelling of the face, lips, or lymph nodes in your neck or under your arms. Tell your care team right away if you have any change in your eyesight. Talk to your care team if you may be pregnant. Serious birth defects can occur if you take this medication during pregnancy and for 4 months after the last dose. You will need a negative pregnancy test before starting this medication. Contraception is recommended while taking this  medication and for 4 months after the last dose. Your care team can help you find the option that works for you. Do not breastfeed while taking this medication and for 4 months after the last dose. What side effects may I notice from receiving this medication? Side effects that you should report to your care team as soon as possible: Allergic reactions--skin rash, itching, hives, swelling of the face, lips, tongue, or throat Dry cough, shortness of breath or trouble breathing Eye pain, redness, irritation, or discharge with blurry or decreased vision Heart muscle inflammation--unusual weakness or fatigue, shortness of breath, chest pain, fast or irregular heartbeat, dizziness, swelling of the ankles, feet, or hands Hormone gland problems--headache, sensitivity to light, unusual weakness or fatigue, dizziness, fast or irregular heartbeat, increased sensitivity to cold or heat, excessive sweating, constipation, hair loss, increased thirst or amount of urine, tremors or shaking, irritability Infusion reactions--chest pain, shortness of breath or trouble breathing, feeling faint or lightheaded Kidney injury (glomerulonephritis)--decrease in the amount of urine, red or  dark Rosmary Dionisio urine, foamy or bubbly urine, swelling of the ankles, hands, or feet Liver injury--right upper belly pain, loss of appetite, nausea, light-colored stool, dark yellow or Daxen Lanum urine, yellowing skin or eyes, unusual weakness or fatigue Pain, tingling, or numbness in the hands or feet, muscle weakness, change in vision, confusion or trouble speaking, loss of balance or coordination, trouble walking, seizures Rash, fever, and swollen lymph nodes Redness, blistering, peeling, or loosening of the skin, including inside the mouth Sudden or severe stomach pain, bloody diarrhea, fever, nausea, vomiting Side effects that usually do not require medical attention (report to your care team if they continue or are bothersome): Bone, joint, or  muscle pain Diarrhea Fatigue Loss of appetite Nausea Skin rash This list may not describe all possible side effects. Call your doctor for medical advice about side effects. You may report side effects to FDA at 1-800-FDA-1088. Where should I keep my medication? This medication is given in a hospital or clinic. It will not be stored at home. NOTE: This sheet is a summary. It may not cover all possible information. If you have questions about this medicine, talk to your doctor, pharmacist, or health care provider.  2023 Elsevier/Gold Standard (2012-11-25 00:00:00)        To help prevent nausea and vomiting after your treatment, we encourage you to take your nausea medication as directed.  BELOW ARE SYMPTOMS THAT SHOULD BE REPORTED IMMEDIATELY: *FEVER GREATER THAN 100.4 F (38 C) OR HIGHER *CHILLS OR SWEATING *NAUSEA AND VOMITING THAT IS NOT CONTROLLED WITH YOUR NAUSEA MEDICATION *UNUSUAL SHORTNESS OF BREATH *UNUSUAL BRUISING OR BLEEDING *URINARY PROBLEMS (pain or burning when urinating, or frequent urination) *BOWEL PROBLEMS (unusual diarrhea, constipation, pain near the anus) TENDERNESS IN MOUTH AND THROAT WITH OR WITHOUT PRESENCE OF ULCERS (sore throat, sores in mouth, or a toothache) UNUSUAL RASH, SWELLING OR PAIN  UNUSUAL VAGINAL DISCHARGE OR ITCHING   Items with * indicate a potential emergency and should be followed up as soon as possible or go to the Emergency Department if any problems should occur.  Please show the CHEMOTHERAPY ALERT CARD or IMMUNOTHERAPY ALERT CARD at check-in to the Emergency Department and triage nurse.  Should you have questions after your visit or need to cancel or reschedule your appointment, please contact [facility] [phone]  and follow the prompts.  Office hours are 8:00 a.m. to 4:30 p.m. Monday - Friday. Please note that voicemails left after 4:00 p.m. may not be returned until the following business day.  We are closed  weekends and major holidays. You have access to a nurse at all times for urgent questions. Please call the main number to the clinic [phone] and follow the prompts.  For any non-urgent questions, you may also contact your provider using MyChart. We now offer e-Visits for anyone 64 and older to request care online for non-urgent symptoms. For details visit [email or web address].   Also download the MyChart app! Go to the app store, search "MyChart", open the app, select [facility], and log in with your MyChart username and password.  Masks are optional in the cancer centers. If you would like for your care team to wear a mask while they are taking care of you, please let them know. You may have one support person who is at least 70 years old accompany you for your appointments.

## 2022-01-12 NOTE — Progress Notes (Signed)
Patient has been examined by Dr. Katragadda, and vital signs and labs have been reviewed. ANC, Creatinine, LFTs, hemoglobin, and platelets are within treatment parameters per M.D. - pt may proceed with treatment.  Primary RN and pharmacy notified.  

## 2022-01-12 NOTE — Progress Notes (Signed)
[place], [place] [address]   CLINIC:  Medical Oncology/Hematology  PCP:  Iona Beard, [address] / [place] [address] [phone]   REASON FOR VISIT:  Follow-up for stage IV adenocarcinoma of the lung, negative for targetable mutations  PRIOR THERAPY:  1. Chemoradiation with carboplatin and paclitaxel from 01/07/2019 to 02/11/2019. 2. Consolidation with durvalumab from 03/19/2019 to 04/16/2019, held due to pneumonitis.  NGS Results: not done  CURRENT THERAPY: Consolidation with durvalumab every 2 weeks; brain mets SRS in 5 fractions  BRIEF ONCOLOGIC HISTORY:  Oncology History  Adenocarcinoma of left lung ([place])  12/09/2018 Initial Diagnosis   Adenocarcinoma of left lung ([place])   01/02/2019 Cancer Staging   Staging form: Lung, AJCC 8th Edition - Clinical stage from 01/02/2019: Stage IIIB (cT3, cN2, cM0) - Signed by Derek Jack, MD on 01/02/2019   01/07/2019 - 02/11/2019 Chemotherapy   The patient had palonosetron (ALOXI) injection 0.25 mg, 0.25 mg, Intravenous,  Once, 6 of 6 cycles Administration: 0.25 mg (01/07/2019), 0.25 mg (01/14/2019), 0.25 mg (01/21/2019), 0.25 mg (01/28/2019), 0.25 mg (02/04/2019), 0.25 mg (02/11/2019) CARBOplatin (PARAPLATIN) 270 mg in sodium chloride 0.9 % 250 mL chemo infusion, 270 mg (100 % of original dose 266.4 mg), Intravenous,  Once, 6 of 6 cycles Dose modification:   (original dose 266.4 mg, Cycle 1),   (original dose 266.4 mg, Cycle 2),   (original dose 266.4 mg, Cycle 3),   (original dose 266.4 mg, Cycle 4) Administration: 270 mg (01/07/2019), 270 mg (01/14/2019), 270 mg (01/21/2019), 270 mg (01/28/2019), 270 mg (02/04/2019), 270 mg (02/11/2019) PACLitaxel (TAXOL) 108 mg in sodium chloride 0.9 % 250 mL chemo infusion (</= 29m/m2), 45 mg/m2 = 108 mg, Intravenous,  Once, 6 of 6 cycles Administration: 108 mg (01/07/2019), 108 mg (01/14/2019), 108 mg (01/21/2019), 108 mg (01/28/2019), 108  mg (02/04/2019), 108 mg (02/11/2019) fosaprepitant (EMEND) 150 mg, dexamethasone (DECADRON) 12 mg in sodium chloride 0.9 % 145 mL IVPB, , Intravenous,  Once, 5 of 5 cycles Administration:  (01/14/2019),  (01/21/2019),  (01/28/2019),  (02/04/2019),  (02/11/2019)  for chemotherapy treatment.    03/19/2019 - 07/23/2020 Chemotherapy   Patient is on Treatment Plan : LUNG DURVALUMAB [id number]    09/22/2021 - 11/04/2021 Chemotherapy   Patient is on Treatment Plan : LUNG Carboplatin (5) + Pemetrexed (500) + Pembrolizumab (200) D1 q21d Induction x 4 cycles / Maintenance Pemetrexed (500) + Pembrolizumab (200) D1 q21d     09/22/2021 -  Chemotherapy   Patient is on Treatment Plan : LUNG Carboplatin (5) + Pemetrexed (500) + Pembrolizumab (200) D1 q21d Induction x 4 cycles / Maintenance Pemetrexed (500) + Pembrolizumab (200) D1 q21d       CANCER STAGING:  Cancer Staging  Adenocarcinoma of left lung ([place] Staging form: Lung, AJCC 8th Edition - Clinical stage from 01/02/2019: Stage IIIB (cT3, cN2, cM0) - Signed by KDerek Jack MD on 01/02/2019 - Pathologic stage from 09/07/2021: Stage IVA (pTX, pNX, pM1b) - Unsigned   INTERVAL HISTORY:  Ms. AAniesa Boback a 71y.o. female, seen for follow-up of for metastatic lung cancer to the liver.  She is here for toxicity assessment and next cycle of chemotherapy.  She is receiving maintenance pembrolizumab and pemetrexed.  She reports watering eyes with dryness and itching.  She has tried artificial tears over-the-counter which did not help.  She also had nausea and dizziness 4 to 5 days after each treatment.   REVIEW OF SYSTEMS:  Review of Systems  Eyes:  Positive for eye problems (Dryness and tearing).  Respiratory:  Positive for shortness of breath (On exertion).   Musculoskeletal:  Positive for arthralgias (7/10 R knee).  Neurological:  Positive for dizziness.  All other systems reviewed and are negative.   PAST MEDICAL/SURGICAL HISTORY:  Past Medical  History:  Diagnosis Date   Anemia    Aortic stenosis    Arthritis    Coronary artery calcification seen on CT scan    Essential hypertension    GERD (gastroesophageal reflux disease)    History of lung cancer    Stage III adenocarcinoma status post chemoradiation   Port-A-Cath in place 01/06/2019   Type 2 diabetes mellitus ([facility])    Past Surgical History:  Procedure Laterality Date   CHOLECYSTECTOMY  1997   COLONOSCOPY     GASTRIC BYPASS     INCISIONAL HERNIA REPAIR  04/11/11   IR IMAGING GUIDED PORT INSERTION  12/27/2018   Right   IR US GUIDE BX ASP/DRAIN  08/08/2021   LAPAROSCOPIC SALPINGOOPHERECTOMY     LAPAROTOMY  04/11/2011   Procedure: EXPLORATORY LAPAROTOMY;  Surgeon: Joyice Faster. [place], MD;  Location: [facility];  Service: General;  Laterality: N/A;  closure port hole   RIGHT/LEFT HEART CATH AND CORONARY ANGIOGRAPHY N/A 12/29/2019   Procedure: RIGHT/LEFT HEART CATH AND CORONARY ANGIOGRAPHY;  Surgeon: Troy Sine, MD;  Location: [facility];  Service: Cardiovascular;  Laterality: N/A;   TOTAL HIP ARTHROPLASTY  03/07/2012   Procedure: TOTAL HIP ARTHROPLASTY ANTERIOR APPROACH;  Surgeon: Mauri Pole, MD;  Location: [facility];  Service: Orthopedics;  Laterality: Right;   TOTAL SHOULDER ARTHROPLASTY Left 01/21/2015   TOTAL SHOULDER ARTHROPLASTY Left 01/21/2015   Procedure: LEFT TOTAL SHOULDER ARTHROPLASTY;  Surgeon: Justice Britain, MD;  Location: [facility];  Service: Orthopedics;  Laterality: Left;   VAGINAL HYSTERECTOMY      SOCIAL HISTORY:  Social History   Socioeconomic History   Marital status: Single    Spouse name: Not on file   Number of children: Not on file   Years of education: 12th grade   Highest education level: Not on file  Occupational History   Occupation: Employed    Employer: [employer]  Tobacco Use   Smoking status: Former    Packs/day: 1.00    Years: 12.00    Total pack years: 12.00    Types: Cigarettes    Quit date: 03/20/1976    Years  since quitting: 45.8   Smokeless tobacco: Never  Vaping Use   Vaping Use: Never used  Substance and Sexual Activity   Alcohol use: No   Drug use: No   Sexual activity: Not Currently  Other Topics Concern   Not on file  Social History Narrative   Not on file   Social Determinants of Health   Financial Resource Strain: Low Risk  (12/06/2018)   Overall Financial Resource Strain (CARDIA)    Difficulty of Paying Living Expenses: Not very hard  Food Insecurity: No Food Insecurity (12/06/2018)   Hunger Vital Sign    Worried About Running Out of Food in the Last Year: Never true    Ran Out of Food in the Last Year: Never true  Transportation Needs: No Transportation Needs (12/06/2018)   PRAPARE - Hydrologist (Medical): No    Lack of Transportation (Non-Medical): No  Physical Activity: Inactive (12/06/2018)   Exercise Vital Sign    Days of Exercise per  Week: 0 days    Minutes of Exercise per Session: 0 min  Stress: No Stress Concern Present (12/06/2018)   [place]    Feeling of Stress : Only a little  Social Connections: Moderately Isolated (12/06/2018)   Social Connection and Isolation Panel [NHANES]    Frequency of Communication with Friends and Family: More than three times a week    Frequency of Social Gatherings with Friends and Family: Once a week    Attends Religious Services: More than 4 times per year    Active Member of [employer] or Organizations: No    Attends Archivist Meetings: Never    Marital Status: Never married  Intimate Partner Violence: Not At Risk (12/06/2018)   Humiliation, Afraid, Rape, and Kick questionnaire    Fear of Current or Ex-Partner: No    Emotionally Abused: No    Physically Abused: No    Sexually Abused: No    FAMILY HISTORY:  Family History  Problem Relation Age of Onset   Breast cancer Mother    COPD Mother    Arthritis Mother    Diabetes  Mother    Hypertension Mother    Hypertension Father    Diabetes Father    Breast cancer Sister    Thyroid cancer Brother    Huntington's disease Maternal Grandmother    Heart attack Brother     CURRENT MEDICATIONS:  Current Outpatient Medications  Medication Sig Dispense Refill   albuterol (VENTOLIN HFA) 108 (90 Base) MCG/ACT inhaler TAKE 2 PUFFS BY MOUTH EVERY 6 HOURS AS NEEDED FOR WHEEZE OR SHORTNESS OF BREATH (Patient taking differently: Inhale 2 puffs into the lungs every 6 (six) hours as needed for wheezing or shortness of breath.) 18 each 6   apixaban (ELIQUIS) 5 MG TABS tablet Take 1 tablet (5 mg total) by mouth 2 (two) times daily. 60 tablet 6   benzonatate (TESSALON) 100 MG capsule Take 1 capsule (100 mg total) by mouth 3 (three) times daily as needed for cough. 21 capsule 0   Calcium Carbonate Antacid (TUMS PO) Take 4 tablets by mouth daily.     carvedilol (COREG) 3.125 MG tablet Take 1 tablet (3.125 mg total) by mouth 2 (two) times daily. 180 tablet 1   Cyanocobalamin (VITAMIN B-12) 2500 MCG SUBL Place 2,500 mcg under the tongue every morning.      diphenhydrAMINE (BENADRYL) 25 MG tablet Take 25 mg by mouth every 6 (six) hours as needed for itching or allergies.     ferrous sulfate 325 (65 FE) MG tablet Take 325 mg by mouth every evening.     fluticasone (FLONASE) 50 MCG/ACT nasal spray Place 1 spray into both nostrils 2 (two) times daily. 16 g 2   folic acid (FOLVITE) 1 MG tablet TAKE 1 TABLET BY MOUTH EVERY DAY 90 tablet 1   furosemide (LASIX) 20 MG tablet Take 20 mg by mouth as needed.     gabapentin (NEURONTIN) 300 MG capsule Take 300 mg by mouth at bedtime as needed (Nerve Pain).     Multiple Vitamin (MULITIVITAMIN WITH MINERALS) TABS Take 1 tablet by mouth daily.     omeprazole (PRILOSEC) 20 MG capsule Take 20 mg by mouth daily.     Potassium Chloride ER 20 MEQ TBCR Take 1 tablet by mouth daily.     cycloSPORINE (RESTASIS) 0.05 % ophthalmic emulsion Place 1 drop into  both eyes 2 (two) times daily. 60 each 3   prochlorperazine (  COMPAZINE) 10 MG tablet Take 1 tablet (10 mg total) by mouth every 6 (six) hours as needed for nausea or vomiting. 30 tablet 3   No current facility-administered medications for this visit.   Facility-Administered Medications Ordered in Other Visits  Medication Dose Route Frequency Provider Last Rate Last Admin   magnesium sulfate 2 GM/50ML IVPB            sodium chloride flush (NS) 0.9 % injection 10 mL  10 mL Intracatheter PRN Derek Jack, MD   10 mL at 01/12/22 1305    ALLERGIES:  No Known Allergies  PHYSICAL EXAM:  Performance status (ECOG): 1 - Symptomatic but completely ambulatory  Vitals:   01/12/22 0846  BP: 135/78  Pulse: 72  Resp: 18  Temp: 97.9 F (36.6 C)  SpO2: 97%   Wt Readings from Last 3 Encounters:  01/12/22 244 lb 0.8 oz (110.7 kg)  12/15/21 242 lb 8.1 oz (110 kg)  11/24/21 241 lb 13.5 oz (109.7 kg)   Physical Exam Vitals reviewed.  Constitutional:      Appearance: Normal appearance.     Comments: In wheelchair  Cardiovascular:     Rate and Rhythm: Normal rate and regular rhythm.     Pulses: Normal pulses.     Heart sounds: Murmur heard.     Systolic murmur is present.  Pulmonary:     Effort: Pulmonary effort is normal.     Breath sounds: Normal breath sounds.  Musculoskeletal:     Right lower leg: No edema.     Left lower leg: No edema.  Neurological:     General: No focal deficit present.     Mental Status: She is alert and oriented to person, place, and time.  Psychiatric:        Mood and Affect: Mood normal.        Behavior: Behavior normal.     LABORATORY DATA:  I have reviewed the labs as listed.     Latest Ref Rng & Units 01/12/2022    8:33 AM 12/15/2021    8:00 AM 11/24/2021    9:05 AM  CBC  WBC 4.0 - 10.5 K/uL 6.1  5.3  5.4   Hemoglobin 12.0 - 15.0 g/dL 9.9  9.7  10.4   Hematocrit 36.0 - 46.0 % 32.2  30.4  32.3   Platelets 150 - 400 K/uL 303  210  237        Latest Ref Rng & Units 01/12/2022    8:33 AM 12/15/2021    8:00 AM 11/24/2021    9:05 AM  CMP  Glucose 70 - 99 mg/dL 100  115  99   BUN 8 - 23 mg/dL _0 Creatinine 0.44 - 1.00 mg/dL 0.90  0.76  0.84   Sodium 135 - 145 mmol/L 141  142  141   Potassium 3.5 - 5.1 mmol/L 3.3  3.2  3.4   Chloride 98 - 111 mmol/L 110  108  109   CO2 22 - 32 mmol/L _1 Calcium 8.9 - 10.3 mg/dL 8.3  8.6  8.5   Total Protein 6.5 - 8.1 g/dL 6.2  6.0  6.3   Total Bilirubin 0.3 - 1.2 mg/dL 0.5  0.5  0.5   Alkaline Phos 38 - 126 U/L 74  85  74   AST 15 - 41 U/L _2 ALT 0 - 44 U/L 26  27  25     DIAGNOSTIC IMAGING:  I have independently reviewed the scans and discussed with the patient. No results found.   ASSESSMENT:  1.  Adenocarcinoma of left lung ([facility]) -Chemoradiation therapy with carboplatin and paclitaxel from 01/07/2019 through 02/11/2019. -Consolidation immunotherapy with durvalumab from 03/19/2019 through 04/15/2018, held due to pneumonitis. -CT chest on 07/02/2019 shows left upper lobe lung mass measuring 2.6 x 2.0 cm.  It shows improvement in size. -MRI of the brain on 07/18/2019 showed left sphenoid wing meningioma measuring 2.6 x 2.0 x 2.6 cm unchanged.  No new enhancing intracranial lesion.  Increased left temporal white matter edema. -Durvalumab restarted on 07/24/2019. -She was evaluated by cardiology with a stress test.  EF was 46%.  She underwent cardiac catheterization which did not show any abnormalities. - 1 year of durvalumab completed on 07/23/2020. - Liver mass biopsy (08/08/2021): Adenocarcinoma, CK7 positive, negative for TTF-1, Napsin a, GATA3, ER, CK20, CDX2 - PET scan (08/25/2021): Right upper lobe lung nodule 9 mm, SUV 5.0.  Groundglass and solid nodule periphery of the right upper lobe 8 mm, SUV 2.45.  Right lobe liver mass 4.5 x 3.6 cm, SUV 7.78. - NGS testing: PD-L1 negative, TMB-low, MSI-stable, K-ras G12 D.  No other targetable mutations. - Carboplatin,  pemetrexed and pembrolizumab 4 cycles from 09/22/2021 through 11/25/2021, followed by maintenance pemetrexed and pembrolizumab -  CT CAP (11/10/2021): Reduced size of liver mass and interval resolution of previously seen groundglass pulmonary nodules.  There is a new inflammatory right upper lobe nodule.  PLAN:  1.  Metastatic adenocarcinoma of the lung to the liver and right lung: - CT CAP on 11/10/2021 shows reduced size of liver mass and interval resolution of previously seen groundglass pulmonary nodules.  New inflammatory right upper lobe lung nodule. - She has nausea and dizziness about 4 to 5 days after each chemo.  She uses Compazine which helps. - Reviewed labs today which showed normal LFTs and CBC.  TSH was normal. - Proceed with maintenance pemetrexed and pembrolizumab today.  RTC 3 weeks.  Plan to repeat CT CAP prior to next visit.   2.  Meningioma: - MRI of the brain on 09/27/2021 with stable meningioma.  No new lesions.  She will have follow-up MRI in January.  3.  Unprovoked pulmonary embolism: - Continue Eliquis 5 mg twice daily indefinitely.  No bleeding issues.  4.  Leg swellings: - Continue Lasix 20 mg daily as needed.  5.  Dry eyes with itching: - She has tried artificial tears which did not help. - We have tried to get her on Xiidra eyedrops which her insurance denied.  We will start her on Restasis.   Orders placed this encounter:  Orders Placed This Encounter  Procedures   CT CHEST [facility], [facility] [phone]

## 2022-01-12 NOTE — Progress Notes (Signed)
Patient presents today for chemotherapy infusion.  Patient is in satisfactory condition with no complaints voiced.  Vital signs are stable.  Labs reviewed by Dr. Delton Coombes during her office visit.  All labs are within treatment parameters.  We will proceed with treatment per MD orders.  Patient tolerated treatment well with no complaints voiced.  Patient left via wheelchair in stable condition.  Vital signs stable at discharge.  Follow up as scheduled.

## 2022-01-13 ENCOUNTER — Ambulatory Visit: Payer: Medicare HMO

## 2022-01-18 ENCOUNTER — Other Ambulatory Visit: Payer: Self-pay | Admitting: Cardiology

## 2022-01-18 ENCOUNTER — Other Ambulatory Visit: Payer: Self-pay | Admitting: Family Medicine

## 2022-01-18 NOTE — Telephone Encounter (Signed)
Unable to refill per protocol, last refill by another provider. Provider not at this practice, will refuse.  Requested Prescriptions  Pending Prescriptions Disp Refills  . fluticasone (FLONASE) 50 MCG/ACT nasal spray [Pharmacy Med Name: FLUTICASONE PROP 50 MCG SPRAY] 48 mL     Sig: PLACE 1 SPRAY INTO BOTH NOSTRILS 2 (TWO) TIMES DAILY     There is no refill protocol information for this order

## 2022-01-31 ENCOUNTER — Ambulatory Visit (HOSPITAL_COMMUNITY)
Admission: RE | Admit: 2022-01-31 | Discharge: 2022-01-31 | Disposition: A | Discharge status: Home / Self Care | Payer: Medicare HMO | Source: Ambulatory Visit | Attending: Hematology | Admitting: Hematology

## 2022-01-31 DIAGNOSIS — C3492 Malignant neoplasm of unspecified part of left bronchus or lung: Secondary | ICD-10-CM | POA: Diagnosis not present

## 2022-01-31 DIAGNOSIS — J479 Bronchiectasis, uncomplicated: Secondary | ICD-10-CM | POA: Diagnosis not present

## 2022-01-31 DIAGNOSIS — K7689 Other specified diseases of liver: Secondary | ICD-10-CM | POA: Diagnosis not present

## 2022-01-31 DIAGNOSIS — K5730 Diverticulosis of large intestine without perforation or abscess without bleeding: Secondary | ICD-10-CM | POA: Diagnosis not present

## 2022-01-31 MED ORDER — IOHEXOL 300 MG/ML  SOLN
100.0000 mL | Freq: Once | INTRAMUSCULAR | Status: AC | PRN
  Administered 2022-01-31: 100 mL via INTRAVENOUS

## 2022-01-31 MED ORDER — HEPARIN SOD (PORK) LOCK FLUSH 100 UNIT/ML IV SOLN
500.0000 [IU] | Freq: Once | INTRAVENOUS | Status: AC
  Administered 2022-01-31: 500 [IU] via INTRAVENOUS

## 2022-02-02 ENCOUNTER — Inpatient Hospital Stay: Payer: Medicare HMO

## 2022-02-02 ENCOUNTER — Inpatient Hospital Stay: Payer: Medicare HMO | Attending: Neurosurgery

## 2022-02-02 ENCOUNTER — Inpatient Hospital Stay (HOSPITAL_BASED_OUTPATIENT_CLINIC_OR_DEPARTMENT_OTHER): Payer: Medicare HMO | Admitting: Hematology

## 2022-02-02 VITALS — BP 126/69 | HR 100 | Temp 97.2°F | Resp 20

## 2022-02-02 VITALS — BP 139/72 | HR 73 | Temp 97.5°F | Resp 20 | Wt 244.9 lb

## 2022-02-02 DIAGNOSIS — Z5112 Encounter for antineoplastic immunotherapy: Secondary | ICD-10-CM | POA: Insufficient documentation

## 2022-02-02 DIAGNOSIS — Z7901 Long term (current) use of anticoagulants: Secondary | ICD-10-CM | POA: Insufficient documentation

## 2022-02-02 DIAGNOSIS — D320 Benign neoplasm of cerebral meninges: Secondary | ICD-10-CM | POA: Insufficient documentation

## 2022-02-02 DIAGNOSIS — C3492 Malignant neoplasm of unspecified part of left bronchus or lung: Secondary | ICD-10-CM | POA: Insufficient documentation

## 2022-02-02 DIAGNOSIS — I2699 Other pulmonary embolism without acute cor pulmonale: Secondary | ICD-10-CM | POA: Diagnosis not present

## 2022-02-02 DIAGNOSIS — C787 Secondary malignant neoplasm of liver and intrahepatic bile duct: Secondary | ICD-10-CM | POA: Diagnosis not present

## 2022-02-02 DIAGNOSIS — Z5111 Encounter for antineoplastic chemotherapy: Secondary | ICD-10-CM | POA: Diagnosis not present

## 2022-02-02 DIAGNOSIS — R809 Proteinuria, unspecified: Secondary | ICD-10-CM

## 2022-02-02 DIAGNOSIS — C3490 Malignant neoplasm of unspecified part of unspecified bronchus or lung: Secondary | ICD-10-CM

## 2022-02-02 DIAGNOSIS — Z95828 Presence of other vascular implants and grafts: Secondary | ICD-10-CM

## 2022-02-02 LAB — COMPREHENSIVE METABOLIC PANEL
ALT: 22 U/L (ref 0–44)
AST: 23 U/L (ref 15–41)
Albumin: 3.2 g/dL — ABNORMAL LOW (ref 3.5–5.0)
Alkaline Phosphatase: 74 U/L (ref 38–126)
Anion gap: 8 (ref 5–15)
BUN: 7 mg/dL — ABNORMAL LOW (ref 8–23)
CO2: 26 mmol/L (ref 22–32)
Calcium: 8.6 mg/dL — ABNORMAL LOW (ref 8.9–10.3)
Chloride: 108 mmol/L (ref 98–111)
Creatinine, Ser: 0.84 mg/dL (ref 0.44–1.00)
GFR, Estimated: >60 mL/min (ref >60)
Glucose, Bld: 99 mg/dL (ref 70–99)
Potassium: 3.7 mmol/L (ref 3.5–5.1)
Sodium: 142 mmol/L (ref 135–145)
Total Bilirubin: 0.5 mg/dL (ref 0.3–1.2)
Total Protein: 6.2 g/dL — ABNORMAL LOW (ref 6.5–8.1)

## 2022-02-02 LAB — CBC WITH DIFFERENTIAL/PLATELET
Abs Immature Granulocytes: 0.01 10*3/uL (ref 0.00–0.07)
Basophils Absolute: 0 10*3/uL (ref 0.0–0.1)
Basophils Relative: 1 %
Eosinophils Absolute: 0.1 10*3/uL (ref 0.0–0.5)
Eosinophils Relative: 1 %
HCT: 31.8 % — ABNORMAL LOW (ref 36.0–46.0)
Hemoglobin: 9.9 g/dL — ABNORMAL LOW (ref 12.0–15.0)
Immature Granulocytes: 0 %
Lymphocytes Relative: 16 %
Lymphs Abs: 0.6 10*3/uL — ABNORMAL LOW (ref 0.7–4.0)
MCH: 31.2 pg (ref 26.0–34.0)
MCHC: 31.1 g/dL (ref 30.0–36.0)
MCV: 100.3 fL — ABNORMAL HIGH (ref 80.0–100.0)
Monocytes Absolute: 0.4 10*3/uL (ref 0.1–1.0)
Monocytes Relative: 11 %
Neutro Abs: 2.5 10*3/uL (ref 1.7–7.7)
Neutrophils Relative %: 71 %
Platelets: 332 10*3/uL (ref 150–400)
RBC: 3.17 MIL/uL — ABNORMAL LOW (ref 3.87–5.11)
RDW: 15.1 % (ref 11.5–15.5)
WBC: 3.6 10*3/uL — ABNORMAL LOW (ref 4.0–10.5)
nRBC: 0 % (ref 0.0–0.2)

## 2022-02-02 LAB — URINALYSIS, DIPSTICK ONLY
Bilirubin Urine: NEGATIVE
Glucose, UA: NEGATIVE mg/dL
Hgb urine dipstick: NEGATIVE
Ketones, ur: NEGATIVE mg/dL
Nitrite: NEGATIVE
Protein, ur: NEGATIVE mg/dL
Specific Gravity, Urine: 1.02 (ref 1.005–1.030)
pH: 6 (ref 5.0–8.0)

## 2022-02-02 LAB — MAGNESIUM: Magnesium: 1.7 mg/dL (ref 1.7–2.4)

## 2022-02-02 MED ORDER — SODIUM CHLORIDE 0.9% FLUSH
10.0000 mL | INTRAVENOUS | Status: DC | PRN
  Administered 2022-02-02: 10 mL

## 2022-02-02 MED ORDER — SODIUM CHLORIDE 0.9 % IV SOLN: 8.0000 mg | Freq: Once | INTRAVENOUS | Status: DC

## 2022-02-02 MED ORDER — ONDANSETRON HCL 4 MG/2ML IJ SOLN
8.0000 mg | Freq: Once | INTRAMUSCULAR | Status: AC
  Administered 2022-02-02: 8 mg via INTRAVENOUS
  Filled 2022-02-02: qty 4

## 2022-02-02 MED ORDER — SODIUM CHLORIDE 0.9 % IV SOLN
500.0000 mg/m2 | Freq: Once | INTRAVENOUS | Status: AC
  Administered 2022-02-02: 1100 mg via INTRAVENOUS
  Filled 2022-02-02: qty 40

## 2022-02-02 MED ORDER — SODIUM CHLORIDE 0.9% FLUSH
10.0000 mL | Freq: Once | INTRAVENOUS | Status: AC
  Administered 2022-02-02: 10 mL via INTRAVENOUS

## 2022-02-02 MED ORDER — SODIUM CHLORIDE 0.9 % IV SOLN: Freq: Once | INTRAVENOUS | Status: AC

## 2022-02-02 MED ORDER — SODIUM CHLORIDE 0.9 % IV SOLN
200.0000 mg | Freq: Once | INTRAVENOUS | Status: AC
  Administered 2022-02-02: 200 mg via INTRAVENOUS
  Filled 2022-02-02: qty 8

## 2022-02-02 MED ORDER — HEPARIN SOD (PORK) LOCK FLUSH 100 UNIT/ML IV SOLN
500.0000 [IU] | Freq: Once | INTRAVENOUS | Status: AC | PRN
  Administered 2022-02-02: 500 [IU]

## 2022-02-02 NOTE — Progress Notes (Signed)
[name], [place] [address]   CLINIC:  Medical Oncology/Hematology  PCP:  [name], [address] / [place] [address] [phone]   REASON FOR VISIT:  Follow-up for stage IV adenocarcinoma of the lung, negative for targetable mutations  PRIOR THERAPY:  1. Chemoradiation with carboplatin and paclitaxel from 01/07/2019 to 02/11/2019. 2. Consolidation with durvalumab from 03/19/2019 to 04/16/2019, held due to pneumonitis.  NGS Results: not done  CURRENT THERAPY: Consolidation with durvalumab every 2 weeks; brain mets SRS in 5 fractions  BRIEF ONCOLOGIC HISTORY:  Oncology History  Adenocarcinoma of left lung ([name])  12/09/2018 Initial Diagnosis   Adenocarcinoma of left lung ([facility])   01/02/2019 Cancer Staging   Staging form: Lung, AJCC 8th Edition - Clinical stage from 01/02/2019: Stage IIIB (cT3, cN2, cM0) - Signed by Denise Jack, MD on 01/02/2019   01/07/2019 - 02/11/2019 Chemotherapy   The patient had palonosetron (ALOXI) injection 0.25 mg, 0.25 mg, Intravenous,  Once, 6 of 6 cycles Administration: 0.25 mg (01/07/2019), 0.25 mg (01/14/2019), 0.25 mg (01/21/2019), 0.25 mg (01/28/2019), 0.25 mg (02/04/2019), 0.25 mg (02/11/2019) CARBOplatin (PARAPLATIN) 270 mg in sodium chloride 0.9 % 250 mL chemo infusion, 270 mg (100 % of original dose 266.4 mg), Intravenous,  Once, 6 of 6 cycles Dose modification:   (original dose 266.4 mg, Cycle 1),   (original dose 266.4 mg, Cycle 2),   (original dose 266.4 mg, Cycle 3),   (original dose 266.4 mg, Cycle 4) Administration: 270 mg (01/07/2019), 270 mg (01/14/2019), 270 mg (01/21/2019), 270 mg (01/28/2019), 270 mg (02/04/2019), 270 mg (02/11/2019) PACLitaxel (TAXOL) 108 mg in sodium chloride 0.9 % 250 mL chemo infusion (</= 80m/m2), 45 mg/m2 = 108 mg, Intravenous,  Once, 6 of 6 cycles Administration: 108 mg (01/07/2019), 108 mg (01/14/2019), 108 mg (01/21/2019), 108 mg (01/28/2019), 108  mg (02/04/2019), 108 mg (02/11/2019) fosaprepitant (EMEND) 150 mg, dexamethasone (DECADRON) 12 mg in sodium chloride 0.9 % 145 mL IVPB, , Intravenous,  Once, 5 of 5 cycles Administration:  (01/14/2019),  (01/21/2019),  (01/28/2019),  (02/04/2019),  (02/11/2019)  for chemotherapy treatment.    03/19/2019 - 07/23/2020 Chemotherapy   Patient is on Treatment Plan : LUNG DURVALUMAB [id number]    09/22/2021 - 11/04/2021 Chemotherapy   Patient is on Treatment Plan : LUNG Carboplatin (5) + Pemetrexed (500) + Pembrolizumab (200) D1 q21d Induction x 4 cycles / Maintenance Pemetrexed (500) + Pembrolizumab (200) D1 q21d     09/22/2021 -  Chemotherapy   Patient is on Treatment Plan : LUNG Carboplatin (5) + Pemetrexed (500) + Pembrolizumab (200) D1 q21d Induction x 4 cycles / Maintenance Pemetrexed (500) + Pembrolizumab (200) D1 q21d       CANCER STAGING:  Cancer Staging  Adenocarcinoma of left lung ([facility] Staging form: Lung, AJCC 8th Edition - Clinical stage from 01/02/2019: Stage IIIB (cT3, cN2, cM0) - Signed by KDerek Jack MD on 01/02/2019 - Pathologic stage from 09/07/2021: Stage IVA (pTX, pNX, pM1b) - Unsigned   INTERVAL HISTORY:  [name] a 71y.o. female, is seen today for follow-up of metastatic lung cancer.  After each treatment she reports 5 days of feeling tired, nauseous and occasional dizziness.  She is continuing to take folic acid daily.  She denies any rash or diarrhea.  She continues to have tearing of eyes.   REVIEW OF SYSTEMS:  Review of Systems  Eyes:  Positive for eye problems (Dryness and tearing).  Respiratory:  Positive  for shortness of breath (On exertion).   Musculoskeletal:  Positive for arthralgias (7/10 R knee).  Neurological:  Positive for dizziness.  All other systems reviewed and are negative.   PAST MEDICAL/SURGICAL HISTORY:  Past Medical History:  Diagnosis Date   Anemia    Aortic stenosis    Arthritis    Coronary artery calcification seen on CT scan     Essential hypertension    GERD (gastroesophageal reflux disease)    History of lung cancer    Stage III adenocarcinoma status post chemoradiation   Port-A-Cath in place 01/06/2019   Type 2 diabetes mellitus ([name])    Past Surgical History:  Procedure Laterality Date   CHOLECYSTECTOMY  1997   COLONOSCOPY     GASTRIC BYPASS     INCISIONAL HERNIA REPAIR  04/11/11   IR IMAGING GUIDED PORT INSERTION  12/27/2018   Right   IR US GUIDE BX ASP/DRAIN  08/08/2021   LAPAROSCOPIC SALPINGOOPHERECTOMY     LAPAROTOMY  04/11/2011   Procedure: EXPLORATORY LAPAROTOMY;  Surgeon: Denise Faster. Cornett, MD;  Location: [name];  Service: General;  Laterality: N/A;  closure port hole   RIGHT/LEFT HEART CATH AND CORONARY ANGIOGRAPHY N/A 12/29/2019   Procedure: RIGHT/LEFT HEART CATH AND CORONARY ANGIOGRAPHY;  Surgeon: Denise Sine, MD;  Location: [name];  Service: Cardiovascular;  Laterality: N/A;   TOTAL HIP ARTHROPLASTY  03/07/2012   Procedure: TOTAL HIP ARTHROPLASTY ANTERIOR APPROACH;  Surgeon: Denise Pole, MD;  Location: [name];  Service: Orthopedics;  Laterality: Right;   TOTAL SHOULDER ARTHROPLASTY Left 01/21/2015   TOTAL SHOULDER ARTHROPLASTY Left 01/21/2015   Procedure: LEFT TOTAL SHOULDER ARTHROPLASTY;  Surgeon: Denise Britain, MD;  Location: [name];  Service: Orthopedics;  Laterality: Left;   VAGINAL HYSTERECTOMY      SOCIAL HISTORY:  Social History   Socioeconomic History   Marital status: Single    Spouse name: Not on file   Number of children: Not on file   Years of education: 12th grade   Highest education level: Not on file  Occupational History   Occupation: Employed    Employer: [name]  Tobacco Use   Smoking status: Former    Packs/day: 1.00    Years: 12.00    Total pack years: 12.00    Types: Cigarettes    Quit date: 03/20/1976    Years since quitting: 45.9   Smokeless tobacco: Never  Vaping Use   Vaping Use: Never used  Substance and Sexual Activity    Alcohol use: No   Drug use: No   Sexual activity: Not Currently  Other Topics Concern   Not on file  Social History Narrative   Not on file   Social Determinants of Health   Financial Resource Strain: Low Risk  (12/06/2018)   Overall Financial Resource Strain (CARDIA)    Difficulty of Paying Living Expenses: Not very hard  Food Insecurity: No Food Insecurity (12/06/2018)   Hunger Vital Sign    Worried About Running Out of Food in the Last Year: Never true    Ran Out of Food in the Last Year: Never true  Transportation Needs: No Transportation Needs (12/06/2018)   PRAPARE - Hydrologist (Medical): No    Lack of Transportation (Non-Medical): No  Physical Activity: Inactive (12/06/2018)   Exercise Vital Sign    Days of Exercise per Week: 0 days    Minutes of Exercise per Session: 0 min  Stress: No Stress  Concern Present (12/06/2018)   [place]    Feeling of Stress : Only a little  Social Connections: Moderately Isolated (12/06/2018)   Social Connection and Isolation Panel [NHANES]    Frequency of Communication with Friends and Family: More than three times a week    Frequency of Social Gatherings with Friends and Family: Once a week    Attends Religious Services: More than 4 times per year    Active Member of [employer] or Organizations: No    Attends Archivist Meetings: Never    Marital Status: Never married  Intimate Partner Violence: Not At Risk (12/06/2018)   Humiliation, Afraid, Rape, and Kick questionnaire    Fear of Current or Ex-Partner: No    Emotionally Abused: No    Physically Abused: No    Sexually Abused: No    FAMILY HISTORY:  Family History  Problem Relation Age of Onset   Breast cancer Mother    COPD Mother    Arthritis Mother    Diabetes Mother    Hypertension Mother    Hypertension Father    Diabetes Father    Breast cancer Sister    Thyroid cancer  Brother    Huntington's disease Maternal Grandmother    Heart attack Brother     CURRENT MEDICATIONS:  Current Outpatient Medications  Medication Sig Dispense Refill   albuterol (VENTOLIN HFA) 108 (90 Base) MCG/ACT inhaler TAKE 2 PUFFS BY MOUTH EVERY 6 HOURS AS NEEDED FOR WHEEZE OR SHORTNESS OF BREATH (Patient taking differently: Inhale 2 puffs into the lungs every 6 (six) hours as needed for wheezing or shortness of breath.) 18 each 6   apixaban (ELIQUIS) 5 MG TABS tablet Take 1 tablet (5 mg total) by mouth 2 (two) times daily. 60 tablet 6   benzonatate (TESSALON) 100 MG capsule Take 1 capsule (100 mg total) by mouth 3 (three) times daily as needed for cough. 21 capsule 0   Calcium Carbonate Antacid (TUMS PO) Take 4 tablets by mouth daily.     carvedilol (COREG) 3.125 MG tablet TAKE 1 TABLET BY MOUTH 2 TIMES DAILY. 180 tablet 1   Cyanocobalamin (VITAMIN B-12) 2500 MCG SUBL Place 2,500 mcg under the tongue every morning.      cycloSPORINE (RESTASIS) 0.05 % ophthalmic emulsion Place 1 drop into both eyes 2 (two) times daily. 60 each 3   diphenhydrAMINE (BENADRYL) 25 MG tablet Take 25 mg by mouth every 6 (six) hours as needed for itching or allergies.     ferrous sulfate 325 (65 FE) MG tablet Take 325 mg by mouth every evening.     fluticasone (FLONASE) 50 MCG/ACT nasal spray Place 1 spray into both nostrils 2 (two) times daily. 16 g 2   folic acid (FOLVITE) 1 MG tablet TAKE 1 TABLET BY MOUTH EVERY DAY 90 tablet 1   furosemide (LASIX) 20 MG tablet Take 20 mg by mouth as needed.     gabapentin (NEURONTIN) 300 MG capsule Take 300 mg by mouth at bedtime as needed (Nerve Pain).     Multiple Vitamin (MULITIVITAMIN WITH MINERALS) TABS Take 1 tablet by mouth daily.     omeprazole (PRILOSEC) 20 MG capsule Take 20 mg by mouth daily.     Potassium Chloride ER 20 MEQ TBCR Take 1 tablet by mouth daily.     prochlorperazine (COMPAZINE) 10 MG tablet Take 1 tablet (10 mg total) by mouth every 6 (six) hours as  needed for nausea or  vomiting. 30 tablet 3   No current facility-administered medications for this visit.   Facility-Administered Medications Ordered in Other Visits  Medication Dose Route Frequency Provider Last Rate Last Admin   magnesium sulfate 2 GM/50ML IVPB             ALLERGIES:  No Known Allergies  PHYSICAL EXAM:  Performance status (ECOG): 1 - Symptomatic but completely ambulatory  There were no vitals filed for this visit.  Wt Readings from Last 3 Encounters:  01/12/22 244 lb 0.8 oz (110.7 kg)  12/15/21 242 lb 8.1 oz (110 kg)  11/24/21 241 lb 13.5 oz (109.7 kg)   Physical Exam Vitals reviewed.  Constitutional:      Appearance: Normal appearance.     Comments: In wheelchair  Cardiovascular:     Rate and Rhythm: Normal rate and regular rhythm.     Pulses: Normal pulses.     Heart sounds: Murmur heard.     Systolic murmur is present.  Pulmonary:     Effort: Pulmonary effort is normal.     Breath sounds: Normal breath sounds.  Musculoskeletal:     Right lower leg: No edema.     Left lower leg: No edema.  Neurological:     General: No focal deficit present.     Mental Status: She is alert and oriented to person, place, and time.  Psychiatric:        Mood and Affect: Mood normal.        Behavior: Behavior normal.     LABORATORY DATA:  I have reviewed the labs as listed.     Latest Ref Rng & Units 01/12/2022    8:33 AM 12/15/2021    8:00 AM 11/24/2021    9:05 AM  CBC  WBC 4.0 - 10.5 K/uL 6.1  5.3  5.4   Hemoglobin 12.0 - 15.0 g/dL 9.9  9.7  10.4   Hematocrit 36.0 - 46.0 % 32.2  30.4  32.3   Platelets 150 - 400 K/uL 303  210  237       Latest Ref Rng & Units 01/12/2022    8:33 AM 12/15/2021    8:00 AM 11/24/2021    9:05 AM  CMP  Glucose 70 - 99 mg/dL 100  115  99   BUN 8 - 23 mg/dL _0 Creatinine 0.44 - 1.00 mg/dL 0.90  0.76  0.84   Sodium 135 - 145 mmol/L 141  142  141   Potassium 3.5 - 5.1 mmol/L 3.3  3.2  3.4   Chloride 98 - 111 mmol/L  110  108  109   CO2 22 - 32 mmol/L _1 Calcium 8.9 - 10.3 mg/dL 8.3  8.6  8.5   Total Protein 6.5 - 8.1 g/dL 6.2  6.0  6.3   Total Bilirubin 0.3 - 1.2 mg/dL 0.5  0.5  0.5   Alkaline Phos 38 - 126 U/L 74  85  74   AST 15 - 41 U/L _2 ALT 0 - 44 U/L _3 DIAGNOSTIC IMAGING:  I have independently reviewed the scans and discussed with the patient. CT CHEST ABDOMEN PELVIS W CONTRAST  Result Date: 02/01/2022 CLINICAL DATA:  71 year old female with history of non-small cell lung cancer. Restaging examination. EXAM: CT CHEST, ABDOMEN, AND PELVIS WITH CONTRAST TECHNIQUE: Multidetector CT imaging of the chest, abdomen and pelvis was  performed following the standard protocol during bolus administration of intravenous contrast. RADIATION DOSE REDUCTION: This exam was performed according to the departmental dose-optimization program which includes automated exposure control, adjustment of the mA and/or kV according to patient size and/or use of iterative reconstruction technique. CONTRAST:  163m OMNIPAQUE IOHEXOL 300 MG/ML  SOLN COMPARISON:  CT of the chest, abdomen and pelvis 11/10/2021. FINDINGS: CT CHEST FINDINGS Cardiovascular: Heart size is normal. There is no significant pericardial fluid, thickening or pericardial calcification. Atherosclerosis in the thoracic aorta. No definite coronary artery calcifications. Calcifications of the aortic valve and mitral annulus. Right internal jugular single-lumen porta cath with tip terminating in the superior aspect of the right atrium. Mediastinum/Nodes: No pathologically enlarged mediastinal or hilar lymph nodes. Esophagus is unremarkable in appearance. No axillary lymphadenopathy. Lungs/Pleura: Chronic areas of traction bronchiectasis, volume loss and mass-like architectural distortion are again noted in the left mid to upper lung, compatible with areas of chronic postradiation mass-like fibrosis. The contours in this region are stable  compared to the prior examination without definitive evidence to suggest locally recurrent disease. Nodule in the periphery of the right upper lobe adjacent to the minor fissure (axial image 53 of series 3) measures 1.5 x 1.3 cm on today's examination, previously 2.0 x 1.3 cm. No other new suspicious appearing pulmonary nodules or masses are noted. No confluent consolidative airspace disease. No pleural effusions. Musculoskeletal: 1.2 cm lucent lesion with vertical trabeculations in the left side of the T8 vertebral body, unchanged, compatible with a small cavernous hemangioma. There are no aggressive appearing lytic or blastic lesions noted in the visualized portions of the skeleton. Status post left shoulder arthroplasty. CT ABDOMEN PELVIS FINDINGS Hepatobiliary: Again noted is a large mass or 2 adjacent masses in the right lobe of the liver. When measured on axial image 52 of series 2 from the outside edge of the peripheral rim of enhancement (rather than the centrally necrotic region) the superior lesion measures 4.1 x 4.1 cm, while the inferior lesion (also measured from the edges of the peripherally enhancing portion of the lesion) is estimated to measure approximately 4.4 x 3.3 cm (axial image 57 of series 2). No definite new hepatic lesions are otherwise noted. Minimal intrahepatic biliary ductal dilatation, stable. Status post cholecystectomy. Common bile duct remains dilated measuring 13 mm in the porta hepatis, similar to prior studies, likely reflective of benign post cholecystectomy physiology. No calcified stones identified in the common bile duct to suggest choledocholithiasis. Pancreas: No pancreatic mass. No pancreatic ductal dilatation. No pancreatic or peripancreatic fluid collections or inflammatory changes. Spleen: Unremarkable. Adrenals/Urinary Tract: Bilateral kidneys and bilateral adrenal glands are normal in appearance. No hydroureteronephrosis. Urinary bladder is nearly decompressed and  partially obscured by beam hardening artifact from the patient's right hip arthroplasty, but unremarkable in appearance. Stomach/Bowel: Status post Roux-en-Y gastric bypass. No pathologic dilatation of small bowel or colon. Numerous colonic diverticuli are noted, most evident in the descending colon and sigmoid colon, without surrounding inflammatory changes to suggest an acute diverticulitis at this time. Normal appendix. Vascular/Lymphatic: Aortic atherosclerosis, without evidence of aneurysm or dissection in the abdominal or pelvic vasculature. No lymphadenopathy noted in the abdomen or pelvis. Reproductive: Large densely calcified lesion in the right-side of the uterine fundus, similar to prior studies, presumably a calcified fibroid, measuring up to 4.7 x 3.7 cm. Ovaries are atrophic. Other: No significant volume of ascites. Large ventral hernia containing multiple loops of small bowel. No pneumoperitoneum. Musculoskeletal: There are no aggressive appearing lytic or blastic  lesions noted in the visualized portions of the skeleton. Status post right hip arthroplasty. IMPRESSION: 1. Stable changes of postradiation mass-like fibrosis in the left lung, without definitive evidence to suggest residual or locally recurrent disease. 2. Previously noted right upper lobe pulmonary nodule which was new on the prior examination is slightly smaller and more solid in appearance on today's study, currently measuring 1.5 x 1.3 cm (previously 2.0 x 1.3 cm). This could reflect a resolving infectious or inflammatory nodule, or if the patient is still undergoing treatment, this could reflect a resolving metastatic lesion responding to chemotherapy. Close attention on follow-up studies is recommended. 3. Allowing for differences in perfusion timing between today's examination and prior studies, the metastatic lesions in the right lobe of the liver appear relatively similar to the prior examination, as detailed above. 4. Aortic  atherosclerosis. 5. Colonic diverticulosis without evidence of acute diverticulitis at this time. 6. Fibroid uterus. 7. Additional incidental findings, as above. Electronically Signed   By: Vinnie Langton M.D.   On: 02/01/2022 10:13     ASSESSMENT:  1.  Adenocarcinoma of left lung ([facility]) -Chemoradiation therapy with carboplatin and paclitaxel from 01/07/2019 through 02/11/2019. -Consolidation immunotherapy with durvalumab from 03/19/2019 through 04/15/2018, held due to pneumonitis. -CT chest on 07/02/2019 shows left upper lobe lung mass measuring 2.6 x 2.0 cm.  It shows improvement in size. -MRI of the brain on 07/18/2019 showed left sphenoid wing meningioma measuring 2.6 x 2.0 x 2.6 cm unchanged.  No new enhancing intracranial lesion.  Increased left temporal white matter edema. -Durvalumab restarted on 07/24/2019. -She was evaluated by cardiology with a stress test.  EF was 46%.  She underwent cardiac catheterization which did not show any abnormalities. - 1 year of durvalumab completed on 07/23/2020. - Liver mass biopsy (08/08/2021): Adenocarcinoma, CK7 positive, negative for TTF-1, Napsin a, GATA3, ER, CK20, CDX2 - PET scan (08/25/2021): Right upper lobe lung nodule 9 mm, SUV 5.0.  Groundglass and solid nodule periphery of the right upper lobe 8 mm, SUV 2.45.  Right lobe liver mass 4.5 x 3.6 cm, SUV 7.78. - NGS testing: PD-L1 negative, TMB-low, MSI-stable, K-ras G12 D.  No other targetable mutations. - Carboplatin, pemetrexed and pembrolizumab 4 cycles from 09/22/2021 through 11/25/2021, followed by maintenance pemetrexed and pembrolizumab -  CT CAP (11/10/2021): Reduced size of liver mass and interval resolution of previously seen groundglass pulmonary nodules.  There is a new inflammatory right upper lobe nodule.  PLAN:  1.  Metastatic adenocarcinoma of the lung to the liver and right lung: - CT CAP (01/31/2022): Stable postradiation like fibrosis in the left lung.  Previously noted RUL nodule is smaller  indicating infectious/inflammatory etiology.  Metastatic lesions in the right lobe of the liver appear relatively similar to prior exam. - Labs today shows normal LFTs and creatinine.  CBC was grossly stable with anemia and mild leukopenia.  Last TSH was 1.2. - She will proceed with pemetrexed and pembrolizumab today.  I discussed that she will continue treatments until progression or intolerance. - She is having 5 days of tiredness, dizziness, nausea after each treatment.  After her next scan in 3 months, we may discontinue pemetrexed if she continues to have problems.   2.  Meningioma: - MRI of the brain on 09/27/2021 with stable meningioma with no new lesions.  She will have follow-up MRI in January.  3.  Unprovoked pulmonary embolism: - Continue Eliquis 5 mg twice daily indefinitely.  4.  Leg swellings: - Continue Lasix 20 mg  daily as needed.  5.  Dry eyes with itching: - We have ordered Restasis at last visit which has high co-pay. - I have recommended her to use refresh eyedrops.   Orders placed this encounter:  No orders of the defined types were placed in this encounter.     Denise Jack, MD [place] [phone]

## 2022-02-02 NOTE — Progress Notes (Signed)
Patient has been examined by Dr. Katragadda, and vital signs and labs have been reviewed. ANC, Creatinine, LFTs, hemoglobin, and platelets are within treatment parameters per M.D. - pt may proceed with treatment.  Primary RN and pharmacy notified.  

## 2022-02-02 NOTE — Patient Instructions (Signed)
[facility]  Discharge Instructions: Thank you for choosing [facility] to provide your oncology and hematology care.  If you have a lab appointment with the [facility], please come in thru the Main Entrance and check in at the main information desk.  Wear comfortable clothing and clothing appropriate for easy access to any Portacath or PICC line.   We strive to give you quality time with your provider. You may need to reschedule your appointment if you arrive late (15 or more minutes).  Arriving late affects you and other patients whose appointments are after yours.  Also, if you miss three or more appointments without notifying the office, you may be dismissed from the clinic at the provider's discretion.      For prescription refill requests, have your pharmacy contact our office and allow 72 hours for refills to be completed.    Today you received the following chemotherapy and/or immunotherapy agents keytruda/ alimta      To help prevent nausea and vomiting after your treatment, we encourage you to take your nausea medication as directed.  BELOW ARE SYMPTOMS THAT SHOULD BE REPORTED IMMEDIATELY: *FEVER GREATER THAN 100.4 F (38 C) OR HIGHER *CHILLS OR SWEATING *NAUSEA AND VOMITING THAT IS NOT CONTROLLED WITH YOUR NAUSEA MEDICATION *UNUSUAL SHORTNESS OF BREATH *UNUSUAL BRUISING OR BLEEDING *URINARY PROBLEMS (pain or burning when urinating, or frequent urination) *BOWEL PROBLEMS (unusual diarrhea, constipation, pain near the anus) TENDERNESS IN MOUTH AND THROAT WITH OR WITHOUT PRESENCE OF ULCERS (sore throat, sores in mouth, or a toothache) UNUSUAL RASH, SWELLING OR PAIN  UNUSUAL VAGINAL DISCHARGE OR ITCHING   Items with * indicate a potential emergency and should be followed up as soon as possible or go to the Emergency Department if any problems should occur.  Please show the CHEMOTHERAPY ALERT CARD or IMMUNOTHERAPY ALERT CARD at check-in to the  Emergency Department and triage nurse.  Should you have questions after your visit or need to cancel or reschedule your appointment, please contact [facility] [phone]  and follow the prompts.  Office hours are 8:00 a.m. to 4:30 p.m. Monday - Friday. Please note that voicemails left after 4:00 p.m. may not be returned until the following business day.  We are closed weekends and major holidays. You have access to a nurse at all times for urgent questions. Please call the main number to the clinic [phone] and follow the prompts.  For any non-urgent questions, you may also contact your provider using MyChart. We now offer e-Visits for anyone 69 and older to request care online for non-urgent symptoms. For details visit [email or web address].   Also download the MyChart app! Go to the app store, search "MyChart", open the app, select [facility], and log in with your MyChart username and password.  Masks are optional in the cancer centers. If you would like for your care team to wear a mask while they are taking care of you, please let them know. You may have one support person who is at least 70 years old accompany you for your appointments.

## 2022-02-02 NOTE — Progress Notes (Signed)
Patient presents today for Keytruda/Alimta infusion per providers order.  Vital signs and labs within parameters for treatment.  Messages received from Denise Champion RN/Dr. Delton Coombes patient okay for treatment.  Treatment given today per MD orders.  Tolerated infusion without adverse affects.  Vital signs stable.  No complaints at this time.  Discharge from clinic via wheelchair in stable condition.  Alert and oriented X 3.  Follow up with [facility] as scheduled.

## 2022-02-02 NOTE — Patient Instructions (Addendum)
[place] at [facility] Discharge Instructions   You were seen and examined today by Dr. Delton Coombes.  He reviewed the results of your CT scan which are stable.   He reviewed the results of your lab work which are normal/stable.   We will proceed with your treatment today.   Return as scheduled.    Thank you for choosing [place] at [facility] to provide your oncology and hematology care.  To afford each patient quality time with our provider, please arrive at least 15 minutes before your scheduled appointment time.   If you have a lab appointment with the [facility] please come in thru the Main Entrance and check in at the main information desk.  You need to re-schedule your appointment should you arrive 10 or more minutes late.  We strive to give you quality time with our providers, and arriving late affects you and other patients whose appointments are after yours.  Also, if you no show three or more times for appointments you may be dismissed from the clinic at the providers discretion.     Again, thank you for choosing [facility].  Our hope is that these requests will decrease the amount of time that you wait before being seen by our physicians.       _____________________________________________________________  Should you have questions after your visit to [facility], please contact our office at [phone] and follow the prompts.  Our office hours are 8:00 a.m. and 4:30 p.m. Monday - Friday.  Please note that voicemails left after 4:00 p.m. may not be returned until the following business day.  We are closed weekends and major holidays.  You do have access to a nurse 24-7, just call the main number to the clinic [phone] and do not press any options, hold on the line and a nurse will answer the phone.    For prescription refill requests, have your pharmacy contact our office and allow 72 hours.     Due to Covid, you will need to wear a mask upon entering the hospital. If you do not have a mask, a mask will be given to you at the Main Entrance upon arrival. For doctor visits, patients may have 1 support person age 66 or older with them. For treatment visits, patients can not have anyone with them due to social distancing guidelines and our immunocompromised population.

## 2022-02-17 ENCOUNTER — Other Ambulatory Visit (HOSPITAL_COMMUNITY): Payer: Self-pay | Admitting: Family Medicine

## 2022-02-17 DIAGNOSIS — Z1231 Encounter for screening mammogram for malignant neoplasm of breast: Secondary | ICD-10-CM

## 2022-02-23 ENCOUNTER — Inpatient Hospital Stay: Payer: Medicare HMO | Attending: Neurosurgery

## 2022-02-23 ENCOUNTER — Ambulatory Visit: Payer: Medicare HMO | Admitting: Hematology

## 2022-02-23 ENCOUNTER — Inpatient Hospital Stay: Payer: Medicare HMO

## 2022-02-23 VITALS — BP 116/66 | HR 82 | Temp 98.7°F

## 2022-02-23 DIAGNOSIS — Z452 Encounter for adjustment and management of vascular access device: Secondary | ICD-10-CM | POA: Insufficient documentation

## 2022-02-23 DIAGNOSIS — C3492 Malignant neoplasm of unspecified part of left bronchus or lung: Secondary | ICD-10-CM | POA: Insufficient documentation

## 2022-02-23 DIAGNOSIS — Z5111 Encounter for antineoplastic chemotherapy: Secondary | ICD-10-CM | POA: Diagnosis not present

## 2022-02-23 DIAGNOSIS — Z79899 Other long term (current) drug therapy: Secondary | ICD-10-CM | POA: Insufficient documentation

## 2022-02-23 DIAGNOSIS — I2699 Other pulmonary embolism without acute cor pulmonale: Secondary | ICD-10-CM | POA: Diagnosis not present

## 2022-02-23 DIAGNOSIS — Z7901 Long term (current) use of anticoagulants: Secondary | ICD-10-CM | POA: Diagnosis not present

## 2022-02-23 DIAGNOSIS — Z5112 Encounter for antineoplastic immunotherapy: Secondary | ICD-10-CM | POA: Diagnosis not present

## 2022-02-23 DIAGNOSIS — C787 Secondary malignant neoplasm of liver and intrahepatic bile duct: Secondary | ICD-10-CM | POA: Insufficient documentation

## 2022-02-23 LAB — TSH: TSH: 1.479 u[IU]/mL (ref 0.350–4.500)

## 2022-02-23 LAB — CBC WITH DIFFERENTIAL/PLATELET
Abs Immature Granulocytes: 0.02 10*3/uL (ref 0.00–0.07)
Basophils Absolute: 0 10*3/uL (ref 0.0–0.1)
Basophils Relative: 0 %
Eosinophils Absolute: 0.1 10*3/uL (ref 0.0–0.5)
Eosinophils Relative: 1 %
HCT: 31.3 % — ABNORMAL LOW (ref 36.0–46.0)
Hemoglobin: 9.4 g/dL — ABNORMAL LOW (ref 12.0–15.0)
Immature Granulocytes: 0 %
Lymphocytes Relative: 12 %
Lymphs Abs: 0.6 10*3/uL — ABNORMAL LOW (ref 0.7–4.0)
MCH: 29.8 pg (ref 26.0–34.0)
MCHC: 30 g/dL (ref 30.0–36.0)
MCV: 99.4 fL (ref 80.0–100.0)
Monocytes Absolute: 0.5 10*3/uL (ref 0.1–1.0)
Monocytes Relative: 10 %
Neutro Abs: 3.8 10*3/uL (ref 1.7–7.7)
Neutrophils Relative %: 77 %
Platelets: 388 10*3/uL (ref 150–400)
RBC: 3.15 MIL/uL — ABNORMAL LOW (ref 3.87–5.11)
RDW: 13.8 % (ref 11.5–15.5)
WBC: 5 10*3/uL (ref 4.0–10.5)
nRBC: 0 % (ref 0.0–0.2)

## 2022-02-23 LAB — COMPREHENSIVE METABOLIC PANEL
ALT: 17 U/L (ref 0–44)
AST: 22 U/L (ref 15–41)
Albumin: 3 g/dL — ABNORMAL LOW (ref 3.5–5.0)
Alkaline Phosphatase: 82 U/L (ref 38–126)
Anion gap: 7 (ref 5–15)
BUN: 7 mg/dL — ABNORMAL LOW (ref 8–23)
CO2: 25 mmol/L (ref 22–32)
Calcium: 8.6 mg/dL — ABNORMAL LOW (ref 8.9–10.3)
Chloride: 107 mmol/L (ref 98–111)
Creatinine, Ser: 0.77 mg/dL (ref 0.44–1.00)
GFR, Estimated: >60 mL/min (ref >60)
Glucose, Bld: 106 mg/dL — ABNORMAL HIGH (ref 70–99)
Potassium: 3.5 mmol/L (ref 3.5–5.1)
Sodium: 139 mmol/L (ref 135–145)
Total Bilirubin: 0.5 mg/dL (ref 0.3–1.2)
Total Protein: 6.3 g/dL — ABNORMAL LOW (ref 6.5–8.1)

## 2022-02-23 LAB — MAGNESIUM: Magnesium: 1.9 mg/dL (ref 1.7–2.4)

## 2022-02-23 MED ORDER — HEPARIN SOD (PORK) LOCK FLUSH 100 UNIT/ML IV SOLN
500.0000 [IU] | Freq: Once | INTRAVENOUS | Status: AC | PRN
  Administered 2022-02-23: 500 [IU]

## 2022-02-23 MED ORDER — ONDANSETRON HCL 4 MG/2ML IJ SOLN
8.0000 mg | Freq: Once | INTRAMUSCULAR | Status: AC
  Administered 2022-02-23: 8 mg via INTRAMUSCULAR
  Filled 2022-02-23: qty 4

## 2022-02-23 MED ORDER — SODIUM CHLORIDE 0.9 % IV SOLN
200.0000 mg | Freq: Once | INTRAVENOUS | Status: AC
  Administered 2022-02-23: 200 mg via INTRAVENOUS
  Filled 2022-02-23: qty 8

## 2022-02-23 MED ORDER — SODIUM CHLORIDE 0.9 % IV SOLN
500.0000 mg/m2 | Freq: Once | INTRAVENOUS | Status: AC
  Administered 2022-02-23: 1100 mg via INTRAVENOUS
  Filled 2022-02-23: qty 4

## 2022-02-23 MED ORDER — SODIUM CHLORIDE 0.9 % IV SOLN: Freq: Once | INTRAVENOUS | Status: AC

## 2022-02-23 MED ORDER — SODIUM CHLORIDE 0.9% FLUSH
10.0000 mL | INTRAVENOUS | Status: DC | PRN
  Administered 2022-02-23: 10 mL

## 2022-02-23 NOTE — Progress Notes (Signed)
Patient presents today for Keytruda/Alimta infusions per providers order.  Vital signs and labs within parameters for treatment.  Patient has no new complaints at this time.  Treatment given today per MD orders.  Stable during infusion without adverse affects.  Vital signs stable.  No complaints at this time.  Discharge from clinic ambulatory in stable condition.  Alert and oriented X 3.  Follow up with [facility] as scheduled.

## 2022-02-23 NOTE — Progress Notes (Signed)
Patients port flushed without difficulty.  Good blood return noted with no bruising or swelling noted at site.  Stable during access and blood draw.  Patient to remain accessed for treatment. 

## 2022-02-23 NOTE — Patient Instructions (Signed)
[place]  Discharge Instructions: Thank you for choosing [facility] to provide your oncology and hematology care.  If you have a lab appointment with the [facility], please come in thru the Main Entrance and check in at the main information desk.  Wear comfortable clothing and clothing appropriate for easy access to any Portacath or PICC line.   We strive to give you quality time with your provider. You may need to reschedule your appointment if you arrive late (15 or more minutes).  Arriving late affects you and other patients whose appointments are after yours.  Also, if you miss three or more appointments without notifying the office, you may be dismissed from the clinic at the provider's discretion.      For prescription refill requests, have your pharmacy contact our office and allow 72 hours for refills to be completed.    Today you received the following chemotherapy and/or immunotherapy agents Keytruda/Alimta      To help prevent nausea and vomiting after your treatment, we encourage you to take your nausea medication as directed.  BELOW ARE SYMPTOMS THAT SHOULD BE REPORTED IMMEDIATELY: *FEVER GREATER THAN 100.4 F (38 C) OR HIGHER *CHILLS OR SWEATING *NAUSEA AND VOMITING THAT IS NOT CONTROLLED WITH YOUR NAUSEA MEDICATION *UNUSUAL SHORTNESS OF BREATH *UNUSUAL BRUISING OR BLEEDING *URINARY PROBLEMS (pain or burning when urinating, or frequent urination) *BOWEL PROBLEMS (unusual diarrhea, constipation, pain near the anus) TENDERNESS IN MOUTH AND THROAT WITH OR WITHOUT PRESENCE OF ULCERS (sore throat, sores in mouth, or a toothache) UNUSUAL RASH, SWELLING OR PAIN  UNUSUAL VAGINAL DISCHARGE OR ITCHING   Items with * indicate a potential emergency and should be followed up as soon as possible or go to the Emergency Department if any problems should occur.  Please show the CHEMOTHERAPY ALERT CARD or IMMUNOTHERAPY ALERT CARD at check-in to the  Emergency Department and triage nurse.  Should you have questions after your visit or need to cancel or reschedule your appointment, please contact [facility] [phone]  and follow the prompts.  Office hours are 8:00 a.m. to 4:30 p.m. Monday - Friday. Please note that voicemails left after 4:00 p.m. may not be returned until the following business day.  We are closed weekends and major holidays. You have access to a nurse at all times for urgent questions. Please call the main number to the clinic [phone] and follow the prompts.  For any non-urgent questions, you may also contact your provider using MyChart. We now offer e-Visits for anyone 25 and older to request care online for non-urgent symptoms. For details visit [email or web address].   Also download the MyChart app! Go to the app store, search "MyChart", open the app, select [facility], and log in with your MyChart username and password.  Masks are optional in the cancer centers. If you would like for your care team to wear a mask while they are taking care of you, please let them know. You may have one support person who is at least 70 years old accompany you for your appointments.

## 2022-03-01 ENCOUNTER — Ambulatory Visit (HOSPITAL_COMMUNITY)
Admission: RE | Admit: 2022-03-01 | Discharge: 2022-03-01 | Disposition: A | Discharge status: Home / Self Care | Payer: Medicare HMO | Source: Ambulatory Visit | Attending: Family Medicine | Admitting: Family Medicine

## 2022-03-01 DIAGNOSIS — Z1231 Encounter for screening mammogram for malignant neoplasm of breast: Secondary | ICD-10-CM | POA: Insufficient documentation

## 2022-03-03 ENCOUNTER — Other Ambulatory Visit: Payer: Self-pay | Admitting: Family Medicine

## 2022-03-03 NOTE — Telephone Encounter (Signed)
Requested Prescriptions  Refused Prescriptions Disp Refills   fluticasone (FLONASE) 50 MCG/ACT nasal spray [Pharmacy Med Name: FLUTICASONE PROP 50 MCG SPRAY] 48 mL     Sig: PLACE 1 SPRAY INTO BOTH NOSTRILS 2 (TWO) TIMES DAILY     There is no refill protocol information for this order

## 2022-03-07 DIAGNOSIS — G620 Drug-induced polyneuropathy: Secondary | ICD-10-CM | POA: Diagnosis not present

## 2022-03-07 DIAGNOSIS — I10 Essential (primary) hypertension: Secondary | ICD-10-CM | POA: Diagnosis not present

## 2022-03-07 DIAGNOSIS — R7303 Prediabetes: Secondary | ICD-10-CM | POA: Diagnosis not present

## 2022-03-07 DIAGNOSIS — R600 Localized edema: Secondary | ICD-10-CM | POA: Diagnosis not present

## 2022-03-07 DIAGNOSIS — C342 Malignant neoplasm of middle lobe, bronchus or lung: Secondary | ICD-10-CM | POA: Diagnosis not present

## 2022-03-07 DIAGNOSIS — R21 Rash and other nonspecific skin eruption: Secondary | ICD-10-CM | POA: Diagnosis not present

## 2022-03-07 DIAGNOSIS — D329 Benign neoplasm of meninges, unspecified: Secondary | ICD-10-CM | POA: Diagnosis not present

## 2022-03-08 ENCOUNTER — Other Ambulatory Visit (HOSPITAL_COMMUNITY): Payer: Self-pay | Admitting: Neurosurgery

## 2022-03-08 DIAGNOSIS — D329 Benign neoplasm of meninges, unspecified: Secondary | ICD-10-CM

## 2022-03-10 ENCOUNTER — Other Ambulatory Visit: Payer: Self-pay | Admitting: Family Medicine

## 2022-03-10 NOTE — Telephone Encounter (Signed)
Requested Prescriptions  Pending Prescriptions Disp Refills   fluticasone (FLONASE) 50 MCG/ACT nasal spray [Pharmacy Med Name: FLUTICASONE PROP 50 MCG SPRAY] 48 mL     Sig: PLACE 1 SPRAY INTO BOTH NOSTRILS 2 (TWO) TIMES DAILY     There is no refill protocol information for this order

## 2022-03-16 ENCOUNTER — Inpatient Hospital Stay (HOSPITAL_BASED_OUTPATIENT_CLINIC_OR_DEPARTMENT_OTHER): Payer: Medicare HMO | Admitting: Hematology

## 2022-03-16 ENCOUNTER — Inpatient Hospital Stay: Payer: Medicare HMO

## 2022-03-16 VITALS — BP 117/66 | HR 90 | Temp 97.3°F | Resp 20 | Ht 62.0 in | Wt 239.6 lb

## 2022-03-16 DIAGNOSIS — Z95828 Presence of other vascular implants and grafts: Secondary | ICD-10-CM

## 2022-03-16 DIAGNOSIS — I2699 Other pulmonary embolism without acute cor pulmonale: Secondary | ICD-10-CM | POA: Diagnosis not present

## 2022-03-16 DIAGNOSIS — Z452 Encounter for adjustment and management of vascular access device: Secondary | ICD-10-CM | POA: Diagnosis not present

## 2022-03-16 DIAGNOSIS — C3490 Malignant neoplasm of unspecified part of unspecified bronchus or lung: Secondary | ICD-10-CM

## 2022-03-16 DIAGNOSIS — Z5111 Encounter for antineoplastic chemotherapy: Secondary | ICD-10-CM | POA: Diagnosis not present

## 2022-03-16 DIAGNOSIS — Z5112 Encounter for antineoplastic immunotherapy: Secondary | ICD-10-CM | POA: Diagnosis not present

## 2022-03-16 DIAGNOSIS — C3492 Malignant neoplasm of unspecified part of left bronchus or lung: Secondary | ICD-10-CM | POA: Diagnosis not present

## 2022-03-16 DIAGNOSIS — Z7901 Long term (current) use of anticoagulants: Secondary | ICD-10-CM | POA: Diagnosis not present

## 2022-03-16 DIAGNOSIS — Z79899 Other long term (current) drug therapy: Secondary | ICD-10-CM | POA: Diagnosis not present

## 2022-03-16 DIAGNOSIS — C787 Secondary malignant neoplasm of liver and intrahepatic bile duct: Secondary | ICD-10-CM | POA: Diagnosis not present

## 2022-03-16 LAB — CBC WITH DIFFERENTIAL/PLATELET
Abs Immature Granulocytes: 0.01 10*3/uL (ref 0.00–0.07)
Basophils Absolute: 0 10*3/uL (ref 0.0–0.1)
Basophils Relative: 1 %
Eosinophils Absolute: 0.1 10*3/uL (ref 0.0–0.5)
Eosinophils Relative: 2 %
HCT: 31.5 % — ABNORMAL LOW (ref 36.0–46.0)
Hemoglobin: 9.5 g/dL — ABNORMAL LOW (ref 12.0–15.0)
Immature Granulocytes: 0 %
Lymphocytes Relative: 12 %
Lymphs Abs: 0.5 10*3/uL — ABNORMAL LOW (ref 0.7–4.0)
MCH: 29.7 pg (ref 26.0–34.0)
MCHC: 30.2 g/dL (ref 30.0–36.0)
MCV: 98.4 fL (ref 80.0–100.0)
Monocytes Absolute: 0.5 10*3/uL (ref 0.1–1.0)
Monocytes Relative: 12 %
Neutro Abs: 3.2 10*3/uL (ref 1.7–7.7)
Neutrophils Relative %: 73 %
Platelets: 397 10*3/uL (ref 150–400)
RBC: 3.2 MIL/uL — ABNORMAL LOW (ref 3.87–5.11)
RDW: 13.7 % (ref 11.5–15.5)
WBC: 4.3 10*3/uL (ref 4.0–10.5)
nRBC: 0 % (ref 0.0–0.2)

## 2022-03-16 LAB — COMPREHENSIVE METABOLIC PANEL
ALT: 18 U/L (ref 0–44)
AST: 24 U/L (ref 15–41)
Albumin: 3 g/dL — ABNORMAL LOW (ref 3.5–5.0)
Alkaline Phosphatase: 82 U/L (ref 38–126)
Anion gap: 7 (ref 5–15)
BUN: 9 mg/dL (ref 8–23)
CO2: 25 mmol/L (ref 22–32)
Calcium: 8.7 mg/dL — ABNORMAL LOW (ref 8.9–10.3)
Chloride: 108 mmol/L (ref 98–111)
Creatinine, Ser: 0.83 mg/dL (ref 0.44–1.00)
GFR, Estimated: >60 mL/min (ref >60)
Glucose, Bld: 99 mg/dL (ref 70–99)
Potassium: 3.9 mmol/L (ref 3.5–5.1)
Sodium: 140 mmol/L (ref 135–145)
Total Bilirubin: 0.2 mg/dL — ABNORMAL LOW (ref 0.3–1.2)
Total Protein: 6.5 g/dL (ref 6.5–8.1)

## 2022-03-16 LAB — TSH: TSH: 1.385 u[IU]/mL (ref 0.350–4.500)

## 2022-03-16 LAB — MAGNESIUM: Magnesium: 1.7 mg/dL (ref 1.7–2.4)

## 2022-03-16 MED ORDER — SODIUM CHLORIDE 0.9% FLUSH
10.0000 mL | Freq: Once | INTRAVENOUS | Status: AC
  Administered 2022-03-16: 10 mL via INTRAVENOUS

## 2022-03-16 MED ORDER — HEPARIN SOD (PORK) LOCK FLUSH 100 UNIT/ML IV SOLN
500.0000 [IU] | Freq: Once | INTRAVENOUS | Status: AC
  Administered 2022-03-16: 500 [IU] via INTRAVENOUS

## 2022-03-16 MED ORDER — SODIUM CHLORIDE 0.9% FLUSH
10.0000 mL | INTRAVENOUS | Status: DC | PRN
  Administered 2022-03-16: 10 mL via INTRAVENOUS

## 2022-03-16 NOTE — Patient Instructions (Addendum)
[place] at [facility] Discharge Instructions   You were seen and examined today by Dr. Delton Coombes.  He reviewed the results of your lab work which are normal/stable.   We will hold your treatment today due to the rash on your legs.   Return as scheduled.    Thank you for choosing [place] at [facility] to provide your oncology and hematology care.  To afford each patient quality time with our provider, please arrive at least 15 minutes before your scheduled appointment time.   If you have a lab appointment with the [facility] please come in thru the Main Entrance and check in at the main information desk.  You need to re-schedule your appointment should you arrive 10 or more minutes late.  We strive to give you quality time with our providers, and arriving late affects you and other patients whose appointments are after yours.  Also, if you no show three or more times for appointments you may be dismissed from the clinic at the providers discretion.     Again, thank you for choosing [facility].  Our hope is that these requests will decrease the amount of time that you wait before being seen by our physicians.       _____________________________________________________________  Should you have questions after your visit to [facility], please contact our office at [phone] and follow the prompts.  Our office hours are 8:00 a.m. and 4:30 p.m. Monday - Friday.  Please note that voicemails left after 4:00 p.m. may not be returned until the following business day.  We are closed weekends and major holidays.  You do have access to a nurse 24-7, just call the main number to the clinic [phone] and do not press any options, hold on the line and a nurse will answer the phone.    For prescription refill requests, have your pharmacy contact our office and allow 72 hours.    Due to Covid, you will need to wear a  mask upon entering the hospital. If you do not have a mask, a mask will be given to you at the Main Entrance upon arrival. For doctor visits, patients may have 1 support person age 69 or older with them. For treatment visits, patients can not have anyone with them due to social distancing guidelines and our immunocompromised population.

## 2022-03-16 NOTE — Patient Instructions (Signed)
[facility]  Discharge Instructions: Thank you for choosing [facility] to provide your oncology and hematology care.  If you have a lab appointment with the Cancer Center, please come in thru the Main Entrance and check in at the main information desk.  Wear comfortable clothing and clothing appropriate for easy access to any Portacath or PICC line.   We strive to give you quality time with your provider. You may need to reschedule your appointment if you arrive late (15 or more minutes).  Arriving late affects you and other patients whose appointments are after yours.  Also, if you miss three or more appointments without notifying the office, you may be dismissed from the clinic at the provider's discretion.      For prescription refill requests, have your pharmacy contact our office and allow 72 hours for refills to be completed.    To help prevent nausea and vomiting after your treatment, we encourage you to take your nausea medication as directed.  BELOW ARE SYMPTOMS THAT SHOULD BE REPORTED IMMEDIATELY: *FEVER GREATER THAN 100.4 F (38 C) OR HIGHER *CHILLS OR SWEATING *NAUSEA AND VOMITING THAT IS NOT CONTROLLED WITH YOUR NAUSEA MEDICATION *UNUSUAL SHORTNESS OF BREATH *UNUSUAL BRUISING OR BLEEDING *URINARY PROBLEMS (pain or burning when urinating, or frequent urination) *BOWEL PROBLEMS (unusual diarrhea, constipation, pain near the anus) TENDERNESS IN MOUTH AND THROAT WITH OR WITHOUT PRESENCE OF ULCERS (sore throat, sores in mouth, or a toothache) UNUSUAL RASH, SWELLING OR PAIN  UNUSUAL VAGINAL DISCHARGE OR ITCHING   Items with * indicate a potential emergency and should be followed up as soon as possible or go to the Emergency Department if any problems should occur.  Please show the CHEMOTHERAPY ALERT CARD or IMMUNOTHERAPY ALERT CARD at check-in to the Emergency Department and triage nurse.  Should you have questions after your visit or need to  cancel or reschedule your appointment, please contact [facility] [phone]  and follow the prompts.  Office hours are 8:00 a.m. to 4:30 p.m. Monday - Friday. Please note that voicemails left after 4:00 p.m. may not be returned until the following business day.  We are closed weekends and major holidays. You have access to a nurse at all times for urgent questions. Please call the main number to the clinic [phone] and follow the prompts.  For any non-urgent questions, you may also contact your provider using MyChart. We now offer e-Visits for anyone 18 and older to request care online for non-urgent symptoms. For details visit [email or web address].   Also download the MyChart app! Go to the app store, search "MyChart", open the app, select [facility], and log in with your MyChart username and password.   

## 2022-03-16 NOTE — Progress Notes (Signed)
Hold treatment today per Dr. Delton Coombes due to rash on bilateral lower legs. Patient left via wheelchair in stable condition.

## 2022-03-16 NOTE — Progress Notes (Signed)
[place], [place] [id number]   CLINIC:  Medical Oncology/Hematology  PCP:  Iona Beard, [address] / [place] [address] [phone]   REASON FOR VISIT:  Follow-up for stage IV adenocarcinoma of the lung, negative for targetable mutations  PRIOR THERAPY:  1. Chemoradiation with carboplatin and paclitaxel from 01/07/2019 to 02/11/2019. 2. Consolidation with durvalumab from 03/19/2019 to 04/16/2019, held due to pneumonitis.  NGS Results: not done  CURRENT THERAPY: Pemetrexed and pembrolizumab maintenance  BRIEF ONCOLOGIC HISTORY:  Oncology History  Adenocarcinoma of left lung ([facility])  12/09/2018 Initial Diagnosis   Adenocarcinoma of left lung ([facility])   01/02/2019 Cancer Staging   Staging form: Lung, AJCC 8th Edition - Clinical stage from 01/02/2019: Stage IIIB (cT3, cN2, cM0) - Signed by Derek Jack, MD on 01/02/2019   01/07/2019 - 02/11/2019 Chemotherapy   The patient had palonosetron (ALOXI) injection 0.25 mg, 0.25 mg, Intravenous,  Once, 6 of 6 cycles Administration: 0.25 mg (01/07/2019), 0.25 mg (01/14/2019), 0.25 mg (01/21/2019), 0.25 mg (01/28/2019), 0.25 mg (02/04/2019), 0.25 mg (02/11/2019) CARBOplatin (PARAPLATIN) 270 mg in sodium chloride 0.9 % 250 mL chemo infusion, 270 mg (100 % of original dose 266.4 mg), Intravenous,  Once, 6 of 6 cycles Dose modification:   (original dose 266.4 mg, Cycle 1),   (original dose 266.4 mg, Cycle 2),   (original dose 266.4 mg, Cycle 3),   (original dose 266.4 mg, Cycle 4) Administration: 270 mg (01/07/2019), 270 mg (01/14/2019), 270 mg (01/21/2019), 270 mg (01/28/2019), 270 mg (02/04/2019), 270 mg (02/11/2019) PACLitaxel (TAXOL) 108 mg in sodium chloride 0.9 % 250 mL chemo infusion (</= 18m/m2), 45 mg/m2 = 108 mg, Intravenous,  Once, 6 of 6 cycles Administration: 108 mg (01/07/2019), 108 mg (01/14/2019), 108 mg (01/21/2019), 108 mg (01/28/2019), 108 mg (02/04/2019), 108 mg  (02/11/2019) fosaprepitant (EMEND) 150 mg, dexamethasone (DECADRON) 12 mg in sodium chloride 0.9 % 145 mL IVPB, , Intravenous,  Once, 5 of 5 cycles Administration:  (01/14/2019),  (01/21/2019),  (01/28/2019),  (02/04/2019),  (02/11/2019)  for chemotherapy treatment.    03/19/2019 - 07/23/2020 Chemotherapy   Patient is on Treatment Plan : LUNG DURVALUMAB [id number]    09/22/2021 - 11/04/2021 Chemotherapy   Patient is on Treatment Plan : LUNG Carboplatin (5) + Pemetrexed (500) + Pembrolizumab (200) D1 q21d Induction x 4 cycles / Maintenance Pemetrexed (500) + Pembrolizumab (200) D1 q21d     09/22/2021 -  Chemotherapy   Patient is on Treatment Plan : LUNG Carboplatin (5) + Pemetrexed (500) + Pembrolizumab (200) D1 q21d Induction x 4 cycles / Maintenance Pemetrexed (500) + Pembrolizumab (200) D1 q21d       CANCER STAGING:  Cancer Staging  Adenocarcinoma of left lung ([place] Staging form: Lung, AJCC 8th Edition - Clinical stage from 01/02/2019: Stage IIIB (cT3, cN2, cM0) - Signed by KDerek Jack MD on 01/02/2019 - Pathologic stage from 09/07/2021: Stage IVA (pTX, pNX, pM1b) - Unsigned   INTERVAL HISTORY:  Ms. ALamyah Creed a 71y.o. female, seen for follow-up of metastatic lung cancer and oxidase men prior to next cycle of pembrolizumab and pemetrexed.  In the last 2 weeks, she developed right leg itching and hyperpigmented rash.  It was also desquamating.  She was seen by Dr. HBerdine Addisonher PMD, and was told to use hydrocortisone twice daily.  Some changes to her Lasix, gabapentin and potassium were made.   REVIEW OF SYSTEMS:  Review of Systems  Respiratory:  Positive for  shortness of breath (On exertion).   Musculoskeletal:  Positive for arthralgias (7/10 R knee).  Skin:  Positive for rash (Right leg hyperpigmentation and desquamation).  Neurological:  Positive for dizziness.  All other systems reviewed and are negative.   PAST MEDICAL/SURGICAL HISTORY:  Past Medical History:  Diagnosis Date    Anemia    Aortic stenosis    Arthritis    Coronary artery calcification seen on CT scan    Essential hypertension    GERD (gastroesophageal reflux disease)    History of lung cancer    Stage III adenocarcinoma status post chemoradiation   Port-A-Cath in place 01/06/2019   Type 2 diabetes mellitus ([facility])    Past Surgical History:  Procedure Laterality Date   CHOLECYSTECTOMY  1997   COLONOSCOPY     GASTRIC BYPASS     INCISIONAL HERNIA REPAIR  04/11/11   IR IMAGING GUIDED PORT INSERTION  12/27/2018   Right   IR US GUIDE BX ASP/DRAIN  08/08/2021   LAPAROSCOPIC SALPINGOOPHERECTOMY     LAPAROTOMY  04/11/2011   Procedure: EXPLORATORY LAPAROTOMY;  Surgeon: Joyice Faster. Cornett, MD;  Location: [facility];  Service: General;  Laterality: N/A;  closure port hole   RIGHT/LEFT HEART CATH AND CORONARY ANGIOGRAPHY N/A 12/29/2019   Procedure: RIGHT/LEFT HEART CATH AND CORONARY ANGIOGRAPHY;  Surgeon: Troy Sine, MD;  Location: [facility];  Service: Cardiovascular;  Laterality: N/A;   TOTAL HIP ARTHROPLASTY  03/07/2012   Procedure: TOTAL HIP ARTHROPLASTY ANTERIOR APPROACH;  Surgeon: Mauri Pole, MD;  Location: [facility];  Service: Orthopedics;  Laterality: Right;   TOTAL SHOULDER ARTHROPLASTY Left 01/21/2015   TOTAL SHOULDER ARTHROPLASTY Left 01/21/2015   Procedure: LEFT TOTAL SHOULDER ARTHROPLASTY;  Surgeon: Justice Britain, MD;  Location: [facility];  Service: Orthopedics;  Laterality: Left;   VAGINAL HYSTERECTOMY      SOCIAL HISTORY:  Social History   Socioeconomic History   Marital status: Single    Spouse name: Not on file   Number of children: Not on file   Years of education: 12th grade   Highest education level: Not on file  Occupational History   Occupation: Employed    Employer: [employer]  Tobacco Use   Smoking status: Former    Packs/day: 1.00    Years: 12.00    Total pack years: 12.00    Types: Cigarettes    Quit date: 03/20/1976    Years since quitting: 46.0    Smokeless tobacco: Never  Vaping Use   Vaping Use: Never used  Substance and Sexual Activity   Alcohol use: No   Drug use: No   Sexual activity: Not Currently  Other Topics Concern   Not on file  Social History Narrative   Not on file   Social Determinants of Health   Financial Resource Strain: Low Risk  (12/06/2018)   Overall Financial Resource Strain (CARDIA)    Difficulty of Paying Living Expenses: Not very hard  Food Insecurity: No Food Insecurity (12/06/2018)   Hunger Vital Sign    Worried About Running Out of Food in the Last Year: Never true    Ran Out of Food in the Last Year: Never true  Transportation Needs: No Transportation Needs (12/06/2018)   PRAPARE - Hydrologist (Medical): No    Lack of Transportation (Non-Medical): No  Physical Activity: Inactive (12/06/2018)   Exercise Vital Sign    Days of Exercise per Week: 0 days    Minutes  of Exercise per Session: 0 min  Stress: No Stress Concern Present (12/06/2018)   [place]    Feeling of Stress : Only a little  Social Connections: Moderately Isolated (12/06/2018)   Social Connection and Isolation Panel [NHANES]    Frequency of Communication with Friends and Family: More than three times a week    Frequency of Social Gatherings with Friends and Family: Once a week    Attends Religious Services: More than 4 times per year    Active Member of [employer] or Organizations: No    Attends Archivist Meetings: Never    Marital Status: Never married  Intimate Partner Violence: Not At Risk (12/06/2018)   Humiliation, Afraid, Rape, and Kick questionnaire    Fear of Current or Ex-Partner: No    Emotionally Abused: No    Physically Abused: No    Sexually Abused: No    FAMILY HISTORY:  Family History  Problem Relation Age of Onset   Breast cancer Mother    COPD Mother    Arthritis Mother    Diabetes Mother    Hypertension  Mother    Hypertension Father    Diabetes Father    Breast cancer Sister    Thyroid cancer Brother    Huntington's disease Maternal Grandmother    Heart attack Brother     CURRENT MEDICATIONS:  Current Outpatient Medications  Medication Sig Dispense Refill   apixaban (ELIQUIS) 5 MG TABS tablet Take 1 tablet (5 mg total) by mouth 2 (two) times daily. 60 tablet 6   Calcium Carbonate Antacid (TUMS PO) Take 4 tablets by mouth daily.     carvedilol (COREG) 3.125 MG tablet TAKE 1 TABLET BY MOUTH 2 TIMES DAILY. 180 tablet 1   Cyanocobalamin (VITAMIN B-12) 2500 MCG SUBL Place 2,500 mcg under the tongue every morning.      diphenhydrAMINE (BENADRYL) 25 MG tablet Take 25 mg by mouth every 6 (six) hours as needed for itching or allergies.     ferrous sulfate 325 (65 FE) MG tablet Take 325 mg by mouth every evening.     fluticasone (FLONASE) 50 MCG/ACT nasal spray Place 1 spray into both nostrils 2 (two) times daily. 16 g 2   folic acid (FOLVITE) 1 MG tablet TAKE 1 TABLET BY MOUTH EVERY DAY 90 tablet 1   furosemide (LASIX) 20 MG tablet Take 20 mg by mouth 2 (two) times daily.     gabapentin (NEURONTIN) 300 MG capsule Take 300 mg by mouth 2 (two) times daily.     Multiple Vitamin (MULITIVITAMIN WITH MINERALS) TABS Take 1 tablet by mouth daily.     omeprazole (PRILOSEC) 20 MG capsule Take 20 mg by mouth daily.     Potassium Chloride ER 20 MEQ TBCR Take 1 tablet by mouth 2 (two) times daily.     No current facility-administered medications for this visit.   Facility-Administered Medications Ordered in Other Visits  Medication Dose Route Frequency Provider Last Rate Last Admin   magnesium sulfate 2 GM/50ML IVPB             ALLERGIES:  No Known Allergies  PHYSICAL EXAM:  Performance status (ECOG): 1 - Symptomatic but completely ambulatory  There were no vitals filed for this visit.  Wt Readings from Last 3 Encounters:  03/16/22 239 lb 9.6 oz (108.7 kg)  02/23/22 249 lb 1.9 oz (113 kg)   02/02/22 244 lb 14.9 oz (111.1 kg)   Physical Exam  Vitals reviewed.  Constitutional:      Appearance: Normal appearance.     Comments: In wheelchair  Cardiovascular:     Rate and Rhythm: Normal rate and regular rhythm.     Pulses: Normal pulses.     Heart sounds: Murmur heard.     Systolic murmur is present.  Pulmonary:     Effort: Pulmonary effort is normal.     Breath sounds: Normal breath sounds.  Musculoskeletal:     Right lower leg: No edema.     Left lower leg: No edema.  Neurological:     General: No focal deficit present.     Mental Status: She is alert and oriented to person, place, and time.  Psychiatric:        Mood and Affect: Mood normal.        Behavior: Behavior normal.     LABORATORY DATA:  I have reviewed the labs as listed.     Latest Ref Rng & Units 03/16/2022   10:04 AM 02/23/2022    9:28 AM 02/02/2022    8:55 AM  CBC  WBC 4.0 - 10.5 K/uL 4.3  5.0  3.6   Hemoglobin 12.0 - 15.0 g/dL 9.5  9.4  9.9   Hematocrit 36.0 - 46.0 % 31.5  31.3  31.8   Platelets 150 - 400 K/uL 397  388  332       Latest Ref Rng & Units 03/16/2022   10:04 AM 02/23/2022    9:28 AM 02/02/2022    8:55 AM  CMP  Glucose 70 - 99 mg/dL 99  106  99   BUN 8 - 23 mg/dL _0 Creatinine 0.44 - 1.00 mg/dL 0.83  0.77  0.84   Sodium 135 - 145 mmol/L 140  139  142   Potassium 3.5 - 5.1 mmol/L 3.9  3.5  3.7   Chloride 98 - 111 mmol/L 108  107  108   CO2 22 - 32 mmol/L _1 Calcium 8.9 - 10.3 mg/dL 8.7  8.6  8.6   Total Protein 6.5 - 8.1 g/dL 6.5  6.3  6.2   Total Bilirubin 0.3 - 1.2 mg/dL 0.2  0.5  0.5   Alkaline Phos 38 - 126 U/L 82  82  74   AST 15 - 41 U/L _2 ALT 0 - 44 U/L _3 DIAGNOSTIC IMAGING:  I have independently reviewed the scans and discussed with the patient. MM 3D SCREEN BREAST BILATERAL  Result Date: 03/02/2022 CLINICAL DATA:  Screening. EXAM: DIGITAL SCREENING BILATERAL MAMMOGRAM WITH TOMOSYNTHESIS AND CAD TECHNIQUE: Bilateral  screening digital craniocaudal and mediolateral oblique mammograms were obtained. Bilateral screening digital breast tomosynthesis was performed. The images were evaluated with computer-aided detection. COMPARISON:  Previous exam(s). ACR Breast Density Category a: The breast tissue is almost entirely fatty. FINDINGS: There are no findings suspicious for malignancy. IMPRESSION: No mammographic evidence of malignancy. A result letter of this screening mammogram will be mailed directly to the patient. RECOMMENDATION: Screening mammogram in one year. (Code:SM-B-01Y) BI-RADS CATEGORY  1: Negative. Electronically Signed   By: Evangeline Dakin M.D.   On: 03/02/2022 16:21     ASSESSMENT:  1.  Adenocarcinoma of left lung ([facility]) -Chemoradiation therapy with carboplatin and paclitaxel from 01/07/2019 through 02/11/2019. -Consolidation immunotherapy with durvalumab from 03/19/2019 through 04/15/2018, held due to pneumonitis. -CT chest on 07/02/2019 shows  left upper lobe lung mass measuring 2.6 x 2.0 cm.  It shows improvement in size. -MRI of the brain on 07/18/2019 showed left sphenoid wing meningioma measuring 2.6 x 2.0 x 2.6 cm unchanged.  No new enhancing intracranial lesion.  Increased left temporal white matter edema. -Durvalumab restarted on 07/24/2019. -She was evaluated by cardiology with a stress test.  EF was 46%.  She underwent cardiac catheterization which did not show any abnormalities. - 1 year of durvalumab completed on 07/23/2020. - Liver mass biopsy (08/08/2021): Adenocarcinoma, CK7 positive, negative for TTF-1, Napsin a, GATA3, ER, CK20, CDX2 - PET scan (08/25/2021): Right upper lobe lung nodule 9 mm, SUV 5.0.  Groundglass and solid nodule periphery of the right upper lobe 8 mm, SUV 2.45.  Right lobe liver mass 4.5 x 3.6 cm, SUV 7.78. - NGS testing: PD-L1 negative, TMB-low, MSI-stable, K-ras G12 D.  No other targetable mutations. - Carboplatin, pemetrexed and pembrolizumab 4 cycles from 09/22/2021 through  11/25/2021, followed by maintenance pemetrexed and pembrolizumab -  CT CAP (11/10/2021): Reduced size of liver mass and interval resolution of previously seen groundglass pulmonary nodules.  There is a new inflammatory right upper lobe nodule.  PLAN:  1.  Metastatic adenocarcinoma of the lung to the liver and right lung: - CT CAP on 01/31/2022 showed stable postradiation like fibrosis in the left lung.  Previously noted RUL nodule is smaller indicating infectious/inflammatory etiology.  Metastatic lesions in the right lobe of the liver appear to be similar. - She has developed right leg itching and hyperpigmented rash for the last 2 weeks.  She is applying hydrocortisone twice daily.  She still has a lot of itching. - I will hold her treatment today. - I will refer her to dermatology clinic. - RTC 2 weeks for follow-up and possible treatment.   2.  Meningioma: - MRI of the brain on 09/27/2021 with stable meningioma and no new lesions.  She will have follow-up MRI in January.  3.  Unprovoked pulmonary embolism: - Continue Eliquis 5 mg twice daily indefinitely.  4.  Leg swellings: - Dr. Berdine Addison has increased her Lasix to 40 mg daily.  K-Dur was increased to 20 mEq 2 tablets daily.  5.  Dry eyes with itching: - Continue to use refresh eyedrops.   Orders placed this encounter:  No orders of the defined types were placed in this encounter.     Derek Jack, MD [place] [phone]

## 2022-03-16 NOTE — Progress Notes (Signed)
[place], [place] [address]   CLINIC:  Medical Oncology/Hematology  PCP:  Iona Beard, [address] / [place] [address] [phone]   REASON FOR VISIT:  Follow-up for stage IV adenocarcinoma of the lung, negative for targetable mutations  PRIOR THERAPY:  1. Chemoradiation with carboplatin and paclitaxel from 01/07/2019 to 02/11/2019. 2. Consolidation with durvalumab from 03/19/2019 to 04/16/2019, held due to pneumonitis.  NGS Results: not done  CURRENT THERAPY: Consolidation with durvalumab every 2 weeks; brain mets SRS in 5 fractions  BRIEF ONCOLOGIC HISTORY:  Oncology History  Adenocarcinoma of left lung ([facility])  12/09/2018 Initial Diagnosis   Adenocarcinoma of left lung ([facility] [place])   01/02/2019 Cancer Staging   Staging form: Lung, AJCC 8th Edition - Clinical stage from 01/02/2019: Stage IIIB (cT3, cN2, cM0) - Signed by Derek Jack, MD on 01/02/2019   01/07/2019 - 02/11/2019 Chemotherapy   The patient had palonosetron (ALOXI) injection 0.25 mg, 0.25 mg, Intravenous,  Once, 6 of 6 cycles Administration: 0.25 mg (01/07/2019), 0.25 mg (01/14/2019), 0.25 mg (01/21/2019), 0.25 mg (01/28/2019), 0.25 mg (02/04/2019), 0.25 mg (02/11/2019) CARBOplatin (PARAPLATIN) 270 mg in sodium chloride 0.9 % 250 mL chemo infusion, 270 mg (100 % of original dose 266.4 mg), Intravenous,  Once, 6 of 6 cycles Dose modification:   (original dose 266.4 mg, Cycle 1),   (original dose 266.4 mg, Cycle 2),   (original dose 266.4 mg, Cycle 3),   (original dose 266.4 mg, Cycle 4) Administration: 270 mg (01/07/2019), 270 mg (01/14/2019), 270 mg (01/21/2019), 270 mg (01/28/2019), 270 mg (02/04/2019), 270 mg (02/11/2019) PACLitaxel (TAXOL) 108 mg in sodium chloride 0.9 % 250 mL chemo infusion (</= 80m/m2), 45 mg/m2 = 108 mg, Intravenous,  Once, 6 of 6 cycles Administration: 108 mg (01/07/2019), 108 mg (01/14/2019), 108 mg (01/21/2019), 108 mg (01/28/2019), 108  mg (02/04/2019), 108 mg (02/11/2019) fosaprepitant (EMEND) 150 mg, dexamethasone (DECADRON) 12 mg in sodium chloride 0.9 % 145 mL IVPB, , Intravenous,  Once, 5 of 5 cycles Administration:  (01/14/2019),  (01/21/2019),  (01/28/2019),  (02/04/2019),  (02/11/2019)  for chemotherapy treatment.    03/19/2019 - 07/23/2020 Chemotherapy   Patient is on Treatment Plan : LUNG DURVALUMAB [id number]    09/22/2021 - 11/04/2021 Chemotherapy   Patient is on Treatment Plan : LUNG Carboplatin (5) + Pemetrexed (500) + Pembrolizumab (200) D1 q21d Induction x 4 cycles / Maintenance Pemetrexed (500) + Pembrolizumab (200) D1 q21d     09/22/2021 -  Chemotherapy   Patient is on Treatment Plan : LUNG Carboplatin (5) + Pemetrexed (500) + Pembrolizumab (200) D1 q21d Induction x 4 cycles / Maintenance Pemetrexed (500) + Pembrolizumab (200) D1 q21d       CANCER STAGING:  Cancer Staging  Adenocarcinoma of left lung ([place] Staging form: Lung, AJCC 8th Edition - Clinical stage from 01/02/2019: Stage IIIB (cT3, cN2, cM0) - Signed by KDerek Jack MD on 01/02/2019 - Pathologic stage from 09/07/2021: Stage IVA (pTX, pNX, pM1b) - Unsigned   INTERVAL HISTORY:  Ms. AAmana Bouska a 71y.o. female, is seen today for follow-up of metastatic lung cancer.  After each treatment she reports 5 days of feeling tired, nauseous and occasional dizziness.  She is continuing to take folic acid daily.  She denies any rash or diarrhea.  She continues to have tearing of eyes.   REVIEW OF SYSTEMS:  Review of Systems  Eyes:  Positive for eye problems (Dryness and tearing).  Respiratory:  Positive  for shortness of breath (On exertion).   Musculoskeletal:  Positive for arthralgias (7/10 R knee).  Neurological:  Positive for dizziness.  All other systems reviewed and are negative.   PAST MEDICAL/SURGICAL HISTORY:  Past Medical History:  Diagnosis Date   Anemia    Aortic stenosis    Arthritis    Coronary artery calcification seen on CT scan     Essential hypertension    GERD (gastroesophageal reflux disease)    History of lung cancer    Stage III adenocarcinoma status post chemoradiation   Port-A-Cath in place 01/06/2019   Type 2 diabetes mellitus ([facility])    Past Surgical History:  Procedure Laterality Date   CHOLECYSTECTOMY  1997   COLONOSCOPY     GASTRIC BYPASS     INCISIONAL HERNIA REPAIR  04/11/11   IR IMAGING GUIDED PORT INSERTION  12/27/2018   Right   IR US GUIDE BX ASP/DRAIN  08/08/2021   LAPAROSCOPIC SALPINGOOPHERECTOMY     LAPAROTOMY  04/11/2011   Procedure: EXPLORATORY LAPAROTOMY;  Surgeon: Joyice Faster. Cornett, MD;  Location: [facility];  Service: General;  Laterality: N/A;  closure port hole   RIGHT/LEFT HEART CATH AND CORONARY ANGIOGRAPHY N/A 12/29/2019   Procedure: RIGHT/LEFT HEART CATH AND CORONARY ANGIOGRAPHY;  Surgeon: Troy Sine, MD;  Location: [facility];  Service: Cardiovascular;  Laterality: N/A;   TOTAL HIP ARTHROPLASTY  03/07/2012   Procedure: TOTAL HIP ARTHROPLASTY ANTERIOR APPROACH;  Surgeon: Mauri Pole, MD;  Location: [facility];  Service: Orthopedics;  Laterality: Right;   TOTAL SHOULDER ARTHROPLASTY Left 01/21/2015   TOTAL SHOULDER ARTHROPLASTY Left 01/21/2015   Procedure: LEFT TOTAL SHOULDER ARTHROPLASTY;  Surgeon: Justice Britain, MD;  Location: [facility];  Service: Orthopedics;  Laterality: Left;   VAGINAL HYSTERECTOMY      SOCIAL HISTORY:  Social History   Socioeconomic History   Marital status: Single    Spouse name: Not on file   Number of children: Not on file   Years of education: 12th grade   Highest education level: Not on file  Occupational History   Occupation: Employed    Employer: [employer]  Tobacco Use   Smoking status: Former    Packs/day: 1.00    Years: 12.00    Total pack years: 12.00    Types: Cigarettes    Quit date: 03/20/1976    Years since quitting: 46.0   Smokeless tobacco: Never  Vaping Use   Vaping Use: Never used  Substance and Sexual Activity    Alcohol use: No   Drug use: No   Sexual activity: Not Currently  Other Topics Concern   Not on file  Social History Narrative   Not on file   Social Determinants of Health   Financial Resource Strain: Low Risk  (12/06/2018)   Overall Financial Resource Strain (CARDIA)    Difficulty of Paying Living Expenses: Not very hard  Food Insecurity: No Food Insecurity (12/06/2018)   Hunger Vital Sign    Worried About Running Out of Food in the Last Year: Never true    Ran Out of Food in the Last Year: Never true  Transportation Needs: No Transportation Needs (12/06/2018)   PRAPARE - Hydrologist (Medical): No    Lack of Transportation (Non-Medical): No  Physical Activity: Inactive (12/06/2018)   Exercise Vital Sign    Days of Exercise per Week: 0 days    Minutes of Exercise per Session: 0 min  Stress: No Stress  Concern Present (12/06/2018)   [place]    Feeling of Stress : Only a little  Social Connections: Moderately Isolated (12/06/2018)   Social Connection and Isolation Panel [NHANES]    Frequency of Communication with Friends and Family: More than three times a week    Frequency of Social Gatherings with Friends and Family: Once a week    Attends Religious Services: More than 4 times per year    Active Member of [employer] or Organizations: No    Attends Archivist Meetings: Never    Marital Status: Never married  Intimate Partner Violence: Not At Risk (12/06/2018)   Humiliation, Afraid, Rape, and Kick questionnaire    Fear of Current or Ex-Partner: No    Emotionally Abused: No    Physically Abused: No    Sexually Abused: No    FAMILY HISTORY:  Family History  Problem Relation Age of Onset   Breast cancer Mother    COPD Mother    Arthritis Mother    Diabetes Mother    Hypertension Mother    Hypertension Father    Diabetes Father    Breast cancer Sister    Thyroid cancer  Brother    Huntington's disease Maternal Grandmother    Heart attack Brother     CURRENT MEDICATIONS:  Current Outpatient Medications  Medication Sig Dispense Refill   albuterol (VENTOLIN HFA) 108 (90 Base) MCG/ACT inhaler TAKE 2 PUFFS BY MOUTH EVERY 6 HOURS AS NEEDED FOR WHEEZE OR SHORTNESS OF BREATH (Patient taking differently: Inhale 2 puffs into the lungs every 6 (six) hours as needed for wheezing or shortness of breath.) 18 each 6   apixaban (ELIQUIS) 5 MG TABS tablet Take 1 tablet (5 mg total) by mouth 2 (two) times daily. 60 tablet 6   benzonatate (TESSALON) 100 MG capsule Take 1 capsule (100 mg total) by mouth 3 (three) times daily as needed for cough. 21 capsule 0   Calcium Carbonate Antacid (TUMS PO) Take 4 tablets by mouth daily.     carvedilol (COREG) 3.125 MG tablet TAKE 1 TABLET BY MOUTH 2 TIMES DAILY. 180 tablet 1   Cyanocobalamin (VITAMIN B-12) 2500 MCG SUBL Place 2,500 mcg under the tongue every morning.      cycloSPORINE (RESTASIS) 0.05 % ophthalmic emulsion Place 1 drop into both eyes 2 (two) times daily. (Patient not taking: Reported on 02/02/2022) 60 each 3   diphenhydrAMINE (BENADRYL) 25 MG tablet Take 25 mg by mouth every 6 (six) hours as needed for itching or allergies.     ferrous sulfate 325 (65 FE) MG tablet Take 325 mg by mouth every evening.     fluticasone (FLONASE) 50 MCG/ACT nasal spray Place 1 spray into both nostrils 2 (two) times daily. 16 g 2   folic acid (FOLVITE) 1 MG tablet TAKE 1 TABLET BY MOUTH EVERY DAY 90 tablet 1   furosemide (LASIX) 20 MG tablet Take 20 mg by mouth as needed.     gabapentin (NEURONTIN) 300 MG capsule Take 300 mg by mouth at bedtime as needed (Nerve Pain).     Multiple Vitamin (MULITIVITAMIN WITH MINERALS) TABS Take 1 tablet by mouth daily.     omeprazole (PRILOSEC) 20 MG capsule Take 20 mg by mouth daily.     Potassium Chloride ER 20 MEQ TBCR Take 1 tablet by mouth daily.     prochlorperazine (COMPAZINE) 10 MG tablet Take 1 tablet  (10 mg total) by mouth every 6 (six)  hours as needed for nausea or vomiting. 30 tablet 3   No current facility-administered medications for this visit.   Facility-Administered Medications Ordered in Other Visits  Medication Dose Route Frequency Provider Last Rate Last Admin   magnesium sulfate 2 GM/50ML IVPB             ALLERGIES:  No Known Allergies  PHYSICAL EXAM:  Performance status (ECOG): 1 - Symptomatic but completely ambulatory  There were no vitals filed for this visit.  Wt Readings from Last 3 Encounters:  02/23/22 249 lb 1.9 oz (113 kg)  02/02/22 244 lb 14.9 oz (111.1 kg)  01/12/22 244 lb 0.8 oz (110.7 kg)   Physical Exam Vitals reviewed.  Constitutional:      Appearance: Normal appearance.     Comments: In wheelchair  Cardiovascular:     Rate and Rhythm: Normal rate and regular rhythm.     Pulses: Normal pulses.     Heart sounds: Murmur heard.     Systolic murmur is present.  Pulmonary:     Effort: Pulmonary effort is normal.     Breath sounds: Normal breath sounds.  Musculoskeletal:     Right lower leg: No edema.     Left lower leg: No edema.  Neurological:     General: No focal deficit present.     Mental Status: She is alert and oriented to person, place, and time.  Psychiatric:        Mood and Affect: Mood normal.        Behavior: Behavior normal.     LABORATORY DATA:  I have reviewed the labs as listed.     Latest Ref Rng & Units 02/23/2022    9:28 AM 02/02/2022    8:55 AM 01/12/2022    8:33 AM  CBC  WBC 4.0 - 10.5 K/uL 5.0  3.6  6.1   Hemoglobin 12.0 - 15.0 g/dL 9.4  9.9  9.9   Hematocrit 36.0 - 46.0 % 31.3  31.8  32.2   Platelets 150 - 400 K/uL 388  332  303       Latest Ref Rng & Units 02/23/2022    9:28 AM 02/02/2022    8:55 AM 01/12/2022    8:33 AM  CMP  Glucose 70 - 99 mg/dL 106  99  100   BUN 8 - 23 mg/dL _0 Creatinine 0.44 - 1.00 mg/dL 0.77  0.84  0.90   Sodium 135 - 145 mmol/L 139  142  141   Potassium 3.5 - 5.1  mmol/L 3.5  3.7  3.3   Chloride 98 - 111 mmol/L 107  108  110   CO2 22 - 32 mmol/L _1 Calcium 8.9 - 10.3 mg/dL 8.6  8.6  8.3   Total Protein 6.5 - 8.1 g/dL 6.3  6.2  6.2   Total Bilirubin 0.3 - 1.2 mg/dL 0.5  0.5  0.5   Alkaline Phos 38 - 126 U/L 82  74  74   AST 15 - 41 U/L _2 ALT 0 - 44 U/L _3 DIAGNOSTIC IMAGING:  I have independently reviewed the scans and discussed with the patient. MM 3D SCREEN BREAST BILATERAL  Result Date: 03/02/2022 CLINICAL DATA:  Screening. EXAM: DIGITAL SCREENING BILATERAL MAMMOGRAM WITH TOMOSYNTHESIS AND CAD TECHNIQUE: Bilateral screening digital craniocaudal and mediolateral oblique mammograms were obtained. Bilateral screening digital breast  tomosynthesis was performed. The images were evaluated with computer-aided detection. COMPARISON:  Previous exam(s). ACR Breast Density Category a: The breast tissue is almost entirely fatty. FINDINGS: There are no findings suspicious for malignancy. IMPRESSION: No mammographic evidence of malignancy. A result letter of this screening mammogram will be mailed directly to the patient. RECOMMENDATION: Screening mammogram in one year. (Code:SM-B-01Y) BI-RADS CATEGORY  1: Negative. Electronically Signed   By: Evangeline Dakin M.D.   On: 03/02/2022 16:21     ASSESSMENT:  1.  Adenocarcinoma of left lung ([facility]) -Chemoradiation therapy with carboplatin and paclitaxel from 01/07/2019 through 02/11/2019. -Consolidation immunotherapy with durvalumab from 03/19/2019 through 04/15/2018, held due to pneumonitis. -CT chest on 07/02/2019 shows left upper lobe lung mass measuring 2.6 x 2.0 cm.  It shows improvement in size. -MRI of the brain on 07/18/2019 showed left sphenoid wing meningioma measuring 2.6 x 2.0 x 2.6 cm unchanged.  No new enhancing intracranial lesion.  Increased left temporal white matter edema. -Durvalumab restarted on 07/24/2019. -She was evaluated by cardiology with a stress test.  EF was 46%.   She underwent cardiac catheterization which did not show any abnormalities. - 1 year of durvalumab completed on 07/23/2020. - Liver mass biopsy (08/08/2021): Adenocarcinoma, CK7 positive, negative for TTF-1, Napsin a, GATA3, ER, CK20, CDX2 - PET scan (08/25/2021): Right upper lobe lung nodule 9 mm, SUV 5.0.  Groundglass and solid nodule periphery of the right upper lobe 8 mm, SUV 2.45.  Right lobe liver mass 4.5 x 3.6 cm, SUV 7.78. - NGS testing: PD-L1 negative, TMB-low, MSI-stable, K-ras G12 D.  No other targetable mutations. - Carboplatin, pemetrexed and pembrolizumab 4 cycles from 09/22/2021 through 11/25/2021, followed by maintenance pemetrexed and pembrolizumab -  CT CAP (11/10/2021): Reduced size of liver mass and interval resolution of previously seen groundglass pulmonary nodules.  There is a new inflammatory right upper lobe nodule.  PLAN:  1.  Metastatic adenocarcinoma of the lung to the liver and right lung: - CT CAP (01/31/2022): Stable postradiation like fibrosis in the left lung.  Previously noted RUL nodule is smaller indicating infectious/inflammatory etiology.  Metastatic lesions in the right lobe of the liver appear relatively similar to prior exam. - Labs today shows normal LFTs and creatinine.  CBC was grossly stable with anemia and mild leukopenia.  Last TSH was 1.2. - She will proceed with pemetrexed and pembrolizumab today.  I discussed that she will continue treatments until progression or intolerance. - She is having 5 days of tiredness, dizziness, nausea after each treatment.  After her next scan in 3 months, we may discontinue pemetrexed if she continues to have problems.   2.  Meningioma: - MRI of the brain on 09/27/2021 with stable meningioma with no new lesions.  She will have follow-up MRI in January.  3.  Unprovoked pulmonary embolism: - Continue Eliquis 5 mg twice daily indefinitely.  4.  Leg swellings: - Continue Lasix 20 mg daily as needed.  5.  Dry eyes with  itching: - We have ordered Restasis at last visit which has high co-pay. - I have recommended her to use refresh eyedrops.   Orders placed this encounter:  No orders of the defined types were placed in this encounter.     Derek Jack, MD [place] [phone]

## 2022-03-17 ENCOUNTER — Encounter: Payer: Self-pay | Admitting: Hematology

## 2022-03-28 ENCOUNTER — Other Ambulatory Visit: Payer: Self-pay

## 2022-03-28 DIAGNOSIS — C3492 Malignant neoplasm of unspecified part of left bronchus or lung: Secondary | ICD-10-CM

## 2022-03-29 ENCOUNTER — Other Ambulatory Visit: Payer: Medicare HMO

## 2022-03-29 ENCOUNTER — Ambulatory Visit: Payer: Medicare HMO

## 2022-03-29 ENCOUNTER — Ambulatory Visit: Payer: Medicare HMO | Admitting: Hematology

## 2022-03-29 DIAGNOSIS — I872 Venous insufficiency (chronic) (peripheral): Secondary | ICD-10-CM | POA: Diagnosis not present

## 2022-03-30 ENCOUNTER — Inpatient Hospital Stay: Payer: Medicare HMO | Admitting: Hematology

## 2022-03-30 ENCOUNTER — Encounter: Payer: Self-pay | Admitting: Hematology

## 2022-03-30 ENCOUNTER — Inpatient Hospital Stay: Payer: Medicare HMO

## 2022-03-30 ENCOUNTER — Inpatient Hospital Stay: Payer: Medicare HMO | Attending: Neurosurgery

## 2022-03-30 VITALS — BP 110/68 | HR 86 | Temp 98.2°F | Resp 20

## 2022-03-30 VITALS — BP 130/82 | HR 88 | Temp 96.4°F | Resp 20 | Ht 62.0 in | Wt 241.0 lb

## 2022-03-30 DIAGNOSIS — C787 Secondary malignant neoplasm of liver and intrahepatic bile duct: Secondary | ICD-10-CM | POA: Insufficient documentation

## 2022-03-30 DIAGNOSIS — Z5112 Encounter for antineoplastic immunotherapy: Secondary | ICD-10-CM | POA: Diagnosis not present

## 2022-03-30 DIAGNOSIS — C3492 Malignant neoplasm of unspecified part of left bronchus or lung: Secondary | ICD-10-CM | POA: Diagnosis not present

## 2022-03-30 DIAGNOSIS — Z95828 Presence of other vascular implants and grafts: Secondary | ICD-10-CM

## 2022-03-30 DIAGNOSIS — R21 Rash and other nonspecific skin eruption: Secondary | ICD-10-CM | POA: Diagnosis not present

## 2022-03-30 DIAGNOSIS — Z5111 Encounter for antineoplastic chemotherapy: Secondary | ICD-10-CM | POA: Diagnosis not present

## 2022-03-30 LAB — CBC WITH DIFFERENTIAL/PLATELET
Abs Immature Granulocytes: 0.01 10*3/uL (ref 0.00–0.07)
Basophils Absolute: 0 10*3/uL (ref 0.0–0.1)
Basophils Relative: 1 %
Eosinophils Absolute: 0.1 10*3/uL (ref 0.0–0.5)
Eosinophils Relative: 3 %
HCT: 33.5 % — ABNORMAL LOW (ref 36.0–46.0)
Hemoglobin: 10.1 g/dL — ABNORMAL LOW (ref 12.0–15.0)
Immature Granulocytes: 0 %
Lymphocytes Relative: 15 %
Lymphs Abs: 0.7 10*3/uL (ref 0.7–4.0)
MCH: 29.3 pg (ref 26.0–34.0)
MCHC: 30.1 g/dL (ref 30.0–36.0)
MCV: 97.1 fL (ref 80.0–100.0)
Monocytes Absolute: 0.4 10*3/uL (ref 0.1–1.0)
Monocytes Relative: 9 %
Neutro Abs: 3.3 10*3/uL (ref 1.7–7.7)
Neutrophils Relative %: 72 %
Platelets: 315 10*3/uL (ref 150–400)
RBC: 3.45 MIL/uL — ABNORMAL LOW (ref 3.87–5.11)
RDW: 13.7 % (ref 11.5–15.5)
WBC: 4.5 10*3/uL (ref 4.0–10.5)
nRBC: 0 % (ref 0.0–0.2)

## 2022-03-30 LAB — COMPREHENSIVE METABOLIC PANEL
ALT: 18 U/L (ref 0–44)
AST: 26 U/L (ref 15–41)
Albumin: 3.1 g/dL — ABNORMAL LOW (ref 3.5–5.0)
Alkaline Phosphatase: 86 U/L (ref 38–126)
Anion gap: 8 (ref 5–15)
BUN: 10 mg/dL (ref 8–23)
CO2: 26 mmol/L (ref 22–32)
Calcium: 8.6 mg/dL — ABNORMAL LOW (ref 8.9–10.3)
Chloride: 106 mmol/L (ref 98–111)
Creatinine, Ser: 0.88 mg/dL (ref 0.44–1.00)
GFR, Estimated: >60 mL/min (ref >60)
Glucose, Bld: 99 mg/dL (ref 70–99)
Potassium: 3.9 mmol/L (ref 3.5–5.1)
Sodium: 140 mmol/L (ref 135–145)
Total Bilirubin: 0.2 mg/dL — ABNORMAL LOW (ref 0.3–1.2)
Total Protein: 6.8 g/dL (ref 6.5–8.1)

## 2022-03-30 LAB — MAGNESIUM: Magnesium: 2 mg/dL (ref 1.7–2.4)

## 2022-03-30 MED ORDER — CYANOCOBALAMIN 1000 MCG/ML IJ SOLN
1000.0000 ug | Freq: Once | INTRAMUSCULAR | Status: AC
  Administered 2022-03-30: 1000 ug via INTRAMUSCULAR
  Filled 2022-03-30: qty 1

## 2022-03-30 MED ORDER — SODIUM CHLORIDE 0.9 % IV SOLN: Freq: Once | INTRAVENOUS | Status: AC

## 2022-03-30 MED ORDER — ONDANSETRON HCL 4 MG/2ML IJ SOLN
8.0000 mg | Freq: Once | INTRAMUSCULAR | Status: DC
  Filled 2022-03-30: qty 4

## 2022-03-30 MED ORDER — ONDANSETRON HCL 4 MG/2ML IJ SOLN
8.0000 mg | Freq: Once | INTRAMUSCULAR | Status: AC
  Administered 2022-03-30: 8 mg via INTRAVENOUS

## 2022-03-30 MED ORDER — SODIUM CHLORIDE 0.9 % IV SOLN
500.0000 mg/m2 | Freq: Once | INTRAVENOUS | Status: AC
  Administered 2022-03-30: 1100 mg via INTRAVENOUS
  Filled 2022-03-30: qty 40

## 2022-03-30 MED ORDER — HEPARIN SOD (PORK) LOCK FLUSH 100 UNIT/ML IV SOLN
500.0000 [IU] | Freq: Once | INTRAVENOUS | Status: AC | PRN
  Administered 2022-03-30: 500 [IU]

## 2022-03-30 MED ORDER — SODIUM CHLORIDE 0.9 % IV SOLN
200.0000 mg | Freq: Once | INTRAVENOUS | Status: AC
  Administered 2022-03-30: 200 mg via INTRAVENOUS
  Filled 2022-03-30: qty 8

## 2022-03-30 MED ORDER — SODIUM CHLORIDE 0.9% FLUSH
10.0000 mL | Freq: Once | INTRAVENOUS | Status: AC
  Administered 2022-03-30: 10 mL via INTRAVENOUS

## 2022-03-30 NOTE — Patient Instructions (Signed)
[facility]  Discharge Instructions: Thank you for choosing [facility] to provide your oncology and hematology care.  If you have a lab appointment with the [facility], please come in thru the Main Entrance and check in at the main information desk.  Wear comfortable clothing and clothing appropriate for easy access to any Portacath or PICC line.   We strive to give you quality time with your provider. You may need to reschedule your appointment if you arrive late (15 or more minutes).  Arriving late affects you and other patients whose appointments are after yours.  Also, if you miss three or more appointments without notifying the office, you may be dismissed from the clinic at the provider's discretion.      For prescription refill requests, have your pharmacy contact our office and allow 72 hours for refills to be completed.    Today you received the following chemotherapy and/or immunotherapy agents: Keytruda and Alimta      To help prevent nausea and vomiting after your treatment, we encourage you to take your nausea medication as directed.  BELOW ARE SYMPTOMS THAT SHOULD BE REPORTED IMMEDIATELY: *FEVER GREATER THAN 100.4 F (38 C) OR HIGHER *CHILLS OR SWEATING *NAUSEA AND VOMITING THAT IS NOT CONTROLLED WITH YOUR NAUSEA MEDICATION *UNUSUAL SHORTNESS OF BREATH *UNUSUAL BRUISING OR BLEEDING *URINARY PROBLEMS (pain or burning when urinating, or frequent urination) *BOWEL PROBLEMS (unusual diarrhea, constipation, pain near the anus) TENDERNESS IN MOUTH AND THROAT WITH OR WITHOUT PRESENCE OF ULCERS (sore throat, sores in mouth, or a toothache) UNUSUAL RASH, SWELLING OR PAIN  UNUSUAL VAGINAL DISCHARGE OR ITCHING   Items with * indicate a potential emergency and should be followed up as soon as possible or go to the Emergency Department if any problems should occur.  Please show the CHEMOTHERAPY ALERT CARD or IMMUNOTHERAPY ALERT CARD at check-in to  the Emergency Department and triage nurse.  Should you have questions after your visit or need to cancel or reschedule your appointment, please contact [facility] [phone]  and follow the prompts.  Office hours are 8:00 a.m. to 4:30 p.m. Monday - Friday. Please note that voicemails left after 4:00 p.m. may not be returned until the following business day.  We are closed weekends and major holidays. You have access to a nurse at all times for urgent questions. Please call the main number to the clinic [phone] and follow the prompts.  For any non-urgent questions, you may also contact your provider using MyChart. We now offer e-Visits for anyone 29 and older to request care online for non-urgent symptoms. For details visit [email or web address].   Also download the MyChart app! Go to the app store, search "MyChart", open the app, select [facility], and log in with your MyChart username and password.

## 2022-03-30 NOTE — Patient Instructions (Addendum)
[facility] at [facility] Discharge Instructions   You were seen and examined today by Dr. Delton Coombes.  The results of your lab work today are currently pending.  We will proceed with your treatment today pending lab results.   Return as scheduled.    Thank you for choosing [facility] at [facility] to provide your oncology and hematology care.  To afford each patient quality time with our provider, please arrive at least 15 minutes before your scheduled appointment time.   If you have a lab appointment with the [facility] please come in thru the Main Entrance and check in at the main information desk.  You need to re-schedule your appointment should you arrive 10 or more minutes late.  We strive to give you quality time with our providers, and arriving late affects you and other patients whose appointments are after yours.  Also, if you no show three or more times for appointments you may be dismissed from the clinic at the providers discretion.     Again, thank you for choosing [facility].  Our hope is that these requests will decrease the amount of time that you wait before being seen by our physicians.       _____________________________________________________________  Should you have questions after your visit to [facility], please contact our office at [phone] and follow the prompts.  Our office hours are 8:00 a.m. and 4:30 p.m. Monday - Friday.  Please note that voicemails left after 4:00 p.m. may not be returned until the following business day.  We are closed weekends and major holidays.  You do have access to a nurse 24-7, just call the main number to the clinic [phone] and do not press any options, hold on the line and a nurse will answer the phone.    For prescription refill requests, have your pharmacy contact our office and allow 72 hours.    Due to Covid, you will need to wear a mask upon  entering the hospital. If you do not have a mask, a mask will be given to you at the Main Entrance upon arrival. For doctor visits, patients may have 1 support person age 92 or older with them. For treatment visits, patients can not have anyone with them due to social distancing guidelines and our immunocompromised population.

## 2022-03-30 NOTE — Progress Notes (Signed)
Patient has been seen today by Dr. Delton Coombes. She is okay to proceed with treatment.  She reports that she saw a dermatologist yesterday that gave her some new cream for rash on her legs, Dr. Delton Coombes aware.

## 2022-03-30 NOTE — Progress Notes (Signed)
[name], [place] [address]   CLINIC:  Medical Oncology/Hematology  PCP:  Iona Beard, [address] / [place] [address] [phone]   REASON FOR VISIT:  Follow-up for stage IV adenocarcinoma of the lung, negative for targetable mutations  PRIOR THERAPY:  1. Chemoradiation with carboplatin and paclitaxel from 01/07/2019 to 02/11/2019. 2. Consolidation with durvalumab from 03/19/2019 to 04/16/2019, held due to pneumonitis.  NGS Results: not done  CURRENT THERAPY: Pemetrexed and pembrolizumab maintenance  BRIEF ONCOLOGIC HISTORY:  Oncology History  Adenocarcinoma of left lung ([facility])  12/09/2018 Initial Diagnosis   Adenocarcinoma of left lung ([facility])   01/02/2019 Cancer Staging   Staging form: Lung, AJCC 8th Edition - Clinical stage from 01/02/2019: Stage IIIB (cT3, cN2, cM0) - Signed by Derek Jack, MD on 01/02/2019   01/07/2019 - 02/11/2019 Chemotherapy   The patient had palonosetron (ALOXI) injection 0.25 mg, 0.25 mg, Intravenous,  Once, 6 of 6 cycles Administration: 0.25 mg (01/07/2019), 0.25 mg (01/14/2019), 0.25 mg (01/21/2019), 0.25 mg (01/28/2019), 0.25 mg (02/04/2019), 0.25 mg (02/11/2019) CARBOplatin (PARAPLATIN) 270 mg in sodium chloride 0.9 % 250 mL chemo infusion, 270 mg (100 % of original dose 266.4 mg), Intravenous,  Once, 6 of 6 cycles Dose modification:   (original dose 266.4 mg, Cycle 1),   (original dose 266.4 mg, Cycle 2),   (original dose 266.4 mg, Cycle 3),   (original dose 266.4 mg, Cycle 4) Administration: 270 mg (01/07/2019), 270 mg (01/14/2019), 270 mg (01/21/2019), 270 mg (01/28/2019), 270 mg (02/04/2019), 270 mg (02/11/2019) PACLitaxel (TAXOL) 108 mg in sodium chloride 0.9 % 250 mL chemo infusion (</= 80mg /m2), 45 mg/m2 = 108 mg, Intravenous,  Once, 6 of 6 cycles Administration: 108 mg (01/07/2019), 108 mg (01/14/2019), 108 mg (01/21/2019), 108 mg (01/28/2019), 108 mg (02/04/2019), 108 mg  (02/11/2019) fosaprepitant (EMEND) 150 mg, dexamethasone (DECADRON) 12 mg in sodium chloride 0.9 % 145 mL IVPB, , Intravenous,  Once, 5 of 5 cycles Administration:  (01/14/2019),  (01/21/2019),  (01/28/2019),  (02/04/2019),  (02/11/2019)  for chemotherapy treatment.    03/19/2019 - 07/23/2020 Chemotherapy   Patient is on Treatment Plan : LUNG DURVALUMAB R51O     09/22/2021 - 11/04/2021 Chemotherapy   Patient is on Treatment Plan : LUNG Carboplatin (5) + Pemetrexed (500) + Pembrolizumab (200) D1 q21d Induction x 4 cycles / Maintenance Pemetrexed (500) + Pembrolizumab (200) D1 q21d     09/22/2021 -  Chemotherapy   Patient is on Treatment Plan : LUNG Carboplatin (5) + Pemetrexed (500) + Pembrolizumab (200) D1 q21d Induction x 4 cycles / Maintenance Pemetrexed (500) + Pembrolizumab (200) D1 q21d       CANCER STAGING:  Cancer Staging  Adenocarcinoma of left lung ([facility]) Staging form: Lung, AJCC 8th Edition - Clinical stage from 01/02/2019: Stage IIIB (cT3, cN2, cM0) - Signed by Derek Jack, MD on 01/02/2019 - Pathologic stage from 09/07/2021: Stage IVA (pTX, pNX, pM1b) - Unsigned   INTERVAL HISTORY:  [name], a 72 y.o. female, seen for follow-up of metastatic lung cancer.  We held her treatment 2 weeks ago as she developed hyperpigmented rash on the right lower extremity with swelling.  She was evaluated at Dr. Juel Burrow clinic again yesterday and was prescribed betamethasone dipropionate 0.05% cream.   REVIEW OF SYSTEMS:  Review of Systems  Respiratory:  Positive for shortness of breath (On exertion).   Cardiovascular:  Positive for leg swelling.  Musculoskeletal:  Positive for arthralgias (7/10 R  knee).  Skin:  Positive for rash (Right leg hyperpigmentation).  All other systems reviewed and are negative.   PAST MEDICAL/SURGICAL HISTORY:  Past Medical History:  Diagnosis Date   Anemia    Aortic stenosis    Arthritis    Coronary artery calcification seen on CT scan     Essential hypertension    GERD (gastroesophageal reflux disease)    History of lung cancer    Stage III adenocarcinoma status post chemoradiation   Port-A-Cath in place 01/06/2019   Type 2 diabetes mellitus ([facility])    Past Surgical History:  Procedure Laterality Date   CHOLECYSTECTOMY  1997   COLONOSCOPY     GASTRIC BYPASS     INCISIONAL HERNIA REPAIR  04/11/11   IR IMAGING GUIDED PORT INSERTION  12/27/2018   Right   IR US GUIDE BX ASP/DRAIN  08/08/2021   LAPAROSCOPIC SALPINGOOPHERECTOMY     LAPAROTOMY  04/11/2011   Procedure: EXPLORATORY LAPAROTOMY;  Surgeon: Joyice Faster. Cornett, MD;  Location: [facility];  Service: General;  Laterality: N/A;  closure port hole   RIGHT/LEFT HEART CATH AND CORONARY ANGIOGRAPHY N/A 12/29/2019   Procedure: RIGHT/LEFT HEART CATH AND CORONARY ANGIOGRAPHY;  Surgeon: Troy Sine, MD;  Location: [facility];  Service: Cardiovascular;  Laterality: N/A;   TOTAL HIP ARTHROPLASTY  03/07/2012   Procedure: TOTAL HIP ARTHROPLASTY ANTERIOR APPROACH;  Surgeon: Mauri Pole, MD;  Location: [facility];  Service: Orthopedics;  Laterality: Right;   TOTAL SHOULDER ARTHROPLASTY Left 01/21/2015   TOTAL SHOULDER ARTHROPLASTY Left 01/21/2015   Procedure: LEFT TOTAL SHOULDER ARTHROPLASTY;  Surgeon: Justice Britain, MD;  Location: [facility];  Service: Orthopedics;  Laterality: Left;   VAGINAL HYSTERECTOMY      SOCIAL HISTORY:  Social History   Socioeconomic History   Marital status: Single    Spouse name: Not on file   Number of children: Not on file   Years of education: 12th grade   Highest education level: Not on file  Occupational History   Occupation: Employed    Employer: [employer]  Tobacco Use   Smoking status: Former    Packs/day: 1.00    Years: 12.00    Total pack years: 12.00    Types: Cigarettes    Quit date: 03/20/1976    Years since quitting: 46.0   Smokeless tobacco: Never  Vaping Use   Vaping Use: Never used  Substance and Sexual Activity    Alcohol use: No   Drug use: No   Sexual activity: Not Currently  Other Topics Concern   Not on file  Social History Narrative   Not on file   Social Determinants of Health   Financial Resource Strain: Low Risk  (12/06/2018)   Overall Financial Resource Strain (CARDIA)    Difficulty of Paying Living Expenses: Not very hard  Food Insecurity: No Food Insecurity (12/06/2018)   Hunger Vital Sign    Worried About Running Out of Food in the Last Year: Never true    Ran Out of Food in the Last Year: Never true  Transportation Needs: No Transportation Needs (12/06/2018)   PRAPARE - Hydrologist (Medical): No    Lack of Transportation (Non-Medical): No  Physical Activity: Inactive (12/06/2018)   Exercise Vital Sign    Days of Exercise per Week: 0 days    Minutes of Exercise per Session: 0 min  Stress: No Stress Concern Present (12/06/2018)   [facility]  Stress Questionnaire    Feeling of Stress : Only a little  Social Connections: Moderately Isolated (12/06/2018)   Social Connection and Isolation Panel [NHANES]    Frequency of Communication with Friends and Family: More than three times a week    Frequency of Social Gatherings with Friends and Family: Once a week    Attends Religious Services: More than 4 times per year    Active Member of [employer] or Organizations: No    Attends Archivist Meetings: Never    Marital Status: Never married  Intimate Partner Violence: Not At Risk (12/06/2018)   Humiliation, Afraid, Rape, and Kick questionnaire    Fear of Current or Ex-Partner: No    Emotionally Abused: No    Physically Abused: No    Sexually Abused: No    FAMILY HISTORY:  Family History  Problem Relation Age of Onset   Breast cancer Mother    COPD Mother    Arthritis Mother    Diabetes Mother    Hypertension Mother    Hypertension Father    Diabetes Father    Breast cancer Sister    Thyroid cancer  Brother    Huntington's disease Maternal Grandmother    Heart attack Brother     CURRENT MEDICATIONS:  Current Outpatient Medications  Medication Sig Dispense Refill   apixaban (ELIQUIS) 5 MG TABS tablet Take 1 tablet (5 mg total) by mouth 2 (two) times daily. 60 tablet 6   augmented betamethasone dipropionate (DIPROLENE-AF) 0.05 % cream Apply 1 Application topically 2 (two) times daily.     Calcium Carbonate Antacid (TUMS PO) Take 4 tablets by mouth daily.     carvedilol (COREG) 3.125 MG tablet TAKE 1 TABLET BY MOUTH 2 TIMES DAILY. 180 tablet 1   Cyanocobalamin (VITAMIN B-12) 2500 MCG SUBL Place 2,500 mcg under the tongue every morning.      diphenhydrAMINE (BENADRYL) 25 MG tablet Take 25 mg by mouth every 6 (six) hours as needed for itching or allergies.     ferrous sulfate 325 (65 FE) MG tablet Take 325 mg by mouth every evening.     fluticasone (FLONASE) 50 MCG/ACT nasal spray Place 1 spray into both nostrils 2 (two) times daily. 16 g 2   folic acid (FOLVITE) 1 MG tablet TAKE 1 TABLET BY MOUTH EVERY DAY 90 tablet 1   furosemide (LASIX) 20 MG tablet Take 20 mg by mouth 2 (two) times daily.     gabapentin (NEURONTIN) 300 MG capsule Take 300 mg by mouth 2 (two) times daily.     Multiple Vitamin (MULITIVITAMIN WITH MINERALS) TABS Take 1 tablet by mouth daily.     omeprazole (PRILOSEC) 20 MG capsule Take 20 mg by mouth daily.     Potassium Chloride ER 20 MEQ TBCR Take 1 tablet by mouth 2 (two) times daily.     No current facility-administered medications for this visit.   Facility-Administered Medications Ordered in Other Visits  Medication Dose Route Frequency Provider Last Rate Last Admin   magnesium sulfate 2 GM/50ML IVPB             ALLERGIES:  No Known Allergies  PHYSICAL EXAM:  Performance status (ECOG): 1 - Symptomatic but completely ambulatory  There were no vitals filed for this visit.  Wt Readings from Last 3 Encounters:  03/30/22 241 lb (109.3 kg)  03/16/22 239 lb  9.6 oz (108.7 kg)  02/23/22 249 lb 1.9 oz (113 kg)   Physical Exam Vitals reviewed.  Constitutional:  Appearance: Normal appearance.     Comments: In wheelchair  Cardiovascular:     Rate and Rhythm: Normal rate and regular rhythm.     Pulses: Normal pulses.     Heart sounds: Murmur heard.     Systolic murmur is present.  Pulmonary:     Effort: Pulmonary effort is normal.     Breath sounds: Normal breath sounds.  Musculoskeletal:     Right lower leg: No edema.     Left lower leg: No edema.  Neurological:     General: No focal deficit present.     Mental Status: She is alert and oriented to person, place, and time.  Psychiatric:        Mood and Affect: Mood normal.        Behavior: Behavior normal.    LABORATORY DATA:  I have reviewed the labs as listed.     Latest Ref Rng & Units 03/16/2022   10:04 AM 02/23/2022    9:28 AM 02/02/2022    8:55 AM  CBC  WBC 4.0 - 10.5 K/uL 4.3  5.0  3.6   Hemoglobin 12.0 - 15.0 g/dL 9.5  9.4  9.9   Hematocrit 36.0 - 46.0 % 31.5  31.3  31.8   Platelets 150 - 400 K/uL 397  388  332       Latest Ref Rng & Units 03/16/2022   10:04 AM 02/23/2022    9:28 AM 02/02/2022    8:55 AM  CMP  Glucose 70 - 99 mg/dL 99  106  99   BUN 8 - 23 mg/dL 9  7  7    Creatinine 0.44 - 1.00 mg/dL 0.83  0.77  0.84   Sodium 135 - 145 mmol/L 140  139  142   Potassium 3.5 - 5.1 mmol/L 3.9  3.5  3.7   Chloride 98 - 111 mmol/L 108  107  108   CO2 22 - 32 mmol/L 25  25  26    Calcium 8.9 - 10.3 mg/dL 8.7  8.6  8.6   Total Protein 6.5 - 8.1 g/dL 6.5  6.3  6.2   Total Bilirubin 0.3 - 1.2 mg/dL 0.2  0.5  0.5   Alkaline Phos 38 - 126 U/L 82  82  74   AST 15 - 41 U/L 24  22  23    ALT 0 - 44 U/L 18  17  22      DIAGNOSTIC IMAGING:  I have independently reviewed the scans and discussed with the patient. MM 3D SCREEN BREAST BILATERAL  Result Date: 03/02/2022 CLINICAL DATA:  Screening. EXAM: DIGITAL SCREENING BILATERAL MAMMOGRAM WITH TOMOSYNTHESIS AND CAD  TECHNIQUE: Bilateral screening digital craniocaudal and mediolateral oblique mammograms were obtained. Bilateral screening digital breast tomosynthesis was performed. The images were evaluated with computer-aided detection. COMPARISON:  Previous exam(s). ACR Breast Density Category a: The breast tissue is almost entirely fatty. FINDINGS: There are no findings suspicious for malignancy. IMPRESSION: No mammographic evidence of malignancy. A result letter of this screening mammogram will be mailed directly to the patient. RECOMMENDATION: Screening mammogram in one year. (Code:SM-B-01Y) BI-RADS CATEGORY  1: Negative. Electronically Signed   By: Evangeline Dakin M.D.   On: 03/02/2022 16:21     ASSESSMENT:  1.  Adenocarcinoma of left lung ([facility]) -Chemoradiation therapy with carboplatin and paclitaxel from 01/07/2019 through 02/11/2019. -Consolidation immunotherapy with durvalumab from 03/19/2019 through 04/15/2018, held due to pneumonitis. -CT chest on 07/02/2019 shows left upper lobe lung mass measuring 2.6 x 2.0 cm.  It shows improvement in size. -MRI of the brain on 07/18/2019 showed left sphenoid wing meningioma measuring 2.6 x 2.0 x 2.6 cm unchanged.  No new enhancing intracranial lesion.  Increased left temporal white matter edema. -Durvalumab restarted on 07/24/2019. -She was evaluated by cardiology with a stress test.  EF was 46%.  She underwent cardiac catheterization which did not show any abnormalities. - 1 year of durvalumab completed on 07/23/2020. - Liver mass biopsy (08/08/2021): Adenocarcinoma, CK7 positive, negative for TTF-1, Napsin a, GATA3, ER, CK20, CDX2 - PET scan (08/25/2021): Right upper lobe lung nodule 9 mm, SUV 5.0.  Groundglass and solid nodule periphery of the right upper lobe 8 mm, SUV 2.45.  Right lobe liver mass 4.5 x 3.6 cm, SUV 7.78. - NGS testing: PD-L1 negative, TMB-low, MSI-stable, K-ras G12 D.  No other targetable mutations. - Carboplatin, pemetrexed and pembrolizumab 4 cycles from  09/22/2021 through 11/25/2021, followed by maintenance pemetrexed and pembrolizumab -  CT CAP (11/10/2021): Reduced size of liver mass and interval resolution of previously seen groundglass pulmonary nodules.  There is a new inflammatory right upper lobe nodule.  PLAN:  1.  Metastatic adenocarcinoma of the lung to the liver and right lung: - CT CAP (01/31/2022): Stable postradiation like fibrosis in the left lung.  Previously noted right upper lobe lung nodule is smaller indicating infectious/inflammatory etiology.  Metastatic lesions in the right lower lobe of liver appears to be similar. - Her right leg rash is better and not itching.  She was evaluated by dermatology again yesterday and was started on betamethasone dipropionate 0.05% cream.  She was told that the swelling was due to "poor circulation". - If there is any worsening, will make referral to vascular surgery. - We reviewed labs today which showed normal LFTs and creatinine.  Electrolytes are normal.  CBC was grossly normal.  She will proceed with her treatment today.  I will reevaluate her in 3 weeks.  Will likely repeat scans in 6 weeks.   2.  Meningioma: -MRI of the brain on 09/27/2021 with stable meningioma no new lesions.  She will have follow-up MRI in January.  3.  Unprovoked pulmonary embolism: - Continue Eliquis 5 mg twice daily indefinitely.  4.  Leg swellings: - Continue Lasix 40 mg daily.  Continue potassium 40 mEq daily.  Potassium today is 3.9.  5.  Dry eyes with itching: - Itching has improved when she started using refresh eyedrops.   Orders placed this encounter:  No orders of the defined types were placed in this encounter.     Derek Jack, MD [place] [phone]

## 2022-03-30 NOTE — Progress Notes (Signed)
Patient remained stable during her treatment and tolerated it without incidence.  She was discharged via wheelchair in stable condition.  She is aware of her upcoming appointments.

## 2022-03-31 ENCOUNTER — Other Ambulatory Visit: Payer: Self-pay

## 2022-04-03 ENCOUNTER — Ambulatory Visit (HOSPITAL_COMMUNITY)
Admission: RE | Admit: 2022-04-03 | Discharge: 2022-04-03 | Disposition: A | Discharge status: Home / Self Care | Payer: Medicare HMO | Source: Ambulatory Visit | Attending: Neurosurgery | Admitting: Neurosurgery

## 2022-04-03 DIAGNOSIS — D329 Benign neoplasm of meninges, unspecified: Secondary | ICD-10-CM | POA: Insufficient documentation

## 2022-04-03 DIAGNOSIS — G936 Cerebral edema: Secondary | ICD-10-CM | POA: Diagnosis not present

## 2022-04-03 MED ORDER — GADOBUTROL 1 MMOL/ML IV SOLN
10.0000 mL | Freq: Once | INTRAVENOUS | Status: AC | PRN
  Administered 2022-04-03: 10 mL via INTRAVENOUS

## 2022-04-04 ENCOUNTER — Other Ambulatory Visit: Payer: Self-pay

## 2022-04-10 DIAGNOSIS — D329 Benign neoplasm of meninges, unspecified: Secondary | ICD-10-CM | POA: Diagnosis not present

## 2022-04-10 DIAGNOSIS — Z6841 Body Mass Index (BMI) 40.0 and over, adult: Secondary | ICD-10-CM | POA: Diagnosis not present

## 2022-04-19 NOTE — Progress Notes (Unsigned)
Cardiology Office Note  Date: 04/20/2022   ID: Denise [name], DOB [birth year], MRN [id number]  PCP:  Iona Beard, MD  Cardiologist:  Rozann Lesches, MD Electrophysiologist:  None   Chief Complaint  Patient presents with   Cardiac follow-up    History of Present Illness: Denise [name] is a 72 y.o. female last seen in August 2023.  She is here for a follow-up visit.  Reports NYHA class III dyspnea which is unchanged.  No exertional chest pain or syncope.  Weight has been stable.  She continues to follow with Dr. Delton Coombes for treatment of stage IV adenocarcinoma of the lung.  Last echocardiogram was in May 2023 is noted below.  She has moderate aortic stenosis with mean AV gradient 28 mmHg and dimensionless index 0.44 at that time.  She also has severe pulmonary hypertension that is most likely WHO group 3 or 4 in the setting of her lung disease and also history of pulmonary embolus.  I reviewed her medications which are noted below.  She does not report any spontaneous bleeding problems on Eliquis.  Recent lab work reviewed as well.  Past Medical History:  Diagnosis Date   Anemia    Aortic stenosis    Arthritis    Coronary artery calcification seen on CT scan    Essential hypertension    GERD (gastroesophageal reflux disease)    History of lung cancer    Stage III adenocarcinoma status post chemoradiation   Port-A-Cath in place 01/06/2019   Type 2 diabetes mellitus ([facility])     Current Outpatient Medications  Medication Sig Dispense Refill   apixaban (ELIQUIS) 5 MG TABS tablet Take 1 tablet (5 mg total) by mouth 2 (two) times daily. 60 tablet 6   augmented betamethasone dipropionate (DIPROLENE-AF) 0.05 % cream Apply 1 Application topically 2 (two) times daily.     Calcium Carbonate Antacid (TUMS PO) Take 4 tablets by mouth daily.     carvedilol (COREG) 3.125 MG tablet TAKE 1 TABLET BY MOUTH 2 TIMES DAILY. 180 tablet 1   Cyanocobalamin (VITAMIN B-12) 2500 MCG SUBL Place 2,500 mcg  under the tongue every morning.      diphenhydrAMINE (BENADRYL) 25 MG tablet Take 25 mg by mouth every 6 (six) hours as needed for itching or allergies.     ferrous sulfate 325 (65 FE) MG tablet Take 325 mg by mouth every evening.     fluticasone (FLONASE) 50 MCG/ACT nasal spray Place 1 spray into both nostrils 2 (two) times daily. 16 g 2   folic acid (FOLVITE) 1 MG tablet TAKE 1 TABLET BY MOUTH EVERY DAY 90 tablet 1   furosemide (LASIX) 20 MG tablet Take 20 mg by mouth 2 (two) times daily.     gabapentin (NEURONTIN) 300 MG capsule Take 300 mg by mouth 2 (two) times daily.     Multiple Vitamin (MULITIVITAMIN WITH MINERALS) TABS Take 1 tablet by mouth daily.     omeprazole (PRILOSEC) 20 MG capsule Take 20 mg by mouth daily.     Potassium Chloride ER 20 MEQ TBCR Take 1 tablet by mouth 2 (two) times daily.     No current facility-administered medications for this visit.   Facility-Administered Medications Ordered in Other Visits  Medication Dose Route Frequency Provider Last Rate Last Admin   magnesium sulfate 2 GM/50ML IVPB            Allergies:  Patient has no known allergies.   ROS: No palpitations.  Physical Exam: VS:  BP  102/70   Pulse 86   Ht 5\' 2"  (1.575 m)   Wt 240 lb (108.9 kg)   SpO2 92%   BMI 43.90 kg/m , BMI Body mass index is 43.9 kg/m.  Wt Readings from Last 3 Encounters:  04/20/22 240 lb (108.9 kg)  03/30/22 241 lb (109.3 kg)  03/16/22 239 lb 9.6 oz (108.7 kg)    General: Patient appears comfortable at rest. HEENT: Conjunctiva and lids normal. Neck: Supple, no elevated JVP or carotid bruits. Lungs: Clear to auscultation, nonlabored breathing at rest. Cardiac: Regular rate and rhythm, no S3, 8-2/9 systolic murmur. Extremities: No pitting edema.  ECG:  An ECG dated 07/17/2021 was personally reviewed today and demonstrated:  Sinus rhythm with right bundle branch block and left anterior fascicular block.  Recent Labwork: 07/17/2021: B Natriuretic Peptide  554.0 03/16/2022: TSH 1.385 03/30/2022: ALT 18; AST 26; BUN 10; Creatinine, Ser 0.88; Hemoglobin 10.1; Magnesium 2.0; Platelets 315; Potassium 3.9; Sodium 140     Component Value Date/Time   CHOL 166 12/15/2021 0754   TRIG 100 12/15/2021 0754   HDL 67 12/15/2021 0754   CHOLHDL 2.5 12/15/2021 0754   VLDL 20 12/15/2021 0754   LDLCALC 79 12/15/2021 0754   Other Studies Reviewed Today:  Echocardiogram 07/18/2021:  1. Left ventricular ejection fraction, by estimation, is 65 to 70%. The  left ventricle has normal function. Left ventricular endocardial border  not optimally defined to evaluate regional wall motion. There is severe  left ventricular hypertrophy. Left  ventricular diastolic parameters are consistent with Grade I diastolic  dysfunction (impaired relaxation).   2. Ventricular septum is flattened in systole consistent with RV pressure  overload. . Right ventricular systolic function moderately to severely  reduced. The right ventricular size is severely enlarged. There is  severely elevated pulmonary artery  systolic pressure.   3. Right atrial size was severely dilated.   4. The mitral valve was not well visualized. No evidence of mitral valve  regurgitation. No evidence of mitral stenosis.   5. The tricuspid valve is abnormal. Tricuspid valve regurgitation is  moderate.   6. The aortic valve has an indeterminant number of cusps. There is  moderate calcification of the aortic valve. There is moderate thickening  of the aortic valve. Aortic valve regurgitation is mild. Moderate aortic  valve stenosis. Aortic valve mean  gradient measures 28.2 mmHg. Aortic valve peak gradient measures 46.9  mmHg. Aortic valve area, by VTI measures 1.25 cm.   7. The inferior vena cava is normal in size with greater than 50%  respiratory variability, suggesting right atrial pressure of 3 mmHg.   Assessment and Plan:  1.  Calcific aortic stenosis, moderate range with mean AV gradient 28 mmHg  and dimensionless index 0.44 by echocardiogram in May of last year.  Will plan on a follow-up echocardiogram this May.  2.  Severe pulmonary hypertension with RV dysfunction, likely WHO group 3 or 4 in the setting of chronic lung disease with stage IV adenocarcinoma and also pulmonary emboli.  She continues treatment through oncology and remains on Eliquis.  3.  Normal coronary arteries at cardiac catheterization in 2021.  Medication Adjustments/Labs and Tests Ordered: Current medicines are reviewed at length with the patient today.  Concerns regarding medicines are outlined above.   Tests Ordered: Orders Placed This Encounter  Procedures   ECHOCARDIOGRAM COMPLETE    Medication Changes: No orders of the defined types were placed in this encounter.   Disposition:  Follow up  6 months.  Signed, Satira Sark, MD, [facility] 04/20/2022 9:58 AM    [place] [facility] [address] [place], [place] [address] Phone: [phone]; Fax: [phone]

## 2022-04-20 ENCOUNTER — Ambulatory Visit: Payer: Medicare HMO | Attending: Cardiology | Admitting: Cardiology

## 2022-04-20 ENCOUNTER — Encounter: Payer: Self-pay | Admitting: Cardiology

## 2022-04-20 VITALS — BP 102/70 | HR 86 | Ht 62.0 in | Wt 240.0 lb

## 2022-04-20 DIAGNOSIS — I272 Pulmonary hypertension, unspecified: Secondary | ICD-10-CM

## 2022-04-20 DIAGNOSIS — I350 Nonrheumatic aortic (valve) stenosis: Secondary | ICD-10-CM | POA: Diagnosis not present

## 2022-04-20 NOTE — Patient Instructions (Signed)
Medication Instructions:  Your physician recommends that you continue on your current medications as directed. Please refer to the Current Medication list given to you today.   Labwork: None today  Testing/Procedures: Echo in MAY   Follow-Up: 6 months Dr.McDowell  Any Other Special Instructions Will Be Listed Below (If Applicable).  If you need a refill on your cardiac medications before your next appointment, please call your pharmacy.

## 2022-04-21 ENCOUNTER — Other Ambulatory Visit: Payer: Self-pay

## 2022-04-24 ENCOUNTER — Encounter: Payer: Self-pay | Admitting: Hematology

## 2022-04-24 ENCOUNTER — Inpatient Hospital Stay: Payer: Medicare HMO | Attending: Neurosurgery

## 2022-04-24 ENCOUNTER — Inpatient Hospital Stay (HOSPITAL_BASED_OUTPATIENT_CLINIC_OR_DEPARTMENT_OTHER): Payer: Medicare HMO | Admitting: Hematology

## 2022-04-24 ENCOUNTER — Inpatient Hospital Stay: Payer: Medicare HMO

## 2022-04-24 VITALS — BP 113/65 | HR 84 | Temp 96.5°F | Resp 20 | Ht 62.0 in | Wt 239.6 lb

## 2022-04-24 VITALS — BP 104/58 | HR 88 | Temp 97.9°F | Resp 18

## 2022-04-24 DIAGNOSIS — Z5112 Encounter for antineoplastic immunotherapy: Secondary | ICD-10-CM | POA: Insufficient documentation

## 2022-04-24 DIAGNOSIS — C3492 Malignant neoplasm of unspecified part of left bronchus or lung: Secondary | ICD-10-CM

## 2022-04-24 DIAGNOSIS — D320 Benign neoplasm of cerebral meninges: Secondary | ICD-10-CM | POA: Insufficient documentation

## 2022-04-24 DIAGNOSIS — Z79899 Other long term (current) drug therapy: Secondary | ICD-10-CM | POA: Insufficient documentation

## 2022-04-24 DIAGNOSIS — H04129 Dry eye syndrome of unspecified lacrimal gland: Secondary | ICD-10-CM | POA: Insufficient documentation

## 2022-04-24 DIAGNOSIS — Z5111 Encounter for antineoplastic chemotherapy: Secondary | ICD-10-CM | POA: Insufficient documentation

## 2022-04-24 DIAGNOSIS — Z79620 Long term (current) use of immunosuppressive biologic: Secondary | ICD-10-CM | POA: Insufficient documentation

## 2022-04-24 DIAGNOSIS — C787 Secondary malignant neoplasm of liver and intrahepatic bile duct: Secondary | ICD-10-CM | POA: Insufficient documentation

## 2022-04-24 DIAGNOSIS — Z95828 Presence of other vascular implants and grafts: Secondary | ICD-10-CM

## 2022-04-24 LAB — CBC WITH DIFFERENTIAL/PLATELET
Abs Immature Granulocytes: 0.01 10*3/uL (ref 0.00–0.07)
Basophils Absolute: 0 10*3/uL (ref 0.0–0.1)
Basophils Relative: 0 %
Eosinophils Absolute: 0.1 10*3/uL (ref 0.0–0.5)
Eosinophils Relative: 2 %
HCT: 33.3 % — ABNORMAL LOW (ref 36.0–46.0)
Hemoglobin: 10.2 g/dL — ABNORMAL LOW (ref 12.0–15.0)
Immature Granulocytes: 0 %
Lymphocytes Relative: 16 %
Lymphs Abs: 0.7 10*3/uL (ref 0.7–4.0)
MCH: 28.9 pg (ref 26.0–34.0)
MCHC: 30.6 g/dL (ref 30.0–36.0)
MCV: 94.3 fL (ref 80.0–100.0)
Monocytes Absolute: 0.6 10*3/uL (ref 0.1–1.0)
Monocytes Relative: 13 %
Neutro Abs: 3.1 10*3/uL (ref 1.7–7.7)
Neutrophils Relative %: 69 %
Platelets: 326 10*3/uL (ref 150–400)
RBC: 3.53 MIL/uL — ABNORMAL LOW (ref 3.87–5.11)
RDW: 15 % (ref 11.5–15.5)
WBC: 4.6 10*3/uL (ref 4.0–10.5)
nRBC: 0 % (ref 0.0–0.2)

## 2022-04-24 LAB — COMPREHENSIVE METABOLIC PANEL
ALT: 17 U/L (ref 0–44)
AST: 25 U/L (ref 15–41)
Albumin: 2.9 g/dL — ABNORMAL LOW (ref 3.5–5.0)
Alkaline Phosphatase: 80 U/L (ref 38–126)
Anion gap: 9 (ref 5–15)
BUN: 11 mg/dL (ref 8–23)
CO2: 25 mmol/L (ref 22–32)
Calcium: 8.8 mg/dL — ABNORMAL LOW (ref 8.9–10.3)
Chloride: 103 mmol/L (ref 98–111)
Creatinine, Ser: 0.98 mg/dL (ref 0.44–1.00)
GFR, Estimated: >60 mL/min (ref >60)
Glucose, Bld: 98 mg/dL (ref 70–99)
Potassium: 4.4 mmol/L (ref 3.5–5.1)
Sodium: 137 mmol/L (ref 135–145)
Total Bilirubin: <0.1 mg/dL — ABNORMAL LOW (ref 0.3–1.2)
Total Protein: 6.4 g/dL — ABNORMAL LOW (ref 6.5–8.1)

## 2022-04-24 LAB — MAGNESIUM: Magnesium: 2 mg/dL (ref 1.7–2.4)

## 2022-04-24 LAB — TSH: TSH: 1.618 u[IU]/mL (ref 0.350–4.500)

## 2022-04-24 MED ORDER — SODIUM CHLORIDE 0.9% FLUSH
10.0000 mL | INTRAVENOUS | Status: DC | PRN
  Administered 2022-04-24: 10 mL

## 2022-04-24 MED ORDER — SODIUM CHLORIDE 0.9 % IV SOLN
500.0000 mg/m2 | Freq: Once | INTRAVENOUS | Status: AC
  Administered 2022-04-24: 1100 mg via INTRAVENOUS
  Filled 2022-04-24: qty 40

## 2022-04-24 MED ORDER — SODIUM CHLORIDE 0.9 % IV SOLN: Freq: Once | INTRAVENOUS | Status: AC

## 2022-04-24 MED ORDER — SODIUM CHLORIDE 0.9% FLUSH
10.0000 mL | INTRAVENOUS | Status: DC | PRN
  Administered 2022-04-24: 10 mL via INTRAVENOUS

## 2022-04-24 MED ORDER — SODIUM CHLORIDE 0.9 % IV SOLN
200.0000 mg | Freq: Once | INTRAVENOUS | Status: AC
  Administered 2022-04-24: 200 mg via INTRAVENOUS
  Filled 2022-04-24: qty 8

## 2022-04-24 MED ORDER — HEPARIN SOD (PORK) LOCK FLUSH 100 UNIT/ML IV SOLN
500.0000 [IU] | Freq: Once | INTRAVENOUS | Status: AC | PRN
  Administered 2022-04-24: 500 [IU]

## 2022-04-24 MED ORDER — ONDANSETRON HCL 4 MG/2ML IJ SOLN
8.0000 mg | Freq: Once | INTRAMUSCULAR | Status: AC
  Administered 2022-04-24: 8 mg via INTRAVENOUS
  Filled 2022-04-24: qty 4

## 2022-04-24 NOTE — Progress Notes (Signed)
Patients port flushed without difficulty.  Good blood return noted with no bruising or swelling noted at site.  Patient remains accessed for chemotherapy treatment.  

## 2022-04-24 NOTE — Progress Notes (Signed)
Patient has been assessed, vital signs and labs have been reviewed by Dr. Katragadda. ANC, Creatinine, LFTs, and Platelets are within treatment parameters per Dr. Katragadda. The patient is good to proceed with treatment at this time. Primary RN and pharmacy aware.  

## 2022-04-24 NOTE — Progress Notes (Signed)
[place], [place] [address]   CLINIC:  Medical Oncology/Hematology  PCP:  Iona Beard, [address] / [place] [address] [phone]   REASON FOR VISIT:  Follow-up for stage IV adenocarcinoma of the left lung, negative for targetable mutations  PRIOR THERAPY:  1. Chemoradiation with carboplatin and paclitaxel from 01/07/2019 to 02/11/2019. 2. Consolidation with durvalumab from 03/19/2019 to 04/16/2019, held due to pneumonitis.  NGS Results: not done  CURRENT THERAPY: Pemetrexed and pembrolizumab maintenance  BRIEF ONCOLOGIC HISTORY:  Oncology History  Adenocarcinoma of left lung ([facility])  12/09/2018 Initial Diagnosis   Adenocarcinoma of left lung ([facility])   01/02/2019 Cancer Staging   Staging form: Lung, AJCC 8th Edition - Clinical stage from 01/02/2019: Stage IIIB (cT3, cN2, cM0) - Signed by Derek Jack, MD on 01/02/2019   01/07/2019 - 02/11/2019 Chemotherapy   The patient had palonosetron (ALOXI) injection 0.25 mg, 0.25 mg, Intravenous,  Once, 6 of 6 cycles Administration: 0.25 mg (01/07/2019), 0.25 mg (01/14/2019), 0.25 mg (01/21/2019), 0.25 mg (01/28/2019), 0.25 mg (02/04/2019), 0.25 mg (02/11/2019) CARBOplatin (PARAPLATIN) 270 mg in sodium chloride 0.9 % 250 mL chemo infusion, 270 mg (100 % of original dose 266.4 mg), Intravenous,  Once, 6 of 6 cycles Dose modification:   (original dose 266.4 mg, Cycle 1),   (original dose 266.4 mg, Cycle 2),   (original dose 266.4 mg, Cycle 3),   (original dose 266.4 mg, Cycle 4) Administration: 270 mg (01/07/2019), 270 mg (01/14/2019), 270 mg (01/21/2019), 270 mg (01/28/2019), 270 mg (02/04/2019), 270 mg (02/11/2019) PACLitaxel (TAXOL) 108 mg in sodium chloride 0.9 % 250 mL chemo infusion (</= 80mg /m2), 45 mg/m2 = 108 mg, Intravenous,  Once, 6 of 6 cycles Administration: 108 mg (01/07/2019), 108 mg (01/14/2019), 108 mg (01/21/2019), 108 mg (01/28/2019), 108 mg (02/04/2019), 108 mg  (02/11/2019) fosaprepitant (EMEND) 150 mg, dexamethasone (DECADRON) 12 mg in sodium chloride 0.9 % 145 mL IVPB, , Intravenous,  Once, 5 of 5 cycles Administration:  (01/14/2019),  (01/21/2019),  (01/28/2019),  (02/04/2019),  (02/11/2019)  for chemotherapy treatment.    03/19/2019 - 07/23/2020 Chemotherapy   Patient is on Treatment Plan : LUNG DURVALUMAB W43X     09/22/2021 - 11/04/2021 Chemotherapy   Patient is on Treatment Plan : LUNG Carboplatin (5) + Pemetrexed (500) + Pembrolizumab (200) D1 q21d Induction x 4 cycles / Maintenance Pemetrexed (500) + Pembrolizumab (200) D1 q21d     09/22/2021 -  Chemotherapy   Patient is on Treatment Plan : LUNG Carboplatin (5) + Pemetrexed (500) + Pembrolizumab (200) D1 q21d Induction x 4 cycles / Maintenance Pemetrexed (500) + Pembrolizumab (200) D1 q21d       CANCER STAGING:  Cancer Staging  Adenocarcinoma of left lung ([facility]) Staging form: Lung, AJCC 8th Edition - Clinical stage from 01/02/2019: Stage IIIB (cT3, cN2, cM0) - Signed by Derek Jack, MD on 01/02/2019 - Pathologic stage from 09/07/2021: Stage IVA (pTX, pNX, pM1b) - Unsigned   INTERVAL HISTORY:  [name], a 72 y.o. female, seen for follow-up of metastatic left lung cancer and toxicity check prior to cycle 10 of chemotherapy. She was last seen by me on 03/30/22.  Today, she states that she is doing well overall. She states that she tolerated her last treatment well without any problems. Her appetite level is at 100%. Her energy level is at 50%. We discussed her MRI brain results from 04/03/22.  She reports DOE which is relieved with her [place] O2  at home. She does have a Hx of COPD. She denies any dry cough or N/V/D.  Her right leg hyperpigmentation and swelling have significantly improved with the betamethasone dipropionate 0.05% cream. She denies any leg pain. She is taking Lasix 20mg  BID.   REVIEW OF SYSTEMS:  Review of Systems  Constitutional:  Positive for fatigue. Negative for  chills and fever.  HENT:   Negative for lump/mass, mouth sores, nosebleeds, sore throat and trouble swallowing.   Eyes:  Negative for eye problems.  Respiratory:  Positive for shortness of breath. Negative for cough.   Cardiovascular:  Negative for chest pain, leg swelling and palpitations.  Gastrointestinal:  Negative for abdominal pain, constipation, diarrhea, nausea and vomiting.  Genitourinary:  Negative for bladder incontinence, difficulty urinating, dysuria, frequency, hematuria and nocturia.   Musculoskeletal:  Negative for arthralgias, back pain, flank pain, myalgias and neck pain.  Skin:  Negative for itching and rash.  Neurological:  Positive for numbness (feet). Negative for dizziness and headaches.  Hematological:  Does not bruise/bleed easily.  Psychiatric/Behavioral:  Negative for depression, sleep disturbance and suicidal ideas. The patient is not nervous/anxious.   All other systems reviewed and are negative.   PAST MEDICAL/SURGICAL HISTORY:  Past Medical History:  Diagnosis Date   Anemia    Aortic stenosis    Arthritis    Coronary artery calcification seen on CT scan    Essential hypertension    GERD (gastroesophageal reflux disease)    History of lung cancer    Stage III adenocarcinoma status post chemoradiation   Port-A-Cath in place 01/06/2019   Type 2 diabetes mellitus ([facility])    Past Surgical History:  Procedure Laterality Date   CHOLECYSTECTOMY  1997   COLONOSCOPY     GASTRIC BYPASS     INCISIONAL HERNIA REPAIR  04/11/11   IR IMAGING GUIDED PORT INSERTION  12/27/2018   Right   IR US GUIDE BX ASP/DRAIN  08/08/2021   LAPAROSCOPIC SALPINGOOPHERECTOMY     LAPAROTOMY  04/11/2011   Procedure: EXPLORATORY LAPAROTOMY;  Surgeon: Joyice Faster. Cornett, MD;  Location: [facility];  Service: General;  Laterality: N/A;  closure port hole   RIGHT/LEFT HEART CATH AND CORONARY ANGIOGRAPHY N/A 12/29/2019   Procedure: RIGHT/LEFT HEART CATH AND CORONARY ANGIOGRAPHY;  Surgeon: Troy Sine, MD;  Location: [facility];  Service: Cardiovascular;  Laterality: N/A;   TOTAL HIP ARTHROPLASTY  03/07/2012   Procedure: TOTAL HIP ARTHROPLASTY ANTERIOR APPROACH;  Surgeon: Mauri Pole, MD;  Location: [facility];  Service: Orthopedics;  Laterality: Right;   TOTAL SHOULDER ARTHROPLASTY Left 01/21/2015   TOTAL SHOULDER ARTHROPLASTY Left 01/21/2015   Procedure: LEFT TOTAL SHOULDER ARTHROPLASTY;  Surgeon: Justice Britain, MD;  Location: [facility];  Service: Orthopedics;  Laterality: Left;   VAGINAL HYSTERECTOMY      SOCIAL HISTORY:  Social History   Socioeconomic History   Marital status: Single    Spouse name: Not on file   Number of children: Not on file   Years of education: 12th grade   Highest education level: Not on file  Occupational History   Occupation: Employed    Employer: [employer]  Tobacco Use   Smoking status: Former    Packs/day: 1.00    Years: 12.00    Total pack years: 12.00    Types: Cigarettes    Quit date: 03/20/1976    Years since quitting: 46.1   Smokeless tobacco: Never  Vaping Use   Vaping Use: Never used  Substance  and Sexual Activity   Alcohol use: No   Drug use: No   Sexual activity: Not Currently  Other Topics Concern   Not on file  Social History Narrative   Not on file   Social Determinants of Health   Financial Resource Strain: Low Risk  (12/06/2018)   Overall Financial Resource Strain (CARDIA)    Difficulty of Paying Living Expenses: Not very hard  Food Insecurity: No Food Insecurity (12/06/2018)   Hunger Vital Sign    Worried About Running Out of Food in the Last Year: Never true    Ran Out of Food in the Last Year: Never true  Transportation Needs: No Transportation Needs (12/06/2018)   PRAPARE - Hydrologist (Medical): No    Lack of Transportation (Non-Medical): No  Physical Activity: Inactive (12/06/2018)   Exercise Vital Sign    Days of Exercise per Week: 0 days    Minutes of  Exercise per Session: 0 min  Stress: No Stress Concern Present (12/06/2018)   [place]    Feeling of Stress : Only a little  Social Connections: Moderately Isolated (12/06/2018)   Social Connection and Isolation Panel [NHANES]    Frequency of Communication with Friends and Family: More than three times a week    Frequency of Social Gatherings with Friends and Family: Once a week    Attends Religious Services: More than 4 times per year    Active Member of [employer] or Organizations: No    Attends Archivist Meetings: Never    Marital Status: Never married  Intimate Partner Violence: Not At Risk (12/06/2018)   Humiliation, Afraid, Rape, and Kick questionnaire    Fear of Current or Ex-Partner: No    Emotionally Abused: No    Physically Abused: No    Sexually Abused: No    FAMILY HISTORY:  Family History  Problem Relation Age of Onset   Breast cancer Mother    COPD Mother    Arthritis Mother    Diabetes Mother    Hypertension Mother    Hypertension Father    Diabetes Father    Breast cancer Sister    Thyroid cancer Brother    Huntington's disease Maternal Grandmother    Heart attack Brother     CURRENT MEDICATIONS:  Current Outpatient Medications  Medication Sig Dispense Refill   apixaban (ELIQUIS) 5 MG TABS tablet Take 1 tablet (5 mg total) by mouth 2 (two) times daily. 60 tablet 6   augmented betamethasone dipropionate (DIPROLENE-AF) 0.05 % cream Apply 1 Application topically 2 (two) times daily.     Calcium Carbonate Antacid (TUMS PO) Take 4 tablets by mouth daily.     carvedilol (COREG) 3.125 MG tablet TAKE 1 TABLET BY MOUTH 2 TIMES DAILY. 180 tablet 1   Cyanocobalamin (VITAMIN B-12) 2500 MCG SUBL Place 2,500 mcg under the tongue every morning.      diphenhydrAMINE (BENADRYL) 25 MG tablet Take 25 mg by mouth every 6 (six) hours as needed for itching or allergies.     ferrous sulfate 325 (65 FE) MG  tablet Take 325 mg by mouth every evening.     fluticasone (FLONASE) 50 MCG/ACT nasal spray Place 1 spray into both nostrils 2 (two) times daily. 16 g 2   folic acid (FOLVITE) 1 MG tablet TAKE 1 TABLET BY MOUTH EVERY DAY 90 tablet 1   furosemide (LASIX) 20 MG tablet Take 20 mg by mouth 2 (  two) times daily.     gabapentin (NEURONTIN) 300 MG capsule Take 300 mg by mouth 2 (two) times daily.     Multiple Vitamin (MULITIVITAMIN WITH MINERALS) TABS Take 1 tablet by mouth daily.     omeprazole (PRILOSEC) 20 MG capsule Take 20 mg by mouth daily.     Potassium Chloride ER 20 MEQ TBCR Take 1 tablet by mouth 2 (two) times daily.     No current facility-administered medications for this visit.   Facility-Administered Medications Ordered in Other Visits  Medication Dose Route Frequency Provider Last Rate Last Admin   magnesium sulfate 2 GM/50ML IVPB             ALLERGIES:  No Known Allergies  PHYSICAL EXAM:  Performance status (ECOG): 1 - Symptomatic but completely ambulatory  There were no vitals filed for this visit.  Wt Readings from Last 3 Encounters:  04/24/22 108.7 kg (239 lb 9.6 oz)  04/20/22 108.9 kg (240 lb)  03/30/22 109.3 kg (241 lb)   Physical Exam Vitals and nursing note reviewed. Exam conducted with a chaperone present.  Constitutional:      Appearance: Normal appearance.  Cardiovascular:     Rate and Rhythm: Normal rate and regular rhythm.     Pulses: Normal pulses.     Heart sounds: Murmur heard.     Systolic murmur is present.  Pulmonary:     Effort: Pulmonary effort is normal.     Breath sounds: Normal breath sounds.  Abdominal:     Palpations: Abdomen is soft. There is no hepatomegaly, splenomegaly or mass.     Tenderness: There is no abdominal tenderness.  Lymphadenopathy:     Upper Body:     Right upper body: No supraclavicular, axillary or pectoral adenopathy.     Left upper body: No supraclavicular, axillary or pectoral adenopathy.  Skin:    Comments: Right  leg hyperpigmentation  Neurological:     General: No focal deficit present.     Mental Status: She is alert and oriented to person, place, and time.  Psychiatric:        Mood and Affect: Mood normal.        Behavior: Behavior normal.     LABORATORY DATA:  I have reviewed the labs as listed.     Latest Ref Rng & Units 04/24/2022   10:02 AM 03/30/2022    8:02 AM 03/16/2022   10:04 AM  CBC  WBC 4.0 - 10.5 K/uL 4.6  4.5  4.3   Hemoglobin 12.0 - 15.0 g/dL 10.2  10.1  9.5   Hematocrit 36.0 - 46.0 % 33.3  33.5  31.5   Platelets 150 - 400 K/uL 326  315  397       Latest Ref Rng & Units 04/24/2022   10:02 AM 03/30/2022    8:02 AM 03/16/2022   10:04 AM  CMP  Glucose 70 - 99 mg/dL 98  99  99   BUN 8 - 23 mg/dL 11  10  9    Creatinine 0.44 - 1.00 mg/dL 0.98  0.88  0.83   Sodium 135 - 145 mmol/L 137  140  140   Potassium 3.5 - 5.1 mmol/L 4.4  3.9  3.9   Chloride 98 - 111 mmol/L 103  106  108   CO2 22 - 32 mmol/L 25  26  25    Calcium 8.9 - 10.3 mg/dL 8.8  8.6  8.7   Total Protein 6.5 - 8.1 g/dL 6.4  6.8  6.5   Total Bilirubin 0.3 - 1.2 mg/dL <0.1  0.2  0.2   Alkaline Phos 38 - 126 U/L 80  86  82   AST 15 - 41 U/L 25  26  24    ALT 0 - 44 U/L 17  18  18      DIAGNOSTIC IMAGING:  I have independently reviewed the scans and discussed with the patient. MR BRAIN W WO CONTRAST  Result Date: 04/04/2022 CLINICAL DATA:  Meningioma follow-up EXAM: MRI HEAD WITHOUT AND WITH CONTRAST TECHNIQUE: Multiplanar, multiecho pulse sequences of the brain and surrounding structures were obtained without and with intravenous contrast. CONTRAST:  8mL GADAVIST GADOBUTROL 1 MMOL/ML IV SOLN COMPARISON:  09/27/2021 FINDINGS: Brain: Dural-based avidly enhancing mass at the left middle cranial fossa, broadly along the greater sphenoid wing. The mass measures up to 2.6 by 2.6 by 1.7 Cm, essentially unchanged. No visible ingrowth into the adjacent skull base foramina or vascular compromise. Lobulation indistinguishable  from the adjacent left temporal lobe is non progressed. Edematous signal in the left temporal white matter is regressed and mild. No incidental infarct, hemorrhage, hydrocephalus, or collection. Brain volume is normal. Single chronic focus of susceptibility in the left frontal white matter. Vascular: Major flow voids and vascular enhancements are preserved Skull and upper cervical spine: Normal marrow signal Sinuses/Orbits: Negative. IMPRESSION: Size stable sphenoid wing meningioma on the left with decreasing vasogenic edema in the adjacent temporal lobe. Electronically Signed   By: Jorje Guild M.D.   On: 04/04/2022 04:51     ASSESSMENT:  1.  Adenocarcinoma of left lung ([facility]) -Chemoradiation therapy with carboplatin and paclitaxel from 01/07/2019 through 02/11/2019. -Consolidation immunotherapy with durvalumab from 03/19/2019 through 04/15/2018, held due to pneumonitis. -CT chest on 07/02/2019 shows left upper lobe lung mass measuring 2.6 x 2.0 cm.  It shows improvement in size. -MRI of the brain on 07/18/2019 showed left sphenoid wing meningioma measuring 2.6 x 2.0 x 2.6 cm unchanged.  No new enhancing intracranial lesion.  Increased left temporal white matter edema. -Durvalumab restarted on 07/24/2019. -She was evaluated by cardiology with a stress test.  EF was 46%.  She underwent cardiac catheterization which did not show any abnormalities. - 1 year of durvalumab completed on 07/23/2020. - Liver mass biopsy (08/08/2021): Adenocarcinoma, CK7 positive, negative for TTF-1, Napsin a, GATA3, ER, CK20, CDX2 - PET scan (08/25/2021): Right upper lobe lung nodule 9 mm, SUV 5.0.  Groundglass and solid nodule periphery of the right upper lobe 8 mm, SUV 2.45.  Right lobe liver mass 4.5 x 3.6 cm, SUV 7.78. - NGS testing: PD-L1 negative, TMB-low, MSI-stable, K-ras G12 D.  No other targetable mutations. - Carboplatin, pemetrexed and pembrolizumab 4 cycles from 09/22/2021 through 11/25/2021, followed by maintenance  pemetrexed and pembrolizumab -  CT CAP (11/10/2021): Reduced size of liver mass and interval resolution of previously seen groundglass pulmonary nodules.  There is a new inflammatory right upper lobe nodule.  PLAN:  1.  Metastatic adenocarcinoma of the lung to the liver and right lung: - Right leg is still hyperpigmented.  Thought to be secondary to poor circulation.  She is continuing betamethasone dipropionate ointment twice daily. - She reports some dyspnea on exertion with no cough. - Reviewed labs today which showed normal LFTs.  CBC was grossly normal.  TSH is 1.6. - Proceed with treatment today.  RTC 3 weeks for follow-up.  Will plan to repeat CT CAP prior to next visit.   2.  Meningioma: - MRI brain (04/03/2022): Stable  sphenoid wing meningioma on the left with decreasing vasogenic edema.  3.  Unprovoked pulmonary embolism: - Continue Eliquis 5 mg twice daily indefinitely.  No bleeding issues.  4.  Leg swellings: - Continue Lasix 40 mg daily.  Continue potassium 40 mEq daily.  5.  Dry eyes with itching: - Continue refresh eyedrops.   Orders placed this encounter:  Orders Placed This Encounter  Procedures   CT CHEST ABDOMEN PELVIS W CONTRAST     I,Alexis Herring,acting as a scribe for Derek Jack, MD.,have documented all relevant documentation on the behalf of Derek Jack, MD,as directed by  Derek Jack, MD while in the presence of Derek Jack, MD.  I, Derek Jack MD, have reviewed the above documentation for accuracy and completeness, and I agree with the above.   Derek Jack, MD [place] [phone]

## 2022-04-24 NOTE — Patient Instructions (Signed)
[facility]  Discharge Instructions: Thank you for choosing [facility] to provide your oncology and hematology care.  If you have a lab appointment with the [facility], please come in thru the Main Entrance and check in at the main information desk.  Wear comfortable clothing and clothing appropriate for easy access to any Portacath or PICC line.   We strive to give you quality time with your provider. You may need to reschedule your appointment if you arrive late (15 or more minutes).  Arriving late affects you and other patients whose appointments are after yours.  Also, if you miss three or more appointments without notifying the office, you may be dismissed from the clinic at the provider's discretion.      For prescription refill requests, have your pharmacy contact our office and allow 72 hours for refills to be completed.    Today you received the following chemotherapy and/or immunotherapy agents Keytruda and Alimta   To help prevent nausea and vomiting after your treatment, we encourage you to take your nausea medication as directed.  Pembrolizumab Injection What is this medication? PEMBROLIZUMAB (PEM broe LIZ ue mab) treats some types of cancer. It works by helping your immune system slow or stop the spread of cancer cells. It is a monoclonal antibody. This medicine may be used for other purposes; ask your health care provider or pharmacist if you have questions. COMMON BRAND NAME(S): Keytruda What should I tell my care team before I take this medication? They need to know if you have any of these conditions: Allogeneic stem cell transplant (uses someone else's stem cells) Autoimmune diseases, such as Crohn disease, ulcerative colitis, lupus History of chest radiation Nervous system problems, such as Guillain-Barre syndrome, myasthenia gravis Organ transplant An unusual or allergic reaction to pembrolizumab, other medications, foods, dyes,  or preservatives Pregnant or trying to get pregnant Breast-feeding How should I use this medication? This medication is injected into a vein. It is given by your care team in a hospital or clinic setting. A special MedGuide will be given to you before each treatment. Be sure to read this information carefully each time. Talk to your care team about the use of this medication in children. While it may be prescribed for children as young as 6 months for selected conditions, precautions do apply. Overdosage: If you think you have taken too much of this medicine contact a poison control center or emergency room at once. NOTE: This medicine is only for you. Do not share this medicine with others. What if I miss a dose? Keep appointments for follow-up doses. It is important not to miss your dose. Call your care team if you are unable to keep an appointment. What may interact with this medication? Interactions have not been studied. This list may not describe all possible interactions. Give your health care provider a list of all the medicines, herbs, non-prescription drugs, or dietary supplements you use. Also tell them if you smoke, drink alcohol, or use illegal drugs. Some items may interact with your medicine. What should I watch for while using this medication? Your condition will be monitored carefully while you are receiving this medication. You may need blood work while taking this medication. This medication may cause serious skin reactions. They can happen weeks to months after starting the medication. Contact your care team right away if you notice fevers or flu-like symptoms with a rash. The rash may be red or purple and then  turn into blisters or peeling of the skin. You may also notice a red rash with swelling of the face, lips, or lymph nodes in your neck or under your arms. Tell your care team right away if you have any change in your eyesight. Talk to your care team if you may be  pregnant. Serious birth defects can occur if you take this medication during pregnancy and for 4 months after the last dose. You will need a negative pregnancy test before starting this medication. Contraception is recommended while taking this medication and for 4 months after the last dose. Your care team can help you find the option that works for you. Do not breastfeed while taking this medication and for 4 months after the last dose. What side effects may I notice from receiving this medication? Side effects that you should report to your care team as soon as possible: Allergic reactions--skin rash, itching, hives, swelling of the face, lips, tongue, or throat Dry cough, shortness of breath or trouble breathing Eye pain, redness, irritation, or discharge with blurry or decreased vision Heart muscle inflammation--unusual weakness or fatigue, shortness of breath, chest pain, fast or irregular heartbeat, dizziness, swelling of the ankles, feet, or hands Hormone gland problems--headache, sensitivity to light, unusual weakness or fatigue, dizziness, fast or irregular heartbeat, increased sensitivity to cold or heat, excessive sweating, constipation, hair loss, increased thirst or amount of urine, tremors or shaking, irritability Infusion reactions--chest pain, shortness of breath or trouble breathing, feeling faint or lightheaded Kidney injury (glomerulonephritis)--decrease in the amount of urine, red or dark brown urine, foamy or bubbly urine, swelling of the ankles, hands, or feet Liver injury--right upper belly pain, loss of appetite, nausea, light-colored stool, dark yellow or brown urine, yellowing skin or eyes, unusual weakness or fatigue Pain, tingling, or numbness in the hands or feet, muscle weakness, change in vision, confusion or trouble speaking, loss of balance or coordination, trouble walking, seizures Rash, fever, and swollen lymph nodes Redness, blistering, peeling, or loosening of the  skin, including inside the mouth Sudden or severe stomach pain, bloody diarrhea, fever, nausea, vomiting Side effects that usually do not require medical attention (report to your care team if they continue or are bothersome): Bone, joint, or muscle pain Diarrhea Fatigue Loss of appetite Nausea Skin rash This list may not describe all possible side effects. Call your doctor for medical advice about side effects. You may report side effects to FDA at 1-800-FDA-1088. Where should I keep my medication? This medication is given in a hospital or clinic. It will not be stored at home. NOTE: This sheet is a summary. It may not cover all possible information. If you have questions about this medicine, talk to your doctor, pharmacist, or health care provider.  2023 Elsevier/Gold Standard (2012-11-25 00:00:00)  Pemetrexed Injection What is this medication? PEMETREXED (PEM e TREX ed) treats some types of cancer. It works by slowing down the growth of cancer cells. This medicine may be used for other purposes; ask your health care provider or pharmacist if you have questions. COMMON BRAND NAME(S): Alimta, PEMFEXY What should I tell my care team before I take this medication? They need to know if you have any of these conditions: Infection, such as chickenpox, cold sores, or herpes Kidney disease Low blood cell levels (white cells, red cells, and platelets) Lung or breathing disease, such as asthma Radiation therapy An unusual or allergic reaction to pemetrexed, other medications, foods, dyes, or preservatives If you or your partner are  pregnant or trying to get pregnant Breast-feeding How should I use this medication? This medication is injected into a vein. It is given by your care team in a hospital or clinic setting. Talk to your care team about the use of this medication in children. Special care may be needed. Overdosage: If you think you have taken too much of this medicine contact a  poison control center or emergency room at once. NOTE: This medicine is only for you. Do not share this medicine with others. What if I miss a dose? Keep appointments for follow-up doses. It is important not to miss your dose. Call your care team if you are unable to keep an appointment. What may interact with this medication? Do not take this medication with any of the following: Live virus vaccines This medication may also interact with the following: Ibuprofen This list may not describe all possible interactions. Give your health care provider a list of all the medicines, herbs, non-prescription drugs, or dietary supplements you use. Also tell them if you smoke, drink alcohol, or use illegal drugs. Some items may interact with your medicine. What should I watch for while using this medication? Your condition will be monitored carefully while you are receiving this medication. This medication may make you feel generally unwell. This is not uncommon as chemotherapy can affect healthy cells as well as cancer cells. Report any side effects. Continue your course of treatment even though you feel ill unless your care team tells you to stop. This medication can cause serious side effects. To reduce the risk, your care team may give you other medications to take before receiving this one. Be sure to follow the directions from your care team. This medication can cause a rash or redness in areas of the body that have previously had radiation therapy. If you have had radiation therapy, tell your care team if you notice a rash in this area. This medication may increase your risk of getting an infection. Call your care team for advice if you get a fever, chills, sore throat, or other symptoms of a cold or flu. Do not treat yourself. Try to avoid being around people who are sick. Be careful brushing or flossing your teeth or using a toothpick because you may get an infection or bleed more easily. If you have  any dental work done, tell your dentist you are receiving this medication. Avoid taking medications that contain aspirin, acetaminophen, ibuprofen, naproxen, or ketoprofen unless instructed by your care team. These medications may hide a fever. Check with your care team if you have severe diarrhea, nausea, and vomiting, or if you sweat a lot. The loss of too much body fluid may make it dangerous for you to take this medication. Talk to your care team if you or your partner wish to become pregnant or think either of you might be pregnant. This medication can cause serious birth defects if taken during pregnancy and for 6 months after the last dose. A negative pregnancy test is required before starting this medication. A reliable form of contraception is recommended while taking this medication and for 6 months after the last dose. Talk to your care team about reliable forms of contraception. Do not father a child while taking this medication and for 3 months after the last dose. Use a condom while having sex during this time period. Do not breastfeed while taking this medication and for 1 week after the last dose. This medication may cause infertility. Talk  to your care team if you are concerned about your fertility. What side effects may I notice from receiving this medication? Side effects that you should report to your care team as soon as possible: Allergic reactions--skin rash, itching, hives, swelling of the face, lips, tongue, or throat Dry cough, shortness of breath or trouble breathing Infection--fever, chills, cough, sore throat, wounds that don't heal, pain or trouble when passing urine, general feeling of discomfort or being unwell Kidney injury--decrease in the amount of urine, swelling of the ankles, hands, or feet Low red blood cell level--unusual weakness or fatigue, dizziness, headache, trouble breathing Redness, blistering, peeling, or loosening of the skin, including inside the  mouth Unusual bruising or bleeding Side effects that usually do not require medical attention (report to your care team if they continue or are bothersome): Fatigue Loss of appetite Nausea Vomiting This list may not describe all possible side effects. Call your doctor for medical advice about side effects. You may report side effects to FDA at 1-800-FDA-1088. Where should I keep my medication? This medication is given in a hospital or clinic. It will not be stored at home. NOTE: This sheet is a summary. It may not cover all possible information. If you have questions about this medicine, talk to your doctor, pharmacist, or health care provider.  2023 Elsevier/Gold Standard (2021-07-11 00:00:00)    BELOW ARE SYMPTOMS THAT SHOULD BE REPORTED IMMEDIATELY: *FEVER GREATER THAN 100.4 F (38 C) OR HIGHER *CHILLS OR SWEATING *NAUSEA AND VOMITING THAT IS NOT CONTROLLED WITH YOUR NAUSEA MEDICATION *UNUSUAL SHORTNESS OF BREATH *UNUSUAL BRUISING OR BLEEDING *URINARY PROBLEMS (pain or burning when urinating, or frequent urination) *BOWEL PROBLEMS (unusual diarrhea, constipation, pain near the anus) TENDERNESS IN MOUTH AND THROAT WITH OR WITHOUT PRESENCE OF ULCERS (sore throat, sores in mouth, or a toothache) UNUSUAL RASH, SWELLING OR PAIN  UNUSUAL VAGINAL DISCHARGE OR ITCHING   Items with * indicate a potential emergency and should be followed up as soon as possible or go to the Emergency Department if any problems should occur.  Please show the CHEMOTHERAPY ALERT CARD or IMMUNOTHERAPY ALERT CARD at check-in to the Emergency Department and triage nurse.  Should you have questions after your visit or need to cancel or reschedule your appointment, please contact [facility] [phone]  and follow the prompts.  Office hours are 8:00 a.m. to 4:30 p.m. Monday - Friday. Please note that voicemails left after 4:00 p.m. may not be returned until the following business day.  We are  closed weekends and major holidays. You have access to a nurse at all times for urgent questions. Please call the main number to the clinic [phone] and follow the prompts.  For any non-urgent questions, you may also contact your provider using MyChart. We now offer e-Visits for anyone 88 and older to request care online for non-urgent symptoms. For details visit [email or web address].   Also download the MyChart app! Go to the app store, search "MyChart", open the app, select [facility], and log in with your MyChart username and password.

## 2022-04-24 NOTE — Progress Notes (Signed)
Pt presents today for Keytruda and Alimta per provider's order. Vital signs and labs WNL for treatment. Okay to proceed with treatment today per Dr.K.  Beryle Flock and Alimta given today per MD orders. Tolerated infusion without adverse affects. Vital signs stable. No complaints at this time. Discharged from clinic via wheelchair in stable condition. Alert and oriented x 3. F/U with [facility] as scheduled.

## 2022-04-24 NOTE — Patient Instructions (Signed)
[facility]  Discharge Instructions  You were seen and examined today by Dr. Delton Coombes.  Proceed with treatment today.  Dr. Delton Coombes will schedule a scan prior to your next treatment.  Follow-up as scheduled.  Thank you for choosing [facility] to provide your oncology and hematology care.   To afford each patient quality time with our provider, please arrive at least 15 minutes before your scheduled appointment time. You may need to reschedule your appointment if you arrive late (10 or more minutes). Arriving late affects you and other patients whose appointments are after yours.  Also, if you miss three or more appointments without notifying the office, you may be dismissed from the clinic at the provider's discretion.    Again, thank you for choosing [facility].  Our hope is that these requests will decrease the amount of time that you wait before being seen by our physicians.   If you have a lab appointment with the [facility] please come in thru the Main Entrance and check in at the main information desk.           _____________________________________________________________  Should you have questions after your visit to [facility], please contact our office at [phone] and follow the prompts.  Our office hours are 8:00 a.m. to 4:30 p.m. Monday - Thursday and 8:00 a.m. to 2:30 p.m. Friday.  Please note that voicemails left after 4:00 p.m. may not be returned until the following business day.  We are closed weekends and all major holidays.  You do have access to a nurse 24-7, just call the main number to the clinic [phone] and do not press any options, hold on the line and a nurse will answer the phone.    For prescription refill requests, have your pharmacy contact our office and allow 72 hours.    Masks are optional in the cancer centers. If you would like for your care team to wear a  mask while they are taking care of you, please let them know. You may have one support person who is at least 71 years old accompany you for your appointments.

## 2022-04-25 ENCOUNTER — Other Ambulatory Visit (HOSPITAL_BASED_OUTPATIENT_CLINIC_OR_DEPARTMENT_OTHER): Payer: Self-pay

## 2022-05-09 ENCOUNTER — Ambulatory Visit (HOSPITAL_COMMUNITY)
Admission: RE | Admit: 2022-05-09 | Discharge: 2022-05-09 | Disposition: A | Discharge status: Home / Self Care | Payer: Medicare HMO | Source: Ambulatory Visit | Attending: Hematology | Admitting: Hematology

## 2022-05-09 DIAGNOSIS — J479 Bronchiectasis, uncomplicated: Secondary | ICD-10-CM | POA: Diagnosis not present

## 2022-05-09 DIAGNOSIS — K7689 Other specified diseases of liver: Secondary | ICD-10-CM | POA: Diagnosis not present

## 2022-05-09 DIAGNOSIS — C3492 Malignant neoplasm of unspecified part of left bronchus or lung: Secondary | ICD-10-CM | POA: Diagnosis not present

## 2022-05-09 DIAGNOSIS — J929 Pleural plaque without asbestos: Secondary | ICD-10-CM | POA: Diagnosis not present

## 2022-05-09 DIAGNOSIS — D259 Leiomyoma of uterus, unspecified: Secondary | ICD-10-CM | POA: Diagnosis not present

## 2022-05-09 DIAGNOSIS — I358 Other nonrheumatic aortic valve disorders: Secondary | ICD-10-CM | POA: Diagnosis not present

## 2022-05-09 DIAGNOSIS — K5730 Diverticulosis of large intestine without perforation or abscess without bleeding: Secondary | ICD-10-CM | POA: Diagnosis not present

## 2022-05-09 DIAGNOSIS — J90 Pleural effusion, not elsewhere classified: Secondary | ICD-10-CM | POA: Diagnosis not present

## 2022-05-09 DIAGNOSIS — C3490 Malignant neoplasm of unspecified part of unspecified bronchus or lung: Secondary | ICD-10-CM | POA: Diagnosis not present

## 2022-05-09 MED ORDER — IOHEXOL 300 MG/ML  SOLN
100.0000 mL | Freq: Once | INTRAMUSCULAR | Status: AC | PRN
  Administered 2022-05-09: 100 mL via INTRAVENOUS

## 2022-05-11 ENCOUNTER — Other Ambulatory Visit: Payer: Self-pay

## 2022-05-11 DIAGNOSIS — C787 Secondary malignant neoplasm of liver and intrahepatic bile duct: Secondary | ICD-10-CM

## 2022-05-15 ENCOUNTER — Inpatient Hospital Stay: Payer: Medicare HMO

## 2022-05-15 ENCOUNTER — Inpatient Hospital Stay: Payer: Medicare HMO | Admitting: Hematology

## 2022-05-15 VITALS — BP 99/57 | HR 88 | Temp 97.3°F | Resp 20 | Ht 62.0 in | Wt 240.0 lb

## 2022-05-15 DIAGNOSIS — Z5112 Encounter for antineoplastic immunotherapy: Secondary | ICD-10-CM | POA: Diagnosis not present

## 2022-05-15 DIAGNOSIS — C3490 Malignant neoplasm of unspecified part of unspecified bronchus or lung: Secondary | ICD-10-CM

## 2022-05-15 DIAGNOSIS — Z5111 Encounter for antineoplastic chemotherapy: Secondary | ICD-10-CM | POA: Diagnosis not present

## 2022-05-15 DIAGNOSIS — E8809 Other disorders of plasma-protein metabolism, not elsewhere classified: Secondary | ICD-10-CM

## 2022-05-15 DIAGNOSIS — C3492 Malignant neoplasm of unspecified part of left bronchus or lung: Secondary | ICD-10-CM

## 2022-05-15 DIAGNOSIS — D320 Benign neoplasm of cerebral meninges: Secondary | ICD-10-CM | POA: Diagnosis not present

## 2022-05-15 DIAGNOSIS — Z79899 Other long term (current) drug therapy: Secondary | ICD-10-CM | POA: Diagnosis not present

## 2022-05-15 DIAGNOSIS — H04129 Dry eye syndrome of unspecified lacrimal gland: Secondary | ICD-10-CM | POA: Diagnosis not present

## 2022-05-15 DIAGNOSIS — Z95828 Presence of other vascular implants and grafts: Secondary | ICD-10-CM

## 2022-05-15 DIAGNOSIS — Z79620 Long term (current) use of immunosuppressive biologic: Secondary | ICD-10-CM | POA: Diagnosis not present

## 2022-05-15 DIAGNOSIS — C787 Secondary malignant neoplasm of liver and intrahepatic bile duct: Secondary | ICD-10-CM | POA: Diagnosis not present

## 2022-05-15 LAB — COMPREHENSIVE METABOLIC PANEL
ALT: 13 U/L (ref 0–44)
AST: 20 U/L (ref 15–41)
Albumin: 2.6 g/dL — ABNORMAL LOW (ref 3.5–5.0)
Alkaline Phosphatase: 88 U/L (ref 38–126)
Anion gap: 11 (ref 5–15)
BUN: 7 mg/dL — ABNORMAL LOW (ref 8–23)
CO2: 24 mmol/L (ref 22–32)
Calcium: 8.3 mg/dL — ABNORMAL LOW (ref 8.9–10.3)
Chloride: 105 mmol/L (ref 98–111)
Creatinine, Ser: 0.88 mg/dL (ref 0.44–1.00)
GFR, Estimated: >60 mL/min (ref >60)
Glucose, Bld: 103 mg/dL — ABNORMAL HIGH (ref 70–99)
Potassium: 3.8 mmol/L (ref 3.5–5.1)
Sodium: 140 mmol/L (ref 135–145)
Total Bilirubin: 0.4 mg/dL (ref 0.3–1.2)
Total Protein: 6.4 g/dL — ABNORMAL LOW (ref 6.5–8.1)

## 2022-05-15 LAB — CBC WITH DIFFERENTIAL/PLATELET
Abs Immature Granulocytes: 0.01 10*3/uL (ref 0.00–0.07)
Basophils Absolute: 0 10*3/uL (ref 0.0–0.1)
Basophils Relative: 1 %
Eosinophils Absolute: 0.1 10*3/uL (ref 0.0–0.5)
Eosinophils Relative: 2 %
HCT: 31.6 % — ABNORMAL LOW (ref 36.0–46.0)
Hemoglobin: 9.6 g/dL — ABNORMAL LOW (ref 12.0–15.0)
Immature Granulocytes: 0 %
Lymphocytes Relative: 13 %
Lymphs Abs: 0.6 10*3/uL — ABNORMAL LOW (ref 0.7–4.0)
MCH: 28.7 pg (ref 26.0–34.0)
MCHC: 30.4 g/dL (ref 30.0–36.0)
MCV: 94.3 fL (ref 80.0–100.0)
Monocytes Absolute: 0.5 10*3/uL (ref 0.1–1.0)
Monocytes Relative: 11 %
Neutro Abs: 3.5 10*3/uL (ref 1.7–7.7)
Neutrophils Relative %: 73 %
Platelets: 456 10*3/uL — ABNORMAL HIGH (ref 150–400)
RBC: 3.35 MIL/uL — ABNORMAL LOW (ref 3.87–5.11)
RDW: 15.9 % — ABNORMAL HIGH (ref 11.5–15.5)
WBC: 4.8 10*3/uL (ref 4.0–10.5)
nRBC: 0 % (ref 0.0–0.2)

## 2022-05-15 LAB — URINALYSIS, DIPSTICK ONLY
Bilirubin Urine: NEGATIVE
Glucose, UA: NEGATIVE mg/dL
Ketones, ur: NEGATIVE mg/dL
Nitrite: NEGATIVE
Protein, ur: NEGATIVE mg/dL
Specific Gravity, Urine: 1.021 (ref 1.005–1.030)
pH: 6 (ref 5.0–8.0)

## 2022-05-15 LAB — MAGNESIUM: Magnesium: 1.9 mg/dL (ref 1.7–2.4)

## 2022-05-15 LAB — TSH: TSH: 3.154 u[IU]/mL (ref 0.350–4.500)

## 2022-05-15 MED ORDER — ONDANSETRON HCL 4 MG/2ML IJ SOLN
8.0000 mg | Freq: Once | INTRAMUSCULAR | Status: AC
  Administered 2022-05-15: 8 mg via INTRAVENOUS
  Filled 2022-05-15: qty 4

## 2022-05-15 MED ORDER — SODIUM CHLORIDE 0.9 % IV SOLN: Freq: Once | INTRAVENOUS | Status: AC

## 2022-05-15 MED ORDER — SODIUM CHLORIDE 0.9 % IV SOLN
500.0000 mg/m2 | Freq: Once | INTRAVENOUS | Status: AC
  Administered 2022-05-15: 1100 mg via INTRAVENOUS
  Filled 2022-05-15: qty 40

## 2022-05-15 MED ORDER — SODIUM CHLORIDE 0.9% FLUSH
10.0000 mL | Freq: Once | INTRAVENOUS | Status: AC
  Administered 2022-05-15: 10 mL via INTRAVENOUS

## 2022-05-15 MED ORDER — SODIUM CHLORIDE 0.9% FLUSH
10.0000 mL | INTRAVENOUS | Status: DC | PRN
  Administered 2022-05-15: 10 mL

## 2022-05-15 MED ORDER — SODIUM CHLORIDE 0.9 % IV SOLN
200.0000 mg | Freq: Once | INTRAVENOUS | Status: AC
  Administered 2022-05-15: 200 mg via INTRAVENOUS
  Filled 2022-05-15: qty 8

## 2022-05-15 MED ORDER — HEPARIN SOD (PORK) LOCK FLUSH 100 UNIT/ML IV SOLN
500.0000 [IU] | Freq: Once | INTRAVENOUS | Status: AC | PRN
  Administered 2022-05-15: 500 [IU]

## 2022-05-15 NOTE — Patient Instructions (Signed)
[place]  Discharge Instructions: Thank you for choosing [facility] to provide your oncology and hematology care.  If you have a lab appointment with the [facility], please come in thru the Main Entrance and check in at the main information desk.  Wear comfortable clothing and clothing appropriate for easy access to any Portacath or PICC line.   We strive to give you quality time with your provider. You may need to reschedule your appointment if you arrive late (15 or more minutes).  Arriving late affects you and other patients whose appointments are after yours.  Also, if you miss three or more appointments without notifying the office, you may be dismissed from the clinic at the provider's discretion.      For prescription refill requests, have your pharmacy contact our office and allow 72 hours for refills to be completed.    Today you received the following chemotherapy and/or immunotherapy agents Keytruda/Alimta.  Pembrolizumab Injection What is this medication? PEMBROLIZUMAB (PEM broe LIZ ue mab) treats some types of cancer. It works by helping your immune system slow or stop the spread of cancer cells. It is a monoclonal antibody. This medicine may be used for other purposes; ask your health care provider or pharmacist if you have questions. COMMON BRAND NAME(S): Keytruda What should I tell my care team before I take this medication? They need to know if you have any of these conditions: Allogeneic stem cell transplant (uses someone else's stem cells) Autoimmune diseases, such as Crohn disease, ulcerative colitis, lupus History of chest radiation Nervous system problems, such as Guillain-Barre syndrome, myasthenia gravis Organ transplant An unusual or allergic reaction to pembrolizumab, other medications, foods, dyes, or preservatives Pregnant or trying to get pregnant Breast-feeding How should I use this medication? This medication is  injected into a vein. It is given by your care team in a hospital or clinic setting. A special MedGuide will be given to you before each treatment. Be sure to read this information carefully each time. Talk to your care team about the use of this medication in children. While it may be prescribed for children as young as 6 months for selected conditions, precautions do apply. Overdosage: If you think you have taken too much of this medicine contact a poison control center or emergency room at once. NOTE: This medicine is only for you. Do not share this medicine with others. What if I miss a dose? Keep appointments for follow-up doses. It is important not to miss your dose. Call your care team if you are unable to keep an appointment. What may interact with this medication? Interactions have not been studied. This list may not describe all possible interactions. Give your health care provider a list of all the medicines, herbs, non-prescription drugs, or dietary supplements you use. Also tell them if you smoke, drink alcohol, or use illegal drugs. Some items may interact with your medicine. What should I watch for while using this medication? Your condition will be monitored carefully while you are receiving this medication. You may need blood work while taking this medication. This medication may cause serious skin reactions. They can happen weeks to months after starting the medication. Contact your care team right away if you notice fevers or flu-like symptoms with a rash. The rash may be red or purple and then turn into blisters or peeling of the skin. You may also notice a red rash with swelling of the face, lips, or lymph  nodes in your neck or under your arms. Tell your care team right away if you have any change in your eyesight. Talk to your care team if you may be pregnant. Serious birth defects can occur if you take this medication during pregnancy and for 4 months after the last dose. You  will need a negative pregnancy test before starting this medication. Contraception is recommended while taking this medication and for 4 months after the last dose. Your care team can help you find the option that works for you. Do not breastfeed while taking this medication and for 4 months after the last dose. What side effects may I notice from receiving this medication? Side effects that you should report to your care team as soon as possible: Allergic reactions--skin rash, itching, hives, swelling of the face, lips, tongue, or throat Dry cough, shortness of breath or trouble breathing Eye pain, redness, irritation, or discharge with blurry or decreased vision Heart muscle inflammation--unusual weakness or fatigue, shortness of breath, chest pain, fast or irregular heartbeat, dizziness, swelling of the ankles, feet, or hands Hormone gland problems--headache, sensitivity to light, unusual weakness or fatigue, dizziness, fast or irregular heartbeat, increased sensitivity to cold or heat, excessive sweating, constipation, hair loss, increased thirst or amount of urine, tremors or shaking, irritability Infusion reactions--chest pain, shortness of breath or trouble breathing, feeling faint or lightheaded Kidney injury (glomerulonephritis)--decrease in the amount of urine, red or dark Stacye Noori urine, foamy or bubbly urine, swelling of the ankles, hands, or feet Liver injury--right upper belly pain, loss of appetite, nausea, light-colored stool, dark yellow or Denise [name] urine, yellowing skin or eyes, unusual weakness or fatigue Pain, tingling, or numbness in the hands or feet, muscle weakness, change in vision, confusion or trouble speaking, loss of balance or coordination, trouble walking, seizures Rash, fever, and swollen lymph nodes Redness, blistering, peeling, or loosening of the skin, including inside the mouth Sudden or severe stomach pain, bloody diarrhea, fever, nausea, vomiting Side effects that  usually do not require medical attention (report to your care team if they continue or are bothersome): Bone, joint, or muscle pain Diarrhea Fatigue Loss of appetite Nausea Skin rash This list may not describe all possible side effects. Call your doctor for medical advice about side effects. You may report side effects to FDA at 1-800-FDA-1088. Where should I keep my medication? This medication is given in a hospital or clinic. It will not be stored at home. NOTE: This sheet is a summary. It may not cover all possible information. If you have questions about this medicine, talk to your doctor, pharmacist, or health care provider.  2023 Elsevier/Gold Standard (2021-07-19 00:00:00)    Pemetrexed Injection What is this medication? PEMETREXED (PEM e TREX ed) treats some types of cancer. It works by slowing down the growth of cancer cells. This medicine may be used for other purposes; ask your health care provider or pharmacist if you have questions. COMMON BRAND NAME(S): Alimta, PEMFEXY What should I tell my care team before I take this medication? They need to know if you have any of these conditions: Infection, such as chickenpox, cold sores, or herpes Kidney disease Low blood cell levels (white cells, red cells, and platelets) Lung or breathing disease, such as asthma Radiation therapy An unusual or allergic reaction to pemetrexed, other medications, foods, dyes, or preservatives If you or your partner are pregnant or trying to get pregnant Breast-feeding How should I use this medication? This medication is injected into a vein. It  is given by your care team in a hospital or clinic setting. Talk to your care team about the use of this medication in children. Special care may be needed. Overdosage: If you think you have taken too much of this medicine contact a poison control center or emergency room at once. NOTE: This medicine is only for you. Do not share this medicine with  others. What if I miss a dose? Keep appointments for follow-up doses. It is important not to miss your dose. Call your care team if you are unable to keep an appointment. What may interact with this medication? Do not take this medication with any of the following: Live virus vaccines This medication may also interact with the following: Ibuprofen This list may not describe all possible interactions. Give your health care provider a list of all the medicines, herbs, non-prescription drugs, or dietary supplements you use. Also tell them if you smoke, drink alcohol, or use illegal drugs. Some items may interact with your medicine. What should I watch for while using this medication? Your condition will be monitored carefully while you are receiving this medication. This medication may make you feel generally unwell. This is not uncommon as chemotherapy can affect healthy cells as well as cancer cells. Report any side effects. Continue your course of treatment even though you feel ill unless your care team tells you to stop. This medication can cause serious side effects. To reduce the risk, your care team may give you other medications to take before receiving this one. Be sure to follow the directions from your care team. This medication can cause a rash or redness in areas of the body that have previously had radiation therapy. If you have had radiation therapy, tell your care team if you notice a rash in this area. This medication may increase your risk of getting an infection. Call your care team for advice if you get a fever, chills, sore throat, or other symptoms of a cold or flu. Do not treat yourself. Try to avoid being around people who are sick. Be careful brushing or flossing your teeth or using a toothpick because you may get an infection or bleed more easily. If you have any dental work done, tell your dentist you are receiving this medication. Avoid taking medications that contain  aspirin, acetaminophen, ibuprofen, naproxen, or ketoprofen unless instructed by your care team. These medications may hide a fever. Check with your care team if you have severe diarrhea, nausea, and vomiting, or if you sweat a lot. The loss of too much body fluid may make it dangerous for you to take this medication. Talk to your care team if you or your partner wish to become pregnant or think either of you might be pregnant. This medication can cause serious birth defects if taken during pregnancy and for 6 months after the last dose. A negative pregnancy test is required before starting this medication. A reliable form of contraception is recommended while taking this medication and for 6 months after the last dose. Talk to your care team about reliable forms of contraception. Do not father a child while taking this medication and for 3 months after the last dose. Use a condom while having sex during this time period. Do not breastfeed while taking this medication and for 1 week after the last dose. This medication may cause infertility. Talk to your care team if you are concerned about your fertility. What side effects may I notice from receiving this medication?  Side effects that you should report to your care team as soon as possible: Allergic reactions--skin rash, itching, hives, swelling of the face, lips, tongue, or throat Dry cough, shortness of breath or trouble breathing Infection--fever, chills, cough, sore throat, wounds that don't heal, pain or trouble when passing urine, general feeling of discomfort or being unwell Kidney injury--decrease in the amount of urine, swelling of the ankles, hands, or feet Low red blood cell level--unusual weakness or fatigue, dizziness, headache, trouble breathing Redness, blistering, peeling, or loosening of the skin, including inside the mouth Unusual bruising or bleeding Side effects that usually do not require medical attention (report to your care team  if they continue or are bothersome): Fatigue Loss of appetite Nausea Vomiting This list may not describe all possible side effects. Call your doctor for medical advice about side effects. You may report side effects to FDA at 1-800-FDA-1088. Where should I keep my medication? This medication is given in a hospital or clinic. It will not be stored at home. NOTE: This sheet is a summary. It may not cover all possible information. If you have questions about this medicine, talk to your doctor, pharmacist, or health care provider.  2023 Elsevier/Gold Standard (2021-07-11 00:00:00)        To help prevent nausea and vomiting after your treatment, we encourage you to take your nausea medication as directed.  BELOW ARE SYMPTOMS THAT SHOULD BE REPORTED IMMEDIATELY: *FEVER GREATER THAN 100.4 F (38 C) OR HIGHER *CHILLS OR SWEATING *NAUSEA AND VOMITING THAT IS NOT CONTROLLED WITH YOUR NAUSEA MEDICATION *UNUSUAL SHORTNESS OF BREATH *UNUSUAL BRUISING OR BLEEDING *URINARY PROBLEMS (pain or burning when urinating, or frequent urination) *BOWEL PROBLEMS (unusual diarrhea, constipation, pain near the anus) TENDERNESS IN MOUTH AND THROAT WITH OR WITHOUT PRESENCE OF ULCERS (sore throat, sores in mouth, or a toothache) UNUSUAL RASH, SWELLING OR PAIN  UNUSUAL VAGINAL DISCHARGE OR ITCHING   Items with * indicate a potential emergency and should be followed up as soon as possible or go to the Emergency Department if any problems should occur.  Please show the CHEMOTHERAPY ALERT CARD or IMMUNOTHERAPY ALERT CARD at check-in to the Emergency Department and triage nurse.  Should you have questions after your visit or need to cancel or reschedule your appointment, please contact [facility] [phone]  and follow the prompts.  Office hours are 8:00 a.m. to 4:30 p.m. Monday - Friday. Please note that voicemails left after 4:00 p.m. may not be returned until the following business day.  We  are closed weekends and major holidays. You have access to a nurse at all times for urgent questions. Please call the main number to the clinic [phone] and follow the prompts.  For any non-urgent questions, you may also contact your provider using MyChart. We now offer e-Visits for anyone 35 and older to request care online for non-urgent symptoms. For details visit [email or web address].   Also download the MyChart app! Go to the app store, search "MyChart", open the app, select [facility], and log in with your MyChart username and password.

## 2022-05-15 NOTE — Progress Notes (Signed)
Patient has been examined by Dr. Delton Coombes. Vital signs and labs have been reviewed by MD - ANC, Creatinine, LFTs, hemoglobin, and platelets are within treatment parameters per M.D. - pt may proceed with treatment.  Primary RN and pharmacy notified.

## 2022-05-15 NOTE — Progress Notes (Signed)
[facility] [address] [place] [address]    Clinic Day:  05/15/2022  Referring physician: Iona Beard, MD  Patient Care Team: Iona Beard, MD as PCP - General (Family Medicine) Satira Sark, MD as PCP - Cardiology (Cardiology) Derek Jack, MD as Medical Oncologist (Medical Oncology)   ASSESSMENT & PLAN:   Assessment: 1.  Adenocarcinoma of left lung (HCC) -Chemoradiation therapy with carboplatin and paclitaxel from 01/07/2019 through 02/11/2019. -Consolidation immunotherapy with durvalumab from 03/19/2019 through 04/15/2018, held due to pneumonitis. -CT chest on 07/02/2019 shows left upper lobe lung mass measuring 2.6 x 2.0 cm.  It shows improvement in size. -MRI of the brain on 07/18/2019 showed left sphenoid wing meningioma measuring 2.6 x 2.0 x 2.6 cm unchanged.  No new enhancing intracranial lesion.  Increased left temporal white matter edema. -Durvalumab restarted on 07/24/2019. -She was evaluated by cardiology with a stress test.  EF was 46%.  She underwent cardiac catheterization which did not show any abnormalities. - 1 year of durvalumab completed on 07/23/2020. - Liver mass biopsy (08/08/2021): Adenocarcinoma, CK7 positive, negative for TTF-1, Napsin a, GATA3, ER, CK20, CDX2 - PET scan (08/25/2021): Right upper lobe lung nodule 9 mm, SUV 5.0.  Groundglass and solid nodule periphery of the right upper lobe 8 mm, SUV 2.45.  Right lobe liver mass 4.5 x 3.6 cm, SUV 7.78. - NGS testing: PD-L1 negative, TMB-low, MSI-stable, K-ras G12 D.  No other targetable mutations. - Carboplatin, pemetrexed and pembrolizumab 4 cycles from 09/22/2021 through 11/25/2021, followed by maintenance pemetrexed and pembrolizumab -  CT CAP (11/10/2021): Reduced size of liver mass and interval resolution of previously seen groundglass pulmonary nodules.  There is a new inflammatory right upper lobe nodule.  Plan: 1.  Metastatic adenocarcinoma of the lung to the liver and right  lung: - CT CAP (05/09/2022): Stable liver lesions with no new lesions.  Stable posttreatment changes along the left lung, perihilar and pleural thickening and opacity.  Stable right-sided lung nodules. - Reviewed labs today which showed low albumin of 2.6.  CBC was grossly normal.  TSH is 3.1. - UA showed protein is negative. - Proceed with treatment today without any dose modifications.  RTC 3 weeks for follow-up.   2.  Meningioma: - MRI brain on 04/03/2022: Stable sphenoid wing meningioma on the left with decreasing vasogenic edema.  3.  Unprovoked pulmonary embolism: - Continue Eliquis 5 mg twice daily indefinitely.  No bleeding issues reported.   4.  Leg swellings: - Continue Lasix 40 mg daily as needed.  Continue potassium 40 mEq daily.   5.  Dry eyes with itching: - Continue refresh eyedrops which are helping.  Orders Placed This Encounter  Procedures   Magnesium    Standing Status:   Future    Standing Expiration Date:   08/28/2023   CBC with Differential    Standing Status:   Future    Standing Expiration Date:   08/29/2023   Comprehensive metabolic panel    Standing Status:   Future    Standing Expiration Date:   08/29/2023   Urinalysis, dipstick only      Beverly Gust Oliver,acting as a scribe for Derek Jack, MD.,have documented all relevant documentation on the behalf of Derek Jack, MD,as directed by  Derek Jack, MD while in the presence of Derek Jack, MD.   I, Derek Jack MD, have reviewed the above documentation for accuracy and completeness, and I agree with the above.   Bill Salinas  Danne Baxter   2/26/202410:35 AM  CHIEF COMPLAINT:   Diagnosis: stage IV adenocarcinoma of the left lung, negative for targetable mutations     Cancer Staging  Adenocarcinoma of left lung [facility]) Staging form: Lung, AJCC 8th Edition - Clinical stage from 01/02/2019: Stage IIIB (cT3, cN2, cM0) - Signed by Derek Jack, MD on 01/02/2019 -  Pathologic stage from 09/07/2021: Stage IVA (pTX, pNX, pM1b) - Unsigned    Prior Therapy: 1. Chemoradiation with carboplatin and paclitaxel from 01/07/2019 to 02/11/2019. 2. Consolidation with durvalumab from 03/19/2019 to 04/16/2019, held due to pneumonitis.    Current Therapy:  Pemetrexed and pembrolizumab maintenance    HISTORY OF PRESENT ILLNESS:   Oncology History  Adenocarcinoma of left lung ([place])  12/09/2018 Initial Diagnosis   Adenocarcinoma of left lung ([place])   01/02/2019 Cancer Staging   Staging form: Lung, AJCC 8th Edition - Clinical stage from 01/02/2019: Stage IIIB (cT3, cN2, cM0) - Signed by Derek Jack, MD on 01/02/2019   01/07/2019 - 02/11/2019 Chemotherapy   The patient had palonosetron (ALOXI) injection 0.25 mg, 0.25 mg, Intravenous,  Once, 6 of 6 cycles Administration: 0.25 mg (01/07/2019), 0.25 mg (01/14/2019), 0.25 mg (01/21/2019), 0.25 mg (01/28/2019), 0.25 mg (02/04/2019), 0.25 mg (02/11/2019) CARBOplatin (PARAPLATIN) 270 mg in sodium chloride 0.9 % 250 mL chemo infusion, 270 mg (100 % of original dose 266.4 mg), Intravenous,  Once, 6 of 6 cycles Dose modification:   (original dose 266.4 mg, Cycle 1),   (original dose 266.4 mg, Cycle 2),   (original dose 266.4 mg, Cycle 3),   (original dose 266.4 mg, Cycle 4) Administration: 270 mg (01/07/2019), 270 mg (01/14/2019), 270 mg (01/21/2019), 270 mg (01/28/2019), 270 mg (02/04/2019), 270 mg (02/11/2019) PACLitaxel (TAXOL) 108 mg in sodium chloride 0.9 % 250 mL chemo infusion (</= '80mg'$ /m2), 45 mg/m2 = 108 mg, Intravenous,  Once, 6 of 6 cycles Administration: 108 mg (01/07/2019), 108 mg (01/14/2019), 108 mg (01/21/2019), 108 mg (01/28/2019), 108 mg (02/04/2019), 108 mg (02/11/2019) fosaprepitant (EMEND) 150 mg, dexamethasone (DECADRON) 12 mg in sodium chloride 0.9 % 145 mL IVPB, , Intravenous,  Once, 5 of 5 cycles Administration:  (01/14/2019),  (01/21/2019),  (01/28/2019),  (02/04/2019),  (02/11/2019)  for  chemotherapy treatment.    03/19/2019 - 07/23/2020 Chemotherapy   Patient is on Treatment Plan : LUNG DURVALUMAB [id number]     09/22/2021 - 11/04/2021 Chemotherapy   Patient is on Treatment Plan : LUNG Carboplatin (5) + Pemetrexed (500) + Pembrolizumab (200) D1 q21d Induction x 4 cycles / Maintenance Pemetrexed (500) + Pembrolizumab (200) D1 q21d     09/22/2021 -  Chemotherapy   Patient is on Treatment Plan : LUNG Carboplatin (5) + Pemetrexed (500) + Pembrolizumab (200) D1 q21d Induction x 4 cycles / Maintenance Pemetrexed (500) + Pembrolizumab (200) D1 q21d        INTERVAL HISTORY:   Nastassja is a 72 y.o. female presenting to clinic today for follow up of stage IV adenocarcinoma of the left lung, negative for targetable mutations. She was last seen by me on 04/24/2022.  Today, she states that she is doing well overall. Her appetite level is at 100%. Her energy level is at 50%. She states her rash has healed some and no longer itches. She takes Potassium supplement daily. She often in the morning notices blood when blowing her nose. However, this is only when she uses her oxygen machine.    PAST MEDICAL HISTORY:   Past Medical History: Past Medical History:  Diagnosis Date   Anemia  Aortic stenosis    Arthritis    Coronary artery calcification seen on CT scan    Essential hypertension    GERD (gastroesophageal reflux disease)    History of lung cancer    Stage III adenocarcinoma status post chemoradiation   Port-A-Cath in place 01/06/2019   Type 2 diabetes mellitus [facility])     Surgical History: Past Surgical History:  Procedure Laterality Date   CHOLECYSTECTOMY  1997   COLONOSCOPY     GASTRIC BYPASS     INCISIONAL HERNIA REPAIR  04/11/11   IR IMAGING GUIDED PORT INSERTION  12/27/2018   Right   IR US GUIDE BX ASP/DRAIN  08/08/2021   LAPAROSCOPIC SALPINGOOPHERECTOMY     LAPAROTOMY  04/11/2011   Procedure: EXPLORATORY LAPAROTOMY;  Surgeon: Joyice Faster. Cornett, MD;  Location: [facility];   Service: General;  Laterality: N/A;  closure port hole   RIGHT/LEFT HEART CATH AND CORONARY ANGIOGRAPHY N/A 12/29/2019   Procedure: RIGHT/LEFT HEART CATH AND CORONARY ANGIOGRAPHY;  Surgeon: Troy Sine, MD;  Location: [facility];  Service: Cardiovascular;  Laterality: N/A;   TOTAL HIP ARTHROPLASTY  03/07/2012   Procedure: TOTAL HIP ARTHROPLASTY ANTERIOR APPROACH;  Surgeon: Mauri Pole, MD;  Location: [facility];  Service: Orthopedics;  Laterality: Right;   TOTAL SHOULDER ARTHROPLASTY Left 01/21/2015   TOTAL SHOULDER ARTHROPLASTY Left 01/21/2015   Procedure: LEFT TOTAL SHOULDER ARTHROPLASTY;  Surgeon: Justice Britain, MD;  Location: [facility];  Service: Orthopedics;  Laterality: Left;   VAGINAL HYSTERECTOMY      Social History: Social History   Socioeconomic History   Marital status: Single    Spouse name: Not on file   Number of children: Not on file   Years of education: 12th grade   Highest education level: Not on file  Occupational History   Occupation: Employed    Employer: [employer]  Tobacco Use   Smoking status: Former    Packs/day: 1.00    Years: 12.00    Total pack years: 12.00    Types: Cigarettes    Quit date: 03/20/1976    Years since quitting: 46.1   Smokeless tobacco: Never  Vaping Use   Vaping Use: Never used  Substance and Sexual Activity   Alcohol use: No   Drug use: No   Sexual activity: Not Currently  Other Topics Concern   Not on file  Social History Narrative   Not on file   Social Determinants of Health   Financial Resource Strain: Low Risk  (12/06/2018)   Overall Financial Resource Strain (CARDIA)    Difficulty of Paying Living Expenses: Not very hard  Food Insecurity: No Food Insecurity (12/06/2018)   Hunger Vital Sign    Worried About Running Out of Food in the Last Year: Never true    Ran Out of Food in the Last Year: Never true  Transportation Needs: No Transportation Needs (12/06/2018)   PRAPARE - Radiographer, therapeutic (Medical): No    Lack of Transportation (Non-Medical): No  Physical Activity: Inactive (12/06/2018)   Exercise Vital Sign    Days of Exercise per Week: 0 days    Minutes of Exercise per Session: 0 min  Stress: No Stress Concern Present (12/06/2018)   [place]    Feeling of Stress : Only a little  Social Connections: Moderately Isolated (12/06/2018)   Social Connection and Isolation Panel [NHANES]    Frequency of Communication with Friends  and Family: More than three times a week    Frequency of Social Gatherings with Friends and Family: Once a week    Attends Religious Services: More than 4 times per year    Active Member of [employer] or Organizations: No    Attends Archivist Meetings: Never    Marital Status: Never married  Intimate Partner Violence: Not At Risk (12/06/2018)   Humiliation, Afraid, Rape, and Kick questionnaire    Fear of Current or Ex-Partner: No    Emotionally Abused: No    Physically Abused: No    Sexually Abused: No    Family History: Family History  Problem Relation Age of Onset   Breast cancer Mother    COPD Mother    Arthritis Mother    Diabetes Mother    Hypertension Mother    Hypertension Father    Diabetes Father    Breast cancer Sister    Thyroid cancer Brother    [place]'s disease Maternal Grandmother    Heart attack Brother     Current Medications:  Current Outpatient Medications:    augmented betamethasone dipropionate (DIPROLENE-AF) 0.05 % cream, Apply 1 Application topically 2 (two) times daily., Disp: , Rfl:    Calcium Carbonate Antacid (TUMS PO), Take 4 tablets by mouth daily., Disp: , Rfl:    carvedilol (COREG) 3.125 MG tablet, TAKE 1 TABLET BY MOUTH 2 TIMES DAILY., Disp: 180 tablet, Rfl: 1   Cyanocobalamin (VITAMIN B-12) 2500 MCG SUBL, Place 2,500 mcg under the tongue every morning. , Disp: , Rfl:    diphenhydrAMINE (BENADRYL) 25 MG tablet, Take  25 mg by mouth every 6 (six) hours as needed for itching or allergies., Disp: , Rfl:    ferrous sulfate 325 (65 FE) MG tablet, Take 325 mg by mouth every evening., Disp: , Rfl:    fluticasone (FLONASE) 50 MCG/ACT nasal spray, Place 1 spray into both nostrils 2 (two) times daily., Disp: 16 g, Rfl: 2   folic acid (FOLVITE) 1 MG tablet, TAKE 1 TABLET BY MOUTH EVERY DAY, Disp: 90 tablet, Rfl: 1   furosemide (LASIX) 20 MG tablet, Take 20 mg by mouth 2 (two) times daily., Disp: , Rfl:    gabapentin (NEURONTIN) 300 MG capsule, Take 300 mg by mouth 2 (two) times daily., Disp: , Rfl:    Multiple Vitamin (MULITIVITAMIN WITH MINERALS) TABS, Take 1 tablet by mouth daily., Disp: , Rfl:    omeprazole (PRILOSEC) 20 MG capsule, Take 20 mg by mouth daily., Disp: , Rfl:    Potassium Chloride ER 20 MEQ TBCR, Take 1 tablet by mouth 2 (two) times daily., Disp: , Rfl:    apixaban (ELIQUIS) 5 MG TABS tablet, Take 1 tablet (5 mg total) by mouth 2 (two) times daily., Disp: 60 tablet, Rfl: 6 No current facility-administered medications for this visit.  Facility-Administered Medications Ordered in Other Visits:    magnesium sulfate 2 GM/50ML IVPB, , , ,    Allergies: No Known Allergies  REVIEW OF SYSTEMS:   Review of Systems  Constitutional:  Negative for chills, fatigue and fever.  HENT:   Positive for nosebleeds (W O2). Negative for lump/mass, mouth sores, sore throat and trouble swallowing.   Eyes:  Negative for eye problems.  Respiratory:  Positive for shortness of breath (W exertion). Negative for cough.   Cardiovascular:  Negative for chest pain, leg swelling and palpitations.  Gastrointestinal:  Negative for abdominal pain, constipation, diarrhea, nausea and vomiting.  Genitourinary:  Negative for bladder incontinence, difficulty urinating,  dysuria, frequency, hematuria and nocturia.   Musculoskeletal:  Negative for arthralgias, back pain, flank pain, myalgias and neck pain.  Skin:  Negative for itching and  rash.  Neurological:  Positive for numbness. Negative for dizziness and headaches. Seizures: Burning toes. Hematological:  Does not bruise/bleed easily.  Psychiatric/Behavioral:  Negative for depression, sleep disturbance and suicidal ideas. The patient is not nervous/anxious.   All other systems reviewed and are negative.    VITALS:   There were no vitals taken for this visit.  Wt Readings from Last 3 Encounters:  05/15/22 240 lb (108.9 kg)  04/24/22 239 lb 9.6 oz (108.7 kg)  04/20/22 240 lb (108.9 kg)    There is no height or weight on file to calculate BMI.  Performance status (ECOG): 1 - Symptomatic but completely ambulatory  PHYSICAL EXAM:   Physical Exam Vitals and nursing note reviewed. Exam conducted with a chaperone present.  Constitutional:      Appearance: Normal appearance.  Cardiovascular:     Rate and Rhythm: Normal rate and regular rhythm.     Pulses: Normal pulses.     Heart sounds: Normal heart sounds.  Pulmonary:     Effort: Pulmonary effort is normal.     Breath sounds: Normal breath sounds.  Abdominal:     Palpations: Abdomen is soft. There is no hepatomegaly, splenomegaly or mass.     Tenderness: There is no abdominal tenderness.  Musculoskeletal:     Right lower leg: No edema.     Left lower leg: No edema.  Lymphadenopathy:     Cervical: No cervical adenopathy.     Right cervical: No superficial, deep or posterior cervical adenopathy.    Left cervical: No superficial, deep or posterior cervical adenopathy.     Upper Body:     Right upper body: No supraclavicular or axillary adenopathy.     Left upper body: No supraclavicular or axillary adenopathy.  Neurological:     General: No focal deficit present.     Mental Status: She is alert and oriented to person, place, and time.  Psychiatric:        Mood and Affect: Mood normal.        Behavior: Behavior normal.     LABS:      Latest Ref Rng & Units 05/15/2022    8:47 AM 04/24/2022   10:02 AM  03/30/2022    8:02 AM  CBC  WBC 4.0 - 10.5 K/uL 4.8  4.6  4.5   Hemoglobin 12.0 - 15.0 g/dL 9.6  10.2  10.1   Hematocrit 36.0 - 46.0 % 31.6  33.3  33.5   Platelets 150 - 400 K/uL 456  326  315       Latest Ref Rng & Units 05/15/2022    8:47 AM 04/24/2022   10:02 AM 03/30/2022    8:02 AM  CMP  Glucose 70 - 99 mg/dL 103  98  99   BUN 8 - 23 mg/dL '7  11  10   '$ Creatinine 0.44 - 1.00 mg/dL 0.88  0.98  0.88   Sodium 135 - 145 mmol/L 140  137  140   Potassium 3.5 - 5.1 mmol/L 3.8  4.4  3.9   Chloride 98 - 111 mmol/L 105  103  106   CO2 22 - 32 mmol/L '24  25  26   '$ Calcium 8.9 - 10.3 mg/dL 8.3  8.8  8.6   Total Protein 6.5 - 8.1 g/dL 6.4  6.4  6.8  Total Bilirubin 0.3 - 1.2 mg/dL 0.4  <0.1  0.2   Alkaline Phos 38 - 126 U/L 88  80  86   AST 15 - 41 U/L '20  25  26   '$ ALT 0 - 44 U/L '13  17  18      '$ No results found for: "CEA1", "CEA" / No results found for: "CEA1", "CEA" No results found for: "PSA1" No results found for: "[id number]" No results found for: "CAN125"  No results found for: "TOTALPROTELP", "ALBUMINELP", "A1GS", "A2GS", "BETS", "BETA2SER", "GAMS", "MSPIKE", "SPEI" No results found for: "TIBC", "FERRITIN", "IRONPCTSAT" No results found for: "LDH"   STUDIES:   CT CHEST ABDOMEN PELVIS W CONTRAST  Result Date: 05/09/2022 CLINICAL DATA:  Monitoring, restaging non-small-cell lung cancer. Prior chemotherapy. * Tracking Code: BO * EXAM: CT CHEST, ABDOMEN, AND PELVIS WITH CONTRAST TECHNIQUE: Multidetector CT imaging of the chest, abdomen and pelvis was performed following the standard protocol during bolus administration of intravenous contrast. RADIATION DOSE REDUCTION: This exam was performed according to the departmental dose-optimization program which includes automated exposure control, adjustment of the mA and/or kV according to patient size and/or use of iterative reconstruction technique. CONTRAST:  137m OMNIPAQUE IOHEXOL 300 MG/ML  SOLN COMPARISON:  01/31/2022 CT scan and older  FINDINGS: CT CHEST FINDINGS Cardiovascular: Right upper chest port. Fluid in the pericardial recess and pericardial effusion is stable. Calcifications along the mitral valve annulus, the aortic valve. Scattered vascular calcifications along the thoracic aorta as well. Some enlargement of the main pulmonary arteries. Please correlate for any evidence of pulmonary artery hypertension. Mediastinum/Nodes: No specific abnormal lymph node enlargement identified in the axillary region, hilum or mediastinum. Slight wall thickening along the distal esophagus, unchanged from previous. Proximal esophagus is mildly patulous. Lungs/Pleura: Right lung is without pneumothorax or effusion. There is slight increase in mild ground-glass along the right lower lobe of the lung there is some medial areas of interstitial septal thickening and bronchiectasis which is stable. There is some mild ground-glass patchy in the middle lobe as well. Subtle areas in the right upper lobe are similar previous. Some of this could be air trapping. The subpleural area of nodularity peripherally in the right upper lobe which previously measured 15 x 13 mm, today on series 4, image 60 measures 13 x 12 mm. Punctate right apical nodule on image 32 is stable as well. Few other punctate nodules elsewhere are similar. Left lung has volume loss. There is opacity along the left perihilar region with distortion, bronchiectasis extending out to the pleura margin laterally with pleural thickening. This is similar in appearance to previous examination. Slight increase in patchy ground-glass along the left lower lobe at the costophrenic angle region. Musculoskeletal: Streak artifact with the patient's left hip arthroplasty. There are degenerative changes of the right shoulder and along the thoracic spine. CT ABDOMEN PELVIS FINDINGS Hepatobiliary: Stable biliary ductal dilatation. Prior cholecystectomy. Patent portal vein. Benign cysts are seen in the liver. There is  a focal lesion identified in the right hepatic lobe, segment 8 on the prior measuring 4.1 by 4.1 cm which is centrally low dense. Today this lesion is less well-defined along its margins but is lower in density overall. Estimated dimensions today on series 2, image 55 is 4.2 by 3.8 cm. Areas seen just caudal to this is also lower in density than prior and on series 2 image 60 would measure 3.2 by 2.7 cm and previously 4.4 x 3.3 cm. No other space-occupying soft tissue mass in the  liver. The portal vein is patent. Pancreas: Unremarkable. No pancreatic ductal dilatation or surrounding inflammatory changes. Spleen: Normal in size without focal abnormality. Adrenals/Urinary Tract: Stable adrenal glands. Left is slightly thickened, nonspecific. No enhancing renal mass or collecting system dilatation. Focal atrophy along the posterior aspect of the right mid kidney. Ureters have normal course and caliber extending down to the bladder. Bladder is contracted. Stomach/Bowel: Scattered colonic stool. Diffuse colonic diverticulosis. There are several portions of the colon which are collapsed along the right side. Surgical changes from gastric bypass with the excluded stomach and surgical bypass connecting the GE junction to the small bowel with a Roux-en-Y anastomosis. No bowel dilatation. Protuberant anterior abdominal wall with focal hernia near the midline involving small bowel and fat. This is a wide mouth hernia. No obstruction, free air or free fluid. Vascular/Lymphatic: Normal caliber aorta and IVC with scattered vascular calcifications. No specific abnormal lymph node enlargement identified in the abdomen and pelvis. Reproductive: Dystrophic calcifications in the area of the uterus consistent with a calcified fibroid. No separate adnexal mass. Other: Mild anasarca. Musculoskeletal: Streak artifact related to patient's right hip arthroplasty. Curvature of the spine. Multilevel degenerative changes along the spine and  pelvis. Trace anterolisthesis of L4 on L5 and retrolisthesis of L5 on S1 IMPRESSION: Hepatic lesions are again identified and similar in size but are lower in density today compared to previous. No new liver lesions. Stable posttreatment changes along the left lung, perihilar with pleural thickening and opacity. Stable right-sided lung nodules. Recommend continued follow-up. No new developing mass lesion, fluid collection or lymph node enlargement. Colonic diverticulosis.  Surgical changes from gastric bypass. Fibroid uterus. Electronically Signed   By: Jill Side M.D.   On: 05/09/2022 16:31

## 2022-05-15 NOTE — Patient Instructions (Addendum)
[place] at [facility] Discharge Instructions   You were seen and examined today by Dr. Delton Coombes.  He reviewed the results of your lab work which are normal/stable. Your protein in your blood (albumin) is low at 2.8. Try to increase the protein in your diet. You may do this by drinking Boost or Ensure that has 30g of protein per can.   We will proceed with your treatment today.   Return as scheduled.    Thank you for choosing [place] at [facility] to provide your oncology and hematology care.  To afford each patient quality time with our provider, please arrive at least 15 minutes before your scheduled appointment time.   If you have a lab appointment with the [facility] please come in thru the Main Entrance and check in at the main information desk.  You need to re-schedule your appointment should you arrive 10 or more minutes late.  We strive to give you quality time with our providers, and arriving late affects you and other patients whose appointments are after yours.  Also, if you no show three or more times for appointments you may be dismissed from the clinic at the providers discretion.     Again, thank you for choosing [facility].  Our hope is that these requests will decrease the amount of time that you wait before being seen by our physicians.       _____________________________________________________________  Should you have questions after your visit to [facility], please contact our office at [phone] and follow the prompts.  Our office hours are 8:00 a.m. and 4:30 p.m. Monday - Friday.  Please note that voicemails left after 4:00 p.m. may not be returned until the following business day.  We are closed weekends and major holidays.  You do have access to a nurse 24-7, just call the main number to the clinic [phone] and do not press any options, hold on the line and a nurse will answer  the phone.    For prescription refill requests, have your pharmacy contact our office and allow 72 hours.    Due to Covid, you will need to wear a mask upon entering the hospital. If you do not have a mask, a mask will be given to you at the Main Entrance upon arrival. For doctor visits, patients may have 1 support person age 94 or older with them. For treatment visits, patients can not have anyone with them due to social distancing guidelines and our immunocompromised population.

## 2022-05-15 NOTE — Progress Notes (Signed)
Patient presents today for chemotherapy infusion.  Patient is in satisfactory condition with no new complaints voiced. Vital signs are stable.  Labs reviewed by Dr. Katragadda during her office visit.  All labs are within treatment parameters.  We will proceed with treatment per MD orders.   Patient tolerated treatment well with no complaints voiced.  Patient left via wheelchair in stable condition.  Vital signs stable at discharge.  Follow up as scheduled.    

## 2022-05-22 ENCOUNTER — Other Ambulatory Visit: Payer: Self-pay | Admitting: Family Medicine

## 2022-05-22 ENCOUNTER — Other Ambulatory Visit: Payer: Self-pay | Admitting: Hematology

## 2022-05-29 ENCOUNTER — Other Ambulatory Visit: Payer: Self-pay | Admitting: Hematology

## 2022-05-30 DIAGNOSIS — I872 Venous insufficiency (chronic) (peripheral): Secondary | ICD-10-CM | POA: Diagnosis not present

## 2022-06-04 NOTE — Progress Notes (Signed)
[facility] [address] [place] [address]    Clinic Day:  06/05/2022  Referring physician: Iona Beard, MD  Patient Care Team: Iona Beard, MD as PCP - General (Family Medicine) Satira Sark, MD as PCP - Cardiology (Cardiology) Derek Jack, MD as Medical Oncologist (Medical Oncology)   ASSESSMENT & PLAN:   Assessment: 1.  Adenocarcinoma of left lung (HCC) -Chemoradiation therapy with carboplatin and paclitaxel from 01/07/2019 through 02/11/2019. -Consolidation immunotherapy with durvalumab from 03/19/2019 through 04/15/2018, held due to pneumonitis. -CT chest on 07/02/2019 shows left upper lobe lung mass measuring 2.6 x 2.0 cm.  It shows improvement in size. -MRI of the brain on 07/18/2019 showed left sphenoid wing meningioma measuring 2.6 x 2.0 x 2.6 cm unchanged.  No new enhancing intracranial lesion.  Increased left temporal white matter edema. -Durvalumab restarted on 07/24/2019. -She was evaluated by cardiology with a stress test.  EF was 46%.  She underwent cardiac catheterization which did not show any abnormalities. - 1 year of durvalumab completed on 07/23/2020. - Liver mass biopsy (08/08/2021): Adenocarcinoma, CK7 positive, negative for TTF-1, Napsin a, GATA3, ER, CK20, CDX2 - PET scan (08/25/2021): Right upper lobe lung nodule 9 mm, SUV 5.0.  Groundglass and solid nodule periphery of the right upper lobe 8 mm, SUV 2.45.  Right lobe liver mass 4.5 x 3.6 cm, SUV 7.78. - NGS testing: PD-L1 negative, TMB-low, MSI-stable, K-ras G12 D.  No other targetable mutations. - Carboplatin, pemetrexed and pembrolizumab 4 cycles from 09/22/2021 through 11/25/2021, followed by maintenance pemetrexed and pembrolizumab -  CT CAP (11/10/2021): Reduced size of liver mass and interval resolution of previously seen groundglass pulmonary nodules.  There is a new inflammatory right upper lobe nodule.  Plan: 1.  Metastatic adenocarcinoma of the lung to the liver and right  lung: - CT CAP on 05/09/2022: Stable liver lesions with no new lesions.  Stable posttreatment changes along the left lung, perihilar and pleural thickening and opacity.  Stable right-sided lung nodules. - She has tolerated last cycle of chemoimmunotherapy very well. - Reviewed labs today which showed normal LFTs and creatinine.  CBC was grossly normal. - Proceed with pembrolizumab and pemetrexed today in 3 weeks.  RTC 6 weeks for follow-up.  Will schedule her scans at next visit. - For her normocytic anemia, I will check ferritin and iron panel.   2.  Meningioma: - MRI brain on 04/03/2022: Stable sphenoid wing meningioma on the left with decreasing vasogenic edema.  3.  Unprovoked pulmonary embolism: - Continue Eliquis 5 mg twice daily indefinitely.  No bleeding issues.   4.  Leg swellings: - Continue Lasix 20 mg in the morning send potassium 20 mEq daily.   5.  Dry eyes with itching: - Continue refresh eyedrops which are helping.  Orders Placed This Encounter  Procedures   CT CHEST ABDOMEN PELVIS W CONTRAST    Standing Status:   Future    Standing Expiration Date:   06/04/2023    Order Specific Question:   If indicated for the ordered procedure, I authorize the administration of contrast media per Radiology protocol    Answer:   Yes    Order Specific Question:   Does the patient have a contrast media/X-ray dye allergy?    Answer:   No    Order Specific Question:   Preferred imaging location?    Answer:   [facility]    Order Specific Question:   Is Oral Contrast requested for this  exam?    Answer:   Yes, Per Radiology protocol   Iron and TIBC ([facility]/[place] ONLY)   Ferritin      I,Alexis Herring,acting as a scribe for Derek Jack, MD.,have documented all relevant documentation on the behalf of Derek Jack, MD,as directed by  Derek Jack, MD while in the presence of Derek Jack, MD.  I, Derek Jack MD, have reviewed  the above documentation for accuracy and completeness, and I agree with the above.    Derek Jack, MD   3/18/20245:56 PM  CHIEF COMPLAINT:   Diagnosis: stage IV adenocarcinoma of the left lung, negative for targetable mutations     Cancer Staging  Adenocarcinoma of left lung [facility]) Staging form: Lung, AJCC 8th Edition - Clinical stage from 01/02/2019: Stage IIIB (cT3, cN2, cM0) - Signed by Derek Jack, MD on 01/02/2019 - Pathologic stage from 09/07/2021: Stage IVA (pTX, pNX, pM1b) - Unsigned    Prior Therapy: 1. Chemoradiation with carboplatin and paclitaxel from 01/07/2019 to 02/11/2019. 2. Consolidation with durvalumab from 03/19/2019 to 04/16/2019, held due to pneumonitis.    Current Therapy:  Pemetrexed and pembrolizumab maintenance    HISTORY OF PRESENT ILLNESS:   Oncology History  Adenocarcinoma of left lung ([place])  12/09/2018 Initial Diagnosis   Adenocarcinoma of left lung ([place])   01/02/2019 Cancer Staging   Staging form: Lung, AJCC 8th Edition - Clinical stage from 01/02/2019: Stage IIIB (cT3, cN2, cM0) - Signed by Derek Jack, MD on 01/02/2019   01/07/2019 - 02/11/2019 Chemotherapy   The patient had palonosetron (ALOXI) injection 0.25 mg, 0.25 mg, Intravenous,  Once, 6 of 6 cycles Administration: 0.25 mg (01/07/2019), 0.25 mg (01/14/2019), 0.25 mg (01/21/2019), 0.25 mg (01/28/2019), 0.25 mg (02/04/2019), 0.25 mg (02/11/2019) CARBOplatin (PARAPLATIN) 270 mg in sodium chloride 0.9 % 250 mL chemo infusion, 270 mg (100 % of original dose 266.4 mg), Intravenous,  Once, 6 of 6 cycles Dose modification:   (original dose 266.4 mg, Cycle 1),   (original dose 266.4 mg, Cycle 2),   (original dose 266.4 mg, Cycle 3),   (original dose 266.4 mg, Cycle 4) Administration: 270 mg (01/07/2019), 270 mg (01/14/2019), 270 mg (01/21/2019), 270 mg (01/28/2019), 270 mg (02/04/2019), 270 mg (02/11/2019) PACLitaxel (TAXOL) 108 mg in sodium chloride 0.9 % 250 mL chemo  infusion (</= 80mg /m2), 45 mg/m2 = 108 mg, Intravenous,  Once, 6 of 6 cycles Administration: 108 mg (01/07/2019), 108 mg (01/14/2019), 108 mg (01/21/2019), 108 mg (01/28/2019), 108 mg (02/04/2019), 108 mg (02/11/2019) fosaprepitant (EMEND) 150 mg, dexamethasone (DECADRON) 12 mg in sodium chloride 0.9 % 145 mL IVPB, , Intravenous,  Once, 5 of 5 cycles Administration:  (01/14/2019),  (01/21/2019),  (01/28/2019),  (02/04/2019),  (02/11/2019)  for chemotherapy treatment.    03/19/2019 - 07/23/2020 Chemotherapy   Patient is on Treatment Plan : LUNG DURVALUMAB [id number]     09/22/2021 - 11/04/2021 Chemotherapy   Patient is on Treatment Plan : LUNG Carboplatin (5) + Pemetrexed (500) + Pembrolizumab (200) D1 q21d Induction x 4 cycles / Maintenance Pemetrexed (500) + Pembrolizumab (200) D1 q21d     09/22/2021 -  Chemotherapy   Patient is on Treatment Plan : LUNG Carboplatin (5) + Pemetrexed (500) + Pembrolizumab (200) D1 q21d Induction x 4 cycles / Maintenance Pemetrexed (500) + Pembrolizumab (200) D1 q21d        INTERVAL HISTORY:   Chau is a 72 y.o. female presenting to clinic today for follow up of stage IV adenocarcinoma of the left lung, negative for targetable mutations. She  was last seen by me on 05/15/22.  Today, she states that she is doing well overall. Her appetite level is at 100%. Her energy level is at 50%.   PAST MEDICAL HISTORY:   Past Medical History: Past Medical History:  Diagnosis Date   Anemia    Aortic stenosis    Arthritis    Coronary artery calcification seen on CT scan    Essential hypertension    GERD (gastroesophageal reflux disease)    History of lung cancer    Stage III adenocarcinoma status post chemoradiation   Port-A-Cath in place 01/06/2019   Type 2 diabetes mellitus ([place])     Surgical History: Past Surgical History:  Procedure Laterality Date   CHOLECYSTECTOMY  1997   COLONOSCOPY     GASTRIC BYPASS     INCISIONAL HERNIA REPAIR  04/11/11   IR IMAGING GUIDED  PORT INSERTION  12/27/2018   Right   IR US GUIDE BX ASP/DRAIN  08/08/2021   LAPAROSCOPIC SALPINGOOPHERECTOMY     LAPAROTOMY  04/11/2011   Procedure: EXPLORATORY LAPAROTOMY;  Surgeon: Joyice Faster. Cornett, MD;  Location: [facility];  Service: General;  Laterality: N/A;  closure port hole   RIGHT/LEFT HEART CATH AND CORONARY ANGIOGRAPHY N/A 12/29/2019   Procedure: RIGHT/LEFT HEART CATH AND CORONARY ANGIOGRAPHY;  Surgeon: Troy Sine, MD;  Location: [facility];  Service: Cardiovascular;  Laterality: N/A;   TOTAL HIP ARTHROPLASTY  03/07/2012   Procedure: TOTAL HIP ARTHROPLASTY ANTERIOR APPROACH;  Surgeon: Mauri Pole, MD;  Location: [facility];  Service: Orthopedics;  Laterality: Right;   TOTAL SHOULDER ARTHROPLASTY Left 01/21/2015   TOTAL SHOULDER ARTHROPLASTY Left 01/21/2015   Procedure: LEFT TOTAL SHOULDER ARTHROPLASTY;  Surgeon: Justice Britain, MD;  Location: [facility];  Service: Orthopedics;  Laterality: Left;   VAGINAL HYSTERECTOMY      Social History: Social History   Socioeconomic History   Marital status: Single    Spouse name: Not on file   Number of children: Not on file   Years of education: 12th grade   Highest education level: Not on file  Occupational History   Occupation: Employed    Employer: [employer]  Tobacco Use   Smoking status: Former    Packs/day: 1.00    Years: 12.00    Additional pack years: 0.00    Total pack years: 12.00    Types: Cigarettes    Quit date: 03/20/1976    Years since quitting: 46.2   Smokeless tobacco: Never  Vaping Use   Vaping Use: Never used  Substance and Sexual Activity   Alcohol use: No   Drug use: No   Sexual activity: Not Currently  Other Topics Concern   Not on file  Social History Narrative   Not on file   Social Determinants of Health   Financial Resource Strain: Low Risk  (12/06/2018)   Overall Financial Resource Strain (CARDIA)    Difficulty of Paying Living Expenses: Not very hard  Food Insecurity: No Food  Insecurity (12/06/2018)   Hunger Vital Sign    Worried About Running Out of Food in the Last Year: Never true    Ran Out of Food in the Last Year: Never true  Transportation Needs: No Transportation Needs (12/06/2018)   PRAPARE - Hydrologist (Medical): No    Lack of Transportation (Non-Medical): No  Physical Activity: Inactive (12/06/2018)   Exercise Vital Sign    Days of Exercise per Week: 0 days  Minutes of Exercise per Session: 0 min  Stress: No Stress Concern Present (12/06/2018)   [place]    Feeling of Stress : Only a little  Social Connections: Moderately Isolated (12/06/2018)   Social Connection and Isolation Panel [NHANES]    Frequency of Communication with Friends and Family: More than three times a week    Frequency of Social Gatherings with Friends and Family: Once a week    Attends Religious Services: More than 4 times per year    Active Member of [employer] or Organizations: No    Attends Archivist Meetings: Never    Marital Status: Never married  Intimate Partner Violence: Not At Risk (12/06/2018)   Humiliation, Afraid, Rape, and Kick questionnaire    Fear of Current or Ex-Partner: No    Emotionally Abused: No    Physically Abused: No    Sexually Abused: No    Family History: Family History  Problem Relation Age of Onset   Breast cancer Mother    COPD Mother    Arthritis Mother    Diabetes Mother    Hypertension Mother    Hypertension Father    Diabetes Father    Breast cancer Sister    Thyroid cancer Brother    Huntington's disease Maternal Grandmother    Heart attack Brother     Current Medications:  Current Outpatient Medications:    augmented betamethasone dipropionate (DIPROLENE-AF) 0.05 % cream, Apply 1 Application topically 2 (two) times daily., Disp: , Rfl:    Calcium Carbonate Antacid (TUMS PO), Take 4 tablets by mouth daily., Disp: , Rfl:     carvedilol (COREG) 3.125 MG tablet, TAKE 1 TABLET BY MOUTH 2 TIMES DAILY., Disp: 180 tablet, Rfl: 1   Cyanocobalamin (VITAMIN B-12) 2500 MCG SUBL, Place 2,500 mcg under the tongue every morning. , Disp: , Rfl:    diphenhydrAMINE (BENADRYL) 25 MG tablet, Take 25 mg by mouth every 6 (six) hours as needed for itching or allergies., Disp: , Rfl:    ELIQUIS 5 MG TABS tablet, TAKE 1 TABLET BY MOUTH TWICE A DAY, Disp: 60 tablet, Rfl: 6   ferrous sulfate 325 (65 FE) MG tablet, Take 325 mg by mouth every evening., Disp: , Rfl:    fluticasone (FLONASE) 50 MCG/ACT nasal spray, Place 1 spray into both nostrils 2 (two) times daily., Disp: 16 g, Rfl: 2   folic acid (FOLVITE) 1 MG tablet, TAKE 1 TABLET BY MOUTH EVERY DAY, Disp: 90 tablet, Rfl: 1   furosemide (LASIX) 20 MG tablet, Take 20 mg by mouth 2 (two) times daily., Disp: , Rfl:    gabapentin (NEURONTIN) 300 MG capsule, Take 300 mg by mouth 2 (two) times daily., Disp: , Rfl:    Multiple Vitamin (MULITIVITAMIN WITH MINERALS) TABS, Take 1 tablet by mouth daily., Disp: , Rfl:    omeprazole (PRILOSEC) 20 MG capsule, Take 20 mg by mouth daily., Disp: , Rfl:    Potassium Chloride ER 20 MEQ TBCR, Take 1 tablet by mouth 2 (two) times daily., Disp: , Rfl:  No current facility-administered medications for this visit.  Facility-Administered Medications Ordered in Other Visits:    magnesium sulfate 2 GM/50ML IVPB, , , ,    sodium chloride flush (NS) 0.9 % injection 10 mL, 10 mL, Intracatheter, PRN, Derek Jack, MD, 10 mL at 06/05/22 1257   Allergies: No Known Allergies  REVIEW OF SYSTEMS:   Review of Systems  Constitutional:  Negative for chills, fatigue  and fever.  HENT:   Negative for lump/mass, mouth sores, nosebleeds, sore throat and trouble swallowing.   Eyes:  Negative for eye problems.  Respiratory:  Positive for shortness of breath. Negative for cough.   Cardiovascular:  Negative for chest pain, leg swelling and palpitations.   Gastrointestinal:  Negative for abdominal pain, constipation, diarrhea, nausea and vomiting.  Genitourinary:  Negative for bladder incontinence, difficulty urinating, dysuria, frequency, hematuria and nocturia.   Musculoskeletal:  Negative for arthralgias, back pain, flank pain, myalgias and neck pain.  Skin:  Negative for itching and rash.  Neurological:  Positive for dizziness and numbness. Negative for headaches.  Hematological:  Does not bruise/bleed easily.  Psychiatric/Behavioral:  Negative for depression, sleep disturbance and suicidal ideas. The patient is not nervous/anxious.   All other systems reviewed and are negative.    VITALS:   There were no vitals taken for this visit.  Wt Readings from Last 3 Encounters:  06/05/22 239 lb 1.6 oz (108.5 kg)  05/15/22 240 lb (108.9 kg)  04/24/22 239 lb 9.6 oz (108.7 kg)    There is no height or weight on file to calculate BMI.  Performance status (ECOG): 1 - Symptomatic but completely ambulatory  PHYSICAL EXAM:   Physical Exam Vitals and nursing note reviewed. Exam conducted with a chaperone present.  Constitutional:      Appearance: Normal appearance.  Cardiovascular:     Rate and Rhythm: Normal rate and regular rhythm.     Pulses: Normal pulses.     Heart sounds: Normal heart sounds.  Pulmonary:     Effort: Pulmonary effort is normal.     Breath sounds: Normal breath sounds.  Abdominal:     Palpations: Abdomen is soft. There is no hepatomegaly, splenomegaly or mass.     Tenderness: There is no abdominal tenderness.  Musculoskeletal:     Right lower leg: No edema.     Left lower leg: No edema.  Lymphadenopathy:     Cervical: No cervical adenopathy.     Right cervical: No superficial, deep or posterior cervical adenopathy.    Left cervical: No superficial, deep or posterior cervical adenopathy.     Upper Body:     Right upper body: No supraclavicular or axillary adenopathy.     Left upper body: No supraclavicular or  axillary adenopathy.  Neurological:     General: No focal deficit present.     Mental Status: She is alert and oriented to person, place, and time.  Psychiatric:        Mood and Affect: Mood normal.        Behavior: Behavior normal.     LABS:      Latest Ref Rng & Units 06/05/2022    9:16 AM 05/15/2022    8:47 AM 04/24/2022   10:02 AM  CBC  WBC 4.0 - 10.5 K/uL 4.8  4.8  4.6   Hemoglobin 12.0 - 15.0 g/dL 9.4  9.6  10.2   Hematocrit 36.0 - 46.0 % 31.7  31.6  33.3   Platelets 150 - 400 K/uL 408  456  326       Latest Ref Rng & Units 06/05/2022    9:16 AM 05/15/2022    8:47 AM 04/24/2022   10:02 AM  CMP  Glucose 70 - 99 mg/dL 117  103  98   BUN 8 - 23 mg/dL 12  7  11    Creatinine 0.44 - 1.00 mg/dL 0.93  0.88  0.98   Sodium 135 -  145 mmol/L 142  140  137   Potassium 3.5 - 5.1 mmol/L 3.9  3.8  4.4   Chloride 98 - 111 mmol/L 106  105  103   CO2 22 - 32 mmol/L 27  24  25    Calcium 8.9 - 10.3 mg/dL 8.5  8.3  8.8   Total Protein 6.5 - 8.1 g/dL 6.3  6.4  6.4   Total Bilirubin 0.3 - 1.2 mg/dL 0.4  0.4  <0.1   Alkaline Phos 38 - 126 U/L 87  88  80   AST 15 - 41 U/L 22  20  25    ALT 0 - 44 U/L 15  13  17       No results found for: "CEA1", "CEA" / No results found for: "CEA1", "CEA" No results found for: "PSA1" No results found for: "[id number]" No results found for: "CAN125"  No results found for: "TOTALPROTELP", "ALBUMINELP", "A1GS", "A2GS", "BETS", "BETA2SER", "GAMS", "MSPIKE", "SPEI" Lab Results  Component Value Date   TIBC 227 (L) 06/05/2022   FERRITIN 310 (H) 06/05/2022   IRONPCTSAT 17 06/05/2022   No results found for: "LDH"   STUDIES:   CT CHEST ABDOMEN PELVIS W CONTRAST  Result Date: 05/09/2022 CLINICAL DATA:  Monitoring, restaging non-small-cell lung cancer. Prior chemotherapy. * Tracking Code: BO * EXAM: CT CHEST, ABDOMEN, AND PELVIS WITH CONTRAST TECHNIQUE: Multidetector CT imaging of the chest, abdomen and pelvis was performed following the standard protocol during  bolus administration of intravenous contrast. RADIATION DOSE REDUCTION: This exam was performed according to the departmental dose-optimization program which includes automated exposure control, adjustment of the mA and/or kV according to patient size and/or use of iterative reconstruction technique. CONTRAST:  13mL OMNIPAQUE IOHEXOL 300 MG/ML  SOLN COMPARISON:  01/31/2022 CT scan and older FINDINGS: CT CHEST FINDINGS Cardiovascular: Right upper chest port. Fluid in the pericardial recess and pericardial effusion is stable. Calcifications along the mitral valve annulus, the aortic valve. Scattered vascular calcifications along the thoracic aorta as well. Some enlargement of the main pulmonary arteries. Please correlate for any evidence of pulmonary artery hypertension. Mediastinum/Nodes: No specific abnormal lymph node enlargement identified in the axillary region, hilum or mediastinum. Slight wall thickening along the distal esophagus, unchanged from previous. Proximal esophagus is mildly patulous. Lungs/Pleura: Right lung is without pneumothorax or effusion. There is slight increase in mild ground-glass along the right lower lobe of the lung there is some medial areas of interstitial septal thickening and bronchiectasis which is stable. There is some mild ground-glass patchy in the middle lobe as well. Subtle areas in the right upper lobe are similar previous. Some of this could be air trapping. The subpleural area of nodularity peripherally in the right upper lobe which previously measured 15 x 13 mm, today on series 4, image 60 measures 13 x 12 mm. Punctate right apical nodule on image 32 is stable as well. Few other punctate nodules elsewhere are similar. Left lung has volume loss. There is opacity along the left perihilar region with distortion, bronchiectasis extending out to the pleura margin laterally with pleural thickening. This is similar in appearance to previous examination. Slight increase in patchy  ground-glass along the left lower lobe at the costophrenic angle region. Musculoskeletal: Streak artifact with the patient's left hip arthroplasty. There are degenerative changes of the right shoulder and along the thoracic spine. CT ABDOMEN PELVIS FINDINGS Hepatobiliary: Stable biliary ductal dilatation. Prior cholecystectomy. Patent portal vein. Benign cysts are seen in the liver. There is a focal lesion  identified in the right hepatic lobe, segment 8 on the prior measuring 4.1 by 4.1 cm which is centrally low dense. Today this lesion is less well-defined along its margins but is lower in density overall. Estimated dimensions today on series 2, image 55 is 4.2 by 3.8 cm. Areas seen just caudal to this is also lower in density than prior and on series 2 image 60 would measure 3.2 by 2.7 cm and previously 4.4 x 3.3 cm. No other space-occupying soft tissue mass in the liver. The portal vein is patent. Pancreas: Unremarkable. No pancreatic ductal dilatation or surrounding inflammatory changes. Spleen: Normal in size without focal abnormality. Adrenals/Urinary Tract: Stable adrenal glands. Left is slightly thickened, nonspecific. No enhancing renal mass or collecting system dilatation. Focal atrophy along the posterior aspect of the right mid kidney. Ureters have normal course and caliber extending down to the bladder. Bladder is contracted. Stomach/Bowel: Scattered colonic stool. Diffuse colonic diverticulosis. There are several portions of the colon which are collapsed along the right side. Surgical changes from gastric bypass with the excluded stomach and surgical bypass connecting the GE junction to the small bowel with a Roux-en-Y anastomosis. No bowel dilatation. Protuberant anterior abdominal wall with focal hernia near the midline involving small bowel and fat. This is a wide mouth hernia. No obstruction, free air or free fluid. Vascular/Lymphatic: Normal caliber aorta and IVC with scattered vascular  calcifications. No specific abnormal lymph node enlargement identified in the abdomen and pelvis. Reproductive: Dystrophic calcifications in the area of the uterus consistent with a calcified fibroid. No separate adnexal mass. Other: Mild anasarca. Musculoskeletal: Streak artifact related to patient's right hip arthroplasty. Curvature of the spine. Multilevel degenerative changes along the spine and pelvis. Trace anterolisthesis of L4 on L5 and retrolisthesis of L5 on S1 IMPRESSION: Hepatic lesions are again identified and similar in size but are lower in density today compared to previous. No new liver lesions. Stable posttreatment changes along the left lung, perihilar with pleural thickening and opacity. Stable right-sided lung nodules. Recommend continued follow-up. No new developing mass lesion, fluid collection or lymph node enlargement. Colonic diverticulosis.  Surgical changes from gastric bypass. Fibroid uterus. Electronically Signed   By: Jill Side M.D.   On: 05/09/2022 16:31

## 2022-06-05 ENCOUNTER — Inpatient Hospital Stay: Payer: Medicare HMO | Attending: Neurosurgery

## 2022-06-05 ENCOUNTER — Inpatient Hospital Stay: Payer: Medicare HMO

## 2022-06-05 ENCOUNTER — Inpatient Hospital Stay (HOSPITAL_BASED_OUTPATIENT_CLINIC_OR_DEPARTMENT_OTHER): Payer: Medicare HMO | Admitting: Hematology

## 2022-06-05 ENCOUNTER — Ambulatory Visit: Payer: Medicare HMO | Admitting: Hematology

## 2022-06-05 ENCOUNTER — Other Ambulatory Visit: Payer: Medicare HMO

## 2022-06-05 VITALS — BP 110/52 | HR 81 | Temp 97.6°F | Resp 17

## 2022-06-05 DIAGNOSIS — C3492 Malignant neoplasm of unspecified part of left bronchus or lung: Secondary | ICD-10-CM | POA: Diagnosis not present

## 2022-06-05 DIAGNOSIS — D508 Other iron deficiency anemias: Secondary | ICD-10-CM | POA: Diagnosis not present

## 2022-06-05 DIAGNOSIS — C787 Secondary malignant neoplasm of liver and intrahepatic bile duct: Secondary | ICD-10-CM | POA: Insufficient documentation

## 2022-06-05 DIAGNOSIS — Z79620 Long term (current) use of immunosuppressive biologic: Secondary | ICD-10-CM | POA: Diagnosis not present

## 2022-06-05 DIAGNOSIS — C3412 Malignant neoplasm of upper lobe, left bronchus or lung: Secondary | ICD-10-CM | POA: Diagnosis not present

## 2022-06-05 DIAGNOSIS — R6889 Other general symptoms and signs: Secondary | ICD-10-CM | POA: Insufficient documentation

## 2022-06-05 DIAGNOSIS — I2699 Other pulmonary embolism without acute cor pulmonale: Secondary | ICD-10-CM | POA: Diagnosis not present

## 2022-06-05 DIAGNOSIS — D329 Benign neoplasm of meninges, unspecified: Secondary | ICD-10-CM | POA: Diagnosis not present

## 2022-06-05 DIAGNOSIS — Z5112 Encounter for antineoplastic immunotherapy: Secondary | ICD-10-CM | POA: Insufficient documentation

## 2022-06-05 DIAGNOSIS — Z7901 Long term (current) use of anticoagulants: Secondary | ICD-10-CM | POA: Insufficient documentation

## 2022-06-05 DIAGNOSIS — Z452 Encounter for adjustment and management of vascular access device: Secondary | ICD-10-CM | POA: Diagnosis not present

## 2022-06-05 DIAGNOSIS — D649 Anemia, unspecified: Secondary | ICD-10-CM | POA: Insufficient documentation

## 2022-06-05 DIAGNOSIS — C3490 Malignant neoplasm of unspecified part of unspecified bronchus or lung: Secondary | ICD-10-CM | POA: Diagnosis not present

## 2022-06-05 DIAGNOSIS — Z5111 Encounter for antineoplastic chemotherapy: Secondary | ICD-10-CM | POA: Diagnosis not present

## 2022-06-05 LAB — COMPREHENSIVE METABOLIC PANEL
ALT: 15 U/L (ref 0–44)
AST: 22 U/L (ref 15–41)
Albumin: 2.8 g/dL — ABNORMAL LOW (ref 3.5–5.0)
Alkaline Phosphatase: 87 U/L (ref 38–126)
Anion gap: 9 (ref 5–15)
BUN: 12 mg/dL (ref 8–23)
CO2: 27 mmol/L (ref 22–32)
Calcium: 8.5 mg/dL — ABNORMAL LOW (ref 8.9–10.3)
Chloride: 106 mmol/L (ref 98–111)
Creatinine, Ser: 0.93 mg/dL (ref 0.44–1.00)
GFR, Estimated: >60 mL/min (ref >60)
Glucose, Bld: 117 mg/dL — ABNORMAL HIGH (ref 70–99)
Potassium: 3.9 mmol/L (ref 3.5–5.1)
Sodium: 142 mmol/L (ref 135–145)
Total Bilirubin: 0.4 mg/dL (ref 0.3–1.2)
Total Protein: 6.3 g/dL — ABNORMAL LOW (ref 6.5–8.1)

## 2022-06-05 LAB — IRON AND TIBC
Iron: 39 ug/dL (ref 28–170)
Saturation Ratios: 17 % (ref 10.4–31.8)
TIBC: 227 ug/dL — ABNORMAL LOW (ref 250–450)
UIBC: 188 ug/dL

## 2022-06-05 LAB — CBC WITH DIFFERENTIAL/PLATELET
Abs Immature Granulocytes: 0.02 10*3/uL (ref 0.00–0.07)
Basophils Absolute: 0 10*3/uL (ref 0.0–0.1)
Basophils Relative: 1 %
Eosinophils Absolute: 0.1 10*3/uL (ref 0.0–0.5)
Eosinophils Relative: 2 %
HCT: 31.7 % — ABNORMAL LOW (ref 36.0–46.0)
Hemoglobin: 9.4 g/dL — ABNORMAL LOW (ref 12.0–15.0)
Immature Granulocytes: 0 %
Lymphocytes Relative: 12 %
Lymphs Abs: 0.6 10*3/uL — ABNORMAL LOW (ref 0.7–4.0)
MCH: 28.5 pg (ref 26.0–34.0)
MCHC: 29.7 g/dL — ABNORMAL LOW (ref 30.0–36.0)
MCV: 96.1 fL (ref 80.0–100.0)
Monocytes Absolute: 0.5 10*3/uL (ref 0.1–1.0)
Monocytes Relative: 10 %
Neutro Abs: 3.6 10*3/uL (ref 1.7–7.7)
Neutrophils Relative %: 75 %
Platelets: 408 10*3/uL — ABNORMAL HIGH (ref 150–400)
RBC: 3.3 MIL/uL — ABNORMAL LOW (ref 3.87–5.11)
RDW: 17.8 % — ABNORMAL HIGH (ref 11.5–15.5)
WBC: 4.8 10*3/uL (ref 4.0–10.5)
nRBC: 0 % (ref 0.0–0.2)

## 2022-06-05 LAB — MAGNESIUM: Magnesium: 1.9 mg/dL (ref 1.7–2.4)

## 2022-06-05 LAB — FERRITIN: Ferritin: 310 ng/mL — ABNORMAL HIGH (ref 11–307)

## 2022-06-05 LAB — TSH: TSH: 1.912 u[IU]/mL (ref 0.350–4.500)

## 2022-06-05 MED ORDER — ALTEPLASE 2 MG IJ SOLR
2.0000 mg | Freq: Once | INTRAMUSCULAR | Status: AC
  Administered 2022-06-05: 2 mg
  Filled 2022-06-05: qty 2

## 2022-06-05 MED ORDER — SODIUM CHLORIDE 0.9 % IV SOLN
200.0000 mg | Freq: Once | INTRAVENOUS | Status: AC
  Administered 2022-06-05: 200 mg via INTRAVENOUS
  Filled 2022-06-05: qty 8

## 2022-06-05 MED ORDER — CYANOCOBALAMIN 1000 MCG/ML IJ SOLN
1000.0000 ug | Freq: Once | INTRAMUSCULAR | Status: AC
  Administered 2022-06-05: 1000 ug via INTRAMUSCULAR
  Filled 2022-06-05: qty 1

## 2022-06-05 MED ORDER — SODIUM CHLORIDE 0.9 % IV SOLN
500.0000 mg/m2 | Freq: Once | INTRAVENOUS | Status: AC
  Administered 2022-06-05: 1100 mg via INTRAVENOUS
  Filled 2022-06-05: qty 40

## 2022-06-05 MED ORDER — SODIUM CHLORIDE 0.9 % IV SOLN: Freq: Once | INTRAVENOUS | Status: AC

## 2022-06-05 MED ORDER — SODIUM CHLORIDE 0.9% FLUSH
10.0000 mL | Freq: Once | INTRAVENOUS | Status: AC
  Administered 2022-06-05: 10 mL via INTRAVENOUS

## 2022-06-05 MED ORDER — ONDANSETRON HCL 4 MG/2ML IJ SOLN
8.0000 mg | Freq: Once | INTRAMUSCULAR | Status: AC
  Administered 2022-06-05: 8 mg via INTRAVENOUS
  Filled 2022-06-05: qty 4

## 2022-06-05 MED ORDER — SODIUM CHLORIDE 0.9% FLUSH
10.0000 mL | INTRAVENOUS | Status: DC | PRN
  Administered 2022-06-05: 10 mL

## 2022-06-05 MED ORDER — HEPARIN SOD (PORK) LOCK FLUSH 100 UNIT/ML IV SOLN
500.0000 [IU] | Freq: Once | INTRAVENOUS | Status: AC | PRN
  Administered 2022-06-05: 500 [IU]

## 2022-06-05 NOTE — Progress Notes (Signed)
Port accessed today, no blood return  after multiple attempts. Alteplase administered per protocol

## 2022-06-05 NOTE — Patient Instructions (Signed)
[facility] at [facility] Discharge Instructions   You were seen and examined today by Dr. Katragadda.  He reviewed the results of your lab work which are normal/stable.   We will proceed with your treatment today.  Return as scheduled.    Thank you for choosing [facility] at [facility] to provide your oncology and hematology care.  To afford each patient quality time with our provider, please arrive at least 15 minutes before your scheduled appointment time.   If you have a lab appointment with the Cancer Center please come in thru the Main Entrance and check in at the main information desk.  You need to re-schedule your appointment should you arrive 10 or more minutes late.  We strive to give you quality time with our providers, and arriving late affects you and other patients whose appointments are after yours.  Also, if you no show three or more times for appointments you may be dismissed from the clinic at the providers discretion.     Again, thank you for choosing [facility].  Our hope is that these requests will decrease the amount of time that you wait before being seen by our physicians.       _____________________________________________________________  Should you have questions after your visit to [facility], please contact our office at [phone] and follow the prompts.  Our office hours are 8:00 a.m. and 4:30 p.m. Monday - Friday.  Please note that voicemails left after 4:00 p.m. may not be returned until the following business day.  We are closed weekends and major holidays.  You do have access to a nurse 24-7, just call the main number to the clinic [phone] and do not press any options, hold on the line and a nurse will answer the phone.    For prescription refill requests, have your pharmacy contact our office and allow 72 hours.    Due to Covid, you will need to wear a mask upon entering  the hospital. If you do not have a mask, a mask will be given to you at the Main Entrance upon arrival. For doctor visits, patients may have 1 support person age 18 or older with them. For treatment visits, patients can not have anyone with them due to social distancing guidelines and our immunocompromised population.      

## 2022-06-05 NOTE — Progress Notes (Signed)
Patient presents today for chemotherapy infusion.  Patient is in satisfactory condition with no new complaints voiced.  Vital signs are stable.  Labs reviewed by Dr. Delton Coombes during her office visit.  All labs are within treatment parameters.  We will proceed with treatment per MD orders.   Good blood return noted from port.  15 mL of blood removed.  Port flushed well with good blood return noted.  Additional labs drawn from port.    Patient tolerated treatment well with no complaints voiced.  Patient left via wheelchair in stable condition.  Vital signs stable at discharge.  Follow up as scheduled.

## 2022-06-05 NOTE — Patient Instructions (Signed)
[facility]  Discharge Instructions: Thank you for choosing [facility] to provide your oncology and hematology care.  If you have a lab appointment with the Cancer Center, please come in thru the Main Entrance and check in at the main information desk.  Wear comfortable clothing and clothing appropriate for easy access to any Portacath or PICC line.   We strive to give you quality time with your provider. You may need to reschedule your appointment if you arrive late (15 or more minutes).  Arriving late affects you and other patients whose appointments are after yours.  Also, if you miss three or more appointments without notifying the office, you may be dismissed from the clinic at the provider's discretion.      For prescription refill requests, have your pharmacy contact our office and allow 72 hours for refills to be completed.    Today you received the following chemotherapy and/or immunotherapy agents Keytruda/Alimta.  Pembrolizumab Injection What is this medication? PEMBROLIZUMAB (PEM broe LIZ ue mab) treats some types of cancer. It works by helping your immune system slow or stop the spread of cancer cells. It is a monoclonal antibody. This medicine may be used for other purposes; ask your health care provider or pharmacist if you have questions. COMMON BRAND NAME(S): Keytruda What should I tell my care team before I take this medication? They need to know if you have any of these conditions: Allogeneic stem cell transplant (uses someone else's stem cells) Autoimmune diseases, such as Crohn disease, ulcerative colitis, lupus History of chest radiation Nervous system problems, such as Guillain-Barre syndrome, myasthenia gravis Organ transplant An unusual or allergic reaction to pembrolizumab, other medications, foods, dyes, or preservatives Pregnant or trying to get pregnant Breast-feeding How should I use this medication? This medication is  injected into a vein. It is given by your care team in a hospital or clinic setting. A special MedGuide will be given to you before each treatment. Be sure to read this information carefully each time. Talk to your care team about the use of this medication in children. While it may be prescribed for children as young as 6 months for selected conditions, precautions do apply. Overdosage: If you think you have taken too much of this medicine contact a poison control center or emergency room at once. NOTE: This medicine is only for you. Do not share this medicine with others. What if I miss a dose? Keep appointments for follow-up doses. It is important not to miss your dose. Call your care team if you are unable to keep an appointment. What may interact with this medication? Interactions have not been studied. This list may not describe all possible interactions. Give your health care provider a list of all the medicines, herbs, non-prescription drugs, or dietary supplements you use. Also tell them if you smoke, drink alcohol, or use illegal drugs. Some items may interact with your medicine. What should I watch for while using this medication? Your condition will be monitored carefully while you are receiving this medication. You may need blood work while taking this medication. This medication may cause serious skin reactions. They can happen weeks to months after starting the medication. Contact your care team right away if you notice fevers or flu-like symptoms with a rash. The rash may be red or purple and then turn into blisters or peeling of the skin. You may also notice a red rash with swelling of the face, lips, or lymph   nodes in your neck or under your arms. Tell your care team right away if you have any change in your eyesight. Talk to your care team if you may be pregnant. Serious birth defects can occur if you take this medication during pregnancy and for 4 months after the last dose. You  will need a negative pregnancy test before starting this medication. Contraception is recommended while taking this medication and for 4 months after the last dose. Your care team can help you find the option that works for you. Do not breastfeed while taking this medication and for 4 months after the last dose. What side effects may I notice from receiving this medication? Side effects that you should report to your care team as soon as possible: Allergic reactions--skin rash, itching, hives, swelling of the face, lips, tongue, or throat Dry cough, shortness of breath or trouble breathing Eye pain, redness, irritation, or discharge with blurry or decreased vision Heart muscle inflammation--unusual weakness or fatigue, shortness of breath, chest pain, fast or irregular heartbeat, dizziness, swelling of the ankles, feet, or hands Hormone gland problems--headache, sensitivity to light, unusual weakness or fatigue, dizziness, fast or irregular heartbeat, increased sensitivity to cold or heat, excessive sweating, constipation, hair loss, increased thirst or amount of urine, tremors or shaking, irritability Infusion reactions--chest pain, shortness of breath or trouble breathing, feeling faint or lightheaded Kidney injury (glomerulonephritis)--decrease in the amount of urine, red or dark Cal[place] urine, foamy or bubbly urine, swelling of the ankles, hands, or feet Liver injury--right upper belly pain, loss of appetite, nausea, light-colored stool, dark yellow or [place] urine, yellowing skin or eyes, unusual weakness or fatigue Pain, tingling, or numbness in the hands or feet, muscle weakness, change in vision, confusion or trouble speaking, loss of balance or coordination, trouble walking, seizures Rash, fever, and swollen lymph nodes Redness, blistering, peeling, or loosening of the skin, including inside the mouth Sudden or severe stomach pain, bloody diarrhea, fever, nausea, vomiting Side effects that  usually do not require medical attention (report to your care team if they continue or are bothersome): Bone, joint, or muscle pain Diarrhea Fatigue Loss of appetite Nausea Skin rash This list may not describe all possible side effects. Call your doctor for medical advice about side effects. You may report side effects to FDA at 1-800-FDA-1088. Where should I keep my medication? This medication is given in a hospital or clinic. It will not be stored at home. NOTE: This sheet is a summary. It may not cover all possible information. If you have questions about this medicine, talk to your doctor, pharmacist, or health care provider.  2023 Elsevier/Gold Standard (2021-07-19 00:00:00)    Pemetrexed Injection What is this medication? PEMETREXED (PEM e TREX ed) treats some types of cancer. It works by slowing down the growth of cancer cells. This medicine may be used for other purposes; ask your health care provider or pharmacist if you have questions. COMMON BRAND NAME(S): Alimta, PEMFEXY What should I tell my care team before I take this medication? They need to know if you have any of these conditions: Infection, such as chickenpox, cold sores, or herpes Kidney disease Low blood cell levels (white cells, red cells, and platelets) Lung or breathing disease, such as asthma Radiation therapy An unusual or allergic reaction to pemetrexed, other medications, foods, dyes, or preservatives If you or your partner are pregnant or trying to get pregnant Breast-feeding How should I use this medication? This medication is injected into a vein. It   is given by your care team in a hospital or clinic setting. Talk to your care team about the use of this medication in children. Special care may be needed. Overdosage: If you think you have taken too much of this medicine contact a poison control center or emergency room at once. NOTE: This medicine is only for you. Do not share this medicine with  others. What if I miss a dose? Keep appointments for follow-up doses. It is important not to miss your dose. Call your care team if you are unable to keep an appointment. What may interact with this medication? Do not take this medication with any of the following: Live virus vaccines This medication may also interact with the following: Ibuprofen This list may not describe all possible interactions. Give your health care provider a list of all the medicines, herbs, non-prescription drugs, or dietary supplements you use. Also tell them if you smoke, drink alcohol, or use illegal drugs. Some items may interact with your medicine. What should I watch for while using this medication? Your condition will be monitored carefully while you are receiving this medication. This medication may make you feel generally unwell. This is not uncommon as chemotherapy can affect healthy cells as well as cancer cells. Report any side effects. Continue your course of treatment even though you feel ill unless your care team tells you to stop. This medication can cause serious side effects. To reduce the risk, your care team may give you other medications to take before receiving this one. Be sure to follow the directions from your care team. This medication can cause a rash or redness in areas of the body that have previously had radiation therapy. If you have had radiation therapy, tell your care team if you notice a rash in this area. This medication may increase your risk of getting an infection. Call your care team for advice if you get a fever, chills, sore throat, or other symptoms of a cold or flu. Do not treat yourself. Try to avoid being around people who are sick. Be careful brushing or flossing your teeth or using a toothpick because you may get an infection or bleed more easily. If you have any dental work done, tell your dentist you are receiving this medication. Avoid taking medications that contain  aspirin, acetaminophen, ibuprofen, naproxen, or ketoprofen unless instructed by your care team. These medications may hide a fever. Check with your care team if you have severe diarrhea, nausea, and vomiting, or if you sweat a lot. The loss of too much body fluid may make it dangerous for you to take this medication. Talk to your care team if you or your partner wish to become pregnant or think either of you might be pregnant. This medication can cause serious birth defects if taken during pregnancy and for 6 months after the last dose. A negative pregnancy test is required before starting this medication. A reliable form of contraception is recommended while taking this medication and for 6 months after the last dose. Talk to your care team about reliable forms of contraception. Do not father a child while taking this medication and for 3 months after the last dose. Use a condom while having sex during this time period. Do not breastfeed while taking this medication and for 1 week after the last dose. This medication may cause infertility. Talk to your care team if you are concerned about your fertility. What side effects may I notice from receiving this medication?   Side effects that you should report to your care team as soon as possible: Allergic reactions--skin rash, itching, hives, swelling of the face, lips, tongue, or throat Dry cough, shortness of breath or trouble breathing Infection--fever, chills, cough, sore throat, wounds that don't heal, pain or trouble when passing urine, general feeling of discomfort or being unwell Kidney injury--decrease in the amount of urine, swelling of the ankles, hands, or feet Low red blood cell level--unusual weakness or fatigue, dizziness, headache, trouble breathing Redness, blistering, peeling, or loosening of the skin, including inside the mouth Unusual bruising or bleeding Side effects that usually do not require medical attention (report to your care team  if they continue or are bothersome): Fatigue Loss of appetite Nausea Vomiting This list may not describe all possible side effects. Call your doctor for medical advice about side effects. You may report side effects to FDA at 1-800-FDA-1088. Where should I keep my medication? This medication is given in a hospital or clinic. It will not be stored at home. NOTE: This sheet is a summary. It may not cover all possible information. If you have questions about this medicine, talk to your doctor, pharmacist, or health care provider.  2023 Elsevier/Gold Standard (2021-07-11 00:00:00)        To help prevent nausea and vomiting after your treatment, we encourage you to take your nausea medication as directed.  BELOW ARE SYMPTOMS THAT SHOULD BE REPORTED IMMEDIATELY: *FEVER GREATER THAN 100.4 F (38 C) OR HIGHER *CHILLS OR SWEATING *NAUSEA AND VOMITING THAT IS NOT CONTROLLED WITH YOUR NAUSEA MEDICATION *UNUSUAL SHORTNESS OF BREATH *UNUSUAL BRUISING OR BLEEDING *URINARY PROBLEMS (pain or burning when urinating, or frequent urination) *BOWEL PROBLEMS (unusual diarrhea, constipation, pain near the anus) TENDERNESS IN MOUTH AND THROAT WITH OR WITHOUT PRESENCE OF ULCERS (sore throat, sores in mouth, or a toothache) UNUSUAL RASH, SWELLING OR PAIN  UNUSUAL VAGINAL DISCHARGE OR ITCHING   Items with * indicate a potential emergency and should be followed up as soon as possible or go to the Emergency Department if any problems should occur.  Please show the CHEMOTHERAPY ALERT CARD or IMMUNOTHERAPY ALERT CARD at check-in to the Emergency Department and triage nurse.  Should you have questions after your visit or need to cancel or reschedule your appointment, please contact [facility] [phone]  and follow the prompts.  Office hours are 8:00 a.m. to 4:30 p.m. Monday - Friday. Please note that voicemails left after 4:00 p.m. may not be returned until the following business day.  We  are closed weekends and major holidays. You have access to a nurse at all times for urgent questions. Please call the main number to the clinic [phone] and follow the prompts.  For any non-urgent questions, you may also contact your provider using MyChart. We now offer e-Visits for anyone 18 and older to request care online for non-urgent symptoms. For details visit [email or web address].   Also download the MyChart app! Go to the app store, search "MyChart", open the app, select [facility], and log in with your MyChart username and password.   

## 2022-06-06 LAB — T4: T4, Total: 7.3 ug/dL (ref 4.5–12.0)

## 2022-06-26 ENCOUNTER — Inpatient Hospital Stay: Payer: Medicare HMO

## 2022-06-26 ENCOUNTER — Ambulatory Visit: Payer: Medicare HMO | Admitting: Hematology

## 2022-06-26 ENCOUNTER — Inpatient Hospital Stay: Payer: Medicare HMO | Attending: Neurosurgery

## 2022-06-26 VITALS — BP 115/69 | HR 78 | Temp 97.0°F | Resp 18 | Wt 237.2 lb

## 2022-06-26 DIAGNOSIS — Z5112 Encounter for antineoplastic immunotherapy: Secondary | ICD-10-CM | POA: Diagnosis not present

## 2022-06-26 DIAGNOSIS — C3412 Malignant neoplasm of upper lobe, left bronchus or lung: Secondary | ICD-10-CM | POA: Insufficient documentation

## 2022-06-26 DIAGNOSIS — Z79620 Long term (current) use of immunosuppressive biologic: Secondary | ICD-10-CM | POA: Diagnosis not present

## 2022-06-26 DIAGNOSIS — C3492 Malignant neoplasm of unspecified part of left bronchus or lung: Secondary | ICD-10-CM

## 2022-06-26 DIAGNOSIS — C787 Secondary malignant neoplasm of liver and intrahepatic bile duct: Secondary | ICD-10-CM | POA: Insufficient documentation

## 2022-06-26 DIAGNOSIS — Z5111 Encounter for antineoplastic chemotherapy: Secondary | ICD-10-CM | POA: Diagnosis not present

## 2022-06-26 DIAGNOSIS — Z7901 Long term (current) use of anticoagulants: Secondary | ICD-10-CM | POA: Diagnosis not present

## 2022-06-26 DIAGNOSIS — D329 Benign neoplasm of meninges, unspecified: Secondary | ICD-10-CM | POA: Diagnosis not present

## 2022-06-26 DIAGNOSIS — I2699 Other pulmonary embolism without acute cor pulmonale: Secondary | ICD-10-CM | POA: Insufficient documentation

## 2022-06-26 LAB — COMPREHENSIVE METABOLIC PANEL
ALT: 24 U/L (ref 0–44)
AST: 21 U/L (ref 15–41)
Albumin: 2.9 g/dL — ABNORMAL LOW (ref 3.5–5.0)
Alkaline Phosphatase: 73 U/L (ref 38–126)
Anion gap: 7 (ref 5–15)
BUN: 11 mg/dL (ref 8–23)
CO2: 26 mmol/L (ref 22–32)
Calcium: 8.6 mg/dL — ABNORMAL LOW (ref 8.9–10.3)
Chloride: 106 mmol/L (ref 98–111)
Creatinine, Ser: 0.94 mg/dL (ref 0.44–1.00)
GFR, Estimated: >60 mL/min (ref >60)
Glucose, Bld: 118 mg/dL — ABNORMAL HIGH (ref 70–99)
Potassium: 4 mmol/L (ref 3.5–5.1)
Sodium: 139 mmol/L (ref 135–145)
Total Bilirubin: 0.5 mg/dL (ref 0.3–1.2)
Total Protein: 5.9 g/dL — ABNORMAL LOW (ref 6.5–8.1)

## 2022-06-26 LAB — CBC WITH DIFFERENTIAL/PLATELET
Abs Immature Granulocytes: 0.01 10*3/uL (ref 0.00–0.07)
Basophils Absolute: 0 10*3/uL (ref 0.0–0.1)
Basophils Relative: 0 %
Eosinophils Absolute: 0.1 10*3/uL (ref 0.0–0.5)
Eosinophils Relative: 1 %
HCT: 32.5 % — ABNORMAL LOW (ref 36.0–46.0)
Hemoglobin: 9.9 g/dL — ABNORMAL LOW (ref 12.0–15.0)
Immature Granulocytes: 0 %
Lymphocytes Relative: 7 %
Lymphs Abs: 0.5 10*3/uL — ABNORMAL LOW (ref 0.7–4.0)
MCH: 29.6 pg (ref 26.0–34.0)
MCHC: 30.5 g/dL (ref 30.0–36.0)
MCV: 97 fL (ref 80.0–100.0)
Monocytes Absolute: 0.5 10*3/uL (ref 0.1–1.0)
Monocytes Relative: 7 %
Neutro Abs: 5.8 10*3/uL (ref 1.7–7.7)
Neutrophils Relative %: 85 %
Platelets: 398 10*3/uL (ref 150–400)
RBC: 3.35 MIL/uL — ABNORMAL LOW (ref 3.87–5.11)
RDW: 19.8 % — ABNORMAL HIGH (ref 11.5–15.5)
WBC: 6.8 10*3/uL (ref 4.0–10.5)
nRBC: 0 % (ref 0.0–0.2)

## 2022-06-26 LAB — MAGNESIUM: Magnesium: 2 mg/dL (ref 1.7–2.4)

## 2022-06-26 LAB — TSH: TSH: 0.891 u[IU]/mL (ref 0.350–4.500)

## 2022-06-26 MED ORDER — SODIUM CHLORIDE 0.9% FLUSH: 10.0000 mL | INTRAVENOUS | Status: DC | PRN

## 2022-06-26 MED ORDER — SODIUM CHLORIDE 0.9% FLUSH
10.0000 mL | Freq: Once | INTRAVENOUS | Status: AC
  Administered 2022-06-26: 10 mL via INTRAVENOUS

## 2022-06-26 MED ORDER — SODIUM CHLORIDE 0.9 % IV SOLN: Freq: Once | INTRAVENOUS | Status: AC

## 2022-06-26 MED ORDER — SODIUM CHLORIDE 0.9 % IV SOLN
200.0000 mg | Freq: Once | INTRAVENOUS | Status: AC
  Administered 2022-06-26: 200 mg via INTRAVENOUS
  Filled 2022-06-26: qty 8

## 2022-06-26 MED ORDER — SODIUM CHLORIDE 0.9 % IV SOLN
500.0000 mg/m2 | Freq: Once | INTRAVENOUS | Status: AC
  Administered 2022-06-26: 1100 mg via INTRAVENOUS
  Filled 2022-06-26: qty 40

## 2022-06-26 MED ORDER — HEPARIN SOD (PORK) LOCK FLUSH 100 UNIT/ML IV SOLN: 500.0000 [IU] | Freq: Once | INTRAVENOUS | Status: DC | PRN

## 2022-06-26 MED ORDER — ONDANSETRON HCL 4 MG/2ML IJ SOLN
8.0000 mg | Freq: Once | INTRAMUSCULAR | Status: AC
  Administered 2022-06-26: 8 mg via INTRAVENOUS
  Filled 2022-06-26: qty 4

## 2022-06-26 NOTE — Progress Notes (Signed)
Patient presents today for chemotherapy infusion.  Patient is in satisfactory condition with no new complaints voiced.  Vital signs are stable.  Labs reviewed and all labs are within treatment parameters.  We will proceed with treatment per MD orders.    Patient tolerated treatment well with no complaints voiced.  Patient left via wheelchair in stable condition.  Vital signs stable at discharge.  Follow up as scheduled.    

## 2022-06-26 NOTE — Patient Instructions (Signed)
[facility]  Discharge Instructions: Thank you for choosing [facility] to provide your oncology and hematology care.  If you have a lab appointment with the Cancer Center - please note that after April 8th, 2024, all labs will be drawn in the cancer center.  You do not have to check in or register with the main entrance as you have in the past but will complete your check-in in the cancer center.  Wear comfortable clothing and clothing appropriate for easy access to any Portacath or PICC line.   We strive to give you quality time with your provider. You may need to reschedule your appointment if you arrive late (15 or more minutes).  Arriving late affects you and other patients whose appointments are after yours.  Also, if you miss three or more appointments without notifying the office, you may be dismissed from the clinic at the provider's discretion.      For prescription refill requests, have your pharmacy contact our office and allow 72 hours for refills to be completed.    Today you received the following chemotherapy and/or immunotherapy agents Keytruda/Alimta.  Pembrolizumab Injection What is this medication? PEMBROLIZUMAB (PEM broe LIZ ue mab) treats some types of cancer. It works by helping your immune system slow or stop the spread of cancer cells. It is a monoclonal antibody. This medicine may be used for other purposes; ask your health care provider or pharmacist if you have questions. COMMON BRAND NAME(S): Keytruda What should I tell my care team before I take this medication? They need to know if you have any of these conditions: Allogeneic stem cell transplant (uses someone else's stem cells) Autoimmune diseases, such as Crohn disease, ulcerative colitis, lupus History of chest radiation Nervous system problems, such as Guillain-Barre syndrome, myasthenia gravis Organ transplant An unusual or allergic reaction to pembrolizumab, other  medications, foods, dyes, or preservatives Pregnant or trying to get pregnant Breast-feeding How should I use this medication? This medication is injected into a vein. It is given by your care team in a hospital or clinic setting. A special MedGuide will be given to you before each treatment. Be sure to read this information carefully each time. Talk to your care team about the use of this medication in children. While it may be prescribed for children as young as 6 months for selected conditions, precautions do apply. Overdosage: If you think you have taken too much of this medicine contact a poison control center or emergency room at once. NOTE: This medicine is only for you. Do not share this medicine with others. What if I miss a dose? Keep appointments for follow-up doses. It is important not to miss your dose. Call your care team if you are unable to keep an appointment. What may interact with this medication? Interactions have not been studied. This list may not describe all possible interactions. Give your health care provider a list of all the medicines, herbs, non-prescription drugs, or dietary supplements you use. Also tell them if you smoke, drink alcohol, or use illegal drugs. Some items may interact with your medicine. What should I watch for while using this medication? Your condition will be monitored carefully while you are receiving this medication. You may need blood work while taking this medication. This medication may cause serious skin reactions. They can happen weeks to months after starting the medication. Contact your care team right away if you notice fevers or flu-like symptoms with a rash. The  rash may be red or purple and then turn into blisters or peeling of the skin. You may also notice a red rash with swelling of the face, lips, or lymph nodes in your neck or under your arms. Tell your care team right away if you have any change in your eyesight. Talk to your care  team if you may be pregnant. Serious birth defects can occur if you take this medication during pregnancy and for 4 months after the last dose. You will need a negative pregnancy test before starting this medication. Contraception is recommended while taking this medication and for 4 months after the last dose. Your care team can help you find the option that works for you. Do not breastfeed while taking this medication and for 4 months after the last dose. What side effects may I notice from receiving this medication? Side effects that you should report to your care team as soon as possible: Allergic reactions--skin rash, itching, hives, swelling of the face, lips, tongue, or throat Dry cough, shortness of breath or trouble breathing Eye pain, redness, irritation, or discharge with blurry or decreased vision Heart muscle inflammation--unusual weakness or fatigue, shortness of breath, chest pain, fast or irregular heartbeat, dizziness, swelling of the ankles, feet, or hands Hormone gland problems--headache, sensitivity to light, unusual weakness or fatigue, dizziness, fast or irregular heartbeat, increased sensitivity to cold or heat, excessive sweating, constipation, hair loss, increased thirst or amount of urine, tremors or shaking, irritability Infusion reactions--chest pain, shortness of breath or trouble breathing, feeling faint or lightheaded Kidney injury (glomerulonephritis)--decrease in the amount of urine, red or dark Jabori Henegar urine, foamy or bubbly urine, swelling of the ankles, hands, or feet Liver injury--right upper belly pain, loss of appetite, nausea, light-colored stool, dark yellow or Zuly Belkin urine, yellowing skin or eyes, unusual weakness or fatigue Pain, tingling, or numbness in the hands or feet, muscle weakness, change in vision, confusion or trouble speaking, loss of balance or coordination, trouble walking, seizures Rash, fever, and swollen lymph nodes Redness, blistering, peeling,  or loosening of the skin, including inside the mouth Sudden or severe stomach pain, bloody diarrhea, fever, nausea, vomiting Side effects that usually do not require medical attention (report to your care team if they continue or are bothersome): Bone, joint, or muscle pain Diarrhea Fatigue Loss of appetite Nausea Skin rash This list may not describe all possible side effects. Call your doctor for medical advice about side effects. You may report side effects to FDA at 1-800-FDA-1088. Where should I keep my medication? This medication is given in a hospital or clinic. It will not be stored at home. NOTE: This sheet is a summary. It may not cover all possible information. If you have questions about this medicine, talk to your doctor, pharmacist, or health care provider.  2023 Elsevier/Gold Standard (2021-07-19 00:00:00)    Pemetrexed Injection What is this medication? PEMETREXED (PEM e TREX ed) treats some types of cancer. It works by slowing down the growth of cancer cells. This medicine may be used for other purposes; ask your health care provider or pharmacist if you have questions. COMMON BRAND NAME(S): Alimta, PEMFEXY What should I tell my care team before I take this medication? They need to know if you have any of these conditions: Infection, such as chickenpox, cold sores, or herpes Kidney disease Low blood cell levels (white cells, red cells, and platelets) Lung or breathing disease, such as asthma Radiation therapy An unusual or allergic reaction to pemetrexed, other medications,  foods, dyes, or preservatives If you or your partner are pregnant or trying to get pregnant Breast-feeding How should I use this medication? This medication is injected into a vein. It is given by your care team in a hospital or clinic setting. Talk to your care team about the use of this medication in children. Special care may be needed. Overdosage: If you think you have taken too much of this  medicine contact a poison control center or emergency room at once. NOTE: This medicine is only for you. Do not share this medicine with others. What if I miss a dose? Keep appointments for follow-up doses. It is important not to miss your dose. Call your care team if you are unable to keep an appointment. What may interact with this medication? Do not take this medication with any of the following: Live virus vaccines This medication may also interact with the following: Ibuprofen This list may not describe all possible interactions. Give your health care provider a list of all the medicines, herbs, non-prescription drugs, or dietary supplements you use. Also tell them if you smoke, drink alcohol, or use illegal drugs. Some items may interact with your medicine. What should I watch for while using this medication? Your condition will be monitored carefully while you are receiving this medication. This medication may make you feel generally unwell. This is not uncommon as chemotherapy can affect healthy cells as well as cancer cells. Report any side effects. Continue your course of treatment even though you feel ill unless your care team tells you to stop. This medication can cause serious side effects. To reduce the risk, your care team may give you other medications to take before receiving this one. Be sure to follow the directions from your care team. This medication can cause a rash or redness in areas of the body that have previously had radiation therapy. If you have had radiation therapy, tell your care team if you notice a rash in this area. This medication may increase your risk of getting an infection. Call your care team for advice if you get a fever, chills, sore throat, or other symptoms of a cold or flu. Do not treat yourself. Try to avoid being around people who are sick. Be careful brushing or flossing your teeth or using a toothpick because you may get an infection or bleed more  easily. If you have any dental work done, tell your dentist you are receiving this medication. Avoid taking medications that contain aspirin, acetaminophen, ibuprofen, naproxen, or ketoprofen unless instructed by your care team. These medications may hide a fever. Check with your care team if you have severe diarrhea, nausea, and vomiting, or if you sweat a lot. The loss of too much body fluid may make it dangerous for you to take this medication. Talk to your care team if you or your partner wish to become pregnant or think either of you might be pregnant. This medication can cause serious birth defects if taken during pregnancy and for 6 months after the last dose. A negative pregnancy test is required before starting this medication. A reliable form of contraception is recommended while taking this medication and for 6 months after the last dose. Talk to your care team about reliable forms of contraception. Do not father a child while taking this medication and for 3 months after the last dose. Use a condom while having sex during this time period. Do not breastfeed while taking this medication and for 1 week  after the last dose. This medication may cause infertility. Talk to your care team if you are concerned about your fertility. What side effects may I notice from receiving this medication? Side effects that you should report to your care team as soon as possible: Allergic reactions--skin rash, itching, hives, swelling of the face, lips, tongue, or throat Dry cough, shortness of breath or trouble breathing Infection--fever, chills, cough, sore throat, wounds that don't heal, pain or trouble when passing urine, general feeling of discomfort or being unwell Kidney injury--decrease in the amount of urine, swelling of the ankles, hands, or feet Low red blood cell level--unusual weakness or fatigue, dizziness, headache, trouble breathing Redness, blistering, peeling, or loosening of the skin,  including inside the mouth Unusual bruising or bleeding Side effects that usually do not require medical attention (report to your care team if they continue or are bothersome): Fatigue Loss of appetite Nausea Vomiting This list may not describe all possible side effects. Call your doctor for medical advice about side effects. You may report side effects to FDA at 1-800-FDA-1088. Where should I keep my medication? This medication is given in a hospital or clinic. It will not be stored at home. NOTE: This sheet is a summary. It may not cover all possible information. If you have questions about this medicine, talk to your doctor, pharmacist, or health care provider.  2023 Elsevier/Gold Standard (2021-07-11 00:00:00)        To help prevent nausea and vomiting after your treatment, we encourage you to take your nausea medication as directed.  BELOW ARE SYMPTOMS THAT SHOULD BE REPORTED IMMEDIATELY: *FEVER GREATER THAN 100.4 F (38 C) OR HIGHER *CHILLS OR SWEATING *NAUSEA AND VOMITING THAT IS NOT CONTROLLED WITH YOUR NAUSEA MEDICATION *UNUSUAL SHORTNESS OF BREATH *UNUSUAL BRUISING OR BLEEDING *URINARY PROBLEMS (pain or burning when urinating, or frequent urination) *BOWEL PROBLEMS (unusual diarrhea, constipation, pain near the anus) TENDERNESS IN MOUTH AND THROAT WITH OR WITHOUT PRESENCE OF ULCERS (sore throat, sores in mouth, or a toothache) UNUSUAL RASH, SWELLING OR PAIN  UNUSUAL VAGINAL DISCHARGE OR ITCHING   Items with * indicate a potential emergency and should be followed up as soon as possible or go to the Emergency Department if any problems should occur.  Please show the CHEMOTHERAPY ALERT CARD or IMMUNOTHERAPY ALERT CARD at check-in to the Emergency Department and triage nurse.  Should you have questions after your visit or need to cancel or reschedule your appointment, please contact [facility] [phone]  and follow the prompts.  Office hours are 8:00  a.m. to 4:30 p.m. Monday - Friday. Please note that voicemails left after 4:00 p.m. may not be returned until the following business day.  We are closed weekends and major holidays. You have access to a nurse at all times for urgent questions. Please call the main number to the clinic [phone] and follow the prompts.  For any non-urgent questions, you may also contact your provider using MyChart. We now offer e-Visits for anyone 10 and older to request care online for non-urgent symptoms. For details visit [email or web address].   Also download the MyChart app! Go to the app store, search "MyChart", open the app, select [facility], and log in with your MyChart username and password.

## 2022-06-26 NOTE — Progress Notes (Signed)
Patients port flushed without difficulty.  Good blood return noted with no bruising or swelling noted at site.  Stable during access and blood draw.  Patient to remain accessed for treatment. 

## 2022-07-16 NOTE — Progress Notes (Signed)
[facility] [address], [place] [address]    Clinic Day:  07/17/2022  Referring physician: Mirna Mires, MD  Patient Care Team: Mirna Mires, MD as PCP - General (Family Medicine) Jonelle Sidle, MD as PCP - Cardiology (Cardiology) Doreatha Massed, MD as Medical Oncologist (Medical Oncology)   ASSESSMENT & PLAN:   Assessment: 1.  Adenocarcinoma of left lung (HCC) -Chemoradiation therapy with carboplatin and paclitaxel from 01/07/2019 through 02/11/2019. -Consolidation immunotherapy with durvalumab from 03/19/2019 through 04/15/2018, held due to pneumonitis. -CT chest on 07/02/2019 shows left upper lobe lung mass measuring 2.6 x 2.0 cm.  It shows improvement in size. -MRI of the brain on 07/18/2019 showed left sphenoid wing meningioma measuring 2.6 x 2.0 x 2.6 cm unchanged.  No new enhancing intracranial lesion.  Increased left temporal white matter edema. -Durvalumab restarted on 07/24/2019. -She was evaluated by cardiology with a stress test.  EF was 46%.  She underwent cardiac catheterization which did not show any abnormalities. - 1 year of durvalumab completed on 07/23/2020. - Liver mass biopsy (08/08/2021): Adenocarcinoma, CK7 positive, negative for TTF-1, Napsin a, GATA3, ER, CK20, CDX2 - PET scan (08/25/2021): Right upper lobe lung nodule 9 mm, SUV 5.0.  Groundglass and solid nodule periphery of the right upper lobe 8 mm, SUV 2.45.  Right lobe liver mass 4.5 x 3.6 cm, SUV 7.78. - NGS testing: PD-L1 negative, TMB-low, MSI-stable, K-ras G12 D.  No other targetable mutations. - Carboplatin, pemetrexed and pembrolizumab 4 cycles from 09/22/2021 through 11/25/2021, followed by maintenance pemetrexed and pembrolizumab -  CT CAP (11/10/2021): Reduced size of liver mass and interval resolution of previously seen groundglass pulmonary nodules.  There is a new inflammatory right upper lobe nodule.    Plan: 1.  Metastatic adenocarcinoma of the lung to the liver and  right lung: - She has tolerated last 2 cycles of chemoimmunotherapy very well. - She did not have any skin rashes. - Reviewed labs today: Normal LFTs and creatinine.  CBC grossly normal. - Proceed with next treatment today.  RTC 3 weeks with CT CAP. - Anemia has worsened with hemoglobin dropping to 8.9 from 9.9.  Ferritin is 310 and percent saturation 17.  She takes iron tablet 65 daily she reports dark stools but denies any bleeding per rectum.-She is on Eliquis, recommend checking stool for occult blood along with B12, folic acid, LDH and reticulocyte count..   2.  Meningioma: - MRI of the brain on 04/03/2022: Stable sphenoid wing meningioma on the left with decreasing vasogenic edema.  3.  Unprovoked pulmonary embolism: - Continue Eliquis 5 mg twice daily indefinitely.  No bleeding issues.   4.  Leg swellings: - Continue Lasix 20 mg along with potassium 20 mEq daily.   5.  Dry eyes with itching: - Continue refresh eyedrops which are helping.  Orders Placed This Encounter  Procedures   Vitamin B12   Folate   Lactate dehydrogenase   Reticulocytes   Magnesium    Standing Status:   Future    Standing Expiration Date:   09/18/2023   CBC with Differential    Standing Status:   Future    Standing Expiration Date:   09/18/2023   Comprehensive metabolic panel    Standing Status:   Future    Standing Expiration Date:   09/18/2023      I,Katie Daubenspeck,acting as a scribe for Doreatha Massed, MD.,have documented all relevant documentation on the behalf of Doreatha Massed, MD,as directed by  Doreatha Massed, MD while in the presence of Doreatha Massed, MD.   I, Doreatha Massed MD, have reviewed the above documentation for accuracy and completeness, and I agree with the above.   Doreatha Massed, MD   4/29/202412:50 PM  CHIEF COMPLAINT:   Diagnosis: stage IV adenocarcinoma of the left lung, negative for targetable mutations    Cancer Staging  Adenocarcinoma  of left lung [facility]) Staging form: Lung, AJCC 8th Edition - Clinical stage from 01/02/2019: Stage IIIB (cT3, cN2, cM0) - Signed by Doreatha Massed, MD on 01/02/2019 - Pathologic stage from 09/07/2021: Stage IVA (pTX, pNX, pM1b) - Unsigned    Prior Therapy: 1. Chemoradiation with carboplatin and paclitaxel from 01/07/2019 to 02/11/2019. 2. Consolidation with durvalumab from 03/19/2019 to 04/16/2019, held due to pneumonitis.  Current Therapy:  Pemetrexed and pembrolizumab maintenance    HISTORY OF PRESENT ILLNESS:   Oncology History  Adenocarcinoma of left lung (HCC)  12/09/2018 Initial Diagnosis   Adenocarcinoma of left lung (HCC)   01/02/2019 Cancer Staging   Staging form: Lung, AJCC 8th Edition - Clinical stage from 01/02/2019: Stage IIIB (cT3, cN2, cM0) - Signed by Doreatha Massed, MD on 01/02/2019   01/07/2019 - 02/11/2019 Chemotherapy   The patient had palonosetron (ALOXI) injection 0.25 mg, 0.25 mg, Intravenous,  Once, 6 of 6 cycles Administration: 0.25 mg (01/07/2019), 0.25 mg (01/14/2019), 0.25 mg (01/21/2019), 0.25 mg (01/28/2019), 0.25 mg (02/04/2019), 0.25 mg (02/11/2019) CARBOplatin (PARAPLATIN) 270 mg in sodium chloride 0.9 % 250 mL chemo infusion, 270 mg (100 % of original dose 266.4 mg), Intravenous,  Once, 6 of 6 cycles Dose modification:   (original dose 266.4 mg, Cycle 1),   (original dose 266.4 mg, Cycle 2),   (original dose 266.4 mg, Cycle 3),   (original dose 266.4 mg, Cycle 4) Administration: 270 mg (01/07/2019), 270 mg (01/14/2019), 270 mg (01/21/2019), 270 mg (01/28/2019), 270 mg (02/04/2019), 270 mg (02/11/2019) PACLitaxel (TAXOL) 108 mg in sodium chloride 0.9 % 250 mL chemo infusion (</= 80mg /m2), 45 mg/m2 = 108 mg, Intravenous,  Once, 6 of 6 cycles Administration: 108 mg (01/07/2019), 108 mg (01/14/2019), 108 mg (01/21/2019), 108 mg (01/28/2019), 108 mg (02/04/2019), 108 mg (02/11/2019) fosaprepitant (EMEND) 150 mg, dexamethasone (DECADRON) 12 mg in sodium  chloride 0.9 % 145 mL IVPB, , Intravenous,  Once, 5 of 5 cycles Administration:  (01/14/2019),  (01/21/2019),  (01/28/2019),  (02/04/2019),  (02/11/2019)  for chemotherapy treatment.    03/19/2019 - 07/23/2020 Chemotherapy   Patient is on Treatment Plan : LUNG DURVALUMAB Q14D     09/22/2021 - 11/04/2021 Chemotherapy   Patient is on Treatment Plan : LUNG Carboplatin (5) + Pemetrexed (500) + Pembrolizumab (200) D1 q21d Induction x 4 cycles / Maintenance Pemetrexed (500) + Pembrolizumab (200) D1 q21d     09/22/2021 -  Chemotherapy   Patient is on Treatment Plan : LUNG Carboplatin (5) + Pemetrexed (500) + Pembrolizumab (200) D1 q21d Induction x 4 cycles / Maintenance Pemetrexed (500) + Pembrolizumab (200) D1 q21d        INTERVAL HISTORY:   Denise [name] is a 73 y.o. female presenting to clinic today for follow up of stage IV adenocarcinoma of the left lung. She was last seen by me on 06/05/22.  Of note, she is scheduled for restaging CT C/A/P on 08/07/22.  Today, she states that she is doing well overall. Her appetite level is at 100%. Her energy level is at 60%.  PAST MEDICAL HISTORY:   Past Medical History: Past Medical History:  Diagnosis Date  Anemia    Aortic stenosis    Arthritis    Coronary artery calcification seen on CT scan    Essential hypertension    GERD (gastroesophageal reflux disease)    History of lung cancer    Stage III adenocarcinoma status post chemoradiation   Port-A-Cath in place 01/06/2019   Type 2 diabetes mellitus [facility])     Surgical History: Past Surgical History:  Procedure Laterality Date   CHOLECYSTECTOMY  1997   COLONOSCOPY     GASTRIC BYPASS     INCISIONAL HERNIA REPAIR  04/11/11   IR IMAGING GUIDED PORT INSERTION  12/27/2018   Right   IR US GUIDE BX ASP/DRAIN  08/08/2021   LAPAROSCOPIC SALPINGOOPHERECTOMY     LAPAROTOMY  04/11/2011   Procedure: EXPLORATORY LAPAROTOMY;  Surgeon: Clovis Pu. Cornett, MD;  Location: [facility] ORS;  Service: General;  Laterality: N/A;   closure port hole   RIGHT/LEFT HEART CATH AND CORONARY ANGIOGRAPHY N/A 12/29/2019   Procedure: RIGHT/LEFT HEART CATH AND CORONARY ANGIOGRAPHY;  Surgeon: Lennette Bihari, MD;  Location: [facility] INVASIVE CV LAB;  Service: Cardiovascular;  Laterality: N/A;   TOTAL HIP ARTHROPLASTY  03/07/2012   Procedure: TOTAL HIP ARTHROPLASTY ANTERIOR APPROACH;  Surgeon: Shelda Pal, MD;  Location: [facility] ORS;  Service: Orthopedics;  Laterality: Right;   TOTAL SHOULDER ARTHROPLASTY Left 01/21/2015   TOTAL SHOULDER ARTHROPLASTY Left 01/21/2015   Procedure: LEFT TOTAL SHOULDER ARTHROPLASTY;  Surgeon: Francena Hanly, MD;  Location: [facility] OR;  Service: Orthopedics;  Laterality: Left;   VAGINAL HYSTERECTOMY      Social History: Social History   Socioeconomic History   Marital status: Single    Spouse name: Not on file   Number of children: Not on file   Years of education: 12th grade   Highest education level: Not on file  Occupational History   Occupation: Employed    Employer: [employer]  Tobacco Use   Smoking status: Former    Packs/day: 1.00    Years: 12.00    Additional pack years: 0.00    Total pack years: 12.00    Types: Cigarettes    Quit date: 03/20/1976    Years since quitting: 46.3   Smokeless tobacco: Never  Vaping Use   Vaping Use: Never used  Substance and Sexual Activity   Alcohol use: No   Drug use: No   Sexual activity: Not Currently  Other Topics Concern   Not on file  Social History Narrative   Not on file   Social Determinants of Health   Financial Resource Strain: Low Risk  (12/06/2018)   Overall Financial Resource Strain (CARDIA)    Difficulty of Paying Living Expenses: Not very hard  Food Insecurity: No Food Insecurity (12/06/2018)   Hunger Vital Sign    Worried About Running Out of Food in the Last Year: Never true    Ran Out of Food in the Last Year: Never true  Transportation Needs: No Transportation Needs (12/06/2018)   PRAPARE - Scientist, research (physical sciences) (Medical): No    Lack of Transportation (Non-Medical): No  Physical Activity: Inactive (12/06/2018)   Exercise Vital Sign    Days of Exercise per Week: 0 days    Minutes of Exercise per Session: 0 min  Stress: No Stress Concern Present (12/06/2018)   [employer] of Occupational Health - Occupational Stress Questionnaire    Feeling of Stress : Only a little  Social Connections: Moderately Isolated (12/06/2018)   Social Connection and  Isolation Panel [NHANES]    Frequency of Communication with Friends and Family: More than three times a week    Frequency of Social Gatherings with Friends and Family: Once a week    Attends Religious Services: More than 4 times per year    Active Member of [employer] or Organizations: No    Attends Banker Meetings: Never    Marital Status: Never married  Intimate Partner Violence: Not At Risk (12/06/2018)   Humiliation, Afraid, Rape, and Kick questionnaire    Fear of Current or Ex-Partner: No    Emotionally Abused: No    Physically Abused: No    Sexually Abused: No    Family History: Family History  Problem Relation Age of Onset   Breast cancer Mother    COPD Mother    Arthritis Mother    Diabetes Mother    Hypertension Mother    Hypertension Father    Diabetes Father    Breast cancer Sister    Thyroid cancer Brother    Huntington's disease Maternal Grandmother    Heart attack Brother     Current Medications:  Current Outpatient Medications:    augmented betamethasone dipropionate (DIPROLENE-AF) 0.05 % cream, Apply 1 Application topically 2 (two) times daily., Disp: , Rfl:    Calcium Carbonate Antacid (TUMS PO), Take 4 tablets by mouth daily., Disp: , Rfl:    carvedilol (COREG) 3.125 MG tablet, TAKE 1 TABLET BY MOUTH 2 TIMES DAILY., Disp: 180 tablet, Rfl: 1   Cyanocobalamin (VITAMIN B-12) 2500 MCG SUBL, Place 2,500 mcg under the tongue every morning. , Disp: , Rfl:    diphenhydrAMINE (BENADRYL) 25 MG tablet, Take  25 mg by mouth every 6 (six) hours as needed for itching or allergies., Disp: , Rfl:    ELIQUIS 5 MG TABS tablet, TAKE 1 TABLET BY MOUTH TWICE A DAY, Disp: 60 tablet, Rfl: 6   ferrous sulfate 325 (65 FE) MG tablet, Take 325 mg by mouth every evening., Disp: , Rfl:    fluticasone (FLONASE) 50 MCG/ACT nasal spray, Place 1 spray into both nostrils 2 (two) times daily., Disp: 16 g, Rfl: 2   folic acid (FOLVITE) 1 MG tablet, TAKE 1 TABLET BY MOUTH EVERY DAY, Disp: 90 tablet, Rfl: 1   furosemide (LASIX) 20 MG tablet, Take 20 mg by mouth 2 (two) times daily., Disp: , Rfl:    gabapentin (NEURONTIN) 300 MG capsule, Take 300 mg by mouth 2 (two) times daily., Disp: , Rfl:    Multiple Vitamin (MULITIVITAMIN WITH MINERALS) TABS, Take 1 tablet by mouth daily., Disp: , Rfl:    omeprazole (PRILOSEC) 20 MG capsule, Take 20 mg by mouth daily., Disp: , Rfl:    Potassium Chloride ER 20 MEQ TBCR, Take 1 tablet by mouth 2 (two) times daily., Disp: , Rfl:  No current facility-administered medications for this visit.  Facility-Administered Medications Ordered in Other Visits:    heparin lock flush 100 unit/mL, 500 Units, Intracatheter, Once PRN, Doreatha Massed, MD   magnesium sulfate 2 GM/50ML IVPB, , , ,    pembrolizumab (KEYTRUDA) 200 mg in sodium chloride 0.9 % 50 mL chemo infusion, 200 mg, Intravenous, Once, Doreatha Massed, MD   PEMEtrexed (ALIMTA) 1,100 mg in sodium chloride 0.9 % 100 mL chemo infusion, 500 mg/m2 (Treatment Plan Recorded), Intravenous, Once, Doreatha Massed, MD   sodium chloride flush (NS) 0.9 % injection 10 mL, 10 mL, Intracatheter, PRN, Doreatha Massed, MD   Allergies: No Known Allergies  REVIEW OF SYSTEMS:  Review of Systems  Constitutional:  Negative for chills, fatigue and fever.  HENT:   Negative for lump/mass, mouth sores, nosebleeds, sore throat and trouble swallowing.   Eyes:  Negative for eye problems.  Respiratory:  Positive for shortness of breath.  Negative for cough.   Cardiovascular:  Positive for leg swelling. Negative for chest pain and palpitations.  Gastrointestinal:  Negative for abdominal pain, constipation, diarrhea, nausea and vomiting.  Genitourinary:  Negative for bladder incontinence, difficulty urinating, dysuria, frequency, hematuria and nocturia.   Musculoskeletal:  Negative for arthralgias, back pain, flank pain, myalgias and neck pain.  Skin:  Negative for itching and rash.  Neurological:  Negative for dizziness, headaches and numbness.  Hematological:  Does not bruise/bleed easily.  Psychiatric/Behavioral:  Negative for depression, sleep disturbance and suicidal ideas. The patient is not nervous/anxious.   All other systems reviewed and are negative.    VITALS:   There were no vitals taken for this visit.  Wt Readings from Last 3 Encounters:  07/17/22 238 lb 9.6 oz (108.2 kg)  06/26/22 237 lb 3.4 oz (107.6 kg)  06/05/22 239 lb 1.6 oz (108.5 kg)    There is no height or weight on file to calculate BMI.  Performance status (ECOG): 1 - Symptomatic but completely ambulatory  PHYSICAL EXAM:   Physical Exam Vitals and nursing note reviewed. Exam conducted with a chaperone present.  Constitutional:      Appearance: Normal appearance.  Cardiovascular:     Rate and Rhythm: Normal rate and regular rhythm.     Pulses: Normal pulses.     Heart sounds: Normal heart sounds.  Pulmonary:     Effort: Pulmonary effort is normal.     Breath sounds: Normal breath sounds.  Abdominal:     Palpations: Abdomen is soft. There is no hepatomegaly, splenomegaly or mass.     Tenderness: There is no abdominal tenderness.  Musculoskeletal:     Right lower leg: No edema.     Left lower leg: No edema.  Lymphadenopathy:     Cervical: No cervical adenopathy.     Right cervical: No superficial, deep or posterior cervical adenopathy.    Left cervical: No superficial, deep or posterior cervical adenopathy.     Upper Body:     Right  upper body: No supraclavicular or axillary adenopathy.     Left upper body: No supraclavicular or axillary adenopathy.  Neurological:     General: No focal deficit present.     Mental Status: She is alert and oriented to person, place, and time.  Psychiatric:        Mood and Affect: Mood normal.        Behavior: Behavior normal.     LABS:      Latest Ref Rng & Units 07/17/2022   10:53 AM 06/26/2022   12:00 PM 06/05/2022    9:16 AM  CBC  WBC 4.0 - 10.5 K/uL 4.7  6.8  4.8   Hemoglobin 12.0 - 15.0 g/dL 8.9  9.9  9.4   Hematocrit 36.0 - 46.0 % 30.0  32.5  31.7   Platelets 150 - 400 K/uL 433  398  408       Latest Ref Rng & Units 07/17/2022   10:53 AM 06/26/2022   12:00 PM 06/05/2022    9:16 AM  CMP  Glucose 70 - 99 mg/dL 960  454  098   BUN 8 - 23 mg/dL 6  11  12    Creatinine 0.44 - 1.00  mg/dL 1.61  0.96  0.45   Sodium 135 - 145 mmol/L 139  139  142   Potassium 3.5 - 5.1 mmol/L 3.7  4.0  3.9   Chloride 98 - 111 mmol/L 104  106  106   CO2 22 - 32 mmol/L 26  26  27    Calcium 8.9 - 10.3 mg/dL 8.5  8.6  8.5   Total Protein 6.5 - 8.1 g/dL 6.3  5.9  6.3   Total Bilirubin 0.3 - 1.2 mg/dL 0.5  0.5  0.4   Alkaline Phos 38 - 126 U/L 80  73  87   AST 15 - 41 U/L 17  21  22    ALT 0 - 44 U/L 14  24  15       No results found for: "CEA1", "CEA" / No results found for: "CEA1", "CEA" No results found for: "PSA1" No results found for: "WUJ811" No results found for: "CAN125"  No results found for: "TOTALPROTELP", "ALBUMINELP", "A1GS", "A2GS", "BETS", "BETA2SER", "GAMS", "MSPIKE", "SPEI" Lab Results  Component Value Date   TIBC 227 (L) 06/05/2022   FERRITIN 310 (H) 06/05/2022   IRONPCTSAT 17 06/05/2022   No results found for: "LDH"   STUDIES:   No results found.

## 2022-07-17 ENCOUNTER — Encounter: Payer: Self-pay | Admitting: Hematology

## 2022-07-17 ENCOUNTER — Inpatient Hospital Stay: Payer: Medicare HMO

## 2022-07-17 ENCOUNTER — Ambulatory Visit: Payer: Medicare HMO | Admitting: Hematology

## 2022-07-17 ENCOUNTER — Ambulatory Visit: Payer: Medicare HMO

## 2022-07-17 ENCOUNTER — Other Ambulatory Visit: Payer: Medicare HMO

## 2022-07-17 ENCOUNTER — Inpatient Hospital Stay (HOSPITAL_BASED_OUTPATIENT_CLINIC_OR_DEPARTMENT_OTHER): Payer: Medicare HMO | Admitting: Hematology

## 2022-07-17 VITALS — BP 116/57 | HR 84 | Temp 96.6°F | Resp 20 | Wt 238.6 lb

## 2022-07-17 VITALS — BP 115/65 | HR 74 | Temp 97.9°F | Resp 18

## 2022-07-17 DIAGNOSIS — C3490 Malignant neoplasm of unspecified part of unspecified bronchus or lung: Secondary | ICD-10-CM

## 2022-07-17 DIAGNOSIS — Z95828 Presence of other vascular implants and grafts: Secondary | ICD-10-CM

## 2022-07-17 DIAGNOSIS — D508 Other iron deficiency anemias: Secondary | ICD-10-CM | POA: Diagnosis not present

## 2022-07-17 DIAGNOSIS — Z79620 Long term (current) use of immunosuppressive biologic: Secondary | ICD-10-CM | POA: Diagnosis not present

## 2022-07-17 DIAGNOSIS — C3412 Malignant neoplasm of upper lobe, left bronchus or lung: Secondary | ICD-10-CM | POA: Diagnosis not present

## 2022-07-17 DIAGNOSIS — C3492 Malignant neoplasm of unspecified part of left bronchus or lung: Secondary | ICD-10-CM

## 2022-07-17 DIAGNOSIS — Z7901 Long term (current) use of anticoagulants: Secondary | ICD-10-CM | POA: Diagnosis not present

## 2022-07-17 DIAGNOSIS — D329 Benign neoplasm of meninges, unspecified: Secondary | ICD-10-CM | POA: Diagnosis not present

## 2022-07-17 DIAGNOSIS — Z5112 Encounter for antineoplastic immunotherapy: Secondary | ICD-10-CM | POA: Diagnosis not present

## 2022-07-17 DIAGNOSIS — C787 Secondary malignant neoplasm of liver and intrahepatic bile duct: Secondary | ICD-10-CM | POA: Diagnosis not present

## 2022-07-17 DIAGNOSIS — Z5111 Encounter for antineoplastic chemotherapy: Secondary | ICD-10-CM | POA: Diagnosis not present

## 2022-07-17 DIAGNOSIS — I2699 Other pulmonary embolism without acute cor pulmonale: Secondary | ICD-10-CM | POA: Diagnosis not present

## 2022-07-17 LAB — TSH: TSH: 1.963 u[IU]/mL (ref 0.350–4.500)

## 2022-07-17 LAB — CBC WITH DIFFERENTIAL/PLATELET
Abs Immature Granulocytes: 0 10*3/uL (ref 0.00–0.07)
Basophils Absolute: 0 10*3/uL (ref 0.0–0.1)
Basophils Relative: 1 %
Eosinophils Absolute: 0.1 10*3/uL (ref 0.0–0.5)
Eosinophils Relative: 2 %
HCT: 30 % — ABNORMAL LOW (ref 36.0–46.0)
Hemoglobin: 8.9 g/dL — ABNORMAL LOW (ref 12.0–15.0)
Immature Granulocytes: 0 %
Lymphocytes Relative: 15 %
Lymphs Abs: 0.7 10*3/uL (ref 0.7–4.0)
MCH: 28.6 pg (ref 26.0–34.0)
MCHC: 29.7 g/dL — ABNORMAL LOW (ref 30.0–36.0)
MCV: 96.5 fL (ref 80.0–100.0)
Monocytes Absolute: 0.6 10*3/uL (ref 0.1–1.0)
Monocytes Relative: 14 %
Neutro Abs: 3.3 10*3/uL (ref 1.7–7.7)
Neutrophils Relative %: 68 %
Platelets: 433 10*3/uL — ABNORMAL HIGH (ref 150–400)
RBC: 3.11 MIL/uL — ABNORMAL LOW (ref 3.87–5.11)
RDW: 17.2 % — ABNORMAL HIGH (ref 11.5–15.5)
WBC: 4.7 10*3/uL (ref 4.0–10.5)
nRBC: 0 % (ref 0.0–0.2)

## 2022-07-17 LAB — RETICULOCYTES
Immature Retic Fract: 26.8 % — ABNORMAL HIGH (ref 2.3–15.9)
RBC.: 3.02 MIL/uL — ABNORMAL LOW (ref 3.87–5.11)
Retic Count, Absolute: 78.8 10*3/uL (ref 19.0–186.0)
Retic Ct Pct: 2.6 % (ref 0.4–3.1)

## 2022-07-17 LAB — COMPREHENSIVE METABOLIC PANEL
ALT: 14 U/L (ref 0–44)
AST: 17 U/L (ref 15–41)
Albumin: 2.6 g/dL — ABNORMAL LOW (ref 3.5–5.0)
Alkaline Phosphatase: 80 U/L (ref 38–126)
Anion gap: 9 (ref 5–15)
BUN: 6 mg/dL — ABNORMAL LOW (ref 8–23)
CO2: 26 mmol/L (ref 22–32)
Calcium: 8.5 mg/dL — ABNORMAL LOW (ref 8.9–10.3)
Chloride: 104 mmol/L (ref 98–111)
Creatinine, Ser: 0.86 mg/dL (ref 0.44–1.00)
GFR, Estimated: >60 mL/min (ref >60)
Glucose, Bld: 104 mg/dL — ABNORMAL HIGH (ref 70–99)
Potassium: 3.7 mmol/L (ref 3.5–5.1)
Sodium: 139 mmol/L (ref 135–145)
Total Bilirubin: 0.5 mg/dL (ref 0.3–1.2)
Total Protein: 6.3 g/dL — ABNORMAL LOW (ref 6.5–8.1)

## 2022-07-17 LAB — MAGNESIUM: Magnesium: 1.9 mg/dL (ref 1.7–2.4)

## 2022-07-17 LAB — LACTATE DEHYDROGENASE: LDH: 237 U/L — ABNORMAL HIGH (ref 98–192)

## 2022-07-17 LAB — VITAMIN B12: Vitamin B-12: 2232 pg/mL — ABNORMAL HIGH (ref 180–914)

## 2022-07-17 MED ORDER — HEPARIN SOD (PORK) LOCK FLUSH 100 UNIT/ML IV SOLN
500.0000 [IU] | Freq: Once | INTRAVENOUS | Status: AC | PRN
  Administered 2022-07-17: 500 [IU]

## 2022-07-17 MED ORDER — SODIUM CHLORIDE 0.9 % IV SOLN: Freq: Once | INTRAVENOUS | Status: AC

## 2022-07-17 MED ORDER — SODIUM CHLORIDE 0.9 % IV SOLN
500.0000 mg/m2 | Freq: Once | INTRAVENOUS | Status: AC
  Administered 2022-07-17: 1100 mg via INTRAVENOUS
  Filled 2022-07-17: qty 40

## 2022-07-17 MED ORDER — SODIUM CHLORIDE 0.9% FLUSH
10.0000 mL | Freq: Once | INTRAVENOUS | Status: AC
  Administered 2022-07-17: 10 mL via INTRAVENOUS

## 2022-07-17 MED ORDER — SODIUM CHLORIDE 0.9% FLUSH
10.0000 mL | INTRAVENOUS | Status: DC | PRN
  Administered 2022-07-17: 10 mL

## 2022-07-17 MED ORDER — ONDANSETRON HCL 4 MG/2ML IJ SOLN
8.0000 mg | Freq: Once | INTRAMUSCULAR | Status: AC
  Administered 2022-07-17: 8 mg via INTRAVENOUS
  Filled 2022-07-17: qty 4

## 2022-07-17 MED ORDER — SODIUM CHLORIDE 0.9 % IV SOLN
200.0000 mg | Freq: Once | INTRAVENOUS | Status: AC
  Administered 2022-07-17: 200 mg via INTRAVENOUS
  Filled 2022-07-17: qty 8

## 2022-07-17 NOTE — Patient Instructions (Signed)
[facility]  Discharge Instructions: Thank you for choosing [facility] to provide your oncology and hematology care.  If you have a lab appointment with the Cancer Center - please note that after April 8th, 2024, all labs will be drawn in the cancer center.  You do not have to check in or register with the main entrance as you have in the past but will complete your check-in in the cancer center.  Wear comfortable clothing and clothing appropriate for easy access to any Portacath or PICC line.   We strive to give you quality time with your provider. You may need to reschedule your appointment if you arrive late (15 or more minutes).  Arriving late affects you and other patients whose appointments are after yours.  Also, if you miss three or more appointments without notifying the office, you may be dismissed from the clinic at the provider's discretion.      For prescription refill requests, have your pharmacy contact our office and allow 72 hours for refills to be completed.    Today you received the following chemotherapy and/or immunotherapy agents Keytruda/Alimta.  Pembrolizumab Injection What is this medication? PEMBROLIZUMAB (PEM broe LIZ ue mab) treats some types of cancer. It works by helping your immune system slow or stop the spread of cancer cells. It is a monoclonal antibody. This medicine may be used for other purposes; ask your health care provider or pharmacist if you have questions. COMMON BRAND NAME(S): Keytruda What should I tell my care team before I take this medication? They need to know if you have any of these conditions: Allogeneic stem cell transplant (uses someone else's stem cells) Autoimmune diseases, such as Crohn disease, ulcerative colitis, lupus History of chest radiation Nervous system problems, such as Guillain-Barre syndrome, myasthenia gravis Organ transplant An unusual or allergic reaction to pembrolizumab, other  medications, foods, dyes, or preservatives Pregnant or trying to get pregnant Breast-feeding How should I use this medication? This medication is injected into a vein. It is given by your care team in a hospital or clinic setting. A special MedGuide will be given to you before each treatment. Be sure to read this information carefully each time. Talk to your care team about the use of this medication in children. While it may be prescribed for children as young as 6 months for selected conditions, precautions do apply. Overdosage: If you think you have taken too much of this medicine contact a poison control center or emergency room at once. NOTE: This medicine is only for you. Do not share this medicine with others. What if I miss a dose? Keep appointments for follow-up doses. It is important not to miss your dose. Call your care team if you are unable to keep an appointment. What may interact with this medication? Interactions have not been studied. This list may not describe all possible interactions. Give your health care provider a list of all the medicines, herbs, non-prescription drugs, or dietary supplements you use. Also tell them if you smoke, drink alcohol, or use illegal drugs. Some items may interact with your medicine. What should I watch for while using this medication? Your condition will be monitored carefully while you are receiving this medication. You may need blood work while taking this medication. This medication may cause serious skin reactions. They can happen weeks to months after starting the medication. Contact your care team right away if you notice fevers or flu-like symptoms with a rash. The   rash may be red or purple and then turn into blisters or peeling of the skin. You may also notice a red rash with swelling of the face, lips, or lymph nodes in your neck or under your arms. Tell your care team right away if you have any change in your eyesight. Talk to your care  team if you may be pregnant. Serious birth defects can occur if you take this medication during pregnancy and for 4 months after the last dose. You will need a negative pregnancy test before starting this medication. Contraception is recommended while taking this medication and for 4 months after the last dose. Your care team can help you find the option that works for you. Do not breastfeed while taking this medication and for 4 months after the last dose. What side effects may I notice from receiving this medication? Side effects that you should report to your care team as soon as possible: Allergic reactions--skin rash, itching, hives, swelling of the face, lips, tongue, or throat Dry cough, shortness of breath or trouble breathing Eye pain, redness, irritation, or discharge with blurry or decreased vision Heart muscle inflammation--unusual weakness or fatigue, shortness of breath, chest pain, fast or irregular heartbeat, dizziness, swelling of the ankles, feet, or hands Hormone gland problems--headache, sensitivity to light, unusual weakness or fatigue, dizziness, fast or irregular heartbeat, increased sensitivity to cold or heat, excessive sweating, constipation, hair loss, increased thirst or amount of urine, tremors or shaking, irritability Infusion reactions--chest pain, shortness of breath or trouble breathing, feeling faint or lightheaded Kidney injury (glomerulonephritis)--decrease in the amount of urine, red or dark brown urine, foamy or bubbly urine, swelling of the ankles, hands, or feet Liver injury--right upper belly pain, loss of appetite, nausea, light-colored stool, dark yellow or brown urine, yellowing skin or eyes, unusual weakness or fatigue Pain, tingling, or numbness in the hands or feet, muscle weakness, change in vision, confusion or trouble speaking, loss of balance or coordination, trouble walking, seizures Rash, fever, and swollen lymph nodes Redness, blistering, peeling,  or loosening of the skin, including inside the mouth Sudden or severe stomach pain, bloody diarrhea, fever, nausea, vomiting Side effects that usually do not require medical attention (report to your care team if they continue or are bothersome): Bone, joint, or muscle pain Diarrhea Fatigue Loss of appetite Nausea Skin rash This list may not describe all possible side effects. Call your doctor for medical advice about side effects. You may report side effects to FDA at 1-800-FDA-1088. Where should I keep my medication? This medication is given in a hospital or clinic. It will not be stored at home. NOTE: This sheet is a summary. It may not cover all possible information. If you have questions about this medicine, talk to your doctor, pharmacist, or health care provider.  2023 Elsevier/Gold Standard (2021-07-19 00:00:00)    Pemetrexed Injection What is this medication? PEMETREXED (PEM e TREX ed) treats some types of cancer. It works by slowing down the growth of cancer cells. This medicine may be used for other purposes; ask your health care provider or pharmacist if you have questions. COMMON BRAND NAME(S): Alimta, PEMFEXY What should I tell my care team before I take this medication? They need to know if you have any of these conditions: Infection, such as chickenpox, cold sores, or herpes Kidney disease Low blood cell levels (white cells, red cells, and platelets) Lung or breathing disease, such as asthma Radiation therapy An unusual or allergic reaction to pemetrexed, other medications,   foods, dyes, or preservatives If you or your partner are pregnant or trying to get pregnant Breast-feeding How should I use this medication? This medication is injected into a vein. It is given by your care team in a hospital or clinic setting. Talk to your care team about the use of this medication in children. Special care may be needed. Overdosage: If you think you have taken too much of this  medicine contact a poison control center or emergency room at once. NOTE: This medicine is only for you. Do not share this medicine with others. What if I miss a dose? Keep appointments for follow-up doses. It is important not to miss your dose. Call your care team if you are unable to keep an appointment. What may interact with this medication? Do not take this medication with any of the following: Live virus vaccines This medication may also interact with the following: Ibuprofen This list may not describe all possible interactions. Give your health care provider a list of all the medicines, herbs, non-prescription drugs, or dietary supplements you use. Also tell them if you smoke, drink alcohol, or use illegal drugs. Some items may interact with your medicine. What should I watch for while using this medication? Your condition will be monitored carefully while you are receiving this medication. This medication may make you feel generally unwell. This is not uncommon as chemotherapy can affect healthy cells as well as cancer cells. Report any side effects. Continue your course of treatment even though you feel ill unless your care team tells you to stop. This medication can cause serious side effects. To reduce the risk, your care team may give you other medications to take before receiving this one. Be sure to follow the directions from your care team. This medication can cause a rash or redness in areas of the body that have previously had radiation therapy. If you have had radiation therapy, tell your care team if you notice a rash in this area. This medication may increase your risk of getting an infection. Call your care team for advice if you get a fever, chills, sore throat, or other symptoms of a cold or flu. Do not treat yourself. Try to avoid being around people who are sick. Be careful brushing or flossing your teeth or using a toothpick because you may get an infection or bleed more  easily. If you have any dental work done, tell your dentist you are receiving this medication. Avoid taking medications that contain aspirin, acetaminophen, ibuprofen, naproxen, or ketoprofen unless instructed by your care team. These medications may hide a fever. Check with your care team if you have severe diarrhea, nausea, and vomiting, or if you sweat a lot. The loss of too much body fluid may make it dangerous for you to take this medication. Talk to your care team if you or your partner wish to become pregnant or think either of you might be pregnant. This medication can cause serious birth defects if taken during pregnancy and for 6 months after the last dose. A negative pregnancy test is required before starting this medication. A reliable form of contraception is recommended while taking this medication and for 6 months after the last dose. Talk to your care team about reliable forms of contraception. Do not father a child while taking this medication and for 3 months after the last dose. Use a condom while having sex during this time period. Do not breastfeed while taking this medication and for 1 week   after the last dose. This medication may cause infertility. Talk to your care team if you are concerned about your fertility. What side effects may I notice from receiving this medication? Side effects that you should report to your care team as soon as possible: Allergic reactions--skin rash, itching, hives, swelling of the face, lips, tongue, or throat Dry cough, shortness of breath or trouble breathing Infection--fever, chills, cough, sore throat, wounds that don't heal, pain or trouble when passing urine, general feeling of discomfort or being unwell Kidney injury--decrease in the amount of urine, swelling of the ankles, hands, or feet Low red blood cell level--unusual weakness or fatigue, dizziness, headache, trouble breathing Redness, blistering, peeling, or loosening of the skin,  including inside the mouth Unusual bruising or bleeding Side effects that usually do not require medical attention (report to your care team if they continue or are bothersome): Fatigue Loss of appetite Nausea Vomiting This list may not describe all possible side effects. Call your doctor for medical advice about side effects. You may report side effects to FDA at 1-800-FDA-1088. Where should I keep my medication? This medication is given in a hospital or clinic. It will not be stored at home. NOTE: This sheet is a summary. It may not cover all possible information. If you have questions about this medicine, talk to your doctor, pharmacist, or health care provider.  2023 Elsevier/Gold Standard (2021-07-11 00:00:00)        To help prevent nausea and vomiting after your treatment, we encourage you to take your nausea medication as directed.  BELOW ARE SYMPTOMS THAT SHOULD BE REPORTED IMMEDIATELY: *FEVER GREATER THAN 100.4 F (38 C) OR HIGHER *CHILLS OR SWEATING *NAUSEA AND VOMITING THAT IS NOT CONTROLLED WITH YOUR NAUSEA MEDICATION *UNUSUAL SHORTNESS OF BREATH *UNUSUAL BRUISING OR BLEEDING *URINARY PROBLEMS (pain or burning when urinating, or frequent urination) *BOWEL PROBLEMS (unusual diarrhea, constipation, pain near the anus) TENDERNESS IN MOUTH AND THROAT WITH OR WITHOUT PRESENCE OF ULCERS (sore throat, sores in mouth, or a toothache) UNUSUAL RASH, SWELLING OR PAIN  UNUSUAL VAGINAL DISCHARGE OR ITCHING   Items with * indicate a potential emergency and should be followed up as soon as possible or go to the Emergency Department if any problems should occur.  Please show the CHEMOTHERAPY ALERT CARD or IMMUNOTHERAPY ALERT CARD at check-in to the Emergency Department and triage nurse.  Should you have questions after your visit or need to cancel or reschedule your appointment, please contact [facility] [phone]  and follow the prompts.  Office hours are 8:00  a.m. to 4:30 p.m. Monday - Friday. Please note that voicemails left after 4:00 p.m. may not be returned until the following business day.  We are closed weekends and major holidays. You have access to a nurse at all times for urgent questions. Please call the main number to the clinic [phone] and follow the prompts.  For any non-urgent questions, you may also contact your provider using MyChart. We now offer e-Visits for anyone 18 and older to request care online for non-urgent symptoms. For details visit [email or web address].   Also download the MyChart app! Go to the app store, search "MyChart", open the app, select [facility], and log in with your MyChart username and password.   

## 2022-07-17 NOTE — Patient Instructions (Signed)
[facility] at [facility] Discharge Instructions   You were seen and examined today by Dr. Katragadda.  He reviewed the results of your lab work which are normal/stable.   We will proceed with your treatment today.  Return as scheduled.    Thank you for choosing [facility] to provide your oncology and hematology care.  To afford each patient quality time with our provider, please arrive at least 15 minutes before your scheduled appointment time.   If you have a lab appointment with the Cancer Center please come in thru the Main Entrance and check in at the main information desk.  You need to re-schedule your appointment should you arrive 10 or more minutes late.  We strive to give you quality time with our providers, and arriving late affects you and other patients whose appointments are after yours.  Also, if you no show three or more times for appointments you may be dismissed from the clinic at the providers discretion.     Again, thank you for choosing [facility].  Our hope is that these requests will decrease the amount of time that you wait before being seen by our physicians.       _____________________________________________________________  Should you have questions after your visit to [facility], please contact our office at [phone] and follow the prompts.  Our office hours are 8:00 a.m. and 4:30 p.m. Monday - Friday.  Please note that voicemails left after 4:00 p.m. may not be returned until the following business day.  We are closed weekends and major holidays.  You do have access to a nurse 24-7, just call the main number to the clinic [phone] and do not press any options, hold on the line and a nurse will answer the phone.    For prescription refill requests, have your pharmacy contact our office and allow 72 hours.    Due to Covid, you will need to wear a mask upon entering  the hospital. If you do not have a mask, a mask will be given to you at the Main Entrance upon arrival. For doctor visits, patients may have 1 support person age 18 or older with them. For treatment visits, patients can not have anyone with them due to social distancing guidelines and our immunocompromised population.      

## 2022-07-18 LAB — FOLATE: Folate: >40 ng/mL (ref >5.9)

## 2022-07-19 ENCOUNTER — Ambulatory Visit (HOSPITAL_COMMUNITY)
Admission: RE | Admit: 2022-07-19 | Discharge: 2022-07-19 | Disposition: A | Discharge status: Home / Self Care | Payer: Medicare HMO | Source: Ambulatory Visit | Attending: Cardiology | Admitting: Cardiology

## 2022-07-19 DIAGNOSIS — I350 Nonrheumatic aortic (valve) stenosis: Secondary | ICD-10-CM | POA: Diagnosis not present

## 2022-07-19 LAB — ECHOCARDIOGRAM COMPLETE
AR max vel: 0.81 cm2
AV Area VTI: 0.92 cm2
AV Area mean vel: 0.89 cm2
AV Mean grad: 42 mmHg
AV Peak grad: 68.3 mmHg
Ao pk vel: 4.13 m/s
Area-P 1/2: 1.96 cm2
P 1/2 time: 467 msec
S' Lateral: 2.5 cm

## 2022-07-19 NOTE — Progress Notes (Signed)
*  PRELIMINARY RESULTS* Echocardiogram 2D Echocardiogram has been performed.  Stacey Drain 07/19/2022, 1:54 PM

## 2022-07-20 ENCOUNTER — Other Ambulatory Visit: Payer: Self-pay | Admitting: *Deleted

## 2022-07-20 DIAGNOSIS — I2699 Other pulmonary embolism without acute cor pulmonale: Secondary | ICD-10-CM | POA: Insufficient documentation

## 2022-07-20 DIAGNOSIS — C3412 Malignant neoplasm of upper lobe, left bronchus or lung: Secondary | ICD-10-CM | POA: Insufficient documentation

## 2022-07-20 DIAGNOSIS — Z7901 Long term (current) use of anticoagulants: Secondary | ICD-10-CM | POA: Insufficient documentation

## 2022-07-20 DIAGNOSIS — D508 Other iron deficiency anemias: Secondary | ICD-10-CM

## 2022-07-20 DIAGNOSIS — Z5111 Encounter for antineoplastic chemotherapy: Secondary | ICD-10-CM | POA: Insufficient documentation

## 2022-07-20 DIAGNOSIS — D320 Benign neoplasm of cerebral meninges: Secondary | ICD-10-CM | POA: Diagnosis not present

## 2022-07-20 DIAGNOSIS — Z79620 Long term (current) use of immunosuppressive biologic: Secondary | ICD-10-CM | POA: Insufficient documentation

## 2022-07-20 DIAGNOSIS — C787 Secondary malignant neoplasm of liver and intrahepatic bile duct: Secondary | ICD-10-CM | POA: Insufficient documentation

## 2022-07-20 DIAGNOSIS — Z5112 Encounter for antineoplastic immunotherapy: Secondary | ICD-10-CM | POA: Diagnosis not present

## 2022-07-20 LAB — OCCULT BLOOD X 1 CARD TO LAB, STOOL
Fecal Occult Bld: NEGATIVE
Fecal Occult Bld: POSITIVE — AB
Fecal Occult Bld: POSITIVE — AB

## 2022-08-01 ENCOUNTER — Telehealth: Payer: Self-pay | Admitting: *Deleted

## 2022-08-01 NOTE — Telephone Encounter (Signed)
Patient made aware that stool cards were positive.  Per Dr. Ellin Saba, will refer her to Dr. Levon Hedger, per her preference.

## 2022-08-02 ENCOUNTER — Encounter (INDEPENDENT_AMBULATORY_CARE_PROVIDER_SITE_OTHER): Payer: Self-pay | Admitting: *Deleted

## 2022-08-07 ENCOUNTER — Ambulatory Visit (HOSPITAL_COMMUNITY)
Admission: RE | Admit: 2022-08-07 | Discharge: 2022-08-07 | Disposition: A | Discharge status: Home / Self Care | Payer: Medicare HMO | Source: Ambulatory Visit | Attending: Hematology | Admitting: Hematology

## 2022-08-07 DIAGNOSIS — C787 Secondary malignant neoplasm of liver and intrahepatic bile duct: Secondary | ICD-10-CM | POA: Diagnosis not present

## 2022-08-07 DIAGNOSIS — C3490 Malignant neoplasm of unspecified part of unspecified bronchus or lung: Secondary | ICD-10-CM | POA: Diagnosis not present

## 2022-08-07 MED ORDER — IOHEXOL 300 MG/ML  SOLN
100.0000 mL | Freq: Once | INTRAMUSCULAR | Status: AC | PRN
  Administered 2022-08-07: 100 mL via INTRAVENOUS

## 2022-08-08 ENCOUNTER — Inpatient Hospital Stay: Payer: Medicare HMO

## 2022-08-08 ENCOUNTER — Encounter: Payer: Self-pay | Admitting: Hematology

## 2022-08-08 ENCOUNTER — Inpatient Hospital Stay: Payer: Medicare HMO | Attending: Neurosurgery | Admitting: Hematology

## 2022-08-08 VITALS — BP 138/71 | HR 81 | Temp 97.5°F | Resp 18

## 2022-08-08 VITALS — BP 129/77 | HR 81 | Temp 96.6°F | Resp 20 | Wt 237.4 lb

## 2022-08-08 DIAGNOSIS — Z95828 Presence of other vascular implants and grafts: Secondary | ICD-10-CM

## 2022-08-08 DIAGNOSIS — Z7901 Long term (current) use of anticoagulants: Secondary | ICD-10-CM | POA: Diagnosis not present

## 2022-08-08 DIAGNOSIS — C3492 Malignant neoplasm of unspecified part of left bronchus or lung: Secondary | ICD-10-CM

## 2022-08-08 DIAGNOSIS — D320 Benign neoplasm of cerebral meninges: Secondary | ICD-10-CM | POA: Diagnosis not present

## 2022-08-08 DIAGNOSIS — C3412 Malignant neoplasm of upper lobe, left bronchus or lung: Secondary | ICD-10-CM | POA: Diagnosis not present

## 2022-08-08 DIAGNOSIS — Z5112 Encounter for antineoplastic immunotherapy: Secondary | ICD-10-CM | POA: Diagnosis not present

## 2022-08-08 DIAGNOSIS — I2699 Other pulmonary embolism without acute cor pulmonale: Secondary | ICD-10-CM | POA: Diagnosis not present

## 2022-08-08 DIAGNOSIS — C787 Secondary malignant neoplasm of liver and intrahepatic bile duct: Secondary | ICD-10-CM | POA: Diagnosis not present

## 2022-08-08 DIAGNOSIS — Z79620 Long term (current) use of immunosuppressive biologic: Secondary | ICD-10-CM | POA: Diagnosis not present

## 2022-08-08 DIAGNOSIS — Z5111 Encounter for antineoplastic chemotherapy: Secondary | ICD-10-CM | POA: Diagnosis not present

## 2022-08-08 LAB — CBC WITH DIFFERENTIAL/PLATELET
Abs Immature Granulocytes: 0.01 10*3/uL (ref 0.00–0.07)
Basophils Absolute: 0 10*3/uL (ref 0.0–0.1)
Basophils Relative: 1 %
Eosinophils Absolute: 0.1 10*3/uL (ref 0.0–0.5)
Eosinophils Relative: 2 %
HCT: 30.9 % — ABNORMAL LOW (ref 36.0–46.0)
Hemoglobin: 9.2 g/dL — ABNORMAL LOW (ref 12.0–15.0)
Immature Granulocytes: 0 %
Lymphocytes Relative: 15 %
Lymphs Abs: 0.7 10*3/uL (ref 0.7–4.0)
MCH: 28.7 pg (ref 26.0–34.0)
MCHC: 29.8 g/dL — ABNORMAL LOW (ref 30.0–36.0)
MCV: 96.3 fL (ref 80.0–100.0)
Monocytes Absolute: 0.5 10*3/uL (ref 0.1–1.0)
Monocytes Relative: 9 %
Neutro Abs: 3.6 10*3/uL (ref 1.7–7.7)
Neutrophils Relative %: 73 %
Platelets: 439 10*3/uL — ABNORMAL HIGH (ref 150–400)
RBC: 3.21 MIL/uL — ABNORMAL LOW (ref 3.87–5.11)
RDW: 16.9 % — ABNORMAL HIGH (ref 11.5–15.5)
WBC: 4.9 10*3/uL (ref 4.0–10.5)
nRBC: 0 % (ref 0.0–0.2)

## 2022-08-08 LAB — COMPREHENSIVE METABOLIC PANEL
ALT: 14 U/L (ref 0–44)
AST: 18 U/L (ref 15–41)
Albumin: 2.5 g/dL — ABNORMAL LOW (ref 3.5–5.0)
Alkaline Phosphatase: 88 U/L (ref 38–126)
Anion gap: 7 (ref 5–15)
BUN: 7 mg/dL — ABNORMAL LOW (ref 8–23)
CO2: 24 mmol/L (ref 22–32)
Calcium: 8.4 mg/dL — ABNORMAL LOW (ref 8.9–10.3)
Chloride: 107 mmol/L (ref 98–111)
Creatinine, Ser: 0.8 mg/dL (ref 0.44–1.00)
GFR, Estimated: >60 mL/min (ref >60)
Glucose, Bld: 96 mg/dL (ref 70–99)
Potassium: 4.1 mmol/L (ref 3.5–5.1)
Sodium: 138 mmol/L (ref 135–145)
Total Bilirubin: 0.5 mg/dL (ref 0.3–1.2)
Total Protein: 6.1 g/dL — ABNORMAL LOW (ref 6.5–8.1)

## 2022-08-08 LAB — MAGNESIUM: Magnesium: 1.9 mg/dL (ref 1.7–2.4)

## 2022-08-08 LAB — TSH: TSH: 1.617 u[IU]/mL (ref 0.350–4.500)

## 2022-08-08 MED ORDER — SODIUM CHLORIDE 0.9 % IV SOLN
400.0000 mg/m2 | Freq: Once | INTRAVENOUS | Status: AC
  Administered 2022-08-08: 900 mg via INTRAVENOUS
  Filled 2022-08-08: qty 20

## 2022-08-08 MED ORDER — SODIUM CHLORIDE 0.9 % IV SOLN: Freq: Once | INTRAVENOUS | Status: AC

## 2022-08-08 MED ORDER — SODIUM CHLORIDE 0.9% FLUSH
10.0000 mL | INTRAVENOUS | Status: DC | PRN
  Administered 2022-08-08: 10 mL

## 2022-08-08 MED ORDER — SODIUM CHLORIDE 0.9% FLUSH
10.0000 mL | Freq: Once | INTRAVENOUS | Status: AC
  Administered 2022-08-08: 10 mL via INTRAVENOUS

## 2022-08-08 MED ORDER — ONDANSETRON HCL 4 MG/2ML IJ SOLN
8.0000 mg | Freq: Once | INTRAMUSCULAR | Status: AC
  Administered 2022-08-08: 8 mg via INTRAVENOUS
  Filled 2022-08-08: qty 4

## 2022-08-08 MED ORDER — CYANOCOBALAMIN 1000 MCG/ML IJ SOLN
1000.0000 ug | Freq: Once | INTRAMUSCULAR | Status: AC
  Administered 2022-08-08: 1000 ug via INTRAMUSCULAR
  Filled 2022-08-08: qty 1

## 2022-08-08 MED ORDER — SODIUM CHLORIDE 0.9 % IV SOLN
200.0000 mg | Freq: Once | INTRAVENOUS | Status: AC
  Administered 2022-08-08: 200 mg via INTRAVENOUS
  Filled 2022-08-08: qty 8

## 2022-08-08 MED ORDER — HEPARIN SOD (PORK) LOCK FLUSH 100 UNIT/ML IV SOLN
500.0000 [IU] | Freq: Once | INTRAVENOUS | Status: AC | PRN
  Administered 2022-08-08: 500 [IU]

## 2022-08-08 NOTE — Patient Instructions (Signed)
[facility]  Discharge Instructions: Thank you for choosing [facility] to provide your oncology and hematology care.  If you have a lab appointment with the Cancer Center - please note that after April 8th, 2024, all labs will be drawn in the cancer center.  You do not have to check in or register with the main entrance as you have in the past but will complete your check-in in the cancer center.  Wear comfortable clothing and clothing appropriate for easy access to any Portacath or PICC line.   We strive to give you quality time with your provider. You may need to reschedule your appointment if you arrive late (15 or more minutes).  Arriving late affects you and other patients whose appointments are after yours.  Also, if you miss three or more appointments without notifying the office, you may be dismissed from the clinic at the provider's discretion.      For prescription refill requests, have your pharmacy contact our office and allow 72 hours for refills to be completed.    Today you received the following chemotherapy and/or immunotherapy agents Keytruda and Alimta     To help prevent nausea and vomiting after your treatment, we encourage you to take your nausea medication as directed.  Pembrolizumab Injection What is this medication? PEMBROLIZUMAB (PEM broe LIZ ue mab) treats some types of cancer. It works by helping your immune system slow or stop the spread of cancer cells. It is a monoclonal antibody. This medicine may be used for other purposes; ask your health care provider or pharmacist if you have questions. COMMON BRAND NAME(S): Keytruda What should I tell my care team before I take this medication? They need to know if you have any of these conditions: Allogeneic stem cell transplant (uses someone else's stem cells) Autoimmune diseases, such as Crohn disease, ulcerative colitis, lupus History of chest radiation Nervous system problems, such  as Guillain-Barre syndrome, myasthenia gravis Organ transplant An unusual or allergic reaction to pembrolizumab, other medications, foods, dyes, or preservatives Pregnant or trying to get pregnant Breast-feeding How should I use this medication? This medication is injected into a vein. It is given by your care team in a hospital or clinic setting. A special MedGuide will be given to you before each treatment. Be sure to read this information carefully each time. Talk to your care team about the use of this medication in children. While it may be prescribed for children as young as 6 months for selected conditions, precautions do apply. Overdosage: If you think you have taken too much of this medicine contact a poison control center or emergency room at once. NOTE: This medicine is only for you. Do not share this medicine with others. What if I miss a dose? Keep appointments for follow-up doses. It is important not to miss your dose. Call your care team if you are unable to keep an appointment. What may interact with this medication? Interactions have not been studied. This list may not describe all possible interactions. Give your health care provider a list of all the medicines, herbs, non-prescription drugs, or dietary supplements you use. Also tell them if you smoke, drink alcohol, or use illegal drugs. Some items may interact with your medicine. What should I watch for while using this medication? Your condition will be monitored carefully while you are receiving this medication. You may need blood work while taking this medication. This medication may cause serious skin reactions. They can  happen weeks to months after starting the medication. Contact your care team right away if you notice fevers or flu-like symptoms with a rash. The rash may be red or purple and then turn into blisters or peeling of the skin. You may also notice a red rash with swelling of the face, lips, or lymph nodes in  your neck or under your arms. Tell your care team right away if you have any change in your eyesight. Talk to your care team if you may be pregnant. Serious birth defects can occur if you take this medication during pregnancy and for 4 months after the last dose. You will need a negative pregnancy test before starting this medication. Contraception is recommended while taking this medication and for 4 months after the last dose. Your care team can help you find the option that works for you. Do not breastfeed while taking this medication and for 4 months after the last dose. What side effects may I notice from receiving this medication? Side effects that you should report to your care team as soon as possible: Allergic reactions--skin rash, itching, hives, swelling of the face, lips, tongue, or throat Dry cough, shortness of breath or trouble breathing Eye pain, redness, irritation, or discharge with blurry or decreased vision Heart muscle inflammation--unusual weakness or fatigue, shortness of breath, chest pain, fast or irregular heartbeat, dizziness, swelling of the ankles, feet, or hands Hormone gland problems--headache, sensitivity to light, unusual weakness or fatigue, dizziness, fast or irregular heartbeat, increased sensitivity to cold or heat, excessive sweating, constipation, hair loss, increased thirst or amount of urine, tremors or shaking, irritability Infusion reactions--chest pain, shortness of breath or trouble breathing, feeling faint or lightheaded Kidney injury (glomerulonephritis)--decrease in the amount of urine, red or dark brown urine, foamy or bubbly urine, swelling of the ankles, hands, or feet Liver injury--right upper belly pain, loss of appetite, nausea, light-colored stool, dark yellow or brown urine, yellowing skin or eyes, unusual weakness or fatigue Pain, tingling, or numbness in the hands or feet, muscle weakness, change in vision, confusion or trouble speaking, loss of  balance or coordination, trouble walking, seizures Rash, fever, and swollen lymph nodes Redness, blistering, peeling, or loosening of the skin, including inside the mouth Sudden or severe stomach pain, bloody diarrhea, fever, nausea, vomiting Side effects that usually do not require medical attention (report to your care team if they continue or are bothersome): Bone, joint, or muscle pain Diarrhea Fatigue Loss of appetite Nausea Skin rash This list may not describe all possible side effects. Call your doctor for medical advice about side effects. You may report side effects to FDA at 1-800-FDA-1088. Where should I keep my medication? This medication is given in a hospital or clinic. It will not be stored at home. NOTE: This sheet is a summary. It may not cover all possible information. If you have questions about this medicine, talk to your doctor, pharmacist, or health care provider.  2023 Elsevier/Gold Standard (2021-07-19 00:00:00)  Pemetrexed Injection What is this medication? PEMETREXED (PEM e TREX ed) treats some types of cancer. It works by slowing down the growth of cancer cells. This medicine may be used for other purposes; ask your health care provider or pharmacist if you have questions. COMMON BRAND NAME(S): Alimta, PEMFEXY What should I tell my care team before I take this medication? They need to know if you have any of these conditions: Infection, such as chickenpox, cold sores, or herpes Kidney disease Low blood cell levels (white  cells, red cells, and platelets) Lung or breathing disease, such as asthma Radiation therapy An unusual or allergic reaction to pemetrexed, other medications, foods, dyes, or preservatives If you or your partner are pregnant or trying to get pregnant Breast-feeding How should I use this medication? This medication is injected into a vein. It is given by your care team in a hospital or clinic setting. Talk to your care team about the use  of this medication in children. Special care may be needed. Overdosage: If you think you have taken too much of this medicine contact a poison control center or emergency room at once. NOTE: This medicine is only for you. Do not share this medicine with others. What if I miss a dose? Keep appointments for follow-up doses. It is important not to miss your dose. Call your care team if you are unable to keep an appointment. What may interact with this medication? Do not take this medication with any of the following: Live virus vaccines This medication may also interact with the following: Ibuprofen This list may not describe all possible interactions. Give your health care provider a list of all the medicines, herbs, non-prescription drugs, or dietary supplements you use. Also tell them if you smoke, drink alcohol, or use illegal drugs. Some items may interact with your medicine. What should I watch for while using this medication? Your condition will be monitored carefully while you are receiving this medication. This medication may make you feel generally unwell. This is not uncommon as chemotherapy can affect healthy cells as well as cancer cells. Report any side effects. Continue your course of treatment even though you feel ill unless your care team tells you to stop. This medication can cause serious side effects. To reduce the risk, your care team may give you other medications to take before receiving this one. Be sure to follow the directions from your care team. This medication can cause a rash or redness in areas of the body that have previously had radiation therapy. If you have had radiation therapy, tell your care team if you notice a rash in this area. This medication may increase your risk of getting an infection. Call your care team for advice if you get a fever, chills, sore throat, or other symptoms of a cold or flu. Do not treat yourself. Try to avoid being around people who are  sick. Be careful brushing or flossing your teeth or using a toothpick because you may get an infection or bleed more easily. If you have any dental work done, tell your dentist you are receiving this medication. Avoid taking medications that contain aspirin, acetaminophen, ibuprofen, naproxen, or ketoprofen unless instructed by your care team. These medications may hide a fever. Check with your care team if you have severe diarrhea, nausea, and vomiting, or if you sweat a lot. The loss of too much body fluid may make it dangerous for you to take this medication. Talk to your care team if you or your partner wish to become pregnant or think either of you might be pregnant. This medication can cause serious birth defects if taken during pregnancy and for 6 months after the last dose. A negative pregnancy test is required before starting this medication. A reliable form of contraception is recommended while taking this medication and for 6 months after the last dose. Talk to your care team about reliable forms of contraception. Do not father a child while taking this medication and for 3 months after the  last dose. Use a condom while having sex during this time period. Do not breastfeed while taking this medication and for 1 week after the last dose. This medication may cause infertility. Talk to your care team if you are concerned about your fertility. What side effects may I notice from receiving this medication? Side effects that you should report to your care team as soon as possible: Allergic reactions--skin rash, itching, hives, swelling of the face, lips, tongue, or throat Dry cough, shortness of breath or trouble breathing Infection--fever, chills, cough, sore throat, wounds that don't heal, pain or trouble when passing urine, general feeling of discomfort or being unwell Kidney injury--decrease in the amount of urine, swelling of the ankles, hands, or feet Low red blood cell level--unusual  weakness or fatigue, dizziness, headache, trouble breathing Redness, blistering, peeling, or loosening of the skin, including inside the mouth Unusual bruising or bleeding Side effects that usually do not require medical attention (report to your care team if they continue or are bothersome): Fatigue Loss of appetite Nausea Vomiting This list may not describe all possible side effects. Call your doctor for medical advice about side effects. You may report side effects to FDA at 1-800-FDA-1088. Where should I keep my medication? This medication is given in a hospital or clinic. It will not be stored at home. NOTE: This sheet is a summary. It may not cover all possible information. If you have questions about this medicine, talk to your doctor, pharmacist, or health care provider.  2023 Elsevier/Gold Standard (2021-07-11 00:00:00)    BELOW ARE SYMPTOMS THAT SHOULD BE REPORTED IMMEDIATELY: *FEVER GREATER THAN 100.4 F (38 C) OR HIGHER *CHILLS OR SWEATING *NAUSEA AND VOMITING THAT IS NOT CONTROLLED WITH YOUR NAUSEA MEDICATION *UNUSUAL SHORTNESS OF BREATH *UNUSUAL BRUISING OR BLEEDING *URINARY PROBLEMS (pain or burning when urinating, or frequent urination) *BOWEL PROBLEMS (unusual diarrhea, constipation, pain near the anus) TENDERNESS IN MOUTH AND THROAT WITH OR WITHOUT PRESENCE OF ULCERS (sore throat, sores in mouth, or a toothache) UNUSUAL RASH, SWELLING OR PAIN  UNUSUAL VAGINAL DISCHARGE OR ITCHING   Items with * indicate a potential emergency and should be followed up as soon as possible or go to the Emergency Department if any problems should occur.  Please show the CHEMOTHERAPY ALERT CARD or IMMUNOTHERAPY ALERT CARD at check-in to the Emergency Department and triage nurse.  Should you have questions after your visit or need to cancel or reschedule your appointment, please contact [facility] [phone]  and follow the prompts.  Office hours are 8:00 a.m. to  4:30 p.m. Monday - Friday. Please note that voicemails left after 4:00 p.m. may not be returned until the following business day.  We are closed weekends and major holidays. You have access to a nurse at all times for urgent questions. Please call the main number to the clinic [phone] and follow the prompts.  For any non-urgent questions, you may also contact your provider using MyChart. We now offer e-Visits for anyone 43 and older to request care online for non-urgent symptoms. For details visit [email or web address].   Also download the MyChart app! Go to the app store, search "MyChart", open the app, select [facility], and log in with your MyChart username and password.

## 2022-08-08 NOTE — Progress Notes (Signed)
Patient has been examined by Dr. Katragadda. Vital signs and labs have been reviewed by MD - ANC, Creatinine, LFTs, hemoglobin, and platelets are within treatment parameters per M.D. - pt may proceed with treatment.  Primary RN and pharmacy notified.  

## 2022-08-08 NOTE — Progress Notes (Signed)
[facility] [address] [place] [address]    Clinic Day:  08/08/2022  Referring physician: Mirna Mires, MD  Patient Care Team: Mirna Mires, MD as PCP - General (Family Medicine) Jonelle Sidle, MD as PCP - Cardiology (Cardiology) Doreatha Massed, MD as Medical Oncologist (Medical Oncology)   ASSESSMENT & PLAN:   Assessment: 1.  Adenocarcinoma of left lung (HCC) -Chemoradiation therapy with carboplatin and paclitaxel from 01/07/2019 through 02/11/2019. -Consolidation immunotherapy with durvalumab from 03/19/2019 through 04/15/2018, held due to pneumonitis. -CT chest on 07/02/2019 shows left upper lobe lung mass measuring 2.6 x 2.0 cm.  It shows improvement in size. -MRI of the brain on 07/18/2019 showed left sphenoid wing meningioma measuring 2.6 x 2.0 x 2.6 cm unchanged.  No new enhancing intracranial lesion.  Increased left temporal white matter edema. -Durvalumab restarted on 07/24/2019. -She was evaluated by cardiology with a stress test.  EF was 46%.  She underwent cardiac catheterization which did not show any abnormalities. - 1 year of durvalumab completed on 07/23/2020. - Liver mass biopsy (08/08/2021): Adenocarcinoma, CK7 positive, negative for TTF-1, Napsin a, GATA3, ER, CK20, CDX2 - PET scan (08/25/2021): Right upper lobe lung nodule 9 mm, SUV 5.0.  Groundglass and solid nodule periphery of the right upper lobe 8 mm, SUV 2.45.  Right lobe liver mass 4.5 x 3.6 cm, SUV 7.78. - NGS testing: PD-L1 negative, TMB-low, MSI-stable, K-ras G12 D.  No other targetable mutations. - Carboplatin, pemetrexed and pembrolizumab 4 cycles from 09/22/2021 through 11/25/2021, followed by maintenance pemetrexed and pembrolizumab -  CT CAP (11/10/2021): Reduced size of liver mass and interval resolution of previously seen groundglass pulmonary nodules.  There is a new inflammatory right upper lobe nodule.    Plan: 1.  Metastatic adenocarcinoma of the lung to the liver and  right lung: - She is tolerating pembrolizumab and pemetrexed reasonably well. - Reviewed labs from today which showed normal LFTs.  CBC grossly normal.  Last CEA was trending down. - CT CAP on 08/07/2022: Stable postradiation change in the perihilar left lung and peripherally enhancing lesions in the right lobe of the liver. - We have again discussed that her treatment will be continued as long as it continues to help control the cancer or if she is not able to tolerate it any further. - Continue with her treatment today and in 3 weeks.  RTC 6 weeks for follow-up. - As her hemoglobin was trending down, stool tests were done which showed 2 of the 3 cards were positive.  She has an appointment to follow-up with GI. - She reports soft watery bowel movements after each meal for the past 1 week.  She reports taking Mucinex for the last few days which may rarely contribute to diarrhea.  She will stop it and see if it improves.   2.  Meningioma: - MRI of the brain on 04/03/2022: Stable sphenoid wing meningioma on the left with decreasing vasogenic edema.  3.  Unprovoked pulmonary embolism: - Continue Eliquis 5 mg twice daily indefinitely.  No bleeding issues.   4.  Leg swellings: - Continue Lasix 20 mg daily along with potassium 20 mEq daily.   5.  Dry eyes with itching: - Continue refresh eyedrops which are helping.    Orders Placed This Encounter  Procedures   Magnesium    Standing Status:   Future    Standing Expiration Date:   10/09/2023   CBC with Differential    Standing Status:  Future    Standing Expiration Date:   10/09/2023   Comprehensive metabolic panel    Standing Status:   Future    Standing Expiration Date:   10/09/2023   T4    Standing Status:   Future    Standing Expiration Date:   10/09/2023   TSH    Standing Status:   Future    Standing Expiration Date:   10/09/2023   Magnesium    Standing Status:   Future    Standing Expiration Date:   10/30/2023   CBC with  Differential    Standing Status:   Future    Standing Expiration Date:   10/30/2023   Comprehensive metabolic panel    Standing Status:   Future    Standing Expiration Date:   10/30/2023   Magnesium    Standing Status:   Future    Standing Expiration Date:   11/20/2023   CBC with Differential    Standing Status:   Future    Standing Expiration Date:   11/20/2023   Comprehensive metabolic panel    Standing Status:   Future    Standing Expiration Date:   11/20/2023   Magnesium    Standing Status:   Future    Standing Expiration Date:   12/11/2023   CBC with Differential    Standing Status:   Future    Standing Expiration Date:   12/11/2023   Comprehensive metabolic panel    Standing Status:   Future    Standing Expiration Date:   12/11/2023   T4    Standing Status:   Future    Standing Expiration Date:   12/11/2023   TSH    Standing Status:   Future    Standing Expiration Date:   12/11/2023      I,Katie Daubenspeck,acting as a scribe for Doreatha Massed, MD.,have documented all relevant documentation on the behalf of Doreatha Massed, MD,as directed by  Doreatha Massed, MD while in the presence of Doreatha Massed, MD.   I, Doreatha Massed MD, have reviewed the above documentation for accuracy and completeness, and I agree with the above.   Doreatha Massed, MD   5/21/20245:29 PM  CHIEF COMPLAINT:   Diagnosis: stage IV adenocarcinoma of the left lung, negative for targetable mutations    Cancer Staging  Adenocarcinoma of left lung [facility]) Staging form: Lung, AJCC 8th Edition - Clinical stage from 01/02/2019: Stage IIIB (cT3, cN2, cM0) - Signed by Doreatha Massed, MD on 01/02/2019 - Pathologic stage from 09/07/2021: Stage IVA (pTX, pNX, pM1b) - Unsigned    Prior Therapy: 1. Chemoradiation with carboplatin and paclitaxel from 01/07/2019 to 02/11/2019. 2. Consolidation with durvalumab from 03/19/2019 to 04/16/2019, held due to pneumonitis.  Current  Therapy:  Pemetrexed and pembrolizumab maintenance    HISTORY OF PRESENT ILLNESS:   Oncology History  Adenocarcinoma of left lung (HCC)  12/09/2018 Initial Diagnosis   Adenocarcinoma of left lung (HCC)   01/02/2019 Cancer Staging   Staging form: Lung, AJCC 8th Edition - Clinical stage from 01/02/2019: Stage IIIB (cT3, cN2, cM0) - Signed by Doreatha Massed, MD on 01/02/2019   01/07/2019 - 02/11/2019 Chemotherapy   The patient had palonosetron (ALOXI) injection 0.25 mg, 0.25 mg, Intravenous,  Once, 6 of 6 cycles Administration: 0.25 mg (01/07/2019), 0.25 mg (01/14/2019), 0.25 mg (01/21/2019), 0.25 mg (01/28/2019), 0.25 mg (02/04/2019), 0.25 mg (02/11/2019) CARBOplatin (PARAPLATIN) 270 mg in sodium chloride 0.9 % 250 mL chemo infusion, 270 mg (100 % of original dose 266.4 mg), Intravenous,  Once, 6 of 6 cycles Dose modification:   (original dose 266.4 mg, Cycle 1),   (original dose 266.4 mg, Cycle 2),   (original dose 266.4 mg, Cycle 3),   (original dose 266.4 mg, Cycle 4) Administration: 270 mg (01/07/2019), 270 mg (01/14/2019), 270 mg (01/21/2019), 270 mg (01/28/2019), 270 mg (02/04/2019), 270 mg (02/11/2019) PACLitaxel (TAXOL) 108 mg in sodium chloride 0.9 % 250 mL chemo infusion (</= 80mg /m2), 45 mg/m2 = 108 mg, Intravenous,  Once, 6 of 6 cycles Administration: 108 mg (01/07/2019), 108 mg (01/14/2019), 108 mg (01/21/2019), 108 mg (01/28/2019), 108 mg (02/04/2019), 108 mg (02/11/2019) fosaprepitant (EMEND) 150 mg, dexamethasone (DECADRON) 12 mg in sodium chloride 0.9 % 145 mL IVPB, , Intravenous,  Once, 5 of 5 cycles Administration:  (01/14/2019),  (01/21/2019),  (01/28/2019),  (02/04/2019),  (02/11/2019)  for chemotherapy treatment.    03/19/2019 - 07/23/2020 Chemotherapy   Patient is on Treatment Plan : LUNG DURVALUMAB Q14D     09/22/2021 - 11/04/2021 Chemotherapy   Patient is on Treatment Plan : LUNG Carboplatin (5) + Pemetrexed (500) + Pembrolizumab (200) D1 q21d Induction x 4 cycles /  Maintenance Pemetrexed (500) + Pembrolizumab (200) D1 q21d     09/22/2021 -  Chemotherapy   Patient is on Treatment Plan : LUNG Carboplatin (5) + Pemetrexed (500) + Pembrolizumab (200) D1 q21d Induction x 4 cycles / Maintenance Pemetrexed (500) + Pembrolizumab (200) D1 q21d        INTERVAL HISTORY:   Messiah is a 72 y.o. female presenting to clinic today for follow up of stage IV adenocarcinoma of the left lung. She was last seen by me on 07/17/22.  Since her last visit, she underwent restaging CT C/A/P yesterday, 08/17/22.   Today, she states that she is doing well overall. Her appetite level is at 100%. Her energy level is at 25%.  PAST MEDICAL HISTORY:   Past Medical History: Past Medical History:  Diagnosis Date   Anemia    Aortic stenosis    Arthritis    Coronary artery calcification seen on CT scan    Essential hypertension    GERD (gastroesophageal reflux disease)    History of lung cancer    Stage III adenocarcinoma status post chemoradiation   Port-A-Cath in place 01/06/2019   Type 2 diabetes mellitus (HCC)     Surgical History: Past Surgical History:  Procedure Laterality Date   CHOLECYSTECTOMY  1997   COLONOSCOPY     GASTRIC BYPASS     INCISIONAL HERNIA REPAIR  04/11/11   IR IMAGING GUIDED PORT INSERTION  12/27/2018   Right   IR US GUIDE BX ASP/DRAIN  08/08/2021   LAPAROSCOPIC SALPINGOOPHERECTOMY     LAPAROTOMY  04/11/2011   Procedure: EXPLORATORY LAPAROTOMY;  Surgeon: Clovis Pu. Cornett, MD;  Location: [facility];  Service: General;  Laterality: N/A;  closure port hole   RIGHT/LEFT HEART CATH AND CORONARY ANGIOGRAPHY N/A 12/29/2019   Procedure: RIGHT/LEFT HEART CATH AND CORONARY ANGIOGRAPHY;  Surgeon: Lennette Bihari, MD;  Location: [facility];  Service: Cardiovascular;  Laterality: N/A;   TOTAL HIP ARTHROPLASTY  03/07/2012   Procedure: TOTAL HIP ARTHROPLASTY ANTERIOR APPROACH;  Surgeon: Shelda Pal, MD;  Location: [facility];  Service: Orthopedics;  Laterality:  Right;   TOTAL SHOULDER ARTHROPLASTY Left 01/21/2015   TOTAL SHOULDER ARTHROPLASTY Left 01/21/2015   Procedure: LEFT TOTAL SHOULDER ARTHROPLASTY;  Surgeon: Francena Hanly, MD;  Location: [facility];  Service: Orthopedics;  Laterality: Left;   VAGINAL HYSTERECTOMY  Social History: Social History   Socioeconomic History   Marital status: Single    Spouse name: Not on file   Number of children: Not on file   Years of education: 12th grade   Highest education level: Not on file  Occupational History   Occupation: Employed    Employer: [employer]  Tobacco Use   Smoking status: Former    Packs/day: 1.00    Years: 12.00    Additional pack years: 0.00    Total pack years: 12.00    Types: Cigarettes    Quit date: 03/20/1976    Years since quitting: 46.4   Smokeless tobacco: Never  Vaping Use   Vaping Use: Never used  Substance and Sexual Activity   Alcohol use: No   Drug use: No   Sexual activity: Not Currently  Other Topics Concern   Not on file  Social History Narrative   Not on file   Social Determinants of Health   Financial Resource Strain: Low Risk  (12/06/2018)   Overall Financial Resource Strain (CARDIA)    Difficulty of Paying Living Expenses: Not very hard  Food Insecurity: No Food Insecurity (12/06/2018)   Hunger Vital Sign    Worried About Running Out of Food in the Last Year: Never true    Ran Out of Food in the Last Year: Never true  Transportation Needs: No Transportation Needs (12/06/2018)   PRAPARE - Administrator, Civil Service (Medical): No    Lack of Transportation (Non-Medical): No  Physical Activity: Inactive (12/06/2018)   Exercise Vital Sign    Days of Exercise per Week: 0 days    Minutes of Exercise per Session: 0 min  Stress: No Stress Concern Present (12/06/2018)   [employer] of Occupational Health - Occupational Stress Questionnaire    Feeling of Stress : Only a little  Social Connections: Moderately Isolated  (12/06/2018)   Social Connection and Isolation Panel [NHANES]    Frequency of Communication with Friends and Family: More than three times a week    Frequency of Social Gatherings with Friends and Family: Once a week    Attends Religious Services: More than 4 times per year    Active Member of [employer] or Organizations: No    Attends Banker Meetings: Never    Marital Status: Never married  Intimate Partner Violence: Not At Risk (12/06/2018)   Humiliation, Afraid, Rape, and Kick questionnaire    Fear of Current or Ex-Partner: No    Emotionally Abused: No    Physically Abused: No    Sexually Abused: No    Family History: Family History  Problem Relation Age of Onset   Breast cancer Mother    COPD Mother    Arthritis Mother    Diabetes Mother    Hypertension Mother    Hypertension Father    Diabetes Father    Breast cancer Sister    Thyroid cancer Brother    Huntington's disease Maternal Grandmother    Heart attack Brother     Current Medications:  Current Outpatient Medications:    Calcium Carbonate Antacid (TUMS PO), Take 4 tablets by mouth daily., Disp: , Rfl:    carvedilol (COREG) 3.125 MG tablet, TAKE 1 TABLET BY MOUTH 2 TIMES DAILY., Disp: 180 tablet, Rfl: 1   Cyanocobalamin (VITAMIN B-12) 2500 MCG SUBL, Place 2,500 mcg under the tongue every morning. , Disp: , Rfl:    diphenhydrAMINE (BENADRYL) 25 MG tablet, Take 25 mg by mouth  every 6 (six) hours as needed for itching or allergies., Disp: , Rfl:    ELIQUIS 5 MG TABS tablet, TAKE 1 TABLET BY MOUTH TWICE A DAY, Disp: 60 tablet, Rfl: 6   ferrous sulfate 325 (65 FE) MG tablet, Take 325 mg by mouth every evening., Disp: , Rfl:    fluticasone (FLONASE) 50 MCG/ACT nasal spray, Place 1 spray into both nostrils 2 (two) times daily., Disp: 16 g, Rfl: 2   folic acid (FOLVITE) 1 MG tablet, TAKE 1 TABLET BY MOUTH EVERY DAY, Disp: 90 tablet, Rfl: 1   furosemide (LASIX) 20 MG tablet, Take 20 mg by mouth 2 (two) times daily.,  Disp: , Rfl:    gabapentin (NEURONTIN) 300 MG capsule, Take 300 mg by mouth 2 (two) times daily., Disp: , Rfl:    Multiple Vitamin (MULITIVITAMIN WITH MINERALS) TABS, Take 1 tablet by mouth daily., Disp: , Rfl:    omeprazole (PRILOSEC) 20 MG capsule, Take 20 mg by mouth daily., Disp: , Rfl:    Potassium Chloride ER 20 MEQ TBCR, Take 1 tablet by mouth 2 (two) times daily., Disp: , Rfl:  No current facility-administered medications for this visit.  Facility-Administered Medications Ordered in Other Visits:    magnesium sulfate 2 GM/50ML IVPB, , , ,    sodium chloride flush (NS) 0.9 % injection 10 mL, 10 mL, Intracatheter, PRN, Doreatha Massed, MD, 10 mL at 08/08/22 1600   Allergies: No Known Allergies  REVIEW OF SYSTEMS:   Review of Systems  Constitutional:  Negative for chills, fatigue and fever.  HENT:   Negative for lump/mass, mouth sores, nosebleeds, sore throat and trouble swallowing.   Eyes:  Negative for eye problems.  Respiratory:  Positive for shortness of breath. Negative for cough.   Cardiovascular:  Positive for leg swelling. Negative for chest pain and palpitations.  Gastrointestinal:  Positive for diarrhea. Negative for abdominal pain, constipation, nausea and vomiting.  Genitourinary:  Negative for bladder incontinence, difficulty urinating, dysuria, frequency, hematuria and nocturia.   Musculoskeletal:  Negative for arthralgias, back pain, flank pain, myalgias and neck pain.  Skin:  Negative for itching and rash.  Neurological:  Positive for dizziness and numbness. Negative for headaches.  Hematological:  Does not bruise/bleed easily.  Psychiatric/Behavioral:  Negative for depression, sleep disturbance and suicidal ideas. The patient is not nervous/anxious.   All other systems reviewed and are negative.    VITALS:   There were no vitals taken for this visit.  Wt Readings from Last 3 Encounters:  08/08/22 237 lb 6.4 oz (107.7 kg)  07/17/22 238 lb 9.6 oz (108.2  kg)  06/26/22 237 lb 3.4 oz (107.6 kg)    There is no height or weight on file to calculate BMI.  Performance status (ECOG): 1 - Symptomatic but completely ambulatory  PHYSICAL EXAM:   Physical Exam Vitals and nursing note reviewed. Exam conducted with a chaperone present.  Constitutional:      Appearance: Normal appearance.  Cardiovascular:     Rate and Rhythm: Normal rate and regular rhythm.     Pulses: Normal pulses.     Heart sounds: Normal heart sounds.  Pulmonary:     Effort: Pulmonary effort is normal.     Breath sounds: Normal breath sounds.  Abdominal:     Palpations: Abdomen is soft. There is no hepatomegaly, splenomegaly or mass.     Tenderness: There is no abdominal tenderness.  Musculoskeletal:     Right lower leg: No edema.     Left  lower leg: No edema.  Lymphadenopathy:     Cervical: No cervical adenopathy.     Right cervical: No superficial, deep or posterior cervical adenopathy.    Left cervical: No superficial, deep or posterior cervical adenopathy.     Upper Body:     Right upper body: No supraclavicular or axillary adenopathy.     Left upper body: No supraclavicular or axillary adenopathy.  Neurological:     General: No focal deficit present.     Mental Status: She is alert and oriented to person, place, and time.  Psychiatric:        Mood and Affect: Mood normal.        Behavior: Behavior normal.     LABS:      Latest Ref Rng & Units 08/08/2022   12:04 PM 07/17/2022   10:53 AM 06/26/2022   12:00 PM  CBC  WBC 4.0 - 10.5 K/uL 4.9  4.7  6.8   Hemoglobin 12.0 - 15.0 g/dL 9.2  8.9  9.9   Hematocrit 36.0 - 46.0 % 30.9  30.0  32.5   Platelets 150 - 400 K/uL 439  433  398       Latest Ref Rng & Units 08/08/2022   12:04 PM 07/17/2022   10:53 AM 06/26/2022   12:00 PM  CMP  Glucose 70 - 99 mg/dL 96  161  096   BUN 8 - 23 mg/dL 7  6  11    Creatinine 0.44 - 1.00 mg/dL 0.45  4.09  8.11   Sodium 135 - 145 mmol/L 138  139  139   Potassium 3.5 - 5.1 mmol/L  4.1  3.7  4.0   Chloride 98 - 111 mmol/L 107  104  106   CO2 22 - 32 mmol/L 24  26  26    Calcium 8.9 - 10.3 mg/dL 8.4  8.5  8.6   Total Protein 6.5 - 8.1 g/dL 6.1  6.3  5.9   Total Bilirubin 0.3 - 1.2 mg/dL 0.5  0.5  0.5   Alkaline Phos 38 - 126 U/L 88  80  73   AST 15 - 41 U/L 18  17  21    ALT 0 - 44 U/L 14  14  24       No results found for: "CEA1", "CEA" / No results found for: "CEA1", "CEA" No results found for: "PSA1" No results found for: "BJY782" No results found for: "CAN125"  No results found for: "TOTALPROTELP", "ALBUMINELP", "A1GS", "A2GS", "BETS", "BETA2SER", "GAMS", "MSPIKE", "SPEI" Lab Results  Component Value Date   TIBC 227 (L) 06/05/2022   FERRITIN 310 (H) 06/05/2022   IRONPCTSAT 17 06/05/2022   Lab Results  Component Value Date   LDH 237 (H) 07/17/2022     STUDIES:   CT CHEST ABDOMEN PELVIS W CONTRAST  Result Date: 08/08/2022 CLINICAL DATA:  Restaging metastatic non-small cell lung cancer. * Tracking Code: BO * EXAM: CT CHEST, ABDOMEN, AND PELVIS WITH CONTRAST TECHNIQUE: Multidetector CT imaging of the chest, abdomen and pelvis was performed following the standard protocol during bolus administration of intravenous contrast. RADIATION DOSE REDUCTION: This exam was performed according to the departmental dose-optimization program which includes automated exposure control, adjustment of the mA and/or kV according to patient size and/or use of iterative reconstruction technique. CONTRAST:  OMNIPAQUE IOHEXOL 300 MG/ML  SOLN COMPARISON:  05/09/2022 FINDINGS: CT CHEST FINDINGS Cardiovascular: Heart size is normal. Trace pericardial effusion, unchanged. Aortic atherosclerosis. Mediastinum/Nodes: No enlarged mediastinal, hilar, or axillary lymph  nodes. Thyroid gland, trachea, and esophagus demonstrate no significant findings. Lungs/Pleura: No pleural effusion or pneumothorax. Ground-glass attenuation and interstitial reticulation is identified within the lung bases and  appears unchanged from previous exam. Masslike architectural distortion, fibrosis and volume loss is again seen involving the perihilar left lung. Findings are compatible with changes secondary to external beam radiation. Changes appears stable from the previous exam. Subpleural nodule within the periphery of the right upper lobe measures 1.2 x 1.2 cm, image 57/4. Unchanged from the previous exam. No new findings identified within the lungs. Musculoskeletal: Status post left shoulder arthroplasty. No acute or suspicious osseous findings. CT ABDOMEN PELVIS FINDINGS Hepatobiliary: Status post cholecystectomy. Unchanged biliary dilatation. Peripherally enhancing lesion within the right lobe of liver measures 3.7 x 4.1 cm, image 55/2. Previously 4.2 x 3.8 cm. Lesion within the inferior right hepatic lobe measures 3.2 x 3.1 cm, image 61/2. Previously 3.2 x 2.7 cm. Small cyst in the left lobe of liver is unchanged measuring 1.1 cm. Pancreas: Unremarkable. No pancreatic ductal dilatation or surrounding inflammatory changes. Spleen: Normal in size without focal abnormality. Adrenals/Urinary Tract: Normal adrenal glands. No nephrolithiasis, hydronephrosis or suspicious mass. Urinary bladder appears unremarkable. Stomach/Bowel: Postop change from gastric bypass. No pathologic dilatation of the large or small bowel loops. Midline ventral abdominal wall hernia at the level of the umbilicus contains nonobstructed loops of small bowel, similar to the previous exam. The appendix is visualized and appears normal. No bowel wall thickening, inflammation or distension. Colonic diverticulosis without signs of acute diverticulitis. Vascular/Lymphatic: Aortic atherosclerosis. No aneurysm. No mesenteric adenopathy. No signs of pelvic or inguinal adenopathy. Reproductive: Calcified uterine fibroid is again noted within the pelvis. No suspicious mass identified. Other: No ascites or fluid collection. No signs of pneumoperitoneum.  Musculoskeletal: Right hip arthroplasty. No acute or suspicious osseous findings. Lumbar degenerative disc disease. IMPRESSION: 1. Stable CT of the chest, abdomen and pelvis. 2. Similar appearance of radiation change within the perihilar left lung. Small right lung nodules are stable in the interval. 3. Peripherally enhancing lesions within the right lobe of liver are again seen and appear stable in the interval. No new liver lesions identified. 4. Midline ventral abdominal wall hernia at the level of the umbilicus contains nonobstructed loops of small bowel. 5.  Aortic Atherosclerosis (ICD10-I70.0). Electronically Signed   By: Signa Kell M.D.   On: 08/08/2022 13:42   ECHOCARDIOGRAM COMPLETE  Result Date: 07/19/2022    ECHOCARDIOGRAM REPORT   Patient Name:   Denise [name] Date of Exam: 07/19/2022 Medical Rec #:  [id number]   Height:       62.0 in Accession #:    [id number]  Weight:       238.6 lb Date of Birth:  [birth year]    BSA:          2.061 m Patient Age:    71 years    BP:           121/75 mmHg Patient Gender: F           HR:           79 bpm. [facility] Procedure: 2D Echo, Cardiac Doppler and Color Doppler Indications:    Aortic stenosis I35.0  History:        Patient has prior history of Echocardiogram examinations, most                 recent 07/18/2021. Aortic Valve Disease; Risk  Factors:Hypertension, Diabetes and Former Smoker. Hx of lung                 cancer and Aortic stenosis. Morbid Obesity.  Sonographer:    Celesta Gentile RCS Referring Phys: [phone] SAMUEL G MCDOWELL IMPRESSIONS  1. Left ventricular ejection fraction, by estimation, is 60 to 65%. The left ventricle has normal function. The left ventricle has no regional wall motion abnormalities. There is moderate left ventricular hypertrophy. Left ventricular diastolic parameters are consistent with Grade I diastolic dysfunction (impaired relaxation).  2. Ventricular septum is flattened in systole suggesting RV pressure  overload. . Right ventricular systolic function is normal. The right ventricular size is mildly enlarged. There is severely elevated pulmonary artery systolic pressure.  3. Left atrial size was mildly dilated.  4. The mitral valve is abnormal. Trivial mitral valve regurgitation. No evidence of mitral stenosis. Moderate mitral annular calcification.  5. The tricuspid valve is abnormal. Tricuspid valve regurgitation is moderate.  6. The aortic valve is tricuspid. There is moderate calcification of the aortic valve. There is moderate thickening of the aortic valve. Aortic valve regurgitation is moderate. Severe aortic valve stenosis. Aortic valve mean gradient measures 42.0 mmHg.  Aortic valve peak gradient measures 68.3 mmHg. Aortic valve area, by VTI measures 0.92 cm.  7. The inferior vena cava is normal in size with greater than 50% respiratory variability, suggesting right atrial pressure of 3 mmHg. FINDINGS  Left Ventricle: Left ventricular ejection fraction, by estimation, is 60 to 65%. The left ventricle has normal function. The left ventricle has no regional wall motion abnormalities. The left ventricular internal cavity size was normal in size. There is  moderate left ventricular hypertrophy. Left ventricular diastolic parameters are consistent with Grade I diastolic dysfunction (impaired relaxation). Normal left ventricular filling pressure. Right Ventricle: Ventricular septum is flattened in systole suggesting RV pressure overload. The right ventricular size is mildly enlarged. Right vetricular wall thickness was not well visualized. Right ventricular systolic function is normal. There is severely elevated pulmonary artery systolic pressure. The tricuspid regurgitant velocity is 3.78 m/s, and with an assumed right atrial pressure of 3 mmHg, the estimated right ventricular systolic pressure is 60.2 mmHg. Left Atrium: Left atrial size was mildly dilated. Right Atrium: Right atrial size was normal in size.  Pericardium: Trivial pericardial effusion is present. The pericardial effusion is circumferential. Mitral Valve: The mitral valve is abnormal. There is moderate thickening of the mitral valve leaflet(s). There is moderate calcification of the mitral valve leaflet(s). Moderate mitral annular calcification. Trivial mitral valve regurgitation. No evidence of mitral valve stenosis. Tricuspid Valve: The tricuspid valve is abnormal. Tricuspid valve regurgitation is moderate . No evidence of tricuspid stenosis. Aortic Valve: The aortic valve is tricuspid. There is moderate calcification of the aortic valve. There is moderate thickening of the aortic valve. There is moderate aortic valve annular calcification. Aortic valve regurgitation is moderate. Aortic regurgitation PHT measures 467 msec. Severe aortic stenosis is present. Aortic valve mean gradient measures 42.0 mmHg. Aortic valve peak gradient measures 68.3 mmHg. Aortic valve area, by VTI measures 0.92 cm. Pulmonic Valve: The pulmonic valve was not well visualized. Pulmonic valve regurgitation is not visualized. No evidence of pulmonic stenosis. Aorta: The aortic root is normal in size and structure. Venous: The inferior vena cava is normal in size with greater than 50% respiratory variability, suggesting right atrial pressure of 3 mmHg. IAS/Shunts: No atrial level shunt detected by color flow Doppler.  LEFT VENTRICLE PLAX 2D LVIDd:  4.10 cm   Diastology LVIDs:         2.50 cm   LV e' medial:    4.35 cm/s LV PW:         1.40 cm   LV E/e' medial:  22.1 LV IVS:        1.40 cm   LV e' lateral:   7.18 cm/s LVOT diam:     1.80 cm   LV E/e' lateral: 13.4 LV SV:         85 LV SV Index:   41 LVOT Area:     2.54 cm  RIGHT VENTRICLE            IVC RV S prime:     7.83 cm/s  IVC diam: 2.10 cm TAPSE (M-mode): 2.2 cm LEFT ATRIUM           Index        RIGHT ATRIUM           Index LA diam:      3.70 cm 1.80 cm/m   RA Area:     19.90 cm LA Vol (A2C): 90.5 ml 43.92 ml/m   RA Volume:   57.70 ml  28.00 ml/m LA Vol (A4C): 84.9 ml 41.20 ml/m  AORTIC VALVE AV Area (Vmax):    0.81 cm AV Area (Vmean):   0.89 cm AV Area (VTI):     0.92 cm AV Vmax:           413.33 cm/s AV Vmean:          293.000 cm/s AV VTI:            0.927 m AV Peak Grad:      68.3 mmHg AV Mean Grad:      42.0 mmHg LVOT Vmax:         132.00 cm/s LVOT Vmean:        103.000 cm/s LVOT VTI:          0.335 m LVOT/AV VTI ratio: 0.36 AI PHT:            467 msec  AORTA Ao Root diam: 3.00 cm MITRAL VALVE                TRICUSPID VALVE MV Area (PHT): 1.96 cm     TR Peak grad:   57.2 mmHg MV Decel Time: 387 msec     TR Vmax:        378.00 cm/s MV E velocity: 96.00 cm/s MV A velocity: 123.00 cm/s  SHUNTS MV E/A ratio:  0.78         Systemic VTI:  0.34 m                             Systemic Diam: 1.80 cm Dina Rich MD Electronically signed by Dina Rich MD Signature Date/Time: 07/19/2022/2:27:13 PM    Final

## 2022-08-08 NOTE — Progress Notes (Signed)
Patient presents today for Keytruda, Alimta, and B12 injection. Patient is in satisfactory condition with no new complaints voiced.  Vital signs are stable.  Labs reviewed by Dr. Ellin Saba during the office visit and all labs are within treatment parameters.  We will proceed with treatment per MD orders.   Treatment given today per MD orders. Tolerated infusion without adverse affects. Vital signs stable. No complaints at this time. Discharged from clinic via wheelchair in stable condition. Alert and oriented x 3. F/U with [facility] as scheduled.

## 2022-08-08 NOTE — Patient Instructions (Signed)
[facility] at [facility] Discharge Instructions   You were seen and examined today by Dr. Katragadda.  He reviewed the results of your lab work which are normal/stable.   We will proceed with your treatment today.  Return as scheduled.    Thank you for choosing [facility] at [facility] to provide your oncology and hematology care.  To afford each patient quality time with our provider, please arrive at least 15 minutes before your scheduled appointment time.   If you have a lab appointment with the Cancer Center please come in thru the Main Entrance and check in at the main information desk.  You need to re-schedule your appointment should you arrive 10 or more minutes late.  We strive to give you quality time with our providers, and arriving late affects you and other patients whose appointments are after yours.  Also, if you no show three or more times for appointments you may be dismissed from the clinic at the providers discretion.     Again, thank you for choosing [facility].  Our hope is that these requests will decrease the amount of time that you wait before being seen by our physicians.       _____________________________________________________________  Should you have questions after your visit to [facility], please contact our office at [phone] and follow the prompts.  Our office hours are 8:00 a.m. and 4:30 p.m. Monday - Friday.  Please note that voicemails left after 4:00 p.m. may not be returned until the following business day.  We are closed weekends and major holidays.  You do have access to a nurse 24-7, just call the main number to the clinic [phone] and do not press any options, hold on the line and a nurse will answer the phone.    For prescription refill requests, have your pharmacy contact our office and allow 72 hours.    Due to Covid, you will need to wear a mask upon entering  the hospital. If you do not have a mask, a mask will be given to you at the Main Entrance upon arrival. For doctor visits, patients may have 1 support person age 18 or older with them. For treatment visits, patients can not have anyone with them due to social distancing guidelines and our immunocompromised population.      

## 2022-08-10 LAB — T4: T4, Total: 7.3 ug/dL (ref 4.5–12.0)

## 2022-08-21 DIAGNOSIS — D630 Anemia in neoplastic disease: Secondary | ICD-10-CM | POA: Diagnosis not present

## 2022-08-21 DIAGNOSIS — C3492 Malignant neoplasm of unspecified part of left bronchus or lung: Secondary | ICD-10-CM | POA: Diagnosis not present

## 2022-08-21 DIAGNOSIS — R0602 Shortness of breath: Secondary | ICD-10-CM | POA: Diagnosis not present

## 2022-08-21 DIAGNOSIS — R600 Localized edema: Secondary | ICD-10-CM | POA: Diagnosis not present

## 2022-08-21 DIAGNOSIS — I272 Pulmonary hypertension, unspecified: Secondary | ICD-10-CM | POA: Diagnosis not present

## 2022-08-21 DIAGNOSIS — I10 Essential (primary) hypertension: Secondary | ICD-10-CM | POA: Diagnosis not present

## 2022-08-21 DIAGNOSIS — D329 Benign neoplasm of meninges, unspecified: Secondary | ICD-10-CM | POA: Diagnosis not present

## 2022-08-21 DIAGNOSIS — R7303 Prediabetes: Secondary | ICD-10-CM | POA: Diagnosis not present

## 2022-08-29 ENCOUNTER — Inpatient Hospital Stay: Payer: Medicare HMO | Admitting: Hematology

## 2022-08-29 ENCOUNTER — Inpatient Hospital Stay: Payer: Medicare HMO

## 2022-08-29 ENCOUNTER — Inpatient Hospital Stay: Payer: Medicare HMO | Attending: Neurosurgery

## 2022-08-29 VITALS — BP 103/53 | HR 92 | Temp 96.9°F | Resp 18

## 2022-08-29 DIAGNOSIS — Z5112 Encounter for antineoplastic immunotherapy: Secondary | ICD-10-CM | POA: Insufficient documentation

## 2022-08-29 DIAGNOSIS — C3412 Malignant neoplasm of upper lobe, left bronchus or lung: Secondary | ICD-10-CM | POA: Diagnosis not present

## 2022-08-29 DIAGNOSIS — Z5111 Encounter for antineoplastic chemotherapy: Secondary | ICD-10-CM | POA: Diagnosis not present

## 2022-08-29 DIAGNOSIS — C3492 Malignant neoplasm of unspecified part of left bronchus or lung: Secondary | ICD-10-CM

## 2022-08-29 LAB — COMPREHENSIVE METABOLIC PANEL
ALT: 13 U/L (ref 0–44)
AST: 18 U/L (ref 15–41)
Albumin: 2.4 g/dL — ABNORMAL LOW (ref 3.5–5.0)
Alkaline Phosphatase: 85 U/L (ref 38–126)
Anion gap: 6 (ref 5–15)
BUN: 8 mg/dL (ref 8–23)
CO2: 25 mmol/L (ref 22–32)
Calcium: 8.2 mg/dL — ABNORMAL LOW (ref 8.9–10.3)
Chloride: 107 mmol/L (ref 98–111)
Creatinine, Ser: 0.88 mg/dL (ref 0.44–1.00)
GFR, Estimated: >60 mL/min (ref >60)
Glucose, Bld: 116 mg/dL — ABNORMAL HIGH (ref 70–99)
Potassium: 3.7 mmol/L (ref 3.5–5.1)
Sodium: 138 mmol/L (ref 135–145)
Total Bilirubin: 0.5 mg/dL (ref 0.3–1.2)
Total Protein: 6.2 g/dL — ABNORMAL LOW (ref 6.5–8.1)

## 2022-08-29 LAB — CBC WITH DIFFERENTIAL/PLATELET
Abs Immature Granulocytes: 0.01 10*3/uL (ref 0.00–0.07)
Basophils Absolute: 0.1 10*3/uL (ref 0.0–0.1)
Basophils Relative: 1 %
Eosinophils Absolute: 0.1 10*3/uL (ref 0.0–0.5)
Eosinophils Relative: 2 %
HCT: 28.5 % — ABNORMAL LOW (ref 36.0–46.0)
Hemoglobin: 8.5 g/dL — ABNORMAL LOW (ref 12.0–15.0)
Immature Granulocytes: 0 %
Lymphocytes Relative: 15 %
Lymphs Abs: 0.9 10*3/uL (ref 0.7–4.0)
MCH: 28.9 pg (ref 26.0–34.0)
MCHC: 29.8 g/dL — ABNORMAL LOW (ref 30.0–36.0)
MCV: 96.9 fL (ref 80.0–100.0)
Monocytes Absolute: 0.7 10*3/uL (ref 0.1–1.0)
Monocytes Relative: 11 %
Neutro Abs: 4.4 10*3/uL (ref 1.7–7.7)
Neutrophils Relative %: 71 %
Platelets: 475 10*3/uL — ABNORMAL HIGH (ref 150–400)
RBC: 2.94 MIL/uL — ABNORMAL LOW (ref 3.87–5.11)
RDW: 16.7 % — ABNORMAL HIGH (ref 11.5–15.5)
WBC: 6.2 10*3/uL (ref 4.0–10.5)
nRBC: 0 % (ref 0.0–0.2)

## 2022-08-29 LAB — MAGNESIUM: Magnesium: 1.7 mg/dL (ref 1.7–2.4)

## 2022-08-29 MED ORDER — SODIUM CHLORIDE 0.9 % IV SOLN
400.0000 mg/m2 | Freq: Once | INTRAVENOUS | Status: AC
  Administered 2022-08-29: 900 mg via INTRAVENOUS
  Filled 2022-08-29: qty 20

## 2022-08-29 MED ORDER — SODIUM CHLORIDE 0.9% FLUSH
10.0000 mL | INTRAVENOUS | Status: DC | PRN
  Administered 2022-08-29: 10 mL

## 2022-08-29 MED ORDER — SODIUM CHLORIDE 0.9 % IV SOLN
200.0000 mg | Freq: Once | INTRAVENOUS | Status: AC
  Administered 2022-08-29: 200 mg via INTRAVENOUS
  Filled 2022-08-29: qty 8

## 2022-08-29 MED ORDER — ONDANSETRON HCL 4 MG/2ML IJ SOLN
8.0000 mg | Freq: Once | INTRAMUSCULAR | Status: AC
  Administered 2022-08-29: 8 mg via INTRAVENOUS
  Filled 2022-08-29: qty 4

## 2022-08-29 MED ORDER — SODIUM CHLORIDE 0.9 % IV SOLN: Freq: Once | INTRAVENOUS | Status: AC

## 2022-08-29 MED ORDER — HEPARIN SOD (PORK) LOCK FLUSH 100 UNIT/ML IV SOLN
500.0000 [IU] | Freq: Once | INTRAVENOUS | Status: AC | PRN
  Administered 2022-08-29: 500 [IU]

## 2022-08-29 NOTE — Progress Notes (Signed)
Patient presents today for chemotherapy infusion.  Patient is in satisfactory condition with no new complaints voiced.  Vital signs are stable.  Labs reviewed and all labs are within treatment parameters. We will proceed with treatment per MD orders.   Patient tolerated treatment well with no complaints voiced.  Patient left ambulatory in stable condition.  Vital signs stable at discharge.  Follow up as scheduled.    

## 2022-08-29 NOTE — Patient Instructions (Signed)
[facility]  Discharge Instructions: Thank you for choosing [facility] to provide your oncology and hematology care.  If you have a lab appointment with the Cancer Center - please note that after April 8th, 2024, all labs will be drawn in the cancer center.  You do not have to check in or register with the main entrance as you have in the past but will complete your check-in in the cancer center.  Wear comfortable clothing and clothing appropriate for easy access to any Portacath or PICC line.   We strive to give you quality time with your provider. You may need to reschedule your appointment if you arrive late (15 or more minutes).  Arriving late affects you and other patients whose appointments are after yours.  Also, if you miss three or more appointments without notifying the office, you may be dismissed from the clinic at the provider's discretion.      For prescription refill requests, have your pharmacy contact our office and allow 72 hours for refills to be completed.    Today you received the following chemotherapy and/or immunotherapy agents Keytruda/Alimta.  Pembrolizumab Injection What is this medication? PEMBROLIZUMAB (PEM broe LIZ ue mab) treats some types of cancer. It works by helping your immune system slow or stop the spread of cancer cells. It is a monoclonal antibody. This medicine may be used for other purposes; ask your health care provider or pharmacist if you have questions. COMMON BRAND NAME(S): Keytruda What should I tell my care team before I take this medication? They need to know if you have any of these conditions: Allogeneic stem cell transplant (uses someone else's stem cells) Autoimmune diseases, such as Crohn disease, ulcerative colitis, lupus History of chest radiation Nervous system problems, such as Guillain-Barre syndrome, myasthenia gravis Organ transplant An unusual or allergic reaction to pembrolizumab, other  medications, foods, dyes, or preservatives Pregnant or trying to get pregnant Breast-feeding How should I use this medication? This medication is injected into a vein. It is given by your care team in a hospital or clinic setting. A special MedGuide will be given to you before each treatment. Be sure to read this information carefully each time. Talk to your care team about the use of this medication in children. While it may be prescribed for children as young as 6 months for selected conditions, precautions do apply. Overdosage: If you think you have taken too much of this medicine contact a poison control center or emergency room at once. NOTE: This medicine is only for you. Do not share this medicine with others. What if I miss a dose? Keep appointments for follow-up doses. It is important not to miss your dose. Call your care team if you are unable to keep an appointment. What may interact with this medication? Interactions have not been studied. This list may not describe all possible interactions. Give your health care provider a list of all the medicines, herbs, non-prescription drugs, or dietary supplements you use. Also tell them if you smoke, drink alcohol, or use illegal drugs. Some items may interact with your medicine. What should I watch for while using this medication? Your condition will be monitored carefully while you are receiving this medication. You may need blood work while taking this medication. This medication may cause serious skin reactions. They can happen weeks to months after starting the medication. Contact your care team right away if you notice fevers or flu-like symptoms with a rash. The  rash may be red or purple and then turn into blisters or peeling of the skin. You may also notice a red rash with swelling of the face, lips, or lymph nodes in your neck or under your arms. Tell your care team right away if you have any change in your eyesight. Talk to your care  team if you may be pregnant. Serious birth defects can occur if you take this medication during pregnancy and for 4 months after the last dose. You will need a negative pregnancy test before starting this medication. Contraception is recommended while taking this medication and for 4 months after the last dose. Your care team can help you find the option that works for you. Do not breastfeed while taking this medication and for 4 months after the last dose. What side effects may I notice from receiving this medication? Side effects that you should report to your care team as soon as possible: Allergic reactions--skin rash, itching, hives, swelling of the face, lips, tongue, or throat Dry cough, shortness of breath or trouble breathing Eye pain, redness, irritation, or discharge with blurry or decreased vision Heart muscle inflammation--unusual weakness or fatigue, shortness of breath, chest pain, fast or irregular heartbeat, dizziness, swelling of the ankles, feet, or hands Hormone gland problems--headache, sensitivity to light, unusual weakness or fatigue, dizziness, fast or irregular heartbeat, increased sensitivity to cold or heat, excessive sweating, constipation, hair loss, increased thirst or amount of urine, tremors or shaking, irritability Infusion reactions--chest pain, shortness of breath or trouble breathing, feeling faint or lightheaded Kidney injury (glomerulonephritis)--decrease in the amount of urine, red or dark Shep Porter urine, foamy or bubbly urine, swelling of the ankles, hands, or feet Liver injury--right upper belly pain, loss of appetite, nausea, light-colored stool, dark yellow or Merion Caton urine, yellowing skin or eyes, unusual weakness or fatigue Pain, tingling, or numbness in the hands or feet, muscle weakness, change in vision, confusion or trouble speaking, loss of balance or coordination, trouble walking, seizures Rash, fever, and swollen lymph nodes Redness, blistering, peeling,  or loosening of the skin, including inside the mouth Sudden or severe stomach pain, bloody diarrhea, fever, nausea, vomiting Side effects that usually do not require medical attention (report to your care team if they continue or are bothersome): Bone, joint, or muscle pain Diarrhea Fatigue Loss of appetite Nausea Skin rash This list may not describe all possible side effects. Call your doctor for medical advice about side effects. You may report side effects to FDA at 1-800-FDA-1088. Where should I keep my medication? This medication is given in a hospital or clinic. It will not be stored at home. NOTE: This sheet is a summary. It may not cover all possible information. If you have questions about this medicine, talk to your doctor, pharmacist, or health care provider.  2024 Elsevier/Gold Standard (2021-07-19 00:00:00)    Pemetrexed Injection What is this medication? PEMETREXED (PEM e TREX ed) treats some types of cancer. It works by slowing down the growth of cancer cells. This medicine may be used for other purposes; ask your health care provider or pharmacist if you have questions. COMMON BRAND NAME(S): Alimta, PEMFEXY What should I tell my care team before I take this medication? They need to know if you have any of these conditions: Infection, such as chickenpox, cold sores, or herpes Kidney disease Low blood cell levels (white cells, red cells, and platelets) Lung or breathing disease, such as asthma Radiation therapy An unusual or allergic reaction to pemetrexed, other medications,  foods, dyes, or preservatives If you or your partner are pregnant or trying to get pregnant Breast-feeding How should I use this medication? This medication is injected into a vein. It is given by your care team in a hospital or clinic setting. Talk to your care team about the use of this medication in children. Special care may be needed. Overdosage: If you think you have taken too much of this  medicine contact a poison control center or emergency room at once. NOTE: This medicine is only for you. Do not share this medicine with others. What if I miss a dose? Keep appointments for follow-up doses. It is important not to miss your dose. Call your care team if you are unable to keep an appointment. What may interact with this medication? Do not take this medication with any of the following: Live virus vaccines This medication may also interact with the following: Ibuprofen This list may not describe all possible interactions. Give your health care provider a list of all the medicines, herbs, non-prescription drugs, or dietary supplements you use. Also tell them if you smoke, drink alcohol, or use illegal drugs. Some items may interact with your medicine. What should I watch for while using this medication? Your condition will be monitored carefully while you are receiving this medication. This medication may make you feel generally unwell. This is not uncommon as chemotherapy can affect healthy cells as well as cancer cells. Report any side effects. Continue your course of treatment even though you feel ill unless your care team tells you to stop. This medication can cause serious side effects. To reduce the risk, your care team may give you other medications to take before receiving this one. Be sure to follow the directions from your care team. This medication can cause a rash or redness in areas of the body that have previously had radiation therapy. If you have had radiation therapy, tell your care team if you notice a rash in this area. This medication may increase your risk of getting an infection. Call your care team for advice if you get a fever, chills, sore throat, or other symptoms of a cold or flu. Do not treat yourself. Try to avoid being around people who are sick. Be careful brushing or flossing your teeth or using a toothpick because you may get an infection or bleed more  easily. If you have any dental work done, tell your dentist you are receiving this medication. Avoid taking medications that contain aspirin, acetaminophen, ibuprofen, naproxen, or ketoprofen unless instructed by your care team. These medications may hide a fever. Check with your care team if you have severe diarrhea, nausea, and vomiting, or if you sweat a lot. The loss of too much body fluid may make it dangerous for you to take this medication. Talk to your care team if you or your partner wish to become pregnant or think either of you might be pregnant. This medication can cause serious birth defects if taken during pregnancy and for 6 months after the last dose. A negative pregnancy test is required before starting this medication. A reliable form of contraception is recommended while taking this medication and for 6 months after the last dose. Talk to your care team about reliable forms of contraception. Do not father a child while taking this medication and for 3 months after the last dose. Use a condom while having sex during this time period. Do not breastfeed while taking this medication and for 1 week   after the last dose. This medication may cause infertility. Talk to your care team if you are concerned about your fertility. What side effects may I notice from receiving this medication? Side effects that you should report to your care team as soon as possible: Allergic reactions--skin rash, itching, hives, swelling of the face, lips, tongue, or throat Dry cough, shortness of breath or trouble breathing Infection--fever, chills, cough, sore throat, wounds that don't heal, pain or trouble when passing urine, general feeling of discomfort or being unwell Kidney injury--decrease in the amount of urine, swelling of the ankles, hands, or feet Low red blood cell level--unusual weakness or fatigue, dizziness, headache, trouble breathing Redness, blistering, peeling, or loosening of the skin,  including inside the mouth Unusual bruising or bleeding Side effects that usually do not require medical attention (report to your care team if they continue or are bothersome): Fatigue Loss of appetite Nausea Vomiting This list may not describe all possible side effects. Call your doctor for medical advice about side effects. You may report side effects to FDA at 1-800-FDA-1088. Where should I keep my medication? This medication is given in a hospital or clinic. It will not be stored at home. NOTE: This sheet is a summary. It may not cover all possible information. If you have questions about this medicine, talk to your doctor, pharmacist, or health care provider.  2024 Elsevier/Gold Standard (2021-07-12 00:00:00)        To help prevent nausea and vomiting after your treatment, we encourage you to take your nausea medication as directed.  BELOW ARE SYMPTOMS THAT SHOULD BE REPORTED IMMEDIATELY: *FEVER GREATER THAN 100.4 F (38 C) OR HIGHER *CHILLS OR SWEATING *NAUSEA AND VOMITING THAT IS NOT CONTROLLED WITH YOUR NAUSEA MEDICATION *UNUSUAL SHORTNESS OF BREATH *UNUSUAL BRUISING OR BLEEDING *URINARY PROBLEMS (pain or burning when urinating, or frequent urination) *BOWEL PROBLEMS (unusual diarrhea, constipation, pain near the anus) TENDERNESS IN MOUTH AND THROAT WITH OR WITHOUT PRESENCE OF ULCERS (sore throat, sores in mouth, or a toothache) UNUSUAL RASH, SWELLING OR PAIN  UNUSUAL VAGINAL DISCHARGE OR ITCHING   Items with * indicate a potential emergency and should be followed up as soon as possible or go to the Emergency Department if any problems should occur.  Please show the CHEMOTHERAPY ALERT CARD or IMMUNOTHERAPY ALERT CARD at check-in to the Emergency Department and triage nurse.  Should you have questions after your visit or need to cancel or reschedule your appointment, please contact [facility]ENTER AT [facility] [phone]  and follow the prompts.  Office hours are 8:00  a.m. to 4:30 p.m. Monday - Friday. Please note that voicemails left after 4:00 p.m. may not be returned until the following business day.  We are closed weekends and major holidays. You have access to a nurse at all times for urgent questions. Please call the main number to the clinic [phone] and follow the prompts.  For any non-urgent questions, you may also contact your provider using MyChart. We now offer e-Visits for anyone 70 and older to request care online for non-urgent symptoms. For details visit [email or web address].   Also download the MyChart app! Go to the app store, search "MyChart", open the app, select [facility], and log in with your MyChart username and password.

## 2022-08-29 NOTE — Progress Notes (Signed)
Patient presents today for Keytruda Alimta infusion.  Patient is in satisfactory condition with no new complaints voiced.  Vital signs are stable.  Labs reviewed and all labs are within treatment parameters. We will proceed with treatment per MD orders.

## 2022-08-29 NOTE — Progress Notes (Signed)
Maintain Alimta at 400 mg/m2  Treatment plan updated to reflect above.  T.O. Dr Carilyn Goodpasture, PharmD

## 2022-08-31 ENCOUNTER — Encounter (INDEPENDENT_AMBULATORY_CARE_PROVIDER_SITE_OTHER): Payer: Self-pay

## 2022-08-31 ENCOUNTER — Telehealth (INDEPENDENT_AMBULATORY_CARE_PROVIDER_SITE_OTHER): Payer: Self-pay | Admitting: Gastroenterology

## 2022-08-31 ENCOUNTER — Encounter (INDEPENDENT_AMBULATORY_CARE_PROVIDER_SITE_OTHER): Payer: Self-pay | Admitting: Gastroenterology

## 2022-08-31 ENCOUNTER — Ambulatory Visit (INDEPENDENT_AMBULATORY_CARE_PROVIDER_SITE_OTHER): Payer: Medicare HMO | Admitting: Gastroenterology

## 2022-08-31 VITALS — BP 96/54 | HR 125 | Temp 98.1°F | Ht 62.0 in | Wt 240.0 lb

## 2022-08-31 DIAGNOSIS — R6881 Early satiety: Secondary | ICD-10-CM

## 2022-08-31 DIAGNOSIS — D509 Iron deficiency anemia, unspecified: Secondary | ICD-10-CM | POA: Diagnosis not present

## 2022-08-31 NOTE — Patient Instructions (Signed)
As discussed I am concerned that your blood counts have worsened and your oxygen was quite low in office today. This could indicate you are losing blood from your GI tract and are symptomatic from the blood loss and I am recommending you proceed to the ER today for further evaluation, however, as you are declining evaluation in the ER, we will go ahead and schedule upper endoscopy and colonoscopy. Please proceed to the ER if you have any worsening of your symptoms as you may need more urgent evaluation!!  Follow up 3 months

## 2022-08-31 NOTE — Telephone Encounter (Signed)
    08/31/22  [name] [birth year]  What type of surgery is being performed? Colonoscopy and EGD  When is surgery scheduled? 09/25/22  Clearance to hold Eliquis for upcoming procedures  Name of physician performing surgery?  Dr. Earnest Bailey [facility]  Phone: [phone] Fax: [phone]  Anethesia type (none, local, MAC, general)? MAC

## 2022-08-31 NOTE — Telephone Encounter (Signed)
Per protocol hold Eliquis x 2 days. Will call pt once pre op is received.

## 2022-08-31 NOTE — Progress Notes (Signed)
Referring Provider: Doreatha Massed, MD Primary Care Physician:  Mirna Mires, MD Primary GI Physician: new   Chief Complaint  Patient presents with   positive stool cards    Referred for positive stool cards. Reports dark stools. Does take iron once a day.    HPI:   Denise [name] is a 72 y.o. female with past medical history of aortic stenosis, arthritis, HTN, GERD, stage III lung cancer, Type 2 DM  Patient presenting today for positive stools cards  Last hgb 8.5 on 6/11 (trending down since September 2023, 10-11 range prior to that)  Occult stool testing positive x2/3 in may Iron studies in march with iron 39, TIBC 227, sat 17, ferritin 310, B 12 2232, folate >40   Patient notably dyspenic upon arrival, O2 sat checked a few minutes later after respirations had returned to normal with O2 sat of 82%. Has supplemental O2 that she was put on last year, she wears this PRN. Notes that her O2 sat was low at chemotherapy a few days ago as well. She stopped her BP meds as her BP had been running low (96/54). Patient a/ox4 with no acute distress when O2 sat was checked.   She notes that she has not seen BRBPR, stools are dark but she takes Iron pills, has been on these since 2008. She was started on PO iron after gastric bypass surgery. She notes some SOB since she started chemotherapy in 2020. SOB is worse with exertion. She had an episode last year where she became very SOB, seen in the ED and found to have PEs, started on eliquis. Has rare dizziness. She is feeling somewhat fatigued. No abdominal pain. No nausea or vomiting. She notes early satiety, cannot even finish a baked potato without feeling very full. She is unsure when this began. She denies weight loss. No constipation or diarrhea. She has a BM daily.   She is on omeprazole 20mg  daily, no prior blood transfusions.   NSAID use: just tylenol  Social hx: no etoh or tobacco  Fam hx: no crc or liver disease   Last Colonoscopy:  [facility], 10+ years ago, normal (reports prior colonoscopy she had polyps) Last Endoscopy: never   Recommendations:    Past Medical History:  Diagnosis Date   Anemia    Aortic stenosis    Arthritis    Coronary artery calcification seen on CT scan    Essential hypertension    GERD (gastroesophageal reflux disease)    History of lung cancer    Stage III adenocarcinoma status post chemoradiation   Port-A-Cath in place 01/06/2019   Type 2 diabetes mellitus (HCC)     Past Surgical History:  Procedure Laterality Date   CHOLECYSTECTOMY  1997   COLONOSCOPY     GASTRIC BYPASS     INCISIONAL HERNIA REPAIR  04/11/11   IR IMAGING GUIDED PORT INSERTION  12/27/2018   Right   IR US GUIDE BX ASP/DRAIN  08/08/2021   LAPAROSCOPIC SALPINGOOPHERECTOMY     LAPAROTOMY  04/11/2011   Procedure: EXPLORATORY LAPAROTOMY;  Surgeon: Maisie Fus A. Cornett, MD;  Location: [facility];  Service: General;  Laterality: N/A;  closure port hole   RIGHT/LEFT HEART CATH AND CORONARY ANGIOGRAPHY N/A 12/29/2019   Procedure: RIGHT/LEFT HEART CATH AND CORONARY ANGIOGRAPHY;  Surgeon: Lennette Bihari, MD;  [facility]: [facility];  Service: Cardiovascular;  Laterality: N/A;   TOTAL HIP ARTHROPLASTY  03/07/2012   Procedure: TOTAL HIP ARTHROPLASTY ANTERIOR APPROACH;  Surgeon: Madlyn Frankel  Charlann Boxer, MD;  Location: [facility];  Service: Orthopedics;  Laterality: Right;   TOTAL SHOULDER ARTHROPLASTY Left 01/21/2015   TOTAL SHOULDER ARTHROPLASTY Left 01/21/2015   Procedure: LEFT TOTAL SHOULDER ARTHROPLASTY;  Surgeon: Francena Hanly, MD;  Location: [facility];  Service: Orthopedics;  Laterality: Left;   VAGINAL HYSTERECTOMY      Current Outpatient Medications  Medication Sig Dispense Refill   Calcium Carbonate Antacid (TUMS PO) Take 4 tablets by mouth daily.     Cyanocobalamin (VITAMIN B-12) 2500 MCG SUBL Place 2,500 mcg under the tongue every morning.      diphenhydrAMINE (BENADRYL) 25 MG tablet Take 25 mg by mouth every 6 (six) hours as  needed for itching or allergies.     ELIQUIS 5 MG TABS tablet TAKE 1 TABLET BY MOUTH TWICE A DAY 60 tablet 6   ferrous sulfate 325 (65 FE) MG tablet Take 325 mg by mouth every evening.     fluticasone (FLONASE) 50 MCG/ACT nasal spray Place 1 spray into both nostrils 2 (two) times daily. 16 g 2   folic acid (FOLVITE) 1 MG tablet TAKE 1 TABLET BY MOUTH EVERY DAY 90 tablet 1   furosemide (LASIX) 20 MG tablet Take 20 mg by mouth daily.     gabapentin (NEURONTIN) 300 MG capsule Take 300 mg by mouth at bedtime.     Multiple Vitamin (MULITIVITAMIN WITH MINERALS) TABS Take 1 tablet by mouth daily.     omeprazole (PRILOSEC) 20 MG capsule Take 20 mg by mouth daily.     Potassium Chloride ER 20 MEQ TBCR Take 1 tablet by mouth daily.     carvedilol (COREG) 3.125 MG tablet TAKE 1 TABLET BY MOUTH 2 TIMES DAILY. (Patient not taking: Reported on 08/31/2022) 180 tablet 1   No current facility-administered medications for this visit.   Facility-Administered Medications Ordered in Other Visits  Medication Dose Route Frequency Provider Last Rate Last Admin   magnesium sulfate 2 GM/50ML IVPB             Allergies as of 08/31/2022   (No Known Allergies)    Family History  Problem Relation Age of Onset   Breast cancer Mother    COPD Mother    Arthritis Mother    Diabetes Mother    Hypertension Mother    Hypertension Father    Diabetes Father    Breast cancer Sister    Thyroid cancer Brother    Huntington's disease Maternal Grandmother    Heart attack Brother     Social History   Socioeconomic History   Marital status: Single    Spouse name: Not on file   Number of children: Not on file   Years of education: 12th grade   Highest education level: Not on file  Occupational History   Occupation: Employed    Employer: [employer]  Tobacco Use   Smoking status: Former    Packs/day: 1.00    Years: 12.00    Additional pack years: 0.00    Total pack years: 12.00    Types: Cigarettes     Quit date: 03/20/1976    Years since quitting: 46.4    Passive exposure: Past   Smokeless tobacco: Never  Vaping Use   Vaping Use: Never used  Substance and Sexual Activity   Alcohol use: No   Drug use: No   Sexual activity: Not Currently  Other Topics Concern   Not on file  Social History Narrative   Not on file   Social  Determinants of Health   Financial Resource Strain: Low Risk  (12/06/2018)   Overall Financial Resource Strain (CARDIA)    Difficulty of Paying Living Expenses: Not very hard  Food Insecurity: No Food Insecurity (12/06/2018)   Hunger Vital Sign    Worried About Running Out of Food in the Last Year: Never true    Ran Out of Food in the Last Year: Never true  Transportation Needs: No Transportation Needs (12/06/2018)   PRAPARE - Administrator, Civil Service (Medical): No    Lack of Transportation (Non-Medical): No  Physical Activity: Inactive (12/06/2018)   Exercise Vital Sign    Days of Exercise per Week: 0 days    Minutes of Exercise per Session: 0 min  Stress: No Stress Concern Present (12/06/2018)   [employer] of Occupational Health - Occupational Stress Questionnaire    Feeling of Stress : Only a little  Social Connections: Moderately Isolated (12/06/2018)   Social Connection and Isolation Panel [NHANES]    Frequency of Communication with Friends and Family: More than three times a week    Frequency of Social Gatherings with Friends and Family: Once a week    Attends Religious Services: More than 4 times per year    Active Member of [employer] or Organizations: No    Attends Engineer, structural: Never    Marital Status: Never married    Review of systems General: negative for, night seats, fever, chills, weight loss +fatigue  Neck: Negative for lumps, goiter, pain and significant neck swelling Resp: Negative for cough, wheezing, dyspnea at rest +SOB with exertion  CV: Negative for chest pain, leg swelling, palpitations,  orthopnea GI: denies melena, hematochezia, nausea, vomiting, diarrhea, constipation, dysphagia, odyonophagia, or unintentional weight loss. +early satiety  MSK: Negative for joint pain or swelling, back pain, and muscle pain. Derm: Negative for itching or rash Psych: Denies depression, anxiety, memory loss, confusion. No homicidal or suicidal ideation.  Heme: Negative for prolonged bleeding, bruising easily, and swollen nodes. Endocrine: Negative for cold or heat intolerance, polyuria, polydipsia and goiter. Neuro: negative for tremor, gait imbalance, syncope and seizures. The remainder of the review of systems is noncontributory.  Physical Exam: BP (!) 96/54 (BP Location: Left Arm, Patient Position: Sitting, Cuff Size: Large)   Pulse (!) 125   Temp 98.1 F (36.7 C) (Oral)   Ht 5\' 2"  (1.575 m)   Wt 240 lb (108.9 kg)   SpO2 (!) 82%   BMI 43.90 kg/m  General:   Alert and oriented. No distress noted. Pleasant and cooperative.  Head:  Normocephalic and atraumatic. Eyes:  Conjuctiva clear without scleral icterus. Mouth:  Oral mucosa pink and moist. Good dentition. No lesions. Heart: Normal rate and rhythm, s1 and s2 heart sounds present.  Lungs: Clear lung sounds in all lobes. Respirations equal and unlabored. Abdomen:  +BS, soft, non-tender and non-distended. No rebound or guarding. No HSM or masses noted. Derm: No palmar erythema or jaundice Msk:  Symmetrical without gross deformities. Normal posture. Extremities:  Without edema. Neurologic:  Alert and  oriented x4 Psych:  Alert and cooperative. Normal mood and affect.  Invalid input(s): "6 MONTHS"   ASSESSMENT: Denise [name] is a 72 y.o. female presenting today as a new patient for positive occult stool cards and worsening anemia   Acute on chronic anemia/positive occult stool cards:  worsening anemia starting in September 2023, she has been maintained on iron pills for years since her gastric bypass, recent occult stools cards with  2/3 positive. No BRBPR, she has dark stools on iron so difficult to obtain if there is actual melena. She notes early satiety for the past few months as well. Given worsening anemia, positive occult stool cards and early satiety, would recommend proceeding with upper endoscopy and colonoscopy as I cannot rule out upper or lower GI bleeding source. Indications, risks and benefits of procedure discussed in detail with patient. Patient verbalized understanding and is in agreement to proceed with EGD/colonoscopy at this time.   Notably, patients Oxygen saturation was 82% on arrival, even after sitting for a few minutes. Oxygen was 86% on repeat at the end of our visit. Patient a/ox4 and in NAD at time oxygen levels were checked. She appears well overall, respirations equal and unlabored, she is able to carry on a conversation without issue. Discussed with patient concerns for hypoxia and worsening anemia (8.5 on 6/11), recommended patient proceed to the ER for symptomatic anemia as she may require more urgent endoscopic evaluation/transfusion, however, patient is declining at this time. I strongly encouraged patient to seek evaluation in the ER, however, she continued to decline.  PLAN:  Recommended to proceed to the ER for symptomatic anemia/hypoxia 2. EGD/Colonoscopy-ASA III (hold Eliquis x2 days)-will schedule with Marlet[facility] for sooner endo evaluation 3. Continue with PO iron 4. Continue omeprazole 20mg  daily   All questions were answered, patient verbalized understanding and is in agreement with plan as outlined above.    Follow Up: 3 months   Delvonte Berenson L. Jeanmarie Hubert, MSN, APRN, AGNP-C Adult-Gerontology Nurse Practitioner [facility]

## 2022-08-31 NOTE — Telephone Encounter (Signed)
Clearance sent to Dr.K to hold Eliquis for upcoming TCS/EGD on 09/25/22. Pt given Clenpiq sample and instructions.  Once clearance received call pt with pre op appt and how many days to hold Eliquis

## 2022-08-31 NOTE — H&P (View-Only) (Signed)
 Referring Provider: Katragadda, Sreedhar, MD Primary Care Physician:  Hill, Gerald, MD Primary GI Physician: new   Chief Complaint  Patient presents with   positive stool cards    Referred for positive stool cards. Reports dark stools. Does take iron once a day.    HPI:   Denise [name] is a 72 y.o. female with past medical history of aortic stenosis, arthritis, HTN, GERD, stage III lung cancer, Type 2 DM  Patient presenting today for positive stools cards  Last hgb 8.5 on 6/11 (trending down since September 2023, 10-11 range prior to that)  Occult stool testing positive x2/3 in may Iron studies in march with iron 39, TIBC 227, sat 17, ferritin 310, B 12 2232, folate >40   Patient notably dyspenic upon arrival, O2 sat checked a few minutes later after respirations had returned to normal with O2 sat of 82%. Has supplemental O2 that she was put on last year, she wears this PRN. Notes that her O2 sat was low at chemotherapy a few days ago as well. She stopped her BP meds as her BP had been running low (96/54). Patient a/ox4 with no acute distress when O2 sat was checked.   She notes that she has not seen BRBPR, stools are dark but she takes Iron pills, has been on these since 2008. She was started on PO iron after gastric bypass surgery. She notes some SOB since she started chemotherapy in 2020. SOB is worse with exertion. She had an episode last year where she became very SOB, seen in the ED and found to have PEs, started on eliquis. Has rare dizziness. She is feeling somewhat fatigued. No abdominal pain. No nausea or vomiting. She notes early satiety, cannot even finish a baked potato without feeling very full. She is unsure when this began. She denies weight loss. No constipation or diarrhea. She has a BM daily.   She is on omeprazole 20mg daily, no prior blood transfusions.   NSAID use: just tylenol  Social hx: no etoh or tobacco  Fam hx: no crc or liver disease   Last Colonoscopy:  [facility], 10+ years ago, normal (reports prior colonoscopy she had polyps) Last Endoscopy: never   Recommendations:    Past Medical History:  Diagnosis Date   Anemia    Aortic stenosis    Arthritis    Coronary artery calcification seen on CT scan    Essential hypertension    GERD (gastroesophageal reflux disease)    History of lung cancer    Stage III adenocarcinoma status post chemoradiation   Port-A-Cath in place 01/06/2019   Type 2 diabetes mellitus (HCC)     Past Surgical History:  Procedure Laterality Date   CHOLECYSTECTOMY  1997   COLONOSCOPY     GASTRIC BYPASS     INCISIONAL HERNIA REPAIR  04/11/11   IR IMAGING GUIDED PORT INSERTION  12/27/2018   Right   IR US GUIDE BX ASP/DRAIN  08/08/2021   LAPAROSCOPIC SALPINGOOPHERECTOMY     LAPAROTOMY  04/11/2011   Procedure: EXPLORATORY LAPAROTOMY;  Surgeon: Thomas A. Cornett, MD;  Location: [facility];  Service: General;  Laterality: N/A;  closure port hole   RIGHT/LEFT HEART CATH AND CORONARY ANGIOGRAPHY N/A 12/29/2019   Procedure: RIGHT/LEFT HEART CATH AND CORONARY ANGIOGRAPHY;  Surgeon: Kelly, Thomas A, MD;  [facility];  Service: Cardiovascular;  Laterality: N/A;   TOTAL HIP ARTHROPLASTY  03/07/2012   Procedure: TOTAL HIP ARTHROPLASTY ANTERIOR APPROACH;  Surgeon: Matthew D   Olin, MD;  Location: [facility];  Service: Orthopedics;  Laterality: Right;   TOTAL SHOULDER ARTHROPLASTY Left 01/21/2015   TOTAL SHOULDER ARTHROPLASTY Left 01/21/2015   Procedure: LEFT TOTAL SHOULDER ARTHROPLASTY;  Surgeon: Kevin Supple, MD;  Location: [facility];  Service: Orthopedics;  Laterality: Left;   VAGINAL HYSTERECTOMY      Current Outpatient Medications  Medication Sig Dispense Refill   Calcium Carbonate Antacid (TUMS PO) Take 4 tablets by mouth daily.     Cyanocobalamin (VITAMIN B-12) 2500 MCG SUBL Place 2,500 mcg under the tongue every morning.      diphenhydrAMINE (BENADRYL) 25 MG tablet Take 25 mg by mouth every 6 (six) hours as  needed for itching or allergies.     ELIQUIS 5 MG TABS tablet TAKE 1 TABLET BY MOUTH TWICE A DAY 60 tablet 6   ferrous sulfate 325 (65 FE) MG tablet Take 325 mg by mouth every evening.     fluticasone (FLONASE) 50 MCG/ACT nasal spray Place 1 spray into both nostrils 2 (two) times daily. 16 g 2   folic acid (FOLVITE) 1 MG tablet TAKE 1 TABLET BY MOUTH EVERY DAY 90 tablet 1   furosemide (LASIX) 20 MG tablet Take 20 mg by mouth daily.     gabapentin (NEURONTIN) 300 MG capsule Take 300 mg by mouth at bedtime.     Multiple Vitamin (MULITIVITAMIN WITH MINERALS) TABS Take 1 tablet by mouth daily.     omeprazole (PRILOSEC) 20 MG capsule Take 20 mg by mouth daily.     Potassium Chloride ER 20 MEQ TBCR Take 1 tablet by mouth daily.     carvedilol (COREG) 3.125 MG tablet TAKE 1 TABLET BY MOUTH 2 TIMES DAILY. (Patient not taking: Reported on 08/31/2022) 180 tablet 1   No current facility-administered medications for this visit.   Facility-Administered Medications Ordered in Other Visits  Medication Dose Route Frequency Provider Last Rate Last Admin   magnesium sulfate 2 GM/50ML IVPB             Allergies as of 08/31/2022   (No Known Allergies)    Family History  Problem Relation Age of Onset   Breast cancer Mother    COPD Mother    Arthritis Mother    Diabetes Mother    Hypertension Mother    Hypertension Father    Diabetes Father    Breast cancer Sister    Thyroid cancer Brother    Huntington's disease Maternal Grandmother    Heart attack Brother     Social History   Socioeconomic History   Marital status: Single    Spouse name: Not on file   Number of children: Not on file   Years of education: 12th grade   Highest education level: Not on file  Occupational History   Occupation: Employed    Employer: [employer]  Tobacco Use   Smoking status: Former    Packs/day: 1.00    Years: 12.00    Additional pack years: 0.00    Total pack years: 12.00    Types: Cigarettes     Quit date: 03/20/1976    Years since quitting: 46.4    Passive exposure: Past   Smokeless tobacco: Never  Vaping Use   Vaping Use: Never used  Substance and Sexual Activity   Alcohol use: No   Drug use: No   Sexual activity: Not Currently  Other Topics Concern   Not on file  Social History Narrative   Not on file   Social   Determinants of Health   Financial Resource Strain: Low Risk  (12/06/2018)   Overall Financial Resource Strain (CARDIA)    Difficulty of Paying Living Expenses: Not very hard  Food Insecurity: No Food Insecurity (12/06/2018)   Hunger Vital Sign    Worried About Running Out of Food in the Last Year: Never true    Ran Out of Food in the Last Year: Never true  Transportation Needs: No Transportation Needs (12/06/2018)   PRAPARE - Transportation    Lack of Transportation (Medical): No    Lack of Transportation (Non-Medical): No  Physical Activity: Inactive (12/06/2018)   Exercise Vital Sign    Days of Exercise per Week: 0 days    Minutes of Exercise per Session: 0 min  Stress: No Stress Concern Present (12/06/2018)   [employer] - Occupational Stress Questionnaire    Feeling of Stress : Only a little  Social Connections: Moderately Isolated (12/06/2018)   Social Connection and Isolation Panel [NHANES]    Frequency of Communication with Friends and Family: More than three times a week    Frequency of Social Gatherings with Friends and Family: Once a week    Attends Religious Services: More than 4 times per year    Active Member of Clubs or Organizations: No    Attends Club or Organization Meetings: Never    Marital Status: Never married    Review of systems General: negative for, night seats, fever, chills, weight loss +fatigue  Neck: Negative for lumps, goiter, pain and significant neck swelling Resp: Negative for cough, wheezing, dyspnea at rest +SOB with exertion  CV: Negative for chest pain, leg swelling, palpitations,  orthopnea GI: denies melena, hematochezia, nausea, vomiting, diarrhea, constipation, dysphagia, odyonophagia, or unintentional weight loss. +early satiety  MSK: Negative for joint pain or swelling, back pain, and muscle pain. Derm: Negative for itching or rash Psych: Denies depression, anxiety, memory loss, confusion. No homicidal or suicidal ideation.  Heme: Negative for prolonged bleeding, bruising easily, and swollen nodes. Endocrine: Negative for cold or heat intolerance, polyuria, polydipsia and goiter. Neuro: negative for tremor, gait imbalance, syncope and seizures. The remainder of the review of systems is noncontributory.  Physical Exam: BP (!) 96/54 (BP Location: Left Arm, Patient Position: Sitting, Cuff Size: Large)   Pulse (!) 125   Temp 98.1 F (36.7 C) (Oral)   Ht 5' 2" (1.575 m)   Wt 240 lb (108.9 kg)   SpO2 (!) 82%   BMI 43.90 kg/m  General:   Alert and oriented. No distress noted. Pleasant and cooperative.  Head:  Normocephalic and atraumatic. Eyes:  Conjuctiva clear without scleral icterus. Mouth:  Oral mucosa pink and moist. Good dentition. No lesions. Heart: Normal rate and rhythm, s1 and s2 heart sounds present.  Lungs: Clear lung sounds in all lobes. Respirations equal and unlabored. Abdomen:  +BS, soft, non-tender and non-distended. No rebound or guarding. No HSM or masses noted. Derm: No palmar erythema or jaundice Msk:  Symmetrical without gross deformities. Normal posture. Extremities:  Without edema. Neurologic:  Alert and  oriented x4 Psych:  Alert and cooperative. Normal mood and affect.  Invalid input(s): "6 MONTHS"   ASSESSMENT: Averee Shells is a 72 y.o. female presenting today as a new patient for positive occult stool cards and worsening anemia   Acute on chronic anemia/positive occult stool cards:  worsening anemia starting in September 2023, she has been maintained on iron pills for years since her gastric bypass, recent occult stools cards with    2/3 positive. No BRBPR, she has dark stools on iron so difficult to obtain if there is actual melena. She notes early satiety for the past few months as well. Given worsening anemia, positive occult stool cards and early satiety, would recommend proceeding with upper endoscopy and colonoscopy as I cannot rule out upper or lower GI bleeding source. Indications, risks and benefits of procedure discussed in detail with patient. Patient verbalized understanding and is in agreement to proceed with EGD/colonoscopy at this time.   Notably, patients Oxygen saturation was 82% on arrival, even after sitting for a few minutes. Oxygen was 86% on repeat at the end of our visit. Patient a/ox4 and in NAD at time oxygen levels were checked. She appears well overall, respirations equal and unlabored, she is able to carry on a conversation without issue. Discussed with patient concerns for hypoxia and worsening anemia (8.5 on 6/11), recommended patient proceed to the ER for symptomatic anemia as she may require more urgent endoscopic evaluation/transfusion, however, patient is declining at this time. I strongly encouraged patient to seek evaluation in the ER, however, she continued to decline.  PLAN:  Recommended to proceed to the ER for symptomatic anemia/hypoxia 2. EGD/Colonoscopy-ASA III (hold Eliquis x2 days)-will schedule with [facility] for sooner endo evaluation 3. Continue with PO iron 4. Continue omeprazole 20mg daily   All questions were answered, patient verbalized understanding and is in agreement with plan as outlined above.    Follow Up: 3 months   Meaghann Choo L. Donivin Wirt, MSN, APRN, AGNP-C Adult-Gerontology Nurse Practitioner [facility]  

## 2022-09-01 ENCOUNTER — Other Ambulatory Visit: Payer: Self-pay

## 2022-09-01 NOTE — Telephone Encounter (Signed)
Per Dr.K-OK to hold eliquis per your protocol.  Per protocol hold Eliquis x 2 days. Will call pt once pre op is received.    Pre op appt: 09/19/22 at 11:00am [facility]    Pt contacted and made aware. Will send pre op letter and instructions to hold eliquis

## 2022-09-01 NOTE — Telephone Encounter (Signed)
Per Dr.K-OK to hold eliquis per your protocol.  Per protocol hold Eliquis x 2 days. Will call pt once pre op is received.    Pre op appt: 09/19/22 at 11:00am [facility]    Pt contacted and made aware. Will send pre op letter and instructions to hold eliquis 

## 2022-09-02 DIAGNOSIS — R6881 Early satiety: Secondary | ICD-10-CM | POA: Insufficient documentation

## 2022-09-02 DIAGNOSIS — D509 Iron deficiency anemia, unspecified: Secondary | ICD-10-CM | POA: Insufficient documentation

## 2022-09-05 ENCOUNTER — Other Ambulatory Visit (HOSPITAL_COMMUNITY): Payer: Self-pay | Admitting: Neurosurgery

## 2022-09-05 DIAGNOSIS — D329 Benign neoplasm of meninges, unspecified: Secondary | ICD-10-CM

## 2022-09-15 NOTE — Patient Instructions (Signed)
Your procedure is scheduled on: 09/25/2022  Report to [facility] Main Entrance at  9:30   AM.  Call this number if you have problems the morning of surgery: [phone]   Remember:              Follow Directions on the letter you received from Your Physician's office regarding the Bowel Prep              No Smoking the day of Procedure :   Take these medicines the morning of surgery with A SIP OF WATER: carvedilol, gabapentin, and omeprazole  Hold eliquis 2 days prior to procedure as instructed by office   Do not wear jewelry, make-up or nail polish.    Do not bring valuables to the hospital.  Contacts, dentures or bridgework may not be worn into surgery.  .   Patients discharged the day of surgery will not be allowed to drive home.     Colonoscopy, Adult, Care After This sheet gives you information about how to care for yourself after your procedure. Your health care provider may also give you more specific instructions. If you have problems or questions, contact your health care provider. What can I expect after the procedure? After the procedure, it is common to have: A small amount of blood in your stool for 24 hours after the procedure. Some gas. Mild abdominal cramping or bloating.  Follow these instructions at home: General instructions  For the first 24 hours after the procedure: Do not drive or use machinery. Do not sign important documents. Do not drink alcohol. Do your regular daily activities at a slower pace than normal. Eat soft, easy-to-digest foods. Rest often. Take over-the-counter or prescription medicines only as told by your health care provider. It is up to you to get the results of your procedure. Ask your health care provider, or the department performing the procedure, when your results will be ready. Relieving cramping and bloating Try walking around when you have cramps or feel bloated. Apply heat to your abdomen as told by your health care  provider. Use a heat source that your health care provider recommends, such as a moist heat pack or a heating pad. Place a towel between your skin and the heat source. Leave the heat on for 20-30 minutes. Remove the heat if your skin turns bright red. This is especially important if you are unable to feel pain, heat, or cold. You may have a greater risk of getting burned. Eating and drinking Drink enough fluid to keep your urine clear or pale yellow. Resume your normal diet as instructed by your health care provider. Avoid heavy or fried foods that are hard to digest. Avoid drinking alcohol for as long as instructed by your health care provider. Contact a health care provider if: You have blood in your stool 2-3 days after the procedure. Get help right away if: You have more than a small spotting of blood in your stool. You pass large blood clots in your stool. Your abdomen is swollen. You have nausea or vomiting. You have a fever. You have increasing abdominal pain that is not relieved with medicine. This information is not intended to replace advice given to you by your health care provider. Make sure you discuss any questions you have with your health care provider. Document Released: 10/19/2003 Document Revised: 11/29/2015 Document Reviewed: 05/18/2015 Elsevier Interactive Patient Education  2018 Elsevier Inc. Upper Endoscopy, Adult, Care After After the procedure, it is common  to have a sore throat. It is also common to have: Mild stomach pain or discomfort. Bloating. Nausea. Follow these instructions at home: The instructions below may help you care for yourself at home. Your health care provider may give you more instructions. If you have questions, ask your health care provider. If you were given a sedative during the procedure, it can affect you for several hours. Do not drive or operate machinery until your health care provider says that it is safe. If you will be going home  right after the procedure, plan to have a responsible adult: Take you home from the hospital or clinic. You will not be allowed to drive. Care for you for the time you are told. Follow instructions from your health care provider about what you may eat and drink. Return to your normal activities as told by your health care provider. Ask your health care provider what activities are safe for you. Take over-the-counter and prescription medicines only as told by your health care provider. Contact a health care provider if you: Have a sore throat that lasts longer than one day. Have trouble swallowing. Have a fever. Get help right away if you: Vomit blood or your vomit looks like coffee grounds. Have bloody, black, or tarry stools. Have a very bad sore throat or you cannot swallow. Have difficulty breathing or very bad pain in your chest or abdomen. These symptoms may be an emergency. Get help right away. Call 911. Do not wait to see if the symptoms will go away. Do not drive yourself to the hospital. Summary After the procedure, it is common to have a sore throat, mild stomach discomfort, bloating, and nausea. If you were given a sedative during the procedure, it can affect you for several hours. Do not drive until your health care provider says that it is safe. Follow instructions from your health care provider about what you may eat and drink. Return to your normal activities as told by your health care provider. This information is not intended to replace advice given to you by your health care provider. Make sure you discuss any questions you have with your health care provider. Document Revised: 06/15/2021 Document Reviewed: 06/15/2021 Elsevier Patient Education  2024 [employer].

## 2022-09-17 NOTE — Progress Notes (Signed)
[facility] [address] [place] [address]    Clinic Day:  09/18/2022  Referring physician: Mirna Mires, MD  Patient Care Team: Mirna Mires, MD as PCP - General (Family Medicine) Jonelle Sidle, MD as PCP - Cardiology (Cardiology) Doreatha Massed, MD as Medical Oncologist (Medical Oncology)   ASSESSMENT & PLAN:   Assessment: 1.  Adenocarcinoma of left lung (HCC) -Chemoradiation therapy with carboplatin and paclitaxel from 01/07/2019 through 02/11/2019. -Consolidation immunotherapy with durvalumab from 03/19/2019 through 04/15/2018, held due to pneumonitis. -CT chest on 07/02/2019 shows left upper lobe lung mass measuring 2.6 x 2.0 cm.  It shows improvement in size. -MRI of the brain on 07/18/2019 showed left sphenoid wing meningioma measuring 2.6 x 2.0 x 2.6 cm unchanged.  No new enhancing intracranial lesion.  Increased left temporal white matter edema. -Durvalumab restarted on 07/24/2019. -She was evaluated by cardiology with a stress test.  EF was 46%.  She underwent cardiac catheterization which did not show any abnormalities. - 1 year of durvalumab completed on 07/23/2020. - Liver mass biopsy (08/08/2021): Adenocarcinoma, CK7 positive, negative for TTF-1, Napsin a, GATA3, ER, CK20, CDX2 - PET scan (08/25/2021): Right upper lobe lung nodule 9 mm, SUV 5.0.  Groundglass and solid nodule periphery of the right upper lobe 8 mm, SUV 2.45.  Right lobe liver mass 4.5 x 3.6 cm, SUV 7.78. - NGS testing: PD-L1 negative, TMB-low, MSI-stable, K-ras G12 D.  No other targetable mutations. - Carboplatin, pemetrexed and pembrolizumab 4 cycles from 09/22/2021 through 11/25/2021, followed by maintenance pemetrexed and pembrolizumab -  CT CAP (11/10/2021): Reduced size of liver mass and interval resolution of previously seen groundglass pulmonary nodules.  There is a new inflammatory right upper lobe nodule.    Plan: 1.  Metastatic adenocarcinoma of the lung to the liver and  right lung: - CT CAP (08/07/2022): Stable postradiation change in the perihilar left lung and peripherally enhancing lesions in the right lobe of the liver. - She is tolerating pembrolizumab and pemetrexed reasonably well. - No immunotherapy related side effects.  She has dyspnea on exertion, likely from anemia. - Reviewed labs today: Normal LFTs.  Albumin is trending down.  Latest albumin level is 2.4.  CBC shows hemoglobin 8.4. - Recommend checking urine for protein. - Proceed with treatment today and in 3 weeks.  Pemetrexed is 20% dose reduced.  RTC 6 weeks for follow-up.  Will plan to repeat scan in 4 months from the last.    2.  Meningioma: - MRI of the brain on 04/03/2022: Stable sphenoid wing meningioma on the left with decreasing vasogenic edema.  3.  Unprovoked pulmonary embolism: - Continue Eliquis 5 mg twice daily indefinitely.   4.  Leg swellings: - Continue Lasix 20 mg along with potassium 20 mill colons daily.   5.  Normocytic anemia: - Recent worsening in her hemoglobin, likely causing worsening shortness of breath on exertion. - Stool cards 2/3 were positive.  She has a colonoscopy scheduled on 09/25/2022. - Iron panel today shows ferritin 596, previously 310 in March.  Percent saturation is 15.  No indication for parenteral iron therapy.    Orders Placed This Encounter  Procedures   Urinalysis, dipstick only   Iron and TIBC ([facility] ONLY)   Ferritin   Magnesium    Standing Status:   Future    Standing Expiration Date:   01/01/2024   CBC with Differential    Standing Status:   Future    Standing Expiration  Date:   01/01/2024   Comprehensive metabolic panel    Standing Status:   Future    Standing Expiration Date:   01/01/2024   Magnesium    Standing Status:   Future    Standing Expiration Date:   01/22/2024   CBC with Differential    Standing Status:   Future    Standing Expiration Date:   01/22/2024   Comprehensive metabolic panel     Standing Status:   Future    Standing Expiration Date:   01/22/2024   Magnesium    Standing Status:   Future    Standing Expiration Date:   02/12/2024   CBC with Differential    Standing Status:   Future    Standing Expiration Date:   02/12/2024   Comprehensive metabolic panel    Standing Status:   Future    Standing Expiration Date:   02/12/2024   T4    Standing Status:   Future    Standing Expiration Date:   02/12/2024   TSH    Standing Status:   Future    Standing Expiration Date:   02/12/2024   Magnesium    Standing Status:   Future    Standing Expiration Date:   03/04/2024   CBC with Differential    Standing Status:   Future    Standing Expiration Date:   03/04/2024   Comprehensive metabolic panel    Standing Status:   Future    Standing Expiration Date:   03/04/2024      I,Katie Daubenspeck,acting as a scribe for Doreatha Massed, MD.,have documented all relevant documentation on the behalf of Doreatha Massed, MD,as directed by  Doreatha Massed, MD while in the presence of Doreatha Massed, MD.   I, Doreatha Massed MD, have reviewed the above documentation for accuracy and completeness, and I agree with the above.   Doreatha Massed, MD   7/1/202412:39 PM  CHIEF COMPLAINT:   Diagnosis: stage IV adenocarcinoma of the left lung, negative for targetable mutations    Cancer Staging  Adenocarcinoma of left lung [facility]) Staging form: Lung, AJCC 8th Edition - Clinical stage from 01/02/2019: Stage IIIB (cT3, cN2, cM0) - Signed by Doreatha Massed, MD on 01/02/2019 - Pathologic stage from 09/07/2021: Stage IVA (pTX, pNX, pM1b) - Unsigned    Prior Therapy: 1. Chemoradiation with carboplatin and paclitaxel from 01/07/2019 to 02/11/2019. 2. Consolidation with durvalumab from 03/19/2019 to 04/16/2019, held due to pneumonitis.  Current Therapy:  Pemetrexed and pembrolizumab maintenance    HISTORY OF PRESENT ILLNESS:   Oncology History   Adenocarcinoma of left lung (HCC)  12/09/2018 Initial Diagnosis   Adenocarcinoma of left lung (HCC)   01/02/2019 Cancer Staging   Staging form: Lung, AJCC 8th Edition - Clinical stage from 01/02/2019: Stage IIIB (cT3, cN2, cM0) - Signed by Doreatha Massed, MD on 01/02/2019   01/07/2019 - 02/11/2019 Chemotherapy   The patient had palonosetron (ALOXI) injection 0.25 mg, 0.25 mg, Intravenous,  Once, 6 of 6 cycles Administration: 0.25 mg (01/07/2019), 0.25 mg (01/14/2019), 0.25 mg (01/21/2019), 0.25 mg (01/28/2019), 0.25 mg (02/04/2019), 0.25 mg (02/11/2019) CARBOplatin (PARAPLATIN) 270 mg in sodium chloride 0.9 % 250 mL chemo infusion, 270 mg (100 % of original dose 266.4 mg), Intravenous,  Once, 6 of 6 cycles Dose modification:   (original dose 266.4 mg, Cycle 1),   (original dose 266.4 mg, Cycle 2),   (original dose 266.4 mg, Cycle 3),   (original dose 266.4 mg, Cycle 4) Administration: 270 mg (01/07/2019), 270 mg (  01/14/2019), 270 mg (01/21/2019), 270 mg (01/28/2019), 270 mg (02/04/2019), 270 mg (02/11/2019) PACLitaxel (TAXOL) 108 mg in sodium chloride 0.9 % 250 mL chemo infusion (</= 80mg /m2), 45 mg/m2 = 108 mg, Intravenous,  Once, 6 of 6 cycles Administration: 108 mg (01/07/2019), 108 mg (01/14/2019), 108 mg (01/21/2019), 108 mg (01/28/2019), 108 mg (02/04/2019), 108 mg (02/11/2019) fosaprepitant (EMEND) 150 mg, dexamethasone (DECADRON) 12 mg in sodium chloride 0.9 % 145 mL IVPB, , Intravenous,  Once, 5 of 5 cycles Administration:  (01/14/2019),  (01/21/2019),  (01/28/2019),  (02/04/2019),  (02/11/2019)  for chemotherapy treatment.    03/19/2019 - 07/23/2020 Chemotherapy   Patient is on Treatment Plan : LUNG DURVALUMAB Q14D     09/22/2021 - 11/04/2021 Chemotherapy   Patient is on Treatment Plan : LUNG Carboplatin (5) + Pemetrexed (500) + Pembrolizumab (200) D1 q21d Induction x 4 cycles / Maintenance Pemetrexed (500) + Pembrolizumab (200) D1 q21d     09/22/2021 -  Chemotherapy   Patient is on  Treatment Plan : LUNG Carboplatin (5) + Pemetrexed (500) + Pembrolizumab (200) D1 q21d Induction x 4 cycles / Maintenance Pemetrexed (500) + Pembrolizumab (200) D1 q21d        INTERVAL HISTORY:   Denise [name] is a 72 y.o. female presenting to clinic today for follow up of stage IV adenocarcinoma of the left lung. She was last seen by me on 08/08/22.  Today, she states that she is doing well overall. Her appetite level is at 100%. Her energy level is at 10%.  PAST MEDICAL HISTORY:   Past Medical History: Past Medical History:  Diagnosis Date   Anemia    Aortic stenosis    Arthritis    Coronary artery calcification seen on CT scan    Essential hypertension    GERD (gastroesophageal reflux disease)    History of lung cancer    Stage III adenocarcinoma status post chemoradiation   Port-A-Cath in place 01/06/2019   Type 2 diabetes mellitus (HCC)     Surgical History: Past Surgical History:  Procedure Laterality Date   CHOLECYSTECTOMY  1997   COLONOSCOPY     GASTRIC BYPASS     INCISIONAL HERNIA REPAIR  04/11/11   IR IMAGING GUIDED PORT INSERTION  12/27/2018   Right   IR US GUIDE BX ASP/DRAIN  08/08/2021   LAPAROSCOPIC SALPINGOOPHERECTOMY     LAPAROTOMY  04/11/2011   Procedure: EXPLORATORY LAPAROTOMY;  Surgeon: Clovis Pu. Cornett, MD;  Location: [facility];  Service: General;  Laterality: N/A;  closure port hole   RIGHT/LEFT HEART CATH AND CORONARY ANGIOGRAPHY N/A 12/29/2019   Procedure: RIGHT/LEFT HEART CATH AND CORONARY ANGIOGRAPHY;  Surgeon: Lennette Bihari, MD;  Location: [facility]AS[facility];  Service: Cardiovascular;  Laterality: N/A;   TOTAL HIP ARTHROPLASTY  03/07/2012   Procedure: TOTAL HIP ARTHROPLASTY ANTERIOR APPROACH;  Surgeon: Shelda Pal, MD;  Location: [facility];  Service: Orthopedics;  Laterality: Right;   TOTAL SHOULDER ARTHROPLASTY Left 01/21/2015   TOTAL SHOULDER ARTHROPLASTY Left 01/21/2015   Procedure: LEFT TOTAL SHOULDER ARTHROPLASTY;  Surgeon: Francena Hanly, MD;  Location:  [facility] OR;  Service: Orthopedics;  Laterality: Left;   VAGINAL HYSTERECTOMY      Social History: Social History   Socioeconomic History   Marital status: Single    Spouse name: Not on file   Number of children: Not on file   Years of education: 12th grade   Highest education level: Not on file  Occupational History   Occupation: Employed    Employer: [employer]  Tobacco Use   Smoking status: Former    Packs/day: 1.00    Years: 12.00    Additional pack years: 0.00    Total pack years: 12.00    Types: Cigarettes    Quit date: 03/20/1976    Years since quitting: 46.5    Passive exposure: Past   Smokeless tobacco: Never  Vaping Use   Vaping Use: Never used  Substance and Sexual Activity   Alcohol use: No   Drug use: No   Sexual activity: Not Currently  Other Topics Concern   Not on file  Social History Narrative   Not on file   Social Determinants of Health   Financial Resource Strain: Low Risk  (12/06/2018)   Overall Financial Resource Strain (CARDIA)    Difficulty of Paying Living Expenses: Not very hard  Food Insecurity: No Food Insecurity (12/06/2018)   Hunger Vital Sign    Worried About Running Out of Food in the Last Year: Never true    Ran Out of Food in the Last Year: Never true  Transportation Needs: No Transportation Needs (12/06/2018)   PRAPARE - Administrator, Civil Service (Medical): No    Lack of Transportation (Non-Medical): No  Physical Activity: Inactive (12/06/2018)   Exercise Vital Sign    Days of Exercise per Week: 0 days    Minutes of Exercise per Session: 0 min  Stress: No Stress Concern Present (12/06/2018)   [employer] of Occupational Health - Occupational Stress Questionnaire    Feeling of Stress : Only a little  Social Connections: Moderately Isolated (12/06/2018)   Social Connection and Isolation Panel [NHANES]    Frequency of Communication with Friends and Family: More than three times a week    Frequency of  Social Gatherings with Friends and Family: Once a week    Attends Religious Services: More than 4 times per year    Active Member of [employer] or Organizations: No    Attends Banker Meetings: Never    Marital Status: Never married  Intimate Partner Violence: Not At Risk (12/06/2018)   Humiliation, Afraid, Rape, and Kick questionnaire    Fear of Current or Ex-Partner: No    Emotionally Abused: No    Physically Abused: No    Sexually Abused: No    Family History: Family History  Problem Relation Age of Onset   Breast cancer Mother    COPD Mother    Arthritis Mother    Diabetes Mother    Hypertension Mother    Hypertension Father    Diabetes Father    Breast cancer Sister    Thyroid cancer Brother    Huntington's disease Maternal Grandmother    Heart attack Brother     Current Medications:  Current Outpatient Medications:    Calcium Carbonate Antacid (TUMS PO), Take 4 tablets by mouth daily., Disp: , Rfl:    Cyanocobalamin (VITAMIN B-12) 2500 MCG SUBL, Place 2,500 mcg under the tongue every morning. , Disp: , Rfl:    diphenhydrAMINE (BENADRYL) 25 MG tablet, Take 25 mg by mouth every 6 (six) hours as needed for itching or allergies., Disp: , Rfl:    fluticasone (FLONASE) 50 MCG/ACT nasal spray, Place 1 spray into both nostrils 2 (two) times daily., Disp: 16 g, Rfl: 2   folic acid (FOLVITE) 1 MG tablet, TAKE 1 TABLET BY MOUTH EVERY DAY, Disp: 90 tablet, Rfl: 1   furosemide (LASIX) 20 MG tablet, Take 40 mg by mouth daily.,  Disp: , Rfl:    gabapentin (NEURONTIN) 300 MG capsule, Take 300 mg by mouth at bedtime., Disp: , Rfl:    Multiple Vitamin (MULITIVITAMIN WITH MINERALS) TABS, Take 1 tablet by mouth daily., Disp: , Rfl:    omeprazole (PRILOSEC) 20 MG capsule, Take 20 mg by mouth daily., Disp: , Rfl:    Potassium Chloride ER 20 MEQ TBCR, Take 40 mEq by mouth daily., Disp: , Rfl:    carvedilol (COREG) 3.125 MG tablet, TAKE 1 TABLET BY MOUTH 2 TIMES DAILY., Disp: 180  tablet, Rfl: 1   ELIQUIS 5 MG TABS tablet, TAKE 1 TABLET BY MOUTH TWICE A DAY, Disp: 60 tablet, Rfl: 6   ferrous sulfate 325 (65 FE) MG tablet, Take 325 mg by mouth every evening., Disp: , Rfl:  No current facility-administered medications for this visit.  Facility-Administered Medications Ordered in Other Visits:    heparin lock flush 100 unit/mL, 500 Units, Intracatheter, Once PRN, Doreatha Massed, MD   magnesium sulfate 2 GM/50ML IVPB, , , ,    pembrolizumab (KEYTRUDA) 200 mg in sodium chloride 0.9 % 50 mL chemo infusion, 200 mg, Intravenous, Once, Doreatha Massed, MD, Last Rate: 116 mL/hr at 09/18/22 1228, 200 mg at 09/18/22 1228   PEMEtrexed (ALIMTA) 900 mg in sodium chloride 0.9 % 100 mL chemo infusion, 400 mg/m2 (Treatment Plan Recorded), Intravenous, Once, Doreatha Massed, MD   sodium chloride flush (NS) 0.9 % injection 10 mL, 10 mL, Intracatheter, PRN, Doreatha Massed, MD   Allergies: No Known Allergies  REVIEW OF SYSTEMS:   Review of Systems  Constitutional:  Negative for chills, fatigue and fever.  HENT:   Negative for lump/mass, mouth sores, nosebleeds, sore throat and trouble swallowing.   Eyes:  Negative for eye problems.  Respiratory:  Positive for cough and shortness of breath.   Cardiovascular:  Negative for chest pain, leg swelling and palpitations.  Gastrointestinal:  Negative for abdominal pain, constipation, diarrhea, nausea and vomiting.  Genitourinary:  Negative for bladder incontinence, difficulty urinating, dysuria, frequency, hematuria and nocturia.   Musculoskeletal:  Negative for arthralgias, back pain, flank pain, myalgias and neck pain.  Skin:  Negative for itching and rash.  Neurological:  Positive for numbness. Negative for dizziness and headaches.  Hematological:  Does not bruise/bleed easily.  Psychiatric/Behavioral:  Negative for depression, sleep disturbance and suicidal ideas. The patient is not nervous/anxious.   All other  systems reviewed and are negative.    VITALS:   There were no vitals taken for this visit.  Wt Readings from Last 3 Encounters:  09/18/22 229 lb 3.2 oz (104 kg)  08/31/22 240 lb (108.9 kg)  08/29/22 238 lb 3.2 oz (108 kg)    There is no height or weight on file to calculate BMI.  Performance status (ECOG): 1 - Symptomatic but completely ambulatory  PHYSICAL EXAM:   Physical Exam Vitals and nursing note reviewed. Exam conducted with a chaperone present.  Constitutional:      Appearance: Normal appearance.  Cardiovascular:     Rate and Rhythm: Normal rate and regular rhythm.     Pulses: Normal pulses.     Heart sounds: Normal heart sounds.  Pulmonary:     Effort: Pulmonary effort is normal.     Breath sounds: Normal breath sounds.  Abdominal:     Palpations: Abdomen is soft. There is no hepatomegaly, splenomegaly or mass.     Tenderness: There is no abdominal tenderness.  Musculoskeletal:     Right lower leg: No edema.  Left lower leg: No edema.  Lymphadenopathy:     Cervical: No cervical adenopathy.     Right cervical: No superficial, deep or posterior cervical adenopathy.    Left cervical: No superficial, deep or posterior cervical adenopathy.     Upper Body:     Right upper body: No supraclavicular or axillary adenopathy.     Left upper body: No supraclavicular or axillary adenopathy.  Neurological:     General: No focal deficit present.     Mental Status: She is alert and oriented to person, place, and time.  Psychiatric:        Mood and Affect: Mood normal.        Behavior: Behavior normal.     LABS:      Latest Ref Rng & Units 09/18/2022   10:52 AM 08/29/2022   12:27 PM 08/08/2022   12:04 PM  CBC  WBC 4.0 - 10.5 K/uL 5.6  6.2  4.9   Hemoglobin 12.0 - 15.0 g/dL 8.4  8.5  9.2   Hematocrit 36.0 - 46.0 % 28.5  28.5  30.9   Platelets 150 - 400 K/uL 527  475  439       Latest Ref Rng & Units 09/18/2022   10:52 AM 08/29/2022   12:27 PM 08/08/2022   12:04 PM   CMP  Glucose 70 - 99 mg/dL 161  096  96   BUN 8 - 23 mg/dL 9  8  7    Creatinine 0.44 - 1.00 mg/dL 0.45  4.09  8.11   Sodium 135 - 145 mmol/L 139  138  138   Potassium 3.5 - 5.1 mmol/L 3.7  3.7  4.1   Chloride 98 - 111 mmol/L 105  107  107   CO2 22 - 32 mmol/L 23  25  24    Calcium 8.9 - 10.3 mg/dL 8.5  8.2  8.4   Total Protein 6.5 - 8.1 g/dL 6.5  6.2  6.1   Total Bilirubin 0.3 - 1.2 mg/dL 0.3  0.5  0.5   Alkaline Phos 38 - 126 U/L 92  85  88   AST 15 - 41 U/L 20  18  18    ALT 0 - 44 U/L 12  13  14       No results found for: "CEA1", "CEA" / No results found for: "CEA1", "CEA" No results found for: "PSA1" No results found for: "BJY782" No results found for: "CAN125"  No results found for: "TOTALPROTELP", "ALBUMINELP", "A1GS", "A2GS", "BETS", "BETA2SER", "GAMS", "MSPIKE", "SPEI" Lab Results  Component Value Date   TIBC 215 (L) 09/18/2022   TIBC 227 (L) 06/05/2022   FERRITIN 596 (H) 09/18/2022   FERRITIN 310 (H) 06/05/2022   IRONPCTSAT 15 09/18/2022   IRONPCTSAT 17 06/05/2022   Lab Results  Component Value Date   LDH 237 (H) 07/17/2022     STUDIES:   No results found.

## 2022-09-18 ENCOUNTER — Inpatient Hospital Stay: Payer: Medicare HMO | Attending: Neurosurgery

## 2022-09-18 ENCOUNTER — Inpatient Hospital Stay: Payer: Medicare HMO

## 2022-09-18 ENCOUNTER — Inpatient Hospital Stay (HOSPITAL_BASED_OUTPATIENT_CLINIC_OR_DEPARTMENT_OTHER): Payer: Medicare HMO | Admitting: Hematology

## 2022-09-18 VITALS — BP 91/54 | HR 112 | Temp 96.8°F | Resp 16

## 2022-09-18 DIAGNOSIS — E8809 Other disorders of plasma-protein metabolism, not elsewhere classified: Secondary | ICD-10-CM

## 2022-09-18 DIAGNOSIS — Z452 Encounter for adjustment and management of vascular access device: Secondary | ICD-10-CM | POA: Insufficient documentation

## 2022-09-18 DIAGNOSIS — C3492 Malignant neoplasm of unspecified part of left bronchus or lung: Secondary | ICD-10-CM

## 2022-09-18 DIAGNOSIS — D329 Benign neoplasm of meninges, unspecified: Secondary | ICD-10-CM | POA: Insufficient documentation

## 2022-09-18 DIAGNOSIS — I2699 Other pulmonary embolism without acute cor pulmonale: Secondary | ICD-10-CM | POA: Insufficient documentation

## 2022-09-18 DIAGNOSIS — Z5112 Encounter for antineoplastic immunotherapy: Secondary | ICD-10-CM | POA: Insufficient documentation

## 2022-09-18 DIAGNOSIS — C7801 Secondary malignant neoplasm of right lung: Secondary | ICD-10-CM | POA: Insufficient documentation

## 2022-09-18 DIAGNOSIS — Z5111 Encounter for antineoplastic chemotherapy: Secondary | ICD-10-CM | POA: Diagnosis not present

## 2022-09-18 DIAGNOSIS — C3412 Malignant neoplasm of upper lobe, left bronchus or lung: Secondary | ICD-10-CM | POA: Diagnosis not present

## 2022-09-18 DIAGNOSIS — Z7901 Long term (current) use of anticoagulants: Secondary | ICD-10-CM | POA: Insufficient documentation

## 2022-09-18 DIAGNOSIS — C787 Secondary malignant neoplasm of liver and intrahepatic bile duct: Secondary | ICD-10-CM | POA: Diagnosis not present

## 2022-09-18 DIAGNOSIS — D649 Anemia, unspecified: Secondary | ICD-10-CM | POA: Diagnosis not present

## 2022-09-18 DIAGNOSIS — D508 Other iron deficiency anemias: Secondary | ICD-10-CM

## 2022-09-18 LAB — URINALYSIS, DIPSTICK ONLY
Glucose, UA: NEGATIVE mg/dL
Hgb urine dipstick: NEGATIVE
Ketones, ur: NEGATIVE mg/dL
Leukocytes,Ua: NEGATIVE
Nitrite: NEGATIVE
Protein, ur: 30 mg/dL — AB
Specific Gravity, Urine: 1.029 (ref 1.005–1.030)
pH: 5 (ref 5.0–8.0)

## 2022-09-18 LAB — COMPREHENSIVE METABOLIC PANEL
ALT: 12 U/L (ref 0–44)
AST: 20 U/L (ref 15–41)
Albumin: 2.4 g/dL — ABNORMAL LOW (ref 3.5–5.0)
Alkaline Phosphatase: 92 U/L (ref 38–126)
Anion gap: 11 (ref 5–15)
BUN: 9 mg/dL (ref 8–23)
CO2: 23 mmol/L (ref 22–32)
Calcium: 8.5 mg/dL — ABNORMAL LOW (ref 8.9–10.3)
Chloride: 105 mmol/L (ref 98–111)
Creatinine, Ser: 0.89 mg/dL (ref 0.44–1.00)
GFR, Estimated: >60 mL/min (ref >60)
Glucose, Bld: 106 mg/dL — ABNORMAL HIGH (ref 70–99)
Potassium: 3.7 mmol/L (ref 3.5–5.1)
Sodium: 139 mmol/L (ref 135–145)
Total Bilirubin: 0.3 mg/dL (ref 0.3–1.2)
Total Protein: 6.5 g/dL (ref 6.5–8.1)

## 2022-09-18 LAB — CBC WITH DIFFERENTIAL/PLATELET
Abs Immature Granulocytes: 0.02 10*3/uL (ref 0.00–0.07)
Basophils Absolute: 0 10*3/uL (ref 0.0–0.1)
Basophils Relative: 1 %
Eosinophils Absolute: 0.1 10*3/uL (ref 0.0–0.5)
Eosinophils Relative: 1 %
HCT: 28.5 % — ABNORMAL LOW (ref 36.0–46.0)
Hemoglobin: 8.4 g/dL — ABNORMAL LOW (ref 12.0–15.0)
Immature Granulocytes: 0 %
Lymphocytes Relative: 16 %
Lymphs Abs: 0.9 10*3/uL (ref 0.7–4.0)
MCH: 28.4 pg (ref 26.0–34.0)
MCHC: 29.5 g/dL — ABNORMAL LOW (ref 30.0–36.0)
MCV: 96.3 fL (ref 80.0–100.0)
Monocytes Absolute: 0.6 10*3/uL (ref 0.1–1.0)
Monocytes Relative: 11 %
Neutro Abs: 4 10*3/uL (ref 1.7–7.7)
Neutrophils Relative %: 71 %
Platelets: 527 10*3/uL — ABNORMAL HIGH (ref 150–400)
RBC: 2.96 MIL/uL — ABNORMAL LOW (ref 3.87–5.11)
RDW: 17 % — ABNORMAL HIGH (ref 11.5–15.5)
WBC: 5.6 10*3/uL (ref 4.0–10.5)
nRBC: 0 % (ref 0.0–0.2)

## 2022-09-18 LAB — IRON AND TIBC
Iron: 32 ug/dL (ref 28–170)
Saturation Ratios: 15 % (ref 10.4–31.8)
TIBC: 215 ug/dL — ABNORMAL LOW (ref 250–450)
UIBC: 183 ug/dL

## 2022-09-18 LAB — MAGNESIUM: Magnesium: 1.8 mg/dL (ref 1.7–2.4)

## 2022-09-18 LAB — FERRITIN: Ferritin: 596 ng/mL — ABNORMAL HIGH (ref 11–307)

## 2022-09-18 MED ORDER — SODIUM CHLORIDE 0.9 % IV SOLN
400.0000 mg/m2 | Freq: Once | INTRAVENOUS | Status: AC
  Administered 2022-09-18: 900 mg via INTRAVENOUS
  Filled 2022-09-18: qty 20

## 2022-09-18 MED ORDER — SODIUM CHLORIDE 0.9 % IV SOLN
200.0000 mg | Freq: Once | INTRAVENOUS | Status: AC
  Administered 2022-09-18: 200 mg via INTRAVENOUS
  Filled 2022-09-18: qty 8

## 2022-09-18 MED ORDER — HEPARIN SOD (PORK) LOCK FLUSH 100 UNIT/ML IV SOLN
500.0000 [IU] | Freq: Once | INTRAVENOUS | Status: AC | PRN
  Administered 2022-09-18: 500 [IU]

## 2022-09-18 MED ORDER — SODIUM CHLORIDE 0.9% FLUSH
10.0000 mL | INTRAVENOUS | Status: DC | PRN
  Administered 2022-09-18: 10 mL

## 2022-09-18 MED ORDER — ONDANSETRON HCL 4 MG/2ML IJ SOLN
8.0000 mg | Freq: Once | INTRAMUSCULAR | Status: AC
  Administered 2022-09-18: 8 mg via INTRAVENOUS
  Filled 2022-09-18: qty 4

## 2022-09-18 MED ORDER — SODIUM CHLORIDE 0.9 % IV SOLN: Freq: Once | INTRAVENOUS | Status: AC

## 2022-09-18 NOTE — Patient Instructions (Signed)
[facility]  Discharge Instructions: Thank you for choosing [facility] to provide your oncology and hematology care.  If you have a lab appointment with the Cancer Center - please note that after April 8th, 2024, all labs will be drawn in the cancer center.  You do not have to check in or register with the main entrance as you have in the past but will complete your check-in in the cancer center.  Wear comfortable clothing and clothing appropriate for easy access to any Portacath or PICC line.   We strive to give you quality time with your provider. You may need to reschedule your appointment if you arrive late (15 or more minutes).  Arriving late affects you and other patients whose appointments are after yours.  Also, if you miss three or more appointments without notifying the office, you may be dismissed from the clinic at the provider's discretion.      For prescription refill requests, have your pharmacy contact our office and allow 72 hours for refills to be completed.    Today you received the following chemotherapy and/or immunotherapy agents Keytruda/Alimta.  Pembrolizumab Injection What is this medication? PEMBROLIZUMAB (PEM broe LIZ ue mab) treats some types of cancer. It works by helping your immune system slow or stop the spread of cancer cells. It is a monoclonal antibody. This medicine may be used for other purposes; ask your health care provider or pharmacist if you have questions. COMMON BRAND NAME(S): Keytruda What should I tell my care team before I take this medication? They need to know if you have any of these conditions: Allogeneic stem cell transplant (uses someone else's stem cells) Autoimmune diseases, such as Crohn disease, ulcerative colitis, lupus History of chest radiation Nervous system problems, such as Guillain-Barre syndrome, myasthenia gravis Organ transplant An unusual or allergic reaction to pembrolizumab, other  medications, foods, dyes, or preservatives Pregnant or trying to get pregnant Breast-feeding How should I use this medication? This medication is injected into a vein. It is given by your care team in a hospital or clinic setting. A special MedGuide will be given to you before each treatment. Be sure to read this information carefully each time. Talk to your care team about the use of this medication in children. While it may be prescribed for children as young as 6 months for selected conditions, precautions do apply. Overdosage: If you think you have taken too much of this medicine contact a poison control center or emergency room at once. NOTE: This medicine is only for you. Do not share this medicine with others. What if I miss a dose? Keep appointments for follow-up doses. It is important not to miss your dose. Call your care team if you are unable to keep an appointment. What may interact with this medication? Interactions have not been studied. This list may not describe all possible interactions. Give your health care provider a list of all the medicines, herbs, non-prescription drugs, or dietary supplements you use. Also tell them if you smoke, drink alcohol, or use illegal drugs. Some items may interact with your medicine. What should I watch for while using this medication? Your condition will be monitored carefully while you are receiving this medication. You may need blood work while taking this medication. This medication may cause serious skin reactions. They can happen weeks to months after starting the medication. Contact your care team right away if you notice fevers or flu-like symptoms with a rash. The   rash may be red or purple and then turn into blisters or peeling of the skin. You may also notice a red rash with swelling of the face, lips, or lymph nodes in your neck or under your arms. Tell your care team right away if you have any change in your eyesight. Talk to your care  team if you may be pregnant. Serious birth defects can occur if you take this medication during pregnancy and for 4 months after the last dose. You will need a negative pregnancy test before starting this medication. Contraception is recommended while taking this medication and for 4 months after the last dose. Your care team can help you find the option that works for you. Do not breastfeed while taking this medication and for 4 months after the last dose. What side effects may I notice from receiving this medication? Side effects that you should report to your care team as soon as possible: Allergic reactions--skin rash, itching, hives, swelling of the face, lips, tongue, or throat Dry cough, shortness of breath or trouble breathing Eye pain, redness, irritation, or discharge with blurry or decreased vision Heart muscle inflammation--unusual weakness or fatigue, shortness of breath, chest pain, fast or irregular heartbeat, dizziness, swelling of the ankles, feet, or hands Hormone gland problems--headache, sensitivity to light, unusual weakness or fatigue, dizziness, fast or irregular heartbeat, increased sensitivity to cold or heat, excessive sweating, constipation, hair loss, increased thirst or amount of urine, tremors or shaking, irritability Infusion reactions--chest pain, shortness of breath or trouble breathing, feeling faint or lightheaded Kidney injury (glomerulonephritis)--decrease in the amount of urine, red or dark Dalaney Needle urine, foamy or bubbly urine, swelling of the ankles, hands, or feet Liver injury--right upper belly pain, loss of appetite, nausea, light-colored stool, dark yellow or Jacoby Ritsema urine, yellowing skin or eyes, unusual weakness or fatigue Pain, tingling, or numbness in the hands or feet, muscle weakness, change in vision, confusion or trouble speaking, loss of balance or coordination, trouble walking, seizures Rash, fever, and swollen lymph nodes Redness, blistering, peeling,  or loosening of the skin, including inside the mouth Sudden or severe stomach pain, bloody diarrhea, fever, nausea, vomiting Side effects that usually do not require medical attention (report to your care team if they continue or are bothersome): Bone, joint, or muscle pain Diarrhea Fatigue Loss of appetite Nausea Skin rash This list may not describe all possible side effects. Call your doctor for medical advice about side effects. You may report side effects to FDA at 1-800-FDA-1088. Where should I keep my medication? This medication is given in a hospital or clinic. It will not be stored at home. NOTE: This sheet is a summary. It may not cover all possible information. If you have questions about this medicine, talk to your doctor, pharmacist, or health care provider.  2024 Elsevier/Gold Standard (2021-07-19 00:00:00)    Pemetrexed Injection What is this medication? PEMETREXED (PEM e TREX ed) treats some types of cancer. It works by slowing down the growth of cancer cells. This medicine may be used for other purposes; ask your health care provider or pharmacist if you have questions. COMMON BRAND NAME(S): Alimta, PEMFEXY What should I tell my care team before I take this medication? They need to know if you have any of these conditions: Infection, such as chickenpox, cold sores, or herpes Kidney disease Low blood cell levels (white cells, red cells, and platelets) Lung or breathing disease, such as asthma Radiation therapy An unusual or allergic reaction to pemetrexed, other medications,   foods, dyes, or preservatives If you or your partner are pregnant or trying to get pregnant Breast-feeding How should I use this medication? This medication is injected into a vein. It is given by your care team in a hospital or clinic setting. Talk to your care team about the use of this medication in children. Special care may be needed. Overdosage: If you think you have taken too much of this  medicine contact a poison control center or emergency room at once. NOTE: This medicine is only for you. Do not share this medicine with others. What if I miss a dose? Keep appointments for follow-up doses. It is important not to miss your dose. Call your care team if you are unable to keep an appointment. What may interact with this medication? Do not take this medication with any of the following: Live virus vaccines This medication may also interact with the following: Ibuprofen This list may not describe all possible interactions. Give your health care provider a list of all the medicines, herbs, non-prescription drugs, or dietary supplements you use. Also tell them if you smoke, drink alcohol, or use illegal drugs. Some items may interact with your medicine. What should I watch for while using this medication? Your condition will be monitored carefully while you are receiving this medication. This medication may make you feel generally unwell. This is not uncommon as chemotherapy can affect healthy cells as well as cancer cells. Report any side effects. Continue your course of treatment even though you feel ill unless your care team tells you to stop. This medication can cause serious side effects. To reduce the risk, your care team may give you other medications to take before receiving this one. Be sure to follow the directions from your care team. This medication can cause a rash or redness in areas of the body that have previously had radiation therapy. If you have had radiation therapy, tell your care team if you notice a rash in this area. This medication may increase your risk of getting an infection. Call your care team for advice if you get a fever, chills, sore throat, or other symptoms of a cold or flu. Do not treat yourself. Try to avoid being around people who are sick. Be careful brushing or flossing your teeth or using a toothpick because you may get an infection or bleed more  easily. If you have any dental work done, tell your dentist you are receiving this medication. Avoid taking medications that contain aspirin, acetaminophen, ibuprofen, naproxen, or ketoprofen unless instructed by your care team. These medications may hide a fever. Check with your care team if you have severe diarrhea, nausea, and vomiting, or if you sweat a lot. The loss of too much body fluid may make it dangerous for you to take this medication. Talk to your care team if you or your partner wish to become pregnant or think either of you might be pregnant. This medication can cause serious birth defects if taken during pregnancy and for 6 months after the last dose. A negative pregnancy test is required before starting this medication. A reliable form of contraception is recommended while taking this medication and for 6 months after the last dose. Talk to your care team about reliable forms of contraception. Do not father a child while taking this medication and for 3 months after the last dose. Use a condom while having sex during this time period. Do not breastfeed while taking this medication and for 1 week   after the last dose. This medication may cause infertility. Talk to your care team if you are concerned about your fertility. What side effects may I notice from receiving this medication? Side effects that you should report to your care team as soon as possible: Allergic reactions--skin rash, itching, hives, swelling of the face, lips, tongue, or throat Dry cough, shortness of breath or trouble breathing Infection--fever, chills, cough, sore throat, wounds that don't heal, pain or trouble when passing urine, general feeling of discomfort or being unwell Kidney injury--decrease in the amount of urine, swelling of the ankles, hands, or feet Low red blood cell level--unusual weakness or fatigue, dizziness, headache, trouble breathing Redness, blistering, peeling, or loosening of the skin,  including inside the mouth Unusual bruising or bleeding Side effects that usually do not require medical attention (report to your care team if they continue or are bothersome): Fatigue Loss of appetite Nausea Vomiting This list may not describe all possible side effects. Call your doctor for medical advice about side effects. You may report side effects to FDA at 1-800-FDA-1088. Where should I keep my medication? This medication is given in a hospital or clinic. It will not be stored at home. NOTE: This sheet is a summary. It may not cover all possible information. If you have questions about this medicine, talk to your doctor, pharmacist, or health care provider.  2024 Elsevier/Gold Standard (2021-07-12 00:00:00)        To help prevent nausea and vomiting after your treatment, we encourage you to take your nausea medication as directed.  BELOW ARE SYMPTOMS THAT SHOULD BE REPORTED IMMEDIATELY: *FEVER GREATER THAN 100.4 F (38 C) OR HIGHER *CHILLS OR SWEATING *NAUSEA AND VOMITING THAT IS NOT CONTROLLED WITH YOUR NAUSEA MEDICATION *UNUSUAL SHORTNESS OF BREATH *UNUSUAL BRUISING OR BLEEDING *URINARY PROBLEMS (pain or burning when urinating, or frequent urination) *BOWEL PROBLEMS (unusual diarrhea, constipation, pain near the anus) TENDERNESS IN MOUTH AND THROAT WITH OR WITHOUT PRESENCE OF ULCERS (sore throat, sores in mouth, or a toothache) UNUSUAL RASH, SWELLING OR PAIN  UNUSUAL VAGINAL DISCHARGE OR ITCHING   Items with * indicate a potential emergency and should be followed up as soon as possible or go to the Emergency Department if any problems should occur.  Please show the CHEMOTHERAPY ALERT CARD or IMMUNOTHERAPY ALERT CARD at check-in to the Emergency Department and triage nurse.  Should you have questions after your visit or need to cancel or reschedule your appointment, please contact [facility] [phone]  and follow the prompts.  Office hours are 8:00  a.m. to 4:30 p.m. Monday - Friday. Please note that voicemails left after 4:00 p.m. may not be returned until the following business day.  We are closed weekends and major holidays. You have access to a nurse at all times for urgent questions. Please call the main number to the clinic [phone] and follow the prompts.  For any non-urgent questions, you may also contact your provider using MyChart. We now offer e-Visits for anyone 18 and older to request care online for non-urgent symptoms. For details visit [email or web address].   Also download the MyChart app! Go to the app store, search "MyChart", open the app, select [facility], and log in with your MyChart username and password.   

## 2022-09-18 NOTE — Progress Notes (Signed)
Patient has been examined by Dr. Katragadda. Vital signs and labs have been reviewed by MD - ANC, Creatinine, LFTs, hemoglobin, and platelets are within treatment parameters per M.D. - pt may proceed with treatment.  Primary RN and pharmacy notified.  

## 2022-09-18 NOTE — Progress Notes (Signed)
Patient presents today for chemotherapy infusion. Patient is in satisfactory condition with no new complaints voiced.  Vital signs are stable.  Labs reviewed by Dr. Ellin Saba during the office visit and all labs are within treatment parameters.  We will proceed with treatment per MD orders.   Urinalysis collected and sent to lab per orders by Dr. Ellin Saba.   Patient tolerated treatment well with no complaints voiced.  Patient left via wheelchair in stable condition.  Vital signs stable at discharge.  Follow up as scheduled.

## 2022-09-19 ENCOUNTER — Encounter (HOSPITAL_COMMUNITY): Payer: Self-pay

## 2022-09-19 ENCOUNTER — Encounter (HOSPITAL_COMMUNITY)
Admission: RE | Admit: 2022-09-19 | Discharge: 2022-09-19 | Disposition: A | Discharge status: Home / Self Care | Payer: Medicare HMO | Source: Ambulatory Visit | Attending: Internal Medicine | Admitting: Internal Medicine

## 2022-09-19 DIAGNOSIS — I10 Essential (primary) hypertension: Secondary | ICD-10-CM

## 2022-09-19 HISTORY — DX: Dyspnea, unspecified: R06.00

## 2022-09-19 NOTE — Telephone Encounter (Signed)
[employer] PA: Approved Authorization #[id number]  Tracking #[id number] Dates of service 09/25/2022 - 12/25/2022

## 2022-09-20 ENCOUNTER — Other Ambulatory Visit: Payer: Self-pay | Admitting: Radiation Therapy

## 2022-09-25 ENCOUNTER — Other Ambulatory Visit: Payer: Self-pay

## 2022-09-25 ENCOUNTER — Ambulatory Visit (HOSPITAL_COMMUNITY)
Admission: RE | Admit: 2022-09-25 | Discharge: 2022-09-25 | Disposition: A | Discharge status: Home / Self Care | Payer: Medicare HMO | Attending: Internal Medicine | Admitting: Internal Medicine

## 2022-09-25 ENCOUNTER — Ambulatory Visit (HOSPITAL_COMMUNITY): Payer: Medicare HMO | Admitting: Anesthesiology

## 2022-09-25 ENCOUNTER — Encounter (HOSPITAL_COMMUNITY)
Admission: RE | Disposition: A | Discharge status: Home / Self Care | Payer: Self-pay | Source: Home / Self Care | Attending: Internal Medicine

## 2022-09-25 ENCOUNTER — Encounter (HOSPITAL_COMMUNITY): Payer: Self-pay

## 2022-09-25 DIAGNOSIS — Z87891 Personal history of nicotine dependence: Secondary | ICD-10-CM | POA: Diagnosis not present

## 2022-09-25 DIAGNOSIS — D123 Benign neoplasm of transverse colon: Secondary | ICD-10-CM | POA: Diagnosis not present

## 2022-09-25 DIAGNOSIS — K648 Other hemorrhoids: Secondary | ICD-10-CM | POA: Insufficient documentation

## 2022-09-25 DIAGNOSIS — K3189 Other diseases of stomach and duodenum: Secondary | ICD-10-CM | POA: Diagnosis not present

## 2022-09-25 DIAGNOSIS — I2510 Atherosclerotic heart disease of native coronary artery without angina pectoris: Secondary | ICD-10-CM | POA: Diagnosis not present

## 2022-09-25 DIAGNOSIS — D509 Iron deficiency anemia, unspecified: Secondary | ICD-10-CM | POA: Insufficient documentation

## 2022-09-25 DIAGNOSIS — K2970 Gastritis, unspecified, without bleeding: Secondary | ICD-10-CM | POA: Diagnosis not present

## 2022-09-25 DIAGNOSIS — Z79899 Other long term (current) drug therapy: Secondary | ICD-10-CM | POA: Insufficient documentation

## 2022-09-25 DIAGNOSIS — K5730 Diverticulosis of large intestine without perforation or abscess without bleeding: Secondary | ICD-10-CM | POA: Insufficient documentation

## 2022-09-25 DIAGNOSIS — Z7901 Long term (current) use of anticoagulants: Secondary | ICD-10-CM | POA: Diagnosis not present

## 2022-09-25 DIAGNOSIS — Z9884 Bariatric surgery status: Secondary | ICD-10-CM | POA: Diagnosis not present

## 2022-09-25 DIAGNOSIS — R195 Other fecal abnormalities: Secondary | ICD-10-CM

## 2022-09-25 DIAGNOSIS — Z85118 Personal history of other malignant neoplasm of bronchus and lung: Secondary | ICD-10-CM | POA: Insufficient documentation

## 2022-09-25 DIAGNOSIS — Z980 Intestinal bypass and anastomosis status: Secondary | ICD-10-CM | POA: Insufficient documentation

## 2022-09-25 DIAGNOSIS — D126 Benign neoplasm of colon, unspecified: Secondary | ICD-10-CM | POA: Diagnosis not present

## 2022-09-25 DIAGNOSIS — D649 Anemia, unspecified: Secondary | ICD-10-CM | POA: Diagnosis not present

## 2022-09-25 DIAGNOSIS — E119 Type 2 diabetes mellitus without complications: Secondary | ICD-10-CM | POA: Insufficient documentation

## 2022-09-25 DIAGNOSIS — K635 Polyp of colon: Secondary | ICD-10-CM | POA: Diagnosis not present

## 2022-09-25 DIAGNOSIS — I10 Essential (primary) hypertension: Secondary | ICD-10-CM | POA: Diagnosis not present

## 2022-09-25 DIAGNOSIS — K921 Melena: Secondary | ICD-10-CM | POA: Diagnosis not present

## 2022-09-25 DIAGNOSIS — M1990 Unspecified osteoarthritis, unspecified site: Secondary | ICD-10-CM | POA: Diagnosis not present

## 2022-09-25 DIAGNOSIS — K2289 Other specified disease of esophagus: Secondary | ICD-10-CM | POA: Diagnosis not present

## 2022-09-25 DIAGNOSIS — K219 Gastro-esophageal reflux disease without esophagitis: Secondary | ICD-10-CM | POA: Diagnosis not present

## 2022-09-25 DIAGNOSIS — R6881 Early satiety: Secondary | ICD-10-CM | POA: Diagnosis not present

## 2022-09-25 DIAGNOSIS — I350 Nonrheumatic aortic (valve) stenosis: Secondary | ICD-10-CM | POA: Diagnosis not present

## 2022-09-25 DIAGNOSIS — Z9221 Personal history of antineoplastic chemotherapy: Secondary | ICD-10-CM | POA: Diagnosis not present

## 2022-09-25 HISTORY — PX: ESOPHAGEAL BRUSHING: SHX6842

## 2022-09-25 HISTORY — PX: POLYPECTOMY: SHX5525

## 2022-09-25 HISTORY — PX: BIOPSY: SHX5522

## 2022-09-25 HISTORY — PX: COLONOSCOPY WITH PROPOFOL: SHX5780

## 2022-09-25 HISTORY — PX: ESOPHAGOGASTRODUODENOSCOPY (EGD) WITH PROPOFOL: SHX5813

## 2022-09-25 LAB — KOH PREP: KOH Prep: NONE SEEN

## 2022-09-25 LAB — GLUCOSE, CAPILLARY: Glucose-Capillary: 128 mg/dL — ABNORMAL HIGH (ref 70–99)

## 2022-09-25 SURGERY — ESOPHAGOGASTRODUODENOSCOPY (EGD) WITH PROPOFOL
Anesthesia: General

## 2022-09-25 MED ORDER — LIDOCAINE HCL (PF) 2 % IJ SOLN
INTRAMUSCULAR | Status: AC
  Filled 2022-09-25: qty 5

## 2022-09-25 MED ORDER — PROPOFOL 500 MG/50ML IV EMUL
INTRAVENOUS | Status: DC | PRN
  Administered 2022-09-25: 100 ug/kg/min via INTRAVENOUS

## 2022-09-25 MED ORDER — LACTATED RINGERS IV SOLN: INTRAVENOUS | Status: DC

## 2022-09-25 MED ORDER — PHENYLEPHRINE HCL (PRESSORS) 10 MG/ML IV SOLN
INTRAVENOUS | Status: DC | PRN
  Administered 2022-09-25 (×2): 80 ug via INTRAVENOUS
  Administered 2022-09-25 (×2): 160 ug via INTRAVENOUS
  Administered 2022-09-25: 80 ug via INTRAVENOUS
  Administered 2022-09-25: 160 ug via INTRAVENOUS

## 2022-09-25 MED ORDER — LIDOCAINE HCL (CARDIAC) PF 100 MG/5ML IV SOSY
PREFILLED_SYRINGE | INTRAVENOUS | Status: DC | PRN
  Administered 2022-09-25: 50 mg via INTRAVENOUS

## 2022-09-25 MED ORDER — PROPOFOL 10 MG/ML IV BOLUS
INTRAVENOUS | Status: DC | PRN
  Administered 2022-09-25: 50 mg via INTRAVENOUS
  Administered 2022-09-25: 20 mg via INTRAVENOUS
  Administered 2022-09-25: 30 mg via INTRAVENOUS

## 2022-09-25 MED ORDER — LIDOCAINE VISCOUS HCL 2 % MT SOLN
OROMUCOSAL | Status: AC
  Filled 2022-09-25: qty 15

## 2022-09-25 MED ORDER — SODIUM CHLORIDE FLUSH 0.9 % IV SOLN
INTRAVENOUS | Status: AC
  Filled 2022-09-25: qty 10

## 2022-09-25 MED ORDER — PROPOFOL 500 MG/50ML IV EMUL
INTRAVENOUS | Status: AC
  Filled 2022-09-25: qty 50

## 2022-09-25 MED ORDER — LIDOCAINE VISCOUS HCL 2 % MT SOLN
15.0000 mL | Freq: Once | OROMUCOSAL | Status: AC
  Administered 2022-09-25: 15 mL via OROMUCOSAL

## 2022-09-25 MED ORDER — PHENYLEPHRINE 80 MCG/ML (10ML) SYRINGE FOR IV PUSH (FOR BLOOD PRESSURE SUPPORT)
PREFILLED_SYRINGE | INTRAVENOUS | Status: AC
  Filled 2022-09-25: qty 10

## 2022-09-25 NOTE — Op Note (Signed)
[facility] Patient Name: Denise [name] Procedure Date: 09/25/2022 11:49 AM MRN: [id number] Date of Birth: April 16, 1950 Attending MD: Hennie Duos. Maple Mirza, [id number] CSN: [id number] Age: 72 Admit Type: Outpatient Procedure:                Upper GI endoscopy Indications:              Iron deficiency anemia, Heme positive stool Providers:                Hennie Duos. Marletta Lor, DO, Sheran Fava, Pandora Leiter, Technician Referring MD:              Medicines:                See the Anesthesia note for documentation of the                            administered medications Complications:            No immediate complications. Estimated Blood Loss:     Estimated blood loss was minimal. Procedure:                Pre-Anesthesia Assessment:                           - The anesthesia plan was to use monitored                            anesthesia care (MAC).                           After obtaining informed consent, the endoscope was                            passed under direct vision. Throughout the                            procedure, the patient's blood pressure, pulse, and                            oxygen saturations were monitored continuously. The                            [id number]-[id number] ([id number]) scope was introduced through the                            mouth, and advanced to the efferent jejunal loop.                            The upper GI endoscopy was accomplished without                            difficulty. The patient tolerated the procedure                            well. Scope In:  12:01:15 PM Scope Out: 12:06:30 PM Total Procedure Duration: 0 hours 5 minutes 15 seconds  Findings:      White nummular lesions were noted in the upper third of the esophagus.       Cells for cytology were obtained by brushing.      Evidence of a Roux-en-Y gastrojejunostomy was found. The gastrojejunal       anastomosis was characterized by healthy appearing  mucosa. This was       traversed. The jejunojejunal anastomosis was characterized by healthy       appearing mucosa. The duodenum-to-jejunum limb was not examined as it       could not be reached.      Localized mucosal variance characterized by nodularity was found in the       gastric body. Biopsies were taken with a cold forceps for histology.      The examined jejunum was normal. Impression:               - White nummular lesions in esophageal mucosa.                            Cells for cytology obtained.                           - Roux-en-Y gastrojejunostomy with gastrojejunal                            anastomosis characterized by healthy appearing                            mucosa.                           - Gastric mucosal variant. Biopsied.                           - Normal examined jejunum. Moderate Sedation:      Per Anesthesia Care Recommendation:           - Patient has a contact number available for                            emergencies. The signs and symptoms of potential                            delayed complications were discussed with the                            patient. Return to normal activities tomorrow.                            Written discharge instructions were provided to the                            patient.                           - Resume previous diet.                           -  Continue present medications.                           - Await pathology results.                           - Return to GI clinic in 3 months. Procedure Code(s):        --- Professional ---                           [phone], Esophagogastroduodenoscopy, flexible,                            transoral; with biopsy, single or multiple Diagnosis Code(s):        --- Professional ---                           K22.89, Other specified disease of esophagus                           Z98.0, Intestinal bypass and anastomosis status                           K31.89, Other  diseases of stomach and duodenum                           D50.9, Iron deficiency anemia, unspecified                           R19.5, Other fecal abnormalities CPT copyright 2022 American Medical Association. All rights reserved. The codes documented in this report are preliminary and upon coder review may  be revised to meet current compliance requirements. Hennie Duos. Marletta Lor, DO Hennie Duos. Marletta Lor, DO 09/25/2022 12:08:55 PM This report has been signed electronically. Number of Addenda: 0

## 2022-09-25 NOTE — Anesthesia Preprocedure Evaluation (Addendum)
Anesthesia Evaluation  Patient identified by MRN, date of birth, ID band Patient awake    Reviewed: Allergy & Precautions, H&P , NPO status , Patient's Chart, lab work & pertinent test results, reviewed documented beta blocker date and time   Airway Mallampati: I  TM Distance: >3 FB Neck ROM: Full    Dental  (+) Edentulous Upper, Edentulous Lower, Dental Advisory Given   Pulmonary shortness of breath, with exertion and Long-Term Oxygen Therapy, former smoker, PE Carcinoma left lung, s/p radiation   Pulmonary exam normal breath sounds clear to auscultation       Cardiovascular Exercise Tolerance: Poor hypertension, Pt. on medications and Pt. on home beta blockers + CAD and + DOE  + Valvular Problems/Murmurs (severe aortic stenosis) AS  Rhythm:Irregular Rate:Normal + Systolic murmurs Jonelle Sidle, MD 07/19/2022  2:34 PM EDT   Results reviewed.  Echocardiogram shows normal LVEF at 60 to 65%.  Aortic valve stenosis has progressed with mean gradient 42 mmHg although also in the setting of moderate aortic regurgitation.  Dimensionless index is 0.36.  Stenosis is at least moderate to severe based on all parameters.  Keep scheduled follow-up for now.   1. Left ventricular ejection fraction, by estimation, is 60 to 65%. The  left ventricle has normal function. The left ventricle has no regional  wall motion abnormalities. There is moderate left ventricular hypertrophy.  Left ventricular diastolic  parameters are consistent with Grade I diastolic dysfunction (impaired  relaxation).   2. Ventricular septum is flattened in systole suggesting RV pressure  overload. . Right ventricular systolic function is normal. The right  ventricular size is mildly enlarged. There is severely elevated pulmonary  artery systolic pressure.   3. Left atrial size was mildly dilated.   4. The mitral valve is abnormal. Trivial mitral valve regurgitation. No   evidence of mitral stenosis. Moderate mitral annular calcification.   5. The tricuspid valve is abnormal. Tricuspid valve regurgitation is  moderate.   6. The aortic valve is tricuspid. There is moderate calcification of the  aortic valve. There is moderate thickening of the aortic valve. Aortic  valve regurgitation is moderate. Severe aortic valve stenosis. Aortic  valve mean gradient measures 42.0 mmHg.   Aortic valve peak gradient measures 68.3 mmHg. Aortic valve area, by VTI  measures 0.92 cm.   7. The inferior vena cava is normal in size with greater than 50%  respiratory variability, suggesting right atrial pressure of 3 mmHg.     Neuro/Psych Meningioma  Metastasis to brain  Neuromuscular disease  negative psych ROS   GI/Hepatic Neg liver ROS,GERD  Medicated and Controlled,,  Endo/Other  diabetes, Well Controlled, Type 2, Oral Hypoglycemic Agents    Renal/GU negative Renal ROS  negative genitourinary   Musculoskeletal  (+) Arthritis , Osteoarthritis,    Abdominal   Peds negative pediatric ROS (+)  Hematology  (+) Blood dyscrasia, anemia   Anesthesia Other Findings   Reproductive/Obstetrics negative OB ROS                             Anesthesia Physical Anesthesia Plan  ASA: 4  Anesthesia Plan: General   Post-op Pain Management: Minimal or no pain anticipated   Induction: Intravenous  PONV Risk Score and Plan: 1 and Propofol infusion  Airway Management Planned: Nasal Cannula and Natural Airway  Additional Equipment:   Intra-op Plan:   Post-operative Plan:   Informed Consent: I have reviewed the patients  History and Physical, chart, labs and discussed the procedure including the risks, benefits and alternatives for the proposed anesthesia with the patient or authorized representative who has indicated his/her understanding and acceptance.       Plan Discussed with: CRNA and Surgeon  Anesthesia Plan Comments:          Anesthesia Quick Evaluation

## 2022-09-25 NOTE — Transfer of Care (Signed)
Immediate Anesthesia Transfer of Care Note  Patient: Denise [name]  Procedure(s) Performed: ESOPHAGOGASTRODUODENOSCOPY (EGD) WITH PROPOFOL COLONOSCOPY WITH PROPOFOL ESOPHAGEAL BRUSHING BIOPSY POLYPECTOMY  Patient Location: Short Stay  Anesthesia Type:General  Level of Consciousness: awake  Airway & Oxygen Therapy: Patient Spontanous Breathing and Patient connected to nasal cannula oxygen  Post-op Assessment: Report given to RN and Post -op Vital signs reviewed and stable  Post vital signs: Reviewed and stable  Last Vitals:  Vitals Value Taken Time  BP    Temp    Pulse    Resp    SpO2      Last Pain:  Vitals:   09/25/22 1153  TempSrc:   PainSc: 6       Patients Stated Pain Goal: 7 (09/25/22 0952)  Complications: No notable events documented.

## 2022-09-25 NOTE — Discharge Instructions (Addendum)
EGD Discharge instructions Please read the instructions outlined below and refer to this sheet in the next few weeks. These discharge instructions provide you with general information on caring for yourself after you leave the hospital. Your doctor may also give you specific instructions. While your treatment has been planned according to the most current medical practices available, unavoidable complications occasionally occur. If you have any problems or questions after discharge, please call your doctor. ACTIVITY You may resume your regular activity but move at a slower pace for the next 24 hours.  Take frequent rest periods for the next 24 hours.  Walking will help expel (get rid of) the air and reduce the bloated feeling in your abdomen.  No driving for 24 hours (because of the anesthesia (medicine) used during the test).  You may shower.  Do not sign any important legal documents or operate any machinery for 24 hours (because of the anesthesia used during the test).  NUTRITION Drink plenty of fluids.  You may resume your normal diet.  Begin with a light meal and progress to your normal diet.  Avoid alcoholic beverages for 24 hours or as instructed by your caregiver.  MEDICATIONS You may resume your normal medications unless your caregiver tells you otherwise.  WHAT YOU CAN EXPECT TODAY You may experience abdominal discomfort such as a feeling of fullness or "gas" pains.  FOLLOW-UP Your doctor will discuss the results of your test with you.  SEEK IMMEDIATE MEDICAL ATTENTION IF ANY OF THE FOLLOWING OCCUR: Excessive nausea (feeling sick to your stomach) and/or vomiting.  Severe abdominal pain and distention (swelling).  Trouble swallowing.  Temperature over 101 F (37.8 C).  Rectal bleeding or vomiting of blood.      Colonoscopy Discharge Instructions  Read the instructions outlined below and refer to this sheet in the next few weeks. These discharge instructions provide you  with general information on caring for yourself after you leave the hospital. Your doctor may also give you specific instructions. While your treatment has been planned according to the most current medical practices available, unavoidable complications occasionally occur.   ACTIVITY You may resume your regular activity, but move at a slower pace for the next 24 hours.  Take frequent rest periods for the next 24 hours.  Walking will help get rid of the air and reduce the bloated feeling in your belly (abdomen).  No driving for 24 hours (because of the medicine (anesthesia) used during the test).   Do not sign any important legal documents or operate any machinery for 24 hours (because of the anesthesia used during the test).  NUTRITION Drink plenty of fluids.  You may resume your normal diet as instructed by your doctor.  Begin with a light meal and progress to your normal diet. Heavy or fried foods are harder to digest and may make you feel sick to your stomach (nauseated).  Avoid alcoholic beverages for 24 hours or as instructed.  MEDICATIONS You may resume your normal medications unless your doctor tells you otherwise.  WHAT YOU CAN EXPECT TODAY Some feelings of bloating in the abdomen.  Passage of more gas than usual.  Spotting of blood in your stool or on the toilet paper.  IF YOU HAD POLYPS REMOVED DURING THE COLONOSCOPY: No aspirin products for 7 days or as instructed.  No alcohol for 7 days or as instructed.  Eat a soft diet for the next 24 hours.  FINDING OUT THE RESULTS OF YOUR TEST Not all test results  are available during your visit. If your test results are not back during the visit, make an appointment with your caregiver to find out the results. Do not assume everything is normal if you have not heard from your caregiver or the medical facility. It is important for you to follow up on all of your test results.  SEEK IMMEDIATE MEDICAL ATTENTION IF: You have more than a  spotting of blood in your stool.  Your belly is swollen (abdominal distention).  You are nauseated or vomiting.  You have a temperature over 101.  You have abdominal pain or discomfort that is severe or gets worse throughout the day.   Your upper endoscopy revealed mucosal changes of the stomach pouch.  Likely this is due to chronic iron ingestion.  I did take samples.  I also took samples of your esophagus to rule out a yeast infection.  The rest of your upper GI tract looked normal.  Overall, your colon looked very healthy.  You had 1 very small polyp which I removed successfully.  Await pathology results, office will contact you.  I do not think you need further colonoscopy for surveillance purposes given your age.  You do have diverticulosis and internal hemorrhoids. I would recommend increasing fiber in your diet or adding OTC Benefiber/Metamucil. Be sure to drink at least 4 to 6 glasses of water daily. Follow-up with GI in 3 months   I hope you have a great rest of your week!  Hennie Duos. Marletta Lor, D.O. Gastroenterology and Hepatology [facility]

## 2022-09-25 NOTE — Op Note (Signed)
[facility] Patient Name: Denise [name] Procedure Date: 09/25/2022 11:48 AM MRN: [id number] Date of Birth: March 04, 1951 Attending MD: Hennie Duos. Maple Mirza, [id number] CSN: [id number] Age: 72 Admit Type: Outpatient Procedure:                Colonoscopy Indications:              Heme positive stool, Iron deficiency anemia Providers:                Hennie Duos. Marletta Lor, DO, Sheran Fava, Pandora Leiter, Technician Referring MD:              Medicines:                See the Anesthesia note for documentation of the                            administered medications Complications:            No immediate complications. Estimated Blood Loss:     Estimated blood loss was minimal. Procedure:                Pre-Anesthesia Assessment:                           - The anesthesia plan was to use monitored                            anesthesia care (MAC).                           After obtaining informed consent, the colonoscope                            was passed under direct vision. Throughout the                            procedure, the patient's blood pressure, pulse, and                            oxygen saturations were monitored continuously. The                            [id number]-HQ190L ([id number]) scope was introduced through                            the anus and advanced to the the cecum, identified                            by appendiceal orifice and ileocecal valve. The                            colonoscopy was performed without difficulty. The                            patient tolerated the procedure well.  The quality                            of the bowel preparation was evaluated using the                            BBPS [facility] Bowel Preparation Scale) with scores                            of: Right Colon = 2 (minor amount of residual                            staining, small fragments of stool and/or opaque                            liquid, but  mucosa seen well), Transverse Colon = 2                            (minor amount of residual staining, small fragments                            of stool and/or opaque liquid, but mucosa seen                            well) and Left Colon = 2 (minor amount of residual                            staining, small fragments of stool and/or opaque                            liquid, but mucosa seen well). The total BBPS score                            equals 6. The quality of the bowel preparation was                            good. Scope In: 12:13:27 PM Scope Out: 12:29:28 PM Scope Withdrawal Time: 0 hours 11 minutes 40 seconds  Total Procedure Duration: 0 hours 16 minutes 1 second  Findings:      Non-bleeding internal hemorrhoids were found during endoscopy.      Many large-mouthed and small-mouthed diverticula were found in the       entire colon.      A 2 mm polyp was found in the transverse colon. The polyp was sessile.       The polyp was removed with a cold biopsy forceps. Resection and       retrieval were complete.      The exam was otherwise without abnormality. Impression:               - Non-bleeding internal hemorrhoids.                           - Diverticulosis in the entire examined colon.                           -  One 2 mm polyp in the transverse colon, removed                            with a cold biopsy forceps. Resected and retrieved.                           - The examination was otherwise normal. Moderate Sedation:      Per Anesthesia Care Recommendation:           - Patient has a contact number available for                            emergencies. The signs and symptoms of potential                            delayed complications were discussed with the                            patient. Return to normal activities tomorrow.                            Written discharge instructions were provided to the                            patient.                            - Resume previous diet.                           - Continue present medications.                           - Await pathology results.                           - No repeat colonoscopy due to age.                           - Return to GI clinic in 3 months. Procedure Code(s):        --- Professional ---                           [id number], Colonoscopy, flexible; with biopsy, single                            or multiple Diagnosis Code(s):        --- Professional ---                           K64.8, Other hemorrhoids                           D12.3, Benign neoplasm of transverse colon (hepatic                            flexure  or splenic flexure)                           R19.5, Other fecal abnormalities                           D50.9, Iron deficiency anemia, unspecified                           K57.30, Diverticulosis of large intestine without                            perforation or abscess without bleeding CPT copyright 2022 American Medical Association. All rights reserved. The codes documented in this report are preliminary and upon coder review may  be revised to meet current compliance requirements. Hennie Duos. Marletta Lor, DO Hennie Duos. Marletta Lor, DO 09/25/2022 12:31:52 PM This report has been signed electronically. Number of Addenda: 0

## 2022-09-25 NOTE — Interval H&P Note (Signed)
History and Physical Interval Note:  09/25/2022 10:40 AM  Denise [name]  has presented today for surgery, with the diagnosis of ANEMIA, EARLY SATIETY, FOBT POSITIVE.  The various methods of treatment have been discussed with the patient and family. After consideration of risks, benefits and other options for treatment, the patient has consented to  Procedure(s) with comments: ESOPHAGOGASTRODUODENOSCOPY (EGD) WITH PROPOFOL (N/A) - 11:00AM;ASA 3 COLONOSCOPY WITH PROPOFOL (N/A) - 11:00;ASA 3 as a surgical intervention.  The patient's history has been reviewed, patient examined, no change in status, stable for surgery.  I have reviewed the patient's chart and labs.  Questions were answered to the patient's satisfaction.     Lanelle Bal

## 2022-09-25 NOTE — Anesthesia Postprocedure Evaluation (Addendum)
Anesthesia Post Note  Patient: Katena April  Procedure(s) Performed: ESOPHAGOGASTRODUODENOSCOPY (EGD) WITH PROPOFOL COLONOSCOPY WITH PROPOFOL ESOPHAGEAL BRUSHING BIOPSY POLYPECTOMY  Patient location during evaluation: Phase II Anesthesia Type: General Level of consciousness: awake and alert and oriented Pain management: pain level controlled Vital Signs Assessment: post-procedure vital signs reviewed and stable Respiratory status: spontaneous breathing, nonlabored ventilation, respiratory function stable and patient connected to nasal cannula oxygen Cardiovascular status: blood pressure returned to baseline and stable Postop Assessment: no apparent nausea or vomiting Anesthetic complications: no  No notable events documented.   Last Vitals:  Vitals:   09/25/22 1232 09/25/22 1239  BP: 90/76 105/76  Pulse: 96   Resp: (!) 27   Temp: 36.9 C   SpO2: 97%     Last Pain:  Vitals:   09/25/22 1232  TempSrc: Oral  PainSc: 2                  Jorryn Casagrande C Shantera Monts

## 2022-09-25 NOTE — Addendum Note (Signed)
Addendum  created 09/25/22 1515 by Molli Barrows, MD   Clinical Note Signed

## 2022-09-25 NOTE — Progress Notes (Signed)
Patient discharged on 3 L home O2.

## 2022-09-26 LAB — SURGICAL PATHOLOGY

## 2022-09-28 NOTE — Telephone Encounter (Signed)
Please Advise

## 2022-09-29 ENCOUNTER — Encounter (HOSPITAL_COMMUNITY): Payer: Self-pay | Admitting: Internal Medicine

## 2022-09-29 ENCOUNTER — Other Ambulatory Visit: Payer: Self-pay | Admitting: Cardiology

## 2022-10-02 ENCOUNTER — Ambulatory Visit (HOSPITAL_COMMUNITY)
Admission: RE | Admit: 2022-10-02 | Discharge: 2022-10-02 | Disposition: A | Discharge status: Home / Self Care | Payer: Medicare HMO | Source: Ambulatory Visit | Attending: Neurosurgery | Admitting: Neurosurgery

## 2022-10-02 DIAGNOSIS — D320 Benign neoplasm of cerebral meninges: Secondary | ICD-10-CM | POA: Diagnosis not present

## 2022-10-02 DIAGNOSIS — D329 Benign neoplasm of meninges, unspecified: Secondary | ICD-10-CM | POA: Diagnosis not present

## 2022-10-02 DIAGNOSIS — R930 Abnormal findings on diagnostic imaging of skull and head, not elsewhere classified: Secondary | ICD-10-CM | POA: Diagnosis not present

## 2022-10-02 MED ORDER — GADOBUTROL 1 MMOL/ML IV SOLN
10.0000 mL | Freq: Once | INTRAVENOUS | Status: AC | PRN
  Administered 2022-10-02: 10 mL via INTRAVENOUS

## 2022-10-02 MED ORDER — HEPARIN SOD (PORK) LOCK FLUSH 100 UNIT/ML IV SOLN
500.0000 [IU] | INTRAVENOUS | Status: AC | PRN
  Administered 2022-10-02: 500 [IU]

## 2022-10-05 ENCOUNTER — Other Ambulatory Visit: Payer: Self-pay | Admitting: *Deleted

## 2022-10-05 DIAGNOSIS — M79604 Pain in right leg: Secondary | ICD-10-CM

## 2022-10-09 ENCOUNTER — Inpatient Hospital Stay: Payer: Medicare HMO | Admitting: Hematology

## 2022-10-09 ENCOUNTER — Ambulatory Visit (HOSPITAL_COMMUNITY)
Admission: RE | Admit: 2022-10-09 | Discharge: 2022-10-09 | Disposition: A | Discharge status: Home / Self Care | Payer: Medicare HMO | Source: Ambulatory Visit | Attending: Hematology | Admitting: Hematology

## 2022-10-09 ENCOUNTER — Inpatient Hospital Stay: Payer: Medicare HMO

## 2022-10-09 ENCOUNTER — Other Ambulatory Visit: Payer: Self-pay | Admitting: *Deleted

## 2022-10-09 ENCOUNTER — Other Ambulatory Visit: Payer: Self-pay

## 2022-10-09 DIAGNOSIS — R0602 Shortness of breath: Secondary | ICD-10-CM

## 2022-10-09 DIAGNOSIS — C3492 Malignant neoplasm of unspecified part of left bronchus or lung: Secondary | ICD-10-CM

## 2022-10-09 DIAGNOSIS — J90 Pleural effusion, not elsewhere classified: Secondary | ICD-10-CM | POA: Insufficient documentation

## 2022-10-09 DIAGNOSIS — C3490 Malignant neoplasm of unspecified part of unspecified bronchus or lung: Secondary | ICD-10-CM | POA: Diagnosis not present

## 2022-10-09 DIAGNOSIS — R590 Localized enlarged lymph nodes: Secondary | ICD-10-CM | POA: Diagnosis not present

## 2022-10-09 DIAGNOSIS — D649 Anemia, unspecified: Secondary | ICD-10-CM

## 2022-10-09 DIAGNOSIS — I700 Atherosclerosis of aorta: Secondary | ICD-10-CM | POA: Diagnosis not present

## 2022-10-09 LAB — CBC WITH DIFFERENTIAL/PLATELET
Abs Immature Granulocytes: 0.02 10*3/uL (ref 0.00–0.07)
Basophils Absolute: 0 10*3/uL (ref 0.0–0.1)
Basophils Relative: 1 %
Eosinophils Absolute: 0 10*3/uL (ref 0.0–0.5)
Eosinophils Relative: 1 %
HCT: 24.6 % — ABNORMAL LOW (ref 36.0–46.0)
Hemoglobin: 7.3 g/dL — ABNORMAL LOW (ref 12.0–15.0)
Immature Granulocytes: 0 %
Lymphocytes Relative: 13 %
Lymphs Abs: 0.8 10*3/uL (ref 0.7–4.0)
MCH: 28.5 pg (ref 26.0–34.0)
MCHC: 29.7 g/dL — ABNORMAL LOW (ref 30.0–36.0)
MCV: 96.1 fL (ref 80.0–100.0)
Monocytes Absolute: 0.6 10*3/uL (ref 0.1–1.0)
Monocytes Relative: 9 %
Neutro Abs: 4.6 10*3/uL (ref 1.7–7.7)
Neutrophils Relative %: 76 %
Platelets: 517 10*3/uL — ABNORMAL HIGH (ref 150–400)
RBC: 2.56 MIL/uL — ABNORMAL LOW (ref 3.87–5.11)
RDW: 18 % — ABNORMAL HIGH (ref 11.5–15.5)
WBC: 6 10*3/uL (ref 4.0–10.5)
nRBC: 0 % (ref 0.0–0.2)

## 2022-10-09 LAB — COMPREHENSIVE METABOLIC PANEL
ALT: 11 U/L (ref 0–44)
AST: 15 U/L (ref 15–41)
Albumin: 2.1 g/dL — ABNORMAL LOW (ref 3.5–5.0)
Alkaline Phosphatase: 92 U/L (ref 38–126)
Anion gap: 8 (ref 5–15)
BUN: 8 mg/dL (ref 8–23)
CO2: 26 mmol/L (ref 22–32)
Calcium: 8.1 mg/dL — ABNORMAL LOW (ref 8.9–10.3)
Chloride: 104 mmol/L (ref 98–111)
Creatinine, Ser: 0.91 mg/dL (ref 0.44–1.00)
GFR, Estimated: >60 mL/min (ref >60)
Glucose, Bld: 121 mg/dL — ABNORMAL HIGH (ref 70–99)
Potassium: 3.7 mmol/L (ref 3.5–5.1)
Sodium: 138 mmol/L (ref 135–145)
Total Bilirubin: 0.4 mg/dL (ref 0.3–1.2)
Total Protein: 6.3 g/dL — ABNORMAL LOW (ref 6.5–8.1)

## 2022-10-09 LAB — MAGNESIUM: Magnesium: 1.8 mg/dL (ref 1.7–2.4)

## 2022-10-09 LAB — TSH: TSH: 1.879 u[IU]/mL (ref 0.350–4.500)

## 2022-10-09 MED ORDER — HEPARIN SOD (PORK) LOCK FLUSH 100 UNIT/ML IV SOLN
500.0000 [IU] | Freq: Once | INTRAVENOUS | Status: AC
  Administered 2022-10-09: 500 [IU] via INTRAVENOUS

## 2022-10-09 MED ORDER — IOHEXOL 300 MG/ML  SOLN
80.0000 mL | Freq: Once | INTRAMUSCULAR | Status: AC | PRN
  Administered 2022-10-09: 80 mL via INTRAVENOUS

## 2022-10-09 MED ORDER — PREDNISONE 50 MG PO TABS: ORAL_TABLET | ORAL | 0 refills | Status: DC

## 2022-10-09 MED ORDER — LEVOFLOXACIN 500 MG PO TABS: 500.0000 mg | ORAL_TABLET | Freq: Every day | ORAL | 0 refills | Status: DC

## 2022-10-09 MED ORDER — SODIUM CHLORIDE 0.9% FLUSH
10.0000 mL | Freq: Once | INTRAVENOUS | Status: AC
  Administered 2022-10-09: 10 mL

## 2022-10-09 NOTE — Patient Instructions (Signed)

## 2022-10-09 NOTE — Progress Notes (Signed)
Patient came today for lab work. Hgb 7.3.  Dr. Ellin Saba aware.  Patient is coming to get 1 unit PRBC tomorrow.  She is aware and will also take her Tylenol 650 mg and Benadryl 25 mg before the appt.

## 2022-10-09 NOTE — Progress Notes (Signed)
Patient presents today for Keytruda/Alimta infusions.  Patient is complaining of increased SOB over the last few days.  Patient states that symptoms feel similar to those she had with a PE.  MD made aware.  Oxygen sats were mid 70s at arrival on room air.  Sats increased to 94% on 4 L of oxygen.  All other vitals are stable.  Dr. Ellin Saba ordered at STAT chest CT scan.   STAT showed pleural effusion.  Dr. Ellin Saba will treat patient with Prednisone and Levaquin.  Prescriptions sent and patient aware.  Hold treatment today per MD.  Hemoglobin today is 7.3. We will arrange for 1 unit of PRBC this week per Dr. Ellin Saba.  Patient left via wheelchair in stable condition.

## 2022-10-09 NOTE — Progress Notes (Signed)
Order placed for STAT CT Chest per Dr. Ellin Saba

## 2022-10-10 ENCOUNTER — Inpatient Hospital Stay: Payer: Medicare HMO

## 2022-10-10 DIAGNOSIS — R0602 Shortness of breath: Secondary | ICD-10-CM

## 2022-10-10 DIAGNOSIS — C3412 Malignant neoplasm of upper lobe, left bronchus or lung: Secondary | ICD-10-CM | POA: Diagnosis not present

## 2022-10-10 DIAGNOSIS — D649 Anemia, unspecified: Secondary | ICD-10-CM | POA: Diagnosis not present

## 2022-10-10 DIAGNOSIS — Z5111 Encounter for antineoplastic chemotherapy: Secondary | ICD-10-CM | POA: Diagnosis not present

## 2022-10-10 DIAGNOSIS — I2699 Other pulmonary embolism without acute cor pulmonale: Secondary | ICD-10-CM | POA: Diagnosis not present

## 2022-10-10 DIAGNOSIS — Z95828 Presence of other vascular implants and grafts: Secondary | ICD-10-CM

## 2022-10-10 DIAGNOSIS — C787 Secondary malignant neoplasm of liver and intrahepatic bile duct: Secondary | ICD-10-CM | POA: Diagnosis not present

## 2022-10-10 DIAGNOSIS — Z7901 Long term (current) use of anticoagulants: Secondary | ICD-10-CM | POA: Diagnosis not present

## 2022-10-10 DIAGNOSIS — C3492 Malignant neoplasm of unspecified part of left bronchus or lung: Secondary | ICD-10-CM

## 2022-10-10 DIAGNOSIS — C7801 Secondary malignant neoplasm of right lung: Secondary | ICD-10-CM | POA: Diagnosis not present

## 2022-10-10 DIAGNOSIS — D329 Benign neoplasm of meninges, unspecified: Secondary | ICD-10-CM | POA: Diagnosis not present

## 2022-10-10 DIAGNOSIS — Z5112 Encounter for antineoplastic immunotherapy: Secondary | ICD-10-CM | POA: Diagnosis not present

## 2022-10-10 LAB — CBC WITH DIFFERENTIAL/PLATELET
Abs Immature Granulocytes: 0.03 10*3/uL (ref 0.00–0.07)
Basophils Absolute: 0 10*3/uL (ref 0.0–0.1)
Basophils Relative: 1 %
Eosinophils Absolute: 0 10*3/uL (ref 0.0–0.5)
Eosinophils Relative: 1 %
HCT: 24.9 % — ABNORMAL LOW (ref 36.0–46.0)
Hemoglobin: 7.3 g/dL — ABNORMAL LOW (ref 12.0–15.0)
Immature Granulocytes: 1 %
Lymphocytes Relative: 11 %
Lymphs Abs: 0.7 10*3/uL (ref 0.7–4.0)
MCH: 28.4 pg (ref 26.0–34.0)
MCHC: 29.3 g/dL — ABNORMAL LOW (ref 30.0–36.0)
MCV: 96.9 fL (ref 80.0–100.0)
Monocytes Absolute: 0.5 10*3/uL (ref 0.1–1.0)
Monocytes Relative: 7 %
Neutro Abs: 5.1 10*3/uL (ref 1.7–7.7)
Neutrophils Relative %: 79 %
Platelets: 464 10*3/uL — ABNORMAL HIGH (ref 150–400)
RBC: 2.57 MIL/uL — ABNORMAL LOW (ref 3.87–5.11)
RDW: 18 % — ABNORMAL HIGH (ref 11.5–15.5)
WBC: 6.4 10*3/uL (ref 4.0–10.5)
nRBC: 0 % (ref 0.0–0.2)

## 2022-10-10 LAB — TYPE AND SCREEN: Unit division: 0

## 2022-10-10 LAB — BPAM RBC
ISSUE DATE / TIME: 202407230931
Unit Type and Rh: 5100

## 2022-10-10 LAB — PREPARE RBC (CROSSMATCH)

## 2022-10-10 LAB — T4: T4, Total: 7.5 ug/dL (ref 4.5–12.0)

## 2022-10-10 MED ORDER — SODIUM CHLORIDE 0.9% FLUSH
10.0000 mL | INTRAVENOUS | Status: DC | PRN
  Administered 2022-10-10: 10 mL via INTRAVENOUS

## 2022-10-10 MED ORDER — SODIUM CHLORIDE 0.9% FLUSH
10.0000 mL | INTRAVENOUS | Status: AC | PRN
  Administered 2022-10-10: 10 mL

## 2022-10-10 MED ORDER — HEPARIN SOD (PORK) LOCK FLUSH 100 UNIT/ML IV SOLN
500.0000 [IU] | Freq: Every day | INTRAVENOUS | Status: AC | PRN
  Administered 2022-10-10: 500 [IU]

## 2022-10-10 MED ORDER — SODIUM CHLORIDE 0.9% IV SOLUTION
250.0000 mL | Freq: Once | INTRAVENOUS | Status: AC
  Administered 2022-10-10: 250 mL via INTRAVENOUS

## 2022-10-10 NOTE — Patient Instructions (Signed)
[facility]  Discharge Instructions: Thank you for choosing [facility] to provide your oncology and hematology care.  If you have a lab appointment with the Cancer Center - please note that after April 8th, 2024, all labs will be drawn in the cancer center.  You do not have to check in or register with the main entrance as you have in the past but will complete your check-in in the cancer center.  Wear comfortable clothing and clothing appropriate for easy access to any Portacath or PICC line.   We strive to give you quality time with your provider. You may need to reschedule your appointment if you arrive late (15 or more minutes).  Arriving late affects you and other patients whose appointments are after yours.  Also, if you miss three or more appointments without notifying the office, you may be dismissed from the clinic at the provider's discretion.      For prescription refill requests, have your pharmacy contact our office and allow 72 hours for refills to be completed.    Today you received the following chemotherapy and/or immunotherapy agents Blood      To help prevent nausea and vomiting after your treatment, we encourage you to take your nausea medication as directed.  BELOW ARE SYMPTOMS THAT SHOULD BE REPORTED IMMEDIATELY: *FEVER GREATER THAN 100.4 F (38 C) OR HIGHER *CHILLS OR SWEATING *NAUSEA AND VOMITING THAT IS NOT CONTROLLED WITH YOUR NAUSEA MEDICATION *UNUSUAL SHORTNESS OF BREATH *UNUSUAL BRUISING OR BLEEDING *URINARY PROBLEMS (pain or burning when urinating, or frequent urination) *BOWEL PROBLEMS (unusual diarrhea, constipation, pain near the anus) TENDERNESS IN MOUTH AND THROAT WITH OR WITHOUT PRESENCE OF ULCERS (sore throat, sores in mouth, or a toothache) UNUSUAL RASH, SWELLING OR PAIN  UNUSUAL VAGINAL DISCHARGE OR ITCHING   Items with * indicate a potential emergency and should be followed up as soon as possible or go to the  Emergency Department if any problems should occur.  Please show the CHEMOTHERAPY ALERT CARD or IMMUNOTHERAPY ALERT CARD at check-in to the Emergency Department and triage nurse.  Should you have questions after your visit or need to cancel or reschedule your appointment, please contact [facility] [phone]  and follow the prompts.  Office hours are 8:00 a.m. to 4:30 p.m. Monday - Friday. Please note that voicemails left after 4:00 p.m. may not be returned until the following business day.  We are closed weekends and major holidays. You have access to a nurse at all times for urgent questions. Please call the main number to the clinic [phone] and follow the prompts.  For any non-urgent questions, you may also contact your provider using MyChart. We now offer e-Visits for anyone 57 and older to request care online for non-urgent symptoms. For details visit [email or web address].   Also download the MyChart app! Go to the app store, search "MyChart", open the app, select [facility], and log in with your MyChart username and password.

## 2022-10-10 NOTE — Progress Notes (Signed)
Patients port flushed without difficulty.  Good blood return noted with no bruising or swelling noted at site.  Patient remains accessed for treatment.  

## 2022-10-10 NOTE — Progress Notes (Signed)
Patient presents today for 1UPRBC per providers order.  Vital signs WNL.  Patient has no new complaints at this time.  Patient took premedications at home at 0745.  Consent is signed and a request to the MD for an Attestation was sent.  Transfusion without adverse affects.  Vital signs stable.  No complaints at this time.  Discharge from clinic via wheelchair in stable condition.  Alert and oriented X 3.  Follow up with [facility] as scheduled.

## 2022-10-11 LAB — BPAM RBC: Blood Product Expiration Date: 202408292359

## 2022-10-11 LAB — TYPE AND SCREEN
ABO/RH(D): O POS
Antibody Screen: NEGATIVE

## 2022-10-16 DIAGNOSIS — Z6841 Body Mass Index (BMI) 40.0 and over, adult: Secondary | ICD-10-CM | POA: Diagnosis not present

## 2022-10-16 DIAGNOSIS — D329 Benign neoplasm of meninges, unspecified: Secondary | ICD-10-CM | POA: Diagnosis not present

## 2022-10-18 ENCOUNTER — Encounter (HOSPITAL_COMMUNITY): Payer: Medicare HMO

## 2022-10-18 NOTE — Progress Notes (Signed)
[facility] [address], [place] [address]    Clinic Day:  10/19/2022  Referring physician: Mirna Mires, MD  Patient Care Team: Mirna Mires, MD as PCP - General (Family Medicine) Jonelle Sidle, MD as PCP - Cardiology (Cardiology) Doreatha Massed, MD as Medical Oncologist (Medical Oncology)   ASSESSMENT & PLAN:   Assessment: 1.  Adenocarcinoma of left lung (HCC) -Chemoradiation therapy with carboplatin and paclitaxel from 01/07/2019 through 02/11/2019. -Consolidation immunotherapy with durvalumab from 03/19/2019 through 04/15/2018, held due to pneumonitis. -CT chest on 07/02/2019 shows left upper lobe lung mass measuring 2.6 x 2.0 cm.  It shows improvement in size. -MRI of the brain on 07/18/2019 showed left sphenoid wing meningioma measuring 2.6 x 2.0 x 2.6 cm unchanged.  No new enhancing intracranial lesion.  Increased left temporal white matter edema. -Durvalumab restarted on 07/24/2019. -She was evaluated by cardiology with a stress test.  EF was 46%.  She underwent cardiac catheterization which did not show any abnormalities. - 1 year of durvalumab completed on 07/23/2020. - Liver mass biopsy (08/08/2021): Adenocarcinoma, CK7 positive, negative for TTF-1, Napsin a, GATA3, ER, CK20, CDX2 - PET scan (08/25/2021): Right upper lobe lung nodule 9 mm, SUV 5.0.  Groundglass and solid nodule periphery of the right upper lobe 8 mm, SUV 2.45.  Right lobe liver mass 4.5 x 3.6 cm, SUV 7.78. - NGS testing: PD-L1 negative, TMB-low, MSI-stable, K-ras G12 D.  No other targetable mutations. - Carboplatin, pemetrexed and pembrolizumab 4 cycles from 09/22/2021 through 11/25/2021, followed by maintenance pemetrexed and pembrolizumab -  CT CAP (11/10/2021): Reduced size of liver mass and interval resolution of previously seen groundglass pulmonary nodules.  There is a new inflammatory right upper lobe nodule.    Plan: 1.  Metastatic adenocarcinoma of the lung to the liver and  right lung: - She is tolerating pembrolizumab and pemetrexed reasonably well. - Reviewed CT chest from 10/09/2022: Right axillary lymph node measures 9 mm, previously 3 mm.  Possibly reactive.  Left perihilar postradiation changes stable.  Slight increase in left pleural effusion.  Stable right upper lobe lung nodule. - Right axillary lymph node is not palpable.  Most likely reactive.  Hence have recommended continuing 20% dose reduced pemetrexed and pembrolizumab every 3 weeks.  RTC 6 weeks for follow-up.    2.  Meningioma: - Reviewed MRI brain from 10/02/2022: Stable left sphenoid wing meningioma.  3.  Unprovoked pulmonary embolism: - Continue Eliquis 5 mg daily indefinitely.  No bleeding issues.   4.  Leg swellings: - She is currently taking Lasix 40 mg daily.  We will decrease his Lasix to 20 mg daily as she complains of occasional dizziness when standing.  Blood pressure today is 100/49.   5.  Normocytic anemia: - Stool cards were positive.  She underwent colonoscopy on 09/25/2022 which showed nonbleeding internal hemorrhoids, diverticulosis, 2 mm polyp in the transverse colon.  Hemoglobin today is 9.4.  Will closely monitor.    No orders of the defined types were placed in this encounter.     Alben Deeds Teague,acting as a Neurosurgeon for Doreatha Massed, MD.,have documented all relevant documentation on the behalf of Doreatha Massed, MD,as directed by  Doreatha Massed, MD while in the presence of Doreatha Massed, MD.  I, Doreatha Massed MD, have reviewed the above documentation for accuracy and completeness, and I agree with the above.    Doreatha Massed, MD   8/1/20247:11 PM  CHIEF COMPLAINT:   Diagnosis: stage IV adenocarcinoma  of the left lung, negative for targetable mutations    Cancer Staging  Adenocarcinoma of left lung [facility]) Staging form: Lung, AJCC 8th Edition - Clinical stage from 01/02/2019: Stage IIIB (cT3, cN2, cM0) - Signed by Doreatha Massed, MD on 01/02/2019 - Pathologic stage from 09/07/2021: Stage IVA (pTX, pNX, pM1b) - Unsigned    Prior Therapy: 1. Chemoradiation with carboplatin and paclitaxel from 01/07/2019 to 02/11/2019. 2. Consolidation with durvalumab from 03/19/2019 to 04/16/2019, held due to pneumonitis.  Current Therapy:  Pemetrexed and pembrolizumab maintenance    HISTORY OF PRESENT ILLNESS:   Oncology History  Adenocarcinoma of left lung (HCC)  12/09/2018 Initial Diagnosis   Adenocarcinoma of left lung (HCC)   01/02/2019 Cancer Staging   Staging form: Lung, AJCC 8th Edition - Clinical stage from 01/02/2019: Stage IIIB (cT3, cN2, cM0) - Signed by Doreatha Massed, MD on 01/02/2019   01/07/2019 - 02/11/2019 Chemotherapy   The patient had palonosetron (ALOXI) injection 0.25 mg, 0.25 mg, Intravenous,  Once, 6 of 6 cycles Administration: 0.25 mg (01/07/2019), 0.25 mg (01/14/2019), 0.25 mg (01/21/2019), 0.25 mg (01/28/2019), 0.25 mg (02/04/2019), 0.25 mg (02/11/2019) CARBOplatin (PARAPLATIN) 270 mg in sodium chloride 0.9 % 250 mL chemo infusion, 270 mg (100 % of original dose 266.4 mg), Intravenous,  Once, 6 of 6 cycles Dose modification:   (original dose 266.4 mg, Cycle 1),   (original dose 266.4 mg, Cycle 2),   (original dose 266.4 mg, Cycle 3),   (original dose 266.4 mg, Cycle 4) Administration: 270 mg (01/07/2019), 270 mg (01/14/2019), 270 mg (01/21/2019), 270 mg (01/28/2019), 270 mg (02/04/2019), 270 mg (02/11/2019) PACLitaxel (TAXOL) 108 mg in sodium chloride 0.9 % 250 mL chemo infusion (</= 80mg /m2), 45 mg/m2 = 108 mg, Intravenous,  Once, 6 of 6 cycles Administration: 108 mg (01/07/2019), 108 mg (01/14/2019), 108 mg (01/21/2019), 108 mg (01/28/2019), 108 mg (02/04/2019), 108 mg (02/11/2019) fosaprepitant (EMEND) 150 mg, dexamethasone (DECADRON) 12 mg in sodium chloride 0.9 % 145 mL IVPB, , Intravenous,  Once, 5 of 5 cycles Administration:  (01/14/2019),  (01/21/2019),  (01/28/2019),  (02/04/2019),   (02/11/2019)  for chemotherapy treatment.    03/19/2019 - 07/23/2020 Chemotherapy   Patient is on Treatment Plan : LUNG DURVALUMAB Q14D     09/22/2021 - 11/04/2021 Chemotherapy   Patient is on Treatment Plan : LUNG Carboplatin (5) + Pemetrexed (500) + Pembrolizumab (200) D1 q21d Induction x 4 cycles / Maintenance Pemetrexed (500) + Pembrolizumab (200) D1 q21d     09/22/2021 -  Chemotherapy   Patient is on Treatment Plan : LUNG Carboplatin (5) + Pemetrexed (500) + Pembrolizumab (200) D1 q21d Induction x 4 cycles / Maintenance Pemetrexed (500) + Pembrolizumab (200) D1 q21d        INTERVAL HISTORY:   Denise [name] is a 72 y.o. female presenting to clinic today for follow up of stage IV adenocarcinoma of the left lung. She was last seen by me on 09/18/22.  Since her last visit, she underwent a chest CT on 7/22 that found: interval enlargement of a subcentimeter right axillary lymph node, possibly reactive, although metastatic disease can not be excluded; left perihilar postradiation changes appears stable; stable right upper lobe pulmonary nodule with no new or enlarging pulmonary nodules; moderate left pleural effusion, increased in size when compared with the prior exam; new trace right pleural effusion; and stable irregular lesion of the right hepatic lobe. She had esophagogastroduodenoscopy on 09/25/22 with Dr. Marletta Lor. Surgical pathology of the stomach pouch biopsy revealed: gastric oxyntic mucosa with reactive epithelial  changes and iron deposit compatible with iron pill gastritis, no H. pylori identified on HE, and negative for intestinal metaplasia or dysplasia. The transverse colon revealed: tubular adenoma negative for high-grade dysplasia.   Today, she states that she is doing well overall. Her appetite level is at 100%. Her energy level is at 60%.  PAST MEDICAL HISTORY:   Past Medical History: Past Medical History:  Diagnosis Date   Anemia    Aortic stenosis    Arthritis    Coronary artery  calcification seen on CT scan    Dyspnea    Essential hypertension    GERD (gastroesophageal reflux disease)    History of lung cancer    Stage III adenocarcinoma status post chemoradiation   Port-A-Cath in place 01/06/2019   Type 2 diabetes mellitus [facility])     Surgical History: Past Surgical History:  Procedure Laterality Date   BIOPSY  09/25/2022   Procedure: BIOPSY;  Surgeon: Lanelle Bal, DO;  Location: [facility];  Service: Endoscopy;;   CHOLECYSTECTOMY  1997   COLONOSCOPY     COLONOSCOPY WITH PROPOFOL N/A 09/25/2022   Procedure: COLONOSCOPY WITH PROPOFOL;  Surgeon: Lanelle Bal, DO;  Location: [facility];  Service: Endoscopy;  Laterality: N/A;  11:00;ASA 3   ESOPHAGEAL BRUSHING  09/25/2022   Procedure: ESOPHAGEAL BRUSHING;  Surgeon: Lanelle Bal, DO;  Location: [facility];  Service: Endoscopy;;   ESOPHAGOGASTRODUODENOSCOPY (EGD) WITH PROPOFOL N/A 09/25/2022   Procedure: ESOPHAGOGASTRODUODENOSCOPY (EGD) WITH PROPOFOL;  Surgeon: Lanelle Bal, DO;  Location: [facility];  Service: Endoscopy;  Laterality: N/A;  11:00AM;ASA 3   GASTRIC BYPASS     INCISIONAL HERNIA REPAIR  04/11/11   IR IMAGING GUIDED PORT INSERTION  12/27/2018   Right   IR US GUIDE BX ASP/DRAIN  08/08/2021   LAPAROSCOPIC SALPINGOOPHERECTOMY     LAPAROTOMY  04/11/2011   Procedure: EXPLORATORY LAPAROTOMY;  Surgeon: Clovis Pu. Cornett, MD;  Location: [facility];  Service: General;  Laterality: N/A;  closure port hole   POLYPECTOMY  09/25/2022   Procedure: POLYPECTOMY;  Surgeon: Lanelle Bal, DO;  Location: [facility];  Service: Endoscopy;;   RIGHT/LEFT HEART CATH AND CORONARY ANGIOGRAPHY N/A 12/29/2019   Procedure: RIGHT/LEFT HEART CATH AND CORONARY ANGIOGRAPHY;  Surgeon: Lennette Bihari, MD;  Location: [facility];  Service: Cardiovascular;  Laterality: N/A;   TOTAL HIP ARTHROPLASTY  03/07/2012   Procedure: TOTAL HIP ARTHROPLASTY ANTERIOR APPROACH;  Surgeon: Shelda Pal, MD;  Location:  [facility];  Service: Orthopedics;  Laterality: Right;   TOTAL SHOULDER ARTHROPLASTY Left 01/21/2015   TOTAL SHOULDER ARTHROPLASTY Left 01/21/2015   Procedure: LEFT TOTAL SHOULDER ARTHROPLASTY;  Surgeon: Francena Hanly, MD;  Location: [facility] OR;  Service: Orthopedics;  Laterality: Left;   VAGINAL HYSTERECTOMY      Social History: Social History   Socioeconomic History   Marital status: Single    Spouse name: Not on file   Number of children: Not on file   Years of education: 12th grade   Highest education level: Not on file  Occupational History   Occupation: Employed    Employer: [employer]  Tobacco Use   Smoking status: Former    Current packs/day: 0.00    Average packs/day: 1 pack/day for 12.0 years (12.0 ttl pk-yrs)    Types: Cigarettes    Start date: 03/20/1964    Quit date: 03/20/1976    Years since quitting: 46.6    Passive exposure: Past  Smokeless tobacco: Never  Vaping Use   Vaping status: Never Used  Substance and Sexual Activity   Alcohol use: No   Drug use: No   Sexual activity: Not Currently  Other Topics Concern   Not on file  Social History Narrative   Not on file   Social Determinants of Health   Financial Resource Strain: Low Risk  (12/06/2018)   Overall Financial Resource Strain (CARDIA)    Difficulty of Paying Living Expenses: Not very hard  Food Insecurity: No Food Insecurity (12/09/2018)   Received from [facility], [facility]   Hunger Vital Sign    Worried About Running Out of Food in the Last Year: Never true    Ran Out of Food in the Last Year: Never true  Transportation Needs: No Transportation Needs (12/09/2018)   Received from [facility], [facility]   [facility] - Transportation    Lack of Transportation (Medical): No    Lack of Transportation (Non-Medical): No  Physical Activity: Inactive (12/06/2018)   Exercise Vital Sign    Days of Exercise per Week: 0 days    Minutes of Exercise per Session: 0 min  Stress: No Stress  Concern Present (12/09/2018)   Received from [facility], [facility] - Occupational Stress Questionnaire    Feeling of Stress : Not at all  Social Connections: Moderately Isolated (12/06/2018)   Social Connection and Isolation Panel [NHANES]    Frequency of Communication with Friends and Family: More than three times a week    Frequency of Social Gatherings with Friends and Family: Once a week    Attends Religious Services: More than 4 times per year    Active Member of [employer] or Organizations: No    Attends Banker Meetings: Never    Marital Status: Never married  Intimate Partner Violence: Not At Risk (12/29/2020)   Received from [facility], [facility]   Humiliation, Afraid, Rape, and Kick questionnaire    Fear of Current or Ex-Partner: No    Emotionally Abused: No    Physically Abused: No    Sexually Abused: No    Family History: Family History  Problem Relation Age of Onset   Breast cancer Mother    COPD Mother    Arthritis Mother    Diabetes Mother    Hypertension Mother    Hypertension Father    Diabetes Father    Breast cancer Sister    Thyroid cancer Brother    Huntington's disease Maternal Grandmother    Heart attack Brother     Current Medications:  Current Outpatient Medications:    Calcium Carbonate Antacid (TUMS PO), Take 4 tablets by mouth daily., Disp: , Rfl:    carvedilol (COREG) 3.125 MG tablet, TAKE 1 TABLET BY MOUTH TWICE A DAY, Disp: 180 tablet, Rfl: 2   Cyanocobalamin (VITAMIN B-12) 2500 MCG SUBL, Place 2,500 mcg under the tongue every morning. , Disp: , Rfl:    diphenhydrAMINE (BENADRYL) 25 MG tablet, Take 25 mg by mouth every 6 (six) hours as needed for itching or allergies., Disp: , Rfl:    ELIQUIS 5 MG TABS tablet, TAKE 1 TABLET BY MOUTH TWICE A DAY, Disp: 60 tablet, Rfl: 6   ferrous sulfate 325 (65 FE) MG tablet, Take 325 mg by mouth every evening., Disp: , Rfl:     fluticasone (FLONASE) 50 MCG/ACT nasal spray, Place 1 spray into both nostrils 2 (  two) times daily., Disp: 16 g, Rfl: 2   folic acid (FOLVITE) 1 MG tablet, TAKE 1 TABLET BY MOUTH EVERY DAY, Disp: 90 tablet, Rfl: 1   furosemide (LASIX) 20 MG tablet, Take 40 mg by mouth daily., Disp: , Rfl:    gabapentin (NEURONTIN) 300 MG capsule, Take 300 mg by mouth at bedtime., Disp: , Rfl:    Multiple Vitamin (MULITIVITAMIN WITH MINERALS) TABS, Take 1 tablet by mouth daily., Disp: , Rfl:    omeprazole (PRILOSEC) 20 MG capsule, Take 20 mg by mouth daily., Disp: , Rfl:    Potassium Chloride ER 20 MEQ TBCR, Take 40 mEq by mouth daily., Disp: , Rfl:  No current facility-administered medications for this visit.  Facility-Administered Medications Ordered in Other Visits:    magnesium sulfate 2 GM/50ML IVPB, , , ,    sodium chloride flush (NS) 0.9 % injection 10 mL, 10 mL, Intracatheter, PRN, Doreatha Massed, MD, 10 mL at 10/19/22 1354   Allergies: No Known Allergies  REVIEW OF SYSTEMS:   Review of Systems  Constitutional:  Negative for chills, fatigue and fever.  HENT:   Negative for lump/mass, mouth sores, nosebleeds, sore throat and trouble swallowing.   Eyes:  Negative for eye problems.  Respiratory:  Positive for shortness of breath. Negative for cough.   Cardiovascular:  Negative for chest pain, leg swelling and palpitations.  Gastrointestinal:  Negative for abdominal pain, constipation, diarrhea, nausea and vomiting.  Genitourinary:  Negative for bladder incontinence, difficulty urinating, dysuria, frequency, hematuria and nocturia.   Musculoskeletal:  Negative for arthralgias, back pain, flank pain, myalgias and neck pain.  Skin:  Negative for itching and rash.  Neurological:  Positive for dizziness and numbness. Negative for headaches.  Hematological:  Does not bruise/bleed easily.  Psychiatric/Behavioral:  Negative for depression, sleep disturbance and suicidal ideas. The patient is not  nervous/anxious.   All other systems reviewed and are negative.    VITALS:   Weight 231 lb 9.6 oz (105.1 kg).  Wt Readings from Last 3 Encounters:  10/19/22 231 lb 9.6 oz (105.1 kg)  10/09/22 231 lb 6.4 oz (105 kg)  09/25/22 229 lb 3.2 oz (104 kg)    Body mass index is 42.36 kg/m.  Performance status (ECOG): 1 - Symptomatic but completely ambulatory  PHYSICAL EXAM:   Physical Exam Vitals and nursing note reviewed. Exam conducted with a chaperone present.  Constitutional:      Appearance: Normal appearance.  Cardiovascular:     Rate and Rhythm: Normal rate and regular rhythm.     Pulses: Normal pulses.     Heart sounds: Normal heart sounds.  Pulmonary:     Effort: Pulmonary effort is normal.     Breath sounds: Normal breath sounds.  Abdominal:     Palpations: Abdomen is soft. There is no hepatomegaly, splenomegaly or mass.     Tenderness: There is no abdominal tenderness.  Musculoskeletal:     Right lower leg: No edema.     Left lower leg: No edema.  Lymphadenopathy:     Cervical: No cervical adenopathy.     Right cervical: No superficial, deep or posterior cervical adenopathy.    Left cervical: No superficial, deep or posterior cervical adenopathy.     Upper Body:     Right upper body: No supraclavicular or axillary adenopathy.     Left upper body: No supraclavicular or axillary adenopathy.  Neurological:     General: No focal deficit present.     Mental Status: She is  alert and oriented to person, place, and time.  Psychiatric:        Mood and Affect: Mood normal.        Behavior: Behavior normal.     LABS:      Latest Ref Rng & Units 10/19/2022    9:42 AM 10/10/2022    8:29 AM 10/09/2022   11:19 AM  CBC  WBC 4.0 - 10.5 K/uL 6.4  6.4  6.0   Hemoglobin 12.0 - 15.0 g/dL 9.4  7.3  7.3   Hematocrit 36.0 - 46.0 % 31.1  24.9  24.6   Platelets 150 - 400 K/uL 360  464  517       Latest Ref Rng & Units 10/19/2022    9:42 AM 10/09/2022   11:19 AM 09/18/2022   10:52  AM  CMP  Glucose 70 - 99 mg/dL 956  213  086   BUN 8 - 23 mg/dL 10  8  9    Creatinine 0.44 - 1.00 mg/dL 5.78  4.69  6.29   Sodium 135 - 145 mmol/L 138  138  139   Potassium 3.5 - 5.1 mmol/L 3.9  3.7  3.7   Chloride 98 - 111 mmol/L 105  104  105   CO2 22 - 32 mmol/L 26  26  23    Calcium 8.9 - 10.3 mg/dL 8.1  8.1  8.5   Total Protein 6.5 - 8.1 g/dL 5.8  6.3  6.5   Total Bilirubin 0.3 - 1.2 mg/dL 0.3  0.4  0.3   Alkaline Phos 38 - 126 U/L 78  92  92   AST 15 - 41 U/L 22  15  20    ALT 0 - 44 U/L 19  11  12       No results found for: "CEA1", "CEA" / No results found for: "CEA1", "CEA" No results found for: "PSA1" No results found for: "BMW413" No results found for: "CAN125"  No results found for: "TOTALPROTELP", "ALBUMINELP", "A1GS", "A2GS", "BETS", "BETA2SER", "GAMS", "MSPIKE", "SPEI" Lab Results  Component Value Date   TIBC 215 (L) 09/18/2022   TIBC 227 (L) 06/05/2022   FERRITIN 596 (H) 09/18/2022   FERRITIN 310 (H) 06/05/2022   IRONPCTSAT 15 09/18/2022   IRONPCTSAT 17 06/05/2022   Lab Results  Component Value Date   LDH 237 (H) 07/17/2022     STUDIES:   MR BRAIN W WO CONTRAST  Result Date: 10/09/2022 CLINICAL DATA:  Meningioma. EXAM: MRI HEAD WITHOUT AND WITH CONTRAST TECHNIQUE: Multiplanar, multiecho pulse sequences of the brain and surrounding structures were obtained without and with intravenous contrast. CONTRAST:  10mL GADAVIST GADOBUTROL 1 MMOL/ML IV SOLN COMPARISON:  MRI of the head without and with contrast 04/03/2022 and 12/16/2018. FINDINGS: Brain: The meningioma of the left middle cranial fossa is stable in size measuring 2.6 x 2.0 x 2.5 cm. No new meningeal lesions are present. Acute infarct or hemorrhage is present. No other mass lesion is present. The ventricles are of normal size. No significant extraaxial fluid collection is present. The brainstem and cerebellum are within normal limits. The internal auditory canals are within normal limits. An enlarged empty  sella is again noted. Midline structures are otherwise within normal limits. Vascular: Flow is present in the major intracranial arteries. Skull and upper cervical spine: The craniocervical junction is normal. Upper cervical spine is within normal limits. Marrow signal is unremarkable. Sinuses/Orbits: Small mastoid effusions are chronic scratched at small chronic mastoid effusions are present. The paranasal sinuses are  clear. The globes and orbits are within normal limits. IMPRESSION: 1. Stable left sphenoid wing meningioma. 2. No acute intracranial abnormality or significant interval change. Electronically Signed   By: Marin Roberts M.D.   On: 10/09/2022 19:34   CT Chest W Contrast  Result Date: 10/09/2022 CLINICAL DATA:  Non-small cell lung cancer; * Tracking Code: BO * EXAM: CT CHEST WITH CONTRAST TECHNIQUE: Multidetector CT imaging of the chest was performed during intravenous contrast administration. RADIATION DOSE REDUCTION: This exam was performed according to the departmental dose-optimization program which includes automated exposure control, adjustment of the mA and/or kV according to patient size and/or use of iterative reconstruction technique. CONTRAST:  80mL OMNIPAQUE IOHEXOL 300 MG/ML  SOLN COMPARISON:  Multiple priors, most recent CT chest, abdomen and pelvis dated Aug 15, 2022 FINDINGS: Cardiovascular: Normal heart size. Trace pericardial effusion. Normal caliber thoracic aorta with mild atherosclerotic disease. Severe aortic valve calcifications. No coronary artery calcifications. Right chest wall port with tip in the right atrium. Mediastinum/Nodes: Esophagus and thyroid are unremarkable. Axillary lymph node measuring 9 mm on series 2 image 40, previously 3 mm. No enlarging mediastinal or hilar lymph nodes. Lungs/Pleura: Central airways are patent. Unchanged mild patchy ground-glass opacities. Left perihilar postradiation change appears stable, although findings are somewhat obscured by  pleural effusion. Irregular subpleural solid nodule of the right upper lobe measuring 14 x 12 mm on series 4, image 56, previously 14 x 12 mm. No new or enlarging pulmonary nodules. Moderate left pleural effusion, increased in size when compared with the prior exam. New trace right pleural effusion. Upper Abdomen: Irregular lesion of the right hepatic lobe measuring 3.6 x 4.0 cm appears unchanged in size when compared with the prior exam, although differences in contrast timing somewhat limits evaluation. Unchanged biliary ductal dilation. Postsurgical changes of the stomach. Musculoskeletal: No chest wall abnormality. No acute or significant osseous findings. IMPRESSION: 1. Interval enlargement of a subcentimeter right axillary lymph node, possibly reactive, although metastatic disease can not be excluded. Right axillary ultrasound could assist with further evaluation. 2. Left perihilar postradiation changes appears stable, although findings are somewhat obscured by pleural effusion. 3. Stable right upper lobe pulmonary nodule. No new or enlarging pulmonary nodules. 4. Moderate left pleural effusion, increased in size when compared with the prior exam. New trace right pleural effusion. 5. Stable irregular lesion of the right hepatic lobe. 6. Aortic Atherosclerosis (ICD10-I70.0). Electronically Signed   By: Allegra Lai M.D.   On: 10/09/2022 15:12

## 2022-10-19 ENCOUNTER — Inpatient Hospital Stay: Payer: Medicare HMO | Attending: Neurosurgery

## 2022-10-19 ENCOUNTER — Inpatient Hospital Stay (HOSPITAL_BASED_OUTPATIENT_CLINIC_OR_DEPARTMENT_OTHER): Payer: Medicare HMO | Admitting: Hematology

## 2022-10-19 ENCOUNTER — Inpatient Hospital Stay: Payer: Medicare HMO

## 2022-10-19 VITALS — BP 109/56 | HR 74 | Temp 96.2°F | Resp 20

## 2022-10-19 VITALS — BP 100/49 | HR 83 | Temp 97.3°F | Resp 18

## 2022-10-19 DIAGNOSIS — C3492 Malignant neoplasm of unspecified part of left bronchus or lung: Secondary | ICD-10-CM

## 2022-10-19 DIAGNOSIS — Z7901 Long term (current) use of anticoagulants: Secondary | ICD-10-CM | POA: Insufficient documentation

## 2022-10-19 DIAGNOSIS — J90 Pleural effusion, not elsewhere classified: Secondary | ICD-10-CM | POA: Insufficient documentation

## 2022-10-19 DIAGNOSIS — D649 Anemia, unspecified: Secondary | ICD-10-CM | POA: Insufficient documentation

## 2022-10-19 DIAGNOSIS — C3412 Malignant neoplasm of upper lobe, left bronchus or lung: Secondary | ICD-10-CM | POA: Insufficient documentation

## 2022-10-19 DIAGNOSIS — Z95828 Presence of other vascular implants and grafts: Secondary | ICD-10-CM

## 2022-10-19 DIAGNOSIS — C787 Secondary malignant neoplasm of liver and intrahepatic bile duct: Secondary | ICD-10-CM | POA: Insufficient documentation

## 2022-10-19 DIAGNOSIS — Z5112 Encounter for antineoplastic immunotherapy: Secondary | ICD-10-CM | POA: Diagnosis not present

## 2022-10-19 DIAGNOSIS — Z79620 Long term (current) use of immunosuppressive biologic: Secondary | ICD-10-CM | POA: Insufficient documentation

## 2022-10-19 DIAGNOSIS — D329 Benign neoplasm of meninges, unspecified: Secondary | ICD-10-CM | POA: Diagnosis not present

## 2022-10-19 DIAGNOSIS — I2699 Other pulmonary embolism without acute cor pulmonale: Secondary | ICD-10-CM | POA: Diagnosis not present

## 2022-10-19 DIAGNOSIS — Z5111 Encounter for antineoplastic chemotherapy: Secondary | ICD-10-CM | POA: Insufficient documentation

## 2022-10-19 DIAGNOSIS — D123 Benign neoplasm of transverse colon: Secondary | ICD-10-CM | POA: Diagnosis not present

## 2022-10-19 LAB — COMPREHENSIVE METABOLIC PANEL
ALT: 19 U/L (ref 0–44)
AST: 22 U/L (ref 15–41)
Albumin: 2.4 g/dL — ABNORMAL LOW (ref 3.5–5.0)
Alkaline Phosphatase: 78 U/L (ref 38–126)
Anion gap: 7 (ref 5–15)
BUN: 10 mg/dL (ref 8–23)
CO2: 26 mmol/L (ref 22–32)
Calcium: 8.1 mg/dL — ABNORMAL LOW (ref 8.9–10.3)
Chloride: 105 mmol/L (ref 98–111)
Creatinine, Ser: 0.84 mg/dL (ref 0.44–1.00)
GFR, Estimated: >60 mL/min (ref >60)
Glucose, Bld: 108 mg/dL — ABNORMAL HIGH (ref 70–99)
Potassium: 3.9 mmol/L (ref 3.5–5.1)
Sodium: 138 mmol/L (ref 135–145)
Total Bilirubin: 0.3 mg/dL (ref 0.3–1.2)
Total Protein: 5.8 g/dL — ABNORMAL LOW (ref 6.5–8.1)

## 2022-10-19 LAB — CBC WITH DIFFERENTIAL/PLATELET
Abs Immature Granulocytes: 0.01 10*3/uL (ref 0.00–0.07)
Basophils Absolute: 0 10*3/uL (ref 0.0–0.1)
Basophils Relative: 1 %
Eosinophils Absolute: 0.2 10*3/uL (ref 0.0–0.5)
Eosinophils Relative: 3 %
HCT: 31.1 % — ABNORMAL LOW (ref 36.0–46.0)
Hemoglobin: 9.4 g/dL — ABNORMAL LOW (ref 12.0–15.0)
Immature Granulocytes: 0 %
Lymphocytes Relative: 14 %
Lymphs Abs: 0.9 10*3/uL (ref 0.7–4.0)
MCH: 29.2 pg (ref 26.0–34.0)
MCHC: 30.2 g/dL (ref 30.0–36.0)
MCV: 96.6 fL (ref 80.0–100.0)
Monocytes Absolute: 0.7 10*3/uL (ref 0.1–1.0)
Monocytes Relative: 11 %
Neutro Abs: 4.6 10*3/uL (ref 1.7–7.7)
Neutrophils Relative %: 71 %
Platelets: 360 10*3/uL (ref 150–400)
RBC: 3.22 MIL/uL — ABNORMAL LOW (ref 3.87–5.11)
RDW: 19.2 % — ABNORMAL HIGH (ref 11.5–15.5)
WBC: 6.4 10*3/uL (ref 4.0–10.5)
nRBC: 0 % (ref 0.0–0.2)

## 2022-10-19 LAB — TSH: TSH: 4.478 u[IU]/mL (ref 0.350–4.500)

## 2022-10-19 LAB — MAGNESIUM: Magnesium: 1.8 mg/dL (ref 1.7–2.4)

## 2022-10-19 MED ORDER — SODIUM CHLORIDE 0.9 % IV SOLN
200.0000 mg | Freq: Once | INTRAVENOUS | Status: AC
  Administered 2022-10-19: 200 mg via INTRAVENOUS
  Filled 2022-10-19: qty 8

## 2022-10-19 MED ORDER — SODIUM CHLORIDE 0.9% FLUSH
10.0000 mL | INTRAVENOUS | Status: DC | PRN
  Administered 2022-10-19: 10 mL

## 2022-10-19 MED ORDER — SODIUM CHLORIDE 0.9 % IV SOLN
400.0000 mg/m2 | Freq: Once | INTRAVENOUS | Status: AC
  Administered 2022-10-19: 900 mg via INTRAVENOUS
  Filled 2022-10-19: qty 20

## 2022-10-19 MED ORDER — SODIUM CHLORIDE 0.9% FLUSH
10.0000 mL | INTRAVENOUS | Status: DC | PRN
  Administered 2022-10-19: 10 mL via INTRAVENOUS

## 2022-10-19 MED ORDER — CYANOCOBALAMIN 1000 MCG/ML IJ SOLN
1000.0000 ug | Freq: Once | INTRAMUSCULAR | Status: AC
  Administered 2022-10-19: 1000 ug via INTRAMUSCULAR
  Filled 2022-10-19: qty 1

## 2022-10-19 MED ORDER — SODIUM CHLORIDE 0.9 % IV SOLN: Freq: Once | INTRAVENOUS | Status: AC

## 2022-10-19 MED ORDER — HEPARIN SOD (PORK) LOCK FLUSH 100 UNIT/ML IV SOLN
500.0000 [IU] | Freq: Once | INTRAVENOUS | Status: AC | PRN
  Administered 2022-10-19: 500 [IU]

## 2022-10-19 MED ORDER — ONDANSETRON HCL 4 MG/2ML IJ SOLN
8.0000 mg | Freq: Once | INTRAMUSCULAR | Status: AC
  Administered 2022-10-19: 8 mg via INTRAVENOUS
  Filled 2022-10-19: qty 4

## 2022-10-19 NOTE — Patient Instructions (Signed)
[facility]  Discharge Instructions: Thank you for choosing  Cancer Center to provide your oncology and hematology care.  If you have a lab appointment with the Cancer Center - please note that after April 8th, 2024, all labs will be drawn in the cancer center.  You do not have to check in or register with the main entrance as you have in the past but will complete your check-in in the cancer center.  Wear comfortable clothing and clothing appropriate for easy access to any Portacath or PICC line.   We strive to give you quality time with your provider. You may need to reschedule your appointment if you arrive late (15 or more minutes).  Arriving late affects you and other patients whose appointments are after yours.  Also, if you miss three or more appointments without notifying the office, you may be dismissed from the clinic at the provider's discretion.      For prescription refill requests, have your pharmacy contact our office and allow 72 hours for refills to be completed.    Today you received the following chemotherapy and/or immunotherapy agents Keytruda and Alimta   To help prevent nausea and vomiting after your treatment, we encourage you to take your nausea medication as directed.  Pembrolizumab Injection What is this medication? PEMBROLIZUMAB (PEM broe LIZ ue mab) treats some types of cancer. It works by helping your immune system slow or stop the spread of cancer cells. It is a monoclonal antibody. This medicine may be used for other purposes; ask your health care provider or pharmacist if you have questions. COMMON BRAND NAME(S): Keytruda What should I tell my care team before I take this medication? They need to know if you have any of these conditions: Allogeneic stem cell transplant (uses someone else's stem cells) Autoimmune diseases, such as Crohn disease, ulcerative colitis, lupus History of chest radiation Nervous system problems, such  as Guillain-Barre syndrome, myasthenia gravis Organ transplant An unusual or allergic reaction to pembrolizumab, other medications, foods, dyes, or preservatives Pregnant or trying to get pregnant Breast-feeding How should I use this medication? This medication is injected into a vein. It is given by your care team in a hospital or clinic setting. A special MedGuide will be given to you before each treatment. Be sure to read this information carefully each time. Talk to your care team about the use of this medication in children. While it may be prescribed for children as young as 6 months for selected conditions, precautions do apply. Overdosage: If you think you have taken too much of this medicine contact a poison control center or emergency room at once. NOTE: This medicine is only for you. Do not share this medicine with others. What if I miss a dose? Keep appointments for follow-up doses. It is important not to miss your dose. Call your care team if you are unable to keep an appointment. What may interact with this medication? Interactions have not been studied. This list may not describe all possible interactions. Give your health care provider a list of all the medicines, herbs, non-prescription drugs, or dietary supplements you use. Also tell them if you smoke, drink alcohol, or use illegal drugs. Some items may interact with your medicine. What should I watch for while using this medication? Your condition will be monitored carefully while you are receiving this medication. You may need blood work while taking this medication. This medication may cause serious skin reactions. They can happen weeks  to months after starting the medication. Contact your care team right away if you notice fevers or flu-like symptoms with a rash. The rash may be red or purple and then turn into blisters or peeling of the skin. You may also notice a red rash with swelling of the face, lips, or lymph nodes in  your neck or under your arms. Tell your care team right away if you have any change in your eyesight. Talk to your care team if you may be pregnant. Serious birth defects can occur if you take this medication during pregnancy and for 4 months after the last dose. You will need a negative pregnancy test before starting this medication. Contraception is recommended while taking this medication and for 4 months after the last dose. Your care team can help you find the option that works for you. Do not breastfeed while taking this medication and for 4 months after the last dose. What side effects may I notice from receiving this medication? Side effects that you should report to your care team as soon as possible: Allergic reactions--skin rash, itching, hives, swelling of the face, lips, tongue, or throat Dry cough, shortness of breath or trouble breathing Eye pain, redness, irritation, or discharge with blurry or decreased vision Heart muscle inflammation--unusual weakness or fatigue, shortness of breath, chest pain, fast or irregular heartbeat, dizziness, swelling of the ankles, feet, or hands Hormone gland problems--headache, sensitivity to light, unusual weakness or fatigue, dizziness, fast or irregular heartbeat, increased sensitivity to cold or heat, excessive sweating, constipation, hair loss, increased thirst or amount of urine, tremors or shaking, irritability Infusion reactions--chest pain, shortness of breath or trouble breathing, feeling faint or lightheaded Kidney injury (glomerulonephritis)--decrease in the amount of urine, red or dark brown urine, foamy or bubbly urine, swelling of the ankles, hands, or feet Liver injury--right upper belly pain, loss of appetite, nausea, light-colored stool, dark yellow or brown urine, yellowing skin or eyes, unusual weakness or fatigue Pain, tingling, or numbness in the hands or feet, muscle weakness, change in vision, confusion or trouble speaking, loss of  balance or coordination, trouble walking, seizures Rash, fever, and swollen lymph nodes Redness, blistering, peeling, or loosening of the skin, including inside the mouth Sudden or severe stomach pain, bloody diarrhea, fever, nausea, vomiting Side effects that usually do not require medical attention (report to your care team if they continue or are bothersome): Bone, joint, or muscle pain Diarrhea Fatigue Loss of appetite Nausea Skin rash This list may not describe all possible side effects. Call your doctor for medical advice about side effects. You may report side effects to FDA at 1-800-FDA-1088. Where should I keep my medication? This medication is given in a hospital or clinic. It will not be stored at home. NOTE: This sheet is a summary. It may not cover all possible information. If you have questions about this medicine, talk to your doctor, pharmacist, or health care provider.  2024 Elsevier/Gold Standard (2021-07-19 00:00:00)  Pemetrexed Injection What is this medication? PEMETREXED (PEM e TREX ed) treats some types of cancer. It works by slowing down the growth of cancer cells. This medicine may be used for other purposes; ask your health care provider or pharmacist if you have questions. COMMON BRAND NAME(S): Alimta, PEMFEXY, PEMRYDI RTU What should I tell my care team before I take this medication? They need to know if you have any of these conditions: Infection, such as chickenpox, cold sores, or herpes Kidney disease Low blood cell levels (white  cells, red cells, and platelets) Lung or breathing disease, such as asthma Radiation therapy An unusual or allergic reaction to pemetrexed, other medications, foods, dyes, or preservatives If you or your partner are pregnant or trying to get pregnant Breast-feeding How should I use this medication? This medication is injected into a vein. It is given by your care team in a hospital or clinic setting. Talk to your care team  about the use of this medication in children. Special care may be needed. Overdosage: If you think you have taken too much of this medicine contact a poison control center or emergency room at once. NOTE: This medicine is only for you. Do not share this medicine with others. What if I miss a dose? Keep appointments for follow-up doses. It is important not to miss your dose. Call your care team if you are unable to keep an appointment. What may interact with this medication? Do not take this medication with any of the following: Live virus vaccines This medication may also interact with the following: Ibuprofen This list may not describe all possible interactions. Give your health care provider a list of all the medicines, herbs, non-prescription drugs, or dietary supplements you use. Also tell them if you smoke, drink alcohol, or use illegal drugs. Some items may interact with your medicine. What should I watch for while using this medication? Your condition will be monitored carefully while you are receiving this medication. This medication may make you feel generally unwell. This is not uncommon as chemotherapy can affect healthy cells as well as cancer cells. Report any side effects. Continue your course of treatment even though you feel ill unless your care team tells you to stop. This medication can cause serious side effects. To reduce the risk, your care team may give you other medications to take before receiving this one. Be sure to follow the directions from your care team. This medication can cause a rash or redness in areas of the body that have previously had radiation therapy. If you have had radiation therapy, tell your care team if you notice a rash in this area. This medication may increase your risk of getting an infection. Call your care team for advice if you get a fever, chills, sore throat, or other symptoms of a cold or flu. Do not treat yourself. Try to avoid being around  people who are sick. Be careful brushing or flossing your teeth or using a toothpick because you may get an infection or bleed more easily. If you have any dental work done, tell your dentist you are receiving this medication. Avoid taking medications that contain aspirin, acetaminophen, ibuprofen, naproxen, or ketoprofen unless instructed by your care team. These medications may hide a fever. Check with your care team if you have severe diarrhea, nausea, and vomiting, or if you sweat a lot. The loss of too much body fluid may make it dangerous for you to take this medication. Talk to your care team if you or your partner wish to become pregnant or think either of you might be pregnant. This medication can cause serious birth defects if taken during pregnancy and for 6 months after the last dose. A negative pregnancy test is required before starting this medication. A reliable form of contraception is recommended while taking this medication and for 6 months after the last dose. Talk to your care team about reliable forms of contraception. Do not father a child while taking this medication and for 3 months after the  last dose. Use a condom while having sex during this time period. Do not breastfeed while taking this medication and for 1 week after the last dose. This medication may cause infertility. Talk to your care team if you are concerned about your fertility. What side effects may I notice from receiving this medication? Side effects that you should report to your care team as soon as possible: Allergic reactions--skin rash, itching, hives, swelling of the face, lips, tongue, or throat Dry cough, shortness of breath or trouble breathing Infection--fever, chills, cough, sore throat, wounds that don't heal, pain or trouble when passing urine, general feeling of discomfort or being unwell Kidney injury--decrease in the amount of urine, swelling of the ankles, hands, or feet Low red blood cell  level--unusual weakness or fatigue, dizziness, headache, trouble breathing Redness, blistering, peeling, or loosening of the skin, including inside the mouth Unusual bruising or bleeding Side effects that usually do not require medical attention (report to your care team if they continue or are bothersome): Fatigue Loss of appetite Nausea Vomiting This list may not describe all possible side effects. Call your doctor for medical advice about side effects. You may report side effects to FDA at 1-800-FDA-1088. Where should I keep my medication? This medication is given in a hospital or clinic. It will not be stored at home. NOTE: This sheet is a summary. It may not cover all possible information. If you have questions about this medicine, talk to your doctor, pharmacist, or health care provider.  2024 Elsevier/Gold Standard (2021-07-12 00:00:00)   BELOW ARE SYMPTOMS THAT SHOULD BE REPORTED IMMEDIATELY: *FEVER GREATER THAN 100.4 F (38 C) OR HIGHER *CHILLS OR SWEATING *NAUSEA AND VOMITING THAT IS NOT CONTROLLED WITH YOUR NAUSEA MEDICATION *UNUSUAL SHORTNESS OF BREATH *UNUSUAL BRUISING OR BLEEDING *URINARY PROBLEMS (pain or burning when urinating, or frequent urination) *BOWEL PROBLEMS (unusual diarrhea, constipation, pain near the anus) TENDERNESS IN MOUTH AND THROAT WITH OR WITHOUT PRESENCE OF ULCERS (sore throat, sores in mouth, or a toothache) UNUSUAL RASH, SWELLING OR PAIN  UNUSUAL VAGINAL DISCHARGE OR ITCHING   Items with * indicate a potential emergency and should be followed up as soon as possible or go to the Emergency Department if any problems should occur.  Please show the CHEMOTHERAPY ALERT CARD or IMMUNOTHERAPY ALERT CARD at check-in to the Emergency Department and triage nurse.  Should you have questions after your visit or need to cancel or reschedule your appointment, please contact [facility] [phone]  and follow the prompts.  Office hours are  8:00 a.m. to 4:30 p.m. Monday - Friday. Please note that voicemails left after 4:00 p.m. may not be returned until the following business day.  We are closed weekends and major holidays. You have access to a nurse at all times for urgent questions. Please call the main number to the clinic [phone] and follow the prompts.  For any non-urgent questions, you may also contact your provider using MyChart. We now offer e-Visits for anyone 75 and older to request care online for non-urgent symptoms. For details visit [email or web address].   Also download the MyChart app! Go to the app store, search "MyChart", open the app, select [facility], and log in with your MyChart username and password.

## 2022-10-19 NOTE — Progress Notes (Signed)
Patients port flushed without difficulty.  Good blood return noted with no bruising or swelling noted at site.  VSS. Patient remains accessed for treatment.  

## 2022-10-19 NOTE — Patient Instructions (Addendum)
[facility] at [facility] Discharge Instructions   You were seen and examined today by Dr. Ellin Saba.  He reviewed the results of your CT scan which is stable.   He reviewed the results of your lab work which are normal/stable.   We will proceed with your treatment today.   You can cut back on your Lasix to 20 mg daily.   Return as scheduled.    Thank you for choosing [facility] at [facility] to provide your oncology and hematology care.  To afford each patient quality time with our provider, please arrive at least 15 minutes before your scheduled appointment time.   If you have a lab appointment with the Cancer Center please come in thru the Main Entrance and check in at the main information desk.  You need to re-schedule your appointment should you arrive 10 or more minutes late.  We strive to give you quality time with our providers, and arriving late affects you and other patients whose appointments are after yours.  Also, if you no show three or more times for appointments you may be dismissed from the clinic at the providers discretion.     Again, thank you for choosing [facility].  Our hope is that these requests will decrease the amount of time that you wait before being seen by our physicians.       _____________________________________________________________  Should you have questions after your visit to [facility], please contact our office at [phone] and follow the prompts.  Our office hours are 8:00 a.m. and 4:30 p.m. Monday - Friday.  Please note that voicemails left after 4:00 p.m. may not be returned until the following business day.  We are closed weekends and major holidays.  You do have access to a nurse 24-7, just call the main number to the clinic [phone] and do not press any options, hold on the line and a nurse will answer the phone.    For prescription refill requests, have your  pharmacy contact our office and allow 72 hours.    Due to Covid, you will need to wear a mask upon entering the hospital. If you do not have a mask, a mask will be given to you at the Main Entrance upon arrival. For doctor visits, patients may have 1 support person age 47 or older with them. For treatment visits, patients can not have anyone with them due to social distancing guidelines and our immunocompromised population.

## 2022-10-19 NOTE — Progress Notes (Signed)
Patient presents today for Keytruda/Vitamin B12 injection and  Alimta infusion. Patient is in satisfactory condition with no new complaints voiced.  Vital signs are stable.  Labs reviewed by Dr. Ellin Saba during the office visit and all labs are within treatment parameters.  We will proceed with treatment per MD orders.   Treatment given today per MD orders. Tolerated infusion without adverse affects. Vital signs stable. No complaints at this time. Discharged from clinic via wheelchair in stable condition. Alert and oriented x 3. F/U with [facility] as scheduled.

## 2022-10-19 NOTE — Progress Notes (Signed)
Patient has been examined by Dr. Katragadda. Vital signs and labs have been reviewed by MD - ANC, Creatinine, LFTs, hemoglobin, and platelets are within treatment parameters per M.D. - pt may proceed with treatment.  Primary RN and pharmacy notified.  

## 2022-10-25 ENCOUNTER — Encounter: Payer: Self-pay | Admitting: Cardiology

## 2022-10-25 ENCOUNTER — Ambulatory Visit: Payer: Medicare HMO | Attending: Cardiology | Admitting: Cardiology

## 2022-10-25 VITALS — BP 96/60 | HR 118 | Ht 62.0 in | Wt 232.0 lb

## 2022-10-25 DIAGNOSIS — I272 Pulmonary hypertension, unspecified: Secondary | ICD-10-CM

## 2022-10-25 DIAGNOSIS — I350 Nonrheumatic aortic (valve) stenosis: Secondary | ICD-10-CM

## 2022-10-25 MED ORDER — MIDODRINE HCL 5 MG PO TABS: 5.0000 mg | ORAL_TABLET | Freq: Two times a day (BID) | ORAL | 2 refills | Status: DC

## 2022-10-25 NOTE — Patient Instructions (Signed)
Medication Instructions:   START midodrine 5 mg twice a day with meals.  Labwork:  None today  Testing/Procedures:  None today  Follow-Up: 4 months  Any Other Special Instructions Will Be Listed Below (If Applicable).  If you need a refill on your cardiac medications before your next appointment, please call your pharmacy.

## 2022-10-25 NOTE — Progress Notes (Signed)
Cardiology Office Note  Date: 10/25/2022   ID: Luke Dephillips, DOB 15-Dec-1950, MRN [id number]  History of Present Illness: Denise [name] is a 72 y.o. female last seen in February.  She is here for a routine visit.  Reports chronic dyspnea exertion, NYHA class III, maintaining supplemental oxygen via nasal cannula.  She does have some mild orthostatic symptoms and intermittent leg edema.  Lasix and potassium supplement were cut back most recently by Dr. Ellin Saba.  She has had to intensify diuretics intermittently for leg swelling exacerbation.  Tends to generally run a low blood pressure at baseline with elevated heart rate.  She continues to follow with Dr. Ellin Saba for treatment of stage IV adenocarcinoma of the lung, I reviewed the recent note.  We went over her medications.  Discussed addition of midodrine.  I reviewed her echocardiogram from May, she has evidence of moderate to severe aortic stenosis, following conservatively at this time.  She would not be an optimal candidate for TAVR at this point in light of other comorbidities.  Physical Exam: VS:  BP 96/60   Pulse (!) 118   Ht 5\' 2"  (1.575 m)   Wt 232 lb (105.2 kg)   SpO2 94%   BMI 42.43 kg/m , BMI Body mass index is 42.43 kg/m.  Wt Readings from Last 3 Encounters:  10/25/22 232 lb (105.2 kg)  10/19/22 231 lb 9.6 oz (105.1 kg)  10/09/22 231 lb 6.4 oz (105 kg)    General: Patient appears comfortable at rest. HEENT: Conjunctiva and lids normal. Neck: Supple, no elevated JVP or carotid bruits. Lungs: Clear to auscultation, nonlabored breathing at rest. Cardiac: Regular rate and rhythm, no S3, 2-3/6 systolic murmur. Extremities: Mild lower leg edema.  ECG:  An ECG dated 09/25/2022 was personally reviewed today and demonstrated:  Sinus rhythm with PAC, right bundle branch block, left anterior fascicular block.  Labwork: 10/19/2022: ALT 19; AST 22; BUN 10; Creatinine, Ser 0.84; Hemoglobin 9.4; Magnesium 1.8; Platelets 360;  Potassium 3.9; Sodium 138; TSH 4.478     Component Value Date/Time   CHOL 166 12/15/2021 0754   TRIG 100 12/15/2021 0754   HDL 67 12/15/2021 0754   CHOLHDL 2.5 12/15/2021 0754   VLDL 20 12/15/2021 0754   LDLCALC 79 12/15/2021 0754   Other Studies Reviewed Today:  Echocardiogram 07/19/2022:  1. Left ventricular ejection fraction, by estimation, is 60 to 65%. The  left ventricle has normal function. The left ventricle has no regional  wall motion abnormalities. There is moderate left ventricular hypertrophy.  Left ventricular diastolic  parameters are consistent with Grade I diastolic dysfunction (impaired  relaxation).   2. Ventricular septum is flattened in systole suggesting RV pressure  overload. . Right ventricular systolic function is normal. The right  ventricular size is mildly enlarged. There is severely elevated pulmonary  artery systolic pressure.   3. Left atrial size was mildly dilated.   4. The mitral valve is abnormal. Trivial mitral valve regurgitation. No  evidence of mitral stenosis. Moderate mitral annular calcification.   5. The tricuspid valve is abnormal. Tricuspid valve regurgitation is  moderate.   6. The aortic valve is tricuspid. There is moderate calcification of the  aortic valve. There is moderate thickening of the aortic valve. Aortic  valve regurgitation is moderate. Severe aortic valve stenosis. Aortic  valve mean gradient measures 42.0 mmHg.   Aortic valve peak gradient measures 68.3 mmHg. Aortic valve area, by VTI  measures 0.92 cm.   7. The inferior vena  cava is normal in size with greater than 50%  respiratory variability, suggesting right atrial pressure of 3 mmHg.   Assessment and Plan:  1.  Calcific aortic stenosis, moderate to severe with mean AV gradient 42 mmHg, dimensionless index of 0.36, and also moderate aortic regurgitation by echocardiogram in May..  Continue to follow at this point, she is not an optimal candidate for TAVR in light  of other comorbid illnesses including stage IV adenocarcinoma of the lung.   2.  Severe pulmonary hypertension with RV dysfunction, likely WHO group 3 or 4 in the setting of chronic lung disease with stage IV adenocarcinoma and also pulmonary emboli.  She remains on Lasix with potassium supplement for control of peripheral edema.  We discussed self adjustments that could be made based on fluid retention.   3.  Normal coronary arteries at cardiac catheterization in 2021.  Disposition:  Follow up  4 months.  Signed, Jonelle Sidle, M.D., F.A.C.C. [facility] at [facility]

## 2022-10-26 ENCOUNTER — Ambulatory Visit (HOSPITAL_COMMUNITY): Payer: Medicare HMO

## 2022-10-26 ENCOUNTER — Encounter: Payer: Medicare HMO | Admitting: Vascular Surgery

## 2022-10-27 ENCOUNTER — Other Ambulatory Visit: Payer: Self-pay

## 2022-10-30 ENCOUNTER — Encounter: Payer: Self-pay | Admitting: Surgery

## 2022-10-30 ENCOUNTER — Inpatient Hospital Stay: Payer: Medicare HMO

## 2022-10-30 ENCOUNTER — Inpatient Hospital Stay: Payer: Medicare HMO | Admitting: Hematology

## 2022-10-30 ENCOUNTER — Ambulatory Visit (INDEPENDENT_AMBULATORY_CARE_PROVIDER_SITE_OTHER): Payer: Medicare HMO | Admitting: Surgery

## 2022-10-30 ENCOUNTER — Ambulatory Visit (HOSPITAL_COMMUNITY)
Admission: RE | Admit: 2022-10-30 | Discharge: 2022-10-30 | Disposition: A | Discharge status: Home / Self Care | Payer: Medicare HMO | Source: Ambulatory Visit | Attending: Vascular Surgery | Admitting: Vascular Surgery

## 2022-10-30 VITALS — BP 95/58 | HR 116 | Temp 97.5°F | Ht 62.0 in

## 2022-10-30 DIAGNOSIS — R600 Localized edema: Secondary | ICD-10-CM

## 2022-10-30 DIAGNOSIS — M79605 Pain in left leg: Secondary | ICD-10-CM | POA: Diagnosis not present

## 2022-10-30 DIAGNOSIS — I2782 Chronic pulmonary embolism: Secondary | ICD-10-CM | POA: Diagnosis not present

## 2022-10-30 DIAGNOSIS — M79604 Pain in right leg: Secondary | ICD-10-CM | POA: Diagnosis not present

## 2022-10-30 LAB — VAS US ABI WITH/WO TBI
Left ABI: 1.33
Right ABI: 1.38

## 2022-10-30 NOTE — Progress Notes (Signed)
[facility] [place]  Patient name: Denise [name] MRN: [id number] DOB: [birth year] Sex: female   REQUESTING PROVIDER:    Dr. Ellin Saba   REASON FOR CONSULT:    Loss of sensation in her toes and feet  HISTORY OF PRESENT ILLNESS:   Denise [name] is a 72 y.o. female, who is referred for numbness and tingling in her feet.  She states this began about a month ago the left is worse than the right.  She thinks that it is secondary to the swelling in her feet.  She says that when she elevates her legs, they feel better and the discomfort goes away.  She does wear compression socks but gets very short of breath just by putting them on, and they are hard for her to get on.  She does not have any open wounds.  She does not complain of cramping when she walks.  The patient suffers from stage IV adenocarcinoma of the lung.  She has completed chemoradiation in 2020 and is on maintenance therapy now she also has a left sphenoid wing meningioma.  She has a history of an unprovoked PE, for which she is taking Eliquis indefinitely.  She is on diuretics for leg swelling.  PAST MEDICAL HISTORY    Past Medical History:  Diagnosis Date   Anemia    Aortic stenosis    Arthritis    Coronary artery calcification seen on CT scan    Dyspnea    Essential hypertension    GERD (gastroesophageal reflux disease)    History of lung cancer    Stage III adenocarcinoma status post chemoradiation   Port-A-Cath in place 01/06/2019   Type 2 diabetes mellitus (HCC)      FAMILY HISTORY   Family History  Problem Relation Age of Onset   Breast cancer Mother    COPD Mother    Arthritis Mother    Diabetes Mother    Hypertension Mother    Hypertension Father    Diabetes Father    Breast cancer Sister    Thyroid cancer Brother    Heart attack Brother    Huntington's disease Maternal Grandmother     SOCIAL HISTORY:   Social History   Socioeconomic  History   Marital status: Single    Spouse name: Not on file   Number of children: Not on file   Years of education: 12th grade   Highest education level: Not on file  Occupational History   Occupation: Employed    Employer: [employer]  Tobacco Use   Smoking status: Former    Current packs/day: 0.00    Average packs/day: 1 pack/day for 12.0 years (12.0 ttl pk-yrs)    Types: Cigarettes    Start date: 03/20/1964    Quit date: 03/20/1976    Years since quitting: 46.6    Passive exposure: Past   Smokeless tobacco: Never  Vaping Use   Vaping status: Never Used  Substance and Sexual Activity   Alcohol use: No   Drug use: No   Sexual activity: Not Currently  Other Topics Concern   Not on file  Social History Narrative   Not on file   Social Determinants of Health   Financial Resource Strain: Low Risk  (12/06/2018)   Overall Financial Resource Strain (CARDIA)    Difficulty of Paying Living Expenses: Not very hard  Food Insecurity: No Food Insecurity (12/09/2018)   Received from [facility], [facility]   Hunger Vital Sign  Worried About Programme researcher, broadcasting/film/video in the Last Year: Never true    Ran Out of Food in the Last Year: Never true  Transportation Needs: No Transportation Needs (12/09/2018)   Received from [facility], [facility]   [facility] - Transportation    Lack of Transportation (Medical): No    Lack of Transportation (Non-Medical): No  Physical Activity: Inactive (12/06/2018)   Exercise Vital Sign    Days of Exercise per Week: 0 days    Minutes of Exercise per Session: 0 min  Stress: No Stress Concern Present (12/09/2018)   Received from [facility], [facility] of Occupational Health - Occupational Stress Questionnaire    Feeling of Stress : Not at all  Social Connections: Moderately Isolated (12/06/2018)   Social Connection and Isolation Panel [NHANES]    Frequency of Communication with Friends and Family: More  than three times a week    Frequency of Social Gatherings with Friends and Family: Once a week    Attends Religious Services: More than 4 times per year    Active Member of [employer] or Organizations: No    Attends Banker Meetings: Never    Marital Status: Never married  Intimate Partner Violence: Not At Risk (12/29/2020)   Received from [facility], [facility]   Humiliation, Afraid, Rape, and Kick questionnaire    Fear of Current or Ex-Partner: No    Emotionally Abused: No    Physically Abused: No    Sexually Abused: No    ALLERGIES:    No Known Allergies  CURRENT MEDICATIONS:    Current Outpatient Medications  Medication Sig Dispense Refill   Calcium Carbonate Antacid (TUMS PO) Take 4 tablets by mouth daily.     carvedilol (COREG) 3.125 MG tablet TAKE 1 TABLET BY MOUTH TWICE A DAY 180 tablet 2   Cyanocobalamin (VITAMIN B-12) 2500 MCG SUBL Place 2,500 mcg under the tongue every morning.      diphenhydrAMINE (BENADRYL) 25 MG tablet Take 25 mg by mouth every 6 (six) hours as needed for itching or allergies.     ELIQUIS 5 MG TABS tablet TAKE 1 TABLET BY MOUTH TWICE A DAY 60 tablet 6   ferrous sulfate 325 (65 FE) MG tablet Take 325 mg by mouth every evening.     folic acid (FOLVITE) 1 MG tablet TAKE 1 TABLET BY MOUTH EVERY DAY 90 tablet 1   furosemide (LASIX) 20 MG tablet Take 20 mg by mouth daily.     gabapentin (NEURONTIN) 300 MG capsule Take 300 mg by mouth at bedtime.     midodrine (PROAMATINE) 5 MG tablet Take 1 tablet (5 mg total) by mouth 2 (two) times daily with a meal. 180 tablet 2   Multiple Vitamin (MULITIVITAMIN WITH MINERALS) TABS Take 1 tablet by mouth daily.     potassium chloride SA (KLOR-CON M) 20 MEQ tablet Take 20 mEq by mouth daily.     No current facility-administered medications for this visit.   Facility-Administered Medications Ordered in Other Visits  Medication Dose Route Frequency Provider Last Rate Last Admin   magnesium sulfate 2  GM/50ML IVPB             REVIEW OF SYSTEMS:   [X]  denotes positive finding, [ ]  denotes negative finding Cardiac  Comments:  Chest pain or chest pressure:    Shortness of breath upon exertion: x   Short of breath when lying flat: x  Irregular heart rhythm:        Vascular    Pain in calf, thigh, or hip brought on by ambulation:    Pain in feet at night that wakes you up from your sleep:     Blood clot in your veins: x   Leg swelling:         Pulmonary    Oxygen at home: x   Productive cough:     Wheezing:         Neurologic    Sudden weakness in arms or legs:     Sudden numbness in arms or legs:     Sudden onset of difficulty speaking or slurred speech:    Temporary loss of vision in one eye:     Problems with dizziness:         Gastrointestinal    Blood in stool:      Vomited blood:         Genitourinary    Burning when urinating:     Blood in urine:        Psychiatric    Major depression:         Hematologic    Bleeding problems:    Problems with blood clotting too easily:        Skin    Rashes or ulcers:        Constitutional    Fever or chills:     PHYSICAL EXAM:   Vitals:   10/30/22 1426  BP: (!) 95/58  Pulse: (!) 116  Temp: (!) 97.5 F (36.4 C)  TempSrc: Temporal  SpO2: 94%  Height: 5\' 2"  (1.575 m)    GENERAL: The patient is a well-nourished female, in no acute distress. The vital signs are documented above. CARDIAC: There is a regular rate and rhythm.  VASCULAR: Significant bilateral pitting edema with brawny discoloration of the skin.  Pedal pulses are nonpalpable. PULMONARY: Nonlabored respirations MUSCULOSKELETAL: There are no major deformities or cyanosis. NEUROLOGIC: No focal weakness or paresthesias are detected. SKIN: There are no ulcers or rashes noted. PSYCHIATRIC: The patient has a normal affect.  STUDIES:   I have reviewed the following: ABI/TBIToday's ABI     Today's TBIPrevious ABIPrevious TBI   +-------+----------------+-----------+------------+------------+  Right 1.38 (calcified)1.21                                 +-------+----------------+-----------+------------+------------+  Left  1.33 (calcified)0.84                                 +-------+----------------+-----------+------------+------------+  Right toe pressure: 131 Left toe pressure: 91 All waveforms are biphasic  ASSESSMENT and PLAN   Lower extremity edema: Suspect that her symptoms are mainly attributed to her leg swelling.  When she elevates her legs, her symptoms get better.  She has difficulty getting her compression socks on.  I have recommended and have given her knees assistance for finding rocks with zippers.  I have also encouraged her to keep her legs elevated when possible.  She should continue her diuretic therapy as this does help as well.  Based off her vascular studies and description of her symptoms, I do not feel that this is an arterial problem.  I have not scheduled her for return follow-up but would be happy to see her if her symptoms change.   Durene Cal, IV, MD, FACS  [facility] [phone] Pager [phone]

## 2022-11-09 ENCOUNTER — Inpatient Hospital Stay: Payer: Medicare HMO | Admitting: Hematology

## 2022-11-09 ENCOUNTER — Inpatient Hospital Stay: Payer: Medicare HMO

## 2022-11-09 VITALS — BP 100/63 | HR 85 | Temp 97.7°F | Resp 20 | Wt 231.6 lb

## 2022-11-09 DIAGNOSIS — D649 Anemia, unspecified: Secondary | ICD-10-CM | POA: Diagnosis not present

## 2022-11-09 DIAGNOSIS — Z5112 Encounter for antineoplastic immunotherapy: Secondary | ICD-10-CM | POA: Diagnosis not present

## 2022-11-09 DIAGNOSIS — I2699 Other pulmonary embolism without acute cor pulmonale: Secondary | ICD-10-CM | POA: Diagnosis not present

## 2022-11-09 DIAGNOSIS — C3492 Malignant neoplasm of unspecified part of left bronchus or lung: Secondary | ICD-10-CM

## 2022-11-09 DIAGNOSIS — Z5111 Encounter for antineoplastic chemotherapy: Secondary | ICD-10-CM | POA: Diagnosis not present

## 2022-11-09 DIAGNOSIS — D123 Benign neoplasm of transverse colon: Secondary | ICD-10-CM | POA: Diagnosis not present

## 2022-11-09 DIAGNOSIS — C3412 Malignant neoplasm of upper lobe, left bronchus or lung: Secondary | ICD-10-CM | POA: Diagnosis not present

## 2022-11-09 DIAGNOSIS — D329 Benign neoplasm of meninges, unspecified: Secondary | ICD-10-CM | POA: Diagnosis not present

## 2022-11-09 DIAGNOSIS — J90 Pleural effusion, not elsewhere classified: Secondary | ICD-10-CM | POA: Diagnosis not present

## 2022-11-09 DIAGNOSIS — C787 Secondary malignant neoplasm of liver and intrahepatic bile duct: Secondary | ICD-10-CM | POA: Diagnosis not present

## 2022-11-09 LAB — COMPREHENSIVE METABOLIC PANEL
ALT: 14 U/L (ref 0–44)
AST: 15 U/L (ref 15–41)
Albumin: 2.4 g/dL — ABNORMAL LOW (ref 3.5–5.0)
Alkaline Phosphatase: 79 U/L (ref 38–126)
Anion gap: 7 (ref 5–15)
BUN: 9 mg/dL (ref 8–23)
CO2: 25 mmol/L (ref 22–32)
Calcium: 8.1 mg/dL — ABNORMAL LOW (ref 8.9–10.3)
Chloride: 106 mmol/L (ref 98–111)
Creatinine, Ser: 0.77 mg/dL (ref 0.44–1.00)
GFR, Estimated: >60 mL/min (ref >60)
Glucose, Bld: 110 mg/dL — ABNORMAL HIGH (ref 70–99)
Potassium: 3.7 mmol/L (ref 3.5–5.1)
Sodium: 138 mmol/L (ref 135–145)
Total Bilirubin: 0.3 mg/dL (ref 0.3–1.2)
Total Protein: 5.9 g/dL — ABNORMAL LOW (ref 6.5–8.1)

## 2022-11-09 LAB — CBC WITH DIFFERENTIAL/PLATELET
Abs Immature Granulocytes: 0.02 10*3/uL (ref 0.00–0.07)
Basophils Absolute: 0 10*3/uL (ref 0.0–0.1)
Basophils Relative: 0 %
Eosinophils Absolute: 0.1 10*3/uL (ref 0.0–0.5)
Eosinophils Relative: 2 %
HCT: 28.9 % — ABNORMAL LOW (ref 36.0–46.0)
Hemoglobin: 8.7 g/dL — ABNORMAL LOW (ref 12.0–15.0)
Immature Granulocytes: 0 %
Lymphocytes Relative: 12 %
Lymphs Abs: 0.9 10*3/uL (ref 0.7–4.0)
MCH: 30 pg (ref 26.0–34.0)
MCHC: 30.1 g/dL (ref 30.0–36.0)
MCV: 99.7 fL (ref 80.0–100.0)
Monocytes Absolute: 0.9 10*3/uL (ref 0.1–1.0)
Monocytes Relative: 11 %
Neutro Abs: 5.6 10*3/uL (ref 1.7–7.7)
Neutrophils Relative %: 75 %
Platelets: 437 10*3/uL — ABNORMAL HIGH (ref 150–400)
RBC: 2.9 MIL/uL — ABNORMAL LOW (ref 3.87–5.11)
RDW: 19.3 % — ABNORMAL HIGH (ref 11.5–15.5)
WBC: 7.6 10*3/uL (ref 4.0–10.5)
nRBC: 0 % (ref 0.0–0.2)

## 2022-11-09 LAB — MAGNESIUM: Magnesium: 2 mg/dL (ref 1.7–2.4)

## 2022-11-09 MED ORDER — ONDANSETRON HCL 4 MG/2ML IJ SOLN
8.0000 mg | Freq: Once | INTRAMUSCULAR | Status: AC
  Administered 2022-11-09: 8 mg via INTRAVENOUS
  Filled 2022-11-09: qty 4

## 2022-11-09 MED ORDER — SODIUM CHLORIDE 0.9 % IV SOLN: Freq: Once | INTRAVENOUS | Status: AC

## 2022-11-09 MED ORDER — SODIUM CHLORIDE 0.9 % IV SOLN
200.0000 mg | Freq: Once | INTRAVENOUS | Status: AC
  Administered 2022-11-09: 200 mg via INTRAVENOUS
  Filled 2022-11-09: qty 8

## 2022-11-09 MED ORDER — SODIUM CHLORIDE 0.9% FLUSH
10.0000 mL | INTRAVENOUS | Status: DC | PRN
  Administered 2022-11-09: 10 mL

## 2022-11-09 MED ORDER — SODIUM CHLORIDE 0.9% FLUSH
10.0000 mL | INTRAVENOUS | Status: DC | PRN
  Administered 2022-11-09: 10 mL via INTRAVENOUS

## 2022-11-09 MED ORDER — SODIUM CHLORIDE 0.9 % IV SOLN
400.0000 mg/m2 | Freq: Once | INTRAVENOUS | Status: AC
  Administered 2022-11-09: 900 mg via INTRAVENOUS
  Filled 2022-11-09: qty 20

## 2022-11-09 MED ORDER — HEPARIN SOD (PORK) LOCK FLUSH 100 UNIT/ML IV SOLN
500.0000 [IU] | Freq: Once | INTRAVENOUS | Status: AC | PRN
  Administered 2022-11-09: 500 [IU]

## 2022-11-09 NOTE — Patient Instructions (Signed)
[facility]  Discharge Instructions: Thank you for choosing [facility] to provide your oncology and hematology care.  If you have a lab appointment with the Cancer Center - please note that after April 8th, 2024, all labs will be drawn in the cancer center.  You do not have to check in or register with the main entrance as you have in the past but will complete your check-in in the cancer center.  Wear comfortable clothing and clothing appropriate for easy access to any Portacath or PICC line.   We strive to give you quality time with your provider. You may need to reschedule your appointment if you arrive late (15 or more minutes).  Arriving late affects you and other patients whose appointments are after yours.  Also, if you miss three or more appointments without notifying the office, you may be dismissed from the clinic at the provider's discretion.      For prescription refill requests, have your pharmacy contact our office and allow 72 hours for refills to be completed.    Today you received the following chemotherapy and/or immunotherapy agents Keytruda/Alimta      To help prevent nausea and vomiting after your treatment, we encourage you to take your nausea medication as directed.  BELOW ARE SYMPTOMS THAT SHOULD BE REPORTED IMMEDIATELY: *FEVER GREATER THAN 100.4 F (38 C) OR HIGHER *CHILLS OR SWEATING *NAUSEA AND VOMITING THAT IS NOT CONTROLLED WITH YOUR NAUSEA MEDICATION *UNUSUAL SHORTNESS OF BREATH *UNUSUAL BRUISING OR BLEEDING *URINARY PROBLEMS (pain or burning when urinating, or frequent urination) *BOWEL PROBLEMS (unusual diarrhea, constipation, pain near the anus) TENDERNESS IN MOUTH AND THROAT WITH OR WITHOUT PRESENCE OF ULCERS (sore throat, sores in mouth, or a toothache) UNUSUAL RASH, SWELLING OR PAIN  UNUSUAL VAGINAL DISCHARGE OR ITCHING   Items with * indicate a potential emergency and should be followed up as soon as possible or go to  the Emergency Department if any problems should occur.  Please show the CHEMOTHERAPY ALERT CARD or IMMUNOTHERAPY ALERT CARD at check-in to the Emergency Department and triage nurse.  Should you have questions after your visit or need to cancel or reschedule your appointment, please contact [facility] [phone]  and follow the prompts.  Office hours are 8:00 a.m. to 4:30 p.m. Monday - Friday. Please note that voicemails left after 4:00 p.m. may not be returned until the following business day.  We are closed weekends and major holidays. You have access to a nurse at all times for urgent questions. Please call the main number to the clinic [phone] and follow the prompts.  For any non-urgent questions, you may also contact your provider using MyChart. We now offer e-Visits for anyone 23 and older to request care online for non-urgent symptoms. For details visit [email or web address].   Also download the MyChart app! Go to the app store, search "MyChart", open the app, select [place], and log in with your MyChart username and password.

## 2022-11-09 NOTE — Progress Notes (Signed)
Patient presents today for Keytruda/Alimta infusions per providers order.  Vital signs and labs within parameters for treatment.  Patient has no new complaints at this time.  Treatment given today per MD orders.  Stable during infusion without adverse affects.  Vital signs stable.  No complaints at this time.  Discharge from clinic ambulatory in stable condition.  Alert and oriented X 3.  Follow up with [facility] as scheduled.

## 2022-11-21 ENCOUNTER — Ambulatory Visit: Payer: Medicare HMO | Admitting: Hematology

## 2022-11-21 ENCOUNTER — Ambulatory Visit: Payer: Medicare HMO

## 2022-11-21 ENCOUNTER — Other Ambulatory Visit: Payer: Medicare HMO

## 2022-11-22 ENCOUNTER — Other Ambulatory Visit: Payer: Self-pay | Admitting: Hematology

## 2022-11-24 DIAGNOSIS — Z6841 Body Mass Index (BMI) 40.0 and over, adult: Secondary | ICD-10-CM | POA: Diagnosis not present

## 2022-11-24 DIAGNOSIS — M170 Bilateral primary osteoarthritis of knee: Secondary | ICD-10-CM | POA: Diagnosis not present

## 2022-11-30 ENCOUNTER — Inpatient Hospital Stay: Payer: Medicare HMO | Attending: Neurosurgery

## 2022-11-30 ENCOUNTER — Inpatient Hospital Stay (HOSPITAL_BASED_OUTPATIENT_CLINIC_OR_DEPARTMENT_OTHER): Payer: Medicare HMO | Admitting: Hematology

## 2022-11-30 ENCOUNTER — Inpatient Hospital Stay: Payer: Medicare HMO

## 2022-11-30 VITALS — BP 122/86 | HR 96 | Temp 96.4°F | Resp 20 | Wt 219.0 lb

## 2022-11-30 DIAGNOSIS — C3492 Malignant neoplasm of unspecified part of left bronchus or lung: Secondary | ICD-10-CM

## 2022-11-30 DIAGNOSIS — D649 Anemia, unspecified: Secondary | ICD-10-CM | POA: Diagnosis not present

## 2022-11-30 DIAGNOSIS — Z5112 Encounter for antineoplastic immunotherapy: Secondary | ICD-10-CM | POA: Insufficient documentation

## 2022-11-30 DIAGNOSIS — Z7901 Long term (current) use of anticoagulants: Secondary | ICD-10-CM | POA: Insufficient documentation

## 2022-11-30 DIAGNOSIS — R600 Localized edema: Secondary | ICD-10-CM | POA: Diagnosis not present

## 2022-11-30 DIAGNOSIS — C787 Secondary malignant neoplasm of liver and intrahepatic bile duct: Secondary | ICD-10-CM | POA: Diagnosis not present

## 2022-11-30 DIAGNOSIS — Z86711 Personal history of pulmonary embolism: Secondary | ICD-10-CM | POA: Insufficient documentation

## 2022-11-30 DIAGNOSIS — C3412 Malignant neoplasm of upper lobe, left bronchus or lung: Secondary | ICD-10-CM | POA: Diagnosis not present

## 2022-11-30 DIAGNOSIS — Z79899 Other long term (current) drug therapy: Secondary | ICD-10-CM | POA: Diagnosis not present

## 2022-11-30 DIAGNOSIS — D508 Other iron deficiency anemias: Secondary | ICD-10-CM

## 2022-11-30 DIAGNOSIS — C7801 Secondary malignant neoplasm of right lung: Secondary | ICD-10-CM | POA: Diagnosis not present

## 2022-11-30 LAB — CBC WITH DIFFERENTIAL/PLATELET
Abs Immature Granulocytes: 0.02 10*3/uL (ref 0.00–0.07)
Basophils Absolute: 0 10*3/uL (ref 0.0–0.1)
Basophils Relative: 1 %
Eosinophils Absolute: 0 10*3/uL (ref 0.0–0.5)
Eosinophils Relative: 0 %
HCT: 30.5 % — ABNORMAL LOW (ref 36.0–46.0)
Hemoglobin: 8.9 g/dL — ABNORMAL LOW (ref 12.0–15.0)
Immature Granulocytes: 0 %
Lymphocytes Relative: 9 %
Lymphs Abs: 0.8 10*3/uL (ref 0.7–4.0)
MCH: 29.4 pg (ref 26.0–34.0)
MCHC: 29.2 g/dL — ABNORMAL LOW (ref 30.0–36.0)
MCV: 100.7 fL — ABNORMAL HIGH (ref 80.0–100.0)
Monocytes Absolute: 0.8 10*3/uL (ref 0.1–1.0)
Monocytes Relative: 9 %
Neutro Abs: 6.9 10*3/uL (ref 1.7–7.7)
Neutrophils Relative %: 81 %
Platelets: 503 10*3/uL — ABNORMAL HIGH (ref 150–400)
RBC: 3.03 MIL/uL — ABNORMAL LOW (ref 3.87–5.11)
RDW: 19 % — ABNORMAL HIGH (ref 11.5–15.5)
WBC: 8.5 10*3/uL (ref 4.0–10.5)
nRBC: 0.4 % — ABNORMAL HIGH (ref 0.0–0.2)

## 2022-11-30 LAB — COMPREHENSIVE METABOLIC PANEL
ALT: 19 U/L (ref 0–44)
AST: 20 U/L (ref 15–41)
Albumin: 2.7 g/dL — ABNORMAL LOW (ref 3.5–5.0)
Alkaline Phosphatase: 82 U/L (ref 38–126)
Anion gap: 12 (ref 5–15)
BUN: 14 mg/dL (ref 8–23)
CO2: 26 mmol/L (ref 22–32)
Calcium: 8.5 mg/dL — ABNORMAL LOW (ref 8.9–10.3)
Chloride: 101 mmol/L (ref 98–111)
Creatinine, Ser: 0.95 mg/dL (ref 0.44–1.00)
GFR, Estimated: >60 mL/min (ref >60)
Glucose, Bld: 118 mg/dL — ABNORMAL HIGH (ref 70–99)
Potassium: 3.7 mmol/L (ref 3.5–5.1)
Sodium: 139 mmol/L (ref 135–145)
Total Bilirubin: 0.3 mg/dL (ref 0.3–1.2)
Total Protein: 6.7 g/dL (ref 6.5–8.1)

## 2022-11-30 LAB — MAGNESIUM: Magnesium: 1.8 mg/dL (ref 1.7–2.4)

## 2022-11-30 MED ORDER — SODIUM CHLORIDE 0.9% FLUSH
10.0000 mL | Freq: Once | INTRAVENOUS | Status: AC
  Administered 2022-11-30: 10 mL via INTRAVENOUS

## 2022-11-30 MED ORDER — ONDANSETRON HCL 4 MG/2ML IJ SOLN
8.0000 mg | Freq: Once | INTRAMUSCULAR | Status: AC
  Administered 2022-11-30: 8 mg via INTRAVENOUS
  Filled 2022-11-30: qty 4

## 2022-11-30 MED ORDER — HEPARIN SOD (PORK) LOCK FLUSH 100 UNIT/ML IV SOLN
500.0000 [IU] | Freq: Once | INTRAVENOUS | Status: AC | PRN
  Administered 2022-11-30: 500 [IU]

## 2022-11-30 MED ORDER — SODIUM CHLORIDE 0.9% FLUSH
10.0000 mL | INTRAVENOUS | Status: DC | PRN
  Administered 2022-11-30: 10 mL

## 2022-11-30 MED ORDER — SODIUM CHLORIDE 0.9 % IV SOLN: Freq: Once | INTRAVENOUS | Status: AC

## 2022-11-30 MED ORDER — SODIUM CHLORIDE 0.9 % IV SOLN
400.0000 mg/m2 | Freq: Once | INTRAVENOUS | Status: AC
  Administered 2022-11-30: 900 mg via INTRAVENOUS
  Filled 2022-11-30: qty 36

## 2022-11-30 MED ORDER — SODIUM CHLORIDE 0.9 % IV SOLN
200.0000 mg | Freq: Once | INTRAVENOUS | Status: AC
  Administered 2022-11-30: 200 mg via INTRAVENOUS
  Filled 2022-11-30: qty 8

## 2022-11-30 NOTE — Progress Notes (Signed)
[facility] [address] [place] [address]    Clinic Day:  11/30/22   Referring physician: Mirna Mires, MD  Patient Care Team: Mirna Mires, MD as PCP - General (Family Medicine) Jonelle Sidle, MD as PCP - Cardiology (Cardiology) Doreatha Massed, MD as Medical Oncologist (Medical Oncology)   ASSESSMENT & PLAN:   Assessment: 1.  Adenocarcinoma of left lung (HCC) -Chemoradiation therapy with carboplatin and paclitaxel from 01/07/2019 through 02/11/2019. -Consolidation immunotherapy with durvalumab from 03/19/2019 through 04/15/2018, held due to pneumonitis. -CT chest on 07/02/2019 shows left upper lobe lung mass measuring 2.6 x 2.0 cm.  It shows improvement in size. -MRI of the brain on 07/18/2019 showed left sphenoid wing meningioma measuring 2.6 x 2.0 x 2.6 cm unchanged.  No new enhancing intracranial lesion.  Increased left temporal white matter edema. -Durvalumab restarted on 07/24/2019. -She was evaluated by cardiology with a stress test.  EF was 46%.  She underwent cardiac catheterization which did not show any abnormalities. - 1 year of durvalumab completed on 07/23/2020. - Liver mass biopsy (08/08/2021): Adenocarcinoma, CK7 positive, negative for TTF-1, Napsin a, GATA3, ER, CK20, CDX2 - PET scan (08/25/2021): Right upper lobe lung nodule 9 mm, SUV 5.0.  Groundglass and solid nodule periphery of the right upper lobe 8 mm, SUV 2.45.  Right lobe liver mass 4.5 x 3.6 cm, SUV 7.78. - NGS testing: PD-L1 negative, TMB-low, MSI-stable, K-ras G12 D.  No other targetable mutations. - Carboplatin, pemetrexed and pembrolizumab 4 cycles from 09/22/2021 through 11/25/2021, followed by maintenance pemetrexed and pembrolizumab -  CT CAP (11/10/2021): Reduced size of liver mass and interval resolution of previously seen groundglass pulmonary nodules.  There is a new inflammatory right upper lobe nodule.    Plan: 1.  Metastatic adenocarcinoma of the lung to the liver and  right lung: - She is tolerating pembrolizumab and pemetrexed reasonably well after 20% dose reduction of pemetrexed. - 2 weeks ago she started having difficulty breathing and required increased oxygen at 3 L/min nasal cannula on exertion.  She uses oxygen on exertion.  Starting yesterday morning, her breathing has gotten better. - Reviewed labs today: Normal LFTs.  Almond 2.7.  CBC grossly normal with hemoglobin 8.9.  Last TSH is 4.4. - She will proceed with pembrolizumab and pemetrexed today in 3 weeks.  RTC 6 weeks for follow-up.  Will plan to repeat CT chest prior to next visit.    2.  Meningioma: - MRI brain on 10/02/2022: Stable left sphenoid wing meningioma.  3.  Unprovoked pulmonary embolism: - Continue Eliquis 5 mg daily indefinitely.  No bleeding issues.   4.  Leg swellings: - Continue Lasix 40 mg daily as needed.   5.  Normocytic anemia: - Colonoscopy on 09/25/2022 showed nonbleeding internal hemorrhoids, diverticulosis, 2 mm polyp in transverse colon.  She had positive stool cards. - Hemoglobin today is 8.9.  Will closely monitor.    Orders Placed This Encounter  Procedures   CT Chest W Contrast    Standing Status:   Future    Standing Expiration Date:   11/30/2023    Order Specific Question:   If indicated for the ordered procedure, I authorize the administration of contrast media per Radiology protocol    Answer:   Yes    Order Specific Question:   Does the patient have a contrast media/X-ray dye allergy?    Answer:   No    Order Specific Question:   Preferred imaging location?  Answer:   [facility]   Iron and TIBC ([facility] ONLY)    Standing Status:   Future    Standing Expiration Date:   11/30/2023   Ferritin    Standing Status:   Future    Standing Expiration Date:   11/30/2023      Mikeal Hawthorne R Teague,acting as a scribe for Doreatha Massed, MD.,have documented all relevant documentation on the behalf of Doreatha Massed, MD,as  directed by  Doreatha Massed, MD while in the presence of Doreatha Massed, MD.  I, Doreatha Massed MD, have reviewed the above documentation for accuracy and completeness, and I agree with the above.     Doreatha Massed, MD   9/12/20245:21 PM  CHIEF COMPLAINT:   Diagnosis: stage IV adenocarcinoma of the left lung, negative for targetable mutations    Cancer Staging  Adenocarcinoma of left lung [facility]) Staging form: Lung, AJCC 8th Edition - Clinical stage from 01/02/2019: Stage IIIB (cT3, cN2, cM0) - Signed by Doreatha Massed, MD on 01/02/2019 - Pathologic stage from 09/07/2021: Stage IVA (pTX, pNX, pM1b) - Unsigned    Prior Therapy: 1. Chemoradiation with carboplatin and paclitaxel from 01/07/2019 to 02/11/2019. 2. Consolidation with durvalumab from 03/19/2019 to 04/16/2019, held due to pneumonitis.  Current Therapy:  Pemetrexed and pembrolizumab maintenance    HISTORY OF PRESENT ILLNESS:   Oncology History  Adenocarcinoma of left lung (HCC)  12/09/2018 Initial Diagnosis   Adenocarcinoma of left lung (HCC)   01/02/2019 Cancer Staging   Staging form: Lung, AJCC 8th Edition - Clinical stage from 01/02/2019: Stage IIIB (cT3, cN2, cM0) - Signed by Doreatha Massed, MD on 01/02/2019   01/07/2019 - 02/11/2019 Chemotherapy   The patient had palonosetron (ALOXI) injection 0.25 mg, 0.25 mg, Intravenous,  Once, 6 of 6 cycles Administration: 0.25 mg (01/07/2019), 0.25 mg (01/14/2019), 0.25 mg (01/21/2019), 0.25 mg (01/28/2019), 0.25 mg (02/04/2019), 0.25 mg (02/11/2019) CARBOplatin (PARAPLATIN) 270 mg in sodium chloride 0.9 % 250 mL chemo infusion, 270 mg (100 % of original dose 266.4 mg), Intravenous,  Once, 6 of 6 cycles Dose modification:   (original dose 266.4 mg, Cycle 1),   (original dose 266.4 mg, Cycle 2),   (original dose 266.4 mg, Cycle 3),   (original dose 266.4 mg, Cycle 4) Administration: 270 mg (01/07/2019), 270 mg (01/14/2019), 270 mg (01/21/2019),  270 mg (01/28/2019), 270 mg (02/04/2019), 270 mg (02/11/2019) PACLitaxel (TAXOL) 108 mg in sodium chloride 0.9 % 250 mL chemo infusion (</= 80mg /m2), 45 mg/m2 = 108 mg, Intravenous,  Once, 6 of 6 cycles Administration: 108 mg (01/07/2019), 108 mg (01/14/2019), 108 mg (01/21/2019), 108 mg (01/28/2019), 108 mg (02/04/2019), 108 mg (02/11/2019) fosaprepitant (EMEND) 150 mg, dexamethasone (DECADRON) 12 mg in sodium chloride 0.9 % 145 mL IVPB, , Intravenous,  Once, 5 of 5 cycles Administration:  (01/14/2019),  (01/21/2019),  (01/28/2019),  (02/04/2019),  (02/11/2019)  for chemotherapy treatment.    03/19/2019 - 07/23/2020 Chemotherapy   Patient is on Treatment Plan : LUNG DURVALUMAB Q14D     09/22/2021 - 11/04/2021 Chemotherapy   Patient is on Treatment Plan : LUNG Carboplatin (5) + Pemetrexed (500) + Pembrolizumab (200) D1 q21d Induction x 4 cycles / Maintenance Pemetrexed (500) + Pembrolizumab (200) D1 q21d     09/22/2021 -  Chemotherapy   Patient is on Treatment Plan : LUNG Carboplatin (5) + Pemetrexed (500) + Pembrolizumab (200) D1 q21d Induction x 4 cycles / Maintenance Pemetrexed (500) + Pembrolizumab (200) D1 q21d        INTERVAL HISTORY:  Denise [name] is a 72 y.o. female presenting to clinic today for follow up of stage IV adenocarcinoma of the left lung. She was last seen by me on 10/19/22.  Today, she states that she is doing well overall. Her appetite level is at 100%. Her energy level is at 40%.  She notes a normal appetite. She is tolerating treatment well. She reports worsened SOB the last 2 weeks and has increased her O2 to 3L at home when exerting herself. She notes yesterday her breathing started to improve. She denies any allergies. She is still taking Eliquis, Claritin, and Lasix. She denies any dizziness, hematochezia, or hematuria.   PAST MEDICAL HISTORY:   Past Medical History: Past Medical History:  Diagnosis Date   Anemia    Aortic stenosis    Arthritis    Coronary artery  calcification seen on CT scan    Dyspnea    Essential hypertension    GERD (gastroesophageal reflux disease)    History of lung cancer    Stage III adenocarcinoma status post chemoradiation   Port-A-Cath in place 01/06/2019   Type 2 diabetes mellitus [facility])     Surgical History: Past Surgical History:  Procedure Laterality Date   BIOPSY  09/25/2022   Procedure: BIOPSY;  Surgeon: Lanelle Bal, DO;  [facility]: [facility];  Service: Endoscopy;;   CHOLECYSTECTOMY  1997   COLONOSCOPY     COLONOSCOPY WITH PROPOFOL N/A 09/25/2022   Procedure: COLONOSCOPY WITH PROPOFOL;  Surgeon: Lanelle Bal, DO;  Location: [facility];  Service: Endoscopy;  Laterality: N/A;  11:00;ASA 3   ESOPHAGEAL BRUSHING  09/25/2022   Procedure: ESOPHAGEAL BRUSHING;  Surgeon: Lanelle Bal, DO;  Location: [facility];  Service: Endoscopy;;   ESOPHAGOGASTRODUODENOSCOPY (EGD) WITH PROPOFOL N/A 09/25/2022   Procedure: ESOPHAGOGASTRODUODENOSCOPY (EGD) WITH PROPOFOL;  Surgeon: Lanelle Bal, DO;  Location: [facility];  Service: Endoscopy;  Laterality: N/A;  11:00AM;ASA 3   GASTRIC BYPASS     INCISIONAL HERNIA REPAIR  04/11/11   IR IMAGING GUIDED PORT INSERTION  12/27/2018   Right   IR US GUIDE BX ASP/DRAIN  08/08/2021   LAPAROSCOPIC SALPINGOOPHERECTOMY     LAPAROTOMY  04/11/2011   Procedure: EXPLORATORY LAPAROTOMY;  Surgeon: Clovis Pu. Cornett, MD;  Location: [facility] ORS;  Service: General;  Laterality: N/A;  closure port hole   POLYPECTOMY  09/25/2022   Procedure: POLYPECTOMY;  Surgeon: Lanelle Bal, DO;  Location: [facility];  Service: Endoscopy;;   RIGHT/LEFT HEART CATH AND CORONARY ANGIOGRAPHY N/A 12/29/2019   Procedure: RIGHT/LEFT HEART CATH AND CORONARY ANGIOGRAPHY;  Surgeon: Lennette Bihari, MD;  Location: [facility] INVASIVE CV LAB;  Service: Cardiovascular;  Laterality: N/A;   TOTAL HIP ARTHROPLASTY  03/07/2012   Procedure: TOTAL HIP ARTHROPLASTY ANTERIOR APPROACH;  Surgeon: Shelda Pal, MD;  Location:  [facility] ORS;  Service: Orthopedics;  Laterality: Right;   TOTAL SHOULDER ARTHROPLASTY Left 01/21/2015   TOTAL SHOULDER ARTHROPLASTY Left 01/21/2015   Procedure: LEFT TOTAL SHOULDER ARTHROPLASTY;  Surgeon: Francena Hanly, MD;  Location: [facility] OR;  Service: Orthopedics;  Laterality: Left;   VAGINAL HYSTERECTOMY      Social History: Social History   Socioeconomic History   Marital status: Single    Spouse name: Not on file   Number of children: Not on file   Years of education: 12th grade   Highest education level: Not on file  Occupational History   Occupation: Employed    Employer: [employer]  Tobacco Use  Smoking status: Former    Current packs/day: 0.00    Average packs/day: 1 pack/day for 12.0 years (12.0 ttl pk-yrs)    Types: Cigarettes    Start date: 03/20/1964    Quit date: 03/20/1976    Years since quitting: 46.7    Passive exposure: Past   Smokeless tobacco: Never  Vaping Use   Vaping status: Never Used  Substance and Sexual Activity   Alcohol use: No   Drug use: No   Sexual activity: Not Currently  Other Topics Concern   Not on file  Social History Narrative   Not on file   Social Determinants of Health   Financial Resource Strain: Low Risk  (12/06/2018)   Overall Financial Resource Strain (CARDIA)    Difficulty of Paying Living Expenses: Not very hard  Food Insecurity: No Food Insecurity (12/09/2018)   Received from [facility], [facility]   Hunger Vital Sign    Worried About Running Out of Food in the Last Year: Never true    Ran Out of Food in the Last Year: Never true  Transportation Needs: No Transportation Needs (12/09/2018)   Received from [facility], [facility]   [facility] - Transportation    Lack of Transportation (Medical): No    Lack of Transportation (Non-Medical): No  Physical Activity: Inactive (12/06/2018)   Exercise Vital Sign    Days of Exercise per Week: 0 days    Minutes of Exercise per Session: 0 min  Stress: No Stress  Concern Present (12/09/2018)   Received from [facility], [facility] of Occupational [facility] - Occupational Stress Questionnaire    Feeling of Stress : Not at all  Social Connections: Moderately Isolated (12/06/2018)   Social Connection and Isolation Panel [NHANES]    Frequency of Communication with Friends and Family: More than three times a week    Frequency of Social Gatherings with Friends and Family: Once a week    Attends Religious Services: More than 4 times per year    Active Member of [employer] or Organizations: No    Attends Banker Meetings: Never    Marital Status: Never married  Intimate Partner Violence: Not At Risk (12/29/2020)   Received from [facility], [facility]   Humiliation, Afraid, Rape, and Kick questionnaire    Fear of Current or Ex-Partner: No    Emotionally Abused: No    Physically Abused: No    Sexually Abused: No    Family History: Family History  Problem Relation Age of Onset   Breast cancer Mother    COPD Mother    Arthritis Mother    Diabetes Mother    Hypertension Mother    Hypertension Father    Diabetes Father    Breast cancer Sister    Thyroid cancer Brother    Heart attack Brother    Huntington's disease Maternal Grandmother     Current Medications:  Current Outpatient Medications:    Calcium Carbonate Antacid (TUMS PO), Take 4 tablets by mouth daily., Disp: , Rfl:    carvedilol (COREG) 3.125 MG tablet, TAKE 1 TABLET BY MOUTH TWICE A DAY, Disp: 180 tablet, Rfl: 2   Cyanocobalamin (VITAMIN B-12) 2500 MCG SUBL, Place 2,500 mcg under the tongue every morning. , Disp: , Rfl:    diphenhydrAMINE (BENADRYL) 25 MG tablet, Take 25 mg by mouth every 6 (six) hours as needed for itching or allergies., Disp: , Rfl:  ELIQUIS 5 MG TABS tablet, TAKE 1 TABLET BY MOUTH TWICE A DAY, Disp: 60 tablet, Rfl: 6   ferrous sulfate 325 (65 FE) MG tablet, Take 325 mg by mouth every evening., Disp: , Rfl:    folic  acid (FOLVITE) 1 MG tablet, TAKE 1 TABLET BY MOUTH EVERY DAY, Disp: 90 tablet, Rfl: 1   furosemide (LASIX) 20 MG tablet, Take 20 mg by mouth daily., Disp: , Rfl:    gabapentin (NEURONTIN) 600 MG tablet, Take 1,200 mg by mouth at bedtime., Disp: , Rfl:    midodrine (PROAMATINE) 5 MG tablet, Take 1 tablet (5 mg total) by mouth 2 (two) times daily with a meal., Disp: 180 tablet, Rfl: 2   Multiple Vitamin (MULITIVITAMIN WITH MINERALS) TABS, Take 1 tablet by mouth daily., Disp: , Rfl:    omeprazole (PRILOSEC) 20 MG capsule, Take 20 mg by mouth daily., Disp: , Rfl:    potassium chloride SA (KLOR-CON M) 20 MEQ tablet, Take 20 mEq by mouth daily., Disp: , Rfl:  No current facility-administered medications for this visit.  Facility-Administered Medications Ordered in Other Visits:    magnesium sulfate 2 GM/50ML IVPB, , , ,    sodium chloride flush (NS) 0.9 % injection 10 mL, 10 mL, Intracatheter, PRN, Doreatha Massed, MD, 10 mL at 11/30/22 1508   Allergies: No Known Allergies  REVIEW OF SYSTEMS:   Review of Systems  Constitutional:  Negative for chills, fatigue and fever.  HENT:   Negative for lump/mass, mouth sores, nosebleeds, sore throat and trouble swallowing.   Eyes:  Negative for eye problems.  Respiratory:  Positive for shortness of breath. Negative for cough.   Cardiovascular:  Negative for chest pain, leg swelling and palpitations.  Gastrointestinal:  Negative for abdominal pain, constipation, diarrhea, nausea and vomiting.  Genitourinary:  Negative for bladder incontinence, difficulty urinating, dysuria, frequency, hematuria and nocturia.   Musculoskeletal:  Negative for arthralgias, back pain, flank pain, myalgias and neck pain.  Skin:  Negative for itching and rash.  Neurological:  Positive for numbness (tingling in hands and feet). Negative for dizziness and headaches.  Hematological:  Does not bruise/bleed easily.  Psychiatric/Behavioral:  Negative for depression, sleep  disturbance and suicidal ideas. The patient is not nervous/anxious.   All other systems reviewed and are negative.    VITALS:   There were no vitals taken for this visit.  Wt Readings from Last 3 Encounters:  11/30/22 219 lb (99.3 kg)  11/09/22 231 lb 9.6 oz (105.1 kg)  10/25/22 232 lb (105.2 kg)    There is no height or weight on file to calculate BMI.  Performance status (ECOG): 1 - Symptomatic but completely ambulatory  PHYSICAL EXAM:   Physical Exam Vitals and nursing note reviewed. Exam conducted with a chaperone present.  Constitutional:      Appearance: Normal appearance.  Cardiovascular:     Rate and Rhythm: Normal rate and regular rhythm.     Pulses: Normal pulses.     Heart sounds: Murmur (ejection systolic) heard.  Pulmonary:     Effort: Pulmonary effort is normal.     Breath sounds: Normal breath sounds.  Abdominal:     Palpations: Abdomen is soft. There is no hepatomegaly, splenomegaly or mass.     Tenderness: There is no abdominal tenderness.  Musculoskeletal:     Right lower leg: No edema.     Left lower leg: No edema.  Lymphadenopathy:     Cervical: No cervical adenopathy.     Right cervical:  No superficial, deep or posterior cervical adenopathy.    Left cervical: No superficial, deep or posterior cervical adenopathy.     Upper Body:     Right upper body: No supraclavicular or axillary adenopathy.     Left upper body: No supraclavicular or axillary adenopathy.  Neurological:     General: No focal deficit present.     Mental Status: She is alert and oriented to person, place, and time.  Psychiatric:        Mood and Affect: Mood normal.        Behavior: Behavior normal.     LABS:      Latest Ref Rng & Units 11/30/2022   10:37 AM 11/09/2022    8:51 AM 10/19/2022    9:42 AM  CBC  WBC 4.0 - 10.5 K/uL 8.5  7.6  6.4   Hemoglobin 12.0 - 15.0 g/dL 8.9  8.7  9.4   Hematocrit 36.0 - 46.0 % 30.5  28.9  31.1   Platelets 150 - 400 K/uL 503  437  360        Latest Ref Rng & Units 11/30/2022   10:37 AM 11/09/2022    8:51 AM 10/19/2022    9:42 AM  CMP  Glucose 70 - 99 mg/dL 409  811  914   BUN 8 - 23 mg/dL 14  9  10    Creatinine 0.44 - 1.00 mg/dL 7.82  9.56  2.13   Sodium 135 - 145 mmol/L 139  138  138   Potassium 3.5 - 5.1 mmol/L 3.7  3.7  3.9   Chloride 98 - 111 mmol/L 101  106  105   CO2 22 - 32 mmol/L 26  25  26    Calcium 8.9 - 10.3 mg/dL 8.5  8.1  8.1   Total Protein 6.5 - 8.1 g/dL 6.7  5.9  5.8   Total Bilirubin 0.3 - 1.2 mg/dL 0.3  0.3  0.3   Alkaline Phos 38 - 126 U/L 82  79  78   AST 15 - 41 U/L 20  15  22    ALT 0 - 44 U/L 19  14  19       No results found for: "CEA1", "CEA" / No results found for: "CEA1", "CEA" No results found for: "PSA1" No results found for: "YQM578" No results found for: "CAN125"  No results found for: "TOTALPROTELP", "ALBUMINELP", "A1GS", "A2GS", "BETS", "BETA2SER", "GAMS", "MSPIKE", "SPEI" Lab Results  Component Value Date   TIBC 215 (L) 09/18/2022   TIBC 227 (L) 06/05/2022   FERRITIN 596 (H) 09/18/2022   FERRITIN 310 (H) 06/05/2022   IRONPCTSAT 15 09/18/2022   IRONPCTSAT 17 06/05/2022   Lab Results  Component Value Date   LDH 237 (H) 07/17/2022     STUDIES:   No results found.

## 2022-11-30 NOTE — Progress Notes (Signed)
Patient has been examined by Dr. Katragadda. Vital signs and labs have been reviewed by MD - ANC, Creatinine, LFTs, hemoglobin, and platelets are within treatment parameters per M.D. - pt may proceed with treatment.  Primary RN and pharmacy notified.  

## 2022-11-30 NOTE — Patient Instructions (Signed)
[facility]  Discharge Instructions: Thank you for choosing [facility] to provide your oncology and hematology care.  If you have a lab appointment with the Cancer Center - please note that after April 8th, 2024, all labs will be drawn in the cancer center.  You do not have to check in or register with the main entrance as you have in the past but will complete your check-in in the cancer center.  Wear comfortable clothing and clothing appropriate for easy access to any Portacath or PICC line.   We strive to give you quality time with your provider. You may need to reschedule your appointment if you arrive late (15 or more minutes).  Arriving late affects you and other patients whose appointments are after yours.  Also, if you miss three or more appointments without notifying the office, you may be dismissed from the clinic at the provider's discretion.      For prescription refill requests, have your pharmacy contact our office and allow 72 hours for refills to be completed.    Today you received the following chemotherapy and/or immunotherapy agents Keytruda and Alimta   To help prevent nausea and vomiting after your treatment, we encourage you to take your nausea medication as directed.  BELOW ARE SYMPTOMS THAT SHOULD BE REPORTED IMMEDIATELY: *FEVER GREATER THAN 100.4 F (38 C) OR HIGHER *CHILLS OR SWEATING *NAUSEA AND VOMITING THAT IS NOT CONTROLLED WITH YOUR NAUSEA MEDICATION *UNUSUAL SHORTNESS OF BREATH *UNUSUAL BRUISING OR BLEEDING *URINARY PROBLEMS (pain or burning when urinating, or frequent urination) *BOWEL PROBLEMS (unusual diarrhea, constipation, pain near the anus) TENDERNESS IN MOUTH AND THROAT WITH OR WITHOUT PRESENCE OF ULCERS (sore throat, sores in mouth, or a toothache) UNUSUAL RASH, SWELLING OR PAIN  UNUSUAL VAGINAL DISCHARGE OR ITCHING   Items with * indicate a potential emergency and should be followed up as soon as possible or go  to the Emergency Department if any problems should occur.  Please show the CHEMOTHERAPY ALERT CARD or IMMUNOTHERAPY ALERT CARD at check-in to the Emergency Department and triage nurse.  Should you have questions after your visit or need to cancel or reschedule your appointment, please contact [facility] [phone]  and follow the prompts.  Office hours are 8:00 a.m. to 4:30 p.m. Monday - Friday. Please note that voicemails left after 4:00 p.m. may not be returned until the following business day.  We are closed weekends and major holidays. You have access to a nurse at all times for urgent questions. Please call the main number to the clinic [phone] and follow the prompts.  For any non-urgent questions, you may also contact your provider using MyChart. We now offer e-Visits for anyone 60 and older to request care online for non-urgent symptoms. For details visit [email or web address].   Also download the MyChart app! Go to the app store, search "MyChart", open the app, select [place], and log in with your MyChart username and password.

## 2022-11-30 NOTE — Progress Notes (Signed)
Patient presents today for Keytruda and Alimta infusion. Patient is in satisfactory condition with no new complaints voiced.  Vital signs are stable.  Labs reviewed by Dr. Ellin Saba during the office visit and all labs are within treatment parameters.  We will proceed with treatment per MD orders.   Treatment given today per MD orders. Tolerated infusion without adverse affects. Vital signs stable. No complaints at this time. Discharged from clinic via wheelchair in stable condition. Alert and oriented x 3. F/U with [facility] as scheduled.

## 2022-11-30 NOTE — Patient Instructions (Signed)

## 2022-12-20 ENCOUNTER — Inpatient Hospital Stay: Payer: Medicare HMO

## 2022-12-20 ENCOUNTER — Inpatient Hospital Stay: Payer: Medicare HMO | Attending: Neurosurgery

## 2022-12-20 ENCOUNTER — Encounter (INDEPENDENT_AMBULATORY_CARE_PROVIDER_SITE_OTHER): Payer: Self-pay

## 2022-12-20 ENCOUNTER — Other Ambulatory Visit: Payer: Self-pay | Admitting: *Deleted

## 2022-12-20 ENCOUNTER — Inpatient Hospital Stay: Payer: Medicare HMO | Admitting: Hematology

## 2022-12-20 VITALS — BP 109/63 | HR 97 | Temp 97.6°F | Resp 19 | Wt 220.0 lb

## 2022-12-20 DIAGNOSIS — Z79899 Other long term (current) drug therapy: Secondary | ICD-10-CM | POA: Insufficient documentation

## 2022-12-20 DIAGNOSIS — Z95828 Presence of other vascular implants and grafts: Secondary | ICD-10-CM

## 2022-12-20 DIAGNOSIS — Z452 Encounter for adjustment and management of vascular access device: Secondary | ICD-10-CM | POA: Diagnosis not present

## 2022-12-20 DIAGNOSIS — C3412 Malignant neoplasm of upper lobe, left bronchus or lung: Secondary | ICD-10-CM | POA: Diagnosis not present

## 2022-12-20 DIAGNOSIS — C3492 Malignant neoplasm of unspecified part of left bronchus or lung: Secondary | ICD-10-CM

## 2022-12-20 DIAGNOSIS — D508 Other iron deficiency anemias: Secondary | ICD-10-CM

## 2022-12-20 LAB — MAGNESIUM: Magnesium: 1.7 mg/dL (ref 1.7–2.4)

## 2022-12-20 LAB — CBC WITH DIFFERENTIAL/PLATELET
Abs Immature Granulocytes: 0.05 10*3/uL (ref 0.00–0.07)
Basophils Absolute: 0 10*3/uL (ref 0.0–0.1)
Basophils Relative: 0 %
Eosinophils Absolute: 0.1 10*3/uL (ref 0.0–0.5)
Eosinophils Relative: 2 %
HCT: 28.2 % — ABNORMAL LOW (ref 36.0–46.0)
Hemoglobin: 8.5 g/dL — ABNORMAL LOW (ref 12.0–15.0)
Immature Granulocytes: 1 %
Lymphocytes Relative: 10 %
Lymphs Abs: 0.7 10*3/uL (ref 0.7–4.0)
MCH: 30.4 pg (ref 26.0–34.0)
MCHC: 30.1 g/dL (ref 30.0–36.0)
MCV: 100.7 fL — ABNORMAL HIGH (ref 80.0–100.0)
Monocytes Absolute: 0.7 10*3/uL (ref 0.1–1.0)
Monocytes Relative: 10 %
Neutro Abs: 5.2 10*3/uL (ref 1.7–7.7)
Neutrophils Relative %: 77 %
Platelets: 443 10*3/uL — ABNORMAL HIGH (ref 150–400)
RBC: 2.8 MIL/uL — ABNORMAL LOW (ref 3.87–5.11)
RDW: 17.5 % — ABNORMAL HIGH (ref 11.5–15.5)
WBC: 6.8 10*3/uL (ref 4.0–10.5)
nRBC: 0 % (ref 0.0–0.2)

## 2022-12-20 LAB — COMPREHENSIVE METABOLIC PANEL
ALT: 16 U/L (ref 0–44)
AST: 16 U/L (ref 15–41)
Albumin: 2.6 g/dL — ABNORMAL LOW (ref 3.5–5.0)
Alkaline Phosphatase: 84 U/L (ref 38–126)
Anion gap: 10 (ref 5–15)
BUN: 9 mg/dL (ref 8–23)
CO2: 26 mmol/L (ref 22–32)
Calcium: 8.4 mg/dL — ABNORMAL LOW (ref 8.9–10.3)
Chloride: 105 mmol/L (ref 98–111)
Creatinine, Ser: 0.82 mg/dL (ref 0.44–1.00)
GFR, Estimated: >60 mL/min (ref >60)
Glucose, Bld: 127 mg/dL — ABNORMAL HIGH (ref 70–99)
Potassium: 3.4 mmol/L — ABNORMAL LOW (ref 3.5–5.1)
Sodium: 141 mmol/L (ref 135–145)
Total Bilirubin: 0.2 mg/dL — ABNORMAL LOW (ref 0.3–1.2)
Total Protein: 6.2 g/dL — ABNORMAL LOW (ref 6.5–8.1)

## 2022-12-20 LAB — IRON AND TIBC
Iron: 29 ug/dL (ref 28–170)
Saturation Ratios: 12 % (ref 10.4–31.8)
TIBC: 241 ug/dL — ABNORMAL LOW (ref 250–450)
UIBC: 212 ug/dL

## 2022-12-20 LAB — TSH: TSH: 3.078 u[IU]/mL (ref 0.350–4.500)

## 2022-12-20 LAB — FERRITIN: Ferritin: 715 ng/mL — ABNORMAL HIGH (ref 11–307)

## 2022-12-20 MED ORDER — SODIUM CHLORIDE 0.9% FLUSH
10.0000 mL | INTRAVENOUS | Status: DC | PRN
  Administered 2022-12-20: 10 mL via INTRAVENOUS

## 2022-12-20 MED ORDER — AMOXICILLIN-POT CLAVULANATE 875-125 MG PO TABS: 1.0000 | ORAL_TABLET | Freq: Two times a day (BID) | ORAL | 0 refills | Status: DC

## 2022-12-20 MED ORDER — HEPARIN SOD (PORK) LOCK FLUSH 100 UNIT/ML IV SOLN
500.0000 [IU] | Freq: Once | INTRAVENOUS | Status: AC
  Administered 2022-12-20: 500 [IU] via INTRAVENOUS

## 2022-12-20 NOTE — Progress Notes (Signed)
Patients port flushed without difficulty.  Good blood return noted with no bruising or swelling noted at site. VSS. Patient remains accessed for possible treatment.   Patient is c/o increased shortness of breath x2 weeks that is affecting her ADLs and progressively getting worse. Per patient usually uses 2L of O2 but is now using 3. Patient also reports chest congestion and cough. Denies any fevers, chest pain, or other symptoms. Dr Ellin Saba made aware.

## 2022-12-20 NOTE — Progress Notes (Signed)
Chemo treatment to be held today due to patient's complaint of increased shortness of breath and productive cough over past 2 weeks. Prescription for Augmentin sent to [facility]. Treatment rescheduled to next Wednesday, patient aware and verbalized understanding. New schedule given directly to patient. Patient instructed to go to ER if symptoms become worse or she develops chest pain.

## 2022-12-20 NOTE — Patient Instructions (Signed)

## 2022-12-21 ENCOUNTER — Telehealth (INDEPENDENT_AMBULATORY_CARE_PROVIDER_SITE_OTHER): Payer: Medicare HMO | Admitting: Gastroenterology

## 2022-12-22 ENCOUNTER — Other Ambulatory Visit: Payer: Self-pay | Admitting: Hematology

## 2022-12-24 ENCOUNTER — Inpatient Hospital Stay (HOSPITAL_COMMUNITY)
Admission: EM | Admit: 2022-12-24 | Discharge: 2023-01-02 | DRG: 871 | Disposition: A | Discharge status: Home / Self Care | Payer: Medicare HMO | Attending: Internal Medicine | Admitting: Internal Medicine

## 2022-12-24 ENCOUNTER — Other Ambulatory Visit: Payer: Self-pay

## 2022-12-24 ENCOUNTER — Emergency Department (HOSPITAL_COMMUNITY): Payer: Medicare HMO

## 2022-12-24 ENCOUNTER — Encounter (HOSPITAL_COMMUNITY): Payer: Self-pay

## 2022-12-24 DIAGNOSIS — Z79899 Other long term (current) drug therapy: Secondary | ICD-10-CM

## 2022-12-24 DIAGNOSIS — Z803 Family history of malignant neoplasm of breast: Secondary | ICD-10-CM

## 2022-12-24 DIAGNOSIS — R918 Other nonspecific abnormal finding of lung field: Secondary | ICD-10-CM | POA: Diagnosis not present

## 2022-12-24 DIAGNOSIS — I4891 Unspecified atrial fibrillation: Secondary | ICD-10-CM | POA: Diagnosis present

## 2022-12-24 DIAGNOSIS — R0602 Shortness of breath: Secondary | ICD-10-CM | POA: Diagnosis not present

## 2022-12-24 DIAGNOSIS — J189 Pneumonia, unspecified organism: Secondary | ICD-10-CM | POA: Diagnosis present

## 2022-12-24 DIAGNOSIS — I2699 Other pulmonary embolism without acute cor pulmonale: Secondary | ICD-10-CM | POA: Diagnosis present

## 2022-12-24 DIAGNOSIS — Z8261 Family history of arthritis: Secondary | ICD-10-CM

## 2022-12-24 DIAGNOSIS — Z808 Family history of malignant neoplasm of other organs or systems: Secondary | ICD-10-CM

## 2022-12-24 DIAGNOSIS — J9601 Acute respiratory failure with hypoxia: Secondary | ICD-10-CM

## 2022-12-24 DIAGNOSIS — J9811 Atelectasis: Secondary | ICD-10-CM | POA: Diagnosis not present

## 2022-12-24 DIAGNOSIS — Z4682 Encounter for fitting and adjustment of non-vascular catheter: Secondary | ICD-10-CM | POA: Diagnosis not present

## 2022-12-24 DIAGNOSIS — Z1152 Encounter for screening for COVID-19: Secondary | ICD-10-CM

## 2022-12-24 DIAGNOSIS — J90 Pleural effusion, not elsewhere classified: Secondary | ICD-10-CM | POA: Diagnosis not present

## 2022-12-24 DIAGNOSIS — Z825 Family history of asthma and other chronic lower respiratory diseases: Secondary | ICD-10-CM

## 2022-12-24 DIAGNOSIS — Z86711 Personal history of pulmonary embolism: Secondary | ICD-10-CM

## 2022-12-24 DIAGNOSIS — Z8249 Family history of ischemic heart disease and other diseases of the circulatory system: Secondary | ICD-10-CM | POA: Diagnosis not present

## 2022-12-24 DIAGNOSIS — I517 Cardiomegaly: Secondary | ICD-10-CM | POA: Diagnosis not present

## 2022-12-24 DIAGNOSIS — I480 Paroxysmal atrial fibrillation: Secondary | ICD-10-CM | POA: Diagnosis present

## 2022-12-24 DIAGNOSIS — J984 Other disorders of lung: Secondary | ICD-10-CM | POA: Diagnosis not present

## 2022-12-24 DIAGNOSIS — Z87891 Personal history of nicotine dependence: Secondary | ICD-10-CM

## 2022-12-24 DIAGNOSIS — E119 Type 2 diabetes mellitus without complications: Secondary | ICD-10-CM

## 2022-12-24 DIAGNOSIS — I110 Hypertensive heart disease with heart failure: Secondary | ICD-10-CM | POA: Diagnosis present

## 2022-12-24 DIAGNOSIS — A419 Sepsis, unspecified organism: Principal | ICD-10-CM | POA: Diagnosis present

## 2022-12-24 DIAGNOSIS — D630 Anemia in neoplastic disease: Secondary | ICD-10-CM | POA: Diagnosis present

## 2022-12-24 DIAGNOSIS — Z9884 Bariatric surgery status: Secondary | ICD-10-CM

## 2022-12-24 DIAGNOSIS — I350 Nonrheumatic aortic (valve) stenosis: Secondary | ICD-10-CM | POA: Diagnosis present

## 2022-12-24 DIAGNOSIS — R0989 Other specified symptoms and signs involving the circulatory and respiratory systems: Secondary | ICD-10-CM | POA: Diagnosis not present

## 2022-12-24 DIAGNOSIS — Z923 Personal history of irradiation: Secondary | ICD-10-CM

## 2022-12-24 DIAGNOSIS — Z9981 Dependence on supplemental oxygen: Secondary | ICD-10-CM

## 2022-12-24 DIAGNOSIS — Z23 Encounter for immunization: Secondary | ICD-10-CM | POA: Diagnosis not present

## 2022-12-24 DIAGNOSIS — Z95828 Presence of other vascular implants and grafts: Secondary | ICD-10-CM

## 2022-12-24 DIAGNOSIS — Z6841 Body Mass Index (BMI) 40.0 and over, adult: Secondary | ICD-10-CM

## 2022-12-24 DIAGNOSIS — Z96612 Presence of left artificial shoulder joint: Secondary | ICD-10-CM | POA: Diagnosis present

## 2022-12-24 DIAGNOSIS — I5032 Chronic diastolic (congestive) heart failure: Secondary | ICD-10-CM | POA: Diagnosis present

## 2022-12-24 DIAGNOSIS — C3492 Malignant neoplasm of unspecified part of left bronchus or lung: Secondary | ICD-10-CM | POA: Diagnosis not present

## 2022-12-24 DIAGNOSIS — Z833 Family history of diabetes mellitus: Secondary | ICD-10-CM

## 2022-12-24 DIAGNOSIS — J9621 Acute and chronic respiratory failure with hypoxia: Secondary | ICD-10-CM | POA: Diagnosis not present

## 2022-12-24 DIAGNOSIS — I2510 Atherosclerotic heart disease of native coronary artery without angina pectoris: Secondary | ICD-10-CM | POA: Diagnosis present

## 2022-12-24 DIAGNOSIS — C787 Secondary malignant neoplasm of liver and intrahepatic bile duct: Secondary | ICD-10-CM | POA: Diagnosis not present

## 2022-12-24 DIAGNOSIS — Z96641 Presence of right artificial hip joint: Secondary | ICD-10-CM | POA: Diagnosis present

## 2022-12-24 DIAGNOSIS — Z7901 Long term (current) use of anticoagulants: Secondary | ICD-10-CM | POA: Diagnosis not present

## 2022-12-24 DIAGNOSIS — C7801 Secondary malignant neoplasm of right lung: Secondary | ICD-10-CM | POA: Diagnosis present

## 2022-12-24 DIAGNOSIS — R0902 Hypoxemia: Secondary | ICD-10-CM | POA: Diagnosis not present

## 2022-12-24 DIAGNOSIS — K219 Gastro-esophageal reflux disease without esophagitis: Secondary | ICD-10-CM | POA: Diagnosis present

## 2022-12-24 DIAGNOSIS — Z8489 Family history of other specified conditions: Secondary | ICD-10-CM

## 2022-12-24 DIAGNOSIS — I10 Essential (primary) hypertension: Secondary | ICD-10-CM | POA: Diagnosis present

## 2022-12-24 DIAGNOSIS — Z9221 Personal history of antineoplastic chemotherapy: Secondary | ICD-10-CM

## 2022-12-24 DIAGNOSIS — I700 Atherosclerosis of aorta: Secondary | ICD-10-CM | POA: Diagnosis not present

## 2022-12-24 DIAGNOSIS — Z48813 Encounter for surgical aftercare following surgery on the respiratory system: Secondary | ICD-10-CM | POA: Diagnosis not present

## 2022-12-24 DIAGNOSIS — R6520 Severe sepsis without septic shock: Secondary | ICD-10-CM | POA: Diagnosis not present

## 2022-12-24 DIAGNOSIS — Z86718 Personal history of other venous thrombosis and embolism: Secondary | ICD-10-CM

## 2022-12-24 DIAGNOSIS — C3490 Malignant neoplasm of unspecified part of unspecified bronchus or lung: Secondary | ICD-10-CM | POA: Diagnosis not present

## 2022-12-24 LAB — CBC WITH DIFFERENTIAL/PLATELET
Abs Immature Granulocytes: 0.02 10*3/uL (ref 0.00–0.07)
Basophils Absolute: 0.1 10*3/uL (ref 0.0–0.1)
Basophils Relative: 1 %
Eosinophils Absolute: 0.1 10*3/uL (ref 0.0–0.5)
Eosinophils Relative: 1 %
HCT: 31.9 % — ABNORMAL LOW (ref 36.0–46.0)
Hemoglobin: 9.5 g/dL — ABNORMAL LOW (ref 12.0–15.0)
Immature Granulocytes: 0 %
Lymphocytes Relative: 12 %
Lymphs Abs: 1.2 10*3/uL (ref 0.7–4.0)
MCH: 29.8 pg (ref 26.0–34.0)
MCHC: 29.8 g/dL — ABNORMAL LOW (ref 30.0–36.0)
MCV: 100 fL (ref 80.0–100.0)
Monocytes Absolute: 0.7 10*3/uL (ref 0.1–1.0)
Monocytes Relative: 7 %
Neutro Abs: 8.2 10*3/uL — ABNORMAL HIGH (ref 1.7–7.7)
Neutrophils Relative %: 79 %
Platelets: 463 10*3/uL — ABNORMAL HIGH (ref 150–400)
RBC: 3.19 MIL/uL — ABNORMAL LOW (ref 3.87–5.11)
RDW: 16.9 % — ABNORMAL HIGH (ref 11.5–15.5)
WBC: 10.2 10*3/uL (ref 4.0–10.5)
nRBC: 0 % (ref 0.0–0.2)

## 2022-12-24 LAB — COMPREHENSIVE METABOLIC PANEL
ALT: 13 U/L (ref 0–44)
AST: 17 U/L (ref 15–41)
Albumin: 2.7 g/dL — ABNORMAL LOW (ref 3.5–5.0)
Alkaline Phosphatase: 97 U/L (ref 38–126)
Anion gap: 11 (ref 5–15)
BUN: 10 mg/dL (ref 8–23)
CO2: 24 mmol/L (ref 22–32)
Calcium: 8.8 mg/dL — ABNORMAL LOW (ref 8.9–10.3)
Chloride: 104 mmol/L (ref 98–111)
Creatinine, Ser: 1 mg/dL (ref 0.44–1.00)
GFR, Estimated: >60 mL/min (ref >60)
Glucose, Bld: 164 mg/dL — ABNORMAL HIGH (ref 70–99)
Potassium: 3.4 mmol/L — ABNORMAL LOW (ref 3.5–5.1)
Sodium: 139 mmol/L (ref 135–145)
Total Bilirubin: 0.6 mg/dL (ref 0.3–1.2)
Total Protein: 6.8 g/dL (ref 6.5–8.1)

## 2022-12-24 LAB — TROPONIN I (HIGH SENSITIVITY)
Troponin I (High Sensitivity): 23 ng/L — ABNORMAL HIGH (ref <18)
Troponin I (High Sensitivity): 24 ng/L — ABNORMAL HIGH (ref <18)

## 2022-12-24 LAB — MAGNESIUM: Magnesium: 1.7 mg/dL (ref 1.7–2.4)

## 2022-12-24 LAB — RESP PANEL BY RT-PCR (RSV, FLU A&B, COVID)  RVPGX2
Influenza A by PCR: NEGATIVE
Influenza B by PCR: NEGATIVE
Resp Syncytial Virus by PCR: NEGATIVE
SARS Coronavirus 2 by RT PCR: NEGATIVE

## 2022-12-24 LAB — BRAIN NATRIURETIC PEPTIDE: B Natriuretic Peptide: 343 pg/mL — ABNORMAL HIGH (ref 0.0–100.0)

## 2022-12-24 LAB — LACTIC ACID, PLASMA: Lactic Acid, Venous: 0.7 mmol/L (ref 0.5–1.9)

## 2022-12-24 MED ORDER — GUAIFENESIN-DM 100-10 MG/5ML PO SYRP
5.0000 mL | ORAL_SOLUTION | ORAL | Status: DC | PRN
  Administered 2022-12-29: 5 mL via ORAL
  Filled 2022-12-24: qty 5

## 2022-12-24 MED ORDER — SODIUM CHLORIDE 0.9 % IV SOLN
500.0000 mg | INTRAVENOUS | Status: AC
  Administered 2022-12-26 – 2022-12-29 (×5): 500 mg via INTRAVENOUS
  Filled 2022-12-24 (×5): qty 5

## 2022-12-24 MED ORDER — MIDODRINE HCL 5 MG PO TABS
5.0000 mg | ORAL_TABLET | Freq: Two times a day (BID) | ORAL | Status: DC
  Administered 2022-12-25 – 2022-12-27 (×5): 5 mg via ORAL
  Filled 2022-12-24 (×5): qty 1

## 2022-12-24 MED ORDER — METOPROLOL TARTRATE 5 MG/5ML IV SOLN
5.0000 mg | Freq: Once | INTRAVENOUS | Status: AC
  Administered 2022-12-24: 5 mg via INTRAVENOUS
  Filled 2022-12-24: qty 5

## 2022-12-24 MED ORDER — METOPROLOL TARTRATE 5 MG/5ML IV SOLN
5.0000 mg | INTRAVENOUS | Status: AC | PRN
  Administered 2022-12-25 (×2): 5 mg via INTRAVENOUS
  Filled 2022-12-24 (×2): qty 5

## 2022-12-24 MED ORDER — ONDANSETRON HCL 4 MG PO TABS: 4.0000 mg | ORAL_TABLET | Freq: Four times a day (QID) | ORAL | Status: DC | PRN

## 2022-12-24 MED ORDER — IOHEXOL 350 MG/ML SOLN
75.0000 mL | Freq: Once | INTRAVENOUS | Status: AC | PRN
  Administered 2022-12-24: 75 mL via INTRAVENOUS

## 2022-12-24 MED ORDER — AZITHROMYCIN 250 MG PO TABS
500.0000 mg | ORAL_TABLET | Freq: Once | ORAL | Status: AC
  Administered 2022-12-24: 500 mg via ORAL
  Filled 2022-12-24: qty 2

## 2022-12-24 MED ORDER — POLYETHYLENE GLYCOL 3350 17 G PO PACK: 17.0000 g | PACK | Freq: Every day | ORAL | Status: DC | PRN

## 2022-12-24 MED ORDER — ONDANSETRON HCL 4 MG/2ML IJ SOLN: 4.0000 mg | Freq: Four times a day (QID) | INTRAMUSCULAR | Status: DC | PRN

## 2022-12-24 MED ORDER — SODIUM CHLORIDE 0.9 % IV SOLN
2.0000 g | INTRAVENOUS | Status: AC
  Administered 2022-12-25 – 2022-12-29 (×5): 2 g via INTRAVENOUS
  Filled 2022-12-24 (×5): qty 20

## 2022-12-24 MED ORDER — ACETAMINOPHEN 650 MG RE SUPP: 650.0000 mg | Freq: Four times a day (QID) | RECTAL | Status: DC | PRN

## 2022-12-24 MED ORDER — PANTOPRAZOLE SODIUM 40 MG PO TBEC
40.0000 mg | DELAYED_RELEASE_TABLET | Freq: Every day | ORAL | Status: DC
  Administered 2022-12-25 – 2023-01-02 (×9): 40 mg via ORAL
  Filled 2022-12-24 (×9): qty 1

## 2022-12-24 MED ORDER — ACETAMINOPHEN 325 MG PO TABS: 650.0000 mg | ORAL_TABLET | Freq: Four times a day (QID) | ORAL | Status: DC | PRN

## 2022-12-24 MED ORDER — POTASSIUM CHLORIDE CRYS ER 20 MEQ PO TBCR
40.0000 meq | EXTENDED_RELEASE_TABLET | Freq: Once | ORAL | Status: AC
  Administered 2022-12-24: 40 meq via ORAL
  Filled 2022-12-24: qty 2

## 2022-12-24 MED ORDER — SODIUM CHLORIDE 0.9 % IV SOLN
2.0000 g | Freq: Once | INTRAVENOUS | Status: AC
  Administered 2022-12-24: 2 g via INTRAVENOUS
  Filled 2022-12-24: qty 20

## 2022-12-24 NOTE — Assessment & Plan Note (Addendum)
New onset atrial fibrillation in the setting of severe sepsis.  On anticoagulation for PE.  Heart rates up to 133.  -IV metoprolol 5 mg as needed for heart rate greater than 110 -Limited echocardiogram (recent echo 07/2022 EF of 60 to 65%, grade 1 DD.) -Hold Eliquis for thoracentesis -Troponin 23> 24 -Check TSH, magnesium

## 2022-12-24 NOTE — H&P (Signed)
History and Physical    Denise [name] WJX:[id number] DOB: Oct 15, 1950 DOA: 12/24/2022  PCP: Mirna Mires, MD   Patient coming from: Home  I have personally briefly reviewed patient's old medical records in [facility]  Chief Complaint: Difficulty breathing  HPI: Denise [name] is a 72 y.o. female with medical history significant for diabetes mellitus, pulmonary embolism, aortic stenosis, hypertension, lung cancer. Patient presented to the ED with complaints of difficulty breathing over the past week, reports this morning her pain became so severe, she increased her oxygen from 2 L to 5 L without improvement.  She reports chronic unchanged bilateral lower extremity swelling.  She reports compliance with her Eliquis.  ED Course: Tmax 97.8.  Heart rate 112-133, respiratory rate 21-30.  Blood pressure systolic 99-115.  O2 sat 80% on 2 L, initially placed on nonrebreather and later weaned down to 10 L.  Sats greater than 90%.  WBC 10.2.  Lactic acid 0.7. COVID test negative. CTA chest shows extensive asymmetric groundglass and consolidative infiltrates, large left pleural effusion. IV ceftriaxone and azithromycin given.  Hospitalist to admit for multifocal PNA and large left pleural effusion.  Review of Systems: As per HPI all other systems reviewed and negative.  Past Medical History:  Diagnosis Date   Anemia    Aortic stenosis    Arthritis    Coronary artery calcification seen on CT scan    Dyspnea    Essential hypertension    GERD (gastroesophageal reflux disease)    History of lung cancer    Stage III adenocarcinoma status post chemoradiation   Port-A-Cath in place 01/06/2019   Type 2 diabetes mellitus [facility])     Past Surgical History:  Procedure Laterality Date   BIOPSY  09/25/2022   Procedure: BIOPSY;  Surgeon: Lanelle Bal, DO;  [facility]: [facility];  Service: Endoscopy;;   CHOLECYSTECTOMY  1997   COLONOSCOPY     COLONOSCOPY WITH PROPOFOL N/A 09/25/2022   Procedure:  COLONOSCOPY WITH PROPOFOL;  Surgeon: Lanelle Bal, DO;  Location: [facility];  Service: Endoscopy;  Laterality: N/A;  11:00;ASA 3   ESOPHAGEAL BRUSHING  09/25/2022   Procedure: ESOPHAGEAL BRUSHING;  Surgeon: Lanelle Bal, DO;  Location: [facility];  Service: Endoscopy;;   ESOPHAGOGASTRODUODENOSCOPY (EGD) WITH PROPOFOL N/A 09/25/2022   Procedure: ESOPHAGOGASTRODUODENOSCOPY (EGD) WITH PROPOFOL;  Surgeon: Lanelle Bal, DO;  [facility]: [facility];  Service: Endoscopy;  Laterality: N/A;  11:00AM;ASA 3   GASTRIC BYPASS     INCISIONAL HERNIA REPAIR  04/11/11   IR IMAGING GUIDED PORT INSERTION  12/27/2018   Right   IR US GUIDE BX ASP/DRAIN  08/08/2021   LAPAROSCOPIC SALPINGOOPHERECTOMY     LAPAROTOMY  04/11/2011   Procedure: EXPLORATORY LAPAROTOMY;  Surgeon: Clovis Pu. Cornett, MD;  Location: [facility] ORS;  Service: General;  Laterality: N/A;  closure port hole   POLYPECTOMY  09/25/2022   Procedure: POLYPECTOMY;  Surgeon: Lanelle Bal, DO;  Location: [facility];  Service: Endoscopy;;   RIGHT/LEFT HEART CATH AND CORONARY ANGIOGRAPHY N/A 12/29/2019   Procedure: RIGHT/LEFT HEART CATH AND CORONARY ANGIOGRAPHY;  Surgeon: Lennette Bihari, MD;  Location: [facility] LAB;  Service: Cardiovascular;  Laterality: N/A;   TOTAL HIP ARTHROPLASTY  03/07/2012   Procedure: TOTAL HIP ARTHROPLASTY ANTERIOR APPROACH;  Surgeon: Shelda Pal, MD;  Location: [facility] ORS;  Service: Orthopedics;  Laterality: Right;   TOTAL SHOULDER ARTHROPLASTY Left 01/21/2015   TOTAL SHOULDER ARTHROPLASTY Left 01/21/2015   Procedure: LEFT  TOTAL SHOULDER ARTHROPLASTY;  Surgeon: Francena Hanly, MD;  Location: [facility] OR;  Service: Orthopedics;  Laterality: Left;   VAGINAL HYSTERECTOMY       reports that she quit smoking about 46 years ago. Her smoking use included cigarettes. She started smoking about 58 years ago. She has a 12 pack-year smoking history. She has been exposed to tobacco smoke. She has never used smokeless tobacco. She  reports that she does not drink alcohol and does not use drugs.  No Known Allergies  Family History  Problem Relation Age of Onset   Breast cancer Mother    COPD Mother    Arthritis Mother    Diabetes Mother    Hypertension Mother    Hypertension Father    Diabetes Father    Breast cancer Sister    Thyroid cancer Brother    Heart attack Brother    Huntington's disease Maternal Grandmother     Prior to Admission medications   Medication Sig Start Date End Date Taking? Authorizing Provider  acetaminophen (TYLENOL) 500 MG tablet Take 500 mg by mouth every 6 (six) hours as needed for moderate pain.   Yes [provider]  amoxicillin-clavulanate (AUGMENTIN) 875-125 MG tablet Take 1 tablet by mouth 2 (two) times daily. 12/20/22  Yes Doreatha Massed, MD  Calcium Carbonate Antacid (TUMS PO) Take 4 tablets by mouth daily.   Yes [provider]  carvedilol (COREG) 3.125 MG tablet TAKE 1 TABLET BY MOUTH TWICE A DAY 09/29/22  Yes Jonelle Sidle, MD  Cyanocobalamin (VITAMIN B-12) 2500 MCG SUBL Place 2,500 mcg under the tongue every morning.    Yes [provider]  ELIQUIS 5 MG TABS tablet TAKE 1 TABLET BY MOUTH TWICE A DAY 05/22/22  Yes Doreatha Massed, MD  ferrous sulfate 325 (65 FE) MG tablet Take 325 mg by mouth every evening.   Yes [provider]  folic acid (FOLVITE) 1 MG tablet TAKE 1 TABLET BY MOUTH EVERY DAY 11/22/22  Yes Doreatha Massed, MD  furosemide (LASIX) 20 MG tablet Take 20 mg by mouth daily.   Yes [provider]  gabapentin (NEURONTIN) 600 MG tablet Take 1,200 mg by mouth at bedtime. 11/22/22  Yes [provider]  Glycerin-Hypromellose-PEG 400 (DRY EYE RELIEF DROPS OP) Apply 1 drop to eye daily as needed (dry eyes).   Yes [provider]  midodrine (PROAMATINE) 5 MG tablet Take 1 tablet (5 mg total) by mouth 2 (two) times daily with a meal. 10/25/22  Yes Jonelle Sidle, MD  Multiple Vitamin  (MULITIVITAMIN WITH MINERALS) TABS Take 1 tablet by mouth daily.   Yes [provider]  omeprazole (PRILOSEC) 20 MG capsule Take 20 mg by mouth daily. 11/18/22  Yes [provider]  potassium chloride SA (KLOR-CON M) 20 MEQ tablet Take 20 mEq by mouth daily.   Yes [provider]    Physical Exam: Vitals:   12/24/22 1715 12/24/22 1730 12/24/22 1815 12/24/22 1900  BP: 112/60 102/81 103/62 99/76  Pulse: (!) 117 (!) 114 (!) 126 (!) 127  Resp: (!) 23 (!) 21 (!) 24 (!) 30  Temp:      SpO2: 100% 97% 95% 90%    Constitutional: NAD, calm, comfortable Vitals:   12/24/22 1715 12/24/22 1730 12/24/22 1815 12/24/22 1900  BP: 112/60 102/81 103/62 99/76  Pulse: (!) 117 (!) 114 (!) 126 (!) 127  Resp: (!) 23 (!) 21 (!) 24 (!) 30  Temp:      SpO2: 100% 97%  95% 90%   Eyes: PERRL, lids and conjunctivae normal ENMT: Mucous membranes are moist.  Neck: normal, supple, no masses, no thyromegaly Respiratory: Reduced air entry left lung.  No wheezing.  Increased work of breathing tachypneic, accessory muscle use.   Cardiovascular: Tachycardic, irregular rate and rhythm, no murmurs / rubs / gallops.  Compression stockings, chronic edema.  Extremities warm.  Abdomen: no tenderness, no masses palpated. No hepatosplenomegaly. Bowel sounds positive.  Musculoskeletal: no clubbing / cyanosis. No joint deformity upper and lower extremities.  Skin: no rashes, lesions, ulcers. No induration Neurologic:  No facial symmetry, speech fluent, moving extremities spontaneously.  Psychiatric: Normal judgment and insight. Alert and oriented x 3. Normal mood.   Labs on Admission: I have personally reviewed following labs and imaging studies  CBC: Recent Labs  Lab 12/20/22 0812 12/24/22 1657  WBC 6.8 10.2  NEUTROABS 5.2 8.2*  HGB 8.5* 9.5*  HCT 28.2* 31.9*  MCV 100.7* 100.0  PLT 443* 463*   Basic Metabolic Panel: Recent Labs  Lab 12/20/22 0807 12/24/22 1657  NA 141 139  K 3.4* 3.4*   CL 105 104  CO2 26 24  GLUCOSE 127* 164*  BUN 9 10  CREATININE 0.82 1.00  CALCIUM 8.4* 8.8*  MG 1.7  --    GFR: Estimated Creatinine Clearance: 57 mL/min (by C-G formula based on SCr of 1 mg/dL). Liver Function Tests: Recent Labs  Lab 12/20/22 0807 12/24/22 1657  AST 16 17  ALT 16 13  ALKPHOS 84 97  BILITOT 0.2* 0.6  PROT 6.2* 6.8  ALBUMIN 2.6* 2.7*    Radiological Exams on Admission: CT Angio Chest PE W and/or Wo Contrast  Result Date: 12/24/2022 CLINICAL DATA:  High probability pulmonary embolism, progressive dyspnea, hypoxia. EXAM: CT ANGIOGRAPHY CHEST WITH CONTRAST TECHNIQUE: Multidetector CT imaging of the chest was performed using the standard protocol during bolus administration of intravenous contrast. Multiplanar CT image reconstructions and MIPs were obtained to evaluate the vascular anatomy. RADIATION DOSE REDUCTION: This exam was performed according to the departmental dose-optimization program which includes automated exposure control, adjustment of the mA and/or kV according to patient size and/or use of iterative reconstruction technique. CONTRAST:  75mL OMNIPAQUE IOHEXOL 350 MG/ML SOLN COMPARISON:  None Available. FINDINGS: Cardiovascular: Cardiac size is at the upper limits of normal with enlargement of the right ventricle again noted. Extensive calcification of the aortic valve leaflets. No significant coronary artery calcification. Small pericardial effusion is stable. There is adequate opacification of the pulmonary arterial tree. No intraluminal filling defect identified to suggest acute pulmonary embolism through the segmental level. The central pulmonary arteries are mildly enlarged in keeping with changes of pulmonary arterial hypertension, stable since prior examination. Mild atherosclerotic calcifications seen within the thoracic aorta. No aortic aneurysm. Right internal jugular chest port tip noted within the superior vena cava. Mediastinum/Nodes: Visualized  thyroid is unremarkable. No pathologic thoracic adenopathy. Esophagus is unremarkable. Lungs/Pleura: Cicatricial changes and chronic consolidation again noted involving the apicoposterior segment of the left upper lobe and left perihilar region in keeping with post radiation change. Large left pleural effusion is seen, enlarged since prior examination, with increasing compressive atelectasis of the left lung. There is interval development of a small left pleural effusion. Extensive asymmetric ground-glass and consolidative infiltrates are noted throughout the right lung, more prevalent within the right upper and right lower lobes likely reflecting changes of acute, multifocal infection in the appropriate clinical setting. No pneumothorax. No central obstructing lesion. Upper Abdomen: Enhancing lesions within the a  liver bilaterally, best appreciated within the right hepatic lobe, are again seen, not well delineated given early phase of contrast enhancement. Dominant lesion measures 3.7 x 3.8 cm, approximately stable since prior examination. No acute abnormality. Musculoskeletal: No acute bone abnormality. No lytic or blastic bone lesion. Review of the MIP images confirms the above findings. IMPRESSION: 1. No pulmonary embolism. 2. Extensive asymmetric ground-glass and consolidative infiltrates throughout the right lung, more prevalent within the right upper and right lower lobes likely reflecting changes of acute, multifocal infection in the appropriate clinical setting. Interval development of small possibly parapneumonic right pleural effusion. 3. Large left pleural effusion, enlarged since prior examination, with increasing compressive atelectasis of the left lung. 4. Stable post radiation changes within the left upper lobe and left perihilar region. 5. Stable enhancing lesions within the liver bilaterally, not well delineated given early phase of contrast enhancement. 6. Stable enlargement of the right ventricle  and central pulmonary arteries in keeping with changes of pulmonary arterial hypertension and elevated right heart pressure. Aortic Atherosclerosis (ICD10-I70.0). Electronically Signed   By: Helyn Numbers M.D.   On: 12/24/2022 21:02   DG Chest Portable 1 View  Result Date: 12/24/2022 CLINICAL DATA:  Increasing shortness of breath x1 week. Labored respirations. Hypoxia. Currently on chemotherapy for left non-small-cell lung cancer. EXAM: PORTABLE CHEST 1 VIEW COMPARISON:  Chest radiographs 07/17/2021, 11/19/2018, 04/11/2011; CT chest 10/09/2022 FINDINGS: Right chest wall porta catheter with tip overlying the mid superior vena cava, similar to prior. Cardiac silhouette is again mildly enlarged. Mediastinal contours are within normal limits. There is a moderate left pleural effusion, likely similar to 10/09/2022 CT but increased from 07/17/2021 radiograph. Similar superolateral left pleural thickening with associated left suprahilar density extending to the superolateral pleural surface likely corresponding to the left suprahilar postradiation changes and pleural thickening/pleural fluid seen on prior CT. Trace blunting the right costophrenic angle. No pneumothorax is seen. Minimal dextrocurvature of the midthoracic spine with mild multilevel degenerative disc changes. Partial visualization of left shoulder arthroplasty. Severe right glenohumeral osteoarthritis. IMPRESSION: 1. Posttreatment changes of radiation therapy within the left suprahilar region. Mild left pleural effusion tracking up the lateral left pleura and into the superolateral left lung radiation field appears similar to 10/09/2022 prior CT. 2. Tiny right pleural effusion. Electronically Signed   By: Neita Garnet M.D.   On: 12/24/2022 19:15    EKG: Independently reviewed.  Atrial fibrillation rate 126, QTc 478.  RBBB.   Assessment/Plan Principal Problem:   Acute on chronic respiratory failure with hypoxia (HCC) Active Problems:   Pleural  effusion, left   Multifocal pneumonia   Atrial fibrillation with RVR (HCC)   Severe sepsis (HCC)   Port-A-Cath in place   Pulmonary embolus (HCC)   Type 2 diabetes mellitus (HCC)   Essential hypertension   Assessment and Plan: * Acute on chronic respiratory failure with hypoxia (HCC) O2 sats down to 80% on 2 L, fairly on nonrebreather he currently on 10 L nasal cannula, sats greater than 90%.  Respiratory failure likely secondary to large left pleural effusion and multifocal pneumonia. -Admit to [facility], may need thoracentesis tonight, I talked to Dr. Isaiah Serge.  See full plan under pleural effusion. - ABG  Pleural effusion, left CTA chest shows large left pleural effusion enlarged since prior exam, with increasing compressive atelectasis of the left lung. (Pls see detailed report).  Patient is obviously dyspneic, tachypneic and tachycardic, with increased work of breathing. -Will admit to [facility] -I talked to PCCM- Dr. Isaiah Serge,  agree with admission to [facility] under hospitalist service, if patient becomes more dyspneic or hypoxic, [facility] on-call should call critical care for urgent thoracentesis. -Otherwise will place order for ultrasound-guided thoracentesis in a.m  + with analysis  Severe sepsis [facility]) Meeting sepsis criteria with tachycardia, heart rate 112-133, tachypnea respiratory 21-30, also in evidence of endorgan dysfunction acute on chronic respiratory failure.  Lactic acid 0.7.  WBC 10.2. -Obtain UA and urine cultures - F/u blood cultures - N/s 75cc/hr X 12 hrs  Atrial fibrillation with RVR (HCC) New onset atrial fibrillation in the setting of severe sepsis.  On anticoagulation for PE.  Heart rates up to 133.  -IV metoprolol 5 mg as needed for heart rate greater than 110 -Limited echocardiogram (recent echo 07/2022 EF of 60 to 65%, grade 1 DD.) -Hold Eliquis for thoracentesis -Troponin 23> 24 -Check TSH, magnesium  Multifocal pneumonia Multifocal pneumonia with severe sepsis.   CTA chest -extensive asymmetric groundglass and consolidative infiltrates throughout the right lung -affecting multifocal infection. Small possibly parapneumonic right pleural effusion.  Large left pleural effusion.  Lactic acid 0.7.  WBC 10.2.  COVID test negative -IV ceftriaxone and azithromycin -Follow-up blood cultures -Mucolytics as needed  Type 2 diabetes mellitus (HCC) - Daily fasting CBG - Not on meds - Hgba1c  Pulmonary embolus (HCC) CTA chest today no PE.   - Hold Eliquis for thoracentesis  Essential hypertension Soft to stable.  Systolic 99-115. Also tachycardic. -Hold home carvedilol -Hold 20 mg daily Lasix for now to allow for hydration was severely septic    DVT prophylaxis: SCDS Code Status: FULL Code- I confirmed with patient at bedside. Family Communication: None at bedside Disposition Plan: ~ > 2 days Consults called: PCCM-Dr. Mannam Admission status:  Inpt, stepdown I certify that at the point of admission it is my clinical judgment that the patient will require inpatient hospital care spanning beyond 2 midnights from the point of admission due to high intensity of service, high risk for further deterioration and high frequency of surveillance required.   Author: Onnie Boer, MD 12/24/2022 11:24 PM  For on call review [email or web address].

## 2022-12-24 NOTE — Assessment & Plan Note (Addendum)
-   Daily fasting CBG - Not on meds - Hgba1c

## 2022-12-24 NOTE — Assessment & Plan Note (Signed)
CTA chest today no PE.   - Hold Eliquis for thoracentesis

## 2022-12-24 NOTE — ED Provider Notes (Signed)
[place] EMERGENCY DEPARTMENT AT [facility] Provider Note   CSN: [id number] Arrival date & time: 12/24/22  1631     History  Chief Complaint  Patient presents with   Shortness of Breath    Denise [name] is a 72 y.o. female.  HPI 72 year old female presents with shortness of breath.  She chronically wears 2 L of oxygen but has had to bump it up to 4 for the past week or so.  She has been having progressively worsening dyspnea for several weeks.  Last week her chemo was canceled due to this shortness of breath and she was put on antibiotics for possible pneumonia.  She denies cough though she did feel congested last week.  She has chronic leg swelling this unchanged.  She has been compliant with her Eliquis, which she is on for previous blood clots.  She denies fever or chest pain.  Currently hypoxic to 80% on nasal cannula in the treatment room.  Home Medications Prior to Admission medications   Medication Sig Start Date End Date Taking? Authorizing Provider  acetaminophen (TYLENOL) 500 MG tablet Take 500 mg by mouth every 6 (six) hours as needed for moderate pain.   Yes [provider]  amoxicillin-clavulanate (AUGMENTIN) 875-125 MG tablet Take 1 tablet by mouth 2 (two) times daily. 12/20/22  Yes Doreatha Massed, MD  Calcium Carbonate Antacid (TUMS PO) Take 4 tablets by mouth daily.   Yes [provider]  carvedilol (COREG) 3.125 MG tablet TAKE 1 TABLET BY MOUTH TWICE A DAY 09/29/22  Yes Jonelle Sidle, MD  Cyanocobalamin (VITAMIN B-12) 2500 MCG SUBL Place 2,500 mcg under the tongue every morning.    Yes [provider]  ELIQUIS 5 MG TABS tablet TAKE 1 TABLET BY MOUTH TWICE A DAY 05/22/22  Yes Doreatha Massed, MD  ferrous sulfate 325 (65 FE) MG tablet Take 325 mg by mouth every evening.   Yes [provider]  folic acid (FOLVITE) 1 MG tablet TAKE 1 TABLET BY MOUTH EVERY DAY 11/22/22  Yes Doreatha Massed, MD  furosemide (LASIX)  20 MG tablet Take 20 mg by mouth daily.   Yes [provider]  gabapentin (NEURONTIN) 600 MG tablet Take 1,200 mg by mouth at bedtime. 11/22/22  Yes [provider]  Glycerin-Hypromellose-PEG 400 (DRY EYE RELIEF DROPS OP) Apply 1 drop to eye daily as needed (dry eyes).   Yes [provider]  midodrine (PROAMATINE) 5 MG tablet Take 1 tablet (5 mg total) by mouth 2 (two) times daily with a meal. 10/25/22  Yes Jonelle Sidle, MD  Multiple Vitamin (MULITIVITAMIN WITH MINERALS) TABS Take 1 tablet by mouth daily.   Yes [provider]  omeprazole (PRILOSEC) 20 MG capsule Take 20 mg by mouth daily. 11/18/22  Yes [provider]  potassium chloride SA (KLOR-CON M) 20 MEQ tablet Take 20 mEq by mouth daily.   Yes [provider]      Allergies    Patient has no known allergies.    Review of Systems   Review of Systems  Constitutional:  Negative for fever.  Respiratory:  Positive for shortness of breath. Negative for cough.   Cardiovascular:  Positive for leg swelling. Negative for chest pain.    Physical Exam Updated Vital Signs BP 99/76   Pulse (!) 127   Temp 97.8 F (36.6 C)   Resp (!) 30   SpO2 90%  Physical Exam Vitals and nursing note reviewed.  Constitutional:  Appearance: She is well-developed. She is not diaphoretic.  HENT:     Head: Normocephalic and atraumatic.  Cardiovascular:     Rate and Rhythm: Tachycardia present. Rhythm irregular.     Heart sounds: Normal heart sounds.  Pulmonary:     Effort: Accessory muscle usage present.     Breath sounds: Decreased breath sounds and rales present.     Comments: Respiratory rate and HR significantly improves with application of NRB Abdominal:     Palpations: Abdomen is soft.     Tenderness: There is no abdominal tenderness.  Musculoskeletal:     Right lower leg: Edema present.     Left lower leg: Edema present.  Skin:    General: Skin is warm and dry.  Neurological:      Mental Status: She is alert.     ED Results / Procedures / Treatments   Labs (all labs ordered are listed, but only abnormal results are displayed) Labs Reviewed  COMPREHENSIVE METABOLIC PANEL - Abnormal; Notable for the following components:      Result Value   Potassium 3.4 (*)    Glucose, Bld 164 (*)    Calcium 8.8 (*)    Albumin 2.7 (*)    All other components within normal limits  BRAIN NATRIURETIC PEPTIDE - Abnormal; Notable for the following components:   B Natriuretic Peptide 343.0 (*)    All other components within normal limits  CBC WITH DIFFERENTIAL/PLATELET - Abnormal; Notable for the following components:   RBC 3.19 (*)    Hemoglobin 9.5 (*)    HCT 31.9 (*)    MCHC 29.8 (*)    RDW 16.9 (*)    Platelets 463 (*)    Neutro Abs 8.2 (*)    All other components within normal limits  TROPONIN I (HIGH SENSITIVITY) - Abnormal; Notable for the following components:   Troponin I (High Sensitivity) 23 (*)    All other components within normal limits  TROPONIN I (HIGH SENSITIVITY) - Abnormal; Notable for the following components:   Troponin I (High Sensitivity) 24 (*)    All other components within normal limits  RESP PANEL BY RT-PCR (RSV, FLU A&B, COVID)  RVPGX2  CULTURE, BLOOD (ROUTINE X 2)  CULTURE, BLOOD (ROUTINE X 2)  LACTIC ACID, PLASMA    EKG EKG Interpretation Date/Time:  Sunday December 24 2022 17:43:01 EDT Ventricular Rate:  126 PR Interval:    QRS Duration:  121 QT Interval:  330 QTC Calculation: 478 R Axis:   246  Text Interpretation: Atrial fibrillation Right bundle branch block Anterolateral infarct, old Confirmed by Pricilla Loveless [phone]) on 12/24/2022 10:31:58 PM  Radiology CT Angio Chest PE W and/or Wo Contrast  Result Date: 12/24/2022 CLINICAL DATA:  High probability pulmonary embolism, progressive dyspnea, hypoxia. EXAM: CT ANGIOGRAPHY CHEST WITH CONTRAST TECHNIQUE: Multidetector CT imaging of the chest was performed using the standard protocol  during bolus administration of intravenous contrast. Multiplanar CT image reconstructions and MIPs were obtained to evaluate the vascular anatomy. RADIATION DOSE REDUCTION: This exam was performed according to the departmental dose-optimization program which includes automated exposure control, adjustment of the mA and/or kV according to patient size and/or use of iterative reconstruction technique. CONTRAST:  75mL OMNIPAQUE IOHEXOL 350 MG/ML SOLN COMPARISON:  None Available. FINDINGS: Cardiovascular: Cardiac size is at the upper limits of normal with enlargement of the right ventricle again noted. Extensive calcification of the aortic valve leaflets. No significant coronary artery calcification. Small pericardial effusion is stable. There is adequate opacification  of the pulmonary arterial tree. No intraluminal filling defect identified to suggest acute pulmonary embolism through the segmental level. The central pulmonary arteries are mildly enlarged in keeping with changes of pulmonary arterial hypertension, stable since prior examination. Mild atherosclerotic calcifications seen within the thoracic aorta. No aortic aneurysm. Right internal jugular chest port tip noted within the superior vena cava. Mediastinum/Nodes: Visualized thyroid is unremarkable. No pathologic thoracic adenopathy. Esophagus is unremarkable. Lungs/Pleura: Cicatricial changes and chronic consolidation again noted involving the apicoposterior segment of the left upper lobe and left perihilar region in keeping with post radiation change. Large left pleural effusion is seen, enlarged since prior examination, with increasing compressive atelectasis of the left lung. There is interval development of a small left pleural effusion. Extensive asymmetric ground-glass and consolidative infiltrates are noted throughout the right lung, more prevalent within the right upper and right lower lobes likely reflecting changes of acute, multifocal infection in  the appropriate clinical setting. No pneumothorax. No central obstructing lesion. Upper Abdomen: Enhancing lesions within the a liver bilaterally, best appreciated within the right hepatic lobe, are again seen, not well delineated given early phase of contrast enhancement. Dominant lesion measures 3.7 x 3.8 cm, approximately stable since prior examination. No acute abnormality. Musculoskeletal: No acute bone abnormality. No lytic or blastic bone lesion. Review of the MIP images confirms the above findings. IMPRESSION: 1. No pulmonary embolism. 2. Extensive asymmetric ground-glass and consolidative infiltrates throughout the right lung, more prevalent within the right upper and right lower lobes likely reflecting changes of acute, multifocal infection in the appropriate clinical setting. Interval development of small possibly parapneumonic right pleural effusion. 3. Large left pleural effusion, enlarged since prior examination, with increasing compressive atelectasis of the left lung. 4. Stable post radiation changes within the left upper lobe and left perihilar region. 5. Stable enhancing lesions within the liver bilaterally, not well delineated given early phase of contrast enhancement. 6. Stable enlargement of the right ventricle and central pulmonary arteries in keeping with changes of pulmonary arterial hypertension and elevated right heart pressure. Aortic Atherosclerosis (ICD10-I70.0). Electronically Signed   By: Helyn Numbers M.D.   On: 12/24/2022 21:02   DG Chest Portable 1 View  Result Date: 12/24/2022 CLINICAL DATA:  Increasing shortness of breath x1 week. Labored respirations. Hypoxia. Currently on chemotherapy for left non-small-cell lung cancer. EXAM: PORTABLE CHEST 1 VIEW COMPARISON:  Chest radiographs 07/17/2021, 11/19/2018, 04/11/2011; CT chest 10/09/2022 FINDINGS: Right chest wall porta catheter with tip overlying the mid superior vena cava, similar to prior. Cardiac silhouette is again mildly  enlarged. Mediastinal contours are within normal limits. There is a moderate left pleural effusion, likely similar to 10/09/2022 CT but increased from 07/17/2021 radiograph. Similar superolateral left pleural thickening with associated left suprahilar density extending to the superolateral pleural surface likely corresponding to the left suprahilar postradiation changes and pleural thickening/pleural fluid seen on prior CT. Trace blunting the right costophrenic angle. No pneumothorax is seen. Minimal dextrocurvature of the midthoracic spine with mild multilevel degenerative disc changes. Partial visualization of left shoulder arthroplasty. Severe right glenohumeral osteoarthritis. IMPRESSION: 1. Posttreatment changes of radiation therapy within the left suprahilar region. Mild left pleural effusion tracking up the lateral left pleura and into the superolateral left lung radiation field appears similar to 10/09/2022 prior CT. 2. Tiny right pleural effusion. Electronically Signed   By: Neita Garnet M.D.   On: 12/24/2022 19:15    Procedures .Critical Care  Performed by: Pricilla Loveless, MD Authorized by: Pricilla Loveless, MD   Critical  care provider statement:    Critical care time (minutes):  40   Critical care time was exclusive of:  Separately billable procedures and treating other patients   Critical care was necessary to treat or prevent imminent or life-threatening deterioration of the following conditions:  Respiratory failure and sepsis   Critical care was time spent personally by me on the following activities:  Development of treatment plan with patient or surrogate, discussions with consultants, evaluation of patient's response to treatment, examination of patient, ordering and review of laboratory studies, ordering and review of radiographic studies, ordering and performing treatments and interventions, pulse oximetry, re-evaluation of patient's condition and review of old charts      Medications Ordered in ED Medications  metoprolol tartrate (LOPRESSOR) injection 5 mg (has no administration in time range)  cefTRIAXone (ROCEPHIN) 2 g in sodium chloride 0.9 % 100 mL IVPB (0 g Intravenous Stopped 12/24/22 2013)  azithromycin (ZITHROMAX) tablet 500 mg (500 mg Oral Given 12/24/22 1930)  iohexol (OMNIPAQUE) 350 MG/ML injection 75 mL (75 mLs Intravenous Contrast Given 12/24/22 1950)    ED Course/ Medical Decision Making/ A&P                                 Medical Decision Making Amount and/or Complexity of Data Reviewed Labs: ordered. Radiology: ordered and independent interpretation performed.    Details: Left pleural effusion.  Pneumonia. ECG/medicine tests: ordered and independent interpretation performed.    Details: A-fib  Risk Prescription drug management. Decision regarding hospitalization.   Patient presents with acute on chronic respiratory failure.  She was placed on nonrebreather though after this we were able to wean her down and placed her on high flow nasal cannula.  She is not having much better respiratory work.  Her blood pressures have been soft in the 90s and low 100s but she states that is normal for her.  She is feeling a lot better.  X-ray is concerning for pneumonia and pleural effusion but also cancer.  CTA does not show a PE but does show pneumonia and a large pleural effusion.  I think given the improvement in her respiratory status I do not think she needs an immediate thoracentesis and this can wait until the morning.  Otherwise she was treated broadly with IV antibiotics.  Her heart rate has waxed and waned, consistent with A-fib.  Lactate is normal.  I think she is stable for admission to the hospitalist service.  Discussed with Dr. Mariea Clonts.        Final Clinical Impression(s) / ED Diagnoses Final diagnoses:  Acute on chronic respiratory failure with hypoxia (HCC)  Multifocal pneumonia  Pleural effusion on left    Rx / DC  Orders ED Discharge Orders     None         Pricilla Loveless, MD 12/24/22 2233

## 2022-12-24 NOTE — ED Notes (Signed)
Patient transported to CT 

## 2022-12-24 NOTE — Assessment & Plan Note (Addendum)
O2 sats down to 80% on 2 L, fairly on nonrebreather he currently on 10 L nasal cannula, sats greater than 90%.  Respiratory failure likely secondary to large left pleural effusion and multifocal pneumonia. -Admit to [facility], may need thoracentesis tonight, I talked to Dr. Isaiah Serge.  See full plan under pleural effusion. - ABG

## 2022-12-24 NOTE — Assessment & Plan Note (Addendum)
Multifocal pneumonia with severe sepsis.  CTA chest -extensive asymmetric groundglass and consolidative infiltrates throughout the right lung -affecting multifocal infection. Small possibly parapneumonic right pleural effusion.  Large left pleural effusion.  Lactic acid 0.7.  WBC 10.2.  COVID test negative -IV ceftriaxone and azithromycin -Follow-up blood cultures -Mucolytics as needed

## 2022-12-24 NOTE — Assessment & Plan Note (Addendum)
Soft to stable.  Systolic 99-115. Also tachycardic. -Hold home carvedilol -Hold 20 mg daily Lasix for now to allow for hydration was severely septic

## 2022-12-24 NOTE — Assessment & Plan Note (Signed)
CTA chest shows large left pleural effusion enlarged since prior exam, with increasing compressive atelectasis of the left lung. (Pls see detailed report).  Patient is obviously dyspneic, tachypneic and tachycardic, with increased work of breathing. -Will admit to [facility] -I talked to PCCM- Dr. Isaiah Serge, agree with admission to [facility] under hospitalist service, if patient becomes more dyspneic or hypoxic, [facility] on-call should call critical care for urgent thoracentesis. -Otherwise will place order for ultrasound-guided thoracentesis in a.m  + with analysis

## 2022-12-24 NOTE — ED Notes (Signed)
EKG taken during triage, handed to [facility] and is under imaging results. Taken at 16:45

## 2022-12-24 NOTE — ED Triage Notes (Addendum)
C/o increasing sob x1 week, labored respirations noted in triage.  Patient 80% on 2lpm in triage  2lpm [place] at baseline.  Denies CP, cough Pt currently on chemo.  Placed on abx 1 week ago.

## 2022-12-24 NOTE — Assessment & Plan Note (Addendum)
Meeting sepsis criteria with tachycardia, heart rate 112-133, tachypnea respiratory 21-30, also in evidence of endorgan dysfunction acute on chronic respiratory failure.  Lactic acid 0.7.  WBC 10.2. -Obtain UA and urine cultures - F/u blood cultures - N/s 75cc/hr X 12 hrs

## 2022-12-25 ENCOUNTER — Inpatient Hospital Stay (HOSPITAL_COMMUNITY): Payer: Medicare HMO

## 2022-12-25 ENCOUNTER — Encounter: Payer: Self-pay | Admitting: Hematology

## 2022-12-25 ENCOUNTER — Other Ambulatory Visit (HOSPITAL_COMMUNITY): Payer: Self-pay | Admitting: *Deleted

## 2022-12-25 ENCOUNTER — Encounter (HOSPITAL_COMMUNITY): Payer: Self-pay | Admitting: Internal Medicine

## 2022-12-25 DIAGNOSIS — J9621 Acute and chronic respiratory failure with hypoxia: Secondary | ICD-10-CM | POA: Diagnosis not present

## 2022-12-25 DIAGNOSIS — I4891 Unspecified atrial fibrillation: Secondary | ICD-10-CM | POA: Diagnosis not present

## 2022-12-25 LAB — BASIC METABOLIC PANEL
Anion gap: 10 (ref 5–15)
BUN: 10 mg/dL (ref 8–23)
CO2: 26 mmol/L (ref 22–32)
Calcium: 8.7 mg/dL — ABNORMAL LOW (ref 8.9–10.3)
Chloride: 108 mmol/L (ref 98–111)
Creatinine, Ser: 0.88 mg/dL (ref 0.44–1.00)
GFR, Estimated: >60 mL/min (ref >60)
Glucose, Bld: 125 mg/dL — ABNORMAL HIGH (ref 70–99)
Potassium: 4.3 mmol/L (ref 3.5–5.1)
Sodium: 144 mmol/L (ref 135–145)

## 2022-12-25 LAB — ECHOCARDIOGRAM LIMITED: S' Lateral: 2.4 cm

## 2022-12-25 LAB — CBC
HCT: 29 % — ABNORMAL LOW (ref 36.0–46.0)
Hemoglobin: 8.7 g/dL — ABNORMAL LOW (ref 12.0–15.0)
MCH: 30 pg (ref 26.0–34.0)
MCHC: 30 g/dL (ref 30.0–36.0)
MCV: 100 fL (ref 80.0–100.0)
Platelets: 391 10*3/uL (ref 150–400)
RBC: 2.9 MIL/uL — ABNORMAL LOW (ref 3.87–5.11)
RDW: 16.7 % — ABNORMAL HIGH (ref 11.5–15.5)
WBC: 9.6 10*3/uL (ref 4.0–10.5)
nRBC: 0 % (ref 0.0–0.2)

## 2022-12-25 LAB — BLOOD GAS, ARTERIAL
Acid-Base Excess: 3.3 mmol/L — ABNORMAL HIGH (ref 0.0–2.0)
Bicarbonate: 27.8 mmol/L (ref 20.0–28.0)
Drawn by: 10555
O2 Saturation: 97.1 %
Patient temperature: 37
pCO2 arterial: 41 mm[Hg] (ref 32–48)
pH, Arterial: 7.44 (ref 7.35–7.45)
pO2, Arterial: 83 mm[Hg] (ref 83–108)

## 2022-12-25 LAB — GLUCOSE, PLEURAL OR PERITONEAL FLUID: Glucose, Fluid: 82 mg/dL

## 2022-12-25 LAB — BODY FLUID CELL COUNT WITH DIFFERENTIAL
Eos, Fluid: 3 %
Lymphs, Fluid: 32 %
Monocyte-Macrophage-Serous Fluid: 5 % — ABNORMAL LOW (ref 50–90)
Neutrophil Count, Fluid: 60 % — ABNORMAL HIGH (ref 0–25)
Total Nucleated Cell Count, Fluid: 1332 uL — ABNORMAL HIGH (ref 0–1000)

## 2022-12-25 LAB — PROTEIN, PLEURAL OR PERITONEAL FLUID: Total protein, fluid: 3.8 g/dL

## 2022-12-25 LAB — GRAM STAIN: Gram Stain: NONE SEEN

## 2022-12-25 LAB — GLUCOSE, CAPILLARY: Glucose-Capillary: 86 mg/dL (ref 70–99)

## 2022-12-25 LAB — TSH: TSH: 2.832 u[IU]/mL (ref 0.350–4.500)

## 2022-12-25 LAB — LACTATE DEHYDROGENASE, PLEURAL OR PERITONEAL FLUID: LD, Fluid: 278 U/L — ABNORMAL HIGH (ref 3–23)

## 2022-12-25 LAB — LACTATE DEHYDROGENASE: LDH: 185 U/L (ref 98–192)

## 2022-12-25 MED ORDER — IPRATROPIUM-ALBUTEROL 0.5-2.5 (3) MG/3ML IN SOLN
3.0000 mL | Freq: Four times a day (QID) | RESPIRATORY_TRACT | Status: DC
  Administered 2022-12-25 – 2022-12-26 (×2): 3 mL via RESPIRATORY_TRACT
  Filled 2022-12-25 (×2): qty 3

## 2022-12-25 MED ORDER — DILTIAZEM HCL 30 MG PO TABS
30.0000 mg | ORAL_TABLET | Freq: Four times a day (QID) | ORAL | Status: DC
  Administered 2022-12-25 – 2022-12-28 (×10): 30 mg via ORAL
  Filled 2022-12-25 (×10): qty 1

## 2022-12-25 MED ORDER — LIDOCAINE HCL (PF) 2 % IJ SOLN
INTRAMUSCULAR | Status: AC
  Filled 2022-12-25: qty 10

## 2022-12-25 MED ORDER — FUROSEMIDE 10 MG/ML IJ SOLN
20.0000 mg | Freq: Two times a day (BID) | INTRAMUSCULAR | Status: DC
  Administered 2022-12-25 – 2022-12-27 (×5): 20 mg via INTRAVENOUS
  Filled 2022-12-25 (×5): qty 2

## 2022-12-25 MED ORDER — APIXABAN 5 MG PO TABS
5.0000 mg | ORAL_TABLET | Freq: Two times a day (BID) | ORAL | Status: DC
  Administered 2022-12-25 – 2022-12-28 (×6): 5 mg via ORAL
  Filled 2022-12-25 (×6): qty 1

## 2022-12-25 MED ORDER — METOPROLOL TARTRATE 5 MG/5ML IV SOLN
5.0000 mg | INTRAVENOUS | Status: DC | PRN
  Administered 2022-12-25: 5 mg via INTRAVENOUS
  Filled 2022-12-25: qty 5

## 2022-12-25 MED ORDER — INFLUENZA VAC A&B SURF ANT ADJ 0.5 ML IM SUSY
0.5000 mL | PREFILLED_SYRINGE | INTRAMUSCULAR | Status: AC
  Administered 2022-12-26: 0.5 mL via INTRAMUSCULAR
  Filled 2022-12-25: qty 0.5

## 2022-12-25 MED ORDER — CARVEDILOL 6.25 MG PO TABS
6.2500 mg | ORAL_TABLET | Freq: Two times a day (BID) | ORAL | Status: DC
  Administered 2022-12-25 – 2022-12-29 (×8): 6.25 mg via ORAL
  Filled 2022-12-25 (×8): qty 1

## 2022-12-25 MED ORDER — ALBUTEROL SULFATE (2.5 MG/3ML) 0.083% IN NEBU: 2.5000 mg | INHALATION_SOLUTION | RESPIRATORY_TRACT | Status: DC | PRN

## 2022-12-25 MED ORDER — LIDOCAINE HCL (PF) 2 % IJ SOLN
10.0000 mL | Freq: Once | INTRAMUSCULAR | Status: AC
  Administered 2022-12-25: 10 mL

## 2022-12-25 MED ORDER — DM-GUAIFENESIN ER 30-600 MG PO TB12
1.0000 | ORAL_TABLET | Freq: Two times a day (BID) | ORAL | Status: DC
  Administered 2022-12-25 – 2023-01-02 (×16): 1 via ORAL
  Filled 2022-12-25 (×16): qty 1

## 2022-12-25 NOTE — ED Notes (Signed)
ED TO INPATIENT HANDOFF REPORT  ED Nurse Name and Phone #: Andie Mortimer  S Name/Age/Gender Denise [name] 72 y.o. female Room/Bed: APA04/APA04  Code Status   Code Status: Full Code  Home/SNF/Other Home Patient oriented to: self, place, time, and situation Is this baseline? Yes   Triage Complete: Triage complete  Chief Complaint Acute on chronic respiratory failure with hypoxia (HCC) [J96.21]  Triage Note C/o increasing sob x1 week, labored respirations noted in triage.  Patient 80% on 2lpm in triage  2lpm [place] at baseline.  Denies CP, cough Pt currently on chemo.  Placed on abx 1 week ago.    Allergies No Known Allergies  Level of Care/Admitting Diagnosis ED Disposition     ED Disposition  Admit   Condition  --   Comment  Hospital Area: [facility] [[id number]]  Level of Care: Progressive [102]  Admit to Progressive based on following criteria: CARDIOVASCULAR & THORACIC of moderate stability with acute coronary syndrome symptoms/low risk myocardial infarction/hypertensive urgency/arrhythmias/heart failure potentially compromising stability and stable post cardiovascular intervention patients.  May admit patient to [facility] or [facility] if equivalent level of care is available:: Yes  Covid Evaluation: Asymptomatic - no recent exposure (last 10 days) testing not required  Diagnosis: Acute on chronic respiratory failure with hypoxia [facility]) [[id number]]  Admitting Physician: Cresenciano Lick  Attending Physician: Onnie Boer [phone]  Certification:: I certify this patient will need inpatient services for at least 2 midnights  Expected Medical Readiness: 12/27/2022          B Medical/Surgery History Past Medical History:  Diagnosis Date   Anemia    Aortic stenosis    Arthritis    Coronary artery calcification seen on CT scan    Dyspnea    Essential hypertension    GERD (gastroesophageal reflux disease)    History of lung  cancer    Stage III adenocarcinoma status post chemoradiation   Port-A-Cath in place 01/06/2019   Type 2 diabetes mellitus (HCC)    Past Surgical History:  Procedure Laterality Date   BIOPSY  09/25/2022   Procedure: BIOPSY;  Surgeon: Lanelle Bal, DO;  [facility];  Service: Endoscopy;;   CHOLECYSTECTOMY  1997   COLONOSCOPY     COLONOSCOPY WITH PROPOFOL N/A 09/25/2022   Procedure: COLONOSCOPY WITH PROPOFOL;  Surgeon: Lanelle Bal, DO;  [facility];  Service: Endoscopy;  Laterality: N/A;  11:00;ASA 3   ESOPHAGEAL BRUSHING  09/25/2022   Procedure: ESOPHAGEAL BRUSHING;  Surgeon: Lanelle Bal, DO;  [facility];  Service: Endoscopy;;   ESOPHAGOGASTRODUODENOSCOPY (EGD) WITH PROPOFOL N/A 09/25/2022   Procedure: ESOPHAGOGASTRODUODENOSCOPY (EGD) WITH PROPOFOL;  Surgeon: Lanelle Bal, DO;  [facility];  Service: Endoscopy;  Laterality: N/A;  11:00AM;ASA 3   GASTRIC BYPASS     INCISIONAL HERNIA REPAIR  04/11/11   IR IMAGING GUIDED PORT INSERTION  12/27/2018   Right   IR US GUIDE BX ASP/DRAIN  08/08/2021   LAPAROSCOPIC SALPINGOOPHERECTOMY     LAPAROTOMY  04/11/2011   Procedure: EXPLORATORY LAPAROTOMY;  Surgeon: Clovis Pu. Cornett, MD;  Location: [facility];  Service: General;  Laterality: N/A;  closure port hole   POLYPECTOMY  09/25/2022   Procedure: POLYPECTOMY;  Surgeon: Lanelle Bal, DO;  [facility];  Service: Endoscopy;;   RIGHT/LEFT HEART CATH AND CORONARY ANGIOGRAPHY N/A 12/29/2019   Procedure: RIGHT/LEFT HEART CATH AND CORONARY ANGIOGRAPHY;  Surgeon: Lennette Bihari, MD;  Location: [facility] INVAS[facility];  Service: Cardiovascular;  Laterality: N/A;   TOTAL HIP ARTHROPLASTY  03/07/2012   Procedure: TOTAL HIP ARTHROPLASTY ANTERIOR APPROACH;  Surgeon: Shelda Pal, MD;  Location: [facility];  Service: Orthopedics;  Laterality: Right;   TOTAL SHOULDER ARTHROPLASTY Left 01/21/2015   TOTAL SHOULDER ARTHROPLASTY Left 01/21/2015    Procedure: LEFT TOTAL SHOULDER ARTHROPLASTY;  Surgeon: Francena Hanly, MD;  Location: [facility] OR;  Service: Orthopedics;  Laterality: Left;   VAGINAL HYSTERECTOMY       A IV Location/Drains/Wounds Patient Lines/Drains/Airways Status     Active Line/Drains/Airways     Name Placement date Placement time Site Days   Implanted Port 12/27/18 Right Chest 12/27/18  1522  Chest  1459   Implanted Port Right Chest --  --  Chest  --   Peripheral IV 12/24/22 20 G Anterior;Proximal;Right Forearm 12/24/22  1702  Forearm  1   Wound / Incision (Open or Dehisced) 11/19/18 Puncture Back Lateral;Left;Upper 11/19/18  1258  Back  1497            Intake/Output Last 24 hours  Intake/Output Summary (Last 24 hours) at 12/25/2022 1631 Last data filed at 12/24/2022 2013 Gross per 24 hour  Intake 100 ml  Output --  Net 100 ml    Labs/Imaging Results for orders placed or performed during the hospital encounter of 12/24/22 (from the past 48 hour(s))  Resp panel by RT-PCR (RSV, Flu A&B, Covid) Anterior Nasal Swab     Status: None   Collection Time: 12/24/22  4:01 PM   Specimen: Anterior Nasal Swab  Result Value Ref Range   SARS Coronavirus 2 by RT PCR NEGATIVE NEGATIVE    Comment: (NOTE) SARS-CoV-2 target nucleic acids are NOT DETECTED.  The SARS-CoV-2 RNA is generally detectable in upper respiratory specimens during the acute phase of infection. The lowest concentration of SARS-CoV-2 viral copies this assay can detect is 138 copies/mL. A negative result does not preclude SARS-Cov-2 infection and should not be used as the sole basis for treatment or other patient management decisions. A negative result may occur with  improper specimen collection/handling, submission of specimen other than nasopharyngeal swab, presence of viral mutation(s) within the areas targeted by this assay, and inadequate number of viral copies(<138 copies/mL). A negative result must be combined with clinical observations, patient  history, and epidemiological information. The expected result is Negative.  Fact Sheet for Patients:  BloggerCourse.com  Fact Sheet for Healthcare Providers:  SeriousBroker.it  This test is no t yet approved or cleared by the [place] FDA and  has been authorized for detection and/or diagnosis of SARS-CoV-2 by FDA under an Emergency Use Authorization (EUA). This EUA will remain  in effect (meaning this test can be used) for the duration of the COVID-19 declaration under Section 564(b)(1) of the Act, 21 U.S.C.section 360bbb-3(b)(1), unless the authorization is terminated  or revoked sooner.       Influenza A by PCR NEGATIVE NEGATIVE   Influenza B by PCR NEGATIVE NEGATIVE    Comment: (NOTE) The Xpert Xpress SARS-CoV-2/FLU/RSV plus assay is intended as an aid in the diagnosis of influenza from Nasopharyngeal swab specimens and should not be used as a sole basis for treatment. Nasal washings and aspirates are unacceptable for Xpert Xpress SARS-CoV-2/FLU/RSV testing.  Fact Sheet for Patients: BloggerCourse.com  Fact Sheet for Healthcare Providers: SeriousBroker.it  This test is not yet approved or cleared by the [place] FDA and has been authorized for detection and/or diagnosis of SARS-CoV-2  by FDA under an Emergency Use Authorization (EUA). This EUA will remain in effect (meaning this test can be used) for the duration of the COVID-19 declaration under Section 564(b)(1) of the Act, 21 U.S.C. section 360bbb-3(b)(1), unless the authorization is terminated or revoked.     Resp Syncytial Virus by PCR NEGATIVE NEGATIVE    Comment: (NOTE) Fact Sheet for Patients: BloggerCourse.com  Fact Sheet for Healthcare Providers: SeriousBroker.it  This test is not yet approved or cleared by the [place] FDA and has been  authorized for detection and/or diagnosis of SARS-CoV-2 by FDA under an Emergency Use Authorization (EUA). This EUA will remain in effect (meaning this test can be used) for the duration of the COVID-19 declaration under Section 564(b)(1) of the Act, 21 U.S.C. section 360bbb-3(b)(1), unless the authorization is terminated or revoked.  Performed at [facility], [address], [place], [place] [address]   Comprehensive metabolic panel     Status: Abnormal   Collection Time: 12/24/22  4:57 PM  Result Value Ref Range   Sodium 139 135 - 145 mmol/L   Potassium 3.4 (L) 3.5 - 5.1 mmol/L   Chloride 104 98 - 111 mmol/L   CO2 24 22 - 32 mmol/L   Glucose, Bld 164 (H) 70 - 99 mg/dL    Comment: Glucose reference range applies only to samples taken after fasting for at least 8 hours.   BUN 10 8 - 23 mg/dL   Creatinine, Ser 6.57 0.44 - 1.00 mg/dL   Calcium 8.8 (L) 8.9 - 10.3 mg/dL   Total Protein 6.8 6.5 - 8.1 g/dL   Albumin 2.7 (L) 3.5 - 5.0 g/dL   AST 17 15 - 41 U/L   ALT 13 0 - 44 U/L   Alkaline Phosphatase 97 38 - 126 U/L   Total Bilirubin 0.6 0.3 - 1.2 mg/dL   GFR, Estimated >84 >69 mL/min    Comment: (NOTE) Calculated using the CKD-EPI Creatinine Equation (2021)    Anion gap 11 5 - 15    Comment: Performed at [facility], [address], [place], [place] [address]  Troponin I (High Sensitivity)     Status: Abnormal   Collection Time: 12/24/22  4:57 PM  Result Value Ref Range   Troponin I (High Sensitivity) 23 (H) <18 ng/L    Comment: (NOTE) Elevated high sensitivity troponin I (hsTnI) values and significant  changes across serial measurements may suggest ACS but many other  chronic and acute conditions are known to elevate hsTnI results.  Refer to the "Links" section for chest pain algorithms and additional  guidance. Performed at [facility], [address], [place], [place] [address]   Brain natriuretic peptide     Status: Abnormal   Collection Time: 12/24/22  4:57 PM  Result  Value Ref Range   B Natriuretic Peptide 343.0 (H) 0.0 - 100.0 pg/mL    Comment: Performed at [facility], [address], [place], [place] [address]  CBC with Differential     Status: Abnormal   Collection Time: 12/24/22  4:57 PM  Result Value Ref Range   WBC 10.2 4.0 - 10.5 K/uL   RBC 3.19 (L) 3.87 - 5.11 MIL/uL   Hemoglobin 9.5 (L) 12.0 - 15.0 g/dL   HCT 27.2 (L) 53.6 - 64.4 %   MCV 100.0 80.0 - 100.0 fL   MCH 29.8 26.0 - 34.0 pg   MCHC 29.8 (L) 30.0 - 36.0 g/dL   RDW 03.4 (H) 74.2 - 59.5 %  Platelets 463 (H) 150 - 400 K/uL   nRBC 0.0 0.0 - 0.2 %   Neutrophils Relative % 79 %   Neutro Abs 8.2 (H) 1.7 - 7.7 K/uL   Lymphocytes Relative 12 %   Lymphs Abs 1.2 0.7 - 4.0 K/uL   Monocytes Relative 7 %   Monocytes Absolute 0.7 0.1 - 1.0 K/uL   Eosinophils Relative 1 %   Eosinophils Absolute 0.1 0.0 - 0.5 K/uL   Basophils Relative 1 %   Basophils Absolute 0.1 0.0 - 0.1 K/uL   Immature Granulocytes 0 %   Abs Immature Granulocytes 0.02 0.00 - 0.07 K/uL    Comment: Performed at [facility], [address] [place], [place] [address]  Culture, blood (routine x 2)     Status: None (Preliminary result)   Collection Time: 12/24/22  6:35 PM   Specimen: Left Antecubital; Blood  Result Value Ref Range   Specimen Description LEFT ANTECUBITAL    Special Requests NONE    Culture      NO GROWTH < 12 HOURS Performed at [facility], [address] [place], [place] [address]    Report Status PENDING   Lactic acid, plasma     Status: None   Collection Time: 12/24/22  6:35 PM  Result Value Ref Range   Lactic Acid, Venous 0.7 0.5 - 1.9 mmol/L    Comment: Performed at [facility], [address], [place], [place] [address]  Troponin I (High Sensitivity)     Status: Abnormal   Collection Time: 12/24/22  6:35 PM  Result Value Ref Range   Troponin I (High Sensitivity) 24 (H) <18 ng/L    Comment: (NOTE) Elevated high sensitivity troponin I (hsTnI) values and significant  changes across serial  measurements may suggest ACS but many other  chronic and acute conditions are known to elevate hsTnI results.  Refer to the "Links" section for chest pain algorithms and additional  guidance. Performed at [facility], [address] [place], [place] [address]   Culture, blood (routine x 2)     Status: None (Preliminary result)   Collection Time: 12/24/22  8:31 PM   Specimen: Left Antecubital; Blood  Result Value Ref Range   Specimen Description LEFT ANTECUBITAL BLOOD    Special Requests      BOTTLES DRAWN AEROBIC ONLY Blood Culture adequate volume   Culture      NO GROWTH < 12 HOURS Performed at [facility], [address], [place], [place] [address]    Report Status PENDING   Magnesium     Status: None   Collection Time: 12/24/22  8:31 PM  Result Value Ref Range   Magnesium 1.7 1.7 - 2.4 mg/dL    Comment: Performed at [facility], [address], [place], [place] [address]  TSH     Status: None   Collection Time: 12/24/22  8:31 PM  Result Value Ref Range   TSH 2.832 0.350 - 4.500 uIU/mL    Comment: Performed by a 3rd Generation assay with a functional sensitivity of <=0.01 uIU/mL. Performed at [facility], [address], [place], [place] [address]   Blood gas, arterial     Status: Abnormal   Collection Time: 12/24/22 11:32 PM  Result Value Ref Range   pH, Arterial 7.44 7.35 - 7.45   pCO2 arterial 41 32 - 48 mmHg   pO2, Arterial 83 83 - 108 mmHg   Bicarbonate 27.8 20.0 - 28.0 mmol/L   Acid-Base Excess 3.3 (H) 0.0 -  2.0 mmol/L   O2 Saturation 97.1 %   Patient temperature 37.0    Collection site LEFT RADIAL    Drawn by [id number]    Allens test (pass/fail) PASS PASS    Comment: Performed at [facility], [address], [place], [place] [address]  Basic metabolic panel     Status: Abnormal   Collection Time: 12/25/22  3:14 AM  Result Value Ref Range   Sodium 144 135 - 145 mmol/L   Potassium 4.3 3.5 - 5.1 mmol/L   Chloride 108 98 - 111 mmol/L   CO2 26 22 - 32 mmol/L    Glucose, Bld 125 (H) 70 - 99 mg/dL    Comment: Glucose reference range applies only to samples taken after fasting for at least 8 hours.   BUN 10 8 - 23 mg/dL   Creatinine, Ser 0.98 0.44 - 1.00 mg/dL   Calcium 8.7 (L) 8.9 - 10.3 mg/dL   GFR, Estimated >11 >91 mL/min    Comment: (NOTE) Calculated using the CKD-EPI Creatinine Equation (2021)    Anion gap 10 5 - 15    Comment: Performed at [facility], [address] [place], [place] [address]  CBC     Status: Abnormal   Collection Time: 12/25/22  3:14 AM  Result Value Ref Range   WBC 9.6 4.0 - 10.5 K/uL   RBC 2.90 (L) 3.87 - 5.11 MIL/uL   Hemoglobin 8.7 (L) 12.0 - 15.0 g/dL   HCT 56.2 (L) 13.0 - 86.5 %   MCV 100.0 80.0 - 100.0 fL   MCH 30.0 26.0 - 34.0 pg   MCHC 30.0 30.0 - 36.0 g/dL   RDW 78.4 (H) 69.6 - 29.5 %   Platelets 391 150 - 400 K/uL   nRBC 0.0 0.0 - 0.2 %    Comment: Performed at [facility], [address] [place], [place] [address]  Lactate dehydrogenase     Status: None   Collection Time: 12/25/22  3:14 AM  Result Value Ref Range   LDH 185 98 - 192 U/L    Comment: Performed at [facility], [address], [place], [place] [address]  Body fluid cell count with differential     Status: Abnormal   Collection Time: 12/25/22 10:35 AM  Result Value Ref Range   Fluid Type-FCT PLEURAL     Comment: CORRECTED ON 10/07 AT 1115: PREVIOUSLY REPORTED AS Pleural, L   Color, Fluid RED (A) YELLOW   Appearance, Fluid CLOUDY (A) CLEAR   Total Nucleated Cell Count, Fluid 1,332 (H) 0 - 1,000 cu mm   Neutrophil Count, Fluid 60 (H) 0 - 25 %   Lymphs, Fluid 32 %   Monocyte-Macrophage-Serous Fluid 5 (L) 50 - 90 %   Eos, Fluid 3 %    Comment: Performed at [facility], [address] [place], [place] [address]  Glucose, pleural or peritoneal fluid     Status: None   Collection Time: 12/25/22 10:35 AM  Result Value Ref Range   Glucose, Fluid 82 mg/dL    Comment: (NOTE) No normal range established for this test Results should be  evaluated in conjunction with serum values    Fluid Type-FGLU PLEURAL     Comment: Performed at [facility], [address], [place], [place] [address] CORRECTED ON 10/07 AT 1115: PREVIOUSLY REPORTED AS Pleural, L   Lactate dehydrogenase (pleural or peritoneal fluid)     Status: Abnormal   Collection Time: 12/25/22 10:35 AM  Result Value Ref Range  LD, Fluid 278 (H) 3 - 23 U/L    Comment: (NOTE) Results should be evaluated in conjunction with serum values    Fluid Type-FLDH PLEURAL     Comment: Performed at [facility], [address] [place], [place] [address] CORRECTED ON 10/07 AT 1115: PREVIOUSLY REPORTED AS Pleural, L   Protein, pleural or peritoneal fluid     Status: None   Collection Time: 12/25/22 10:35 AM  Result Value Ref Range   Total protein, fluid 3.8 g/dL    Comment: (NOTE) No normal range established for this test Results should be evaluated in conjunction with serum values    Fluid Type-FTP PLEURAL     Comment: Performed at [facility], [address] [place], [place] [address] CORRECTED ON 10/07 AT 1115: PREVIOUSLY REPORTED AS Pleural, L   Gram stain     Status: None   Collection Time: 12/25/22 10:35 AM   Specimen: Pleura  Result Value Ref Range   Specimen Description PLEURAL    Special Requests NONE    Gram Stain      NO ORGANISMS SEEN WBC PRESENT,BOTH PMN AND MONONUCLEAR Performed at [facility], [address] [place], [place] [address]    Report Status 12/25/2022 FINAL    ECHOCARDIOGRAM LIMITED  Result Date: 12/25/2022    ECHOCARDIOGRAM LIMITED REPORT   Patient Name:   Denise [name] Date of Exam: 12/25/2022 Medical Rec #:  [id number]   Height:       62.0 in Accession #:    [id number]  Weight:       220.0 lb Date of Birth:  05-Feb-1951    BSA:          1.991 m Patient Age:    72 years    BP:           106/62 mmHg Patient Gender: F           HR:           112 bpm. [facility] Procedure: Limited Echo Indications:    Atrial Fibrillation I48.91   History:        Patient has prior history of Echocardiogram examinations, most                 recent 07/19/2022. Risk Factors:Hypertension, Diabetes and Former                 Smoker.  Sonographer:    Celesta Gentile RCS Referring Phys: [phone] Heloise Beecham EMOKPAE IMPRESSIONS  1. Limited study.  2. Left ventricular ejection fraction, by estimation, is 65 to 70%. The left ventricle has normal function. The left ventricle has no regional wall motion abnormalities. There is moderate concentric left ventricular hypertrophy. There is the interventricular septum is flattened in systole and diastole, consistent with right ventricular pressure and volume overload.  3. Right ventricular systolic function is normal. The right ventricular size is mildly enlarged.  4. A small pericardial effusion is present. The pericardial effusion is circumferential.  5. The mitral valve is degenerative. Moderate mitral annular calcification.  6. The aortic valve is tricuspid. There is moderate calcification of the aortic valve. Aortic valve appears significantly stenotic - suggest complete interrogation for further assessment.  7. The inferior vena cava is normal in size with greater than 50% respiratory variability, suggesting right atrial pressure of 3 mmHg. Comparison(s): Prior images reviewed side by side. Difficult to compare given limited study - suggest complete study for further assessment. FINDINGS  Left Ventricle: Left ventricular  ejection fraction, by estimation, is 65 to 70%. The left ventricle has normal function. The left ventricle has no regional wall motion abnormalities. The left ventricular internal cavity size was normal in size. There is  moderate concentric left ventricular hypertrophy. The interventricular septum is flattened in systole and diastole, consistent with right ventricular pressure and volume overload. Right Ventricle: The right ventricular size is mildly enlarged. No increase in right ventricular wall thickness.  Right ventricular systolic function is normal. Pericardium: A small pericardial effusion is present. The pericardial effusion is circumferential. Mitral Valve: The mitral valve is degenerative in appearance. There is mild thickening of the mitral valve leaflet(s). There is mild calcification of the mitral valve leaflet(s). Moderate mitral annular calcification. Aortic Valve: The aortic valve is tricuspid. There is moderate calcification of the aortic valve. Aorta: The aortic root is normal in size and structure. Venous: The inferior vena cava is normal in size with greater than 50% respiratory variability, suggesting right atrial pressure of 3 mmHg. LEFT VENTRICLE PLAX 2D LVIDd:         3.40 cm LVIDs:         2.40 cm LV PW:         1.40 cm LV IVS:        1.40 cm LVOT diam:     1.70 cm LVOT Area:     2.27 cm  RIGHT VENTRICLE TAPSE (M-mode): 1.8 cm LEFT ATRIUM         Index LA diam:    3.20 cm 1.61 cm/m   AORTA Ao Root diam: 3.00 cm  SHUNTS Systemic Diam: 1.70 cm Nona Dell MD Electronically signed by Nona Dell MD Signature Date/Time: 12/25/2022/4:01:55 PM    Final    DG Chest 1 View  Result Date: 12/25/2022 CLINICAL DATA:  Status post thoracentesis. Non-small cell lung cancer. EXAM: CHEST  1 VIEW COMPARISON:  12/24/2022 FINDINGS: Reduced size of the left pleural effusion. No pneumothorax. Right power injectable Port-A-Cath tip: SVC. Atherosclerotic calcification of the aortic arch. Continued indistinctness of the left heart border. Continued left perihilar and lower lobe airspace opacity Mild blunting of the right costophrenic angle compatible with small right pleural effusion. Hazy density in the right upper lobe and right lower lobe compatible with the ground-glass opacities and mild infrahilar airspace opacity shown on yesterday's chest CT. IMPRESSION: 1. Reduced size of the left pleural effusion, without pneumothorax. 2. Continued left perihilar and lower lobe airspace opacity. 3. Small right  pleural effusion. 4. Hazy density in the right upper lobe and right lower lobe compatible with the ground-glass opacities and mild infrahilar airspace opacity shown on yesterday's chest CT. Electronically Signed   By: Gaylyn Rong M.D.   On: 12/25/2022 13:06   US THORACENTESIS ASP PLEURAL SPACE W/IMG GUIDE  Result Date: 12/25/2022 INDICATION: Left pleural effusion. EXAM: ULTRASOUND GUIDED left THORACENTESIS MEDICATIONS: None. COMPLICATIONS: None immediate. PROCEDURE: An ultrasound guided thoracentesis was thoroughly discussed with the patient and questions answered. The benefits, risks, alternatives and complications were also discussed. The patient understands and wishes to proceed with the procedure. Written consent was obtained. Ultrasound was performed to localize and mark an adequate pocket of fluid in the left chest. The area was then prepped and draped in the normal sterile fashion. 1% Lidocaine was used for local anesthesia. Under ultrasound guidance a Safe-T-Centesis catheter was introduced. Thoracentesis was performed. The catheter was removed and a dressing applied. FINDINGS: A total of approximately 850 mL of bloody fluid was removed. Samples were sent to  the laboratory as requested by the clinical team. IMPRESSION: Successful ultrasound guided left thoracentesis yielding 850 mL of pleural fluid. Electronically Signed   By: Lupita Raider M.D.   On: 12/25/2022 10:52   CT Angio Chest PE W and/or Wo Contrast  Result Date: 12/24/2022 CLINICAL DATA:  High probability pulmonary embolism, progressive dyspnea, hypoxia. EXAM: CT ANGIOGRAPHY CHEST WITH CONTRAST TECHNIQUE: Multidetector CT imaging of the chest was performed using the standard protocol during bolus administration of intravenous contrast. Multiplanar CT image reconstructions and MIPs were obtained to evaluate the vascular anatomy. RADIATION DOSE REDUCTION: This exam was performed according to the departmental dose-optimization program  which includes automated exposure control, adjustment of the mA and/or kV according to patient size and/or use of iterative reconstruction technique. CONTRAST:  75mL OMNIPAQUE IOHEXOL 350 MG/ML SOLN COMPARISON:  None Available. FINDINGS: Cardiovascular: Cardiac size is at the upper limits of normal with enlargement of the right ventricle again noted. Extensive calcification of the aortic valve leaflets. No significant coronary artery calcification. Small pericardial effusion is stable. There is adequate opacification of the pulmonary arterial tree. No intraluminal filling defect identified to suggest acute pulmonary embolism through the segmental level. The central pulmonary arteries are mildly enlarged in keeping with changes of pulmonary arterial hypertension, stable since prior examination. Mild atherosclerotic calcifications seen within the thoracic aorta. No aortic aneurysm. Right internal jugular chest port tip noted within the superior vena cava. Mediastinum/Nodes: Visualized thyroid is unremarkable. No pathologic thoracic adenopathy. Esophagus is unremarkable. Lungs/Pleura: Cicatricial changes and chronic consolidation again noted involving the apicoposterior segment of the left upper lobe and left perihilar region in keeping with post radiation change. Large left pleural effusion is seen, enlarged since prior examination, with increasing compressive atelectasis of the left lung. There is interval development of a small left pleural effusion. Extensive asymmetric ground-glass and consolidative infiltrates are noted throughout the right lung, more prevalent within the right upper and right lower lobes likely reflecting changes of acute, multifocal infection in the appropriate clinical setting. No pneumothorax. No central obstructing lesion. Upper Abdomen: Enhancing lesions within the a liver bilaterally, best appreciated within the right hepatic lobe, are again seen, not well delineated given early phase of  contrast enhancement. Dominant lesion measures 3.7 x 3.8 cm, approximately stable since prior examination. No acute abnormality. Musculoskeletal: No acute bone abnormality. No lytic or blastic bone lesion. Review of the MIP images confirms the above findings. IMPRESSION: 1. No pulmonary embolism. 2. Extensive asymmetric ground-glass and consolidative infiltrates throughout the right lung, more prevalent within the right upper and right lower lobes likely reflecting changes of acute, multifocal infection in the appropriate clinical setting. Interval development of small possibly parapneumonic right pleural effusion. 3. Large left pleural effusion, enlarged since prior examination, with increasing compressive atelectasis of the left lung. 4. Stable post radiation changes within the left upper lobe and left perihilar region. 5. Stable enhancing lesions within the liver bilaterally, not well delineated given early phase of contrast enhancement. 6. Stable enlargement of the right ventricle and central pulmonary arteries in keeping with changes of pulmonary arterial hypertension and elevated right heart pressure. Aortic Atherosclerosis (ICD10-I70.0). Electronically Signed   By: Helyn Numbers M.D.   On: 12/24/2022 21:02   DG Chest Portable 1 View  Result Date: 12/24/2022 CLINICAL DATA:  Increasing shortness of breath x1 week. Labored respirations. Hypoxia. Currently on chemotherapy for left non-small-cell lung cancer. EXAM: PORTABLE CHEST 1 VIEW COMPARISON:  Chest radiographs 07/17/2021, 11/19/2018, 04/11/2011; CT chest 10/09/2022 FINDINGS: Right  chest wall porta catheter with tip overlying the mid superior vena cava, similar to prior. Cardiac silhouette is again mildly enlarged. Mediastinal contours are within normal limits. There is a moderate left pleural effusion, likely similar to 10/09/2022 CT but increased from 07/17/2021 radiograph. Similar superolateral left pleural thickening with associated left suprahilar  density extending to the superolateral pleural surface likely corresponding to the left suprahilar postradiation changes and pleural thickening/pleural fluid seen on prior CT. Trace blunting the right costophrenic angle. No pneumothorax is seen. Minimal dextrocurvature of the midthoracic spine with mild multilevel degenerative disc changes. Partial visualization of left shoulder arthroplasty. Severe right glenohumeral osteoarthritis. IMPRESSION: 1. Posttreatment changes of radiation therapy within the left suprahilar region. Mild left pleural effusion tracking up the lateral left pleura and into the superolateral left lung radiation field appears similar to 10/09/2022 prior CT. 2. Tiny right pleural effusion. Electronically Signed   By: Neita Garnet M.D.   On: 12/24/2022 19:15    Pending Labs Unresulted Labs (From admission, onward)     Start     Ordered   12/25/22 1035  Culture, body fluid w Gram Stain-bottle  Once,   R        12/25/22 1035            Vitals/Pain Today's Vitals   12/25/22 1430 12/25/22 1500 12/25/22 1545 12/25/22 1600  BP: 107/64 99/68 100/64 128/74  Pulse: (!) 117 (!) 127 83 83  Resp: (!) 37  (!) 22 (!) 21  Temp:      TempSrc:      SpO2: 93% 97% 97% 95%  PainSc:        Isolation Precautions No active isolations  Medications Medications  midodrine (PROAMATINE) tablet 5 mg (5 mg Oral Given 12/25/22 0836)  pantoprazole (PROTONIX) EC tablet 40 mg (40 mg Oral Given 12/25/22 1115)  cefTRIAXone (ROCEPHIN) 2 g in sodium chloride 0.9 % 100 mL IVPB (has no administration in time range)  azithromycin (ZITHROMAX) 500 mg in sodium chloride 0.9 % 250 mL IVPB (has no administration in time range)  guaiFENesin-dextromethorphan (ROBITUSSIN DM) 100-10 MG/5ML syrup 5 mL (has no administration in time range)  acetaminophen (TYLENOL) tablet 650 mg (has no administration in time range)    Or  acetaminophen (TYLENOL) suppository 650 mg (has no administration in time range)   ondansetron (ZOFRAN) tablet 4 mg (has no administration in time range)    Or  ondansetron (ZOFRAN) injection 4 mg (has no administration in time range)  polyethylene glycol (MIRALAX / GLYCOLAX) packet 17 g (has no administration in time range)  metoprolol tartrate (LOPRESSOR) injection 5 mg (5 mg Intravenous Given 12/25/22 1501)  cefTRIAXone (ROCEPHIN) 2 g in sodium chloride 0.9 % 100 mL IVPB (0 g Intravenous Stopped 12/24/22 2013)  azithromycin (ZITHROMAX) tablet 500 mg (500 mg Oral Given 12/24/22 1930)  iohexol (OMNIPAQUE) 350 MG/ML injection 75 mL (75 mLs Intravenous Contrast Given 12/24/22 1950)  metoprolol tartrate (LOPRESSOR) injection 5 mg (5 mg Intravenous Given 12/24/22 2252)  metoprolol tartrate (LOPRESSOR) injection 5 mg (5 mg Intravenous Given 12/25/22 1306)  potassium chloride SA (KLOR-CON M) CR tablet 40 mEq (40 mEq Oral Given 12/24/22 2336)  lidocaine HCl (PF) (XYLOCAINE) 2 % injection 10 mL (10 mLs Other Given by Other 12/25/22 1034)    Mobility walks with device     Focused Assessments Pulmonary Assessment Handoff:  Lung sounds: Bilateral Breath Sounds: Clear L Breath Sounds: Clear R Breath Sounds: Clear O2 Device: Nasal Cannula O2 Flow Rate (L/min): 6  L/min    R Recommendations: See Admitting Provider Note  Report given to:   Additional Notes: n/a

## 2022-12-25 NOTE — Progress Notes (Signed)
Patient tolerated left sided Thoracentesis procedure well today and 850 mL of bloody fluid removed and sent to lab for processing. Patient verbalized understanding of post procedure instructions and transported to chest xray via stretcher at this time with no acute distress noted.

## 2022-12-25 NOTE — Progress Notes (Signed)
*  PRELIMINARY RESULTS* Echocardiogram Limited 2-D Echocardiogram  has been performed.  Stacey Drain 12/25/2022, 2:51 PM

## 2022-12-25 NOTE — Progress Notes (Addendum)
PROGRESS NOTE   Denise [name], is a 72 y.o. female, DOB - 08-29-1950, ZOX:[id number]  Admit date - 12/24/2022   Admitting Physician Onnie Boer, MD  Outpatient Primary MD for the patient is Mirna Mires, MD  LOS - 1  Chief Complaint  Patient presents with   Shortness of Breath        Brief Narrative:  72 y.o. female with medical history significant for diabetes mellitus, pulmonary embolism, aortic stenosis, hypertension, stage IV left lung adenocarcinoma-with metastatic to the right lung and liver admitted on 12/24/2022 with acute on chronic hypoxic respiratory failure with significant left-sided pleural effusion. -Analysis of thoracentesis fluid on 12/25/2022 consistent with exudate ---- Please request official pulmonology and cardiology consult when patient arrives at [facility]    -Assessment and Plan: 1) Acute on chronic respiratory failure with hypoxia (HCC) PTA patient was on 2 L of oxygen via nasal cannula  -On admission patient required up to 10 L of oxygen via nasal cannula to keep O2 sats above 90  -After Left-sided Thoracentesis with removal of 850 mL of pleural fluid O2 sats improved and patient is currently requiring 5 L of oxygen via nasal cannula - 2)Large Pleural effusion, left CTA chest shows large left pleural effusion enlarged since prior exam, with increasing compressive atelectasis of the left lung. (Pls see detailed report).   -S/p Successful ultrasound guided left thoracentesis yielding 850 mL of pleural fluid on 12/25/2022 with fluid analysis consistent with exudate--in the patient with underlying Lung cancer and presumed superimposed pneumonia --- Please request official pulmonology consult when patient arrives at [facility] -Pleural fluid cytology Gram stain and culture pending  3)Severe sepsis due to presumed multifocal pneumonia pneumonia--POA -Extensive asymmetric ground-glass and consolidative infiltrates throughout the right  lung, more prevalent within the right upper and right lower lobes likely reflecting changes of acute, multifocal infection in the appropriate clinical setting. Interval development of small possibly parapneumonic right pleural effusion. -On Admission patient met sepsis criteria with tachycardia, heart rate 112-133, tachypnea respiratory 21-30, also in evidence of endorgan dysfunction acute on chronic respiratory failure.   -WBC not elevated but patient is maintenance compromised in setting of underlying malignancy and chemotherapy -Continue Rocephin/azithromycin pending culture data --c/n bronchodilators and mucolytics pending culture data  4)Atrial fibrillation with RVR (HCC) New onset atrial fibrillation in the setting of severe sepsis.  -Suspect patient may have underlying paroxysmal A-fib -Use Cardizem for rate control --IV metoprolol 5 mg as needed for heart rate greater than 110 -Limited echocardiogram (recent echo 07/2022 EF of 60 to 65%, grade 1 DD.) -Repeat limited echo on 12/25/2022 with EF of 65 to 70%,, moderate LVH,, and evidence of right ventricular pressure and volume overload, small pericardial effusion noted-----with possible significant aortic stenosis -Troponin 23> 24 -TSH WNL -Magnesium WNL -Already on Eliquis for history of pulmonary embolism  5)Possible significant aortic stenosis--- please see limited echo dated 12/25/2022 -Please get cardiology consult when patient arrives at [facility] may need full echo in official cardiology consult  6)DM2-A1c 6.4 reflecting good diabetic control PTA Use Novolog/Humalog Sliding scale insulin with Accu-Cheks/Fingersticks as ordered   7)Pulmonary embolus -history of unprovoked PE in the setting of underlying malignancy CTA chest today no PE.   -Continue lifelong anticoagulation  8)HTN/HFpEF-patient with history of chronic diastolic dysfunction CHF initiation echo from May 2024 -continue Coreg and Lasix  9)Chronic normocytic  anemia--in setting of underlying malignancy and chemotherapy -Hgb currently above 8 -Monitor closely especially while on Eliquis and transfuse  U.S[place]C. section 360bbb-3(b)(1), unless the authorization is terminated or revoked.     Resp Syncytial Virus by PCR NEGATIVE NEGATIVE Final    Comment: (NOTE) Fact Sheet for Patients: BloggerCourse.com  Fact Sheet for Healthcare Providers: SeriousBroker.it  This test is not yet approved or cleared by the [place] FDA and has been authorized for detection and/or diagnosis of SARS-CoV-2 by FDA under an Emergency Use Authorization (EUA). This EUA will remain in effect (meaning this test can be used) for the duration of the COVID-19 declaration under Section 564(b)(1) of the Act, 21 U.S.C. section 360bbb-3(b)(1), unless the authorization is terminated or revoked.  Performed at [facility], [address] [place], [place] [address]   Culture, blood (routine x 2)     Status: None (Preliminary result)   Collection Time: 12/24/22  6:35 PM   Specimen: Left Antecubital; Blood  Result Value Ref Range Status   Specimen Description LEFT ANTECUBITAL  Final   Special Requests NONE  Final   Culture   Final    NO GROWTH < 12 HOURS Performed at [facility], [address], [place], [place] [address]    Report Status PENDING   Incomplete  Culture, blood (routine x 2)     Status: None (Preliminary result)   Collection Time: 12/24/22  8:31 PM   Specimen: Left Antecubital; Blood  Result Value Ref Range Status   Specimen Description LEFT ANTECUBITAL BLOOD  Final   Special Requests   Final    BOTTLES DRAWN AEROBIC ONLY Blood Culture adequate volume   Culture   Final    NO GROWTH < 12 HOURS Performed at [facility], [address] [place], [place] [address]    Report Status PENDING  Incomplete  Gram stain     Status: None   Collection Time: 12/25/22 10:35 AM   Specimen: Pleura  Result Value Ref Range Status   Specimen Description PLEURAL  Final   Special Requests NONE  Final   Gram Stain   Final    NO ORGANISMS SEEN WBC PRESENT,BOTH PMN AND MONONUCLEAR Performed at [facility], [address], [place], [place] [address]    Report Status 12/25/2022 FINAL  Final      Radiology Studies: ECHOCARDIOGRAM LIMITED  Result Date: 12/25/2022    ECHOCARDIOGRAM LIMITED REPORT   Patient Name:   Denise [name] Date of Exam: 12/25/2022 Medical Rec #:  [id number]   Height:       62.0 in Accession #:    [id number]  Weight:       220.0 lb Date of Birth:  1950-05-13    BSA:          1.991 m Patient Age:    72 years    BP:           106/62 mmHg Patient Gender: F           HR:           112 bpm. [facility] Procedure: Limited Echo Indications:    Atrial Fibrillation I48.91  History:        Patient has prior history of Echocardiogram examinations, most                 recent 07/19/2022. Risk Factors:Hypertension, Diabetes and Former                 Smoker.  Sonographer:    Celesta Gentile RCS Referring Phys: [phone] Heloise Beecham Rosslyn Pasion IMPRESSIONS  1. Limited study.  2.  PROGRESS NOTE   Denise [name], is a 72 y.o. female, DOB - 08-29-1950, ZOX:[id number]  Admit date - 12/24/2022   Admitting Physician Onnie Boer, MD  Outpatient Primary MD for the patient is Mirna Mires, MD  LOS - 1  Chief Complaint  Patient presents with   Shortness of Breath        Brief Narrative:  72 y.o. female with medical history significant for diabetes mellitus, pulmonary embolism, aortic stenosis, hypertension, stage IV left lung adenocarcinoma-with metastatic to the right lung and liver admitted on 12/24/2022 with acute on chronic hypoxic respiratory failure with significant left-sided pleural effusion. -Analysis of thoracentesis fluid on 12/25/2022 consistent with exudate ---- Please request official pulmonology and cardiology consult when patient arrives at [facility]    -Assessment and Plan: 1) Acute on chronic respiratory failure with hypoxia (HCC) PTA patient was on 2 L of oxygen via nasal cannula  -On admission patient required up to 10 L of oxygen via nasal cannula to keep O2 sats above 90  -After Left-sided Thoracentesis with removal of 850 mL of pleural fluid O2 sats improved and patient is currently requiring 5 L of oxygen via nasal cannula - 2)Large Pleural effusion, left CTA chest shows large left pleural effusion enlarged since prior exam, with increasing compressive atelectasis of the left lung. (Pls see detailed report).   -S/p Successful ultrasound guided left thoracentesis yielding 850 mL of pleural fluid on 12/25/2022 with fluid analysis consistent with exudate--in the patient with underlying Lung cancer and presumed superimposed pneumonia --- Please request official pulmonology consult when patient arrives at [facility] -Pleural fluid cytology Gram stain and culture pending  3)Severe sepsis due to presumed multifocal pneumonia pneumonia--POA -Extensive asymmetric ground-glass and consolidative infiltrates throughout the right  lung, more prevalent within the right upper and right lower lobes likely reflecting changes of acute, multifocal infection in the appropriate clinical setting. Interval development of small possibly parapneumonic right pleural effusion. -On Admission patient met sepsis criteria with tachycardia, heart rate 112-133, tachypnea respiratory 21-30, also in evidence of endorgan dysfunction acute on chronic respiratory failure.   -WBC not elevated but patient is maintenance compromised in setting of underlying malignancy and chemotherapy -Continue Rocephin/azithromycin pending culture data --c/n bronchodilators and mucolytics pending culture data  4)Atrial fibrillation with RVR (HCC) New onset atrial fibrillation in the setting of severe sepsis.  -Suspect patient may have underlying paroxysmal A-fib -Use Cardizem for rate control --IV metoprolol 5 mg as needed for heart rate greater than 110 -Limited echocardiogram (recent echo 07/2022 EF of 60 to 65%, grade 1 DD.) -Repeat limited echo on 12/25/2022 with EF of 65 to 70%,, moderate LVH,, and evidence of right ventricular pressure and volume overload, small pericardial effusion noted-----with possible significant aortic stenosis -Troponin 23> 24 -TSH WNL -Magnesium WNL -Already on Eliquis for history of pulmonary embolism  5)Possible significant aortic stenosis--- please see limited echo dated 12/25/2022 -Please get cardiology consult when patient arrives at [facility] may need full echo in official cardiology consult  6)DM2-A1c 6.4 reflecting good diabetic control PTA Use Novolog/Humalog Sliding scale insulin with Accu-Cheks/Fingersticks as ordered   7)Pulmonary embolus -history of unprovoked PE in the setting of underlying malignancy CTA chest today no PE.   -Continue lifelong anticoagulation  8)HTN/HFpEF-patient with history of chronic diastolic dysfunction CHF initiation echo from May 2024 -continue Coreg and Lasix  9)Chronic normocytic  anemia--in setting of underlying malignancy and chemotherapy -Hgb currently above 8 -Monitor closely especially while on Eliquis and transfuse  U.S.C. section 360bbb-3(b)(1), unless the authorization is terminated or revoked.     Resp Syncytial Virus by PCR NEGATIVE NEGATIVE Final    Comment: (NOTE) Fact Sheet for Patients: BloggerCourse.com  Fact Sheet for Healthcare Providers: SeriousBroker.it  This test is not yet approved or cleared by the [place] FDA and has been authorized for detection and/or diagnosis of SARS-CoV-2 by FDA under an Emergency Use Authorization (EUA). This EUA will remain in effect (meaning this test can be used) for the duration of the COVID-19 declaration under Section 564(b)(1) of the Act, 21 U.S.C. section 360bbb-3(b)(1), unless the authorization is terminated or revoked.  Performed at [facility], [address] [place], [place] [address]   Culture, blood (routine x 2)     Status: None (Preliminary result)   Collection Time: 12/24/22  6:35 PM   Specimen: Left Antecubital; Blood  Result Value Ref Range Status   Specimen Description LEFT ANTECUBITAL  Final   Special Requests NONE  Final   Culture   Final    NO GROWTH < 12 HOURS Performed at [facility], [address], [place], [place] [address]    Report Status PENDING   Incomplete  Culture, blood (routine x 2)     Status: None (Preliminary result)   Collection Time: 12/24/22  8:31 PM   Specimen: Left Antecubital; Blood  Result Value Ref Range Status   Specimen Description LEFT ANTECUBITAL BLOOD  Final   Special Requests   Final    BOTTLES DRAWN AEROBIC ONLY Blood Culture adequate volume   Culture   Final    NO GROWTH < 12 HOURS Performed at [facility], [address] [place], [place] [address]    Report Status PENDING  Incomplete  Gram stain     Status: None   Collection Time: 12/25/22 10:35 AM   Specimen: Pleura  Result Value Ref Range Status   Specimen Description PLEURAL  Final   Special Requests NONE  Final   Gram Stain   Final    NO ORGANISMS SEEN WBC PRESENT,BOTH PMN AND MONONUCLEAR Performed at [facility], [address], [place], [place] [address]    Report Status 12/25/2022 FINAL  Final      Radiology Studies: ECHOCARDIOGRAM LIMITED  Result Date: 12/25/2022    ECHOCARDIOGRAM LIMITED REPORT   Patient Name:   Denise [name] Date of Exam: 12/25/2022 Medical Rec #:  [id number]   Height:       62.0 in Accession #:    [id number]  Weight:       220.0 lb Date of Birth:  1950-05-13    BSA:          1.991 m Patient Age:    72 years    BP:           106/62 mmHg Patient Gender: F           HR:           112 bpm. [facility] Procedure: Limited Echo Indications:    Atrial Fibrillation I48.91  History:        Patient has prior history of Echocardiogram examinations, most                 recent 07/19/2022. Risk Factors:Hypertension, Diabetes and Former                 Smoker.  Sonographer:    Celesta Gentile RCS Referring Phys: [phone] Heloise Beecham Rosslyn Pasion IMPRESSIONS  1. Limited study.  2.  PROGRESS NOTE   Denise [name], is a 72 y.o. female, DOB - 08-29-1950, ZOX:[id number]  Admit date - 12/24/2022   Admitting Physician Onnie Boer, MD  Outpatient Primary MD for the patient is Mirna Mires, MD  LOS - 1  Chief Complaint  Patient presents with   Shortness of Breath        Brief Narrative:  72 y.o. female with medical history significant for diabetes mellitus, pulmonary embolism, aortic stenosis, hypertension, stage IV left lung adenocarcinoma-with metastatic to the right lung and liver admitted on 12/24/2022 with acute on chronic hypoxic respiratory failure with significant left-sided pleural effusion. -Analysis of thoracentesis fluid on 12/25/2022 consistent with exudate ---- Please request official pulmonology and cardiology consult when patient arrives at [facility]    -Assessment and Plan: 1) Acute on chronic respiratory failure with hypoxia (HCC) PTA patient was on 2 L of oxygen via nasal cannula  -On admission patient required up to 10 L of oxygen via nasal cannula to keep O2 sats above 90  -After Left-sided Thoracentesis with removal of 850 mL of pleural fluid O2 sats improved and patient is currently requiring 5 L of oxygen via nasal cannula - 2)Large Pleural effusion, left CTA chest shows large left pleural effusion enlarged since prior exam, with increasing compressive atelectasis of the left lung. (Pls see detailed report).   -S/p Successful ultrasound guided left thoracentesis yielding 850 mL of pleural fluid on 12/25/2022 with fluid analysis consistent with exudate--in the patient with underlying Lung cancer and presumed superimposed pneumonia --- Please request official pulmonology consult when patient arrives at [facility] -Pleural fluid cytology Gram stain and culture pending  3)Severe sepsis due to presumed multifocal pneumonia pneumonia--POA -Extensive asymmetric ground-glass and consolidative infiltrates throughout the right  lung, more prevalent within the right upper and right lower lobes likely reflecting changes of acute, multifocal infection in the appropriate clinical setting. Interval development of small possibly parapneumonic right pleural effusion. -On Admission patient met sepsis criteria with tachycardia, heart rate 112-133, tachypnea respiratory 21-30, also in evidence of endorgan dysfunction acute on chronic respiratory failure.   -WBC not elevated but patient is maintenance compromised in setting of underlying malignancy and chemotherapy -Continue Rocephin/azithromycin pending culture data --c/n bronchodilators and mucolytics pending culture data  4)Atrial fibrillation with RVR (HCC) New onset atrial fibrillation in the setting of severe sepsis.  -Suspect patient may have underlying paroxysmal A-fib -Use Cardizem for rate control --IV metoprolol 5 mg as needed for heart rate greater than 110 -Limited echocardiogram (recent echo 07/2022 EF of 60 to 65%, grade 1 DD.) -Repeat limited echo on 12/25/2022 with EF of 65 to 70%,, moderate LVH,, and evidence of right ventricular pressure and volume overload, small pericardial effusion noted-----with possible significant aortic stenosis -Troponin 23> 24 -TSH WNL -Magnesium WNL -Already on Eliquis for history of pulmonary embolism  5)Possible significant aortic stenosis--- please see limited echo dated 12/25/2022 -Please get cardiology consult when patient arrives at [facility] may need full echo in official cardiology consult  6)DM2-A1c 6.4 reflecting good diabetic control PTA Use Novolog/Humalog Sliding scale insulin with Accu-Cheks/Fingersticks as ordered   7)Pulmonary embolus -history of unprovoked PE in the setting of underlying malignancy CTA chest today no PE.   -Continue lifelong anticoagulation  8)HTN/HFpEF-patient with history of chronic diastolic dysfunction CHF initiation echo from May 2024 -continue Coreg and Lasix  9)Chronic normocytic  anemia--in setting of underlying malignancy and chemotherapy -Hgb currently above 8 -Monitor closely especially while on Eliquis and transfuse  U.S.C. section 360bbb-3(b)(1), unless the authorization is terminated or revoked.     Resp Syncytial Virus by PCR NEGATIVE NEGATIVE Final    Comment: (NOTE) Fact Sheet for Patients: BloggerCourse.com  Fact Sheet for Healthcare Providers: SeriousBroker.it  This test is not yet approved or cleared by the [place] FDA and has been authorized for detection and/or diagnosis of SARS-CoV-2 by FDA under an Emergency Use Authorization (EUA). This EUA will remain in effect (meaning this test can be used) for the duration of the COVID-19 declaration under Section 564(b)(1) of the Act, 21 U.S.C. section 360bbb-3(b)(1), unless the authorization is terminated or revoked.  Performed at [facility], [address] [place], [place] [address]   Culture, blood (routine x 2)     Status: None (Preliminary result)   Collection Time: 12/24/22  6:35 PM   Specimen: Left Antecubital; Blood  Result Value Ref Range Status   Specimen Description LEFT ANTECUBITAL  Final   Special Requests NONE  Final   Culture   Final    NO GROWTH < 12 HOURS Performed at [facility], [address], [place], [place] [address]    Report Status PENDING   Incomplete  Culture, blood (routine x 2)     Status: None (Preliminary result)   Collection Time: 12/24/22  8:31 PM   Specimen: Left Antecubital; Blood  Result Value Ref Range Status   Specimen Description LEFT ANTECUBITAL BLOOD  Final   Special Requests   Final    BOTTLES DRAWN AEROBIC ONLY Blood Culture adequate volume   Culture   Final    NO GROWTH < 12 HOURS Performed at [facility], [address] [place], [place] [address]    Report Status PENDING  Incomplete  Gram stain     Status: None   Collection Time: 12/25/22 10:35 AM   Specimen: Pleura  Result Value Ref Range Status   Specimen Description PLEURAL  Final   Special Requests NONE  Final   Gram Stain   Final    NO ORGANISMS SEEN WBC PRESENT,BOTH PMN AND MONONUCLEAR Performed at [facility], [address], [place], [place] [address]    Report Status 12/25/2022 FINAL  Final      Radiology Studies: ECHOCARDIOGRAM LIMITED  Result Date: 12/25/2022    ECHOCARDIOGRAM LIMITED REPORT   Patient Name:   Denise [name] Date of Exam: 12/25/2022 Medical Rec #:  [id number]   Height:       62.0 in Accession #:    [id number]  Weight:       220.0 lb Date of Birth:  1950-05-13    BSA:          1.991 m Patient Age:    72 years    BP:           106/62 mmHg Patient Gender: F           HR:           112 bpm. [facility] Procedure: Limited Echo Indications:    Atrial Fibrillation I48.91  History:        Patient has prior history of Echocardiogram examinations, most                 recent 07/19/2022. Risk Factors:Hypertension, Diabetes and Former                 Smoker.  Sonographer:    Celesta Gentile RCS Referring Phys: [phone] Heloise Beecham Rosslyn Pasion IMPRESSIONS  1. Limited study.  2.  PROGRESS NOTE   Denise [name], is a 72 y.o. female, DOB - 08-29-1950, ZOX:[id number]  Admit date - 12/24/2022   Admitting Physician Onnie Boer, MD  Outpatient Primary MD for the patient is Mirna Mires, MD  LOS - 1  Chief Complaint  Patient presents with   Shortness of Breath        Brief Narrative:  72 y.o. female with medical history significant for diabetes mellitus, pulmonary embolism, aortic stenosis, hypertension, stage IV left lung adenocarcinoma-with metastatic to the right lung and liver admitted on 12/24/2022 with acute on chronic hypoxic respiratory failure with significant left-sided pleural effusion. -Analysis of thoracentesis fluid on 12/25/2022 consistent with exudate ---- Please request official pulmonology and cardiology consult when patient arrives at [facility]    -Assessment and Plan: 1) Acute on chronic respiratory failure with hypoxia (HCC) PTA patient was on 2 L of oxygen via nasal cannula  -On admission patient required up to 10 L of oxygen via nasal cannula to keep O2 sats above 90  -After Left-sided Thoracentesis with removal of 850 mL of pleural fluid O2 sats improved and patient is currently requiring 5 L of oxygen via nasal cannula - 2)Large Pleural effusion, left CTA chest shows large left pleural effusion enlarged since prior exam, with increasing compressive atelectasis of the left lung. (Pls see detailed report).   -S/p Successful ultrasound guided left thoracentesis yielding 850 mL of pleural fluid on 12/25/2022 with fluid analysis consistent with exudate--in the patient with underlying Lung cancer and presumed superimposed pneumonia --- Please request official pulmonology consult when patient arrives at [facility] -Pleural fluid cytology Gram stain and culture pending  3)Severe sepsis due to presumed multifocal pneumonia pneumonia--POA -Extensive asymmetric ground-glass and consolidative infiltrates throughout the right  lung, more prevalent within the right upper and right lower lobes likely reflecting changes of acute, multifocal infection in the appropriate clinical setting. Interval development of small possibly parapneumonic right pleural effusion. -On Admission patient met sepsis criteria with tachycardia, heart rate 112-133, tachypnea respiratory 21-30, also in evidence of endorgan dysfunction acute on chronic respiratory failure.   -WBC not elevated but patient is maintenance compromised in setting of underlying malignancy and chemotherapy -Continue Rocephin/azithromycin pending culture data --c/n bronchodilators and mucolytics pending culture data  4)Atrial fibrillation with RVR (HCC) New onset atrial fibrillation in the setting of severe sepsis.  -Suspect patient may have underlying paroxysmal A-fib -Use Cardizem for rate control --IV metoprolol 5 mg as needed for heart rate greater than 110 -Limited echocardiogram (recent echo 07/2022 EF of 60 to 65%, grade 1 DD.) -Repeat limited echo on 12/25/2022 with EF of 65 to 70%,, moderate LVH,, and evidence of right ventricular pressure and volume overload, small pericardial effusion noted-----with possible significant aortic stenosis -Troponin 23> 24 -TSH WNL -Magnesium WNL -Already on Eliquis for history of pulmonary embolism  5)Possible significant aortic stenosis--- please see limited echo dated 12/25/2022 -Please get cardiology consult when patient arrives at [facility] may need full echo in official cardiology consult  6)DM2-A1c 6.4 reflecting good diabetic control PTA Use Novolog/Humalog Sliding scale insulin with Accu-Cheks/Fingersticks as ordered   7)Pulmonary embolus -history of unprovoked PE in the setting of underlying malignancy CTA chest today no PE.   -Continue lifelong anticoagulation  8)HTN/HFpEF-patient with history of chronic diastolic dysfunction CHF initiation echo from May 2024 -continue Coreg and Lasix  9)Chronic normocytic  anemia--in setting of underlying malignancy and chemotherapy -Hgb currently above 8 -Monitor closely especially while on Eliquis and transfuse

## 2022-12-25 NOTE — ED Notes (Signed)
Pt returned from radiology.

## 2022-12-25 NOTE — TOC CM/SW Note (Signed)
Transition of Care [facility]) - Inpatient Brief Assessment   Patient Details  Name: Denise [name] MRN: [id number] Date of Birth: January 01, 1951  Transition of Care [facility]) CM/SW Contact:    Villa Herb, LCSWA Phone Number: 12/25/2022, 2:24 PM   Clinical Narrative: Pt to be admitted to [facility] TOC at [facility] will continue to follow pt on arrival at [facility]. TOC to follow.   Transition of Care Asessment: Insurance and Status: Insurance coverage has been reviewed Patient has primary care physician: Yes Home environment has been reviewed: From home Prior level of function:: independent Prior/Current Home Services: No current home services Social Determinants of Health Reivew: SDOH reviewed no interventions necessary Readmission risk has been reviewed: Yes Transition of care needs: no transition of care needs at this time

## 2022-12-26 ENCOUNTER — Inpatient Hospital Stay (HOSPITAL_COMMUNITY): Payer: Medicare HMO

## 2022-12-26 DIAGNOSIS — J9621 Acute and chronic respiratory failure with hypoxia: Secondary | ICD-10-CM | POA: Diagnosis not present

## 2022-12-26 DIAGNOSIS — J90 Pleural effusion, not elsewhere classified: Secondary | ICD-10-CM | POA: Diagnosis not present

## 2022-12-26 DIAGNOSIS — I4891 Unspecified atrial fibrillation: Secondary | ICD-10-CM | POA: Diagnosis not present

## 2022-12-26 LAB — CBC
HCT: 27.2 % — ABNORMAL LOW (ref 36.0–46.0)
Hemoglobin: 8.2 g/dL — ABNORMAL LOW (ref 12.0–15.0)
MCH: 30.1 pg (ref 26.0–34.0)
MCHC: 30.1 g/dL (ref 30.0–36.0)
MCV: 100 fL (ref 80.0–100.0)
Platelets: 349 10*3/uL (ref 150–400)
RBC: 2.72 MIL/uL — ABNORMAL LOW (ref 3.87–5.11)
RDW: 16.4 % — ABNORMAL HIGH (ref 11.5–15.5)
WBC: 9.8 10*3/uL (ref 4.0–10.5)
nRBC: 0.3 % — ABNORMAL HIGH (ref 0.0–0.2)

## 2022-12-26 LAB — COMPREHENSIVE METABOLIC PANEL
ALT: 12 U/L (ref 0–44)
AST: 17 U/L (ref 15–41)
Albumin: 2 g/dL — ABNORMAL LOW (ref 3.5–5.0)
Alkaline Phosphatase: 80 U/L (ref 38–126)
Anion gap: 8 (ref 5–15)
BUN: 9 mg/dL (ref 8–23)
CO2: 28 mmol/L (ref 22–32)
Calcium: 8.8 mg/dL — ABNORMAL LOW (ref 8.9–10.3)
Chloride: 105 mmol/L (ref 98–111)
Creatinine, Ser: 0.96 mg/dL (ref 0.44–1.00)
GFR, Estimated: >60 mL/min (ref >60)
Glucose, Bld: 121 mg/dL — ABNORMAL HIGH (ref 70–99)
Potassium: 4 mmol/L (ref 3.5–5.1)
Sodium: 141 mmol/L (ref 135–145)
Total Bilirubin: 0.4 mg/dL (ref 0.3–1.2)
Total Protein: 5.7 g/dL — ABNORMAL LOW (ref 6.5–8.1)

## 2022-12-26 LAB — GLUCOSE, CAPILLARY
Glucose-Capillary: 115 mg/dL — ABNORMAL HIGH (ref 70–99)
Glucose-Capillary: 127 mg/dL — ABNORMAL HIGH (ref 70–99)

## 2022-12-26 NOTE — Plan of Care (Signed)
Pt has rested quietly throughout the night with no distress noted. Alert and oriented. On 7LHFNC. ST/Afib on the monitor. Purwick intact to suction. No complaints voiced.     Problem: Fluid Volume: Goal: Hemodynamic stability will improve Outcome: Progressing   Problem: Clinical Measurements: Goal: Diagnostic test results will improve Outcome: Progressing Goal: Signs and symptoms of infection will decrease Outcome: Progressing   Problem: Respiratory: Goal: Ability to maintain adequate ventilation will improve Outcome: Progressing   Problem: Education: Goal: Knowledge of General Education information will improve Description: Including pain rating scale, medication(s)/side effects and non-pharmacologic comfort measures Outcome: Progressing   Problem: Health Behavior/Discharge Planning: Goal: Ability to manage health-related needs will improve Outcome: Progressing   Problem: Clinical Measurements: Goal: Ability to maintain clinical measurements within normal limits will improve Outcome: Progressing Goal: Will remain free from infection Outcome: Progressing Goal: Diagnostic test results will improve Outcome: Progressing Goal: Respiratory complications will improve Outcome: Progressing Goal: Cardiovascular complication will be avoided Outcome: Progressing   Problem: Activity: Goal: Risk for activity intolerance will decrease Outcome: Progressing   Problem: Nutrition: Goal: Adequate nutrition will be maintained Outcome: Progressing   Problem: Coping: Goal: Level of anxiety will decrease Outcome: Progressing   Problem: Elimination: Goal: Will not experience complications related to bowel motility Outcome: Progressing Goal: Will not experience complications related to urinary retention Outcome: Progressing   Problem: Pain Managment: Goal: General experience of comfort will improve Outcome: Progressing   Problem: Safety: Goal: Ability to remain free from injury  will improve Outcome: Progressing   Problem: Skin Integrity: Goal: Risk for impaired skin integrity will decrease Outcome: Progressing

## 2022-12-26 NOTE — Consult Note (Signed)
NAME:  Denise [name], MRN:  [id number], DOB:  12-07-1950, LOS: 2 ADMISSION DATE:  12/24/2022, CONSULTATION DATE:  12/26/2022 REFERRING MD:  Odis Luster MD, CHIEF COMPLAINT: Pleural effusion  History of Present Illness:   72 year old with history of diabetes, PE, aortic stenosis, hypertension, stage III lung cancer on chemoradiation presenting with sepsis, acute on chronic respiratory failure, hypoxia and left effusion.  She underwent ultrasound-guided thoracentesis on 10/7 with interventional radiology yielding 850 mL of pleural fluid  Pertinent  Medical History    has a past medical history of Anemia, Aortic stenosis, Arthritis, Coronary artery calcification seen on CT scan, Dyspnea, Essential hypertension, GERD (gastroesophageal reflux disease), History of lung cancer, Port-A-Cath in place (01/06/2019), and Type 2 diabetes mellitus (HCC).   Significant Hospital Events: Including procedures, antibiotic start and stop dates in addition to other pertinent events     Interim History / Subjective:    Objective   Blood pressure 91/62, pulse 96, temperature 98.3 F (36.8 C), resp. rate (!) 21, SpO2 92%.        Intake/Output Summary (Last 24 hours) at 12/26/2022 1110 Last data filed at 12/26/2022 0601 Gross per 24 hour  Intake 480 ml  Output --  Net 480 ml   There were no vitals filed for this visit.  Examination: Blood pressure 91/62, pulse 96, temperature 98.3 F (36.8 C), resp. rate (!) 21, SpO2 92%. Gen:      No acute distress HEENT:  EOMI, sclera anicteric Neck:     No masses; no thyromegaly Lungs:    Diminished breath sounds on the left CV:         Regular rate and rhythm; no murmurs Abd:      + bowel sounds; soft, non-tender; no palpable masses, no distension Ext:    No edema; adequate peripheral perfusion Skin:      Warm and dry; no rash Neuro: alert and oriented x 3 Psych: normal mood and affect   Lab/imaging reviewed Significant for hemoglobin 8.7, platelets  391 Pleural effusion showing LDH 278, 1332 nucleated cells, 60% neutrophils CTA on 10/6 reviewed with large left effusion, extensive groundglass consolidation and infiltrates.  Resolved Hospital Problem list     Assessment & Plan:  Acute on chronic respiratory failure with hypoxia Left exudative effusion secondary to pneumonia vs malignancy History of lung cancer, PE in the past.  CTA on admission this time with no residual clot S/p thoracentesis Follow pleural studies, cytology If cytology is positive for malignancy then she may need a Pleurx in the future. Continue azithromycin, ceftriaxone  Best Practice (right click and "Reselect all SmartList Selections" daily)   Per primary team  Critical care time: NA   Chilton Greathouse MD [facility] See Amion for pager  If no response to pager , please call [phone] until 7pm After 7:00 pm call Elink  [phone] 12/26/2022, 11:21 AM

## 2022-12-26 NOTE — Evaluation (Signed)
Physical Therapy Evaluation Patient Details Name: Denise [name] MRN: [id number] DOB: Dec 29, 1950 Today's Date: 12/26/2022  History of Present Illness  72 y.o. female admitted on 12/24/2022 with acute on chronic hypoxic respiratory failure with significant left-sided pleural effusion. 12/25/22 Left thoracentesis; sepsis with pna; new onset afib with RVR   PMH significant for diabetes mellitus, pulmonary embolism, aortic stenosis, hypertension, stage IV left lung adenocarcinoma-with metastatic to the right lung and liver  Clinical Impression   Pt admitted secondary to problem above with deficits below. PTA patient has a niece that lives with her and she works 4 am-12:30 pm and pt reports she is independent with her cane.  Pt currently requires CGA with RW (no cane available) with sats dropping to 85% on 6L after walking 6 ft. Pt recovered to 88% in ~60 seconds with cues for no talking and pursed lip breathing. Will follow to continue education related to respiratory status with mobility.  Anticipate patient will benefit from PT to address problems listed below.Will continue to follow acutely to maximize functional mobility independence and safety.           If plan is discharge home, recommend the following: A little help with walking and/or transfers;A little help with bathing/dressing/bathroom;Assistance with cooking/housework;Assist for transportation;Help with stairs or ramp for entrance   Can travel by private vehicle        Equipment Recommendations None recommended by PT  Recommendations for Other Services  OT consult    Functional Status Assessment Patient has had a recent decline in their functional status and demonstrates the ability to make significant improvements in function in a reasonable and predictable amount of time.     Precautions / Restrictions Precautions Precautions: Other (comment) Precaution Comments: easily becomes SOB      Mobility  Bed Mobility                General bed mobility comments: up in recliner (sleeps in recliner here and at home)    Transfers Overall transfer level: Modified independent Equipment used: Rolling walker (2 wheels), None               General transfer comment: pt mindful of lines as she demonstrates transfer to [facility] (which RN has ok'd her to do alone)    Ambulation/Gait Ambulation/Gait assistance: Contact guard assist Gait Distance (Feet): 6 Feet (seated rest, 4, toileted, 2) Assistive device: Rolling walker (2 wheels) Gait Pattern/deviations: Step-through pattern, Decreased stride length       General Gait Details: limited by dyspnea and decr sats to 85% on 6L  Stairs            Wheelchair Mobility     Tilt Bed    Modified Rankin (Stroke Patients Only)       Balance Overall balance assessment: Needs assistance   Sitting balance-Leahy Scale: Good       Standing balance-Leahy Scale: Poor                               Pertinent Vitals/Pain Pain Assessment Pain Assessment: No/denies pain    Home Living Family/patient expects to be discharged to:: Private residence Living Arrangements: Other relatives (niece works 4am-12:30 pm) Available Help at Discharge: Family;Available PRN/intermittently Type of Home: House Home Access: Ramped entrance       Home Layout: One level Home Equipment: Shower seat - built in;Grab bars - tub/shower;Lift chair;Cane - single point      Prior  Function Prior Level of Function : Independent/Modified Independent             Mobility Comments: uses cane due to bad rt knee ADLs Comments: only gets in shower when her breathing will allow     Extremity/Trunk Assessment   Upper Extremity Assessment Upper Extremity Assessment: Defer to OT evaluation    Lower Extremity Assessment Lower Extremity Assessment: Overall WFL for tasks assessed    Cervical / Trunk Assessment Cervical / Trunk Assessment: Other exceptions Cervical /  Trunk Exceptions: overweight  Communication   Communication Communication: No apparent difficulties  Cognition Arousal: Alert Behavior During Therapy: WFL for tasks assessed/performed Overall Cognitive Status: Within Functional Limits for tasks assessed                                          General Comments General comments (skin integrity, edema, etc.): Educated on not talking while mobilizing, pursed lip breathing, and use of IS to help manage her dyspnea.    Exercises     Assessment/Plan    PT Assessment Patient needs continued PT services  PT Problem List Decreased activity tolerance;Decreased balance;Decreased mobility;Cardiopulmonary status limiting activity       PT Treatment Interventions DME instruction;Gait training;Functional mobility training;Therapeutic activities;Therapeutic exercise;Balance training;Patient/family education    PT Goals (Current goals can be found in the Care Plan section)  Acute Rehab PT Goals Patient Stated Goal: go home when able to breathe better PT Goal Formulation: With patient Time For Goal Achievement: 01/09/23 Potential to Achieve Goals: Good    Frequency Min 1X/week     Co-evaluation               AM-PAC PT "6 Clicks" Mobility  Outcome Measure Help needed turning from your back to your side while in a flat bed without using bedrails?: A Little Help needed moving from lying on your back to sitting on the side of a flat bed without using bedrails?: A Little Help needed moving to and from a bed to a chair (including a wheelchair)?: None Help needed standing up from a chair using your arms (e.g., wheelchair or bedside chair)?: None Help needed to walk in hospital room?: A Little Help needed climbing 3-5 steps with a railing? : Total 6 Click Score: 18    End of Session Equipment Utilized During Treatment: Oxygen Activity Tolerance: Treatment limited secondary to medical complications (Comment) (dyspnea;  drop in sats) Patient left: in chair;with call bell/phone within reach Nurse Communication: Mobility status;Other (comment) (drop in sats and dyspnea) PT Visit Diagnosis: Difficulty in walking, not elsewhere classified (R26.2)    Time: 1540-1610 PT Time Calculation (min) (ACUTE ONLY): 30 min   Charges:   PT Evaluation $PT Eval Low Complexity: 1 Low PT Treatments $Gait Training: 8-22 mins PT General Charges $$ ACUTE PT VISIT: 1 Visit          Jerolyn Center, PT Acute Rehabilitation Services  Office [phone]   Zena Amos 12/26/2022, 4:56 PM

## 2022-12-26 NOTE — Discharge Instructions (Signed)
Information on my medicine - ELIQUIS (apixaban)  This medication education was reviewed with me or my healthcare representative as part of my discharge preparation.    Why was Eliquis prescribed for you? Eliquis was prescribed to treat blood clots that may have been found in the veins of your legs (deep vein thrombosis) or in your lungs (pulmonary embolism) and to reduce the risk of them occurring again.  What do You need to know about Eliquis ? Continue Eliquis 5 mg tablet taken TWICE daily.  Eliquis may be taken with or without food.   Try to take the dose about the same time in the morning and in the evening. If you have difficulty swallowing the tablet whole please discuss with your pharmacist how to take the medication safely.  Take Eliquis exactly as prescribed and DO NOT stop taking Eliquis without talking to the doctor who prescribed the medication.  Stopping may increase your risk of developing a new blood clot.  Refill your prescription before you run out.  After discharge, you should have regular check-up appointments with your healthcare provider that is prescribing your Eliquis.    What do you do if you miss a dose? If a dose of ELIQUIS is not taken at the scheduled time, take it as soon as possible on the same day and twice-daily administration should be resumed. The dose should not be doubled to make up for a missed dose.  Important Safety Information A possible side effect of Eliquis is bleeding. You should call your healthcare provider right away if you experience any of the following: Bleeding from an injury or your nose that does not stop. Unusual colored urine (red or dark brown) or unusual colored stools (red or black). Unusual bruising for unknown reasons. A serious fall or if you hit your head (even if there is no bleeding).  Some medicines may interact with Eliquis and might increase your risk of bleeding or clotting while on Eliquis. To help avoid this,  consult your healthcare provider or pharmacist prior to using any new prescription or non-prescription medications, including herbals, vitamins, non-steroidal anti-inflammatory drugs (NSAIDs) and supplements.  This website has more information on Eliquis (apixaban): http://www.eliquis.com/eliquis/home     Follow with Primary MD Kirstie Peri, MD in 7 days   Get CBC, CMP, anemia panel, TSH, 2 view Chest X ray -  checked next visit with your primary MD    Activity: As tolerated with Full fall precautions use walker/cane & assistance as needed  Disposition Home    Diet: Heart Healthy    Special Instructions: If you have smoked or chewed Tobacco  in the last 2 yrs please stop smoking, stop any regular Alcohol  and or any Recreational drug use.  On your next visit with your primary care physician please Get Medicines reviewed and adjusted.  Please request your Prim.MD to go over all Hospital Tests and Procedure/Radiological results at the follow up, please get all Hospital records sent to your Prim MD by signing hospital release before you go home.  If you experience worsening of your admission symptoms, develop shortness of breath, life threatening emergency, suicidal or homicidal thoughts you must seek medical attention immediately by calling 911 or calling your MD immediately  if symptoms less severe.  You Must read complete instructions/literature along with all the possible adverse reactions/side effects for all the Medicines you take and that have been prescribed to you. Take any new Medicines after you have completely understood and accpet all the

## 2022-12-26 NOTE — Progress Notes (Signed)
PROGRESS NOTE    Denise [name]  ZOX:[id number] DOB: [birth year] DOA: 12/24/2022 PCP: Mirna Mires, MD   Chief Complaint  Patient presents with   Shortness of Breath    Brief Narrative:   72 y.o. female with medical history significant for diabetes mellitus, pulmonary embolism, aortic stenosis, hypertension, stage IV left lung adenocarcinoma-with metastatic to the right lung and liver admitted on 12/24/2022 with acute on chronic hypoxic respiratory failure with significant left-sided pleural effusion.is admitted to [facility] for further management, status paracentesis 12/25/2022, workup significant for exudate, so she was sent to [facility] for evaluation by pulmonary.    Assessment & Plan:   Principal Problem:   Acute on chronic respiratory failure with hypoxia (HCC) Active Problems:   Pleural effusion, left   Multifocal pneumonia   Atrial fibrillation with RVR (HCC)   Severe sepsis (HCC)   Port-A-Cath in place   Pulmonary embolus (HCC)   Type 2 diabetes mellitus (HCC)   Essential hypertension    Acute on chronic respiratory failure with hypoxia  Left exudative effusion secondary to pneumonia versus malignancy Superimposed pneumonia -Patient with known history of lung cancer, on chemotherapy, followed by Dr. Ellin Saba, as well known history of PE in the past -She is with worsening oxygen requirement, on 2 L nasal cannula, this morning she is on 7 L. -Status post thoracentesis, patient reports respiratory status much improved. -Workup significant for exudate, follow on Gram stain-culture, as well follow-up on cytology. -Pulmonary input greatly appreciated, if cytology positive for malignancy, then she will need Pleurx catheter. -Continue with IV Rocephin and azithromycin -She was encouraged to use incentive spirometer and flutter valve presumed superimposed pneumonia    Severe sepsis, presented on admission, secondary to multifocal pneumonia  -Continue with IV Rocephin  and azithromycin    Atrial fibrillation with RVR (HCC) New onset atrial fibrillation in the setting of severe sepsis.  Continue with Coreg and Cardizem for heart rate control -Already on Eliquis for known history of PE. -Repeat limited echo on 12/25/2022 with EF of 65 to 70%,, moderate LVH,, and evidence of right ventricular pressure and volume overload, small pericardial effusion noted-----with possible significant aortic stenosis -Troponin 23> 24 -TSH WNL -Magnesium WNL   significant aortic stenosis--- please see limited echo dated 12/25/2022 -She is following with Dr. Diona Browner as an outpatient   DM2-A1c 6.4 reflecting good diabetic control PTA Use Novolog/Humalog Sliding scale insulin with Accu-Cheks/Fingersticks as ordered   Pulmonary embolus -history of unprovoked PE in the setting of underlying malignancy CTA chest today no PE.   -Continue lifelong anticoagulation   HTN/HFpEF-patient with history of chronic diastolic dysfunction CHF initiation echo from May 2024 -continue Coreg and Lasix   Chronic normocytic anemia--in setting of underlying malignancy and chemotherapy -Hgb currently above 8 -Monitor closely especially while on Eliquis and transfuse as clinically indicated   Metastatic adenocarcinoma of the lung to the liver and right lung:  -Please see imaging studies above  -patient usually sees Dr. Ellin Saba     DVT prophylaxis: Eliquis Code Status: Full Family Communication: none at bedside Disposition:   Status is: Inpatient    Consultants:  PCCM   Subjective:  Reports some dyspnea, cough, but reported had improved since admission.  Objective: Vitals:   12/26/22 0813 12/26/22 0842 12/26/22 0900 12/26/22 1100  BP: 92/78  91/62 (!) 91/59  Pulse: 94 90 96 75  Resp: (!) 21 (!) 23 (!) 21 19  Temp: 98.3 F (36.8 C)   98.3 F (36.8 C)  Radiology Studies: DG CHEST PORT 1 VIEW  Result Date: 12/26/2022 CLINICAL DATA:  Pleural effusion. EXAM: PORTABLE CHEST 1 VIEW COMPARISON:  Chest radiograph 12/25/2022 and CT 12/24/2022 FINDINGS: A right jugular Port-A-Cath remains in place, terminating over the mid SVC. The left heart border remains obscured. Aortic atherosclerosis is noted. There is a persistent moderate left pleural effusion with apparent mild differences compared to yesterday's radiograph potentially due to differences in patient positioning. Left suprahilar lung opacity has not significantly changed in the setting of previous radiation for lung cancer. Left lower lung airspace opacities are stable to mildly improved. Hazy and patchy airspace opacities throughout the right lung have not significantly changed, and there is likely a persistent small right pleural effusion. No pneumothorax is identified. IMPRESSION: 1. Persistent moderate left pleural effusion with stable to mildly improved left lung aeration. 2. Unchanged right lung infiltrates. Electronically Signed   By: Sebastian Ache M.D.   On: 12/26/2022 11:12   ECHOCARDIOGRAM LIMITED  Result Date: 12/25/2022    ECHOCARDIOGRAM LIMITED REPORT   Patient Name:    Denise [name] Date of Exam: 12/25/2022 Medical Rec #:  [id number]   Height:       62.0 in Accession #:    [id number]  Weight:       220.0 lb Date of Birth:  [birth year]    BSA:          1.991 m Patient Age:    72 years    BP:           106/62 mmHg Patient Gender: F           HR:           112 bpm. [facility] Procedure: Limited Echo Indications:    Atrial Fibrillation I48.91  History:        Patient has prior history of Echocardiogram examinations, most                 recent 07/19/2022. Risk Factors:Hypertension, Diabetes and Former                 Smoker.  Sonographer:    Celesta Gentile RCS Referring Phys: [phone] Heloise Beecham EMOKPAE IMPRESSIONS  1. Limited study.  2. Left ventricular ejection fraction, by estimation, is 65 to 70%. The left ventricle has normal function. The left ventricle has no regional wall motion abnormalities. There is moderate concentric left ventricular hypertrophy. There is the interventricular septum is flattened in systole and diastole, consistent with right ventricular pressure and volume overload.  3. Right ventricular systolic function is normal. The right ventricular size is mildly enlarged.  4. A small pericardial effusion is present. The pericardial effusion is circumferential.  5. The mitral valve is degenerative. Moderate mitral annular calcification.  6. The aortic valve is tricuspid. There is moderate calcification of the aortic valve. Aortic valve appears significantly stenotic - suggest complete interrogation for further assessment.  7. The inferior vena cava is normal in size with greater than 50% respiratory variability, suggesting right atrial pressure of 3 mmHg. Comparison(s): Prior images reviewed side by side. Difficult to compare given limited study - suggest complete study for further assessment. FINDINGS  Left Ventricle: Left ventricular ejection fraction, by estimation, is 65 to 70%. The left ventricle has normal function. The left ventricle has no regional wall  motion abnormalities. The left ventricular internal cavity size was normal in size. There is  moderate concentric left ventricular hypertrophy. The interventricular septum is flattened in systole and  Radiology Studies: DG CHEST PORT 1 VIEW  Result Date: 12/26/2022 CLINICAL DATA:  Pleural effusion. EXAM: PORTABLE CHEST 1 VIEW COMPARISON:  Chest radiograph 12/25/2022 and CT 12/24/2022 FINDINGS: A right jugular Port-A-Cath remains in place, terminating over the mid SVC. The left heart border remains obscured. Aortic atherosclerosis is noted. There is a persistent moderate left pleural effusion with apparent mild differences compared to yesterday's radiograph potentially due to differences in patient positioning. Left suprahilar lung opacity has not significantly changed in the setting of previous radiation for lung cancer. Left lower lung airspace opacities are stable to mildly improved. Hazy and patchy airspace opacities throughout the right lung have not significantly changed, and there is likely a persistent small right pleural effusion. No pneumothorax is identified. IMPRESSION: 1. Persistent moderate left pleural effusion with stable to mildly improved left lung aeration. 2. Unchanged right lung infiltrates. Electronically Signed   By: Sebastian Ache M.D.   On: 12/26/2022 11:12   ECHOCARDIOGRAM LIMITED  Result Date: 12/25/2022    ECHOCARDIOGRAM LIMITED REPORT   Patient Name:    Denise [name] Date of Exam: 12/25/2022 Medical Rec #:  [id number]   Height:       62.0 in Accession #:    [id number]  Weight:       220.0 lb Date of Birth:  [birth year]    BSA:          1.991 m Patient Age:    72 years    BP:           106/62 mmHg Patient Gender: F           HR:           112 bpm. [facility] Procedure: Limited Echo Indications:    Atrial Fibrillation I48.91  History:        Patient has prior history of Echocardiogram examinations, most                 recent 07/19/2022. Risk Factors:Hypertension, Diabetes and Former                 Smoker.  Sonographer:    Celesta Gentile RCS Referring Phys: [phone] Heloise Beecham EMOKPAE IMPRESSIONS  1. Limited study.  2. Left ventricular ejection fraction, by estimation, is 65 to 70%. The left ventricle has normal function. The left ventricle has no regional wall motion abnormalities. There is moderate concentric left ventricular hypertrophy. There is the interventricular septum is flattened in systole and diastole, consistent with right ventricular pressure and volume overload.  3. Right ventricular systolic function is normal. The right ventricular size is mildly enlarged.  4. A small pericardial effusion is present. The pericardial effusion is circumferential.  5. The mitral valve is degenerative. Moderate mitral annular calcification.  6. The aortic valve is tricuspid. There is moderate calcification of the aortic valve. Aortic valve appears significantly stenotic - suggest complete interrogation for further assessment.  7. The inferior vena cava is normal in size with greater than 50% respiratory variability, suggesting right atrial pressure of 3 mmHg. Comparison(s): Prior images reviewed side by side. Difficult to compare given limited study - suggest complete study for further assessment. FINDINGS  Left Ventricle: Left ventricular ejection fraction, by estimation, is 65 to 70%. The left ventricle has normal function. The left ventricle has no regional wall  motion abnormalities. The left ventricular internal cavity size was normal in size. There is  moderate concentric left ventricular hypertrophy. The interventricular septum is flattened in systole and  PROGRESS NOTE    Denise [name]  ZOX:[id number] DOB: [birth year] DOA: 12/24/2022 PCP: Mirna Mires, MD   Chief Complaint  Patient presents with   Shortness of Breath    Brief Narrative:   72 y.o. female with medical history significant for diabetes mellitus, pulmonary embolism, aortic stenosis, hypertension, stage IV left lung adenocarcinoma-with metastatic to the right lung and liver admitted on 12/24/2022 with acute on chronic hypoxic respiratory failure with significant left-sided pleural effusion.is admitted to [facility] for further management, status paracentesis 12/25/2022, workup significant for exudate, so she was sent to [facility] for evaluation by pulmonary.    Assessment & Plan:   Principal Problem:   Acute on chronic respiratory failure with hypoxia (HCC) Active Problems:   Pleural effusion, left   Multifocal pneumonia   Atrial fibrillation with RVR (HCC)   Severe sepsis (HCC)   Port-A-Cath in place   Pulmonary embolus (HCC)   Type 2 diabetes mellitus (HCC)   Essential hypertension    Acute on chronic respiratory failure with hypoxia  Left exudative effusion secondary to pneumonia versus malignancy Superimposed pneumonia -Patient with known history of lung cancer, on chemotherapy, followed by Dr. Ellin Saba, as well known history of PE in the past -She is with worsening oxygen requirement, on 2 L nasal cannula, this morning she is on 7 L. -Status post thoracentesis, patient reports respiratory status much improved. -Workup significant for exudate, follow on Gram stain-culture, as well follow-up on cytology. -Pulmonary input greatly appreciated, if cytology positive for malignancy, then she will need Pleurx catheter. -Continue with IV Rocephin and azithromycin -She was encouraged to use incentive spirometer and flutter valve presumed superimposed pneumonia    Severe sepsis, presented on admission, secondary to multifocal pneumonia  -Continue with IV Rocephin  and azithromycin    Atrial fibrillation with RVR (HCC) New onset atrial fibrillation in the setting of severe sepsis.  Continue with Coreg and Cardizem for heart rate control -Already on Eliquis for known history of PE. -Repeat limited echo on 12/25/2022 with EF of 65 to 70%,, moderate LVH,, and evidence of right ventricular pressure and volume overload, small pericardial effusion noted-----with possible significant aortic stenosis -Troponin 23> 24 -TSH WNL -Magnesium WNL   significant aortic stenosis--- please see limited echo dated 12/25/2022 -She is following with Dr. Diona Browner as an outpatient   DM2-A1c 6.4 reflecting good diabetic control PTA Use Novolog/Humalog Sliding scale insulin with Accu-Cheks/Fingersticks as ordered   Pulmonary embolus -history of unprovoked PE in the setting of underlying malignancy CTA chest today no PE.   -Continue lifelong anticoagulation   HTN/HFpEF-patient with history of chronic diastolic dysfunction CHF initiation echo from May 2024 -continue Coreg and Lasix   Chronic normocytic anemia--in setting of underlying malignancy and chemotherapy -Hgb currently above 8 -Monitor closely especially while on Eliquis and transfuse as clinically indicated   Metastatic adenocarcinoma of the lung to the liver and right lung:  -Please see imaging studies above  -patient usually sees Dr. Ellin Saba     DVT prophylaxis: Eliquis Code Status: Full Family Communication: none at bedside Disposition:   Status is: Inpatient    Consultants:  PCCM   Subjective:  Reports some dyspnea, cough, but reported had improved since admission.  Objective: Vitals:   12/26/22 0813 12/26/22 0842 12/26/22 0900 12/26/22 1100  BP: 92/78  91/62 (!) 91/59  Pulse: 94 90 96 75  Resp: (!) 21 (!) 23 (!) 21 19  Temp: 98.3 F (36.8 C)   98.3 F (36.8 C)  PROGRESS NOTE    Denise [name]  ZOX:[id number] DOB: [birth year] DOA: 12/24/2022 PCP: Mirna Mires, MD   Chief Complaint  Patient presents with   Shortness of Breath    Brief Narrative:   72 y.o. female with medical history significant for diabetes mellitus, pulmonary embolism, aortic stenosis, hypertension, stage IV left lung adenocarcinoma-with metastatic to the right lung and liver admitted on 12/24/2022 with acute on chronic hypoxic respiratory failure with significant left-sided pleural effusion.is admitted to [facility] for further management, status paracentesis 12/25/2022, workup significant for exudate, so she was sent to [facility] for evaluation by pulmonary.    Assessment & Plan:   Principal Problem:   Acute on chronic respiratory failure with hypoxia (HCC) Active Problems:   Pleural effusion, left   Multifocal pneumonia   Atrial fibrillation with RVR (HCC)   Severe sepsis (HCC)   Port-A-Cath in place   Pulmonary embolus (HCC)   Type 2 diabetes mellitus (HCC)   Essential hypertension    Acute on chronic respiratory failure with hypoxia  Left exudative effusion secondary to pneumonia versus malignancy Superimposed pneumonia -Patient with known history of lung cancer, on chemotherapy, followed by Dr. Ellin Saba, as well known history of PE in the past -She is with worsening oxygen requirement, on 2 L nasal cannula, this morning she is on 7 L. -Status post thoracentesis, patient reports respiratory status much improved. -Workup significant for exudate, follow on Gram stain-culture, as well follow-up on cytology. -Pulmonary input greatly appreciated, if cytology positive for malignancy, then she will need Pleurx catheter. -Continue with IV Rocephin and azithromycin -She was encouraged to use incentive spirometer and flutter valve presumed superimposed pneumonia    Severe sepsis, presented on admission, secondary to multifocal pneumonia  -Continue with IV Rocephin  and azithromycin    Atrial fibrillation with RVR (HCC) New onset atrial fibrillation in the setting of severe sepsis.  Continue with Coreg and Cardizem for heart rate control -Already on Eliquis for known history of PE. -Repeat limited echo on 12/25/2022 with EF of 65 to 70%,, moderate LVH,, and evidence of right ventricular pressure and volume overload, small pericardial effusion noted-----with possible significant aortic stenosis -Troponin 23> 24 -TSH WNL -Magnesium WNL   significant aortic stenosis--- please see limited echo dated 12/25/2022 -She is following with Dr. Diona Browner as an outpatient   DM2-A1c 6.4 reflecting good diabetic control PTA Use Novolog/Humalog Sliding scale insulin with Accu-Cheks/Fingersticks as ordered   Pulmonary embolus -history of unprovoked PE in the setting of underlying malignancy CTA chest today no PE.   -Continue lifelong anticoagulation   HTN/HFpEF-patient with history of chronic diastolic dysfunction CHF initiation echo from May 2024 -continue Coreg and Lasix   Chronic normocytic anemia--in setting of underlying malignancy and chemotherapy -Hgb currently above 8 -Monitor closely especially while on Eliquis and transfuse as clinically indicated   Metastatic adenocarcinoma of the lung to the liver and right lung:  -Please see imaging studies above  -patient usually sees Dr. Ellin Saba     DVT prophylaxis: Eliquis Code Status: Full Family Communication: none at bedside Disposition:   Status is: Inpatient    Consultants:  PCCM   Subjective:  Reports some dyspnea, cough, but reported had improved since admission.  Objective: Vitals:   12/26/22 0813 12/26/22 0842 12/26/22 0900 12/26/22 1100  BP: 92/78  91/62 (!) 91/59  Pulse: 94 90 96 75  Resp: (!) 21 (!) 23 (!) 21 19  Temp: 98.3 F (36.8 C)   98.3 F (36.8 C)  PROGRESS NOTE    Denise [name]  ZOX:[id number] DOB: [birth year] DOA: 12/24/2022 PCP: Mirna Mires, MD   Chief Complaint  Patient presents with   Shortness of Breath    Brief Narrative:   72 y.o. female with medical history significant for diabetes mellitus, pulmonary embolism, aortic stenosis, hypertension, stage IV left lung adenocarcinoma-with metastatic to the right lung and liver admitted on 12/24/2022 with acute on chronic hypoxic respiratory failure with significant left-sided pleural effusion.is admitted to [facility] for further management, status paracentesis 12/25/2022, workup significant for exudate, so she was sent to [facility] for evaluation by pulmonary.    Assessment & Plan:   Principal Problem:   Acute on chronic respiratory failure with hypoxia (HCC) Active Problems:   Pleural effusion, left   Multifocal pneumonia   Atrial fibrillation with RVR (HCC)   Severe sepsis (HCC)   Port-A-Cath in place   Pulmonary embolus (HCC)   Type 2 diabetes mellitus (HCC)   Essential hypertension    Acute on chronic respiratory failure with hypoxia  Left exudative effusion secondary to pneumonia versus malignancy Superimposed pneumonia -Patient with known history of lung cancer, on chemotherapy, followed by Dr. Ellin Saba, as well known history of PE in the past -She is with worsening oxygen requirement, on 2 L nasal cannula, this morning she is on 7 L. -Status post thoracentesis, patient reports respiratory status much improved. -Workup significant for exudate, follow on Gram stain-culture, as well follow-up on cytology. -Pulmonary input greatly appreciated, if cytology positive for malignancy, then she will need Pleurx catheter. -Continue with IV Rocephin and azithromycin -She was encouraged to use incentive spirometer and flutter valve presumed superimposed pneumonia    Severe sepsis, presented on admission, secondary to multifocal pneumonia  -Continue with IV Rocephin  and azithromycin    Atrial fibrillation with RVR (HCC) New onset atrial fibrillation in the setting of severe sepsis.  Continue with Coreg and Cardizem for heart rate control -Already on Eliquis for known history of PE. -Repeat limited echo on 12/25/2022 with EF of 65 to 70%,, moderate LVH,, and evidence of right ventricular pressure and volume overload, small pericardial effusion noted-----with possible significant aortic stenosis -Troponin 23> 24 -TSH WNL -Magnesium WNL   significant aortic stenosis--- please see limited echo dated 12/25/2022 -She is following with Dr. Diona Browner as an outpatient   DM2-A1c 6.4 reflecting good diabetic control PTA Use Novolog/Humalog Sliding scale insulin with Accu-Cheks/Fingersticks as ordered   Pulmonary embolus -history of unprovoked PE in the setting of underlying malignancy CTA chest today no PE.   -Continue lifelong anticoagulation   HTN/HFpEF-patient with history of chronic diastolic dysfunction CHF initiation echo from May 2024 -continue Coreg and Lasix   Chronic normocytic anemia--in setting of underlying malignancy and chemotherapy -Hgb currently above 8 -Monitor closely especially while on Eliquis and transfuse as clinically indicated   Metastatic adenocarcinoma of the lung to the liver and right lung:  -Please see imaging studies above  -patient usually sees Dr. Ellin Saba     DVT prophylaxis: Eliquis Code Status: Full Family Communication: none at bedside Disposition:   Status is: Inpatient    Consultants:  PCCM   Subjective:  Reports some dyspnea, cough, but reported had improved since admission.  Objective: Vitals:   12/26/22 0813 12/26/22 0842 12/26/22 0900 12/26/22 1100  BP: 92/78  91/62 (!) 91/59  Pulse: 94 90 96 75  Resp: (!) 21 (!) 23 (!) 21 19  Temp: 98.3 F (36.8 C)   98.3 F (36.8 C)  PROGRESS NOTE    Denise [name]  ZOX:[id number] DOB: [birth year] DOA: 12/24/2022 PCP: Mirna Mires, MD   Chief Complaint  Patient presents with   Shortness of Breath    Brief Narrative:   72 y.o. female with medical history significant for diabetes mellitus, pulmonary embolism, aortic stenosis, hypertension, stage IV left lung adenocarcinoma-with metastatic to the right lung and liver admitted on 12/24/2022 with acute on chronic hypoxic respiratory failure with significant left-sided pleural effusion.is admitted to [facility] for further management, status paracentesis 12/25/2022, workup significant for exudate, so she was sent to [facility] for evaluation by pulmonary.    Assessment & Plan:   Principal Problem:   Acute on chronic respiratory failure with hypoxia (HCC) Active Problems:   Pleural effusion, left   Multifocal pneumonia   Atrial fibrillation with RVR (HCC)   Severe sepsis (HCC)   Port-A-Cath in place   Pulmonary embolus (HCC)   Type 2 diabetes mellitus (HCC)   Essential hypertension    Acute on chronic respiratory failure with hypoxia  Left exudative effusion secondary to pneumonia versus malignancy Superimposed pneumonia -Patient with known history of lung cancer, on chemotherapy, followed by Dr. Ellin Saba, as well known history of PE in the past -She is with worsening oxygen requirement, on 2 L nasal cannula, this morning she is on 7 L. -Status post thoracentesis, patient reports respiratory status much improved. -Workup significant for exudate, follow on Gram stain-culture, as well follow-up on cytology. -Pulmonary input greatly appreciated, if cytology positive for malignancy, then she will need Pleurx catheter. -Continue with IV Rocephin and azithromycin -She was encouraged to use incentive spirometer and flutter valve presumed superimposed pneumonia    Severe sepsis, presented on admission, secondary to multifocal pneumonia  -Continue with IV Rocephin  and azithromycin    Atrial fibrillation with RVR (HCC) New onset atrial fibrillation in the setting of severe sepsis.  Continue with Coreg and Cardizem for heart rate control -Already on Eliquis for known history of PE. -Repeat limited echo on 12/25/2022 with EF of 65 to 70%,, moderate LVH,, and evidence of right ventricular pressure and volume overload, small pericardial effusion noted-----with possible significant aortic stenosis -Troponin 23> 24 -TSH WNL -Magnesium WNL   significant aortic stenosis--- please see limited echo dated 12/25/2022 -She is following with Dr. Diona Browner as an outpatient   DM2-A1c 6.4 reflecting good diabetic control PTA Use Novolog/Humalog Sliding scale insulin with Accu-Cheks/Fingersticks as ordered   Pulmonary embolus -history of unprovoked PE in the setting of underlying malignancy CTA chest today no PE.   -Continue lifelong anticoagulation   HTN/HFpEF-patient with history of chronic diastolic dysfunction CHF initiation echo from May 2024 -continue Coreg and Lasix   Chronic normocytic anemia--in setting of underlying malignancy and chemotherapy -Hgb currently above 8 -Monitor closely especially while on Eliquis and transfuse as clinically indicated   Metastatic adenocarcinoma of the lung to the liver and right lung:  -Please see imaging studies above  -patient usually sees Dr. Ellin Saba     DVT prophylaxis: Eliquis Code Status: Full Family Communication: none at bedside Disposition:   Status is: Inpatient    Consultants:  PCCM   Subjective:  Reports some dyspnea, cough, but reported had improved since admission.  Objective: Vitals:   12/26/22 0813 12/26/22 0842 12/26/22 0900 12/26/22 1100  BP: 92/78  91/62 (!) 91/59  Pulse: 94 90 96 75  Resp: (!) 21 (!) 23 (!) 21 19  Temp: 98.3 F (36.8 C)   98.3 F (36.8 C)

## 2022-12-27 ENCOUNTER — Inpatient Hospital Stay: Payer: Medicare HMO

## 2022-12-27 DIAGNOSIS — J189 Pneumonia, unspecified organism: Secondary | ICD-10-CM | POA: Diagnosis not present

## 2022-12-27 DIAGNOSIS — J9621 Acute and chronic respiratory failure with hypoxia: Secondary | ICD-10-CM | POA: Diagnosis not present

## 2022-12-27 DIAGNOSIS — J90 Pleural effusion, not elsewhere classified: Secondary | ICD-10-CM | POA: Diagnosis not present

## 2022-12-27 DIAGNOSIS — C3492 Malignant neoplasm of unspecified part of left bronchus or lung: Secondary | ICD-10-CM

## 2022-12-27 DIAGNOSIS — I4891 Unspecified atrial fibrillation: Secondary | ICD-10-CM | POA: Diagnosis not present

## 2022-12-27 LAB — GLUCOSE, CAPILLARY
Glucose-Capillary: 110 mg/dL — ABNORMAL HIGH (ref 70–99)
Glucose-Capillary: 107 mg/dL — ABNORMAL HIGH (ref 70–99)
Glucose-Capillary: 148 mg/dL — ABNORMAL HIGH (ref 70–99)

## 2022-12-27 LAB — CYTOLOGY - NON PAP

## 2022-12-27 MED ORDER — SODIUM CHLORIDE 0.9% FLUSH
10.0000 mL | INTRAVENOUS | Status: DC | PRN
  Administered 2022-12-29: 10 mL
  Administered 2022-12-30: 20 mL

## 2022-12-27 MED ORDER — CHLORHEXIDINE GLUCONATE CLOTH 2 % EX PADS
6.0000 | MEDICATED_PAD | Freq: Every day | CUTANEOUS | Status: DC
  Administered 2022-12-28 – 2023-01-02 (×6): 6 via TOPICAL

## 2022-12-27 MED ORDER — MIDODRINE HCL 5 MG PO TABS
10.0000 mg | ORAL_TABLET | Freq: Three times a day (TID) | ORAL | Status: DC
  Administered 2022-12-27 – 2023-01-02 (×18): 10 mg via ORAL
  Filled 2022-12-27 (×18): qty 2

## 2022-12-27 NOTE — Progress Notes (Signed)
NAME:  Denise [name], MRN:  [id number], DOB:  [birth year], LOS: 3 ADMISSION DATE:  12/24/2022, CONSULTATION DATE:  12/26/2022 REFERRING MD:  Odis Luster MD, CHIEF COMPLAINT: Pleural effusion  History of Present Illness:   72 year old with history of diabetes, PE, aortic stenosis, hypertension, stage III lung cancer on chemoradiation presenting with sepsis, acute on chronic respiratory failure, hypoxia and left effusion.  She underwent ultrasound-guided thoracentesis on 10/7 with interventional radiology yielding 850 mL of pleural fluid  Pertinent  Medical History    has a past medical history of Anemia, Aortic stenosis, Arthritis, Coronary artery calcification seen on CT scan, Dyspnea, Essential hypertension, GERD (gastroesophageal reflux disease), History of lung cancer, Port-A-Cath in place (01/06/2019), and Type 2 diabetes mellitus (HCC).   Significant Hospital Events: Including procedures, antibiotic start and stop dates in addition to other pertinent events     Interim History / Subjective:  Reports shortness of breath with any activity  Objective   Blood pressure (!) 98/57, pulse 76, temperature 98.2 F (36.8 C), temperature source Oral, resp. rate 20, SpO2 95%.        Intake/Output Summary (Last 24 hours) at 12/27/2022 1025 Last data filed at 12/27/2022 0600 Gross per 24 hour  Intake 480 ml  Output --  Net 480 ml   There were no vitals filed for this visit.  Examination: Obese female sitting in the chair and in no acute distress Reports getting short of breath with any activity No JVD or lymphadenopathy is appreciated Decreased breath sounds in the bases Heart sounds are distant Abdomen is obese soft nontender Extremities with edema  Resolved Hospital Problem list     Assessment & Plan:  Acute on chronic respiratory failure with hypoxia Left exudative effusion secondary to pneumonia vs malignancy History of lung cancer, PE in the past.  CTA on admission this time with  no residual clot S/p thoracentesis at [facility] Follow-up pleural studies and cytology May need Pleurx in the future Continue antimicrobial therapy Best Practice (right click and "Reselect all SmartList Selections" daily)   Per primary team  Critical care time: NA   Steve Jaielle Dlouhy ACNP Acute Care Nurse Practitioner [facility]/Critical Care Please consult Amion 12/27/2022, 10:25 AM

## 2022-12-27 NOTE — Progress Notes (Signed)
12/25/2022    ECHOCARDIOGRAM LIMITED REPORT   Patient Name:   Denise [name] Date of Exam: 12/25/2022 Medical Rec #:  [id number]   Height:       62.0 in Accession #:    [id number]  Weight:       220.0 lb Date of Birth:  September 10, 1950    BSA:          1.991 m Patient Age:    72 years    BP:           106/62 mmHg Patient Gender: F           HR:           112 bpm. [facility] Procedure: Limited Echo Indications:    Atrial Fibrillation I48.91  History:        Patient has prior history of Echocardiogram examinations, most                 recent 07/19/2022. Risk Factors:Hypertension, Diabetes and Former                 Smoker.  Sonographer:    Celesta Gentile RCS Referring Phys: [phone] Heloise Beecham EMOKPAE IMPRESSIONS  1. Limited study.  2. Left ventricular ejection fraction, by estimation, is 65 to 70%. The left ventricle has normal function. The left ventricle has no regional wall motion abnormalities. There is moderate concentric left ventricular hypertrophy. There is the interventricular septum is flattened in systole and diastole, consistent with right ventricular pressure and volume overload.  3. Right ventricular systolic function is normal. The right ventricular size is mildly enlarged.  4. A small pericardial effusion is present. The pericardial effusion is circumferential.  5. The mitral valve is degenerative. Moderate mitral annular calcification.  6. The aortic valve is tricuspid. There is moderate calcification of the aortic valve. Aortic valve appears significantly stenotic - suggest complete interrogation for further assessment.  7. The inferior vena cava is normal in size with greater than 50% respiratory variability, suggesting right atrial pressure of 3 mmHg. Comparison(s): Prior images reviewed side by side. Difficult to compare given limited  study - suggest complete study for further assessment. FINDINGS  Left Ventricle: Left ventricular ejection fraction, by estimation, is 65 to 70%. The left ventricle has normal function. The left ventricle has no regional wall motion abnormalities. The left ventricular internal cavity size was normal in size. There is  moderate concentric left ventricular hypertrophy. The interventricular septum is flattened in systole and diastole, consistent with right ventricular pressure and volume overload. Right Ventricle: The right ventricular size is mildly enlarged. No increase in right ventricular wall thickness. Right ventricular systolic function is normal. Pericardium: A small pericardial effusion is present. The pericardial effusion is circumferential. Mitral Valve: The mitral valve is degenerative in appearance. There is mild thickening of the mitral valve leaflet(s). There is mild calcification of the mitral valve leaflet(s). Moderate mitral annular calcification. Aortic Valve: The aortic valve is tricuspid. There is moderate calcification of the aortic valve. Aorta: The aortic root is normal in size and structure. Venous: The inferior vena cava is normal in size with greater than 50% respiratory variability, suggesting right atrial pressure of 3 mmHg. LEFT VENTRICLE PLAX 2D LVIDd:         3.40 cm LVIDs:         2.40 cm LV PW:         1.40 cm LV IVS:        1.40 cm LVOT  PROGRESS NOTE        PATIENT DETAILS Name: Denise [name] Age: 72 y.o. Sex: female Date of Birth: Aug 18, 1950 Admit Date: 12/24/2022 Admitting Physician Ejiroghene Wendall Stade, MD BJY:NWGN, Earvin Hansen, MD  Brief Summary: Patient is a 72 y.o.  female with history of metastatic/stage IV adenocarcinoma of the lung, chronic toxic respiratory failure on 2 L of oxygen, PE, DM-2, HTN, aortic stenosis-who was admitted to [facility] on 10/6 with shortness of breath-for her to have large left-sided pleural effusion-underwent thoracocentesis-workup was suggestive of an exudate-and transferred to [facility] for pulmonary evaluation.  Significant events: 10/6>> admit to [facility]  Significant studies: 10/6>> CTA chest: No PE, extensive symmetric groundglass/consolidative changes-large left pleural effusion.  Stay able enhancing lesions in the liver bilaterally. 10/7>> echo: EF 65-70%  Significant microbiology data: 10/6>> COVID/influenza/RSV PCR: Negative 10/6>> blood culture: No growth 10/7>> pleural fluid culture: No growth  Procedures: 10/7>> ultrasound-guided thoracocentesis (WBC 1332-60% neutrophils, LDH 278)  Consults: PCCM  Subjective: Breathing is better-on around 4 L of oxygen this morning.  Objective: Vitals: Blood pressure (!) 98/57, pulse 76, temperature 98.2 F (36.8 C), temperature source Oral, resp. rate 20, SpO2 95%.   Exam: Gen Exam:Alert awake-not in any distress HEENT:atraumatic, normocephalic Chest: B/L clear to auscultation anteriorly CVS:S1S2 regular Abdomen:soft non tender, non distended Extremities:no edema Neurology: Non focal Skin: no rash  Pertinent Labs/Radiology:    Latest Ref Rng & Units 12/26/2022    8:55 PM 12/25/2022    3:14 AM 12/24/2022    4:57 PM  CBC  WBC 4.0 - 10.5 K/uL 9.8  9.6  10.2   Hemoglobin 12.0 - 15.0 g/dL 8.2  8.7  9.5   Hematocrit 36.0 - 46.0 % 27.2  29.0  31.9   Platelets 150 - 400 K/uL 349  391  463     Lab  Results  Component Value Date   NA 141 12/26/2022   K 4.0 12/26/2022   CL 105 12/26/2022   CO2 28 12/26/2022     Assessment/Plan: Severe sepsis secondary to multifocal pneumonia Acute on chronic hypoxic respiratory failure secondary to PNA-pleural effusion Gradually improving-oxygen requirement slowly coming down-Down to 4 L of oxygen this morning Empiric Rocephin/Zithromax Cultures negative so far.  Exudative pleural effusion Unclear whether this is parapneumonic or related to malignancy Pleural fluid cultures negative so far Awaiting cytology Remains on antibiotics PCCM following-potentially may require Pleurx catheter at some point.  PAF with RVR Sign rhythm Continue Coreg Stop Cardizem as BP soft. Eliquis  HTN BP on the soft side Continue Coreg for rate control Stopping Cardizem Midodrine changed to 3 times daily  HFpEF Euvolemic BPs soft-midodrine dosage escalated 10/9 Beta-blocker/Lasix  Aortic stenosis chronic issue. Stable for outpatient follow-up  Normocytic anemia Likely due to underlying malignancy No evidence of blood loss Follow CBC  Metastatic adenocarcinoma of the lung Follow-up with oncology in the outpatient setting  History of unprovoked pulmonary embolism Per prior notes-needs indefinite anticoagulation-remains on Eliquis  Morbid Obesity: Estimated body mass index is 40.24 kg/m as calculated from the following:   Height as of 10/30/22: 5\' 2"  (1.575 m).   Weight as of 12/20/22: 99.8 kg.   Code status:   Code Status: Full Code   DVT Prophylaxis: SCDs Start: 12/24/22 2326 apixaban (ELIQUIS) tablet 5 mg    Family Communication: None at bedside   Disposition Plan: Status is:  PROGRESS NOTE        PATIENT DETAILS Name: Denise [name] Age: 72 y.o. Sex: female Date of Birth: Aug 18, 1950 Admit Date: 12/24/2022 Admitting Physician Ejiroghene Wendall Stade, MD BJY:NWGN, Earvin Hansen, MD  Brief Summary: Patient is a 72 y.o.  female with history of metastatic/stage IV adenocarcinoma of the lung, chronic toxic respiratory failure on 2 L of oxygen, PE, DM-2, HTN, aortic stenosis-who was admitted to [facility] on 10/6 with shortness of breath-for her to have large left-sided pleural effusion-underwent thoracocentesis-workup was suggestive of an exudate-and transferred to [facility] for pulmonary evaluation.  Significant events: 10/6>> admit to [facility]  Significant studies: 10/6>> CTA chest: No PE, extensive symmetric groundglass/consolidative changes-large left pleural effusion.  Stay able enhancing lesions in the liver bilaterally. 10/7>> echo: EF 65-70%  Significant microbiology data: 10/6>> COVID/influenza/RSV PCR: Negative 10/6>> blood culture: No growth 10/7>> pleural fluid culture: No growth  Procedures: 10/7>> ultrasound-guided thoracocentesis (WBC 1332-60% neutrophils, LDH 278)  Consults: PCCM  Subjective: Breathing is better-on around 4 L of oxygen this morning.  Objective: Vitals: Blood pressure (!) 98/57, pulse 76, temperature 98.2 F (36.8 C), temperature source Oral, resp. rate 20, SpO2 95%.   Exam: Gen Exam:Alert awake-not in any distress HEENT:atraumatic, normocephalic Chest: B/L clear to auscultation anteriorly CVS:S1S2 regular Abdomen:soft non tender, non distended Extremities:no edema Neurology: Non focal Skin: no rash  Pertinent Labs/Radiology:    Latest Ref Rng & Units 12/26/2022    8:55 PM 12/25/2022    3:14 AM 12/24/2022    4:57 PM  CBC  WBC 4.0 - 10.5 K/uL 9.8  9.6  10.2   Hemoglobin 12.0 - 15.0 g/dL 8.2  8.7  9.5   Hematocrit 36.0 - 46.0 % 27.2  29.0  31.9   Platelets 150 - 400 K/uL 349  391  463     Lab  Results  Component Value Date   NA 141 12/26/2022   K 4.0 12/26/2022   CL 105 12/26/2022   CO2 28 12/26/2022     Assessment/Plan: Severe sepsis secondary to multifocal pneumonia Acute on chronic hypoxic respiratory failure secondary to PNA-pleural effusion Gradually improving-oxygen requirement slowly coming down-Down to 4 L of oxygen this morning Empiric Rocephin/Zithromax Cultures negative so far.  Exudative pleural effusion Unclear whether this is parapneumonic or related to malignancy Pleural fluid cultures negative so far Awaiting cytology Remains on antibiotics PCCM following-potentially may require Pleurx catheter at some point.  PAF with RVR Sign rhythm Continue Coreg Stop Cardizem as BP soft. Eliquis  HTN BP on the soft side Continue Coreg for rate control Stopping Cardizem Midodrine changed to 3 times daily  HFpEF Euvolemic BPs soft-midodrine dosage escalated 10/9 Beta-blocker/Lasix  Aortic stenosis chronic issue. Stable for outpatient follow-up  Normocytic anemia Likely due to underlying malignancy No evidence of blood loss Follow CBC  Metastatic adenocarcinoma of the lung Follow-up with oncology in the outpatient setting  History of unprovoked pulmonary embolism Per prior notes-needs indefinite anticoagulation-remains on Eliquis  Morbid Obesity: Estimated body mass index is 40.24 kg/m as calculated from the following:   Height as of 10/30/22: 5\' 2"  (1.575 m).   Weight as of 12/20/22: 99.8 kg.   Code status:   Code Status: Full Code   DVT Prophylaxis: SCDs Start: 12/24/22 2326 apixaban (ELIQUIS) tablet 5 mg    Family Communication: None at bedside   Disposition Plan: Status is:  12/25/2022    ECHOCARDIOGRAM LIMITED REPORT   Patient Name:   Denise [name] Date of Exam: 12/25/2022 Medical Rec #:  [id number]   Height:       62.0 in Accession #:    [id number]  Weight:       220.0 lb Date of Birth:  September 10, 1950    BSA:          1.991 m Patient Age:    72 years    BP:           106/62 mmHg Patient Gender: F           HR:           112 bpm. [facility] Procedure: Limited Echo Indications:    Atrial Fibrillation I48.91  History:        Patient has prior history of Echocardiogram examinations, most                 recent 07/19/2022. Risk Factors:Hypertension, Diabetes and Former                 Smoker.  Sonographer:    Celesta Gentile RCS Referring Phys: [phone] Heloise Beecham EMOKPAE IMPRESSIONS  1. Limited study.  2. Left ventricular ejection fraction, by estimation, is 65 to 70%. The left ventricle has normal function. The left ventricle has no regional wall motion abnormalities. There is moderate concentric left ventricular hypertrophy. There is the interventricular septum is flattened in systole and diastole, consistent with right ventricular pressure and volume overload.  3. Right ventricular systolic function is normal. The right ventricular size is mildly enlarged.  4. A small pericardial effusion is present. The pericardial effusion is circumferential.  5. The mitral valve is degenerative. Moderate mitral annular calcification.  6. The aortic valve is tricuspid. There is moderate calcification of the aortic valve. Aortic valve appears significantly stenotic - suggest complete interrogation for further assessment.  7. The inferior vena cava is normal in size with greater than 50% respiratory variability, suggesting right atrial pressure of 3 mmHg. Comparison(s): Prior images reviewed side by side. Difficult to compare given limited  study - suggest complete study for further assessment. FINDINGS  Left Ventricle: Left ventricular ejection fraction, by estimation, is 65 to 70%. The left ventricle has normal function. The left ventricle has no regional wall motion abnormalities. The left ventricular internal cavity size was normal in size. There is  moderate concentric left ventricular hypertrophy. The interventricular septum is flattened in systole and diastole, consistent with right ventricular pressure and volume overload. Right Ventricle: The right ventricular size is mildly enlarged. No increase in right ventricular wall thickness. Right ventricular systolic function is normal. Pericardium: A small pericardial effusion is present. The pericardial effusion is circumferential. Mitral Valve: The mitral valve is degenerative in appearance. There is mild thickening of the mitral valve leaflet(s). There is mild calcification of the mitral valve leaflet(s). Moderate mitral annular calcification. Aortic Valve: The aortic valve is tricuspid. There is moderate calcification of the aortic valve. Aorta: The aortic root is normal in size and structure. Venous: The inferior vena cava is normal in size with greater than 50% respiratory variability, suggesting right atrial pressure of 3 mmHg. LEFT VENTRICLE PLAX 2D LVIDd:         3.40 cm LVIDs:         2.40 cm LV PW:         1.40 cm LV IVS:        1.40 cm LVOT  PROGRESS NOTE        PATIENT DETAILS Name: Denise [name] Age: 72 y.o. Sex: female Date of Birth: Aug 18, 1950 Admit Date: 12/24/2022 Admitting Physician Ejiroghene Wendall Stade, MD BJY:NWGN, Earvin Hansen, MD  Brief Summary: Patient is a 72 y.o.  female with history of metastatic/stage IV adenocarcinoma of the lung, chronic toxic respiratory failure on 2 L of oxygen, PE, DM-2, HTN, aortic stenosis-who was admitted to [facility] on 10/6 with shortness of breath-for her to have large left-sided pleural effusion-underwent thoracocentesis-workup was suggestive of an exudate-and transferred to [facility] for pulmonary evaluation.  Significant events: 10/6>> admit to [facility]  Significant studies: 10/6>> CTA chest: No PE, extensive symmetric groundglass/consolidative changes-large left pleural effusion.  Stay able enhancing lesions in the liver bilaterally. 10/7>> echo: EF 65-70%  Significant microbiology data: 10/6>> COVID/influenza/RSV PCR: Negative 10/6>> blood culture: No growth 10/7>> pleural fluid culture: No growth  Procedures: 10/7>> ultrasound-guided thoracocentesis (WBC 1332-60% neutrophils, LDH 278)  Consults: PCCM  Subjective: Breathing is better-on around 4 L of oxygen this morning.  Objective: Vitals: Blood pressure (!) 98/57, pulse 76, temperature 98.2 F (36.8 C), temperature source Oral, resp. rate 20, SpO2 95%.   Exam: Gen Exam:Alert awake-not in any distress HEENT:atraumatic, normocephalic Chest: B/L clear to auscultation anteriorly CVS:S1S2 regular Abdomen:soft non tender, non distended Extremities:no edema Neurology: Non focal Skin: no rash  Pertinent Labs/Radiology:    Latest Ref Rng & Units 12/26/2022    8:55 PM 12/25/2022    3:14 AM 12/24/2022    4:57 PM  CBC  WBC 4.0 - 10.5 K/uL 9.8  9.6  10.2   Hemoglobin 12.0 - 15.0 g/dL 8.2  8.7  9.5   Hematocrit 36.0 - 46.0 % 27.2  29.0  31.9   Platelets 150 - 400 K/uL 349  391  463     Lab  Results  Component Value Date   NA 141 12/26/2022   K 4.0 12/26/2022   CL 105 12/26/2022   CO2 28 12/26/2022     Assessment/Plan: Severe sepsis secondary to multifocal pneumonia Acute on chronic hypoxic respiratory failure secondary to PNA-pleural effusion Gradually improving-oxygen requirement slowly coming down-Down to 4 L of oxygen this morning Empiric Rocephin/Zithromax Cultures negative so far.  Exudative pleural effusion Unclear whether this is parapneumonic or related to malignancy Pleural fluid cultures negative so far Awaiting cytology Remains on antibiotics PCCM following-potentially may require Pleurx catheter at some point.  PAF with RVR Sign rhythm Continue Coreg Stop Cardizem as BP soft. Eliquis  HTN BP on the soft side Continue Coreg for rate control Stopping Cardizem Midodrine changed to 3 times daily  HFpEF Euvolemic BPs soft-midodrine dosage escalated 10/9 Beta-blocker/Lasix  Aortic stenosis chronic issue. Stable for outpatient follow-up  Normocytic anemia Likely due to underlying malignancy No evidence of blood loss Follow CBC  Metastatic adenocarcinoma of the lung Follow-up with oncology in the outpatient setting  History of unprovoked pulmonary embolism Per prior notes-needs indefinite anticoagulation-remains on Eliquis  Morbid Obesity: Estimated body mass index is 40.24 kg/m as calculated from the following:   Height as of 10/30/22: 5\' 2"  (1.575 m).   Weight as of 12/20/22: 99.8 kg.   Code status:   Code Status: Full Code   DVT Prophylaxis: SCDs Start: 12/24/22 2326 apixaban (ELIQUIS) tablet 5 mg    Family Communication: None at bedside   Disposition Plan: Status is:  12/25/2022    ECHOCARDIOGRAM LIMITED REPORT   Patient Name:   Denise [name] Date of Exam: 12/25/2022 Medical Rec #:  [id number]   Height:       62.0 in Accession #:    [id number]  Weight:       220.0 lb Date of Birth:  September 10, 1950    BSA:          1.991 m Patient Age:    72 years    BP:           106/62 mmHg Patient Gender: F           HR:           112 bpm. [facility] Procedure: Limited Echo Indications:    Atrial Fibrillation I48.91  History:        Patient has prior history of Echocardiogram examinations, most                 recent 07/19/2022. Risk Factors:Hypertension, Diabetes and Former                 Smoker.  Sonographer:    Celesta Gentile RCS Referring Phys: [phone] Heloise Beecham EMOKPAE IMPRESSIONS  1. Limited study.  2. Left ventricular ejection fraction, by estimation, is 65 to 70%. The left ventricle has normal function. The left ventricle has no regional wall motion abnormalities. There is moderate concentric left ventricular hypertrophy. There is the interventricular septum is flattened in systole and diastole, consistent with right ventricular pressure and volume overload.  3. Right ventricular systolic function is normal. The right ventricular size is mildly enlarged.  4. A small pericardial effusion is present. The pericardial effusion is circumferential.  5. The mitral valve is degenerative. Moderate mitral annular calcification.  6. The aortic valve is tricuspid. There is moderate calcification of the aortic valve. Aortic valve appears significantly stenotic - suggest complete interrogation for further assessment.  7. The inferior vena cava is normal in size with greater than 50% respiratory variability, suggesting right atrial pressure of 3 mmHg. Comparison(s): Prior images reviewed side by side. Difficult to compare given limited  study - suggest complete study for further assessment. FINDINGS  Left Ventricle: Left ventricular ejection fraction, by estimation, is 65 to 70%. The left ventricle has normal function. The left ventricle has no regional wall motion abnormalities. The left ventricular internal cavity size was normal in size. There is  moderate concentric left ventricular hypertrophy. The interventricular septum is flattened in systole and diastole, consistent with right ventricular pressure and volume overload. Right Ventricle: The right ventricular size is mildly enlarged. No increase in right ventricular wall thickness. Right ventricular systolic function is normal. Pericardium: A small pericardial effusion is present. The pericardial effusion is circumferential. Mitral Valve: The mitral valve is degenerative in appearance. There is mild thickening of the mitral valve leaflet(s). There is mild calcification of the mitral valve leaflet(s). Moderate mitral annular calcification. Aortic Valve: The aortic valve is tricuspid. There is moderate calcification of the aortic valve. Aorta: The aortic root is normal in size and structure. Venous: The inferior vena cava is normal in size with greater than 50% respiratory variability, suggesting right atrial pressure of 3 mmHg. LEFT VENTRICLE PLAX 2D LVIDd:         3.40 cm LVIDs:         2.40 cm LV PW:         1.40 cm LV IVS:        1.40 cm LVOT

## 2022-12-27 NOTE — Evaluation (Signed)
Occupational Therapy Evaluation/Discharge Patient Details Name: Denise [name] MRN: [id number] DOB: 27-Dec-1950 Today's Date: 12/27/2022   History of Present Illness 72 y.o. female admitted on 12/24/2022 with acute on chronic hypoxic respiratory failure with significant left-sided pleural effusion. 12/25/22 Left thoracentesis; sepsis with pna; new onset afib with RVR   PMH significant for diabetes mellitus, pulmonary embolism, aortic stenosis, hypertension, stage IV left lung adenocarcinoma-with metastatic to the right lung and liver   Clinical Impression   Patient evaluated by Occupational Therapy with no further acute OT needs identified. All education has been completed and the patient has no further questions. At baseline, pt is mod I with BADL tasks and functional mobility. Education provided regarding energy conservation and breathing techniques. Pt was able to verbalize techniques she is already utilizing to conserve energy and also verbalize techniques she could implement. Handout provided for reference. Pt mentioned she sleeps in a recliner because she can not breathe while laying flat and inquired about getting a hospital bed.  See below for any follow-up Occupational Therapy or equipment needs. OT is signing off. Thank you for this referral.        If plan is discharge home, recommend the following: Assist for transportation;Assistance with cooking/housework    Functional Status Assessment  Patient has had a recent decline in their functional status and demonstrates the ability to make significant improvements in function in a reasonable and predictable amount of time.  Equipment Recommendations  Other (comment) (hospital bed)       Precautions / Restrictions Precautions Precautions: Other (comment) Precaution Comments: watch stats Restrictions Weight Bearing Restrictions: No      Mobility Bed Mobility Overal bed mobility:  (Up in recliner.)     Transfers Overall transfer  level:  (defer to PT note/eval)                   ADL either performed or assessed with clinical judgement   ADL       General ADL Comments: Pt is able to complete BADL tasks with increased time and rest breaks d/t SOB and fatigue. Pt education provided on energy conservation techniques and Pursed lip breathing. handout provided for reference.     Vision Baseline Vision/History: 1 Wears glasses Ability to See in Adequate Light: 0 Adequate Patient Visual Report: No change from baseline Vision Assessment?: Wears glasses for reading     Perception Perception: Not tested       Praxis Praxis: Not tested       Pertinent Vitals/Pain Pain Assessment Pain Assessment: No/denies pain     Extremity/Trunk Assessment Upper Extremity Assessment Upper Extremity Assessment: Overall WFL for tasks assessed      Cervical / Trunk Assessment Cervical / Trunk Assessment: Other exceptions Cervical / Trunk Exceptions: body habitus   Communication Communication Communication: No apparent difficulties   Cognition Arousal: Alert Behavior During Therapy: WFL for tasks assessed/performed Overall Cognitive Status: Within Functional Limits for tasks assessed            General Comments  O2 on during session. Pt sitting to eat lunch. SpO2 decreased when speaking to high 80's. With increased time and breathing technique, SpO2 increased to 90's.            Home Living Family/patient expects to be discharged to:: Private residence Living Arrangements: Other relatives (neice) Available Help at Discharge: Family;Available PRN/intermittently (Niece works 5 days/wk 4AM-12:30PM) Type of Home: House Home Access: Ramped entrance     Home Layout: One level  Bathroom Shower/Tub: Producer, television/film/video: Pharmacist, community: Yes   Home Equipment: Shower seat - built in;Grab bars - tub/shower;Lift chair;Cane - single point          Prior  Functioning/Environment Prior Level of Function : Independent/Modified Independent    Mobility Comments: uses cane due to bad R knee          OT Problem List: Decreased activity tolerance         OT Goals(Current goals can be found in the care plan section) Acute Rehab OT Goals Patient Stated Goal: none stated  OT Frequency:  1X visit       AM-PAC OT "6 Clicks" Daily Activity     Outcome Measure Help from another person eating meals?: None Help from another person taking care of personal grooming?: None Help from another person toileting, which includes using toliet, bedpan, or urinal?: None Help from another person bathing (including washing, rinsing, drying)?: None Help from another person to put on and taking off regular upper body clothing?: None Help from another person to put on and taking off regular lower body clothing?: None 6 Click Score: 24   End of Session Equipment Utilized During Treatment: Oxygen  Activity Tolerance: Patient tolerated treatment well Patient left: in chair;with call bell/phone within reach;with family/visitor present  OT Visit Diagnosis: Other (comment) (decreased activity tolerance and endurance)                Time: 5621-3086 OT Time Calculation (min): 22 min Charges:  OT General Charges $OT Visit: 1 Visit OT Evaluation $OT Eval Low Complexity: 1 Low  [employer], OTR/L,CBIS  Supplemental OT - [facility] and [employer] Secure Chat Preferred    Jereme Loren, Charisse March 12/27/2022, 1:54 PM

## 2022-12-27 NOTE — Care Management Important Message (Signed)
Important Message  Patient Details  Name: Denise [name] MRN: [id number] Date of Birth: [birth year]   Important Message Given:  Yes - Medicare IM     Dorena Bodo 12/27/2022, 2:47 PM

## 2022-12-27 NOTE — Plan of Care (Signed)
  Problem: Respiratory: Goal: Ability to maintain adequate ventilation will improve Outcome: Progressing   Problem: Clinical Measurements: Goal: Diagnostic test results will improve Outcome: Progressing Goal: Respiratory complications will improve Outcome: Progressing   Problem: Pain Managment: Goal: General experience of comfort will improve Outcome: Progressing

## 2022-12-27 NOTE — Plan of Care (Signed)
Pt has rested quietly throughout the night with no distress noted. Alert and oriented. On O2 5LHFNC. SR on the monitor. Up to [facility] to void. Pt sleeping in recliner. No complaints voiced.     Problem: Fluid Volume: Goal: Hemodynamic stability will improve Outcome: Progressing   Problem: Clinical Measurements: Goal: Diagnostic test results will improve Outcome: Progressing Goal: Signs and symptoms of infection will decrease Outcome: Progressing   Problem: Respiratory: Goal: Ability to maintain adequate ventilation will improve Outcome: Progressing   Problem: Education: Goal: Knowledge of General Education information will improve Description: Including pain rating scale, medication(s)/side effects and non-pharmacologic comfort measures Outcome: Progressing   Problem: Health Behavior/Discharge Planning: Goal: Ability to manage health-related needs will improve Outcome: Progressing   Problem: Clinical Measurements: Goal: Ability to maintain clinical measurements within normal limits will improve Outcome: Progressing Goal: Will remain free from infection Outcome: Progressing Goal: Diagnostic test results will improve Outcome: Progressing Goal: Respiratory complications will improve Outcome: Progressing Goal: Cardiovascular complication will be avoided Outcome: Progressing   Problem: Activity: Goal: Risk for activity intolerance will decrease Outcome: Progressing   Problem: Nutrition: Goal: Adequate nutrition will be maintained Outcome: Progressing   Problem: Coping: Goal: Level of anxiety will decrease Outcome: Progressing   Problem: Elimination: Goal: Will not experience complications related to bowel motility Outcome: Progressing Goal: Will not experience complications related to urinary retention Outcome: Progressing   Problem: Pain Managment: Goal: General experience of comfort will improve Outcome: Progressing   Problem: Safety: Goal: Ability to remain  free from injury will improve Outcome: Progressing   Problem: Skin Integrity: Goal: Risk for impaired skin integrity will decrease Outcome: Progressing

## 2022-12-27 NOTE — Progress Notes (Signed)
Mobility Specialist Progress Note;   12/27/22 1045  Mobility  Activity Transferred to/from [facility];Ambulated with assistance in room  Level of Assistance Contact guard assist, steadying assist  Assistive Device Front wheel walker  Distance Ambulated (ft) 15 ft  Activity Response Tolerated well  Mobility Referral Yes  $Mobility charge 1 Mobility  Mobility Specialist Start Time (ACUTE ONLY) 1045  Mobility Specialist Stop Time (ACUTE ONLY) 1105  Mobility Specialist Time Calculation (min) (ACUTE ONLY) 20 min    Pre-mobility: SPO2 94% 4.5LO2 During-mobility: SPO2 86-91% 6LO2 Post-mobility: SPO2 91% 4.5LO2  Pt agreeable to mobility after encouragement. Required MinG assistance with ambulation. Requested to use BSC during ambulation. Pt desat to 86% on 6LO2, recovered quickly at EOS. Pt left in chair on 4.5LO2 with all needs met.   Caesar Bookman Mobility Specialist Please contact via SecureChat or Rehab Office [phone]

## 2022-12-28 ENCOUNTER — Inpatient Hospital Stay (HOSPITAL_COMMUNITY): Payer: Medicare HMO

## 2022-12-28 DIAGNOSIS — J189 Pneumonia, unspecified organism: Secondary | ICD-10-CM | POA: Diagnosis not present

## 2022-12-28 DIAGNOSIS — J9601 Acute respiratory failure with hypoxia: Secondary | ICD-10-CM | POA: Diagnosis not present

## 2022-12-28 DIAGNOSIS — J9621 Acute and chronic respiratory failure with hypoxia: Secondary | ICD-10-CM | POA: Diagnosis not present

## 2022-12-28 DIAGNOSIS — I4891 Unspecified atrial fibrillation: Secondary | ICD-10-CM | POA: Diagnosis not present

## 2022-12-28 DIAGNOSIS — J90 Pleural effusion, not elsewhere classified: Secondary | ICD-10-CM | POA: Diagnosis not present

## 2022-12-28 LAB — GLUCOSE, CAPILLARY: Glucose-Capillary: 118 mg/dL — ABNORMAL HIGH (ref 70–99)

## 2022-12-28 MED ORDER — DILTIAZEM HCL 30 MG PO TABS: 30.0000 mg | ORAL_TABLET | Freq: Three times a day (TID) | ORAL | Status: DC

## 2022-12-28 MED ORDER — FUROSEMIDE 40 MG PO TABS
40.0000 mg | ORAL_TABLET | Freq: Every day | ORAL | Status: DC
  Administered 2022-12-28 – 2022-12-31 (×4): 40 mg via ORAL
  Filled 2022-12-28 (×4): qty 1

## 2022-12-28 NOTE — Progress Notes (Signed)
NAME:  Wendelyn Cullipher, MRN:  [id number], DOB:  07/31/1950, LOS: 4 ADMISSION DATE:  12/24/2022, CONSULTATION DATE:  12/26/2022 REFERRING MD:  Odis Luster MD, CHIEF COMPLAINT: Pleural effusion  History of Present Illness:   72 year old with history of diabetes, PE, aortic stenosis, hypertension, stage III lung cancer on chemoradiation presenting with sepsis, acute on chronic respiratory failure, hypoxia and left effusion.  She underwent ultrasound-guided thoracentesis on 10/7 with interventional radiology yielding 850 mL of pleural fluid  Pertinent  Medical History    has a past medical history of Anemia, Aortic stenosis, Arthritis, Coronary artery calcification seen on CT scan, Dyspnea, Essential hypertension, GERD (gastroesophageal reflux disease), History of lung cancer, Port-A-Cath in place (01/06/2019), and Type 2 diabetes mellitus (HCC).   Significant Hospital Events: Including procedures, antibiotic start and stop dates in addition to other pertinent events     Interim History / Subjective:  Reports shortness of breath with any activity  Objective   Blood pressure (!) 103/58, pulse 76, temperature 98.4 F (36.9 C), temperature source Oral, resp. rate (!) 24, SpO2 94%.        Intake/Output Summary (Last 24 hours) at 12/28/2022 1010 Last data filed at 12/28/2022 0947 Gross per 24 hour  Intake 830 ml  Output 5 ml  Net 825 ml   There were no vitals filed for this visit.  Examination: Obese female sitting in the chair and in no acute distress Reports getting short of breath with any activity No JVD or lymphadenopathy is appreciated Decreased breath sounds in the bases Heart sounds are distant Abdomen is obese soft nontender Extremities with edema  Resolved Hospital Problem list     Assessment & Plan:  Acute on chronic respiratory failure with hypoxia Left exudative effusion secondary to pneumonia vs malignancy History of lung cancer, PE in the past.  CTA on admission this  time with no residual clot S/p [facility]acentesis at [facility] Chest x-ray shows no real change in pleural effusions at this time She denies shortness of breath with any activity Continue to follow-up studies Antibiotics Will need for Pleurx catheter in the future   Best Practice (right click and "Reselect all SmartList Selections" daily)   Per primary team  Critical care time: NA   Brett Canales Sederick Jacobsen ACNP Acute Care Nurse Practitioner [facility] Pulmonary/Critical Care Please consult Amion 12/28/2022, 10:10 AM

## 2022-12-28 NOTE — Progress Notes (Signed)
Physical Therapy Treatment Patient Details Name: Denise [name] MRN: [id number] DOB: 1950-12-08 Today's Date: 12/28/2022   History of Present Illness 72 y.o. female admitted on 12/24/2022 with acute on chronic hypoxic respiratory failure with significant left-sided pleural effusion. 12/25/22 Left thoracentesis; sepsis with pna; new onset afib with RVR   PMH significant for diabetes mellitus, pulmonary embolism, aortic stenosis, hypertension, stage IV left lung adenocarcinoma-with metastatic to the right lung and liver    PT Comments  Patient up in recliner and immediately stating she is not going to walk again today (walked earlier with Mobility Team). Reports it takes too much out of her and would not change her mind. Agreed to LE exercises "if you don't stay too long" she said smiling. See below for details.     If plan is discharge home, recommend the following: A little help with walking and/or transfers;A little help with bathing/dressing/bathroom;Assistance with cooking/housework;Assist for transportation;Help with stairs or ramp for entrance   Can travel by private vehicle        Equipment Recommendations  None recommended by PT    Recommendations for Other Services       Precautions / Restrictions Precautions Precautions: Other (comment) Precaution Comments: watch sats Restrictions Weight Bearing Restrictions: No     Mobility  Bed Mobility               General bed mobility comments: up in recliner (sleeps in recliner here and at home)    Transfers                   General transfer comment: refused due to fatigue from walking earlier with Mobility Team    Ambulation/Gait               General Gait Details: refused due to fatigue from walking earlier with Mobility Team   Stairs             Wheelchair Mobility     Tilt Bed    Modified Rankin (Stroke Patients Only)       Balance                                             Cognition Arousal: Alert Behavior During Therapy: WFL for tasks assessed/performed Overall Cognitive Status: Within Functional Limits for tasks assessed                                          Exercises General Exercises - Lower Extremity Long Arc Quad: AROM, Both, 15 reps, Seated Hip ABduction/ADduction: AROM, Both, 10 reps, Seated Heel Raises: AROM, Both, 20 reps Mini-Sqauts: AROM, Both, 20 reps, Seated    General Comments        Pertinent Vitals/Pain Pain Assessment Pain Assessment: No/denies pain    Home Living                          Prior Function            PT Goals (current goals can now be found in the care plan section) Acute Rehab PT Goals Patient Stated Goal: go home when able to breathe better Time For Goal Achievement: 01/09/23 Potential to Achieve Goals: Good Progress towards PT goals: Progressing toward goals  Frequency    Min 1X/week      PT Plan      Co-evaluation              AM-PAC PT "6 Clicks" Mobility   Outcome Measure  Help needed turning from your back to your side while in a flat bed without using bedrails?: A Little Help needed moving from lying on your back to sitting on the side of a flat bed without using bedrails?: A Little Help needed moving to and from a bed to a chair (including a wheelchair)?: None Help needed standing up from a chair using your arms (e.g., wheelchair or bedside chair)?: None Help needed to walk in hospital room?: A Little Help needed climbing 3-5 steps with a railing? : Total 6 Click Score: 18    End of Session Equipment Utilized During Treatment: Oxygen Activity Tolerance: Patient limited by fatigue Patient left: in chair;with call bell/phone within reach   PT Visit Diagnosis: Difficulty in walking, not elsewhere classified (R26.2)     Time: 4098-1191 PT Time Calculation (min) (ACUTE ONLY): 9 min  Charges:    $Therapeutic Exercise: 8-22  mins PT General Charges $$ ACUTE PT VISIT: 1 Visit                      Jerolyn Center, PT Acute Rehabilitation Services  Office [phone]    Zena Amos 12/28/2022, 2:17 PM

## 2022-12-28 NOTE — Plan of Care (Signed)
  Problem: Clinical Measurements: Goal: Diagnostic test results will improve Outcome: Progressing   Problem: Health Behavior/Discharge Planning: Goal: Ability to manage health-related needs will improve Outcome: Progressing   Problem: Clinical Measurements: Goal: Ability to maintain clinical measurements within normal limits will improve Outcome: Progressing   Problem: Pain Managment: Goal: General experience of comfort will improve Outcome: Progressing

## 2022-12-28 NOTE — Progress Notes (Signed)
PROGRESS NOTE        PATIENT DETAILS Name: Denise [name] Age: 72 y.o. Sex: female Date of Birth: 09/01/1950 Admit Date: 12/24/2022 Admitting Physician Ejiroghene Wendall Stade, MD UVO:[id number], Earvin Hansen, MD  Brief Summary: Patient is a 72 y.o.  female with history of metastatic/stage IV adenocarcinoma of the lung, chronic toxic respiratory failure on 2 L of oxygen, PE, DM-2, HTN, aortic stenosis-who was admitted to [facility] on 10/6 with shortness of breath-for her to have large left-sided pleural effusion-underwent thoracocentesis-workup was suggestive of an exudate-and transferred to [facility] for pulmonary evaluation.  Significant events: 10/6>> admit to [facility]  Significant studies: 10/6>> CTA chest: No PE, extensive symmetric groundglass/consolidative changes-large left pleural effusion.  Stay able enhancing lesions in the liver bilaterally. 10/7>> echo: EF 65-70%  Significant microbiology/pathology data: 10/6>> COVID/influenza/RSV PCR: Negative 10/6>> blood culture: No growth 10/7>> pleural fluid culture: No growth 10/7>> pleural fluid cytology: No malignancy  Procedures: 10/7>> ultrasound-guided thoracocentesis (WBC 1332-60% neutrophils, LDH 278)  Consults: PCCM  Subjective: No new complaints-sitting comfortably at bedside chair.  Objective: Vitals: Blood pressure (!) 103/58, pulse 76, temperature 98.4 F (36.9 C), temperature source Oral, resp. rate (!) 24, SpO2 94%.   Exam: Gen Exam:Alert awake-not in any distress HEENT:atraumatic, normocephalic Chest: B/L clear to auscultation anteriorly CVS:S1S2 regular Abdomen:soft non tender, non distended Extremities:trace edema Neurology: Non focal Skin: no rash  Pertinent Labs/Radiology:    Latest Ref Rng & Units 12/26/2022    8:55 PM 12/25/2022    3:14 AM 12/24/2022    4:57 PM  CBC  WBC 4.0 - 10.5 K/uL 9.8  9.6  10.2   Hemoglobin 12.0 - 15.0 g/dL 8.2  8.7  9.5   Hematocrit 36.0 - 46.0 % 27.2  29.0   31.9   Platelets 150 - 400 K/uL 349  391  463     Lab Results  Component Value Date   NA 141 12/26/2022   K 4.0 12/26/2022   CL 105 12/26/2022   CO2 28 12/26/2022     Assessment/Plan: Severe sepsis secondary to multifocal pneumonia Acute on chronic hypoxic respiratory failure secondary to PNA-pleural effusion Gradually improving-oxygen requirement slowly coming down-Down to 4 L of oxygen this morning Empiric Rocephin/Zithromax Cultures negative so far.  Exudative pleural effusion Unclear whether this is parapneumonic or related to malignancy Pleural fluid cultures and cytology negative so far Continues to have persistent pleural effusion on repeat CXR today Await recommendations from PCCM.    PAF with RVR Sign rhythm Continue Coreg Stop Cardizem as BP soft. Eliquis  HTN BP on the soft side Continue Coreg Continue midodrine Follow/optimize.  HFpEF Euvolemic BPs soft-midodrine dosage escalated 10/9 Beta-blocker/Lasix  Aortic stenosis chronic issue. Stable for outpatient follow-up  Normocytic anemia Likely due to underlying malignancy No evidence of blood loss Follow CBC  Metastatic adenocarcinoma of the lung Follow-up with oncology in the outpatient setting  History of unprovoked pulmonary embolism Per prior notes-needs indefinite anticoagulation-remains on Eliquis  Morbid Obesity: Estimated body mass index is 40.24 kg/m as calculated from the following:   Height as of 10/30/22: 5\' 2"  (1.575 m).   Weight as of 12/20/22: 99.8 kg.   Code status:   Code Status: Full Code   DVT Prophylaxis: SCDs Start: 12/24/22 2326 apixaban (ELIQUIS) tablet 5 mg    Family Communication: None at bedside   Disposition Plan: Status is:  Inpatient Remains inpatient appropriate because: Severity of illness   Planned Discharge Destination:Home   Diet: Diet Order             Diet Heart Room service appropriate? Yes; Fluid consistency: Thin  Diet effective now                      Antimicrobial agents: Anti-infectives (From admission, onward)    Start     Dose/Rate Route Frequency Ordered Stop   12/25/22 1900  cefTRIAXone (ROCEPHIN) 2 g in sodium chloride 0.9 % 100 mL IVPB        2 g 200 mL/hr over 30 Minutes Intravenous Every 24 hours 12/24/22 2325 12/30/22 1859   12/25/22 1900  azithromycin (ZITHROMAX) 500 mg in sodium chloride 0.9 % 250 mL IVPB        500 mg 250 mL/hr over 60 Minutes Intravenous Every 24 hours 12/24/22 2325 12/30/22 1859   12/24/22 1900  cefTRIAXone (ROCEPHIN) 2 g in sodium chloride 0.9 % 100 mL IVPB        2 g 200 mL/hr over 30 Minutes Intravenous  Once 12/24/22 1847 12/24/22 2013   12/24/22 1900  azithromycin (ZITHROMAX) tablet 500 mg        500 mg Oral  Once 12/24/22 1847 12/24/22 1930        MEDICATIONS: Scheduled Meds:  apixaban  5 mg Oral BID   carvedilol  6.25 mg Oral BID WC   Chlorhexidine Gluconate Cloth  6 each Topical Daily   dextromethorphan-guaiFENesin  1 tablet Oral BID   diltiazem  30 mg Oral Q8H   furosemide  40 mg Oral Daily   midodrine  10 mg Oral TID WC   pantoprazole  40 mg Oral Daily   Continuous Infusions:  azithromycin 500 mg (12/27/22 2031)   cefTRIAXone (ROCEPHIN)  IV 2 g (12/27/22 2030)   PRN Meds:.acetaminophen **OR** acetaminophen, albuterol, guaiFENesin-dextromethorphan, metoprolol tartrate, ondansetron **OR** ondansetron (ZOFRAN) IV, polyethylene glycol, sodium chloride flush   I have personally reviewed following labs and imaging studies  LABORATORY DATA: CBC: Recent Labs  Lab 12/24/22 1657 12/25/22 0314 12/26/22 2055  WBC 10.2 9.6 9.8  NEUTROABS 8.2*  --   --   HGB 9.5* 8.7* 8.2*  HCT 31.9* 29.0* 27.2*  MCV 100.0 100.0 100.0  PLT 463* 391 349    Basic Metabolic Panel: Recent Labs  Lab 12/24/22 1657 12/24/22 2031 12/25/22 0314 12/26/22 2055  NA 139  --  144 141  K 3.4*  --  4.3 4.0  CL 104  --  108 105  CO2 24  --  26 28  GLUCOSE 164*  --  125* 121*   BUN 10  --  10 9  CREATININE 1.00  --  0.88 0.96  CALCIUM 8.8*  --  8.7* 8.8*  MG  --  1.7  --   --     GFR: Estimated Creatinine Clearance: 59.4 mL/min (by C-G formula based on SCr of 0.96 mg/dL).  Liver Function Tests: Recent Labs  Lab 12/24/22 1657 12/26/22 2055  AST 17 17  ALT 13 12  ALKPHOS 97 80  BILITOT 0.6 0.4  PROT 6.8 5.7*  ALBUMIN 2.7* 2.0*   No results for input(s): "LIPASE", "AMYLASE" in the last 168 hours. No results for input(s): "AMMONIA" in the last 168 hours.  Coagulation Profile: No results for input(s): "INR", "PROTIME" in the last 168 hours.  Cardiac Enzymes: No results for input(s): "CKTOTAL", "CKMB", "CKMBINDEX", "TROPONINI" in the  last 168 hours.  BNP (last 3 results) No results for input(s): "PROBNP" in the last 8760 hours.  Lipid Profile: No results for input(s): "CHOL", "HDL", "LDLCALC", "TRIG", "CHOLHDL", "LDLDIRECT" in the last 72 hours.  Thyroid Function Tests: No results for input(s): "TSH", "T4TOTAL", "FREET4", "T3FREE", "THYROIDAB" in the last 72 hours.   Anemia Panel: No results for input(s): "VITAMINB12", "FOLATE", "FERRITIN", "TIBC", "IRON", "RETICCTPCT" in the last 72 hours.  Urine analysis:    Component Value Date/Time   COLORURINE YELLOW 09/18/2022 1355   APPEARANCEUR CLEAR 09/18/2022 1355   LABSPEC 1.029 09/18/2022 1355   PHURINE 5.0 09/18/2022 1355   GLUCOSEU NEGATIVE 09/18/2022 1355   HGBUR NEGATIVE 09/18/2022 1355   BILIRUBINUR MODERATE (A) 09/18/2022 1355   KETONESUR NEGATIVE 09/18/2022 1355   PROTEINUR 30 (A) 09/18/2022 1355   UROBILINOGEN 1.0 03/05/2012 1200   NITRITE NEGATIVE 09/18/2022 1355   LEUKOCYTESUR NEGATIVE 09/18/2022 1355    Sepsis Labs: Lactic Acid, Venous    Component Value Date/Time   LATICACIDVEN 0.7 12/24/2022 1835    MICROBIOLOGY: Recent Results (from the past 240 hour(s))  Resp panel by RT-PCR (RSV, Flu A&B, Covid) Anterior Nasal Swab     Status: None   Collection Time: 12/24/22   4:01 PM   Specimen: Anterior Nasal Swab  Result Value Ref Range Status   SARS Coronavirus 2 by RT PCR NEGATIVE NEGATIVE Final    Comment: (NOTE) SARS-CoV-2 target nucleic acids are NOT DETECTED.  The SARS-CoV-2 RNA is generally detectable in upper respiratory specimens during the acute phase of infection. The lowest concentration of SARS-CoV-2 viral copies this assay can detect is 138 copies/mL. A negative result does not preclude SARS-Cov-2 infection and should not be used as the sole basis for treatment or other patient management decisions. A negative result may occur with  improper specimen collection/handling, submission of specimen other than nasopharyngeal swab, presence of viral mutation(s) within the areas targeted by this assay, and inadequate number of viral copies(<138 copies/mL). A negative result must be combined with clinical observations, patient history, and epidemiological information. The expected result is Negative.  Fact Sheet for Patients:  BloggerCourse.com  Fact Sheet for Healthcare Providers:  SeriousBroker.it  This test is no t yet approved or cleared by the [place] FDA and  has been authorized for detection and/or diagnosis of SARS-CoV-2 by FDA under an Emergency Use Authorization (EUA). This EUA will remain  in effect (meaning this test can be used) for the duration of the COVID-19 declaration under Section 564(b)(1) of the Act, 21 U.S.C.section 360bbb-3(b)(1), unless the authorization is terminated  or revoked sooner.       Influenza A by PCR NEGATIVE NEGATIVE Final   Influenza B by PCR NEGATIVE NEGATIVE Final    Comment: (NOTE) The Xpert Xpress SARS-CoV-2/FLU/RSV plus assay is intended as an aid in the diagnosis of influenza from Nasopharyngeal swab specimens and should not be used as a sole basis for treatment. Nasal washings and aspirates are unacceptable for Xpert Xpress  SARS-CoV-2/FLU/RSV testing.  Fact Sheet for Patients: BloggerCourse.com  Fact Sheet for Healthcare Providers: SeriousBroker.it  This test is not yet approved or cleared by the [place] FDA and has been authorized for detection and/or diagnosis of SARS-CoV-2 by FDA under an Emergency Use Authorization (EUA). This EUA will remain in effect (meaning this test can be used) for the duration of the COVID-19 declaration under Section 564(b)(1) of the Act, 21 U.S.C. section 360bbb-3(b)(1), unless the authorization is terminated or revoked.  Resp Syncytial Virus by PCR NEGATIVE NEGATIVE Final    Comment: (NOTE) Fact Sheet for Patients: BloggerCourse.com  Fact Sheet for Healthcare Providers: SeriousBroker.it  This test is not yet approved or cleared by the [place] FDA and has been authorized for detection and/or diagnosis of SARS-CoV-2 by FDA under an Emergency Use Authorization (EUA). This EUA will remain in effect (meaning this test can be used) for the duration of the COVID-19 declaration under Section 564(b)(1) of the Act, 21 U.S.C. section 360bbb-3(b)(1), unless the authorization is terminated or revoked.  Performed at [facility], [address], [place], [place] [address]   Culture, blood (routine x 2)     Status: None (Preliminary result)   Collection Time: 12/24/22  6:35 PM   Specimen: Left Antecubital; Blood  Result Value Ref Range Status   Specimen Description LEFT ANTECUBITAL  Final   Special Requests NONE  Final   Culture   Final    NO GROWTH 4 DAYS Performed at [facility], [address], [place], [place] [address]    Report Status PENDING  Incomplete  Culture, blood (routine x 2)     Status: None (Preliminary result)   Collection Time: 12/24/22  8:31 PM   Specimen: Left Antecubital; Blood  Result Value Ref Range Status   Specimen Description LEFT  ANTECUBITAL BLOOD  Final   Special Requests   Final    BOTTLES DRAWN AEROBIC ONLY Blood Culture adequate volume   Culture   Final    NO GROWTH 4 DAYS Performed at [facility], [address], [place], [place] [address]    Report Status PENDING  Incomplete  Gram stain     Status: None   Collection Time: 12/25/22 10:35 AM   Specimen: Pleura  Result Value Ref Range Status   Specimen Description PLEURAL  Final   Special Requests NONE  Final   Gram Stain   Final    NO ORGANISMS SEEN WBC PRESENT,BOTH PMN AND MONONUCLEAR Performed at [facility], [address], [place], [place] [address]    Report Status 12/25/2022 FINAL  Final  Culture, body fluid w Gram Stain-bottle     Status: None (Preliminary result)   Collection Time: 12/25/22 10:35 AM   Specimen: Pleura  Result Value Ref Range Status   Specimen Description PLEURAL  Final   Special Requests BOTTLES DRAWN AEROBIC AND ANAEROBIC 10CC  Final   Culture   Final    NO GROWTH 3 DAYS Performed at [facility], [address], [place], [place] [address]    Report Status PENDING  Incomplete    RADIOLOGY STUDIES/RESULTS: DG CHEST PORT 1 VIEW  Result Date: 12/28/2022 CLINICAL DATA:  Shortness of breath EXAM: PORTABLE CHEST 1 VIEW COMPARISON:  12/26/2022 FINDINGS: Single frontal view of the chest demonstrates a stable cardiac silhouette. Stable chronic consolidation and volume loss within the left hemithorax consistent with post therapeutic changes in a patient with a known history of lung cancer. Stable bilateral pleural effusions, left greater than right. No new consolidation. No pneumothorax. Stable right chest wall port. IMPRESSION: 1. Stable bilateral pleural effusions, left greater than right. 2. Stable chronic consolidation within the left lung consistent with post therapeutic changes in a patient with a history of lung cancer. Electronically Signed   By: Sharlet Salina M.D.   On: 12/28/2022 09:11     LOS: 4 days   Jeoffrey Massed,  MD  [facility]    To contact the attending provider between 7A-7P or the covering  provider during after hours 7P-7A, please log into the web site [email or web address] and access using universal [place] password for that web site. If you do not have the password, please call the hospital operator.  12/28/2022, 11:37 AM

## 2022-12-28 NOTE — Plan of Care (Signed)
Pt has rested quietly throughout the night with no distress noted. Alert and oriented. On O2 5LHFNC. SB/SR on the monitor. Up to [facility] to void. Slept in recliner all night. No complaints voiced.     Problem: Fluid Volume: Goal: Hemodynamic stability will improve Outcome: Progressing   Problem: Clinical Measurements: Goal: Diagnostic test results will improve Outcome: Progressing Goal: Signs and symptoms of infection will decrease Outcome: Progressing   Problem: Respiratory: Goal: Ability to maintain adequate ventilation will improve Outcome: Progressing   Problem: Education: Goal: Knowledge of General Education information will improve Description: Including pain rating scale, medication(s)/side effects and non-pharmacologic comfort measures Outcome: Progressing   Problem: Health Behavior/Discharge Planning: Goal: Ability to manage health-related needs will improve Outcome: Progressing   Problem: Clinical Measurements: Goal: Ability to maintain clinical measurements within normal limits will improve Outcome: Progressing Goal: Will remain free from infection Outcome: Progressing Goal: Diagnostic test results will improve Outcome: Progressing Goal: Respiratory complications will improve Outcome: Progressing Goal: Cardiovascular complication will be avoided Outcome: Progressing   Problem: Activity: Goal: Risk for activity intolerance will decrease Outcome: Progressing   Problem: Nutrition: Goal: Adequate nutrition will be maintained Outcome: Progressing   Problem: Coping: Goal: Level of anxiety will decrease Outcome: Progressing   Problem: Elimination: Goal: Will not experience complications related to bowel motility Outcome: Progressing Goal: Will not experience complications related to urinary retention Outcome: Progressing   Problem: Pain Managment: Goal: General experience of comfort will improve Outcome: Progressing   Problem: Safety: Goal: Ability to  remain free from injury will improve Outcome: Progressing   Problem: Skin Integrity: Goal: Risk for impaired skin integrity will decrease Outcome: Progressing

## 2022-12-28 NOTE — Progress Notes (Signed)
Mobility Specialist Progress Note;   12/28/22 1120  Mobility  Activity Ambulated with assistance in room  Level of Assistance Contact guard assist, steadying assist  Assistive Device Front wheel walker  Distance Ambulated (ft) 25 ft  Activity Response Tolerated well  Mobility Referral Yes  $Mobility charge 1 Mobility  Mobility Specialist Start Time (ACUTE ONLY) 1120  Mobility Specialist Stop Time (ACUTE ONLY) 1135  Mobility Specialist Time Calculation (min) (ACUTE ONLY) 15 min    Pre-mobility: SPO2 94% 2LO2 Post-mobility: SPO2 86-91% 2LO2  Pt agreeable to mobility with encouragement. Required MinG assistance during ambulation. Unable to get reliable SPO2 reading during ambulation, however, pt displayed SOB. Ambulated on 4LO2 d/t low SPO2 readings. Pt recovered to >88% after approx 3 minutes. Pt back in chair with all needs met.   Caesar Bookman Mobility Specialist Please contact via SecureChat or Rehab Office [phone]

## 2022-12-29 ENCOUNTER — Inpatient Hospital Stay (HOSPITAL_COMMUNITY): Payer: Medicare HMO

## 2022-12-29 DIAGNOSIS — J90 Pleural effusion, not elsewhere classified: Secondary | ICD-10-CM | POA: Diagnosis not present

## 2022-12-29 DIAGNOSIS — J9601 Acute respiratory failure with hypoxia: Secondary | ICD-10-CM

## 2022-12-29 DIAGNOSIS — J189 Pneumonia, unspecified organism: Secondary | ICD-10-CM | POA: Diagnosis not present

## 2022-12-29 LAB — GLUCOSE, CAPILLARY
Glucose-Capillary: 104 mg/dL — ABNORMAL HIGH (ref 70–99)
Glucose-Capillary: 98 mg/dL (ref 70–99)

## 2022-12-29 LAB — BASIC METABOLIC PANEL
Anion gap: 16 — ABNORMAL HIGH (ref 5–15)
BUN: 11 mg/dL (ref 8–23)
CO2: 25 mmol/L (ref 22–32)
Calcium: 8.8 mg/dL — ABNORMAL LOW (ref 8.9–10.3)
Chloride: 103 mmol/L (ref 98–111)
Creatinine, Ser: 0.94 mg/dL (ref 0.44–1.00)
GFR, Estimated: >60 mL/min (ref >60)
Glucose, Bld: 98 mg/dL (ref 70–99)
Potassium: 3.3 mmol/L — ABNORMAL LOW (ref 3.5–5.1)
Sodium: 144 mmol/L (ref 135–145)

## 2022-12-29 LAB — BODY FLUID CELL COUNT WITH DIFFERENTIAL
Eos, Fluid: 3 %
Lymphs, Fluid: 26 %
Monocyte-Macrophage-Serous Fluid: 6 % — ABNORMAL LOW (ref 50–90)
Neutrophil Count, Fluid: 63 % — ABNORMAL HIGH (ref 0–25)
Other Cells, Fluid: 2 %
Total Nucleated Cell Count, Fluid: 401 uL (ref 0–1000)

## 2022-12-29 LAB — LACTATE DEHYDROGENASE: LDH: 178 U/L (ref 98–192)

## 2022-12-29 LAB — CULTURE, BLOOD (ROUTINE X 2)
Culture: NO GROWTH
Culture: NO GROWTH
Special Requests: ADEQUATE

## 2022-12-29 LAB — CBC
HCT: 26.2 % — ABNORMAL LOW (ref 36.0–46.0)
Hemoglobin: 7.8 g/dL — ABNORMAL LOW (ref 12.0–15.0)
MCH: 29.4 pg (ref 26.0–34.0)
MCHC: 29.8 g/dL — ABNORMAL LOW (ref 30.0–36.0)
MCV: 98.9 fL (ref 80.0–100.0)
Platelets: 355 10*3/uL (ref 150–400)
RBC: 2.65 MIL/uL — ABNORMAL LOW (ref 3.87–5.11)
RDW: 16.1 % — ABNORMAL HIGH (ref 11.5–15.5)
WBC: 9.6 10*3/uL (ref 4.0–10.5)
nRBC: 0.2 % (ref 0.0–0.2)

## 2022-12-29 LAB — LACTATE DEHYDROGENASE, PLEURAL OR PERITONEAL FLUID: LD, Fluid: 385 U/L — ABNORMAL HIGH (ref 3–23)

## 2022-12-29 MED ORDER — SODIUM CHLORIDE 0.9% FLUSH
10.0000 mL | Freq: Three times a day (TID) | INTRAVENOUS | Status: DC
  Administered 2022-12-29 – 2023-01-02 (×6): 10 mL via INTRAPLEURAL

## 2022-12-29 MED ORDER — CARVEDILOL 3.125 MG PO TABS
3.1250 mg | ORAL_TABLET | Freq: Two times a day (BID) | ORAL | Status: DC
  Administered 2022-12-29 – 2023-01-02 (×8): 3.125 mg via ORAL
  Filled 2022-12-29 (×8): qty 1

## 2022-12-29 MED ORDER — POTASSIUM CHLORIDE CRYS ER 20 MEQ PO TBCR
40.0000 meq | EXTENDED_RELEASE_TABLET | Freq: Once | ORAL | Status: AC
  Administered 2022-12-29: 40 meq via ORAL
  Filled 2022-12-29: qty 2

## 2022-12-29 MED ORDER — OXYCODONE HCL 5 MG PO TABS
5.0000 mg | ORAL_TABLET | ORAL | Status: DC | PRN
  Administered 2022-12-29 – 2022-12-30 (×3): 5 mg via ORAL
  Filled 2022-12-29 (×3): qty 1

## 2022-12-29 MED ORDER — LIDOCAINE 5 % EX PTCH
1.0000 | MEDICATED_PATCH | CUTANEOUS | Status: DC
  Administered 2022-12-29 – 2022-12-31 (×2): 1 via TRANSDERMAL
  Filled 2022-12-29 (×4): qty 1

## 2022-12-29 MED ORDER — ORAL CARE MOUTH RINSE: 15.0000 mL | OROMUCOSAL | Status: DC | PRN

## 2022-12-29 NOTE — Progress Notes (Addendum)
PROGRESS NOTE        PATIENT DETAILS Name: Denise [name] Age: 72 y.o. Sex: female Date of Birth: [birth year] Admit Date: 12/24/2022 Admitting Physician Ejiroghene Wendall Stade, MD ZOX:WRUE, Earvin Hansen, MD  Brief Summary: Patient is a 72 y.o.  female with history of metastatic/stage IV adenocarcinoma of the lung, chronic toxic respiratory failure on 2 L of oxygen, PE, DM-2, HTN, aortic stenosis-who was admitted to [facility] on 10/6 with shortness of breath-for her to have large left-sided pleural effusion-underwent thoracocentesis-workup was suggestive of an exudate-and transferred to [facility] for pulmonary evaluation.  Significant events: 10/6>> admit to [facility]  Significant studies: 10/6>> CTA chest: No PE, extensive symmetric groundglass/consolidative changes-large left pleural effusion.  Stay able enhancing lesions in the liver bilaterally. 10/7>> echo: EF 65-70%  Significant microbiology/pathology data: 10/6>> COVID/influenza/RSV PCR: Negative 10/6>> blood culture: No growth 10/7>> pleural fluid culture: No growth 10/7>> pleural fluid cytology: No malignancy  Procedures: 10/7>> ultrasound-guided thoracocentesis (WBC 1332-60% neutrophils, LDH 278)  Consults: PCCM  Subjective: No new complaints-for chest tube placement later today.  Objective: Vitals: Blood pressure 98/60, pulse 84, temperature 97.8 F (36.6 C), temperature source Oral, resp. rate 18, SpO2 97%.   Exam: Gen Exam:Alert awake-not in any distress HEENT:atraumatic, normocephalic Chest: B/L clear to auscultation anteriorly CVS:S1S2 regular Abdomen:soft non tender, non distended Extremities:no edema Neurology: Non focal Skin: no rash  Pertinent Labs/Radiology:    Latest Ref Rng & Units 12/29/2022    8:16 AM 12/26/2022    8:55 PM 12/25/2022    3:14 AM  CBC  WBC 4.0 - 10.5 K/uL 9.6  9.8  9.6   Hemoglobin 12.0 - 15.0 g/dL 7.8  8.2  8.7   Hematocrit 36.0 - 46.0 % 26.2  27.2  29.0    Platelets 150 - 400 K/uL 355  349  391     Lab Results  Component Value Date   NA 144 12/29/2022   K 3.3 (L) 12/29/2022   CL 103 12/29/2022   CO2 25 12/29/2022     Assessment/Plan: Severe sepsis secondary to multifocal pneumonia Acute on chronic hypoxic respiratory failure secondary to PNA-pleural effusion Gradually improving-oxygen requirement slowly coming down-Down to 4 L of oxygen this morning Empiric Rocephin/Zithromax Cultures negative so far.  Exudative pleural effusion Unclear whether this is parapneumonic or related to malignancy Pleural fluid cultures and cytology negative so far PCCM planning chest tube later today.  PAF with RVR Sign rhythm Continue Coreg Cardizem discontinued on 10/10 Eliquis held for chest tube placement-resume when okay with PCCM  HTN BP on the soft side Continue Coreg Continue midodrine Follow/optimize.  Chronic HFpEF Euvolemic BPs soft-midodrine dosage escalated 10/9 Beta-blocker/Lasix  Aortic stenosis chronic issue. Stable for outpatient follow-up  Normocytic anemia Likely due to underlying malignancy No evidence of blood loss Follow CBC  Metastatic adenocarcinoma of the lung Follow-up with oncology in the outpatient setting  History of unprovoked pulmonary embolism Per prior notes-needs indefinite anticoagulation-remains on Eliquis  Morbid Obesity: Estimated body mass index is 40.24 kg/m as calculated from the following:   Height as of 10/30/22: 5\' 2"  (1.575 m).   Weight as of 12/20/22: 99.8 kg.   Code status:   Code Status: Full Code   DVT Prophylaxis: SCDs Start: 12/24/22 2326    Family Communication: None at bedside   Disposition Plan: Status is: Inpatient Remains inpatient appropriate because: Severity  of illness   Planned Discharge Destination:Home   Diet: Diet Order             Diet Heart Room service appropriate? Yes; Fluid consistency: Thin  Diet effective now                      Antimicrobial agents: Anti-infectives (From admission, onward)    Start     Dose/Rate Route Frequency Ordered Stop   12/25/22 1900  cefTRIAXone (ROCEPHIN) 2 g in sodium chloride 0.9 % 100 mL IVPB        2 g 200 mL/hr over 30 Minutes Intravenous Every 24 hours 12/24/22 2325 12/30/22 1859   12/25/22 1900  azithromycin (ZITHROMAX) 500 mg in sodium chloride 0.9 % 250 mL IVPB        500 mg 250 mL/hr over 60 Minutes Intravenous Every 24 hours 12/24/22 2325 12/30/22 1859   12/24/22 1900  cefTRIAXone (ROCEPHIN) 2 g in sodium chloride 0.9 % 100 mL IVPB        2 g 200 mL/hr over 30 Minutes Intravenous  Once 12/24/22 1847 12/24/22 2013   12/24/22 1900  azithromycin (ZITHROMAX) tablet 500 mg        500 mg Oral  Once 12/24/22 1847 12/24/22 1930        MEDICATIONS: Scheduled Meds:  carvedilol  6.25 mg Oral BID WC   Chlorhexidine Gluconate Cloth  6 each Topical Daily   dextromethorphan-guaiFENesin  1 tablet Oral BID   furosemide  40 mg Oral Daily   midodrine  10 mg Oral TID WC   pantoprazole  40 mg Oral Daily   Continuous Infusions:  azithromycin 500 mg (12/28/22 2118)   cefTRIAXone (ROCEPHIN)  IV 2 g (12/28/22 2113)   PRN Meds:.acetaminophen **OR** acetaminophen, albuterol, guaiFENesin-dextromethorphan, metoprolol tartrate, ondansetron **OR** ondansetron (ZOFRAN) IV, mouth rinse, polyethylene glycol, sodium chloride flush   I have personally reviewed following labs and imaging studies  LABORATORY DATA: CBC: Recent Labs  Lab 12/24/22 1657 12/25/22 0314 12/26/22 2055 12/29/22 0816  WBC 10.2 9.6 9.8 9.6  NEUTROABS 8.2*  --   --   --   HGB 9.5* 8.7* 8.2* 7.8*  HCT 31.9* 29.0* 27.2* 26.2*  MCV 100.0 100.0 100.0 98.9  PLT 463* 391 349 355    Basic Metabolic Panel: Recent Labs  Lab 12/24/22 1657 12/24/22 2031 12/25/22 0314 12/26/22 2055 12/29/22 0816  NA 139  --  144 141 144  K 3.4*  --  4.3 4.0 3.3*  CL 104  --  108 105 103  CO2 24  --  26 28 25   GLUCOSE 164*  --   125* 121* 98  BUN 10  --  10 9 11   CREATININE 1.00  --  0.88 0.96 0.94  CALCIUM 8.8*  --  8.7* 8.8* 8.8*  MG  --  1.7  --   --   --     GFR: Estimated Creatinine Clearance: 60.7 mL/min (by C-G formula based on SCr of 0.94 mg/dL).  Liver Function Tests: Recent Labs  Lab 12/24/22 1657 12/26/22 2055  AST 17 17  ALT 13 12  ALKPHOS 97 80  BILITOT 0.6 0.4  PROT 6.8 5.7*  ALBUMIN 2.7* 2.0*   No results for input(s): "LIPASE", "AMYLASE" in the last 168 hours. No results for input(s): "AMMONIA" in the last 168 hours.  Coagulation Profile: No results for input(s): "INR", "PROTIME" in the last 168 hours.  Cardiac Enzymes: No results for input(s): "CKTOTAL", "CKMB", "CKMBINDEX", "  TROPONINI" in the last 168 hours.  BNP (last 3 results) No results for input(s): "PROBNP" in the last 8760 hours.  Lipid Profile: No results for input(s): "CHOL", "HDL", "LDLCALC", "TRIG", "CHOLHDL", "LDLDIRECT" in the last 72 hours.  Thyroid Function Tests: No results for input(s): "TSH", "T4TOTAL", "FREET4", "T3FREE", "THYROIDAB" in the last 72 hours.   Anemia Panel: No results for input(s): "VITAMINB12", "FOLATE", "FERRITIN", "TIBC", "IRON", "RETICCTPCT" in the last 72 hours.  Urine analysis:    Component Value Date/Time   COLORURINE YELLOW 09/18/2022 1355   APPEARANCEUR CLEAR 09/18/2022 1355   LABSPEC 1.029 09/18/2022 1355   PHURINE 5.0 09/18/2022 1355   GLUCOSEU NEGATIVE 09/18/2022 1355   HGBUR NEGATIVE 09/18/2022 1355   BILIRUBINUR MODERATE (A) 09/18/2022 1355   KETONESUR NEGATIVE 09/18/2022 1355   PROTEINUR 30 (A) 09/18/2022 1355   UROBILINOGEN 1.0 03/05/2012 1200   NITRITE NEGATIVE 09/18/2022 1355   LEUKOCYTESUR NEGATIVE 09/18/2022 1355    Sepsis Labs: Lactic Acid, Venous    Component Value Date/Time   LATICACIDVEN 0.7 12/24/2022 1835    MICROBIOLOGY: Recent Results (from the past 240 hour(s))  Resp panel by RT-PCR (RSV, Flu A&B, Covid) Anterior Nasal Swab     Status: None    Collection Time: 12/24/22  4:01 PM   Specimen: Anterior Nasal Swab  Result Value Ref Range Status   SARS Coronavirus 2 by RT PCR NEGATIVE NEGATIVE Final    Comment: (NOTE) SARS-CoV-2 target nucleic acids are NOT DETECTED.  The SARS-CoV-2 RNA is generally detectable in upper respiratory specimens during the acute phase of infection. The lowest concentration of SARS-CoV-2 viral copies this assay can detect is 138 copies/mL. A negative result does not preclude SARS-Cov-2 infection and should not be used as the sole basis for treatment or other patient management decisions. A negative result may occur with  improper specimen collection/handling, submission of specimen other than nasopharyngeal swab, presence of viral mutation(s) within the areas targeted by this assay, and inadequate number of viral copies(<138 copies/mL). A negative result must be combined with clinical observations, patient history, and epidemiological information. The expected result is Negative.  Fact Sheet for Patients:  BloggerCourse.com  Fact Sheet for Healthcare Providers:  SeriousBroker.it  This test is no t yet approved or cleared by the [place] FDA and  has been authorized for detection and/or diagnosis of SARS-CoV-2 by FDA under an Emergency Use Authorization (EUA). This EUA will remain  in effect (meaning this test can be used) for the duration of the COVID-19 declaration under Section 564(b)(1) of the Act, 21 U.S.C.section 360bbb-3(b)(1), unless the authorization is terminated  or revoked sooner.       Influenza A by PCR NEGATIVE NEGATIVE Final   Influenza B by PCR NEGATIVE NEGATIVE Final    Comment: (NOTE) The Xpert Xpress SARS-CoV-2/FLU/RSV plus assay is intended as an aid in the diagnosis of influenza from Nasopharyngeal swab specimens and should not be used as a sole basis for treatment. Nasal washings and aspirates are unacceptable for  Xpert Xpress SARS-CoV-2/FLU/RSV testing.  Fact Sheet for Patients: BloggerCourse.com  Fact Sheet for Healthcare Providers: SeriousBroker.it  This test is not yet approved or cleared by the [place] FDA and has been authorized for detection and/or diagnosis of SARS-CoV-2 by FDA under an Emergency Use Authorization (EUA). This EUA will remain in effect (meaning this test can be used) for the duration of the COVID-19 declaration under Section 564(b)(1) of the Act, 21 U.S.C. section 360bbb-3(b)(1), unless the authorization is terminated or revoked.  Resp Syncytial Virus by PCR NEGATIVE NEGATIVE Final    Comment: (NOTE) Fact Sheet for Patients: BloggerCourse.com  Fact Sheet for Healthcare Providers: SeriousBroker.it  This test is not yet approved or cleared by the [place] FDA and has been authorized for detection and/or diagnosis of SARS-CoV-2 by FDA under an Emergency Use Authorization (EUA). This EUA will remain in effect (meaning this test can be used) for the duration of the COVID-19 declaration under Section 564(b)(1) of the Act, 21 U.S.C. section 360bbb-3(b)(1), unless the authorization is terminated or revoked.  Performed at [facility], [address], [place], [place] [address]   Culture, blood (routine x 2)     Status: None   Collection Time: 12/24/22  6:35 PM   Specimen: Left Antecubital; Blood  Result Value Ref Range Status   Specimen Description LEFT ANTECUBITAL  Final   Special Requests NONE  Final   Culture   Final    NO GROWTH 5 DAYS Performed at [facility], [address], [place], [place] [address]    Report Status 12/29/2022 FINAL  Final  Culture, blood (routine x 2)     Status: None   Collection Time: 12/24/22  8:31 PM   Specimen: Left Antecubital; Blood  Result Value Ref Range Status   Specimen Description LEFT ANTECUBITAL BLOOD  Final    Special Requests   Final    BOTTLES DRAWN AEROBIC ONLY Blood Culture adequate volume   Culture   Final    NO GROWTH 5 DAYS Performed at [facility], [address] [place], [place] [address]    Report Status 12/29/2022 FINAL  Final  Gram stain     Status: None   Collection Time: 12/25/22 10:35 AM   Specimen: Pleura  Result Value Ref Range Status   Specimen Description PLEURAL  Final   Special Requests NONE  Final   Gram Stain   Final    NO ORGANISMS SEEN WBC PRESENT,BOTH PMN AND MONONUCLEAR Performed at [facility], [address] [place], [place] [address]    Report Status 12/25/2022 FINAL  Final  Culture, body fluid w Gram Stain-bottle     Status: None (Preliminary result)   Collection Time: 12/25/22 10:35 AM   Specimen: Pleura  Result Value Ref Range Status   Specimen Description PLEURAL  Final   Special Requests BOTTLES DRAWN AEROBIC AND ANAEROBIC 10CC  Final   Culture   Final    NO GROWTH 4 DAYS Performed at [facility], [address] [place], [place] [address]    Report Status PENDING  Incomplete    RADIOLOGY STUDIES/RESULTS: DG CHEST PORT 1 VIEW  Result Date: 12/28/2022 CLINICAL DATA:  Shortness of breath EXAM: PORTABLE CHEST 1 VIEW COMPARISON:  12/26/2022 FINDINGS: Single frontal view of the chest demonstrates a stable cardiac silhouette. Stable chronic consolidation and volume loss within the left hemithorax consistent with post therapeutic changes in a patient with a known history of lung cancer. Stable bilateral pleural effusions, left greater than right. No new consolidation. No pneumothorax. Stable right chest wall port. IMPRESSION: 1. Stable bilateral pleural effusions, left greater than right. 2. Stable chronic consolidation within the left lung consistent with post therapeutic changes in a patient with a history of lung cancer. Electronically Signed   By: Sharlet Salina M.D.   On: 12/28/2022 09:11     LOS: 5 days   Jeoffrey Massed, MD  [facility]    To contact the attending provider between 7A-7P or the covering provider during  after hours 7P-7A, please log into the web site [email or web address] and access using universal  password for that web site. If you do not have the password, please call the hospital operator.  12/29/2022, 11:08 AM

## 2022-12-29 NOTE — Plan of Care (Signed)
  Problem: Respiratory: Goal: Ability to maintain adequate ventilation will improve Outcome: Progressing   Problem: Health Behavior/Discharge Planning: Goal: Ability to manage health-related needs will improve Outcome: Progressing   Problem: Clinical Measurements: Goal: Ability to maintain clinical measurements within normal limits will improve Outcome: Progressing   Problem: Pain Managment: Goal: General experience of comfort will improve Outcome: Progressing

## 2022-12-29 NOTE — Progress Notes (Signed)
PT Cancellation Note  Patient Details Name: Denise [name] MRN: [id number] DOB: [birth year]   Cancelled Treatment:    Reason Eval/Treat Not Completed: Pain limiting ability to participate Pt received in recliner, visitor present. Pt politely declines participation in therapy, noting 9/10 pain as she just had chest tube placed. PT encouraged pt to perform LE exercises & pt reports she has been. Will f/u as able.  Aleda Grana, PT, DPT 12/29/22, 4:11 PM   Sandi Mariscal 12/29/2022, 4:11 PM

## 2022-12-29 NOTE — Procedures (Signed)
Insertion of Chest Tube Procedure Note  Pamila Mendibles  [id number]  February 11, 1951  Date:12/29/22  Time:3:29 PM    Provider Performing: Tim Lair   Procedure: Pleural Catheter Insertion w/ Imaging Guidance (40981)  Indication(s): Effusion  Consent: Risks of the procedure as well as the alternatives and risks of each were explained to the patient and/or caregiver.  Consent for the procedure was obtained and is signed in the bedside chart  Anesthesia: Topical only with 1% lidocaine   Time Out: Verified patient identification, verified procedure, site/side was marked, verified correct patient position, special equipment/implants available, medications/allergies/relevant history reviewed, required imaging and test results available.  Sterile Technique: Maximal sterile technique including full sterile barrier drape, hand hygiene, sterile gown, sterile gloves, mask, hair covering, sterile ultrasound probe cover (if used).  Procedure Description: Ultrasound used to identify appropriate pleural anatomy for placement and overlying skin marked. Area of placement cleaned and draped in sterile fashion.  A 14 French pigtail pleural catheter was placed into the left pleural space using Seldinger technique. Appropriate return of brown-red sanguinous fluid was obtained.  The tube was connected to atrium and placed on -20 cm H2O wall suction.  Complications/Tolerance: None; patient tolerated the procedure well. Chest X-ray is ordered to verify placement.  EBL: Minimal  Specimen(s): fluid  Tim Lair, [place] [facility] 12/29/22 3:32 PM  Please see [email or web address] for pager details.  From 7A-7P if no response, please call [phone] After hours, please call ELink [phone]

## 2022-12-29 NOTE — Plan of Care (Signed)
Pt has rested quietly throughout the night with no distress noted. Alert and oriented. On O2 5LHFNC. SR on the monitor. Up to [facility] to void. Pt sleeping in recliner. NPO for procedure. No complaints voiced.     Problem: Fluid Volume: Goal: Hemodynamic stability will improve Outcome: Progressing   Problem: Clinical Measurements: Goal: Diagnostic test results will improve Outcome: Progressing Goal: Signs and symptoms of infection will decrease Outcome: Progressing   Problem: Respiratory: Goal: Ability to maintain adequate ventilation will improve Outcome: Progressing   Problem: Education: Goal: Knowledge of General Education information will improve Description: Including pain rating scale, medication(s)/side effects and non-pharmacologic comfort measures Outcome: Progressing   Problem: Health Behavior/Discharge Planning: Goal: Ability to manage health-related needs will improve Outcome: Progressing   Problem: Clinical Measurements: Goal: Ability to maintain clinical measurements within normal limits will improve Outcome: Progressing Goal: Will remain free from infection Outcome: Progressing Goal: Diagnostic test results will improve Outcome: Progressing Goal: Respiratory complications will improve Outcome: Progressing Goal: Cardiovascular complication will be avoided Outcome: Progressing   Problem: Activity: Goal: Risk for activity intolerance will decrease Outcome: Progressing   Problem: Nutrition: Goal: Adequate nutrition will be maintained Outcome: Progressing   Problem: Coping: Goal: Level of anxiety will decrease Outcome: Progressing   Problem: Elimination: Goal: Will not experience complications related to bowel motility Outcome: Progressing Goal: Will not experience complications related to urinary retention Outcome: Progressing   Problem: Pain Managment: Goal: General experience of comfort will improve Outcome: Progressing   Problem: Safety: Goal:  Ability to remain free from injury will improve Outcome: Progressing   Problem: Skin Integrity: Goal: Risk for impaired skin integrity will decrease Outcome: Progressing

## 2022-12-29 NOTE — Progress Notes (Signed)
Due to soft BP, ok to reduce coreg to 3.125mg  BID and replace low k with KCL x1 per Dr Jerral Ralph.  Ulyses Southward, PharmD, BCIDP, AAHIVP, CPP Infectious Disease Pharmacist 12/29/2022 1:20 PM

## 2022-12-29 NOTE — Progress Notes (Signed)
NAME:  Denise [name], MRN:  [id number], DOB:  05-Jul-1950, LOS: 5 ADMISSION DATE:  12/24/2022, CONSULTATION DATE:  12/26/2022 REFERRING MD:  Odis Luster MD, CHIEF COMPLAINT: Pleural effusion  History of Present Illness:   72 year old with history of diabetes, PE, aortic stenosis, hypertension, stage III lung cancer on chemoradiation presenting with sepsis, acute on chronic respiratory failure, hypoxia and left effusion.  She underwent ultrasound-guided thoracentesis on 10/7 with interventional radiology yielding 850 mL of pleural fluid  Pertinent  Medical History    has a past medical history of Anemia, Aortic stenosis, Arthritis, Coronary artery calcification seen on CT scan, Dyspnea, Essential hypertension, GERD (gastroesophageal reflux disease), History of lung cancer, Port-A-Cath in place (01/06/2019), and Type 2 diabetes mellitus (HCC).   Significant Hospital Events: Including procedures, antibiotic start and stop dates in addition to other pertinent events     Interim History / Subjective:  Chest tube placed today  Objective   Blood pressure (!) 93/52, pulse 78, temperature 98.2 F (36.8 C), temperature source Oral, resp. rate 20, SpO2 95%.        Intake/Output Summary (Last 24 hours) at 12/29/2022 1612 Last data filed at 12/29/2022 1508 Gross per 24 hour  Intake 610 ml  Output --  Net 610 ml   There were no vitals filed for this visit.  Physical Exam: General: Obese, well-appearing, no acute distress HENT: [place], AT Eyes: EOMI, no scleral icterus Respiratory: Diminished on left.  No crackles, wheezing or rales Cardiovascular: RRR, -M/R/G, no JVD Extremities:-Edema,-tenderness Neuro: AAO x4, CNII-XII grossly intact Psych: Normal mood, normal affect   Resolved Hospital Problem list     Assessment & Plan:  Acute on chronic respiratory failure with hypoxia Left exudative effusion secondary to pneumonia vs malignancy History of lung cancer, PE in the past.  CTA on  admission this time with no residual clot S/p [facility]acentesis at [facility] -Wean supplemental O2 for goal >90% -Continue CAP treatment -Left chest tube in placed 12/29/22 -Chest tube to suction -20 cm H20 -Flushes q8h -May require lytics. Monitor chest tube output -Hold on anticoagulation until we can determine if lytics are needed. Consider starting heparin gtt tomorrow  Best Practice (right click and "Reselect all SmartList Selections" daily)   Per primary team  Critical care time: NA   Mechele Collin, M.D. [facility]Critical Care Medicine 12/29/2022 4:12 PM   See Amion for personal pager For hours between 7 PM to 7 AM, please call Elink for urgent questions

## 2022-12-30 ENCOUNTER — Inpatient Hospital Stay (HOSPITAL_COMMUNITY): Payer: Medicare HMO

## 2022-12-30 DIAGNOSIS — J90 Pleural effusion, not elsewhere classified: Secondary | ICD-10-CM | POA: Diagnosis not present

## 2022-12-30 DIAGNOSIS — J9601 Acute respiratory failure with hypoxia: Secondary | ICD-10-CM | POA: Diagnosis not present

## 2022-12-30 DIAGNOSIS — J189 Pneumonia, unspecified organism: Secondary | ICD-10-CM | POA: Diagnosis not present

## 2022-12-30 LAB — CULTURE, BODY FLUID W GRAM STAIN -BOTTLE: Culture: NO GROWTH

## 2022-12-30 LAB — GLUCOSE, CAPILLARY: Glucose-Capillary: 98 mg/dL (ref 70–99)

## 2022-12-30 MED ORDER — SODIUM CHLORIDE 0.9 % IV SOLN
2.0000 g | INTRAVENOUS | Status: AC
  Administered 2022-12-30 – 2022-12-31 (×2): 2 g via INTRAVENOUS
  Filled 2022-12-30 (×2): qty 20

## 2022-12-30 MED ORDER — HEPARIN (PORCINE) 25000 UT/250ML-% IV SOLN
1400.0000 [IU]/h | INTRAVENOUS | Status: DC
  Administered 2022-12-30: 1000 [IU]/h via INTRAVENOUS
  Administered 2022-12-31 – 2023-01-01 (×3): 1350 [IU]/h via INTRAVENOUS
  Filled 2022-12-30 (×4): qty 250

## 2022-12-30 NOTE — Progress Notes (Signed)
PHARMACY - ANTICOAGULATION CONSULT NOTE  Pharmacy Consult for heparin Indication: atrial fibrillation  No Known Allergies  Patient Measurements: Height: 5\' 2"  (157.5 cm) Weight: 99.8 kg (220 lb) (from 12/20/22) IBW/kg (Calculated) : 50.1 Heparin Dosing Weight: 73.8 kg  Vital Signs: Temp: 97.9 F (36.6 C) (10/12 1144) Temp Source: Oral (10/12 1144) BP: 94/66 (10/12 1144) Pulse Rate: 69 (10/12 1144)  Labs: Recent Labs    12/29/22 0816  HGB 7.8*  HCT 26.2*  PLT 355  CREATININE 0.94    Estimated Creatinine Clearance: 60.7 mL/min (by C-G formula based on SCr of 0.94 mg/dL).   Medical History: Past Medical History:  Diagnosis Date   Anemia    Aortic stenosis    Arthritis    Coronary artery calcification seen on CT scan    Dyspnea    Essential hypertension    GERD (gastroesophageal reflux disease)    History of lung cancer    Stage III adenocarcinoma status post chemoradiation   Port-A-Cath in place 01/06/2019   Type 2 diabetes mellitus (HCC)    Assessment: 72YOF with PMH of metastatic/stage IV adenocarcinoma of the lung, chronic toxic respiratory failure on 2 L of oxygen, PE, DM-2, HTN, aortic stenosis now with new onset afib with RVR. Started on Eliquis during admission and held d/t chest tube placement 10/11. (last dose Eliquis 10/10 ~9AM). Chest tube dislodged, then removed 10/12. Pharmacy consulted to dose heparin.  Hgb 7.8 stable, Plt 355 wnl No signs/symptoms of bleeding noted  Goal of Therapy:  Heparin level 0.3-0.7 units/ml aPTT 66-103 seconds Monitor platelets by anticoagulation protocol: Yes   Plan:  Start heparin infusion at 1000 units/hr Check APTT/anti-Xa level in 8 hours and daily while on heparin Continue to monitor CBC and signs/symptoms of bleeding F/u chest tube replacement  Stephenie Acres, PharmD PGY1 Pharmacy Resident 12/30/2022 3:13 PM

## 2022-12-30 NOTE — Progress Notes (Signed)
Chest tube removed to L side per Dr George Hugh order. Pt tolerated well. VSS. RN to call with any changes or concerns.

## 2022-12-30 NOTE — Progress Notes (Signed)
NAME:  Denise [name], MRN:  [id number], DOB:  Feb 17, 1951, LOS: 6 ADMISSION DATE:  12/24/2022, CONSULTATION DATE:  12/26/2022 REFERRING MD:  Odis Luster MD, CHIEF COMPLAINT: Pleural effusion  History of Present Illness:   72 year old with history of diabetes, PE, aortic stenosis, hypertension, stage III lung cancer on chemoradiation presenting with sepsis, acute on chronic respiratory failure, hypoxia and left effusion.  She underwent ultrasound-guided thoracentesis on 10/7 with interventional radiology yielding 850 mL of pleural fluid  Pertinent  Medical History    has a past medical history of Anemia, Aortic stenosis, Arthritis, Coronary artery calcification seen on CT scan, Dyspnea, Essential hypertension, GERD (gastroesophageal reflux disease), History of lung cancer, Port-A-Cath in place (01/06/2019), and Type 2 diabetes mellitus (HCC).   Significant Hospital Events: Including procedures, antibiotic start and stop dates in addition to other pertinent events   10/11 Left chest tube placed 10/12 Left chest tube dislodged  Interim History / Subjective:  Left chest tube dislodged ~550cc chest tube output Reports able to breathe easier  Objective   Blood pressure 94/66, pulse 69, temperature 97.9 F (36.6 C), temperature source Oral, resp. rate 17, SpO2 97%.        Intake/Output Summary (Last 24 hours) at 12/30/2022 1350 Last data filed at 12/30/2022 0700 Gross per 24 hour  Intake 40 ml  Output 570 ml  Net -530 ml   There were no vitals filed for this visit.   Physical Exam: General: Elderly-appearing, no acute distress HENT: [place], AT, OP clear, MMM Eyes: EOMI, no scleral icterus Respiratory: Diminished bases bilaterally.  No crackles, wheezing or rales Cardiovascular: RRR, -M/R/G, no JVD GI: BS+, soft, nontender Extremities:-Edema,-tenderness Neuro: AAO x4, CNII-XII grossly intact   Resolved Hospital Problem list     Assessment & Plan:  Acute on chronic respiratory  failure with hypoxia Left exudative effusion secondary to pneumonia vs malignancy History of lung cancer, PE in the past.  CTA on admission this time with no residual clot S/p [facility]acentesis at [facility] -Wean supplemental O2 for goal >90% -Continue CAP treatment for 5-7 days total -Left chest tube in placed 12/29/22 but dislodged on 10/12 -Remove chest tube -Discussed risk and benefit of replacing chest tube. Patient clinically improved after 550cc left pleural fluid removal. Procedure was uncomfortable for her so would like to wait and see if she still needs it. We discussed clinical course of chronic effusions including potential scarring in the future.  -Heparin gtt restarted for atrial fib. Will hold in the future if planned to replace chest tube -CXR tomorrow  Best Practice (right click and "Reselect all SmartList Selections" daily)   Per primary team  Critical care time: NA   Care Time: 50 min  Mechele Collin, M.D. [facility] Pulmonary/Critical Care Medicine 12/30/2022 1:50 PM   See Amion for personal pager For hours between 7 PM to 7 AM, please call Elink for urgent questions

## 2022-12-30 NOTE — Progress Notes (Signed)
PROGRESS NOTE        PATIENT DETAILS Name: Denise [name] Age: 72 y.o. Sex: female Date of Birth: 1951-02-21 Admit Date: 12/24/2022 Admitting Physician Ejiroghene Wendall Stade, MD [id number], Earvin Hansen, MD  Brief Summary: Patient is a 72 y.o.  female with history of metastatic/stage IV adenocarcinoma of the lung, chronic toxic respiratory failure on 2 L of oxygen, PE, DM-2, HTN, aortic stenosis-who was admitted to [facility] on 10/6 with shortness of breath-for her to have large left-sided pleural effusion-underwent thoracocentesis-workup was suggestive of an exudate-and transferred to [facility] for pulmonary evaluation.  Significant events: 10/6>> admit to [facility]  Significant studies: 10/6>> CTA chest: No PE, extensive symmetric groundglass/consolidative changes-large left pleural effusion.  Stay able enhancing lesions in the liver bilaterally. 10/7>> echo: EF 65-70%  Significant microbiology/pathology data: 10/6>> COVID/influenza/RSV PCR: Negative 10/6>> blood culture: No growth 10/7>> pleural fluid culture: No growth 10/7>> pleural fluid cytology: No malignancy  Procedures: 10/7>> ultrasound-guided thoracocentesis (WBC 1332-60% neutrophils, LDH 278) 10/11>> chest tube placement by PCCM  Consults: PCCM  Subjective: Feels somewhat better-appears the same-no chest pain.  Objective: Vitals: Blood pressure (!) 93/48, pulse 76, temperature 98.4 F (36.9 C), temperature source Oral, resp. rate 20, SpO2 99%.   Exam: Gen Exam:Alert awake-not in any distress HEENT:atraumatic, normocephalic Chest: B/L clear to auscultation anteriorly CVS:S1S2 regular Abdomen:soft non tender, non distended Extremities:no edema Neurology: Non focal Skin: no rash  Pertinent Labs/Radiology:    Latest Ref Rng & Units 12/29/2022    8:16 AM 12/26/2022    8:55 PM 12/25/2022    3:14 AM  CBC  WBC 4.0 - 10.5 K/uL 9.6  9.8  9.6   Hemoglobin 12.0 - 15.0 g/dL 7.8  8.2  8.7    Hematocrit 36.0 - 46.0 % 26.2  27.2  29.0   Platelets 150 - 400 K/uL 355  349  391     Lab Results  Component Value Date   NA 144 12/29/2022   K 3.3 (L) 12/29/2022   CL 103 12/29/2022   CO2 25 12/29/2022     Assessment/Plan: Severe sepsis secondary to multifocal pneumonia with possible parapneumonic effusion Acute on chronic hypoxic respiratory failure secondary to PNA-pleural effusion Hypoxia is gradually improved with supportive care/thoracocentesis/chest tube tube placement/antibiotics Continue Rocephin for now   Exudative pleural effusion Unclear whether this is parapneumonic or related to malignancy Pleural fluid cultures and cytology negative so far PCCM placed chest tube on 10/11-with tentative plans to start lytic therapy. Empirically covered with Rocephin   PAF with RVR Sign rhythm Continue Coreg Cardizem discontinued on 10/10 Eliquis held for chest tube placement-discussed with Dr. Everardo All 10/12-PCCM-she will let us know when appropriate to start anticoagulation.  HTN BP on the soft side-per patient this is chronic Continue Coreg Continue midodrine Follow/optimize.  Chronic HFpEF Euvolemic BPs soft-midodrine dosage escalated 10/9 Beta-blocker/Lasix  Aortic stenosis chronic issue. Stable for outpatient follow-up  Normocytic anemia Likely due to underlying malignancy No evidence of blood loss Follow CBC  Metastatic adenocarcinoma of the lung Follow-up with oncology in the outpatient setting  History of unprovoked pulmonary embolism Per prior notes-needs indefinite anticoagulation-remains on Eliquis  Morbid Obesity: Estimated body mass index is 40.24 kg/m as calculated from the following:   Height as of 10/30/22: 5\' 2"  (1.575 m).   Weight as of 12/20/22: 99.8 kg.   Code status:   Code Status:  Full Code   DVT Prophylaxis: SCDs Start: 12/24/22 2326    Family Communication: None at bedside   Disposition Plan: Status is: Inpatient Remains  inpatient appropriate because: Severity of illness   Planned Discharge Destination:Home   Diet: Diet Order             Diet Heart Room service appropriate? Yes; Fluid consistency: Thin  Diet effective now                     Antimicrobial agents: Anti-infectives (From admission, onward)    Start     Dose/Rate Route Frequency Ordered Stop   12/25/22 1900  cefTRIAXone (ROCEPHIN) 2 g in sodium chloride 0.9 % 100 mL IVPB        2 g 200 mL/hr over 30 Minutes Intravenous Every 24 hours 12/24/22 2325 12/29/22 2053   12/25/22 1900  azithromycin (ZITHROMAX) 500 mg in sodium chloride 0.9 % 250 mL IVPB        500 mg 250 mL/hr over 60 Minutes Intravenous Every 24 hours 12/24/22 2325 12/29/22 2125   12/24/22 1900  cefTRIAXone (ROCEPHIN) 2 g in sodium chloride 0.9 % 100 mL IVPB        2 g 200 mL/hr over 30 Minutes Intravenous  Once 12/24/22 1847 12/24/22 2013   12/24/22 1900  azithromycin (ZITHROMAX) tablet 500 mg        500 mg Oral  Once 12/24/22 1847 12/24/22 1930        MEDICATIONS: Scheduled Meds:  carvedilol  3.125 mg Oral BID WC   Chlorhexidine Gluconate Cloth  6 each Topical Daily   dextromethorphan-guaiFENesin  1 tablet Oral BID   furosemide  40 mg Oral Daily   lidocaine  1 patch Transdermal Q24H   midodrine  10 mg Oral TID WC   pantoprazole  40 mg Oral Daily   sodium chloride flush  10 mL Intrapleural Q8H   Continuous Infusions:   PRN Meds:.acetaminophen **OR** acetaminophen, albuterol, guaiFENesin-dextromethorphan, metoprolol tartrate, ondansetron **OR** ondansetron (ZOFRAN) IV, mouth rinse, oxyCODONE, polyethylene glycol, sodium chloride flush   I have personally reviewed following labs and imaging studies  LABORATORY DATA: CBC: Recent Labs  Lab 12/24/22 1657 12/25/22 0314 12/26/22 2055 12/29/22 0816  WBC 10.2 9.6 9.8 9.6  NEUTROABS 8.2*  --   --   --   HGB 9.5* 8.7* 8.2* 7.8*  HCT 31.9* 29.0* 27.2* 26.2*  MCV 100.0 100.0 100.0 98.9  PLT 463* 391  349 355    Basic Metabolic Panel: Recent Labs  Lab 12/24/22 1657 12/24/22 2031 12/25/22 0314 12/26/22 2055 12/29/22 0816  NA 139  --  144 141 144  K 3.4*  --  4.3 4.0 3.3*  CL 104  --  108 105 103  CO2 24  --  26 28 25   GLUCOSE 164*  --  125* 121* 98  BUN 10  --  10 9 11   CREATININE 1.00  --  0.88 0.96 0.94  CALCIUM 8.8*  --  8.7* 8.8* 8.8*  MG  --  1.7  --   --   --     GFR: Estimated Creatinine Clearance: 60.7 mL/min (by C-G formula based on SCr of 0.94 mg/dL).  Liver Function Tests: Recent Labs  Lab 12/24/22 1657 12/26/22 2055  AST 17 17  ALT 13 12  ALKPHOS 97 80  BILITOT 0.6 0.4  PROT 6.8 5.7*  ALBUMIN 2.7* 2.0*   No results for input(s): "LIPASE", "AMYLASE" in the last 168 hours. No  results for input(s): "AMMONIA" in the last 168 hours.  Coagulation Profile: No results for input(s): "INR", "PROTIME" in the last 168 hours.  Cardiac Enzymes: No results for input(s): "CKTOTAL", "CKMB", "CKMBINDEX", "TROPONINI" in the last 168 hours.  BNP (last 3 results) No results for input(s): "PROBNP" in the last 8760 hours.  Lipid Profile: No results for input(s): "CHOL", "HDL", "LDLCALC", "TRIG", "CHOLHDL", "LDLDIRECT" in the last 72 hours.  Thyroid Function Tests: No results for input(s): "TSH", "T4TOTAL", "FREET4", "T3FREE", "THYROIDAB" in the last 72 hours.   Anemia Panel: No results for input(s): "VITAMINB12", "FOLATE", "FERRITIN", "TIBC", "IRON", "RETICCTPCT" in the last 72 hours.  Urine analysis:    Component Value Date/Time   COLORURINE YELLOW 09/18/2022 1355   APPEARANCEUR CLEAR 09/18/2022 1355   LABSPEC 1.029 09/18/2022 1355   PHURINE 5.0 09/18/2022 1355   GLUCOSEU NEGATIVE 09/18/2022 1355   HGBUR NEGATIVE 09/18/2022 1355   BILIRUBINUR MODERATE (A) 09/18/2022 1355   KETONESUR NEGATIVE 09/18/2022 1355   PROTEINUR 30 (A) 09/18/2022 1355   UROBILINOGEN 1.0 03/05/2012 1200   NITRITE NEGATIVE 09/18/2022 1355   LEUKOCYTESUR NEGATIVE 09/18/2022 1355     Sepsis Labs: Lactic Acid, Venous    Component Value Date/Time   LATICACIDVEN 0.7 12/24/2022 1835    MICROBIOLOGY: Recent Results (from the past 240 hour(s))  Resp panel by RT-PCR (RSV, Flu A&B, Covid) Anterior Nasal Swab     Status: None   Collection Time: 12/24/22  4:01 PM   Specimen: Anterior Nasal Swab  Result Value Ref Range Status   SARS Coronavirus 2 by RT PCR NEGATIVE NEGATIVE Final    Comment: (NOTE) SARS-CoV-2 target nucleic acids are NOT DETECTED.  The SARS-CoV-2 RNA is generally detectable in upper respiratory specimens during the acute phase of infection. The lowest concentration of SARS-CoV-2 viral copies this assay can detect is 138 copies/mL. A negative result does not preclude SARS-Cov-2 infection and should not be used as the sole basis for treatment or other patient management decisions. A negative result may occur with  improper specimen collection/handling, submission of specimen other than nasopharyngeal swab, presence of viral mutation(s) within the areas targeted by this assay, and inadequate number of viral copies(<138 copies/mL). A negative result must be combined with clinical observations, patient history, and epidemiological information. The expected result is Negative.  Fact Sheet for Patients:  BloggerCourse.com  Fact Sheet for Healthcare Providers:  SeriousBroker.it  This test is no t yet approved or cleared by the [place] FDA and  has been authorized for detection and/or diagnosis of SARS-CoV-2 by FDA under an Emergency Use Authorization (EUA). This EUA will remain  in effect (meaning this test can be used) for the duration of the COVID-19 declaration under Section 564(b)(1) of the Act, 21 U.S.C.section 360bbb-3(b)(1), unless the authorization is terminated  or revoked sooner.       Influenza A by PCR NEGATIVE NEGATIVE Final   Influenza B by PCR NEGATIVE NEGATIVE Final     Comment: (NOTE) The Xpert Xpress SARS-CoV-2/FLU/RSV plus assay is intended as an aid in the diagnosis of influenza from Nasopharyngeal swab specimens and should not be used as a sole basis for treatment. Nasal washings and aspirates are unacceptable for Xpert Xpress SARS-CoV-2/FLU/RSV testing.  Fact Sheet for Patients: BloggerCourse.com  Fact Sheet for Healthcare Providers: SeriousBroker.it  This test is not yet approved or cleared by the [place] FDA and has been authorized for detection and/or diagnosis of SARS-CoV-2 by FDA under an Emergency Use Authorization (EUA). This EUA will remain  in effect (meaning this test can be used) for the duration of the COVID-19 declaration under Section 564(b)(1) of the Act, 21 U.S.C. section 360bbb-3(b)(1), unless the authorization is terminated or revoked.     Resp Syncytial Virus by PCR NEGATIVE NEGATIVE Final    Comment: (NOTE) Fact Sheet for Patients: BloggerCourse.com  Fact Sheet for Healthcare Providers: SeriousBroker.it  This test is not yet approved or cleared by the [place] FDA and has been authorized for detection and/or diagnosis of SARS-CoV-2 by FDA under an Emergency Use Authorization (EUA). This EUA will remain in effect (meaning this test can be used) for the duration of the COVID-19 declaration under Section 564(b)(1) of the Act, 21 U.S.C. section 360bbb-3(b)(1), unless the authorization is terminated or revoked.  Performed at [facility], [address], [place], [place] [address]   Culture, blood (routine x 2)     Status: None   Collection Time: 12/24/22  6:35 PM   Specimen: Left Antecubital; Blood  Result Value Ref Range Status   Specimen Description LEFT ANTECUBITAL  Final   Special Requests NONE  Final   Culture   Final    NO GROWTH 5 DAYS Performed at [facility] [address] [place], [place]  [address]    Report Status 12/29/2022 FINAL  Final  Culture, blood (routine x 2)     Status: None   Collection Time: 12/24/22  8:31 PM   Specimen: Left Antecubital; Blood  Result Value Ref Range Status   Specimen Description LEFT ANTECUBITAL BLOOD  Final   Special Requests   Final    BOTTLES DRAWN AEROBIC ONLY Blood Culture adequate volume   Culture   Final    NO GROWTH 5 DAYS Performed at [facility], [address] [place], [place] [address]    Report Status 12/29/2022 FINAL  Final  Gram stain     Status: None   Collection Time: 12/25/22 10:35 AM   Specimen: Pleura  Result Value Ref Range Status   Specimen Description PLEURAL  Final   Special Requests NONE  Final   Gram Stain   Final    NO ORGANISMS SEEN WBC PRESENT,BOTH PMN AND MONONUCLEAR Performed at [facility], [address] [place], [place] [address]    Report Status 12/25/2022 FINAL  Final  Culture, body fluid w Gram Stain-bottle     Status: None   Collection Time: 12/25/22 10:35 AM   Specimen: Pleura  Result Value Ref Range Status   Specimen Description PLEURAL  Final   Special Requests BOTTLES DRAWN AEROBIC AND ANAEROBIC 10CC  Final   Culture   Final    NO GROWTH 5 DAYS Performed at [facility], [address], [place], [place] [address]    Report Status 12/30/2022 FINAL  Final  Body fluid culture w Gram Stain     Status: None (Preliminary result)   Collection Time: 12/29/22  2:38 PM   Specimen: Pleural Fluid  Result Value Ref Range Status   Specimen Description PLEURAL  Final   Special Requests NONE  Final   Gram Stain   Final    NO ORGANISMS SEEN NO WBC SEEN RED BLOOD CELLS PRESENT Performed at [facility], [address], [place], [place] [address]    Culture PENDING  Incomplete   Report Status PENDING  Incomplete    RADIOLOGY STUDIES/RESULTS: DG CHEST PORT 1 VIEW  Result Date: 12/29/2022 CLINICAL DATA:  Status post left chest tube placement for pleural effusion. EXAM: PORTABLE CHEST 1  VIEW COMPARISON:  12/28/2022 FINDINGS: Stable cardiac enlargement. Stable appearance of Port-A-Cath. Interval placement of pigtail chest tube in the inferolateral left pleural space. Tube projects just into the pleural space. Diminished pleural fluid. No definite pneumothorax. Bilateral lower lobe atelectasis. IMPRESSION: Interval placement of pigtail chest tube in the inferolateral left pleural space with diminished pleural fluid. No definite pneumothorax. Distal portion of the chest tube just projects into the pleural space by x-ray. Electronically Signed   By: Irish Lack M.D.   On: 12/29/2022 16:38     LOS: 6 days   Jeoffrey Massed, MD  [facility]    To contact the attending provider between 7A-7P or the covering provider during after hours 7P-7A, please log into the web site [email or web address] and access using universal [place] password for that web site. If you do not have the password, please call the hospital operator.  12/30/2022, 9:50 AM

## 2022-12-30 NOTE — Plan of Care (Signed)
  Problem: Fluid Volume: Goal: Hemodynamic stability will improve Outcome: Progressing   Problem: Clinical Measurements: Goal: Diagnostic test results will improve Outcome: Progressing Goal: Signs and symptoms of infection will decrease Outcome: Progressing   Problem: Respiratory: Goal: Ability to maintain adequate ventilation will improve Outcome: Progressing   Problem: Education: Goal: Knowledge of General Education information will improve Description: Including pain rating scale, medication(s)/side effects and non-pharmacologic comfort measures Outcome: Progressing   Problem: Health Behavior/Discharge Planning: Goal: Ability to manage health-related needs will improve Outcome: Progressing   Problem: Clinical Measurements: Goal: Ability to maintain clinical measurements within normal limits will improve Outcome: Progressing Goal: Will remain free from infection Outcome: Progressing Goal: Diagnostic test results will improve Outcome: Progressing Goal: Respiratory complications will improve Outcome: Progressing Goal: Cardiovascular complication will be avoided Outcome: Progressing   Problem: Activity: Goal: Risk for activity intolerance will decrease Outcome: Progressing   Problem: Nutrition: Goal: Adequate nutrition will be maintained Outcome: Progressing   Problem: Elimination: Goal: Will not experience complications related to bowel motility Outcome: Progressing Goal: Will not experience complications related to urinary retention Outcome: Progressing   Problem: Safety: Goal: Ability to remain free from injury will improve Outcome: Progressing

## 2022-12-30 NOTE — Progress Notes (Signed)
Mobility Specialist Progress Note:    12/30/22 1149  Mobility  Activity Ambulated with assistance in room  Level of Assistance Contact guard assist, steadying assist  Assistive Device Front wheel walker  Distance Ambulated (ft) 20 ft  Activity Response Tolerated well  Mobility Referral Yes  $Mobility charge 1 Mobility  Mobility Specialist Start Time (ACUTE ONLY) 1105  Mobility Specialist Stop Time (ACUTE ONLY) 1119  Mobility Specialist Time Calculation (min) (ACUTE ONLY) 14 min   Received pt in chair having no complaints and agreeable to mobility. C/o SOB during ambulation SPO2 88% on 4L/min, otherwise no c/o. Returned to chair w/ call bell in reach and all needs met. Left on 3L/min SPO2 94%.    Thompson Grayer Mobility Specialist  Please contact vis Secure Chat or  Rehab Office [phone]

## 2022-12-31 ENCOUNTER — Inpatient Hospital Stay (HOSPITAL_COMMUNITY): Payer: Medicare HMO

## 2022-12-31 DIAGNOSIS — J9601 Acute respiratory failure with hypoxia: Secondary | ICD-10-CM | POA: Diagnosis not present

## 2022-12-31 DIAGNOSIS — J90 Pleural effusion, not elsewhere classified: Secondary | ICD-10-CM | POA: Diagnosis not present

## 2022-12-31 DIAGNOSIS — J189 Pneumonia, unspecified organism: Secondary | ICD-10-CM | POA: Diagnosis not present

## 2022-12-31 LAB — CBC
HCT: 26.3 % — ABNORMAL LOW (ref 36.0–46.0)
Hemoglobin: 7.8 g/dL — ABNORMAL LOW (ref 12.0–15.0)
MCH: 28.9 pg (ref 26.0–34.0)
MCHC: 29.7 g/dL — ABNORMAL LOW (ref 30.0–36.0)
MCV: 97.4 fL (ref 80.0–100.0)
Platelets: 368 10*3/uL (ref 150–400)
RBC: 2.7 MIL/uL — ABNORMAL LOW (ref 3.87–5.11)
RDW: 16.1 % — ABNORMAL HIGH (ref 11.5–15.5)
WBC: 8.8 10*3/uL (ref 4.0–10.5)
nRBC: 0.3 % — ABNORMAL HIGH (ref 0.0–0.2)

## 2022-12-31 LAB — BASIC METABOLIC PANEL
Anion gap: 10 (ref 5–15)
BUN: 10 mg/dL (ref 8–23)
CO2: 28 mmol/L (ref 22–32)
Calcium: 8.6 mg/dL — ABNORMAL LOW (ref 8.9–10.3)
Chloride: 101 mmol/L (ref 98–111)
Creatinine, Ser: 0.89 mg/dL (ref 0.44–1.00)
GFR, Estimated: >60 mL/min (ref >60)
Glucose, Bld: 105 mg/dL — ABNORMAL HIGH (ref 70–99)
Potassium: 3.6 mmol/L (ref 3.5–5.1)
Sodium: 139 mmol/L (ref 135–145)

## 2022-12-31 LAB — MAGNESIUM: Magnesium: 1.6 mg/dL — ABNORMAL LOW (ref 1.7–2.4)

## 2022-12-31 LAB — HEPARIN LEVEL (UNFRACTIONATED)
Heparin Unfractionated: 1.03 [IU]/mL — ABNORMAL HIGH (ref 0.30–0.70)
Heparin Unfractionated: 0.95 [IU]/mL — ABNORMAL HIGH (ref 0.30–0.70)

## 2022-12-31 LAB — APTT
aPTT: 51 s — ABNORMAL HIGH (ref 24–36)
aPTT: 59 s — ABNORMAL HIGH (ref 24–36)
aPTT: 101 s — ABNORMAL HIGH (ref 24–36)

## 2022-12-31 LAB — GLUCOSE, CAPILLARY: Glucose-Capillary: 92 mg/dL (ref 70–99)

## 2022-12-31 MED ORDER — MAGNESIUM SULFATE 4 GM/100ML IV SOLN
4.0000 g | Freq: Once | INTRAVENOUS | Status: AC
  Administered 2022-12-31: 4 g via INTRAVENOUS
  Filled 2022-12-31: qty 100

## 2022-12-31 NOTE — Progress Notes (Signed)
PHARMACY - ANTICOAGULATION CONSULT NOTE  Pharmacy Consult for heparin Indication: atrial fibrillation  Labs: Recent Labs    12/29/22 0816 12/31/22 0218 12/31/22 1154 12/31/22 2246  HGB 7.8* 7.8*  --   --   HCT 26.2* 26.3*  --   --   PLT 355 368  --   --   APTT  --  51* 59* 101*  HEPARINUNFRC  --  1.03*  --  0.95*  CREATININE 0.94 0.89  --   --     Assessment: 72yo female therapeutic on heparin after rate change but now at upper end of goal; no infusion issues or signs of bleeding per RN.  Goal of Therapy:  aPTT 66-102 seconds   Plan:  Decrease heparin infusion slightly to 1300 units/hr. Monitor daily heparin level and CBC.   Vernard Gambles, PharmD, BCPS 12/31/2022 11:45 PM

## 2022-12-31 NOTE — Progress Notes (Addendum)
PHARMACY - ANTICOAGULATION CONSULT NOTE  Pharmacy Consult for heparin Indication: atrial fibrillation  Labs: Recent Labs    12/29/22 0816 12/31/22 0218  HGB 7.8* 7.8*  HCT 26.2* 26.3*  PLT 355 368  APTT  --  51*  CREATININE 0.94 0.89    Assessment: 72yo female subtherapeutic on heparin with initial dosing while apixaban on hold; no infusion issues or signs of bleeding per RN.  Goal of Therapy:  aPTT 66-102 seconds   Plan:  Increase heparin infusion by 2 units/kg/hr to 1200 units/hr. Check PTT in 6 hours.   Vernard Gambles, PharmD, BCPS 12/31/2022 3:10 AM

## 2022-12-31 NOTE — Progress Notes (Signed)
Mobility Specialist Progress Note:   12/31/22 1400  Mobility  Activity Ambulated with assistance in hallway  Level of Assistance Standby assist, set-up cues, supervision of patient - no hands on  Assistive Device Front wheel walker  Distance Ambulated (ft) 50 ft  Activity Response Tolerated well  Mobility Referral Yes  $Mobility charge 1 Mobility  Mobility Specialist Start Time (ACUTE ONLY) 1417  Mobility Specialist Stop Time (ACUTE ONLY) 1440  Mobility Specialist Time Calculation (min) (ACUTE ONLY) 23 min    Pt received in chair, agreeable to mobility. Requested to use [facility] before ambulation. Void successful. C/o some SOB throughout session. Took x1 seated break and encouraged pursed lipped breathing. VSS throughout. Pt left in chair with call bell and all needs met.  Denise [name] Mobility Specialist Please contact via Special educational needs teacher or Rehab office at [phone]

## 2022-12-31 NOTE — Plan of Care (Signed)

## 2022-12-31 NOTE — Progress Notes (Signed)
PROGRESS NOTE        PATIENT DETAILS Name: Denise [name] Age: 72 y.o. Sex: female Date of Birth: 1950-10-27 Admit Date: 12/24/2022 Admitting Physician Ejiroghene Wendall Stade, MD ZOX:WRUE, Earvin Hansen, MD  Brief Summary: Patient is a 72 y.o.  female with history of metastatic/stage IV adenocarcinoma of the lung, chronic toxic respiratory failure on 2 L of oxygen, PE, DM-2, HTN, aortic stenosis-who was admitted to [facility] on 10/6 with shortness of breath-for her to have large left-sided pleural effusion-underwent thoracocentesis-workup was suggestive of an exudate-and transferred to [facility] for pulmonary evaluation.  Significant events: 10/6>> admit to [facility]  Significant studies: 10/6>> CTA chest: No PE, extensive symmetric groundglass/consolidative changes-large left pleural effusion.  Stay able enhancing lesions in the liver bilaterally. 10/7>> echo: EF 65-70%  Significant microbiology/pathology data: 10/6>> COVID/influenza/RSV PCR: Negative 10/6>> blood culture: No growth 10/7>> pleural fluid culture: No growth 10/7>> pleural fluid cytology: No malignancy  Procedures: 10/7>> ultrasound-guided thoracocentesis (WBC 1332-60% neutrophils, LDH 278) 10/11>> chest tube placement by PCCM  Consults: PCCM  Subjective: No complaints-sitting at bedside chair.  Objective: Vitals: Blood pressure 105/62, pulse 66, temperature 98 F (36.7 C), temperature source Oral, resp. rate 15, height 5\' 2"  (1.575 m), weight 99.8 kg, SpO2 95%.   Exam: Gen Exam:Alert awake-not in any distress HEENT:atraumatic, normocephalic Chest: B/L clear to auscultation anteriorly CVS:S1S2 regular Abdomen:soft non tender, non distended Extremities:no edema Neurology: Non focal Skin: no rash  Pertinent Labs/Radiology:    Latest Ref Rng & Units 12/31/2022    2:18 AM 12/29/2022    8:16 AM 12/26/2022    8:55 PM  CBC  WBC 4.0 - 10.5 K/uL 8.8  9.6  9.8   Hemoglobin 12.0 - 15.0 g/dL 7.8   7.8  8.2   Hematocrit 36.0 - 46.0 % 26.3  26.2  27.2   Platelets 150 - 400 K/uL 368  355  349     Lab Results  Component Value Date   NA 139 12/31/2022   K 3.6 12/31/2022   CL 101 12/31/2022   CO2 28 12/31/2022     Assessment/Plan: Severe sepsis secondary to multifocal pneumonia with possible parapneumonic effusion Acute on chronic hypoxic respiratory failure secondary to PNA-pleural effusion Hypoxia is gradually improved with supportive care/thoracocentesis/chest tube tube placement/antibiotics Continue Rocephin x 7 days  Exudative pleural effusion Unclear whether this is parapneumonic or related to malignancy Pleural fluid cultures and cytology negative so far PCCM placed chest tube on 10/11-with tentative plans to start lytic therapy-however unfortunately chest tube was dislodged on 10/12 Currently being observed-awaiting repeat x-ray and PCCM follow-up today.  Per PCCM-May need chest tube to be reinserted. Empirically covered with Rocephin for possible parapneumonic effusion.  PAF with RVR Sign rhythm Continue Coreg Cardizem discontinued on 10/10 Eliquis held for chest tube placement-currently on IV heparin  HTN BP on the soft side-per patient this is chronic Continue Coreg Continue midodrine Follow/optimize.  Hypomagnesemia Replete/recheck  Chronic HFpEF Euvolemic BPs soft but stable-midodrine dosage escalated 10/9 Beta-blocker/Lasix  Aortic stenosis chronic issue. Stable for outpatient follow-up  Normocytic anemia Likely due to underlying malignancy No evidence of blood loss Follow CBC  Metastatic adenocarcinoma of the lung Follow-up with oncology in the outpatient setting  History of unprovoked pulmonary embolism Per prior notes-needs indefinite anticoagulation-remains on Eliquis  Morbid Obesity: Estimated body mass index is 40.24 kg/m as calculated from the  following:   Height as of this encounter: 5\' 2"  (1.575 m).   Weight as of this  encounter: 99.8 kg.   Code status:   Code Status: Full Code   DVT Prophylaxis: SCDs Start: 12/24/22 2326    Family Communication: None at bedside   Disposition Plan: Status is: Inpatient Remains inpatient appropriate because: Severity of illness   Planned Discharge Destination:Home   Diet: Diet Order             Diet Heart Room service appropriate? Yes; Fluid consistency: Thin  Diet effective now                     Antimicrobial agents: Anti-infectives (From admission, onward)    Start     Dose/Rate Route Frequency Ordered Stop   12/30/22 2000  cefTRIAXone (ROCEPHIN) 2 g in sodium chloride 0.9 % 100 mL IVPB        2 g 200 mL/hr over 30 Minutes Intravenous Every 24 hours 12/30/22 0951 01/01/23 1959   12/25/22 1900  cefTRIAXone (ROCEPHIN) 2 g in sodium chloride 0.9 % 100 mL IVPB        2 g 200 mL/hr over 30 Minutes Intravenous Every 24 hours 12/24/22 2325 12/29/22 2053   12/25/22 1900  azithromycin (ZITHROMAX) 500 mg in sodium chloride 0.9 % 250 mL IVPB        500 mg 250 mL/hr over 60 Minutes Intravenous Every 24 hours 12/24/22 2325 12/29/22 2125   12/24/22 1900  cefTRIAXone (ROCEPHIN) 2 g in sodium chloride 0.9 % 100 mL IVPB        2 g 200 mL/hr over 30 Minutes Intravenous  Once 12/24/22 1847 12/24/22 2013   12/24/22 1900  azithromycin (ZITHROMAX) tablet 500 mg        500 mg Oral  Once 12/24/22 1847 12/24/22 1930        MEDICATIONS: Scheduled Meds:  carvedilol  3.125 mg Oral BID WC   Chlorhexidine Gluconate Cloth  6 each Topical Daily   dextromethorphan-guaiFENesin  1 tablet Oral BID   furosemide  40 mg Oral Daily   lidocaine  1 patch Transdermal Q24H   midodrine  10 mg Oral TID WC   pantoprazole  40 mg Oral Daily   sodium chloride flush  10 mL Intrapleural Q8H   Continuous Infusions:  cefTRIAXone (ROCEPHIN)  IV 2 g (12/30/22 2048)   heparin 1,200 Units/hr (12/31/22 0311)   magnesium sulfate bolus IVPB      PRN Meds:.acetaminophen **OR**  acetaminophen, albuterol, guaiFENesin-dextromethorphan, metoprolol tartrate, ondansetron **OR** ondansetron (ZOFRAN) IV, mouth rinse, oxyCODONE, polyethylene glycol, sodium chloride flush   I have personally reviewed following labs and imaging studies  LABORATORY DATA: CBC: Recent Labs  Lab 12/24/22 1657 12/25/22 0314 12/26/22 2055 12/29/22 0816 12/31/22 0218  WBC 10.2 9.6 9.8 9.6 8.8  NEUTROABS 8.2*  --   --   --   --   HGB 9.5* 8.7* 8.2* 7.8* 7.8*  HCT 31.9* 29.0* 27.2* 26.2* 26.3*  MCV 100.0 100.0 100.0 98.9 97.4  PLT 463* 391 349 355 368    Basic Metabolic Panel: Recent Labs  Lab 12/24/22 1657 12/24/22 2031 12/25/22 0314 12/26/22 2055 12/29/22 0816 12/31/22 0218  NA 139  --  144 141 144 139  K 3.4*  --  4.3 4.0 3.3* 3.6  CL 104  --  108 105 103 101  CO2 24  --  26 28 25 28   GLUCOSE 164*  --  125* 121* 98 105*  BUN 10  --  10 9 11 10   CREATININE 1.00  --  0.88 0.96 0.94 0.89  CALCIUM 8.8*  --  8.7* 8.8* 8.8* 8.6*  MG  --  1.7  --   --   --  1.6*    GFR: Estimated Creatinine Clearance: 64.1 mL/min (by C-G formula based on SCr of 0.89 mg/dL).  Liver Function Tests: Recent Labs  Lab 12/24/22 1657 12/26/22 2055  AST 17 17  ALT 13 12  ALKPHOS 97 80  BILITOT 0.6 0.4  PROT 6.8 5.7*  ALBUMIN 2.7* 2.0*   No results for input(s): "LIPASE", "AMYLASE" in the last 168 hours. No results for input(s): "AMMONIA" in the last 168 hours.  Coagulation Profile: No results for input(s): "INR", "PROTIME" in the last 168 hours.  Cardiac Enzymes: No results for input(s): "CKTOTAL", "CKMB", "CKMBINDEX", "TROPONINI" in the last 168 hours.  BNP (last 3 results) No results for input(s): "PROBNP" in the last 8760 hours.  Lipid Profile: No results for input(s): "CHOL", "HDL", "LDLCALC", "TRIG", "CHOLHDL", "LDLDIRECT" in the last 72 hours.  Thyroid Function Tests: No results for input(s): "TSH", "T4TOTAL", "FREET4", "T3FREE", "THYROIDAB" in the last 72 hours.   Anemia  Panel: No results for input(s): "VITAMINB12", "FOLATE", "FERRITIN", "TIBC", "IRON", "RETICCTPCT" in the last 72 hours.  Urine analysis:    Component Value Date/Time   COLORURINE YELLOW 09/18/2022 1355   APPEARANCEUR CLEAR 09/18/2022 1355   LABSPEC 1.029 09/18/2022 1355   PHURINE 5.0 09/18/2022 1355   GLUCOSEU NEGATIVE 09/18/2022 1355   HGBUR NEGATIVE 09/18/2022 1355   BILIRUBINUR MODERATE (A) 09/18/2022 1355   KETONESUR NEGATIVE 09/18/2022 1355   PROTEINUR 30 (A) 09/18/2022 1355   UROBILINOGEN 1.0 03/05/2012 1200   NITRITE NEGATIVE 09/18/2022 1355   LEUKOCYTESUR NEGATIVE 09/18/2022 1355    Sepsis Labs: Lactic Acid, Venous    Component Value Date/Time   LATICACIDVEN 0.7 12/24/2022 1835    MICROBIOLOGY: Recent Results (from the past 240 hour(s))  Resp panel by RT-PCR (RSV, Flu A&B, Covid) Anterior Nasal Swab     Status: None   Collection Time: 12/24/22  4:01 PM   Specimen: Anterior Nasal Swab  Result Value Ref Range Status   SARS Coronavirus 2 by RT PCR NEGATIVE NEGATIVE Final    Comment: (NOTE) SARS-CoV-2 target nucleic acids are NOT DETECTED.  The SARS-CoV-2 RNA is generally detectable in upper respiratory specimens during the acute phase of infection. The lowest concentration of SARS-CoV-2 viral copies this assay can detect is 138 copies/mL. A negative result does not preclude SARS-Cov-2 infection and should not be used as the sole basis for treatment or other patient management decisions. A negative result may occur with  improper specimen collection/handling, submission of specimen other than nasopharyngeal swab, presence of viral mutation(s) within the areas targeted by this assay, and inadequate number of viral copies(<138 copies/mL). A negative result must be combined with clinical observations, patient history, and epidemiological information. The expected result is Negative.  Fact Sheet for Patients:  BloggerCourse.com  Fact Sheet  for Healthcare Providers:  SeriousBroker.it  This test is no t yet approved or cleared by the [place] FDA and  has been authorized for detection and/or diagnosis of SARS-CoV-2 by FDA under an Emergency Use Authorization (EUA). This EUA will remain  in effect (meaning this test can be used) for the duration of the COVID-19 declaration under Section 564(b)(1) of the Act, 21 U.S.C.section 360bbb-3(b)(1), unless the authorization is terminated  or revoked sooner.  Influenza A by PCR NEGATIVE NEGATIVE Final   Influenza B by PCR NEGATIVE NEGATIVE Final    Comment: (NOTE) The Xpert Xpress SARS-CoV-2/FLU/RSV plus assay is intended as an aid in the diagnosis of influenza from Nasopharyngeal swab specimens and should not be used as a sole basis for treatment. Nasal washings and aspirates are unacceptable for Xpert Xpress SARS-CoV-2/FLU/RSV testing.  Fact Sheet for Patients: BloggerCourse.com  Fact Sheet for Healthcare Providers: SeriousBroker.it  This test is not yet approved or cleared by the [place] FDA and has been authorized for detection and/or diagnosis of SARS-CoV-2 by FDA under an Emergency Use Authorization (EUA). This EUA will remain in effect (meaning this test can be used) for the duration of the COVID-19 declaration under Section 564(b)(1) of the Act, 21 U.S.C. section 360bbb-3(b)(1), unless the authorization is terminated or revoked.     Resp Syncytial Virus by PCR NEGATIVE NEGATIVE Final    Comment: (NOTE) Fact Sheet for Patients: BloggerCourse.com  Fact Sheet for Healthcare Providers: SeriousBroker.it  This test is not yet approved or cleared by the [place] FDA and has been authorized for detection and/or diagnosis of SARS-CoV-2 by FDA under an Emergency Use Authorization (EUA). This EUA will remain in effect (meaning  this test can be used) for the duration of the COVID-19 declaration under Section 564(b)(1) of the Act, 21 U.S.C. section 360bbb-3(b)(1), unless the authorization is terminated or revoked.  Performed at [facility], [address], [place], [place] [address]   Culture, blood (routine x 2)     Status: None   Collection Time: 12/24/22  6:35 PM   Specimen: Left Antecubital; Blood  Result Value Ref Range Status   Specimen Description LEFT ANTECUBITAL  Final   Special Requests NONE  Final   Culture   Final    NO GROWTH 5 DAYS Performed at [facility], [address], [place], [place] [address]    Report Status 12/29/2022 FINAL  Final  Culture, blood (routine x 2)     Status: None   Collection Time: 12/24/22  8:31 PM   Specimen: Left Antecubital; Blood  Result Value Ref Range Status   Specimen Description LEFT ANTECUBITAL BLOOD  Final   Special Requests   Final    BOTTLES DRAWN AEROBIC ONLY Blood Culture adequate volume   Culture   Final    NO GROWTH 5 DAYS Performed at [facility], [address], [place], [place] [address]    Report Status 12/29/2022 FINAL  Final  Gram stain     Status: None   Collection Time: 12/25/22 10:35 AM   Specimen: Pleura  Result Value Ref Range Status   Specimen Description PLEURAL  Final   Special Requests NONE  Final   Gram Stain   Final    NO ORGANISMS SEEN WBC PRESENT,BOTH PMN AND MONONUCLEAR Performed at [facility], [address], [place], [place] [address]    Report Status 12/25/2022 FINAL  Final  Culture, body fluid w Gram Stain-bottle     Status: None   Collection Time: 12/25/22 10:35 AM   Specimen: Pleura  Result Value Ref Range Status   Specimen Description PLEURAL  Final   Special Requests BOTTLES DRAWN AEROBIC AND ANAEROBIC 10CC  Final   Culture   Final    NO GROWTH 5 DAYS Performed at [facility], [address] [place], [place] [address]    Report Status 12/30/2022 FINAL  Final  Body fluid culture w Gram Stain     Status:  None (Preliminary result)   Collection Time: 12/29/22  2:38 PM   Specimen: Pleural Fluid  Result Value Ref Range Status   Specimen Description PLEURAL  Final   Special Requests NONE  Final   Gram Stain   Final    NO ORGANISMS SEEN NO WBC SEEN RED BLOOD CELLS PRESENT    Culture   Final    NO GROWTH < 24 HOURS Performed at [facility], [address], [place], [place] [address]    Report Status PENDING  Incomplete    RADIOLOGY STUDIES/RESULTS: DG CHEST PORT 1 VIEW  Result Date: 12/30/2022 CLINICAL DATA:  Chest tube in place. EXAM: PORTABLE CHEST 1 VIEW COMPARISON:  One-view chest x-ray 12/28/2022. FINDINGS: A right IJ Port-A-Cath is in place. The heart is enlarged. Atherosclerotic calcifications are present at the aortic arch. Left greater than right pleural effusions are present. A small bore catheter is outside the chest cavity. No pneumothorax is present. IMPRESSION: 1. Left greater than right pleural effusions. 2. Cardiomegaly without failure. 3. Small bore catheter is outside the chest cavity. 4. Right IJ Port-A-Cath. Critical Value/emergent results were called by telephone at the time of interpretation on 12/30/2022 at 2:43 pm to provider Dr. Irma Newness , who verbally acknowledged these results. Electronically Signed   By: Marin Roberts M.D.   On: 12/30/2022 14:44   DG CHEST PORT 1 VIEW  Result Date: 12/29/2022 CLINICAL DATA:  Status post left chest tube placement for pleural effusion. EXAM: PORTABLE CHEST 1 VIEW COMPARISON:  12/28/2022 FINDINGS: Stable cardiac enlargement. Stable appearance of Port-A-Cath. Interval placement of pigtail chest tube in the inferolateral left pleural space. Tube projects just into the pleural space. Diminished pleural fluid. No definite pneumothorax. Bilateral lower lobe atelectasis. IMPRESSION: Interval placement of pigtail chest tube in the inferolateral left pleural space with diminished pleural fluid. No definite pneumothorax. Distal  portion of the chest tube just projects into the pleural space by x-ray. Electronically Signed   By: Irish Lack M.D.   On: 12/29/2022 16:38     LOS: 7 days   Jeoffrey Massed, MD  [facility]    To contact the attending provider between 7A-7P or the covering provider during after hours 7P-7A, please log into the web site [email or web address] and access using universal  password for that web site. If you do not have the password, please call the hospital operator.  12/31/2022, 8:40 AM

## 2022-12-31 NOTE — Progress Notes (Signed)
NAME:  Denise [name], MRN:  [id number], DOB:  November 23, 1950, LOS: 7 ADMISSION DATE:  12/24/2022, CONSULTATION DATE:  12/26/2022 REFERRING MD:  Odis Luster MD, CHIEF COMPLAINT: Pleural effusion  History of Present Illness:   72 year old with history of diabetes, PE, aortic stenosis, hypertension, stage III lung cancer on chemoradiation presenting with sepsis, acute on chronic respiratory failure, hypoxia and left effusion.  She underwent ultrasound-guided thoracentesis on 10/7 with interventional radiology yielding 850 mL of pleural fluid  Pertinent  Medical History    has a past medical history of Anemia, Aortic stenosis, Arthritis, Coronary artery calcification seen on CT scan, Dyspnea, Essential hypertension, GERD (gastroesophageal reflux disease), History of lung cancer, Port-A-Cath in place (01/06/2019), and Type 2 diabetes mellitus (HCC).   Significant Hospital Events: Including procedures, antibiotic start and stop dates in addition to other pertinent events   10/11 Left chest tube placed 10/12 Left chest tube dislodged  Interim History / Subjective:  Left chest tube dislodged ~550cc chest tube output Reports able to breathe easier Denies any ongoing symptoms at present  Objective   Blood pressure 108/63, pulse 67, temperature 98 F (36.7 C), resp. rate (!) 24, height 5\' 2"  (1.575 m), weight 99.8 kg, SpO2 100%.        Intake/Output Summary (Last 24 hours) at 12/31/2022 1627 Last data filed at 12/31/2022 1150 Gross per 24 hour  Intake 1316.28 ml  Output 410 ml  Net 906.28 ml   Filed Weights   12/30/22 1500  Weight: 99.8 kg     Physical Exam: General: Elderly lady, does not appear to be in distress HENT: Moist oral mucosa Eyes: Anicteric Respiratory: Decreased air entry bibasilarly Cardiovascular: S1-S2 appreciated GI: BS+, soft, nontender Extremities:-No edema, no tenderness Neuro: Grossly intact   Resolved Hospital Problem list     Assessment & Plan:  Acute on  chronic respiratory failure with hypoxia Exudative pleural effusion likely secondary to pneumonia, concern for malignancy but pleural fluid negative History of lung cancer History of PE in the past status post recent thoracentesis  Chest tube was placed 10/11, dislodged 10/12 after draining about 500 cc  I think is appropriate for [place] to repeat a CT scan of the chest to assess for residual loculation and possible replacement of chest tube with drainage,  Completed treatment for community-acquired pneumonia with antibiotics, azithromycin and ceftriaxone  On heparin for atrial fibrillation  I did review new chest x-ray which shows some improvement in findings but previous CT had revealed possible loculated fluid anteriorly  This was fully discussed with patient -Agreeable to chest tube if there is significant fluid that is still persistent  Virl Diamond, MD [facility] Pager: See Loretha Stapler

## 2022-12-31 NOTE — Progress Notes (Addendum)
PHARMACY - ANTICOAGULATION CONSULT NOTE  Pharmacy Consult for heparin Indication: atrial fibrillation  No Known Allergies  Patient Measurements: Height: 5\' 2"  (157.5 cm) Weight: 99.8 kg (220 lb) (from 12/20/22) IBW/kg (Calculated) : 50.1 Heparin Dosing Weight: 73.8 kg  Vital Signs: Temp: 98.2 F (36.8 C) (10/13 0900) Temp Source: Oral (10/13 0825) BP: 101/56 (10/13 0900) Pulse Rate: 80 (10/13 0900)  Labs: Recent Labs    12/29/22 0816 12/31/22 0218 12/31/22 1154  HGB 7.8* 7.8*  --   HCT 26.2* 26.3*  --   PLT 355 368  --   APTT  --  51* 59*  HEPARINUNFRC  --  1.03*  --   CREATININE 0.94 0.89  --     Estimated Creatinine Clearance: 64.1 mL/min (by C-G formula based on SCr of 0.89 mg/dL).   Medical History: Past Medical History:  Diagnosis Date   Anemia    Aortic stenosis    Arthritis    Coronary artery calcification seen on CT scan    Dyspnea    Essential hypertension    GERD (gastroesophageal reflux disease)    History of lung cancer    Stage III adenocarcinoma status post chemoradiation   Port-A-Cath in place 01/06/2019   Type 2 diabetes mellitus (HCC)    Assessment: 72YOF with PMH of metastatic/stage IV adenocarcinoma of the lung, chronic toxic respiratory failure on 2 L of oxygen, PE, DM-2, HTN, aortic stenosis now with new onset afib with RVR. Started on Eliquis during admission and held d/t chest tube placement 10/11. (last dose Eliquis 10/10 ~9AM). Chest tube dislodged, then removed 10/12. Pharmacy consulted to dose heparin.   PTT came back subtherapeutic again. Levels have had a lot of fluctuation. Hgb 7.6, plt wnl  Goal of Therapy:  Heparin level 0.3-0.7 units/ml aPTT 66-102 seconds Monitor platelets by anticoagulation protocol: Yes   Plan:  Increase heparin infusion to 1350 units/hr Check APTT/anti-Xa level in 8 hours and daily while on heparin Continue to monitor CBC and signs/symptoms of bleeding F/u chest tube replacement  Ulyses Southward,  PharmD, BCIDP, AAHIVP, CPP Infectious Disease Pharmacist 01/01/2023 7:27 AM

## 2023-01-01 ENCOUNTER — Inpatient Hospital Stay (HOSPITAL_COMMUNITY): Payer: Medicare HMO

## 2023-01-01 ENCOUNTER — Telehealth (INDEPENDENT_AMBULATORY_CARE_PROVIDER_SITE_OTHER): Payer: Medicare HMO | Admitting: Gastroenterology

## 2023-01-01 DIAGNOSIS — J9601 Acute respiratory failure with hypoxia: Secondary | ICD-10-CM | POA: Diagnosis not present

## 2023-01-01 DIAGNOSIS — J9621 Acute and chronic respiratory failure with hypoxia: Secondary | ICD-10-CM | POA: Diagnosis not present

## 2023-01-01 DIAGNOSIS — J189 Pneumonia, unspecified organism: Secondary | ICD-10-CM | POA: Diagnosis not present

## 2023-01-01 DIAGNOSIS — J90 Pleural effusion, not elsewhere classified: Secondary | ICD-10-CM | POA: Diagnosis not present

## 2023-01-01 LAB — BASIC METABOLIC PANEL
Anion gap: 14 (ref 5–15)
BUN: 9 mg/dL (ref 8–23)
CO2: 29 mmol/L (ref 22–32)
Calcium: 8.8 mg/dL — ABNORMAL LOW (ref 8.9–10.3)
Chloride: 99 mmol/L (ref 98–111)
Creatinine, Ser: 1.2 mg/dL — ABNORMAL HIGH (ref 0.44–1.00)
GFR, Estimated: 48 mL/min — ABNORMAL LOW (ref >60)
Glucose, Bld: 105 mg/dL — ABNORMAL HIGH (ref 70–99)
Potassium: 3.5 mmol/L (ref 3.5–5.1)
Sodium: 142 mmol/L (ref 135–145)

## 2023-01-01 LAB — MAGNESIUM: Magnesium: 1.9 mg/dL (ref 1.7–2.4)

## 2023-01-01 LAB — CBC
HCT: 25.9 % — ABNORMAL LOW (ref 36.0–46.0)
Hemoglobin: 7.6 g/dL — ABNORMAL LOW (ref 12.0–15.0)
MCH: 29.2 pg (ref 26.0–34.0)
MCHC: 29.3 g/dL — ABNORMAL LOW (ref 30.0–36.0)
MCV: 99.6 fL (ref 80.0–100.0)
Platelets: 403 10*3/uL — ABNORMAL HIGH (ref 150–400)
RBC: 2.6 MIL/uL — ABNORMAL LOW (ref 3.87–5.11)
RDW: 16 % — ABNORMAL HIGH (ref 11.5–15.5)
WBC: 8 10*3/uL (ref 4.0–10.5)
nRBC: 0 % (ref 0.0–0.2)

## 2023-01-01 LAB — HEPARIN LEVEL (UNFRACTIONATED)
Heparin Unfractionated: 0.84 [IU]/mL — ABNORMAL HIGH (ref 0.30–0.70)
Heparin Unfractionated: 0.75 [IU]/mL — ABNORMAL HIGH (ref 0.30–0.70)

## 2023-01-01 LAB — GLUCOSE, CAPILLARY: Glucose-Capillary: 116 mg/dL — ABNORMAL HIGH (ref 70–99)

## 2023-01-01 LAB — APTT
aPTT: 52 s — ABNORMAL HIGH (ref 24–36)
aPTT: 60 s — ABNORMAL HIGH (ref 24–36)

## 2023-01-01 MED ORDER — IOHEXOL 350 MG/ML SOLN
50.0000 mL | Freq: Once | INTRAVENOUS | Status: AC | PRN
  Administered 2023-01-01: 50 mL via INTRAVENOUS

## 2023-01-01 MED ORDER — FUROSEMIDE 40 MG PO TABS
40.0000 mg | ORAL_TABLET | Freq: Every day | ORAL | Status: DC
  Administered 2023-01-02: 40 mg via ORAL
  Filled 2023-01-01: qty 1

## 2023-01-01 NOTE — Plan of Care (Signed)
  Problem: Fluid Volume: Goal: Hemodynamic stability will improve Outcome: Progressing   Problem: Clinical Measurements: Goal: Diagnostic test results will improve Outcome: Progressing Goal: Signs and symptoms of infection will decrease Outcome: Progressing   Problem: Respiratory: Goal: Ability to maintain adequate ventilation will improve Outcome: Progressing   Problem: Health Behavior/Discharge Planning: Goal: Ability to manage health-related needs will improve Outcome: Progressing   Problem: Clinical Measurements: Goal: Respiratory complications will improve Outcome: Progressing Goal: Cardiovascular complication will be avoided Outcome: Progressing   Problem: Activity: Goal: Risk for activity intolerance will decrease Outcome: Progressing   Problem: Nutrition: Goal: Adequate nutrition will be maintained Outcome: Progressing

## 2023-01-01 NOTE — Plan of Care (Signed)
  Problem: Fluid Volume: Goal: Hemodynamic stability will improve Outcome: Progressing   Problem: Clinical Measurements: Goal: Diagnostic test results will improve Outcome: Progressing Goal: Signs and symptoms of infection will decrease Outcome: Progressing   Problem: Respiratory: Goal: Ability to maintain adequate ventilation will improve Outcome: Progressing   Problem: Education: Goal: Knowledge of General Education information will improve Description: Including pain rating scale, medication(s)/side effects and non-pharmacologic comfort measures Outcome: Progressing   Problem: Clinical Measurements: Goal: Ability to maintain clinical measurements within normal limits will improve Outcome: Progressing Goal: Will remain free from infection Outcome: Progressing Goal: Respiratory complications will improve Outcome: Progressing   Problem: Activity: Goal: Risk for activity intolerance will decrease Outcome: Progressing   Problem: Coping: Goal: Level of anxiety will decrease Outcome: Progressing   Problem: Safety: Goal: Ability to remain free from injury will improve Outcome: Progressing

## 2023-01-01 NOTE — Progress Notes (Signed)
PHARMACY - ANTICOAGULATION CONSULT NOTE  Pharmacy Consult for heparin Indication: atrial fibrillation  Labs: Recent Labs    12/31/22 0218 12/31/22 1154 12/31/22 2246 01/01/23 0436 01/01/23 1812  HGB 7.8*  --   --  7.6*  --   HCT 26.3*  --   --  25.9*  --   PLT 368  --   --  403*  --   APTT 51*   < > 101* 52* 60*  HEPARINUNFRC 1.03*  --  0.95* 0.84* 0.75*  CREATININE 0.89  --   --  1.20*  --    < > = values in this interval not displayed.    Assessment: 72yo female continues on heparin PTT 60 seconds  Goal of Therapy:  aPTT 66-102 seconds   Plan:  Increase heparin to 1400 units / hr Follow up AM labs  Thank you. Okey Regal, PharmD 01/01/2023 7:19 PM

## 2023-01-01 NOTE — Progress Notes (Addendum)
Heparin level and am labs to be obtained via lab draw. Notified 5W phlebotomist.

## 2023-01-01 NOTE — Progress Notes (Signed)
NAME:  Denise [name], MRN:  [id number], DOB:  Sep 30, 1950, LOS: 8 ADMISSION DATE:  12/24/2022, CONSULTATION DATE:  12/26/2022 REFERRING MD:  Odis Luster MD, CHIEF COMPLAINT: Pleural effusion  History of Present Illness:   72 year old with history of diabetes, PE, aortic stenosis, hypertension, stage III lung cancer on chemoradiation presenting with sepsis, acute on chronic respiratory failure, hypoxia and left effusion.  She underwent ultrasound-guided thoracentesis on 10/7 with interventional radiology yielding 850 mL of pleural fluid  Pertinent  Medical History    has a past medical history of Anemia, Aortic stenosis, Arthritis, Coronary artery calcification seen on CT scan, Dyspnea, Essential hypertension, GERD (gastroesophageal reflux disease), History of lung cancer, Port-A-Cath in place (01/06/2019), and Type 2 diabetes mellitus (HCC).   Significant Hospital Events: Including procedures, antibiotic start and stop dates in addition to other pertinent events   10/11 Left chest tube placed 10/12 Left chest tube dislodged  Interim History / Subjective:  CT shows small bilateral effusions, large L sided infiltrate  Objective   Blood pressure 117/63, pulse 74, temperature 98.6 F (37 C), temperature source Oral, resp. rate 16, height 5\' 2"  (1.575 m), weight 99.8 kg, SpO2 100%.       No intake or output data in the 24 hours ending 01/01/23 1309  Filed Weights   12/30/22 1500  Weight: 99.8 kg     Physical Exam: General: Elderly lady, does not appear to be in distress HENT: Moist oral mucosa Eyes: Anicteric Respiratory: Decreased air entry bibasilarly Cardiovascular: S1-S2 appreciated GI: BS+, soft, nontender Extremities:-No edema, no tenderness Neuro: Grossly intact   Resolved Hospital Problem list     Assessment & Plan:   Parapneumonic pleural effusion: On the left.  Chest tube dislodged after good drainage.  Repeat CT 10/14 demonstrates small bilateral pleural effusions  with significant left-sided infiltrate.  Given size no need for replacement of chest tube. --abx per primary, err on longer duration given extent of infiltrate  Bilateral pleural effusions: --Consider trial of diuresis  PCCM will sign off  Karren Burly, MD See Loretha Stapler

## 2023-01-01 NOTE — Progress Notes (Signed)
PT Cancellation Note  Patient Details Name: Denise [name] MRN: [id number] DOB: March 24, 1950   Cancelled Treatment:    Reason Eval/Treat Not Completed: Other (comment)  Patient adamantly refused Mobility Team stating she had had a lot going on today and needed to rest. Upon entry to room, patient sleeping soundly and did not arouse to voice. PT deferred in anticipation that pt would refuse even if (or especially because) she was awakened.    Jerolyn Center, PT Acute Rehabilitation Services  Office [phone]   Zena Amos 01/01/2023, 3:44 PM

## 2023-01-01 NOTE — Progress Notes (Signed)
PROGRESS NOTE        PATIENT DETAILS Name: Denise [name] Age: 72 y.o. Sex: female Date of Birth: Jun 13, 1950 Admit Date: 12/24/2022 Admitting Physician Ejiroghene Wendall Stade, MD [id number], Earvin Hansen, MD  Brief Summary: Patient is a 72 y.o.  female with history of metastatic/stage IV adenocarcinoma of the lung, chronic toxic respiratory failure on 2 L of oxygen, PE, DM-2, HTN, aortic stenosis-who was admitted to [facility] on 10/6 with shortness of breath-for her to have large left-sided pleural effusion-underwent thoracocentesis-workup was suggestive of an exudate-and transferred to [facility] for pulmonary evaluation.  Significant events: 10/6>> admit to [facility]  Significant studies: 10/6>> CTA chest: No PE, extensive symmetric groundglass/consolidative changes-large left pleural effusion.  Stay able enhancing lesions in the liver bilaterally. 10/7>> echo: EF 65-70%  Significant microbiology/pathology data: 10/6>> COVID/influenza/RSV PCR: Negative 10/6>> blood culture: No growth 10/7>> pleural fluid culture: No growth 10/7>> pleural fluid cytology: No malignancy  Procedures: 10/7>> ultrasound-guided thoracocentesis (WBC 1332-60% neutrophils, LDH 278) 10/11>> chest tube placement by PCCM  Consults: PCCM  Subjective: No complaints sitting at bedside chair-awaiting CT chest this morning.  Objective: Vitals: Blood pressure (!) 105/51, pulse 86, temperature 98.9 F (37.2 C), temperature source Oral, resp. rate (!) 28, height 5\' 2"  (1.575 m), weight 99.8 kg, SpO2 100%.   Exam: Gen Exam:Alert awake-not in any distress HEENT:atraumatic, normocephalic Chest: B/L clear to auscultation anteriorly CVS:S1S2 regular Abdomen:soft non tender, non distended Extremities:+ edema Neurology: Non focal Skin: no rash  Pertinent Labs/Radiology:    Latest Ref Rng & Units 01/01/2023    4:36 AM 12/31/2022    2:18 AM 12/29/2022    8:16 AM  CBC  WBC 4.0 - 10.5 K/uL 8.0   8.8  9.6   Hemoglobin 12.0 - 15.0 g/dL 7.6  7.8  7.8   Hematocrit 36.0 - 46.0 % 25.9  26.3  26.2   Platelets 150 - 400 K/uL 403  368  355     Lab Results  Component Value Date   NA 142 01/01/2023   K 3.5 01/01/2023   CL 99 01/01/2023   CO2 29 01/01/2023     Assessment/Plan: Severe sepsis secondary to multifocal pneumonia with possible parapneumonic effusion Acute on chronic hypoxic respiratory failure secondary to PNA-pleural effusion Hypoxia is gradually improved with supportive care/thoracocentesis/chest tube tube placement/antibiotics Has completed 8 days of Rocephin  Exudative pleural effusion Unclear whether this is parapneumonic or related to malignancy Pleural fluid cultures and cytology negative so far PCCM placed chest tube on 10/11-with tentative plans to start lytic therapy-however unfortunately chest tube was dislodged on 10/12.  PCCM recommending a repeat CT chest which is scheduled for today-which will decide whether patient needs chest tube replaced or not. Has completed a course of Rocephin.  PAF with RVR Sign rhythm Continue Coreg Cardizem discontinued on 10/10 Eliquis held for chest tube placement-currently on IV heparin  HTN BP on the soft side-per patient this is chronic Continue Coreg Continue midodrine Follow/optimize.  Hypomagnesemia Replete/recheck  Chronic HFpEF Euvolemic BPs soft but stable-midodrine dosage escalated 10/9 Beta-blocker/Lasix  Aortic stenosis chronic issue. Stable for outpatient follow-up  Normocytic anemia Likely due to underlying malignancy No evidence of blood loss Follow CBC  Metastatic adenocarcinoma of the lung Follow-up with oncology in the outpatient setting  History of unprovoked pulmonary embolism Per prior notes-needs indefinite anticoagulation-on IV heparin-Eliquis on hold as patient  may require chest tube to be reinserted.  Morbid Obesity: Estimated body mass index is 40.24 kg/m as calculated from the  following:   Height as of this encounter: 5\' 2"  (1.575 m).   Weight as of this encounter: 99.8 kg.   Code status:   Code Status: Full Code   DVT Prophylaxis: SCDs Start: 12/24/22 2326    Family Communication: None at bedside   Disposition Plan: Status is: Inpatient Remains inpatient appropriate because: Severity of illness   Planned Discharge Destination:Home   Diet: Diet Order             Diet Heart Room service appropriate? Yes; Fluid consistency: Thin  Diet effective now                     Antimicrobial agents: Anti-infectives (From admission, onward)    Start     Dose/Rate Route Frequency Ordered Stop   12/30/22 2000  cefTRIAXone (ROCEPHIN) 2 g in sodium chloride 0.9 % 100 mL IVPB        2 g 200 mL/hr over 30 Minutes Intravenous Every 24 hours 12/30/22 0951 12/31/22 2116   12/25/22 1900  cefTRIAXone (ROCEPHIN) 2 g in sodium chloride 0.9 % 100 mL IVPB        2 g 200 mL/hr over 30 Minutes Intravenous Every 24 hours 12/24/22 2325 12/29/22 2053   12/25/22 1900  azithromycin (ZITHROMAX) 500 mg in sodium chloride 0.9 % 250 mL IVPB        500 mg 250 mL/hr over 60 Minutes Intravenous Every 24 hours 12/24/22 2325 12/29/22 2125   12/24/22 1900  cefTRIAXone (ROCEPHIN) 2 g in sodium chloride 0.9 % 100 mL IVPB        2 g 200 mL/hr over 30 Minutes Intravenous  Once 12/24/22 1847 12/24/22 2013   12/24/22 1900  azithromycin (ZITHROMAX) tablet 500 mg        500 mg Oral  Once 12/24/22 1847 12/24/22 1930        MEDICATIONS: Scheduled Meds:  carvedilol  3.125 mg Oral BID WC   Chlorhexidine Gluconate Cloth  6 each Topical Daily   dextromethorphan-guaiFENesin  1 tablet Oral BID   [START ON 01/02/2023] furosemide  40 mg Oral Daily   lidocaine  1 patch Transdermal Q24H   midodrine  10 mg Oral TID WC   pantoprazole  40 mg Oral Daily   sodium chloride flush  10 mL Intrapleural Q8H   Continuous Infusions:  heparin 1,350 Units/hr (01/01/23 0829)    PRN  Meds:.acetaminophen **OR** acetaminophen, albuterol, guaiFENesin-dextromethorphan, metoprolol tartrate, ondansetron **OR** ondansetron (ZOFRAN) IV, mouth rinse, oxyCODONE, polyethylene glycol, sodium chloride flush   I have personally reviewed following labs and imaging studies  LABORATORY DATA: CBC: Recent Labs  Lab 12/26/22 2055 12/29/22 0816 12/31/22 0218 01/01/23 0436  WBC 9.8 9.6 8.8 8.0  HGB 8.2* 7.8* 7.8* 7.6*  HCT 27.2* 26.2* 26.3* 25.9*  MCV 100.0 98.9 97.4 99.6  PLT 349 355 368 403*    Basic Metabolic Panel: Recent Labs  Lab 12/26/22 2055 12/29/22 0816 12/31/22 0218 01/01/23 0436  NA 141 144 139 142  K 4.0 3.3* 3.6 3.5  CL 105 103 101 99  CO2 28 25 28 29   GLUCOSE 121* 98 105* 105*  BUN 9 11 10 9   CREATININE 0.96 0.94 0.89 1.20*  CALCIUM 8.8* 8.8* 8.6* 8.8*  MG  --   --  1.6* 1.9    GFR: Estimated Creatinine Clearance: 47.5 mL/min (A) (by C-G  formula based on SCr of 1.2 mg/dL (H)).  Liver Function Tests: Recent Labs  Lab 12/26/22 2055  AST 17  ALT 12  ALKPHOS 80  BILITOT 0.4  PROT 5.7*  ALBUMIN 2.0*   No results for input(s): "LIPASE", "AMYLASE" in the last 168 hours. No results for input(s): "AMMONIA" in the last 168 hours.  Coagulation Profile: No results for input(s): "INR", "PROTIME" in the last 168 hours.  Cardiac Enzymes: No results for input(s): "CKTOTAL", "CKMB", "CKMBINDEX", "TROPONINI" in the last 168 hours.  BNP (last 3 results) No results for input(s): "PROBNP" in the last 8760 hours.  Lipid Profile: No results for input(s): "CHOL", "HDL", "LDLCALC", "TRIG", "CHOLHDL", "LDLDIRECT" in the last 72 hours.  Thyroid Function Tests: No results for input(s): "TSH", "T4TOTAL", "FREET4", "T3FREE", "THYROIDAB" in the last 72 hours.   Anemia Panel: No results for input(s): "VITAMINB12", "FOLATE", "FERRITIN", "TIBC", "IRON", "RETICCTPCT" in the last 72 hours.  Urine analysis:    Component Value Date/Time   COLORURINE YELLOW  09/18/2022 1355   APPEARANCEUR CLEAR 09/18/2022 1355   LABSPEC 1.029 09/18/2022 1355   PHURINE 5.0 09/18/2022 1355   GLUCOSEU NEGATIVE 09/18/2022 1355   HGBUR NEGATIVE 09/18/2022 1355   BILIRUBINUR MODERATE (A) 09/18/2022 1355   KETONESUR NEGATIVE 09/18/2022 1355   PROTEINUR 30 (A) 09/18/2022 1355   UROBILINOGEN 1.0 03/05/2012 1200   NITRITE NEGATIVE 09/18/2022 1355   LEUKOCYTESUR NEGATIVE 09/18/2022 1355    Sepsis Labs: Lactic Acid, Venous    Component Value Date/Time   LATICACIDVEN 0.7 12/24/2022 1835    MICROBIOLOGY: Recent Results (from the past 240 hour(s))  Resp panel by RT-PCR (RSV, Flu A&B, Covid) Anterior Nasal Swab     Status: None   Collection Time: 12/24/22  4:01 PM   Specimen: Anterior Nasal Swab  Result Value Ref Range Status   SARS Coronavirus 2 by RT PCR NEGATIVE NEGATIVE Final    Comment: (NOTE) SARS-CoV-2 target nucleic acids are NOT DETECTED.  The SARS-CoV-2 RNA is generally detectable in upper respiratory specimens during the acute phase of infection. The lowest concentration of SARS-CoV-2 viral copies this assay can detect is 138 copies/mL. A negative result does not preclude SARS-Cov-2 infection and should not be used as the sole basis for treatment or other patient management decisions. A negative result may occur with  improper specimen collection/handling, submission of specimen other than nasopharyngeal swab, presence of viral mutation(s) within the areas targeted by this assay, and inadequate number of viral copies(<138 copies/mL). A negative result must be combined with clinical observations, patient history, and epidemiological information. The expected result is Negative.  Fact Sheet for Patients:  BloggerCourse.com  Fact Sheet for Healthcare Providers:  SeriousBroker.it  This test is no t yet approved or cleared by the [place] FDA and  has been authorized for detection and/or  diagnosis of SARS-CoV-2 by FDA under an Emergency Use Authorization (EUA). This EUA will remain  in effect (meaning this test can be used) for the duration of the COVID-19 declaration under Section 564(b)(1) of the Act, 21 U.S.C.section 360bbb-3(b)(1), unless the authorization is terminated  or revoked sooner.       Influenza A by PCR NEGATIVE NEGATIVE Final   Influenza B by PCR NEGATIVE NEGATIVE Final    Comment: (NOTE) The Xpert Xpress SARS-CoV-2/FLU/RSV plus assay is intended as an aid in the diagnosis of influenza from Nasopharyngeal swab specimens and should not be used as a sole basis for treatment. Nasal washings and aspirates are unacceptable for Xpert Xpress SARS-CoV-2/FLU/RSV  testing.  Fact Sheet for Patients: BloggerCourse.com  Fact Sheet for Healthcare Providers: SeriousBroker.it  This test is not yet approved or cleared by the [place] FDA and has been authorized for detection and/or diagnosis of SARS-CoV-2 by FDA under an Emergency Use Authorization (EUA). This EUA will remain in effect (meaning this test can be used) for the duration of the COVID-19 declaration under Section 564(b)(1) of the Act, 21 U.S.C. section 360bbb-3(b)(1), unless the authorization is terminated or revoked.     Resp Syncytial Virus by PCR NEGATIVE NEGATIVE Final    Comment: (NOTE) Fact Sheet for Patients: BloggerCourse.com  Fact Sheet for Healthcare Providers: SeriousBroker.it  This test is not yet approved or cleared by the [place] FDA and has been authorized for detection and/or diagnosis of SARS-CoV-2 by FDA under an Emergency Use Authorization (EUA). This EUA will remain in effect (meaning this test can be used) for the duration of the COVID-19 declaration under Section 564(b)(1) of the Act, 21 U.S.C. section 360bbb-3(b)(1), unless the authorization is terminated  or revoked.  Performed at [facility], [address], [place], [place] [address]   Culture, blood (routine x 2)     Status: None   Collection Time: 12/24/22  6:35 PM   Specimen: Left Antecubital; Blood  Result Value Ref Range Status   Specimen Description LEFT ANTECUBITAL  Final   Special Requests NONE  Final   Culture   Final    NO GROWTH 5 DAYS Performed at [facility], [address] [place], [place] [address]    Report Status 12/29/2022 FINAL  Final  Culture, blood (routine x 2)     Status: None   Collection Time: 12/24/22  8:31 PM   Specimen: Left Antecubital; Blood  Result Value Ref Range Status   Specimen Description LEFT ANTECUBITAL BLOOD  Final   Special Requests   Final    BOTTLES DRAWN AEROBIC ONLY Blood Culture adequate volume   Culture   Final    NO GROWTH 5 DAYS Performed at [facility], [address], [place], [place] [address]    Report Status 12/29/2022 FINAL  Final  Gram stain     Status: None   Collection Time: 12/25/22 10:35 AM   Specimen: Pleura  Result Value Ref Range Status   Specimen Description PLEURAL  Final   Special Requests NONE  Final   Gram Stain   Final    NO ORGANISMS SEEN WBC PRESENT,BOTH PMN AND MONONUCLEAR Performed at [facility], [address] [place], [place] [address]    Report Status 12/25/2022 FINAL  Final  Culture, body fluid w Gram Stain-bottle     Status: None   Collection Time: 12/25/22 10:35 AM   Specimen: Pleura  Result Value Ref Range Status   Specimen Description PLEURAL  Final   Special Requests BOTTLES DRAWN AEROBIC AND ANAEROBIC 10CC  Final   Culture   Final    NO GROWTH 5 DAYS Performed at [facility], [address] [place], [place] [address]    Report Status 12/30/2022 FINAL  Final  Body fluid culture w Gram Stain     Status: None (Preliminary result)   Collection Time: 12/29/22  2:38 PM   Specimen: Pleural Fluid  Result Value Ref Range Status   Specimen Description PLEURAL  Final   Special Requests  NONE  Final   Gram Stain   Final    NO ORGANISMS SEEN NO WBC SEEN RED BLOOD CELLS PRESENT    Culture  Final    NO GROWTH 2 DAYS Performed at [facility], [address] [place], [place] [address]    Report Status PENDING  Incomplete    RADIOLOGY STUDIES/RESULTS: DG CHEST PORT 1 VIEW  Result Date: 12/31/2022 CLINICAL DATA:  Pleural effusion. EXAM: PORTABLE CHEST 1 VIEW COMPARISON:  12/30/2022. FINDINGS: There is significantly improved aeration at the left mid lung. Persistent alveolar consolidation left base and left upper lung. Diffuse pulmonary interstitial prominence consistent with edema. Enlarged cardiac silhouette. No pneumothorax. Right-sided Port-A-Cath tip mid SVC. Drainage catheter in the soft tissues of the left lateral chest wall has been removed. IMPRESSION: Improved aeration left mid lung. Pulmonary vascular congestion. Enlarged cardiac silhouette. Electronically Signed   By: Layla Maw M.D.   On: 12/31/2022 10:39   DG CHEST PORT 1 VIEW  Result Date: 12/30/2022 CLINICAL DATA:  Chest tube in place. EXAM: PORTABLE CHEST 1 VIEW COMPARISON:  One-view chest x-ray 12/28/2022. FINDINGS: A right IJ Port-A-Cath is in place. The heart is enlarged. Atherosclerotic calcifications are present at the aortic arch. Left greater than right pleural effusions are present. A small bore catheter is outside the chest cavity. No pneumothorax is present. IMPRESSION: 1. Left greater than right pleural effusions. 2. Cardiomegaly without failure. 3. Small bore catheter is outside the chest cavity. 4. Right IJ Port-A-Cath. Critical Value/emergent results were called by telephone at the time of interpretation on 12/30/2022 at 2:43 pm to provider Dr. Irma Newness , who verbally acknowledged these results. Electronically Signed   By: Marin Roberts M.D.   On: 12/30/2022 14:44     LOS: 8 days   Jeoffrey Massed, MD  [facility]    To contact the attending provider between 7A-7P  or the covering provider during after hours 7P-7A, please log into the web site [email or web address] and access using universal [place] password for that web site. If you do not have the password, please call the hospital operator.  01/01/2023, 9:58 AM

## 2023-01-02 ENCOUNTER — Other Ambulatory Visit (HOSPITAL_COMMUNITY): Payer: Self-pay

## 2023-01-02 ENCOUNTER — Encounter: Payer: Self-pay | Admitting: Hematology

## 2023-01-02 DIAGNOSIS — J9621 Acute and chronic respiratory failure with hypoxia: Secondary | ICD-10-CM | POA: Diagnosis not present

## 2023-01-02 DIAGNOSIS — J189 Pneumonia, unspecified organism: Secondary | ICD-10-CM | POA: Diagnosis not present

## 2023-01-02 DIAGNOSIS — I4891 Unspecified atrial fibrillation: Secondary | ICD-10-CM | POA: Diagnosis not present

## 2023-01-02 DIAGNOSIS — J90 Pleural effusion, not elsewhere classified: Secondary | ICD-10-CM | POA: Diagnosis not present

## 2023-01-02 LAB — CYTOLOGY - NON PAP

## 2023-01-02 LAB — CBC
HCT: 28.2 % — ABNORMAL LOW (ref 36.0–46.0)
Hemoglobin: 8.5 g/dL — ABNORMAL LOW (ref 12.0–15.0)
MCH: 29.9 pg (ref 26.0–34.0)
MCHC: 30.1 g/dL (ref 30.0–36.0)
MCV: 99.3 fL (ref 80.0–100.0)
Platelets: 479 10*3/uL — ABNORMAL HIGH (ref 150–400)
RBC: 2.84 MIL/uL — ABNORMAL LOW (ref 3.87–5.11)
RDW: 15.8 % — ABNORMAL HIGH (ref 11.5–15.5)
WBC: 8.4 10*3/uL (ref 4.0–10.5)
nRBC: 0 % (ref 0.0–0.2)

## 2023-01-02 LAB — GLUCOSE, CAPILLARY
Glucose-Capillary: 106 mg/dL — ABNORMAL HIGH (ref 70–99)
Glucose-Capillary: 102 mg/dL — ABNORMAL HIGH (ref 70–99)

## 2023-01-02 LAB — BODY FLUID CULTURE W GRAM STAIN
Culture: NO GROWTH
Gram Stain: NONE SEEN

## 2023-01-02 MED ORDER — FUROSEMIDE 40 MG PO TABS
40.0000 mg | ORAL_TABLET | Freq: Every day | ORAL | 0 refills | Status: DC
  Filled 2023-01-02: qty 30, 30d supply, fill #0

## 2023-01-02 MED ORDER — HEPARIN SOD (PORK) LOCK FLUSH 100 UNIT/ML IV SOLN
500.0000 [IU] | Freq: Once | INTRAVENOUS | Status: AC
  Administered 2023-01-02: 500 [IU] via INTRAVENOUS
  Filled 2023-01-02: qty 5

## 2023-01-02 MED ORDER — AMOXICILLIN-POT CLAVULANATE 875-125 MG PO TABS
1.0000 | ORAL_TABLET | Freq: Two times a day (BID) | ORAL | Status: DC
  Administered 2023-01-02: 1 via ORAL
  Filled 2023-01-02: qty 1

## 2023-01-02 MED ORDER — APIXABAN 5 MG PO TABS
5.0000 mg | ORAL_TABLET | Freq: Two times a day (BID) | ORAL | Status: DC
  Administered 2023-01-02: 5 mg via ORAL
  Filled 2023-01-02: qty 1

## 2023-01-02 MED ORDER — FLUCONAZOLE 150 MG PO TABS
150.0000 mg | ORAL_TABLET | Freq: Once | ORAL | Status: AC
  Administered 2023-01-02: 150 mg via ORAL
  Filled 2023-01-02: qty 1

## 2023-01-02 MED ORDER — MIDODRINE HCL 10 MG PO TABS
10.0000 mg | ORAL_TABLET | Freq: Three times a day (TID) | ORAL | 0 refills | Status: DC
  Filled 2023-01-02: qty 90, 30d supply, fill #0

## 2023-01-02 MED ORDER — ALBUTEROL SULFATE HFA 108 (90 BASE) MCG/ACT IN AERS
2.0000 | INHALATION_SPRAY | Freq: Four times a day (QID) | RESPIRATORY_TRACT | 0 refills | Status: DC | PRN
  Filled 2023-01-02: qty 6.7, 25d supply, fill #0

## 2023-01-02 MED ORDER — AMOXICILLIN-POT CLAVULANATE 875-125 MG PO TABS
1.0000 | ORAL_TABLET | Freq: Two times a day (BID) | ORAL | 0 refills | Status: AC
  Filled 2023-01-02: qty 14, 7d supply, fill #0

## 2023-01-02 NOTE — Discharge Summary (Signed)
PORT 1 VIEW  Result Date: 12/28/2022 CLINICAL DATA:  Shortness of breath EXAM: PORTABLE CHEST 1 VIEW COMPARISON:  12/26/2022 FINDINGS: Single frontal view of the chest demonstrates a stable cardiac silhouette. Stable chronic consolidation and volume loss within the left hemithorax consistent with post therapeutic changes in a patient with a known history of lung cancer. Stable bilateral pleural effusions, left greater than right. No new consolidation. No pneumothorax. Stable right chest wall port. IMPRESSION: 1. Stable bilateral pleural effusions, left greater than right. 2. Stable chronic consolidation within the left lung consistent with post therapeutic changes in a patient with a history of lung cancer. Electronically Signed   By: Sharlet Salina M.D.   On: 12/28/2022 09:11   DG CHEST PORT 1 VIEW  Result Date:  12/26/2022 CLINICAL DATA:  Pleural effusion. EXAM: PORTABLE CHEST 1 VIEW COMPARISON:  Chest radiograph 12/25/2022 and CT 12/24/2022 FINDINGS: A right jugular Port-A-Cath remains in place, terminating over the mid SVC. The left heart border remains obscured. Aortic atherosclerosis is noted. There is a persistent moderate left pleural effusion with apparent mild differences compared to yesterday's radiograph potentially due to differences in patient positioning. Left suprahilar lung opacity has not significantly changed in the setting of previous radiation for lung cancer. Left lower lung airspace opacities are stable to mildly improved. Hazy and patchy airspace opacities throughout the right lung have not significantly changed, and there is likely a persistent small right pleural effusion. No pneumothorax is identified. IMPRESSION: 1. Persistent moderate left pleural effusion with stable to mildly improved left lung aeration. 2. Unchanged right lung infiltrates. Electronically Signed   By: Sebastian Ache M.D.   On: 12/26/2022 11:12   ECHOCARDIOGRAM LIMITED  Result Date: 12/25/2022    ECHOCARDIOGRAM LIMITED REPORT   Patient Name:   Denise [name] Date of Exam: 12/25/2022 Medical Rec #:  [id number]   Height:       62.0 in Accession #:    [id number]  Weight:       220.0 lb Date of Birth:  1950-09-07    BSA:          1.991 m Patient Age:    72 years    BP:           106/62 mmHg Patient Gender: F           HR:           112 bpm. [facility] Procedure: Limited Echo Indications:    Atrial Fibrillation I48.91  History:        Patient has prior history of Echocardiogram examinations, most                 recent 07/19/2022. Risk Factors:Hypertension, Diabetes and Former                 Smoker.  Sonographer:    Celesta Gentile RCS Referring Phys: [phone] Heloise Beecham EMOKPAE IMPRESSIONS  1. Limited study.  2. Left ventricular ejection fraction, by estimation, is 65 to 70%. The left ventricle has normal function. The left  ventricle has no regional wall motion abnormalities. There is moderate concentric left ventricular hypertrophy. There is the interventricular septum is flattened in systole and diastole, consistent with right ventricular pressure and volume overload.  3. Right ventricular systolic function is normal. The right ventricular size is mildly enlarged.  4. A small pericardial effusion is present. The pericardial effusion is circumferential.  5. The mitral valve is degenerative. Moderate mitral annular calcification.  6. The  PORT 1 VIEW  Result Date: 12/28/2022 CLINICAL DATA:  Shortness of breath EXAM: PORTABLE CHEST 1 VIEW COMPARISON:  12/26/2022 FINDINGS: Single frontal view of the chest demonstrates a stable cardiac silhouette. Stable chronic consolidation and volume loss within the left hemithorax consistent with post therapeutic changes in a patient with a known history of lung cancer. Stable bilateral pleural effusions, left greater than right. No new consolidation. No pneumothorax. Stable right chest wall port. IMPRESSION: 1. Stable bilateral pleural effusions, left greater than right. 2. Stable chronic consolidation within the left lung consistent with post therapeutic changes in a patient with a history of lung cancer. Electronically Signed   By: Sharlet Salina M.D.   On: 12/28/2022 09:11   DG CHEST PORT 1 VIEW  Result Date:  12/26/2022 CLINICAL DATA:  Pleural effusion. EXAM: PORTABLE CHEST 1 VIEW COMPARISON:  Chest radiograph 12/25/2022 and CT 12/24/2022 FINDINGS: A right jugular Port-A-Cath remains in place, terminating over the mid SVC. The left heart border remains obscured. Aortic atherosclerosis is noted. There is a persistent moderate left pleural effusion with apparent mild differences compared to yesterday's radiograph potentially due to differences in patient positioning. Left suprahilar lung opacity has not significantly changed in the setting of previous radiation for lung cancer. Left lower lung airspace opacities are stable to mildly improved. Hazy and patchy airspace opacities throughout the right lung have not significantly changed, and there is likely a persistent small right pleural effusion. No pneumothorax is identified. IMPRESSION: 1. Persistent moderate left pleural effusion with stable to mildly improved left lung aeration. 2. Unchanged right lung infiltrates. Electronically Signed   By: Sebastian Ache M.D.   On: 12/26/2022 11:12   ECHOCARDIOGRAM LIMITED  Result Date: 12/25/2022    ECHOCARDIOGRAM LIMITED REPORT   Patient Name:   Denise [name] Date of Exam: 12/25/2022 Medical Rec #:  [id number]   Height:       62.0 in Accession #:    [id number]  Weight:       220.0 lb Date of Birth:  1950-09-07    BSA:          1.991 m Patient Age:    72 years    BP:           106/62 mmHg Patient Gender: F           HR:           112 bpm. [facility] Procedure: Limited Echo Indications:    Atrial Fibrillation I48.91  History:        Patient has prior history of Echocardiogram examinations, most                 recent 07/19/2022. Risk Factors:Hypertension, Diabetes and Former                 Smoker.  Sonographer:    Celesta Gentile RCS Referring Phys: [phone] Heloise Beecham EMOKPAE IMPRESSIONS  1. Limited study.  2. Left ventricular ejection fraction, by estimation, is 65 to 70%. The left ventricle has normal function. The left  ventricle has no regional wall motion abnormalities. There is moderate concentric left ventricular hypertrophy. There is the interventricular septum is flattened in systole and diastole, consistent with right ventricular pressure and volume overload.  3. Right ventricular systolic function is normal. The right ventricular size is mildly enlarged.  4. A small pericardial effusion is present. The pericardial effusion is circumferential.  5. The mitral valve is degenerative. Moderate mitral annular calcification.  6. The  PORT 1 VIEW  Result Date: 12/28/2022 CLINICAL DATA:  Shortness of breath EXAM: PORTABLE CHEST 1 VIEW COMPARISON:  12/26/2022 FINDINGS: Single frontal view of the chest demonstrates a stable cardiac silhouette. Stable chronic consolidation and volume loss within the left hemithorax consistent with post therapeutic changes in a patient with a known history of lung cancer. Stable bilateral pleural effusions, left greater than right. No new consolidation. No pneumothorax. Stable right chest wall port. IMPRESSION: 1. Stable bilateral pleural effusions, left greater than right. 2. Stable chronic consolidation within the left lung consistent with post therapeutic changes in a patient with a history of lung cancer. Electronically Signed   By: Sharlet Salina M.D.   On: 12/28/2022 09:11   DG CHEST PORT 1 VIEW  Result Date:  12/26/2022 CLINICAL DATA:  Pleural effusion. EXAM: PORTABLE CHEST 1 VIEW COMPARISON:  Chest radiograph 12/25/2022 and CT 12/24/2022 FINDINGS: A right jugular Port-A-Cath remains in place, terminating over the mid SVC. The left heart border remains obscured. Aortic atherosclerosis is noted. There is a persistent moderate left pleural effusion with apparent mild differences compared to yesterday's radiograph potentially due to differences in patient positioning. Left suprahilar lung opacity has not significantly changed in the setting of previous radiation for lung cancer. Left lower lung airspace opacities are stable to mildly improved. Hazy and patchy airspace opacities throughout the right lung have not significantly changed, and there is likely a persistent small right pleural effusion. No pneumothorax is identified. IMPRESSION: 1. Persistent moderate left pleural effusion with stable to mildly improved left lung aeration. 2. Unchanged right lung infiltrates. Electronically Signed   By: Sebastian Ache M.D.   On: 12/26/2022 11:12   ECHOCARDIOGRAM LIMITED  Result Date: 12/25/2022    ECHOCARDIOGRAM LIMITED REPORT   Patient Name:   Denise [name] Date of Exam: 12/25/2022 Medical Rec #:  [id number]   Height:       62.0 in Accession #:    [id number]  Weight:       220.0 lb Date of Birth:  1950-09-07    BSA:          1.991 m Patient Age:    72 years    BP:           106/62 mmHg Patient Gender: F           HR:           112 bpm. [facility] Procedure: Limited Echo Indications:    Atrial Fibrillation I48.91  History:        Patient has prior history of Echocardiogram examinations, most                 recent 07/19/2022. Risk Factors:Hypertension, Diabetes and Former                 Smoker.  Sonographer:    Celesta Gentile RCS Referring Phys: [phone] Heloise Beecham EMOKPAE IMPRESSIONS  1. Limited study.  2. Left ventricular ejection fraction, by estimation, is 65 to 70%. The left ventricle has normal function. The left  ventricle has no regional wall motion abnormalities. There is moderate concentric left ventricular hypertrophy. There is the interventricular septum is flattened in systole and diastole, consistent with right ventricular pressure and volume overload.  3. Right ventricular systolic function is normal. The right ventricular size is mildly enlarged.  4. A small pericardial effusion is present. The pericardial effusion is circumferential.  5. The mitral valve is degenerative. Moderate mitral annular calcification.  6. The  PATIENT DETAILS Name: Denise [name] Age: 72 y.o. Sex: female Date of Birth: Feb 09, 1951 MRN: [id number]. Admitting Physician: Onnie Boer, MD ZDG:UYQI, Earvin Hansen, MD  Admit Date: 12/24/2022 Discharge date: 01/02/2023  Recommendations for Outpatient Follow-up:  Follow up with PCP in 1-2 weeks Please obtain CMP/CBC in one week Please ensure follow-up with oncology.  Admitted From:  Home  Disposition: Home   Discharge Condition: fair  CODE STATUS:   Code Status: Full Code   Diet recommendation:  Diet Order             Diet - low sodium heart healthy           Diet Heart Room service appropriate? Yes; Fluid consistency: Thin  Diet effective now                    Brief Summary: Patient is a 72 y.o.  female with history of metastatic/stage IV adenocarcinoma of the lung, chronic toxic respiratory failure on 2 L of oxygen, PE, DM-2, HTN, aortic stenosis-who was admitted to [facility] on 10/6 with shortness of breath-for her to have large left-sided pleural effusion-underwent thoracocentesis-workup was suggestive of an exudate-and transferred to [facility] for pulmonary evaluation.   Significant events: 10/6>> admit to [facility]   Significant studies: 10/6>> CTA chest: No PE, extensive symmetric groundglass/consolidative changes-large left pleural effusion.  Stay able enhancing lesions in the liver bilaterally. 10/7>> echo: EF 65-70% 10/14>> CT chest: Small to moderate loculated left pleural effusion-decrease in size compared to prior study.   Significant microbiology/pathology data: 10/6>> COVID/influenza/RSV PCR: Negative 10/6>> blood culture: No growth 10/7>> pleural fluid culture: No growth 10/7>> pleural fluid cytology: No malignancy   Procedures: 10/7>> ultrasound-guided thoracocentesis (WBC 1332-60% neutrophils, LDH 278) 10/11>> chest tube placement by PCCM   Consults: PCCM    Brief Hospital Course: Severe sepsis secondary to multifocal pneumonia  with possible parapneumonic effusion Acute on chronic hypoxic respiratory failure secondary to PNA-pleural effusion Hypoxia is gradually improved with supportive care/thoracocentesis/chest tube tube placement/antibiotics Back on her usual home regimen of oxygen of 2-3 L.   Exudative pleural effusion Unclear whether this is parapneumonic or related to malignancy Pleural fluid cultures and cytology negative so far PCCM placed chest tube on 10/11-with tentative plans to start lytic therapy-however unfortunately chest tube was dislodged on 10/12.  PCCM repeated a CT chest on 10/14-pleural effusion is very small-no further recommendations apart from extending antibiotic therapy from [facility] plans to replace chest tube.   PCP/oncology to continue to monitor closely in the outpatient setting.   PAF with RVR Sign rhythm Continue Coreg Cardizem discontinued on 10/10 Eliquis held for chest tube placement-placed on IV heparin-has been transitioned back to Eliquis.  HTN BP on the soft side-per patient this is chronic Continue Coreg Continue midodrine Follow/optimize.   Hypomagnesemia Repleted   Chronic HFpEF Euvolemic BPs soft but stable-midodrine dosage escalated 10/9 Beta-blocker/Lasix   Aortic stenosis chronic issue. Stable for outpatient follow-up   Normocytic anemia Likely due to underlying malignancy No evidence of blood loss Follow CBC   Metastatic adenocarcinoma of the lung Follow-up with oncology in the outpatient setting   History of unprovoked pulmonary embolism Continue indefinite anticoagulation.   Morbid Obesity: Estimated body mass index is 40.24 kg/m as calculated from the following:   Height as of this encounter: 5\' 2"  (1.575 m).   Weight as of this encounter: 99.8 kg.     Discharge Diagnoses:  Principal Problem:   Acute on chronic respiratory failure with hypoxia (  PATIENT DETAILS Name: Denise [name] Age: 72 y.o. Sex: female Date of Birth: Feb 09, 1951 MRN: [id number]. Admitting Physician: Onnie Boer, MD ZDG:UYQI, Earvin Hansen, MD  Admit Date: 12/24/2022 Discharge date: 01/02/2023  Recommendations for Outpatient Follow-up:  Follow up with PCP in 1-2 weeks Please obtain CMP/CBC in one week Please ensure follow-up with oncology.  Admitted From:  Home  Disposition: Home   Discharge Condition: fair  CODE STATUS:   Code Status: Full Code   Diet recommendation:  Diet Order             Diet - low sodium heart healthy           Diet Heart Room service appropriate? Yes; Fluid consistency: Thin  Diet effective now                    Brief Summary: Patient is a 72 y.o.  female with history of metastatic/stage IV adenocarcinoma of the lung, chronic toxic respiratory failure on 2 L of oxygen, PE, DM-2, HTN, aortic stenosis-who was admitted to [facility] on 10/6 with shortness of breath-for her to have large left-sided pleural effusion-underwent thoracocentesis-workup was suggestive of an exudate-and transferred to [facility] for pulmonary evaluation.   Significant events: 10/6>> admit to [facility]   Significant studies: 10/6>> CTA chest: No PE, extensive symmetric groundglass/consolidative changes-large left pleural effusion.  Stay able enhancing lesions in the liver bilaterally. 10/7>> echo: EF 65-70% 10/14>> CT chest: Small to moderate loculated left pleural effusion-decrease in size compared to prior study.   Significant microbiology/pathology data: 10/6>> COVID/influenza/RSV PCR: Negative 10/6>> blood culture: No growth 10/7>> pleural fluid culture: No growth 10/7>> pleural fluid cytology: No malignancy   Procedures: 10/7>> ultrasound-guided thoracocentesis (WBC 1332-60% neutrophils, LDH 278) 10/11>> chest tube placement by PCCM   Consults: PCCM    Brief Hospital Course: Severe sepsis secondary to multifocal pneumonia  with possible parapneumonic effusion Acute on chronic hypoxic respiratory failure secondary to PNA-pleural effusion Hypoxia is gradually improved with supportive care/thoracocentesis/chest tube tube placement/antibiotics Back on her usual home regimen of oxygen of 2-3 L.   Exudative pleural effusion Unclear whether this is parapneumonic or related to malignancy Pleural fluid cultures and cytology negative so far PCCM placed chest tube on 10/11-with tentative plans to start lytic therapy-however unfortunately chest tube was dislodged on 10/12.  PCCM repeated a CT chest on 10/14-pleural effusion is very small-no further recommendations apart from extending antibiotic therapy from [facility] plans to replace chest tube.   PCP/oncology to continue to monitor closely in the outpatient setting.   PAF with RVR Sign rhythm Continue Coreg Cardizem discontinued on 10/10 Eliquis held for chest tube placement-placed on IV heparin-has been transitioned back to Eliquis.  HTN BP on the soft side-per patient this is chronic Continue Coreg Continue midodrine Follow/optimize.   Hypomagnesemia Repleted   Chronic HFpEF Euvolemic BPs soft but stable-midodrine dosage escalated 10/9 Beta-blocker/Lasix   Aortic stenosis chronic issue. Stable for outpatient follow-up   Normocytic anemia Likely due to underlying malignancy No evidence of blood loss Follow CBC   Metastatic adenocarcinoma of the lung Follow-up with oncology in the outpatient setting   History of unprovoked pulmonary embolism Continue indefinite anticoagulation.   Morbid Obesity: Estimated body mass index is 40.24 kg/m as calculated from the following:   Height as of this encounter: 5\' 2"  (1.575 m).   Weight as of this encounter: 99.8 kg.     Discharge Diagnoses:  Principal Problem:   Acute on chronic respiratory failure with hypoxia (  PATIENT DETAILS Name: Denise [name] Age: 72 y.o. Sex: female Date of Birth: Feb 09, 1951 MRN: [id number]. Admitting Physician: Onnie Boer, MD ZDG:UYQI, Earvin Hansen, MD  Admit Date: 12/24/2022 Discharge date: 01/02/2023  Recommendations for Outpatient Follow-up:  Follow up with PCP in 1-2 weeks Please obtain CMP/CBC in one week Please ensure follow-up with oncology.  Admitted From:  Home  Disposition: Home   Discharge Condition: fair  CODE STATUS:   Code Status: Full Code   Diet recommendation:  Diet Order             Diet - low sodium heart healthy           Diet Heart Room service appropriate? Yes; Fluid consistency: Thin  Diet effective now                    Brief Summary: Patient is a 72 y.o.  female with history of metastatic/stage IV adenocarcinoma of the lung, chronic toxic respiratory failure on 2 L of oxygen, PE, DM-2, HTN, aortic stenosis-who was admitted to [facility] on 10/6 with shortness of breath-for her to have large left-sided pleural effusion-underwent thoracocentesis-workup was suggestive of an exudate-and transferred to [facility] for pulmonary evaluation.   Significant events: 10/6>> admit to [facility]   Significant studies: 10/6>> CTA chest: No PE, extensive symmetric groundglass/consolidative changes-large left pleural effusion.  Stay able enhancing lesions in the liver bilaterally. 10/7>> echo: EF 65-70% 10/14>> CT chest: Small to moderate loculated left pleural effusion-decrease in size compared to prior study.   Significant microbiology/pathology data: 10/6>> COVID/influenza/RSV PCR: Negative 10/6>> blood culture: No growth 10/7>> pleural fluid culture: No growth 10/7>> pleural fluid cytology: No malignancy   Procedures: 10/7>> ultrasound-guided thoracocentesis (WBC 1332-60% neutrophils, LDH 278) 10/11>> chest tube placement by PCCM   Consults: PCCM    Brief Hospital Course: Severe sepsis secondary to multifocal pneumonia  with possible parapneumonic effusion Acute on chronic hypoxic respiratory failure secondary to PNA-pleural effusion Hypoxia is gradually improved with supportive care/thoracocentesis/chest tube tube placement/antibiotics Back on her usual home regimen of oxygen of 2-3 L.   Exudative pleural effusion Unclear whether this is parapneumonic or related to malignancy Pleural fluid cultures and cytology negative so far PCCM placed chest tube on 10/11-with tentative plans to start lytic therapy-however unfortunately chest tube was dislodged on 10/12.  PCCM repeated a CT chest on 10/14-pleural effusion is very small-no further recommendations apart from extending antibiotic therapy from [facility] plans to replace chest tube.   PCP/oncology to continue to monitor closely in the outpatient setting.   PAF with RVR Sign rhythm Continue Coreg Cardizem discontinued on 10/10 Eliquis held for chest tube placement-placed on IV heparin-has been transitioned back to Eliquis.  HTN BP on the soft side-per patient this is chronic Continue Coreg Continue midodrine Follow/optimize.   Hypomagnesemia Repleted   Chronic HFpEF Euvolemic BPs soft but stable-midodrine dosage escalated 10/9 Beta-blocker/Lasix   Aortic stenosis chronic issue. Stable for outpatient follow-up   Normocytic anemia Likely due to underlying malignancy No evidence of blood loss Follow CBC   Metastatic adenocarcinoma of the lung Follow-up with oncology in the outpatient setting   History of unprovoked pulmonary embolism Continue indefinite anticoagulation.   Morbid Obesity: Estimated body mass index is 40.24 kg/m as calculated from the following:   Height as of this encounter: 5\' 2"  (1.575 m).   Weight as of this encounter: 99.8 kg.     Discharge Diagnoses:  Principal Problem:   Acute on chronic respiratory failure with hypoxia (  PATIENT DETAILS Name: Denise [name] Age: 72 y.o. Sex: female Date of Birth: Feb 09, 1951 MRN: [id number]. Admitting Physician: Onnie Boer, MD ZDG:UYQI, Earvin Hansen, MD  Admit Date: 12/24/2022 Discharge date: 01/02/2023  Recommendations for Outpatient Follow-up:  Follow up with PCP in 1-2 weeks Please obtain CMP/CBC in one week Please ensure follow-up with oncology.  Admitted From:  Home  Disposition: Home   Discharge Condition: fair  CODE STATUS:   Code Status: Full Code   Diet recommendation:  Diet Order             Diet - low sodium heart healthy           Diet Heart Room service appropriate? Yes; Fluid consistency: Thin  Diet effective now                    Brief Summary: Patient is a 72 y.o.  female with history of metastatic/stage IV adenocarcinoma of the lung, chronic toxic respiratory failure on 2 L of oxygen, PE, DM-2, HTN, aortic stenosis-who was admitted to [facility] on 10/6 with shortness of breath-for her to have large left-sided pleural effusion-underwent thoracocentesis-workup was suggestive of an exudate-and transferred to [facility] for pulmonary evaluation.   Significant events: 10/6>> admit to [facility]   Significant studies: 10/6>> CTA chest: No PE, extensive symmetric groundglass/consolidative changes-large left pleural effusion.  Stay able enhancing lesions in the liver bilaterally. 10/7>> echo: EF 65-70% 10/14>> CT chest: Small to moderate loculated left pleural effusion-decrease in size compared to prior study.   Significant microbiology/pathology data: 10/6>> COVID/influenza/RSV PCR: Negative 10/6>> blood culture: No growth 10/7>> pleural fluid culture: No growth 10/7>> pleural fluid cytology: No malignancy   Procedures: 10/7>> ultrasound-guided thoracocentesis (WBC 1332-60% neutrophils, LDH 278) 10/11>> chest tube placement by PCCM   Consults: PCCM    Brief Hospital Course: Severe sepsis secondary to multifocal pneumonia  with possible parapneumonic effusion Acute on chronic hypoxic respiratory failure secondary to PNA-pleural effusion Hypoxia is gradually improved with supportive care/thoracocentesis/chest tube tube placement/antibiotics Back on her usual home regimen of oxygen of 2-3 L.   Exudative pleural effusion Unclear whether this is parapneumonic or related to malignancy Pleural fluid cultures and cytology negative so far PCCM placed chest tube on 10/11-with tentative plans to start lytic therapy-however unfortunately chest tube was dislodged on 10/12.  PCCM repeated a CT chest on 10/14-pleural effusion is very small-no further recommendations apart from extending antibiotic therapy from [facility] plans to replace chest tube.   PCP/oncology to continue to monitor closely in the outpatient setting.   PAF with RVR Sign rhythm Continue Coreg Cardizem discontinued on 10/10 Eliquis held for chest tube placement-placed on IV heparin-has been transitioned back to Eliquis.  HTN BP on the soft side-per patient this is chronic Continue Coreg Continue midodrine Follow/optimize.   Hypomagnesemia Repleted   Chronic HFpEF Euvolemic BPs soft but stable-midodrine dosage escalated 10/9 Beta-blocker/Lasix   Aortic stenosis chronic issue. Stable for outpatient follow-up   Normocytic anemia Likely due to underlying malignancy No evidence of blood loss Follow CBC   Metastatic adenocarcinoma of the lung Follow-up with oncology in the outpatient setting   History of unprovoked pulmonary embolism Continue indefinite anticoagulation.   Morbid Obesity: Estimated body mass index is 40.24 kg/m as calculated from the following:   Height as of this encounter: 5\' 2"  (1.575 m).   Weight as of this encounter: 99.8 kg.     Discharge Diagnoses:  Principal Problem:   Acute on chronic respiratory failure with hypoxia (  PORT 1 VIEW  Result Date: 12/28/2022 CLINICAL DATA:  Shortness of breath EXAM: PORTABLE CHEST 1 VIEW COMPARISON:  12/26/2022 FINDINGS: Single frontal view of the chest demonstrates a stable cardiac silhouette. Stable chronic consolidation and volume loss within the left hemithorax consistent with post therapeutic changes in a patient with a known history of lung cancer. Stable bilateral pleural effusions, left greater than right. No new consolidation. No pneumothorax. Stable right chest wall port. IMPRESSION: 1. Stable bilateral pleural effusions, left greater than right. 2. Stable chronic consolidation within the left lung consistent with post therapeutic changes in a patient with a history of lung cancer. Electronically Signed   By: Sharlet Salina M.D.   On: 12/28/2022 09:11   DG CHEST PORT 1 VIEW  Result Date:  12/26/2022 CLINICAL DATA:  Pleural effusion. EXAM: PORTABLE CHEST 1 VIEW COMPARISON:  Chest radiograph 12/25/2022 and CT 12/24/2022 FINDINGS: A right jugular Port-A-Cath remains in place, terminating over the mid SVC. The left heart border remains obscured. Aortic atherosclerosis is noted. There is a persistent moderate left pleural effusion with apparent mild differences compared to yesterday's radiograph potentially due to differences in patient positioning. Left suprahilar lung opacity has not significantly changed in the setting of previous radiation for lung cancer. Left lower lung airspace opacities are stable to mildly improved. Hazy and patchy airspace opacities throughout the right lung have not significantly changed, and there is likely a persistent small right pleural effusion. No pneumothorax is identified. IMPRESSION: 1. Persistent moderate left pleural effusion with stable to mildly improved left lung aeration. 2. Unchanged right lung infiltrates. Electronically Signed   By: Sebastian Ache M.D.   On: 12/26/2022 11:12   ECHOCARDIOGRAM LIMITED  Result Date: 12/25/2022    ECHOCARDIOGRAM LIMITED REPORT   Patient Name:   Denise [name] Date of Exam: 12/25/2022 Medical Rec #:  [id number]   Height:       62.0 in Accession #:    [id number]  Weight:       220.0 lb Date of Birth:  1950-09-07    BSA:          1.991 m Patient Age:    72 years    BP:           106/62 mmHg Patient Gender: F           HR:           112 bpm. [facility] Procedure: Limited Echo Indications:    Atrial Fibrillation I48.91  History:        Patient has prior history of Echocardiogram examinations, most                 recent 07/19/2022. Risk Factors:Hypertension, Diabetes and Former                 Smoker.  Sonographer:    Celesta Gentile RCS Referring Phys: [phone] Heloise Beecham EMOKPAE IMPRESSIONS  1. Limited study.  2. Left ventricular ejection fraction, by estimation, is 65 to 70%. The left ventricle has normal function. The left  ventricle has no regional wall motion abnormalities. There is moderate concentric left ventricular hypertrophy. There is the interventricular septum is flattened in systole and diastole, consistent with right ventricular pressure and volume overload.  3. Right ventricular systolic function is normal. The right ventricular size is mildly enlarged.  4. A small pericardial effusion is present. The pericardial effusion is circumferential.  5. The mitral valve is degenerative. Moderate mitral annular calcification.  6. The  PORT 1 VIEW  Result Date: 12/28/2022 CLINICAL DATA:  Shortness of breath EXAM: PORTABLE CHEST 1 VIEW COMPARISON:  12/26/2022 FINDINGS: Single frontal view of the chest demonstrates a stable cardiac silhouette. Stable chronic consolidation and volume loss within the left hemithorax consistent with post therapeutic changes in a patient with a known history of lung cancer. Stable bilateral pleural effusions, left greater than right. No new consolidation. No pneumothorax. Stable right chest wall port. IMPRESSION: 1. Stable bilateral pleural effusions, left greater than right. 2. Stable chronic consolidation within the left lung consistent with post therapeutic changes in a patient with a history of lung cancer. Electronically Signed   By: Sharlet Salina M.D.   On: 12/28/2022 09:11   DG CHEST PORT 1 VIEW  Result Date:  12/26/2022 CLINICAL DATA:  Pleural effusion. EXAM: PORTABLE CHEST 1 VIEW COMPARISON:  Chest radiograph 12/25/2022 and CT 12/24/2022 FINDINGS: A right jugular Port-A-Cath remains in place, terminating over the mid SVC. The left heart border remains obscured. Aortic atherosclerosis is noted. There is a persistent moderate left pleural effusion with apparent mild differences compared to yesterday's radiograph potentially due to differences in patient positioning. Left suprahilar lung opacity has not significantly changed in the setting of previous radiation for lung cancer. Left lower lung airspace opacities are stable to mildly improved. Hazy and patchy airspace opacities throughout the right lung have not significantly changed, and there is likely a persistent small right pleural effusion. No pneumothorax is identified. IMPRESSION: 1. Persistent moderate left pleural effusion with stable to mildly improved left lung aeration. 2. Unchanged right lung infiltrates. Electronically Signed   By: Sebastian Ache M.D.   On: 12/26/2022 11:12   ECHOCARDIOGRAM LIMITED  Result Date: 12/25/2022    ECHOCARDIOGRAM LIMITED REPORT   Patient Name:   Denise [name] Date of Exam: 12/25/2022 Medical Rec #:  [id number]   Height:       62.0 in Accession #:    [id number]  Weight:       220.0 lb Date of Birth:  1950-09-07    BSA:          1.991 m Patient Age:    72 years    BP:           106/62 mmHg Patient Gender: F           HR:           112 bpm. [facility] Procedure: Limited Echo Indications:    Atrial Fibrillation I48.91  History:        Patient has prior history of Echocardiogram examinations, most                 recent 07/19/2022. Risk Factors:Hypertension, Diabetes and Former                 Smoker.  Sonographer:    Celesta Gentile RCS Referring Phys: [phone] Heloise Beecham EMOKPAE IMPRESSIONS  1. Limited study.  2. Left ventricular ejection fraction, by estimation, is 65 to 70%. The left ventricle has normal function. The left  ventricle has no regional wall motion abnormalities. There is moderate concentric left ventricular hypertrophy. There is the interventricular septum is flattened in systole and diastole, consistent with right ventricular pressure and volume overload.  3. Right ventricular systolic function is normal. The right ventricular size is mildly enlarged.  4. A small pericardial effusion is present. The pericardial effusion is circumferential.  5. The mitral valve is degenerative. Moderate mitral annular calcification.  6. The  PATIENT DETAILS Name: Denise [name] Age: 72 y.o. Sex: female Date of Birth: Feb 09, 1951 MRN: [id number]. Admitting Physician: Onnie Boer, MD ZDG:[id number], Earvin Hansen, MD  Admit Date: 12/24/2022 Discharge date: 01/02/2023  Recommendations for Outpatient Follow-up:  Follow up with PCP in 1-2 weeks Please obtain CMP/CBC in one week Please ensure follow-up with oncology.  Admitted From:  Home  Disposition: Home   Discharge Condition: fair  CODE STATUS:   Code Status: Full Code   Diet recommendation:  Diet Order             Diet - low sodium heart healthy           Diet Heart Room service appropriate? Yes; Fluid consistency: Thin  Diet effective now                    Brief Summary: Patient is a 72 y.o.  female with history of metastatic/stage IV adenocarcinoma of the lung, chronic toxic respiratory failure on 2 L of oxygen, PE, DM-2, HTN, aortic stenosis-who was admitted to [facility] on 10/6 with shortness of breath-for her to have large left-sided pleural effusion-underwent thoracocentesis-workup was suggestive of an exudate-and transferred to [facility] for pulmonary evaluation.   Significant events: 10/6>> admit to [facility]   Significant studies: 10/6>> CTA chest: No PE, extensive symmetric groundglass/consolidative changes-large left pleural effusion.  Stay able enhancing lesions in the liver bilaterally. 10/7>> echo: EF 65-70% 10/14>> CT chest: Small to moderate loculated left pleural effusion-decrease in size compared to prior study.   Significant microbiology/pathology data: 10/6>> COVID/influenza/RSV PCR: Negative 10/6>> blood culture: No growth 10/7>> pleural fluid culture: No growth 10/7>> pleural fluid cytology: No malignancy   Procedures: 10/7>> ultrasound-guided thoracocentesis (WBC 1332-60% neutrophils, LDH 278) 10/11>> chest tube placement by PCCM   Consults: PCCM    Brief Hospital Course: Severe sepsis secondary to multifocal pneumonia  with possible parapneumonic effusion Acute on chronic hypoxic respiratory failure secondary to PNA-pleural effusion Hypoxia is gradually improved with supportive care/thoracocentesis/chest tube tube placement/antibiotics Back on her usual home regimen of oxygen of 2-3 L.   Exudative pleural effusion Unclear whether this is parapneumonic or related to malignancy Pleural fluid cultures and cytology negative so far PCCM placed chest tube on 10/11-with tentative plans to start lytic therapy-however unfortunately chest tube was dislodged on 10/12.  PCCM repeated a CT chest on 10/14-pleural effusion is very small-no further recommendations apart from extending antibiotic therapy from [facility] plans to replace chest tube.   PCP/oncology to continue to monitor closely in the outpatient setting.   PAF with RVR Sign rhythm Continue Coreg Cardizem discontinued on 10/10 Eliquis held for chest tube placement-placed on IV heparin-has been transitioned back to Eliquis.  HTN BP on the soft side-per patient this is chronic Continue Coreg Continue midodrine Follow/optimize.   Hypomagnesemia Repleted   Chronic HFpEF Euvolemic BPs soft but stable-midodrine dosage escalated 10/9 Beta-blocker/Lasix   Aortic stenosis chronic issue. Stable for outpatient follow-up   Normocytic anemia Likely due to underlying malignancy No evidence of blood loss Follow CBC   Metastatic adenocarcinoma of the lung Follow-up with oncology in the outpatient setting   History of unprovoked pulmonary embolism Continue indefinite anticoagulation.   Morbid Obesity: Estimated body mass index is 40.24 kg/m as calculated from the following:   Height as of this encounter: 5\' 2"  (1.575 m).   Weight as of this encounter: 99.8 kg.     Discharge Diagnoses:  Principal Problem:   Acute on chronic respiratory failure with hypoxia (  PORT 1 VIEW  Result Date: 12/28/2022 CLINICAL DATA:  Shortness of breath EXAM: PORTABLE CHEST 1 VIEW COMPARISON:  12/26/2022 FINDINGS: Single frontal view of the chest demonstrates a stable cardiac silhouette. Stable chronic consolidation and volume loss within the left hemithorax consistent with post therapeutic changes in a patient with a known history of lung cancer. Stable bilateral pleural effusions, left greater than right. No new consolidation. No pneumothorax. Stable right chest wall port. IMPRESSION: 1. Stable bilateral pleural effusions, left greater than right. 2. Stable chronic consolidation within the left lung consistent with post therapeutic changes in a patient with a history of lung cancer. Electronically Signed   By: Sharlet Salina M.D.   On: 12/28/2022 09:11   DG CHEST PORT 1 VIEW  Result Date:  12/26/2022 CLINICAL DATA:  Pleural effusion. EXAM: PORTABLE CHEST 1 VIEW COMPARISON:  Chest radiograph 12/25/2022 and CT 12/24/2022 FINDINGS: A right jugular Port-A-Cath remains in place, terminating over the mid SVC. The left heart border remains obscured. Aortic atherosclerosis is noted. There is a persistent moderate left pleural effusion with apparent mild differences compared to yesterday's radiograph potentially due to differences in patient positioning. Left suprahilar lung opacity has not significantly changed in the setting of previous radiation for lung cancer. Left lower lung airspace opacities are stable to mildly improved. Hazy and patchy airspace opacities throughout the right lung have not significantly changed, and there is likely a persistent small right pleural effusion. No pneumothorax is identified. IMPRESSION: 1. Persistent moderate left pleural effusion with stable to mildly improved left lung aeration. 2. Unchanged right lung infiltrates. Electronically Signed   By: Sebastian Ache M.D.   On: 12/26/2022 11:12   ECHOCARDIOGRAM LIMITED  Result Date: 12/25/2022    ECHOCARDIOGRAM LIMITED REPORT   Patient Name:   Denise [name] Date of Exam: 12/25/2022 Medical Rec #:  [id number]   Height:       62.0 in Accession #:    [id number]  Weight:       220.0 lb Date of Birth:  1950-09-07    BSA:          1.991 m Patient Age:    72 years    BP:           106/62 mmHg Patient Gender: F           HR:           112 bpm. [facility] Procedure: Limited Echo Indications:    Atrial Fibrillation I48.91  History:        Patient has prior history of Echocardiogram examinations, most                 recent 07/19/2022. Risk Factors:Hypertension, Diabetes and Former                 Smoker.  Sonographer:    Celesta Gentile RCS Referring Phys: [phone] Heloise Beecham EMOKPAE IMPRESSIONS  1. Limited study.  2. Left ventricular ejection fraction, by estimation, is 65 to 70%. The left ventricle has normal function. The left  ventricle has no regional wall motion abnormalities. There is moderate concentric left ventricular hypertrophy. There is the interventricular septum is flattened in systole and diastole, consistent with right ventricular pressure and volume overload.  3. Right ventricular systolic function is normal. The right ventricular size is mildly enlarged.  4. A small pericardial effusion is present. The pericardial effusion is circumferential.  5. The mitral valve is degenerative. Moderate mitral annular calcification.  6. The

## 2023-01-02 NOTE — TOC CM/SW Note (Addendum)
    Durable Medical Equipment  (From admission, onward)           Start     Ordered   01/02/23 0927  For home use only DME Hospital bed  Once       Question Answer Comment  Length of Need Lifetime   Patient has (list medical condition): metastatic lung cancer ; respiratory failure   The above medical condition requires: Patient requires the ability to reposition frequently   Head must be elevated greater than: 30 degrees   Bed type Semi-electric   Hoyer Lift Yes      01/02/23 1610

## 2023-01-02 NOTE — Progress Notes (Signed)
PHARMACY - ANTICOAGULATION CONSULT NOTE  Pharmacy Consult for heparin Indication: atrial fibrillation  No Known Allergies  Patient Measurements: Height: 5\' 2"  (157.5 cm) Weight: 99.8 kg (220 lb) (from 12/20/22) IBW/kg (Calculated) : 50.1 Heparin Dosing Weight: 73.8 kg  Vital Signs: Temp: 98.1 F (36.7 C) (10/15 0740) Temp Source: Oral (10/15 0740) BP: 102/63 (10/15 0740) Pulse Rate: 84 (10/15 0740)  Labs: Recent Labs    12/31/22 0218 12/31/22 1154 12/31/22 2246 01/01/23 0436 01/01/23 1812  HGB 7.8*  --   --  7.6*  --   HCT 26.3*  --   --  25.9*  --   PLT 368  --   --  403*  --   APTT 51*   < > 101* 52* 60*  HEPARINUNFRC 1.03*  --  0.95* 0.84* 0.75*  CREATININE 0.89  --   --  1.20*  --    < > = values in this interval not displayed.    Estimated Creatinine Clearance: 47.5 mL/min (A) (by C-G formula based on SCr of 1.2 mg/dL (H)).   Medical History: Past Medical History:  Diagnosis Date   Anemia    Aortic stenosis    Arthritis    Coronary artery calcification seen on CT scan    Dyspnea    Essential hypertension    GERD (gastroesophageal reflux disease)    History of lung cancer    Stage III adenocarcinoma status post chemoradiation   Port-A-Cath in place 01/06/2019   Type 2 diabetes mellitus (HCC)    Assessment: 72YOF with PMH of metastatic/stage IV adenocarcinoma of the lung, chronic toxic respiratory failure on 2 L of oxygen, PE, DM-2, HTN, aortic stenosis now with new onset afib with RVR. Started on Eliquis during admission and held d/t chest tube placement 10/11. (last dose Eliquis 10/10 ~9AM). Chest tube dislodged, then removed 10/12. Pharmacy consulted to dose heparin.  No plan for chest tube replacement so ok to resume apixaban today. Hgb 7s, plt wnl.   Goal of Therapy:  Monitor platelets by anticoagulation protocol: Yes   Plan:  Dc heparin Resume apixaban 5mg  PO BID Rx will monitor peripherally  Ulyses Southward, PharmD, BCIDP, AAHIVP, CPP Infectious  Disease Pharmacist 01/02/2023 7:48 AM

## 2023-01-02 NOTE — TOC Transition Note (Signed)
Transition of Care [facility]) - CM/SW Discharge Note   Patient Details  Name: Denise [name] MRN: [id number] Date of Birth: Jul 25, 1950  [facility]) CM/SW Contact:  Gordy Clement, RN Phone Number: 01/02/2023, 9:29 AM   Clinical Narrative:     Patient will DC to home today. Hospital bed has been recommended and ordered thru [employer]. Will be delivered to home. Patient agreed that delivery can be after she gets . No Home Health has been recommended.   Niece will transport after a 1:00 appointment she as.  No additional TOC needs     Final next level of care: Home/Self Care Barriers to Discharge: Continued Medical Work up   Patient Goals and CMS Choice      Discharge Placement                         Discharge Plan and Services Additional resources added to the After Visit Summary for                                       Social Determinants of Health (SDOH) Interventions SDOH Screenings   Food Insecurity: No Food Insecurity (12/25/2022)  Housing: Patient Declined (12/25/2022)  Transportation Needs: No Transportation Needs (12/25/2022)  Utilities: Not At Risk (12/25/2022)  Financial Resource Strain: Low Risk  (12/06/2018)  Physical Activity: Inactive (12/06/2018)  Social Connections: Moderately Isolated (12/06/2018)  Stress: No Stress Concern Present (12/09/2018)   Received from [facility]  Tobacco Use: Medium Risk (12/25/2022)  Health Literacy: Low Risk  (01/01/2022)   Received from [facility]     Readmission Risk Interventions    08/09/2021    1:08 PM  Readmission Risk Prevention Plan  Transportation Screening Complete  PCP or Specialist Appt within 3-5 Days Complete  HRI or Home Care Consult Complete  Social Work Consult for Recovery Care Planning/Counseling Complete  Palliative Care Screening Not Applicable  Medication Review Oceanographer) Complete

## 2023-01-03 ENCOUNTER — Ambulatory Visit (HOSPITAL_COMMUNITY): Payer: Medicare HMO

## 2023-01-08 DIAGNOSIS — C3492 Malignant neoplasm of unspecified part of left bronchus or lung: Secondary | ICD-10-CM | POA: Diagnosis not present

## 2023-01-08 DIAGNOSIS — Z9981 Dependence on supplemental oxygen: Secondary | ICD-10-CM | POA: Diagnosis not present

## 2023-01-08 DIAGNOSIS — R0609 Other forms of dyspnea: Secondary | ICD-10-CM | POA: Diagnosis not present

## 2023-01-08 DIAGNOSIS — Z7901 Long term (current) use of anticoagulants: Secondary | ICD-10-CM | POA: Diagnosis not present

## 2023-01-08 DIAGNOSIS — C7931 Secondary malignant neoplasm of brain: Secondary | ICD-10-CM | POA: Diagnosis not present

## 2023-01-08 DIAGNOSIS — I959 Hypotension, unspecified: Secondary | ICD-10-CM | POA: Diagnosis not present

## 2023-01-08 DIAGNOSIS — J189 Pneumonia, unspecified organism: Secondary | ICD-10-CM | POA: Diagnosis not present

## 2023-01-08 DIAGNOSIS — J90 Pleural effusion, not elsewhere classified: Secondary | ICD-10-CM | POA: Diagnosis not present

## 2023-01-10 ENCOUNTER — Inpatient Hospital Stay: Payer: Medicare HMO

## 2023-01-10 ENCOUNTER — Inpatient Hospital Stay: Payer: Medicare HMO | Admitting: Hematology

## 2023-01-11 ENCOUNTER — Inpatient Hospital Stay (HOSPITAL_COMMUNITY)
Admission: EM | Admit: 2023-01-11 | Discharge: 2023-01-15 | DRG: 392 | Disposition: A | Discharge status: Home Health | Payer: Medicare HMO | Attending: Family Medicine | Admitting: Family Medicine

## 2023-01-11 ENCOUNTER — Encounter (HOSPITAL_COMMUNITY): Payer: Self-pay

## 2023-01-11 ENCOUNTER — Emergency Department (HOSPITAL_COMMUNITY): Payer: Medicare HMO

## 2023-01-11 ENCOUNTER — Other Ambulatory Visit: Payer: Self-pay

## 2023-01-11 DIAGNOSIS — K219 Gastro-esophageal reflux disease without esophagitis: Secondary | ICD-10-CM | POA: Diagnosis present

## 2023-01-11 DIAGNOSIS — Z86711 Personal history of pulmonary embolism: Secondary | ICD-10-CM

## 2023-01-11 DIAGNOSIS — R059 Cough, unspecified: Secondary | ICD-10-CM | POA: Diagnosis not present

## 2023-01-11 DIAGNOSIS — R1111 Vomiting without nausea: Secondary | ICD-10-CM | POA: Diagnosis not present

## 2023-01-11 DIAGNOSIS — Z85118 Personal history of other malignant neoplasm of bronchus and lung: Secondary | ICD-10-CM

## 2023-01-11 DIAGNOSIS — I878 Other specified disorders of veins: Secondary | ICD-10-CM | POA: Diagnosis present

## 2023-01-11 DIAGNOSIS — Z8249 Family history of ischemic heart disease and other diseases of the circulatory system: Secondary | ICD-10-CM

## 2023-01-11 DIAGNOSIS — R1084 Generalized abdominal pain: Secondary | ICD-10-CM | POA: Diagnosis not present

## 2023-01-11 DIAGNOSIS — Z87891 Personal history of nicotine dependence: Secondary | ICD-10-CM

## 2023-01-11 DIAGNOSIS — I350 Nonrheumatic aortic (valve) stenosis: Secondary | ICD-10-CM | POA: Diagnosis present

## 2023-01-11 DIAGNOSIS — Z923 Personal history of irradiation: Secondary | ICD-10-CM

## 2023-01-11 DIAGNOSIS — E66813 Obesity, class 3: Secondary | ICD-10-CM | POA: Diagnosis not present

## 2023-01-11 DIAGNOSIS — I700 Atherosclerosis of aorta: Secondary | ICD-10-CM | POA: Diagnosis not present

## 2023-01-11 DIAGNOSIS — C787 Secondary malignant neoplasm of liver and intrahepatic bile duct: Secondary | ICD-10-CM | POA: Diagnosis present

## 2023-01-11 DIAGNOSIS — R197 Diarrhea, unspecified: Secondary | ICD-10-CM | POA: Diagnosis not present

## 2023-01-11 DIAGNOSIS — E119 Type 2 diabetes mellitus without complications: Secondary | ICD-10-CM

## 2023-01-11 DIAGNOSIS — Z803 Family history of malignant neoplasm of breast: Secondary | ICD-10-CM

## 2023-01-11 DIAGNOSIS — Z9884 Bariatric surgery status: Secondary | ICD-10-CM

## 2023-01-11 DIAGNOSIS — Z7901 Long term (current) use of anticoagulants: Secondary | ICD-10-CM | POA: Diagnosis not present

## 2023-01-11 DIAGNOSIS — K529 Noninfective gastroenteritis and colitis, unspecified: Principal | ICD-10-CM

## 2023-01-11 DIAGNOSIS — I10 Essential (primary) hypertension: Secondary | ICD-10-CM

## 2023-01-11 DIAGNOSIS — Z9049 Acquired absence of other specified parts of digestive tract: Secondary | ICD-10-CM

## 2023-01-11 DIAGNOSIS — C3490 Malignant neoplasm of unspecified part of unspecified bronchus or lung: Secondary | ICD-10-CM

## 2023-01-11 DIAGNOSIS — D630 Anemia in neoplastic disease: Secondary | ICD-10-CM | POA: Diagnosis not present

## 2023-01-11 DIAGNOSIS — Z452 Encounter for adjustment and management of vascular access device: Secondary | ICD-10-CM | POA: Diagnosis not present

## 2023-01-11 DIAGNOSIS — I480 Paroxysmal atrial fibrillation: Secondary | ICD-10-CM | POA: Diagnosis present

## 2023-01-11 DIAGNOSIS — Z833 Family history of diabetes mellitus: Secondary | ICD-10-CM

## 2023-01-11 DIAGNOSIS — I4891 Unspecified atrial fibrillation: Secondary | ICD-10-CM

## 2023-01-11 DIAGNOSIS — Z96641 Presence of right artificial hip joint: Secondary | ICD-10-CM | POA: Diagnosis present

## 2023-01-11 DIAGNOSIS — E876 Hypokalemia: Secondary | ICD-10-CM | POA: Diagnosis present

## 2023-01-11 DIAGNOSIS — Z79899 Other long term (current) drug therapy: Secondary | ICD-10-CM | POA: Diagnosis not present

## 2023-01-11 DIAGNOSIS — Z6841 Body Mass Index (BMI) 40.0 and over, adult: Secondary | ICD-10-CM | POA: Diagnosis not present

## 2023-01-11 DIAGNOSIS — C3492 Malignant neoplasm of unspecified part of left bronchus or lung: Secondary | ICD-10-CM | POA: Diagnosis not present

## 2023-01-11 DIAGNOSIS — J9611 Chronic respiratory failure with hypoxia: Secondary | ICD-10-CM | POA: Diagnosis present

## 2023-01-11 DIAGNOSIS — K5730 Diverticulosis of large intestine without perforation or abscess without bleeding: Secondary | ICD-10-CM | POA: Diagnosis not present

## 2023-01-11 DIAGNOSIS — I5032 Chronic diastolic (congestive) heart failure: Secondary | ICD-10-CM | POA: Diagnosis present

## 2023-01-11 DIAGNOSIS — D649 Anemia, unspecified: Secondary | ICD-10-CM | POA: Diagnosis not present

## 2023-01-11 DIAGNOSIS — E1159 Type 2 diabetes mellitus with other circulatory complications: Secondary | ICD-10-CM | POA: Diagnosis not present

## 2023-01-11 DIAGNOSIS — Z90710 Acquired absence of both cervix and uterus: Secondary | ICD-10-CM | POA: Diagnosis not present

## 2023-01-11 DIAGNOSIS — I499 Cardiac arrhythmia, unspecified: Secondary | ICD-10-CM | POA: Diagnosis not present

## 2023-01-11 DIAGNOSIS — R54 Age-related physical debility: Secondary | ICD-10-CM | POA: Diagnosis present

## 2023-01-11 DIAGNOSIS — R160 Hepatomegaly, not elsewhere classified: Secondary | ICD-10-CM | POA: Diagnosis not present

## 2023-01-11 DIAGNOSIS — Z820 Family history of epilepsy and other diseases of the nervous system: Secondary | ICD-10-CM

## 2023-01-11 DIAGNOSIS — J90 Pleural effusion, not elsewhere classified: Secondary | ICD-10-CM | POA: Diagnosis not present

## 2023-01-11 DIAGNOSIS — Z96612 Presence of left artificial shoulder joint: Secondary | ICD-10-CM | POA: Diagnosis present

## 2023-01-11 DIAGNOSIS — Z808 Family history of malignant neoplasm of other organs or systems: Secondary | ICD-10-CM

## 2023-01-11 DIAGNOSIS — Z9889 Other specified postprocedural states: Secondary | ICD-10-CM

## 2023-01-11 DIAGNOSIS — Z8261 Family history of arthritis: Secondary | ICD-10-CM

## 2023-01-11 DIAGNOSIS — R110 Nausea: Secondary | ICD-10-CM

## 2023-01-11 DIAGNOSIS — I110 Hypertensive heart disease with heart failure: Secondary | ICD-10-CM | POA: Diagnosis not present

## 2023-01-11 DIAGNOSIS — R000 Tachycardia, unspecified: Secondary | ICD-10-CM | POA: Diagnosis not present

## 2023-01-11 DIAGNOSIS — I2510 Atherosclerotic heart disease of native coronary artery without angina pectoris: Secondary | ICD-10-CM | POA: Diagnosis present

## 2023-01-11 DIAGNOSIS — Z9981 Dependence on supplemental oxygen: Secondary | ICD-10-CM

## 2023-01-11 DIAGNOSIS — I959 Hypotension, unspecified: Secondary | ICD-10-CM | POA: Diagnosis not present

## 2023-01-11 DIAGNOSIS — Z825 Family history of asthma and other chronic lower respiratory diseases: Secondary | ICD-10-CM

## 2023-01-11 DIAGNOSIS — Z9221 Personal history of antineoplastic chemotherapy: Secondary | ICD-10-CM

## 2023-01-11 LAB — COMPREHENSIVE METABOLIC PANEL
ALT: 12 U/L (ref 0–44)
AST: 19 U/L (ref 15–41)
Albumin: 2.2 g/dL — ABNORMAL LOW (ref 3.5–5.0)
Alkaline Phosphatase: 94 U/L (ref 38–126)
Anion gap: 12 (ref 5–15)
BUN: 9 mg/dL (ref 8–23)
CO2: 25 mmol/L (ref 22–32)
Calcium: 8.4 mg/dL — ABNORMAL LOW (ref 8.9–10.3)
Chloride: 104 mmol/L (ref 98–111)
Creatinine, Ser: 0.96 mg/dL (ref 0.44–1.00)
GFR, Estimated: >60 mL/min (ref >60)
Glucose, Bld: 112 mg/dL — ABNORMAL HIGH (ref 70–99)
Potassium: 3.4 mmol/L — ABNORMAL LOW (ref 3.5–5.1)
Sodium: 141 mmol/L (ref 135–145)
Total Bilirubin: 0.4 mg/dL (ref 0.3–1.2)
Total Protein: 6.2 g/dL — ABNORMAL LOW (ref 6.5–8.1)

## 2023-01-11 LAB — URINALYSIS, ROUTINE W REFLEX MICROSCOPIC
Bilirubin Urine: NEGATIVE
Glucose, UA: NEGATIVE mg/dL
Hgb urine dipstick: NEGATIVE
Ketones, ur: NEGATIVE mg/dL
Leukocytes,Ua: NEGATIVE
Nitrite: NEGATIVE
Protein, ur: NEGATIVE mg/dL
Specific Gravity, Urine: 1.024 (ref 1.005–1.030)
pH: 5 (ref 5.0–8.0)

## 2023-01-11 LAB — CBC
HCT: 30 % — ABNORMAL LOW (ref 36.0–46.0)
Hemoglobin: 8.8 g/dL — ABNORMAL LOW (ref 12.0–15.0)
MCH: 28.9 pg (ref 26.0–34.0)
MCHC: 29.3 g/dL — ABNORMAL LOW (ref 30.0–36.0)
MCV: 98.4 fL (ref 80.0–100.0)
Platelets: 477 10*3/uL — ABNORMAL HIGH (ref 150–400)
RBC: 3.05 MIL/uL — ABNORMAL LOW (ref 3.87–5.11)
RDW: 16.1 % — ABNORMAL HIGH (ref 11.5–15.5)
WBC: 7.4 10*3/uL (ref 4.0–10.5)
nRBC: 0.3 % — ABNORMAL HIGH (ref 0.0–0.2)

## 2023-01-11 LAB — LIPASE, BLOOD: Lipase: 22 U/L (ref 11–51)

## 2023-01-11 MED ORDER — ALBUTEROL SULFATE (2.5 MG/3ML) 0.083% IN NEBU: 2.5000 mg | INHALATION_SOLUTION | RESPIRATORY_TRACT | Status: DC | PRN

## 2023-01-11 MED ORDER — IOHEXOL 300 MG/ML  SOLN
100.0000 mL | Freq: Once | INTRAMUSCULAR | Status: AC | PRN
  Administered 2023-01-11: 100 mL via INTRAVENOUS

## 2023-01-11 MED ORDER — HYDRALAZINE HCL 20 MG/ML IJ SOLN: 5.0000 mg | Freq: Four times a day (QID) | INTRAMUSCULAR | Status: DC | PRN

## 2023-01-11 MED ORDER — GABAPENTIN 400 MG PO CAPS: 1200.0000 mg | ORAL_CAPSULE | Freq: Every day | ORAL | Status: DC

## 2023-01-11 MED ORDER — ACETAMINOPHEN 325 MG PO TABS
650.0000 mg | ORAL_TABLET | Freq: Four times a day (QID) | ORAL | Status: DC | PRN
  Administered 2023-01-11 – 2023-01-13 (×3): 650 mg via ORAL
  Filled 2023-01-11 (×3): qty 2

## 2023-01-11 MED ORDER — POTASSIUM CHLORIDE CRYS ER 20 MEQ PO TBCR
20.0000 meq | EXTENDED_RELEASE_TABLET | Freq: Every day | ORAL | Status: DC
Start: 2023-01-11 — End: 2023-01-12
  Administered 2023-01-11: 20 meq via ORAL
  Filled 2023-01-11 (×2): qty 1

## 2023-01-11 MED ORDER — MORPHINE SULFATE (PF) 2 MG/ML IV SOLN
1.0000 mg | INTRAVENOUS | Status: DC | PRN
  Administered 2023-01-12 – 2023-01-15 (×2): 1 mg via INTRAVENOUS
  Filled 2023-01-11 (×2): qty 1

## 2023-01-11 MED ORDER — SODIUM CHLORIDE 0.9 % IV SOLN
2.0000 g | Freq: Once | INTRAVENOUS | Status: AC
  Administered 2023-01-11: 2 g via INTRAVENOUS
  Filled 2023-01-11: qty 20

## 2023-01-11 MED ORDER — MORPHINE SULFATE (PF) 4 MG/ML IV SOLN
4.0000 mg | Freq: Once | INTRAVENOUS | Status: AC
  Administered 2023-01-11: 4 mg via INTRAVENOUS
  Filled 2023-01-11: qty 1

## 2023-01-11 MED ORDER — FERROUS SULFATE 325 (65 FE) MG PO TABS
325.0000 mg | ORAL_TABLET | Freq: Every evening | ORAL | Status: DC
  Administered 2023-01-12 – 2023-01-14 (×3): 325 mg via ORAL
  Filled 2023-01-11 (×3): qty 1

## 2023-01-11 MED ORDER — ACETAMINOPHEN 650 MG RE SUPP: 650.0000 mg | Freq: Four times a day (QID) | RECTAL | Status: DC | PRN

## 2023-01-11 MED ORDER — PANTOPRAZOLE SODIUM 40 MG PO TBEC
40.0000 mg | DELAYED_RELEASE_TABLET | Freq: Every day | ORAL | Status: DC
  Administered 2023-01-11 – 2023-01-15 (×5): 40 mg via ORAL
  Filled 2023-01-11 (×5): qty 1

## 2023-01-11 MED ORDER — GABAPENTIN 300 MG PO CAPS
300.0000 mg | ORAL_CAPSULE | Freq: Every day | ORAL | Status: DC
  Filled 2023-01-11: qty 1

## 2023-01-11 MED ORDER — FUROSEMIDE 40 MG PO TABS
40.0000 mg | ORAL_TABLET | Freq: Every day | ORAL | Status: DC
  Administered 2023-01-12 – 2023-01-13 (×2): 40 mg via ORAL
  Filled 2023-01-11 (×2): qty 1

## 2023-01-11 MED ORDER — HYDROCODONE-ACETAMINOPHEN 5-325 MG PO TABS
1.0000 | ORAL_TABLET | Freq: Four times a day (QID) | ORAL | Status: DC | PRN
  Administered 2023-01-12 – 2023-01-14 (×3): 1 via ORAL
  Filled 2023-01-11 (×4): qty 1

## 2023-01-11 MED ORDER — CARVEDILOL 3.125 MG PO TABS
3.1250 mg | ORAL_TABLET | Freq: Two times a day (BID) | ORAL | Status: DC
  Administered 2023-01-12: 3.125 mg via ORAL
  Filled 2023-01-11: qty 1

## 2023-01-11 MED ORDER — FOLIC ACID 1 MG PO TABS
1.0000 mg | ORAL_TABLET | Freq: Every day | ORAL | Status: DC
Start: 2023-01-12 — End: 2023-01-15
  Administered 2023-01-12 – 2023-01-15 (×4): 1 mg via ORAL
  Filled 2023-01-11 (×4): qty 1

## 2023-01-11 MED ORDER — MIDODRINE HCL 5 MG PO TABS
10.0000 mg | ORAL_TABLET | Freq: Three times a day (TID) | ORAL | Status: DC
  Administered 2023-01-12 – 2023-01-15 (×11): 10 mg via ORAL
  Filled 2023-01-11 (×11): qty 2

## 2023-01-11 MED ORDER — SODIUM CHLORIDE 0.9 % IV SOLN
2.0000 g | INTRAVENOUS | Status: DC
  Administered 2023-01-12 – 2023-01-14 (×3): 2 g via INTRAVENOUS
  Filled 2023-01-11 (×3): qty 20

## 2023-01-11 MED ORDER — APIXABAN 5 MG PO TABS
5.0000 mg | ORAL_TABLET | Freq: Two times a day (BID) | ORAL | Status: DC
  Administered 2023-01-11 – 2023-01-15 (×8): 5 mg via ORAL
  Filled 2023-01-11 (×8): qty 1

## 2023-01-11 MED ORDER — SODIUM CHLORIDE 0.9 % IV BOLUS
1000.0000 mL | Freq: Once | INTRAVENOUS | Status: AC
  Administered 2023-01-11: 1000 mL via INTRAVENOUS

## 2023-01-11 MED ORDER — ONDANSETRON HCL 4 MG PO TABS: 4.0000 mg | ORAL_TABLET | Freq: Four times a day (QID) | ORAL | Status: DC | PRN

## 2023-01-11 MED ORDER — VITAMIN B-12 1000 MCG PO TABS
2500.0000 ug | ORAL_TABLET | Freq: Every morning | ORAL | Status: DC
  Administered 2023-01-12 – 2023-01-15 (×4): 2500 ug via ORAL
  Filled 2023-01-11 (×4): qty 3

## 2023-01-11 MED ORDER — METRONIDAZOLE 500 MG/100ML IV SOLN
500.0000 mg | Freq: Once | INTRAVENOUS | Status: AC
  Administered 2023-01-11: 500 mg via INTRAVENOUS
  Filled 2023-01-11: qty 100

## 2023-01-11 MED ORDER — METRONIDAZOLE 500 MG/100ML IV SOLN
500.0000 mg | Freq: Two times a day (BID) | INTRAVENOUS | Status: DC
  Administered 2023-01-12 – 2023-01-15 (×7): 500 mg via INTRAVENOUS
  Filled 2023-01-11 (×8): qty 100

## 2023-01-11 MED ORDER — ONDANSETRON HCL 4 MG/2ML IJ SOLN
4.0000 mg | Freq: Once | INTRAMUSCULAR | Status: AC
  Administered 2023-01-11: 4 mg via INTRAVENOUS
  Filled 2023-01-11: qty 2

## 2023-01-11 MED ORDER — CALCIUM CARBONATE ANTACID 500 MG PO CHEW
400.0000 mg | CHEWABLE_TABLET | Freq: Every day | ORAL | Status: DC
  Administered 2023-01-12 – 2023-01-15 (×4): 400 mg via ORAL
  Filled 2023-01-11 (×4): qty 2

## 2023-01-11 MED ORDER — ONDANSETRON HCL 4 MG/2ML IJ SOLN
4.0000 mg | Freq: Four times a day (QID) | INTRAMUSCULAR | Status: DC | PRN
  Administered 2023-01-14: 4 mg via INTRAVENOUS
  Filled 2023-01-11: qty 2

## 2023-01-11 NOTE — ED Notes (Signed)
Gave report to Amanda RN

## 2023-01-11 NOTE — ED Notes (Signed)
Pt has had no BM's since arriving to the ED

## 2023-01-11 NOTE — H&P (Signed)
TRH H&P   Patient Demographics:    Denise [name], is a 72 y.o. female  MRN: [id number]   DOB - 11-May-1950  Admit Date - 01/11/2023  Outpatient Primary MD for the patient is Mirna Mires, MD  Referring MD/NP/PA: Dr Charm Barges  Outpatient Specialists: Oncology Dr. Ellin Saba  Patient coming from: Home  Chief Complaint  Patient presents with   Abdominal Pain      HPI:    Denise [name]  is a 72 y.o. female, with history of metastatic/stage IV adenocarcinoma of the lung, chronic toxic respiratory failure on 2 L of oxygen, PE, DM-2, HTN, aortic stenosis with recent hospitalization at [facility] for loculated pleural effusion, required chest tube insertion, did not receive any lytics as chest tube was dislodged prior and repeat CT chest showing stable effusion, and recommendation has been made for prolonged antibiotic course. -Patient presents to ED secondary to complaints of abdominal pain, started few days ago, as well she did report loose to watery BMs, 4-6 times per day, she does report some nausea, but denies any vomiting, she reports decreased oral intake, denies any melena, hematochezia, orts dyspnea at baseline, on home oxygen, she denies any chest pain, no cough, no fever, no chills. -ED CT abdomen pelvis showing stable liver metastasis, but showing new ascending and transverse colitis, labs were significant for white blood cell of 7.4, hemoglobin stable at 8.8, creatinine stable at 0.96, potassium low at 3.4, patient received IV Rocephin and Flagyl and [facility] hospitalist consulted to admit.   Review of systems:     A full 10 point Review of Systems was done, except as stated above, all other Review of Systems were negative.   With Past History of the following :    Past Medical History:  Diagnosis Date   Anemia    Aortic stenosis    Arthritis    Coronary artery  calcification seen on CT scan    Dyspnea    Essential hypertension    GERD (gastroesophageal reflux disease)    History of lung cancer    Stage III adenocarcinoma status post chemoradiation   Port-A-Cath in place 01/06/2019   Type 2 diabetes mellitus [facility])       Past Surgical History:  Procedure Laterality Date   BIOPSY  09/25/2022   Procedure: BIOPSY;  Surgeon: Lanelle Bal, DO;  [facility]: [facility];  Service: Endoscopy;;   CHOLECYSTECTOMY  1997   COLONOSCOPY     COLONOSCOPY WITH PROPOFOL N/A 09/25/2022   Procedure: COLONOSCOPY WITH PROPOFOL;  Surgeon: Lanelle Bal, DO;  Location: [facility];  Service: Endoscopy;  Laterality: N/A;  11:00;ASA 3   ESOPHAGEAL BRUSHING  09/25/2022   Procedure: ESOPHAGEAL BRUSHING;  Surgeon: Lanelle Bal, DO;  [facility]: [facility];  Service: Endoscopy;;   ESOPHAGOGASTRODUODENOSCOPY (EGD) WITH PROPOFOL N/A 09/25/2022   Procedure: ESOPHAGOGASTRODUODENOSCOPY (EGD) WITH PROPOFOL;  Surgeon:  Lanelle Bal, DO;  Location: [facility];  Service: Endoscopy;  Laterality: N/A;  11:00AM;ASA 3   GASTRIC BYPASS     INCISIONAL HERNIA REPAIR  04/11/11   IR IMAGING GUIDED PORT INSERTION  12/27/2018   Right   IR US GUIDE BX ASP/DRAIN  08/08/2021   LAPAROSCOPIC SALPINGOOPHERECTOMY     LAPAROTOMY  04/11/2011   Procedure: EXPLORATORY LAPAROTOMY;  Surgeon: Clovis Pu. Cornett, MD;  Location: [facility];  Service: General;  Laterality: N/A;  closure port hole   POLYPECTOMY  09/25/2022   Procedure: POLYPECTOMY;  Surgeon: Lanelle Bal, DO;  Location: [facility];  Service: Endoscopy;;   RIGHT/LEFT HEART CATH AND CORONARY ANGIOGRAPHY N/A 12/29/2019   Procedure: RIGHT/LEFT HEART CATH AND CORONARY ANGIOGRAPHY;  Surgeon: Lennette Bihari, MD;  Location: [facility];  Service: Cardiovascular;  Laterality: N/A;   TOTAL HIP ARTHROPLASTY  03/07/2012   Procedure: TOTAL HIP ARTHROPLASTY ANTERIOR APPROACH;  Surgeon: Shelda Pal, MD;  Location: [facility];  Service:  Orthopedics;  Laterality: Right;   TOTAL SHOULDER ARTHROPLASTY Left 01/21/2015   TOTAL SHOULDER ARTHROPLASTY Left 01/21/2015   Procedure: LEFT TOTAL SHOULDER ARTHROPLASTY;  Surgeon: Francena Hanly, MD;  Location: [facility];  Service: Orthopedics;  Laterality: Left;   VAGINAL HYSTERECTOMY        Social History:     Social History   Tobacco Use   Smoking status: Former    Current packs/day: 0.00    Average packs/day: 1 pack/day for 12.0 years (12.0 ttl pk-yrs)    Types: Cigarettes    Start date: 03/20/1964    Quit date: 03/20/1976    Years since quitting: 46.8    Passive exposure: Past   Smokeless tobacco: Never  Substance Use Topics   Alcohol use: No        Family History :     Family History  Problem Relation Age of Onset   Breast cancer Mother    COPD Mother    Arthritis Mother    Diabetes Mother    Hypertension Mother    Hypertension Father    Diabetes Father    Breast cancer Sister    Thyroid cancer Brother    Heart attack Brother    Huntington's disease Maternal Grandmother      Home Medications:   Prior to Admission medications   Medication Sig Start Date End Date Taking? Authorizing Provider  acetaminophen (TYLENOL) 500 MG tablet Take 500 mg by mouth every 6 (six) hours as needed for moderate pain.    [provider]  albuterol (VENTOLIN HFA) 108 (90 Base) MCG/ACT inhaler Inhale 2 puffs into the lungs every 6 (six) hours as needed for wheezing or shortness of breath. 01/02/23   Ghimire, Werner Lean, MD  Calcium Carbonate Antacid (TUMS PO) Take 4 tablets by mouth daily.    [provider]  carvedilol (COREG) 3.125 MG tablet TAKE 1 TABLET BY MOUTH TWICE A DAY 09/29/22   Jonelle Sidle, MD  Cyanocobalamin (VITAMIN B-12) 2500 MCG SUBL Place 2,500 mcg under the tongue every morning.     [provider]  ELIQUIS 5 MG TABS tablet TAKE 1 TABLET BY MOUTH TWICE A DAY 12/25/22   Doreatha Massed, MD  ferrous sulfate 325 (65 FE) MG tablet Take  325 mg by mouth every evening.    [provider]  folic acid (FOLVITE) 1 MG tablet TAKE 1 TABLET BY MOUTH EVERY DAY 11/22/22   Doreatha Massed, MD  furosemide (LASIX) 40  MG tablet Take 1 tablet (40 mg total) by mouth daily. 01/02/23   Ghimire, Werner Lean, MD  gabapentin (NEURONTIN) 600 MG tablet Take 1,200 mg by mouth at bedtime. 11/22/22   [provider]  Glycerin-Hypromellose-PEG 400 (DRY EYE RELIEF DROPS OP) Apply 1 drop to eye daily as needed (dry eyes).    [provider]  midodrine (PROAMATINE) 10 MG tablet Take 1 tablet (10 mg total) by mouth 3 (three) times daily with meals. 01/02/23   Ghimire, Werner Lean, MD  Multiple Vitamin (MULITIVITAMIN WITH MINERALS) TABS Take 1 tablet by mouth daily.    [provider]  omeprazole (PRILOSEC) 20 MG capsule Take 20 mg by mouth daily. 11/18/22   [provider]  potassium chloride SA (KLOR-CON M) 20 MEQ tablet Take 20 mEq by mouth daily.    [provider]     Allergies:    No Known Allergies   Physical Exam:   Vitals  Blood pressure (!) 88/65, pulse 92, temperature 98.3 F (36.8 C), temperature source Oral, resp. rate (!) 21, height 5\' 2"  (1.575 m), weight 96.8 kg, SpO2 97%.   1. General well developed  female, laying in bed, in no apparent distress  2. Normal affect and insight, Not Suicidal or Homicidal, Awake Alert, Oriented X 3.  3. No F.N deficits, ALL C.Nerves Intact, Strength 5/5 all 4 extremities, Sensation intact all 4 extremities, Plantars down going.  4. Ears and Eyes appear Normal, Conjunctivae clear, PERRLA. Moist Oral Mucosa.  5. Supple Neck, No JVD, No cervical lymphadenopathy appriciated, No Carotid Bruits.  6. Symmetrical Chest wall movement, Good air movement bilaterally, CTAB.  7. RRR, No Gallops, Rubs or Murmurs, No Parasternal Heave.  8. Positive Bowel Sounds, Abdomen soft with mild tenderness to palpation, no organomegaly appriciated,No rebound -guarding or  rigidity.  9.  No Cyanosis, Normal Skin Turgor, No Skin Rash or Bruise.  10. Good muscle tone,  joints appear normal , no effusions, Normal ROM.    Data Review:    CBC Recent Labs  Lab 01/11/23 1309  WBC 7.4  HGB 8.8*  HCT 30.0*  PLT 477*  MCV 98.4  MCH 28.9  MCHC 29.3*  RDW 16.1*   ------------------------------------------------------------------------------------------------------------------  Chemistries  Recent Labs  Lab 01/11/23 1309  NA 141  K 3.4*  CL 104  CO2 25  GLUCOSE 112*  BUN 9  CREATININE 0.96  CALCIUM 8.4*  AST 19  ALT 12  ALKPHOS 94  BILITOT 0.4   ------------------------------------------------------------------------------------------------------------------ estimated creatinine clearance is 58.4 mL/min (by C-G formula based on SCr of 0.96 mg/dL). ------------------------------------------------------------------------------------------------------------------ No results for input(s): "TSH", "T4TOTAL", "T3FREE", "THYROIDAB" in the last 72 hours.  Invalid input(s): "FREET3"  Coagulation profile No results for input(s): "INR", "PROTIME" in the last 168 hours. ------------------------------------------------------------------------------------------------------------------- No results for input(s): "DDIMER" in the last 72 hours. -------------------------------------------------------------------------------------------------------------------  Cardiac Enzymes No results for input(s): "CKMB", "TROPONINI", "MYOGLOBIN" in the last 168 hours.  Invalid input(s): "CK" ------------------------------------------------------------------------------------------------------------------    Component Value Date/Time   BNP 343.0 (H) 12/24/2022 1657     ---------------------------------------------------------------------------------------------------------------  Urinalysis    Component Value Date/Time   COLORURINE YELLOW 01/11/2023 1629    APPEARANCEUR CLEAR 01/11/2023 1629   LABSPEC 1.024 01/11/2023 1629   PHURINE 5.0 01/11/2023 1629   GLUCOSEU NEGATIVE 01/11/2023 1629   HGBUR NEGATIVE 01/11/2023 1629   BILIRUBINUR NEGATIVE 01/11/2023 1629   KETONESUR NEGATIVE 01/11/2023 1629   PROTEINUR NEGATIVE 01/11/2023 1629   UROBILINOGEN 1.0 03/05/2012 1200   NITRITE NEGATIVE 01/11/2023  1629   LEUKOCYTESUR NEGATIVE 01/11/2023 1629    ----------------------------------------------------------------------------------------------------------------   Imaging Results:    CT ABDOMEN PELVIS W CONTRAST  Result Date: 01/11/2023 CLINICAL DATA:  Acute abdominal pain. History of lung cancer. * Tracking Code: BO * EXAM: CT ABDOMEN AND PELVIS WITH CONTRAST TECHNIQUE: Multidetector CT imaging of the abdomen and pelvis was performed using the standard protocol following bolus administration of intravenous contrast. RADIATION DOSE REDUCTION: This exam was performed according to the departmental dose-optimization program which includes automated exposure control, adjustment of the mA and/or kV according to patient size and/or use of iterative reconstruction technique. CONTRAST:  OMNIPAQUE IOHEXOL 300 MG/ML  SOLN COMPARISON:  CT chest 01/01/2023 and older. Abdomen pelvis CT 08/07/2022 and older FINDINGS: Lower chest: Increasing bilateral pleural effusions compared to 01/01/2023. The left is complex with a more pleural thickening. Few bubbles of air. Areas actually decreased from the prior examination. Heart is enlarged. Small pericardial effusion. Persistent patchy lung base opacities. Hepatobiliary: As seen previously there is a dilated biliary tree, similar to previous with prior cholecystectomy. There are some benign hepatic cystic foci such as segment 3, unchanged. However there is a aggressive lesion in the right hepatic lobe which previously was measured at 3.7 x 4.1 cm and today 4.1 x 3.5 cm, similar when adjusting for technique. There is a second  lesion more caudal in the right hepatic lobe on the previous examination measuring 3.2 x 3.1 cm which is less well identified but appears present on series 2, image 26 with dimension estimated at 2.5 x 2.4 cm. Focal fat deposition seen in the liver as well adjacent to the falciform ligament in segment 4. Patent portal vein. Pancreas: Mild pancreatic atrophy. Stable ectasia of the pancreatic duct in the head/neck region. Spleen: Normal in size without focal abnormality. Adrenals/Urinary Tract: Adrenal glands are preserved. No enhancing renal mass or collecting system dilatation. Preserved contours of the urinary bladder. Stomach/Bowel: There is new wall thickening with moderate stranding along the ascending colon and transverse colon consistent with a colitis. There is also wall thickening with stranding along the terminal ileum, component of enteritis. Scattered colonic diverticula surgical changes along the mid small bowel and stomach with a gastric bypass including gastrojejunostomy. The excluded portion of the stomach is decompressed. Vascular/Lymphatic: Aortic atherosclerosis. No enlarged abdominal or pelvic lymph nodes. Reproductive: Calcified fibroid in the pelvis. No separate adnexal mass Other: Scattered mesenteric stranding and some dependent free fluid in the pelvis, increased from previous. Rectus muscle diastasis again identified with anterior abdominal wall wide-mouth hernia involving mesenteric fat and bowel. No obstruction Musculoskeletal: Scattered degenerative changes of the spine and pelvis. Trace anterolisthesis of L4 on L5. Multilevel disc height loss with osteophytes. Disc bulging. Streak artifact related to patient's right hip arthroplasty. IMPRESSION: Interval development of wall thickening with stranding along the ascending and transverse colon consistent with a colitis. There also involvement of the terminal ileum. Small amount of dependent free fluid in the pelvis. No bowel obstruction.  Stable surgical changes of gastric bypass. Scattered colonic diverticula. Stable right hepatic lobe liver masses consistent with known metastatic lesions. Separate hepatic cystic lesions. Compared to the recent chest CT slight increase in bilateral pleural effusions. The air component on the left is decreased. The left appears more complex with pleural thickening. Please correlate with the history and recommend follow-up Electronically Signed   By: Karen Kays M.D.   On: 01/11/2023 17:32   DG Chest Port 1 View  Result Date: 01/11/2023 CLINICAL DATA:  Recent effusions. Nonproductive cough. Shortness of breath. EXAM: PORTABLE CHEST 1 VIEW COMPARISON:  Chest radiograph dated December 31, 2022. CT chest dated January 01, 2023. FINDINGS: The heart size and mediastinal contours are unchanged. Aortic atherosclerosis. Right chest port catheter tip at the level of the distal SVC. Similar size of a loculated left pleural effusion, better evaluated on the prior CT chest dated January 01, 2023, with basilar atelectasis. Persistent post treatment consolidative changes and architectural distortion in the perihilar left lung. Persistent small right pleural effusion. No pneumothorax. The visualized osseous structures are unchanged. IMPRESSION: 1. Similar loculated left pleural effusion and small right pleural effusion, better evaluated on the prior CT chest dated January 01, 2023. 2. Redemonstration of posttreatment parenchymal consolidation and architectural distortion in the left perihilar lung. Electronically Signed   By: Hart Robinsons M.D.   On: 01/11/2023 16:32     EKG:  PR interval 134 ms QRS duration 133 ms QT/QTcB 323/468 ms P-R-T axes 39 -88 -16 Sinus tachycardia RBBB and LAFB Non-specific ST-t changes Baseline wander  Assessment & Plan:    Principal Problem:   Colitis Active Problems:   Atrial fibrillation with RVR (HCC)   Type 2 diabetes mellitus (HCC)   Essential hypertension   Class 3  obesity   Adenocarcinoma of left lung (HCC)    Abd pain Ascending/transverse colon colitis -Presents with abdominal pain, nausea, imaging significant for ascending and transverse colon colitis -Continue with IV Rocephin, and Flagyl -will obtain C. difficile, GI panel  Paroxysmal A-fib -continue with Coreg for heart rate control, continue with Eliquis for anticoagulation  Hypertension -Continue with Coreg, mainly for heart rate control -Continue with midodrine   Chronic diastolic CHF -she appears to be a euvolemic, continue with home dose Lasix for now  Chronic respiratory failure with hypoxia Left loculated pleural effusion -He is at baseline on 2 L nasal cannula -Chest x-ray showing stable left little pleural effusion   Aortic stenosis chronic issue.   Normocytic anemia Likely due to underlying malignancy No evidence of blood loss Hemoglobin at baseline   Metastatic adenocarcinoma of the lung Follow-up with oncology in the outpatient setting   History of unprovoked pulmonary embolism Continue with anticoagulation  Obesity class II Body mass index is 39.03 kg/m.  DVT Prophylaxis Eliquis  AM Labs Ordered, also please review Full Orders  Family Communication: Admission, patients condition and plan of care including tests being ordered have been discussed with the patient  who indicate understanding and agree with the plan and Code Status.  Code Status Full  Likely DC to  home  Condition GUARDED    Consults called: none    Admission status: inpatient    Time spent in minutes : 70 minutes   Huey Bienenstock M.D on 01/11/2023 at 8:57 PM

## 2023-01-11 NOTE — ED Triage Notes (Signed)
Pt brought in by [facility] for c/o diarrhea x 4 days. EMS states pt described it as "coffee grounds" and approx 3 hours ago developed severe abdominal pain that has been constant. Pt was discharged from [facility] on 10/17 for acute respiratory failure with hypoxia.

## 2023-01-11 NOTE — ED Notes (Signed)
..ED TO INPATIENT HANDOFF REPORT  ED Nurse Name and Phone #: [phone]  S Name/Age/Gender Denise [name] 72 y.o. female Room/Bed: APA11/APA11  Code Status   Code Status: Full Code  Home/SNF/Other Home Patient oriented to: self, place, time, and situation Is this baseline? Yes   Triage Complete: Triage complete  Chief Complaint Colitis [K52.9]  Triage Note Pt brought in by [facility] for c/o diarrhea x 4 days. EMS states pt described it as "coffee grounds" and approx 3 hours ago developed severe abdominal pain that has been constant. Pt was discharged from [facility] on 10/17 for acute respiratory failure with hypoxia.    Allergies No Known Allergies  Level of Care/Admitting Diagnosis ED Disposition     ED Disposition  Admit   Condition  --   Comment  Hospital Area: [facility] [[id number]]  Level of Care: Telemetry [5]  Covid Evaluation: Asymptomatic - no recent exposure (last 10 days) testing not required  Diagnosis: Colitis [[id number]]  Admitting Physician: Chiquita Loth  Attending Physician: Randol Kern, DAWOOD S [[id number]]  Certification:: I certify this patient will need inpatient services for at least 2 midnights  Expected Medical Readiness: 01/14/2023          B Medical/Surgery History Past Medical History:  Diagnosis Date   Anemia    Aortic stenosis    Arthritis    Coronary artery calcification seen on CT scan    Dyspnea    Essential hypertension    GERD (gastroesophageal reflux disease)    History of lung cancer    Stage III adenocarcinoma status post chemoradiation   Port-A-Cath in place 01/06/2019   Type 2 diabetes mellitus (HCC)    Past Surgical History:  Procedure Laterality Date   BIOPSY  09/25/2022   Procedure: BIOPSY;  Surgeon: Lanelle Bal, DO;  [facility];  Service: Endoscopy;;   CHOLECYSTECTOMY  1997   COLONOSCOPY     COLONOSCOPY WITH PROPOFOL N/A 09/25/2022   Procedure: COLONOSCOPY WITH PROPOFOL;  Surgeon: Lanelle Bal, DO;  [facility]: [facility];  Service: Endoscopy;  Laterality: N/A;  11:00;ASA 3   ESOPHAGEAL BRUSHING  09/25/2022   Procedure: ESOPHAGEAL BRUSHING;  Surgeon: Lanelle Bal, DO;  [facility];  Service: Endoscopy;;   ESOPHAGOGASTRODUODENOSCOPY (EGD) WITH PROPOFOL N/A 09/25/2022   Procedure: ESOPHAGOGASTRODUODENOSCOPY (EGD) WITH PROPOFOL;  Surgeon: Lanelle Bal, DO;  [facility];  Service: Endoscopy;  Laterality: N/A;  11:00AM;ASA 3   GASTRIC BYPASS     INCISIONAL HERNIA REPAIR  04/11/11   IR IMAGING GUIDED PORT INSERTION  12/27/2018   Right   IR US GUIDE BX ASP/DRAIN  08/08/2021   LAPAROSCOPIC SALPINGOOPHERECTOMY     LAPAROTOMY  04/11/2011   Procedure: EXPLORATORY LAPAROTOMY;  Surgeon: Clovis Pu. Cornett, MD;  Location: [facility];  Service: General;  Laterality: N/A;  closure port hole   POLYPECTOMY  09/25/2022   Procedure: POLYPECTOMY;  Surgeon: Lanelle Bal, DO;  [facility];  Service: Endoscopy;;   RIGHT/LEFT HEART CATH AND CORONARY ANGIOGRAPHY N/A 12/29/2019   Procedure: RIGHT/LEFT HEART CATH AND CORONARY ANGIOGRAPHY;  Surgeon: Lennette Bihari, MD;  Location: [facility];  Service: Cardiovascular;  Laterality: N/A;   TOTAL HIP ARTHROPLASTY  03/07/2012   Procedure: TOTAL HIP ARTHROPLASTY ANTERIOR APPROACH;  Surgeon: Shelda Pal, MD;  Location: [facility];  Service: Orthopedics;  Laterality: Right;   TOTAL SHOULDER ARTHROPLASTY Left 01/21/2015   TOTAL SHOULDER ARTHROPLASTY Left 01/21/2015  Procedure: LEFT TOTAL SHOULDER ARTHROPLASTY;  Surgeon: Francena Hanly, MD;  Location: [facility];  Service: Orthopedics;  Laterality: Left;   VAGINAL HYSTERECTOMY       A IV Location/Drains/Wounds Patient Lines/Drains/Airways Status     Active Line/Drains/Airways     Name Placement date Placement time Site Days   Implanted Port 01/11/23 Right Chest 01/11/23  1300  Chest  less than 1   Wound / Incision (Open or Dehisced) 11/19/18 Puncture Back  Lateral;Left;Upper 11/19/18  1258  Back  1514            Intake/Output Last 24 hours  Intake/Output Summary (Last 24 hours) at 01/11/2023 1935 Last data filed at 01/11/2023 1859 Gross per 24 hour  Intake 100 ml  Output --  Net 100 ml    Labs/Imaging Results for orders placed or performed during the hospital encounter of 01/11/23 (from the past 48 hour(s))  CBC     Status: Abnormal   Collection Time: 01/11/23  1:09 PM  Result Value Ref Range   WBC 7.4 4.0 - 10.5 K/uL   RBC 3.05 (L) 3.87 - 5.11 MIL/uL   Hemoglobin 8.8 (L) 12.0 - 15.0 g/dL   HCT 29.5 (L) 62.1 - 30.8 %   MCV 98.4 80.0 - 100.0 fL   MCH 28.9 26.0 - 34.0 pg   MCHC 29.3 (L) 30.0 - 36.0 g/dL   RDW 65.7 (H) 84.6 - 96.2 %   Platelets 477 (H) 150 - 400 K/uL   nRBC 0.3 (H) 0.0 - 0.2 %    Comment: Performed at [facility], [address] [place], [place] [address]  Comprehensive metabolic panel     Status: Abnormal   Collection Time: 01/11/23  1:09 PM  Result Value Ref Range   Sodium 141 135 - 145 mmol/L   Potassium 3.4 (L) 3.5 - 5.1 mmol/L   Chloride 104 98 - 111 mmol/L   CO2 25 22 - 32 mmol/L   Glucose, Bld 112 (H) 70 - 99 mg/dL    Comment: Glucose reference range applies only to samples taken after fasting for at least 8 hours.   BUN 9 8 - 23 mg/dL   Creatinine, Ser 1.32 0.44 - 1.00 mg/dL   Calcium 8.4 (L) 8.9 - 10.3 mg/dL   Total Protein 6.2 (L) 6.5 - 8.1 g/dL   Albumin 2.2 (L) 3.5 - 5.0 g/dL   AST 19 15 - 41 U/L   ALT 12 0 - 44 U/L   Alkaline Phosphatase 94 38 - 126 U/L   Total Bilirubin 0.4 0.3 - 1.2 mg/dL   GFR, Estimated >44 >01 mL/min    Comment: (NOTE) Calculated using the CKD-EPI Creatinine Equation (2021)    Anion gap 12 5 - 15    Comment: Performed at [facility], [address], [place], [place] [address]  Lipase, blood     Status: None   Collection Time: 01/11/23  1:09 PM  Result Value Ref Range   Lipase 22 11 - 51 U/L    Comment: Performed at [facility], [address],  [place], [place] [address]  Urinalysis, Routine w reflex microscopic -Urine, Clean Catch     Status: None   Collection Time: 01/11/23  4:29 PM  Result Value Ref Range   Color, Urine YELLOW YELLOW   APPearance CLEAR CLEAR   Specific Gravity, Urine 1.024 1.005 - 1.030   pH 5.0 5.0 - 8.0   Glucose, UA NEGATIVE NEGATIVE mg/dL   Hgb urine dipstick NEGATIVE NEGATIVE  Bilirubin Urine NEGATIVE NEGATIVE   Ketones, ur NEGATIVE NEGATIVE mg/dL   Protein, ur NEGATIVE NEGATIVE mg/dL   Nitrite NEGATIVE NEGATIVE   Leukocytes,Ua NEGATIVE NEGATIVE    Comment: Performed at [facility], [address] [place], [place] [address]   CT ABDOMEN PELVIS W CONTRAST  Result Date: 01/11/2023 CLINICAL DATA:  Acute abdominal pain. History of lung cancer. * Tracking Code: BO * EXAM: CT ABDOMEN AND PELVIS WITH CONTRAST TECHNIQUE: Multidetector CT imaging of the abdomen and pelvis was performed using the standard protocol following bolus administration of intravenous contrast. RADIATION DOSE REDUCTION: This exam was performed according to the departmental dose-optimization program which includes automated exposure control, adjustment of the mA and/or kV according to patient size and/or use of iterative reconstruction technique. CONTRAST:  OMNIPAQUE IOHEXOL 300 MG/ML  SOLN COMPARISON:  CT chest 01/01/2023 and older. Abdomen pelvis CT 08/07/2022 and older FINDINGS: Lower chest: Increasing bilateral pleural effusions compared to 01/01/2023. The left is complex with a more pleural thickening. Few bubbles of air. Areas actually decreased from the prior examination. Heart is enlarged. Small pericardial effusion. Persistent patchy lung base opacities. Hepatobiliary: As seen previously there is a dilated biliary tree, similar to previous with prior cholecystectomy. There are some benign hepatic cystic foci such as segment 3, unchanged. However there is a aggressive lesion in the right hepatic lobe which previously was measured at 3.7 x  4.1 cm and today 4.1 x 3.5 cm, similar when adjusting for technique. There is a second lesion more caudal in the right hepatic lobe on the previous examination measuring 3.2 x 3.1 cm which is less well identified but appears present on series 2, image 26 with dimension estimated at 2.5 x 2.4 cm. Focal fat deposition seen in the liver as well adjacent to the falciform ligament in segment 4. Patent portal vein. Pancreas: Mild pancreatic atrophy. Stable ectasia of the pancreatic duct in the head/neck region. Spleen: Normal in size without focal abnormality. Adrenals/Urinary Tract: Adrenal glands are preserved. No enhancing renal mass or collecting system dilatation. Preserved contours of the urinary bladder. Stomach/Bowel: There is new wall thickening with moderate stranding along the ascending colon and transverse colon consistent with a colitis. There is also wall thickening with stranding along the terminal ileum, component of enteritis. Scattered colonic diverticula surgical changes along the mid small bowel and stomach with a gastric bypass including gastrojejunostomy. The excluded portion of the stomach is decompressed. Vascular/Lymphatic: Aortic atherosclerosis. No enlarged abdominal or pelvic lymph nodes. Reproductive: Calcified fibroid in the pelvis. No separate adnexal mass Other: Scattered mesenteric stranding and some dependent free fluid in the pelvis, increased from previous. Rectus muscle diastasis again identified with anterior abdominal wall wide-mouth hernia involving mesenteric fat and bowel. No obstruction Musculoskeletal: Scattered degenerative changes of the spine and pelvis. Trace anterolisthesis of L4 on L5. Multilevel disc height loss with osteophytes. Disc bulging. Streak artifact related to patient's right hip arthroplasty. IMPRESSION: Interval development of wall thickening with stranding along the ascending and transverse colon consistent with a colitis. There also involvement of the  terminal ileum. Small amount of dependent free fluid in the pelvis. No bowel obstruction. Stable surgical changes of gastric bypass. Scattered colonic diverticula. Stable right hepatic lobe liver masses consistent with known metastatic lesions. Separate hepatic cystic lesions. Compared to the recent chest CT slight increase in bilateral pleural effusions. The air component on the left is decreased. The left appears more complex with pleural thickening. Please correlate with the history and recommend follow-up Electronically  Signed   By: Karen Kays M.D.   On: 01/11/2023 17:32   DG Chest Port 1 View  Result Date: 01/11/2023 CLINICAL DATA:  Recent effusions. Nonproductive cough. Shortness of breath. EXAM: PORTABLE CHEST 1 VIEW COMPARISON:  Chest radiograph dated December 31, 2022. CT chest dated January 01, 2023. FINDINGS: The heart size and mediastinal contours are unchanged. Aortic atherosclerosis. Right chest port catheter tip at the level of the distal SVC. Similar size of a loculated left pleural effusion, better evaluated on the prior CT chest dated January 01, 2023, with basilar atelectasis. Persistent post treatment consolidative changes and architectural distortion in the perihilar left lung. Persistent small right pleural effusion. No pneumothorax. The visualized osseous structures are unchanged. IMPRESSION: 1. Similar loculated left pleural effusion and small right pleural effusion, better evaluated on the prior CT chest dated January 01, 2023. 2. Redemonstration of posttreatment parenchymal consolidation and architectural distortion in the left perihilar lung. Electronically Signed   By: Hart Robinsons M.D.   On: 01/11/2023 16:32    Pending Labs Unresulted Labs (From admission, onward)     Start     Ordered   01/11/23 1845  Gastrointestinal Panel by PCR , Stool  (Gastrointestinal Panel by PCR, Stool                                                                                                                                                      **Does Not include CLOSTRIDIUM DIFFICILE testing. **If CDIFF testing is needed, place order from the "C Difficile Testing" order set.**)  Once,   R        01/11/23 1844   01/11/23 1759  C Difficile Quick Screen w PCR reflex  (C Difficile quick screen w PCR reflex panel )  Once, for 24 hours,   URGENT       References:    CDiff Information Tool   01/11/23 1758   01/11/23 1241  C Difficile Quick Screen w PCR reflex  (C Difficile quick screen w PCR reflex panel )  Once, for 24 hours,   URGENT       References:    CDiff Information Tool   01/11/23 1240   Signed and Held  Basic metabolic panel  Tomorrow morning,   R        Signed and Held   Signed and Held  CBC  Tomorrow morning,   R        Signed and Held            Vitals/Pain Today's Vitals   01/11/23 1730 01/11/23 1829 01/11/23 1830 01/11/23 1915  BP: 99/71  111/74 (!) 96/53  Pulse: (!) 123  95 89  Resp: 19  (!) 27 (!) 24  Temp:  98.1 F (36.7 C)  98.1 F (36.7 C)  TempSrc:  Oral  SpO2: 98%  92% 97%  Weight:      Height:      PainSc:        Isolation Precautions Enteric precautions (UV disinfection)  Medications Medications  cefTRIAXone (ROCEPHIN) 2 g in sodium chloride 0.9 % 100 mL IVPB (0 g Intravenous Stopped 01/11/23 1859)    And  metroNIDAZOLE (FLAGYL) IVPB 500 mg (500 mg Intravenous New Bag/Given 01/11/23 1850)  cefTRIAXone (ROCEPHIN) 2 g in sodium chloride 0.9 % 100 mL IVPB (has no administration in time range)  metroNIDAZOLE (FLAGYL) IVPB 500 mg (has no administration in time range)  morphine (PF) 4 MG/ML injection 4 mg (4 mg Intravenous Given 01/11/23 1308)  ondansetron (ZOFRAN) injection 4 mg (4 mg Intravenous Given 01/11/23 1308)  iohexol (OMNIPAQUE) 300 MG/ML solution 100 mL (100 mLs Intravenous Contrast Given 01/11/23 1600)  sodium chloride 0.9 % bolus 1,000 mL (1,000 mLs Intravenous New Bag/Given 01/11/23 1801)    Mobility walks with person assist      Focused Assessments Pulmonary Assessment Handoff:  Lung sounds:   O2 Device: Nasal Cannula O2 Flow Rate (L/min): 3 L/min    R Recommendations: See Admitting Provider Note  Report given to:   Additional Notes:  Pt alert and oriented x 4, gets a little short of breath when she gets up to the bathroom. Is on 2 ltrs of oxygen

## 2023-01-11 NOTE — ED Provider Notes (Signed)
[facility] Provider Note   CSN: [id number] Arrival date & time: 01/11/23  1220     History  Chief Complaint  Patient presents with   Abdominal Pain    Delijah Cashman is a 72 y.o. female.  Pt with hx stage IV lung cancer, c/o abdominal pain and diarrhea. Diarrhea started a few days ago, 4-8 times per day, loose to watery. Nausea, and decreased po intake in past few days. In past 1-2 days, generalized abd pain, dull, non radiating, constant. No chest pain. Indicates chronically sob and on home o2 - no acute change. No new or worsening cough. No fever or chills. No back/flank pain. No dysuria or gu c/o. No fever or chills. Had been on antibiotic, amoxicillin, recently for uri symptoms. Denies known bad food ingestion or ill contacts.   The history is provided by the patient, medical records and the EMS personnel.  Abdominal Pain Associated symptoms: diarrhea and nausea   Associated symptoms: no chest pain, no chills, no cough, no dysuria, no fever, no sore throat and no vomiting        Home Medications Prior to Admission medications   Medication Sig Start Date End Date Taking? Authorizing Provider  acetaminophen (TYLENOL) 500 MG tablet Take 500 mg by mouth every 6 (six) hours as needed for moderate pain.    [provider]  albuterol (VENTOLIN HFA) 108 (90 Base) MCG/ACT inhaler Inhale 2 puffs into the lungs every 6 (six) hours as needed for wheezing or shortness of breath. 01/02/23   Ghimire, Werner Lean, MD  Calcium Carbonate Antacid (TUMS PO) Take 4 tablets by mouth daily.    [provider]  carvedilol (COREG) 3.125 MG tablet TAKE 1 TABLET BY MOUTH TWICE A DAY 09/29/22   Jonelle Sidle, MD  Cyanocobalamin (VITAMIN B-12) 2500 MCG SUBL Place 2,500 mcg under the tongue every morning.     [provider]  ELIQUIS 5 MG TABS tablet TAKE 1 TABLET BY MOUTH TWICE A DAY 12/25/22   Doreatha Massed, MD  ferrous  sulfate 325 (65 FE) MG tablet Take 325 mg by mouth every evening.    [provider]  folic acid (FOLVITE) 1 MG tablet TAKE 1 TABLET BY MOUTH EVERY DAY 11/22/22   Doreatha Massed, MD  furosemide (LASIX) 40 MG tablet Take 1 tablet (40 mg total) by mouth daily. 01/02/23   Ghimire, Werner Lean, MD  gabapentin (NEURONTIN) 600 MG tablet Take 1,200 mg by mouth at bedtime. 11/22/22   [provider]  Glycerin-Hypromellose-PEG 400 (DRY EYE RELIEF DROPS OP) Apply 1 drop to eye daily as needed (dry eyes).    [provider]  midodrine (PROAMATINE) 10 MG tablet Take 1 tablet (10 mg total) by mouth 3 (three) times daily with meals. 01/02/23   Ghimire, Werner Lean, MD  Multiple Vitamin (MULITIVITAMIN WITH MINERALS) TABS Take 1 tablet by mouth daily.    [provider]  omeprazole (PRILOSEC) 20 MG capsule Take 20 mg by mouth daily. 11/18/22   [provider]  potassium chloride SA (KLOR-CON M) 20 MEQ tablet Take 20 mEq by mouth daily.    [provider]      Allergies    Patient has no known allergies.    Review of Systems   Review of Systems  Constitutional:  Negative for chills and fever.  HENT:  Negative for sore throat.   Eyes:  Negative for redness.  Respiratory:  Negative for cough.  Cardiovascular:  Negative for chest pain and leg swelling.  Gastrointestinal:  Positive for abdominal pain, diarrhea and nausea. Negative for blood in stool and vomiting.  Genitourinary:  Negative for dysuria and flank pain.  Musculoskeletal:  Negative for back pain and neck pain.  Skin:  Negative for rash.  Neurological:  Negative for headaches.  Psychiatric/Behavioral:  Negative for confusion.     Physical Exam Updated Vital Signs BP 107/66   Pulse (!) 130   Temp 98.3 F (36.8 C) (Oral)   Resp 13   Ht 1.575 m (5\' 2" )   Wt 99.8 kg   SpO2 97%   BMI 40.24 kg/m  Physical Exam Vitals and nursing note reviewed.  Constitutional:      Appearance: Normal  appearance. She is well-developed.  HENT:     Head: Atraumatic.     Nose: Nose normal.     Mouth/Throat:     Mouth: Mucous membranes are moist.  Eyes:     General: No scleral icterus.    Conjunctiva/sclera: Conjunctivae normal.  Neck:     Trachea: No tracheal deviation.  Cardiovascular:     Rate and Rhythm: Tachycardia present.     Pulses: Normal pulses.     Heart sounds: Normal heart sounds. No murmur heard.    No friction rub. No gallop.  Pulmonary:     Effort: Pulmonary effort is normal. No respiratory distress.     Breath sounds: Normal breath sounds.  Abdominal:     General: Bowel sounds are normal. There is no distension.     Palpations: Abdomen is soft.     Tenderness: There is abdominal tenderness. There is no guarding.     Comments: Mid abd tenderness.   Genitourinary:    Comments: No cva tenderness.  Musculoskeletal:     Cervical back: Normal range of motion and neck supple. No rigidity. No muscular tenderness.     Comments: Chronic symmetric edema bil legs, pt indicates no acute worsening.   Skin:    General: Skin is warm and dry.     Findings: No rash.  Neurological:     Mental Status: She is alert.     Comments: Alert, speech normal.   Psychiatric:        Mood and Affect: Mood normal.     ED Results / Procedures / Treatments   Labs (all labs ordered are listed, but only abnormal results are displayed) Results for orders placed or performed during the hospital encounter of 01/11/23  CBC  Result Value Ref Range   WBC 7.4 4.0 - 10.5 K/uL   RBC 3.05 (L) 3.87 - 5.11 MIL/uL   Hemoglobin 8.8 (L) 12.0 - 15.0 g/dL   HCT 16.1 (L) 09.6 - 04.5 %   MCV 98.4 80.0 - 100.0 fL   MCH 28.9 26.0 - 34.0 pg   MCHC 29.3 (L) 30.0 - 36.0 g/dL   RDW 40.9 (H) 81.1 - 91.4 %   Platelets 477 (H) 150 - 400 K/uL   nRBC 0.3 (H) 0.0 - 0.2 %  Comprehensive metabolic panel  Result Value Ref Range   Sodium 141 135 - 145 mmol/L   Potassium 3.4 (L) 3.5 - 5.1 mmol/L   Chloride 104 98  - 111 mmol/L   CO2 25 22 - 32 mmol/L   Glucose, Bld 112 (H) 70 - 99 mg/dL   BUN 9 8 - 23 mg/dL   Creatinine, Ser 7.82 0.44 - 1.00 mg/dL   Calcium 8.4 (L) 8.9 - 10.3  mg/dL   Total Protein 6.2 (L) 6.5 - 8.1 g/dL   Albumin 2.2 (L) 3.5 - 5.0 g/dL   AST 19 15 - 41 U/L   ALT 12 0 - 44 U/L   Alkaline Phosphatase 94 38 - 126 U/L   Total Bilirubin 0.4 0.3 - 1.2 mg/dL   GFR, Estimated >78 >29 mL/min   Anion gap 12 5 - 15  Lipase, blood  Result Value Ref Range   Lipase 22 11 - 51 U/L   CT CHEST W CONTRAST  Result Date: 01/01/2023 CLINICAL DATA:  Pleural effusion, concern for empyema. Lung cancer. * Tracking Code: BO * EXAM: CT CHEST WITH CONTRAST TECHNIQUE: Multidetector CT imaging of the chest was performed during intravenous contrast administration. RADIATION DOSE REDUCTION: This exam was performed according to the departmental dose-optimization program which includes automated exposure control, adjustment of the mA and/or kV according to patient size and/or use of iterative reconstruction technique. CONTRAST:  50mL OMNIPAQUE IOHEXOL 350 MG/ML SOLN, 50mL OMNIPAQUE IOHEXOL 350 MG/ML SOLN COMPARISON:  12/24/2022. FINDINGS: Cardiovascular: Poor IV contrast bolus due to tubing malfunction. Right IJ power port terminates at the SVC RA junction. Atherosclerotic calcification of the aorta and aortic valve. Pulmonic trunk and heart are enlarged. There may be a small amount of pericardial fluid. Mediastinum/Nodes: No pathologically enlarged mediastinal or axillary lymph nodes. Hilar regions are difficult to definitively evaluate without IV contrast. Esophagus is grossly unremarkable. Lungs/Pleura: Post treatment parenchymal consolidation in the perihilar left hemithorax. Patchy pulmonary parenchymal ground-glass, septal thickening and minimal consolidation in the right lung. Small to moderate loculated left pleural effusion has decreased in size in the interval with locules of air, presumably related to interval  intervention. Small partially loculated right pleural effusion, similar. Airway is unremarkable. Upper Abdomen: Probable small right hepatic lobe cyst. Visualized portions of the liver, adrenal glands, kidneys, spleen and pancreas are grossly unremarkable. Gastric bypass. Cholecystectomy. No upper abdominal adenopathy. Musculoskeletal: Degenerative changes in the spine. Left shoulder arthroplasty. Right shoulder degenerative changes. No worrisome lytic or sclerotic lesions. IMPRESSION: 1. Small to moderate loculated left pleural effusion, decreased in size from 12/24/2022. Associated internal locules of air, presumably iatrogenic. Please correlate clinically. Findings suggest empyema. 2. Persistent patchy ground-glass, septal thickening and scattered consolidation in the right lung. Findings may be due to pulmonary edema or pneumonia. 3. Post treatment parenchymal consolidation and architectural distortion in the perihilar left hemithorax. 4. Small partially loculated right pleural effusion, similar. 5.  Aortic atherosclerosis (ICD10-I70.0). 6. Enlarged pulmonic trunk, indicative of pulmonary arterial hypertension. Electronically Signed   By: Leanna Battles M.D.   On: 01/01/2023 16:46   DG CHEST PORT 1 VIEW  Result Date: 12/31/2022 CLINICAL DATA:  Pleural effusion. EXAM: PORTABLE CHEST 1 VIEW COMPARISON:  12/30/2022. FINDINGS: There is significantly improved aeration at the left mid lung. Persistent alveolar consolidation left base and left upper lung. Diffuse pulmonary interstitial prominence consistent with edema. Enlarged cardiac silhouette. No pneumothorax. Right-sided Port-A-Cath tip mid SVC. Drainage catheter in the soft tissues of the left lateral chest wall has been removed. IMPRESSION: Improved aeration left mid lung. Pulmonary vascular congestion. Enlarged cardiac silhouette. Electronically Signed   By: Layla Maw M.D.   On: 12/31/2022 10:39   DG CHEST PORT 1 VIEW  Result Date:  12/30/2022 CLINICAL DATA:  Chest tube in place. EXAM: PORTABLE CHEST 1 VIEW COMPARISON:  One-view chest x-ray 12/28/2022. FINDINGS: A right IJ Port-A-Cath is in place. The heart is enlarged. Atherosclerotic calcifications are present at the aortic arch.  Left greater than right pleural effusions are present. A small bore catheter is outside the chest cavity. No pneumothorax is present. IMPRESSION: 1. Left greater than right pleural effusions. 2. Cardiomegaly without failure. 3. Small bore catheter is outside the chest cavity. 4. Right IJ Port-A-Cath. Critical Value/emergent results were called by telephone at the time of interpretation on 12/30/2022 at 2:43 pm to provider Dr. Irma Newness , who verbally acknowledged these results. Electronically Signed   By: Marin Roberts M.D.   On: 12/30/2022 14:44   DG CHEST PORT 1 VIEW  Result Date: 12/29/2022 CLINICAL DATA:  Status post left chest tube placement for pleural effusion. EXAM: PORTABLE CHEST 1 VIEW COMPARISON:  12/28/2022 FINDINGS: Stable cardiac enlargement. Stable appearance of Port-A-Cath. Interval placement of pigtail chest tube in the inferolateral left pleural space. Tube projects just into the pleural space. Diminished pleural fluid. No definite pneumothorax. Bilateral lower lobe atelectasis. IMPRESSION: Interval placement of pigtail chest tube in the inferolateral left pleural space with diminished pleural fluid. No definite pneumothorax. Distal portion of the chest tube just projects into the pleural space by x-ray. Electronically Signed   By: Irish Lack M.D.   On: 12/29/2022 16:38   DG CHEST PORT 1 VIEW  Result Date: 12/28/2022 CLINICAL DATA:  Shortness of breath EXAM: PORTABLE CHEST 1 VIEW COMPARISON:  12/26/2022 FINDINGS: Single frontal view of the chest demonstrates a stable cardiac silhouette. Stable chronic consolidation and volume loss within the left hemithorax consistent with post therapeutic changes in a patient with a known  history of lung cancer. Stable bilateral pleural effusions, left greater than right. No new consolidation. No pneumothorax. Stable right chest wall port. IMPRESSION: 1. Stable bilateral pleural effusions, left greater than right. 2. Stable chronic consolidation within the left lung consistent with post therapeutic changes in a patient with a history of lung cancer. Electronically Signed   By: Sharlet Salina M.D.   On: 12/28/2022 09:11   DG CHEST PORT 1 VIEW  Result Date: 12/26/2022 CLINICAL DATA:  Pleural effusion. EXAM: PORTABLE CHEST 1 VIEW COMPARISON:  Chest radiograph 12/25/2022 and CT 12/24/2022 FINDINGS: A right jugular Port-A-Cath remains in place, terminating over the mid SVC. The left heart border remains obscured. Aortic atherosclerosis is noted. There is a persistent moderate left pleural effusion with apparent mild differences compared to yesterday's radiograph potentially due to differences in patient positioning. Left suprahilar lung opacity has not significantly changed in the setting of previous radiation for lung cancer. Left lower lung airspace opacities are stable to mildly improved. Hazy and patchy airspace opacities throughout the right lung have not significantly changed, and there is likely a persistent small right pleural effusion. No pneumothorax is identified. IMPRESSION: 1. Persistent moderate left pleural effusion with stable to mildly improved left lung aeration. 2. Unchanged right lung infiltrates. Electronically Signed   By: Sebastian Ache M.D.   On: 12/26/2022 11:12   ECHOCARDIOGRAM LIMITED  Result Date: 12/25/2022    ECHOCARDIOGRAM LIMITED REPORT   Patient Name:   JAQUESHA BOTTGER Date of Exam: 12/25/2022 Medical Rec #:  [id number]   Height:       62.0 in Accession #:    [id number]  Weight:       220.0 lb Date of Birth:  [birth year]    BSA:          1.991 m Patient Age:    72 years    BP:           106/62 mmHg Patient Gender: F  HR:           112 bpm. [facility]am [facility]  Procedure: Limited Echo Indications:    Atrial Fibrillation I48.91  History:        Patient has prior history of Echocardiogram examinations, most                 recent 07/19/2022. Risk Factors:Hypertension, Diabetes and Former                 Smoker.  Sonographer:    Celesta Gentile RCS Referring Phys: [phone] Heloise Beecham EMOKPAE IMPRESSIONS  1. Limited study.  2. Left ventricular ejection fraction, by estimation, is 65 to 70%. The left ventricle has normal function. The left ventricle has no regional wall motion abnormalities. There is moderate concentric left ventricular hypertrophy. There is the interventricular septum is flattened in systole and diastole, consistent with right ventricular pressure and volume overload.  3. Right ventricular systolic function is normal. The right ventricular size is mildly enlarged.  4. A small pericardial effusion is present. The pericardial effusion is circumferential.  5. The mitral valve is degenerative. Moderate mitral annular calcification.  6. The aortic valve is tricuspid. There is moderate calcification of the aortic valve. Aortic valve appears significantly stenotic - suggest complete interrogation for further assessment.  7. The inferior vena cava is normal in size with greater than 50% respiratory variability, suggesting right atrial pressure of 3 mmHg. Comparison(s): Prior images reviewed side by side. Difficult to compare given limited study - suggest complete study for further assessment. FINDINGS  Left Ventricle: Left ventricular ejection fraction, by estimation, is 65 to 70%. The left ventricle has normal function. The left ventricle has no regional wall motion abnormalities. The left ventricular internal cavity size was normal in size. There is  moderate concentric left ventricular hypertrophy. The interventricular septum is flattened in systole and diastole, consistent with right ventricular pressure and volume overload. Right Ventricle: The right ventricular size  is mildly enlarged. No increase in right ventricular wall thickness. Right ventricular systolic function is normal. Pericardium: A small pericardial effusion is present. The pericardial effusion is circumferential. Mitral Valve: The mitral valve is degenerative in appearance. There is mild thickening of the mitral valve leaflet(s). There is mild calcification of the mitral valve leaflet(s). Moderate mitral annular calcification. Aortic Valve: The aortic valve is tricuspid. There is moderate calcification of the aortic valve. Aorta: The aortic root is normal in size and structure. Venous: The inferior vena cava is normal in size with greater than 50% respiratory variability, suggesting right atrial pressure of 3 mmHg. LEFT VENTRICLE PLAX 2D LVIDd:         3.40 cm LVIDs:         2.40 cm LV PW:         1.40 cm LV IVS:        1.40 cm LVOT diam:     1.70 cm LVOT Area:     2.27 cm  RIGHT VENTRICLE TAPSE (M-mode): 1.8 cm LEFT ATRIUM         Index LA diam:    3.20 cm 1.61 cm/m   AORTA Ao Root diam: 3.00 cm  SHUNTS Systemic Diam: 1.70 cm Nona Dell MD Electronically signed by Nona Dell MD Signature Date/Time: 12/25/2022/4:01:55 PM    Final    DG Chest 1 View  Result Date: 12/25/2022 CLINICAL DATA:  Status post thoracentesis. Non-small cell lung cancer. EXAM: CHEST  1 VIEW COMPARISON:  12/24/2022 FINDINGS: Reduced size of  the left pleural effusion. No pneumothorax. Right power injectable Port-A-Cath tip: SVC. Atherosclerotic calcification of the aortic arch. Continued indistinctness of the left heart border. Continued left perihilar and lower lobe airspace opacity Mild blunting of the right costophrenic angle compatible with small right pleural effusion. Hazy density in the right upper lobe and right lower lobe compatible with the ground-glass opacities and mild infrahilar airspace opacity shown on yesterday's chest CT. IMPRESSION: 1. Reduced size of the left pleural effusion, without pneumothorax. 2.  Continued left perihilar and lower lobe airspace opacity. 3. Small right pleural effusion. 4. Hazy density in the right upper lobe and right lower lobe compatible with the ground-glass opacities and mild infrahilar airspace opacity shown on yesterday's chest CT. Electronically Signed   By: Gaylyn Rong M.D.   On: 12/25/2022 13:06   US THORACENTESIS ASP PLEURAL SPACE W/IMG GUIDE  Result Date: 12/25/2022 INDICATION: Left pleural effusion. EXAM: ULTRASOUND GUIDED left THORACENTESIS MEDICATIONS: None. COMPLICATIONS: None immediate. PROCEDURE: An ultrasound guided thoracentesis was thoroughly discussed with the patient and questions answered. The benefits, risks, alternatives and complications were also discussed. The patient understands and wishes to proceed with the procedure. Written consent was obtained. Ultrasound was performed to localize and mark an adequate pocket of fluid in the left chest. The area was then prepped and draped in the normal sterile fashion. 1% Lidocaine was used for local anesthesia. Under ultrasound guidance a Safe-T-Centesis catheter was introduced. Thoracentesis was performed. The catheter was removed and a dressing applied. FINDINGS: A total of approximately 850 mL of bloody fluid was removed. Samples were sent to the laboratory as requested by the clinical team. IMPRESSION: Successful ultrasound guided left thoracentesis yielding 850 mL of pleural fluid. Electronically Signed   By: Lupita Raider M.D.   On: 12/25/2022 10:52   CT Angio Chest PE W and/or Wo Contrast  Result Date: 12/24/2022 CLINICAL DATA:  High probability pulmonary embolism, progressive dyspnea, hypoxia. EXAM: CT ANGIOGRAPHY CHEST WITH CONTRAST TECHNIQUE: Multidetector CT imaging of the chest was performed using the standard protocol during bolus administration of intravenous contrast. Multiplanar CT image reconstructions and MIPs were obtained to evaluate the vascular anatomy. RADIATION DOSE REDUCTION: This  exam was performed according to the departmental dose-optimization program which includes automated exposure control, adjustment of the mA and/or kV according to patient size and/or use of iterative reconstruction technique. CONTRAST:  75mL OMNIPAQUE IOHEXOL 350 MG/ML SOLN COMPARISON:  None Available. FINDINGS: Cardiovascular: Cardiac size is at the upper limits of normal with enlargement of the right ventricle again noted. Extensive calcification of the aortic valve leaflets. No significant coronary artery calcification. Small pericardial effusion is stable. There is adequate opacification of the pulmonary arterial tree. No intraluminal filling defect identified to suggest acute pulmonary embolism through the segmental level. The central pulmonary arteries are mildly enlarged in keeping with changes of pulmonary arterial hypertension, stable since prior examination. Mild atherosclerotic calcifications seen within the thoracic aorta. No aortic aneurysm. Right internal jugular chest port tip noted within the superior vena cava. Mediastinum/Nodes: Visualized thyroid is unremarkable. No pathologic thoracic adenopathy. Esophagus is unremarkable. Lungs/Pleura: Cicatricial changes and chronic consolidation again noted involving the apicoposterior segment of the left upper lobe and left perihilar region in keeping with post radiation change. Large left pleural effusion is seen, enlarged since prior examination, with increasing compressive atelectasis of the left lung. There is interval development of a small left pleural effusion. Extensive asymmetric ground-glass and consolidative infiltrates are noted throughout the right lung, more prevalent within  the right upper and right lower lobes likely reflecting changes of acute, multifocal infection in the appropriate clinical setting. No pneumothorax. No central obstructing lesion. Upper Abdomen: Enhancing lesions within the a liver bilaterally, best appreciated within the  right hepatic lobe, are again seen, not well delineated given early phase of contrast enhancement. Dominant lesion measures 3.7 x 3.8 cm, approximately stable since prior examination. No acute abnormality. Musculoskeletal: No acute bone abnormality. No lytic or blastic bone lesion. Review of the MIP images confirms the above findings. IMPRESSION: 1. No pulmonary embolism. 2. Extensive asymmetric ground-glass and consolidative infiltrates throughout the right lung, more prevalent within the right upper and right lower lobes likely reflecting changes of acute, multifocal infection in the appropriate clinical setting. Interval development of small possibly parapneumonic right pleural effusion. 3. Large left pleural effusion, enlarged since prior examination, with increasing compressive atelectasis of the left lung. 4. Stable post radiation changes within the left upper lobe and left perihilar region. 5. Stable enhancing lesions within the liver bilaterally, not well delineated given early phase of contrast enhancement. 6. Stable enlargement of the right ventricle and central pulmonary arteries in keeping with changes of pulmonary arterial hypertension and elevated right heart pressure. Aortic Atherosclerosis (ICD10-I70.0). Electronically Signed   By: Helyn Numbers M.D.   On: 12/24/2022 21:02   DG Chest Portable 1 View  Result Date: 12/24/2022 CLINICAL DATA:  Increasing shortness of breath x1 week. Labored respirations. Hypoxia. Currently on chemotherapy for left non-small-cell lung cancer. EXAM: PORTABLE CHEST 1 VIEW COMPARISON:  Chest radiographs 07/17/2021, 11/19/2018, 04/11/2011; CT chest 10/09/2022 FINDINGS: Right chest wall porta catheter with tip overlying the mid superior vena cava, similar to prior. Cardiac silhouette is again mildly enlarged. Mediastinal contours are within normal limits. There is a moderate left pleural effusion, likely similar to 10/09/2022 CT but increased from 07/17/2021 radiograph.  Similar superolateral left pleural thickening with associated left suprahilar density extending to the superolateral pleural surface likely corresponding to the left suprahilar postradiation changes and pleural thickening/pleural fluid seen on prior CT. Trace blunting the right costophrenic angle. No pneumothorax is seen. Minimal dextrocurvature of the midthoracic spine with mild multilevel degenerative disc changes. Partial visualization of left shoulder arthroplasty. Severe right glenohumeral osteoarthritis. IMPRESSION: 1. Posttreatment changes of radiation therapy within the left suprahilar region. Mild left pleural effusion tracking up the lateral left pleura and into the superolateral left lung radiation field appears similar to 10/09/2022 prior CT. 2. Tiny right pleural effusion. Electronically Signed   By: Neita Garnet M.D.   On: 12/24/2022 19:15     EKG EKG Interpretation Date/Time:  Thursday January 11 2023 12:31:49 EDT Ventricular Rate:  124 PR Interval:  134 QRS Duration:  133 QT Interval:  323 QTC Calculation: 468 R Axis:   -88  Text Interpretation: Sinus tachycardia RBBB and LAFB Non-specific ST-t changes Baseline wander Confirmed by Cathren Laine ([id number]) on 01/11/2023 12:41:44 PM  Radiology No results found.  Procedures Procedures    Medications Ordered in ED Medications  morphine (PF) 4 MG/ML injection 4 mg (4 mg Intravenous Given 01/11/23 1308)  ondansetron (ZOFRAN) injection 4 mg (4 mg Intravenous Given 01/11/23 1308)    ED Course/ Medical Decision Making/ A&P                                 Medical Decision Making Problems Addressed: Chronic anemia: chronic illness or injury with exacerbation, progression, or side effects of treatment  that poses a threat to life or bodily functions Diarrhea, unspecified type: acute illness or injury with systemic symptoms that poses a threat to life or bodily functions Generalized abdominal pain: acute illness or injury with  systemic symptoms that poses a threat to life or bodily functions Nausea: acute illness or injury with systemic symptoms Stage 4 lung cancer, unspecified laterality (HCC): chronic illness or injury with exacerbation, progression, or side effects of treatment that poses a threat to life or bodily functions  Amount and/or Complexity of Data Reviewed Independent Historian: EMS    Details: hx External Data Reviewed: notes. Labs: ordered. Decision-making details documented in ED Course. Radiology: ordered and independent interpretation performed. Decision-making details documented in ED Course. ECG/medicine tests: ordered and independent interpretation performed. Decision-making details documented in ED Course.  Risk Prescription drug management. Parenteral controlled substances. Decision regarding hospitalization.   Iv ns. Continuous pulse ox and cardiac monitoring. Labs ordered/sent. Imaging ordered.   Differential diagnosis includes colitis, c diff, diverticulitis, etc. Dispo decision including potential need for admission considered - will get labs and imaging and reassess.   Reviewed nursing notes and prior charts for additional history. External reports reviewed. Additional history from: EMS.   Morphine iv. Zofran iv. Ivf.   Cardiac monitor: sinus rhythm, rate 104.  Labs reviewed/interpreted by me - wbc normal. Hgb 8 c/w prior. Cr normal.   Xrays reviewed/interpreted by me - pnd,  CT reviewed/interpreted by me - pnd.   1434, some labs and imaging studies remains pending - signed out to  Dr Charm Barges to check pending studies, recheck pt, and dispo appropriately.           Final Clinical Impression(s) / ED Diagnoses Final diagnoses:  None    Rx / DC Orders ED Discharge Orders     None         Cathren Laine, MD 01/11/23 1436

## 2023-01-11 NOTE — ED Provider Notes (Signed)
Signout from Dr. Denton Lank.  72 year old female with lung cancer here with abdominal pain and nausea diarrhea.  Lab work fairly unremarkable.  Chronically low hemoglobin normal white count.  She is pending C. difficile test urinalysis and a CT abdomen and pelvis.  Disposition per results of testing although due to her frailty would likely need admission to the hospital. Physical Exam  BP (!) 104/58   Pulse (!) 122   Temp 98.3 F (36.8 C) (Oral)   Resp 16   Ht 5\' 2"  (1.575 m)   Wt 99.8 kg   SpO2 93%   BMI 40.24 kg/m   Physical Exam  Procedures  Procedures  ED Course / MDM    Medical Decision Making Amount and/or Complexity of Data Reviewed Labs: ordered. Radiology: ordered.  Risk Prescription drug management. Decision regarding hospitalization.   1741 - CT coming back with diffuse colitis.  She said she is feeling much better as far as pain although remains tachycardic and blood pressure soft.  I agree she probably would benefit from mission to the hospital and we will start her on some fluids and antibiotics.  Hospitalist has been paged.  1802-discussed with [facility] hospitalist Dr. Randol Kern who will evaluate patient for admission.       Terrilee Files, MD 01/12/23 [phone]

## 2023-01-12 ENCOUNTER — Encounter (HOSPITAL_COMMUNITY): Payer: Self-pay | Admitting: Internal Medicine

## 2023-01-12 DIAGNOSIS — E1159 Type 2 diabetes mellitus with other circulatory complications: Secondary | ICD-10-CM

## 2023-01-12 DIAGNOSIS — I10 Essential (primary) hypertension: Secondary | ICD-10-CM | POA: Diagnosis not present

## 2023-01-12 DIAGNOSIS — I4891 Unspecified atrial fibrillation: Secondary | ICD-10-CM | POA: Diagnosis not present

## 2023-01-12 DIAGNOSIS — C3492 Malignant neoplasm of unspecified part of left bronchus or lung: Secondary | ICD-10-CM

## 2023-01-12 DIAGNOSIS — K529 Noninfective gastroenteritis and colitis, unspecified: Secondary | ICD-10-CM | POA: Diagnosis not present

## 2023-01-12 LAB — CBC
HCT: 30.5 % — ABNORMAL LOW (ref 36.0–46.0)
Hemoglobin: 8.8 g/dL — ABNORMAL LOW (ref 12.0–15.0)
MCH: 28.6 pg (ref 26.0–34.0)
MCHC: 28.9 g/dL — ABNORMAL LOW (ref 30.0–36.0)
MCV: 99 fL (ref 80.0–100.0)
Platelets: 461 10*3/uL — ABNORMAL HIGH (ref 150–400)
RBC: 3.08 MIL/uL — ABNORMAL LOW (ref 3.87–5.11)
RDW: 16 % — ABNORMAL HIGH (ref 11.5–15.5)
WBC: 6.6 10*3/uL (ref 4.0–10.5)
nRBC: 0.5 % — ABNORMAL HIGH (ref 0.0–0.2)

## 2023-01-12 LAB — BASIC METABOLIC PANEL
Anion gap: 10 (ref 5–15)
BUN: 9 mg/dL (ref 8–23)
CO2: 24 mmol/L (ref 22–32)
Calcium: 8 mg/dL — ABNORMAL LOW (ref 8.9–10.3)
Chloride: 104 mmol/L (ref 98–111)
Creatinine, Ser: 0.97 mg/dL (ref 0.44–1.00)
GFR, Estimated: >60 mL/min (ref >60)
Glucose, Bld: 129 mg/dL — ABNORMAL HIGH (ref 70–99)
Potassium: 3.4 mmol/L — ABNORMAL LOW (ref 3.5–5.1)
Sodium: 138 mmol/L (ref 135–145)

## 2023-01-12 MED ORDER — GABAPENTIN 400 MG PO CAPS
400.0000 mg | ORAL_CAPSULE | Freq: Every day | ORAL | Status: DC
  Administered 2023-01-12: 400 mg via ORAL

## 2023-01-12 MED ORDER — SODIUM CHLORIDE 0.9 % IV SOLN: INTRAVENOUS | Status: DC | PRN

## 2023-01-12 MED ORDER — DIPHENHYDRAMINE-ZINC ACETATE 2-0.1 % EX CREA
TOPICAL_CREAM | Freq: Three times a day (TID) | CUTANEOUS | Status: DC | PRN
  Administered 2023-01-12: 1 via TOPICAL
  Filled 2023-01-12: qty 28

## 2023-01-12 MED ORDER — ADULT MULTIVITAMIN W/MINERALS CH
1.0000 | ORAL_TABLET | Freq: Every day | ORAL | Status: DC
  Administered 2023-01-12 – 2023-01-15 (×4): 1 via ORAL
  Filled 2023-01-12 (×4): qty 1

## 2023-01-12 MED ORDER — POLYVINYL ALCOHOL 1.4 % OP SOLN: 1.0000 [drp] | Freq: Every day | OPHTHALMIC | Status: DC | PRN

## 2023-01-12 MED ORDER — DIPHENHYDRAMINE HCL 25 MG PO CAPS: 50.0000 mg | ORAL_CAPSULE | Freq: Four times a day (QID) | ORAL | Status: DC | PRN

## 2023-01-12 MED ORDER — METOPROLOL TARTRATE 5 MG/5ML IV SOLN
2.5000 mg | Freq: Once | INTRAVENOUS | Status: AC
  Administered 2023-01-12: 2.5 mg via INTRAVENOUS
  Filled 2023-01-12: qty 5

## 2023-01-12 MED ORDER — GABAPENTIN 300 MG PO CAPS
600.0000 mg | ORAL_CAPSULE | Freq: Every day | ORAL | Status: DC
  Administered 2023-01-12 – 2023-01-14 (×3): 600 mg via ORAL
  Filled 2023-01-12 (×3): qty 2

## 2023-01-12 MED ORDER — CARVEDILOL 3.125 MG PO TABS
6.2500 mg | ORAL_TABLET | Freq: Two times a day (BID) | ORAL | Status: DC
  Administered 2023-01-12 – 2023-01-15 (×5): 6.25 mg via ORAL
  Filled 2023-01-12 (×6): qty 2

## 2023-01-12 MED ORDER — POTASSIUM CHLORIDE CRYS ER 20 MEQ PO TBCR
40.0000 meq | EXTENDED_RELEASE_TABLET | Freq: Every day | ORAL | Status: DC
Start: 2023-01-12 — End: 2023-01-15
  Administered 2023-01-12 – 2023-01-15 (×4): 40 meq via ORAL
  Filled 2023-01-12 (×3): qty 2

## 2023-01-12 MED ORDER — GABAPENTIN 300 MG PO CAPS
600.0000 mg | ORAL_CAPSULE | Freq: Once | ORAL | Status: AC
  Administered 2023-01-12: 600 mg via ORAL
  Filled 2023-01-12: qty 2

## 2023-01-12 MED ORDER — CHLORHEXIDINE GLUCONATE CLOTH 2 % EX PADS
6.0000 | MEDICATED_PAD | Freq: Every day | CUTANEOUS | Status: DC
  Administered 2023-01-12 – 2023-01-15 (×4): 6 via TOPICAL

## 2023-01-12 MED ORDER — SODIUM CHLORIDE 0.9 % IV SOLN: INTRAVENOUS | Status: AC

## 2023-01-12 NOTE — Progress Notes (Signed)
   01/12/23 0726  Vitals  BP 101/68  MAP (mmHg) 80  BP Location Left Arm  BP Method Automatic  Patient Position (if appropriate) Sitting  Pulse Rate (!) 139  Pulse Rate Source Dinamap  ECG Heart Rate (!) 139  Resp (!) 24  Level of Consciousness  Level of Consciousness Alert  MEWS COLOR  MEWS Score Color Red  Oxygen Therapy  SpO2 98 %  O2 Device Nasal Cannula  O2 Flow Rate (L/min) 3 L/min  Pain Assessment  Pain Scale 0-10  Pain Score 10  Pain Type Chronic pain  Pain Location Knee  Pain Orientation Right  Pain Descriptors / Indicators Aching;Throbbing  MEWS Score  MEWS Temp 0  MEWS Systolic 0  MEWS Pulse 3  MEWS RR 1  MEWS LOC 0  MEWS Score 4   Pt's HR noted to be 140's on tele monitor. In to room to assess pt, pt sitting up in chair, rocking and moaning. Pt states, "My legs are killing me. Lynnell Catalan, I need something for pain please! I didn't get my gabapentin like I'm supposed to and now I am hurting so bad!" Vital signs as above. Skin warm and dry, color appropriate for ethnicity. Pt noted to have purplish-colored discoloration scattered on her arms and hands. Pt states has been there for 2 days, no itching or pain associated with it. Lungs clear but diminished bases, O2 on at 3 lpm [place]. HR RRR, tachycardic. Pt given pain medication as ordered. Primary nurse updated on assessment and interventions at this time.

## 2023-01-12 NOTE — Plan of Care (Signed)

## 2023-01-12 NOTE — TOC Initial Note (Signed)
Transition of Care [facility]) - Initial/Assessment Note    Patient Details  Name: Denise [name] MRN: [id number] Date of Birth: 1950-12-21  Transition of Care [facility]) CM/SW Contact:    Leitha Bleak, RN Phone Number: 01/12/2023, 3:53 PM  Clinical Narrative:       Patient admitted with Colitis. Patient has a high risk for readmission. Patients with her niece. PT has no follow up needed. Patient recently got a hospital bed for the home. No other needs at this time. Cardiology following. TOC following.             Expected Discharge Plan: Home/Self Care Barriers to Discharge: Continued Medical Work up   Patient Goals and CMS Choice Patient states their goals for this hospitalization and ongoing recovery are:: to go home CMS Medicare.gov Compare Post Acute Care list provided to:: Patient Choice offered to / list presented to : Patient   Expected Discharge Plan and Services      Living arrangements for the past 2 months: Single Family Home       Prior Living Arrangements/Services Living arrangements for the past 2 months: Single Family Home Lives with:: Relatives           Current home services: DME   Activities of Daily Living   ADL Screening (condition at time of admission) Independently performs ADLs?: Yes (appropriate for developmental age) Does the patient have a NEW difficulty with bathing/dressing/toileting/self-feeding that is expected to last >3 days?: No Does the patient have a NEW difficulty with getting in/out of bed, walking, or climbing stairs that is expected to last >3 days?: No Does the patient have a NEW difficulty with communication that is expected to last >3 days?: No Is the patient deaf or have difficulty hearing?: No Does the patient have difficulty seeing, even when wearing glasses/contacts?: No Does the patient have difficulty concentrating, remembering, or making decisions?: No  Permission Sought/Granted       Affect (typically observed):  Accepting Orientation: : Oriented to Self, Oriented to Place, Oriented to  Time, Oriented to Situation Alcohol / Substance Use: Not Applicable Psych Involvement: No (comment)  Admission diagnosis:  Colitis [K52.9] Nausea [R11.0] Generalized abdominal pain [R10.84] Chronic anemia [D64.9] Stage 4 lung cancer, unspecified laterality (HCC) [C34.90] Diarrhea, unspecified type [R19.7] Patient Active Problem List   Diagnosis Date Noted   Colitis 01/11/2023   Pneumonia of left lower lobe due to infectious organism 12/28/2022   Acute respiratory failure with hypoxia (HCC) 12/28/2022   Loculated pleural effusion 12/28/2022   Acute on chronic respiratory failure with hypoxia (HCC) 12/24/2022   Pleural effusion, left 12/24/2022   Multifocal pneumonia 12/24/2022   Atrial fibrillation with RVR (HCC) 12/24/2022   Severe sepsis (HCC) 12/24/2022   Iron deficiency anemia 09/02/2022   Early satiety 09/02/2022   Intramuscular hematoma 08/03/2021   Pulmonary embolus (HCC) 07/17/2021   Type 2 diabetes mellitus (HCC)    Essential hypertension    History of lung cancer    Class 3 obesity    Hypokalemia    Malignant neoplasm metastatic to brain (HCC) 06/04/2020   Meningioma, cerebral (HCC) 05/18/2020   Abnormal myocardial perfusion study    Aortic stenosis    DOE (dyspnea on exertion) 10/09/2019   Meningioma (HCC) 03/20/2019   Port-A-Cath in place 01/06/2019   Adenocarcinoma of left lung (HCC) 12/09/2018   S/P shoulder replacement 01/21/2015   Expected blood loss anemia 03/08/2012   Morbid obesity with BMI of 45.0-49.9, adult (HCC) 03/08/2012   S/P right THA, AA  03/07/2012   Small bowel obstruction (HCC) 04/11/2011   Incisional hernia with obstruction 04/11/2011   Pelvic mass 03/29/2011   Radicular low back pain 11/03/2010   Hip arthritis 11/03/2010   PCP:  Mirna Mires, MD Pharmacy:   [facility] [phone] - [place], [place] - [address] AT [facility] [address] [place]  [address] Phone: [phone] Fax: [phone]  [facility] [address] [place] [address] Phone: [phone] Fax: [phone]     Social Determinants of Health (SDOH) Social History: SDOH Screenings   Food Insecurity: No Food Insecurity (01/11/2023)  Housing: Patient Declined (01/11/2023)  Transportation Needs: No Transportation Needs (01/11/2023)  Utilities: Patient Declined (01/11/2023)  Financial Resource Strain: Low Risk  (12/06/2018)  Physical Activity: Inactive (12/06/2018)  Social Connections: Moderately Isolated (12/06/2018)  Stress: No Stress Concern Present (12/09/2018)   [facility] from [facility]  Tobacco Use: Medium Risk (01/11/2023)  Health Literacy: Low Risk  (01/01/2022)   Received from [facility]   SDOH Interventions:     Readmission Risk Interventions    01/12/2023    3:51 PM 08/09/2021    1:08 PM  Readmission Risk Prevention Plan  Transportation Screening Complete Complete  PCP or Specialist Appt within 3-5 Days Not Complete Complete  HRI or Home Care Consult Complete Complete  Social Work Consult for Recovery Care Planning/Counseling Complete Complete  Palliative Care Screening Not Applicable Not Applicable  Medication Review Oceanographer) Complete Complete

## 2023-01-12 NOTE — Progress Notes (Signed)
PROGRESS NOTE   Denise [name]  WUJ:[id number] DOB: 17-Nov-1950 DOA: 01/11/2023 PCP: Mirna Mires, MD   Chief Complaint  Patient presents with   Abdominal Pain   Level of care: Telemetry  Brief Admission History:  72 y.o. female, with history of metastatic/stage IV adenocarcinoma of the lung, chronic toxic respiratory failure on 2 L of oxygen, PE, DM-2, HTN, aortic stenosis with recent hospitalization at [facility] for loculated pleural effusion, required chest tube insertion, did not receive any lytics as chest tube was dislodged prior and repeat CT chest showing stable effusion, and recommendation has been made for prolonged antibiotic course. -Patient presents to ED secondary to complaints of abdominal pain, started few days ago, as well she did report loose to watery BMs, 4-6 times per day, she does report some nausea, but denies any vomiting, she reports decreased oral intake, denies any melena, hematochezia, orts dyspnea at baseline, on home oxygen, she denies any chest pain, no cough, no fever, no chills. -ED CT abdomen pelvis showing stable liver metastasis, but showing new ascending and transverse colitis, labs were significant for white blood cell of 7.4, hemoglobin stable at 8.8, creatinine stable at 0.96, potassium low at 3.4, patient received IV Rocephin and Flagyl and [facility] hospitalist consulted to admit.   Assessment and Plan:  Generalized Abd pain Ascending/transverse colon colitis -Presented with abdominal pain, nausea, imaging significant for ascending and transverse colon colitis -Continue with IV Rocephin, and Flagyl -follow up C. difficile, GI panel   Paroxysmal A-fib -continue with Coreg for heart rate control, continue with Eliquis for anticoagulation   Hypertension -Continue with Coreg, mainly for heart rate control -Continue with midodrine   Chronic diastolic CHF -she appears to be a euvolemic, continue with home dose Lasix for now   Chronic respiratory  failure with hypoxia Left loculated pleural effusion -He is at baseline on 2 L nasal cannula -Chest x-ray showing stable left little pleural effusion   Aortic stenosis chronic issue.   Normocytic anemia Likely due to underlying malignancy No evidence of blood loss Hemoglobin at baseline   Metastatic adenocarcinoma of the lung Follow-up with oncology in the outpatient setting   History of unprovoked pulmonary embolism Continue with anticoagulation   Obesity class II Body mass index is 39.03 kg/m.   DVT Prophylaxis Eliquis  DVT prophylaxis: apixaban Code Status: Full  Family Communication:  Disposition: Status is: Inpatient   Consultants:   Procedures:   Antimicrobials:  CTX 10/24>> Metronidazole 10/24>>  Subjective: Pt complains of leg pain, palpitations, abdominal pain.  C/o itching rash Bilateral arms.   Objective: Vitals:   01/12/23 0852 01/12/23 1005 01/12/23 1104 01/12/23 1251  BP: (!) 121/96 (!) 116/93 106/70 99/61  Pulse: (!) 138 (!) 135  (!) 116  Resp: 19  20 20   Temp: 98 F (36.7 C) 98 F (36.7 C) 97.9 F (36.6 C) 97.9 F (36.6 C)  TempSrc:  Oral Oral Oral  SpO2: 95% 100% 100% 100%  Weight:      Height:        Intake/Output Summary (Last 24 hours) at 01/12/2023 1405 Last data filed at 01/12/2023 0835 Gross per 24 hour  Intake 580 ml  Output --  Net 580 ml   Filed Weights   01/11/23 1231 01/11/23 2031 01/12/23 0457  Weight: 99.8 kg 96.8 kg 96.6 kg   Examination:  General exam: Appears sitting up in chair, appears uncomfortable.   Respiratory system: Clear to auscultation. Respiratory effort normal. Cardiovascular system: tachycardic rate; normal S1 &  S2 heard. No JVD, murmurs, rubs, gallops or clicks. 2++ pedal edema. Gastrointestinal system: Abdomen is nondistended, soft and nontender. No organomegaly or masses felt. Normal bowel sounds heard. Central nervous system: Alert and oriented. No focal neurological deficits. Extremities:  1-2+ edema BLEs. Symmetric 5 x 5 power. Skin: No rashes, lesions or ulcers. Psychiatry: Judgement and insight appear normal. Mood & affect appropriate.   Data Reviewed: I have personally reviewed following labs and imaging studies  CBC: Recent Labs  Lab 01/11/23 1309 01/12/23 0404  WBC 7.4 6.6  HGB 8.8* 8.8*  HCT 30.0* 30.5*  MCV 98.4 99.0  PLT 477* 461*    Basic Metabolic Panel: Recent Labs  Lab 01/11/23 1309 01/12/23 0404  NA 141 138  K 3.4* 3.4*  CL 104 104  CO2 25 24  GLUCOSE 112* 129*  BUN 9 9  CREATININE 0.96 0.97  CALCIUM 8.4* 8.0*    CBG: No results for input(s): "GLUCAP" in the last 168 hours.  No results found for this or any previous visit (from the past 240 hour(s)).   Radiology Studies: CT ABDOMEN PELVIS W CONTRAST  Result Date: 01/11/2023 CLINICAL DATA:  Acute abdominal pain. History of lung cancer. * Tracking Code: BO * EXAM: CT ABDOMEN AND PELVIS WITH CONTRAST TECHNIQUE: Multidetector CT imaging of the abdomen and pelvis was performed using the standard protocol following bolus administration of intravenous contrast. RADIATION DOSE REDUCTION: This exam was performed according to the departmental dose-optimization program which includes automated exposure control, adjustment of the mA and/or kV according to patient size and/or use of iterative reconstruction technique. CONTRAST:  OMNIPAQUE IOHEXOL 300 MG/ML  SOLN COMPARISON:  CT chest 01/01/2023 and older. Abdomen pelvis CT 08/07/2022 and older FINDINGS: Lower chest: Increasing bilateral pleural effusions compared to 01/01/2023. The left is complex with a more pleural thickening. Few bubbles of air. Areas actually decreased from the prior examination. Heart is enlarged. Small pericardial effusion. Persistent patchy lung base opacities. Hepatobiliary: As seen previously there is a dilated biliary tree, similar to previous with prior cholecystectomy. There are some benign hepatic cystic foci such as  segment 3, unchanged. However there is a aggressive lesion in the right hepatic lobe which previously was measured at 3.7 x 4.1 cm and today 4.1 x 3.5 cm, similar when adjusting for technique. There is a second lesion more caudal in the right hepatic lobe on the previous examination measuring 3.2 x 3.1 cm which is less well identified but appears present on series 2, image 26 with dimension estimated at 2.5 x 2.4 cm. Focal fat deposition seen in the liver as well adjacent to the falciform ligament in segment 4. Patent portal vein. Pancreas: Mild pancreatic atrophy. Stable ectasia of the pancreatic duct in the head/neck region. Spleen: Normal in size without focal abnormality. Adrenals/Urinary Tract: Adrenal glands are preserved. No enhancing renal mass or collecting system dilatation. Preserved contours of the urinary bladder. Stomach/Bowel: There is new wall thickening with moderate stranding along the ascending colon and transverse colon consistent with a colitis. There is also wall thickening with stranding along the terminal ileum, component of enteritis. Scattered colonic diverticula surgical changes along the mid small bowel and stomach with a gastric bypass including gastrojejunostomy. The excluded portion of the stomach is decompressed. Vascular/Lymphatic: Aortic atherosclerosis. No enlarged abdominal or pelvic lymph nodes. Reproductive: Calcified fibroid in the pelvis. No separate adnexal mass Other: Scattered mesenteric stranding and some dependent free fluid in the pelvis, increased from previous. Rectus muscle diastasis again identified with  anterior abdominal wall wide-mouth hernia involving mesenteric fat and bowel. No obstruction Musculoskeletal: Scattered degenerative changes of the spine and pelvis. Trace anterolisthesis of L4 on L5. Multilevel disc height loss with osteophytes. Disc bulging. Streak artifact related to patient's right hip arthroplasty. IMPRESSION: Interval development of wall  thickening with stranding along the ascending and transverse colon consistent with a colitis. There also involvement of the terminal ileum. Small amount of dependent free fluid in the pelvis. No bowel obstruction. Stable surgical changes of gastric bypass. Scattered colonic diverticula. Stable right hepatic lobe liver masses consistent with known metastatic lesions. Separate hepatic cystic lesions. Compared to the recent chest CT slight increase in bilateral pleural effusions. The air component on the left is decreased. The left appears more complex with pleural thickening. Please correlate with the history and recommend follow-up Electronically Signed   By: Karen Kays M.D.   On: 01/11/2023 17:32   DG Chest Port 1 View  Result Date: 01/11/2023 CLINICAL DATA:  Recent effusions. Nonproductive cough. Shortness of breath. EXAM: PORTABLE CHEST 1 VIEW COMPARISON:  Chest radiograph dated December 31, 2022. CT chest dated January 01, 2023. FINDINGS: The heart size and mediastinal contours are unchanged. Aortic atherosclerosis. Right chest port catheter tip at the level of the distal SVC. Similar size of a loculated left pleural effusion, better evaluated on the prior CT chest dated January 01, 2023, with basilar atelectasis. Persistent post treatment consolidative changes and architectural distortion in the perihilar left lung. Persistent small right pleural effusion. No pneumothorax. The visualized osseous structures are unchanged. IMPRESSION: 1. Similar loculated left pleural effusion and small right pleural effusion, better evaluated on the prior CT chest dated January 01, 2023. 2. Redemonstration of posttreatment parenchymal consolidation and architectural distortion in the left perihilar lung. Electronically Signed   By: Hart Robinsons M.D.   On: 01/11/2023 16:32    Scheduled Meds:  apixaban  5 mg Oral BID   calcium carbonate  400 mg of elemental calcium Oral Daily   carvedilol  3.125 mg Oral BID WC    Chlorhexidine Gluconate Cloth  6 each Topical Daily   cyanocobalamin  2,500 mcg Oral q morning   ferrous sulfate  325 mg Oral QPM   folic acid  1 mg Oral Daily   furosemide  40 mg Oral Daily   gabapentin  600 mg Oral QHS   midodrine  10 mg Oral TID WC   multivitamin with minerals  1 tablet Oral Daily   pantoprazole  40 mg Oral Daily   potassium chloride SA  40 mEq Oral Daily   Continuous Infusions:  sodium chloride 10 mL/hr at 01/12/23 0530   sodium chloride 70 mL/hr at 01/12/23 1345   cefTRIAXone (ROCEPHIN)  IV     metronidazole 500 mg (01/12/23 0532)     LOS: 1 day   Time spent: 45 mins  Charelle Petrakis Laural Benes, MD How to contact the [facility] Attending or Consulting provider 7A - 7P or covering provider during after hours 7P -7A, for this patient?  Check the care team in [facility] and look for a) attending/consulting TRH provider listed and b) the [facility] team listed Log into [email or web address] to find provider on call.  Locate the [facility] provider you are looking for under [facility] and page to a number that you can be directly reached. If you still have difficulty reaching the provider, please page the [facility] (Director on Call) for the Hospitalists listed on amion for assistance.  01/12/2023, 2:05 PM

## 2023-01-12 NOTE — Evaluation (Signed)
Physical Therapy Evaluation Patient Details Name: Denise [name] MRN: [id number] DOB: 04-20-1950 Today's Date: 01/12/2023  History of Present Illness  Denise [name]  is a 72 y.o. female, with history of metastatic/stage IV adenocarcinoma of the lung, chronic toxic respiratory failure on 2 L of oxygen, PE, DM-2, HTN, aortic stenosis with recent hospitalization at [facility] for loculated pleural effusion, required chest tube insertion, did not receive any lytics as chest tube was dislodged prior and repeat CT chest showing stable effusion, and recommendation has been made for prolonged antibiotic course.  -Patient presents to ED secondary to complaints of abdominal pain, started few days ago, as well she did report loose to watery BMs, 4-6 times per day, she does report some nausea, but denies any vomiting, she reports decreased oral intake, denies any melena, hematochezia, orts dyspnea at baseline, on home oxygen, she denies any chest pain, no cough, no fever, no chills.   Clinical Impression  Patient functioning near baseline for functional mobility and gait other than c/o severe pain in legs.  Patient demonstrates good return for bed mobility, transferring to/from chair and commode in bathroom and ambulating in room without problem.  Patient encouraged to ambulate with nursing staff, mobility tech for length of stay.  Plan:  Patient discharged from physical therapy to care of nursing for ambulation daily as tolerated for length of stay.          If plan is discharge home, recommend the following: A little help with walking and/or transfers;A little help with bathing/dressing/bathroom;Help with stairs or ramp for entrance;Assistance with cooking/housework   Can travel by private vehicle        Equipment Recommendations None recommended by PT  Recommendations for Other Services       Functional Status Assessment Patient has had a recent decline in their functional status and/or  demonstrates limited ability to make significant improvements in function in a reasonable and predictable amount of time     Precautions / Restrictions Precautions Precautions: Fall Restrictions Weight Bearing Restrictions: No      Mobility  Bed Mobility Overal bed mobility: Modified Independent                  Transfers Overall transfer level: Modified independent                      Ambulation/Gait Ambulation/Gait assistance: Modified independent (Device/Increase time) Gait Distance (Feet): 20 Feet Assistive device: Rolling walker (2 wheels) Gait Pattern/deviations: Decreased step length - right, Decreased step length - left, Decreased stride length Gait velocity: decreased     General Gait Details: good return for ambulating in room using RW without loss of balance  Stairs            Wheelchair Mobility     Tilt Bed    Modified Rankin (Stroke Patients Only)       Balance Overall balance assessment: Needs assistance Sitting-balance support: Feet supported, No upper extremity supported Sitting balance-Leahy Scale: Good Sitting balance - Comments: seated at EOB   Standing balance support: During functional activity, Bilateral upper extremity supported Standing balance-Leahy Scale: Good Standing balance comment: using RW                             Pertinent Vitals/Pain Pain Assessment Pain Assessment: Faces Faces Pain Scale: Hurts even more Pain Location: BLE Pain Descriptors / Indicators: Sharp, Grimacing, Moaning, Squeezing Pain Intervention(s): Limited activity within  patient's tolerance, Monitored during session, Premedicated before session, Repositioned    Home Living Family/patient expects to be discharged to:: Private residence Living Arrangements: Other relatives Available Help at Discharge: Family;Available PRN/intermittently Type of Home: House Home Access: Ramped entrance       Home Layout: One  level Home Equipment: Shower seat - built in;Grab bars - tub/shower;Lift chair;Cane - single point      Prior Function Prior Level of Function : Independent/Modified Independent             Mobility Comments: Household ambulator using RW, on constant 3-4 LPM Home O2 ADLs Comments: Assisted by family     Extremity/Trunk Assessment   Upper Extremity Assessment Upper Extremity Assessment: Overall WFL for tasks assessed    Lower Extremity Assessment Lower Extremity Assessment: Generalized weakness    Cervical / Trunk Assessment Cervical / Trunk Assessment: Normal  Communication   Communication Communication: No apparent difficulties  Cognition Arousal: Alert Behavior During Therapy: WFL for tasks assessed/performed Overall Cognitive Status: Within Functional Limits for tasks assessed                                          General Comments      Exercises     Assessment/Plan    PT Assessment Patient does not need any further PT services  PT Problem List Pain;Decreased activity tolerance;Decreased mobility       PT Treatment Interventions      PT Goals (Current goals can be found in the Care Plan section)  Acute Rehab PT Goals Patient Stated Goal: return home with family to assist PT Goal Formulation: With patient Time For Goal Achievement: 01/12/23 Potential to Achieve Goals: Good    Frequency       Co-evaluation               AM-PAC PT "6 Clicks" Mobility  Outcome Measure Help needed turning from your back to your side while in a flat bed without using bedrails?: None Help needed moving from lying on your back to sitting on the side of a flat bed without using bedrails?: None Help needed moving to and from a bed to a chair (including a wheelchair)?: None Help needed standing up from a chair using your arms (e.g., wheelchair or bedside chair)?: None Help needed to walk in hospital room?: A Little Help needed climbing 3-5 steps  with a railing? : A Little 6 Click Score: 22    End of Session Equipment Utilized During Treatment: Oxygen Activity Tolerance: Patient tolerated treatment well;Patient limited by fatigue Patient left: in chair;with call bell/phone within reach Nurse Communication: Mobility status PT Visit Diagnosis: Unsteadiness on feet (R26.81);Other abnormalities of gait and mobility (R26.89);Muscle weakness (generalized) (M62.81)    Time: 1610-9604 PT Time Calculation (min) (ACUTE ONLY): 21 min   Charges:   PT Evaluation $PT Eval Moderate Complexity: 1 Mod PT Treatments $Therapeutic Activity: 8-22 mins PT General Charges $$ ACUTE PT VISIT: 1 Visit         2:57 PM, 01/12/23 Ocie Bob, MPT Physical Therapist with [facility] [phone] office [phone] mobile phone

## 2023-01-12 NOTE — Progress Notes (Signed)
EKG obtained per order, Lopressor 2.5mg  IV given per order. Pt with no c/o chest pain or SOB at this time.

## 2023-01-12 NOTE — Progress Notes (Signed)
OT Cancellation Note  Patient Details Name: Denise [name] MRN: [id number] DOB: 10-11-1950   Cancelled Treatment:    Reason Eval/Treat Not Completed: Pain limiting ability to participate. This therapist was about to enter the pt's room when physical therapy left the room and reported the pt is declining mobility due to pain. PT reportedly stated she has been up moving about the room. Will attempt evaluation/brief screen later as time permits.   Saivion Goettel OT, MOT  Danie Chandler 01/12/2023, 9:30 AM

## 2023-01-12 NOTE — Progress Notes (Signed)
   01/12/23 0839  Assess: MEWS Score  Temp 98 F (36.7 C)  BP (!) 121/96  Level of Consciousness Alert  Assess: MEWS Score  MEWS Temp 0  MEWS Systolic 0  MEWS Pulse 3  MEWS RR 1  MEWS LOC 0  MEWS Score 4  MEWS Score Color Red  Assess: if the MEWS score is Yellow or Red  Were vital signs accurate and taken at a resting state? Yes  Does the patient meet 2 or more of the SIRS criteria? No  Does the patient have a confirmed or suspected source of infection? No  MEWS guidelines implemented  Yes, red  Treat  MEWS Interventions Considered administering scheduled or prn medications/treatments as ordered  Take Vital Signs  Increase Vital Sign Frequency  Red: Q1hr x2, continue Q4hrs until patient remains green for 12hrs  Escalate  MEWS: Escalate Red: Discuss with charge nurse and notify provider. Consider notifying RRT. If remains red for 2 hours consider need for higher level of care  Notify: Charge Nurse/RN  Name of Charge Nurse/RN Notified Sharol Roussel RN  Provider Notification  Provider Name/Title Dr. Laural Benes  Date Provider Notified 01/12/23  Time Provider Notified [phone]  Method of Notification Face-to-face  Notification Reason Other (Comment) (increased vitals)  Provider response See new orders  Date of Provider Response 01/12/23  Time of Provider Response 0900  Assess: SIRS CRITERIA  SIRS Temperature  0  SIRS Pulse 1  SIRS Respirations  1  SIRS WBC 0  SIRS Score Sum  2

## 2023-01-12 NOTE — Hospital Course (Signed)
72 y.o. female, with history of metastatic/stage IV adenocarcinoma of the lung, chronic toxic respiratory failure on 2 L of oxygen, PE, DM-2, HTN, aortic stenosis with recent hospitalization at [facility] for loculated pleural effusion, required chest tube insertion, did not receive any lytics as chest tube was dislodged prior and repeat CT chest showing stable effusion, and recommendation has been made for prolonged antibiotic course. -Patient presents to ED secondary to complaints of abdominal pain, started few days ago, as well she did report loose to watery BMs, 4-6 times per day, she does report some nausea, but denies any vomiting, she reports decreased oral intake, denies any melena, hematochezia, orts dyspnea at baseline, on home oxygen, she denies any chest pain, no cough, no fever, no chills. -ED CT abdomen pelvis showing stable liver metastasis, but showing new ascending and transverse colitis, labs were significant for white blood cell of 7.4, hemoglobin stable at 8.8, creatinine stable at 0.96, potassium low at 3.4, patient received IV Rocephin and Flagyl and [facility] hospitalist consulted to admit.

## 2023-01-13 DIAGNOSIS — I10 Essential (primary) hypertension: Secondary | ICD-10-CM | POA: Diagnosis not present

## 2023-01-13 DIAGNOSIS — I4891 Unspecified atrial fibrillation: Secondary | ICD-10-CM | POA: Diagnosis not present

## 2023-01-13 DIAGNOSIS — C3492 Malignant neoplasm of unspecified part of left bronchus or lung: Secondary | ICD-10-CM | POA: Diagnosis not present

## 2023-01-13 DIAGNOSIS — K529 Noninfective gastroenteritis and colitis, unspecified: Secondary | ICD-10-CM | POA: Diagnosis not present

## 2023-01-13 LAB — MAGNESIUM: Magnesium: 1.5 mg/dL — ABNORMAL LOW (ref 1.7–2.4)

## 2023-01-13 MED ORDER — MAGNESIUM SULFATE 4 GM/100ML IV SOLN
4.0000 g | Freq: Once | INTRAVENOUS | Status: AC
  Administered 2023-01-13: 4 g via INTRAVENOUS
  Filled 2023-01-13: qty 100

## 2023-01-13 NOTE — Progress Notes (Signed)
PROGRESS NOTE   Denise [name]  AVW:[id number] DOB: Aug 26, 1950 DOA: 01/11/2023 PCP: Mirna Mires, MD   Chief Complaint  Patient presents with   Abdominal Pain   Level of care: Telemetry  Brief Admission History:  72 y.o. female, with history of metastatic/stage IV adenocarcinoma of the lung, chronic toxic respiratory failure on 2 L of oxygen, PE, DM-2, HTN, aortic stenosis with recent hospitalization at [facility] for loculated pleural effusion, required chest tube insertion, did not receive any lytics as chest tube was dislodged prior and repeat CT chest showing stable effusion, and recommendation has been made for prolonged antibiotic course. -Patient presents to ED secondary to complaints of abdominal pain, started few days ago, as well she did report loose to watery BMs, 4-6 times per day, she does report some nausea, but denies any vomiting, she reports decreased oral intake, denies any melena, hematochezia, orts dyspnea at baseline, on home oxygen, she denies any chest pain, no cough, no fever, no chills. -ED CT abdomen pelvis showing stable liver metastasis, but showing new ascending and transverse colitis, labs were significant for white blood cell of 7.4, hemoglobin stable at 8.8, creatinine stable at 0.96, potassium low at 3.4, patient received IV Rocephin and Flagyl and [facility] hospitalist consulted to admit.   Assessment and Plan:  Generalized Abd pain Ascending/transverse colon colitis -Presented with abdominal pain, nausea, imaging significant for ascending and transverse colon colitis -Continue with IV Rocephin, and Flagyl -C. Difficile test expired before collection   Paroxysmal A-fib -continue with Coreg for heart rate control, continue with Eliquis for anticoagulation -increased carvedilol dose to 6.25 mg BID with holding parameters    Hypertension -Continue with Coreg 6.25 mg BID, mainly for heart rate control -Continue with midodrine   Chronic diastolic  CHF -she appears to be a euvolemic, continue with home dose Lasix for now   Hypokalemia/Hypomagnesemia - oral replacement ordered - recheck in AM after replacement   Chronic respiratory failure with hypoxia Left loculated pleural effusion -He is at baseline on 2 L nasal cannula -Chest x-ray showing stable left little pleural effusion   Aortic stenosis chronic issue.   Normocytic anemia Likely due to underlying malignancy No evidence of blood loss Hemoglobin at baseline   Metastatic adenocarcinoma of the lung Follow-up with oncology in the outpatient setting   History of unprovoked pulmonary embolism Continue with anticoagulation   Obesity class II Body mass index is 39.03 kg/m.   DVT Prophylaxis Eliquis  DVT prophylaxis: apixaban Code Status: Full  Family Communication:  Disposition: Status is: Inpatient   Consultants:   Procedures:   Antimicrobials:  CTX 10/24>> Metronidazole 10/24>>  Subjective: Reports much less leg pain now that she is back on gabapentin.  Abdominal pain much improved.   Objective: Vitals:   01/13/23 0322 01/13/23 0824 01/13/23 1204 01/13/23 1614  BP: (!) 119/58 118/66 (!) 85/60 (!) 101/42  Pulse: (!) 114 (!) 129 85 93  Resp: 20 20  17   Temp: 98.1 F (36.7 C) 98.2 F (36.8 C) 98.3 F (36.8 C)   TempSrc: Oral Oral Oral   SpO2: 100% 100% 100% 90%  Weight:      Height:        Intake/Output Summary (Last 24 hours) at 01/13/2023 1642 Last data filed at 01/13/2023 1230 Gross per 24 hour  Intake 1808.91 ml  Output --  Net 1808.91 ml   Filed Weights   01/11/23 1231 01/11/23 2031 01/12/23 0457  Weight: 99.8 kg 96.8 kg 96.6 kg  Examination:  General exam: Appears sitting up in chair, appears uncomfortable.   Respiratory system: Clear to auscultation. Respiratory effort normal. Cardiovascular system: tachycardic rate; normal S1 & S2 heard. No JVD, murmurs, rubs, gallops or clicks. 2++ pedal edema. Gastrointestinal system:  Abdomen is nondistended, soft and nontender. No organomegaly or masses felt. Normal bowel sounds heard. Central nervous system: Alert and oriented. No focal neurological deficits. Extremities: 1-2+ edema BLEs. Symmetric 5 x 5 power. Skin: No rashes, lesions or ulcers. Psychiatry: Judgement and insight appear normal. Mood & affect appropriate.   Data Reviewed: I have personally reviewed following labs and imaging studies  CBC: Recent Labs  Lab 01/11/23 1309 01/12/23 0404  WBC 7.4 6.6  HGB 8.8* 8.8*  HCT 30.0* 30.5*  MCV 98.4 99.0  PLT 477* 461*    Basic Metabolic Panel: Recent Labs  Lab 01/11/23 1309 01/12/23 0404 01/13/23 0807  NA 141 138  --   K 3.4* 3.4*  --   CL 104 104  --   CO2 25 24  --   GLUCOSE 112* 129*  --   BUN 9 9  --   CREATININE 0.96 0.97  --   CALCIUM 8.4* 8.0*  --   MG  --   --  1.5*    CBG: No results for input(s): "GLUCAP" in the last 168 hours.  No results found for this or any previous visit (from the past 240 hour(s)).   Radiology Studies: No results found.  Scheduled Meds:  apixaban  5 mg Oral BID   calcium carbonate  400 mg of elemental calcium Oral Daily   carvedilol  6.25 mg Oral BID WC   Chlorhexidine Gluconate Cloth  6 each Topical Daily   cyanocobalamin  2,500 mcg Oral q morning   ferrous sulfate  325 mg Oral QPM   folic acid  1 mg Oral Daily   furosemide  40 mg Oral Daily   gabapentin  600 mg Oral QHS   midodrine  10 mg Oral TID WC   multivitamin with minerals  1 tablet Oral Daily   pantoprazole  40 mg Oral Daily   potassium chloride SA  40 mEq Oral Daily   Continuous Infusions:  cefTRIAXone (ROCEPHIN)  IV 2 g (01/12/23 1800)   magnesium sulfate bolus IVPB 4 g (01/13/23 1610)   metronidazole 500 mg (01/13/23 0629)     LOS: 2 days   Time spent: 43 mins  Anabel Lykins Laural Benes, MD How to contact the [facility] Attending or Consulting provider 7A - 7P or covering provider during after hours 7P -7A, for this patient?  Check the care  team in [facility] and look for a) attending/consulting TR[facility] provider listed and b) the [facility] team listed Log into [email or web address] to find provider on call.  Locate the [facility] provider you are looking for under [facility] and page to a number that you can be directly reached. If you still have difficulty reaching the provider, please page the [facility] (Director on Call) for the Hospitalists listed on amion for assistance.  01/13/2023, 4:42 PM

## 2023-01-13 NOTE — Progress Notes (Signed)
Dr. Theodosia Quay notified face to face in patient room that patient c/o pain, increasing swelling to right lower leg.

## 2023-01-14 DIAGNOSIS — I4891 Unspecified atrial fibrillation: Secondary | ICD-10-CM | POA: Diagnosis not present

## 2023-01-14 DIAGNOSIS — I10 Essential (primary) hypertension: Secondary | ICD-10-CM | POA: Diagnosis not present

## 2023-01-14 DIAGNOSIS — C3492 Malignant neoplasm of unspecified part of left bronchus or lung: Secondary | ICD-10-CM | POA: Diagnosis not present

## 2023-01-14 DIAGNOSIS — K529 Noninfective gastroenteritis and colitis, unspecified: Secondary | ICD-10-CM | POA: Diagnosis not present

## 2023-01-14 LAB — BASIC METABOLIC PANEL
Anion gap: 10 (ref 5–15)
BUN: 10 mg/dL (ref 8–23)
CO2: 24 mmol/L (ref 22–32)
Calcium: 8.2 mg/dL — ABNORMAL LOW (ref 8.9–10.3)
Chloride: 103 mmol/L (ref 98–111)
Creatinine, Ser: 0.88 mg/dL (ref 0.44–1.00)
GFR, Estimated: >60 mL/min (ref >60)
Glucose, Bld: 92 mg/dL (ref 70–99)
Potassium: 3.5 mmol/L (ref 3.5–5.1)
Sodium: 137 mmol/L (ref 135–145)

## 2023-01-14 LAB — MAGNESIUM: Magnesium: 2.1 mg/dL (ref 1.7–2.4)

## 2023-01-14 MED ORDER — SACCHAROMYCES BOULARDII 250 MG PO CAPS
250.0000 mg | ORAL_CAPSULE | Freq: Two times a day (BID) | ORAL | Status: DC
  Administered 2023-01-14 – 2023-01-15 (×3): 250 mg via ORAL
  Filled 2023-01-14 (×3): qty 1

## 2023-01-14 MED ORDER — FUROSEMIDE 40 MG PO TABS: 40.0000 mg | ORAL_TABLET | Freq: Every day | ORAL | Status: DC

## 2023-01-14 MED ORDER — FUROSEMIDE 40 MG PO TABS
40.0000 mg | ORAL_TABLET | Freq: Every day | ORAL | Status: DC
  Administered 2023-01-14 – 2023-01-15 (×2): 40 mg via ORAL
  Filled 2023-01-14 (×2): qty 1

## 2023-01-14 NOTE — Plan of Care (Signed)
  Problem: Activity: Goal: Risk for activity intolerance will decrease Outcome: Progressing   Problem: Coping: Goal: Level of anxiety will decrease Outcome: Progressing   Problem: Pain Management: Goal: General experience of comfort will improve Outcome: Progressing

## 2023-01-14 NOTE — Progress Notes (Signed)
Pt BP low, 92/58, asymptomatic, states that she can not tell when her BP is low.  Dr Carren Rang made aware, ordered to encourage fluids and monitor.

## 2023-01-14 NOTE — Progress Notes (Addendum)
PROGRESS NOTE   Denise [name]  WJX:[id number] DOB: Oct 23, 1950 DOA: 01/11/2023 PCP: Mirna Mires, MD   Chief Complaint  Patient presents with   Abdominal Pain   Level of care: Telemetry  Brief Admission History:  72 y.o. female, with history of metastatic/stage IV adenocarcinoma of the lung, chronic toxic respiratory failure on 2 L of oxygen, PE, DM-2, HTN, aortic stenosis with recent hospitalization at [facility] for loculated pleural effusion, required chest tube insertion, did not receive any lytics as chest tube was dislodged prior and repeat CT chest showing stable effusion, and recommendation has been made for prolonged antibiotic course. -Patient presents to ED secondary to complaints of abdominal pain, started few days ago, as well she did report loose to watery BMs, 4-6 times per day, she does report some nausea, but denies any vomiting, she reports decreased oral intake, denies any melena, hematochezia, orts dyspnea at baseline, on home oxygen, she denies any chest pain, no cough, no fever, no chills. -ED CT abdomen pelvis showing stable liver metastasis, but showing new ascending and transverse colitis, labs were significant for white blood cell of 7.4, hemoglobin stable at 8.8, creatinine stable at 0.96, potassium low at 3.4, patient received IV Rocephin and Flagyl and [facility] hospitalist consulted to admit.   Assessment and Plan:  Generalized Abd pain Ascending/transverse colon colitis -Presented with abdominal pain, nausea, imaging significant for ascending and transverse colon colitis -Continue with IV Rocephin, and Flagyl -C. Difficile test expired before collection -probiotics added    Paroxysmal A-fib -continue with Coreg for heart rate control, continue with Eliquis for anticoagulation -increased carvedilol dose to 6.25 mg BID with holding parameters, continue if BP tolerates   Hypertension -Continue with Coreg 6.25 mg BID, mainly for heart rate control -Continue  with midodrine for hypotension management   Chronic diastolic CHF -she appears to be a euvolemic, continue with home dose Lasix for now   Hypokalemia/Hypomagnesemia - oral and IV replacement ordered - recheck in AM after replacement  - mg and K repleted   Chronic respiratory failure with hypoxia Left loculated pleural effusion -He is at baseline on 2 L nasal cannula -Chest x-ray showing stable left little pleural effusion  Chronic venous stasis edema BLEs - Right greater than left - pt reports no missed doses of apixaban  - pt says she normally wears compression hoses at home  - encouraged elevation of legs   Aortic stenosis chronic issue.   Normocytic anemia Likely due to underlying malignancy No evidence of blood loss Hemoglobin at baseline   Metastatic adenocarcinoma of the lung Follow-up with oncology in the outpatient setting   History of unprovoked pulmonary embolism Continue with full anticoagulation with apixaban    Obesity class II Body mass index is 39.03 kg/m.   DVT Prophylaxis Eliquis  DVT prophylaxis: apixaban Code Status: Full  Family Communication:  Disposition: Status is: Inpatient   Consultants:   Procedures:   Antimicrobials:  CTX 10/24>> Metronidazole 10/24>>  Subjective: Chronic venous stasis edema R>L, painful right leg at times, no SOB. Abd pain has been intermittent.    Objective: Vitals:   01/13/23 2138 01/13/23 2155 01/14/23 0500 01/14/23 0922  BP: (!) 83/35 (!) 92/58 (!) 89/56 99/62  Pulse: 85  91 88  Resp: 20     Temp: 98.3 F (36.8 C)  98.6 F (37 C)   TempSrc:      SpO2: 98%   99%  Weight:      Height:  Intake/Output Summary (Last 24 hours) at 01/14/2023 1039 Last data filed at 01/14/2023 0800 Gross per 24 hour  Intake 1612.87 ml  Output --  Net 1612.87 ml   Filed Weights   01/11/23 1231 01/11/23 2031 01/12/23 0457  Weight: 99.8 kg 96.8 kg 96.6 kg   Examination:  General exam: Appears sitting up in  chair, appears uncomfortable.   Respiratory system: Clear to auscultation. Respiratory effort normal. Cardiovascular system: tachycardic rate; normal S1 & S2 heard. No JVD, murmurs, rubs, gallops or clicks. 2++ pedal edema. Gastrointestinal system: Abdomen is nondistended, soft and nontender. No organomegaly or masses felt. Normal bowel sounds heard. Central nervous system: Alert and oriented. No focal neurological deficits. Extremities: 1-2+ edema BLEs R>L. Symmetric 5 x 5 power. Skin: No rashes, lesions or ulcers. Psychiatry: Judgement and insight appear normal. Mood & affect appropriate.   Data Reviewed: I have personally reviewed following labs and imaging studies  CBC: Recent Labs  Lab 01/11/23 1309 01/12/23 0404  WBC 7.4 6.6  HGB 8.8* 8.8*  HCT 30.0* 30.5*  MCV 98.4 99.0  PLT 477* 461*    Basic Metabolic Panel: Recent Labs  Lab 01/11/23 1309 01/12/23 0404 01/13/23 0807 01/14/23 0407  NA 141 138  --  137  K 3.4* 3.4*  --  3.5  CL 104 104  --  103  CO2 25 24  --  24  GLUCOSE 112* 129*  --  92  BUN 9 9  --  10  CREATININE 0.96 0.97  --  0.88  CALCIUM 8.4* 8.0*  --  8.2*  MG  --   --  1.5* 2.1    CBG: No results for input(s): "GLUCAP" in the last 168 hours.  No results found for this or any previous visit (from the past 240 hour(s)).   Radiology Studies: No results found.  Scheduled Meds:  apixaban  5 mg Oral BID   calcium carbonate  400 mg of elemental calcium Oral Daily   carvedilol  6.25 mg Oral BID WC   Chlorhexidine Gluconate Cloth  6 each Topical Daily   cyanocobalamin  2,500 mcg Oral q morning   ferrous sulfate  325 mg Oral QPM   folic acid  1 mg Oral Daily   furosemide  40 mg Oral Daily   gabapentin  600 mg Oral QHS   midodrine  10 mg Oral TID WC   multivitamin with minerals  1 tablet Oral Daily   pantoprazole  40 mg Oral Daily   potassium chloride SA  40 mEq Oral Daily   Continuous Infusions:  cefTRIAXone (ROCEPHIN)  IV 2 g (01/13/23 1813)    metronidazole 500 mg (01/14/23 0456)    LOS: 3 days   Time spent: 37 mins  Masayo Fera Laural Benes, MD How to contact the [facility] Attending or Consulting provider 7A - 7P or covering provider during after hours 7P -7A, for this patient?  Check the care team in [facility] and look for a) attending/consulting [facility] provider listed and b) the [facility] team listed Log into [email or web address] to find provider on call.  Locate the [facility] provider you are looking for under [facility] and page to a number that you can be directly reached. If you still have difficulty reaching the provider, please page the [facility] (Director on Call) for the Hospitalists listed on amion for assistance.  01/14/2023, 10:39 AM

## 2023-01-14 NOTE — Plan of Care (Signed)

## 2023-01-15 ENCOUNTER — Other Ambulatory Visit: Payer: Self-pay

## 2023-01-15 DIAGNOSIS — I10 Essential (primary) hypertension: Secondary | ICD-10-CM | POA: Diagnosis not present

## 2023-01-15 DIAGNOSIS — I4891 Unspecified atrial fibrillation: Secondary | ICD-10-CM | POA: Diagnosis not present

## 2023-01-15 DIAGNOSIS — K529 Noninfective gastroenteritis and colitis, unspecified: Secondary | ICD-10-CM | POA: Diagnosis not present

## 2023-01-15 DIAGNOSIS — C3492 Malignant neoplasm of unspecified part of left bronchus or lung: Secondary | ICD-10-CM | POA: Diagnosis not present

## 2023-01-15 MED ORDER — HEPARIN SOD (PORK) LOCK FLUSH 100 UNIT/ML IV SOLN
500.0000 [IU] | Freq: Once | INTRAVENOUS | Status: DC
  Filled 2023-01-15: qty 5

## 2023-01-15 MED ORDER — METRONIDAZOLE 500 MG PO TABS
500.0000 mg | ORAL_TABLET | Freq: Two times a day (BID) | ORAL | 0 refills | Status: AC
Start: 2023-01-15 — End: 2023-01-20

## 2023-01-15 MED ORDER — CARVEDILOL 6.25 MG PO TABS: 6.2500 mg | ORAL_TABLET | Freq: Two times a day (BID) | ORAL | 1 refills | Status: DC

## 2023-01-15 MED ORDER — CIPROFLOXACIN HCL 500 MG PO TABS
500.0000 mg | ORAL_TABLET | Freq: Two times a day (BID) | ORAL | 0 refills | Status: AC
Start: 2023-01-15 — End: 2023-01-20

## 2023-01-15 NOTE — Discharge Summary (Addendum)
Date: 12/26/2022 CLINICAL DATA:  Pleural effusion. EXAM: PORTABLE CHEST 1 VIEW COMPARISON:  Chest radiograph 12/25/2022 and CT 12/24/2022 FINDINGS: A right jugular Port-A-Cath remains in place, terminating over the mid SVC. The left heart border remains obscured. Aortic atherosclerosis is noted. There is a persistent moderate left pleural effusion with apparent mild differences compared to yesterday's radiograph potentially due to differences in patient positioning. Left suprahilar lung opacity has not significantly changed in the setting of previous radiation for lung cancer. Left lower lung airspace opacities are stable to mildly improved. Hazy and patchy airspace opacities throughout the right lung have not significantly changed, and there is likely a persistent small right pleural effusion. No  pneumothorax is identified. IMPRESSION: 1. Persistent moderate left pleural effusion with stable to mildly improved left lung aeration. 2. Unchanged right lung infiltrates. Electronically Signed   By: Sebastian Ache M.D.   On: 12/26/2022 11:12   ECHOCARDIOGRAM LIMITED  Result Date: 12/25/2022    ECHOCARDIOGRAM LIMITED REPORT   Patient Name:   Denise [name] Date of Exam: 12/25/2022 Medical Rec #:  [id number]   Height:       62.0 in Accession #:    [id number]  Weight:       220.0 lb Date of Birth:  03/15/1951    BSA:          1.991 m Patient Age:    72 years    BP:           106/62 mmHg Patient Gender: F           HR:           112 bpm. [facility] Procedure: Limited Echo Indications:    Atrial Fibrillation I48.91  History:        Patient has prior history of Echocardiogram examinations, most                 recent 07/19/2022. Risk Factors:Hypertension, Diabetes and Former                 Smoker.  Sonographer:    Celesta Gentile RCS Referring Phys: [phone] Heloise Beecham EMOKPAE IMPRESSIONS  1. Limited study.  2. Left ventricular ejection fraction, by estimation, is 65 to 70%. The left ventricle has normal function. The left ventricle has no regional wall motion abnormalities. There is moderate concentric left ventricular hypertrophy. There is the interventricular septum is flattened in systole and diastole, consistent with right ventricular pressure and volume overload.  3. Right ventricular systolic function is normal. The right ventricular size is mildly enlarged.  4. A small pericardial effusion is present. The pericardial effusion is circumferential.  5. The mitral valve is degenerative. Moderate mitral annular calcification.  6. The aortic valve is tricuspid. There is moderate calcification of the aortic valve. Aortic valve appears significantly stenotic - suggest complete interrogation for further assessment.  7. The inferior vena cava is normal in size with greater than 50% respiratory variability,  suggesting right atrial pressure of 3 mmHg. Comparison(s): Prior images reviewed side by side. Difficult to compare given limited study - suggest complete study for further assessment. FINDINGS  Left Ventricle: Left ventricular ejection fraction, by estimation, is 65 to 70%. The left ventricle has normal function. The left ventricle has no regional wall motion abnormalities. The left ventricular internal cavity size was normal in size. There is  moderate concentric left ventricular hypertrophy. The interventricular septum is flattened in systole and diastole, consistent with right ventricular pressure and volume overload.  Physician Discharge Summary  Sreeja Brandell UJW:[id number] DOB: 1951/02/13 DOA: 01/11/2023  PCP: Mirna Mires, MD Oncologist: Dr. Ellin Saba  Admit date: 01/11/2023 Discharge date: 01/15/2023  Admitted From:  HOME  Disposition: HOME with Home health  Recommendations for Outpatient Follow-up:  Follow up with PCP in 1 weeks Follow up with Dr. Ellin Saba as scheduled  Discharge Condition: STABLE   CODE STATUS: FULL DIET: low sodium heart healthy   Home Health: PT, Aide   Brief Hospitalization Summary: Please see all hospital notes, images, labs for full details of the hospitalization. Admission Provider HPI:  72 y.o. female, with history of metastatic/stage IV adenocarcinoma of the lung, chronic toxic respiratory failure on 2 L of oxygen, PE, DM-2, HTN, aortic stenosis with recent hospitalization at [facility] for loculated pleural effusion, required chest tube insertion, did not receive any lytics as chest tube was dislodged prior and repeat CT chest showing stable effusion, and recommendation has been made for prolonged antibiotic course. -Patient presents to ED secondary to complaints of abdominal pain, started few days ago, as well she did report loose to watery BMs, 4-6 times per day, she does report some nausea, but denies any vomiting, she reports decreased oral intake, denies any melena, hematochezia, orts dyspnea at baseline, on home oxygen, she denies any chest pain, no cough, no fever, no chills. -ED CT abdomen pelvis showing stable liver metastasis, but showing new ascending and transverse colitis, labs were significant for white blood cell of 7.4, hemoglobin stable at 8.8, creatinine stable at 0.96, potassium low at 3.4, patient received IV Rocephin and Flagyl and [facility] hospitalist consulted to admit.  Hospital Course by problem list  Generalized Abd pain Ascending/transverse colon colitis -Presented with abdominal pain, nausea, imaging significant for ascending and  transverse colon colitis -Continue with IV Rocephin, and Flagyl -C. Difficile test expired before collection -probiotics added    Paroxysmal A-fib -continue with Coreg for heart rate control, continue with Eliquis for anticoagulation -increased carvedilol dose to 6.25 mg BID and HR better controlled.   Hypertension -Continue with Coreg 6.25 mg BID, mainly for heart rate control -Continue with midodrine for hypotension management    Chronic diastolic CHF -continue with home dose Lasix    Hypokalemia/Hypomagnesemia - oral and IV replacement ordered - recheck in AM after replacement  - mg and K repleted    Chronic respiratory failure with hypoxia Left loculated pleural effusion -He is at baseline on 2 L nasal cannula -Chest x-ray showing stable left little pleural effusion   Chronic venous stasis edema BLEs - Right greater than left - pt reports no missed doses of apixaban  - pt says she normally wears compression hoses at home  - encouraged elevation of legs   Aortic stenosis chronic issue followed by cardiology   Normocytic anemia Likely due to underlying malignancy No evidence of blood loss Hemoglobin at baseline   Metastatic adenocarcinoma of the lung Follow-up with oncology in the outpatient setting   History of unprovoked pulmonary embolism Continue with full anticoagulation with apixaban    Obesity class II Body mass index is 39.03 kg/m.   Discharge Diagnoses:  Principal Problem:   Colitis Active Problems:   Adenocarcinoma of left lung (HCC)   Type 2 diabetes mellitus (HCC)   Essential hypertension   Class 3 obesity   Atrial fibrillation with RVR [facility])   Discharge Instructions:  Allergies as of 01/15/2023   No Known Allergies      Medication List     TAKE these  Physician Discharge Summary  Sreeja Brandell UJW:[id number] DOB: 1951/02/13 DOA: 01/11/2023  PCP: Mirna Mires, MD Oncologist: Dr. Ellin Saba  Admit date: 01/11/2023 Discharge date: 01/15/2023  Admitted From:  HOME  Disposition: HOME with Home health  Recommendations for Outpatient Follow-up:  Follow up with PCP in 1 weeks Follow up with Dr. Ellin Saba as scheduled  Discharge Condition: STABLE   CODE STATUS: FULL DIET: low sodium heart healthy   Home Health: PT, Aide   Brief Hospitalization Summary: Please see all hospital notes, images, labs for full details of the hospitalization. Admission Provider HPI:  72 y.o. female, with history of metastatic/stage IV adenocarcinoma of the lung, chronic toxic respiratory failure on 2 L of oxygen, PE, DM-2, HTN, aortic stenosis with recent hospitalization at [facility] for loculated pleural effusion, required chest tube insertion, did not receive any lytics as chest tube was dislodged prior and repeat CT chest showing stable effusion, and recommendation has been made for prolonged antibiotic course. -Patient presents to ED secondary to complaints of abdominal pain, started few days ago, as well she did report loose to watery BMs, 4-6 times per day, she does report some nausea, but denies any vomiting, she reports decreased oral intake, denies any melena, hematochezia, orts dyspnea at baseline, on home oxygen, she denies any chest pain, no cough, no fever, no chills. -ED CT abdomen pelvis showing stable liver metastasis, but showing new ascending and transverse colitis, labs were significant for white blood cell of 7.4, hemoglobin stable at 8.8, creatinine stable at 0.96, potassium low at 3.4, patient received IV Rocephin and Flagyl and [facility] hospitalist consulted to admit.  Hospital Course by problem list  Generalized Abd pain Ascending/transverse colon colitis -Presented with abdominal pain, nausea, imaging significant for ascending and  transverse colon colitis -Continue with IV Rocephin, and Flagyl -C. Difficile test expired before collection -probiotics added    Paroxysmal A-fib -continue with Coreg for heart rate control, continue with Eliquis for anticoagulation -increased carvedilol dose to 6.25 mg BID and HR better controlled.   Hypertension -Continue with Coreg 6.25 mg BID, mainly for heart rate control -Continue with midodrine for hypotension management    Chronic diastolic CHF -continue with home dose Lasix    Hypokalemia/Hypomagnesemia - oral and IV replacement ordered - recheck in AM after replacement  - mg and K repleted    Chronic respiratory failure with hypoxia Left loculated pleural effusion -He is at baseline on 2 L nasal cannula -Chest x-ray showing stable left little pleural effusion   Chronic venous stasis edema BLEs - Right greater than left - pt reports no missed doses of apixaban  - pt says she normally wears compression hoses at home  - encouraged elevation of legs   Aortic stenosis chronic issue followed by cardiology   Normocytic anemia Likely due to underlying malignancy No evidence of blood loss Hemoglobin at baseline   Metastatic adenocarcinoma of the lung Follow-up with oncology in the outpatient setting   History of unprovoked pulmonary embolism Continue with full anticoagulation with apixaban    Obesity class II Body mass index is 39.03 kg/m.   Discharge Diagnoses:  Principal Problem:   Colitis Active Problems:   Adenocarcinoma of left lung (HCC)   Type 2 diabetes mellitus (HCC)   Essential hypertension   Class 3 obesity   Atrial fibrillation with RVR [facility])   Discharge Instructions:  Allergies as of 01/15/2023   No Known Allergies      Medication List     TAKE these  Physician Discharge Summary  Sreeja Brandell UJW:[id number] DOB: 1951/02/13 DOA: 01/11/2023  PCP: Mirna Mires, MD Oncologist: Dr. Ellin Saba  Admit date: 01/11/2023 Discharge date: 01/15/2023  Admitted From:  HOME  Disposition: HOME with Home health  Recommendations for Outpatient Follow-up:  Follow up with PCP in 1 weeks Follow up with Dr. Ellin Saba as scheduled  Discharge Condition: STABLE   CODE STATUS: FULL DIET: low sodium heart healthy   Home Health: PT, Aide   Brief Hospitalization Summary: Please see all hospital notes, images, labs for full details of the hospitalization. Admission Provider HPI:  72 y.o. female, with history of metastatic/stage IV adenocarcinoma of the lung, chronic toxic respiratory failure on 2 L of oxygen, PE, DM-2, HTN, aortic stenosis with recent hospitalization at [facility] for loculated pleural effusion, required chest tube insertion, did not receive any lytics as chest tube was dislodged prior and repeat CT chest showing stable effusion, and recommendation has been made for prolonged antibiotic course. -Patient presents to ED secondary to complaints of abdominal pain, started few days ago, as well she did report loose to watery BMs, 4-6 times per day, she does report some nausea, but denies any vomiting, she reports decreased oral intake, denies any melena, hematochezia, orts dyspnea at baseline, on home oxygen, she denies any chest pain, no cough, no fever, no chills. -ED CT abdomen pelvis showing stable liver metastasis, but showing new ascending and transverse colitis, labs were significant for white blood cell of 7.4, hemoglobin stable at 8.8, creatinine stable at 0.96, potassium low at 3.4, patient received IV Rocephin and Flagyl and [facility] hospitalist consulted to admit.  Hospital Course by problem list  Generalized Abd pain Ascending/transverse colon colitis -Presented with abdominal pain, nausea, imaging significant for ascending and  transverse colon colitis -Continue with IV Rocephin, and Flagyl -C. Difficile test expired before collection -probiotics added    Paroxysmal A-fib -continue with Coreg for heart rate control, continue with Eliquis for anticoagulation -increased carvedilol dose to 6.25 mg BID and HR better controlled.   Hypertension -Continue with Coreg 6.25 mg BID, mainly for heart rate control -Continue with midodrine for hypotension management    Chronic diastolic CHF -continue with home dose Lasix    Hypokalemia/Hypomagnesemia - oral and IV replacement ordered - recheck in AM after replacement  - mg and K repleted    Chronic respiratory failure with hypoxia Left loculated pleural effusion -He is at baseline on 2 L nasal cannula -Chest x-ray showing stable left little pleural effusion   Chronic venous stasis edema BLEs - Right greater than left - pt reports no missed doses of apixaban  - pt says she normally wears compression hoses at home  - encouraged elevation of legs   Aortic stenosis chronic issue followed by cardiology   Normocytic anemia Likely due to underlying malignancy No evidence of blood loss Hemoglobin at baseline   Metastatic adenocarcinoma of the lung Follow-up with oncology in the outpatient setting   History of unprovoked pulmonary embolism Continue with full anticoagulation with apixaban    Obesity class II Body mass index is 39.03 kg/m.   Discharge Diagnoses:  Principal Problem:   Colitis Active Problems:   Adenocarcinoma of left lung (HCC)   Type 2 diabetes mellitus (HCC)   Essential hypertension   Class 3 obesity   Atrial fibrillation with RVR [facility])   Discharge Instructions:  Allergies as of 01/15/2023   No Known Allergies      Medication List     TAKE these  Date: 12/26/2022 CLINICAL DATA:  Pleural effusion. EXAM: PORTABLE CHEST 1 VIEW COMPARISON:  Chest radiograph 12/25/2022 and CT 12/24/2022 FINDINGS: A right jugular Port-A-Cath remains in place, terminating over the mid SVC. The left heart border remains obscured. Aortic atherosclerosis is noted. There is a persistent moderate left pleural effusion with apparent mild differences compared to yesterday's radiograph potentially due to differences in patient positioning. Left suprahilar lung opacity has not significantly changed in the setting of previous radiation for lung cancer. Left lower lung airspace opacities are stable to mildly improved. Hazy and patchy airspace opacities throughout the right lung have not significantly changed, and there is likely a persistent small right pleural effusion. No  pneumothorax is identified. IMPRESSION: 1. Persistent moderate left pleural effusion with stable to mildly improved left lung aeration. 2. Unchanged right lung infiltrates. Electronically Signed   By: Sebastian Ache M.D.   On: 12/26/2022 11:12   ECHOCARDIOGRAM LIMITED  Result Date: 12/25/2022    ECHOCARDIOGRAM LIMITED REPORT   Patient Name:   Denise [name] Date of Exam: 12/25/2022 Medical Rec #:  [id number]   Height:       62.0 in Accession #:    [id number]  Weight:       220.0 lb Date of Birth:  03/15/1951    BSA:          1.991 m Patient Age:    72 years    BP:           106/62 mmHg Patient Gender: F           HR:           112 bpm. [facility] Procedure: Limited Echo Indications:    Atrial Fibrillation I48.91  History:        Patient has prior history of Echocardiogram examinations, most                 recent 07/19/2022. Risk Factors:Hypertension, Diabetes and Former                 Smoker.  Sonographer:    Celesta Gentile RCS Referring Phys: [phone] Heloise Beecham EMOKPAE IMPRESSIONS  1. Limited study.  2. Left ventricular ejection fraction, by estimation, is 65 to 70%. The left ventricle has normal function. The left ventricle has no regional wall motion abnormalities. There is moderate concentric left ventricular hypertrophy. There is the interventricular septum is flattened in systole and diastole, consistent with right ventricular pressure and volume overload.  3. Right ventricular systolic function is normal. The right ventricular size is mildly enlarged.  4. A small pericardial effusion is present. The pericardial effusion is circumferential.  5. The mitral valve is degenerative. Moderate mitral annular calcification.  6. The aortic valve is tricuspid. There is moderate calcification of the aortic valve. Aortic valve appears significantly stenotic - suggest complete interrogation for further assessment.  7. The inferior vena cava is normal in size with greater than 50% respiratory variability,  suggesting right atrial pressure of 3 mmHg. Comparison(s): Prior images reviewed side by side. Difficult to compare given limited study - suggest complete study for further assessment. FINDINGS  Left Ventricle: Left ventricular ejection fraction, by estimation, is 65 to 70%. The left ventricle has normal function. The left ventricle has no regional wall motion abnormalities. The left ventricular internal cavity size was normal in size. There is  moderate concentric left ventricular hypertrophy. The interventricular septum is flattened in systole and diastole, consistent with right ventricular pressure and volume overload.  Date: 12/26/2022 CLINICAL DATA:  Pleural effusion. EXAM: PORTABLE CHEST 1 VIEW COMPARISON:  Chest radiograph 12/25/2022 and CT 12/24/2022 FINDINGS: A right jugular Port-A-Cath remains in place, terminating over the mid SVC. The left heart border remains obscured. Aortic atherosclerosis is noted. There is a persistent moderate left pleural effusion with apparent mild differences compared to yesterday's radiograph potentially due to differences in patient positioning. Left suprahilar lung opacity has not significantly changed in the setting of previous radiation for lung cancer. Left lower lung airspace opacities are stable to mildly improved. Hazy and patchy airspace opacities throughout the right lung have not significantly changed, and there is likely a persistent small right pleural effusion. No  pneumothorax is identified. IMPRESSION: 1. Persistent moderate left pleural effusion with stable to mildly improved left lung aeration. 2. Unchanged right lung infiltrates. Electronically Signed   By: Sebastian Ache M.D.   On: 12/26/2022 11:12   ECHOCARDIOGRAM LIMITED  Result Date: 12/25/2022    ECHOCARDIOGRAM LIMITED REPORT   Patient Name:   Denise [name] Date of Exam: 12/25/2022 Medical Rec #:  [id number]   Height:       62.0 in Accession #:    [id number]  Weight:       220.0 lb Date of Birth:  03/15/1951    BSA:          1.991 m Patient Age:    72 years    BP:           106/62 mmHg Patient Gender: F           HR:           112 bpm. [facility] Procedure: Limited Echo Indications:    Atrial Fibrillation I48.91  History:        Patient has prior history of Echocardiogram examinations, most                 recent 07/19/2022. Risk Factors:Hypertension, Diabetes and Former                 Smoker.  Sonographer:    Celesta Gentile RCS Referring Phys: [phone] Heloise Beecham EMOKPAE IMPRESSIONS  1. Limited study.  2. Left ventricular ejection fraction, by estimation, is 65 to 70%. The left ventricle has normal function. The left ventricle has no regional wall motion abnormalities. There is moderate concentric left ventricular hypertrophy. There is the interventricular septum is flattened in systole and diastole, consistent with right ventricular pressure and volume overload.  3. Right ventricular systolic function is normal. The right ventricular size is mildly enlarged.  4. A small pericardial effusion is present. The pericardial effusion is circumferential.  5. The mitral valve is degenerative. Moderate mitral annular calcification.  6. The aortic valve is tricuspid. There is moderate calcification of the aortic valve. Aortic valve appears significantly stenotic - suggest complete interrogation for further assessment.  7. The inferior vena cava is normal in size with greater than 50% respiratory variability,  suggesting right atrial pressure of 3 mmHg. Comparison(s): Prior images reviewed side by side. Difficult to compare given limited study - suggest complete study for further assessment. FINDINGS  Left Ventricle: Left ventricular ejection fraction, by estimation, is 65 to 70%. The left ventricle has normal function. The left ventricle has no regional wall motion abnormalities. The left ventricular internal cavity size was normal in size. There is  moderate concentric left ventricular hypertrophy. The interventricular septum is flattened in systole and diastole, consistent with right ventricular pressure and volume overload.  Date: 12/26/2022 CLINICAL DATA:  Pleural effusion. EXAM: PORTABLE CHEST 1 VIEW COMPARISON:  Chest radiograph 12/25/2022 and CT 12/24/2022 FINDINGS: A right jugular Port-A-Cath remains in place, terminating over the mid SVC. The left heart border remains obscured. Aortic atherosclerosis is noted. There is a persistent moderate left pleural effusion with apparent mild differences compared to yesterday's radiograph potentially due to differences in patient positioning. Left suprahilar lung opacity has not significantly changed in the setting of previous radiation for lung cancer. Left lower lung airspace opacities are stable to mildly improved. Hazy and patchy airspace opacities throughout the right lung have not significantly changed, and there is likely a persistent small right pleural effusion. No  pneumothorax is identified. IMPRESSION: 1. Persistent moderate left pleural effusion with stable to mildly improved left lung aeration. 2. Unchanged right lung infiltrates. Electronically Signed   By: Sebastian Ache M.D.   On: 12/26/2022 11:12   ECHOCARDIOGRAM LIMITED  Result Date: 12/25/2022    ECHOCARDIOGRAM LIMITED REPORT   Patient Name:   Denise [name] Date of Exam: 12/25/2022 Medical Rec #:  [id number]   Height:       62.0 in Accession #:    [id number]  Weight:       220.0 lb Date of Birth:  03/15/1951    BSA:          1.991 m Patient Age:    72 years    BP:           106/62 mmHg Patient Gender: F           HR:           112 bpm. [facility] Procedure: Limited Echo Indications:    Atrial Fibrillation I48.91  History:        Patient has prior history of Echocardiogram examinations, most                 recent 07/19/2022. Risk Factors:Hypertension, Diabetes and Former                 Smoker.  Sonographer:    Celesta Gentile RCS Referring Phys: [phone] Heloise Beecham EMOKPAE IMPRESSIONS  1. Limited study.  2. Left ventricular ejection fraction, by estimation, is 65 to 70%. The left ventricle has normal function. The left ventricle has no regional wall motion abnormalities. There is moderate concentric left ventricular hypertrophy. There is the interventricular septum is flattened in systole and diastole, consistent with right ventricular pressure and volume overload.  3. Right ventricular systolic function is normal. The right ventricular size is mildly enlarged.  4. A small pericardial effusion is present. The pericardial effusion is circumferential.  5. The mitral valve is degenerative. Moderate mitral annular calcification.  6. The aortic valve is tricuspid. There is moderate calcification of the aortic valve. Aortic valve appears significantly stenotic - suggest complete interrogation for further assessment.  7. The inferior vena cava is normal in size with greater than 50% respiratory variability,  suggesting right atrial pressure of 3 mmHg. Comparison(s): Prior images reviewed side by side. Difficult to compare given limited study - suggest complete study for further assessment. FINDINGS  Left Ventricle: Left ventricular ejection fraction, by estimation, is 65 to 70%. The left ventricle has normal function. The left ventricle has no regional wall motion abnormalities. The left ventricular internal cavity size was normal in size. There is  moderate concentric left ventricular hypertrophy. The interventricular septum is flattened in systole and diastole, consistent with right ventricular pressure and volume overload.  Date: 12/26/2022 CLINICAL DATA:  Pleural effusion. EXAM: PORTABLE CHEST 1 VIEW COMPARISON:  Chest radiograph 12/25/2022 and CT 12/24/2022 FINDINGS: A right jugular Port-A-Cath remains in place, terminating over the mid SVC. The left heart border remains obscured. Aortic atherosclerosis is noted. There is a persistent moderate left pleural effusion with apparent mild differences compared to yesterday's radiograph potentially due to differences in patient positioning. Left suprahilar lung opacity has not significantly changed in the setting of previous radiation for lung cancer. Left lower lung airspace opacities are stable to mildly improved. Hazy and patchy airspace opacities throughout the right lung have not significantly changed, and there is likely a persistent small right pleural effusion. No  pneumothorax is identified. IMPRESSION: 1. Persistent moderate left pleural effusion with stable to mildly improved left lung aeration. 2. Unchanged right lung infiltrates. Electronically Signed   By: Sebastian Ache M.D.   On: 12/26/2022 11:12   ECHOCARDIOGRAM LIMITED  Result Date: 12/25/2022    ECHOCARDIOGRAM LIMITED REPORT   Patient Name:   Denise [name] Date of Exam: 12/25/2022 Medical Rec #:  [id number]   Height:       62.0 in Accession #:    [id number]  Weight:       220.0 lb Date of Birth:  03/15/1951    BSA:          1.991 m Patient Age:    72 years    BP:           106/62 mmHg Patient Gender: F           HR:           112 bpm. [facility] Procedure: Limited Echo Indications:    Atrial Fibrillation I48.91  History:        Patient has prior history of Echocardiogram examinations, most                 recent 07/19/2022. Risk Factors:Hypertension, Diabetes and Former                 Smoker.  Sonographer:    Celesta Gentile RCS Referring Phys: [phone] Heloise Beecham EMOKPAE IMPRESSIONS  1. Limited study.  2. Left ventricular ejection fraction, by estimation, is 65 to 70%. The left ventricle has normal function. The left ventricle has no regional wall motion abnormalities. There is moderate concentric left ventricular hypertrophy. There is the interventricular septum is flattened in systole and diastole, consistent with right ventricular pressure and volume overload.  3. Right ventricular systolic function is normal. The right ventricular size is mildly enlarged.  4. A small pericardial effusion is present. The pericardial effusion is circumferential.  5. The mitral valve is degenerative. Moderate mitral annular calcification.  6. The aortic valve is tricuspid. There is moderate calcification of the aortic valve. Aortic valve appears significantly stenotic - suggest complete interrogation for further assessment.  7. The inferior vena cava is normal in size with greater than 50% respiratory variability,  suggesting right atrial pressure of 3 mmHg. Comparison(s): Prior images reviewed side by side. Difficult to compare given limited study - suggest complete study for further assessment. FINDINGS  Left Ventricle: Left ventricular ejection fraction, by estimation, is 65 to 70%. The left ventricle has normal function. The left ventricle has no regional wall motion abnormalities. The left ventricular internal cavity size was normal in size. There is  moderate concentric left ventricular hypertrophy. The interventricular septum is flattened in systole and diastole, consistent with right ventricular pressure and volume overload.  Date: 12/26/2022 CLINICAL DATA:  Pleural effusion. EXAM: PORTABLE CHEST 1 VIEW COMPARISON:  Chest radiograph 12/25/2022 and CT 12/24/2022 FINDINGS: A right jugular Port-A-Cath remains in place, terminating over the mid SVC. The left heart border remains obscured. Aortic atherosclerosis is noted. There is a persistent moderate left pleural effusion with apparent mild differences compared to yesterday's radiograph potentially due to differences in patient positioning. Left suprahilar lung opacity has not significantly changed in the setting of previous radiation for lung cancer. Left lower lung airspace opacities are stable to mildly improved. Hazy and patchy airspace opacities throughout the right lung have not significantly changed, and there is likely a persistent small right pleural effusion. No  pneumothorax is identified. IMPRESSION: 1. Persistent moderate left pleural effusion with stable to mildly improved left lung aeration. 2. Unchanged right lung infiltrates. Electronically Signed   By: Sebastian Ache M.D.   On: 12/26/2022 11:12   ECHOCARDIOGRAM LIMITED  Result Date: 12/25/2022    ECHOCARDIOGRAM LIMITED REPORT   Patient Name:   Denise [name] Date of Exam: 12/25/2022 Medical Rec #:  [id number]   Height:       62.0 in Accession #:    [id number]  Weight:       220.0 lb Date of Birth:  03/15/1951    BSA:          1.991 m Patient Age:    72 years    BP:           106/62 mmHg Patient Gender: F           HR:           112 bpm. [facility] Procedure: Limited Echo Indications:    Atrial Fibrillation I48.91  History:        Patient has prior history of Echocardiogram examinations, most                 recent 07/19/2022. Risk Factors:Hypertension, Diabetes and Former                 Smoker.  Sonographer:    Celesta Gentile RCS Referring Phys: [phone] Heloise Beecham EMOKPAE IMPRESSIONS  1. Limited study.  2. Left ventricular ejection fraction, by estimation, is 65 to 70%. The left ventricle has normal function. The left ventricle has no regional wall motion abnormalities. There is moderate concentric left ventricular hypertrophy. There is the interventricular septum is flattened in systole and diastole, consistent with right ventricular pressure and volume overload.  3. Right ventricular systolic function is normal. The right ventricular size is mildly enlarged.  4. A small pericardial effusion is present. The pericardial effusion is circumferential.  5. The mitral valve is degenerative. Moderate mitral annular calcification.  6. The aortic valve is tricuspid. There is moderate calcification of the aortic valve. Aortic valve appears significantly stenotic - suggest complete interrogation for further assessment.  7. The inferior vena cava is normal in size with greater than 50% respiratory variability,  suggesting right atrial pressure of 3 mmHg. Comparison(s): Prior images reviewed side by side. Difficult to compare given limited study - suggest complete study for further assessment. FINDINGS  Left Ventricle: Left ventricular ejection fraction, by estimation, is 65 to 70%. The left ventricle has normal function. The left ventricle has no regional wall motion abnormalities. The left ventricular internal cavity size was normal in size. There is  moderate concentric left ventricular hypertrophy. The interventricular septum is flattened in systole and diastole, consistent with right ventricular pressure and volume overload.  Physician Discharge Summary  Sreeja Brandell UJW:[id number] DOB: 1951/02/13 DOA: 01/11/2023  PCP: Mirna Mires, MD Oncologist: Dr. Ellin Saba  Admit date: 01/11/2023 Discharge date: 01/15/2023  Admitted From:  HOME  Disposition: HOME with Home health  Recommendations for Outpatient Follow-up:  Follow up with PCP in 1 weeks Follow up with Dr. Ellin Saba as scheduled  Discharge Condition: STABLE   CODE STATUS: FULL DIET: low sodium heart healthy   Home Health: PT, Aide   Brief Hospitalization Summary: Please see all hospital notes, images, labs for full details of the hospitalization. Admission Provider HPI:  72 y.o. female, with history of metastatic/stage IV adenocarcinoma of the lung, chronic toxic respiratory failure on 2 L of oxygen, PE, DM-2, HTN, aortic stenosis with recent hospitalization at [facility] for loculated pleural effusion, required chest tube insertion, did not receive any lytics as chest tube was dislodged prior and repeat CT chest showing stable effusion, and recommendation has been made for prolonged antibiotic course. -Patient presents to ED secondary to complaints of abdominal pain, started few days ago, as well she did report loose to watery BMs, 4-6 times per day, she does report some nausea, but denies any vomiting, she reports decreased oral intake, denies any melena, hematochezia, orts dyspnea at baseline, on home oxygen, she denies any chest pain, no cough, no fever, no chills. -ED CT abdomen pelvis showing stable liver metastasis, but showing new ascending and transverse colitis, labs were significant for white blood cell of 7.4, hemoglobin stable at 8.8, creatinine stable at 0.96, potassium low at 3.4, patient received IV Rocephin and Flagyl and [facility] hospitalist consulted to admit.  Hospital Course by problem list  Generalized Abd pain Ascending/transverse colon colitis -Presented with abdominal pain, nausea, imaging significant for ascending and  transverse colon colitis -Continue with IV Rocephin, and Flagyl -C. Difficile test expired before collection -probiotics added    Paroxysmal A-fib -continue with Coreg for heart rate control, continue with Eliquis for anticoagulation -increased carvedilol dose to 6.25 mg BID and HR better controlled.   Hypertension -Continue with Coreg 6.25 mg BID, mainly for heart rate control -Continue with midodrine for hypotension management    Chronic diastolic CHF -continue with home dose Lasix    Hypokalemia/Hypomagnesemia - oral and IV replacement ordered - recheck in AM after replacement  - mg and K repleted    Chronic respiratory failure with hypoxia Left loculated pleural effusion -He is at baseline on 2 L nasal cannula -Chest x-ray showing stable left little pleural effusion   Chronic venous stasis edema BLEs - Right greater than left - pt reports no missed doses of apixaban  - pt says she normally wears compression hoses at home  - encouraged elevation of legs   Aortic stenosis chronic issue followed by cardiology   Normocytic anemia Likely due to underlying malignancy No evidence of blood loss Hemoglobin at baseline   Metastatic adenocarcinoma of the lung Follow-up with oncology in the outpatient setting   History of unprovoked pulmonary embolism Continue with full anticoagulation with apixaban    Obesity class II Body mass index is 39.03 kg/m.   Discharge Diagnoses:  Principal Problem:   Colitis Active Problems:   Adenocarcinoma of left lung (HCC)   Type 2 diabetes mellitus (HCC)   Essential hypertension   Class 3 obesity   Atrial fibrillation with RVR [facility])   Discharge Instructions:  Allergies as of 01/15/2023   No Known Allergies      Medication List     TAKE these

## 2023-01-15 NOTE — Care Management Important Message (Signed)
Important Message  Patient Details  Name: Denise [name] MRN: [id number] Date of Birth: 1950/07/21   Important Message Given:  Yes - Medicare IM     Corey Harold 01/15/2023, 11:35 AM

## 2023-01-15 NOTE — Consult Note (Signed)
[facility] Liaison Note  01/15/2023  Denise [name] June 15, 1950 [id number]  Location: RN Hospital Liaison screened the patient remotely at [facility].  Insurance: [facility] HMO   Denise [name] is a 72 y.o. female who is a Primary Care Patient of Denise Mires, MD-[place] Medicine. The patient was screened for 30 day readmission hospitalization with noted high risk score for unplanned readmission risk with 2 IP in 6 months.  The patient was assessed for potential Care Management service needs for post hospital transition for care coordination. Review of patient's electronic medical record reveals patient was admitted for Colitis. Liaison spoke with today introducing VBCI for care management services. Verified the oncology team is following pt very closely with visits every 3 weeks. Pt verified [facility] and a supportive family when she returns home. No needs expressed at this time.  Plan: [facility] Liaison will continue to follow progress and disposition to asess for post hospital community care coordination/management needs.  Referral request for community care coordination: anticipate Transitions of Care Team follow up.   VBCI Care Management/Population Health does not replace or interfere with any arrangements made by the Inpatient Transition of Care team.   For questions contact:   Denise Cousin, RN, [facility] Liaison [place]   [facility], Population Health Office Hours MTWF  8:00 am-6:00 pm Direct Dial: [phone] mobile [phone] [Office toll free line] Office Hours are M-F 8:30 - 5 pm [name]

## 2023-01-15 NOTE — Progress Notes (Addendum)
   01/15/23 0527  Assess: MEWS Score  Temp 98.4 F (36.9 C)  BP (!) 97/57  MAP (mmHg) 69  Pulse Rate (!) 119  Resp 18  SpO2 99 %  O2 Device Nasal Cannula  O2 Flow Rate (L/min) 2 L/min  Assess: MEWS Score  MEWS Temp 0  MEWS Systolic 1  MEWS Pulse 2  MEWS RR 0  MEWS LOC 0  MEWS Score 3  MEWS Score Color Yellow  Assess: if the MEWS score is Yellow or Red  Were vital signs accurate and taken at a resting state? Yes  Does the patient meet 2 or more of the SIRS criteria? No  Does the patient have a confirmed or suspected source of infection? No  MEWS guidelines implemented  Yes, yellow  Treat  MEWS Interventions Considered administering scheduled or prn medications/treatments as ordered  Take Vital Signs  Increase Vital Sign Frequency  Yellow: Q2hr x1, continue Q4hrs until patient remains green for 12hrs  Escalate  MEWS: Escalate Yellow: Discuss with charge nurse and consider notifying provider and/or RRT  Notify: Charge Nurse/RN  Name of Charge Nurse/RN Notified Koren Bound  Provider Notification  Provider Name/Title Dr. Carren Rang  Date Provider Notified 01/15/23  Time Provider Notified 0533  Method of Notification Page  Notification Reason Other (Comment) (yellow mews)  Provider response Other (Comment)  Assess: SIRS CRITERIA  SIRS Temperature  0  SIRS Pulse 1  SIRS Respirations  0  SIRS WBC 0  SIRS Score Sum  1   Dr. Carren Rang made aware of new yellow mews,No new orders at this time

## 2023-01-15 NOTE — TOC Transition Note (Signed)
Transition of Care [facility]) - CM/SW Discharge Note   Patient Details  Name: Denise [name] MRN: [id number] Date of Birth: 31-Jan-1951  Transition of Care [facility]) CM/SW Contact:  Leitha Bleak, RN Phone Number: 01/15/2023, 10:37 AM   Clinical Narrative:   Patient discharging home. PT had no follow up, but working with her this morning she needed mod assistance. CM called her sister, she states she can be with her at home during the day, patient lives with her niece Dilana. CM called Dilana, she wants home health PT/AIde, she has noticed she is getting weaker. They have a wheelchair and walker at home for the patient to use. RN updated, family will be here at 2Pm to pick her up. CMS provider options for home health given. Referral sent to [facility] with [facility] for HHPT/Aide. MD aware to order.    Final next level of care: Home w Home Health Services Barriers to Discharge: Barriers Resolved   Patient Goals and CMS Choice CMS Medicare.gov Compare Post Acute Care list provided to:: Patient Represenative (must comment) Choice offered to / list presented to : Adult Children  Discharge Placement    Name of family member notified: Bonita Quin and Dilana Patient and family notified of of transfer: 01/15/23  Discharge Plan and Services Additional resources added to the After Visit Summary for        [facility] Arranged: PT, Nurse's Aide   Date [facility] Agency Contacted: 01/15/23 Time HH Agency Contacted: 1036 Representative spoke with at [facility] Agency: Kandee Keen  Social Determinants of Health (SDOH) Interventions SDOH Screenings   Food Insecurity: No Food Insecurity (01/11/2023)  Housing: Patient Declined (01/11/2023)  Transportation Needs: No Transportation Needs (01/11/2023)  Utilities: Patient Declined (01/11/2023)  Financial Resource Strain: Low Risk  (12/06/2018)  Physical Activity: Inactive (12/06/2018)  Social Connections: Moderately Isolated (12/06/2018)  Stress: No Stress Concern Present (12/09/2018)   Received  from [facility]  Tobacco Use: Medium Risk (01/11/2023)  Health Literacy: Low Risk  (01/01/2022)   Received from [facility]    Readmission Risk Interventions    01/12/2023    3:51 PM 08/09/2021    1:08 PM  Readmission Risk Prevention Plan  Transportation Screening Complete Complete  PCP or Specialist Appt within 3-5 Days Not Complete Complete  HRI or Home Care Consult Complete Complete  Social Work Consult for Recovery Care Planning/Counseling Complete Complete  Palliative Care Screening Not Applicable Not Applicable  Medication Review Oceanographer) Complete Complete

## 2023-01-15 NOTE — Discharge Instructions (Signed)
IMPORTANT INFORMATION: PAY CLOSE ATTENTION  ? ?PHYSICIAN DISCHARGE INSTRUCTIONS ? ?Follow with Primary care provider  Hill, Gerald, MD  and other consultants as instructed by your Hospitalist Physician ? ?SEEK MEDICAL CARE OR RETURN TO EMERGENCY ROOM IF SYMPTOMS COME BACK, WORSEN OR NEW PROBLEM DEVELOPS  ? ?Please note: ?You were cared for by a hospitalist during your hospital stay. Every effort will be made to forward records to your primary care provider.  You can request that your primary care provider send for your hospital records if they have not received them.  Once you are discharged, your primary care physician will handle any further medical issues. Please note that NO REFILLS for any discharge medications will be authorized once you are discharged, as it is imperative that you return to your primary care physician (or establish a relationship with a primary care physician if you do not have one) for your post hospital discharge needs so that they can reassess your need for medications and monitor your lab values. ? ?Please get a complete blood count and chemistry panel checked by your Primary MD at your next visit, and again as instructed by your Primary MD. ? ?Get Medicines reviewed and adjusted: ?Please take all your medications with you for your next visit with your Primary MD ? ?Laboratory/radiological data: ?Please request your Primary MD to go over all hospital tests and procedure/radiological results at the follow up, please ask your primary care provider to get all Hospital records sent to his/her office. ? ?In some cases, they will be blood work, cultures and biopsy results pending at the time of your discharge. Please request that your primary care provider follow up on these results. ? ?If you are diabetic, please bring your blood sugar readings with you to your follow up appointment with primary care.   ? ?Please call and make your follow up appointments as soon as possible.   ? ?Also Note the  following: ?If you experience worsening of your admission symptoms, develop shortness of breath, life threatening emergency, suicidal or homicidal thoughts you must seek medical attention immediately by calling 911 or calling your MD immediately  if symptoms less severe. ? ?You must read complete instructions/literature along with all the possible adverse reactions/side effects for all the Medicines you take and that have been prescribed to you. Take any new Medicines after you have completely understood and accpet all the possible adverse reactions/side effects.  ? ?Do not drive when taking Pain medications or sleeping medications (Benzodiazepines) ? ?Do not take more than prescribed Pain, Sleep and Anxiety Medications. It is not advisable to combine anxiety,sleep and pain medications without talking with your primary care practitioner ? ?Special Instructions: If you have smoked or chewed Tobacco  in the last 2 yrs please stop smoking, stop any regular Alcohol  and or any Recreational drug use. ? ?Wear Seat belts while driving.  Do not drive if taking any narcotic, mind altering or controlled substances or recreational drugs or alcohol.  ? ? ? ? ? ?

## 2023-01-17 ENCOUNTER — Inpatient Hospital Stay: Payer: Medicare HMO

## 2023-01-17 ENCOUNTER — Inpatient Hospital Stay: Payer: Medicare HMO | Admitting: Hematology

## 2023-01-17 DIAGNOSIS — C3492 Malignant neoplasm of unspecified part of left bronchus or lung: Secondary | ICD-10-CM

## 2023-01-22 DIAGNOSIS — R600 Localized edema: Secondary | ICD-10-CM | POA: Diagnosis not present

## 2023-01-22 DIAGNOSIS — I480 Paroxysmal atrial fibrillation: Secondary | ICD-10-CM | POA: Diagnosis not present

## 2023-01-22 DIAGNOSIS — I959 Hypotension, unspecified: Secondary | ICD-10-CM | POA: Diagnosis not present

## 2023-01-22 DIAGNOSIS — J910 Malignant pleural effusion: Secondary | ICD-10-CM | POA: Diagnosis not present

## 2023-01-22 DIAGNOSIS — C3492 Malignant neoplasm of unspecified part of left bronchus or lung: Secondary | ICD-10-CM | POA: Diagnosis not present

## 2023-01-22 DIAGNOSIS — S81802A Unspecified open wound, left lower leg, initial encounter: Secondary | ICD-10-CM | POA: Diagnosis not present

## 2023-01-22 DIAGNOSIS — D630 Anemia in neoplastic disease: Secondary | ICD-10-CM | POA: Diagnosis not present

## 2023-01-23 ENCOUNTER — Inpatient Hospital Stay (HOSPITAL_BASED_OUTPATIENT_CLINIC_OR_DEPARTMENT_OTHER): Payer: Medicare HMO | Admitting: Hematology

## 2023-01-23 ENCOUNTER — Inpatient Hospital Stay: Payer: Medicare HMO

## 2023-01-23 ENCOUNTER — Inpatient Hospital Stay: Payer: Medicare HMO | Attending: Neurosurgery

## 2023-01-23 VITALS — Wt 214.4 lb

## 2023-01-23 DIAGNOSIS — C3492 Malignant neoplasm of unspecified part of left bronchus or lung: Secondary | ICD-10-CM

## 2023-01-23 DIAGNOSIS — I2510 Atherosclerotic heart disease of native coronary artery without angina pectoris: Secondary | ICD-10-CM | POA: Insufficient documentation

## 2023-01-23 DIAGNOSIS — J90 Pleural effusion, not elsewhere classified: Secondary | ICD-10-CM | POA: Insufficient documentation

## 2023-01-23 DIAGNOSIS — I2699 Other pulmonary embolism without acute cor pulmonale: Secondary | ICD-10-CM | POA: Insufficient documentation

## 2023-01-23 DIAGNOSIS — Z7901 Long term (current) use of anticoagulants: Secondary | ICD-10-CM | POA: Insufficient documentation

## 2023-01-23 DIAGNOSIS — C787 Secondary malignant neoplasm of liver and intrahepatic bile duct: Secondary | ICD-10-CM | POA: Insufficient documentation

## 2023-01-23 DIAGNOSIS — C3412 Malignant neoplasm of upper lobe, left bronchus or lung: Secondary | ICD-10-CM | POA: Diagnosis not present

## 2023-01-23 DIAGNOSIS — E119 Type 2 diabetes mellitus without complications: Secondary | ICD-10-CM | POA: Diagnosis not present

## 2023-01-23 DIAGNOSIS — D649 Anemia, unspecified: Secondary | ICD-10-CM | POA: Diagnosis not present

## 2023-01-23 DIAGNOSIS — D329 Benign neoplasm of meninges, unspecified: Secondary | ICD-10-CM | POA: Insufficient documentation

## 2023-01-23 LAB — CBC WITH DIFFERENTIAL/PLATELET
Abs Immature Granulocytes: 0.02 10*3/uL (ref 0.00–0.07)
Basophils Absolute: 0 10*3/uL (ref 0.0–0.1)
Basophils Relative: 1 %
Eosinophils Absolute: 0.1 10*3/uL (ref 0.0–0.5)
Eosinophils Relative: 1 %
HCT: 28.6 % — ABNORMAL LOW (ref 36.0–46.0)
Hemoglobin: 8.5 g/dL — ABNORMAL LOW (ref 12.0–15.0)
Immature Granulocytes: 0 %
Lymphocytes Relative: 10 %
Lymphs Abs: 0.8 10*3/uL (ref 0.7–4.0)
MCH: 28.4 pg (ref 26.0–34.0)
MCHC: 29.7 g/dL — ABNORMAL LOW (ref 30.0–36.0)
MCV: 95.7 fL (ref 80.0–100.0)
Monocytes Absolute: 0.5 10*3/uL (ref 0.1–1.0)
Monocytes Relative: 7 %
Neutro Abs: 6.3 10*3/uL (ref 1.7–7.7)
Neutrophils Relative %: 81 %
Platelets: 516 10*3/uL — ABNORMAL HIGH (ref 150–400)
RBC: 2.99 MIL/uL — ABNORMAL LOW (ref 3.87–5.11)
RDW: 17.7 % — ABNORMAL HIGH (ref 11.5–15.5)
WBC: 7.7 10*3/uL (ref 4.0–10.5)
nRBC: 0.5 % — ABNORMAL HIGH (ref 0.0–0.2)

## 2023-01-23 LAB — COMPREHENSIVE METABOLIC PANEL
ALT: 9 U/L (ref 0–44)
AST: 16 U/L (ref 15–41)
Albumin: 2.2 g/dL — ABNORMAL LOW (ref 3.5–5.0)
Alkaline Phosphatase: 79 U/L (ref 38–126)
Anion gap: 9 (ref 5–15)
BUN: 12 mg/dL (ref 8–23)
CO2: 23 mmol/L (ref 22–32)
Calcium: 8.1 mg/dL — ABNORMAL LOW (ref 8.9–10.3)
Chloride: 105 mmol/L (ref 98–111)
Creatinine, Ser: 1.06 mg/dL — ABNORMAL HIGH (ref 0.44–1.00)
GFR, Estimated: 56 mL/min — ABNORMAL LOW (ref >60)
Glucose, Bld: 122 mg/dL — ABNORMAL HIGH (ref 70–99)
Potassium: 3.6 mmol/L (ref 3.5–5.1)
Sodium: 137 mmol/L (ref 135–145)
Total Bilirubin: 0.4 mg/dL (ref <1.2)
Total Protein: 5.7 g/dL — ABNORMAL LOW (ref 6.5–8.1)

## 2023-01-23 LAB — LIPID PANEL
Cholesterol: 81 mg/dL (ref 0–200)
HDL: 35 mg/dL — ABNORMAL LOW (ref >40)
LDL Cholesterol: 32 mg/dL (ref 0–99)
Total CHOL/HDL Ratio: 2.3 {ratio}
Triglycerides: 72 mg/dL (ref <150)
VLDL: 14 mg/dL (ref 0–40)

## 2023-01-23 LAB — TSH: TSH: 7.375 u[IU]/mL — ABNORMAL HIGH (ref 0.350–4.500)

## 2023-01-23 LAB — MAGNESIUM: Magnesium: 1.3 mg/dL — ABNORMAL LOW (ref 1.7–2.4)

## 2023-01-23 MED ORDER — SODIUM CHLORIDE 0.9% FLUSH
10.0000 mL | INTRAVENOUS | Status: DC | PRN
  Administered 2023-01-23: 10 mL via INTRAVENOUS

## 2023-01-23 MED ORDER — SODIUM CHLORIDE 0.9% FLUSH
10.0000 mL | INTRAVENOUS | Status: AC
  Administered 2023-01-23: 10 mL

## 2023-01-23 MED ORDER — PREDNISONE 20 MG PO TABS: ORAL_TABLET | ORAL | 0 refills | Status: DC

## 2023-01-23 MED ORDER — SODIUM CHLORIDE 0.9 % IV SOLN: INTRAVENOUS | Status: DC

## 2023-01-23 MED ORDER — HEPARIN SOD (PORK) LOCK FLUSH 100 UNIT/ML IV SOLN
500.0000 [IU] | Freq: Once | INTRAVENOUS | Status: AC
  Administered 2023-01-23: 500 [IU] via INTRAVENOUS

## 2023-01-23 MED ORDER — MAGNESIUM SULFATE 4 GM/100ML IV SOLN
4.0000 g | Freq: Once | INTRAVENOUS | Status: AC
  Administered 2023-01-23: 4 g via INTRAVENOUS
  Filled 2023-01-23: qty 100

## 2023-01-23 NOTE — Patient Instructions (Signed)
[place] [facility] - A DEPT OF [facility]  Discharge Instructions: Thank you for choosing [facility] to provide your oncology and hematology care.  If you have a lab appointment with the Cancer Center - please note that after April 8th, 2024, all labs will be drawn in the cancer center.  You do not have to check in or register with the main entrance as you have in the past but will complete your check-in in the cancer center.  Wear comfortable clothing and clothing appropriate for easy access to any Portacath or PICC line.   We strive to give you quality time with your provider. You may need to reschedule your appointment if you arrive late (15 or more minutes).  Arriving late affects you and other patients whose appointments are after yours.  Also, if you miss three or more appointments without notifying the office, you may be dismissed from the clinic at the provider's discretion.      For prescription refill requests, have your pharmacy contact our office and allow 72 hours for refills to be completed.    Today you received 4g IV magnesium sulfate today.   BELOW ARE SYMPTOMS THAT SHOULD BE REPORTED IMMEDIATELY: *FEVER GREATER THAN 100.4 F (38 C) OR HIGHER *CHILLS OR SWEATING *NAUSEA AND VOMITING THAT IS NOT CONTROLLED WITH YOUR NAUSEA MEDICATION *UNUSUAL SHORTNESS OF BREATH *UNUSUAL BRUISING OR BLEEDING *URINARY PROBLEMS (pain or burning when urinating, or frequent urination) *BOWEL PROBLEMS (unusual diarrhea, constipation, pain near the anus) TENDERNESS IN MOUTH AND THROAT WITH OR WITHOUT PRESENCE OF ULCERS (sore throat, sores in mouth, or a toothache) UNUSUAL RASH, SWELLING OR PAIN  UNUSUAL VAGINAL DISCHARGE OR ITCHING   Items with * indicate a potential emergency and should be followed up as soon as possible or go to the Emergency Department if any problems should occur.  Please show the CHEMOTHERAPY ALERT CARD or IMMUNOTHERAPY ALERT CARD  at check-in to the Emergency Department and triage nurse.  Should you have questions after your visit or need to cancel or reschedule your appointment, please contact [place] [facility] - A DEPT OF [facility] [phone]  and follow the prompts.  Office hours are 8:00 a.m. to 4:30 p.m. Monday - Friday. Please note that voicemails left after 4:00 p.m. may not be returned until the following business day.  We are closed weekends and major holidays. You have access to a nurse at all times for urgent questions. Please call the main number to the clinic [phone] and follow the prompts.  For any non-urgent questions, you may also contact your provider using MyChart. We now offer e-Visits for anyone 56 and older to request care online for non-urgent symptoms. For details visit [email or web address].   Also download the MyChart app! Go to the app store, search "MyChart", open the app, select [facility], and log in with your MyChart username and password.

## 2023-01-23 NOTE — Progress Notes (Signed)
[facility] [address] [place] [address]    Clinic Day:  01/23/23   Referring physician: Mirna Mires, MD  Patient Care Team: Mirna Mires, MD as PCP - General (Family Medicine) Jonelle Sidle, MD as PCP - Cardiology (Cardiology) Doreatha Massed, MD as Medical Oncologist (Medical Oncology)   ASSESSMENT & PLAN:   Assessment: 1.  Adenocarcinoma of left lung (HCC) -Chemoradiation therapy with carboplatin and paclitaxel from 01/07/2019 through 02/11/2019. -Consolidation immunotherapy with durvalumab from 03/19/2019 through 04/15/2018, held due to pneumonitis. -CT chest on 07/02/2019 shows left upper lobe lung mass measuring 2.6 x 2.0 cm.  It shows improvement in size. -MRI of the brain on 07/18/2019 showed left sphenoid wing meningioma measuring 2.6 x 2.0 x 2.6 cm unchanged.  No new enhancing intracranial lesion.  Increased left temporal white matter edema. -Durvalumab restarted on 07/24/2019. -She was evaluated by cardiology with a stress test.  EF was 46%.  She underwent cardiac catheterization which did not show any abnormalities. - 1 year of durvalumab completed on 07/23/2020. - Liver mass biopsy (08/08/2021): Adenocarcinoma, CK7 positive, negative for TTF-1, Napsin a, GATA3, ER, CK20, CDX2 - PET scan (08/25/2021): Right upper lobe lung nodule 9 mm, SUV 5.0.  Groundglass and solid nodule periphery of the right upper lobe 8 mm, SUV 2.45.  Right lobe liver mass 4.5 x 3.6 cm, SUV 7.78. - NGS testing: PD-L1 negative, TMB-low, MSI-stable, K-ras G12 D.  No other targetable mutations. - Carboplatin, pemetrexed and pembrolizumab 4 cycles from 09/22/2021 through 11/25/2021, followed by maintenance pemetrexed and pembrolizumab -  CT CAP (11/10/2021): Reduced size of liver mass and interval resolution of previously seen groundglass pulmonary nodules.  There is a new inflammatory right upper lobe nodule.    Plan: 1.  Metastatic adenocarcinoma of the lung to the liver and  right lung: - Last pemetrexed and pembrolizumab on 11/30/2022. - Admitted to the hospital on 12/24/2022 through 01/02/2023 with a left pleural effusion. - Admitted to the hospital from 01/11/2023 through 01/15/2023 with colitis. - Reviewed CTAP from 01/11/2023: Wall thickening and stranding along the ascending and transverse colon consistent with colitis.  There is also involvement of the terminal ileum.  Stable right hepatic lobe liver masses consistent with metastatic lesions. - She was treated with antibiotics. - She continues to have dark brown watery diarrhea 4-5 times per day.  She had minimal improvement since treatment. - Differential diagnosis includes immunotherapy colitis.  Hence I have recommended starting her on prednisone 60 mg daily for 2 weeks followed by 40 mg daily for 2 weeks and then 20 mg daily.  I will see her back in 5 weeks for follow-up. - I will hold her treatment at this time.    2.  Meningioma: - MRI of the brain on 10/02/2022: Stable left sphenoid wing meningioma.  3.  Unprovoked pulmonary embolism: - Continue Eliquis 5 mg daily indefinitely.  No bleeding issues.   4.  Leg swellings: - Continue Lasix 40 mg - 80 mg daily as needed.   5.  Normocytic anemia: - Colonoscopy in July 2024 showed nonbleeding internal hemorrhoids, diverticulosis, 2 mm polyp in transverse colon. - Hemoglobin today is 8.5.  Will closely monitor.  6.  Hypomagnesemia: - Magnesium is low at 1.3 today.  Likely from diarrhea. - She will receive 4 g of IV magnesium. - Will start her on oral magnesium twice daily.    Orders Placed This Encounter  Procedures   CT Chest W Contrast  Standing Status:   Future    Standing Expiration Date:   01/23/2024    Order Specific Question:   If indicated for the ordered procedure, I authorize the administration of contrast media per Radiology protocol    Answer:   Yes    Order Specific Question:   Does the patient have a contrast media/X-ray dye  allergy?    Answer:   No    Order Specific Question:   Preferred imaging location?    Answer:   [facility]   Lipid panel    Standing Status:   Future    Number of Occurrences:   1    Standing Expiration Date:   01/23/2024   Hemoglobin A1c    Standing Status:   Future    Number of Occurrences:   1    Standing Expiration Date:   01/23/2024      Denise [name],acting as a scribe for Doreatha Massed, MD.,have documented all relevant documentation on the behalf of Doreatha Massed, MD,as directed by  Doreatha Massed, MD while in the presence of Doreatha Massed, MD.  I, Doreatha Massed MD, have reviewed the above documentation for accuracy and completeness, and I agree with the above.      Doreatha Massed, MD   11/5/20245:26 PM  CHIEF COMPLAINT:   Diagnosis: stage IV adenocarcinoma of the left lung, negative for targetable mutations    Cancer Staging  Adenocarcinoma of left lung [facility]) Staging form: Lung, AJCC 8th Edition - Clinical stage from 01/02/2019: Stage IIIB (cT3, cN2, cM0) - Signed by Doreatha Massed, MD on 01/02/2019 - Pathologic stage from 09/07/2021: Stage IVA (pTX, pNX, pM1b) - Unsigned    Prior Therapy: 1. Chemoradiation with carboplatin and paclitaxel from 01/07/2019 to 02/11/2019. 2. Consolidation with durvalumab from 03/19/2019 to 04/16/2019, held due to pneumonitis.  Current Therapy:  Pemetrexed and pembrolizumab maintenance    HISTORY OF PRESENT ILLNESS:   Oncology History  Adenocarcinoma of left lung (HCC)  12/09/2018 Initial Diagnosis   Adenocarcinoma of left lung (HCC)   01/02/2019 Cancer Staging   Staging form: Lung, AJCC 8th Edition - Clinical stage from 01/02/2019: Stage IIIB (cT3, cN2, cM0) - Signed by Doreatha Massed, MD on 01/02/2019   01/07/2019 - 02/11/2019 Chemotherapy   The patient had palonosetron (ALOXI) injection 0.25 mg, 0.25 mg, Intravenous,  Once, 6 of 6 cycles Administration: 0.25 mg  (01/07/2019), 0.25 mg (01/14/2019), 0.25 mg (01/21/2019), 0.25 mg (01/28/2019), 0.25 mg (02/04/2019), 0.25 mg (02/11/2019) CARBOplatin (PARAPLATIN) 270 mg in sodium chloride 0.9 % 250 mL chemo infusion, 270 mg (100 % of original dose 266.4 mg), Intravenous,  Once, 6 of 6 cycles Dose modification:   (original dose 266.4 mg, Cycle 1),   (original dose 266.4 mg, Cycle 2),   (original dose 266.4 mg, Cycle 3),   (original dose 266.4 mg, Cycle 4) Administration: 270 mg (01/07/2019), 270 mg (01/14/2019), 270 mg (01/21/2019), 270 mg (01/28/2019), 270 mg (02/04/2019), 270 mg (02/11/2019) PACLitaxel (TAXOL) 108 mg in sodium chloride 0.9 % 250 mL chemo infusion (</= 80mg /m2), 45 mg/m2 = 108 mg, Intravenous,  Once, 6 of 6 cycles Administration: 108 mg (01/07/2019), 108 mg (01/14/2019), 108 mg (01/21/2019), 108 mg (01/28/2019), 108 mg (02/04/2019), 108 mg (02/11/2019) fosaprepitant (EMEND) 150 mg, dexamethasone (DECADRON) 12 mg in sodium chloride 0.9 % 145 mL IVPB, , Intravenous,  Once, 5 of 5 cycles Administration:  (01/14/2019),  (01/21/2019),  (01/28/2019),  (02/04/2019),  (02/11/2019)  for chemotherapy treatment.    03/19/2019 - 07/23/2020 Chemotherapy  Patient is on Treatment Plan : LUNG DURVALUMAB Q14D     09/22/2021 - 11/04/2021 Chemotherapy   Patient is on Treatment Plan : LUNG Carboplatin (5) + Pemetrexed (500) + Pembrolizumab (200) D1 q21d Induction x 4 cycles / Maintenance Pemetrexed (500) + Pembrolizumab (200) D1 q21d     09/22/2021 -  Chemotherapy   Patient is on Treatment Plan : LUNG Carboplatin (5) + Pemetrexed (500) + Pembrolizumab (200) D1 q21d Induction x 4 cycles / Maintenance Pemetrexed (500) + Pembrolizumab (200) D1 q21d        INTERVAL HISTORY:   Denise [name] is a 72 y.o. female presenting to clinic today for follow up of stage IV adenocarcinoma of the left lung. She was last seen by me on 11/30/22.  Since her last visit, she was admitted to the hospital on 12/24/22 for acute on chronic respiratory  failure with hypoxia and left sided pleural effusion. She underwent thoracentesis- 950 cc removed, with workup suggestive of exudate and was transferred to [facility]. She also had pneumonia and was treated with antibiotics. She was discharged on 01/02/23. She was also admitted to the hospital on 01/11/23 for colitis with abdominal pain. She was given IV Rocephin and Flagyl.   Today, she states that she is doing well overall. Her appetite level is at 80%. Her energy level is at 0%.  Her breathing has improved since thoracentesis. She reports severely watery stools that are dark brown in color, occurring 4-5x a day. The maximum amount of times she has gone to the bathroom before hospitalization was 8-9x a day. Her diarrhea has alightly improved since second hospitalization and being treated for colitis. She notes bilateral leg edema, taking lasix 80 mg OD yesterday and today, then taking 40 mg OD after. Dr. Loleta Chance would like her to see a wound care specialist for wounds on bilateral shins after bandages were removed from the area.   She has a decreased appetite and eats one full meal a day with a few fruits throughout the day. Types of foods do not effect diarrhea. She has never taken Magnesium.  PAST MEDICAL HISTORY:   Past Medical History: Past Medical History:  Diagnosis Date   Anemia    Aortic stenosis    Arthritis    Coronary artery calcification seen on CT scan    Dyspnea    Essential hypertension    GERD (gastroesophageal reflux disease)    History of lung cancer    Stage III adenocarcinoma status post chemoradiation   Port-A-Cath in place 01/06/2019   Type 2 diabetes mellitus [facility])     Surgical History: Past Surgical History:  Procedure Laterality Date   BIOPSY  09/25/2022   Procedure: BIOPSY;  Surgeon: Lanelle Bal, DO;  [facility];  Service: Endoscopy;;   CHOLECYSTECTOMY  1997   COLONOSCOPY     COLONOSCOPY WITH PROPOFOL N/A 09/25/2022   Procedure: COLONOSCOPY WITH  PROPOFOL;  Surgeon: Lanelle Bal, DO;  Location: [facility];  Service: Endoscopy;  Laterality: N/A;  11:00;ASA 3   ESOPHAGEAL BRUSHING  09/25/2022   Procedure: ESOPHAGEAL BRUSHING;  Surgeon: Lanelle Bal, DO;  Location: [facility];  Service: Endoscopy;;   ESOPHAGOGASTRODUODENOSCOPY (EGD) WITH PROPOFOL N/A 09/25/2022   Procedure: ESOPHAGOGASTRODUODENOSCOPY (EGD) WITH PROPOFOL;  Surgeon: Lanelle Bal, DO;  Location: [facility];  Service: Endoscopy;  Laterality: N/A;  11:00AM;ASA 3   GASTRIC BYPASS     INCISIONAL HERNIA REPAIR  04/11/11   IR IMAGING GUIDED PORT INSERTION  12/27/2018   Right   IR US GUIDE BX ASP/DRAIN  08/08/2021   LAPAROSCOPIC SALPINGOOPHERECTOMY     LAPAROTOMY  04/11/2011   Procedure: EXPLORATORY LAPAROTOMY;  Surgeon: Clovis Pu. Cornett, MD;  Location: [facility];  Service: General;  Laterality: N/A;  closure port hole   POLYPECTOMY  09/25/2022   Procedure: POLYPECTOMY;  Surgeon: Lanelle Bal, DO;  Location: [facility];  Service: Endoscopy;;   RIGHT/LEFT HEART CATH AND CORONARY ANGIOGRAPHY N/A 12/29/2019   Procedure: RIGHT/LEFT HEART CATH AND CORONARY ANGIOGRAPHY;  Surgeon: Lennette Bihari, MD;  Location: [facility] INVAS[facility] CV [facility];  Service: Cardiovascular;  Laterality: N/A;   TOTAL HIP ARTHROPLASTY  03/07/2012   Procedure: TOTAL HIP ARTHROPLASTY ANTERIOR APPROACH;  Surgeon: Shelda Pal, MD;  Location: [facility];  Service: Orthopedics;  Laterality: Right;   TOTAL SHOULDER ARTHROPLASTY Left 01/21/2015   TOTAL SHOULDER ARTHROPLASTY Left 01/21/2015   Procedure: LEFT TOTAL SHOULDER ARTHROPLASTY;  Surgeon: Francena Hanly, MD;  Location: [facility] OR;  Service: Orthopedics;  Laterality: Left;   VAGINAL HYSTERECTOMY      Social History: Social History   Socioeconomic History   Marital status: Single    Spouse name: Not on file   Number of children: Not on file   Years of education: 12th grade   Highest education level: Not on file  Occupational History   Occupation:  Employed    Employer: [employer]  Tobacco Use   Smoking status: Former    Current packs/day: 0.00    Average packs/day: 1 pack/day for 12.0 years (12.0 ttl pk-yrs)    Types: Cigarettes    Start date: 03/20/1964    Quit date: 03/20/1976    Years since quitting: 46.8    Passive exposure: Past   Smokeless tobacco: Never  Vaping Use   Vaping status: Never Used  Substance and Sexual Activity   Alcohol use: No   Drug use: No   Sexual activity: Not Currently  Other Topics Concern   Not on file  Social History Narrative   Not on file   Social Determinants of Health   Financial Resource Strain: Low Risk  (12/06/2018)   Overall Financial Resource Strain (CARDIA)    Difficulty of Paying Living Expenses: Not very hard  Food Insecurity: No Food Insecurity (01/11/2023)   Hunger Vital Sign    Worried About Running Out of Food in the Last Year: Never true    Ran Out of Food in the Last Year: Never true  Transportation Needs: No Transportation Needs (01/11/2023)   PRAPARE - Administrator, Civil Service (Medical): No    Lack of Transportation (Non-Medical): No  Physical Activity: Inactive (12/06/2018)   Exercise Vital Sign    Days of Exercise per Week: 0 days    Minutes of Exercise per Session: 0 min  Stress: No Stress Concern Present (12/09/2018)   Received from [facility], [facility] of Occupational Health - Occupational Stress Questionnaire    Feeling of Stress : Not at all  Social Connections: Moderately Isolated (12/06/2018)   Social Connection and Isolation Panel [NHANES]    Frequency of Communication with Friends and Family: More than three times a week    Frequency of Social Gatherings with Friends and Family: Once a week    Attends Religious Services: More than 4 times per year    Active Member of [employer] or Organizations: No    Attends Banker Meetings: Never  Marital Status: Never married  Intimate Partner  Violence: Not At Risk (01/11/2023)   Humiliation, Afraid, Rape, and Kick questionnaire    Fear of Current or Ex-Partner: No    Emotionally Abused: No    Physically Abused: No    Sexually Abused: No    Family History: Family History  Problem Relation Age of Onset   Breast cancer Mother    COPD Mother    Arthritis Mother    Diabetes Mother    Hypertension Mother    Hypertension Father    Diabetes Father    Breast cancer Sister    Thyroid cancer Brother    Heart attack Brother    Huntington's disease Maternal Grandmother     Current Medications:  Current Outpatient Medications:    acetaminophen (TYLENOL) 500 MG tablet, Take 500 mg by mouth every 6 (six) hours as needed for moderate pain., Disp: , Rfl:    albuterol (VENTOLIN HFA) 108 (90 Base) MCG/ACT inhaler, Inhale 2 puffs into the lungs every 6 (six) hours as needed for wheezing or shortness of breath., Disp: 6.7 g, Rfl: 0   Calcium Carbonate Antacid (TUMS PO), Take 4 tablets by mouth daily., Disp: , Rfl:    carvedilol (COREG) 6.25 MG tablet, Take 1 tablet (6.25 mg total) by mouth 2 (two) times daily., Disp: 60 tablet, Rfl: 1   Cyanocobalamin (VITAMIN B-12) 2500 MCG SUBL, Place 2,500 mcg under the tongue every morning. , Disp: , Rfl:    ELIQUIS 5 MG TABS tablet, TAKE 1 TABLET BY MOUTH TWICE A DAY, Disp: 60 tablet, Rfl: 6   ferrous sulfate 325 (65 FE) MG tablet, Take 325 mg by mouth every evening., Disp: , Rfl:    folic acid (FOLVITE) 1 MG tablet, TAKE 1 TABLET BY MOUTH EVERY DAY, Disp: 90 tablet, Rfl: 1   furosemide (LASIX) 40 MG tablet, Take 1 tablet (40 mg total) by mouth daily., Disp: 30 tablet, Rfl: 0   gabapentin (NEURONTIN) 300 MG capsule, Take 300 mg by mouth daily., Disp: , Rfl:    gabapentin (NEURONTIN) 600 MG tablet, Take 600 mg by mouth at bedtime., Disp: , Rfl:    Glycerin-Hypromellose-PEG 400 (DRY EYE RELIEF DROPS OP), Apply 1 drop to eye daily as needed (dry eyes)., Disp: , Rfl:    midodrine (PROAMATINE) 10 MG  tablet, Take 1 tablet (10 mg total) by mouth 3 (three) times daily with meals., Disp: 90 tablet, Rfl: 0   Multiple Vitamin (MULITIVITAMIN WITH MINERALS) TABS, Take 1 tablet by mouth daily., Disp: , Rfl:    omeprazole (PRILOSEC) 20 MG capsule, Take 20 mg by mouth 2 (two) times daily before a meal., Disp: , Rfl:    Potassium Chloride ER 20 MEQ TBCR, Take 2 tablets by mouth daily., Disp: , Rfl:    predniSONE (DELTASONE) 20 MG tablet, Take 60 mg by mouth for 2 weeks, then 40 mg by mouth for 2 weeks, then 20 mg daily, Disp: 80 tablet, Rfl: 0 No current facility-administered medications for this visit.  Facility-Administered Medications Ordered in Other Visits:    0.9 %  sodium chloride infusion, , Intravenous, Continuous, Doreatha Massed, MD, Stopped at 01/23/23 1605   magnesium sulfate 2 GM/50ML IVPB, , , ,    sodium chloride flush (NS) 0.9 % injection 10 mL, 10 mL, Intravenous, PRN, Doreatha Massed, MD, 10 mL at 01/23/23 1605   Allergies: No Known Allergies  REVIEW OF SYSTEMS:   Review of Systems  Constitutional:  Negative for chills, fatigue and fever.  HENT:   Negative for lump/mass, mouth sores, nosebleeds, sore throat and trouble swallowing.   Eyes:  Negative for eye problems.  Respiratory:  Positive for shortness of breath. Negative for cough.   Cardiovascular:  Negative for chest pain, leg swelling and palpitations.  Gastrointestinal:  Positive for diarrhea. Negative for abdominal pain, constipation, nausea and vomiting.       +dark stools  Genitourinary:  Negative for bladder incontinence, difficulty urinating, dysuria, frequency, hematuria and nocturia.   Musculoskeletal:  Negative for arthralgias, back pain, flank pain, myalgias and neck pain.       +right leg pain, 9/10 severity  Skin:  Negative for itching and rash.  Neurological:  Positive for numbness (in toes). Negative for dizziness and headaches.  Hematological:  Does not bruise/bleed easily.   Psychiatric/Behavioral:  Negative for depression, sleep disturbance and suicidal ideas. The patient is not nervous/anxious.   All other systems reviewed and are negative.    VITALS:   Weight 214 lb 6.4 oz (97.3 kg).  Wt Readings from Last 3 Encounters:  01/23/23 214 lb 6.4 oz (97.3 kg)  01/12/23 212 lb 15.4 oz (96.6 kg)  12/30/22 220 lb (99.8 kg)    Body mass index is 39.21 kg/m.  Performance status (ECOG): 1 - Symptomatic but completely ambulatory  PHYSICAL EXAM:   Physical Exam Vitals and nursing note reviewed. Exam conducted with a chaperone present.  Constitutional:      Appearance: Normal appearance.  Cardiovascular:     Rate and Rhythm: Normal rate and regular rhythm.     Pulses: Normal pulses.     Heart sounds: Normal heart sounds.  Pulmonary:     Effort: Pulmonary effort is normal.     Breath sounds: Examination of the left-lower field reveals decreased breath sounds. Decreased breath sounds present.  Abdominal:     Palpations: Abdomen is soft. There is no hepatomegaly, splenomegaly or mass.     Tenderness: There is no abdominal tenderness.  Musculoskeletal:     Right lower leg: 1+ Edema present.     Left lower leg: 1+ Edema present.  Lymphadenopathy:     Cervical: No cervical adenopathy.     Right cervical: No superficial, deep or posterior cervical adenopathy.    Left cervical: No superficial, deep or posterior cervical adenopathy.     Upper Body:     Right upper body: No supraclavicular or axillary adenopathy.     Left upper body: No supraclavicular or axillary adenopathy.  Neurological:     General: No focal deficit present.     Mental Status: She is alert and oriented to person, place, and time.  Psychiatric:        Mood and Affect: Mood normal.        Behavior: Behavior normal.     LABS:      Latest Ref Rng & Units 01/23/2023   12:12 PM 01/12/2023    4:04 AM 01/11/2023    1:09 PM  CBC  WBC 4.0 - 10.5 K/uL 7.7  6.6  7.4   Hemoglobin 12.0 -  15.0 g/dL 8.5  8.8  8.8   Hematocrit 36.0 - 46.0 % 28.6  30.5  30.0   Platelets 150 - 400 K/uL 516  461  477       Latest Ref Rng & Units 01/23/2023   12:12 PM 01/14/2023    4:07 AM 01/12/2023    4:04 AM  CMP  Glucose 70 - 99 mg/dL 960  92  454  BUN 8 - 23 mg/dL 12  10  9    Creatinine 0.44 - 1.00 mg/dL 2.44  0.10  2.72   Sodium 135 - 145 mmol/L 137  137  138   Potassium 3.5 - 5.1 mmol/L 3.6  3.5  3.4   Chloride 98 - 111 mmol/L 105  103  104   CO2 22 - 32 mmol/L 23  24  24    Calcium 8.9 - 10.3 mg/dL 8.1  8.2  8.0   Total Protein 6.5 - 8.1 g/dL 5.7     Total Bilirubin <1.2 mg/dL 0.4     Alkaline Phos 38 - 126 U/L 79     AST 15 - 41 U/L 16     ALT 0 - 44 U/L 9        No results found for: "CEA1", "CEA" / No results found for: "CEA1", "CEA" No results found for: "PSA1" No results found for: "ZDG644" No results found for: "CAN125"  No results found for: "TOTALPROTELP", "ALBUMINELP", "A1GS", "A2GS", "BETS", "BETA2SER", "GAMS", "MSPIKE", "SPEI" Lab Results  Component Value Date   TIBC 241 (L) 12/20/2022   TIBC 215 (L) 09/18/2022   TIBC 227 (L) 06/05/2022   FERRITIN 715 (H) 12/20/2022   FERRITIN 596 (H) 09/18/2022   FERRITIN 310 (H) 06/05/2022   IRONPCTSAT 12 12/20/2022   IRONPCTSAT 15 09/18/2022   IRONPCTSAT 17 06/05/2022   Lab Results  Component Value Date   LDH 178 12/29/2022   LDH 185 12/25/2022   LDH 237 (H) 07/17/2022     STUDIES:   CT ABDOMEN PELVIS W CONTRAST  Result Date: 01/11/2023 CLINICAL DATA:  Acute abdominal pain. History of lung cancer. * Tracking Code: BO * EXAM: CT ABDOMEN AND PELVIS WITH CONTRAST TECHNIQUE: Multidetector CT imaging of the abdomen and pelvis was performed using the standard protocol following bolus administration of intravenous contrast. RADIATION DOSE REDUCTION: This exam was performed according to the departmental dose-optimization program which includes automated exposure control, adjustment of the mA and/or kV according to patient  size and/or use of iterative reconstruction technique. CONTRAST:  OMNIPAQUE IOHEXOL 300 MG/ML  SOLN COMPARISON:  CT chest 01/01/2023 and older. Abdomen pelvis CT 08/07/2022 and older FINDINGS: Lower chest: Increasing bilateral pleural effusions compared to 01/01/2023. The left is complex with a more pleural thickening. Few bubbles of air. Areas actually decreased from the prior examination. Heart is enlarged. Small pericardial effusion. Persistent patchy lung base opacities. Hepatobiliary: As seen previously there is a dilated biliary tree, similar to previous with prior cholecystectomy. There are some benign hepatic cystic foci such as segment 3, unchanged. However there is a aggressive lesion in the right hepatic lobe which previously was measured at 3.7 x 4.1 cm and today 4.1 x 3.5 cm, similar when adjusting for technique. There is a second lesion more caudal in the right hepatic lobe on the previous examination measuring 3.2 x 3.1 cm which is less well identified but appears present on series 2, image 26 with dimension estimated at 2.5 x 2.4 cm. Focal fat deposition seen in the liver as well adjacent to the falciform ligament in segment 4. Patent portal vein. Pancreas: Mild pancreatic atrophy. Stable ectasia of the pancreatic duct in the head/neck region. Spleen: Normal in size without focal abnormality. Adrenals/Urinary Tract: Adrenal glands are preserved. No enhancing renal mass or collecting system dilatation. Preserved contours of the urinary bladder. Stomach/Bowel: There is new wall thickening with moderate stranding along the ascending colon and transverse colon consistent with a colitis.  There is also wall thickening with stranding along the terminal ileum, component of enteritis. Scattered colonic diverticula surgical changes along the mid small bowel and stomach with a gastric bypass including gastrojejunostomy. The excluded portion of the stomach is decompressed. Vascular/Lymphatic: Aortic  atherosclerosis. No enlarged abdominal or pelvic lymph nodes. Reproductive: Calcified fibroid in the pelvis. No separate adnexal mass Other: Scattered mesenteric stranding and some dependent free fluid in the pelvis, increased from previous. Rectus muscle diastasis again identified with anterior abdominal wall wide-mouth hernia involving mesenteric fat and bowel. No obstruction Musculoskeletal: Scattered degenerative changes of the spine and pelvis. Trace anterolisthesis of L4 on L5. Multilevel disc height loss with osteophytes. Disc bulging. Streak artifact related to patient's right hip arthroplasty. IMPRESSION: Interval development of wall thickening with stranding along the ascending and transverse colon consistent with a colitis. There also involvement of the terminal ileum. Small amount of dependent free fluid in the pelvis. No bowel obstruction. Stable surgical changes of gastric bypass. Scattered colonic diverticula. Stable right hepatic lobe liver masses consistent with known metastatic lesions. Separate hepatic cystic lesions. Compared to the recent chest CT slight increase in bilateral pleural effusions. The air component on the left is decreased. The left appears more complex with pleural thickening. Please correlate with the history and recommend follow-up Electronically Signed   By: Karen Kays M.D.   On: 01/11/2023 17:32   DG Chest Port 1 View  Result Date: 01/11/2023 CLINICAL DATA:  Recent effusions. Nonproductive cough. Shortness of breath. EXAM: PORTABLE CHEST 1 VIEW COMPARISON:  Chest radiograph dated December 31, 2022. CT chest dated January 01, 2023. FINDINGS: The heart size and mediastinal contours are unchanged. Aortic atherosclerosis. Right chest port catheter tip at the level of the distal SVC. Similar size of a loculated left pleural effusion, better evaluated on the prior CT chest dated January 01, 2023, with basilar atelectasis. Persistent post treatment consolidative changes and  architectural distortion in the perihilar left lung. Persistent small right pleural effusion. No pneumothorax. The visualized osseous structures are unchanged. IMPRESSION: 1. Similar loculated left pleural effusion and small right pleural effusion, better evaluated on the prior CT chest dated January 01, 2023. 2. Redemonstration of posttreatment parenchymal consolidation and architectural distortion in the left perihilar lung. Electronically Signed   By: Hart Robinsons M.D.   On: 01/11/2023 16:32   CT CHEST W CONTRAST  Result Date: 01/01/2023 CLINICAL DATA:  Pleural effusion, concern for empyema. Lung cancer. * Tracking Code: BO * EXAM: CT CHEST WITH CONTRAST TECHNIQUE: Multidetector CT imaging of the chest was performed during intravenous contrast administration. RADIATION DOSE REDUCTION: This exam was performed according to the departmental dose-optimization program which includes automated exposure control, adjustment of the mA and/or kV according to patient size and/or use of iterative reconstruction technique. CONTRAST:  50mL OMNIPAQUE IOHEXOL 350 MG/ML SOLN, 50mL OMNIPAQUE IOHEXOL 350 MG/ML SOLN COMPARISON:  12/24/2022. FINDINGS: Cardiovascular: Poor IV contrast bolus due to tubing malfunction. Right IJ power port terminates at the SVC RA junction. Atherosclerotic calcification of the aorta and aortic valve. Pulmonic trunk and heart are enlarged. There may be a small amount of pericardial fluid. Mediastinum/Nodes: No pathologically enlarged mediastinal or axillary lymph nodes. Hilar regions are difficult to definitively evaluate without IV contrast. Esophagus is grossly unremarkable. Lungs/Pleura: Post treatment parenchymal consolidation in the perihilar left hemithorax. Patchy pulmonary parenchymal ground-glass, septal thickening and minimal consolidation in the right lung. Small to moderate loculated left pleural effusion has decreased in size in the interval with locules of air,  presumably related to  interval intervention. Small partially loculated right pleural effusion, similar. Airway is unremarkable. Upper Abdomen: Probable small right hepatic lobe cyst. Visualized portions of the liver, adrenal glands, kidneys, spleen and pancreas are grossly unremarkable. Gastric bypass. Cholecystectomy. No upper abdominal adenopathy. Musculoskeletal: Degenerative changes in the spine. Left shoulder arthroplasty. Right shoulder degenerative changes. No worrisome lytic or sclerotic lesions. IMPRESSION: 1. Small to moderate loculated left pleural effusion, decreased in size from 12/24/2022. Associated internal locules of air, presumably iatrogenic. Please correlate clinically. Findings suggest empyema. 2. Persistent patchy ground-glass, septal thickening and scattered consolidation in the right lung. Findings may be due to pulmonary edema or pneumonia. 3. Post treatment parenchymal consolidation and architectural distortion in the perihilar left hemithorax. 4. Small partially loculated right pleural effusion, similar. 5.  Aortic atherosclerosis (ICD10-I70.0). 6. Enlarged pulmonic trunk, indicative of pulmonary arterial hypertension. Electronically Signed   By: Leanna Battles M.D.   On: 01/01/2023 16:46   DG CHEST PORT 1 VIEW  Result Date: 12/31/2022 CLINICAL DATA:  Pleural effusion. EXAM: PORTABLE CHEST 1 VIEW COMPARISON:  12/30/2022. FINDINGS: There is significantly improved aeration at the left mid lung. Persistent alveolar consolidation left base and left upper lung. Diffuse pulmonary interstitial prominence consistent with edema. Enlarged cardiac silhouette. No pneumothorax. Right-sided Port-A-Cath tip mid SVC. Drainage catheter in the soft tissues of the left lateral chest wall has been removed. IMPRESSION: Improved aeration left mid lung. Pulmonary vascular congestion. Enlarged cardiac silhouette. Electronically Signed   By: Layla Maw M.D.   On: 12/31/2022 10:39   DG CHEST PORT 1 VIEW  Result Date:  12/30/2022 CLINICAL DATA:  Chest tube in place. EXAM: PORTABLE CHEST 1 VIEW COMPARISON:  One-view chest x-ray 12/28/2022. FINDINGS: A right IJ Port-A-Cath is in place. The heart is enlarged. Atherosclerotic calcifications are present at the aortic arch. Left greater than right pleural effusions are present. A small bore catheter is outside the chest cavity. No pneumothorax is present. IMPRESSION: 1. Left greater than right pleural effusions. 2. Cardiomegaly without failure. 3. Small bore catheter is outside the chest cavity. 4. Right IJ Port-A-Cath. Critical Value/emergent results were called by telephone at the time of interpretation on 12/30/2022 at 2:43 pm to provider Dr. Irma Newness , who verbally acknowledged these results. Electronically Signed   By: Marin Roberts M.D.   On: 12/30/2022 14:44   DG CHEST PORT 1 VIEW  Result Date: 12/29/2022 CLINICAL DATA:  Status post left chest tube placement for pleural effusion. EXAM: PORTABLE CHEST 1 VIEW COMPARISON:  12/28/2022 FINDINGS: Stable cardiac enlargement. Stable appearance of Port-A-Cath. Interval placement of pigtail chest tube in the inferolateral left pleural space. Tube projects just into the pleural space. Diminished pleural fluid. No definite pneumothorax. Bilateral lower lobe atelectasis. IMPRESSION: Interval placement of pigtail chest tube in the inferolateral left pleural space with diminished pleural fluid. No definite pneumothorax. Distal portion of the chest tube just projects into the pleural space by x-ray. Electronically Signed   By: Irish Lack M.D.   On: 12/29/2022 16:38   DG CHEST PORT 1 VIEW  Result Date: 12/28/2022 CLINICAL DATA:  Shortness of breath EXAM: PORTABLE CHEST 1 VIEW COMPARISON:  12/26/2022 FINDINGS: Single frontal view of the chest demonstrates a stable cardiac silhouette. Stable chronic consolidation and volume loss within the left hemithorax consistent with post therapeutic changes in a patient with a known  history of lung cancer. Stable bilateral pleural effusions, left greater than right. No new consolidation. No pneumothorax. Stable right chest wall port. IMPRESSION:  1. Stable bilateral pleural effusions, left greater than right. 2. Stable chronic consolidation within the left lung consistent with post therapeutic changes in a patient with a history of lung cancer. Electronically Signed   By: Sharlet Salina M.D.   On: 12/28/2022 09:11   DG CHEST PORT 1 VIEW  Result Date: 12/26/2022 CLINICAL DATA:  Pleural effusion. EXAM: PORTABLE CHEST 1 VIEW COMPARISON:  Chest radiograph 12/25/2022 and CT 12/24/2022 FINDINGS: A right jugular Port-A-Cath remains in place, terminating over the mid SVC. The left heart border remains obscured. Aortic atherosclerosis is noted. There is a persistent moderate left pleural effusion with apparent mild differences compared to yesterday's radiograph potentially due to differences in patient positioning. Left suprahilar lung opacity has not significantly changed in the setting of previous radiation for lung cancer. Left lower lung airspace opacities are stable to mildly improved. Hazy and patchy airspace opacities throughout the right lung have not significantly changed, and there is likely a persistent small right pleural effusion. No pneumothorax is identified. IMPRESSION: 1. Persistent moderate left pleural effusion with stable to mildly improved left lung aeration. 2. Unchanged right lung infiltrates. Electronically Signed   By: Sebastian Ache M.D.   On: 12/26/2022 11:12   ECHOCARDIOGRAM LIMITED  Result Date: 12/25/2022    ECHOCARDIOGRAM LIMITED REPORT   Patient Name:   Denise [name] Date of Exam: 12/25/2022 Medical Rec #:  [id number]   Height:       62.0 in Accession #:    [id number]  Weight:       220.0 lb Date of Birth:  Dec 10, 1950    BSA:          1.991 m Patient Age:    72 years    BP:           106/62 mmHg Patient Gender: F           HR:           112 bpm. [facility]  Procedure: Limited Echo Indications:    Atrial Fibrillation I48.91  History:        Patient has prior history of Echocardiogram examinations, most                 recent 07/19/2022. Risk Factors:Hypertension, Diabetes and Former                 Smoker.  Sonographer:    Celesta Gentile RCS Referring Phys: [phone] Heloise Beecham EMOKPAE IMPRESSIONS  1. Limited study.  2. Left ventricular ejection fraction, by estimation, is 65 to 70%. The left ventricle has normal function. The left ventricle has no regional wall motion abnormalities. There is moderate concentric left ventricular hypertrophy. There is the interventricular septum is flattened in systole and diastole, consistent with right ventricular pressure and volume overload.  3. Right ventricular systolic function is normal. The right ventricular size is mildly enlarged.  4. A small pericardial effusion is present. The pericardial effusion is circumferential.  5. The mitral valve is degenerative. Moderate mitral annular calcification.  6. The aortic valve is tricuspid. There is moderate calcification of the aortic valve. Aortic valve appears significantly stenotic - suggest complete interrogation for further assessment.  7. The inferior vena cava is normal in size with greater than 50% respiratory variability, suggesting right atrial pressure of 3 mmHg. Comparison(s): Prior images reviewed side by side. Difficult to compare given limited study - suggest complete study for further assessment. FINDINGS  Left Ventricle: Left ventricular ejection fraction, by estimation, is  65 to 70%. The left ventricle has normal function. The left ventricle has no regional wall motion abnormalities. The left ventricular internal cavity size was normal in size. There is  moderate concentric left ventricular hypertrophy. The interventricular septum is flattened in systole and diastole, consistent with right ventricular pressure and volume overload. Right Ventricle: The right ventricular size  is mildly enlarged. No increase in right ventricular wall thickness. Right ventricular systolic function is normal. Pericardium: A small pericardial effusion is present. The pericardial effusion is circumferential. Mitral Valve: The mitral valve is degenerative in appearance. There is mild thickening of the mitral valve leaflet(s). There is mild calcification of the mitral valve leaflet(s). Moderate mitral annular calcification. Aortic Valve: The aortic valve is tricuspid. There is moderate calcification of the aortic valve. Aorta: The aortic root is normal in size and structure. Venous: The inferior vena cava is normal in size with greater than 50% respiratory variability, suggesting right atrial pressure of 3 mmHg. LEFT VENTRICLE PLAX 2D LVIDd:         3.40 cm LVIDs:         2.40 cm LV PW:         1.40 cm LV IVS:        1.40 cm LVOT diam:     1.70 cm LVOT Area:     2.27 cm  RIGHT VENTRICLE TAPSE (M-mode): 1.8 cm LEFT ATRIUM         Index LA diam:    3.20 cm 1.61 cm/m   AORTA Ao Root diam: 3.00 cm  SHUNTS Systemic Diam: 1.70 cm Nona Dell MD Electronically signed by Nona Dell MD Signature Date/Time: 12/25/2022/4:01:55 PM    Final    DG Chest 1 View  Result Date: 12/25/2022 CLINICAL DATA:  Status post thoracentesis. Non-small cell lung cancer. EXAM: CHEST  1 VIEW COMPARISON:  12/24/2022 FINDINGS: Reduced size of the left pleural effusion. No pneumothorax. Right power injectable Port-A-Cath tip: SVC. Atherosclerotic calcification of the aortic arch. Continued indistinctness of the left heart border. Continued left perihilar and lower lobe airspace opacity Mild blunting of the right costophrenic angle compatible with small right pleural effusion. Hazy density in the right upper lobe and right lower lobe compatible with the ground-glass opacities and mild infrahilar airspace opacity shown on yesterday's chest CT. IMPRESSION: 1. Reduced size of the left pleural effusion, without pneumothorax. 2.  Continued left perihilar and lower lobe airspace opacity. 3. Small right pleural effusion. 4. Hazy density in the right upper lobe and right lower lobe compatible with the ground-glass opacities and mild infrahilar airspace opacity shown on yesterday's chest CT. Electronically Signed   By: Gaylyn Rong M.D.   On: 12/25/2022 13:06   US THORACENTESIS ASP PLEURAL SPACE W/IMG GUIDE  Result Date: 12/25/2022 INDICATION: Left pleural effusion. EXAM: ULTRASOUND GUIDED left THORACENTESIS MEDICATIONS: None. COMPLICATIONS: None immediate. PROCEDURE: An ultrasound guided thoracentesis was thoroughly discussed with the patient and questions answered. The benefits, risks, alternatives and complications were also discussed. The patient understands and wishes to proceed with the procedure. Written consent was obtained. Ultrasound was performed to localize and mark an adequate pocket of fluid in the left chest. The area was then prepped and draped in the normal sterile fashion. 1% Lidocaine was used for local anesthesia. Under ultrasound guidance a Safe-T-Centesis catheter was introduced. Thoracentesis was performed. The catheter was removed and a dressing applied. FINDINGS: A total of approximately 850 mL of bloody fluid was removed. Samples were sent to the laboratory as requested by  the clinical team. IMPRESSION: Successful ultrasound guided left thoracentesis yielding 850 mL of pleural fluid. Electronically Signed   By: Lupita Raider M.D.   On: 12/25/2022 10:52   CT Angio Chest PE W and/or Wo Contrast  Result Date: 12/24/2022 CLINICAL DATA:  High probability pulmonary embolism, progressive dyspnea, hypoxia. EXAM: CT ANGIOGRAPHY CHEST WITH CONTRAST TECHNIQUE: Multidetector CT imaging of the chest was performed using the standard protocol during bolus administration of intravenous contrast. Multiplanar CT image reconstructions and MIPs were obtained to evaluate the vascular anatomy. RADIATION DOSE REDUCTION: This  exam was performed according to the departmental dose-optimization program which includes automated exposure control, adjustment of the mA and/or kV according to patient size and/or use of iterative reconstruction technique. CONTRAST:  75mL OMNIPAQUE IOHEXOL 350 MG/ML SOLN COMPARISON:  None Available. FINDINGS: Cardiovascular: Cardiac size is at the upper limits of normal with enlargement of the right ventricle again noted. Extensive calcification of the aortic valve leaflets. No significant coronary artery calcification. Small pericardial effusion is stable. There is adequate opacification of the pulmonary arterial tree. No intraluminal filling defect identified to suggest acute pulmonary embolism through the segmental level. The central pulmonary arteries are mildly enlarged in keeping with changes of pulmonary arterial hypertension, stable since prior examination. Mild atherosclerotic calcifications seen within the thoracic aorta. No aortic aneurysm. Right internal jugular chest port tip noted within the superior vena cava. Mediastinum/Nodes: Visualized thyroid is unremarkable. No pathologic thoracic adenopathy. Esophagus is unremarkable. Lungs/Pleura: Cicatricial changes and chronic consolidation again noted involving the apicoposterior segment of the left upper lobe and left perihilar region in keeping with post radiation change. Large left pleural effusion is seen, enlarged since prior examination, with increasing compressive atelectasis of the left lung. There is interval development of a small left pleural effusion. Extensive asymmetric ground-glass and consolidative infiltrates are noted throughout the right lung, more prevalent within the right upper and right lower lobes likely reflecting changes of acute, multifocal infection in the appropriate clinical setting. No pneumothorax. No central obstructing lesion. Upper Abdomen: Enhancing lesions within the a liver bilaterally, best appreciated within the  right hepatic lobe, are again seen, not well delineated given early phase of contrast enhancement. Dominant lesion measures 3.7 x 3.8 cm, approximately stable since prior examination. No acute abnormality. Musculoskeletal: No acute bone abnormality. No lytic or blastic bone lesion. Review of the MIP images confirms the above findings. IMPRESSION: 1. No pulmonary embolism. 2. Extensive asymmetric ground-glass and consolidative infiltrates throughout the right lung, more prevalent within the right upper and right lower lobes likely reflecting changes of acute, multifocal infection in the appropriate clinical setting. Interval development of small possibly parapneumonic right pleural effusion. 3. Large left pleural effusion, enlarged since prior examination, with increasing compressive atelectasis of the left lung. 4. Stable post radiation changes within the left upper lobe and left perihilar region. 5. Stable enhancing lesions within the liver bilaterally, not well delineated given early phase of contrast enhancement. 6. Stable enlargement of the right ventricle and central pulmonary arteries in keeping with changes of pulmonary arterial hypertension and elevated right heart pressure. Aortic Atherosclerosis (ICD10-I70.0). Electronically Signed   By: Helyn Numbers M.D.   On: 12/24/2022 21:02

## 2023-01-23 NOTE — Patient Instructions (Addendum)
[facility] at [facility] Discharge Instructions   You were seen and examined today by Dr. Ellin Saba.  He reviewed the results of your lab work which are mostly normal/stable. Your magnesium was low at 1.3 today. We will give you IV magnesium in the clinic today.   We will hold your treatment today due to your colitis. We will put you on high dose steroid (prednisone 60 mg). You will take this daily with food.   Take prednisone 60 mg daily for 2 weeks, then 40 mg daily for 2 weeks, then 20 mg daily until you see [place] again.   We will see you back in 5 weeks.   Return as scheduled.     Thank you for choosing [facility] at [facility] to provide your oncology and hematology care.  To afford each patient quality time with our provider, please arrive at least 15 minutes before your scheduled appointment time.   If you have a lab appointment with the Cancer Center please come in thru the Main Entrance and check in at the main information desk.  You need to re-schedule your appointment should you arrive 10 or more minutes late.  We strive to give you quality time with our providers, and arriving late affects you and other patients whose appointments are after yours.  Also, if you no show three or more times for appointments you may be dismissed from the clinic at the providers discretion.     Again, thank you for choosing [facility].  Our hope is that these requests will decrease the amount of time that you wait before being seen by our physicians.       _____________________________________________________________  Should you have questions after your visit to [facility], please contact our office at [phone] and follow the prompts.  Our office hours are 8:00 a.m. and 4:30 p.m. Monday - Friday.  Please note that voicemails left after 4:00 p.m. may not be returned until the following business day.  We are closed  weekends and major holidays.  You do have access to a nurse 24-7, just call the main number to the clinic [phone] and do not press any options, hold on the line and a nurse will answer the phone.    For prescription refill requests, have your pharmacy contact our office and allow 72 hours.    Due to Covid, you will need to wear a mask upon entering the hospital. If you do not have a mask, a mask will be given to you at the Main Entrance upon arrival. For doctor visits, patients may have 1 support person age 71 or older with them. For treatment visits, patients can not have anyone with them due to social distancing guidelines and our immunocompromised population.

## 2023-01-23 NOTE — Progress Notes (Signed)
Patient presents today for chemotherapy infusion per provider's order. Vital signs stable. We will hold treatment due to Colitis.Patient will receive 4g IV magnesium sulfate today per Dr.K's standing orders. Patient will be on high dose prednisone taper for 5 weeks. We will see her back then per Dr.Katragadda.  Discharged from clinic via wheelchair in stable condition. Alert and oriented x 3. F/U with [facility] as scheduled.

## 2023-01-24 LAB — T4: T4, Total: 7.6 ug/dL (ref 4.5–12.0)

## 2023-01-24 LAB — HEMOGLOBIN A1C
Hgb A1c MFr Bld: 5.9 % — ABNORMAL HIGH (ref 4.8–5.6)
Mean Plasma Glucose: 122.63 mg/dL

## 2023-01-25 ENCOUNTER — Other Ambulatory Visit: Payer: Self-pay

## 2023-01-27 ENCOUNTER — Other Ambulatory Visit: Payer: Self-pay

## 2023-01-30 NOTE — Therapy (Unsigned)
OUTPATIENT PHYSICAL THERAPY Wound EVALUATION   Patient Name: Denise [name] MRN: [id number] DOB:September 21, 1950, 72 y.o., female Today's Date: 01/31/2023   PCP: Mirna Mires, MD REFERRING PROVIDER: Mirna Mires, MD  END OF SESSION:  PT End of Session - 01/31/23 1211     Visit Number 1    Number of Visits 26    Date for PT Re-Evaluation 03/28/23    Authorization Type [employer]    PT Start Time 1103    PT Stop Time 1200    PT Time Calculation (min) 57 min    Equipment Utilized During Treatment Oxygen    Activity Tolerance Patient limited by pain    Behavior During Therapy [facility] for tasks assessed/performed             Past Medical History:  Diagnosis Date   Anemia    Aortic stenosis    Arthritis    Coronary artery calcification seen on CT scan    Dyspnea    Essential hypertension    GERD (gastroesophageal reflux disease)    History of lung cancer    Stage III adenocarcinoma status post chemoradiation   Port-A-Cath in place 01/06/2019   Type 2 diabetes mellitus (HCC)    Past Surgical History:  Procedure Laterality Date   BIOPSY  09/25/2022   Procedure: BIOPSY;  Surgeon: Lanelle Bal, DO;  [facility];  Service: Endoscopy;;   CHOLECYSTECTOMY  1997   COLONOSCOPY     COLONOSCOPY WITH PROPOFOL N/A 09/25/2022   Procedure: COLONOSCOPY WITH PROPOFOL;  Surgeon: Lanelle Bal, DO;  [facility];  Service: Endoscopy;  Laterality: N/A;  11:00;ASA 3   ESOPHAGEAL BRUSHING  09/25/2022   Procedure: ESOPHAGEAL BRUSHING;  Surgeon: Lanelle Bal, DO;  [facility];  Service: Endoscopy;;   ESOPHAGOGASTRODUODENOSCOPY (EGD) WITH PROPOFOL N/A 09/25/2022   Procedure: ESOPHAGOGASTRODUODENOSCOPY (EGD) WITH PROPOFOL;  Surgeon: Lanelle Bal, DO;  [facility];  Service: Endoscopy;  Laterality: N/A;  11:00AM;ASA 3   GASTRIC BYPASS     INCISIONAL HERNIA REPAIR  04/11/11   IR IMAGING GUIDED PORT INSERTION  12/27/2018   Right   IR US GUIDE BX  ASP/DRAIN  08/08/2021   LAPAROSCOPIC SALPINGOOPHERECTOMY     LAPAROTOMY  04/11/2011   Procedure: EXPLORATORY LAPAROTOMY;  Surgeon: Clovis Pu. Cornett, MD;  Location: [facility];  Service: General;  Laterality: N/A;  closure port hole   POLYPECTOMY  09/25/2022   Procedure: POLYPECTOMY;  Surgeon: Lanelle Bal, DO;  [facility];  Service: Endoscopy;;   RIGHT/LEFT HEART CATH AND CORONARY ANGIOGRAPHY N/A 12/29/2019   Procedure: RIGHT/LEFT HEART CATH AND CORONARY ANGIOGRAPHY;  Surgeon: Lennette Bihari, MD;  [facility];  Service: Cardiovascular;  Laterality: N/A;   TOTAL HIP ARTHROPLASTY  03/07/2012   Procedure: TOTAL HIP ARTHROPLASTY ANTERIOR APPROACH;  Surgeon: Shelda Pal, MD;  Location: [facility];  Service: Orthopedics;  Laterality: Right;   TOTAL SHOULDER ARTHROPLASTY Left 01/21/2015   TOTAL SHOULDER ARTHROPLASTY Left 01/21/2015   Procedure: LEFT TOTAL SHOULDER ARTHROPLASTY;  Surgeon: Francena Hanly, MD;  Location: [facility];  Service: Orthopedics;  Laterality: Left;   VAGINAL HYSTERECTOMY     Patient Active Problem List   Diagnosis Date Noted   Colitis 01/11/2023   Pneumonia of left lower lobe due to infectious organism 12/28/2022   Acute respiratory failure with hypoxia (HCC) 12/28/2022   Loculated pleural effusion 12/28/2022   Acute on chronic respiratory failure with hypoxia (HCC) 12/24/2022  Pleural effusion, left 12/24/2022   Multifocal pneumonia 12/24/2022   Atrial fibrillation with RVR (HCC) 12/24/2022   Severe sepsis (HCC) 12/24/2022   Iron deficiency anemia 09/02/2022   Early satiety 09/02/2022   Intramuscular hematoma 08/03/2021   Pulmonary embolus (HCC) 07/17/2021   Type 2 diabetes mellitus (HCC)    Essential hypertension    History of lung cancer    Class 3 obesity    Hypokalemia    Malignant neoplasm metastatic to brain (HCC) 06/04/2020   Meningioma, cerebral (HCC) 05/18/2020   Abnormal myocardial perfusion study    Aortic stenosis    DOE  (dyspnea on exertion) 10/09/2019   Meningioma (HCC) 03/20/2019   Port-A-Cath in place 01/06/2019   Adenocarcinoma of left lung (HCC) 12/09/2018   S/P shoulder replacement 01/21/2015   Expected blood loss anemia 03/08/2012   Morbid obesity with BMI of 45.0-49.9, adult (HCC) 03/08/2012   S/P right THA, AA 03/07/2012   Small bowel obstruction (HCC) 04/11/2011   Incisional hernia with obstruction 04/11/2011   Pelvic mass 03/29/2011   Radicular low back pain 11/03/2010   Hip arthritis 11/03/2010    ONSET DATE: 12/28/22  REFERRING DIAG:    G95.621H (ICD-10-CM) - Unspecified open wound, left lower leg, initial encounter  R60.0 (ICD-10-CM) - Localized edema  C34.92 (ICD-10-CM) - Malignant neoplasm of unspecified part of left bronchus or lung    THERAPY DIAG:  S81.802; open wounds of left LE, Rt LE wounds  Lt LE pain   Rationale for Evaluation and Treatment: Rehabilitation    01/31/23 0001  Subjective Assessment  Subjective Pt states that she has had swelling and weeping in her legs since 2017.  She was placed on and continues to take lasix.  Her wounds and weeping have become much more severe in the past two months.  She states that the drainage is soaking her clothes and her bed sheets.  She has been referred to skilled PT   Patient and Family Stated Goals weeping to stop, decreased pain  Date of Onset  (chronic)  Prior Treatments antibiotic, self care, lasix  Pain Assessment  Pain Scale 0-10  Pain Score 10  Pain Type Chronic pain  Pain Location Leg  Pain Orientation Right;Lower  Pain Descriptors / Indicators Aching;Sharp  Pain Onset On-going  Multiple Pain Sites Yes (Lt leg not as bad would rate that as a five.)  Evaluation and Treatment  Evaluation and Treatment Procedures Explained to Patient/Family Yes  Evaluation and Treatment Procedures agreed to  Wound / Incision (Open or Dehisced) 01/15/23 Non-pressure wound Pretibial Distal;Right  Date First Assessed/Time First  Assessed: 01/15/23 0126   Wound Type: Non-pressure wound  Location: Pretibial  Location Orientation: Distal;Right  Present on Admission: No  Wound Image     Dressing Type None  Dressing Changed New  Dressing Status None  Dressing Change Frequency PRN  Site / Wound Assessment Painful;Pale  % Wound base Red or Granulating 0%  % Wound base Yellow/Fibrinous Exudate 100%  Peri-wound Assessment Edema;Induration  Drainage Amount Copious  Drainage Description Serous  Treatment Cleansed;Other (Comment) (manual techniques and compression dressing B)  Wound Therapy - Assess/Plan/Recommendations  Wound Therapy - Clinical Statement see below  Wound Therapy - Frequency 3X / week (3x for 2 weeks or until weeping has decreased then decrease to 2x a week for the next 10 weeks)  Wound Therapy - Current Recommendations PT  Wound Plan see for debridement, manual and dressing change  Wound Therapy  Dressing  silver hydrofiber (3 )  sheets and 1 sheet calcium alginate, Ab pad and profore compression dressing to Right LE; Lt LE Calcium alginate, ab pad and compression dressing . Nettting placed on B LE          PATIENT EDUCATION: Education details: Keep bandage on unless it becomes wet or painful  Person educated: Patient Education method: Explanation Education comprehension: returned demonstration   HOME EXERCISE PROGRAM: Ankle pumps, LAQ, hip ab/adduction, marching    GOALS: Goals reviewed with patient? No  SHORT TERM GOALS: Target date: 02/28/23  Pt wound to be 100% granulated  Baseline: Goal status: INITIAL  2.  PT wounds to no longer be weeping  Baseline:  Goal status: INITIAL  3.  Pt pain to be no greater than a 5/10 Baseline:  Goal status: INITIAL   LONG TERM GOALS: Target date: 03/22/22  PT to have no pain in her  LE's Baseline:  Goal status: INITIAL  2.  PT wound to be healed  Baseline:  Goal status: INITIAL  3.  Pt to verbalize that she understands the importance  of wearing compression garment, completing her exercises  Baseline:  Goal status: INITIAL   ASSESSMENT:  CLINICAL IMPRESSION: Patient is a 72 y.o. female who was seen today for physical therapy evaluation and treatment for a non-healing wound of the B LE.  The pt has multiple wounds on B LE and moderate to severe weeping.  B  LE have induration from the thigh to the feet.  PT will benefit from skilled PT to improve the healing environment of the wound as well as decrease the pt pain.  .    OBJECTIVE IMPAIRMENTS: decreased activity tolerance, difficulty walking, increased edema, and pain.   ACTIVITY LIMITATIONS: dressing, hygiene/grooming, and locomotion level  PARTICIPATION LIMITATIONS: community activity  PERSONAL FACTORS: Time since onset of injury/illness/exacerbation are also affecting patient's functional outcome. Type 2 diabetes mellitus (HCC), Metastatic adenocarcinoma of the lung,    Essential hypertensiChronic venous stasis edema BLEs - Right greater than left   Class 3 obesity   Atrial fibrillation with RVR (HCC  REHAB POTENTIAL: Good  CLINICAL DECISION MAKING: Stable/uncomplicated  EVALUATION COMPLEXITY: Moderate  PLAN: PT FREQUENCY: 2x/week  PT DURATION: 8 weeks  PLANNED INTERVENTIONS: 97110-Therapeutic exercises, 97535- Self Care, 02725- Manual therapy, Patient/Family education, Manual lymph drainage, Compression bandaging, and wound care  to include debridement.    PLAN FOR NEXT SESSION: debridement and dressing change as indicated   Virgina Organ, PT CLT [phone]  01/31/2023, 12:28 PM

## 2023-01-31 ENCOUNTER — Ambulatory Visit (HOSPITAL_COMMUNITY): Payer: Medicare HMO | Attending: Family Medicine | Admitting: Physical Therapy

## 2023-01-31 ENCOUNTER — Inpatient Hospital Stay: Payer: Medicare HMO | Admitting: Hematology

## 2023-01-31 ENCOUNTER — Inpatient Hospital Stay: Payer: Medicare HMO

## 2023-01-31 ENCOUNTER — Other Ambulatory Visit: Payer: Self-pay

## 2023-01-31 ENCOUNTER — Encounter (HOSPITAL_COMMUNITY): Payer: Self-pay | Admitting: Physical Therapy

## 2023-01-31 DIAGNOSIS — S81801S Unspecified open wound, right lower leg, sequela: Secondary | ICD-10-CM | POA: Diagnosis not present

## 2023-01-31 DIAGNOSIS — M79661 Pain in right lower leg: Secondary | ICD-10-CM | POA: Diagnosis not present

## 2023-01-31 DIAGNOSIS — R601 Generalized edema: Secondary | ICD-10-CM | POA: Diagnosis not present

## 2023-01-31 DIAGNOSIS — S81802S Unspecified open wound, left lower leg, sequela: Secondary | ICD-10-CM | POA: Insufficient documentation

## 2023-02-02 ENCOUNTER — Encounter (HOSPITAL_COMMUNITY): Payer: Self-pay

## 2023-02-02 ENCOUNTER — Ambulatory Visit (HOSPITAL_COMMUNITY): Payer: Medicare HMO

## 2023-02-02 DIAGNOSIS — R601 Generalized edema: Secondary | ICD-10-CM | POA: Diagnosis not present

## 2023-02-02 DIAGNOSIS — S81802S Unspecified open wound, left lower leg, sequela: Secondary | ICD-10-CM | POA: Diagnosis not present

## 2023-02-02 DIAGNOSIS — M79661 Pain in right lower leg: Secondary | ICD-10-CM

## 2023-02-02 DIAGNOSIS — S81801S Unspecified open wound, right lower leg, sequela: Secondary | ICD-10-CM | POA: Diagnosis not present

## 2023-02-02 NOTE — Therapy (Addendum)
OUTPATIENT PHYSICAL THERAPY Wound TREATMENT   Patient Name: Denise [name] MRN: [id number] DOB:06-Sep-1950, 72 y.o., female Today's Date: 02/02/2023   PCP: Mirna Mires, MD REFERRING PROVIDER: Mirna Mires, MD  END OF SESSION:  PT End of Session - 02/02/23 1059     Visit Number 2    Number of Visits 26    Date for PT Re-Evaluation 03/28/23    Authorization Type [employer]    PT Start Time 0932    PT Stop Time 1040    PT Time Calculation (min) 68 min    Equipment Utilized During Treatment Oxygen    Activity Tolerance Patient limited by pain    Behavior During Therapy [facility] for tasks assessed/performed             Past Medical History:  Diagnosis Date   Anemia    Aortic stenosis    Arthritis    Coronary artery calcification seen on CT scan    Dyspnea    Essential hypertension    GERD (gastroesophageal reflux disease)    History of lung cancer    Stage III adenocarcinoma status post chemoradiation   Port-A-Cath in place 01/06/2019   Type 2 diabetes mellitus (HCC)    Past Surgical History:  Procedure Laterality Date   BIOPSY  09/25/2022   Procedure: BIOPSY;  Surgeon: Lanelle Bal, DO;  [facility];  Service: Endoscopy;;   CHOLECYSTECTOMY  1997   COLONOSCOPY     COLONOSCOPY WITH PROPOFOL N/A 09/25/2022   Procedure: COLONOSCOPY WITH PROPOFOL;  Surgeon: Lanelle Bal, DO;  [facility];  Service: Endoscopy;  Laterality: N/A;  11:00;ASA 3   ESOPHAGEAL BRUSHING  09/25/2022   Procedure: ESOPHAGEAL BRUSHING;  Surgeon: Lanelle Bal, DO;  [facility];  Service: Endoscopy;;   ESOPHAGOGASTRODUODENOSCOPY (EGD) WITH PROPOFOL N/A 09/25/2022   Procedure: ESOPHAGOGASTRODUODENOSCOPY (EGD) WITH PROPOFOL;  Surgeon: Lanelle Bal, DO;  [facility];  Service: Endoscopy;  Laterality: N/A;  11:00AM;ASA 3   GASTRIC BYPASS     INCISIONAL HERNIA REPAIR  04/11/11   IR IMAGING GUIDED PORT INSERTION  12/27/2018   Right   IR US GUIDE BX  ASP/DRAIN  08/08/2021   LAPAROSCOPIC SALPINGOOPHERECTOMY     LAPAROTOMY  04/11/2011   Procedure: EXPLORATORY LAPAROTOMY;  Surgeon: Clovis Pu. Cornett, MD;  Location: [facility];  Service: General;  Laterality: N/A;  closure port hole   POLYPECTOMY  09/25/2022   Procedure: POLYPECTOMY;  Surgeon: Lanelle Bal, DO;  [facility];  Service: Endoscopy;;   RIGHT/LEFT HEART CATH AND CORONARY ANGIOGRAPHY N/A 12/29/2019   Procedure: RIGHT/LEFT HEART CATH AND CORONARY ANGIOGRAPHY;  Surgeon: Lennette Bihari, MD;  [facility];  Service: Cardiovascular;  Laterality: N/A;   TOTAL HIP ARTHROPLASTY  03/07/2012   Procedure: TOTAL HIP ARTHROPLASTY ANTERIOR APPROACH;  Surgeon: Shelda Pal, MD;  Location: [facility];  Service: Orthopedics;  Laterality: Right;   TOTAL SHOULDER ARTHROPLASTY Left 01/21/2015   TOTAL SHOULDER ARTHROPLASTY Left 01/21/2015   Procedure: LEFT TOTAL SHOULDER ARTHROPLASTY;  Surgeon: Francena Hanly, MD;  Location: [facility];  Service: Orthopedics;  Laterality: Left;   VAGINAL HYSTERECTOMY     Patient Active Problem List   Diagnosis Date Noted   Colitis 01/11/2023   Pneumonia of left lower lobe due to infectious organism 12/28/2022   Acute respiratory failure with hypoxia (HCC) 12/28/2022   Loculated pleural effusion 12/28/2022   Acute on chronic respiratory failure with hypoxia (HCC) 12/24/2022  Pleural effusion, left 12/24/2022   Multifocal pneumonia 12/24/2022   Atrial fibrillation with RVR (HCC) 12/24/2022   Severe sepsis (HCC) 12/24/2022   Iron deficiency anemia 09/02/2022   Early satiety 09/02/2022   Intramuscular hematoma 08/03/2021   Pulmonary embolus (HCC) 07/17/2021   Type 2 diabetes mellitus (HCC)    Essential hypertension    History of lung cancer    Class 3 obesity    Hypokalemia    Malignant neoplasm metastatic to brain (HCC) 06/04/2020   Meningioma, cerebral (HCC) 05/18/2020   Abnormal myocardial perfusion study    Aortic stenosis    DOE  (dyspnea on exertion) 10/09/2019   Meningioma (HCC) 03/20/2019   Port-A-Cath in place 01/06/2019   Adenocarcinoma of left lung (HCC) 12/09/2018   S/P shoulder replacement 01/21/2015   Expected blood loss anemia 03/08/2012   Morbid obesity with BMI of 45.0-49.9, adult (HCC) 03/08/2012   S/P right THA, AA 03/07/2012   Small bowel obstruction (HCC) 04/11/2011   Incisional hernia with obstruction 04/11/2011   Pelvic mass 03/29/2011   Radicular low back pain 11/03/2010   Hip arthritis 11/03/2010    ONSET DATE: 12/28/22  REFERRING DIAG:    V78.469G (ICD-10-CM) - Unspecified open wound, left lower leg, initial encounter  R60.0 (ICD-10-CM) - Localized edema  C34.92 (ICD-10-CM) - Malignant neoplasm of unspecified part of left bronchus or lung    THERAPY DIAG:  S81.802; open wounds of left LE, Rt LE wounds  Lt LE pain   Rationale for Evaluation and Treatment: Rehabilitation  Wound Therapy - 02/02/23 0001     Subjective Pt stated she is pain free at entrance, compression is helpful.  Pt c/o SOB on 3L O2 A walking back to wound room.    Patient and Family Stated Goals weeping to stop, decreased pain    Date of Onset --   chronic   Prior Treatments antibiotic, self care, lasix    Pain Scale 0-10    Pain Score 0-No pain   increased pain to 9-10/10 during debridement   Pain Type Chronic pain    Pain Location Leg    Pain Orientation Right;Left    Pain Descriptors / Indicators Aching;Sharp;Burning    Pain Onset On-going    Patients Stated Pain Goal 0    Pain Intervention(s) Medication (See eMAR);Repositioned;Emotional support    Multiple Pain Sites Yes    Evaluation and Treatment Procedures Explained to Patient/Family Yes    Evaluation and Treatment Procedures agreed to    Wound Properties Date First Assessed: 01/15/23 Time First Assessed: 0126 Wound Type: Non-pressure wound Location: Pretibial Location Orientation: Distal;Right Present on Admission: No   Wound Image Images linked: 2     Dressing Type Compression wrap;Abdominal pads;Alginate;Silver hydrofiber    Dressing Changed Changed    Dressing Status None    Dressing Change Frequency PRN    Site / Wound Assessment Painful;Pale    % Wound base Red or Granulating 10%    % Wound base Yellow/Fibrinous Exudate 90%    Peri-wound Assessment Edema;Induration    Drainage Amount Copious   wheeping   Drainage Description Serous;Green;No odor    Treatment Cleansed;Debridement (Selective)    Selective Debridement (non-excisional) - Location Rt and Lt distal    Selective Debridement (non-excisional) - Tools Used Forceps;Scissors    Selective Debridement (non-excisional) - Tissue Removed devitalized tissue    Wound Therapy - Clinical Statement see below    Wound Therapy - Frequency 3X / week   (3x for 2 weeks or  until weeping has decreased then decrease to 2x a week for the next 10 weeks)   Wound Therapy - Current Recommendations PT    Wound Plan see for debridement, manual and dressing change    Dressing  silver hydrofiber (3 ) sheets and 1 sheet calcium alginate, Ab pad and profore compression dressing to Right LE; Lt LE Calcium alginate, ab pad and compression dressing . Nettting placed on B LE              PATIENT EDUCATION: Education details: Keep bandage on unless it becomes wet or painful  Person educated: Patient Education method: Explanation Education comprehension: returned demonstration   HOME EXERCISE PROGRAM: Ankle pumps, LAQ, hip ab/adduction, marching    GOALS: Goals reviewed with patient? No  SHORT TERM GOALS: Target date: 02/28/23  Pt wound to be 100% granulated  Baseline: Goal status: INITIAL  2.  PT wounds to no longer be weeping  Baseline:  Goal status: INITIAL  3.  Pt pain to be no greater than a 5/10 Baseline:  Goal status: INITIAL   LONG TERM GOALS: Target date: 03/22/22  PT to have no pain in her  LE's Baseline:  Goal status: INITIAL  2.  PT wound to be healed  Baseline:   Goal status: INITIAL  3.  Pt to verbalize that she understands the importance of wearing compression garment, completing her exercises  Baseline:  Goal status: INITIAL   ASSESSMENT:  CLINICAL IMPRESSION: Pt arrived with dressings intact and reports of comfort and no pain at entrance.  Pt reports increased pain with debridement, monitored through session with occasional rest breaks given.  Selective debridement for removal of devitalized tissue and dry skin perimeter to promote healing.  Silver was a little adherent to wound bed, used saline to assist with removal for pain control.  Pt continues to have significant drainage and wheeping present during session so continues with silverhydrofiber/alginate to address drainage and profore for edema control.  Measurements were taken for compression garment for Lt LE, pt encouraged to go ahead and purchase for easy transition once healed.  Given [employer] handout.   OBJECTIVE IMPAIRMENTS: decreased activity tolerance, difficulty walking, increased edema, and pain.   ACTIVITY LIMITATIONS: dressing, hygiene/grooming, and locomotion level  PARTICIPATION LIMITATIONS: community activity  PERSONAL FACTORS: Time since onset of injury/illness/exacerbation are also affecting patient's functional outcome. Type 2 diabetes mellitus (HCC), Metastatic adenocarcinoma of the lung,    Essential hypertensiChronic venous stasis edema BLEs - Right greater than left   Class 3 obesity   Atrial fibrillation with RVR (HCC  REHAB POTENTIAL: Good  CLINICAL DECISION MAKING: Stable/uncomplicated  EVALUATION COMPLEXITY: Moderate  PLAN: PT FREQUENCY: 2x/week  PT DURATION: 8 weeks  PLANNED INTERVENTIONS: 97110-Therapeutic exercises, 97535- Self Care, 47425- Manual therapy, Patient/Family education, Manual lymph drainage, Compression bandaging, and wound care  to include debridement.    PLAN FOR NEXT SESSION: debridement and dressing change as indicated   Becky Sax, LPTA/CLT; CBIS [phone]  Juel Burrow, PTA 02/02/2023, 2:42 PM  02/02/2023, 2:42 PM

## 2023-02-06 ENCOUNTER — Ambulatory Visit (HOSPITAL_COMMUNITY): Payer: Medicare HMO | Admitting: Physical Therapy

## 2023-02-06 ENCOUNTER — Other Ambulatory Visit: Payer: Self-pay

## 2023-02-06 DIAGNOSIS — M79661 Pain in right lower leg: Secondary | ICD-10-CM

## 2023-02-06 DIAGNOSIS — S81801S Unspecified open wound, right lower leg, sequela: Secondary | ICD-10-CM

## 2023-02-06 DIAGNOSIS — S81802S Unspecified open wound, left lower leg, sequela: Secondary | ICD-10-CM

## 2023-02-06 DIAGNOSIS — R601 Generalized edema: Secondary | ICD-10-CM

## 2023-02-06 NOTE — Therapy (Signed)
OUTPATIENT PHYSICAL THERAPY Wound TREATMENT   Patient Name: Denise [name] MRN: [id number] DOB:November 23, 1950, 72 y.o., female Today's Date: 02/06/2023   PCP: Mirna Mires, MD REFERRING PROVIDER: Mirna Mires, MD  END OF SESSION:  PT End of Session - 02/06/23 1622     Visit Number 3    Number of Visits 26    Date for PT Re-Evaluation 03/28/23    Authorization Type [employer]    PT Start Time 1108    PT Stop Time 1225    PT Time Calculation (min) 77 min    Equipment Utilized During Treatment Oxygen    Activity Tolerance Patient limited by pain    Behavior During Therapy [facility] for tasks assessed/performed             Past Medical History:  Diagnosis Date   Anemia    Aortic stenosis    Arthritis    Coronary artery calcification seen on CT scan    Dyspnea    Essential hypertension    GERD (gastroesophageal reflux disease)    History of lung cancer    Stage III adenocarcinoma status post chemoradiation   Port-A-Cath in place 01/06/2019   Type 2 diabetes mellitus (HCC)    Past Surgical History:  Procedure Laterality Date   BIOPSY  09/25/2022   Procedure: BIOPSY;  Surgeon: Lanelle Bal, DO;  [facility];  Service: Endoscopy;;   CHOLECYSTECTOMY  1997   COLONOSCOPY     COLONOSCOPY WITH PROPOFOL N/A 09/25/2022   Procedure: COLONOSCOPY WITH PROPOFOL;  Surgeon: Lanelle Bal, DO;  [facility];  Service: Endoscopy;  Laterality: N/A;  11:00;ASA 3   ESOPHAGEAL BRUSHING  09/25/2022   Procedure: ESOPHAGEAL BRUSHING;  Surgeon: Lanelle Bal, DO;  [facility];  Service: Endoscopy;;   ESOPHAGOGASTRODUODENOSCOPY (EGD) WITH PROPOFOL N/A 09/25/2022   Procedure: ESOPHAGOGASTRODUODENOSCOPY (EGD) WITH PROPOFOL;  Surgeon: Lanelle Bal, DO;  [facility];  Service: Endoscopy;  Laterality: N/A;  11:00AM;ASA 3   GASTRIC BYPASS     INCISIONAL HERNIA REPAIR  04/11/11   IR IMAGING GUIDED PORT INSERTION  12/27/2018   Right   IR US GUIDE BX  ASP/DRAIN  08/08/2021   LAPAROSCOPIC SALPINGOOPHERECTOMY     LAPAROTOMY  04/11/2011   Procedure: EXPLORATORY LAPAROTOMY;  Surgeon: Clovis Pu. Cornett, MD;  Location: [facility];  Service: General;  Laterality: N/A;  closure port hole   POLYPECTOMY  09/25/2022   Procedure: POLYPECTOMY;  Surgeon: Lanelle Bal, DO;  [facility];  Service: Endoscopy;;   RIGHT/LEFT HEART CATH AND CORONARY ANGIOGRAPHY N/A 12/29/2019   Procedure: RIGHT/LEFT HEART CATH AND CORONARY ANGIOGRAPHY;  Surgeon: Lennette Bihari, MD;  [facility];  Service: Cardiovascular;  Laterality: N/A;   TOTAL HIP ARTHROPLASTY  03/07/2012   Procedure: TOTAL HIP ARTHROPLASTY ANTERIOR APPROACH;  Surgeon: Shelda Pal, MD;  Location: [facility];  Service: Orthopedics;  Laterality: Right;   TOTAL SHOULDER ARTHROPLASTY Left 01/21/2015   TOTAL SHOULDER ARTHROPLASTY Left 01/21/2015   Procedure: LEFT TOTAL SHOULDER ARTHROPLASTY;  Surgeon: Francena Hanly, MD;  Location: [facility];  Service: Orthopedics;  Laterality: Left;   VAGINAL HYSTERECTOMY     Patient Active Problem List   Diagnosis Date Noted   Colitis 01/11/2023   Pneumonia of left lower lobe due to infectious organism 12/28/2022   Acute respiratory failure with hypoxia (HCC) 12/28/2022   Loculated pleural effusion 12/28/2022   Acute on chronic respiratory failure with hypoxia (HCC) 12/24/2022  Pleural effusion, left 12/24/2022   Multifocal pneumonia 12/24/2022   Atrial fibrillation with RVR (HCC) 12/24/2022   Severe sepsis (HCC) 12/24/2022   Iron deficiency anemia 09/02/2022   Early satiety 09/02/2022   Intramuscular hematoma 08/03/2021   Pulmonary embolus (HCC) 07/17/2021   Type 2 diabetes mellitus (HCC)    Essential hypertension    History of lung cancer    Class 3 obesity    Hypokalemia    Malignant neoplasm metastatic to brain (HCC) 06/04/2020   Meningioma, cerebral (HCC) 05/18/2020   Abnormal myocardial perfusion study    Aortic stenosis    DOE  (dyspnea on exertion) 10/09/2019   Meningioma (HCC) 03/20/2019   Port-A-Cath in place 01/06/2019   Adenocarcinoma of left lung (HCC) 12/09/2018   S/P shoulder replacement 01/21/2015   Expected blood loss anemia 03/08/2012   Morbid obesity with BMI of 45.0-49.9, adult (HCC) 03/08/2012   S/P right THA, AA 03/07/2012   Small bowel obstruction (HCC) 04/11/2011   Incisional hernia with obstruction 04/11/2011   Pelvic mass 03/29/2011   Radicular low back pain 11/03/2010   Hip arthritis 11/03/2010    ONSET DATE: 12/28/22  REFERRING DIAG:    S01.093A (ICD-10-CM) - Unspecified open wound, left lower leg, initial encounter  R60.0 (ICD-10-CM) - Localized edema  C34.92 (ICD-10-CM) - Malignant neoplasm of unspecified part of left bronchus or lung    THERAPY DIAG:  S81.802; open wounds of left LE, Rt LE wounds  Lt LE pain   Rationale for Evaluation and Treatment: Rehabilitation  Wound Therapy - 02/06/23 1623     Subjective pt states her legs are feeling so much better.  PT gets very short of breath with walk to wound room.    Patient and Family Stated Goals weeping to stop, decreased pain    Date of Onset --   chronic   Prior Treatments antibiotic, self care, lasix    Pain Scale 0-10    Pain Score 0-No pain    Evaluation and Treatment Procedures Explained to Patient/Family Yes    Evaluation and Treatment Procedures agreed to    Wound Properties Date First Assessed: 01/15/23 Time First Assessed: 0126 Wound Type: Non-pressure wound Location: Pretibial Location Orientation: Distal;Right Present on Admission: No   Dressing Type Compression wrap;Abdominal pads;Alginate;Silver hydrofiber    Dressing Changed Changed    Dressing Status None    Dressing Change Frequency PRN    Site / Wound Assessment Painful;Pale    % Wound base Red or Granulating 15%    % Wound base Yellow/Fibrinous Exudate 85%    Peri-wound Assessment Edema;Induration    Drainage Amount Moderate    Drainage Description  Purulent;Serosanguineous;No odor    Treatment Cleansed;Debridement (Selective)    Selective Debridement (non-excisional) - Location Rt and Lt distal    Selective Debridement (non-excisional) - Tools Used Forceps;Scissors    Selective Debridement (non-excisional) - Tissue Removed devitalized tissue    Wound Therapy - Clinical Statement see below    Factors Delaying/Impairing Wound Healing Vascular compromise    Hydrotherapy Plan Debridement;Dressing change    Wound Therapy - Frequency 3X / week   (3x for 2 weeks or until weeping has decreased then decrease to 2x a week for the next 10 weeks)   Wound Therapy - Current Recommendations PT    Wound Plan see for debridement, manual and dressing change    Dressing  silver hydrofiber (2 sheets divided between Lt and Rt LE), gauze, Abd pad and profore compression dressing to bil LE's.  Netting over              PATIENT EDUCATION: Education details: Keep bandage on unless it becomes wet or painful  Person educated: Patient Education method: Explanation Education comprehension: returned demonstration   HOME EXERCISE PROGRAM: Ankle pumps, LAQ, hip ab/adduction, marching    GOALS: Goals reviewed with patient? No  SHORT TERM GOALS: Target date: 02/28/23  Pt wound to be 100% granulated  Baseline: Goal status: INITIAL  2.  PT wounds to no longer be weeping  Baseline:  Goal status: INITIAL  3.  Pt pain to be no greater than a 5/10 Baseline:  Goal status: INITIAL   LONG TERM GOALS: Target date: 03/22/22  PT to have no pain in her  LE's Baseline:  Goal status: INITIAL  2.  PT wound to be healed  Baseline:  Goal status: INITIAL  3.  Pt to verbalize that she understands the importance of wearing compression garment, completing her exercises  Baseline:  Goal status: INITIAL   ASSESSMENT:  CLINICAL IMPRESSION: Dressings intact and reports overall pleased with reduction and improvement so far.  Less drainage today, however  some areas still with purulent drainage but no odor.  Pt tolerates debridement better, however still tender in areas.  Cleansed well following debridement and continued with silver hydrofiber to absorb drainage.  Moisturized and added Vaseline perimeter to wounds to prevent maceration and promote approximation of wound borders.  Pt has ordered her compression garments and will bring in when they come.   OBJECTIVE IMPAIRMENTS: decreased activity tolerance, difficulty walking, increased edema, and pain.   ACTIVITY LIMITATIONS: dressing, hygiene/grooming, and locomotion level  PARTICIPATION LIMITATIONS: community activity  PERSONAL FACTORS: Time since onset of injury/illness/exacerbation are also affecting patient's functional outcome. Type 2 diabetes mellitus (HCC), Metastatic adenocarcinoma of the lung,    Essential hypertensiChronic venous stasis edema BLEs - Right greater than left   Class 3 obesity   Atrial fibrillation with RVR (HCC  REHAB POTENTIAL: Good  CLINICAL DECISION MAKING: Stable/uncomplicated  EVALUATION COMPLEXITY: Moderate  PLAN: PT FREQUENCY: 2x/week  PT DURATION: 8 weeks  PLANNED INTERVENTIONS: 97110-Therapeutic exercises, 97535- Self Care, 09604- Manual therapy, Patient/Family education, Manual lymph drainage, Compression bandaging, and wound care  to include debridement.    PLAN FOR NEXT SESSION: debridement and dressing change as indicated    Lurena Nida, PTA 02/06/2023, 4:28 PM  02/06/2023, 4:28 PM

## 2023-02-07 ENCOUNTER — Inpatient Hospital Stay: Payer: Medicare HMO

## 2023-02-07 ENCOUNTER — Inpatient Hospital Stay: Payer: Medicare HMO | Admitting: Hematology

## 2023-02-08 ENCOUNTER — Other Ambulatory Visit (HOSPITAL_COMMUNITY): Payer: Self-pay | Admitting: Family Medicine

## 2023-02-08 DIAGNOSIS — Z1231 Encounter for screening mammogram for malignant neoplasm of breast: Secondary | ICD-10-CM

## 2023-02-09 ENCOUNTER — Ambulatory Visit (HOSPITAL_COMMUNITY): Payer: Medicare HMO | Admitting: Physical Therapy

## 2023-02-09 DIAGNOSIS — M79661 Pain in right lower leg: Secondary | ICD-10-CM | POA: Diagnosis not present

## 2023-02-09 DIAGNOSIS — R601 Generalized edema: Secondary | ICD-10-CM | POA: Diagnosis not present

## 2023-02-09 DIAGNOSIS — S81802S Unspecified open wound, left lower leg, sequela: Secondary | ICD-10-CM

## 2023-02-09 DIAGNOSIS — S81801S Unspecified open wound, right lower leg, sequela: Secondary | ICD-10-CM

## 2023-02-09 NOTE — Therapy (Signed)
OUTPATIENT PHYSICAL THERAPY Wound TREATMENT   Patient Name: Denise [name] MRN: [id number] DOB:03-27-1950, 72 y.o., female Today's Date: 02/09/2023   PCP: Mirna Mires, MD REFERRING PROVIDER: Mirna Mires, MD  END OF SESSION:  PT End of Session - 02/09/23 1624     Visit Number 4    Number of Visits 26    Date for PT Re-Evaluation 03/28/23    Authorization Type [employer]    PT Start Time 1100    PT Stop Time 1215    PT Time Calculation (min) 75 min    Equipment Utilized During Treatment Oxygen    Activity Tolerance Patient limited by pain    Behavior During Therapy [facility] for tasks assessed/performed             Past Medical History:  Diagnosis Date   Anemia    Aortic stenosis    Arthritis    Coronary artery calcification seen on CT scan    Dyspnea    Essential hypertension    GERD (gastroesophageal reflux disease)    History of lung cancer    Stage III adenocarcinoma status post chemoradiation   Port-A-Cath in place 01/06/2019   Type 2 diabetes mellitus (HCC)    Past Surgical History:  Procedure Laterality Date   BIOPSY  09/25/2022   Procedure: BIOPSY;  Surgeon: Lanelle Bal, DO;  [facility];  Service: Endoscopy;;   CHOLECYSTECTOMY  1997   COLONOSCOPY     COLONOSCOPY WITH PROPOFOL N/A 09/25/2022   Procedure: COLONOSCOPY WITH PROPOFOL;  Surgeon: Lanelle Bal, DO;  [facility];  Service: Endoscopy;  Laterality: N/A;  11:00;ASA 3   ESOPHAGEAL BRUSHING  09/25/2022   Procedure: ESOPHAGEAL BRUSHING;  Surgeon: Lanelle Bal, DO;  [facility];  Service: Endoscopy;;   ESOPHAGOGASTRODUODENOSCOPY (EGD) WITH PROPOFOL N/A 09/25/2022   Procedure: ESOPHAGOGASTRODUODENOSCOPY (EGD) WITH PROPOFOL;  Surgeon: Lanelle Bal, DO;  [facility];  Service: Endoscopy;  Laterality: N/A;  11:00AM;ASA 3   GASTRIC BYPASS     INCISIONAL HERNIA REPAIR  04/11/11   IR IMAGING GUIDED PORT INSERTION  12/27/2018   Right   IR US GUIDE BX  ASP/DRAIN  08/08/2021   LAPAROSCOPIC SALPINGOOPHERECTOMY     LAPAROTOMY  04/11/2011   Procedure: EXPLORATORY LAPAROTOMY;  Surgeon: Clovis Pu. Cornett, MD;  Location: [facility];  Service: General;  Laterality: N/A;  closure port hole   POLYPECTOMY  09/25/2022   Procedure: POLYPECTOMY;  Surgeon: Lanelle Bal, DO;  [facility];  Service: Endoscopy;;   RIGHT/LEFT HEART CATH AND CORONARY ANGIOGRAPHY N/A 12/29/2019   Procedure: RIGHT/LEFT HEART CATH AND CORONARY ANGIOGRAPHY;  Surgeon: Lennette Bihari, MD;  [facility];  Service: Cardiovascular;  Laterality: N/A;   TOTAL HIP ARTHROPLASTY  03/07/2012   Procedure: TOTAL HIP ARTHROPLASTY ANTERIOR APPROACH;  Surgeon: Shelda Pal, MD;  Location: [facility];  Service: Orthopedics;  Laterality: Right;   TOTAL SHOULDER ARTHROPLASTY Left 01/21/2015   TOTAL SHOULDER ARTHROPLASTY Left 01/21/2015   Procedure: LEFT TOTAL SHOULDER ARTHROPLASTY;  Surgeon: Francena Hanly, MD;  Location: [facility];  Service: Orthopedics;  Laterality: Left;   VAGINAL HYSTERECTOMY     Patient Active Problem List   Diagnosis Date Noted   Colitis 01/11/2023   Pneumonia of left lower lobe due to infectious organism 12/28/2022   Acute respiratory failure with hypoxia (HCC) 12/28/2022   Loculated pleural effusion 12/28/2022   Acute on chronic respiratory failure with hypoxia (HCC) 12/24/2022  Pleural effusion, left 12/24/2022   Multifocal pneumonia 12/24/2022   Atrial fibrillation with RVR (HCC) 12/24/2022   Severe sepsis (HCC) 12/24/2022   Iron deficiency anemia 09/02/2022   Early satiety 09/02/2022   Intramuscular hematoma 08/03/2021   Pulmonary embolus (HCC) 07/17/2021   Type 2 diabetes mellitus (HCC)    Essential hypertension    History of lung cancer    Class 3 obesity    Hypokalemia    Malignant neoplasm metastatic to brain (HCC) 06/04/2020   Meningioma, cerebral (HCC) 05/18/2020   Abnormal myocardial perfusion study    Aortic stenosis    DOE  (dyspnea on exertion) 10/09/2019   Meningioma (HCC) 03/20/2019   Port-A-Cath in place 01/06/2019   Adenocarcinoma of left lung (HCC) 12/09/2018   S/P shoulder replacement 01/21/2015   Expected blood loss anemia 03/08/2012   Morbid obesity with BMI of 45.0-49.9, adult (HCC) 03/08/2012   S/P right THA, AA 03/07/2012   Small bowel obstruction (HCC) 04/11/2011   Incisional hernia with obstruction 04/11/2011   Pelvic mass 03/29/2011   Radicular low back pain 11/03/2010   Hip arthritis 11/03/2010    ONSET DATE: 12/28/22  REFERRING DIAG:    W10.272Z (ICD-10-CM) - Unspecified open wound, left lower leg, initial encounter  R60.0 (ICD-10-CM) - Localized edema  C34.92 (ICD-10-CM) - Malignant neoplasm of unspecified part of left bronchus or lung    THERAPY DIAG:  S81.802; open wounds of left LE, Rt LE wounds  Lt LE pain   Rationale for Evaluation and Treatment: Rehabilitation  Wound Therapy - 02/09/23 0001     Subjective PT states that she is able to get in the car better and she was able to do some cooking which she is so grateful for.    Patient and Family Stated Goals wounds to heal    Date of Onset --   chronic   Prior Treatments antibiotic, self care, lasix    Pain Scale 0-10    Pain Score 7    no pain when pt has compression bandaging on   Evaluation and Treatment Procedures Explained to Patient/Family Yes    Evaluation and Treatment Procedures agreed to    Wound Properties Date First Assessed: 01/15/23 Time First Assessed: 0126 Wound Type: Non-pressure wound Location: Pretibial Location Orientation: Distal;Right Present on Admission: No   Wound Image Images linked: 2    Dressing Type Compression wrap;Abdominal pads;Alginate;Silver hydrofiber    Dressing Changed Changed    Dressing Status None    Dressing Change Frequency PRN    Site / Wound Assessment Pale;Painful    % Wound base Red or Granulating 35%    % Wound base Yellow/Fibrinous Exudate 65%    Peri-wound Assessment  Intact    Drainage Amount Minimal    Drainage Description Purulent    Treatment Cleansed;Debridement (Selective)    Selective Debridement (non-excisional) - Location Rt and Lt distal    Selective Debridement (non-excisional) - Tools Used Forceps;Scalpel;Scissors    Selective Debridement (non-excisional) - Tissue Removed slough    Wound Therapy - Clinical Statement see below    Factors Delaying/Impairing Wound Healing Vascular compromise    Hydrotherapy Plan Debridement;Dressing change    Wound Therapy - Frequency 3X / week   May cut back to two times a week   Wound Therapy - Current Recommendations PT    Wound Plan see for debridement, manual and dressing change    Dressing  xeroform to wounds followed by application of profore lite to B LE with netting.  PATIENT EDUCATION: Education details: Keep bandage on unless it becomes wet or painful  Person educated: Patient Education method: Explanation Education comprehension: returned demonstration   HOME EXERCISE PROGRAM: Ankle pumps, LAQ, hip ab/adduction, marching    GOALS: Goals reviewed with patient? No  SHORT TERM GOALS: Target date: 02/28/23  Pt wound to be 100% granulated  Baseline: Goal status: on going   2.  PT wounds to no longer be weeping  Baseline:  Goal status: IN PROGRESS  3.  Pt pain to be no greater than a 5/10 Baseline:  Goal status: IN PROGRESS   LONG TERM GOALS: Target date: 03/22/22  PT to have no pain in her  LE's Baseline:  Goal status: IN PROGRESS  2.  PT wound to be healed  Baseline:  Goal status: IN PROGRESS  3.  Pt to verbalize that she understands the importance of wearing compression garment, completing her exercises  Baseline:  Goal status: IN PROGRESS   ASSESSMENT:  CLINICAL IMPRESSION: Dressings intact and reports overall pleased with reduction and improvement so far.  Less drainage today, however some areas still with purulent drainage but no odor.  Pt  tolerates debridement better, however still tender in areas.  Cleansed well following debridement and continued with silver hydrofiber to absorb drainage.  Moisturized and added Vaseline perimeter to wounds to prevent maceration and promote approximation of wound borders.  Pt has ordered her compression garments and will bring in when they come.   OBJECTIVE IMPAIRMENTS: decreased activity tolerance, difficulty walking, increased edema, and pain.   ACTIVITY LIMITATIONS: dressing, hygiene/grooming, and locomotion level  PARTICIPATION LIMITATIONS: community activity  PERSONAL FACTORS: Time since onset of injury/illness/exacerbation are also affecting patient's functional outcome. Type 2 diabetes mellitus (HCC), Metastatic adenocarcinoma of the lung,    Essential hypertensiChronic venous stasis edema BLEs - Right greater than left   Class 3 obesity   Atrial fibrillation with RVR (HCC  REHAB POTENTIAL: Good  CLINICAL DECISION MAKING: Stable/uncomplicated  EVALUATION COMPLEXITY: Moderate  PLAN: PT FREQUENCY: 2x/week  PT DURATION: 8 weeks  PLANNED INTERVENTIONS: 97110-Therapeutic exercises, 97535- Self Care, 16109- Manual therapy, Patient/Family education, Manual lymph drainage, Compression bandaging, and wound care  to include debridement.    PLAN FOR NEXT SESSION: debridement and dressing change as indicated   Virgina Organ, PT CLT [phone]  02/09/2023, 4:31 PM

## 2023-02-12 ENCOUNTER — Ambulatory Visit (HOSPITAL_COMMUNITY): Payer: Medicare HMO | Admitting: Physical Therapy

## 2023-02-12 DIAGNOSIS — S81801S Unspecified open wound, right lower leg, sequela: Secondary | ICD-10-CM

## 2023-02-12 DIAGNOSIS — R601 Generalized edema: Secondary | ICD-10-CM | POA: Diagnosis not present

## 2023-02-12 DIAGNOSIS — S81802S Unspecified open wound, left lower leg, sequela: Secondary | ICD-10-CM | POA: Diagnosis not present

## 2023-02-12 DIAGNOSIS — M79661 Pain in right lower leg: Secondary | ICD-10-CM

## 2023-02-12 NOTE — Therapy (Addendum)
OUTPATIENT PHYSICAL THERAPY Wound TREATMENT   Patient Name: Denise [name] MRN: [id number] DOB:October 09, 1950, 72 y.o., female Today's Date: 02/12/2023   PCP: Mirna Mires, MD REFERRING PROVIDER: Mirna Mires, MD  END OF SESSION:  PT End of Session - 02/12/23 1340     Visit Number 5    Number of Visits 26    Date for PT Re-Evaluation 03/28/23    Authorization Type [employer]    PT Start Time 1105    PT Stop Time 1207    PT Time Calculation (min) 62 min    Equipment Utilized During Treatment Oxygen    Activity Tolerance Patient limited by pain    Behavior During Therapy [facility] for tasks assessed/performed             Past Medical History:  Diagnosis Date   Anemia    Aortic stenosis    Arthritis    Coronary artery calcification seen on CT scan    Dyspnea    Essential hypertension    GERD (gastroesophageal reflux disease)    History of lung cancer    Stage III adenocarcinoma status post chemoradiation   Port-A-Cath in place 01/06/2019   Type 2 diabetes mellitus (HCC)    Past Surgical History:  Procedure Laterality Date   BIOPSY  09/25/2022   Procedure: BIOPSY;  Surgeon: Lanelle Bal, DO;  [facility];  Service: Endoscopy;;   CHOLECYSTECTOMY  1997   COLONOSCOPY     COLONOSCOPY WITH PROPOFOL N/A 09/25/2022   Procedure: COLONOSCOPY WITH PROPOFOL;  Surgeon: Lanelle Bal, DO;  [facility];  Service: Endoscopy;  Laterality: N/A;  11:00;ASA 3   ESOPHAGEAL BRUSHING  09/25/2022   Procedure: ESOPHAGEAL BRUSHING;  Surgeon: Lanelle Bal, DO;  [facility];  Service: Endoscopy;;   ESOPHAGOGASTRODUODENOSCOPY (EGD) WITH PROPOFOL N/A 09/25/2022   Procedure: ESOPHAGOGASTRODUODENOSCOPY (EGD) WITH PROPOFOL;  Surgeon: Lanelle Bal, DO;  [facility];  Service: Endoscopy;  Laterality: N/A;  11:00AM;ASA 3   GASTRIC BYPASS     INCISIONAL HERNIA REPAIR  04/11/11   IR IMAGING GUIDED PORT INSERTION  12/27/2018   Right   IR US GUIDE BX  ASP/DRAIN  08/08/2021   LAPAROSCOPIC SALPINGOOPHERECTOMY     LAPAROTOMY  04/11/2011   Procedure: EXPLORATORY LAPAROTOMY;  Surgeon: Clovis Pu. Cornett, MD;  [facility]: [facility];  Service: General;  Laterality: N/A;  closure port hole   POLYPECTOMY  09/25/2022   Procedure: POLYPECTOMY;  Surgeon: Lanelle Bal, DO;  [facility];  Service: Endoscopy;;   RIGHT/LEFT HEART CATH AND CORONARY ANGIOGRAPHY N/A 12/29/2019   Procedure: RIGHT/LEFT HEART CATH AND CORONARY ANGIOGRAPHY;  Surgeon: Lennette Bihari, MD;  [facility];  Service: Cardiovascular;  Laterality: N/A;   TOTAL HIP ARTHROPLASTY  03/07/2012   Procedure: TOTAL HIP ARTHROPLASTY ANTERIOR APPROACH;  Surgeon: Shelda Pal, MD;  [facility]: [facility];  Service: Orthopedics;  Laterality: Right;   TOTAL SHOULDER ARTHROPLASTY Left 01/21/2015   TOTAL SHOULDER ARTHROPLASTY Left 01/21/2015   Procedure: LEFT TOTAL SHOULDER ARTHROPLASTY;  Surgeon: Francena Hanly, MD;  [facility];  Service: Orthopedics;  Laterality: Left;   VAGINAL HYSTERECTOMY     Patient Active Problem List   Diagnosis Date Noted   Colitis 01/11/2023   Pneumonia of left lower lobe due to infectious organism 12/28/2022   Acute respiratory failure with hypoxia (HCC) 12/28/2022   Loculated pleural effusion 12/28/2022   Acute on chronic respiratory failure with hypoxia (HCC) 12/24/2022  Pleural effusion, left 12/24/2022   Multifocal pneumonia 12/24/2022   Atrial fibrillation with RVR (HCC) 12/24/2022   Severe sepsis (HCC) 12/24/2022   Iron deficiency anemia 09/02/2022   Early satiety 09/02/2022   Intramuscular hematoma 08/03/2021   Pulmonary embolus (HCC) 07/17/2021   Type 2 diabetes mellitus (HCC)    Essential hypertension    History of lung cancer    Class 3 obesity    Hypokalemia    Malignant neoplasm metastatic to brain (HCC) 06/04/2020   Meningioma, cerebral (HCC) 05/18/2020   Abnormal myocardial perfusion study    Aortic stenosis    DOE  (dyspnea on exertion) 10/09/2019   Meningioma (HCC) 03/20/2019   Port-A-Cath in place 01/06/2019   Adenocarcinoma of left lung (HCC) 12/09/2018   S/P shoulder replacement 01/21/2015   Expected blood loss anemia 03/08/2012   Morbid obesity with BMI of 45.0-49.9, adult (HCC) 03/08/2012   S/P right THA, AA 03/07/2012   Small bowel obstruction (HCC) 04/11/2011   Incisional hernia with obstruction 04/11/2011   Pelvic mass 03/29/2011   Radicular low back pain 11/03/2010   Hip arthritis 11/03/2010    ONSET DATE: 12/28/22  REFERRING DIAG:    J19.147W (ICD-10-CM) - Unspecified open wound, left lower leg, initial encounter  R60.0 (ICD-10-CM) - Localized edema  C34.92 (ICD-10-CM) - Malignant neoplasm of unspecified part of left bronchus or lung    THERAPY DIAG:  S81.802; open wounds of left LE, Rt LE wounds  Lt LE pain   Rationale for Evaluation and Treatment: Rehabilitation  Wound Therapy - 02/12/23 1340     Subjective pt states she is doing the self massage but still has the edema, especially in her thighs and feet.  Her legs are feeling better overall, however.    Patient and Family Stated Goals wounds to heal    Date of Onset --   chronic   Prior Treatments antibiotic, self care, lasix    Pain Scale 0-10    Pain Score 0-No pain   none unless debriding   Evaluation and Treatment Procedures Explained to Patient/Family Yes    Evaluation and Treatment Procedures agreed to    Wound Properties Date First Assessed: 01/15/23 Time First Assessed: 0126 Wound Type: Non-pressure wound Location: Pretibial Location Orientation: Distal;Right Present on Admission: No   Dressing Type Compression wrap;Abdominal pads;Alginate;Silver hydrofiber    Dressing Changed Changed    Dressing Status None    Dressing Change Frequency PRN    Site / Wound Assessment Pale;Painful    % Wound base Red or Granulating 35%    % Wound base Yellow/Fibrinous Exudate 65%    Peri-wound Assessment Intact;Maceration     Drainage Amount Minimal    Drainage Description Serosanguineous    Treatment Cleansed;Debridement (Selective)    Selective Debridement (non-excisional) - Location Rt and Lt distal    Selective Debridement (non-excisional) - Tools Used Forceps;Scalpel;Scissors    Selective Debridement (non-excisional) - Tissue Removed slough    Wound Therapy - Clinical Statement see below    Factors Delaying/Impairing Wound Healing Vascular compromise    Hydrotherapy Plan Debridement;Dressing change    Wound Therapy - Frequency 3X / week   May cut back to two times a week   Wound Therapy - Current Recommendations PT    Wound Plan see for debridement, manual and dressing change    Dressing  silver and calcium alginate to wounds followed by application of profore lite to B LE with netting.  PATIENT EDUCATION: Education details: Keep bandage on unless it becomes wet or painful  Person educated: Patient Education method: Explanation Education comprehension: returned demonstration   HOME EXERCISE PROGRAM: Ankle pumps, LAQ, hip ab/adduction, marching    GOALS: Goals reviewed with patient? No  SHORT TERM GOALS: Target date: 02/28/23  Pt wound to be 100% granulated  Baseline: Goal status: on going   2.  PT wounds to no longer be weeping  Baseline:  Goal status: IN PROGRESS  3.  Pt pain to be no greater than a 5/10 Baseline:  Goal status: IN PROGRESS   LONG TERM GOALS: Target date: 03/22/22  PT to have no pain in her  LE's Baseline:  Goal status: IN PROGRESS  2.  PT wound to be healed  Baseline:  Goal status: IN PROGRESS  3.  Pt to verbalize that she understands the importance of wearing compression garment, completing her exercises  Baseline:  Goal status: IN PROGRESS   ASSESSMENT:  CLINICAL IMPRESSION: Dressings intact with pt reporting reduction in pain in bil LE's.  Pt is completing self massage, therex as instructred by lymphedema therapist and use of  compression but continues to present with edema, specifically in thighs and into feet/toes.  PT also heavily indurated in these areas.  Pt will benefit from sequential compression pump to assist with edema routing (order faxed to representative). Reviewed massage techniques to ensure correct form and able to demonstrate appropriately.  Wounds remained covered with full film of slough but easily debrided to reveal increase granulations beneath and continues to drain with serosanguineous drainage. Did change back to alginate due to maceration around the woundbeds from drainage.  Wounds on distal LT LE are nearly healed at this point.  Less tenderness with debridement today.     OBJECTIVE IMPAIRMENTS: decreased activity tolerance, difficulty walking, increased edema, and pain.   ACTIVITY LIMITATIONS: dressing, hygiene/grooming, and locomotion level  PARTICIPATION LIMITATIONS: community activity  PERSONAL FACTORS: Time since onset of injury/illness/exacerbation are also affecting patient's functional outcome. Type 2 diabetes mellitus (HCC), Metastatic adenocarcinoma of the lung,    Essential hypertensiChronic venous stasis edema BLEs - Right greater than left   Class 3 obesity   Atrial fibrillation with RVR (HCC  REHAB POTENTIAL: Good  CLINICAL DECISION MAKING: Stable/uncomplicated  EVALUATION COMPLEXITY: Moderate  PLAN: PT FREQUENCY: 2x/week  PT DURATION: 8 weeks  PLANNED INTERVENTIONS: 97110-Therapeutic exercises, 97535- Self Care, 40981- Manual therapy, Patient/Family education, Manual lymph drainage, Compression bandaging, and wound care  to include debridement.    PLAN FOR NEXT SESSION: debridement and dressing change as indicated   Lurena Nida, PTA/CLT [facility] Ph: [phone] 02/12/2023, 3:15 PM

## 2023-02-13 ENCOUNTER — Other Ambulatory Visit (HOSPITAL_COMMUNITY): Payer: Self-pay

## 2023-02-13 ENCOUNTER — Inpatient Hospital Stay: Payer: Medicare HMO

## 2023-02-13 ENCOUNTER — Other Ambulatory Visit: Payer: Medicare HMO

## 2023-02-14 ENCOUNTER — Ambulatory Visit (HOSPITAL_COMMUNITY): Payer: Medicare HMO | Admitting: Physical Therapy

## 2023-02-14 DIAGNOSIS — M79661 Pain in right lower leg: Secondary | ICD-10-CM

## 2023-02-14 DIAGNOSIS — S81802S Unspecified open wound, left lower leg, sequela: Secondary | ICD-10-CM | POA: Diagnosis not present

## 2023-02-14 DIAGNOSIS — S81801S Unspecified open wound, right lower leg, sequela: Secondary | ICD-10-CM | POA: Diagnosis not present

## 2023-02-14 DIAGNOSIS — R601 Generalized edema: Secondary | ICD-10-CM

## 2023-02-14 NOTE — Therapy (Signed)
OUTPATIENT PHYSICAL THERAPY Wound TREATMENT   Patient Name: Denise [name] MRN: [id number] DOB:05/29/1950, 72 y.o., female Today's Date: 02/14/2023   PCP: Mirna Mires, MD REFERRING PROVIDER: Mirna Mires, MD  END OF SESSION:  PT End of Session - 02/14/23 1430     Visit Number 6    Number of Visits 26    Date for PT Re-Evaluation 03/28/23    Authorization Type [employer]    PT Start Time 1105    PT Stop Time 1205    PT Time Calculation (min) 60 min    Equipment Utilized During Treatment Oxygen    Activity Tolerance Patient limited by pain    Behavior During Therapy [facility] for tasks assessed/performed             Past Medical History:  Diagnosis Date   Anemia    Aortic stenosis    Arthritis    Coronary artery calcification seen on CT scan    Dyspnea    Essential hypertension    GERD (gastroesophageal reflux disease)    History of lung cancer    Stage III adenocarcinoma status post chemoradiation   Port-A-Cath in place 01/06/2019   Type 2 diabetes mellitus (HCC)    Past Surgical History:  Procedure Laterality Date   BIOPSY  09/25/2022   Procedure: BIOPSY;  Surgeon: Lanelle Bal, DO;  [facility];  Service: Endoscopy;;   CHOLECYSTECTOMY  1997   COLONOSCOPY     COLONOSCOPY WITH PROPOFOL N/A 09/25/2022   Procedure: COLONOSCOPY WITH PROPOFOL;  Surgeon: Lanelle Bal, DO;  [facility];  Service: Endoscopy;  Laterality: N/A;  11:00;ASA 3   ESOPHAGEAL BRUSHING  09/25/2022   Procedure: ESOPHAGEAL BRUSHING;  Surgeon: Lanelle Bal, DO;  [facility];  Service: Endoscopy;;   ESOPHAGOGASTRODUODENOSCOPY (EGD) WITH PROPOFOL N/A 09/25/2022   Procedure: ESOPHAGOGASTRODUODENOSCOPY (EGD) WITH PROPOFOL;  Surgeon: Lanelle Bal, DO;  [facility];  Service: Endoscopy;  Laterality: N/A;  11:00AM;ASA 3   GASTRIC BYPASS     INCISIONAL HERNIA REPAIR  04/11/11   IR IMAGING GUIDED PORT INSERTION  12/27/2018   Right   IR US GUIDE BX  ASP/DRAIN  08/08/2021   LAPAROSCOPIC SALPINGOOPHERECTOMY     LAPAROTOMY  04/11/2011   Procedure: EXPLORATORY LAPAROTOMY;  Surgeon: Clovis Pu. Cornett, MD;  Location: [facility];  Service: General;  Laterality: N/A;  closure port hole   POLYPECTOMY  09/25/2022   Procedure: POLYPECTOMY;  Surgeon: Lanelle Bal, DO;  [facility];  Service: Endoscopy;;   RIGHT/LEFT HEART CATH AND CORONARY ANGIOGRAPHY N/A 12/29/2019   Procedure: RIGHT/LEFT HEART CATH AND CORONARY ANGIOGRAPHY;  Surgeon: Lennette Bihari, MD;  [facility];  Service: Cardiovascular;  Laterality: N/A;   TOTAL HIP ARTHROPLASTY  03/07/2012   Procedure: TOTAL HIP ARTHROPLASTY ANTERIOR APPROACH;  Surgeon: Shelda Pal, MD;  [facility]: [facility];  Service: Orthopedics;  Laterality: Right;   TOTAL SHOULDER ARTHROPLASTY Left 01/21/2015   TOTAL SHOULDER ARTHROPLASTY Left 01/21/2015   Procedure: LEFT TOTAL SHOULDER ARTHROPLASTY;  Surgeon: Francena Hanly, MD;  [facility]: [facility];  Service: Orthopedics;  Laterality: Left;   VAGINAL HYSTERECTOMY     Patient Active Problem List   Diagnosis Date Noted   Colitis 01/11/2023   Pneumonia of left lower lobe due to infectious organism 12/28/2022   Acute respiratory failure with hypoxia (HCC) 12/28/2022   Loculated pleural effusion 12/28/2022   Acute on chronic respiratory failure with hypoxia (HCC) 12/24/2022  Pleural effusion, left 12/24/2022   Multifocal pneumonia 12/24/2022   Atrial fibrillation with RVR (HCC) 12/24/2022   Severe sepsis (HCC) 12/24/2022   Iron deficiency anemia 09/02/2022   Early satiety 09/02/2022   Intramuscular hematoma 08/03/2021   Pulmonary embolus (HCC) 07/17/2021   Type 2 diabetes mellitus (HCC)    Essential hypertension    History of lung cancer    Class 3 obesity    Hypokalemia    Malignant neoplasm metastatic to brain (HCC) 06/04/2020   Meningioma, cerebral (HCC) 05/18/2020   Abnormal myocardial perfusion study    Aortic stenosis    DOE  (dyspnea on exertion) 10/09/2019   Meningioma (HCC) 03/20/2019   Port-A-Cath in place 01/06/2019   Adenocarcinoma of left lung (HCC) 12/09/2018   S/P shoulder replacement 01/21/2015   Expected blood loss anemia 03/08/2012   Morbid obesity with BMI of 45.0-49.9, adult (HCC) 03/08/2012   S/P right THA, AA 03/07/2012   Small bowel obstruction (HCC) 04/11/2011   Incisional hernia with obstruction 04/11/2011   Pelvic mass 03/29/2011   Radicular low back pain 11/03/2010   Hip arthritis 11/03/2010    ONSET DATE: 12/28/22  REFERRING DIAG:    G95.621H (ICD-10-CM) - Unspecified open wound, left lower leg, initial encounter  R60.0 (ICD-10-CM) - Localized edema  C34.92 (ICD-10-CM) - Malignant neoplasm of unspecified part of left bronchus or lung    THERAPY DIAG:  S81.802; open wounds of left LE, Rt LE wounds  Lt LE pain   Rationale for Evaluation and Treatment: Rehabilitation  Wound Therapy - 02/14/23 1430     Subjective pt states her legs are feeling better she can tell.  Has the compression socks she brings with her each visit when she's ready to transition.    Patient and Family Stated Goals wounds to heal    Date of Onset --   chronic   Prior Treatments antibiotic, self care, lasix    Pain Scale 0-10    Pain Score 0-No pain    Evaluation and Treatment Procedures Explained to Patient/Family Yes    Evaluation and Treatment Procedures agreed to    Wound Properties Date First Assessed: 01/15/23 Time First Assessed: 0126 Wound Type: Non-pressure wound Location: Pretibial Location Orientation: Distal;Right Present on Admission: No   Wound Image Images linked: 3    Dressing Type Compression wrap;Abdominal pads;Alginate;Silver hydrofiber    Dressing Changed Changed    Dressing Status None    Dressing Change Frequency PRN    Site / Wound Assessment Pale;Painful    % Wound base Red or Granulating 40%    % Wound base Yellow/Fibrinous Exudate 60%    Peri-wound Assessment Intact     Drainage Amount Minimal    Drainage Description Serosanguineous    Treatment Cleansed;Debridement (Selective)    Selective Debridement (non-excisional) - Location Rt and Lt distal    Selective Debridement (non-excisional) - Tools Used Forceps;Scalpel;Scissors    Selective Debridement (non-excisional) - Tissue Removed slough    Wound Therapy - Clinical Statement see below    Factors Delaying/Impairing Wound Healing Vascular compromise    Hydrotherapy Plan Debridement;Dressing change    Wound Therapy - Frequency 3X / week   May cut back to two times a week   Wound Therapy - Current Recommendations PT    Wound Plan see for debridement, manual and dressing change    Dressing  silver and calcium alginate to wounds followed by application of profore lite to B LE with netting.  PATIENT EDUCATION: Education details: Keep bandage on unless it becomes wet or painful  Person educated: Patient Education method: Explanation Education comprehension: returned demonstration   HOME EXERCISE PROGRAM: Ankle pumps, LAQ, hip ab/adduction, marching    GOALS: Goals reviewed with patient? No  SHORT TERM GOALS: Target date: 02/28/23  Pt wound to be 100% granulated  Baseline: Goal status: on going   2.  PT wounds to no longer be weeping  Baseline:  Goal status: IN PROGRESS  3.  Pt pain to be no greater than a 5/10 Baseline:  Goal status: IN PROGRESS   LONG TERM GOALS: Target date: 03/22/22  PT to have no pain in her  LE's Baseline:  Goal status: IN PROGRESS  2.  PT wound to be healed  Baseline:  Goal status: IN PROGRESS  3.  Pt to verbalize that she understands the importance of wearing compression garment, completing her exercises  Baseline:  Goal status: IN PROGRESS   ASSESSMENT:  CLINICAL IMPRESSION: Wounds continue to slowly approximate and increase in granulation following debridement.  Both LE's photographed today with noted improvement from photos taken at  evaluation.  Wounds are scattered and were not measured but show improvement with closure of some and smaller surface area/drainage overall.  Continued with silver and calcium alginate to help prevent maceration of perimeter and encourage approximation.  Pt is increasingly tolerant of debridement as well.    OBJECTIVE IMPAIRMENTS: decreased activity tolerance, difficulty walking, increased edema, and pain.   ACTIVITY LIMITATIONS: dressing, hygiene/grooming, and locomotion level  PARTICIPATION LIMITATIONS: community activity  PERSONAL FACTORS: Time since onset of injury/illness/exacerbation are also affecting patient's functional outcome. Type 2 diabetes mellitus (HCC), Metastatic adenocarcinoma of the lung,    Essential hypertensiChronic venous stasis edema BLEs - Right greater than left   Class 3 obesity   Atrial fibrillation with RVR (HCC  REHAB POTENTIAL: Good  CLINICAL DECISION MAKING: Stable/uncomplicated  EVALUATION COMPLEXITY: Moderate  PLAN: PT FREQUENCY: 2x/week  PT DURATION: 8 weeks  PLANNED INTERVENTIONS: 97110-Therapeutic exercises, 97535- Self Care, 62130- Manual therapy, Patient/Family education, Manual lymph drainage, Compression bandaging, and wound care  to include debridement.    PLAN FOR NEXT SESSION: debridement and dressing change as indicated   Lurena Nida, PTA/CLT [facility] Outpatient Rehabilitation [facility] Ph: [phone] 02/14/2023, 2:43 PM

## 2023-02-19 ENCOUNTER — Ambulatory Visit (HOSPITAL_COMMUNITY): Payer: Medicare HMO | Attending: Family Medicine | Admitting: Physical Therapy

## 2023-02-19 DIAGNOSIS — R601 Generalized edema: Secondary | ICD-10-CM | POA: Diagnosis not present

## 2023-02-19 DIAGNOSIS — S81801S Unspecified open wound, right lower leg, sequela: Secondary | ICD-10-CM | POA: Diagnosis not present

## 2023-02-19 DIAGNOSIS — S81802S Unspecified open wound, left lower leg, sequela: Secondary | ICD-10-CM | POA: Diagnosis not present

## 2023-02-19 DIAGNOSIS — M79661 Pain in right lower leg: Secondary | ICD-10-CM | POA: Insufficient documentation

## 2023-02-19 NOTE — Therapy (Signed)
OUTPATIENT PHYSICAL THERAPY Wound TREATMENT   Patient Name: Myoshi Giering MRN: [id number] DOB:06-30-1950, 72 y.o., female Today's Date: 02/19/2023   PCP: Mirna Mires, MD REFERRING PROVIDER: Mirna Mires, MD  END OF SESSION:  PT End of Session - 02/19/23 1147     Visit Number 7    Number of Visits 26    Date for PT Re-Evaluation 03/28/23    Authorization Type [employer]    PT Start Time 1100    PT Stop Time 1145    PT Time Calculation (min) 45 min    Equipment Utilized During Treatment Oxygen    Activity Tolerance Patient limited by pain    Behavior During Therapy [facility] for tasks assessed/performed             Past Medical History:  Diagnosis Date   Anemia    Aortic stenosis    Arthritis    Coronary artery calcification seen on CT scan    Dyspnea    Essential hypertension    GERD (gastroesophageal reflux disease)    History of lung cancer    Stage III adenocarcinoma status post chemoradiation   Port-A-Cath in place 01/06/2019   Type 2 diabetes mellitus (HCC)    Past Surgical History:  Procedure Laterality Date   BIOPSY  09/25/2022   Procedure: BIOPSY;  Surgeon: Lanelle Bal, DO;  [facility];  Service: Endoscopy;;   CHOLECYSTECTOMY  1997   COLONOSCOPY     COLONOSCOPY WITH PROPOFOL N/A 09/25/2022   Procedure: COLONOSCOPY WITH PROPOFOL;  Surgeon: Lanelle Bal, DO;  [facility];  Service: Endoscopy;  Laterality: N/A;  11:00;ASA 3   ESOPHAGEAL BRUSHING  09/25/2022   Procedure: ESOPHAGEAL BRUSHING;  Surgeon: Lanelle Bal, DO;  [facility];  Service: Endoscopy;;   ESOPHAGOGASTRODUODENOSCOPY (EGD) WITH PROPOFOL N/A 09/25/2022   Procedure: ESOPHAGOGASTRODUODENOSCOPY (EGD) WITH PROPOFOL;  Surgeon: Lanelle Bal, DO;  [facility];  Service: Endoscopy;  Laterality: N/A;  11:00AM;ASA 3   GASTRIC BYPASS     INCISIONAL HERNIA REPAIR  04/11/11   IR IMAGING GUIDED PORT INSERTION  12/27/2018   Right   IR US GUIDE BX ASP/DRAIN   08/08/2021   LAPAROSCOPIC SALPINGOOPHERECTOMY     LAPAROTOMY  04/11/2011   Procedure: EXPLORATORY LAPAROTOMY;  Surgeon: Clovis Pu. Cornett, MD;  [facility]: [facility];  Service: General;  Laterality: N/A;  closure port hole   POLYPECTOMY  09/25/2022   Procedure: POLYPECTOMY;  Surgeon: Lanelle Bal, DO;  [facility];  Service: Endoscopy;;   RIGHT/LEFT HEART CATH AND CORONARY ANGIOGRAPHY N/A 12/29/2019   Procedure: RIGHT/LEFT HEART CATH AND CORONARY ANGIOGRAPHY;  Surgeon: Lennette Bihari, MD;  [facility];  Service: Cardiovascular;  Laterality: N/A;   TOTAL HIP ARTHROPLASTY  03/07/2012   Procedure: TOTAL HIP ARTHROPLASTY ANTERIOR APPROACH;  Surgeon: Shelda Pal, MD;  [facility]: [facility];  Service: Orthopedics;  Laterality: Right;   TOTAL SHOULDER ARTHROPLASTY Left 01/21/2015   TOTAL SHOULDER ARTHROPLASTY Left 01/21/2015   Procedure: LEFT TOTAL SHOULDER ARTHROPLASTY;  Surgeon: Francena Hanly, MD;  [facility];  Service: Orthopedics;  Laterality: Left;   VAGINAL HYSTERECTOMY     Patient Active Problem List   Diagnosis Date Noted   Colitis 01/11/2023   Pneumonia of left lower lobe due to infectious organism 12/28/2022   Acute respiratory failure with hypoxia (HCC) 12/28/2022   Loculated pleural effusion 12/28/2022   Acute on chronic respiratory failure with hypoxia (HCC) 12/24/2022  Pleural effusion, left 12/24/2022   Multifocal pneumonia 12/24/2022   Atrial fibrillation with RVR (HCC) 12/24/2022   Severe sepsis (HCC) 12/24/2022   Iron deficiency anemia 09/02/2022   Early satiety 09/02/2022   Intramuscular hematoma 08/03/2021   Pulmonary embolus (HCC) 07/17/2021   Type 2 diabetes mellitus (HCC)    Essential hypertension    History of lung cancer    Class 3 obesity    Hypokalemia    Malignant neoplasm metastatic to brain (HCC) 06/04/2020   Meningioma, cerebral (HCC) 05/18/2020   Abnormal myocardial perfusion study    Aortic stenosis    DOE (dyspnea on  exertion) 10/09/2019   Meningioma (HCC) 03/20/2019   Port-A-Cath in place 01/06/2019   Adenocarcinoma of left lung (HCC) 12/09/2018   S/P shoulder replacement 01/21/2015   Expected blood loss anemia 03/08/2012   Morbid obesity with BMI of 45.0-49.9, adult (HCC) 03/08/2012   S/P right THA, AA 03/07/2012   Small bowel obstruction (HCC) 04/11/2011   Incisional hernia with obstruction 04/11/2011   Pelvic mass 03/29/2011   Radicular low back pain 11/03/2010   Hip arthritis 11/03/2010    ONSET DATE: 12/28/22  REFERRING DIAG:    Z61.096E (ICD-10-CM) - Unspecified open wound, left lower leg, initial encounter  R60.0 (ICD-10-CM) - Localized edema  C34.92 (ICD-10-CM) - Malignant neoplasm of unspecified part of left bronchus or lung    THERAPY DIAG:  S81.802; open wounds of left LE, Rt LE wounds  Lt LE pain   Rationale for Evaluation and Treatment: Rehabilitation  Wound Therapy - 02/19/23 0001     Subjective pt states her legs are feeling better she can tell.  Has the compression socks she brings with her each visit when she's ready to transition.    Patient and Family Stated Goals wounds to heal    Date of Onset --   chronic   Prior Treatments antibiotic, self care, lasix    Pain Scale 0-10    Pain Score 0-No pain    Evaluation and Treatment Procedures Explained to Patient/Family Yes    Evaluation and Treatment Procedures agreed to    Wound Properties Date First Assessed: 01/15/23 Time First Assessed: 0126 Wound Type: Non-pressure wound Location: Pretibial Location Orientation: Distal;Right Present on Admission: No   Wound Image Images linked: 3    Dressing Type Compression wrap;Abdominal pads;Alginate;Silver hydrofiber    Dressing Changed Changed    Dressing Status Old drainage    Dressing Change Frequency PRN    Site / Wound Assessment Pale;Painful    % Wound base Red or Granulating 55%    % Wound base Yellow/Fibrinous Exudate 45%    Peri-wound Assessment Intact    Wound  Length (cm) --   Lt LE now only has two small wounds on anterior distal LE; medial 1.8 x .3 and lateral 1x .5   Drainage Amount Minimal    Drainage Description Serous    Treatment Cleansed;Debridement (Selective)    Selective Debridement (non-excisional) - Location Rt and Lt distal    Selective Debridement (non-excisional) - Tools Used Forceps;Scalpel;Scissors    Selective Debridement (non-excisional) - Tissue Removed slough    Wound Therapy - Clinical Statement see below    Factors Delaying/Impairing Wound Healing Vascular compromise    Hydrotherapy Plan Debridement;Dressing change    Wound Therapy - Frequency 3X / week   May cut back to two times a week   Wound Therapy - Current Recommendations PT    Wound Plan see for debridement, manual and dressing change  Dressing  xeroform to wounds followed by profore compression dressing.              PATIENT EDUCATION: Education details: Keep bandage on unless it becomes wet or painful  Person educated: Patient Education method: Explanation Education comprehension: returned demonstration   HOME EXERCISE PROGRAM: Ankle pumps, LAQ, hip ab/adduction, marching    GOALS: Goals reviewed with patient? No  SHORT TERM GOALS: Target date: 02/28/23  Pt wound to be 100% granulated  Baseline: Goal status: on going   2.  PT wounds to no longer be weeping  Baseline:  Goal status: MET  3.  Pt pain to be no greater than a 5/10 Baseline:  Goal status: MET   LONG TERM GOALS: Target date: 03/22/22  PT to have no pain in her  LE's Baseline:  Goal status: MET  2.  PT wound to be healed  Baseline:  Goal status: IN PROGRESS  3.  Pt to verbalize that she understands the importance of wearing compression garment, completing her exercises  Baseline:  Goal status: IN PROGRESS   ASSESSMENT:  CLINICAL IMPRESSION: PT Lt LE will most likely be ready for compression garment next treatment.  PT states that her niece will help her don and  doff the garment.  Rt LE continues to have improved granulation and decreased wounds as well.  Anticipate that Rt Conley Rolls will be ready for discharge in two weeks.  Noted decreased induration with B thighs as pt continues to complete self manual techniques.    OBJECTIVE IMPAIRMENTS: decreased activity tolerance, difficulty walking, increased edema, and pain.   ACTIVITY LIMITATIONS: dressing, hygiene/grooming, and locomotion level  PARTICIPATION LIMITATIONS: community activity  PERSONAL FACTORS: Time since onset of injury/illness/exacerbation are also affecting patient's functional outcome. Type 2 diabetes mellitus (HCC), Metastatic adenocarcinoma of the lung,    Essential hypertensiChronic venous stasis edema BLEs - Right greater than left   Class 3 obesity   Atrial fibrillation with RVR (HCC  REHAB POTENTIAL: Good  CLINICAL DECISION MAKING: Stable/uncomplicated  EVALUATION COMPLEXITY: Moderate  PLAN: PT FREQUENCY: 2x/week  PT DURATION: 8 weeks  PLANNED INTERVENTIONS: 97110-Therapeutic exercises, 97535- Self Care, 37858- Manual therapy, Patient/Family education, Manual lymph drainage, Compression bandaging, and wound care  to include debridement.    PLAN FOR NEXT SESSION: Instructed pt to decrease treatment time to 45 minutes. Continue with debridement and dressing change as indicated  Virgina Organ, PT CLT [phone]  [facility] Ph: [phone] 02/19/2023, 11:55 AM

## 2023-02-20 ENCOUNTER — Other Ambulatory Visit: Payer: Self-pay | Admitting: Hematology

## 2023-02-20 ENCOUNTER — Ambulatory Visit (HOSPITAL_COMMUNITY)
Admission: RE | Admit: 2023-02-20 | Discharge: 2023-02-20 | Disposition: A | Discharge status: Home / Self Care | Payer: Medicare HMO | Source: Ambulatory Visit | Attending: Hematology | Admitting: Hematology

## 2023-02-20 DIAGNOSIS — C3492 Malignant neoplasm of unspecified part of left bronchus or lung: Secondary | ICD-10-CM | POA: Diagnosis not present

## 2023-02-20 DIAGNOSIS — J90 Pleural effusion, not elsewhere classified: Secondary | ICD-10-CM | POA: Diagnosis not present

## 2023-02-20 MED ORDER — IOHEXOL 300 MG/ML  SOLN
75.0000 mL | Freq: Once | INTRAMUSCULAR | Status: AC | PRN
  Administered 2023-02-20: 75 mL via INTRAVENOUS

## 2023-02-20 MED ORDER — HEPARIN SOD (PORK) LOCK FLUSH 100 UNIT/ML IV SOLN
INTRAVENOUS | Status: AC
  Filled 2023-02-20: qty 5

## 2023-02-22 ENCOUNTER — Ambulatory Visit (HOSPITAL_COMMUNITY): Payer: Medicare HMO | Admitting: Physical Therapy

## 2023-02-22 DIAGNOSIS — S81801S Unspecified open wound, right lower leg, sequela: Secondary | ICD-10-CM

## 2023-02-22 DIAGNOSIS — S81802S Unspecified open wound, left lower leg, sequela: Secondary | ICD-10-CM

## 2023-02-22 DIAGNOSIS — M79661 Pain in right lower leg: Secondary | ICD-10-CM

## 2023-02-22 DIAGNOSIS — R601 Generalized edema: Secondary | ICD-10-CM

## 2023-02-22 NOTE — Therapy (Signed)
OUTPATIENT PHYSICAL THERAPY Wound TREATMENT   Patient Name: Denise [name] MRN: [id number] DOB:1951/02/19, 72 y.o., female Today's Date: 02/22/2023   PCP: Mirna Mires, MD REFERRING PROVIDER: Mirna Mires, MD  END OF SESSION:  PT End of Session - 02/22/23 1104     Visit Number 8    Number of Visits 26    Date for PT Re-Evaluation 03/28/23    Authorization Type [employer]    PT Start Time 1105    PT Stop Time 1200    PT Time Calculation (min) 55 min    Equipment Utilized During Treatment Oxygen    Activity Tolerance Patient limited by pain    Behavior During Therapy [facility] for tasks assessed/performed             Past Medical History:  Diagnosis Date   Anemia    Aortic stenosis    Arthritis    Coronary artery calcification seen on CT scan    Dyspnea    Essential hypertension    GERD (gastroesophageal reflux disease)    History of lung cancer    Stage III adenocarcinoma status post chemoradiation   Port-A-Cath in place 01/06/2019   Type 2 diabetes mellitus (HCC)    Past Surgical History:  Procedure Laterality Date   BIOPSY  09/25/2022   Procedure: BIOPSY;  Surgeon: Lanelle Bal, DO;  [facility];  Service: Endoscopy;;   CHOLECYSTECTOMY  1997   COLONOSCOPY     COLONOSCOPY WITH PROPOFOL N/A 09/25/2022   Procedure: COLONOSCOPY WITH PROPOFOL;  Surgeon: Lanelle Bal, DO;  [facility];  Service: Endoscopy;  Laterality: N/A;  11:00;ASA 3   ESOPHAGEAL BRUSHING  09/25/2022   Procedure: ESOPHAGEAL BRUSHING;  Surgeon: Lanelle Bal, DO;  [facility];  Service: Endoscopy;;   ESOPHAGOGASTRODUODENOSCOPY (EGD) WITH PROPOFOL N/A 09/25/2022   Procedure: ESOPHAGOGASTRODUODENOSCOPY (EGD) WITH PROPOFOL;  Surgeon: Lanelle Bal, DO;  [facility];  Service: Endoscopy;  Laterality: N/A;  11:00AM;ASA 3   GASTRIC BYPASS     INCISIONAL HERNIA REPAIR  04/11/11   IR IMAGING GUIDED PORT INSERTION  12/27/2018   Right   IR US GUIDE BX ASP/DRAIN   08/08/2021   LAPAROSCOPIC SALPINGOOPHERECTOMY     LAPAROTOMY  04/11/2011   Procedure: EXPLORATORY LAPAROTOMY;  Surgeon: Clovis Pu. Cornett, MD;  [facility]: [facility];  Service: General;  Laterality: N/A;  closure port hole   POLYPECTOMY  09/25/2022   Procedure: POLYPECTOMY;  Surgeon: Lanelle Bal, DO;  [facility];  Service: Endoscopy;;   RIGHT/LEFT HEART CATH AND CORONARY ANGIOGRAPHY N/A 12/29/2019   Procedure: RIGHT/LEFT HEART CATH AND CORONARY ANGIOGRAPHY;  Surgeon: Lennette Bihari, MD;  [facility];  Service: Cardiovascular;  Laterality: N/A;   TOTAL HIP ARTHROPLASTY  03/07/2012   Procedure: TOTAL HIP ARTHROPLASTY ANTERIOR APPROACH;  Surgeon: Shelda Pal, MD;  Location: [facility];  Service: Orthopedics;  Laterality: Right;   TOTAL SHOULDER ARTHROPLASTY Left 01/21/2015   TOTAL SHOULDER ARTHROPLASTY Left 01/21/2015   Procedure: LEFT TOTAL SHOULDER ARTHROPLASTY;  Surgeon: Francena Hanly, MD;  [facility]: [facility];  Service: Orthopedics;  Laterality: Left;   VAGINAL HYSTERECTOMY     Patient Active Problem List   Diagnosis Date Noted   Colitis 01/11/2023   Pneumonia of left lower lobe due to infectious organism 12/28/2022   Acute respiratory failure with hypoxia (HCC) 12/28/2022   Loculated pleural effusion 12/28/2022   Acute on chronic respiratory failure with hypoxia (HCC) 12/24/2022  Pleural effusion, left 12/24/2022   Multifocal pneumonia 12/24/2022   Atrial fibrillation with RVR (HCC) 12/24/2022   Severe sepsis (HCC) 12/24/2022   Iron deficiency anemia 09/02/2022   Early satiety 09/02/2022   Intramuscular hematoma 08/03/2021   Pulmonary embolus (HCC) 07/17/2021   Type 2 diabetes mellitus (HCC)    Essential hypertension    History of lung cancer    Class 3 obesity    Hypokalemia    Malignant neoplasm metastatic to brain (HCC) 06/04/2020   Meningioma, cerebral (HCC) 05/18/2020   Abnormal myocardial perfusion study    Aortic stenosis    DOE (dyspnea on  exertion) 10/09/2019   Meningioma (HCC) 03/20/2019   Port-A-Cath in place 01/06/2019   Adenocarcinoma of left lung (HCC) 12/09/2018   S/P shoulder replacement 01/21/2015   Expected blood loss anemia 03/08/2012   Morbid obesity with BMI of 45.0-49.9, adult (HCC) 03/08/2012   S/P right THA, AA 03/07/2012   Small bowel obstruction (HCC) 04/11/2011   Incisional hernia with obstruction 04/11/2011   Pelvic mass 03/29/2011   Radicular low back pain 11/03/2010   Hip arthritis 11/03/2010    ONSET DATE: 12/28/22  REFERRING DIAG:    A54.098J (ICD-10-CM) - Unspecified open wound, left lower leg, initial encounter  R60.0 (ICD-10-CM) - Localized edema  C34.92 (ICD-10-CM) - Malignant neoplasm of unspecified part of left bronchus or lung    THERAPY DIAG:  S81.802; open wounds of left LE, Rt LE wounds  Lt LE pain   Rationale for Evaluation and Treatment: Rehabilitation  Wound Therapy - 02/22/23 1104     Subjective pt states her legs are feeling better she can tell.  Has the compression socks she brings with her each visit when she's ready to transition.    Patient and Family Stated Goals wounds to heal    Date of Onset --   chronic   Prior Treatments antibiotic, self care, lasix    Evaluation and Treatment Procedures Explained to Patient/Family Yes    Evaluation and Treatment Procedures agreed to    Wound Properties Date First Assessed: 01/15/23 Time First Assessed: 0126 Wound Type: Non-pressure wound Location: Pretibial Location Orientation: Distal;Right Present on Admission: No   Dressing Type Compression wrap;Abdominal pads    Dressing Changed Changed    Dressing Status Old drainage    Dressing Change Frequency PRN    Site / Wound Assessment Pale;Painful    Peri-wound Assessment Intact    Drainage Amount Scant    Drainage Description Serous    Treatment Cleansed;Debridement (Selective)    Selective Debridement (non-excisional) - Location Rt and Lt distal    Selective Debridement  (non-excisional) - Tools Used Forceps;Scalpel;Scissors    Selective Debridement (non-excisional) - Tissue Removed slough    Wound Therapy - Clinical Statement see below    Factors Delaying/Impairing Wound Healing Vascular compromise    Hydrotherapy Plan Debridement;Dressing change    Wound Therapy - Frequency 3X / week   May cut back to two times a week   Wound Therapy - Current Recommendations PT    Wound Plan see for debridement, manual and dressing change    Dressing  xeroform to wounds followed by profore compression dressing.              PATIENT EDUCATION: Education details: Keep bandage on unless it becomes wet or painful  Person educated: Patient Education method: Explanation Education comprehension: returned demonstration   HOME EXERCISE PROGRAM: Ankle pumps, LAQ, hip ab/adduction, marching    GOALS: Goals reviewed with patient?  No  SHORT TERM GOALS: Target date: 02/28/23  Pt wound to be 100% granulated  Baseline: Goal status: on going   2.  PT wounds to no longer be weeping  Baseline:  Goal status: MET  3.  Pt pain to be no greater than a 5/10 Baseline:  Goal status: MET   LONG TERM GOALS: Target date: 03/22/22  PT to have no pain in her  LE's Baseline:  Goal status: MET  2.  PT wound to be healed  Baseline:  Goal status: IN PROGRESS  3.  Pt to verbalize that she understands the importance of wearing compression garment, completing her exercises  Baseline:  Goal status: IN PROGRESS   ASSESSMENT:  CLINICAL IMPRESSION: LE's still not quite ready for transition to compression garment, however Lt will most likely be ready next week.  Rt may take additional week before all areas are completely healed. No debridement required for Lt but completed on Rt with much improved tolerance.  Continued with xeroform and profore for bil LE's.  Pt will have  her niece don and doff her garments daily.  Rt LE continues to have improved granulation and decreased  wounds as well.      OBJECTIVE IMPAIRMENTS: decreased activity tolerance, difficulty walking, increased edema, and pain.   ACTIVITY LIMITATIONS: dressing, hygiene/grooming, and locomotion level  PARTICIPATION LIMITATIONS: community activity  PERSONAL FACTORS: Time since onset of injury/illness/exacerbation are also affecting patient's functional outcome. Type 2 diabetes mellitus (HCC), Metastatic adenocarcinoma of the lung,    Essential hypertensiChronic venous stasis edema BLEs - Right greater than left   Class 3 obesity   Atrial fibrillation with RVR (HCC  REHAB POTENTIAL: Good  CLINICAL DECISION MAKING: Stable/uncomplicated  EVALUATION COMPLEXITY: Moderate  PLAN: PT FREQUENCY: 2x/week  PT DURATION: 8 weeks  PLANNED INTERVENTIONS: 97110-Therapeutic exercises, 97535- Self Care, [id number]- Manual therapy, Patient/Family education, Manual lymph drainage, Compression bandaging, and wound care  to include debridement.    PLAN FOR NEXT SESSION: Instructed pt to decrease treatment time to 45 minutes. Continue with debridement and dressing change as indicated    Lurena Nida, PTA/CLT [facility] Ph: [phone]   02/22/2023, 1:15 PM

## 2023-02-27 ENCOUNTER — Ambulatory Visit (HOSPITAL_COMMUNITY): Payer: Medicare HMO | Admitting: Physical Therapy

## 2023-02-27 ENCOUNTER — Encounter: Payer: Self-pay | Admitting: Student

## 2023-02-27 ENCOUNTER — Ambulatory Visit: Payer: Medicare HMO | Attending: Student | Admitting: Student

## 2023-02-27 VITALS — BP 118/72 | HR 64 | Ht 62.0 in | Wt 218.2 lb

## 2023-02-27 DIAGNOSIS — M79661 Pain in right lower leg: Secondary | ICD-10-CM | POA: Diagnosis not present

## 2023-02-27 DIAGNOSIS — S81801S Unspecified open wound, right lower leg, sequela: Secondary | ICD-10-CM

## 2023-02-27 DIAGNOSIS — I272 Pulmonary hypertension, unspecified: Secondary | ICD-10-CM

## 2023-02-27 DIAGNOSIS — R601 Generalized edema: Secondary | ICD-10-CM

## 2023-02-27 DIAGNOSIS — I951 Orthostatic hypotension: Secondary | ICD-10-CM | POA: Diagnosis not present

## 2023-02-27 DIAGNOSIS — C3492 Malignant neoplasm of unspecified part of left bronchus or lung: Secondary | ICD-10-CM | POA: Diagnosis not present

## 2023-02-27 DIAGNOSIS — I480 Paroxysmal atrial fibrillation: Secondary | ICD-10-CM

## 2023-02-27 DIAGNOSIS — I350 Nonrheumatic aortic (valve) stenosis: Secondary | ICD-10-CM

## 2023-02-27 DIAGNOSIS — I890 Lymphedema, not elsewhere classified: Secondary | ICD-10-CM

## 2023-02-27 DIAGNOSIS — S81802S Unspecified open wound, left lower leg, sequela: Secondary | ICD-10-CM | POA: Diagnosis not present

## 2023-02-27 MED ORDER — MIDODRINE HCL 5 MG PO TABS: 5.0000 mg | ORAL_TABLET | Freq: Three times a day (TID) | ORAL | 5 refills | Status: DC

## 2023-02-27 MED ORDER — CARVEDILOL 3.125 MG PO TABS: 3.1250 mg | ORAL_TABLET | Freq: Two times a day (BID) | ORAL | 3 refills | Status: DC

## 2023-02-27 MED ORDER — MIDODRINE HCL 5 MG PO TABS: 5.0000 mg | ORAL_TABLET | Freq: Two times a day (BID) | ORAL | 5 refills | Status: DC

## 2023-02-27 NOTE — Progress Notes (Signed)
Cardiology Office Note    Date:  02/27/2023  ID:  Denise [name], DOB Apr 04, 1950, MRN [id number] Cardiologist: Nona Dell, MD    History of Present Illness:    Denise [name] is a 72 y.o. female with past medical history of pulmonary hypertension with RV dysfunction, normal coronary arteries by cardiac catheterization in 2021, aortic stenosis, orthostatic hypotension, Stage IV metastatic lung cancer, chronic hypoxic respiratory failure and history of unprovoked PE who presents to the office today for 84-month follow-up.  She was last examined by Dr. Diona Browner in 10/2022 and reported NYHA class III dyspnea and was on supplemental oxygen. Did have some orthostatic symptoms and intermittent lower extremity edema. She was continued on Lasix 20 mg daily and was started on Midodrine 5 mg twice daily for orthostasis. Recent echocardiogram had shown moderate to severe AS with mean gradient 42 mmHg and she was not felt to be an optimal candidate for TAVR in light of comorbid illnesses at that time including Stage IV lung cancer.  In the interim, she was admitted to [facility] from 10/6 - 01/02/2023 for severe sepsis in the setting of multifocal pneumonia and possible parapneumonic effusion. Did undergo chest tube placement during admission which was dislodged and not replaced per CCM. She had a recurrent admission from 10/24 - 01/15/2023 for generalized abdominal pain and found to have colitis. Was treated with IV antibiotics during admission. Was documented as having paroxysmal atrial fibrillation and was already on Eliquis for anticoagulation and Coreg was increased to 6.25 mg twice daily for rate-control.  In talking with the patient today, she reports having chronic shortness of breath which is typically most notable when exerting herself. She does use supplemental oxygen and is typically on 2L [place] with activity. She sleeps in a recliner at night but unaware of any PND. No recent chest pain or palpitations.  Says she was overall asymptomatic with her episodes of atrial fibrillation during her recent admission. Says that she has remained on Coreg 3.125 mg twice daily as she did not increase this following hospital discharge. She is unsure if she is taking Midodrine as 5mg  TID or 10mg  TID. Has been followed by the [facility] for open wounds and lymphedema with her right leg currently wrapped.   Studies Reviewed:   EKG: EKG is not ordered today. EKG from 01/11/2023 is reviewed and shows baseline artifact but appears most consistent with sinus tachycardia, heart rate 124 with known RBBB and LAFB.   R/LHC: 12/2019 The left ventricular systolic function is normal. LV end diastolic pressure is normal. The left ventricular ejection fraction is 55-65% by visual estimate. There is moderate aortic valve stenosis.   Mildly elevated right heart pressures with mild pulmonary hypertension with a mean PA pressure of 33 mmHg.   Normal LV function with EF estimate approximately 60%.   Normal coronary arteries.   Moderate aortic valve stenosis with a peak to peak gradient of 20, mean gradient 18.5, and peak instantaneous gradient at 25.8 mmHg; aortic valve area 1.4 cm.  Echocardiogram: 12/2022 IMPRESSIONS     1. Limited study.   2. Left ventricular ejection fraction, by estimation, is 65 to 70%. The  left ventricle has normal function. The left ventricle has no regional  wall motion abnormalities. There is moderate concentric left ventricular  hypertrophy. There is the  interventricular septum is flattened in systole and diastole, consistent  with right ventricular pressure and volume overload.   3. Right ventricular systolic function is normal. The right ventricular  size is mildly enlarged.   4. A small pericardial effusion is present. The pericardial effusion is  circumferential.   5. The mitral valve is degenerative. Moderate mitral annular  calcification.   6. The aortic valve is tricuspid.  There is moderate calcification of the  aortic valve. Aortic valve appears significantly stenotic - suggest  complete interrogation for further assessment.   7. The inferior vena cava is normal in size with greater than 50%  respiratory variability, suggesting right atrial pressure of 3 mmHg.   Comparison(s): Prior images reviewed side by side. Difficult to compare  given limited study - suggest complete study for further assessment.   Risk Assessment/Calculations:    CHA2DS2-VASc Score = 3   This indicates a 3.2% annual risk of stroke. The patient's score is based upon: CHF History: 0 HTN History: 1 Diabetes History: 0 Stroke History: 0 Vascular Disease History: 0 Age Score: 1 Gender Score: 1   Physical Exam:   VS:  BP 118/72   Pulse 64   Ht 5\' 2"  (1.575 m)   Wt 218 lb 3.2 oz (99 kg)   SpO2 97%   BMI 39.91 kg/m    Wt Readings from Last 3 Encounters:  02/27/23 218 lb 3.2 oz (99 kg)  01/23/23 214 lb 6.4 oz (97.3 kg)  01/12/23 212 lb 15.4 oz (96.6 kg)     GEN: Well nourished, well developed female appearing in no acute distress NECK: No JVD; No carotid bruits CARDIAC: RRR, 3/6 SEM along RUSB. RESPIRATORY:  Clear to auscultation without rales, wheezing or rhonchi  ABDOMEN: Appears non-distended. No obvious abdominal masses. EXTREMITIES: No clubbing or cyanosis. Right leg wrapped. Trace edema along LLE.  Distal pedal pulses are 2+ bilaterally.   Assessment and Plan:   1. Aortic Stenosis - Most recent echocardiogram in 12/2022 moderate calcification of the aortic valve and it appeared significantly stenotic but imaging was overall not ideal for complete assessment. Prior echocardiogram in 07/2022 had shown this was severe with mean gradient 42 mmHg but overall not felt felt to be a candidate for TAVR given her metastatic lung cancer. Can consider repeat imaging at the time of her next office visit but pending her clinical course, she may not be a candidate for  intervention as discussed above.  2. Orthostatic Hypotension - She was previously on Midodrine 5 mg twice daily and says this titrated to 10 mg 3 times daily during her recent admission but she is unsure if taking this as prescribed. Given her BP at 118/72 during today's visit and no recent symptoms, I did recommend that she adjust this to 5 mg 3 times daily. She was provided with a BP log and encouraged to return this in several weeks. Pending her trend, may need to further adjust dosing.  3. Pulmonary HTN - Pulmonary pressures were severely elevated by echocardiogram in 07/2022 with RVSP at 60.2 mmHg.  Prior RHC in 12/2019 showed mildly elevated right heart pressures with mild pulmonary hypertension with mean PA pressure of 33 mmHg. Pulmonary hypertension felt to be group 3/4 in the setting of Stage IV lung cancer and history of PE. Remains on Lasix 40 mg daily and not felt to be a candidate for advanced therapies given her multiple comorbidities.  4. Paroxysmal Atrial Fibrillation - She did have episodes of atrial fibrillation during her prior admission but would spontaneously convert back to normal sinus rhythm. She is listed as taking Coreg 6.25 mg twice daily but has been taking 3.125 mg twice  daily. She is back in normal sinus rhythm by examination today and given her well-controlled heart rate and prior orthostasis, will continue her current regimen. - She has been on Eliquis 5 mg twice daily for anticoagulation given her history of a PE. Continue anticoagulation for now.  5. Stage IV Metastatic Lung Cancer - Followed by Oncology and recently treated for immunotherapy colitis in 01/2023. She does have scheduled follow-up for later this week.  6. Lymphedema - She has been followed by the Lymphedema Clinic and has her right leg wrapped at this time. Remains on Lasix 40 mg daily.  Signed, Ellsworth Lennox, PA-C

## 2023-02-27 NOTE — Patient Instructions (Addendum)
Medication Instructions:  Your physician has recommended you make the following change in your medication:   -Decrease Midodrine to 5 mg tablets three times daily  *If you need a refill on your cardiac medications before your next appointment, please call your pharmacy*   Lab Work: None If you have labs (blood work) drawn today and your tests are completely normal, you will receive your results only by: MyChart Message (if you have MyChart) OR A paper copy in the mail If you have any lab test that is abnormal or we need to change your treatment, we will call you to review the results.   Testing/Procedures: None   Follow-Up: At [facility], you and your health needs are our priority.  As part of our continuing mission to provide you with exceptional heart care, we have created designated Provider Care Teams.  These Care Teams include your primary Cardiologist (physician) and Advanced Practice Providers (APPs -  Physician Assistants and Nurse Practitioners) who all work together to provide you with the care you need, when you need it.  We recommend signing up for the patient portal called "MyChart".  Sign up information is provided on this After Visit Summary.  MyChart is used to connect with patients for Virtual Visits (Telemedicine).  Patients are able to view lab/test results, encounter notes, upcoming appointments, etc.  Non-urgent messages can be sent to your provider as well.   To learn more about what you can do with MyChart, go to [email or web address].    Your next appointment:   4-5 month(s)  Provider:   You may see Nona Dell, MD or one of the following Advanced Practice Providers on your designated Care Team:   Randall An, PA-C  Jacolyn Reedy, PA-C     Other Instructions Can use SALINE nasal spray.

## 2023-02-27 NOTE — Therapy (Signed)
OUTPATIENT PHYSICAL THERAPY Wound TREATMENT   Patient Name: Denise [name] MRN: [id number] DOB:[birth year], 72 y.o., female Today's Date: 02/27/2023   PCP: Mirna Mires, MD REFERRING PROVIDER: Mirna Mires, MD  END OF SESSION:  PT End of Session - 02/27/23 1249     Visit Number 9    Number of Visits 26    Date for PT Re-Evaluation 03/28/23    Authorization Type [employer]    PT Start Time 1018    PT Stop Time 1100    PT Time Calculation (min) 42 min    Equipment Utilized During Treatment Oxygen    Activity Tolerance Patient limited by pain    Behavior During Therapy [facility] for tasks assessed/performed             Past Medical History:  Diagnosis Date   Anemia    Aortic stenosis    Arthritis    Coronary artery calcification seen on CT scan    Dyspnea    Essential hypertension    GERD (gastroesophageal reflux disease)    History of lung cancer    Stage III adenocarcinoma status post chemoradiation   Port-A-Cath in place 01/06/2019   Type 2 diabetes mellitus (HCC)    Past Surgical History:  Procedure Laterality Date   BIOPSY  09/25/2022   Procedure: BIOPSY;  Surgeon: Lanelle Bal, DO;  Location: [facility];  Service: Endoscopy;;   CHOLECYSTECTOMY  1997   COLONOSCOPY     COLONOSCOPY WITH PROPOFOL N/A 09/25/2022   Procedure: COLONOSCOPY WITH PROPOFOL;  Surgeon: Lanelle Bal, DO;  Location: [facility];  Service: Endoscopy;  Laterality: N/A;  11:00;ASA 3   ESOPHAGEAL BRUSHING  09/25/2022   Procedure: ESOPHAGEAL BRUSHING;  Surgeon: Lanelle Bal, DO;  Location: [facility];  Service: Endoscopy;;   ESOPHAGOGASTRODUODENOSCOPY (EGD) WITH PROPOFOL N/A 09/25/2022   Procedure: ESOPHAGOGASTRODUODENOSCOPY (EGD) WITH PROPOFOL;  Surgeon: Lanelle Bal, DO;  Location: [facility];  Service: Endoscopy;  Laterality: N/A;  11:00AM;ASA 3   GASTRIC BYPASS     INCISIONAL HERNIA REPAIR  04/11/11   IR IMAGING GUIDED PORT INSERTION  12/27/2018   Right   IR US GUIDE BX  ASP/DRAIN  08/08/2021   LAPAROSCOPIC SALPINGOOPHERECTOMY     LAPAROTOMY  04/11/2011   Procedure: EXPLORATORY LAPAROTOMY;  Surgeon: Clovis Pu. Cornett, MD;  Location: [facility];  Service: General;  Laterality: N/A;  closure port hole   POLYPECTOMY  09/25/2022   Procedure: POLYPECTOMY;  Surgeon: Lanelle Bal, DO;  Location: [facility];  Service: Endoscopy;;   RIGHT/LEFT HEART CATH AND CORONARY ANGIOGRAPHY N/A 12/29/2019   Procedure: RIGHT/LEFT HEART CATH AND CORONARY ANGIOGRAPHY;  Surgeon: Lennette Bihari, MD;  Location: [facility];  Service: Cardiovascular;  Laterality: N/A;   TOTAL HIP ARTHROPLASTY  03/07/2012   Procedure: TOTAL HIP ARTHROPLASTY ANTERIOR APPROACH;  Surgeon: Shelda Pal, MD;  Location: [facility];  Service: Orthopedics;  Laterality: Right;   TOTAL SHOULDER ARTHROPLASTY Left 01/21/2015   TOTAL SHOULDER ARTHROPLASTY Left 01/21/2015   Procedure: LEFT TOTAL SHOULDER ARTHROPLASTY;  Surgeon: Francena Hanly, MD;  Location: [facility] OR;  Service: Orthopedics;  Laterality: Left;   VAGINAL HYSTERECTOMY     Patient Active Problem List   Diagnosis Date Noted   Colitis 01/11/2023   Pneumonia of left lower lobe due to infectious organism 12/28/2022   Acute respiratory failure with hypoxia (HCC) 12/28/2022   Loculated pleural effusion 12/28/2022   Acute on chronic respiratory failure with hypoxia (HCC) 12/24/2022  Pleural effusion, left 12/24/2022   Multifocal pneumonia 12/24/2022   Atrial fibrillation with RVR (HCC) 12/24/2022   Severe sepsis (HCC) 12/24/2022   Iron deficiency anemia 09/02/2022   Early satiety 09/02/2022   Intramuscular hematoma 08/03/2021   Pulmonary embolus (HCC) 07/17/2021   Type 2 diabetes mellitus (HCC)    Essential hypertension    History of lung cancer    Class 3 obesity    Hypokalemia    Malignant neoplasm metastatic to brain (HCC) 06/04/2020   Meningioma, cerebral (HCC) 05/18/2020   Abnormal myocardial perfusion study    Aortic stenosis    DOE  (dyspnea on exertion) 10/09/2019   Meningioma (HCC) 03/20/2019   Port-A-Cath in place 01/06/2019   Adenocarcinoma of left lung (HCC) 12/09/2018   S/P shoulder replacement 01/21/2015   Expected blood loss anemia 03/08/2012   Morbid obesity with BMI of 45.0-49.9, adult (HCC) 03/08/2012   S/P right THA, AA 03/07/2012   Small bowel obstruction (HCC) 04/11/2011   Incisional hernia with obstruction 04/11/2011   Pelvic mass 03/29/2011   Radicular low back pain 11/03/2010   Hip arthritis 11/03/2010    ONSET DATE: 12/28/22  REFERRING DIAG:    N82.956O (ICD-10-CM) - Unspecified open wound, left lower leg, initial encounter  R60.0 (ICD-10-CM) - Localized edema  C34.92 (ICD-10-CM) - Malignant neoplasm of unspecified part of left bronchus or lung    THERAPY DIAG:  S81.802; open wounds of left LE, Rt LE wounds  Lt LE pain   Rationale for Evaluation and Treatment: Rehabilitation  Wound Therapy - 02/27/23 1253     Subjective pt reports her legs are feeling so much better.    Patient and Family Stated Goals wounds to heal    Date of Onset --   chronic   Prior Treatments antibiotic, self care, lasix    Pain Scale 0-10    Pain Score 0-No pain    Evaluation and Treatment Procedures Explained to Patient/Family Yes    Evaluation and Treatment Procedures agreed to    Wound Properties Date First Assessed: 01/15/23 Time First Assessed: 0126 Wound Type: Non-pressure wound Location: Pretibial Location Orientation: Distal;Right Present on Admission: No   Dressing Type Compression wrap;Abdominal pads    Dressing Changed Changed    Dressing Status Old drainage    Dressing Change Frequency PRN    Site / Wound Assessment Pale;Painful    % Wound base Red or Granulating 95%   after debridement   % Wound base Yellow/Fibrinous Exudate 5%    Peri-wound Assessment Intact    Drainage Amount Minimal    Drainage Description Serosanguineous    Treatment Cleansed;Debridement (Selective)    Selective  Debridement (non-excisional) - Location Rt and Lt distal    Selective Debridement (non-excisional) - Tools Used Forceps;Scalpel;Scissors    Selective Debridement (non-excisional) - Tissue Removed slough    Wound Therapy - Clinical Statement see below    Factors Delaying/Impairing Wound Healing Vascular compromise    Hydrotherapy Plan Debridement;Dressing change    Wound Therapy - Frequency 3X / week   May cut back to two times a week   Wound Therapy - Current Recommendations PT    Wound Plan see for debridement, manual and dressing change    Dressing  Rt: xeroform to wounds followed by profore compression dressing.    Dressing Lt:  donning of knee high compression stocking               PATIENT EDUCATION: Education details: Keep bandage on unless it becomes wet  or painful  Person educated: Patient Education method: Explanation Education comprehension: returned demonstration   HOME EXERCISE PROGRAM: Ankle pumps, LAQ, hip ab/adduction, marching    GOALS: Goals reviewed with patient? No  SHORT TERM GOALS: Target date: 02/28/23  Pt wound to be 100% granulated  Baseline: Goal status: on going   2.  PT wounds to no longer be weeping  Baseline:  Goal status: MET  3.  Pt pain to be no greater than a 5/10 Baseline:  Goal status: MET   LONG TERM GOALS: Target date: 03/22/22  PT to have no pain in her  LE's Baseline:  Goal status: MET  2.  PT wound to be healed  Baseline:  Goal status: IN PROGRESS  3.  Pt to verbalize that she understands the importance of wearing compression garment, completing her exercises  Baseline:  Goal status: IN PROGRESS   ASSESSMENT:  CLINICAL IMPRESSION: Lt LE wounds are completely healed this session and transitioned to stocking.  Rt LE wounds continue to heal well with large one remaining medially and 3 smaller areas laterally.  All these are now 95% granulated following debridmeent today.  Pt is also much more tolerant of debridement  as was previously.  Continued with xeroform and profore for Rt LE.  Pt will have her niece don and doff her garments daily.  Reviewed wear/off times and when to moisturize.       OBJECTIVE IMPAIRMENTS: decreased activity tolerance, difficulty walking, increased edema, and pain.   ACTIVITY LIMITATIONS: dressing, hygiene/grooming, and locomotion level  PARTICIPATION LIMITATIONS: community activity  PERSONAL FACTORS: Time since onset of injury/illness/exacerbation are also affecting patient's functional outcome. Type 2 diabetes mellitus (HCC), Metastatic adenocarcinoma of the lung,    Essential hypertensiChronic venous stasis edema BLEs - Right greater than left   Class 3 obesity   Atrial fibrillation with RVR (HCC  REHAB POTENTIAL: Good  CLINICAL DECISION MAKING: Stable/uncomplicated  EVALUATION COMPLEXITY: Moderate  PLAN: PT FREQUENCY: 2x/week  PT DURATION: 8 weeks  PLANNED INTERVENTIONS: 97110-Therapeutic exercises, 97535- Self Care, 44034- Manual therapy, Patient/Family education, Manual lymph drainage, Compression bandaging, and wound care  to include debridement.    PLAN FOR NEXT SESSION: Continue with debridement and dressing change as indicated.  Complete weekly measurements and photographs.  Complete 10th visit PN next session.     Lurena Nida, PTA/CLT [facility] Ph: [phone]   02/27/2023, 3:31 PM

## 2023-02-28 ENCOUNTER — Other Ambulatory Visit: Payer: Self-pay

## 2023-02-28 NOTE — Progress Notes (Signed)
[facility] [address] [place] [address]    Clinic Day:  03/13/23   Referring physician: Mirna Mires, MD  Patient Care Team: Mirna Mires, MD as PCP - General (Family Medicine) Jonelle Sidle, MD as PCP - Cardiology (Cardiology) Doreatha Massed, MD as Medical Oncologist (Medical Oncology)   ASSESSMENT & PLAN:   Assessment: 1.  Adenocarcinoma of left lung (HCC) -Chemoradiation therapy with carboplatin and paclitaxel from 01/07/2019 through 02/11/2019. -Consolidation immunotherapy with durvalumab from 03/19/2019 through 04/15/2018, held due to pneumonitis. -CT chest on 07/02/2019 shows left upper lobe lung mass measuring 2.6 x 2.0 cm.  It shows improvement in size. -MRI of the brain on 07/18/2019 showed left sphenoid wing meningioma measuring 2.6 x 2.0 x 2.6 cm unchanged.  No new enhancing intracranial lesion.  Increased left temporal white matter edema. -Durvalumab restarted on 07/24/2019. -Denise [name] was evaluated by cardiology with a stress test.  EF was 46%.  Denise [name] underwent cardiac catheterization which did not show any abnormalities. - 1 year of durvalumab completed on 07/23/2020. - Liver mass biopsy (08/08/2021): Adenocarcinoma, CK7 positive, negative for TTF-1, Napsin a, GATA3, ER, CK20, CDX2 - PET scan (08/25/2021): Right upper lobe lung nodule 9 mm, SUV 5.0.  Groundglass and solid nodule periphery of the right upper lobe 8 mm, SUV 2.45.  Right lobe liver mass 4.5 x 3.6 cm, SUV 7.78. - NGS testing: PD-L1 negative, TMB-low, MSI-stable, K-ras G12 D.  No other targetable mutations. - Carboplatin, pemetrexed and pembrolizumab 4 cycles from 09/22/2021 through 11/25/2021, followed by maintenance pemetrexed and pembrolizumab -  CT CAP (11/10/2021): Reduced size of liver mass and interval resolution of previously seen groundglass pulmonary nodules.  There is a new inflammatory right upper lobe nodule.    Plan: 1.  Metastatic adenocarcinoma of the lung to the liver and  right lung: - Last pemetrexed and pembrolizumab on 11/30/2022. [facility] admission in October with colitis. - We gave her prednisone which Denise [name] completed on 02/27/2023.  Denise [name] reports improvement in her diarrhea. - CT chest (02/20/2023): Decreased areas of perihilar parenchymal groundglass opacities and reticular changes compared to CT from October 2024.  Left effusion also improved slightly.  No new masses or lymph nodes seen.  Persistent right hepatic lobe mass lesion. - Reviewed labs today: LFTs are normal.  CBC grossly normal. - Denise [name] will proceed with pemetrexed every 3 weeks.  Will hold Keytruda at this time.  RTC 6 weeks for follow-up.    2.  Meningioma: - MRI of the brain on 10/02/2022: Stable left sphenoid wing meningioma.  3.  Unprovoked pulmonary embolism: - Continue Eliquis 5 mg daily indefinitely.  No bleeding issues.   4.  Leg swellings: - Continue Lasix 20 mg daily as needed.   5.  Normocytic anemia: - Colonoscopy in July 2024 with nonbleeding internal hemorrhoids, diverticulosis and 2 mm polyp in the transverse colon. - Hemoglobin today has improved to 10.5.  Will closely monitor.  6.  Hypomagnesemia: - Continue magnesium twice daily.  Magnesium is normal at 1.7 today.    Orders Placed This Encounter  Procedures   Magnesium    Standing Status:   Future    Expected Date:   06/18/2023    Expiration Date:   06/17/2024   CBC with Differential    Standing Status:   Future    Expected Date:   06/18/2023    Expiration Date:   06/17/2024   Comprehensive metabolic panel    Standing Status:   Future  Expected Date:   06/18/2023    Expiration Date:   06/17/2024      Alben Deeds Teague,acting as a scribe for Doreatha Massed, MD.,have documented all relevant documentation on the behalf of Doreatha Massed, MD,as directed by  Doreatha Massed, MD while in the presence of Doreatha Massed, MD.  I, Doreatha Massed MD, have reviewed the above documentation for  accuracy and completeness, and I agree with the above.       Doreatha Massed, MD   12/24/202412:57 PM  CHIEF COMPLAINT:   Diagnosis: stage IV adenocarcinoma of the left lung, negative for targetable mutations    Cancer Staging  Adenocarcinoma of left lung [facility]) Staging form: Lung, AJCC 8th Edition - Clinical stage from 01/02/2019: Stage IIIB (cT3, cN2, cM0) - Signed by Doreatha Massed, MD on 01/02/2019 - Pathologic stage from 09/07/2021: Stage IVA (pTX, pNX, pM1b) - Unsigned    Prior Therapy: 1. Chemoradiation with carboplatin and paclitaxel from 01/07/2019 to 02/11/2019. 2. Consolidation with durvalumab from 03/19/2019 to 04/16/2019, held due to pneumonitis.  Current Therapy:  Pemetrexed and pembrolizumab maintenance    HISTORY OF PRESENT ILLNESS:   Oncology History  Adenocarcinoma of left lung (HCC)  12/09/2018 Initial Diagnosis   Adenocarcinoma of left lung (HCC)   01/02/2019 Cancer Staging   Staging form: Lung, AJCC 8th Edition - Clinical stage from 01/02/2019: Stage IIIB (cT3, cN2, cM0) - Signed by Doreatha Massed, MD on 01/02/2019   01/07/2019 - 02/11/2019 Chemotherapy   The patient had palonosetron (ALOXI) injection 0.25 mg, 0.25 mg, Intravenous,  Once, 6 of 6 cycles Administration: 0.25 mg (01/07/2019), 0.25 mg (01/14/2019), 0.25 mg (01/21/2019), 0.25 mg (01/28/2019), 0.25 mg (02/04/2019), 0.25 mg (02/11/2019) CARBOplatin (PARAPLATIN) 270 mg in sodium chloride 0.9 % 250 mL chemo infusion, 270 mg (100 % of original dose 266.4 mg), Intravenous,  Once, 6 of 6 cycles Dose modification:   (original dose 266.4 mg, Cycle 1),   (original dose 266.4 mg, Cycle 2),   (original dose 266.4 mg, Cycle 3),   (original dose 266.4 mg, Cycle 4) Administration: 270 mg (01/07/2019), 270 mg (01/14/2019), 270 mg (01/21/2019), 270 mg (01/28/2019), 270 mg (02/04/2019), 270 mg (02/11/2019) PACLitaxel (TAXOL) 108 mg in sodium chloride 0.9 % 250 mL chemo infusion (</= 80mg /m2), 45  mg/m2 = 108 mg, Intravenous,  Once, 6 of 6 cycles Administration: 108 mg (01/07/2019), 108 mg (01/14/2019), 108 mg (01/21/2019), 108 mg (01/28/2019), 108 mg (02/04/2019), 108 mg (02/11/2019) fosaprepitant (EMEND) 150 mg, dexamethasone (DECADRON) 12 mg in sodium chloride 0.9 % 145 mL IVPB, , Intravenous,  Once, 5 of 5 cycles Administration:  (01/14/2019),  (01/21/2019),  (01/28/2019),  (02/04/2019),  (02/11/2019)  for chemotherapy treatment.    03/19/2019 - 07/23/2020 Chemotherapy   Patient is on Treatment Plan : LUNG DURVALUMAB Q14D     09/22/2021 - 11/04/2021 Chemotherapy   Patient is on Treatment Plan : LUNG Carboplatin (5) + Pemetrexed (500) + Pembrolizumab (200) D1 q21d Induction x 4 cycles / Maintenance Pemetrexed (500) + Pembrolizumab (200) D1 q21d     09/22/2021 -  Chemotherapy   Patient is on Treatment Plan : LUNG Carboplatin (5) + Pemetrexed (500) + Pembrolizumab (200) D1 q21d Induction x 4 cycles / Maintenance Pemetrexed (500) + Pembrolizumab (200) D1 q21d        INTERVAL HISTORY:   Denise [name] is a 72 y.o. female presenting to clinic today for follow up of stage IV adenocarcinoma of the left lung. Denise [name] was last seen by me on 01/23/23.  Since her last visit,  Denise [name] underwent CT chest on 02/20/23 that found: decreasing areas of perihilar parenchymal ground-glass opacities and reticular changes compared to the CT scan of October 2024; decreasing left effusion slightly with improved components of air from the prior; stable areas of lung nodularity and left lung consolidation; no developing new mass lesion, lymph node enlargement; stable cardiac enlargement with a small pericardial effusion; and persistent right hepatic lobe mass lesion at the edge of the imaging field.   Today, Denise [name] states that Denise [name] is doing well overall. Her appetite level is at 100%. Her energy level is at 0%. Her diarrhea has resolved since her last visit. Denise [name] finished her prednisone on 02/27/23 and Denise [name] tapered as prescribed. Denise [name] is no  taking any oral magnesium, though Denise [name] had IV magnesium at her last visit. Denise [name] is taking a lower dose of Lasix at 20 mg once daily for the past 3-4 weeks. Denise [name] is taking Eliquis and potassium as prescribed. Denise [name] takes folic acid 1 mg OD and Vitamin B12 25 mg OD. Denise [name] has been having protein drinks recently.  PAST MEDICAL HISTORY:   Past Medical History: Past Medical History:  Diagnosis Date   Anemia    Aortic stenosis    Arthritis    Coronary artery calcification seen on CT scan    Dyspnea    Essential hypertension    GERD (gastroesophageal reflux disease)    History of lung cancer    Stage III adenocarcinoma status post chemoradiation   Port-A-Cath in place 01/06/2019   Type 2 diabetes mellitus [facility])     Surgical History: Past Surgical History:  Procedure Laterality Date   BIOPSY  09/25/2022   Procedure: BIOPSY;  Surgeon: Lanelle Bal, DO;  [facility]: [facility];  Service: Endoscopy;;   CHOLECYSTECTOMY  1997   COLONOSCOPY     COLONOSCOPY WITH PROPOFOL N/A 09/25/2022   Procedure: COLONOSCOPY WITH PROPOFOL;  Surgeon: Lanelle Bal, DO;  Location: [facility];  Service: Endoscopy;  Laterality: N/A;  11:00;ASA 3   ESOPHAGEAL BRUSHING  09/25/2022   Procedure: ESOPHAGEAL BRUSHING;  Surgeon: Lanelle Bal, DO;  Location: [facility];  Service: Endoscopy;;   ESOPHAGOGASTRODUODENOSCOPY (EGD) WITH PROPOFOL N/A 09/25/2022   Procedure: ESOPHAGOGASTRODUODENOSCOPY (EGD) WITH PROPOFOL;  Surgeon: Lanelle Bal, DO;  Location: [facility];  Service: Endoscopy;  Laterality: N/A;  11:00AM;ASA 3   GASTRIC BYPASS     INCISIONAL HERNIA REPAIR  04/11/11   IR IMAGING GUIDED PORT INSERTION  12/27/2018   Right   IR US GUIDE BX ASP/DRAIN  08/08/2021   LAPAROSCOPIC SALPINGOOPHERECTOMY     LAPAROTOMY  04/11/2011   Procedure: EXPLORATORY LAPAROTOMY;  Surgeon: Clovis Pu. Cornett, MD;  Location: [facility];  Service: General;  Laterality: N/A;  closure port hole   POLYPECTOMY  09/25/2022   Procedure:  POLYPECTOMY;  Surgeon: Lanelle Bal, DO;  Location: [facility];  Service: Endoscopy;;   RIGHT/LEFT HEART CATH AND CORONARY ANGIOGRAPHY N/A 12/29/2019   Procedure: RIGHT/LEFT HEART CATH AND CORONARY ANGIOGRAPHY;  Surgeon: Lennette Bihari, MD;  Location: [facility];  Service: Cardiovascular;  Laterality: N/A;   TOTAL HIP ARTHROPLASTY  03/07/2012   Procedure: TOTAL HIP ARTHROPLASTY ANTERIOR APPROACH;  Surgeon: Shelda Pal, MD;  Location: [facility];  Service: Orthopedics;  Laterality: Right;   TOTAL SHOULDER ARTHROPLASTY Left 01/21/2015   TOTAL SHOULDER ARTHROPLASTY Left 01/21/2015   Procedure: LEFT TOTAL SHOULDER ARTHROPLASTY;  Surgeon: Francena Hanly, MD;  Location: [facility] OR;  Service: Orthopedics;  Laterality:  Left;   VAGINAL HYSTERECTOMY      Social History: Social History   Socioeconomic History   Marital status: Single    Spouse name: Not on file   Number of children: Not on file   Years of education: 12th grade   Highest education level: Not on file  Occupational History   Occupation: Employed    Employer: [employer]  Tobacco Use   Smoking status: Former    Current packs/day: 0.00    Average packs/day: 1 pack/day for 12.0 years (12.0 ttl pk-yrs)    Types: Cigarettes    Start date: 03/20/1964    Quit date: 03/20/1976    Years since quitting: 47.0    Passive exposure: Past   Smokeless tobacco: Never  Vaping Use   Vaping status: Never Used  Substance and Sexual Activity   Alcohol use: No   Drug use: No   Sexual activity: Not Currently  Other Topics Concern   Not on file  Social History Narrative   Not on file   Social Drivers of Health   Financial Resource Strain: Low Risk  (12/06/2018)   Overall Financial Resource Strain (CARDIA)    Difficulty of Paying Living Expenses: Not very hard  Food Insecurity: No Food Insecurity (01/11/2023)   Hunger Vital Sign    Worried About Running Out of Food in the Last Year: Never true    Ran Out of Food in the  Last Year: Never true  Transportation Needs: No Transportation Needs (01/11/2023)   PRAPARE - Administrator, Civil Service (Medical): No    Lack of Transportation (Non-Medical): No  Physical Activity: Inactive (12/06/2018)   Exercise Vital Sign    Days of Exercise per Week: 0 days    Minutes of Exercise per Session: 0 min  Stress: No Stress Concern Present (12/09/2018)   Received from [facility] Occupational Health - Occupational Stress Questionnaire    Feeling of Stress : Not at all  Social Connections: Moderately Isolated (12/06/2018)   Social Connection and Isolation Panel [NHANES]    Frequency of Communication with Friends and Family: More than three times a week    Frequency of Social Gatherings with Friends and Family: Once a week    Attends Religious Services: More than 4 times per year    Active Member of [employer] or Organizations: No    Attends Banker Meetings: Never    Marital Status: Never married  Intimate Partner Violence: Not At Risk (01/11/2023)   Humiliation, Afraid, Rape, and Kick questionnaire    Fear of Current or Ex-Partner: No    Emotionally Abused: No    Physically Abused: No    Sexually Abused: No    Family History: Family History  Problem Relation Age of Onset   Breast cancer Mother    COPD Mother    Arthritis Mother    Diabetes Mother    Hypertension Mother    Hypertension Father    Diabetes Father    Breast cancer Sister    Thyroid cancer Brother    Heart attack Brother    Huntington's disease Maternal Grandmother     Current Medications:  Current Outpatient Medications:    acetaminophen (TYLENOL) 500 MG tablet, Take 500 mg by mouth every 6 (six) hours as needed for moderate pain., Disp: , Rfl:    albuterol (VENTOLIN HFA) 108 (90 Base) MCG/ACT inhaler, Inhale 2 puffs into the lungs every 6 (  six) hours as needed for wheezing or shortness of breath., Disp: 6.7 g, Rfl: 0   Calcium  Carbonate Antacid (TUMS PO), Take 4 tablets by mouth daily., Disp: , Rfl:    carvedilol (COREG) 3.125 MG tablet, Take 1 tablet (3.125 mg total) by mouth 2 (two) times daily., Disp: 180 tablet, Rfl: 3   Cyanocobalamin (VITAMIN B-12) 2500 MCG SUBL, Place 2,500 mcg under the tongue every morning. , Disp: , Rfl:    ELIQUIS 5 MG TABS tablet, TAKE 1 TABLET BY MOUTH TWICE A DAY, Disp: 60 tablet, Rfl: 6   ferrous sulfate 325 (65 FE) MG tablet, Take 325 mg by mouth every evening., Disp: , Rfl:    folic acid (FOLVITE) 1 MG tablet, TAKE 1 TABLET BY MOUTH EVERY DAY, Disp: 90 tablet, Rfl: 1   furosemide (LASIX) 40 MG tablet, Take 1 tablet (40 mg total) by mouth daily. (Patient taking differently: Take 20 mg by mouth daily.), Disp: 30 tablet, Rfl: 0   gabapentin (NEURONTIN) 300 MG capsule, Take 300 mg by mouth daily., Disp: , Rfl:    gabapentin (NEURONTIN) 600 MG tablet, Take 600 mg by mouth at bedtime., Disp: , Rfl:    Glycerin-Hypromellose-PEG 400 (DRY EYE RELIEF DROPS OP), Apply 1 drop to eye daily as needed (dry eyes)., Disp: , Rfl:    midodrine (PROAMATINE) 5 MG tablet, Take 1 tablet (5 mg total) by mouth 3 (three) times daily with meals., Disp: 90 tablet, Rfl: 5   Multiple Vitamin (MULITIVITAMIN WITH MINERALS) TABS, Take 1 tablet by mouth daily., Disp: , Rfl:    omeprazole (PRILOSEC) 20 MG capsule, Take 20 mg by mouth daily., Disp: , Rfl:    Potassium Chloride ER 20 MEQ TBCR, Take 2 tablets by mouth daily., Disp: , Rfl:  No current facility-administered medications for this visit.  Facility-Administered Medications Ordered in Other Visits:    magnesium sulfate 2 GM/50ML IVPB, , , ,    Allergies: No Known Allergies  REVIEW OF SYSTEMS:   Review of Systems  Constitutional:  Negative for chills, fatigue and fever.  HENT:   Negative for lump/mass, mouth sores, nosebleeds, sore throat and trouble swallowing.   Eyes:  Negative for eye problems.  Respiratory:  Positive for shortness of breath. Negative  for cough.   Cardiovascular:  Negative for chest pain, leg swelling and palpitations.  Gastrointestinal:  Negative for abdominal pain, constipation, diarrhea, nausea and vomiting.  Genitourinary:  Negative for bladder incontinence, difficulty urinating, dysuria, frequency, hematuria and nocturia.   Musculoskeletal:  Negative for arthralgias, back pain, flank pain, myalgias and neck pain.  Skin:  Negative for itching and rash.  Neurological:  Positive for dizziness (occasional). Negative for headaches and numbness.       +tingling in hands and feet  Hematological:  Does not bruise/bleed easily.  Psychiatric/Behavioral:  Negative for depression, sleep disturbance and suicidal ideas. The patient is not nervous/anxious.   All other systems reviewed and are negative.    VITALS:   Blood pressure 121/71, pulse 74, temperature 97.8 F (36.6 C), temperature source Tympanic, resp. rate 20, weight 218 lb 0.6 oz (98.9 kg), SpO2 100%.  Wt Readings from Last 3 Encounters:  03/01/23 218 lb 0.6 oz (98.9 kg)  02/27/23 218 lb 3.2 oz (99 kg)  01/23/23 214 lb 6.4 oz (97.3 kg)    Body mass index is 39.88 kg/m.  Performance status (ECOG): 1 - Symptomatic but completely ambulatory  PHYSICAL EXAM:   Physical Exam Vitals and nursing note reviewed. Exam  conducted with a chaperone present.  Constitutional:      Appearance: Normal appearance.  Cardiovascular:     Rate and Rhythm: Normal rate and regular rhythm.     Pulses: Normal pulses.     Heart sounds: Normal heart sounds.  Pulmonary:     Effort: Pulmonary effort is normal.     Breath sounds: Normal breath sounds.  Abdominal:     Palpations: Abdomen is soft. There is no hepatomegaly, splenomegaly or mass.     Tenderness: There is no abdominal tenderness.  Musculoskeletal:     Right lower leg: No edema.     Left lower leg: No edema.  Lymphadenopathy:     Cervical: No cervical adenopathy.     Right cervical: No superficial, deep or posterior  cervical adenopathy.    Left cervical: No superficial, deep or posterior cervical adenopathy.     Upper Body:     Right upper body: No supraclavicular or axillary adenopathy.     Left upper body: No supraclavicular or axillary adenopathy.  Neurological:     General: No focal deficit present.     Mental Status: Denise [name] is alert and oriented to person, place, and time.  Psychiatric:        Mood and Affect: Mood normal.        Behavior: Behavior normal.     LABS:      Latest Ref Rng & Units 03/01/2023    8:43 AM 01/23/2023   12:12 PM 01/12/2023    4:04 AM  CBC  WBC 4.0 - 10.5 K/uL 4.5  7.7  6.6   Hemoglobin 12.0 - 15.0 g/dL 74.2  8.5  8.8   Hematocrit 36.0 - 46.0 % 34.8  28.6  30.5   Platelets 150 - 400 K/uL 213  516  461       Latest Ref Rng & Units 03/01/2023    8:43 AM 01/23/2023   12:12 PM 01/14/2023    4:07 AM  CMP  Glucose 70 - 99 mg/dL 595  638  92   BUN 8 - 23 mg/dL 11  12  10    Creatinine 0.44 - 1.00 mg/dL 7.56  4.33  2.95   Sodium 135 - 145 mmol/L 145  137  137   Potassium 3.5 - 5.1 mmol/L 3.4  3.6  3.5   Chloride 98 - 111 mmol/L 109  105  103   CO2 22 - 32 mmol/L 27  23  24    Calcium 8.9 - 10.3 mg/dL 8.8  8.1  8.2   Total Protein 6.5 - 8.1 g/dL 5.7  5.7    Total Bilirubin <1.2 mg/dL 0.5  0.4    Alkaline Phos 38 - 126 U/L 65  79    AST 15 - 41 U/L 21  16    ALT 0 - 44 U/L 24  9       No results found for: "CEA1", "CEA" / No results found for: "CEA1", "CEA" No results found for: "PSA1" No results found for: "JOA416" No results found for: "CAN125"  No results found for: "TOTALPROTELP", "ALBUMINELP", "A1GS", "A2GS", "BETS", "BETA2SER", "GAMS", "MSPIKE", "SPEI" Lab Results  Component Value Date   TIBC 241 (L) 12/20/2022   TIBC 215 (L) 09/18/2022   TIBC 227 (L) 06/05/2022   FERRITIN 715 (H) 12/20/2022   FERRITIN 596 (H) 09/18/2022   FERRITIN 310 (H) 06/05/2022   IRONPCTSAT 12 12/20/2022   IRONPCTSAT 15 09/18/2022   IRONPCTSAT 17 06/05/2022   Lab Results  Component Value Date   LDH 178 12/29/2022   LDH 185 12/25/2022   LDH 237 (H) 07/17/2022     STUDIES:   MM 3D SCREENING MAMMOGRAM BILATERAL BREAST Result Date: 03/06/2023 CLINICAL DATA:  Screening. EXAM: DIGITAL SCREENING BILATERAL MAMMOGRAM WITH TOMOSYNTHESIS AND CAD TECHNIQUE: Bilateral screening digital craniocaudal and mediolateral oblique mammograms were obtained. Bilateral screening digital breast tomosynthesis was performed. The images were evaluated with computer-aided detection. COMPARISON:  Previous exam(s). ACR Breast Density Category b: There are scattered areas of fibroglandular density. FINDINGS: There are no findings suspicious for malignancy. IMPRESSION: No mammographic evidence of malignancy. A result letter of this screening mammogram will be mailed directly to the patient. RECOMMENDATION: Screening mammogram in one year. (Code:SM-B-01Y) BI-RADS CATEGORY  1: Negative. Electronically Signed   By: Elberta Fortis M.D.   On: 03/06/2023 15:59   CT Chest W Contrast Result Date: 02/28/2023 CLINICAL DATA:  Follow up adenocarcinoma left lung. * Tracking Code: BO * EXAM: CT CHEST WITH CONTRAST TECHNIQUE: Multidetector CT imaging of the chest was performed during intravenous contrast administration. RADIATION DOSE REDUCTION: This exam was performed according to the departmental dose-optimization program which includes automated exposure control, adjustment of the mA and/or kV according to patient size and/or use of iterative reconstruction technique. CONTRAST:  75mL OMNIPAQUE IOHEXOL 300 MG/ML  SOLN COMPARISON:  CT 01/01/2023. FINDINGS: Cardiovascular: Heart is slightly enlarged. Small pericardial effusion, similar to previous. Coronary artery calcifications are seen. There is also calcifications along the mitral annulus and along the area of the aortic valve. The thoracic aorta has a normal course and caliber with mild atherosclerotic calcified plaque. Slight enlargement of the main pulmonary  artery, similar to previous. Right IJ chest port in place. The port is accessed. Tip seen as far as the SVC right atrial junction. Mediastinum/Nodes: Thyroid gland is unremarkable. Slightly patulous thoracic esophagus. No specific abnormal lymph node enlargement identified in the axillary region, hilum or mediastinum. Lungs/Pleura: Posttreatment parenchymal consolidation again seen in the left hemithorax extending to the hilum and lateral on superiorly. There is components of distortion of the lung. Pleural thickening and small left effusion. Extent and distribution is similar to the previous examination. No new left-sided dominant lung mass. The areas of air along the pleural effusion have improved in the left lung base. Previously there is patchy opacities and ground-glass in the right lung in a perihilar distribution. This has markedly improved with some residual. Some peripheral subpleural nodular areas are also stable such as series 3, image 65 today measuring 11 mm. Decreasing right pleural effusion with some residual. Areas of bronchiectasis. Upper Abdomen: Adrenal glands are preserved in the upper abdomen. Surgical changes from gastric bypass. There is a aggressive appearing mass seen in the right hepatic lobe at the edge of the imaging field this has seen on the prior abdomen pelvis CT of 01/11/2023 at that time measuring 3.5 x 4.1 cm and today's study 4.3 by 4.0 cm. Prior cholecystectomy. Musculoskeletal: Streak artifact from left shoulder arthroplasty. Advanced degenerative changes of the right shoulder. Curvature and degenerative changes along the spine. IMPRESSION: Decreasing areas of perihilar parenchymal ground-glass opacities and reticular changes compared to the CT scan of October 2024. Some residual. Is also decreasing left effusion slightly with improved components of air from the prior. Stable areas of lung nodularity and left lung consolidation. No developing new mass lesion, lymph node  enlargement. Stable cardiac enlargement with a small pericardial effusion. Persistent right hepatic lobe mass lesion at the edge of the imaging field.  Please correlate with the dedicated evaluation of the abdomen and pelvis as clinically appropriate. Aortic Atherosclerosis (ICD10-I70.0). Electronically Signed   By: Karen Kays M.D.   On: 02/28/2023 14:35

## 2023-03-01 ENCOUNTER — Inpatient Hospital Stay: Payer: Medicare HMO

## 2023-03-01 ENCOUNTER — Encounter: Payer: Self-pay | Admitting: Hematology

## 2023-03-01 ENCOUNTER — Inpatient Hospital Stay (HOSPITAL_BASED_OUTPATIENT_CLINIC_OR_DEPARTMENT_OTHER): Payer: Medicare HMO | Admitting: Hematology

## 2023-03-01 ENCOUNTER — Inpatient Hospital Stay: Payer: Medicare HMO | Attending: Neurosurgery

## 2023-03-01 VITALS — BP 111/59 | HR 99 | Temp 98.1°F | Resp 22

## 2023-03-01 VITALS — BP 121/71 | HR 74 | Temp 97.8°F | Resp 20 | Wt 218.0 lb

## 2023-03-01 DIAGNOSIS — Z5111 Encounter for antineoplastic chemotherapy: Secondary | ICD-10-CM | POA: Insufficient documentation

## 2023-03-01 DIAGNOSIS — C3492 Malignant neoplasm of unspecified part of left bronchus or lung: Secondary | ICD-10-CM

## 2023-03-01 DIAGNOSIS — C787 Secondary malignant neoplasm of liver and intrahepatic bile duct: Secondary | ICD-10-CM | POA: Insufficient documentation

## 2023-03-01 DIAGNOSIS — E876 Hypokalemia: Secondary | ICD-10-CM

## 2023-03-01 DIAGNOSIS — C3412 Malignant neoplasm of upper lobe, left bronchus or lung: Secondary | ICD-10-CM | POA: Insufficient documentation

## 2023-03-01 DIAGNOSIS — Z79631 Long term (current) use of antimetabolite agent: Secondary | ICD-10-CM | POA: Insufficient documentation

## 2023-03-01 DIAGNOSIS — Z79620 Long term (current) use of immunosuppressive biologic: Secondary | ICD-10-CM | POA: Diagnosis not present

## 2023-03-01 LAB — CBC WITH DIFFERENTIAL/PLATELET
Abs Immature Granulocytes: 0 10*3/uL (ref 0.00–0.07)
Basophils Absolute: 0 10*3/uL (ref 0.0–0.1)
Basophils Relative: 1 %
Eosinophils Absolute: 0.1 10*3/uL (ref 0.0–0.5)
Eosinophils Relative: 2 %
HCT: 34.8 % — ABNORMAL LOW (ref 36.0–46.0)
Hemoglobin: 10.5 g/dL — ABNORMAL LOW (ref 12.0–15.0)
Immature Granulocytes: 0 %
Lymphocytes Relative: 18 %
Lymphs Abs: 0.8 10*3/uL (ref 0.7–4.0)
MCH: 29.7 pg (ref 26.0–34.0)
MCHC: 30.2 g/dL (ref 30.0–36.0)
MCV: 98.3 fL (ref 80.0–100.0)
Monocytes Absolute: 0.4 10*3/uL (ref 0.1–1.0)
Monocytes Relative: 9 %
Neutro Abs: 3.1 10*3/uL (ref 1.7–7.7)
Neutrophils Relative %: 70 %
Platelets: 213 10*3/uL (ref 150–400)
RBC: 3.54 MIL/uL — ABNORMAL LOW (ref 3.87–5.11)
RDW: 17.8 % — ABNORMAL HIGH (ref 11.5–15.5)
WBC: 4.5 10*3/uL (ref 4.0–10.5)
nRBC: 0 % (ref 0.0–0.2)

## 2023-03-01 LAB — COMPREHENSIVE METABOLIC PANEL
ALT: 24 U/L (ref 0–44)
AST: 21 U/L (ref 15–41)
Albumin: 2.9 g/dL — ABNORMAL LOW (ref 3.5–5.0)
Alkaline Phosphatase: 65 U/L (ref 38–126)
Anion gap: 9 (ref 5–15)
BUN: 11 mg/dL (ref 8–23)
CO2: 27 mmol/L (ref 22–32)
Calcium: 8.8 mg/dL — ABNORMAL LOW (ref 8.9–10.3)
Chloride: 109 mmol/L (ref 98–111)
Creatinine, Ser: 0.83 mg/dL (ref 0.44–1.00)
GFR, Estimated: >60 mL/min (ref >60)
Glucose, Bld: 101 mg/dL — ABNORMAL HIGH (ref 70–99)
Potassium: 3.4 mmol/L — ABNORMAL LOW (ref 3.5–5.1)
Sodium: 145 mmol/L (ref 135–145)
Total Bilirubin: 0.5 mg/dL (ref <1.2)
Total Protein: 5.7 g/dL — ABNORMAL LOW (ref 6.5–8.1)

## 2023-03-01 LAB — TSH: TSH: 3.784 u[IU]/mL (ref 0.350–4.500)

## 2023-03-01 LAB — MAGNESIUM: Magnesium: 1.7 mg/dL (ref 1.7–2.4)

## 2023-03-01 MED ORDER — ONDANSETRON HCL 4 MG/2ML IJ SOLN
8.0000 mg | Freq: Once | INTRAMUSCULAR | Status: AC
  Administered 2023-03-01: 8 mg via INTRAVENOUS
  Filled 2023-03-01: qty 4

## 2023-03-01 MED ORDER — SODIUM CHLORIDE 0.9 % IV SOLN: Freq: Once | INTRAVENOUS | Status: AC

## 2023-03-01 MED ORDER — POTASSIUM CHLORIDE CRYS ER 20 MEQ PO TBCR
40.0000 meq | EXTENDED_RELEASE_TABLET | Freq: Once | ORAL | Status: AC
  Administered 2023-03-01: 40 meq via ORAL
  Filled 2023-03-01: qty 2

## 2023-03-01 MED ORDER — HEPARIN SOD (PORK) LOCK FLUSH 100 UNIT/ML IV SOLN
500.0000 [IU] | Freq: Once | INTRAVENOUS | Status: AC | PRN
Start: 2023-03-01 — End: 2023-03-01
  Administered 2023-03-01: 500 [IU]

## 2023-03-01 MED ORDER — CYANOCOBALAMIN 1000 MCG/ML IJ SOLN
1000.0000 ug | Freq: Once | INTRAMUSCULAR | Status: AC
  Administered 2023-03-01: 1000 ug via INTRAMUSCULAR
  Filled 2023-03-01: qty 1

## 2023-03-01 MED ORDER — SODIUM CHLORIDE 0.9 % IV SOLN
400.0000 mg/m2 | Freq: Once | INTRAVENOUS | Status: AC
  Administered 2023-03-01: 900 mg via INTRAVENOUS
  Filled 2023-03-01: qty 20

## 2023-03-01 MED ORDER — SODIUM CHLORIDE 0.9% FLUSH
10.0000 mL | INTRAVENOUS | Status: DC | PRN
  Administered 2023-03-01: 10 mL

## 2023-03-01 MED ORDER — SODIUM CHLORIDE 0.9% FLUSH
10.0000 mL | Freq: Once | INTRAVENOUS | Status: AC
  Administered 2023-03-01: 10 mL via INTRAVENOUS

## 2023-03-01 NOTE — Progress Notes (Signed)
Labs reviewed today with MD. Rip Harbour to proceed with treatment today. Will Give Alimta only, holding Keytruda today per MD

## 2023-03-01 NOTE — Progress Notes (Signed)
Patient presents today for chemotherapy infusion. Patient is in satisfactory condition with no new complaints voiced.  Vital signs are stable.  Labs reviewed by Dr. Ellin Saba during the office visit and all labs are within treatment parameters.  Potassium today is 3.4.  We will give Klor Con 40 mEq PO x one dose today per standing orders by Dr. Ellin Saba.  We will hold Keytruda infusion today due to recent colitis and pneumonitis diagnosis.  We will proceed with treatment per MD orders.   Patient tolerated treatment well with no complaints voiced.  Patient left via wheelchair in stable condition.  Vital signs stable at discharge.  Follow up as scheduled.

## 2023-03-01 NOTE — Patient Instructions (Signed)
[facility] - A DEPT OF [facility]  Discharge Instructions: Thank you for choosing [facility] to provide your oncology and hematology care.  If you have a lab appointment with the Cancer Center - please note that after April 8th, 2024, all labs will be drawn in the cancer center.  You do not have to check in or register with the main entrance as you have in the past but will complete your check-in in the cancer center.  Wear comfortable clothing and clothing appropriate for easy access to any Portacath or PICC line.   We strive to give you quality time with your provider. You may need to reschedule your appointment if you arrive late (15 or more minutes).  Arriving late affects you and other patients whose appointments are after yours.  Also, if you miss three or more appointments without notifying the office, you may be dismissed from the clinic at the provider's discretion.      For prescription refill requests, have your pharmacy contact our office and allow 72 hours for refills to be completed.    Today you received the following chemotherapy and/or immunotherapy agents Alimta.  Pemetrexed Injection What is this medication? PEMETREXED (PEM e TREX ed) treats some types of cancer. It works by slowing down the growth of cancer cells. This medicine may be used for other purposes; ask your health care provider or pharmacist if you have questions. COMMON BRAND NAME(S): Alimta, PEMFEXY, PEMRYDI RTU What should I tell my care team before I take this medication? They need to know if you have any of these conditions: Infection, such as chickenpox, cold sores, or herpes Kidney disease Low blood cell levels (white cells, red cells, and platelets) Lung or breathing disease, such as asthma Radiation therapy An unusual or allergic reaction to pemetrexed, other medications, foods, dyes, or preservatives If you or your partner are pregnant or trying to get  pregnant Breast-feeding How should I use this medication? This medication is injected into a vein. It is given by your care team in a hospital or clinic setting. Talk to your care team about the use of this medication in children. Special care may be needed. Overdosage: If you think you have taken too much of this medicine contact a poison control center or emergency room at once. NOTE: This medicine is only for you. Do not share this medicine with others. What if I miss a dose? Keep appointments for follow-up doses. It is important not to miss your dose. Call your care team if you are unable to keep an appointment. What may interact with this medication? Do not take this medication with any of the following: Live virus vaccines This medication may also interact with the following: Ibuprofen This list may not describe all possible interactions. Give your health care provider a list of all the medicines, herbs, non-prescription drugs, or dietary supplements you use. Also tell them if you smoke, drink alcohol, or use illegal drugs. Some items may interact with your medicine. What should I watch for while using this medication? Your condition will be monitored carefully while you are receiving this medication. This medication may make you feel generally unwell. This is not uncommon as chemotherapy can affect healthy cells as well as cancer cells. Report any side effects. Continue your course of treatment even though you feel ill unless your care team tells you to stop. This medication can cause serious side effects. To reduce the risk,  your care team may give you other medications to take before receiving this one. Be sure to follow the directions from your care team. This medication can cause a rash or redness in areas of the body that have previously had radiation therapy. If you have had radiation therapy, tell your care team if you notice a rash in this area. This medication may increase your  risk of getting an infection. Call your care team for advice if you get a fever, chills, sore throat, or other symptoms of a cold or flu. Do not treat yourself. Try to avoid being around people who are sick. Be careful brushing or flossing your teeth or using a toothpick because you may get an infection or bleed more easily. If you have any dental work done, tell your dentist you are receiving this medication. Avoid taking medications that contain aspirin, acetaminophen, ibuprofen, naproxen, or ketoprofen unless instructed by your care team. These medications may hide a fever. Check with your care team if you have severe diarrhea, nausea, and vomiting, or if you sweat a lot. The loss of too much body fluid may make it dangerous for you to take this medication. Talk to your care team if you or your partner wish to become pregnant or think either of you might be pregnant. This medication can cause serious birth defects if taken during pregnancy and for 6 months after the last dose. A negative pregnancy test is required before starting this medication. A reliable form of contraception is recommended while taking this medication and for 6 months after the last dose. Talk to your care team about reliable forms of contraception. Do not father a child while taking this medication and for 3 months after the last dose. Use a condom while having sex during this time period. Do not breastfeed while taking this medication and for 1 week after the last dose. This medication may cause infertility. Talk to your care team if you are concerned about your fertility. What side effects may I notice from receiving this medication? Side effects that you should report to your care team as soon as possible: Allergic reactions--skin rash, itching, hives, swelling of the face, lips, tongue, or throat Dry cough, shortness of breath or trouble breathing Infection--fever, chills, cough, sore throat, wounds that don't heal, pain or  trouble when passing urine, general feeling of discomfort or being unwell Kidney injury--decrease in the amount of urine, swelling of the ankles, hands, or feet Low red blood cell level--unusual weakness or fatigue, dizziness, headache, trouble breathing Redness, blistering, peeling, or loosening of the skin, including inside the mouth Unusual bruising or bleeding Side effects that usually do not require medical attention (report to your care team if they continue or are bothersome): Fatigue Loss of appetite Nausea Vomiting This list may not describe all possible side effects. Call your doctor for medical advice about side effects. You may report side effects to FDA at 1-800-FDA-1088. Where should I keep my medication? This medication is given in a hospital or clinic. It will not be stored at home. NOTE: This sheet is a summary. It may not cover all possible information. If you have questions about this medicine, talk to your doctor, pharmacist, or health care provider.  2024 Elsevier/Gold Standard (2021-07-12 00:00:00)        To help prevent nausea and vomiting after your treatment, we encourage you to take your nausea medication as directed.  BELOW ARE SYMPTOMS THAT SHOULD BE REPORTED IMMEDIATELY: *FEVER GREATER THAN 100.4  F (38 C) OR HIGHER *CHILLS OR SWEATING *NAUSEA AND VOMITING THAT IS NOT CONTROLLED WITH YOUR NAUSEA MEDICATION *UNUSUAL SHORTNESS OF BREATH *UNUSUAL BRUISING OR BLEEDING *URINARY PROBLEMS (pain or burning when urinating, or frequent urination) *BOWEL PROBLEMS (unusual diarrhea, constipation, pain near the anus) TENDERNESS IN MOUTH AND THROAT WITH OR WITHOUT PRESENCE OF ULCERS (sore throat, sores in mouth, or a toothache) UNUSUAL RASH, SWELLING OR PAIN  UNUSUAL VAGINAL DISCHARGE OR ITCHING   Items with * indicate a potential emergency and should be followed up as soon as possible or go to the Emergency Department if any problems should occur.  Please show the  CHEMOTHERAPY ALERT CARD or IMMUNOTHERAPY ALERT CARD at check-in to the Emergency Department and triage nurse.  Should you have questions after your visit or need to cancel or reschedule your appointment, please contact [facility] - A DEPT OF [facility] [phone]  and follow the prompts.  Office hours are 8:00 a.m. to 4:30 p.m. Monday - Friday. Please note that voicemails left after 4:00 p.m. may not be returned until the following business day.  We are closed weekends and major holidays. You have access to a nurse at all times for urgent questions. Please call the main number to the clinic [phone] and follow the prompts.  For any non-urgent questions, you may also contact your provider using MyChart. We now offer e-Visits for anyone 95 and older to request care online for non-urgent symptoms. For details visit [email or web address].   Also download the MyChart app! Go to the app store, search "MyChart", open the app, select [place], and log in with your MyChart username and password.

## 2023-03-01 NOTE — Progress Notes (Signed)
Patient has been examined by Dr. Ellin Saba. Vital signs and labs have been reviewed by MD - ANC, Creatinine, LFTs, hemoglobin, and platelets are within treatment parameters per M.D. - pt may proceed with treatment. Alimta only today per MD. Primary RN and pharmacy notified.

## 2023-03-01 NOTE — Patient Instructions (Addendum)
[facility] at [facility] Discharge Instructions   You were seen and examined today by Dr. Ellin Saba.  He reviewed the results of your lab work which are normal/stable.   We will proceed with your treatment today. We will not give the [facility] (immunotherapy). We will only give the Alimta today and every 3 weeks.   He reviewed the results of your CT scan. It is good. No evidence of cancer is seen on this exam.   Return as scheduled.    Thank you for choosing [facility] at [facility] to provide your oncology and hematology care.  To afford each patient quality time with our provider, please arrive at least 15 minutes before your scheduled appointment time.   If you have a lab appointment with the Cancer Center please come in thru the Main Entrance and check in at the main information desk.  You need to re-schedule your appointment should you arrive 10 or more minutes late.  We strive to give you quality time with our providers, and arriving late affects you and other patients whose appointments are after yours.  Also, if you no show three or more times for appointments you may be dismissed from the clinic at the providers discretion.     Again, thank you for choosing [facility].  Our hope is that these requests will decrease the amount of time that you wait before being seen by our physicians.       _____________________________________________________________  Should you have questions after your visit to [facility], please contact our office at [phone] and follow the prompts.  Our office hours are 8:00 a.m. and 4:30 p.m. Monday - Friday.  Please note that voicemails left after 4:00 p.m. may not be returned until the following business day.  We are closed weekends and major holidays.  You do have access to a nurse 24-7, just call the main number to the clinic [phone] and do not press any options, hold on  the line and a nurse will answer the phone.    For prescription refill requests, have your pharmacy contact our office and allow 72 hours.    Due to Covid, you will need to wear a mask upon entering the hospital. If you do not have a mask, a mask will be given to you at the Main Entrance upon arrival. For doctor visits, patients may have 1 support person age 38 or older with them. For treatment visits, patients can not have anyone with them due to social distancing guidelines and our immunocompromised population.

## 2023-03-02 ENCOUNTER — Encounter (HOSPITAL_COMMUNITY): Payer: Self-pay

## 2023-03-02 ENCOUNTER — Ambulatory Visit (HOSPITAL_COMMUNITY): Payer: Medicare HMO

## 2023-03-02 DIAGNOSIS — S81802S Unspecified open wound, left lower leg, sequela: Secondary | ICD-10-CM | POA: Diagnosis not present

## 2023-03-02 DIAGNOSIS — R601 Generalized edema: Secondary | ICD-10-CM | POA: Diagnosis not present

## 2023-03-02 DIAGNOSIS — M79661 Pain in right lower leg: Secondary | ICD-10-CM

## 2023-03-02 DIAGNOSIS — S81801S Unspecified open wound, right lower leg, sequela: Secondary | ICD-10-CM

## 2023-03-02 LAB — T4: T4, Total: 7.2 ug/dL (ref 4.5–12.0)

## 2023-03-02 NOTE — Therapy (Addendum)
OUTPATIENT PHYSICAL THERAPY Wound TREATMENT  Progress Note Reporting Period 11/13 to 03/02/23  See note below for Objective Data and Assessment of Progress/Goals.     Patient Name: Denise [name] MRN: [id number] DOB:October 24, 1950, 72 y.o., female Today's Date: 03/02/2023   PCP: Mirna Mires, MD REFERRING PROVIDER: Mirna Mires, MD  END OF SESSION:  PT End of Session - 03/02/23 1137     Visit Number 10    Number of Visits 26    Date for PT Re-Evaluation 03/28/23    Authorization Type [employer]    PT Start Time 1050    PT Stop Time 1130    PT Time Calculation (min) 40 min    Equipment Utilized During Treatment Oxygen    Activity Tolerance Patient tolerated treatment well    Behavior During Therapy WFL for tasks assessed/performed             Past Medical History:  Diagnosis Date   Anemia    Aortic stenosis    Arthritis    Coronary artery calcification seen on CT scan    Dyspnea    Essential hypertension    GERD (gastroesophageal reflux disease)    History of lung cancer    Stage III adenocarcinoma status post chemoradiation   Port-A-Cath in place 01/06/2019   Type 2 diabetes mellitus (HCC)    Past Surgical History:  Procedure Laterality Date   BIOPSY  09/25/2022   Procedure: BIOPSY;  Surgeon: Lanelle Bal, DO;  [facility]: [facility];  Service: Endoscopy;;   CHOLECYSTECTOMY  1997   COLONOSCOPY     COLONOSCOPY WITH PROPOFOL N/A 09/25/2022   Procedure: COLONOSCOPY WITH PROPOFOL;  Surgeon: Lanelle Bal, DO;  Location: [facility];  Service: Endoscopy;  Laterality: N/A;  11:00;ASA 3   ESOPHAGEAL BRUSHING  09/25/2022   Procedure: ESOPHAGEAL BRUSHING;  Surgeon: Lanelle Bal, DO;  [facility]: [facility];  Service: Endoscopy;;   ESOPHAGOGASTRODUODENOSCOPY (EGD) WITH PROPOFOL N/A 09/25/2022   Procedure: ESOPHAGOGASTRODUODENOSCOPY (EGD) WITH PROPOFOL;  Surgeon: Lanelle Bal, DO;  [facility]: [facility];  Service: Endoscopy;  Laterality: N/A;  11:00AM;ASA 3    GASTRIC BYPASS     INCISIONAL HERNIA REPAIR  04/11/11   IR IMAGING GUIDED PORT INSERTION  12/27/2018   Right   IR US GUIDE BX ASP/DRAIN  08/08/2021   LAPAROSCOPIC SALPINGOOPHERECTOMY     LAPAROTOMY  04/11/2011   Procedure: EXPLORATORY LAPAROTOMY;  Surgeon: Clovis Pu. Cornett, MD;  Location: [facility];  Service: General;  Laterality: N/A;  closure port hole   POLYPECTOMY  09/25/2022   Procedure: POLYPECTOMY;  Surgeon: Lanelle Bal, DO;  Location: [facility];  Service: Endoscopy;;   RIGHT/LEFT HEART CATH AND CORONARY ANGIOGRAPHY N/A 12/29/2019   Procedure: RIGHT/LEFT HEART CATH AND CORONARY ANGIOGRAPHY;  Surgeon: Lennette Bihari, MD;  Location: [facility];  Service: Cardiovascular;  Laterality: N/A;   TOTAL HIP ARTHROPLASTY  03/07/2012   Procedure: TOTAL HIP ARTHROPLASTY ANTERIOR APPROACH;  Surgeon: Shelda Pal, MD;  Location: [facility];  Service: Orthopedics;  Laterality: Right;   TOTAL SHOULDER ARTHROPLASTY Left 01/21/2015   TOTAL SHOULDER ARTHROPLASTY Left 01/21/2015   Procedure: LEFT TOTAL SHOULDER ARTHROPLASTY;  Surgeon: Francena Hanly, MD;  Location: [facility];  Service: Orthopedics;  Laterality: Left;   VAGINAL HYSTERECTOMY     Patient Active Problem List   Diagnosis Date Noted   Colitis 01/11/2023   Pneumonia of left lower lobe due to infectious organism 12/28/2022   Acute respiratory failure with  hypoxia (HCC) 12/28/2022   Loculated pleural effusion 12/28/2022   Acute on chronic respiratory failure with hypoxia (HCC) 12/24/2022   Pleural effusion, left 12/24/2022   Multifocal pneumonia 12/24/2022   Atrial fibrillation with RVR (HCC) 12/24/2022   Severe sepsis (HCC) 12/24/2022   Iron deficiency anemia 09/02/2022   Early satiety 09/02/2022   Intramuscular hematoma 08/03/2021   Pulmonary embolus (HCC) 07/17/2021   Type 2 diabetes mellitus (HCC)    Essential hypertension    History of lung cancer    Class 3 obesity    Hypokalemia    Malignant neoplasm metastatic to brain  (HCC) 06/04/2020   Meningioma, cerebral (HCC) 05/18/2020   Abnormal myocardial perfusion study    Aortic stenosis    DOE (dyspnea on exertion) 10/09/2019   Meningioma (HCC) 03/20/2019   Port-A-Cath in place 01/06/2019   Adenocarcinoma of left lung (HCC) 12/09/2018   S/P shoulder replacement 01/21/2015   Expected blood loss anemia 03/08/2012   Morbid obesity with BMI of 45.0-49.9, adult (HCC) 03/08/2012   S/P right THA, AA 03/07/2012   Small bowel obstruction (HCC) 04/11/2011   Incisional hernia with obstruction 04/11/2011   Pelvic mass 03/29/2011   Radicular low back pain 11/03/2010   Hip arthritis 11/03/2010    ONSET DATE: 12/28/22  REFERRING DIAG:    U98.119J (ICD-10-CM) - Unspecified open wound, left lower leg, initial encounter  R60.0 (ICD-10-CM) - Localized edema  C34.92 (ICD-10-CM) - Malignant neoplasm of unspecified part of left bronchus or lung    THERAPY DIAG:  S81.802; open wounds of left LE, Rt LE wounds  Lt LE pain   Rationale for Evaluation and Treatment: Rehabilitation  Wound Therapy - 03/02/23 0001     Subjective Pt arrived with dressing intact on Rt and wearing compression socks on Lt.    Patient and Family Stated Goals wounds to heal    Date of Onset --   chronic   Prior Treatments antibiotic, self care, lasix    Pain Scale 0-10    Pain Score 0-No pain    Evaluation and Treatment Procedures Explained to Patient/Family Yes    Evaluation and Treatment Procedures agreed to    Wound Properties Date First Assessed: 01/15/23 Time First Assessed: 0126 Wound Type: Non-pressure wound Location: Pretibial Location Orientation: Distal;Right Present on Admission: No   Wound Image Images linked: 2    Dressing Type Impregnated gauze (bismuth);Abdominal pads;Compression wrap    Dressing Changed Changed    Dressing Status Old drainage    Dressing Change Frequency PRN    Site / Wound Assessment Pale;Painful    % Wound base Red or Granulating 95%    % Wound base  Yellow/Fibrinous Exudate 5%    Peri-wound Assessment Intact    Drainage Amount Minimal    Drainage Description Serosanguineous    Treatment Cleansed;Debridement (Selective)    Selective Debridement (non-excisional) - Location Rt distal    Selective Debridement (non-excisional) - Tools Used Forceps;Scalpel;Scissors    Selective Debridement (non-excisional) - Tissue Removed slough    Wound Therapy - Clinical Statement see below    Factors Delaying/Impairing Wound Healing Vascular compromise    Hydrotherapy Plan Debridement;Dressing change    Wound Therapy - Frequency 3X / week   May reduce to 2x/week   Wound Therapy - Current Recommendations PT    Wound Plan see for debridement, manual and dressing change    Dressing  Rt: xeroform to wounds followed by profore compression dressing.    Dressing Pt wearing compression stocking on Lt  PATIENT EDUCATION: Education details: Keep bandage on unless it becomes wet or painful  Person educated: Patient Education method: Explanation Education comprehension: returned demonstration   HOME EXERCISE PROGRAM: Ankle pumps, LAQ, hip ab/adduction, marching    GOALS: Goals reviewed with patient? No  SHORT TERM GOALS: Target date: 02/28/23  Pt wound to be 100% granulated  Baseline: Goal status: on going   2.  PT wounds to no longer be weeping  Baseline:  Goal status: MET  3.  Pt pain to be no greater than a 5/10 Baseline:  Goal status: MET   LONG TERM GOALS: Target date: 03/22/22  PT to have no pain in her  LE's Baseline:  Goal status: MET  2.  PT wound to be healed  Baseline: 03/02/23: Lt LE has healed, continue with Rt LE.   Goal status: IN PROGRESS  3.  Pt to verbalize that she understands the importance of wearing compression garment, completing her exercises  Baseline: 12/123/24:  Pt wearing compression garment with Lt LE. Goal status: MET   ASSESSMENT:  CLINICAL IMPRESSION: Pt arrived with compression  garment on Lt LE and profore on Rt LE.  Pt with compression garment folded, educated to not fold and to watch for wrinkles.  Noted stain on Lt compression garments as well, therapist removed stocking but unable to find any openings on leg.  Educated to clean and air dry and to watch leg for any openings.  Rt LE is approximating well with granulation at 95% following debridement today.  Continued with xeroform and profore on Rt LE with reports of comfort at EOS.    OBJECTIVE IMPAIRMENTS: decreased activity tolerance, difficulty walking, increased edema, and pain.   ACTIVITY LIMITATIONS: dressing, hygiene/grooming, and locomotion level  PARTICIPATION LIMITATIONS: community activity  PERSONAL FACTORS: Time since onset of injury/illness/exacerbation are also affecting patient's functional outcome. Type 2 diabetes mellitus (HCC), Metastatic adenocarcinoma of the lung,    Essential hypertensiChronic venous stasis edema BLEs - Right greater than left   Class 3 obesity   Atrial fibrillation with RVR (HCC  REHAB POTENTIAL: Good  CLINICAL DECISION MAKING: Stable/uncomplicated  EVALUATION COMPLEXITY: Moderate  PLAN: PT FREQUENCY: 2x/week  PT DURATION: 8 weeks  PLANNED INTERVENTIONS: 97110-Therapeutic exercises, 97535- Self Care, 27253- Manual therapy, Patient/Family education, Manual lymph drainage, Compression bandaging, and wound care  to include debridement.    PLAN FOR NEXT SESSION: Continue with debridement and dressing change as indicated.  Complete weekly measurements and photographs.  Complete 10th visit PN next session.    Becky Sax, LPTA/CLT; CBIS [phone] Virgina Organ, PT CLT [phone]  03/02/2023, 1:17 PM

## 2023-03-05 ENCOUNTER — Ambulatory Visit (HOSPITAL_COMMUNITY): Payer: Medicare HMO | Admitting: Physical Therapy

## 2023-03-05 ENCOUNTER — Ambulatory Visit (HOSPITAL_COMMUNITY)
Admission: RE | Admit: 2023-03-05 | Discharge: 2023-03-05 | Disposition: A | Discharge status: Home / Self Care | Payer: Medicare HMO | Source: Ambulatory Visit | Attending: Family Medicine | Admitting: Family Medicine

## 2023-03-05 ENCOUNTER — Encounter (HOSPITAL_COMMUNITY): Payer: Self-pay

## 2023-03-05 DIAGNOSIS — S81802S Unspecified open wound, left lower leg, sequela: Secondary | ICD-10-CM

## 2023-03-05 DIAGNOSIS — R601 Generalized edema: Secondary | ICD-10-CM

## 2023-03-05 DIAGNOSIS — M79661 Pain in right lower leg: Secondary | ICD-10-CM

## 2023-03-05 DIAGNOSIS — Z1231 Encounter for screening mammogram for malignant neoplasm of breast: Secondary | ICD-10-CM | POA: Insufficient documentation

## 2023-03-05 DIAGNOSIS — S81801S Unspecified open wound, right lower leg, sequela: Secondary | ICD-10-CM

## 2023-03-05 NOTE — Therapy (Signed)
OUTPATIENT PHYSICAL THERAPY Wound TREATMENT     Patient Name: Denise [name] MRN: [id number] DOB:March 11, 1951, 72 y.o., female Today's Date: 03/05/2023   PCP: Mirna Mires, MD REFERRING PROVIDER: Mirna Mires, MD  END OF SESSION:  PT End of Session - 03/05/23 1141     Visit Number 11    Number of Visits 26    Date for PT Re-Evaluation 03/28/23    Authorization Type [employer]    PT Start Time 1015    PT Stop Time 1048    PT Time Calculation (min) 33 min    Equipment Utilized During Treatment Oxygen    Activity Tolerance Patient tolerated treatment well    Behavior During Therapy WFL for tasks assessed/performed             Past Medical History:  Diagnosis Date   Anemia    Aortic stenosis    Arthritis    Coronary artery calcification seen on CT scan    Dyspnea    Essential hypertension    GERD (gastroesophageal reflux disease)    History of lung cancer    Stage III adenocarcinoma status post chemoradiation   Port-A-Cath in place 01/06/2019   Type 2 diabetes mellitus (HCC)    Past Surgical History:  Procedure Laterality Date   BIOPSY  09/25/2022   Procedure: BIOPSY;  Surgeon: Lanelle Bal, DO;  Location: [facility];  Service: Endoscopy;;   CHOLECYSTECTOMY  1997   COLONOSCOPY     COLONOSCOPY WITH PROPOFOL N/A 09/25/2022   Procedure: COLONOSCOPY WITH PROPOFOL;  Surgeon: Lanelle Bal, DO;  [facility]: [facility];  Service: Endoscopy;  Laterality: N/A;  11:00;ASA 3   ESOPHAGEAL BRUSHING  09/25/2022   Procedure: ESOPHAGEAL BRUSHING;  Surgeon: Lanelle Bal, DO;  [facility]: [facility];  Service: Endoscopy;;   ESOPHAGOGASTRODUODENOSCOPY (EGD) WITH PROPOFOL N/A 09/25/2022   Procedure: ESOPHAGOGASTRODUODENOSCOPY (EGD) WITH PROPOFOL;  Surgeon: Lanelle Bal, DO;  [facility]: [facility];  Service: Endoscopy;  Laterality: N/A;  11:00AM;ASA 3   GASTRIC BYPASS     INCISIONAL HERNIA REPAIR  04/11/11   IR IMAGING GUIDED PORT INSERTION  12/27/2018   Right   IR US  GUIDE BX ASP/DRAIN  08/08/2021   LAPAROSCOPIC SALPINGOOPHERECTOMY     LAPAROTOMY  04/11/2011   Procedure: EXPLORATORY LAPAROTOMY;  Surgeon: Clovis Pu. Cornett, MD;  Location: [facility];  Service: General;  Laterality: N/A;  closure port hole   POLYPECTOMY  09/25/2022   Procedure: POLYPECTOMY;  Surgeon: Lanelle Bal, DO;  [facility]: [facility];  Service: Endoscopy;;   RIGHT/LEFT HEART CATH AND CORONARY ANGIOGRAPHY N/A 12/29/2019   Procedure: RIGHT/LEFT HEART CATH AND CORONARY ANGIOGRAPHY;  Surgeon: Lennette Bihari, MD;  [facility]: [facility];  Service: Cardiovascular;  Laterality: N/A;   TOTAL HIP ARTHROPLASTY  03/07/2012   Procedure: TOTAL HIP ARTHROPLASTY ANTERIOR APPROACH;  Surgeon: Shelda Pal, MD;  Location: [facility];  Service: Orthopedics;  Laterality: Right;   TOTAL SHOULDER ARTHROPLASTY Left 01/21/2015   TOTAL SHOULDER ARTHROPLASTY Left 01/21/2015   Procedure: LEFT TOTAL SHOULDER ARTHROPLASTY;  Surgeon: Francena Hanly, MD;  Location: [facility];  Service: Orthopedics;  Laterality: Left;   VAGINAL HYSTERECTOMY     Patient Active Problem List   Diagnosis Date Noted   Colitis 01/11/2023   Pneumonia of left lower lobe due to infectious organism 12/28/2022   Acute respiratory failure with hypoxia (HCC) 12/28/2022   Loculated pleural effusion 12/28/2022   Acute on chronic respiratory failure with hypoxia (HCC)  12/24/2022   Pleural effusion, left 12/24/2022   Multifocal pneumonia 12/24/2022   Atrial fibrillation with RVR (HCC) 12/24/2022   Severe sepsis (HCC) 12/24/2022   Iron deficiency anemia 09/02/2022   Early satiety 09/02/2022   Intramuscular hematoma 08/03/2021   Pulmonary embolus (HCC) 07/17/2021   Type 2 diabetes mellitus (HCC)    Essential hypertension    History of lung cancer    Class 3 obesity    Hypokalemia    Malignant neoplasm metastatic to brain (HCC) 06/04/2020   Meningioma, cerebral (HCC) 05/18/2020   Abnormal myocardial perfusion study    Aortic stenosis     DOE (dyspnea on exertion) 10/09/2019   Meningioma (HCC) 03/20/2019   Port-A-Cath in place 01/06/2019   Adenocarcinoma of left lung (HCC) 12/09/2018   S/P shoulder replacement 01/21/2015   Expected blood loss anemia 03/08/2012   Morbid obesity with BMI of 45.0-49.9, adult (HCC) 03/08/2012   S/P right THA, AA 03/07/2012   Small bowel obstruction (HCC) 04/11/2011   Incisional hernia with obstruction 04/11/2011   Pelvic mass 03/29/2011   Radicular low back pain 11/03/2010   Hip arthritis 11/03/2010    ONSET DATE: 12/28/22  REFERRING DIAG:    Z61.096E (ICD-10-CM) - Unspecified open wound, left lower leg, initial encounter  R60.0 (ICD-10-CM) - Localized edema  C34.92 (ICD-10-CM) - Malignant neoplasm of unspecified part of left bronchus or lung    THERAPY DIAG:  S81.802; open wounds of left LE, Rt LE wounds  Lt LE pain   Rationale for Evaluation and Treatment: Rehabilitation  Wound Therapy - 03/05/23 1142     Subjective pt called clinic reporting blistering on her Lt LE and weeping. Able to work into the schedule to address.  No pain but noted edema and induration.    Patient and Family Stated Goals wounds to heal    Date of Onset --   chronic   Prior Treatments antibiotic, self care, lasix    Pain Scale 0-10    Pain Score 0-No pain    Evaluation and Treatment Procedures Explained to Patient/Family Yes    Evaluation and Treatment Procedures agreed to    Wound Properties Date First Assessed: 03/05/23 Time First Assessed: 1205 Location: Pretibial Location Orientation: Left;Proximal Wound Description (Comments): Left LE, blistering and weeping   Wound Image Images linked: 1    Dressing Type None    Dressing Changed New    Dressing Change Frequency PRN    % Wound base Red or Granulating --   n/a just weeping and blisters present, no open areas   Peri-wound Assessment Induration;Edema    Drainage Amount Minimal    Drainage Description Serous    Treatment Cleansed    Wound  Properties Date First Assessed: 01/15/23 Time First Assessed: 0126 Wound Type: Non-pressure wound Location: Pretibial Location Orientation: Distal;Right Present on Admission: No   Dressing Type Impregnated gauze (bismuth);Abdominal pads;Compression wrap    Dressing Changed Changed    Dressing Status Old drainage    Dressing Change Frequency PRN    Site / Wound Assessment Pale;Painful    % Wound base Red or Granulating 100%    Peri-wound Assessment Intact    Drainage Amount Minimal    Drainage Description Serosanguineous    Treatment Cleansed;Debridement (Selective)    Selective Debridement (non-excisional) - Location Rt distal    Selective Debridement (non-excisional) - Tools Used Forceps;Scalpel;Scissors    Selective Debridement (non-excisional) - Tissue Removed slough    Wound Therapy - Clinical Statement see below  Factors Delaying/Impairing Wound Healing Vascular compromise    Hydrotherapy Plan Debridement;Dressing change    Wound Therapy - Frequency 3X / week   May reduce to 2x/week   Wound Therapy - Current Recommendations PT    Wound Plan see for debridement, manual and dressing change    Dressing  Rt: xeroform to wounds followed by profore compression dressing.    Dressing Lt:  calcium algainate over blisters, profore dressing               PATIENT EDUCATION: Education details: Keep bandage on unless it becomes wet or painful  Person educated: Patient Education method: Explanation Education comprehension: returned demonstration   HOME EXERCISE PROGRAM: Ankle pumps, LAQ, hip ab/adduction, marching    GOALS: Goals reviewed with patient? No  SHORT TERM GOALS: Target date: 02/28/23  Pt wound to be 100% granulated  Baseline: Goal status: on going   2.  PT wounds to no longer be weeping  Baseline:  Goal status: MET  3.  Pt pain to be no greater than a 5/10 Baseline:  Goal status: MET   LONG TERM GOALS: Target date: 03/22/22  PT to have no pain in her   LE's Baseline:  Goal status: MET  2.  PT wound to be healed  Baseline: 03/02/23: Lt LE has healed, continue with Rt LE.   Goal status: IN PROGRESS  3.  Pt to verbalize that she understands the importance of wearing compression garment, completing her exercises  Baseline: 12/123/24:  Pt wearing compression garment with Lt LE. Goal status: MET   ASSESSMENT:  CLINICAL IMPRESSION: Pt comes today without stocking on LT, profore on Rt.  Pt with blistering present on Lt LE and some weeping also.  Bandage removal on Rt revealed 100% granulation and no need for debridement.  Both LE were cleansed well and resumed profore on Lt in addition to Rt LE.  Calcium alginate placed over weeping areas and blisters on Lt LE, xeroform on Rt LE.  Will attempt use of sock butler once able to resume with stocking as feel she may have scratched her LE in attempt to don or not worn correctly leading to return of blistering and induration.  If this does not work will need juxtafit garment and pt aware of cost per leg.  Therapist was told initially that a family member would be putting these on daily, however this did not happen and pt was left to complete this independently.  PT will need more training and education.   OBJECTIVE IMPAIRMENTS: decreased activity tolerance, difficulty walking, increased edema, and pain.   ACTIVITY LIMITATIONS: dressing, hygiene/grooming, and locomotion level  PARTICIPATION LIMITATIONS: community activity  PERSONAL FACTORS: Time since onset of injury/illness/exacerbation are also affecting patient's functional outcome. Type 2 diabetes mellitus (HCC), Metastatic adenocarcinoma of the lung,    Essential hypertensiChronic venous stasis edema BLEs - Right greater than left   Class 3 obesity   Atrial fibrillation with RVR (HCC  REHAB POTENTIAL: Good  CLINICAL DECISION MAKING: Stable/uncomplicated  EVALUATION COMPLEXITY: Moderate  PLAN: PT FREQUENCY: 2x/week  PT DURATION: 8  weeks  PLANNED INTERVENTIONS: 97110-Therapeutic exercises, 97535- Self Care, 16109- Manual therapy, Patient/Family education, Manual lymph drainage, Compression bandaging, and wound care  to include debridement.    PLAN FOR NEXT SESSION: Continue with debridement and dressing change as indicated.  Complete weekly measurements and photographs.  Resume garment with sock butler when able; if this does not work will need juxtafit.  Lurena Nida, PTA/CLT [place]  [facility] Ph: [phone]   03/05/2023, 3:32 PM

## 2023-03-06 ENCOUNTER — Ambulatory Visit (HOSPITAL_COMMUNITY): Payer: Medicare HMO | Admitting: Physical Therapy

## 2023-03-09 ENCOUNTER — Ambulatory Visit (HOSPITAL_COMMUNITY): Payer: Medicare HMO

## 2023-03-09 ENCOUNTER — Encounter (HOSPITAL_COMMUNITY): Payer: Self-pay

## 2023-03-09 DIAGNOSIS — S81802S Unspecified open wound, left lower leg, sequela: Secondary | ICD-10-CM | POA: Diagnosis not present

## 2023-03-09 DIAGNOSIS — S81801S Unspecified open wound, right lower leg, sequela: Secondary | ICD-10-CM

## 2023-03-09 DIAGNOSIS — M79661 Pain in right lower leg: Secondary | ICD-10-CM | POA: Diagnosis not present

## 2023-03-09 DIAGNOSIS — R601 Generalized edema: Secondary | ICD-10-CM | POA: Diagnosis not present

## 2023-03-09 NOTE — Therapy (Signed)
OUTPATIENT PHYSICAL THERAPY Wound TREATMENT     Patient Name: Denise [name] MRN: [id number] DOB:Mar 27, 1950, 72 y.o., female Today's Date: 03/09/2023   PCP: Mirna Mires, MD REFERRING PROVIDER: Mirna Mires, MD  END OF SESSION:  PT End of Session - 03/09/23 1254     Visit Number 12    Number of Visits 26    Date for PT Re-Evaluation 03/28/23    Authorization Type [employer]; requested 16 additional visits on 12/20    Authorization Time Period met ded no copay  cohere approved 12 visits from 01/31/2023-03/07/2023 ([id number])    PT Start Time 1107    PT Stop Time 1200    PT Time Calculation (min) 53 min    Equipment Utilized During Treatment Oxygen    Activity Tolerance Patient tolerated treatment well    Behavior During Therapy WFL for tasks assessed/performed             Past Medical History:  Diagnosis Date   Anemia    Aortic stenosis    Arthritis    Coronary artery calcification seen on CT scan    Dyspnea    Essential hypertension    GERD (gastroesophageal reflux disease)    History of lung cancer    Stage III adenocarcinoma status post chemoradiation   Port-A-Cath in place 01/06/2019   Type 2 diabetes mellitus (HCC)    Past Surgical History:  Procedure Laterality Date   BIOPSY  09/25/2022   Procedure: BIOPSY;  Surgeon: Lanelle Bal, DO;  [facility];  Service: Endoscopy;;   CHOLECYSTECTOMY  1997   COLONOSCOPY     COLONOSCOPY WITH PROPOFOL N/A 09/25/2022   Procedure: COLONOSCOPY WITH PROPOFOL;  Surgeon: Lanelle Bal, DO;  [facility];  Service: Endoscopy;  Laterality: N/A;  11:00;ASA 3   ESOPHAGEAL BRUSHING  09/25/2022   Procedure: ESOPHAGEAL BRUSHING;  Surgeon: Lanelle Bal, DO;  [facility];  Service: Endoscopy;;   ESOPHAGOGASTRODUODENOSCOPY (EGD) WITH PROPOFOL N/A 09/25/2022   Procedure: ESOPHAGOGASTRODUODENOSCOPY (EGD) WITH PROPOFOL;  Surgeon: Lanelle Bal, DO;  [facility];  Service: Endoscopy;   Laterality: N/A;  11:00AM;ASA 3   GASTRIC BYPASS     INCISIONAL HERNIA REPAIR  04/11/11   IR IMAGING GUIDED PORT INSERTION  12/27/2018   Right   IR US GUIDE BX ASP/DRAIN  08/08/2021   LAPAROSCOPIC SALPINGOOPHERECTOMY     LAPAROTOMY  04/11/2011   Procedure: EXPLORATORY LAPAROTOMY;  Surgeon: Clovis Pu. Cornett, MD;  Location: [facility];  Service: General;  Laterality: N/A;  closure port hole   POLYPECTOMY  09/25/2022   Procedure: POLYPECTOMY;  Surgeon: Lanelle Bal, DO;  [facility];  Service: Endoscopy;;   RIGHT/LEFT HEART CATH AND CORONARY ANGIOGRAPHY N/A 12/29/2019   Procedure: RIGHT/LEFT HEART CATH AND CORONARY ANGIOGRAPHY;  Surgeon: Lennette Bihari, MD;  [facility]: [facility];  Service: Cardiovascular;  Laterality: N/A;   TOTAL HIP ARTHROPLASTY  03/07/2012   Procedure: TOTAL HIP ARTHROPLASTY ANTERIOR APPROACH;  Surgeon: Shelda Pal, MD;  Location: [facility];  Service: Orthopedics;  Laterality: Right;   TOTAL SHOULDER ARTHROPLASTY Left 01/21/2015   TOTAL SHOULDER ARTHROPLASTY Left 01/21/2015   Procedure: LEFT TOTAL SHOULDER ARTHROPLASTY;  Surgeon: Francena Hanly, MD;  Location: [facility];  Service: Orthopedics;  Laterality: Left;   VAGINAL HYSTERECTOMY     Patient Active Problem List   Diagnosis Date Noted   Colitis 01/11/2023   Pneumonia of left lower lobe due to infectious organism 12/28/2022  Acute respiratory failure with hypoxia (HCC) 12/28/2022   Loculated pleural effusion 12/28/2022   Acute on chronic respiratory failure with hypoxia (HCC) 12/24/2022   Pleural effusion, left 12/24/2022   Multifocal pneumonia 12/24/2022   Atrial fibrillation with RVR (HCC) 12/24/2022   Severe sepsis (HCC) 12/24/2022   Iron deficiency anemia 09/02/2022   Early satiety 09/02/2022   Intramuscular hematoma 08/03/2021   Pulmonary embolus (HCC) 07/17/2021   Type 2 diabetes mellitus (HCC)    Essential hypertension    History of lung cancer    Class 3 obesity    Hypokalemia     Malignant neoplasm metastatic to brain (HCC) 06/04/2020   Meningioma, cerebral (HCC) 05/18/2020   Abnormal myocardial perfusion study    Aortic stenosis    DOE (dyspnea on exertion) 10/09/2019   Meningioma (HCC) 03/20/2019   Port-A-Cath in place 01/06/2019   Adenocarcinoma of left lung (HCC) 12/09/2018   S/P shoulder replacement 01/21/2015   Expected blood loss anemia 03/08/2012   Morbid obesity with BMI of 45.0-49.9, adult (HCC) 03/08/2012   S/P right THA, AA 03/07/2012   Small bowel obstruction (HCC) 04/11/2011   Incisional hernia with obstruction 04/11/2011   Pelvic mass 03/29/2011   Radicular low back pain 11/03/2010   Hip arthritis 11/03/2010    ONSET DATE: 12/28/22  REFERRING DIAG:    G40.102V (ICD-10-CM) - Unspecified open wound, left lower leg, initial encounter  R60.0 (ICD-10-CM) - Localized edema  C34.92 (ICD-10-CM) - Malignant neoplasm of unspecified part of left bronchus or lung    THERAPY DIAG:  S81.802; open wounds of left LE, Rt LE wounds  Lt LE pain   Rationale for Evaluation and Treatment: Rehabilitation  Wound Therapy - 03/09/23 0001     Subjective Pt reports her left thigh has started wheeping.  Increased difficulty breathing today, made it half way to wound room and requested to be pushed back to room.    Patient and Family Stated Goals wounds to heal    Date of Onset --   chronic   Prior Treatments antibiotic, self care, lasix    Pain Scale 0-10    Evaluation and Treatment Procedures Explained to Patient/Family Yes    Evaluation and Treatment Procedures agreed to    Wound Properties Date First Assessed: 03/05/23 Time First Assessed: 1205 Location: Pretibial Location Orientation: Left;Proximal Wound Description (Comments): Left LE, blistering and weeping   Wound Image Images linked: 1    Dressing Type Silver hydrofiber;Compression wrap    Dressing Changed Changed    Dressing Status Old drainage    Dressing Change Frequency PRN    % Wound base Red or  Granulating 100%   following debridement   Peri-wound Assessment Induration;Edema    Drainage Amount Minimal    Drainage Description Serous    Treatment Cleansed;Debridement (Selective)    Wound Properties Date First Assessed: 01/15/23 Time First Assessed: 0126 Wound Type: Non-pressure wound Location: Pretibial Location Orientation: Distal;Right Present on Admission: No   Wound Image Images linked: 2    Dressing Type Impregnated gauze (bismuth);Abdominal pads;Compression wrap    Dressing Changed Changed    Dressing Status Old drainage    Dressing Change Frequency PRN    Site / Wound Assessment Pale;Painful    % Wound base Red or Granulating 100%    Peri-wound Assessment Intact    Drainage Amount Minimal    Drainage Description Serosanguineous    Treatment Cleansed;Debridement (Selective)    Selective Debridement (non-excisional) - Location Rt and Lt    Selective  Debridement (non-excisional) - Tools Used Forceps    Selective Debridement (non-excisional) - Tissue Removed slough    Wound Therapy - Clinical Statement see below    Factors Delaying/Impairing Wound Healing Vascular compromise    Hydrotherapy Plan Debridement;Dressing change    Wound Therapy - Frequency 3X / week    Wound Therapy - Current Recommendations PT    Wound Plan see for debridement, manual and dressing change    Dressing  Rt: xeroform to wounds followed by profore compression dressing.    Dressing Lt:  calcium algainate over blisters, profore dressing               PATIENT EDUCATION: Education details: Keep bandage on unless it becomes wet or painful  Person educated: Patient Education method: Explanation Education comprehension: returned demonstration   HOME EXERCISE PROGRAM: Ankle pumps, LAQ, hip ab/adduction, marching    GOALS: Goals reviewed with patient? No  SHORT TERM GOALS: Target date: 02/28/23  Pt wound to be 100% granulated  Baseline: Goal status: on going   2.  PT wounds to no  longer be weeping  Baseline:  Goal status: MET  3.  Pt pain to be no greater than a 5/10 Baseline:  Goal status: MET   LONG TERM GOALS: Target date: 03/22/22  PT to have no pain in her  LE's Baseline:  Goal status: MET  2.  PT wound to be healed  Baseline: 03/02/23: Lt LE has healed, continue with Rt LE.   Goal status: IN PROGRESS  3.  Pt to verbalize that she understands the importance of wearing compression garment, completing her exercises  Baseline: 12/123/24:  Pt wearing compression garment with Lt LE. Goal status: MET   ASSESSMENT:  CLINICAL IMPRESSION: Increased drainage and very fragile skin with more openings upon removal of dressings (pictures in media).  Selective debridment for devitalized tissue required Lt LE.  Rt LE 100% granulated though continues to have open wounds.  BLE were cleansed well and continued with xeroform on Rt and changed to silverhydrofiber to address drainage on Lt.  Continued with profore for edema control.  Reports of comfort at EOS.  Pt shown butler to assist with compression garments when appropriate.  Pt did not donn this session as drainage present today.  If additional use of butler will not assist with independent donning pt may need other garments like juxtafit as originally though a family member would be available to put on daily though pt has been donning Lt LE independently and feels she scratched LE while putting on causing wounds.  Noted wheaping from Lt thigh, picture in media.  OBJECTIVE IMPAIRMENTS: decreased activity tolerance, difficulty walking, increased edema, and pain.   ACTIVITY LIMITATIONS: dressing, hygiene/grooming, and locomotion level  PARTICIPATION LIMITATIONS: community activity  PERSONAL FACTORS: Time since onset of injury/illness/exacerbation are also affecting patient's functional outcome. Type 2 diabetes mellitus (HCC), Metastatic adenocarcinoma of the lung,    Essential hypertensiChronic venous stasis edema  BLEs - Right greater than left   Class 3 obesity   Atrial fibrillation with RVR (HCC  REHAB POTENTIAL: Good  CLINICAL DECISION MAKING: Stable/uncomplicated  EVALUATION COMPLEXITY: Moderate  PLAN: PT FREQUENCY: 2x/week  PT DURATION: 8 weeks  PLANNED INTERVENTIONS: 97110-Therapeutic exercises, 97535- Self Care, 65784- Manual therapy, Patient/Family education, Manual lymph drainage, Compression bandaging, and wound care  to include debridement.    PLAN FOR NEXT SESSION: Continue with debridement and dressing change as indicated.  Complete weekly measurements and photographs.  Resume garment with sock butler  when able; if this does not work will need juxtafit.  Becky Sax, LPTA/CLT; [facility] [phone]  Juel Burrow, PTA 03/09/2023, 1:15 PM  03/09/2023, 1:15 PM  [employer] Auth Request  Referring diagnosis code (ICD 10)? U72.536 Treatment diagnosis codes (ICD 10)? (if different than referring diagnosis) R60.1; S81.801S; S81. 802S What was this (referring dx) caused by? []  Surgery []  Fall []  Ongoing issue []  Arthritis [x]  Other: ____________  Laterality: []  Rt []  Lt [x]  Both  Deficits: []  Pain []  Stiffness []  Weakness [x]  Edema []  Balance Deficits []  Coordination []  Gait Disturbance []  ROM [x]  Other   Functional Tool Score: N/A  CPT codes: See Planned Interventions listed in the Plan section of the Evaluation.

## 2023-03-13 ENCOUNTER — Encounter: Payer: Self-pay | Admitting: Hematology

## 2023-03-15 ENCOUNTER — Ambulatory Visit (HOSPITAL_COMMUNITY): Payer: Medicare HMO

## 2023-03-15 ENCOUNTER — Encounter (HOSPITAL_COMMUNITY): Payer: Self-pay

## 2023-03-15 DIAGNOSIS — R601 Generalized edema: Secondary | ICD-10-CM | POA: Diagnosis not present

## 2023-03-15 DIAGNOSIS — S81802S Unspecified open wound, left lower leg, sequela: Secondary | ICD-10-CM | POA: Diagnosis not present

## 2023-03-15 DIAGNOSIS — M79661 Pain in right lower leg: Secondary | ICD-10-CM | POA: Diagnosis not present

## 2023-03-15 DIAGNOSIS — S81801S Unspecified open wound, right lower leg, sequela: Secondary | ICD-10-CM

## 2023-03-15 NOTE — Therapy (Signed)
OUTPATIENT PHYSICAL THERAPY Wound TREATMENT     Patient Name: Denise [name] MRN: [id number] DOB:1950-12-28, 72 y.o., female Today's Date: 03/15/2023   PCP: Mirna Mires, MD REFERRING PROVIDER: Mirna Mires, MD  END OF SESSION:  PT End of Session - 03/15/23 1335     Visit Number 13    Number of Visits 26    Date for PT Re-Evaluation 03/28/23    Authorization Type [employer]; requested 16 additional visits on 12/20    Authorization Time Period met ded no copay  cohere approved 12 visits from 01/31/2023-03/07/2023 ([id number])    PT Start Time 1050    PT Stop Time 1140    PT Time Calculation (min) 50 min    Equipment Utilized During Treatment Oxygen    Activity Tolerance Patient tolerated treatment well    Behavior During Therapy WFL for tasks assessed/performed             Past Medical History:  Diagnosis Date   Anemia    Aortic stenosis    Arthritis    Coronary artery calcification seen on CT scan    Dyspnea    Essential hypertension    GERD (gastroesophageal reflux disease)    History of lung cancer    Stage III adenocarcinoma status post chemoradiation   Port-A-Cath in place 01/06/2019   Type 2 diabetes mellitus (HCC)    Past Surgical History:  Procedure Laterality Date   BIOPSY  09/25/2022   Procedure: BIOPSY;  Surgeon: Lanelle Bal, DO;  [facility]: [facility];  Service: Endoscopy;;   CHOLECYSTECTOMY  1997   COLONOSCOPY     COLONOSCOPY WITH PROPOFOL N/A 09/25/2022   Procedure: COLONOSCOPY WITH PROPOFOL;  Surgeon: Lanelle Bal, DO;  [facility]: [facility];  Service: Endoscopy;  Laterality: N/A;  11:00;ASA 3   ESOPHAGEAL BRUSHING  09/25/2022   Procedure: ESOPHAGEAL BRUSHING;  Surgeon: Lanelle Bal, DO;  [facility]: [facility];  Service: Endoscopy;;   ESOPHAGOGASTRODUODENOSCOPY (EGD) WITH PROPOFOL N/A 09/25/2022   Procedure: ESOPHAGOGASTRODUODENOSCOPY (EGD) WITH PROPOFOL;  Surgeon: Lanelle Bal, DO;  [facility];  Service: Endoscopy;   Laterality: N/A;  11:00AM;ASA 3   GASTRIC BYPASS     INCISIONAL HERNIA REPAIR  04/11/11   IR IMAGING GUIDED PORT INSERTION  12/27/2018   Right   IR US GUIDE BX ASP/DRAIN  08/08/2021   LAPAROSCOPIC SALPINGOOPHERECTOMY     LAPAROTOMY  04/11/2011   Procedure: EXPLORATORY LAPAROTOMY;  Surgeon: Clovis Pu. Cornett, MD;  Location: [facility];  Service: General;  Laterality: N/A;  closure port hole   POLYPECTOMY  09/25/2022   Procedure: POLYPECTOMY;  Surgeon: Lanelle Bal, DO;  [facility]: [facility];  Service: Endoscopy;;   RIGHT/LEFT HEART CATH AND CORONARY ANGIOGRAPHY N/A 12/29/2019   Procedure: RIGHT/LEFT HEART CATH AND CORONARY ANGIOGRAPHY;  Surgeon: Lennette Bihari, MD;  [facility]: [facility];  Service: Cardiovascular;  Laterality: N/A;   TOTAL HIP ARTHROPLASTY  03/07/2012   Procedure: TOTAL HIP ARTHROPLASTY ANTERIOR APPROACH;  Surgeon: Shelda Pal, MD;  Location: [facility];  Service: Orthopedics;  Laterality: Right;   TOTAL SHOULDER ARTHROPLASTY Left 01/21/2015   TOTAL SHOULDER ARTHROPLASTY Left 01/21/2015   Procedure: LEFT TOTAL SHOULDER ARTHROPLASTY;  Surgeon: Francena Hanly, MD;  Location: [facility];  Service: Orthopedics;  Laterality: Left;   VAGINAL HYSTERECTOMY     Patient Active Problem List   Diagnosis Date Noted   Colitis 01/11/2023   Pneumonia of left lower lobe due to infectious organism 12/28/2022  Acute respiratory failure with hypoxia (HCC) 12/28/2022   Loculated pleural effusion 12/28/2022   Acute on chronic respiratory failure with hypoxia (HCC) 12/24/2022   Pleural effusion, left 12/24/2022   Multifocal pneumonia 12/24/2022   Atrial fibrillation with RVR (HCC) 12/24/2022   Severe sepsis (HCC) 12/24/2022   Iron deficiency anemia 09/02/2022   Early satiety 09/02/2022   Intramuscular hematoma 08/03/2021   Pulmonary embolus (HCC) 07/17/2021   Type 2 diabetes mellitus (HCC)    Essential hypertension    History of lung cancer    Class 3 obesity    Hypokalemia     Malignant neoplasm metastatic to brain (HCC) 06/04/2020   Meningioma, cerebral (HCC) 05/18/2020   Abnormal myocardial perfusion study    Aortic stenosis    DOE (dyspnea on exertion) 10/09/2019   Meningioma (HCC) 03/20/2019   Port-A-Cath in place 01/06/2019   Adenocarcinoma of left lung (HCC) 12/09/2018   S/P shoulder replacement 01/21/2015   Expected blood loss anemia 03/08/2012   Morbid obesity with BMI of 45.0-49.9, adult (HCC) 03/08/2012   S/P right THA, AA 03/07/2012   Small bowel obstruction (HCC) 04/11/2011   Incisional hernia with obstruction 04/11/2011   Pelvic mass 03/29/2011   Radicular low back pain 11/03/2010   Hip arthritis 11/03/2010    ONSET DATE: 12/28/22  REFERRING DIAG:    U27.253G (ICD-10-CM) - Unspecified open wound, left lower leg, initial encounter  R60.0 (ICD-10-CM) - Localized edema  C34.92 (ICD-10-CM) - Malignant neoplasm of unspecified part of left bronchus or lung    THERAPY DIAG:  S81.802; open wounds of left LE, Rt LE wounds  Lt LE pain   Rationale for Evaluation and Treatment: Rehabilitation  Wound Therapy - 03/15/23 0001     Subjective Pt arrived with dressings intact, reports increased difficulty breathing today.    Patient and Family Stated Goals wounds to heal    Date of Onset --   chronic   Prior Treatments antibiotic, self care, lasix    Pain Scale 0-10    Pain Score 0-No pain    Evaluation and Treatment Procedures Explained to Patient/Family Yes    Evaluation and Treatment Procedures agreed to    Wound Properties Date First Assessed: 03/05/23 Time First Assessed: 1205 Location: Pretibial Location Orientation: Left;Proximal Wound Description (Comments): Left LE, blistering and weeping   Wound Image Images linked: 1    Dressing Type Impregnated gauze (bismuth);Compression wrap    Dressing Changed Changed    Dressing Status Old drainage    Dressing Change Frequency PRN    % Wound base Red or Granulating 100%    Peri-wound Assessment  Induration;Edema    Drainage Amount Minimal    Drainage Description Serous    Treatment Cleansed;Debridement (Selective)    Wound Properties Date First Assessed: 01/15/23 Time First Assessed: 0126 Wound Type: Non-pressure wound Location: Pretibial Location Orientation: Distal;Right Present on Admission: No   Wound Image Images linked: 2    Dressing Type Impregnated gauze (bismuth);Abdominal pads;Compression wrap    Dressing Changed Changed    Dressing Status Old drainage    Dressing Change Frequency PRN    Site / Wound Assessment Pale;Painful    % Wound base Red or Granulating 100%    Peri-wound Assessment Intact    Drainage Amount Minimal    Drainage Description Serosanguineous    Treatment Cleansed;Debridement (Selective)    Selective Debridement (non-excisional) - Location Rt and Lt    Selective Debridement (non-excisional) - Tools Used Forceps    Selective Debridement (non-excisional) -  Tissue Removed slough    Wound Therapy - Clinical Statement see below    Factors Delaying/Impairing Wound Healing Vascular compromise    Hydrotherapy Plan Debridement;Dressing change    Wound Therapy - Frequency 3X / week    Wound Therapy - Current Recommendations PT    Wound Plan see for debridement, manual and dressing change    Dressing  Both vaseline/lotion, xeroform, profore               PATIENT EDUCATION: Education details: Keep bandage on unless it becomes wet or painful  Person educated: Patient Education method: Explanation Education comprehension: returned demonstration   HOME EXERCISE PROGRAM: Ankle pumps, LAQ, hip ab/adduction, marching    GOALS: Goals reviewed with patient? No  SHORT TERM GOALS: Target date: 02/28/23  Pt wound to be 100% granulated  Baseline: Goal status: on going   2.  PT wounds to no longer be weeping  Baseline:  Goal status: MET  3.  Pt pain to be no greater than a 5/10 Baseline:  Goal status: MET   LONG TERM GOALS: Target date:  03/22/22  PT to have no pain in her  LE's Baseline:  Goal status: MET  2.  PT wound to be healed  Baseline: 03/02/23: Lt LE has healed, continue with Rt LE.   Goal status: IN PROGRESS  3.  Pt to verbalize that she understands the importance of wearing compression garment, completing her exercises  Baseline: 12/123/24:  Pt wearing compression garment with Lt LE. Goal status: MET   ASSESSMENT:  CLINICAL IMPRESSION: Improved granulation upon removal of dressings and decreased drainage.  Selective debridement for removal of slough from wound bed and dry skin perimeter.  Rt LE 100% granulated though continues to have openings.  Continued with xerofrom for Rt and changed to Lt with profore for edema control.  Pt family members found butler on [place], therapist looked at options and gave advice.  Plans to order soon.  Pt encouraged to bring in compression garments to work on donning dressings prior DC to self care to assure ability to donn/doff independently.  Noted increased induration on medial thigh.  Reviewed manual and importance of exercises daily.  Pt reports she has been doing the massage but has reduced her HEP compliance, encouraged to resume.  Attempted to call Apolinar Junes with [facility] but call would not go through concerning pump.  No wheeping noted from Lt thigh today.  OBJECTIVE IMPAIRMENTS: decreased activity tolerance, difficulty walking, increased edema, and pain.   ACTIVITY LIMITATIONS: dressing, hygiene/grooming, and locomotion level  PARTICIPATION LIMITATIONS: community activity  PERSONAL FACTORS: Time since onset of injury/illness/exacerbation are also affecting patient's functional outcome. Type 2 diabetes mellitus (HCC), Metastatic adenocarcinoma of the lung,    Essential hypertensiChronic venous stasis edema BLEs - Right greater than left   Class 3 obesity   Atrial fibrillation with RVR (HCC  REHAB POTENTIAL: Good  CLINICAL DECISION MAKING:  Stable/uncomplicated  EVALUATION COMPLEXITY: Moderate  PLAN: PT FREQUENCY: 2x/week  PT DURATION: 8 weeks  PLANNED INTERVENTIONS: 97110-Therapeutic exercises, 97535- Self Care, 40981- Manual therapy, Patient/Family education, Manual lymph drainage, Compression bandaging, and wound care  to include debridement.    PLAN FOR NEXT SESSION: Continue with debridement and dressing change as indicated.  Complete weekly measurements and photographs.  Resume garment with sock butler when able; if this does not work will need juxtafit.  F/U with pump.  Becky Sax, LPTA/CLT; [facility] [phone]  Juel Burrow, PTA 03/15/2023, 2:31 PM  03/15/2023, 2:31  PM

## 2023-03-16 ENCOUNTER — Other Ambulatory Visit: Payer: Self-pay | Admitting: Hematology

## 2023-03-19 ENCOUNTER — Encounter: Payer: Self-pay | Admitting: Hematology

## 2023-03-19 ENCOUNTER — Ambulatory Visit (HOSPITAL_COMMUNITY): Payer: Medicare HMO | Admitting: Physical Therapy

## 2023-03-19 DIAGNOSIS — M79661 Pain in right lower leg: Secondary | ICD-10-CM | POA: Diagnosis not present

## 2023-03-19 DIAGNOSIS — S81801S Unspecified open wound, right lower leg, sequela: Secondary | ICD-10-CM

## 2023-03-19 DIAGNOSIS — R601 Generalized edema: Secondary | ICD-10-CM

## 2023-03-19 DIAGNOSIS — S81802S Unspecified open wound, left lower leg, sequela: Secondary | ICD-10-CM

## 2023-03-19 NOTE — Therapy (Signed)
OUTPATIENT PHYSICAL THERAPY Wound TREATMENT     Patient Name: Denise [name] MRN: [id number] DOB:Dec 01, 1950, 72 y.o., female Today's Date: 03/19/2023   PCP: Mirna Mires, MD REFERRING PROVIDER: Mirna Mires, MD  END OF SESSION:  PT End of Session - 03/19/23 1139     Visit Number 14    Number of Visits 26    Date for PT Re-Evaluation 03/28/23    Authorization Type [employer]; requested 16 additional visits on 12/20    Authorization Time Period 8 from 12/26-12/31    Authorization - Visit Number 2    Authorization - Number of Visits 8    Progress Note Due on Visit 26    PT Start Time 1055    PT Stop Time 1138    PT Time Calculation (min) 43 min    Equipment Utilized During Treatment Oxygen    Activity Tolerance Patient tolerated treatment well    Behavior During Therapy WFL for tasks assessed/performed             Past Medical History:  Diagnosis Date   Anemia    Aortic stenosis    Arthritis    Coronary artery calcification seen on CT scan    Dyspnea    Essential hypertension    GERD (gastroesophageal reflux disease)    History of lung cancer    Stage III adenocarcinoma status post chemoradiation   Port-A-Cath in place 01/06/2019   Type 2 diabetes mellitus (HCC)    Past Surgical History:  Procedure Laterality Date   BIOPSY  09/25/2022   Procedure: BIOPSY;  Surgeon: Lanelle Bal, DO;  [facility];  Service: Endoscopy;;   CHOLECYSTECTOMY  1997   COLONOSCOPY     COLONOSCOPY WITH PROPOFOL N/A 09/25/2022   Procedure: COLONOSCOPY WITH PROPOFOL;  Surgeon: Lanelle Bal, DO;  [facility];  Service: Endoscopy;  Laterality: N/A;  11:00;ASA 3   ESOPHAGEAL BRUSHING  09/25/2022   Procedure: ESOPHAGEAL BRUSHING;  Surgeon: Lanelle Bal, DO;  [facility];  Service: Endoscopy;;   ESOPHAGOGASTRODUODENOSCOPY (EGD) WITH PROPOFOL N/A 09/25/2022   Procedure: ESOPHAGOGASTRODUODENOSCOPY (EGD) WITH PROPOFOL;  Surgeon: Lanelle Bal, DO;   [facility];  Service: Endoscopy;  Laterality: N/A;  11:00AM;ASA 3   GASTRIC BYPASS     INCISIONAL HERNIA REPAIR  04/11/11   IR IMAGING GUIDED PORT INSERTION  12/27/2018   Right   IR US GUIDE BX ASP/DRAIN  08/08/2021   LAPAROSCOPIC SALPINGOOPHERECTOMY     LAPAROTOMY  04/11/2011   Procedure: EXPLORATORY LAPAROTOMY;  Surgeon: Clovis Pu. Cornett, MD;  Location: [facility];  Service: General;  Laterality: N/A;  closure port hole   POLYPECTOMY  09/25/2022   Procedure: POLYPECTOMY;  Surgeon: Lanelle Bal, DO;  [facility];  Service: Endoscopy;;   RIGHT/LEFT HEART CATH AND CORONARY ANGIOGRAPHY N/A 12/29/2019   Procedure: RIGHT/LEFT HEART CATH AND CORONARY ANGIOGRAPHY;  Surgeon: Lennette Bihari, MD;  [facility];  Service: Cardiovascular;  Laterality: N/A;   TOTAL HIP ARTHROPLASTY  03/07/2012   Procedure: TOTAL HIP ARTHROPLASTY ANTERIOR APPROACH;  Surgeon: Shelda Pal, MD;  Location: [facility];  Service: Orthopedics;  Laterality: Right;   TOTAL SHOULDER ARTHROPLASTY Left 01/21/2015   TOTAL SHOULDER ARTHROPLASTY Left 01/21/2015   Procedure: LEFT TOTAL SHOULDER ARTHROPLASTY;  Surgeon: Francena Hanly, MD;  Location: [facility];  Service: Orthopedics;  Laterality: Left;   VAGINAL HYSTERECTOMY     Patient Active Problem List   Diagnosis Date Noted  Colitis 01/11/2023   Pneumonia of left lower lobe due to infectious organism 12/28/2022   Acute respiratory failure with hypoxia (HCC) 12/28/2022   Loculated pleural effusion 12/28/2022   Acute on chronic respiratory failure with hypoxia (HCC) 12/24/2022   Pleural effusion, left 12/24/2022   Multifocal pneumonia 12/24/2022   Atrial fibrillation with RVR (HCC) 12/24/2022   Severe sepsis (HCC) 12/24/2022   Iron deficiency anemia 09/02/2022   Early satiety 09/02/2022   Intramuscular hematoma 08/03/2021   Pulmonary embolus (HCC) 07/17/2021   Type 2 diabetes mellitus (HCC)    Essential hypertension    History of lung cancer     Class 3 obesity    Hypokalemia    Malignant neoplasm metastatic to brain (HCC) 06/04/2020   Meningioma, cerebral (HCC) 05/18/2020   Abnormal myocardial perfusion study    Aortic stenosis    DOE (dyspnea on exertion) 10/09/2019   Meningioma (HCC) 03/20/2019   Port-A-Cath in place 01/06/2019   Adenocarcinoma of left lung (HCC) 12/09/2018   S/P shoulder replacement 01/21/2015   Expected blood loss anemia 03/08/2012   Morbid obesity with BMI of 45.0-49.9, adult (HCC) 03/08/2012   S/P right THA, AA 03/07/2012   Small bowel obstruction (HCC) 04/11/2011   Incisional hernia with obstruction 04/11/2011   Pelvic mass 03/29/2011   Radicular low back pain 11/03/2010   Hip arthritis 11/03/2010    ONSET DATE: 12/28/22  REFERRING DIAG:    D63.875I (ICD-10-CM) - Unspecified open wound, left lower leg, initial encounter  R60.0 (ICD-10-CM) - Localized edema  C34.92 (ICD-10-CM) - Malignant neoplasm of unspecified part of left bronchus or lung    THERAPY DIAG:  S81.802; open wounds of left LE, Rt LE wounds  Lt LE pain   Rationale for Evaluation and Treatment: Rehabilitation  Wound Therapy - 03/19/23 0001     Subjective Pt arrived with dressings intact, reports increased difficulty breathing today.    Patient and Family Stated Goals wounds to heal    Date of Onset --   chronic   Prior Treatments antibiotic, self care, lasix    Evaluation and Treatment Procedures Explained to Patient/Family Yes    Evaluation and Treatment Procedures agreed to    Wound Properties Date First Assessed: 01/15/23 Time First Assessed: 0126 Wound Type: Non-pressure wound Location: Pretibial Location Orientation: Distal;Right Present on Admission: No   Dressing Type Impregnated gauze (bismuth);Abdominal pads;Compression wrap    Dressing Changed Changed    Dressing Status Old drainage    Dressing Change Frequency PRN    Site / Wound Assessment Pale;Painful    % Wound base Red or Granulating 100%    Peri-wound  Assessment Intact    Drainage Amount Minimal    Drainage Description Serosanguineous    Treatment Cleansed;Debridement (Selective)    Selective Debridement (non-excisional) - Location Rt and Lt    Selective Debridement (non-excisional) - Tools Used Forceps    Selective Debridement (non-excisional) - Tissue Removed slough    Wound Therapy - Clinical Statement see below    Factors Delaying/Impairing Wound Healing Vascular compromise    Hydrotherapy Plan Debridement;Dressing change    Wound Therapy - Frequency 3X / week    Wound Therapy - Current Recommendations PT    Wound Plan see for debridement, manual and dressing change    Dressing  Rt  vaseline/lotion, xeroform, profore    Dressing Lt compression garment donned.             Manual decongestive techniques to thigh area, education on donning compression garment  with butler.   PATIENT EDUCATION: Education details: Keep bandage on unless it becomes wet or painful  Person educated: Patient Education method: Explanation Education comprehension: returned demonstration   HOME EXERCISE PROGRAM: Ankle pumps, LAQ, hip ab/adduction, marching    GOALS: Goals reviewed with patient? No  SHORT TERM GOALS: Target date: 02/28/23  Pt wound to be 100% granulated  Baseline: Goal status: on going   2.  PT wounds to no longer be weeping  Baseline:  Goal status: MET  3.  Pt pain to be no greater than a 5/10 Baseline:  Goal status: MET   LONG TERM GOALS: Target date: 03/22/22  PT to have no pain in her  LE's Baseline:  Goal status: MET  2.  PT wound to be healed  Baseline: 03/02/23: Lt LE has healed, continue with Rt LE.   Goal status: IN PROGRESS  3.  Pt to verbalize that she understands the importance of wearing compression garment, completing her exercises  Baseline: 12/123/24:  Pt wearing compression garment with Lt LE. Goal status: MET   ASSESSMENT:  CLINICAL IMPRESSION: PT ordered Charm Barges on Friday.  Lt LE wound  free, therapist and pt donned compression garment with Charm Barges.  Rt LE continues to have multiple draining wounds therefore will continue to use profore compression bandage system.   Pt encouraged to bring in compression garments to work on donning dressings prior DC to self care to assure ability to donn/doff independently.  Continued increased induration on medial thigh.  Reviewed manual and importance of exercises daily.    OBJECTIVE IMPAIRMENTS: decreased activity tolerance, difficulty walking, increased edema, and pain.   ACTIVITY LIMITATIONS: dressing, hygiene/grooming, and locomotion level  PARTICIPATION LIMITATIONS: community activity  PERSONAL FACTORS: Time since onset of injury/illness/exacerbation are also affecting patient's functional outcome. Type 2 diabetes mellitus (HCC), Metastatic adenocarcinoma of the lung,    Essential hypertensiChronic venous stasis edema BLEs - Right greater than left   Class 3 obesity   Atrial fibrillation with RVR (HCC  REHAB POTENTIAL: Good  CLINICAL DECISION MAKING: Stable/uncomplicated  EVALUATION COMPLEXITY: Moderate  PLAN: PT FREQUENCY: 2x/week  PT DURATION: 8 weeks  PLANNED INTERVENTIONS: 97110-Therapeutic exercises, 97535- Self Care, 19147- Manual therapy, Patient/Family education, Manual lymph drainage, Compression bandaging, and wound care  to include debridement.    PLAN FOR NEXT SESSION: Continue with debridement and dressing change as indicated.  Complete weekly measurements and photographs.  Resume garment with sock butler when able; if this does not work will need juxtafit.  F/U with pump.  Virgina Organ, PT CLT [phone]  03/19/2023, 11:41 AM  03/19/2023, 11:41 AM

## 2023-03-20 ENCOUNTER — Telehealth (HOSPITAL_COMMUNITY): Payer: Self-pay

## 2023-03-20 NOTE — Telephone Encounter (Signed)
 Returned call with reports of blisters arrived last night on Lt LE. Pt encouraged to keep wound covered and will return to wound care on 03/22/23.   Becky Sax, LPTA/CLT; [place] [phone]

## 2023-03-22 ENCOUNTER — Encounter: Payer: Self-pay | Admitting: Hematology

## 2023-03-22 ENCOUNTER — Ambulatory Visit (HOSPITAL_COMMUNITY): Payer: Medicare Other | Attending: Family Medicine | Admitting: Physical Therapy

## 2023-03-22 DIAGNOSIS — M79661 Pain in right lower leg: Secondary | ICD-10-CM | POA: Diagnosis not present

## 2023-03-22 DIAGNOSIS — S81801S Unspecified open wound, right lower leg, sequela: Secondary | ICD-10-CM | POA: Diagnosis not present

## 2023-03-22 DIAGNOSIS — R601 Generalized edema: Secondary | ICD-10-CM | POA: Insufficient documentation

## 2023-03-22 DIAGNOSIS — S81802S Unspecified open wound, left lower leg, sequela: Secondary | ICD-10-CM | POA: Diagnosis not present

## 2023-03-22 DIAGNOSIS — M79605 Pain in left leg: Secondary | ICD-10-CM | POA: Insufficient documentation

## 2023-03-22 NOTE — Therapy (Signed)
 OUTPATIENT PHYSICAL THERAPY Wound TREATMENT     Patient Name: Denise [name] MRN: [id number] DOB:05-01-1950, 73 y.o., female Today's Date: 03/22/2023   PCP: Leigh Lung, MD REFERRING PROVIDER: Leigh Lung, MD  END OF SESSION:  PT End of Session - 03/22/23 1149     Visit Number 15    Number of Visits 26    Date for PT Re-Evaluation 03/28/23    Authorization Type new insurance as of 2025 [facility]    Authorization - Number of Visits 0    Progress Note Due on Visit 26    PT Start Time 1030    PT Stop Time 1130    PT Time Calculation (min) 60 min    Equipment Utilized During Treatment Oxygen     Activity Tolerance Patient tolerated treatment well    Behavior During Therapy WFL for tasks assessed/performed             Past Medical History:  Diagnosis Date   Anemia    Aortic stenosis    Arthritis    Coronary artery calcification seen on CT scan    Dyspnea    Essential hypertension    GERD (gastroesophageal reflux disease)    History of lung cancer    Stage III adenocarcinoma status post chemoradiation   Port-A-Cath in place 01/06/2019   Type 2 diabetes mellitus (HCC)    Past Surgical History:  Procedure Laterality Date   BIOPSY  09/25/2022   Procedure: BIOPSY;  Surgeon: Cindie Carlin POUR, DO;  [facility]: [facility];  Service: Endoscopy;;   CHOLECYSTECTOMY  1997   COLONOSCOPY     COLONOSCOPY WITH PROPOFOL  N/A 09/25/2022   Procedure: COLONOSCOPY WITH PROPOFOL ;  Surgeon: Cindie Carlin POUR, DO;  [facility];  Service: Endoscopy;  Laterality: N/A;  11:00;ASA 3   ESOPHAGEAL BRUSHING  09/25/2022   Procedure: ESOPHAGEAL BRUSHING;  Surgeon: Cindie Carlin POUR, DO;  [facility];  Service: Endoscopy;;   ESOPHAGOGASTRODUODENOSCOPY (EGD) WITH PROPOFOL  N/A 09/25/2022   Procedure: ESOPHAGOGASTRODUODENOSCOPY (EGD) WITH PROPOFOL ;  Surgeon: Cindie Carlin POUR, DO;  [facility];  Service: Endoscopy;  Laterality: N/A;  11:00AM;ASA 3   GASTRIC BYPASS      INCISIONAL HERNIA REPAIR  04/11/11   IR IMAGING GUIDED PORT INSERTION  12/27/2018   Right   IR US  GUIDE BX ASP/DRAIN  08/08/2021   LAPAROSCOPIC SALPINGOOPHERECTOMY     LAPAROTOMY  04/11/2011   Procedure: EXPLORATORY LAPAROTOMY;  Surgeon: Debby LABOR. Cornett, MD;  Location: [facility];  Service: General;  Laterality: N/A;  closure port hole   POLYPECTOMY  09/25/2022   Procedure: POLYPECTOMY;  Surgeon: Cindie Carlin POUR, DO;  [facility];  Service: Endoscopy;;   RIGHT/LEFT HEART CATH AND CORONARY ANGIOGRAPHY N/A 12/29/2019   Procedure: RIGHT/LEFT HEART CATH AND CORONARY ANGIOGRAPHY;  Surgeon: Burnard Debby LABOR, MD;  [facility]: [facility];  Service: Cardiovascular;  Laterality: N/A;   TOTAL HIP ARTHROPLASTY  03/07/2012   Procedure: TOTAL HIP ARTHROPLASTY ANTERIOR APPROACH;  Surgeon: Donnice JONETTA Car, MD;  Location: [facility];  Service: Orthopedics;  Laterality: Right;   TOTAL SHOULDER ARTHROPLASTY Left 01/21/2015   TOTAL SHOULDER ARTHROPLASTY Left 01/21/2015   Procedure: LEFT TOTAL SHOULDER ARTHROPLASTY;  Surgeon: Franky Pointer, MD;  Location: [facility];  Service: Orthopedics;  Laterality: Left;   VAGINAL HYSTERECTOMY     Patient Active Problem List   Diagnosis Date Noted   Colitis 01/11/2023   Pneumonia of left lower lobe due to infectious organism 12/28/2022  Acute respiratory failure with hypoxia (HCC) 12/28/2022   Loculated pleural effusion 12/28/2022   Acute on chronic respiratory failure with hypoxia (HCC) 12/24/2022   Pleural effusion, left 12/24/2022   Multifocal pneumonia 12/24/2022   Atrial fibrillation with RVR (HCC) 12/24/2022   Severe sepsis (HCC) 12/24/2022   Iron deficiency anemia 09/02/2022   Early satiety 09/02/2022   Intramuscular hematoma 08/03/2021   Pulmonary embolus (HCC) 07/17/2021   Type 2 diabetes mellitus (HCC)    Essential hypertension    History of lung cancer    Class 3 obesity    Hypokalemia    Malignant neoplasm metastatic to brain (HCC) 06/04/2020    Meningioma, cerebral (HCC) 05/18/2020   Abnormal myocardial perfusion study    Aortic stenosis    DOE (dyspnea on exertion) 10/09/2019   Meningioma (HCC) 03/20/2019   Port-A-Cath in place 01/06/2019   Adenocarcinoma of left lung (HCC) 12/09/2018   S/P shoulder replacement 01/21/2015   Expected blood loss anemia 03/08/2012   Morbid obesity with BMI of 45.0-49.9, adult (HCC) 03/08/2012   S/P right THA, AA 03/07/2012   Small bowel obstruction (HCC) 04/11/2011   Incisional hernia with obstruction 04/11/2011   Pelvic mass 03/29/2011   Radicular low back pain 11/03/2010   Hip arthritis 11/03/2010    ONSET DATE: 12/28/22  REFERRING DIAG:    D18.197J (ICD-10-CM) - Unspecified open wound, left lower leg, initial encounter  R60.0 (ICD-10-CM) - Localized edema  C34.92 (ICD-10-CM) - Malignant neoplasm of unspecified part of left bronchus or lung    THERAPY DIAG:  S81.802; open wounds of left LE, Rt LE wounds  Lt LE pain   Rationale for Evaluation and Treatment: Rehabilitation  Wound Therapy - 03/22/23 0001     Subjective PT states that she took her compression garment off before she went to bed and moisturized her Lt leg as instructed.  When she woke she had multiple large draining blisters.  Pt kept compression bandaging on RT where pt had continued to have wounds as instructed.     Patient and Family Stated Goals wounds to heal    Date of Onset --   chronic   Prior Treatments antibiotic, self care, lasix     Pain Scale 0-10    Pain Score 5/10   Evaluation and Treatment Procedures Explained to Patient/Family Yes    Evaluation and Treatment Procedures agreed to    Wound Properties Date First Assessed: 01/15/23 Time First Assessed: 0126 Wound Type: Non-pressure wound Location: Pretibial Location Orientation: Distal;Right Present on Admission: No   Wound Image Images linked: 2    Dressing Type Impregnated gauze (bismuth);Abdominal pads;Compression wrap    Dressing Changed Changed     Dressing Status Old drainage    Dressing Change Frequency PRN    Site / Wound Assessment Red    % Wound base Red or Granulating 100%    Peri-wound Assessment Intact    Drainage Amount Scant    Drainage Description Serous    Treatment Cleansed;Debridement (Selective)   manual for decongestive techniques.   Wound Properties Date First Assessed: 03/05/23 Time First Assessed: 1205 Location: Pretibial Location Orientation: Left;Proximal Wound Description (Comments): Left LE, blistering and weeping   Wound Image Images linked: 2    Dressing Type Compression wrap    Dressing Changed Changed    Dressing Status Old drainage;New drainage    Dressing Change Frequency PRN    % Wound base Red or Granulating 100%    Peri-wound Assessment Edema;Other (Comment)    Drainage Amount  Copious   from opened blisters   Drainage Description Serous    Treatment Cleansed   removed dead skin from opened blisters and compression dressing   Selective Debridement (non-excisional) - Location lt dead skin and devitalized tissue    Selective Debridement (non-excisional) - Tools Used Forceps    Wound Therapy - Clinical Statement see below    Factors Delaying/Impairing Wound Healing Vascular compromise    Hydrotherapy Plan Debridement;Dressing change    Wound Therapy - Frequency 3X / week    Wound Therapy - Current Recommendations PT    Wound Plan see for debridement, manual and dressing change    Dressing  Rt  vaseline/lotion, xeroform, profore    Dressing Lt xeroform, profore compression systen             Manual decongestive techniques to thigh area, education on donning compression garment with butler.   PATIENT EDUCATION: Education details: Keep bandage on unless it becomes wet or painful  Person educated: Patient Education method: Explanation Education comprehension: returned demonstration   HOME EXERCISE PROGRAM: Ankle pumps, LAQ, hip ab/adduction, marching    GOALS: Goals reviewed with  patient? No  SHORT TERM GOALS: Target date: 02/28/23  Pt wound to be 100% granulated  Baseline: Goal status: on going   2.  PT wounds to no longer be weeping  Baseline:  Goal status: MET  3.  Pt pain to be no greater than a 5/10 Baseline:  Goal status: MET   LONG TERM GOALS: Target date: 03/22/22  PT to have no pain in her  LE's Baseline:  Goal status: MET  2.  PT wound to be healed  Baseline: 03/02/23: Lt LE has healed, continue with Rt LE.   Goal status: IN PROGRESS  3.  Pt to verbalize that she understands the importance of wearing compression garment, completing her exercises  Baseline: 12/123/24:  Pt wearing compression garment with Lt LE. Goal status: MET   ASSESSMENT:  CLINICAL IMPRESSION: PT has been being seen for eight weeks for debridement and compression dressing for B LE edema and wounds.  PT has been shown LE exercises and has been compliant in completing them.  She has been elevating her LE and completing self manual.  Despite the above the pt continues to have edema.  We are able to control the edema with profore multilayer compression bandaging sx and resolved all wounds on pt Rt LE.  We measured and pt obtained compression garment.  However, she went to bed without compression and woke the next morning with increased edema and multiple large blisters both on the anterior and posterior aspect of her LE,(please see pictures).  For this reason I believe Ms. Blick will benefit from a compression pump, juxtafit compression from B LE to include thighs and night garments.  I feel this is the only way the pt will stay wound and cellulitis free.  OBJECTIVE IMPAIRMENTS: decreased activity tolerance, difficulty walking, increased edema, and pain.   ACTIVITY LIMITATIONS: dressing, hygiene/grooming, and locomotion level  PARTICIPATION LIMITATIONS: community activity  PERSONAL FACTORS: Time since onset of injury/illness/exacerbation are also affecting patient's functional  outcome. Type 2 diabetes mellitus (HCC), Metastatic adenocarcinoma of the lung,    Essential hypertensiChronic venous stasis edema BLEs - Right greater than left   Class 3 obesity   Atrial fibrillation with RVR (HCC  REHAB POTENTIAL: Good  CLINICAL DECISION MAKING: Stable/uncomplicated  EVALUATION COMPLEXITY: Moderate  PLAN: PT FREQUENCY: 2x/week  PT DURATION: 8 weeks  PLANNED INTERVENTIONS: 97110-Therapeutic exercises,  02464- Self Care, 02859- Manual therapy, Patient/Family education, Manual lymph drainage, Compression bandaging, and wound care  to include debridement.    PLAN FOR NEXT SESSION: Continue with debridement and dressing change as indicated.  Complete weekly measurements and photographs.  Resume garment with sock butler when able; if this does not work will need juxtafit.  F/U with pump and Robynn sleeve   Montie Metro, [place] CLT [phone]  03/22/2023, 2:26 PM [facility] Medicare Auth Request Information  Date of referral: 01/24/2023 Referring provider: Elna Potters Referring diagnosis (ICD 10)? M18.19787, R60, C34.92 Treatment diagnosis (ICD 10)? (if different than referring diagnosis) R60, S81.802S, S81.801S, M 79.661, F20.337  Functional Tool Score: N/A  What was this (referring dx) caused by? Other: most likely pt has lymphedema  Nature of Condition: Chronic (continuous duration > 3 months)   Laterality: Both  Current Functional Measure Score: Other n/a -see photo of LE   Objective measurements identify impairments when they are compared to normal values, the uninvolved extremity, and prior level of function.  []  Yes  [x]  No  Objective assessment of functional ability: Moderate functional limitations   Briefly describe symptoms: Significant edema and weeping wounds if pt goes without compression, Significant pain limiting ambulation and ability to complete housework.   How did symptoms start: Unknown pt states that her legs just started swelling, sister has  been dx with lympedema.   Average pain intensity:  Last 24 hours: 5  Past week: 3-pt only has pain  when LE are being cleansed, debrided.  Initially pain was at a 9  How often does the pt experience symptoms? Occasionally  How much have the symptoms interfered with usual daily activities? Moderately  How has condition changed since care began at this facility? Better  In general, how is the pt health : fair  Requesting 12 more visits.

## 2023-03-26 ENCOUNTER — Inpatient Hospital Stay: Payer: Medicare Other | Admitting: Hematology

## 2023-03-26 ENCOUNTER — Inpatient Hospital Stay: Payer: Medicare Other

## 2023-03-26 ENCOUNTER — Inpatient Hospital Stay: Payer: Medicare Other | Attending: Neurosurgery

## 2023-03-26 VITALS — BP 108/50 | HR 116 | Temp 96.7°F | Resp 20 | Wt 221.8 lb

## 2023-03-26 VITALS — BP 94/48 | HR 112 | Temp 98.1°F | Resp 19

## 2023-03-26 DIAGNOSIS — C3412 Malignant neoplasm of upper lobe, left bronchus or lung: Secondary | ICD-10-CM | POA: Insufficient documentation

## 2023-03-26 DIAGNOSIS — I2699 Other pulmonary embolism without acute cor pulmonale: Secondary | ICD-10-CM | POA: Insufficient documentation

## 2023-03-26 DIAGNOSIS — C787 Secondary malignant neoplasm of liver and intrahepatic bile duct: Secondary | ICD-10-CM | POA: Diagnosis not present

## 2023-03-26 DIAGNOSIS — D329 Benign neoplasm of meninges, unspecified: Secondary | ICD-10-CM | POA: Insufficient documentation

## 2023-03-26 DIAGNOSIS — R0600 Dyspnea, unspecified: Secondary | ICD-10-CM | POA: Diagnosis not present

## 2023-03-26 DIAGNOSIS — Z95828 Presence of other vascular implants and grafts: Secondary | ICD-10-CM

## 2023-03-26 DIAGNOSIS — C3492 Malignant neoplasm of unspecified part of left bronchus or lung: Secondary | ICD-10-CM

## 2023-03-26 DIAGNOSIS — Z7901 Long term (current) use of anticoagulants: Secondary | ICD-10-CM | POA: Insufficient documentation

## 2023-03-26 DIAGNOSIS — D649 Anemia, unspecified: Secondary | ICD-10-CM | POA: Insufficient documentation

## 2023-03-26 DIAGNOSIS — Z5111 Encounter for antineoplastic chemotherapy: Secondary | ICD-10-CM | POA: Diagnosis not present

## 2023-03-26 LAB — CBC WITH DIFFERENTIAL/PLATELET
Abs Immature Granulocytes: 0.01 10*3/uL (ref 0.00–0.07)
Basophils Absolute: 0 10*3/uL (ref 0.0–0.1)
Basophils Relative: 1 %
Eosinophils Absolute: 0.1 10*3/uL (ref 0.0–0.5)
Eosinophils Relative: 1 %
HCT: 32.9 % — ABNORMAL LOW (ref 36.0–46.0)
Hemoglobin: 9.4 g/dL — ABNORMAL LOW (ref 12.0–15.0)
Immature Granulocytes: 0 %
Lymphocytes Relative: 14 %
Lymphs Abs: 0.9 10*3/uL (ref 0.7–4.0)
MCH: 28.3 pg (ref 26.0–34.0)
MCHC: 28.6 g/dL — ABNORMAL LOW (ref 30.0–36.0)
MCV: 99.1 fL (ref 80.0–100.0)
Monocytes Absolute: 0.8 10*3/uL (ref 0.1–1.0)
Monocytes Relative: 11 %
Neutro Abs: 5 10*3/uL (ref 1.7–7.7)
Neutrophils Relative %: 73 %
Platelets: 285 10*3/uL (ref 150–400)
RBC: 3.32 MIL/uL — ABNORMAL LOW (ref 3.87–5.11)
RDW: 16.6 % — ABNORMAL HIGH (ref 11.5–15.5)
WBC: 6.8 10*3/uL (ref 4.0–10.5)
nRBC: 0 % (ref 0.0–0.2)

## 2023-03-26 LAB — COMPREHENSIVE METABOLIC PANEL
ALT: 13 U/L (ref 0–44)
AST: 19 U/L (ref 15–41)
Albumin: 2.7 g/dL — ABNORMAL LOW (ref 3.5–5.0)
Alkaline Phosphatase: 72 U/L (ref 38–126)
Anion gap: 7 (ref 5–15)
BUN: 11 mg/dL (ref 8–23)
CO2: 29 mmol/L (ref 22–32)
Calcium: 8.5 mg/dL — ABNORMAL LOW (ref 8.9–10.3)
Chloride: 106 mmol/L (ref 98–111)
Creatinine, Ser: 0.82 mg/dL (ref 0.44–1.00)
GFR, Estimated: >60 mL/min (ref >60)
Glucose, Bld: 118 mg/dL — ABNORMAL HIGH (ref 70–99)
Potassium: 3.3 mmol/L — ABNORMAL LOW (ref 3.5–5.1)
Sodium: 142 mmol/L (ref 135–145)
Total Bilirubin: <0.2 mg/dL (ref 0.0–1.2)
Total Protein: 5.8 g/dL — ABNORMAL LOW (ref 6.5–8.1)

## 2023-03-26 LAB — MAGNESIUM: Magnesium: 1.8 mg/dL (ref 1.7–2.4)

## 2023-03-26 MED ORDER — ONDANSETRON HCL 4 MG/2ML IJ SOLN
8.0000 mg | Freq: Once | INTRAMUSCULAR | Status: AC
  Administered 2023-03-26: 8 mg via INTRAVENOUS
  Filled 2023-03-26: qty 4

## 2023-03-26 MED ORDER — SODIUM CHLORIDE 0.9% FLUSH
10.0000 mL | INTRAVENOUS | Status: DC | PRN
  Administered 2023-03-26: 10 mL via INTRAVENOUS

## 2023-03-26 MED ORDER — SODIUM CHLORIDE 0.9% FLUSH
10.0000 mL | INTRAVENOUS | Status: DC | PRN
  Administered 2023-03-26: 10 mL

## 2023-03-26 MED ORDER — POTASSIUM CHLORIDE CRYS ER 20 MEQ PO TBCR
40.0000 meq | EXTENDED_RELEASE_TABLET | Freq: Once | ORAL | Status: AC
  Administered 2023-03-26: 40 meq via ORAL
  Filled 2023-03-26: qty 2

## 2023-03-26 MED ORDER — SODIUM CHLORIDE 0.9 % IV SOLN: Freq: Once | INTRAVENOUS | Status: AC

## 2023-03-26 MED ORDER — SODIUM CHLORIDE 0.9 % IV SOLN
400.0000 mg/m2 | Freq: Once | INTRAVENOUS | Status: AC
  Administered 2023-03-26: 900 mg via INTRAVENOUS
  Filled 2023-03-26: qty 20

## 2023-03-26 MED ORDER — HEPARIN SOD (PORK) LOCK FLUSH 100 UNIT/ML IV SOLN
500.0000 [IU] | Freq: Once | INTRAVENOUS | Status: AC | PRN
  Administered 2023-03-26: 500 [IU]

## 2023-03-26 NOTE — Patient Instructions (Signed)
 [facility] - A DEPT OF [facility]  Discharge Instructions: Thank you for choosing [facility] to provide your oncology and hematology care.  If you have a lab appointment with the Cancer Center - please note that after April 8th, 2024, all labs will be drawn in the cancer center.  You do not have to check in or register with the main entrance as you have in the past but will complete your check-in in the cancer center.  Wear comfortable clothing and clothing appropriate for easy access to any Portacath or PICC line.   We strive to give you quality time with your provider. You may need to reschedule your appointment if you arrive late (15 or more minutes).  Arriving late affects you and other patients whose appointments are after yours.  Also, if you miss three or more appointments without notifying the office, you may be dismissed from the clinic at the provider's discretion.      For prescription refill requests, have your pharmacy contact our office and allow 72 hours for refills to be completed.    Today you received the following chemotherapy and/or immunotherapy agents Alimta.  Pemetrexed Injection What is this medication? PEMETREXED (PEM e TREX ed) treats some types of cancer. It works by slowing down the growth of cancer cells. This medicine may be used for other purposes; ask your health care provider or pharmacist if you have questions. COMMON BRAND NAME(S): Alimta, PEMFEXY, PEMRYDI RTU What should I tell my care team before I take this medication? They need to know if you have any of these conditions: Infection, such as chickenpox, cold sores, or herpes Kidney disease Low blood cell levels (white cells, red cells, and platelets) Lung or breathing disease, such as asthma Radiation therapy An unusual or allergic reaction to pemetrexed, other medications, foods, dyes, or preservatives If you or your partner are pregnant or trying to get  pregnant Breast-feeding How should I use this medication? This medication is injected into a vein. It is given by your care team in a hospital or clinic setting. Talk to your care team about the use of this medication in children. Special care may be needed. Overdosage: If you think you have taken too much of this medicine contact a poison control center or emergency room at once. NOTE: This medicine is only for you. Do not share this medicine with others. What if I miss a dose? Keep appointments for follow-up doses. It is important not to miss your dose. Call your care team if you are unable to keep an appointment. What may interact with this medication? Do not take this medication with any of the following: Live virus vaccines This medication may also interact with the following: Ibuprofen This list may not describe all possible interactions. Give your health care provider a list of all the medicines, herbs, non-prescription drugs, or dietary supplements you use. Also tell them if you smoke, drink alcohol, or use illegal drugs. Some items may interact with your medicine. What should I watch for while using this medication? Your condition will be monitored carefully while you are receiving this medication. This medication may make you feel generally unwell. This is not uncommon as chemotherapy can affect healthy cells as well as cancer cells. Report any side effects. Continue your course of treatment even though you feel ill unless your care team tells you to stop. This medication can cause serious side effects. To reduce the risk,  your care team may give you other medications to take before receiving this one. Be sure to follow the directions from your care team. This medication can cause a rash or redness in areas of the body that have previously had radiation therapy. If you have had radiation therapy, tell your care team if you notice a rash in this area. This medication may increase your  risk of getting an infection. Call your care team for advice if you get a fever, chills, sore throat, or other symptoms of a cold or flu. Do not treat yourself. Try to avoid being around people who are sick. Be careful brushing or flossing your teeth or using a toothpick because you may get an infection or bleed more easily. If you have any dental work done, tell your dentist you are receiving this medication. Avoid taking medications that contain aspirin, acetaminophen, ibuprofen, naproxen, or ketoprofen unless instructed by your care team. These medications may hide a fever. Check with your care team if you have severe diarrhea, nausea, and vomiting, or if you sweat a lot. The loss of too much body fluid may make it dangerous for you to take this medication. Talk to your care team if you or your partner wish to become pregnant or think either of you might be pregnant. This medication can cause serious birth defects if taken during pregnancy and for 6 months after the last dose. A negative pregnancy test is required before starting this medication. A reliable form of contraception is recommended while taking this medication and for 6 months after the last dose. Talk to your care team about reliable forms of contraception. Do not father a child while taking this medication and for 3 months after the last dose. Use a condom while having sex during this time period. Do not breastfeed while taking this medication and for 1 week after the last dose. This medication may cause infertility. Talk to your care team if you are concerned about your fertility. What side effects may I notice from receiving this medication? Side effects that you should report to your care team as soon as possible: Allergic reactions--skin rash, itching, hives, swelling of the face, lips, tongue, or throat Dry cough, shortness of breath or trouble breathing Infection--fever, chills, cough, sore throat, wounds that don't heal, pain or  trouble when passing urine, general feeling of discomfort or being unwell Kidney injury--decrease in the amount of urine, swelling of the ankles, hands, or feet Low red blood cell level--unusual weakness or fatigue, dizziness, headache, trouble breathing Redness, blistering, peeling, or loosening of the skin, including inside the mouth Unusual bruising or bleeding Side effects that usually do not require medical attention (report to your care team if they continue or are bothersome): Fatigue Loss of appetite Nausea Vomiting This list may not describe all possible side effects. Call your doctor for medical advice about side effects. You may report side effects to FDA at 1-800-FDA-1088. Where should I keep my medication? This medication is given in a hospital or clinic. It will not be stored at home. NOTE: This sheet is a summary. It may not cover all possible information. If you have questions about this medicine, talk to your doctor, pharmacist, or health care provider.  2024 Elsevier/Gold Standard (2021-07-12 00:00:00)        To help prevent nausea and vomiting after your treatment, we encourage you to take your nausea medication as directed.  BELOW ARE SYMPTOMS THAT SHOULD BE REPORTED IMMEDIATELY: *FEVER GREATER THAN 100.4  F (38 C) OR HIGHER *CHILLS OR SWEATING *NAUSEA AND VOMITING THAT IS NOT CONTROLLED WITH YOUR NAUSEA MEDICATION *UNUSUAL SHORTNESS OF BREATH *UNUSUAL BRUISING OR BLEEDING *URINARY PROBLEMS (pain or burning when urinating, or frequent urination) *BOWEL PROBLEMS (unusual diarrhea, constipation, pain near the anus) TENDERNESS IN MOUTH AND THROAT WITH OR WITHOUT PRESENCE OF ULCERS (sore throat, sores in mouth, or a toothache) UNUSUAL RASH, SWELLING OR PAIN  UNUSUAL VAGINAL DISCHARGE OR ITCHING   Items with * indicate a potential emergency and should be followed up as soon as possible or go to the Emergency Department if any problems should occur.  Please show the  CHEMOTHERAPY ALERT CARD or IMMUNOTHERAPY ALERT CARD at check-in to the Emergency Department and triage nurse.  Should you have questions after your visit or need to cancel or reschedule your appointment, please contact [facility] - A DEPT OF [facility] [phone]  and follow the prompts.  Office hours are 8:00 a.m. to 4:30 p.m. Monday - Friday. Please note that voicemails left after 4:00 p.m. may not be returned until the following business day.  We are closed weekends and major holidays. You have access to a nurse at all times for urgent questions. Please call the main number to the clinic [phone] and follow the prompts.  For any non-urgent questions, you may also contact your provider using MyChart. We now offer e-Visits for anyone 95 and older to request care online for non-urgent symptoms. For details visit [email or web address].   Also download the MyChart app! Go to the app store, search "MyChart", open the app, select [place], and log in with your MyChart username and password.

## 2023-03-26 NOTE — Progress Notes (Signed)
 Patient presents today for Alimta  infusion. Heart rate on arrival elevated at 116. Patient denies any fevers, vomiting, unable to eat and drink. Will recheck after patient sits for a few minutes.   Heart rate recheck 114. Patient is on Lasix  and states she has not had any fluids this morning. Patient has complaints of leg pain bilateral from Lymphedema.  Labs obtained peripheral. Labs pending.   Potassium 3.3. Standing orders followed.  Heart rate 83 after resting/ sitting. Treatment released.   Alimta  given today per MD orders. Tolerated infusion without adverse affects. Vital signs stable. No complaints at this time. Discharged from clinic by wheel chair in stable condition. Alert and oriented x 3. F/U with [facility] as scheduled.

## 2023-03-26 NOTE — Progress Notes (Signed)
 Patients port flushed without difficulty. Blood return noted with no bruising or swelling noted at site.  Patient remains accessed for treatment.

## 2023-03-27 ENCOUNTER — Ambulatory Visit (HOSPITAL_COMMUNITY): Payer: Medicare Other | Admitting: Physical Therapy

## 2023-03-27 ENCOUNTER — Other Ambulatory Visit: Payer: Self-pay | Admitting: *Deleted

## 2023-03-27 DIAGNOSIS — R601 Generalized edema: Secondary | ICD-10-CM

## 2023-03-27 DIAGNOSIS — S81801S Unspecified open wound, right lower leg, sequela: Secondary | ICD-10-CM

## 2023-03-27 DIAGNOSIS — S81802S Unspecified open wound, left lower leg, sequela: Secondary | ICD-10-CM | POA: Diagnosis not present

## 2023-03-27 DIAGNOSIS — M79605 Pain in left leg: Secondary | ICD-10-CM

## 2023-03-27 DIAGNOSIS — M79661 Pain in right lower leg: Secondary | ICD-10-CM

## 2023-03-27 MED ORDER — IBUPROFEN 800 MG PO TABS: 800.0000 mg | ORAL_TABLET | Freq: Three times a day (TID) | ORAL | 0 refills | Status: DC | PRN

## 2023-03-27 NOTE — Therapy (Addendum)
 OUTPATIENT PHYSICAL THERAPY Wound TREATMENT Progress Note Reporting Period 01/31/2023 to 03/27/2023  See note below for Objective Data and Assessment of Progress/Goals.        Patient Name: Denise [name] MRN: [id number] DOB:06/14/1950, 73 y.o., female Today's Date: 03/27/2023   PCP: Leigh Lung, MD REFERRING PROVIDER: Leigh Lung, MD  END OF SESSION:  PT End of Session - 03/27/23 1133     Visit Number 16    Number of Visits 26    Date for PT Re-Evaluation 05/22/23    Authorization Type Approved 16 visits 1/2-2/27   Authorization - Number of Visits 2   Progress Note Due on Visit 16   PT Start Time 1140    PT Stop Time 1125    PT Time Calculation (min) 1425 min    Equipment Utilized During Treatment Oxygen     Activity Tolerance Patient tolerated treatment well    Behavior During Therapy [facility] for tasks assessed/performed             Past Medical History:  Diagnosis Date   Anemia    Aortic stenosis    Arthritis    Coronary artery calcification seen on CT scan    Dyspnea    Essential hypertension    GERD (gastroesophageal reflux disease)    History of lung cancer    Stage III adenocarcinoma status post chemoradiation   Port-A-Cath in place 01/06/2019   Type 2 diabetes mellitus (HCC)    Past Surgical History:  Procedure Laterality Date   BIOPSY  09/25/2022   Procedure: BIOPSY;  Surgeon: Cindie Carlin POUR, DO;  [facility];  Service: Endoscopy;;   CHOLECYSTECTOMY  1997   COLONOSCOPY     COLONOSCOPY WITH PROPOFOL  N/A 09/25/2022   Procedure: COLONOSCOPY WITH PROPOFOL ;  Surgeon: Cindie Carlin POUR, DO;  [facility];  Service: Endoscopy;  Laterality: N/A;  11:00;ASA 3   ESOPHAGEAL BRUSHING  09/25/2022   Procedure: ESOPHAGEAL BRUSHING;  Surgeon: Cindie Carlin POUR, DO;  [facility];  Service: Endoscopy;;   ESOPHAGOGASTRODUODENOSCOPY (EGD) WITH PROPOFOL  N/A 09/25/2022   Procedure: ESOPHAGOGASTRODUODENOSCOPY (EGD) WITH PROPOFOL ;  Surgeon:  Cindie Carlin POUR, DO;  [facility];  Service: Endoscopy;  Laterality: N/A;  11:00AM;ASA 3   GASTRIC BYPASS     INCISIONAL HERNIA REPAIR  04/11/11   IR IMAGING GUIDED PORT INSERTION  12/27/2018   Right   IR US  GUIDE BX ASP/DRAIN  08/08/2021   LAPAROSCOPIC SALPINGOOPHERECTOMY     LAPAROTOMY  04/11/2011   Procedure: EXPLORATORY LAPAROTOMY;  Surgeon: Debby LABOR. Cornett, MD;  Location: [facility];  Service: General;  Laterality: N/A;  closure port hole   POLYPECTOMY  09/25/2022   Procedure: POLYPECTOMY;  Surgeon: Cindie Carlin POUR, DO;  [facility];  Service: Endoscopy;;   RIGHT/LEFT HEART CATH AND CORONARY ANGIOGRAPHY N/A 12/29/2019   Procedure: RIGHT/LEFT HEART CATH AND CORONARY ANGIOGRAPHY;  Surgeon: Burnard Debby LABOR, MD;  [facility];  Service: Cardiovascular;  Laterality: N/A;   TOTAL HIP ARTHROPLASTY  03/07/2012   Procedure: TOTAL HIP ARTHROPLASTY ANTERIOR APPROACH;  Surgeon: Donnice JONETTA Car, MD;  Location: [facility];  Service: Orthopedics;  Laterality: Right;   TOTAL SHOULDER ARTHROPLASTY Left 01/21/2015   TOTAL SHOULDER ARTHROPLASTY Left 01/21/2015   Procedure: LEFT TOTAL SHOULDER ARTHROPLASTY;  Surgeon: Franky Pointer, MD;  [facility]: [facility];  Service: Orthopedics;  Laterality: Left;   VAGINAL HYSTERECTOMY     Patient Active Problem List   Diagnosis Date Noted  Colitis 01/11/2023   Pneumonia of left lower lobe due to infectious organism 12/28/2022   Acute respiratory failure with hypoxia (HCC) 12/28/2022   Loculated pleural effusion 12/28/2022   Acute on chronic respiratory failure with hypoxia (HCC) 12/24/2022   Pleural effusion, left 12/24/2022   Multifocal pneumonia 12/24/2022   Atrial fibrillation with RVR (HCC) 12/24/2022   Severe sepsis (HCC) 12/24/2022   Iron deficiency anemia 09/02/2022   Early satiety 09/02/2022   Intramuscular hematoma 08/03/2021   Pulmonary embolus (HCC) 07/17/2021   Type 2 diabetes mellitus (HCC)    Essential hypertension     History of lung cancer    Class 3 obesity    Hypokalemia    Malignant neoplasm metastatic to brain (HCC) 06/04/2020   Meningioma, cerebral (HCC) 05/18/2020   Abnormal myocardial perfusion study    Aortic stenosis    DOE (dyspnea on exertion) 10/09/2019   Meningioma (HCC) 03/20/2019   Port-A-Cath in place 01/06/2019   Adenocarcinoma of left lung (HCC) 12/09/2018   S/P shoulder replacement 01/21/2015   Expected blood loss anemia 03/08/2012   Morbid obesity with BMI of 45.0-49.9, adult (HCC) 03/08/2012   S/P right THA, AA 03/07/2012   Small bowel obstruction (HCC) 04/11/2011   Incisional hernia with obstruction 04/11/2011   Pelvic mass 03/29/2011   Radicular low back pain 11/03/2010   Hip arthritis 11/03/2010    ONSET DATE: 12/28/22  REFERRING DIAG:    D18.197J (ICD-10-CM) - Unspecified open wound, left lower leg, initial encounter  R60.0 (ICD-10-CM) - Localized edema  C34.92 (ICD-10-CM) - Malignant neoplasm of unspecified part of left bronchus or lung    THERAPY DIAG:  S81.802; open wounds of left LE, Rt LE wounds  Lt LE pain   Wound Therapy - 03/27/23 0001     Subjective pt  states her LT LE has been burning and her Rt forefoot is also hurting    Patient and Family Stated Goals wounds to heal    Date of Onset --   chronic   Prior Treatments antibiotic, self care, lasix     Pain Scale 0-10    Pain Score 5     Pain Type Chronic pain    Pain Location Leg    Pain Orientation Right;Left    Pain Descriptors / Indicators Aching;Burning    Evaluation and Treatment Procedures Explained to Patient/Family Yes    Evaluation and Treatment Procedures agreed to    Wound Properties Date First Assessed: 03/05/23 Time First Assessed: 1205 Location: Pretibial Location Orientation: Left;Proximal Wound Description (Comments): Left LE, blistering and weeping   Dressing Type Compression wrap    Dressing Changed Changed    Dressing Status Old drainage;New drainage    Dressing Change  Frequency PRN    Site / Wound Assessment Red;Painful;Granulation tissue;Bleeding    % Wound base Red or Granulating 100%    Peri-wound Assessment Edema;Other (Comment)    Drainage Amount Copious    Drainage Description Sanguineous    Treatment Cleansed    Wound Properties Date First Assessed: 01/15/23 Time First Assessed: 0126 Wound Type: Non-pressure wound Location: Pretibial Location Orientation: Distal;Right Present on Admission: No   Wound Image Images linked: 1    Dressing Type Impregnated gauze (bismuth);Abdominal pads;Compression wrap    Dressing Changed Changed    Dressing Status Old drainage    Dressing Change Frequency PRN    Site / Wound Assessment Red    % Wound base Red or Granulating 100%    Peri-wound Assessment Intact    Drainage  Amount None    Treatment Cleansed    Selective Debridement (non-excisional) - Location dried exudate, dry tissue Lt only    Selective Debridement (non-excisional) - Tools Used Other (comment)   gauze   Wound Therapy - Clinical Statement see below    Factors Delaying/Impairing Wound Healing Vascular compromise    Hydrotherapy Plan Debridement;Dressing change    Wound Therapy - Frequency 3X / week    Wound Therapy - Current Recommendations PT    Wound Plan see for debridement, manual and dressing change    Dressing  Rt  vaseline/lotion, xeroform, profore    Dressing Lt silver hydrofiber, ABD pads, profore compression system                PATIENT EDUCATION: Education details: Keep bandage on unless it becomes wet or painful  Person educated: Patient Education method: Explanation Education comprehension: returned demonstration   HOME EXERCISE PROGRAM: Ankle pumps, LAQ, hip ab/adduction, marching    GOALS: Goals reviewed with patient? No  SHORT TERM GOALS: Target date: 02/28/23  Pt wound to be 100% granulated  Baseline: Goal status: on going   2.  PT wounds to no longer be weeping  Baseline:  Goal status: MET  3.  Pt  pain to be no greater than a 5/10 Baseline:  Goal status: MET   LONG TERM GOALS: Target date: 03/22/22  PT to have no pain in her  LE's Baseline:  Goal status: MET  2.  PT wound to be healed  Baseline: 03/02/23: Lt LE has healed, continue with Rt LE.   Goal status: IN PROGRESS  3.  Pt to verbalize that she understands the importance of wearing compression garment, completing her exercises  Baseline: 12/123/24:  Pt wearing compression garment with Lt LE. Goal status: MET   ASSESSMENT:  CLINICAL IMPRESSION: Progress note completed. Pt returns today with reports of increased burning in Rt dorsal foot and also in LT LE.  Noted slippage of dressing on Rt foot with dome of edema outside of this.  Discussed finding different show as that would not push his dressing back and bunch up the dressing causing increased pressure.  Noted blister neal bend in ankle on dorsal side when removed dressing (see media), however wound that was present medially is now healed without any drainage or opens areas noted.  Instructed to bring her sock donner for Rt LE granted the blister does not open on foot as she will be ready to transition.  Encouraged to return to bed as she admits to sleeping in recliner with her feet down mostly.  Explained this may be why her Lt LE swelled back up.  Lt LE bleeding copiously with dressing removal.  Cleansed well and dressed with silver hydrofiber to help stop bleeding and absorb exudate.  Pt with positive pain behaviors when working on Lt LE.  Continued with Profore system to contain edema.  .   OBJECTIVE IMPAIRMENTS: decreased activity tolerance, difficulty walking, increased edema, and pain.   ACTIVITY LIMITATIONS: dressing, hygiene/grooming, and locomotion level  PARTICIPATION LIMITATIONS: community activity  PERSONAL FACTORS: Time since onset of injury/illness/exacerbation are also affecting patient's functional outcome. Type 2 diabetes mellitus (HCC), Metastatic  adenocarcinoma of the lung,    Essential hypertensiChronic venous stasis edema BLEs - Right greater than left   Class 3 obesity   Atrial fibrillation with RVR (HCC  REHAB POTENTIAL: Good  CLINICAL DECISION MAKING: Stable/uncomplicated  EVALUATION COMPLEXITY: Moderate  PLAN: PT FREQUENCY: 2x/week  PT DURATION: 8 weeks  continue for 8 more weeks   PLANNED INTERVENTIONS: 97110-Therapeutic exercises, 97535- Self Care, 97140- Manual therapy, Patient/Family education, Manual lymph drainage, Compression bandaging, and wound care  to include debridement.    PLAN FOR NEXT SESSION: Continue with debridement and dressing change as indicated.  Complete weekly measurements and photographs.  Resume garment with sock butler when able; if this does not work will need juxtafit.  F/U with pump and Robynn sleeve   Greig KATHEE Fuse, PTA/CLT [facility] Ph: [phone] Montie Metro, PT CLT [phone]   03/27/2023, 11:34 AM  [facility] Medicare Auth Request Information  Date of referral: 01/24/23 Referring provider: Elna Potters Referring diagnosis (ICD 10)? D18.197J, R60 Treatment diagnosis (ICD 10)? (if different than referring diagnosis) M79.662, M79.661, S8102S, S81.801S  Functional Tool Score: N/A  What was this (referring dx) caused by? Other: most likely lymphedema  Nature of Condition: Recurrent (multiple episodes of < 3 months)   Laterality: Both  Current Functional Measure Score: N/A  Objective measurements identify impairments when they are compared to normal values, the uninvolved extremity, and prior level of function.  [x]  Yes  []  No wound size, weeping, edema  Objective assessment of functional ability: difficulty in walking   Briefly describe symptoms: Both leg swelling were down, wounds almost healed.  PT started to wear compression garments.  Slept without the garments and both her legs had blisters B.   How did symptoms start: edma    Average pain intensity:  Last 24 hours: 2  Past week: 4  How often does the pt experience symptoms? Frequently  How much have the symptoms interfered with usual daily activities? A little bit  How has condition changed since care began at this facility? A little better was  a lot better, pt will need night compression   In general, how is the patients overall health? Fair

## 2023-03-28 ENCOUNTER — Encounter: Payer: Self-pay | Admitting: Hematology

## 2023-04-02 NOTE — Addendum Note (Signed)
 Addended by: Bella Kennedy on: 04/02/2023 03:54 PM   Modules accepted: Orders

## 2023-04-03 ENCOUNTER — Ambulatory Visit (HOSPITAL_COMMUNITY): Payer: Medicare Other | Admitting: Physical Therapy

## 2023-04-03 DIAGNOSIS — M79661 Pain in right lower leg: Secondary | ICD-10-CM

## 2023-04-03 DIAGNOSIS — M79605 Pain in left leg: Secondary | ICD-10-CM | POA: Diagnosis not present

## 2023-04-03 DIAGNOSIS — R601 Generalized edema: Secondary | ICD-10-CM

## 2023-04-03 DIAGNOSIS — S81802S Unspecified open wound, left lower leg, sequela: Secondary | ICD-10-CM | POA: Diagnosis not present

## 2023-04-03 DIAGNOSIS — S81801S Unspecified open wound, right lower leg, sequela: Secondary | ICD-10-CM

## 2023-04-03 NOTE — Therapy (Signed)
 OUTPATIENT PHYSICAL THERAPY Wound TREATMENT Patient Name: Denise [name] MRN: [id number] DOB:Oct 24, 1950, 73 y.o., female Today's Date: 04/03/2023   PCP: Leigh Lung, MD REFERRING PROVIDER: Leigh Lung, MD  END OF SESSION:   PT End of Session - 04/03/23 1716     Visit Number 17    Number of Visits 26    Date for PT Re-Evaluation 05/22/23    Authorization Type new insurance as of 2025 [facility]; request submitted and no auth needed    Authorization - Number of Visits 0    Progress Note Due on Visit 26    PT Start Time 1125    PT Stop Time 1225    PT Time Calculation (min) 60 min    Equipment Utilized During Treatment Oxygen     Activity Tolerance Patient tolerated treatment well    Behavior During Therapy WFL for tasks assessed/performed                 Past Medical History:  Diagnosis Date   Anemia    Aortic stenosis    Arthritis    Coronary artery calcification seen on CT scan    Dyspnea    Essential hypertension    GERD (gastroesophageal reflux disease)    History of lung cancer    Stage III adenocarcinoma status post chemoradiation   Port-A-Cath in place 01/06/2019   Type 2 diabetes mellitus (HCC)    Past Surgical History:  Procedure Laterality Date   BIOPSY  09/25/2022   Procedure: BIOPSY;  Surgeon: Cindie Carlin POUR, DO;  [facility];  Service: Endoscopy;;   CHOLECYSTECTOMY  1997   COLONOSCOPY     COLONOSCOPY WITH PROPOFOL  N/A 09/25/2022   Procedure: COLONOSCOPY WITH PROPOFOL ;  Surgeon: Cindie Carlin POUR, DO;  [facility];  Service: Endoscopy;  Laterality: N/A;  11:00;ASA 3   ESOPHAGEAL BRUSHING  09/25/2022   Procedure: ESOPHAGEAL BRUSHING;  Surgeon: Cindie Carlin POUR, DO;  [facility];  Service: Endoscopy;;   ESOPHAGOGASTRODUODENOSCOPY (EGD) WITH PROPOFOL  N/A 09/25/2022   Procedure: ESOPHAGOGASTRODUODENOSCOPY (EGD) WITH PROPOFOL ;  Surgeon: Cindie Carlin POUR, DO;  [facility];  Service: Endoscopy;  Laterality: N/A;   11:00AM;ASA 3   GASTRIC BYPASS     INCISIONAL HERNIA REPAIR  04/11/11   IR IMAGING GUIDED PORT INSERTION  12/27/2018   Right   IR US  GUIDE BX ASP/DRAIN  08/08/2021   LAPAROSCOPIC SALPINGOOPHERECTOMY     LAPAROTOMY  04/11/2011   Procedure: EXPLORATORY LAPAROTOMY;  Surgeon: Debby LABOR. Cornett, MD;  [facility]: [facility];  Service: General;  Laterality: N/A;  closure port hole   POLYPECTOMY  09/25/2022   Procedure: POLYPECTOMY;  Surgeon: Cindie Carlin POUR, DO;  [facility];  Service: Endoscopy;;   RIGHT/LEFT HEART CATH AND CORONARY ANGIOGRAPHY N/A 12/29/2019   Procedure: RIGHT/LEFT HEART CATH AND CORONARY ANGIOGRAPHY;  Surgeon: Burnard Debby LABOR, MD;  [facility];  Service: Cardiovascular;  Laterality: N/A;   TOTAL HIP ARTHROPLASTY  03/07/2012   Procedure: TOTAL HIP ARTHROPLASTY ANTERIOR APPROACH;  Surgeon: Donnice JONETTA Car, MD;  Location: [facility];  Service: Orthopedics;  Laterality: Right;   TOTAL SHOULDER ARTHROPLASTY Left 01/21/2015   TOTAL SHOULDER ARTHROPLASTY Left 01/21/2015   Procedure: LEFT TOTAL SHOULDER ARTHROPLASTY;  Surgeon: Franky Pointer, MD;  [facility];  Service: Orthopedics;  Laterality: Left;   VAGINAL HYSTERECTOMY     Patient Active Problem List   Diagnosis Date Noted   Colitis 01/11/2023   Pneumonia of left lower lobe  due to infectious organism 12/28/2022   Acute respiratory failure with hypoxia (HCC) 12/28/2022   Loculated pleural effusion 12/28/2022   Acute on chronic respiratory failure with hypoxia (HCC) 12/24/2022   Pleural effusion, left 12/24/2022   Multifocal pneumonia 12/24/2022   Atrial fibrillation with RVR (HCC) 12/24/2022   Severe sepsis (HCC) 12/24/2022   Iron deficiency anemia 09/02/2022   Early satiety 09/02/2022   Intramuscular hematoma 08/03/2021   Pulmonary embolus (HCC) 07/17/2021   Type 2 diabetes mellitus (HCC)    Essential hypertension    History of lung cancer    Class 3 obesity    Hypokalemia    Malignant neoplasm  metastatic to brain (HCC) 06/04/2020   Meningioma, cerebral (HCC) 05/18/2020   Abnormal myocardial perfusion study    Aortic stenosis    DOE (dyspnea on exertion) 10/09/2019   Meningioma (HCC) 03/20/2019   Port-A-Cath in place 01/06/2019   Adenocarcinoma of left lung (HCC) 12/09/2018   S/P shoulder replacement 01/21/2015   Expected blood loss anemia 03/08/2012   Morbid obesity with BMI of 45.0-49.9, adult (HCC) 03/08/2012   S/P right THA, AA 03/07/2012   Small bowel obstruction (HCC) 04/11/2011   Incisional hernia with obstruction 04/11/2011   Pelvic mass 03/29/2011   Radicular low back pain 11/03/2010   Hip arthritis 11/03/2010    ONSET DATE: 12/28/22  REFERRING DIAG:    D18.197J (ICD-10-CM) - Unspecified open wound, left lower leg, initial encounter  R60.0 (ICD-10-CM) - Localized edema  C34.92 (ICD-10-CM) - Malignant neoplasm of unspecified part of left bronchus or lung    THERAPY DIAG:  S81.802; open wounds of left LE, Rt LE wounds  Lt LE pain   Wound Therapy - 04/03/23 1717     Subjective pt states she's not feeling well and requests to be pushed back to treatment room today in wheelchair.    Patient and Family Stated Goals wounds to heal    Date of Onset --   chronic   Prior Treatments antibiotic, self care, lasix     Pain Scale 0-10    Pain Score 8     Pain Type Chronic pain    Pain Location Leg    Pain Orientation Right;Left    Pain Descriptors / Indicators Aching;Burning    Evaluation and Treatment Procedures Explained to Patient/Family Yes    Evaluation and Treatment Procedures agreed to    Wound Properties Date First Assessed: 01/15/23 Time First Assessed: 0126 Wound Type: Non-pressure wound Location: Pretibial Location Orientation: Distal;Right Present on Admission: No Final Assessment Date: 04/03/23 Final Assessment Time: 1130   Wound Properties Location: Tibial Location Orientation: Left;Posterior;Proximal Wound Description (Comments): superior posterior Lt  LE   Wound Image Images linked: 1    Dressing Type Compression wrap    Dressing Changed Changed    Dressing Status Old drainage    Dressing Change Frequency PRN    Site / Wound Assessment Painful;Red;Pink;Granulation tissue    % Wound base Red or Granulating 100%    Peri-wound Assessment Edema;Induration    Wound Length (cm) 6.2 cm    Wound Width (cm) 6.6 cm    Wound Surface Area (cm^2) 40.92 cm^2    Drainage Amount Moderate    Drainage Description Serosanguineous    Treatment Cleansed;Debridement (Selective)    Wound Properties Location: Tibial Location Orientation: Distal;Left;Posterior Wound Description (Comments): inferior posterior LT LE wound   Wound Image --   see picture in superior wound   Dressing Type Compression wrap    Dressing Changed Changed  Dressing Status Old drainage    Dressing Change Frequency PRN    % Wound base Red or Granulating 100%    Peri-wound Assessment Edema;Induration    Wound Length (cm) 6.1 cm    Wound Width (cm) 8 cm    Wound Surface Area (cm^2) 48.8 cm^2    Drainage Amount Moderate    Drainage Description Serosanguineous    Treatment Cleansed;Debridement (Selective)    Wound Properties Date First Assessed: 03/05/23 Time First Assessed: 1205 Location: Pretibial Location Orientation: Left;Proximal Wound Description (Comments): Left LE, blistering and weeping   Wound Image Images linked: 1    Dressing Type Compression wrap    Dressing Changed Changed    Dressing Status Old drainage;New drainage    Dressing Change Frequency PRN    Site / Wound Assessment Red;Painful;Granulation tissue;Bleeding    % Wound base Red or Granulating 100%    Peri-wound Assessment Edema;Other (Comment)    Wound Length (cm) 4.7 cm    Wound Width (cm) 6.1 cm    Wound Surface Area (cm^2) 28.67 cm^2    Drainage Amount Moderate    Drainage Description Serosanguineous    Treatment Cleansed;Debridement (Selective)    Selective Debridement (non-excisional) - Location  dried exudate, dry tissue Lt only    Selective Debridement (non-excisional) - Tools Used Other (comment)   gauze   Wound Therapy - Clinical Statement see below    Factors Delaying/Impairing Wound Healing Vascular compromise    Hydrotherapy Plan Debridement;Dressing change    Wound Therapy - Frequency 3X / week    Wound Therapy - Current Recommendations PT    Wound Plan see for debridement, manual and dressing change    Dressing  Rt  vaseline/lotion, silver hydrofiber on dorsal foot and ABD pad and profore lite    Dressing Lt silver hydrofiber, ABD pads, profore lite compression system                PATIENT EDUCATION: Education details: Keep bandage on unless it becomes wet or painful  Person educated: Patient Education method: Explanation Education comprehension: returned demonstration   HOME EXERCISE PROGRAM: Ankle pumps, LAQ, hip ab/adduction, marching    GOALS: Goals reviewed with patient? No  SHORT TERM GOALS: Target date: 02/28/23  Pt wound to be 100% granulated  Baseline: Goal status: on going   2.  PT wounds to no longer be weeping  Baseline:  Goal status: MET  3.  Pt pain to be no greater than a 5/10 Baseline:  Goal status: MET   LONG TERM GOALS: Target date: 03/22/22  PT to have no pain in her  LE's Baseline:  Goal status: MET  2.  PT wound to be healed  Baseline: 03/02/23: Lt LE has healed, continue with Rt LE.   Goal status: IN PROGRESS  3.  Pt to verbalize that she understands the importance of wearing compression garment, completing her exercises  Baseline: 12/123/24:  Pt wearing compression garment with Lt LE. Goal status: MET   ASSESSMENT:  CLINICAL IMPRESSION: Wounds photographed and measured this session. LDA completed for 2 wounds on posterior LT LE as they have not been measured and entered into wound chart.  PT with large area on dorsal Rt foot that was photographed.  Expect this area will open up as wound as well. Discussed again  with patient the importance of getting out of the recliner and using the bed.  PT also admits to having a wound on her bottom.  Requested she contact her MD if  she wishes us  to treat that wound as well.  Educated on how these wounds happed and show she needs to keep her feet elevated.  A los discussed importance of increasing her activity, walking more instead of sitting in her chair all day.  Pt still has not found new shoes as requested at last visit.  Less bleeding today in LT LE, only anterior wound.  Continued with silver hydrofiber as all these wounds were still draining moderately. Placed silver and ABD pad on Rt dorsal foot in anticipation of blister opening fully and more drainage. Used profore lite this session as no heavier profore was available. Pt will need to increase her compliance with self care in order for therapy to help.  .   OBJECTIVE IMPAIRMENTS: decreased activity tolerance, difficulty walking, increased edema, and pain.   ACTIVITY LIMITATIONS: dressing, hygiene/grooming, and locomotion level  PARTICIPATION LIMITATIONS: community activity  PERSONAL FACTORS: Time since onset of injury/illness/exacerbation are also affecting patient's functional outcome. Type 2 diabetes mellitus (HCC), Metastatic adenocarcinoma of the lung,    Essential hypertensiChronic venous stasis edema BLEs - Right greater than left   Class 3 obesity   Atrial fibrillation with RVR (HCC  REHAB POTENTIAL: Good  CLINICAL DECISION MAKING: Stable/uncomplicated  EVALUATION COMPLEXITY: Moderate  PLAN: PT FREQUENCY: 2x/week  PT DURATION: 8 weeks  PLANNED INTERVENTIONS: 97110-Therapeutic exercises, 97535- Self Care, [id number]- Manual therapy, Patient/Family education, Manual lymph drainage, Compression bandaging, and wound care  to include debridement.    PLAN FOR NEXT SESSION: Continue with debridement and dressing change as indicated.  Complete weekly measurements and photographs.  Complete LDA for Rt dorsal  foot and evaluate when opens into wound.    Greig KATHEE Fuse, PTA/CLT [facility] Ph: [phone] Montie Metro, PT CLT [phone]   04/03/2023, 5:29 PM

## 2023-04-05 DIAGNOSIS — T148XXA Other injury of unspecified body region, initial encounter: Secondary | ICD-10-CM | POA: Diagnosis not present

## 2023-04-05 NOTE — Addendum Note (Signed)
Addended by: Bella Kennedy on: 04/05/2023 05:21 PM   Modules accepted: Orders

## 2023-04-06 ENCOUNTER — Other Ambulatory Visit (HOSPITAL_COMMUNITY)
Admission: RE | Admit: 2023-04-06 | Discharge: 2023-04-06 | Disposition: A | Discharge status: Home / Self Care | Payer: Medicare Other | Attending: Family Medicine | Admitting: Family Medicine

## 2023-04-06 ENCOUNTER — Ambulatory Visit (HOSPITAL_COMMUNITY): Payer: Medicare Other

## 2023-04-06 ENCOUNTER — Encounter (HOSPITAL_COMMUNITY): Payer: Self-pay

## 2023-04-06 DIAGNOSIS — R601 Generalized edema: Secondary | ICD-10-CM | POA: Diagnosis not present

## 2023-04-06 DIAGNOSIS — S81801S Unspecified open wound, right lower leg, sequela: Secondary | ICD-10-CM

## 2023-04-06 DIAGNOSIS — M79605 Pain in left leg: Secondary | ICD-10-CM | POA: Diagnosis not present

## 2023-04-06 DIAGNOSIS — S81802A Unspecified open wound, left lower leg, initial encounter: Secondary | ICD-10-CM | POA: Insufficient documentation

## 2023-04-06 DIAGNOSIS — M79661 Pain in right lower leg: Secondary | ICD-10-CM | POA: Diagnosis not present

## 2023-04-06 DIAGNOSIS — S81802S Unspecified open wound, left lower leg, sequela: Secondary | ICD-10-CM | POA: Diagnosis not present

## 2023-04-06 NOTE — Therapy (Signed)
OUTPATIENT PHYSICAL THERAPY Wound TREATMENT Patient Name: Denise [name] MRN: [id number] DOB:[birth year], 73 y.o., female Today's Date: 04/06/2023   PCP: Mirna Mires, MD REFERRING PROVIDER: Mirna Mires, MD  END OF SESSION:   PT End of Session - 04/06/23 1444     Visit Number 18    Number of Visits 26    Date for PT Re-Evaluation 05/22/23    Authorization Type new insurance as of 2025 [facility]; request submitted and no auth needed    PT Start Time 1017    PT Stop Time 1110    PT Time Calculation (min) 53 min                 Past Medical History:  Diagnosis Date   Anemia    Aortic stenosis    Arthritis    Coronary artery calcification seen on CT scan    Dyspnea    Essential hypertension    GERD (gastroesophageal reflux disease)    History of lung cancer    Stage III adenocarcinoma status post chemoradiation   Port-A-Cath in place 01/06/2019   Type 2 diabetes mellitus (HCC)    Past Surgical History:  Procedure Laterality Date   BIOPSY  09/25/2022   Procedure: BIOPSY;  Surgeon: Lanelle Bal, DO;  [facility]: [facility];  Service: Endoscopy;;   CHOLECYSTECTOMY  1997   COLONOSCOPY     COLONOSCOPY WITH PROPOFOL N/A 09/25/2022   Procedure: COLONOSCOPY WITH PROPOFOL;  Surgeon: Lanelle Bal, DO;  [facility]: [facility];  Service: Endoscopy;  Laterality: N/A;  11:00;ASA 3   ESOPHAGEAL BRUSHING  09/25/2022   Procedure: ESOPHAGEAL BRUSHING;  Surgeon: Lanelle Bal, DO;  [facility]: [facility];  Service: Endoscopy;;   ESOPHAGOGASTRODUODENOSCOPY (EGD) WITH PROPOFOL N/A 09/25/2022   Procedure: ESOPHAGOGASTRODUODENOSCOPY (EGD) WITH PROPOFOL;  Surgeon: Lanelle Bal, DO;  [facility];  Service: Endoscopy;  Laterality: N/A;  11:00AM;ASA 3   GASTRIC BYPASS     INCISIONAL HERNIA REPAIR  04/11/11   IR IMAGING GUIDED PORT INSERTION  12/27/2018   Right   IR US GUIDE BX ASP/DRAIN  08/08/2021   LAPAROSCOPIC SALPINGOOPHERECTOMY     LAPAROTOMY  04/11/2011   Procedure:  EXPLORATORY LAPAROTOMY;  Surgeon: Clovis Pu. Cornett, MD;  Location: [facility];  Service: General;  Laterality: N/A;  closure port hole   POLYPECTOMY  09/25/2022   Procedure: POLYPECTOMY;  Surgeon: Lanelle Bal, DO;  [facility];  Service: Endoscopy;;   RIGHT/LEFT HEART CATH AND CORONARY ANGIOGRAPHY N/A 12/29/2019   Procedure: RIGHT/LEFT HEART CATH AND CORONARY ANGIOGRAPHY;  Surgeon: Lennette Bihari, MD;  [facility]: [facility];  Service: Cardiovascular;  Laterality: N/A;   TOTAL HIP ARTHROPLASTY  03/07/2012   Procedure: TOTAL HIP ARTHROPLASTY ANTERIOR APPROACH;  Surgeon: Shelda Pal, MD;  Location: [facility];  Service: Orthopedics;  Laterality: Right;   TOTAL SHOULDER ARTHROPLASTY Left 01/21/2015   TOTAL SHOULDER ARTHROPLASTY Left 01/21/2015   Procedure: LEFT TOTAL SHOULDER ARTHROPLASTY;  Surgeon: Francena Hanly, MD;  Location: [facility];  Service: Orthopedics;  Laterality: Left;   VAGINAL HYSTERECTOMY     Patient Active Problem List   Diagnosis Date Noted   Colitis 01/11/2023   Pneumonia of left lower lobe due to infectious organism 12/28/2022   Acute respiratory failure with hypoxia (HCC) 12/28/2022   Loculated pleural effusion 12/28/2022   Acute on chronic respiratory failure with hypoxia (HCC) 12/24/2022   Pleural effusion, left 12/24/2022   Multifocal pneumonia 12/24/2022   Atrial  fibrillation with RVR (HCC) 12/24/2022   Severe sepsis (HCC) 12/24/2022   Iron deficiency anemia 09/02/2022   Early satiety 09/02/2022   Intramuscular hematoma 08/03/2021   Pulmonary embolus (HCC) 07/17/2021   Type 2 diabetes mellitus (HCC)    Essential hypertension    History of lung cancer    Class 3 obesity    Hypokalemia    Malignant neoplasm metastatic to brain (HCC) 06/04/2020   Meningioma, cerebral (HCC) 05/18/2020   Abnormal myocardial perfusion study    Aortic stenosis    DOE (dyspnea on exertion) 10/09/2019   Meningioma (HCC) 03/20/2019   Port-A-Cath in place 01/06/2019    Adenocarcinoma of left lung (HCC) 12/09/2018   S/P shoulder replacement 01/21/2015   Expected blood loss anemia 03/08/2012   Morbid obesity with BMI of 45.0-49.9, adult (HCC) 03/08/2012   S/P right THA, AA 03/07/2012   Small bowel obstruction (HCC) 04/11/2011   Incisional hernia with obstruction 04/11/2011   Pelvic mass 03/29/2011   Radicular low back pain 11/03/2010   Hip arthritis 11/03/2010    ONSET DATE: 12/28/22  REFERRING DIAG:    Z61.096E (ICD-10-CM) - Unspecified open wound, left lower leg, initial encounter  R60.0 (ICD-10-CM) - Localized edema  C34.92 (ICD-10-CM) - Malignant neoplasm of unspecified part of left bronchus or lung    THERAPY DIAG:  S81.802; open wounds of left LE, Rt LE wounds  Lt LE pain   Wound Therapy - 04/06/23 0001     Subjective Increased difficulty breathing today, requested to be pushed back in wheelchair.    Patient and Family Stated Goals wounds to heal    Date of Onset --   chronic   Prior Treatments antibiotic, self care, lasix    Pain Scale 0-10    Pain Score 10-Worst pain ever   while cleaning and debridement   Pain Type Chronic pain    Pain Location Leg    Pain Orientation Right;Left    Pain Descriptors / Indicators Aching;Burning    Evaluation and Treatment Procedures Explained to Patient/Family Yes    Evaluation and Treatment Procedures agreed to    Wound Properties Location: Tibial Location Orientation: Left;Posterior;Proximal Wound Description (Comments): superior posterior Lt LE   Wound Image Images linked: 1    Dressing Type Silver hydrofiber;Compression wrap;Abdominal pads    Dressing Changed Changed    Dressing Status Old drainage    Dressing Change Frequency PRN    Site / Wound Assessment Painful;Red;Pink;Granulation tissue    % Wound base Red or Granulating 100%    Peri-wound Assessment Edema;Induration    Drainage Amount Moderate    Drainage Description Odor - foul;Green;Purulent    Treatment Cleansed;Debridement  (Selective)    Wound Properties Location: Tibial Location Orientation: Distal;Left;Posterior Wound Description (Comments): inferior posterior LT LE wound   Dressing Type Silver hydrofiber;Abdominal pads;Compression wrap    Dressing Changed Changed    Dressing Status Old drainage    Dressing Change Frequency PRN    % Wound base Red or Granulating 100%    Drainage Amount Moderate    Drainage Description Odor - foul;Purulent;Green    Treatment Cleansed;Debridement (Selective)    Wound Properties Date First Assessed: 03/05/23 Time First Assessed: 1205 Location: Pretibial Location Orientation: Left;Proximal Wound Description (Comments): Left LE, blistering and weeping   Wound Image Images linked: 1    Dressing Type Silver hydrofiber;Abdominal pads;Compression wrap    Dressing Changed Changed    Dressing Status Old drainage;New drainage    Dressing Change Frequency PRN  Site / Wound Assessment Red;Painful;Granulation tissue;Bleeding    % Wound base Red or Granulating 100%    Peri-wound Assessment Edema;Induration    Drainage Amount Moderate    Drainage Description Serosanguineous    Treatment Cleansed;Debridement (Selective)    Selective Debridement (non-excisional) - Location dead skin, dried exudate    Selective Debridement (non-excisional) - Tools Used Forceps    Wound Therapy - Clinical Statement see below    Factors Delaying/Impairing Wound Healing Vascular compromise    Hydrotherapy Plan Debridement;Dressing change    Wound Therapy - Frequency 3X / week    Wound Therapy - Current Recommendations PT    Wound Plan see for debridement, manual and dressing change    Dressing  Rt  vaseline/lotion, silver hydrofiber on dorsal foot and ABD pad, kerlix and coban as profore not availabe    Dressing Lt silver hydrofiber, ABD pads, profore lite compression system                PATIENT EDUCATION: Education details: Keep bandage on unless it becomes wet or painful  Person educated:  Patient Education method: Explanation Education comprehension: returned demonstration   HOME EXERCISE PROGRAM: Ankle pumps, LAQ, hip ab/adduction, marching    GOALS: Goals reviewed with patient? No  SHORT TERM GOALS: Target date: 02/28/23  Pt wound to be 100% granulated  Baseline: Goal status: on going   2.  PT wounds to no longer be weeping  Baseline:  Goal status: MET  3.  Pt pain to be no greater than a 5/10 Baseline:  Goal status: MET   LONG TERM GOALS: Target date: 03/22/22  PT to have no pain in her  LE's Baseline:  Goal status: MET  2.  PT wound to be healed  Baseline: 03/02/23: Lt LE has healed, continue with Rt LE.   Goal status: IN PROGRESS  3.  Pt to verbalize that she understands the importance of wearing compression garment, completing her exercises  Baseline: 12/123/24:  Pt wearing compression garment with Lt LE. Goal status: MET   ASSESSMENT:  CLINICAL IMPRESSION: Wounds photographed and measured this session. LDA completed for 2 wounds on posterior LT LE as they have not been measured and entered into wound chart.  PT with large area on dorsal Rt foot that was photographed.  Expect this area will open up as wound as well. Discussed again with patient the importance of getting out of the recliner and using the bed.  PT also admits to having a wound on her bottom.  Requested she contact her MD if she wishes [place] to treat that wound as well.  Educated on how these wounds happed and show she needs to keep her feet elevated.  A los discussed importance of increasing her activity, walking more instead of sitting in her chair all day.  Pt still has not found new shoes as requested at last visit.  Less bleeding today in LT LE, only anterior wound.  Continued with silver hydrofiber as all these wounds were still draining moderately. Placed silver and ABD pad on Rt dorsal foot in anticipation of blister opening fully and more drainage. Used profore lite this session as  no heavier profore was available. Pt will need to increase her compliance with self care in order for therapy to help.  .   Wound with pseudomonas drainage with odor present.  Rt foot blister has popped on dorsal aspect with significant amount of macerated skin exposing white tissue.  Pt called MD during session concerning  odor and drainage, verbal order to complete culture today with signed fax following session.  Due to decreased compression available pt with wheeping on lateral and posterior aspect of Lt LE.  Selective debridement of removal of slough from wound bed and dead skin.  Continued with silverhydrofiber to address drainage.  Profore not availabe so used profore lite of Lt and kerlix/coban on Rt.  OBJECTIVE IMPAIRMENTS: decreased activity tolerance, difficulty walking, increased edema, and pain.   ACTIVITY LIMITATIONS: dressing, hygiene/grooming, and locomotion level  PARTICIPATION LIMITATIONS: community activity  PERSONAL FACTORS: Time since onset of injury/illness/exacerbation are also affecting patient's functional outcome. Type 2 diabetes mellitus (HCC), Metastatic adenocarcinoma of the lung,    Essential hypertensiChronic venous stasis edema BLEs - Right greater than left   Class 3 obesity   Atrial fibrillation with RVR (HCC  REHAB POTENTIAL: Good  CLINICAL DECISION MAKING: Stable/uncomplicated  EVALUATION COMPLEXITY: Moderate  PLAN: PT FREQUENCY: 2x/week  PT DURATION: 8 weeks  PLANNED INTERVENTIONS: 97110-Therapeutic exercises, 97535- Self Care, 16109- Manual therapy, Patient/Family education, Manual lymph drainage, Compression bandaging, and wound care  to include debridement.    PLAN FOR NEXT SESSION: Continue with debridement and dressing change as indicated.  Complete weekly measurements and photographs.  Complete LDA for Rt dorsal foot and evaluate when opens into wound.    Becky Sax, LPTA/CLT; CBIS [phone]  Juel Burrow, PTA 04/06/2023, 2:46  PM   04/06/2023, 2:46 PM

## 2023-04-09 DIAGNOSIS — E119 Type 2 diabetes mellitus without complications: Secondary | ICD-10-CM | POA: Diagnosis not present

## 2023-04-09 DIAGNOSIS — I10 Essential (primary) hypertension: Secondary | ICD-10-CM | POA: Diagnosis not present

## 2023-04-09 DIAGNOSIS — I2699 Other pulmonary embolism without acute cor pulmonale: Secondary | ICD-10-CM | POA: Diagnosis not present

## 2023-04-09 DIAGNOSIS — T148XXA Other injury of unspecified body region, initial encounter: Secondary | ICD-10-CM | POA: Diagnosis not present

## 2023-04-09 LAB — AEROBIC CULTURE W GRAM STAIN (SUPERFICIAL SPECIMEN): Gram Stain: NONE SEEN

## 2023-04-10 ENCOUNTER — Ambulatory Visit (HOSPITAL_COMMUNITY): Payer: Medicare Other | Admitting: Physical Therapy

## 2023-04-10 DIAGNOSIS — S81801S Unspecified open wound, right lower leg, sequela: Secondary | ICD-10-CM

## 2023-04-10 DIAGNOSIS — M79605 Pain in left leg: Secondary | ICD-10-CM

## 2023-04-10 DIAGNOSIS — M79661 Pain in right lower leg: Secondary | ICD-10-CM

## 2023-04-10 DIAGNOSIS — S81802S Unspecified open wound, left lower leg, sequela: Secondary | ICD-10-CM | POA: Diagnosis not present

## 2023-04-10 DIAGNOSIS — R601 Generalized edema: Secondary | ICD-10-CM | POA: Diagnosis not present

## 2023-04-10 NOTE — Therapy (Signed)
OUTPATIENT PHYSICAL THERAPY Wound TREATMENT Patient Name: Denise [name] MRN: [id number] DOB:1950-03-27, 73 y.o., female Today's Date: 04/10/2023   PCP: Mirna Mires, MD REFERRING PROVIDER: Mirna Mires, MD  END OF SESSION:   PT End of Session - 04/10/23 1720     Visit Number 19    Number of Visits 26    Date for PT Re-Evaluation 05/22/23    Authorization Type new insurance as of 2025 [facility]; request submitted and no auth needed    PT Start Time 1303    PT Stop Time 1350    PT Time Calculation (min) 47 min    Equipment Utilized During Treatment Oxygen    Activity Tolerance Patient tolerated treatment well    Behavior During Therapy [facility] for tasks assessed/performed                  Past Medical History:  Diagnosis Date   Anemia    Aortic stenosis    Arthritis    Coronary artery calcification seen on CT scan    Dyspnea    Essential hypertension    GERD (gastroesophageal reflux disease)    History of lung cancer    Stage III adenocarcinoma status post chemoradiation   Port-A-Cath in place 01/06/2019   Type 2 diabetes mellitus (HCC)    Past Surgical History:  Procedure Laterality Date   BIOPSY  09/25/2022   Procedure: BIOPSY;  Surgeon: Lanelle Bal, DO;  [facility];  Service: Endoscopy;;   CHOLECYSTECTOMY  1997   COLONOSCOPY     COLONOSCOPY WITH PROPOFOL N/A 09/25/2022   Procedure: COLONOSCOPY WITH PROPOFOL;  Surgeon: Lanelle Bal, DO;  [facility];  Service: Endoscopy;  Laterality: N/A;  11:00;ASA 3   ESOPHAGEAL BRUSHING  09/25/2022   Procedure: ESOPHAGEAL BRUSHING;  Surgeon: Lanelle Bal, DO;  [facility];  Service: Endoscopy;;   ESOPHAGOGASTRODUODENOSCOPY (EGD) WITH PROPOFOL N/A 09/25/2022   Procedure: ESOPHAGOGASTRODUODENOSCOPY (EGD) WITH PROPOFOL;  Surgeon: Lanelle Bal, DO;  [facility];  Service: Endoscopy;  Laterality: N/A;  11:00AM;ASA 3   GASTRIC BYPASS     INCISIONAL HERNIA REPAIR  04/11/11   IR  IMAGING GUIDED PORT INSERTION  12/27/2018   Right   IR US GUIDE BX ASP/DRAIN  08/08/2021   LAPAROSCOPIC SALPINGOOPHERECTOMY     LAPAROTOMY  04/11/2011   Procedure: EXPLORATORY LAPAROTOMY;  Surgeon: Clovis Pu. Cornett, MD;  Location: [facility];  Service: General;  Laterality: N/A;  closure port hole   POLYPECTOMY  09/25/2022   Procedure: POLYPECTOMY;  Surgeon: Lanelle Bal, DO;  [facility];  Service: Endoscopy;;   RIGHT/LEFT HEART CATH AND CORONARY ANGIOGRAPHY N/A 12/29/2019   Procedure: RIGHT/LEFT HEART CATH AND CORONARY ANGIOGRAPHY;  Surgeon: Lennette Bihari, MD;  [facility];  Service: Cardiovascular;  Laterality: N/A;   TOTAL HIP ARTHROPLASTY  03/07/2012   Procedure: TOTAL HIP ARTHROPLASTY ANTERIOR APPROACH;  Surgeon: Shelda Pal, MD;  Location: [facility];  Service: Orthopedics;  Laterality: Right;   TOTAL SHOULDER ARTHROPLASTY Left 01/21/2015   TOTAL SHOULDER ARTHROPLASTY Left 01/21/2015   Procedure: LEFT TOTAL SHOULDER ARTHROPLASTY;  Surgeon: Francena Hanly, MD;  Location: [facility];  Service: Orthopedics;  Laterality: Left;   VAGINAL HYSTERECTOMY     Patient Active Problem List   Diagnosis Date Noted   Colitis 01/11/2023   Pneumonia of left lower lobe due to infectious organism 12/28/2022   Acute respiratory failure with hypoxia (HCC) 12/28/2022   Loculated  pleural effusion 12/28/2022   Acute on chronic respiratory failure with hypoxia (HCC) 12/24/2022   Pleural effusion, left 12/24/2022   Multifocal pneumonia 12/24/2022   Atrial fibrillation with RVR (HCC) 12/24/2022   Severe sepsis (HCC) 12/24/2022   Iron deficiency anemia 09/02/2022   Early satiety 09/02/2022   Intramuscular hematoma 08/03/2021   Pulmonary embolus (HCC) 07/17/2021   Type 2 diabetes mellitus (HCC)    Essential hypertension    History of lung cancer    Class 3 obesity    Hypokalemia    Malignant neoplasm metastatic to brain (HCC) 06/04/2020   Meningioma, cerebral (HCC) 05/18/2020    Abnormal myocardial perfusion study    Aortic stenosis    DOE (dyspnea on exertion) 10/09/2019   Meningioma (HCC) 03/20/2019   Port-A-Cath in place 01/06/2019   Adenocarcinoma of left lung (HCC) 12/09/2018   S/P shoulder replacement 01/21/2015   Expected blood loss anemia 03/08/2012   Morbid obesity with BMI of 45.0-49.9, adult (HCC) 03/08/2012   S/P right THA, AA 03/07/2012   Small bowel obstruction (HCC) 04/11/2011   Incisional hernia with obstruction 04/11/2011   Pelvic mass 03/29/2011   Radicular low back pain 11/03/2010   Hip arthritis 11/03/2010    ONSET DATE: 12/28/22  REFERRING DIAG:    Q65.784O (ICD-10-CM) - Unspecified open wound, left lower leg, initial encounter  R60.0 (ICD-10-CM) - Localized edema  C34.92 (ICD-10-CM) - Malignant neoplasm of unspecified part of left bronchus or lung    THERAPY DIAG:  S81.802; open wounds of left LE, Rt LE wounds  Lt LE pain   Wound Therapy - 04/10/23 1716     Subjective pt able to make it to wound room with short seated rest break at half way point.  Reports her legs are itchy but not hurting.    Patient and Family Stated Goals wounds to heal    Date of Onset --   chronic   Prior Treatments antibiotic, self care, lasix    Pain Scale 0-10    Pain Score 0-No pain    Evaluation and Treatment Procedures Explained to Patient/Family Yes    Evaluation and Treatment Procedures agreed to    Wound Properties Date First Assessed: 04/06/23 Time First Assessed: 1020 Wound Type: Other (Comment) Location: Foot Location Orientation: Anterior;Right Wound Description (Comments): blister popped with exposed tissue   Dressing Type Silver hydrofiber;Abdominal pads;Gauze (Comment);Compression wrap   silverhydrofiber, ABD, kerlix, coban, netting   Dressing Changed Changed    Dressing Status Old drainage    Site / Wound Assessment Dusky;Brown;Yellow    % Wound base Red or Granulating 90%    % Wound base Other/Granulation Tissue (Comment) 10%     Margins Attached edges (approximated)    Drainage Amount Scant    Drainage Description Serous    Treatment Cleansed;Debridement (Selective)    Wound Properties Location: Tibial Location Orientation: Left;Posterior;Proximal Wound Description (Comments): superior posterior Lt LE   Dressing Type Silver hydrofiber;Compression wrap;Abdominal pads    Dressing Changed Changed    Dressing Status Old drainage    Dressing Change Frequency PRN    Site / Wound Assessment Painful;Red;Pink;Granulation tissue    % Wound base Red or Granulating 95%    % Wound base Yellow/Fibrinous Exudate 5%    Peri-wound Assessment Edema;Induration    Drainage Amount Moderate    Drainage Description Purulent;Serosanguineous    Treatment Cleansed;Heat applied    Wound Properties Location: Tibial Location Orientation: Distal;Left;Posterior Wound Description (Comments): inferior posterior LT LE wound   Dressing Type  Silver hydrofiber;Abdominal pads;Compression wrap    Dressing Changed Changed    Dressing Status Old drainage    Dressing Change Frequency PRN    % Wound base Red or Granulating 95%    % Wound base Yellow/Fibrinous Exudate 5%    Drainage Amount Moderate    Drainage Description Purulent;Serosanguineous    Treatment Cleansed;Debridement (Selective)    Wound Properties Date First Assessed: 03/05/23 Time First Assessed: 1205 Location: Pretibial Location Orientation: Left;Proximal Wound Description (Comments): Left LE, blistering and weeping   Dressing Type Silver hydrofiber;Abdominal pads;Compression wrap    Dressing Status Old drainage;New drainage    Dressing Change Frequency PRN    Site / Wound Assessment Red;Painful;Granulation tissue;Bleeding    % Wound base Red or Granulating 100%    Peri-wound Assessment Edema;Induration    Drainage Amount Moderate    Drainage Description Purulent;Serosanguineous    Treatment Cleansed;Debridement (Selective)    Selective Debridement (non-excisional) - Location dead  skin, dried exudate, slough    Selective Debridement (non-excisional) - Tools Used Forceps    Wound Therapy - Clinical Statement see below    Factors Delaying/Impairing Wound Healing Vascular compromise    Hydrotherapy Plan Debridement;Dressing change    Wound Therapy - Frequency 3X / week    Wound Therapy - Current Recommendations PT    Wound Plan see for debridement, manual and dressing change    Dressing  Rt  vaseline/lotion, silver hydrofiber on dorsal foot and ABD pad, kerlix and coban as profore not availabe    Dressing Lt silver hydrofiber, ABD pads, profore lite compression system                 PATIENT EDUCATION: Education details: Keep bandage on unless it becomes wet or painful  Person educated: Patient Education method: Explanation Education comprehension: returned demonstration   HOME EXERCISE PROGRAM: Ankle pumps, LAQ, hip ab/adduction, marching    GOALS: Goals reviewed with patient? No  SHORT TERM GOALS: Target date: 02/28/23  Pt wound to be 100% granulated  Baseline: Goal status: on going   2.  PT wounds to no longer be weeping  Baseline:  Goal status: MET  3.  Pt pain to be no greater than a 5/10 Baseline:  Goal status: MET   LONG TERM GOALS: Target date: 03/22/22  PT to have no pain in her  LE's Baseline:  Goal status: MET  2.  PT wound to be healed  Baseline: 03/02/23: Lt LE has healed, continue with Rt LE.   Goal status: IN PROGRESS  3.  Pt to verbalize that she understands the importance of wearing compression garment, completing her exercises  Baseline: 12/123/24:  Pt wearing compression garment with Lt LE. Goal status: MET   ASSESSMENT:  CLINICAL IMPRESSION: Wounds much improved today with Rt dorsal foot nearly healed at this point.  No green/pseudomonas drainage present on Rt LE, however some present on LT but without odor.  Continued with silver Hydrofiber following dbridement and cleansing of LE's/wounds.  Compression used  on bil LE with pt voicing overall comfort.  Admits to return to bed, however is difficult for her to sleep at night.    OBJECTIVE IMPAIRMENTS: decreased activity tolerance, difficulty walking, increased edema, and pain.   ACTIVITY LIMITATIONS: dressing, hygiene/grooming, and locomotion level  PARTICIPATION LIMITATIONS: community activity  PERSONAL FACTORS: Time since onset of injury/illness/exacerbation are also affecting patient's functional outcome. Type 2 diabetes mellitus (HCC), Metastatic adenocarcinoma of the lung,    Essential hypertensiChronic venous stasis edema BLEs - Right  greater than left   Class 3 obesity   Atrial fibrillation with RVR (HCC  REHAB POTENTIAL: Good  CLINICAL DECISION MAKING: Stable/uncomplicated  EVALUATION COMPLEXITY: Moderate  PLAN: PT FREQUENCY: 2x/week  PT DURATION: 8 weeks  PLANNED INTERVENTIONS: 97110-Therapeutic exercises, 97535- Self Care, [id number]- Manual therapy, Patient/Family education, Manual lymph drainage, Compression bandaging, and wound care  to include debridement.    PLAN FOR NEXT SESSION: Continue with debridement and dressing change as indicated.  Complete weekly measurements and photographs.     Emeline Gins B, PTA 04/10/2023, 5:20 PM   04/10/2023, 5:20 PM

## 2023-04-12 ENCOUNTER — Encounter (HOSPITAL_COMMUNITY): Payer: Self-pay | Admitting: Physical Therapy

## 2023-04-12 ENCOUNTER — Ambulatory Visit (HOSPITAL_COMMUNITY): Payer: Medicare Other | Admitting: Physical Therapy

## 2023-04-12 DIAGNOSIS — R601 Generalized edema: Secondary | ICD-10-CM

## 2023-04-12 DIAGNOSIS — S81801S Unspecified open wound, right lower leg, sequela: Secondary | ICD-10-CM | POA: Diagnosis not present

## 2023-04-12 DIAGNOSIS — S81802S Unspecified open wound, left lower leg, sequela: Secondary | ICD-10-CM | POA: Diagnosis not present

## 2023-04-12 DIAGNOSIS — M79605 Pain in left leg: Secondary | ICD-10-CM | POA: Diagnosis not present

## 2023-04-12 DIAGNOSIS — M79661 Pain in right lower leg: Secondary | ICD-10-CM | POA: Diagnosis not present

## 2023-04-12 NOTE — Therapy (Signed)
OUTPATIENT PHYSICAL THERAPY Wound TREATMENT/Progress  Patient Name: Denise [name] MRN: [id number] DOB:1950-11-08, 73 y.o., female Today's Date: 04/12/2023  Progress Note Reporting Period 03/02/23 to 04/12/23  See note below for Objective Data and Assessment of Progress/Goals.  PT wounds were healed, edema controlled.  PT instructed in wearing compression garment.  Pt states she took garments off as instructed  Early January and went to bed.  When pt awoke she had increased edema and blisters on her legs again therefore treatment has continued.   Attempting to get a pump and juxtafit compression as well as night garments so that this does not happen again.    PCP: Mirna Mires, MD REFERRING PROVIDER: Mirna Mires, MD  END OF SESSION:   PT End of Session - 04/12/23 1550     Visit Number 20    Number of Visits 26    Date for PT Re-Evaluation 05/22/23    Authorization Type new insurance as of 2025 [facility]; request submitted and no auth needed    PT Start Time 1150    PT Stop Time 1250    PT Time Calculation (min) 60 min    Equipment Utilized During Treatment Oxygen    Activity Tolerance Patient tolerated treatment well    Behavior During Therapy [facility] for tasks assessed/performed                  Past Medical History:  Diagnosis Date   Anemia    Aortic stenosis    Arthritis    Coronary artery calcification seen on CT scan    Dyspnea    Essential hypertension    GERD (gastroesophageal reflux disease)    History of lung cancer    Stage III adenocarcinoma status post chemoradiation   Port-A-Cath in place 01/06/2019   Type 2 diabetes mellitus (HCC)    Past Surgical History:  Procedure Laterality Date   BIOPSY  09/25/2022   Procedure: BIOPSY;  Surgeon: Lanelle Bal, DO;  Location: [facility];  Service: Endoscopy;;   CHOLECYSTECTOMY  1997   COLONOSCOPY     COLONOSCOPY WITH PROPOFOL N/A 09/25/2022   Procedure: COLONOSCOPY WITH PROPOFOL;  Surgeon: Lanelle Bal, DO;   Location: [facility];  Service: Endoscopy;  Laterality: N/A;  11:00;ASA 3   ESOPHAGEAL BRUSHING  09/25/2022   Procedure: ESOPHAGEAL BRUSHING;  Surgeon: Lanelle Bal, DO;  Location: [facility];  Service: Endoscopy;;   ESOPHAGOGASTRODUODENOSCOPY (EGD) WITH PROPOFOL N/A 09/25/2022   Procedure: ESOPHAGOGASTRODUODENOSCOPY (EGD) WITH PROPOFOL;  Surgeon: Lanelle Bal, DO;  [facility]: [facility];  Service: Endoscopy;  Laterality: N/A;  11:00AM;ASA 3   GASTRIC BYPASS     INCISIONAL HERNIA REPAIR  04/11/11   IR IMAGING GUIDED PORT INSERTION  12/27/2018   Right   IR US GUIDE BX ASP/DRAIN  08/08/2021   LAPAROSCOPIC SALPINGOOPHERECTOMY     LAPAROTOMY  04/11/2011   Procedure: EXPLORATORY LAPAROTOMY;  Surgeon: Clovis Pu. Cornett, MD;  Location: [facility];  Service: General;  Laterality: N/A;  closure port hole   POLYPECTOMY  09/25/2022   Procedure: POLYPECTOMY;  Surgeon: Lanelle Bal, DO;  Location: [facility];  Service: Endoscopy;;   RIGHT/LEFT HEART CATH AND CORONARY ANGIOGRAPHY N/A 12/29/2019   Procedure: RIGHT/LEFT HEART CATH AND CORONARY ANGIOGRAPHY;  Surgeon: Lennette Bihari, MD;  Location: [facility];  Service: Cardiovascular;  Laterality: N/A;   TOTAL HIP ARTHROPLASTY  03/07/2012   Procedure: TOTAL HIP ARTHROPLASTY ANTERIOR APPROACH;  Surgeon: Shelda Pal, MD;  Location: [facility];  Service: Orthopedics;  Laterality: Right;   TOTAL SHOULDER ARTHROPLASTY Left 01/21/2015   TOTAL SHOULDER ARTHROPLASTY Left 01/21/2015   Procedure: LEFT TOTAL SHOULDER ARTHROPLASTY;  Surgeon: Francena Hanly, MD;  Location: [facility];  Service: Orthopedics;  Laterality: Left;   VAGINAL HYSTERECTOMY     Patient Active Problem List   Diagnosis Date Noted   Colitis 01/11/2023   Pneumonia of left lower lobe due to infectious organism 12/28/2022   Acute respiratory failure with hypoxia (HCC) 12/28/2022   Loculated pleural effusion 12/28/2022   Acute on chronic respiratory failure with hypoxia (HCC) 12/24/2022    Pleural effusion, left 12/24/2022   Multifocal pneumonia 12/24/2022   Atrial fibrillation with RVR (HCC) 12/24/2022   Severe sepsis (HCC) 12/24/2022   Iron deficiency anemia 09/02/2022   Early satiety 09/02/2022   Intramuscular hematoma 08/03/2021   Pulmonary embolus (HCC) 07/17/2021   Type 2 diabetes mellitus (HCC)    Essential hypertension    History of lung cancer    Class 3 obesity    Hypokalemia    Malignant neoplasm metastatic to brain (HCC) 06/04/2020   Meningioma, cerebral (HCC) 05/18/2020   Abnormal myocardial perfusion study    Aortic stenosis    DOE (dyspnea on exertion) 10/09/2019   Meningioma (HCC) 03/20/2019   Port-A-Cath in place 01/06/2019   Adenocarcinoma of left lung (HCC) 12/09/2018   S/P shoulder replacement 01/21/2015   Expected blood loss anemia 03/08/2012   Morbid obesity with BMI of 45.0-49.9, adult (HCC) 03/08/2012   S/P right THA, AA 03/07/2012   Small bowel obstruction (HCC) 04/11/2011   Incisional hernia with obstruction 04/11/2011   Pelvic mass 03/29/2011   Radicular low back pain 11/03/2010   Hip arthritis 11/03/2010    ONSET DATE: 12/28/22  REFERRING DIAG:    Z61.096E (ICD-10-CM) - Unspecified open wound, left lower leg, initial encounter  R60.0 (ICD-10-CM) - Localized edema  C34.92 (ICD-10-CM) - Malignant neoplasm of unspecified part of left bronchus or lung    THERAPY DIAG:  S81.802; open wounds of left LE, Rt LE wounds  Lt LE pain   Wound Therapy - 04/12/23 0001     Subjective PT states that she does no have any pain today.    Patient and Family Stated Goals wounds to heal    Date of Onset --   chronic   Prior Treatments antibiotic, self care, lasix    Pain Scale 0-10    Pain Score 0-No pain    Evaluation and Treatment Procedures Explained to Patient/Family Yes    Evaluation and Treatment Procedures agreed to    Wound Properties Date First Assessed: 04/06/23 Time First Assessed: 1020 Wound Type: Other (Comment) Location: Foot  Location Orientation: Anterior;Right Wound Description (Comments): blister popped with exposed tissue   Dressing Type Silver hydrofiber;Abdominal pads;Gauze (Comment);Compression wrap   silverhydrofiber, ABD, kerlix, coban, netting   Dressing Changed Changed    Dressing Status Old drainage;New drainage    Site / Wound Assessment Dusky;Brown;Yellow    % Wound base Red or Granulating 100%    % Wound base Other/Granulation Tissue (Comment) 0%    Wound Length (cm) --   wound has closed, pt has a small opening at base of 3rd toe.  Therapist used xeroform followed by bandaid and coban dressing .   Margins Attached edges (approximated)    Drainage Amount None    Treatment Cleansed;Debridement (Selective)    Wound Properties Location: Tibial Location Orientation: Left;Posterior;Proximal Wound Description (Comments): superior posterior Lt LE  Dressing Type Silver hydrofiber;Compression wrap;Abdominal pads    Dressing Changed Changed    Dressing Status Old drainage    Dressing Change Frequency PRN    Site / Wound Assessment Painful;Red;Pink;Granulation tissue    % Wound base Red or Granulating 100%   following debridement   Peri-wound Assessment Edema;Induration    Wound Length (cm) 4 cm    Wound Width (cm) 3.7 cm    Wound Surface Area (cm^2) 14.8 cm^2    Drainage Amount Minimal    Drainage Description Serous    Treatment Cleansed;Debridement (Selective)    Wound Properties Location: Tibial Location Orientation: Distal;Left;Posterior Wound Description (Comments): inferior posterior LT LE wound   Dressing Type Silver hydrofiber;Abdominal pads;Compression wrap    Dressing Changed Changed    Dressing Status Old drainage    Dressing Change Frequency PRN    % Wound base Red or Granulating 100%    % Wound base Yellow/Fibrinous Exudate 0%    Peri-wound Assessment Edema;Induration    Wound Length (cm) 4.5 cm    Wound Width (cm) 4 cm    Wound Surface Area (cm^2) 18 cm^2    Drainage Amount Minimal     Drainage Description Serous    Treatment Cleansed;Debridement (Selective)    Wound Properties Date First Assessed: 03/05/23 Time First Assessed: 1205 Location: Pretibial Location Orientation: Left;Proximal Wound Description (Comments): Left LE, blistering and weeping   Dressing Type Silver hydrofiber;Abdominal pads;Compression wrap    Dressing Status Old drainage;New drainage    Dressing Change Frequency PRN    Site / Wound Assessment Red;Painful;Granulation tissue;Bleeding    Peri-wound Assessment Edema;Induration    Selective Debridement (non-excisional) - Location devitalized tissue    Selective Debridement (non-excisional) - Tools Used Forceps    Wound Therapy - Clinical Statement see below    Factors Delaying/Impairing Wound Healing Vascular compromise    Hydrotherapy Plan Debridement;Dressing change    Wound Therapy - Frequency 3X / week    Wound Therapy - Current Recommendations PT    Wound Plan see for debridement, manual and dressing change    Dressing  B LE moisturized followed by profore compressin dressing .  Rt LE wound bandaged with xeroform, bandaid and coban.                 PATIENT EDUCATION: Education details: Keep bandage on unless it becomes wet or painful  Person educated: Patient Education method: Explanation Education comprehension: returned demonstration   HOME EXERCISE PROGRAM: Ankle pumps, LAQ, hip ab/adduction, marching    GOALS: Goals reviewed with patient? No  SHORT TERM GOALS: Target date: 02/28/23  Pt wound to be 100% granulated  Baseline: Goal status: on going   2.  PT wounds to no longer be weeping  Baseline:  Goal status: MET  3.  Pt pain to be no greater than a 5/10 Baseline:  Goal status: MET   LONG TERM GOALS: Target date: 03/22/22  PT to have no pain in her  LE's Baseline:  Goal status: MET  2.  PT wound to be healed  Baseline: 03/02/23: Lt LE has healed, continue with Rt LE.   Goal status: IN PROGRESS  3.  Pt to  verbalize that she understands the importance of wearing compression garment, completing her exercises  Baseline: 12/123/24:  Pt wearing compression garment with Lt LE. Goal status: MET   ASSESSMENT:  CLINICAL IMPRESSION: Wounds much improved today with Rt dorsal foot nearly healed at this point.  No green/pseudomonas drainage present on Rt LE, however  some present on LT but without odor.  Continued with silver Hydrofiber following dbridement and cleansing of LE's/wounds.  Compression used on bil LE with pt voicing overall comfort.  Admits to return to bed, however is difficult for her to sleep at night.    OBJECTIVE IMPAIRMENTS: decreased activity tolerance, difficulty walking, increased edema, and pain.   ACTIVITY LIMITATIONS: dressing, hygiene/grooming, and locomotion level  PARTICIPATION LIMITATIONS: community activity  PERSONAL FACTORS: Time since onset of injury/illness/exacerbation are also affecting patient's functional outcome. Type 2 diabetes mellitus (HCC), Metastatic adenocarcinoma of the lung,    Essential hypertensiChronic venous stasis edema BLEs - Right greater than left   Class 3 obesity   Atrial fibrillation with RVR (HCC  REHAB POTENTIAL: Good  CLINICAL DECISION MAKING: Stable/uncomplicated  EVALUATION COMPLEXITY: Moderate  PLAN: PT FREQUENCY: 2x/week  PT DURATION: 8 weeks  PLANNED INTERVENTIONS: 97110-Therapeutic exercises, 97535- Self Care, 19147- Manual therapy, Patient/Family education, Manual lymph drainage, Compression bandaging, and wound care  to include debridement.    PLAN FOR NEXT SESSION: Continue with debridement and dressing change as indicated.  Complete weekly measurements and photographs.    Virgina Organ, PT CLT [phone]  04/12/2023, 4:00 PM   04/12/2023, 4:00 PM

## 2023-04-16 ENCOUNTER — Ambulatory Visit (HOSPITAL_COMMUNITY)
Admission: RE | Admit: 2023-04-16 | Discharge: 2023-04-16 | Disposition: A | Discharge status: Home / Self Care | Payer: Medicare Other | Source: Ambulatory Visit | Attending: Hematology | Admitting: Hematology

## 2023-04-16 ENCOUNTER — Inpatient Hospital Stay: Payer: Medicare Other

## 2023-04-16 ENCOUNTER — Inpatient Hospital Stay: Payer: Medicare Other | Admitting: Hematology

## 2023-04-16 VITALS — Wt 216.9 lb

## 2023-04-16 DIAGNOSIS — R0600 Dyspnea, unspecified: Secondary | ICD-10-CM | POA: Insufficient documentation

## 2023-04-16 DIAGNOSIS — Z96612 Presence of left artificial shoulder joint: Secondary | ICD-10-CM | POA: Diagnosis not present

## 2023-04-16 DIAGNOSIS — C787 Secondary malignant neoplasm of liver and intrahepatic bile duct: Secondary | ICD-10-CM | POA: Diagnosis not present

## 2023-04-16 DIAGNOSIS — C3492 Malignant neoplasm of unspecified part of left bronchus or lung: Secondary | ICD-10-CM

## 2023-04-16 DIAGNOSIS — R0602 Shortness of breath: Secondary | ICD-10-CM | POA: Diagnosis not present

## 2023-04-16 DIAGNOSIS — D329 Benign neoplasm of meninges, unspecified: Secondary | ICD-10-CM | POA: Diagnosis not present

## 2023-04-16 DIAGNOSIS — I700 Atherosclerosis of aorta: Secondary | ICD-10-CM | POA: Diagnosis not present

## 2023-04-16 DIAGNOSIS — I2699 Other pulmonary embolism without acute cor pulmonale: Secondary | ICD-10-CM | POA: Diagnosis not present

## 2023-04-16 DIAGNOSIS — Z452 Encounter for adjustment and management of vascular access device: Secondary | ICD-10-CM | POA: Diagnosis not present

## 2023-04-16 DIAGNOSIS — C3412 Malignant neoplasm of upper lobe, left bronchus or lung: Secondary | ICD-10-CM | POA: Diagnosis not present

## 2023-04-16 DIAGNOSIS — D649 Anemia, unspecified: Secondary | ICD-10-CM | POA: Diagnosis not present

## 2023-04-16 DIAGNOSIS — Z5111 Encounter for antineoplastic chemotherapy: Secondary | ICD-10-CM | POA: Diagnosis not present

## 2023-04-16 DIAGNOSIS — Z7901 Long term (current) use of anticoagulants: Secondary | ICD-10-CM | POA: Diagnosis not present

## 2023-04-16 LAB — CBC WITH DIFFERENTIAL/PLATELET
Abs Immature Granulocytes: 0.02 10*3/uL (ref 0.00–0.07)
Basophils Absolute: 0 10*3/uL (ref 0.0–0.1)
Basophils Relative: 0 %
Eosinophils Absolute: 0.1 10*3/uL (ref 0.0–0.5)
Eosinophils Relative: 1 %
HCT: 29.4 % — ABNORMAL LOW (ref 36.0–46.0)
Hemoglobin: 8.5 g/dL — ABNORMAL LOW (ref 12.0–15.0)
Immature Granulocytes: 0 %
Lymphocytes Relative: 12 %
Lymphs Abs: 1 10*3/uL (ref 0.7–4.0)
MCH: 28.2 pg (ref 26.0–34.0)
MCHC: 28.9 g/dL — ABNORMAL LOW (ref 30.0–36.0)
MCV: 97.7 fL (ref 80.0–100.0)
Monocytes Absolute: 0.8 10*3/uL (ref 0.1–1.0)
Monocytes Relative: 10 %
Neutro Abs: 6.1 10*3/uL (ref 1.7–7.7)
Neutrophils Relative %: 77 %
Platelets: 342 10*3/uL (ref 150–400)
RBC: 3.01 MIL/uL — ABNORMAL LOW (ref 3.87–5.11)
RDW: 16.6 % — ABNORMAL HIGH (ref 11.5–15.5)
WBC: 7.9 10*3/uL (ref 4.0–10.5)
nRBC: 0 % (ref 0.0–0.2)

## 2023-04-16 LAB — COMPREHENSIVE METABOLIC PANEL
ALT: 11 U/L (ref 0–44)
AST: 18 U/L (ref 15–41)
Albumin: 2.3 g/dL — ABNORMAL LOW (ref 3.5–5.0)
Alkaline Phosphatase: 71 U/L (ref 38–126)
Anion gap: 6 (ref 5–15)
BUN: 9 mg/dL (ref 8–23)
CO2: 27 mmol/L (ref 22–32)
Calcium: 8.5 mg/dL — ABNORMAL LOW (ref 8.9–10.3)
Chloride: 106 mmol/L (ref 98–111)
Creatinine, Ser: 0.81 mg/dL (ref 0.44–1.00)
GFR, Estimated: >60 mL/min (ref >60)
Glucose, Bld: 108 mg/dL — ABNORMAL HIGH (ref 70–99)
Potassium: 3.7 mmol/L (ref 3.5–5.1)
Sodium: 139 mmol/L (ref 135–145)
Total Bilirubin: 0.5 mg/dL (ref 0.0–1.2)
Total Protein: 5.8 g/dL — ABNORMAL LOW (ref 6.5–8.1)

## 2023-04-16 LAB — MAGNESIUM: Magnesium: 1.4 mg/dL — ABNORMAL LOW (ref 1.7–2.4)

## 2023-04-16 MED ORDER — MAGNESIUM OXIDE -MG SUPPLEMENT 400 (240 MG) MG PO TABS: 400.0000 mg | ORAL_TABLET | Freq: Two times a day (BID) | ORAL | 4 refills | Status: DC

## 2023-04-16 MED ORDER — SODIUM CHLORIDE 0.9% FLUSH
10.0000 mL | Freq: Once | INTRAVENOUS | Status: AC
  Administered 2023-04-16: 10 mL via INTRAVENOUS

## 2023-04-16 MED ORDER — HEPARIN SOD (PORK) LOCK FLUSH 100 UNIT/ML IV SOLN
500.0000 [IU] | Freq: Once | INTRAVENOUS | Status: AC
  Administered 2023-04-16: 500 [IU] via INTRAVENOUS

## 2023-04-16 MED ORDER — SODIUM CHLORIDE 0.9 % IV SOLN: INTRAVENOUS | Status: DC

## 2023-04-16 MED ORDER — MAGNESIUM SULFATE 4 GM/100ML IV SOLN
4.0000 g | Freq: Once | INTRAVENOUS | Status: DC
  Filled 2023-04-16: qty 100

## 2023-04-16 MED ORDER — MAGNESIUM SULFATE 4 GM/100ML IV SOLN
4.0000 g | Freq: Once | INTRAVENOUS | Status: AC
  Administered 2023-04-16: 4 g via INTRAVENOUS
  Filled 2023-04-16: qty 100

## 2023-04-16 NOTE — Addendum Note (Signed)
Addended by: Pryor Ochoa E on: 04/16/2023 01:53 PM   Modules accepted: Orders

## 2023-04-16 NOTE — Progress Notes (Signed)
[facility] [address], [place] [address]    Clinic Day:  04/16/23   Referring physician: Mirna Mires, MD  Patient Care Team: Mirna Mires, MD as PCP - General (Family Medicine) Jonelle Sidle, MD as PCP - Cardiology (Cardiology) Doreatha Massed, MD as Medical Oncologist (Medical Oncology)   ASSESSMENT & PLAN:   Assessment: 1.  Adenocarcinoma of left lung (HCC) -Chemoradiation therapy with carboplatin and paclitaxel from 01/07/2019 through 02/11/2019. -Consolidation immunotherapy with durvalumab from 03/19/2019 through 04/15/2018, held due to pneumonitis. -CT chest on 07/02/2019 shows left upper lobe lung mass measuring 2.6 x 2.0 cm.  It shows improvement in size. -MRI of the brain on 07/18/2019 showed left sphenoid wing meningioma measuring 2.6 x 2.0 x 2.6 cm unchanged.  No new enhancing intracranial lesion.  Increased left temporal white matter edema. -Durvalumab restarted on 07/24/2019. -She was evaluated by cardiology with a stress test.  EF was 46%.  She underwent cardiac catheterization which did not show any abnormalities. - 1 year of durvalumab completed on 07/23/2020. - Liver mass biopsy (08/08/2021): Adenocarcinoma, CK7 positive, negative for TTF-1, Napsin a, GATA3, ER, CK20, CDX2 - PET scan (08/25/2021): Right upper lobe lung nodule 9 mm, SUV 5.0.  Groundglass and solid nodule periphery of the right upper lobe 8 mm, SUV 2.45.  Right lobe liver mass 4.5 x 3.6 cm, SUV 7.78. - NGS testing: PD-L1 negative, TMB-low, MSI-stable, K-ras G12 D.  No other targetable mutations. - Carboplatin, pemetrexed and pembrolizumab 4 cycles from 09/22/2021 through 11/25/2021, followed by maintenance pemetrexed and pembrolizumab.  Last pemetrexed and pembrolizumab on 11/30/2022.  Pembrolizumab discontinued due to colitis. -  CT CAP (11/10/2021): Reduced size of liver mass and interval resolution of previously seen groundglass pulmonary nodules.  There is a new inflammatory right  upper lobe nodule.    Plan: 1.  Metastatic adenocarcinoma of the lung to the liver and right lung: - CT chest on 02/20/2023: Decreased areas of perihilar parenchymal groundglass opacities and reticular changes compared to CT from October 2024.  Left effusion also improved slightly. - Last pemetrexed was on 03/26/2023. - She reported decreased energy, decreased appetite and dyspnea on exertion for the last 1 week.  She also had blood on tissue paper 1 day last week. - I have obtained chest x-ray today.  There is slight worsening of the pleural effusion. - Will give her prednisone 40 mg daily x 5 days. - Will arrange for left thoracentesis. - We will hold her treatment today.  Will reevaluate her in 2 weeks for possible treatment.    2.  Meningioma: - MRI of the brain on 10/02/2022: Stable left sphenoid wing meningioma.  3.  Unprovoked pulmonary embolism: - Continue Eliquis 5 mg twice daily indefinitely.   4.  Leg swellings: - Continue Lasix 20 mg daily as needed.   5.  Normocytic anemia: - Colonoscopy in July 2024 with nonbleeding internal hemorrhoids, diverticulosis and 2 mm polyp in the transverse colon. - Hemoglobin today is 8.5.  Will closely monitor.  6.  Hypomagnesemia: - She reports that she is not taking magnesium.  Magnesium is low at 1.4.  Will give IV magnesium.  Will start her on magnesium twice daily.    Orders Placed This Encounter  Procedures   DG Chest 2 View    Standing Status:   Future    Number of Occurrences:   1    Expected Date:   04/16/2023    Expiration Date:   04/15/2024  Reason for Exam (SYMPTOM  OR DIAGNOSIS REQUIRED):   shortness of breath    Preferred imaging location?:   [facility]      I,Helena R Teague,acting as a scribe for Doreatha Massed, MD.,have documented all relevant documentation on the behalf of Doreatha Massed, MD,as directed by  Doreatha Massed, MD while in the presence of Doreatha Massed, MD.  I, Doreatha Massed MD, have reviewed the above documentation for accuracy and completeness, and I agree with the above.       Doreatha Massed, MD   1/27/20255:34 PM  CHIEF COMPLAINT:   Diagnosis: stage IV adenocarcinoma of the left lung, negative for targetable mutations    Cancer Staging  Adenocarcinoma of left lung [facility]) Staging form: Lung, AJCC 8th Edition - Clinical stage from 01/02/2019: Stage IIIB (cT3, cN2, cM0) - Signed by Doreatha Massed, MD on 01/02/2019 - Pathologic stage from 09/07/2021: Stage IVA (pTX, pNX, pM1b) - Unsigned    Prior Therapy: 1. Chemoradiation with carboplatin and paclitaxel from 01/07/2019 to 02/11/2019. 2. Consolidation with durvalumab from 03/19/2019 to 04/16/2019, held due to pneumonitis.  Current Therapy:  Pemetrexed and pembrolizumab maintenance    HISTORY OF PRESENT ILLNESS:   Oncology History  Adenocarcinoma of left lung (HCC)  12/09/2018 Initial Diagnosis   Adenocarcinoma of left lung (HCC)   01/02/2019 Cancer Staging   Staging form: Lung, AJCC 8th Edition - Clinical stage from 01/02/2019: Stage IIIB (cT3, cN2, cM0) - Signed by Doreatha Massed, MD on 01/02/2019   01/07/2019 - 02/11/2019 Chemotherapy   The patient had palonosetron (ALOXI) injection 0.25 mg, 0.25 mg, Intravenous,  Once, 6 of 6 cycles Administration: 0.25 mg (01/07/2019), 0.25 mg (01/14/2019), 0.25 mg (01/21/2019), 0.25 mg (01/28/2019), 0.25 mg (02/04/2019), 0.25 mg (02/11/2019) CARBOplatin (PARAPLATIN) 270 mg in sodium chloride 0.9 % 250 mL chemo infusion, 270 mg (100 % of original dose 266.4 mg), Intravenous,  Once, 6 of 6 cycles Dose modification:   (original dose 266.4 mg, Cycle 1),   (original dose 266.4 mg, Cycle 2),   (original dose 266.4 mg, Cycle 3),   (original dose 266.4 mg, Cycle 4) Administration: 270 mg (01/07/2019), 270 mg (01/14/2019), 270 mg (01/21/2019), 270 mg (01/28/2019), 270 mg (02/04/2019), 270 mg (02/11/2019) PACLitaxel (TAXOL) 108 mg in sodium  chloride 0.9 % 250 mL chemo infusion (</= 80mg /m2), 45 mg/m2 = 108 mg, Intravenous,  Once, 6 of 6 cycles Administration: 108 mg (01/07/2019), 108 mg (01/14/2019), 108 mg (01/21/2019), 108 mg (01/28/2019), 108 mg (02/04/2019), 108 mg (02/11/2019) fosaprepitant (EMEND) 150 mg, dexamethasone (DECADRON) 12 mg in sodium chloride 0.9 % 145 mL IVPB, , Intravenous,  Once, 5 of 5 cycles Administration:  (01/14/2019),  (01/21/2019),  (01/28/2019),  (02/04/2019),  (02/11/2019)  for chemotherapy treatment.    03/19/2019 - 07/23/2020 Chemotherapy   Patient is on Treatment Plan : LUNG DURVALUMAB Q14D     09/22/2021 - 11/04/2021 Chemotherapy   Patient is on Treatment Plan : LUNG Carboplatin (5) + Pemetrexed (500) + Pembrolizumab (200) D1 q21d Induction x 4 cycles / Maintenance Pemetrexed (500) + Pembrolizumab (200) D1 q21d     09/22/2021 -  Chemotherapy   Patient is on Treatment Plan : LUNG Carboplatin (5) + Pemetrexed (500) + Pembrolizumab (200) D1 q21d Induction x 4 cycles / Maintenance Pemetrexed (500) + Pembrolizumab (200) D1 q21d        INTERVAL HISTORY:   Denise [name] is a 73 y.o. female presenting to clinic today for follow up of stage IV adenocarcinoma of the left lung. She was  last seen by me on 03/01/23.  Since her last visit, she underwent CT chest on 02/20/23 that found: decreasing areas of perihilar parenchymal ground-glass opacities and reticular changes compared to the CT scan of October 2024; decreasing left effusion slightly with improved components of air from the prior; stable areas of lung nodularity and left lung consolidation; no developing new mass lesion, lymph node enlargement; stable cardiac enlargement with a small pericardial effusion; and persistent right hepatic lobe mass lesion at the edge of the imaging field.   Today, she states that she is doing well overall. Her appetite level is at 25%. Her energy level is at 25%.   PAST MEDICAL HISTORY:   Past Medical History: Past Medical History:   Diagnosis Date   Anemia    Aortic stenosis    Arthritis    Coronary artery calcification seen on CT scan    Dyspnea    Essential hypertension    GERD (gastroesophageal reflux disease)    History of lung cancer    Stage III adenocarcinoma status post chemoradiation   Port-A-Cath in place 01/06/2019   Type 2 diabetes mellitus [facility])     Surgical History: Past Surgical History:  Procedure Laterality Date   BIOPSY  09/25/2022   Procedure: BIOPSY;  Surgeon: Lanelle Bal, DO;  [facility]: [facility];  Service: Endoscopy;;   CHOLECYSTECTOMY  1997   COLONOSCOPY     COLONOSCOPY WITH PROPOFOL N/A 09/25/2022   Procedure: COLONOSCOPY WITH PROPOFOL;  Surgeon: Lanelle Bal, DO;  Location: [facility];  Service: Endoscopy;  Laterality: N/A;  11:00;ASA 3   ESOPHAGEAL BRUSHING  09/25/2022   Procedure: ESOPHAGEAL BRUSHING;  Surgeon: Lanelle Bal, DO;  Location: [facility];  Service: Endoscopy;;   ESOPHAGOGASTRODUODENOSCOPY (EGD) WITH PROPOFOL N/A 09/25/2022   Procedure: ESOPHAGOGASTRODUODENOSCOPY (EGD) WITH PROPOFOL;  Surgeon: Lanelle Bal, DO;  Location: [facility];  Service: Endoscopy;  Laterality: N/A;  11:00AM;ASA 3   GASTRIC BYPASS     INCISIONAL HERNIA REPAIR  04/11/11   IR IMAGING GUIDED PORT INSERTION  12/27/2018   Right   IR US GUIDE BX ASP/DRAIN  08/08/2021   LAPAROSCOPIC SALPINGOOPHERECTOMY     LAPAROTOMY  04/11/2011   Procedure: EXPLORATORY LAPAROTOMY;  Surgeon: Clovis Pu. Cornett, MD;  Location: [facility];  Service: General;  Laterality: N/A;  closure port hole   POLYPECTOMY  09/25/2022   Procedure: POLYPECTOMY;  Surgeon: Lanelle Bal, DO;  Location: [facility];  Service: Endoscopy;;   RIGHT/LEFT HEART CATH AND CORONARY ANGIOGRAPHY N/A 12/29/2019   Procedure: RIGHT/LEFT HEART CATH AND CORONARY ANGIOGRAPHY;  Surgeon: Lennette Bihari, MD;  Location: [facility];  Service: Cardiovascular;  Laterality: N/A;   TOTAL HIP ARTHROPLASTY  03/07/2012   Procedure:  TOTAL HIP ARTHROPLASTY ANTERIOR APPROACH;  Surgeon: Shelda Pal, MD;  Location: [facility];  Service: Orthopedics;  Laterality: Right;   TOTAL SHOULDER ARTHROPLASTY Left 01/21/2015   TOTAL SHOULDER ARTHROPLASTY Left 01/21/2015   Procedure: LEFT TOTAL SHOULDER ARTHROPLASTY;  Surgeon: Francena Hanly, MD;  Location: [facility];  Service: Orthopedics;  Laterality: Left;   VAGINAL HYSTERECTOMY      Social History: Social History   Socioeconomic History   Marital status: Single    Spouse name: Not on file   Number of children: Not on file   Years of education: 12th grade   Highest education level: Not on file  Occupational History   Occupation: Employed    Employer: [employer]  Tobacco Use   Smoking status: Former    Current packs/day: 0.00    Average packs/day: 1 pack/day for 12.0 years (12.0 ttl pk-yrs)    Types: Cigarettes    Start date: 03/20/1964    Quit date: 03/20/1976    Years since quitting: 47.1    Passive exposure: Past   Smokeless tobacco: Never  Vaping Use   Vaping status: Never Used  Substance and Sexual Activity   Alcohol use: No   Drug use: No   Sexual activity: Not Currently  Other Topics Concern   Not on file  Social History Narrative   Not on file   Social Drivers of Health   Financial Resource Strain: Low Risk  (12/06/2018)   Overall Financial Resource Strain (CARDIA)    Difficulty of Paying Living Expenses: Not very hard  Food Insecurity: No Food Insecurity (01/11/2023)   Hunger Vital Sign    Worried About Running Out of Food in the Last Year: Never true    Ran Out of Food in the Last Year: Never true  Transportation Needs: No Transportation Needs (01/11/2023)   PRAPARE - Administrator, Civil Service (Medical): No    Lack of Transportation (Non-Medical): No  Physical Activity: Inactive (12/06/2018)   Exercise Vital Sign    Days of Exercise per Week: 0 days    Minutes of Exercise per Session: 0 min  Stress: No Stress Concern Present  (12/09/2018)   Received from [facility], [facility] of Occupational Health - Occupational Stress Questionnaire    Feeling of Stress : Not at all  Social Connections: Moderately Isolated (12/06/2018)   Social Connection and Isolation Panel [NHANES]    Frequency of Communication with Friends and Family: More than three times a week    Frequency of Social Gatherings with Friends and Family: Once a week    Attends Religious Services: More than 4 times per year    Active Member of [employer] or Organizations: No    Attends Banker Meetings: Never    Marital Status: Never married  Intimate Partner Violence: Not At Risk (01/11/2023)   Humiliation, Afraid, Rape, and Kick questionnaire    Fear of Current or Ex-Partner: No    Emotionally Abused: No    Physically Abused: No    Sexually Abused: No    Family History: Family History  Problem Relation Age of Onset   Breast cancer Mother    COPD Mother    Arthritis Mother    Diabetes Mother    Hypertension Mother    Hypertension Father    Diabetes Father    Breast cancer Sister    Thyroid cancer Brother    Heart attack Brother    Huntington's disease Maternal Grandmother     Current Medications:  Current Outpatient Medications:    acetaminophen (TYLENOL) 500 MG tablet, Take 500 mg by mouth every 6 (six) hours as needed for moderate pain., Disp: , Rfl:    albuterol (VENTOLIN HFA) 108 (90 Base) MCG/ACT inhaler, Inhale 2 puffs into the lungs every 6 (six) hours as needed for wheezing or shortness of breath., Disp: 6.7 g, Rfl: 0   Calcium Carbonate Antacid (TUMS PO), Take 4 tablets by mouth daily., Disp: , Rfl:    carvedilol (COREG) 3.125 MG tablet, Take 1 tablet (3.125 mg total) by mouth 2 (two) times daily., Disp: 180 tablet, Rfl: 3   Cyanocobalamin (VITAMIN B-12) 2500 MCG SUBL, Place 2,500 mcg under the  tongue every morning. , Disp: , Rfl:    ELIQUIS 5 MG TABS tablet, TAKE 1 TABLET BY MOUTH TWICE A DAY,  Disp: 60 tablet, Rfl: 6   ferrous sulfate 325 (65 FE) MG tablet, Take 325 mg by mouth every evening., Disp: , Rfl:    folic acid (FOLVITE) 1 MG tablet, TAKE 1 TABLET BY MOUTH EVERY DAY, Disp: 90 tablet, Rfl: 1   gabapentin (NEURONTIN) 300 MG capsule, Take 300 mg by mouth daily., Disp: , Rfl:    gabapentin (NEURONTIN) 600 MG tablet, Take 600 mg by mouth at bedtime., Disp: , Rfl:    Glycerin-Hypromellose-PEG 400 (DRY EYE RELIEF DROPS OP), Apply 1 drop to eye daily as needed (dry eyes)., Disp: , Rfl:    ibuprofen (ADVIL) 800 MG tablet, Take 1 tablet (800 mg total) by mouth every 8 (eight) hours as needed., Disp: 9 tablet, Rfl: 0   magnesium oxide (MAG-OX) 400 (240 Mg) MG tablet, Take 1 tablet (400 mg total) by mouth 2 (two) times daily., Disp: 60 tablet, Rfl: 4   midodrine (PROAMATINE) 5 MG tablet, Take 1 tablet (5 mg total) by mouth 3 (three) times daily with meals., Disp: 90 tablet, Rfl: 5   Multiple Vitamin (MULITIVITAMIN WITH MINERALS) TABS, Take 1 tablet by mouth daily., Disp: , Rfl:    omeprazole (PRILOSEC) 20 MG capsule, Take 20 mg by mouth daily., Disp: , Rfl:    Potassium Chloride ER 20 MEQ TBCR, Take 2 tablets by mouth daily., Disp: , Rfl:  No current facility-administered medications for this visit.  Facility-Administered Medications Ordered in Other Visits:    magnesium sulfate 2 GM/50ML IVPB, , , ,    Allergies: No Known Allergies  REVIEW OF SYSTEMS:   Review of Systems  Constitutional:  Negative for chills, fatigue and fever.  HENT:   Negative for lump/mass, mouth sores, nosebleeds, sore throat and trouble swallowing.   Eyes:  Negative for eye problems.  Respiratory:  Positive for shortness of breath. Negative for cough.   Cardiovascular:  Negative for chest pain, leg swelling and palpitations.  Gastrointestinal:  Negative for abdominal pain, constipation, diarrhea, nausea and vomiting.  Genitourinary:  Negative for bladder incontinence, difficulty urinating, dysuria,  frequency, hematuria and nocturia.   Musculoskeletal:  Negative for arthralgias, back pain, flank pain, myalgias and neck pain.  Skin:  Negative for itching and rash.  Neurological:  Positive for numbness. Negative for dizziness and headaches.  Hematological:  Does not bruise/bleed easily.  Psychiatric/Behavioral:  Negative for depression, sleep disturbance and suicidal ideas. The patient is not nervous/anxious.   All other systems reviewed and are negative.    VITALS:   Weight 216 lb 14.9 oz (98.4 kg).  Wt Readings from Last 3 Encounters:  04/16/23 216 lb 14.9 oz (98.4 kg)  03/26/23 221 lb 12.5 oz (100.6 kg)  03/01/23 218 lb 0.6 oz (98.9 kg)    Body mass index is 39.68 kg/m.  Performance status (ECOG): 1 - Symptomatic but completely ambulatory  PHYSICAL EXAM:   Physical Exam Vitals and nursing note reviewed. Exam conducted with a chaperone present.  Constitutional:      Appearance: Normal appearance.  Cardiovascular:     Rate and Rhythm: Normal rate and regular rhythm.     Pulses: Normal pulses.     Heart sounds: Normal heart sounds.  Pulmonary:     Effort: Pulmonary effort is normal.     Breath sounds: Normal breath sounds.  Abdominal:     Palpations: Abdomen is soft. There  is no hepatomegaly, splenomegaly or mass.     Tenderness: There is no abdominal tenderness.  Musculoskeletal:     Right lower leg: No edema.     Left lower leg: No edema.  Lymphadenopathy:     Cervical: No cervical adenopathy.     Right cervical: No superficial, deep or posterior cervical adenopathy.    Left cervical: No superficial, deep or posterior cervical adenopathy.     Upper Body:     Right upper body: No supraclavicular or axillary adenopathy.     Left upper body: No supraclavicular or axillary adenopathy.  Neurological:     General: No focal deficit present.     Mental Status: She is alert and oriented to person, place, and time.  Psychiatric:        Mood and Affect: Mood normal.         Behavior: Behavior normal.     LABS:      Latest Ref Rng & Units 04/16/2023   12:00 PM 03/26/2023   11:54 AM 03/01/2023    8:43 AM  CBC  WBC 4.0 - 10.5 K/uL 7.9  6.8  4.5   Hemoglobin 12.0 - 15.0 g/dL 8.5  9.4  96.2   Hematocrit 36.0 - 46.0 % 29.4  32.9  34.8   Platelets 150 - 400 K/uL 342  285  213       Latest Ref Rng & Units 04/16/2023   12:00 PM 03/26/2023   11:54 AM 03/01/2023    8:43 AM  CMP  Glucose 70 - 99 mg/dL 952  841  324   BUN 8 - 23 mg/dL 9  11  11    Creatinine 0.44 - 1.00 mg/dL 4.01  0.27  2.53   Sodium 135 - 145 mmol/L 139  142  145   Potassium 3.5 - 5.1 mmol/L 3.7  3.3  3.4   Chloride 98 - 111 mmol/L 106  106  109   CO2 22 - 32 mmol/L 27  29  27    Calcium 8.9 - 10.3 mg/dL 8.5  8.5  8.8   Total Protein 6.5 - 8.1 g/dL 5.8  5.8  5.7   Total Bilirubin 0.0 - 1.2 mg/dL 0.5  <6.6  0.5   Alkaline Phos 38 - 126 U/L 71  72  65   AST 15 - 41 U/L 18  19  21    ALT 0 - 44 U/L 11  13  24       No results found for: "CEA1", "CEA" / No results found for: "CEA1", "CEA" No results found for: "PSA1" No results found for: "YQI347" No results found for: "CAN125"  No results found for: "TOTALPROTELP", "ALBUMINELP", "A1GS", "A2GS", "BETS", "BETA2SER", "GAMS", "MSPIKE", "SPEI" Lab Results  Component Value Date   TIBC 241 (L) 12/20/2022   TIBC 215 (L) 09/18/2022   TIBC 227 (L) 06/05/2022   FERRITIN 715 (H) 12/20/2022   FERRITIN 596 (H) 09/18/2022   FERRITIN 310 (H) 06/05/2022   IRONPCTSAT 12 12/20/2022   IRONPCTSAT 15 09/18/2022   IRONPCTSAT 17 06/05/2022   Lab Results  Component Value Date   LDH 178 12/29/2022   LDH 185 12/25/2022   LDH 237 (H) 07/17/2022     STUDIES:   No results found.

## 2023-04-16 NOTE — Patient Instructions (Addendum)
[facility] at [facility] Discharge Instructions   You were seen and examined today by Dr. Ellin Saba.  He reviewed the results of your lab work which are mostly normal/stable. Your hemoglobin was 8.5. Your magnesium is very low at 1.4. We will give you IV magnesium today.   We will hold your treatment today.   Return as scheduled.    Thank you for choosing [facility] at [facility] to provide your oncology and hematology care.  To afford each patient quality time with our provider, please arrive at least 15 minutes before your scheduled appointment time.   If you have a lab appointment with the Cancer Center please come in thru the Main Entrance and check in at the main information desk.  You need to re-schedule your appointment should you arrive 10 or more minutes late.  We strive to give you quality time with our providers, and arriving late affects you and other patients whose appointments are after yours.  Also, if you no show three or more times for appointments you may be dismissed from the clinic at the providers discretion.     Again, thank you for choosing [facility].  Our hope is that these requests will decrease the amount of time that you wait before being seen by our physicians.       _____________________________________________________________  Should you have questions after your visit to [facility], please contact our office at [phone] and follow the prompts.  Our office hours are 8:00 a.m. and 4:30 p.m. Monday - Friday.  Please note that voicemails left after 4:00 p.m. may not be returned until the following business day.  We are closed weekends and major holidays.  You do have access to a nurse 24-7, just call the main number to the clinic [phone] and do not press any options, hold on the line and a nurse will answer the phone.    For prescription refill requests, have your pharmacy  contact our office and allow 72 hours.    Due to Covid, you will need to wear a mask upon entering the hospital. If you do not have a mask, a mask will be given to you at the Main Entrance upon arrival. For doctor visits, patients may have 1 support person age 10 or older with them. For treatment visits, patients can not have anyone with them due to social distancing guidelines and our immunocompromised population.

## 2023-04-16 NOTE — Progress Notes (Signed)
Patient presents today for Keytruda/Alimta infusion per providers order.  Vital signs and labs reviewed by MD.  Message received from Chapman Moss RN/Dr. Ellin Saba treatment being held today, patient to receive 4 grams IV magnesium.  Stable during infusion without adverse affects.  Vital signs stable.  No complaints at this time.  Discharge from clinic ambulatory in stable condition.  Alert and oriented X 3.  Follow up with [facility] as scheduled.

## 2023-04-16 NOTE — Patient Instructions (Signed)
[name] - A DEPT OF [facility]  Discharge Instructions: Thank you for choosing [facility] to provide your oncology and hematology care.  If you have a lab appointment with the Cancer Center - please note that after April 8th, 2024, all labs will be drawn in the cancer center.  You do not have to check in or register with the main entrance as you have in the past but will complete your check-in in the cancer center.  Wear comfortable clothing and clothing appropriate for easy access to any Portacath or PICC line.   We strive to give you quality time with your provider. You may need to reschedule your appointment if you arrive late (15 or more minutes).  Arriving late affects you and other patients whose appointments are after yours.  Also, if you miss three or more appointments without notifying the office, you may be dismissed from the clinic at the provider's discretion.      For prescription refill requests, have your pharmacy contact our office and allow 72 hours for refills to be completed.    Today you received the following chemotherapy and/or immunotherapy agents Magnesium      To help prevent nausea and vomiting after your treatment, we encourage you to take your nausea medication as directed.  BELOW ARE SYMPTOMS THAT SHOULD BE REPORTED IMMEDIATELY: *FEVER GREATER THAN 100.4 F (38 C) OR HIGHER *CHILLS OR SWEATING *NAUSEA AND VOMITING THAT IS NOT CONTROLLED WITH YOUR NAUSEA MEDICATION *UNUSUAL SHORTNESS OF BREATH *UNUSUAL BRUISING OR BLEEDING *URINARY PROBLEMS (pain or burning when urinating, or frequent urination) *BOWEL PROBLEMS (unusual diarrhea, constipation, pain near the anus) TENDERNESS IN MOUTH AND THROAT WITH OR WITHOUT PRESENCE OF ULCERS (sore throat, sores in mouth, or a toothache) UNUSUAL RASH, SWELLING OR PAIN  UNUSUAL VAGINAL DISCHARGE OR ITCHING   Items with * indicate a potential emergency and should be followed up  as soon as possible or go to the Emergency Department if any problems should occur.  Please show the CHEMOTHERAPY ALERT CARD or IMMUNOTHERAPY ALERT CARD at check-in to the Emergency Department and triage nurse.  Should you have questions after your visit or need to cancel or reschedule your appointment, please contact [facility] Denise [name] - A DEPT OF [facility] [phone]  and follow the prompts.  Office hours are 8:00 a.m. to 4:30 p.m. Monday - Friday. Please note that voicemails left after 4:00 p.m. may not be returned until the following business day.  We are closed weekends and major holidays. You have access to a nurse at all times for urgent questions. Please call the main number to the clinic [phone] and follow the prompts.  For any non-urgent questions, you may also contact your provider using MyChart. We now offer e-Visits for anyone 41 and older to request care online for non-urgent symptoms. For details visit [email or web address].   Also download the MyChart app! Go to the app store, search "MyChart", open the app, select [place], and log in with your MyChart username and password.

## 2023-04-17 ENCOUNTER — Other Ambulatory Visit: Payer: Self-pay | Admitting: Hematology

## 2023-04-17 ENCOUNTER — Encounter (HOSPITAL_COMMUNITY): Payer: Self-pay

## 2023-04-17 ENCOUNTER — Ambulatory Visit (HOSPITAL_COMMUNITY): Payer: Medicare Other

## 2023-04-17 ENCOUNTER — Other Ambulatory Visit: Payer: Self-pay | Admitting: *Deleted

## 2023-04-17 DIAGNOSIS — S81801S Unspecified open wound, right lower leg, sequela: Secondary | ICD-10-CM

## 2023-04-17 DIAGNOSIS — M79605 Pain in left leg: Secondary | ICD-10-CM

## 2023-04-17 DIAGNOSIS — R601 Generalized edema: Secondary | ICD-10-CM | POA: Diagnosis not present

## 2023-04-17 DIAGNOSIS — I10 Essential (primary) hypertension: Secondary | ICD-10-CM | POA: Diagnosis not present

## 2023-04-17 DIAGNOSIS — E119 Type 2 diabetes mellitus without complications: Secondary | ICD-10-CM | POA: Diagnosis not present

## 2023-04-17 DIAGNOSIS — T148XXA Other injury of unspecified body region, initial encounter: Secondary | ICD-10-CM | POA: Diagnosis not present

## 2023-04-17 DIAGNOSIS — M79661 Pain in right lower leg: Secondary | ICD-10-CM

## 2023-04-17 DIAGNOSIS — S81802S Unspecified open wound, left lower leg, sequela: Secondary | ICD-10-CM | POA: Diagnosis not present

## 2023-04-17 DIAGNOSIS — J90 Pleural effusion, not elsewhere classified: Secondary | ICD-10-CM

## 2023-04-17 DIAGNOSIS — I2699 Other pulmonary embolism without acute cor pulmonale: Secondary | ICD-10-CM | POA: Diagnosis not present

## 2023-04-17 NOTE — Therapy (Signed)
OUTPATIENT PHYSICAL THERAPY Wound TREATMENT Patient Name: Denise [name] MRN: [id number] DOB:06/30/1950, 73 y.o., female Today's Date: 04/17/2023     PCP: Mirna Mires, MD REFERRING PROVIDER: Mirna Mires, MD  END OF SESSION:   PT End of Session - 04/17/23 1234     Visit Number 21    Number of Visits 26    Date for PT Re-Evaluation 05/22/23    Authorization Type new insurance as of 2025 [facility]; request submitted and no auth needed    Authorization Time Period 16 from 01/05-->05/17/23    Authorization - Visit Number 7    Authorization - Number of Visits 16    Progress Note Due on Visit 26    PT Start Time 1102    PT Stop Time 1156    PT Time Calculation (min) 54 min    Equipment Utilized During Treatment Oxygen    Activity Tolerance Patient tolerated treatment well    Behavior During Therapy WFL for tasks assessed/performed                  Past Medical History:  Diagnosis Date   Anemia    Aortic stenosis    Arthritis    Coronary artery calcification seen on CT scan    Dyspnea    Essential hypertension    GERD (gastroesophageal reflux disease)    History of lung cancer    Stage III adenocarcinoma status post chemoradiation   Port-A-Cath in place 01/06/2019   Type 2 diabetes mellitus (HCC)    Past Surgical History:  Procedure Laterality Date   BIOPSY  09/25/2022   Procedure: BIOPSY;  Surgeon: Lanelle Bal, DO;  [facility];  Service: Endoscopy;;   CHOLECYSTECTOMY  1997   COLONOSCOPY     COLONOSCOPY WITH PROPOFOL N/A 09/25/2022   Procedure: COLONOSCOPY WITH PROPOFOL;  Surgeon: Lanelle Bal, DO;  [facility];  Service: Endoscopy;  Laterality: N/A;  11:00;ASA 3   ESOPHAGEAL BRUSHING  09/25/2022   Procedure: ESOPHAGEAL BRUSHING;  Surgeon: Lanelle Bal, DO;  [facility];  Service: Endoscopy;;   ESOPHAGOGASTRODUODENOSCOPY (EGD) WITH PROPOFOL N/A 09/25/2022   Procedure: ESOPHAGOGASTRODUODENOSCOPY (EGD) WITH PROPOFOL;  Surgeon:  Lanelle Bal, DO;  [facility];  Service: Endoscopy;  Laterality: N/A;  11:00AM;ASA 3   GASTRIC BYPASS     INCISIONAL HERNIA REPAIR  04/11/11   IR IMAGING GUIDED PORT INSERTION  12/27/2018   Right   IR US GUIDE BX ASP/DRAIN  08/08/2021   LAPAROSCOPIC SALPINGOOPHERECTOMY     LAPAROTOMY  04/11/2011   Procedure: EXPLORATORY LAPAROTOMY;  Surgeon: Clovis Pu. Cornett, MD;  Location: [facility];  Service: General;  Laterality: N/A;  closure port hole   POLYPECTOMY  09/25/2022   Procedure: POLYPECTOMY;  Surgeon: Lanelle Bal, DO;  Location: [facility];  Service: Endoscopy;;   RIGHT/LEFT HEART CATH AND CORONARY ANGIOGRAPHY N/A 12/29/2019   Procedure: RIGHT/LEFT HEART CATH AND CORONARY ANGIOGRAPHY;  Surgeon: Lennette Bihari, MD;  Location: [facility];  Service: Cardiovascular;  Laterality: N/A;   TOTAL HIP ARTHROPLASTY  03/07/2012   Procedure: TOTAL HIP ARTHROPLASTY ANTERIOR APPROACH;  Surgeon: Shelda Pal, MD;  Location: [facility];  Service: Orthopedics;  Laterality: Right;   TOTAL SHOULDER ARTHROPLASTY Left 01/21/2015   TOTAL SHOULDER ARTHROPLASTY Left 01/21/2015   Procedure: LEFT TOTAL SHOULDER ARTHROPLASTY;  Surgeon: Francena Hanly, MD;  Location: [facility];  Service: Orthopedics;  Laterality: Left;   VAGINAL HYSTERECTOMY  Patient Active Problem List   Diagnosis Date Noted   Colitis 01/11/2023   Pneumonia of left lower lobe due to infectious organism 12/28/2022   Acute respiratory failure with hypoxia (HCC) 12/28/2022   Loculated pleural effusion 12/28/2022   Acute on chronic respiratory failure with hypoxia (HCC) 12/24/2022   Pleural effusion, left 12/24/2022   Multifocal pneumonia 12/24/2022   Atrial fibrillation with RVR (HCC) 12/24/2022   Severe sepsis (HCC) 12/24/2022   Iron deficiency anemia 09/02/2022   Early satiety 09/02/2022   Intramuscular hematoma 08/03/2021   Pulmonary embolus (HCC) 07/17/2021   Type 2 diabetes mellitus (HCC)    Essential hypertension     History of lung cancer    Class 3 obesity    Hypokalemia    Malignant neoplasm metastatic to brain (HCC) 06/04/2020   Meningioma, cerebral (HCC) 05/18/2020   Abnormal myocardial perfusion study    Aortic stenosis    DOE (dyspnea on exertion) 10/09/2019   Meningioma (HCC) 03/20/2019   Port-A-Cath in place 01/06/2019   Adenocarcinoma of left lung (HCC) 12/09/2018   S/P shoulder replacement 01/21/2015   Expected blood loss anemia 03/08/2012   Morbid obesity with BMI of 45.0-49.9, adult (HCC) 03/08/2012   S/P right THA, AA 03/07/2012   Small bowel obstruction (HCC) 04/11/2011   Incisional hernia with obstruction 04/11/2011   Pelvic mass 03/29/2011   Radicular low back pain 11/03/2010   Hip arthritis 11/03/2010    ONSET DATE: 12/28/22  REFERRING DIAG:    W09.811B (ICD-10-CM) - Unspecified open wound, left lower leg, initial encounter  R60.0 (ICD-10-CM) - Localized edema  C34.92 (ICD-10-CM) - Malignant neoplasm of unspecified part of left bronchus or lung    THERAPY DIAG:  S81.802; open wounds of left LE, Rt LE wounds  Lt LE pain   Wound Therapy - 04/17/23 0001     Subjective Pt arrived with dressings intact, no reports of pain today.  Reports she saw MD and has fluid in lungs.  MD plans on sending her to wound doctor in March.    Patient and Family Stated Goals wounds to heal    Date of Onset --   chronic   Prior Treatments antibiotic, self care, lasix    Pain Scale 0-10    Pain Score 0-No pain    Evaluation and Treatment Procedures Explained to Patient/Family Yes    Evaluation and Treatment Procedures agreed to    Wound Properties Date First Assessed: 04/06/23 Time First Assessed: 1020 Wound Type: Other (Comment) Location: Foot Location Orientation: Anterior;Right Wound Description (Comments): blister popped with exposed tissue   Wound Image Images linked: 1    Dressing Type --   compression garment   % Wound base Red or Granulating 100%    Treatment Cleansed   assisted  with donning compression garments   Wound Properties Location: Tibial Location Orientation: Left;Posterior;Proximal Wound Description (Comments): superior posterior Lt LE   Wound Image Images linked: 1    Dressing Type Silver hydrofiber;Compression wrap;Abdominal pads    Dressing Changed Changed    Dressing Status Old drainage    Dressing Change Frequency PRN    Site / Wound Assessment Painful;Red;Pink;Granulation tissue    % Wound base Red or Granulating 100%    % Wound base Yellow/Fibrinous Exudate 0%    Peri-wound Assessment Edema;Induration    Drainage Amount Minimal    Drainage Description Serous    Treatment Cleansed;Debridement (Selective)    Wound Properties Location: Tibial Location Orientation: Distal;Left;Posterior Wound Description (Comments): inferior posterior LT  LE wound   Dressing Type Silver hydrofiber;Abdominal pads;Compression wrap    Dressing Changed Changed    Dressing Status Old drainage    Dressing Change Frequency PRN    % Wound base Red or Granulating 100%    % Wound base Yellow/Fibrinous Exudate 0%    Drainage Amount Minimal    Drainage Description Serous    Treatment Cleansed;Debridement (Selective)    Wound Properties Date First Assessed: 03/05/23 Time First Assessed: 1205 Location: Pretibial Location Orientation: Left;Proximal Wound Description (Comments): Left LE, blistering and weeping   Dressing Type Silver hydrofiber;Abdominal pads;Compression wrap    Dressing Changed Changed    Dressing Status Old drainage;New drainage    Dressing Change Frequency PRN    Site / Wound Assessment Red;Painful;Granulation tissue;Bleeding    % Wound base Red or Granulating 100%    Peri-wound Assessment Edema;Induration    Drainage Amount Minimal    Drainage Description Serous    Treatment Cleansed;Debridement (Selective)    Selective Debridement (non-excisional) - Location devitalized tissue    Selective Debridement (non-excisional) - Tools Used Forceps    Selective  Debridement (non-excisional) - Tissue Removed slough and dead skin    Wound Therapy - Clinical Statement see below    Factors Delaying/Impairing Wound Healing Vascular compromise    Hydrotherapy Plan Debridement;Dressing change    Wound Therapy - Frequency 3X / week    Wound Therapy - Current Recommendations PT    Wound Plan see for debridement, manual and dressing change    Dressing  Rt therapist assisted wiht donning compression garment    Dressing Lt silver hydrofiber and profore                 PATIENT EDUCATION: Education details: Keep bandage on unless it becomes wet or painful  Person educated: Patient Education method: Explanation Education comprehension: returned demonstration   HOME EXERCISE PROGRAM: Ankle pumps, LAQ, hip ab/adduction, marching    GOALS: Goals reviewed with patient? No  SHORT TERM GOALS: Target date: 02/28/23  Pt wound to be 100% granulated  Baseline: Goal status: on going   2.  PT wounds to no longer be weeping  Baseline:  Goal status: MET  3.  Pt pain to be no greater than a 5/10 Baseline:  Goal status: MET   LONG TERM GOALS: Target date: 03/22/22  PT to have no pain in her  LE's Baseline:  Goal status: MET  2.  PT wound to be healed  Baseline: 03/02/23: Lt LE has healed, continue with Rt LE.   Goal status: IN PROGRESS  3.  Pt to verbalize that she understands the importance of wearing compression garment, completing her exercises  Baseline: 12/123/24:  Pt wearing compression garment with Lt LE. Goal status: MET   ASSESSMENT:  CLINICAL IMPRESSION: Rt LE fully healed, therapist assisted donning compression garments due to time.  Pt stated she owns butler at home.  Pt hesitant to resume compression garment as wounds have reblistered without compression on at night in the past.  Pt sits primarily all the time so encouraged to keep dressings on at night, to remove garment, shower and rest for a couple hours to allow lotion to  absorb into skin prior reapplication of garment.  Discussed if she can return to sleeping in bed, to remove at night and wear during day.  Selective debridement for removal of slough and dead skin to promote healing.  COntinued with silverhydrofiber and profore for edema control.    OBJECTIVE IMPAIRMENTS: decreased activity  tolerance, difficulty walking, increased edema, and pain.   ACTIVITY LIMITATIONS: dressing, hygiene/grooming, and locomotion level  PARTICIPATION LIMITATIONS: community activity  PERSONAL FACTORS: Time since onset of injury/illness/exacerbation are also affecting patient's functional outcome. Type 2 diabetes mellitus (HCC), Metastatic adenocarcinoma of the lung,    Essential hypertensiChronic venous stasis edema BLEs - Right greater than left   Class 3 obesity   Atrial fibrillation with RVR (HCC  REHAB POTENTIAL: Good  CLINICAL DECISION MAKING: Stable/uncomplicated  EVALUATION COMPLEXITY: Moderate  PLAN: PT FREQUENCY: 2x/week  PT DURATION: 8 weeks  PLANNED INTERVENTIONS: 97110-Therapeutic exercises, 97535- Self Care, 16109- Manual therapy, Patient/Family education, Manual lymph drainage, Compression bandaging, and wound care  to include debridement.    PLAN FOR NEXT SESSION: Continue with debridement and dressing change as indicated.  Complete weekly measurements and photographs.     Becky Sax, LPTA/CLT; [facility] [phone]  04/17/2023, 4:47 PM   04/17/2023, 4:47 PM

## 2023-04-18 ENCOUNTER — Encounter (HOSPITAL_COMMUNITY): Payer: Self-pay

## 2023-04-18 ENCOUNTER — Other Ambulatory Visit: Payer: Self-pay

## 2023-04-18 ENCOUNTER — Emergency Department (HOSPITAL_COMMUNITY)
Admission: EM | Admit: 2023-04-18 | Discharge: 2023-04-19 | Disposition: A | Discharge status: Home / Self Care | Payer: Medicare Other | Attending: Emergency Medicine | Admitting: Emergency Medicine

## 2023-04-18 ENCOUNTER — Encounter: Payer: Self-pay | Admitting: Hematology

## 2023-04-18 ENCOUNTER — Ambulatory Visit (HOSPITAL_COMMUNITY)
Admission: RE | Admit: 2023-04-18 | Discharge: 2023-04-18 | Disposition: A | Discharge status: Home / Self Care | Payer: Medicare Other | Source: Ambulatory Visit | Attending: Hematology | Admitting: Hematology

## 2023-04-18 DIAGNOSIS — I10 Essential (primary) hypertension: Secondary | ICD-10-CM | POA: Diagnosis not present

## 2023-04-18 DIAGNOSIS — J90 Pleural effusion, not elsewhere classified: Secondary | ICD-10-CM | POA: Insufficient documentation

## 2023-04-18 DIAGNOSIS — K769 Liver disease, unspecified: Secondary | ICD-10-CM | POA: Diagnosis not present

## 2023-04-18 DIAGNOSIS — Z87891 Personal history of nicotine dependence: Secondary | ICD-10-CM | POA: Insufficient documentation

## 2023-04-18 DIAGNOSIS — R0609 Other forms of dyspnea: Secondary | ICD-10-CM

## 2023-04-18 DIAGNOSIS — I700 Atherosclerosis of aorta: Secondary | ICD-10-CM | POA: Diagnosis not present

## 2023-04-18 DIAGNOSIS — R0602 Shortness of breath: Secondary | ICD-10-CM | POA: Insufficient documentation

## 2023-04-18 DIAGNOSIS — E119 Type 2 diabetes mellitus without complications: Secondary | ICD-10-CM | POA: Insufficient documentation

## 2023-04-18 DIAGNOSIS — Z85118 Personal history of other malignant neoplasm of bronchus and lung: Secondary | ICD-10-CM | POA: Diagnosis not present

## 2023-04-18 DIAGNOSIS — J9811 Atelectasis: Secondary | ICD-10-CM | POA: Diagnosis not present

## 2023-04-18 DIAGNOSIS — Z7901 Long term (current) use of anticoagulants: Secondary | ICD-10-CM | POA: Diagnosis not present

## 2023-04-18 DIAGNOSIS — R0600 Dyspnea, unspecified: Secondary | ICD-10-CM | POA: Diagnosis not present

## 2023-04-18 LAB — COMPREHENSIVE METABOLIC PANEL
ALT: 11 U/L (ref 0–44)
AST: 23 U/L (ref 15–41)
Albumin: 2.5 g/dL — ABNORMAL LOW (ref 3.5–5.0)
Alkaline Phosphatase: 78 U/L (ref 38–126)
Anion gap: 8 (ref 5–15)
BUN: 7 mg/dL — ABNORMAL LOW (ref 8–23)
CO2: 28 mmol/L (ref 22–32)
Calcium: 8.9 mg/dL (ref 8.9–10.3)
Chloride: 106 mmol/L (ref 98–111)
Creatinine, Ser: 0.87 mg/dL (ref 0.44–1.00)
GFR, Estimated: >60 mL/min (ref >60)
Glucose, Bld: 109 mg/dL — ABNORMAL HIGH (ref 70–99)
Potassium: 4.7 mmol/L (ref 3.5–5.1)
Sodium: 142 mmol/L (ref 135–145)
Total Bilirubin: 0.6 mg/dL (ref 0.0–1.2)
Total Protein: 6.3 g/dL — ABNORMAL LOW (ref 6.5–8.1)

## 2023-04-18 LAB — TROPONIN I (HIGH SENSITIVITY): Troponin I (High Sensitivity): 20 ng/L — ABNORMAL HIGH (ref <18)

## 2023-04-18 LAB — CBC
HCT: 29.2 % — ABNORMAL LOW (ref 36.0–46.0)
Hemoglobin: 8.8 g/dL — ABNORMAL LOW (ref 12.0–15.0)
MCH: 29.2 pg (ref 26.0–34.0)
MCHC: 30.1 g/dL (ref 30.0–36.0)
MCV: 97 fL (ref 80.0–100.0)
Platelets: 329 10*3/uL (ref 150–400)
RBC: 3.01 MIL/uL — ABNORMAL LOW (ref 3.87–5.11)
RDW: 16.8 % — ABNORMAL HIGH (ref 11.5–15.5)
WBC: 7.9 10*3/uL (ref 4.0–10.5)
nRBC: 0 % (ref 0.0–0.2)

## 2023-04-18 MED ORDER — LIDOCAINE HCL (PF) 2 % IJ SOLN: 10.0000 mL | Freq: Once | INTRAMUSCULAR | Status: DC

## 2023-04-18 MED ORDER — ALBUTEROL SULFATE HFA 108 (90 BASE) MCG/ACT IN AERS: 2.0000 | INHALATION_SPRAY | RESPIRATORY_TRACT | Status: DC | PRN

## 2023-04-18 NOTE — ED Triage Notes (Signed)
Pt arrived from [facility], where Pt reports she came to the hospital for scheduled thoracentesis. Per note from previous provider:  Not sufficient fluid noted for thoracentesis today.  Patient requesting ED evaluation for chest tightness, decreased energy, decreased appetite and dyspnea on exertion for the last 1 week. Cancer center held chemo on 04/16/23 and patient was given 4g of IV magnesium that day with chest xray  Pt reports she is chronically on 2L Nasal Cannula. O2  is 99% on 2L inTriage.

## 2023-04-18 NOTE — ED Notes (Signed)
Triage RN place pt in bathroom. PT did not obtain UA sample

## 2023-04-18 NOTE — Progress Notes (Signed)
Not sufficient fluid noted for thoracentesis today.  Patient requesting ED evaluation for chest tightness, decreased energy, decreased appetite and dyspnea on exertion for the last 1 week. Cancer center held chemo on 04/16/23 and patient was given 4g of IV magnesium that day with chest xray. ED triage nurse given report upon patient's arrival to ED.

## 2023-04-19 ENCOUNTER — Ambulatory Visit (HOSPITAL_COMMUNITY): Payer: Medicare Other

## 2023-04-19 ENCOUNTER — Emergency Department (HOSPITAL_COMMUNITY): Payer: Medicare Other

## 2023-04-19 DIAGNOSIS — J90 Pleural effusion, not elsewhere classified: Secondary | ICD-10-CM | POA: Diagnosis not present

## 2023-04-19 DIAGNOSIS — K769 Liver disease, unspecified: Secondary | ICD-10-CM | POA: Diagnosis not present

## 2023-04-19 DIAGNOSIS — I700 Atherosclerosis of aorta: Secondary | ICD-10-CM | POA: Diagnosis not present

## 2023-04-19 DIAGNOSIS — J9811 Atelectasis: Secondary | ICD-10-CM | POA: Diagnosis not present

## 2023-04-19 LAB — URINALYSIS, ROUTINE W REFLEX MICROSCOPIC
Bilirubin Urine: NEGATIVE
Glucose, UA: NEGATIVE mg/dL
Hgb urine dipstick: NEGATIVE
Ketones, ur: NEGATIVE mg/dL
Leukocytes,Ua: NEGATIVE
Nitrite: NEGATIVE
Protein, ur: NEGATIVE mg/dL
Specific Gravity, Urine: 1.01 (ref 1.005–1.030)
pH: 5 (ref 5.0–8.0)

## 2023-04-19 LAB — BRAIN NATRIURETIC PEPTIDE: B Natriuretic Peptide: 502 pg/mL — ABNORMAL HIGH (ref 0.0–100.0)

## 2023-04-19 MED ORDER — HEPARIN SOD (PORK) LOCK FLUSH 100 UNIT/ML IV SOLN
500.0000 [IU] | Freq: Once | INTRAVENOUS | Status: AC
  Administered 2023-04-19: 500 [IU] via INTRAVENOUS
  Filled 2023-04-19: qty 5

## 2023-04-19 MED ORDER — FUROSEMIDE 10 MG/ML IJ SOLN
20.0000 mg | Freq: Once | INTRAMUSCULAR | Status: AC
  Administered 2023-04-19: 20 mg via INTRAVENOUS
  Filled 2023-04-19: qty 2

## 2023-04-19 MED ORDER — IOHEXOL 350 MG/ML SOLN
60.0000 mL | Freq: Once | INTRAVENOUS | Status: AC | PRN
  Administered 2023-04-19: 60 mL via INTRAVENOUS

## 2023-04-19 NOTE — ED Provider Notes (Signed)
[facility]-EMERGENCY DEPT [facility] Emergency Department Provider Note MRN:  [id number]  Arrival date & time: 04/19/23     Chief Complaint   Shortness of Breath   History of Present Illness   Denise [name] is a 73 y.o. year-old female with a history of lung cancer, diabetes presenting to the ED with chief complaint of shortness of breath.  Worsening shortness of breath over the past week or 2.  Significant dyspnea on exertion compared to her more recent baseline.  Uses chronic oxygen at home.  Denies chest pain, no leg pain or swelling.  Endorses compliance with her Eliquis.  Review of Systems  A thorough review of systems was obtained and all systems are negative except as noted in the HPI and PMH.   Patient's Health History    Past Medical History:  Diagnosis Date   Anemia    Aortic stenosis    Arthritis    Coronary artery calcification seen on CT scan    Dyspnea    Essential hypertension    GERD (gastroesophageal reflux disease)    History of lung cancer    Stage III adenocarcinoma status post chemoradiation   Port-A-Cath in place 01/06/2019   Type 2 diabetes mellitus [facility])     Past Surgical History:  Procedure Laterality Date   BIOPSY  09/25/2022   Procedure: BIOPSY;  Surgeon: Lanelle Bal, DO;  Location: [facility];  Service: Endoscopy;;   CHOLECYSTECTOMY  1997   COLONOSCOPY     COLONOSCOPY WITH PROPOFOL N/A 09/25/2022   Procedure: COLONOSCOPY WITH PROPOFOL;  Surgeon: Lanelle Bal, DO;  Location: [facility];  Service: Endoscopy;  Laterality: N/A;  11:00;ASA 3   ESOPHAGEAL BRUSHING  09/25/2022   Procedure: ESOPHAGEAL BRUSHING;  Surgeon: Lanelle Bal, DO;  Location: [facility];  Service: Endoscopy;;   ESOPHAGOGASTRODUODENOSCOPY (EGD) WITH PROPOFOL N/A 09/25/2022   Procedure: ESOPHAGOGASTRODUODENOSCOPY (EGD) WITH PROPOFOL;  Surgeon: Lanelle Bal, DO;  Location: [facility];  Service: Endoscopy;  Laterality: N/A;  11:00AM;ASA 3   GASTRIC BYPASS      INCISIONAL HERNIA REPAIR  04/11/11   IR IMAGING GUIDED PORT INSERTION  12/27/2018   Right   IR US GUIDE BX ASP/DRAIN  08/08/2021   LAPAROSCOPIC SALPINGOOPHERECTOMY     LAPAROTOMY  04/11/2011   Procedure: EXPLORATORY LAPAROTOMY;  Surgeon: Clovis Pu. Cornett, MD;  Location: [facility] ORS;  Service: General;  Laterality: N/A;  closure port hole   POLYPECTOMY  09/25/2022   Procedure: POLYPECTOMY;  Surgeon: Lanelle Bal, DO;  Location: [facility];  Service: Endoscopy;;   RIGHT/LEFT HEART CATH AND CORONARY ANGIOGRAPHY N/A 12/29/2019   Procedure: RIGHT/LEFT HEART CATH AND CORONARY ANGIOGRAPHY;  Surgeon: Lennette Bihari, MD;  [facility] INV[facility] LAB;  Service: Cardiovascular;  Laterality: N/A;   TOTAL HIP ARTHROPLASTY  03/07/2012   Procedure: TOTAL HIP ARTHROPLASTY ANTERIOR APPROACH;  Surgeon: Shelda Pal, MD;  Location: [facility];  Service: Orthopedics;  Laterality: Right;   TOTAL SHOULDER ARTHROPLASTY Left 01/21/2015   TOTAL SHOULDER ARTHROPLASTY Left 01/21/2015   Procedure: LEFT TOTAL SHOULDER ARTHROPLASTY;  Surgeon: Francena Hanly, MD;  Location: [facility] OR;  Service: Orthopedics;  Laterality: Left;   VAGINAL HYSTERECTOMY      Family History  Problem Relation Age of Onset   Breast cancer Mother    COPD Mother    Arthritis Mother    Diabetes Mother    Hypertension Mother    Hypertension Father    Diabetes Father  Breast cancer Sister    Thyroid cancer Brother    Heart attack Brother    Huntington's disease Maternal Grandmother     Social History   Socioeconomic History   Marital status: Single    Spouse name: Not on file   Number of children: Not on file   Years of education: 12th grade   Highest education level: Not on file  Occupational History   Occupation: Employed    Employer: [employer]  Tobacco Use   Smoking status: Former    Current packs/day: 0.00    Average packs/day: 1 pack/day for 12.0 years (12.0 ttl pk-yrs)    Types: Cigarettes    Start date: 03/20/1964     Quit date: 03/20/1976    Years since quitting: 47.1    Passive exposure: Past   Smokeless tobacco: Never  Vaping Use   Vaping status: Never Used  Substance and Sexual Activity   Alcohol use: No   Drug use: No   Sexual activity: Not Currently  Other Topics Concern   Not on file  Social History Narrative   Not on file   Social Drivers of Health   Financial Resource Strain: Low Risk  (12/06/2018)   Overall Financial Resource Strain (CARDIA)    Difficulty of Paying Living Expenses: Not very hard  Food Insecurity: No Food Insecurity (01/11/2023)   Hunger Vital Sign    Worried About Running Out of Food in the Last Year: Never true    Ran Out of Food in the Last Year: Never true  Transportation Needs: No Transportation Needs (01/11/2023)   PRAPARE - Administrator, Civil Service (Medical): No    Lack of Transportation (Non-Medical): No  Physical Activity: Inactive (12/06/2018)   Exercise Vital Sign    Days of Exercise per Week: 0 days    Minutes of Exercise per Session: 0 min  Stress: No Stress Concern Present (12/09/2018)   Received from [facility] of Occupational Health - Occupational Stress Questionnaire    Feeling of Stress : Not at all  Social Connections: Moderately Isolated (12/06/2018)   Social Connection and Isolation Panel [NHANES]    Frequency of Communication with Friends and Family: More than three times a week    Frequency of Social Gatherings with Friends and Family: Once a week    Attends Religious Services: More than 4 times per year    Active Member of [employer] or Organizations: No    Attends Banker Meetings: Never    Marital Status: Never married  Intimate Partner Violence: Not At Risk (01/11/2023)   Humiliation, Afraid, Rape, and Kick questionnaire    Fear of Current or Ex-Partner: No    Emotionally Abused: No    Physically Abused: No    Sexually Abused: No     Physical Exam   Vitals:    04/19/23 0200 04/19/23 0300  BP: 116/61 (!) 118/59  Pulse: 61 68  Resp: 17 13  Temp:    SpO2: 100% 100%    CONSTITUTIONAL: Well-appearing, NAD NEURO/PSYCH:  Alert and oriented x 3, no focal deficits EYES:  eyes equal and reactive ENT/NECK:  no LAD, no JVD CARDIO: Regular rate, well-perfused, normal S1 and S2 PULM:  CTAB no wheezing or rhonchi GI/GU:  non-distended, non-tender MSK/SPINE:  No gross deformities, no edema SKIN:  no rash, atraumatic   *Additional and/or pertinent findings included in MDM below  Diagnostic and Interventional Summary  EKG Interpretation Date/Time:  Wednesday April 18 2023 14:45:38 EST Ventricular Rate:  68 PR Interval:  136 QRS Duration:  122 QT Interval:  438 QTC Calculation: 465 R Axis:   -63  Text Interpretation: Sinus rhythm with Premature atrial complexes Right bundle branch block Left anterior fascicular block Bifascicular block Non-specific ST-t changes No significant change since last tracing Artifact Confirmed by Cathren Laine ([id number]) on 04/18/2023 7:40:35 PM       Labs Reviewed  COMPREHENSIVE METABOLIC PANEL - Abnormal; Notable for the following components:      Result Value   Glucose, Bld 109 (*)    BUN 7 (*)    Total Protein 6.3 (*)    Albumin 2.5 (*)    All other components within normal limits  URINALYSIS, ROUTINE W REFLEX MICROSCOPIC - Abnormal; Notable for the following components:   Color, Urine COLORLESS (*)    All other components within normal limits  CBC - Abnormal; Notable for the following components:   RBC 3.01 (*)    Hemoglobin 8.8 (*)    HCT 29.2 (*)    RDW 16.8 (*)    All other components within normal limits  BRAIN NATRIURETIC PEPTIDE - Abnormal; Notable for the following components:   B Natriuretic Peptide 502.0 (*)    All other components within normal limits  TROPONIN I (HIGH SENSITIVITY) - Abnormal; Notable for the following components:   Troponin I (High Sensitivity) 20 (*)    All other components  within normal limits    CT Angio Chest Pulmonary Embolism (PE) W or WO Contrast  Final Result      Medications  albuterol (VENTOLIN HFA) 108 (90 Base) MCG/ACT inhaler 2 puff (has no administration in time range)  furosemide (LASIX) injection 20 mg (20 mg Intravenous Given 04/19/23 0104)  iohexol (OMNIPAQUE) 350 MG/ML injection 60 mL (60 mLs Intravenous Contrast Given 04/19/23 0036)     Procedures  /  Critical Care Procedures  ED Course and Medical Decision Making  Initial Impression and Ddx Dyspnea exertion, was sent here by her team of doctors for evaluation with x-ray and ultrasound and possibly thoracentesis.  Has a history of lung cancer with recurrent malignant pleural effusion.  Ultrasound done earlier today without enough fluid worthy of thoracentesis.  Sent here to the emergency department for Lasix given the possible pulmonary edema on x-ray earlier today.  Patient does not appear significantly fluid up on exam, pulmonary edema is most likely is the cause but also considering PE, complication from lung cancer, pneumonia.  Will obtain CTA.  Past medical/surgical history that increases complexity of ED encounter: Lung cancer  Interpretation of Diagnostics I personally reviewed the EKG and my interpretation is as follows: Sinus rhythm  Labs reassuring with no significant blood count or electrolyte disturbance.  CTA without PE or significant abnormalities or changes  Patient Reassessment and Ultimate Disposition/Management     Patient continues to look well on reassessment, with urine output with dose of Lasix, possibly patient has some pulmonary edema causing the symptoms but nothing significant on CT.  On baseline oxygen levels in no acute distress with no increased work of breathing.  Appropriate for discharge with outpatient follow-up.  Patient management required discussion with the following services or consulting groups:  None  Complexity of Problems Addressed Acute  illness or injury that poses threat of life of bodily function  Additional Data Reviewed and Analyzed Further history obtained from: Prior labs/imaging results  Additional Factors Impacting ED Encounter Risk Consideration  of hospitalization  Elmer Sow. Pilar Plate, MD [facility] Emergency Medicine [facility] [email or web address]  Final Clinical Impressions(s) / ED Diagnoses     ICD-10-CM   1. Dyspnea on exertion  R06.09       ED Discharge Orders     None        Discharge Instructions Discussed with and Provided to Patient:     Discharge Instructions      You were evaluated in the Emergency Department and after careful evaluation, we did not find any emergent condition requiring admission or further testing in the hospital.  Your exam/testing today is overall reassuring.  Recommend continued follow-up with your regular doctors to discuss your symptoms.  Please return to the Emergency Department if you experience any worsening of your condition.   Thank you for allowing [place] to be a part of your care.       Sabas Sous, MD 04/19/23 [phone]

## 2023-04-19 NOTE — Discharge Instructions (Signed)
You were evaluated in the Emergency Department and after careful evaluation, we did not find any emergent condition requiring admission or further testing in the hospital. ? ?Your exam/testing today is overall reassuring.  Recommend continued follow-up with your regular doctors to discuss your symptoms. ? ?Please return to the Emergency Department if you experience any worsening of your condition.   Thank you for allowing [place] to be a part of your care. ?

## 2023-04-19 NOTE — ED Notes (Signed)
Pt endorses she can not give a urine sample due to urinating right before sitting in the bed.

## 2023-04-25 ENCOUNTER — Ambulatory Visit (HOSPITAL_COMMUNITY): Payer: Medicare Other | Attending: Family Medicine

## 2023-04-25 ENCOUNTER — Encounter (HOSPITAL_COMMUNITY): Payer: Self-pay

## 2023-04-25 DIAGNOSIS — R601 Generalized edema: Secondary | ICD-10-CM | POA: Insufficient documentation

## 2023-04-25 DIAGNOSIS — M79605 Pain in left leg: Secondary | ICD-10-CM | POA: Diagnosis not present

## 2023-04-25 DIAGNOSIS — S81802S Unspecified open wound, left lower leg, sequela: Secondary | ICD-10-CM | POA: Insufficient documentation

## 2023-04-25 NOTE — Therapy (Signed)
 OUTPATIENT PHYSICAL THERAPY Wound TREATMENT Patient Name: Denise [name] MRN: [id number] DOB:07-Nov-1950, 73 y.o., female Today's Date: 04/25/2023     PCP: Leigh Lung, MD REFERRING PROVIDER: Leigh Lung, MD  END OF SESSION:   PT End of Session - 04/25/23 1432     Visit Number 22    Number of Visits 26    Date for PT Re-Evaluation 05/22/23    Authorization Type new insurance as of 2025 [facility]; request submitted and no auth needed    Authorization Time Period 16 from 01/05-->05/17/23    Authorization - Visit Number 8    Authorization - Number of Visits 16    Progress Note Due on Visit 26    PT Start Time 1350    PT Stop Time 1428    PT Time Calculation (min) 38 min    Equipment Utilized During Treatment Oxygen     Activity Tolerance Patient tolerated treatment well    Behavior During Therapy WFL for tasks assessed/performed                  Past Medical History:  Diagnosis Date   Anemia    Aortic stenosis    Arthritis    Coronary artery calcification seen on CT scan    Dyspnea    Essential hypertension    GERD (gastroesophageal reflux disease)    History of lung cancer    Stage III adenocarcinoma status post chemoradiation   Port-A-Cath in place 01/06/2019   Type 2 diabetes mellitus (HCC)    Past Surgical History:  Procedure Laterality Date   BIOPSY  09/25/2022   Procedure: BIOPSY;  Surgeon: Cindie Carlin POUR, DO;  [facility];  Service: Endoscopy;;   CHOLECYSTECTOMY  1997   COLONOSCOPY     COLONOSCOPY WITH PROPOFOL  N/A 09/25/2022   Procedure: COLONOSCOPY WITH PROPOFOL ;  Surgeon: Cindie Carlin POUR, DO;  [facility];  Service: Endoscopy;  Laterality: N/A;  11:00;ASA 3   ESOPHAGEAL BRUSHING  09/25/2022   Procedure: ESOPHAGEAL BRUSHING;  Surgeon: Cindie Carlin POUR, DO;  [facility];  Service: Endoscopy;;   ESOPHAGOGASTRODUODENOSCOPY (EGD) WITH PROPOFOL  N/A 09/25/2022   Procedure: ESOPHAGOGASTRODUODENOSCOPY (EGD) WITH PROPOFOL ;  Surgeon:  Cindie Carlin POUR, DO;  [facility];  Service: Endoscopy;  Laterality: N/A;  11:00AM;ASA 3   GASTRIC BYPASS     INCISIONAL HERNIA REPAIR  04/11/11   IR IMAGING GUIDED PORT INSERTION  12/27/2018   Right   IR US  GUIDE BX ASP/DRAIN  08/08/2021   LAPAROSCOPIC SALPINGOOPHERECTOMY     LAPAROTOMY  04/11/2011   Procedure: EXPLORATORY LAPAROTOMY;  Surgeon: Debby LABOR. Cornett, MD;  [facility]: [facility];  Service: General;  Laterality: N/A;  closure port hole   POLYPECTOMY  09/25/2022   Procedure: POLYPECTOMY;  Surgeon: Cindie Carlin POUR, DO;  [facility];  Service: Endoscopy;;   RIGHT/LEFT HEART CATH AND CORONARY ANGIOGRAPHY N/A 12/29/2019   Procedure: RIGHT/LEFT HEART CATH AND CORONARY ANGIOGRAPHY;  Surgeon: Burnard Debby LABOR, MD;  [facility];  Service: Cardiovascular;  Laterality: N/A;   TOTAL HIP ARTHROPLASTY  03/07/2012   Procedure: TOTAL HIP ARTHROPLASTY ANTERIOR APPROACH;  Surgeon: Donnice JONETTA Car, MD;  Location: [facility];  Service: Orthopedics;  Laterality: Right;   TOTAL SHOULDER ARTHROPLASTY Left 01/21/2015   TOTAL SHOULDER ARTHROPLASTY Left 01/21/2015   Procedure: LEFT TOTAL SHOULDER ARTHROPLASTY;  Surgeon: Franky Pointer, MD;  [facility]: [facility];  Service: Orthopedics;  Laterality: Left;   VAGINAL HYSTERECTOMY  Patient Active Problem List   Diagnosis Date Noted   Colitis 01/11/2023   Pneumonia of left lower lobe due to infectious organism 12/28/2022   Acute respiratory failure with hypoxia (HCC) 12/28/2022   Loculated pleural effusion 12/28/2022   Acute on chronic respiratory failure with hypoxia (HCC) 12/24/2022   Pleural effusion, left 12/24/2022   Multifocal pneumonia 12/24/2022   Atrial fibrillation with RVR (HCC) 12/24/2022   Severe sepsis (HCC) 12/24/2022   Iron deficiency anemia 09/02/2022   Early satiety 09/02/2022   Intramuscular hematoma 08/03/2021   Pulmonary embolus (HCC) 07/17/2021   Type 2 diabetes mellitus (HCC)    Essential hypertension     History of lung cancer    Class 3 obesity    Hypokalemia    Malignant neoplasm metastatic to brain (HCC) 06/04/2020   Meningioma, cerebral (HCC) 05/18/2020   Abnormal myocardial perfusion study    Aortic stenosis    DOE (dyspnea on exertion) 10/09/2019   Meningioma (HCC) 03/20/2019   Port-A-Cath in place 01/06/2019   Adenocarcinoma of left lung (HCC) 12/09/2018   S/P shoulder replacement 01/21/2015   Expected blood loss anemia 03/08/2012   Morbid obesity with BMI of 45.0-49.9, adult (HCC) 03/08/2012   S/P right THA, AA 03/07/2012   Small bowel obstruction (HCC) 04/11/2011   Incisional hernia with obstruction 04/11/2011   Pelvic mass 03/29/2011   Radicular low back pain 11/03/2010   Hip arthritis 11/03/2010    ONSET DATE: 12/28/22  REFERRING DIAG:    D18.197J (ICD-10-CM) - Unspecified open wound, left lower leg, initial encounter  R60.0 (ICD-10-CM) - Localized edema  C34.92 (ICD-10-CM) - Malignant neoplasm of unspecified part of left bronchus or lung    THERAPY DIAG:  S81.802; open wounds of left LE, Rt LE wounds  Lt LE pain   Wound Therapy - 04/25/23 0001     Subjective Pt in ER due to increased difficulty breathing, arrived with compression garments on Rt and profore on Lt.  No reports of pain.  Reports a blister present Rt shin.    Patient and Family Stated Goals wounds to heal    Prior Treatments antibiotic, self care, lasix     Pain Scale 0-10    Pain Score 0-No pain    Wound Properties Date First Assessed: 04/06/23 Time First Assessed: 1020 Wound Type: Other (Comment) Location: Foot Location Orientation: Anterior;Right Wound Description (Comments): blister popped with exposed tissue Final Assessment Date: 04/25/23 Final Assessment Time: 1430   Wound Properties Location: Tibial Location Orientation: Left;Posterior;Proximal Wound Description (Comments): superior posterior Lt LE Final Assessment Date: 04/25/23 Final Assessment Time: 1430   Wound Properties Location: Tibial  Location Orientation: Distal;Left;Posterior Wound Description (Comments): inferior posterior LT LE wound Final Assessment Date: 04/25/23 Final Assessment Time: 1430   Wound Properties Date First Assessed: 03/05/23 Time First Assessed: 1205 Location: Pretibial Location Orientation: Left;Proximal Wound Description (Comments): Left LE, blistering and weeping   Wound Image Images linked: 1    Dressing Type Silver hydrofiber;Compression wrap    Dressing Changed Changed    Dressing Status Old drainage;New drainage    Dressing Change Frequency PRN    Site / Wound Assessment Red;Painful;Granulation tissue;Bleeding    % Wound base Red or Granulating 100%    Peri-wound Assessment Edema;Induration    Drainage Amount Minimal    Drainage Description Serous    Treatment Cleansed;Debridement (Selective)    Selective Debridement (non-excisional) - Location devitalized tissue    Selective Debridement (non-excisional) - Tools Used Forceps    Selective Debridement (non-excisional) -  Tissue Removed slough and dead skin    Wound Therapy - Clinical Statement see below    Factors Delaying/Impairing Wound Healing Vascular compromise    Hydrotherapy Plan Debridement;Dressing change    Wound Therapy - Frequency 3X / week    Wound Therapy - Current Recommendations PT    Wound Plan see for debridement, manual and dressing change    Dressing  Lt silver hydrofiber, profore                  PATIENT EDUCATION: Education details: Keep bandage on unless it becomes wet or painful  Person educated: Patient Education method: Explanation Education comprehension: returned demonstration   HOME EXERCISE PROGRAM: Ankle pumps, LAQ, hip ab/adduction, marching    GOALS: Goals reviewed with patient? No  SHORT TERM GOALS: Target date: 02/28/23  Pt wound to be 100% granulated  Baseline: Goal status: on going   2.  PT wounds to no longer be weeping  Baseline:  Goal status: MET  3.  Pt pain to be no greater  than a 5/10 Baseline:  Goal status: MET   LONG TERM GOALS: Target date: 03/22/22  PT to have no pain in her  LE's Baseline:  Goal status: MET  2.  PT wound to be healed  Baseline: 03/02/23: Lt LE has healed, continue with Rt LE.   Goal status: IN PROGRESS  3.  Pt to verbalize that she understands the importance of wearing compression garment, completing her exercises  Baseline: 12/123/24:  Pt wearing compression garment with Lt LE. Goal status: MET   ASSESSMENT:  CLINICAL IMPRESSION: Removed dressings with vast improvements in wound size and decreased drainage.  Pt with all wounds healed except for distal Lt shin which has decreased drainage compared to last session.  Selective debridement for removal of devitalized tissue and significant amount of dead/dry skin.  Reduced frequency to 1x/week and anticipate wounds to be fully healed or close to next week.  Continued with compression for edema control and silverhydrofiber.  Pt reports blister present Rt skin, therapist looked at area with small open spot .3x.3cm, no skilled intervention needed.  Encouraged pt to keep bandaid over if drainage noted and to continue with compression garments.  Pt is a returning wound pt. That will benefit from additonal pump and night sleeve to assure wounds not to return.  OBJECTIVE IMPAIRMENTS: decreased activity tolerance, difficulty walking, increased edema, and pain.   ACTIVITY LIMITATIONS: dressing, hygiene/grooming, and locomotion level  PARTICIPATION LIMITATIONS: community activity  PERSONAL FACTORS: Time since onset of injury/illness/exacerbation are also affecting patient's functional outcome. Type 2 diabetes mellitus (HCC), Metastatic adenocarcinoma of the lung,    Essential hypertensiChronic venous stasis edema BLEs - Right greater than left   Class 3 obesity   Atrial fibrillation with RVR (HCC  REHAB POTENTIAL: Good  CLINICAL DECISION MAKING: Stable/uncomplicated  EVALUATION  COMPLEXITY: Moderate  PLAN: PT FREQUENCY: 2x/week  PT DURATION: 8 weeks  PLANNED INTERVENTIONS: 97110-Therapeutic exercises, 97535- Self Care, [id number]- Manual therapy, Patient/Family education, Manual lymph drainage, Compression bandaging, and wound care  to include debridement.    PLAN FOR NEXT SESSION: Continue with debridement and dressing change as indicated.  Complete weekly measurements and photographs.     Augustin Mclean, LPTA/CLT; [facility] [phone]  04/25/2023, 4:35 PM   04/25/2023, 4:35 PM

## 2023-04-26 ENCOUNTER — Other Ambulatory Visit: Payer: Self-pay

## 2023-04-26 DIAGNOSIS — C3492 Malignant neoplasm of unspecified part of left bronchus or lung: Secondary | ICD-10-CM

## 2023-04-27 ENCOUNTER — Ambulatory Visit (HOSPITAL_COMMUNITY): Payer: Medicare Other

## 2023-04-29 NOTE — Progress Notes (Signed)
 [facility] [address], [place] [address]    Clinic Day:  04/30/2023  Referring physician: Wilburn Handler, MD  Patient Care Team: Wilburn Handler, MD as PCP - General (Family Medicine) Gerard Knight, MD as PCP - Cardiology (Cardiology) Paulett Boros, MD as Medical Oncologist (Medical Oncology)   ASSESSMENT & PLAN:   Assessment: 1.  Adenocarcinoma of left lung (HCC) -Chemoradiation therapy with carboplatin  and paclitaxel  from 01/07/2019 through 02/11/2019. -Consolidation immunotherapy with durvalumab  from 03/19/2019 through 04/15/2018, held due to pneumonitis. -CT chest on 07/02/2019 shows left upper lobe lung mass measuring 2.6 x 2.0 cm.  It shows improvement in size. -MRI of the brain on 07/18/2019 showed left sphenoid wing meningioma measuring 2.6 x 2.0 x 2.6 cm unchanged.  No new enhancing intracranial lesion.  Increased left temporal white matter edema. -Durvalumab  restarted on 07/24/2019. -She was evaluated by cardiology with a stress test.  EF was 46%.  She underwent cardiac catheterization which did not show any abnormalities. - 1 year of durvalumab  completed on 07/23/2020. - Liver mass biopsy (08/08/2021): Adenocarcinoma, CK7 positive, negative for TTF-1, Napsin a, GATA3, ER, CK20, CDX2 - PET scan (08/25/2021): Right upper lobe lung nodule 9 mm, SUV 5.0.  Groundglass and solid nodule periphery of the right upper lobe 8 mm, SUV 2.45.  Right lobe liver mass 4.5 x 3.6 cm, SUV 7.78. - NGS testing: PD-L1 negative, TMB-low, MSI-stable, K-ras G12 D.  No other targetable mutations. - Carboplatin , pemetrexed  and pembrolizumab  4 cycles from 09/22/2021 through 11/25/2021, followed by maintenance pemetrexed  and pembrolizumab .  Last pemetrexed  and pembrolizumab  on 11/30/2022.  Pembrolizumab  discontinued due to colitis. -  CT CAP (11/10/2021): Reduced size of liver mass and interval resolution of previously seen groundglass pulmonary nodules.  There is a new inflammatory right  upper lobe nodule.    Plan: 1.  Metastatic adenocarcinoma of the lung to the liver and right lung: - Last pemetrexed  treatment on 03/26/2023. - Reviewed CT angiogram from 04/19/2023: No pulmonary embolism.  Bilateral pleural effusion, right more than left. - Ultrasound chest on 04/18/2023: Trace right pleural effusion not adequate for thoracentesis. - She reports breathing is better.  She requires oxygen  4 L/min only when she is walking.  Saturations off oxygen  in the office for 91-92%. - Reviewed labs from 04/30/2023: Normal LFTs and creatinine.  CBC grossly normal. - She will restart pemetrexed  dose reduced today.  RTC 3 weeks for follow-up. - Will give her Cipro  eyedrops for erythema and matting of the eyelids.    2.  Meningioma: - MRI of the brain on 10/02/2022: Stable left sphenoid wing meningioma.  3.  Unprovoked pulmonary embolism: - Continue Eliquis  5 mg twice daily indefinitely.   4.  Leg swellings: - Continue Lasix  20 mg daily.   5.  Normocytic anemia: - Colonoscopy in 2024 showed nonbleeding internal hemorrhoids.  Hemoglobin today is 9.1.  Anemia also from myelosuppression.   6.  Hypomagnesemia: - Continue magnesium  twice daily.  Magnesium  is normal    Orders Placed This Encounter  Procedures   Magnesium     Standing Status:   Future    Expected Date:   07/23/2023    Expiration Date:   07/22/2024   CBC with Differential    Standing Status:   Future    Expected Date:   07/23/2023    Expiration Date:   07/22/2024   Comprehensive metabolic panel    Standing Status:   Future    Expected Date:   07/23/2023  Expiration Date:   07/22/2024   T4    Standing Status:   Future    Expected Date:   07/23/2023    Expiration Date:   07/22/2024   TSH    Standing Status:   Future    Expected Date:   07/23/2023    Expiration Date:   07/22/2024   Magnesium     Standing Status:   Future    Expected Date:   08/13/2023    Expiration Date:   08/12/2024   CBC with Differential    Standing Status:    Future    Expected Date:   08/13/2023    Expiration Date:   08/12/2024   Comprehensive metabolic panel    Standing Status:   Future    Expected Date:   08/13/2023    Expiration Date:   08/12/2024   Magnesium     Standing Status:   Future    Expected Date:   09/03/2023    Expiration Date:   09/02/2024   CBC with Differential    Standing Status:   Future    Expected Date:   09/03/2023    Expiration Date:   09/02/2024   Comprehensive metabolic panel    Standing Status:   Future    Expected Date:   09/03/2023    Expiration Date:   09/02/2024      I,Katie Daubenspeck,acting as a scribe for Paulett Boros, MD.,have documented all relevant documentation on the behalf of Paulett Boros, MD,as directed by  Paulett Boros, MD while in the presence of Paulett Boros, MD.   I, Paulett Boros MD, have reviewed the above documentation for accuracy and completeness, and I agree with the above.   Paulett Boros, MD   2/10/20251:55 PM  CHIEF COMPLAINT:   Diagnosis: stage IV adenocarcinoma of the left lung    Cancer Staging  Adenocarcinoma of left lung [facility]) Staging form: Lung, AJCC 8th Edition - Clinical stage from 01/02/2019: Stage IIIB (cT3, cN2, cM0) - Signed by Paulett Boros, MD on 01/02/2019 - Pathologic stage from 09/07/2021: Stage IVA (pTX, pNX, pM1b) - Unsigned    Prior Therapy: 1. Chemoradiation with carboplatin  and paclitaxel  from 01/07/2019 to 02/11/2019. 2. Consolidation with durvalumab  from 03/19/2019 to 04/16/2019, held due to pneumonitis.  Current Therapy:  Pemetrexed  and pembrolizumab  maintenance    HISTORY OF PRESENT ILLNESS:   Oncology History  Adenocarcinoma of left lung (HCC)  12/09/2018 Initial Diagnosis   Adenocarcinoma of left lung (HCC)   01/02/2019 Cancer Staging   Staging form: Lung, AJCC 8th Edition - Clinical stage from 01/02/2019: Stage IIIB (cT3, cN2, cM0) - Signed by Paulett Boros, MD on 01/02/2019   01/07/2019 -  02/11/2019 Chemotherapy   The patient had palonosetron  (ALOXI ) injection 0.25 mg, 0.25 mg, Intravenous,  Once, 6 of 6 cycles Administration: 0.25 mg (01/07/2019), 0.25 mg (01/14/2019), 0.25 mg (01/21/2019), 0.25 mg (01/28/2019), 0.25 mg (02/04/2019), 0.25 mg (02/11/2019) CARBOplatin  (PARAPLATIN ) 270 mg in sodium chloride  0.9 % 250 mL chemo infusion, 270 mg (100 % of original dose 266.4 mg), Intravenous,  Once, 6 of 6 cycles Dose modification:   (original dose 266.4 mg, Cycle 1),   (original dose 266.4 mg, Cycle 2),   (original dose 266.4 mg, Cycle 3),   (original dose 266.4 mg, Cycle 4) Administration: 270 mg (01/07/2019), 270 mg (01/14/2019), 270 mg (01/21/2019), 270 mg (01/28/2019), 270 mg (02/04/2019), 270 mg (02/11/2019) PACLitaxel  (TAXOL ) 108 mg in sodium chloride  0.9 % 250 mL chemo infusion (</= 80mg /m2), 45 mg/m2 = 108 mg,  Intravenous,  Once, 6 of 6 cycles Administration: 108 mg (01/07/2019), 108 mg (01/14/2019), 108 mg (01/21/2019), 108 mg (01/28/2019), 108 mg (02/04/2019), 108 mg (02/11/2019) fosaprepitant  (EMEND) 150 mg, dexamethasone  (DECADRON ) 12 mg in sodium chloride  0.9 % 145 mL IVPB, , Intravenous,  Once, 5 of 5 cycles Administration:  (01/14/2019),  (01/21/2019),  (01/28/2019),  (02/04/2019),  (02/11/2019)  for chemotherapy treatment.    03/19/2019 - 07/23/2020 Chemotherapy   Patient is on Treatment Plan : LUNG DURVALUMAB  Q14D     09/22/2021 - 11/04/2021 Chemotherapy   Patient is on Treatment Plan : LUNG Carboplatin  (5) + Pemetrexed  (500) + Pembrolizumab  (200) D1 q21d Induction x 4 cycles / Maintenance Pemetrexed  (500) + Pembrolizumab  (200) D1 q21d     09/22/2021 -  Chemotherapy   Patient is on Treatment Plan : LUNG Maintenance Alimta q 21 days        INTERVAL HISTORY:   Denise [name] is a 73 y.o. female presenting to clinic today for follow up of stage IV adenocarcinoma of the left lung. She was last seen by me on 04/16/23.  Today, she states that she is doing well overall. Her appetite level  is at 100%. Her energy level is at 50%.  PAST MEDICAL HISTORY:   Past Medical History: Past Medical History:  Diagnosis Date   Anemia    Aortic stenosis    Arthritis    Coronary artery calcification seen on CT scan    Dyspnea    Essential hypertension    GERD (gastroesophageal reflux disease)    History of lung cancer    Stage III adenocarcinoma status post chemoradiation   Port-A-Cath in place 01/06/2019   Type 2 diabetes mellitus [facility])     Surgical History: Past Surgical History:  Procedure Laterality Date   BIOPSY  09/25/2022   Procedure: BIOPSY;  Surgeon: Vinetta Greening, DO;  [facility]: [facility];  Service: Endoscopy;;   CHOLECYSTECTOMY  1997   COLONOSCOPY     COLONOSCOPY WITH PROPOFOL  N/A 09/25/2022   Procedure: COLONOSCOPY WITH PROPOFOL ;  Surgeon: Vinetta Greening, DO;  Location: [facility];  Service: Endoscopy;  Laterality: N/A;  11:00;ASA 3   ESOPHAGEAL BRUSHING  09/25/2022   Procedure: ESOPHAGEAL BRUSHING;  Surgeon: Vinetta Greening, DO;  [facility];  Service: Endoscopy;;   ESOPHAGOGASTRODUODENOSCOPY (EGD) WITH PROPOFOL  N/A 09/25/2022   Procedure: ESOPHAGOGASTRODUODENOSCOPY (EGD) WITH PROPOFOL ;  Surgeon: Vinetta Greening, DO;  [facility];  Service: Endoscopy;  Laterality: N/A;  11:00AM;ASA 3   GASTRIC BYPASS     INCISIONAL HERNIA REPAIR  04/11/11   IR IMAGING GUIDED PORT INSERTION  12/27/2018   Right   IR US  GUIDE BX ASP/DRAIN  08/08/2021   LAPAROSCOPIC SALPINGOOPHERECTOMY     LAPAROTOMY  04/11/2011   Procedure: EXPLORATORY LAPAROTOMY;  Surgeon: Brandy Cal. Cornett, MD;  Location: [facility];  Service: General;  Laterality: N/A;  closure port hole   POLYPECTOMY  09/25/2022   Procedure: POLYPECTOMY;  Surgeon: Vinetta Greening, DO;  [facility];  Service: Endoscopy;;   RIGHT/LEFT HEART CATH AND CORONARY ANGIOGRAPHY N/A 12/29/2019   Procedure: RIGHT/LEFT HEART CATH AND CORONARY ANGIOGRAPHY;  Surgeon: Millicent Ally, MD;  Location: [facility]  INVASIVE CV LAB;  Service: Cardiovascular;  Laterality: N/A;   TOTAL HIP ARTHROPLASTY  03/07/2012   Procedure: TOTAL HIP ARTHROPLASTY ANTERIOR APPROACH;  Surgeon: Bevin Bucks, MD;  Location: [facility];  Service: Orthopedics;  Laterality: Right;   TOTAL SHOULDER ARTHROPLASTY Left 01/21/2015  TOTAL SHOULDER ARTHROPLASTY Left 01/21/2015   Procedure: LEFT TOTAL SHOULDER ARTHROPLASTY;  Surgeon: Ellard Gunning, MD;  Location: [facility] OR;  Service: Orthopedics;  Laterality: Left;   VAGINAL HYSTERECTOMY      Social History: Social History   Socioeconomic History   Marital status: Single    Spouse name: Not on file   Number of children: Not on file   Years of education: 12th grade   Highest education level: Not on file  Occupational History   Occupation: Employed    Employer: [employer]  Tobacco Use   Smoking status: Former    Current packs/day: 0.00    Average packs/day: 1 pack/day for 12.0 years (12.0 ttl pk-yrs)    Types: Cigarettes    Start date: 03/20/1964    Quit date: 03/20/1976    Years since quitting: 47.1    Passive exposure: Past   Smokeless tobacco: Never  Vaping Use   Vaping status: Never Used  Substance and Sexual Activity   Alcohol  use: No   Drug use: No   Sexual activity: Not Currently  Other Topics Concern   Not on file  Social History Narrative   Not on file   Social Drivers of Health   Financial Resource Strain: Low Risk  (12/06/2018)   Overall Financial Resource Strain (CARDIA)    Difficulty of Paying Living Expenses: Not very hard  Food Insecurity: No Food Insecurity (01/11/2023)   Hunger Vital Sign    Worried About Running Out of Food in the Last Year: Never true    Ran Out of Food in the Last Year: Never true  Transportation Needs: No Transportation Needs (01/11/2023)   PRAPARE - Administrator, Civil Service (Medical): No    Lack of Transportation (Non-Medical): No  Physical Activity: Inactive (12/06/2018)   Exercise Vital Sign     Days of Exercise per Week: 0 days    Minutes of Exercise per Session: 0 min  Stress: No Stress Concern Present (12/09/2018)   Received from [facility] of Occupational Health - Occupational Stress Questionnaire    Feeling of Stress : Not at all  Social Connections: Moderately Isolated (12/06/2018)   Social Connection and Isolation Panel [NHANES]    Frequency of Communication with Friends and Family: More than three times a week    Frequency of Social Gatherings with Friends and Family: Once a week    Attends Religious Services: More than 4 times per year    Active Member of [employer] or Organizations: No    Attends Banker Meetings: Never    Marital Status: Never married  Intimate Partner Violence: Not At Risk (01/11/2023)   Humiliation, Afraid, Rape, and Kick questionnaire    Fear of Current or Ex-Partner: No    Emotionally Abused: No    Physically Abused: No    Sexually Abused: No    Family History: Family History  Problem Relation Age of Onset   Breast cancer Mother    COPD Mother    Arthritis Mother    Diabetes Mother    Hypertension Mother    Hypertension Father    Diabetes Father    Breast cancer Sister    Thyroid  cancer Brother    Heart attack Brother    Huntington's disease Maternal Grandmother     Current Medications:  Current Outpatient Medications:    ciprofloxacin  (CILOXAN ) 0.3 % ophthalmic solution, Place 1 drop into both eyes every 4 (four)  hours while awake. Administer 1 drop, every 2 hours, while awake, for 2 days. Then 1 drop, every 4 hours, while awake, for the next 5 days., Disp: 5 mL, Rfl: 0   rivaroxaban  (XARELTO ) 20 MG TABS tablet, Take 1 tablet (20 mg total) by mouth daily with supper., Disp: 30 tablet, Rfl: 3   acetaminophen  (TYLENOL ) 500 MG tablet, Take 500 mg by mouth every 6 (six) hours as needed for moderate pain., Disp: , Rfl:    albuterol  (VENTOLIN  HFA) 108 (90 Base) MCG/ACT inhaler, Inhale 2  puffs into the lungs every 6 (six) hours as needed for wheezing or shortness of breath., Disp: 6.7 g, Rfl: 0   Calcium  Carbonate Antacid (TUMS PO), Take 4 tablets by mouth daily., Disp: , Rfl:    carvedilol  (COREG ) 3.125 MG tablet, Take 1 tablet (3.125 mg total) by mouth 2 (two) times daily., Disp: 180 tablet, Rfl: 3   Cyanocobalamin  (VITAMIN B-12) 2500 MCG SUBL, Place 2,500 mcg under the tongue every morning. , Disp: , Rfl:    ELIQUIS  5 MG TABS tablet, TAKE 1 TABLET BY MOUTH TWICE A DAY, Disp: 60 tablet, Rfl: 6   ferrous sulfate  325 (65 FE) MG tablet, Take 325 mg by mouth every evening., Disp: , Rfl:    folic acid  (FOLVITE ) 1 MG tablet, TAKE 1 TABLET BY MOUTH EVERY DAY, Disp: 90 tablet, Rfl: 1   gabapentin  (NEURONTIN ) 300 MG capsule, Take 300 mg by mouth daily., Disp: , Rfl:    gabapentin  (NEURONTIN ) 600 MG tablet, Take 600 mg by mouth at bedtime., Disp: , Rfl:    Glycerin-Hypromellose-PEG 400 (DRY EYE RELIEF DROPS OP), Apply 1 drop to eye daily as needed (dry eyes)., Disp: , Rfl:    ibuprofen  (ADVIL ) 800 MG tablet, Take 1 tablet (800 mg total) by mouth every 8 (eight) hours as needed., Disp: 9 tablet, Rfl: 0   magnesium  oxide (MAG-OX) 400 (240 Mg) MG tablet, Take 1 tablet (400 mg total) by mouth 2 (two) times daily., Disp: 60 tablet, Rfl: 4   midodrine  (PROAMATINE ) 5 MG tablet, Take 1 tablet (5 mg total) by mouth 3 (three) times daily with meals., Disp: 90 tablet, Rfl: 5   Multiple Vitamin (MULITIVITAMIN WITH MINERALS) TABS, Take 1 tablet by mouth daily., Disp: , Rfl:    omeprazole (PRILOSEC) 20 MG capsule, Take 20 mg by mouth daily., Disp: , Rfl:    Potassium Chloride  ER 20 MEQ TBCR, Take 2 tablets by mouth daily., Disp: , Rfl:  No current facility-administered medications for this visit.  Facility-Administered Medications Ordered in Other Visits:    magnesium  sulfate 2 GM/50ML IVPB, , , ,    sodium chloride  flush (NS) 0.9 % injection 10 mL, 10 mL, Intracatheter, PRN, Africa Masaki, MD,  10 mL at 04/30/23 1247   Allergies: No Known Allergies  REVIEW OF SYSTEMS:   Review of Systems  Constitutional:  Negative for chills, fatigue and fever.  HENT:   Negative for lump/mass, mouth sores, nosebleeds, sore throat and trouble swallowing.   Eyes:  Positive for eye problems.  Respiratory:  Positive for shortness of breath. Negative for cough.   Cardiovascular:  Negative for chest pain, leg swelling and palpitations.  Gastrointestinal:  Negative for abdominal pain, constipation, diarrhea, nausea and vomiting.  Genitourinary:  Negative for bladder incontinence, difficulty urinating, dysuria, frequency, hematuria and nocturia.   Musculoskeletal:  Negative for arthralgias, back pain, flank pain, myalgias and neck pain.  Skin:  Negative for itching and rash.  Neurological:  Positive  for numbness. Negative for dizziness and headaches.  Hematological:  Does not bruise/bleed easily.  Psychiatric/Behavioral:  Negative for depression, sleep disturbance and suicidal ideas. The patient is not nervous/anxious.   All other systems reviewed and are negative.    VITALS:   Blood pressure (!) 113/54, pulse 93, temperature 98 F (36.7 C), temperature source Oral, resp. rate 20, height 5\' 2"  (1.575 m), weight 216 lb (98 kg), SpO2 97%.  Wt Readings from Last 3 Encounters:  04/30/23 216 lb (98 kg)  04/18/23 216 lb 14.9 oz (98.4 kg)  04/16/23 216 lb 14.9 oz (98.4 kg)    Body mass index is 39.51 kg/m.  Performance status (ECOG): 1 - Symptomatic but completely ambulatory  PHYSICAL EXAM:   Physical Exam Vitals and nursing note reviewed. Exam conducted with a chaperone present.  Constitutional:      Appearance: Normal appearance.  Cardiovascular:     Rate and Rhythm: Normal rate and regular rhythm.     Pulses: Normal pulses.     Heart sounds: Normal heart sounds.  Pulmonary:     Effort: Pulmonary effort is normal.     Breath sounds: Normal breath sounds.  Abdominal:     Palpations:  Abdomen is soft. There is no hepatomegaly, splenomegaly or mass.     Tenderness: There is no abdominal tenderness.  Musculoskeletal:     Right lower leg: Edema present.     Left lower leg: Edema present.  Lymphadenopathy:     Cervical: No cervical adenopathy.     Right cervical: No superficial, deep or posterior cervical adenopathy.    Left cervical: No superficial, deep or posterior cervical adenopathy.     Upper Body:     Right upper body: No supraclavicular or axillary adenopathy.     Left upper body: No supraclavicular or axillary adenopathy.  Neurological:     General: No focal deficit present.     Mental Status: She is alert and oriented to person, place, and time.  Psychiatric:        Mood and Affect: Mood normal.        Behavior: Behavior normal.     LABS:   CBC     Component Value Date/Time   WBC 6.6 04/30/2023 0936   RBC 3.22 (L) 04/30/2023 0936   HGB 9.1 (L) 04/30/2023 0936   HCT 31.4 (L) 04/30/2023 0936   PLT 310 04/30/2023 0936   MCV 97.5 04/30/2023 0936   MCH 28.3 04/30/2023 0936   MCHC 29.0 (L) 04/30/2023 0936   RDW 17.6 (H) 04/30/2023 0936   LYMPHSABS 1.0 04/30/2023 0936   MONOABS 0.8 04/30/2023 0936   EOSABS 0.1 04/30/2023 0936   BASOSABS 0.0 04/30/2023 0936    CMP      Component Value Date/Time   NA 142 04/30/2023 0936   K 3.7 04/30/2023 0936   CL 106 04/30/2023 0936   CO2 27 04/30/2023 0936   GLUCOSE 107 (H) 04/30/2023 0936   BUN 10 04/30/2023 0936   CREATININE 0.93 04/30/2023 0936   CALCIUM  8.7 (L) 04/30/2023 0936   PROT 5.9 (L) 04/30/2023 0936   ALBUMIN 2.7 (L) 04/30/2023 0936   AST 18 04/30/2023 0936   ALT 13 04/30/2023 0936   ALKPHOS 63 04/30/2023 0936   BILITOT 0.4 04/30/2023 0936   GFRNONAA >60 04/30/2023 0936   GFRAA >60 12/11/2019 0817     No results found for: "CEA1", "CEA" / No results found for: "CEA1", "CEA" No results found for: "PSA1" No results found for: "  AOZ308" No results found for: "CAN125"  No results found  for: "TOTALPROTELP", "ALBUMINELP", "A1GS", "A2GS", "BETS", "BETA2SER", "GAMS", "MSPIKE", "SPEI" Lab Results  Component Value Date   TIBC 241 (L) 12/20/2022   TIBC 215 (L) 09/18/2022   TIBC 227 (L) 06/05/2022   FERRITIN 715 (H) 12/20/2022   FERRITIN 596 (H) 09/18/2022   FERRITIN 310 (H) 06/05/2022   IRONPCTSAT 12 12/20/2022   IRONPCTSAT 15 09/18/2022   IRONPCTSAT 17 06/05/2022   Lab Results  Component Value Date   LDH 178 12/29/2022   LDH 185 12/25/2022   LDH 237 (H) 07/17/2022     STUDIES:   DG Chest 2 View Result Date: 04/24/2023 CLINICAL DATA:  Shortness of breath EXAM: CHEST - 2 VIEW COMPARISON:  Chest radiograph dated 01/11/2023. FINDINGS: Right-sided Port-A-Cath in similar position. Small bilateral pleural effusions and bibasilar atelectasis or infiltrate similar or slightly progressed on the right since the prior radiograph. Left upper lobe airspace opacity as seen previously. No pneumothorax. Stable cardiac silhouette. Atherosclerotic calcification of the aorta. No acute osseous pathology. Left shoulder arthroplasty. IMPRESSION: No significant interval change. Electronically Signed   By: Angus Bark M.D.   On: 04/24/2023 09:09   CT Angio Chest Pulmonary Embolism (PE) W or WO Contrast Result Date: 04/19/2023 CLINICAL DATA:  Follow-up pleural effusion on ultrasound EXAM: CT ANGIOGRAPHY CHEST WITH CONTRAST TECHNIQUE: Multidetector CT imaging of the chest was performed using the standard protocol during bolus administration of intravenous contrast. Multiplanar CT image reconstructions and MIPs were obtained to evaluate the vascular anatomy. RADIATION DOSE REDUCTION: This exam was performed according to the departmental dose-optimization program which includes automated exposure control, adjustment of the mA and/or kV according to patient size and/or use of iterative reconstruction technique. CONTRAST:  60mL OMNIPAQUE  IOHEXOL  350 MG/ML SOLN COMPARISON:  02/20/2023 FINDINGS:  Cardiovascular: Atherosclerotic calcifications of the thoracic aorta are noted. No aneurysmal dilatation is seen. The heart is mildly enlarged in size. The pulmonary artery demonstrates a normal branching pattern. No intraluminal filling defect to suggest pulmonary embolism is noted. Right chest wall port is noted. Mediastinum/Nodes: Thoracic inlet is within normal limits. No hilar or mediastinal adenopathy is noted. The esophagus as visualized is within normal limits. Lungs/Pleura: Multiloculated right-sided pleural effusion is noted extending into the minor fissure. Mild right lower lobe atelectasis is noted. Smaller left-sided effusion is seen with persistent radiation induced consolidation stable from the prior exam. No acute parenchymal nodule is seen. Some mosaic attenuation is again seen consistent with air trapping. Upper Abdomen: Gallbladder has been surgically removed. Changes of prior gastric bypass surgery are seen. Previously seen hepatic lesion is not well appreciated on this exam due to the timing of the contrast bolus. Musculoskeletal: Degenerative change of the thoracic spine is noted. Review of the MIP images confirms the above findings. IMPRESSION: No evidence of pulmonary emboli. Bilateral pleural effusions right greater than left. Stable post treatment consolidation is noted on the left. Mild right basilar atelectasis is seen. Aortic Atherosclerosis (ICD10-I70.0). Electronically Signed   By: Violeta Grey M.D.   On: 04/19/2023 02:04   US  CHEST (PLEURAL EFFUSION) Result Date: 04/18/2023 CLINICAL DATA:  Lung cancer, small pleural effusions by chest x-ray, assess thoracentesis EXAM: CHEST ULTRASOUND COMPARISON:  04/16/2023, 02/20/2023 FINDINGS: Ultrasound performed of the posterior chest with the patient upright to assess for any significant pleural effusion. No significant left pleural effusion by ultrasound. Only trace right pleural effusion noted. There is not enough pleural fluid to warrant  thoracentesis. Procedure not performed. IMPRESSION: Only  trace right pleural effusion by ultrasound.  See above comment. Electronically Signed   By: Melven Stable.  Shick M.D.   On: 04/18/2023 14:10

## 2023-04-30 ENCOUNTER — Inpatient Hospital Stay: Payer: Medicare Other

## 2023-04-30 ENCOUNTER — Inpatient Hospital Stay (HOSPITAL_BASED_OUTPATIENT_CLINIC_OR_DEPARTMENT_OTHER): Payer: Medicare Other | Attending: Neurosurgery | Admitting: Hematology

## 2023-04-30 ENCOUNTER — Inpatient Hospital Stay: Payer: Medicare Other | Attending: Neurosurgery

## 2023-04-30 VITALS — BP 111/61 | HR 88 | Temp 98.4°F | Resp 18

## 2023-04-30 VITALS — BP 113/54 | HR 93 | Temp 98.0°F | Resp 20 | Ht 62.0 in | Wt 216.0 lb

## 2023-04-30 DIAGNOSIS — C3492 Malignant neoplasm of unspecified part of left bronchus or lung: Secondary | ICD-10-CM | POA: Diagnosis not present

## 2023-04-30 DIAGNOSIS — C787 Secondary malignant neoplasm of liver and intrahepatic bile duct: Secondary | ICD-10-CM | POA: Diagnosis not present

## 2023-04-30 DIAGNOSIS — Z5111 Encounter for antineoplastic chemotherapy: Secondary | ICD-10-CM | POA: Insufficient documentation

## 2023-04-30 DIAGNOSIS — I2699 Other pulmonary embolism without acute cor pulmonale: Secondary | ICD-10-CM | POA: Diagnosis not present

## 2023-04-30 DIAGNOSIS — D329 Benign neoplasm of meninges, unspecified: Secondary | ICD-10-CM | POA: Diagnosis not present

## 2023-04-30 DIAGNOSIS — D649 Anemia, unspecified: Secondary | ICD-10-CM | POA: Diagnosis not present

## 2023-04-30 DIAGNOSIS — Z7901 Long term (current) use of anticoagulants: Secondary | ICD-10-CM | POA: Insufficient documentation

## 2023-04-30 DIAGNOSIS — C3412 Malignant neoplasm of upper lobe, left bronchus or lung: Secondary | ICD-10-CM | POA: Insufficient documentation

## 2023-04-30 LAB — CBC WITH DIFFERENTIAL/PLATELET
Abs Immature Granulocytes: 0.01 10*3/uL (ref 0.00–0.07)
Basophils Absolute: 0 10*3/uL (ref 0.0–0.1)
Basophils Relative: 1 %
Eosinophils Absolute: 0.1 10*3/uL (ref 0.0–0.5)
Eosinophils Relative: 2 %
HCT: 31.4 % — ABNORMAL LOW (ref 36.0–46.0)
Hemoglobin: 9.1 g/dL — ABNORMAL LOW (ref 12.0–15.0)
Immature Granulocytes: 0 %
Lymphocytes Relative: 15 %
Lymphs Abs: 1 10*3/uL (ref 0.7–4.0)
MCH: 28.3 pg (ref 26.0–34.0)
MCHC: 29 g/dL — ABNORMAL LOW (ref 30.0–36.0)
MCV: 97.5 fL (ref 80.0–100.0)
Monocytes Absolute: 0.8 10*3/uL (ref 0.1–1.0)
Monocytes Relative: 12 %
Neutro Abs: 4.7 10*3/uL (ref 1.7–7.7)
Neutrophils Relative %: 70 %
Platelets: 310 10*3/uL (ref 150–400)
RBC: 3.22 MIL/uL — ABNORMAL LOW (ref 3.87–5.11)
RDW: 17.6 % — ABNORMAL HIGH (ref 11.5–15.5)
WBC: 6.6 10*3/uL (ref 4.0–10.5)
nRBC: 0 % (ref 0.0–0.2)

## 2023-04-30 LAB — COMPREHENSIVE METABOLIC PANEL
ALT: 13 U/L (ref 0–44)
AST: 18 U/L (ref 15–41)
Albumin: 2.7 g/dL — ABNORMAL LOW (ref 3.5–5.0)
Alkaline Phosphatase: 63 U/L (ref 38–126)
Anion gap: 9 (ref 5–15)
BUN: 10 mg/dL (ref 8–23)
CO2: 27 mmol/L (ref 22–32)
Calcium: 8.7 mg/dL — ABNORMAL LOW (ref 8.9–10.3)
Chloride: 106 mmol/L (ref 98–111)
Creatinine, Ser: 0.93 mg/dL (ref 0.44–1.00)
GFR, Estimated: >60 mL/min (ref >60)
Glucose, Bld: 107 mg/dL — ABNORMAL HIGH (ref 70–99)
Potassium: 3.7 mmol/L (ref 3.5–5.1)
Sodium: 142 mmol/L (ref 135–145)
Total Bilirubin: 0.4 mg/dL (ref 0.0–1.2)
Total Protein: 5.9 g/dL — ABNORMAL LOW (ref 6.5–8.1)

## 2023-04-30 LAB — MAGNESIUM: Magnesium: 1.7 mg/dL (ref 1.7–2.4)

## 2023-04-30 MED ORDER — HEPARIN SOD (PORK) LOCK FLUSH 100 UNIT/ML IV SOLN
500.0000 [IU] | Freq: Once | INTRAVENOUS | Status: AC | PRN
  Administered 2023-04-30: 500 [IU]

## 2023-04-30 MED ORDER — SODIUM CHLORIDE 0.9% FLUSH
10.0000 mL | Freq: Once | INTRAVENOUS | Status: AC
  Administered 2023-04-30: 10 mL via INTRAVENOUS

## 2023-04-30 MED ORDER — SODIUM CHLORIDE 0.9 % IV SOLN: Freq: Once | INTRAVENOUS | Status: AC

## 2023-04-30 MED ORDER — SODIUM CHLORIDE 0.9 % IV SOLN
400.0000 mg/m2 | Freq: Once | INTRAVENOUS | Status: AC
  Administered 2023-04-30: 800 mg via INTRAVENOUS
  Filled 2023-04-30: qty 20

## 2023-04-30 MED ORDER — CIPROFLOXACIN HCL 0.3 % OP SOLN: 1.0000 [drp] | OPHTHALMIC | 0 refills | Status: DC

## 2023-04-30 MED ORDER — SODIUM CHLORIDE 0.9% FLUSH
10.0000 mL | INTRAVENOUS | Status: DC | PRN
Start: 2023-04-30 — End: 2023-06-11
  Administered 2023-04-30: 10 mL

## 2023-04-30 MED ORDER — ONDANSETRON HCL 4 MG/2ML IJ SOLN
8.0000 mg | Freq: Once | INTRAMUSCULAR | Status: AC
  Administered 2023-04-30: 8 mg via INTRAVENOUS
  Filled 2023-04-30: qty 4

## 2023-04-30 MED ORDER — RIVAROXABAN 20 MG PO TABS: 20.0000 mg | ORAL_TABLET | Freq: Every day | ORAL | 3 refills | Status: DC

## 2023-04-30 MED ORDER — CYANOCOBALAMIN 1000 MCG/ML IJ SOLN
1000.0000 ug | Freq: Once | INTRAMUSCULAR | Status: AC
  Administered 2023-04-30: 1000 ug via INTRAMUSCULAR
  Filled 2023-04-30: qty 1

## 2023-04-30 NOTE — Progress Notes (Signed)
 Patient has been examined by Dr. Ellin Saba. Vital signs and labs have been reviewed by MD - ANC, Creatinine, LFTs, hemoglobin, and platelets are within treatment parameters per M.D. - pt may proceed with treatment.  Primary RN and pharmacy notified.

## 2023-04-30 NOTE — Progress Notes (Signed)
 Labs reviewed with MD today in office visit. Ok to treat per Dr. Ellin Saba.

## 2023-04-30 NOTE — Patient Instructions (Signed)

## 2023-05-01 ENCOUNTER — Encounter (HOSPITAL_COMMUNITY): Payer: Self-pay

## 2023-05-01 ENCOUNTER — Ambulatory Visit (HOSPITAL_COMMUNITY): Payer: Medicare Other

## 2023-05-01 DIAGNOSIS — S81802S Unspecified open wound, left lower leg, sequela: Secondary | ICD-10-CM

## 2023-05-01 DIAGNOSIS — R601 Generalized edema: Secondary | ICD-10-CM | POA: Diagnosis not present

## 2023-05-01 DIAGNOSIS — M79605 Pain in left leg: Secondary | ICD-10-CM | POA: Diagnosis not present

## 2023-05-01 NOTE — Therapy (Signed)
OUTPATIENT PHYSICAL THERAPY Wound TREATMENT/Discharge Patient Name: Denise [name] MRN: [id number] DOB:07/09/1950, 73 y.o., female Today's Date: 05/01/2023 PHYSICAL THERAPY DISCHARGE SUMMARY  Visits from Start of Care: 23  Current functional level related to goals / functional outcomes: PT has no wounds, edema is controlled, has compression garments.    Remaining deficits: Difficulty donning    Education / Equipment: Charm Barges   Patient agrees to discharge. Patient goals were met. Patient is being discharged due to meeting the stated rehab goals.     PCP: Mirna Mires, MD REFERRING PROVIDER: Mirna Mires, MD  END OF SESSION:   PT End of Session - 05/01/23 1135     Visit Number 23    Number of Visits 23    Date for PT Re-Evaluation 05/22/23    Authorization Type new insurance as of 2025 [facility]; request submitted and no auth needed    Authorization Time Period 16 from 01/05-->05/17/23    Authorization - Visit Number 9    Authorization - Number of Visits 16    Progress Note Due on Visit 26    PT Start Time 1020    PT Stop Time 1058    PT Time Calculation (min) 38 min    Equipment Utilized During Treatment Oxygen    Activity Tolerance Patient tolerated treatment well    Behavior During Therapy WFL for tasks assessed/performed                  Past Medical History:  Diagnosis Date   Anemia    Aortic stenosis    Arthritis    Coronary artery calcification seen on CT scan    Dyspnea    Essential hypertension    GERD (gastroesophageal reflux disease)    History of lung cancer    Stage III adenocarcinoma status post chemoradiation   Port-A-Cath in place 01/06/2019   Type 2 diabetes mellitus (HCC)    Past Surgical History:  Procedure Laterality Date   BIOPSY  09/25/2022   Procedure: BIOPSY;  Surgeon: Lanelle Bal, DO;  [facility]: [facility];  Service: Endoscopy;;   CHOLECYSTECTOMY  1997   COLONOSCOPY     COLONOSCOPY WITH PROPOFOL N/A 09/25/2022   Procedure:  COLONOSCOPY WITH PROPOFOL;  Surgeon: Lanelle Bal, DO;  [facility]: [facility];  Service: Endoscopy;  Laterality: N/A;  11:00;ASA 3   ESOPHAGEAL BRUSHING  09/25/2022   Procedure: ESOPHAGEAL BRUSHING;  Surgeon: Lanelle Bal, DO;  [facility];  Service: Endoscopy;;   ESOPHAGOGASTRODUODENOSCOPY (EGD) WITH PROPOFOL N/A 09/25/2022   Procedure: ESOPHAGOGASTRODUODENOSCOPY (EGD) WITH PROPOFOL;  Surgeon: Lanelle Bal, DO;  [facility];  Service: Endoscopy;  Laterality: N/A;  11:00AM;ASA 3   GASTRIC BYPASS     INCISIONAL HERNIA REPAIR  04/11/11   IR IMAGING GUIDED PORT INSERTION  12/27/2018   Right   IR US GUIDE BX ASP/DRAIN  08/08/2021   LAPAROSCOPIC SALPINGOOPHERECTOMY     LAPAROTOMY  04/11/2011   Procedure: EXPLORATORY LAPAROTOMY;  Surgeon: Clovis Pu. Cornett, MD;  Location: [facility];  Service: General;  Laterality: N/A;  closure port hole   POLYPECTOMY  09/25/2022   Procedure: POLYPECTOMY;  Surgeon: Lanelle Bal, DO;  [facility]: [facility];  Service: Endoscopy;;   RIGHT/LEFT HEART CATH AND CORONARY ANGIOGRAPHY N/A 12/29/2019   Procedure: RIGHT/LEFT HEART CATH AND CORONARY ANGIOGRAPHY;  Surgeon: Lennette Bihari, MD;  Location: [facility];  Service: Cardiovascular;  Laterality: N/A;   TOTAL HIP ARTHROPLASTY  03/07/2012  Procedure: TOTAL HIP ARTHROPLASTY ANTERIOR APPROACH;  Surgeon: Shelda Pal, MD;  Location: [facility];  Service: Orthopedics;  Laterality: Right;   TOTAL SHOULDER ARTHROPLASTY Left 01/21/2015   TOTAL SHOULDER ARTHROPLASTY Left 01/21/2015   Procedure: LEFT TOTAL SHOULDER ARTHROPLASTY;  Surgeon: Francena Hanly, MD;  Location: [facility];  Service: Orthopedics;  Laterality: Left;   VAGINAL HYSTERECTOMY     Patient Active Problem List   Diagnosis Date Noted   Colitis 01/11/2023   Pneumonia of left lower lobe due to infectious organism 12/28/2022   Acute respiratory failure with hypoxia (HCC) 12/28/2022   Loculated pleural effusion 12/28/2022   Acute  on chronic respiratory failure with hypoxia (HCC) 12/24/2022   Pleural effusion, left 12/24/2022   Multifocal pneumonia 12/24/2022   Atrial fibrillation with RVR (HCC) 12/24/2022   Severe sepsis (HCC) 12/24/2022   Iron deficiency anemia 09/02/2022   Early satiety 09/02/2022   Intramuscular hematoma 08/03/2021   Pulmonary embolus (HCC) 07/17/2021   Type 2 diabetes mellitus (HCC)    Essential hypertension    History of lung cancer    Class 3 obesity    Hypokalemia    Malignant neoplasm metastatic to brain (HCC) 06/04/2020   Meningioma, cerebral (HCC) 05/18/2020   Abnormal myocardial perfusion study    Aortic stenosis    DOE (dyspnea on exertion) 10/09/2019   Meningioma (HCC) 03/20/2019   Port-A-Cath in place 01/06/2019   Adenocarcinoma of left lung (HCC) 12/09/2018   S/P shoulder replacement 01/21/2015   Expected blood loss anemia 03/08/2012   Morbid obesity with BMI of 45.0-49.9, adult (HCC) 03/08/2012   S/P right THA, AA 03/07/2012   Small bowel obstruction (HCC) 04/11/2011   Incisional hernia with obstruction 04/11/2011   Pelvic mass 03/29/2011   Radicular low back pain 11/03/2010   Hip arthritis 11/03/2010    ONSET DATE: 12/28/22  REFERRING DIAG:    Z30.865H (ICD-10-CM) - Unspecified open wound, left lower leg, initial encounter  R60.0 (ICD-10-CM) - Localized edema  C34.92 (ICD-10-CM) - Malignant neoplasm of unspecified part of left bronchus or lung    THERAPY DIAG:  S81.802; open wounds of left LE, Rt LE wounds  Lt LE pain   Wound Therapy - 05/01/23 0001     Subjective Reports she has been walking more.  Arrived with compression garment on Rt and profore on Lt, stated family member has been coming over daily to change for her.    Patient and Family Stated Goals wounds to heal    Date of Onset --   chronic   Prior Treatments antibiotic, self care, lasix    Pain Scale 0-10    Pain Score 0-No pain    Evaluation and Treatment Procedures Explained to Patient/Family  Yes    Evaluation and Treatment Procedures agreed to    Wound Properties Date First Assessed: 03/05/23 Time First Assessed: 1205 Location: Pretibial Location Orientation: Left;Proximal Wound Description (Comments): Left LE, blistering and weeping   Wound Image Images linked: 1    Dressing Type --   compression garment donned by therapist, family member assisted with butler at home   Wound Length (cm) 0 cm    Wound Width (cm) 0 cm    Wound Surface Area (cm^2) 0 cm^2    Drainage Amount None    Treatment Cleansed    Wound Therapy - Clinical Statement see below    Factors Delaying/Impairing Wound Healing Vascular compromise    Hydrotherapy Plan Debridement;Dressing change    Wound Therapy - Frequency 3X / week  Wound Therapy - Current Recommendations PT    Wound Plan see for debridement, manual and dressing change    Dressing  BLE compression garments                  PATIENT EDUCATION: Education details: Keep bandage on unless it becomes wet or painful  Person educated: Patient Education method: Explanation Education comprehension: returned demonstration   HOME EXERCISE PROGRAM: Ankle pumps, LAQ, hip ab/adduction, marching    GOALS: Goals reviewed with patient? No  SHORT TERM GOALS: Target date: 02/28/23  Pt wound to be 100% granulated  Baseline: Goal status: on going   2.  PT wounds to no longer be weeping  Baseline:  Goal status: MET  3.  Pt pain to be no greater than a 5/10 Baseline:  Goal status: MET   LONG TERM GOALS: Target date: 03/22/22  PT to have no pain in her  LE's Baseline:  Goal status: MET  2.  PT wound to be healed  Baseline: 03/02/23: Lt LE has healed, continue with Rt LE.   Goal status: IN PROGRESS  3.  Pt to verbalize that she understands the importance of wearing compression garment, completing her exercises  Baseline: 12/123/24:  Pt wearing compression garment with Lt LE. Goal status: MET   ASSESSMENT:  CLINICAL  IMPRESSION: Upon removal of dressings all wounds fully healed, no skilled intervention needed.  Reviewed self care and importance of skin integrity, exercise and compression for edema control.  Pt has been wearing compression garments 24 hours, removes for shower and to apply lotion prior family member donning compression garment due to wounds reappearing at night.  Pt will benefit from additional juxtafit for increased ease donning independently, night sleeve to assure wounds do not appear at night and pump.  Called [facility] today following exercise and sent 2nd notice referral to referring MD.   OBJECTIVE IMPAIRMENTS: decreased activity tolerance, difficulty walking, increased edema, and pain.   ACTIVITY LIMITATIONS: dressing, hygiene/grooming, and locomotion level  PARTICIPATION LIMITATIONS: community activity  PERSONAL FACTORS: Time since onset of injury/illness/exacerbation are also affecting patient's functional outcome. Type 2 diabetes mellitus (HCC), Metastatic adenocarcinoma of the lung,    Essential hypertensiChronic venous stasis edema BLEs - Right greater than left   Class 3 obesity   Atrial fibrillation with RVR (HCC  REHAB POTENTIAL: Good  CLINICAL DECISION MAKING: Stable/uncomplicated  EVALUATION COMPLEXITY: Moderate  PLAN: PT FREQUENCY: 2x/week  PT DURATION: 8 weeks  PLANNED INTERVENTIONS: 97110-Therapeutic exercises, 97535- Self Care, 60454- Manual therapy, Patient/Family education, Manual lymph drainage, Compression bandaging, and wound care  to include debridement.    PLAN FOR NEXT SESSION:  DC to self care.      Becky Sax, LPTA/CLT; [place], PT CLT [phone]  [phone]  05/01/2023, 11:38 AM   05/01/2023, 11:38 AM

## 2023-05-03 ENCOUNTER — Ambulatory Visit (HOSPITAL_COMMUNITY): Payer: Medicare Other

## 2023-05-06 DIAGNOSIS — T148XXA Other injury of unspecified body region, initial encounter: Secondary | ICD-10-CM | POA: Diagnosis not present

## 2023-05-07 ENCOUNTER — Inpatient Hospital Stay: Payer: Medicare Other

## 2023-05-07 ENCOUNTER — Inpatient Hospital Stay: Payer: Medicare Other | Admitting: Hematology

## 2023-05-07 DIAGNOSIS — H04123 Dry eye syndrome of bilateral lacrimal glands: Secondary | ICD-10-CM | POA: Diagnosis not present

## 2023-05-08 ENCOUNTER — Ambulatory Visit (HOSPITAL_COMMUNITY): Payer: Medicare Other | Admitting: Physical Therapy

## 2023-05-10 ENCOUNTER — Ambulatory Visit (HOSPITAL_COMMUNITY): Payer: Medicare Other | Admitting: Physical Therapy

## 2023-05-10 ENCOUNTER — Other Ambulatory Visit (HOSPITAL_COMMUNITY): Payer: Self-pay

## 2023-05-10 DIAGNOSIS — I2699 Other pulmonary embolism without acute cor pulmonale: Secondary | ICD-10-CM | POA: Diagnosis not present

## 2023-05-10 DIAGNOSIS — T148XXA Other injury of unspecified body region, initial encounter: Secondary | ICD-10-CM | POA: Diagnosis not present

## 2023-05-10 DIAGNOSIS — E119 Type 2 diabetes mellitus without complications: Secondary | ICD-10-CM | POA: Diagnosis not present

## 2023-05-10 DIAGNOSIS — I10 Essential (primary) hypertension: Secondary | ICD-10-CM | POA: Diagnosis not present

## 2023-05-12 NOTE — Progress Notes (Signed)
 Updated pemetrexed ERX to [id number]   Pryor Ochoa, PharmD 05/12/23

## 2023-05-15 ENCOUNTER — Ambulatory Visit (HOSPITAL_COMMUNITY): Payer: Medicare Other | Admitting: Physical Therapy

## 2023-05-17 ENCOUNTER — Ambulatory Visit (HOSPITAL_COMMUNITY): Payer: Medicare Other | Admitting: Physical Therapy

## 2023-05-18 DIAGNOSIS — I890 Lymphedema, not elsewhere classified: Secondary | ICD-10-CM | POA: Diagnosis not present

## 2023-05-18 DIAGNOSIS — E119 Type 2 diabetes mellitus without complications: Secondary | ICD-10-CM | POA: Diagnosis not present

## 2023-05-18 DIAGNOSIS — I2699 Other pulmonary embolism without acute cor pulmonale: Secondary | ICD-10-CM | POA: Diagnosis not present

## 2023-05-18 DIAGNOSIS — I10 Essential (primary) hypertension: Secondary | ICD-10-CM | POA: Diagnosis not present

## 2023-05-18 DIAGNOSIS — T148XXA Other injury of unspecified body region, initial encounter: Secondary | ICD-10-CM | POA: Diagnosis not present

## 2023-05-21 ENCOUNTER — Inpatient Hospital Stay: Payer: Medicare Other | Attending: Neurosurgery

## 2023-05-21 ENCOUNTER — Inpatient Hospital Stay (HOSPITAL_BASED_OUTPATIENT_CLINIC_OR_DEPARTMENT_OTHER): Payer: Medicare Other | Admitting: Hematology

## 2023-05-21 ENCOUNTER — Inpatient Hospital Stay: Payer: Medicare Other

## 2023-05-21 VITALS — BP 108/70 | HR 92 | Temp 96.7°F | Resp 20

## 2023-05-21 DIAGNOSIS — C3412 Malignant neoplasm of upper lobe, left bronchus or lung: Secondary | ICD-10-CM | POA: Diagnosis present

## 2023-05-21 DIAGNOSIS — Z87891 Personal history of nicotine dependence: Secondary | ICD-10-CM | POA: Diagnosis not present

## 2023-05-21 DIAGNOSIS — Z7901 Long term (current) use of anticoagulants: Secondary | ICD-10-CM | POA: Diagnosis not present

## 2023-05-21 DIAGNOSIS — D329 Benign neoplasm of meninges, unspecified: Secondary | ICD-10-CM | POA: Diagnosis not present

## 2023-05-21 DIAGNOSIS — D649 Anemia, unspecified: Secondary | ICD-10-CM | POA: Diagnosis not present

## 2023-05-21 DIAGNOSIS — C3492 Malignant neoplasm of unspecified part of left bronchus or lung: Secondary | ICD-10-CM

## 2023-05-21 DIAGNOSIS — Z86711 Personal history of pulmonary embolism: Secondary | ICD-10-CM | POA: Insufficient documentation

## 2023-05-21 DIAGNOSIS — Z79899 Other long term (current) drug therapy: Secondary | ICD-10-CM | POA: Insufficient documentation

## 2023-05-21 DIAGNOSIS — Z5111 Encounter for antineoplastic chemotherapy: Secondary | ICD-10-CM | POA: Insufficient documentation

## 2023-05-21 LAB — CBC WITH DIFFERENTIAL/PLATELET
Abs Immature Granulocytes: 0.01 10*3/uL (ref 0.00–0.07)
Basophils Absolute: 0 10*3/uL (ref 0.0–0.1)
Basophils Relative: 1 %
Eosinophils Absolute: 0.1 10*3/uL (ref 0.0–0.5)
Eosinophils Relative: 1 %
HCT: 30 % — ABNORMAL LOW (ref 36.0–46.0)
Hemoglobin: 8.9 g/dL — ABNORMAL LOW (ref 12.0–15.0)
Immature Granulocytes: 0 %
Lymphocytes Relative: 17 %
Lymphs Abs: 1.1 10*3/uL (ref 0.7–4.0)
MCH: 29 pg (ref 26.0–34.0)
MCHC: 29.7 g/dL — ABNORMAL LOW (ref 30.0–36.0)
MCV: 97.7 fL (ref 80.0–100.0)
Monocytes Absolute: 0.6 10*3/uL (ref 0.1–1.0)
Monocytes Relative: 9 %
Neutro Abs: 4.7 10*3/uL (ref 1.7–7.7)
Neutrophils Relative %: 72 %
Platelets: 489 10*3/uL — ABNORMAL HIGH (ref 150–400)
RBC: 3.07 MIL/uL — ABNORMAL LOW (ref 3.87–5.11)
RDW: 16.2 % — ABNORMAL HIGH (ref 11.5–15.5)
WBC: 6.5 10*3/uL (ref 4.0–10.5)
nRBC: 0 % (ref 0.0–0.2)

## 2023-05-21 LAB — COMPREHENSIVE METABOLIC PANEL
ALT: 12 U/L (ref 0–44)
AST: 18 U/L (ref 15–41)
Albumin: 2.4 g/dL — ABNORMAL LOW (ref 3.5–5.0)
Alkaline Phosphatase: 75 U/L (ref 38–126)
Anion gap: 10 (ref 5–15)
BUN: 9 mg/dL (ref 8–23)
CO2: 28 mmol/L (ref 22–32)
Calcium: 8.7 mg/dL — ABNORMAL LOW (ref 8.9–10.3)
Chloride: 103 mmol/L (ref 98–111)
Creatinine, Ser: 0.79 mg/dL (ref 0.44–1.00)
GFR, Estimated: >60 mL/min (ref >60)
Glucose, Bld: 111 mg/dL — ABNORMAL HIGH (ref 70–99)
Potassium: 3.6 mmol/L (ref 3.5–5.1)
Sodium: 141 mmol/L (ref 135–145)
Total Bilirubin: 0.5 mg/dL (ref 0.0–1.2)
Total Protein: 6.1 g/dL — ABNORMAL LOW (ref 6.5–8.1)

## 2023-05-21 LAB — TSH: TSH: 1.412 u[IU]/mL (ref 0.350–4.500)

## 2023-05-21 LAB — MAGNESIUM: Magnesium: 1.7 mg/dL (ref 1.7–2.4)

## 2023-05-21 MED ORDER — SODIUM CHLORIDE 0.9 % IV SOLN
400.0000 mg/m2 | Freq: Once | INTRAVENOUS | Status: AC
  Administered 2023-05-21: 800 mg via INTRAVENOUS
  Filled 2023-05-21: qty 20

## 2023-05-21 MED ORDER — SODIUM CHLORIDE 0.9% FLUSH
10.0000 mL | INTRAVENOUS | Status: DC | PRN
  Administered 2023-05-21: 10 mL

## 2023-05-21 MED ORDER — HEPARIN SOD (PORK) LOCK FLUSH 100 UNIT/ML IV SOLN
500.0000 [IU] | Freq: Once | INTRAVENOUS | Status: AC | PRN
  Administered 2023-05-21: 500 [IU]

## 2023-05-21 MED ORDER — ONDANSETRON HCL 4 MG/2ML IJ SOLN
8.0000 mg | Freq: Once | INTRAMUSCULAR | Status: AC
  Administered 2023-05-21: 8 mg via INTRAVENOUS
  Filled 2023-05-21: qty 4

## 2023-05-21 MED ORDER — SODIUM CHLORIDE 0.9 % IV SOLN: Freq: Once | INTRAVENOUS | Status: AC

## 2023-05-21 MED ORDER — SODIUM CHLORIDE 0.9% FLUSH
10.0000 mL | INTRAVENOUS | Status: AC
  Administered 2023-05-21: 10 mL

## 2023-05-21 NOTE — Progress Notes (Signed)
 Patient presents today for Alimta infusion per providers order.  Vital signs and labs reviewed by MD.  Messages received from Chapman Moss RN/Dr. Ellin Saba, patient okay for treatment.  Treatment given today per MD orders.  Stable during infusion without adverse affects.  Vital signs stable.  No complaints at this time.  Discharge from clinic ambulatory in stable condition.  Alert and oriented X 3.  Follow up with [facility] as scheduled.

## 2023-05-21 NOTE — Patient Instructions (Signed)
 [facility] - A DEPT OF [facility]  Discharge Instructions: Thank you for choosing [facility] to provide your oncology and hematology care.  If you have a lab appointment with the Cancer Center - please note that after April 8th, 2024, all labs will be drawn in the cancer center.  You do not have to check in or register with the main entrance as you have in the past but will complete your check-in in the cancer center.  Wear comfortable clothing and clothing appropriate for easy access to any Portacath or PICC line.   We strive to give you quality time with your provider. You may need to reschedule your appointment if you arrive late (15 or more minutes).  Arriving late affects you and other patients whose appointments are after yours.  Also, if you miss three or more appointments without notifying the office, you may be dismissed from the clinic at the provider's discretion.      For prescription refill requests, have your pharmacy contact our office and allow 72 hours for refills to be completed.    Today you received the following chemotherapy and/or immunotherapy agents Alimta      To help prevent nausea and vomiting after your treatment, we encourage you to take your nausea medication as directed.  BELOW ARE SYMPTOMS THAT SHOULD BE REPORTED IMMEDIATELY: *FEVER GREATER THAN 100.4 F (38 C) OR HIGHER *CHILLS OR SWEATING *NAUSEA AND VOMITING THAT IS NOT CONTROLLED WITH YOUR NAUSEA MEDICATION *UNUSUAL SHORTNESS OF BREATH *UNUSUAL BRUISING OR BLEEDING *URINARY PROBLEMS (pain or burning when urinating, or frequent urination) *BOWEL PROBLEMS (unusual diarrhea, constipation, pain near the anus) TENDERNESS IN MOUTH AND THROAT WITH OR WITHOUT PRESENCE OF ULCERS (sore throat, sores in mouth, or a toothache) UNUSUAL RASH, SWELLING OR PAIN  UNUSUAL VAGINAL DISCHARGE OR ITCHING   Items with * indicate a potential emergency and should be followed up as  soon as possible or go to the Emergency Department if any problems should occur.  Please show the CHEMOTHERAPY ALERT CARD or IMMUNOTHERAPY ALERT CARD at check-in to the Emergency Department and triage nurse.  Should you have questions after your visit or need to cancel or reschedule your appointment, please contact [facility] - A DEPT OF [facility] [phone]  and follow the prompts.  Office hours are 8:00 a.m. to 4:30 p.m. Monday - Friday. Please note that voicemails left after 4:00 p.m. may not be returned until the following business day.  We are closed weekends and major holidays. You have access to a nurse at all times for urgent questions. Please call the main number to the clinic [phone] and follow the prompts.  For any non-urgent questions, you may also contact your provider using MyChart. We now offer e-Visits for anyone 74 and older to request care online for non-urgent symptoms. For details visit [email or web address].   Also download the MyChart app! Go to the app store, search "MyChart", open the app, select [facility], and log in with your MyChart username and password.

## 2023-05-21 NOTE — Patient Instructions (Signed)

## 2023-05-21 NOTE — Progress Notes (Signed)
 [facility] [address] [place] [address]    Clinic Day:  05/21/2023  Referring physician: Mirna Mires, MD  Patient Care Team: Mirna Mires, MD as PCP - General (Family Medicine) Jonelle Sidle, MD as PCP - Cardiology (Cardiology) Doreatha Massed, MD as Medical Oncologist (Medical Oncology)   ASSESSMENT & PLAN:   Assessment: 1.  Adenocarcinoma of left lung (HCC) -Chemoradiation therapy with carboplatin and paclitaxel from 01/07/2019 through 02/11/2019. -Consolidation immunotherapy with durvalumab from 03/19/2019 through 04/15/2018, held due to pneumonitis. -CT chest on 07/02/2019 shows left upper lobe lung mass measuring 2.6 x 2.0 cm.  It shows improvement in size. -MRI of the brain on 07/18/2019 showed left sphenoid wing meningioma measuring 2.6 x 2.0 x 2.6 cm unchanged.  No new enhancing intracranial lesion.  Increased left temporal white matter edema. -Durvalumab restarted on 07/24/2019. -She was evaluated by cardiology with a stress test.  EF was 46%.  She underwent cardiac catheterization which did not show any abnormalities. - 1 year of durvalumab completed on 07/23/2020. - Liver mass biopsy (08/08/2021): Adenocarcinoma, CK7 positive, negative for TTF-1, Napsin a, GATA3, ER, CK20, CDX2 - PET scan (08/25/2021): Right upper lobe lung nodule 9 mm, SUV 5.0.  Groundglass and solid nodule periphery of the right upper lobe 8 mm, SUV 2.45.  Right lobe liver mass 4.5 x 3.6 cm, SUV 7.78. - NGS testing: PD-L1 negative, TMB-low, MSI-stable, K-ras G12 D.  No other targetable mutations. - Carboplatin, pemetrexed and pembrolizumab 4 cycles from 09/22/2021 through 11/25/2021, followed by maintenance pemetrexed and pembrolizumab.  Last pemetrexed and pembrolizumab on 11/30/2022.  Pembrolizumab discontinued due to colitis. -  CT CAP (11/10/2021): Reduced size of liver mass and interval resolution of previously seen groundglass pulmonary nodules.  There is a new inflammatory right  upper lobe nodule.    Plan: 1.  Metastatic adenocarcinoma of the lung to the liver and right lung: - CT angiogram chest (04/18/2022): No pulmonary embolism.  Bilateral pleural effusion, right more than left. - Ultrasound chest (04/18/2023): Trace right pleural effusion not adequate for thoracentesis. - She continues to report dyspnea on exertion.  She is on 2 L nasal cannula when resting and requires 4 L/min when walking. - She reportedly has an appointment to see Dr. Sherene Sires on 06/13/2023.  We will move it up if possible. - Labs today: Albumin low at 2.4.  Rest of the LFTs are normal.  Creatinine normal.  CBC grossly normal with stable anemia with hemoglobin 8.9.  TSH is normal. - Proceed with treatment today.  RTC 3 weeks for follow-up with repeat CT CAP with contrast for restaging.    2.  Meningioma: - MRI brain on 10/02/2022: Stable left sphenoid wing meningioma.  Will consider reimaging if she develops any new symptoms.  3.  Unprovoked pulmonary embolism: - Continue Eliquis 5 mg twice daily indefinitely.   4.  Leg swellings: - Continue Lasix 20 mg daily.   5.  Normocytic anemia: - Colonoscopy in 2024 showed nonbleeding internal hemorrhoids.  Hemoglobin today is 8.9, likely from myelosuppression.   6.  Hypomagnesemia: - Continue magnesium twice daily.  Magnesium is 1.7.    Orders Placed This Encounter  Procedures   CT CHEST ABDOMEN PELVIS W CONTRAST    Standing Status:   Future    Expected Date:   06/04/2023    Expiration Date:   05/20/2024    If indicated for the ordered procedure, I authorize the administration of contrast media per Radiology protocol:  Yes    Does the patient have a contrast media/X-ray dye allergy?:   No    Preferred imaging location?:   [facility]    If indicated for the ordered procedure, I authorize the administration of oral contrast media per Radiology protocol:   Yes   Magnesium    Standing Status:   Future    Expected Date:   09/24/2023     Expiration Date:   09/23/2024   CBC with Differential    Standing Status:   Future    Expected Date:   09/24/2023    Expiration Date:   09/23/2024   Comprehensive metabolic panel    Standing Status:   Future    Expected Date:   09/24/2023    Expiration Date:   09/23/2024   T4    Standing Status:   Future    Expected Date:   09/24/2023    Expiration Date:   09/23/2024   TSH    Standing Status:   Future    Expected Date:   09/24/2023    Expiration Date:   09/23/2024   Magnesium    Standing Status:   Future    Expected Date:   10/15/2023    Expiration Date:   10/14/2024   CBC with Differential    Standing Status:   Future    Expected Date:   10/15/2023    Expiration Date:   10/14/2024   Comprehensive metabolic panel    Standing Status:   Future    Expected Date:   10/15/2023    Expiration Date:   10/14/2024   Magnesium    Standing Status:   Future    Expected Date:   11/05/2023    Expiration Date:   11/04/2024   CBC with Differential    Standing Status:   Future    Expected Date:   11/05/2023    Expiration Date:   11/04/2024   Comprehensive metabolic panel    Standing Status:   Future    Expected Date:   11/05/2023    Expiration Date:   11/04/2024      Mikeal Hawthorne R Teague,acting as a scribe for Doreatha Massed, MD.,have documented all relevant documentation on the behalf of Doreatha Massed, MD,as directed by  Doreatha Massed, MD while in the presence of Doreatha Massed, MD.  I, Doreatha Massed MD, have reviewed the above documentation for accuracy and completeness, and I agree with the above.    Doreatha Massed, MD   3/3/20252:43 PM  CHIEF COMPLAINT:   Diagnosis: stage IV adenocarcinoma of the left lung    Cancer Staging  Adenocarcinoma of left lung [facility]) Staging form: Lung, AJCC 8th Edition - Clinical stage from 01/02/2019: Stage IIIB (cT3, cN2, cM0) - Signed by Doreatha Massed, MD on 01/02/2019 - Pathologic stage from 09/07/2021: Stage IVA (pTX, pNX, pM1b) -  Unsigned    Prior Therapy: 1. Chemoradiation with carboplatin and paclitaxel from 01/07/2019 to 02/11/2019. 2. Consolidation with durvalumab from 03/19/2019 to 04/16/2019, held due to pneumonitis.  Current Therapy:  Pemetrexed and pembrolizumab maintenance    HISTORY OF PRESENT ILLNESS:   Oncology History  Adenocarcinoma of left lung (HCC)  12/09/2018 Initial Diagnosis   Adenocarcinoma of left lung (HCC)   01/02/2019 Cancer Staging   Staging form: Lung, AJCC 8th Edition - Clinical stage from 01/02/2019: Stage IIIB (cT3, cN2, cM0) - Signed by Doreatha Massed, MD on 01/02/2019   01/07/2019 - 02/11/2019 Chemotherapy   The patient had palonosetron (ALOXI) injection 0.25  mg, 0.25 mg, Intravenous,  Once, 6 of 6 cycles Administration: 0.25 mg (01/07/2019), 0.25 mg (01/14/2019), 0.25 mg (01/21/2019), 0.25 mg (01/28/2019), 0.25 mg (02/04/2019), 0.25 mg (02/11/2019) CARBOplatin (PARAPLATIN) 270 mg in sodium chloride 0.9 % 250 mL chemo infusion, 270 mg (100 % of original dose 266.4 mg), Intravenous,  Once, 6 of 6 cycles Dose modification:   (original dose 266.4 mg, Cycle 1),   (original dose 266.4 mg, Cycle 2),   (original dose 266.4 mg, Cycle 3),   (original dose 266.4 mg, Cycle 4) Administration: 270 mg (01/07/2019), 270 mg (01/14/2019), 270 mg (01/21/2019), 270 mg (01/28/2019), 270 mg (02/04/2019), 270 mg (02/11/2019) PACLitaxel (TAXOL) 108 mg in sodium chloride 0.9 % 250 mL chemo infusion (</= 80mg /m2), 45 mg/m2 = 108 mg, Intravenous,  Once, 6 of 6 cycles Administration: 108 mg (01/07/2019), 108 mg (01/14/2019), 108 mg (01/21/2019), 108 mg (01/28/2019), 108 mg (02/04/2019), 108 mg (02/11/2019) fosaprepitant (EMEND) 150 mg, dexamethasone (DECADRON) 12 mg in sodium chloride 0.9 % 145 mL IVPB, , Intravenous,  Once, 5 of 5 cycles Administration:  (01/14/2019),  (01/21/2019),  (01/28/2019),  (02/04/2019),  (02/11/2019)  for chemotherapy treatment.    03/19/2019 - 07/23/2020 Chemotherapy   Patient  is on Treatment Plan : LUNG DURVALUMAB Q14D     09/22/2021 - 11/04/2021 Chemotherapy   Patient is on Treatment Plan : LUNG Carboplatin (5) + Pemetrexed (500) + Pembrolizumab (200) D1 q21d Induction x 4 cycles / Maintenance Pemetrexed (500) + Pembrolizumab (200) D1 q21d     09/22/2021 -  Chemotherapy   Patient is on Treatment Plan : LUNG Maintenance Alimta q 21 days        INTERVAL HISTORY:   Denise [name] is a 73 y.o. female presenting to clinic today for follow up of stage IV adenocarcinoma of the left lung. She was last seen by me on 04/30/23.  Today, she states that she is doing well overall. Her appetite level is at 100%. Her energy level is at 50%.  PAST MEDICAL HISTORY:   Past Medical History: Past Medical History:  Diagnosis Date   Anemia    Aortic stenosis    Arthritis    Coronary artery calcification seen on CT scan    Dyspnea    Essential hypertension    GERD (gastroesophageal reflux disease)    History of lung cancer    Stage III adenocarcinoma status post chemoradiation   Port-A-Cath in place 01/06/2019   Type 2 diabetes mellitus [facility])     Surgical History: Past Surgical History:  Procedure Laterality Date   BIOPSY  09/25/2022   Procedure: BIOPSY;  Surgeon: Lanelle Bal, DO;  [facility]: [facility];  Service: Endoscopy;;   CHOLECYSTECTOMY  1997   COLONOSCOPY     COLONOSCOPY WITH PROPOFOL N/A 09/25/2022   Procedure: COLONOSCOPY WITH PROPOFOL;  Surgeon: Lanelle Bal, DO;  Location: [facility];  Service: Endoscopy;  Laterality: N/A;  11:00;ASA 3   ESOPHAGEAL BRUSHING  09/25/2022   Procedure: ESOPHAGEAL BRUSHING;  Surgeon: Lanelle Bal, DO;  Location: [facility];  Service: Endoscopy;;   ESOPHAGOGASTRODUODENOSCOPY (EGD) WITH PROPOFOL N/A 09/25/2022   Procedure: ESOPHAGOGASTRODUODENOSCOPY (EGD) WITH PROPOFOL;  Surgeon: Lanelle Bal, DO;  Location: [facility];  Service: Endoscopy;  Laterality: N/A;  11:00AM;ASA 3   GASTRIC BYPASS     INCISIONAL HERNIA  REPAIR  04/11/11   IR IMAGING GUIDED PORT INSERTION  12/27/2018   Right   IR US GUIDE BX ASP/DRAIN  08/08/2021   LAPAROSCOPIC SALPINGOOPHERECTOMY  LAPAROTOMY  04/11/2011   Procedure: EXPLORATORY LAPAROTOMY;  Surgeon: Clovis Pu. Cornett, MD;  Location: [facility];  Service: General;  Laterality: N/A;  closure port hole   POLYPECTOMY  09/25/2022   Procedure: POLYPECTOMY;  Surgeon: Lanelle Bal, DO;  Location: [facility];  Service: Endoscopy;;   RIGHT/LEFT HEART CATH AND CORONARY ANGIOGRAPHY N/A 12/29/2019   Procedure: RIGHT/LEFT HEART CATH AND CORONARY ANGIOGRAPHY;  Surgeon: Lennette Bihari, MD;  Location: [facility];  Service: Cardiovascular;  Laterality: N/A;   TOTAL HIP ARTHROPLASTY  03/07/2012   Procedure: TOTAL HIP ARTHROPLASTY ANTERIOR APPROACH;  Surgeon: Shelda Pal, MD;  Location: [facility];  Service: Orthopedics;  Laterality: Right;   TOTAL SHOULDER ARTHROPLASTY Left 01/21/2015   TOTAL SHOULDER ARTHROPLASTY Left 01/21/2015   Procedure: LEFT TOTAL SHOULDER ARTHROPLASTY;  Surgeon: Francena Hanly, MD;  Location: [facility];  Service: Orthopedics;  Laterality: Left;   VAGINAL HYSTERECTOMY      Social History: Social History   Socioeconomic History   Marital status: Single    Spouse name: Not on file   Number of children: Not on file   Years of education: 12th grade   Highest education level: Not on file  Occupational History   Occupation: Employed    Employer: [employer]  Tobacco Use   Smoking status: Former    Current packs/day: 0.00    Average packs/day: 1 pack/day for 12.0 years (12.0 ttl pk-yrs)    Types: Cigarettes    Start date: 03/20/1964    Quit date: 03/20/1976    Years since quitting: 47.2    Passive exposure: Past   Smokeless tobacco: Never  Vaping Use   Vaping status: Never Used  Substance and Sexual Activity   Alcohol use: No   Drug use: No   Sexual activity: Not Currently  Other Topics Concern   Not on file  Social History Narrative   Not on  file   Social Drivers of Health   Financial Resource Strain: Low Risk  (12/06/2018)   Overall Financial Resource Strain (CARDIA)    Difficulty of Paying Living Expenses: Not very hard  Food Insecurity: No Food Insecurity (01/11/2023)   Hunger Vital Sign    Worried About Running Out of Food in the Last Year: Never true    Ran Out of Food in the Last Year: Never true  Transportation Needs: No Transportation Needs (01/11/2023)   PRAPARE - Administrator, Civil Service (Medical): No    Lack of Transportation (Non-Medical): No  Physical Activity: Inactive (12/06/2018)   Exercise Vital Sign    Days of Exercise per Week: 0 days    Minutes of Exercise per Session: 0 min  Stress: No Stress Concern Present (12/09/2018)   Received from [facility], [facility] of Occupational Health - Occupational Stress Questionnaire    Feeling of Stress : Not at all  Social Connections: Moderately Isolated (12/06/2018)   Social Connection and Isolation Panel [NHANES]    Frequency of Communication with Friends and Family: More than three times a week    Frequency of Social Gatherings with Friends and Family: Once a week    Attends Religious Services: More than 4 times per year    Active Member of [employer] or Organizations: No    Attends Banker Meetings: Never    Marital Status: Never married  Intimate Partner Violence: Not At Risk (01/11/2023)   Humiliation, Afraid, Rape, and Kick questionnaire  Fear of Current or Ex-Partner: No    Emotionally Abused: No    Physically Abused: No    Sexually Abused: No    Family History: Family History  Problem Relation Age of Onset   Breast cancer Mother    COPD Mother    Arthritis Mother    Diabetes Mother    Hypertension Mother    Hypertension Father    Diabetes Father    Breast cancer Sister    Thyroid cancer Brother    Heart attack Brother    Huntington's disease Maternal Grandmother     Current  Medications:  Current Outpatient Medications:    acetaminophen (TYLENOL) 500 MG tablet, Take 500 mg by mouth every 6 (six) hours as needed for moderate pain., Disp: , Rfl:    Calcium Carbonate Antacid (TUMS PO), Take 4 tablets by mouth daily., Disp: , Rfl:    carvedilol (COREG) 3.125 MG tablet, Take 1 tablet (3.125 mg total) by mouth 2 (two) times daily., Disp: 180 tablet, Rfl: 3   ciprofloxacin (CILOXAN) 0.3 % ophthalmic solution, Place 1 drop into both eyes every 4 (four) hours while awake. Administer 1 drop, every 2 hours, while awake, for 2 days. Then 1 drop, every 4 hours, while awake, for the next 5 days., Disp: 5 mL, Rfl: 0   Cyanocobalamin (VITAMIN B-12) 2500 MCG SUBL, Place 2,500 mcg under the tongue every morning. , Disp: , Rfl:    ferrous sulfate 325 (65 FE) MG tablet, Take 325 mg by mouth every evening., Disp: , Rfl:    folic acid (FOLVITE) 1 MG tablet, TAKE 1 TABLET BY MOUTH EVERY DAY, Disp: 90 tablet, Rfl: 1   gabapentin (NEURONTIN) 300 MG capsule, Take 300 mg by mouth daily., Disp: , Rfl:    gabapentin (NEURONTIN) 600 MG tablet, Take 600 mg by mouth at bedtime., Disp: , Rfl:    Glycerin-Hypromellose-PEG 400 (DRY EYE RELIEF DROPS OP), Apply 1 drop to eye daily as needed (dry eyes)., Disp: , Rfl:    loteprednol (LOTEMAX) 0.5 % ophthalmic suspension, 1 drop 2 (two) times daily., Disp: , Rfl:    magnesium oxide (MAG-OX) 400 (240 Mg) MG tablet, Take 1 tablet (400 mg total) by mouth 2 (two) times daily., Disp: 60 tablet, Rfl: 4   midodrine (PROAMATINE) 5 MG tablet, Take 1 tablet (5 mg total) by mouth 3 (three) times daily with meals., Disp: 90 tablet, Rfl: 5   Multiple Vitamin (MULITIVITAMIN WITH MINERALS) TABS, Take 1 tablet by mouth daily., Disp: , Rfl:    omeprazole (PRILOSEC) 20 MG capsule, Take 20 mg by mouth daily., Disp: , Rfl:    Potassium Chloride ER 20 MEQ TBCR, Take 2 tablets by mouth daily., Disp: , Rfl:    rivaroxaban (XARELTO) 20 MG TABS tablet, Take 1 tablet (20 mg total)  by mouth daily with supper., Disp: 30 tablet, Rfl: 3   albuterol (VENTOLIN HFA) 108 (90 Base) MCG/ACT inhaler, Inhale 2 puffs into the lungs every 6 (six) hours as needed for wheezing or shortness of breath. (Patient not taking: Reported on 05/21/2023), Disp: 6.7 g, Rfl: 0 No current facility-administered medications for this visit.  Facility-Administered Medications Ordered in Other Visits:    heparin lock flush 100 unit/mL, 500 Units, Intracatheter, Once PRN, Doreatha Massed, MD   magnesium sulfate 2 GM/50ML IVPB, , , ,    PEMEtrexed Disodium (ALIMTA) 800 mg in sodium chloride 0.9 % 100 mL chemo infusion, 400 mg/m2 (Order-Specific), Intravenous, Once, Doreatha Massed, MD   sodium chloride  flush (NS) 0.9 % injection 10 mL, 10 mL, Intracatheter, PRN, Doreatha Massed, MD, 10 mL at 04/30/23 1247   sodium chloride flush (NS) 0.9 % injection 10 mL, 10 mL, Intracatheter, PRN, Doreatha Massed, MD   Allergies: No Known Allergies  REVIEW OF SYSTEMS:   Review of Systems  Constitutional:  Negative for chills, fatigue and fever.  HENT:   Negative for lump/mass, mouth sores, nosebleeds, sore throat and trouble swallowing.   Eyes:  Negative for eye problems.  Respiratory:  Positive for shortness of breath. Negative for cough.   Cardiovascular:  Negative for chest pain, leg swelling and palpitations.  Gastrointestinal:  Negative for abdominal pain, constipation, diarrhea, nausea and vomiting.  Genitourinary:  Negative for bladder incontinence, difficulty urinating, dysuria, frequency, hematuria and nocturia.   Musculoskeletal:  Negative for arthralgias, back pain, flank pain, myalgias and neck pain.  Skin:  Negative for itching and rash.  Neurological:  Positive for dizziness and numbness. Negative for headaches.  Hematological:  Does not bruise/bleed easily.  Psychiatric/Behavioral:  Negative for depression, sleep disturbance and suicidal ideas. The patient is not nervous/anxious.    All other systems reviewed and are negative.    VITALS:   There were no vitals taken for this visit.  Wt Readings from Last 3 Encounters:  05/21/23 203 lb 0.7 oz (92.1 kg)  04/30/23 216 lb (98 kg)  04/18/23 216 lb 14.9 oz (98.4 kg)    There is no height or weight on file to calculate BMI.  Performance status (ECOG): 1 - Symptomatic but completely ambulatory  PHYSICAL EXAM:   Physical Exam Vitals and nursing note reviewed. Exam conducted with a chaperone present.  Constitutional:      Appearance: Normal appearance.  Cardiovascular:     Rate and Rhythm: Normal rate and regular rhythm.     Pulses: Normal pulses.     Heart sounds: Normal heart sounds.  Pulmonary:     Effort: Pulmonary effort is normal.     Breath sounds: Normal breath sounds.  Abdominal:     Palpations: Abdomen is soft. There is no hepatomegaly, splenomegaly or mass.     Tenderness: There is no abdominal tenderness.  Musculoskeletal:     Right lower leg: No edema.     Left lower leg: No edema.  Lymphadenopathy:     Cervical: No cervical adenopathy.     Right cervical: No superficial, deep or posterior cervical adenopathy.    Left cervical: No superficial, deep or posterior cervical adenopathy.     Upper Body:     Right upper body: No supraclavicular or axillary adenopathy.     Left upper body: No supraclavicular or axillary adenopathy.  Neurological:     General: No focal deficit present.     Mental Status: She is alert and oriented to person, place, and time.  Psychiatric:        Mood and Affect: Mood normal.        Behavior: Behavior normal.     LABS:   CBC     Component Value Date/Time   WBC 6.5 05/21/2023 1244   RBC 3.07 (L) 05/21/2023 1244   HGB 8.9 (L) 05/21/2023 1244   HCT 30.0 (L) 05/21/2023 1244   PLT 489 (H) 05/21/2023 1244   MCV 97.7 05/21/2023 1244   MCH 29.0 05/21/2023 1244   MCHC 29.7 (L) 05/21/2023 1244   RDW 16.2 (H) 05/21/2023 1244   LYMPHSABS 1.1 05/21/2023 1244    MONOABS 0.6 05/21/2023 1244   EOSABS 0.1  05/21/2023 1244   BASOSABS 0.0 05/21/2023 1244    CMP      Component Value Date/Time   NA 141 05/21/2023 1244   K 3.6 05/21/2023 1244   CL 103 05/21/2023 1244   CO2 28 05/21/2023 1244   GLUCOSE 111 (H) 05/21/2023 1244   BUN 9 05/21/2023 1244   CREATININE 0.79 05/21/2023 1244   CALCIUM 8.7 (L) 05/21/2023 1244   PROT 6.1 (L) 05/21/2023 1244   ALBUMIN 2.4 (L) 05/21/2023 1244   AST 18 05/21/2023 1244   ALT 12 05/21/2023 1244   ALKPHOS 75 05/21/2023 1244   BILITOT 0.5 05/21/2023 1244   GFRNONAA >60 05/21/2023 1244   GFRAA >60 12/11/2019 0817     No results found for: "CEA1", "CEA" / No results found for: "CEA1", "CEA" No results found for: "PSA1" No results found for: "ZOX096" No results found for: "CAN125"  No results found for: "TOTALPROTELP", "ALBUMINELP", "A1GS", "A2GS", "BETS", "BETA2SER", "GAMS", "MSPIKE", "SPEI" Lab Results  Component Value Date   TIBC 241 (L) 12/20/2022   TIBC 215 (L) 09/18/2022   TIBC 227 (L) 06/05/2022   FERRITIN 715 (H) 12/20/2022   FERRITIN 596 (H) 09/18/2022   FERRITIN 310 (H) 06/05/2022   IRONPCTSAT 12 12/20/2022   IRONPCTSAT 15 09/18/2022   IRONPCTSAT 17 06/05/2022   Lab Results  Component Value Date   LDH 178 12/29/2022   LDH 185 12/25/2022   LDH 237 (H) 07/17/2022     STUDIES:   No results found.

## 2023-05-22 ENCOUNTER — Ambulatory Visit (HOSPITAL_COMMUNITY): Payer: Medicare Other

## 2023-05-24 ENCOUNTER — Ambulatory Visit (HOSPITAL_COMMUNITY): Payer: Medicare Other | Admitting: Physical Therapy

## 2023-05-28 ENCOUNTER — Inpatient Hospital Stay: Payer: Medicare Other | Admitting: Hematology

## 2023-05-28 ENCOUNTER — Inpatient Hospital Stay: Payer: Medicare Other

## 2023-05-29 ENCOUNTER — Inpatient Hospital Stay: Payer: Medicare Other | Admitting: Hematology

## 2023-05-29 ENCOUNTER — Inpatient Hospital Stay: Payer: Medicare Other

## 2023-05-29 ENCOUNTER — Encounter (HOSPITAL_BASED_OUTPATIENT_CLINIC_OR_DEPARTMENT_OTHER): Attending: General Surgery | Admitting: General Surgery

## 2023-05-29 DIAGNOSIS — I890 Lymphedema, not elsewhere classified: Secondary | ICD-10-CM | POA: Diagnosis not present

## 2023-05-29 DIAGNOSIS — J9611 Chronic respiratory failure with hypoxia: Secondary | ICD-10-CM | POA: Insufficient documentation

## 2023-06-03 DIAGNOSIS — T148XXA Other injury of unspecified body region, initial encounter: Secondary | ICD-10-CM | POA: Diagnosis not present

## 2023-06-04 ENCOUNTER — Ambulatory Visit (HOSPITAL_COMMUNITY)
Admission: RE | Admit: 2023-06-04 | Discharge: 2023-06-04 | Disposition: A | Discharge status: Home / Self Care | Source: Ambulatory Visit | Attending: Hematology | Admitting: Hematology

## 2023-06-04 DIAGNOSIS — C3492 Malignant neoplasm of unspecified part of left bronchus or lung: Secondary | ICD-10-CM | POA: Insufficient documentation

## 2023-06-04 DIAGNOSIS — J90 Pleural effusion, not elsewhere classified: Secondary | ICD-10-CM | POA: Diagnosis not present

## 2023-06-04 DIAGNOSIS — I3139 Other pericardial effusion (noninflammatory): Secondary | ICD-10-CM | POA: Diagnosis not present

## 2023-06-04 DIAGNOSIS — K5730 Diverticulosis of large intestine without perforation or abscess without bleeding: Secondary | ICD-10-CM | POA: Diagnosis not present

## 2023-06-04 MED ORDER — IOHEXOL 300 MG/ML  SOLN
100.0000 mL | Freq: Once | INTRAMUSCULAR | Status: AC | PRN
  Administered 2023-06-04: 100 mL via INTRAVENOUS

## 2023-06-04 MED ORDER — HEPARIN SOD (PORK) LOCK FLUSH 100 UNIT/ML IV SOLN
500.0000 [IU] | Freq: Once | INTRAVENOUS | Status: AC
  Administered 2023-06-04: 500 [IU] via INTRAVENOUS

## 2023-06-07 ENCOUNTER — Other Ambulatory Visit: Payer: Self-pay | Admitting: *Deleted

## 2023-06-07 ENCOUNTER — Encounter (HOSPITAL_BASED_OUTPATIENT_CLINIC_OR_DEPARTMENT_OTHER): Payer: Medicare Other | Admitting: General Surgery

## 2023-06-07 ENCOUNTER — Telehealth: Payer: Self-pay | Admitting: *Deleted

## 2023-06-07 DIAGNOSIS — I2699 Other pulmonary embolism without acute cor pulmonale: Secondary | ICD-10-CM | POA: Diagnosis not present

## 2023-06-07 DIAGNOSIS — I10 Essential (primary) hypertension: Secondary | ICD-10-CM | POA: Diagnosis not present

## 2023-06-07 DIAGNOSIS — E119 Type 2 diabetes mellitus without complications: Secondary | ICD-10-CM | POA: Diagnosis not present

## 2023-06-07 DIAGNOSIS — T148XXA Other injury of unspecified body region, initial encounter: Secondary | ICD-10-CM | POA: Diagnosis not present

## 2023-06-07 MED ORDER — METHYLPREDNISOLONE 4 MG PO TBPK: ORAL_TABLET | ORAL | 0 refills | Status: DC

## 2023-06-07 MED ORDER — ALBUTEROL SULFATE (2.5 MG/3ML) 0.083% IN NEBU: 2.5000 mg | INHALATION_SOLUTION | Freq: Four times a day (QID) | RESPIRATORY_TRACT | 12 refills | Status: DC | PRN

## 2023-06-07 NOTE — Telephone Encounter (Signed)
 Patient called to advise that she has experienced progressive SOB.  Dr. Ellin Saba reviewed recent scan, witch did not show any changes since January.  Recommended Medrol Dose Pack and Albuterol nebulizer.  Scripts sent to [facility] and patient aware.  She has appointment with pulmonology next week.

## 2023-06-10 NOTE — Progress Notes (Deleted)
 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 Juanetta Gosling 1 time. She had a tumor on her lung and was coughing up blood and had a needle biopsy done and it was cancer and is currently doing treatment and did raditaion and chemo that ended in December.   Dyspnea:  Very slow pace x couple aisles then stop at [employer] Cough: none  Sleep: sleep in recliner 30 -45 degrees prior to treatment due to back problems /  not resp ccs SABA use: not helping  Rec We will be referring you to the cardiology office in [place]  No other options for now  Make sure you check your oxygen saturations at highest level of activity to be sure it stays over 90% and keep track of it at least once a week, more often if breathing getting worse, and let me know if losing ground.   Pulmonary follow up is as needed    06/13/2023  Re-establish  [facility] office visit/Arturo Sofranko re: *** maint on ***  No chief complaint on file.   Dyspnea:  *** Cough: *** Sleeping: ***   resp cc  SABA use: *** 02: ***  Lung cancer screening: ***   No obvious day to day or daytime variability or assoc excess/ purulent sputum or mucus plugs or hemoptysis or cp or chest tightness, subjective wheeze or overt sinus or hb symptoms.    Also denies any obvious fluctuation of symptoms with weather or environmental changes or other aggravating or alleviating factors except as outlined above    No unusual exposure hx or h/o childhood pna/ asthma or knowledge of premature birth.  Current Allergies, Complete Past Medical History, Past Surgical History, Family History, and Social History were reviewed in [employer] record.  ROS  The following are not active complaints unless bolded Hoarseness, sore throat, dysphagia, dental problems, itching, sneezing,  nasal congestion or discharge of excess mucus or purulent secretions, ear ache,   fever, chills, sweats, unintended wt loss or wt gain, classically pleuritic or exertional cp,  orthopnea pnd or arm/hand swelling  or leg swelling, presyncope, palpitations, abdominal pain, anorexia, nausea, vomiting, diarrhea  or change in bowel habits or change in bladder habits, change in stools or change in urine, dysuria, hematuria,  rash, arthralgias, visual complaints, headache, numbness, weakness or ataxia or problems with walking or coordination,  change in mood or  memory.        No outpatient medications have been marked as taking for the 06/13/23 encounter (Appointment) with Nyoka Cowden, MD.           Objective:     Wt Readings from Last 3 Encounters:  05/21/23 203 lb 0.7 oz (92.1 kg)  04/30/23 216 lb (98 kg)  04/18/23 216 lb 14.9 oz (98.4 kg)      Vital signs reviewed  06/13/2023  - Note at  rest 02 sats  ***% on ***   General appearance:    ***        06/13/2023          ***   10/09/19 (!) 289 lb (131.1 kg)  10/02/19 289 lb 0.4 oz (131.1 kg)  09/04/19 292 lb 3.2 oz (132.5 kg)       trace bilateral lower ext  edema***       I personally reviewed images and agree with radiology impression as follows:   Chest CT with contrast 09/29/19 1. Stable appearance of treated lung lesion within the posterior left upper lobe. 2. Interval development of new bandlike area of increased soft tissue density within the anterior to the treated lung lesion. This is favored to represent progressive changes of external  beam radiation. Local tumor recurrence is less favored but cannot be excluded based on today's exam. Consider short-term interval follow-up versus PET-CT. 3. Stable pericardial effusion. 4. Three vessel coronary artery atherosclerotic calcifications. 5. Aortic atherosclerosis.   Labs  reviewed:      Chemistry      Component Value Date/Time   NA 141 10/02/2019 0945   K 4.0 10/02/2019 0945   CL 108 10/02/2019 0945   CO2 25 10/02/2019 0945   BUN 12 10/02/2019 0945   CREATININE 0.71 10/02/2019 0945      Component Value Date/Time   CALCIUM 8.8 (L) 10/02/2019 0945   ALKPHOS 80 10/02/2019 0945   AST 17 10/02/2019 0945   ALT 18 10/02/2019 0945   BILITOT 0.4 10/02/2019 0945        Lab Results  Component Value Date   WBC 5.0 10/02/2019   HGB 11.8 (L) 10/02/2019   HCT 38.6 10/02/2019   MCV 87.7 10/02/2019   PLT 327 10/02/2019       Lab Results  Component Value Date   TSH 1.947 10/02/2019            Assessment

## 2023-06-11 ENCOUNTER — Encounter: Payer: Self-pay | Admitting: Hematology

## 2023-06-11 ENCOUNTER — Inpatient Hospital Stay: Payer: Medicare Other

## 2023-06-11 ENCOUNTER — Inpatient Hospital Stay (HOSPITAL_BASED_OUTPATIENT_CLINIC_OR_DEPARTMENT_OTHER): Payer: Medicare Other | Admitting: Hematology

## 2023-06-11 ENCOUNTER — Other Ambulatory Visit: Payer: Self-pay | Admitting: *Deleted

## 2023-06-11 VITALS — BP 108/58

## 2023-06-11 VITALS — HR 80 | Temp 96.2°F | Resp 20 | Wt 201.9 lb

## 2023-06-11 DIAGNOSIS — D649 Anemia, unspecified: Secondary | ICD-10-CM | POA: Diagnosis not present

## 2023-06-11 DIAGNOSIS — C3492 Malignant neoplasm of unspecified part of left bronchus or lung: Secondary | ICD-10-CM

## 2023-06-11 DIAGNOSIS — Z95828 Presence of other vascular implants and grafts: Secondary | ICD-10-CM

## 2023-06-11 DIAGNOSIS — J9621 Acute and chronic respiratory failure with hypoxia: Secondary | ICD-10-CM | POA: Diagnosis not present

## 2023-06-11 DIAGNOSIS — C7801 Secondary malignant neoplasm of right lung: Secondary | ICD-10-CM | POA: Diagnosis not present

## 2023-06-11 LAB — CBC WITH DIFFERENTIAL/PLATELET
Abs Immature Granulocytes: 0.03 10*3/uL (ref 0.00–0.07)
Basophils Absolute: 0 10*3/uL (ref 0.0–0.1)
Basophils Relative: 0 %
Eosinophils Absolute: 0 10*3/uL (ref 0.0–0.5)
Eosinophils Relative: 0 %
HCT: 29 % — ABNORMAL LOW (ref 36.0–46.0)
Hemoglobin: 8.2 g/dL — ABNORMAL LOW (ref 12.0–15.0)
Immature Granulocytes: 0 %
Lymphocytes Relative: 6 %
Lymphs Abs: 0.6 10*3/uL — ABNORMAL LOW (ref 0.7–4.0)
MCH: 28.6 pg (ref 26.0–34.0)
MCHC: 28.3 g/dL — ABNORMAL LOW (ref 30.0–36.0)
MCV: 101 fL — ABNORMAL HIGH (ref 80.0–100.0)
Monocytes Absolute: 0.7 10*3/uL (ref 0.1–1.0)
Monocytes Relative: 7 %
Neutro Abs: 8.9 10*3/uL — ABNORMAL HIGH (ref 1.7–7.7)
Neutrophils Relative %: 87 %
Platelets: 597 10*3/uL — ABNORMAL HIGH (ref 150–400)
RBC: 2.87 MIL/uL — ABNORMAL LOW (ref 3.87–5.11)
RDW: 18.1 % — ABNORMAL HIGH (ref 11.5–15.5)
WBC: 10.3 10*3/uL (ref 4.0–10.5)
nRBC: 1.2 % — ABNORMAL HIGH (ref 0.0–0.2)

## 2023-06-11 LAB — COMPREHENSIVE METABOLIC PANEL
ALT: 16 U/L (ref 0–44)
AST: 21 U/L (ref 15–41)
Albumin: 2.6 g/dL — ABNORMAL LOW (ref 3.5–5.0)
Alkaline Phosphatase: 76 U/L (ref 38–126)
Anion gap: 9 (ref 5–15)
BUN: 19 mg/dL (ref 8–23)
CO2: 29 mmol/L (ref 22–32)
Calcium: 9.2 mg/dL (ref 8.9–10.3)
Chloride: 102 mmol/L (ref 98–111)
Creatinine, Ser: 0.91 mg/dL (ref 0.44–1.00)
GFR, Estimated: >60 mL/min (ref >60)
Glucose, Bld: 117 mg/dL — ABNORMAL HIGH (ref 70–99)
Potassium: 4.1 mmol/L (ref 3.5–5.1)
Sodium: 140 mmol/L (ref 135–145)
Total Bilirubin: 0.3 mg/dL (ref 0.0–1.2)
Total Protein: 6.6 g/dL (ref 6.5–8.1)

## 2023-06-11 LAB — PREPARE RBC (CROSSMATCH)

## 2023-06-11 LAB — MAGNESIUM: Magnesium: 2.1 mg/dL (ref 1.7–2.4)

## 2023-06-11 MED ORDER — SODIUM CHLORIDE 0.9% FLUSH
10.0000 mL | INTRAVENOUS | Status: DC | PRN
Start: 2023-06-11 — End: 2023-06-11
  Administered 2023-06-11: 10 mL via INTRAVENOUS

## 2023-06-11 MED ORDER — HEPARIN SOD (PORK) LOCK FLUSH 100 UNIT/ML IV SOLN
500.0000 [IU] | Freq: Once | INTRAVENOUS | Status: AC
  Administered 2023-06-11: 500 [IU] via INTRAVENOUS

## 2023-06-11 NOTE — Progress Notes (Signed)
 Per MD and treatment team to hold treatment today. Type and screen drawn per orders. Pt scheduled to come on 06/13/23 for blood transfusion. Pt updated and all questions answered at this time. Pt left in stable condition and escorted via wheelchair by NT to be driven home by family.   Sahiti Joswick [employer]

## 2023-06-11 NOTE — Progress Notes (Signed)
 Patients port flushed without difficulty.  Good blood return noted with no bruising or swelling noted at site.  Patient remains accessed for treatment.

## 2023-06-11 NOTE — Progress Notes (Signed)
 [facility] [address] [place] [address]    Clinic Day:  06/11/2023  Referring physician: Mirna Mires, MD  Patient Care Team: Mirna Mires, MD as PCP - General (Family Medicine) Jonelle Sidle, MD as PCP - Cardiology (Cardiology) Doreatha Massed, MD as Medical Oncologist (Medical Oncology)   ASSESSMENT & PLAN:   Assessment: 1.  Adenocarcinoma of left lung (HCC) -Chemoradiation therapy with carboplatin and paclitaxel from 01/07/2019 through 02/11/2019. -Consolidation immunotherapy with durvalumab from 03/19/2019 through 04/15/2018, held due to pneumonitis. -CT chest on 07/02/2019 shows left upper lobe lung mass measuring 2.6 x 2.0 cm.  It shows improvement in size. -MRI of the brain on 07/18/2019 showed left sphenoid wing meningioma measuring 2.6 x 2.0 x 2.6 cm unchanged.  No new enhancing intracranial lesion.  Increased left temporal white matter edema. -Durvalumab restarted on 07/24/2019. -She was evaluated by cardiology with a stress test.  EF was 46%.  She underwent cardiac catheterization which did not show any abnormalities. - 1 year of durvalumab completed on 07/23/2020. - Liver mass biopsy (08/08/2021): Adenocarcinoma, CK7 positive, negative for TTF-1, Napsin a, GATA3, ER, CK20, CDX2 - PET scan (08/25/2021): Right upper lobe lung nodule 9 mm, SUV 5.0.  Groundglass and solid nodule periphery of the right upper lobe 8 mm, SUV 2.45.  Right lobe liver mass 4.5 x 3.6 cm, SUV 7.78. - NGS testing: PD-L1 negative, TMB-low, MSI-stable, K-ras G12 D.  No other targetable mutations. - Carboplatin, pemetrexed and pembrolizumab 4 cycles from 09/22/2021 through 11/25/2021, followed by maintenance pemetrexed and pembrolizumab.  Last pemetrexed and pembrolizumab on 11/30/2022.  Pembrolizumab discontinued due to colitis. -  CT CAP (11/10/2021): Reduced size of liver mass and interval resolution of previously seen groundglass pulmonary nodules.  There is a new inflammatory right  upper lobe nodule.    Plan: 1.  Metastatic adenocarcinoma of the lung to the liver and right lung: - She has tolerated last treatment very well. - CT CAP (06/04/2023): Persistent bilateral loculated pleural effusions, right greater than left.  Persistent groundglass opacities in the lungs.  No developing nodal enlargement.  Stable pericardial effusion.  Stable right hepatic lobe liver lesions. - She reports worsening dyspnea on exertion.  Normally uses 2 L/min oxygen via nasal cannula and has to bump it up to 4 L/min on exertion. - We have started her on Medrol Dosepak on 06/07/2023.  She did not notice any improvement in her breathing. - She has a follow-up appointment with Dr. Sherene Sires on Wednesday. - Labs today: Hemoglobin decreased to 8.2.  LFTs are normal.  Creatinine is normal.  She has felt better after her last blood transfusion which improved her breathing.  Will give her 1 unit PRBC.  Will hold her treatment today.  RTC 3 weeks for follow-up.    2.  Meningioma: - MRI brain (10/02/2022): Stable left sphenoid wing meningioma.  Will consider reimaging if she has symptoms.  3.  Unprovoked pulmonary embolism: - Continue Eliquis 5 mg twice daily indefinitely.   4.  Leg swellings: - Continue Lasix 20 mg daily.   5.  Normocytic anemia: - Colonoscopy in 2024 showed nonbleeding internal hemorrhoids.  Hemoglobin today is 8.2, from myelosuppression.   6.  Hypomagnesemia: - Continue mag twice daily.  Magnesium is normal.    Orders Placed This Encounter  Procedures   Care order/instruction    Transfuse Parameters    Standing Status:   Future    Number of Occurrences:   1  Expiration Date:   06/10/2024   Type and screen         Standing Status:   Future    Number of Occurrences:   1    Expiration Date:   06/10/2024   Prepare RBC (crossmatch)    Standing Status:   Standing    Number of Occurrences:   1    # of Units:   1 unit    Transfusion Indications:   Other    Comments:    symptomatic anemia    Number of Units to Keep Ahead:   NO units ahead    Instructions::   Transfuse    If emergent release call blood bank:   Not emergent release      I,Helena R Teague,acting as a scribe for Doreatha Massed, MD.,have documented all relevant documentation on the behalf of Doreatha Massed, MD,as directed by  Doreatha Massed, MD while in the presence of Doreatha Massed, MD.  I, Doreatha Massed MD, have reviewed the above documentation for accuracy and completeness, and I agree with the above.     Doreatha Massed, MD   3/24/20251:52 PM  CHIEF COMPLAINT:   Diagnosis: stage IV adenocarcinoma of the left lung    Cancer Staging  Adenocarcinoma of left lung [facility]) Staging form: Lung, AJCC 8th Edition - Clinical stage from 01/02/2019: Stage IIIB (cT3, cN2, cM0) - Signed by Doreatha Massed, MD on 01/02/2019 - Pathologic stage from 09/07/2021: Stage IVA (pTX, pNX, pM1b) - Unsigned    Prior Therapy: 1. Chemoradiation with carboplatin and paclitaxel from 01/07/2019 to 02/11/2019. 2. Consolidation with durvalumab from 03/19/2019 to 04/16/2019, held due to pneumonitis.  Current Therapy:  Pemetrexed and pembrolizumab maintenance    HISTORY OF PRESENT ILLNESS:   Oncology History  Adenocarcinoma of left lung (HCC)  12/09/2018 Initial Diagnosis   Adenocarcinoma of left lung (HCC)   01/02/2019 Cancer Staging   Staging form: Lung, AJCC 8th Edition - Clinical stage from 01/02/2019: Stage IIIB (cT3, cN2, cM0) - Signed by Doreatha Massed, MD on 01/02/2019   01/07/2019 - 02/11/2019 Chemotherapy   The patient had palonosetron (ALOXI) injection 0.25 mg, 0.25 mg, Intravenous,  Once, 6 of 6 cycles Administration: 0.25 mg (01/07/2019), 0.25 mg (01/14/2019), 0.25 mg (01/21/2019), 0.25 mg (01/28/2019), 0.25 mg (02/04/2019), 0.25 mg (02/11/2019) CARBOplatin (PARAPLATIN) 270 mg in sodium chloride 0.9 % 250 mL chemo infusion, 270 mg (100 % of original dose  266.4 mg), Intravenous,  Once, 6 of 6 cycles Dose modification:   (original dose 266.4 mg, Cycle 1),   (original dose 266.4 mg, Cycle 2),   (original dose 266.4 mg, Cycle 3),   (original dose 266.4 mg, Cycle 4) Administration: 270 mg (01/07/2019), 270 mg (01/14/2019), 270 mg (01/21/2019), 270 mg (01/28/2019), 270 mg (02/04/2019), 270 mg (02/11/2019) PACLitaxel (TAXOL) 108 mg in sodium chloride 0.9 % 250 mL chemo infusion (</= 80mg /m2), 45 mg/m2 = 108 mg, Intravenous,  Once, 6 of 6 cycles Administration: 108 mg (01/07/2019), 108 mg (01/14/2019), 108 mg (01/21/2019), 108 mg (01/28/2019), 108 mg (02/04/2019), 108 mg (02/11/2019) fosaprepitant (EMEND) 150 mg, dexamethasone (DECADRON) 12 mg in sodium chloride 0.9 % 145 mL IVPB, , Intravenous,  Once, 5 of 5 cycles Administration:  (01/14/2019),  (01/21/2019),  (01/28/2019),  (02/04/2019),  (02/11/2019)  for chemotherapy treatment.    03/19/2019 - 07/23/2020 Chemotherapy   Patient is on Treatment Plan : LUNG DURVALUMAB Q14D     09/22/2021 - 11/04/2021 Chemotherapy   Patient is on Treatment Plan : LUNG Carboplatin (5) + Pemetrexed (  500) + Pembrolizumab (200) D1 q21d Induction x 4 cycles / Maintenance Pemetrexed (500) + Pembrolizumab (200) D1 q21d     09/22/2021 -  Chemotherapy   Patient is on Treatment Plan : LUNG Maintenance Alimta q 21 days        INTERVAL HISTORY:   Denise [name] is a 73 y.o. female presenting to clinic today for follow up of stage IV adenocarcinoma of the left lung. She was last seen by me on 05/21/23.  Since her last visit, she underwent CT C/A/P on 06/04/23.   Today, she states that she is doing well overall. Her appetite level is at 20%. Her energy level is at 20%.  PAST MEDICAL HISTORY:   Past Medical History: Past Medical History:  Diagnosis Date   Anemia    Aortic stenosis    Arthritis    Coronary artery calcification seen on CT scan    Dyspnea    Essential hypertension    GERD (gastroesophageal reflux disease)    History of  lung cancer    Stage III adenocarcinoma status post chemoradiation   Port-A-Cath in place 01/06/2019   Type 2 diabetes mellitus [facility])     Surgical History: Past Surgical History:  Procedure Laterality Date   BIOPSY  09/25/2022   Procedure: BIOPSY;  Surgeon: Lanelle Bal, DO;  [facility]: [facility];  Service: Endoscopy;;   CHOLECYSTECTOMY  1997   COLONOSCOPY     COLONOSCOPY WITH PROPOFOL N/A 09/25/2022   Procedure: COLONOSCOPY WITH PROPOFOL;  Surgeon: Lanelle Bal, DO;  Location: [facility];  Service: Endoscopy;  Laterality: N/A;  11:00;ASA 3   ESOPHAGEAL BRUSHING  09/25/2022   Procedure: ESOPHAGEAL BRUSHING;  Surgeon: Lanelle Bal, DO;  Location: [facility];  Service: Endoscopy;;   ESOPHAGOGASTRODUODENOSCOPY (EGD) WITH PROPOFOL N/A 09/25/2022   Procedure: ESOPHAGOGASTRODUODENOSCOPY (EGD) WITH PROPOFOL;  Surgeon: Lanelle Bal, DO;  Location: [facility];  Service: Endoscopy;  Laterality: N/A;  11:00AM;ASA 3   GASTRIC BYPASS     INCISIONAL HERNIA REPAIR  04/11/11   IR IMAGING GUIDED PORT INSERTION  12/27/2018   Right   IR US GUIDE BX ASP/DRAIN  08/08/2021   LAPAROSCOPIC SALPINGOOPHERECTOMY     LAPAROTOMY  04/11/2011   Procedure: EXPLORATORY LAPAROTOMY;  Surgeon: Clovis Pu. Cornett, MD;  Location: [facility];  Service: General;  Laterality: N/A;  closure port hole   POLYPECTOMY  09/25/2022   Procedure: POLYPECTOMY;  Surgeon: Lanelle Bal, DO;  Location: [facility];  Service: Endoscopy;;   RIGHT/LEFT HEART CATH AND CORONARY ANGIOGRAPHY N/A 12/29/2019   Procedure: RIGHT/LEFT HEART CATH AND CORONARY ANGIOGRAPHY;  Surgeon: Lennette Bihari, MD;  Location: [facility];  Service: Cardiovascular;  Laterality: N/A;   TOTAL HIP ARTHROPLASTY  03/07/2012   Procedure: TOTAL HIP ARTHROPLASTY ANTERIOR APPROACH;  Surgeon: Shelda Pal, MD;  Location: [facility];  Service: Orthopedics;  Laterality: Right;   TOTAL SHOULDER ARTHROPLASTY Left 01/21/2015   TOTAL SHOULDER  ARTHROPLASTY Left 01/21/2015   Procedure: LEFT TOTAL SHOULDER ARTHROPLASTY;  Surgeon: Francena Hanly, MD;  Location: [facility];  Service: Orthopedics;  Laterality: Left;   VAGINAL HYSTERECTOMY      Social History: Social History   Socioeconomic History   Marital status: Single    Spouse name: Not on file   Number of children: Not on file   Years of education: 12th grade   Highest education level: Not on file  Occupational History   Occupation: Employed    Employer: [employer]  Tobacco Use   Smoking status: Former    Current packs/day: 0.00    Average packs/day: 1 pack/day for 12.0 years (12.0 ttl pk-yrs)    Types: Cigarettes    Start date: 03/20/1964    Quit date: 03/20/1976    Years since quitting: 47.2    Passive exposure: Past   Smokeless tobacco: Never  Vaping Use   Vaping status: Never Used  Substance and Sexual Activity   Alcohol use: No   Drug use: No   Sexual activity: Not Currently  Other Topics Concern   Not on file  Social History Narrative   Not on file   Social Drivers of Health   Financial Resource Strain: Low Risk  (12/06/2018)   Overall Financial Resource Strain (CARDIA)    Difficulty of Paying Living Expenses: Not very hard  Food Insecurity: No Food Insecurity (01/11/2023)   Hunger Vital Sign    Worried About Running Out of Food in the Last Year: Never true    Ran Out of Food in the Last Year: Never true  Transportation Needs: No Transportation Needs (01/11/2023)   PRAPARE - Administrator, Civil Service (Medical): No    Lack of Transportation (Non-Medical): No  Physical Activity: Inactive (12/06/2018)   Exercise Vital Sign    Days of Exercise per Week: 0 days    Minutes of Exercise per Session: 0 min  Stress: No Stress Concern Present (12/09/2018)   Received from [facility] of Occupational Health - Occupational Stress Questionnaire    Feeling of Stress : Not at all  Social Connections:  Moderately Isolated (12/06/2018)   Social Connection and Isolation Panel [NHANES]    Frequency of Communication with Friends and Family: More than three times a week    Frequency of Social Gatherings with Friends and Family: Once a week    Attends Religious Services: More than 4 times per year    Active Member of [employer] or Organizations: No    Attends Banker Meetings: Never    Marital Status: Never married  Intimate Partner Violence: Not At Risk (01/11/2023)   Humiliation, Afraid, Rape, and Kick questionnaire    Fear of Current or Ex-Partner: No    Emotionally Abused: No    Physically Abused: No    Sexually Abused: No    Family History: Family History  Problem Relation Age of Onset   Breast cancer Mother    COPD Mother    Arthritis Mother    Diabetes Mother    Hypertension Mother    Hypertension Father    Diabetes Father    Breast cancer Sister    Thyroid cancer Brother    Heart attack Brother    Huntington's disease Maternal Grandmother     Current Medications:  Current Outpatient Medications:    acetaminophen (TYLENOL) 500 MG tablet, Take 500 mg by mouth every 6 (six) hours as needed for moderate pain., Disp: , Rfl:    albuterol (PROVENTIL) (2.5 MG/3ML) 0.083% nebulizer solution, Take 3 mLs (2.5 mg total) by nebulization every 6 (six) hours as needed for wheezing or shortness of breath., Disp: 75 mL, Rfl: 12   albuterol (VENTOLIN HFA) 108 (90 Base) MCG/ACT inhaler, Inhale 2 puffs into the lungs every 6 (six) hours as needed for wheezing or shortness of breath., Disp: 6.7 g, Rfl: 0   Calcium Carbonate Antacid (TUMS PO), Take 4 tablets by mouth daily., Disp: , Rfl:  carvedilol (COREG) 3.125 MG tablet, Take 1 tablet (3.125 mg total) by mouth 2 (two) times daily., Disp: 180 tablet, Rfl: 3   ciprofloxacin (CILOXAN) 0.3 % ophthalmic solution, Place 1 drop into both eyes every 4 (four) hours while awake. Administer 1 drop, every 2 hours, while awake, for 2 days. Then  1 drop, every 4 hours, while awake, for the next 5 days., Disp: 5 mL, Rfl: 0   Cyanocobalamin (VITAMIN B-12) 2500 MCG SUBL, Place 2,500 mcg under the tongue every morning. , Disp: , Rfl:    ferrous sulfate 325 (65 FE) MG tablet, Take 325 mg by mouth every evening., Disp: , Rfl:    folic acid (FOLVITE) 1 MG tablet, TAKE 1 TABLET BY MOUTH EVERY DAY, Disp: 90 tablet, Rfl: 1   gabapentin (NEURONTIN) 600 MG tablet, Take 600 mg by mouth at bedtime., Disp: , Rfl:    Glycerin-Hypromellose-PEG 400 (DRY EYE RELIEF DROPS OP), Apply 1 drop to eye daily as needed (dry eyes)., Disp: , Rfl:    loteprednol (LOTEMAX) 0.5 % ophthalmic suspension, 1 drop 2 (two) times daily., Disp: , Rfl:    magnesium oxide (MAG-OX) 400 (240 Mg) MG tablet, Take 1 tablet (400 mg total) by mouth 2 (two) times daily., Disp: 60 tablet, Rfl: 4   methylPREDNISolone (MEDROL DOSEPAK) 4 MG TBPK tablet, Take 6 tablets (24 mg total) by mouth Nightly for 1 day, THEN 5 tablets (20 mg total) Nightly for 1 day, THEN 4 tablets (16 mg total) Nightly for 1 day, THEN 3 tablets (12 mg total) Nightly for 1 day, THEN 2 tablets (8 mg total) Nightly for 1 day, THEN 1 tablet (4 mg total) Nightly for 1 day., Disp: 21 tablet, Rfl: 0   midodrine (PROAMATINE) 5 MG tablet, Take 1 tablet (5 mg total) by mouth 3 (three) times daily with meals., Disp: 90 tablet, Rfl: 5   Multiple Vitamin (MULITIVITAMIN WITH MINERALS) TABS, Take 1 tablet by mouth daily., Disp: , Rfl:    omeprazole (PRILOSEC) 20 MG capsule, Take 20 mg by mouth daily., Disp: , Rfl:    Potassium Chloride ER 20 MEQ TBCR, Take 1 tablet by mouth daily., Disp: , Rfl:    rivaroxaban (XARELTO) 20 MG TABS tablet, Take 1 tablet (20 mg total) by mouth daily with supper., Disp: 30 tablet, Rfl: 3 No current facility-administered medications for this visit.  Facility-Administered Medications Ordered in Other Visits:    heparin lock flush 100 unit/mL, 500 Units, Intravenous, Once, Doreatha Massed, MD    magnesium sulfate 2 GM/50ML IVPB, , , ,    Allergies: No Known Allergies  REVIEW OF SYSTEMS:   Review of Systems  Constitutional:  Negative for chills, fatigue and fever.  HENT:   Negative for lump/mass, mouth sores, nosebleeds, sore throat and trouble swallowing.   Eyes:  Negative for eye problems.  Respiratory:  Negative for cough and shortness of breath.   Cardiovascular:  Negative for chest pain, leg swelling and palpitations.  Gastrointestinal:  Positive for constipation. Negative for abdominal pain, diarrhea, nausea and vomiting.  Genitourinary:  Negative for bladder incontinence, difficulty urinating, dysuria, frequency, hematuria and nocturia.   Musculoskeletal:  Negative for arthralgias, back pain, flank pain, myalgias and neck pain.  Skin:  Negative for itching and rash.  Neurological:  Positive for numbness. Negative for dizziness and headaches.  Hematological:  Does not bruise/bleed easily.  Psychiatric/Behavioral:  Negative for depression, sleep disturbance and suicidal ideas. The patient is not nervous/anxious.   All other systems reviewed and  are negative.    VITALS:   Blood pressure (!) 108/58.  Wt Readings from Last 3 Encounters:  06/11/23 201 lb 15.1 oz (91.6 kg)  05/21/23 203 lb 0.7 oz (92.1 kg)  04/30/23 216 lb (98 kg)    There is no height or weight on file to calculate BMI.  Performance status (ECOG): 1 - Symptomatic but completely ambulatory  PHYSICAL EXAM:   Physical Exam Vitals and nursing note reviewed. Exam conducted with a chaperone present.  Constitutional:      Appearance: Normal appearance.  Cardiovascular:     Rate and Rhythm: Normal rate and regular rhythm.     Pulses: Normal pulses.     Heart sounds: Normal heart sounds.  Pulmonary:     Effort: Pulmonary effort is normal.     Breath sounds: Normal breath sounds.  Abdominal:     Palpations: Abdomen is soft. There is no hepatomegaly, splenomegaly or mass.     Tenderness: There is no  abdominal tenderness.  Musculoskeletal:     Right lower leg: Edema present.     Left lower leg: Edema present.  Lymphadenopathy:     Cervical: No cervical adenopathy.     Right cervical: No superficial, deep or posterior cervical adenopathy.    Left cervical: No superficial, deep or posterior cervical adenopathy.     Upper Body:     Right upper body: No supraclavicular or axillary adenopathy.     Left upper body: No supraclavicular or axillary adenopathy.  Neurological:     General: No focal deficit present.     Mental Status: She is alert and oriented to person, place, and time.  Psychiatric:        Mood and Affect: Mood normal.        Behavior: Behavior normal.     LABS:   CBC     Component Value Date/Time   WBC 10.3 06/11/2023 1300   RBC 2.87 (L) 06/11/2023 1300   HGB 8.2 (L) 06/11/2023 1300   HCT 29.0 (L) 06/11/2023 1300   PLT 597 (H) 06/11/2023 1300   MCV 101.0 (H) 06/11/2023 1300   MCH 28.6 06/11/2023 1300   MCHC 28.3 (L) 06/11/2023 1300   RDW 18.1 (H) 06/11/2023 1300   LYMPHSABS 0.6 (L) 06/11/2023 1300   MONOABS 0.7 06/11/2023 1300   EOSABS 0.0 06/11/2023 1300   BASOSABS 0.0 06/11/2023 1300    CMP      Component Value Date/Time   NA 140 06/11/2023 1300   K 4.1 06/11/2023 1300   CL 102 06/11/2023 1300   CO2 29 06/11/2023 1300   GLUCOSE 117 (H) 06/11/2023 1300   BUN 19 06/11/2023 1300   CREATININE 0.91 06/11/2023 1300   CALCIUM 9.2 06/11/2023 1300   PROT 6.6 06/11/2023 1300   ALBUMIN 2.6 (L) 06/11/2023 1300   AST 21 06/11/2023 1300   ALT 16 06/11/2023 1300   ALKPHOS 76 06/11/2023 1300   BILITOT 0.3 06/11/2023 1300   GFRNONAA >60 06/11/2023 1300   GFRAA >60 12/11/2019 0817     No results found for: "CEA1", "CEA" / No results found for: "CEA1", "CEA" No results found for: "PSA1" No results found for: "FGH829" No results found for: "CAN125"  No results found for: "TOTALPROTELP", "ALBUMINELP", "A1GS", "A2GS", "BETS", "BETA2SER", "GAMS", "MSPIKE",  "SPEI" Lab Results  Component Value Date   TIBC 241 (L) 12/20/2022   TIBC 215 (L) 09/18/2022   TIBC 227 (L) 06/05/2022   FERRITIN 715 (H) 12/20/2022   FERRITIN 596 (H)  09/18/2022   FERRITIN 310 (H) 06/05/2022   IRONPCTSAT 12 12/20/2022   IRONPCTSAT 15 09/18/2022   IRONPCTSAT 17 06/05/2022   Lab Results  Component Value Date   LDH 178 12/29/2022   LDH 185 12/25/2022   LDH 237 (H) 07/17/2022     STUDIES:   CT CHEST ABDOMEN PELVIS W CONTRAST Result Date: 06/07/2023 CLINICAL DATA:  Monitoring adenocarcinoma of left lung. * Tracking Code: BO * EXAM: CT CHEST, ABDOMEN, AND PELVIS WITH CONTRAST TECHNIQUE: Multidetector CT imaging of the chest, abdomen and pelvis was performed following the standard protocol during bolus administration of intravenous contrast. RADIATION DOSE REDUCTION: This exam was performed according to the departmental dose-optimization program which includes automated exposure control, adjustment of the mA and/or kV according to patient size and/or use of iterative reconstruction technique. CONTRAST:  OMNIPAQUE IOHEXOL 300 MG/ML  SOLN COMPARISON:  CT angiogram 04/19/2023. Standard CT chest 02/20/2023. Abdomen pelvis 01/11/2023. FINDINGS: CT CHEST FINDINGS Cardiovascular: Right IJ chest port. Port is accessed. Tip seen along the SVC right atrial junction. Heart is slightly large. Mild-to-moderate pericardial effusion is again seen. Prominent calcifications along the aortic valve and along the coronary arteries. Mild calcified plaque along the course of the thoracic aorta. Mediastinum/Nodes: Slightly patulous thoracic esophagus. Preserved thyroid gland. No specific abnormal lymph node enlargement identified in the axillary regions, hilum or mediastinum. Portions of the left upper chest are obscured by the extreme streak artifact from the left hip arthroplasty. Lungs/Pleura: Bilateral pleural effusions are seen, small left and moderate right. The right side has some loculated  components as seen previous. Additional lungs demonstrates some scattered ground-glass in a perihilar distribution on the right as well as some bronchial wall thickening and more confluent opacity at the right lower lobe and middle lobe. Consolidative area with air bronchograms in the posterior left upper lobe are also again seen. Some perihilar areas left lower lobe. The amount opacity seen at the left upper lung is appears slightly decreased from previous. No new dominant lung mass. Musculoskeletal: Streak artifact related to the patient's left shoulder arthroplasty. Advanced degenerative changes of the right shoulder. Scattered degenerative changes along the spine with some curvature. CT ABDOMEN PELVIS FINDINGS Hepatobiliary: Previous cholecystectomy. Patent portal vein. Benign-appearing scattered hepatic cystic foci identified such as left hepatic lobe series 2, image 54, unchanged. Heterogeneous lesions in the right hepatic lobe are again noted. The lesion which previously measured 3.5 x 4.1 cm, today on series 2, image 56 measures 3.9 x 3.7 cm with central area of necrosis. Lesions caudal this appears similar as well but the margins are less well defined. Pancreas: Unremarkable. No pancreatic ductal dilatation or surrounding inflammatory changes. Spleen: Normal in size without focal abnormality. Adrenals/Urinary Tract: Stable thickening of the left adrenal gland. Right adrenal gland is preserved. No enhancing renal mass or collecting system dilatation. Area of focal atrophy along the posterior aspect of the lower pole of the right kidney is stable. The ureters have normal course and caliber extending down to the urinary bladder. Bladder is underdistended. Stomach/Bowel: Surgical changes of previous gastric bypass. Limit extends superior and anterior to the stomach. Small bowel is nondilated. Large bowel is normal course and caliber with scattered stool and diverticula. Normal appendix in the right lower  quadrant. Previous changes of colitis no longer identified. Vascular/Lymphatic: Aortic atherosclerosis. No enlarged abdominal or pelvic lymph nodes. Reproductive: Dystrophic calcification along the uterus may be a calcified fibroid, unchanged from previous. No separate adnexal mass Other: Anasarca. Mesenteric stranding with minimal  layering areas of fluid, nonspecific. No free air. Rectus muscle diastasis with protuberance and herniation along the anterior abdominal wall. Musculoskeletal: Advanced degenerative changes along the spine. Multilevel stenosis. Right hip arthroplasty. IMPRESSION: Overall no significant interval change. Persistent bilateral pleural effusions, right-greater-than-left with the adjacent parenchymal opacities including some confluent areas consolidation and some ground-glass. No developing nodal enlargement. Stable pericardial effusion. Stable right hepatic lobe liver lesions. The more caudal smaller focus in the right hepatic lobe is less well defined along its margins than the previous. Colonic diverticulosis.  No obstruction. Electronically Signed   By: Karen Kays M.D.   On: 06/07/2023 10:27

## 2023-06-11 NOTE — Patient Instructions (Addendum)
 [facility] at [facility] Discharge Instructions   You were seen and examined today by Dr. Ellin Saba.  He reviewed the results of your lab work which are normal/stable.   He reviewed the results of your CT scan which is stable.   We will hold your treatment today.  We will give you a unit of blood on Wednesday.   Return as scheduled.    Thank you for choosing [facility] at [facility] to provide your oncology and hematology care.  To afford each patient quality time with our provider, please arrive at least 15 minutes before your scheduled appointment time.   If you have a lab appointment with the Cancer Center please come in thru the Main Entrance and check in at the main information desk.  You need to re-schedule your appointment should you arrive 10 or more minutes late.  We strive to give you quality time with our providers, and arriving late affects you and other patients whose appointments are after yours.  Also, if you no show three or more times for appointments you may be dismissed from the clinic at the providers discretion.     Again, thank you for choosing [facility].  Our hope is that these requests will decrease the amount of time that you wait before being seen by our physicians.       _____________________________________________________________  Should you have questions after your visit to [facility], please contact our office at [phone] and follow the prompts.  Our office hours are 8:00 a.m. and 4:30 p.m. Monday - Friday.  Please note that voicemails left after 4:00 p.m. may not be returned until the following business day.  We are closed weekends and major holidays.  You do have access to a nurse 24-7, just call the main number to the clinic [phone] and do not press any options, hold on the line and a nurse will answer the phone.    For prescription refill requests, have your pharmacy  contact our office and allow 72 hours.    Due to Covid, you will need to wear a mask upon entering the hospital. If you do not have a mask, a mask will be given to you at the Main Entrance upon arrival. For doctor visits, patients may have 1 support person age 19 or older with them. For treatment visits, patients can not have anyone with them due to social distancing guidelines and our immunocompromised population.

## 2023-06-12 ENCOUNTER — Encounter: Payer: Self-pay | Admitting: Hematology

## 2023-06-13 ENCOUNTER — Inpatient Hospital Stay (HOSPITAL_COMMUNITY)
Admission: EM | Admit: 2023-06-13 | Discharge: 2023-06-16 | DRG: 180 | Disposition: A | Discharge status: Home / Self Care | Source: Ambulatory Visit | Attending: Family Medicine | Admitting: Family Medicine

## 2023-06-13 ENCOUNTER — Other Ambulatory Visit: Payer: Self-pay

## 2023-06-13 ENCOUNTER — Emergency Department (HOSPITAL_COMMUNITY)

## 2023-06-13 ENCOUNTER — Ambulatory Visit: Payer: Medicare Other | Admitting: Internal Medicine

## 2023-06-13 ENCOUNTER — Encounter (HOSPITAL_COMMUNITY): Payer: Self-pay

## 2023-06-13 ENCOUNTER — Inpatient Hospital Stay

## 2023-06-13 DIAGNOSIS — Z923 Personal history of irradiation: Secondary | ICD-10-CM

## 2023-06-13 DIAGNOSIS — J90 Pleural effusion, not elsewhere classified: Secondary | ICD-10-CM | POA: Diagnosis not present

## 2023-06-13 DIAGNOSIS — J9621 Acute and chronic respiratory failure with hypoxia: Secondary | ICD-10-CM | POA: Diagnosis not present

## 2023-06-13 DIAGNOSIS — D630 Anemia in neoplastic disease: Secondary | ICD-10-CM | POA: Diagnosis not present

## 2023-06-13 DIAGNOSIS — I110 Hypertensive heart disease with heart failure: Secondary | ICD-10-CM | POA: Diagnosis present

## 2023-06-13 DIAGNOSIS — I10 Essential (primary) hypertension: Secondary | ICD-10-CM | POA: Diagnosis not present

## 2023-06-13 DIAGNOSIS — J9811 Atelectasis: Secondary | ICD-10-CM | POA: Diagnosis present

## 2023-06-13 DIAGNOSIS — Z808 Family history of malignant neoplasm of other organs or systems: Secondary | ICD-10-CM

## 2023-06-13 DIAGNOSIS — C787 Secondary malignant neoplasm of liver and intrahepatic bile duct: Secondary | ICD-10-CM | POA: Diagnosis not present

## 2023-06-13 DIAGNOSIS — Z95828 Presence of other vascular implants and grafts: Secondary | ICD-10-CM | POA: Diagnosis not present

## 2023-06-13 DIAGNOSIS — Z8249 Family history of ischemic heart disease and other diseases of the circulatory system: Secondary | ICD-10-CM

## 2023-06-13 DIAGNOSIS — I3139 Other pericardial effusion (noninflammatory): Secondary | ICD-10-CM | POA: Diagnosis present

## 2023-06-13 DIAGNOSIS — I480 Paroxysmal atrial fibrillation: Secondary | ICD-10-CM | POA: Diagnosis present

## 2023-06-13 DIAGNOSIS — E119 Type 2 diabetes mellitus without complications: Secondary | ICD-10-CM | POA: Diagnosis present

## 2023-06-13 DIAGNOSIS — J948 Other specified pleural conditions: Secondary | ICD-10-CM | POA: Diagnosis not present

## 2023-06-13 DIAGNOSIS — H04129 Dry eye syndrome of unspecified lacrimal gland: Secondary | ICD-10-CM | POA: Diagnosis present

## 2023-06-13 DIAGNOSIS — Z48813 Encounter for surgical aftercare following surgery on the respiratory system: Secondary | ICD-10-CM | POA: Diagnosis not present

## 2023-06-13 DIAGNOSIS — Z9884 Bariatric surgery status: Secondary | ICD-10-CM

## 2023-06-13 DIAGNOSIS — I5033 Acute on chronic diastolic (congestive) heart failure: Secondary | ICD-10-CM | POA: Diagnosis not present

## 2023-06-13 DIAGNOSIS — Z23 Encounter for immunization: Secondary | ICD-10-CM | POA: Diagnosis not present

## 2023-06-13 DIAGNOSIS — R0989 Other specified symptoms and signs involving the circulatory and respiratory systems: Secondary | ICD-10-CM | POA: Diagnosis not present

## 2023-06-13 DIAGNOSIS — T451X5A Adverse effect of antineoplastic and immunosuppressive drugs, initial encounter: Secondary | ICD-10-CM | POA: Diagnosis not present

## 2023-06-13 DIAGNOSIS — R0789 Other chest pain: Secondary | ICD-10-CM | POA: Diagnosis not present

## 2023-06-13 DIAGNOSIS — J918 Pleural effusion in other conditions classified elsewhere: Secondary | ICD-10-CM | POA: Diagnosis not present

## 2023-06-13 DIAGNOSIS — D649 Anemia, unspecified: Secondary | ICD-10-CM

## 2023-06-13 DIAGNOSIS — Z833 Family history of diabetes mellitus: Secondary | ICD-10-CM

## 2023-06-13 DIAGNOSIS — R079 Chest pain, unspecified: Secondary | ICD-10-CM | POA: Diagnosis not present

## 2023-06-13 DIAGNOSIS — C7801 Secondary malignant neoplasm of right lung: Secondary | ICD-10-CM | POA: Diagnosis not present

## 2023-06-13 DIAGNOSIS — C3492 Malignant neoplasm of unspecified part of left bronchus or lung: Secondary | ICD-10-CM | POA: Diagnosis not present

## 2023-06-13 DIAGNOSIS — I2510 Atherosclerotic heart disease of native coronary artery without angina pectoris: Secondary | ICD-10-CM | POA: Diagnosis present

## 2023-06-13 DIAGNOSIS — Z7901 Long term (current) use of anticoagulants: Secondary | ICD-10-CM | POA: Diagnosis not present

## 2023-06-13 DIAGNOSIS — Z96641 Presence of right artificial hip joint: Secondary | ICD-10-CM | POA: Diagnosis present

## 2023-06-13 DIAGNOSIS — Z9981 Dependence on supplemental oxygen: Secondary | ICD-10-CM

## 2023-06-13 DIAGNOSIS — Z803 Family history of malignant neoplasm of breast: Secondary | ICD-10-CM

## 2023-06-13 DIAGNOSIS — I350 Nonrheumatic aortic (valve) stenosis: Secondary | ICD-10-CM | POA: Diagnosis present

## 2023-06-13 DIAGNOSIS — Z86711 Personal history of pulmonary embolism: Secondary | ICD-10-CM | POA: Diagnosis not present

## 2023-06-13 DIAGNOSIS — E66813 Obesity, class 3: Secondary | ICD-10-CM | POA: Diagnosis present

## 2023-06-13 DIAGNOSIS — D6481 Anemia due to antineoplastic chemotherapy: Secondary | ICD-10-CM | POA: Diagnosis present

## 2023-06-13 DIAGNOSIS — E66812 Obesity, class 2: Secondary | ICD-10-CM | POA: Diagnosis present

## 2023-06-13 DIAGNOSIS — I2699 Other pulmonary embolism without acute cor pulmonale: Secondary | ICD-10-CM | POA: Diagnosis not present

## 2023-06-13 DIAGNOSIS — Z79899 Other long term (current) drug therapy: Secondary | ICD-10-CM

## 2023-06-13 DIAGNOSIS — Z87891 Personal history of nicotine dependence: Secondary | ICD-10-CM

## 2023-06-13 DIAGNOSIS — I517 Cardiomegaly: Secondary | ICD-10-CM | POA: Diagnosis not present

## 2023-06-13 DIAGNOSIS — R918 Other nonspecific abnormal finding of lung field: Secondary | ICD-10-CM | POA: Diagnosis not present

## 2023-06-13 DIAGNOSIS — Z8261 Family history of arthritis: Secondary | ICD-10-CM

## 2023-06-13 DIAGNOSIS — Z9221 Personal history of antineoplastic chemotherapy: Secondary | ICD-10-CM

## 2023-06-13 DIAGNOSIS — Z6836 Body mass index (BMI) 36.0-36.9, adult: Secondary | ICD-10-CM

## 2023-06-13 DIAGNOSIS — I700 Atherosclerosis of aorta: Secondary | ICD-10-CM | POA: Diagnosis not present

## 2023-06-13 DIAGNOSIS — K219 Gastro-esophageal reflux disease without esophagitis: Secondary | ICD-10-CM | POA: Diagnosis present

## 2023-06-13 DIAGNOSIS — I4891 Unspecified atrial fibrillation: Secondary | ICD-10-CM | POA: Diagnosis not present

## 2023-06-13 DIAGNOSIS — Z825 Family history of asthma and other chronic lower respiratory diseases: Secondary | ICD-10-CM

## 2023-06-13 DIAGNOSIS — T148XXA Other injury of unspecified body region, initial encounter: Secondary | ICD-10-CM | POA: Diagnosis not present

## 2023-06-13 DIAGNOSIS — Z90710 Acquired absence of both cervix and uterus: Secondary | ICD-10-CM

## 2023-06-13 DIAGNOSIS — Z471 Aftercare following joint replacement surgery: Secondary | ICD-10-CM | POA: Diagnosis not present

## 2023-06-13 DIAGNOSIS — Z96612 Presence of left artificial shoulder joint: Secondary | ICD-10-CM | POA: Diagnosis present

## 2023-06-13 LAB — BRAIN NATRIURETIC PEPTIDE: B Natriuretic Peptide: 428 pg/mL — ABNORMAL HIGH (ref 0.0–100.0)

## 2023-06-13 LAB — CBC WITH DIFFERENTIAL/PLATELET
Abs Immature Granulocytes: 0.02 10*3/uL (ref 0.00–0.07)
Basophils Absolute: 0 10*3/uL (ref 0.0–0.1)
Basophils Relative: 1 %
Eosinophils Absolute: 0.1 10*3/uL (ref 0.0–0.5)
Eosinophils Relative: 1 %
HCT: 32.9 % — ABNORMAL LOW (ref 36.0–46.0)
Hemoglobin: 9.6 g/dL — ABNORMAL LOW (ref 12.0–15.0)
Immature Granulocytes: 0 %
Lymphocytes Relative: 13 %
Lymphs Abs: 1.1 10*3/uL (ref 0.7–4.0)
MCH: 28.9 pg (ref 26.0–34.0)
MCHC: 29.2 g/dL — ABNORMAL LOW (ref 30.0–36.0)
MCV: 99.1 fL (ref 80.0–100.0)
Monocytes Absolute: 0.9 10*3/uL (ref 0.1–1.0)
Monocytes Relative: 11 %
Neutro Abs: 6.4 10*3/uL (ref 1.7–7.7)
Neutrophils Relative %: 74 %
Platelets: 607 10*3/uL — ABNORMAL HIGH (ref 150–400)
RBC: 3.32 MIL/uL — ABNORMAL LOW (ref 3.87–5.11)
RDW: 18.9 % — ABNORMAL HIGH (ref 11.5–15.5)
WBC: 8.6 10*3/uL (ref 4.0–10.5)
nRBC: 0.8 % — ABNORMAL HIGH (ref 0.0–0.2)

## 2023-06-13 LAB — BLOOD GAS, VENOUS
Acid-Base Excess: 8.1 mmol/L — ABNORMAL HIGH (ref 0.0–2.0)
Bicarbonate: 33.7 mmol/L — ABNORMAL HIGH (ref 20.0–28.0)
Drawn by: 68716
O2 Saturation: 60.3 %
Patient temperature: 36.6
pCO2, Ven: 51 mmHg (ref 44–60)
pH, Ven: 7.43 (ref 7.25–7.43)
pO2, Ven: 33 mmHg (ref 32–45)

## 2023-06-13 LAB — TROPONIN I (HIGH SENSITIVITY)
Troponin I (High Sensitivity): 26 ng/L — ABNORMAL HIGH (ref <18)
Troponin I (High Sensitivity): 24 ng/L — ABNORMAL HIGH (ref <18)

## 2023-06-13 LAB — COMPREHENSIVE METABOLIC PANEL
ALT: 15 U/L (ref 0–44)
AST: 22 U/L (ref 15–41)
Albumin: 2.6 g/dL — ABNORMAL LOW (ref 3.5–5.0)
Alkaline Phosphatase: 73 U/L (ref 38–126)
Anion gap: 12 (ref 5–15)
BUN: 16 mg/dL (ref 8–23)
CO2: 29 mmol/L (ref 22–32)
Calcium: 9 mg/dL (ref 8.9–10.3)
Chloride: 101 mmol/L (ref 98–111)
Creatinine, Ser: 0.92 mg/dL (ref 0.44–1.00)
GFR, Estimated: >60 mL/min (ref >60)
Glucose, Bld: 113 mg/dL — ABNORMAL HIGH (ref 70–99)
Potassium: 3.7 mmol/L (ref 3.5–5.1)
Sodium: 142 mmol/L (ref 135–145)
Total Bilirubin: 0.7 mg/dL (ref 0.0–1.2)
Total Protein: 6.2 g/dL — ABNORMAL LOW (ref 6.5–8.1)

## 2023-06-13 LAB — TRANSFUSION REACTION
DAT C3: NEGATIVE
Post RXN DAT IgG: NEGATIVE

## 2023-06-13 MED ORDER — DIPHENHYDRAMINE HCL 25 MG PO CAPS
25.0000 mg | ORAL_CAPSULE | Freq: Once | ORAL | Status: AC
  Administered 2023-06-13: 25 mg via ORAL
  Filled 2023-06-13: qty 1

## 2023-06-13 MED ORDER — CALCIUM CARBONATE ANTACID 500 MG PO CHEW
1.0000 | CHEWABLE_TABLET | Freq: Two times a day (BID) | ORAL | Status: DC
  Administered 2023-06-14 – 2023-06-16 (×5): 200 mg via ORAL
  Filled 2023-06-13 (×5): qty 1

## 2023-06-13 MED ORDER — CARVEDILOL 3.125 MG PO TABS
3.1250 mg | ORAL_TABLET | Freq: Two times a day (BID) | ORAL | Status: DC
  Administered 2023-06-14 – 2023-06-16 (×4): 3.125 mg via ORAL
  Filled 2023-06-13 (×4): qty 1

## 2023-06-13 MED ORDER — LOTEPREDNOL ETABONATE 0.5 % OP SUSP
1.0000 [drp] | Freq: Two times a day (BID) | OPHTHALMIC | Status: DC
  Administered 2023-06-14 – 2023-06-16 (×5): 1 [drp] via OPHTHALMIC
  Filled 2023-06-13 (×2): qty 5

## 2023-06-13 MED ORDER — VITAMIN B-12 1000 MCG PO TABS
2500.0000 ug | ORAL_TABLET | Freq: Every morning | ORAL | Status: DC
  Administered 2023-06-14 – 2023-06-16 (×3): 2500 ug via ORAL
  Filled 2023-06-13 (×3): qty 3

## 2023-06-13 MED ORDER — POLYVINYL ALCOHOL 1.4 % OP SOLN: 1.0000 [drp] | Freq: Every day | OPHTHALMIC | Status: DC | PRN

## 2023-06-13 MED ORDER — CARVEDILOL 3.125 MG PO TABS
3.1250 mg | ORAL_TABLET | Freq: Once | ORAL | Status: AC
  Administered 2023-06-13: 3.125 mg via ORAL
  Filled 2023-06-13 (×3): qty 1

## 2023-06-13 MED ORDER — MIDODRINE HCL 5 MG PO TABS
5.0000 mg | ORAL_TABLET | Freq: Once | ORAL | Status: AC
  Administered 2023-06-13: 5 mg via ORAL
  Filled 2023-06-13: qty 1

## 2023-06-13 MED ORDER — PNEUMOCOCCAL 20-VAL CONJ VACC 0.5 ML IM SUSY
0.5000 mL | PREFILLED_SYRINGE | INTRAMUSCULAR | Status: AC
  Administered 2023-06-14: 0.5 mL via INTRAMUSCULAR
  Filled 2023-06-13: qty 0.5

## 2023-06-13 MED ORDER — PANTOPRAZOLE SODIUM 40 MG PO TBEC
40.0000 mg | DELAYED_RELEASE_TABLET | Freq: Every day | ORAL | Status: DC
  Administered 2023-06-14 – 2023-06-16 (×3): 40 mg via ORAL
  Filled 2023-06-13 (×3): qty 1

## 2023-06-13 MED ORDER — FERROUS SULFATE 325 (65 FE) MG PO TABS
325.0000 mg | ORAL_TABLET | Freq: Every evening | ORAL | Status: DC
  Administered 2023-06-14 – 2023-06-15 (×2): 325 mg via ORAL
  Filled 2023-06-13 (×2): qty 1

## 2023-06-13 MED ORDER — SODIUM CHLORIDE 0.9% FLUSH: 10.0000 mL | INTRAVENOUS | Status: DC | PRN

## 2023-06-13 MED ORDER — ACETAMINOPHEN 325 MG PO TABS
650.0000 mg | ORAL_TABLET | Freq: Once | ORAL | Status: DC
Start: 2023-06-13 — End: 2023-06-13

## 2023-06-13 MED ORDER — DIGOXIN 0.25 MG/ML IJ SOLN: 0.2500 mg | Freq: Once | INTRAMUSCULAR | Status: DC

## 2023-06-13 MED ORDER — CIPROFLOXACIN HCL 0.3 % OP SOLN
1.0000 [drp] | OPHTHALMIC | Status: DC
  Administered 2023-06-14 – 2023-06-16 (×13): 1 [drp] via OPHTHALMIC
  Filled 2023-06-13: qty 2.5

## 2023-06-13 MED ORDER — CHLORHEXIDINE GLUCONATE CLOTH 2 % EX PADS
6.0000 | MEDICATED_PAD | Freq: Every day | CUTANEOUS | Status: DC
  Administered 2023-06-13 – 2023-06-15 (×3): 6 via TOPICAL

## 2023-06-13 MED ORDER — HEPARIN SOD (PORK) LOCK FLUSH 100 UNIT/ML IV SOLN
500.0000 [IU] | Freq: Every day | INTRAVENOUS | Status: DC | PRN
Start: 2023-06-13 — End: 2023-06-13

## 2023-06-13 MED ORDER — FOLIC ACID 1 MG PO TABS
1.0000 mg | ORAL_TABLET | Freq: Every day | ORAL | Status: DC
  Administered 2023-06-14 – 2023-06-16 (×3): 1 mg via ORAL
  Filled 2023-06-13 (×3): qty 1

## 2023-06-13 MED ORDER — MIDODRINE HCL 5 MG PO TABS
10.0000 mg | ORAL_TABLET | Freq: Three times a day (TID) | ORAL | Status: DC
  Administered 2023-06-14 – 2023-06-16 (×8): 10 mg via ORAL
  Filled 2023-06-13 (×9): qty 2

## 2023-06-13 MED ORDER — SODIUM CHLORIDE 0.9% IV SOLUTION
250.0000 mL | INTRAVENOUS | Status: DC
  Administered 2023-06-13: 100 mL via INTRAVENOUS

## 2023-06-13 MED ORDER — MIDODRINE HCL 5 MG PO TABS: 10.0000 mg | ORAL_TABLET | Freq: Three times a day (TID) | ORAL | Status: DC

## 2023-06-13 MED ORDER — ALBUTEROL SULFATE (2.5 MG/3ML) 0.083% IN NEBU: 2.5000 mg | INHALATION_SOLUTION | RESPIRATORY_TRACT | Status: DC | PRN

## 2023-06-13 MED ORDER — RIVAROXABAN 20 MG PO TABS
20.0000 mg | ORAL_TABLET | Freq: Every day | ORAL | Status: DC
  Administered 2023-06-14 – 2023-06-15 (×3): 20 mg via ORAL
  Filled 2023-06-13 (×3): qty 1

## 2023-06-13 NOTE — ED Triage Notes (Signed)
 Pt arrived via POV from Cancer Center upstairs after receiving a unit of PRBCs. Pt reports she is on 2L Nasal Cannula at baseline, but needed 4L Nasal Cannula tight. Pt endorses heavy, tight pressure in her central chest.

## 2023-06-13 NOTE — Discharge Instructions (Addendum)
 Thank you for coming to [facility] Emergency Department. You were seen for chest pain after blood transfusion. We did an exam, labs, and imaging, and these showed your known pleural effusions (fluid around the lung) and cancer. Please continue to use 2-4 L/min of oxygen at home as needed. Please call Dr. Thurston Hole office tomorrow to make a follow up appointment for within 1 week. Please follow up with Dr. Ellin Saba as originally scheduled as well.    Do not hesitate to return to the ED or call 911 if you experience: -Worsening symptoms -Chest pain -Worsening shortness of breath -Lightheadedness, passing out -Fevers/chills -Anything else that concerns you     IMPORTANT INFORMATION: PAY CLOSE ATTENTION   PHYSICIAN DISCHARGE INSTRUCTIONS  Follow with Primary care provider  Mirna Mires, MD  and other consultants as instructed by your Hospitalist Physician  SEEK MEDICAL CARE OR RETURN TO EMERGENCY ROOM IF SYMPTOMS COME BACK, WORSEN OR NEW PROBLEM DEVELOPS   Please note: You were cared for by a hospitalist during your hospital stay. Every effort will be made to forward records to your primary care provider.  You can request that your primary care provider send for your hospital records if they have not received them.  Once you are discharged, your primary care physician will handle any further medical issues. Please note that NO REFILLS for any discharge medications will be authorized once you are discharged, as it is imperative that you return to your primary care physician (or establish a relationship with a primary care physician if you do not have one) for your post hospital discharge needs so that they can reassess your need for medications and monitor your lab values.  Please get a complete blood count and chemistry panel checked by your Primary MD at your next visit, and again as instructed by your Primary MD.  Get Medicines reviewed and adjusted: Please take all your medications with you  for your next visit with your Primary MD  Laboratory/radiological data: Please request your Primary MD to go over all hospital tests and procedure/radiological results at the follow up, please ask your primary care provider to get all Hospital records sent to his/her office.  In some cases, they will be blood work, cultures and biopsy results pending at the time of your discharge. Please request that your primary care provider follow up on these results.  If you are diabetic, please bring your blood sugar readings with you to your follow up appointment with primary care.    Please call and make your follow up appointments as soon as possible.    Also Note the following: If you experience worsening of your admission symptoms, develop shortness of breath, life threatening emergency, suicidal or homicidal thoughts you must seek medical attention immediately by calling 911 or calling your MD immediately  if symptoms less severe.  You must read complete instructions/literature along with all the possible adverse reactions/side effects for all the Medicines you take and that have been prescribed to you. Take any new Medicines after you have completely understood and accpet all the possible adverse reactions/side effects.   Do not drive when taking Pain medications or sleeping medications (Benzodiazepines)  Do not take more than prescribed Pain, Sleep and Anxiety Medications. It is not advisable to combine anxiety,sleep and pain medications without talking with your primary care practitioner  Special Instructions: If you have smoked or chewed Tobacco  in the last 2 yrs please stop smoking, stop any regular Alcohol  and or any  Recreational drug use.  Wear Seat belts while driving.  Do not drive if taking any narcotic, mind altering or controlled substances or recreational drugs or alcohol.

## 2023-06-13 NOTE — ED Provider Notes (Signed)
 [facility] AT [facility] Provider Note   CSN: [id number] Arrival date & time: 06/13/23  1420     History  Chief Complaint  Patient presents with   Chest Pain    Denise [name] is a 73 y.o. female with PMH as listed below who arrived via POV from Cancer Center upstairs after receiving a unit of PRBCs. Pt reports she is on 2L Nasal Cannula at baseline, but needs 4L when she gets up to move around. Patient had been seen by oncologist and Hgb was 8.2, planned to give PRBCs to see if it improves her breathing. After receiving PRBCs today, patient began to endorse heavy, tight pressure in her central chest that went away and she feels back to normal now. A/w mild increased dyspnea. Patient states she has dyspnea now but it is her normal. She denies leg swelling, increased cough, f/c, pain radiation, numbness/tingling.   Per Hem/onc note from clinic with Dr. Kirtland Bouchard on 06/11/23: 1.  Metastatic adenocarcinoma of the lung to the liver and right lung: - She has tolerated last treatment very well. - CT CAP (06/04/2023): Persistent bilateral loculated pleural effusions, right greater than left.  Persistent groundglass opacities in the lungs.  No developing nodal enlargement.  Stable pericardial effusion.  Stable right hepatic lobe liver lesions. - She reports worsening dyspnea on exertion.  Normally uses 2 L/min oxygen via nasal cannula and has to bump it up to 4 L/min on exertion. - We have started her on Medrol Dosepak on 06/07/2023.  She did not notice any improvement in her breathing. - She has a follow-up appointment with Dr. Sherene Sires on Wednesday. - Labs today: Hemoglobin decreased to 8.2.  LFTs are normal.  Creatinine is normal.  She has felt better after her last blood transfusion which improved her breathing.  Will give her 1 unit PRBC.  Will hold her treatment today.  RTC 3 weeks for follow-up.  Past Medical History:  Diagnosis Date   Anemia    Aortic stenosis    Arthritis     Cancer (HCC)    lung cancer 2019   Coronary artery calcification seen on CT scan    Dyspnea    Essential hypertension    GERD (gastroesophageal reflux disease)    History of lung cancer    Stage III adenocarcinoma status post chemoradiation   Port-A-Cath in place 01/06/2019   Type 2 diabetes mellitus (HCC)        Home Medications Prior to Admission medications   Medication Sig Start Date End Date Taking? Authorizing Provider  acetaminophen (TYLENOL) 500 MG tablet Take 500 mg by mouth every 6 (six) hours as needed for moderate pain.    [provider]  albuterol (PROVENTIL) (2.5 MG/3ML) 0.083% nebulizer solution Take 3 mLs (2.5 mg total) by nebulization every 6 (six) hours as needed for wheezing or shortness of breath. 06/07/23   Doreatha Massed, MD  albuterol (VENTOLIN HFA) 108 (90 Base) MCG/ACT inhaler Inhale 2 puffs into the lungs every 6 (six) hours as needed for wheezing or shortness of breath. 01/02/23   Ghimire, Werner Lean, MD  Calcium Carbonate Antacid (TUMS PO) Take 4 tablets by mouth daily.    [provider]  carvedilol (COREG) 3.125 MG tablet Take 1 tablet (3.125 mg total) by mouth 2 (two) times daily. 02/27/23   Strader, Lennart Pall, PA-C  ciprofloxacin (CILOXAN) 0.3 % ophthalmic solution Place 1 drop into both eyes every 4 (four) hours while awake. Administer 1 drop,  every 2 hours, while awake, for 2 days. Then 1 drop, every 4 hours, while awake, for the next 5 days. 04/30/23   Doreatha Massed, MD  Cyanocobalamin (VITAMIN B-12) 2500 MCG SUBL Place 2,500 mcg under the tongue every morning.     [provider]  ferrous sulfate 325 (65 FE) MG tablet Take 325 mg by mouth every evening.    [provider]  folic acid (FOLVITE) 1 MG tablet TAKE 1 TABLET BY MOUTH EVERY DAY 03/19/23   Doreatha Massed, MD  gabapentin (NEURONTIN) 600 MG tablet Take 600 mg by mouth at bedtime. 11/22/22   [provider]  Glycerin-Hypromellose-PEG  400 (DRY EYE RELIEF DROPS OP) Apply 1 drop to eye daily as needed (dry eyes).    [provider]  loteprednol (LOTEMAX) 0.5 % ophthalmic suspension 1 drop 2 (two) times daily. 05/07/23   [provider]  magnesium oxide (MAG-OX) 400 (240 Mg) MG tablet Take 1 tablet (400 mg total) by mouth 2 (two) times daily. 04/16/23   Doreatha Massed, MD  methylPREDNISolone (MEDROL DOSEPAK) 4 MG TBPK tablet Take 6 tablets (24 mg total) by mouth Nightly for 1 day, THEN 5 tablets (20 mg total) Nightly for 1 day, THEN 4 tablets (16 mg total) Nightly for 1 day, THEN 3 tablets (12 mg total) Nightly for 1 day, THEN 2 tablets (8 mg total) Nightly for 1 day, THEN 1 tablet (4 mg total) Nightly for 1 day. 06/07/23 06/13/23  Doreatha Massed, MD  midodrine (PROAMATINE) 5 MG tablet Take 1 tablet (5 mg total) by mouth 3 (three) times daily with meals. 02/27/23   Strader, Lennart Pall, PA-C  Multiple Vitamin (MULITIVITAMIN WITH MINERALS) TABS Take 1 tablet by mouth daily.    [provider]  omeprazole (PRILOSEC) 20 MG capsule Take 20 mg by mouth daily. 11/18/22   [provider]  Potassium Chloride ER 20 MEQ TBCR Take 1 tablet by mouth daily. 01/01/23   [provider]  rivaroxaban (XARELTO) 20 MG TABS tablet Take 1 tablet (20 mg total) by mouth daily with supper. 04/30/23   Doreatha Massed, MD      Allergies    Patient has no known allergies.    Review of Systems   Review of Systems A 10 point review of systems was performed and is negative unless otherwise reported in HPI.  Physical Exam Updated Vital Signs BP (!) 103/57 (BP Location: Right Arm)   Pulse 76   Temp 97.8 F (36.6 C) (Oral)   Resp 20   Ht 5\' 2"  (1.575 m)   Wt 91.6 kg   SpO2 97%   BMI 36.94 kg/m  Physical Exam General: Normal appearing female, lying in bed.  HEENT: PERRLA, Sclera anicteric, MMM, trachea midline.  Cardiology: Irregularly irregular tachycardic rate, no murmurs/rubs/gallops. BL  radial and DP pulses equal bilaterally.  Resp: Appears mildly dyspneic, mildly tachypneic. Speaking in short sentences. On 4L [place]. Coarse and decreased breath sounds bilaterally. No wheezing.  Abd: Soft, non-tender, non-distended. No rebound tenderness or guarding.  GU: Deferred. MSK: No peripheral edema or signs of trauma. Extremities without deformity or TTP. No cyanosis or clubbing. Skin: warm, dry.  Neuro: A&Ox4, CNs II-XII grossly intact. MAEs. Sensation grossly intact.  Psych: Normal mood and affect.   ED Results / Procedures / Treatments   Labs (all labs ordered are listed, but only abnormal results are displayed) Labs Reviewed  CBC WITH DIFFERENTIAL/PLATELET - Abnormal; Notable for the following components:   RBC 3.32 (*)  Hemoglobin 9.6 (*)    HCT 32.9 (*)    MCHC 29.2 (*)    RDW 18.9 (*)    Platelets 607 (*)    nRBC 0.8 (*)    All other components within normal limits  COMPREHENSIVE METABOLIC PANEL - Abnormal; Notable for the following components:   Glucose, Bld 113 (*)    Total Protein 6.2 (*)    Albumin 2.6 (*)    All other components within normal limits  BRAIN NATRIURETIC PEPTIDE - Abnormal; Notable for the following components:   B Natriuretic Peptide 428.0 (*)    All other components within normal limits  BLOOD GAS, VENOUS - Abnormal; Notable for the following components:   Bicarbonate 33.7 (*)    Acid-Base Excess 8.1 (*)    All other components within normal limits  TROPONIN I (HIGH SENSITIVITY) - Abnormal; Notable for the following components:   Troponin I (High Sensitivity) 26 (*)    All other components within normal limits  TROPONIN I (HIGH SENSITIVITY) - Abnormal; Notable for the following components:   Troponin I (High Sensitivity) 24 (*)    All other components within normal limits    EKG EKG Interpretation Date/Time:  Wednesday June 13 2023 14:39:35 EDT Ventricular Rate:  86 PR Interval:  142 QRS Duration:  128 QT Interval:  388 QTC  Calculation: 464 R Axis:   -80  Text Interpretation: Sinus rhythm with Premature atrial complexes Right bundle branch block Left anterior fascicular block Bifascicular block Similar to prior Confirmed by Vivi Barrack [phone]) on 06/13/2023 3:26:31 PM  Radiology CXR: Increased bibasilar atelectasis is noted with associated pleural effusions. Stable left upper lobe opacity is noted concerning for pneumonia or atelectasis.  Procedures .Critical Care  Performed by: Loetta Rough, MD Authorized by: Loetta Rough, MD   Critical care provider statement:    Critical care time (minutes):  40   Critical care was necessary to treat or prevent imminent or life-threatening deterioration of the following conditions:  Circulatory failure   Critical care was time spent personally by me on the following activities:  Development of treatment plan with patient or surrogate, discussions with consultants, evaluation of patient's response to treatment, examination of patient, ordering and review of laboratory studies, ordering and review of radiographic studies, ordering and performing treatments and interventions, pulse oximetry, re-evaluation of patient's condition, review of old charts and obtaining history from patient or surrogate     Medications Ordered in ED Medications  carvedilol (COREG) tablet 3.125 mg (3.125 mg Oral Given 06/13/23 1907)  midodrine (PROAMATINE) tablet 5 mg (5 mg Oral Given 06/13/23 1907)    ED Course/ Medical Decision Making/ A&P                          Medical Decision Making Amount and/or Complexity of Data Reviewed Labs: ordered. Decision-making details documented in ED Course. Radiology: ordered. Decision-making details documented in ED Course.  Risk Prescription drug management. Decision regarding hospitalization.    This patient presents to the ED for concern of CP, dyspnea, Afib w/ RVR, this involves an extensive number of treatment options, and is a complaint  that carries with it a high risk of complications and morbidity.  I considered the following differential and admission for this acute, potentially life threatening condition.   MDM:    Patient was not receiving a high volume transfusion, just a single unit of PRBCs, and she has no e/o volume overload or high  blood pressure to indicate TACO. No SOB or infiltrates on CXR to indicate TRALI. BNP is mildly elevated but decreased from 1 month ago. CXR shows BL pleural effusions, which could be cause of her dyspnea. Patient is having to increase her oxygen at home and while I am talking to her in the room desats to 86% on her home 4L [place], which was already the increased amount of oxygen from what she had been using. Patient is also noted to have Afib w/ RVR, which she does have a history of, and is given her home coreg as well as her home midodrine doses for the evening, but her HR is still intermittently bouncing up into 130s. Her troponins are 26-24, no ischemia on EKG, no c/f ACS. Her Hgb raised approrpiately to 9.6 from the transfusion but her breathing has not improved. Less likely PE and believe more likely the pleural effusions. Do not believe she needs emergent thoracentesis but may benefit from one.   Discussed extensively with the patient and performed shared decision making, and will admit the patient for her dyspnea/Afib w/ RVR.  Marland Kitchen  Clinical Course as of 06/17/23 2226  Wed Jun 13, 2023  1705 B Natriuretic Peptide(!): 428.0 Mildly elevated, decreased from one month ago in 500s [HN]  1706 Troponin I (High Sensitivity)(!): 26 [HN]  1706 Comprehensive metabolic panel(!) Unremarkable in the context of this patient's presentation  [HN]  1706 CBC with Differential(!) Hgb increased to 9.6 appropriately w/ transfusion today. No leukocytosis.  [HN]  1706 DG Chest Port 1 View Increased bibasilar atelectasis is noted with associated pleural effusions. Stable left upper lobe opacity is noted concerning  for pneumonia or atelectasis.   [HN]  1814 Troponin I (High Sensitivity)(!): 24 flat [HN]  1841 LUL opacity has been seen on prior CXRs, likely related to her malignancy, she has no fever, productive cough, or leukocytosis to indicate acute PNA. She repeatedly states that her breathing currently is at her baseline and has been dealing chronically with dyspnea on exertion, bumping up her oxygen at home. Was supposed to have f/u with pulmonology today but was told by blood transfusion staff to go to ED. [HN]  1852 HR in 100s-120s after patient comes out of bathroom, Afib. Does have h/o Afib and takes carvedilol. She states she is due for her evening dose of carvedilol as well as midodrine, and will give those here. [HN]    Clinical Course User Index [HN] Loetta Rough, MD    Labs: I Ordered, and personally interpreted labs.  The pertinent results include:  those listed above  Imaging Studies ordered: I ordered imaging studies including CXR  I independently visualized and interpreted imaging. I agree with the radiologist interpretation  Additional history obtained from chart review.    Cardiac Monitoring: The patient was maintained on a cardiac monitor.  I personally viewed and interpreted the cardiac monitored which showed an underlying rhythm of: Afib w/ RVR  Reevaluation: After the interventions noted above, I reevaluated the patient and found that they have :improved  Social Determinants of Health: Lives independently  Disposition:  Admit to hospitalist  Co morbidities that complicate the patient evaluation  Past Medical History:  Diagnosis Date   Anemia    Aortic stenosis    Arthritis    Cancer (HCC)    lung cancer 2019   Coronary artery calcification seen on CT scan    Dyspnea    Essential hypertension    GERD (gastroesophageal reflux disease)  History of lung cancer    Stage III adenocarcinoma status post chemoradiation   Port-A-Cath in place 01/06/2019   Type  2 diabetes mellitus (HCC)      Medicines No orders of the defined types were placed in this encounter.   I have reviewed the patients home medicines and have made adjustments as needed  Problem List / ED Course: Problem List Items Addressed This Visit   None Visit Diagnoses       Atypical chest pain    -  Primary     Bilateral pleural effusion                       This note was created using dictation software, which may contain spelling or grammatical errors.    Loetta Rough, MD 06/17/23 [phone]

## 2023-06-13 NOTE — Progress Notes (Signed)
 Patient presented today for one unit of PRBC.  At completion of transfusion, patient c/o severe tightness in chest.  HR was elevated at 110. Patient was also SOB, but this is her baseline.  Patient stated SOB was not worse than normal.  Rojelio Brenner, PA-C made aware.   PA at bedside.  Patient evaluated by PA.  PA suggested patient by evaluated in the ED.  Patient transported by NT and RN to the ED in stable condition.

## 2023-06-13 NOTE — Progress Notes (Signed)
 Patient presents today for 1 unit of PRBC's. Patient took Tylenol 650 mg at home prior to arrival at 09:30 am. Benadryl 25 mg given PO at the bedside. Patient has complaints of shortness of breath. MAR reviewed and updated.          Verbal orders received from R. Pennington PA to send patient to the ED due to increased heart rate and shortness of breath, chest pressure and pain.  14:15 Report called to the ER charge nurse Bonita Quin Long).  Patient sent to the ER port-a-cath needle intact. 20 gauge.  Patient taken down by wheel chair by B. Mariel Aloe.

## 2023-06-13 NOTE — H&P (Signed)
 [facility] H&P   Patient Demographics:    Denise [name], is a 73 y.o. female  MRN: [id number]   DOB - Nov 16, 1950  Admit Date - 06/13/2023  Outpatient Primary MD for the patient is Mirna Mires, MD  Referring MD/NP/PA: Dr Jearld Fenton  Outpatient Specialists: oncology Dr. Ellin Saba  Patient coming from: Oncology center  Chief Complaint  Patient presents with   Chest Pain      HPI:    Denise [name]  is a 73 y.o. female,with history of metastatic/stage IV adenocarcinoma of the lung, chronic toxic respiratory failure on 2 L of oxygen, PE, DM-2, HTN, aortic stenosis, with mets to the left lung status postthoracentesis x 2 in October 2024. -Presents to ED from cancer center for further evaluation, patient reports dyspnea for couple weeks, she had CT chest abdomen pelvis 06/04/2023, which showed worsening pleural effusion on the right lung (versus left lung in the past), reports dyspnea, was prescribed Medrol pack with no improvement, so felt anemia contributing with hemoglobin of 8.2, so she went for 1 unit PRBC transfusion without much improvement dyspnea, so she was sent to ED for further evaluation. -In ED her workup significant for hemoglobin at 9.6, BNP at 428, troponins 26 > 24 x-ray showing bilateral pleural effusion, she was desaturating in the mid 80s while on 4 L oxygen so [facility] hospitalist consulted to admit.   Review of systems:      A full 10 point Review of Systems was done, except as stated above, all other Review of Systems were negative.   With Past History of the following :    Past Medical History:  Diagnosis Date   Anemia    Aortic stenosis    Arthritis    Cancer (HCC)    lung cancer 2019   Coronary artery calcification seen on CT scan    Dyspnea    Essential hypertension    GERD (gastroesophageal reflux disease)    History of lung cancer    Stage III adenocarcinoma  status post chemoradiation   Port-A-Cath in place 01/06/2019   Type 2 diabetes mellitus [facility])       Past Surgical History:  Procedure Laterality Date   BIOPSY  09/25/2022   Procedure: BIOPSY;  Surgeon: Lanelle Bal, DO;  [facility];  Service: Endoscopy;;   CHOLECYSTECTOMY  1997   COLONOSCOPY     COLONOSCOPY WITH PROPOFOL N/A 09/25/2022   Procedure: COLONOSCOPY WITH PROPOFOL;  Surgeon: Lanelle Bal, DO;  [facility];  Service: Endoscopy;  Laterality: N/A;  11:00;ASA 3   ESOPHAGEAL BRUSHING  09/25/2022   Procedure: ESOPHAGEAL BRUSHING;  Surgeon: Lanelle Bal, DO;  [facility];  Service: Endoscopy;;   ESOPHAGOGASTRODUODENOSCOPY (EGD) WITH PROPOFOL N/A 09/25/2022   Procedure: ESOPHAGOGASTRODUODENOSCOPY (EGD) WITH PROPOFOL;  Surgeon: Lanelle Bal, DO;  [facility];  Service: Endoscopy;  Laterality: N/A;  11:00AM;ASA 3   GASTRIC BYPASS     INCISIONAL HERNIA REPAIR  04/11/11   IR IMAGING GUIDED PORT INSERTION  12/27/2018   Right   IR US GUIDE BX ASP/DRAIN  08/08/2021   LAPAROSCOPIC SALPINGOOPHERECTOMY     LAPAROTOMY  04/11/2011   Procedure: EXPLORATORY LAPAROTOMY;  Surgeon: Clovis Pu. Cornett, MD;  Location: [facility];  Service: General;  Laterality: N/A;  closure port hole   POLYPECTOMY  09/25/2022   Procedure: POLYPECTOMY;  Surgeon: Lanelle Bal, DO;  [facility];  Service: Endoscopy;;   RIGHT/LEFT HEART CATH AND CORONARY ANGIOGRAPHY N/A 12/29/2019   Procedure: RIGHT/LEFT HEART CATH AND CORONARY ANGIOGRAPHY;  Surgeon: Lennette Bihari, MD;  Location: [facility];  Service: Cardiovascular;  Laterality: N/A;   TOTAL HIP ARTHROPLASTY  03/07/2012   Procedure: TOTAL HIP ARTHROPLASTY ANTERIOR APPROACH;  Surgeon: Shelda Pal, MD;  Location: [facility];  Service: Orthopedics;  Laterality: Right;   TOTAL SHOULDER ARTHROPLASTY Left 01/21/2015   TOTAL SHOULDER ARTHROPLASTY Left 01/21/2015   Procedure: LEFT TOTAL SHOULDER  ARTHROPLASTY;  Surgeon: Francena Hanly, MD;  Location: [facility];  Service: Orthopedics;  Laterality: Left;   VAGINAL HYSTERECTOMY        Social History:     Social History   Tobacco Use   Smoking status: Former    Current packs/day: 0.00    Average packs/day: 1 pack/day for 12.0 years (12.0 ttl pk-yrs)    Types: Cigarettes    Start date: 03/20/1964    Quit date: 03/20/1976    Years since quitting: 47.2    Passive exposure: Past   Smokeless tobacco: Never  Substance Use Topics   Alcohol use: No      Family History :     Family History  Problem Relation Age of Onset   Breast cancer Mother    COPD Mother    Arthritis Mother    Diabetes Mother    Hypertension Mother    Hypertension Father    Diabetes Father    Breast cancer Sister    Thyroid cancer Brother    Heart attack Brother    Huntington's disease Maternal Grandmother      Home Medications:   Prior to Admission medications   Medication Sig Start Date End Date Taking? Authorizing Provider  acetaminophen (TYLENOL) 500 MG tablet Take 500 mg by mouth every 6 (six) hours as needed for moderate pain.   Yes [provider]  albuterol (PROVENTIL) (2.5 MG/3ML) 0.083% nebulizer solution Take 3 mLs (2.5 mg total) by nebulization every 6 (six) hours as needed for wheezing or shortness of breath. 06/07/23  Yes Doreatha Massed, MD  albuterol (VENTOLIN HFA) 108 (90 Base) MCG/ACT inhaler Inhale 2 puffs into the lungs every 6 (six) hours as needed for wheezing or shortness of breath. 01/02/23  Yes Ghimire, Werner Lean, MD  Calcium Carbonate Antacid (TUMS PO) Take 2-3 tablets by mouth daily.   Yes [provider]  carvedilol (COREG) 3.125 MG tablet Take 1 tablet (3.125 mg total) by mouth 2 (two) times daily. 02/27/23  Yes Strader, [place] M, PA-C  ciprofloxacin (CILOXAN) 0.3 % ophthalmic solution Place 1 drop into both eyes every 4 (four) hours while awake. Administer 1 drop, every 2 hours, while awake, for 2 days. Then  1 drop, every 4 hours, while awake, for the next 5 days. 04/30/23  Yes Doreatha Massed, MD  Cyanocobalamin (VITAMIN B-12) 2500 MCG SUBL Place 2,500 mcg under the tongue every morning.  Yes [provider]  ferrous sulfate 325 (65 FE) MG tablet Take 325 mg by mouth every evening.   Yes [provider]  folic acid (FOLVITE) 1 MG tablet TAKE 1 TABLET BY MOUTH EVERY DAY 03/19/23  Yes Doreatha Massed, MD  gabapentin (NEURONTIN) 600 MG tablet Take 600 mg by mouth at bedtime. 11/22/22  Yes [provider]  Glycerin-Hypromellose-PEG 400 (DRY EYE RELIEF DROPS OP) Apply 1 drop to eye daily as needed (dry eyes).   Yes [provider]  loteprednol (LOTEMAX) 0.5 % ophthalmic suspension 1 drop 2 (two) times daily. 05/07/23  Yes [provider]  magnesium oxide (MAG-OX) 400 MG tablet Take 1 tablet by mouth 2 (two) times daily. 05/22/23  Yes [provider]  midodrine (PROAMATINE) 5 MG tablet Take 1 tablet (5 mg total) by mouth 3 (three) times daily with meals. 02/27/23  Yes Strader, [place] M, PA-C  Multiple Vitamin (MULITIVITAMIN WITH MINERALS) TABS Take 1 tablet by mouth daily.   Yes [provider]  omeprazole (PRILOSEC) 20 MG capsule Take 20 mg by mouth daily. 11/18/22  Yes [provider]  Potassium Chloride ER 20 MEQ TBCR Take 1 tablet by mouth daily. 01/01/23  Yes [provider]  rivaroxaban (XARELTO) 20 MG TABS tablet Take 1 tablet (20 mg total) by mouth daily with supper. 04/30/23  Yes Doreatha Massed, MD  methylPREDNISolone (MEDROL DOSEPAK) 4 MG TBPK tablet Take 6 tablets (24 mg total) by mouth Nightly for 1 day, THEN 5 tablets (20 mg total) Nightly for 1 day, THEN 4 tablets (16 mg total) Nightly for 1 day, THEN 3 tablets (12 mg total) Nightly for 1 day, THEN 2 tablets (8 mg total) Nightly for 1 day, THEN 1 tablet (4 mg total) Nightly for 1 day. Patient not taking: Reported on 06/13/2023 06/07/23 06/13/23  Doreatha Massed, MD     Allergies:    No Known Allergies   Physical Exam:   Vitals  Blood pressure 102/69, pulse (!) 118, temperature 97.8 F (36.6 C), temperature source Oral, resp. rate (!) 22, height 5\' 2"  (1.575 m), weight 91.6 kg, SpO2 97%.   1. General Developed female, laying in bed, in no apparent distress  2. Normal affect and insight, Not Suicidal or Homicidal, Awake Alert, Oriented X 3.  3. No F.N deficits, ALL C.Nerves Intact, Strength 5/5 all 4 extremities, Sensation intact all 4 extremities, Plantars down going.  4. Ears and Eyes appear Normal, Conjunctivae clear, PERRLA. Moist Oral Mucosa.  5. Supple Neck,, No cervical lymphadenopathy appriciated, No Carotid Bruits.  6. Symmetrical Chest wall movement, air entry bilaterally, right more than left, with scattered Rales  7. RRR, No Gallops, Rubs, systolic murmurs present, No Parasternal Heave.  Difficult bilateral lower extremity edema +2  8. Positive Bowel Sounds, Abdomen Soft, No tenderness, No organomegaly appriciated,No rebound -guarding or rigidity.  9.  No Cyanosis, Normal Skin Turgor, No Skin Rash or Bruise.  10. Good muscle tone,  joints appear normal , no effusions, Normal ROM.    Data Review:    CBC Recent Labs  Lab 06/11/23 1300 06/13/23 1514  WBC 10.3 8.6  HGB 8.2* 9.6*  HCT 29.0* 32.9*  PLT 597* 607*  MCV 101.0* 99.1  MCH 28.6 28.9  MCHC 28.3* 29.2*  RDW 18.1* 18.9*  LYMPHSABS 0.6* 1.1  MONOABS 0.7 0.9  EOSABS 0.0 0.1  BASOSABS 0.0 0.0   ------------------------------------------------------------------------------------------------------------------  Chemistries  Recent Labs  Lab 06/11/23 1300 06/13/23 1514  NA 140 142  K 4.1 3.7  CL 102 101  CO2 29 29  GLUCOSE 117* 113*  BUN 19 16  CREATININE 0.91 0.92  CALCIUM 9.2 9.0  MG 2.1  --   AST 21 22  ALT 16 15  ALKPHOS 76 73  BILITOT 0.3 0.7    ------------------------------------------------------------------------------------------------------------------ estimated creatinine clearance is 59.1 mL/min (by C-G formula based on SCr of 0.92 mg/dL). ------------------------------------------------------------------------------------------------------------------ No results for input(s): "TSH", "T4TOTAL", "T3FREE", "THYROIDAB" in the last 72 hours.  Invalid input(s): "FREET3"  Coagulation profile No results for input(s): "INR", "PROTIME" in the last 168 hours. ------------------------------------------------------------------------------------------------------------------- No results for input(s): "DDIMER" in the last 72 hours. -------------------------------------------------------------------------------------------------------------------  Cardiac Enzymes No results for input(s): "CKMB", "TROPONINI", "MYOGLOBIN" in the last 168 hours.  Invalid input(s): "CK" ------------------------------------------------------------------------------------------------------------------    Component Value Date/Time   BNP 428.0 (H) 06/13/2023 1515     ---------------------------------------------------------------------------------------------------------------  Urinalysis    Component Value Date/Time   COLORURINE COLORLESS (A) 04/19/2023 0320   APPEARANCEUR CLEAR 04/19/2023 0320   LABSPEC 1.010 04/19/2023 0320   PHURINE 5.0 04/19/2023 0320   GLUCOSEU NEGATIVE 04/19/2023 0320   HGBUR NEGATIVE 04/19/2023 0320   BILIRUBINUR NEGATIVE 04/19/2023 0320   KETONESUR NEGATIVE 04/19/2023 0320   PROTEINUR NEGATIVE 04/19/2023 0320   UROBILINOGEN 1.0 03/05/2012 1200   NITRITE NEGATIVE 04/19/2023 0320   LEUKOCYTESUR NEGATIVE 04/19/2023 0320    ----------------------------------------------------------------------------------------------------------------   Imaging Results:    DG Chest Port 1 View Result Date: 06/13/2023 CLINICAL DATA:   Shortness of breath and chest pain. History of lung cancer. EXAM: PORTABLE CHEST 1 VIEW COMPARISON:  April 16, 2023. FINDINGS: Stable cardiomegaly. Right internal jugular Port-A-Cath is unchanged. Status post left shoulder arthroplasty. Increased bibasilar atelectasis is noted with associated pleural effusions continued left upper lobe airspace opacity is noted concerning for pneumonia. IMPRESSION: Increased bibasilar atelectasis is noted with associated pleural effusions. Stable left upper lobe opacity is noted concerning for pneumonia or atelectasis. Electronically Signed   By: Lupita Raider M.D.   On: 06/13/2023 16:43   EKG Vent. rate 86 BPM PR interval 142 ms QRS duration 128 ms QT/QTcB 388/464 ms P-R-T axes 50 -80 -19 Sinus rhythm with Premature atrial complexes Right bundle branch block Left anterior fascicular block Bifascicular block Abnormal ECG When compared with ECG of 29-JAN    Assessment & Plan:    Principal Problem:   Acute on chronic hypoxic respiratory failure (HCC) Active Problems:   Atrial fibrillation with RVR (HCC)   Essential hypertension   Class 3 obesity   Adenocarcinoma of left lung (HCC)    Acute on chronic hypoxic respiratory failure Pleural effusion right> left -He is on 2 L nasal cannula at baseline, currently satting in the mid 80s with minimal movement while 4 L. -This is most likely related to her pleural effusion, she had thoracentesis x 2 last October and the left side, workup concerning for exudative then, but no cytology were sent, this time appears to be more significant on the right side, so we will request ultrasound thoracentesis (can be done on Friday), will send cytology this time is unclear related to volume overload versus malignancy -Try to push with diuresis, but given her soft blood pressure this is going to be problematic, will increase her midodrine and will blood pressure allows will start on diuresis as tolerated  Metastatic  adenocarcinoma of the lung to the liver, and right lung -Regiment per oncology, she is currently on chemotherapy -New right pleural effusion, concerning for metastasis, please see above discussion  DVT Prophylaxis Xarelto  Paroxysmal A-fib with RVR -Continue with Coreg for heart rate control, she had intermittently elevated heart rate, the plan was to give digoxin, but currently converted back to normal sinus rhythm, rate controlled -Continue with full anticoagulation  History of pulmonary embolism -Continue with Xarelto  Hypertension/hypotension -Blood pressure is soft, continue with midodrine, continue with Coreg mainly for heart rate control  Acute on chronic diastolic CHF -Will need further diuresis with blood pressure allows  Aortic stenosis chronic issue followed by cardiology   Normocytic anemia Secondary to malignancy and chemotherapy, transfuse 1 unit PRBC today at oncology center    Obesity class II Body mass index is 36.94 kg/m.     AM Labs Ordered, also please review Full Orders  Family Communication: Admission, patients condition and plan of care including tests being ordered have been discussed with the patient  who indicate understanding and agree with the plan and Code Status.  Code Status full code  Likely DC to home  Consults called: None  Admission status: Inpatient  Time spent in minutes : 65-minute   Huey Bienenstock M.D on 06/13/2023 at 9:52 PM   [facility] - Office  [phone]

## 2023-06-13 NOTE — Patient Instructions (Signed)

## 2023-06-14 DIAGNOSIS — C3492 Malignant neoplasm of unspecified part of left bronchus or lung: Secondary | ICD-10-CM

## 2023-06-14 DIAGNOSIS — I10 Essential (primary) hypertension: Secondary | ICD-10-CM

## 2023-06-14 DIAGNOSIS — J9621 Acute and chronic respiratory failure with hypoxia: Secondary | ICD-10-CM | POA: Diagnosis not present

## 2023-06-14 DIAGNOSIS — I4891 Unspecified atrial fibrillation: Secondary | ICD-10-CM

## 2023-06-14 LAB — CBC
HCT: 30.4 % — ABNORMAL LOW (ref 36.0–46.0)
Hemoglobin: 9 g/dL — ABNORMAL LOW (ref 12.0–15.0)
MCH: 29.5 pg (ref 26.0–34.0)
MCHC: 29.6 g/dL — ABNORMAL LOW (ref 30.0–36.0)
MCV: 99.7 fL (ref 80.0–100.0)
Platelets: 509 10*3/uL — ABNORMAL HIGH (ref 150–400)
RBC: 3.05 MIL/uL — ABNORMAL LOW (ref 3.87–5.11)
RDW: 18.9 % — ABNORMAL HIGH (ref 11.5–15.5)
WBC: 9.2 10*3/uL (ref 4.0–10.5)
nRBC: 0.2 % (ref 0.0–0.2)

## 2023-06-14 LAB — BASIC METABOLIC PANEL WITH GFR
Anion gap: 8 (ref 5–15)
BUN: 12 mg/dL (ref 8–23)
CO2: 30 mmol/L (ref 22–32)
Calcium: 8.5 mg/dL — ABNORMAL LOW (ref 8.9–10.3)
Chloride: 105 mmol/L (ref 98–111)
Creatinine, Ser: 0.83 mg/dL (ref 0.44–1.00)
GFR, Estimated: >60 mL/min (ref >60)
Glucose, Bld: 82 mg/dL (ref 70–99)
Potassium: 3.6 mmol/L (ref 3.5–5.1)
Sodium: 143 mmol/L (ref 135–145)

## 2023-06-14 LAB — TYPE AND SCREEN
ABO/RH(D): O POS
Antibody Screen: NEGATIVE
Unit division: 0

## 2023-06-14 LAB — BPAM RBC
Blood Product Expiration Date: 202504092359
ISSUE DATE / TIME: 202503261220
Unit Type and Rh: 202504092359
Unit Type and Rh: 5100

## 2023-06-14 LAB — MRSA NEXT GEN BY PCR, NASAL: MRSA by PCR Next Gen: DETECTED — AB

## 2023-06-14 MED ORDER — MAGNESIUM OXIDE -MG SUPPLEMENT 400 (240 MG) MG PO TABS
400.0000 mg | ORAL_TABLET | Freq: Two times a day (BID) | ORAL | Status: DC
  Administered 2023-06-14 – 2023-06-16 (×5): 400 mg via ORAL
  Filled 2023-06-14 (×5): qty 1

## 2023-06-14 MED ORDER — POTASSIUM CHLORIDE CRYS ER 20 MEQ PO TBCR
20.0000 meq | EXTENDED_RELEASE_TABLET | Freq: Every day | ORAL | Status: DC
  Administered 2023-06-14 – 2023-06-16 (×3): 20 meq via ORAL
  Filled 2023-06-14 (×3): qty 1

## 2023-06-14 MED ORDER — CHLORHEXIDINE GLUCONATE CLOTH 2 % EX PADS
6.0000 | MEDICATED_PAD | Freq: Every day | CUTANEOUS | Status: DC
  Administered 2023-06-14 – 2023-06-15 (×2): 6 via TOPICAL

## 2023-06-14 MED ORDER — GABAPENTIN 300 MG PO CAPS
600.0000 mg | ORAL_CAPSULE | Freq: Every day | ORAL | Status: DC
  Administered 2023-06-14 – 2023-06-15 (×2): 600 mg via ORAL
  Filled 2023-06-14 (×2): qty 2

## 2023-06-14 MED ORDER — MUPIROCIN 2 % EX OINT
1.0000 | TOPICAL_OINTMENT | Freq: Two times a day (BID) | CUTANEOUS | Status: DC
  Administered 2023-06-14 – 2023-06-16 (×5): 1 via NASAL
  Filled 2023-06-14: qty 22

## 2023-06-14 MED ORDER — ADULT MULTIVITAMIN W/MINERALS CH
1.0000 | ORAL_TABLET | Freq: Every day | ORAL | Status: DC
  Administered 2023-06-14 – 2023-06-16 (×3): 1 via ORAL
  Filled 2023-06-14 (×3): qty 1

## 2023-06-14 NOTE — Plan of Care (Signed)
  Problem: Acute Rehab OT Goals (only OT should resolve) Goal: Pt. Will Perform Grooming Flowsheets (Taken 06/14/2023 0947) Pt Will Perform Grooming:  with modified independence  standing Goal: Pt. Will Perform Lower Body Dressing Flowsheets (Taken 06/14/2023 0947) Pt Will Perform Lower Body Dressing: with min assist Goal: Pt. Will Transfer To Toilet Flowsheets (Taken 06/14/2023 [phone]) Pt Will Transfer to Toilet:  with modified independence  ambulating Goal: Pt. Will Perform Toileting-Clothing Manipulation Flowsheets (Taken 06/14/2023 0947) Pt Will Perform Toileting - Clothing Manipulation and hygiene:  with modified independence  sitting/lateral leans Goal: Pt/Caregiver Will Perform Home Exercise Program Flowsheets (Taken 06/14/2023 [phone]) Pt/caregiver will Perform Home Exercise Program:  Increased ROM  Increased strength  Both right and left upper extremity  Independently  Myrl Lazarus OT, MOT

## 2023-06-14 NOTE — Plan of Care (Signed)

## 2023-06-14 NOTE — Progress Notes (Signed)
 PROGRESS NOTE   Denise [name]  ZHY:[id number] DOB: 1950-07-07 DOA: 06/13/2023 PCP: Mirna Mires, MD   Chief Complaint  Patient presents with   Chest Pain   Level of care: Stepdown  Brief Admission History:  73 y.o. female,with history of metastatic/stage IV adenocarcinoma of the lung, chronic toxic respiratory failure on 2 L of oxygen, PE, DM-2, HTN, aortic stenosis, with mets to the left lung status postthoracentesis x 2 in October 2024.  Presents to ED from cancer center for further evaluation, patient reports dyspnea for couple weeks, she had CT chest abdomen pelvis 06/04/2023, which showed worsening pleural effusion on the right lung (versus left lung in the past), reports dyspnea, was prescribed Medrol pack with no improvement, so felt anemia contributing with hemoglobin of 8.2, so she went for 1 unit PRBC transfusion without much improvement dyspnea, so she was sent to ED for further evaluation.  In ED her workup significant for hemoglobin at 9.6, BNP at 428, troponins 26 > 24 x-ray showing bilateral pleural effusion, she was desaturating in the mid 80s while on 4 L oxygen so [facility] hospitalist consulted to admit.   Assessment and Plan:  Acute on chronic hypoxic respiratory failure Pleural effusion right> left -Pt reports 2-4 L nasal cannula oxygen requirement at baseline, 2L at rest and 4L with ambulation -seems to be at baseline when resting, but severely exacerbated symptoms with minimal activity -pt agreeable to thoracentesis -thoracentesis scheduled for 3/28 per notes   Metastatic adenocarcinoma of the lung to the liver, and right lung -Regiment per oncology, she is currently on chemotherapy -Right pleural effusion, concerning for metastasis   Paroxysmal A-fib with RVR -Continue with Coreg for heart rate control, she had intermittently elevated heart rate, the plan was to give digoxin, but currently converted back to normal sinus rhythm, rate controlled -Continue with full  anticoagulation (rivaroxaban)   History of pulmonary embolism -Continue with rivaroxaban   Hypertension/hypotension -Blood pressure is soft, continue with midodrine, continue with Coreg mainly for heart rate control   Chronic HFpEF -appears compensated, following clinically    Aortic stenosis chronic issue followed by cardiology   Normocytic anemia Secondary to malignancy and chemotherapy, transfused 1 unit PRBC 3/26 at oncology center    Obesity class II Body mass index is 36.94 kg/m.  DVT prophylaxis: rivaroxaban Code Status: Full  Family Communication:  Disposition: anticipate home    Consultants:   Procedures:  US thoracentesis planned   Antimicrobials:    Subjective: Pt says she is resting better but heart rates labile, no chest pain, chronic SOB.  Denies fever or chills.   Objective: Vitals:   06/14/23 0600 06/14/23 0700 06/14/23 0821 06/14/23 0822  BP: (!) 117/50 (!) 116/54    Pulse: 76 75 83 84  Resp: (!) 7 16 (!) 24 (!) 25  Temp:  97.9 F (36.6 C)    TempSrc:  Oral    SpO2: 100% 100% 90% 96%  Weight:      Height:       No intake or output data in the 24 hours ending 06/14/23 1025 Filed Weights   06/13/23 1435  Weight: 91.6 kg   Examination:  General exam: Appears calm and comfortable, sitting in chair;   Respiratory system: diminished BS bilateral bases R>L. Cardiovascular system: irregularly irregular at times. No JVD, trace pedal edema. Gastrointestinal system: Abdomen is nondistended, soft and nontender. No organomegaly or masses felt. Normal bowel sounds heard. Central nervous system: Alert and oriented. No focal neurological deficits. Extremities: Symmetric  5 x 5 power. Skin: No rashes, lesions or ulcers. Psychiatry: Judgement and insight appear normal. Mood & affect appropriate.   Data Reviewed: I have personally reviewed following labs and imaging studies  CBC: Recent Labs  Lab 06/11/23 1300 06/13/23 1514 06/14/23 0433  WBC 10.3  8.6 9.2  NEUTROABS 8.9* 6.4  --   HGB 8.2* 9.6* 9.0*  HCT 29.0* 32.9* 30.4*  MCV 101.0* 99.1 99.7  PLT 597* 607* 509*    Basic Metabolic Panel: Recent Labs  Lab 06/11/23 1300 06/13/23 1514 06/14/23 0433  NA 140 142 143  K 4.1 3.7 3.6  CL 102 101 105  CO2 29 29 30   GLUCOSE 117* 113* 82  BUN 19 16 12   CREATININE 0.91 0.92 0.83  CALCIUM 9.2 9.0 8.5*  MG 2.1  --   --     CBG: No results for input(s): "GLUCAP" in the last 168 hours.  Recent Results (from the past 240 hours)  MRSA Next Gen by PCR, Nasal     Status: Abnormal   Collection Time: 06/13/23  9:51 PM   Specimen: Nasal Mucosa; Nasal Swab  Result Value Ref Range Status   MRSA by PCR Next Gen DETECTED (A) NOT DETECTED Final    Comment: RESULT CALLED TO, READ BACK BY AND VERIFIED WITH: CODY KINDLE, RN AT [phone] 06/14/2023 BY A. SNYDER (NOTE) The GeneXpert MRSA Assay (FDA approved for NASAL specimens only), is one component of a comprehensive MRSA colonization surveillance program. It is not intended to diagnose MRSA infection nor to guide or monitor treatment for MRSA infections. Test performance is not FDA approved in patients less than 21 years old. Performed at [facility], [address], [place], [place] [address]      Radiology Studies: [facility] Chest [facility] 1 View Result Date: 06/13/2023 CLINICAL DATA:  Shortness of breath and chest pain. History of lung cancer. EXAM: PORTABLE CHEST 1 VIEW COMPARISON:  April 16, 2023. FINDINGS: Stable cardiomegaly. Right internal jugular Port-A-Cath is unchanged. Status post left shoulder arthroplasty. Increased bibasilar atelectasis is noted with associated pleural effusions continued left upper lobe airspace opacity is noted concerning for pneumonia. IMPRESSION: Increased bibasilar atelectasis is noted with associated pleural effusions. Stable left upper lobe opacity is noted concerning for pneumonia or atelectasis. Electronically Signed   By: Lupita Raider M.D.   On: 06/13/2023  16:43    Scheduled Meds:  calcium carbonate  1 tablet Oral BID WC   carvedilol  3.125 mg Oral BID   Chlorhexidine Gluconate Cloth  6 each Topical Daily   Chlorhexidine Gluconate Cloth  6 each Topical Daily   ciprofloxacin  1 drop Both Eyes Q4H while awake   cyanocobalamin  2,500 mcg Oral q morning   ferrous sulfate  325 mg Oral QPM   folic acid  1 mg Oral Daily   loteprednol  1 drop Both Eyes BID   midodrine  10 mg Oral TID WC   mupirocin ointment  1 Application Nasal BID   pantoprazole  40 mg Oral Daily   rivaroxaban  20 mg Oral Q supper   Continuous Infusions:   LOS: 1 day   Time spent: 55 mins  Naiomi Musto Laural Benes, MD How to contact the [facility] Attending or Consulting provider 7A - 7P or covering provider during after hours 7P -7A, for this patient?  Check the care team in [facility] and look for a) attending/consulting [facility] provider listed and b) the [facility] team listed Log into [email or web address] to find provider on call.  Locate the [facility] provider you are looking for under [facility] and page to a number that you can be directly reached. If you still have difficulty reaching the provider, please page the [facility] (Director on Call) for the Hospitalists listed on amion for assistance.  06/14/2023, 10:25 AM

## 2023-06-14 NOTE — Plan of Care (Signed)
  Problem: Acute Rehab PT Goals(only PT should resolve) Goal: Pt Will Go Supine/Side To Sit Outcome: Progressing Flowsheets (Taken 06/14/2023 1156) Pt will go Supine/Side to Sit:  with supervision  with contact guard assist Goal: Patient Will Transfer Sit To/From Stand Outcome: Progressing Flowsheets (Taken 06/14/2023 1156) Patient will transfer sit to/from stand:  with modified independence  with supervision Goal: Pt Will Transfer Bed To Chair/Chair To Bed Outcome: Progressing Flowsheets (Taken 06/14/2023 1156) Pt will Transfer Bed to Chair/Chair to Bed:  with modified independence  with supervision Goal: Pt Will Ambulate Outcome: Progressing Flowsheets (Taken 06/14/2023 1156) Pt will Ambulate:  50 feet  with supervision  with modified independence  with rolling walker   11:57 AM, 06/14/23 Ocie Bob, MPT Physical Therapist with [facility] [phone] office [phone] mobile phone

## 2023-06-14 NOTE — Hospital Course (Signed)
 73 y.o. female,with history of metastatic/stage IV adenocarcinoma of the lung, chronic toxic respiratory failure on 2 L of oxygen, PE, DM-2, HTN, aortic stenosis, with mets to the left lung status postthoracentesis x 2 in October 2024.  Presents to ED from cancer center for further evaluation, patient reports dyspnea for couple weeks, she had CT chest abdomen pelvis 06/04/2023, which showed worsening pleural effusion on the right lung (versus left lung in the past), reports dyspnea, was prescribed Medrol pack with no improvement, so felt anemia contributing with hemoglobin of 8.2, so she went for 1 unit PRBC transfusion without much improvement dyspnea, so she was sent to ED for further evaluation.  In ED her workup significant for hemoglobin at 9.6, BNP at 428, troponins 26 > 24 x-ray showing bilateral pleural effusion, she was desaturating in the mid 80s while on 4 L oxygen so [facility] hospitalist consulted to admit.

## 2023-06-14 NOTE — Evaluation (Signed)
 Occupational Therapy Evaluation Patient Details Name: Denise [name] MRN: [id number] DOB: 1951/02/19 Today's Date: 06/14/2023   History of Present Illness   Denise [name]  is a 73 y.o. female,with history of metastatic/stage IV adenocarcinoma of the lung, chronic toxic respiratory failure on 2 L of oxygen, PE, DM-2, HTN, aortic stenosis, with mets to the left lung status postthoracentesis x 2 in October 2024.  -Presents to ED from cancer center for further evaluation, patient reports dyspnea for couple weeks, she had CT chest abdomen pelvis 06/04/2023, which showed worsening pleural effusion on the right lung (versus left lung in the past), reports dyspnea, was prescribed Medrol pack with no improvement, so felt anemia contributing with hemoglobin of 8.2, so she went for 1 unit PRBC transfusion without much improvement dyspnea, so she was sent to ED for further evaluation.  -In ED her workup significant for hemoglobin at 9.6, BNP at 428, troponins 26 > 24 x-ray showing bilateral pleural effusion, she was desaturating in the mid 80s while on 4 L oxygen so [facility] hospitalist consulted to admit. (per MD)     Clinical Impressions Pt agreeable to OT and PT co-evaluation. Pt mostly limited by SOB today. Noted to desaturate to ~84% SpO2 when ambulating in the room despite being on 3 L supplemental O2. Baseline R shoulder injury and general weakness. Supervision to [facility] for mobility. Pt left in the bed with call bell within reach. Pt will benefit from continued OT in the hospital and recommended venue below to increase strength, balance, and endurance for safe ADL's.        If plan is discharge home, recommend the following:   A little help with walking and/or transfers;A little help with bathing/dressing/bathroom;Assistance with cooking/housework;Assist for transportation;Help with stairs or ramp for entrance     Functional Status Assessment   Patient has had a recent decline in their functional status and  demonstrates the ability to make significant improvements in function in a reasonable and predictable amount of time.     Equipment Recommendations   None recommended by OT             Precautions/Restrictions   Precautions Precautions: Fall Recall of Precautions/Restrictions: Intact Restrictions Weight Bearing Restrictions Per Provider Order: No     Mobility Bed Mobility Overal bed mobility: Needs Assistance Bed Mobility: Supine to Sit     Supine to sit: Supervision     General bed mobility comments: mild labored movement    Transfers Overall transfer level: Needs assistance Equipment used: Rolling walker (2 wheels) Transfers: Sit to/from Stand, Bed to chair/wheelchair/BSC Sit to Stand: Supervision, Contact guard assist     Step pivot transfers: Supervision, Contact guard assist     General transfer comment: mild labored movement; SOB      Balance Overall balance assessment: Needs assistance Sitting-balance support: No upper extremity supported, Feet supported Sitting balance-Leahy Scale: Good Sitting balance - Comments: seated at EOB   Standing balance support: Bilateral upper extremity supported, During functional activity, Reliant on assistive device for balance Standing balance-Leahy Scale: Fair Standing balance comment: using RW                           ADL either performed or assessed with clinical judgement   ADL Overall ADL's : Needs assistance/impaired     Grooming: Standing;Supervision/safety;Contact guard assist   Upper Body Bathing: Set up;Sitting   Lower Body Bathing: Maximal assistance;Sitting/lateral leans Lower Body Bathing Details (indicate cue type and  reason): assisted for lower body dressing at baseline. Upper Body Dressing : Set up;Sitting   Lower Body Dressing: Maximal assistance;Sitting/lateral leans Lower Body Dressing Details (indicate cue type and reason): assisted for lower body dressing at  baseline. Toilet Transfer: Supervision/safety;Contact guard assist;Rolling walker (2 wheels);Ambulation Toilet Transfer Details (indicate cue type and reason): simulated via ambulation in the room Toileting- Clothing Manipulation and Hygiene: Set up;Sitting/lateral lean;Contact guard assist       Functional mobility during ADLs: Supervision/safety;Contact guard assist;Rolling walker (2 wheels)       Vision Baseline Vision/History: 1 Wears glasses Ability to See in Adequate Light: 1 Impaired Patient Visual Report: No change from baseline Vision Assessment?: No apparent visual deficits Additional Comments: other than baseline     Perception Perception: Not tested       Praxis Praxis: WFL       Pertinent Vitals/Pain Pain Assessment Pain Assessment: No/denies pain     Extremity/Trunk Assessment Upper Extremity Assessment Upper Extremity Assessment: RUE deficits/detail;LUE deficits/detail RUE Deficits / Details: Limited by baseline shoulder injury. 2+/5. Generally weak otherwise. LUE Deficits / Details: Generally weak.   Lower Extremity Assessment Lower Extremity Assessment: Defer to PT evaluation   Cervical / Trunk Assessment Cervical / Trunk Assessment: Normal   Communication Communication Communication: No apparent difficulties   Cognition Arousal: Alert Behavior During Therapy: WFL for tasks assessed/performed Cognition: No apparent impairments                               Following commands: Intact       Cueing  General Comments   Cueing Techniques: Verbal cues                 Home Living Family/patient expects to be discharged to:: Private residence Living Arrangements: Other relatives Available Help at Discharge: Family;Available PRN/intermittently Type of Home: House Home Access: Ramped entrance     Home Layout: One level     Bathroom Shower/Tub: Producer, television/film/video: Standard Bathroom Accessibility: Yes    Home Equipment: Shower seat - built in;Grab bars - tub/shower;Lift chair;Cane - single point   Additional Comments: Pt reports same living history as prior admission.      Prior Functioning/Environment Prior Level of Function : Needs assist       Physical Assist : ADLs (physical)   ADLs (physical): IADLs;Bathing;Dressing Mobility Comments: Risk analyst RW, on constant 3-4 LPM Home O2 ADLs Comments: PRN assit for sponge bathing. Min A for dressing. Assist IADL.    OT Problem List: Decreased strength;Decreased range of motion;Decreased activity tolerance;Cardiopulmonary status limiting activity   OT Treatment/Interventions: Self-care/ADL training;Therapeutic exercise;Therapeutic activities;Balance training;Patient/family education      OT Goals(Current goals can be found in the care plan section)   Acute Rehab OT Goals Patient Stated Goal: return home OT Goal Formulation: With patient Time For Goal Achievement: 06/28/23 Potential to Achieve Goals: Good   OT Frequency:  Min 1X/week    Co-evaluation PT/OT/SLP Co-Evaluation/Treatment: Yes Reason for Co-Treatment: To address functional/ADL transfers   OT goals addressed during session: ADL's and self-care                       End of Session Equipment Utilized During Treatment: Rolling walker (2 wheels);Oxygen  Activity Tolerance: Patient tolerated treatment well Patient left: in bed;with call bell/phone within reach  OT Visit Diagnosis: Unsteadiness on feet (R26.81);Muscle weakness (generalized) (M62.81)  Time: 9604-5409 OT Time Calculation (min): 18 min Charges:  OT General Charges $OT Visit: 1 Visit OT Evaluation $OT Eval Low Complexity: 1 Low  Ida Uppal OT, MOT  Danie Chandler 06/14/2023, 9:45 AM

## 2023-06-14 NOTE — TOC Initial Note (Signed)
 Transition of Care [facility]) - Initial/Assessment Note    Patient Details  Name: Denise [name] MRN: [id number] Date of Birth: 10-09-1950  Transition of Care [facility]) CM/SW Contact:    Barron Alvine, RN Phone Number: 06/14/2023, 6:05 PM  Clinical Narrative:                 Pt c/chronic resp failure and hx of lung cancer. Has home O2 ([employer]). Pt states she lives c/her niece and her sister lives next door, so she is rarely alone. At this time pt prefers to dc home s/home health. Instructed pt that her PCP can order [facility] services if she decides after dc that it would be beneficial. Inpatient respiratory consult has been ordered.  Expected Discharge Plan: Home w Home Health Services Barriers to Discharge: Continued Medical Work up   Patient Goals and CMS Choice            Expected Discharge Plan and Services In-house Referral: Clinical Social Work Discharge Planning Services: CM Consult Post Acute Care Choice: Home Health Living arrangements for the past 2 months: Single Family Home                   DME Agency: [employer]                  Prior Living Arrangements/Services Living arrangements for the past 2 months: Single Family Home Lives with:: Relatives Patient language and need for interpreter reviewed:: Yes Do you feel safe going back to the place where you live?: Yes      Need for Family Participation in Patient Care: Yes (Comment) Care giver support system in place?: Yes (comment) Current home services: DME Criminal Activity/Legal Involvement Pertinent to Current Situation/Hospitalization: No - Comment as needed  Activities of Daily Living   ADL Screening (condition at time of admission) Independently performs ADLs?: Yes (appropriate for developmental age) Is the patient deaf or have difficulty hearing?: No Does the patient have difficulty seeing, even when wearing glasses/contacts?: Yes Does the patient have difficulty concentrating, remembering, or making  decisions?: No  Permission Sought/Granted                  Emotional Assessment   Attitude/Demeanor/Rapport: Engaged Affect (typically observed): Appropriate Orientation: : Oriented to Self, Oriented to Place, Oriented to  Time, Oriented to Situation Alcohol / Substance Use: Not Applicable Psych Involvement: No (comment)  Admission diagnosis:  Atypical chest pain [R07.89] Bilateral pleural effusion [J90] Acute on chronic hypoxic respiratory failure (HCC) [J96.21] Patient Active Problem List   Diagnosis Date Noted   Acute on chronic hypoxic respiratory failure (HCC) 06/13/2023   Colitis 01/11/2023   Pneumonia of left lower lobe due to infectious organism 12/28/2022   Acute respiratory failure with hypoxia (HCC) 12/28/2022   Loculated pleural effusion 12/28/2022   Acute on chronic respiratory failure with hypoxia (HCC) 12/24/2022   Pleural effusion, left 12/24/2022   Multifocal pneumonia 12/24/2022   Atrial fibrillation with RVR (HCC) 12/24/2022   Severe sepsis (HCC) 12/24/2022   Iron deficiency anemia 09/02/2022   Early satiety 09/02/2022   Intramuscular hematoma 08/03/2021   Pulmonary embolus (HCC) 07/17/2021   Type 2 diabetes mellitus (HCC)    Essential hypertension    History of lung cancer    Class 3 obesity    Hypokalemia    Malignant neoplasm metastatic to brain (HCC) 06/04/2020   Meningioma, cerebral (HCC) 05/18/2020   Abnormal myocardial perfusion study    Aortic stenosis    DOE (  dyspnea on exertion) 10/09/2019   Meningioma (HCC) 03/20/2019   Port-A-Cath in place 01/06/2019   Adenocarcinoma of left lung (HCC) 12/09/2018   S/P shoulder replacement 01/21/2015   Expected blood loss anemia 03/08/2012   Morbid obesity with BMI of 45.0-49.9, adult (HCC) 03/08/2012   S/P right THA, AA 03/07/2012   Small bowel obstruction (HCC) 04/11/2011   Incisional hernia with obstruction 04/11/2011   Pelvic mass 03/29/2011   Radicular low back pain 11/03/2010   Hip  arthritis 11/03/2010   PCP:  Mirna Mires, MD Pharmacy:   [facility] [phone] - [place],  - [address] AT [facility] [address] [place] [address] Phone: [phone] Fax: [phone]  [facility] [address] [place] [address] Phone: [phone] Fax: [phone]     Social Drivers of Health (SDOH) Social History: SDOH Screenings   Food Insecurity: No Food Insecurity (06/13/2023)  Housing: Low Risk  (06/13/2023)  Transportation Needs: No Transportation Needs (06/13/2023)  Utilities: Patient Declined (06/13/2023)  Financial Resource Strain: Low Risk  (12/06/2018)  Physical Activity: Inactive (12/06/2018)  Social Connections: Moderately Isolated (06/13/2023)  Stress: No Stress Concern Present (12/09/2018)   [facility] from [facility]  Tobacco Use: Medium Risk (06/13/2023)  Health Literacy: Low Risk  (01/01/2022)   [facility] from [facility]   SDOH Interventions:     Readmission Risk Interventions    01/12/2023    3:51 PM 08/09/2021    1:08 PM  Readmission Risk Prevention Plan  Transportation Screening Complete Complete  PCP or Specialist Appt within 3-5 Days Not Complete Complete  HRI or Home Care Consult Complete Complete  Social Work Consult for Recovery Care Planning/Counseling Complete Complete  Palliative Care Screening Not Applicable Not Applicable  Medication Review Oceanographer) Complete Complete

## 2023-06-14 NOTE — Evaluation (Signed)
 Physical Therapy Evaluation Patient Details Name: Denise [name] MRN: [id number] DOB: 1950/10/02 Today's Date: 06/14/2023  History of Present Illness  Denise [name]  is a 73 y.o. female,with history of metastatic/stage IV adenocarcinoma of the lung, chronic toxic respiratory failure on 2 L of oxygen, PE, DM-2, HTN, aortic stenosis, with mets to the left lung status postthoracentesis x 2 in October 2024.  -Presents to ED from cancer center for further evaluation, patient reports dyspnea for couple weeks, she had CT chest abdomen pelvis 06/04/2023, which showed worsening pleural effusion on the right lung (versus left lung in the past), reports dyspnea, was prescribed Medrol pack with no improvement, so felt anemia contributing with hemoglobin of 8.2, so she went for 1 unit PRBC transfusion without much improvement dyspnea, so she was sent to ED for further evaluation.  -In ED her workup significant for hemoglobin at 9.6, BNP at 428, troponins 26 > 24 x-ray showing bilateral pleural effusion, she was desaturating in the mid 80s while on 4 L oxygen so [facility] hospitalist consulted to admit.   Clinical Impression  Patient functioning near baseline for functional mobility and gait demonstrating fair/good return for transfers, ambulating in room, has difficulty moving legs onto bed which is baseline per patient and limited for functional activities mostly due to c/o SOB with SpO2 dropping from 93% to 85% with exertion.  Patient will benefit from continued skilled physical therapy in hospital and recommended venue below to increase strength, balance, endurance for safe ADLs and gait.          If plan is discharge home, recommend the following: A little help with walking and/or transfers;A little help with bathing/dressing/bathroom;Help with stairs or ramp for entrance;Assistance with cooking/housework   Can travel by private vehicle        Equipment Recommendations None recommended by PT  Recommendations for  Other Services       Functional Status Assessment Patient has had a recent decline in their functional status and demonstrates the ability to make significant improvements in function in a reasonable and predictable amount of time.     Precautions / Restrictions Precautions Precautions: Fall Recall of Precautions/Restrictions: Intact Restrictions Weight Bearing Restrictions Per Provider Order: No      Mobility  Bed Mobility Overal bed mobility: Needs Assistance Bed Mobility: Supine to Sit     Supine to sit: Supervision     General bed mobility comments: slightly labored movement, required HOB raised    Transfers Overall transfer level: Needs assistance Equipment used: Rolling walker (2 wheels) Transfers: Sit to/from Stand, Bed to chair/wheelchair/BSC Sit to Stand: Supervision, Contact guard assist   Step pivot transfers: Supervision, Contact guard assist       General transfer comment: increased time, slightly labored movement    Ambulation/Gait Ambulation/Gait assistance: Supervision Gait Distance (Feet): 25 Feet Assistive device: Rolling walker (2 wheels) Gait Pattern/deviations: Decreased step length - right, Decreased step length - left, Decreased stride length Gait velocity: decreased     General Gait Details: slightly labored movement without loss of balance, limited mostly due to SOB and generalized weakness with SpO2 dropping from 93% to 85% on 2 LPM O2  Stairs            Wheelchair Mobility     Tilt Bed    Modified Rankin (Stroke Patients Only)       Balance Overall balance assessment: Needs assistance Sitting-balance support: Feet supported, No upper extremity supported Sitting balance-Leahy Scale: Good Sitting balance - Comments: seated at EOB  Standing balance support: Bilateral upper extremity supported, During functional activity, Reliant on assistive device for balance Standing balance-Leahy Scale: Fair Standing balance  comment: using RW                             Pertinent Vitals/Pain Pain Assessment Pain Assessment: No/denies pain    Home Living Family/patient expects to be discharged to:: Private residence Living Arrangements: Other relatives Available Help at Discharge: Family;Available PRN/intermittently Type of Home: House Home Access: Ramped entrance       Home Layout: One level Home Equipment: Shower seat - built in;Grab bars - tub/shower;Lift chair;Cane - single point Additional Comments: Pt reports same living history as prior admission.    Prior Function Prior Level of Function : Needs assist       Physical Assist : ADLs (physical);Mobility (physical) Mobility (physical): Bed mobility;Transfers;Gait;Stairs ADLs (physical): IADLs;Bathing;Dressing Mobility Comments: Risk analyst RW, on constant 3-4 LPM Home O2 ADLs Comments: PRN assit for sponge bathing. Min A for dressing. Assist IADL.     Extremity/Trunk Assessment   Upper Extremity Assessment Upper Extremity Assessment: Defer to OT evaluation RUE Deficits / Details: Limited by baseline shoulder injury. 2+/5. Generally weak otherwise. LUE Deficits / Details: Generally weak.    Lower Extremity Assessment Lower Extremity Assessment: Generalized weakness    Cervical / Trunk Assessment Cervical / Trunk Assessment: Normal  Communication   Communication Communication: No apparent difficulties    Cognition Arousal: Alert Behavior During Therapy: WFL for tasks assessed/performed   PT - Cognitive impairments: No apparent impairments                         Following commands: Intact       Cueing Cueing Techniques: Verbal cues     General Comments      Exercises     Assessment/Plan    PT Assessment Patient needs continued PT services  PT Problem List Decreased strength;Decreased activity tolerance;Decreased balance;Decreased mobility       PT Treatment Interventions DME  instruction;Gait training;Stair training;Functional mobility training;Therapeutic activities;Therapeutic exercise;Balance training;Patient/family education    PT Goals (Current goals can be found in the Care Plan section)  Acute Rehab PT Goals Patient Stated Goal: return home with family to assist Time For Goal Achievement: 06/22/23 Potential to Achieve Goals: Good    Frequency Min 2X/week     Co-evaluation PT/OT/SLP Co-Evaluation/Treatment: Yes Reason for Co-Treatment: To address functional/ADL transfers PT goals addressed during session: Mobility/safety with mobility;Balance;Proper use of DME OT goals addressed during session: ADL's and self-care       AM-PAC PT "6 Clicks" Mobility  Outcome Measure Help needed turning from your back to your side while in a flat bed without using bedrails?: None Help needed moving from lying on your back to sitting on the side of a flat bed without using bedrails?: A Little Help needed moving to and from a bed to a chair (including a wheelchair)?: A Little Help needed standing up from a chair using your arms (e.g., wheelchair or bedside chair)?: A Little Help needed to walk in hospital room?: A Little Help needed climbing 3-5 steps with a railing? : A Lot 6 Click Score: 18    End of Session Equipment Utilized During Treatment: Oxygen Activity Tolerance: Patient tolerated treatment well;Patient limited by fatigue Patient left: in bed;with call bell/phone within reach Nurse Communication: Mobility status PT Visit Diagnosis: Unsteadiness on feet (R26.81);Other abnormalities of  gait and mobility (R26.89);Muscle weakness (generalized) (M62.81)    Time: 7829-5621 PT Time Calculation (min) (ACUTE ONLY): 10 min   Charges:   PT Evaluation $PT Eval Low Complexity: 1 Low PT Treatments $Therapeutic Activity: 8-22 mins PT General Charges $$ ACUTE PT VISIT: 1 Visit         11:55 AM, 06/14/23 Ocie Bob, MPT Physical Therapist with  [facility] [phone] office [phone] mobile phone

## 2023-06-15 ENCOUNTER — Encounter (HOSPITAL_COMMUNITY): Payer: Self-pay | Admitting: Internal Medicine

## 2023-06-15 ENCOUNTER — Inpatient Hospital Stay (HOSPITAL_COMMUNITY)

## 2023-06-15 DIAGNOSIS — C3492 Malignant neoplasm of unspecified part of left bronchus or lung: Secondary | ICD-10-CM | POA: Diagnosis not present

## 2023-06-15 DIAGNOSIS — J9621 Acute and chronic respiratory failure with hypoxia: Secondary | ICD-10-CM | POA: Diagnosis not present

## 2023-06-15 DIAGNOSIS — I4891 Unspecified atrial fibrillation: Secondary | ICD-10-CM | POA: Diagnosis not present

## 2023-06-15 DIAGNOSIS — I10 Essential (primary) hypertension: Secondary | ICD-10-CM | POA: Diagnosis not present

## 2023-06-15 LAB — PROTEIN, PLEURAL OR PERITONEAL FLUID: Total protein, fluid: 3.2 g/dL

## 2023-06-15 LAB — BODY FLUID CELL COUNT WITH DIFFERENTIAL
Eos, Fluid: 1 %
Lymphs, Fluid: 21 %
Monocyte-Macrophage-Serous Fluid: 72 % (ref 50–90)
Neutrophil Count, Fluid: 6 % (ref 0–25)
Total Nucleated Cell Count, Fluid: 406 uL (ref 0–1000)

## 2023-06-15 LAB — GRAM STAIN: Special Requests: ADEQUATE

## 2023-06-15 LAB — LACTATE DEHYDROGENASE, PLEURAL OR PERITONEAL FLUID: LD, Fluid: 255 U/L — ABNORMAL HIGH (ref 3–23)

## 2023-06-15 MED ORDER — FENTANYL CITRATE PF 50 MCG/ML IJ SOSY
25.0000 ug | PREFILLED_SYRINGE | INTRAMUSCULAR | Status: DC | PRN
  Administered 2023-06-15: 25 ug via INTRAVENOUS
  Administered 2023-06-15 – 2023-06-16 (×2): 50 ug via INTRAVENOUS
  Filled 2023-06-15 (×3): qty 1

## 2023-06-15 MED ORDER — LIDOCAINE HCL (PF) 2 % IJ SOLN
10.0000 mL | Freq: Once | INTRAMUSCULAR | Status: AC
  Administered 2023-06-15: 10 mL
  Filled 2023-06-15: qty 10

## 2023-06-15 MED ORDER — LIDOCAINE HCL (PF) 2 % IJ SOLN
INTRAMUSCULAR | Status: AC
  Filled 2023-06-15: qty 10

## 2023-06-15 NOTE — TOC Transition Note (Signed)
 Transition of Care [facility]) - Discharge Note   Patient Details  Name: Denise [name] MRN: [id number] Date of Birth: May 11, 1950  [facility]) CM/SW Contact:  Isabella Bowens, LCSWA Phone Number: 06/15/2023, 2:50 PM   Clinical Narrative:     Patient is expected to DC today . CSW spoke with patient regarding PT recommendation for HHPT. Pt declined HHPT pt daughter was made aware. Daughter does help patient out and expressed that pt could use the extra help. CSW explained that pt said that she did not want it. TOC signing off.   Final next level of care: Home/Self Care Barriers to Discharge: Barriers Resolved   Patient Goals and CMS Choice Patient states their goals for this hospitalization and ongoing recovery are:: return home CMS Medicare.gov Compare Post Acute Care list provided to:: Patient        Discharge Placement              Patient chooses bed at:  (Patient is DC home) Patient to be transferred to facility by: Family Name of family member notified: [name] - patient Patient and family notified of of transfer: 06/15/23  Discharge Plan and Services Additional resources added to the After Visit Summary for   In-house Referral: Clinical Social Work Discharge Planning Services: CM Consult Post Acute Care Choice: Home Health            DME Agency: NA       HH Arranged: Refused HH HH Agency: NA        Social Drivers of Health (SDOH) Interventions SDOH Screenings   Food Insecurity: No Food Insecurity (06/13/2023)  Housing: Low Risk  (06/13/2023)  Transportation Needs: No Transportation Needs (06/13/2023)  Utilities: Patient Declined (06/13/2023)  Financial Resource Strain: Low Risk  (12/06/2018)  Physical Activity: Inactive (12/06/2018)  Social Connections: Moderately Isolated (06/13/2023)  Stress: No Stress Concern Present (12/09/2018)   Received from [facility]  Tobacco Use: Medium Risk (06/13/2023)  Health Literacy: Low Risk   (01/01/2022)   Received from [facility], [facility]     Readmission Risk Interventions    06/15/2023    2:48 PM 01/12/2023    3:51 PM 08/09/2021    1:08 PM  Readmission Risk Prevention Plan  Transportation Screening Complete Complete Complete  PCP or Specialist Appt within 3-5 Days  Not Complete Complete  HRI or Home Care Consult Complete Complete Complete  Social Work Consult for Recovery Care Planning/Counseling Complete Complete Complete  Palliative Care Screening Not Applicable Not Applicable Not Applicable  Medication Review Oceanographer) Complete Complete Complete

## 2023-06-15 NOTE — Plan of Care (Signed)

## 2023-06-15 NOTE — Progress Notes (Signed)
 PT Cancellation Note  Patient Details Name: Samora Jernberg MRN: [id number] DOB: [birth year]   Cancelled Treatment:    Reason Eval/Treat Not Completed: Patient at procedure or test/unavailable  Attempted to see for PT on this date. Pt going out for Ultrasound. Will attempt on later date.  3:18 PM, 06/15/23 Chryl Heck, PT, DPT [place] with [facility]

## 2023-06-15 NOTE — Progress Notes (Signed)
 Right sided Thoracentesis procedure complete at this time and 550 mL of dark red colored Right pleural fluid removed and sent to lab for processing. Patient transported via stretcher at this time for post chest xray with stable vital signs. Patient verbalized understanding of post procedure instructions.

## 2023-06-15 NOTE — Procedures (Signed)
 PROCEDURE SUMMARY:  Successful US guided right thoracentesis. Yielded 550 ml of blood-tinged fluid. Pt tolerated procedure well. No immediate complications.  Specimen sent for labs. CXR ordered; no post-procedure pneumothorax identified.   EBL < 2 mL  Mickie Kay, NP 06/15/2023 3:39 PM

## 2023-06-15 NOTE — Progress Notes (Signed)
 PROGRESS NOTE   Denise [name]  WUJ:[id number] DOB: Nov 03, 1950 DOA: 06/13/2023 PCP: Mirna Mires, MD   Chief Complaint  Patient presents with   Chest Pain   Level of care: Stepdown  Brief Admission History:  73 y.o. female,with history of metastatic/stage IV adenocarcinoma of the lung, chronic toxic respiratory failure on 2 L of oxygen, PE, DM-2, HTN, aortic stenosis, with mets to the left lung status postthoracentesis x 2 in October 2024.  Presents to ED from cancer center for further evaluation, patient reports dyspnea for couple weeks, she had CT chest abdomen pelvis 06/04/2023, which showed worsening pleural effusion on the right lung (versus left lung in the past), reports dyspnea, was prescribed Medrol pack with no improvement, so felt anemia contributing with hemoglobin of 8.2, so she went for 1 unit PRBC transfusion without much improvement dyspnea, so she was sent to ED for further evaluation.  In ED her workup significant for hemoglobin at 9.6, BNP at 428, troponins 26 > 24 x-ray showing bilateral pleural effusion, she was desaturating in the mid 80s while on 4 L oxygen so [facility] hospitalist consulted to admit.   Assessment and Plan:  Acute on chronic hypoxic respiratory failure Pleural effusion right> left -Pt reports 2-4 L nasal cannula oxygen requirement at baseline, 2L at rest and 4L with ambulation -seems to be at baseline when resting, but severely exacerbated symptoms with minimal activity -pt agreeable to thoracentesis -thoracentesis scheduled for 3/28 per notes   Metastatic adenocarcinoma of the lung to the liver, and right lung -Regiment per oncology, she is currently on chemotherapy -Right pleural effusion, concerning for metastasis   Paroxysmal A-fib with RVR -Continue with Coreg for heart rate control, she had intermittently elevated heart rate, the plan was to give digoxin, but currently converted back to normal sinus rhythm, rate controlled -Continue with full  anticoagulation (rivaroxaban)   History of pulmonary embolism -Continue with rivaroxaban   Hypertension/hypotension -Blood pressure is soft, continue with midodrine, continue with Coreg mainly for heart rate control   Chronic HFpEF -appears compensated, following clinically    Aortic stenosis chronic issue followed by cardiology   Normocytic anemia Secondary to malignancy and chemotherapy, transfused 1 unit PRBC 3/26 at oncology center    Obesity class II Body mass index is 36.94 kg/m.  DVT prophylaxis: rivaroxaban Code Status: Full  Family Communication:  Disposition: anticipate home    Consultants:   Procedures:  US thoracentesis planned 3/28  Antimicrobials:    Subjective: Pt disappointed she wasn't able to have thora done yesterday.  She is eager to get home.  No other complaints.    Objective: Vitals:   06/15/23 0700 06/15/23 0824 06/15/23 1133 06/15/23 1200  BP: (!) 123/59  (!) 100/53 (!) 109/54  Pulse: (!) 116  72 (!) 110  Resp: 14  (!) 22 20  Temp:  98.6 F (37 C)    TempSrc:  Oral    SpO2: 97%  100% 100%  Weight:      Height:       No intake or output data in the 24 hours ending 06/15/23 1213 Filed Weights   06/13/23 1435  Weight: 91.6 kg   Examination:  General exam: Appears calm and comfortable, sitting in chair;   Respiratory system: diminished BS bilateral bases R>L. Cardiovascular system: irregularly irregular at times. No JVD, trace pedal edema. Gastrointestinal system: Abdomen is nondistended, soft and nontender. No organomegaly or masses felt. Normal bowel sounds heard. Central nervous system: Alert and oriented. No focal neurological  deficits. Extremities: Symmetric 5 x 5 power. Skin: No rashes, lesions or ulcers. Psychiatry: Judgement and insight appear normal. Mood & affect appropriate.   Data Reviewed: I have personally reviewed following labs and imaging studies  CBC: Recent Labs  Lab 06/11/23 1300 06/13/23 1514  06/14/23 0433  WBC 10.3 8.6 9.2  NEUTROABS 8.9* 6.4  --   HGB 8.2* 9.6* 9.0*  HCT 29.0* 32.9* 30.4*  MCV 101.0* 99.1 99.7  PLT 597* 607* 509*    Basic Metabolic Panel: Recent Labs  Lab 06/11/23 1300 06/13/23 1514 06/14/23 0433  NA 140 142 143  K 4.1 3.7 3.6  CL 102 101 105  CO2 29 29 30   GLUCOSE 117* 113* 82  BUN 19 16 12   CREATININE 0.91 0.92 0.83  CALCIUM 9.2 9.0 8.5*  MG 2.1  --   --     CBG: No results for input(s): "GLUCAP" in the last 168 hours.  Recent Results (from the past 240 hours)  MRSA Next Gen by PCR, Nasal     Status: Abnormal   Collection Time: 06/13/23  9:51 PM   Specimen: Nasal Mucosa; Nasal Swab  Result Value Ref Range Status   MRSA by PCR Next Gen DETECTED (A) NOT DETECTED Final    Comment: RESULT CALLED TO, READ BACK BY AND VERIFIED WITH: CODY KINDLE, RN AT [phone] 06/14/2023 BY A. SNYDER (NOTE) The GeneXpert MRSA Assay (FDA approved for NASAL specimens only), is one component of a comprehensive MRSA colonization surveillance program. It is not intended to diagnose MRSA infection nor to guide or monitor treatment for MRSA infections. Test performance is not FDA approved in patients less than 20 years old. Performed at [facility], [address], [place], [place] [address]      Radiology Studies: [facility] Chest [facility] 1 View Result Date: 06/13/2023 CLINICAL DATA:  Shortness of breath and chest pain. History of lung cancer. EXAM: PORTABLE CHEST 1 VIEW COMPARISON:  April 16, 2023. FINDINGS: Stable cardiomegaly. Right internal jugular Port-A-Cath is unchanged. Status post left shoulder arthroplasty. Increased bibasilar atelectasis is noted with associated pleural effusions continued left upper lobe airspace opacity is noted concerning for pneumonia. IMPRESSION: Increased bibasilar atelectasis is noted with associated pleural effusions. Stable left upper lobe opacity is noted concerning for pneumonia or atelectasis. Electronically Signed   By: Lupita Raider  M.D.   On: 06/13/2023 16:43    Scheduled Meds:  calcium carbonate  1 tablet Oral BID WC   carvedilol  3.125 mg Oral BID   Chlorhexidine Gluconate Cloth  6 each Topical Daily   Chlorhexidine Gluconate Cloth  6 each Topical Daily   ciprofloxacin  1 drop Both Eyes Q4H while awake   cyanocobalamin  2,500 mcg Oral q morning   ferrous sulfate  325 mg Oral QPM   folic acid  1 mg Oral Daily   gabapentin  600 mg Oral QHS   loteprednol  1 drop Both Eyes BID   magnesium oxide  400 mg Oral BID   midodrine  10 mg Oral TID WC   multivitamin with minerals  1 tablet Oral Daily   mupirocin ointment  1 Application Nasal BID   pantoprazole  40 mg Oral Daily   potassium chloride SA  20 mEq Oral Daily   rivaroxaban  20 mg Oral Q supper   Continuous Infusions:   LOS: 2 days   Time spent: 53 mins  Teruko Joswick Laural Benes, MD How to contact the [facility] Attending or Consulting provider 7A - 7P  or covering provider during after hours 7P -7A, for this patient?  Check the care team in [facility] and look for a) attending/consulting TRH provider listed and b) the [facility] team listed Log into [email or web address] to find provider on call.  Locate the [facility] provider you are looking for under [facility] and page to a number that you can be directly reached. If you still have difficulty reaching the provider, please page the [facility] (Director on Call) for the Hospitalists listed on amion for assistance.  06/15/2023, 12:13 PM

## 2023-06-16 ENCOUNTER — Inpatient Hospital Stay (HOSPITAL_COMMUNITY)

## 2023-06-16 DIAGNOSIS — I4891 Unspecified atrial fibrillation: Secondary | ICD-10-CM | POA: Diagnosis not present

## 2023-06-16 DIAGNOSIS — C3492 Malignant neoplasm of unspecified part of left bronchus or lung: Secondary | ICD-10-CM | POA: Diagnosis not present

## 2023-06-16 DIAGNOSIS — J9621 Acute and chronic respiratory failure with hypoxia: Secondary | ICD-10-CM | POA: Diagnosis not present

## 2023-06-16 MED ORDER — HEPARIN SOD (PORK) LOCK FLUSH 100 UNIT/ML IV SOLN
INTRAVENOUS | Status: AC
Start: 2023-06-16 — End: 2023-06-16
  Administered 2023-06-16: 500 [IU]
  Filled 2023-06-16: qty 5

## 2023-06-16 NOTE — Plan of Care (Signed)

## 2023-06-16 NOTE — Discharge Summary (Signed)
 Physician Discharge Summary  Denise [name] ZOX:[id number] DOB: 1950-03-23 DOA: 06/13/2023  PCP: Mirna Mires, MD Oncologist: Ellin Saba  Admit date: 06/13/2023 Discharge date: 06/16/2023  Admitted From:  Home Disposition: Home   Recommendations for Outpatient Follow-up:  Follow up with PCP in 1 weeks Follow up with Dr. Ellin Saba as scheduled in 2 weeks Follow up with pulmonology clinic in next week for office visit  Discharge Condition: STABLE   CODE STATUS: FULL DIET: heart healthy    Brief Hospitalization Summary: Please see all hospital notes, images, labs for full details of the hospitalization. Admission provider HPI:  73 y.o. female,with history of metastatic/stage IV adenocarcinoma of the lung, chronic toxic respiratory failure on 2 L of oxygen, PE, DM-2, HTN, aortic stenosis, with mets to the left lung status postthoracentesis x 2 in October 2024.  Presents to ED from cancer center for further evaluation, patient reports dyspnea for couple weeks, she had CT chest abdomen pelvis 06/04/2023, which showed worsening pleural effusion on the right lung (versus left lung in the past), reports dyspnea, was prescribed Medrol pack with no improvement, so felt anemia contributing with hemoglobin of 8.2, so she went for 1 unit PRBC transfusion without much improvement dyspnea, so she was sent to ED for further evaluation. -In ED her workup significant for hemoglobin at 9.6, BNP at 428, troponins 26 > 24 x-ray showing bilateral pleural effusion, she was desaturating in the mid 80s while on 4 L oxygen so [facility] consulted to admit.  Hospital Course by listed problem  Acute on chronic hypoxic respiratory failure Pleural effusion right> left -Pt reports 2-4 L nasal cannula oxygen requirement at baseline, 2L at rest and 4L with ambulation -seems to be at baseline when resting, but severely exacerbated symptoms with minimal activity -pt agreeable to thoracentesis -s/p thoracentesis 3/28  where 550 mL of dark red colored right pleural fluid was removed and sent to lab for studies.    Metastatic adenocarcinoma of the lung to the liver, and right lung -Regiment per oncology, she is currently on chemotherapy -Right pleural effusion, concerning for metastasis, follow up fluid studies with Dr. Ellin Saba   Paroxysmal A-fib with RVR -Continue with Coreg for heart rate control, she had intermittently elevated heart rate, the plan was to give digoxin, but currently converted back to normal sinus rhythm, rate controlled -Continue with full anticoagulation (rivaroxaban)   History of pulmonary embolism -Continue with rivaroxaban   Hypertension/hypotension -Blood pressure is soft, continue with midodrine, continue with Coreg mainly for heart rate control   Chronic HFpEF -appears compensated, following clinically    Aortic stenosis chronic issue followed by cardiology   Normocytic anemia Secondary to malignancy and chemotherapy, transfused 1 unit PRBC 3/26 at oncology center    Obesity class II Body mass index is 36.94 kg/m.  Discharge Diagnoses:  Principal Problem:   Acute on chronic hypoxic respiratory failure (HCC) Active Problems:   Adenocarcinoma of left lung (HCC)   Essential hypertension   Class 3 obesity   Atrial fibrillation with RVR [facility])   Discharge Instructions:  Allergies as of 06/16/2023   No Known Allergies      Medication List     STOP taking these medications    methylPREDNISolone 4 MG Tbpk tablet Commonly known as: MEDROL DOSEPAK       TAKE these medications    acetaminophen 500 MG tablet Commonly known as: TYLENOL Take 500 mg by mouth every 6 (six) hours as needed for moderate pain.   albuterol 108 (90 Base)  MCG/ACT inhaler Commonly known as: VENTOLIN HFA Inhale 2 puffs into the lungs every 6 (six) hours as needed for wheezing or shortness of breath.   albuterol (2.5 MG/3ML) 0.083% nebulizer solution Commonly known as:  PROVENTIL Take 3 mLs (2.5 mg total) by nebulization every 6 (six) hours as needed for wheezing or shortness of breath.   carvedilol 3.125 MG tablet Commonly known as: COREG Take 1 tablet (3.125 mg total) by mouth 2 (two) times daily.   ciprofloxacin 0.3 % ophthalmic solution Commonly known as: Ciloxan Place 1 drop into both eyes every 4 (four) hours while awake. Administer 1 drop, every 2 hours, while awake, for 2 days. Then 1 drop, every 4 hours, while awake, for the next 5 days.   DRY EYE RELIEF DROPS OP Apply 1 drop to eye daily as needed (dry eyes).   ferrous sulfate 325 (65 FE) MG tablet Take 325 mg by mouth every evening.   folic acid 1 MG tablet Commonly known as: FOLVITE TAKE 1 TABLET BY MOUTH EVERY DAY   gabapentin 600 MG tablet Commonly known as: NEURONTIN Take 600 mg by mouth at bedtime.   loteprednol 0.5 % ophthalmic suspension Commonly known as: LOTEMAX 1 drop 2 (two) times daily.   magnesium oxide 400 MG tablet Commonly known as: MAG-OX Take 1 tablet by mouth 2 (two) times daily.   midodrine 5 MG tablet Commonly known as: PROAMATINE Take 1 tablet (5 mg total) by mouth 3 (three) times daily with meals.   multivitamin with minerals Tabs tablet Take 1 tablet by mouth daily.   omeprazole 20 MG capsule Commonly known as: PRILOSEC Take 20 mg by mouth daily.   Potassium Chloride ER 20 MEQ Tbcr Take 1 tablet by mouth daily.   rivaroxaban 20 MG Tabs tablet Commonly known as: XARELTO Take 1 tablet (20 mg total) by mouth daily with supper.   TUMS PO Take 2-3 tablets by mouth daily.   Vitamin B-12 2500 MCG Subl Place 2,500 mcg under the tongue every morning.        Follow-up Information     Nyoka Cowden, MD. Call in 1 day.   Specialty: Pulmonary Disease Why: To make an appointment, For follow-up Contact information: [address] [place] [address] [phone]         [facility] Emergency Department at [facility]. Go to .    Specialty: Emergency Medicine Why: As needed, If symptoms worsen Contact information: [address] [place] [address] [phone]        Doreatha Massed, MD. Schedule an appointment as soon as possible for a visit in 2 week(s).   Specialty: Hematology Why: Hospital Follow Up Contact information: [address] [place] [address] [phone]                No Known Allergies Allergies as of 06/16/2023   No Known Allergies      Medication List     STOP taking these medications    methylPREDNISolone 4 MG Tbpk tablet Commonly known as: MEDROL DOSEPAK       TAKE these medications    acetaminophen 500 MG tablet Commonly known as: TYLENOL Take 500 mg by mouth every 6 (six) hours as needed for moderate pain.   albuterol 108 (90 Base) MCG/ACT inhaler Commonly known as: VENTOLIN HFA Inhale 2 puffs into the lungs every 6 (six) hours as needed for wheezing or shortness of breath.   albuterol (2.5 MG/3ML) 0.083% nebulizer solution  Commonly known as: PROVENTIL Take 3 mLs (2.5 mg total) by nebulization every 6 (six) hours as needed for wheezing or shortness of breath.   carvedilol 3.125 MG tablet Commonly known as: COREG Take 1 tablet (3.125 mg total) by mouth 2 (two) times daily.   ciprofloxacin 0.3 % ophthalmic solution Commonly known as: Ciloxan Place 1 drop into both eyes every 4 (four) hours while awake. Administer 1 drop, every 2 hours, while awake, for 2 days. Then 1 drop, every 4 hours, while awake, for the next 5 days.   DRY EYE RELIEF DROPS OP Apply 1 drop to eye daily as needed (dry eyes).   ferrous sulfate 325 (65 FE) MG tablet Take 325 mg by mouth every evening.   folic acid 1 MG tablet Commonly known as: FOLVITE TAKE 1 TABLET BY MOUTH EVERY DAY   gabapentin 600 MG tablet Commonly known as: NEURONTIN Take 600 mg by mouth at bedtime.   loteprednol 0.5 % ophthalmic suspension Commonly known as: LOTEMAX 1 drop 2  (two) times daily.   magnesium oxide 400 MG tablet Commonly known as: MAG-OX Take 1 tablet by mouth 2 (two) times daily.   midodrine 5 MG tablet Commonly known as: PROAMATINE Take 1 tablet (5 mg total) by mouth 3 (three) times daily with meals.   multivitamin with minerals Tabs tablet Take 1 tablet by mouth daily.   omeprazole 20 MG capsule Commonly known as: PRILOSEC Take 20 mg by mouth daily.   Potassium Chloride ER 20 MEQ Tbcr Take 1 tablet by mouth daily.   rivaroxaban 20 MG Tabs tablet Commonly known as: XARELTO Take 1 tablet (20 mg total) by mouth daily with supper.   TUMS PO Take 2-3 tablets by mouth daily.   Vitamin B-12 2500 MCG Subl Place 2,500 mcg under the tongue every morning.        Procedures/Studies: DG CHEST PORT 1 VIEW Result Date: 06/16/2023 CLINICAL DATA:  Chest pain.  Thoracentesis yesterday. EXAM: PORTABLE CHEST 1 VIEW COMPARISON:  Radiographs 06/15/2023 and 06/13/2023. Chest CT 06/04/2023. FINDINGS: 0705 hours. Right IJ Port-A-Cath extends to the mid SVC level. The heart size and mediastinal contours are stable with cardiomegaly and aortic atherosclerosis. Residual small right pleural effusion is unchanged. There is improved aeration of the right lung base. Chronic findings in the left hemithorax related to prior radiation therapy are unchanged. No evidence of pneumothorax. No acute osseous findings are seen. IMPRESSION: 1. Improved aeration of the right lung base following thoracentesis. No evidence of pneumothorax. 2. Stable cardiomegaly and chronic findings in the left hemithorax. Electronically Signed   By: Carey Bullocks M.D.   On: 06/16/2023 10:59   US THORACENTESIS ASP PLEURAL SPACE W/IMG GUIDE Result Date: 06/15/2023 INDICATION: Patient with a history of lung cancer and heart failure presents today with a right pleural effusion. Interventional radiology asked to perform a diagnostic and therapeutic thoracentesis. EXAM: ULTRASOUND GUIDED  THORACENTESIS MEDICATIONS: 1% lidocaine 10 mL COMPLICATIONS: None immediate. PROCEDURE: An ultrasound guided thoracentesis was thoroughly discussed with the patient and questions answered. The benefits, risks, alternatives and complications were also discussed. The patient understands and wishes to proceed with the procedure. Written consent was obtained. Ultrasound was performed to localize and mark an adequate pocket of fluid in the right chest. The area was then prepped and draped in the normal sterile fashion. 1% Lidocaine was used for local anesthesia. Under ultrasound guidance a 6 Fr Safe-T-Centesis catheter was introduced. Thoracentesis was performed. The catheter was removed and a dressing applied.  FINDINGS: A total of approximately 550 mL of blood-tinged fluid was removed. Samples were sent to the laboratory as requested by the clinical team. IMPRESSION: Successful ultrasound guided right thoracentesis yielding 550 mL of pleural fluid. Procedure performed by Alwyn Ren, NP Electronically Signed   By: Marliss Coots M.D.   On: 06/15/2023 16:34   DG Chest 1 View Result Date: 06/15/2023 CLINICAL DATA:  Status post thoracentesis. EXAM: CHEST  1 VIEW COMPARISON:  Chest x-ray 06/13/2023 FINDINGS: Right chest port catheter tip projects over the mid SVC. The heart is enlarged, unchanged. There central pulmonary vascular congestion. There are patchy opacities in the lung bases. Small left pleural effusion is stable. Right pleural effusion has decreased. There is no pneumothorax. Left shoulder arthroplasty again noted. There severe degenerative changes of the right shoulder. IMPRESSION: 1. No pneumothorax. 2. Decreased right pleural effusion. Stable small left pleural effusion. 3. Stable cardiomegaly and central pulmonary vascular congestion. 4. Patchy opacities in the lung bases may represent atelectasis or infection. Electronically Signed   By: Darliss Cheney M.D.   On: 06/15/2023 16:14   DG Chest Port 1  View Result Date: 06/13/2023 CLINICAL DATA:  Shortness of breath and chest pain. History of lung cancer. EXAM: PORTABLE CHEST 1 VIEW COMPARISON:  April 16, 2023. FINDINGS: Stable cardiomegaly. Right internal jugular Port-A-Cath is unchanged. Status post left shoulder arthroplasty. Increased bibasilar atelectasis is noted with associated pleural effusions continued left upper lobe airspace opacity is noted concerning for pneumonia. IMPRESSION: Increased bibasilar atelectasis is noted with associated pleural effusions. Stable left upper lobe opacity is noted concerning for pneumonia or atelectasis. Electronically Signed   By: Lupita Raider M.D.   On: 06/13/2023 16:43   CT CHEST ABDOMEN PELVIS W CONTRAST Result Date: 06/07/2023 CLINICAL DATA:  Monitoring adenocarcinoma of left lung. * Tracking Code: BO * EXAM: CT CHEST, ABDOMEN, AND PELVIS WITH CONTRAST TECHNIQUE: Multidetector CT imaging of the chest, abdomen and pelvis was performed following the standard protocol during bolus administration of intravenous contrast. RADIATION DOSE REDUCTION: This exam was performed according to the departmental dose-optimization program which includes automated exposure control, adjustment of the mA and/or kV according to patient size and/or use of iterative reconstruction technique. CONTRAST:  OMNIPAQUE IOHEXOL 300 MG/ML  SOLN COMPARISON:  CT angiogram 04/19/2023. Standard CT chest 02/20/2023. Abdomen pelvis 01/11/2023. FINDINGS: CT CHEST FINDINGS Cardiovascular: Right IJ chest port. Port is accessed. Tip seen along the SVC right atrial junction. Heart is slightly large. Mild-to-moderate pericardial effusion is again seen. Prominent calcifications along the aortic valve and along the coronary arteries. Mild calcified plaque along the course of the thoracic aorta. Mediastinum/Nodes: Slightly patulous thoracic esophagus. Preserved thyroid gland. No specific abnormal lymph node enlargement identified in the axillary  regions, hilum or mediastinum. Portions of the left upper chest are obscured by the extreme streak artifact from the left hip arthroplasty. Lungs/Pleura: Bilateral pleural effusions are seen, small left and moderate right. The right side has some loculated components as seen previous. Additional lungs demonstrates some scattered ground-glass in a perihilar distribution on the right as well as some bronchial wall thickening and more confluent opacity at the right lower lobe and middle lobe. Consolidative area with air bronchograms in the posterior left upper lobe are also again seen. Some perihilar areas left lower lobe. The amount opacity seen at the left upper lung is appears slightly decreased from previous. No new dominant lung mass. Musculoskeletal: Streak artifact related to the patient's left shoulder arthroplasty. Advanced degenerative changes of  the right shoulder. Scattered degenerative changes along the spine with some curvature. CT ABDOMEN PELVIS FINDINGS Hepatobiliary: Previous cholecystectomy. Patent portal vein. Benign-appearing scattered hepatic cystic foci identified such as left hepatic lobe series 2, image 54, unchanged. Heterogeneous lesions in the right hepatic lobe are again noted. The lesion which previously measured 3.5 x 4.1 cm, today on series 2, image 56 measures 3.9 x 3.7 cm with central area of necrosis. Lesions caudal this appears similar as well but the margins are less well defined. Pancreas: Unremarkable. No pancreatic ductal dilatation or surrounding inflammatory changes. Spleen: Normal in size without focal abnormality. Adrenals/Urinary Tract: Stable thickening of the left adrenal gland. Right adrenal gland is preserved. No enhancing renal mass or collecting system dilatation. Area of focal atrophy along the posterior aspect of the lower pole of the right kidney is stable. The ureters have normal course and caliber extending down to the urinary bladder. Bladder is underdistended.  Stomach/Bowel: Surgical changes of previous gastric bypass. Limit extends superior and anterior to the stomach. Small bowel is nondilated. Large bowel is normal course and caliber with scattered stool and diverticula. Normal appendix in the right lower quadrant. Previous changes of colitis no longer identified. Vascular/Lymphatic: Aortic atherosclerosis. No enlarged abdominal or pelvic lymph nodes. Reproductive: Dystrophic calcification along the uterus may be a calcified fibroid, unchanged from previous. No separate adnexal mass Other: Anasarca. Mesenteric stranding with minimal layering areas of fluid, nonspecific. No free air. Rectus muscle diastasis with protuberance and herniation along the anterior abdominal wall. Musculoskeletal: Advanced degenerative changes along the spine. Multilevel stenosis. Right hip arthroplasty. IMPRESSION: Overall no significant interval change. Persistent bilateral pleural effusions, right-greater-than-left with the adjacent parenchymal opacities including some confluent areas consolidation and some ground-glass. No developing nodal enlargement. Stable pericardial effusion. Stable right hepatic lobe liver lesions. The more caudal smaller focus in the right hepatic lobe is less well defined along its margins than the previous. Colonic diverticulosis.  No obstruction. Electronically Signed   By: Karen Kays M.D.   On: 06/07/2023 10:27     Subjective: Pt says she is feeling a lot better after thoracentesis and the pain is resolved now.  She is eager to go home.  Family will pick her up and she will be with family at home.   Discharge Exam: Vitals:   06/16/23 1103 06/16/23 1129  BP: (!) 114/59   Pulse: 85   Resp: (!) 26   Temp:  98.3 F (36.8 C)  SpO2: 100%    Vitals:   06/16/23 1000 06/16/23 1100 06/16/23 1103 06/16/23 1129  BP: (!) 106/50  (!) 114/59   Pulse: 78  85   Resp: (!) 32 (!) 29 (!) 26   Temp:    98.3 F (36.8 C)  TempSrc:    Oral  SpO2: 99% 97% 100%    Weight:      Height:       General: Pt is alert, awake, not in acute distress Cardiovascular: RRR, S1/S2 +, no rubs, no gallops Respiratory: diminished BS LLL, otherwise clear,  no rhonchi Abdominal: Soft, NT, ND, bowel sounds + Extremities: 1+ edema, no cyanosis   The results of significant diagnostics from this hospitalization (including imaging, microbiology, ancillary and laboratory) are listed below for reference.     Microbiology: Recent Results (from the past 240 hours)  MRSA Next Gen by PCR, Nasal     Status: Abnormal   Collection Time: 06/13/23  9:51 PM   Specimen: Nasal Mucosa; Nasal Swab  Result Value  Ref Range Status   MRSA by PCR Next Gen DETECTED (A) NOT DETECTED Final    Comment: RESULT CALLED TO, READ BACK BY AND VERIFIED WITH: CODY KINDLE, RN AT [phone] 06/14/2023 BY A. SNYDER (NOTE) The GeneXpert MRSA Assay (FDA approved for NASAL specimens only), is one component of a comprehensive MRSA colonization surveillance program. It is not intended to diagnose MRSA infection nor to guide or monitor treatment for MRSA infections. Test performance is not FDA approved in patients less than 62 years old. Performed at [facility], [address], [place], [place] [address]   Culture, body fluid w Gram Stain-bottle     Status: None (Preliminary result)   Collection Time: 06/15/23  3:10 PM   Specimen: Pleura  Result Value Ref Range Status   Specimen Description PLEURAL  Final   Special Requests 10CC BOTTLES DRAWN AEROBIC AND ANAEROBIC  Final   Culture   Final    NO GROWTH < 24 HOURS Performed at [facility], [address], [place], [place] [address]    Report Status PENDING  Incomplete  Gram stain     Status: None   Collection Time: 06/15/23  3:10 PM   Specimen: Pleura  Result Value Ref Range Status   Specimen Description PLEURAL  Final   Special Requests   Final    BOTTLES DRAWN AEROBIC AND ANAEROBIC Blood Culture adequate volume   Gram Stain   Final    WBC  PRESENT, PREDOMINANTLY MONONUCLEAR NO ORGANISMS SEEN CYTOSPIN SMEAR Performed at [facility], [address] [place], [place] [address]    Report Status 06/15/2023 FINAL  Final     Labs: BNP (last 3 results) Recent Labs    12/24/22 1657 04/18/23 2342 06/13/23 1515  BNP 343.0* 502.0* 428.0*   Basic Metabolic Panel: Recent Labs  Lab 06/11/23 1300 06/13/23 1514 06/14/23 0433  NA 140 142 143  K 4.1 3.7 3.6  CL 102 101 105  CO2 29 29 30   GLUCOSE 117* 113* 82  BUN 19 16 12   CREATININE 0.91 0.92 0.83  CALCIUM 9.2 9.0 8.5*  MG 2.1  --   --    Liver Function Tests: Recent Labs  Lab 06/11/23 1300 06/13/23 1514  AST 21 22  ALT 16 15  ALKPHOS 76 73  BILITOT 0.3 0.7  PROT 6.6 6.2*  ALBUMIN 2.6* 2.6*   No results for input(s): "LIPASE", "AMYLASE" in the last 168 hours. No results for input(s): "AMMONIA" in the last 168 hours. CBC: Recent Labs  Lab 06/11/23 1300 06/13/23 1514 06/14/23 0433  WBC 10.3 8.6 9.2  NEUTROABS 8.9* 6.4  --   HGB 8.2* 9.6* 9.0*  HCT 29.0* 32.9* 30.4*  MCV 101.0* 99.1 99.7  PLT 597* 607* 509*   Cardiac Enzymes: No results for input(s): "CKTOTAL", "CKMB", "CKMBINDEX", "TROPONINI" in the last 168 hours. BNP: Invalid input(s): "POCBNP" CBG: No results for input(s): "GLUCAP" in the last 168 hours. D-Dimer No results for input(s): "DDIMER" in the last 72 hours. Hgb A1c No results for input(s): "HGBA1C" in the last 72 hours. Lipid Profile No results for input(s): "CHOL", "HDL", "LDLCALC", "TRIG", "CHOLHDL", "LDLDIRECT" in the last 72 hours. Thyroid function studies No results for input(s): "TSH", "T4TOTAL", "T3FREE", "THYROIDAB" in the last 72 hours.  Invalid input(s): "FREET3" Anemia work up No results for input(s): "VITAMINB12", "FOLATE", "FERRITIN", "TIBC", "IRON", "RETICCTPCT" in the last 72 hours. Urinalysis    Component Value Date/Time   COLORURINE COLORLESS (A) 04/19/2023 0320   APPEARANCEUR CLEAR 04/19/2023 0320  LABSPEC  1.010 04/19/2023 0320   PHURINE 5.0 04/19/2023 0320   GLUCOSEU NEGATIVE 04/19/2023 0320   HGBUR NEGATIVE 04/19/2023 0320   BILIRUBINUR NEGATIVE 04/19/2023 0320   KETONESUR NEGATIVE 04/19/2023 0320   PROTEINUR NEGATIVE 04/19/2023 0320   UROBILINOGEN 1.0 03/05/2012 1200   NITRITE NEGATIVE 04/19/2023 0320   LEUKOCYTESUR NEGATIVE 04/19/2023 0320   Sepsis Labs Recent Labs  Lab 06/11/23 1300 06/13/23 1514 06/14/23 0433  WBC 10.3 8.6 9.2   Microbiology Recent Results (from the past 240 hours)  MRSA Next Gen by PCR, Nasal     Status: Abnormal   Collection Time: 06/13/23  9:51 PM   Specimen: Nasal Mucosa; Nasal Swab  Result Value Ref Range Status   MRSA by PCR Next Gen DETECTED (A) NOT DETECTED Final    Comment: RESULT CALLED TO, READ BACK BY AND VERIFIED WITH: CODY KINDLE, RN AT [phone] 06/14/2023 BY A. SNYDER (NOTE) The GeneXpert MRSA Assay (FDA approved for NASAL specimens only), is one component of a comprehensive MRSA colonization surveillance program. It is not intended to diagnose MRSA infection nor to guide or monitor treatment for MRSA infections. Test performance is not FDA approved in patients less than 93 years old. Performed at [facility], [address], [place], [place] [address]   Culture, body fluid w Gram Stain-bottle     Status: None (Preliminary result)   Collection Time: 06/15/23  3:10 PM   Specimen: Pleura  Result Value Ref Range Status   Specimen Description PLEURAL  Final   Special Requests 10CC BOTTLES DRAWN AEROBIC AND ANAEROBIC  Final   Culture   Final    NO GROWTH < 24 HOURS Performed at [facility], [address] [place], [place] [address]    Report Status PENDING  Incomplete  Gram stain     Status: None   Collection Time: 06/15/23  3:10 PM   Specimen: Pleura  Result Value Ref Range Status   Specimen Description PLEURAL  Final   Special Requests   Final    BOTTLES DRAWN AEROBIC AND ANAEROBIC Blood Culture adequate volume   Gram Stain   Final     WBC PRESENT, PREDOMINANTLY MONONUCLEAR NO ORGANISMS SEEN CYTOSPIN SMEAR Performed at [facility], [address] [place], [place] [address]    Report Status 06/15/2023 FINAL  Final   Time coordinating discharge: 32 mins  SIGNED:  Standley Dakins, MD  [facility] 06/16/2023, 2:12 PM How to contact the [facility] Attending or Consulting provider 7A - 7P or covering provider during after hours 7P -7A, for this patient?  Check the care team in [facility] and look for a) attending/consulting [facility] provider listed and b) the [facility] team listed Log into [email or web address] and use [place]'s universal password to access. If you do not have the password, please contact the hospital operator. Locate the [facility] provider you are looking for under [facility] and page to a number that you can be directly reached. If you still have difficulty reaching the provider, please page the [facility] (Director on Call) for the Hospitalists listed on amion for assistance.

## 2023-06-19 LAB — CYTOLOGY - NON PAP

## 2023-06-20 LAB — CULTURE, BODY FLUID W GRAM STAIN -BOTTLE: Culture: NO GROWTH

## 2023-06-24 ENCOUNTER — Other Ambulatory Visit: Payer: Self-pay

## 2023-06-24 ENCOUNTER — Emergency Department (HOSPITAL_COMMUNITY)

## 2023-06-24 ENCOUNTER — Inpatient Hospital Stay (HOSPITAL_COMMUNITY)
Admission: EM | Admit: 2023-06-24 | Discharge: 2023-06-27 | DRG: 193 | Disposition: A | Discharge status: Home Health | Attending: Internal Medicine | Admitting: Internal Medicine

## 2023-06-24 ENCOUNTER — Encounter (HOSPITAL_COMMUNITY): Payer: Self-pay | Admitting: Emergency Medicine

## 2023-06-24 DIAGNOSIS — Z86711 Personal history of pulmonary embolism: Secondary | ICD-10-CM

## 2023-06-24 DIAGNOSIS — E66812 Obesity, class 2: Secondary | ICD-10-CM | POA: Diagnosis present

## 2023-06-24 DIAGNOSIS — C7931 Secondary malignant neoplasm of brain: Secondary | ICD-10-CM | POA: Diagnosis present

## 2023-06-24 DIAGNOSIS — R0602 Shortness of breath: Principal | ICD-10-CM

## 2023-06-24 DIAGNOSIS — I350 Nonrheumatic aortic (valve) stenosis: Secondary | ICD-10-CM | POA: Diagnosis not present

## 2023-06-24 DIAGNOSIS — I959 Hypotension, unspecified: Secondary | ICD-10-CM

## 2023-06-24 DIAGNOSIS — Z9049 Acquired absence of other specified parts of digestive tract: Secondary | ICD-10-CM

## 2023-06-24 DIAGNOSIS — Z79899 Other long term (current) drug therapy: Secondary | ICD-10-CM

## 2023-06-24 DIAGNOSIS — Z8261 Family history of arthritis: Secondary | ICD-10-CM

## 2023-06-24 DIAGNOSIS — J9620 Acute and chronic respiratory failure, unspecified whether with hypoxia or hypercapnia: Secondary | ICD-10-CM | POA: Diagnosis present

## 2023-06-24 DIAGNOSIS — I2609 Other pulmonary embolism with acute cor pulmonale: Secondary | ICD-10-CM | POA: Diagnosis not present

## 2023-06-24 DIAGNOSIS — Z1152 Encounter for screening for COVID-19: Secondary | ICD-10-CM

## 2023-06-24 DIAGNOSIS — Z9981 Dependence on supplemental oxygen: Secondary | ICD-10-CM

## 2023-06-24 DIAGNOSIS — J9621 Acute and chronic respiratory failure with hypoxia: Secondary | ICD-10-CM | POA: Diagnosis not present

## 2023-06-24 DIAGNOSIS — Z90710 Acquired absence of both cervix and uterus: Secondary | ICD-10-CM

## 2023-06-24 DIAGNOSIS — R000 Tachycardia, unspecified: Secondary | ICD-10-CM | POA: Diagnosis not present

## 2023-06-24 DIAGNOSIS — Z96612 Presence of left artificial shoulder joint: Secondary | ICD-10-CM | POA: Diagnosis present

## 2023-06-24 DIAGNOSIS — I9589 Other hypotension: Secondary | ICD-10-CM | POA: Diagnosis not present

## 2023-06-24 DIAGNOSIS — I10 Essential (primary) hypertension: Secondary | ICD-10-CM | POA: Diagnosis not present

## 2023-06-24 DIAGNOSIS — Z95828 Presence of other vascular implants and grafts: Secondary | ICD-10-CM

## 2023-06-24 DIAGNOSIS — I950 Idiopathic hypotension: Secondary | ICD-10-CM | POA: Diagnosis not present

## 2023-06-24 DIAGNOSIS — Z87891 Personal history of nicotine dependence: Secondary | ICD-10-CM

## 2023-06-24 DIAGNOSIS — I480 Paroxysmal atrial fibrillation: Secondary | ICD-10-CM | POA: Diagnosis present

## 2023-06-24 DIAGNOSIS — E119 Type 2 diabetes mellitus without complications: Secondary | ICD-10-CM | POA: Diagnosis not present

## 2023-06-24 DIAGNOSIS — Z96649 Presence of unspecified artificial hip joint: Secondary | ICD-10-CM | POA: Diagnosis present

## 2023-06-24 DIAGNOSIS — D509 Iron deficiency anemia, unspecified: Secondary | ICD-10-CM | POA: Diagnosis present

## 2023-06-24 DIAGNOSIS — R918 Other nonspecific abnormal finding of lung field: Secondary | ICD-10-CM | POA: Diagnosis not present

## 2023-06-24 DIAGNOSIS — R262 Difficulty in walking, not elsewhere classified: Secondary | ICD-10-CM | POA: Diagnosis present

## 2023-06-24 DIAGNOSIS — J9611 Chronic respiratory failure with hypoxia: Secondary | ICD-10-CM | POA: Diagnosis present

## 2023-06-24 DIAGNOSIS — Z808 Family history of malignant neoplasm of other organs or systems: Secondary | ICD-10-CM

## 2023-06-24 DIAGNOSIS — Z8249 Family history of ischemic heart disease and other diseases of the circulatory system: Secondary | ICD-10-CM

## 2023-06-24 DIAGNOSIS — Z9884 Bariatric surgery status: Secondary | ICD-10-CM

## 2023-06-24 DIAGNOSIS — Z9221 Personal history of antineoplastic chemotherapy: Secondary | ICD-10-CM

## 2023-06-24 DIAGNOSIS — Z7901 Long term (current) use of anticoagulants: Secondary | ICD-10-CM

## 2023-06-24 DIAGNOSIS — Z825 Family history of asthma and other chronic lower respiratory diseases: Secondary | ICD-10-CM

## 2023-06-24 DIAGNOSIS — I2510 Atherosclerotic heart disease of native coronary artery without angina pectoris: Secondary | ICD-10-CM | POA: Diagnosis present

## 2023-06-24 DIAGNOSIS — J189 Pneumonia, unspecified organism: Secondary | ICD-10-CM | POA: Diagnosis present

## 2023-06-24 DIAGNOSIS — Z923 Personal history of irradiation: Secondary | ICD-10-CM

## 2023-06-24 DIAGNOSIS — Z471 Aftercare following joint replacement surgery: Secondary | ICD-10-CM | POA: Diagnosis not present

## 2023-06-24 DIAGNOSIS — Z833 Family history of diabetes mellitus: Secondary | ICD-10-CM

## 2023-06-24 DIAGNOSIS — I2699 Other pulmonary embolism without acute cor pulmonale: Secondary | ICD-10-CM | POA: Diagnosis present

## 2023-06-24 DIAGNOSIS — Z6835 Body mass index (BMI) 35.0-35.9, adult: Secondary | ICD-10-CM | POA: Diagnosis not present

## 2023-06-24 DIAGNOSIS — J90 Pleural effusion, not elsewhere classified: Secondary | ICD-10-CM | POA: Diagnosis not present

## 2023-06-24 DIAGNOSIS — C3492 Malignant neoplasm of unspecified part of left bronchus or lung: Secondary | ICD-10-CM | POA: Diagnosis present

## 2023-06-24 DIAGNOSIS — C3490 Malignant neoplasm of unspecified part of unspecified bronchus or lung: Secondary | ICD-10-CM | POA: Diagnosis not present

## 2023-06-24 DIAGNOSIS — I4891 Unspecified atrial fibrillation: Secondary | ICD-10-CM | POA: Diagnosis not present

## 2023-06-24 DIAGNOSIS — J984 Other disorders of lung: Secondary | ICD-10-CM | POA: Diagnosis not present

## 2023-06-24 DIAGNOSIS — Z803 Family history of malignant neoplasm of breast: Secondary | ICD-10-CM

## 2023-06-24 DIAGNOSIS — R0600 Dyspnea, unspecified: Secondary | ICD-10-CM | POA: Diagnosis not present

## 2023-06-24 DIAGNOSIS — F410 Panic disorder [episodic paroxysmal anxiety] without agoraphobia: Secondary | ICD-10-CM | POA: Diagnosis present

## 2023-06-24 LAB — COMPREHENSIVE METABOLIC PANEL WITH GFR
ALT: 11 U/L (ref 0–44)
AST: 15 U/L (ref 15–41)
Albumin: 2.4 g/dL — ABNORMAL LOW (ref 3.5–5.0)
Alkaline Phosphatase: 71 U/L (ref 38–126)
Anion gap: 12 (ref 5–15)
BUN: 13 mg/dL (ref 8–23)
CO2: 34 mmol/L — ABNORMAL HIGH (ref 22–32)
Calcium: 9 mg/dL (ref 8.9–10.3)
Chloride: 94 mmol/L — ABNORMAL LOW (ref 98–111)
Creatinine, Ser: 0.74 mg/dL (ref 0.44–1.00)
GFR, Estimated: >60 mL/min (ref >60)
Glucose, Bld: 99 mg/dL (ref 70–99)
Potassium: 3.6 mmol/L (ref 3.5–5.1)
Sodium: 140 mmol/L (ref 135–145)
Total Bilirubin: 0.5 mg/dL (ref 0.0–1.2)
Total Protein: 5.9 g/dL — ABNORMAL LOW (ref 6.5–8.1)

## 2023-06-24 LAB — CBC
HCT: 30.4 % — ABNORMAL LOW (ref 36.0–46.0)
Hemoglobin: 8.7 g/dL — ABNORMAL LOW (ref 12.0–15.0)
MCH: 29.1 pg (ref 26.0–34.0)
MCHC: 28.6 g/dL — ABNORMAL LOW (ref 30.0–36.0)
MCV: 101.7 fL — ABNORMAL HIGH (ref 80.0–100.0)
Platelets: 350 10*3/uL (ref 150–400)
RBC: 2.99 MIL/uL — ABNORMAL LOW (ref 3.87–5.11)
RDW: 16.6 % — ABNORMAL HIGH (ref 11.5–15.5)
WBC: 9 10*3/uL (ref 4.0–10.5)
nRBC: 0 % (ref 0.0–0.2)

## 2023-06-24 LAB — BRAIN NATRIURETIC PEPTIDE: B Natriuretic Peptide: 406 pg/mL — ABNORMAL HIGH (ref 0.0–100.0)

## 2023-06-24 LAB — RESP PANEL BY RT-PCR (RSV, FLU A&B, COVID)  RVPGX2
Influenza A by PCR: NEGATIVE
Influenza B by PCR: NEGATIVE
Resp Syncytial Virus by PCR: NEGATIVE
SARS Coronavirus 2 by RT PCR: NEGATIVE

## 2023-06-24 LAB — TROPONIN I (HIGH SENSITIVITY): Troponin I (High Sensitivity): 19 ng/L — ABNORMAL HIGH (ref <18)

## 2023-06-24 MED ORDER — ONDANSETRON HCL 4 MG PO TABS: 4.0000 mg | ORAL_TABLET | Freq: Four times a day (QID) | ORAL | Status: DC | PRN

## 2023-06-24 MED ORDER — ALBUTEROL SULFATE (2.5 MG/3ML) 0.083% IN NEBU
2.5000 mg | INHALATION_SOLUTION | Freq: Four times a day (QID) | RESPIRATORY_TRACT | Status: DC | PRN
Start: 2023-06-24 — End: 2023-06-27

## 2023-06-24 MED ORDER — SODIUM CHLORIDE 0.9 % IV SOLN
500.0000 mg | Freq: Once | INTRAVENOUS | Status: AC
  Administered 2023-06-24: 500 mg via INTRAVENOUS
  Filled 2023-06-24: qty 5

## 2023-06-24 MED ORDER — IOHEXOL 300 MG/ML  SOLN
75.0000 mL | Freq: Once | INTRAMUSCULAR | Status: AC | PRN
  Administered 2023-06-24: 75 mL via INTRAVENOUS

## 2023-06-24 MED ORDER — RIVAROXABAN 20 MG PO TABS
20.0000 mg | ORAL_TABLET | Freq: Every day | ORAL | Status: DC
  Administered 2023-06-24: 20 mg via ORAL
  Filled 2023-06-24: qty 1

## 2023-06-24 MED ORDER — ONDANSETRON HCL 4 MG/2ML IJ SOLN: 4.0000 mg | Freq: Four times a day (QID) | INTRAMUSCULAR | Status: DC | PRN

## 2023-06-24 MED ORDER — DILTIAZEM LOAD VIA INFUSION
10.0000 mg | Freq: Once | INTRAVENOUS | Status: AC
  Administered 2023-06-24: 10 mg via INTRAVENOUS
  Filled 2023-06-24: qty 10

## 2023-06-24 MED ORDER — MIDODRINE HCL 5 MG PO TABS
5.0000 mg | ORAL_TABLET | Freq: Three times a day (TID) | ORAL | Status: DC
  Administered 2023-06-24 – 2023-06-25 (×3): 5 mg via ORAL
  Filled 2023-06-24 (×3): qty 1

## 2023-06-24 MED ORDER — LACTATED RINGERS IV BOLUS
500.0000 mL | Freq: Once | INTRAVENOUS | Status: AC
  Administered 2023-06-24: 500 mL via INTRAVENOUS

## 2023-06-24 MED ORDER — ACETAMINOPHEN 650 MG RE SUPP: 650.0000 mg | Freq: Four times a day (QID) | RECTAL | Status: DC | PRN

## 2023-06-24 MED ORDER — CHLORHEXIDINE GLUCONATE CLOTH 2 % EX PADS
6.0000 | MEDICATED_PAD | Freq: Every day | CUTANEOUS | Status: DC
  Administered 2023-06-25 – 2023-06-27 (×3): 6 via TOPICAL

## 2023-06-24 MED ORDER — IPRATROPIUM BROMIDE 0.02 % IN SOLN
0.5000 mg | Freq: Once | RESPIRATORY_TRACT | Status: AC
  Administered 2023-06-24: 0.5 mg via RESPIRATORY_TRACT
  Filled 2023-06-24: qty 2.5

## 2023-06-24 MED ORDER — SODIUM CHLORIDE 0.9% FLUSH
10.0000 mL | Freq: Two times a day (BID) | INTRAVENOUS | Status: DC
  Administered 2023-06-24 – 2023-06-27 (×5): 10 mL

## 2023-06-24 MED ORDER — DILTIAZEM HCL-DEXTROSE 125-5 MG/125ML-% IV SOLN (PREMIX)
5.0000 mg/h | INTRAVENOUS | Status: DC
  Administered 2023-06-24: 5 mg/h via INTRAVENOUS
  Administered 2023-06-25: 7.5 mg/h via INTRAVENOUS
  Filled 2023-06-24 (×4): qty 125

## 2023-06-24 MED ORDER — ACETAMINOPHEN 325 MG PO TABS: 650.0000 mg | ORAL_TABLET | Freq: Four times a day (QID) | ORAL | Status: DC | PRN

## 2023-06-24 MED ORDER — CARVEDILOL 3.125 MG PO TABS
3.1250 mg | ORAL_TABLET | Freq: Two times a day (BID) | ORAL | Status: DC
  Filled 2023-06-24: qty 1

## 2023-06-24 MED ORDER — POLYETHYLENE GLYCOL 3350 17 G PO PACK: 17.0000 g | PACK | Freq: Every day | ORAL | Status: DC | PRN

## 2023-06-24 MED ORDER — SODIUM CHLORIDE 0.9% FLUSH: 10.0000 mL | INTRAVENOUS | Status: DC | PRN

## 2023-06-24 MED ORDER — ALBUTEROL SULFATE (2.5 MG/3ML) 0.083% IN NEBU
5.0000 mg | INHALATION_SOLUTION | Freq: Once | RESPIRATORY_TRACT | Status: AC
  Administered 2023-06-24: 5 mg via RESPIRATORY_TRACT
  Filled 2023-06-24: qty 6

## 2023-06-24 MED ORDER — SODIUM CHLORIDE 0.9 % IV SOLN
1.0000 g | Freq: Once | INTRAVENOUS | Status: AC
  Administered 2023-06-24: 1 g via INTRAVENOUS
  Filled 2023-06-24: qty 10

## 2023-06-24 NOTE — Assessment & Plan Note (Signed)
Resume Xarelto 

## 2023-06-24 NOTE — Assessment & Plan Note (Signed)
 Last echo 12/2022-aortic valve appear significantly stenotic.  Per cardiology notes 02/2023, patient not felt to be/may not to be a candidate for TAVR given metastatic lung cancer.

## 2023-06-24 NOTE — ED Notes (Addendum)
 RN went in to assess pt, pt HR found to be 140-160s, o2 @85 % on 4LNC. EKG captured, Dr. Denton Lank made aware.  Pt o2 increased to [facility].

## 2023-06-24 NOTE — Assessment & Plan Note (Addendum)
 Was hypotensive down to 89/58, was given 500 mL bolus with improvement in blood pressure. -Continue Cardizem drip, wean off as able - Hold carvedilol for now -Resume midodrine

## 2023-06-24 NOTE — ED Provider Notes (Signed)
 [facility] AT [facility] Provider Note   CSN: [id number] Arrival date & time: 06/24/23  1537     History  Chief Complaint  Patient presents with   Shortness of Breath    Denise [name] is a 73 y.o. female.  Pt with hx lung ca, pleural effusions, on anticoag therapy, one home o2, presents with increased sob today. Denies chest pain or discomfort. Occasional non prod cough. No sore throat or runny nose. No fever or chills. Compliant w home meds. No new leg pain/swelling. Has albuterol inhaler uses rarely prn. Uses home o2 2 liters all the time and 4 liters with any activity.   The history is provided by the patient and medical records. The history is limited by the condition of the patient.  Shortness of Breath Associated symptoms: no abdominal pain, no chest pain, no fever, no headaches, no neck pain, no rash, no sore throat and no vomiting        Home Medications Prior to Admission medications   Medication Sig Start Date End Date Taking? Authorizing Provider  acetaminophen (TYLENOL) 500 MG tablet Take 500 mg by mouth every 6 (six) hours as needed for moderate pain.   Yes [provider]  albuterol (PROVENTIL) (2.5 MG/3ML) 0.083% nebulizer solution Take 3 mLs (2.5 mg total) by nebulization every 6 (six) hours as needed for wheezing or shortness of breath. 06/07/23  Yes Doreatha Massed, MD  albuterol (VENTOLIN HFA) 108 (90 Base) MCG/ACT inhaler Inhale 2 puffs into the lungs every 6 (six) hours as needed for wheezing or shortness of breath. 01/02/23  Yes Ghimire, Werner Lean, MD  Calcium Carbonate Antacid (TUMS PO) Take 2-3 tablets by mouth daily.   Yes [provider]  carvedilol (COREG) 3.125 MG tablet Take 1 tablet (3.125 mg total) by mouth 2 (two) times daily. 02/27/23  Yes Strader, [place] M, PA-C  ciprofloxacin (CILOXAN) 0.3 % ophthalmic solution Place 1 drop into both eyes every 4 (four) hours while awake. Administer 1 drop, every  2 hours, while awake, for 2 days. Then 1 drop, every 4 hours, while awake, for the next 5 days. 04/30/23  Yes Doreatha Massed, MD  Cyanocobalamin (VITAMIN B-12) 2500 MCG SUBL Place 2,500 mcg under the tongue every morning.    Yes [provider]  ferrous sulfate 325 (65 FE) MG tablet Take 325 mg by mouth every evening.   Yes [provider]  folic acid (FOLVITE) 1 MG tablet TAKE 1 TABLET BY MOUTH EVERY DAY 03/19/23  Yes Doreatha Massed, MD  gabapentin (NEURONTIN) 600 MG tablet Take 600 mg by mouth at bedtime. 11/22/22  Yes [provider]  Glycerin-Hypromellose-PEG 400 (DRY EYE RELIEF DROPS OP) Apply 1 drop to eye daily as needed (dry eyes).   Yes [provider]  loteprednol (LOTEMAX) 0.5 % ophthalmic suspension Place 1 drop into both eyes 2 (two) times daily. 05/07/23  Yes [provider]  magnesium oxide (MAG-OX) 400 MG tablet Take 1 tablet by mouth 2 (two) times daily. 05/22/23  Yes [provider]  midodrine (PROAMATINE) 5 MG tablet Take 1 tablet (5 mg total) by mouth 3 (three) times daily with meals. 02/27/23  Yes Strader, [place] M, PA-C  Multiple Vitamin (MULITIVITAMIN WITH MINERALS) TABS Take 1 tablet by mouth daily.   Yes [provider]  omeprazole (PRILOSEC) 20 MG capsule Take 20 mg by mouth daily. 11/18/22  Yes [provider]  Potassium Chloride ER 20 MEQ TBCR Take 1 tablet by  mouth daily. 01/01/23  Yes [provider]  rivaroxaban (XARELTO) 20 MG TABS tablet Take 1 tablet (20 mg total) by mouth daily with supper. 04/30/23  Yes Doreatha Massed, MD      Allergies    Patient has no known allergies.    Review of Systems   Review of Systems  Constitutional:  Negative for chills and fever.  HENT:  Negative for sore throat.   Respiratory:  Positive for shortness of breath.   Cardiovascular:  Negative for chest pain and palpitations.  Gastrointestinal:  Negative for abdominal pain, diarrhea and  vomiting.  Genitourinary:  Negative for dysuria and flank pain.  Musculoskeletal:  Negative for back pain and neck pain.  Skin:  Negative for rash.  Neurological:  Negative for headaches.    Physical Exam Updated Vital Signs BP 115/75   Pulse (!) 132   Temp 98.9 F (37.2 C) (Oral)   Resp (!) 32   Ht 1.575 m (5\' 2" )   Wt 92 kg   SpO2 (!) 88%   BMI 37.10 kg/m  Physical Exam Vitals and nursing note reviewed.  Constitutional:      Appearance: Normal appearance. She is well-developed.  HENT:     Head: Atraumatic.     Nose: Nose normal.     Mouth/Throat:     Mouth: Mucous membranes are moist.  Eyes:     General: No scleral icterus.    Conjunctiva/sclera: Conjunctivae normal.     Pupils: Pupils are equal, round, and reactive to light.  Neck:     Trachea: No tracheal deviation.  Cardiovascular:     Rate and Rhythm: Regular rhythm. Tachycardia present.     Pulses: Normal pulses.     Heart sounds: Normal heart sounds. No murmur heard.    No friction rub. No gallop.  Pulmonary:     Effort: Respiratory distress present.     Breath sounds: Rhonchi present.     Comments: Port right chest without sign of infection. Diminished bs bil. ?faint wheeze.  Abdominal:     General: Bowel sounds are normal. There is no distension.     Palpations: Abdomen is soft.     Tenderness: There is no abdominal tenderness. There is no guarding.  Genitourinary:    Comments: No cva tenderness.  Musculoskeletal:     Cervical back: Normal range of motion and neck supple. No rigidity. No muscular tenderness.     Comments: Mild-mod symmetric bil foot, ankle and lower leg edema.   Skin:    General: Skin is warm and dry.     Findings: No rash.  Neurological:     Mental Status: She is alert.     Comments: Alert, speech normal. Motor/sens grossly intact bil  Psychiatric:        Mood and Affect: Mood normal.     ED Results / Procedures / Treatments   Labs (all labs ordered are listed, but only  abnormal results are displayed) Results for orders placed or performed during the hospital encounter of 06/24/23  Resp panel by RT-PCR (RSV, Flu A&B, Covid) Anterior Nasal Swab   Collection Time: 06/24/23  4:04 PM   Specimen: Anterior Nasal Swab  Result Value Ref Range   SARS Coronavirus 2 by RT PCR NEGATIVE NEGATIVE   Influenza A by PCR NEGATIVE NEGATIVE   Influenza B by PCR NEGATIVE NEGATIVE   Resp Syncytial Virus by PCR NEGATIVE NEGATIVE  CBC   Collection Time: 06/24/23  4:04 PM  Result Value Ref  Range   WBC 9.0 4.0 - 10.5 K/uL   RBC 2.99 (L) 3.87 - 5.11 MIL/uL   Hemoglobin 8.7 (L) 12.0 - 15.0 g/dL   HCT 30.8 (L) 65.7 - 84.6 %   MCV 101.7 (H) 80.0 - 100.0 fL   MCH 29.1 26.0 - 34.0 pg   MCHC 28.6 (L) 30.0 - 36.0 g/dL   RDW 96.2 (H) 95.2 - 84.1 %   Platelets 350 150 - 400 K/uL   nRBC 0.0 0.0 - 0.2 %  Comprehensive metabolic panel with GFR   Collection Time: 06/24/23  4:04 PM  Result Value Ref Range   Sodium 140 135 - 145 mmol/L   Potassium 3.6 3.5 - 5.1 mmol/L   Chloride 94 (L) 98 - 111 mmol/L   CO2 34 (H) 22 - 32 mmol/L   Glucose, Bld 99 70 - 99 mg/dL   BUN 13 8 - 23 mg/dL   Creatinine, Ser 3.24 0.44 - 1.00 mg/dL   Calcium 9.0 8.9 - 40.1 mg/dL   Total Protein 5.9 (L) 6.5 - 8.1 g/dL   Albumin 2.4 (L) 3.5 - 5.0 g/dL   AST 15 15 - 41 U/L   ALT 11 0 - 44 U/L   Alkaline Phosphatase 71 38 - 126 U/L   Total Bilirubin 0.5 0.0 - 1.2 mg/dL   GFR, Estimated >02 >72 mL/min   Anion gap 12 5 - 15  Troponin I (High Sensitivity)   Collection Time: 06/24/23  4:04 PM  Result Value Ref Range   Troponin I (High Sensitivity) 19 (H) <18 ng/L  Brain natriuretic peptide   Collection Time: 06/24/23  4:19 PM  Result Value Ref Range   B Natriuretic Peptide 406.0 (H) 0.0 - 100.0 pg/mL   CT Chest W Contrast Result Date: 06/24/2023 CLINICAL DATA:  History of lung cancer increased short of breath EXAM: CT CHEST WITH CONTRAST TECHNIQUE: Multidetector CT imaging of the chest was performed during  intravenous contrast administration. RADIATION DOSE REDUCTION: This exam was performed according to the departmental dose-optimization program which includes automated exposure control, adjustment of the mA and/or kV according to patient size and/or use of iterative reconstruction technique. CONTRAST:  75mL OMNIPAQUE IOHEXOL 300 MG/ML  SOLN COMPARISON:  Chest x-ray 06/24/2023, CT 06/04/2023, 04/19/2023, 07/17/2021, 03/02/2020, PET CT 12/17/2018 FINDINGS: Cardiovascular: Right-sided central venous port with tip at the distal SVC. Moderate aortic atherosclerosis. No aneurysm or dissection. Cardiomegaly. Small pericardial effusion. Aortic valvular calcification. Mediastinum/Nodes: Patent trachea. No thyroid mass. Subcentimeter mediastinal lymph nodes. Esophagus within normal limits. Lungs/Pleura: Small loculated left pleural effusion without substantial change compared to most recent prior exam from March. Slight interval increase in loculated right pleural effusion compared to most recent prior. Stable post therapeutic distortion and consolidation in the left upper lobe. Otherwise similar pattern of consolidations at the right middle and bilateral lower lobes compared to most recent prior. Similar pattern of perihilar ground-glass density. Upper Abdomen: Hepatic metastatic lesions at the right hepatic lobe with index lesion measuring about 35 x 40 mm, previously 39 x 37 mm. No acute finding. Postsurgical changes of the stomach consistent with prior gastric bypass surgery Musculoskeletal: Generalized subcutaneous edema. No acute or suspicious osseous abnormality. IMPRESSION: 1. Slight interval increase in loculated right pleural effusion compared to most recent prior exam from March. Stable small loculated left pleural effusion. 2. Stable post therapeutic distortion and consolidation in the left upper lobe. Otherwise similar pattern of consolidations at the right middle and bilateral lower lobes compared to most recent  prior. Similar pattern of perihilar ground-glass density. 3. Cardiomegaly with small pericardial effusion. 4. Hepatic metastatic lesions, similar to prior. 5. Generalized subcutaneous edema. 6. Aortic atherosclerosis. Aortic Atherosclerosis (ICD10-I70.0). Electronically Signed   By: Jasmine Pang M.D.   On: 06/24/2023 19:05   DG Chest Port 1 View Result Date: 06/24/2023 CLINICAL DATA:  Increasing shortness of breath. History of lung cancer. EXAM: PORTABLE CHEST 1 VIEW COMPARISON:  Radiograph 06/16/2023, chest CT 06/04/2023 FINDINGS: Right chest port in place. Partially loculated right pleural effusion has increased from prior exam. Left pleural effusion is similar. Peripheral left upper lobe opacity without interval change. No pneumothorax or pulmonary edema. Stable heart size and mediastinal contours. Left shoulder arthroplasty. Advanced right shoulder arthropathy. IMPRESSION: 1. Partially loculated right pleural effusion has increased from prior exam. 2. Left pleural effusion is similar. 3. Peripheral left upper lobe opacity without interval change. Electronically Signed   By: Narda Rutherford M.D.   On: 06/24/2023 16:28   DG CHEST PORT 1 VIEW Result Date: 06/16/2023 CLINICAL DATA:  Chest pain.  Thoracentesis yesterday. EXAM: PORTABLE CHEST 1 VIEW COMPARISON:  Radiographs 06/15/2023 and 06/13/2023. Chest CT 06/04/2023. FINDINGS: 0705 hours. Right IJ Port-A-Cath extends to the mid SVC level. The heart size and mediastinal contours are stable with cardiomegaly and aortic atherosclerosis. Residual small right pleural effusion is unchanged. There is improved aeration of the right lung base. Chronic findings in the left hemithorax related to prior radiation therapy are unchanged. No evidence of pneumothorax. No acute osseous findings are seen. IMPRESSION: 1. Improved aeration of the right lung base following thoracentesis. No evidence of pneumothorax. 2. Stable cardiomegaly and chronic findings in the left  hemithorax. Electronically Signed   By: Carey Bullocks M.D.   On: 06/16/2023 10:59   US THORACENTESIS ASP PLEURAL SPACE W/IMG GUIDE Result Date: 06/15/2023 INDICATION: Patient with a history of lung cancer and heart failure presents today with a right pleural effusion. Interventional radiology asked to perform a diagnostic and therapeutic thoracentesis. EXAM: ULTRASOUND GUIDED THORACENTESIS MEDICATIONS: 1% lidocaine 10 mL COMPLICATIONS: None immediate. PROCEDURE: An ultrasound guided thoracentesis was thoroughly discussed with the patient and questions answered. The benefits, risks, alternatives and complications were also discussed. The patient understands and wishes to proceed with the procedure. Written consent was obtained. Ultrasound was performed to localize and mark an adequate pocket of fluid in the right chest. The area was then prepped and draped in the normal sterile fashion. 1% Lidocaine was used for local anesthesia. Under ultrasound guidance a 6 Fr Safe-T-Centesis catheter was introduced. Thoracentesis was performed. The catheter was removed and a dressing applied. FINDINGS: A total of approximately 550 mL of blood-tinged fluid was removed. Samples were sent to the laboratory as requested by the clinical team. IMPRESSION: Successful ultrasound guided right thoracentesis yielding 550 mL of pleural fluid. Procedure performed by Alwyn Ren, NP Electronically Signed   By: Marliss Coots M.D.   On: 06/15/2023 16:34   DG Chest 1 View Result Date: 06/15/2023 CLINICAL DATA:  Status post thoracentesis. EXAM: CHEST  1 VIEW COMPARISON:  Chest x-ray 06/13/2023 FINDINGS: Right chest port catheter tip projects over the mid SVC. The heart is enlarged, unchanged. There central pulmonary vascular congestion. There are patchy opacities in the lung bases. Small left pleural effusion is stable. Right pleural effusion has decreased. There is no pneumothorax. Left shoulder arthroplasty again noted. There severe  degenerative changes of the right shoulder. IMPRESSION: 1. No pneumothorax. 2. Decreased right pleural effusion. Stable small left pleural effusion. 3.  Stable cardiomegaly and central pulmonary vascular congestion. 4. Patchy opacities in the lung bases may represent atelectasis or infection. Electronically Signed   By: Darliss Cheney M.D.   On: 06/15/2023 16:14   DG Chest Port 1 View Result Date: 06/13/2023 CLINICAL DATA:  Shortness of breath and chest pain. History of lung cancer. EXAM: PORTABLE CHEST 1 VIEW COMPARISON:  April 16, 2023. FINDINGS: Stable cardiomegaly. Right internal jugular Port-A-Cath is unchanged. Status post left shoulder arthroplasty. Increased bibasilar atelectasis is noted with associated pleural effusions continued left upper lobe airspace opacity is noted concerning for pneumonia. IMPRESSION: Increased bibasilar atelectasis is noted with associated pleural effusions. Stable left upper lobe opacity is noted concerning for pneumonia or atelectasis. Electronically Signed   By: Lupita Raider M.D.   On: 06/13/2023 16:43   CT CHEST ABDOMEN PELVIS W CONTRAST Result Date: 06/07/2023 CLINICAL DATA:  Monitoring adenocarcinoma of left lung. * Tracking Code: BO * EXAM: CT CHEST, ABDOMEN, AND PELVIS WITH CONTRAST TECHNIQUE: Multidetector CT imaging of the chest, abdomen and pelvis was performed following the standard protocol during bolus administration of intravenous contrast. RADIATION DOSE REDUCTION: This exam was performed according to the departmental dose-optimization program which includes automated exposure control, adjustment of the mA and/or kV according to patient size and/or use of iterative reconstruction technique. CONTRAST:  OMNIPAQUE IOHEXOL 300 MG/ML  SOLN COMPARISON:  CT angiogram 04/19/2023. Standard CT chest 02/20/2023. Abdomen pelvis 01/11/2023. FINDINGS: CT CHEST FINDINGS Cardiovascular: Right IJ chest port. Port is accessed. Tip seen along the SVC right atrial  junction. Heart is slightly large. Mild-to-moderate pericardial effusion is again seen. Prominent calcifications along the aortic valve and along the coronary arteries. Mild calcified plaque along the course of the thoracic aorta. Mediastinum/Nodes: Slightly patulous thoracic esophagus. Preserved thyroid gland. No specific abnormal lymph node enlargement identified in the axillary regions, hilum or mediastinum. Portions of the left upper chest are obscured by the extreme streak artifact from the left hip arthroplasty. Lungs/Pleura: Bilateral pleural effusions are seen, small left and moderate right. The right side has some loculated components as seen previous. Additional lungs demonstrates some scattered ground-glass in a perihilar distribution on the right as well as some bronchial wall thickening and more confluent opacity at the right lower lobe and middle lobe. Consolidative area with air bronchograms in the posterior left upper lobe are also again seen. Some perihilar areas left lower lobe. The amount opacity seen at the left upper lung is appears slightly decreased from previous. No new dominant lung mass. Musculoskeletal: Streak artifact related to the patient's left shoulder arthroplasty. Advanced degenerative changes of the right shoulder. Scattered degenerative changes along the spine with some curvature. CT ABDOMEN PELVIS FINDINGS Hepatobiliary: Previous cholecystectomy. Patent portal vein. Benign-appearing scattered hepatic cystic foci identified such as left hepatic lobe series 2, image 54, unchanged. Heterogeneous lesions in the right hepatic lobe are again noted. The lesion which previously measured 3.5 x 4.1 cm, today on series 2, image 56 measures 3.9 x 3.7 cm with central area of necrosis. Lesions caudal this appears similar as well but the margins are less well defined. Pancreas: Unremarkable. No pancreatic ductal dilatation or surrounding inflammatory changes. Spleen: Normal in size without  focal abnormality. Adrenals/Urinary Tract: Stable thickening of the left adrenal gland. Right adrenal gland is preserved. No enhancing renal mass or collecting system dilatation. Area of focal atrophy along the posterior aspect of the lower pole of the right kidney is stable. The ureters have normal course and caliber extending down  to the urinary bladder. Bladder is underdistended. Stomach/Bowel: Surgical changes of previous gastric bypass. Limit extends superior and anterior to the stomach. Small bowel is nondilated. Large bowel is normal course and caliber with scattered stool and diverticula. Normal appendix in the right lower quadrant. Previous changes of colitis no longer identified. Vascular/Lymphatic: Aortic atherosclerosis. No enlarged abdominal or pelvic lymph nodes. Reproductive: Dystrophic calcification along the uterus may be a calcified fibroid, unchanged from previous. No separate adnexal mass Other: Anasarca. Mesenteric stranding with minimal layering areas of fluid, nonspecific. No free air. Rectus muscle diastasis with protuberance and herniation along the anterior abdominal wall. Musculoskeletal: Advanced degenerative changes along the spine. Multilevel stenosis. Right hip arthroplasty. IMPRESSION: Overall no significant interval change. Persistent bilateral pleural effusions, right-greater-than-left with the adjacent parenchymal opacities including some confluent areas consolidation and some ground-glass. No developing nodal enlargement. Stable pericardial effusion. Stable right hepatic lobe liver lesions. The more caudal smaller focus in the right hepatic lobe is less well defined along its margins than the previous. Colonic diverticulosis.  No obstruction. Electronically Signed   By: Karen Kays M.D.   On: 06/07/2023 10:27     EKG None  Radiology CT Chest W Contrast Result Date: 06/24/2023 CLINICAL DATA:  History of lung cancer increased short of breath EXAM: CT CHEST WITH CONTRAST  TECHNIQUE: Multidetector CT imaging of the chest was performed during intravenous contrast administration. RADIATION DOSE REDUCTION: This exam was performed according to the departmental dose-optimization program which includes automated exposure control, adjustment of the mA and/or kV according to patient size and/or use of iterative reconstruction technique. CONTRAST:  75mL OMNIPAQUE IOHEXOL 300 MG/ML  SOLN COMPARISON:  Chest x-ray 06/24/2023, CT 06/04/2023, 04/19/2023, 07/17/2021, 03/02/2020, PET CT 12/17/2018 FINDINGS: Cardiovascular: Right-sided central venous port with tip at the distal SVC. Moderate aortic atherosclerosis. No aneurysm or dissection. Cardiomegaly. Small pericardial effusion. Aortic valvular calcification. Mediastinum/Nodes: Patent trachea. No thyroid mass. Subcentimeter mediastinal lymph nodes. Esophagus within normal limits. Lungs/Pleura: Small loculated left pleural effusion without substantial change compared to most recent prior exam from March. Slight interval increase in loculated right pleural effusion compared to most recent prior. Stable post therapeutic distortion and consolidation in the left upper lobe. Otherwise similar pattern of consolidations at the right middle and bilateral lower lobes compared to most recent prior. Similar pattern of perihilar ground-glass density. Upper Abdomen: Hepatic metastatic lesions at the right hepatic lobe with index lesion measuring about 35 x 40 mm, previously 39 x 37 mm. No acute finding. Postsurgical changes of the stomach consistent with prior gastric bypass surgery Musculoskeletal: Generalized subcutaneous edema. No acute or suspicious osseous abnormality. IMPRESSION: 1. Slight interval increase in loculated right pleural effusion compared to most recent prior exam from March. Stable small loculated left pleural effusion. 2. Stable post therapeutic distortion and consolidation in the left upper lobe. Otherwise similar pattern of consolidations  at the right middle and bilateral lower lobes compared to most recent prior. Similar pattern of perihilar ground-glass density. 3. Cardiomegaly with small pericardial effusion. 4. Hepatic metastatic lesions, similar to prior. 5. Generalized subcutaneous edema. 6. Aortic atherosclerosis. Aortic Atherosclerosis (ICD10-I70.0). Electronically Signed   By: Jasmine Pang M.D.   On: 06/24/2023 19:05   DG Chest Port 1 View Result Date: 06/24/2023 CLINICAL DATA:  Increasing shortness of breath. History of lung cancer. EXAM: PORTABLE CHEST 1 VIEW COMPARISON:  Radiograph 06/16/2023, chest CT 06/04/2023 FINDINGS: Right chest port in place. Partially loculated right pleural effusion has increased from prior exam. Left pleural effusion is similar.  Peripheral left upper lobe opacity without interval change. No pneumothorax or pulmonary edema. Stable heart size and mediastinal contours. Left shoulder arthroplasty. Advanced right shoulder arthropathy. IMPRESSION: 1. Partially loculated right pleural effusion has increased from prior exam. 2. Left pleural effusion is similar. 3. Peripheral left upper lobe opacity without interval change. Electronically Signed   By: Narda Rutherford M.D.   On: 06/24/2023 16:28    Procedures Procedures    Medications Ordered in ED Medications  diltiazem (CARDIZEM) 1 mg/mL load via infusion 10 mg (has no administration in time range)    And  diltiazem (CARDIZEM) 125 mg in dextrose 5% 125 mL (1 mg/mL) infusion (has no administration in time range)  cefTRIAXone (ROCEPHIN) 1 g in sodium chloride 0.9 % 100 mL IVPB (has no administration in time range)  azithromycin (ZITHROMAX) 500 mg in sodium chloride 0.9 % 250 mL IVPB (has no administration in time range)  albuterol (PROVENTIL) (2.5 MG/3ML) 0.083% nebulizer solution 5 mg (5 mg Nebulization Given 06/24/23 1716)  ipratropium (ATROVENT) nebulizer solution 0.5 mg (0.5 mg Nebulization Given 06/24/23 1716)  lactated ringers bolus 500 mL (0 mLs  Intravenous Stopped 06/24/23 1755)  iohexol (OMNIPAQUE) 300 MG/ML solution 75 mL (75 mLs Intravenous Contrast Given 06/24/23 1805)    ED Course/ Medical Decision Making/ A&P                                 Medical Decision Making Problems Addressed: Acute on chronic respiratory failure with hypoxia [facility]): acute illness or injury with systemic symptoms that poses a threat to life or bodily functions Atrial fibrillation with rapid ventricular response (HCC): acute illness or injury with systemic symptoms that poses a threat to life or bodily functions Primary malignant neoplasm of left lung metastatic to other site [facility]): chronic illness or injury with exacerbation, progression, or side effects of treatment that poses a threat to life or bodily functions Recurrent right pleural effusion: chronic illness or injury with exacerbation, progression, or side effects of treatment that poses a threat to life or bodily functions Shortness of breath: acute illness or injury with systemic symptoms that poses a threat to life or bodily functions  Amount and/or Complexity of Data Reviewed External Data Reviewed: notes. Labs: ordered. Decision-making details documented in ED Course. Radiology: ordered and independent interpretation performed. Decision-making details documented in ED Course. ECG/medicine tests: ordered and independent interpretation performed. Decision-making details documented in ED Course. Discussion of management or test interpretation with external provider(s): medicine  Risk Prescription drug management. Decision regarding hospitalization.   Iv ns. Continuous pulse ox and cardiac monitoring. Labs ordered/sent. Imaging ordered.   Differential diagnosis includes pna, chf, worsening pleural effusions, etc. Dispo decision including potential need for admission considered - will get labs and imaging and reassess.   Reviewed nursing notes and prior charts for additional history. External  reports reviewed. Additional history from:  Cardiac monitor: sinus rhythm, rate 120.  Labs reviewed/interpreted by me - wbc 9, normal. Hct 30 c/w prior.   Xrays reviewed/interpreted by me - increased right effusion. Consolidation ?possible pna. Rocephin and zithromax iv.   Albuterol and atrovent neb.   Given increased dyspnea, increased o2 requirement, will admit to medicine.   Recheck, hr 140, irregular, ?afib/rvr. Cardizem bolus/gtt.   Dyspnea appears multifactorial, cancer, effusions, wheezing, tachy, etc.   Hospitalists  consulted for admission - pt may benefit from admission to [facility] where can get multi-specialty care/intervention.  CRITICAL CARE RE:  dyspnea/shortness of breath, loculated pleural effusions, metastatic lung cancer, tachycardia/afib/rvr,  Performed by: Suzi Roots Total critical care time: 90 minutes Critical care time was exclusive of separately billable procedures and treating other patients. Critical care was necessary to treat or prevent imminent or life-threatening deterioration. Critical care was time spent personally by me on the following activities: development of treatment plan with patient and/or surrogate as well as nursing, discussions with consultants, evaluation of patient's response to treatment, examination of patient, obtaining history from patient or surrogate, ordering and performing treatments and interventions, ordering and review of laboratory studies, ordering and review of radiographic studies, pulse oximetry and re-evaluation of patient's condition.         Final Clinical Impression(s) / ED Diagnoses Final diagnoses:  Shortness of breath  Acute on chronic respiratory failure with hypoxia (HCC)  Recurrent right pleural effusion  Primary malignant neoplasm of left lung metastatic to other site [facility])  Atrial fibrillation with rapid ventricular response [facility])    Rx / DC Orders ED Discharge Orders     None         Cathren Laine, MD 06/24/23 1932

## 2023-06-24 NOTE — Assessment & Plan Note (Signed)
 CT with contrast today-  slight interval increase in loculated right pleural effusion compared to most recent prior exam from March. Stable small loculated left pleural effusion.  Right effusion was drained during recent hospitalization 3/28. -Repeat ultrasound-guided thoracentesis in a.m.

## 2023-06-24 NOTE — H&P (Addendum)
 History and Physical    Denise [name] UUV:[id number] DOB: 21-Apr-1950 DOA: 06/24/2023  PCP: Mirna Mires, MD   Patient coming from: Home  I have personally briefly reviewed patient's old medical records in [facility]  Chief Complaint: Difficulty Breathing  HPI: Denise [name] is a 73 y.o. female with extensive medical history significant for left lung adenocarcinoma with brain metastasis, chronic respiratory failure on 2 to 4 L, atrial fibrillation, aortic stenosis, hypertension, pulmonary embolism diabetes mellitus. Recent hospitalization 3/26 to 3/29 for acute hypoxic respiratory failure secondary to right pleural effusion, atrial fibrillation with RVR.  She underwent thoracentesis 3/28 with drainage of 550 mL of dark red colored pleural fluid.  Pathology results-no malignant cells identified, cultures and Gram stain were negative.  She presents today with recurrence of her difficulty breathing over the past 2 days.  She denies swelling to her lower extremities.  She reports compliance with her Lasix and Xarelto.  No chest pain.  No cough.  No fevers no chills.  She denies palpitations, no chest pain.  ED Course: Temperature 98.9.  Heart rate 93-50.  Respiratory rate 24-31.  Blood pressure systolic down to 47/42 improved to 143/116.  O2 sats down to 86% on home 4 L, on 5 L sats > 94%. WBC 9.  BNP 406.  Troponin 19.  COVID influenza RSV negative. EDP reports rhonchi on exam. CT chest with contrast showed slight interval increase in loculated right pleural effusion compared to most recent prior exam.  Able small loculated left pleural effusion.  Also shows similar pattern of consolidations left upper lobe, right middle and bilateral lower lobes compared to recent.  Similar perihilar groundglass density.  IV ceftriaxone and azithromycin given. Cardizem bolus 10 mg x 1 and drip started. 500 mL bolus given.  Hospitalist to admit for acute hypoxic respiratory failure, and atrial fibrillation with  RVR.  Review of Systems: As per HPI all other systems reviewed and negative.  Past Medical History:  Diagnosis Date   Anemia    Aortic stenosis    Arthritis    Cancer (HCC)    lung cancer 2019   Coronary artery calcification seen on CT scan    Dyspnea    Essential hypertension    GERD (gastroesophageal reflux disease)    History of lung cancer    Stage III adenocarcinoma status post chemoradiation   Port-A-Cath in place 01/06/2019   Type 2 diabetes mellitus [facility])     Past Surgical History:  Procedure Laterality Date   BIOPSY  09/25/2022   Procedure: BIOPSY;  Surgeon: Lanelle Bal, DO;  [facility];  Service: Endoscopy;;   CHOLECYSTECTOMY  1997   COLONOSCOPY     COLONOSCOPY WITH PROPOFOL N/A 09/25/2022   Procedure: COLONOSCOPY WITH PROPOFOL;  Surgeon: Lanelle Bal, DO;  Location: [facility];  Service: Endoscopy;  Laterality: N/A;  11:00;ASA 3   ESOPHAGEAL BRUSHING  09/25/2022   Procedure: ESOPHAGEAL BRUSHING;  Surgeon: Lanelle Bal, DO;  [facility]: [facility];  Service: Endoscopy;;   ESOPHAGOGASTRODUODENOSCOPY (EGD) WITH PROPOFOL N/A 09/25/2022   Procedure: ESOPHAGOGASTRODUODENOSCOPY (EGD) WITH PROPOFOL;  Surgeon: Lanelle Bal, DO;  [facility]: [facility];  Service: Endoscopy;  Laterality: N/A;  11:00AM;ASA 3   GASTRIC BYPASS     INCISIONAL HERNIA REPAIR  04/11/11   IR IMAGING GUIDED PORT INSERTION  12/27/2018   Right   IR US GUIDE BX ASP/DRAIN  08/08/2021   LAPAROSCOPIC SALPINGOOPHERECTOMY     LAPAROTOMY  04/11/2011  Procedure: EXPLORATORY LAPAROTOMY;  Surgeon: Clovis Pu. Cornett, MD;  Location: [facility] OR[facility];  Service: General;  Laterality: N/A;  closure port hole   POLYPECTOMY  09/25/2022   Procedure: POLYPECTOMY;  Surgeon: Lanelle Bal, DO;  Location: [facility];  Service: Endoscopy;;   RIGHT/LEFT HEART CATH AND CORONARY ANGIOGRAPHY N/A 12/29/2019   Procedure: RIGHT/LEFT HEART CATH AND CORONARY ANGIOGRAPHY;  Surgeon: Lennette Bihari, MD;   Location: [facility] INV[facility] CV LAB;  Service: Cardiovascular;  Laterality: N/A;   TOTAL HIP ARTHROPLASTY  03/07/2012   Procedure: TOTAL HIP ARTHROPLASTY ANTERIOR APPROACH;  Surgeon: Shelda Pal, MD;  Location: [facility];  Service: Orthopedics;  Laterality: Right;   TOTAL SHOULDER ARTHROPLASTY Left 01/21/2015   TOTAL SHOULDER ARTHROPLASTY Left 01/21/2015   Procedure: LEFT TOTAL SHOULDER ARTHROPLASTY;  Surgeon: Francena Hanly, MD;  Location: [facility] OR;  Service: Orthopedics;  Laterality: Left;   VAGINAL HYSTERECTOMY       reports that she quit smoking about 47 years ago. Her smoking use included cigarettes. She started smoking about 59 years ago. She has a 12 pack-year smoking history. She has been exposed to tobacco smoke. She has never used smokeless tobacco. She reports that she does not drink alcohol and does not use drugs.  No Known Allergies  Family History  Problem Relation Age of Onset   Breast cancer Mother    COPD Mother    Arthritis Mother    Diabetes Mother    Hypertension Mother    Hypertension Father    Diabetes Father    Breast cancer Sister    Thyroid cancer Brother    Heart attack Brother    Huntington's disease Maternal Grandmother    Prior to Admission medications   Medication Sig Start Date End Date Taking? Authorizing Provider  acetaminophen (TYLENOL) 500 MG tablet Take 500 mg by mouth every 6 (six) hours as needed for moderate pain.   Yes [provider]  albuterol (PROVENTIL) (2.5 MG/3ML) 0.083% nebulizer solution Take 3 mLs (2.5 mg total) by nebulization every 6 (six) hours as needed for wheezing or shortness of breath. 06/07/23  Yes Doreatha Massed, MD  albuterol (VENTOLIN HFA) 108 (90 Base) MCG/ACT inhaler Inhale 2 puffs into the lungs every 6 (six) hours as needed for wheezing or shortness of breath. 01/02/23  Yes Ghimire, Werner Lean, MD  Calcium Carbonate Antacid (TUMS PO) Take 2-3 tablets by mouth daily.   Yes [provider]  carvedilol (COREG)  3.125 MG tablet Take 1 tablet (3.125 mg total) by mouth 2 (two) times daily. 02/27/23  Yes Strader, [place] M, PA-C  ciprofloxacin (CILOXAN) 0.3 % ophthalmic solution Place 1 drop into both eyes every 4 (four) hours while awake. Administer 1 drop, every 2 hours, while awake, for 2 days. Then 1 drop, every 4 hours, while awake, for the next 5 days. 04/30/23  Yes Doreatha Massed, MD  Cyanocobalamin (VITAMIN B-12) 2500 MCG SUBL Place 2,500 mcg under the tongue every morning.    Yes [provider]  ferrous sulfate 325 (65 FE) MG tablet Take 325 mg by mouth every evening.   Yes [provider]  folic acid (FOLVITE) 1 MG tablet TAKE 1 TABLET BY MOUTH EVERY DAY 03/19/23  Yes Doreatha Massed, MD  gabapentin (NEURONTIN) 600 MG tablet Take 600 mg by mouth at bedtime. 11/22/22  Yes [provider]  Glycerin-Hypromellose-PEG 400 (DRY EYE RELIEF DROPS OP) Apply 1 drop to eye daily as needed (dry eyes).   Yes [provider]  loteprednol (LOTEMAX) 0.5 % ophthalmic suspension Place 1 drop into both eyes 2 (two) times daily. 05/07/23  Yes [provider]  magnesium oxide (MAG-OX) 400 MG tablet Take 1 tablet by mouth 2 (two) times daily. 05/22/23  Yes [provider]  midodrine (PROAMATINE) 5 MG tablet Take 1 tablet (5 mg total) by mouth 3 (three) times daily with meals. 02/27/23  Yes Strader, [place] M, PA-C  Multiple Vitamin (MULITIVITAMIN WITH MINERALS) TABS Take 1 tablet by mouth daily.   Yes [provider]  omeprazole (PRILOSEC) 20 MG capsule Take 20 mg by mouth daily. 11/18/22  Yes [provider]  Potassium Chloride ER 20 MEQ TBCR Take 1 tablet by mouth daily. 01/01/23  Yes [provider]  rivaroxaban (XARELTO) 20 MG TABS tablet Take 1 tablet (20 mg total) by mouth daily with supper. 04/30/23  Yes Doreatha Massed, MD    Physical Exam: Vitals:   06/24/23 1921 06/24/23 1949 06/24/23 1950 06/24/23 1951  BP:       Pulse: (!) 132 (!) 130 (!) 129 (!) 122  Resp: (!) 32 (!) 30 (!) 23 (!) 24  Temp:      TempSrc:      SpO2: (!) 88% 95% 96% 95%  Weight:      Height:        Constitutional: NAD, calm, comfortable Vitals:   06/24/23 1921 06/24/23 1949 06/24/23 1950 06/24/23 1951  BP:      Pulse: (!) 132 (!) 130 (!) 129 (!) 122  Resp: (!) 32 (!) 30 (!) 23 (!) 24  Temp:      TempSrc:      SpO2: (!) 88% 95% 96% 95%  Weight:      Height:       Eyes: PERRL, lids and conjunctivae normal ENMT: Mucous membranes are moist.  Neck: normal, supple, no masses, no thyromegaly Respiratory: clear to auscultation bilaterally, no wheezing, no crackles. Normal respiratory effort. No accessory muscle use.  Cardiovascular: Tachycardic irregular rate and rhythm, 3/6 known systolic murmur / rubs / gallops. Trace extremity edema.  Wearing compression device. Abdomen: no tenderness, no masses palpated. No hepatosplenomegaly. Bowel sounds positive.  Musculoskeletal: no clubbing / cyanosis. No joint deformity upper and lower extremities. Skin: no rashes, lesions, ulcers. No induration Neurologic: No facial asymmetry, moving extremities spontaneously, speech fluent.  Psychiatric: Normal judgment and insight. Alert and oriented x 3. Normal mood.   Labs on Admission: I have personally reviewed following labs and imaging studies  CBC: Recent Labs  Lab 06/24/23 1604  WBC 9.0  HGB 8.7*  HCT 30.4*  MCV 101.7*  PLT 350   Basic Metabolic Panel: Recent Labs  Lab 06/24/23 1604  NA 140  K 3.6  CL 94*  CO2 34*  GLUCOSE 99  BUN 13  CREATININE 0.74  CALCIUM 9.0   GFR: Estimated Creatinine Clearance: 68.1 mL/min (by C-G formula based on SCr of 0.74 mg/dL). Liver Function Tests: Recent Labs  Lab 06/24/23 1604  AST 15  ALT 11  ALKPHOS 71  BILITOT 0.5  PROT 5.9*  ALBUMIN 2.4*   Urine analysis:    Component Value Date/Time   COLORURINE COLORLESS (A) 04/19/2023 0320   APPEARANCEUR CLEAR 04/19/2023 0320    LABSPEC 1.010 04/19/2023 0320   PHURINE 5.0 04/19/2023 0320   GLUCOSEU NEGATIVE 04/19/2023 0320   HGBUR NEGATIVE 04/19/2023 0320   BILIRUBINUR NEGATIVE 04/19/2023 0320   KETONESUR NEGATIVE 04/19/2023 0320   PROTEINUR NEGATIVE 04/19/2023 0320   UROBILINOGEN 1.0 03/05/2012 1200  NITRITE NEGATIVE 04/19/2023 0320   LEUKOCYTESUR NEGATIVE 04/19/2023 0320    Radiological Exams on Admission: CT Chest W Contrast Result Date: 06/24/2023 CLINICAL DATA:  History of lung cancer increased short of breath EXAM: CT CHEST WITH CONTRAST TECHNIQUE: Multidetector CT imaging of the chest was performed during intravenous contrast administration. RADIATION DOSE REDUCTION: This exam was performed according to the departmental dose-optimization program which includes automated exposure control, adjustment of the mA and/or kV according to patient size and/or use of iterative reconstruction technique. CONTRAST:  75mL OMNIPAQUE IOHEXOL 300 MG/ML  SOLN COMPARISON:  Chest x-ray 06/24/2023, CT 06/04/2023, 04/19/2023, 07/17/2021, 03/02/2020, PET CT 12/17/2018 FINDINGS: Cardiovascular: Right-sided central venous port with tip at the distal SVC. Moderate aortic atherosclerosis. No aneurysm or dissection. Cardiomegaly. Small pericardial effusion. Aortic valvular calcification. Mediastinum/Nodes: Patent trachea. No thyroid mass. Subcentimeter mediastinal lymph nodes. Esophagus within normal limits. Lungs/Pleura: Small loculated left pleural effusion without substantial change compared to most recent prior exam from March. Slight interval increase in loculated right pleural effusion compared to most recent prior. Stable post therapeutic distortion and consolidation in the left upper lobe. Otherwise similar pattern of consolidations at the right middle and bilateral lower lobes compared to most recent prior. Similar pattern of perihilar ground-glass density. Upper Abdomen: Hepatic metastatic lesions at the right hepatic lobe with index  lesion measuring about 35 x 40 mm, previously 39 x 37 mm. No acute finding. Postsurgical changes of the stomach consistent with prior gastric bypass surgery Musculoskeletal: Generalized subcutaneous edema. No acute or suspicious osseous abnormality. IMPRESSION: 1. Slight interval increase in loculated right pleural effusion compared to most recent prior exam from March. Stable small loculated left pleural effusion. 2. Stable post therapeutic distortion and consolidation in the left upper lobe. Otherwise similar pattern of consolidations at the right middle and bilateral lower lobes compared to most recent prior. Similar pattern of perihilar ground-glass density. 3. Cardiomegaly with small pericardial effusion. 4. Hepatic metastatic lesions, similar to prior. 5. Generalized subcutaneous edema. 6. Aortic atherosclerosis. Aortic Atherosclerosis (ICD10-I70.0). Electronically Signed   By: Jasmine Pang M.D.   On: 06/24/2023 19:05   DG Chest Port 1 View Result Date: 06/24/2023 CLINICAL DATA:  Increasing shortness of breath. History of lung cancer. EXAM: PORTABLE CHEST 1 VIEW COMPARISON:  Radiograph 06/16/2023, chest CT 06/04/2023 FINDINGS: Right chest port in place. Partially loculated right pleural effusion has increased from prior exam. Left pleural effusion is similar. Peripheral left upper lobe opacity without interval change. No pneumothorax or pulmonary edema. Stable heart size and mediastinal contours. Left shoulder arthroplasty. Advanced right shoulder arthropathy. IMPRESSION: 1. Partially loculated right pleural effusion has increased from prior exam. 2. Left pleural effusion is similar. 3. Peripheral left upper lobe opacity without interval change. Electronically Signed   By: Narda Rutherford M.D.   On: 06/24/2023 16:28    EKG: Independently reviewed.  Tachycardic heart rate 168, QTc 440.  Assessment/Plan Principal Problem:   Acute on chronic respiratory failure (HCC) Active Problems:   Atrial  fibrillation with RVR (HCC)   Loculated pleural effusion   Port-A-Cath in place   Pulmonary embolus (HCC)   Type 2 diabetes mellitus (HCC)   Essential hypertension   Aortic stenosis   Adenocarcinoma of left lung (HCC)   Iron deficiency anemia  Assessment and Plan: * Acute on chronic respiratory failure (HCC) O2 sats down to 86% on home 2 to 4 L, on 4 L with activity.  No peripheral signs of volume overload.  Weight stable from recent hospitalization.  Lungs  clear on my evaluation.  She reports compliance with Xarelto.  CT chest with contrast shows slight increase in loculated right pleural effusion.  Also stable consolidations- LUL, RML, bilateral lower lobes. - Recent hospitalization, patient underwent thoracentesis 3/28-with drainage of of pleural fluid, pathology was negative for malignant cells, cultures and gram stain negative -ABG-pH of 7.46, pCO2 of 56. -COVID influenza RSV negative -Will order repeat ultrasound-guided thoracentesis -Hold Xarelto for thoracentesis, resume tomorrow - ?? Pneumonia, afebrile without leukocytosis, no cough, check procalcitonin -IV ceftriaxone and azithromycin given in the ED, further antibiotics held for now. -Will benefit from palliative care -Consider BiPAP if worsening tachypnea or dyspnea. -Hold Xarelto for thoracentesis, last dose was last night- 4/5  Loculated pleural effusion CT with contrast today-  slight interval increase in loculated right pleural effusion compared to most recent prior exam from March. Stable small loculated left pleural effusion.  Right effusion was drained during recent hospitalization 3/28. -Repeat ultrasound-guided thoracentesis in a.m.   Atrial fibrillation with RVR (HCC) Tachycardic to 150.  Reports compliance with carvedilol and Xarelto. -Cardizem drip 10 mg x 1 and drip started, continue -Hold Xarelto for ultrasound guided thoracentesis in a.m. resume after - Hold carvedilol for now pending stable blood  pressure  Type 2 diabetes mellitus (HCC) Diet controlled. - Hgba1c -Check morning fasting CBGs  Pulmonary embolus (HCC) Resume Xarelto  Essential hypertension Was hypotensive down to 89/58, was given 500 mL bolus with improvement in blood pressure. -Continue Cardizem drip, wean off as able - Hold carvedilol for now -Resume midodrine  Aortic stenosis Last echo 12/2022-aortic valve appear significantly stenotic.  Per cardiology notes 02/2023, patient not felt to be/may not to be a candidate for TAVR given metastatic lung cancer.  Iron deficiency anemia Hemoglobin stable at 8.7.  Adenocarcinoma of left lung (HCC) Follows with Dr. Ellin Saba.  Last visit 06/07/2023, tolerated last treatment well.  Was referred to Dr. Sherene Sires for dyspnea.   DVT prophylaxis: SCDs for now, resume Xarelto after thoracentesis Code Status: FULL code-confirmed with patient at bedside Family Communication:  None at bedside Disposition Plan: ~ 2 days Consults called: None Admission status: Inpt Stepdown I certify that at the point of admission it is my clinical judgment that the patient will require inpatient hospital care spanning beyond 2 midnights from the point of admission due to high intensity of service, high risk for further deterioration and high frequency of surveillance required.   CRITICAL CARE Performed by: Onnie Boer   Total critical care time: 70 minutes  Critical care time was exclusive of separately billable procedures and treating other patients.  Critical care was necessary to treat or prevent imminent or life-threatening deterioration.  Critical care was time spent personally by me on the following activities: development of treatment plan with patient and/or surrogate as well as nursing, discussions with consultants, evaluation of patient's response to treatment, examination of patient, obtaining history from patient or surrogate, ordering and performing treatments and  interventions, ordering and review of laboratory studies, ordering and review of radiographic studies, pulse oximetry and re-evaluation of patient's condition.  Author: Onnie Boer, MD 06/24/2023 9:43 PM  For on call review [email or web address].

## 2023-06-24 NOTE — ED Triage Notes (Signed)
 Pt c/o increased sob x 2 days. Pt has hx of lung cancer and is on continuous o2 at 2lpm and 4lpm when ambulating.

## 2023-06-24 NOTE — Assessment & Plan Note (Addendum)
 O2 sats down to 86% on home 2 to 4 L, on 4 L with activity.  No peripheral signs of volume overload.  Weight stable from recent hospitalization.  Lungs clear on my evaluation.  She reports compliance with Xarelto.  CT chest with contrast shows slight increase in loculated right pleural effusion.  Also stable consolidations- LUL, RML, bilateral lower lobes. - Recent hospitalization, patient underwent thoracentesis 3/28-with drainage of of pleural fluid, pathology was negative for malignant cells, cultures and gram stain negative -ABG-pH of 7.46, pCO2 of 56. -COVID influenza RSV negative -Will order repeat ultrasound-guided thoracentesis -Hold Xarelto for thoracentesis, resume tomorrow - ?? Pneumonia, afebrile without leukocytosis, no cough, check procalcitonin -IV ceftriaxone and azithromycin given in the ED, further antibiotics held for now. -Will benefit from palliative care -Consider BiPAP if worsening tachypnea or dyspnea. -Hold Xarelto for thoracentesis, last dose was last night- 4/5

## 2023-06-24 NOTE — Assessment & Plan Note (Signed)
 Hemoglobin stable at 8.7.

## 2023-06-24 NOTE — Assessment & Plan Note (Signed)
 Diet controlled. - Hgba1c -Check morning fasting CBGs

## 2023-06-24 NOTE — Assessment & Plan Note (Signed)
 Follows with Dr. Ellin Saba.  Last visit 06/07/2023, tolerated last treatment well.  Was referred to Dr. Sherene Sires for dyspnea.

## 2023-06-24 NOTE — Assessment & Plan Note (Addendum)
 Tachycardic to 150.  Reports compliance with carvedilol and Xarelto. -Cardizem drip 10 mg x 1 and drip started, continue -Hold Xarelto for ultrasound guided thoracentesis in a.m. resume after - Hold carvedilol for now pending stable blood pressure

## 2023-06-24 NOTE — ED Notes (Signed)
 AC to bring cardizem gtt from main pharm.

## 2023-06-25 ENCOUNTER — Inpatient Hospital Stay (HOSPITAL_COMMUNITY)

## 2023-06-25 DIAGNOSIS — J9621 Acute and chronic respiratory failure with hypoxia: Secondary | ICD-10-CM | POA: Diagnosis not present

## 2023-06-25 DIAGNOSIS — I350 Nonrheumatic aortic (valve) stenosis: Secondary | ICD-10-CM | POA: Diagnosis not present

## 2023-06-25 DIAGNOSIS — I950 Idiopathic hypotension: Secondary | ICD-10-CM | POA: Diagnosis not present

## 2023-06-25 DIAGNOSIS — I4891 Unspecified atrial fibrillation: Secondary | ICD-10-CM | POA: Diagnosis not present

## 2023-06-25 LAB — PROCALCITONIN: Procalcitonin: <0.1 ng/mL

## 2023-06-25 LAB — CBC
HCT: 32.1 % — ABNORMAL LOW (ref 36.0–46.0)
Hemoglobin: 9.1 g/dL — ABNORMAL LOW (ref 12.0–15.0)
MCH: 28.8 pg (ref 26.0–34.0)
MCHC: 28.3 g/dL — ABNORMAL LOW (ref 30.0–36.0)
MCV: 101.6 fL — ABNORMAL HIGH (ref 80.0–100.0)
Platelets: 383 10*3/uL (ref 150–400)
RBC: 3.16 MIL/uL — ABNORMAL LOW (ref 3.87–5.11)
RDW: 16.3 % — ABNORMAL HIGH (ref 11.5–15.5)
WBC: 11.4 10*3/uL — ABNORMAL HIGH (ref 4.0–10.5)
nRBC: 0 % (ref 0.0–0.2)

## 2023-06-25 LAB — BLOOD GAS, ARTERIAL
Acid-Base Excess: 13.5 mmol/L — ABNORMAL HIGH (ref 0.0–2.0)
Bicarbonate: 39.8 mmol/L — ABNORMAL HIGH (ref 20.0–28.0)
O2 Saturation: 92 %
Patient temperature: 37
pCO2 arterial: 56 mmHg — ABNORMAL HIGH (ref 32–48)
pH, Arterial: 7.46 — ABNORMAL HIGH (ref 7.35–7.45)
pO2, Arterial: 58 mmHg — ABNORMAL LOW (ref 83–108)

## 2023-06-25 LAB — BASIC METABOLIC PANEL WITH GFR
Anion gap: 15 (ref 5–15)
BUN: 10 mg/dL (ref 8–23)
CO2: 32 mmol/L (ref 22–32)
Calcium: 9 mg/dL (ref 8.9–10.3)
Chloride: 96 mmol/L — ABNORMAL LOW (ref 98–111)
Creatinine, Ser: 0.72 mg/dL (ref 0.44–1.00)
GFR, Estimated: >60 mL/min (ref >60)
Glucose, Bld: 81 mg/dL (ref 70–99)
Potassium: 3.9 mmol/L (ref 3.5–5.1)
Sodium: 143 mmol/L (ref 135–145)

## 2023-06-25 LAB — LACTATE DEHYDROGENASE: LDH: 168 U/L (ref 98–192)

## 2023-06-25 LAB — HEMOGLOBIN A1C
Hgb A1c MFr Bld: 5.8 % — ABNORMAL HIGH (ref 4.8–5.6)
Mean Plasma Glucose: 119.76 mg/dL

## 2023-06-25 MED ORDER — SODIUM CHLORIDE 0.9 % IV SOLN
1.0000 g | INTRAVENOUS | Status: DC
  Administered 2023-06-25 – 2023-06-26 (×2): 1 g via INTRAVENOUS
  Filled 2023-06-25 (×2): qty 10

## 2023-06-25 MED ORDER — SODIUM CHLORIDE 0.9 % IV SOLN
500.0000 mg | INTRAVENOUS | Status: DC
  Administered 2023-06-25 – 2023-06-26 (×2): 500 mg via INTRAVENOUS
  Filled 2023-06-25 (×2): qty 5

## 2023-06-25 MED ORDER — RIVAROXABAN 20 MG PO TABS
20.0000 mg | ORAL_TABLET | Freq: Every day | ORAL | Status: DC
  Administered 2023-06-25 – 2023-06-26 (×2): 20 mg via ORAL
  Filled 2023-06-25 (×2): qty 1

## 2023-06-25 MED ORDER — ORAL CARE MOUTH RINSE: 15.0000 mL | OROMUCOSAL | Status: DC | PRN

## 2023-06-25 MED ORDER — ENSURE ENLIVE PO LIQD
237.0000 mL | Freq: Two times a day (BID) | ORAL | Status: DC
Start: 2023-06-26 — End: 2023-06-27
  Administered 2023-06-27: 237 mL via ORAL

## 2023-06-25 MED ORDER — GABAPENTIN 300 MG PO CAPS
600.0000 mg | ORAL_CAPSULE | Freq: Every day | ORAL | Status: DC
  Administered 2023-06-25 – 2023-06-26 (×2): 600 mg via ORAL
  Filled 2023-06-25 (×2): qty 2

## 2023-06-25 MED ORDER — MIDODRINE HCL 5 MG PO TABS
10.0000 mg | ORAL_TABLET | Freq: Three times a day (TID) | ORAL | Status: DC
  Administered 2023-06-25: 10 mg via ORAL
  Administered 2023-06-25: 5 mg via ORAL
  Administered 2023-06-26 – 2023-06-27 (×5): 10 mg via ORAL
  Filled 2023-06-25 (×7): qty 2

## 2023-06-25 MED ORDER — ALPRAZOLAM 0.25 MG PO TABS
0.2500 mg | ORAL_TABLET | Freq: Two times a day (BID) | ORAL | Status: DC | PRN
  Administered 2023-06-25 – 2023-06-26 (×4): 0.25 mg via ORAL
  Filled 2023-06-25 (×5): qty 1

## 2023-06-25 NOTE — Plan of Care (Signed)
  Problem: Education: Goal: Knowledge of General Education information will improve Description: Including pain rating scale, medication(s)/side effects and non-pharmacologic comfort measures Outcome: Progressing   Problem: Health Behavior/Discharge Planning: Goal: Ability to manage health-related needs will improve Outcome: Progressing   Problem: Clinical Measurements: Goal: Ability to maintain clinical measurements within normal limits will improve Outcome: Progressing Goal: Will remain free from infection Outcome: Progressing Goal: Diagnostic test results will improve Outcome: Progressing Goal: Respiratory complications will improve Outcome: Progressing   Problem: Activity: Goal: Risk for activity intolerance will decrease Outcome: Progressing   Problem: Nutrition: Goal: Adequate nutrition will be maintained Outcome: Progressing   Problem: Coping: Goal: Level of anxiety will decrease Outcome: Progressing   Problem: Elimination: Goal: Will not experience complications related to bowel motility Outcome: Progressing Goal: Will not experience complications related to urinary retention Outcome: Progressing   Problem: Pain Managment: Goal: General experience of comfort will improve and/or be controlled Outcome: Progressing   Problem: Safety: Goal: Ability to remain free from injury will improve Outcome: Progressing   Problem: Skin Integrity: Goal: Risk for impaired skin integrity will decrease Outcome: Progressing

## 2023-06-25 NOTE — Plan of Care (Signed)
  Problem: Clinical Measurements: Goal: Respiratory complications will improve Outcome: Not Progressing Goal: Cardiovascular complication will be avoided Outcome: Not Progressing   

## 2023-06-25 NOTE — Plan of Care (Signed)

## 2023-06-25 NOTE — Progress Notes (Signed)
 PROGRESS NOTE  Denise [name], is a 73 y.o. female, DOB - 1950-10-24, ZOX:[id number]  Admit date - 06/24/2023   Admitting Physician Onnie Boer, MD  Outpatient Primary MD for the patient is Mirna Mires, MD  LOS - 1  Chief Complaint  Patient presents with   Shortness of Breath      Brief Narrative:  73 y.o. female with extensive medical history significant for left lung adenocarcinoma with brain metastasis, chronic respiratory failure on 2 to 4 L, atrial fibrillation, aortic stenosis, hypertension, pulmonary embolism  and DM2 admitted on 06/24/2023 with acute on chronic hypoxic respiratory failure    -Assessment and Plan: 1)Acute on chronic respiratory failure-- She reports compliance with Xarelto.  - CT chest with contrast shows slight increase in loculated right pleural effusion.   - Recent hospitalization, patient underwent thoracentesis 3/28-with drainage of of pleural fluid, pathology was negative for malignant cells, cultures and gram stain negative -ABG-pH of 7.46, pCO2 of 56. -COVID influenza RSV negative WBC 9.0 >>11.4 -c/n IV ceftriaxone and azithromycin for possible pneumonia -Currently requiring 5 L of oxygen up from 2 L per nasal cannula at baseline  2)Rt Sided Loculated pleural effusion CT with contrast  slight interval increase in loculated right pleural effusion compared to most recent prior exam from March. Stable small loculated left pleural effusion.  Right effusion was drained during recent hospitalization 06/15/23 -Repeat ultrasound-guided thoracentesis on 06/25/2023 with insufficient fluid for thoracentensis -IR consulted input appreciated  3)Atrial fibrillation with RVR ---rate control remains challenging -Echo from 12/25/2022 with EF of 65 to 70%  hold PTA Coreg due to hypotension -Continue IV Cardizem drip for rate control -Increase midodrine to 10 mg 3 times daily for pressure support -Restart Xarelto for stroke prophylaxis  4)DM2-A1c 5.8  reflecting excellent diabetic control PTA Diet controlled. --Monitor fingersticks  5)H/o Pulmonary embolus (HCC) C/n  Xarelto  6)H/o HTN -Currently hypotensive -Increase midodrine to 10 mg 3 times daily for better BP support -Coreg on hold -Currently on IV Cardizem drip as above #3  7)Aortic stenosis Last echo 12/2022-aortic valve appear significantly stenotic.  Per cardiology notes 02/2023, patient not felt to be/may not to be a candidate for TAVR given metastatic lung cancer.  Iron deficiency anemia Hemoglobin stable at 8.7.  Adenocarcinoma of left lung (HCC) Follows with Dr. Ellin Saba.  Last visit 06/07/2023, tolerated last treatment well.  Was referred to Dr. Sherene Sires for dyspnea. -- Her next chemo session is scheduled for 07/02/2023  Status is: Inpatient   Disposition: The patient is from: Home              Anticipated d/c is to: Home              Anticipated d/c date is: 2 days              Patient currently is not medically stable to d/c. Barriers: Not Clinically Stable-   Code Status :  -  Code Status: Full Code   Family Communication:    NA (patient is alert, awake and coherent)   DVT Prophylaxis  :   - SCDs/Xarelto   Place and maintain sequential compression device Start: 06/24/23 2238   Lab Results  Component Value Date   PLT 383 06/25/2023    Inpatient Medications  Scheduled Meds:  Chlorhexidine Gluconate Cloth  6 each Topical Daily   gabapentin  600 mg Oral QHS   midodrine  10 mg Oral TID WC   sodium chloride flush  10-40 mL Intracatheter  Q12H   Continuous Infusions:  diltiazem (CARDIZEM) infusion 2.5 mg/hr (06/25/23 1610)   PRN Meds:.acetaminophen **OR** acetaminophen, albuterol, ALPRAZolam, ondansetron **OR** ondansetron (ZOFRAN) IV, polyethylene glycol, sodium chloride flush   Anti-infectives (From admission, onward)    Start     Dose/Rate Route Frequency Ordered Stop   06/24/23 1930  cefTRIAXone (ROCEPHIN) 1 g in sodium chloride 0.9 % 100 mL  IVPB        1 g 200 mL/hr over 30 Minutes Intravenous  Once 06/24/23 1928 06/24/23 2040   06/24/23 1930  azithromycin (ZITHROMAX) 500 mg in sodium chloride 0.9 % 250 mL IVPB        500 mg 250 mL/hr over 60 Minutes Intravenous  Once 06/24/23 1928 06/24/23 2231       Subjective: Denise [name] today has no fevers, no emesis,  No chest pain,   - Dyspnea persist -Hypoxia persist requiring up to 5 L of oxygen via nasal cannula   Objective: Vitals:   06/25/23 1500 06/25/23 1555 06/25/23 1600 06/25/23 1610  BP: (!) 102/47  (!) 106/44   Pulse: 81  84 84  Resp: (!) 26  (!) 25 (!) 22  Temp:  98.5 F (36.9 C)    TempSrc:  Oral    SpO2: 100%  100% 97%  Weight:      Height:        Intake/Output Summary (Last 24 hours) at 06/25/2023 1707 Last data filed at 06/25/2023 1610 Gross per 24 hour  Intake 483.69 ml  Output --  Net 483.69 ml   Filed Weights   06/24/23 1546 06/24/23 2137  Weight: 92 kg 88 kg    Physical Exam  Gen:- Awake Alert, in no acute distress HEENT:- [place].AT, No sclera icterus Nose- [place] 5L/min Neck-Supple Neck,No JVD,.  Lungs-somewhat diminished on the right, no wheezing  CV- S1, S2 normal, irregularly irregular and tachycardic, 3/6 SM Abd-  +ve B.Sounds, Abd Soft, No tenderness,    Extremity/Skin:- No  edema, pedal pulses present  Psych-affect is appropriate, oriented x3 Neuro-generalized weakness, no new focal deficits, no tremors  Data Reviewed: I have personally reviewed following labs and imaging studies  CBC: Recent Labs  Lab 06/24/23 1604 06/25/23 0416  WBC 9.0 11.4*  HGB 8.7* 9.1*  HCT 30.4* 32.1*  MCV 101.7* 101.6*  PLT 350 383   Basic Metabolic Panel: Recent Labs  Lab 06/24/23 1604 06/25/23 0416  NA 140 143  K 3.6 3.9  CL 94* 96*  CO2 34* 32  GLUCOSE 99 81  BUN 13 10  CREATININE 0.74 0.72  CALCIUM 9.0 9.0   GFR: Estimated Creatinine Clearance: 66.5 mL/min (by C-G formula based on SCr of 0.72 mg/dL). Liver Function Tests: Recent Labs   Lab 06/24/23 1604  AST 15  ALT 11  ALKPHOS 71  BILITOT 0.5  PROT 5.9*  ALBUMIN 2.4*   HbA1C: Recent Labs    06/25/23 0416  HGBA1C 5.8*    Recent Results (from the past 240 hours)  Resp panel by RT-PCR (RSV, Flu A&B, Covid) Anterior Nasal Swab     Status: None   Collection Time: 06/24/23  4:04 PM   Specimen: Anterior Nasal Swab  Result Value Ref Range Status   SARS Coronavirus 2 by RT PCR NEGATIVE NEGATIVE Final    Comment: (NOTE) SARS-CoV-2 target nucleic acids are NOT DETECTED.  The SARS-CoV-2 RNA is generally detectable in upper respiratory specimens during the acute phase of infection. The lowest concentration of SARS-CoV-2 viral copies this assay can detect  is 138 copies/mL. A negative result does not preclude SARS-Cov-2 infection and should not be used as the sole basis for treatment or other patient management decisions. A negative result may occur with  improper specimen collection/handling, submission of specimen other than nasopharyngeal swab, presence of viral mutation(s) within the areas targeted by this assay, and inadequate number of viral copies(<138 copies/mL). A negative result must be combined with clinical observations, patient history, and epidemiological information. The expected result is Negative.  Fact Sheet for Patients:  BloggerCourse.com  Fact Sheet for Healthcare Providers:  SeriousBroker.it  This test is no t yet approved or cleared by the [place] FDA and  has been authorized for detection and/or diagnosis of SARS-CoV-2 by FDA under an Emergency Use Authorization (EUA). This EUA will remain  in effect (meaning this test can be used) for the duration of the COVID-19 declaration under Section 564(b)(1) of the Act, 21 U.S.C.section 360bbb-3(b)(1), unless the authorization is terminated  or revoked sooner.       Influenza A by PCR NEGATIVE NEGATIVE Final   Influenza B by PCR  NEGATIVE NEGATIVE Final    Comment: (NOTE) The Xpert Xpress SARS-CoV-2/FLU/RSV plus assay is intended as an aid in the diagnosis of influenza from Nasopharyngeal swab specimens and should not be used as a sole basis for treatment. Nasal washings and aspirates are unacceptable for Xpert Xpress SARS-CoV-2/FLU/RSV testing.  Fact Sheet for Patients: BloggerCourse.com  Fact Sheet for Healthcare Providers: SeriousBroker.it  This test is not yet approved or cleared by the [place] FDA and has been authorized for detection and/or diagnosis of SARS-CoV-2 by FDA under an Emergency Use Authorization (EUA). This EUA will remain in effect (meaning this test can be used) for the duration of the COVID-19 declaration under Section 564(b)(1) of the Act, 21 U.S.C. section 360bbb-3(b)(1), unless the authorization is terminated or revoked.     Resp Syncytial Virus by PCR NEGATIVE NEGATIVE Final    Comment: (NOTE) Fact Sheet for Patients: BloggerCourse.com  Fact Sheet for Healthcare Providers: SeriousBroker.it  This test is not yet approved or cleared by the [place] FDA and has been authorized for detection and/or diagnosis of SARS-CoV-2 by FDA under an Emergency Use Authorization (EUA). This EUA will remain in effect (meaning this test can be used) for the duration of the COVID-19 declaration under Section 564(b)(1) of the Act, 21 U.S.C. section 360bbb-3(b)(1), unless the authorization is terminated or revoked.  Performed at [facility], [address] [place], [place] [address]     Radiology Studies: [place] CHEST (PLEURAL EFFUSION) Result Date: 06/25/2023 CLINICAL DATA:  Patient with history of metastatic lung cancer, chronic respiratory failure on supplemental oxygen who was admitted yesterday for worsening dyspnea. CT chest was performed and showed small loculated right and left pleural  effusions. Request for thoracentesis. EXAM: CHEST ULTRASOUND COMPARISON:  CT chest w/contrast 06/24/23, US thoracentesis 06/15/23 FINDINGS: Limited ultrasound of the right and left chest was performed. Right chest with very small, loculated pleural effusion. Left chest with no appreciable pleural effusion. Exam limited by body habitus. IMPRESSION: [place] chest significant for: 1. Very small, loculated right pleural effusion which is not amenable to thoracentesis. No procedure was performed. 2.  No appreciable left pleural effusion Electronically Signed   By: Marliss Coots M.D.   On: 06/25/2023 14:17   CT Chest W Contrast Result Date: 06/24/2023 CLINICAL DATA:  History of lung cancer increased short of breath EXAM: CT CHEST WITH CONTRAST TECHNIQUE: Multidetector CT imaging of the chest was  performed during intravenous contrast administration. RADIATION DOSE REDUCTION: This exam was performed according to the departmental dose-optimization program which includes automated exposure control, adjustment of the mA and/or kV according to patient size and/or use of iterative reconstruction technique. CONTRAST:  75mL OMNIPAQUE IOHEXOL 300 MG/ML  SOLN COMPARISON:  Chest x-ray 06/24/2023, CT 06/04/2023, 04/19/2023, 07/17/2021, 03/02/2020, PET CT 12/17/2018 FINDINGS: Cardiovascular: Right-sided central venous port with tip at the distal SVC. Moderate aortic atherosclerosis. No aneurysm or dissection. Cardiomegaly. Small pericardial effusion. Aortic valvular calcification. Mediastinum/Nodes: Patent trachea. No thyroid mass. Subcentimeter mediastinal lymph nodes. Esophagus within normal limits. Lungs/Pleura: Small loculated left pleural effusion without substantial change compared to most recent prior exam from March. Slight interval increase in loculated right pleural effusion compared to most recent prior. Stable post therapeutic distortion and consolidation in the left upper lobe. Otherwise similar pattern of consolidations at the  right middle and bilateral lower lobes compared to most recent prior. Similar pattern of perihilar ground-glass density. Upper Abdomen: Hepatic metastatic lesions at the right hepatic lobe with index lesion measuring about 35 x 40 mm, previously 39 x 37 mm. No acute finding. Postsurgical changes of the stomach consistent with prior gastric bypass surgery Musculoskeletal: Generalized subcutaneous edema. No acute or suspicious osseous abnormality. IMPRESSION: 1. Slight interval increase in loculated right pleural effusion compared to most recent prior exam from March. Stable small loculated left pleural effusion. 2. Stable post therapeutic distortion and consolidation in the left upper lobe. Otherwise similar pattern of consolidations at the right middle and bilateral lower lobes compared to most recent prior. Similar pattern of perihilar ground-glass density. 3. Cardiomegaly with small pericardial effusion. 4. Hepatic metastatic lesions, similar to prior. 5. Generalized subcutaneous edema. 6. Aortic atherosclerosis. Aortic Atherosclerosis (ICD10-I70.0). Electronically Signed   By: Jasmine Pang M.D.   On: 06/24/2023 19:05   DG Chest Port 1 View Result Date: 06/24/2023 CLINICAL DATA:  Increasing shortness of breath. History of lung cancer. EXAM: PORTABLE CHEST 1 VIEW COMPARISON:  Radiograph 06/16/2023, chest CT 06/04/2023 FINDINGS: Right chest port in place. Partially loculated right pleural effusion has increased from prior exam. Left pleural effusion is similar. Peripheral left upper lobe opacity without interval change. No pneumothorax or pulmonary edema. Stable heart size and mediastinal contours. Left shoulder arthroplasty. Advanced right shoulder arthropathy. IMPRESSION: 1. Partially loculated right pleural effusion has increased from prior exam. 2. Left pleural effusion is similar. 3. Peripheral left upper lobe opacity without interval change. Electronically Signed   By: Narda Rutherford M.D.   On: 06/24/2023  16:28   Scheduled Meds:  Chlorhexidine Gluconate Cloth  6 each Topical Daily   gabapentin  600 mg Oral QHS   midodrine  10 mg Oral TID WC   sodium chloride flush  10-40 mL Intracatheter Q12H   Continuous Infusions:  diltiazem (CARDIZEM) infusion 2.5 mg/hr (06/25/23 1610)    LOS: 1 day   Shon Hale M.D on 06/25/2023 at 5:07 PM  Go to [email or web address] - for contact info  [facility] - Office  [phone]  If 7PM-7AM, please contact night-coverage [email or web address] 06/25/2023, 5:07 PM

## 2023-06-25 NOTE — TOC Initial Note (Signed)
 Transition of Care [facility]) - Initial/Assessment Note    Patient Details  Name: Denise [name] MRN: [id number] Date of Birth: 04/06/1950  [facility]) CM/SW Contact:    Karn Cassis, LCSW Phone Number: 06/25/2023, 9:54 AM  Clinical Narrative: Pt admitted with acute on chronic respiratory failure. Assessment completed due to high risk readmission score. Pt reports she lives with her niece. She indicates she is fairly independent with ADLs. Pt is on 2L home O2 ([employer]) and ambulates with a walker. Her brother provides transportation to appointments. Pt plans to return home when medically stable. TOC will follow.                   Expected Discharge Plan: Home/Self Care Barriers to Discharge: Continued Medical Work up   Patient Goals and CMS Choice Patient states their goals for this hospitalization and ongoing recovery are:: return home   Choice offered to / list presented to : Patient [place] ownership interest in [facility].provided to::  (n/a)    Expected Discharge Plan and Services In-house Referral: Clinical Social Work     Living arrangements for the past 2 months: Single Family Home                                      Prior Living Arrangements/Services Living arrangements for the past 2 months: Single Family Home Lives with:: Relatives Patient language and need for interpreter reviewed:: Yes Do you feel safe going back to the place where you live?: Yes      Need for Family Participation in Patient Care: Yes (Comment) Care giver support system in place?: Yes (comment) Current home services: DME (home O2, walker) Criminal Activity/Legal Involvement Pertinent to Current Situation/Hospitalization: No - Comment as needed  Activities of Daily Living   ADL Screening (condition at time of admission) Independently performs ADLs?: Yes (appropriate for developmental age) Is the patient deaf or have difficulty hearing?: No Does the patient  have difficulty seeing, even when wearing glasses/contacts?: No Does the patient have difficulty concentrating, remembering, or making decisions?: No  Permission Sought/Granted                  Emotional Assessment     Affect (typically observed): Appropriate Orientation: : Oriented to Self, Oriented to Place, Oriented to  Time, Oriented to Situation Alcohol / Substance Use: Not Applicable Psych Involvement: No (comment)  Admission diagnosis:  Shortness of breath [R06.02] Atrial fibrillation with rapid ventricular response (HCC) [I48.91] Recurrent right pleural effusion [J90] Acute on chronic respiratory failure (HCC) [J96.20] Acute on chronic respiratory failure with hypoxia (HCC) [J96.21] Primary malignant neoplasm of left lung metastatic to other site [facility]) [C34.92] Patient Active Problem List   Diagnosis Date Noted   Acute on chronic respiratory failure (HCC) 06/24/2023   Hypotension 06/24/2023   Acute on chronic hypoxic respiratory failure (HCC) 06/13/2023   Colitis 01/11/2023   Pneumonia of left lower lobe due to infectious organism 12/28/2022   Acute respiratory failure with hypoxia (HCC) 12/28/2022   Loculated pleural effusion 12/28/2022   Acute on chronic respiratory failure with hypoxia (HCC) 12/24/2022   Pleural effusion, left 12/24/2022   Multifocal pneumonia 12/24/2022   Atrial fibrillation with RVR (HCC) 12/24/2022   Severe sepsis (HCC) 12/24/2022   Iron deficiency anemia 09/02/2022   Early satiety 09/02/2022   Intramuscular hematoma 08/03/2021   Pulmonary embolus (HCC) 07/17/2021   Type  2 diabetes mellitus (HCC)    Essential hypertension    History of lung cancer    Class 3 obesity    Hypokalemia    Malignant neoplasm metastatic to brain (HCC) 06/04/2020   Meningioma, cerebral (HCC) 05/18/2020   Abnormal myocardial perfusion study    Aortic stenosis    DOE (dyspnea on exertion) 10/09/2019   Meningioma (HCC) 03/20/2019   Port-A-Cath in place  01/06/2019   Adenocarcinoma of left lung (HCC) 12/09/2018   S/P shoulder replacement 01/21/2015   Expected blood loss anemia 03/08/2012   Morbid obesity with BMI of 45.0-49.9, adult (HCC) 03/08/2012   S/P right THA, AA 03/07/2012   Small bowel obstruction (HCC) 04/11/2011   Incisional hernia with obstruction 04/11/2011   Pelvic mass 03/29/2011   Radicular low back pain 11/03/2010   Hip arthritis 11/03/2010   PCP:  Mirna Mires, MD Pharmacy:   [id number] - [place], [place] - [address] AT [facility] [address]  [place] [address] Phone: [phone] Fax: [phone]  [facility] [address] [place] [address] Phone: [phone] Fax: [phone]     Social Drivers of Health (SDOH) Social History: SDOH Screenings   Food Insecurity: No Food Insecurity (06/24/2023)  Housing: Low Risk  (06/24/2023)  Transportation Needs: No Transportation Needs (06/24/2023)  Utilities: Not At Risk (06/24/2023)  Financial Resource Strain: Low Risk  (12/06/2018)  Physical Activity: Inactive (12/06/2018)  Social Connections: Unknown (06/24/2023)  Recent Concern: Social Connections - Moderately Isolated (06/13/2023)  Stress: No Stress Concern Present (12/09/2018)   Received from [facility], [facility]  Tobacco Use: Medium Risk (06/24/2023)  Health Literacy: Low Risk  (01/01/2022)   Received from [facility], [facility]   SDOH Interventions:     Readmission Risk Interventions    06/25/2023    9:51 AM 06/15/2023    2:48 PM 01/12/2023    3:51 PM  Readmission Risk Prevention Plan  Transportation Screening Complete Complete Complete  PCP or Specialist Appt within 3-5 Days   Not Complete  HRI or Home Care Consult Complete Complete Complete  Social Work Consult for Recovery Care Planning/Counseling Complete Complete Complete  Palliative Care Screening Not Applicable Not Applicable Not Applicable  Medication Review Furniture conservator/restorer) Complete Complete Complete

## 2023-06-25 NOTE — Progress Notes (Signed)
 Request to IR for thoracentesis. Patient s/p diagnostic and therapeutic right thoracentesis 06/15/23 yielding 550 mL.  Limited US of right and left chest was performed which shows very small, loculated pleural effusion on the right which is not amenable to safe thoracentesis and no pleural fluid on the left.   No procedure performed, discussed [place] with patient at bedside who states understanding - she is actually relieved that she doesn't need to have this done again.  Images available for review under imaging tab of Epic.  Lynnette Caffey, PA-C

## 2023-06-25 NOTE — Progress Notes (Signed)
 Patient's rhythm is sinus on the monitor with a HR of 70-90's. Blood pressure has been running soft on 2.5mg  cardizem with a MAP of 60's. Dr. Marisa Severin made aware that cardizem drip turned off, stated not to get an EKG right now and to monitor patient since her rhythm is sinus. Patient currently sitting up in the chair, watching TV. No concerns from patient at this time. Call bell within reach.

## 2023-06-26 DIAGNOSIS — J9620 Acute and chronic respiratory failure, unspecified whether with hypoxia or hypercapnia: Secondary | ICD-10-CM | POA: Diagnosis not present

## 2023-06-26 DIAGNOSIS — I4891 Unspecified atrial fibrillation: Secondary | ICD-10-CM | POA: Diagnosis not present

## 2023-06-26 DIAGNOSIS — C3492 Malignant neoplasm of unspecified part of left bronchus or lung: Secondary | ICD-10-CM | POA: Diagnosis not present

## 2023-06-26 LAB — CORTISOL-AM, BLOOD: Cortisol - AM: 11.5 ug/dL (ref 6.7–22.6)

## 2023-06-26 MED ORDER — DILTIAZEM HCL ER COATED BEADS 120 MG PO CP24
120.0000 mg | ORAL_CAPSULE | Freq: Every day | ORAL | Status: DC
  Administered 2023-06-26 – 2023-06-27 (×2): 120 mg via ORAL
  Filled 2023-06-26 (×2): qty 1

## 2023-06-26 NOTE — Progress Notes (Signed)
 Patient is very anxious and aggravated this morning. Patient has pulled all her leads and gown off, pulled her port needle access out. Patient states "I am just ready to go home, I want everyone to leave me alone so I can leave". Educated patient that it is still not safe for her to leave right now. Attempted to give patient a xanax for anxiety and patient refused to take it d/t "not wanting to fall asleep". Dr. Marisa Severin made aware.

## 2023-06-26 NOTE — Plan of Care (Signed)

## 2023-06-26 NOTE — Plan of Care (Signed)
  Problem: Education: Goal: Knowledge of General Education information will improve Description: Including pain rating scale, medication(s)/side effects and non-pharmacologic comfort measures Outcome: Progressing   Problem: Health Behavior/Discharge Planning: Goal: Ability to manage health-related needs will improve Outcome: Progressing   Problem: Clinical Measurements: Goal: Ability to maintain clinical measurements within normal limits will improve Outcome: Progressing Goal: Will remain free from infection Outcome: Progressing Goal: Diagnostic test results will improve Outcome: Progressing Goal: Respiratory complications will improve Outcome: Progressing Goal: Cardiovascular complication will be avoided Outcome: Not Progressing   Problem: Activity: Goal: Risk for activity intolerance will decrease Outcome: Progressing   Problem: Nutrition: Goal: Adequate nutrition will be maintained Outcome: Progressing   Problem: Coping: Goal: Level of anxiety will decrease Outcome: Progressing   Problem: Elimination: Goal: Will not experience complications related to bowel motility Outcome: Progressing Goal: Will not experience complications related to urinary retention Outcome: Progressing   Problem: Pain Managment: Goal: General experience of comfort will improve and/or be controlled Outcome: Progressing   Problem: Safety: Goal: Ability to remain free from injury will improve Outcome: Progressing   Problem: Skin Integrity: Goal: Risk for impaired skin integrity will decrease Outcome: Progressing

## 2023-06-26 NOTE — Progress Notes (Signed)
 PROGRESS NOTE  Denise [name], is a 73 y.o. female, DOB - Sep 10, 1950, [id number]  Admit date - 06/24/2023   Admitting Physician Onnie Boer, MD  Outpatient Primary MD for the patient is Denise Mires, MD  LOS - 2  Chief Complaint  Patient presents with   Shortness of Breath      Brief Narrative:  73 y.o. female with extensive medical history significant for left lung adenocarcinoma with brain metastasis, chronic respiratory failure on 2 to 4 L, atrial fibrillation, aortic stenosis, hypertension, pulmonary embolism  and DM2 admitted on 06/24/2023 with acute on chronic hypoxic respiratory failure    -Assessment and Plan: 1)Acute on chronic respiratory failure-- She reports compliance with Xarelto.  - CT chest with contrast shows slight increase in loculated right pleural effusion.   - Recent hospitalization, patient underwent thoracentesis 3/28-with drainage of of pleural fluid, pathology was negative for malignant cells, cultures and gram stain negative -ABG-pH of 7.46, pCO2 of 56. -COVID influenza RSV negative WBC 9.0 >>11.4 -c/n IV ceftriaxone and azithromycin for possible pneumonia -Currently requiring 5 L of oxygen up from 2 L per nasal cannula at baseline -- Repeat chest x-ray on 06/27/2023 -Hypoxia, tachypnea, tachycardia and dyspnea  2)Rt Sided Loculated pleural effusion CT with contrast  slight interval increase in loculated right pleural effusion compared to most recent prior exam from March. Stable small loculated left pleural effusion.  Right effusion was drained during recent hospitalization 06/15/23 -Repeat ultrasound-guided thoracentesis on 06/25/2023 with insufficient fluid for thoracentensis -IR consulted input appreciated --- Repeat chest x-ray on 06/27/2023--- may have enough pleural fluid for palliative thoracentesis to relieve her symptoms  3)Atrial fibrillation with RVR ---rate control remains challenging -Echo from 12/25/2022 with EF of 65 to 70%  hold  PTA Coreg due to hypotension -Continue IV Cardizem drip for rate control -Increased midodrine to 10 mg 3 times daily for pressure support -c/n  Xarelto for stroke prophylaxis  4)DM2-A1c 5.8 reflecting excellent diabetic control PTA Diet controlled. --Monitor fingersticks  5)H/o Pulmonary embolus (HCC) C/n  Xarelto  6)H/o HTN -Currently hypotensive---a.m. cortisol is 11.5 -Increased midodrine to 10 mg 3 times daily for better BP support -Coreg on hold -Currently on IV Cardizem drip as above #3  7)Aortic stenosis Last echo 12/2022-aortic valve appear significantly stenotic.  Per cardiology notes 02/2023, patient not felt to be/may not to be a candidate for TAVR given metastatic lung cancer.  Iron deficiency anemia Hemoglobin stable at 8.7.  Adenocarcinoma of left lung (HCC) Follows with Dr. Ellin Saba.  Last visit 06/07/2023, tolerated last treatment well.  Was referred to Dr. Sherene Sires for dyspnea. -- Her next chemo session is scheduled for 07/02/2023  Generalized weakness and ambulatory dysfunction--- patient family members are worried that she may be too weak to return home -- Official physical therapy eval requested  Status is: Inpatient   Disposition: The patient is from: Home              Anticipated d/c is to: Home              Anticipated d/c date is: 1 day              Patient currently is not medically stable to d/c. Barriers: Not Clinically Stable-   Code Status :  -  Code Status: Full Code   Family Communication:    NA (patient is alert, awake and coherent)   DVT Prophylaxis  :   - SCDs/Xarelto   Place and maintain sequential compression device  Start: 06/24/23 2238 rivaroxaban (XARELTO) tablet 20 mg   Lab Results  Component Value Date   PLT 383 06/25/2023    Inpatient Medications  Scheduled Meds:  Chlorhexidine Gluconate Cloth  6 each Topical Daily   diltiazem  120 mg Oral Daily   feeding supplement  237 mL Oral BID BM   gabapentin  600 mg Oral QHS    midodrine  10 mg Oral TID WC   rivaroxaban  20 mg Oral Q supper   sodium chloride flush  10-40 mL Intracatheter Q12H   Continuous Infusions:  azithromycin Stopped (06/25/23 2205)   cefTRIAXone (ROCEPHIN)  IV 1 g (06/26/23 1943)   PRN Meds:.acetaminophen **OR** acetaminophen, albuterol, ALPRAZolam, ondansetron **OR** ondansetron (ZOFRAN) IV, mouth rinse, polyethylene glycol, sodium chloride flush   Anti-infectives (From admission, onward)    Start     Dose/Rate Route Frequency Ordered Stop   06/25/23 2200  azithromycin (ZITHROMAX) 500 mg in sodium chloride 0.9 % 250 mL IVPB        500 mg 250 mL/hr over 60 Minutes Intravenous Every 24 hours 06/25/23 1714     06/25/23 2000  cefTRIAXone (ROCEPHIN) 1 g in sodium chloride 0.9 % 100 mL IVPB        1 g 200 mL/hr over 30 Minutes Intravenous Every 24 hours 06/25/23 1714     06/24/23 1930  cefTRIAXone (ROCEPHIN) 1 g in sodium chloride 0.9 % 100 mL IVPB        1 g 200 mL/hr over 30 Minutes Intravenous  Once 06/24/23 1928 06/24/23 2040   06/24/23 1930  azithromycin (ZITHROMAX) 500 mg in sodium chloride 0.9 % 250 mL IVPB        500 mg 250 mL/hr over 60 Minutes Intravenous  Once 06/24/23 1928 06/24/23 2231       Subjective: Denise [name] today has no fevers, no emesis,  No chest pain,   - Patient with generalized weakness and deconditioning-- -- -Hypoxia, tachypnea, tachycardia and dyspnea--- -patient had episode of anxiety/panic attack earlier as well--xanax helped  Objective: Vitals:   06/26/23 1700 06/26/23 1815 06/26/23 1900 06/26/23 2000  BP: (!) 94/55 (!) 96/55  (!) 98/51  Pulse:    82  Resp: (!) 27 (!) 23  (!) 37  Temp:   98.4 F (36.9 C)   TempSrc:   Oral   SpO2:  95%  97%  Weight:      Height:        Intake/Output Summary (Last 24 hours) at 06/26/2023 2039 Last data filed at 06/26/2023 2024 Gross per 24 hour  Intake 399.84 ml  Output 450 ml  Net -50.16 ml   Filed Weights   06/24/23 1546 06/24/23 2137  Weight: 92 kg 88  kg    Physical Exam  Gen:- Awake Alert, in no acute distress HEENT:- [place].AT, No sclera icterus Nose- [place] 5L/min Neck-Supple Neck,No JVD,.  Lungs-somewhat diminished on the right, no wheezing  CV- S1, S2 normal, irregularly irregular and tachycardic, 3/6 SM Abd-  +ve B.Sounds, Abd Soft, No tenderness,    Extremity/Skin:- No  edema, pedal pulses present  Psych-affect is appropriate, oriented x3 Neuro-generalized weakness, no new focal deficits, no tremors  Data Reviewed: I have personally reviewed following labs and imaging studies  CBC: Recent Labs  Lab 06/24/23 1604 06/25/23 0416  WBC 9.0 11.4*  HGB 8.7* 9.1*  HCT 30.4* 32.1*  MCV 101.7* 101.6*  PLT 350 383   Basic Metabolic Panel: Recent Labs  Lab 06/24/23 1604 06/25/23 0416  NA 140 143  K 3.6 3.9  CL 94* 96*  CO2 34* 32  GLUCOSE 99 81  BUN 13 10  CREATININE 0.74 0.72  CALCIUM 9.0 9.0   GFR: Estimated Creatinine Clearance: 66.5 mL/min (by C-G formula based on SCr of 0.72 mg/dL). Liver Function Tests: Recent Labs  Lab 06/24/23 1604  AST 15  ALT 11  ALKPHOS 71  BILITOT 0.5  PROT 5.9*  ALBUMIN 2.4*   HbA1C: Recent Labs    06/25/23 0416  HGBA1C 5.8*    Recent Results (from the past 240 hours)  Resp panel by RT-PCR (RSV, Flu A&B, Covid) Anterior Nasal Swab     Status: None   Collection Time: 06/24/23  4:04 PM   Specimen: Anterior Nasal Swab  Result Value Ref Range Status   SARS Coronavirus 2 by RT PCR NEGATIVE NEGATIVE Final    Comment: (NOTE) SARS-CoV-2 target nucleic acids are NOT DETECTED.  The SARS-CoV-2 RNA is generally detectable in upper respiratory specimens during the acute phase of infection. The lowest concentration of SARS-CoV-2 viral copies this assay can detect is 138 copies/mL. A negative result does not preclude SARS-Cov-2 infection and should not be used as the sole basis for treatment or other patient management decisions. A negative result may occur with  improper specimen  collection/handling, submission of specimen other than nasopharyngeal swab, presence of viral mutation(s) within the areas targeted by this assay, and inadequate number of viral copies(<138 copies/mL). A negative result must be combined with clinical observations, patient history, and epidemiological information. The expected result is Negative.  Fact Sheet for Patients:  BloggerCourse.com  Fact Sheet for Healthcare Providers:  SeriousBroker.it  This test is no t yet approved or cleared by the [place] FDA and  has been authorized for detection and/or diagnosis of SARS-CoV-2 by FDA under an Emergency Use Authorization (EUA). This EUA will remain  in effect (meaning this test can be used) for the duration of the COVID-19 declaration under Section 564(b)(1) of the Act, 21 U.S.C.section 360bbb-3(b)(1), unless the authorization is terminated  or revoked sooner.       Influenza A by PCR NEGATIVE NEGATIVE Final   Influenza B by PCR NEGATIVE NEGATIVE Final    Comment: (NOTE) The Xpert Xpress SARS-CoV-2/FLU/RSV plus assay is intended as an aid in the diagnosis of influenza from Nasopharyngeal swab specimens and should not be used as a sole basis for treatment. Nasal washings and aspirates are unacceptable for Xpert Xpress SARS-CoV-2/FLU/RSV testing.  Fact Sheet for Patients: BloggerCourse.com  Fact Sheet for Healthcare Providers: SeriousBroker.it  This test is not yet approved or cleared by the [place] FDA and has been authorized for detection and/or diagnosis of SARS-CoV-2 by FDA under an Emergency Use Authorization (EUA). This EUA will remain in effect (meaning this test can be used) for the duration of the COVID-19 declaration under Section 564(b)(1) of the Act, 21 U.S.C. section 360bbb-3(b)(1), unless the authorization is terminated or revoked.     Resp Syncytial  Virus by PCR NEGATIVE NEGATIVE Final    Comment: (NOTE) Fact Sheet for Patients: BloggerCourse.com  Fact Sheet for Healthcare Providers: SeriousBroker.it  This test is not yet approved or cleared by the [place] FDA and has been authorized for detection and/or diagnosis of SARS-CoV-2 by FDA under an Emergency Use Authorization (EUA). This EUA will remain in effect (meaning this test can be used) for the duration of the COVID-19 declaration under Section 564(b)(1) of the Act, 21 U.S.C. section 360bbb-3(b)(1), unless the authorization  is terminated or revoked.  Performed at [facility], [address], [place], [place] [address]     Radiology Studies: [place] CHEST (PLEURAL EFFUSION) Result Date: 06/25/2023 CLINICAL DATA:  Patient with history of metastatic lung cancer, chronic respiratory failure on supplemental oxygen who was admitted yesterday for worsening dyspnea. CT chest was performed and showed small loculated right and left pleural effusions. Request for thoracentesis. EXAM: CHEST ULTRASOUND COMPARISON:  CT chest w/contrast 06/24/23, US thoracentesis 06/15/23 FINDINGS: Limited ultrasound of the right and left chest was performed. Right chest with very small, loculated pleural effusion. Left chest with no appreciable pleural effusion. Exam limited by body habitus. IMPRESSION: [place] chest significant for: 1. Very small, loculated right pleural effusion which is not amenable to thoracentesis. No procedure was performed. 2.  No appreciable left pleural effusion Electronically Signed   By: Marliss Coots M.D.   On: 06/25/2023 14:17   Scheduled Meds:  Chlorhexidine Gluconate Cloth  6 each Topical Daily   diltiazem  120 mg Oral Daily   feeding supplement  237 mL Oral BID BM   gabapentin  600 mg Oral QHS   midodrine  10 mg Oral TID WC   rivaroxaban  20 mg Oral Q supper   sodium chloride flush  10-40 mL Intracatheter Q12H   Continuous  Infusions:  azithromycin Stopped (06/25/23 2205)   cefTRIAXone (ROCEPHIN)  IV 1 g (06/26/23 1943)    LOS: 2 days   Shon Hale M.D on 06/26/2023 at 8:39 PM  Go to [email or web address] - for contact info  [facility] - Office  [phone]  If 7PM-7AM, please contact night-coverage [email or web address] 06/26/2023, 8:39 PM

## 2023-06-27 ENCOUNTER — Inpatient Hospital Stay (HOSPITAL_COMMUNITY)

## 2023-06-27 DIAGNOSIS — C3492 Malignant neoplasm of unspecified part of left bronchus or lung: Secondary | ICD-10-CM | POA: Diagnosis not present

## 2023-06-27 DIAGNOSIS — Z95828 Presence of other vascular implants and grafts: Secondary | ICD-10-CM

## 2023-06-27 DIAGNOSIS — I9589 Other hypotension: Secondary | ICD-10-CM

## 2023-06-27 DIAGNOSIS — J9621 Acute and chronic respiratory failure with hypoxia: Secondary | ICD-10-CM | POA: Diagnosis not present

## 2023-06-27 DIAGNOSIS — I2609 Other pulmonary embolism with acute cor pulmonale: Secondary | ICD-10-CM

## 2023-06-27 LAB — RENAL FUNCTION PANEL
Albumin: 2.1 g/dL — ABNORMAL LOW (ref 3.5–5.0)
Anion gap: 8 (ref 5–15)
BUN: 9 mg/dL (ref 8–23)
CO2: 35 mmol/L — ABNORMAL HIGH (ref 22–32)
Calcium: 8.7 mg/dL — ABNORMAL LOW (ref 8.9–10.3)
Chloride: 98 mmol/L (ref 98–111)
Creatinine, Ser: 0.7 mg/dL (ref 0.44–1.00)
GFR, Estimated: >60 mL/min (ref >60)
Glucose, Bld: 91 mg/dL (ref 70–99)
Phosphorus: 3.6 mg/dL (ref 2.5–4.6)
Potassium: 3.7 mmol/L (ref 3.5–5.1)
Sodium: 141 mmol/L (ref 135–145)

## 2023-06-27 LAB — CBC
HCT: 30.3 % — ABNORMAL LOW (ref 36.0–46.0)
Hemoglobin: 8.8 g/dL — ABNORMAL LOW (ref 12.0–15.0)
MCH: 29.3 pg (ref 26.0–34.0)
MCHC: 29 g/dL — ABNORMAL LOW (ref 30.0–36.0)
MCV: 101 fL — ABNORMAL HIGH (ref 80.0–100.0)
Platelets: 355 10*3/uL (ref 150–400)
RBC: 3 MIL/uL — ABNORMAL LOW (ref 3.87–5.11)
RDW: 16.8 % — ABNORMAL HIGH (ref 11.5–15.5)
WBC: 7.5 10*3/uL (ref 4.0–10.5)
nRBC: 0 % (ref 0.0–0.2)

## 2023-06-27 MED ORDER — AMOXICILLIN-POT CLAVULANATE 875-125 MG PO TABS: 1.0000 | ORAL_TABLET | Freq: Two times a day (BID) | ORAL | 0 refills | Status: AC

## 2023-06-27 MED ORDER — DILTIAZEM HCL ER COATED BEADS 120 MG PO CP24: 120.0000 mg | ORAL_CAPSULE | Freq: Every day | ORAL | 1 refills | Status: DC

## 2023-06-27 MED ORDER — MIDODRINE HCL 10 MG PO TABS: 10.0000 mg | ORAL_TABLET | Freq: Three times a day (TID) | ORAL | 2 refills | Status: DC

## 2023-06-27 MED ORDER — MUPIROCIN 2 % EX OINT
TOPICAL_OINTMENT | Freq: Two times a day (BID) | CUTANEOUS | Status: DC
Start: 2023-06-27 — End: 2023-06-27
  Filled 2023-06-27: qty 22

## 2023-06-27 NOTE — Discharge Summary (Signed)
 Physician Discharge Summary   Patient: Denise [name] MRN: [id number] DOB: 1950/05/24  Admit date:     06/24/2023  Discharge date: 06/27/23  Discharge Physician: Vassie Loll   PCP: Mirna Mires, MD   Recommendations at discharge:  Repeat basic metabolic panel to follow ultralights and renal function Repeat CBC to follow hemoglobin trend/stability Repeat chest x-ray in 4 weeks to assure resolution of infiltrates and follow-up on pleural effusion reaccumulation.  Discharge Diagnoses: Principal Problem:   Acute on chronic respiratory failure (HCC) Active Problems:   Atrial fibrillation with RVR (HCC)   Loculated pleural effusion   Port-A-Cath in place   Pulmonary embolus (HCC)   Type 2 diabetes mellitus (HCC)   Essential hypertension   Aortic stenosis   Adenocarcinoma of left lung (HCC)   Iron deficiency anemia   Hypotension  Brief Hospital admission narrative: As per H&P written by Dr. Mariea Clonts on 06/24/2023 Denise [name] is a 73 y.o. female with extensive medical history significant for left lung adenocarcinoma with brain metastasis, chronic respiratory failure on 2 to 4 L, atrial fibrillation, aortic stenosis, hypertension, pulmonary embolism diabetes mellitus. Recent hospitalization 3/26 to 3/29 for acute hypoxic respiratory failure secondary to right pleural effusion, atrial fibrillation with RVR.  She underwent thoracentesis 3/28 with drainage of 550 mL of dark red colored pleural fluid.  Pathology results-no malignant cells identified, cultures and Gram stain were negative.   She presents today with recurrence of her difficulty breathing over the past 2 days.  She denies swelling to her lower extremities.  She reports compliance with her Lasix and Xarelto.  No chest pain.  No cough.  No fevers no chills.  She denies palpitations, no chest pain.   ED Course: Temperature 98.9.  Heart rate 93-50.  Respiratory rate 24-31.  Blood pressure systolic down to 95/62 improved to 143/116.  O2 sats  down to 86% on home 4 L, on 5 L sats > 94%. WBC 9.  BNP 406.  Troponin 19.  COVID influenza RSV negative. EDP reports rhonchi on exam. CT chest with contrast showed slight interval increase in loculated right pleural effusion compared to most recent prior exam.  Able small loculated left pleural effusion.  Also shows similar pattern of consolidations left upper lobe, right middle and bilateral lower lobes compared to recent.  Similar perihilar groundglass density.  Assessment and Plan: 1)Acute on chronic respiratory failure-- She reports compliance with Xarelto.  - CT chest with contrast shows slight increase in loculated right pleural effusion.   - Recent hospitalization, patient underwent thoracentesis 3/28-with drainage of of pleural fluid, pathology was negative for malignant cells, cultures and gram stain negative -ABG-pH of 7.46, pCO2 of 56. -COVID influenza RSV negative WBC 9.0 >>11.4 -Patient antibiotics transition to oral route and planning to pursue 5 more days at discharge -Continue as needed bronchodilator management and oxygen supplementation -Afebrile and feeling ready to go home.   2)Rt Sided Loculated pleural effusion CT with contrast  slight interval increase in loculated right pleural effusion compared to most recent prior exam from March. Stable small loculated left pleural effusion.  Right effusion was drained during recent hospitalization 06/15/23 -Repeat ultrasound-guided thoracentesis on 06/25/2023 with insufficient fluid for thoracentensis -IR consulted input appreciated; not enough fluid for thoracentesis at this time.   3)Atrial fibrillation with RVR ---rate control remains challenging -Echo from 12/25/2022 with EF of 65 to 70%  -Continue treatment with extended release Cardizem and the use of Xarelto -Coreg has been discontinued due to ongoing hypotension.  4)DM2-A1c 5.8 reflecting excellent diabetic control PTA Diet controlled and outpatient. --Monitor  fingersticks   5)H/o Pulmonary embolus (HCC) -Will continue treatment with Xarelto   6)H/o HTN/hypotension -Currently hypotensive---a.m. cortisol is 11.5 -Continue extended release Cardizem -Continue treatment with midodrine 10 mg by mouth 3 times a day -Continue to follow blood pressure.   7)Aortic stenosis -Last echo 12/2022-aortic valve appear significantly stenotic.  Per cardiology notes 02/2023, patient not felt to be a candidate for TAVR given metastatic lung cancer. -Continue patient follow-up with cardiology service.   Iron deficiency anemia -Hemoglobin stable at 8.7. -Continue to follow hemoglobin trend. -No transfusion required throughout hospitalization.   Adenocarcinoma of left lung [facility]) Continue outpatient follow-up with Dr. Ellin Saba.  Last visit 06/07/2023, tolerated last treatment well.   -Her next chemo session is scheduled for 07/02/2023 -Patient also scheduled to follow-up as an outpatient with pulmonology service.   Generalized weakness and ambulatory dysfunction--- patient family members are worried that she may be too weak to return home; patient has declined skilled nursing facility placement. -In agreement to go home with home health services (home health RN, PT and aide has been arranged).  Class II obesity -Body mass index is 35.12 kg/m.  -Low-calorie diet, portion control and increasing activity discussed with patient.   Consultants: IR Procedures performed: See below for x-ray reports. Disposition: Home with home health services. Diet recommendation: Heart healthy/modified carbohydrate diet.  DISCHARGE MEDICATION: Allergies as of 06/27/2023   No Known Allergies      Medication List     STOP taking these medications    carvedilol 3.125 MG tablet Commonly known as: COREG   ciprofloxacin 0.3 % ophthalmic solution Commonly known as: Ciloxan   Potassium Chloride ER 20 MEQ Tbcr       TAKE these medications    acetaminophen 500 MG  tablet Commonly known as: TYLENOL Take 500 mg by mouth every 6 (six) hours as needed for moderate pain.   albuterol 108 (90 Base) MCG/ACT inhaler Commonly known as: VENTOLIN HFA Inhale 2 puffs into the lungs every 6 (six) hours as needed for wheezing or shortness of breath.   albuterol (2.5 MG/3ML) 0.083% nebulizer solution Commonly known as: PROVENTIL Take 3 mLs (2.5 mg total) by nebulization every 6 (six) hours as needed for wheezing or shortness of breath.   amoxicillin-clavulanate 875-125 MG tablet Commonly known as: AUGMENTIN Take 1 tablet by mouth 2 (two) times daily for 5 days.   diltiazem 120 MG 24 hr capsule Commonly known as: CARDIZEM CD Take 1 capsule (120 mg total) by mouth daily. Start taking on: June 28, 2023   DRY EYE RELIEF DROPS OP Apply 1 drop to eye daily as needed (dry eyes).   ferrous sulfate 325 (65 FE) MG tablet Take 325 mg by mouth every evening.   folic acid 1 MG tablet Commonly known as: FOLVITE TAKE 1 TABLET BY MOUTH EVERY DAY   gabapentin 600 MG tablet Commonly known as: NEURONTIN Take 600 mg by mouth at bedtime.   loteprednol 0.5 % ophthalmic suspension Commonly known as: LOTEMAX Place 1 drop into both eyes 2 (two) times daily.   magnesium oxide 400 MG tablet Commonly known as: MAG-OX Take 1 tablet by mouth 2 (two) times daily.   midodrine 10 MG tablet Commonly known as: PROAMATINE Take 1 tablet (10 mg total) by mouth 3 (three) times daily with meals. What changed:  medication strength how much to take   multivitamin with minerals Tabs tablet Take 1 tablet by mouth  daily.   omeprazole 20 MG capsule Commonly known as: PRILOSEC Take 20 mg by mouth daily.   rivaroxaban 20 MG Tabs tablet Commonly known as: XARELTO Take 1 tablet (20 mg total) by mouth daily with supper.   TUMS PO Take 2-3 tablets by mouth daily.   Vitamin B-12 2500 MCG Subl Place 2,500 mcg under the tongue every morning.        Follow-up Information      Mirna Mires, MD. Schedule an appointment as soon as possible for a visit in 10 day(s).   Specialty: Family Medicine Contact information: [address] [place] [address] [phone]         Care, [facility] Follow up.   Specialty: Home Health Services Why: Will contact you to schedule home health visits. Contact information: [address] [place] [address] [phone]                Discharge Exam: Filed Weights   06/24/23 1546 06/24/23 2137 06/27/23 0552  Weight: 92 kg 88 kg 87.1 kg   General exam: Alert, awake, oriented x 3; in no acute distress and feeling ready to go home. Respiratory system: Improved air movement bilaterally; no wheezing, positive scattered rhonchi.  No using accessory muscle.  Good saturation on 3 L supplementation. Cardiovascular system: Rate controlled, no gallops, no rubs Gastrointestinal system: Abdomen is obese, nondistended, soft and nontender. No organomegaly or masses felt. Normal bowel sounds heard. Central nervous system: Generally weak.  No focal neurological deficits. Extremities: No cyanosis or clubbing. Skin: No petechiae. Psychiatry: Judgement and insight appear normal. Mood & affect appropriate.    Condition at discharge: Stable and improved.  The results of significant diagnostics from this hospitalization (including imaging, microbiology, ancillary and laboratory) are listed below for reference.   Imaging Studies: DG CHEST PORT 1 VIEW Result Date: 06/27/2023 CLINICAL DATA:  Dyspnea.  History of lung cancer. EXAM: PORTABLE CHEST 1 VIEW COMPARISON:  June 24, 2023. FINDINGS: Stable cardiomegaly. Status post left shoulder arthroplasty. Right internal jugular Port-A-Cath is unchanged. Stable loculated right pleural effusion is noted with associated right basilar atelectasis or infiltrate. Continued left upper lobe opacity is noted with pleural based solid component concerning for malignancy given the  history of lung cancer. Small left pleural effusion is also noted with associated atelectasis or infiltrate. IMPRESSION: Continued left upper lobe opacity is noted concerning for possible malignancy. Bilateral pleural effusions are again noted with associated atelectasis or infiltrate. Electronically Signed   By: Lupita Raider M.D.   On: 06/27/2023 12:06   [place] CHEST (PLEURAL EFFUSION) Result Date: 06/25/2023 CLINICAL DATA:  Patient with history of metastatic lung cancer, chronic respiratory failure on supplemental oxygen who was admitted yesterday for worsening dyspnea. CT chest was performed and showed small loculated right and left pleural effusions. Request for thoracentesis. EXAM: CHEST ULTRASOUND COMPARISON:  CT chest w/contrast 06/24/23, US thoracentesis 06/15/23 FINDINGS: Limited ultrasound of the right and left chest was performed. Right chest with very small, loculated pleural effusion. Left chest with no appreciable pleural effusion. Exam limited by body habitus. IMPRESSION: [place] chest significant for: 1. Very small, loculated right pleural effusion which is not amenable to thoracentesis. No procedure was performed. 2.  No appreciable left pleural effusion Electronically Signed   By: Marliss Coots M.D.   On: 06/25/2023 14:17   CT Chest W Contrast Result Date: 06/24/2023 CLINICAL DATA:  History of lung cancer increased short of breath EXAM: CT CHEST WITH  CONTRAST TECHNIQUE: Multidetector CT imaging of the chest was performed during intravenous contrast administration. RADIATION DOSE REDUCTION: This exam was performed according to the departmental dose-optimization program which includes automated exposure control, adjustment of the mA and/or kV according to patient size and/or use of iterative reconstruction technique. CONTRAST:  75mL OMNIPAQUE IOHEXOL 300 MG/ML  SOLN COMPARISON:  Chest x-ray 06/24/2023, CT 06/04/2023, 04/19/2023, 07/17/2021, 03/02/2020, PET CT 12/17/2018 FINDINGS: Cardiovascular:  Right-sided central venous port with tip at the distal SVC. Moderate aortic atherosclerosis. No aneurysm or dissection. Cardiomegaly. Small pericardial effusion. Aortic valvular calcification. Mediastinum/Nodes: Patent trachea. No thyroid mass. Subcentimeter mediastinal lymph nodes. Esophagus within normal limits. Lungs/Pleura: Small loculated left pleural effusion without substantial change compared to most recent prior exam from March. Slight interval increase in loculated right pleural effusion compared to most recent prior. Stable post therapeutic distortion and consolidation in the left upper lobe. Otherwise similar pattern of consolidations at the right middle and bilateral lower lobes compared to most recent prior. Similar pattern of perihilar ground-glass density. Upper Abdomen: Hepatic metastatic lesions at the right hepatic lobe with index lesion measuring about 35 x 40 mm, previously 39 x 37 mm. No acute finding. Postsurgical changes of the stomach consistent with prior gastric bypass surgery Musculoskeletal: Generalized subcutaneous edema. No acute or suspicious osseous abnormality. IMPRESSION: 1. Slight interval increase in loculated right pleural effusion compared to most recent prior exam from March. Stable small loculated left pleural effusion. 2. Stable post therapeutic distortion and consolidation in the left upper lobe. Otherwise similar pattern of consolidations at the right middle and bilateral lower lobes compared to most recent prior. Similar pattern of perihilar ground-glass density. 3. Cardiomegaly with small pericardial effusion. 4. Hepatic metastatic lesions, similar to prior. 5. Generalized subcutaneous edema. 6. Aortic atherosclerosis. Aortic Atherosclerosis (ICD10-I70.0). Electronically Signed   By: Jasmine Pang M.D.   On: 06/24/2023 19:05   DG Chest Port 1 View Result Date: 06/24/2023 CLINICAL DATA:  Increasing shortness of breath. History of lung cancer. EXAM: PORTABLE CHEST 1 VIEW  COMPARISON:  Radiograph 06/16/2023, chest CT 06/04/2023 FINDINGS: Right chest port in place. Partially loculated right pleural effusion has increased from prior exam. Left pleural effusion is similar. Peripheral left upper lobe opacity without interval change. No pneumothorax or pulmonary edema. Stable heart size and mediastinal contours. Left shoulder arthroplasty. Advanced right shoulder arthropathy. IMPRESSION: 1. Partially loculated right pleural effusion has increased from prior exam. 2. Left pleural effusion is similar. 3. Peripheral left upper lobe opacity without interval change. Electronically Signed   By: Narda Rutherford M.D.   On: 06/24/2023 16:28   DG CHEST PORT 1 VIEW Result Date: 06/16/2023 CLINICAL DATA:  Chest pain.  Thoracentesis yesterday. EXAM: PORTABLE CHEST 1 VIEW COMPARISON:  Radiographs 06/15/2023 and 06/13/2023. Chest CT 06/04/2023. FINDINGS: 0705 hours. Right IJ Port-A-Cath extends to the mid SVC level. The heart size and mediastinal contours are stable with cardiomegaly and aortic atherosclerosis. Residual small right pleural effusion is unchanged. There is improved aeration of the right lung base. Chronic findings in the left hemithorax related to prior radiation therapy are unchanged. No evidence of pneumothorax. No acute osseous findings are seen. IMPRESSION: 1. Improved aeration of the right lung base following thoracentesis. No evidence of pneumothorax. 2. Stable cardiomegaly and chronic findings in the left hemithorax. Electronically Signed   By: Carey Bullocks M.D.   On: 06/16/2023 10:59   US THORACENTESIS ASP PLEURAL SPACE W/IMG GUIDE Result Date: 06/15/2023 INDICATION: Patient with a history of lung cancer and heart  failure presents today with a right pleural effusion. Interventional radiology asked to perform a diagnostic and therapeutic thoracentesis. EXAM: ULTRASOUND GUIDED THORACENTESIS MEDICATIONS: 1% lidocaine 10 mL COMPLICATIONS: None immediate. PROCEDURE: An  ultrasound guided thoracentesis was thoroughly discussed with the patient and questions answered. The benefits, risks, alternatives and complications were also discussed. The patient understands and wishes to proceed with the procedure. Written consent was obtained. Ultrasound was performed to localize and mark an adequate pocket of fluid in the right chest. The area was then prepped and draped in the normal sterile fashion. 1% Lidocaine was used for local anesthesia. Under ultrasound guidance a 6 Fr Safe-T-Centesis catheter was introduced. Thoracentesis was performed. The catheter was removed and a dressing applied. FINDINGS: A total of approximately 550 mL of blood-tinged fluid was removed. Samples were sent to the laboratory as requested by the clinical team. IMPRESSION: Successful ultrasound guided right thoracentesis yielding 550 mL of pleural fluid. Procedure performed by Alwyn Ren, NP Electronically Signed   By: Marliss Coots M.D.   On: 06/15/2023 16:34   DG Chest 1 View Result Date: 06/15/2023 CLINICAL DATA:  Status post thoracentesis. EXAM: CHEST  1 VIEW COMPARISON:  Chest x-ray 06/13/2023 FINDINGS: Right chest port catheter tip projects over the mid SVC. The heart is enlarged, unchanged. There central pulmonary vascular congestion. There are patchy opacities in the lung bases. Small left pleural effusion is stable. Right pleural effusion has decreased. There is no pneumothorax. Left shoulder arthroplasty again noted. There severe degenerative changes of the right shoulder. IMPRESSION: 1. No pneumothorax. 2. Decreased right pleural effusion. Stable small left pleural effusion. 3. Stable cardiomegaly and central pulmonary vascular congestion. 4. Patchy opacities in the lung bases may represent atelectasis or infection. Electronically Signed   By: Darliss Cheney M.D.   On: 06/15/2023 16:14   DG Chest Port 1 View Result Date: 06/13/2023 CLINICAL DATA:  Shortness of breath and chest pain. History of  lung cancer. EXAM: PORTABLE CHEST 1 VIEW COMPARISON:  April 16, 2023. FINDINGS: Stable cardiomegaly. Right internal jugular Port-A-Cath is unchanged. Status post left shoulder arthroplasty. Increased bibasilar atelectasis is noted with associated pleural effusions continued left upper lobe airspace opacity is noted concerning for pneumonia. IMPRESSION: Increased bibasilar atelectasis is noted with associated pleural effusions. Stable left upper lobe opacity is noted concerning for pneumonia or atelectasis. Electronically Signed   By: Lupita Raider M.D.   On: 06/13/2023 16:43   CT CHEST ABDOMEN PELVIS W CONTRAST Result Date: 06/07/2023 CLINICAL DATA:  Monitoring adenocarcinoma of left lung. * Tracking Code: BO * EXAM: CT CHEST, ABDOMEN, AND PELVIS WITH CONTRAST TECHNIQUE: Multidetector CT imaging of the chest, abdomen and pelvis was performed following the standard protocol during bolus administration of intravenous contrast. RADIATION DOSE REDUCTION: This exam was performed according to the departmental dose-optimization program which includes automated exposure control, adjustment of the mA and/or kV according to patient size and/or use of iterative reconstruction technique. CONTRAST:  OMNIPAQUE IOHEXOL 300 MG/ML  SOLN COMPARISON:  CT angiogram 04/19/2023. Standard CT chest 02/20/2023. Abdomen pelvis 01/11/2023. FINDINGS: CT CHEST FINDINGS Cardiovascular: Right IJ chest port. Port is accessed. Tip seen along the SVC right atrial junction. Heart is slightly large. Mild-to-moderate pericardial effusion is again seen. Prominent calcifications along the aortic valve and along the coronary arteries. Mild calcified plaque along the course of the thoracic aorta. Mediastinum/Nodes: Slightly patulous thoracic esophagus. Preserved thyroid gland. No specific abnormal lymph node enlargement identified in the axillary regions, hilum or mediastinum. Portions of the  left upper chest are obscured by the extreme streak  artifact from the left hip arthroplasty. Lungs/Pleura: Bilateral pleural effusions are seen, small left and moderate right. The right side has some loculated components as seen previous. Additional lungs demonstrates some scattered ground-glass in a perihilar distribution on the right as well as some bronchial wall thickening and more confluent opacity at the right lower lobe and middle lobe. Consolidative area with air bronchograms in the posterior left upper lobe are also again seen. Some perihilar areas left lower lobe. The amount opacity seen at the left upper lung is appears slightly decreased from previous. No new dominant lung mass. Musculoskeletal: Streak artifact related to the patient's left shoulder arthroplasty. Advanced degenerative changes of the right shoulder. Scattered degenerative changes along the spine with some curvature. CT ABDOMEN PELVIS FINDINGS Hepatobiliary: Previous cholecystectomy. Patent portal vein. Benign-appearing scattered hepatic cystic foci identified such as left hepatic lobe series 2, image 54, unchanged. Heterogeneous lesions in the right hepatic lobe are again noted. The lesion which previously measured 3.5 x 4.1 cm, today on series 2, image 56 measures 3.9 x 3.7 cm with central area of necrosis. Lesions caudal this appears similar as well but the margins are less well defined. Pancreas: Unremarkable. No pancreatic ductal dilatation or surrounding inflammatory changes. Spleen: Normal in size without focal abnormality. Adrenals/Urinary Tract: Stable thickening of the left adrenal gland. Right adrenal gland is preserved. No enhancing renal mass or collecting system dilatation. Area of focal atrophy along the posterior aspect of the lower pole of the right kidney is stable. The ureters have normal course and caliber extending down to the urinary bladder. Bladder is underdistended. Stomach/Bowel: Surgical changes of previous gastric bypass. Limit extends superior and anterior to  the stomach. Small bowel is nondilated. Large bowel is normal course and caliber with scattered stool and diverticula. Normal appendix in the right lower quadrant. Previous changes of colitis no longer identified. Vascular/Lymphatic: Aortic atherosclerosis. No enlarged abdominal or pelvic lymph nodes. Reproductive: Dystrophic calcification along the uterus may be a calcified fibroid, unchanged from previous. No separate adnexal mass Other: Anasarca. Mesenteric stranding with minimal layering areas of fluid, nonspecific. No free air. Rectus muscle diastasis with protuberance and herniation along the anterior abdominal wall. Musculoskeletal: Advanced degenerative changes along the spine. Multilevel stenosis. Right hip arthroplasty. IMPRESSION: Overall no significant interval change. Persistent bilateral pleural effusions, right-greater-than-left with the adjacent parenchymal opacities including some confluent areas consolidation and some ground-glass. No developing nodal enlargement. Stable pericardial effusion. Stable right hepatic lobe liver lesions. The more caudal smaller focus in the right hepatic lobe is less well defined along its margins than the previous. Colonic diverticulosis.  No obstruction. Electronically Signed   By: Karen Kays M.D.   On: 06/07/2023 10:27    Microbiology: Results for orders placed or performed during the hospital encounter of 06/24/23  Resp panel by RT-PCR (RSV, Flu A&B, Covid) Anterior Nasal Swab     Status: None   Collection Time: 06/24/23  4:04 PM   Specimen: Anterior Nasal Swab  Result Value Ref Range Status   SARS Coronavirus 2 by RT PCR NEGATIVE NEGATIVE Final    Comment: (NOTE) SARS-CoV-2 target nucleic acids are NOT DETECTED.  The SARS-CoV-2 RNA is generally detectable in upper respiratory specimens during the acute phase of infection. The lowest concentration of SARS-CoV-2 viral copies this assay can detect is 138 copies/mL. A negative result does not preclude  SARS-Cov-2 infection and should not be used as the sole basis for treatment  or other patient management decisions. A negative result may occur with  improper specimen collection/handling, submission of specimen other than nasopharyngeal swab, presence of viral mutation(s) within the areas targeted by this assay, and inadequate number of viral copies(<138 copies/mL). A negative result must be combined with clinical observations, patient history, and epidemiological information. The expected result is Negative.  Fact Sheet for Patients:  BloggerCourse.com  Fact Sheet for Healthcare Providers:  SeriousBroker.it  This test is no t yet approved or cleared by the [place] FDA and  has been authorized for detection and/or diagnosis of SARS-CoV-2 by FDA under an Emergency Use Authorization (EUA). This EUA will remain  in effect (meaning this test can be used) for the duration of the COVID-19 declaration under Section 564(b)(1) of the Act, 21 U.S.C.section 360bbb-3(b)(1), unless the authorization is terminated  or revoked sooner.       Influenza A by PCR NEGATIVE NEGATIVE Final   Influenza B by PCR NEGATIVE NEGATIVE Final    Comment: (NOTE) The Xpert Xpress SARS-CoV-2/FLU/RSV plus assay is intended as an aid in the diagnosis of influenza from Nasopharyngeal swab specimens and should not be used as a sole basis for treatment. Nasal washings and aspirates are unacceptable for Xpert Xpress SARS-CoV-2/FLU/RSV testing.  Fact Sheet for Patients: BloggerCourse.com  Fact Sheet for Healthcare Providers: SeriousBroker.it  This test is not yet approved or cleared by the [place] FDA and has been authorized for detection and/or diagnosis of SARS-CoV-2 by FDA under an Emergency Use Authorization (EUA). This EUA will remain in effect (meaning this test can be used) for the duration of  the COVID-19 declaration under Section 564(b)(1) of the Act, 21 U.S.C. section 360bbb-3(b)(1), unless the authorization is terminated or revoked.     Resp Syncytial Virus by PCR NEGATIVE NEGATIVE Final    Comment: (NOTE) Fact Sheet for Patients: BloggerCourse.com  Fact Sheet for Healthcare Providers: SeriousBroker.it  This test is not yet approved or cleared by the [place] FDA and has been authorized for detection and/or diagnosis of SARS-CoV-2 by FDA under an Emergency Use Authorization (EUA). This EUA will remain in effect (meaning this test can be used) for the duration of the COVID-19 declaration under Section 564(b)(1) of the Act, 21 U.S.C. section 360bbb-3(b)(1), unless the authorization is terminated or revoked.  Performed at [facility], [address], [place], [place] [address]     Labs: CBC: Recent Labs  Lab 06/24/23 1604 06/25/23 0416 06/27/23 0444  WBC 9.0 11.4* 7.5  HGB 8.7* 9.1* 8.8*  HCT 30.4* 32.1* 30.3*  MCV 101.7* 101.6* 101.0*  PLT 350 383 355   Basic Metabolic Panel: Recent Labs  Lab 06/24/23 1604 06/25/23 0416 06/27/23 0444  NA 140 143 141  K 3.6 3.9 3.7  CL 94* 96* 98  CO2 34* 32 35*  GLUCOSE 99 81 91  BUN 13 10 9   CREATININE 0.74 0.72 0.70  CALCIUM 9.0 9.0 8.7*  PHOS  --   --  3.6   Liver Function Tests: Recent Labs  Lab 06/24/23 1604 06/27/23 0444  AST 15  --   ALT 11  --   ALKPHOS 71  --   BILITOT 0.5  --   PROT 5.9*  --   ALBUMIN 2.4* 2.1*   CBG: No results for input(s): "GLUCAP" in the last 168 hours.  Discharge time spent: greater than 30 minutes.  Signed: Vassie Loll, MD [facility] 4/9/2025Continue modified carbohydrate diet.

## 2023-06-27 NOTE — TOC Transition Note (Signed)
 Transition of Care [facility]) - Discharge Note   Patient Details  Name: Denise [name] MRN: [id number] Date of Birth: 1950-05-30  [facility] CM/SW Contact:  Karn Cassis, LCSW Phone Number: 06/27/2023, 2:23 PM   Clinical Narrative:   Pt d/c today. Pt agreeable to HHPT, RN, aide. No preference on agency. Referred and accepted by [facility] with Frances Furbish. Pt notified. Home health orders in.     Final next level of care: Home w Home Health Services Barriers to Discharge: Barriers Resolved   Patient Goals and CMS Choice Patient states their goals for this hospitalization and ongoing recovery are:: return home   Choice offered to / list presented to : Patient [place] ownership interest in [facility].provided to::  (n/a)    Discharge Placement                    Patient and family notified of of transfer: 06/27/23  Discharge Plan and Services Additional resources added to the After Visit Summary for   In-house Referral: Clinical Social Work                        HH Arranged: Charity fundraiser, PT, Nurse's Aide HH Agency: [facility] Date [facility] Agency Contacted: 06/27/23 Time HH Agency Contacted: [phone] Representative spoke with at [facility] Agency: Kandee Keen  Social Drivers of Health (SDOH) Interventions SDOH Screenings   Food Insecurity: No Food Insecurity (06/24/2023)  Housing: Low Risk  (06/24/2023)  Transportation Needs: No Transportation Needs (06/24/2023)  Utilities: Not At Risk (06/24/2023)  Financial Resource Strain: Low Risk  (12/06/2018)  Physical Activity: Inactive (12/06/2018)  Social Connections: Unknown (06/24/2023)  Recent Concern: Social Connections - Moderately Isolated (06/13/2023)  Stress: No Stress Concern Present (12/09/2018)   Received from [facility], [facility]  Tobacco Use: Medium Risk (06/24/2023)  Health Literacy: Low Risk  (01/01/2022)   Received from [facility], [facility]     Readmission Risk Interventions     06/25/2023    9:51 AM 06/15/2023    2:48 PM 01/12/2023    3:51 PM  Readmission Risk Prevention Plan  Transportation Screening Complete Complete Complete  PCP or Specialist Appt within 3-5 Days   Not Complete  HRI or Home Care Consult Complete Complete Complete  Social Work Consult for Recovery Care Planning/Counseling Complete Complete Complete  Palliative Care Screening Not Applicable Not Applicable Not Applicable  Medication Review Oceanographer) Complete Complete Complete

## 2023-07-01 DIAGNOSIS — I4891 Unspecified atrial fibrillation: Secondary | ICD-10-CM | POA: Diagnosis not present

## 2023-07-01 DIAGNOSIS — Z96641 Presence of right artificial hip joint: Secondary | ICD-10-CM | POA: Diagnosis not present

## 2023-07-01 DIAGNOSIS — Z9181 History of falling: Secondary | ICD-10-CM | POA: Diagnosis not present

## 2023-07-01 DIAGNOSIS — Z7901 Long term (current) use of anticoagulants: Secondary | ICD-10-CM | POA: Diagnosis not present

## 2023-07-01 DIAGNOSIS — J9811 Atelectasis: Secondary | ICD-10-CM | POA: Diagnosis not present

## 2023-07-01 DIAGNOSIS — Z96612 Presence of left artificial shoulder joint: Secondary | ICD-10-CM | POA: Diagnosis not present

## 2023-07-01 DIAGNOSIS — I119 Hypertensive heart disease without heart failure: Secondary | ICD-10-CM | POA: Diagnosis not present

## 2023-07-01 DIAGNOSIS — D630 Anemia in neoplastic disease: Secondary | ICD-10-CM | POA: Diagnosis not present

## 2023-07-01 DIAGNOSIS — J9621 Acute and chronic respiratory failure with hypoxia: Secondary | ICD-10-CM | POA: Diagnosis not present

## 2023-07-01 DIAGNOSIS — I2510 Atherosclerotic heart disease of native coronary artery without angina pectoris: Secondary | ICD-10-CM | POA: Diagnosis not present

## 2023-07-01 DIAGNOSIS — I3139 Other pericardial effusion (noninflammatory): Secondary | ICD-10-CM | POA: Diagnosis not present

## 2023-07-01 DIAGNOSIS — I350 Nonrheumatic aortic (valve) stenosis: Secondary | ICD-10-CM | POA: Diagnosis not present

## 2023-07-01 DIAGNOSIS — C3492 Malignant neoplasm of unspecified part of left bronchus or lung: Secondary | ICD-10-CM | POA: Diagnosis not present

## 2023-07-01 DIAGNOSIS — K5730 Diverticulosis of large intestine without perforation or abscess without bleeding: Secondary | ICD-10-CM | POA: Diagnosis not present

## 2023-07-01 DIAGNOSIS — C7931 Secondary malignant neoplasm of brain: Secondary | ICD-10-CM | POA: Diagnosis not present

## 2023-07-01 DIAGNOSIS — C229 Malignant neoplasm of liver, not specified as primary or secondary: Secondary | ICD-10-CM | POA: Diagnosis not present

## 2023-07-01 DIAGNOSIS — Z9981 Dependence on supplemental oxygen: Secondary | ICD-10-CM | POA: Diagnosis not present

## 2023-07-01 DIAGNOSIS — Z86711 Personal history of pulmonary embolism: Secondary | ICD-10-CM | POA: Diagnosis not present

## 2023-07-01 DIAGNOSIS — K219 Gastro-esophageal reflux disease without esophagitis: Secondary | ICD-10-CM | POA: Diagnosis not present

## 2023-07-01 DIAGNOSIS — D509 Iron deficiency anemia, unspecified: Secondary | ICD-10-CM | POA: Diagnosis not present

## 2023-07-01 DIAGNOSIS — I700 Atherosclerosis of aorta: Secondary | ICD-10-CM | POA: Diagnosis not present

## 2023-07-01 DIAGNOSIS — E119 Type 2 diabetes mellitus without complications: Secondary | ICD-10-CM | POA: Diagnosis not present

## 2023-07-02 ENCOUNTER — Inpatient Hospital Stay: Payer: Medicare Other

## 2023-07-02 ENCOUNTER — Inpatient Hospital Stay: Payer: Medicare Other | Attending: Neurosurgery

## 2023-07-02 ENCOUNTER — Inpatient Hospital Stay: Payer: Medicare Other | Admitting: Hematology

## 2023-07-02 VITALS — BP 101/61 | HR 98 | Temp 96.8°F | Resp 20 | Wt 196.1 lb

## 2023-07-02 DIAGNOSIS — D329 Benign neoplasm of meninges, unspecified: Secondary | ICD-10-CM | POA: Diagnosis not present

## 2023-07-02 DIAGNOSIS — J90 Pleural effusion, not elsewhere classified: Secondary | ICD-10-CM | POA: Diagnosis not present

## 2023-07-02 DIAGNOSIS — Z452 Encounter for adjustment and management of vascular access device: Secondary | ICD-10-CM | POA: Diagnosis not present

## 2023-07-02 DIAGNOSIS — Z95828 Presence of other vascular implants and grafts: Secondary | ICD-10-CM | POA: Diagnosis not present

## 2023-07-02 DIAGNOSIS — D649 Anemia, unspecified: Secondary | ICD-10-CM | POA: Insufficient documentation

## 2023-07-02 DIAGNOSIS — C3412 Malignant neoplasm of upper lobe, left bronchus or lung: Secondary | ICD-10-CM | POA: Insufficient documentation

## 2023-07-02 DIAGNOSIS — C787 Secondary malignant neoplasm of liver and intrahepatic bile duct: Secondary | ICD-10-CM | POA: Insufficient documentation

## 2023-07-02 DIAGNOSIS — C3492 Malignant neoplasm of unspecified part of left bronchus or lung: Secondary | ICD-10-CM

## 2023-07-02 DIAGNOSIS — C7801 Secondary malignant neoplasm of right lung: Secondary | ICD-10-CM | POA: Insufficient documentation

## 2023-07-02 LAB — COMPREHENSIVE METABOLIC PANEL WITH GFR
ALT: 11 U/L (ref 0–44)
AST: 19 U/L (ref 15–41)
Albumin: 2.4 g/dL — ABNORMAL LOW (ref 3.5–5.0)
Alkaline Phosphatase: 86 U/L (ref 38–126)
Anion gap: 10 (ref 5–15)
BUN: 6 mg/dL — ABNORMAL LOW (ref 8–23)
CO2: 33 mmol/L — ABNORMAL HIGH (ref 22–32)
Calcium: 9.1 mg/dL (ref 8.9–10.3)
Chloride: 98 mmol/L (ref 98–111)
Creatinine, Ser: 0.71 mg/dL (ref 0.44–1.00)
GFR, Estimated: >60 mL/min (ref >60)
Glucose, Bld: 108 mg/dL — ABNORMAL HIGH (ref 70–99)
Potassium: 3.7 mmol/L (ref 3.5–5.1)
Sodium: 141 mmol/L (ref 135–145)
Total Bilirubin: 0.3 mg/dL (ref 0.0–1.2)
Total Protein: 6.2 g/dL — ABNORMAL LOW (ref 6.5–8.1)

## 2023-07-02 LAB — CBC WITH DIFFERENTIAL/PLATELET
Abs Immature Granulocytes: 0.02 10*3/uL (ref 0.00–0.07)
Basophils Absolute: 0.1 10*3/uL (ref 0.0–0.1)
Basophils Relative: 1 %
Eosinophils Absolute: 0.2 10*3/uL (ref 0.0–0.5)
Eosinophils Relative: 2 %
HCT: 30.7 % — ABNORMAL LOW (ref 36.0–46.0)
Hemoglobin: 8.9 g/dL — ABNORMAL LOW (ref 12.0–15.0)
Immature Granulocytes: 0 %
Lymphocytes Relative: 14 %
Lymphs Abs: 1.1 10*3/uL (ref 0.7–4.0)
MCH: 29 pg (ref 26.0–34.0)
MCHC: 29 g/dL — ABNORMAL LOW (ref 30.0–36.0)
MCV: 100 fL (ref 80.0–100.0)
Monocytes Absolute: 0.7 10*3/uL (ref 0.1–1.0)
Monocytes Relative: 9 %
Neutro Abs: 5.6 10*3/uL (ref 1.7–7.7)
Neutrophils Relative %: 74 %
Platelets: 437 10*3/uL — ABNORMAL HIGH (ref 150–400)
RBC: 3.07 MIL/uL — ABNORMAL LOW (ref 3.87–5.11)
RDW: 16 % — ABNORMAL HIGH (ref 11.5–15.5)
WBC: 7.6 10*3/uL (ref 4.0–10.5)
nRBC: 0 % (ref 0.0–0.2)

## 2023-07-02 LAB — MAGNESIUM: Magnesium: 1.6 mg/dL — ABNORMAL LOW (ref 1.7–2.4)

## 2023-07-02 MED ORDER — SODIUM CHLORIDE 0.9% FLUSH
10.0000 mL | INTRAVENOUS | Status: DC | PRN
Start: 2023-07-02 — End: 2023-07-02
  Administered 2023-07-02: 10 mL via INTRAVENOUS

## 2023-07-02 MED ORDER — LIDOCAINE-PRILOCAINE 2.5-2.5 % EX CREA: TOPICAL_CREAM | CUTANEOUS | 3 refills | Status: DC

## 2023-07-02 MED ORDER — HEPARIN SOD (PORK) LOCK FLUSH 100 UNIT/ML IV SOLN
500.0000 [IU] | Freq: Once | INTRAVENOUS | Status: AC
  Administered 2023-07-02: 500 [IU] via INTRAVENOUS

## 2023-07-02 NOTE — Progress Notes (Signed)
 Patients port flushed without difficulty.  Port accessed with 20 g needle.  New dressing applied that was clean, dry, and intact.  Good blood return noted with no bruising or swelling noted at site.  Patient remains accessed for treatment.   Unable to chart via IV assessment in flowsheets.

## 2023-07-02 NOTE — Progress Notes (Signed)
 Per MD to hold treatment today. Treatment team and pt made aware. VSS. Pt stable at discharge. Pt escorted via wheelchair to be driven home by family member. All follow ups as scheduled.   Draeden Kellman [employer]

## 2023-07-02 NOTE — Progress Notes (Signed)
 [facility] [address] [place] [address]    Clinic Day:  07/02/2023  Referring physician: Mirna Mires, MD  Patient Care Team: Mirna Mires, MD as PCP - General (Family Medicine) Jonelle Sidle, MD as PCP - Cardiology (Cardiology) Doreatha Massed, MD as Medical Oncologist (Medical Oncology) Nyoka Cowden, MD as Consulting Physician (Pulmonary Disease)   ASSESSMENT & PLAN:   Assessment: 1.  Adenocarcinoma of left lung (HCC) -Chemoradiation therapy with carboplatin and paclitaxel from 01/07/2019 through 02/11/2019. -Consolidation immunotherapy with durvalumab from 03/19/2019 through 04/15/2018, held due to pneumonitis. -CT chest on 07/02/2019 shows left upper lobe lung mass measuring 2.6 x 2.0 cm.  It shows improvement in size. -MRI of the brain on 07/18/2019 showed left sphenoid wing meningioma measuring 2.6 x 2.0 x 2.6 cm unchanged.  No new enhancing intracranial lesion.  Increased left temporal white matter edema. -Durvalumab restarted on 07/24/2019. -Denise [name] was evaluated by cardiology with a stress test.  EF was 46%.  Denise [name] underwent cardiac catheterization which did not show any abnormalities. - 1 year of durvalumab completed on 07/23/2020. - Liver mass biopsy (08/08/2021): Adenocarcinoma, CK7 positive, negative for TTF-1, Napsin a, GATA3, ER, CK20, CDX2 - PET scan (08/25/2021): Right upper lobe lung nodule 9 mm, SUV 5.0.  Groundglass and solid nodule periphery of the right upper lobe 8 mm, SUV 2.45.  Right lobe liver mass 4.5 x 3.6 cm, SUV 7.78. - NGS testing: PD-L1 negative, TMB-low, MSI-stable, K-ras G12 D.  No other targetable mutations. - Carboplatin, pemetrexed and pembrolizumab 4 cycles from 09/22/2021 through 11/25/2021, followed by maintenance pemetrexed and pembrolizumab.  Last pemetrexed and pembrolizumab on 11/30/2022.  Pembrolizumab discontinued due to colitis. -  CT CAP (11/10/2021): Reduced size of liver mass and interval resolution of previously seen  groundglass pulmonary nodules.  There is a new inflammatory right upper lobe nodule.    Plan: 1.  Metastatic adenocarcinoma of the lung to the liver and right lung: - Denise [name] was hospitalized from 06/13/2023 through 06/16/2022 with respiratory symptoms.  Denise [name] underwent right thoracentesis on 06/15/2027 with 0.5 L of bloody fluid removed. - Cytology was negative for malignancy. - Denise [name] was admitted again on 06/24/2023 through 06/27/2023 with acute on chronic respiratory failure.  I have reviewed hospital records. - I reviewed CT scan of the chest from 06/24/2023: Malignancy is stable.  Stable liver lesions. - Denise [name] is feeling weak and will start home physical therapy. - Reviewed labs today: LFTs are normal with albumin 2.4.  Hemoglobin is 8.9. - Will hold her treatment today.  Will reevaluate her in 3 weeks.    2.  Meningioma: - MRI brain (10/02/2022): Stable left sphenoid wing meningioma.  If Denise [name] has symptoms, we will consider reimaging.  3.  Unprovoked pulmonary embolism: - Continue Eliquis 5 mg twice daily indefinitely.  No bleeding issues.   4.  Leg swellings: - Denise [name] is wrapping her legs.  Lasix and CAD or has been on hold since discharge from the hospital.   5.  Normocytic anemia: - Colonoscopy in 2024 showed nonbleeding internal hemorrhoids.  Hemoglobin today is 8.9, anemia multifactorial.   6.  Hypomagnesemia: - Continue magnesium twice daily.  Magnesium is 1.6 today.    No orders of the defined types were placed in this encounter.     Alben Deeds Teague,acting as a Neurosurgeon for Doreatha Massed, MD.,have documented all relevant documentation on the behalf of Doreatha Massed, MD,as directed by  Doreatha Massed, MD while in the presence of [facility]  Ellin Saba, MD.  I, Doreatha Massed MD, have reviewed the above documentation for accuracy and completeness, and I agree with the above.      Doreatha Massed, MD   4/14/20252:14 PM  CHIEF COMPLAINT:   Diagnosis: stage IV  adenocarcinoma of the left lung    Cancer Staging  Adenocarcinoma of left lung [facility]) Staging form: Lung, AJCC 8th Edition - Clinical stage from 01/02/2019: Stage IIIB (cT3, cN2, cM0) - Signed by Doreatha Massed, MD on 01/02/2019 - Pathologic stage from 09/07/2021: Stage IVA (pTX, pNX, pM1b) - Unsigned    Prior Therapy: 1. Chemoradiation with carboplatin and paclitaxel from 01/07/2019 to 02/11/2019. 2. Consolidation with durvalumab from 03/19/2019 to 04/16/2019, held due to pneumonitis.  Current Therapy:  Pemetrexed and pembrolizumab maintenance    HISTORY OF PRESENT ILLNESS:   Oncology History  Adenocarcinoma of left lung (HCC)  12/09/2018 Initial Diagnosis   Adenocarcinoma of left lung (HCC)   01/02/2019 Cancer Staging   Staging form: Lung, AJCC 8th Edition - Clinical stage from 01/02/2019: Stage IIIB (cT3, cN2, cM0) - Signed by Doreatha Massed, MD on 01/02/2019   01/07/2019 - 02/11/2019 Chemotherapy   The patient had palonosetron (ALOXI) injection 0.25 mg, 0.25 mg, Intravenous,  Once, 6 of 6 cycles Administration: 0.25 mg (01/07/2019), 0.25 mg (01/14/2019), 0.25 mg (01/21/2019), 0.25 mg (01/28/2019), 0.25 mg (02/04/2019), 0.25 mg (02/11/2019) CARBOplatin (PARAPLATIN) 270 mg in sodium chloride 0.9 % 250 mL chemo infusion, 270 mg (100 % of original dose 266.4 mg), Intravenous,  Once, 6 of 6 cycles Dose modification:   (original dose 266.4 mg, Cycle 1),   (original dose 266.4 mg, Cycle 2),   (original dose 266.4 mg, Cycle 3),   (original dose 266.4 mg, Cycle 4) Administration: 270 mg (01/07/2019), 270 mg (01/14/2019), 270 mg (01/21/2019), 270 mg (01/28/2019), 270 mg (02/04/2019), 270 mg (02/11/2019) PACLitaxel (TAXOL) 108 mg in sodium chloride 0.9 % 250 mL chemo infusion (</= 80mg /m2), 45 mg/m2 = 108 mg, Intravenous,  Once, 6 of 6 cycles Administration: 108 mg (01/07/2019), 108 mg (01/14/2019), 108 mg (01/21/2019), 108 mg (01/28/2019), 108 mg (02/04/2019), 108 mg  (02/11/2019) fosaprepitant (EMEND) 150 mg, dexamethasone (DECADRON) 12 mg in sodium chloride 0.9 % 145 mL IVPB, , Intravenous,  Once, 5 of 5 cycles Administration:  (01/14/2019),  (01/21/2019),  (01/28/2019),  (02/04/2019),  (02/11/2019)  for chemotherapy treatment.    03/19/2019 - 07/23/2020 Chemotherapy   Patient is on Treatment Plan : LUNG DURVALUMAB Q14D     09/22/2021 - 11/04/2021 Chemotherapy   Patient is on Treatment Plan : LUNG Carboplatin (5) + Pemetrexed (500) + Pembrolizumab (200) D1 q21d Induction x 4 cycles / Maintenance Pemetrexed (500) + Pembrolizumab (200) D1 q21d     09/22/2021 -  Chemotherapy   Patient is on Treatment Plan : LUNG Maintenance Alimta q 21 days        INTERVAL HISTORY:   Denise [name] is a 73 y.o. female presenting to clinic today for follow up of stage IV adenocarcinoma of the left lung. Denise [name] was last seen by me on 06/11/23.  Since her last visit, Denise [name] was admitted to the hospital twice- her first admission was from 06/13/23 to 06/16/23 for acute on chronic hypoxic respiratory failure with bilateral pleural effusions. Jaidon had thoracentesis on 3/28, draining 550 mL of dark red colored fluid with pathology finding no malignant cell and gram stain negative. Her second admission was from 06/24/23 to 06/27/23 for respiratory failure with right side loculated pleural effusion and insufficient fluid for thoracentesis.   Today,  Denise [name] states that Denise [name] is doing well overall. Her appetite level is at 25%. Her energy level is at 10%.  PAST MEDICAL HISTORY:   Past Medical History: Past Medical History:  Diagnosis Date   Anemia    Aortic stenosis    Arthritis    Cancer (HCC)    lung cancer 2019   Coronary artery calcification seen on CT scan    Dyspnea    Essential hypertension    GERD (gastroesophageal reflux disease)    History of lung cancer    Stage III adenocarcinoma status post chemoradiation   Port-A-Cath in place 01/06/2019   Type 2 diabetes mellitus [facility])     Surgical  History: Past Surgical History:  Procedure Laterality Date   BIOPSY  09/25/2022   Procedure: BIOPSY;  Surgeon: Lanelle Bal, DO;  [facility]: [facility];  Service: Endoscopy;;   CHOLECYSTECTOMY  1997   COLONOSCOPY     COLONOSCOPY WITH PROPOFOL N/A 09/25/2022   Procedure: COLONOSCOPY WITH PROPOFOL;  Surgeon: Lanelle Bal, DO;  Location: [facility];  Service: Endoscopy;  Laterality: N/A;  11:00;ASA 3   ESOPHAGEAL BRUSHING  09/25/2022   Procedure: ESOPHAGEAL BRUSHING;  Surgeon: Lanelle Bal, DO;  [facility]: [facility];  Service: Endoscopy;;   ESOPHAGOGASTRODUODENOSCOPY (EGD) WITH PROPOFOL N/A 09/25/2022   Procedure: ESOPHAGOGASTRODUODENOSCOPY (EGD) WITH PROPOFOL;  Surgeon: Lanelle Bal, DO;  [facility]: [facility];  Service: Endoscopy;  Laterality: N/A;  11:00AM;ASA 3   GASTRIC BYPASS     INCISIONAL HERNIA REPAIR  04/11/11   IR IMAGING GUIDED PORT INSERTION  12/27/2018   Right   IR US GUIDE BX ASP/DRAIN  08/08/2021   LAPAROSCOPIC SALPINGOOPHERECTOMY     LAPAROTOMY  04/11/2011   Procedure: EXPLORATORY LAPAROTOMY;  Surgeon: Clovis Pu. Cornett, MD;  Location: [facility];  Service: General;  Laterality: N/A;  closure port hole   POLYPECTOMY  09/25/2022   Procedure: POLYPECTOMY;  Surgeon: Lanelle Bal, DO;  Location: [facility];  Service: Endoscopy;;   RIGHT/LEFT HEART CATH AND CORONARY ANGIOGRAPHY N/A 12/29/2019   Procedure: RIGHT/LEFT HEART CATH AND CORONARY ANGIOGRAPHY;  Surgeon: Lennette Bihari, MD;  Location: [facility];  Service: Cardiovascular;  Laterality: N/A;   TOTAL HIP ARTHROPLASTY  03/07/2012   Procedure: TOTAL HIP ARTHROPLASTY ANTERIOR APPROACH;  Surgeon: Shelda Pal, MD;  Location: [facility];  Service: Orthopedics;  Laterality: Right;   TOTAL SHOULDER ARTHROPLASTY Left 01/21/2015   TOTAL SHOULDER ARTHROPLASTY Left 01/21/2015   Procedure: LEFT TOTAL SHOULDER ARTHROPLASTY;  Surgeon: Francena Hanly, MD;  Location: [facility];  Service: Orthopedics;  Laterality: Left;    VAGINAL HYSTERECTOMY      Social History: Social History   Socioeconomic History   Marital status: Single    Spouse name: Not on file   Number of children: Not on file   Years of education: 12th grade   Highest education level: Not on file  Occupational History   Occupation: Employed    Employer: [employer]  Tobacco Use   Smoking status: Former    Current packs/day: 0.00    Average packs/day: 1 pack/day for 12.0 years (12.0 ttl pk-yrs)    Types: Cigarettes    Start date: 03/20/1964    Quit date: 03/20/1976    Years since quitting: 47.3    Passive exposure: Past   Smokeless tobacco: Never  Vaping Use   Vaping status: Never Used  Substance and Sexual Activity   Alcohol use: No  Drug use: No   Sexual activity: Not Currently  Other Topics Concern   Not on file  Social History Narrative   Not on file   Social Drivers of Health   Financial Resource Strain: Low Risk  (12/06/2018)   Overall Financial Resource Strain (CARDIA)    Difficulty of Paying Living Expenses: Not very hard  Food Insecurity: No Food Insecurity (06/24/2023)   Hunger Vital Sign    Worried About Running Out of Food in the Last Year: Never true    Ran Out of Food in the Last Year: Never true  Transportation Needs: No Transportation Needs (06/24/2023)   PRAPARE - Administrator, Civil Service (Medical): No    Lack of Transportation (Non-Medical): No  Physical Activity: Inactive (12/06/2018)   Exercise Vital Sign    Days of Exercise per Week: 0 days    Minutes of Exercise per Session: 0 min  Stress: No Stress Concern Present (12/09/2018)   Received from [facility], [facility] - Occupational Stress Questionnaire    Feeling of Stress : Not at all  Social Connections: Unknown (06/24/2023)   Social Connection and Isolation Panel [NHANES]    Frequency of Communication with Friends and Family: More than three times a week    Frequency of  Social Gatherings with Friends and Family: Once a week    Attends Religious Services: More than 4 times per year    Active Member of [employer] or Organizations: No    Attends Banker Meetings: Never    Marital Status: Patient declined  Recent Concern: Social Connections - Moderately Isolated (06/13/2023)   Social Connection and Isolation Panel [NHANES]    Frequency of Communication with Friends and Family: More than three times a week    Frequency of Social Gatherings with Friends and Family: Once a week    Attends Religious Services: More than 4 times per year    Active Member of [employer] or Organizations: No    Attends Banker Meetings: Never    Marital Status: Never married  Intimate Partner Violence: Not At Risk (06/24/2023)   Humiliation, Afraid, Rape, and Kick questionnaire    Fear of Current or Ex-Partner: No    Emotionally Abused: No    Physically Abused: No    Sexually Abused: No    Family History: Family History  Problem Relation Age of Onset   Breast cancer Mother    COPD Mother    Arthritis Mother    Diabetes Mother    Hypertension Mother    Hypertension Father    Diabetes Father    Breast cancer Sister    Thyroid cancer Brother    Heart attack Brother    Huntington's disease Maternal Grandmother     Current Medications:  Current Outpatient Medications:    acetaminophen (TYLENOL) 500 MG tablet, Take 500 mg by mouth every 6 (six) hours as needed for moderate pain., Disp: , Rfl:    albuterol (PROVENTIL) (2.5 MG/3ML) 0.083% nebulizer solution, Take 3 mLs (2.5 mg total) by nebulization every 6 (six) hours as needed for wheezing or shortness of breath., Disp: 75 mL, Rfl: 12   albuterol (VENTOLIN HFA) 108 (90 Base) MCG/ACT inhaler, Inhale 2 puffs into the lungs every 6 (six) hours as needed for wheezing or shortness of breath., Disp: 6.7 g, Rfl: 0   ALPRAZolam (XANAX) 0.25 MG tablet, Take 0.125 mg by mouth daily., Disp: , Rfl:  amoxicillin-clavulanate (AUGMENTIN) 875-125 MG tablet, Take 1 tablet by mouth 2 (two) times daily for 5 days., Disp: 10 tablet, Rfl: 0   Calcium Carbonate Antacid (TUMS PO), Take 2-3 tablets by mouth daily., Disp: , Rfl:    Cyanocobalamin (VITAMIN B-12) 2500 MCG SUBL, Place 2,500 mcg under the tongue every morning. , Disp: , Rfl:    diltiazem (CARDIZEM CD) 120 MG 24 hr capsule, Take 1 capsule (120 mg total) by mouth daily., Disp: 30 capsule, Rfl: 1   ferrous sulfate 325 (65 FE) MG tablet, Take 325 mg by mouth every evening., Disp: , Rfl:    folic acid (FOLVITE) 1 MG tablet, TAKE 1 TABLET BY MOUTH EVERY DAY, Disp: 90 tablet, Rfl: 1   gabapentin (NEURONTIN) 600 MG tablet, Take 600 mg by mouth at bedtime., Disp: , Rfl:    Glycerin-Hypromellose-PEG 400 (DRY EYE RELIEF DROPS OP), Apply 1 drop to eye daily as needed (dry eyes)., Disp: , Rfl:    loteprednol (LOTEMAX) 0.5 % ophthalmic suspension, Place 1 drop into both eyes 2 (two) times daily., Disp: , Rfl:    magnesium oxide (MAG-OX) 400 MG tablet, Take 1 tablet by mouth 2 (two) times daily., Disp: , Rfl:    midodrine (PROAMATINE) 10 MG tablet, Take 1 tablet (10 mg total) by mouth 3 (three) times daily with meals., Disp: 90 tablet, Rfl: 2   Multiple Vitamin (MULITIVITAMIN WITH MINERALS) TABS, Take 1 tablet by mouth daily., Disp: , Rfl:    omeprazole (PRILOSEC) 20 MG capsule, Take 20 mg by mouth daily., Disp: , Rfl:    rivaroxaban (XARELTO) 20 MG TABS tablet, Take 1 tablet (20 mg total) by mouth daily with supper., Disp: 30 tablet, Rfl: 3   lidocaine-prilocaine (EMLA) cream, Apply a small amount to port a cath site and cover with plastic wrap 1 hour prior to chemotherapy appointments, Disp: 30 g, Rfl: 3 No current facility-administered medications for this visit.  Facility-Administered Medications Ordered in Other Visits:    magnesium sulfate 2 GM/50ML IVPB, , , ,    sodium chloride flush (NS) 0.9 % injection 10 mL, 10 mL, Intravenous, PRN, Lamya Lausch,  Danae Oland, MD, 10 mL at 07/02/23 1307   Allergies: No Known Allergies  REVIEW OF SYSTEMS:   Review of Systems  Constitutional:  Negative for chills, fatigue and fever.  HENT:   Negative for lump/mass, mouth sores, nosebleeds, sore throat and trouble swallowing.   Eyes:  Negative for eye problems.  Respiratory:  Negative for cough and shortness of breath.   Cardiovascular:  Negative for chest pain, leg swelling and palpitations.  Gastrointestinal:  Negative for abdominal pain, constipation, diarrhea, nausea and vomiting.  Genitourinary:  Negative for bladder incontinence, difficulty urinating, dysuria, frequency, hematuria and nocturia.   Musculoskeletal:  Negative for arthralgias, back pain, flank pain, myalgias and neck pain.  Skin:  Negative for itching and rash.  Neurological:  Negative for dizziness, headaches and numbness.  Hematological:  Does not bruise/bleed easily.  Psychiatric/Behavioral:  Negative for depression, sleep disturbance and suicidal ideas. The patient is not nervous/anxious.   All other systems reviewed and are negative.    VITALS:   There were no vitals taken for this visit.  Wt Readings from Last 3 Encounters:  07/02/23 196 lb 1.6 oz (89 kg)  06/27/23 192 lb 0.3 oz (87.1 kg)  06/13/23 201 lb 15.1 oz (91.6 kg)    There is no height or weight on file to calculate BMI.  Performance status (ECOG): 1 - Symptomatic but completely  ambulatory  PHYSICAL EXAM:   Physical Exam Vitals and nursing note reviewed. Exam conducted with a chaperone present.  Constitutional:      Appearance: Normal appearance.  Cardiovascular:     Rate and Rhythm: Normal rate and regular rhythm.     Pulses: Normal pulses.     Heart sounds: Normal heart sounds.  Pulmonary:     Effort: Pulmonary effort is normal.     Breath sounds: Normal breath sounds.  Abdominal:     Palpations: Abdomen is soft. There is no hepatomegaly, splenomegaly or mass.     Tenderness: There is no abdominal  tenderness.  Musculoskeletal:     Right lower leg: No edema.     Left lower leg: No edema.  Lymphadenopathy:     Cervical: No cervical adenopathy.     Right cervical: No superficial, deep or posterior cervical adenopathy.    Left cervical: No superficial, deep or posterior cervical adenopathy.     Upper Body:     Right upper body: No supraclavicular or axillary adenopathy.     Left upper body: No supraclavicular or axillary adenopathy.  Neurological:     General: No focal deficit present.     Mental Status: Denise [name] is alert and oriented to person, place, and time.  Psychiatric:        Mood and Affect: Mood normal.        Behavior: Behavior normal.     LABS:   CBC     Component Value Date/Time   WBC 7.6 07/02/2023 1307   RBC 3.07 (L) 07/02/2023 1307   HGB 8.9 (L) 07/02/2023 1307   HCT 30.7 (L) 07/02/2023 1307   PLT 437 (H) 07/02/2023 1307   MCV 100.0 07/02/2023 1307   MCH 29.0 07/02/2023 1307   MCHC 29.0 (L) 07/02/2023 1307   RDW 16.0 (H) 07/02/2023 1307   LYMPHSABS 1.1 07/02/2023 1307   MONOABS 0.7 07/02/2023 1307   EOSABS 0.2 07/02/2023 1307   BASOSABS 0.1 07/02/2023 1307    CMP      Component Value Date/Time   NA 141 07/02/2023 1307   K 3.7 07/02/2023 1307   CL 98 07/02/2023 1307   CO2 33 (H) 07/02/2023 1307   GLUCOSE 108 (H) 07/02/2023 1307   BUN 6 (L) 07/02/2023 1307   CREATININE 0.71 07/02/2023 1307   CALCIUM 9.1 07/02/2023 1307   PROT 6.2 (L) 07/02/2023 1307   ALBUMIN 2.4 (L) 07/02/2023 1307   AST 19 07/02/2023 1307   ALT 11 07/02/2023 1307   ALKPHOS 86 07/02/2023 1307   BILITOT 0.3 07/02/2023 1307   GFRNONAA >60 07/02/2023 1307   GFRAA >60 12/11/2019 0817     No results found for: "CEA1", "CEA" / No results found for: "CEA1", "CEA" No results found for: "PSA1" No results found for: "LOV564" No results found for: "CAN125"  No results found for: "TOTALPROTELP", "ALBUMINELP", "A1GS", "A2GS", "BETS", "BETA2SER", "GAMS", "MSPIKE", "SPEI" Lab Results   Component Value Date   TIBC 241 (L) 12/20/2022   TIBC 215 (L) 09/18/2022   TIBC 227 (L) 06/05/2022   FERRITIN 715 (H) 12/20/2022   FERRITIN 596 (H) 09/18/2022   FERRITIN 310 (H) 06/05/2022   IRONPCTSAT 12 12/20/2022   IRONPCTSAT 15 09/18/2022   IRONPCTSAT 17 06/05/2022   Lab Results  Component Value Date   LDH 168 06/25/2023   LDH 178 12/29/2022   LDH 185 12/25/2022     STUDIES:   DG CHEST PORT 1 VIEW Result Date: 06/27/2023 CLINICAL DATA:  Dyspnea.  History of lung cancer. EXAM: PORTABLE CHEST 1 VIEW COMPARISON:  June 24, 2023. FINDINGS: Stable cardiomegaly. Status post left shoulder arthroplasty. Right internal jugular Port-A-Cath is unchanged. Stable loculated right pleural effusion is noted with associated right basilar atelectasis or infiltrate. Continued left upper lobe opacity is noted with pleural based solid component concerning for malignancy given the history of lung cancer. Small left pleural effusion is also noted with associated atelectasis or infiltrate. IMPRESSION: Continued left upper lobe opacity is noted concerning for possible malignancy. Bilateral pleural effusions are again noted with associated atelectasis or infiltrate. Electronically Signed   By: Lupita Raider M.D.   On: 06/27/2023 12:06   [place] CHEST (PLEURAL EFFUSION) Result Date: 06/25/2023 CLINICAL DATA:  Patient with history of metastatic lung cancer, chronic respiratory failure on supplemental oxygen who was admitted yesterday for worsening dyspnea. CT chest was performed and showed small loculated right and left pleural effusions. Request for thoracentesis. EXAM: CHEST ULTRASOUND COMPARISON:  CT chest w/contrast 06/24/23, US thoracentesis 06/15/23 FINDINGS: Limited ultrasound of the right and left chest was performed. Right chest with very small, loculated pleural effusion. Left chest with no appreciable pleural effusion. Exam limited by body habitus. IMPRESSION: [place] chest significant for: 1. Very small, loculated  right pleural effusion which is not amenable to thoracentesis. No procedure was performed. 2.  No appreciable left pleural effusion Electronically Signed   By: Marliss Coots M.D.   On: 06/25/2023 14:17   CT Chest W Contrast Result Date: 06/24/2023 CLINICAL DATA:  History of lung cancer increased short of breath EXAM: CT CHEST WITH CONTRAST TECHNIQUE: Multidetector CT imaging of the chest was performed during intravenous contrast administration. RADIATION DOSE REDUCTION: This exam was performed according to the departmental dose-optimization program which includes automated exposure control, adjustment of the mA and/or kV according to patient size and/or use of iterative reconstruction technique. CONTRAST:  75mL OMNIPAQUE IOHEXOL 300 MG/ML  SOLN COMPARISON:  Chest x-ray 06/24/2023, CT 06/04/2023, 04/19/2023, 07/17/2021, 03/02/2020, PET CT 12/17/2018 FINDINGS: Cardiovascular: Right-sided central venous port with tip at the distal SVC. Moderate aortic atherosclerosis. No aneurysm or dissection. Cardiomegaly. Small pericardial effusion. Aortic valvular calcification. Mediastinum/Nodes: Patent trachea. No thyroid mass. Subcentimeter mediastinal lymph nodes. Esophagus within normal limits. Lungs/Pleura: Small loculated left pleural effusion without substantial change compared to most recent prior exam from March. Slight interval increase in loculated right pleural effusion compared to most recent prior. Stable post therapeutic distortion and consolidation in the left upper lobe. Otherwise similar pattern of consolidations at the right middle and bilateral lower lobes compared to most recent prior. Similar pattern of perihilar ground-glass density. Upper Abdomen: Hepatic metastatic lesions at the right hepatic lobe with index lesion measuring about 35 x 40 mm, previously 39 x 37 mm. No acute finding. Postsurgical changes of the stomach consistent with prior gastric bypass surgery Musculoskeletal: Generalized subcutaneous  edema. No acute or suspicious osseous abnormality. IMPRESSION: 1. Slight interval increase in loculated right pleural effusion compared to most recent prior exam from March. Stable small loculated left pleural effusion. 2. Stable post therapeutic distortion and consolidation in the left upper lobe. Otherwise similar pattern of consolidations at the right middle and bilateral lower lobes compared to most recent prior. Similar pattern of perihilar ground-glass density. 3. Cardiomegaly with small pericardial effusion. 4. Hepatic metastatic lesions, similar to prior. 5. Generalized subcutaneous edema. 6. Aortic atherosclerosis. Aortic Atherosclerosis (ICD10-I70.0). Electronically Signed   By: Jasmine Pang M.D.   On: 06/24/2023 19:05   DG Chest [facility]  1 View Result Date: 06/24/2023 CLINICAL DATA:  Increasing shortness of breath. History of lung cancer. EXAM: PORTABLE CHEST 1 VIEW COMPARISON:  Radiograph 06/16/2023, chest CT 06/04/2023 FINDINGS: Right chest port in place. Partially loculated right pleural effusion has increased from prior exam. Left pleural effusion is similar. Peripheral left upper lobe opacity without interval change. No pneumothorax or pulmonary edema. Stable heart size and mediastinal contours. Left shoulder arthroplasty. Advanced right shoulder arthropathy. IMPRESSION: 1. Partially loculated right pleural effusion has increased from prior exam. 2. Left pleural effusion is similar. 3. Peripheral left upper lobe opacity without interval change. Electronically Signed   By: Narda Rutherford M.D.   On: 06/24/2023 16:28   DG CHEST PORT 1 VIEW Result Date: 06/16/2023 CLINICAL DATA:  Chest pain.  Thoracentesis yesterday. EXAM: PORTABLE CHEST 1 VIEW COMPARISON:  Radiographs 06/15/2023 and 06/13/2023. Chest CT 06/04/2023. FINDINGS: 0705 hours. Right IJ Port-A-Cath extends to the mid SVC level. The heart size and mediastinal contours are stable with cardiomegaly and aortic atherosclerosis. Residual small  right pleural effusion is unchanged. There is improved aeration of the right lung base. Chronic findings in the left hemithorax related to prior radiation therapy are unchanged. No evidence of pneumothorax. No acute osseous findings are seen. IMPRESSION: 1. Improved aeration of the right lung base following thoracentesis. No evidence of pneumothorax. 2. Stable cardiomegaly and chronic findings in the left hemithorax. Electronically Signed   By: Carey Bullocks M.D.   On: 06/16/2023 10:59   US THORACENTESIS ASP PLEURAL SPACE W/IMG GUIDE Result Date: 06/15/2023 INDICATION: Patient with a history of lung cancer and heart failure presents today with a right pleural effusion. Interventional radiology asked to perform a diagnostic and therapeutic thoracentesis. EXAM: ULTRASOUND GUIDED THORACENTESIS MEDICATIONS: 1% lidocaine 10 mL COMPLICATIONS: None immediate. PROCEDURE: An ultrasound guided thoracentesis was thoroughly discussed with the patient and questions answered. The benefits, risks, alternatives and complications were also discussed. The patient understands and wishes to proceed with the procedure. Written consent was obtained. Ultrasound was performed to localize and mark an adequate pocket of fluid in the right chest. The area was then prepped and draped in the normal sterile fashion. 1% Lidocaine was used for local anesthesia. Under ultrasound guidance a 6 Fr Safe-T-Centesis catheter was introduced. Thoracentesis was performed. The catheter was removed and a dressing applied. FINDINGS: A total of approximately 550 mL of blood-tinged fluid was removed. Samples were sent to the laboratory as requested by the clinical team. IMPRESSION: Successful ultrasound guided right thoracentesis yielding 550 mL of pleural fluid. Procedure performed by Alwyn Ren, NP Electronically Signed   By: Marliss Coots M.D.   On: 06/15/2023 16:34   DG Chest 1 View Result Date: 06/15/2023 CLINICAL DATA:  Status post  thoracentesis. EXAM: CHEST  1 VIEW COMPARISON:  Chest x-ray 06/13/2023 FINDINGS: Right chest port catheter tip projects over the mid SVC. The heart is enlarged, unchanged. There central pulmonary vascular congestion. There are patchy opacities in the lung bases. Small left pleural effusion is stable. Right pleural effusion has decreased. There is no pneumothorax. Left shoulder arthroplasty again noted. There severe degenerative changes of the right shoulder. IMPRESSION: 1. No pneumothorax. 2. Decreased right pleural effusion. Stable small left pleural effusion. 3. Stable cardiomegaly and central pulmonary vascular congestion. 4. Patchy opacities in the lung bases may represent atelectasis or infection. Electronically Signed   By: Darliss Cheney M.D.   On: 06/15/2023 16:14   DG Chest Port 1 View Result Date: 06/13/2023 CLINICAL DATA:  Shortness of breath  and chest pain. History of lung cancer. EXAM: PORTABLE CHEST 1 VIEW COMPARISON:  April 16, 2023. FINDINGS: Stable cardiomegaly. Right internal jugular Port-A-Cath is unchanged. Status post left shoulder arthroplasty. Increased bibasilar atelectasis is noted with associated pleural effusions continued left upper lobe airspace opacity is noted concerning for pneumonia. IMPRESSION: Increased bibasilar atelectasis is noted with associated pleural effusions. Stable left upper lobe opacity is noted concerning for pneumonia or atelectasis. Electronically Signed   By: Rosalene Colon M.D.   On: 06/13/2023 16:43   CT CHEST ABDOMEN PELVIS W CONTRAST Result Date: 06/07/2023 CLINICAL DATA:  Monitoring adenocarcinoma of left lung. * Tracking Code: BO * EXAM: CT CHEST, ABDOMEN, AND PELVIS WITH CONTRAST TECHNIQUE: Multidetector CT imaging of the chest, abdomen and pelvis was performed following the standard protocol during bolus administration of intravenous contrast. RADIATION DOSE REDUCTION: This exam was performed according to the departmental dose-optimization program  which includes automated exposure control, adjustment of the mA and/or kV according to patient size and/or use of iterative reconstruction technique. CONTRAST:  100mL OMNIPAQUE IOHEXOL 300 MG/ML  SOLN COMPARISON:  CT angiogram 04/19/2023. Standard CT chest 02/20/2023. Abdomen pelvis 01/11/2023. FINDINGS: CT CHEST FINDINGS Cardiovascular: Right IJ chest port. Port is accessed. Tip seen along the SVC right atrial junction. Heart is slightly large. Mild-to-moderate pericardial effusion is again seen. Prominent calcifications along the aortic valve and along the coronary arteries. Mild calcified plaque along the course of the thoracic aorta. Mediastinum/Nodes: Slightly patulous thoracic esophagus. Preserved thyroid gland. No specific abnormal lymph node enlargement identified in the axillary regions, hilum or mediastinum. Portions of the left upper chest are obscured by the extreme streak artifact from the left hip arthroplasty. Lungs/Pleura: Bilateral pleural effusions are seen, small left and moderate right. The right side has some loculated components as seen previous. Additional lungs demonstrates some scattered ground-glass in a perihilar distribution on the right as well as some bronchial wall thickening and more confluent opacity at the right lower lobe and middle lobe. Consolidative area with air bronchograms in the posterior left upper lobe are also again seen. Some perihilar areas left lower lobe. The amount opacity seen at the left upper lung is appears slightly decreased from previous. No new dominant lung mass. Musculoskeletal: Streak artifact related to the patient's left shoulder arthroplasty. Advanced degenerative changes of the right shoulder. Scattered degenerative changes along the spine with some curvature. CT ABDOMEN PELVIS FINDINGS Hepatobiliary: Previous cholecystectomy. Patent portal vein. Benign-appearing scattered hepatic cystic foci identified such as left hepatic lobe series 2, image 54,  unchanged. Heterogeneous lesions in the right hepatic lobe are again noted. The lesion which previously measured 3.5 x 4.1 cm, today on series 2, image 56 measures 3.9 x 3.7 cm with central area of necrosis. Lesions caudal this appears similar as well but the margins are less well defined. Pancreas: Unremarkable. No pancreatic ductal dilatation or surrounding inflammatory changes. Spleen: Normal in size without focal abnormality. Adrenals/Urinary Tract: Stable thickening of the left adrenal gland. Right adrenal gland is preserved. No enhancing renal mass or collecting system dilatation. Area of focal atrophy along the posterior aspect of the lower pole of the right kidney is stable. The ureters have normal course and caliber extending down to the urinary bladder. Bladder is underdistended. Stomach/Bowel: Surgical changes of previous gastric bypass. Limit extends superior and anterior to the stomach. Small bowel is nondilated. Large bowel is normal course and caliber with scattered stool and diverticula. Normal appendix in the right lower quadrant. Previous changes of colitis  no longer identified. Vascular/Lymphatic: Aortic atherosclerosis. No enlarged abdominal or pelvic lymph nodes. Reproductive: Dystrophic calcification along the uterus may be a calcified fibroid, unchanged from previous. No separate adnexal mass Other: Anasarca. Mesenteric stranding with minimal layering areas of fluid, nonspecific. No free air. Rectus muscle diastasis with protuberance and herniation along the anterior abdominal wall. Musculoskeletal: Advanced degenerative changes along the spine. Multilevel stenosis. Right hip arthroplasty. IMPRESSION: Overall no significant interval change. Persistent bilateral pleural effusions, right-greater-than-left with the adjacent parenchymal opacities including some confluent areas consolidation and some ground-glass. No developing nodal enlargement. Stable pericardial effusion. Stable right hepatic  lobe liver lesions. The more caudal smaller focus in the right hepatic lobe is less well defined along its margins than the previous. Colonic diverticulosis.  No obstruction. Electronically Signed   By: Adrianna Horde M.D.   On: 06/07/2023 10:27

## 2023-07-02 NOTE — Patient Instructions (Signed)

## 2023-07-03 DIAGNOSIS — I3139 Other pericardial effusion (noninflammatory): Secondary | ICD-10-CM | POA: Diagnosis not present

## 2023-07-03 DIAGNOSIS — K219 Gastro-esophageal reflux disease without esophagitis: Secondary | ICD-10-CM | POA: Diagnosis not present

## 2023-07-03 DIAGNOSIS — Z96612 Presence of left artificial shoulder joint: Secondary | ICD-10-CM | POA: Diagnosis not present

## 2023-07-03 DIAGNOSIS — Z9981 Dependence on supplemental oxygen: Secondary | ICD-10-CM | POA: Diagnosis not present

## 2023-07-03 DIAGNOSIS — D509 Iron deficiency anemia, unspecified: Secondary | ICD-10-CM | POA: Diagnosis not present

## 2023-07-03 DIAGNOSIS — I2510 Atherosclerotic heart disease of native coronary artery without angina pectoris: Secondary | ICD-10-CM | POA: Diagnosis not present

## 2023-07-03 DIAGNOSIS — C7931 Secondary malignant neoplasm of brain: Secondary | ICD-10-CM | POA: Diagnosis not present

## 2023-07-03 DIAGNOSIS — I700 Atherosclerosis of aorta: Secondary | ICD-10-CM | POA: Diagnosis not present

## 2023-07-03 DIAGNOSIS — K5730 Diverticulosis of large intestine without perforation or abscess without bleeding: Secondary | ICD-10-CM | POA: Diagnosis not present

## 2023-07-03 DIAGNOSIS — I350 Nonrheumatic aortic (valve) stenosis: Secondary | ICD-10-CM | POA: Diagnosis not present

## 2023-07-03 DIAGNOSIS — E119 Type 2 diabetes mellitus without complications: Secondary | ICD-10-CM | POA: Diagnosis not present

## 2023-07-03 DIAGNOSIS — I119 Hypertensive heart disease without heart failure: Secondary | ICD-10-CM | POA: Diagnosis not present

## 2023-07-03 DIAGNOSIS — Z7901 Long term (current) use of anticoagulants: Secondary | ICD-10-CM | POA: Diagnosis not present

## 2023-07-03 DIAGNOSIS — Z9181 History of falling: Secondary | ICD-10-CM | POA: Diagnosis not present

## 2023-07-03 DIAGNOSIS — C229 Malignant neoplasm of liver, not specified as primary or secondary: Secondary | ICD-10-CM | POA: Diagnosis not present

## 2023-07-03 DIAGNOSIS — D630 Anemia in neoplastic disease: Secondary | ICD-10-CM | POA: Diagnosis not present

## 2023-07-03 DIAGNOSIS — I4891 Unspecified atrial fibrillation: Secondary | ICD-10-CM | POA: Diagnosis not present

## 2023-07-03 DIAGNOSIS — C3492 Malignant neoplasm of unspecified part of left bronchus or lung: Secondary | ICD-10-CM | POA: Diagnosis not present

## 2023-07-03 DIAGNOSIS — J9811 Atelectasis: Secondary | ICD-10-CM | POA: Diagnosis not present

## 2023-07-03 DIAGNOSIS — Z96641 Presence of right artificial hip joint: Secondary | ICD-10-CM | POA: Diagnosis not present

## 2023-07-03 DIAGNOSIS — Z86711 Personal history of pulmonary embolism: Secondary | ICD-10-CM | POA: Diagnosis not present

## 2023-07-03 DIAGNOSIS — J9621 Acute and chronic respiratory failure with hypoxia: Secondary | ICD-10-CM | POA: Diagnosis not present

## 2023-07-05 DIAGNOSIS — I700 Atherosclerosis of aorta: Secondary | ICD-10-CM | POA: Diagnosis not present

## 2023-07-05 DIAGNOSIS — Z9181 History of falling: Secondary | ICD-10-CM | POA: Diagnosis not present

## 2023-07-05 DIAGNOSIS — C229 Malignant neoplasm of liver, not specified as primary or secondary: Secondary | ICD-10-CM | POA: Diagnosis not present

## 2023-07-05 DIAGNOSIS — E119 Type 2 diabetes mellitus without complications: Secondary | ICD-10-CM | POA: Diagnosis not present

## 2023-07-05 DIAGNOSIS — Z7901 Long term (current) use of anticoagulants: Secondary | ICD-10-CM | POA: Diagnosis not present

## 2023-07-05 DIAGNOSIS — K219 Gastro-esophageal reflux disease without esophagitis: Secondary | ICD-10-CM | POA: Diagnosis not present

## 2023-07-05 DIAGNOSIS — K5730 Diverticulosis of large intestine without perforation or abscess without bleeding: Secondary | ICD-10-CM | POA: Diagnosis not present

## 2023-07-05 DIAGNOSIS — I4891 Unspecified atrial fibrillation: Secondary | ICD-10-CM | POA: Diagnosis not present

## 2023-07-05 DIAGNOSIS — C7931 Secondary malignant neoplasm of brain: Secondary | ICD-10-CM | POA: Diagnosis not present

## 2023-07-05 DIAGNOSIS — Z96641 Presence of right artificial hip joint: Secondary | ICD-10-CM | POA: Diagnosis not present

## 2023-07-05 DIAGNOSIS — D509 Iron deficiency anemia, unspecified: Secondary | ICD-10-CM | POA: Diagnosis not present

## 2023-07-05 DIAGNOSIS — I3139 Other pericardial effusion (noninflammatory): Secondary | ICD-10-CM | POA: Diagnosis not present

## 2023-07-05 DIAGNOSIS — Z9981 Dependence on supplemental oxygen: Secondary | ICD-10-CM | POA: Diagnosis not present

## 2023-07-05 DIAGNOSIS — I2510 Atherosclerotic heart disease of native coronary artery without angina pectoris: Secondary | ICD-10-CM | POA: Diagnosis not present

## 2023-07-05 DIAGNOSIS — J9811 Atelectasis: Secondary | ICD-10-CM | POA: Diagnosis not present

## 2023-07-05 DIAGNOSIS — I350 Nonrheumatic aortic (valve) stenosis: Secondary | ICD-10-CM | POA: Diagnosis not present

## 2023-07-05 DIAGNOSIS — I119 Hypertensive heart disease without heart failure: Secondary | ICD-10-CM | POA: Diagnosis not present

## 2023-07-05 DIAGNOSIS — Z96612 Presence of left artificial shoulder joint: Secondary | ICD-10-CM | POA: Diagnosis not present

## 2023-07-05 DIAGNOSIS — D630 Anemia in neoplastic disease: Secondary | ICD-10-CM | POA: Diagnosis not present

## 2023-07-05 DIAGNOSIS — Z86711 Personal history of pulmonary embolism: Secondary | ICD-10-CM | POA: Diagnosis not present

## 2023-07-05 DIAGNOSIS — J9621 Acute and chronic respiratory failure with hypoxia: Secondary | ICD-10-CM | POA: Diagnosis not present

## 2023-07-05 DIAGNOSIS — C3492 Malignant neoplasm of unspecified part of left bronchus or lung: Secondary | ICD-10-CM | POA: Diagnosis not present

## 2023-07-08 DIAGNOSIS — I10 Essential (primary) hypertension: Secondary | ICD-10-CM | POA: Diagnosis not present

## 2023-07-08 DIAGNOSIS — E119 Type 2 diabetes mellitus without complications: Secondary | ICD-10-CM | POA: Diagnosis not present

## 2023-07-08 DIAGNOSIS — I2699 Other pulmonary embolism without acute cor pulmonale: Secondary | ICD-10-CM | POA: Diagnosis not present

## 2023-07-08 DIAGNOSIS — T148XXA Other injury of unspecified body region, initial encounter: Secondary | ICD-10-CM | POA: Diagnosis not present

## 2023-07-09 DIAGNOSIS — J9811 Atelectasis: Secondary | ICD-10-CM | POA: Diagnosis not present

## 2023-07-09 DIAGNOSIS — C3492 Malignant neoplasm of unspecified part of left bronchus or lung: Secondary | ICD-10-CM | POA: Diagnosis not present

## 2023-07-09 DIAGNOSIS — I2510 Atherosclerotic heart disease of native coronary artery without angina pectoris: Secondary | ICD-10-CM | POA: Diagnosis not present

## 2023-07-09 DIAGNOSIS — E119 Type 2 diabetes mellitus without complications: Secondary | ICD-10-CM | POA: Diagnosis not present

## 2023-07-09 DIAGNOSIS — Z7901 Long term (current) use of anticoagulants: Secondary | ICD-10-CM | POA: Diagnosis not present

## 2023-07-09 DIAGNOSIS — I119 Hypertensive heart disease without heart failure: Secondary | ICD-10-CM | POA: Diagnosis not present

## 2023-07-09 DIAGNOSIS — K219 Gastro-esophageal reflux disease without esophagitis: Secondary | ICD-10-CM | POA: Diagnosis not present

## 2023-07-09 DIAGNOSIS — Z9181 History of falling: Secondary | ICD-10-CM | POA: Diagnosis not present

## 2023-07-09 DIAGNOSIS — I4891 Unspecified atrial fibrillation: Secondary | ICD-10-CM | POA: Diagnosis not present

## 2023-07-09 DIAGNOSIS — J9621 Acute and chronic respiratory failure with hypoxia: Secondary | ICD-10-CM | POA: Diagnosis not present

## 2023-07-09 DIAGNOSIS — K5730 Diverticulosis of large intestine without perforation or abscess without bleeding: Secondary | ICD-10-CM | POA: Diagnosis not present

## 2023-07-09 DIAGNOSIS — D509 Iron deficiency anemia, unspecified: Secondary | ICD-10-CM | POA: Diagnosis not present

## 2023-07-09 DIAGNOSIS — Z96612 Presence of left artificial shoulder joint: Secondary | ICD-10-CM | POA: Diagnosis not present

## 2023-07-09 DIAGNOSIS — I700 Atherosclerosis of aorta: Secondary | ICD-10-CM | POA: Diagnosis not present

## 2023-07-09 DIAGNOSIS — I3139 Other pericardial effusion (noninflammatory): Secondary | ICD-10-CM | POA: Diagnosis not present

## 2023-07-09 DIAGNOSIS — C229 Malignant neoplasm of liver, not specified as primary or secondary: Secondary | ICD-10-CM | POA: Diagnosis not present

## 2023-07-09 DIAGNOSIS — D630 Anemia in neoplastic disease: Secondary | ICD-10-CM | POA: Diagnosis not present

## 2023-07-09 DIAGNOSIS — I350 Nonrheumatic aortic (valve) stenosis: Secondary | ICD-10-CM | POA: Diagnosis not present

## 2023-07-09 DIAGNOSIS — Z9981 Dependence on supplemental oxygen: Secondary | ICD-10-CM | POA: Diagnosis not present

## 2023-07-09 DIAGNOSIS — Z96641 Presence of right artificial hip joint: Secondary | ICD-10-CM | POA: Diagnosis not present

## 2023-07-09 DIAGNOSIS — C7931 Secondary malignant neoplasm of brain: Secondary | ICD-10-CM | POA: Diagnosis not present

## 2023-07-09 DIAGNOSIS — Z86711 Personal history of pulmonary embolism: Secondary | ICD-10-CM | POA: Diagnosis not present

## 2023-07-11 DIAGNOSIS — K219 Gastro-esophageal reflux disease without esophagitis: Secondary | ICD-10-CM | POA: Diagnosis not present

## 2023-07-11 DIAGNOSIS — Z96641 Presence of right artificial hip joint: Secondary | ICD-10-CM | POA: Diagnosis not present

## 2023-07-11 DIAGNOSIS — I4891 Unspecified atrial fibrillation: Secondary | ICD-10-CM | POA: Diagnosis not present

## 2023-07-11 DIAGNOSIS — Z96612 Presence of left artificial shoulder joint: Secondary | ICD-10-CM | POA: Diagnosis not present

## 2023-07-11 DIAGNOSIS — J9621 Acute and chronic respiratory failure with hypoxia: Secondary | ICD-10-CM | POA: Diagnosis not present

## 2023-07-11 DIAGNOSIS — D509 Iron deficiency anemia, unspecified: Secondary | ICD-10-CM | POA: Diagnosis not present

## 2023-07-11 DIAGNOSIS — I3139 Other pericardial effusion (noninflammatory): Secondary | ICD-10-CM | POA: Diagnosis not present

## 2023-07-11 DIAGNOSIS — J9811 Atelectasis: Secondary | ICD-10-CM | POA: Diagnosis not present

## 2023-07-11 DIAGNOSIS — C3492 Malignant neoplasm of unspecified part of left bronchus or lung: Secondary | ICD-10-CM | POA: Diagnosis not present

## 2023-07-11 DIAGNOSIS — Z7901 Long term (current) use of anticoagulants: Secondary | ICD-10-CM | POA: Diagnosis not present

## 2023-07-11 DIAGNOSIS — Z9181 History of falling: Secondary | ICD-10-CM | POA: Diagnosis not present

## 2023-07-11 DIAGNOSIS — I119 Hypertensive heart disease without heart failure: Secondary | ICD-10-CM | POA: Diagnosis not present

## 2023-07-11 DIAGNOSIS — I350 Nonrheumatic aortic (valve) stenosis: Secondary | ICD-10-CM | POA: Diagnosis not present

## 2023-07-11 DIAGNOSIS — C7931 Secondary malignant neoplasm of brain: Secondary | ICD-10-CM | POA: Diagnosis not present

## 2023-07-11 DIAGNOSIS — C229 Malignant neoplasm of liver, not specified as primary or secondary: Secondary | ICD-10-CM | POA: Diagnosis not present

## 2023-07-11 DIAGNOSIS — Z86711 Personal history of pulmonary embolism: Secondary | ICD-10-CM | POA: Diagnosis not present

## 2023-07-11 DIAGNOSIS — I2510 Atherosclerotic heart disease of native coronary artery without angina pectoris: Secondary | ICD-10-CM | POA: Diagnosis not present

## 2023-07-11 DIAGNOSIS — K5730 Diverticulosis of large intestine without perforation or abscess without bleeding: Secondary | ICD-10-CM | POA: Diagnosis not present

## 2023-07-11 DIAGNOSIS — E119 Type 2 diabetes mellitus without complications: Secondary | ICD-10-CM | POA: Diagnosis not present

## 2023-07-11 DIAGNOSIS — I700 Atherosclerosis of aorta: Secondary | ICD-10-CM | POA: Diagnosis not present

## 2023-07-11 DIAGNOSIS — Z9981 Dependence on supplemental oxygen: Secondary | ICD-10-CM | POA: Diagnosis not present

## 2023-07-11 DIAGNOSIS — D630 Anemia in neoplastic disease: Secondary | ICD-10-CM | POA: Diagnosis not present

## 2023-07-12 DIAGNOSIS — C7931 Secondary malignant neoplasm of brain: Secondary | ICD-10-CM | POA: Diagnosis not present

## 2023-07-12 DIAGNOSIS — C229 Malignant neoplasm of liver, not specified as primary or secondary: Secondary | ICD-10-CM | POA: Diagnosis not present

## 2023-07-12 DIAGNOSIS — J9811 Atelectasis: Secondary | ICD-10-CM | POA: Diagnosis not present

## 2023-07-12 DIAGNOSIS — Z9981 Dependence on supplemental oxygen: Secondary | ICD-10-CM | POA: Diagnosis not present

## 2023-07-12 DIAGNOSIS — D509 Iron deficiency anemia, unspecified: Secondary | ICD-10-CM | POA: Diagnosis not present

## 2023-07-12 DIAGNOSIS — I700 Atherosclerosis of aorta: Secondary | ICD-10-CM | POA: Diagnosis not present

## 2023-07-12 DIAGNOSIS — J9621 Acute and chronic respiratory failure with hypoxia: Secondary | ICD-10-CM | POA: Diagnosis not present

## 2023-07-12 DIAGNOSIS — Z9181 History of falling: Secondary | ICD-10-CM | POA: Diagnosis not present

## 2023-07-12 DIAGNOSIS — I119 Hypertensive heart disease without heart failure: Secondary | ICD-10-CM | POA: Diagnosis not present

## 2023-07-12 DIAGNOSIS — Z96612 Presence of left artificial shoulder joint: Secondary | ICD-10-CM | POA: Diagnosis not present

## 2023-07-12 DIAGNOSIS — E119 Type 2 diabetes mellitus without complications: Secondary | ICD-10-CM | POA: Diagnosis not present

## 2023-07-12 DIAGNOSIS — D630 Anemia in neoplastic disease: Secondary | ICD-10-CM | POA: Diagnosis not present

## 2023-07-12 DIAGNOSIS — Z96641 Presence of right artificial hip joint: Secondary | ICD-10-CM | POA: Diagnosis not present

## 2023-07-12 DIAGNOSIS — C3492 Malignant neoplasm of unspecified part of left bronchus or lung: Secondary | ICD-10-CM | POA: Diagnosis not present

## 2023-07-12 DIAGNOSIS — K219 Gastro-esophageal reflux disease without esophagitis: Secondary | ICD-10-CM | POA: Diagnosis not present

## 2023-07-12 DIAGNOSIS — I3139 Other pericardial effusion (noninflammatory): Secondary | ICD-10-CM | POA: Diagnosis not present

## 2023-07-12 DIAGNOSIS — I2510 Atherosclerotic heart disease of native coronary artery without angina pectoris: Secondary | ICD-10-CM | POA: Diagnosis not present

## 2023-07-12 DIAGNOSIS — I4891 Unspecified atrial fibrillation: Secondary | ICD-10-CM | POA: Diagnosis not present

## 2023-07-12 DIAGNOSIS — K5730 Diverticulosis of large intestine without perforation or abscess without bleeding: Secondary | ICD-10-CM | POA: Diagnosis not present

## 2023-07-12 DIAGNOSIS — I350 Nonrheumatic aortic (valve) stenosis: Secondary | ICD-10-CM | POA: Diagnosis not present

## 2023-07-12 DIAGNOSIS — Z86711 Personal history of pulmonary embolism: Secondary | ICD-10-CM | POA: Diagnosis not present

## 2023-07-12 DIAGNOSIS — Z7901 Long term (current) use of anticoagulants: Secondary | ICD-10-CM | POA: Diagnosis not present

## 2023-07-13 DIAGNOSIS — E119 Type 2 diabetes mellitus without complications: Secondary | ICD-10-CM | POA: Diagnosis not present

## 2023-07-13 DIAGNOSIS — Z9981 Dependence on supplemental oxygen: Secondary | ICD-10-CM | POA: Diagnosis not present

## 2023-07-13 DIAGNOSIS — I119 Hypertensive heart disease without heart failure: Secondary | ICD-10-CM | POA: Diagnosis not present

## 2023-07-13 DIAGNOSIS — C3492 Malignant neoplasm of unspecified part of left bronchus or lung: Secondary | ICD-10-CM | POA: Diagnosis not present

## 2023-07-13 DIAGNOSIS — Z96612 Presence of left artificial shoulder joint: Secondary | ICD-10-CM | POA: Diagnosis not present

## 2023-07-13 DIAGNOSIS — I4891 Unspecified atrial fibrillation: Secondary | ICD-10-CM | POA: Diagnosis not present

## 2023-07-13 DIAGNOSIS — J9621 Acute and chronic respiratory failure with hypoxia: Secondary | ICD-10-CM | POA: Diagnosis not present

## 2023-07-13 DIAGNOSIS — C229 Malignant neoplasm of liver, not specified as primary or secondary: Secondary | ICD-10-CM | POA: Diagnosis not present

## 2023-07-13 DIAGNOSIS — C7931 Secondary malignant neoplasm of brain: Secondary | ICD-10-CM | POA: Diagnosis not present

## 2023-07-13 DIAGNOSIS — Z96641 Presence of right artificial hip joint: Secondary | ICD-10-CM | POA: Diagnosis not present

## 2023-07-13 DIAGNOSIS — I700 Atherosclerosis of aorta: Secondary | ICD-10-CM | POA: Diagnosis not present

## 2023-07-13 DIAGNOSIS — J9811 Atelectasis: Secondary | ICD-10-CM | POA: Diagnosis not present

## 2023-07-13 DIAGNOSIS — D630 Anemia in neoplastic disease: Secondary | ICD-10-CM | POA: Diagnosis not present

## 2023-07-13 DIAGNOSIS — I350 Nonrheumatic aortic (valve) stenosis: Secondary | ICD-10-CM | POA: Diagnosis not present

## 2023-07-13 DIAGNOSIS — K5730 Diverticulosis of large intestine without perforation or abscess without bleeding: Secondary | ICD-10-CM | POA: Diagnosis not present

## 2023-07-13 DIAGNOSIS — D509 Iron deficiency anemia, unspecified: Secondary | ICD-10-CM | POA: Diagnosis not present

## 2023-07-13 DIAGNOSIS — Z86711 Personal history of pulmonary embolism: Secondary | ICD-10-CM | POA: Diagnosis not present

## 2023-07-13 DIAGNOSIS — Z7901 Long term (current) use of anticoagulants: Secondary | ICD-10-CM | POA: Diagnosis not present

## 2023-07-13 DIAGNOSIS — Z9181 History of falling: Secondary | ICD-10-CM | POA: Diagnosis not present

## 2023-07-13 DIAGNOSIS — I2510 Atherosclerotic heart disease of native coronary artery without angina pectoris: Secondary | ICD-10-CM | POA: Diagnosis not present

## 2023-07-13 DIAGNOSIS — I3139 Other pericardial effusion (noninflammatory): Secondary | ICD-10-CM | POA: Diagnosis not present

## 2023-07-13 DIAGNOSIS — K219 Gastro-esophageal reflux disease without esophagitis: Secondary | ICD-10-CM | POA: Diagnosis not present

## 2023-07-16 DIAGNOSIS — I479 Paroxysmal tachycardia, unspecified: Secondary | ICD-10-CM | POA: Diagnosis not present

## 2023-07-16 DIAGNOSIS — R7303 Prediabetes: Secondary | ICD-10-CM | POA: Diagnosis not present

## 2023-07-16 DIAGNOSIS — I10 Essential (primary) hypertension: Secondary | ICD-10-CM | POA: Diagnosis not present

## 2023-07-16 DIAGNOSIS — C3492 Malignant neoplasm of unspecified part of left bronchus or lung: Secondary | ICD-10-CM | POA: Diagnosis not present

## 2023-07-16 DIAGNOSIS — Z9981 Dependence on supplemental oxygen: Secondary | ICD-10-CM | POA: Diagnosis not present

## 2023-07-16 DIAGNOSIS — I480 Paroxysmal atrial fibrillation: Secondary | ICD-10-CM | POA: Diagnosis not present

## 2023-07-16 DIAGNOSIS — I2699 Other pulmonary embolism without acute cor pulmonale: Secondary | ICD-10-CM | POA: Diagnosis not present

## 2023-07-16 DIAGNOSIS — J9611 Chronic respiratory failure with hypoxia: Secondary | ICD-10-CM | POA: Diagnosis not present

## 2023-07-16 DIAGNOSIS — D630 Anemia in neoplastic disease: Secondary | ICD-10-CM | POA: Diagnosis not present

## 2023-07-16 DIAGNOSIS — E119 Type 2 diabetes mellitus without complications: Secondary | ICD-10-CM | POA: Diagnosis not present

## 2023-07-16 DIAGNOSIS — T148XXA Other injury of unspecified body region, initial encounter: Secondary | ICD-10-CM | POA: Diagnosis not present

## 2023-07-18 DIAGNOSIS — I119 Hypertensive heart disease without heart failure: Secondary | ICD-10-CM | POA: Diagnosis not present

## 2023-07-18 DIAGNOSIS — Z86711 Personal history of pulmonary embolism: Secondary | ICD-10-CM | POA: Diagnosis not present

## 2023-07-18 DIAGNOSIS — C7931 Secondary malignant neoplasm of brain: Secondary | ICD-10-CM | POA: Diagnosis not present

## 2023-07-18 DIAGNOSIS — Z9181 History of falling: Secondary | ICD-10-CM | POA: Diagnosis not present

## 2023-07-18 DIAGNOSIS — I700 Atherosclerosis of aorta: Secondary | ICD-10-CM | POA: Diagnosis not present

## 2023-07-18 DIAGNOSIS — I3139 Other pericardial effusion (noninflammatory): Secondary | ICD-10-CM | POA: Diagnosis not present

## 2023-07-18 DIAGNOSIS — Z9981 Dependence on supplemental oxygen: Secondary | ICD-10-CM | POA: Diagnosis not present

## 2023-07-18 DIAGNOSIS — C229 Malignant neoplasm of liver, not specified as primary or secondary: Secondary | ICD-10-CM | POA: Diagnosis not present

## 2023-07-18 DIAGNOSIS — E119 Type 2 diabetes mellitus without complications: Secondary | ICD-10-CM | POA: Diagnosis not present

## 2023-07-18 DIAGNOSIS — K219 Gastro-esophageal reflux disease without esophagitis: Secondary | ICD-10-CM | POA: Diagnosis not present

## 2023-07-18 DIAGNOSIS — Z96641 Presence of right artificial hip joint: Secondary | ICD-10-CM | POA: Diagnosis not present

## 2023-07-18 DIAGNOSIS — K5730 Diverticulosis of large intestine without perforation or abscess without bleeding: Secondary | ICD-10-CM | POA: Diagnosis not present

## 2023-07-18 DIAGNOSIS — C3492 Malignant neoplasm of unspecified part of left bronchus or lung: Secondary | ICD-10-CM | POA: Diagnosis not present

## 2023-07-18 DIAGNOSIS — I2510 Atherosclerotic heart disease of native coronary artery without angina pectoris: Secondary | ICD-10-CM | POA: Diagnosis not present

## 2023-07-18 DIAGNOSIS — D509 Iron deficiency anemia, unspecified: Secondary | ICD-10-CM | POA: Diagnosis not present

## 2023-07-18 DIAGNOSIS — Z7901 Long term (current) use of anticoagulants: Secondary | ICD-10-CM | POA: Diagnosis not present

## 2023-07-18 DIAGNOSIS — D630 Anemia in neoplastic disease: Secondary | ICD-10-CM | POA: Diagnosis not present

## 2023-07-18 DIAGNOSIS — I4891 Unspecified atrial fibrillation: Secondary | ICD-10-CM | POA: Diagnosis not present

## 2023-07-18 DIAGNOSIS — J9621 Acute and chronic respiratory failure with hypoxia: Secondary | ICD-10-CM | POA: Diagnosis not present

## 2023-07-18 DIAGNOSIS — J9811 Atelectasis: Secondary | ICD-10-CM | POA: Diagnosis not present

## 2023-07-18 DIAGNOSIS — I350 Nonrheumatic aortic (valve) stenosis: Secondary | ICD-10-CM | POA: Diagnosis not present

## 2023-07-18 DIAGNOSIS — Z96612 Presence of left artificial shoulder joint: Secondary | ICD-10-CM | POA: Diagnosis not present

## 2023-07-20 DIAGNOSIS — E119 Type 2 diabetes mellitus without complications: Secondary | ICD-10-CM | POA: Diagnosis not present

## 2023-07-20 DIAGNOSIS — J9811 Atelectasis: Secondary | ICD-10-CM | POA: Diagnosis not present

## 2023-07-20 DIAGNOSIS — I700 Atherosclerosis of aorta: Secondary | ICD-10-CM | POA: Diagnosis not present

## 2023-07-20 DIAGNOSIS — Z9181 History of falling: Secondary | ICD-10-CM | POA: Diagnosis not present

## 2023-07-20 DIAGNOSIS — D630 Anemia in neoplastic disease: Secondary | ICD-10-CM | POA: Diagnosis not present

## 2023-07-20 DIAGNOSIS — I4891 Unspecified atrial fibrillation: Secondary | ICD-10-CM | POA: Diagnosis not present

## 2023-07-20 DIAGNOSIS — I350 Nonrheumatic aortic (valve) stenosis: Secondary | ICD-10-CM | POA: Diagnosis not present

## 2023-07-20 DIAGNOSIS — Z96612 Presence of left artificial shoulder joint: Secondary | ICD-10-CM | POA: Diagnosis not present

## 2023-07-20 DIAGNOSIS — Z9981 Dependence on supplemental oxygen: Secondary | ICD-10-CM | POA: Diagnosis not present

## 2023-07-20 DIAGNOSIS — Z96641 Presence of right artificial hip joint: Secondary | ICD-10-CM | POA: Diagnosis not present

## 2023-07-20 DIAGNOSIS — C3492 Malignant neoplasm of unspecified part of left bronchus or lung: Secondary | ICD-10-CM | POA: Diagnosis not present

## 2023-07-20 DIAGNOSIS — D509 Iron deficiency anemia, unspecified: Secondary | ICD-10-CM | POA: Diagnosis not present

## 2023-07-20 DIAGNOSIS — I119 Hypertensive heart disease without heart failure: Secondary | ICD-10-CM | POA: Diagnosis not present

## 2023-07-20 DIAGNOSIS — Z86711 Personal history of pulmonary embolism: Secondary | ICD-10-CM | POA: Diagnosis not present

## 2023-07-20 DIAGNOSIS — K5730 Diverticulosis of large intestine without perforation or abscess without bleeding: Secondary | ICD-10-CM | POA: Diagnosis not present

## 2023-07-20 DIAGNOSIS — I3139 Other pericardial effusion (noninflammatory): Secondary | ICD-10-CM | POA: Diagnosis not present

## 2023-07-20 DIAGNOSIS — K219 Gastro-esophageal reflux disease without esophagitis: Secondary | ICD-10-CM | POA: Diagnosis not present

## 2023-07-20 DIAGNOSIS — I2510 Atherosclerotic heart disease of native coronary artery without angina pectoris: Secondary | ICD-10-CM | POA: Diagnosis not present

## 2023-07-20 DIAGNOSIS — Z7901 Long term (current) use of anticoagulants: Secondary | ICD-10-CM | POA: Diagnosis not present

## 2023-07-20 DIAGNOSIS — C7931 Secondary malignant neoplasm of brain: Secondary | ICD-10-CM | POA: Diagnosis not present

## 2023-07-20 DIAGNOSIS — J9621 Acute and chronic respiratory failure with hypoxia: Secondary | ICD-10-CM | POA: Diagnosis not present

## 2023-07-20 DIAGNOSIS — C229 Malignant neoplasm of liver, not specified as primary or secondary: Secondary | ICD-10-CM | POA: Diagnosis not present

## 2023-07-23 ENCOUNTER — Inpatient Hospital Stay

## 2023-07-23 ENCOUNTER — Inpatient Hospital Stay: Admitting: Dietician

## 2023-07-23 ENCOUNTER — Inpatient Hospital Stay: Attending: Neurosurgery

## 2023-07-23 ENCOUNTER — Inpatient Hospital Stay (HOSPITAL_BASED_OUTPATIENT_CLINIC_OR_DEPARTMENT_OTHER): Admitting: Hematology

## 2023-07-23 ENCOUNTER — Ambulatory Visit (HOSPITAL_COMMUNITY)
Admission: RE | Admit: 2023-07-23 | Discharge: 2023-07-23 | Disposition: A | Discharge status: Home / Self Care | Source: Ambulatory Visit | Attending: Hematology | Admitting: Hematology

## 2023-07-23 VITALS — Wt 193.0 lb

## 2023-07-23 VITALS — HR 92

## 2023-07-23 DIAGNOSIS — E119 Type 2 diabetes mellitus without complications: Secondary | ICD-10-CM | POA: Insufficient documentation

## 2023-07-23 DIAGNOSIS — I2699 Other pulmonary embolism without acute cor pulmonale: Secondary | ICD-10-CM | POA: Insufficient documentation

## 2023-07-23 DIAGNOSIS — J90 Pleural effusion, not elsewhere classified: Secondary | ICD-10-CM | POA: Insufficient documentation

## 2023-07-23 DIAGNOSIS — C787 Secondary malignant neoplasm of liver and intrahepatic bile duct: Secondary | ICD-10-CM | POA: Diagnosis not present

## 2023-07-23 DIAGNOSIS — D649 Anemia, unspecified: Secondary | ICD-10-CM | POA: Diagnosis not present

## 2023-07-23 DIAGNOSIS — D329 Benign neoplasm of meninges, unspecified: Secondary | ICD-10-CM | POA: Diagnosis not present

## 2023-07-23 DIAGNOSIS — Z452 Encounter for adjustment and management of vascular access device: Secondary | ICD-10-CM | POA: Diagnosis not present

## 2023-07-23 DIAGNOSIS — R0602 Shortness of breath: Secondary | ICD-10-CM | POA: Insufficient documentation

## 2023-07-23 DIAGNOSIS — C3492 Malignant neoplasm of unspecified part of left bronchus or lung: Secondary | ICD-10-CM

## 2023-07-23 DIAGNOSIS — I517 Cardiomegaly: Secondary | ICD-10-CM | POA: Diagnosis not present

## 2023-07-23 DIAGNOSIS — R918 Other nonspecific abnormal finding of lung field: Secondary | ICD-10-CM | POA: Diagnosis not present

## 2023-07-23 DIAGNOSIS — Z7901 Long term (current) use of anticoagulants: Secondary | ICD-10-CM | POA: Insufficient documentation

## 2023-07-23 DIAGNOSIS — C3412 Malignant neoplasm of upper lobe, left bronchus or lung: Secondary | ICD-10-CM | POA: Diagnosis not present

## 2023-07-23 LAB — CBC WITH DIFFERENTIAL/PLATELET
Abs Immature Granulocytes: 0.01 10*3/uL (ref 0.00–0.07)
Basophils Absolute: 0 10*3/uL (ref 0.0–0.1)
Basophils Relative: 1 %
Eosinophils Absolute: 0.1 10*3/uL (ref 0.0–0.5)
Eosinophils Relative: 2 %
HCT: 32.7 % — ABNORMAL LOW (ref 36.0–46.0)
Hemoglobin: 9.6 g/dL — ABNORMAL LOW (ref 12.0–15.0)
Immature Granulocytes: 0 %
Lymphocytes Relative: 15 %
Lymphs Abs: 0.9 10*3/uL (ref 0.7–4.0)
MCH: 29.3 pg (ref 26.0–34.0)
MCHC: 29.4 g/dL — ABNORMAL LOW (ref 30.0–36.0)
MCV: 99.7 fL (ref 80.0–100.0)
Monocytes Absolute: 0.6 10*3/uL (ref 0.1–1.0)
Monocytes Relative: 10 %
Neutro Abs: 4.5 10*3/uL (ref 1.7–7.7)
Neutrophils Relative %: 72 %
Platelets: 317 10*3/uL (ref 150–400)
RBC: 3.28 MIL/uL — ABNORMAL LOW (ref 3.87–5.11)
RDW: 15.4 % (ref 11.5–15.5)
WBC: 6.1 10*3/uL (ref 4.0–10.5)
nRBC: 0 % (ref 0.0–0.2)

## 2023-07-23 LAB — COMPREHENSIVE METABOLIC PANEL WITH GFR
ALT: 9 U/L (ref 0–44)
AST: 16 U/L (ref 15–41)
Albumin: 2.4 g/dL — ABNORMAL LOW (ref 3.5–5.0)
Alkaline Phosphatase: 92 U/L (ref 38–126)
Anion gap: 11 (ref 5–15)
BUN: 6 mg/dL — ABNORMAL LOW (ref 8–23)
CO2: 34 mmol/L — ABNORMAL HIGH (ref 22–32)
Calcium: 9 mg/dL (ref 8.9–10.3)
Chloride: 95 mmol/L — ABNORMAL LOW (ref 98–111)
Creatinine, Ser: 0.68 mg/dL (ref 0.44–1.00)
GFR, Estimated: >60 mL/min (ref >60)
Glucose, Bld: 110 mg/dL — ABNORMAL HIGH (ref 70–99)
Potassium: 3.4 mmol/L — ABNORMAL LOW (ref 3.5–5.1)
Sodium: 140 mmol/L (ref 135–145)
Total Bilirubin: 0.2 mg/dL (ref 0.0–1.2)
Total Protein: 6.1 g/dL — ABNORMAL LOW (ref 6.5–8.1)

## 2023-07-23 LAB — MAGNESIUM: Magnesium: 1.7 mg/dL (ref 1.7–2.4)

## 2023-07-23 MED ORDER — SODIUM CHLORIDE 0.9% FLUSH
10.0000 mL | INTRAVENOUS | Status: AC
  Administered 2023-07-23: 10 mL via INTRAVENOUS

## 2023-07-23 MED ORDER — HEPARIN SOD (PORK) LOCK FLUSH 100 UNIT/ML IV SOLN
500.0000 [IU] | Freq: Once | INTRAVENOUS | Status: AC
  Administered 2023-07-23: 500 [IU] via INTRAVENOUS

## 2023-07-23 MED ORDER — SODIUM CHLORIDE 0.9% FLUSH
10.0000 mL | Freq: Once | INTRAVENOUS | Status: AC
  Administered 2023-07-23: 10 mL via INTRAVENOUS

## 2023-07-23 NOTE — Progress Notes (Signed)
 [facility] [address] [place] [address]    Clinic Day:  07/23/2023  Referring physician: Wilburn Handler, MD  Patient Care Team: Denise Handler, MD as PCP - General (Family Medicine) Denise Knight, MD as PCP - Cardiology (Cardiology) Denise Boros, MD as Medical Oncologist (Medical Oncology) Denise Formica, MD as Consulting Physician (Pulmonary Disease)   ASSESSMENT & PLAN:   Assessment: 1.  Adenocarcinoma of left lung (HCC) -Chemoradiation therapy with carboplatin  and paclitaxel  from 01/07/2019 through 02/11/2019. -Consolidation immunotherapy with durvalumab  from 03/19/2019 through 04/15/2018, held due to pneumonitis. -CT chest on 07/02/2019 shows left upper lobe lung mass measuring 2.6 x 2.0 cm.  It shows improvement in size. -MRI of the brain on 07/18/2019 showed left sphenoid wing meningioma measuring 2.6 x 2.0 x 2.6 cm unchanged.  No new enhancing intracranial lesion.  Increased left temporal white matter edema. -Durvalumab  restarted on 07/24/2019. -She was evaluated by cardiology with a stress test.  EF was 46%.  She underwent cardiac catheterization which did not show any abnormalities. - 1 year of durvalumab  completed on 07/23/2020. - Liver mass biopsy (08/08/2021): Adenocarcinoma, CK7 positive, negative for TTF-1, Napsin a, GATA3, ER, CK20, CDX2 - PET scan (08/25/2021): Right upper lobe lung nodule 9 mm, SUV 5.0.  Groundglass and solid nodule periphery of the right upper lobe 8 mm, SUV 2.45.  Right lobe liver mass 4.5 x 3.6 cm, SUV 7.78. - NGS testing: PD-L1 negative, TMB-low, MSI-stable, K-ras G12 D.  No other targetable mutations. - Carboplatin , pemetrexed  and pembrolizumab  4 cycles from 09/22/2021 through 11/25/2021, followed by maintenance pemetrexed  and pembrolizumab .  Last pemetrexed  and pembrolizumab  on 11/30/2022.  Pembrolizumab  discontinued due to colitis. -  CT CAP (11/10/2021): Reduced size of liver mass and interval resolution of previously seen  groundglass pulmonary nodules.  There is a new inflammatory right upper lobe nodule.    Plan: 1.  Metastatic adenocarcinoma of the lung to the liver and right lung: - Hospitalized from 3/26 2025 through 3 29 2025 with respiratory symptoms, s/p right thoracentesis on 06/15/2023 with 0.5 L of bloody fluid removed.  Cytology negative for malignancy. - Admitted from 06/24/2023 through 06/27/2023 with acute on chronic respiratory failure. - CT chest (06/24/2023): Malignancy stable.  Stable liver lesions. - She reports that she is feeling washed out today.  Reported decreased eating. - Reviewed labs today: Normal LFTs and creatinine.  CBC with normal white count and platelet count.  Hemoglobin improved to 9.6. - She is using 2 L nasal cannula at rest and increases to 4 L/min on exertion. - Decreased breath sounds at bases.  Will order chest x-ray today.  If there is recurrent effusion, we will schedule her for thoracentesis.  Otherwise I will see her back in 3 weeks for follow-up and possible treatment.    2.  Meningioma: - MRI brain (10/02/2022): Stable left sphenoid wing meningioma.  If she has symptoms, consider reimaging.  3.  Unprovoked pulmonary embolism: - Continue Eliquis  5 mg twice daily indefinitely.  No bleeding issues.   4.  Leg swellings: - Lasix  has been on hold since discharge from the hospital.  Continue wrapping lower extremities.  Albumin is low at 2.4.   5.  Normocytic anemia: - Anemia most likely from myelosuppression.  Hemoglobin today improved to 9.6 from 8.9 previously.   6.  Hypomagnesemia: - Continue magnesium  twice daily.  Magnesium  is 1.7 today.    Orders Placed This Encounter  Procedures   DG Chest 2 View  Standing Status:   Future    Expected Date:   07/23/2023    Expiration Date:   07/22/2024    Reason for Exam (SYMPTOM  OR DIAGNOSIS REQUIRED):   shortness of breath; hx of pleural effusion    Preferred imaging location?:   [facility] R  Teague,acting as a scribe for Denise Boros, MD.,have documented all relevant documentation on the behalf of Denise Boros, MD,as directed by  Denise Boros, MD while in the presence of Denise Boros, MD.  I, Denise Boros MD, have reviewed the above documentation for accuracy and completeness, and I agree with the above.      Denise Boros, MD   5/5/20252:30 PM  CHIEF COMPLAINT:   Diagnosis: stage IV adenocarcinoma of the left lung    Cancer Staging  Adenocarcinoma of left lung [facility]) Staging form: Lung, AJCC 8th Edition - Clinical stage from 01/02/2019: Stage IIIB (cT3, cN2, cM0) - Signed by Denise Boros, MD on 01/02/2019 - Pathologic stage from 09/07/2021: Stage IVA (pTX, pNX, pM1b) - Unsigned    Prior Therapy: 1. Chemoradiation with carboplatin  and paclitaxel  from 01/07/2019 to 02/11/2019. 2. Consolidation with durvalumab  from 03/19/2019 to 04/16/2019, held due to pneumonitis.  Current Therapy:  Pemetrexed  and pembrolizumab  maintenance    HISTORY OF PRESENT ILLNESS:   Oncology History  Adenocarcinoma of left lung (HCC)  12/09/2018 Initial Diagnosis   Adenocarcinoma of left lung (HCC)   01/02/2019 Cancer Staging   Staging form: Lung, AJCC 8th Edition - Clinical stage from 01/02/2019: Stage IIIB (cT3, cN2, cM0) - Signed by Denise Boros, MD on 01/02/2019   01/07/2019 - 02/11/2019 Chemotherapy   The patient had palonosetron  (ALOXI ) injection 0.25 mg, 0.25 mg, Intravenous,  Once, 6 of 6 cycles Administration: 0.25 mg (01/07/2019), 0.25 mg (01/14/2019), 0.25 mg (01/21/2019), 0.25 mg (01/28/2019), 0.25 mg (02/04/2019), 0.25 mg (02/11/2019) CARBOplatin  (PARAPLATIN ) 270 mg in sodium chloride  0.9 % 250 mL chemo infusion, 270 mg (100 % of original dose 266.4 mg), Intravenous,  Once, 6 of 6 cycles Dose modification:   (original dose 266.4 mg, Cycle 1),   (original dose 266.4 mg, Cycle 2),   (original dose 266.4 mg, Cycle 3),    (original dose 266.4 mg, Cycle 4) Administration: 270 mg (01/07/2019), 270 mg (01/14/2019), 270 mg (01/21/2019), 270 mg (01/28/2019), 270 mg (02/04/2019), 270 mg (02/11/2019) PACLitaxel  (TAXOL ) 108 mg in sodium chloride  0.9 % 250 mL chemo infusion (</= 80mg /m2), 45 mg/m2 = 108 mg, Intravenous,  Once, 6 of 6 cycles Administration: 108 mg (01/07/2019), 108 mg (01/14/2019), 108 mg (01/21/2019), 108 mg (01/28/2019), 108 mg (02/04/2019), 108 mg (02/11/2019) fosaprepitant  (EMEND) 150 mg, dexamethasone  (DECADRON ) 12 mg in sodium chloride  0.9 % 145 mL IVPB, , Intravenous,  Once, 5 of 5 cycles Administration:  (01/14/2019),  (01/21/2019),  (01/28/2019),  (02/04/2019),  (02/11/2019)  for chemotherapy treatment.    03/19/2019 - 07/23/2020 Chemotherapy   Patient is on Treatment Plan : LUNG DURVALUMAB  Q14D     09/22/2021 - 11/04/2021 Chemotherapy   Patient is on Treatment Plan : LUNG Carboplatin  (5) + Pemetrexed  (500) + Pembrolizumab  (200) D1 q21d Induction x 4 cycles / Maintenance Pemetrexed  (500) + Pembrolizumab  (200) D1 q21d     09/22/2021 -  Chemotherapy   Patient is on Treatment Plan : LUNG Maintenance Alimta q 21 days        INTERVAL HISTORY:   Denise [name] is a 73 y.o. female presenting to clinic today for follow up of stage IV adenocarcinoma of  the left lung. She was last seen by me on 07/02/23.  Today, she states that she is doing well overall. Her appetite level is at 50%. Her energy level is at 50%.  PAST MEDICAL HISTORY:   Past Medical History: Past Medical History:  Diagnosis Date   Anemia    Aortic stenosis    Arthritis    Cancer (HCC)    lung cancer 2019   Coronary artery calcification seen on CT scan    Dyspnea    Essential hypertension    GERD (gastroesophageal reflux disease)    History of lung cancer    Stage III adenocarcinoma status post chemoradiation   Port-A-Cath in place 01/06/2019   Type 2 diabetes mellitus [facility])     Surgical History: Past Surgical History:  Procedure  Laterality Date   BIOPSY  09/25/2022   Procedure: BIOPSY;  Surgeon: Vinetta Greening, DO;  Location: [facility];  Service: Endoscopy;;   CHOLECYSTECTOMY  1997   COLONOSCOPY     COLONOSCOPY WITH PROPOFOL  N/A 09/25/2022   Procedure: COLONOSCOPY WITH PROPOFOL ;  Surgeon: Vinetta Greening, DO;  Location: [facility];  Service: Endoscopy;  Laterality: N/A;  11:00;ASA 3   ESOPHAGEAL BRUSHING  09/25/2022   Procedure: ESOPHAGEAL BRUSHING;  Surgeon: Vinetta Greening, DO;  Location: [facility];  Service: Endoscopy;;   ESOPHAGOGASTRODUODENOSCOPY (EGD) WITH PROPOFOL  N/A 09/25/2022   Procedure: ESOPHAGOGASTRODUODENOSCOPY (EGD) WITH PROPOFOL ;  Surgeon: Vinetta Greening, DO;  Location: [facility];  Service: Endoscopy;  Laterality: N/A;  11:00AM;ASA 3   GASTRIC BYPASS     INCISIONAL HERNIA REPAIR  04/11/11   IR IMAGING GUIDED PORT INSERTION  12/27/2018   Right   IR US  GUIDE BX ASP/DRAIN  08/08/2021   LAPAROSCOPIC SALPINGOOPHERECTOMY     LAPAROTOMY  04/11/2011   Procedure: EXPLORATORY LAPAROTOMY;  Surgeon: Brandy Cal. Cornett, MD;  Location: [facility];  Service: General;  Laterality: N/A;  closure port hole   POLYPECTOMY  09/25/2022   Procedure: POLYPECTOMY;  Surgeon: Vinetta Greening, DO;  Location: [facility];  Service: Endoscopy;;   RIGHT/LEFT HEART CATH AND CORONARY ANGIOGRAPHY N/A 12/29/2019   Procedure: RIGHT/LEFT HEART CATH AND CORONARY ANGIOGRAPHY;  Surgeon: Millicent Ally, MD;  Location: [facility];  Service: Cardiovascular;  Laterality: N/A;   TOTAL HIP ARTHROPLASTY  03/07/2012   Procedure: TOTAL HIP ARTHROPLASTY ANTERIOR APPROACH;  Surgeon: Bevin Bucks, MD;  Location: [facility];  Service: Orthopedics;  Laterality: Right;   TOTAL SHOULDER ARTHROPLASTY Left 01/21/2015   TOTAL SHOULDER ARTHROPLASTY Left 01/21/2015   Procedure: LEFT TOTAL SHOULDER ARTHROPLASTY;  Surgeon: Ellard Gunning, MD;  Location: [facility];  Service: Orthopedics;  Laterality: Left;   VAGINAL HYSTERECTOMY      Social  History: Social History   Socioeconomic History   Marital status: Single    Spouse name: Not on file   Number of children: Not on file   Years of education: 12th grade   Highest education level: Not on file  Occupational History   Occupation: Employed    Employer: [employer]  Tobacco Use   Smoking status: Former    Current packs/day: 0.00    Average packs/day: 1 pack/day for 12.0 years (12.0 ttl pk-yrs)    Types: Cigarettes    Start date: 03/20/1964    Quit date: 03/20/1976    Years since quitting: 47.3    Passive exposure: Past   Smokeless tobacco: Never  Vaping Use   Vaping status: Never  Used  Substance and Sexual Activity   Alcohol  use: No   Drug use: No   Sexual activity: Not Currently  Other Topics Concern   Not on file  Social History Narrative   Not on file   Social Drivers of Health   Financial Resource Strain: Low Risk  (12/06/2018)   Overall Financial Resource Strain (CARDIA)    Difficulty of Paying Living Expenses: Not very hard  Food Insecurity: No Food Insecurity (06/24/2023)   Hunger Vital Sign    Worried About Running Out of Food in the Last Year: Never true    Ran Out of Food in the Last Year: Never true  Transportation Needs: No Transportation Needs (06/24/2023)   PRAPARE - Administrator, Civil Service (Medical): No    Lack of Transportation (Non-Medical): No  Physical Activity: Inactive (12/06/2018)   Exercise Vital Sign    Days of Exercise per Week: 0 days    Minutes of Exercise per Session: 0 min  Stress: No Stress Concern Present (12/09/2018)   Received from [facility], [facility] Occupational Health - Occupational Stress Questionnaire    Feeling of Stress : Not at all  Social Connections: Unknown (06/24/2023)   Social Connection and Isolation Panel [NHANES]    Frequency of Communication with Friends and Family: More than three times a week    Frequency of Social Gatherings with Friends and  Family: Once a week    Attends Religious Services: More than 4 times per year    Active Member of [employer] or Organizations: No    Attends Banker Meetings: Never    Marital Status: Patient declined  Recent Concern: Social Connections - Moderately Isolated (06/13/2023)   Social Connection and Isolation Panel [NHANES]    Frequency of Communication with Friends and Family: More than three times a week    Frequency of Social Gatherings with Friends and Family: Once a week    Attends Religious Services: More than 4 times per year    Active Member of [employer] or Organizations: No    Attends Banker Meetings: Never    Marital Status: Never married  Intimate Partner Violence: Not At Risk (06/24/2023)   Humiliation, Afraid, Rape, and Kick questionnaire    Fear of Current or Ex-Partner: No    Emotionally Abused: No    Physically Abused: No    Sexually Abused: No    Family History: Family History  Problem Relation Age of Onset   Breast cancer Mother    COPD Mother    Arthritis Mother    Diabetes Mother    Hypertension Mother    Hypertension Father    Diabetes Father    Breast cancer Sister    Thyroid  cancer Brother    Heart attack Brother    Huntington's disease Maternal Grandmother     Current Medications:  Current Outpatient Medications:    acetaminophen  (TYLENOL ) 500 MG tablet, Take 500 mg by mouth every 6 (six) hours as needed for moderate pain., Disp: , Rfl:    albuterol  (PROVENTIL ) (2.5 MG/3ML) 0.083% nebulizer solution, Take 3 mLs (2.5 mg total) by nebulization every 6 (six) hours as needed for wheezing or shortness of breath., Disp: 75 mL, Rfl: 12   albuterol  (VENTOLIN  HFA) 108 (90 Base) MCG/ACT inhaler, Inhale 2 puffs into the lungs every 6 (six) hours as needed for wheezing or shortness of breath., Disp: 6.7 g, Rfl: 0   ALPRAZolam  (XANAX ) 0.25  MG tablet, Take 0.125 mg by mouth daily., Disp: , Rfl:    Calcium  Carbonate Antacid (TUMS PO), Take 2-3 tablets by  mouth daily., Disp: , Rfl:    Cyanocobalamin  (VITAMIN B-12) 2500 MCG SUBL, Place 2,500 mcg under the tongue every morning. , Disp: , Rfl:    ferrous sulfate  325 (65 FE) MG tablet, Take 325 mg by mouth every evening., Disp: , Rfl:    folic acid  (FOLVITE ) 1 MG tablet, TAKE 1 TABLET BY MOUTH EVERY DAY, Disp: 90 tablet, Rfl: 1   gabapentin  (NEURONTIN ) 600 MG tablet, Take 600 mg by mouth at bedtime., Disp: , Rfl:    Glycerin-Hypromellose-PEG 400 (DRY EYE RELIEF DROPS OP), Apply 1 drop to eye daily as needed (dry eyes)., Disp: , Rfl:    lidocaine -prilocaine  (EMLA ) cream, Apply a small amount to port a cath site and cover with plastic wrap 1 hour prior to chemotherapy appointments, Disp: 30 g, Rfl: 3   loteprednol  (LOTEMAX ) 0.5 % ophthalmic suspension, Place 1 drop into both eyes 2 (two) times daily., Disp: , Rfl:    magnesium  oxide (MAG-OX) 400 MG tablet, Take 1 tablet by mouth 2 (two) times daily., Disp: , Rfl:    midodrine  (PROAMATINE ) 10 MG tablet, Take 1 tablet (10 mg total) by mouth 3 (three) times daily with meals., Disp: 90 tablet, Rfl: 2   Multiple Vitamin (MULITIVITAMIN WITH MINERALS) TABS, Take 1 tablet by mouth daily., Disp: , Rfl:    omeprazole (PRILOSEC) 20 MG capsule, Take 20 mg by mouth daily., Disp: , Rfl:    rivaroxaban  (XARELTO ) 20 MG TABS tablet, Take 1 tablet (20 mg total) by mouth daily with supper., Disp: 30 tablet, Rfl: 3 No current facility-administered medications for this visit.  Facility-Administered Medications Ordered in Other Visits:    magnesium  sulfate 2 GM/50ML IVPB, , , ,    Allergies: No Known Allergies  REVIEW OF SYSTEMS:   Review of Systems  Constitutional:  Negative for chills, fatigue and fever.  HENT:   Negative for lump/mass, mouth sores, nosebleeds, sore throat and trouble swallowing.   Eyes:  Negative for eye problems.  Respiratory:  Positive for shortness of breath. Negative for cough.   Cardiovascular:  Negative for chest pain, leg swelling and  palpitations.  Gastrointestinal:  Positive for constipation. Negative for abdominal pain, diarrhea, nausea and vomiting.  Genitourinary:  Negative for bladder incontinence, difficulty urinating, dysuria, frequency, hematuria and nocturia.   Musculoskeletal:  Negative for arthralgias, back pain, flank pain, myalgias and neck pain.  Skin:  Negative for itching and rash.  Neurological:  Positive for numbness. Negative for dizziness and headaches.  Hematological:  Does not bruise/bleed easily.  Psychiatric/Behavioral:  Negative for depression, sleep disturbance and suicidal ideas. The patient is not nervous/anxious.   All other systems reviewed and are negative.    VITALS:   Pulse 92.  Wt Readings from Last 3 Encounters:  07/23/23 193 lb (87.5 kg)  07/02/23 196 lb 1.6 oz (89 kg)  06/27/23 192 lb 0.3 oz (87.1 kg)    There is no height or weight on file to calculate BMI.  Performance status (ECOG): 1 - Symptomatic but completely ambulatory  PHYSICAL EXAM:   Physical Exam Vitals and nursing note reviewed. Exam conducted with a chaperone present.  Constitutional:      Appearance: Normal appearance.  Cardiovascular:     Rate and Rhythm: Normal rate and regular rhythm.     Pulses: Normal pulses.     Heart sounds: Normal heart sounds.  Pulmonary:     Effort: Pulmonary effort is normal.     Breath sounds: Normal breath sounds.     Comments: Decreased breath sounds at bases Abdominal:     Palpations: Abdomen is soft. There is no hepatomegaly, splenomegaly or mass.     Tenderness: There is no abdominal tenderness.  Musculoskeletal:     Right lower leg: Edema present.     Left lower leg: Edema present.  Lymphadenopathy:     Cervical: No cervical adenopathy.     Right cervical: No superficial, deep or posterior cervical adenopathy.    Left cervical: No superficial, deep or posterior cervical adenopathy.     Upper Body:     Right upper body: No supraclavicular or axillary adenopathy.      Left upper body: No supraclavicular or axillary adenopathy.  Neurological:     General: No focal deficit present.     Mental Status: She is alert and oriented to person, place, and time.  Psychiatric:        Mood and Affect: Mood normal.        Behavior: Behavior normal.     LABS:   CBC     Component Value Date/Time   WBC 6.1 07/23/2023 1316   RBC 3.28 (L) 07/23/2023 1316   HGB 9.6 (L) 07/23/2023 1316   HCT 32.7 (L) 07/23/2023 1316   PLT 317 07/23/2023 1316   MCV 99.7 07/23/2023 1316   MCH 29.3 07/23/2023 1316   MCHC 29.4 (L) 07/23/2023 1316   RDW 15.4 07/23/2023 1316   LYMPHSABS 0.9 07/23/2023 1316   MONOABS 0.6 07/23/2023 1316   EOSABS 0.1 07/23/2023 1316   BASOSABS 0.0 07/23/2023 1316    CMP      Component Value Date/Time   NA 140 07/23/2023 1316   K 3.4 (L) 07/23/2023 1316   CL 95 (L) 07/23/2023 1316   CO2 34 (H) 07/23/2023 1316   GLUCOSE 110 (H) 07/23/2023 1316   BUN 6 (L) 07/23/2023 1316   CREATININE 0.68 07/23/2023 1316   CALCIUM  9.0 07/23/2023 1316   PROT 6.1 (L) 07/23/2023 1316   ALBUMIN 2.4 (L) 07/23/2023 1316   AST 16 07/23/2023 1316   ALT 9 07/23/2023 1316   ALKPHOS 92 07/23/2023 1316   BILITOT 0.2 07/23/2023 1316   GFRNONAA >60 07/23/2023 1316   GFRAA >60 12/11/2019 0817     No results found for: "CEA1", "CEA" / No results found for: "CEA1", "CEA" No results found for: "PSA1" No results found for: "OZD664" No results found for: "CAN125"  No results found for: "TOTALPROTELP", "ALBUMINELP", "A1GS", "A2GS", "BETS", "BETA2SER", "GAMS", "MSPIKE", "SPEI" Lab Results  Component Value Date   TIBC 241 (L) 12/20/2022   TIBC 215 (L) 09/18/2022   TIBC 227 (L) 06/05/2022   FERRITIN 715 (H) 12/20/2022   FERRITIN 596 (H) 09/18/2022   FERRITIN 310 (H) 06/05/2022   IRONPCTSAT 12 12/20/2022   IRONPCTSAT 15 09/18/2022   IRONPCTSAT 17 06/05/2022   Lab Results  Component Value Date   LDH 168 06/25/2023   LDH 178 12/29/2022   LDH 185 12/25/2022      STUDIES:   DG CHEST PORT 1 VIEW Result Date: 06/27/2023 CLINICAL DATA:  Dyspnea.  History of lung cancer. EXAM: PORTABLE CHEST 1 VIEW COMPARISON:  June 24, 2023. FINDINGS: Stable cardiomegaly. Status post left shoulder arthroplasty. Right internal jugular Port-A-Cath is unchanged. Stable loculated right pleural effusion is noted with associated right basilar atelectasis or infiltrate. Continued left upper lobe opacity is noted  with pleural based solid component concerning for malignancy given the history of lung cancer. Small left pleural effusion is also noted with associated atelectasis or infiltrate. IMPRESSION: Continued left upper lobe opacity is noted concerning for possible malignancy. Bilateral pleural effusions are again noted with associated atelectasis or infiltrate. Electronically Signed   By: Rosalene Colon M.D.   On: 06/27/2023 12:06   US  CHEST (PLEURAL EFFUSION) Result Date: 06/25/2023 CLINICAL DATA:  Patient with history of metastatic lung cancer, chronic respiratory failure on supplemental oxygen  who was admitted yesterday for worsening dyspnea. CT chest was performed and showed small loculated right and left pleural effusions. Request for thoracentesis. EXAM: CHEST ULTRASOUND COMPARISON:  CT chest w/contrast 06/24/23, US  thoracentesis 06/15/23 FINDINGS: Limited ultrasound of the right and left chest was performed. Right chest with very small, loculated pleural effusion. Left chest with no appreciable pleural effusion. Exam limited by body habitus. IMPRESSION: US  chest significant for: 1. Very small, loculated right pleural effusion which is not amenable to thoracentesis. No procedure was performed. 2.  No appreciable left pleural effusion Electronically Signed   By: Creasie Doctor M.D.   On: 06/25/2023 14:17   CT Chest W Contrast Result Date: 06/24/2023 CLINICAL DATA:  History of lung cancer increased short of breath EXAM: CT CHEST WITH CONTRAST TECHNIQUE: Multidetector CT imaging of the  chest was performed during intravenous contrast administration. RADIATION DOSE REDUCTION: This exam was performed according to the departmental dose-optimization program which includes automated exposure control, adjustment of the mA and/or kV according to patient size and/or use of iterative reconstruction technique. CONTRAST:  75mL OMNIPAQUE  IOHEXOL  300 MG/ML  SOLN COMPARISON:  Chest x-ray 06/24/2023, CT 06/04/2023, 04/19/2023, 07/17/2021, 03/02/2020, PET CT 12/17/2018 FINDINGS: Cardiovascular: Right-sided central venous port with tip at the distal SVC. Moderate aortic atherosclerosis. No aneurysm or dissection. Cardiomegaly. Small pericardial effusion. Aortic valvular calcification. Mediastinum/Nodes: Patent trachea. No thyroid  mass. Subcentimeter mediastinal lymph nodes. Esophagus within normal limits. Lungs/Pleura: Small loculated left pleural effusion without substantial change compared to most recent prior exam from March. Slight interval increase in loculated right pleural effusion compared to most recent prior. Stable post therapeutic distortion and consolidation in the left upper lobe. Otherwise similar pattern of consolidations at the right middle and bilateral lower lobes compared to most recent prior. Similar pattern of perihilar ground-glass density. Upper Abdomen: Hepatic metastatic lesions at the right hepatic lobe with index lesion measuring about 35 x 40 mm, previously 39 x 37 mm. No acute finding. Postsurgical changes of the stomach consistent with prior gastric bypass surgery Musculoskeletal: Generalized subcutaneous edema. No acute or suspicious osseous abnormality. IMPRESSION: 1. Slight interval increase in loculated right pleural effusion compared to most recent prior exam from March. Stable small loculated left pleural effusion. 2. Stable post therapeutic distortion and consolidation in the left upper lobe. Otherwise similar pattern of consolidations at the right middle and bilateral lower  lobes compared to most recent prior. Similar pattern of perihilar ground-glass density. 3. Cardiomegaly with small pericardial effusion. 4. Hepatic metastatic lesions, similar to prior. 5. Generalized subcutaneous edema. 6. Aortic atherosclerosis. Aortic Atherosclerosis (ICD10-I70.0). Electronically Signed   By: Esmeralda Hedge M.D.   On: 06/24/2023 19:05   DG Chest Port 1 View Result Date: 06/24/2023 CLINICAL DATA:  Increasing shortness of breath. History of lung cancer. EXAM: PORTABLE CHEST 1 VIEW COMPARISON:  Radiograph 06/16/2023, chest CT 06/04/2023 FINDINGS: Right chest port in place. Partially loculated right pleural effusion has increased from prior exam. Left pleural effusion is similar. Peripheral left  upper lobe opacity without interval change. No pneumothorax or pulmonary edema. Stable heart size and mediastinal contours. Left shoulder arthroplasty. Advanced right shoulder arthropathy. IMPRESSION: 1. Partially loculated right pleural effusion has increased from prior exam. 2. Left pleural effusion is similar. 3. Peripheral left upper lobe opacity without interval change. Electronically Signed   By: Chadwick Colonel M.D.   On: 06/24/2023 16:28

## 2023-07-23 NOTE — Progress Notes (Signed)
 Patient presents today for Alimta infusion per providers order.  Vital signs and labs reviewed by MD.  No treatment today per MD, patient stopping at Radiology on discharge for chest Xray per MD order.

## 2023-07-23 NOTE — Patient Instructions (Addendum)
 [facility] at [facility] Discharge Instructions   You were seen and examined today by Dr. Cheree Cords.  He reviewed the results of your lab work which are normal/stable.   We will hold your treatment today. We will arrange for you to have a chest x-ray today to see if there is any fluid in the lungs that needs to be pulled off.   Return as scheduled.    Thank you for choosing [facility] at [facility] to provide your oncology and hematology care.  To afford each patient quality time with our provider, please arrive at least 15 minutes before your scheduled appointment time.   If you have a lab appointment with the Cancer Center please come in thru the Main Entrance and check in at the main information desk.  You need to re-schedule your appointment should you arrive 10 or more minutes late.  We strive to give you quality time with our providers, and arriving late affects you and other patients whose appointments are after yours.  Also, if you no show three or more times for appointments you may be dismissed from the clinic at the providers discretion.     Again, thank you for choosing [facility].  Our hope is that these requests will decrease the amount of time that you wait before being seen by our physicians.       _____________________________________________________________  Should you have questions after your visit to [facility], please contact our office at [phone] and follow the prompts.  Our office hours are 8:00 a.m. and 4:30 p.m. Monday - Friday.  Please note that voicemails left after 4:00 p.m. may not be returned until the following business day.  We are closed weekends and major holidays.  You do have access to a nurse 24-7, just call the main number to the clinic [phone] and do not press any options, hold on the line and a nurse will answer the phone.    For prescription refill requests, have  your pharmacy contact our office and allow 72 hours.    Due to Covid, you will need to wear a mask upon entering the hospital. If you do not have a mask, a mask will be given to you at the Main Entrance upon arrival. For doctor visits, patients may have 1 support person age 30 or older with them. For treatment visits, patients can not have anyone with them due to social distancing guidelines and our immunocompromised population.

## 2023-07-23 NOTE — Progress Notes (Signed)
 Scheduled to see patient in infusion for nutrition assessment. Treatment held today. Patient sent for CXR from clinic. Nutrition appointment rescheduled for 6/16 during infusion.

## 2023-07-24 ENCOUNTER — Other Ambulatory Visit: Payer: Self-pay

## 2023-07-25 DIAGNOSIS — C3492 Malignant neoplasm of unspecified part of left bronchus or lung: Secondary | ICD-10-CM | POA: Diagnosis not present

## 2023-07-25 DIAGNOSIS — K5730 Diverticulosis of large intestine without perforation or abscess without bleeding: Secondary | ICD-10-CM | POA: Diagnosis not present

## 2023-07-25 DIAGNOSIS — K219 Gastro-esophageal reflux disease without esophagitis: Secondary | ICD-10-CM | POA: Diagnosis not present

## 2023-07-25 DIAGNOSIS — Z86711 Personal history of pulmonary embolism: Secondary | ICD-10-CM | POA: Diagnosis not present

## 2023-07-25 DIAGNOSIS — I2510 Atherosclerotic heart disease of native coronary artery without angina pectoris: Secondary | ICD-10-CM | POA: Diagnosis not present

## 2023-07-25 DIAGNOSIS — Z96641 Presence of right artificial hip joint: Secondary | ICD-10-CM | POA: Diagnosis not present

## 2023-07-25 DIAGNOSIS — I700 Atherosclerosis of aorta: Secondary | ICD-10-CM | POA: Diagnosis not present

## 2023-07-25 DIAGNOSIS — E119 Type 2 diabetes mellitus without complications: Secondary | ICD-10-CM | POA: Diagnosis not present

## 2023-07-25 DIAGNOSIS — I119 Hypertensive heart disease without heart failure: Secondary | ICD-10-CM | POA: Diagnosis not present

## 2023-07-25 DIAGNOSIS — J9621 Acute and chronic respiratory failure with hypoxia: Secondary | ICD-10-CM | POA: Diagnosis not present

## 2023-07-25 DIAGNOSIS — C229 Malignant neoplasm of liver, not specified as primary or secondary: Secondary | ICD-10-CM | POA: Diagnosis not present

## 2023-07-25 DIAGNOSIS — D509 Iron deficiency anemia, unspecified: Secondary | ICD-10-CM | POA: Diagnosis not present

## 2023-07-25 DIAGNOSIS — Z7901 Long term (current) use of anticoagulants: Secondary | ICD-10-CM | POA: Diagnosis not present

## 2023-07-25 DIAGNOSIS — I4891 Unspecified atrial fibrillation: Secondary | ICD-10-CM | POA: Diagnosis not present

## 2023-07-25 DIAGNOSIS — D630 Anemia in neoplastic disease: Secondary | ICD-10-CM | POA: Diagnosis not present

## 2023-07-25 DIAGNOSIS — Z9181 History of falling: Secondary | ICD-10-CM | POA: Diagnosis not present

## 2023-07-25 DIAGNOSIS — I3139 Other pericardial effusion (noninflammatory): Secondary | ICD-10-CM | POA: Diagnosis not present

## 2023-07-25 DIAGNOSIS — Z96612 Presence of left artificial shoulder joint: Secondary | ICD-10-CM | POA: Diagnosis not present

## 2023-07-25 DIAGNOSIS — C7931 Secondary malignant neoplasm of brain: Secondary | ICD-10-CM | POA: Diagnosis not present

## 2023-07-25 DIAGNOSIS — J9811 Atelectasis: Secondary | ICD-10-CM | POA: Diagnosis not present

## 2023-07-25 DIAGNOSIS — Z9981 Dependence on supplemental oxygen: Secondary | ICD-10-CM | POA: Diagnosis not present

## 2023-07-25 DIAGNOSIS — I350 Nonrheumatic aortic (valve) stenosis: Secondary | ICD-10-CM | POA: Diagnosis not present

## 2023-07-27 DIAGNOSIS — Z7901 Long term (current) use of anticoagulants: Secondary | ICD-10-CM | POA: Diagnosis not present

## 2023-07-27 DIAGNOSIS — E119 Type 2 diabetes mellitus without complications: Secondary | ICD-10-CM | POA: Diagnosis not present

## 2023-07-27 DIAGNOSIS — I2510 Atherosclerotic heart disease of native coronary artery without angina pectoris: Secondary | ICD-10-CM | POA: Diagnosis not present

## 2023-07-27 DIAGNOSIS — J9811 Atelectasis: Secondary | ICD-10-CM | POA: Diagnosis not present

## 2023-07-27 DIAGNOSIS — Z96641 Presence of right artificial hip joint: Secondary | ICD-10-CM | POA: Diagnosis not present

## 2023-07-27 DIAGNOSIS — Z9981 Dependence on supplemental oxygen: Secondary | ICD-10-CM | POA: Diagnosis not present

## 2023-07-27 DIAGNOSIS — I119 Hypertensive heart disease without heart failure: Secondary | ICD-10-CM | POA: Diagnosis not present

## 2023-07-27 DIAGNOSIS — Z86711 Personal history of pulmonary embolism: Secondary | ICD-10-CM | POA: Diagnosis not present

## 2023-07-27 DIAGNOSIS — C229 Malignant neoplasm of liver, not specified as primary or secondary: Secondary | ICD-10-CM | POA: Diagnosis not present

## 2023-07-27 DIAGNOSIS — K219 Gastro-esophageal reflux disease without esophagitis: Secondary | ICD-10-CM | POA: Diagnosis not present

## 2023-07-27 DIAGNOSIS — Z96612 Presence of left artificial shoulder joint: Secondary | ICD-10-CM | POA: Diagnosis not present

## 2023-07-27 DIAGNOSIS — C3492 Malignant neoplasm of unspecified part of left bronchus or lung: Secondary | ICD-10-CM | POA: Diagnosis not present

## 2023-07-27 DIAGNOSIS — I700 Atherosclerosis of aorta: Secondary | ICD-10-CM | POA: Diagnosis not present

## 2023-07-27 DIAGNOSIS — D630 Anemia in neoplastic disease: Secondary | ICD-10-CM | POA: Diagnosis not present

## 2023-07-27 DIAGNOSIS — Z9181 History of falling: Secondary | ICD-10-CM | POA: Diagnosis not present

## 2023-07-27 DIAGNOSIS — J9621 Acute and chronic respiratory failure with hypoxia: Secondary | ICD-10-CM | POA: Diagnosis not present

## 2023-07-27 DIAGNOSIS — I3139 Other pericardial effusion (noninflammatory): Secondary | ICD-10-CM | POA: Diagnosis not present

## 2023-07-27 DIAGNOSIS — D509 Iron deficiency anemia, unspecified: Secondary | ICD-10-CM | POA: Diagnosis not present

## 2023-07-27 DIAGNOSIS — I350 Nonrheumatic aortic (valve) stenosis: Secondary | ICD-10-CM | POA: Diagnosis not present

## 2023-07-27 DIAGNOSIS — C7931 Secondary malignant neoplasm of brain: Secondary | ICD-10-CM | POA: Diagnosis not present

## 2023-07-27 DIAGNOSIS — K5730 Diverticulosis of large intestine without perforation or abscess without bleeding: Secondary | ICD-10-CM | POA: Diagnosis not present

## 2023-07-27 DIAGNOSIS — I4891 Unspecified atrial fibrillation: Secondary | ICD-10-CM | POA: Diagnosis not present

## 2023-07-31 DIAGNOSIS — Z96612 Presence of left artificial shoulder joint: Secondary | ICD-10-CM | POA: Diagnosis not present

## 2023-07-31 DIAGNOSIS — C7931 Secondary malignant neoplasm of brain: Secondary | ICD-10-CM | POA: Diagnosis not present

## 2023-07-31 DIAGNOSIS — J9811 Atelectasis: Secondary | ICD-10-CM | POA: Diagnosis not present

## 2023-07-31 DIAGNOSIS — Z9181 History of falling: Secondary | ICD-10-CM | POA: Diagnosis not present

## 2023-07-31 DIAGNOSIS — Z96641 Presence of right artificial hip joint: Secondary | ICD-10-CM | POA: Diagnosis not present

## 2023-07-31 DIAGNOSIS — I119 Hypertensive heart disease without heart failure: Secondary | ICD-10-CM | POA: Diagnosis not present

## 2023-07-31 DIAGNOSIS — C229 Malignant neoplasm of liver, not specified as primary or secondary: Secondary | ICD-10-CM | POA: Diagnosis not present

## 2023-07-31 DIAGNOSIS — I3139 Other pericardial effusion (noninflammatory): Secondary | ICD-10-CM | POA: Diagnosis not present

## 2023-07-31 DIAGNOSIS — D509 Iron deficiency anemia, unspecified: Secondary | ICD-10-CM | POA: Diagnosis not present

## 2023-07-31 DIAGNOSIS — I2510 Atherosclerotic heart disease of native coronary artery without angina pectoris: Secondary | ICD-10-CM | POA: Diagnosis not present

## 2023-07-31 DIAGNOSIS — J9621 Acute and chronic respiratory failure with hypoxia: Secondary | ICD-10-CM | POA: Diagnosis not present

## 2023-07-31 DIAGNOSIS — Z9981 Dependence on supplemental oxygen: Secondary | ICD-10-CM | POA: Diagnosis not present

## 2023-07-31 DIAGNOSIS — E119 Type 2 diabetes mellitus without complications: Secondary | ICD-10-CM | POA: Diagnosis not present

## 2023-07-31 DIAGNOSIS — Z7901 Long term (current) use of anticoagulants: Secondary | ICD-10-CM | POA: Diagnosis not present

## 2023-07-31 DIAGNOSIS — D630 Anemia in neoplastic disease: Secondary | ICD-10-CM | POA: Diagnosis not present

## 2023-07-31 DIAGNOSIS — I4891 Unspecified atrial fibrillation: Secondary | ICD-10-CM | POA: Diagnosis not present

## 2023-07-31 DIAGNOSIS — K5730 Diverticulosis of large intestine without perforation or abscess without bleeding: Secondary | ICD-10-CM | POA: Diagnosis not present

## 2023-07-31 DIAGNOSIS — C3492 Malignant neoplasm of unspecified part of left bronchus or lung: Secondary | ICD-10-CM | POA: Diagnosis not present

## 2023-07-31 DIAGNOSIS — I700 Atherosclerosis of aorta: Secondary | ICD-10-CM | POA: Diagnosis not present

## 2023-07-31 DIAGNOSIS — Z86711 Personal history of pulmonary embolism: Secondary | ICD-10-CM | POA: Diagnosis not present

## 2023-07-31 DIAGNOSIS — I350 Nonrheumatic aortic (valve) stenosis: Secondary | ICD-10-CM | POA: Diagnosis not present

## 2023-07-31 DIAGNOSIS — K219 Gastro-esophageal reflux disease without esophagitis: Secondary | ICD-10-CM | POA: Diagnosis not present

## 2023-08-01 DIAGNOSIS — Z96612 Presence of left artificial shoulder joint: Secondary | ICD-10-CM | POA: Diagnosis not present

## 2023-08-01 DIAGNOSIS — I350 Nonrheumatic aortic (valve) stenosis: Secondary | ICD-10-CM | POA: Diagnosis not present

## 2023-08-01 DIAGNOSIS — I700 Atherosclerosis of aorta: Secondary | ICD-10-CM | POA: Diagnosis not present

## 2023-08-01 DIAGNOSIS — C7931 Secondary malignant neoplasm of brain: Secondary | ICD-10-CM | POA: Diagnosis not present

## 2023-08-01 DIAGNOSIS — K219 Gastro-esophageal reflux disease without esophagitis: Secondary | ICD-10-CM | POA: Diagnosis not present

## 2023-08-01 DIAGNOSIS — D630 Anemia in neoplastic disease: Secondary | ICD-10-CM | POA: Diagnosis not present

## 2023-08-01 DIAGNOSIS — Z86711 Personal history of pulmonary embolism: Secondary | ICD-10-CM | POA: Diagnosis not present

## 2023-08-01 DIAGNOSIS — Z9981 Dependence on supplemental oxygen: Secondary | ICD-10-CM | POA: Diagnosis not present

## 2023-08-01 DIAGNOSIS — Z9181 History of falling: Secondary | ICD-10-CM | POA: Diagnosis not present

## 2023-08-01 DIAGNOSIS — C229 Malignant neoplasm of liver, not specified as primary or secondary: Secondary | ICD-10-CM | POA: Diagnosis not present

## 2023-08-01 DIAGNOSIS — E119 Type 2 diabetes mellitus without complications: Secondary | ICD-10-CM | POA: Diagnosis not present

## 2023-08-01 DIAGNOSIS — Z7901 Long term (current) use of anticoagulants: Secondary | ICD-10-CM | POA: Diagnosis not present

## 2023-08-01 DIAGNOSIS — I3139 Other pericardial effusion (noninflammatory): Secondary | ICD-10-CM | POA: Diagnosis not present

## 2023-08-01 DIAGNOSIS — C3492 Malignant neoplasm of unspecified part of left bronchus or lung: Secondary | ICD-10-CM | POA: Diagnosis not present

## 2023-08-01 DIAGNOSIS — D509 Iron deficiency anemia, unspecified: Secondary | ICD-10-CM | POA: Diagnosis not present

## 2023-08-01 DIAGNOSIS — J9811 Atelectasis: Secondary | ICD-10-CM | POA: Diagnosis not present

## 2023-08-01 DIAGNOSIS — K5730 Diverticulosis of large intestine without perforation or abscess without bleeding: Secondary | ICD-10-CM | POA: Diagnosis not present

## 2023-08-01 DIAGNOSIS — I119 Hypertensive heart disease without heart failure: Secondary | ICD-10-CM | POA: Diagnosis not present

## 2023-08-01 DIAGNOSIS — I2510 Atherosclerotic heart disease of native coronary artery without angina pectoris: Secondary | ICD-10-CM | POA: Diagnosis not present

## 2023-08-01 DIAGNOSIS — Z96641 Presence of right artificial hip joint: Secondary | ICD-10-CM | POA: Diagnosis not present

## 2023-08-01 DIAGNOSIS — J9621 Acute and chronic respiratory failure with hypoxia: Secondary | ICD-10-CM | POA: Diagnosis not present

## 2023-08-01 DIAGNOSIS — I4891 Unspecified atrial fibrillation: Secondary | ICD-10-CM | POA: Diagnosis not present

## 2023-08-05 NOTE — Progress Notes (Signed)
 Denise [name], female    DOB: 05/13/1950    MRN: [id number]   Brief patient profile:  34   yobf  doe x 2023 quit smoking 1978   referred to pulmonary clinic in [place]  08/09/2023 by Dr Pegge Bow  for f/u effusions R > L during admit:    Admit date:     06/24/2023  Discharge date: 06/27/23   Recommendations at discharge:  Repeat basic metabolic panel to follow ultralights and renal function Repeat CBC to follow hemoglobin trend/stability Repeat chest x-ray in 4 weeks to assure resolution of infiltrates and follow-up on pleural effusion reaccumulation.   Discharge Diagnoses: Principal Problem:   Acute on chronic respiratory failure (HCC)   Atrial fibrillation with RVR (HCC)   Loculated pleural effusion   Port-A-Cath in place   Pulmonary embolus (HCC)   Type 2 diabetes mellitus (HCC)   Essential hypertension   Aortic stenosis   Adenocarcinoma of left lung (HCC)   Iron deficiency anemia   Hypotension   Brief Hospital admission narrative: As per H&P written by Dr. Quintella Buck on 06/24/2023 Denise [name] is a 73 y.o. female with extensive medical history significant for left lung adenocarcinoma with brain metastasis, chronic respiratory failure on 2 to 4 L, atrial fibrillation, aortic stenosis, hypertension, pulmonary embolism diabetes mellitus. Recent hospitalization 3/26 to 3/29 for acute hypoxic respiratory failure secondary to right pleural effusion, atrial fibrillation with RVR.  She underwent thoracentesis 3/28 with drainage of 550 mL of dark red colored pleural fluid.  Pathology results-no malignant cells identified, cultures and Gram stain were negative.   She presents today with recurrence of her difficulty breathing over the past 2 days.  She denies swelling to her lower extremities.  She reports compliance with her Lasix  and Xarelto .  No chest pain.  No cough.  No fevers no chills.  She denies palpitations, no chest pain.   ED Course: Temperature 98.9.  Heart rate 93-50.   Respiratory rate 24-31.  Blood pressure systolic down to 82/95 improved to 143/116.  O2 sats down to 86% on home 4 L, on 5 L sats > 94%. WBC 9.  BNP 406.  Troponin 19.  COVID influenza RSV negative. EDP reports rhonchi on exam. CT chest with contrast showed slight interval increase in loculated right pleural effusion compared to most recent prior exam.  Able small loculated left pleural effusion.  Also shows similar pattern of consolidations left upper lobe, right middle and bilateral lower lobes compared to recent.  Similar perihilar groundglass density.   Assessment and Plan: 1)Acute on chronic respiratory failure-- She reports compliance with Xarelto .  - CT chest with contrast shows slight increase in loculated right pleural effusion.   - Recent hospitalization, patient underwent thoracentesis 3/28-with drainage of of pleural fluid, pathology was negative for malignant cells, cultures and gram stain negative -ABG-pH of 7.46, pCO2 of 56. -COVID influenza RSV negative WBC 9.0 >>11.4 -Patient antibiotics transition to oral route and planning to pursue 5 more days at discharge -Continue as needed bronchodilator management and oxygen  supplementation -Afebrile and feeling ready to go home.   2)Rt Sided Loculated pleural effusion CT with contrast  slight interval increase in loculated right pleural effusion compared to most recent prior exam from March. Stable small loculated left pleural effusion.  Right effusion was drained during recent hospitalization 06/15/23 -Repeat ultrasound-guided thoracentesis on 06/25/2023 with insufficient fluid for thoracentensis -IR consulted input appreciated; not enough fluid for thoracentesis at this time.   3)Atrial fibrillation with RVR ---rate  control remains challenging -Echo from 12/25/2022 with EF of 65 to 70%  -Continue treatment with extended release Cardizem  and the use of Xarelto  -Coreg  has been discontinued due to ongoing hypotension.   4)DM2-A1c  5.8 reflecting excellent diabetic control PTA Diet controlled and outpatient. --Monitor fingersticks   5)H/o Pulmonary embolus (HCC) -Will continue treatment with Xarelto    6)H/o HTN/hypotension -Currently hypotensive---a.m. cortisol is 11.5 -Continue extended release Cardizem  -Continue treatment with midodrine  10 mg by mouth 3 times a day -Continue to follow blood pressure.   7)Aortic stenosis -Last echo 12/2022-aortic valve appear significantly stenotic.  Per cardiology notes 02/2023, patient not felt to be a candidate for TAVR given metastatic lung cancer. -Continue patient follow-up with cardiology service.   Iron deficiency anemia -Hemoglobin stable at 8.7. -Continue to follow hemoglobin trend. -No transfusion required throughout hospitalization.   Adenocarcinoma of left lung [facility]) Continue outpatient follow-up with Dr. Cheree Cords.  Last visit 06/07/2023, tolerated last treatment well.   -Her next chemo session is scheduled for 07/02/2023 -Patient also scheduled to follow-up as an outpatient with pulmonology service.   Generalized weakness and ambulatory dysfunction--- patient family members are worried that she may be too weak to return home; patient has declined skilled nursing facility placement. -In agreement to go home with home health services (home health RN, PT and aide has been arranged).   Class II obesity -Body mass index is 35.12 kg/m.  -Low-calorie diet, portion control and increasing activity discussed with patient.  07/23/23 oncology note 1.  Adenocarcinoma of left lung (HCC) -Chemoradiation therapy with carboplatin  and paclitaxel  from 01/07/2019 through 02/11/2019. -Consolidation immunotherapy with durvalumab  from 03/19/2019 through 04/15/2018, held due to pneumonitis. -CT chest on 07/02/2019 shows left upper lobe lung mass measuring 2.6 x 2.0 cm.  It shows improvement in size. -MRI of the brain on 07/18/2019 showed left sphenoid wing meningioma measuring 2.6 x  2.0 x 2.6 cm unchanged.  No new enhancing intracranial lesion.  Increased left temporal white matter edema. -Durvalumab  restarted on 07/24/2019. -She was evaluated by cardiology with a stress test.  EF was 46%.  She underwent cardiac catheterization which did not show any abnormalities. - 1 year of durvalumab  completed on 07/23/2020. - Liver mass biopsy (08/08/2021): Adenocarcinoma, CK7 positive, negative for TTF-1, Napsin a, GATA3, ER, CK20, CDX2 - PET scan (08/25/2021): Right upper lobe lung nodule 9 mm, SUV 5.0.  Groundglass and solid nodule periphery of the right upper lobe 8 mm, SUV 2.45.  Right lobe liver mass 4.5 x 3.6 cm, SUV 7.78. - NGS testing: PD-L1 negative, TMB-low, MSI-stable, K-ras G12 D.  No other targetable mutations. - Carboplatin , pemetrexed  and pembrolizumab  4 cycles from 09/22/2021 through 11/25/2021, followed by maintenance pemetrexed  and pembrolizumab .  Last pemetrexed  and pembrolizumab  on 11/30/2022.  Pembrolizumab  discontinued due to colitis. -  CT CAP (11/10/2021): Reduced size of liver mass and interval resolution of previously seen groundglass pulmonary nodules.  There is a new inflammatory right upper lobe nodule.  History of Present Illness  08/09/2023  Pulmonary/ 1st office eval/ Denise [name] / [facility]  Chief Complaint  Patient presents with   Establish Care   Shortness of Breath  Dyspnea:  bed to w/c or bsc Cough: none  Sleep: lift chair x 45 degree and no noct resp cc SABA use: once a day / once a day  02: 2 lpm / up to 4lpm walking    No obvious day to day or daytime pattern/variability or assoc excess/ purulent sputum or mucus plugs or hemoptysis or  cp or chest tightness, subjective wheeze or overt sinus or hb symptoms.    Also denies any obvious fluctuation of symptoms with weather or environmental changes or other aggravating or alleviating factors except as outlined above   No unusual exposure hx or h/o childhood pna/ asthma or knowledge of premature  birth.  Current Allergies, Complete Past Medical History, Past Surgical History, Family History, and Social History were reviewed in [employer] record.  ROS  The following are not active complaints unless bolded Hoarseness, sore throat, dysphagia, dental problems, itching, sneezing,  nasal congestion or discharge of excess mucus or purulent secretions, ear ache,   fever, chills, sweats, unintended wt loss or wt gain, classically pleuritic or exertional cp,  orthopnea pnd or arm/hand swelling  or leg swelling, presyncope, palpitations, abdominal pain, anorexia, nausea, vomiting, diarrhea  or change in bowel habits or change in bladder habits, change in stools or change in urine, dysuria, hematuria,  rash, arthralgias, visual complaints, headache, numbness, weakness or ataxia or problems with walking or coordination,  change in mood or  memory.          Meds: Not sure of meds    Past Medical History:  Diagnosis Date   Anemia    Aortic stenosis    Arthritis    Cancer (HCC)    lung cancer 2019   Coronary artery calcification seen on CT scan    Dyspnea    Essential hypertension    GERD (gastroesophageal reflux disease)    History of lung cancer    Stage III adenocarcinoma status post chemoradiation   Port-A-Cath in place 01/06/2019   Type 2 diabetes mellitus (HCC)       Objective:     BP 109/63 (BP Location: Right Arm)   Pulse (!) 121   Ht 5\' 2"  (1.575 m)   Wt 193 lb (87.5 kg)   SpO2 (!) 88% Comment: 2 L POC  BMI 35.30 kg/m   SpO2: (!) 88 % (2 L POC)   W/c bound chronically ill bf nad / confused with details of care/ names of meds    HEENT : Oropharynx  clear      Nasal turbinates nl    NECK :  without  apparent JVD/ palpable Nodes/TM    LUNGS: no acc muscle use,  Nl contour chest with decreased BS / dulnness both bases    CV:  RRR  no s3 3/6 SEM s increase in P2, and no edema   ABD:  soft and nontender   MS:     ext warm without  deformities Or obvious joint restrictions  calf tenderness, cyanosis or clubbing    SKIN: warm and dry without lesions    NEURO:  alert, approp, nl sensorium with  no motor or cerebellar deficits apparent.     Cxr 08/09/2023 declined, says feels same as did 07/23/23 "[birth year] my [birth year]"  I personally reviewed images and agree with radiology impression as follows:   Cxr 07/23/23 No change in the appearance of the chest x-ray, with loculated pleural fluid at the right greater than left lung base, post treatment changes of the left lung apex, and cardiomegaly My review: very small lung volumes with bilateral loculated effusions and mild kyphosis as well         Assessment   DOE (dyspnea on exertion) Onset around Jan 1st 2021 / slowly progressive Echo 05/15/19 Left ventricular ejection fraction, by estimation, is 60 to 65%. The  left ventricle has  normal function. The left ventricle has no regional  wall motion abnormalities. There is moderate left ventricular hypertrophy.  Left ventricular diastolic  parameters are consistent with Grade I diastolic dysfunction (impaired  relaxation). Elevated left atrial pressure.   2. Right ventricular systolic function is normal. The right ventricular  size is normal. There is normal pulmonary artery systolic pressure.   3. Left atrial size was moderately dilated.   4. The mitral valve is normal in structure and function. No evidence of  mitral valve regurgitation. No evidence of mitral stenosis.   5. The aortic valve is tricuspid. Aortic valve regurgitation is mild.  Moderate aortic valve stenosis.Aortic valve mean gradient measures 21.0  mmHg. Aortic valve peak gradient measures 36.2 mmHg. Aortic valve area, by  VTI measures 1.27 cm   6. The inferior vena cava is normal in size with greater than 50%  respiratory variability, suggesting right atrial pressure of 3 mmHg.   7. Cannot exclude small PFO with left to right shunt.  -  10/09/2019    Walked RA  approx   200 ft  @ moderate pace  stopped due to  Sob with sats 92%     Multifactorial but mostly based on restrictive physiology due to obesity  and bilateral loculated pleural effusions  which radiographically and clinically are unchanged since last admit and nothing to suggest for now other than making sure her 02 sats stay above 90% given that she has at least mod AS with limited cardiac reserve.  Chronic respiratory failure with hypoxia and hypercapnia (HCC) HC03   07/23/23  =   34   Hypercarbia likely from obesity/ bilateral effusions.  Rec Titrate 02 flow to  to keep sats low 90s by pulse ox at home > call if 02 supply not adequate.  Consider shift palliative care/ hospice rather than escalating pulmonary interventions but defer this call to Dr Linnell Richardson.   F/u  6 weeks with all meds in hand using a trust but verify approach to confirm accurate Medication  Reconciliation The principal here is that until we are certain that the  patients are doing what we've asked, it makes no sense to ask them to do more.    Outpatient Encounter Medications as of 08/09/2023 - not able to verify this list is correct  - asked pt to do med reconciliation at home, showed her how to do it "like balancing a checkbook" and call back with discrepancies   Medication Sig   acetaminophen  (TYLENOL ) 500 MG tablet Take 500 mg by mouth every 6 (six) hours as needed for moderate pain.   albuterol  (PROVENTIL ) (2.5 MG/3ML) 0.083% nebulizer solution Take 3 mLs (2.5 mg total) by nebulization every 6 (six) hours as needed for wheezing or shortness of breath.   albuterol  (VENTOLIN  HFA) 108 (90 Base) MCG/ACT inhaler Inhale 2 puffs into the lungs every 6 (six) hours as needed for wheezing or shortness of breath.   ALPRAZolam  (XANAX ) 0.25 MG tablet Take 0.125 mg by mouth daily.   Calcium  Carbonate Antacid (TUMS PO) Take 2-3 tablets by mouth daily.   Cyanocobalamin  (VITAMIN B-12) 2500 MCG SUBL Place 2,500 mcg under the tongue  every morning.    diltiazem  (CARDIZEM  CD) 120 MG 24 hr capsule Take 120 mg by mouth daily.   ferrous sulfate  325 (65 FE) MG tablet Take 325 mg by mouth every evening.   folic acid  (FOLVITE ) 1 MG tablet TAKE 1 TABLET BY MOUTH EVERY DAY   furosemide  (LASIX ) 20 MG tablet  Take 40 mg by mouth daily.   gabapentin  (NEURONTIN ) 300 MG capsule Take 300 mg by mouth at bedtime.   Glycerin-Hypromellose-PEG 400 (DRY EYE RELIEF DROPS OP) Apply 1 drop to eye daily as needed (dry eyes).   lidocaine -prilocaine  (EMLA ) cream Apply a small amount to port a cath site and cover with plastic wrap 1 hour prior to chemotherapy appointments   loteprednol  (LOTEMAX ) 0.5 % ophthalmic suspension Place 1 drop into both eyes 2 (two) times daily.   magnesium  oxide (MAG-OX) 400 MG tablet Take 1 tablet by mouth 2 (two) times daily.   midodrine  (PROAMATINE ) 5 MG tablet Take 5 mg by mouth 3 (three) times daily.   Multiple Vitamin (MULITIVITAMIN WITH MINERALS) TABS Take 1 tablet by mouth daily.   omeprazole (PRILOSEC) 20 MG capsule Take 20 mg by mouth daily.   rivaroxaban  (XARELTO ) 20 MG TABS tablet Take 1 tablet (20 mg total) by mouth daily with supper.   [DISCONTINUED] gabapentin  (NEURONTIN ) 600 MG tablet Take 600 mg by mouth at bedtime.   [DISCONTINUED] midodrine  (PROAMATINE ) 10 MG tablet Take 1 tablet (10 mg total) by mouth 3 (three) times daily with meals.   Facility-Administered Encounter Medications as of 08/09/2023  Medication   magnesium  sulfate 2 GM/50ML IVPB      Vernestine Gondola, MD 08/09/2023

## 2023-08-07 DIAGNOSIS — I2699 Other pulmonary embolism without acute cor pulmonale: Secondary | ICD-10-CM | POA: Diagnosis not present

## 2023-08-07 DIAGNOSIS — I10 Essential (primary) hypertension: Secondary | ICD-10-CM | POA: Diagnosis not present

## 2023-08-07 DIAGNOSIS — T148XXA Other injury of unspecified body region, initial encounter: Secondary | ICD-10-CM | POA: Diagnosis not present

## 2023-08-07 DIAGNOSIS — E119 Type 2 diabetes mellitus without complications: Secondary | ICD-10-CM | POA: Diagnosis not present

## 2023-08-08 DIAGNOSIS — Z86711 Personal history of pulmonary embolism: Secondary | ICD-10-CM | POA: Diagnosis not present

## 2023-08-08 DIAGNOSIS — I2510 Atherosclerotic heart disease of native coronary artery without angina pectoris: Secondary | ICD-10-CM | POA: Diagnosis not present

## 2023-08-08 DIAGNOSIS — D630 Anemia in neoplastic disease: Secondary | ICD-10-CM | POA: Diagnosis not present

## 2023-08-08 DIAGNOSIS — C7931 Secondary malignant neoplasm of brain: Secondary | ICD-10-CM | POA: Diagnosis not present

## 2023-08-08 DIAGNOSIS — E119 Type 2 diabetes mellitus without complications: Secondary | ICD-10-CM | POA: Diagnosis not present

## 2023-08-08 DIAGNOSIS — I4891 Unspecified atrial fibrillation: Secondary | ICD-10-CM | POA: Diagnosis not present

## 2023-08-08 DIAGNOSIS — I119 Hypertensive heart disease without heart failure: Secondary | ICD-10-CM | POA: Diagnosis not present

## 2023-08-08 DIAGNOSIS — Z96612 Presence of left artificial shoulder joint: Secondary | ICD-10-CM | POA: Diagnosis not present

## 2023-08-08 DIAGNOSIS — C229 Malignant neoplasm of liver, not specified as primary or secondary: Secondary | ICD-10-CM | POA: Diagnosis not present

## 2023-08-08 DIAGNOSIS — I3139 Other pericardial effusion (noninflammatory): Secondary | ICD-10-CM | POA: Diagnosis not present

## 2023-08-08 DIAGNOSIS — Z96641 Presence of right artificial hip joint: Secondary | ICD-10-CM | POA: Diagnosis not present

## 2023-08-08 DIAGNOSIS — J9621 Acute and chronic respiratory failure with hypoxia: Secondary | ICD-10-CM | POA: Diagnosis not present

## 2023-08-08 DIAGNOSIS — D509 Iron deficiency anemia, unspecified: Secondary | ICD-10-CM | POA: Diagnosis not present

## 2023-08-08 DIAGNOSIS — K219 Gastro-esophageal reflux disease without esophagitis: Secondary | ICD-10-CM | POA: Diagnosis not present

## 2023-08-08 DIAGNOSIS — Z9181 History of falling: Secondary | ICD-10-CM | POA: Diagnosis not present

## 2023-08-08 DIAGNOSIS — K5730 Diverticulosis of large intestine without perforation or abscess without bleeding: Secondary | ICD-10-CM | POA: Diagnosis not present

## 2023-08-08 DIAGNOSIS — I700 Atherosclerosis of aorta: Secondary | ICD-10-CM | POA: Diagnosis not present

## 2023-08-08 DIAGNOSIS — Z9981 Dependence on supplemental oxygen: Secondary | ICD-10-CM | POA: Diagnosis not present

## 2023-08-08 DIAGNOSIS — I350 Nonrheumatic aortic (valve) stenosis: Secondary | ICD-10-CM | POA: Diagnosis not present

## 2023-08-08 DIAGNOSIS — Z7901 Long term (current) use of anticoagulants: Secondary | ICD-10-CM | POA: Diagnosis not present

## 2023-08-08 DIAGNOSIS — C3492 Malignant neoplasm of unspecified part of left bronchus or lung: Secondary | ICD-10-CM | POA: Diagnosis not present

## 2023-08-08 DIAGNOSIS — J9811 Atelectasis: Secondary | ICD-10-CM | POA: Diagnosis not present

## 2023-08-09 ENCOUNTER — Ambulatory Visit: Admitting: Internal Medicine

## 2023-08-09 ENCOUNTER — Encounter: Payer: Self-pay | Admitting: Internal Medicine

## 2023-08-09 VITALS — BP 109/63 | HR 121 | Ht 62.0 in | Wt 193.0 lb

## 2023-08-09 DIAGNOSIS — Z9981 Dependence on supplemental oxygen: Secondary | ICD-10-CM | POA: Diagnosis not present

## 2023-08-09 DIAGNOSIS — J9611 Chronic respiratory failure with hypoxia: Secondary | ICD-10-CM

## 2023-08-09 DIAGNOSIS — R0609 Other forms of dyspnea: Secondary | ICD-10-CM

## 2023-08-09 DIAGNOSIS — J9612 Chronic respiratory failure with hypercapnia: Secondary | ICD-10-CM

## 2023-08-09 DIAGNOSIS — Z87891 Personal history of nicotine dependence: Secondary | ICD-10-CM

## 2023-08-09 NOTE — Patient Instructions (Addendum)
 Make sure you check your oxygen  saturation  AT  your highest level of activity (not after you stop)   to be sure it stays over 90% and adjust  02 flow upward to maintain this level if needed but remember to turn it back to previous settings when you stop (to conserve your supply).   Call me with any discrepancies on your medication list   Only use your albuterol  as a rescue medication to be used if you can't catch your breath by resting or doing a relaxed purse lip breathing pattern.  - The less you use it, the better it will work when you need it. - Ok to use up to 2 puffs  every 4 hours if you must but call for immediate appointment if use goes up over your usual need - Don't leave home without it !!  (think of it like the spare tire for your car)   Use nebulizer up to every 4 hours as needed if you first try the inhaler and it doesn't work  Also  Ok to try albuterol  15 min before an activity (on alternating days to see if it makes any diffence between the inhaler and nebulizer)  that you know would usually make you short of breath and see if it makes any difference and if makes none then don't take albuterol  after activity unless you can't catch your breath as this means it's the resting that helps, not the albuterol .      Please schedule a follow up office visit in 6 weeks, call sooner if needed with all medications /inhalers/ solutions in hand so we can verify exactly what you are taking. This includes all medications from all doctors and over the counters

## 2023-08-10 ENCOUNTER — Other Ambulatory Visit: Payer: Self-pay

## 2023-08-10 DIAGNOSIS — Z7901 Long term (current) use of anticoagulants: Secondary | ICD-10-CM | POA: Diagnosis not present

## 2023-08-10 DIAGNOSIS — Z9181 History of falling: Secondary | ICD-10-CM | POA: Diagnosis not present

## 2023-08-10 DIAGNOSIS — C3492 Malignant neoplasm of unspecified part of left bronchus or lung: Secondary | ICD-10-CM | POA: Diagnosis not present

## 2023-08-10 DIAGNOSIS — Z9981 Dependence on supplemental oxygen: Secondary | ICD-10-CM | POA: Diagnosis not present

## 2023-08-10 DIAGNOSIS — Z96612 Presence of left artificial shoulder joint: Secondary | ICD-10-CM | POA: Diagnosis not present

## 2023-08-10 DIAGNOSIS — D630 Anemia in neoplastic disease: Secondary | ICD-10-CM | POA: Diagnosis not present

## 2023-08-10 DIAGNOSIS — I119 Hypertensive heart disease without heart failure: Secondary | ICD-10-CM | POA: Diagnosis not present

## 2023-08-10 DIAGNOSIS — J9621 Acute and chronic respiratory failure with hypoxia: Secondary | ICD-10-CM | POA: Diagnosis not present

## 2023-08-10 DIAGNOSIS — I2510 Atherosclerotic heart disease of native coronary artery without angina pectoris: Secondary | ICD-10-CM | POA: Diagnosis not present

## 2023-08-10 DIAGNOSIS — Z86711 Personal history of pulmonary embolism: Secondary | ICD-10-CM | POA: Diagnosis not present

## 2023-08-10 DIAGNOSIS — I700 Atherosclerosis of aorta: Secondary | ICD-10-CM | POA: Diagnosis not present

## 2023-08-10 DIAGNOSIS — C7931 Secondary malignant neoplasm of brain: Secondary | ICD-10-CM | POA: Diagnosis not present

## 2023-08-10 DIAGNOSIS — K5730 Diverticulosis of large intestine without perforation or abscess without bleeding: Secondary | ICD-10-CM | POA: Diagnosis not present

## 2023-08-10 DIAGNOSIS — C229 Malignant neoplasm of liver, not specified as primary or secondary: Secondary | ICD-10-CM | POA: Diagnosis not present

## 2023-08-10 DIAGNOSIS — D509 Iron deficiency anemia, unspecified: Secondary | ICD-10-CM | POA: Diagnosis not present

## 2023-08-10 DIAGNOSIS — E119 Type 2 diabetes mellitus without complications: Secondary | ICD-10-CM | POA: Diagnosis not present

## 2023-08-10 DIAGNOSIS — K219 Gastro-esophageal reflux disease without esophagitis: Secondary | ICD-10-CM | POA: Diagnosis not present

## 2023-08-10 DIAGNOSIS — I350 Nonrheumatic aortic (valve) stenosis: Secondary | ICD-10-CM | POA: Diagnosis not present

## 2023-08-10 DIAGNOSIS — Z96641 Presence of right artificial hip joint: Secondary | ICD-10-CM | POA: Diagnosis not present

## 2023-08-10 DIAGNOSIS — I3139 Other pericardial effusion (noninflammatory): Secondary | ICD-10-CM | POA: Diagnosis not present

## 2023-08-10 DIAGNOSIS — I4891 Unspecified atrial fibrillation: Secondary | ICD-10-CM | POA: Diagnosis not present

## 2023-08-10 DIAGNOSIS — J9811 Atelectasis: Secondary | ICD-10-CM | POA: Diagnosis not present

## 2023-08-10 NOTE — Assessment & Plan Note (Addendum)
 HC03   07/23/23  =   34   Hypercarbia likely from obesity/ bilateral effusions.  Rec Titrate 02 flow to  to keep sats low 90s by pulse ox at home > call if 02 supply not adequate.  Consider shift palliative care/ hospice rather than escalating pulmonary interventions but defer this call to Dr Linnell Richardson.   F/u  6 weeks with all meds in hand using a trust but verify approach to confirm accurate Medication  Reconciliation The principal here is that until we are certain that the  patients are doing what we've asked, it makes no sense to ask them to do more.   Each maintenance medication was reviewed in detail including emphasizing most importantly the difference between maintenance and prns and under what circumstances the prns are to be triggered using an action plan format where appropriate.  Total time for H and P, chart review, counseling, reviewing hfa/ neb/02 /pulse ox  device(s) , directly observing portions of ambulatory 02 saturation study/ and generating customized AVS unique to this office visit / same day charting = 44 min complex pt new to me.

## 2023-08-10 NOTE — Assessment & Plan Note (Addendum)
 Onset around Jan 1st 2021 / slowly progressive Echo 05/15/19 Left ventricular ejection fraction, by estimation, is 60 to 65%. The  left ventricle has normal function. The left ventricle has no regional  wall motion abnormalities. There is moderate left ventricular hypertrophy.  Left ventricular diastolic  parameters are consistent with Grade I diastolic dysfunction (impaired  relaxation). Elevated left atrial pressure.   2. Right ventricular systolic function is normal. The right ventricular  size is normal. There is normal pulmonary artery systolic pressure.   3. Left atrial size was moderately dilated.   4. The mitral valve is normal in structure and function. No evidence of  mitral valve regurgitation. No evidence of mitral stenosis.   5. The aortic valve is tricuspid. Aortic valve regurgitation is mild.  Moderate aortic valve stenosis.Aortic valve mean gradient measures 21.0  mmHg. Aortic valve peak gradient measures 36.2 mmHg. Aortic valve area, by  VTI measures 1.27 cm   6. The inferior vena cava is normal in size with greater than 50%  respiratory variability, suggesting right atrial pressure of 3 mmHg.   7. Cannot exclude small PFO with left to right shunt.  -  10/09/2019   Walked RA  approx   200 ft  @ moderate pace  stopped due to  Sob with sats 92%     Multifactorial but mostly based on restrictive physiology due to obesity  and bilateral loculated pleural effusions  which radiographically and clinically are unchanged since last admit and nothing to suggest for now other than making sure her 02 sats stay above 90% given that she has at least mod AS with limited cardiac reserve.  In terms of using saba: Re SABA :  I spent extra time with pt today reviewing appropriate use of albuterol  for prn use on exertion with the following points: 1) saba is for relief of sob that does not improve by walking a slower pace or resting but rather if the pt does not improve after trying this  first. 2) If the pt is convinced, as many are, that saba helps recover from activity faster then it's easy to tell if this is the case by re-challenging : ie stop, take the inhaler, then p 5 minutes try the exact same activity (intensity of workload) that just caused the symptoms and see if they are substantially diminished or not after saba 3) if there is an activity that reproducibly causes the symptoms, try the saba 15 min before the activity on alternate days   If in fact the saba really does help, then fine to continue to use it prn but advised may need to look closer at the maintenance regimen (for now no maint rx)  being used to achieve better control of airways disease with exertion.

## 2023-08-13 DIAGNOSIS — I119 Hypertensive heart disease without heart failure: Secondary | ICD-10-CM | POA: Diagnosis not present

## 2023-08-13 DIAGNOSIS — J9621 Acute and chronic respiratory failure with hypoxia: Secondary | ICD-10-CM | POA: Diagnosis not present

## 2023-08-13 DIAGNOSIS — D509 Iron deficiency anemia, unspecified: Secondary | ICD-10-CM | POA: Diagnosis not present

## 2023-08-13 DIAGNOSIS — Z7901 Long term (current) use of anticoagulants: Secondary | ICD-10-CM | POA: Diagnosis not present

## 2023-08-13 DIAGNOSIS — I2510 Atherosclerotic heart disease of native coronary artery without angina pectoris: Secondary | ICD-10-CM | POA: Diagnosis not present

## 2023-08-13 DIAGNOSIS — I350 Nonrheumatic aortic (valve) stenosis: Secondary | ICD-10-CM | POA: Diagnosis not present

## 2023-08-13 DIAGNOSIS — C229 Malignant neoplasm of liver, not specified as primary or secondary: Secondary | ICD-10-CM | POA: Diagnosis not present

## 2023-08-13 DIAGNOSIS — Z96641 Presence of right artificial hip joint: Secondary | ICD-10-CM | POA: Diagnosis not present

## 2023-08-13 DIAGNOSIS — Z96612 Presence of left artificial shoulder joint: Secondary | ICD-10-CM | POA: Diagnosis not present

## 2023-08-13 DIAGNOSIS — J9811 Atelectasis: Secondary | ICD-10-CM | POA: Diagnosis not present

## 2023-08-13 DIAGNOSIS — K219 Gastro-esophageal reflux disease without esophagitis: Secondary | ICD-10-CM | POA: Diagnosis not present

## 2023-08-13 DIAGNOSIS — Z9181 History of falling: Secondary | ICD-10-CM | POA: Diagnosis not present

## 2023-08-13 DIAGNOSIS — I4891 Unspecified atrial fibrillation: Secondary | ICD-10-CM | POA: Diagnosis not present

## 2023-08-13 DIAGNOSIS — I3139 Other pericardial effusion (noninflammatory): Secondary | ICD-10-CM | POA: Diagnosis not present

## 2023-08-13 DIAGNOSIS — C3492 Malignant neoplasm of unspecified part of left bronchus or lung: Secondary | ICD-10-CM | POA: Diagnosis not present

## 2023-08-13 DIAGNOSIS — D630 Anemia in neoplastic disease: Secondary | ICD-10-CM | POA: Diagnosis not present

## 2023-08-13 DIAGNOSIS — K5730 Diverticulosis of large intestine without perforation or abscess without bleeding: Secondary | ICD-10-CM | POA: Diagnosis not present

## 2023-08-13 DIAGNOSIS — I700 Atherosclerosis of aorta: Secondary | ICD-10-CM | POA: Diagnosis not present

## 2023-08-13 DIAGNOSIS — Z9981 Dependence on supplemental oxygen: Secondary | ICD-10-CM | POA: Diagnosis not present

## 2023-08-13 DIAGNOSIS — C7931 Secondary malignant neoplasm of brain: Secondary | ICD-10-CM | POA: Diagnosis not present

## 2023-08-13 DIAGNOSIS — E119 Type 2 diabetes mellitus without complications: Secondary | ICD-10-CM | POA: Diagnosis not present

## 2023-08-13 DIAGNOSIS — Z86711 Personal history of pulmonary embolism: Secondary | ICD-10-CM | POA: Diagnosis not present

## 2023-08-14 ENCOUNTER — Inpatient Hospital Stay: Admitting: Hematology

## 2023-08-14 ENCOUNTER — Inpatient Hospital Stay

## 2023-08-14 VITALS — BP 140/70 | HR 84 | Temp 98.2°F | Resp 20

## 2023-08-14 VITALS — Wt 189.8 lb

## 2023-08-14 DIAGNOSIS — D329 Benign neoplasm of meninges, unspecified: Secondary | ICD-10-CM | POA: Diagnosis not present

## 2023-08-14 DIAGNOSIS — C787 Secondary malignant neoplasm of liver and intrahepatic bile duct: Secondary | ICD-10-CM | POA: Diagnosis not present

## 2023-08-14 DIAGNOSIS — C3492 Malignant neoplasm of unspecified part of left bronchus or lung: Secondary | ICD-10-CM

## 2023-08-14 DIAGNOSIS — J90 Pleural effusion, not elsewhere classified: Secondary | ICD-10-CM | POA: Diagnosis not present

## 2023-08-14 DIAGNOSIS — E119 Type 2 diabetes mellitus without complications: Secondary | ICD-10-CM | POA: Diagnosis not present

## 2023-08-14 DIAGNOSIS — D649 Anemia, unspecified: Secondary | ICD-10-CM | POA: Diagnosis not present

## 2023-08-14 DIAGNOSIS — Z7901 Long term (current) use of anticoagulants: Secondary | ICD-10-CM | POA: Diagnosis not present

## 2023-08-14 DIAGNOSIS — C3412 Malignant neoplasm of upper lobe, left bronchus or lung: Secondary | ICD-10-CM | POA: Diagnosis not present

## 2023-08-14 DIAGNOSIS — I2699 Other pulmonary embolism without acute cor pulmonale: Secondary | ICD-10-CM | POA: Diagnosis not present

## 2023-08-14 DIAGNOSIS — Z452 Encounter for adjustment and management of vascular access device: Secondary | ICD-10-CM | POA: Diagnosis not present

## 2023-08-14 LAB — COMPREHENSIVE METABOLIC PANEL WITH GFR
ALT: 8 U/L (ref 0–44)
AST: 14 U/L — ABNORMAL LOW (ref 15–41)
Albumin: 2.6 g/dL — ABNORMAL LOW (ref 3.5–5.0)
Alkaline Phosphatase: 91 U/L (ref 38–126)
Anion gap: 10 (ref 5–15)
BUN: 5 mg/dL — ABNORMAL LOW (ref 8–23)
CO2: 37 mmol/L — ABNORMAL HIGH (ref 22–32)
Calcium: 9 mg/dL (ref 8.9–10.3)
Chloride: 96 mmol/L — ABNORMAL LOW (ref 98–111)
Creatinine, Ser: 0.63 mg/dL (ref 0.44–1.00)
GFR, Estimated: >60 mL/min (ref >60)
Glucose, Bld: 95 mg/dL (ref 70–99)
Potassium: 3.7 mmol/L (ref 3.5–5.1)
Sodium: 143 mmol/L (ref 135–145)
Total Bilirubin: 0.4 mg/dL (ref 0.0–1.2)
Total Protein: 6.5 g/dL (ref 6.5–8.1)

## 2023-08-14 LAB — CBC WITH DIFFERENTIAL/PLATELET
Abs Immature Granulocytes: 0.01 10*3/uL (ref 0.00–0.07)
Basophils Absolute: 0 10*3/uL (ref 0.0–0.1)
Basophils Relative: 1 %
Eosinophils Absolute: 0.1 10*3/uL (ref 0.0–0.5)
Eosinophils Relative: 1 %
HCT: 34.9 % — ABNORMAL LOW (ref 36.0–46.0)
Hemoglobin: 10 g/dL — ABNORMAL LOW (ref 12.0–15.0)
Immature Granulocytes: 0 %
Lymphocytes Relative: 14 %
Lymphs Abs: 0.8 10*3/uL (ref 0.7–4.0)
MCH: 28 pg (ref 26.0–34.0)
MCHC: 28.7 g/dL — ABNORMAL LOW (ref 30.0–36.0)
MCV: 97.8 fL (ref 80.0–100.0)
Monocytes Absolute: 0.4 10*3/uL (ref 0.1–1.0)
Monocytes Relative: 7 %
Neutro Abs: 4.4 10*3/uL (ref 1.7–7.7)
Neutrophils Relative %: 77 %
Platelets: 335 10*3/uL (ref 150–400)
RBC: 3.57 MIL/uL — ABNORMAL LOW (ref 3.87–5.11)
RDW: 15.1 % (ref 11.5–15.5)
WBC: 5.8 10*3/uL (ref 4.0–10.5)
nRBC: 0 % (ref 0.0–0.2)

## 2023-08-14 LAB — HEMOGLOBIN A1C
Hgb A1c MFr Bld: 5.3 % (ref 4.8–5.6)
Mean Plasma Glucose: 105.41 mg/dL

## 2023-08-14 LAB — MAGNESIUM: Magnesium: 1.8 mg/dL (ref 1.7–2.4)

## 2023-08-14 MED ORDER — SODIUM CHLORIDE 0.9% FLUSH
10.0000 mL | INTRAVENOUS | Status: DC | PRN
Start: 2023-08-14 — End: 2023-08-14
  Administered 2023-08-14: 10 mL via INTRAVENOUS

## 2023-08-14 MED ORDER — HEPARIN SOD (PORK) LOCK FLUSH 100 UNIT/ML IV SOLN
500.0000 [IU] | Freq: Once | INTRAVENOUS | Status: AC
  Administered 2023-08-14: 500 [IU] via INTRAVENOUS

## 2023-08-14 MED ORDER — SODIUM CHLORIDE 0.9% FLUSH
10.0000 mL | INTRAVENOUS | Status: AC
  Administered 2023-08-14: 10 mL via INTRAVENOUS

## 2023-08-14 NOTE — Progress Notes (Signed)
 [facility] [address] [place] [address]    Clinic Day:  08/14/2023  Referring physician: Wilburn Handler, MD  Patient Care Team: Wilburn Handler, MD as PCP - General (Family Medicine) Gerard Knight, MD as PCP - Cardiology (Cardiology) Paulett Boros, MD as Medical Oncologist (Medical Oncology) Diamond Formica, MD as Consulting Physician (Pulmonary Disease)   ASSESSMENT & PLAN:   Assessment: 1.  Adenocarcinoma of left lung (HCC) -Chemoradiation therapy with carboplatin  and paclitaxel  from 01/07/2019 through 02/11/2019. -Consolidation immunotherapy with durvalumab  from 03/19/2019 through 04/15/2018, held due to pneumonitis. -CT chest on 07/02/2019 shows left upper lobe lung mass measuring 2.6 x 2.0 cm.  It shows improvement in size. -MRI of the brain on 07/18/2019 showed left sphenoid wing meningioma measuring 2.6 x 2.0 x 2.6 cm unchanged.  No new enhancing intracranial lesion.  Increased left temporal white matter edema. -Durvalumab  restarted on 07/24/2019. -She was evaluated by cardiology with a stress test.  EF was 46%.  She underwent cardiac catheterization which did not show any abnormalities. - 1 year of durvalumab  completed on 07/23/2020. - Liver mass biopsy (08/08/2021): Adenocarcinoma, CK7 positive, negative for TTF-1, Napsin a, GATA3, ER, CK20, CDX2 - PET scan (08/25/2021): Right upper lobe lung nodule 9 mm, SUV 5.0.  Groundglass and solid nodule periphery of the right upper lobe 8 mm, SUV 2.45.  Right lobe liver mass 4.5 x 3.6 cm, SUV 7.78. - NGS testing: PD-L1 negative, TMB-low, MSI-stable, K-ras G12 D.  No other targetable mutations. - Carboplatin , pemetrexed  and pembrolizumab  4 cycles from 09/22/2021 through 11/25/2021, followed by maintenance pemetrexed  and pembrolizumab .  Last pemetrexed  and pembrolizumab  on 11/30/2022.  Pembrolizumab  discontinued due to colitis. -  CT CAP (11/10/2021): Reduced size of liver mass and interval resolution of previously seen  groundglass pulmonary nodules.  There is a new inflammatory right upper lobe nodule.    Plan: 1.  Metastatic adenocarcinoma of the lung to the liver and right lung: - She had hospitalizations in March and April from respiratory symptoms. - Last CT CAP in March: Stable disease. - CT chest (06/24/2023): Malignancy stable with stable liver lesions. - She was recently evaluated by Dr. Waymond Hailey.  She is on oxygen  at 3 L/min and increases to 4 L/min on exertion. - Reviewed labs today: Normal LFTs.  CBC grossly normal. - Chest x-ray on 07/23/2023: Stable when compared to x-ray from April. - Due to poor tolerance, I have recommended that we hold off on chemotherapy at this time.  We will reevaluate her in 4 weeks with repeat CT CAP.  If there is any progression, we will consider restarting on pemetrexed .   2.  Meningioma: - MRI brain on 10/02/2022 showed stable left sphenoid wing meningioma.  If she has any symptoms, consider repeat imaging.  3.  Unprovoked pulmonary embolism: - Continue Xarelto  20 mg daily indefinitely.  No bleeding issues reported.   4.  Leg swellings: - Lasix  is on hold since discharge from hospital.  Continue wrapping lower extremities.  Albumin improved to 2.6 today.   5.  Normocytic anemia: - Anemia from myelosuppression.  Hemoglobin has improved to 10 as she did not get any chemo in the last 2 and half months.   6.  Hypomagnesemia: - She will continue magnesium  twice daily.  Magnesium  is normal.    Orders Placed This Encounter  Procedures   CT CHEST ABDOMEN PELVIS W CONTRAST    Standing Status:   Future    Expected Date:  09/14/2023    Expiration Date:   08/13/2024    If indicated for the ordered procedure, I authorize the administration of contrast media per Radiology protocol:   Yes    Does the patient have a contrast media/X-ray dye allergy?:   No    Preferred imaging location?:   [facility]    Release to patient:   Immediate    If indicated for the ordered  procedure, I authorize the administration of oral contrast media per Radiology protocol:   Yes      I,Helena R Teague,acting as a scribe for Paulett Boros, MD.,have documented all relevant documentation on the behalf of Paulett Boros, MD,as directed by  Paulett Boros, MD while in the presence of Paulett Boros, MD.  I, Paulett Boros MD, have reviewed the above documentation for accuracy and completeness, and I agree with the above.     Paulett Boros, MD   5/27/20252:33 PM  CHIEF COMPLAINT:   Diagnosis: stage IV adenocarcinoma of the left lung    Cancer Staging  Adenocarcinoma of left lung [facility]) Staging form: Lung, AJCC 8th Edition - Clinical stage from 01/02/2019: Stage IIIB (cT3, cN2, cM0) - Signed by Paulett Boros, MD on 01/02/2019 - Pathologic stage from 09/07/2021: Stage IVA (pTX, pNX, pM1b) - Unsigned    Prior Therapy: 1. Chemoradiation with carboplatin  and paclitaxel  from 01/07/2019 to 02/11/2019. 2. Consolidation with durvalumab  from 03/19/2019 to 04/16/2019, held due to pneumonitis.  Current Therapy:  Pemetrexed  and pembrolizumab  maintenance    HISTORY OF PRESENT ILLNESS:   Oncology History  Adenocarcinoma of left lung (HCC)  12/09/2018 Initial Diagnosis   Adenocarcinoma of left lung (HCC)   01/02/2019 Cancer Staging   Staging form: Lung, AJCC 8th Edition - Clinical stage from 01/02/2019: Stage IIIB (cT3, cN2, cM0) - Signed by Paulett Boros, MD on 01/02/2019   01/07/2019 - 02/11/2019 Chemotherapy   The patient had palonosetron  (ALOXI ) injection 0.25 mg, 0.25 mg, Intravenous,  Once, 6 of 6 cycles Administration: 0.25 mg (01/07/2019), 0.25 mg (01/14/2019), 0.25 mg (01/21/2019), 0.25 mg (01/28/2019), 0.25 mg (02/04/2019), 0.25 mg (02/11/2019) CARBOplatin  (PARAPLATIN ) 270 mg in sodium chloride  0.9 % 250 mL chemo infusion, 270 mg (100 % of original dose 266.4 mg), Intravenous,  Once, 6 of 6 cycles Dose modification:    (original dose 266.4 mg, Cycle 1),   (original dose 266.4 mg, Cycle 2),   (original dose 266.4 mg, Cycle 3),   (original dose 266.4 mg, Cycle 4) Administration: 270 mg (01/07/2019), 270 mg (01/14/2019), 270 mg (01/21/2019), 270 mg (01/28/2019), 270 mg (02/04/2019), 270 mg (02/11/2019) PACLitaxel  (TAXOL ) 108 mg in sodium chloride  0.9 % 250 mL chemo infusion (</= 80mg /m2), 45 mg/m2 = 108 mg, Intravenous,  Once, 6 of 6 cycles Administration: 108 mg (01/07/2019), 108 mg (01/14/2019), 108 mg (01/21/2019), 108 mg (01/28/2019), 108 mg (02/04/2019), 108 mg (02/11/2019) fosaprepitant  (EMEND) 150 mg, dexamethasone  (DECADRON ) 12 mg in sodium chloride  0.9 % 145 mL IVPB, , Intravenous,  Once, 5 of 5 cycles Administration:  (01/14/2019),  (01/21/2019),  (01/28/2019),  (02/04/2019),  (02/11/2019)  for chemotherapy treatment.    03/19/2019 - 07/23/2020 Chemotherapy   Patient is on Treatment Plan : LUNG DURVALUMAB  Q14D     09/22/2021 - 11/04/2021 Chemotherapy   Patient is on Treatment Plan : LUNG Carboplatin  (5) + Pemetrexed  (500) + Pembrolizumab  (200) D1 q21d Induction x 4 cycles / Maintenance Pemetrexed  (500) + Pembrolizumab  (200) D1 q21d     09/22/2021 -  Chemotherapy   Patient is on Treatment Plan :  LUNG Maintenance Alimta q 21 days        INTERVAL HISTORY:   Denise [name] is a 73 y.o. female presenting to clinic today for follow up of stage IV adenocarcinoma of the left lung. She was last seen by me on 07/23/23.  Today, she states that she is doing well overall. Her appetite level is at 25%. Her energy level is at 25%. Her breathing is stable, though she has seen Dr. Larae Plaster recently and is now on 3 L of at-home oxygen  when sitting. She notes SOB worsened on exertion and requires 4 L of oxygen  when walking.  She is taking Xarelto  once daily. Denise [name] reports the last time she was hospitalized, she was started on Cardizem  CD. She is taking folic acid  as prescribed.   PAST MEDICAL HISTORY:   Past Medical History: Past Medical  History:  Diagnosis Date   Anemia    Aortic stenosis    Arthritis    Cancer (HCC)    lung cancer 2019   Coronary artery calcification seen on CT scan    Dyspnea    Essential hypertension    GERD (gastroesophageal reflux disease)    History of lung cancer    Stage III adenocarcinoma status post chemoradiation   Port-A-Cath in place 01/06/2019   Type 2 diabetes mellitus [facility])     Surgical History: Past Surgical History:  Procedure Laterality Date   BIOPSY  09/25/2022   Procedure: BIOPSY;  Surgeon: Vinetta Greening, DO;  [facility];  Service: Endoscopy;;   CHOLECYSTECTOMY  1997   COLONOSCOPY     COLONOSCOPY WITH PROPOFOL  N/A 09/25/2022   Procedure: COLONOSCOPY WITH PROPOFOL ;  Surgeon: Vinetta Greening, DO;  [facility]: [facility];  Service: Endoscopy;  Laterality: N/A;  11:00;ASA 3   ESOPHAGEAL BRUSHING  09/25/2022   Procedure: ESOPHAGEAL BRUSHING;  Surgeon: Vinetta Greening, DO;  [facility];  Service: Endoscopy;;   ESOPHAGOGASTRODUODENOSCOPY (EGD) WITH PROPOFOL  N/A 09/25/2022   Procedure: ESOPHAGOGASTRODUODENOSCOPY (EGD) WITH PROPOFOL ;  Surgeon: Vinetta Greening, DO;  [facility];  Service: Endoscopy;  Laterality: N/A;  11:00AM;ASA 3   GASTRIC BYPASS     INCISIONAL HERNIA REPAIR  04/11/11   IR IMAGING GUIDED PORT INSERTION  12/27/2018   Right   IR US  GUIDE BX ASP/DRAIN  08/08/2021   LAPAROSCOPIC SALPINGOOPHERECTOMY     LAPAROTOMY  04/11/2011   Procedure: EXPLORATORY LAPAROTOMY;  Surgeon: Brandy Cal. Cornett, MD;  Location: [facility];  Service: General;  Laterality: N/A;  closure port hole   POLYPECTOMY  09/25/2022   Procedure: POLYPECTOMY;  Surgeon: Vinetta Greening, DO;  [facility];  Service: Endoscopy;;   RIGHT/LEFT HEART CATH AND CORONARY ANGIOGRAPHY N/A 12/29/2019   Procedure: RIGHT/LEFT HEART CATH AND CORONARY ANGIOGRAPHY;  Surgeon: Millicent Ally, MD;  Location: [facility];  Service: Cardiovascular;  Laterality: N/A;   TOTAL  HIP ARTHROPLASTY  03/07/2012   Procedure: TOTAL HIP ARTHROPLASTY ANTERIOR APPROACH;  Surgeon: Bevin Bucks, MD;  Location: [facility];  Service: Orthopedics;  Laterality: Right;   TOTAL SHOULDER ARTHROPLASTY Left 01/21/2015   TOTAL SHOULDER ARTHROPLASTY Left 01/21/2015   Procedure: LEFT TOTAL SHOULDER ARTHROPLASTY;  Surgeon: Ellard Gunning, MD;  Location: [facility];  Service: Orthopedics;  Laterality: Left;   VAGINAL HYSTERECTOMY      Social History: Social History   Socioeconomic History   Marital status: Single    Spouse name: Not on file   Number of children: Not on  file   Years of education: 12th grade   Highest education level: Not on file  Occupational History   Occupation: Employed    Employer: [employer]  Tobacco Use   Smoking status: Former    Current packs/day: 0.00    Average packs/day: 1 pack/day for 12.0 years (12.0 ttl pk-yrs)    Types: Cigarettes    Start date: 03/20/1964    Quit date: 03/20/1976    Years since quitting: 47.4    Passive exposure: Past   Smokeless tobacco: Never  Vaping Use   Vaping status: Never Used  Substance and Sexual Activity   Alcohol  use: No   Drug use: No   Sexual activity: Not Currently  Other Topics Concern   Not on file  Social History Narrative   Not on file   Social Drivers of Health   Financial Resource Strain: Low Risk  (12/06/2018)   Overall Financial Resource Strain (CARDIA)    Difficulty of Paying Living Expenses: Not very hard  Food Insecurity: No Food Insecurity (06/24/2023)   Hunger Vital Sign    Worried About Running Out of Food in the Last Year: Never true    Ran Out of Food in the Last Year: Never true  Transportation Needs: No Transportation Needs (06/24/2023)   PRAPARE - Administrator, Civil Service (Medical): No    Lack of Transportation (Non-Medical): No  Physical Activity: Inactive (12/06/2018)   Exercise Vital Sign    Days of Exercise per Week: 0 days    Minutes of Exercise per Session: 0 min   Stress: No Stress Concern Present (12/09/2018)   Received from [facility], [facility]   [facility] - Occupational Stress Questionnaire    Feeling of Stress : Not at all  Social Connections: Unknown (06/24/2023)   Social Connection and Isolation Panel [NHANES]    Frequency of Communication with Friends and Family: More than three times a week    Frequency of Social Gatherings with Friends and Family: Once a week    Attends Religious Services: More than 4 times per year    Active Member of [employer] or Organizations: No    Attends Banker Meetings: Never    Marital Status: Patient declined  Recent Concern: Social Connections - Moderately Isolated (06/13/2023)   Social Connection and Isolation Panel [NHANES]    Frequency of Communication with Friends and Family: More than three times a week    Frequency of Social Gatherings with Friends and Family: Once a week    Attends Religious Services: More than 4 times per year    Active Member of [employer] or Organizations: No    Attends Banker Meetings: Never    Marital Status: Never married  Intimate Partner Violence: Not At Risk (06/24/2023)   Humiliation, Afraid, Rape, and Kick questionnaire    Fear of Current or Ex-Partner: No    Emotionally Abused: No    Physically Abused: No    Sexually Abused: No    Family History: Family History  Problem Relation Age of Onset   Breast cancer Mother    COPD Mother    Arthritis Mother    Diabetes Mother    Hypertension Mother    Hypertension Father    Diabetes Father    Breast cancer Sister    Thyroid  cancer Brother    Heart attack Brother    Huntington's disease Maternal Grandmother     Current Medications:  Current  Outpatient Medications:    acetaminophen  (TYLENOL ) 500 MG tablet, Take 500 mg by mouth every 6 (six) hours as needed for moderate pain., Disp: , Rfl:    albuterol  (PROVENTIL ) (2.5 MG/3ML) 0.083% nebulizer solution, Take 3  mLs (2.5 mg total) by nebulization every 6 (six) hours as needed for wheezing or shortness of breath., Disp: 75 mL, Rfl: 12   albuterol  (VENTOLIN  HFA) 108 (90 Base) MCG/ACT inhaler, Inhale 2 puffs into the lungs every 6 (six) hours as needed for wheezing or shortness of breath., Disp: 6.7 g, Rfl: 0   ALPRAZolam  (XANAX ) 0.25 MG tablet, Take 0.125 mg by mouth daily., Disp: , Rfl:    Calcium  Carbonate Antacid (TUMS PO), Take 2-3 tablets by mouth daily., Disp: , Rfl:    Cyanocobalamin  (VITAMIN B-12) 2500 MCG SUBL, Place 2,500 mcg under the tongue every morning. , Disp: , Rfl:    diltiazem  (CARDIZEM  CD) 120 MG 24 hr capsule, Take 120 mg by mouth daily., Disp: , Rfl:    ferrous sulfate  325 (65 FE) MG tablet, Take 325 mg by mouth every evening., Disp: , Rfl:    folic acid  (FOLVITE ) 1 MG tablet, TAKE 1 TABLET BY MOUTH EVERY DAY, Disp: 90 tablet, Rfl: 1   furosemide  (LASIX ) 20 MG tablet, Take 40 mg by mouth daily., Disp: , Rfl:    gabapentin  (NEURONTIN ) 300 MG capsule, Take 300 mg by mouth at bedtime., Disp: , Rfl:    Glycerin-Hypromellose-PEG 400 (DRY EYE RELIEF DROPS OP), Apply 1 drop to eye daily as needed (dry eyes)., Disp: , Rfl:    lidocaine -prilocaine  (EMLA ) cream, Apply a small amount to port a cath site and cover with plastic wrap 1 hour prior to chemotherapy appointments, Disp: 30 g, Rfl: 3   loteprednol  (LOTEMAX ) 0.5 % ophthalmic suspension, Place 1 drop into both eyes 2 (two) times daily., Disp: , Rfl:    magnesium  oxide (MAG-OX) 400 MG tablet, Take 1 tablet by mouth 2 (two) times daily., Disp: , Rfl:    midodrine  (PROAMATINE ) 5 MG tablet, Take 5 mg by mouth 3 (three) times daily., Disp: , Rfl:    Multiple Vitamin (MULITIVITAMIN WITH MINERALS) TABS, Take 1 tablet by mouth daily., Disp: , Rfl:    omeprazole (PRILOSEC) 20 MG capsule, Take 20 mg by mouth daily., Disp: , Rfl:    rivaroxaban  (XARELTO ) 20 MG TABS tablet, Take 1 tablet (20 mg total) by mouth daily with supper., Disp: 30 tablet, Rfl:  3 No current facility-administered medications for this visit.  Facility-Administered Medications Ordered in Other Visits:    magnesium  sulfate 2 GM/50ML IVPB, , , ,    sodium chloride  flush (NS) 0.9 % injection 10 mL, 10 mL, Intravenous, PRN, Manasvini Whatley, MD, 10 mL at 08/14/23 1430   Allergies: No Known Allergies  REVIEW OF SYSTEMS:   Review of Systems  Constitutional:  Negative for chills, fatigue and fever.  HENT:   Negative for lump/mass, mouth sores, nosebleeds, sore throat and trouble swallowing.   Eyes:  Negative for eye problems.  Respiratory:  Negative for cough and shortness of breath.   Cardiovascular:  Negative for chest pain, leg swelling and palpitations.  Gastrointestinal:  Negative for abdominal pain, constipation, diarrhea, nausea and vomiting.  Genitourinary:  Negative for bladder incontinence, difficulty urinating, dysuria, frequency, hematuria and nocturia.   Musculoskeletal:  Negative for arthralgias, back pain, flank pain, myalgias and neck pain.  Skin:  Negative for itching and rash.  Neurological:  Negative for dizziness, headaches and numbness.  Hematological:  Does not bruise/bleed easily.  Psychiatric/Behavioral:  Negative for depression, sleep disturbance and suicidal ideas. The patient is not nervous/anxious.   All other systems reviewed and are negative.    VITALS:   Weight 189 lb 13.1 oz (86.1 kg).  Wt Readings from Last 3 Encounters:  08/14/23 189 lb 13.1 oz (86.1 kg)  08/09/23 193 lb (87.5 kg)  07/23/23 193 lb (87.5 kg)    Body mass index is 34.72 kg/m.  Performance status (ECOG): 1 - Symptomatic but completely ambulatory  PHYSICAL EXAM:   Physical Exam Vitals and nursing note reviewed. Exam conducted with a chaperone present.  Constitutional:      Appearance: Normal appearance.  Cardiovascular:     Rate and Rhythm: Normal rate and regular rhythm.     Pulses: Normal pulses.     Heart sounds: Normal heart sounds.  Pulmonary:      Effort: Pulmonary effort is normal.     Breath sounds: Normal breath sounds.  Abdominal:     Palpations: Abdomen is soft. There is no hepatomegaly, splenomegaly or mass.     Tenderness: There is no abdominal tenderness.  Musculoskeletal:     Right lower leg: No edema.     Left lower leg: No edema.  Lymphadenopathy:     Cervical: No cervical adenopathy.     Right cervical: No superficial, deep or posterior cervical adenopathy.    Left cervical: No superficial, deep or posterior cervical adenopathy.     Upper Body:     Right upper body: No supraclavicular or axillary adenopathy.     Left upper body: No supraclavicular or axillary adenopathy.  Neurological:     General: No focal deficit present.     Mental Status: She is alert and oriented to person, place, and time.  Psychiatric:        Mood and Affect: Mood normal.        Behavior: Behavior normal.     LABS:   CBC     Component Value Date/Time   WBC 5.8 08/14/2023 1304   RBC 3.57 (L) 08/14/2023 1304   HGB 10.0 (L) 08/14/2023 1304   HCT 34.9 (L) 08/14/2023 1304   PLT 335 08/14/2023 1304   MCV 97.8 08/14/2023 1304   MCH 28.0 08/14/2023 1304   MCHC 28.7 (L) 08/14/2023 1304   RDW 15.1 08/14/2023 1304   LYMPHSABS 0.8 08/14/2023 1304   MONOABS 0.4 08/14/2023 1304   EOSABS 0.1 08/14/2023 1304   BASOSABS 0.0 08/14/2023 1304    CMP      Component Value Date/Time   NA 143 08/14/2023 1304   K 3.7 08/14/2023 1304   CL 96 (L) 08/14/2023 1304   CO2 37 (H) 08/14/2023 1304   GLUCOSE 95 08/14/2023 1304   BUN 5 (L) 08/14/2023 1304   CREATININE 0.63 08/14/2023 1304   CALCIUM  9.0 08/14/2023 1304   PROT 6.5 08/14/2023 1304   ALBUMIN 2.6 (L) 08/14/2023 1304   AST 14 (L) 08/14/2023 1304   ALT 8 08/14/2023 1304   ALKPHOS 91 08/14/2023 1304   BILITOT 0.4 08/14/2023 1304   GFRNONAA >60 08/14/2023 1304   GFRAA >60 12/11/2019 0817     No results found for: "CEA1", "CEA" / No results found for: "CEA1", "CEA" No results  found for: "PSA1" No results found for: "ZOX096" No results found for: "CAN125"  No results found for: "TOTALPROTELP", "ALBUMINELP", "A1GS", "A2GS", "BETS", "BETA2SER", "GAMS", "MSPIKE", "SPEI" Lab Results  Component Value Date   TIBC 241 (L) 12/20/2022  TIBC 215 (L) 09/18/2022   TIBC 227 (L) 06/05/2022   FERRITIN 715 (H) 12/20/2022   FERRITIN 596 (H) 09/18/2022   FERRITIN 310 (H) 06/05/2022   IRONPCTSAT 12 12/20/2022   IRONPCTSAT 15 09/18/2022   IRONPCTSAT 17 06/05/2022   Lab Results  Component Value Date   LDH 168 06/25/2023   LDH 178 12/29/2022   LDH 185 12/25/2022     STUDIES:   DG Chest 2 View Result Date: 07/24/2023 CLINICAL DATA:  73 year old female with shortness of breath EXAM: CHEST - 2 VIEW COMPARISON:  06/27/2023, 06/24/2023 Chest CT 06/24/2023 FINDINGS: Cardiomediastinal silhouette unchanged with cardiomegaly. Unchanged right IJ port catheter Loculated pleural fluid at the right lung base unchanged from the prior. Opacity at the left lung base obscuring the left hemidiaphragm unchanged. Opacity at the left lung apex with air bronchograms is unchanged. This region was better demonstrated on recent CT chest No pneumothorax. Degenerative changes of the spine IMPRESSION: No change in the appearance of the chest x-ray, with loculated pleural fluid at the right greater than left lung base, post treatment changes of the left lung apex, and cardiomegaly. Electronically Signed   By: Myrlene Asper D.O.   On: 07/24/2023 14:26

## 2023-08-14 NOTE — Patient Instructions (Signed)
 [facility] at [facility] Discharge Instructions   You were seen and examined today by Dr. Cheree Cords.  He reviewed the results of your lab work which are normal/stable.   We will hold your treatment today. We will repeat a scan in one month.   Return as scheduled.    Thank you for choosing [facility] at [facility] to provide your oncology and hematology care.  To afford each patient quality time with our provider, please arrive at least 15 minutes before your scheduled appointment time.   If you have a lab appointment with the Cancer Center please come in thru the Main Entrance and check in at the main information desk.  You need to re-schedule your appointment should you arrive 10 or more minutes late.  We strive to give you quality time with our providers, and arriving late affects you and other patients whose appointments are after yours.  Also, if you no show three or more times for appointments you may be dismissed from the clinic at the providers discretion.     Again, thank you for choosing [facility].  Our hope is that these requests will decrease the amount of time that you wait before being seen by our physicians.       _____________________________________________________________  Should you have questions after your visit to [facility], please contact our office at [phone] and follow the prompts.  Our office hours are 8:00 a.m. and 4:30 p.m. Monday - Friday.  Please note that voicemails left after 4:00 p.m. may not be returned until the following business day.  We are closed weekends and major holidays.  You do have access to a nurse 24-7, just call the main number to the clinic [phone] and do not press any options, hold on the line and a nurse will answer the phone.    For prescription refill requests, have your pharmacy contact our office and allow 72 hours.    Due to Covid, you will need to  wear a mask upon entering the hospital. If you do not have a mask, a mask will be given to you at the Main Entrance upon arrival. For doctor visits, patients may have 1 support person age 52 or older with them. For treatment visits, patients can not have anyone with them due to social distancing guidelines and our immunocompromised population.

## 2023-08-14 NOTE — Progress Notes (Signed)
 Treatment will be held today per Dr.Katragadda.

## 2023-08-15 ENCOUNTER — Telehealth: Payer: Self-pay | Admitting: *Deleted

## 2023-08-15 DIAGNOSIS — I700 Atherosclerosis of aorta: Secondary | ICD-10-CM | POA: Diagnosis not present

## 2023-08-15 DIAGNOSIS — I119 Hypertensive heart disease without heart failure: Secondary | ICD-10-CM | POA: Diagnosis not present

## 2023-08-15 DIAGNOSIS — Z7901 Long term (current) use of anticoagulants: Secondary | ICD-10-CM | POA: Diagnosis not present

## 2023-08-15 DIAGNOSIS — J9611 Chronic respiratory failure with hypoxia: Secondary | ICD-10-CM | POA: Diagnosis not present

## 2023-08-15 DIAGNOSIS — J9811 Atelectasis: Secondary | ICD-10-CM | POA: Diagnosis not present

## 2023-08-15 DIAGNOSIS — K5730 Diverticulosis of large intestine without perforation or abscess without bleeding: Secondary | ICD-10-CM | POA: Diagnosis not present

## 2023-08-15 DIAGNOSIS — C229 Malignant neoplasm of liver, not specified as primary or secondary: Secondary | ICD-10-CM | POA: Diagnosis not present

## 2023-08-15 DIAGNOSIS — I2510 Atherosclerotic heart disease of native coronary artery without angina pectoris: Secondary | ICD-10-CM | POA: Diagnosis not present

## 2023-08-15 DIAGNOSIS — Z9181 History of falling: Secondary | ICD-10-CM | POA: Diagnosis not present

## 2023-08-15 DIAGNOSIS — I350 Nonrheumatic aortic (valve) stenosis: Secondary | ICD-10-CM | POA: Diagnosis not present

## 2023-08-15 DIAGNOSIS — Z96612 Presence of left artificial shoulder joint: Secondary | ICD-10-CM | POA: Diagnosis not present

## 2023-08-15 DIAGNOSIS — C3492 Malignant neoplasm of unspecified part of left bronchus or lung: Secondary | ICD-10-CM | POA: Diagnosis not present

## 2023-08-15 DIAGNOSIS — I10 Essential (primary) hypertension: Secondary | ICD-10-CM | POA: Diagnosis not present

## 2023-08-15 DIAGNOSIS — D509 Iron deficiency anemia, unspecified: Secondary | ICD-10-CM | POA: Diagnosis not present

## 2023-08-15 DIAGNOSIS — K219 Gastro-esophageal reflux disease without esophagitis: Secondary | ICD-10-CM | POA: Diagnosis not present

## 2023-08-15 DIAGNOSIS — E119 Type 2 diabetes mellitus without complications: Secondary | ICD-10-CM | POA: Diagnosis not present

## 2023-08-15 DIAGNOSIS — C7931 Secondary malignant neoplasm of brain: Secondary | ICD-10-CM | POA: Diagnosis not present

## 2023-08-15 DIAGNOSIS — I3139 Other pericardial effusion (noninflammatory): Secondary | ICD-10-CM | POA: Diagnosis not present

## 2023-08-15 DIAGNOSIS — T148XXA Other injury of unspecified body region, initial encounter: Secondary | ICD-10-CM | POA: Diagnosis not present

## 2023-08-15 DIAGNOSIS — Z86711 Personal history of pulmonary embolism: Secondary | ICD-10-CM | POA: Diagnosis not present

## 2023-08-15 DIAGNOSIS — D630 Anemia in neoplastic disease: Secondary | ICD-10-CM | POA: Diagnosis not present

## 2023-08-15 DIAGNOSIS — I2699 Other pulmonary embolism without acute cor pulmonale: Secondary | ICD-10-CM | POA: Diagnosis not present

## 2023-08-15 DIAGNOSIS — Z9981 Dependence on supplemental oxygen: Secondary | ICD-10-CM | POA: Diagnosis not present

## 2023-08-15 DIAGNOSIS — I4891 Unspecified atrial fibrillation: Secondary | ICD-10-CM | POA: Diagnosis not present

## 2023-08-15 DIAGNOSIS — J9621 Acute and chronic respiratory failure with hypoxia: Secondary | ICD-10-CM | POA: Diagnosis not present

## 2023-08-15 DIAGNOSIS — Z96641 Presence of right artificial hip joint: Secondary | ICD-10-CM | POA: Diagnosis not present

## 2023-08-15 NOTE — Telephone Encounter (Signed)
 Copied from CRM [id number]. Topic: Clinical - Medical Advice >> Aug 10, 2023  3:38 PM Hilton Lucky wrote: Reason for CRM: Patient is calling in to follow up from visit yesterday. Patient states she would like to relay that she is on the medication DILITAZEA 2H ERITD 20MG  - One Tablet Daily, Patient wanted to inform provider this medication should be on the list.  I called and spoke with the pt  Dr Waymond Hailey asked her to call back with any medication discrepancies after her recent visit  She states diltiazem  was left off the list  She takes this once daily  I assured her it's already on her med list  Nothing further needed

## 2023-08-21 ENCOUNTER — Telehealth: Payer: Self-pay | Admitting: Cardiology

## 2023-08-21 MED ORDER — DILTIAZEM HCL ER COATED BEADS 120 MG PO CP24
120.0000 mg | ORAL_CAPSULE | Freq: Every day | ORAL | 1 refills | Status: DC
Start: 2023-08-21 — End: 2023-09-27

## 2023-08-21 NOTE — Telephone Encounter (Signed)
 Pt's medication was sent to pt's pharmacy as requested. Confirmation received.

## 2023-08-21 NOTE — Telephone Encounter (Signed)
 Patient needs refill on Diltiazem  120 MG

## 2023-08-22 DIAGNOSIS — Z9981 Dependence on supplemental oxygen: Secondary | ICD-10-CM | POA: Diagnosis not present

## 2023-08-22 DIAGNOSIS — Z7901 Long term (current) use of anticoagulants: Secondary | ICD-10-CM | POA: Diagnosis not present

## 2023-08-22 DIAGNOSIS — J9621 Acute and chronic respiratory failure with hypoxia: Secondary | ICD-10-CM | POA: Diagnosis not present

## 2023-08-22 DIAGNOSIS — C3492 Malignant neoplasm of unspecified part of left bronchus or lung: Secondary | ICD-10-CM | POA: Diagnosis not present

## 2023-08-22 DIAGNOSIS — D630 Anemia in neoplastic disease: Secondary | ICD-10-CM | POA: Diagnosis not present

## 2023-08-22 DIAGNOSIS — E119 Type 2 diabetes mellitus without complications: Secondary | ICD-10-CM | POA: Diagnosis not present

## 2023-08-22 DIAGNOSIS — I3139 Other pericardial effusion (noninflammatory): Secondary | ICD-10-CM | POA: Diagnosis not present

## 2023-08-22 DIAGNOSIS — C229 Malignant neoplasm of liver, not specified as primary or secondary: Secondary | ICD-10-CM | POA: Diagnosis not present

## 2023-08-22 DIAGNOSIS — Z86711 Personal history of pulmonary embolism: Secondary | ICD-10-CM | POA: Diagnosis not present

## 2023-08-22 DIAGNOSIS — Z96612 Presence of left artificial shoulder joint: Secondary | ICD-10-CM | POA: Diagnosis not present

## 2023-08-22 DIAGNOSIS — I2510 Atherosclerotic heart disease of native coronary artery without angina pectoris: Secondary | ICD-10-CM | POA: Diagnosis not present

## 2023-08-22 DIAGNOSIS — I700 Atherosclerosis of aorta: Secondary | ICD-10-CM | POA: Diagnosis not present

## 2023-08-22 DIAGNOSIS — Z96641 Presence of right artificial hip joint: Secondary | ICD-10-CM | POA: Diagnosis not present

## 2023-08-22 DIAGNOSIS — J9811 Atelectasis: Secondary | ICD-10-CM | POA: Diagnosis not present

## 2023-08-22 DIAGNOSIS — I350 Nonrheumatic aortic (valve) stenosis: Secondary | ICD-10-CM | POA: Diagnosis not present

## 2023-08-22 DIAGNOSIS — Z9181 History of falling: Secondary | ICD-10-CM | POA: Diagnosis not present

## 2023-08-22 DIAGNOSIS — D509 Iron deficiency anemia, unspecified: Secondary | ICD-10-CM | POA: Diagnosis not present

## 2023-08-22 DIAGNOSIS — I119 Hypertensive heart disease without heart failure: Secondary | ICD-10-CM | POA: Diagnosis not present

## 2023-08-22 DIAGNOSIS — K219 Gastro-esophageal reflux disease without esophagitis: Secondary | ICD-10-CM | POA: Diagnosis not present

## 2023-08-22 DIAGNOSIS — K5730 Diverticulosis of large intestine without perforation or abscess without bleeding: Secondary | ICD-10-CM | POA: Diagnosis not present

## 2023-08-22 DIAGNOSIS — I4891 Unspecified atrial fibrillation: Secondary | ICD-10-CM | POA: Diagnosis not present

## 2023-08-22 DIAGNOSIS — C7931 Secondary malignant neoplasm of brain: Secondary | ICD-10-CM | POA: Diagnosis not present

## 2023-09-01 ENCOUNTER — Other Ambulatory Visit: Payer: Self-pay

## 2023-09-03 ENCOUNTER — Inpatient Hospital Stay: Admitting: Hematology

## 2023-09-03 ENCOUNTER — Ambulatory Visit: Payer: Medicare HMO | Attending: Cardiology | Admitting: Cardiology

## 2023-09-03 ENCOUNTER — Inpatient Hospital Stay

## 2023-09-03 ENCOUNTER — Encounter: Payer: Self-pay | Admitting: Cardiology

## 2023-09-03 ENCOUNTER — Inpatient Hospital Stay: Attending: Neurosurgery | Admitting: Dietician

## 2023-09-03 ENCOUNTER — Telehealth: Payer: Self-pay | Admitting: Dietician

## 2023-09-03 VITALS — BP 104/66 | HR 86 | Ht 62.0 in | Wt 193.0 lb

## 2023-09-03 DIAGNOSIS — I890 Lymphedema, not elsewhere classified: Secondary | ICD-10-CM

## 2023-09-03 DIAGNOSIS — C7801 Secondary malignant neoplasm of right lung: Secondary | ICD-10-CM | POA: Insufficient documentation

## 2023-09-03 DIAGNOSIS — I480 Paroxysmal atrial fibrillation: Secondary | ICD-10-CM | POA: Diagnosis not present

## 2023-09-03 DIAGNOSIS — Z86711 Personal history of pulmonary embolism: Secondary | ICD-10-CM | POA: Insufficient documentation

## 2023-09-03 DIAGNOSIS — C787 Secondary malignant neoplasm of liver and intrahepatic bile duct: Secondary | ICD-10-CM | POA: Insufficient documentation

## 2023-09-03 DIAGNOSIS — Z803 Family history of malignant neoplasm of breast: Secondary | ICD-10-CM | POA: Insufficient documentation

## 2023-09-03 DIAGNOSIS — C3412 Malignant neoplasm of upper lobe, left bronchus or lung: Secondary | ICD-10-CM | POA: Insufficient documentation

## 2023-09-03 DIAGNOSIS — Z87891 Personal history of nicotine dependence: Secondary | ICD-10-CM | POA: Insufficient documentation

## 2023-09-03 DIAGNOSIS — Z79899 Other long term (current) drug therapy: Secondary | ICD-10-CM | POA: Insufficient documentation

## 2023-09-03 DIAGNOSIS — C3492 Malignant neoplasm of unspecified part of left bronchus or lung: Secondary | ICD-10-CM | POA: Diagnosis not present

## 2023-09-03 DIAGNOSIS — I350 Nonrheumatic aortic (valve) stenosis: Secondary | ICD-10-CM

## 2023-09-03 DIAGNOSIS — I272 Pulmonary hypertension, unspecified: Secondary | ICD-10-CM | POA: Diagnosis not present

## 2023-09-03 DIAGNOSIS — D649 Anemia, unspecified: Secondary | ICD-10-CM | POA: Insufficient documentation

## 2023-09-03 DIAGNOSIS — D329 Benign neoplasm of meninges, unspecified: Secondary | ICD-10-CM | POA: Insufficient documentation

## 2023-09-03 DIAGNOSIS — Z7901 Long term (current) use of anticoagulants: Secondary | ICD-10-CM | POA: Insufficient documentation

## 2023-09-03 MED ORDER — MIDODRINE HCL 10 MG PO TABS
10.0000 mg | ORAL_TABLET | Freq: Three times a day (TID) | ORAL | 1 refills | Status: DC
Start: 2023-09-03 — End: 2023-09-27

## 2023-09-03 MED ORDER — FUROSEMIDE 20 MG PO TABS: 40.0000 mg | ORAL_TABLET | Freq: Every day | ORAL | 1 refills | Status: DC | PRN

## 2023-09-03 NOTE — Telephone Encounter (Signed)
 Nutrition Assessment   Reason for Assessment: +MST   ASSESSMENT: 73 year old female with adenocarcinoma of left lung metastatic to brain, right lung and liver. She is receiving maintenance alimta q21d. Therapy currently on hold secondary to poor tolerance. Patient is under the care of Dr. Cheree Cords.  Past medical history includes aortic stenosis, HTN, atrial fibrillation, chronic respiratory failure with hypoxia, colitis, SBO, cerebral meningioma, IDA  Admissions: 4/6-4/9 - resp failure/rt loculated pleural effusion 3/26-3/29 - bilateral pleural effusion s/p thoracentesis (-550 ml) on 3/28   Spoke with patient via telephone. Pt is not doing too good today secondary to persistent shortness of breath. She is on 3L O2 increasing to 4L on exertion. Pt reports poor appetite related to SOB and early satiety. Pt says she can eat a half sandwich and be uncomfortably full. Eating twice daily (half sandwich, peanut butter crackers, banana). Pt reports feeling unwell after po at times. States she gets hot-faced and this resolves after bowel movement. Patient reports constipation. Bowels move every 3 or so days. She takes colace, however does not find this beneficial. Patient does not drink much fluid. Reports one sundrop and enough water  to take medications. Patient has protein shake on occasion. She had one this morning. This sent her to the bathroom.    Nutrition Focused Physical Exam: unable to complete - telephone    Medications: xanax , tums, B12, cardizem , ferrous sulfate , folic acid , lasix , gabapentin , mag-ox, midodrine , MVI, prilosec, xarelto    Labs: 5/27 - BUN 5, albumin 2.6   Anthropometrics:   Height: 5'2 Weight: 189 lb 13.1 oz (5/27) UBW: 215-220 lb BMI: 34.72   NUTRITION DIAGNOSIS: Unintended wt loss related to cancer as evidenced by increased work of breathing, poor appetite, 13% wt loss in 4 months. Per chart, pt 216 lb 14.9 oz on 1/27   INTERVENTION:  Educated on small  frequent meals/snacks vs 2 meals Recommend soft moist foods for ease of intake, ideas offered - will mail handout with list of foods Educated on strategies for constipation and importance of hydration. Pt will work to increase fluid intake. Recommend daily stool softener Recommend 1-2 Ensure Plus/equivalent - will mail coupons  MONITORING, EVALUATION, GOAL: Pt will tolerate increased calories and protein to minimize further wt loss    Next Visit: Monday July 14 via telephone

## 2023-09-03 NOTE — Progress Notes (Signed)
    Cardiology Office Note  Date: 09/03/2023   ID: Denise [name], DOB [birth year], MRN [id number]  History of Present Illness: Denise [name] is a 73 y.o. female last seen in December 2024 by Ms. Strader PA-C, I reviewed her note.  She is here for a follow-up visit.  Reports NYHA class IV dyspnea, on continuous oxygen  supplementation.  She continues to follow with Dr. Katragadda for management of metastatic adenocarcinoma of the lung to liver.  Follow-up chest imaging pending and further discussion regarding resumption of chemotherapy.  We went over her medications.  She reports compliance with treatment.  I reviewed her interval lab work which is noted below.  Physical Exam: VS:  BP 104/66   Pulse 86   Ht 5' 2 (1.575 m)   Wt 193 lb (87.5 kg)   SpO2 93%   BMI 35.30 kg/m , BMI Body mass index is 35.3 kg/m.  Wt Readings from Last 3 Encounters:  09/03/23 193 lb (87.5 kg)  08/14/23 189 lb 13.1 oz (86.1 kg)  08/09/23 193 lb (87.5 kg)    General: Wearing oxygen  via nasal cannula. HEENT: Conjunctiva and lids normal. Neck: Supple, no elevated JVP or carotid bruits. Lungs: Decreased, coarse breath sounds with scattered rhonchi. Cardiac: Regular rate and rhythm with ectopy, no S3, 3/6 systolic murmur. Extremities: Stable lymphedema.  ECG:  An ECG dated 06/25/2023 was personally reviewed today and demonstrated:  Sinus tachycardia with right branch block and left anterior fascicular block.  Labwork: 05/21/2023: TSH 1.412 06/24/2023: B Natriuretic Peptide 406.0 08/14/2023: ALT 8; AST 14; BUN 5; Creatinine, Ser 0.63; Hemoglobin 10.0; Magnesium  1.8; Platelets 335; Potassium 3.7; Sodium 143     Component Value Date/Time   CHOL 81 01/23/2023 1212   TRIG 72 01/23/2023 1212   HDL 35 (L) 01/23/2023 1212   CHOLHDL 2.3 01/23/2023 1212   VLDL 14 01/23/2023 1212   LDLCALC 32 01/23/2023 1212   Other Studies Reviewed Today:  No interval cardiac testing for review today.  Assessment and Plan:  1.   Calcific aortic stenosis, moderate to severe with mean AV gradient 42 mmHg, dimensionless index of 0.36, and also moderate aortic regurgitation by echocardiogram in May 2024.  We have held off follow-up imaging given poor candidacy for TAVR in light of comorbid illnesses including metastatic adenocarcinoma of the lungs.   2.  Severe pulmonary hypertension with RV dysfunction, likely WHO group 3 or 4 in the setting of chronic lung disease with stage IV adenocarcinoma and also pulmonary emboli.  She remains on anticoagulation in the form of Xarelto  20 mg daily.   3.  Normal coronary arteries at cardiac catheterization in 2021.  4.  Orthostatic hypotension.  Symptomatically stable on midodrine  10 mg p.o. 3 times daily.  5.  Paroxysmal atrial fibrillation with CHA2DS2-VASc score of 3.  Continue Xarelto  20 mg daily.  She is also on diltiazem  120 mg daily.  Disposition:  Follow up virtual visit in 6 months.  Signed, Gerard Knight, M.D., F.A.C.C. [facility]

## 2023-09-03 NOTE — Patient Instructions (Signed)
 Medication Instructions:   Take Lasix  40 mg daily as needed for swelling   Take Midodrine  10 mg Three times a day  Labwork: None   Testing/Procedures: None   Follow-Up: Virtual Visit (phone visit) in 6 months  Any Other Special Instructions Will Be Listed Below (If Applicable).  If you need a refill on your cardiac medications before your next appointment, please call your pharmacy.

## 2023-09-04 ENCOUNTER — Other Ambulatory Visit: Payer: Self-pay

## 2023-09-04 ENCOUNTER — Ambulatory Visit (HOSPITAL_COMMUNITY)
Admission: RE | Admit: 2023-09-04 | Discharge: 2023-09-04 | Disposition: A | Discharge status: Home / Self Care | Source: Ambulatory Visit | Attending: Hematology | Admitting: Hematology

## 2023-09-04 DIAGNOSIS — J90 Pleural effusion, not elsewhere classified: Secondary | ICD-10-CM | POA: Diagnosis not present

## 2023-09-04 DIAGNOSIS — C3492 Malignant neoplasm of unspecified part of left bronchus or lung: Secondary | ICD-10-CM | POA: Diagnosis not present

## 2023-09-04 DIAGNOSIS — K769 Liver disease, unspecified: Secondary | ICD-10-CM | POA: Diagnosis not present

## 2023-09-04 DIAGNOSIS — J8410 Pulmonary fibrosis, unspecified: Secondary | ICD-10-CM | POA: Diagnosis not present

## 2023-09-04 DIAGNOSIS — I3139 Other pericardial effusion (noninflammatory): Secondary | ICD-10-CM | POA: Diagnosis not present

## 2023-09-04 MED ORDER — HEPARIN SOD (PORK) LOCK FLUSH 100 UNIT/ML IV SOLN
INTRAVENOUS | Status: AC
  Filled 2023-09-04: qty 5

## 2023-09-04 MED ORDER — IOHEXOL 300 MG/ML  SOLN
100.0000 mL | Freq: Once | INTRAMUSCULAR | Status: AC | PRN
  Administered 2023-09-04: 100 mL via INTRAVENOUS

## 2023-09-04 MED ORDER — HEPARIN SOD (PORK) LOCK FLUSH 100 UNIT/ML IV SOLN
500.0000 [IU] | Freq: Once | INTRAVENOUS | Status: AC
  Administered 2023-09-04: 500 [IU] via INTRAVENOUS

## 2023-09-05 ENCOUNTER — Other Ambulatory Visit: Payer: Self-pay

## 2023-09-07 ENCOUNTER — Other Ambulatory Visit: Payer: Self-pay

## 2023-09-07 DIAGNOSIS — I2699 Other pulmonary embolism without acute cor pulmonale: Secondary | ICD-10-CM | POA: Diagnosis not present

## 2023-09-07 DIAGNOSIS — T148XXA Other injury of unspecified body region, initial encounter: Secondary | ICD-10-CM | POA: Diagnosis not present

## 2023-09-07 DIAGNOSIS — E119 Type 2 diabetes mellitus without complications: Secondary | ICD-10-CM | POA: Diagnosis not present

## 2023-09-07 DIAGNOSIS — I10 Essential (primary) hypertension: Secondary | ICD-10-CM | POA: Diagnosis not present

## 2023-09-11 ENCOUNTER — Inpatient Hospital Stay: Admitting: Hematology

## 2023-09-11 ENCOUNTER — Inpatient Hospital Stay

## 2023-09-11 VITALS — BP 133/87 | HR 84 | Temp 97.8°F | Resp 21 | Wt 177.5 lb

## 2023-09-11 DIAGNOSIS — K769 Liver disease, unspecified: Secondary | ICD-10-CM

## 2023-09-11 DIAGNOSIS — C3490 Malignant neoplasm of unspecified part of unspecified bronchus or lung: Secondary | ICD-10-CM | POA: Diagnosis not present

## 2023-09-11 DIAGNOSIS — Z803 Family history of malignant neoplasm of breast: Secondary | ICD-10-CM | POA: Diagnosis not present

## 2023-09-11 DIAGNOSIS — Z7901 Long term (current) use of anticoagulants: Secondary | ICD-10-CM | POA: Diagnosis not present

## 2023-09-11 DIAGNOSIS — Z86711 Personal history of pulmonary embolism: Secondary | ICD-10-CM | POA: Diagnosis not present

## 2023-09-11 DIAGNOSIS — C787 Secondary malignant neoplasm of liver and intrahepatic bile duct: Secondary | ICD-10-CM

## 2023-09-11 DIAGNOSIS — C3492 Malignant neoplasm of unspecified part of left bronchus or lung: Secondary | ICD-10-CM

## 2023-09-11 DIAGNOSIS — D649 Anemia, unspecified: Secondary | ICD-10-CM | POA: Diagnosis not present

## 2023-09-11 DIAGNOSIS — Z87891 Personal history of nicotine dependence: Secondary | ICD-10-CM | POA: Diagnosis not present

## 2023-09-11 DIAGNOSIS — D329 Benign neoplasm of meninges, unspecified: Secondary | ICD-10-CM | POA: Diagnosis not present

## 2023-09-11 DIAGNOSIS — Z79899 Other long term (current) drug therapy: Secondary | ICD-10-CM | POA: Diagnosis not present

## 2023-09-11 DIAGNOSIS — C7801 Secondary malignant neoplasm of right lung: Secondary | ICD-10-CM | POA: Diagnosis not present

## 2023-09-11 DIAGNOSIS — C3412 Malignant neoplasm of upper lobe, left bronchus or lung: Secondary | ICD-10-CM | POA: Diagnosis not present

## 2023-09-11 DIAGNOSIS — Z95828 Presence of other vascular implants and grafts: Secondary | ICD-10-CM

## 2023-09-11 LAB — CBC WITH DIFFERENTIAL/PLATELET
Abs Immature Granulocytes: 0.02 10*3/uL (ref 0.00–0.07)
Basophils Absolute: 0 10*3/uL (ref 0.0–0.1)
Basophils Relative: 0 %
Eosinophils Absolute: 0.1 10*3/uL (ref 0.0–0.5)
Eosinophils Relative: 1 %
HCT: 35.5 % — ABNORMAL LOW (ref 36.0–46.0)
Hemoglobin: 10.3 g/dL — ABNORMAL LOW (ref 12.0–15.0)
Immature Granulocytes: 0 %
Lymphocytes Relative: 12 %
Lymphs Abs: 0.7 10*3/uL (ref 0.7–4.0)
MCH: 28.1 pg (ref 26.0–34.0)
MCHC: 29 g/dL — ABNORMAL LOW (ref 30.0–36.0)
MCV: 97 fL (ref 80.0–100.0)
Monocytes Absolute: 0.5 10*3/uL (ref 0.1–1.0)
Monocytes Relative: 9 %
Neutro Abs: 4.3 10*3/uL (ref 1.7–7.7)
Neutrophils Relative %: 78 %
Platelets: 310 10*3/uL (ref 150–400)
RBC: 3.66 MIL/uL — ABNORMAL LOW (ref 3.87–5.11)
RDW: 15.2 % (ref 11.5–15.5)
WBC: 5.6 10*3/uL (ref 4.0–10.5)
nRBC: 0 % (ref 0.0–0.2)

## 2023-09-11 LAB — COMPREHENSIVE METABOLIC PANEL WITH GFR
ALT: 8 U/L (ref 0–44)
AST: 15 U/L (ref 15–41)
Albumin: 2.9 g/dL — ABNORMAL LOW (ref 3.5–5.0)
Alkaline Phosphatase: 82 U/L (ref 38–126)
Anion gap: 8 (ref 5–15)
BUN: 6 mg/dL — ABNORMAL LOW (ref 8–23)
CO2: 38 mmol/L — ABNORMAL HIGH (ref 22–32)
Calcium: 9.1 mg/dL (ref 8.9–10.3)
Chloride: 95 mmol/L — ABNORMAL LOW (ref 98–111)
Creatinine, Ser: 0.64 mg/dL (ref 0.44–1.00)
GFR, Estimated: >60 mL/min (ref >60)
Glucose, Bld: 108 mg/dL — ABNORMAL HIGH (ref 70–99)
Potassium: 3.8 mmol/L (ref 3.5–5.1)
Sodium: 141 mmol/L (ref 135–145)
Total Bilirubin: 0.2 mg/dL (ref 0.0–1.2)
Total Protein: 6.6 g/dL (ref 6.5–8.1)

## 2023-09-11 LAB — MAGNESIUM: Magnesium: 1.9 mg/dL (ref 1.7–2.4)

## 2023-09-11 MED ORDER — SODIUM CHLORIDE 0.9% FLUSH
10.0000 mL | INTRAVENOUS | Status: DC | PRN
  Administered 2023-09-11: 10 mL via INTRAVENOUS

## 2023-09-11 MED ORDER — HEPARIN SOD (PORK) LOCK FLUSH 100 UNIT/ML IV SOLN
500.0000 [IU] | Freq: Once | INTRAVENOUS | Status: AC
  Administered 2023-09-11: 500 [IU] via INTRAVENOUS

## 2023-09-11 NOTE — Progress Notes (Signed)
 Denise [name] [address], [place] [address]    Clinic Day:  09/11/2023  Referring physician: Leigh Lung, MD  Patient Care Team: Denise Lung, MD as PCP - General (Family Medicine) Denise Jayson MATSU, MD as PCP - Cardiology (Cardiology) Denise Hai, MD as Medical Oncologist (Medical Oncology) Denise Ozell NOVAK, MD as Consulting Physician (Pulmonary Disease)   ASSESSMENT & PLAN:   Assessment: 1.  Adenocarcinoma of left [name] (HCC) -Chemoradiation therapy with carboplatin  and paclitaxel  from 01/07/2019 through 02/11/2019. -Consolidation immunotherapy with durvalumab  from 03/19/2019 through 04/15/2018, held due to pneumonitis. -CT chest on 07/02/2019 shows left upper lobe [name] mass measuring 2.6 x 2.0 cm.  It shows improvement in size. -MRI of the brain on 07/18/2019 showed left sphenoid wing meningioma measuring 2.6 x 2.0 x 2.6 cm unchanged.  No new enhancing intracranial lesion.  Increased left temporal white matter edema. -Durvalumab  restarted on 07/24/2019. -She was evaluated by cardiology with a stress test.  EF was 46%.  She underwent cardiac catheterization which did not show any abnormalities. - 1 year of durvalumab  completed on 07/23/2020. - Liver mass biopsy (08/08/2021): Adenocarcinoma, CK7 positive, negative for TTF-1, Napsin a, GATA3, ER, CK20, CDX2 - PET scan (08/25/2021): Right upper lobe [name] nodule 9 mm, SUV 5.0.  Groundglass and solid nodule periphery of the right upper lobe 8 mm, SUV 2.45.  Right lobe liver mass 4.5 x 3.6 cm, SUV 7.78. - NGS testing: PD-L1 negative, TMB-low, MSI-stable, K-ras G12 D.  No other targetable mutations. - Carboplatin , pemetrexed  and pembrolizumab  4 cycles from 09/22/2021 through 11/25/2021, followed by maintenance pemetrexed  and pembrolizumab .  Last pemetrexed  and pembrolizumab  on 11/30/2022.  Pembrolizumab  discontinued due to colitis. -  CT CAP (11/10/2021): Reduced size of liver mass and interval resolution of previously seen  groundglass pulmonary nodules.  There is a new inflammatory right upper lobe nodule.    Plan: 1.  Metastatic adenocarcinoma of the [name] to the liver and right [name]: - Last pemetrexed  treatment was on 05/21/2023. - We are holding treatment last few times due to poor functional status. - She lost 12 pounds in the last 4 weeks.  She reported she was taking Xanax  0.5 mg at bedtime for anxiety. - Reviewed labs from 09/11/2023: Creatinine and LFTs are normal.  All been improved to 2.9.  CBC grossly normal. - CT CAP on 09/04/2023: Similar size of the hypodense lesion in the right lobe of the liver with ill-defined margin of hyperenhancing soft tissue measuring 4.7 x 4.2 cm, previously 3.9 x 3.7 cm.  No new lesions seen. - Recommend MRI of the liver with and without contrast.  Hold treatment today.  RTC after the scan.   2.  Meningioma: - MRI brain on 10/02/2022 showed stable left sphenoid wing meningioma.  If she has any symptoms, consider repeat imaging.  3.  Unprovoked pulmonary embolism: - Continue Xarelto  20 mg daily indefinitely.  No bleeding issues reported.   4.  Leg swellings: - Lasix  is on hold since discharge from the hospital.  She is wrapping her lower extremities.  Allman improved to 2.9 from 2.6 at last visit.   5.  Normocytic anemia: - Hemoglobin is stable at 10.3.  Anemia from myelosuppression.   6.  Hypomagnesemia: - Continue magnesium  twice daily.  Magnesium  is normal.    Orders Placed This Encounter  Procedures   MR LIVER W WO CONTRAST    Standing Status:   Future    Expected Date:   09/18/2023  Expiration Date:   09/10/2024    If indicated for the ordered procedure, I authorize the administration of contrast media per Radiology protocol:   Yes    What is the patient's sedation requirement?:   No Sedation    Does the patient have a pacemaker or implanted devices?:   No    Release to patient:   Immediate    Preferred imaging location?:   [facility] (table limit -  500lbs)      Denise [name],acting as a scribe for Denise Stands, MD.,have documented all relevant documentation on the behalf of Denise Stands, MD,as directed by  Denise Stands, MD while in the presence of Denise Stands, MD.  I, Denise Stands MD, have reviewed the above documentation for accuracy and completeness, and I agree with the above.      Denise Stands, MD   6/24/20251:47 PM  CHIEF COMPLAINT:   Diagnosis: stage IV adenocarcinoma of the left [name]    Cancer Staging  Adenocarcinoma of left [name] [facility]) Staging form: [name], AJCC 8th Edition - Clinical stage from 01/02/2019: Stage IIIB (cT3, cN2, cM0) - Signed by [name] Alean, MD on 01/02/2019 - Pathologic stage from 09/07/2021: Stage IVA (pTX, pNX, pM1b) - Unsigned    Prior Therapy: 1. Chemoradiation with carboplatin  and paclitaxel  from 01/07/2019 to 02/11/2019. 2. Consolidation with durvalumab  from 03/19/2019 to 04/16/2019, held due to pneumonitis.  Current Therapy:  Pemetrexed  and pembrolizumab  maintenance    HISTORY OF PRESENT ILLNESS:   Oncology History  Adenocarcinoma of left [name] (HCC)  12/09/2018 Initial Diagnosis   Adenocarcinoma of left [name] (HCC)   01/02/2019 Cancer Staging   Staging form: [name], AJCC 8th Edition - Clinical stage from 01/02/2019: Stage IIIB (cT3, cN2, cM0) - Signed by [name] Alean, MD on 01/02/2019   01/07/2019 - 02/11/2019 Chemotherapy   The patient had palonosetron  (ALOXI ) injection 0.25 mg, 0.25 mg, Intravenous,  Once, 6 of 6 cycles Administration: 0.25 mg (01/07/2019), 0.25 mg (01/14/2019), 0.25 mg (01/21/2019), 0.25 mg (01/28/2019), 0.25 mg (02/04/2019), 0.25 mg (02/11/2019) CARBOplatin  (PARAPLATIN ) 270 mg in sodium chloride  0.9 % 250 mL chemo infusion, 270 mg (100 % of original dose 266.4 mg), Intravenous,  Once, 6 of 6 cycles Dose modification:   (original dose 266.4 mg, Cycle 1),   (original dose 266.4 mg, Cycle 2),   (original dose  266.4 mg, Cycle 3),   (original dose 266.4 mg, Cycle 4) Administration: 270 mg (01/07/2019), 270 mg (01/14/2019), 270 mg (01/21/2019), 270 mg (01/28/2019), 270 mg (02/04/2019), 270 mg (02/11/2019) PACLitaxel  (TAXOL ) 108 mg in sodium chloride  0.9 % 250 mL chemo infusion (</= 80mg /m2), 45 mg/m2 = 108 mg, Intravenous,  Once, 6 of 6 cycles Administration: 108 mg (01/07/2019), 108 mg (01/14/2019), 108 mg (01/21/2019), 108 mg (01/28/2019), 108 mg (02/04/2019), 108 mg (02/11/2019) fosaprepitant  (EMEND) 150 mg, dexamethasone  (DECADRON ) 12 mg in sodium chloride  0.9 % 145 mL IVPB, , Intravenous,  Once, 5 of 5 cycles Administration:  (01/14/2019),  (01/21/2019),  (01/28/2019),  (02/04/2019),  (02/11/2019)  for chemotherapy treatment.    03/19/2019 - 07/23/2020 Chemotherapy   Patient is on Treatment Plan : [name] DURVALUMAB  Q14D     09/22/2021 - 11/04/2021 Chemotherapy   Patient is on Treatment Plan : [name] Carboplatin  (5) + Pemetrexed  (500) + Pembrolizumab  (200) D1 q21d Induction x 4 cycles / Maintenance Pemetrexed  (500) + Pembrolizumab  (200) D1 q21d     09/22/2021 -  Chemotherapy   Patient is on Treatment Plan : [name] Maintenance Alimta q 21 days  INTERVAL HISTORY:   Denise [name] is a 73 y.o. female presenting to clinic today for follow up of stage IV adenocarcinoma of the left [name]. She was last seen by me on 08/14/23.  Since her last visit, she underwent CT CAP on 09/04/23 that found: Unchanged post treatment appearance of the left [name], with extensive, fibrotic consolidation and bronchiectasis of the superior segment. Slightly diminished volume of a small, loculated right pleural effusion, with a similar small left pleural effusion. Extensive pleural thickening bilaterally. Similar size of hypodense lesion in the right lobe of the liver with an ill-defined margin of hyperenhancing soft tissue measuring 4.7 x 4.2 cm, perhaps slightly enlarged compared to prior examination at which time it measured approximately 3.9 x  3.7 cm. This is suspicious for an enlarging hepatic metastasis. Cardiomegaly and dense aortic valve calcifications.  Today, she states that she is doing well overall. Her appetite level is at 0%. Her energy level is at 0%.   Berit states her pulmonologist recently put her on 3 L of oxygen . She has been started on Xanax  by her PCP due to anxiety. She believes Xanax  is effective in improving anxiety.   She reports a decreased appetite and can only eat small meals at a time, though she does drink protein shakes. She notes feeling sick after eating that resolves only after having a BM. To have a BM, Joliana requires colace or an enema. She states she drinks plenty of fluids. She has unintentionally lost 12 pounds since her last visit.   Shelly is not requiring lasix .   PAST MEDICAL HISTORY:   Past Medical History: Past Medical History:  Diagnosis Date   Anemia    Aortic stenosis    Arthritis    Cancer (HCC)    [name] cancer 2019   Coronary artery calcification seen on CT scan    Dyspnea    Essential hypertension    GERD (gastroesophageal reflux disease)    History of [name] cancer    Stage III adenocarcinoma status post chemoradiation   Port-A-Cath in place 01/06/2019   Type 2 diabetes mellitus [facility])     Surgical History: Past Surgical History:  Procedure Laterality Date   BIOPSY  09/25/2022   Procedure: BIOPSY;  Surgeon: Cindie Carlin POUR, DO;  [facility]: [facility];  Service: Endoscopy;;   CHOLECYSTECTOMY  1997   COLONOSCOPY     COLONOSCOPY WITH PROPOFOL  N/A 09/25/2022   Procedure: COLONOSCOPY WITH PROPOFOL ;  Surgeon: Cindie Carlin POUR, DO;  Location: [facility];  Service: Endoscopy;  Laterality: N/A;  11:00;ASA 3   ESOPHAGEAL BRUSHING  09/25/2022   Procedure: ESOPHAGEAL BRUSHING;  Surgeon: Cindie Carlin POUR, DO;  Location: [facility];  Service: Endoscopy;;   ESOPHAGOGASTRODUODENOSCOPY (EGD) WITH PROPOFOL  N/A 09/25/2022   Procedure: ESOPHAGOGASTRODUODENOSCOPY (EGD) WITH PROPOFOL ;   Surgeon: Cindie Carlin POUR, DO;  Location: [facility];  Service: Endoscopy;  Laterality: N/A;  11:00AM;ASA 3   GASTRIC BYPASS     INCISIONAL HERNIA REPAIR  04/11/11   IR IMAGING GUIDED PORT INSERTION  12/27/2018   Right   IR US  GUIDE BX ASP/DRAIN  08/08/2021   LAPAROSCOPIC SALPINGOOPHERECTOMY     LAPAROTOMY  04/11/2011   Procedure: EXPLORATORY LAPAROTOMY;  Surgeon: Debby LABOR. Cornett, MD;  Location: [facility];  Service: General;  Laterality: N/A;  closure port hole   POLYPECTOMY  09/25/2022   Procedure: POLYPECTOMY;  Surgeon: Cindie Carlin POUR, DO;  Location: [facility];  Service: Endoscopy;;   RIGHT/LEFT HEART CATH AND CORONARY  ANGIOGRAPHY N/A 12/29/2019   Procedure: RIGHT/LEFT HEART CATH AND CORONARY ANGIOGRAPHY;  Surgeon: Burnard Debby LABOR, MD;  Location: [facility];  Service: Cardiovascular;  Laterality: N/A;   TOTAL HIP ARTHROPLASTY  03/07/2012   Procedure: TOTAL HIP ARTHROPLASTY ANTERIOR APPROACH;  Surgeon: Donnice JONETTA Car, MD;  Location: [facility];  Service: Orthopedics;  Laterality: Right;   TOTAL SHOULDER ARTHROPLASTY Left 01/21/2015   TOTAL SHOULDER ARTHROPLASTY Left 01/21/2015   Procedure: LEFT TOTAL SHOULDER ARTHROPLASTY;  Surgeon: Franky Pointer, MD;  Location: [facility];  Service: Orthopedics;  Laterality: Left;   VAGINAL HYSTERECTOMY      Social History: Social History   Socioeconomic History   Marital status: Single    Spouse name: Not on file   Number of children: Not on file   Years of education: 12th grade   Highest education level: Not on file  Occupational History   Occupation: Employed    Employer: [name]  Tobacco Use   Smoking status: Former    Current packs/day: 0.00    Average packs/day: 1 pack/day for 12.0 years (12.0 ttl pk-yrs)    Types: Cigarettes    Start date: 03/20/1964    Quit date: 03/20/1976    Years since quitting: 47.5    Passive exposure: Past   Smokeless tobacco: Never  Vaping Use   Vaping status: Never Used  Substance and Sexual  Activity   Alcohol  use: No   Drug use: No   Sexual activity: Not Currently  Other Topics Concern   Not on file  Social History Narrative   Not on file   Social Drivers of Health   Financial Resource Strain: Low Risk  (12/06/2018)   Overall Financial Resource Strain (CARDIA)    Difficulty of Paying Living Expenses: Not very hard  Food Insecurity: No Food Insecurity (06/24/2023)   Hunger Vital Sign    Worried About Running Out of Food in the Last Year: Never true    Ran Out of Food in the Last Year: Never true  Transportation Needs: No Transportation Needs (06/24/2023)   PRAPARE - Administrator, Civil Service (Medical): No    Lack of Transportation (Non-Medical): No  Physical Activity: Inactive (12/06/2018)   Exercise Vital Sign    Days of Exercise per Week: 0 days    Minutes of Exercise per Session: 0 min  Stress: No Stress Concern Present (12/09/2018)   Received from [name] - Occupational Stress Questionnaire    Feeling of Stress : Not at all  Social Connections: Unknown (06/24/2023)   Social Connection and Isolation Panel    Frequency of Communication with Friends and Family: More than three times a week    Frequency of Social Gatherings with Friends and Family: Once a week    Attends Religious Services: More than 4 times per year    Active Member of [employer] or Organizations: No    Attends Banker Meetings: Never    Marital Status: Patient declined  Recent Concern: Social Connections - Moderately Isolated (06/13/2023)   Social Connection and Isolation Panel    Frequency of Communication with Friends and Family: More than three times a week    Frequency of Social Gatherings with Friends and Family: Once a week    Attends Religious Services: More than 4 times per year    Active Member of [employer] or Organizations: No    Attends Banker Meetings: Never  Marital Status: Never married  Intimate  Partner Violence: Not At Risk (06/24/2023)   Humiliation, Afraid, Rape, and Kick questionnaire    Fear of Current or Ex-Partner: No    Emotionally Abused: No    Physically Abused: No    Sexually Abused: No    Family History: Family History  Problem Relation Age of Onset   Breast cancer Mother    COPD Mother    Arthritis Mother    Diabetes Mother    Hypertension Mother    Hypertension Father    Diabetes Father    Breast cancer Sister    Thyroid  cancer Brother    Heart attack Brother    Huntington's disease Maternal Grandmother     Current Medications:  Current Outpatient Medications:    acetaminophen  (TYLENOL ) 500 MG tablet, Take 500 mg by mouth every 6 (six) hours as needed for moderate pain., Disp: , Rfl:    albuterol  (PROVENTIL ) (2.5 MG/3ML) 0.083% nebulizer solution, Take 3 mLs (2.5 mg total) by nebulization every 6 (six) hours as needed for wheezing or shortness of breath., Disp: 75 mL, Rfl: 12   albuterol  (VENTOLIN  HFA) 108 (90 Base) MCG/ACT inhaler, Inhale 2 puffs into the lungs every 6 (six) hours as needed for wheezing or shortness of breath., Disp: 6.7 g, Rfl: 0   ALPRAZolam  (XANAX ) 0.25 MG tablet, Take 0.125 mg by mouth daily., Disp: , Rfl:    Calcium  Carbonate Antacid (TUMS PO), Take 2-3 tablets by mouth daily., Disp: , Rfl:    Cyanocobalamin  (VITAMIN B-12) 2500 MCG SUBL, Place 2,500 mcg under the tongue every morning. , Disp: , Rfl:    diltiazem  (CARDIZEM  CD) 120 MG 24 hr capsule, Take 1 capsule (120 mg total) by mouth daily., Disp: 90 capsule, Rfl: 1   ferrous sulfate  325 (65 FE) MG tablet, Take 325 mg by mouth every evening., Disp: , Rfl:    folic acid  (FOLVITE ) 1 MG tablet, TAKE 1 TABLET BY MOUTH EVERY DAY, Disp: 90 tablet, Rfl: 1   furosemide  (LASIX ) 20 MG tablet, Take 2 tablets (40 mg total) by mouth daily as needed for edema., Disp: 60 tablet, Rfl: 1   gabapentin  (NEURONTIN ) 300 MG capsule, Take 300 mg by mouth at bedtime., Disp: , Rfl:     Glycerin-Hypromellose-PEG 400 (DRY EYE RELIEF DROPS OP), Apply 1 drop to eye daily as needed (dry eyes)., Disp: , Rfl:    lidocaine -prilocaine  (EMLA ) cream, Apply a small amount to port a cath site and cover with plastic wrap 1 hour prior to chemotherapy appointments, Disp: 30 g, Rfl: 3   loteprednol  (LOTEMAX ) 0.5 % ophthalmic suspension, Place 1 drop into both eyes 2 (two) times daily., Disp: , Rfl:    magnesium  oxide (MAG-OX) 400 MG tablet, Take 1 tablet by mouth 2 (two) times daily., Disp: , Rfl:    midodrine  (PROAMATINE ) 10 MG tablet, Take 1 tablet (10 mg total) by mouth 3 (three) times daily., Disp: 270 tablet, Rfl: 1   Multiple Vitamin (MULITIVITAMIN WITH MINERALS) TABS, Take 1 tablet by mouth daily., Disp: , Rfl:    omeprazole (PRILOSEC) 20 MG capsule, Take 20 mg by mouth daily., Disp: , Rfl:    rivaroxaban  (XARELTO ) 20 MG TABS tablet, Take 1 tablet (20 mg total) by mouth daily with supper., Disp: 30 tablet, Rfl: 3 No current facility-administered medications for this visit.  Facility-Administered Medications Ordered in Other Visits:    magnesium  sulfate 2 GM/50ML IVPB, , , ,    Allergies: No Known Allergies  REVIEW  OF SYSTEMS:   Review of Systems  Constitutional:  Negative for chills, fatigue and fever.  HENT:   Negative for lump/mass, mouth sores, nosebleeds, sore throat and trouble swallowing.   Eyes:  Negative for eye problems.  Respiratory:  Positive for shortness of breath. Negative for cough.   Cardiovascular:  Negative for chest pain, leg swelling and palpitations.  Gastrointestinal:  Positive for constipation. Negative for abdominal pain, diarrhea, nausea and vomiting.  Genitourinary:  Negative for bladder incontinence, difficulty urinating, dysuria, frequency, hematuria and nocturia.   Musculoskeletal:  Negative for arthralgias, back pain, flank pain, myalgias and neck pain.  Skin:  Negative for itching and rash.  Neurological:  Positive for numbness (and tingling in  hands and feet). Negative for dizziness and headaches.  Hematological:  Does not bruise/bleed easily.  Psychiatric/Behavioral:  Negative for depression, sleep disturbance and suicidal ideas. The patient is not nervous/anxious.   All other systems reviewed and are negative.    VITALS:   There were no vitals taken for this visit.  Wt Readings from Last 3 Encounters:  09/11/23 177 lb 7.5 oz (80.5 kg)  09/03/23 193 lb (87.5 kg)  08/14/23 189 lb 13.1 oz (86.1 kg)    There is no height or weight on file to calculate BMI.  Performance status (ECOG): 1 - Symptomatic but completely ambulatory  PHYSICAL EXAM:   Physical Exam Vitals and nursing note reviewed. Exam conducted with a chaperone present.  Constitutional:      Appearance: Normal appearance.   Cardiovascular:     Rate and Rhythm: Normal rate and regular rhythm.     Pulses: Normal pulses.     Heart sounds: Normal heart sounds.  Pulmonary:     Effort: Pulmonary effort is normal.     Breath sounds: Normal breath sounds.  Abdominal:     Palpations: Abdomen is soft. There is no hepatomegaly, splenomegaly or mass.     Tenderness: There is no abdominal tenderness.   Musculoskeletal:     Right lower leg: No edema.     Left lower leg: No edema.  Lymphadenopathy:     Cervical: No cervical adenopathy.     Right cervical: No superficial, deep or posterior cervical adenopathy.    Left cervical: No superficial, deep or posterior cervical adenopathy.     Upper Body:     Right upper body: No supraclavicular or axillary adenopathy.     Left upper body: No supraclavicular or axillary adenopathy.   Neurological:     General: No focal deficit present.     Mental Status: She is alert and oriented to person, place, and time.   Psychiatric:        Mood and Affect: Mood normal.        Behavior: Behavior normal.     LABS:   CBC     Component Value Date/Time   WBC 5.6 09/11/2023 1231   RBC 3.66 (L) 09/11/2023 1231   HGB 10.3 (L)  09/11/2023 1231   HCT 35.5 (L) 09/11/2023 1231   PLT 310 09/11/2023 1231   MCV 97.0 09/11/2023 1231   MCH 28.1 09/11/2023 1231   MCHC 29.0 (L) 09/11/2023 1231   RDW 15.2 09/11/2023 1231   LYMPHSABS 0.7 09/11/2023 1231   MONOABS 0.5 09/11/2023 1231   EOSABS 0.1 09/11/2023 1231   BASOSABS 0.0 09/11/2023 1231    CMP      Component Value Date/Time   NA 141 09/11/2023 1231   K 3.8 09/11/2023 1231   CL  95 (L) 09/11/2023 1231   CO2 38 (H) 09/11/2023 1231   GLUCOSE 108 (H) 09/11/2023 1231   BUN 6 (L) 09/11/2023 1231   CREATININE 0.64 09/11/2023 1231   CALCIUM  9.1 09/11/2023 1231   PROT 6.6 09/11/2023 1231   ALBUMIN 2.9 (L) 09/11/2023 1231   AST 15 09/11/2023 1231   ALT 8 09/11/2023 1231   ALKPHOS 82 09/11/2023 1231   BILITOT 0.2 09/11/2023 1231   GFRNONAA >60 09/11/2023 1231   GFRAA >60 12/11/2019 0817     No results found for: CEA1, CEA / No results found for: CEA1, CEA No results found for: PSA1 No results found for: CAN199 No results found for: CAN125  No results found for: TOTALPROTELP, ALBUMINELP, A1GS, A2GS, BETS, BETA2SER, GAMS, MSPIKE, SPEI Lab Results  Component Value Date   TIBC 241 (L) 12/20/2022   TIBC 215 (L) 09/18/2022   TIBC 227 (L) 06/05/2022   FERRITIN 715 (H) 12/20/2022   FERRITIN 596 (H) 09/18/2022   FERRITIN 310 (H) 06/05/2022   IRONPCTSAT 12 12/20/2022   IRONPCTSAT 15 09/18/2022   IRONPCTSAT 17 06/05/2022   Lab Results  Component Value Date   LDH 168 06/25/2023   LDH 178 12/29/2022   LDH 185 12/25/2022     STUDIES:   CT CHEST ABDOMEN PELVIS W CONTRAST Result Date: 09/06/2023 CLINICAL DATA:  Metastatic disease evaluation, left [name] cancer * Tracking Code: BO * EXAM: CT CHEST, ABDOMEN, AND PELVIS WITH CONTRAST TECHNIQUE: Multidetector CT imaging of the chest, abdomen and pelvis was performed following the standard protocol during bolus administration of intravenous contrast. RADIATION DOSE REDUCTION: This exam  was performed according to the departmental dose-optimization program which includes automated exposure control, adjustment of the mA and/or kV according to patient size and/or use of iterative reconstruction technique. CONTRAST:  OMNIPAQUE  IOHEXOL  300 MG/ML  SOLN COMPARISON:  CT chest 06/24/2023, CT chest abdomen pelvis, 06/04/2023 FINDINGS: CT CHEST FINDINGS Cardiovascular: Right chest port catheter. Aortic atherosclerosis. Dense aortic valve calcifications. Cardiomegaly. Unchanged small pericardial effusion. Mediastinum/Nodes: No enlarged mediastinal, hilar, or axillary lymph nodes. Thyroid  gland, trachea, and esophagus demonstrate no significant findings. Lungs/Pleura: Slightly diminished volume of a small, loculated right pleural effusion, with a similar small left pleural effusion. Extensive pleural thickening bilaterally. Unchanged post treatment appearance of the left [name], with extensive, fibrotic consolidation and bronchiectasis of the superior segment (series 4, image 44). Diffuse mosaic attenuation throughout the aerated portions of both lungs. Musculoskeletal: No chest wall abnormality. No acute osseous findings. CT ABDOMEN PELVIS FINDINGS Hepatobiliary: Similar size of hypodense lesion in the right lobe of the liver measuring 1.9 x 1.5 cm (series 2, image 66), with an ill-defined margin of hyperenhancing soft tissue measuring 4.7 x 4.2 cm, perhaps slightly enlarged compared to prior examination at which time it measured approximately 3.9 x 3.7 cm (series 2, image 66). Additional small hypodense liver lesions unchanged, least 1 of which in the left lobe of the liver is a benign cyst. Status post cholecystectomy. Unchanged postoperative biliary ductal dilatation. Pancreas: Unremarkable. No pancreatic ductal dilatation or surrounding inflammatory changes. Spleen: Normal in size without significant abnormality. Adrenals/Urinary Tract: Adrenal glands are unremarkable. Kidneys are normal, without renal  calculi, solid lesion, or hydronephrosis. Bladder is unremarkable. Stomach/Bowel: Roux gastric bypass. Appendix appears normal. No evidence of bowel wall thickening, distention, or inflammatory changes. Pancolonic diverticulosis. Vascular/Lymphatic: Aortic atherosclerosis. No enlarged abdominal or pelvic lymph nodes. Reproductive: Densely calcified uterine fibroid. Other: Midline ventral hernia containing multiple loops of nonobstructed small bowel. No ascites.  Musculoskeletal: No acute osseous findings. Status post right hip total arthroplasty. IMPRESSION: 1. Unchanged post treatment appearance of the left [name], with extensive, fibrotic consolidation and bronchiectasis of the superior segment. 2. Slightly diminished volume of a small, loculated right pleural effusion, with a similar small left pleural effusion. Extensive pleural thickening bilaterally. 3. Similar size of hypodense lesion in the right lobe of the liver with an ill-defined margin of hyperenhancing soft tissue measuring 4.7 x 4.2 cm, perhaps slightly enlarged compared to prior examination at which time it measured approximately 3.9 x 3.7 cm. This is suspicious for an enlarging hepatic metastasis. Contrast enhanced MRI may be helpful adjunct to more clearly assess for burden and change in hepatic metastatic disease. 4. Cardiomegaly and dense aortic valve calcifications. Correlate for echocardiographic evidence of aortic valve dysfunction. 5. Other chronic and incidental findings as above. Aortic Atherosclerosis (ICD10-I70.0). Electronically Signed   By: Marolyn JONETTA Jaksch M.D.   On: 09/06/2023 07:52

## 2023-09-11 NOTE — Patient Instructions (Addendum)
 [facility]  Discharge Instructions  You were seen and examined today by Dr. Rogers.  Dr. Rogers discussed your most recent lab work and CT scan which revealed your protein in your blood has improved. The spot on your liver is slightly bigger everything else looks good. Dr. Rogers recommends repeating MRI of liver. Dr. Rogers will see you back after the MRI.  Follow-up as scheduled.    Thank you for choosing [facility] to provide your oncology and hematology care.   To afford each patient quality time with our provider, please arrive at least 15 minutes before your scheduled appointment time. You may need to reschedule your appointment if you arrive late (10 or more minutes). Arriving late affects you and other patients whose appointments are after yours.  Also, if you miss three or more appointments without notifying the office, you may be dismissed from the clinic at the provider's discretion.    Again, thank you for choosing [facility].  Our hope is that these requests will decrease the amount of time that you wait before being seen by our physicians.   If you have a lab appointment with the Cancer Center - please note that after April 8th, all labs will be drawn in the cancer center.  You do not have to check in or register with the main entrance as you have in the past but will complete your check-in at the cancer center.            _____________________________________________________________  Should you have questions after your visit to [facility], please contact our office at [phone] and follow the prompts.  Our office hours are 8:00 a.m. to 4:30 p.m. Monday - Thursday and 8:00 a.m. to 2:30 p.m. Friday.  Please note that voicemails left after 4:00 p.m. may not be returned until the following business day.  We are closed weekends and all major holidays.  You do have access to a  nurse 24-7, just call the main number to the clinic [phone] and do not press any options, hold on the line and a nurse will answer the phone.    For prescription refill requests, have your pharmacy contact our office and allow 72 hours.    Masks are no longer required in the cancer centers. If you would like for your care team to wear a mask while they are taking care of you, please let them know. You may have one support person who is at least 72 years old accompany you for your appointments.

## 2023-09-11 NOTE — Progress Notes (Signed)
 No treatment today per MD.  Patient will go for MRI before next treatment.

## 2023-09-11 NOTE — Progress Notes (Signed)
 Holding treatment today per Dr. Katragadda.

## 2023-09-11 NOTE — Progress Notes (Signed)
 Patients port flushed without difficulty.  Good blood return noted with no bruising or swelling noted at site.  Patient remains accessed for treatment.

## 2023-09-15 ENCOUNTER — Other Ambulatory Visit: Payer: Self-pay | Admitting: Hematology

## 2023-09-15 DIAGNOSIS — T148XXA Other injury of unspecified body region, initial encounter: Secondary | ICD-10-CM | POA: Diagnosis not present

## 2023-09-15 DIAGNOSIS — J9611 Chronic respiratory failure with hypoxia: Secondary | ICD-10-CM | POA: Diagnosis not present

## 2023-09-15 DIAGNOSIS — E119 Type 2 diabetes mellitus without complications: Secondary | ICD-10-CM | POA: Diagnosis not present

## 2023-09-15 DIAGNOSIS — I10 Essential (primary) hypertension: Secondary | ICD-10-CM | POA: Diagnosis not present

## 2023-09-15 DIAGNOSIS — I2699 Other pulmonary embolism without acute cor pulmonale: Secondary | ICD-10-CM | POA: Diagnosis not present

## 2023-09-17 ENCOUNTER — Encounter: Payer: Self-pay | Admitting: Hematology

## 2023-09-17 NOTE — Progress Notes (Unsigned)
 Charde Macfarlane, female    DOB: 04-Mar-1951    MRN: [id number]   Brief patient profile:  5   yobf  doe x 2023 quit smoking 1978   referred to pulmonary clinic in [place]  08/09/2023 by Dr Noralee  for f/u effusions R > L during admit:    Admit date:     06/24/2023  Discharge date: 06/27/23   Recommendations at discharge:  Repeat basic metabolic panel to follow ultralights and renal function Repeat CBC to follow hemoglobin trend/stability Repeat chest x-ray in 4 weeks to assure resolution of infiltrates and follow-up on pleural effusion reaccumulation.   Discharge Diagnoses: Principal Problem:   Acute on chronic respiratory failure (HCC)   Atrial fibrillation with RVR (HCC)   Loculated pleural effusion   Port-A-Cath in place   Pulmonary embolus (HCC)   Type 2 diabetes mellitus (HCC)   Essential hypertension   Aortic stenosis   Adenocarcinoma of left lung (HCC)   Iron deficiency anemia   Hypotension   Brief Hospital admission narrative: As per H&P written by Dr. Pearlean on 06/24/2023 Daionna Crossland is a 73 y.o. female with extensive medical history significant for left lung adenocarcinoma with brain metastasis, chronic respiratory failure on 2 to 4 L, atrial fibrillation, aortic stenosis, hypertension, pulmonary embolism diabetes mellitus. Recent hospitalization 3/26 to 3/29 for acute hypoxic respiratory failure secondary to right pleural effusion, atrial fibrillation with RVR.  She underwent thoracentesis 3/28 with drainage of 550 mL of dark red colored pleural fluid.  Pathology results-no malignant cells identified, cultures and Gram stain were negative.   She presents today with recurrence of her difficulty breathing over the past 2 days.  She denies swelling to her lower extremities.  She reports compliance with her Lasix  and Xarelto .  No chest pain.  No cough.  No fevers no chills.  She denies palpitations, no chest pain.   ED Course: Temperature 98.9.  Heart rate 93-50.   Respiratory rate 24-31.  Blood pressure systolic down to 10/41 improved to 143/116.  O2 sats down to 86% on home 4 L, on 5 L sats > 94%. WBC 9.  BNP 406.  Troponin 19.  COVID influenza RSV negative. EDP reports rhonchi on exam. CT chest with contrast showed slight interval increase in loculated right pleural effusion compared to most recent prior exam.  Able small loculated left pleural effusion.  Also shows similar pattern of consolidations left upper lobe, right middle and bilateral lower lobes compared to recent.  Similar perihilar groundglass density.   Assessment and Plan: 1)Acute on chronic respiratory failure-- She reports compliance with Xarelto .  - CT chest with contrast shows slight increase in loculated right pleural effusion.   - Recent hospitalization, patient underwent thoracentesis 3/28-with drainage of of pleural fluid, pathology was negative for malignant cells, cultures and gram stain negative -ABG-pH of 7.46, pCO2 of 56. -COVID influenza RSV negative WBC 9.0 >>11.4 -Patient antibiotics transition to oral route and planning to pursue 5 more days at discharge -Continue as needed bronchodilator management and oxygen  supplementation -Afebrile and feeling ready to go home.   2)Rt Sided Loculated pleural effusion CT with contrast  slight interval increase in loculated right pleural effusion compared to most recent prior exam from March. Stable small loculated left pleural effusion.  Right effusion was drained during recent hospitalization 06/15/23 -Repeat ultrasound-guided thoracentesis on 06/25/2023 with insufficient fluid for thoracentensis -IR consulted input appreciated; not enough fluid for thoracentesis at this time.   3)Atrial fibrillation with RVR ---rate  control remains challenging -Echo from 12/25/2022 with EF of 65 to 70%  -Continue treatment with extended release Cardizem  and the use of Xarelto  -Coreg  has been discontinued due to ongoing hypotension.   4)DM2-A1c  5.8 reflecting excellent diabetic control PTA Diet controlled and outpatient. --Monitor fingersticks   5)H/o Pulmonary embolus (HCC) -Will continue treatment with Xarelto    6)H/o HTN/hypotension -Currently hypotensive---a.m. cortisol is 11.5 -Continue extended release Cardizem  -Continue treatment with midodrine  10 mg by mouth 3 times a day -Continue to follow blood pressure.   7)Aortic stenosis -Last echo 12/2022-aortic valve appear significantly stenotic.  Per cardiology notes 02/2023, patient not felt to be a candidate for TAVR given metastatic lung cancer. -Continue patient follow-up with cardiology service.   Iron deficiency anemia -Hemoglobin stable at 8.7. -Continue to follow hemoglobin trend. -No transfusion required throughout hospitalization.   Adenocarcinoma of left lung [facility]) Continue outpatient follow-up with Dr. Rogers.  Last visit 06/07/2023, tolerated last treatment well.   -Her next chemo session is scheduled for 07/02/2023 -Patient also scheduled to follow-up as an outpatient with pulmonology service.   Generalized weakness and ambulatory dysfunction--- patient family members are worried that she may be too weak to return home; patient has declined skilled nursing facility placement. -In agreement to go home with home health services (home health RN, PT and aide has been arranged).   Class II obesity -Body mass index is 35.12 kg/m.  -Low-calorie diet, portion control and increasing activity discussed with patient.  07/23/23 oncology note 1.  Adenocarcinoma of left lung (HCC) -Chemoradiation therapy with carboplatin  and paclitaxel  from 01/07/2019 through 02/11/2019. -Consolidation immunotherapy with durvalumab  from 03/19/2019 through 04/15/2018, held due to pneumonitis. -CT chest on 07/02/2019 shows left upper lobe lung mass measuring 2.6 x 2.0 cm.  It shows improvement in size. -MRI of the brain on 07/18/2019 showed left sphenoid wing meningioma measuring 2.6 x  2.0 x 2.6 cm unchanged.  No new enhancing intracranial lesion.  Increased left temporal white matter edema. -Durvalumab  restarted on 07/24/2019. -She was evaluated by cardiology with a stress test.  EF was 46%.  She underwent cardiac catheterization which did not show any abnormalities. - 1 year of durvalumab  completed on 07/23/2020. - Liver mass biopsy (08/08/2021): Adenocarcinoma, CK7 positive, negative for TTF-1, Napsin a, GATA3, ER, CK20, CDX2 - PET scan (08/25/2021): Right upper lobe lung nodule 9 mm, SUV 5.0.  Groundglass and solid nodule periphery of the right upper lobe 8 mm, SUV 2.45.  Right lobe liver mass 4.5 x 3.6 cm, SUV 7.78. - NGS testing: PD-L1 negative, TMB-low, MSI-stable, K-ras G12 D.  No other targetable mutations. - Carboplatin , pemetrexed  and pembrolizumab  4 cycles from 09/22/2021 through 11/25/2021, followed by maintenance pemetrexed  and pembrolizumab .  Last pemetrexed  and pembrolizumab  on 11/30/2022.  Pembrolizumab  discontinued due to colitis. -  CT CAP (11/10/2021): Reduced size of liver mass and interval resolution of previously seen groundglass pulmonary nodules.  There is a new inflammatory right upper lobe nodule.  History of Present Illness  08/09/2023  Pulmonary/ 1st office eval/ Mionna Advincula / [place] [facility]  Chief Complaint  Patient presents with   Establish Care   Shortness of Breath  Dyspnea:  bed to w/c or bsc Cough: none  Sleep: lift chair x 45 degree and no noct resp cc SABA use: once a day / once a day  02: 2 lpm / up to 4lpm walking Rec Make sure you check your oxygen  saturation  AT  your highest level of activity (not after you stop)  to be sure it stays over 90%   Call me with any discrepancies on your medication list  Only use your albuterol  as a rescue medication Use nebulizer up to every 4 hours as needed if you first try the inhaler and it doesn't work Also  Ok to try albuterol  15 min before an activity (on alternating days to see if it makes any diffence between  the inhaler and nebulizer)  that you know would usually make you short of breath      Please schedule a follow up office visit in 6 weeks, call sooner if needed with all medications /inhalers/ solutions in hand  09/20/2023  f/u ov/[place] office/Charmon Thorson re: doe maint on 02  did not  bring meds  Chief Complaint  Patient presents with   Follow-up    DOE  Dyspnea:  recliner  to w/c most activity she can tol Cough: esp in am clear mucus  Sleeping: lift chair/ recliner x 45 degrees  s  resp cc  SABA use: not helping  02: 3lpm bedtime/ 3lpm around house but leave house to 4lpm POC   No obvious day to day or daytime variability or assoc excess/ purulent sputum or mucus plugs or hemoptysis or cp or chest tightness, subjective wheeze or overt sinus or hb symptoms.    Also denies any obvious fluctuation of symptoms with weather or environmental changes or other aggravating or alleviating factors except as outlined above   No unusual exposure hx or h/o childhood pna/ asthma or knowledge of premature birth.  Current Allergies, Complete Past Medical History, Past Surgical History, Family History, and Social History were reviewed in [employer] record.  ROS  The following are not active complaints unless bolded Hoarseness, sore throat, dysphagia, dental problems, itching, sneezing,  nasal congestion or discharge of excess mucus or purulent secretions, ear ache,   fever, chills, sweats, unintended wt loss or wt gain, classically pleuritic or exertional cp,  orthopnea pnd or arm/hand swelling  or leg swelling, presyncope, palpitations, abdominal pain, anorexia, nausea, vomiting, diarrhea  or change in bowel habits or change in bladder habits, change in stools or change in urine, dysuria, hematuria,  rash, arthralgias, visual complaints, headache, numbness, weakness or ataxia or problems with walking or coordination,  change in mood or  memory.        Current Meds  Medication Sig    acetaminophen  (TYLENOL ) 500 MG tablet Take 500 mg by mouth every 6 (six) hours as needed for moderate pain.   albuterol  (PROVENTIL ) (2.5 MG/3ML) 0.083% nebulizer solution Take 3 mLs (2.5 mg total) by nebulization every 6 (six) hours as needed for wheezing or shortness of breath.   albuterol  (VENTOLIN  HFA) 108 (90 Base) MCG/ACT inhaler Inhale 2 puffs into the lungs every 6 (six) hours as needed for wheezing or shortness of breath.   ALPRAZolam  (XANAX ) 0.25 MG tablet Take 0.125 mg by mouth daily.   Calcium  Carbonate Antacid (TUMS PO) Take 2-3 tablets by mouth daily.   Cyanocobalamin  (VITAMIN B-12) 2500 MCG SUBL Place 2,500 mcg under the tongue every morning.    diltiazem  (CARDIZEM  CD) 120 MG 24 hr capsule Take 1 capsule (120 mg total) by mouth daily.   ferrous sulfate  325 (65 FE) MG tablet Take 325 mg by mouth every evening.   folic acid  (FOLVITE ) 1 MG tablet TAKE 1 TABLET BY MOUTH EVERY DAY   furosemide  (LASIX ) 20 MG tablet Take 2 tablets (40 mg total) by mouth daily as needed for edema.  gabapentin  (NEURONTIN ) 300 MG capsule Take 300 mg by mouth at bedtime.   Glycerin-Hypromellose-PEG 400 (DRY EYE RELIEF DROPS OP) Apply 1 drop to eye daily as needed (dry eyes).   lidocaine -prilocaine  (EMLA ) cream Apply a small amount to port a cath site and cover with plastic wrap 1 hour prior to chemotherapy appointments   loteprednol  (LOTEMAX ) 0.5 % ophthalmic suspension Place 1 drop into both eyes 2 (two) times daily.   magnesium  oxide (MAG-OX) 400 MG tablet Take 1 tablet by mouth 2 (two) times daily.   midodrine  (PROAMATINE ) 10 MG tablet Take 1 tablet (10 mg total) by mouth 3 (three) times daily.   Multiple Vitamin (MULITIVITAMIN WITH MINERALS) TABS Take 1 tablet by mouth daily.   omeprazole (PRILOSEC) 20 MG capsule Take 20 mg by mouth daily.   XARELTO  20 MG TABS tablet TAKE 1 TABLET BY MOUTH DAILY WITH SUPPER.             Past Medical History:  Diagnosis Date   Anemia    Aortic stenosis    Arthritis     Cancer (HCC)    lung cancer 2019   Coronary artery calcification seen on CT scan    Dyspnea    Essential hypertension    GERD (gastroesophageal reflux disease)    History of lung cancer    Stage III adenocarcinoma status post chemoradiation   Port-A-Cath in place 01/06/2019   Type 2 diabetes mellitus (HCC)       Objective:    Wts   09/20/2023         175   09/11/23 177 lb 7.5 oz (80.5 kg)  09/03/23 193 lb (87.5 kg)  08/14/23 189 lb 13.1 oz (86.1 kg)      Vital signs reviewed  09/20/2023  - Note at rest 02 sats  92% on 4lpm poc   General appearance:    wc/ bound frail edlerly bf nad          HEENT : Oropharynx  clear      Nasal turbinates nl    NECK :  without  apparent JVD/ palpable Nodes/TM    LUNGS: no acc muscle use,  mildly kyphotic contour chest with decreased bs bases  bilaterally without cough on insp or exp maneuvers   CV:  RRR  no s3  3/6 SEM with slt increase in P2, and trace pitting edema both LEs  ABD:  soft and nontender   MS:   ext warm without deformities Or obvious joint restrictions  calf tenderness, cyanosis or clubbing    SKIN: warm and dry without lesions    NEURO:  alert, approp, nl sensorium with  no motor or cerebellar deficits apparent.       I personally reviewed images and agree with radiology impression as follows:   Chest CT 09/04/23    1. Unchanged post treatment appearance of the left lung, with extensive, fibrotic consolidation and bronchiectasis of the superior segment. 2. Slightly diminished volume of a small, loculated right pleural effusion, with a similar small left pleural effusion. Extensive pleural thickening bilaterally.     Assessment

## 2023-09-18 ENCOUNTER — Ambulatory Visit (HOSPITAL_COMMUNITY)
Admission: RE | Admit: 2023-09-18 | Discharge: 2023-09-18 | Disposition: A | Discharge status: Home / Self Care | Source: Ambulatory Visit | Attending: Hematology | Admitting: Hematology

## 2023-09-18 DIAGNOSIS — C787 Secondary malignant neoplasm of liver and intrahepatic bile duct: Secondary | ICD-10-CM | POA: Diagnosis not present

## 2023-09-18 DIAGNOSIS — K769 Liver disease, unspecified: Secondary | ICD-10-CM | POA: Diagnosis not present

## 2023-09-18 DIAGNOSIS — C3490 Malignant neoplasm of unspecified part of unspecified bronchus or lung: Secondary | ICD-10-CM | POA: Diagnosis not present

## 2023-09-18 DIAGNOSIS — K7689 Other specified diseases of liver: Secondary | ICD-10-CM | POA: Diagnosis not present

## 2023-09-18 DIAGNOSIS — Z9049 Acquired absence of other specified parts of digestive tract: Secondary | ICD-10-CM | POA: Diagnosis not present

## 2023-09-18 MED ORDER — GADOBUTROL 1 MMOL/ML IV SOLN
7.5000 mL | Freq: Once | INTRAVENOUS | Status: AC | PRN
  Administered 2023-09-18: 7.5 mL via INTRAVENOUS

## 2023-09-20 ENCOUNTER — Encounter: Payer: Self-pay | Admitting: Internal Medicine

## 2023-09-20 ENCOUNTER — Ambulatory Visit: Admitting: Internal Medicine

## 2023-09-20 VITALS — BP 113/60 | HR 132 | Ht 62.0 in | Wt 175.2 lb

## 2023-09-20 DIAGNOSIS — J9612 Chronic respiratory failure with hypercapnia: Secondary | ICD-10-CM

## 2023-09-20 DIAGNOSIS — J9611 Chronic respiratory failure with hypoxia: Secondary | ICD-10-CM | POA: Diagnosis not present

## 2023-09-20 NOTE — Assessment & Plan Note (Addendum)
 HC03   07/23/23  =   34   As of 09/20/2023 rx = 3lpm hs and up to 4lpm daytime .  Well compensated at present with really nothing else to offer than palliative rx from a pulmonary perspective so f/u will be prn specific pulmonary concerns         Each maintenance medication was reviewed in detail including emphasizing most importantly the difference between maintenance and prns and under what circumstances the prns are to be triggered using an action plan format where appropriate.  Total time for H and P, chart review, counseling, reviewing hfa/ neb/ 02 /pulse ox  device(s) and generating customized AVS unique to this office visit / same day charting = 20 min summary final regular pulmonary f/u

## 2023-09-20 NOTE — Patient Instructions (Signed)
 Adjust 02 to keep saturations above 90% at all time - you will need more when you are active and less when you are still.   Pulmonary follow up is as needed

## 2023-09-24 DIAGNOSIS — H04123 Dry eye syndrome of bilateral lacrimal glands: Secondary | ICD-10-CM | POA: Diagnosis not present

## 2023-09-25 ENCOUNTER — Inpatient Hospital Stay

## 2023-09-25 ENCOUNTER — Emergency Department (HOSPITAL_COMMUNITY)

## 2023-09-25 ENCOUNTER — Inpatient Hospital Stay: Attending: Neurosurgery

## 2023-09-25 ENCOUNTER — Inpatient Hospital Stay (HOSPITAL_BASED_OUTPATIENT_CLINIC_OR_DEPARTMENT_OTHER): Admitting: Hematology

## 2023-09-25 ENCOUNTER — Encounter (HOSPITAL_COMMUNITY): Payer: Self-pay | Admitting: Emergency Medicine

## 2023-09-25 ENCOUNTER — Other Ambulatory Visit: Payer: Self-pay

## 2023-09-25 ENCOUNTER — Inpatient Hospital Stay (HOSPITAL_COMMUNITY)
Admission: EM | Admit: 2023-09-25 | Discharge: 2023-10-19 | DRG: 189 | Disposition: E | Attending: Internal Medicine | Admitting: Internal Medicine

## 2023-09-25 VITALS — BP 116/63 | HR 117 | Temp 98.5°F | Resp 20

## 2023-09-25 DIAGNOSIS — Z96641 Presence of right artificial hip joint: Secondary | ICD-10-CM | POA: Diagnosis not present

## 2023-09-25 DIAGNOSIS — I2510 Atherosclerotic heart disease of native coronary artery without angina pectoris: Secondary | ICD-10-CM | POA: Diagnosis not present

## 2023-09-25 DIAGNOSIS — R55 Syncope and collapse: Secondary | ICD-10-CM | POA: Diagnosis not present

## 2023-09-25 DIAGNOSIS — I10 Essential (primary) hypertension: Secondary | ICD-10-CM | POA: Diagnosis present

## 2023-09-25 DIAGNOSIS — K219 Gastro-esophageal reflux disease without esophagitis: Secondary | ICD-10-CM | POA: Diagnosis present

## 2023-09-25 DIAGNOSIS — Z9049 Acquired absence of other specified parts of digestive tract: Secondary | ICD-10-CM | POA: Diagnosis not present

## 2023-09-25 DIAGNOSIS — C3492 Malignant neoplasm of unspecified part of left bronchus or lung: Secondary | ICD-10-CM

## 2023-09-25 DIAGNOSIS — I2699 Other pulmonary embolism without acute cor pulmonale: Secondary | ICD-10-CM | POA: Diagnosis present

## 2023-09-25 DIAGNOSIS — Z79899 Other long term (current) drug therapy: Secondary | ICD-10-CM

## 2023-09-25 DIAGNOSIS — K5730 Diverticulosis of large intestine without perforation or abscess without bleeding: Secondary | ICD-10-CM | POA: Diagnosis not present

## 2023-09-25 DIAGNOSIS — Z833 Family history of diabetes mellitus: Secondary | ICD-10-CM

## 2023-09-25 DIAGNOSIS — C7931 Secondary malignant neoplasm of brain: Secondary | ICD-10-CM | POA: Diagnosis not present

## 2023-09-25 DIAGNOSIS — G9349 Other encephalopathy: Secondary | ICD-10-CM | POA: Diagnosis not present

## 2023-09-25 DIAGNOSIS — I9589 Other hypotension: Secondary | ICD-10-CM | POA: Diagnosis present

## 2023-09-25 DIAGNOSIS — J9621 Acute and chronic respiratory failure with hypoxia: Secondary | ICD-10-CM | POA: Diagnosis present

## 2023-09-25 DIAGNOSIS — I2609 Other pulmonary embolism with acute cor pulmonale: Secondary | ICD-10-CM

## 2023-09-25 DIAGNOSIS — Z96612 Presence of left artificial shoulder joint: Secondary | ICD-10-CM | POA: Diagnosis not present

## 2023-09-25 DIAGNOSIS — C3490 Malignant neoplasm of unspecified part of unspecified bronchus or lung: Secondary | ICD-10-CM | POA: Diagnosis not present

## 2023-09-25 DIAGNOSIS — Z85118 Personal history of other malignant neoplasm of bronchus and lung: Secondary | ICD-10-CM

## 2023-09-25 DIAGNOSIS — R0689 Other abnormalities of breathing: Secondary | ICD-10-CM

## 2023-09-25 DIAGNOSIS — R4182 Altered mental status, unspecified: Secondary | ICD-10-CM | POA: Diagnosis not present

## 2023-09-25 DIAGNOSIS — Z8261 Family history of arthritis: Secondary | ICD-10-CM

## 2023-09-25 DIAGNOSIS — J90 Pleural effusion, not elsewhere classified: Secondary | ICD-10-CM | POA: Diagnosis not present

## 2023-09-25 DIAGNOSIS — E119 Type 2 diabetes mellitus without complications: Secondary | ICD-10-CM | POA: Diagnosis not present

## 2023-09-25 DIAGNOSIS — Z803 Family history of malignant neoplasm of breast: Secondary | ICD-10-CM

## 2023-09-25 DIAGNOSIS — G9340 Encephalopathy, unspecified: Secondary | ICD-10-CM | POA: Diagnosis present

## 2023-09-25 DIAGNOSIS — R404 Transient alteration of awareness: Secondary | ICD-10-CM | POA: Diagnosis not present

## 2023-09-25 DIAGNOSIS — R410 Disorientation, unspecified: Secondary | ICD-10-CM | POA: Diagnosis not present

## 2023-09-25 DIAGNOSIS — Z808 Family history of malignant neoplasm of other organs or systems: Secondary | ICD-10-CM

## 2023-09-25 DIAGNOSIS — Z7901 Long term (current) use of anticoagulants: Secondary | ICD-10-CM

## 2023-09-25 DIAGNOSIS — E8729 Other acidosis: Secondary | ICD-10-CM | POA: Diagnosis not present

## 2023-09-25 DIAGNOSIS — R0902 Hypoxemia: Secondary | ICD-10-CM | POA: Diagnosis not present

## 2023-09-25 DIAGNOSIS — Z90710 Acquired absence of both cervix and uterus: Secondary | ICD-10-CM

## 2023-09-25 DIAGNOSIS — J9622 Acute and chronic respiratory failure with hypercapnia: Principal | ICD-10-CM | POA: Diagnosis present

## 2023-09-25 DIAGNOSIS — Z95828 Presence of other vascular implants and grafts: Secondary | ICD-10-CM

## 2023-09-25 DIAGNOSIS — I480 Paroxysmal atrial fibrillation: Secondary | ICD-10-CM | POA: Diagnosis present

## 2023-09-25 DIAGNOSIS — Z8249 Family history of ischemic heart disease and other diseases of the circulatory system: Secondary | ICD-10-CM

## 2023-09-25 DIAGNOSIS — C787 Secondary malignant neoplasm of liver and intrahepatic bile duct: Secondary | ICD-10-CM | POA: Diagnosis present

## 2023-09-25 DIAGNOSIS — Z87891 Personal history of nicotine dependence: Secondary | ICD-10-CM

## 2023-09-25 DIAGNOSIS — Z9884 Bariatric surgery status: Secondary | ICD-10-CM

## 2023-09-25 DIAGNOSIS — R000 Tachycardia, unspecified: Secondary | ICD-10-CM | POA: Diagnosis not present

## 2023-09-25 DIAGNOSIS — Z515 Encounter for palliative care: Secondary | ICD-10-CM | POA: Diagnosis not present

## 2023-09-25 DIAGNOSIS — Z825 Family history of asthma and other chronic lower respiratory diseases: Secondary | ICD-10-CM | POA: Diagnosis not present

## 2023-09-25 DIAGNOSIS — Z923 Personal history of irradiation: Secondary | ICD-10-CM

## 2023-09-25 DIAGNOSIS — Z9221 Personal history of antineoplastic chemotherapy: Secondary | ICD-10-CM

## 2023-09-25 DIAGNOSIS — Z66 Do not resuscitate: Secondary | ICD-10-CM | POA: Diagnosis present

## 2023-09-25 DIAGNOSIS — R110 Nausea: Secondary | ICD-10-CM

## 2023-09-25 DIAGNOSIS — Z86711 Personal history of pulmonary embolism: Secondary | ICD-10-CM

## 2023-09-25 DIAGNOSIS — R918 Other nonspecific abnormal finding of lung field: Secondary | ICD-10-CM | POA: Diagnosis not present

## 2023-09-25 DIAGNOSIS — I700 Atherosclerosis of aorta: Secondary | ICD-10-CM | POA: Diagnosis not present

## 2023-09-25 LAB — COMPREHENSIVE METABOLIC PANEL WITH GFR
ALT: 9 U/L (ref 0–44)
ALT: 10 U/L (ref 0–44)
AST: 15 U/L (ref 15–41)
AST: 21 U/L (ref 15–41)
Albumin: 2.9 g/dL — ABNORMAL LOW (ref 3.5–5.0)
Albumin: 3.2 g/dL — ABNORMAL LOW (ref 3.5–5.0)
Alkaline Phosphatase: 83 U/L (ref 38–126)
Alkaline Phosphatase: 86 U/L (ref 38–126)
Anion gap: 10 (ref 5–15)
Anion gap: 10 (ref 5–15)
BUN: 7 mg/dL — ABNORMAL LOW (ref 8–23)
BUN: 8 mg/dL (ref 8–23)
CO2: 38 mmol/L — ABNORMAL HIGH (ref 22–32)
CO2: 38 mmol/L — ABNORMAL HIGH (ref 22–32)
Calcium: 9 mg/dL (ref 8.9–10.3)
Calcium: 9.3 mg/dL (ref 8.9–10.3)
Chloride: 90 mmol/L — ABNORMAL LOW (ref 98–111)
Chloride: 92 mmol/L — ABNORMAL LOW (ref 98–111)
Creatinine, Ser: 0.77 mg/dL (ref 0.44–1.00)
Creatinine, Ser: 0.94 mg/dL (ref 0.44–1.00)
GFR, Estimated: >60 mL/min (ref >60)
GFR, Estimated: >60 mL/min (ref >60)
Glucose, Bld: 127 mg/dL — ABNORMAL HIGH (ref 70–99)
Glucose, Bld: 135 mg/dL — ABNORMAL HIGH (ref 70–99)
Potassium: 3.7 mmol/L (ref 3.5–5.1)
Potassium: 4.4 mmol/L (ref 3.5–5.1)
Sodium: 138 mmol/L (ref 135–145)
Sodium: 140 mmol/L (ref 135–145)
Total Bilirubin: 0.4 mg/dL (ref 0.0–1.2)
Total Bilirubin: 0.5 mg/dL (ref 0.0–1.2)
Total Protein: 6.6 g/dL (ref 6.5–8.1)
Total Protein: 7.1 g/dL (ref 6.5–8.1)

## 2023-09-25 LAB — CBC WITH DIFFERENTIAL/PLATELET
Abs Immature Granulocytes: 0.01 K/uL (ref 0.00–0.07)
Basophils Absolute: 0 K/uL (ref 0.0–0.1)
Basophils Relative: 1 %
Eosinophils Absolute: 0 K/uL (ref 0.0–0.5)
Eosinophils Relative: 0 %
HCT: 36.1 % (ref 36.0–46.0)
Hemoglobin: 10.2 g/dL — ABNORMAL LOW (ref 12.0–15.0)
Immature Granulocytes: 0 %
Lymphocytes Relative: 9 %
Lymphs Abs: 0.5 K/uL — ABNORMAL LOW (ref 0.7–4.0)
MCH: 27.1 pg (ref 26.0–34.0)
MCHC: 28.3 g/dL — ABNORMAL LOW (ref 30.0–36.0)
MCV: 95.8 fL (ref 80.0–100.0)
Monocytes Absolute: 0.3 K/uL (ref 0.1–1.0)
Monocytes Relative: 6 %
Neutro Abs: 4.5 K/uL (ref 1.7–7.7)
Neutrophils Relative %: 84 %
Platelets: 277 K/uL (ref 150–400)
RBC: 3.77 MIL/uL — ABNORMAL LOW (ref 3.87–5.11)
RDW: 14.9 % (ref 11.5–15.5)
WBC: 5.5 K/uL (ref 4.0–10.5)
nRBC: 0 % (ref 0.0–0.2)

## 2023-09-25 LAB — CBC
HCT: 41.3 % (ref 36.0–46.0)
Hemoglobin: 11.4 g/dL — ABNORMAL LOW (ref 12.0–15.0)
MCH: 28.1 pg (ref 26.0–34.0)
MCHC: 27.6 g/dL — ABNORMAL LOW (ref 30.0–36.0)
MCV: 101.7 fL — ABNORMAL HIGH (ref 80.0–100.0)
Platelets: 246 K/uL (ref 150–400)
RBC: 4.06 MIL/uL (ref 3.87–5.11)
RDW: 14.7 % (ref 11.5–15.5)
WBC: 6.5 K/uL (ref 4.0–10.5)
nRBC: 0 % (ref 0.0–0.2)

## 2023-09-25 LAB — MAGNESIUM: Magnesium: 1.9 mg/dL (ref 1.7–2.4)

## 2023-09-25 LAB — LACTIC ACID, PLASMA
Lactic Acid, Venous: 0.9 mmol/L (ref 0.5–1.9)
Lactic Acid, Venous: 0.4 mmol/L — ABNORMAL LOW (ref 0.5–1.9)

## 2023-09-25 LAB — AMMONIA: Ammonia: 33 umol/L (ref 9–35)

## 2023-09-25 LAB — CBG MONITORING, ED: Glucose-Capillary: 140 mg/dL — ABNORMAL HIGH (ref 70–99)

## 2023-09-25 MED ORDER — LORAZEPAM 2 MG/ML IJ SOLN
0.5000 mg | Freq: Once | INTRAMUSCULAR | Status: AC
  Administered 2023-09-25: 0.5 mg via INTRAVENOUS
  Filled 2023-09-25: qty 1

## 2023-09-25 MED ORDER — IOHEXOL 300 MG/ML  SOLN: 100.0000 mL | Freq: Once | INTRAMUSCULAR | Status: DC | PRN

## 2023-09-25 MED ORDER — ONDANSETRON HCL 4 MG/2ML IJ SOLN
4.0000 mg | Freq: Once | INTRAMUSCULAR | Status: AC
  Administered 2023-09-25: 4 mg via INTRAVENOUS
  Filled 2023-09-25: qty 2

## 2023-09-25 MED ORDER — ALBUTEROL SULFATE (2.5 MG/3ML) 0.083% IN NEBU
INHALATION_SOLUTION | RESPIRATORY_TRACT | Status: AC
  Administered 2023-09-25: 10 mg
  Filled 2023-09-25: qty 12

## 2023-09-25 MED ORDER — SODIUM CHLORIDE 0.9% FLUSH
10.0000 mL | INTRAVENOUS | Status: DC | PRN
  Administered 2023-09-25: 10 mL via INTRAVENOUS

## 2023-09-25 MED ORDER — ONDANSETRON HCL 4 MG PO TABS: 4.0000 mg | ORAL_TABLET | Freq: Three times a day (TID) | ORAL | 2 refills | Status: DC | PRN

## 2023-09-25 MED ORDER — IOHEXOL 300 MG/ML  SOLN
75.0000 mL | Freq: Once | INTRAMUSCULAR | Status: AC | PRN
Start: 2023-09-25 — End: 2023-09-25
  Administered 2023-09-25: 75 mL via INTRAVENOUS

## 2023-09-25 MED ORDER — HEPARIN SOD (PORK) LOCK FLUSH 100 UNIT/ML IV SOLN
500.0000 [IU] | Freq: Once | INTRAVENOUS | Status: AC
  Administered 2023-09-25: 500 [IU] via INTRAVENOUS

## 2023-09-25 MED ORDER — SODIUM CHLORIDE 0.9% FLUSH
10.0000 mL | INTRAVENOUS | Status: AC
  Administered 2023-09-25: 10 mL

## 2023-09-25 MED ORDER — IPRATROPIUM BROMIDE 0.02 % IN SOLN
RESPIRATORY_TRACT | Status: AC
  Administered 2023-09-25: 1 mg
  Filled 2023-09-25: qty 5

## 2023-09-25 MED ORDER — LACTATED RINGERS IV BOLUS
1000.0000 mL | Freq: Once | INTRAVENOUS | Status: AC
  Administered 2023-09-25: 1000 mL via INTRAVENOUS

## 2023-09-25 NOTE — ED Triage Notes (Signed)
 Pt bib EMS after family was notified by home health nurse that pt was altered from baseline. CBG was 119 per EMS. Pt with hx of lung and liver ca and new spots on brain per EMS according to family and treatment has been discontinued. Pt lethargic during triage but opens eyes to name.

## 2023-09-25 NOTE — ED Notes (Signed)
 ED Provider at bedside.

## 2023-09-25 NOTE — ED Provider Notes (Signed)
 [facility] AT [facility] Provider Note  CSN: [id number] Arrival date & time: 09/25/23 2011  Chief Complaint(s) Altered Mental Status  HPI Denise [name] is a 73 y.o. female with PMH small cell lung cancer with mets to the liver and brain, GERD, T2DM who presents emergency room for evaluation of altered mental status.  History obtained from patient's family who states that the patient had normal mental status during her oncology appointment today.  He they ultimately decided not to pursue additional chemotherapy given decreased functional status and patient left that appointment, did some errands and then went home.  Patient was by herself for a few hours and family then found her somnolent and altered.  Patient arrives altered and excessively somnolent unable to answer questions.  Arrives saturating 92% on her home 4 L.  History unable to be obtained due to patient's altered mental status.   Past Medical History Past Medical History:  Diagnosis Date   Anemia    Aortic stenosis    Arthritis    Cancer (HCC)    lung cancer 2019   Coronary artery calcification seen on CT scan    Dyspnea    Essential hypertension    GERD (gastroesophageal reflux disease)    History of lung cancer    Stage III adenocarcinoma status post chemoradiation   Port-A-Cath in place 01/06/2019   Type 2 diabetes mellitus [facility])    Patient Active Problem List   Diagnosis Date Noted   Acute respiratory failure with hypercapnia (HCC) 22-Oct-2023   Chronic respiratory failure with hypoxia and hypercapnia (HCC) 06/24/2023   Hypotension 06/24/2023   Acute on chronic hypoxic respiratory failure (HCC) 06/13/2023   Colitis 01/11/2023   Pneumonia of left lower lobe due to infectious organism 12/28/2022   Acute respiratory failure with hypoxia (HCC) 12/28/2022   Loculated pleural effusion 12/28/2022   Acute on chronic respiratory failure with hypoxia (HCC) 12/24/2022   Pleural effusion, left  12/24/2022   Multifocal pneumonia 12/24/2022   Atrial fibrillation with RVR (HCC) 12/24/2022   Severe sepsis (HCC) 12/24/2022   Iron deficiency anemia 09/02/2022   Early satiety 09/02/2022   Osteoarthritis of left knee 10/16/2021   Osteoarthritis of right knee 09/29/2021   Intramuscular hematoma 08/03/2021   Pulmonary embolus (HCC) 07/17/2021   Type 2 diabetes mellitus (HCC)    Essential hypertension    History of lung cancer    Class 3 obesity    Hypokalemia    Malignant neoplasm metastatic to brain (HCC) 06/04/2020   Meningioma, cerebral (HCC) 05/18/2020   Abnormal myocardial perfusion study    Aortic stenosis    DOE (dyspnea on exertion) 10/09/2019   Meningioma (HCC) 03/20/2019   Port-A-Cath in place 01/06/2019   Adenocarcinoma of left lung (HCC) 12/09/2018   S/P shoulder replacement 01/21/2015   Expected blood loss anemia 03/08/2012   Morbid obesity with BMI of 45.0-49.9, adult (HCC) 03/08/2012   S/P right THA, AA 03/07/2012   Small bowel obstruction (HCC) 04/11/2011   Incisional hernia with obstruction 04/11/2011   Pelvic mass 03/29/2011   Radicular low back pain 11/03/2010   Hip arthritis 11/03/2010   Home Medication(s) Prior to Admission medications   Medication Sig Start Date End Date Taking? Authorizing Provider  acetaminophen  (TYLENOL ) 500 MG tablet Take 500 mg by mouth every 6 (six) hours as needed for moderate pain.   Yes [provider]  albuterol  (PROVENTIL ) (2.5 MG/3ML) 0.083% nebulizer solution Take 3 mLs (2.5 mg total) by nebulization every 6 (  six) hours as needed for wheezing or shortness of breath. 06/07/23  Yes Rogers Hai, MD  albuterol  (VENTOLIN  HFA) 108 [phone] Base) MCG/ACT inhaler Inhale 2 puffs into the lungs every 6 (six) hours as needed for wheezing or shortness of breath. 01/02/23  Yes Ghimire, Donalda HERO, MD  ALPRAZolam  (XANAX ) 0.25 MG tablet Take 0.125 mg by mouth daily. 06/29/23  Yes [provider]  Calcium  Carbonate Antacid  (TUMS PO) Take 2-3 tablets by mouth daily.   Yes [provider]  Cyanocobalamin  (VITAMIN B-12) 2500 MCG SUBL Place 2,500 mcg under the tongue every morning.    Yes [provider]  diltiazem  (CARDIZEM  CD) 120 MG 24 hr capsule Take 1 capsule (120 mg total) by mouth daily. 08/21/23  Yes Debera Jayson MATSU, MD  ferrous sulfate  325 (65 FE) MG tablet Take 325 mg by mouth every evening.   Yes [provider]  folic acid  (FOLVITE ) 1 MG tablet TAKE 1 TABLET BY MOUTH EVERY DAY 03/19/23  Yes Rogers Hai, MD  furosemide  (LASIX ) 20 MG tablet Take 2 tablets (40 mg total) by mouth daily as needed for edema. 09/03/23  Yes Debera Jayson MATSU, MD  gabapentin  (NEURONTIN ) 300 MG capsule Take 300 mg by mouth at bedtime. 07/26/23  Yes [provider]  Glycerin-Hypromellose-PEG 400 (DRY EYE RELIEF DROPS OP) Apply 1 drop to eye daily as needed (dry eyes).   Yes [provider]  lidocaine -prilocaine  (EMLA ) cream Apply a small amount to port a cath site and cover with plastic wrap 1 hour prior to chemotherapy appointments 07/02/23  Yes Rogers Hai, MD  loteprednol  (LOTEMAX ) 0.5 % ophthalmic suspension Place 1 drop into both eyes 2 (two) times daily. 05/07/23  Yes [provider]  magnesium  oxide (MAG-OX) 400 MG tablet Take 1 tablet by mouth 2 (two) times daily. 05/22/23  Yes [provider]  midodrine  (PROAMATINE ) 10 MG tablet Take 1 tablet (10 mg total) by mouth 3 (three) times daily. 09/03/23  Yes Debera Jayson MATSU, MD  Multiple Vitamin (MULITIVITAMIN WITH MINERALS) TABS Take 1 tablet by mouth daily.   Yes [provider]  omeprazole (PRILOSEC) 20 MG capsule Take 20 mg by mouth daily. 11/18/22  Yes [provider]  ondansetron  (ZOFRAN ) 4 MG tablet Take 1 tablet (4 mg total) by mouth every 8 (eight) hours as needed for nausea or vomiting. 09/25/23  Yes Rogers Hai, MD  XARELTO  20 MG TABS tablet TAKE 1 TABLET BY MOUTH DAILY WITH  SUPPER. 09/17/23  Yes Rogers Hai, MD                                                                                                                                    Past Surgical History Past Surgical History:  Procedure Laterality Date   BIOPSY  09/25/2022   Procedure: BIOPSY;  Surgeon: Cindie Carlin POUR, DO;  Location: [facility];  Service: Endoscopy;;   CHOLECYSTECTOMY  1997   COLONOSCOPY     COLONOSCOPY WITH PROPOFOL  N/A 09/25/2022   Procedure: COLONOSCOPY WITH PROPOFOL ;  Surgeon: Cindie Carlin POUR, DO;  Location: [facility];  Service: Endoscopy;  Laterality: N/A;  11:00;ASA 3   ESOPHAGEAL BRUSHING  09/25/2022   Procedure: ESOPHAGEAL BRUSHING;  Surgeon: Cindie Carlin POUR, DO;  Location: [facility];  Service: Endoscopy;;   ESOPHAGOGASTRODUODENOSCOPY (EGD) WITH PROPOFOL  N/A 09/25/2022   Procedure: ESOPHAGOGASTRODUODENOSCOPY (EGD) WITH PROPOFOL ;  Surgeon: Cindie Carlin POUR, DO;  Location: [facility];  Service: Endoscopy;  Laterality: N/A;  11:00AM;ASA 3   GASTRIC BYPASS     INCISIONAL HERNIA REPAIR  04/11/11   IR IMAGING GUIDED PORT INSERTION  12/27/2018   Right   IR US  GUIDE BX ASP/DRAIN  08/08/2021   LAPAROSCOPIC SALPINGOOPHERECTOMY     LAPAROTOMY  04/11/2011   Procedure: EXPLORATORY LAPAROTOMY;  Surgeon: Debby LABOR. Cornett, MD;  Location: [facility];  Service: General;  Laterality: N/A;  closure port hole   POLYPECTOMY  09/25/2022   Procedure: POLYPECTOMY;  Surgeon: Cindie Carlin POUR, DO;  Location: [facility];  Service: Endoscopy;;   RIGHT/LEFT HEART CATH AND CORONARY ANGIOGRAPHY N/A 12/29/2019   Procedure: RIGHT/LEFT HEART CATH AND CORONARY ANGIOGRAPHY;  Surgeon: Burnard Debby LABOR, MD;  Location: [facility];  Service: Cardiovascular;  Laterality: N/A;   TOTAL HIP ARTHROPLASTY  03/07/2012   Procedure: TOTAL HIP ARTHROPLASTY ANTERIOR APPROACH;  Surgeon: Donnice JONETTA Car, MD;  Location: [facility];  Service: Orthopedics;  Laterality: Right;   TOTAL SHOULDER ARTHROPLASTY Left  01/21/2015   TOTAL SHOULDER ARTHROPLASTY Left 01/21/2015   Procedure: LEFT TOTAL SHOULDER ARTHROPLASTY;  Surgeon: Franky Pointer, MD;  Location: [facility];  Service: Orthopedics;  Laterality: Left;   VAGINAL HYSTERECTOMY     Family History Family History  Problem Relation Age of Onset   Breast cancer Mother    COPD Mother    Arthritis Mother    Diabetes Mother    Hypertension Mother    Hypertension Father    Diabetes Father    Breast cancer Sister    Thyroid  cancer Brother    Heart attack Brother    Huntington's disease Maternal Grandmother     Social History Social History   Tobacco Use   Smoking status: Former    Current packs/day: 0.00    Average packs/day: 1 pack/day for 12.0 years (12.0 ttl pk-yrs)    Types: Cigarettes    Start date: 03/20/1964    Quit date: 03/20/1976    Years since quitting: 47.5    Passive exposure: Past   Smokeless tobacco: Never  Vaping Use   Vaping status: Never Used  Substance Use Topics   Alcohol  use: No   Drug use: No   Allergies Patient has no known allergies.  Review of Systems Review of Systems  Unable to perform ROS: Mental status change    Physical Exam Vital Signs  I have reviewed the triage vital signs BP 102/66 (BP Location: Left Wrist)   Pulse (!) 117   Temp 98 F (36.7 C) (Axillary)   Resp 15   Ht 5' 2 (1.575 m)   Wt 79 kg   SpO2 97%   BMI 31.85 kg/m   Physical Exam Vitals and nursing note reviewed.  Constitutional:      General: She is not in acute distress.    Appearance: She is well-developed. She is ill-appearing.  HENT:     Head: Normocephalic and atraumatic.  Eyes:  Conjunctiva/sclera: Conjunctivae normal.  Cardiovascular:     Rate and Rhythm: Normal rate and regular rhythm.     Heart sounds: No murmur heard. Pulmonary:     Effort: Pulmonary effort is normal. No respiratory distress.     Breath sounds: Wheezing present.  Abdominal:     Palpations: Abdomen is soft.     Tenderness: There is no  abdominal tenderness.  Musculoskeletal:        General: No swelling.     Cervical back: Neck supple.  Skin:    General: Skin is warm and dry.     Capillary Refill: Capillary refill takes less than 2 seconds.  Neurological:     Mental Status: She is alert.  Psychiatric:        Mood and Affect: Mood normal.     ED Results and Treatments Labs (all labs ordered are listed, but only abnormal results are displayed) Labs Reviewed  COMPREHENSIVE METABOLIC PANEL WITH GFR - Abnormal; Notable for the following components:      Result Value   Chloride 92 (*)    CO2 38 (*)    Glucose, Bld 135 (*)    Albumin 3.2 (*)    All other components within normal limits  CBC - Abnormal; Notable for the following components:   Hemoglobin 11.4 (*)    MCV 101.7 (*)    MCHC 27.6 (*)    All other components within normal limits  LACTIC ACID, PLASMA - Abnormal; Notable for the following components:   Lactic Acid, Venous 0.4 (*)    All other components within normal limits  BLOOD GAS, VENOUS - Abnormal; Notable for the following components:   pH, Ven 7.19 (*)    pCO2, Ven 122 (*)    pO2, Ven 64 (*)    Bicarbonate 47.0 (*)    Acid-Base Excess 13.4 (*)    All other components within normal limits  CBG MONITORING, ED - Abnormal; Notable for the following components:   Glucose-Capillary 140 (*)    All other components within normal limits  MRSA NEXT GEN BY PCR, NASAL  AMMONIA  LACTIC ACID, PLASMA  URINALYSIS, ROUTINE W REFLEX MICROSCOPIC  RAPID URINE DRUG SCREEN, HOSP PERFORMED                                                                                                                          Radiology CT Head W or Wo Contrast Result Date: 09/25/2023 CLINICAL DATA:  Delirium hx brain mets EXAM: CT HEAD WITHOUT AND WITH CONTRAST TECHNIQUE: Contiguous axial images were obtained from the base of the skull through the vertex without and with intravenous contrast. RADIATION DOSE REDUCTION: This exam  was performed according to the departmental dose-optimization program which includes automated exposure control, adjustment of the mA and/or kV according to patient size and/or use of iterative reconstruction technique. CONTRAST:  75mL OMNIPAQUE  IOHEXOL  300 MG/ML  SOLN COMPARISON:  MRI head 10/02/2022 FINDINGS: Brain: No evidence of large-territorial acute infarction.  No parenchymal hemorrhage. Redemonstration of a grossly stable to possibly minimally increased in size left sphenoid wing 2.8 x 1.5 cm mass consistent with known meningioma. Otherwise no enhancing mass lesion. No extra-axial collection. No mass effect or midline shift. No hydrocephalus. Basilar cisterns are patent. Vascular: No hyperdense vessel. Skull: No acute fracture or focal lesion. Sinuses/Orbits: Paranasal sinuses and mastoid air cells are clear. The orbits are unremarkable. Other: None. IMPRESSION: 1. No acute intracranial abnormality. Please note limited evaluation for metastases even on the CT head with contrast. Recommend MRI with and without contrast for further evaluation if concern for metastases. 2. Grossly stable to possibly minimally increased in size left sphenoid wing meningioma. Electronically Signed   By: Morgane  Naveau M.D.   On: 09/25/2023 23:09   CT CHEST ABDOMEN PELVIS W CONTRAST Result Date: 09/25/2023 CLINICAL DATA:  Sepsis EXAM: CT CHEST, ABDOMEN, AND PELVIS WITH CONTRAST TECHNIQUE: Multidetector CT imaging of the chest, abdomen and pelvis was performed following the standard protocol during bolus administration of intravenous contrast. RADIATION DOSE REDUCTION: This exam was performed according to the departmental dose-optimization program which includes automated exposure control, adjustment of the mA and/or kV according to patient size and/or use of iterative reconstruction technique. CONTRAST:  75mL OMNIPAQUE  IOHEXOL  300 MG/ML SOLN, <See Chart> OMNIPAQUE  IOHEXOL  300 MG/ML SOLN COMPARISON:  09/04/2023 FINDINGS: CT  CHEST FINDINGS Cardiovascular: Cardiomegaly. Aortic valve and mitral valve calcifications. Scattered coronary artery calcifications. No evidence of aortic aneurysm. No central pulmonary embolus. Small pericardial effusion. Mediastinum/Nodes: No mediastinal, hilar, or axillary adenopathy. Trachea and esophagus are unremarkable. Thyroid  unremarkable. Lungs/Pleura: Small loculated right pleural effusion is again noted. Trace left pleural effusion. These are stable since prior study. Post treatment changes again noted in the left lung, unchanged since prior study. Bibasilar atelectasis or scarring, stable. Ground-glass/mosaic attenuation within both lungs, stable. Musculoskeletal: Chest wall soft tissues are unremarkable. Right chest wall Port-A-Cath remains in place, unchanged. No acute bony abnormality. CT ABDOMEN PELVIS FINDINGS Hepatobiliary: Mixed density mass in the right hepatic lobe is again noted with central low-density and surrounding enhancing rim. This is not significantly changed since prior study. Prior cholecystectomy. Slight associated biliary ductal dilatation, unchanged. Pancreas: No focal abnormality or ductal dilatation. Spleen: No focal abnormality.  Normal size. Adrenals/Urinary Tract: No adrenal abnormality. No focal renal abnormality. No stones or hydronephrosis. Urinary bladder is unremarkable. Stomach/Bowel: Diffuse colonic diverticulosis. No active diverticulitis. Umbilical hernia containing multiple small bowel loops. No bowel obstruction. Postoperative changes from gastric bypass. No complicating feature. Vascular/Lymphatic: Aortic atherosclerosis. No evidence of aneurysm or adenopathy. Reproductive: Calcified uterine fibroid again noted. No adnexal mass. Other: No free fluid or free air. Musculoskeletal: Prior right hip replacement. No acute bony abnormality. IMPRESSION: Stable chronic post treatment changes within the left lung. Stable mosaic attenuation within both lungs and bibasilar  atelectasis or scarring. Cardiomegaly, aortic atherosclerosis. Stable right hepatic lesion/metastasis. Umbilical hernia containing small bowel loops. No bowel obstruction. Diffuse colonic diverticulosis.  No active diverticulitis. Stable loculated right pleural effusion and small left pleural effusion. Electronically Signed   By: Franky Crease M.D.   On: 09/25/2023 23:09   DG Chest Portable 1 View Result Date: 09/25/2023 CLINICAL DATA:  hypoxia EXAM: PORTABLE CHEST 1 VIEW COMPARISON:  CT chest 03/05/2024, chest x-ray 06/27/2023 FINDINGS: Right chest wall Port-A-Cath with tip overlying the superior cavoatrial junction. The heart and mediastinal contours are unchanged. Atherosclerotic plaque. Bilateral patchy airspace opacities. No pulmonary edema. Bilateral trace to small volume bilateral pleural effusions. No pneumothorax. No acute osseous abnormality.  Right shoulder degenerative changes. IMPRESSION: 1. Bilateral patchy airspace opacities with trace to small volume bilateral pleural effusions. 2.  Aortic Atherosclerosis (ICD10-I70.0). Electronically Signed   By: Morgane  Naveau M.D.   On: 09/25/2023 21:35    Pertinent labs & imaging results that were available during my care of the patient were reviewed by me and considered in my medical decision making (see MDM for details).  Medications Ordered in ED Medications  iohexol  (OMNIPAQUE ) 300 MG/ML solution 100 mL (100 mLs Intravenous Incomplete 09/25/23 2235)  Chlorhexidine  Gluconate Cloth 2 % PADS 6 each (has no administration in time range)  iohexol  (OMNIPAQUE ) 300 MG/ML solution 75 mL (75 mLs Intravenous Contrast Given 09/25/23 2055)  lactated ringers  bolus 1,000 mL (0 mLs Intravenous Stopped Oct 03, 2023 0038)  ipratropium (ATROVENT ) 0.02 % nebulizer solution (1 mg  Given 09/25/23 2317)  albuterol  (PROVENTIL ) (2.5 MG/3ML) 0.083% nebulizer solution (10 mg  Given 09/25/23 2317)  LORazepam  (ATIVAN ) injection 0.5 mg (0.5 mg Intravenous Given 09/25/23 2333)  LORazepam   (ATIVAN ) injection 0.5 mg (0.5 mg Intravenous Given 09/25/23 2356)                                                                                                                                     Procedures .Critical Care  Performed by: Albertina Dixon, MD Authorized by: Albertina Dixon, MD   Critical care provider statement:    Critical care time (minutes):  30   Critical care was necessary to treat or prevent imminent or life-threatening deterioration of the following conditions:  Respiratory failure   Critical care was time spent personally by me on the following activities:  Development of treatment plan with patient or surrogate, discussions with consultants, evaluation of patient's response to treatment, examination of patient, ordering and review of laboratory studies, ordering and review of radiographic studies, ordering and performing treatments and interventions, pulse oximetry, re-evaluation of patient's condition and review of old charts   (including critical care time)  Medical Decision Making / ED Course   This patient presents to the ED for concern of altered mental status, this involves an extensive number of treatment options, and is a complaint that carries with it a high risk of complications and morbidity.  The differential diagnosis includes infection, metabolic/toxic encephalopathy, hypoglycemia, malperfusion, hypoxia, trauma or other intracranial process  MDM: Patient seen emergency room for evaluation of altered mental status.  Physical exam reveals a somnolent patient with faint wheezing bilaterally, mild tachycardia but is otherwise unremarkable.  Laboratory evaluation with a hemoglobin of 11.4, CO2 38, ammonia 33, lactic acid normal, chest x-ray with small patchy bilateral pleural effusions.  CT head with contrast, CT chest abdomen pelvis without evidence of acute pathology that would explain patient's somnolence.  A VBG was then obtained that shows some significant  respiratory acidosis with hypercarbia with a pH of 7.19 and a PCO2 of 122.  Patient then started on BiPAP and patient given  continuous albuterol  with Atrovent .  Patient's mental status starting to improve but she became quite panicked when she awoke in a hospital bed with a BiPAP on her face.  Small doses of Ativan  given for anxiolysis.  Will require ICU admission for severe hypercarbia.  Patient admitted   Additional history obtained: -Additional history obtained from multiple family members -External records from outside source obtained and reviewed including: Chart review including previous notes, labs, imaging, consultation notes   Lab Tests: -I ordered, reviewed, and interpreted labs.   The pertinent results include:   Labs Reviewed  COMPREHENSIVE METABOLIC PANEL WITH GFR - Abnormal; Notable for the following components:      Result Value   Chloride 92 (*)    CO2 38 (*)    Glucose, Bld 135 (*)    Albumin 3.2 (*)    All other components within normal limits  CBC - Abnormal; Notable for the following components:   Hemoglobin 11.4 (*)    MCV 101.7 (*)    MCHC 27.6 (*)    All other components within normal limits  LACTIC ACID, PLASMA - Abnormal; Notable for the following components:   Lactic Acid, Venous 0.4 (*)    All other components within normal limits  BLOOD GAS, VENOUS - Abnormal; Notable for the following components:   pH, Ven 7.19 (*)    pCO2, Ven 122 (*)    pO2, Ven 64 (*)    Bicarbonate 47.0 (*)    Acid-Base Excess 13.4 (*)    All other components within normal limits  CBG MONITORING, ED - Abnormal; Notable for the following components:   Glucose-Capillary 140 (*)    All other components within normal limits  MRSA NEXT GEN BY PCR, NASAL  AMMONIA  LACTIC ACID, PLASMA  URINALYSIS, ROUTINE W REFLEX MICROSCOPIC  RAPID URINE DRUG SCREEN, HOSP PERFORMED         Imaging Studies ordered: I ordered imaging studies including CT head, chest abdomen pelvis, chest  x-ray I independently visualized and interpreted imaging. I agree with the radiologist interpretation   Medicines ordered and prescription drug management: Meds ordered this encounter  Medications   iohexol  (OMNIPAQUE ) 300 MG/ML solution 75 mL   lactated ringers  bolus 1,000 mL   iohexol  (OMNIPAQUE ) 300 MG/ML solution 100 mL   ipratropium (ATROVENT ) 0.02 % nebulizer solution    Smith, Bridget S: cabinet override   albuterol  (PROVENTIL ) (2.5 MG/3ML) 0.083% nebulizer solution    Claudene Bristle S: cabinet override   LORazepam  (ATIVAN ) injection 0.5 mg   LORazepam  (ATIVAN ) injection 0.5 mg   Chlorhexidine  Gluconate Cloth 2 % PADS 6 each    -I have reviewed the patients home medicines and have made adjustments as needed  Critical interventions BiPAP, continuous albuterol     Cardiac Monitoring: The patient was maintained on a cardiac monitor.  I personally viewed and interpreted the cardiac monitored which showed an underlying rhythm of: Sinus tachycardia  Social Determinants of Health:  Factors impacting patients care include: none   Reevaluation: After the interventions noted above, I reevaluated the patient and found that they have :improved  Co morbidities that complicate the patient evaluation  Past Medical History:  Diagnosis Date   Anemia    Aortic stenosis    Arthritis    Cancer (HCC)    lung cancer 2019   Coronary artery calcification seen on CT scan    Dyspnea    Essential hypertension    GERD (gastroesophageal reflux disease)    History of lung  cancer    Stage III adenocarcinoma status post chemoradiation   Port-A-Cath in place 01/06/2019   Type 2 diabetes mellitus (HCC)       Dispostion: I considered admission for this patient, and patient require hospital admission for hypercarbic respiratory failure     Final Clinical Impression(s) / ED Diagnoses Final diagnoses:  Altered mental status, unspecified altered mental status type  Hypercarbia      @PCDICTATION @    Albertina Dixon, MD 10-08-23 0140

## 2023-09-25 NOTE — Patient Instructions (Signed)
 [facility] - A DEPT OF [facility]  Discharge Instructions: Thank you for choosing [facility] to provide your oncology and hematology care.  If you have a lab appointment with the Cancer Center - please note that after April 8th, 2024, all labs will be drawn in the cancer center.  You do not have to check in or register with the main entrance as you have in the past but will complete your check-in in the cancer center.  Wear comfortable clothing and clothing appropriate for easy access to any Portacath or PICC line.   We strive to give you quality time with your provider. You may need to reschedule your appointment if you arrive late (15 or more minutes).  Arriving late affects you and other patients whose appointments are after yours.  Also, if you miss three or more appointments without notifying the office, you may be dismissed from the clinic at the provider's discretion.      For prescription refill requests, have your pharmacy contact our office and allow 72 hours for refills to be completed.    Today you received the following:  Zofran .  Ondansetron  Injection What is this medication? ONDANSETRON  (on DAN se tron) prevents nausea and vomiting from chemotherapy, radiation, or surgery. It works by blocking substances in the body that may cause nausea or vomiting. It belongs to a class of medications called antiemetics. This medicine may be used for other purposes; ask your health care provider or pharmacist if you have questions. COMMON BRAND NAME(S): Zofran , Zofran  in Dextrose , Zofran  Solution What should I tell my care team before I take this medication? They need to know if you have any of these conditions: Heart disease History of irregular heartbeat Liver disease Low levels of magnesium  or potassium in the blood An unusual or allergic reaction to ondansetron , granisetron, other medications, foods, dyes, or preservatives Pregnant or  trying to get pregnant Breast-feeding How should I use this medication? This medication is injected into a vein. It is given by your care team in a hospital or clinic setting. Talk to your care team about the use of this medication in children. Special care may be needed. Overdosage: If you think you have taken too much of this medicine contact a poison control center or emergency room at once. NOTE: This medicine is only for you. Do not share this medicine with others. What if I miss a dose? This does not apply. What may interact with this medication? Do not take this medication with any of the following: Apomorphine Certain medications for fungal infections, such as fluconazole , itraconazole, ketoconazole, posaconazole, voriconazole Cisapride Dronedarone Pimozide Thioridazine This medication may also interact with the following: Carbamazepine Certain medications for depression, anxiety, or mental health conditions Fentanyl  Linezolid MAOIs, such as Carbex, Eldepryl, Marplan, Nardil, and Parnate Methylene blue (injected into a vein) Other medications that cause heart rhythm changes, such as dofetilide, ziprasidone Phenytoin Rifampicin Tramadol This list may not describe all possible interactions. Give your health care provider a list of all the medicines, herbs, non-prescription drugs, or dietary supplements you use. Also tell them if you smoke, drink alcohol , or use illegal drugs. Some items may interact with your medicine. What should I watch for while using this medication? Your condition will be monitored carefully while you are receiving this medication. What side effects may I notice from receiving this medication? Side effects that you should report to your care team as soon as possible:  Allergic reactions--skin rash, itching, hives, swelling of the face, lips, tongue, or throat Bowel blockage--stomach cramping, unable to have a bowel movement or pass gas, loss of appetite,  vomiting Chest pain (angina)--pain, pressure, or tightness in the chest, neck, back, or arms Heart rhythm changes--fast or irregular heartbeat, dizziness, feeling faint or lightheaded, chest pain, trouble breathing Irritability, confusion, fast or irregular heartbeat, muscle stiffness, twitching muscles, sweating, high fever, seizure, chills, vomiting, diarrhea, which may be signs of serotonin syndrome Side effects that usually do not require medical attention (report to your care team if they continue or are bothersome): Constipation Diarrhea General discomfort and fatigue Headache This list may not describe all possible side effects. Call your doctor for medical advice about side effects. You may report side effects to FDA at 1-800-FDA-1088. Where should I keep my medication? This medication is given in a hospital or clinic and will not be stored at home. NOTE: This sheet is a summary. It may not cover all possible information. If you have questions about this medicine, talk to your doctor, pharmacist, or health care provider.  2024 Elsevier/Gold Standard (2021-08-04 00:00:00)    To help prevent nausea and vomiting after your treatment, we encourage you to take your nausea medication as directed.  BELOW ARE SYMPTOMS THAT SHOULD BE REPORTED IMMEDIATELY: *FEVER GREATER THAN 100.4 F (38 C) OR HIGHER *CHILLS OR SWEATING *NAUSEA AND VOMITING THAT IS NOT CONTROLLED WITH YOUR NAUSEA MEDICATION *UNUSUAL SHORTNESS OF BREATH *UNUSUAL BRUISING OR BLEEDING *URINARY PROBLEMS (pain or burning when urinating, or frequent urination) *BOWEL PROBLEMS (unusual diarrhea, constipation, pain near the anus) TENDERNESS IN MOUTH AND THROAT WITH OR WITHOUT PRESENCE OF ULCERS (sore throat, sores in mouth, or a toothache) UNUSUAL RASH, SWELLING OR PAIN  UNUSUAL VAGINAL DISCHARGE OR ITCHING   Items with * indicate a potential emergency and should be followed up as soon as possible or go to the Emergency  Department if any problems should occur.  Please show the CHEMOTHERAPY ALERT CARD or IMMUNOTHERAPY ALERT CARD at check-in to the Emergency Department and triage nurse.  Should you have questions after your visit or need to cancel or reschedule your appointment, please contact [facility] - A DEPT OF [facility] [phone]  and follow the prompts.  Office hours are 8:00 a.m. to 4:30 p.m. Monday - Friday. Please note that voicemails left after 4:00 p.m. may not be returned until the following business day.  We are closed weekends and major holidays. You have access to a nurse at all times for urgent questions. Please call the main number to the clinic [phone] and follow the prompts.  For any non-urgent questions, you may also contact your provider using MyChart. We now offer e-Visits for anyone 50 and older to request care online for non-urgent symptoms. For details visit [email or web address].   Also download the MyChart app! Go to the app store, search MyChart, open the app, select [facility], and log in with your MyChart username and password.

## 2023-09-25 NOTE — Patient Instructions (Signed)
 [facility] at [facility] Discharge Instructions   You were seen and examined today by Dr. Rogers.  He reviewed the results of your lab work which are normal/stable.   He reviewed the results of the MRI of your liver which shows the tumor there has grown slightly.   We will hold your treatment today. Dr. MARLA will reach out to interventional radiology to see if they can do a spot treatment to the place on the liver.   Take a nausea pill about 30 minutes prior to eating to help you from getting sick.   Return as scheduled.    Thank you for choosing [facility] at [facility] to provide your oncology and hematology care.  To afford each patient quality time with our provider, please arrive at least 15 minutes before your scheduled appointment time.   If you have a lab appointment with the Cancer Center please come in thru the Main Entrance and check in at the main information desk.  You need to re-schedule your appointment should you arrive 10 or more minutes late.  We strive to give you quality time with our providers, and arriving late affects you and other patients whose appointments are after yours.  Also, if you no show three or more times for appointments you may be dismissed from the clinic at the providers discretion.     Again, thank you for choosing [facility].  Our hope is that these requests will decrease the amount of time that you wait before being seen by our physicians.       _____________________________________________________________  Should you have questions after your visit to [facility], please contact our office at [phone] and follow the prompts.  Our office hours are 8:00 a.m. and 4:30 p.m. Monday - Friday.  Please note that voicemails left after 4:00 p.m. may not be returned until the following business day.  We are closed weekends and major holidays.  You do have access to a nurse 24-7,  just call the main number to the clinic [phone] and do not press any options, hold on the line and a nurse will answer the phone.    For prescription refill requests, have your pharmacy contact our office and allow 72 hours.    Due to Covid, you will need to wear a mask upon entering the hospital. If you do not have a mask, a mask will be given to you at the Main Entrance upon arrival. For doctor visits, patients may have 1 support person age 7 or older with them. For treatment visits, patients can not have anyone with them due to social distancing guidelines and our immunocompromised population.

## 2023-09-25 NOTE — ED Notes (Signed)
 RT at bedside.

## 2023-09-25 NOTE — ED Notes (Signed)
 Patient transported to CT

## 2023-09-25 NOTE — Progress Notes (Signed)
 [facility] [address] [place] [address]    Clinic Day:  09/25/2023  Referring physician: Leigh Lung, MD  Patient Care Team: Leigh Lung, MD as PCP - General (Family Medicine) Debera Jayson MATSU, MD as PCP - Cardiology (Cardiology) Rogers Hai, MD as Medical Oncologist (Medical Oncology) Darlean Ozell NOVAK, MD as Consulting Physician (Pulmonary Disease)   ASSESSMENT & PLAN:   Assessment: 1.  Adenocarcinoma of left lung (HCC) -Chemoradiation therapy with carboplatin  and paclitaxel  from 01/07/2019 through 02/11/2019. -Consolidation immunotherapy with durvalumab  from 03/19/2019 through 04/15/2018, held due to pneumonitis. -CT chest on 07/02/2019 shows left upper lobe lung mass measuring 2.6 x 2.0 cm.  It shows improvement in size. -MRI of the brain on 07/18/2019 showed left sphenoid wing meningioma measuring 2.6 x 2.0 x 2.6 cm unchanged.  No new enhancing intracranial lesion.  Increased left temporal white matter edema. -Durvalumab  restarted on 07/24/2019. -Denise [name] was evaluated by cardiology with a stress test.  EF was 46%.  Denise [name] underwent cardiac catheterization which did not show any abnormalities. - 1 year of durvalumab  completed on 07/23/2020. - Liver mass biopsy (08/08/2021): Adenocarcinoma, CK7 positive, negative for TTF-1, Napsin a, GATA3, ER, CK20, CDX2 - PET scan (08/25/2021): Right upper lobe lung nodule 9 mm, SUV 5.0.  Groundglass and solid nodule periphery of the right upper lobe 8 mm, SUV 2.45.  Right lobe liver mass 4.5 x 3.6 cm, SUV 7.78. - NGS testing: PD-L1 negative, TMB-low, MSI-stable, K-ras G12 D.  No other targetable mutations. - Carboplatin , pemetrexed  and pembrolizumab  4 cycles from 09/22/2021 through 11/25/2021, followed by maintenance pemetrexed  and pembrolizumab .  Last pemetrexed  and pembrolizumab  on 11/30/2022.  Pembrolizumab  discontinued due to colitis. -  CT CAP (11/10/2021): Reduced size of liver mass and interval resolution of previously seen  groundglass pulmonary nodules.  There is a new inflammatory right upper lobe nodule.    Plan: 1.  Metastatic adenocarcinoma of the lung to the liver and right lung: - Last pemetrexed  treatment was on 05/21/2023.  We are holding treatment for the last few months due to poor functional status. - CT CAP (09/04/2023): Similar size of hypodense lesion in the right lobe of the liver with ill-defined margin of hyperenhancing soft tissue measuring 4.7 x 4.2 cm, previously 3.9 x 3.7 cm.  No new lesions. - I reviewed MRI of the liver from 09/18/2023: Heterogeneous enhancing mass in the right hepatic lobe measuring 3.9 x 4.3 x 5.9 cm, similar to the prior MRI from 08/05/2021 but has increased from CT scan from March 2025. - Her functional status is not conducive to start back on systemic therapy.  I have recommended evaluation by radiation oncology to see if local treatment options are feasible.  If not we will reach out to IR. - RTC 4 weeks for follow-up.   2.  Meningioma: - MRI brain on 10/02/2022 showed stable left sphenoid wing meningioma.  If Denise [name] has any symptoms, consider repeat imaging.  3.  Unprovoked pulmonary embolism: - Continue Xarelto  20 mg daily indefinitely.  No bleeding issues.   4.  Leg swellings: - Lasix  and potassium supplements are on hold since recent discharge from the hospital.  Denise [name] is wrapping her lower extremities.  Albumin is 2.9.   5.  Normocytic anemia: - Anemia from chronic inflammation.  Hemoglobin is 10.2.   6.  Hypomagnesemia: - Continue magnesium  twice daily.  Magnesium  is normal at 1.9.    Orders Placed This Encounter  Procedures   IR Radiologist Eval &  Mgmt    Standing Status:   Future    Expiration Date:   09/24/2024    Reason for Exam (SYMPTOM  OR DIAGNOSIS REQUIRED):   tx metastatic liver lesion    Preferred Imaging Location?:   [facility]      I,Helena R Teague,acting as a scribe for Alean Stands, MD.,have documented all relevant documentation  on the behalf of Alean Stands, MD,as directed by  Alean Stands, MD while in the presence of Alean Stands, MD.  I, Alean Stands MD, have reviewed the above documentation for accuracy and completeness, and I agree with the above.     Alean Stands, MD   7/8/20254:15 PM  CHIEF COMPLAINT:   Diagnosis: stage IV adenocarcinoma of the left lung    Cancer Staging  Adenocarcinoma of left lung [facility]) Staging form: Lung, AJCC 8th Edition - Clinical stage from 01/02/2019: Stage IIIB (cT3, cN2, cM0) - Signed by Stands Alean, MD on 01/02/2019 - Pathologic stage from 09/07/2021: Stage IVA (pTX, pNX, pM1b) - Unsigned    Prior Therapy: 1. Chemoradiation with carboplatin  and paclitaxel  from 01/07/2019 to 02/11/2019. 2. Consolidation with durvalumab  from 03/19/2019 to 04/16/2019, held due to pneumonitis.  Current Therapy:  Pemetrexed  and pembrolizumab  maintenance    HISTORY OF PRESENT ILLNESS:   Oncology History  Adenocarcinoma of left lung (HCC)  12/09/2018 Initial Diagnosis   Adenocarcinoma of left lung (HCC)   01/02/2019 Cancer Staging   Staging form: Lung, AJCC 8th Edition - Clinical stage from 01/02/2019: Stage IIIB (cT3, cN2, cM0) - Signed by Stands Alean, MD on 01/02/2019   01/07/2019 - 02/11/2019 Chemotherapy   The patient had palonosetron  (ALOXI ) injection 0.25 mg, 0.25 mg, Intravenous,  Once, 6 of 6 cycles Administration: 0.25 mg (01/07/2019), 0.25 mg (01/14/2019), 0.25 mg (01/21/2019), 0.25 mg (01/28/2019), 0.25 mg (02/04/2019), 0.25 mg (02/11/2019) CARBOplatin  (PARAPLATIN ) 270 mg in sodium chloride  0.9 % 250 mL chemo infusion, 270 mg (100 % of original dose 266.4 mg), Intravenous,  Once, 6 of 6 cycles Dose modification:   (original dose 266.4 mg, Cycle 1),   (original dose 266.4 mg, Cycle 2),   (original dose 266.4 mg, Cycle 3),   (original dose 266.4 mg, Cycle 4) Administration: 270 mg (01/07/2019), 270 mg (01/14/2019), 270 mg  (01/21/2019), 270 mg (01/28/2019), 270 mg (02/04/2019), 270 mg (02/11/2019) PACLitaxel  (TAXOL ) 108 mg in sodium chloride  0.9 % 250 mL chemo infusion (</= 80mg /m2), 45 mg/m2 = 108 mg, Intravenous,  Once, 6 of 6 cycles Administration: 108 mg (01/07/2019), 108 mg (01/14/2019), 108 mg (01/21/2019), 108 mg (01/28/2019), 108 mg (02/04/2019), 108 mg (02/11/2019) fosaprepitant  (EMEND) 150 mg, dexamethasone  (DECADRON ) 12 mg in sodium chloride  0.9 % 145 mL IVPB, , Intravenous,  Once, 5 of 5 cycles Administration:  (01/14/2019),  (01/21/2019),  (01/28/2019),  (02/04/2019),  (02/11/2019)  for chemotherapy treatment.    03/19/2019 - 07/23/2020 Chemotherapy   Patient is on Treatment Plan : LUNG DURVALUMAB  Q14D     09/22/2021 - 11/04/2021 Chemotherapy   Patient is on Treatment Plan : LUNG Carboplatin  (5) + Pemetrexed  (500) + Pembrolizumab  (200) D1 q21d Induction x 4 cycles / Maintenance Pemetrexed  (500) + Pembrolizumab  (200) D1 q21d     09/22/2021 -  Chemotherapy   Patient is on Treatment Plan : LUNG Maintenance Alimta  q 21 days        INTERVAL HISTORY:   Denise [name] is a 73 y.o. female presenting to clinic today for follow up of stage IV adenocarcinoma of the left lung. Denise [name] was last seen by  me on 09/11/23.  Since her last visit, Denise [name] underwent MRI liver on 09/18/23 that found: Redemonstration of a 3.9 x 4.3 x 5.9 cm heterogeneously enhancing mass in the right hepatic lobe, which is grossly similar to the prior study from 08/05/2021 and favored to represent metastasis. No new suspicious liver lesion. There is a stable 4 x 5 mm cystic lesion in the pancreatic body, favored to represent pancreatic side-branch IPMN. Bilateral multiloculated pleural effusions, right more than left.  Today, Denise [name] states that Denise [name] is doing well overall. Her appetite level is at 80%. Her energy level is at 50%. Denise [name] is accompanied by a family member.   Denise [name] reports a change in the type of eye drops Denise [name] is prescribed, as her previous eye drops caused  crusting from the eyes.   Her breathing has mildly improved, though Denise [name] has increased nasal cannula from only 2L to switching from 3L to 2L depending on her breathing ability.   Denise [name] is only able to eat one full meal a day as it causes abdominal distention, nausea, and constipation. Denise [name] takes Tums after eating and states Denise [name] will go 2-3 days before having a BM. Denise [name] does not take any medication for nausea prior to eating.   Denise [name] has discontinued lasix  and potassium.   PAST MEDICAL HISTORY:   Past Medical History: Past Medical History:  Diagnosis Date   Anemia    Aortic stenosis    Arthritis    Cancer (HCC)    lung cancer 2019   Coronary artery calcification seen on CT scan    Dyspnea    Essential hypertension    GERD (gastroesophageal reflux disease)    History of lung cancer    Stage III adenocarcinoma status post chemoradiation   Port-A-Cath in place 01/06/2019   Type 2 diabetes mellitus [facility])     Surgical History: Past Surgical History:  Procedure Laterality Date   BIOPSY  09/25/2022   Procedure: BIOPSY;  Surgeon: Cindie Carlin POUR, DO;  [facility];  Service: Endoscopy;;   CHOLECYSTECTOMY  1997   COLONOSCOPY     COLONOSCOPY WITH PROPOFOL  N/A 09/25/2022   Procedure: COLONOSCOPY WITH PROPOFOL ;  Surgeon: Cindie Carlin POUR, DO;  Location: [facility];  Service: Endoscopy;  Laterality: N/A;  11:00;ASA 3   ESOPHAGEAL BRUSHING  09/25/2022   Procedure: ESOPHAGEAL BRUSHING;  Surgeon: Cindie Carlin POUR, DO;  [facility]: [facility];  Service: Endoscopy;;   ESOPHAGOGASTRODUODENOSCOPY (EGD) WITH PROPOFOL  N/A 09/25/2022   Procedure: ESOPHAGOGASTRODUODENOSCOPY (EGD) WITH PROPOFOL ;  Surgeon: Cindie Carlin POUR, DO;  Location: [facility];  Service: Endoscopy;  Laterality: N/A;  11:00AM;ASA 3   GASTRIC BYPASS     INCISIONAL HERNIA REPAIR  04/11/11   IR IMAGING GUIDED PORT INSERTION  12/27/2018   Right   IR US  GUIDE BX ASP/DRAIN  08/08/2021   LAPAROSCOPIC SALPINGOOPHERECTOMY      LAPAROTOMY  04/11/2011   Procedure: EXPLORATORY LAPAROTOMY;  Surgeon: Debby LABOR. Cornett, MD;  Location: [facility];  Service: General;  Laterality: N/A;  closure port hole   POLYPECTOMY  09/25/2022   Procedure: POLYPECTOMY;  Surgeon: Cindie Carlin POUR, DO;  Location: [facility];  Service: Endoscopy;;   RIGHT/LEFT HEART CATH AND CORONARY ANGIOGRAPHY N/A 12/29/2019   Procedure: RIGHT/LEFT HEART CATH AND CORONARY ANGIOGRAPHY;  Surgeon: Burnard Debby LABOR, MD;  Location: [facility];  Service: Cardiovascular;  Laterality: N/A;   TOTAL HIP ARTHROPLASTY  03/07/2012   Procedure: TOTAL HIP ARTHROPLASTY ANTERIOR APPROACH;  Surgeon: Donnice  JONETTA Car, MD;  Location: [facility];  Service: Orthopedics;  Laterality: Right;   TOTAL SHOULDER ARTHROPLASTY Left 01/21/2015   TOTAL SHOULDER ARTHROPLASTY Left 01/21/2015   Procedure: LEFT TOTAL SHOULDER ARTHROPLASTY;  Surgeon: Franky Pointer, MD;  Location: [facility];  Service: Orthopedics;  Laterality: Left;   VAGINAL HYSTERECTOMY      Social History: Social History   Socioeconomic History   Marital status: Single    Spouse name: Not on file   Number of children: Not on file   Years of education: 12th grade   Highest education level: Not on file  Occupational History   Occupation: Employed    Employer: [employer]  Tobacco Use   Smoking status: Former    Current packs/day: 0.00    Average packs/day: 1 pack/day for 12.0 years (12.0 ttl pk-yrs)    Types: Cigarettes    Start date: 03/20/1964    Quit date: 03/20/1976    Years since quitting: 47.5    Passive exposure: Past   Smokeless tobacco: Never  Vaping Use   Vaping status: Never Used  Substance and Sexual Activity   Alcohol  use: No   Drug use: No   Sexual activity: Not Currently  Other Topics Concern   Not on file  Social History Narrative   Not on file   Social Drivers of Health   Financial Resource Strain: Low Risk  (12/06/2018)   Overall Financial Resource Strain (CARDIA)    Difficulty of  Paying Living Expenses: Not very hard  Food Insecurity: No Food Insecurity (06/24/2023)   Hunger Vital Sign    Worried About Running Out of Food in the Last Year: Never true    Ran Out of Food in the Last Year: Never true  Transportation Needs: No Transportation Needs (06/24/2023)   PRAPARE - Administrator, Civil Service (Medical): No    Lack of Transportation (Non-Medical): No  Physical Activity: Inactive (12/06/2018)   Exercise Vital Sign    Days of Exercise per Week: 0 days    Minutes of Exercise per Session: 0 min  Stress: No Stress Concern Present (12/09/2018)   Received from [facility] - Occupational Stress Questionnaire    Feeling of Stress : Not at all  Social Connections: Unknown (06/24/2023)   Social Connection and Isolation Panel    Frequency of Communication with Friends and Family: More than three times a week    Frequency of Social Gatherings with Friends and Family: Once a week    Attends Religious Services: More than 4 times per year    Active Member of [employer] or Organizations: No    Attends Banker Meetings: Never    Marital Status: Patient declined  Recent Concern: Social Connections - Moderately Isolated (06/13/2023)   Social Connection and Isolation Panel    Frequency of Communication with Friends and Family: More than three times a week    Frequency of Social Gatherings with Friends and Family: Once a week    Attends Religious Services: More than 4 times per year    Active Member of [employer] or Organizations: No    Attends Banker Meetings: Never    Marital Status: Never married  Intimate Partner Violence: Not At Risk (06/24/2023)   Humiliation, Afraid, Rape, and Kick questionnaire    Fear of Current or Ex-Partner: No    Emotionally Abused: No    Physically Abused: No    Sexually Abused: No  Family History: Family History  Problem Relation Age of Onset   Breast cancer Mother     COPD Mother    Arthritis Mother    Diabetes Mother    Hypertension Mother    Hypertension Father    Diabetes Father    Breast cancer Sister    Thyroid  cancer Brother    Heart attack Brother    Huntington's disease Maternal Grandmother     Current Medications:  Current Outpatient Medications:    acetaminophen  (TYLENOL ) 500 MG tablet, Take 500 mg by mouth every 6 (six) hours as needed for moderate pain., Disp: , Rfl:    albuterol  (PROVENTIL ) (2.5 MG/3ML) 0.083% nebulizer solution, Take 3 mLs (2.5 mg total) by nebulization every 6 (six) hours as needed for wheezing or shortness of breath., Disp: 75 mL, Rfl: 12   albuterol  (VENTOLIN  HFA) 108 (90 Base) MCG/ACT inhaler, Inhale 2 puffs into the lungs every 6 (six) hours as needed for wheezing or shortness of breath., Disp: 6.7 g, Rfl: 0   ALPRAZolam  (XANAX ) 0.25 MG tablet, Take 0.125 mg by mouth daily., Disp: , Rfl:    Calcium  Carbonate Antacid (TUMS PO), Take 2-3 tablets by mouth daily., Disp: , Rfl:    Cyanocobalamin  (VITAMIN B-12) 2500 MCG SUBL, Place 2,500 mcg under the tongue every morning. , Disp: , Rfl:    diltiazem  (CARDIZEM  CD) 120 MG 24 hr capsule, Take 1 capsule (120 mg total) by mouth daily., Disp: 90 capsule, Rfl: 1   ferrous sulfate  325 (65 FE) MG tablet, Take 325 mg by mouth every evening., Disp: , Rfl:    folic acid  (FOLVITE ) 1 MG tablet, TAKE 1 TABLET BY MOUTH EVERY DAY, Disp: 90 tablet, Rfl: 1   furosemide  (LASIX ) 20 MG tablet, Take 2 tablets (40 mg total) by mouth daily as needed for edema., Disp: 60 tablet, Rfl: 1   gabapentin  (NEURONTIN ) 300 MG capsule, Take 300 mg by mouth at bedtime., Disp: , Rfl:    Glycerin-Hypromellose-PEG 400 (DRY EYE RELIEF DROPS OP), Apply 1 drop to eye daily as needed (dry eyes)., Disp: , Rfl:    lidocaine -prilocaine  (EMLA ) cream, Apply a small amount to port a cath site and cover with plastic wrap 1 hour prior to chemotherapy appointments, Disp: 30 g, Rfl: 3   loteprednol  (LOTEMAX ) 0.5 % ophthalmic  suspension, Place 1 drop into both eyes 2 (two) times daily., Disp: , Rfl:    magnesium  oxide (MAG-OX) 400 MG tablet, Take 1 tablet by mouth 2 (two) times daily., Disp: , Rfl:    midodrine  (PROAMATINE ) 10 MG tablet, Take 1 tablet (10 mg total) by mouth 3 (three) times daily., Disp: 270 tablet, Rfl: 1   Multiple Vitamin (MULITIVITAMIN WITH MINERALS) TABS, Take 1 tablet by mouth daily., Disp: , Rfl:    omeprazole (PRILOSEC) 20 MG capsule, Take 20 mg by mouth daily., Disp: , Rfl:    ondansetron  (ZOFRAN ) 4 MG tablet, Take 1 tablet (4 mg total) by mouth every 8 (eight) hours as needed for nausea or vomiting., Disp: 90 tablet, Rfl: 2   XARELTO  20 MG TABS tablet, TAKE 1 TABLET BY MOUTH DAILY WITH SUPPER., Disp: 30 tablet, Rfl: 3 No current facility-administered medications for this visit.  Facility-Administered Medications Ordered in Other Visits:    magnesium  sulfate 2 GM/50ML IVPB, , , ,    sodium chloride  flush (NS) 0.9 % injection 10 mL, 10 mL, Intravenous, PRN, Cheyane Ayon, MD, 10 mL at 09/25/23 1135   Allergies: No Known Allergies  REVIEW OF  SYSTEMS:   Review of Systems  Constitutional:  Negative for chills, fatigue and fever.  HENT:   Negative for lump/mass, mouth sores, nosebleeds, sore throat and trouble swallowing.   Eyes:  Negative for eye problems.  Respiratory:  Positive for cough and shortness of breath.   Cardiovascular:  Positive for chest pain. Negative for leg swelling and palpitations.  Gastrointestinal:  Positive for constipation, diarrhea, nausea and vomiting. Negative for abdominal pain.  Genitourinary:  Negative for bladder incontinence, difficulty urinating, dysuria, frequency, hematuria and nocturia.   Musculoskeletal:  Negative for arthralgias, back pain, flank pain, myalgias and neck pain.  Skin:  Negative for itching and rash.  Neurological:  Negative for dizziness, headaches and numbness.  Hematological:  Does not bruise/bleed easily.   Psychiatric/Behavioral:  Positive for sleep disturbance. Negative for depression and suicidal ideas. The patient is not nervous/anxious.   All other systems reviewed and are negative.    VITALS:   There were no vitals taken for this visit.  Wt Readings from Last 3 Encounters:  09/20/23 175 lb 3.2 oz (79.5 kg)  09/11/23 177 lb 7.5 oz (80.5 kg)  09/03/23 193 lb (87.5 kg)    There is no height or weight on file to calculate BMI.  Performance status (ECOG): 1 - Symptomatic but completely ambulatory  PHYSICAL EXAM:   Physical Exam Vitals and nursing note reviewed. Exam conducted with a chaperone present.  Constitutional:      Appearance: Normal appearance.  Cardiovascular:     Rate and Rhythm: Normal rate and regular rhythm.     Pulses: Normal pulses.     Heart sounds: Normal heart sounds.  Pulmonary:     Effort: Pulmonary effort is normal.     Breath sounds: Normal breath sounds.  Abdominal:     Palpations: Abdomen is soft. There is no hepatomegaly, splenomegaly or mass.     Tenderness: There is no abdominal tenderness.  Musculoskeletal:     Right lower leg: No edema.     Left lower leg: No edema.  Lymphadenopathy:     Cervical: No cervical adenopathy.     Right cervical: No superficial, deep or posterior cervical adenopathy.    Left cervical: No superficial, deep or posterior cervical adenopathy.     Upper Body:     Right upper body: No supraclavicular or axillary adenopathy.     Left upper body: No supraclavicular or axillary adenopathy.  Neurological:     General: No focal deficit present.     Mental Status: Denise [name] is alert and oriented to person, place, and time.  Psychiatric:        Mood and Affect: Mood normal.        Behavior: Behavior normal.     LABS:   CBC     Component Value Date/Time   WBC 5.5 09/25/2023 1004   RBC 3.77 (L) 09/25/2023 1004   HGB 10.2 (L) 09/25/2023 1004   HCT 36.1 09/25/2023 1004   PLT 277 09/25/2023 1004   MCV 95.8 09/25/2023 1004    MCH 27.1 09/25/2023 1004   MCHC 28.3 (L) 09/25/2023 1004   RDW 14.9 09/25/2023 1004   LYMPHSABS 0.5 (L) 09/25/2023 1004   MONOABS 0.3 09/25/2023 1004   EOSABS 0.0 09/25/2023 1004   BASOSABS 0.0 09/25/2023 1004    CMP      Component Value Date/Time   NA 138 09/25/2023 1004   K 3.7 09/25/2023 1004   CL 90 (L) 09/25/2023 1004   CO2 38 (H) 09/25/2023  1004   GLUCOSE 127 (H) 09/25/2023 1004   BUN 7 (L) 09/25/2023 1004   CREATININE 0.77 09/25/2023 1004   CALCIUM  9.0 09/25/2023 1004   PROT 6.6 09/25/2023 1004   ALBUMIN 2.9 (L) 09/25/2023 1004   AST 15 09/25/2023 1004   ALT 9 09/25/2023 1004   ALKPHOS 83 09/25/2023 1004   BILITOT 0.4 09/25/2023 1004   GFRNONAA >60 09/25/2023 1004   GFRAA >60 12/11/2019 0817     No results found for: CEA1, CEA / No results found for: CEA1, CEA No results found for: PSA1 No results found for: CAN199 No results found for: CAN125  No results found for: TOTALPROTELP, ALBUMINELP, A1GS, A2GS, BETS, BETA2SER, GAMS, MSPIKE, SPEI Lab Results  Component Value Date   TIBC 241 (L) 12/20/2022   TIBC 215 (L) 09/18/2022   TIBC 227 (L) 06/05/2022   FERRITIN 715 (H) 12/20/2022   FERRITIN 596 (H) 09/18/2022   FERRITIN 310 (H) 06/05/2022   IRONPCTSAT 12 12/20/2022   IRONPCTSAT 15 09/18/2022   IRONPCTSAT 17 06/05/2022   Lab Results  Component Value Date   LDH 168 06/25/2023   LDH 178 12/29/2022   LDH 185 12/25/2022     STUDIES:   MR LIVER W WO CONTRAST Result Date: 09/18/2023 CLINICAL DATA:  Liver lesion, > 1cm. Non-small cell lung cancer (NSCLC), monitor. EXAM: MRI ABDOMEN WITHOUT AND WITH CONTRAST TECHNIQUE: Multiplanar multisequence MR imaging of the abdomen was performed both before and after the administration of intravenous contrast. CONTRAST:  7.5mL GADAVIST  GADOBUTROL  1 MMOL/ML IV SOLN COMPARISON:  CT scan chest, abdomen and pelvis from 09/04/2023 and MRI abdomen from 08/05/2021. FINDINGS: Lower chest: Bilateral  multiloculated pleural effusions noted, right more than left. There is cardiomegaly. No pericardial effusion. Hepatobiliary: The liver is normal in size. Noncirrhotic configuration. There are multiple cysts scattered throughout the liver with largest in the left hepatic lobe, segment 2 measuring 1.3 x 1.6 cm. Redemonstration of an approximately 3.9 x 4.3 x 5.9 cm, T2 mildly hyperintense, heterogeneously enhancing mass with central necrotic area in the right hepatic lobe. The lesion appears grossly similar to the prior study from 08/05/2021, but increased since the prior CT scan from 06/04/2023 and grossly similar to the recent study from 09/04/2023. This is favored metastatic in etiology. No new suspicious liver lesion. There is dilation of central intrahepatic and extrahepatic bile ducts, essentially similar to prior studies, most likely secondary to post cholecystectomy status. Correlate clinically. Status post cholecystectomy. Pancreas: There is a stable, T2 hyperintense nonenhancing nonaggressive 4 x 5 mm cystic lesion in the pancreatic body (series 4, image 21). No direct communication with the pancreatic side branch noted. The pancreatic main branch is not dilated. This is favored to represent pancreatic side-branch IPMN. The pancreas is otherwise unremarkable. Spleen:  Within normal limits in size and appearance. No focal mass. Adrenals/Urinary Tract: Unremarkable adrenal glands. No hydroureteronephrosis. No suspicious renal mass. Stomach/Bowel: Visualized portions within the abdomen are unremarkable. No disproportionate dilation of bowel loops. Vascular/Lymphatic: No pathologically enlarged lymph nodes identified. No abdominal aortic aneurysm demonstrated. No ascites. Other:  None. Musculoskeletal: No suspicious bone lesions identified. IMPRESSION: 1. Redemonstration of a 3.9 x 4.3 x 5.9 cm heterogeneously enhancing mass in the right hepatic lobe, which is grossly similar to the prior study from 08/05/2021  and favored to represent metastasis. No new suspicious liver lesion. 2. There is a stable 4 x 5 mm cystic lesion in the pancreatic body, favored to represent pancreatic side-branch IPMN. 3. Bilateral multiloculated pleural effusions,  right more than left. 4. Multiple other nonacute observations, as described above. Electronically Signed   By: Ree Molt M.D.   On: 09/18/2023 16:07   CT CHEST ABDOMEN PELVIS W CONTRAST Result Date: 09/06/2023 CLINICAL DATA:  Metastatic disease evaluation, left lung cancer * Tracking Code: BO * EXAM: CT CHEST, ABDOMEN, AND PELVIS WITH CONTRAST TECHNIQUE: Multidetector CT imaging of the chest, abdomen and pelvis was performed following the standard protocol during bolus administration of intravenous contrast. RADIATION DOSE REDUCTION: This exam was performed according to the departmental dose-optimization program which includes automated exposure control, adjustment of the mA and/or kV according to patient size and/or use of iterative reconstruction technique. CONTRAST:  OMNIPAQUE  IOHEXOL  300 MG/ML  SOLN COMPARISON:  CT chest 06/24/2023, CT chest abdomen pelvis, 06/04/2023 FINDINGS: CT CHEST FINDINGS Cardiovascular: Right chest port catheter. Aortic atherosclerosis. Dense aortic valve calcifications. Cardiomegaly. Unchanged small pericardial effusion. Mediastinum/Nodes: No enlarged mediastinal, hilar, or axillary lymph nodes. Thyroid  gland, trachea, and esophagus demonstrate no significant findings. Lungs/Pleura: Slightly diminished volume of a small, loculated right pleural effusion, with a similar small left pleural effusion. Extensive pleural thickening bilaterally. Unchanged post treatment appearance of the left lung, with extensive, fibrotic consolidation and bronchiectasis of the superior segment (series 4, image 44). Diffuse mosaic attenuation throughout the aerated portions of both lungs. Musculoskeletal: No chest wall abnormality. No acute osseous findings. CT  ABDOMEN PELVIS FINDINGS Hepatobiliary: Similar size of hypodense lesion in the right lobe of the liver measuring 1.9 x 1.5 cm (series 2, image 66), with an ill-defined margin of hyperenhancing soft tissue measuring 4.7 x 4.2 cm, perhaps slightly enlarged compared to prior examination at which time it measured approximately 3.9 x 3.7 cm (series 2, image 66). Additional small hypodense liver lesions unchanged, least 1 of which in the left lobe of the liver is a benign cyst. Status post cholecystectomy. Unchanged postoperative biliary ductal dilatation. Pancreas: Unremarkable. No pancreatic ductal dilatation or surrounding inflammatory changes. Spleen: Normal in size without significant abnormality. Adrenals/Urinary Tract: Adrenal glands are unremarkable. Kidneys are normal, without renal calculi, solid lesion, or hydronephrosis. Bladder is unremarkable. Stomach/Bowel: Roux gastric bypass. Appendix appears normal. No evidence of bowel wall thickening, distention, or inflammatory changes. Pancolonic diverticulosis. Vascular/Lymphatic: Aortic atherosclerosis. No enlarged abdominal or pelvic lymph nodes. Reproductive: Densely calcified uterine fibroid. Other: Midline ventral hernia containing multiple loops of nonobstructed small bowel. No ascites. Musculoskeletal: No acute osseous findings. Status post right hip total arthroplasty. IMPRESSION: 1. Unchanged post treatment appearance of the left lung, with extensive, fibrotic consolidation and bronchiectasis of the superior segment. 2. Slightly diminished volume of a small, loculated right pleural effusion, with a similar small left pleural effusion. Extensive pleural thickening bilaterally. 3. Similar size of hypodense lesion in the right lobe of the liver with an ill-defined margin of hyperenhancing soft tissue measuring 4.7 x 4.2 cm, perhaps slightly enlarged compared to prior examination at which time it measured approximately 3.9 x 3.7 cm. This is suspicious for an  enlarging hepatic metastasis. Contrast enhanced MRI may be helpful adjunct to more clearly assess for burden and change in hepatic metastatic disease. 4. Cardiomegaly and dense aortic valve calcifications. Correlate for echocardiographic evidence of aortic valve dysfunction. 5. Other chronic and incidental findings as above. Aortic Atherosclerosis (ICD10-I70.0). Electronically Signed   By: Marolyn JONETTA Jaksch M.D.   On: 09/06/2023 07:52

## 2023-09-25 NOTE — Progress Notes (Signed)
 Hold treatment today per Dr. Katragadda.  We will give Zofran  4 mg IV x one dose today for nausea per MD.  Patients port flushed without difficulty.  Good blood return noted with no bruising or swelling noted at site.  Band aid applied.  VSS with discharge and left in satisfactory condition with no s/s of distress noted.

## 2023-09-25 NOTE — ED Notes (Signed)
 X-ray at bedside

## 2023-09-25 NOTE — ED Notes (Signed)
 Pt put in soft restraints d/t pt pulling at bi-pap per EDP request. EDP to put in orders

## 2023-09-26 ENCOUNTER — Other Ambulatory Visit: Payer: Self-pay | Admitting: Hematology

## 2023-09-26 ENCOUNTER — Encounter (HOSPITAL_COMMUNITY): Payer: Self-pay | Admitting: Family Medicine

## 2023-09-26 DIAGNOSIS — Z96641 Presence of right artificial hip joint: Secondary | ICD-10-CM | POA: Diagnosis present

## 2023-09-26 DIAGNOSIS — I480 Paroxysmal atrial fibrillation: Secondary | ICD-10-CM | POA: Diagnosis present

## 2023-09-26 DIAGNOSIS — Z66 Do not resuscitate: Secondary | ICD-10-CM | POA: Diagnosis present

## 2023-09-26 DIAGNOSIS — J9622 Acute and chronic respiratory failure with hypercapnia: Secondary | ICD-10-CM | POA: Diagnosis not present

## 2023-09-26 DIAGNOSIS — E119 Type 2 diabetes mellitus without complications: Secondary | ICD-10-CM | POA: Diagnosis present

## 2023-09-26 DIAGNOSIS — Z9049 Acquired absence of other specified parts of digestive tract: Secondary | ICD-10-CM | POA: Diagnosis not present

## 2023-09-26 DIAGNOSIS — C787 Secondary malignant neoplasm of liver and intrahepatic bile duct: Secondary | ICD-10-CM | POA: Diagnosis present

## 2023-09-26 DIAGNOSIS — I2510 Atherosclerotic heart disease of native coronary artery without angina pectoris: Secondary | ICD-10-CM | POA: Diagnosis present

## 2023-09-26 DIAGNOSIS — E8729 Other acidosis: Secondary | ICD-10-CM | POA: Diagnosis present

## 2023-09-26 DIAGNOSIS — Z8261 Family history of arthritis: Secondary | ICD-10-CM | POA: Diagnosis not present

## 2023-09-26 DIAGNOSIS — Z7901 Long term (current) use of anticoagulants: Secondary | ICD-10-CM | POA: Diagnosis not present

## 2023-09-26 DIAGNOSIS — R4182 Altered mental status, unspecified: Secondary | ICD-10-CM | POA: Diagnosis present

## 2023-09-26 DIAGNOSIS — Z79899 Other long term (current) drug therapy: Secondary | ICD-10-CM | POA: Diagnosis not present

## 2023-09-26 DIAGNOSIS — Z825 Family history of asthma and other chronic lower respiratory diseases: Secondary | ICD-10-CM | POA: Diagnosis not present

## 2023-09-26 DIAGNOSIS — J9621 Acute and chronic respiratory failure with hypoxia: Secondary | ICD-10-CM | POA: Diagnosis present

## 2023-09-26 DIAGNOSIS — I9589 Other hypotension: Secondary | ICD-10-CM | POA: Diagnosis present

## 2023-09-26 DIAGNOSIS — G9340 Encephalopathy, unspecified: Secondary | ICD-10-CM | POA: Diagnosis present

## 2023-09-26 DIAGNOSIS — Z515 Encounter for palliative care: Secondary | ICD-10-CM | POA: Diagnosis not present

## 2023-09-26 DIAGNOSIS — G9349 Other encephalopathy: Secondary | ICD-10-CM | POA: Diagnosis present

## 2023-09-26 DIAGNOSIS — I10 Essential (primary) hypertension: Secondary | ICD-10-CM | POA: Diagnosis present

## 2023-09-26 DIAGNOSIS — C3492 Malignant neoplasm of unspecified part of left bronchus or lung: Secondary | ICD-10-CM | POA: Diagnosis present

## 2023-09-26 DIAGNOSIS — C7931 Secondary malignant neoplasm of brain: Secondary | ICD-10-CM | POA: Diagnosis present

## 2023-09-26 DIAGNOSIS — Z96612 Presence of left artificial shoulder joint: Secondary | ICD-10-CM | POA: Diagnosis present

## 2023-09-26 DIAGNOSIS — Z803 Family history of malignant neoplasm of breast: Secondary | ICD-10-CM | POA: Diagnosis not present

## 2023-09-26 DIAGNOSIS — Z808 Family history of malignant neoplasm of other organs or systems: Secondary | ICD-10-CM | POA: Diagnosis not present

## 2023-09-26 DIAGNOSIS — K219 Gastro-esophageal reflux disease without esophagitis: Secondary | ICD-10-CM | POA: Diagnosis present

## 2023-09-26 LAB — BASIC METABOLIC PANEL WITH GFR
Anion gap: 11 (ref 5–15)
BUN: 8 mg/dL (ref 8–23)
CO2: 39 mmol/L — ABNORMAL HIGH (ref 22–32)
Calcium: 9.3 mg/dL (ref 8.9–10.3)
Chloride: 91 mmol/L — ABNORMAL LOW (ref 98–111)
Creatinine, Ser: 0.79 mg/dL (ref 0.44–1.00)
GFR, Estimated: >60 mL/min (ref >60)
Glucose, Bld: 124 mg/dL — ABNORMAL HIGH (ref 70–99)
Potassium: 4 mmol/L (ref 3.5–5.1)
Sodium: 141 mmol/L (ref 135–145)

## 2023-09-26 LAB — CBC
HCT: 38.7 % (ref 36.0–46.0)
Hemoglobin: 10.1 g/dL — ABNORMAL LOW (ref 12.0–15.0)
MCH: 27.1 pg (ref 26.0–34.0)
MCHC: 26.1 g/dL — ABNORMAL LOW (ref 30.0–36.0)
MCV: 103.8 fL — ABNORMAL HIGH (ref 80.0–100.0)
Platelets: 255 K/uL (ref 150–400)
RBC: 3.73 MIL/uL — ABNORMAL LOW (ref 3.87–5.11)
RDW: 14.7 % (ref 11.5–15.5)
WBC: 7.5 K/uL (ref 4.0–10.5)
nRBC: 0 % (ref 0.0–0.2)

## 2023-09-26 LAB — URINALYSIS, ROUTINE W REFLEX MICROSCOPIC
Bacteria, UA: NONE SEEN
Bilirubin Urine: NEGATIVE
Glucose, UA: NEGATIVE mg/dL
Hgb urine dipstick: NEGATIVE
Ketones, ur: NEGATIVE mg/dL
Leukocytes,Ua: NEGATIVE
Nitrite: NEGATIVE
Protein, ur: 30 mg/dL — AB
Specific Gravity, Urine: 1.015 (ref 1.005–1.030)
pH: 5 (ref 5.0–8.0)

## 2023-09-26 LAB — BLOOD GAS, VENOUS
Acid-Base Excess: 13.4 mmol/L — ABNORMAL HIGH (ref 0.0–2.0)
Acid-Base Excess: 13.5 mmol/L — ABNORMAL HIGH (ref 0.0–2.0)
Bicarbonate: 47 mmol/L — ABNORMAL HIGH (ref 20.0–28.0)
Bicarbonate: 46.1 mmol/L — ABNORMAL HIGH (ref 20.0–28.0)
Drawn by: 71738
Drawn by: 4237
O2 Saturation: 92.9 %
O2 Saturation: 96.6 %
Patient temperature: 36.9
Patient temperature: 36.9
pCO2, Ven: 122 mmHg (ref 44–60)
pCO2, Ven: 109 mmHg (ref 44–60)
pH, Ven: 7.19 — CL (ref 7.25–7.43)
pH, Ven: 7.23 — ABNORMAL LOW (ref 7.25–7.43)
pO2, Ven: 64 mmHg — ABNORMAL HIGH (ref 32–45)
pO2, Ven: 73 mmHg — ABNORMAL HIGH (ref 32–45)

## 2023-09-26 LAB — MRSA NEXT GEN BY PCR, NASAL: MRSA by PCR Next Gen: DETECTED — AB

## 2023-09-26 LAB — RAPID URINE DRUG SCREEN, HOSP PERFORMED
Amphetamines: NOT DETECTED
Barbiturates: NOT DETECTED
Benzodiazepines: POSITIVE — AB
Cocaine: NOT DETECTED
Opiates: NOT DETECTED
Tetrahydrocannabinol: NOT DETECTED

## 2023-09-26 LAB — GLUCOSE, CAPILLARY: Glucose-Capillary: 126 mg/dL — ABNORMAL HIGH (ref 70–99)

## 2023-09-26 MED ORDER — SENNOSIDES-DOCUSATE SODIUM 8.6-50 MG PO TABS
1.0000 | ORAL_TABLET | Freq: Every evening | ORAL | Status: DC | PRN
Start: 2023-09-26 — End: 2023-09-26

## 2023-09-26 MED ORDER — LACTATED RINGERS IV BOLUS
1000.0000 mL | Freq: Once | INTRAVENOUS | Status: AC
  Administered 2023-09-26: 1000 mL via INTRAVENOUS

## 2023-09-26 MED ORDER — LORAZEPAM 2 MG/ML IJ SOLN: 1.0000 mg | INTRAMUSCULAR | Status: DC | PRN

## 2023-09-26 MED ORDER — MIDODRINE HCL 5 MG PO TABS: 10.0000 mg | ORAL_TABLET | Freq: Three times a day (TID) | ORAL | Status: DC

## 2023-09-26 MED ORDER — SODIUM CHLORIDE 0.9% FLUSH
3.0000 mL | Freq: Two times a day (BID) | INTRAVENOUS | Status: DC
  Administered 2023-09-26 (×2): 3 mL via INTRAVENOUS

## 2023-09-26 MED ORDER — CHLORHEXIDINE GLUCONATE CLOTH 2 % EX PADS: 6.0000 | MEDICATED_PAD | Freq: Every day | CUTANEOUS | Status: DC

## 2023-09-26 MED ORDER — ACETAMINOPHEN 650 MG RE SUPP: 650.0000 mg | Freq: Four times a day (QID) | RECTAL | Status: DC | PRN

## 2023-09-26 MED ORDER — LORAZEPAM 1 MG PO TABS: 1.0000 mg | ORAL_TABLET | ORAL | Status: DC | PRN

## 2023-09-26 MED ORDER — LOTEPREDNOL ETABONATE 0.5 % OP SUSP
1.0000 [drp] | Freq: Two times a day (BID) | OPHTHALMIC | Status: DC
  Filled 2023-09-26: qty 5

## 2023-09-26 MED ORDER — PROCHLORPERAZINE EDISYLATE 10 MG/2ML IJ SOLN: 5.0000 mg | Freq: Four times a day (QID) | INTRAMUSCULAR | Status: DC | PRN

## 2023-09-26 MED ORDER — PANTOPRAZOLE SODIUM 40 MG PO TBEC: 40.0000 mg | DELAYED_RELEASE_TABLET | Freq: Every day | ORAL | Status: DC

## 2023-09-26 MED ORDER — ACETAMINOPHEN 325 MG PO TABS: 650.0000 mg | ORAL_TABLET | Freq: Four times a day (QID) | ORAL | Status: DC | PRN

## 2023-09-26 MED ORDER — LORAZEPAM 2 MG/ML PO CONC: 1.0000 mg | ORAL | Status: DC | PRN

## 2023-09-26 MED ORDER — HALOPERIDOL LACTATE 5 MG/ML IJ SOLN
5.0000 mg | Freq: Once | INTRAMUSCULAR | Status: AC | PRN
Start: 2023-09-26 — End: 2023-09-26
  Administered 2023-09-26: 5 mg via INTRAVENOUS
  Filled 2023-09-26: qty 1

## 2023-09-26 MED ORDER — HALOPERIDOL LACTATE 5 MG/ML IJ SOLN: 5.0000 mg | Freq: Four times a day (QID) | INTRAMUSCULAR | Status: DC | PRN

## 2023-09-26 MED ORDER — MORPHINE SULFATE (PF) 2 MG/ML IV SOLN
1.0000 mg | INTRAVENOUS | Status: DC | PRN
  Administered 2023-09-26 (×2): 2 mg via INTRAVENOUS
  Filled 2023-09-26 (×2): qty 1

## 2023-09-26 MED ORDER — RIVAROXABAN 20 MG PO TABS: 20.0000 mg | ORAL_TABLET | Freq: Every day | ORAL | Status: DC

## 2023-09-26 MED ORDER — ALBUTEROL SULFATE (2.5 MG/3ML) 0.083% IN NEBU: 2.5000 mg | INHALATION_SOLUTION | RESPIRATORY_TRACT | Status: DC | PRN

## 2023-09-26 MED ORDER — CHLORHEXIDINE GLUCONATE CLOTH 2 % EX PADS
6.0000 | MEDICATED_PAD | Freq: Every day | CUTANEOUS | Status: DC
  Administered 2023-09-26: 6 via TOPICAL

## 2023-09-26 MED ORDER — DILTIAZEM HCL ER COATED BEADS 120 MG PO CP24: 120.0000 mg | ORAL_CAPSULE | Freq: Every day | ORAL | Status: DC

## 2023-10-01 ENCOUNTER — Inpatient Hospital Stay: Admitting: Dietician

## 2023-10-02 ENCOUNTER — Inpatient Hospital Stay

## 2023-10-02 ENCOUNTER — Inpatient Hospital Stay: Admitting: Hematology

## 2023-10-19 NOTE — ED Notes (Signed)
 Pt placed on female purewick d/t AMS and inablility to walk or notify anyone when needs to use the bathroom.  Pt's brief, from home, was dirty. Brief changed and clean one applied

## 2023-10-19 NOTE — Progress Notes (Signed)
 Late entry: Saw patient early this morning due to concerns and mental status.  Patient appears to have worsened overnight and is less responsive.  Discussed with medical power of attorney, her niece (elected by other family members) my concerns.  Niece states that patient has been declining in functional status for last 2 years.  Niece also states that patient would not want to be kept on BiPAP nor intubated with compressions.  It was discussed with niece to transition patient to comfort care after other family members arrived to hospital.  Came back to see patient and family at bedside (multiple family members).  They are in agreement with transitioning Denise [name] to comfort care.  They do not wish to pursue MRI or any further workup.  Orders placed for comfort care and patient passed peacefully with family at bedside at 12: 17.  Ephraim Reichel DO

## 2023-10-19 NOTE — H&P (Addendum)
 History and Physical    Natasja Niday FMW:[id number] DOB: 01-05-1951 DOA: 09/25/2023  PCP: Leigh Lung, MD   Patient coming from: Home   Chief Complaint: lethargy   HPI: Tiwanda Threats is a 73 y.o. female with medical history significant for adenocarcinoma of the left lung with metastases, history of PE on Xarelto , PAF, and chronic hypotension on midodrine , now presenting with lethargy.  Patient is accompanied by her family who states that she was in her usual state earlier in the day when she saw her oncologist and decided not to pursue further chemotherapy given her decreased functional status.  In the evening, after the patient had been alone for a few hours, she was found to be very lethargic.  They were having difficulty waking her up and getting her to answer questions.  ED Course: Upon arrival to the ED, patient is found to be afebrile and saturating mid 90s on 3 L/min supplemental oxygen .  There were no acute findings noted on head CT or CT of the chest, abdomen, and pelvis.  Labs are most notable for serum bicarbonate 38, normal creatinine, normal WBC, normal lactic acid, venous pH 7.19, and venous PCO of 122.  Patient was treated with 1 L LR, Ativan , and BiPAP in the ED.  Review of Systems:  ROS limited by patient's clinical condition.  Past Medical History:  Diagnosis Date   Anemia    Aortic stenosis    Arthritis    Cancer (HCC)    lung cancer 2019   Coronary artery calcification seen on CT scan    Dyspnea    Essential hypertension    GERD (gastroesophageal reflux disease)    History of lung cancer    Stage III adenocarcinoma status post chemoradiation   Port-A-Cath in place 01/06/2019   Type 2 diabetes mellitus [facility])     Past Surgical History:  Procedure Laterality Date   BIOPSY  09/25/2022   Procedure: BIOPSY;  Surgeon: Cindie Carlin POUR, DO;  Location: [facility];  Service: Endoscopy;;   CHOLECYSTECTOMY  1997   COLONOSCOPY     COLONOSCOPY WITH PROPOFOL  N/A  09/25/2022   Procedure: COLONOSCOPY WITH PROPOFOL ;  Surgeon: Cindie Carlin POUR, DO;  Location: [facility];  Service: Endoscopy;  Laterality: N/A;  11:00;ASA 3   ESOPHAGEAL BRUSHING  09/25/2022   Procedure: ESOPHAGEAL BRUSHING;  Surgeon: Cindie Carlin POUR, DO;  [facility]: [facility];  Service: Endoscopy;;   ESOPHAGOGASTRODUODENOSCOPY (EGD) WITH PROPOFOL  N/A 09/25/2022   Procedure: ESOPHAGOGASTRODUODENOSCOPY (EGD) WITH PROPOFOL ;  Surgeon: Cindie Carlin POUR, DO;  Location: [facility];  Service: Endoscopy;  Laterality: N/A;  11:00AM;ASA 3   GASTRIC BYPASS     INCISIONAL HERNIA REPAIR  04/11/11   IR IMAGING GUIDED PORT INSERTION  12/27/2018   Right   IR US  GUIDE BX ASP/DRAIN  08/08/2021   LAPAROSCOPIC SALPINGOOPHERECTOMY     LAPAROTOMY  04/11/2011   Procedure: EXPLORATORY LAPAROTOMY;  Surgeon: Debby LABOR. Cornett, MD;  Location: [facility];  Service: General;  Laterality: N/A;  closure port hole   POLYPECTOMY  09/25/2022   Procedure: POLYPECTOMY;  Surgeon: Cindie Carlin POUR, DO;  [facility]: [facility];  Service: Endoscopy;;   RIGHT/LEFT HEART CATH AND CORONARY ANGIOGRAPHY N/A 12/29/2019   Procedure: RIGHT/LEFT HEART CATH AND CORONARY ANGIOGRAPHY;  Surgeon: Burnard Debby LABOR, MD;  Location: [facility];  Service: Cardiovascular;  Laterality: N/A;   TOTAL HIP ARTHROPLASTY  03/07/2012   Procedure: TOTAL HIP ARTHROPLASTY ANTERIOR APPROACH;  Surgeon: Donnice JONETTA Car,  MD;  Location: [facility];  Service: Orthopedics;  Laterality: Right;   TOTAL SHOULDER ARTHROPLASTY Left 01/21/2015   TOTAL SHOULDER ARTHROPLASTY Left 01/21/2015   Procedure: LEFT TOTAL SHOULDER ARTHROPLASTY;  Surgeon: Franky Pointer, MD;  Location: [facility];  Service: Orthopedics;  Laterality: Left;   VAGINAL HYSTERECTOMY      Social History:   reports that she quit smoking about 47 years ago. Her smoking use included cigarettes. She started smoking about 59 years ago. She has a 12 pack-year smoking history. She has been exposed to tobacco smoke.  She has never used smokeless tobacco. She reports that she does not drink alcohol  and does not use drugs.  No Known Allergies  Family History  Problem Relation Age of Onset   Breast cancer Mother    COPD Mother    Arthritis Mother    Diabetes Mother    Hypertension Mother    Hypertension Father    Diabetes Father    Breast cancer Sister    Thyroid  cancer Brother    Heart attack Brother    Huntington's disease Maternal Grandmother      Prior to Admission medications   Medication Sig Start Date End Date Taking? Authorizing Provider  acetaminophen  (TYLENOL ) 500 MG tablet Take 500 mg by mouth every 6 (six) hours as needed for moderate pain.   Yes [provider]  albuterol  (PROVENTIL ) (2.5 MG/3ML) 0.083% nebulizer solution Take 3 mLs (2.5 mg total) by nebulization every 6 (six) hours as needed for wheezing or shortness of breath. 06/07/23  Yes Rogers Hai, MD  albuterol  (VENTOLIN  HFA) 108 (90 Base) MCG/ACT inhaler Inhale 2 puffs into the lungs every 6 (six) hours as needed for wheezing or shortness of breath. 01/02/23  Yes Ghimire, Donalda HERO, MD  ALPRAZolam  (XANAX ) 0.25 MG tablet Take 0.125 mg by mouth daily. 06/29/23  Yes [provider]  Calcium  Carbonate Antacid (TUMS PO) Take 2-3 tablets by mouth daily.   Yes [provider]  Cyanocobalamin  (VITAMIN B-12) 2500 MCG SUBL Place 2,500 mcg under the tongue every morning.    Yes [provider]  diltiazem  (CARDIZEM  CD) 120 MG 24 hr capsule Take 1 capsule (120 mg total) by mouth daily. 08/21/23  Yes Debera Jayson MATSU, MD  ferrous sulfate  325 (65 FE) MG tablet Take 325 mg by mouth every evening.   Yes [provider]  folic acid  (FOLVITE ) 1 MG tablet TAKE 1 TABLET BY MOUTH EVERY DAY 03/19/23  Yes Rogers Hai, MD  furosemide  (LASIX ) 20 MG tablet Take 2 tablets (40 mg total) by mouth daily as needed for edema. 09/03/23  Yes Debera Jayson MATSU, MD  gabapentin  (NEURONTIN ) 300 MG capsule  Take 300 mg by mouth at bedtime. 07/26/23  Yes [provider]  Glycerin-Hypromellose-PEG 400 (DRY EYE RELIEF DROPS OP) Apply 1 drop to eye daily as needed (dry eyes).   Yes [provider]  lidocaine -prilocaine  (EMLA ) cream Apply a small amount to port a cath site and cover with plastic wrap 1 hour prior to chemotherapy appointments 07/02/23  Yes Rogers Hai, MD  loteprednol  (LOTEMAX ) 0.5 % ophthalmic suspension Place 1 drop into both eyes 2 (two) times daily. 05/07/23  Yes [provider]  magnesium  oxide (MAG-OX) 400 MG tablet Take 1 tablet by mouth 2 (two) times daily. 05/22/23  Yes [provider]  midodrine  (PROAMATINE ) 10 MG tablet Take 1 tablet (10 mg total) by mouth 3 (three) times daily. 09/03/23  Yes Debera Jayson MATSU, MD  Multiple Vitamin (MULITIVITAMIN  WITH MINERALS) TABS Take 1 tablet by mouth daily.   Yes [provider]  omeprazole (PRILOSEC) 20 MG capsule Take 20 mg by mouth daily. 11/18/22  Yes [provider]  ondansetron  (ZOFRAN ) 4 MG tablet Take 1 tablet (4 mg total) by mouth every 8 (eight) hours as needed for nausea or vomiting. 09/25/23  Yes Rogers Hai, MD  XARELTO  20 MG TABS tablet TAKE 1 TABLET BY MOUTH DAILY WITH SUPPER. 09/17/23  Yes Rogers Hai, MD    Physical Exam: Vitals:   28-Sep-2023 0022 09-28-23 0022 2023/09/28 0130 September 28, 2023 0145  BP: 102/66  108/64   Pulse: (!) 117  (!) 120 (!) 115  Resp: 15  18 13   Temp:  98 F (36.7 C)    TempSrc:  Axillary    SpO2: 97%  94% 97%  Weight:      Height:        Constitutional: NAD, no pallor or diaphoresis   Eyes: PERTLA, lids and conjunctivae normal ENMT: Mucous membranes are moist. Posterior pharynx clear of any exudate or lesions.   Neck: supple, no masses  Respiratory: no wheezing, no crackles. No accessory muscle use.  Cardiovascular: S1 & S2 heard, regular rate and rhythm. Bilateral lower extremity edema.  Abdomen: No tenderness, soft. Bowel  sounds active.  Musculoskeletal: no clubbing / cyanosis. No joint deformity upper and lower extremities.   Skin: no significant rashes, lesions, ulcers. Warm, dry, well-perfused. Neurologic: CN 2-12 grossly intact. Moving all extremities. Sleeping, wakes to voice but not answering orientation questions before returning to sleep.      Labs and Imaging on Admission: I have personally reviewed following labs and imaging studies  CBC: Recent Labs  Lab 09/25/23 1004 09/25/23 2039  WBC 5.5 6.5  NEUTROABS 4.5  --   HGB 10.2* 11.4*  HCT 36.1 41.3  MCV 95.8 101.7*  PLT 277 246   Basic Metabolic Panel: Recent Labs  Lab 09/25/23 1004 09/25/23 2039  NA 138 140  K 3.7 4.4  CL 90* 92*  CO2 38* 38*  GLUCOSE 127* 135*  BUN 7* 8  CREATININE 0.77 0.94  CALCIUM  9.0 9.3  MG 1.9  --    GFR: Estimated Creatinine Clearance: 52.7 mL/min (by C-G formula based on SCr of 0.94 mg/dL). Liver Function Tests: Recent Labs  Lab 09/25/23 1004 09/25/23 2039  AST 15 21  ALT 9 10  ALKPHOS 83 86  BILITOT 0.4 0.5  PROT 6.6 7.1  ALBUMIN 2.9* 3.2*   No results for input(s): LIPASE, AMYLASE in the last 168 hours. Recent Labs  Lab 09/25/23 2039  AMMONIA 33   Coagulation Profile: No results for input(s): INR, PROTIME in the last 168 hours. Cardiac Enzymes: No results for input(s): CKTOTAL, CKMB, CKMBINDEX, TROPONINI in the last 168 hours. BNP (last 3 results) No results for input(s): PROBNP in the last 8760 hours. HbA1C: No results for input(s): HGBA1C in the last 72 hours. CBG: Recent Labs  Lab 09/25/23 2112  GLUCAP 140*   Lipid Profile: No results for input(s): CHOL, HDL, LDLCALC, TRIG, CHOLHDL, LDLDIRECT in the last 72 hours. Thyroid  Function Tests: No results for input(s): TSH, T4TOTAL, FREET4, T3FREE, THYROIDAB in the last 72 hours. Anemia Panel: No results for input(s): VITAMINB12, FOLATE, FERRITIN, TIBC, IRON, RETICCTPCT in  the last 72 hours. Urine analysis:    Component Value Date/Time   COLORURINE COLORLESS (A) 04/19/2023 0320   APPEARANCEUR CLEAR 04/19/2023 0320   LABSPEC 1.010 04/19/2023 0320   PHURINE 5.0 04/19/2023 0320  GLUCOSEU NEGATIVE 04/19/2023 0320   HGBUR NEGATIVE 04/19/2023 0320   BILIRUBINUR NEGATIVE 04/19/2023 0320   KETONESUR NEGATIVE 04/19/2023 0320   PROTEINUR NEGATIVE 04/19/2023 0320   UROBILINOGEN 1.0 03/05/2012 1200   NITRITE NEGATIVE 04/19/2023 0320   LEUKOCYTESUR NEGATIVE 04/19/2023 0320   Sepsis Labs: @LABRCNTIP (procalcitonin:4,lacticidven:4) )No results found for this or any previous visit (from the past 240 hours).   Radiological Exams on Admission: CT Head W or Wo Contrast Result Date: 09/25/2023 CLINICAL DATA:  Delirium hx brain mets EXAM: CT HEAD WITHOUT AND WITH CONTRAST TECHNIQUE: Contiguous axial images were obtained from the base of the skull through the vertex without and with intravenous contrast. RADIATION DOSE REDUCTION: This exam was performed according to the departmental dose-optimization program which includes automated exposure control, adjustment of the mA and/or kV according to patient size and/or use of iterative reconstruction technique. CONTRAST:  75mL OMNIPAQUE  IOHEXOL  300 MG/ML  SOLN COMPARISON:  MRI head 10/02/2022 FINDINGS: Brain: No evidence of large-territorial acute infarction. No parenchymal hemorrhage. Redemonstration of a grossly stable to possibly minimally increased in size left sphenoid wing 2.8 x 1.5 cm mass consistent with known meningioma. Otherwise no enhancing mass lesion. No extra-axial collection. No mass effect or midline shift. No hydrocephalus. Basilar cisterns are patent. Vascular: No hyperdense vessel. Skull: No acute fracture or focal lesion. Sinuses/Orbits: Paranasal sinuses and mastoid air cells are clear. The orbits are unremarkable. Other: None. IMPRESSION: 1. No acute intracranial abnormality. Please note limited evaluation for  metastases even on the CT head with contrast. Recommend MRI with and without contrast for further evaluation if concern for metastases. 2. Grossly stable to possibly minimally increased in size left sphenoid wing meningioma. Electronically Signed   By: Morgane  Naveau M.D.   On: 09/25/2023 23:09   CT CHEST ABDOMEN PELVIS W CONTRAST Result Date: 09/25/2023 CLINICAL DATA:  Sepsis EXAM: CT CHEST, ABDOMEN, AND PELVIS WITH CONTRAST TECHNIQUE: Multidetector CT imaging of the chest, abdomen and pelvis was performed following the standard protocol during bolus administration of intravenous contrast. RADIATION DOSE REDUCTION: This exam was performed according to the departmental dose-optimization program which includes automated exposure control, adjustment of the mA and/or kV according to patient size and/or use of iterative reconstruction technique. CONTRAST:  75mL OMNIPAQUE  IOHEXOL  300 MG/ML SOLN, <See Chart> OMNIPAQUE  IOHEXOL  300 MG/ML SOLN COMPARISON:  09/04/2023 FINDINGS: CT CHEST FINDINGS Cardiovascular: Cardiomegaly. Aortic valve and mitral valve calcifications. Scattered coronary artery calcifications. No evidence of aortic aneurysm. No central pulmonary embolus. Small pericardial effusion. Mediastinum/Nodes: No mediastinal, hilar, or axillary adenopathy. Trachea and esophagus are unremarkable. Thyroid  unremarkable. Lungs/Pleura: Small loculated right pleural effusion is again noted. Trace left pleural effusion. These are stable since prior study. Post treatment changes again noted in the left lung, unchanged since prior study. Bibasilar atelectasis or scarring, stable. Ground-glass/mosaic attenuation within both lungs, stable. Musculoskeletal: Chest wall soft tissues are unremarkable. Right chest wall Port-A-Cath remains in place, unchanged. No acute bony abnormality. CT ABDOMEN PELVIS FINDINGS Hepatobiliary: Mixed density mass in the right hepatic lobe is again noted with central low-density and surrounding  enhancing rim. This is not significantly changed since prior study. Prior cholecystectomy. Slight associated biliary ductal dilatation, unchanged. Pancreas: No focal abnormality or ductal dilatation. Spleen: No focal abnormality.  Normal size. Adrenals/Urinary Tract: No adrenal abnormality. No focal renal abnormality. No stones or hydronephrosis. Urinary bladder is unremarkable. Stomach/Bowel: Diffuse colonic diverticulosis. No active diverticulitis. Umbilical hernia containing multiple small bowel loops. No bowel obstruction. Postoperative changes from gastric bypass. No complicating feature. Vascular/Lymphatic:  Aortic atherosclerosis. No evidence of aneurysm or adenopathy. Reproductive: Calcified uterine fibroid again noted. No adnexal mass. Other: No free fluid or free air. Musculoskeletal: Prior right hip replacement. No acute bony abnormality. IMPRESSION: Stable chronic post treatment changes within the left lung. Stable mosaic attenuation within both lungs and bibasilar atelectasis or scarring. Cardiomegaly, aortic atherosclerosis. Stable right hepatic lesion/metastasis. Umbilical hernia containing small bowel loops. No bowel obstruction. Diffuse colonic diverticulosis.  No active diverticulitis. Stable loculated right pleural effusion and small left pleural effusion. Electronically Signed   By: Franky Crease M.D.   On: 09/25/2023 23:09   DG Chest Portable 1 View Result Date: 09/25/2023 CLINICAL DATA:  hypoxia EXAM: PORTABLE CHEST 1 VIEW COMPARISON:  CT chest 03/05/2024, chest x-ray 06/27/2023 FINDINGS: Right chest wall Port-A-Cath with tip overlying the superior cavoatrial junction. The heart and mediastinal contours are unchanged. Atherosclerotic plaque. Bilateral patchy airspace opacities. No pulmonary edema. Bilateral trace to small volume bilateral pleural effusions. No pneumothorax. No acute osseous abnormality.  Right shoulder degenerative changes. IMPRESSION: 1. Bilateral patchy airspace opacities  with trace to small volume bilateral pleural effusions. 2.  Aortic Atherosclerosis (ICD10-I70.0). Electronically Signed   By: Morgane  Naveau M.D.   On: 09/25/2023 21:35    EKG: Independently reviewed. Sinus tachycardia, rate 109, RBBB, LAFB, LVH.   Assessment/Plan   1. Acute on chronic respiratory failure with hypoxia and hypercarbia  - Unclear etiology, no acute findings on CT chest or CT head  - Continue BiPAP, as-needed bronchodilators, repeat blood gas in am, consider MRI brain    2. Acute encephalopathy  - No acute findings on head CT, likely due to hypercarbia  - Expand workup if fails to improve as expected with treatment of hypercarbia    3. Metastatic lung cancer  - Followed by Dr. Katragadda, no longer candidate for systemic therapy due to functional status and is being considered for local therapies    4. Hx of PE  - Xarelto     5. PAF  - Xarelto , diltiazem    6. Chronic hypotension  - Midodrine     DVT prophylaxis: Xarelto   Code Status: DNR/DNI per family in ED  Level of Care: Level of care: Stepdown Family Communication: Joen and Dilane updated in ED   Disposition Plan:  Patient is from: Home  Anticipated d/c is to: TBD Anticipated d/c date is: 09/27/23  Patient currently: Pending improved ventilation and mental status  Consults called: None  Admission status: Inpatient     Evalene GORMAN Sprinkles, MD [facility]  10-26-23, 1:59 AM

## 2023-10-19 NOTE — Plan of Care (Signed)

## 2023-10-19 NOTE — ED Notes (Signed)
 RT called to assist in transport to the floor

## 2023-10-19 NOTE — ED Notes (Signed)
 Provider at bedside

## 2023-10-19 NOTE — Progress Notes (Signed)
 Patient not responsive to sternal rub or pain. Eyes cleansed with hot press & assessed. R eye sluggish and L eye not reactive to light or threat. Niece in room. Dr. Vann made aware. RT at bedside adjusting BIPAP.

## 2023-10-19 NOTE — Progress Notes (Signed)
 Time of Death: 1217 Confirmed with 2 Rns.  Dr. Juvenal notified.

## 2023-10-19 NOTE — Death Summary Note (Signed)
 DEATH SUMMARY   Patient Details  Name: Denise [name] MRN: [id number] DOB: 1950-05-27 ERE:[name], Elna, MD Admission/Discharge Information   Admit Date:  10/23/23  Date of Death: Date of Death: 24-Oct-2023  Time of Death: Time of Death: 1215-11-04  Length of Stay: 0     Hospital Diagnoses: Principal Problem:   Acute on chronic respiratory failure with hypoxia and hypercapnia (HCC) Active Problems:   Bilateral pleural effusion   PAF (paroxysmal atrial fibrillation) (HCC)   Pulmonary embolus (HCC)   Adenocarcinoma of left lung (HCC)   Acute encephalopathy   Hospital Course:  Denise [name] is a 73 y.o. female with medical history significant for adenocarcinoma of the left lung with metastases, history of PE on Xarelto , PAF, and chronic hypotension on midodrine , now presenting with lethargy.   Patient is accompanied by her family who states that she was in her usual state earlier in the day when she saw her oncologist and decided not to pursue further chemotherapy given her decreased functional status.  In the evening, after the patient had been alone for a few hours, she was found to be very lethargic.  They were having difficulty waking her up and getting her to answer questions.  Assessment and Plan: Metastatic lung cancer  Saw patient early this morning due to concerns and mental status.  Patient appears to have worsened overnight and is less responsive.  Discussed with medical power of attorney, her niece (elected by other family members) my concerns.  Niece states that patient has been declining in functional status for last 2 years.  Niece also states that patient would not want to be kept on BiPAP nor intubated/chest compressions.  It was discussed with niece to transition patient to comfort care after other family members arrived to hospital.   Came back to see patient and family at bedside (multiple family members).  They are in agreement with transitioning Denise [name] to comfort care.   They do not wish to pursue MRI or any further workup.  Orders placed for comfort care at 10:30 am and patient passed peacefully with family at bedside at 12: 82.    The results of significant diagnostics from this hospitalization (including imaging, microbiology, ancillary and laboratory) are listed below for reference.   Significant Diagnostic Studies: CT Head W or Wo Contrast Result Date: 10-23-23 CLINICAL DATA:  Delirium hx brain mets EXAM: CT HEAD WITHOUT AND WITH CONTRAST TECHNIQUE: Contiguous axial images were obtained from the base of the skull through the vertex without and with intravenous contrast. RADIATION DOSE REDUCTION: This exam was performed according to the departmental dose-optimization program which includes automated exposure control, adjustment of the mA and/or kV according to patient size and/or use of iterative reconstruction technique. CONTRAST:  75mL OMNIPAQUE  IOHEXOL  300 MG/ML  SOLN COMPARISON:  MRI head 10/02/2022 FINDINGS: Brain: No evidence of large-territorial acute infarction. No parenchymal hemorrhage. Redemonstration of a grossly stable to possibly minimally increased in size left sphenoid wing 2.8 x 1.5 cm mass consistent with known meningioma. Otherwise no enhancing mass lesion. No extra-axial collection. No mass effect or midline shift. No hydrocephalus. Basilar cisterns are patent. Vascular: No hyperdense vessel. Skull: No acute fracture or focal lesion. Sinuses/Orbits: Paranasal sinuses and mastoid air cells are clear. The orbits are unremarkable. Other: None. IMPRESSION: 1. No acute intracranial abnormality. Please note limited evaluation for metastases even on the CT head with contrast. Recommend MRI with and without contrast for further evaluation if concern for metastases. 2. Grossly stable to possibly  minimally increased in size left sphenoid wing meningioma. Electronically Signed   By: Denise  [name] M.D.   On: 09/25/2023 23:09   CT CHEST ABDOMEN PELVIS W  CONTRAST Result Date: 09/25/2023 CLINICAL DATA:  Sepsis EXAM: CT CHEST, ABDOMEN, AND PELVIS WITH CONTRAST TECHNIQUE: Multidetector CT imaging of the chest, abdomen and pelvis was performed following the standard protocol during bolus administration of intravenous contrast. RADIATION DOSE REDUCTION: This exam was performed according to the departmental dose-optimization program which includes automated exposure control, adjustment of the mA and/or kV according to patient size and/or use of iterative reconstruction technique. CONTRAST:  75mL OMNIPAQUE  IOHEXOL  300 MG/ML SOLN, <See Chart> OMNIPAQUE  IOHEXOL  300 MG/ML SOLN COMPARISON:  09/04/2023 FINDINGS: CT CHEST FINDINGS Cardiovascular: Cardiomegaly. Aortic valve and mitral valve calcifications. Scattered coronary artery calcifications. No evidence of aortic aneurysm. No central pulmonary embolus. Small pericardial effusion. Mediastinum/Nodes: No mediastinal, hilar, or axillary adenopathy. Trachea and esophagus are unremarkable. Thyroid  unremarkable. Lungs/Pleura: Small loculated right pleural effusion is again noted. Trace left pleural effusion. These are stable since prior study. Post treatment changes again noted in the left lung, unchanged since prior study. Bibasilar atelectasis or scarring, stable. Ground-glass/mosaic attenuation within both lungs, stable. Musculoskeletal: Chest wall soft tissues are unremarkable. Right chest wall Port-A-Cath remains in place, unchanged. No acute bony abnormality. CT ABDOMEN PELVIS FINDINGS Hepatobiliary: Mixed density mass in the right hepatic lobe is again noted with central low-density and surrounding enhancing rim. This is not significantly changed since prior study. Prior cholecystectomy. Slight associated biliary ductal dilatation, unchanged. Pancreas: No focal abnormality or ductal dilatation. Spleen: No focal abnormality.  Normal size. Adrenals/Urinary Tract: No adrenal abnormality. No focal renal abnormality. No stones  or hydronephrosis. Urinary bladder is unremarkable. Stomach/Bowel: Diffuse colonic diverticulosis. No active diverticulitis. Umbilical hernia containing multiple small bowel loops. No bowel obstruction. Postoperative changes from gastric bypass. No complicating feature. Vascular/Lymphatic: Aortic atherosclerosis. No evidence of aneurysm or adenopathy. Reproductive: Calcified uterine fibroid again noted. No adnexal mass. Other: No free fluid or free air. Musculoskeletal: Prior right hip replacement. No acute bony abnormality. IMPRESSION: Stable chronic post treatment changes within the left lung. Stable mosaic attenuation within both lungs and bibasilar atelectasis or scarring. Cardiomegaly, aortic atherosclerosis. Stable right hepatic lesion/metastasis. Umbilical hernia containing small bowel loops. No bowel obstruction. Diffuse colonic diverticulosis.  No active diverticulitis. Stable loculated right pleural effusion and small left pleural effusion. Electronically Signed   By: Franky Crease M.D.   On: 09/25/2023 23:09   DG Chest Portable 1 View Result Date: 09/25/2023 CLINICAL DATA:  hypoxia EXAM: PORTABLE CHEST 1 VIEW COMPARISON:  CT chest 03/05/2024, chest x-ray 06/27/2023 FINDINGS: Right chest wall Port-A-Cath with tip overlying the superior cavoatrial junction. The heart and mediastinal contours are unchanged. Atherosclerotic plaque. Bilateral patchy airspace opacities. No pulmonary edema. Bilateral trace to small volume bilateral pleural effusions. No pneumothorax. No acute osseous abnormality.  Right shoulder degenerative changes. IMPRESSION: 1. Bilateral patchy airspace opacities with trace to small volume bilateral pleural effusions. 2.  Aortic Atherosclerosis (ICD10-I70.0). Electronically Signed   By: Denise  [name] M.D.   On: 09/25/2023 21:35   MR LIVER W WO CONTRAST Result Date: 09/18/2023 CLINICAL DATA:  Liver lesion, > 1cm. Non-small cell lung cancer (NSCLC), monitor. EXAM: MRI ABDOMEN WITHOUT AND  WITH CONTRAST TECHNIQUE: Multiplanar multisequence MR imaging of the abdomen was performed both before and after the administration of intravenous contrast. CONTRAST:  7.5mL GADAVIST  GADOBUTROL  1 MMOL/ML IV SOLN COMPARISON:  CT scan chest, abdomen and pelvis from 09/04/2023 and MRI  abdomen from 08/05/2021. FINDINGS: Lower chest: Bilateral multiloculated pleural effusions noted, right more than left. There is cardiomegaly. No pericardial effusion. Hepatobiliary: The liver is normal in size. Noncirrhotic configuration. There are multiple cysts scattered throughout the liver with largest in the left hepatic lobe, segment 2 measuring 1.3 x 1.6 cm. Redemonstration of an approximately 3.9 x 4.3 x 5.9 cm, T2 mildly hyperintense, heterogeneously enhancing mass with central necrotic area in the right hepatic lobe. The lesion appears grossly similar to the prior study from 08/05/2021, but increased since the prior CT scan from 06/04/2023 and grossly similar to the recent study from 09/04/2023. This is favored metastatic in etiology. No new suspicious liver lesion. There is dilation of central intrahepatic and extrahepatic bile ducts, essentially similar to prior studies, most likely secondary to post cholecystectomy status. Correlate clinically. Status post cholecystectomy. Pancreas: There is a stable, T2 hyperintense nonenhancing nonaggressive 4 x 5 mm cystic lesion in the pancreatic body (series 4, image 21). No direct communication with the pancreatic side branch noted. The pancreatic main branch is not dilated. This is favored to represent pancreatic side-branch IPMN. The pancreas is otherwise unremarkable. Spleen:  Within normal limits in size and appearance. No focal mass. Adrenals/Urinary Tract: Unremarkable adrenal glands. No hydroureteronephrosis. No suspicious renal mass. Stomach/Bowel: Visualized portions within the abdomen are unremarkable. No disproportionate dilation of bowel loops. Vascular/Lymphatic: No  pathologically enlarged lymph nodes identified. No abdominal aortic aneurysm demonstrated. No ascites. Other:  None. Musculoskeletal: No suspicious bone lesions identified. IMPRESSION: 1. Redemonstration of a 3.9 x 4.3 x 5.9 cm heterogeneously enhancing mass in the right hepatic lobe, which is grossly similar to the prior study from 08/05/2021 and favored to represent metastasis. No new suspicious liver lesion. 2. There is a stable 4 x 5 mm cystic lesion in the pancreatic body, favored to represent pancreatic side-branch IPMN. 3. Bilateral multiloculated pleural effusions, right more than left. 4. Multiple other nonacute observations, as described above. Electronically Signed   By: Ree Molt M.D.   On: 09/18/2023 16:07   CT CHEST ABDOMEN PELVIS W CONTRAST Result Date: 09/06/2023 CLINICAL DATA:  Metastatic disease evaluation, left lung cancer * Tracking Code: BO * EXAM: CT CHEST, ABDOMEN, AND PELVIS WITH CONTRAST TECHNIQUE: Multidetector CT imaging of the chest, abdomen and pelvis was performed following the standard protocol during bolus administration of intravenous contrast. RADIATION DOSE REDUCTION: This exam was performed according to the departmental dose-optimization program which includes automated exposure control, adjustment of the mA and/or kV according to patient size and/or use of iterative reconstruction technique. CONTRAST:  OMNIPAQUE  IOHEXOL  300 MG/ML  SOLN COMPARISON:  CT chest 06/24/2023, CT chest abdomen pelvis, 06/04/2023 FINDINGS: CT CHEST FINDINGS Cardiovascular: Right chest port catheter. Aortic atherosclerosis. Dense aortic valve calcifications. Cardiomegaly. Unchanged small pericardial effusion. Mediastinum/Nodes: No enlarged mediastinal, hilar, or axillary lymph nodes. Thyroid  gland, trachea, and esophagus demonstrate no significant findings. Lungs/Pleura: Slightly diminished volume of a small, loculated right pleural effusion, with a similar small left pleural effusion.  Extensive pleural thickening bilaterally. Unchanged post treatment appearance of the left lung, with extensive, fibrotic consolidation and bronchiectasis of the superior segment (series 4, image 44). Diffuse mosaic attenuation throughout the aerated portions of both lungs. Musculoskeletal: No chest wall abnormality. No acute osseous findings. CT ABDOMEN PELVIS FINDINGS Hepatobiliary: Similar size of hypodense lesion in the right lobe of the liver measuring 1.9 x 1.5 cm (series 2, image 66), with an ill-defined margin of hyperenhancing soft tissue measuring 4.7 x 4.2 cm, perhaps slightly enlarged  compared to prior examination at which time it measured approximately 3.9 x 3.7 cm (series 2, image 66). Additional small hypodense liver lesions unchanged, least 1 of which in the left lobe of the liver is a benign cyst. Status post cholecystectomy. Unchanged postoperative biliary ductal dilatation. Pancreas: Unremarkable. No pancreatic ductal dilatation or surrounding inflammatory changes. Spleen: Normal in size without significant abnormality. Adrenals/Urinary Tract: Adrenal glands are unremarkable. Kidneys are normal, without renal calculi, solid lesion, or hydronephrosis. Bladder is unremarkable. Stomach/Bowel: Roux gastric bypass. Appendix appears normal. No evidence of bowel wall thickening, distention, or inflammatory changes. Pancolonic diverticulosis. Vascular/Lymphatic: Aortic atherosclerosis. No enlarged abdominal or pelvic lymph nodes. Reproductive: Densely calcified uterine fibroid. Other: Midline ventral hernia containing multiple loops of nonobstructed small bowel. No ascites. Musculoskeletal: No acute osseous findings. Status post right hip total arthroplasty. IMPRESSION: 1. Unchanged post treatment appearance of the left lung, with extensive, fibrotic consolidation and bronchiectasis of the superior segment. 2. Slightly diminished volume of a small, loculated right pleural effusion, with a similar small  left pleural effusion. Extensive pleural thickening bilaterally. 3. Similar size of hypodense lesion in the right lobe of the liver with an ill-defined margin of hyperenhancing soft tissue measuring 4.7 x 4.2 cm, perhaps slightly enlarged compared to prior examination at which time it measured approximately 3.9 x 3.7 cm. This is suspicious for an enlarging hepatic metastasis. Contrast enhanced MRI may be helpful adjunct to more clearly assess for burden and change in hepatic metastatic disease. 4. Cardiomegaly and dense aortic valve calcifications. Correlate for echocardiographic evidence of aortic valve dysfunction. 5. Other chronic and incidental findings as above. Aortic Atherosclerosis (ICD10-I70.0). Electronically Signed   By: Marolyn JONETTA Jaksch M.D.   On: 09/06/2023 07:52    Microbiology: No results found for this or any previous visit (from the past 240 hours).  Time spent: 55 minutes  Signed: Jordon Kristiansen U Courtne Lighty, DO Sep 30, 2023

## 2023-10-19 NOTE — Accreditation Note (Signed)
 Death within 24 hours of discontinuation of bilateral soft wrist restraints logged on 10/03/2023 at 1333  by Valentin Lai RN

## 2023-10-19 NOTE — Progress Notes (Signed)
 Patient transport from ED room 18 to ICU bed 9 on Servo air BIPAP. VSS throughout and no adverse events noted.

## 2023-10-19 NOTE — Progress Notes (Signed)
 Family informed this Investment banker, operational they are choosing [facility].

## 2023-10-19 NOTE — Progress Notes (Signed)
 Family at bedside. Dr. Juvenal Discussed plan of care. Transitioned to comfort care.

## 2023-10-19 NOTE — TOC CM/SW Note (Signed)
 Transition of Care [facility]) - Inpatient Brief Assessment   Patient Details  Name: Denise [name] MRN: [id number] Date of Birth: 07-03-1950  Transition of Care [facility]) CM/SW Contact:    Hoy DELENA Bigness, LCSW Phone Number: 10-20-2023, 11:25 AM   Clinical Narrative: Pt from home with family. Pt transitioned to comfort care. TOC will follow for final disposition of in hospital death vs hospice.    Transition of Care Asessment: Insurance and Status: Insurance coverage has been reviewed Patient has primary care physician: Yes Home environment has been reviewed: Home w/ family Prior level of function:: Modified independent/ needs assistance Prior/Current Home Services: Current home services Social Drivers of Health Review: SDOH reviewed no interventions necessary Readmission risk has been reviewed: Yes Transition of care needs: transition of care needs identified, TOC will continue to follow

## 2023-10-19 DEATH — deceased

## 2023-10-23 ENCOUNTER — Ambulatory Visit: Admitting: Hematology

## 2023-10-23 ENCOUNTER — Ambulatory Visit

## 2023-10-23 ENCOUNTER — Other Ambulatory Visit
